# Patient Record
Sex: Female | Born: 1960
Health system: Southern US, Community
[De-identification: ages and names within clinical notes are randomized; demographics above are authoritative.]

## PROBLEM LIST (undated history)

## (undated) DIAGNOSIS — R42 Dizziness and giddiness: Secondary | ICD-10-CM

## (undated) DIAGNOSIS — G4700 Insomnia, unspecified: Secondary | ICD-10-CM

## (undated) DIAGNOSIS — F329 Major depressive disorder, single episode, unspecified: Secondary | ICD-10-CM

## (undated) DIAGNOSIS — K589 Irritable bowel syndrome without diarrhea: Secondary | ICD-10-CM

## (undated) DIAGNOSIS — J302 Other seasonal allergic rhinitis: Secondary | ICD-10-CM

## (undated) DIAGNOSIS — I10 Essential (primary) hypertension: Secondary | ICD-10-CM

## (undated) DIAGNOSIS — M797 Fibromyalgia: Secondary | ICD-10-CM

## (undated) DIAGNOSIS — E0590 Thyrotoxicosis, unspecified without thyrotoxic crisis or storm: Secondary | ICD-10-CM

## (undated) DIAGNOSIS — G8929 Other chronic pain: Secondary | ICD-10-CM

## (undated) DIAGNOSIS — S99199A Other physeal fracture of unspecified metatarsal, initial encounter for closed fracture: Secondary | ICD-10-CM

## (undated) DIAGNOSIS — K219 Gastro-esophageal reflux disease without esophagitis: Secondary | ICD-10-CM

## (undated) DIAGNOSIS — Z889 Allergy status to unspecified drugs, medicaments and biological substances status: Secondary | ICD-10-CM

## (undated) DIAGNOSIS — R000 Tachycardia, unspecified: Secondary | ICD-10-CM

## (undated) DIAGNOSIS — J40 Bronchitis, not specified as acute or chronic: Secondary | ICD-10-CM

## (undated) DIAGNOSIS — R413 Other amnesia: Secondary | ICD-10-CM

## (undated) DIAGNOSIS — F419 Anxiety disorder, unspecified: Secondary | ICD-10-CM

## (undated) DIAGNOSIS — Z8719 Personal history of other diseases of the digestive system: Secondary | ICD-10-CM

## (undated) DIAGNOSIS — K056 Periodontal disease, unspecified: Secondary | ICD-10-CM

## (undated) DIAGNOSIS — E049 Nontoxic goiter, unspecified: Secondary | ICD-10-CM

## (undated) DIAGNOSIS — F32A Depression, unspecified: Secondary | ICD-10-CM

## (undated) DIAGNOSIS — M1990 Unspecified osteoarthritis, unspecified site: Secondary | ICD-10-CM

## (undated) DIAGNOSIS — R51 Headache: Secondary | ICD-10-CM

## (undated) DIAGNOSIS — G4730 Sleep apnea, unspecified: Secondary | ICD-10-CM

## (undated) DIAGNOSIS — D649 Anemia, unspecified: Secondary | ICD-10-CM

## (undated) DIAGNOSIS — T8484XA Pain due to internal orthopedic prosthetic devices, implants and grafts, initial encounter: Secondary | ICD-10-CM

## (undated) DIAGNOSIS — T148XXA Other injury of unspecified body region, initial encounter: Secondary | ICD-10-CM

## (undated) DIAGNOSIS — R0602 Shortness of breath: Secondary | ICD-10-CM

## (undated) DIAGNOSIS — S93439A Sprain of tibiofibular ligament of unspecified ankle, initial encounter: Secondary | ICD-10-CM

## (undated) DIAGNOSIS — I8390 Asymptomatic varicose veins of unspecified lower extremity: Secondary | ICD-10-CM

## (undated) DIAGNOSIS — M549 Dorsalgia, unspecified: Secondary | ICD-10-CM

## (undated) DIAGNOSIS — S82831A Other fracture of upper and lower end of right fibula, initial encounter for closed fracture: Secondary | ICD-10-CM

## (undated) DIAGNOSIS — S82301A Unspecified fracture of lower end of right tibia, initial encounter for closed fracture: Secondary | ICD-10-CM

## (undated) HISTORY — DX: Fibromyalgia: M79.7

## (undated) HISTORY — PX: SHOULDER ARTHROSCOPY W/ ROTATOR CUFF REPAIR: SHX2400

## (undated) HISTORY — PX: OTHER SURGICAL HISTORY: SHX169

## (undated) HISTORY — DX: Bronchitis, not specified as acute or chronic: J40

## (undated) HISTORY — PX: CERVICAL DISC SURGERY: SHX588

## (undated) HISTORY — DX: Other seasonal allergic rhinitis: J30.2

## (undated) HISTORY — PX: HERNIA REPAIR: SHX51

## (undated) HISTORY — DX: Other physeal fracture of unspecified metatarsal, initial encounter for closed fracture: S99.199A

## (undated) HISTORY — PX: SHOULDER SURGERY: SHX246

## (undated) HISTORY — PX: TUBAL LIGATION: SHX77

## (undated) HISTORY — PX: PARTIAL HYSTERECTOMY: SHX80

## (undated) HISTORY — DX: Gastro-esophageal reflux disease without esophagitis: K21.9

## (undated) HISTORY — PX: UTERINE FIBROID SURGERY: SHX826

---

## 1998-01-27 ENCOUNTER — Other Ambulatory Visit: Admission: RE | Admit: 1998-01-27 | Discharge: 1998-01-27 | Payer: Self-pay | Admitting: Obstetrics and Gynecology

## 1999-06-07 ENCOUNTER — Other Ambulatory Visit: Admission: RE | Admit: 1999-06-07 | Discharge: 1999-06-07 | Payer: Self-pay | Admitting: Obstetrics and Gynecology

## 1999-10-23 ENCOUNTER — Encounter: Admission: RE | Admit: 1999-10-23 | Discharge: 1999-10-23 | Payer: Self-pay | Admitting: Gastroenterology

## 1999-10-23 ENCOUNTER — Encounter: Payer: Self-pay | Admitting: Gastroenterology

## 2001-02-25 ENCOUNTER — Other Ambulatory Visit: Admission: RE | Admit: 2001-02-25 | Discharge: 2001-02-25 | Payer: Self-pay | Admitting: Obstetrics and Gynecology

## 2002-09-25 ENCOUNTER — Emergency Department (HOSPITAL_COMMUNITY): Admission: EM | Admit: 2002-09-25 | Discharge: 2002-09-25 | Payer: Self-pay | Admitting: Emergency Medicine

## 2002-09-25 ENCOUNTER — Encounter: Payer: Self-pay | Admitting: Emergency Medicine

## 2002-10-07 HISTORY — PX: SPINE SURGERY: SHX786

## 2002-10-08 ENCOUNTER — Other Ambulatory Visit: Admission: RE | Admit: 2002-10-08 | Discharge: 2002-10-08 | Payer: Self-pay | Admitting: Obstetrics and Gynecology

## 2003-02-18 ENCOUNTER — Encounter: Payer: Self-pay | Admitting: Neurosurgery

## 2003-02-18 ENCOUNTER — Ambulatory Visit (HOSPITAL_COMMUNITY): Admission: RE | Admit: 2003-02-18 | Discharge: 2003-02-19 | Payer: Self-pay | Admitting: Neurosurgery

## 2003-10-08 HISTORY — PX: KNEE SURGERY: SHX244

## 2003-11-18 ENCOUNTER — Other Ambulatory Visit: Admission: RE | Admit: 2003-11-18 | Discharge: 2003-11-18 | Payer: Self-pay | Admitting: Obstetrics and Gynecology

## 2003-12-29 ENCOUNTER — Encounter: Admission: RE | Admit: 2003-12-29 | Discharge: 2003-12-29 | Payer: Self-pay | Admitting: Gastroenterology

## 2004-01-09 ENCOUNTER — Encounter: Admission: RE | Admit: 2004-01-09 | Discharge: 2004-01-09 | Payer: Self-pay | Admitting: Gastroenterology

## 2005-10-07 HISTORY — PX: TONSILLECTOMY: SUR1361

## 2005-10-07 HISTORY — PX: NASAL SEPTOPLASTY W/ TURBINOPLASTY: SHX2070

## 2006-03-13 ENCOUNTER — Ambulatory Visit (HOSPITAL_COMMUNITY): Admission: RE | Admit: 2006-03-13 | Discharge: 2006-03-13 | Payer: Self-pay | Admitting: Obstetrics and Gynecology

## 2006-04-25 ENCOUNTER — Encounter (INDEPENDENT_AMBULATORY_CARE_PROVIDER_SITE_OTHER): Payer: Self-pay | Admitting: Specialist

## 2006-04-25 ENCOUNTER — Ambulatory Visit (HOSPITAL_COMMUNITY): Admission: RE | Admit: 2006-04-25 | Discharge: 2006-04-25 | Payer: Self-pay | Admitting: Obstetrics and Gynecology

## 2006-05-05 ENCOUNTER — Encounter (INDEPENDENT_AMBULATORY_CARE_PROVIDER_SITE_OTHER): Payer: Self-pay | Admitting: *Deleted

## 2006-05-05 ENCOUNTER — Other Ambulatory Visit: Admission: RE | Admit: 2006-05-05 | Discharge: 2006-05-05 | Payer: Self-pay | Admitting: Interventional Radiology

## 2006-05-05 ENCOUNTER — Encounter: Admission: RE | Admit: 2006-05-05 | Discharge: 2006-05-05 | Payer: Self-pay | Admitting: Surgery

## 2007-07-20 ENCOUNTER — Ambulatory Visit: Payer: Self-pay

## 2010-03-14 ENCOUNTER — Encounter: Admission: RE | Admit: 2010-03-14 | Discharge: 2010-03-14 | Payer: Self-pay | Admitting: General Practice

## 2010-05-08 ENCOUNTER — Ambulatory Visit (HOSPITAL_BASED_OUTPATIENT_CLINIC_OR_DEPARTMENT_OTHER): Admission: RE | Admit: 2010-05-08 | Discharge: 2010-05-08 | Payer: Self-pay | Admitting: Orthopedic Surgery

## 2010-05-08 HISTORY — PX: FRACTURE SURGERY: SHX138

## 2010-11-26 ENCOUNTER — Other Ambulatory Visit: Payer: Self-pay | Admitting: Orthopedic Surgery

## 2010-11-26 DIAGNOSIS — R609 Edema, unspecified: Secondary | ICD-10-CM

## 2010-11-26 DIAGNOSIS — M79605 Pain in left leg: Secondary | ICD-10-CM

## 2010-11-27 ENCOUNTER — Ambulatory Visit
Admission: RE | Admit: 2010-11-27 | Discharge: 2010-11-27 | Disposition: A | Discharge status: Home / Self Care | Payer: No Typology Code available for payment source | Source: Ambulatory Visit | Attending: Orthopedic Surgery | Admitting: Orthopedic Surgery

## 2010-11-27 DIAGNOSIS — M79605 Pain in left leg: Secondary | ICD-10-CM

## 2010-11-27 DIAGNOSIS — R609 Edema, unspecified: Secondary | ICD-10-CM

## 2010-12-20 ENCOUNTER — Other Ambulatory Visit (HOSPITAL_COMMUNITY): Payer: Self-pay | Admitting: Sports Medicine

## 2010-12-20 DIAGNOSIS — M5126 Other intervertebral disc displacement, lumbar region: Secondary | ICD-10-CM

## 2010-12-21 LAB — POCT HEMOGLOBIN-HEMACUE: Hemoglobin: 12.4 g/dL (ref 12.0–15.0)

## 2010-12-23 ENCOUNTER — Ambulatory Visit (HOSPITAL_COMMUNITY)
Admission: RE | Admit: 2010-12-23 | Discharge: 2010-12-23 | Disposition: A | Discharge status: Home / Self Care | Payer: No Typology Code available for payment source | Source: Ambulatory Visit | Attending: Sports Medicine | Admitting: Sports Medicine

## 2010-12-23 DIAGNOSIS — M545 Low back pain, unspecified: Secondary | ICD-10-CM | POA: Insufficient documentation

## 2010-12-23 DIAGNOSIS — M48061 Spinal stenosis, lumbar region without neurogenic claudication: Secondary | ICD-10-CM | POA: Insufficient documentation

## 2010-12-23 DIAGNOSIS — M5126 Other intervertebral disc displacement, lumbar region: Secondary | ICD-10-CM | POA: Insufficient documentation

## 2011-02-22 NOTE — Op Note (Signed)
   NAME:  [name], [name]                         ACCOUNT NO.:  [id number]   MEDICAL RECORD NO.:  [id number]                   PATIENT TYPE:  OIB   LOCATION:  3013                                 FACILITY:  [facility]   PHYSICIAN:  Coletta Memos, M.D.                  DATE OF BIRTH:  02-Mar-1961   DATE OF PROCEDURE:  02/18/2003  DATE OF DISCHARGE:                                 OPERATIVE REPORT   PREOPERATIVE DIAGNOSIS:  Displaced disk at C6-7 left.   POSTOPERATIVE DIAGNOSIS:  Displaced disk at C6-7 left.   PROCEDURES:  1. Anterior cervical decompression, C6-7.  2. Anterior cervical arthrodesis, C6-7, with allograft.  3. Anterior plating, Synthes small-stature plate.   INDICATIONS:  The patient is a 50 year old who presented with significant  pain in the upper extremity secondary to a large herniated disk at C6-7 on  the left side.  I recommended and she agreed to undergo operative  decompression.   DESCRIPTION OF PROCEDURE:  The patient was brought to the operating room,  intubated, placed under general anesthesia without difficulty.  Her head was  placed in slight extension on a horseshoe head rest with 10 pounds of  traction applied via chin strap.  She had Kerlix bandages placed around her  wrists to pull on the wrists for localization.  Her neck was prepped.  She  was draped in sterile fashion and I infiltrated 4 mL 0.5% lidocaine with  1:200,000 strength epinephrine into the cervical region starting in the  midline, extending to the medial border of the left sternocleidomastoid  muscle.  I opened the skin with a #10 blade and took this down to the  thoracolumbar fascia.   DICTATION ENDED AT THIS POINT                                                Coletta Memos, M.D.    KC/MEDQ  D:  02/18/2003  T:  02/19/2003  Job:  [id number]

## 2011-02-22 NOTE — H&P (Signed)
NAMEMATTY, [name]               ACCOUNT NO.:  [id number]   MEDICAL RECORD NO.:  [id number]          PATIENT TYPE:  AMB   LOCATION:  SDC                           FACILITY:  [facility]   PHYSICIAN:  Juluis Mire, M.D.   DATE OF BIRTH:  17-Jun-1961   DATE OF ADMISSION:  04/25/2006  DATE OF DISCHARGE:                                HISTORY & PHYSICAL   A 50 year old gravida 1, para 1 female presents for hysteroscopy along with  diagnostic laparoscopy with laser standby and bilateral tubal ligation.   In relation to the present admission, patient's cycles are present five to  seven days.  She changes pads and tampons approximately every four to six  hours.  No significant dysmenorrhea.  Because of this she underwent a saline  infusion ultrasound with finding of some endometrial polyps as well as a  right ovarian cyst.  In view of this we are going to proceed with  hysteroscopic resection.  She is desires of permanent sterilization,  therefore will proceed with laparoscopic bilateral tubal ligation.  We  discussed the potential irreversibility of sterilization.  Alternative forms  of birth control have been discussed.  Failure rate of 1 in 200 was quoted.  This can be in the form of ectopic pregnancy requiring further surgical  management.   ALLERGIES:  No known drug allergies.   MEDICATIONS:  Celebrex, Xanax, Flexeril, Benadryl.   PAST MEDICAL HISTORY:  Patient has had usual childhood diseases without any  significant sequelae, history of bronchitis as well as asthma in the past.   PAST SURGICAL HISTORY:  She has had a prior cesarean section and previous  tonsillectomy.   SOCIAL HISTORY:  No tobacco, alcohol use.   FAMILY HISTORY:  Father with a history of strokes, otherwise unremarkable.   REVIEW OF SYSTEMS:  Noncontributory.   PHYSICAL EXAMINATION:  VITAL SIGNS:  Patient is afebrile with stable vital  signs.  HEENT:  Patient normocephalic.  Pupils are equal, round, and reactive  to  light and accommodation.  Extraocular movements were intact.  Sclerae and  conjunctivae clear.  Oropharynx clear.  NECK:  Thyroid nodule under evaluation.  BREASTS:  No dominant masses.  LUNGS:  Clear.  CARDIAC:  Regular rate.  No murmurs or gallops.  ABDOMEN:  Benign.  No mass, organomegaly, or tenderness.  PELVIC:  Normal external genitalia.  Vaginal mucosa clear.  Cervix  unremarkable.  Uterus normal size, shape, and contour.  Adnexa free of mass  or tenderness.  EXTREMITIES:  Trace edema.  NEUROLOGIC:  Grossly within normal limits.   IMPRESSION:  1.  Menorrhagia with endometrial polyp.  2.  Multiparity, desires sterility.  3.  The patient will undergo diagnostic laparoscopy with bilateral tubal      ligation.  She will follow up with hysteroscopic resection of a polyp.      The risks of surgery have been discussed including the risk of      infection, the risk of hemorrhage that could require transfusion, the      risk of AIDS or hepatitis, the risk of injury to adjacent organs  including bladder, bowel, or ureters that could require further      exploratory surgery, risk of deep venous thrombosis and pulmonary      embolus.  Patient expressed understanding of indications and risks.      Juluis Mire, M.D.  Electronically Signed     JSM/MEDQ  D:  04/25/2006  T:  04/25/2006  Job:  [id number]

## 2011-02-22 NOTE — Op Note (Signed)
   NAME:  [name], [name]                         ACCOUNT NO.:  [id number]   MEDICAL RECORD NO.:  [id number]                   PATIENT TYPE:  OIB   LOCATION:  3013                                 FACILITY:  [facility]   PHYSICIAN:  Coletta Memos, M.D.                  DATE OF BIRTH:  May 29, 1961   DATE OF PROCEDURE:  DATE OF DISCHARGE:                                 OPERATIVE REPORT   This patient, after having the skin opened, I dissected down to the platysma  and opened that in a horizontal fashion.  I then dissected above and below  the platysma both rostrally and caudally.  I identified the medial strap  muscles and the sternocleidomastoid.  I also identified the omohyoid.  Leaving that lateral, I then pulled the medial strap muscles medially and  was able to place a spinal needle at C5-C6, as it was difficult to see C6-C7  secondary to his shoulders, and I was able to identify the level.  I then  opened the C6-C7 disk space.  I used a Leksell rongeur to remove anterior  osteophyte present on C6 and C7.  I then placed self-retaining Kaspar  retractors after reflecting the longus colli muscles.  Once I was done, I  brought the microscope in and used a high-speed air drill, curettes, and  Kerrison punches along with Dr. Newell Coral, removed the disk at C6-C7 and  clearly identified the C7 nerve roots bilaterally.  After that was done and  bleeding was controlled, I irrigated.  I then placed an 8-mm MCF bone graft.  When that was placed, I removed the traction and also the distraction pins  which I had placed prior to the dissection of the disk space at C6 and C7.  I then placed a small stature Synthes 16-mm plate, two screws in C6, two  screws in C7.  X-ray was taken and showed the screws at C6 to be in good  position, could not see C7.  I then close the wound in layered fashion using  Vicryl sutures reapproximating the platysma and the subcutaneous tissues.  Dermabond was used for a sterile  dressing.  The patient tolerated the  procedure well and was moving all extremities postoperatively.                                               Coletta Memos, M.D.    KC/MEDQ  D:  02/18/2003  T:  02/19/2003  Job:  [id number]

## 2011-02-22 NOTE — Op Note (Signed)
[name], [name]               ACCOUNT NO.:  [id number]   MEDICAL RECORD NO.:  [id number]          PATIENT TYPE:  AMB   LOCATION:  SDC                           [facility]   PHYSICIAN:  Juluis Mire, M.D.   DATE OF BIRTH:  1961-04-21   DATE OF PROCEDURE:  04/25/2006  DATE OF DISCHARGE:                                 OPERATIVE REPORT   PREOPERATIVE DIAGNOSES:  1.  Abnormal uterine bleeding with endometrial polyps.  2.  Multiparity, desires sterility.  3.  Ovarian cyst.   POSTOPERATIVE DIAGNOSES:  1.  Multiple endometrial polyps.  2.  Pelvic adhesions.   OPERATIVE PROCEDURE:  1.  Hysteroscopy with removal of multiple polyps with the resectoscope.  2.  Subsequent endometrial biopsies.  3.  Endometrial curettings.  4.  Diagnostic laparoscopy.  5.  Lysis of adhesions.  6.  Bilateral tubal fulguration.   SURGEON:  Juluis Mire, M.D.   ANESTHESIA:  General.   ESTIMATED BLOOD LOSS:  Minimal.   PACKS AND DRAINS:  None.   INTRAOPERATIVE BLOOD REPLACED:  None.   COMPLICATIONS:  None.   INDICATION:  Are dictated in the History and Physical.   PROCEDURE AS FOLLOWS:  The patient taken to the OR and placed in supine  position.  After a satisfactory level of general endotracheal anesthesia was  obtained, the patient was placed in the dorsal lithotomy position using  Allen stirrups.  The abdomen, perineum and vagina were cleaned out with an  antiseptic solution.  The patient was then draped out for a hysteroscopy.  A  speculum was placed in the vaginal vault.  The cervix was grasped with a  single tooth tenaculum.  A paracervical block was instituted using 1%  Nesacaine.  The uterus sounded to 10 cm.  The cervix was sterilely dilated  to a size 35 Pratt dilator.  Operative hysteroscope was introduced in the  intrauterine cavity.  The intrauterine cavity was distended using sorbitol.  Visualization revealed multiple large polyps, these were all resected and  sent for  pathological review.  We had completely cleaned out the endometrial  cavity.  Multiple endometrial biopsies were obtained of one of those  curettings, total deficit was 70 mL.  There was no signs of perforation,  complication or active bleeding.  The hysteroscope was then removed, Hulka  tenaculum was put in place, single tooth tenaculum and speculum then  removed.  Bladder __________ catheterization.   A subumbilical incision was made with a knife.  The Veress needle was  introduced in the abdominal cavity.  Abdomen was insufflated to  approximately 2.5L of carbon dioxide.  Operative laparoscope was introduced.  Visualization revealed __________ adjacent organs.  Appendix was visualized,  noted to be normal.  Upper abdomen including liver, tip of gallbladder were  cleared, both lateral gutters were clear.  She did have omental adhesions to  the anterior uterus and bladder area.  This was taken down using cautery and  scissors.  A 5 mm trocar was then put in place under direct visualization.  We were able to manipulate the uterus, tubes  and ovaries.  There was no  evidence of any other pelvic pathology.  Both ovaries appeared to be normal,  no ovarian cysts were noted.  At this point in time, both tubes were  coagulated for a distance of approximately 2.5 cm, coagulation extended into  the mesosalpinx, the same segment of tube was recoagulated, completely  desiccating both segments of tubes.  Both tubes were adequately coagulated,  there was no active bleeding or sign of complications.  The abdomen was  deflated with carbon dioxide, all trocars were removed.  The subumbilical  incision was closed with interrupted subcuticulars with #4-0 Vicryl,  __________ incisions were closed with Dermabond.  The Hulka tenaculum was  then removed.  The patient taken out of the dorsal supine position, was  alert and extubated.  Transferred to the recovery room in good condition.  Sponge, instrument and  needle count reported as correct by circulating  nurse.      Juluis Mire, M.D.  Electronically Signed     JSM/MEDQ  D:  04/25/2006  T:  04/25/2006  Job:  [id number]

## 2011-08-12 ENCOUNTER — Encounter: Payer: Self-pay | Admitting: Vascular Surgery

## 2011-08-19 ENCOUNTER — Encounter: Payer: Self-pay | Admitting: Vascular Surgery

## 2011-08-20 ENCOUNTER — Encounter: Payer: Self-pay | Admitting: Vascular Surgery

## 2011-08-20 ENCOUNTER — Ambulatory Visit (INDEPENDENT_AMBULATORY_CARE_PROVIDER_SITE_OTHER): Payer: Medicaid Other | Admitting: Vascular Surgery

## 2011-08-20 VITALS — BP 149/93 | HR 103 | Resp 20 | Ht 62.0 in | Wt 239.0 lb

## 2011-08-20 DIAGNOSIS — Z8249 Family history of ischemic heart disease and other diseases of the circulatory system: Secondary | ICD-10-CM

## 2011-08-20 DIAGNOSIS — IMO0002 Reserved for concepts with insufficient information to code with codable children: Secondary | ICD-10-CM

## 2011-08-20 NOTE — Progress Notes (Signed)
The patient presents today for insertion of anterior exposure for L5-S1 disc disease. She has been seen in preoperative consultation with Dr. Franky Macho. He is recommended anterior approach for L5-S1 lumbar fusion. She is here today discussed my role anterior exposure. She reports bilateral extremity weakness and back discomfort. She has multiple medical problems including fibromyalgia. She is disabled. She does report a prior event of superficial thrombophlebitis and some small tributary varicosities around the level of her lateral knee on the left. She does not have any history of DVT and has never been on Coumadin therapy. There is some possibility that her mother had a pulmonary embolus. Apparently she died following knee surgery in unclear circumstances.  Past Medical History  Diagnosis Date  . Asthma   . Bronchitis   . Jones fracture     left foot fifth metatarsal  . GERD (gastroesophageal reflux disease)   . Fibromyalgia     History  Substance Use Topics  . Smoking status: Never Smoker   . Smokeless tobacco: Not on file  . Alcohol Use: No    Family History  Problem Relation Age of Onset  . Stroke Father     Allergies  Allergen Reactions  . Shellfish Allergy Anaphylaxis    Current outpatient prescriptions:ALPRAZolam (XANAX PO), Take by mouth.  , Disp: , Rfl: ;  aspirin 81 MG tablet, Take 81 mg by mouth daily.  , Disp: , Rfl: ;  Celecoxib (CELEBREX PO), Take by mouth.  , Disp: , Rfl: ;  Cyclobenzaprine HCl (FLEXERIL PO), Take by mouth.  , Disp: , Rfl: ;  DiphenhydrAMINE HCl (BENADRYL PO), Take by mouth.  , Disp: , Rfl:   BP 149/93  Pulse 103  Resp 20  Ht 5\' 2"  (1.575 m)  Wt 239 lb (108.41 kg)  BMI 43.71 kg/m2  SpO2 97%  Body mass index is 43.71 kg/(m^2).       Review of systems: Weight gain. GI reflux hiatal hernia and constipation. Neurologic dizziness blackouts and headaches. Pulmonary asthma. Hematologic anemia. GU urinary frequency. ENT change in eyesight.  Musculoskeletal arthritis joint pain muscle pain. Psychiatric depression and anxiety.  Physical exam Olen well-developed obese black female in no acute distress. Weight is reported at 239 pounds. She is 5 foot 2 inches tall. She has 2+ radial and 2+ dorsalis pedis pulses bilaterally. Shoney's are no major deformities or cyanosis per neurologic no focal weakness first paresthesias. Skin without ulcers or rashes. Heart regular rate and rhythm. HEENT is normal. Abdominal exam reveals no tenderness no masses she does have obesity.  Impression and plan: Degenerative disc disease with recommendation for L5 S1 ALIF. I discussed lateral and exposure to include mobilization of the intraperitoneal contents left ureter and iliac arteries and veins. I explained potential for injury of all these. Expander with her obesity would make exposure somewhat more difficult a prior C-section there may be some adhesions but this should not preclude [place] from the procedure. He understands and wished to proceed we will coordinate this with Dr. Franky Macho.

## 2011-08-22 ENCOUNTER — Encounter: Payer: Self-pay | Admitting: Vascular Surgery

## 2011-09-06 ENCOUNTER — Encounter (HOSPITAL_COMMUNITY): Payer: Self-pay | Admitting: Pharmacy Technician

## 2011-09-16 ENCOUNTER — Encounter (HOSPITAL_COMMUNITY)
Admission: RE | Admit: 2011-09-16 | Discharge: 2011-09-16 | Disposition: A | Discharge status: Home / Self Care | Payer: Medicaid Other | Source: Ambulatory Visit | Attending: Neurosurgery | Admitting: Neurosurgery

## 2011-09-16 ENCOUNTER — Encounter (HOSPITAL_COMMUNITY): Payer: Self-pay

## 2011-09-16 ENCOUNTER — Other Ambulatory Visit: Payer: Self-pay

## 2011-09-16 HISTORY — DX: Other chronic pain: G89.29

## 2011-09-16 HISTORY — DX: Dizziness and giddiness: R42

## 2011-09-16 HISTORY — DX: Other injury of unspecified body region, initial encounter: T14.8XXA

## 2011-09-16 HISTORY — DX: Anxiety disorder, unspecified: F41.9

## 2011-09-16 HISTORY — DX: Major depressive disorder, single episode, unspecified: F32.9

## 2011-09-16 HISTORY — DX: Allergy status to unspecified drugs, medicaments and biological substances: Z88.9

## 2011-09-16 HISTORY — DX: Irritable bowel syndrome, unspecified: K58.9

## 2011-09-16 HISTORY — DX: Depression, unspecified: F32.A

## 2011-09-16 HISTORY — DX: Unspecified osteoarthritis, unspecified site: M19.90

## 2011-09-16 HISTORY — DX: Anemia, unspecified: D64.9

## 2011-09-16 HISTORY — DX: Periodontal disease, unspecified: K05.6

## 2011-09-16 HISTORY — DX: Thyrotoxicosis, unspecified without thyrotoxic crisis or storm: E05.90

## 2011-09-16 HISTORY — DX: Other amnesia: R41.3

## 2011-09-16 HISTORY — DX: Asymptomatic varicose veins of unspecified lower extremity: I83.90

## 2011-09-16 HISTORY — DX: Shortness of breath: R06.02

## 2011-09-16 HISTORY — DX: Insomnia, unspecified: G47.00

## 2011-09-16 HISTORY — DX: Headache: R51

## 2011-09-16 HISTORY — DX: Dorsalgia, unspecified: M54.9

## 2011-09-16 LAB — BASIC METABOLIC PANEL
BUN: 11 mg/dL (ref 6–23)
CO2: 26 mEq/L (ref 19–32)
Calcium: 9.9 mg/dL (ref 8.4–10.5)
Chloride: 107 mEq/L (ref 96–112)
Creatinine, Ser: 0.64 mg/dL (ref 0.50–1.10)
GFR calc Af Amer: >90 mL/min (ref >90)
GFR calc non Af Amer: >90 mL/min (ref >90)
Glucose, Bld: 88 mg/dL (ref 70–99)
Potassium: 4.9 mEq/L (ref 3.5–5.1)
Sodium: 141 mEq/L (ref 135–145)

## 2011-09-16 LAB — SURGICAL PCR SCREEN
MRSA, PCR: NEGATIVE
Staphylococcus aureus: NEGATIVE

## 2011-09-16 LAB — CBC
HCT: 37.8 % (ref 36.0–46.0)
Hemoglobin: 12.1 g/dL (ref 12.0–15.0)
MCH: 25.5 pg — ABNORMAL LOW (ref 26.0–34.0)
MCHC: 32 g/dL (ref 30.0–36.0)
MCV: 79.6 fL (ref 78.0–100.0)
Platelets: 327 10*3/uL (ref 150–400)
RBC: 4.75 MIL/uL (ref 3.87–5.11)
RDW: 15.4 % (ref 11.5–15.5)
WBC: 10.8 10*3/uL — ABNORMAL HIGH (ref 4.0–10.5)

## 2011-09-16 LAB — TYPE AND SCREEN
ABO/RH(D): B POS
Antibody Screen: NEGATIVE

## 2011-09-16 LAB — ABO/RH: ABO/RH(D): B POS

## 2011-09-16 LAB — HCG, SERUM, QUALITATIVE: Preg, Serum: NEGATIVE

## 2011-09-16 NOTE — Pre-Procedure Instructions (Signed)
20 Christyl S Taulbee  09/16/2011   Your procedure is scheduled on:  Fri,Dec 14 @ 0730 Report to [facility] at 0530 AM.  Call this number if you have problems the morning of surgery: [phone]   Remember:   Do not eat food:After Midnight.  May have clear liquids: up to 4 Hours before arrival.(until 1:30am)  Clear liquids include soda, tea, black coffee, apple or grape juice, broth.  Take these medicines the morning of surgery with A SIP OF WATER: Celebrex,Gabapentin,Pain Pill(if needed),Ativan   Do not wear jewelry, make-up or nail polish.  Do not wear lotions, powders, or perfumes. You may wear deodorant.  Do not shave 48 hours prior to surgery.  Do not bring valuables to the hospital.  Contacts, dentures or bridgework may not be worn into surgery.  Leave suitcase in the car. After surgery it may be brought to your room.  For patients admitted to the hospital, checkout time is 11:00 AM the day of discharge.   Patients discharged the day of surgery will not be allowed to drive home.  Name and phone number of your driver:   Special Instructions: CHG Shower Use Special Wash: 1/2 bottle night before surgery and 1/2 bottle morning of surgery.   Please read over the following fact sheets that you were given: Pain Booklet, Coughing and Deep Breathing, Blood Transfusion Information, MRSA Information and Surgical Site Infection Prevention

## 2011-09-16 NOTE — Progress Notes (Signed)
Pt states pressures are always high but when she goes to medical MD it is always fine;Medical MD is Dr.Blunt De.Peglo on [employer]

## 2011-09-16 NOTE — Progress Notes (Signed)
Pt states she had a stress test back in high school

## 2011-09-20 ENCOUNTER — Encounter (HOSPITAL_COMMUNITY): Payer: Self-pay | Admitting: Anesthesiology

## 2011-09-20 ENCOUNTER — Inpatient Hospital Stay (HOSPITAL_COMMUNITY): Payer: Medicaid Other

## 2011-09-20 ENCOUNTER — Encounter (HOSPITAL_COMMUNITY): Payer: Self-pay | Admitting: *Deleted

## 2011-09-20 ENCOUNTER — Encounter (HOSPITAL_COMMUNITY)
Admission: RE | Disposition: A | Discharge status: Home / Self Care | Payer: Self-pay | Source: Ambulatory Visit | Attending: Neurosurgery

## 2011-09-20 ENCOUNTER — Other Ambulatory Visit: Payer: Self-pay

## 2011-09-20 ENCOUNTER — Inpatient Hospital Stay (HOSPITAL_COMMUNITY): Payer: Medicaid Other | Admitting: Anesthesiology

## 2011-09-20 ENCOUNTER — Inpatient Hospital Stay (HOSPITAL_COMMUNITY)
Admission: RE | Admit: 2011-09-20 | Discharge: 2011-09-26 | DRG: 460 | Disposition: A | Discharge status: Home / Self Care | Payer: Medicaid Other | Source: Ambulatory Visit | Attending: Neurosurgery | Admitting: Neurosurgery

## 2011-09-20 DIAGNOSIS — M5137 Other intervertebral disc degeneration, lumbosacral region: Secondary | ICD-10-CM | POA: Diagnosis present

## 2011-09-20 DIAGNOSIS — I519 Heart disease, unspecified: Secondary | ICD-10-CM | POA: Diagnosis not present

## 2011-09-20 DIAGNOSIS — R000 Tachycardia, unspecified: Secondary | ICD-10-CM | POA: Diagnosis not present

## 2011-09-20 DIAGNOSIS — M51379 Other intervertebral disc degeneration, lumbosacral region without mention of lumbar back pain or lower extremity pain: Secondary | ICD-10-CM | POA: Diagnosis present

## 2011-09-20 DIAGNOSIS — M51369 Other intervertebral disc degeneration, lumbar region without mention of lumbar back pain or lower extremity pain: Secondary | ICD-10-CM | POA: Diagnosis present

## 2011-09-20 DIAGNOSIS — Z01812 Encounter for preprocedural laboratory examination: Secondary | ICD-10-CM

## 2011-09-20 DIAGNOSIS — I10 Essential (primary) hypertension: Secondary | ICD-10-CM | POA: Diagnosis present

## 2011-09-20 DIAGNOSIS — IMO0001 Reserved for inherently not codable concepts without codable children: Secondary | ICD-10-CM | POA: Diagnosis present

## 2011-09-20 DIAGNOSIS — IMO0002 Reserved for concepts with insufficient information to code with codable children: Secondary | ICD-10-CM

## 2011-09-20 DIAGNOSIS — K219 Gastro-esophageal reflux disease without esophagitis: Secondary | ICD-10-CM | POA: Diagnosis present

## 2011-09-20 DIAGNOSIS — G8929 Other chronic pain: Secondary | ICD-10-CM | POA: Diagnosis present

## 2011-09-20 DIAGNOSIS — M5136 Other intervertebral disc degeneration, lumbar region: Secondary | ICD-10-CM | POA: Diagnosis present

## 2011-09-20 DIAGNOSIS — M5126 Other intervertebral disc displacement, lumbar region: Principal | ICD-10-CM | POA: Diagnosis present

## 2011-09-20 HISTORY — DX: Essential (primary) hypertension: I10

## 2011-09-20 HISTORY — PX: ANTERIOR LUMBAR FUSION: SHX1170

## 2011-09-20 SURGERY — ANTERIOR LUMBAR FUSION 1 LEVEL
Anesthesia: General | Site: Abdomen | Wound class: Clean

## 2011-09-20 MED ORDER — SODIUM CHLORIDE 0.9 % IV SOLN: 250.0000 mL | INTRAVENOUS | Status: DC

## 2011-09-20 MED ORDER — HYDROMORPHONE HCL PF 1 MG/ML IJ SOLN
INTRAMUSCULAR | Status: AC
  Administered 2011-09-20: 0.5 mg via INTRAVENOUS
  Filled 2011-09-20: qty 1

## 2011-09-20 MED ORDER — OXYCODONE-ACETAMINOPHEN 5-325 MG PO TABS
1.0000 | ORAL_TABLET | ORAL | Status: DC | PRN
  Administered 2011-09-20 – 2011-09-23 (×13): 2 via ORAL
  Administered 2011-09-23 (×3): 1 via ORAL
  Administered 2011-09-24 – 2011-09-26 (×9): 2 via ORAL
  Filled 2011-09-20 (×4): qty 2
  Filled 2011-09-20: qty 1
  Filled 2011-09-20 (×6): qty 2
  Filled 2011-09-20: qty 1
  Filled 2011-09-20 (×8): qty 2
  Filled 2011-09-20: qty 1
  Filled 2011-09-20 (×3): qty 2

## 2011-09-20 MED ORDER — CEFAZOLIN SODIUM 1-5 GM-% IV SOLN: 1.0000 g | INTRAVENOUS | Status: DC

## 2011-09-20 MED ORDER — BACITRACIN ZINC 500 UNIT/GM EX OINT
TOPICAL_OINTMENT | CUTANEOUS | Status: DC | PRN
  Administered 2011-09-20: 1 via TOPICAL

## 2011-09-20 MED ORDER — 0.9 % SODIUM CHLORIDE (POUR BTL) OPTIME
TOPICAL | Status: DC | PRN
  Administered 2011-09-20: 1000 mL

## 2011-09-20 MED ORDER — MEPERIDINE HCL 25 MG/ML IJ SOLN: 6.2500 mg | INTRAMUSCULAR | Status: DC | PRN

## 2011-09-20 MED ORDER — GLYCOPYRROLATE 0.2 MG/ML IJ SOLN
INTRAMUSCULAR | Status: DC | PRN
  Administered 2011-09-20: .8 mg via INTRAVENOUS

## 2011-09-20 MED ORDER — MIDAZOLAM HCL 2 MG/2ML IJ SOLN: 0.5000 mg | Freq: Once | INTRAMUSCULAR | Status: DC | PRN

## 2011-09-20 MED ORDER — MIDAZOLAM HCL 5 MG/5ML IJ SOLN
INTRAMUSCULAR | Status: DC | PRN
  Administered 2011-09-20 (×2): 1 mg via INTRAVENOUS

## 2011-09-20 MED ORDER — MORPHINE SULFATE 4 MG/ML IJ SOLN
1.0000 mg | INTRAMUSCULAR | Status: DC | PRN
  Administered 2011-09-20: 4 mg via INTRAVENOUS
  Administered 2011-09-22: 2 mg via INTRAVENOUS
  Filled 2011-09-20 (×3): qty 1

## 2011-09-20 MED ORDER — DIAZEPAM 5 MG PO TABS
5.0000 mg | ORAL_TABLET | Freq: Four times a day (QID) | ORAL | Status: DC | PRN
  Administered 2011-09-20 (×2): 5 mg via ORAL
  Filled 2011-09-20: qty 1

## 2011-09-20 MED ORDER — ONDANSETRON HCL 4 MG/2ML IJ SOLN
INTRAMUSCULAR | Status: DC | PRN
  Administered 2011-09-20: 4 mg via INTRAVENOUS

## 2011-09-20 MED ORDER — PROMETHAZINE HCL 25 MG/ML IJ SOLN: 6.2500 mg | INTRAMUSCULAR | Status: DC | PRN

## 2011-09-20 MED ORDER — ACETAMINOPHEN 325 MG PO TABS: 650.0000 mg | ORAL_TABLET | ORAL | Status: DC | PRN

## 2011-09-20 MED ORDER — POTASSIUM CHLORIDE IN NACL 20-0.9 MEQ/L-% IV SOLN
INTRAVENOUS | Status: DC
  Administered 2011-09-20: 19:00:00 via INTRAVENOUS
  Filled 2011-09-20 (×13): qty 1000

## 2011-09-20 MED ORDER — HYDROMORPHONE HCL PF 1 MG/ML IJ SOLN
0.2500 mg | INTRAMUSCULAR | Status: DC | PRN
  Administered 2011-09-20 (×4): 0.5 mg via INTRAVENOUS

## 2011-09-20 MED ORDER — PROPOFOL 10 MG/ML IV EMUL
INTRAVENOUS | Status: DC | PRN
  Administered 2011-09-20: 150 mg via INTRAVENOUS
  Administered 2011-09-20: 50 mg via INTRAVENOUS

## 2011-09-20 MED ORDER — ACETAMINOPHEN 650 MG RE SUPP: 650.0000 mg | RECTAL | Status: DC | PRN

## 2011-09-20 MED ORDER — NEOSTIGMINE METHYLSULFATE 1 MG/ML IJ SOLN
INTRAMUSCULAR | Status: DC | PRN
  Administered 2011-09-20: 5 mg via INTRAVENOUS

## 2011-09-20 MED ORDER — ZOLPIDEM TARTRATE 10 MG PO TABS
10.0000 mg | ORAL_TABLET | Freq: Every evening | ORAL | Status: DC | PRN
  Administered 2011-09-21 – 2011-09-25 (×6): 10 mg via ORAL
  Filled 2011-09-20 (×6): qty 1

## 2011-09-20 MED ORDER — CEFAZOLIN SODIUM 1-5 GM-% IV SOLN
1.0000 g | Freq: Three times a day (TID) | INTRAVENOUS | Status: AC
  Administered 2011-09-20 – 2011-09-21 (×2): 1 g via INTRAVENOUS
  Filled 2011-09-20 (×2): qty 50

## 2011-09-20 MED ORDER — OXYCODONE-ACETAMINOPHEN 5-325 MG PO TABS
ORAL_TABLET | ORAL | Status: AC
  Filled 2011-09-20: qty 2

## 2011-09-20 MED ORDER — PHENYLEPHRINE HCL 10 MG/ML IJ SOLN
10.0000 mg | INTRAVENOUS | Status: DC | PRN
  Administered 2011-09-20: 25 ug/min via INTRAVENOUS

## 2011-09-20 MED ORDER — GABAPENTIN 300 MG PO CAPS
300.0000 mg | ORAL_CAPSULE | Freq: Three times a day (TID) | ORAL | Status: DC
  Administered 2011-09-20 – 2011-09-26 (×18): 300 mg via ORAL
  Filled 2011-09-20 (×20): qty 1

## 2011-09-20 MED ORDER — VECURONIUM BROMIDE 10 MG IV SOLR
INTRAVENOUS | Status: DC | PRN
  Administered 2011-09-20: 4 mg via INTRAVENOUS
  Administered 2011-09-20 (×2): 3 mg via INTRAVENOUS
  Administered 2011-09-20 (×2): 2 mg via INTRAVENOUS

## 2011-09-20 MED ORDER — SODIUM CHLORIDE 0.9 % IJ SOLN
3.0000 mL | Freq: Two times a day (BID) | INTRAMUSCULAR | Status: DC
  Administered 2011-09-21 – 2011-09-26 (×10): 3 mL via INTRAVENOUS

## 2011-09-20 MED ORDER — PHENOL 1.4 % MT LIQD: 1.0000 | OROMUCOSAL | Status: DC | PRN

## 2011-09-20 MED ORDER — ROCURONIUM BROMIDE 100 MG/10ML IV SOLN
INTRAVENOUS | Status: DC | PRN
  Administered 2011-09-20: 50 mg via INTRAVENOUS

## 2011-09-20 MED ORDER — CEFAZOLIN SODIUM-DEXTROSE 2-3 GM-% IV SOLR
2.0000 g | INTRAVENOUS | Status: AC
  Administered 2011-09-20: 2 g via INTRAVENOUS
  Filled 2011-09-20: qty 50

## 2011-09-20 MED ORDER — HEMOSTATIC AGENTS (NO CHARGE) OPTIME
TOPICAL | Status: DC | PRN
  Administered 2011-09-20: 1 via TOPICAL

## 2011-09-20 MED ORDER — HYDROCHLOROTHIAZIDE 25 MG PO TABS
25.0000 mg | ORAL_TABLET | Freq: Every day | ORAL | Status: DC
  Administered 2011-09-20 – 2011-09-26 (×7): 25 mg via ORAL
  Filled 2011-09-20 (×7): qty 1

## 2011-09-20 MED ORDER — MENTHOL 3 MG MT LOZG: 1.0000 | LOZENGE | OROMUCOSAL | Status: DC | PRN

## 2011-09-20 MED ORDER — LACTATED RINGERS IV SOLN
INTRAVENOUS | Status: DC | PRN
  Administered 2011-09-20 (×3): via INTRAVENOUS

## 2011-09-20 MED ORDER — DIPHENHYDRAMINE HCL 50 MG/ML IJ SOLN: 25.0000 mg | Freq: Four times a day (QID) | INTRAMUSCULAR | Status: DC | PRN

## 2011-09-20 MED ORDER — THROMBIN 20000 UNITS EX KIT
PACK | CUTANEOUS | Status: DC | PRN
  Administered 2011-09-20: 20000 [IU] via TOPICAL

## 2011-09-20 MED ORDER — LORAZEPAM 1 MG PO TABS: 1.0000 mg | ORAL_TABLET | Freq: Four times a day (QID) | ORAL | Status: DC | PRN

## 2011-09-20 MED ORDER — DIAZEPAM 5 MG PO TABS
ORAL_TABLET | ORAL | Status: AC
  Filled 2011-09-20: qty 1

## 2011-09-20 MED ORDER — LACTATED RINGERS IV SOLN
INTRAVENOUS | Status: DC | PRN
  Administered 2011-09-20 (×2): via INTRAVENOUS

## 2011-09-20 MED ORDER — FENTANYL CITRATE 0.05 MG/ML IJ SOLN
INTRAMUSCULAR | Status: DC | PRN
  Administered 2011-09-20: 50 ug via INTRAVENOUS
  Administered 2011-09-20: 100 ug via INTRAVENOUS
  Administered 2011-09-20 (×2): 50 ug via INTRAVENOUS
  Administered 2011-09-20: 100 ug via INTRAVENOUS
  Administered 2011-09-20 (×6): 50 ug via INTRAVENOUS
  Administered 2011-09-20: 100 ug via INTRAVENOUS

## 2011-09-20 MED ORDER — ONDANSETRON HCL 4 MG/2ML IJ SOLN: 4.0000 mg | INTRAMUSCULAR | Status: DC | PRN

## 2011-09-20 MED ORDER — ALUM & MAG HYDROXIDE-SIMETH 400-400-40 MG/5ML PO SUSP
30.0000 mL | Freq: Four times a day (QID) | ORAL | Status: DC | PRN
  Filled 2011-09-20: qty 30

## 2011-09-20 MED ORDER — DIPHENHYDRAMINE HCL 25 MG PO CAPS
25.0000 mg | ORAL_CAPSULE | Freq: Four times a day (QID) | ORAL | Status: DC | PRN
  Administered 2011-09-22 – 2011-09-26 (×5): 25 mg via ORAL
  Filled 2011-09-20 (×5): qty 1

## 2011-09-20 MED ORDER — SODIUM CHLORIDE 0.9 % IJ SOLN
3.0000 mL | INTRAMUSCULAR | Status: DC | PRN
  Administered 2011-09-22: 3 mL via INTRAVENOUS

## 2011-09-20 MED ORDER — PHENYLEPHRINE HCL 10 MG/ML IJ SOLN
INTRAMUSCULAR | Status: DC | PRN
  Administered 2011-09-20 (×2): 40 ug via INTRAVENOUS

## 2011-09-20 SURGICAL SUPPLY — 103 items
ADH SKN CLS APL DERMABOND .7 (GAUZE/BANDAGES/DRESSINGS)
APPLIER CLIP 11 MED OPEN (CLIP) ×2
APR CLP MED 11 20 MLT OPN (CLIP) ×1
BUR MATCHSTICK NEURO 3.0 LAGG (BURR) ×1 IMPLANT
CANISTER SUCTION 2500CC (MISCELLANEOUS) ×2 IMPLANT
CLIP APPLIE 11 MED OPEN (CLIP) IMPLANT
CLOTH BEACON ORANGE TIMEOUT ST (SAFETY) ×3 IMPLANT
CONT SPEC 4OZ CLIKSEAL STRL BL (MISCELLANEOUS) ×2 IMPLANT
COVER BACK TABLE 24X17X13 BIG (DRAPES) IMPLANT
COVER TABLE BACK 60X90 (DRAPES) ×1 IMPLANT
DECANTER SPIKE VIAL GLASS SM (MISCELLANEOUS) ×1 IMPLANT
DERMABOND ADVANCED (GAUZE/BANDAGES/DRESSINGS)
DERMABOND ADVANCED .7 DNX12 (GAUZE/BANDAGES/DRESSINGS) ×1 IMPLANT
DRAPE C-ARM 42X72 X-RAY (DRAPES) ×4 IMPLANT
DRAPE INCISE 23X17 IOBAN STRL (DRAPES) ×1
DRAPE INCISE 23X17 STRL (DRAPES) IMPLANT
DRAPE INCISE IOBAN 23X17 STRL (DRAPES) ×1 IMPLANT
DRAPE INCISE IOBAN 66X45 STRL (DRAPES) IMPLANT
DRAPE LAPAROTOMY 100X72X124 (DRAPES) ×2 IMPLANT
DRAPE POUCH INSTRU U-SHP 10X18 (DRAPES) ×2 IMPLANT
DRESSING TELFA 8X3 (GAUZE/BANDAGES/DRESSINGS) ×1 IMPLANT
ELECT BLADE 4.0 EZ CLEAN MEGAD (MISCELLANEOUS) ×2
ELECT REM PT RETURN 9FT ADLT (ELECTROSURGICAL) ×2
ELECTRODE BLDE 4.0 EZ CLN MEGD (MISCELLANEOUS) IMPLANT
ELECTRODE REM PT RTRN 9FT ADLT (ELECTROSURGICAL) ×1 IMPLANT
EXTENDED BOVIE TIP ×1 IMPLANT
GAUZE SPONGE 4X4 16PLY XRAY LF (GAUZE/BANDAGES/DRESSINGS) IMPLANT
GLOVE BIO SURGEON STRL SZ 6.5 (GLOVE) IMPLANT
GLOVE BIO SURGEON STRL SZ7 (GLOVE) IMPLANT
GLOVE BIO SURGEON STRL SZ7.5 (GLOVE) IMPLANT
GLOVE BIO SURGEON STRL SZ8 (GLOVE) IMPLANT
GLOVE BIO SURGEON STRL SZ8.5 (GLOVE) IMPLANT
GLOVE BIOGEL M 8.0 STRL (GLOVE) IMPLANT
GLOVE BIOGEL PI IND STRL 7.0 (GLOVE) IMPLANT
GLOVE BIOGEL PI IND STRL 8.5 (GLOVE) IMPLANT
GLOVE BIOGEL PI INDICATOR 7.0 (GLOVE) ×2
GLOVE BIOGEL PI INDICATOR 8.5 (GLOVE) ×1
GLOVE ECLIPSE 6.5 STRL STRAW (GLOVE) ×1 IMPLANT
GLOVE ECLIPSE 7.0 STRL STRAW (GLOVE) IMPLANT
GLOVE ECLIPSE 7.5 STRL STRAW (GLOVE) IMPLANT
GLOVE ECLIPSE 8.0 STRL XLNG CF (GLOVE) IMPLANT
GLOVE ECLIPSE 8.5 STRL (GLOVE) IMPLANT
GLOVE EXAM NITRILE LRG STRL (GLOVE) IMPLANT
GLOVE EXAM NITRILE MD LF STRL (GLOVE) IMPLANT
GLOVE EXAM NITRILE XL STR (GLOVE) IMPLANT
GLOVE EXAM NITRILE XS STR PU (GLOVE) IMPLANT
GLOVE INDICATOR 6.5 STRL GRN (GLOVE) IMPLANT
GLOVE INDICATOR 7.0 STRL GRN (GLOVE) IMPLANT
GLOVE INDICATOR 7.5 STRL GRN (GLOVE) IMPLANT
GLOVE INDICATOR 8.0 STRL GRN (GLOVE) IMPLANT
GLOVE INDICATOR 8.5 STRL (GLOVE) IMPLANT
GLOVE OPTIFIT SS 8.0 STRL (GLOVE) IMPLANT
GLOVE SS BIOGEL STRL SZ 7.5 (GLOVE) ×1 IMPLANT
GLOVE SUPERSENSE BIOGEL SZ 7.5 (GLOVE)
GLOVE SURG SS PI 6.5 STRL IVOR (GLOVE) ×5 IMPLANT
GLOVE SURG SS PI 7.5 STRL IVOR (GLOVE) ×1 IMPLANT
GLOVE SURG SS PI 8.0 STRL IVOR (GLOVE) ×1 IMPLANT
GOWN BRE IMP SLV AUR LG STRL (GOWN DISPOSABLE) ×3 IMPLANT
GOWN BRE IMP SLV AUR XL STRL (GOWN DISPOSABLE) ×2 IMPLANT
GOWN STRL NON-REIN LRG LVL3 (GOWN DISPOSABLE) ×1 IMPLANT
GOWN STRL REIN 2XL LVL4 (GOWN DISPOSABLE) IMPLANT
GRANULE CHRONOS MED 1.4-2.8MM (Orthopedic Implant) ×1 IMPLANT
INSERT FOGARTY 61MM (MISCELLANEOUS) IMPLANT
INSERT FOGARTY SM (MISCELLANEOUS) IMPLANT
KIT BASIN OR (CUSTOM PROCEDURE TRAY) ×2 IMPLANT
KIT INFUSE X SMALL 1.4CC (Orthopedic Implant) ×1 IMPLANT
KIT ROOM TURNOVER OR (KITS) ×3 IMPLANT
LOOP VESSEL MAXI BLUE (MISCELLANEOUS) IMPLANT
LOOP VESSEL MINI RED (MISCELLANEOUS) IMPLANT
NDL SPNL 18GX3.5 QUINCKE PK (NEEDLE) IMPLANT
NEEDLE HYPO 22GX1.5 SAFETY (NEEDLE) ×1 IMPLANT
NEEDLE SPNL 18GX3.5 QUINCKE PK (NEEDLE) ×2 IMPLANT
NS IRRIG 1000ML POUR BTL (IV SOLUTION) ×2 IMPLANT
PACK LAMINECTOMY NEURO (CUSTOM PROCEDURE TRAY) ×2 IMPLANT
PAD ARMBOARD 7.5X6 YLW CONV (MISCELLANEOUS) ×5 IMPLANT
PEEK SYNFIX 30X38X8 (Peek) ×1 IMPLANT
SCREW LOCK FINE TIP 25MM (Screw) ×4 IMPLANT
SPONGE GAUZE 4X4 12PLY (GAUZE/BANDAGES/DRESSINGS) ×2 IMPLANT
SPONGE INTESTINAL PEANUT (DISPOSABLE) ×3 IMPLANT
SPONGE LAP 18X18 X RAY DECT (DISPOSABLE) ×1 IMPLANT
SPONGE LAP 4X18 X RAY DECT (DISPOSABLE) IMPLANT
SPONGE SURGIFOAM ABS GEL 100 (HEMOSTASIS) ×1 IMPLANT
STAPLER SKIN PROX WIDE 3.9 (STAPLE) ×1 IMPLANT
STAPLER VISISTAT 35W (STAPLE) IMPLANT
SUT PROLENE 4 0 RB 1 (SUTURE)
SUT PROLENE 4-0 RB1 .5 CRCL 36 (SUTURE) ×4 IMPLANT
SUT SILK 0 TIES 10X30 (SUTURE) ×2 IMPLANT
SUT SILK 2 0 TIES 10X30 (SUTURE) ×3 IMPLANT
SUT SILK 3 0 TIES 10X30 (SUTURE) ×1 IMPLANT
SUT VIC AB 0 CT1 18XCR BRD8 (SUTURE) ×1 IMPLANT
SUT VIC AB 0 CT1 27 (SUTURE) ×4
SUT VIC AB 0 CT1 27XBRD ANBCTR (SUTURE) ×2 IMPLANT
SUT VIC AB 0 CT1 8-18 (SUTURE)
SUT VIC AB 2-0 CT1 18 (SUTURE) ×1 IMPLANT
SUT VIC AB 3-0 SH 8-18 (SUTURE) ×1 IMPLANT
SUT VICRYL 4-0 PS2 18IN ABS (SUTURE) ×1 IMPLANT
SYR 20ML ECCENTRIC (SYRINGE) ×1 IMPLANT
TAPE CLOTH SURG 4X10 WHT LF (GAUZE/BANDAGES/DRESSINGS) ×1 IMPLANT
TOWEL OR 17X24 6PK STRL BLUE (TOWEL DISPOSABLE) ×3 IMPLANT
TOWEL OR 17X26 10 PK STRL BLUE (TOWEL DISPOSABLE) ×3 IMPLANT
TRAY FOLEY CATH 14FRSI W/METER (CATHETERS) ×2 IMPLANT
TUBE CONNECTING 12X1/4 (SUCTIONS) ×1 IMPLANT
WATER STERILE IRR 1000ML POUR (IV SOLUTION) ×2 IMPLANT

## 2011-09-20 NOTE — Transfer of Care (Signed)
Immediate Anesthesia Transfer of Care Note  Patient: [name]  Procedure(s) Performed:  ANTERIOR LUMBAR FUSION 1 LEVEL - Lumbar five-Sacral One Anterior Lumbar Interbody Fusion /Dr. Early to Approach ; ABDOMINAL EXPOSURE - Anterior Exposure   Patient Location: PACU  Anesthesia Type: General  Level of Consciousness: awake and sedated  Airway & Oxygen Therapy: Patient Spontanous Breathing and Patient connected to face mask oxygen  Post-op Assessment: Report given to PACU RN, Post -op Vital signs reviewed and stable and Patient moving all extremities  Post vital signs: Reviewed and stable  Complications: No apparent anesthesia complications

## 2011-09-20 NOTE — Progress Notes (Signed)
Subjective: Patient reports burning pain in the left lower extremity. Back and buttocks pain also.   Objective: Vital signs in last 24 hours: Temp:  [97.5 F (36.4 C)-98.2 F (36.8 C)] 97.6 F (36.4 C) (12/14 1945) Pulse Rate:  [97-111] 111  (12/14 1945) Resp:  [18-34] 18  (12/14 1945) BP: (116-161)/(67-99) 125/81 mmHg (12/14 1945) SpO2:  [96 %-100 %] 98 % (12/14 1945)  Intake/Output from previous day:   Intake/Output this shift:    Alert and oriented x4. Moving lower extremities well. Normal muscle tone and bulk.  Lab Results: No results found for this basename: WBC:2,HGB:2,HCT:2,PLT:2 in the last 72 hours BMET No results found for this basename: NA:2,K:2,CL:2,CO2:2,GLUCOSE:2,BUN:2,CREATININE:2,CALCIUM:2 in the last 72 hours  Studies/Results: Dg Chest 2 View  09/20/2011  *RADIOLOGY REPORT*  Clinical Data: Preop evaluation for lumbar surgery.  CHEST - 2 VIEW  Comparison: None.  Findings: Two views of the chest were obtained.  The lungs are clear bilaterally.  Normal appearance of the heart and mediastinum. Status post fusion in the lower cervical spine.  No evidence for edema or pleural effusions.  IMPRESSION: No acute chest findings.  Original Report Authenticated By: Richarda Overlie, M.D.   Dg Lumbar Spine 2-3 Views  09/20/2011  *RADIOLOGY REPORT*  Clinical Data: L5-S1 disc protrusion.  LUMBAR SPINE - 2-3 VIEW  Comparison: MRI dated 12/23/2010  Findings: AP and lateral C-arm images demonstrate the patient has undergone anterior interbody fusion at L5-S1.  Hardware appears in good position with anatomic alignment of the L5 and S1 segments.  IMPRESSION: The anterior fusion performed at L5-S1.  Original Report Authenticated By: Gwynn Burly, M.D.   Dg C-arm Gt 120 Min  09/20/2011  CLINICAL DATA: Anterior lumbar interbody fusion   C-ARM GT 120 MIN  Fluoroscopy was utilized by the requesting physician.  No radiographic  interpretation.     Dg Or Local Abdomen  09/20/2011   *RADIOLOGY REPORT*  Clinical Data: Anterior lumbar fusion.  Incorrect instrument count.  OR LOCAL ABDOMEN  Comparison: None.  Findings: The patient status post anterior and at L5-S1.  No unexpected radiopaque foreign body is identified.  IMPRESSION: Negative for unexpected foreign body.  Original Report Authenticated By: Bernadene Bell. Maricela Curet, M.D.    Assessment/Plan: Doing well post op. Will start pt tomorrow.   LOS: 0 days     Jaydon Avina L 09/20/2011, 9:35 PM

## 2011-09-20 NOTE — Consult Note (Signed)
[name]   08/20/2011 2:30 PM Office Visit  MRN: [id number]   Description: 50 year old female  Provider: Larina Earthly, MD  Department: [facility]        Diagnoses     Displacement of intervertebral disc, site unspecified, without myelopathy   - Primary    722.2    Family history of pulmonary embolism     V17.49      Reason for Visit     New Evaluation    REF->> Dr. Franky [name]     Pt is scheduled for ALIF on 09-20-11      L5-S1 levels.        Vitals - Last Recorded       BP Pulse Resp Ht Wt BMI    149/93  103  20  5\' 2"  (1.575 m)  239 lb (108.41 kg)  43.71 kg/m2          SpO2              97%                 Progress Notes     [name],Jill F, MD  08/20/2011  5:39 PM  Signed The patient presents today for insertion of anterior exposure for L5-S1 disc disease. She has been seen in preoperative consultation with Dr. Franky [name]. He is recommended anterior approach for L5-S1 lumbar fusion. She is here today discussed my role anterior exposure. She reports bilateral extremity weakness and back discomfort. She has multiple medical problems including fibromyalgia. She is disabled. She does report a prior event of superficial thrombophlebitis and some small tributary varicosities around the level of her lateral knee on the left. She does not have any history of DVT and has never been on Coumadin therapy. There is some possibility that her mother had a pulmonary embolus. Apparently she died following knee surgery in unclear circumstances.    Past Medical History   Diagnosis  Date   .  Asthma     .  Bronchitis     .  Jones fracture         left foot fifth metatarsal   .  GERD (gastroesophageal reflux disease)     .  Fibromyalgia         History   Substance Use Topics   .  Smoking status:  Never Smoker    .  Smokeless tobacco:  Not on file   .  Alcohol Use:  No       Family History   Problem  Relation  Age of Onset   .  Stroke  Father         Allergies     Allergen  Reactions   .  Shellfish Allergy  Anaphylaxis      Current outpatient prescriptions:ALPRAZolam (XANAX PO), Take by mouth.  , Disp: , Rfl: ;  aspirin 81 MG tablet, Take 81 mg by mouth daily.  , Disp: , Rfl: ;  Celecoxib (CELEBREX PO), Take by mouth.  , Disp: , Rfl: ;  Cyclobenzaprine HCl (FLEXERIL PO), Take by mouth.  , Disp: , Rfl: ;  DiphenhydrAMINE HCl (BENADRYL PO), Take by mouth. , Disp: , Rfl:    BP 149/93  Pulse 103  Resp 20  Ht 5\' 2"  (1.575 m)  Wt 239 lb (108.41 kg)  BMI 43.71 kg/m2  SpO2 97%   Body mass index is 43.71 kg/(m^2).             Review of  systems: Weight gain. GI reflux hiatal hernia and constipation. Neurologic dizziness blackouts and headaches. Pulmonary asthma. Hematologic anemia. GU urinary frequency. ENT change in eyesight. Musculoskeletal arthritis joint pain muscle pain. Psychiatric depression and anxiety.   Physical exam [name] well-developed obese black female in no acute distress. Weight is reported at 239 pounds. She is 5 foot 2 inches tall. She has 2+ radial and 2+ dorsalis pedis pulses bilaterally. [name]'s are no major deformities or cyanosis per neurologic no focal weakness first paresthesias. Skin without ulcers or rashes. Heart regular rate and rhythm. HEENT is normal. Abdominal exam reveals no tenderness no masses she does have obesity.   Impression and plan: Degenerative disc disease with recommendation for L5 S1 ALIF. I discussed lateral and exposure to include mobilization of the intraperitoneal contents left ureter and iliac arteries and veins. I explained potential for injury of all these. Expander with her obesity would make exposure somewhat more difficult a prior C-section there may be some adhesions but this should not preclude [place] from the procedure. He understands and wished to proceed we will coordinate this with Dr. Franky [name].     Electronic signature on 08/20/2011       [name]. Early,MD    09/20/2011  See above  consult 08/20/11  No change in H&P.  Reexamined and ready for OR  Jill Earthly, MD

## 2011-09-20 NOTE — Anesthesia Preprocedure Evaluation (Addendum)
Anesthesia Evaluation  Patient identified by MRN, date of birth, ID band Patient awake and Patient confused    Reviewed: Allergy & Precautions, H&P , NPO status , Patient's Chart, lab work & pertinent test results  Airway Mallampati: II TM Distance: >3 FB Neck ROM: Full    Dental  (+) Dental Advisory Given and Missing   Pulmonary asthma ,  clear to auscultation        Cardiovascular hypertension, Regular Normal    Neuro/Psych  Headaches,    GI/Hepatic GERD-  Medicated and Controlled,  Endo/Other    Renal/GU      Musculoskeletal  (+) Fibromyalgia -  Abdominal (+) obese,   Peds  Hematology   Anesthesia Other Findings   Reproductive/Obstetrics                         Anesthesia Physical Anesthesia Plan  ASA: III  Anesthesia Plan: General   Post-op Pain Management:    Induction: Intravenous  Airway Management Planned: Oral ETT  Additional Equipment: CVP  Intra-op Plan:   Post-operative Plan: Extubation in OR  Informed Consent: I have reviewed the patients History and Physical, chart, labs and discussed the procedure including the risks, benefits and alternatives for the proposed anesthesia with the patient or authorized representative who has indicated his/her understanding and acceptance.   Dental advisory given  Plan Discussed with: CRNA, Surgeon and Anesthesiologist  Anesthesia Plan Comments:        Anesthesia Quick Evaluation

## 2011-09-20 NOTE — Op Note (Signed)
09/20/2011  12:38 PM  PATIENT:  [name]  50 y.o. female  PRE-OPERATIVE DIAGNOSIS:  lumbar herniated disc/DDD L5-S1  POST-OPERATIVE DIAGNOSIS:  Lumbar Herniated Nucleous Pulposus, Degenerarive Disc Disease L5-S1  PROCEDURE:  Procedure(s): ANTERIOR LUMBAR FUSION 1 LEVEL L5/S1 Centrex BMP extra small and morselized allograft ABDOMINAL EXPOSURE  SURGEON:  Surgeon(s): Danny Lawless, MD Carmela Hurt  ASSISTANTS:Stern  ANESTHESIA:   general  EBL:  Total I/O In: 3000 [I.V.:3000] Out: 750 [Urine:500; Blood:250]  BLOOD ADMINISTERED:none  CELL SAVER GIVEN:none  COUNT:correct  DRAINS: none   SPECIMEN:  No Specimen  DICTATION: Under separate cover Dr. Tawanna Cooler will dictate his abdominal approach after exposure of the lumbosacral spine I placed a spinal needle in what I believe to be the space at L5-S1. Using fluoroscopy were able to positively identify the disc space as L5-S1. I then proceeded with decompression of the spinal canal via an anterior approach along with a subsequent arthrodesis of the L5-S1 levels.  I opened the disc space with a #15 blade in a progressive fashion removed disc cartilage between L5 and S1. In a sterile meticulous manner I was able to the eventually used the ligamentous attachments the L5 and S1. I was able to work towards the spinal canal using a drill Kerrison punches correct pituitary rongeurs specimen had a fairly large osteophyte posteriorly which I was able to get behind into release to some degree. And achieve decompression of the spinal canal via this approach. I did opened the annulus of the disc space at L5-S1 posteriorly. I controlled epidural bleeding using Gelfoam and 7 pressure.  Referring decompression of the spinal canal being done I prepared the endplates a vertebral bodies of L5 and S1 for arthrodesis and then size spacers finally placed a synfix device 13.5 large footplate by 12 and then placed 4 screws 2 screws into L5-2  screws at S1. I x-rayed the device was slightly skewed towards the left side was in place securely. The cage was packed with bmp and morcelized allograft.  I was able to distract the L5-S1 interspace the radio compared to his preoperative state. The space of 5 and was collapsed and markedly degenerated. I wound. I then closed the wound after removing the Thompson retractor was in place. I closed the wound in layered fashion reapproximating the anterior rectus sheath subcutaneous tissue and subcutaneous and skin edges Staples to reapproximate the skin edges. Dressing was applied. Final x-ray was taken and showed no retained hardware instruments counts were correct his total to me by the nursing staff. 5. Patient was extubated and moving all extremities.  PLAN OF CARE: Admit to inpatient   PATIENT DISPOSITION:  PACU - hemodynamically stable.   Delay start of Pharmacological VTE agent (>24hrs) due to surgical blood loss or risk of bleeding:  yes

## 2011-09-20 NOTE — Preoperative (Signed)
Beta Blockers   Reason not to administer Beta Blockers:Not Applicable 

## 2011-09-20 NOTE — H&P (Addendum)
[name] is an 50 y.o. female.   Chief Complaint: low back pain  HPI: chronic low back pain. Full regimen of conservative treatment without longstAnding benefit. ddd in the lumbar spine.   Past Medical History  Diagnosis Date  . Bronchitis   . Jones fracture     left foot fifth metatarsal  . Fibromyalgia   . Varicose vein     protrudes above skin-per pt;vein popped and bruised;ultrasound done to make sure that there were no clots;noclots were found  . Chronic back pain   . Multiple allergies   . Asthma     but pt states not severe enough to even have an inhaler  . Shortness of breath     with exertion;pt states that its related to her weight  . Hyperthyroidism     but pt doesn't take anything for it;thyroid nodules  . Headache   . Dizziness     rarely  . Impaired memory   . Joint pain   . Joint swelling   . GERD (gastroesophageal reflux disease)     doesn't take anything for it  . IBS (irritable bowel syndrome)   . Arthritis     bilateral knees  . Bruising     pt states unexplained d/t fibromyalgia  . Urinary frequency   . Increased thirst   . Anemia   . Anxiety     takes Ativan and Valium  . Depression     from Fibromyalgia diagnosis  . Insomnia     takes Ambien  . Sore gums     this is why pt is on Amoxil-only takes for dental work  . Hypertension     recently diagnosed (this month)    Past Surgical History  Procedure Date  . Cesarean section 1984  . Spine surgery 2004    Cervical plate, ACDF  . Knee surgery 2005    Left knee arthroscopy  . Nasal septoplasty w/ turbinoplasty 2007  . Tonsillectomy 2007  . Fracture surgery 05/08/2010    Jones fracture left foot fifth metatarsal  . Abdominal hysterectomy     partial hysterectomy -mid 2000's    Family History  Problem Relation Age of Onset  . Stroke Father   . Anesthesia problems Neg Hx   . Hypotension Neg Hx   . Malignant hyperthermia Neg Hx   . Pseudochol deficiency Neg Hx    Social History:   reports that she has never smoked. She does not have any smokeless tobacco history on file. She reports that she does not drink alcohol or use illicit drugs.  Allergies:  Allergies  Allergen Reactions  . Shellfish Allergy Anaphylaxis  . Latex Other (See Comments)    Itching/rash  . Lortab Itching  . Other Other (See Comments)    MSG-    Medications Prior to Admission  Medication Dose Route Frequency Provider Last Rate Last Dose  . ceFAZolin (ANCEF) IVPB 2 g/50 mL premix  2 g Intravenous 60 min Pre-Op Elinda Bunten L Derriona Branscom      . DISCONTD: ceFAZolin (ANCEF) IVPB 1 g/50 mL premix  1 g Intravenous 60 min Pre-Op Angeles Zehner L Aracelia Brinson       Medications Prior to Admission  Medication Sig Dispense Refill  . celecoxib (CELEBREX) 200 MG capsule Take 200 mg by mouth daily.        . cyclobenzaprine (FLEXERIL) 10 MG tablet Take 10 mg by mouth 3 (three) times daily.        . hydrochlorothiazide (HYDRODIURIL)  25 MG tablet Take 25 mg by mouth daily. One tablet by mouth in the morning.       Marland Kitchen OVER THE COUNTER MEDICATION Take 2 tablets by mouth every 6 (six) hours as needed. For allergy and sinus pressure [employer]         No results found for this or any previous visit (from the past 48 hour(s)). Dg Chest 2 View  09/20/2011  *RADIOLOGY REPORT*  Clinical Data: Preop evaluation for lumbar surgery.  CHEST - 2 VIEW  Comparison: None.  Findings: Two views of the chest were obtained.  The lungs are clear bilaterally.  Normal appearance of the heart and mediastinum. Status post fusion in the lower cervical spine.  No evidence for edema or pleural effusions.  IMPRESSION: No acute chest findings.  Original Report Authenticated By: Richarda Overlie, M.D.  MRI is reviewed.  What it shows is significant degeneration in the lumbar spine.  She has very small disc protrusion and there is very little compression of the neural element.  She did undergo left S1 transforaminal epidurals which did not give her any significant  relief.     Review of Systems  HENT:       Sinus headaches Thyroid disease  Respiratory:       Asthma  Musculoskeletal:       Leg pain Back pain, arm  Pain, joint pain Neck pain  Neurological:       Fainting spells Difficulty with speech Memory difficulties   Psychiatric/Behavioral: Negative.     Blood pressure 125/94, pulse 102, temperature 98.1 F (36.7 C), temperature source Oral, resp. rate 20, SpO2 98.00%. Physical Exam  Constitutional: She is oriented to person, place, and time. She appears well-developed and well-nourished.  HENT:  Head: Normocephalic and atraumatic.  Eyes: Conjunctivae and EOM are normal. Pupils are equal, round, and reactive to light.  Neck: Normal range of motion.  Cardiovascular: Normal rate, regular rhythm and normal heart sounds.   Respiratory: Effort normal and breath sounds normal.  Musculoskeletal: Normal range of motion.  Neurological: She is alert and oriented to person, place, and time. She has normal strength. No cranial nerve deficit or sensory deficit. She displays a negative Romberg sign.   On examination she is alert, oriented x 4 and answering all questions appropriately.  She has 1+ reflexes at the knees, 0 at the ankles, 2+ in the upper extremities.  Normal muscle tone, bulk and coordination.  She has a normal gait.  5/5 strength in the upper and lower extremities.  Pupils are equal, round and reactive to light.  Full extraocular movements.  Full visual fields.  Hearing intact to finger rub.  Symmetric facial sensation and movement.  Uvula elevates in the midline.  Shoulder shrug is normal.  Tongue protrudes in the midline.  There are no cervical masses or bruits.  Lung fields clear.  Heart regular rhythm and rate.  No murmurs or rubs.  Pulse is good at the wrists bilaterally.  Oral mucosa is normal.  Head normocephalic, atraumatic.  No clubbing, cyanosis or edema.    Assessment/Plan Or for alif. She has a degenerated disc.  She has an  HNP there, seemingly a lumbar radiculopathy and spondylosis.  I think she might do well with an ALIF at L5-S1.  I think that the discs and the neural elements can be decompressed anteriorly.  I think this is an easier approach for her and this is what I would propose at this point in  time.  She has gone through all conservative treatments and she still feels that she is in a significant amount of pain.  Given that, this is what I would have her do.  I gave her a detailed instruction sheet about lumbar fusions.     Marcelina Mclaurin L 09/20/2011, 7:38 AM

## 2011-09-20 NOTE — Anesthesia Postprocedure Evaluation (Signed)
  Anesthesia Post-op Note  Patient: [name]  Procedure(s) Performed:  ANTERIOR LUMBAR FUSION 1 LEVEL - Lumbar five-Sacral One Anterior Lumbar Interbody Fusion /Dr. Early to Approach ; ABDOMINAL EXPOSURE - Anterior Exposure   Patient Location: PACU  Anesthesia Type: General  Level of Consciousness: awake  Airway and Oxygen Therapy: Patient Spontanous Breathing  Post-op Pain: mild  Post-op Assessment: Post-op Vital signs reviewed  Post-op Vital Signs: stable  Complications: No apparent anesthesia complications

## 2011-09-20 NOTE — Op Note (Signed)
OPERATIVE REPORT  DATE OF SURGERY: 09/20/2011  PATIENT: [name], 49 y.o. female MRN: [id number]  DOB: [birth year]  PRE-OPERATIVE DIAGNOSIS: L5-S1 disc disease  POST-OPERATIVE DIAGNOSIS:  Same  PROCEDURE: ALIF L5 S1  SURGEON:  Gretta Began, M.D.  Co-surgeon: Dr. Mikal Plane  ANESTHESIA:  Gen.  EBL: 200 cc ml  Total I/O In: 3710 [I.V.:3710] Out: 900 [Urine:650; Blood:250]  BLOOD ADMINISTERED: None none  DRAINS: None  SPECIMEN: None  COUNTS CORRECT:  YES  PLAN OF CARE: PACU   PATIENT DISPOSITION:  PACU - hemodynamically stable  PROCEDURE DETAILS: Patient was taken to the operating room and placed in supine position where the abdomen the and draped in usual sterile fashion. Lateral C-arm projection revealed the level of L5-S1. Scissors made below the midline laterally to the left. This was carried down to the fat with electrocautery to the level of the fascia. The fat was mobilized off the fascia for ventral closure. The patient was morbidly obese. Incision in the fascia was made in line with the skin incision and exposed the rectus muscle. The rectus muscle was mobilized circumferentially. The retroperitoneum was entered in the left lower quadrant and the posterior rectus sheath was opened laterally. The peritoneum was not entered. Dissection was treated with pushing the intra-abdominal contents to the right. The left ureter was identified and preserved. The iliac vessels were mobilized to give exposure of the L5-S1 disc. The middle sacral vessels were clipped and divided for mobilization. Once adequate mobilization of the arteries and veins was accomplished the Brau retractor was brought into the field. The reverse lip 200 blades were positioned to the right and left of the L5-S1 disc. Malleable for laser were placed for superior and inferior exposure. C-arm again was used to confirm that this was indeed the L5-S1 disc. The remainder of the procedure dictated as a separate note by  Dr. Mikal Plane.   Gretta Began, M.D. 09/20/2011 3:19 PM

## 2011-09-20 NOTE — Anesthesia Procedure Notes (Signed)
Procedure Name: Intubation Date/Time: 09/20/2011 8:13 AM Performed by: Malachi Pro Pre-anesthesia Checklist: Patient identified, Emergency Drugs available, Suction available, Patient being monitored and Timeout performed Patient Re-evaluated:Patient Re-evaluated prior to inductionOxygen Delivery Method: Circle System Utilized Preoxygenation: Pre-oxygenation with 100% oxygen Intubation Type: Combination inhalational/ intravenous induction Ventilation: Mask ventilation without difficulty Laryngoscope Size: Mac and 3 Grade View: Grade II Tube type: Oral Tube size: 7.5 mm Number of attempts: 1 Airway Equipment and Method: stylet Placement Confirmation: ETT inserted through vocal cords under direct vision,  positive ETCO2 and breath sounds checked- equal and bilateral Secured at: 23 cm Tube secured with: Tape Dental Injury: Teeth and Oropharynx as per pre-operative assessment

## 2011-09-21 LAB — CBC
HCT: 32.3 % — ABNORMAL LOW (ref 36.0–46.0)
Hemoglobin: 10.1 g/dL — ABNORMAL LOW (ref 12.0–15.0)
MCH: 25.4 pg — ABNORMAL LOW (ref 26.0–34.0)
MCHC: 31.3 g/dL (ref 30.0–36.0)
MCV: 81.2 fL (ref 78.0–100.0)
Platelets: 315 10*3/uL (ref 150–400)
RBC: 3.98 MIL/uL (ref 3.87–5.11)
RDW: 15.6 % — ABNORMAL HIGH (ref 11.5–15.5)
WBC: 13.5 10*3/uL — ABNORMAL HIGH (ref 4.0–10.5)

## 2011-09-21 LAB — BASIC METABOLIC PANEL
BUN: 4 mg/dL — ABNORMAL LOW (ref 6–23)
CO2: 23 mEq/L (ref 19–32)
Calcium: 8.7 mg/dL (ref 8.4–10.5)
Chloride: 104 mEq/L (ref 96–112)
Creatinine, Ser: 0.73 mg/dL (ref 0.50–1.10)
GFR calc Af Amer: >90 mL/min (ref >90)
GFR calc non Af Amer: >90 mL/min (ref >90)
Glucose, Bld: 124 mg/dL — ABNORMAL HIGH (ref 70–99)
Potassium: 3.3 mEq/L — ABNORMAL LOW (ref 3.5–5.1)
Sodium: 140 mEq/L (ref 135–145)

## 2011-09-21 MED ORDER — ENOXAPARIN SODIUM 40 MG/0.4ML ~~LOC~~ SOLN
40.0000 mg | SUBCUTANEOUS | Status: DC
  Administered 2011-09-21 – 2011-09-26 (×6): 40 mg via SUBCUTANEOUS
  Filled 2011-09-21 (×6): qty 0.4

## 2011-09-21 MED ORDER — DIAZEPAM 5 MG PO TABS
5.0000 mg | ORAL_TABLET | Freq: Four times a day (QID) | ORAL | Status: DC | PRN
  Administered 2011-09-21 – 2011-09-23 (×6): 5 mg via ORAL
  Administered 2011-09-23: 10 mg via ORAL
  Administered 2011-09-23 (×2): 5 mg via ORAL
  Administered 2011-09-23: 10 mg via ORAL
  Administered 2011-09-24: 5 mg via ORAL
  Administered 2011-09-24: 10 mg via ORAL
  Administered 2011-09-24: 5 mg via ORAL
  Administered 2011-09-25: 10 mg via ORAL
  Administered 2011-09-25 (×2): 5 mg via ORAL
  Administered 2011-09-26: 10 mg via ORAL
  Filled 2011-09-21 (×7): qty 1
  Filled 2011-09-21 (×2): qty 2
  Filled 2011-09-21: qty 1
  Filled 2011-09-21 (×3): qty 2
  Filled 2011-09-21 (×4): qty 1

## 2011-09-21 NOTE — Progress Notes (Addendum)
K+ level 3.3, Hgb 10.1 late am, called results to Dr Venetia Maxon, no orders received at this time. Commented to encourage foods with potassium.  Pt informed.

## 2011-09-21 NOTE — Progress Notes (Signed)
On call MD notified of need for home meds to be ordered, pt on a few of her home meds here but not all, pt voices concern. (Dr Venetia Maxon on call at time of request).

## 2011-09-21 NOTE — Progress Notes (Signed)
Physical Therapy Evaluation Patient Details Name: [name] MRN: [id number] DOB: Jun 05, 1961 Today's Date: 09/21/2011  Problem List: There is no problem list on file for this patient.   Past Medical History:  Past Medical History  Diagnosis Date  . Bronchitis   . Jones fracture     left foot fifth metatarsal  . Fibromyalgia   . Varicose vein     protrudes above skin-per pt;vein popped and bruised;ultrasound done to make sure that there were no clots;noclots were found  . Chronic back pain   . Multiple allergies   . Asthma     but pt states not severe enough to even have an inhaler  . Shortness of breath     with exertion;pt states that its related to her weight  . Hyperthyroidism     but pt doesn't take anything for it;thyroid nodules  . Headache   . Dizziness     rarely  . Impaired memory   . Joint pain   . Joint swelling   . GERD (gastroesophageal reflux disease)     doesn't take anything for it  . IBS (irritable bowel syndrome)   . Arthritis     bilateral knees  . Bruising     pt states unexplained d/t fibromyalgia  . Urinary frequency   . Increased thirst   . Anemia   . Anxiety     takes Ativan and Valium  . Depression     from Fibromyalgia diagnosis  . Insomnia     takes Ambien  . Sore gums     this is why pt is on Amoxil-only takes for dental work  . Hypertension     recently diagnosed (this month)   Past Surgical History:  Past Surgical History  Procedure Date  . Cesarean section 1984  . Spine surgery 2004    Cervical plate, ACDF  . Knee surgery 2005    Left knee arthroscopy  . Nasal septoplasty w/ turbinoplasty 2007  . Tonsillectomy 2007  . Fracture surgery 05/08/2010    Jones fracture left foot fifth metatarsal  . Abdominal hysterectomy     partial hysterectomy -mid 2000's    PT Assessment/Plan/Recommendation PT Assessment Clinical Impression Statement: Patient is a 50 yo female admitted for L5-S1 ALIF.  Patient required min assist  for mobility today, requiring increased time for each transition.  Patient to have 24 hour assist at discharge (family from out of town to assist).  Recommend HHPT at discharge. PT Recommendation/Assessment: Patient will need skilled PT in the acute care venue PT Problem List: Decreased strength;Decreased activity tolerance;Decreased mobility;Decreased knowledge of use of DME;Decreased knowledge of precautions;Obesity;Pain PT Therapy Diagnosis : Difficulty walking;Generalized weakness;Acute pain PT Plan PT Frequency: Min 5X/week PT Treatment/Interventions: DME instruction;Gait training;Stair training;Functional mobility training;Patient/family education PT Recommendation Follow Up Recommendations: Home health PT Equipment Recommended: Rolling walker with 5" wheels PT Goals  Acute Rehab PT Goals PT Goal Formulation: With patient Time For Goal Achievement: 7 days Pt will Roll Supine to Right Side: Independently PT Goal: Rolling Supine to Right Side - Progress: Not met Pt will Roll Supine to Left Side: Independently PT Goal: Rolling Supine to Left Side - Progress: Not met Pt will go Supine/Side to Sit: with supervision;with HOB 0 degrees PT Goal: Supine/Side to Sit - Progress: Not met Pt will go Sit to Supine/Side: with supervision;with HOB 0 degrees PT Goal: Sit to Supine/Side - Progress: Not met Pt will go Sit to Stand: with supervision PT Goal: Sit to Stand -  Progress: Not met Pt will go Stand to Sit: with supervision PT Goal: Stand to Sit - Progress: Not met Pt will Ambulate: 51 - 150 feet;with supervision;with least restrictive assistive device PT Goal: Ambulate - Progress: Not met Pt will Go Up / Down Stairs: 3-5 stairs;with min assist;with least restrictive assistive device PT Goal: Up/Down Stairs - Progress: Not met Additional Goals Additional Goal #1: Patient will recall 3/3 back precautions PT Goal: Additional Goal #1 - Progress: Not met  PT  Evaluation Precautions/Restrictions  Precautions Precautions: Back Precaution Booklet Issued: Yes (comment) Required Braces or Orthoses: No (No orders for brace/no brace in room) Restrictions Weight Bearing Restrictions: No Prior Functioning  Home Living Lives With: Alone Receives Help From: Family (Aunt from [place] and sister from [place] will be helping) Type of Home: House Home Layout: One level Home Access: Stairs to enter Entrance Stairs-Rails: Doctor, general practice of Steps: 5 Bathroom Shower/Tub: Engineer, manufacturing systems: Standard Bathroom Accessibility: Yes How Accessible: Accessible via walker Home Adaptive Equipment: Tub transfer bench;Crutches;Straight cane Prior Function Level of Independence: Independent with basic ADLs;Independent with homemaking with ambulation;Independent with gait (Increased time needed for ADL's and homemaking) Driving: Yes Vocation: On disability Cognition Cognition Arousal/Alertness: Awake/alert Overall Cognitive Status: Appears within functional limits for tasks assessed Orientation Level: Oriented X4 Sensation/Coordination Sensation Light Touch: Appears Intact Extremity Assessment RUE Assessment RUE Assessment: Within Functional Limits LUE Assessment LUE Assessment: Within Functional Limits RLE Assessment RLE Assessment: Within Functional Limits (Strength grossly 4/5) LLE Assessment LLE Assessment: Within Functional Limits (Strength grossly 4-/5) Mobility (including Balance) Bed Mobility Bed Mobility: Yes Rolling Left: With rail;4: Min assist Rolling Left Details (indicate cue type and reason): Cues for technique and to avoid twisting Left Sidelying to Sit: 4: Min assist;With rails;HOB flat Left Sidelying to Sit Details (indicate cue type and reason): Cues for technique Sitting - Scoot to Edge of Bed: 5: Supervision;With rail Sitting - Scoot to Edge of Bed Details (indicate cue type and reason): Supervision  for safety Transfers Transfers: Yes Sit to Stand: 4: Min assist;From elevated surface;With upper extremity assist;From bed Sit to Stand Details (indicate cue type and reason): Cues for hand placement and to scoot to the edge of the bed before standing. Stand to Sit: 5: Supervision;With upper extremity assist;With armrests;To chair/3-in-1 Stand to Sit Details: Cues for technique and hand placement Ambulation/Gait Ambulation/Gait: Yes Ambulation/Gait Assistance: 4: Min assist Ambulation/Gait Assistance Details (indicate cue type and reason): Cues for gait sequence and safe use of RW Ambulation Distance (Feet): 14 Feet Assistive device: Rolling walker Gait Pattern: Step-through pattern;Decreased stance time - left;Decreased stride length;Shuffle (Flexed at hips)    Exercise    End of Session PT - End of Session Equipment Utilized During Treatment: Gait belt Activity Tolerance: Patient limited by fatigue;Patient limited by pain Patient left: in chair;with call bell in reach Nurse Communication: Mobility status for transfers General Behavior During Session: [facility] for tasks performed Cognition: [facility] for tasks performed  Vena Austria [phone] 09/21/2011, 11:48 AM

## 2011-09-21 NOTE — Progress Notes (Signed)
Postoperative day 1. Patient moderately tachycardic. Blood pressure somewhat low. Patient without any diaphoresis or symptoms from her relative hypotension.  She complains of pain both incisional leg and also in her back. She also complains of pain burning into her legs which she states is chronic and unchanged from her preoperative state. She does report flatus. Pain control is adequate.  On exam she is awake and alert she is oriented and appropriate. She honestly appears uncomfortable but does not appear to be in distress. Her abdomen is soft. Bowel sounds are hypoactive but present. Motor examination is limited by very poor effort involving her left lower chamois which I think is more incisional he related. Individual motor group testing on the left side appears intact. Sensory exam reveals chronic sensory loss bilaterally in both distal lower extremities which is under change from preop by report. Dressing is dry.  Slowly progressing following anterior lumbar fusion surgery. Plan to remove catheter and mobilize slowly today. Continue efforts at pain control.

## 2011-09-21 NOTE — Progress Notes (Signed)
[facility]  Daily Progress Note  Assessment/Planning: POD #1 s/p ALIF L5 S1   D/C foley  Dsg can come off tomorrow  Will check on pt tomorrow  Subjective  - 1 Day Post-Op  C/o inc pain and also pain lower leg c/w fibromyalgia/DJD knee per pt, tol breakfast.  Objective Filed Vitals:   09/20/11 2200 09/21/11 0200 09/21/11 0600 09/21/11 1008  BP: 132/83 103/54 95/56 131/70  Pulse: 98 63 110 114  Temp: 97.8 F (36.6 C) 99.6 F (37.6 C) 98.5 F (36.9 C) 98.3 F (36.8 C)  TempSrc: Oral Oral Oral Oral  Resp: 19 20 20 20   SpO2: 98% 99% 99% 99%    Intake/Output Summary (Last 24 hours) at 09/21/11 1034 Last data filed at 09/21/11 0529  Gross per 24 hour  Intake 2906.67 ml  Output   1900 ml  Net 1006.67 ml    PULM  CTAB CV  RRR GI  soft, approp. TTP, -G/R, inc bandaged, +BS  Laboratory CBC    Component Value Date/Time   WBC 10.8* 09/16/2011 1202   HGB 12.1 09/16/2011 1202   HCT 37.8 09/16/2011 1202   PLT 327 09/16/2011 1202    BMET    Component Value Date/Time   NA 141 09/16/2011 1202   K 4.9 09/16/2011 1202   CL 107 09/16/2011 1202   CO2 26 09/16/2011 1202   GLUCOSE 88 09/16/2011 1202   BUN 11 09/16/2011 1202   CREATININE 0.64 09/16/2011 1202   CALCIUM 9.9 09/16/2011 1202   GFRNONAA >90 09/16/2011 1202   GFRAA >90 09/16/2011 1202    Leonides Sake, MD [facility] Office: [phone] Pager: [phone]  09/21/2011, 10:34 AM

## 2011-09-22 MED ORDER — BISACODYL 10 MG RE SUPP
10.0000 mg | Freq: Every day | RECTAL | Status: DC | PRN
  Filled 2011-09-22: qty 1

## 2011-09-22 MED ORDER — DOCUSATE SODIUM 100 MG PO CAPS
100.0000 mg | ORAL_CAPSULE | Freq: Two times a day (BID) | ORAL | Status: DC
  Administered 2011-09-22 – 2011-09-26 (×9): 100 mg via ORAL
  Filled 2011-09-22 (×6): qty 1

## 2011-09-22 NOTE — Progress Notes (Addendum)
[facility]  Daily Progress Note  Assessment/Planning: POD #2 s/p ALIF L5/S1   Tolerating diet  Add stool softener  Staples out in 10-14 days  Available as needed  Subjective  - 2 Days Post-Op  C/o lower leg pain, inc. Pain better  Objective Filed Vitals:   09/21/11 1900 09/21/11 2200 09/22/11 0212 09/22/11 0555  BP: 142/85 137/81 148/85 112/57  Pulse: 121 90 128 93  Temp: 99 F (37.2 C) 99 F (37.2 C) 99.3 F (37.4 C) 99 F (37.2 C)  TempSrc:   Oral Oral  Resp: 20 18 22 20   SpO2:  98% 98% 92%    Intake/Output Summary (Last 24 hours) at 09/22/11 0926 Last data filed at 09/22/11 0827  Gross per 24 hour  Intake   1110 ml  Output   3603 ml  Net  -2493 ml    PULM  CTAB CV  RRR GI  Soft, appropriate TTP, inc c/d/i, staples in place   Laboratory CBC    Component Value Date/Time   WBC 13.5* 09/21/2011 1003   HGB 10.1* 09/21/2011 1003   HCT 32.3* 09/21/2011 1003   PLT 315 09/21/2011 1003    BMET    Component Value Date/Time   NA 140 09/21/2011 1003   K 3.3* 09/21/2011 1003   CL 104 09/21/2011 1003   CO2 23 09/21/2011 1003   GLUCOSE 124* 09/21/2011 1003   BUN 4* 09/21/2011 1003   CREATININE 0.73 09/21/2011 1003   CALCIUM 8.7 09/21/2011 1003   GFRNONAA >90 09/21/2011 1003   GFRAA >90 09/21/2011 1003    Leonides Sake, MD [facility] [place] Office: [phone] Pager: [phone]  09/22/2011, 9:26 AM

## 2011-09-22 NOTE — Progress Notes (Signed)
CSW received SNF consult. PT recommendation for home with [facility] and family to assist noted. CSW signing off. Please re-consult if SNF needed.  Dellie Burns, MSW, [place] [phone]

## 2011-09-22 NOTE — Progress Notes (Signed)
Subjective: Feeling better today, but still having burning in feet.  Yet to move bowels.  Objective: Vital signs in last 24 hours: Temp:  [98.3 F (36.8 C)-99.3 F (37.4 C)] 99 F (37.2 C) (12/16 0555) Pulse Rate:  [90-128] 93  (12/16 0555) Resp:  [18-22] 20  (12/16 0555) BP: (112-148)/(57-85) 112/57 mmHg (12/16 0555) SpO2:  [92 %-100 %] 92 % (12/16 0555) Weight change:     Intake/Output from previous day: 12/15 0701 - 12/16 0700 In: 750 [P.O.:600; I.V.:150] Out: 3953 [Urine:3953] Last cbgs: CBG (last 3)  No results found for this basename: GLUCAP:3 in the last 72 hours   Physical Exam Incision C/D/I.  Full strength bilateral lower extremities.  Lab Results:  Basename 09/21/11 1003  WBC 13.5*  HGB 10.1*  HCT 32.3*  PLT 315   BMET  Basename 09/21/11 1003  NA 140  K 3.3*  CL 104  CO2 23  GLUCOSE 124*  BUN 4*  CREATININE 0.73  CALCIUM 8.7    Studies/Results: Dg Chest 2 View  09/20/2011  *RADIOLOGY REPORT*  Clinical Data: Preop evaluation for lumbar surgery.  CHEST - 2 VIEW  Comparison: None.  Findings: Two views of the chest were obtained.  The lungs are clear bilaterally.  Normal appearance of the heart and mediastinum. Status post fusion in the lower cervical spine.  No evidence for edema or pleural effusions.  IMPRESSION: No acute chest findings.  Original Report Authenticated By: Richarda Overlie, M.D.   Dg Lumbar Spine 2-3 Views  09/20/2011  *RADIOLOGY REPORT*  Clinical Data: L5-S1 disc protrusion.  LUMBAR SPINE - 2-3 VIEW  Comparison: MRI dated 12/23/2010  Findings: AP and lateral C-arm images demonstrate the patient has undergone anterior interbody fusion at L5-S1.  Hardware appears in good position with anatomic alignment of the L5 and S1 segments.  IMPRESSION: The anterior fusion performed at L5-S1.  Original Report Authenticated By: Gwynn Burly, M.D.   Dg C-arm Gt 120 Min  09/20/2011  CLINICAL DATA: Anterior lumbar interbody fusion   C-ARM GT 120 MIN   Fluoroscopy was utilized by the requesting physician.  No radiographic  interpretation.     Dg Or Local Abdomen  09/20/2011  *RADIOLOGY REPORT*  Clinical Data: Anterior lumbar fusion.  Incorrect instrument count.  OR LOCAL ABDOMEN  Comparison: None.  Findings: The patient status post anterior and at L5-S1.  No unexpected radiopaque foreign body is identified.  IMPRESSION: Negative for unexpected foreign body.  Original Report Authenticated By: Bernadene Bell. Maricela Curet, M.D.    Medications: I have reviewed the patient's current medications.  Assessment/Plan: Continue to mobilize with PT.  Dulcolax suppository.  Subcutaneous lovenox.   Dorian Heckle , MD 09/22/2011, 6:31 AM

## 2011-09-22 NOTE — Progress Notes (Signed)
Physical Therapy Treatment Patient Details Name: [name] MRN: [id number] DOB: 02-04-1961 Today's Date: 09/22/2011  PT Assessment/Plan  PT - Assessment/Plan Comments on Treatment Session: Pt  did well with PT today, was somewhat limited by pain. Pt performed stairs reaching the close supervision level and walked ~200' with her RW at the supervisional level. Pt still requires RW for ambulation. PT continues to adress gait deficits and assist pt in achieving higher, safer functional mobility.  PT Plan: Discharge plan remains appropriate PT Frequency: Min 5X/week Follow Up Recommendations: Home health PT Equipment Recommended: Rolling walker with 5" wheels (pt reporting her sister has a walker - pt may not desire one) PT Goals  Acute Rehab PT Goals PT Goal Formulation: With patient Time For Goal Achievement: 7 days Pt will Roll Supine to Right Side: Independently PT Goal: Rolling Supine to Right Side - Progress: Progressing toward goal Pt will Roll Supine to Left Side: Independently PT Goal: Rolling Supine to Left Side - Progress: Progressing toward goal Pt will go Supine/Side to Sit: with supervision;with HOB 0 degrees PT Goal: Supine/Side to Sit - Progress: Progressing toward goal Pt will go Sit to Supine/Side: with supervision;with HOB 0 degrees PT Goal: Sit to Supine/Side - Progress: Progressing toward goal Pt will go Sit to Stand: with supervision PT Goal: Sit to Stand - Progress: Met Pt will go Stand to Sit: with supervision PT Goal: Stand to Sit - Progress: Met Pt will Ambulate: 51 - 150 feet;with supervision;with least restrictive assistive device PT Goal: Ambulate - Progress: Progressing toward goal (initially required min assist) Pt will Go Up / Down Stairs: 3-5 stairs;with min assist;with least restrictive assistive device PT Goal: Up/Down Stairs - Progress: Met Additional Goals Additional Goal #1: Patient will recall 3/3 back precautions PT Goal: Additional Goal #1  - Progress: Not met (pt recalled 0/3 today)  PT Treatment Precautions/Restrictions  Precautions Precautions: Back Precaution Booklet Issued: Yes (comment) Required Braces or Orthoses: No (No orders for brace/no brace in room) Restrictions Weight Bearing Restrictions: No Mobility (including Balance) Bed Mobility Bed Mobility: Yes Rolling Left: With rail;4: Min assist Rolling Left Details (indicate cue type and reason): Tactile cues for log rolling, bending Rt.LE. Verbal cues to decrease dependence on rail.  Left Sidelying to Sit: With rails;5: Supervision;HOB elevated (comment degrees) (30) Left Sidelying to Sit Details (indicate cue type and reason): HOB encouraged 3x, pt continued to elevate HOB to assist with side to sit. Verbal cues for no twisting with sitting upright.  Sitting - Scoot to Edge of Bed: 5: Supervision Sitting - Scoot to [place] of Bed Details (indicate cue type and reason): Verbal cues for efficiency and maintaining precautions.  Transfers Transfers: Yes Sit to Stand: With upper extremity assist;From bed;5: Supervision Sit to Stand Details (indicate cue type and reason): Verbal cues for hand placement, pt required multiple attempts. Verbal cues for maintaining precautions.  Stand to Sit: 5: Supervision;With upper extremity assist;With armrests;To chair/3-in-1 Stand to Sit Details: Verbal cues for precautions and to utilize armrests to control descent.  Ambulation/Gait Ambulation/Gait: Yes Ambulation/Gait Assistance: 5: Supervision Ambulation/Gait Assistance Details (indicate cue type and reason): supervision for safety, verbal cues for increased foot clearance and gait speed. Verbal cues for improving heel strike  bilaterally and increasing hip extension to upright posture.  Ambulation Distance (Feet): 200 Feet Assistive device: Rolling walker Gait Pattern: Step-through pattern;Decreased stance time - left;Decreased stride length;Shuffle;Trunk flexed (Flexed at hips,  decrease foot clearance with swing phase. ) Stairs: Yes Stairs Assistance: 4:  Min assist;5: Supervision Stairs Assistance Details (indicate cue type and reason): Min assist progressing to close supervision. Verbal cues for sequencing and safety, also maintaining precautions.  Stair Management Technique: Two rails;Step to pattern;Forwards Number of Stairs: 4  (ascend/descend)    End of Session PT - End of Session Equipment Utilized During Treatment: Gait belt Activity Tolerance: Patient limited by pain;Patient tolerated treatment well Patient left: in chair;with call bell in reach Nurse Communication: Mobility status for ambulation General Behavior During Session: [facility] for tasks performed Cognition: [facility] for tasks performed  Sherie Don 09/22/2011, 2:26 PM  Sherie Don) Carleene Mains PT, DPT Acute Rehabilitation [phone]

## 2011-09-23 MED FILL — Sodium Chloride IV Soln 0.9%: INTRAVENOUS | Qty: 1000 | Status: AC

## 2011-09-23 MED FILL — Heparin Sodium (Porcine) Inj 1000 Unit/ML: INTRAMUSCULAR | Qty: 30 | Status: AC

## 2011-09-23 MED FILL — Sodium Chloride Irrigation Soln 0.9%: Qty: 3000 | Status: AC

## 2011-09-23 NOTE — Progress Notes (Signed)
Subjective: Patient reports lack of a bowel movement. Not passing significant gas. Complains of pain in the right hip. Also feels weak in her LLE.  Objective: Vital signs in last 24 hours: Temp:  [98 F (36.7 C)-99 F (37.2 C)] 98.5 F (36.9 C) (12/17 1835) Pulse Rate:  [96-113] 104  (12/17 1835) Resp:  [16-20] 16  (12/17 1835) BP: (120-144)/(71-87) 126/84 mmHg (12/17 1835) SpO2:  [93 %-100 %] 93 % (12/17 1835)  Intake/Output from previous day: 12/16 0701 - 12/17 0700 In: 1080 [P.O.:1080] Out: -  Intake/Output this shift:    Neurologic: Mental status: Alert, oriented, thought content appropriate Cranial nerves: II: pupils equal, round, reactive to light and accommodation, III,IV,VI: extraocular muscles extra-ocular motions intact, VII: upper facial muscle function normal bilaterally, VII: lower facial muscle function normal bilaterally, VIII: hearing normal, IX: soft palate elevation normal bilaterally, XI: sternocleidomastoid strength normal bilaterally, XII: tongue strength normal  Motor: 5-/5 left hip flexors, otherwise 5/5 lower extremities  Lab Results:  Basename 09/21/11 1003  WBC 13.5*  HGB 10.1*  HCT 32.3*  PLT 315   BMET  Basename 09/21/11 1003  NA 140  K 3.3*  CL 104  CO2 23  GLUCOSE 124*  BUN 4*  CREATININE 0.73  CALCIUM 8.7    Studies/Results: No results found.  Assessment/Plan: Will continue physical therapy. Is taking a stool softener.  LOS: 3 days     Alonzo Loving L 09/23/2011, 7:38 PM

## 2011-09-23 NOTE — Progress Notes (Signed)
Physical Therapy Treatment Patient Details Name: [name] MRN: [id number] DOB: [birth year] Today's Date: 09/23/2011  PT Assessment/Plan  PT - Assessment/Plan Comments on Treatment Session: Pt pleasant & willing to participate in PT session.  C/o pain/burning in back & both legs but moves fairly well, just slow.   PT Plan: Discharge plan remains appropriate PT Frequency: Min 5X/week Follow Up Recommendations: Home health PT Equipment Recommended: Rolling walker with 5" wheels (pt reports sister has one she can bring her.  ) PT Goals  Acute Rehab PT Goals PT Goal: Rolling Supine to Left Side - Progress: Progressing toward goal PT Goal: Supine/Side to Sit - Progress: Progressing toward goal PT Goal: Sit to Stand - Progress: Progressing toward goal PT Goal: Stand to Sit - Progress: Progressing toward goal PT Goal: Ambulate - Progress: Progressing toward goal  PT Treatment Precautions/Restrictions  Precautions Precautions: Back Precaution Booklet Issued: Yes (comment) Required Braces or Orthoses: No Restrictions Weight Bearing Restrictions: No Mobility (including Balance) Bed Mobility Rolling Left: Other (comment) (Min Guard (A)) Rolling Left Details (indicate cue type and reason): Cues to reinforce back precautions & cues for hand placement/use to increase ease of transition; HOB flat without rail Left Sidelying to Sit: HOB flat;Other (comment) (Min Guard (A)) Left Sidelying to Sit Details (indicate cue type and reason): Cues to reinforce back precautions & cues for hand placement/use to increase ease of transition; HOB flat without rail; requires increased time Transfers Sit to Stand: From bed;From chair/3-in-1;With armrests;4: Min assist;With upper extremity assist Sit to Stand Details (indicate cue type and reason): cues for safe hand placement, reinforcement of back precautions, decrease trunk flexion Stand to Sit: 5: Supervision;With upper extremity assist;With  armrests Stand to Sit Details: cues for body positioning before sitting, safe hand placement Ambulation/Gait Ambulation/Gait Assistance: 5: Supervision Ambulation/Gait Assistance Details (indicate cue type and reason): cues to increase hip/knee flexion to facilitate increased floor clearance, upright posture, encouragement to increase gait speed for more functional gait speed, & to keep a more continuous flow of gait pattern Ambulation Distance (Feet): 225 Feet Assistive device: Rolling walker Gait Pattern: Step-through pattern;Decreased step length - right;Decreased step length - left;Decreased stride length;Trunk flexed Gait velocity: slow gait velocity Stairs: No Wheelchair Mobility Wheelchair Mobility: No    Exercise    End of Session PT - End of Session Equipment Utilized During Treatment: Gait belt Activity Tolerance: Patient limited by pain (per pt) Patient left: in chair;with call bell in reach General Behavior During Session: [facility] for tasks performed Cognition: [facility] for tasks performed  Lara Mulch 09/23/2011, 2:27 PM [phone]

## 2011-09-23 NOTE — Progress Notes (Signed)
Occupational Therapy Evaluation Patient Details Name: [name] MRN: [id number] DOB: 1961-03-27 Today's Date: 09/23/2011  Problem List: There is no problem list on file for this patient.   Past Medical History:  Past Medical History  Diagnosis Date  . Bronchitis   . Jones fracture     left foot fifth metatarsal  . Fibromyalgia   . Varicose vein     protrudes above skin-per pt;vein popped and bruised;ultrasound done to make sure that there were no clots;noclots were found  . Chronic back pain   . Multiple allergies   . Asthma     but pt states not severe enough to even have an inhaler  . Shortness of breath     with exertion;pt states that its related to her weight  . Hyperthyroidism     but pt doesn't take anything for it;thyroid nodules  . Headache   . Dizziness     rarely  . Impaired memory   . Joint pain   . Joint swelling   . GERD (gastroesophageal reflux disease)     doesn't take anything for it  . IBS (irritable bowel syndrome)   . Arthritis     bilateral knees  . Bruising     pt states unexplained d/t fibromyalgia  . Urinary frequency   . Increased thirst   . Anemia   . Anxiety     takes Ativan and Valium  . Depression     from Fibromyalgia diagnosis  . Insomnia     takes Ambien  . Sore gums     this is why pt is on Amoxil-only takes for dental work  . Hypertension     recently diagnosed (this month)   Past Surgical History:  Past Surgical History  Procedure Date  . Cesarean section 1984  . Spine surgery 2004    Cervical plate, ACDF  . Knee surgery 2005    Left knee arthroscopy  . Nasal septoplasty w/ turbinoplasty 2007  . Tonsillectomy 2007  . Fracture surgery 05/08/2010    Jones fracture left foot fifth metatarsal  . Abdominal hysterectomy     partial hysterectomy -mid 2000's    OT Assessment/Plan/Recommendation OT Assessment Clinical Impression Statement: Patient admitted for L5-S1 ALIF and presents with below problem list. Will  benefit from skilled OT in the acute setting to maximize independence with ADL and ADL mobility to facilitate safe d/c home.  OT Recommendation/Assessment: Patient will need skilled OT in the acute care venue OT Problem List: Decreased activity tolerance;Impaired balance (sitting and/or standing);Decreased knowledge of use of DME or AE;Decreased knowledge of precautions;Pain Problem List Comments: Pain is currently biggest limiting factor Barriers to Discharge Comments: Patient currently does not have a solid plan for assistance upon d/c. Will need to clarify this as patient will need assist if she is to d/c soon. May need ST-SNF stay prior to d/c if assist not available. OT Therapy Diagnosis : Acute pain;Generalized weakness OT Plan OT Frequency: Min 2X/week OT Treatment/Interventions: Self-care/ADL training;DME and/or AE instruction;Therapeutic activities;Patient/family education OT Recommendation Follow Up Recommendations: Home health OT (vs ST-SNF pending assist available upon d/c) Equipment Recommended:  (will depend on whether sister can bring RW and 3n1 or not) Individuals Consulted Consulted and Agree with Results and Recommendations: Patient OT Goals Acute Rehab OT Goals OT Goal Formulation: With patient Time For Goal Achievement: 7 days ADL Goals Pt Will Perform Upper Body Bathing: Independently;Sitting in shower ADL Goal: Upper Body Bathing - Progress: Not met Pt Will Perform  Lower Body Bathing: with modified independence;Sit to stand in shower (AE prn) ADL Goal: Lower Body Bathing - Progress: Not met Pt Will Perform Upper Body Dressing: Independently;Sitting, bed ADL Goal: Upper Body Dressing - Progress: Not met Pt Will Perform Lower Body Dressing: with modified independence;Sit to stand from bed;with adaptive equipment ADL Goal: Lower Body Dressing - Progress: Not met Pt Will Transfer to Toilet: with modified independence;3-in-1;with DME;Ambulation ADL Goal: Toilet Transfer -  Progress: Not met Pt Will Perform Toileting - Hygiene: Independently;Sit to stand from 3-in-1/toilet;with modified independence ADL Goal: Toileting - Hygiene - Progress: Not met Pt Will Perform Tub/Shower Transfer: Tub transfer;Transfer tub bench (maintaining back precautions) ADL Goal: Tub/Shower Transfer - Progress: Not met Additional ADL Goal #1: Patient will independently verbalize/generalize 3/3 back precautions ADL Goal: Additional Goal #1 - Progress: Not met  OT Evaluation Precautions/Restrictions  Precautions Precautions: Back Precaution Booklet Issued: Yes (comment) Required Braces or Orthoses: No ((No orders for brace/no brace in room)) Restrictions Weight Bearing Restrictions: No Prior Functioning Home Living Home Adaptive Equipment:  (Can borrow RW and 3n1 from sister)   ADL ADL Eating/Feeding: Performed;Independent Where Assessed - Eating/Feeding: Bed level Grooming: Performed;Wash/dry hands;Supervision/safety Grooming Details (indicate cue type and reason): patient limited in number of tasks able to perform by pain Where Assessed - Grooming: Standing at sink Upper Body Bathing: Simulated;Minimal assistance Where Assessed - Upper Body Bathing: Sitting, chair Lower Body Bathing: Simulated;Moderate assistance Where Assessed - Lower Body Bathing: Sit to stand from chair (Min guard assist for sit to stand) Upper Body Dressing: Simulated;Supervision/safety Upper Body Dressing Details (indicate cue type and reason): VC to maintain back precautions Where Assessed - Upper Body Dressing: Sitting, chair Lower Body Dressing: Simulated;Maximal assistance Lower Body Dressing Details (indicate cue type and reason): pain is greatest limited factor. VC for back precautions Where Assessed - Lower Body Dressing: Sit to stand from chair (Min guard assist for sit to stand) Toilet Transfer: Research scientist (life sciences) Details (indicate cue type and reason): VC to  maintain back precautions Toilet Transfer Method: Proofreader: Regular height toilet;Grab bars Toileting - Clothing Manipulation: Performed;Supervision/safety Where Assessed - Glass blower/designer Manipulation: Standing Toileting - Hygiene: Performed;Supervision/safety Toileting - Hygiene Details (indicate cue type and reason): VC to avoid twisting Where Assessed - Toileting Hygiene: Sit to stand from 3-in-1 or toilet Tub/Shower Transfer: Not assessed Vision/Perception  Vision - History Patient Visual Report: No change from baseline Cognition Cognition Overall Cognitive Status: Appears within functional limits for tasks assessed Orientation Level: Oriented X4 Sensation/Coordination Sensation Light Touch: Appears Intact Coordination Gross Motor Movements are Fluid and Coordinated: Yes Fine Motor Movements are Fluid and Coordinated: Yes Extremity Assessment RUE Assessment RUE Assessment: Within Functional Limits LUE Assessment LUE Assessment: Within Functional Limits Mobility  Transfers Sit to Stand: From chair/3-in-1;With armrests (Min guard (A)- secondary to increased pain) Sit to Stand Details (indicate cue type and reason): - Stand to Sit: To chair/3-in-1;With armrests (Min guard (A)) Stand to Sit Details: VC to square up with chair and to utilize armrests End of Session OT - End of Session Equipment Utilized During Treatment: Gait belt Activity Tolerance: Patient limited by pain Patient left: in chair;with call bell in reach Nurse Communication: Mobility status for transfers;Mobility status for ambulation   Garison Genova 09/23/2011, 10:05 AM

## 2011-09-24 NOTE — Progress Notes (Signed)
Physical Therapy Treatment Patient Details Name: [name] MRN: [id number] DOB: 1961-10-07 Today's Date: 09/24/2011  PT Assessment/Plan  PT - Assessment/Plan:  Pt has met sit<>stand, ambulation, stair goals but will ensure consistency with performance before revising goals/or D/C'ing from PT services if appropriate.   PT Plan: Discharge plan remains appropriate PT Frequency: Min 5X/week Follow Up Recommendations: Home health PT Equipment Recommended: Rolling walker with 5" wheels;Other (comment) (pt is checking to see if she can [facility]  RW) PT Goals  Acute Rehab PT Goals PT Goal: Rolling Supine to Left Side - Progress: Progressing toward goal PT Goal: Supine/Side to Sit - Progress: Met PT Goal: Sit to Supine/Side - Progress: Met PT Goal: Sit to Stand - Progress: Met PT Goal: Stand to Sit - Progress: Met PT Goal: Ambulate - Progress: Met PT Goal: Up/Down Stairs - Progress: Met Additional Goals PT Goal: Additional Goal #1 - Progress: Progressing toward goal (verbalized 3/3 but needed min cueing to adhere to precaution)  PT Treatment Precautions/Restrictions  Precautions Precautions: Back Precaution Booklet Issued: Yes (comment) Precaution Comments: pt independently verbalized 3/3 back precautions but required min cueing with bed mobility & transfers to reinforce precautions.   Required Braces or Orthoses: No Restrictions Weight Bearing Restrictions: No Mobility (including Balance) Bed Mobility Rolling Left: 5: Supervision Rolling Left Details (indicate cue type and reason): (S) for reinforcement of precautions- no twisting.  Cues for RUE placement/use to push shoulders/trunk to sitting upright.   Left Sidelying to Sit: 5: Supervision;HOB flat Left Sidelying to Sit Details (indicate cue type and reason): Cues for sequencing & technique.   Sit to Supine - Left: 5: Supervision;HOB flat Sit to Supine - Left Details (indicate cue type and reason): cues to reinforce  back precautions & technique.   Transfers Sit to Stand: 5: Supervision;From bed;With upper extremity assist Sit to Stand Details (indicate cue type and reason): cues for hand placement & decrease trunk flexion.   Stand to Sit: 5: Supervision Stand to Sit Details: Cues for hand placement & no twisting when locating armrests of chair  Ambulation/Gait Ambulation/Gait Assistance: 6: Modified independent (Device/Increase time) Ambulation/Gait Assistance Details (indicate cue type and reason): No cues for technique needed today, but did encourage pt to increase gait speed if tolerable- improved slightly from previous PT session (09/23/11).   Ambulation Distance (Feet): 150 Feet Assistive device: Rolling walker Gait Pattern: Step-through pattern Gait velocity: slow gait speed but better than previous PT session.   Stairs: Yes Stairs Assistance: 5: Supervision Stairs Assistance Details (indicate cue type and reason): Cues for sequencing & technique.   Stair Management Technique: Step to pattern;Two rails;Forwards Number of Stairs: 4  Wheelchair Mobility Wheelchair Mobility: No    Exercise    End of Session PT - End of Session Equipment Utilized During Treatment: Gait belt Activity Tolerance: Patient tolerated treatment well Patient left: in chair General Behavior During Session: [facility] for tasks performed Cognition: [facility] for tasks performed  Lara Mulch 09/24/2011, 10:35 AM [phone]

## 2011-09-24 NOTE — Progress Notes (Signed)
Subjective: Patient reports feels better than yesterday. Possible d/c tomorrow  Objective: Vital signs in last 24 hours: Temp:  [97.5 F (36.4 C)-98.8 F (37.1 C)] 97.7 F (36.5 C) (12/18 1840) Pulse Rate:  [96-107] 105  (12/18 1840) Resp:  [18-20] 18  (12/18 1840) BP: (129-146)/(68-87) 140/81 mmHg (12/18 1840) SpO2:  [92 %-98 %] 97 % (12/18 1840)  Intake/Output from previous day:   Intake/Output this shift:    Moving lower extremities well. walking well. wounds clean and dry.  Lab Results: No results found for this basename: WBC:2,HGB:2,HCT:2,PLT:2 in the last 72 hours BMET No results found for this basename: NA:2,K:2,CL:2,CO2:2,GLUCOSE:2,BUN:2,CREATININE:2,CALCIUM:2 in the last 72 hours  Studies/Results: No results found.  Assessment/Plan: Possible d/c tomorrow.   LOS: 4 days     Admiral Marcucci L 09/24/2011, 7:17 PM

## 2011-09-24 NOTE — Progress Notes (Signed)
Occupational Therapy Treatment Patient Details Name: [name] MRN: [id number] DOB: 12/01/1960 Today's Date: 09/24/2011  OT Assessment/Plan OT Assessment/Plan Comments on Treatment Session: Improving as pain is decreasing. OT Plan: Discharge plan remains appropriate Follow Up Recommendations: Home health OT Equipment Recommended: Rolling walker with 5" wheels;3 in 1 bedside comode OT Goals ADL Goals Pt Will Perform Upper Body Bathing: Independently ADL Goal: Upper Body Bathing - Progress: Progressing toward goals ADL Goal: Lower Body Bathing - Progress: Progressing toward goals ADL Goal: Toilet Transfer - Progress: Progressing toward goals ADL Goal: Toileting - Hygiene - Progress: Progressing toward goals ADL Goal: Tub/Shower Transfer - Progress: Progressing toward goals ADL Goal: Additional Goal #1 - Progress: Progressing toward goals  OT Treatment Precautions/Restrictions  Precautions Precautions: Back Precaution Comments: pt independently verbalized 3/3 back precautions but required min cueing with bed mobility & transfers to reinforce precautions.   Required Braces or Orthoses: No Restrictions Weight Bearing Restrictions: No   ADL ADL Grooming: Performed;Supervision/safety;Wash/dry hands Grooming Details (indicate cue type and reason): VC to maintain back precautions (specifically bending and twisting) Where Assessed - Grooming: Standing at sink Upper Body Bathing: Simulated;Supervision/safety Where Assessed - Upper Body Bathing: Shower Lower Body Bathing: Minimal assistance;Simulated Where Assessed - Lower Body Bathing: Market researcher Transfer: Research scientist (life sciences) Details (indicate cue type and reason): VC to maintain back precautions Toilet Transfer Method: Proofreader: Regular height toilet;Grab bars Toileting - Clothing Manipulation: Performed;Independent Where Assessed - Toileting Clothing Manipulation:  Standing Toileting - Hygiene: Performed;Supervision/safety Toileting - Hygiene Details (indicate cue type and reason): VC to avoid twisting Where Assessed - Toileting Hygiene: Sit to stand from 3-in-1 or toilet Tub/Shower Transfer: Performed;Minimal assistance Tub/Shower Transfer Details (indicate cue type and reason): Educated on sideways entry/exit into/out of tub/shower. Problem solved with patient best way to perform this in her home environment- asked patient to have assistance for first shower at home.  Tub/Shower Transfer Method: Ambulating ADL Comments: Next session- AE education/practice Mobility  Bed Mobility Rolling Left: 5: Supervision Rolling Left Details (indicate cue type and reason): (S) for reinforcement of precautions- no twisting. Cues for RUE placement/use to push shoulders/trunk to sitting upright.  Left Sidelying to Sit: 5: Supervision;HOB flat;With rails Left Sidelying to Sit Details (indicate cue type and reason): Cues for sequencing & technique.   Sit to Supine - Left: 5: Supervision;HOB flat Sit to Supine - Left Details (indicate cue type and reason): cues to reinforce back precautions & technique.   Transfers Sit to Stand: 5: Supervision;From bed;From chair/3-in-1;With upper extremity assist Sit to Stand Details (indicate cue type and reason): cues for hand placement & decrease trunk flexion.   Stand to Sit: 5: Supervision Stand to Sit Details: Cues for hand placement & no twisting when locating armrests of chair   End of Session OT - End of Session Equipment Utilized During Treatment: Gait belt Activity Tolerance: Patient limited by fatigue Patient left: in chair;with call bell in reach General Behavior During Session: [facility] for tasks performed Cognition: [facility] for tasks performed  [name]  09/24/2011, 12:19 PM

## 2011-09-25 DIAGNOSIS — M5136 Other intervertebral disc degeneration, lumbar region: Secondary | ICD-10-CM | POA: Diagnosis present

## 2011-09-25 DIAGNOSIS — M51369 Other intervertebral disc degeneration, lumbar region without mention of lumbar back pain or lower extremity pain: Secondary | ICD-10-CM | POA: Diagnosis present

## 2011-09-25 NOTE — Progress Notes (Signed)
Physical Therapy Treatment Patient Details Name: CECILIA VANCLEVE MRN: [id number] DOB: 03-Sep-1961 Today's Date: 09/25/2011  PT Assessment/Plan  PT - Assessment/Plan PT Plan: Discharge plan remains appropriate PT Frequency: Min 5X/week Follow Up Recommendations: Home health PT Equipment Recommended: Rolling walker with 5" wheels;3 in 1 bedside comode PT Goals  Acute Rehab PT Goals PT Goal: Rolling Supine to Right Side - Progress: Progressing toward goal PT Goal: Rolling Supine to Left Side - Progress: Progressing toward goal PT Goal: Supine/Side to Sit - Progress: Progressing toward goal PT Goal: Sit to Stand - Progress: Met PT Goal: Stand to Sit - Progress: Met PT Goal: Ambulate - Progress: Met  PT Treatment Precautions/Restrictions  Precautions Precautions: Back Precaution Booklet Issued: Yes (comment) Precaution Comments: pt independently verbalized 3/3 back precautions but required min cueing with bed mobility & transfers to reinforce precautions.   Required Braces or Orthoses: No Restrictions Weight Bearing Restrictions: No Mobility (including Balance) Bed Mobility Rolling Left: 6: Modified independent (Device/Increase time) Left Sidelying to Sit: 6: Modified independent (Device/Increase time);HOB flat Sitting - Scoot to Edge of Bed: 6: Modified independent (Device/Increase time) Transfers Sit to Stand: 5: Supervision Sit to Stand Details (indicate cue type and reason): cues for hand placement, no twisting with hip/knee extension, & decrease trunk flexion Stand to Sit: 5: Supervision Stand to Sit Details: cues to keep RW in front of her- pushed RW to side, cues for hand placement Ambulation/Gait Ambulation/Gait Assistance: 6: Modified independent (Device/Increase time) Ambulation/Gait Assistance Details (indicate cue type and reason): encouragement to increase gait speed for more functional speed.   Ambulation Distance (Feet): 300 Feet (more than 300') Assistive device:  Rolling walker Gait Pattern: Step-through pattern Gait velocity: slow but continues to improve.   Stairs: No Naval architect Mobility: No    Exercise    End of Session PT - End of Session Equipment Utilized During Treatment: Gait belt Activity Tolerance: Patient tolerated treatment well Patient left: in chair;with call bell in reach General Behavior During Session: [facility] for tasks performed Cognition: [facility] for tasks performed  Lara Mulch 09/25/2011, 12:11 PM [phone]

## 2011-09-25 NOTE — Progress Notes (Signed)
Patient ID: [name], female   DOB: August 16, 1961, 50 y.o.   MRN: [id number] BP 124/66  Pulse 97  Temp(Src) 98.4 F (36.9 C) (Oral)  Resp 18  Ht 5\' 2"  (1.575 m)  Wt 113.5 kg (250 lb 3.6 oz)  BMI 45.77 kg/m2  SpO2 100% Alert and oriented. Speech clear and fluent Moving all extremities well.  Wounds clean dry no signs infection. +Bowel movement today. Will d/c tomorrow.

## 2011-09-25 NOTE — Progress Notes (Signed)
Occupational Therapy Treatment Patient Details Name: [name] MRN: [id number] DOB: 15-May-1961 Today's Date: 09/25/2011  OT Assessment/Plan OT Assessment/Plan OT Plan:  (May need to consider ST-SNF stay pending available assist) Follow Up Recommendations: Home health OT (?SNF?) Equipment Recommended: Rolling walker with 5" wheels;3 in 1 bedside comode OT Goals ADL Goals ADL Goal: Lower Body Bathing - Progress: Progressing toward goals ADL Goal: Lower Body Dressing - Progress: Progressing toward goals ADL Goal: Toilet Transfer - Progress: Progressing toward goals ADL Goal: Toileting - Hygiene - Progress: Progressing toward goals ADL Goal: Additional Goal #1 - Progress: Progressing toward goals  OT Treatment Precautions/Restrictions  Precautions Precautions: Back Required Braces or Orthoses: No   ADL ADL Toilet Transfer: Research scientist (life sciences) Details (indicate cue type and reason): VC to maintain back precautions Toilet Transfer Method: Proofreader: Regular height toilet;Grab bars Toileting - Clothing Manipulation: Performed;Independent Where Assessed - Toileting Clothing Manipulation: Standing ADL Comments: Educated patient on use of AE for BADLs. Also, discussed at length with patient potential need for ST-SNF placement prior to returning home 2* patient continues with limitations in mobility and with ADLs and may not have any available assist upon d/c home. Will continue to d/w patient and rest of treatment team.  Mobility  Bed Mobility Rolling Left: 5: Supervision Rolling Left Details (indicate cue type and reason): Patient requiring VC for technique as she tends to want to enter and exit bed without log rolling. Left Sidelying to Sit: 5: Supervision;HOB elevated (comment degrees);With rails (~30degrees) Left Sidelying to Sit Details (indicate cue type and reason): VC to avoid twisting Transfers Sit to Stand: 5:  Supervision;From bed;From chair/3-in-1 Sit to Stand Details (indicate cue type and reason): cues for hand placement, no twisting  Stand to Sit: 5: Supervision Stand to Sit Details: VC to maintain back precautions End of Session OT - End of Session Equipment Utilized During Treatment: Gait belt Activity Tolerance: Patient limited by pain Patient left: in bed;with call bell in reach General Behavior During Session: [facility] for tasks performed Cognition: [facility] for tasks performed  Shantele Reller  09/25/2011, 2:14 PM

## 2011-09-26 ENCOUNTER — Encounter (HOSPITAL_COMMUNITY): Payer: Self-pay | Admitting: Neurosurgery

## 2011-09-26 MED ORDER — OXYCODONE-ACETAMINOPHEN 5-325 MG PO TABS: 1.0000 | ORAL_TABLET | ORAL | Status: AC | PRN

## 2011-09-26 NOTE — Discharge Summary (Signed)
Physician Discharge Summary  Patient ID: KHIYA FRIESE MRN: [id number] DOB/AGE: 49-30-1962 49 y.o.  Admit date: 09/20/2011 Discharge date: 09/26/2011  Admission Diagnoses:L5S1 degenerative disc disease, low back pain   Discharge Diagnoses:L5/S1 DDD, Low back pain Principal Problem:  *Lumbar degenerative disc disease   Discharged Condition: good  Hospital Course: Mrs. Altemose was admitted and taken to the or for a ALIF at L5/S1. Post op she had pain in the left lower extremity which has improved. She is voiding and ambulating well.   Consults: none  Significant Diagnostic Studies: none  Treatments: surgery: Anterior lumbar Interbody fusion, Synfix, infuse  Discharge Exam: Blood pressure 163/100, pulse 103, temperature 98.2 F (36.8 C), temperature source Oral, resp. rate 18, height 5\' 2"  (1.575 m), weight 113.5 kg (250 lb 3.6 oz), SpO2 100.00%. General appearance: alert Neurologic: Motor: grossly normal Coordination: normal Gait: Antalgic Incision/Wound:Clean dry no signs infection    Disposition: home   Medication List  As of 09/26/2011 11:48 AM   ASK your doctor about these medications         amoxicillin 500 MG capsule   Commonly known as: AMOXIL      ATIVAN 1 MG tablet   Generic drug: LORazepam      BENADRYL PO      celecoxib 200 MG capsule   Commonly known as: CELEBREX      cyclobenzaprine 10 MG tablet   Commonly known as: FLEXERIL      diazepam 10 MG tablet   Commonly known as: VALIUM      gabapentin 300 MG capsule   Commonly known as: NEURONTIN      hydrochlorothiazide 25 MG tablet   Commonly known as: HYDRODIURIL      HYDROcodone-acetaminophen 7.5-500 MG per tablet   Commonly known as: LORTAB      OVER THE COUNTER MEDICATION      zolpidem 10 MG tablet   Commonly known as: AMBIEN             Signed: Javanni Maring L 09/26/2011, 11:48 AM

## 2011-09-26 NOTE — Progress Notes (Signed)
Physical Therapy Treatment Patient Details Name: [name] MRN: [id number] DOB: 12/10/1960 Today's Date: 09/26/2011  PT Assessment/Plan  PT - Assessment/Plan Comments on Treatment Session: spoke with pt re: level of (A) that she will have at home.  Pt states her aunt will be coming into town to stay day/night with her as long as needed.   PT Plan: Discharge plan remains appropriate PT Frequency: Min 5X/week Follow Up Recommendations: Home health PT Equipment Recommended: Rolling walker with 5" wheels;3 in 1 bedside comode PT Goals  Acute Rehab PT Goals PT Goal: Rolling Supine to Left Side - Progress: Not met PT Goal: Supine/Side to Sit - Progress: Met PT Goal: Sit to Supine/Side - Progress: Met PT Goal: Sit to Stand - Progress: Met PT Goal: Stand to Sit - Progress: Met PT Goal: Ambulate - Progress: Met Additional Goals PT Goal: Additional Goal #1 - Progress: Not met (verbalizes precautions but needs reinforcement with activity)  PT Treatment Precautions/Restrictions  Precautions Precautions: Back Precaution Booklet Issued: Yes (comment) Precaution Comments: Independently verbalized 3/3 back precautions but requires cueing to reinforce precautions with activity- especially no twisting.   Required Braces or Orthoses: No Restrictions Weight Bearing Restrictions: No Mobility (including Balance) Bed Mobility Rolling Left: 5: Supervision Rolling Left Details (indicate cue type and reason): cues to reinforce no twisting; performed 3x's Left Sidelying to Sit: 5: Supervision Left Sidelying to Sit Details (indicate cue type and reason): cues to reinforce no twisting; performed 3x's Sit to Supine - Left: 5: Supervision Sit to Supine - Left Details (indicate cue type and reason): cues to reinforce no twisting; performed 3x's Transfers Sit to Stand: 5: Supervision;From bed;With upper extremity assist Sit to Stand Details (indicate cue type and reason): cues for no twisting &  decrease trunk flexion Stand to Sit: 5: Supervision Stand to Sit Details: cues for safety,  to maintain back precautions - no twisting when ensuring safe positioning before sitting; also, pt tends to set RW to side & take multiple steps & use furniture for UE support until she reaches [facility]- Encouraged pt to keep RW with her at all times Ambulation/Gait Ambulation/Gait Assistance: 6: Modified independent (Device/Increase time) Ambulation Distance (Feet): 75 Feet Assistive device: Rolling walker Gait Pattern: Step-through pattern Stairs: No Wheelchair Mobility Wheelchair Mobility: No    Exercise    End of Session PT - End of Session Equipment Utilized During Treatment: Gait belt Activity Tolerance: Patient tolerated treatment well Patient left: in bed;with call bell in reach General Behavior During Session: [facility] for tasks performed Cognition: [facility] for tasks performed  Lara Mulch 09/26/2011, 11:37 AM [phone]

## 2011-09-26 NOTE — Progress Notes (Signed)
Utilization review completed. Vonda Harth, RN, BSN. 09/26/11  

## 2011-09-26 NOTE — Progress Notes (Signed)
Pt discharge instructions provided along with Rx. Pt verbalized understanding. Pt under no s/s distress.

## 2011-09-26 NOTE — Progress Notes (Signed)
Discharge planning. Spoke with patient, she states she asked Dr. Mikal Plane if she needed the DME or [facility] and was told she only needs her brace. No HH ordered by CM, pt doesnt want.

## 2012-01-10 ENCOUNTER — Encounter (HOSPITAL_COMMUNITY): Payer: Self-pay | Admitting: Pharmacy Technician

## 2012-01-14 ENCOUNTER — Encounter: Payer: Self-pay | Admitting: Physician Assistant

## 2012-01-14 ENCOUNTER — Other Ambulatory Visit: Payer: Self-pay | Admitting: Physician Assistant

## 2012-01-14 NOTE — H&P (Signed)
[name] is an 50 y.o. female.   Chief Complaint: left knee DJD HPI: [name] is a 50 year-old female who is currently scheduled for bilateral total knee replacements.  Her left one is scheduled for January 27, 2012 and her right one is scheduled for March 16, 2012.  She continues to be significantly debilitated by her bilateral knee pain.  The excruciating pain is getting progressively worse, limiting activities of daily living, poses a significant fall risk, and has failed multiple conservative treatments including intraarticular cortisone injections, intraarticular Supartz, bracing, medication and home physical therapy.  Past Medical History  Diagnosis Date  . Bronchitis   . Jones fracture     left foot fifth metatarsal  . Fibromyalgia   . Varicose vein     protrudes above skin-per pt;vein popped and bruised;ultrasound done to make sure that there were no clots;noclots were found  . Chronic back pain   . Multiple allergies   . Asthma     but pt states not severe enough to even have an inhaler  . Shortness of breath     with exertion;pt states that its related to her weight  . Hyperthyroidism     but pt doesn't take anything for it;thyroid nodules  . Headache   . Dizziness     rarely  . Impaired memory   . Joint pain   . Joint swelling   . GERD (gastroesophageal reflux disease)     doesn't take anything for it  . IBS (irritable bowel syndrome)   . Arthritis     bilateral knees  . Bruising     pt states unexplained d/t fibromyalgia  . Urinary frequency   . Increased thirst   . Anemia   . Anxiety     takes Ativan and Valium  . Depression     from Fibromyalgia diagnosis  . Insomnia     takes Ambien  . Sore gums     this is why pt is on Amoxil-only takes for dental work  . Hypertension     recently diagnosed (this month)    Past Surgical History  Procedure Date  . Cesarean section 1984  . Spine surgery 2004    Cervical plate, ACDF  . Knee surgery 2005    Left  knee arthroscopy  . Nasal septoplasty w/ turbinoplasty 2007  . Tonsillectomy 2007  . Fracture surgery 05/08/2010    Jones fracture left foot fifth metatarsal  . Abdominal hysterectomy     partial hysterectomy -mid 2000's  . Anterior lumbar fusion 09/20/2011    Procedure: ANTERIOR LUMBAR FUSION 1 LEVEL;  Surgeon: Kyle L Cabbell;  Location: [facility];  Service: Neurosurgery;  Laterality: N/A;  Lumbar five-Sacral One Anterior Lumbar Interbody Fusion /Dr. Early to Approach     Family History  Problem Relation Age of Onset  . Stroke Father   . Anesthesia problems Neg Hx   . Hypotension Neg Hx   . Malignant hyperthermia Neg Hx   . Pseudochol deficiency Neg Hx   . Arthritis Sister    Social History:  reports that she quit smoking about 13 years ago. She has never used smokeless tobacco. She reports that she does not drink alcohol or use illicit drugs.  Allergies:  Allergies  Allergen Reactions  . Shellfish Allergy Anaphylaxis  . Latex Other (See Comments)    Itching/rash  . Lortab Itching  . Other Other (See Comments)    MSG-    Medications Prior to Admission    Medication Sig Dispense Refill  . calcium-vitamin D (OSCAL WITH D) 500-200 MG-UNIT per tablet Take 1 tablet by mouth daily.      . celecoxib (CELEBREX) 200 MG capsule Take 200 mg by mouth daily.      . cyclobenzaprine (FLEXERIL) 10 MG tablet Take 10 mg by mouth 3 (three) times daily.        . diazepam (VALIUM) 10 MG tablet Take 10 mg by mouth 2 (two) times daily.        . diclofenac sodium (VOLTAREN) 1 % GEL Apply 1 application topically 2 (two) times daily as needed. For right shoulder and arm and legs      . DiphenhydrAMINE HCl (BENADRYL PO) Take 2 capsules by mouth 1 day or 1 dose. In case she comes in contact with shellfish or shrimp      . gabapentin (NEURONTIN) 300 MG capsule Take 300 mg by mouth 3 (three) times daily.        . hydrochlorothiazide (HYDRODIURIL) 25 MG tablet Take 25 mg by mouth daily. One tablet by  mouth in the morning.       . HYDROcodone-acetaminophen (LORTAB) 7.5-500 MG per tablet Take 1 tablet by mouth every 6 (six) hours as needed. For pain      . LORazepam (ATIVAN) 1 MG tablet Take 1 mg by mouth as needed. For anxiety attacks      . OVER THE COUNTER MEDICATION Take 2 tablets by mouth every 6 (six) hours as needed. For allergy and sinus pressure [employer]       . zolpidem (AMBIEN) 10 MG tablet Take 10 mg by mouth at bedtime as needed. For sleep       No current facility-administered medications on file as of 01/14/2012.    No results found for this or any previous visit (from the past 48 hour(s)). No results found.  Review of Systems  Constitutional: Positive for malaise/fatigue. Negative for fever, chills, weight loss and diaphoresis.  HENT: Positive for tinnitus. Negative for hearing loss, ear pain, nosebleeds, congestion, sore throat, neck pain and ear discharge.   Eyes: Positive for blurred vision and double vision. Negative for photophobia, pain, discharge and redness.  Respiratory: Negative for cough, hemoptysis, sputum production, shortness of breath, wheezing and stridor.   Cardiovascular: Positive for leg swelling. Negative for chest pain, palpitations, orthopnea, claudication and PND.  Gastrointestinal: Positive for constipation. Negative for heartburn, nausea, vomiting, abdominal pain, diarrhea, blood in stool and melena.  Genitourinary: Negative for dysuria, urgency, frequency, hematuria and flank pain.  Musculoskeletal: Positive for back pain and joint pain. Negative for myalgias and falls.       Bilateral knee  Skin: Negative for itching and rash.  Neurological: Positive for dizziness, tingling, tremors, sensory change and headaches. Negative for speech change, focal weakness, seizures, loss of consciousness and weakness.  Endo/Heme/Allergies: Positive for environmental allergies and polydipsia. Bruises/bleeds easily.  Psychiatric/Behavioral: Positive for  depression and memory loss. Negative for suicidal ideas, hallucinations and substance abuse. The patient is nervous/anxious and has insomnia.     Blood pressure 151/104, pulse 112, temperature 97.9 F (36.6 C), height 5' 2" (1.575 m), weight 112.492 kg (248 lb), SpO2 99.00%. Physical Exam  Constitutional: She is oriented to person, place, and time. She appears well-developed and well-nourished.  HENT:  Head: Normocephalic and atraumatic.  Mouth/Throat: Oropharynx is clear and moist.  Eyes: Conjunctivae and EOM are normal. Pupils are equal, round, and reactive to light.  Neck: Neck supple.  Cardiovascular: Normal   rate, regular rhythm and intact distal pulses.   Respiratory: Effort normal.  GI: Soft. Bowel sounds are normal.  Genitourinary:       Not pertinent to current symptomatology therefore not examined.  Musculoskeletal:       .  She is independently ambulatory with a moderately antalgic gait.  No assistive devices today.  Evaluation of her neck shows cervical range of motion within normal limits.  Right shoulder has full range of motion.  Tenderness over her biceps insertion.  Positive impingement.  2+ radial pulse.  Bilateral equal strength.  Left shoulder has full range of motion without pain, weakness or deformity.  Both knees have active range of motion 0-120 degrees.  10/10 pain.  Medial joint line tenderness.  2+ crepitus.  2+ synovitis.  Normal patellar tracking.  No effusion.  She has 2+ dorsalis pedis pulses.  Currently bilateral equal sensory exam.    Neurological: She is alert and oriented to person, place, and time. She has normal reflexes.  Skin: Skin is warm and dry.  Psychiatric: She has a normal mood and affect. Her behavior is normal. Judgment and thought content normal.     Assessment Past Medical History  Diagnosis Date  . Bronchitis   . Jones fracture     left foot fifth metatarsal  . Fibromyalgia   . Varicose vein     protrudes above skin-per pt;vein popped  and bruised;ultrasound done to make sure that there were no clots;noclots were found  . Chronic back pain   . Multiple allergies   . Asthma     but pt states not severe enough to even have an inhaler  . Shortness of breath     with exertion;pt states that its related to her weight  . Hyperthyroidism     but pt doesn't take anything for it;thyroid nodules  . Headache   . Dizziness     rarely  . Impaired memory   . Joint pain   . Joint swelling   . GERD (gastroesophageal reflux disease)     doesn't take anything for it  . IBS (irritable bowel syndrome)   . Arthritis     bilateral knees  . Bruising     pt states unexplained d/t fibromyalgia  . Urinary frequency   . Increased thirst   . Anemia   . Anxiety     takes Ativan and Valium  . Depression     from Fibromyalgia diagnosis  . Insomnia     takes Ambien  . Sore gums     this is why pt is on Amoxil-only takes for dental work  . Hypertension     recently diagnosed (this month)    Plan Left total knee replacement.  The risks, benefits, and possible complications of the procedure were discussed in detail with the patient.  The patient is without question.  Nirel Babler J 01/14/2012, 3:21 PM    

## 2012-01-21 ENCOUNTER — Encounter (HOSPITAL_COMMUNITY): Payer: Self-pay

## 2012-01-21 ENCOUNTER — Encounter (HOSPITAL_COMMUNITY)
Admission: RE | Admit: 2012-01-21 | Discharge: 2012-01-21 | Disposition: A | Discharge status: Home / Self Care | Payer: Medicare Other | Source: Ambulatory Visit | Attending: Orthopedic Surgery | Admitting: Orthopedic Surgery

## 2012-01-21 LAB — CBC
HCT: 40.2 % (ref 36.0–46.0)
Hemoglobin: 13.1 g/dL (ref 12.0–15.0)
MCH: 25.3 pg — ABNORMAL LOW (ref 26.0–34.0)
MCHC: 32.6 g/dL (ref 30.0–36.0)
MCV: 77.6 fL — ABNORMAL LOW (ref 78.0–100.0)
Platelets: 387 10*3/uL (ref 150–400)
RBC: 5.18 MIL/uL — ABNORMAL HIGH (ref 3.87–5.11)
RDW: 15.3 % (ref 11.5–15.5)
WBC: 12.6 10*3/uL — ABNORMAL HIGH (ref 4.0–10.5)

## 2012-01-21 LAB — URINALYSIS, ROUTINE W REFLEX MICROSCOPIC
Bilirubin Urine: NEGATIVE
Glucose, UA: NEGATIVE mg/dL
Hgb urine dipstick: NEGATIVE
Ketones, ur: NEGATIVE mg/dL
Nitrite: NEGATIVE
Protein, ur: NEGATIVE mg/dL
Specific Gravity, Urine: 1.02 (ref 1.005–1.030)
Urobilinogen, UA: 0.2 mg/dL (ref 0.0–1.0)
pH: 6 (ref 5.0–8.0)

## 2012-01-21 LAB — COMPREHENSIVE METABOLIC PANEL
ALT: 20 U/L (ref 0–35)
AST: 19 U/L (ref 0–37)
Albumin: 4.2 g/dL (ref 3.5–5.2)
Alkaline Phosphatase: 112 U/L (ref 39–117)
BUN: 12 mg/dL (ref 6–23)
CO2: 26 mEq/L (ref 19–32)
Calcium: 10.3 mg/dL (ref 8.4–10.5)
Chloride: 98 mEq/L (ref 96–112)
Creatinine, Ser: 0.68 mg/dL (ref 0.50–1.10)
GFR calc Af Amer: >90 mL/min (ref >90)
GFR calc non Af Amer: >90 mL/min (ref >90)
Glucose, Bld: 95 mg/dL (ref 70–99)
Potassium: 3.5 mEq/L (ref 3.5–5.1)
Sodium: 140 mEq/L (ref 135–145)
Total Bilirubin: 0.1 mg/dL — ABNORMAL LOW (ref 0.3–1.2)
Total Protein: 7.4 g/dL (ref 6.0–8.3)

## 2012-01-21 LAB — URINE MICROSCOPIC-ADD ON

## 2012-01-21 LAB — APTT: aPTT: 32 seconds (ref 24–37)

## 2012-01-21 LAB — DIFFERENTIAL
Basophils Absolute: 0 10*3/uL (ref 0.0–0.1)
Basophils Relative: 0 % (ref 0–1)
Eosinophils Absolute: 0.2 10*3/uL (ref 0.0–0.7)
Eosinophils Relative: 2 % (ref 0–5)
Lymphocytes Relative: 30 % (ref 12–46)
Lymphs Abs: 3.7 10*3/uL (ref 0.7–4.0)
Monocytes Absolute: 0.8 10*3/uL (ref 0.1–1.0)
Monocytes Relative: 6 % (ref 3–12)
Neutro Abs: 7.8 10*3/uL — ABNORMAL HIGH (ref 1.7–7.7)
Neutrophils Relative %: 62 % (ref 43–77)

## 2012-01-21 LAB — PROTIME-INR
INR: 0.91 (ref 0.00–1.49)
Prothrombin Time: 12.5 seconds (ref 11.6–15.2)

## 2012-01-21 LAB — SURGICAL PCR SCREEN
MRSA, PCR: NEGATIVE
Staphylococcus aureus: NEGATIVE

## 2012-01-21 LAB — TYPE AND SCREEN
ABO/RH(D): B POS
Antibody Screen: NEGATIVE

## 2012-01-21 MED ORDER — CHLORHEXIDINE GLUCONATE 4 % EX LIQD: 60.0000 mL | Freq: Once | CUTANEOUS | Status: DC

## 2012-01-21 NOTE — Pre-Procedure Instructions (Signed)
21 [name]   01/21/2012   Your procedure is scheduled on:  Monday, April 22nd   Report to [facility] at  5:30  AM.   Call this number if you have problems the morning of surgery: [phone]   Remember:   Do not eat food:After Midnight Sunday.  May have clear liquids: up to 4 Hours before arrival time---1:30AM.  Clear liquids include soda, tea, black coffee, apple or grape juice, broth.   Take these medicines the morning of surgery with A SIP OF WATER: Lortab, Ativan OR Valium   Do not wear jewelry, make-up or nail polish.  Do not wear lotions, powders, or perfumes. You may wear deodorant.   Do not shave 48 hours prior to surgery.  Do not bring valuables to the hospital.   Contacts, dentures or bridgework may not be worn into surgery.  Leave suitcase in the car. After surgery it may be brought to your room.  For patients admitted to the hospital, checkout time is 11:00 AM the day of discharge.   Patients discharged the day of surgery will not be allowed to drive home.   Name and phone number of your driver: RENEE COOPER  -SISTER   Special Instructions: CHG Shower Use Special Wash: 1/2 bottle night before surgery and 1/2 bottle morning of surgery.   Please read over the following fact sheets that you were given: Pain Booklet, Blood Transfusion Information, MRSA Information and Surgical Site Infection Prevention

## 2012-01-21 NOTE — Progress Notes (Signed)
HAS HAD MULTIPLE BLACK OUTS SINCE HIGH SCHOOL......Marland KitchenALMOST BLACKED OUT 2 DAYS AGO...REALLY BLACKED OUT OVER 6 MTHS AGO....DENIES ANY INJURY

## 2012-01-22 LAB — URINE CULTURE
Colony Count: 40000
Culture  Setup Time: 201304161149

## 2012-01-26 MED ORDER — CEFAZOLIN SODIUM-DEXTROSE 2-3 GM-% IV SOLR
2.0000 g | INTRAVENOUS | Status: AC
  Administered 2012-01-27: 2 g via INTRAVENOUS
  Filled 2012-01-26: qty 50

## 2012-01-26 MED ORDER — POVIDONE-IODINE 7.5 % EX SOLN
Freq: Once | CUTANEOUS | Status: DC
  Filled 2012-01-26: qty 118

## 2012-01-27 ENCOUNTER — Encounter (HOSPITAL_COMMUNITY)
Admission: RE | Disposition: A | Discharge status: Home Health | Payer: Self-pay | Source: Ambulatory Visit | Attending: Orthopedic Surgery

## 2012-01-27 ENCOUNTER — Encounter (HOSPITAL_COMMUNITY): Payer: Self-pay | Admitting: Physician Assistant

## 2012-01-27 ENCOUNTER — Ambulatory Visit (HOSPITAL_COMMUNITY): Payer: Medicare Other | Admitting: Anesthesiology

## 2012-01-27 ENCOUNTER — Inpatient Hospital Stay (HOSPITAL_COMMUNITY)
Admission: RE | Admit: 2012-01-27 | Discharge: 2012-01-30 | DRG: 470 | Disposition: A | Discharge status: Home Health | Payer: Medicare Other | Source: Ambulatory Visit | Attending: Orthopedic Surgery | Admitting: Orthopedic Surgery

## 2012-01-27 ENCOUNTER — Encounter (HOSPITAL_COMMUNITY): Payer: Self-pay | Admitting: Anesthesiology

## 2012-01-27 ENCOUNTER — Encounter (HOSPITAL_COMMUNITY): Payer: Self-pay | Admitting: Vascular Surgery

## 2012-01-27 ENCOUNTER — Encounter (HOSPITAL_COMMUNITY): Payer: Self-pay | Admitting: *Deleted

## 2012-01-27 DIAGNOSIS — M51369 Other intervertebral disc degeneration, lumbar region without mention of lumbar back pain or lower extremity pain: Secondary | ICD-10-CM | POA: Diagnosis present

## 2012-01-27 DIAGNOSIS — M51379 Other intervertebral disc degeneration, lumbosacral region without mention of lumbar back pain or lower extremity pain: Secondary | ICD-10-CM | POA: Diagnosis present

## 2012-01-27 DIAGNOSIS — Z7901 Long term (current) use of anticoagulants: Secondary | ICD-10-CM

## 2012-01-27 DIAGNOSIS — M797 Fibromyalgia: Secondary | ICD-10-CM | POA: Diagnosis present

## 2012-01-27 DIAGNOSIS — Z8261 Family history of arthritis: Secondary | ICD-10-CM

## 2012-01-27 DIAGNOSIS — Z823 Family history of stroke: Secondary | ICD-10-CM

## 2012-01-27 DIAGNOSIS — G4700 Insomnia, unspecified: Secondary | ICD-10-CM | POA: Diagnosis present

## 2012-01-27 DIAGNOSIS — M5137 Other intervertebral disc degeneration, lumbosacral region: Secondary | ICD-10-CM | POA: Diagnosis present

## 2012-01-27 DIAGNOSIS — F32A Depression, unspecified: Secondary | ICD-10-CM | POA: Diagnosis present

## 2012-01-27 DIAGNOSIS — IMO0001 Reserved for inherently not codable concepts without codable children: Secondary | ICD-10-CM | POA: Diagnosis present

## 2012-01-27 DIAGNOSIS — M1710 Unilateral primary osteoarthritis, unspecified knee: Principal | ICD-10-CM | POA: Diagnosis present

## 2012-01-27 DIAGNOSIS — F341 Dysthymic disorder: Secondary | ICD-10-CM | POA: Diagnosis present

## 2012-01-27 DIAGNOSIS — I10 Essential (primary) hypertension: Secondary | ICD-10-CM | POA: Diagnosis present

## 2012-01-27 DIAGNOSIS — M2550 Pain in unspecified joint: Secondary | ICD-10-CM | POA: Diagnosis present

## 2012-01-27 DIAGNOSIS — F329 Major depressive disorder, single episode, unspecified: Secondary | ICD-10-CM | POA: Diagnosis present

## 2012-01-27 DIAGNOSIS — Z87891 Personal history of nicotine dependence: Secondary | ICD-10-CM

## 2012-01-27 DIAGNOSIS — K219 Gastro-esophageal reflux disease without esophagitis: Secondary | ICD-10-CM | POA: Diagnosis present

## 2012-01-27 DIAGNOSIS — M5136 Other intervertebral disc degeneration, lumbar region: Secondary | ICD-10-CM | POA: Diagnosis present

## 2012-01-27 DIAGNOSIS — M1712 Unilateral primary osteoarthritis, left knee: Secondary | ICD-10-CM | POA: Diagnosis present

## 2012-01-27 DIAGNOSIS — E876 Hypokalemia: Secondary | ICD-10-CM | POA: Diagnosis not present

## 2012-01-27 DIAGNOSIS — F419 Anxiety disorder, unspecified: Secondary | ICD-10-CM | POA: Diagnosis present

## 2012-01-27 DIAGNOSIS — K589 Irritable bowel syndrome without diarrhea: Secondary | ICD-10-CM | POA: Diagnosis present

## 2012-01-27 DIAGNOSIS — Z01812 Encounter for preprocedural laboratory examination: Secondary | ICD-10-CM

## 2012-01-27 DIAGNOSIS — D62 Acute posthemorrhagic anemia: Secondary | ICD-10-CM | POA: Diagnosis present

## 2012-01-27 DIAGNOSIS — Z79899 Other long term (current) drug therapy: Secondary | ICD-10-CM

## 2012-01-27 HISTORY — PX: JOINT REPLACEMENT: SHX530

## 2012-01-27 HISTORY — PX: STERIOD INJECTION: SHX5046

## 2012-01-27 HISTORY — PX: TOTAL KNEE ARTHROPLASTY: SHX125

## 2012-01-27 SURGERY — ARTHROPLASTY, KNEE, TOTAL
Anesthesia: General | Site: Knee | Laterality: Right | Wound class: Clean

## 2012-01-27 MED ORDER — ONDANSETRON HCL 4 MG/2ML IJ SOLN: 4.0000 mg | Freq: Once | INTRAMUSCULAR | Status: DC | PRN

## 2012-01-27 MED ORDER — SODIUM CHLORIDE 0.9 % IR SOLN
Status: DC | PRN
  Administered 2012-01-27: 3000 mL

## 2012-01-27 MED ORDER — ONDANSETRON HCL 4 MG PO TABS: 4.0000 mg | ORAL_TABLET | Freq: Four times a day (QID) | ORAL | Status: DC | PRN

## 2012-01-27 MED ORDER — MENTHOL 3 MG MT LOZG
1.0000 | LOZENGE | OROMUCOSAL | Status: DC | PRN
  Administered 2012-01-27: 3 mg via ORAL
  Filled 2012-01-27: qty 9

## 2012-01-27 MED ORDER — METOCLOPRAMIDE HCL 5 MG/ML IJ SOLN: 5.0000 mg | Freq: Three times a day (TID) | INTRAMUSCULAR | Status: DC | PRN

## 2012-01-27 MED ORDER — DIAZEPAM 5 MG/ML IJ SOLN
5.0000 mg | Freq: Once | INTRAMUSCULAR | Status: AC
  Administered 2012-01-27: 5 mg via INTRAVENOUS

## 2012-01-27 MED ORDER — CEFUROXIME SODIUM 1.5 G IJ SOLR
INTRAMUSCULAR | Status: DC | PRN
  Administered 2012-01-27: 1.5 g

## 2012-01-27 MED ORDER — LACTATED RINGERS IV SOLN
INTRAVENOUS | Status: DC
  Administered 2012-01-27 (×2): via INTRAVENOUS

## 2012-01-27 MED ORDER — HYDROMORPHONE HCL PF 1 MG/ML IJ SOLN
INTRAMUSCULAR | Status: AC
  Filled 2012-01-27: qty 1

## 2012-01-27 MED ORDER — CALCIUM CARBONATE-VITAMIN D 500-200 MG-UNIT PO TABS
1.0000 | ORAL_TABLET | Freq: Every day | ORAL | Status: DC
  Administered 2012-01-28: 1 via ORAL
  Filled 2012-01-27 (×2): qty 1

## 2012-01-27 MED ORDER — ACETAMINOPHEN 650 MG RE SUPP: 650.0000 mg | Freq: Four times a day (QID) | RECTAL | Status: DC | PRN

## 2012-01-27 MED ORDER — HYDROMORPHONE HCL PF 1 MG/ML IJ SOLN
0.2500 mg | INTRAMUSCULAR | Status: DC | PRN
  Administered 2012-01-27 (×4): 0.5 mg via INTRAVENOUS

## 2012-01-27 MED ORDER — HYDROMORPHONE HCL PF 1 MG/ML IJ SOLN
0.5000 mg | INTRAMUSCULAR | Status: DC | PRN
  Administered 2012-01-28 (×2): 1 mg via INTRAVENOUS
  Filled 2012-01-27 (×4): qty 1

## 2012-01-27 MED ORDER — CYCLOBENZAPRINE HCL 10 MG PO TABS
10.0000 mg | ORAL_TABLET | Freq: Three times a day (TID) | ORAL | Status: DC
  Administered 2012-01-27 – 2012-01-30 (×8): 10 mg via ORAL
  Filled 2012-01-27 (×11): qty 1

## 2012-01-27 MED ORDER — ONDANSETRON HCL 4 MG/2ML IJ SOLN: 4.0000 mg | Freq: Four times a day (QID) | INTRAMUSCULAR | Status: DC | PRN

## 2012-01-27 MED ORDER — METHYLPREDNISOLONE ACETATE 80 MG/ML IJ SUSP
INTRAMUSCULAR | Status: DC | PRN
  Administered 2012-01-27: 80 mg

## 2012-01-27 MED ORDER — BUPIVACAINE HCL 0.25 % IJ SOLN
INTRAMUSCULAR | Status: DC | PRN
  Administered 2012-01-27: 6 mL

## 2012-01-27 MED ORDER — DIAZEPAM 5 MG/ML IJ SOLN
INTRAMUSCULAR | Status: AC
  Filled 2012-01-27: qty 2

## 2012-01-27 MED ORDER — ZOLPIDEM TARTRATE 10 MG PO TABS
10.0000 mg | ORAL_TABLET | Freq: Every evening | ORAL | Status: DC | PRN
  Administered 2012-01-28: 10 mg via ORAL
  Filled 2012-01-27: qty 1

## 2012-01-27 MED ORDER — OXYCODONE HCL 20 MG PO TB12
20.0000 mg | ORAL_TABLET | Freq: Two times a day (BID) | ORAL | Status: DC
  Administered 2012-01-27 – 2012-01-30 (×6): 20 mg via ORAL
  Filled 2012-01-27 (×6): qty 1

## 2012-01-27 MED ORDER — BISACODYL 5 MG PO TBEC
10.0000 mg | DELAYED_RELEASE_TABLET | Freq: Every day | ORAL | Status: DC
  Administered 2012-01-27 – 2012-01-29 (×3): 10 mg via ORAL
  Filled 2012-01-27 (×4): qty 2

## 2012-01-27 MED ORDER — POTASSIUM CHLORIDE IN NACL 20-0.9 MEQ/L-% IV SOLN
INTRAVENOUS | Status: DC
  Administered 2012-01-27 – 2012-01-28 (×2): via INTRAVENOUS
  Filled 2012-01-27 (×9): qty 1000

## 2012-01-27 MED ORDER — ENOXAPARIN SODIUM 30 MG/0.3ML ~~LOC~~ SOLN
30.0000 mg | Freq: Two times a day (BID) | SUBCUTANEOUS | Status: DC
  Administered 2012-01-28 – 2012-01-30 (×5): 30 mg via SUBCUTANEOUS
  Filled 2012-01-27 (×8): qty 0.3

## 2012-01-27 MED ORDER — ONDANSETRON HCL 4 MG/2ML IJ SOLN
INTRAMUSCULAR | Status: DC | PRN
  Administered 2012-01-27: 4 mg via INTRAVENOUS

## 2012-01-27 MED ORDER — DIPHENHYDRAMINE HCL 25 MG PO TABS
25.0000 mg | ORAL_TABLET | Freq: Four times a day (QID) | ORAL | Status: DC | PRN
  Administered 2012-01-27 – 2012-01-29 (×5): 25 mg via ORAL
  Filled 2012-01-27 (×2): qty 1
  Filled 2012-01-27: qty 2
  Filled 2012-01-27: qty 1
  Filled 2012-01-27: qty 2

## 2012-01-27 MED ORDER — OXYCODONE HCL 5 MG PO TABS
5.0000 mg | ORAL_TABLET | ORAL | Status: DC | PRN
  Administered 2012-01-27 – 2012-01-29 (×3): 10 mg via ORAL
  Filled 2012-01-27 (×3): qty 2

## 2012-01-27 MED ORDER — NEOSTIGMINE METHYLSULFATE 1 MG/ML IJ SOLN
INTRAMUSCULAR | Status: DC | PRN
  Administered 2012-01-27: 3 mg via INTRAVENOUS

## 2012-01-27 MED ORDER — GABAPENTIN 300 MG PO CAPS
300.0000 mg | ORAL_CAPSULE | Freq: Three times a day (TID) | ORAL | Status: DC
  Administered 2012-01-27 – 2012-01-30 (×8): 300 mg via ORAL
  Filled 2012-01-27 (×10): qty 1

## 2012-01-27 MED ORDER — CEFAZOLIN SODIUM-DEXTROSE 2-3 GM-% IV SOLR
2.0000 g | Freq: Four times a day (QID) | INTRAVENOUS | Status: AC
  Administered 2012-01-27 – 2012-01-28 (×3): 2 g via INTRAVENOUS
  Filled 2012-01-27 (×4): qty 50

## 2012-01-27 MED ORDER — PHENOL 1.4 % MT LIQD: 1.0000 | OROMUCOSAL | Status: DC | PRN

## 2012-01-27 MED ORDER — CELECOXIB 200 MG PO CAPS
200.0000 mg | ORAL_CAPSULE | Freq: Two times a day (BID) | ORAL | Status: DC
  Administered 2012-01-27 – 2012-01-30 (×6): 200 mg via ORAL
  Filled 2012-01-27 (×8): qty 1

## 2012-01-27 MED ORDER — MIDAZOLAM HCL 5 MG/5ML IJ SOLN
INTRAMUSCULAR | Status: DC | PRN
  Administered 2012-01-27: 2 mg via INTRAVENOUS

## 2012-01-27 MED ORDER — PROPOFOL 10 MG/ML IV EMUL
INTRAVENOUS | Status: DC | PRN
  Administered 2012-01-27: 200 mg via INTRAVENOUS

## 2012-01-27 MED ORDER — FENTANYL CITRATE 0.05 MG/ML IJ SOLN
INTRAMUSCULAR | Status: DC | PRN
  Administered 2012-01-27 (×2): 100 ug via INTRAVENOUS

## 2012-01-27 MED ORDER — ROCURONIUM BROMIDE 100 MG/10ML IV SOLN
INTRAVENOUS | Status: DC | PRN
  Administered 2012-01-27: 50 mg via INTRAVENOUS

## 2012-01-27 MED ORDER — DIAZEPAM 5 MG PO TABS
5.0000 mg | ORAL_TABLET | Freq: Four times a day (QID) | ORAL | Status: DC | PRN
  Administered 2012-01-27 – 2012-01-28 (×2): 5 mg via ORAL
  Filled 2012-01-27 (×2): qty 1

## 2012-01-27 MED ORDER — OXYCODONE-ACETAMINOPHEN 5-325 MG PO TABS
1.0000 | ORAL_TABLET | ORAL | Status: DC | PRN
Start: 2012-01-27 — End: 2012-01-30
  Administered 2012-01-27 – 2012-01-28 (×4): 2 via ORAL
  Administered 2012-01-29: 1 via ORAL
  Administered 2012-01-30: 2 via ORAL
  Filled 2012-01-27 (×6): qty 2

## 2012-01-27 MED ORDER — METOCLOPRAMIDE HCL 10 MG PO TABS
5.0000 mg | ORAL_TABLET | Freq: Three times a day (TID) | ORAL | Status: DC | PRN
  Filled 2012-01-27: qty 2

## 2012-01-27 MED ORDER — DOCUSATE SODIUM 100 MG PO CAPS
100.0000 mg | ORAL_CAPSULE | Freq: Two times a day (BID) | ORAL | Status: DC
  Administered 2012-01-27 – 2012-01-30 (×6): 100 mg via ORAL
  Filled 2012-01-27 (×7): qty 1

## 2012-01-27 MED ORDER — ACETAMINOPHEN 325 MG PO TABS
650.0000 mg | ORAL_TABLET | Freq: Four times a day (QID) | ORAL | Status: DC | PRN
  Administered 2012-01-29: 650 mg via ORAL
  Filled 2012-01-27: qty 2

## 2012-01-27 MED ORDER — LORAZEPAM 1 MG PO TABS
1.0000 mg | ORAL_TABLET | ORAL | Status: DC | PRN
  Administered 2012-01-28: 1 mg via ORAL
  Filled 2012-01-27: qty 1

## 2012-01-27 MED ORDER — BUPIVACAINE-EPINEPHRINE PF 0.5-1:200000 % IJ SOLN
INTRAMUSCULAR | Status: DC | PRN
  Administered 2012-01-27: 20 mL

## 2012-01-27 MED ORDER — GLYCOPYRROLATE 0.2 MG/ML IJ SOLN
INTRAMUSCULAR | Status: DC | PRN
  Administered 2012-01-27: 0.6 mg via INTRAVENOUS

## 2012-01-27 SURGICAL SUPPLY — 68 items
BANDAGE ESMARK 6X9 LF (GAUZE/BANDAGES/DRESSINGS) ×2 IMPLANT
BLADE SAGITTAL 25.0X1.19X90 (BLADE) ×3 IMPLANT
BLADE SAW SGTL 11.0X1.19X90.0M (BLADE) IMPLANT
BLADE SAW SGTL 13.0X1.19X90.0M (BLADE) ×3 IMPLANT
BLADE SURG 10 STRL SS (BLADE) ×7 IMPLANT
BNDG CMPR 9X6 STRL LF SNTH (GAUZE/BANDAGES/DRESSINGS) ×2
BNDG CMPR MED 15X6 ELC VLCR LF (GAUZE/BANDAGES/DRESSINGS) ×2
BNDG ELASTIC 6X15 VLCR STRL LF (GAUZE/BANDAGES/DRESSINGS) ×3 IMPLANT
BNDG ESMARK 6X9 LF (GAUZE/BANDAGES/DRESSINGS) ×3
BOWL SMART MIX CTS (DISPOSABLE) ×3 IMPLANT
CEMENT HV SMART SET (Cement) ×6 IMPLANT
CLOTH BEACON ORANGE TIMEOUT ST (SAFETY) ×3 IMPLANT
COVER BACK TABLE 24X17X13 BIG (DRAPES) IMPLANT
COVER PROBE W GEL 5X96 (DRAPES) ×3 IMPLANT
COVER SURGICAL LIGHT HANDLE (MISCELLANEOUS) ×3 IMPLANT
CUFF TOURNIQUET SINGLE 34IN LL (TOURNIQUET CUFF) ×3 IMPLANT
CUFF TOURNIQUET SINGLE 44IN (TOURNIQUET CUFF) ×1 IMPLANT
DRAPE EXTREMITY T 121X128X90 (DRAPE) ×3 IMPLANT
DRAPE INCISE IOBAN 66X45 STRL (DRAPES) ×3 IMPLANT
DRAPE PROXIMA HALF (DRAPES) ×3 IMPLANT
DRAPE U-SHAPE 47X51 STRL (DRAPES) ×3 IMPLANT
DRSG ADAPTIC 3X8 NADH LF (GAUZE/BANDAGES/DRESSINGS) ×3 IMPLANT
DRSG PAD ABDOMINAL 8X10 ST (GAUZE/BANDAGES/DRESSINGS) ×6 IMPLANT
DURAPREP 26ML APPLICATOR (WOUND CARE) ×3 IMPLANT
ELECT CAUTERY BLADE 6.4 (BLADE) ×3 IMPLANT
ELECT REM PT RETURN 9FT ADLT (ELECTROSURGICAL) ×3
ELECTRODE REM PT RTRN 9FT ADLT (ELECTROSURGICAL) ×2 IMPLANT
EVACUATOR 1/8 PVC DRAIN (DRAIN) IMPLANT
FACESHIELD LNG OPTICON STERILE (SAFETY) ×3 IMPLANT
GLOVE BIO SURGEON STRL SZ7 (GLOVE) ×3 IMPLANT
GLOVE BIOGEL PI IND STRL 7.0 (GLOVE) ×2 IMPLANT
GLOVE BIOGEL PI IND STRL 7.5 (GLOVE) ×2 IMPLANT
GLOVE BIOGEL PI INDICATOR 7.0 (GLOVE)
GLOVE BIOGEL PI INDICATOR 7.5 (GLOVE) ×1
GLOVE SS BIOGEL STRL SZ 7.5 (GLOVE) ×2 IMPLANT
GLOVE SUPERSENSE BIOGEL SZ 7.5 (GLOVE)
GOWN PREVENTION PLUS XLARGE (GOWN DISPOSABLE) ×6 IMPLANT
GOWN STRL NON-REIN LRG LVL3 (GOWN DISPOSABLE) ×6 IMPLANT
HANDPIECE INTERPULSE COAX TIP (DISPOSABLE) ×3
HOOD PEEL AWAY FACE SHEILD DIS (HOOD) ×7 IMPLANT
IMMOBILIZER KNEE 22 UNIV (SOFTGOODS) ×1 IMPLANT
INSERT CUSHION PRONEVIEW LG (MISCELLANEOUS) ×3 IMPLANT
KIT BASIN OR (CUSTOM PROCEDURE TRAY) ×3 IMPLANT
KIT ROOM TURNOVER OR (KITS) ×3 IMPLANT
MANIFOLD NEPTUNE II (INSTRUMENTS) ×3 IMPLANT
NS IRRIG 1000ML POUR BTL (IV SOLUTION) ×3 IMPLANT
PACK TOTAL JOINT (CUSTOM PROCEDURE TRAY) ×3 IMPLANT
PAD ARMBOARD 7.5X6 YLW CONV (MISCELLANEOUS) ×6 IMPLANT
PAD CAST 4YDX4 CTTN HI CHSV (CAST SUPPLIES) ×2 IMPLANT
PADDING CAST COTTON 4X4 STRL (CAST SUPPLIES) ×3
PADDING CAST COTTON 6X4 STRL (CAST SUPPLIES) ×3 IMPLANT
POSITIONER HEAD PRONE TRACH (MISCELLANEOUS) ×3 IMPLANT
RUBBERBAND STERILE (MISCELLANEOUS) ×3 IMPLANT
SET HNDPC FAN SPRY TIP SCT (DISPOSABLE) ×2 IMPLANT
SPONGE GAUZE 4X4 12PLY (GAUZE/BANDAGES/DRESSINGS) ×3 IMPLANT
STRIP CLOSURE SKIN 1/2X4 (GAUZE/BANDAGES/DRESSINGS) ×3 IMPLANT
SUCTION FRAZIER TIP 10 FR DISP (SUCTIONS) ×3 IMPLANT
SUT MNCRL AB 3-0 PS2 18 (SUTURE) ×3 IMPLANT
SUT VIC AB 0 CT1 27 (SUTURE) ×6
SUT VIC AB 0 CT1 27XBRD ANBCTR (SUTURE) ×4 IMPLANT
SUT VIC AB 2-0 CT1 27 (SUTURE) ×6
SUT VIC AB 2-0 CT1 TAPERPNT 27 (SUTURE) ×4 IMPLANT
SUT VLOC 180 0 24IN GS25 (SUTURE) ×3 IMPLANT
SYR 30ML SLIP (SYRINGE) ×3 IMPLANT
TOWEL OR 17X24 6PK STRL BLUE (TOWEL DISPOSABLE) ×3 IMPLANT
TOWEL OR 17X26 10 PK STRL BLUE (TOWEL DISPOSABLE) ×3 IMPLANT
TRAY FOLEY CATH 14FR (SET/KITS/TRAYS/PACK) ×3 IMPLANT
WATER STERILE IRR 1000ML POUR (IV SOLUTION) ×9 IMPLANT

## 2012-01-27 NOTE — Plan of Care (Signed)
Problem: Consults Goal: Diagnosis- Total Joint Replacement Outcome: Completed/Met Date Met:  01/27/12 Primary Total Knee Left     

## 2012-01-27 NOTE — Interval H&P Note (Signed)
History and Physical Interval Note:  01/27/2012 7:04 AM  [name]  has presented today for surgery, with the diagnosis of DJD LEFT KNEE  The various methods of treatment have been discussed with the patient and family. After consideration of risks, benefits and other options for treatment, the patient has consented to  Procedure(s) (LRB): TOTAL KNEE ARTHROPLASTY (Left) as a surgical intervention .  The patients' history has been reviewed, patient examined, no change in status, stable for surgery.  I have reviewed the patients' chart and labs.  Questions were answered to the patient's satisfaction.     Salvatore Marvel A

## 2012-01-27 NOTE — Anesthesia Postprocedure Evaluation (Signed)
  Anesthesia Post-op Note  Patient: [name]  Procedure(s) Performed: Procedure(s) (LRB): TOTAL KNEE ARTHROPLASTY (Left) STEROID INJECTION (Right)  Patient Location: PACU  Anesthesia Type: GA combined with regional for post-op pain  Level of Consciousness: awake, oriented and patient cooperative  Airway and Oxygen Therapy: Patient Spontanous Breathing and Patient connected to nasal cannula oxygen  Post-op Pain: moderate  Post-op Assessment: Post-op Vital signs reviewed, Patient's Cardiovascular Status Stable, Respiratory Function Stable, Patent Airway, No signs of Nausea or vomiting and Pain level controlled  Post-op Vital Signs: stable  Complications: No apparent anesthesia complications

## 2012-01-27 NOTE — Anesthesia Procedure Notes (Addendum)
Procedure Name: Intubation Date/Time: 01/27/2012 7:38 AM Performed by: Gwenyth Allegra Pre-anesthesia Checklist: Patient identified, Timeout performed, Emergency Drugs available, Suction available and Patient being monitored Patient Re-evaluated:Patient Re-evaluated prior to inductionOxygen Delivery Method: Circle system utilized Preoxygenation: Pre-oxygenation with 100% oxygen Intubation Type: IV induction Ventilation: Oral airway inserted - appropriate to patient size and Mask ventilation without difficulty Laryngoscope Size: Mac and 4 Grade View: Grade II Tube type: Oral Tube size: 7.5 mm Number of attempts: 1 Airway Equipment and Method: Stylet Placement Confirmation: ETT inserted through vocal cords under direct vision,  breath sounds checked- equal and bilateral and positive ETCO2 Secured at: 21 cm Tube secured with: Tape Dental Injury: Teeth and Oropharynx as per pre-operative assessment    Anesthesia Regional Block:  Femoral nerve block  Pre-Anesthetic Checklist: ,, timeout performed, Correct Patient, Correct Site, Correct Laterality, Correct Procedure, Correct Position, site marked, Risks and benefits discussed,  Surgical consent,  Pre-op evaluation,  At surgeon's request and post-op pain management  Laterality: Left  Prep: Maximum Sterile Barrier Precautions used and chloraprep       Needles:  Injection technique: Single-shot  Needle Type: Stimulator Needle - 80        Needle insertion depth: 7 cm   Additional Needles:  Procedures: nerve stimulator Femoral nerve block  Nerve Stimulator or Paresthesia:  Response: 0.5 mA, 0.1 ms, 7 cm  Additional Responses:   Narrative:  Start time: 01/27/2012 7:00 AM End time: 01/27/2012 7:05 AM Injection made incrementally with aspirations every 5 mL.  Performed by: Personally  Anesthesiologist: Maren Beach MD  Additional Notes: 20cc 0.5% Marcaine w/ epi w/o discomfort or difficulty  GES

## 2012-01-27 NOTE — Progress Notes (Signed)
Orthopedic Tech Progress Note Patient Details:  [name] [birth year] [id number]  Patient ID: [name], female   DOB: January 23, 1961, 51 y.o.   MRN: [id number]  Patient in CPM at 1115. [name] 01/27/2012, 1:10 PM

## 2012-01-27 NOTE — Preoperative (Signed)
Beta Blockers   Reason not to administer Beta Blockers:Not Applicable 

## 2012-01-27 NOTE — Brief Op Note (Signed)
01/27/2012  9:11 AM  PATIENT:  [name]  51 y.o. female  PRE-OPERATIVE DIAGNOSIS:  DJD LEFT KNEE  POST-OPERATIVE DIAGNOSIS:  degenerative joint disease left knee  PROCEDURE:  Procedure(s) (LRB): TOTAL KNEE ARTHROPLASTY (Left) STEROID INJECTION (Right)  SURGEON:  Surgeon(s) and Role:    * Nilda Simmer, MD - Primary  PHYSICIAN ASSISTANT: Kirstin Shepperson PA-C  ASSISTANTS: Kirstin Shepperson PA-C   ANESTHESIA:   general  EBL:  Total I/O In: 1000 [I.V.:1000] Out: -   BLOOD ADMINISTERED:none  DRAINS: (2) Hemovact drain(s) in the left knee with  Suction Clamped   LOCAL MEDICATIONS USED:  NONE  SPECIMEN:  No Specimen  DISPOSITION OF SPECIMEN:  N/A  COUNTS:  YES  TOURNIQUET:   Total Tourniquet Time Documented: Thigh (Left) - 67 minutes  DICTATION: .Note written in EPIC  PLAN OF CARE: Admit to inpatient   PATIENT DISPOSITION:  PACU - hemodynamically stable.   Delay start of Pharmacological VTE agent (>24hrs) due to surgical blood loss or risk of bleeding: no

## 2012-01-27 NOTE — Progress Notes (Signed)
Orthopedic Tech Progress Note Patient Details:  [name] [birth year] [id number]  CPM Left Knee CPM Left Knee: On Left Knee Flexion (Degrees): 90  Left Knee Extension (Degrees): 0    Arrion Broaddus T 01/27/2012, 1:10 PM

## 2012-01-27 NOTE — Transfer of Care (Signed)
Immediate Anesthesia Transfer of Care Note  Patient: [name]  Procedure(s) Performed: Procedure(s) (LRB): TOTAL KNEE ARTHROPLASTY (Left) STEROID INJECTION (Right)  Patient Location: PACU  Anesthesia Type: General  Level of Consciousness: sedated  Airway & Oxygen Therapy: Patient Spontanous Breathing and Patient connected to nasal cannula oxygen  Post-op Assessment: Report given to PACU RN and Post -op Vital signs reviewed and stable  Post vital signs: Reviewed and stable  Complications: No apparent anesthesia complications222

## 2012-01-27 NOTE — H&P (View-Only) (Signed)
[name] is an 51 y.o. female.   Chief Complaint: left knee DJD HPI: [name] is a 51 year-old female who is currently scheduled for bilateral total knee replacements.  Her left one is scheduled for January 27, 2012 and her right one is scheduled for March 16, 2012.  She continues to be significantly debilitated by her bilateral knee pain.  The excruciating pain is getting progressively worse, limiting activities of daily living, poses a significant fall risk, and has failed multiple conservative treatments including intraarticular cortisone injections, intraarticular Supartz, bracing, medication and home physical therapy.  Past Medical History  Diagnosis Date  . Bronchitis   . Jones fracture     left foot fifth metatarsal  . Fibromyalgia   . Varicose vein     protrudes above skin-per pt;vein popped and bruised;ultrasound done to make sure that there were no clots;noclots were found  . Chronic back pain   . Multiple allergies   . Asthma     but pt states not severe enough to even have an inhaler  . Shortness of breath     with exertion;pt states that its related to her weight  . Hyperthyroidism     but pt doesn't take anything for it;thyroid nodules  . Headache   . Dizziness     rarely  . Impaired memory   . Joint pain   . Joint swelling   . GERD (gastroesophageal reflux disease)     doesn't take anything for it  . IBS (irritable bowel syndrome)   . Arthritis     bilateral knees  . Bruising     pt states unexplained d/t fibromyalgia  . Urinary frequency   . Increased thirst   . Anemia   . Anxiety     takes Ativan and Valium  . Depression     from Fibromyalgia diagnosis  . Insomnia     takes Ambien  . Sore gums     this is why pt is on Amoxil-only takes for dental work  . Hypertension     recently diagnosed (this month)    Past Surgical History  Procedure Date  . Cesarean section 1984  . Spine surgery 2004    Cervical plate, ACDF  . Knee surgery 2005    Left  knee arthroscopy  . Nasal septoplasty w/ turbinoplasty 2007  . Tonsillectomy 2007  . Fracture surgery 05/08/2010    Jones fracture left foot fifth metatarsal  . Abdominal hysterectomy     partial hysterectomy -mid 2000's  . Anterior lumbar fusion 09/20/2011    Procedure: ANTERIOR LUMBAR FUSION 1 LEVEL;  Surgeon: Carmela Hurt;  Location: [facility];  Service: Neurosurgery;  Laterality: N/A;  Lumbar five-Sacral One Anterior Lumbar Interbody Fusion /Dr. Early to Approach     Family History  Problem Relation Age of Onset  . Stroke Father   . Anesthesia problems Neg Hx   . Hypotension Neg Hx   . Malignant hyperthermia Neg Hx   . Pseudochol deficiency Neg Hx   . Arthritis Sister    Social History:  reports that she quit smoking about 13 years ago. She has never used smokeless tobacco. She reports that she does not drink alcohol or use illicit drugs.  Allergies:  Allergies  Allergen Reactions  . Shellfish Allergy Anaphylaxis  . Latex Other (See Comments)    Itching/rash  . Lortab Itching  . Other Other (See Comments)    MSG-    Medications Prior to Admission  Medication Sig Dispense Refill  . calcium-vitamin D (OSCAL WITH D) 500-200 MG-UNIT per tablet Take 1 tablet by mouth daily.      . celecoxib (CELEBREX) 200 MG capsule Take 200 mg by mouth daily.      . cyclobenzaprine (FLEXERIL) 10 MG tablet Take 10 mg by mouth 3 (three) times daily.        . diazepam (VALIUM) 10 MG tablet Take 10 mg by mouth 2 (two) times daily.        . diclofenac sodium (VOLTAREN) 1 % GEL Apply 1 application topically 2 (two) times daily as needed. For right shoulder and arm and legs      . DiphenhydrAMINE HCl (BENADRYL PO) Take 2 capsules by mouth 1 day or 1 dose. In case she comes in contact with shellfish or shrimp      . gabapentin (NEURONTIN) 300 MG capsule Take 300 mg by mouth 3 (three) times daily.        . hydrochlorothiazide (HYDRODIURIL) 25 MG tablet Take 25 mg by mouth daily. One tablet by  mouth in the morning.       Marland Kitchen HYDROcodone-acetaminophen (LORTAB) 7.5-500 MG per tablet Take 1 tablet by mouth every 6 (six) hours as needed. For pain      . LORazepam (ATIVAN) 1 MG tablet Take 1 mg by mouth as needed. For anxiety attacks      . OVER THE COUNTER MEDICATION Take 2 tablets by mouth every 6 (six) hours as needed. For allergy and sinus pressure [employer]       . zolpidem (AMBIEN) 10 MG tablet Take 10 mg by mouth at bedtime as needed. For sleep       No current facility-administered medications on file as of 01/14/2012.    No results found for this or any previous visit (from the past 48 hour(s)). No results found.  Review of Systems  Constitutional: Positive for malaise/fatigue. Negative for fever, chills, weight loss and diaphoresis.  HENT: Positive for tinnitus. Negative for hearing loss, ear pain, nosebleeds, congestion, sore throat, neck pain and ear discharge.   Eyes: Positive for blurred vision and double vision. Negative for photophobia, pain, discharge and redness.  Respiratory: Negative for cough, hemoptysis, sputum production, shortness of breath, wheezing and stridor.   Cardiovascular: Positive for leg swelling. Negative for chest pain, palpitations, orthopnea, claudication and PND.  Gastrointestinal: Positive for constipation. Negative for heartburn, nausea, vomiting, abdominal pain, diarrhea, blood in stool and melena.  Genitourinary: Negative for dysuria, urgency, frequency, hematuria and flank pain.  Musculoskeletal: Positive for back pain and joint pain. Negative for myalgias and falls.       Bilateral knee  Skin: Negative for itching and rash.  Neurological: Positive for dizziness, tingling, tremors, sensory change and headaches. Negative for speech change, focal weakness, seizures, loss of consciousness and weakness.  Endo/Heme/Allergies: Positive for environmental allergies and polydipsia. Bruises/bleeds easily.  Psychiatric/Behavioral: Positive for  depression and memory loss. Negative for suicidal ideas, hallucinations and substance abuse. The patient is nervous/anxious and has insomnia.     Blood pressure 151/104, pulse 112, temperature 97.9 F (36.6 C), height 5\' 2"  (1.575 m), weight 112.492 kg (248 lb), SpO2 99.00%. Physical Exam  Constitutional: She is oriented to person, place, and time. She appears well-developed and well-nourished.  HENT:  Head: Normocephalic and atraumatic.  Mouth/Throat: Oropharynx is clear and moist.  Eyes: Conjunctivae and EOM are normal. Pupils are equal, round, and reactive to light.  Neck: Neck supple.  Cardiovascular: Normal  rate, regular rhythm and intact distal pulses.   Respiratory: Effort normal.  GI: Soft. Bowel sounds are normal.  Genitourinary:       Not pertinent to current symptomatology therefore not examined.  Musculoskeletal:       .  She is independently ambulatory with a moderately antalgic gait.  No assistive devices today.  Evaluation of her neck shows cervical range of motion within normal limits.  Right shoulder has full range of motion.  Tenderness over her biceps insertion.  Positive impingement.  2+ radial pulse.  Bilateral equal strength.  Left shoulder has full range of motion without pain, weakness or deformity.  Both knees have active range of motion 0-120 degrees.  10/10 pain.  Medial joint line tenderness.  2+ crepitus.  2+ synovitis.  Normal patellar tracking.  No effusion.  She has 2+ dorsalis pedis pulses.  Currently bilateral equal sensory exam.    Neurological: She is alert and oriented to person, place, and time. She has normal reflexes.  Skin: Skin is warm and dry.  Psychiatric: She has a normal mood and affect. Her behavior is normal. Judgment and thought content normal.     Assessment Past Medical History  Diagnosis Date  . Bronchitis   . Jones fracture     left foot fifth metatarsal  . Fibromyalgia   . Varicose vein     protrudes above skin-per pt;vein popped  and bruised;ultrasound done to make sure that there were no clots;noclots were found  . Chronic back pain   . Multiple allergies   . Asthma     but pt states not severe enough to even have an inhaler  . Shortness of breath     with exertion;pt states that its related to her weight  . Hyperthyroidism     but pt doesn't take anything for it;thyroid nodules  . Headache   . Dizziness     rarely  . Impaired memory   . Joint pain   . Joint swelling   . GERD (gastroesophageal reflux disease)     doesn't take anything for it  . IBS (irritable bowel syndrome)   . Arthritis     bilateral knees  . Bruising     pt states unexplained d/t fibromyalgia  . Urinary frequency   . Increased thirst   . Anemia   . Anxiety     takes Ativan and Valium  . Depression     from Fibromyalgia diagnosis  . Insomnia     takes Ambien  . Sore gums     this is why pt is on Amoxil-only takes for dental work  . Hypertension     recently diagnosed (this month)    Plan Left total knee replacement.  The risks, benefits, and possible complications of the procedure were discussed in detail with the patient.  The patient is without question.  Pascal Lux 01/14/2012, 3:21 PM

## 2012-01-27 NOTE — Anesthesia Preprocedure Evaluation (Signed)
Anesthesia Evaluation    Airway       Dental   Pulmonary shortness of breath, asthma ,          Cardiovascular hypertension, Rhythm:regular Rate:Normal     Neuro/Psych PSYCHIATRIC DISORDERS  Neuromuscular disease    GI/Hepatic GERD-  ,  Endo/Other  Hyperthyroidism   Renal/GU      Musculoskeletal   Abdominal   Peds  Hematology   Anesthesia Other Findings   Reproductive/Obstetrics                           Anesthesia Physical Anesthesia Plan  ASA: II  Anesthesia Plan: General   Post-op Pain Management:    Induction: Intravenous  Airway Management Planned: LMA and Oral ETT  Additional Equipment:   Intra-op Plan:   Post-operative Plan:   Informed Consent: I have reviewed the patients History and Physical, chart, labs and discussed the procedure including the risks, benefits and alternatives for the proposed anesthesia with the patient or authorized representative who has indicated his/her understanding and acceptance.     Plan Discussed with: CRNA, Anesthesiologist and Surgeon  Anesthesia Plan Comments:         Anesthesia Quick Evaluation

## 2012-01-27 NOTE — Op Note (Signed)
NAMESARISSA, DERN               ACCOUNT NO.:  [id number]  MEDICAL RECORD NO.:  [id number]  LOCATION:  5001                         FACILITY:  [facility]  PHYSICIAN:  Rylinn Linzy A. Thurston Hole, M.D. DATE OF BIRTH:  04-12-1961  DATE OF PROCEDURE:  01/27/2012 DATE OF DISCHARGE:                              OPERATIVE REPORT   PREOPERATIVE DIAGNOSIS: 1. Left knee degenerative joint disease. 2. Right knee degenerative joint disease.  POSTOPERATIVE DIAGNOSIS: 1. Left knee degenerative joint disease. 2. Right knee degenerative joint disease.  PROCEDURE: 1. Left total knee replacement using DePuy cemented total knee system     with a number 3 cemented femur, number 3 cemented tibia with 12.5     mm polyethylene RP tibial spacer, and 32 mm polyethylene cemented     patella. 2. Zinacef impregnated cement. 3. Right knee cortisone injection.  SURGEON:  Elana Alm. Thurston Hole, M.D.  ASSISTANT:  Kirstin Shepperson, PA-C.  ANESTHESIA:  General.  OPERATIVE TIME:  1 hour 20 minutes.  COMPLICATIONS:  None.  DESCRIPTION OF PROCEDURE:  Ms. Bezold was brought to the operating room on January 27, 2012, after a femoral nerve block was placed on holding room by anesthesia.  She was placed on operative table in supine position.  After she was placed under general anesthesia, both knees were examined, range of motion from -5 to 125 degrees bilaterally. Knees were stable ligamentous exam with mild varus deformity and normal patellar tracking.  She had requested that we perform a right knee cortisone injection at the time of her anesthesia because she will have to put so much weight on her right knee and I agreed to do this.  We injected the right knee under sterile conditions of 80 mg of Depo-Medrol and 6 mL of 0.25% Marcaine.  She had a Foley catheter placed under sterile conditions.  Latex allergy precautions were taken.  Left knee was then prepped using sterile DuraPrep and draped using sterile technique.   Time-out procedure was called, and the correct left knee identified.  Left leg was exsanguinated and a thigh tourniquet elevated at 375 mm.  Initially, through a 15 cm longitudinal incision based over the patella on initial exposure was made.  The underlying subcutaneous tissues were incised along with skin incision.  The median arthrotomy was performed revealing an excessive amount of normal-appearing joint fluid.  The articular surfaces were inspected.  She had grade 4 changes medially, grade 3 and 4 changes laterally, and in the patellofemoral joint.  Osteophytes removed from the femoral condyles and tibial plateau.  The medial and lateral meniscal remnants removed as well as the anterior cruciate ligament.  Intramedullary drill was then drilled up the femoral canal for placement of distal femoral cutting jig, which was placed in appropriate manner rotation and a distal 10 mm cut was made.  The distal femur was then sized.  Number 3 was found to be the appropriate size.  Number 3 cutting jig was placed in the appropriate manner of external rotation, then these cuts were made.  The proximal tibia was then exposed.  The tibial spines were removed with an oscillating saw.  Intramedullary drill was drilled down the tibial canal for placement  of proximal tibial cutting jig which was placed in the appropriate manner rotation and a proximal 4 mm cut was made based off the medial or lower side.  Spacer blocks were then placed in flexion and extension.  12.5 mm blocks gave excellent balancing, excellent stability, and excellent correction reflection of varus deformities.  At this point, the number 3 tibial base plate was placed on the cut tibial surface with an excellent fit and a keel cut was made.  The PCL box cutter was then placed on the distal femur and these cuts were made.  At this point, the number 3 femoral trial was placed, and with that number 3 tibial base plate trial and a 12.5 mm  polyethylene RP tibial spacer, knee was reduced, taken through range of motion from 0-125 degrees with excellent stability and excellent correction of her flexion and varus deformities and normal patellar tracking.  A resurfacing 9 mm cut was then made on the patella and 3 locking holes placed for a 32 mm polyethylene patellar trial.  After this was placed, again patellofemoral tracking was evaluated and found to be normal.  At this point, it is felt that all the trial components were of excellent size, fit, and stability.  They were then removed.  The knee was then jet lavage irrigated with 3 L of saline.  The proximal tibia was then exposed, number 3 tibial base plate with Zinacef impregnated cement backing was hammered in position with an excellent fit with excess cement being removed from around the edges.  Number 3 femoral component with cement backing was hammered into position also with an excellent fit with excess cement being removed around the edges.  The 12.5 mm polyethylene RP tibial spacer was placed on tibial baseplate.  The knee reduced, taken through range of motion from 0-125 degrees with excellent stability and excellent correction of her flexion and varus deformities. The 32 mm polyethylene cement backed patella was then placed in its position and held there with a clamp.  After the cement hardened, again patellofemoral tracking was again evaluated and found to be normal.  At this point, it was felt that all the components were of excellent size, fit, and stability.  The wound was further irrigated with saline, and the tourniquet was released.  Hemostasis was obtained with cautery.  The arthrotomy was then closed with 0 Vicryl and running V-Loc 1-0 suture. Subcutaneous tissues closed with 0 and 2-0 Vicryl.  Subcuticular layer closed with 4-0 Monocryl.  Sterile dressings were applied and a long-leg splint.  The patient then awakened, extubated, and taken to recovery  in stable condition.  Needle, sponge count was correct x2 at the end the case.  Neurovascular status normal.  Pulses 2+ and symmetric.     Eddi Hymes A. Thurston Hole, M.D.     RAW/MEDQ  D:  01/27/2012  T:  01/27/2012  Job:  [id number]

## 2012-01-28 ENCOUNTER — Encounter (HOSPITAL_COMMUNITY): Payer: Self-pay | Admitting: Orthopedic Surgery

## 2012-01-28 DIAGNOSIS — D62 Acute posthemorrhagic anemia: Secondary | ICD-10-CM | POA: Diagnosis present

## 2012-01-28 DIAGNOSIS — E876 Hypokalemia: Secondary | ICD-10-CM | POA: Diagnosis not present

## 2012-01-28 LAB — CBC
HCT: 30.2 % — ABNORMAL LOW (ref 36.0–46.0)
Hemoglobin: 9.4 g/dL — ABNORMAL LOW (ref 12.0–15.0)
MCH: 24.4 pg — ABNORMAL LOW (ref 26.0–34.0)
MCHC: 31.1 g/dL (ref 30.0–36.0)
MCV: 78.4 fL (ref 78.0–100.0)
Platelets: 256 10*3/uL (ref 150–400)
RBC: 3.85 MIL/uL — ABNORMAL LOW (ref 3.87–5.11)
RDW: 15.6 % — ABNORMAL HIGH (ref 11.5–15.5)
WBC: 12.4 10*3/uL — ABNORMAL HIGH (ref 4.0–10.5)

## 2012-01-28 LAB — BASIC METABOLIC PANEL
BUN: 7 mg/dL (ref 6–23)
CO2: 27 mEq/L (ref 19–32)
Calcium: 7.9 mg/dL — ABNORMAL LOW (ref 8.4–10.5)
Chloride: 103 mEq/L (ref 96–112)
Creatinine, Ser: 0.58 mg/dL (ref 0.50–1.10)
GFR calc Af Amer: >90 mL/min (ref >90)
GFR calc non Af Amer: >90 mL/min (ref >90)
Glucose, Bld: 106 mg/dL — ABNORMAL HIGH (ref 70–99)
Potassium: 2.7 mEq/L — CL (ref 3.5–5.1)
Sodium: 139 mEq/L (ref 135–145)

## 2012-01-28 LAB — URINE CULTURE
Colony Count: NO GROWTH
Culture  Setup Time: 201304220854
Culture: NO GROWTH

## 2012-01-28 MED ORDER — SODIUM CHLORIDE 0.9 % IV BOLUS (SEPSIS)
500.0000 mL | Freq: Once | INTRAVENOUS | Status: AC
  Administered 2012-01-28: 500 mL via INTRAVENOUS

## 2012-01-28 MED ORDER — POTASSIUM CHLORIDE CRYS ER 20 MEQ PO TBCR
40.0000 meq | EXTENDED_RELEASE_TABLET | Freq: Two times a day (BID) | ORAL | Status: DC
  Administered 2012-01-28 (×2): 40 meq via ORAL
  Filled 2012-01-28 (×4): qty 2

## 2012-01-28 MED ORDER — SODIUM CHLORIDE 0.9 % IV BOLUS (SEPSIS): 500.0000 mL | Freq: Once | INTRAVENOUS | Status: DC

## 2012-01-28 MED ORDER — CALCIUM CARBONATE-VITAMIN D 500-200 MG-UNIT PO TABS
2.0000 | ORAL_TABLET | Freq: Two times a day (BID) | ORAL | Status: DC
  Administered 2012-01-28 – 2012-01-30 (×5): 2 via ORAL
  Filled 2012-01-28 (×7): qty 2

## 2012-01-28 MED ORDER — POTASSIUM CHLORIDE CRYS ER 20 MEQ PO TBCR
40.0000 meq | EXTENDED_RELEASE_TABLET | Freq: Once | ORAL | Status: AC
  Administered 2012-01-28: 40 meq via ORAL
  Filled 2012-01-28: qty 2

## 2012-01-28 MED FILL — White Petrolatum Gel: Qty: 28.35 | Status: AC

## 2012-01-28 NOTE — Progress Notes (Signed)
Physical Therapy Evaluation Patient Details Name: [name] MRN: [id number] DOB: 1961/02/10 Today's Date: 01/28/2012 Time: 4098-1191 PT Time Calculation (min): 36 min  PT Assessment / Plan / Recommendation Clinical Impression  Pt is a 51 y/o female admitted s/p left TKA along with the below PT problem list.  Pt would benefit from acute PT to maximize independence and facilitate d/c home with HHPT.    PT Assessment  Patient needs continued PT services    Follow Up Recommendations  Home health PT    Equipment Recommendations  None recommended by PT    Frequency 7X/week    Precautions / Restrictions Precautions Precautions: Knee Precaution Booklet Issued: No Required Braces or Orthoses: Knee Immobilizer - Left Knee Immobilizer - Left: Discontinue post op day 2 Restrictions Weight Bearing Restrictions: Yes LLE Weight Bearing: Weight bearing as tolerated    Pertinent Vitals/Pain 10/10 in left knee.  Pt premedicated, repositioned, and RN notified of need for more medication.      Mobility  Bed Mobility Bed Mobility: Supine to Sit Supine to Sit: 1: +2 Total assist;With rails;HOB flat Supine to Sit: Patient Percentage: 50% Details for Bed Mobility Assistance: Assist for left LE due to pain as well as hips to rotate to EOB with max cues for sequence. Transfers Transfers: Sit to Stand;Stand to Sit Sit to Stand: 1: +2 Total assist;With upper extremity assist;From bed Sit to Stand: Patient Percentage: 60% Stand to Sit: 1: +2 Total assist;With upper extremity assist;To chair/3-in-1 Stand to Sit: Patient Percentage: 80% Details for Transfer Assistance: Assist for balance with cues for hand/left LE placement.  Limited by pain. Ambulation/Gait Ambulation/Gait Assistance: 4: Min assist Ambulation Distance (Feet): 15 Feet Assistive device: Rolling walker Ambulation/Gait Assistance Details: Assist for balance with cues for sequence and tall posture as well as safety inside  RW. Gait Pattern: Step-to pattern;Decreased step length - left;Decreased stance time - left;Trunk flexed Stairs: No Wheelchair Mobility Wheelchair Mobility: No    Exercises Total Joint Exercises Ankle Circles/Pumps: AAROM;Left;10 reps;Supine Quad Sets: AAROM;Left;10 reps;Supine Heel Slides: AAROM;Left;10 reps;Supine   PT Goals Acute Rehab PT Goals PT Goal Formulation: With patient Time For Goal Achievement: 02/04/12 Potential to Achieve Goals: Good Pt will go Supine/Side to Sit: with modified independence PT Goal: Supine/Side to Sit - Progress: Goal set today Pt will go Sit to Supine/Side: with modified independence PT Goal: Sit to Supine/Side - Progress: Goal set today Pt will go Sit to Stand: with modified independence PT Goal: Sit to Stand - Progress: Goal set today Pt will go Stand to Sit: with modified independence PT Goal: Stand to Sit - Progress: Goal set today Pt will Ambulate: >150 feet;with modified independence;with least restrictive assistive device PT Goal: Ambulate - Progress: Goal set today Pt will Go Up / Down Stairs: 3-5 stairs;with min assist;with least restrictive assistive device PT Goal: Up/Down Stairs - Progress: Goal set today Pt will Perform Home Exercise Program: Independently PT Goal: Perform Home Exercise Program - Progress: Goal set today  Visit Information  Last PT Received On: 01/28/12 Assistance Needed: +2    Subjective Data  Subjective: "Is 10 the highest number for pain?" Patient Stated Goal: Decrease pain.   Prior Functioning  Home Living Lives With: Alone Available Help at Discharge: Other (Comment) (Aunt trying to come up from [place].) Type of Home: House Home Access: Stairs to enter [employer] of Steps: 5 Entrance Stairs-Rails: Right;Left Home Layout: One level Bathroom Shower/Tub: Forensic scientist: Standard Home Adaptive Equipment: Straight cane;Walker -  rolling;Bedside commode/3-in-1;Tub  transfer bench;Crutches Prior Function Level of Independence: Independent Able to Take Stairs?: Yes Driving: Yes Vocation: On disability Communication Communication: No difficulties Dominant Hand: Right    Cognition  Overall Cognitive Status: Appears within functional limits for tasks assessed/performed Arousal/Alertness: Awake/alert Orientation Level: Oriented X4 / Intact Behavior During Session: [facility] for tasks performed    Extremity/Trunk Assessment Right Upper Extremity Assessment RUE ROM/Strength/Tone: Within functional levels RUE Sensation: WFL - Light Touch RUE Coordination: WFL - gross/fine motor Left Upper Extremity Assessment LUE ROM/Strength/Tone: Within functional levels LUE Sensation: WFL - Light Touch LUE Coordination: WFL - gross/fine motor Right Lower Extremity Assessment RLE ROM/Strength/Tone: Within functional levels RLE Sensation: WFL - Light Touch RLE Coordination: WFL - gross/fine motor Left Lower Extremity Assessment LLE ROM/Strength/Tone: Deficits;Due to pain LLE ROM/Strength/Tone Deficits: 2-/5. AA/ROM left knee 0-20 degrees. LLE Sensation: WFL - Light Touch LLE Coordination: WFL - gross motor Trunk Assessment Trunk Assessment: Normal   Balance Balance Balance Assessed: No  End of Session PT - End of Session Equipment Utilized During Treatment: Gait belt;Left knee immobilizer Activity Tolerance: Patient tolerated treatment well Patient left: in chair;with call bell/phone within reach Nurse Communication: Mobility status;Patient requests pain meds CPM Left Knee CPM Left Knee: Off   Cephus Shelling 01/28/2012, 12:30 PM  01/28/2012 Cephus Shelling, PT, DPT [phone]

## 2012-01-28 NOTE — Clinical Social Work Psychosocial (Signed)
Clinical Social Work Department BRIEF PSYCHOSOCIAL ASSESSMENT 01/28/2012  Patient:  [name], [name]     Account Number:  [id number]     Admit date:  01/27/2012  Clinical Social Worker:  Andres Shad  Date/Time:  01/28/2012 01:39 PM  Referred by:  Physician  Date Referred:  01/28/2012 Referred for  SNF Placement   Other Referral:   Interview type:  Patient Other interview type:   Treatment team  Family in room during assessment    PSYCHOSOCIAL DATA Living Status:  FAMILY Admitted from facility:   Level of care:   Primary support name:  sister Primary support relationship to patient:  FAMILY Degree of support available:   very supportive, very large family with multiple members in the room    CURRENT CONCERNS Current Concerns  Post-Acute Placement   Other Concerns:    SOCIAL WORK ASSESSMENT / PLAN CSW received referral for placement for patient in skilled nursing for rehab.  Met with patient at the bedside along with other family members who report patient has already toured [facility] and started paperwork.  Reports this will be the facility she will discharge too.  Explained CSW role and process, made phone call to Jasmine December at [facility] who also confirms patient has completed some paperwork and anti tic anticipating patient for admission on 4/25.  Will complete additional paperwork for placement purposes and assist with dc planning.   Assessment/plan status:  Psychosocial Support/Ongoing Assessment of Needs Other assessment/ plan:   Information/referral to community resources:   FL2 updated and faxed to facility    PATIENT'S/FAMILY'S RESPONSE TO PLAN OF CARE: All agreeable to placement at [facility] and facility also aware and agreeable.    Ashley Jacobs, MSW LCSW [phone]  For Dede Query

## 2012-01-28 NOTE — Clinical Social Work Placement (Signed)
Clinical Social Work Department CLINICAL SOCIAL WORK PLACEMENT NOTE 01/28/2012  Patient:  [name], [name]  Account Number:  [id number] Admit date:  01/27/2012  Clinical Social Worker:  Ashley Jacobs, LCSW  Date/time:  01/28/2012 01:43 PM  Clinical Social Work is seeking post-discharge placement for this patient at the following level of care:   SKILLED NURSING   (*CSW will update this form in Epic as items are completed)   01/28/2012  Patient/family provided with [facility] Department of Clinical Social Work's list of facilities offering this level of care within the geographic area requested by the patient (or if unable, by the patient's family).  01/28/2012  Patient/family informed of their freedom to choose among providers that offer the needed level of care, that participate in Medicare, Medicaid or managed care program needed by the patient, have an available bed and are willing to accept the patient.  01/28/2012  Patient/family informed of [facility]' ownership interest in [facility], as well as of the fact that they are under no obligation to receive care at this facility.  PASARR submitted to EDS on 01/28/2012 PASARR number received from EDS on   FL2 transmitted to all facilities in geographic area requested by pt/family on  01/28/2012 FL2 transmitted to all facilities within larger geographic area on   Patient informed that his/her managed care company has contracts with or will negotiate with  certain facilities, including the following:     Patient/family informed of bed offers received:   Patient chooses bed at  Physician recommends and patient chooses bed at    Patient to be transferred to  on   Patient to be transferred to facility by   The following physician request were entered in Epic:   Additional Comments:

## 2012-01-28 NOTE — Progress Notes (Signed)
Patient ID: [name], female   DOB: [birth year], 51 y.o.   MRN: [id number] PATIENT ID: [name]  MRN: [id number]  DOB/AGE:  07-09-1961 / 51 y.o.  1 Day Post-Op Procedure(s) (LRB): TOTAL KNEE ARTHROPLASTY (Left) STEROID INJECTION (Right)    PROGRESS NOTE Subjective: Patient is alert, oriented, no Nausea, no Vomiting, yes} passing gas, no Bowel Movement. Taking PO not yet. Denies SOB, Chest or Calf Pain. Using Incentive Spirometer, PAS in place. Ambulate not yet, CPM 0-90 Patient reports pain as 10 on 0-10 scale  .    Objective: Vital signs in last 24 hours: Filed Vitals:   01/27/12 1140 01/27/12 2000 01/27/12 2149 01/28/12 0603  BP: 124/78  136/84 121/76  Pulse: 81  102 102  Temp: 97.5 F (36.4 C)  98.3 F (36.8 C) 99.5 F (37.5 C)  TempSrc: Oral  Oral   Resp: 12  19 20   Height:  5\' 2"  (1.575 m)    Weight:  111.6 kg (246 lb 0.5 oz)    SpO2: 97%  97% 91%      Intake/Output from previous day: I/O last 3 completed shifts: In: 3430 [P.O.:960; I.V.:2470] Out: 2100 [Urine:1950; Drains:150]   Intake/Output this shift: Total I/O In: -  Out: 25 [Drains:25]   LABORATORY DATA:  Basename 01/28/12 0548  WBC 12.4*  HGB 9.4*  HCT 30.2*  PLT 256  NA 139  K 2.7*  CL 103  CO2 27  BUN 7  CREATININE 0.58  GLUCOSE 106*  GLUCAP --  INR --  CALCIUM 7.9*    Examination: ABD soft Neurovascular intact Sensation intact distally Intact pulses distally Dorsiflexion/Plantar flexion intact Incision: dressing C/D/I}  Assessment:   1 Day Post-Op Procedure(s) (LRB): TOTAL KNEE ARTHROPLASTY (Left) STEROID INJECTION (Right) ADDITIONAL DIAGNOSIS:  Acute Blood Loss Anemia, Hypokalemia, Hypertension and anxiety  Plan: PT/OT WBAT, CPM 5/hrs day until ROM 0-90 degrees,  DVT Prophylaxis:  SCDx72hr\lovenox DISCHARGE PLAN: Skilled Nursing Facility/Rehab DISCHARGE NEEDS: KDur today   calcium today       [name] J 01/28/2012, 8:03 AM

## 2012-01-28 NOTE — Progress Notes (Signed)
Physical Therapy Treatment Patient Details Name: [name] MRN: [id number] DOB: 14-Nov-1960 Today's Date: 01/28/2012 Time: 7829-5621 PT Time Calculation (min): 46 min  PT Assessment / Plan / Recommendation Comments on Treatment Session  Patient with increased ambulation and decreased assist for transfers.  Hard time with bed mobility due to fatigue and falling asleep once in bed.  Patient asserts she is going to [facility] for couple of weeks prior to d/c home.    Follow Up Recommendations  Skilled nursing facility    Equipment Recommendations  Defer to next venue    Frequency     Plan Discharge plan needs to be updated    Precautions / Restrictions Precautions Precautions: Knee Required Braces or Orthoses: Knee Immobilizer - Left Knee Immobilizer - Left: Discontinue post op day 2 Restrictions LLE Weight Bearing: Weight bearing as tolerated   Pertinent Vitals/Pain 4/10 initially with ambulation in knee (increased into feet with time due to fatigue and back issues.)    Mobility  Bed Mobility Sit to Supine: 3: Mod assist Details for Bed Mobility Assistance: max verbal cues for technique, repositioning for proper place in bed Transfers Sit to Stand: With upper extremity assist;From bed;From chair/3-in-1;4: Min assist Stand to Sit: 4: Min assist;To chair/3-in-1;To bed Ambulation/Gait Ambulation/Gait Assistance: 4: Min assist Ambulation Distance (Feet): 50 Feet Assistive device: Rolling walker Ambulation/Gait Assistance Details: super slow, antalgic, decreased foot clearance bilaterally, but motivated to do as much as she could; lumbar hyperextension due to anterior pelvis and attempts at upright posture cause increased hyperextension. Gait Pattern: Step-to pattern;Decreased stride length    Exercises     PT Goals Acute Rehab PT Goals Time For Goal Achievement: 02/04/12 Pt will go Sit to Supine/Side: with modified independence PT Goal: Sit to Supine/Side - Progress:  Progressing toward goal Pt will go Stand to Sit: with modified independence PT Goal: Stand to Sit - Progress: Progressing toward goal Pt will Ambulate: >150 feet;with modified independence;with least restrictive assistive device PT Goal: Ambulate - Progress: Progressing toward goal  Visit Information  Last PT Received On: 01/28/12 Assistance Needed: +1    Subjective Data  Subjective: Going to [facility] then home for few weeks then will be back for the other knee.   Cognition  Overall Cognitive Status: Appears within functional limits for tasks assessed/performed Arousal/Alertness: Awake/alert Orientation Level: Appears intact for tasks assessed Behavior During Session: [facility] for tasks performed    Balance  Balance Balance Assessed: Yes Dynamic Standing Balance Dynamic Standing - Balance Support: No upper extremity supported;Right upper extremity supported;During functional activity Dynamic Standing - Level of Assistance: 5: Stand by assistance Dynamic Standing - Comments: stood at sink to wash hands after toileting; also stood with wide BOS for perineal hygiene after toileting with min assist (plus assist to steady walker with one UE supported.)  End of Session PT - End of Session Equipment Utilized During Treatment: Gait belt Activity Tolerance: Patient limited by fatigue Patient left: in bed;with call bell/phone within reach CPM Left Knee CPM Left Knee: On Left Knee Flexion (Degrees): 65  Left Knee Extension (Degrees): 0     Idamae Coccia,CYNDI 01/28/2012, 3:18 PM

## 2012-01-28 NOTE — Progress Notes (Signed)
UR COMPLETED  

## 2012-01-28 NOTE — Progress Notes (Addendum)
Occupational Therapy Evaluation Patient Details Name: [name] MRN: [id number] DOB: May 07, 1961 Today's Date: 01/28/2012 Time: 1720- 1750  30 min    OT Assessment / Plan / Recommendation Clinical Impression  51 yo s/p R TKA. s/p L TKA. Pt with multiple medical problems in past..Pt will benefit from OT secondary to deficits below to max indep with ADL and functional mobility for ADL to facilitate D/C toSNF for rehab.    OT Assessment  Patient needs continued OT Services    Follow Up Recommendations  Skilled nursing facility    Equipment Recommendations  Defer to next venue    Frequency Min 2X/week    Precautions / Restrictions Precautions Precautions: Knee Precaution Booklet Issued: No Required Braces or Orthoses: Knee Immobilizer - Left Knee Immobilizer - Left: Discontinue post op day 2 Restrictions Weight Bearing Restrictions: Yes LLE Weight Bearing: Weight bearing as tolerated   Pertinent Vitals/Pain     ADL  Eating/Feeding: Performed;Independent Where Assessed - Eating/Feeding: Bed level Grooming: Simulated;Set up Where Assessed - Grooming: Supine, head of bed up Upper Body Bathing: Simulated;Set up Where Assessed - Upper Body Bathing: Supine, head of bed up Lower Body Bathing: Simulated;Moderate assistance Where Assessed - Lower Body Bathing: Sitting, bed Upper Body Dressing: Simulated;Set up Where Assessed - Upper Body Dressing: Sitting, bed Lower Body Dressing: Simulated;Moderate assistance Where Assessed - Lower Body Dressing: Sitting, bed Toilet Transfer: Simulated;Minimal assistance Toilet Transfer Method: Ambulating Toilet Transfer Equipment: Bedside commode Toileting - Clothing Manipulation: Simulated;Supervision/safety Where Assessed - Toileting Clothing Manipulation: Standing Toileting - Hygiene: Simulated;Independent Equipment Used: Other (comment) (educated on AE. Pt has AE at home but has not used it.) Ambulation Related to ADLs: Min A RW  level. ADL Comments: Will benefit from AE.    OT Goals Acute Rehab OT Goals Time For Goal Achievement: 02/04/12 Potential to Achieve Goals: Good ADL Goals Pt Will Perform Lower Body Bathing: with supervision;Sitting, edge of bed;Sit to stand from bed;with cueing (comment type and amount);with adaptive equipment;Unsupported ADL Goal: Lower Body Bathing - Progress: Goal set today Pt Will Perform Lower Body Dressing: with supervision;Unsupported;with adaptive equipment;Sit to stand from bed ADL Goal: Lower Body Dressing - Progress: Goal set today Pt Will Transfer to Toilet: with modified independence;Ambulation;with DME;3-in-1 ADL Goal: Toilet Transfer - Progress: Goal set today Pt Will Perform Toileting - Clothing Manipulation: with modified independence;Standing ADL Goal: Toileting - Clothing Manipulation - Progress: Goal set today Pt Will Perform Toileting - Hygiene: Independently;Standing at 3-in-1/toilet ADL Goal: Toileting - Hygiene - Progress: Goal set today  Visit Information  Last OT Received On: 01/28/12 Assistance Needed: +1    Subjective Data      Prior Functioning  Home Living Lives With: Alone Available Help at Discharge: Other (Comment) Type of Home: House Home Access: Stairs to enter [employer] of Steps: 5 Entrance Stairs-Rails: Right;Left Home Layout: One level Bathroom Shower/Tub: Forensic scientist: Standard Bathroom Accessibility: Yes How Accessible: Accessible via walker Home Adaptive Equipment: Straight cane;Walker - rolling;Bedside commode/3-in-1;Tub transfer bench;Crutches Prior Function Level of Independence: Independent Able to Take Stairs?: Yes Driving: Yes Vocation: On disability Comments: PT states she has to do everything in intervals bc of fibromyalgia, etc. Communication Communication: No difficulties Dominant Hand: Right    Cognition  Overall Cognitive Status: Appears within functional limits for tasks  assessed/performed Arousal/Alertness: Other (comment) (lethargic due to pain meds) Orientation Level: Appears intact for tasks assessed Behavior During Session: [facility] for tasks performed    Extremity/Trunk Assessment Right Upper Extremity Assessment RUE  ROM/Strength/Tone: Within functional levels RUE Sensation: WFL - Light Touch RUE Coordination: WFL - gross/fine motor Left Upper Extremity Assessment LUE ROM/Strength/Tone: Within functional levels LUE Sensation: WFL - Light Touch LUE Coordination: WFL - gross/fine motor RLE Coordination: WFL - gross/fine motor   Mobility Bed Mobility Sit to Supine: 3: Mod assist Details for Bed Mobility Assistance: max verbal cues for technique, repositioning for proper place in bed Transfers Transfers: Sit to Stand;Stand to Sit Sit to Stand: With upper extremity assist;From bed;From chair/3-in-1;4: Min assist Sit to Stand: Patient Percentage: 90% Stand to Sit: 4: Min assist;To chair/3-in-1;To bed   Exercise    Balance   End of Session Pt tolerated well. Left in bed with call bell within reach.   Mekai Wilkinson,HILLARY 01/28/2012, 6:06 PM [facility], OTR/L  [phone] 01/28/2012

## 2012-01-29 LAB — BASIC METABOLIC PANEL
BUN: 6 mg/dL (ref 6–23)
CO2: 25 mEq/L (ref 19–32)
Calcium: 8.6 mg/dL (ref 8.4–10.5)
Chloride: 107 mEq/L (ref 96–112)
Creatinine, Ser: 0.64 mg/dL (ref 0.50–1.10)
GFR calc Af Amer: >90 mL/min (ref >90)
GFR calc non Af Amer: >90 mL/min (ref >90)
Glucose, Bld: 130 mg/dL — ABNORMAL HIGH (ref 70–99)
Potassium: 3.2 mEq/L — ABNORMAL LOW (ref 3.5–5.1)
Sodium: 141 mEq/L (ref 135–145)

## 2012-01-29 LAB — CBC
HCT: 28.7 % — ABNORMAL LOW (ref 36.0–46.0)
Hemoglobin: 9.1 g/dL — ABNORMAL LOW (ref 12.0–15.0)
MCH: 25.1 pg — ABNORMAL LOW (ref 26.0–34.0)
MCHC: 31.7 g/dL (ref 30.0–36.0)
MCV: 79.3 fL (ref 78.0–100.0)
Platelets: 242 10*3/uL (ref 150–400)
RBC: 3.62 MIL/uL — ABNORMAL LOW (ref 3.87–5.11)
RDW: 15.9 % — ABNORMAL HIGH (ref 11.5–15.5)
WBC: 11.9 10*3/uL — ABNORMAL HIGH (ref 4.0–10.5)

## 2012-01-29 MED ORDER — POTASSIUM CHLORIDE CRYS ER 20 MEQ PO TBCR
40.0000 meq | EXTENDED_RELEASE_TABLET | Freq: Two times a day (BID) | ORAL | Status: DC
  Administered 2012-01-29 – 2012-01-30 (×3): 40 meq via ORAL
  Filled 2012-01-29 (×3): qty 2

## 2012-01-29 NOTE — Progress Notes (Signed)
CSW spoke with [facility] and the bed offer had to be rescinded due to liability claim. CSW addressed this with pt and she is agreeable to home health services as well as this CSW sending request for other bed offers to [facility]. List of facilities has been provided to pt. CSW sent request to county. CSW will continue to follow.  Dede Query, MSW, Theresia Majors [phone]

## 2012-01-29 NOTE — Progress Notes (Signed)
Physical Therapy Treatment Patient Details Name: [name] MRN: [id number] DOB: [birth year] Today's Date: 01/29/2012 Time: 4098-1191 PT Time Calculation (min): 29 min  PT Assessment / Plan / Recommendation Comments on Treatment Session  Pt admitted s/p left TKA and is motivated to progress.  Pt able to tolerate increased ambulation distance with increased independence today.      Follow Up Recommendations  Skilled nursing facility    Equipment Recommendations  Defer to next venue    Frequency 7X/week   Plan Discharge plan remains appropriate;Frequency remains appropriate    Precautions / Restrictions Precautions Precautions: Knee Precaution Booklet Issued: No Required Braces or Orthoses: Knee Immobilizer - Left Knee Immobilizer - Left: Discontinue post op day 2 Restrictions Weight Bearing Restrictions: Yes LLE Weight Bearing: Weight bearing as tolerated    Pertinent Vitals/Pain 0/10 with treatment.    Mobility  Bed Mobility Bed Mobility: Sit to Supine Sit to Supine: 4: Min assist;HOB flat Details for Bed Mobility Assistance: Assist for left LE due to weakness with cues for sequence. Transfers Transfers: Sit to Stand;Stand to Sit Sit to Stand: 4: Min assist;With upper extremity assist;From bed Stand to Sit: 4: Min assist;With upper extremity assist;To bed Details for Transfer Assistance: Assist for balance with cues for hand/left LE placement. Ambulation/Gait Ambulation/Gait Assistance: 4: Min guard Ambulation Distance (Feet): 100 Feet Assistive device: Rolling walker Ambulation/Gait Assistance Details: Guarding for balance with cues for step-through sequence as well as initial contact on left heel. Gait Pattern: Step-through pattern;Decreased step length - left;Decreased stance time - left Stairs: No Wheelchair Mobility Wheelchair Mobility: No    Exercises Total Joint Exercises Long Arc Quad: AAROM;Left;10 reps;Seated Knee Flexion: AAROM;Left;10 reps;Supine  (AA/ROM left knee 0-57 degrees.)   PT Goals Acute Rehab PT Goals PT Goal Formulation: With patient Time For Goal Achievement: 02/04/12 Potential to Achieve Goals: Good PT Goal: Sit to Supine/Side - Progress: Progressing toward goal PT Goal: Sit to Stand - Progress: Progressing toward goal PT Goal: Stand to Sit - Progress: Progressing toward goal PT Goal: Ambulate - Progress: Progressing toward goal PT Goal: Perform Home Exercise Program - Progress: Progressing toward goal  Visit Information  Last PT Received On: 01/29/12 Assistance Needed: +1    Subjective Data  Subjective: "I will go to [facility] tomorrow." Patient Stated Goal: Decrease pain.   Cognition  Overall Cognitive Status: Appears within functional limits for tasks assessed/performed Arousal/Alertness: Awake/alert Orientation Level: Oriented X4 / Intact Behavior During Session: [facility] for tasks performed    Balance  Balance Balance Assessed: No  End of Session PT - End of Session Equipment Utilized During Treatment: Gait belt Activity Tolerance: Patient tolerated treatment well Patient left: in bed;with call bell/phone within reach;with family/visitor present Nurse Communication: Mobility status    Cephus Shelling 01/29/2012, 1:36 PM  01/29/2012 Cephus Shelling, PT, DPT [phone]

## 2012-01-29 NOTE — Progress Notes (Signed)
Patient ID: [name], female   DOB: 22-Apr-1961, 51 y.o.   MRN: [id number] PATIENT ID: [name]  MRN: [id number]  DOB/AGE:  11/02/1960 / 51 y.o.  2 Days Post-Op Procedure(s) (LRB): TOTAL KNEE ARTHROPLASTY (Left) STEROID INJECTION (Right)    PROGRESS NOTE Subjective: Patient is alert, oriented, no Nausea, no Vomiting, yes} passing gas, no Bowel Movement. Taking PO yes. Denies SOB, Chest or Calf Pain. Using Incentive Spirometer, PAS in place. Ambulate well, CPM 0-70 Patient reports pain as 4 on 0-10 scale  .    Objective: Vital signs in last 24 hours: Filed Vitals:   01/28/12 0603 01/28/12 1449 01/28/12 2201 01/29/12 0520  BP: 121/76 128/82 133/91 117/64  Pulse: 102 104 112 86  Temp: 99.5 F (37.5 C) 97.8 F (36.6 C) 98.7 F (37.1 C) 99.1 F (37.3 C)  TempSrc:    Oral  Resp: 20 16 19 20   Height:      Weight:      SpO2: 91% 94% 96% 93%      Intake/Output from previous day: I/O last 3 completed shifts: In: 3020 [P.O.:1920; I.V.:1100] Out: 2075 [Urine:2050; Drains:25]   Intake/Output this shift:     LABORATORY DATA:  Basename 01/29/12 0600 01/28/12 0548  WBC 11.9* 12.4*  HGB 9.1* 9.4*  HCT 28.7* 30.2*  PLT 242 256  NA 141 139  K 3.2* 2.7*  CL 107 103  CO2 25 27  BUN 6 7  CREATININE 0.64 0.58  GLUCOSE 130* 106*  GLUCAP -- --  INR -- --  CALCIUM 8.6 --    Examination: ABD soft Neurovascular intact Sensation intact distally Intact pulses distally Incision: dressing C/D/I and scant drainage}  Assessment:   2 Days Post-Op Procedure(s) (LRB): TOTAL KNEE ARTHROPLASTY (Left) STEROID INJECTION (Right) ADDITIONAL DIAGNOSIS:  Acute Blood Loss Anemia Patient Active Problem List  Diagnoses  . Lumbar degenerative disc disease  . Fibromyalgia  . Joint pain  . GERD (gastroesophageal reflux disease)  . Anxiety  . Depression  . Hypertension  . Left knee DJD  . Hypokalemia  . Postoperative anemia due to acute blood loss   Plan: PT/OT WBAT, CPM  6/hrs day until ROM 0-90 degrees DVT Prophylaxis:  SCDx72hr\Lovenox DISCHARGE PLAN: Skilled Nursing Facility/Rehab DISCHARGE NEEDS: going to SNF tomorrow     Julien Girt J 01/29/2012, 9:10 AM

## 2012-01-29 NOTE — Progress Notes (Signed)
CARE MANAGEMENT NOTE 01/29/2012  Comments:  01/29/12 1417 Vance Peper, RN BSN Case Manager Per Social Worker [facility] no longer has a bed for the patient. Patient wants her to search other facilities, but she will likely go home with home health. Will follow.

## 2012-01-29 NOTE — Progress Notes (Signed)
Physical Therapy Treatment Patient Details Name: [name] MRN: [id number] DOB: 01/01/1961 Today's Date: 01/29/2012 Time: 8295-6213 PT Time Calculation (min): 50 min  PT Assessment / Plan / Recommendation Comments on Treatment Session  L Knee ROM= AROM 0-5 degrees, AAROM 0-25 degrees, PROM (seated)  0-30 degrees/     Follow Up Recommendations  Skilled nursing facility    Equipment Recommendations  Defer to next venue    Frequency 7X/week   Plan Discharge plan remains appropriate;Frequency remains appropriate    Precautions / Restrictions Precautions Precautions: Knee Precaution Booklet Issued: No Required Braces or Orthoses: Knee Immobilizer - Left Knee Immobilizer - Left: Discontinue post op day 2 Restrictions Weight Bearing Restrictions: Yes LLE Weight Bearing: Weight bearing as tolerated   Pertinent Vitals/Pain Pt reports pain after there ex = 6/10 in L knee    Mobility  Bed Mobility Bed Mobility: Sit to Supine;Supine to Sit Supine to Sit: 4: Min assist;HOB flat Supine to Sit: Patient Percentage: 80% Sit to Supine: 4: Min assist;HOB flat Details for Bed Mobility Assistance: Assist for left LE due to weakness with cues for sequence. Transfers Transfers: Sit to Stand;Stand to Sit Sit to Stand: 4: Min guard;4: Min assist;With upper extremity assist;With armrests;From chair/3-in-1 Sit to Stand: Patient Percentage: 80% Stand to Sit: 4: Min guard;With upper extremity assist;To chair/3-in-1;To bed;With armrests Details for Transfer Assistance: Verbal cues for technique including hand placement and positioning of L LE  to minimize pain.   Ambulation/Gait Ambulation/Gait Assistance: 4: Min guard Ambulation Distance (Feet): 100 Feet Assistive device: Rolling walker Ambulation/Gait Assistance Details: Cues for proper use of RW, gait sequencing and upright posture.  Gait Pattern: Step-through pattern;Decreased step length - left;Decreased stance time - left Stairs:  No Wheelchair Mobility Wheelchair Mobility: No    Exercises Total Joint Exercises Ankle Circles/Pumps: AAROM;Left;10 reps;Supine Quad Sets: Strengthening;Left;10 reps;Supine (5 second hold) Short Arc Quad: Strengthening;AAROM;5 reps;Supine (assist to extend knee pt performed isometric hold x3 sec. ) Heel Slides: Left;AAROM;5 reps;Supine (ROM severly limited ) Straight Leg Raises: Strengthening;AAROM;Left;5 reps;Supine (AAROM for SLR pt performed isometric hold x 5sec)   PT Goals Acute Rehab PT Goals PT Goal Formulation: With patient Time For Goal Achievement: 02/04/12 Potential to Achieve Goals: Good Pt will go Supine/Side to Sit: with modified independence PT Goal: Supine/Side to Sit - Progress: Progressing toward goal Pt will go Sit to Supine/Side: with modified independence PT Goal: Sit to Supine/Side - Progress: Progressing toward goal Pt will go Sit to Stand: with modified independence PT Goal: Sit to Stand - Progress: Progressing toward goal Pt will go Stand to Sit: with modified independence PT Goal: Stand to Sit - Progress: Progressing toward goal Pt will Ambulate: >150 feet;with modified independence;with least restrictive assistive device PT Goal: Ambulate - Progress: Progressing toward goal Pt will Go Up / Down Stairs: 3-5 stairs;with min assist;with least restrictive assistive device PT Goal: Up/Down Stairs - Progress: Discontinued (comment) (not applicable) Pt will Perform Home Exercise Program: Independently PT Goal: Perform Home Exercise Program - Progress: Progressing toward goal  Visit Information  Last PT Received On: 01/29/12 Assistance Needed: +1    Subjective Data  Subjective: "I will go to [facility] tomorrow." Patient Stated Goal: Decrease pain.   Cognition  Overall Cognitive Status: Appears within functional limits for tasks assessed/performed Arousal/Alertness: Awake/alert Orientation Level: Oriented X4 / Intact Behavior During Session: [facility] for tasks  performed    Balance  Balance Balance Assessed: No  End of Session PT - End of Session Equipment Utilized During  Treatment: Gait belt Activity Tolerance: Patient limited by pain Patient left: in chair;with call bell/phone within reach;with nursing in room Nurse Communication: Mobility status    Jguadalupe Opiela 01/29/2012, 10:46 PM Gurshaan Matsuoka L. Dysen Edmondson DPT [phone]

## 2012-01-29 NOTE — Progress Notes (Signed)
CARE MANAGEMENT NOTE 01/29/2012  Patient:  [name], [name]   Account Number:  [id number]  Date Initiated:  01/29/2012  Documentation initiated by:  Vance Peper  Subjective/Objective Assessment:   51 yr old female s/p left total knee arthroplasty     Action/Plan:   Spoke with patient. She plans to go to SNF- [facility] for shortterm rehab.Preoperatively setup with [facility].   Anticipated DC Date:  01/30/2012   Anticipated DC Plan:  SKILLED NURSING FACILITY  In-house referral  Clinical Social Worker      DC Planning Services  CM consult      [facility] Choice  NA   Choice offered to / List presented to:  NA   DME arranged  NA      DME agency  NA     HH arranged  NA      Status of service:  Completed, signed off  Discharge Disposition:  SKILLED NURSING FACILITY

## 2012-01-30 LAB — BASIC METABOLIC PANEL
BUN: 8 mg/dL (ref 6–23)
CO2: 23 mEq/L (ref 19–32)
Calcium: 9.3 mg/dL (ref 8.4–10.5)
Chloride: 107 mEq/L (ref 96–112)
Creatinine, Ser: 0.59 mg/dL (ref 0.50–1.10)
GFR calc Af Amer: >90 mL/min (ref >90)
GFR calc non Af Amer: >90 mL/min (ref >90)
Glucose, Bld: 111 mg/dL — ABNORMAL HIGH (ref 70–99)
Potassium: 3.7 mEq/L (ref 3.5–5.1)
Sodium: 140 mEq/L (ref 135–145)

## 2012-01-30 LAB — CBC
HCT: 27.5 % — ABNORMAL LOW (ref 36.0–46.0)
Hemoglobin: 8.7 g/dL — ABNORMAL LOW (ref 12.0–15.0)
MCH: 25.1 pg — ABNORMAL LOW (ref 26.0–34.0)
MCHC: 31.6 g/dL (ref 30.0–36.0)
MCV: 79.3 fL (ref 78.0–100.0)
Platelets: 248 10*3/uL (ref 150–400)
RBC: 3.47 MIL/uL — ABNORMAL LOW (ref 3.87–5.11)
RDW: 16 % — ABNORMAL HIGH (ref 11.5–15.5)
WBC: 11.4 10*3/uL — ABNORMAL HIGH (ref 4.0–10.5)

## 2012-01-30 MED ORDER — BISACODYL 5 MG PO TBEC: 10.0000 mg | DELAYED_RELEASE_TABLET | Freq: Every day | ORAL | Status: AC | PRN

## 2012-01-30 MED ORDER — DSS 100 MG PO CAPS: 100.0000 mg | ORAL_CAPSULE | Freq: Two times a day (BID) | ORAL | Status: AC

## 2012-01-30 MED ORDER — OXYCODONE HCL 20 MG PO TB12: 20.0000 mg | ORAL_TABLET | Freq: Two times a day (BID) | ORAL | Status: DC

## 2012-01-30 MED ORDER — CEPHALEXIN 500 MG PO CAPS: 500.0000 mg | ORAL_CAPSULE | Freq: Four times a day (QID) | ORAL | Status: AC

## 2012-01-30 MED ORDER — ENOXAPARIN SODIUM 30 MG/0.3ML ~~LOC~~ SOLN: 30.0000 mg | Freq: Two times a day (BID) | SUBCUTANEOUS | Status: DC

## 2012-01-30 MED ORDER — OXYCODONE-ACETAMINOPHEN 10-325 MG PO TABS: ORAL_TABLET | ORAL | Status: DC

## 2012-01-30 MED ORDER — DIAZEPAM 5 MG PO TABS: 5.0000 mg | ORAL_TABLET | Freq: Four times a day (QID) | ORAL | Status: AC | PRN

## 2012-01-30 NOTE — Progress Notes (Signed)
Physical Therapy Treatment Patient Details Name: [name] MRN: [id number] DOB: 08-Jun-1961 Today's Date: 01/30/2012 Time: 4098-1191 PT Time Calculation (min): 26 min  PT Assessment / Plan / Recommendation Comments on Treatment Session  Pt admitted s/p left TKA and continues to progress.  Pt able to tolerate ambulation and stair negotiation this am with increased independence.    Follow Up Recommendations  Home health PT    Equipment Recommendations  None recommended by PT    Frequency 7X/week   Plan Frequency remains appropriate;Discharge plan needs to be updated    Precautions / Restrictions Precautions Precautions: Knee Precaution Booklet Issued: No Required Braces or Orthoses: Knee Immobilizer - Left Knee Immobilizer - Left: Discontinue post op day 2 Restrictions Weight Bearing Restrictions: Yes LLE Weight Bearing: Weight bearing as tolerated    Pertinent Vitals/Pain 8/10 in left knee.  Pt premedicated and repositioned.    Mobility  Bed Mobility Bed Mobility: Not assessed Transfers Transfers: Sit to Stand;Stand to Sit Sit to Stand: 5: Supervision;With upper extremity assist;From bed Stand to Sit: 5: Supervision;With upper extremity assist;To chair/3-in-1 Details for Transfer Assistance: Verbal cues for safest technique including hand/left LE placement. Ambulation/Gait Ambulation/Gait Assistance: 5: Supervision Ambulation Distance (Feet): 120 Feet Assistive device: Rolling walker Ambulation/Gait Assistance Details: Cues for tall posture and safety inside RW including sequencing of step-through with initial contact on left heel. Gait Pattern: Step-through pattern;Decreased step length - left;Decreased stance time - left Stairs: Yes Stairs Assistance: 4: Min guard Stair Management Technique: Two rails;Step to pattern;Forwards Number of Stairs: 2  Wheelchair Mobility Wheelchair Mobility: No    Exercises Total Joint Exercises Knee Flexion: AAROM;Left;Seated  (1 repetition to measure AA/ROM 0-45 degrees this am.)   PT Goals Acute Rehab PT Goals PT Goal Formulation: With patient Time For Goal Achievement: 02/04/12 Potential to Achieve Goals: Good PT Goal: Sit to Stand - Progress: Goal set today PT Goal: Stand to Sit - Progress: Goal set today PT Goal: Ambulate - Progress: Goal set today Pt will Go Up / Down Stairs: 3-5 stairs;with min assist;with least restrictive assistive device PT Goal: Up/Down Stairs - Progress: Met PT Goal: Perform Home Exercise Program - Progress: Progressing toward goal  Visit Information  Last PT Received On: 01/30/12 Assistance Needed: +1    Subjective Data  Subjective: "I am fine to go home today." Patient Stated Goal: Decrease pain.   Cognition  Overall Cognitive Status: Appears within functional limits for tasks assessed/performed Arousal/Alertness: Awake/alert Orientation Level: Oriented X4 / Intact Behavior During Session: [facility] for tasks performed    Balance  Balance Balance Assessed: No  End of Session PT - End of Session Equipment Utilized During Treatment: Gait belt Activity Tolerance: Patient tolerated treatment well Patient left: with call bell/phone within reach (On 3-N-1 in bathroom.) Nurse Communication: Mobility status    Cephus Shelling 01/30/2012, 10:04 AM  01/30/2012 Cephus Shelling, PT, DPT [phone]

## 2012-01-30 NOTE — Discharge Summary (Signed)
. Patient ID: [name] MRN: [id number] DOB/AGE: 07-Sep-1961 51 y.o.  Admit date: 01/27/2012 Discharge date: 01/30/2012  Admission Diagnoses:  Principal Problem:  *Left knee DJD Active Problems:  Lumbar degenerative disc disease  Fibromyalgia  Joint pain  GERD (gastroesophageal reflux disease)  Anxiety  Depression  Hypertension  Hypokalemia  Postoperative anemia due to acute blood loss   Discharge Diagnoses:  Same  Past Medical History  Diagnosis Date  . Bronchitis   . Jones fracture     left foot fifth metatarsal  . Fibromyalgia   . Varicose vein     protrudes above skin-per pt;vein popped and bruised;ultrasound done to make sure that there were no clots;noclots were found  . Chronic back pain   . Multiple allergies   . Asthma     but pt states not severe enough to even have an inhaler  . Shortness of breath     with exertion;pt states that its related to her weight  . Hyperthyroidism     but pt doesn't take anything for it;thyroid nodules  . Headache   . Dizziness     rarely  . Impaired memory   . Joint pain   . Joint swelling   . GERD (gastroesophageal reflux disease)     doesn't take anything for it  . IBS (irritable bowel syndrome)   . Arthritis     bilateral knees  . Bruising     pt states unexplained d/t fibromyalgia  . Urinary frequency   . Increased thirst   . Anemia   . Anxiety     takes Ativan and Valium  . Depression     from Fibromyalgia diagnosis  . Insomnia     takes Ambien  . Sore gums     this is why pt is on Amoxil-only takes for dental work  . Hypertension     recently diagnosed (this month)  . Left knee DJD     Surgeries: Procedure(s): TOTAL KNEE ARTHROPLASTY STEROID INJECTION on 01/27/2012   Consultants:  none  Discharged Condition: Improved  Hospital Course: [name] is an 51 y.o. female who was admitted 01/27/2012 for operative treatment ofLeft knee DJD. Patient has severe unremitting pain that affects  sleep, daily activities, and work/hobbies. After pre-op clearance the patient was taken to the operating room on 01/27/2012 and underwent  Procedure(s): TOTAL KNEE ARTHROPLASTY STEROID INJECTION.    Patient was given perioperative antibiotics: Anti-infectives     Start     Dose/Rate Route Frequency Ordered Stop   01/30/12 0000   cephALEXin (KEFLEX) 500 MG capsule        500 mg Oral 4 times daily 01/30/12 0837 02/09/12 2359   01/27/12 1400   ceFAZolin (ANCEF) IVPB 2 g/50 mL premix        2 g 100 mL/hr over 30 Minutes Intravenous Every 6 hours 01/27/12 1157 01/28/12 0241   01/27/12 0809   cefUROXime (ZINACEF) injection  Status:  Discontinued          As needed 01/27/12 0809 01/27/12 0939   01/26/12 1444   ceFAZolin (ANCEF) IVPB 2 g/50 mL premix        2 g 100 mL/hr over 30 Minutes Intravenous 60 min pre-op 01/26/12 1444 01/27/12 0730           Patient was given sequential compression devices, early ambulation, and chemoprophylaxis to prevent DVT.  Patient benefited maximally from hospital stay and there were no complications.    Recent vital signs:  Patient Vitals for the past 24 hrs:  BP Temp Temp src Pulse Resp SpO2  01/30/12 0808 139/92 mmHg 97.1 F (36.2 C) Oral 114  18  96 %  01/30/12 0523 113/68 mmHg 97.9 F (36.6 C) Oral 113  16  91 %  01/29/12 2101 - - - 128  - -  01/29/12 2054 169/98 mmHg 99.3 F (37.4 C) Oral 128  20  98 %  01/29/12 1300 172/98 mmHg 97.6 F (36.4 C) - 112  18  -     Recent laboratory studies:   Basename 01/30/12 0530 01/29/12 0600  WBC 11.4* 11.9*  HGB 8.7* 9.1*  HCT 27.5* 28.7*  PLT 248 242  NA 140 141  K 3.7 3.2*  CL 107 107  CO2 23 25  BUN 8 6  CREATININE 0.59 0.64  GLUCOSE 111* 130*  INR -- --  CALCIUM 9.3 --     Discharge Medications:   Medication List  As of 01/30/2012  8:46 AM   STOP taking these medications         HYDROcodone-acetaminophen 7.5-500 MG per tablet      OVER THE COUNTER MEDICATION         TAKE these  medications         ATIVAN 1 MG tablet   Generic drug: LORazepam   Take 1 mg by mouth as needed. For anxiety attacks      BENADRYL PO   Take 2 capsules by mouth 1 day or 1 dose. In case she comes in contact with shellfish or shrimp      bisacodyl 5 MG EC tablet   Commonly known as: DULCOLAX   Take 2 tablets (10 mg total) by mouth daily as needed for constipation.      calcium-vitamin D 500-200 MG-UNIT per tablet   Commonly known as: OSCAL WITH D   Take 1 tablet by mouth daily.      celecoxib 200 MG capsule   Commonly known as: CELEBREX   Take 200 mg by mouth daily.      cephALEXin 500 MG capsule   Commonly known as: KEFLEX   Take 1 capsule (500 mg total) by mouth 4 (four) times daily.      cyclobenzaprine 10 MG tablet   Commonly known as: FLEXERIL   Take 10 mg by mouth 3 (three) times daily.      diazepam 5 MG tablet   Commonly known as: VALIUM   Take 1 tablet (5 mg total) by mouth every 6 (six) hours as needed for anxiety.      diclofenac sodium 1 % Gel   Commonly known as: VOLTAREN   Apply 1 application topically 2 (two) times daily as needed. For right shoulder and arm and legs      DSS 100 MG Caps   Take 100 mg by mouth 2 (two) times daily.      enoxaparin 30 MG/0.3ML injection   Commonly known as: LOVENOX   Inject 0.3 mLs (30 mg total) into the skin every 12 (twelve) hours.      gabapentin 300 MG capsule   Commonly known as: NEURONTIN   Take 300 mg by mouth 3 (three) times daily.      hydrochlorothiazide 25 MG tablet   Commonly known as: HYDRODIURIL   Take 25 mg by mouth daily. One tablet by mouth in the morning.      oxyCODONE 20 MG 12 hr tablet   Commonly known as: OXYCONTIN   Take 1  tablet (20 mg total) by mouth every 12 (twelve) hours.      oxyCODONE-acetaminophen 10-325 MG per tablet   Commonly known as: PERCOCET   1-2 tab every 4-6 hrs as needed for pain      zolpidem 10 MG tablet   Commonly known as: AMBIEN   Take 10 mg by mouth at bedtime as  needed. For sleep            Diagnostic Studies: No results found.  Disposition: 01-Home or Self Care  Discharge Orders    Future Orders Please Complete By Expires   Diet - low sodium heart healthy      Call MD / Call 911      Comments:   If you experience chest pain or shortness of breath, CALL 911 and be transported to the hospital emergency room.  If you develope a fever above 101 F, pus (white drainage) or increased drainage or redness at the wound, or calf pain, call your surgeon's office.   Constipation Prevention      Comments:   Drink plenty of fluids.  Prune juice may be helpful.  You may use a stool softener, such as Colace (over the counter) 100 mg twice a day.  Use MiraLax (over the counter) for constipation as needed.   Increase activity slowly as tolerated      Weight Bearing as taught in Physical Therapy      Comments:   Use a walker or crutches as instructed.   Discharge instructions      Comments:   Total Knee Replacement Care After Refer to this sheet in the next few weeks. These discharge instructions provide you with general information on caring for yourself after you leave the hospital. Your caregiver may also give you specific instructions. Your treatment has been planned according to the most current medical practices available, but unavoidable complications sometimes occur. If you have any problems or questions after discharge, please call your caregiver. Regaining a near full range of motion of your knee within the first 3 to 6 weeks after surgery is critical. HOME CARE INSTRUCTIONS  You may resume a normal diet and activities as directed.  Perform exercises as directed.  Place yellow foam block, yellow side up under heel at all times except when in CPM or when walking.  DO NOT modify, tear, cut, or change in any way. You will receive physical therapy daily  Take showers instead of baths until informed otherwise.  Change bandages (dressings)daily Do not  take over-the-counter or prescription medicines for pain, discomfort, or fever. Eat a well-balanced diet.  Avoid lifting or driving until you are instructed otherwise.  Make an appointment to see your caregiver for stitches (suture) or staple removal as directed.  If you have been sent home with a continuous passive motion machine (CPM machine), 0-90 degrees 6 hrs a day   2 hrs a shift SEEK MEDICAL CARE IF: You have swelling of your calf or leg.  You develop shortness of breath or chest pain.  You have redness, swelling, or increasing pain in the wound.  There is pus or any unusual drainage coming from the surgical site.  You notice a bad smell coming from the surgical site or dressing.  The surgical site breaks open after sutures or staples have been removed.  There is persistent bleeding from the suture or staple line.  You are getting worse or are not improving.  You have any other questions or concerns.  SEEK IMMEDIATE  MEDICAL CARE IF:  You have a fever.  ny way the yellow foam block.You develop a rash.  You have difficulty breathing.  You develop any reaction or side effects to medicines given.  Your knee motion is decreasing rather than improving.  MAKE SURE YOU:  Understand these instructions.  Will watch your condition.  Will get help right away if you are not doing well or get worse.     CPM      Comments:   Continuous passive motion machine (CPM):      Use the CPM from 0 to 90 for 6 hours per day.       You may break it up into 2 or 3 sessions per day.      Use CPM for 2 weeks or until you are told to stop.   TED hose      Comments:   Use stockings (TED hose) for 2 weeks on both leg(s).  You may remove them at night for sleeping.   Change dressing      Comments:   Change the dressing daily with sterile 4 x 4 inch gauze dressing and apply TED hose.  You may clean the incision with alcohol prior to redressing.   Do not put a pillow under the knee. Place it under the  heel.      Comments:   Place yellow foam block, yellow side up under heel at all times except when in CPM or when walking.  DO NOT modify, tear, cut, or change in any way the yellow foam block.      Follow-up Information    Follow up with Nilda Simmer, MD on 02/10/2012. (appt time 2:15)    Contact information:   [facility] [address] [place] [address] [phone]           Signed: Pascal Lux 01/30/2012, 8:46 AM

## 2012-01-30 NOTE — Progress Notes (Signed)
Pt is ready for discharge today. Pt will discharge home with home health services arranged by [facility]. Pt is agreeable to agreeable to discharge plan. CSW is signing off as no further clinical social work needs identified. Please call if an urgent need arises prior to discharge.   Dede Query, MSW, Theresia Majors [phone]

## 2012-01-30 NOTE — Progress Notes (Signed)
Occupational Therapy Treatment Patient Details Name: [name] MRN: [id number] DOB: 1961/01/10 Today's Date: 01/30/2012 Time: 1101-1110 OT Time Calculation (min): 9 min  OT Assessment / Plan / Recommendation Comments on Treatment Session  Pt. Progressing well and anticipates D/C home today.    Follow Up Recommendations  Home health OT;Supervision - Intermittent    Equipment Recommendations  None recommended by OT    Frequency Min 2X/week   Plan Discharge plan needs to be updated    Precautions / Restrictions Precautions Precautions: Knee Precaution Booklet Issued: No Required Braces or Orthoses: Knee Immobilizer - Left Knee Immobilizer - Left: Discontinue post op day 2 Restrictions Weight Bearing Restrictions: Yes LLE Weight Bearing: Weight bearing as tolerated   Pertinent Vitals/Pain None    ADL  Grooming: Performed;Wash/dry face;Set up;Supervision/safety Where Assessed - Grooming: Standing at sink Lower Body Dressing: Simulated;Minimal assistance Where Assessed - Lower Body Dressing: Sit to stand from chair Ambulation Related to ADLs: Min guard assist for safety with RW ADL Comments: Pt. educated on use of AE for LB ADLs and able to recall technique for completing tub transfer with use of tub transfer bench. Pt. anticipates D/C home today with family assist PRN.    OT Goals Acute Rehab OT Goals OT Goal Formulation: With patient Time For Goal Achievement: 02/04/12 Potential to Achieve Goals: Good ADL Goals Pt Will Perform Lower Body Bathing: with supervision;Sitting, edge of bed;Sit to stand from bed;with cueing (comment type and amount);with adaptive equipment;Unsupported ADL Goal: Lower Body Bathing - Progress: Progressing toward goals Pt Will Perform Lower Body Dressing: with supervision;Unsupported;with adaptive equipment;Sit to stand from bed ADL Goal: Lower Body Dressing - Progress: Progressing toward goals Pt Will Transfer to Toilet: with modified  independence;Ambulation;with DME;3-in-1 ADL Goal: Toilet Transfer - Progress: Progressing toward goals  Visit Information  Last OT Received On: 01/30/12 Assistance Needed: +1          Cognition  Overall Cognitive Status: Appears within functional limits for tasks assessed/performed Arousal/Alertness: Awake/alert Orientation Level: Oriented X4 / Intact Behavior During Session: [facility] for tasks performed    Mobility Bed Mobility Bed Mobility: Not assessed Transfers Sit to Stand: 5: Supervision;With upper extremity assist;From bed Stand to Sit: 5: Supervision;With upper extremity assist;To chair/3-in-1 Details for Transfer Assistance: Min verbal cues for hand placement for technique and safety      Balance Balance Balance Assessed: No  End of Session OT - End of Session Equipment Utilized During Treatment: Gait belt Activity Tolerance: Patient tolerated treatment well Patient left: in bed;with call bell/phone within reach Nurse Communication: Mobility status CPM Left Knee CPM Left Knee: Off   Lindel Marcell, OTR/L Pager [phone] 01/30/2012, 11:26 AM

## 2012-03-23 ENCOUNTER — Encounter (HOSPITAL_COMMUNITY): Payer: Self-pay | Admitting: Respiratory Therapy

## 2012-03-24 ENCOUNTER — Encounter: Payer: Self-pay | Admitting: Physician Assistant

## 2012-03-24 ENCOUNTER — Other Ambulatory Visit: Payer: Self-pay | Admitting: Physician Assistant

## 2012-03-24 DIAGNOSIS — Z96659 Presence of unspecified artificial knee joint: Secondary | ICD-10-CM

## 2012-03-24 DIAGNOSIS — M1711 Unilateral primary osteoarthritis, right knee: Secondary | ICD-10-CM

## 2012-03-24 NOTE — H&P (Signed)
[name] is an 51 y.o. female.   Chief Complaint: right knee DJD HPI: [name] comes in today for evaluation of her right knee.  She is a 51 year-old African American female with a history of bilateral knee DJD.  She has failed conservative care, including anti-inflammatories and intraarticular Cortisone injections, as well as intraarticular Supartz injections.  She requires assistance rising from a chair and due to the excruciating pain is planning on a left total knee replacement.  She is disabled from chronic back pain and has recently undergone a lumbar fusion.  She had a left total knee on 01/27/2012.  Now ready for her right.  Past Medical History  Diagnosis Date  . Bronchitis   . Jones fracture     left foot fifth metatarsal  . Fibromyalgia   . Varicose vein     protrudes above skin-per pt;vein popped and bruised;ultrasound done to make sure that there were no clots;noclots were found  . Chronic back pain   . Multiple allergies   . Asthma     but pt states not severe enough to even have an inhaler  . Shortness of breath     with exertion;pt states that its related to her weight  . Hyperthyroidism     but pt doesn't take anything for it;thyroid nodules  . Headache   . Dizziness     rarely  . Impaired memory   . Joint pain   . Joint swelling   . GERD (gastroesophageal reflux disease)     doesn't take anything for it  . IBS (irritable bowel syndrome)   . Arthritis     bilateral knees  . Bruising     pt states unexplained d/t fibromyalgia  . Urinary frequency   . Increased thirst   . Anemia   . Anxiety     takes Ativan and Valium  . Depression     from Fibromyalgia diagnosis  . Insomnia     takes Ambien  . Sore gums     this is why pt is on Amoxil-only takes for dental work  . Hypertension     recently diagnosed (this month)  . Left knee DJD     Past Surgical History  Procedure Date  . Cesarean section 1984  . Spine surgery 2004    Cervical plate, ACDF  .  Knee surgery 2005    Left knee arthroscopy  . Nasal septoplasty w/ turbinoplasty 2007  . Tonsillectomy 2007  . Fracture surgery 05/08/2010    Jones fracture left foot fifth metatarsal  . Anterior lumbar fusion 09/20/2011    Procedure: ANTERIOR LUMBAR FUSION 1 LEVEL;  Surgeon: Kyle L Cabbell;  Location: [facility];  Service: Neurosurgery;  Laterality: N/A;  Lumbar five-Sacral One Anterior Lumbar Interbody Fusion /Dr. Early to Approach   . Cervical disc surgery     WITH TITANIUM PLATE IN NECK---LEFT SIDE  . Abdominal hysterectomy     partial hysterectomy -mid 2000's,   . Total knee arthroplasty 01/27/2012    Procedure: TOTAL KNEE ARTHROPLASTY;  Surgeon: Robert A Wainer, MD;  Location: [facility] OR;  Service: Orthopedics;  Laterality: Left;  DR WAINER WANTS 90 MINUTES FOR THIS CASE  . Steriod injection 01/27/2012    Procedure: STEROID INJECTION;  Surgeon: Robert A Wainer, MD;  Location: [facility] OR;  Service: Orthopedics;  Laterality: Right;  . Joint replacement 01-27-2012    left total knee    Family History  Problem Relation Age of Onset  .   Stroke Father   . Anesthesia problems Neg Hx   . Hypotension Neg Hx   . Malignant hyperthermia Neg Hx   . Pseudochol deficiency Neg Hx   . Arthritis Sister    Social History:  reports that she quit smoking about 13 years ago. She has never used smokeless tobacco. She reports that she does not drink alcohol or use illicit drugs.  Allergies:  Allergies  Allergen Reactions  . Shellfish Allergy Anaphylaxis  . Latex Other (See Comments)    Itching/rash  . Other Other (See Comments)    MSG-   Current Outpatient Prescriptions on File Prior to Visit  Medication Sig Dispense Refill  . calcium-vitamin D (OSCAL WITH D) 500-200 MG-UNIT per tablet Take 1 tablet by mouth daily.      . cyclobenzaprine (FLEXERIL) 10 MG tablet Take 10 mg by mouth 3 (three) times daily.        . diclofenac sodium (VOLTAREN) 1 % GEL Apply 1 application topically 2 (two) times daily as  needed. For right shoulder and arm and legs      . DiphenhydrAMINE HCl (BENADRYL PO) Take 2 capsules by mouth 1 day or 1 dose. In case she comes in contact with shellfish or shrimp      . Docusate Calcium (STOOL SOFTENER PO) Take 1 tablet by mouth daily.      . gabapentin (NEURONTIN) 300 MG capsule Take 300 mg by mouth 3 (three) times daily.        . hydrochlorothiazide (HYDRODIURIL) 25 MG tablet Take 25 mg by mouth daily. One tablet by mouth in the morning.       . LORazepam (ATIVAN) 1 MG tablet Take 1 mg by mouth 2 (two) times daily as needed. For anxiety attacks      . meloxicam (MOBIC) 15 MG tablet Take 15 mg by mouth daily.      . oxyCODONE-acetaminophen (PERCOCET) 5-325 MG per tablet Take 1 tablet by mouth every 4 (four) hours as needed.      . zolpidem (AMBIEN) 10 MG tablet Take 10 mg by mouth at bedtime as needed. For sleep         (Not in a hospital admission)  No results found for this or any previous visit (from the past 48 hour(s)). No results found.  Review of Systems  Constitutional: Negative.   HENT: Negative.   Eyes: Negative.   Respiratory: Negative.   Cardiovascular: Negative.   Gastrointestinal: Negative.   Genitourinary: Negative.   Musculoskeletal: Positive for joint pain.  Skin: Negative.   Neurological: Negative.   Endo/Heme/Allergies: Negative.   Psychiatric/Behavioral: Negative.     Blood pressure 123/83, pulse 112, temperature 98.4 F (36.9 C), height 5' 2" (1.575 m), weight 106.142 kg (234 lb), SpO2 97.00%. Physical Exam  Constitutional: She is oriented to person, place, and time. She appears well-developed and well-nourished.  HENT:  Head: Normocephalic and atraumatic.  Mouth/Throat: Oropharynx is clear and moist.  Eyes: EOM are normal. Pupils are equal, round, and reactive to light.  Neck: Neck supple.  Cardiovascular: Regular rhythm.        tachycardia  Respiratory: Breath sounds normal.  GI: Soft. Bowel sounds are normal.  Genitourinary:        Not pertinent to current symptomatology therefore not examined.  Musculoskeletal:       Left knee has active range of motion 0-120 degrees.  Surgical wound is well approximated and healed.  4-/5 strength moderately antalgic gait. Right knee active range of motion   0-120 degrees 2+ crepitus 1+ synovitis medial joint line tenderness normal patella tracking mild varus deformity. She is neurovascularly intact.    Neurological: She is alert and oriented to person, place, and time.  Skin: Skin is warm and dry.  Psychiatric: She has a normal mood and affect. Her behavior is normal. Judgment and thought content normal.     Assessment Patient Active Problem List  Diagnosis  . Lumbar degenerative disc disease  . Fibromyalgia  . Joint pain  . GERD (gastroesophageal reflux disease)  . Anxiety  . Depression  . Hypertension  . Left knee DJD  . Hypokalemia  . Postoperative anemia due to acute blood loss  . Right knee DJD  . Status post left total prosthetic replacement of knee joint using cement   Plan Right total knee replacement.  The risks, benefits, and possible complications of the procedure were discussed in detail with the patient.  The patient is without question.  Shishir Krantz J 03/24/2012, 3:16 PM    

## 2012-03-31 ENCOUNTER — Encounter (HOSPITAL_COMMUNITY): Payer: Self-pay

## 2012-03-31 ENCOUNTER — Encounter (HOSPITAL_COMMUNITY)
Admission: RE | Admit: 2012-03-31 | Discharge: 2012-03-31 | Disposition: A | Discharge status: Home / Self Care | Payer: Medicare Other | Source: Ambulatory Visit | Attending: Orthopedic Surgery | Admitting: Orthopedic Surgery

## 2012-03-31 ENCOUNTER — Encounter (HOSPITAL_COMMUNITY)
Admission: RE | Admit: 2012-03-31 | Discharge: 2012-03-31 | Disposition: A | Discharge status: Home / Self Care | Payer: Medicare Other | Source: Ambulatory Visit | Attending: Physician Assistant | Admitting: Physician Assistant

## 2012-03-31 HISTORY — DX: Tachycardia, unspecified: R00.0

## 2012-03-31 LAB — COMPREHENSIVE METABOLIC PANEL
ALT: 29 U/L (ref 0–35)
AST: 20 U/L (ref 0–37)
Albumin: 4 g/dL (ref 3.5–5.2)
Alkaline Phosphatase: 138 U/L — ABNORMAL HIGH (ref 39–117)
BUN: 16 mg/dL (ref 6–23)
CO2: 22 mEq/L (ref 19–32)
Calcium: 9.9 mg/dL (ref 8.4–10.5)
Chloride: 98 mEq/L (ref 96–112)
Creatinine, Ser: 0.83 mg/dL (ref 0.50–1.10)
GFR calc Af Amer: >90 mL/min (ref >90)
GFR calc non Af Amer: 81 mL/min — ABNORMAL LOW (ref >90)
Glucose, Bld: 98 mg/dL (ref 70–99)
Potassium: 3 mEq/L — ABNORMAL LOW (ref 3.5–5.1)
Sodium: 135 mEq/L (ref 135–145)
Total Bilirubin: 0.1 mg/dL — ABNORMAL LOW (ref 0.3–1.2)
Total Protein: 7.7 g/dL (ref 6.0–8.3)

## 2012-03-31 LAB — DIFFERENTIAL
Basophils Absolute: 0 10*3/uL (ref 0.0–0.1)
Basophils Relative: 0 % (ref 0–1)
Eosinophils Absolute: 0.3 10*3/uL (ref 0.0–0.7)
Eosinophils Relative: 2 % (ref 0–5)
Lymphocytes Relative: 32 % (ref 12–46)
Lymphs Abs: 5.4 10*3/uL — ABNORMAL HIGH (ref 0.7–4.0)
Monocytes Absolute: 1 10*3/uL (ref 0.1–1.0)
Monocytes Relative: 6 % (ref 3–12)
Neutro Abs: 10.1 10*3/uL — ABNORMAL HIGH (ref 1.7–7.7)
Neutrophils Relative %: 60 % (ref 43–77)

## 2012-03-31 LAB — TYPE AND SCREEN
ABO/RH(D): B POS
Antibody Screen: NEGATIVE

## 2012-03-31 LAB — CBC
HCT: 40.1 % (ref 36.0–46.0)
Hemoglobin: 12.7 g/dL (ref 12.0–15.0)
MCH: 24.2 pg — ABNORMAL LOW (ref 26.0–34.0)
MCHC: 31.7 g/dL (ref 30.0–36.0)
MCV: 76.4 fL — ABNORMAL LOW (ref 78.0–100.0)
Platelets: 339 10*3/uL (ref 150–400)
RBC: 5.25 MIL/uL — ABNORMAL HIGH (ref 3.87–5.11)
RDW: 16.6 % — ABNORMAL HIGH (ref 11.5–15.5)
WBC: 16.8 10*3/uL — ABNORMAL HIGH (ref 4.0–10.5)

## 2012-03-31 LAB — URINE MICROSCOPIC-ADD ON

## 2012-03-31 LAB — URINALYSIS, ROUTINE W REFLEX MICROSCOPIC
Bilirubin Urine: NEGATIVE
Glucose, UA: NEGATIVE mg/dL
Hgb urine dipstick: NEGATIVE
Ketones, ur: NEGATIVE mg/dL
Nitrite: NEGATIVE
Protein, ur: NEGATIVE mg/dL
Specific Gravity, Urine: 1.016 (ref 1.005–1.030)
Urobilinogen, UA: 0.2 mg/dL (ref 0.0–1.0)
pH: 6 (ref 5.0–8.0)

## 2012-03-31 LAB — PROTIME-INR
INR: 0.91 (ref 0.00–1.49)
Prothrombin Time: 12.5 seconds (ref 11.6–15.2)

## 2012-03-31 LAB — SURGICAL PCR SCREEN
MRSA, PCR: NEGATIVE
Staphylococcus aureus: NEGATIVE

## 2012-03-31 LAB — APTT: aPTT: 33 seconds (ref 24–37)

## 2012-03-31 NOTE — Progress Notes (Signed)
Primary Physician- Dr. Clide Deutscher, Dr. Thurmond Butts Does not have a cardiologist. Echo, stress test never had.  EKG in epic, MD wants a new EKG

## 2012-03-31 NOTE — Pre-Procedure Instructions (Signed)
20 SALAYA HOLTROP  03/31/2012   Your procedure is scheduled on:  July 1st  Report to [facility] at 0530 AM.  Call this number if you have problems the morning of surgery: [phone]   Remember:   Do not eat food or drink:After Midnight.  Take these medicines the morning of surgery with A SIP OF WATER: Neurontin, ativan (if needed), percocet (if needed)   Do not wear jewelry, make-up or nail polish.  Do not wear lotions, powders, or perfumes.   Do not shave 48 hours prior to surgery. Men may shave face and neck.  Do not bring valuables to the hospital.  Contacts, dentures or bridgework may not be worn into surgery.  Leave suitcase in the car. After surgery it may be brought to your room.  For patients admitted to the hospital, checkout time is 11:00 AM the day of discharge.   Patients discharged the day of surgery will not be allowed to drive home.  Special Instructions: CHG Shower Use Special Wash: 1/2 bottle night before surgery and 1/2 bottle morning of surgery.   Please read over the following fact sheets that you were given: Pain Booklet, Coughing and Deep Breathing, Blood Transfusion Information, Total Joint Packet, MRSA Information and Surgical Site Infection Prevention

## 2012-04-01 NOTE — Consult Note (Addendum)
Anesthesia Chart Review:  Patient is a 51 year old female scheduled for a right TKR on 04/06/12 by Dr. Thurston Hole.  History includes left TKR 01/27/12, lumbar fusion 09/19/11, prior hysterectomy, nasal, and c-spine (fusion) surgeries, former smoker, obesity with BMI 42.7, fibromyalgia, asthma/bronchitis, hyperthyroidism, impaired memory, GERD, IBS, anxiety, depression, insomnia, HTN.    CXR on 03/31/12 showed no acute cardiopulmonary disease.  EKG on 03/31/12 showed NSR, minimal voltage criteria for LVH.  Labs noted.  K 3.0, Cr 0.83, WBC 16.8, H/H 12.7/401.  Coags WNL.  Her UA showed small leukocytes, negative nitrites, rare bacteria.  Her urine culture was under "to be collected" status, but specimen was still in the lab.  An order for urine C&S was re-entered and lab will add it on.  Julien Girt, PA-C will review with Dr. Thurston Hole and follow-up urine culture results.  Shonna Chock, PA-C 04/01/12 1227  Addendum: 04/01/12 1600  Received a message from Kirstin.  She is starting patient on Cipro for urinary symptoms and KCL supplement for hypokalemia.  She would like a BMET repeated on the day of surgery.  Will also repeat her CBC to ensure stability.  They will consider Hematology consult post-operatively based on her CBC with differential results.

## 2012-04-03 LAB — URINE CULTURE
Colony Count: NO GROWTH
Culture  Setup Time: 201306261235
Culture: NO GROWTH

## 2012-04-05 MED ORDER — CEFAZOLIN SODIUM-DEXTROSE 2-3 GM-% IV SOLR
2.0000 g | INTRAVENOUS | Status: AC
  Administered 2012-04-06: 2 g via INTRAVENOUS
  Filled 2012-04-05: qty 50

## 2012-04-06 ENCOUNTER — Ambulatory Visit (HOSPITAL_COMMUNITY): Payer: Medicare Other | Admitting: Vascular Surgery

## 2012-04-06 ENCOUNTER — Inpatient Hospital Stay (HOSPITAL_COMMUNITY)
Admission: RE | Admit: 2012-04-06 | Discharge: 2012-04-08 | DRG: 470 | Disposition: A | Discharge status: Home Health | Payer: Medicare Other | Source: Ambulatory Visit | Attending: Orthopedic Surgery | Admitting: Orthopedic Surgery

## 2012-04-06 ENCOUNTER — Encounter (HOSPITAL_COMMUNITY): Payer: Self-pay | Admitting: Vascular Surgery

## 2012-04-06 ENCOUNTER — Encounter (HOSPITAL_COMMUNITY)
Admission: RE | Disposition: A | Discharge status: Home Health | Payer: Self-pay | Source: Ambulatory Visit | Attending: Orthopedic Surgery

## 2012-04-06 DIAGNOSIS — F3289 Other specified depressive episodes: Secondary | ICD-10-CM | POA: Diagnosis present

## 2012-04-06 DIAGNOSIS — T8132XA Disruption of internal operation (surgical) wound, not elsewhere classified, initial encounter: Secondary | ICD-10-CM | POA: Diagnosis not present

## 2012-04-06 DIAGNOSIS — G4700 Insomnia, unspecified: Secondary | ICD-10-CM | POA: Diagnosis present

## 2012-04-06 DIAGNOSIS — W1809XA Striking against other object with subsequent fall, initial encounter: Secondary | ICD-10-CM | POA: Diagnosis not present

## 2012-04-06 DIAGNOSIS — Y92009 Unspecified place in unspecified non-institutional (private) residence as the place of occurrence of the external cause: Secondary | ICD-10-CM | POA: Diagnosis not present

## 2012-04-06 DIAGNOSIS — M51369 Other intervertebral disc degeneration, lumbar region without mention of lumbar back pain or lower extremity pain: Secondary | ICD-10-CM | POA: Diagnosis present

## 2012-04-06 DIAGNOSIS — K219 Gastro-esophageal reflux disease without esophagitis: Secondary | ICD-10-CM | POA: Diagnosis present

## 2012-04-06 DIAGNOSIS — F419 Anxiety disorder, unspecified: Secondary | ICD-10-CM | POA: Diagnosis present

## 2012-04-06 DIAGNOSIS — I10 Essential (primary) hypertension: Secondary | ICD-10-CM | POA: Diagnosis present

## 2012-04-06 DIAGNOSIS — M5136 Other intervertebral disc degeneration, lumbar region: Secondary | ICD-10-CM | POA: Diagnosis present

## 2012-04-06 DIAGNOSIS — F411 Generalized anxiety disorder: Secondary | ICD-10-CM | POA: Diagnosis present

## 2012-04-06 DIAGNOSIS — IMO0001 Reserved for inherently not codable concepts without codable children: Secondary | ICD-10-CM | POA: Diagnosis present

## 2012-04-06 DIAGNOSIS — Z96659 Presence of unspecified artificial knee joint: Secondary | ICD-10-CM

## 2012-04-06 DIAGNOSIS — Z79899 Other long term (current) drug therapy: Secondary | ICD-10-CM

## 2012-04-06 DIAGNOSIS — Z91013 Allergy to seafood: Secondary | ICD-10-CM

## 2012-04-06 DIAGNOSIS — E876 Hypokalemia: Secondary | ICD-10-CM | POA: Diagnosis not present

## 2012-04-06 DIAGNOSIS — Z8261 Family history of arthritis: Secondary | ICD-10-CM

## 2012-04-06 DIAGNOSIS — Z01812 Encounter for preprocedural laboratory examination: Secondary | ICD-10-CM

## 2012-04-06 DIAGNOSIS — M2550 Pain in unspecified joint: Secondary | ICD-10-CM | POA: Diagnosis present

## 2012-04-06 DIAGNOSIS — F329 Major depressive disorder, single episode, unspecified: Secondary | ICD-10-CM | POA: Diagnosis present

## 2012-04-06 DIAGNOSIS — D62 Acute posthemorrhagic anemia: Secondary | ICD-10-CM | POA: Diagnosis present

## 2012-04-06 DIAGNOSIS — M797 Fibromyalgia: Secondary | ICD-10-CM | POA: Diagnosis present

## 2012-04-06 DIAGNOSIS — Z981 Arthrodesis status: Secondary | ICD-10-CM

## 2012-04-06 DIAGNOSIS — T81329A Deep disruption or dehiscence of operation wound, unspecified, initial encounter: Secondary | ICD-10-CM | POA: Diagnosis not present

## 2012-04-06 DIAGNOSIS — S86909A Unspecified injury of unspecified muscle(s) and tendon(s) at lower leg level, unspecified leg, initial encounter: Secondary | ICD-10-CM | POA: Diagnosis not present

## 2012-04-06 DIAGNOSIS — Y831 Surgical operation with implant of artificial internal device as the cause of abnormal reaction of the patient, or of later complication, without mention of misadventure at the time of the procedure: Secondary | ICD-10-CM | POA: Diagnosis not present

## 2012-04-06 DIAGNOSIS — G8929 Other chronic pain: Secondary | ICD-10-CM | POA: Diagnosis present

## 2012-04-06 DIAGNOSIS — Z87891 Personal history of nicotine dependence: Secondary | ICD-10-CM

## 2012-04-06 DIAGNOSIS — F32A Depression, unspecified: Secondary | ICD-10-CM | POA: Diagnosis present

## 2012-04-06 DIAGNOSIS — M1710 Unilateral primary osteoarthritis, unspecified knee: Principal | ICD-10-CM | POA: Diagnosis present

## 2012-04-06 DIAGNOSIS — D72829 Elevated white blood cell count, unspecified: Secondary | ICD-10-CM | POA: Diagnosis present

## 2012-04-06 DIAGNOSIS — Z7901 Long term (current) use of anticoagulants: Secondary | ICD-10-CM

## 2012-04-06 DIAGNOSIS — M51379 Other intervertebral disc degeneration, lumbosacral region without mention of lumbar back pain or lower extremity pain: Secondary | ICD-10-CM | POA: Diagnosis present

## 2012-04-06 DIAGNOSIS — K589 Irritable bowel syndrome without diarrhea: Secondary | ICD-10-CM | POA: Diagnosis present

## 2012-04-06 DIAGNOSIS — M5137 Other intervertebral disc degeneration, lumbosacral region: Secondary | ICD-10-CM | POA: Diagnosis present

## 2012-04-06 DIAGNOSIS — Z823 Family history of stroke: Secondary | ICD-10-CM

## 2012-04-06 DIAGNOSIS — W010XXA Fall on same level from slipping, tripping and stumbling without subsequent striking against object, initial encounter: Secondary | ICD-10-CM

## 2012-04-06 DIAGNOSIS — J45909 Unspecified asthma, uncomplicated: Secondary | ICD-10-CM | POA: Diagnosis present

## 2012-04-06 DIAGNOSIS — Z91040 Latex allergy status: Secondary | ICD-10-CM

## 2012-04-06 DIAGNOSIS — M1711 Unilateral primary osteoarthritis, right knee: Secondary | ICD-10-CM | POA: Diagnosis present

## 2012-04-06 HISTORY — PX: TOTAL KNEE ARTHROPLASTY: SHX125

## 2012-04-06 LAB — DIFFERENTIAL
Basophils Absolute: 0.1 10*3/uL (ref 0.0–0.1)
Basophils Relative: 0 % (ref 0–1)
Eosinophils Absolute: 0.4 10*3/uL (ref 0.0–0.7)
Eosinophils Relative: 3 % (ref 0–5)
Lymphocytes Relative: 26 % (ref 12–46)
Lymphs Abs: 3.9 10*3/uL (ref 0.7–4.0)
Monocytes Absolute: 1.1 10*3/uL — ABNORMAL HIGH (ref 0.1–1.0)
Monocytes Relative: 7 % (ref 3–12)
Neutro Abs: 9.5 10*3/uL — ABNORMAL HIGH (ref 1.7–7.7)
Neutrophils Relative %: 64 % (ref 43–77)

## 2012-04-06 LAB — CBC
HCT: 37.2 % (ref 36.0–46.0)
Hemoglobin: 11.8 g/dL — ABNORMAL LOW (ref 12.0–15.0)
MCH: 24.1 pg — ABNORMAL LOW (ref 26.0–34.0)
MCHC: 31.7 g/dL (ref 30.0–36.0)
MCV: 76.1 fL — ABNORMAL LOW (ref 78.0–100.0)
Platelets: 346 10*3/uL (ref 150–400)
RBC: 4.89 MIL/uL (ref 3.87–5.11)
RDW: 16.9 % — ABNORMAL HIGH (ref 11.5–15.5)
WBC: 15 10*3/uL — ABNORMAL HIGH (ref 4.0–10.5)

## 2012-04-06 LAB — BASIC METABOLIC PANEL
BUN: 13 mg/dL (ref 6–23)
CO2: 23 mEq/L (ref 19–32)
Calcium: 9.7 mg/dL (ref 8.4–10.5)
Chloride: 102 mEq/L (ref 96–112)
Creatinine, Ser: 0.68 mg/dL (ref 0.50–1.10)
GFR calc Af Amer: >90 mL/min (ref >90)
GFR calc non Af Amer: >90 mL/min (ref >90)
Glucose, Bld: 102 mg/dL — ABNORMAL HIGH (ref 70–99)
Potassium: 3.5 mEq/L (ref 3.5–5.1)
Sodium: 140 mEq/L (ref 135–145)

## 2012-04-06 LAB — HCG, SERUM, QUALITATIVE: Preg, Serum: NEGATIVE

## 2012-04-06 SURGERY — ARTHROPLASTY, KNEE, TOTAL
Anesthesia: General | Site: Knee | Laterality: Right | Wound class: Clean

## 2012-04-06 MED ORDER — CEFUROXIME SODIUM 1.5 G IJ SOLR
INTRAMUSCULAR | Status: DC | PRN
  Administered 2012-04-06: 1.5 g

## 2012-04-06 MED ORDER — BUPIVACAINE-EPINEPHRINE 0.25% -1:200000 IJ SOLN
INTRAMUSCULAR | Status: DC | PRN
  Administered 2012-04-06: 30 mL

## 2012-04-06 MED ORDER — DEXAMETHASONE SODIUM PHOSPHATE 4 MG/ML IJ SOLN
INTRAMUSCULAR | Status: DC | PRN
  Administered 2012-04-06: 4 mg via INTRAVENOUS

## 2012-04-06 MED ORDER — OXYCODONE HCL 5 MG PO TABS
5.0000 mg | ORAL_TABLET | ORAL | Status: DC | PRN
  Administered 2012-04-06: 10 mg via ORAL
  Administered 2012-04-07 (×3): 5 mg via ORAL
  Administered 2012-04-07 – 2012-04-08 (×3): 10 mg via ORAL
  Filled 2012-04-06: qty 2
  Filled 2012-04-06: qty 1
  Filled 2012-04-06 (×4): qty 2

## 2012-04-06 MED ORDER — MENTHOL 3 MG MT LOZG: 1.0000 | LOZENGE | OROMUCOSAL | Status: DC | PRN

## 2012-04-06 MED ORDER — FENTANYL CITRATE 0.05 MG/ML IJ SOLN
INTRAMUSCULAR | Status: DC | PRN
  Administered 2012-04-06: 50 ug via INTRAVENOUS
  Administered 2012-04-06: 25 ug via INTRAVENOUS
  Administered 2012-04-06: 150 ug via INTRAVENOUS
  Administered 2012-04-06 (×4): 25 ug via INTRAVENOUS
  Administered 2012-04-06 (×2): 50 ug via INTRAVENOUS
  Administered 2012-04-06: 25 ug via INTRAVENOUS
  Administered 2012-04-06: 50 ug via INTRAVENOUS

## 2012-04-06 MED ORDER — POVIDONE-IODINE 7.5 % EX SOLN
Freq: Once | CUTANEOUS | Status: DC
  Filled 2012-04-06: qty 118

## 2012-04-06 MED ORDER — DIPHENHYDRAMINE HCL 25 MG PO CAPS
25.0000 mg | ORAL_CAPSULE | Freq: Once | ORAL | Status: AC
  Administered 2012-04-06: 25 mg via ORAL
  Filled 2012-04-06: qty 1

## 2012-04-06 MED ORDER — ONDANSETRON HCL 4 MG/2ML IJ SOLN
INTRAMUSCULAR | Status: DC | PRN
  Administered 2012-04-06: 4 mg via INTRAVENOUS

## 2012-04-06 MED ORDER — CYCLOBENZAPRINE HCL 10 MG PO TABS
10.0000 mg | ORAL_TABLET | Freq: Three times a day (TID) | ORAL | Status: DC
  Administered 2012-04-06 – 2012-04-08 (×7): 10 mg via ORAL
  Filled 2012-04-06 (×12): qty 1

## 2012-04-06 MED ORDER — HYDROMORPHONE HCL PF 1 MG/ML IJ SOLN
0.2500 mg | INTRAMUSCULAR | Status: DC | PRN
  Administered 2012-04-06 (×4): 0.5 mg via INTRAVENOUS

## 2012-04-06 MED ORDER — ONDANSETRON HCL 4 MG/2ML IJ SOLN: 4.0000 mg | Freq: Four times a day (QID) | INTRAMUSCULAR | Status: DC | PRN

## 2012-04-06 MED ORDER — ONDANSETRON HCL 4 MG PO TABS: 4.0000 mg | ORAL_TABLET | Freq: Four times a day (QID) | ORAL | Status: DC | PRN

## 2012-04-06 MED ORDER — ACETAMINOPHEN 325 MG PO TABS: 650.0000 mg | ORAL_TABLET | Freq: Four times a day (QID) | ORAL | Status: DC | PRN

## 2012-04-06 MED ORDER — ENOXAPARIN SODIUM 30 MG/0.3ML ~~LOC~~ SOLN
30.0000 mg | Freq: Two times a day (BID) | SUBCUTANEOUS | Status: DC
  Administered 2012-04-07 – 2012-04-08 (×3): 30 mg via SUBCUTANEOUS
  Filled 2012-04-06 (×5): qty 0.3

## 2012-04-06 MED ORDER — CHLORHEXIDINE GLUCONATE 4 % EX LIQD: 60.0000 mL | Freq: Once | CUTANEOUS | Status: DC

## 2012-04-06 MED ORDER — DROPERIDOL 2.5 MG/ML IJ SOLN: 0.6250 mg | INTRAMUSCULAR | Status: DC | PRN

## 2012-04-06 MED ORDER — METOCLOPRAMIDE HCL 5 MG/ML IJ SOLN: 5.0000 mg | Freq: Three times a day (TID) | INTRAMUSCULAR | Status: DC | PRN

## 2012-04-06 MED ORDER — DIPHENHYDRAMINE HCL 50 MG/ML IJ SOLN
INTRAMUSCULAR | Status: AC
  Filled 2012-04-06: qty 1

## 2012-04-06 MED ORDER — DIPHENHYDRAMINE HCL 50 MG/ML IJ SOLN
12.5000 mg | Freq: Once | INTRAMUSCULAR | Status: AC
  Administered 2012-04-06: 12.5 mg via INTRAVENOUS

## 2012-04-06 MED ORDER — ACETAMINOPHEN 10 MG/ML IV SOLN
INTRAVENOUS | Status: AC
  Filled 2012-04-06: qty 100

## 2012-04-06 MED ORDER — OXYCODONE HCL 10 MG PO TB12
20.0000 mg | ORAL_TABLET | Freq: Two times a day (BID) | ORAL | Status: DC
  Administered 2012-04-06 – 2012-04-08 (×5): 20 mg via ORAL
  Filled 2012-04-06 (×5): qty 2

## 2012-04-06 MED ORDER — DEXAMETHASONE SODIUM PHOSPHATE 10 MG/ML IJ SOLN
10.0000 mg | INTRAMUSCULAR | Status: DC
  Filled 2012-04-06 (×3): qty 1

## 2012-04-06 MED ORDER — CEFAZOLIN SODIUM-DEXTROSE 2-3 GM-% IV SOLR
2.0000 g | Freq: Four times a day (QID) | INTRAVENOUS | Status: AC
  Administered 2012-04-06 (×2): 2 g via INTRAVENOUS
  Filled 2012-04-06 (×2): qty 50

## 2012-04-06 MED ORDER — CELECOXIB 200 MG PO CAPS
200.0000 mg | ORAL_CAPSULE | Freq: Two times a day (BID) | ORAL | Status: DC
  Administered 2012-04-06 – 2012-04-08 (×5): 200 mg via ORAL
  Filled 2012-04-06 (×7): qty 1

## 2012-04-06 MED ORDER — DEXAMETHASONE 6 MG PO TABS
10.0000 mg | ORAL_TABLET | ORAL | Status: DC
  Administered 2012-04-07 – 2012-04-08 (×2): 10 mg via ORAL
  Filled 2012-04-06 (×3): qty 1

## 2012-04-06 MED ORDER — BUPIVACAINE-EPINEPHRINE PF 0.5-1:200000 % IJ SOLN
INTRAMUSCULAR | Status: DC | PRN
  Administered 2012-04-06: 150 mg

## 2012-04-06 MED ORDER — ZOLPIDEM TARTRATE 10 MG PO TABS
10.0000 mg | ORAL_TABLET | Freq: Every evening | ORAL | Status: DC | PRN
  Administered 2012-04-06: 10 mg via ORAL
  Filled 2012-04-06: qty 1

## 2012-04-06 MED ORDER — MIDAZOLAM HCL 5 MG/5ML IJ SOLN
INTRAMUSCULAR | Status: DC | PRN
  Administered 2012-04-06: 2 mg via INTRAVENOUS

## 2012-04-06 MED ORDER — DOCUSATE SODIUM 100 MG PO CAPS
100.0000 mg | ORAL_CAPSULE | Freq: Two times a day (BID) | ORAL | Status: DC
  Administered 2012-04-06 – 2012-04-08 (×6): 100 mg via ORAL
  Filled 2012-04-06 (×7): qty 1

## 2012-04-06 MED ORDER — PHENOL 1.4 % MT LIQD: 1.0000 | OROMUCOSAL | Status: DC | PRN

## 2012-04-06 MED ORDER — METOCLOPRAMIDE HCL 10 MG PO TABS: 5.0000 mg | ORAL_TABLET | Freq: Three times a day (TID) | ORAL | Status: DC | PRN

## 2012-04-06 MED ORDER — SORBITOL 70 % SOLN
30.0000 mL | Freq: Two times a day (BID) | Status: DC
  Administered 2012-04-07 (×2): 30 mL via ORAL
  Filled 2012-04-06 (×4): qty 30

## 2012-04-06 MED ORDER — GABAPENTIN 300 MG PO CAPS
300.0000 mg | ORAL_CAPSULE | Freq: Three times a day (TID) | ORAL | Status: DC
  Administered 2012-04-06 – 2012-04-08 (×6): 300 mg via ORAL
  Filled 2012-04-06 (×8): qty 1

## 2012-04-06 MED ORDER — BUPIVACAINE-EPINEPHRINE PF 0.25-1:200000 % IJ SOLN
INTRAMUSCULAR | Status: AC
  Filled 2012-04-06: qty 30

## 2012-04-06 MED ORDER — SODIUM CHLORIDE 0.9 % IR SOLN
Status: DC | PRN
  Administered 2012-04-06: 3000 mL
  Administered 2012-04-06: 1000 mL

## 2012-04-06 MED ORDER — LACTATED RINGERS IV SOLN
INTRAVENOUS | Status: DC | PRN
  Administered 2012-04-06 (×2): via INTRAVENOUS

## 2012-04-06 MED ORDER — HYDROMORPHONE HCL PF 1 MG/ML IJ SOLN
INTRAMUSCULAR | Status: AC
  Filled 2012-04-06: qty 1

## 2012-04-06 MED ORDER — CALCIUM CARBONATE-VITAMIN D 500-200 MG-UNIT PO TABS
1.0000 | ORAL_TABLET | Freq: Every day | ORAL | Status: DC
  Administered 2012-04-06 – 2012-04-08 (×3): 1 via ORAL
  Filled 2012-04-06 (×4): qty 1

## 2012-04-06 MED ORDER — ACETAMINOPHEN 10 MG/ML IV SOLN
INTRAVENOUS | Status: DC | PRN
  Administered 2012-04-06: 1000 mg via INTRAVENOUS

## 2012-04-06 MED ORDER — PROPOFOL 10 MG/ML IV BOLUS
INTRAVENOUS | Status: DC | PRN
  Administered 2012-04-06: 200 mg via INTRAVENOUS

## 2012-04-06 MED ORDER — OXYCODONE HCL 5 MG PO TABS
ORAL_TABLET | ORAL | Status: AC
  Filled 2012-04-06: qty 2

## 2012-04-06 MED ORDER — ACETAMINOPHEN 650 MG RE SUPP: 650.0000 mg | Freq: Four times a day (QID) | RECTAL | Status: DC | PRN

## 2012-04-06 MED ORDER — POTASSIUM CHLORIDE IN NACL 20-0.9 MEQ/L-% IV SOLN
INTRAVENOUS | Status: DC
  Administered 2012-04-06: 17:00:00 via INTRAVENOUS
  Administered 2012-04-07: 100 mL via INTRAVENOUS
  Filled 2012-04-06 (×7): qty 1000

## 2012-04-06 MED ORDER — HYDROMORPHONE HCL PF 1 MG/ML IJ SOLN
1.0000 mg | INTRAMUSCULAR | Status: DC | PRN
  Administered 2012-04-07: 1 mg via INTRAVENOUS
  Filled 2012-04-06: qty 1

## 2012-04-06 MED ORDER — LORAZEPAM 1 MG PO TABS
1.0000 mg | ORAL_TABLET | Freq: Four times a day (QID) | ORAL | Status: DC | PRN
  Administered 2012-04-06 (×2): 1 mg via ORAL
  Filled 2012-04-06 (×2): qty 1

## 2012-04-06 MED ORDER — POTASSIUM CHLORIDE CRYS ER 10 MEQ PO TBCR
10.0000 meq | EXTENDED_RELEASE_TABLET | Freq: Two times a day (BID) | ORAL | Status: DC
  Administered 2012-04-06 – 2012-04-08 (×5): 10 meq via ORAL
  Filled 2012-04-06 (×8): qty 1

## 2012-04-06 MED ORDER — CEFUROXIME SODIUM 1.5 G IJ SOLR
INTRAMUSCULAR | Status: AC
  Filled 2012-04-06: qty 1.5

## 2012-04-06 SURGICAL SUPPLY — 74 items
BANDAGE ELASTIC 4 VELCRO ST LF (GAUZE/BANDAGES/DRESSINGS) ×1 IMPLANT
BANDAGE ESMARK 6X9 LF (GAUZE/BANDAGES/DRESSINGS) ×1 IMPLANT
BLADE SAGITTAL 25.0X1.19X90 (BLADE) ×2 IMPLANT
BLADE SAW SGTL 11.0X1.19X90.0M (BLADE) IMPLANT
BLADE SAW SGTL 13.0X1.19X90.0M (BLADE) ×2 IMPLANT
BLADE SURG 10 STRL SS (BLADE) ×4 IMPLANT
BNDG CMPR 9X6 STRL LF SNTH (GAUZE/BANDAGES/DRESSINGS) ×1
BNDG CMPR MED 15X6 ELC VLCR LF (GAUZE/BANDAGES/DRESSINGS)
BNDG ELASTIC 6X15 VLCR STRL LF (GAUZE/BANDAGES/DRESSINGS) ×1 IMPLANT
BNDG ESMARK 6X9 LF (GAUZE/BANDAGES/DRESSINGS) ×2
BOWL SMART MIX CTS (DISPOSABLE) ×2 IMPLANT
CEMENT HV SMART SET (Cement) ×4 IMPLANT
CLOTH BEACON ORANGE TIMEOUT ST (SAFETY) ×2 IMPLANT
CLSR STERI-STRIP ANTIMIC 1/2X4 (GAUZE/BANDAGES/DRESSINGS) ×1 IMPLANT
COVER BACK TABLE 24X17X13 BIG (DRAPES) IMPLANT
COVER PROBE W GEL 5X96 (DRAPES) ×1 IMPLANT
COVER SURGICAL LIGHT HANDLE (MISCELLANEOUS) ×2 IMPLANT
CUFF TOURNIQUET SINGLE 34IN LL (TOURNIQUET CUFF) ×2 IMPLANT
CUFF TOURNIQUET SINGLE 44IN (TOURNIQUET CUFF) IMPLANT
DRAPE EXTREMITY T 121X128X90 (DRAPE) ×2 IMPLANT
DRAPE INCISE IOBAN 66X45 STRL (DRAPES) ×2 IMPLANT
DRAPE PROXIMA HALF (DRAPES) ×2 IMPLANT
DRAPE U-SHAPE 47X51 STRL (DRAPES) ×2 IMPLANT
DRSG ADAPTIC 3X8 NADH LF (GAUZE/BANDAGES/DRESSINGS) ×1 IMPLANT
DRSG PAD ABDOMINAL 8X10 ST (GAUZE/BANDAGES/DRESSINGS) ×2 IMPLANT
DURAPREP 26ML APPLICATOR (WOUND CARE) ×2 IMPLANT
ELECT CAUTERY BLADE 6.4 (BLADE) ×2 IMPLANT
ELECT REM PT RETURN 9FT ADLT (ELECTROSURGICAL) ×2
ELECTRODE REM PT RTRN 9FT ADLT (ELECTROSURGICAL) ×1 IMPLANT
EVACUATOR 1/8 PVC DRAIN (DRAIN) ×1 IMPLANT
FACESHIELD LNG OPTICON STERILE (SAFETY) ×3 IMPLANT
GLOVE BIO SURGEON STRL SZ7 (GLOVE) ×1 IMPLANT
GLOVE BIOGEL PI IND STRL 7.0 (GLOVE) ×1 IMPLANT
GLOVE BIOGEL PI IND STRL 7.5 (GLOVE) ×1 IMPLANT
GLOVE BIOGEL PI INDICATOR 7.0 (GLOVE) ×1
GLOVE BIOGEL PI INDICATOR 7.5 (GLOVE) ×1
GLOVE SS BIOGEL STRL SZ 7.5 (GLOVE) ×1 IMPLANT
GLOVE SUPERSENSE BIOGEL SZ 7.5 (GLOVE)
GLOVE SURG SS PI 7.0 STRL IVOR (GLOVE) ×4 IMPLANT
GLOVE SURG SS PI 7.5 STRL IVOR (GLOVE) ×1 IMPLANT
GOWN PREVENTION PLUS XLARGE (GOWN DISPOSABLE) ×4 IMPLANT
GOWN PREVENTION PLUS XXLARGE (GOWN DISPOSABLE) ×1 IMPLANT
GOWN STRL NON-REIN LRG LVL3 (GOWN DISPOSABLE) ×2 IMPLANT
GOWN STRL REIN XL XLG (GOWN DISPOSABLE) ×1 IMPLANT
HANDPIECE INTERPULSE COAX TIP (DISPOSABLE) ×2
HOOD PEEL AWAY FACE SHEILD DIS (HOOD) ×4 IMPLANT
IMMOBILIZER KNEE 22 UNIV (SOFTGOODS) IMPLANT
INSERT CUSHION PRONEVIEW LG (MISCELLANEOUS) ×2 IMPLANT
KIT BASIN OR (CUSTOM PROCEDURE TRAY) ×2 IMPLANT
KIT ROOM TURNOVER OR (KITS) ×2 IMPLANT
MANIFOLD NEPTUNE II (INSTRUMENTS) ×2 IMPLANT
NS IRRIG 1000ML POUR BTL (IV SOLUTION) ×2 IMPLANT
PACK TOTAL JOINT (CUSTOM PROCEDURE TRAY) ×2 IMPLANT
PAD ARMBOARD 7.5X6 YLW CONV (MISCELLANEOUS) ×4 IMPLANT
PAD CAST 4YDX4 CTTN HI CHSV (CAST SUPPLIES) ×1 IMPLANT
PADDING CAST COTTON 4X4 STRL (CAST SUPPLIES)
PADDING CAST COTTON 6X4 STRL (CAST SUPPLIES) ×1 IMPLANT
POSITIONER HEAD PRONE TRACH (MISCELLANEOUS) ×2 IMPLANT
RUBBERBAND STERILE (MISCELLANEOUS) ×2 IMPLANT
SET HNDPC FAN SPRY TIP SCT (DISPOSABLE) ×1 IMPLANT
SPONGE GAUZE 4X4 12PLY (GAUZE/BANDAGES/DRESSINGS) ×1 IMPLANT
STRIP CLOSURE SKIN 1/2X4 (GAUZE/BANDAGES/DRESSINGS) ×1 IMPLANT
SUCTION FRAZIER TIP 10 FR DISP (SUCTIONS) ×2 IMPLANT
SUT MNCRL AB 3-0 PS2 18 (SUTURE) ×2 IMPLANT
SUT VIC AB 0 CT1 27 (SUTURE) ×4
SUT VIC AB 0 CT1 27XBRD ANBCTR (SUTURE) ×2 IMPLANT
SUT VIC AB 2-0 CT1 27 (SUTURE) ×4
SUT VIC AB 2-0 CT1 TAPERPNT 27 (SUTURE) ×2 IMPLANT
SUT VLOC 180 0 24IN GS25 (SUTURE) ×2 IMPLANT
SYR 30ML SLIP (SYRINGE) ×2 IMPLANT
TOWEL OR 17X24 6PK STRL BLUE (TOWEL DISPOSABLE) ×2 IMPLANT
TOWEL OR 17X26 10 PK STRL BLUE (TOWEL DISPOSABLE) ×2 IMPLANT
TRAY FOLEY CATH 14FR (SET/KITS/TRAYS/PACK) ×2 IMPLANT
WATER STERILE IRR 1000ML POUR (IV SOLUTION) ×5 IMPLANT

## 2012-04-06 NOTE — Anesthesia Postprocedure Evaluation (Signed)
Anesthesia Post Note  Patient: [name]  Procedure(s) Performed: Procedure(s) (LRB): TOTAL KNEE ARTHROPLASTY (Right)  Anesthesia type: general  Patient location: PACU  Post pain: Pain level controlled  Post assessment: Patient's Cardiovascular Status Stable  Last Vitals:  Filed Vitals:   04/06/12 0930  BP: 123/74  Pulse:   Temp: 36.2 C  Resp: 16    Post vital signs: Reviewed and stable  Level of consciousness: sedated  Complications: No apparent anesthesia complications

## 2012-04-06 NOTE — Progress Notes (Signed)
UR COMPLETED  

## 2012-04-06 NOTE — Transfer of Care (Addendum)
Immediate Anesthesia Transfer of Care Note  Patient: [name]  Procedure(s) Performed: Procedure(s) (LRB): TOTAL KNEE ARTHROPLASTY (Right)  Patient Location: PACU  Anesthesia Type: General  Level of Consciousness: sedated and comfortable with oral airway in place anfd face mask O2  Airway & Oxygen Therapy: Patient Spontanous Breathing and Patient connected to face mask  Post-op Assessment: Report given to PACU RN and Post -op Vital signs reviewed and stable  Post vital signs: Reviewed and stable  Complications: No apparent anesthesia complications

## 2012-04-06 NOTE — Anesthesia Procedure Notes (Addendum)
Procedure Name: LMA Insertion Date/Time: 04/06/2012 7:24 AM Performed by: Marena Chancy Pre-anesthesia Checklist: Patient identified, Timeout performed, Suction available, Patient being monitored and Emergency Drugs available Patient Re-evaluated:Patient Re-evaluated prior to inductionPreoxygenation: Pre-oxygenation with 100% oxygen Intubation Type: IV induction LMA: LMA inserted LMA Size: 4.0 Number of attempts: 1 Placement Confirmation: positive ETCO2 and breath sounds checked- equal and bilateral Tube secured with: Tape Dental Injury: Teeth and Oropharynx as per pre-operative assessment        Narrative:    Anesthesia Regional Block:  Femoral nerve block  Pre-Anesthetic Checklist: ,, timeout performed, Correct Patient, Correct Site, Correct Laterality, Correct Procedure,, site marked, risks and benefits discussed, Surgical consent,  Pre-op evaluation,  At surgeon's request and post-op pain management  Laterality: Right  Prep: chloraprep       Needles:  Injection technique: Single-shot  Needle Type: Echogenic Stimulator Needle     Needle Length: 5cm 5 cm Needle Gauge: 22 and 22 G    Additional Needles:  Procedures: ultrasound guided and nerve stimulator Femoral nerve block  Nerve Stimulator or Paresthesia:  Response: quadraceps contraction, 0.45 mA,   Additional Responses:   Narrative:  Start time: 04/06/2012 7:02 AM End time: 04/06/2012 7:12 AM Injection made incrementally with aspirations every 5 mL.  Performed by: Personally  Anesthesiologist: Halford Decamp, MD  Additional Notes: Functioning IV was confirmed and monitors were applied.  A 50mm 22ga Arrow echogenic stimulator needle was used. Sterile prep and drape,hand hygiene and sterile gloves were used. Ultrasound guidance: relevant anatomy identified, needle position confirmed, local anesthetic spread visualized around nerve(s)., vascular puncture avoided.  Image printed for medical record. Negative  aspiration and negative test dose prior to incremental administration of local anesthetic. The patient tolerated the procedure well.    Femoral nerve block

## 2012-04-06 NOTE — Anesthesia Preprocedure Evaluation (Addendum)
Anesthesia Evaluation  Patient identified by MRN, date of birth, ID band Patient awake    Reviewed: Allergy & Precautions, H&P , NPO status , Patient's Chart, lab work & pertinent test results  History of Anesthesia Complications Negative for: history of anesthetic complications  Airway Mallampati: II TM Distance: >3 FB Neck ROM: Full    Dental  (+) Dental Advisory Given, Partial Upper, Partial Lower and Missing   Pulmonary shortness of breath and with exertion, asthma ,  breath sounds clear to auscultation  Pulmonary exam normal       Cardiovascular hypertension, Pt. on medications Rhythm:Regular Rate:Normal     Neuro/Psych PSYCHIATRIC DISORDERS Anxiety Depression    GI/Hepatic Neg liver ROS, GERD-  Medicated and Controlled,  Endo/Other  Hyperthyroidism Morbid obesity  Renal/GU negative Renal ROS     Musculoskeletal  (+) Fibromyalgia -, narcotic dependent  Abdominal   Peds  Hematology   Anesthesia Other Findings   Reproductive/Obstetrics                          Anesthesia Physical Anesthesia Plan  ASA: III  Anesthesia Plan: General   Post-op Pain Management:    Induction: Intravenous  Airway Management Planned: LMA  Additional Equipment:   Intra-op Plan:   Post-operative Plan: Extubation in OR  Informed Consent: I have reviewed the patients History and Physical, chart, labs and discussed the procedure including the risks, benefits and alternatives for the proposed anesthesia with the patient or authorized representative who has indicated his/her understanding and acceptance.   Dental advisory given  Plan Discussed with: CRNA, Anesthesiologist and Surgeon  Anesthesia Plan Comments:         Anesthesia Quick Evaluation

## 2012-04-06 NOTE — Interval H&P Note (Signed)
History and Physical Interval Note:  04/06/2012 7:06 AM  [name]  has presented today for surgery, with the diagnosis of DJD RIGHT KNEE  The various methods of treatment have been discussed with the patient and family. After consideration of risks, benefits and other options for treatment, the patient has consented to  Procedure(s) (LRB): TOTAL KNEE ARTHROPLASTY (Right) as a surgical intervention .  The patient's history has been reviewed, patient examined, no change in status, stable for surgery.  I have reviewed the patients' chart and labs.  Questions were answered to the patient's satisfaction.     Salvatore Marvel A

## 2012-04-06 NOTE — H&P (View-Only) (Signed)
[name] is an 51 y.o. female.   Chief Complaint: right knee DJD HPI: [name] comes in today for evaluation of her right knee.  She is a 51 year-old African American female with a history of bilateral knee DJD.  She has failed conservative care, including anti-inflammatories and intraarticular Cortisone injections, as well as intraarticular Supartz injections.  She requires assistance rising from a chair and due to the excruciating pain is planning on a left total knee replacement.  She is disabled from chronic back pain and has recently undergone a lumbar fusion.  She had a left total knee on 01/27/2012.  Now ready for her right.  Past Medical History  Diagnosis Date  . Bronchitis   . Jones fracture     left foot fifth metatarsal  . Fibromyalgia   . Varicose vein     protrudes above skin-per pt;vein popped and bruised;ultrasound done to make sure that there were no clots;noclots were found  . Chronic back pain   . Multiple allergies   . Asthma     but pt states not severe enough to even have an inhaler  . Shortness of breath     with exertion;pt states that its related to her weight  . Hyperthyroidism     but pt doesn't take anything for it;thyroid nodules  . Headache   . Dizziness     rarely  . Impaired memory   . Joint pain   . Joint swelling   . GERD (gastroesophageal reflux disease)     doesn't take anything for it  . IBS (irritable bowel syndrome)   . Arthritis     bilateral knees  . Bruising     pt states unexplained d/t fibromyalgia  . Urinary frequency   . Increased thirst   . Anemia   . Anxiety     takes Ativan and Valium  . Depression     from Fibromyalgia diagnosis  . Insomnia     takes Ambien  . Sore gums     this is why pt is on Amoxil-only takes for dental work  . Hypertension     recently diagnosed (this month)  . Left knee DJD     Past Surgical History  Procedure Date  . Cesarean section 1984  . Spine surgery 2004    Cervical plate, ACDF  .  Knee surgery 2005    Left knee arthroscopy  . Nasal septoplasty w/ turbinoplasty 2007  . Tonsillectomy 2007  . Fracture surgery 05/08/2010    Jones fracture left foot fifth metatarsal  . Anterior lumbar fusion 09/20/2011    Procedure: ANTERIOR LUMBAR FUSION 1 LEVEL;  Surgeon: Kyle L Cabbell;  Location: [facility];  Service: Neurosurgery;  Laterality: N/A;  Lumbar five-Sacral One Anterior Lumbar Interbody Fusion /Dr. Early to Approach   . Cervical disc surgery     WITH TITANIUM PLATE IN NECK---LEFT SIDE  . Abdominal hysterectomy     partial hysterectomy -mid 2000's,   . Total knee arthroplasty 01/27/2012    Procedure: TOTAL KNEE ARTHROPLASTY;  Surgeon: Robert A Wainer, MD;  Location: [facility] OR;  Service: Orthopedics;  Laterality: Left;  DR WAINER WANTS 90 MINUTES FOR THIS CASE  . Steriod injection 01/27/2012    Procedure: STEROID INJECTION;  Surgeon: Robert A Wainer, MD;  Location: [facility] OR;  Service: Orthopedics;  Laterality: Right;  . Joint replacement 01-27-2012    left total knee    Family History  Problem Relation Age of Onset  .   Stroke Father   . Anesthesia problems Neg Hx   . Hypotension Neg Hx   . Malignant hyperthermia Neg Hx   . Pseudochol deficiency Neg Hx   . Arthritis Sister    Social History:  reports that she quit smoking about 13 years ago. She has never used smokeless tobacco. She reports that she does not drink alcohol or use illicit drugs.  Allergies:  Allergies  Allergen Reactions  . Shellfish Allergy Anaphylaxis  . Latex Other (See Comments)    Itching/rash  . Other Other (See Comments)    MSG-   Current Outpatient Prescriptions on File Prior to Visit  Medication Sig Dispense Refill  . calcium-vitamin D (OSCAL WITH D) 500-200 MG-UNIT per tablet Take 1 tablet by mouth daily.      . cyclobenzaprine (FLEXERIL) 10 MG tablet Take 10 mg by mouth 3 (three) times daily.        . diclofenac sodium (VOLTAREN) 1 % GEL Apply 1 application topically 2 (two) times daily as  needed. For right shoulder and arm and legs      . DiphenhydrAMINE HCl (BENADRYL PO) Take 2 capsules by mouth 1 day or 1 dose. In case she comes in contact with shellfish or shrimp      . Docusate Calcium (STOOL SOFTENER PO) Take 1 tablet by mouth daily.      . gabapentin (NEURONTIN) 300 MG capsule Take 300 mg by mouth 3 (three) times daily.        . hydrochlorothiazide (HYDRODIURIL) 25 MG tablet Take 25 mg by mouth daily. One tablet by mouth in the morning.       . LORazepam (ATIVAN) 1 MG tablet Take 1 mg by mouth 2 (two) times daily as needed. For anxiety attacks      . meloxicam (MOBIC) 15 MG tablet Take 15 mg by mouth daily.      . oxyCODONE-acetaminophen (PERCOCET) 5-325 MG per tablet Take 1 tablet by mouth every 4 (four) hours as needed.      . zolpidem (AMBIEN) 10 MG tablet Take 10 mg by mouth at bedtime as needed. For sleep         (Not in a hospital admission)  No results found for this or any previous visit (from the past 48 hour(s)). No results found.  Review of Systems  Constitutional: Negative.   HENT: Negative.   Eyes: Negative.   Respiratory: Negative.   Cardiovascular: Negative.   Gastrointestinal: Negative.   Genitourinary: Negative.   Musculoskeletal: Positive for joint pain.  Skin: Negative.   Neurological: Negative.   Endo/Heme/Allergies: Negative.   Psychiatric/Behavioral: Negative.     Blood pressure 123/83, pulse 112, temperature 98.4 F (36.9 C), height 5' 2" (1.575 m), weight 106.142 kg (234 lb), SpO2 97.00%. Physical Exam  Constitutional: She is oriented to person, place, and time. She appears well-developed and well-nourished.  HENT:  Head: Normocephalic and atraumatic.  Mouth/Throat: Oropharynx is clear and moist.  Eyes: EOM are normal. Pupils are equal, round, and reactive to light.  Neck: Neck supple.  Cardiovascular: Regular rhythm.        tachycardia  Respiratory: Breath sounds normal.  GI: Soft. Bowel sounds are normal.  Genitourinary:        Not pertinent to current symptomatology therefore not examined.  Musculoskeletal:       Left knee has active range of motion 0-120 degrees.  Surgical wound is well approximated and healed.  4-/5 strength moderately antalgic gait. Right knee active range of motion   0-120 degrees 2+ crepitus 1+ synovitis medial joint line tenderness normal patella tracking mild varus deformity. She is neurovascularly intact.    Neurological: She is alert and oriented to person, place, and time.  Skin: Skin is warm and dry.  Psychiatric: She has a normal mood and affect. Her behavior is normal. Judgment and thought content normal.     Assessment Patient Active Problem List  Diagnosis  . Lumbar degenerative disc disease  . Fibromyalgia  . Joint pain  . GERD (gastroesophageal reflux disease)  . Anxiety  . Depression  . Hypertension  . Left knee DJD  . Hypokalemia  . Postoperative anemia due to acute blood loss  . Right knee DJD  . Status post left total prosthetic replacement of knee joint using cement   Plan Right total knee replacement.  The risks, benefits, and possible complications of the procedure were discussed in detail with the patient.  The patient is without question.  Mada Sadik J 03/24/2012, 3:16 PM    

## 2012-04-06 NOTE — Preoperative (Signed)
Beta Blockers   Reason not to administer Beta Blockers:Not Applicable 

## 2012-04-06 NOTE — Progress Notes (Signed)
Orthopedic Tech Progress Note Patient Details:  [name] 1961/08/29 [id number]  CPM Right Knee CPM Right Knee: On Right Knee Flexion (Degrees): 90  Right Knee Extension (Degrees): 0    Brennan Litzinger T 04/06/2012, 12:47 PM

## 2012-04-06 NOTE — Op Note (Signed)
MRN:     [id number] DOB/AGE:    10/10/1960 / 51 y.o.       OPERATIVE REPORT    DATE OF PROCEDURE:  04/06/2012       PREOPERATIVE DIAGNOSIS:   DJD RIGHT KNEE      Estimated Body mass index is 45.00 kg/(m^2) as calculated from the following:   Height as of 01/21/12: 5\' 2" (1.575 m).   Weight as of 01/21/12: 246 lb 0.5 oz(111.6 kg).                                                        POSTOPERATIVE DIAGNOSIS:   DJD RIGHT KNEE                                                                      PROCEDURE:  Procedure(s): TOTAL KNEE ARTHROPLASTY Using Depuy Sigma RP implants #3 Femur, #3Tibia, 12.32mm sigma RP bearing, 32 Patella     SURGEON: Jacques Willingham A    ASSISTANT:  Kirstin Shepperson PA-C   (Present and scrubbed throughout the case, critical for assistance with exposure, retraction, instrumentation, and closure.)         ANESTHESIA: GET with Femoral Nerve Block  DRAINS: foley, 2 medium hemovac in knee   TOURNIQUET TIME:   COMPLICATIONS:  None     SPECIMENS: None   INDICATIONS FOR PROCEDURE: The patient has  DJD RIGHT KNEE, varus deformities, XR shows bone on bone arthritis. Patient has failed all conservative measures including anti-inflammatory medicines, narcotics, attempts at  exercise and weight loss, cortisone injections and viscosupplementation.  Risks and benefits of surgery have been discussed, questions answered.   DESCRIPTION OF PROCEDURE: The patient identified by armband, received  right femoral nerve block and IV antibiotics, in the holding area at [facility]. Patient taken to the operating room, appropriate anesthetic  monitors were attached General endotracheal anesthesia induced with  the patient in supine position, Foley catheter was inserted. Tourniquet  applied high to the operative thigh. Lateral post and foot positioner  applied to the table, the lower extremity was then prepped and draped  in usual sterile fashion from the ankle to the  tourniquet. Time-out procedure was performed. The limb was wrapped with an Esmarch bandage and the tourniquet inflated to 365 mmHg. We began the operation by making the anterior midline incision starting at handbreadth above the patella going over the patella 1 cm medial to and  4 cm distal to the tibial tubercle. Small bleeders in the skin and the  subcutaneous tissue identified and cauterized. Transverse retinaculum was incised and reflected medially and a medial parapatellar arthrotomy was accomplished. the patella was everted and theprepatellar fat pad resected. The superficial medial collateral  ligament was then elevated from anterior to posterior along the proximal  flare of the tibia and anterior half of the menisci resected. The knee was hyperflexed exposing bone on bone arthritis. Peripheral and notch osteophytes as well as the cruciate ligaments were then resected. We continued to  work our way around posteriorly along the proximal tibia, and externally  rotated  the tibia subluxing it out from underneath the femur. A McHale  retractor was placed through the notch and a lateral Hohmann retractor  placed, and we then drilled through the proximal tibia in line with the  axis of the tibia followed by an intramedullary guide rod and 2-degree  posterior slope cutting guide. The tibial cutting guide was pinned into place  allowing resection of 4 mm of bone medially and about 7 mm of bone  laterally because of her varus deformity. Satisfied with the tibial resection, we then  entered the distal femur 2 mm anterior to the PCL origin with the  intramedullary guide rod and applied the distal femoral cutting guide  set at 11mm, with 5 degrees of valgus. This was pinned along the  epicondylar axis. At this point, the distal femoral cut was accomplished without difficulty. We then sized for a #3 femoral component and pinned the guide in 3 degrees of external rotation.The chamfer cutting guide was pinned  into place. The anterior, posterior, and chamfer cuts were accomplished without difficulty followed by  the Sigma RP box cutting guide and the box cut. We also removed posterior osteophytes from the posterior femoral condyles. At this  time, the knee was brought into full extension. We checked our  extension and flexion gaps and found them symmetric at 10mm.  The patella thickness measured at 24 mm. We set the cutting guide at 15 and removed the posterior 9.5-10 mm  of the patella sized for 32 button and drilled the lollipop. The knee  was then once again hyperflexed exposing the proximal tibia. We sized for a #3 tibial base plate, applied the smokestack and the conical reamer followed by the the Delta fin keel punch. We then hammered into place the Sigma RP trial femoral component, inserted a 12.5-mm trial bearing, trial patellar button, and took the knee through range of motion from 0-130 degrees. No thumb pressure was required for patellar  tracking. At this point, all trial components were removed, a double batch of DePuy HV cement with 1500 mg of Zinacef was mixed and applied to all bony metallic mating surfaces except for the posterior condyles of the femur itself. In order, we  hammered into place the tibial tray and removed excess cement, the femoral component and removed excess cement, a 12.5-mm Sigma RP bearing  was inserted, and the knee brought to full extension with compression.  The patellar button was clamped into place, and excess cement  removed. While the cement cured the wound was irrigated out with normal saline solution pulse lavage, and medium Hemovac drains were placed.. Ligament stability and patellar tracking were checked and found to be excellent. The tourniquet was then released and hemostasis was obtained with cautery. The parapatellar arthrotomy was closed with Z lock suture. The subcutaneous tissue with 0 and 2-0 undyed  Vicryl suture, and 4-0 Monocryl.. A dressing of  Xeroform,  4 x 4, dressing sponges, Webril, and Ace wrap applied. Needle and sponge count were correct times 2.The patient awakened, extubated, and taken to recovery room without difficulty. Vascular status was normal, pulses 2+ and symmetric.   Shellie Goettl A 04/06/2012, 9:03 AM

## 2012-04-07 ENCOUNTER — Inpatient Hospital Stay (HOSPITAL_COMMUNITY): Payer: Medicare Other

## 2012-04-07 ENCOUNTER — Encounter (HOSPITAL_COMMUNITY): Payer: Self-pay | Admitting: Orthopedic Surgery

## 2012-04-07 DIAGNOSIS — D72829 Elevated white blood cell count, unspecified: Secondary | ICD-10-CM | POA: Diagnosis present

## 2012-04-07 LAB — CBC
HCT: 30 % — ABNORMAL LOW (ref 36.0–46.0)
Hemoglobin: 9.5 g/dL — ABNORMAL LOW (ref 12.0–15.0)
MCH: 23.9 pg — ABNORMAL LOW (ref 26.0–34.0)
MCHC: 31.7 g/dL (ref 30.0–36.0)
MCV: 75.6 fL — ABNORMAL LOW (ref 78.0–100.0)
Platelets: 286 10*3/uL (ref 150–400)
RBC: 3.97 MIL/uL (ref 3.87–5.11)
RDW: 17 % — ABNORMAL HIGH (ref 11.5–15.5)
WBC: 15.3 10*3/uL — ABNORMAL HIGH (ref 4.0–10.5)

## 2012-04-07 LAB — BASIC METABOLIC PANEL
BUN: 11 mg/dL (ref 6–23)
CO2: 27 mEq/L (ref 19–32)
Calcium: 8.8 mg/dL (ref 8.4–10.5)
Chloride: 102 mEq/L (ref 96–112)
Creatinine, Ser: 0.67 mg/dL (ref 0.50–1.10)
GFR calc Af Amer: >90 mL/min (ref >90)
GFR calc non Af Amer: >90 mL/min (ref >90)
Glucose, Bld: 118 mg/dL — ABNORMAL HIGH (ref 70–99)
Potassium: 3.6 mEq/L (ref 3.5–5.1)
Sodium: 137 mEq/L (ref 135–145)

## 2012-04-07 MED ORDER — SODIUM CHLORIDE 0.9 % IV BOLUS (SEPSIS)
500.0000 mL | Freq: Once | INTRAVENOUS | Status: AC
  Administered 2012-04-07: 500 mL via INTRAVENOUS

## 2012-04-07 MED ORDER — SODIUM CHLORIDE 0.9 % IV BOLUS (SEPSIS)
500.0000 mL | Freq: Once | INTRAVENOUS | Status: AC
Start: 2012-04-07 — End: 2012-04-07
  Administered 2012-04-07: 14:00:00 via INTRAVENOUS

## 2012-04-07 MED ORDER — DIPHENHYDRAMINE HCL 25 MG PO CAPS: 25.0000 mg | ORAL_CAPSULE | Freq: Four times a day (QID) | ORAL | Status: DC | PRN

## 2012-04-07 NOTE — Evaluation (Signed)
Physical Therapy Evaluation Patient Details Name: [name] MRN: [id number] DOB: 1961/05/14 Today's Date: 04/07/2012 Time: 4098-1191 PT Time Calculation (min): 30 min  PT Assessment / Plan / Recommendation Clinical Impression  Pt s/p R TKA presenting with decreased R knee ROM, R LE strength, and increased R LE pain. Patient familar with therapy due to having L TKA done 01/27/12. Patient demonstrates potential to return home with intermittent support, HHPT, and recommended DME as anticipated by MD.    PT Assessment  Patient needs continued PT services    Follow Up Recommendations  Home health PT;Supervision/Assistance - 24 hour    Barriers to Discharge        Equipment Recommendations  None recommended by PT (has DME from previous surgery)    Recommendations for Other Services     Frequency 7X/week    Precautions / Restrictions Precautions Precautions: Knee Required Braces or Orthoses: Knee Immobilizer - Right Knee Immobilizer - Right: Discontinue post op day 2;On when out of bed or walking Restrictions Weight Bearing Restrictions: Yes RLE Weight Bearing: Weight bearing as tolerated   Pertinent Vitals/Pain 10/10 R knee pain      Mobility  Bed Mobility Bed Mobility: Supine to Sit Supine to Sit: HOB flat;4: Min assist Details for Bed Mobility Assistance: increased time, labored effort, request HOB elevated however instructed patient she will not have a hospital bed at home, pt agreed. Transfers Transfers: Sit to Stand;Stand to Sit Sit to Stand: 4: Min assist;With upper extremity assist;From bed Stand to Sit: 4: Min assist;With upper extremity assist;To chair/3-in-1;With armrests Details for Transfer Assistance: v/c's for safety, hand placement, and R LE management Ambulation/Gait Ambulation/Gait Assistance: 4: Min assist Ambulation Distance (Feet): 12 Feet (x2, to/from bathroom) Assistive device: Rolling walker Ambulation/Gait Assistance Details: max directional  v/c's, minor R Knee buckling despite wearing R KI, increased bilat UE WBing Gait Pattern: Step-to pattern;Decreased step length - right;Decreased stance time - right;Antalgic Gait velocity: slow Stairs: No    Exercises Total Joint Exercises Ankle Circles/Pumps: AROM;Both;10 reps;Supine Quad Sets: Right;10 reps;Supine;AROM Heel Slides: AAROM;Right;10 reps;Supine Goniometric ROM: 55 deg active R knee flex   PT Diagnosis: Difficulty walking;Abnormality of gait;Generalized weakness;Acute pain  PT Problem List: Decreased strength;Decreased range of motion;Decreased balance;Decreased activity tolerance;Decreased mobility PT Treatment Interventions: DME instruction;Gait training;Stair training;Functional mobility training;Therapeutic activities;Therapeutic exercise   PT Goals Acute Rehab PT Goals PT Goal Formulation: With patient Time For Goal Achievement: 04/14/12 Potential to Achieve Goals: Good Pt will go Supine/Side to Sit: with modified independence;with HOB 0 degrees PT Goal: Supine/Side to Sit - Progress: Goal set today Pt will go Sit to Supine/Side: with modified independence;with HOB 0 degrees PT Goal: Sit to Supine/Side - Progress: Goal set today Pt will go Sit to Stand: with modified independence;with upper extremity assist (up to RW.) PT Goal: Sit to Stand - Progress: Goal set today Pt will Ambulate: 51 - 150 feet;with modified independence;with rolling walker PT Goal: Ambulate - Progress: Goal set today Pt will Go Up / Down Stairs: 3-5 stairs;with supervision;with rail(s) (with bilat HR.) PT Goal: Up/Down Stairs - Progress: Goal set today Pt will Perform Home Exercise Program: Independently PT Goal: Perform Home Exercise Program - Progress: Goal set today  Visit Information  Last PT Received On: 04/07/12 Assistance Needed: +1    Subjective Data  Subjective: Pt received supine in bed with 10/10 R knee pain   Prior Functioning  Home Living Lives With: Alone Available  Help at Discharge: Family;Available PRN/intermittently Type of Home: House Home  Access: Stairs to enter [employer] of Steps: 4 Entrance Stairs-Rails: Can reach both Home Layout: One level Bathroom Shower/Tub: Engineer, manufacturing systems: Standard Bathroom Accessibility: Yes How Accessible: Accessible via walker Home Adaptive Equipment: Bedside commode/3-in-1;Shower chair with back;Walker - rolling Prior Function Level of Independence: Independent with assistive device(s) Able to Take Stairs?: Yes Driving: No Vocation: On disability Comments: Pt had L TKA 01/27/12. Communication Communication: No difficulties Dominant Hand: Right    Cognition  Overall Cognitive Status: Appears within functional limits for tasks assessed/performed Arousal/Alertness: Awake/alert Orientation Level: Oriented X4 / Intact Behavior During Session: [facility] for tasks performed    Extremity/Trunk Assessment Right Upper Extremity Assessment RUE ROM/Strength/Tone: Within functional levels Left Upper Extremity Assessment LUE ROM/Strength/Tone: Within functional levels Right Lower Extremity Assessment RLE ROM/Strength/Tone: Deficits;Due to pain RLE ROM/Strength/Tone Deficits: able to initiate quad set, ankle/hip WFL Left Lower Extremity Assessment LLE ROM/Strength/Tone: [facility] for tasks assessed Trunk Assessment Trunk Assessment: Normal   Balance    End of Session PT - End of Session Equipment Utilized During Treatment: Gait belt;Right knee immobilizer Activity Tolerance: Patient tolerated treatment well;Patient limited by pain Patient left: in chair;with call bell/phone within reach Nurse Communication: Mobility status  GP     Marcene Brawn 04/07/2012, 9:58 AM   Lewis Shock, PT, DPT Pager #: [phone] Office #: [phone]

## 2012-04-07 NOTE — Evaluation (Signed)
Occupational Therapy Evaluation Patient Details Name: [name] MRN: [id number] DOB: Feb 24, 1961 Today's Date: 04/07/2012 Time: 0865-7846 OT Time Calculation (min): 38 min  OT Assessment / Plan / Recommendation Clinical Impression  This 51 yo female s/p RTKA presents to acute OT with the problems below. Will benefit from acute OT with follow up HHOT to get to an I/Mod I level.    OT Assessment  Patient needs continued OT Services    Follow Up Recommendations  Home health OT    Barriers to Discharge None    Equipment Recommendations  None recommended by OT    Recommendations for Other Services    Frequency  Min 2X/week    Precautions / Restrictions Precautions Precautions: Fall;Knee Required Braces or Orthoses: Knee Immobilizer - Right Knee Immobilizer - Right: Discontinue post op day 2;On when out of bed or walking Restrictions Weight Bearing Restrictions: Yes RLE Weight Bearing: Weight bearing as tolerated   Pertinent Vitals/Pain Fall during my session today    ADL  Eating/Feeding: Simulated;Independent Where Assessed - Eating/Feeding: Bed level Grooming: Simulated;Set up Where Assessed - Grooming: Supported sitting Upper Body Bathing: Simulated;Set up Where Assessed - Upper Body Bathing: Supported sitting Lower Body Bathing: Simulated;Maximal assistance Where Assessed - Lower Body Bathing: Supported sit to stand Upper Body Dressing: Set up Where Assessed - Upper Body Dressing: Supported sitting Lower Body Dressing: Simulated;+1 Total assistance Where Assessed - Lower Body Dressing: Supported sit to Pharmacist, hospital: Performed;Minimal Dentist Method: Sit to stand;Stand pivot Acupuncturist: Raised toilet seat with arms (or 3-in-1 over toilet) Toileting - Clothing Manipulation and Hygiene: Performed;Modified independent Where Assessed - Toileting Clothing Manipulation and Hygiene: Standing Equipment Used: Rolling  walker Transfers/Ambulation Related to ADLs: Min A ADL Comments: Pt fell with me going back from 3-n-1 to recliner due to what she said that her red gripper sock caught her toe as she took as step and she then pitched forward    OT Diagnosis: Generalized weakness;Acute pain  OT Problem List: Decreased strength;Pain;Decreased knowledge of use of DME or AE OT Treatment Interventions: Self-care/ADL training;Patient/family education;Balance training;DME and/or AE instruction   OT Goals Acute Rehab OT Goals OT Goal Formulation: With patient Time For Goal Achievement: 04/14/12 Potential to Achieve Goals: Good ADL Goals Pt Will Perform Grooming: with set-up;Unsupported;Standing at sink (2 tasks) ADL Goal: Grooming - Progress: Goal set today Pt Will Perform Lower Body Dressing: with set-up;with supervision;Unsupported;Sit to stand from bed;Sit to stand from chair;with adaptive equipment ADL Goal: Lower Body Dressing - Progress: Goal set today Pt Will Transfer to Toilet: with supervision;Ambulation;with DME;3-in-1 ADL Goal: Toilet Transfer - Progress: Goal set today Pt Will Perform Toileting - Clothing Manipulation: Independently;Standing ADL Goal: Toileting - Clothing Manipulation - Progress: Goal set today Pt Will Perform Toileting - Hygiene: Independently;Sit to stand from 3-in-1/toilet ADL Goal: Toileting - Hygiene - Progress: Goal set today Miscellaneous OT Goals Miscellaneous OT Goal #1: Pt will be Independent in/OOB for BADLs OT Goal: Miscellaneous Goal #1 - Progress: Goal set today  Visit Information  Last OT Received On: 04/07/12 Assistance Needed: +1    Subjective Data  Subjective: My knee and first and second toe hurt and burn (post fall) Patient Stated Goal: Did not ask   Prior Functioning  Home Living Lives With: Alone Available Help at Discharge: Family;Available PRN/intermittently Type of Home: House Home Access: Stairs to enter [employer] of Steps:  4 Entrance Stairs-Rails: Can reach both Home Layout: One level Bathroom Shower/Tub: Tub/shower unit [employer]  Toilet: Standard Bathroom Accessibility: Yes Home Adaptive Equipment: Bedside commode/3-in-1;Shower chair with back;Walker - rolling Prior Function Level of Independence: Independent with assistive device(s) Able to Take Stairs?: Yes Driving: No Vocation: On disability Comments: LTKA April 2013 Communication Communication: No difficulties Dominant Hand: Right    Cognition  Overall Cognitive Status: Appears within functional limits for tasks assessed/performed Arousal/Alertness: Awake/alert Orientation Level: Appears intact for tasks assessed Behavior During Session: [facility] for tasks performed    Extremity/Trunk Assessment Right Upper Extremity Assessment RUE ROM/Strength/Tone: Within functional levels Left Upper Extremity Assessment LUE ROM/Strength/Tone: Within functional levels   Mobility Bed Mobility Bed Mobility: Supine to Sit Supine to Sit: HOB flat;4: Min assist Details for Bed Mobility Assistance: increased time, labored effort, request HOB elevated however instructed patient she will not have a hospital bed at home, pt agreed. Transfers Transfers: Sit to Stand;Stand to Sit Sit to Stand: 4: Min assist;With upper extremity assist;With armrests;From chair/3-in-1 Stand to Sit: 4: Min assist;With upper extremity assist;With armrests;To chair/3-in-1 Details for Transfer Assistance: max directional verbal cues for walker safety, hand placement and LE management   Exercise Total Joint Exercises Ankle Circles/Pumps: AROM;Both;10 reps;Supine Quad Sets: Right;10 reps;Supine;AROM Heel Slides: AAROM;Right;10 reps;Supine  Balance    End of Session OT - End of Session Activity Tolerance:  (limited by assisted fall to the floor) Patient left: in bed;with call bell/phone within reach Nurse Communication:  (nursing aware of fall)  GO     Evette Georges  [phone] 04/07/2012, 4:17 PM

## 2012-04-07 NOTE — Progress Notes (Signed)
Physical Therapy Treatment Note   04/07/12 1328  PT Visit Information  Last PT Received On 04/07/12  Assistance Needed +2 (due to pt with report of fall this AM with OT)  PT Time Calculation  PT Start Time 1328  PT Stop Time 1349  PT Time Calculation (min) 21 min  Subjective Data  Subjective Pt received supine in bed with report of fall with OT this AM. Pt with request to use BSC. Spoke with RN who agreed to stand pvt transfer to [facility] but not amb due to awaiting x-ray report.  Precautions  Precautions Knee  Required Braces or Orthoses Knee Immobilizer - Right  Knee Immobilizer - Right Discontinue post op day 2;On when out of bed or walking  Restrictions  RLE Weight Bearing WBAT  Cognition  Overall Cognitive Status Appears within functional limits for tasks assessed/performed  Arousal/Alertness Awake/alert  Orientation Level Oriented X4 / Intact  Behavior During Session [facility] for tasks performed  Bed Mobility  Bed Mobility Supine to Sit  Supine to Sit HOB flat;4: Min assist  Details for Bed Mobility Assistance increased time, labored effort, request HOB elevated however instructed patient she will not have a hospital bed at home, pt agreed.  Transfers  Transfers Sit to Stand;Stand to [employer] Transfers  Sit to Stand 4: Min assist;With upper extremity assist;From bed  Stand to Sit 4: Min assist;With upper extremity assist;To chair/3-in-1;With armrests  Stand Pivot Transfers 4: Min assist (with RW.)  Details for Transfer Assistance max directional verbal cues for walker safety, hand placement and LE management  Exercises  Exercises Total Joint  Total Joint Exercises  Ankle Circles/Pumps AROM;Both;10 reps;Supine  Quad Sets Right;10 reps;Supine;AROM  Heel Slides AAROM;Right;10 reps;Supine  PT - End of Session  Equipment Utilized During Treatment Gait belt;Right knee immobilizer  Activity Tolerance (limited by awaiting x-ray results)  Patient left in bed;with call bell/phone  within reach  PT - Assessment/Plan  Comments on Treatment Session Pt session limited by awaiting x-ray results. Patient reports the knee to be more painful but overall okay.. Pt tolerated R LE WBing well with R KI for transfer.  PT Plan Discharge plan remains appropriate;Frequency remains appropriate  PT Frequency 7X/week  Follow Up Recommendations Home health PT;Supervision/Assistance - 24 hour (may need snf placement if pt doesnt progress due to patient with no assist during the day)  Equipment Recommended None recommended by PT  Acute Rehab PT Goals  PT Goal: Supine/Side to Sit - Progress Progressing toward goal  PT Goal: Sit to Supine/Side - Progress Progressing toward goal  PT Goal: Sit to Stand - Progress Progressing toward goal  PT Goal: Perform Home Exercise Program - Progress Progressing toward goal  PT General Charges  $$ ACUTE PT VISIT 1 Procedure  PT Treatments  $Therapeutic Activity 8-22 mins    Pain: 7/10 R knee pain  Lewis Shock, PT, DPT Pager #: [phone] Office #: [phone]

## 2012-04-07 NOTE — Progress Notes (Signed)
Patient ID: [name], female   DOB: Dec 01, 1960, 51 y.o.   MRN: [id number] PATIENT ID: [name]  MRN: [id number]  DOB/AGE:  [birth year] / 51 y.o.  1 Day Post-Op Procedure(s) (LRB): TOTAL KNEE ARTHROPLASTY (Right)    PROGRESS NOTE Subjective: Patient is alert, oriented, no Nausea, no Vomiting, yes passing gas, no Bowel Movement. Taking PO well. Denies SOB, Chest or Calf Pain. Using Incentive Spirometer, PAS in place. Ambulate not yet, CPM 0-90 Patient reports pain as 4 on 0-10 scale  .    Objective: Vital signs in last 24 hours: Filed Vitals:   04/06/12 1230 04/06/12 1320 04/06/12 2258 04/07/12 0601  BP:  151/87 126/71 111/72  Pulse:  86 95 91  Temp: 98 F (36.7 C) 98.1 F (36.7 C) 97.3 F (36.3 C) 98.8 F (37.1 C)  TempSrc:  Oral Oral Oral  Resp:  18 18 18   SpO2:  98% 100% 100%      Intake/Output from previous day: I/O last 3 completed shifts: In: 1600 [I.V.:1600] Out: 3420 [Urine:3000; Drains:420]   Intake/Output this shift:     LABORATORY DATA:  Basename 04/07/12 0420 04/06/12 0605  WBC 15.3* 15.0*  HGB 9.5* 11.8*  HCT 30.0* 37.2  PLT 286 346  NA 137 140  K 3.6 3.5  CL 102 102  CO2 27 23  BUN 11 13  CREATININE 0.67 0.68  GLUCOSE 118* 102*  GLUCAP -- --  INR -- --  CALCIUM 8.8 --    Examination: Neurologically intact ABD soft Neurovascular intact Sensation intact distally Intact pulses distally Dorsiflexion/Plantar flexion intact Incision: dressing C/D/I}  Assessment:   1 Day Post-Op Procedure(s) (LRB): TOTAL KNEE ARTHROPLASTY (Right) ADDITIONAL DIAGNOSIS:  Acute Blood Loss Anemia Patient Active Problem List  Diagnosis  . Lumbar degenerative disc disease  . Fibromyalgia  . Joint pain  . GERD (gastroesophageal reflux disease)  . Anxiety  . Depression  . Hypertension  . Left knee DJD  . Hypokalemia  . Postoperative anemia due to acute blood loss  . Right knee DJD  . 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 Leukocytosis   Plan: PT/OT WBAT, CPM 6/hrs dayl ROM 0-90 degrees DVT Prophylaxis:  SCDx72hrs, Lovenox DISCHARGE PLAN: Home DISCHARGE NEEDS: HHRN Fluid bolus times 2 dressing change tonight    Suleyma Wafer J 04/07/2012, 8:18 AM

## 2012-04-07 NOTE — Progress Notes (Signed)
Patient ID: [name], female   DOB: September 19, 1961, 51 y.o.   MRN: [id number] [name]   Called from the floor.  Patient has just fallen.  Now having new popping in her right knee.  1 day post op.  Will order knee Xrays.  Jill Drawdy A. Gwinda Passe Physician Assistant [facility] [phone]  04/07/2012, 10:52 AM

## 2012-04-08 ENCOUNTER — Encounter (HOSPITAL_COMMUNITY): Payer: Self-pay | Admitting: *Deleted

## 2012-04-08 ENCOUNTER — Other Ambulatory Visit: Payer: Self-pay | Admitting: Orthopedic Surgery

## 2012-04-08 ENCOUNTER — Inpatient Hospital Stay (HOSPITAL_COMMUNITY)
Admission: EM | Admit: 2012-04-08 | Discharge: 2012-04-11 | Disposition: A | Discharge status: Home Health | Payer: Medicare Other | Source: Home / Self Care | Attending: Orthopedic Surgery | Admitting: Orthopedic Surgery

## 2012-04-08 DIAGNOSIS — F329 Major depressive disorder, single episode, unspecified: Secondary | ICD-10-CM | POA: Diagnosis present

## 2012-04-08 DIAGNOSIS — Z87891 Personal history of nicotine dependence: Secondary | ICD-10-CM

## 2012-04-08 DIAGNOSIS — Y831 Surgical operation with implant of artificial internal device as the cause of abnormal reaction of the patient, or of later complication, without mention of misadventure at the time of the procedure: Secondary | ICD-10-CM | POA: Diagnosis present

## 2012-04-08 DIAGNOSIS — T81329A Deep disruption or dehiscence of operation wound, unspecified, initial encounter: Secondary | ICD-10-CM | POA: Diagnosis present

## 2012-04-08 DIAGNOSIS — Z91040 Latex allergy status: Secondary | ICD-10-CM

## 2012-04-08 DIAGNOSIS — I10 Essential (primary) hypertension: Secondary | ICD-10-CM | POA: Diagnosis present

## 2012-04-08 DIAGNOSIS — F3289 Other specified depressive episodes: Secondary | ICD-10-CM | POA: Diagnosis present

## 2012-04-08 DIAGNOSIS — W1809XA Striking against other object with subsequent fall, initial encounter: Secondary | ICD-10-CM | POA: Diagnosis present

## 2012-04-08 DIAGNOSIS — T148XXA Other injury of unspecified body region, initial encounter: Secondary | ICD-10-CM

## 2012-04-08 DIAGNOSIS — M1710 Unilateral primary osteoarthritis, unspecified knee: Secondary | ICD-10-CM | POA: Diagnosis present

## 2012-04-08 DIAGNOSIS — K589 Irritable bowel syndrome without diarrhea: Secondary | ICD-10-CM | POA: Diagnosis present

## 2012-04-08 DIAGNOSIS — Z981 Arthrodesis status: Secondary | ICD-10-CM

## 2012-04-08 DIAGNOSIS — G4700 Insomnia, unspecified: Secondary | ICD-10-CM | POA: Diagnosis present

## 2012-04-08 DIAGNOSIS — K219 Gastro-esophageal reflux disease without esophagitis: Secondary | ICD-10-CM | POA: Diagnosis present

## 2012-04-08 DIAGNOSIS — Z823 Family history of stroke: Secondary | ICD-10-CM

## 2012-04-08 DIAGNOSIS — Y92009 Unspecified place in unspecified non-institutional (private) residence as the place of occurrence of the external cause: Secondary | ICD-10-CM

## 2012-04-08 DIAGNOSIS — F411 Generalized anxiety disorder: Secondary | ICD-10-CM | POA: Diagnosis present

## 2012-04-08 DIAGNOSIS — Z91013 Allergy to seafood: Secondary | ICD-10-CM

## 2012-04-08 DIAGNOSIS — M549 Dorsalgia, unspecified: Secondary | ICD-10-CM | POA: Diagnosis present

## 2012-04-08 DIAGNOSIS — T8132XA Disruption of internal operation (surgical) wound, not elsewhere classified, initial encounter: Secondary | ICD-10-CM | POA: Diagnosis present

## 2012-04-08 DIAGNOSIS — Z96659 Presence of unspecified artificial knee joint: Secondary | ICD-10-CM

## 2012-04-08 DIAGNOSIS — W010XXA Fall on same level from slipping, tripping and stumbling without subsequent striking against object, initial encounter: Secondary | ICD-10-CM

## 2012-04-08 DIAGNOSIS — G8929 Other chronic pain: Secondary | ICD-10-CM | POA: Diagnosis present

## 2012-04-08 DIAGNOSIS — T8131XA Disruption of external operation (surgical) wound, not elsewhere classified, initial encounter: Secondary | ICD-10-CM | POA: Diagnosis present

## 2012-04-08 DIAGNOSIS — S86909A Unspecified injury of unspecified muscle(s) and tendon(s) at lower leg level, unspecified leg, initial encounter: Secondary | ICD-10-CM | POA: Diagnosis present

## 2012-04-08 DIAGNOSIS — J45909 Unspecified asthma, uncomplicated: Secondary | ICD-10-CM | POA: Diagnosis present

## 2012-04-08 LAB — CBC
HCT: 30.3 % — ABNORMAL LOW (ref 36.0–46.0)
HCT: 29.4 % — ABNORMAL LOW (ref 36.0–46.0)
Hemoglobin: 9.7 g/dL — ABNORMAL LOW (ref 12.0–15.0)
Hemoglobin: 9.3 g/dL — ABNORMAL LOW (ref 12.0–15.0)
MCH: 24.5 pg — ABNORMAL LOW (ref 26.0–34.0)
MCH: 23.9 pg — ABNORMAL LOW (ref 26.0–34.0)
MCHC: 32 g/dL (ref 30.0–36.0)
MCHC: 31.6 g/dL (ref 30.0–36.0)
MCV: 76.5 fL — ABNORMAL LOW (ref 78.0–100.0)
MCV: 75.6 fL — ABNORMAL LOW (ref 78.0–100.0)
Platelets: 291 10*3/uL (ref 150–400)
Platelets: 303 10*3/uL (ref 150–400)
RBC: 3.96 MIL/uL (ref 3.87–5.11)
RBC: 3.89 MIL/uL (ref 3.87–5.11)
RDW: 17.3 % — ABNORMAL HIGH (ref 11.5–15.5)
RDW: 17.3 % — ABNORMAL HIGH (ref 11.5–15.5)
WBC: 16.8 10*3/uL — ABNORMAL HIGH (ref 4.0–10.5)
WBC: 17.3 10*3/uL — ABNORMAL HIGH (ref 4.0–10.5)

## 2012-04-08 LAB — BASIC METABOLIC PANEL
BUN: 8 mg/dL (ref 6–23)
CO2: 23 mEq/L (ref 19–32)
Calcium: 8.9 mg/dL (ref 8.4–10.5)
Chloride: 105 mEq/L (ref 96–112)
Creatinine, Ser: 0.52 mg/dL (ref 0.50–1.10)
GFR calc Af Amer: >90 mL/min (ref >90)
GFR calc non Af Amer: >90 mL/min (ref >90)
Glucose, Bld: 109 mg/dL — ABNORMAL HIGH (ref 70–99)
Potassium: 4 mEq/L (ref 3.5–5.1)
Sodium: 140 mEq/L (ref 135–145)

## 2012-04-08 MED ORDER — OXYCODONE HCL 20 MG PO TB12: 20.0000 mg | ORAL_TABLET | Freq: Two times a day (BID) | ORAL | Status: DC

## 2012-04-08 MED ORDER — ENOXAPARIN SODIUM 30 MG/0.3ML ~~LOC~~ SOLN: 30.0000 mg | Freq: Two times a day (BID) | SUBCUTANEOUS | Status: DC

## 2012-04-08 MED ORDER — OXYCODONE HCL 5 MG PO TABS: ORAL_TABLET | ORAL | Status: DC

## 2012-04-08 MED ORDER — CELECOXIB 200 MG PO CAPS: 200.0000 mg | ORAL_CAPSULE | Freq: Every day | ORAL | Status: DC

## 2012-04-08 MED ORDER — SODIUM CHLORIDE 0.9 % IV SOLN
INTRAVENOUS | Status: DC
  Administered 2012-04-08: 125 mL/h via INTRAVENOUS

## 2012-04-08 MED ORDER — MOXIFLOXACIN HCL 400 MG PO TABS: 400.0000 mg | ORAL_TABLET | Freq: Every day | ORAL | Status: DC

## 2012-04-08 NOTE — Progress Notes (Signed)
Occupational Therapy Treatment Patient Details Name: [name] MRN: [id number] DOB: May 06, 1961 Today's Date: 04/08/2012 Time: 4098-1191 OT Time Calculation (min): 8 min  OT Assessment / Plan / Recommendation Comments on Treatment Session Pt making progress, with HHOT she should be able to get to an I/Mod I level.    Follow Up Recommendations  Home health OT    Barriers to Discharge       Equipment Recommendations  None recommended by OT    Recommendations for Other Services    Frequency Min 2X/week   Plan Discharge plan remains appropriate    Precautions / Restrictions Precautions Precautions: Knee Required Braces or Orthoses: Knee Immobilizer - Right Knee Immobilizer - Right:  (suppose to D/C on day 2, but advised pt to keep wearing it s) Restrictions Weight Bearing Restrictions: Yes RLE Weight Bearing: Weight bearing as tolerated   Pertinent Vitals/Pain     ADL  ADL Comments: Pt reports someone will be with her anytime she needs someone. She is aware she should wear her knee immobilizer anytime she is up walking. Knee immoblizer was rotated so I showed her how it was suppose to be on and how to move the side metal stays if she need to. Pt reports that she got herself dressed this AM for D/C.    OT Diagnosis:    OT Problem List:   OT Treatment Interventions:     OT Goals ADL Goals ADL Goal: Lower Body Dressing - Progress: Met (per pt report)  Visit Information  Last OT Received On: 04/08/12 Assistance Needed: +1    Subjective Data  Subjective: Yep, I slid off the bed last night onto my booty and then one other time I started to go down like I did with you but I caught myself from going all the way to the floor.   Prior Functioning       Cognition  Overall Cognitive Status: Appears within functional limits for tasks assessed/performed Arousal/Alertness: Awake/alert Orientation Level: Appears intact for tasks assessed Behavior During Session: [facility] for  tasks performed    Mobility     Exercises    Balance    End of Session OT - End of Session Patient left: in chair;with call bell/phone within reach       Evette Georges [phone] 04/08/2012, 11:01 AM

## 2012-04-08 NOTE — Progress Notes (Addendum)
Physical Therapy Treatment Patient Details Name: [name] MRN: [id number] DOB: 16-Dec-1960 Today's Date: 04/08/2012 Time: 7829-5621 PT Time Calculation (min): 30 min  PT Assessment / Plan / Recommendation Comments on Treatment Session  Pt progressing with PT goals.  Plans are for d/c home today.  Cont'd use of KI today due to pt with 2 falls yesterday- spoke with ortho PA & she is ok with this.  Encouraged pt to use KI until HHPT feels appropriate for d/c of KI.  Pt agreeable.      Follow Up Recommendations  Home health PT;Supervision/Assistance - 24 hour    Barriers to Discharge        Equipment Recommendations  None recommended by OT    Recommendations for Other Services    Frequency 7X/week   Plan Discharge plan remains appropriate;Frequency remains appropriate    Precautions / Restrictions Precautions Precautions: Knee Required Braces or Orthoses: Knee Immobilizer - Right Knee Immobilizer - Right: Discontinue post op day 2;Other (comment) (suppose to D/C on day 2, but advised pt to wearing for safety) Restrictions Weight Bearing Restrictions: Yes RLE Weight Bearing: Weight bearing as tolerated     Pertinent Vitals/Pain 5/10 knee.      Mobility  Bed Mobility Bed Mobility: Supine to Sit Supine to Sit: HOB flat;4: Min assist Details for Bed Mobility Assistance: minor (A) for RLE.  Cues for technique.   Transfers Transfers: Sit to Stand;Stand to Sit Sit to Stand: With upper extremity assist;4: Min guard;With armrests;From bed;From chair/3-in-1 Stand to Sit: 4: Min guard;With upper extremity assist;With armrests;To chair/3-in-1 Details for Transfer Assistance: Cues for hand placement & RLE positioning due to KI.   Ambulation/Gait Ambulation/Gait Assistance: 4: Min guard Ambulation Distance (Feet): 75 Feet Assistive device: Rolling walker Ambulation/Gait Assistance Details: Cues for tall posture.  No knee buckling noted, however pt reports it feels as though it  could buckle with her despite wearing KI.   Gait Pattern: Step-to pattern;Decreased step length - right;Decreased step length - left Gait velocity: slow Stairs: Yes Stairs Assistance: 4: Min guard Stairs Assistance Details (indicate cue type and reason): Max cues for sequencing.   Stair Management Technique: Two rails;Step to pattern;Forwards Number of Stairs: 4  Wheelchair Mobility Wheelchair Mobility: No    Exercises Total Joint Exercises Ankle Circles/Pumps: AROM;Both;15 reps Straight Leg Raises: AROM;Right;10 reps   PT Diagnosis:    PT Problem List:   PT Treatment Interventions:     PT Goals Acute Rehab PT Goals Time For Goal Achievement: 04/14/12 Potential to Achieve Goals: Good PT Goal: Supine/Side to Sit - Progress: Progressing toward goal PT Goal: Sit to Stand - Progress: Progressing toward goal PT Goal: Ambulate - Progress: Progressing toward goal PT Goal: Up/Down Stairs - Progress: Progressing toward goal  Visit Information  Last PT Received On: 04/08/12 Assistance Needed: +1    Subjective Data      Cognition  Overall Cognitive Status: Appears within functional limits for tasks assessed/performed Arousal/Alertness: Awake/alert Orientation Level: Appears intact for tasks assessed Behavior During Session: [facility] for tasks performed    Balance  Balance Balance Assessed: No  End of Session PT - End of Session Equipment Utilized During Treatment: Gait belt;Right knee immobilizer Activity Tolerance: Patient tolerated treatment well;Patient limited by pain Patient left: in chair;with call bell/phone within reach Nurse Communication: Mobility status   GP     Lara Mulch 04/08/2012, 11:22 AM   Verdell Face, PTA [phone] 04/08/2012

## 2012-04-08 NOTE — ED Provider Notes (Signed)
History     CSN: [id number]  Arrival date & time 04/08/12  2235   First MD Initiated Contact with Patient 04/08/12 2308      Chief Complaint  Patient presents with  . Wound Check    (Consider location/radiation/quality/duration/timing/severity/associated sxs/prior treatment) HPI R knee pain. Total R knee 04-06-12. DR Thurston Hole. Had inpatient PT yesterday, twisted her knee while ambulating. Felt a pop, had an xray, told it was OK. Sent home today and concerned because wound is opening up.  PA for DR Thurston Hole did see the wound prior to discharge and recommended dressings with wound instructions - keep wound clean and dry. Tonight elevated concern over the wound. Has had some bleeding. Is scheduled follow up in 5 days.  No F/C. No wound redness or drainage. Pain is improved with rest, hurst to walk. PT had total L knee surgery 01/27/12. MOD in severity, no radiation of sharp pain. Sent here by Randon Goldsmith when she called.  Past Medical History  Diagnosis Date  . Bronchitis   . Jones fracture     left foot fifth metatarsal  . Fibromyalgia   . Varicose vein     protrudes above skin-per pt;vein popped and bruised;ultrasound done to make sure that there were no clots;noclots were found  . Chronic back pain   . Multiple allergies   . Asthma     but pt states not severe enough to even have an inhaler  . Shortness of breath     with exertion;pt states that its related to her weight  . Hyperthyroidism     but pt doesn't take anything for it;thyroid nodules  . Headache   . Dizziness     rarely  . Impaired memory   . Joint pain   . Joint swelling   . GERD (gastroesophageal reflux disease)     doesn't take anything for it  . IBS (irritable bowel syndrome)   . Arthritis     bilateral knees  . Bruising     pt states unexplained d/t fibromyalgia  . Urinary frequency   . Increased thirst   . Anemia   . Anxiety     takes Ativan and Valium  . Depression     from Fibromyalgia diagnosis  .  Insomnia     takes Ambien  . Sore gums     this is why pt is on Amoxil-only takes for dental work  . Hypertension     recently diagnosed (this month)  . Left knee DJD   . Tachycardia     Past Surgical History  Procedure Date  . Cesarean section 1984  . Spine surgery 2004    Cervical plate, ACDF  . Knee surgery 2005    Left knee arthroscopy  . Nasal septoplasty w/ turbinoplasty 2007  . Tonsillectomy 2007  . Fracture surgery 05/08/2010    Jones fracture left foot fifth metatarsal  . Anterior lumbar fusion 09/20/2011    Procedure: ANTERIOR LUMBAR FUSION 1 LEVEL;  Surgeon: Carmela Hurt;  Location: [facility];  Service: Neurosurgery;  Laterality: N/A;  Lumbar five-Sacral One Anterior Lumbar Interbody Fusion /Dr. Early to Approach   . Cervical disc surgery     WITH TITANIUM PLATE IN NECK---LEFT SIDE  . Abdominal hysterectomy     partial hysterectomy -mid 2000's,   . Total knee arthroplasty 01/27/2012    Procedure: TOTAL KNEE ARTHROPLASTY;  Surgeon: Nilda Simmer, MD;  Location: [facility] OR;  Service: Orthopedics;  Laterality: Left;  DR  WAINER WANTS 90 MINUTES FOR THIS CASE  . Steriod injection 01/27/2012    Procedure: STEROID INJECTION;  Surgeon: Nilda Simmer, MD;  Location: [facility] OR;  Service: Orthopedics;  Laterality: Right;  . Joint replacement 01-27-2012    left total knee  . Total knee arthroplasty 04/06/2012    Procedure: TOTAL KNEE ARTHROPLASTY;  Surgeon: Nilda Simmer, MD;  Location: [facility] OR;  Service: Orthopedics;  Laterality: Right;    Family History  Problem Relation Age of Onset  . Stroke Father   . Anesthesia problems Neg Hx   . Hypotension Neg Hx   . Malignant hyperthermia Neg Hx   . Pseudochol deficiency Neg Hx   . Arthritis Sister     History  Substance Use Topics  . Smoking status: Former Smoker -- 0.5 packs/day for 10 years    Quit date: 12/06/1998  . Smokeless tobacco: Never Used  . Alcohol Use: No    OB History    Grav Para Term Preterm Abortions TAB SAB  Ect Mult Living                  Review of Systems  Constitutional: Negative for fever and chills.  HENT: Negative for neck pain and neck stiffness.   Eyes: Negative for pain.  Respiratory: Negative for shortness of breath.   Cardiovascular: Negative for chest pain.  Gastrointestinal: Negative for abdominal pain.  Genitourinary: Negative for dysuria.  Musculoskeletal: Negative for back pain.  Skin: Positive for wound. Negative for rash.  Neurological: Negative for headaches.  All other systems reviewed and are negative.    Allergies  Shellfish allergy; Latex; and Other  Home Medications   Current Outpatient Rx  Name Route Sig Dispense Refill  . CALCIUM CARBONATE-VITAMIN D 500-200 MG-UNIT PO TABS Oral Take 1 tablet by mouth daily.    . CELECOXIB 200 MG PO CAPS Oral Take 1 capsule (200 mg total) by mouth daily. 30 capsule 3  . CYCLOBENZAPRINE HCL 10 MG PO TABS Oral Take 10 mg by mouth 3 (three) times daily.      Marland Kitchen DICLOFENAC SODIUM 1 % TD GEL Topical Apply 1 application topically 2 (two) times daily as needed. For right shoulder and arm and legs    . BENADRYL PO Oral Take 2 capsules by mouth 1 day or 1 dose. In case she comes in contact with shellfish or shrimp    . STOOL SOFTENER PO Oral Take 1 tablet by mouth daily.    Marland Kitchen ENOXAPARIN SODIUM 30 MG/0.3ML [place] SOLN Subcutaneous Inject 0.3 mLs (30 mg total) into the skin every 12 (twelve) hours. 22 Syringe 0  . GABAPENTIN 300 MG PO CAPS Oral Take 300 mg by mouth 3 (three) times daily.      Marland Kitchen HYDROCHLOROTHIAZIDE 25 MG PO TABS Oral Take 25 mg by mouth daily. One tablet by mouth in the morning.     Marland Kitchen LORAZEPAM 1 MG PO TABS Oral Take 1 mg by mouth 2 (two) times daily as needed. For anxiety attacks    . MOXIFLOXACIN HCL 400 MG PO TABS Oral Take 1 tablet (400 mg total) by mouth daily. 14 tablet 0  . OXYCODONE HCL 5 MG PO TABS  1-2 tab every 4-6 hrs as needed for breakthrough pain 100 tablet 0  . OXYCODONE HCL ER 20 MG PO TB12 Oral Take 1  tablet (20 mg total) by mouth every 12 (twelve) hours. 30 tablet 0  . POTASSIUM CHLORIDE CRYS ER 10 MEQ PO TBCR Oral Take  10 mEq by mouth 2 (two) times daily.    Marland Kitchen ZOLPIDEM TARTRATE 10 MG PO TABS Oral Take 10 mg by mouth at bedtime as needed. For sleep      BP 142/84  Pulse 88  Temp 98 F (36.7 C) (Oral)  Resp 18  SpO2 99%  LMP 02/10/2012  Physical Exam  Constitutional: She is oriented to person, place, and time. She appears well-developed and well-nourished.  HENT:  Head: Normocephalic and atraumatic.  Eyes: Conjunctivae and EOM are normal. Pupils are equal, round, and reactive to light.  Neck: Trachea normal. Neck supple. No thyromegaly present.  Cardiovascular: Normal rate, regular rhythm, S1 normal, S2 normal and normal pulses.     No systolic murmur is present   No diastolic murmur is present  Pulses:      Radial pulses are 2+ on the right side, and 2+ on the left side.  Pulmonary/Chest: Effort normal and breath sounds normal. She has no wheezes. She has no rhonchi. She has no rales. She exhibits no tenderness.  Abdominal: Soft. Normal appearance and bowel sounds are normal. There is no tenderness. There is no CVA tenderness and negative Murphy's sign.  Musculoskeletal:       LLE: knee wound with about 8cm of open wound with packing in place, no surrounding erythema. No purulent d/c or active bleeding. Post surgical chnages with swelling present with distal N/V intact.  Neurological: She is alert and oriented to person, place, and time. She has normal strength. No cranial nerve deficit or sensory deficit. GCS eye subscore is 4. GCS verbal subscore is 5. GCS motor subscore is 6.  Skin: Skin is warm and dry. No rash noted. She is not diaphoretic.  Psychiatric: Her speech is normal.       Cooperative and appropriate    ED Course  Procedures (including critical care time)  Labs Reviewed - No data to display Dg Knee 4 Views W/patella Right  04/07/2012  *RADIOLOGY REPORT*   Clinical Data: Recent right total knee arthroplasty.  Twisted knee today wall ambulating.  RIGHT KNEE - COMPLETE 4+ VIEW  Comparison: None.  Findings: Postoperative changes of recent right total knee arthroplasty.  Expected postsurgical changes in the soft tissues. Medial and lateral compartment joint spaces appear preserved. Polyethylene liner cannot be well visualized on the lateral view due to overlapping densities.  Based on the frontal view, it is likely intact and located.  Patella appears appropriately located.  IMPRESSION: Right total knee arthroplasty with expected postoperative changes in the soft tissues.  No complicating features.  Original Report Authenticated By: Andreas Newport, M.D.   NPO.   11:40 PM d/w DR Cleophas Dunker on call for [facility] and PT is to be admitted. Plan OR at 0830 am   MDM   Open L knee surgical wound plan OR in am with Dr Thurston Hole. PT admitted.         Sunnie Nielsen, MD 04/08/12 [phone]

## 2012-04-08 NOTE — ED Notes (Addendum)
Pt was seen at [facility] where she was told to come to ED to be further evaluated

## 2012-04-08 NOTE — Discharge Summary (Signed)
Patient ID: [name] MRN: [id number] DOB/AGE: 1961/07/19 51 y.o.  Admit date: 04/06/2012 Discharge date: 04/08/2012  Admission Diagnoses:  Principal Problem:  *Right knee DJD Active Problems:  Lumbar degenerative disc disease  Fibromyalgia  Joint pain  GERD (gastroesophageal reflux disease)  Anxiety  Depression  Hypertension  Postoperative anemia due to acute blood loss  Status post left total prosthetic replacement of knee joint using cement  Leukocytosis  Fall on same level from slipping, tripping or stumbling   Discharge Diagnoses:  Same  Past Medical History  Diagnosis Date  . Bronchitis   . Jones fracture     left foot fifth metatarsal  . Fibromyalgia   . Varicose vein     protrudes above skin-per pt;vein popped and bruised;ultrasound done to make sure that there were no clots;noclots were found  . Chronic back pain   . Multiple allergies   . Asthma     but pt states not severe enough to even have an inhaler  . Shortness of breath     with exertion;pt states that its related to her weight  . Hyperthyroidism     but pt doesn't take anything for it;thyroid nodules  . Headache   . Dizziness     rarely  . Impaired memory   . Joint pain   . Joint swelling   . GERD (gastroesophageal reflux disease)     doesn't take anything for it  . IBS (irritable bowel syndrome)   . Arthritis     bilateral knees  . Bruising     pt states unexplained d/t fibromyalgia  . Urinary frequency   . Increased thirst   . Anemia   . Anxiety     takes Ativan and Valium  . Depression     from Fibromyalgia diagnosis  . Insomnia     takes Ambien  . Sore gums     this is why pt is on Amoxil-only takes for dental work  . Hypertension     recently diagnosed (this month)  . Left knee DJD   . Tachycardia     Surgeries: Procedure(s): TOTAL KNEE ARTHROPLASTY on 04/06/2012   Consultants:    Discharged Condition: Improved  Hospital Course: [name] is an 51 y.o.  female who was admitted 04/06/2012 for operative treatment ofRight knee DJD. Patient has severe unremitting pain that affects sleep, daily activities, and work/hobbies. After pre-op clearance the patient was taken to the operating room on 04/06/2012 and underwent  Procedure(s): TOTAL KNEE ARTHROPLASTY.    Patient was given perioperative antibiotics: Anti-infectives     Start     Dose/Rate Route Frequency Ordered Stop   04/08/12 0000   moxifloxacin (AVELOX) 400 MG tablet        400 mg Oral Daily 04/08/12 0656 04/18/12 2359   04/06/12 1430   ceFAZolin (ANCEF) IVPB 2 g/50 mL premix        2 g 100 mL/hr over 30 Minutes Intravenous Every 6 hours 04/06/12 1336 04/06/12 2047   04/06/12 0757   cefUROXime (ZINACEF) injection  Status:  Discontinued          As needed 04/06/12 0757 04/06/12 0934   04/05/12 1538   ceFAZolin (ANCEF) IVPB 2 g/50 mL premix        2 g 100 mL/hr over 30 Minutes Intravenous 60 min pre-op 04/05/12 1538 04/06/12 0726           Patient was given sequential compression devices, early ambulation, and chemoprophylaxis to prevent  DVT.  On 04-06-2012.  Patient had 3 witnessed and supervised falls.  The first one was working with OT.  The red safety socks got caught on the floor as she was exiting the bathroom.  The second 2 falls were getting out of bed to the bedside commode.  X-rays taken after the first fall show no abnormality.  The next two falls her leg gave way and she slid on her backside.  Only complaints of back pain.  This am she is able to do a straight leg raise with no extension lag.  She has excellent quad control.  Her wound had open less than a centimeter.  The wound was steri stripped together.  She repeatedly gets up without calling for assistance.  Moderate drainage from would today.  I swabbed her wound with chlorhexidine and dressed with dry guaze.   Patient benefited maximally from hospital stay and there were no complications other than the falls described  above.    Recent vital signs:  Patient Vitals for the past 24 hrs:  BP Temp Pulse Resp SpO2  04/08/12 0628 145/85 mmHg 97.7 F (36.5 C) 101  18  100 %  04/08/12 0215 138/80 mmHg 98.3 F (36.8 C) 92  18  100 %  04/08/12 0000 - - - 18  99 %  04/07/12 2215 168/91 mmHg 97.3 F (36.3 C) 90  18  100 %  04/07/12 2150 143/78 mmHg 97.7 F (36.5 C) 95  18  100 %  04/07/12 2015 140/96 mmHg 98.7 F (37.1 C) 106  18  97 %  04/07/12 1600 - - - 18  97 %  04/07/12 1400 132/72 mmHg 98.5 F (36.9 C) 99  16  98 %  04/07/12 1200 - - - 18  97 %  04/07/12 0800 - - - 16  98 %     Recent laboratory studies:   Basename 04/08/12 0500 04/07/12 0420  WBC 16.8* 15.3*  HGB 9.7* 9.5*  HCT 30.3* 30.0*  PLT 291 286  NA 140 137  K 4.0 3.6  CL 105 102  CO2 23 27  BUN 8 11  CREATININE 0.52 0.67  GLUCOSE 109* 118*  INR -- --  CALCIUM 8.9 --     Discharge Medications:   Medication List  As of 04/08/2012  7:20 AM   STOP taking these medications         oxyCODONE-acetaminophen 5-325 MG per tablet         TAKE these medications         ATIVAN 1 MG tablet   Generic drug: LORazepam   Take 1 mg by mouth 2 (two) times daily as needed. For anxiety attacks      BENADRYL PO   Take 2 capsules by mouth 1 day or 1 dose. In case she comes in contact with shellfish or shrimp      calcium-vitamin D 500-200 MG-UNIT per tablet   Commonly known as: OSCAL WITH D   Take 1 tablet by mouth daily.      celecoxib 200 MG capsule   Commonly known as: CELEBREX   Take 1 capsule (200 mg total) by mouth daily.      cyclobenzaprine 10 MG tablet   Commonly known as: FLEXERIL   Take 10 mg by mouth 3 (three) times daily.      diclofenac sodium 1 % Gel   Commonly known as: VOLTAREN   Apply 1 application topically 2 (two) times daily as needed. For right  shoulder and arm and legs      enoxaparin 30 MG/0.3ML injection   Commonly known as: LOVENOX   Inject 0.3 mLs (30 mg total) into the skin every 12 (twelve) hours.        gabapentin 300 MG capsule   Commonly known as: NEURONTIN   Take 300 mg by mouth 3 (three) times daily.      hydrochlorothiazide 25 MG tablet   Commonly known as: HYDRODIURIL   Take 25 mg by mouth daily. One tablet by mouth in the morning.      moxifloxacin 400 MG tablet   Commonly known as: AVELOX   Take 1 tablet (400 mg total) by mouth daily.      oxyCODONE 5 MG immediate release tablet   Commonly known as: Oxy IR/ROXICODONE   1-2 tab every 4-6 hrs as needed for breakthrough pain      oxyCODONE 20 MG 12 hr tablet   Commonly known as: OXYCONTIN   Take 1 tablet (20 mg total) by mouth every 12 (twelve) hours.      potassium chloride 10 MEQ tablet   Commonly known as: K-DUR,KLOR-CON   Take 10 mEq by mouth 2 (two) times daily.      STOOL SOFTENER PO   Take 1 tablet by mouth daily.      zolpidem 10 MG tablet   Commonly known as: AMBIEN   Take 10 mg by mouth at bedtime as needed. For sleep            Diagnostic Studies: Dg Chest 2 View  03/31/2012  *RADIOLOGY REPORT*  Clinical Data: Total knee arthroplasty.  Preop.  Asthma.  Shortness of breath.  Hypertension.  CHEST - 2 VIEW  Comparison: 09/20/2011  Findings: Lateral view degraded by patient arm position.  Lower cervical spine fixation. Midline trachea.  Normal heart size and mediastinal contours.  Minimal convex right thoracic spine curvature. Clear lungs.  IMPRESSION: No acute cardiopulmonary disease.  Original Report Authenticated By: Consuello Bossier, M.D.   Dg Knee 4 Views W/patella Right  04/07/2012  *RADIOLOGY REPORT*  Clinical Data: Recent right total knee arthroplasty.  Twisted knee today wall ambulating.  RIGHT KNEE - COMPLETE 4+ VIEW  Comparison: None.  Findings: Postoperative changes of recent right total knee arthroplasty.  Expected postsurgical changes in the soft tissues. Medial and lateral compartment joint spaces appear preserved. Polyethylene liner cannot be well visualized on the lateral view due to  overlapping densities.  Based on the frontal view, it is likely intact and located.  Patella appears appropriately located.  IMPRESSION: Right total knee arthroplasty with expected postoperative changes in the soft tissues.  No complicating features.  Original Report Authenticated By: Andreas Newport, M.D.    Disposition: 06-Home-Health Care Svc  Discharge Orders    Future Orders Please Complete By Expires   Diet - low sodium heart healthy      Call MD / Call 911      Comments:   If you experience chest pain or shortness of breath, CALL 911 and be transported to the hospital emergency room.  If you develope a fever above 101 F, pus (white drainage) or increased drainage or redness at the wound, or calf pain, call your surgeon's office.   Constipation Prevention      Comments:   Drink plenty of fluids.  Prune juice may be helpful.  You may use a stool softener, such as Colace (over the counter) 100 mg twice a day.  Use MiraLax (over  the counter) for constipation as needed.   Increase activity slowly as tolerated      Discharge instructions      Comments:   Total Knee Replacement Care After Refer to this sheet in the next few weeks. These discharge instructions provide you with general information on caring for yourself after you leave the hospital. Your caregiver may also give you specific instructions. Your treatment has been planned according to the most current medical practices available, but unavoidable complications sometimes occur. If you have any problems or questions after discharge, please call your caregiver. Regaining a near full range of motion of your knee within the first 3 to 6 weeks after surgery is critical. HOME CARE INSTRUCTIONS  You may resume a normal diet and activities as directed.  Perform exercises as directed.  Place yellow foam block, yellow side up under heel at all times except when in CPM or when walking.  DO NOT modify, tear, cut, or change in any way. You will  receive physical therapy daily  Take showers instead of baths until informed otherwise.  Change bandages (dressings)daily Do not take over-the-counter or prescription medicines for pain, discomfort, or fever. Eat a well-balanced diet.  Avoid lifting or driving until you are instructed otherwise.  Make an appointment to see your caregiver for stitches (suture) or staple removal as directed.  If you have been sent home with a continuous passive motion machine (CPM machine), 0-90 degrees 6 hrs a day   2 hrs a shift SEEK MEDICAL CARE IF: You have swelling of your calf or leg.  You develop shortness of breath or chest pain.  You have redness, swelling, or increasing pain in the wound.  There is pus or any unusual drainage coming from the surgical site.  You notice a bad smell coming from the surgical site or dressing.  The surgical site breaks open after sutures or staples have been removed.  There is persistent bleeding from the suture or staple line.  You are getting worse or are not improving.  You have any other questions or concerns.  SEEK IMMEDIATE MEDICAL CARE IF:  You have a fever.  You develop a rash.  You have difficulty breathing.  You develop any reaction or side effects to medicines given.  Your knee motion is decreasing rather than improving.  MAKE SURE YOU:  Understand these instructions.  Will watch your condition.  Will get help right away if you are not doing well or get worse.   CPM      Comments:   Continuous passive motion machine (CPM):      Use the CPM from 0 to 90 for 6 hours per day.       You may break it up into 2 or 3 sessions per day.      Use CPM for 2 weeks or until you are told to stop.   TED hose      Comments:   Use stockings (TED hose) for 2 weeks on both leg(s).  You may remove them at night for sleeping.   Change dressing      Comments:   Change the dressing daily with sterile 4 x 4 inch gauze dressing and apply TED hose.  You may clean the  incision with alcohol/chlorohexadine prior to redressing.   Do not put a pillow under the knee. Place it under the heel.      Comments:   Place yellow foam block, yellow side up under heel at all times  except when in CPM or when walking.  DO NOT modify, tear, cut, or change in any way the yellow foam block.      Follow-up Information    Follow up with Nilda Simmer, MD on 04/13/2012. (appt time 2 pm)    Contact information:   [facility] [address] [place] [address] [phone]           Signed: Pascal Lux 04/08/2012, 7:20 AM

## 2012-04-08 NOTE — ED Notes (Addendum)
Pt states that she had a total knee replacement in right knee on 7/1.  She states that she tripped yesterday and felt a pop (from her skin).  She states that there is an area on her incision that has opened up and she had this checked at [facility] this pm.  Pt states that she was sent here for admission and procedure in the am per pt and her paperwork.  Knee immobilizer is in place

## 2012-04-09 ENCOUNTER — Encounter (HOSPITAL_COMMUNITY): Payer: Self-pay | Admitting: Certified Registered"

## 2012-04-09 ENCOUNTER — Encounter (HOSPITAL_COMMUNITY)
Admission: EM | Disposition: A | Discharge status: Home Health | Payer: Self-pay | Source: Home / Self Care | Attending: Orthopedic Surgery

## 2012-04-09 ENCOUNTER — Inpatient Hospital Stay (HOSPITAL_COMMUNITY): Payer: Medicare Other | Admitting: Certified Registered"

## 2012-04-09 HISTORY — PX: IRRIGATION AND DEBRIDEMENT KNEE: SHX5185

## 2012-04-09 LAB — COMPREHENSIVE METABOLIC PANEL
ALT: 16 U/L (ref 0–35)
AST: 17 U/L (ref 0–37)
Albumin: 2.7 g/dL — ABNORMAL LOW (ref 3.5–5.2)
Alkaline Phosphatase: 95 U/L (ref 39–117)
BUN: 14 mg/dL (ref 6–23)
CO2: 22 mEq/L (ref 19–32)
Calcium: 8.3 mg/dL — ABNORMAL LOW (ref 8.4–10.5)
Chloride: 109 mEq/L (ref 96–112)
Creatinine, Ser: 0.51 mg/dL (ref 0.50–1.10)
GFR calc Af Amer: >90 mL/min (ref >90)
GFR calc non Af Amer: >90 mL/min (ref >90)
Glucose, Bld: 142 mg/dL — ABNORMAL HIGH (ref 70–99)
Potassium: 3.8 mEq/L (ref 3.5–5.1)
Sodium: 145 mEq/L (ref 135–145)
Total Bilirubin: 0.1 mg/dL — ABNORMAL LOW (ref 0.3–1.2)
Total Protein: 5.9 g/dL — ABNORMAL LOW (ref 6.0–8.3)

## 2012-04-09 LAB — BASIC METABOLIC PANEL WITH GFR
BUN: 13 mg/dL (ref 6–23)
CO2: 22 meq/L (ref 19–32)
Calcium: 8.9 mg/dL (ref 8.4–10.5)
Chloride: 107 meq/L (ref 96–112)
Creatinine, Ser: 0.52 mg/dL (ref 0.50–1.10)
GFR calc Af Amer: >90 mL/min
GFR calc non Af Amer: >90 mL/min
Glucose, Bld: 111 mg/dL — ABNORMAL HIGH (ref 70–99)
Potassium: 4.4 meq/L (ref 3.5–5.1)
Sodium: 143 meq/L (ref 135–145)

## 2012-04-09 LAB — URINE MICROSCOPIC-ADD ON

## 2012-04-09 LAB — CBC
HCT: 28.9 % — ABNORMAL LOW (ref 36.0–46.0)
Hemoglobin: 9 g/dL — ABNORMAL LOW (ref 12.0–15.0)
MCH: 23.7 pg — ABNORMAL LOW (ref 26.0–34.0)
MCHC: 31.1 g/dL (ref 30.0–36.0)
MCV: 76.3 fL — ABNORMAL LOW (ref 78.0–100.0)
Platelets: 297 10*3/uL (ref 150–400)
RBC: 3.79 MIL/uL — ABNORMAL LOW (ref 3.87–5.11)
RDW: 17.5 % — ABNORMAL HIGH (ref 11.5–15.5)
WBC: 15.6 10*3/uL — ABNORMAL HIGH (ref 4.0–10.5)

## 2012-04-09 LAB — URINALYSIS, ROUTINE W REFLEX MICROSCOPIC
Bilirubin Urine: NEGATIVE
Glucose, UA: NEGATIVE mg/dL
Ketones, ur: NEGATIVE mg/dL
Nitrite: NEGATIVE
Protein, ur: NEGATIVE mg/dL
Specific Gravity, Urine: 1.025 (ref 1.005–1.030)
Urobilinogen, UA: 0.2 mg/dL (ref 0.0–1.0)
pH: 7 (ref 5.0–8.0)

## 2012-04-09 LAB — DIFFERENTIAL
Basophils Absolute: 0 10*3/uL (ref 0.0–0.1)
Basophils Relative: 0 % (ref 0–1)
Eosinophils Absolute: 0 10*3/uL (ref 0.0–0.7)
Eosinophils Relative: 0 % (ref 0–5)
Lymphocytes Relative: 16 % (ref 12–46)
Lymphs Abs: 2.8 10*3/uL (ref 0.7–4.0)
Monocytes Absolute: 1 10*3/uL (ref 0.1–1.0)
Monocytes Relative: 6 % (ref 3–12)
Neutro Abs: 13.3 10*3/uL — ABNORMAL HIGH (ref 1.7–7.7)
Neutrophils Relative %: 78 % — ABNORMAL HIGH (ref 43–77)

## 2012-04-09 LAB — TYPE AND SCREEN
ABO/RH(D): B POS
Antibody Screen: NEGATIVE

## 2012-04-09 LAB — APTT: aPTT: 30 seconds (ref 24–37)

## 2012-04-09 LAB — PROTIME-INR
INR: 0.92 (ref 0.00–1.49)
Prothrombin Time: 12.6 seconds (ref 11.6–15.2)

## 2012-04-09 SURGERY — IRRIGATION AND DEBRIDEMENT KNEE
Anesthesia: General | Site: Knee | Laterality: Right | Wound class: Dirty or Infected

## 2012-04-09 MED ORDER — OXYCODONE HCL 5 MG PO TABS
10.0000 mg | ORAL_TABLET | ORAL | Status: DC | PRN
  Administered 2012-04-09 – 2012-04-11 (×8): 10 mg via ORAL
  Filled 2012-04-09 (×8): qty 2

## 2012-04-09 MED ORDER — MEPERIDINE HCL 25 MG/ML IJ SOLN: 6.2500 mg | INTRAMUSCULAR | Status: DC | PRN

## 2012-04-09 MED ORDER — LACTATED RINGERS IV SOLN
INTRAVENOUS | Status: DC | PRN
  Administered 2012-04-09: 11:00:00 via INTRAVENOUS

## 2012-04-09 MED ORDER — CEFAZOLIN SODIUM 1-5 GM-% IV SOLN
1.0000 g | Freq: Four times a day (QID) | INTRAVENOUS | Status: DC
  Administered 2012-04-09 (×3): 1 g via INTRAVENOUS
  Filled 2012-04-09 (×4): qty 50

## 2012-04-09 MED ORDER — FENTANYL CITRATE 0.05 MG/ML IJ SOLN
INTRAMUSCULAR | Status: DC | PRN
  Administered 2012-04-09: 25 ug via INTRAVENOUS
  Administered 2012-04-09: 50 ug via INTRAVENOUS
  Administered 2012-04-09 (×3): 25 ug via INTRAVENOUS
  Administered 2012-04-09 (×2): 50 ug via INTRAVENOUS

## 2012-04-09 MED ORDER — METOCLOPRAMIDE HCL 10 MG PO TABS: 5.0000 mg | ORAL_TABLET | Freq: Three times a day (TID) | ORAL | Status: DC | PRN

## 2012-04-09 MED ORDER — CYCLOBENZAPRINE HCL 10 MG PO TABS
10.0000 mg | ORAL_TABLET | Freq: Three times a day (TID) | ORAL | Status: DC
  Administered 2012-04-09 – 2012-04-11 (×6): 10 mg via ORAL
  Filled 2012-04-09 (×9): qty 1

## 2012-04-09 MED ORDER — ACETAMINOPHEN 650 MG RE SUPP: 650.0000 mg | Freq: Four times a day (QID) | RECTAL | Status: DC | PRN

## 2012-04-09 MED ORDER — SODIUM CHLORIDE 0.9 % IV SOLN
INTRAVENOUS | Status: DC
  Administered 2012-04-09: 02:00:00 via INTRAVENOUS

## 2012-04-09 MED ORDER — MENTHOL 3 MG MT LOZG: 1.0000 | LOZENGE | OROMUCOSAL | Status: DC | PRN

## 2012-04-09 MED ORDER — PHENOL 1.4 % MT LIQD
1.0000 | OROMUCOSAL | Status: DC | PRN
  Filled 2012-04-09: qty 177

## 2012-04-09 MED ORDER — ONDANSETRON HCL 4 MG/2ML IJ SOLN: 4.0000 mg | Freq: Four times a day (QID) | INTRAMUSCULAR | Status: DC | PRN

## 2012-04-09 MED ORDER — POTASSIUM CHLORIDE CRYS ER 10 MEQ PO TBCR
10.0000 meq | EXTENDED_RELEASE_TABLET | Freq: Two times a day (BID) | ORAL | Status: DC
Start: 2012-04-09 — End: 2012-04-11
  Administered 2012-04-09 – 2012-04-11 (×5): 10 meq via ORAL
  Filled 2012-04-09 (×6): qty 1

## 2012-04-09 MED ORDER — LACTATED RINGERS IV SOLN: INTRAVENOUS | Status: DC

## 2012-04-09 MED ORDER — ZOLPIDEM TARTRATE 5 MG PO TABS: 10.0000 mg | ORAL_TABLET | Freq: Every evening | ORAL | Status: DC | PRN

## 2012-04-09 MED ORDER — LIDOCAINE HCL (CARDIAC) 20 MG/ML IV SOLN
INTRAVENOUS | Status: DC | PRN
  Administered 2012-04-09: 100 mg via INTRAVENOUS

## 2012-04-09 MED ORDER — MIDAZOLAM HCL 5 MG/5ML IJ SOLN
INTRAMUSCULAR | Status: DC | PRN
  Administered 2012-04-09: 2 mg via INTRAVENOUS

## 2012-04-09 MED ORDER — ONDANSETRON HCL 4 MG PO TABS: 4.0000 mg | ORAL_TABLET | Freq: Four times a day (QID) | ORAL | Status: DC | PRN

## 2012-04-09 MED ORDER — PROPOFOL 10 MG/ML IV BOLUS
INTRAVENOUS | Status: DC | PRN
  Administered 2012-04-09: 170 mg via INTRAVENOUS

## 2012-04-09 MED ORDER — SENNOSIDES-DOCUSATE SODIUM 8.6-50 MG PO TABS: 1.0000 | ORAL_TABLET | Freq: Every evening | ORAL | Status: DC | PRN

## 2012-04-09 MED ORDER — GABAPENTIN 300 MG PO CAPS
300.0000 mg | ORAL_CAPSULE | Freq: Three times a day (TID) | ORAL | Status: DC
  Filled 2012-04-09 (×3): qty 1

## 2012-04-09 MED ORDER — HYDROMORPHONE HCL PF 1 MG/ML IJ SOLN: 0.5000 mg | INTRAMUSCULAR | Status: DC | PRN

## 2012-04-09 MED ORDER — ONDANSETRON HCL 4 MG/2ML IJ SOLN
INTRAMUSCULAR | Status: DC | PRN
  Administered 2012-04-09: 4 mg via INTRAVENOUS

## 2012-04-09 MED ORDER — KETOROLAC TROMETHAMINE 30 MG/ML IJ SOLN
30.0000 mg | Freq: Once | INTRAMUSCULAR | Status: AC
  Administered 2012-04-09: 30 mg via INTRAVENOUS

## 2012-04-09 MED ORDER — ENOXAPARIN SODIUM 30 MG/0.3ML ~~LOC~~ SOLN
30.0000 mg | Freq: Two times a day (BID) | SUBCUTANEOUS | Status: DC
  Administered 2012-04-10 – 2012-04-11 (×3): 30 mg via SUBCUTANEOUS
  Filled 2012-04-09 (×5): qty 0.3

## 2012-04-09 MED ORDER — DOCUSATE SODIUM 100 MG PO CAPS
100.0000 mg | ORAL_CAPSULE | Freq: Two times a day (BID) | ORAL | Status: DC
  Administered 2012-04-09 – 2012-04-11 (×5): 100 mg via ORAL
  Filled 2012-04-09 (×4): qty 1

## 2012-04-09 MED ORDER — HYDROCHLOROTHIAZIDE 25 MG PO TABS
25.0000 mg | ORAL_TABLET | Freq: Every day | ORAL | Status: DC
  Administered 2012-04-09 – 2012-04-11 (×3): 25 mg via ORAL
  Filled 2012-04-09 (×3): qty 1

## 2012-04-09 MED ORDER — LABETALOL HCL 5 MG/ML IV SOLN
INTRAVENOUS | Status: DC | PRN
  Administered 2012-04-09: 5 mg via INTRAVENOUS

## 2012-04-09 MED ORDER — ACETAMINOPHEN 325 MG PO TABS: 650.0000 mg | ORAL_TABLET | Freq: Four times a day (QID) | ORAL | Status: DC | PRN

## 2012-04-09 MED ORDER — PROMETHAZINE HCL 25 MG/ML IJ SOLN: 6.2500 mg | INTRAMUSCULAR | Status: DC | PRN

## 2012-04-09 MED ORDER — ZOLPIDEM TARTRATE 5 MG PO TABS: 5.0000 mg | ORAL_TABLET | Freq: Every evening | ORAL | Status: DC | PRN

## 2012-04-09 MED ORDER — LORAZEPAM 1 MG PO TABS: 1.0000 mg | ORAL_TABLET | Freq: Two times a day (BID) | ORAL | Status: DC | PRN

## 2012-04-09 MED ORDER — SODIUM CHLORIDE 0.9 % IR SOLN
Status: DC | PRN
  Administered 2012-04-09: 6000 mL

## 2012-04-09 MED ORDER — GABAPENTIN 300 MG PO CAPS
300.0000 mg | ORAL_CAPSULE | Freq: Three times a day (TID) | ORAL | Status: DC
  Administered 2012-04-09 – 2012-04-11 (×6): 300 mg via ORAL
  Filled 2012-04-09 (×8): qty 1

## 2012-04-09 MED ORDER — SODIUM CHLORIDE 0.9 % IV SOLN
INTRAVENOUS | Status: DC
  Administered 2012-04-09 – 2012-04-10 (×2): via INTRAVENOUS

## 2012-04-09 MED ORDER — FLEET ENEMA 7-19 GM/118ML RE ENEM
1.0000 | ENEMA | Freq: Once | RECTAL | Status: AC | PRN
  Filled 2012-04-09: qty 1

## 2012-04-09 MED ORDER — HYDROMORPHONE HCL PF 1 MG/ML IJ SOLN
0.2500 mg | INTRAMUSCULAR | Status: DC | PRN
  Administered 2012-04-09 (×4): 0.5 mg via INTRAVENOUS

## 2012-04-09 MED ORDER — OXYCODONE HCL 5 MG PO TABS: 10.0000 mg | ORAL_TABLET | ORAL | Status: DC | PRN

## 2012-04-09 MED ORDER — BUPIVACAINE-EPINEPHRINE 0.25% -1:200000 IJ SOLN
INTRAMUSCULAR | Status: DC | PRN
  Administered 2012-04-09: 20 mL

## 2012-04-09 MED ORDER — METOCLOPRAMIDE HCL 5 MG/ML IJ SOLN
5.0000 mg | Freq: Three times a day (TID) | INTRAMUSCULAR | Status: DC | PRN
  Filled 2012-04-09: qty 2

## 2012-04-09 MED ORDER — CALCIUM CARBONATE-VITAMIN D 500-200 MG-UNIT PO TABS
1.0000 | ORAL_TABLET | Freq: Every day | ORAL | Status: DC
  Administered 2012-04-09 – 2012-04-11 (×3): 1 via ORAL
  Filled 2012-04-09 (×3): qty 1

## 2012-04-09 MED ORDER — CEFAZOLIN SODIUM-DEXTROSE 2-3 GM-% IV SOLR
2.0000 g | Freq: Four times a day (QID) | INTRAVENOUS | Status: AC
  Administered 2012-04-09 (×2): 2 g via INTRAVENOUS
  Filled 2012-04-09 (×2): qty 50

## 2012-04-09 MED ORDER — HYDROMORPHONE HCL PF 1 MG/ML IJ SOLN
1.0000 mg | INTRAMUSCULAR | Status: DC | PRN
  Administered 2012-04-09 – 2012-04-10 (×3): 1 mg via INTRAVENOUS
  Filled 2012-04-09 (×3): qty 1

## 2012-04-09 MED ORDER — LORAZEPAM 1 MG PO TABS
1.0000 mg | ORAL_TABLET | Freq: Two times a day (BID) | ORAL | Status: DC | PRN
  Administered 2012-04-09: 1 mg via ORAL
  Filled 2012-04-09: qty 1

## 2012-04-09 MED ORDER — DOCUSATE SODIUM 100 MG PO CAPS
100.0000 mg | ORAL_CAPSULE | Freq: Two times a day (BID) | ORAL | Status: DC
  Filled 2012-04-09: qty 1

## 2012-04-09 MED ORDER — HYDROMORPHONE HCL PF 1 MG/ML IJ SOLN
0.5000 mg | Freq: Once | INTRAMUSCULAR | Status: AC
  Administered 2012-04-09: 0.5 mg via INTRAVENOUS

## 2012-04-09 MED ORDER — DIPHENHYDRAMINE HCL 25 MG PO CAPS
25.0000 mg | ORAL_CAPSULE | ORAL | Status: DC | PRN
  Administered 2012-04-09: 25 mg via ORAL
  Filled 2012-04-09: qty 1

## 2012-04-09 MED ORDER — OXYCODONE HCL 5 MG PO TABS: 5.0000 mg | ORAL_TABLET | ORAL | Status: DC | PRN

## 2012-04-09 SURGICAL SUPPLY — 57 items
BANDAGE ELASTIC 4 VELCRO ST LF (GAUZE/BANDAGES/DRESSINGS) ×1 IMPLANT
BANDAGE ELASTIC 6 VELCRO ST LF (GAUZE/BANDAGES/DRESSINGS) ×1 IMPLANT
BANDAGE ESMARK 6X9 LF (GAUZE/BANDAGES/DRESSINGS) ×1 IMPLANT
BLADE SURG ROTATE 9660 (MISCELLANEOUS) IMPLANT
BNDG CMPR 9X6 STRL LF SNTH (GAUZE/BANDAGES/DRESSINGS) ×1
BNDG ESMARK 6X9 LF (GAUZE/BANDAGES/DRESSINGS) ×2
BOWL SMART MIX CTS (DISPOSABLE) IMPLANT
CLOTH BEACON ORANGE TIMEOUT ST (SAFETY) ×2 IMPLANT
COVER BACK TABLE 24X17X13 BIG (DRAPES) ×1 IMPLANT
COVER SURGICAL LIGHT HANDLE (MISCELLANEOUS) ×2 IMPLANT
CUFF TOURNIQUET SINGLE 34IN LL (TOURNIQUET CUFF) IMPLANT
CUFF TOURNIQUET SINGLE 44IN (TOURNIQUET CUFF) ×1 IMPLANT
DECANTER SPIKE VIAL GLASS SM (MISCELLANEOUS) ×1 IMPLANT
DRAPE EXTREMITY T 121X128X90 (DRAPE) ×2 IMPLANT
DRAPE INCISE IOBAN 66X45 STRL (DRAPES) ×1 IMPLANT
DRSG ADAPTIC 3X8 NADH LF (GAUZE/BANDAGES/DRESSINGS) ×1 IMPLANT
DRSG PAD ABDOMINAL 8X10 ST (GAUZE/BANDAGES/DRESSINGS) ×1 IMPLANT
DURAPREP 26ML APPLICATOR (WOUND CARE) ×3 IMPLANT
ELECT REM PT RETURN 9FT ADLT (ELECTROSURGICAL) ×2
ELECTRODE REM PT RTRN 9FT ADLT (ELECTROSURGICAL) ×1 IMPLANT
EVACUATOR 1/8 PVC DRAIN (DRAIN) ×1 IMPLANT
FACESHIELD LNG OPTICON STERILE (SAFETY) ×4 IMPLANT
GLOVE BIOGEL PI IND STRL 8 (GLOVE) ×4 IMPLANT
GLOVE BIOGEL PI INDICATOR 8 (GLOVE) ×2
GLOVE SURG SS PI 6.5 STRL IVOR (GLOVE) ×2 IMPLANT
GLOVE SURG SS PI 8.0 STRL IVOR (GLOVE) ×5 IMPLANT
GOWN PREVENTION PLUS XLARGE (GOWN DISPOSABLE) ×5 IMPLANT
GOWN STRL NON-REIN LRG LVL3 (GOWN DISPOSABLE) ×4 IMPLANT
HANDPIECE INTERPULSE COAX TIP (DISPOSABLE) ×2
IMMOBILIZER KNEE 22 UNIV (SOFTGOODS) ×1 IMPLANT
KIT BASIN OR (CUSTOM PROCEDURE TRAY) ×1 IMPLANT
KIT ROOM TURNOVER OR (KITS) ×2 IMPLANT
MANIFOLD NEPTUNE II (INSTRUMENTS) ×2 IMPLANT
NDL HYPO 25GX1X1/2 BEV (NEEDLE) IMPLANT
NEEDLE HYPO 25GX1X1/2 BEV (NEEDLE) ×2 IMPLANT
NS IRRIG 1000ML POUR BTL (IV SOLUTION) ×2 IMPLANT
PACK TOTAL JOINT (CUSTOM PROCEDURE TRAY) ×2 IMPLANT
PAD ARMBOARD 7.5X6 YLW CONV (MISCELLANEOUS) ×3 IMPLANT
PAD CAST 4YDX4 CTTN HI CHSV (CAST SUPPLIES) ×1 IMPLANT
PADDING CAST COTTON 4X4 STRL (CAST SUPPLIES)
PADDING CAST COTTON 6X4 STRL (CAST SUPPLIES) ×1 IMPLANT
SET HNDPC FAN SPRY TIP SCT (DISPOSABLE) IMPLANT
SPONGE GAUZE 4X4 12PLY (GAUZE/BANDAGES/DRESSINGS) ×1 IMPLANT
STAPLER VISISTAT 35W (STAPLE) ×1 IMPLANT
STRIP CLOSURE SKIN 1/2X4 (GAUZE/BANDAGES/DRESSINGS) ×2 IMPLANT
SUCTION FRAZIER TIP 10 FR DISP (SUCTIONS) ×1 IMPLANT
SUT ETHILON 2 0 PSLX (SUTURE) ×3 IMPLANT
SUT MNCRL AB 3-0 PS2 18 (SUTURE) ×1 IMPLANT
SUT PDS AB 0 CT 36 (SUTURE) ×6 IMPLANT
SUT VIC AB 0 CTB1 27 (SUTURE) ×3 IMPLANT
SUT VIC AB 2-0 CTB1 (SUTURE) ×2 IMPLANT
SYR CONTROL 10ML LL (SYRINGE) ×1 IMPLANT
TOWEL OR 17X24 6PK STRL BLUE (TOWEL DISPOSABLE) ×2 IMPLANT
TOWEL OR 17X26 10 PK STRL BLUE (TOWEL DISPOSABLE) ×2 IMPLANT
TRAY FOLEY CATH 14FR (SET/KITS/TRAYS/PACK) IMPLANT
TUBE ANAEROBIC SPECIMEN COL (MISCELLANEOUS) ×2 IMPLANT
WATER STERILE IRR 1000ML POUR (IV SOLUTION) ×3 IMPLANT

## 2012-04-09 NOTE — Anesthesia Postprocedure Evaluation (Signed)
  Anesthesia Post-op Note  Patient: [name]  Procedure(s) Performed: Procedure(s) (LRB): IRRIGATION AND DEBRIDEMENT KNEE (Right)  Patient Location: PACU  Anesthesia Type: General  Level of Consciousness: awake  Airway and Oxygen Therapy: Patient Spontanous Breathing  Post-op Pain: moderate  Post-op Assessment: Post-op Vital signs reviewed, Patient's Cardiovascular Status Stable and Respiratory Function Stable  Post-op Vital Signs: stable  Complications: No apparent anesthesia complications

## 2012-04-09 NOTE — Preoperative (Signed)
Beta Blockers   Reason not to administer Beta Blockers:Not Applicable 

## 2012-04-09 NOTE — Anesthesia Procedure Notes (Signed)
Procedure Name: LMA Insertion Date/Time: 04/09/2012 10:56 AM Performed by: Jerilee Hoh Pre-anesthesia Checklist: Patient identified, Emergency Drugs available, Patient being monitored and Suction available Patient Re-evaluated:Patient Re-evaluated prior to inductionOxygen Delivery Method: Circle system utilized Preoxygenation: Pre-oxygenation with 100% oxygen Intubation Type: IV induction Ventilation: Mask ventilation without difficulty LMA: LMA inserted LMA Size: 4.0 Tube type: Oral Number of attempts: 1 Placement Confirmation: positive ETCO2 Tube secured with: Tape

## 2012-04-09 NOTE — Progress Notes (Addendum)
Late entry for 04/07/12:  Patient was found on the floor tonight at 2015, sitting by the side of the bed on her butt.  Patient states that she had maneuvered herself to the side of the bed, and went to tie her gown, and in the process slipped off the bed onto the floor.  Patient did not use her call bell for help in order to maneuver about in the bed prior to this incident.  Patient states that she had not hit her head.  No acute signs of distress noted.  Vital signs were taken, physical assessment performed and found to be normal, and patient was transferred back to bed without issue.  Patient did not report any abnormal pain, or pain to her knee.  Patient had fell earlier in the day when working with PT, and an x-ray was performed of her knee without any significant findings.  I spoke with Dr. Carola Frost whom was on call for Dr. Thurston Hole at the time, explained what had happened, and no new orders were given.  Later, patient began to complain of lower back pain, but states that she had had back surgery in December of 2012, and had been experiencing chronic back pain ever since.  No bruising or acute changes noted to patient's back.  Spoke with Montez Morita, PA whom was on call for Dr. Thurston Hole, was instructed to place ice on back and monitor area.  No new changes or increased pain noted by patient.  Pain medication given with positive effect.  Patient did not feel it was necessary to notify family of this incident.

## 2012-04-09 NOTE — Interval H&P Note (Signed)
History and Physical Interval Note:  04/09/2012 10:27 AM  [name]  has presented today for surgery, with the diagnosis of Infected abrasion of knee [916.1]  The various methods of treatment have been discussed with the patient and family. After consideration of risks, benefits and other options for treatment, the patient has consented to  Procedure(s) (LRB): IRRIGATION AND DEBRIDEMENT KNEE (Right) as a surgical intervention .  The patient's history has been reviewed, patient examined, no change in status, stable for surgery.  I have reviewed the patients' chart and labs.  Questions were answered to the patient's satisfaction.     Astryd Pearcy JR,W D

## 2012-04-09 NOTE — Anesthesia Preprocedure Evaluation (Addendum)
Anesthesia Evaluation    Reviewed: Allergy & Precautions, H&P , NPO status , Patient's Chart, lab work & pertinent test results, reviewed documented beta blocker date and time   Airway Mallampati: II TM Distance: >3 FB Neck ROM: Full    Dental  (+) Partial Upper and Dental Advisory Given   Pulmonary shortness of breath and with exertion, asthma , former smoker   + decreased breath sounds      Cardiovascular hypertension, Pt. on medications Rhythm:Regular Rate:Normal     Neuro/Psych  Headaches, Anxiety Depression    GI/Hepatic GERD-  ,  Endo/Other  Hyperthyroidism Morbid obesity  Renal/GU      Musculoskeletal  (+) Fibromyalgia -  Abdominal (+) + obese,   Peds  Hematology   Anesthesia Other Findings   Reproductive/Obstetrics                         Anesthesia Physical Anesthesia Plan  ASA: II  Anesthesia Plan: General   Post-op Pain Management:    Induction: Intravenous  Airway Management Planned: Oral ETT and LMA  Additional Equipment:   Intra-op Plan:   Post-operative Plan: Extubation in OR  Informed Consent:   Plan Discussed with: CRNA and Surgeon  Anesthesia Plan Comments:        Anesthesia Quick Evaluation

## 2012-04-09 NOTE — Op Note (Signed)
NAMEMarland Kitchen  [name], [name] NO.:  [id number]  MEDICAL RECORD NO.:  [id number]  LOCATION:  6N29C                        FACILITY:  [facility]  PHYSICIAN:  Dyke Brackett, M.D.    DATE OF BIRTH:  [birth year]  DATE OF PROCEDURE: DATE OF DISCHARGE:  04/08/2012                              OPERATIVE REPORT   INDICATIONS:  The patient is a 51 year old female of Dr. Sherene Sires recently released from the hospital, who had suffered apparently several falls on to her operative right knee since the surgery.  She presented to the urgent care with my physician's assistant Mr. Vincent Peyer evaluated her, obtained advice from Dr. Cleophas Dunker who is on call for admission based on the fact that there was a 2 to 3 cm opening in the incision with some concern that this may have spread deeply to the capsular repair.  She was admitted last night for definitive procedure today.  PREOPERATIVE DIAGNOSES: 1. Partial wound dehiscence with disruption of capsular closure. 2. Partial rupture patellar tendon.  POSTOPERATIVE DIAGNOSIS: 1. Partial wound dehiscence with disruption of capsular closure. 2. Partial rupture patellar tendon.  OPERATION:  I and D of knee with repair partial patellar tendon rupture and re-repair capsular repair.  SURGEON:  Dyke Brackett, M.D.  ASSISTANT:  Margart Sickles, PA-C.  ANESTHESIA:  General anesthetic.  No tourniquet was used.  DESCRIPTION OF PROCEDURE:  The patient was sterilely prepped and draped in the supine position.  With the small opening in about the midportion of the incision, we elected to open the whole incision, and on inspecting the whole incision deep to this, the capsule repair was disrupted, suture had either ruptured or pulled loose from its most distal attachment.  There was exposed prosthesis, although it was noted that the wound itself to the skin was relatively small.  On viewing that, I performed copious lavage and debridement with 6000 mL of  fluid. With the knee extended and flexed, there was no problem relative to the components themselves in terms of the alignment and positioning, although, I did note there was some partial disruption of the patellar tendon.  Preoperatively, I examined the patient and noted that she actually had good full extension indicating that probably at least 75% of the tendon was intact, and the portion of the tendon that had ruptured was along the side of the capsular closure.  She could not repair this small portion back distally, but I could repair side to side, which gave me more material to close the retinaculum.  In view of the fact that the wound I would say was not infected but contaminated,  I elected to perform a capsular closure with absorbable monofilament suture specifically 0 and 2-0 PDS.  I did place a Hemovac drain in the lateral gutter exiting superolaterally.  Closure was affected with multiple PDS into the capsular area as well as PDS in the subcu.  Skin closure was affected with multiple interrupted 2-0 monofilament nylon, Marcaine with epinephrine, and skin lightly compressive sterile dressing was applied.  Plan for the patient to at least be admitted overnight for continued IV antibiotic prophylaxis and DVT prophylaxis.  Her activity level on record will be  dictated to only when she is up out of bed, she must have the knee immobilizer on and she stated to me preoperatively the knee was frequently giving way as potential cause of her fall.  She denied any other central-type symptoms to account with that.  A definitive followup and plan beyond this per Dr. Thurston Hole.     Dyke Brackett, M.D.     WDC/MEDQ  D:  04/09/2012  T:  04/09/2012  Job:  [id number]

## 2012-04-09 NOTE — Op Note (Signed)
Dictated # [id number]

## 2012-04-09 NOTE — H&P (View-Only) (Signed)
[name] is an 51 y.o. female.   Chief Complaint: right knee DJD HPI: [name] comes in today for evaluation of her right knee.  She is a 51 year-old [place] American female with a history of bilateral knee DJD.  She has failed conservative care, including anti-inflammatories and intraarticular Cortisone injections, as well as intraarticular Supartz injections.  She requires assistance rising from a chair and due to the excruciating pain is planning on a left total knee replacement.  She is disabled from chronic back pain and has recently undergone a lumbar fusion.  She had a left total knee on 01/27/2012.  Now ready for her right.  Past Medical History  Diagnosis Date  . Bronchitis   . Jones fracture     left foot fifth metatarsal  . Fibromyalgia   . Varicose vein     protrudes above skin-per pt;vein popped and bruised;ultrasound done to make sure that there were no clots;noclots were found  . Chronic back pain   . Multiple allergies   . Asthma     but pt states not severe enough to even have an inhaler  . Shortness of breath     with exertion;pt states that its related to her weight  . Hyperthyroidism     but pt doesn't take anything for it;thyroid nodules  . Headache   . Dizziness     rarely  . Impaired memory   . Joint pain   . Joint swelling   . GERD (gastroesophageal reflux disease)     doesn't take anything for it  . IBS (irritable bowel syndrome)   . Arthritis     bilateral knees  . Bruising     pt states unexplained d/t fibromyalgia  . Urinary frequency   . Increased thirst   . Anemia   . Anxiety     takes Ativan and Valium  . Depression     from Fibromyalgia diagnosis  . Insomnia     takes Ambien  . Sore gums     this is why pt is on Amoxil-only takes for dental work  . Hypertension     recently diagnosed (this month)  . Left knee DJD     Past Surgical History  Procedure Date  . Cesarean section 1984  . Spine surgery 2004    Cervical plate, ACDF  .  Knee surgery 2005    Left knee arthroscopy  . Nasal septoplasty w/ turbinoplasty 2007  . Tonsillectomy 2007  . Fracture surgery 05/08/2010    Jones fracture left foot fifth metatarsal  . Anterior lumbar fusion 09/20/2011    Procedure: ANTERIOR LUMBAR FUSION 1 LEVEL;  Surgeon: Carmela Hurt;  Location: [facility];  Service: Neurosurgery;  Laterality: N/A;  Lumbar five-Sacral One Anterior Lumbar Interbody Fusion /Dr. Early to Approach   . Cervical disc surgery     WITH TITANIUM PLATE IN NECK---LEFT SIDE  . Abdominal hysterectomy     partial hysterectomy -mid 2000's,   . Total knee arthroplasty 01/27/2012    Procedure: TOTAL KNEE ARTHROPLASTY;  Surgeon: Nilda Simmer, MD;  Location: [facility];  Service: Orthopedics;  Laterality: Left;  DR Thurston Hole WANTS 90 MINUTES FOR THIS CASE  . Steriod injection 01/27/2012    Procedure: STEROID INJECTION;  Surgeon: Nilda Simmer, MD;  Location: [facility] OR;  Service: Orthopedics;  Laterality: Right;  . Joint replacement 01-27-2012    left total knee    Family History  Problem Relation Age of Onset  .  Stroke Father   . Anesthesia problems Neg Hx   . Hypotension Neg Hx   . Malignant hyperthermia Neg Hx   . Pseudochol deficiency Neg Hx   . Arthritis Sister    Social History:  reports that she quit smoking about 13 years ago. She has never used smokeless tobacco. She reports that she does not drink alcohol or use illicit drugs.  Allergies:  Allergies  Allergen Reactions  . Shellfish Allergy Anaphylaxis  . Latex Other (See Comments)    Itching/rash  . Other Other (See Comments)    MSG-   Current Outpatient Prescriptions on File Prior to Visit  Medication Sig Dispense Refill  . calcium-vitamin D (OSCAL WITH D) 500-200 MG-UNIT per tablet Take 1 tablet by mouth daily.      . cyclobenzaprine (FLEXERIL) 10 MG tablet Take 10 mg by mouth 3 (three) times daily.        . diclofenac sodium (VOLTAREN) 1 % GEL Apply 1 application topically 2 (two) times daily as  needed. For right shoulder and arm and legs      . DiphenhydrAMINE HCl (BENADRYL PO) Take 2 capsules by mouth 1 day or 1 dose. In case she comes in contact with shellfish or shrimp      . Docusate Calcium (STOOL SOFTENER PO) Take 1 tablet by mouth daily.      Marland Kitchen gabapentin (NEURONTIN) 300 MG capsule Take 300 mg by mouth 3 (three) times daily.        . hydrochlorothiazide (HYDRODIURIL) 25 MG tablet Take 25 mg by mouth daily. One tablet by mouth in the morning.       Marland Kitchen LORazepam (ATIVAN) 1 MG tablet Take 1 mg by mouth 2 (two) times daily as needed. For anxiety attacks      . meloxicam (MOBIC) 15 MG tablet Take 15 mg by mouth daily.      Marland Kitchen oxyCODONE-acetaminophen (PERCOCET) 5-325 MG per tablet Take 1 tablet by mouth every 4 (four) hours as needed.      . zolpidem (AMBIEN) 10 MG tablet Take 10 mg by mouth at bedtime as needed. For sleep         (Not in a hospital admission)  No results found for this or any previous visit (from the past 48 hour(s)). No results found.  Review of Systems  Constitutional: Negative.   HENT: Negative.   Eyes: Negative.   Respiratory: Negative.   Cardiovascular: Negative.   Gastrointestinal: Negative.   Genitourinary: Negative.   Musculoskeletal: Positive for joint pain.  Skin: Negative.   Neurological: Negative.   Endo/Heme/Allergies: Negative.   Psychiatric/Behavioral: Negative.     Blood pressure 123/83, pulse 112, temperature 98.4 F (36.9 C), height 5\' 2"  (1.575 m), weight 106.142 kg (234 lb), SpO2 97.00%. Physical Exam  Constitutional: She is oriented to person, place, and time. She appears well-developed and well-nourished.  HENT:  Head: Normocephalic and atraumatic.  Mouth/Throat: Oropharynx is clear and moist.  Eyes: EOM are normal. Pupils are equal, round, and reactive to light.  Neck: Neck supple.  Cardiovascular: Regular rhythm.        tachycardia  Respiratory: Breath sounds normal.  GI: Soft. Bowel sounds are normal.  Genitourinary:        Not pertinent to current symptomatology therefore not examined.  Musculoskeletal:       Left knee has active range of motion 0-120 degrees.  Surgical wound is well approximated and healed.  4-/5 strength moderately antalgic gait. Right knee active range of motion  0-120 degrees 2+ crepitus 1+ synovitis medial joint line tenderness normal patella tracking mild varus deformity. She is neurovascularly intact.    Neurological: She is alert and oriented to person, place, and time.  Skin: Skin is warm and dry.  Psychiatric: She has a normal mood and affect. Her behavior is normal. Judgment and thought content normal.     Assessment Patient Active Problem List  Diagnosis  . Lumbar degenerative disc disease  . Fibromyalgia  . Joint pain  . GERD (gastroesophageal reflux disease)  . Anxiety  . Depression  . Hypertension  . Left knee DJD  . Hypokalemia  . Postoperative anemia due to acute blood loss  . Right knee DJD  . Status post left total prosthetic replacement of knee joint using cement   Plan Right total knee replacement.  The risks, benefits, and possible complications of the procedure were discussed in detail with the patient.  The patient is without question.  [name] J 03/24/2012, 3:16 PM

## 2012-04-09 NOTE — Transfer of Care (Signed)
Immediate Anesthesia Transfer of Care Note  Patient: [name]  Procedure(s) Performed: Procedure(s) (LRB): IRRIGATION AND DEBRIDEMENT KNEE (Right)  Patient Location: PACU  Anesthesia Type: General  Level of Consciousness: awake, alert  and oriented  Airway & Oxygen Therapy: Patient Spontanous Breathing and Patient connected to nasal cannula oxygen  Post-op Assessment: Report given to PACU RN, Post -op Vital signs reviewed and stable and Patient moving all extremities  Post vital signs: Reviewed and stable  Complications: No apparent anesthesia complications

## 2012-04-09 NOTE — Brief Op Note (Signed)
04/08/2012 - 04/09/2012  12:46 PM  PATIENT:  [name]  51 y.o. female  PRE-OPERATIVE DIAGNOSIS:  Infected abrasion of knee [916.1]  POST-OPERATIVE DIAGNOSIS:  Infected abrasion of knee [916.1]  PROCEDURE:  Procedure(s) (LRB): IRRIGATION AND DEBRIDEMENT KNEE (Right)  SURGEON:  Surgeon(s) and Role:    * W D Carloyn Manner., MD - Primary  PHYSICIAN ASSISTANT:   ASSISTANTS: Margart Sickles, PA-C   ANESTHESIA:   local and general  EBL:  Total I/O In: 600 [I.V.:600] Out: 50 [Blood:50]  BLOOD ADMINISTERED:none  DRAINS: Hemovac drain R knee to self suction   LOCAL MEDICATIONS USED:  MARCAINE     SPECIMEN:  No Specimen  DISPOSITION OF SPECIMEN:  Micro, culture wound  COUNTS:  YES  TOURNIQUET:  * No tourniquets in log *  DICTATION: .Other Dictation: Dictation Number   PLAN OF CARE: Admit to inpatient   PATIENT DISPOSITION:  PACU - hemodynamically stable.   Delay start of Pharmacological VTE agent (>24hrs) due to surgical blood loss or risk of bleeding: yes

## 2012-04-09 NOTE — Progress Notes (Signed)
Late Entry for 04/08/12:  Upon dressing change for patient's right total knee replacement, some of the stitches had dissolved and the skin had been discovered to be opened.  Minimal amount of bleeding at the site.  Pressure applied, then steri strips placed, and new dressing applied. Kirstin Shepperson, PA made aware of this occurrence and observed steri strips at the site.  Patient instructed not to remove steri strips upon discharge.

## 2012-04-10 ENCOUNTER — Encounter (HOSPITAL_COMMUNITY): Payer: Self-pay | Admitting: Orthopedic Surgery

## 2012-04-10 LAB — CBC
HCT: 30 % — ABNORMAL LOW (ref 36.0–46.0)
Hemoglobin: 9.2 g/dL — ABNORMAL LOW (ref 12.0–15.0)
MCH: 23.9 pg — ABNORMAL LOW (ref 26.0–34.0)
MCHC: 30.7 g/dL (ref 30.0–36.0)
MCV: 77.9 fL — ABNORMAL LOW (ref 78.0–100.0)
Platelets: 315 10*3/uL (ref 150–400)
RBC: 3.85 MIL/uL — ABNORMAL LOW (ref 3.87–5.11)
RDW: 17.7 % — ABNORMAL HIGH (ref 11.5–15.5)
WBC: 14.8 10*3/uL — ABNORMAL HIGH (ref 4.0–10.5)

## 2012-04-10 LAB — BASIC METABOLIC PANEL
BUN: 10 mg/dL (ref 6–23)
CO2: 25 mEq/L (ref 19–32)
Calcium: 8.8 mg/dL (ref 8.4–10.5)
Chloride: 106 mEq/L (ref 96–112)
Creatinine, Ser: 0.63 mg/dL (ref 0.50–1.10)
GFR calc Af Amer: >90 mL/min (ref >90)
GFR calc non Af Amer: >90 mL/min (ref >90)
Glucose, Bld: 87 mg/dL (ref 70–99)
Potassium: 4 mEq/L (ref 3.5–5.1)
Sodium: 142 mEq/L (ref 135–145)

## 2012-04-10 MED ORDER — CEPHALEXIN 500 MG PO CAPS
500.0000 mg | ORAL_CAPSULE | Freq: Four times a day (QID) | ORAL | Status: DC
  Filled 2012-04-10 (×3): qty 1

## 2012-04-10 MED ORDER — CEPHALEXIN 500 MG PO CAPS
500.0000 mg | ORAL_CAPSULE | Freq: Four times a day (QID) | ORAL | Status: DC
  Administered 2012-04-10 – 2012-04-11 (×4): 500 mg via ORAL
  Filled 2012-04-10 (×9): qty 1

## 2012-04-10 MED ORDER — CEFAZOLIN SODIUM-DEXTROSE 2-3 GM-% IV SOLR
2.0000 g | Freq: Three times a day (TID) | INTRAVENOUS | Status: DC
  Administered 2012-04-10: 2 g via INTRAVENOUS
  Filled 2012-04-10 (×4): qty 50

## 2012-04-10 MED ORDER — CEPHALEXIN 500 MG PO CAPS: 500.0000 mg | ORAL_CAPSULE | Freq: Four times a day (QID) | ORAL | Status: AC

## 2012-04-10 NOTE — Evaluation (Signed)
Physical Therapy Evaluation Patient Details Name: [name] MRN: [id number] DOB: March 29, 1961 Today's Date: 04/10/2012 Time: 4098-1191 PT Time Calculation (min): 38 min  PT Assessment / Plan / Recommendation Clinical Impression  Ms. [name] is 51 y/o female with recent R TKA (04/06/12) admitted for open/infected knee wound now s/p I&D 04/08/12. Presents to physical therapy today with decreased mobility, R knee ROM, RLE strength and increased pain. Will benefit physical therapy in the acute setting to address these as well as to maximize functional independence for improved safety at home. Rec HHPT as well as supervision/assist for all mobility upon going home.     PT Assessment  Patient needs continued PT services    Follow Up Recommendations  Home health PT;Supervision/Assistance - 24 hour    Barriers to Discharge        Equipment Recommendations  None recommended by PT    Recommendations for Other Services     Frequency Min 5X/week    Precautions / Restrictions Precautions Precautions: Knee;Fall Required Braces or Orthoses: Knee Immobilizer - Right Knee Immobilizer - Right: On at all times Restrictions RLE Weight Bearing: Weight bearing as tolerated         Mobility  Bed Mobility Supine to Sit: 4: Min assist;HOB elevated (30 degrees) Details for Bed Mobility Assistance: sequencing cues for hip and trunk movement to side of bed, minA to maneuver RLE off bed Transfers Transfers: Sit to Stand;Stand to Sit Sit to Stand: 4: Min assist;From toilet;From bed Stand to Sit: 4: Min assist;To toilet;To chair/3-in-1 Details for Transfer Assistance: cues for safe hand placement and min facilitation for follow through to stand Ambulation/Gait Ambulation/Gait Assistance: 4: Min guard;4: Min Environmental consultant (Feet): 40 Feet Assistive device: Rolling walker Ambulation/Gait Assistance Details: initial cues for sequencing with Rw especially safe spacing, sequencing cues with  pivoting/turning to sit on toilet with proper step sequence Gait Pattern: Decreased hip/knee flexion - right;Decreased stance time - right;Decreased step length - right    Exercises     PT Diagnosis: Difficulty walking;Abnormality of gait;Generalized weakness;Acute pain  PT Problem List: Decreased strength;Decreased range of motion;Decreased activity tolerance;Obesity;Pain;Decreased skin integrity;Decreased mobility PT Treatment Interventions: Gait training;DME instruction;Stair training;Functional mobility training;Therapeutic activities;Therapeutic exercise;Balance training;Patient/family education;Neuromuscular re-education   PT Goals Acute Rehab PT Goals Pt will go Supine/Side to Sit: with modified independence PT Goal: Supine/Side to Sit - Progress: Goal set today Pt will go Sit to Supine/Side: with modified independence PT Goal: Sit to Supine/Side - Progress: Goal set today Pt will go Sit to Stand: with modified independence PT Goal: Sit to Stand - Progress: Goal set today Pt will Ambulate: >150 feet;with modified independence;with least restrictive assistive device PT Goal: Ambulate - Progress: Goal set today Pt will Go Up / Down Stairs: 3-5 stairs;with rail(s);with min assist PT Goal: Up/Down Stairs - Progress: Goal set today Pt will Perform Home Exercise Program: Independently PT Goal: Perform Home Exercise Program - Progress: Goal set today  Visit Information  Last PT Received On: 04/10/12 Assistance Needed: +1 PT/OT Co-Evaluation/Treatment: Yes    Subjective Data  Subjective: Mother died by the hand of an doctor I didn't know, so I was not interested in having surgery by anyone other than my doctor.    Prior Functioning  Home Living Lives With: Alone Available Help at Discharge: Family;Available 24 hours/day Type of Home: House Home Access: Stairs to enter [employer] of Steps: 4 Entrance Stairs-Rails: Can reach both Home Layout: One level Bathroom  Shower/Tub: Engineer, manufacturing systems: Standard  Bathroom Accessibility: Yes How Accessible: Accessible via walker Home Adaptive Equipment: Bedside commode/3-in-1;Shower chair with back;Walker - rolling Prior Function Level of Independence: Independent with assistive device(s) Able to Take Stairs?: Yes Driving: No Vocation: On disability Comments: Pt had L TKA 01/27/12. Communication Communication: No difficulties Dominant Hand: Right    Cognition  Overall Cognitive Status: Appears within functional limits for tasks assessed/performed Arousal/Alertness: Awake/alert Orientation Level: Appears intact for tasks assessed Behavior During Session: [facility] for tasks performed Cognition - Other Comments: verbose    Extremity/Trunk Assessment Right Upper Extremity Assessment RUE ROM/Strength/Tone: WFL for tasks assessed RUE Sensation: WFL - Light Touch;WFL - Proprioception RUE Coordination: WFL - gross/fine motor Left Upper Extremity Assessment LUE ROM/Strength/Tone: WFL for tasks assessed LUE Sensation: WFL - Light Touch;WFL - Proprioception LUE Coordination: WFL - gross/fine motor Right Lower Extremity Assessment RLE ROM/Strength/Tone: Deficits;Due to pain;Unable to fully assess RLE ROM/Strength/Tone Deficits: kept in KI throughout session, able to weight bear during stance of gait without buckling, did not fully assess strength but able to DF/PF [facility] RLE Sensation: WFL - Light Touch;WFL - Proprioception Left Lower Extremity Assessment LLE ROM/Strength/Tone: WFL for tasks assessed LLE Sensation: WFL - Light Touch;WFL - Proprioception LLE Coordination: WFL - gross/fine motor Trunk Assessment Trunk Assessment: Normal   Balance    End of Session PT - End of Session Equipment Utilized During Treatment: Gait belt Activity Tolerance: Patient tolerated treatment well Patient left: in chair;with call bell/phone within reach Nurse Communication: Mobility status  GP     [facility]  HELEN 04/10/2012, 12:08 PM

## 2012-04-10 NOTE — Care Management Note (Signed)
  Page 1 of 1   04/10/2012     10:10:16 AM   CARE MANAGEMENT NOTE 04/10/2012  Patient:  COLEEN, CARDIFF   Account Number:  [id number]  Date Initiated:  04/10/2012  Documentation initiated by:  Ronny Flurry  Subjective/Objective Assessment:     Action/Plan:   Anticipated DC Date:  04/11/2012   Anticipated DC Plan:  HOME W HOME HEALTH SERVICES      DC Planning Services  CM consult      Choice offered to / List presented to:  C-1 Patient        HH arranged  HH-1 RN  HH-2 PT  HH-3 OT      [facility] agency  [facility]   Status of service:  Completed, signed off Medicare Important Message given?   (If response is "NO", the following Medicare IM given date fields will be blank) Date Medicare IM given:   Date Additional Medicare IM given:    Discharge Disposition:  HOME W HOME HEALTH SERVICES  Per UR Regulation:  Reviewed for med. necessity/level of care/duration of stay  If discussed at Long Length of Stay Meetings, dates discussed:    Comments:   04-10-12 Patietn already active with [facility]. Patient wanting to stay with [facility] , she is very pleased with their services.  Patient also stated she already has a walker , 3 in 1 at home . Ronny Flurry RN BSN [phone]

## 2012-04-10 NOTE — Progress Notes (Addendum)
     Subjective:  Patient reports pain as moderate. She still hasn't gotten up out of bed very well with physical therapy. She had multiple falls that ultimately led to this admission. She does not feel ready to go home.  Objective:   VITALS:   Filed Vitals:   04/09/12 2144 04/10/12 0128 04/10/12 0447 04/10/12 1026  BP: 114/68 116/66 161/69 143/83  Pulse: 95 81 78 97  Temp: 98.5 F (36.9 C) 98.2 F (36.8 C) 98.5 F (36.9 C) 98.1 F (36.7 C)  TempSrc: Oral Oral Oral Axillary  Resp: 20 18 18 18   Height:      Weight:      SpO2: 98% 96% 100% 100%    Neurologically intact Sensation intact distally Incision: dressing C/D/I Drain removed.  LABS  Results for orders placed during the hospital encounter of 04/08/12 (from the past 24 hour(s))  CBC     Status: Abnormal   Collection Time   04/10/12  6:55 AM      Component Value Range   WBC 14.8 (*) 4.0 - 10.5 K/uL   RBC 3.85 (*) 3.87 - 5.11 MIL/uL   Hemoglobin 9.2 (*) 12.0 - 15.0 g/dL   HCT 13.0 (*) 86.5 - 78.4 %   MCV 77.9 (*) 78.0 - 100.0 fL   MCH 23.9 (*) 26.0 - 34.0 pg   MCHC 30.7  30.0 - 36.0 g/dL   RDW 69.6 (*) 29.5 - 28.4 %   Platelets 315  150 - 400 K/uL  BASIC METABOLIC PANEL     Status: Normal   Collection Time   04/10/12  6:55 AM      Component Value Range   Sodium 142  135 - 145 mEq/L   Potassium 4.0  3.5 - 5.1 mEq/L   Chloride 106  96 - 112 mEq/L   CO2 25  19 - 32 mEq/L   Glucose, Bld 87  70 - 99 mg/dL   BUN 10  6 - 23 mg/dL   Creatinine, Ser 1.32  0.50 - 1.10 mg/dL   Calcium 8.8  8.4 - 44.0 mg/dL   GFR calc non Af Amer >90  >90 mL/min   GFR calc Af Amer >90  >90 mL/min    No results found.  Assessment/Plan: 1 Day Post-Op   Status post irrigation and debridement for wound dehiscence from traumatic capsular disruption, status post total knee replacement.  Advance diet Up with therapy Plan for discharge tomorrow Discharge home with home health   Montgomery Favor P 04/10/2012, 12:54 PM   Teryl Lucy, MD Cell [phone] Pager [phone]

## 2012-04-10 NOTE — Progress Notes (Signed)
Occupational Therapy Evaluation Patient Details Name: [name] MRN: [id number] DOB: Feb 09, 1961 Today's Date: 04/10/2012 Time: 2956-2130 OT Time Calculation (min): 37 min  OT Assessment / Plan / Recommendation Clinical Impression  Pt admitted with recent R TKA (04/06/12) admitted for open/infected knee wound now s/p I&D 04/08/12. Will benefit from acute OT services to address below problem list in prep for d/c home with 24/7 assist.    OT Assessment  Patient needs continued OT Services    Follow Up Recommendations  Home health OT;Supervision/Assistance - 24 hour    Barriers to Discharge      Equipment Recommendations  None recommended by PT    Recommendations for Other Services    Frequency  Min 2X/week    Precautions / Restrictions Precautions Precautions: Knee;Fall Required Braces or Orthoses: Knee Immobilizer - Right Knee Immobilizer - Right: On at all times   Pertinent Vitals/Pain See vitals    ADL  Grooming: Performed;Wash/dry hands;Min guard Where Assessed - Grooming: Unsupported standing Toilet Transfer: Performed;Minimal assistance Toilet Transfer Method: Sit to stand (ambulating) Acupuncturist: Comfort height toilet Toileting - Clothing Manipulation and Hygiene: Performed;Set up Where Assessed - Toileting Clothing Manipulation and Hygiene: Standing Equipment Used: Knee Immobilizer;Rolling walker;Gait belt Transfers/Ambulation Related to ADLs: min assist with RW. Required vebal cueing initially for safety awareness and sequencing with RW. ADL Comments: Min guard and verbal cueing for safe technique when standing to complete toileting task.    OT Diagnosis: Generalized weakness;Acute pain  OT Problem List: Decreased safety awareness;Decreased knowledge of use of DME or AE;Pain;Decreased activity tolerance;Impaired balance (sitting and/or standing) OT Treatment Interventions: Self-care/ADL training;Patient/family education;Balance training;DME and/or  AE instruction   OT Goals Acute Rehab OT Goals OT Goal Formulation: With patient Time For Goal Achievement: 04/17/12 Potential to Achieve Goals: Good ADL Goals Pt Will Perform Grooming: with set-up;Unsupported;Standing at sink ADL Goal: Grooming - Progress: Goal set today Pt Will Perform Lower Body Dressing: with set-up;with supervision;Unsupported;Sit to stand from bed;Sit to stand from chair;with adaptive equipment ADL Goal: Lower Body Dressing - Progress: Goal set today Pt Will Transfer to Toilet: with supervision;Ambulation;with DME;3-in-1 ADL Goal: Toilet Transfer - Progress: Goal set today Pt Will Perform Toileting - Clothing Manipulation: Independently;Standing ADL Goal: Toileting - Clothing Manipulation - Progress: Goal set today Pt Will Perform Toileting - Hygiene: Independently;Sit to stand from 3-in-1/toilet ADL Goal: Toileting - Hygiene - Progress: Goal set today Miscellaneous OT Goals Miscellaneous OT Goal #1: Pt will be Independent in/OOB for BADLs OT Goal: Miscellaneous Goal #1 - Progress: Goal set today  Visit Information  Last OT Received On: 04/10/12 Assistance Needed: +1 PT/OT Co-Evaluation/Treatment: Yes    Subjective Data      Prior Functioning  Home Living Lives With: Alone Available Help at Discharge: Family;Available 24 hours/day Type of Home: House Home Access: Stairs to enter [employer] of Steps: 4 Entrance Stairs-Rails: Can reach both Home Layout: One level Bathroom Shower/Tub: Engineer, manufacturing systems: Standard Bathroom Accessibility: Yes How Accessible: Accessible via walker Home Adaptive Equipment: Bedside commode/3-in-1;Shower chair with back;Walker - rolling Prior Function Level of Independence: Independent with assistive device(s) Able to Take Stairs?: Yes Driving: No Vocation: On disability Comments: Pt had L TKA 01/27/12. Communication Communication: No difficulties Dominant Hand: Right    Cognition  Overall  Cognitive Status: Appears within functional limits for tasks assessed/performed Arousal/Alertness: Awake/alert Orientation Level: Appears intact for tasks assessed Behavior During Session: [facility] for tasks performed Cognition - Other Comments: verbose    Extremity/Trunk Assessment Right Upper  Extremity Assessment RUE ROM/Strength/Tone: WFL for tasks assessed RUE Sensation: WFL - Light Touch;WFL - Proprioception RUE Coordination: WFL - gross/fine motor Left Upper Extremity Assessment LUE ROM/Strength/Tone: WFL for tasks assessed LUE Sensation: WFL - Light Touch;WFL - Proprioception LUE Coordination: WFL - gross/fine motor Right Lower Extremity Assessment RLE ROM/Strength/Tone: Deficits;Due to pain;Unable to fully assess RLE ROM/Strength/Tone Deficits: kept in KI throughout session, able to weight bear during stance of gait without buckling, did not fully assess strength but able to DF/PF [facility] RLE Sensation: WFL - Light Touch;WFL - Proprioception Left Lower Extremity Assessment LLE ROM/Strength/Tone: WFL for tasks assessed LLE Sensation: WFL - Light Touch;WFL - Proprioception LLE Coordination: WFL - gross/fine motor Trunk Assessment Trunk Assessment: Normal   Mobility Bed Mobility Supine to Sit: 4: Min assist;HOB elevated (30 degrees) Details for Bed Mobility Assistance: sequencing cues for hip and trunk movement to side of bed, minA to maneuver RLE off bed Transfers Sit to Stand: 4: Min assist;From toilet;From bed Stand to Sit: 4: Min assist;To toilet;To chair/3-in-1 Details for Transfer Assistance: cues for safe hand placement and min facilitation for follow through to stand   Exercise    Balance    End of Session OT - End of Session Equipment Utilized During Treatment: Gait belt;Right knee immobilizer Activity Tolerance: Patient tolerated treatment well Patient left: in chair;with call bell/phone within reach Nurse Communication: Mobility status  GO    04/10/2012 Cipriano Mile OTR/L Pager [phone] Office [phone]   Cipriano Mile 04/10/2012, 1:18 PM

## 2012-04-11 DIAGNOSIS — T8131XA Disruption of external operation (surgical) wound, not elsewhere classified, initial encounter: Secondary | ICD-10-CM | POA: Diagnosis present

## 2012-04-11 LAB — WOUND CULTURE
Culture: NO GROWTH
Gram Stain: NONE SEEN

## 2012-04-11 LAB — CBC
HCT: 28.3 % — ABNORMAL LOW (ref 36.0–46.0)
Hemoglobin: 8.9 g/dL — ABNORMAL LOW (ref 12.0–15.0)
MCH: 23.9 pg — ABNORMAL LOW (ref 26.0–34.0)
MCHC: 31.4 g/dL (ref 30.0–36.0)
MCV: 75.9 fL — ABNORMAL LOW (ref 78.0–100.0)
Platelets: 298 10*3/uL (ref 150–400)
RBC: 3.73 MIL/uL — ABNORMAL LOW (ref 3.87–5.11)
RDW: 17.4 % — ABNORMAL HIGH (ref 11.5–15.5)
WBC: 14.4 10*3/uL — ABNORMAL HIGH (ref 4.0–10.5)

## 2012-04-11 NOTE — Progress Notes (Signed)
Pt. Discharged to home accompanied by family.  Rx for Keflex given and explained.  Pt. Instructed to see Dr. Thurston Hole on Monday April 13, 2012. Copy of written instructions given and explained.  Pt. Will receive home health care.

## 2012-04-11 NOTE — Progress Notes (Signed)
Physical Therapy Treatment Patient Details Name: [name] MRN: [id number] DOB: May 11, 1961 Today's Date: 04/11/2012 Time: 8413-2440 PT Time Calculation (min): 48 min  PT Assessment / Plan / Recommendation Comments on Treatment Session  Pt making good progress.  Pt to dc home today.  Pt understands to keep knee immobilizer on at home.    Follow Up Recommendations  Home health PT;Supervision for mobility/OOB    Barriers to Discharge        Equipment Recommendations  None recommended by PT    Recommendations for Other Services    Frequency Min 5X/week   Plan Discharge plan remains appropriate;Frequency remains appropriate    Precautions / Restrictions Precautions Precautions: Knee;Fall Required Braces or Orthoses: Knee Immobilizer - Right Knee Immobilizer - Right: On at all times Restrictions Weight Bearing Restrictions: Yes RLE Weight Bearing: Weight bearing as tolerated   Pertinent Vitals/Pain 6/10 rt knee pain    Mobility  Bed Mobility Bed Mobility: Sit to Supine;Supine to Sit;Sitting - Scoot to Edge of Bed Supine to Sit: HOB elevated;With rails;4: Min guard (HOB 40) Sitting - Scoot to Edge of Bed: 5: Supervision Sit to Supine: 4: Min guard;With rail;HOB elevated (HOB 60) Details for Bed Mobility Assistance: Pt performed sit <>supine in order for therapist to re-adjust knee immobilizer.  Min guard assist to support LLE in/out of bed. Transfers Sit to Stand: 4: Min guard;With upper extremity assist;From chair/3-in-1 Stand to Sit: 5: Supervision;To chair/3-in-1;With armrests;With upper extremity assist Details for Transfer Assistance: VC for safe hand placment and sequencing for safe and controlled descent. Ambulation/Gait Ambulation/Gait Assistance: 4: Min guard;5: Supervision Ambulation Distance (Feet): 100 Feet Assistive device: Rolling walker Ambulation/Gait Assistance Details: Initial min guard and then progessed to supervision Gait Pattern: Decreased hip/knee  flexion - right;Decreased stance time - right (due to knee immobilizer) Stairs Assistance: 4: Min guard Stairs Assistance Details (indicate cue type and reason): No cuing needed.  Pt able to recall proper sequence from last admission Stair Management Technique: Two rails;Step to pattern;Forwards Number of Stairs: 3     Exercises     PT Diagnosis:    PT Problem List:   PT Treatment Interventions:     PT Goals Acute Rehab PT Goals PT Goal: Supine/Side to Sit - Progress: Progressing toward goal PT Goal: Sit to Supine/Side - Progress: Progressing toward goal PT Goal: Sit to Stand - Progress: Progressing toward goal PT Goal: Ambulate - Progress: Progressing toward goal PT Goal: Up/Down Stairs - Progress: Met  Visit Information  Last PT Received On: 04/11/12 Assistance Needed: +1    Subjective Data  Subjective: Pt stated she was going home today.   Cognition  Overall Cognitive Status: Appears within functional limits for tasks assessed/performed Arousal/Alertness: Awake/alert Orientation Level: Appears intact for tasks assessed Behavior During Session: [facility] for tasks performed    Balance  Balance Balance Assessed: Yes Dynamic Standing Balance Dynamic Standing - Balance Support: No upper extremity supported;During functional activity Dynamic Standing - Level of Assistance: 5: Stand by assistance (for safety) Dynamic Standing - Balance Activities: Reaching for objects;Forward lean/weight shifting;Other (comment) (performing grooming tasks at sink) Dynamic Standing - Comments: ~4 min  End of Session PT - End of Session Equipment Utilized During Treatment: Gait belt;Right knee immobilizer Activity Tolerance: Patient tolerated treatment well Patient left: in chair;with call bell/phone within reach Nurse Communication: Mobility status   GP     Saran Laviolette 04/11/2012, 11:06 AM  Skip Mayer PT [phone]

## 2012-04-11 NOTE — Progress Notes (Signed)
Occupational Therapy Treatment Patient Details Name: [name] MRN: [id number] DOB: 1960-11-20 Today's Date: 04/11/2012 Time: 0943-1000 OT Time Calculation (min): 17 min  OT Assessment / Plan / Recommendation Comments on Treatment Session Pt progressing toward goals and demonstrating increased activity tolerance today.      Follow Up Recommendations  Home health OT;Supervision/Assistance - 24 hour    Barriers to Discharge       Equipment Recommendations  None recommended by OT    Recommendations for Other Services    Frequency Min 2X/week   Plan Discharge plan remains appropriate    Precautions / Restrictions Precautions Precautions: Knee;Fall Required Braces or Orthoses: Knee Immobilizer - Right Knee Immobilizer - Right: On at all times Restrictions Weight Bearing Restrictions: Yes RLE Weight Bearing: Weight bearing as tolerated   Pertinent Vitals/Pain See vitals    ADL  Grooming: Performed;Wash/dry face;Teeth care;Modified independent Where Assessed - Grooming: Unsupported standing Toilet Transfer: Minimal assistance;Simulated Toilet Transfer Method: Sit to stand (ambulating) Acupuncturist: Other (comment) (EOB) Equipment Used: Gait belt;Rolling walker;Knee Immobilizer Transfers/Ambulation Related to ADLs: min guard with RW.   ADL Comments: Pt ambulated to sink with min guard assist and RW.  Performed grooming ADLs with mod I.  Discussed with pt techniques for LB ADLs.  Pt reports that she has a long handled sponge that she uses when sitting to bathe.     OT Diagnosis:    OT Problem List:   OT Treatment Interventions:     OT Goals ADL Goals Pt Will Perform Grooming: with set-up;Unsupported;Standing at sink ADL Goal: Grooming - Progress: Met Pt Will Transfer to Toilet: with supervision;Ambulation;with DME;3-in-1 ADL Goal: Toilet Transfer - Progress: Progressing toward goals Miscellaneous OT Goals Miscellaneous OT Goal #1: Pt will be Independent  in/OOB for BADLs OT Goal: Miscellaneous Goal #1 - Progress: Progressing toward goals  Visit Information  Last OT Received On: 04/11/12 Assistance Needed: +1    Subjective Data      Prior Functioning       Cognition  Overall Cognitive Status: Appears within functional limits for tasks assessed/performed Arousal/Alertness: Awake/alert Orientation Level: Appears intact for tasks assessed Behavior During Session: [facility] for tasks performed    Mobility Bed Mobility Bed Mobility: Sit to Supine;Supine to Sit;Sitting - Scoot to Edge of Bed Supine to Sit: HOB elevated;With rails;4: Min guard (HOB 40) Sitting - Scoot to Edge of Bed: 5: Supervision Sit to Supine: 4: Min guard;With rail;HOB elevated (HOB 60) Details for Bed Mobility Assistance: Pt performed sit <>supine in order for therapist to re-adjust knee immobilizer.  Min guard assist to support LLE in/out of bed. Transfers Transfers: Stand to Sit;Sit to Stand Sit to Stand: 4: Min assist;From bed;With armrests;With upper extremity assist Stand to Sit: 5: Supervision;To chair/3-in-1;With armrests;With upper extremity assist Details for Transfer Assistance: VC for safe hand placment and sequencing for safe and controlled descent.   Exercises    Balance Balance Balance Assessed: Yes Dynamic Standing Balance Dynamic Standing - Balance Support: No upper extremity supported;During functional activity Dynamic Standing - Level of Assistance: 5: Stand by assistance (for safety) Dynamic Standing - Balance Activities: Reaching for objects;Forward lean/weight shifting;Other (comment) (performing grooming tasks at sink) Dynamic Standing - Comments: ~4 min  End of Session OT - End of Session Equipment Utilized During Treatment: Gait belt;Right knee immobilizer Activity Tolerance: Patient tolerated treatment well Patient left: Other (comment) (with PT in w/c) Nurse Communication: Mobility status  GO    04/11/2012 Cipriano Mile  OTR/L Pager [phone]  Office [phone]  Cipriano Mile 04/11/2012, 10:18 AM

## 2012-04-11 NOTE — Discharge Summary (Signed)
Physician Discharge Summary  Patient ID: [name] MRN: [id number] DOB/AGE: [birth year] 50 y.o.  Admit date: 04/08/2012 Discharge date: 04/11/2012  Admission Diagnoses: Postoperative wound disruption, skin and capsule, secondary to traumatic fall.  Discharge Diagnoses:  Principal Problem:  *Wound disruption, post-op, skin, and capsule   Past Medical History  Diagnosis Date  . Bronchitis   . Jones fracture     left foot fifth metatarsal  . Fibromyalgia   . Varicose vein     protrudes above skin-per pt;vein popped and bruised;ultrasound done to make sure that there were no clots;noclots were found  . Chronic back pain   . Multiple allergies   . Asthma     but pt states not severe enough to even have an inhaler  . Shortness of breath     with exertion;pt states that its related to her weight  . Hyperthyroidism     but pt doesn't take anything for it;thyroid nodules  . Headache   . Dizziness     rarely  . Impaired memory   . Joint pain   . Joint swelling   . GERD (gastroesophageal reflux disease)     doesn't take anything for it  . IBS (irritable bowel syndrome)   . Arthritis     bilateral knees  . Bruising     pt states unexplained d/t fibromyalgia  . Urinary frequency   . Increased thirst   . Anemia   . Anxiety     takes Ativan and Valium  . Depression     from Fibromyalgia diagnosis  . Insomnia     takes Ambien  . Sore gums     this is why pt is on Amoxil-only takes for dental work  . Hypertension     recently diagnosed (this month)  . Left knee DJD   . Tachycardia     Surgeries: Procedure(s): IRRIGATION AND DEBRIDEMENT KNEE on 04/08/2012 - 04/09/2012 with capsular closure and skin repair.   Consultants (if any):    Discharged Condition: Improved  Hospital Course: [name] is an 50 y.o. female who was admitted 04/08/2012 with a diagnosis of Wound disruption, post-op, skin and went to the operating room on 04/08/2012 - 04/09/2012 and underwent the  above named procedures.    She was given perioperative antibiotics:  Anti-infectives     Start     Dose/Rate Route Frequency Ordered Stop   04/10/12 1400   cephALEXin (KEFLEX) capsule 500 mg  Status:  Discontinued        500 mg Oral 4 times daily 04/10/12 1232 04/10/12 1241   04/10/12 1330   cephALEXin (KEFLEX) capsule 500 mg        500 mg Oral 4 times per day 04/10/12 1241     04/10/12 0730   ceFAZolin (ANCEF) IVPB 2 g/50 mL premix  Status:  Discontinued        2 g 100 mL/hr over 30 Minutes Intravenous 3 times per day 04/10/12 0724 04/10/12 1232   04/10/12 0000   cephALEXin (KEFLEX) 500 MG capsule        500 mg Oral 4 times daily 04/10/12 0725 04/20/12 2359   04/09/12 1700   ceFAZolin (ANCEF) IVPB 2 g/50 mL premix        2 g 100 mL/hr over 30 Minutes Intravenous Every 6 hours 04/09/12 1401 04/09/12 2249   04/09/12 0002   ceFAZolin (ANCEF) IVPB 1 g/50 mL premix  Status:  Discontinued  1 g 100 mL/hr over 30 Minutes Intravenous 4 times per day 04/09/12 0002 04/09/12 1401        .  She was given sequential compression devices, early ambulation, and Lovenox for DVT prophylaxis. She had intraoperative cultures taken which were negative.  She benefited maximally from the hospital stay and there were no complications.  During the discharge process, she indicated to me that she did not have any prescriptions for any pain medications or anticoagulation, and wanted additional prescriptions.  Upon investigation, apparently she was actually given OxyContin 20 mg, dispensing 30 tablets, and 100 tablets of oxycodone immediate release, as well as Lovenox, and additional prescriptions were not given. I did prescribe Keflex per the request from Dr. Thurston Hole.  Recent vital signs:  Filed Vitals:   04/11/12 0520  BP: 130/65  Pulse: 97  Temp: 98.8 F (37.1 C)  Resp: 18    Recent laboratory studies:  Lab Results  Component Value Date   HGB 8.9* 04/11/2012   HGB 9.2* 04/10/2012   HGB  9.0* 04/09/2012   Lab Results  Component Value Date   WBC 14.4* 04/11/2012   PLT 298 04/11/2012   Lab Results  Component Value Date   INR 0.92 04/09/2012   Lab Results  Component Value Date   NA 142 04/10/2012   K 4.0 04/10/2012   CL 106 04/10/2012   CO2 25 04/10/2012   BUN 10 04/10/2012   CREATININE 0.63 04/10/2012   GLUCOSE 87 04/10/2012    Discharge Medications:   Medication List  As of 04/11/2012 10:13 AM   STOP taking these medications         celecoxib 200 MG capsule      moxifloxacin 400 MG tablet         TAKE these medications         ATIVAN 1 MG tablet   Generic drug: LORazepam   Take 1 mg by mouth 2 (two) times daily as needed. For anxiety attacks      BENADRYL PO   Take 2 capsules by mouth 1 day or 1 dose. In case she comes in contact with shellfish or shrimp      calcium-vitamin D 500-200 MG-UNIT per tablet   Commonly known as: OSCAL WITH D   Take 1 tablet by mouth daily.      cephALEXin 500 MG capsule   Commonly known as: KEFLEX   Take 1 capsule (500 mg total) by mouth 4 (four) times daily.      cyclobenzaprine 10 MG tablet   Commonly known as: FLEXERIL   Take 10 mg by mouth 3 (three) times daily. Muscle relaxer      diazepam 10 MG tablet   Commonly known as: VALIUM   Take 10 mg by mouth every 12 (twelve) hours. Schedule doses to reduce muscle spasms      diclofenac sodium 1 % Gel   Commonly known as: VOLTAREN   Apply 1 application topically 2 (two) times daily as needed. For right shoulder and arm and legs      enoxaparin 30 MG/0.3ML injection   Commonly known as: LOVENOX   Inject 0.3 mLs (30 mg total) into the skin every 12 (twelve) hours.      gabapentin 300 MG capsule   Commonly known as: NEURONTIN   Take 300 mg by mouth 3 (three) times daily.      hydrochlorothiazide 25 MG tablet   Commonly known as: HYDRODIURIL   Take 25 mg by mouth daily.  One tablet by mouth in the morning.      oxyCODONE 5 MG immediate release tablet   Commonly known as: Oxy  IR/ROXICODONE   1-2 tab every 4-6 hrs as needed for breakthrough pain      oxyCODONE 20 MG 12 hr tablet   Commonly known as: OXYCONTIN   Take 20 mg by mouth every 12 (twelve) hours as needed. pain      potassium chloride 10 MEQ tablet   Commonly known as: K-DUR,KLOR-CON   Take 10 mEq by mouth 2 (two) times daily.      STOOL SOFTENER PO   Take 1 tablet by mouth daily.      zolpidem 10 MG tablet   Commonly known as: AMBIEN   Take 10 mg by mouth at bedtime as needed. For sleep            Diagnostic Studies: Dg Chest 2 View  03/31/2012  *RADIOLOGY REPORT*  Clinical Data: Total knee arthroplasty.  Preop.  Asthma.  Shortness of breath.  Hypertension.  CHEST - 2 VIEW  Comparison: 09/20/2011  Findings: Lateral view degraded by patient arm position.  Lower cervical spine fixation. Midline trachea.  Normal heart size and mediastinal contours.  Minimal convex right thoracic spine curvature. Clear lungs.  IMPRESSION: No acute cardiopulmonary disease.  Original Report Authenticated By: Consuello Bossier, M.D.   Dg Knee 4 Views W/patella Right  04/07/2012  *RADIOLOGY REPORT*  Clinical Data: Recent right total knee arthroplasty.  Twisted knee today wall ambulating.  RIGHT KNEE - COMPLETE 4+ VIEW  Comparison: None.  Findings: Postoperative changes of recent right total knee arthroplasty.  Expected postsurgical changes in the soft tissues. Medial and lateral compartment joint spaces appear preserved. Polyethylene liner cannot be well visualized on the lateral view due to overlapping densities.  Based on the frontal view, it is likely intact and located.  Patella appears appropriately located.  IMPRESSION: Right total knee arthroplasty with expected postoperative changes in the soft tissues.  No complicating features.  Original Report Authenticated By: Andreas Newport, M.D.    Disposition: 01-Home or Self Care  Discharge Orders    Future Orders Please Complete By Expires   Diet general      Diet general       Call MD / Call 911      Comments:   If you experience chest pain or shortness of breath, CALL 911 and be transported to the hospital emergency room.  If you develope a fever above 101 F, pus (white drainage) or increased drainage or redness at the wound, or calf pain, call your surgeon's office.   Discharge instructions      Comments:   Change dressing in 3 days and reapply fresh dressing, unless you have a splint (half cast).  If you have a splint/cast, just leave in place until your follow-up appointment.    Keep wounds dry for 3 weeks.  Leave steri-strips in place on skin.  Do not apply lotion or anything to the wound.   Constipation Prevention      Comments:   Drink plenty of fluids.  Prune juice may be helpful.  You may use a stool softener, such as Colace (over the counter) 100 mg twice a day.  Use MiraLax (over the counter) for constipation as needed.   Call MD / Call 911      Comments:   If you experience chest pain or shortness of breath, CALL 911 and be transported to the hospital emergency  room.  If you develope a fever above 101 F, pus (white drainage) or increased drainage or redness at the wound, or calf pain, call your surgeon's office.   Discharge instructions      Comments:   Change dressing in 3 days and reapply fresh dressing, unless you have a splint (half cast).  If you have a splint/cast, just leave in place until your follow-up appointment.    Keep wounds dry for 3 weeks.  Leave steri-strips in place on skin.  Do not apply lotion or anything to the wound.   Constipation Prevention      Comments:   Drink plenty of fluids.  Prune juice may be helpful.  You may use a stool softener, such as Colace (over the counter) 100 mg twice a day.  Use MiraLax (over the counter) for constipation as needed.   TED hose      Comments:   Use stockings (TED hose) for 2 weeks on both leg(s).  You may remove them at night for sleeping.   Change dressing      Comments:   Change  dressing in three days, then change the dressing daily with sterile 4 x 4 inch gauze dressing.  You may clean the incision with alcohol prior to redressing.   Do not put a pillow under the knee. Place it under the heel.         Follow-up Information    Follow up with Nilda Simmer, MD in 1 week.   Contact information:   [facility] [address], [address] [place] [address] [phone]           Signed: Eulas Post 04/11/2012, 10:13 AM

## 2012-04-12 LAB — WOUND CULTURE
Culture: NO GROWTH
Gram Stain: NONE SEEN

## 2012-04-14 LAB — ANAEROBIC CULTURE: Gram Stain: NONE SEEN

## 2013-02-17 DIAGNOSIS — Z0279 Encounter for issue of other medical certificate: Secondary | ICD-10-CM

## 2013-07-21 ENCOUNTER — Other Ambulatory Visit: Payer: Self-pay | Admitting: Neurosurgery

## 2013-07-21 DIAGNOSIS — M542 Cervicalgia: Secondary | ICD-10-CM

## 2013-07-22 ENCOUNTER — Ambulatory Visit
Admission: RE | Admit: 2013-07-22 | Discharge: 2013-07-22 | Disposition: A | Discharge status: Home / Self Care | Payer: Commercial Managed Care - HMO | Source: Ambulatory Visit | Attending: Neurosurgery | Admitting: Neurosurgery

## 2013-07-22 DIAGNOSIS — M542 Cervicalgia: Secondary | ICD-10-CM

## 2013-11-02 ENCOUNTER — Other Ambulatory Visit: Payer: Self-pay | Admitting: Orthopedic Surgery

## 2013-11-02 DIAGNOSIS — M25511 Pain in right shoulder: Secondary | ICD-10-CM

## 2013-11-06 ENCOUNTER — Ambulatory Visit
Admission: RE | Admit: 2013-11-06 | Discharge: 2013-11-06 | Disposition: A | Discharge status: Home / Self Care | Payer: Medicare Other | Source: Ambulatory Visit | Attending: Orthopedic Surgery | Admitting: Orthopedic Surgery

## 2013-11-06 DIAGNOSIS — M25511 Pain in right shoulder: Secondary | ICD-10-CM

## 2013-12-01 ENCOUNTER — Ambulatory Visit (INDEPENDENT_AMBULATORY_CARE_PROVIDER_SITE_OTHER): Payer: Commercial Managed Care - HMO | Admitting: Family Medicine

## 2013-12-01 ENCOUNTER — Encounter: Payer: Self-pay | Admitting: Family Medicine

## 2013-12-01 VITALS — BP 145/82 | HR 96 | Ht 62.0 in | Wt 235.0 lb

## 2013-12-01 DIAGNOSIS — D649 Anemia, unspecified: Secondary | ICD-10-CM | POA: Insufficient documentation

## 2013-12-01 DIAGNOSIS — M25511 Pain in right shoulder: Secondary | ICD-10-CM

## 2013-12-01 DIAGNOSIS — F32A Depression, unspecified: Secondary | ICD-10-CM

## 2013-12-01 DIAGNOSIS — F3289 Other specified depressive episodes: Secondary | ICD-10-CM

## 2013-12-01 DIAGNOSIS — G8929 Other chronic pain: Secondary | ICD-10-CM | POA: Insufficient documentation

## 2013-12-01 DIAGNOSIS — M25519 Pain in unspecified shoulder: Secondary | ICD-10-CM

## 2013-12-01 DIAGNOSIS — J302 Other seasonal allergic rhinitis: Secondary | ICD-10-CM | POA: Insufficient documentation

## 2013-12-01 DIAGNOSIS — M549 Dorsalgia, unspecified: Secondary | ICD-10-CM

## 2013-12-01 DIAGNOSIS — F329 Major depressive disorder, single episode, unspecified: Secondary | ICD-10-CM

## 2013-12-01 DIAGNOSIS — E0590 Thyrotoxicosis, unspecified without thyrotoxic crisis or storm: Secondary | ICD-10-CM | POA: Insufficient documentation

## 2013-12-01 NOTE — Assessment & Plan Note (Signed)
I discussed with patient that depression would need full evaluation. I also discussed SSRI would be my treatment of choice over ativan/valium and that also SSRI would be first choice for anxiety. Would also consider therapy/CBT for anxiety. Discussed I did not think ativan/valium were best long term solutions for these diagnosis but would need to obtain medical records first.

## 2013-12-01 NOTE — Progress Notes (Signed)
Jill ConchStephen Hunter, MD Phone: [phone]  Subjective:  Patient presents today to establish care. Extensive medical history was reviewed and entered/edited in [facility].  Chief complaint-noted.   # Right shoulder pain Patient with known torn rotator cuff. Is supposed to have surgery with [name] but needs referral.  ROS- no fever/chills. No redness around the joint.   Depression/anxiety Long term issues. States only takes ativan/valium. [name] ambien for sleep. States has panic attacks ROS- no SI/HI.   The following were reviewed and entered/updated in epic: Patient Active Problem List   Diagnosis Date Noted  . Chronic back pain due to DJD with history surgery     Priority: Medium  . Hyperthyroidism     Priority: Medium  . Fibromyalgia     Priority: Medium  . Anxiety     Priority: Medium  . Depression     Priority: Medium  . Hypertension     Priority: Medium  . Seasonal allergies     Priority: Low  . Anemia     Priority: Low  . 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 Status post left total prosthetic replacement of knee joint using cement 03/24/2012    Priority: Low  . GERD (gastroesophageal reflux disease)     Priority: Low  . Lumbar degenerative disc disease 09/25/2011    Priority: Low    Past Medical History  Diagnosis Date  . Bronchitis   . Jones fracture     left foot fifth metatarsal  . Fibromyalgia     diagnosed 2001  . Varicose vein     protrudes above skin-per pt;vein popped and bruised;ultrasound done to make sure that there were no clots;noclots were found  . Chronic back pain     2012 tailbone surgery and 3 lower discs.   . Multiple allergies     including latex, pet dander, shellfish, pet dander  . Asthma     but pt states not severe enough to even have an inhaler  . Shortness of breath     with exertion;pt states that its related to her weight  . Hyperthyroidism     but pt doesn't take anything for  it;thyroid nodules  . Headache(784.0)     "sinus headaches"  . Dizziness     rarely  . Impaired memory     states from fibromyalgia  . GERD (gastroesophageal reflux disease)     Prilosec occasionally  . IBS (irritable bowel syndrome)   . Arthritis     bilateral knees s/p knee replacement bilaterally  . Bruising     pt states unexplained d/t fibromyalgia  . Anemia   . Anxiety     takes Ativan and Valium, after mother passed  . Depression     from Fibromyalgia diagnosis; not taking medicine. since 2001  . Insomnia     takes Ambien  . Sore gums     this is why pt is on Amoxil-only takes for dental work  . Hypertension     since 2013  . Tachycardia   . Seasonal allergies   . Anemia    Patient Active Problem List   Diagnosis Date Noted  . Chronic back pain due to DJD with history surgery     Priority: Medium  . Hyperthyroidism     Priority: Medium  . Fibromyalgia     Priority: Medium  . Anxiety     Priority: Medium  . Depression     Priority:  Medium  . Hypertension     Priority: Medium  . Seasonal allergies     Priority: Low  . Anemia     Priority: Low  . 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 Status post left total prosthetic replacement of knee joint using cement 03/24/2012    Priority: Low  . GERD (gastroesophageal reflux disease)     Priority: Low  . Lumbar degenerative disc disease 09/25/2011    Priority: Low   Past Surgical History  Procedure Laterality Date  . Cesarean section  1984  . Spine surgery  2004    Cervical plate, ACDF  . Knee surgery  2005    Left knee arthroscopy  . Nasal septoplasty w/ turbinoplasty  2007    due to recurrent sinusitis  . Tonsillectomy  2007  . Fracture surgery  05/08/2010    Jones fracture left foot fifth metatarsal  . Anterior lumbar fusion  09/20/2011    Procedure: ANTERIOR LUMBAR FUSION 1 LEVEL;  Surgeon: Carmela Hurt;  Location: [facility];  Service:  Neurosurgery;  Laterality: N/A;  Lumbar five-Sacral One Anterior Lumbar Interbody Fusion /Dr. Early to Approach   . Cervical disc surgery      WITH TITANIUM PLATE IN NECK---LEFT SIDE  . Total knee arthroplasty  01/27/2012    Procedure: TOTAL KNEE ARTHROPLASTY;  Surgeon: Nilda Simmer, MD;  Bilateral  . Steriod injection  01/27/2012    Procedure: STEROID INJECTION;  Surgeon: Nilda Simmer, MD;  Location: [facility] OR;  Service: Orthopedics;  Laterality: Right;  . Joint replacement  01-27-2012    left total knee  . Total knee arthroplasty  04/06/2012    Procedure: TOTAL KNEE ARTHROPLASTY;  Surgeon: Nilda Simmer, MD;  Location: [facility] OR;  Service: Orthopedics;  Laterality: Right;  . Irrigation and debridement knee  04/09/2012    Procedure: IRRIGATION AND DEBRIDEMENT KNEE;  Surgeon: Thera Flake., MD;  Location: [facility] OR;  Service: Orthopedics;  Laterality: Right;  . Uterine fibroid surgery      mid 200s    Medications- reviewed and updated Current Outpatient Prescriptions  Medication Sig Dispense Refill  . albuterol (PROVENTIL HFA;VENTOLIN HFA) 108 (90 BASE) MCG/ACT inhaler Inhale into the lungs every 6 (six) hours as needed for wheezing or shortness of breath.      . AMITIZA 24 MCG capsule       . aspirin 81 MG tablet Take 81 mg by mouth as needed for pain.      Marland Kitchen CELEBREX 200 MG capsule       . cyclobenzaprine (FLEXERIL) 10 MG tablet Take 10 mg by mouth 3 (three) times daily. Muscle relaxer      . diazepam (VALIUM) 10 MG tablet Take 10 mg by mouth every 12 (twelve) hours. Schedule doses to reduce muscle spasms      . diclofenac sodium (VOLTAREN) 1 % GEL Apply 1 application topically 2 (two) times daily as needed. For right shoulder and arm and legs      . DiphenhydrAMINE HCl (BENADRYL PO) Take 2 capsules by mouth 1 day or 1 dose. In case she comes in contact with shellfish or shrimp      . gabapentin (NEURONTIN) 300 MG capsule Take 600 mg by mouth 3 (three) times daily.       . hydrochlorothiazide  (HYDRODIURIL) 25 MG tablet Take 25 mg by mouth daily. One tablet by mouth in the morning.       Marland Kitchen  LORazepam (ATIVAN) 1 MG tablet Take 1 mg by mouth 2 (two) times daily as needed. For anxiety attacks      . omeprazole (PRILOSEC) 20 MG capsule Take 20 mg by mouth 2 (two) times daily before a meal.      . pregabalin (LYRICA) 75 MG capsule Take 75 mg by mouth 2 (two) times daily.      Marland Kitchen zolpidem (AMBIEN) 10 MG tablet Take 10 mg by mouth at bedtime as needed. For sleep      . calcium-vitamin D (OSCAL WITH D) 500-200 MG-UNIT per tablet Take 1 tablet by mouth daily.      Marland Kitchen HYDROcodone-acetaminophen (NORCO) 10-325 MG per tablet        No current facility-administered medications for this visit.    Allergies-reviewed and updated Allergies  Allergen Reactions  . Shellfish Allergy Anaphylaxis  . Latex Other (See Comments)    Itching/rash Latex glove with powder  . Other Other (See Comments)    MSG-    History   Social History  . Marital Status: Divorced    Spouse Name: N/A    Number of Children: N/A  . Years of Education: N/A   Social History Main Topics  . Smoking status: Former Smoker -- 0.50 packs/day for 10 years    Quit date: 12/06/1998  . Smokeless tobacco: Never Used  . Alcohol Use: No  . Drug Use: No  . Sexual Activity: No   Other Topics Concern  . None   Social History Narrative   Divorced. 1 son.    Disabled from fibromyalgia, arthritis, back and knee surgeries.    Graduated from High school.       Sister is a patient in our clinic, [name].    Recently cared for at [facility]. Changed to [place] as new network.    ROS--See HPI, otherwise ROS was negative except for various aches and pains which patient attributes to fibromyalgia.  Objective: BP 145/82  Pulse 96  Ht 5\' 2"  (1.575 m)  Wt 235 lb (106.595 kg)  BMI 42.97 kg/m2  LMP 02/10/2012 Gen: NAD, resting comfortably on table Eyes: PERRLA, weraing colored contacts HEENT: Mucous membranes are  moist. Oropharynx normal. TM normal.  CV: RRR no murmurs rubs or gallops Lungs: CTAB no crackles, wheeze, rhonchi Abdomen: soft/nontender/nondistended/normal bowel sounds. No rebound or guarding.  Ext: no edema Skin: warm, dry Neuro: grossly normal,  5/5 strength all extremities  Assessment/Plan:  Right shoulder pain with reported torn rotator cuff Referred patient back to Dr. Thurston Hole for reported upcoming shoulder surgery  Depression I discussed with patient that depression would need full evaluation. I also discussed SSRI would be my treatment of choice over ativan/valium and that also SSRI would be first choice for anxiety. Would also consider therapy/CBT for anxiety. Discussed I did not think ativan/valium were best long term solutions for these diagnosis but would need to obtain medical records first.

## 2013-12-01 NOTE — Patient Instructions (Signed)
Dear [name],   It was great to see you again today. We are excited to have you as our patient now! Please read below regarding the issues that we discussed.   1. Thanks for going through your medical history with me. We are going to try to get some more records from your previous physician.  2. I will refer you to Dr. Thurston HoleWainer for your surgery.   Please follow up in clinic at your convenience for a well woman exam. We will need separate visits to discuss pain management as well as depression so schedule a visit for these as well before you run out of medicine.  Please call earlier if you have any questions or concerns.   Sincerely,  Dr. Tana ConchStephen Donovan [place]chel   My 5 to Fitness! These are tips I give to every patient that  are important for living a healthy life!   5: fruits and vegetables per day (work on 9 per day if you are at 5) 4: exercise 4-5 times per week for at least 30 minutes (walking counts!) 3: meals per day (don't skip breakfast!) 2: habits to quit -smoking -excess alcohol use (men >2 beer/day; women >1beer/day) 1: sweet per day (2 cookies, 1 small cup of ice cream, 12 oz soda)  These are general tips for healthy living. Try to start with 1 or 2 habit TODAY and make it a part of your life for several months. Once you have 1 or 2 habits down for several months, try to begin working on your next healthy habit. With every single step you take, you will be leading a healthier lifestyle!  We need to get you uptodate on the following: Health Maintenance Due  Topic Date Due  . Pap Smear  08/02/1980  . Tetanus/tdap  08/02/1981  . Mammogram  08/02/2012  . Colonoscopy  08/02/2012

## 2013-12-07 ENCOUNTER — Telehealth: Payer: Self-pay | Admitting: Family Medicine

## 2013-12-07 ENCOUNTER — Other Ambulatory Visit: Payer: Self-pay | Admitting: Family Medicine

## 2013-12-07 NOTE — Telephone Encounter (Signed)
Pt called and wanted refill on her pain medications and Valium. She is a new patient and I explained that on a new patient visit we do not prescribed controlled medications. She said if she had known that she would have not came here. She has a second appointment on 3/19 and I told her to discuss with her doctor at that time but that I would send a message to let him know that she would like to have them on that visit. jw

## 2013-12-07 NOTE — Telephone Encounter (Signed)
In total, I told patient she would need 3 visits. I told patient we would discuss therapy for depression and pain but did not guarantee this regimen would include valium and narcotic pain medication. Please make sure patient knows she needs 3 visits to address her concerns and if valium and pain is pressing should go ahead and schedule 2 visits to discuss each of these.   As per AVS:  Please follow up in clinic at your convenience for a well woman exam. We will need separate visits to discuss pain management as well as depression so schedule a visit for these as well before you run out of medicine. Please call earlier if you have any questions or concerns.

## 2013-12-22 ENCOUNTER — Ambulatory Visit (INDEPENDENT_AMBULATORY_CARE_PROVIDER_SITE_OTHER): Payer: Commercial Managed Care - HMO | Admitting: Family Medicine

## 2013-12-22 ENCOUNTER — Emergency Department (HOSPITAL_COMMUNITY)
Admission: EM | Admit: 2013-12-22 | Discharge: 2013-12-22 | Disposition: A | Discharge status: Home / Self Care | Payer: Medicare HMO | Attending: Emergency Medicine | Admitting: Emergency Medicine

## 2013-12-22 ENCOUNTER — Telehealth: Payer: Self-pay | Admitting: Family Medicine

## 2013-12-22 ENCOUNTER — Encounter (HOSPITAL_COMMUNITY): Payer: Self-pay | Admitting: Emergency Medicine

## 2013-12-22 VITALS — BP 157/94 | HR 126 | Temp 97.8°F | Ht 62.0 in | Wt 237.3 lb

## 2013-12-22 DIAGNOSIS — Z91040 Latex allergy status: Secondary | ICD-10-CM | POA: Insufficient documentation

## 2013-12-22 DIAGNOSIS — F411 Generalized anxiety disorder: Secondary | ICD-10-CM | POA: Insufficient documentation

## 2013-12-22 DIAGNOSIS — Z87891 Personal history of nicotine dependence: Secondary | ICD-10-CM | POA: Insufficient documentation

## 2013-12-22 DIAGNOSIS — Z8781 Personal history of (healed) traumatic fracture: Secondary | ICD-10-CM | POA: Insufficient documentation

## 2013-12-22 DIAGNOSIS — F329 Major depressive disorder, single episode, unspecified: Secondary | ICD-10-CM | POA: Insufficient documentation

## 2013-12-22 DIAGNOSIS — J45909 Unspecified asthma, uncomplicated: Secondary | ICD-10-CM | POA: Insufficient documentation

## 2013-12-22 DIAGNOSIS — IMO0002 Reserved for concepts with insufficient information to code with codable children: Secondary | ICD-10-CM

## 2013-12-22 DIAGNOSIS — Z862 Personal history of diseases of the blood and blood-forming organs and certain disorders involving the immune mechanism: Secondary | ICD-10-CM | POA: Insufficient documentation

## 2013-12-22 DIAGNOSIS — Z9889 Other specified postprocedural states: Secondary | ICD-10-CM | POA: Insufficient documentation

## 2013-12-22 DIAGNOSIS — M7989 Other specified soft tissue disorders: Secondary | ICD-10-CM | POA: Insufficient documentation

## 2013-12-22 DIAGNOSIS — K219 Gastro-esophageal reflux disease without esophagitis: Secondary | ICD-10-CM | POA: Insufficient documentation

## 2013-12-22 DIAGNOSIS — M79609 Pain in unspecified limb: Secondary | ICD-10-CM | POA: Insufficient documentation

## 2013-12-22 DIAGNOSIS — I10 Essential (primary) hypertension: Secondary | ICD-10-CM | POA: Insufficient documentation

## 2013-12-22 DIAGNOSIS — M1710 Unilateral primary osteoarthritis, unspecified knee: Secondary | ICD-10-CM | POA: Insufficient documentation

## 2013-12-22 DIAGNOSIS — G4700 Insomnia, unspecified: Secondary | ICD-10-CM | POA: Insufficient documentation

## 2013-12-22 DIAGNOSIS — Z8639 Personal history of other endocrine, nutritional and metabolic disease: Secondary | ICD-10-CM | POA: Insufficient documentation

## 2013-12-22 DIAGNOSIS — F3289 Other specified depressive episodes: Secondary | ICD-10-CM | POA: Insufficient documentation

## 2013-12-22 DIAGNOSIS — R609 Edema, unspecified: Secondary | ICD-10-CM | POA: Insufficient documentation

## 2013-12-22 DIAGNOSIS — G8929 Other chronic pain: Secondary | ICD-10-CM | POA: Insufficient documentation

## 2013-12-22 DIAGNOSIS — Z79899 Other long term (current) drug therapy: Secondary | ICD-10-CM | POA: Insufficient documentation

## 2013-12-22 LAB — D-DIMER, QUANTITATIVE: D-Dimer, Quant: 3.26 ug/mL-FEU — ABNORMAL HIGH (ref 0.00–0.48)

## 2013-12-22 MED ORDER — FUROSEMIDE 10 MG/ML IJ SOLN
40.0000 mg | Freq: Once | INTRAMUSCULAR | Status: AC
  Administered 2013-12-22: 40 mg via INTRAMUSCULAR

## 2013-12-22 MED ORDER — IBUPROFEN 800 MG PO TABS
800.0000 mg | ORAL_TABLET | Freq: Once | ORAL | Status: AC
  Administered 2013-12-22: 800 mg via ORAL
  Filled 2013-12-22: qty 1

## 2013-12-22 NOTE — Assessment & Plan Note (Signed)
B/L swelling with pain more on the left. Index of suspicion for DVT with DVT wells criteria of 2. Due to recent surgery and hx of immobilization I recommended r/o DVT with D-Dimer,if positive I will send for U/S. IM Lasix 40mg  given x 1 to help with swelling. She is instructed to continue HCTZ 25 qd for continuous diuresis. Compression stocking recommended with leg elevation when seated. I recommended follow up soon or to ED if symptom worsens. Patient verbalized understanding.

## 2013-12-22 NOTE — ED Provider Notes (Signed)
CSN: [id number]     Arrival date & time 12/22/13  1607 History   First MD Initiated Contact with Patient 12/22/13 2014     Chief Complaint  Patient presents with  . Leg Pain  . Leg Swelling     (Consider location/radiation/quality/duration/timing/severity/associated sxs/prior Treatment) HPI  53 year old female with history of fibromyalgia, chronic back pain, varicose veins, recent rotator cuff surgery 2 weeks ago presents with complaints of left lower leg pain. Patient has been experiencing leg pain swelling ongoing for several days. She initially was seen by her PCP for this complaint. She was noted to have an elevated d-dimer. She was giving IM Lasix but also recommended to come to the ER to rule out DVT. She reports that she has swelling to both lower legs left greater than right. She has been wearing compressive stockings. She denies fever, chest pain, shortness of breath, hemoptysis, or rash. She reports chronic pain but that is not new. She has no other complaints. Since surgery, pt haven't ambulate much.    Past Medical History  Diagnosis Date  . Bronchitis   . Jones fracture     left foot fifth metatarsal  . Fibromyalgia     diagnosed 2001  . Varicose vein     protrudes above skin-per pt;vein popped and bruised;ultrasound done to make sure that there were no clots;noclots were found  . Chronic back pain     2012 tailbone surgery and 3 lower discs.   . Multiple allergies     including latex, pet dander, shellfish, pet dander  . Asthma     but pt states not severe enough to even have an inhaler  . Shortness of breath     with exertion;pt states that its related to her weight  . Hyperthyroidism     but pt doesn't take anything for it;thyroid nodules  . Headache(784.0)     "sinus headaches"  . Dizziness     rarely  . Impaired memory     states from fibromyalgia  . GERD (gastroesophageal reflux disease)     Prilosec occasionally  . IBS (irritable bowel syndrome)   .  Arthritis     bilateral knees s/p knee replacement bilaterally  . Bruising     pt states unexplained d/t fibromyalgia  . Anemia   . Anxiety     takes Ativan and Valium, after mother passed  . Depression     from Fibromyalgia diagnosis; not taking medicine. since 2001  . Insomnia     takes Ambien  . Sore gums     this is why pt is on Amoxil-only takes for dental work  . Hypertension     since 2013  . Tachycardia   . Seasonal allergies   . Anemia    Past Surgical History  Procedure Laterality Date  . Cesarean section  1984  . Spine surgery  2004    Cervical plate, ACDF  . Knee surgery  2005    Left knee arthroscopy  . Nasal septoplasty w/ turbinoplasty  2007    due to recurrent sinusitis  . Tonsillectomy  2007  . Fracture surgery  05/08/2010    Jones fracture left foot fifth metatarsal  . Anterior lumbar fusion  09/20/2011    Procedure: ANTERIOR LUMBAR FUSION 1 LEVEL;  Surgeon: Carmela Hurt;  Location: [facility];  Service: Neurosurgery;  Laterality: N/A;  Lumbar five-Sacral One Anterior Lumbar Interbody Fusion /Dr. Early to Approach   . Cervical disc surgery  WITH TITANIUM PLATE IN NECK---LEFT SIDE  . Total knee arthroplasty  01/27/2012    Procedure: TOTAL KNEE ARTHROPLASTY;  Surgeon: Nilda Simmer, MD;  Bilateral  . Steriod injection  01/27/2012    Procedure: STEROID INJECTION;  Surgeon: Nilda Simmer, MD;  Location: [facility] OR;  Service: Orthopedics;  Laterality: Right;  . Joint replacement  01-27-2012    left total knee  . Total knee arthroplasty  04/06/2012    Procedure: TOTAL KNEE ARTHROPLASTY;  Surgeon: Nilda Simmer, MD;  Location: [facility] OR;  Service: Orthopedics;  Laterality: Right;  . Irrigation and debridement knee  04/09/2012    Procedure: IRRIGATION AND DEBRIDEMENT KNEE;  Surgeon: Thera Flake., MD;  Location: [facility] OR;  Service: Orthopedics;  Laterality: Right;  . Uterine fibroid surgery      mid 200s   Family History  Problem Relation Age of Onset  . Stroke  Father     passed 2004 from pneumonia  . Anesthesia problems Neg Hx   . Hypotension Neg Hx   . Malignant hyperthermia Neg Hx   . Pseudochol deficiency Neg Hx   . Arthritis Sister   . Pulmonary embolism Mother     after minor knee surgery leading to DVT  . Alcohol abuse Father   . Depression Sister   . Hypertension Sister   . Diabetes      grandmother   History  Substance Use Topics  . Smoking status: Former Smoker -- 0.50 packs/day for 10 years    Quit date: 12/06/1998  . Smokeless tobacco: Never Used  . Alcohol Use: No   OB History   Grav Para Term Preterm Abortions TAB SAB Ect Mult Living                 Review of Systems  Respiratory: Negative for cough and shortness of breath.   Cardiovascular: Negative for chest pain.  Skin: Negative for rash and wound.  Neurological: Negative for numbness.  All other systems reviewed and are negative.      Allergies  Shellfish allergy; Latex; and Other  Home Medications   Current Outpatient Rx  Name  Route  Sig  Dispense  Refill  . albuterol (PROVENTIL HFA;VENTOLIN HFA) 108 (90 BASE) MCG/ACT inhaler   Inhalation   Inhale into the lungs every 6 (six) hours as needed for wheezing or shortness of breath.         Valinda Hoar 24 MCG capsule   Oral   Take 24 mcg by mouth 2 (two) times daily with a meal.          . aspirin 81 MG tablet   Oral   Take 81 mg by mouth as needed for pain.         . calcium-vitamin D (OSCAL WITH D) 500-200 MG-UNIT per tablet   Oral   Take 1 tablet by mouth daily.         . diazepam (VALIUM) 10 MG tablet   Oral   Take 10 mg by mouth every 12 (twelve) hours. Schedule doses to reduce muscle spasms         . diclofenac sodium (VOLTAREN) 1 % GEL   Topical   Apply 1 application topically 2 (two) times daily as needed (shoulder, arm and leg pain). For right shoulder and arm and legs         . DiphenhydrAMINE HCl (BENADRYL PO)   Oral   Take 2 capsules by mouth 1 day or 1 dose. In  case  she comes in contact with shellfish or shrimp         . gabapentin (NEURONTIN) 300 MG capsule   Oral   Take 600 mg by mouth 3 (three) times daily.          . hydrochlorothiazide (HYDRODIURIL) 25 MG tablet   Oral   Take 25 mg by mouth daily. One tablet by mouth in the morning.          Marland Kitchen. HYDROcodone-acetaminophen (NORCO) 10-325 MG per tablet   Oral   Take 1 tablet by mouth every 4 (four) hours as needed for moderate pain.          Marland Kitchen. LORazepam (ATIVAN) 1 MG tablet   Oral   Take 1 mg by mouth 2 (two) times daily as needed. For anxiety attacks         . omeprazole (PRILOSEC) 20 MG capsule   Oral   Take 20 mg by mouth 2 (two) times daily before a meal.         . pregabalin (LYRICA) 75 MG capsule   Oral   Take 75 mg by mouth 2 (two) times daily.         Marland Kitchen. zolpidem (AMBIEN) 10 MG tablet   Oral   Take 10 mg by mouth at bedtime as needed. For sleep          BP 141/91  Pulse 103  Temp(Src) 98.4 F (36.9 C) (Oral)  Resp 22  Ht 5\' 2"  (1.575 m)  Wt 237 lb (107.502 kg)  BMI 43.34 kg/m2  SpO2 99%  LMP 02/10/2012 Physical Exam  Nursing note and vitals reviewed. Constitutional: She is oriented to person, place, and time. She appears well-developed and well-nourished. No distress.  HENT:  Head: Atraumatic.  Eyes: Conjunctivae are normal.  Neck: Neck supple.  Cardiovascular: Normal rate, regular rhythm and intact distal pulses.   Pulmonary/Chest: Effort normal and breath sounds normal.  Abdominal: There is no tenderness.  Musculoskeletal: She exhibits edema (Trace pitting edema to bilateral lower extremities.). She exhibits no tenderness.  Neurological: She is alert and oriented to person, place, and time.  Skin: No rash noted.  Psychiatric: She has a normal mood and affect.    ED Course  Procedures (including critical care time)  8:27 PM Patient was sent here from PCP office to rule out DVT. Doppler ultrasound of left lower extremities without evidence of  acute DVT or any other acute finding. Initially patient was tachycardic with a heart rate of 126 however on recheck heart rate is at 100. She is hemodynamically stable with normal oxygenation and denies any shortness of breath. Patient agrees to followup closely with her PCP for further management. Return precautions discussed. She will continue to wear compression stocking and keeping her legs elevated.    Jill BullionNewton, [name] Female 1961/09/20 [id number]            Progress Notes by Gwendolyn FillMichelle A Simonetti at 12/22/2013 4:49 PM    Author: Lawrence MarseillesMichelle A Simonetti Service: Vascular Lab Author Type: Cardiovascular Sonographer   Filed: 12/22/2013 4:50 PM Note Time: 12/22/2013 4:49 PM Status: Signed   Editor: Lawrence MarseillesMichelle A Simonetti (Cardiovascular Sonographer)      *Preliminary Results*  Left lower extremity venous duplex completed.  Left lower extremity is negative for deep vein thrombosis. There is no evidence of left Baker's cyst.  12/22/2013 4:50 PM  Gertie FeyMichelle Simonetti, RVT, RDCS, RDMS      Labs Review Labs Reviewed - No data to display  Imaging Review No results found.   EKG Interpretation None      MDM   Final diagnoses:  Leg swelling    BP 161/95  Pulse 106  Temp(Src) 98.4 F (36.9 C) (Oral)  Resp 20  Ht 5\' 2"  (1.575 m)  Wt 237 lb (107.502 kg)  BMI 43.34 kg/m2  SpO2 97%  LMP 02/10/2012  [name] have reviewed nursing notes and vital signs. [name] personally reviewed the imaging tests through PACS system  [name] reviewed available ER/hospitalization records thought the EMR     Fayrene Helper, [place] 12/22/13 2035

## 2013-12-22 NOTE — ED Notes (Signed)
Pt presents to department for evaluation of L leg pain, states severe pain to L lower leg and calf area. Recently had rotator cuff surgery x2 weeks ago. Sent by PCP for possible evaluation of DVT. Pt is conscious alert and oriented x4.

## 2013-12-22 NOTE — Progress Notes (Signed)
Subjective:     Patient ID: [name], female   DOB: 14-May-1961, 53 y.o.   MRN: [id number]  HPI Leg swelling: Patient presented to the clinic with hx of b/l lower limb swelling worse on her left with calf cramping and pain. She has problem with lower limb swelling in the past but this worsened over the last few days after her surgery (Rotator cuff repair) which she had few weeks ago. Since after her surgery she has not been ambulating a lot due to pain all over her body. Most of the time she is resting on her recliner chair at home. Her leg swelling is worse with prolonged sitting. She uses HCTZ at home with no improvement even though she urinates a lot. She denies eating too much salt in her diet. She denies any cardiovascular symptoms. HTN: Patient is currently on HCTZ 25 mg qd. She is compliant with her medication.    Current Outpatient Prescriptions on File Prior to Visit  Medication Sig Dispense Refill  . albuterol (PROVENTIL HFA;VENTOLIN HFA) 108 (90 BASE) MCG/ACT inhaler Inhale into the lungs every 6 (six) hours as needed for wheezing or shortness of breath.      . AMITIZA 24 MCG capsule       . aspirin 81 MG tablet Take 81 mg by mouth as needed for pain.      . calcium-vitamin D (OSCAL WITH D) 500-200 MG-UNIT per tablet Take 1 tablet by mouth daily.      . CELEBREX 200 MG capsule       . cyclobenzaprine (FLEXERIL) 10 MG tablet TAKE 1 TABLET BY MOUTH 3 TIMES A DAY AS NEEDED  60 tablet  0  . diazepam (VALIUM) 10 MG tablet Take 10 mg by mouth every 12 (twelve) hours. Schedule doses to reduce muscle spasms      . diclofenac sodium (VOLTAREN) 1 % GEL Apply 1 application topically 2 (two) times daily as needed. For right shoulder and arm and legs      . DiphenhydrAMINE HCl (BENADRYL PO) Take 2 capsules by mouth 1 day or 1 dose. In case she comes in contact with shellfish or shrimp      . gabapentin (NEURONTIN) 300 MG capsule Take 600 mg by mouth 3 (three) times daily.       .  hydrochlorothiazide (HYDRODIURIL) 25 MG tablet Take 25 mg by mouth daily. One tablet by mouth in the morning.       Marland Kitchen. HYDROcodone-acetaminophen (NORCO) 10-325 MG per tablet       . LORazepam (ATIVAN) 1 MG tablet Take 1 mg by mouth 2 (two) times daily as needed. For anxiety attacks      . omeprazole (PRILOSEC) 20 MG capsule Take 20 mg by mouth 2 (two) times daily before a meal.      . pregabalin (LYRICA) 75 MG capsule Take 75 mg by mouth 2 (two) times daily.      Marland Kitchen. zolpidem (AMBIEN) 10 MG tablet Take 10 mg by mouth at bedtime as needed. For sleep       No current facility-administered medications on file prior to visit.   Past Medical History  Diagnosis Date  . Bronchitis   . Jones fracture     left foot fifth metatarsal  . Fibromyalgia     diagnosed 2001  . Varicose vein     protrudes above skin-per pt;vein popped and bruised;ultrasound done to make sure that there were no clots;noclots were found  . Chronic back pain  2012 tailbone surgery and 3 lower discs.   . Multiple allergies     including latex, pet dander, shellfish, pet dander  . Asthma     but pt states not severe enough to even have an inhaler  . Shortness of breath     with exertion;pt states that its related to her weight  . Hyperthyroidism     but pt doesn't take anything for it;thyroid nodules  . Headache(784.0)     "sinus headaches"  . Dizziness     rarely  . Impaired memory     states from fibromyalgia  . GERD (gastroesophageal reflux disease)     Prilosec occasionally  . IBS (irritable bowel syndrome)   . Arthritis     bilateral knees s/p knee replacement bilaterally  . Bruising     pt states unexplained d/t fibromyalgia  . Anemia   . Anxiety     takes Ativan and Valium, after mother passed  . Depression     from Fibromyalgia diagnosis; not taking medicine. since 2001  . Insomnia     takes Ambien  . Sore gums     this is why pt is on Amoxil-only takes for dental work  . Hypertension     since  2013  . Tachycardia   . Seasonal allergies   . Anemia      Review of Systems  Respiratory: Negative for cough, chest tightness and shortness of breath.   Cardiovascular: Positive for leg swelling.  Gastrointestinal: Negative.   All other systems reviewed and are negative.   Filed Vitals:   12/22/13 1059  BP: 157/94  Pulse: 126  Temp: 97.8 F (36.6 C)  TempSrc: Oral  Height: 5\' 2"  (1.575 m)  Weight: 237 lb 4.8 oz (107.639 kg)       Objective:   Physical Exam  Nursing note and vitals reviewed. Constitutional: She appears well-developed. No distress.  Cardiovascular: Normal rate, regular rhythm, normal heart sounds and intact distal pulses.   No murmur heard. Pulmonary/Chest: Effort normal and breath sounds normal. No respiratory distress. She has no wheezes.  Abdominal: Soft. Bowel sounds are normal. There is no tenderness.  Musculoskeletal:       Right foot: She exhibits swelling.       Left foot: She exhibits swelling.  B/L ankle edema about 2 pluses. Swelling extends up to about 1 inch below the knee B/L. + Calf tenderness and swelling, left bigger than right. Left calf circumference 45cm, right calf circumference 43cm.       Assessment:     Leg swelling HTN     Plan:     Check problem list.  More than 30 min spent on face to face encounter and coordination of care for patient today.

## 2013-12-22 NOTE — Patient Instructions (Addendum)
I am sorry about your leg swelling and your calf pain, as discussed we will give you Lasix IM today to help with the swelling, continue taking you HCTZ as prescribed by your PCP. I will also recommend compression stockings and leg raising. I am concern your left calf is a little larger than the right and you do have more tenderness of your left calf compared to the right hence I will like to check you for clot in the leg even though I feel this is unlikely but because you just had surgery and has been immobile with leg swelling it is reasonable to check. We will start by checking your d-dimer, if positive and I will call you to get U/S done of your left calf. Please follow up with your PCP in 1 wk.  Edema Edema is a buildup of fluids. It is most common in the feet, ankles, and legs. This happens more as a person ages. It may affect one or both legs. HOME CARE   Raise (elevate) the legs or ankles above the level of the heart while lying down.  Avoid sitting or standing still for a long time.  Exercise the legs to help the puffiness (swelling) go down.  A low-salt diet may help lessen the puffiness.  Only take medicine as told by your doctor. GET HELP RIGHT AWAY IF:   You develop shortness of breath or chest pain.  You cannot breathe when you lie down.  You have more puffiness that does not go away with treatment.  You develop pain or redness in the areas that are puffy.  You have a temperature by mouth above 102 F (38.9 C), not controlled by medicine.  You gain 03 lb/1.4 kg or more in 1 day or 05 lb/2.3 kg in a week. MAKE SURE YOU:   Understand these instructions.  Will watch your condition.  Will get help right away if you are not doing well or get worse. Document Released: 03/11/2008 Document Revised: 12/16/2011 Document Reviewed: 03/11/2008 [facility] Patient Information 2014 [place]ExitCare, [place].

## 2013-12-22 NOTE — Telephone Encounter (Signed)
I called and discussed D-dimer result with patient. D-dimer= 3.26 Patient endorsed improvement of her leg swelling s/p IM lasix. I however still recommended Vascular doppler to r/o DVT. Patient instructed to go to the ED to get imaging, if negative she would be d/c home, if positive to be started on anticoagulant. Patient verbalized understanding and agreed to go to the ED to get assessed.

## 2013-12-22 NOTE — Progress Notes (Signed)
*  Preliminary Results* Left lower extremity venous duplex completed. Left lower extremity is negative for deep vein thrombosis. There is no evidence of left Baker's cyst.  12/22/2013 4:50 PM  Gertie FeyMichelle Kewana Sanon, RVT, RDCS, RDMS

## 2013-12-22 NOTE — Discharge Instructions (Signed)
Continue to wear compressive stocking.  Elevate legs when sitting.  Follow up closely with your doctor for further care.  You blood clots in your left leg on today's evaluation.   Edema Edema is an abnormal build-up of fluids in tissues. Because this is partly dependent on gravity (water flows to the lowest place), it is more common in the legs and thighs (lower extremities). It is also common in the looser tissues, like around the eyes. Painless swelling of the feet and ankles is common and increases as a person ages. It may affect both legs and may include the calves or even thighs. When squeezed, the fluid may move out of the affected area and may leave a dent for a few moments. CAUSES   Prolonged standing or sitting in one place for extended periods of time. Movement helps pump tissue fluid into the veins, and absence of movement prevents this, resulting in edema.  Varicose veins. The valves in the veins do not work as well as they should. This causes fluid to leak into the tissues.  Fluid and salt overload.  Injury, burn, or surgery to the leg, ankle, or foot, may damage veins and allow fluid to leak out.  Sunburn damages vessels. Leaky vessels allow fluid to go out into the sunburned tissues.  Allergies (from insect bites or stings, medications or chemicals) cause swelling by allowing vessels to become leaky.  Protein in the blood helps keep fluid in your vessels. Low protein, as in malnutrition, allows fluid to leak out.  Hormonal changes, including pregnancy and menstruation, cause fluid retention. This fluid may leak out of vessels and cause edema.  Medications that cause fluid retention. Examples are sex hormones, blood pressure medications, steroid treatment, or anti-depressants.  Some illnesses cause edema, especially heart failure, kidney disease, or liver disease.  Surgery that cuts veins or lymph nodes, such as surgery done for the heart or for breast cancer, may result in  edema. DIAGNOSIS  Your caregiver is usually easily able to determine what is causing your swelling (edema) by simply asking what is wrong (getting a history) and examining you (doing a physical). Sometimes x-rays, EKG (electrocardiogram or heart tracing), and blood work may be done to evaluate for underlying medical illness. TREATMENT  General treatment includes:  Leg elevation (or elevation of the affected body part).  Restriction of fluid intake.  Prevention of fluid overload.  Compression of the affected body part. Compression with elastic bandages or support stockings squeezes the tissues, preventing fluid from entering and forcing it back into the blood vessels.  Diuretics (also called water pills or fluid pills) pull fluid out of your body in the form of increased urination. These are effective in reducing the swelling, but can have side effects and must be used only under your caregiver's supervision. Diuretics are appropriate only for some types of edema. The specific treatment can be directed at any underlying causes discovered. Heart, liver, or kidney disease should be treated appropriately. HOME CARE INSTRUCTIONS   Elevate the legs (or affected body part) above the level of the heart, while lying down.  Avoid sitting or standing still for prolonged periods of time.  Avoid putting anything directly under the knees when lying down, and do not wear constricting clothing or garters on the upper legs.  Exercising the legs causes the fluid to work back into the veins and lymphatic channels. This may help the swelling go down.  The pressure applied by elastic bandages or support stockings can  help reduce ankle swelling.  A low-salt diet may help reduce fluid retention and decrease the ankle swelling.  Take any medications exactly as prescribed. SEEK MEDICAL CARE IF:  Your edema is not responding to recommended treatments. SEEK IMMEDIATE MEDICAL CARE IF:   You develop shortness  of breath or chest pain.  You cannot breathe when you lay down; or if, while lying down, you have to get up and go to the window to get your breath.  You are having increasing swelling without relief from treatment.  You develop a fever over 102 F (38.9 C).  You develop pain or redness in the areas that are swollen.  Tell your caregiver right away if you have gained 03 lb/1.4 kg in 1 day or 05 lb/2.3 kg in a week. MAKE SURE YOU:   Understand these instructions.  Will watch your condition.  Will get help right away if you are not doing well or get worse. Document Released: 09/23/2005 Document Revised: 03/24/2012 Document Reviewed: 05/11/2008 [facility]mondExitCare Patient Information 2014 [place]ExitCare, [place]LLC.

## 2013-12-22 NOTE — Assessment & Plan Note (Signed)
BP slightly elevated today. Might be due to her pain. Plan to continue current BP regimen. F/U with PCP tomorrow for BP reassessment.

## 2013-12-23 ENCOUNTER — Encounter: Payer: Commercial Managed Care - HMO | Admitting: Family Medicine

## 2013-12-23 NOTE — ED Provider Notes (Signed)
Medical screening examination/treatment/procedure(s) were performed by non-physician practitioner and as supervising physician I was immediately available for consultation/collaboration.   EKG Interpretation None        Enid SkeensJoshua M Martina Brodbeck, MD 12/23/13 [phone]

## 2013-12-30 ENCOUNTER — Encounter: Payer: Self-pay | Admitting: Family Medicine

## 2013-12-30 ENCOUNTER — Other Ambulatory Visit (HOSPITAL_COMMUNITY)
Admission: RE | Admit: 2013-12-30 | Discharge: 2013-12-30 | Disposition: A | Discharge status: Home / Self Care | Payer: Medicare HMO | Source: Ambulatory Visit | Attending: Family Medicine | Admitting: Family Medicine

## 2013-12-30 ENCOUNTER — Other Ambulatory Visit: Payer: Self-pay

## 2013-12-30 ENCOUNTER — Ambulatory Visit (INDEPENDENT_AMBULATORY_CARE_PROVIDER_SITE_OTHER): Payer: Commercial Managed Care - HMO | Admitting: Family Medicine

## 2013-12-30 VITALS — BP 141/87 | HR 97 | Temp 98.4°F | Wt 239.6 lb

## 2013-12-30 DIAGNOSIS — I10 Essential (primary) hypertension: Secondary | ICD-10-CM

## 2013-12-30 DIAGNOSIS — Z113 Encounter for screening for infections with a predominantly sexual mode of transmission: Secondary | ICD-10-CM | POA: Insufficient documentation

## 2013-12-30 DIAGNOSIS — N898 Other specified noninflammatory disorders of vagina: Secondary | ICD-10-CM

## 2013-12-30 DIAGNOSIS — Z124 Encounter for screening for malignant neoplasm of cervix: Secondary | ICD-10-CM

## 2013-12-30 DIAGNOSIS — R3589 Other polyuria: Secondary | ICD-10-CM

## 2013-12-30 DIAGNOSIS — Z01419 Encounter for gynecological examination (general) (routine) without abnormal findings: Secondary | ICD-10-CM | POA: Insufficient documentation

## 2013-12-30 DIAGNOSIS — R358 Other polyuria: Secondary | ICD-10-CM

## 2013-12-30 DIAGNOSIS — Z20828 Contact with and (suspected) exposure to other viral communicable diseases: Secondary | ICD-10-CM

## 2013-12-30 DIAGNOSIS — Z1211 Encounter for screening for malignant neoplasm of colon: Secondary | ICD-10-CM

## 2013-12-30 DIAGNOSIS — N760 Acute vaginitis: Secondary | ICD-10-CM | POA: Insufficient documentation

## 2013-12-30 DIAGNOSIS — Z202 Contact with and (suspected) exposure to infections with a predominantly sexual mode of transmission: Secondary | ICD-10-CM

## 2013-12-30 DIAGNOSIS — Z1231 Encounter for screening mammogram for malignant neoplasm of breast: Secondary | ICD-10-CM

## 2013-12-30 DIAGNOSIS — Z1151 Encounter for screening for human papillomavirus (HPV): Secondary | ICD-10-CM | POA: Insufficient documentation

## 2013-12-30 LAB — POCT GLYCOSYLATED HEMOGLOBIN (HGB A1C): Hemoglobin A1C: 6.3

## 2013-12-30 LAB — POCT UA - MICROSCOPIC ONLY

## 2013-12-30 LAB — COMPREHENSIVE METABOLIC PANEL
ALT: 15 U/L (ref 0–35)
AST: 16 U/L (ref 0–37)
Albumin: 4.1 g/dL (ref 3.5–5.2)
Alkaline Phosphatase: 121 U/L — ABNORMAL HIGH (ref 39–117)
BUN: 10 mg/dL (ref 6–23)
CO2: 27 mEq/L (ref 19–32)
Calcium: 9.4 mg/dL (ref 8.4–10.5)
Chloride: 102 mEq/L (ref 96–112)
Creat: 0.63 mg/dL (ref 0.50–1.10)
Glucose, Bld: 78 mg/dL (ref 70–99)
Potassium: 3.5 mEq/L (ref 3.5–5.3)
Sodium: 140 mEq/L (ref 135–145)
Total Bilirubin: 0.2 mg/dL (ref 0.2–1.2)
Total Protein: 6.3 g/dL (ref 6.0–8.3)

## 2013-12-30 LAB — RPR

## 2013-12-30 LAB — POCT URINALYSIS DIPSTICK
Bilirubin, UA: NEGATIVE
Glucose, UA: NEGATIVE
Ketones, UA: NEGATIVE
Nitrite, UA: NEGATIVE
Protein, UA: NEGATIVE
Spec Grav, UA: 1.015
Urobilinogen, UA: 0.2
pH, UA: 7

## 2013-12-30 LAB — POCT WET PREP (WET MOUNT)
Clue Cells Wet Prep Whiff POC: NEGATIVE
WBC, Wet Prep HPF POC: >20

## 2013-12-30 LAB — LIPID PANEL
Cholesterol: 196 mg/dL (ref 0–200)
HDL: 70 mg/dL (ref >39)
LDL Cholesterol: 106 mg/dL — ABNORMAL HIGH (ref 0–99)
Total CHOL/HDL Ratio: 2.8 Ratio
Triglycerides: 102 mg/dL (ref <150)
VLDL: 20 mg/dL (ref 0–40)

## 2013-12-30 LAB — CBC
HCT: 29.2 % — ABNORMAL LOW (ref 36.0–46.0)
Hemoglobin: 8.9 g/dL — ABNORMAL LOW (ref 12.0–15.0)
MCH: 18.7 pg — ABNORMAL LOW (ref 26.0–34.0)
MCHC: 30.5 g/dL (ref 30.0–36.0)
MCV: 61.3 fL — ABNORMAL LOW (ref 78.0–100.0)
Platelets: 681 10*3/uL — ABNORMAL HIGH (ref 150–400)
RBC: 4.76 MIL/uL (ref 3.87–5.11)
RDW: 17 % — ABNORMAL HIGH (ref 11.5–15.5)
WBC: 11.8 10*3/uL — ABNORMAL HIGH (ref 4.0–10.5)

## 2013-12-30 NOTE — Patient Instructions (Addendum)
For your labs, I will send you a letter if there are no medication changes needed. I will call you if we need to discuss your lab results.  Orders Placed This Encounter  Procedures  . HIV antibody  . RPR -syphilis  . Ambulatory referral to Gastroenterology - you will be called with thsi referral  . POCT urinalysis dipstick -check for urine infection  . POCT Wet Prep ([place])-yeast, BV, trichomonas  . POCT glycosylated hemoglobin (Hb A1C) -check for diabetes since you are peeing frequently  Added CBC and CMET due to high blood pressure  Your blood pressure was up slightly today but I understand you are in pain. We will keep an eye on it for now. Continue current medications.   Health Maintenance Due  Topic Date Due  . Pap Smear -today 08/02/1980  . Mammogram -call for appointment 08/02/2012  . Colonoscopy -see above 08/02/2012   See me as we previously discussed for pain and depression,  Dr. Durene CalHunter

## 2013-12-30 NOTE — Progress Notes (Signed)
Jill Conch, MD Phone: [phone]  Subjective:  Chief complaint-noted  [name] is a 53 y.o. year old very pleasant female patient who presents with the following:  Hypertension BP Readings from Last 3 Encounters:  12/30/13 141/87  12/22/13 151/95  12/22/13 157/94  Home BP monitoring-no Compliant with medications-yes without side effects, HCTZ 25mg  ROS-Denies any chest pain or shortness of breath. Is having some left shoulder pain after surgery.    Vaginal discharge Intermittent green vaginal discharge for 3 months. Describes foul odor. Occasional itching ROS- no fever/chills/nausea/vomiting/no abdominal pain  Polyuria Occuring for last year. Thinks it may be due to her drinking a lot of fluids. No dysuira ROS- no hypoglycemia symptoms. No fever/chills. No CVA tenderness Family history-grandmother with diabetes  Annual Gynecological Exam  Last period: 12/13/2013 Regular periods: yes Heavy bleeding: no  Sexually active: yes, last time in November, unprotected Birth control or hormonal therapy:BTL Hx of STD: Patient desires STD screening Dyspareunia: No Hot flashes: No Vaginal discharge: yes see above Dysuria:No but has polyuria as above  Last mammogram: 8 years ago, handout given Breast mass or concerns: No Last Pap: not sure  History of abnormal pap: No   FH of breast, uterine, ovarian, colon cancer: aunt with breast cancer at age 35  Past Medical History- Patient Active Problem List   Diagnosis Date Noted  . Chronic back pain due to DJD with history surgery     Priority: Medium  . Hyperthyroidism     Priority: Medium  . Fibromyalgia     Priority: Medium  . Anxiety     Priority: Medium  . Depression     Priority: Medium  . Hypertension     Priority: Medium  . Seasonal allergies     Priority: Low  . Anemia     Priority: Low  . 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 Status post left total  prosthetic replacement of knee joint using cement 03/24/2012    Priority: Low  . GERD (gastroesophageal reflux disease)     Priority: Low  . Lumbar degenerative disc disease 09/25/2011    Priority: Low  . Leg swelling 12/22/2013   Medications- reviewed and updated Current Outpatient Prescriptions  Medication Sig Dispense Refill  . albuterol (PROVENTIL HFA;VENTOLIN HFA) 108 (90 BASE) MCG/ACT inhaler Inhale into the lungs every 6 (six) hours as needed for wheezing or shortness of breath.      . AMITIZA 24 MCG capsule Take 24 mcg by mouth 2 (two) times daily with a meal.       . aspirin 81 MG tablet Take 81 mg by mouth as needed for pain.      . calcium-vitamin D (OSCAL WITH D) 500-200 MG-UNIT per tablet Take 1 tablet by mouth daily.      . diazepam (VALIUM) 10 MG tablet Take 10 mg by mouth every 12 (twelve) hours. Schedule doses to reduce muscle spasms      . diclofenac sodium (VOLTAREN) 1 % GEL Apply 1 application topically 2 (two) times daily as needed (shoulder, arm and leg pain). For right shoulder and arm and legs      . DiphenhydrAMINE HCl (BENADRYL PO) Take 2 capsules by mouth 1 day or 1 dose. In case she comes in contact with shellfish or shrimp      . gabapentin (NEURONTIN) 300 MG capsule Take 600 mg by mouth 3 (three) times daily.       . hydrochlorothiazide (  HYDRODIURIL) 25 MG tablet Take 25 mg by mouth daily. One tablet by mouth in the morning.       Marland Kitchen. HYDROcodone-acetaminophen (NORCO) 10-325 MG per tablet Take 1 tablet by mouth every 4 (four) hours as needed for moderate pain.       Marland Kitchen. LORazepam (ATIVAN) 1 MG tablet Take 1 mg by mouth 2 (two) times daily as needed. For anxiety attacks      . omeprazole (PRILOSEC) 20 MG capsule Take 20 mg by mouth 2 (two) times daily before a meal.      . pregabalin (LYRICA) 75 MG capsule Take 75 mg by mouth 2 (two) times daily.      Marland Kitchen. zolpidem (AMBIEN) 10 MG tablet Take 10 mg by mouth at bedtime as needed. For sleep       No current  facility-administered medications for this visit.    Objective: BP 141/87  Pulse 97  Temp(Src) 98.4 F (36.9 C) (Oral)  Wt 239 lb 9.6 oz (108.682 kg)  LMP 02/10/2012 Gen: NAD, resting comfortably CV: RRR no murmurs rubs or gallops Lungs: CTAB no crackles, wheeze, rhonchi Abdomen: soft/nontender/nondistended/normal bowel sounds. No rebound or guarding.  Pelvic: cervix normal in appearance, external genitalia normal, no adnexal masses or tenderness, no cervical motion tenderness, uterus normal size, shape, and consistency and small amount of clear discharge in posterior vaginal vault  Assessment/Plan:  Hypertension Mildly elevated. Recent surgery and in pain. COntinue HCTZ and continue to follow for now. Check cbc, cmet, lipids.    Vaginal discharge/concern for STD Check wet prep, gc/chlamydia. Also screen for STDs with HIV/RPR. On exam, appears physiologic.   Polyuria Check urinalysis as well as a1c given obesity, hypertension. Doubt UTI given 1 year.   Annual Gynecological Exam Pap smear completed today. Vaginal discharge/concern STI as above.  Also gave handout for patient to call for mammogram.  Referral to GI for first screening colonoscopy.  See AVS.    Orders Placed This Encounter  Procedures  . HIV antibody  . RPR  . CBC  . Comprehensive metabolic panel  . Lipid panel    Order Specific Question:  Has the patient fasted?    Answer:  No  . Ambulatory referral to Gastroenterology    Referral Priority:  Routine    Referral Type:  Consultation    Referral Reason:  Specialty Services Required    Requested Specialty:  Gastroenterology    Number of Visits Requested:  1  . POCT urinalysis dipstick  . POCT Wet Prep [employer]([place]et [place])  . POCT glycosylated hemoglobin (Hb A1C)    No orders of the defined types were placed in this encounter.

## 2013-12-31 LAB — CERVICOVAGINAL ANCILLARY ONLY
Chlamydia: NEGATIVE
Neisseria Gonorrhea: NEGATIVE
Trichomonas: NEGATIVE

## 2013-12-31 LAB — HIV ANTIBODY (ROUTINE TESTING W REFLEX): HIV: NONREACTIVE

## 2013-12-31 NOTE — Assessment & Plan Note (Addendum)
Mildly elevated. Recent surgery and in pain. COntinue HCTZ and continue to follow for now. Check cbc, cmet, lipids.

## 2014-01-03 ENCOUNTER — Other Ambulatory Visit: Payer: Self-pay | Admitting: *Deleted

## 2014-01-03 MED ORDER — HYDROCHLOROTHIAZIDE 25 MG PO TABS: 25.0000 mg | ORAL_TABLET | Freq: Every day | ORAL | Status: DC

## 2014-01-04 ENCOUNTER — Telehealth: Payer: Self-pay | Admitting: Family Medicine

## 2014-01-04 MED ORDER — HYDROCHLOROTHIAZIDE 25 MG PO TABS: 25.0000 mg | ORAL_TABLET | Freq: Every day | ORAL | Status: DC

## 2014-01-04 NOTE — Telephone Encounter (Signed)
Pt called and needs her hydrochlorothiazide sent to the [facility] on [address]. jw

## 2014-01-04 NOTE — Telephone Encounter (Signed)
Refilled

## 2014-01-06 ENCOUNTER — Telehealth: Payer: Self-pay | Admitting: Family Medicine

## 2014-01-06 DIAGNOSIS — D649 Anemia, unspecified: Secondary | ICD-10-CM

## 2014-01-06 DIAGNOSIS — IMO0002 Reserved for concepts with insufficient information to code with codable children: Secondary | ICD-10-CM | POA: Insufficient documentation

## 2014-01-06 MED ORDER — CEPHALEXIN 500 MG PO CAPS: 500.0000 mg | ORAL_CAPSULE | Freq: Four times a day (QID) | ORAL | Status: DC

## 2014-01-06 NOTE — Telephone Encounter (Signed)
Discussed lab results with patient by phone.  1. At risk for diabetes 2. WBC elevated but post surgery. Anemic-will need iron studies as microcytic 3. Lipids <5% risk CV event so no statin at this time but does have hyperlipidemia given LDL 4. Still with UTI symptoms and UA suggestive of UTI. Will send in keflex.  5. STD screen and wet prep negative.  6. Pap smear with ASCUS but HPV negative so needs repeat test in 3 years with cotesting.   Patient requests the following: 1. Ambien and valium refill. Advised to get appointment next Wednesday with me to discuss psych meds 2. Add celebrex to med list.

## 2014-01-06 NOTE — Assessment & Plan Note (Signed)
HPV negative 12/2013. Needs cotesting 12/2016.

## 2014-01-06 NOTE — Assessment & Plan Note (Signed)
Will check iron, ferritin, TIBC, reticulocytes at next visit.

## 2014-01-10 ENCOUNTER — Ambulatory Visit: Payer: Medicare HMO

## 2014-01-12 ENCOUNTER — Encounter: Payer: Self-pay | Admitting: Family Medicine

## 2014-01-12 ENCOUNTER — Ambulatory Visit (HOSPITAL_COMMUNITY)
Admission: RE | Admit: 2014-01-12 | Discharge: 2014-01-12 | Disposition: A | Discharge status: Home / Self Care | Payer: Medicare HMO | Source: Ambulatory Visit | Attending: Family Medicine | Admitting: Family Medicine

## 2014-01-12 ENCOUNTER — Ambulatory Visit (INDEPENDENT_AMBULATORY_CARE_PROVIDER_SITE_OTHER): Payer: Commercial Managed Care - HMO | Admitting: Family Medicine

## 2014-01-12 VITALS — BP 134/88 | HR 89 | Temp 98.7°F | Ht 62.0 in | Wt 237.0 lb

## 2014-01-12 DIAGNOSIS — IMO0001 Reserved for inherently not codable concepts without codable children: Secondary | ICD-10-CM | POA: Diagnosis not present

## 2014-01-12 DIAGNOSIS — E0590 Thyrotoxicosis, unspecified without thyrotoxic crisis or storm: Secondary | ICD-10-CM

## 2014-01-12 DIAGNOSIS — F3289 Other specified depressive episodes: Secondary | ICD-10-CM | POA: Insufficient documentation

## 2014-01-12 DIAGNOSIS — R300 Dysuria: Secondary | ICD-10-CM | POA: Insufficient documentation

## 2014-01-12 DIAGNOSIS — T887XXA Unspecified adverse effect of drug or medicament, initial encounter: Secondary | ICD-10-CM

## 2014-01-12 DIAGNOSIS — F329 Major depressive disorder, single episode, unspecified: Secondary | ICD-10-CM

## 2014-01-12 DIAGNOSIS — M797 Fibromyalgia: Secondary | ICD-10-CM

## 2014-01-12 DIAGNOSIS — F32A Depression, unspecified: Secondary | ICD-10-CM

## 2014-01-12 MED ORDER — PREGABALIN 75 MG PO CAPS: 75.0000 mg | ORAL_CAPSULE | Freq: Two times a day (BID) | ORAL | Status: DC

## 2014-01-12 MED ORDER — DULOXETINE HCL 30 MG PO CPEP: 60.0000 mg | ORAL_CAPSULE | Freq: Every day | ORAL | Status: DC

## 2014-01-12 MED ORDER — CYCLOBENZAPRINE HCL 10 MG PO TABS: 10.0000 mg | ORAL_TABLET | Freq: Three times a day (TID) | ORAL | Status: DC | PRN

## 2014-01-12 MED ORDER — CELECOXIB 200 MG PO CAPS: 200.0000 mg | ORAL_CAPSULE | Freq: Two times a day (BID) | ORAL | Status: DC

## 2014-01-12 MED ORDER — DICLOFENAC SODIUM 1 % TD GEL: 1.0000 "application " | Freq: Two times a day (BID) | TRANSDERMAL | Status: DC | PRN

## 2014-01-12 MED ORDER — ALBUTEROL SULFATE HFA 108 (90 BASE) MCG/ACT IN AERS
2.0000 | INHALATION_SPRAY | Freq: Four times a day (QID) | RESPIRATORY_TRACT | Status: DC | PRN
Start: 2014-01-12 — End: 2020-03-24

## 2014-01-12 NOTE — Assessment & Plan Note (Signed)
Ordered TSH. Patient to return for this. May need repeat endocrine referral or to obtain records.

## 2014-01-12 NOTE — Assessment & Plan Note (Addendum)
Concern for Depression/ ultimately with moderate depression PHQ9 10-14: Moderate Depression-discussed options of medication or counseling/CBT and patient opted for medication. Chose cymbalta due to potential fibromyalgia benefits. Celexa was patients initial choice but concern on omeprazole with qt corrected at 490.  Will follow up q 10 days until improving then on monthly basis using PHQ9.  Regarding medication, will assess safety (SI), tolerability (SE), and efficacy (repeat PHQ9) at next visit and titrate up as needed.  Regarding SI, patient denies.  History of depression but decided to explore history today before initiating therapy.  -MDQ not indicated (considered given insomnia and anxiety-insomnia without goal directed activity and very fatigued in daytime though) -Will obtain baseline labs (TSH) not listed above within last year.  -Baseline EKG if over 40-QT prolongation shows Qt corrected of 490, decision made to avoid celexa as a result -Discussed remission may take several months and will reassesses medication/therapy needs after 6 months of remission.

## 2014-01-12 NOTE — Patient Instructions (Addendum)
Taking the medicine as directed and not missing any doses is one of the best things you can do to treat your depression. Here are some things to keep in mind:  1. Side effects (stomach upset, some increased anxiety) may happen before you notice a benefit. These side effects typically go away over time. 2. Changes to your dose of medicine or a change in medication all together is sometimes necessary 3. Most people need to be on medication at least 6-12 months 4. Many people will notice an improvement within two weeks but the full effect of the medication can take up to 4-6 weeks 5. Stopping the medication when you start feeling better often results in a return of symptoms 6. If you start having thoughts of hurting yourself or others after starting this medicine, please call me at [phone] immediately.   PHQ9 10-14: Moderate Depression-discussed options of medication or counseling/CBT and patient opted for medication. Will follow up in 10 days with another PHQ9.  Regarding medication, will assess safety (SI), tolerability (SE), and efficacy. If you have any thoughts of hurting yourself or others, call us immediately or call 911.   Meds ordered this encounter  Medications  . albuterol (PROVENTIL HFA;VENTOLIN HFA) 108 (90 BASE) MCG/ACT inhaler    Sig: Inhale 2 puffs into the lungs every 6 (six) hours as needed for wheezing or shortness of breath.    Dispense:  1 Inhaler    Refill:  3  . celecoxib (CELEBREX) 200 MG capsule    Sig: Take 1 capsule (200 mg total) by mouth 2 (two) times daily.    Dispense:  60 capsule    Refill:  3  . diclofenac sodium (VOLTAREN) 1 % GEL    Sig: Apply 1 application topically 2 (two) times daily as needed (shoulder, arm and leg pain). For right shoulder and arm and legs    Dispense:  100 g    Refill:  3  . pregabalin (LYRICA) 75 MG capsule    Sig: Take 1 capsule (75 mg total) by mouth 2 (two) times daily.    Dispense:  60 capsule    Refill:  3  . cyclobenzaprine  (FLEXERIL) 10 MG tablet    Sig: Take 1 tablet (10 mg total) by mouth 3 (three) times daily as needed for muscle spasms.    Dispense:  60 tablet    Refill:  2  . DULoxetine (CYMBALTA) 30 MG capsule    Sig: Take 2 capsules (60 mg total) by mouth daily. Take 1 daily until you see me in 10 days, likely will increase to 2 daily    Dispense:  60 capsule    Refill:  1

## 2014-01-12 NOTE — Progress Notes (Signed)
Jill ConchStephen Glendale Youngblood, MD Phone: [phone]  Subjective:  Chief complaint-noted  Fibromyalgia Patient states she takes valium and flexeril for spasms associated with fibromyalgia. She takes narcotics for pain (norco 10-325). Used to take Lyrica regularly but now does not as doesn't help as much for pain. Also on high dose gabapentin. Complains of diffuse pain, fatigue, spasms. Unable to exercise due to having bilateral knee replacement in last few years and still with discomfort.  ROS- no fever/chills/nausea/vomiting/unintentional weight loss.   Hyperthyroidism Given history of depression, no recent TSH on books. Patient states is hyperthyroid but somehow this causes her to gain weight. States medications to treat it would kill her. Previously seen by Dr. Ricky StabsJerkins and Dr. Ladoris GeneBallas. No records currently available.  ROS-no recent weight changes or hot/cold intolerance.   Concern for Depression/anxiety  HPI: patient describes difficulties due to pain from fibromyalgia. Inability to do things she would normally do due to pain so this gets her down. Unable to work. History of panic attacks starting in 2011 (wakes up and cant catch breath, always wakes up out of sleep). Difficulty sleeping or cant sleep, sometimes sleeps too much. Formerly on [place] and ambien combined to help her sleep.  Onset/Duration (>2 weeks required):  2009 (diagnosed fibromyalgia 2001)  Symptoms (SIGECAPS):   1. Depressed Mood: Yes   2. Decreased Interest: Yes   3. SI/HI: no  4. PHQ9 14  5. MDQ -denies treatment failure, substance abuse, history  Suicide attempt, early age, family or personal history, aggression/irritability/law Issues  6. Level of Impairment (What do these symptoms get in the way of you doing). Disabled from fibromyalgia and does not enjoy typical activies  Medical History including Psychiatric   1. Ever on psych meds: yes, ativan for anxiety. Also on valium for spasms of fibromyalgia and flexeril as muscle  relaxant. Also on ambien for sleep.    Name of meds and # failures: denies  2. Psych history/ever hospitalized: depression history but not treated.   3. Alcohol drinks per week: denies; smoking denies; other drugs: and denies history  4. History of thyroid disease or anemia: history of hyperthyroidism but states nothing was able to be done.   5. History of traumatic event (PTSD?): no. Mom's death still bothers her from 2011.    Specifically abuse: no  Patient Active Problem List   Diagnosis Date Noted  . Chronic back pain due to DJD with history surgery     Priority: Medium  . Hyperthyroidism     Priority: Medium  . Fibromyalgia     Priority: Medium  . Anxiety     Priority: Medium  . Depression     Priority: Medium  . Hypertension     Priority: Medium  . Seasonal allergies     Priority: Low  . Anemia     Priority: Low  . 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 Status post left total prosthetic replacement of knee joint using cement 03/24/2012    Priority: Low  . GERD (gastroesophageal reflux disease)     Priority: Low  . Lumbar degenerative disc disease 09/25/2011    Priority: Low  . ASCUS favor benign 01/06/2014  . Leg swelling 12/22/2013    Family history:  1. Any history of psychiatric issues: denies, I personally know sister who has mood disorder listed-listed as severe depression.   2. Specifically bipolar: denies  Medications- reviewed and updated Current Outpatient Prescriptions  Medication Sig Dispense Refill  .  albuterol (PROVENTIL HFA;VENTOLIN HFA) 108 (90 BASE) MCG/ACT inhaler Inhale 2 puffs into the lungs every 6 (six) hours as needed for wheezing or shortness of breath.  1 Inhaler  3  . AMITIZA 24 MCG capsule Take 24 mcg by mouth 2 (two) times daily with a meal.       . aspirin 81 MG tablet Take 81 mg by mouth as needed for pain.      . calcium-vitamin D (OSCAL WITH D) 500-200 MG-UNIT per tablet Take 1 tablet  by mouth daily.      . celecoxib (CELEBREX) 200 MG capsule Take 1 capsule (200 mg total) by mouth 2 (two) times daily.  60 capsule  3  . cephALEXin (KEFLEX) 500 MG capsule Take 1 capsule (500 mg total) by mouth 4 (four) times daily.  28 capsule  0  . cyclobenzaprine (FLEXERIL) 10 MG tablet Take 1 tablet (10 mg total) by mouth 3 (three) times daily as needed for muscle spasms.  60 tablet  2  . diclofenac sodium (VOLTAREN) 1 % GEL Apply 1 application topically 2 (two) times daily as needed (shoulder, arm and leg pain). For right shoulder and arm and legs  100 g  3  . DiphenhydrAMINE HCl (BENADRYL PO) Take 2 capsules by mouth 1 day or 1 dose. In case she comes in contact with shellfish or shrimp      . DULoxetine (CYMBALTA) 30 MG capsule Take 2 capsules (60 mg total) by mouth daily. Take 1 daily until you see me in 10 days, likely will increase to 2 daily  60 capsule  1  . gabapentin (NEURONTIN) 300 MG capsule Take 600 mg by mouth 3 (three) times daily.       . hydrochlorothiazide (HYDRODIURIL) 25 MG tablet Take 1 tablet (25 mg total) by mouth daily. One tablet by mouth in the morning.  30 tablet  11  . omeprazole (PRILOSEC) 20 MG capsule Take 20 mg by mouth 2 (two) times daily before a meal.      . pregabalin (LYRICA) 75 MG capsule Take 1 capsule (75 mg total) by mouth 2 (two) times daily.  60 capsule  3   No current facility-administered medications for this visit.    Objective: BP 134/88  Pulse 89  Temp(Src) 98.7 F (37.1 C) (Oral)  Ht 5\' 2"  (1.575 m)  Wt 237 lb (107.502 kg)  BMI 43.34 kg/m2  LMP 12/14/2013 Gen: NAD, resting comfortably on table CV: RRR no murmurs rubs or gallops Lungs: CTAB no crackles, wheeze, rhonchi Ext: no edema, right arm in sling  Psych: non pressured speech, talkative but not obviously tangential  Recent TSH:  No results found for this basename: TSH   Recent CBC (Hgb):  Lab Results  Component Value Date   HGB 8.9* 12/30/2013   Recent CMET (baseline):    Chemistry      Component Value Date/Time   NA 140 12/30/2013 1614   K 3.5 12/30/2013 1614   CL 102 12/30/2013 1614   CO2 27 12/30/2013 1614   BUN 10 12/30/2013 1614   CREATININE 0.63 12/30/2013 1614   CREATININE 0.63 04/10/2012 0655      Component Value Date/Time   CALCIUM 9.4 12/30/2013 1614   ALKPHOS 121* 12/30/2013 1614   AST 16 12/30/2013 1614   ALT 15 12/30/2013 1614   BILITOT 0.2 12/30/2013 1614      Baseline EKG if over 40: NSR with occasional PVC.  QT interval corrected 490 Intervals otherwise normal, no st-t  wave changes   Assessment/Plan:   Depression Concern for Depression/ ultimately with moderate depression PHQ9 10-14: Moderate Depression-discussed options of medication or counseling/CBT and patient opted for medication. Chose cymbalta due to potential fibromyalgia benefits. Celexa was patients initial choice but concern on omeprazole with qt corrected at 490.  Will follow up q 10 days until improving then on monthly basis using PHQ9.  Regarding medication, will assess safety (SI), tolerability (SE), and efficacy (repeat PHQ9) at next visit and titrate up as needed.  Regarding SI, patient denies.  History of depression but decided to explore history today before initiating therapy.  -MDQ not indicated (considered given insomnia and anxiety-insomnia without goal directed activity and very fatigued in daytime though) -Will obtain baseline labs (TSH) not listed above within last year.  -Baseline EKG if over 40-QT prolongation shows Qt corrected of 490, decision made to avoid celexa as a result -Discussed remission may take several months and will reassesses medication/therapy needs after 6 months of remission.   Fibromyalgia Extensive conversation with patient over treatment for depression/history of panic attacks/fibromyalgia. She asked me for refills on ambien, ativan, valium, flexeril, norco, and lyrica. Ultimately, we agreed to try cymbalta for treatment of both depression  and fibromyalgia. Continue Lyrica. Stopped ambien and ativan to see if cymbalta will help with sleep. Discontinued valium for spams but continued flexeril.   Hyperthyroidism Ordered TSH. Patient to return for this. May need repeat endocrine referral or to obtain records.    >50% of 65 minute office visit (patient did not leave office until after 6:15 PM) was spent on counseling (narcotics not treatment of choice for fibromyalgia, need to treat anxiety with agent other than benzodiazepines, need for treatment for depression, side effects of medications) and coordination of care   Patient also mentions continued polyuria, so will check urine culture Orders Placed This Encounter  Procedures  . Urine culture    Standing Status: Future     Number of Occurrences:      Standing Expiration Date: 01/13/2015  . TSH    Standing Status: Future     Number of Occurrences:      Standing Expiration Date: 01/13/2015  . EKG 12-Lead    Meds ordered this encounter  Medications  . albuterol (PROVENTIL HFA;VENTOLIN HFA) 108 (90 BASE) MCG/ACT inhaler    Sig: Inhale 2 puffs into the lungs every 6 (six) hours as needed for wheezing or shortness of breath.    Dispense:  1 Inhaler    Refill:  3  . celecoxib (CELEBREX) 200 MG capsule    Sig: Take 1 capsule (200 mg total) by mouth 2 (two) times daily.    Dispense:  60 capsule    Refill:  3  . diclofenac sodium (VOLTAREN) 1 % GEL    Sig: Apply 1 application topically 2 (two) times daily as needed (shoulder, arm and leg pain). For right shoulder and arm and legs    Dispense:  100 g    Refill:  3  . pregabalin (LYRICA) 75 MG capsule    Sig: Take 1 capsule (75 mg total) by mouth 2 (two) times daily.    Dispense:  60 capsule    Refill:  3  . cyclobenzaprine (FLEXERIL) 10 MG tablet    Sig: Take 1 tablet (10 mg total) by mouth 3 (three) times daily as needed for muscle spasms.    Dispense:  60 tablet    Refill:  2  . DULoxetine (CYMBALTA) 30 MG capsule  Sig:  Take 2 capsules (60 mg total) by mouth daily. Take 1 daily until you see me in 10 days, likely will increase to 2 daily    Dispense:  60 capsule    Refill:  1

## 2014-01-12 NOTE — Assessment & Plan Note (Signed)
Extensive conversation with patient over treatment for depression/history of panic attacks/fibromyalgia. She asked me for refills on ambien, ativan, valium, flexeril, norco, and lyrica. Ultimately, we agreed to try cymbalta for treatment of both depression and fibromyalgia. Continue Lyrica. Stopped ambien and ativan to see if cymbalta will help with sleep. Discontinued valium for spams but continued flexeril.

## 2014-01-14 ENCOUNTER — Ambulatory Visit
Admission: RE | Admit: 2014-01-14 | Discharge: 2014-01-14 | Disposition: A | Discharge status: Home / Self Care | Payer: Commercial Managed Care - HMO | Source: Ambulatory Visit

## 2014-01-14 ENCOUNTER — Telehealth: Payer: Self-pay | Admitting: Family Medicine

## 2014-01-14 DIAGNOSIS — Z1231 Encounter for screening mammogram for malignant neoplasm of breast: Secondary | ICD-10-CM

## 2014-01-14 NOTE — Telephone Encounter (Signed)
Patient informed. 

## 2014-01-14 NOTE — Telephone Encounter (Signed)
Will forward to MD to change this. Jazmin Hartsell,CMA

## 2014-01-14 NOTE — Telephone Encounter (Signed)
Patient seen by Durene CalHunter earlier this week. She stated that she reiterated numerous times that her Flexeril [facility] [address] [facility].  [facility]. Please resend RX to the appropriate pharmacy.

## 2014-01-14 NOTE — Telephone Encounter (Signed)
Spoke with [facility].  Pt has already picked up the Rx from there.  Spoke with MD, he apologizes and that he remembers him telling her that but he forgot.  He also said she will need to bring that medication to the pharmacy or here and [facility].  LMOVM for her to call back. [name]

## 2014-06-07 ENCOUNTER — Other Ambulatory Visit: Payer: Self-pay | Admitting: Family Medicine

## 2014-08-08 ENCOUNTER — Other Ambulatory Visit: Payer: Self-pay | Admitting: *Deleted

## 2014-08-08 ENCOUNTER — Other Ambulatory Visit: Payer: Self-pay | Admitting: Family Medicine

## 2014-08-08 NOTE — Telephone Encounter (Signed)
Please call. She needs a PCP visit prior to Celebrex refill due to her anemia, and not wanting to refill NSAIDs without further evaluating anemia.

## 2014-08-09 NOTE — Telephone Encounter (Signed)
Pt stated she has switched practice's.  She now goes to Dr. [facility] office.  Clovis PuMartin, Tamika L, RN

## 2015-01-13 ENCOUNTER — Other Ambulatory Visit: Payer: Self-pay | Admitting: Family Medicine

## 2015-06-16 ENCOUNTER — Other Ambulatory Visit: Payer: Self-pay | Admitting: Physician Assistant

## 2015-06-16 ENCOUNTER — Encounter (HOSPITAL_BASED_OUTPATIENT_CLINIC_OR_DEPARTMENT_OTHER): Payer: Self-pay | Admitting: *Deleted

## 2015-06-16 NOTE — H&P (Signed)
  HISTORY: Ms. Brackney is a 54 year old who fell two days ago injuring her right ankle when she passed out at church.  Significant pain in the ankle since then.  Pain with weightbearing and activity, relieved by rest.  She comes in today for evaluation.  Medications that she is on are Gabapentin, Flexeril, Ibuprofen and HCTZ.  ALLERGIES:  CELEBREX.  She is status post bilateral total knee replacements which is doing well.  She is status post cervical fusion C6-7 by Dr. Franky Macho.    EXAMINATION: Well-developed, well-nourished black female in no acute distress. Alert and oriented. Examination of her right ankle reveals significant pain and swelling.  Range of motion decreased by 50%.  No wounds.  Distal neurologic function is intact.  Examination of the left ankle reveals full range of motion without pain or swelling.  Vascular exam: Pulses are 2+ and symmetric.  Neurologic exam: distal motor and sensory examination within normal limits.  Skin exam reveals no skin lesions lower extremities.  Psych exam reveals judgement and insight intact.  Respiratory exam reveals no increased respiratory effort.  Eye exam reveals EOM's intact.    X-RAYS: X-rays of the right ankle: AP, lateral and oblique show displaced distal fibula shaft fracture with widening of the mortise.  IMPRESSION: 1. Two days post acute traumatic right ankle distal fibula displaced fracture with syndesmosis disruption.  2. Three years status post bilateral total knee replacements. 3. Hypertension.   PLAN: At this point with her displaced fracture, we need to undergo ORIF of her right ankle fracture with possible syndesmosis repair.  Risks, complications, benefits of the surgery have been described to her in detail and she understands this completely.  We will plan on setting her up for this at some point in the near future.    Robert A. Thurston Hole, M.D.

## 2015-06-19 ENCOUNTER — Encounter (HOSPITAL_BASED_OUTPATIENT_CLINIC_OR_DEPARTMENT_OTHER): Payer: Self-pay | Admitting: Physician Assistant

## 2015-06-19 DIAGNOSIS — S82301A Unspecified fracture of lower end of right tibia, initial encounter for closed fracture: Secondary | ICD-10-CM | POA: Diagnosis present

## 2015-06-19 DIAGNOSIS — S82831A Other fracture of upper and lower end of right fibula, initial encounter for closed fracture: Secondary | ICD-10-CM

## 2015-06-19 DIAGNOSIS — S93439A Sprain of tibiofibular ligament of unspecified ankle, initial encounter: Secondary | ICD-10-CM | POA: Diagnosis present

## 2015-06-19 NOTE — H&P (Signed)
[name] is an 54 y.o. female.   Chief Complaint: right ankle pain HPI: Ms. [name] is a 54 year old who fell two days ago injuring her right ankle when she passed out at church.  Significant pain in the ankle since then.  Pain with weightbearing and activity, relieved by rest.  She comes in today for evaluation.  Past Medical History  Diagnosis Date  . Bronchitis   . Jones fracture     left foot fifth metatarsal  . Fibromyalgia     diagnosed 2001  . Varicose vein     protrudes above skin-per pt;vein popped and bruised;ultrasound done to make sure that there were no clots;noclots were found  . Chronic back pain     2012 tailbone surgery and 3 lower discs.   . Multiple allergies     including latex, pet dander, shellfish, pet dander  . Asthma     but pt states not severe enough to even have an inhaler  . Shortness of breath     with exertion;pt states that its related to her weight  . Hyperthyroidism     but pt doesn't take anything for it;thyroid nodules  . Headache(784.0)     "sinus headaches"  . Dizziness     rarely  . Impaired memory     states from fibromyalgia  . GERD (gastroesophageal reflux disease)     Prilosec occasionally  . IBS (irritable bowel syndrome)   . Arthritis     bilateral knees s/p knee replacement bilaterally  . Bruising     pt states unexplained d/t fibromyalgia  . Anemia   . Anxiety     takes Ativan and Valium, after mother passed  . Depression     from Fibromyalgia diagnosis; not taking medicine. since 2001  . Insomnia     takes Ambien  . Sore gums     this is why pt is on Amoxil-only takes for dental work  . Hypertension     since 2013  . Tachycardia   . Seasonal allergies   . Anemia   . Closed fracture of distal end of right fibula and tibia   . Ankle syndesmosis disruption     Past Surgical History  Procedure Laterality Date  . Spine surgery  2004    Cervical plate, ACDF  . Knee surgery  2005    Left knee arthroscopy  .  Nasal septoplasty w/ turbinoplasty  2007    due to recurrent sinusitis  . Tonsillectomy  2007  . Fracture surgery  05/08/2010    Jones fracture left foot fifth metatarsal  . Anterior lumbar fusion  09/20/2011    Procedure: ANTERIOR LUMBAR FUSION 1 LEVEL;  Surgeon: Carmela Hurt;  Location: [facility];  Service: Neurosurgery;  Laterality: N/A;  Lumbar five-Sacral One Anterior Lumbar Interbody Fusion /Dr. Early to Approach   . Cervical disc surgery      WITH TITANIUM PLATE IN NECK---LEFT SIDE  . Total knee arthroplasty  01/27/2012    Procedure: TOTAL KNEE ARTHROPLASTY;  Surgeon: Nilda Simmer, MD;  Bilateral  . Steriod injection  01/27/2012    Procedure: STEROID INJECTION;  Surgeon: Nilda Simmer, MD;  Location: [facility] OR;  Service: Orthopedics;  Laterality: Right;  . Joint replacement  01-27-2012    left total knee  . Total knee arthroplasty  04/06/2012    Procedure: TOTAL KNEE ARTHROPLASTY;  Surgeon: Nilda Simmer, MD;  Location: [facility] OR;  Service: Orthopedics;  Laterality: Right;  .  Irrigation and debridement knee  04/09/2012    Procedure: IRRIGATION AND DEBRIDEMENT KNEE;  Surgeon: Thera Flake., MD;  Location: [facility] OR;  Service: Orthopedics;  Laterality: Right;  . Uterine fibroid surgery      mid 200s  . Fibroidectomy    . Tubal ligation      Family History  Problem Relation Age of Onset  . Stroke Father     passed 2004 from pneumonia  . Anesthesia problems Neg Hx   . Hypotension Neg Hx   . Malignant hyperthermia Neg Hx   . Pseudochol deficiency Neg Hx   . Arthritis Sister   . Pulmonary embolism Mother     after minor knee surgery leading to DVT  . Alcohol abuse Father   . Depression Sister   . Hypertension Sister   . Diabetes      grandmother   Social History:  reports that she quit smoking about 16 years ago. She has never used smokeless tobacco. She reports that she does not drink alcohol or use illicit drugs.  Allergies:  Allergies  Allergen Reactions  . Shellfish  Allergy Anaphylaxis  . Latex Other (See Comments)    Itching/rash Latex glove with powder  . Other Other (See Comments)    MSG-    No prescriptions prior to admission    No results found for this or any previous visit (from the past 48 hour(s)). No results found.  Review of Systems  Constitutional: Negative.   HENT: Negative.   Eyes: Negative.   Respiratory: Negative.   Cardiovascular: Negative.   Gastrointestinal: Negative.   Genitourinary: Negative.   Musculoskeletal: Positive for back pain and joint pain.       Right ankle  Skin: Negative.   Neurological: Positive for dizziness.  Endo/Heme/Allergies: Negative.   Psychiatric/Behavioral: Negative.     Height 5\' 2"  (1.575 m), weight 101.152 kg (223 lb). Physical Exam  Constitutional: She is oriented to person, place, and time. She appears well-developed and well-nourished.  HENT:  Head: Normocephalic and atraumatic.  Mouth/Throat: Oropharynx is clear and moist.  Eyes: Conjunctivae are normal. Pupils are equal, round, and reactive to light.  Neck: Normal range of motion. Neck supple.  Cardiovascular: Normal rate.   Respiratory: Effort normal.  GI: Soft.  Genitourinary:  Not pertinent to current symptomatology therefore not examined.  Musculoskeletal:   Examination of her right ankle reveals significant pain and swelling.  Range of motion decreased by 50%.  No wounds.  Distal neurologic function is intact.  Examination of the left ankle reveals full range of motion without pain or swelling.  Vascular exam: Pulses are 2+ and symmetric.  Neurologic exam: distal motor and sensory examination within normal limits.    Neurological: She is alert and oriented to person, place, and time.  Skin: Skin is warm and dry.  Psychiatric: She has a normal mood and affect. Her behavior is normal.     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  Risks, complications, benefits of the surgery have been described to her in detail and she understands this completely.    Yareliz Thorstenson J 06/19/2015, 4:41 PM

## 2015-06-20 ENCOUNTER — Other Ambulatory Visit: Payer: Self-pay

## 2015-06-20 ENCOUNTER — Encounter (HOSPITAL_BASED_OUTPATIENT_CLINIC_OR_DEPARTMENT_OTHER)
Admission: RE | Admit: 2015-06-20 | Discharge: 2015-06-20 | Disposition: A | Discharge status: Home / Self Care | Payer: Commercial Managed Care - HMO | Source: Ambulatory Visit | Attending: Orthopedic Surgery | Admitting: Orthopedic Surgery

## 2015-06-20 DIAGNOSIS — X58XXXA Exposure to other specified factors, initial encounter: Secondary | ICD-10-CM | POA: Diagnosis not present

## 2015-06-20 DIAGNOSIS — F419 Anxiety disorder, unspecified: Secondary | ICD-10-CM | POA: Diagnosis not present

## 2015-06-20 DIAGNOSIS — Z96653 Presence of artificial knee joint, bilateral: Secondary | ICD-10-CM | POA: Diagnosis not present

## 2015-06-20 DIAGNOSIS — M797 Fibromyalgia: Secondary | ICD-10-CM | POA: Diagnosis not present

## 2015-06-20 DIAGNOSIS — S8261XA Displaced fracture of lateral malleolus of right fibula, initial encounter for closed fracture: Secondary | ICD-10-CM | POA: Diagnosis present

## 2015-06-20 DIAGNOSIS — I10 Essential (primary) hypertension: Secondary | ICD-10-CM | POA: Diagnosis not present

## 2015-06-20 DIAGNOSIS — S93421A Sprain of deltoid ligament of right ankle, initial encounter: Secondary | ICD-10-CM | POA: Diagnosis not present

## 2015-06-20 DIAGNOSIS — K219 Gastro-esophageal reflux disease without esophagitis: Secondary | ICD-10-CM | POA: Diagnosis not present

## 2015-06-20 DIAGNOSIS — Z87891 Personal history of nicotine dependence: Secondary | ICD-10-CM | POA: Diagnosis not present

## 2015-06-20 DIAGNOSIS — S82831A Other fracture of upper and lower end of right fibula, initial encounter for closed fracture: Secondary | ICD-10-CM | POA: Diagnosis not present

## 2015-06-20 DIAGNOSIS — Z6841 Body Mass Index (BMI) 40.0 and over, adult: Secondary | ICD-10-CM | POA: Diagnosis not present

## 2015-06-21 ENCOUNTER — Ambulatory Visit (HOSPITAL_BASED_OUTPATIENT_CLINIC_OR_DEPARTMENT_OTHER)
Admission: RE | Admit: 2015-06-21 | Discharge: 2015-06-21 | Disposition: A | Discharge status: Home / Self Care | Payer: Commercial Managed Care - HMO | Source: Ambulatory Visit | Attending: Orthopedic Surgery | Admitting: Orthopedic Surgery

## 2015-06-21 ENCOUNTER — Ambulatory Visit (HOSPITAL_BASED_OUTPATIENT_CLINIC_OR_DEPARTMENT_OTHER): Payer: Commercial Managed Care - HMO | Admitting: Anesthesiology

## 2015-06-21 ENCOUNTER — Encounter (HOSPITAL_BASED_OUTPATIENT_CLINIC_OR_DEPARTMENT_OTHER)
Admission: RE | Disposition: A | Discharge status: Home / Self Care | Payer: Self-pay | Source: Ambulatory Visit | Attending: Orthopedic Surgery

## 2015-06-21 ENCOUNTER — Encounter (HOSPITAL_BASED_OUTPATIENT_CLINIC_OR_DEPARTMENT_OTHER): Payer: Self-pay | Admitting: *Deleted

## 2015-06-21 DIAGNOSIS — M51369 Other intervertebral disc degeneration, lumbar region without mention of lumbar back pain or lower extremity pain: Secondary | ICD-10-CM | POA: Diagnosis present

## 2015-06-21 DIAGNOSIS — F419 Anxiety disorder, unspecified: Secondary | ICD-10-CM | POA: Diagnosis present

## 2015-06-21 DIAGNOSIS — Z96659 Presence of unspecified artificial knee joint: Secondary | ICD-10-CM

## 2015-06-21 DIAGNOSIS — S82831A Other fracture of upper and lower end of right fibula, initial encounter for closed fracture: Secondary | ICD-10-CM | POA: Diagnosis not present

## 2015-06-21 DIAGNOSIS — F329 Major depressive disorder, single episode, unspecified: Secondary | ICD-10-CM | POA: Diagnosis present

## 2015-06-21 DIAGNOSIS — M797 Fibromyalgia: Secondary | ICD-10-CM | POA: Diagnosis present

## 2015-06-21 DIAGNOSIS — Z87891 Personal history of nicotine dependence: Secondary | ICD-10-CM | POA: Insufficient documentation

## 2015-06-21 DIAGNOSIS — I10 Essential (primary) hypertension: Secondary | ICD-10-CM | POA: Diagnosis present

## 2015-06-21 DIAGNOSIS — K219 Gastro-esophageal reflux disease without esophagitis: Secondary | ICD-10-CM | POA: Diagnosis present

## 2015-06-21 DIAGNOSIS — S82301A Unspecified fracture of lower end of right tibia, initial encounter for closed fracture: Secondary | ICD-10-CM | POA: Diagnosis present

## 2015-06-21 DIAGNOSIS — M1711 Unilateral primary osteoarthritis, right knee: Secondary | ICD-10-CM | POA: Diagnosis present

## 2015-06-21 DIAGNOSIS — F32A Depression, unspecified: Secondary | ICD-10-CM | POA: Diagnosis present

## 2015-06-21 DIAGNOSIS — J302 Other seasonal allergic rhinitis: Secondary | ICD-10-CM | POA: Diagnosis present

## 2015-06-21 DIAGNOSIS — S93421A Sprain of deltoid ligament of right ankle, initial encounter: Secondary | ICD-10-CM | POA: Diagnosis not present

## 2015-06-21 DIAGNOSIS — D649 Anemia, unspecified: Secondary | ICD-10-CM | POA: Diagnosis present

## 2015-06-21 DIAGNOSIS — Z6841 Body Mass Index (BMI) 40.0 and over, adult: Secondary | ICD-10-CM | POA: Insufficient documentation

## 2015-06-21 DIAGNOSIS — G8929 Other chronic pain: Secondary | ICD-10-CM | POA: Diagnosis present

## 2015-06-21 DIAGNOSIS — Z96653 Presence of artificial knee joint, bilateral: Secondary | ICD-10-CM | POA: Insufficient documentation

## 2015-06-21 DIAGNOSIS — M549 Dorsalgia, unspecified: Secondary | ICD-10-CM

## 2015-06-21 DIAGNOSIS — X58XXXA Exposure to other specified factors, initial encounter: Secondary | ICD-10-CM | POA: Insufficient documentation

## 2015-06-21 DIAGNOSIS — M5136 Other intervertebral disc degeneration, lumbar region: Secondary | ICD-10-CM | POA: Diagnosis present

## 2015-06-21 DIAGNOSIS — S93439A Sprain of tibiofibular ligament of unspecified ankle, initial encounter: Secondary | ICD-10-CM | POA: Diagnosis present

## 2015-06-21 HISTORY — DX: Sprain of tibiofibular ligament of unspecified ankle, initial encounter: S93.439A

## 2015-06-21 HISTORY — DX: Other fracture of upper and lower end of right fibula, initial encounter for closed fracture: S82.831A

## 2015-06-21 HISTORY — PX: ORIF ANKLE FRACTURE: SHX5408

## 2015-06-21 HISTORY — DX: Unspecified fracture of lower end of right tibia, initial encounter for closed fracture: S82.301A

## 2015-06-21 LAB — POCT HEMOGLOBIN-HEMACUE: Hemoglobin: 14.2 g/dL (ref 12.0–15.0)

## 2015-06-21 SURGERY — OPEN REDUCTION INTERNAL FIXATION (ORIF) ANKLE FRACTURE
Anesthesia: Regional | Site: Ankle | Laterality: Right

## 2015-06-21 MED ORDER — LIDOCAINE HCL (CARDIAC) 20 MG/ML IV SOLN
INTRAVENOUS | Status: AC
  Filled 2015-06-21: qty 5

## 2015-06-21 MED ORDER — BUPIVACAINE HCL (PF) 0.5 % IJ SOLN
INTRAMUSCULAR | Status: AC
  Filled 2015-06-21: qty 30

## 2015-06-21 MED ORDER — FENTANYL CITRATE (PF) 100 MCG/2ML IJ SOLN
INTRAMUSCULAR | Status: AC
  Filled 2015-06-21: qty 2

## 2015-06-21 MED ORDER — FENTANYL CITRATE (PF) 100 MCG/2ML IJ SOLN
50.0000 ug | INTRAMUSCULAR | Status: DC | PRN
  Administered 2015-06-21: 50 ug via INTRAVENOUS
  Administered 2015-06-21: 100 ug via INTRAVENOUS

## 2015-06-21 MED ORDER — CEFAZOLIN SODIUM-DEXTROSE 2-3 GM-% IV SOLR
INTRAVENOUS | Status: AC
  Filled 2015-06-21: qty 50

## 2015-06-21 MED ORDER — BUPIVACAINE HCL (PF) 0.25 % IJ SOLN
INTRAMUSCULAR | Status: AC
  Filled 2015-06-21: qty 30

## 2015-06-21 MED ORDER — ONDANSETRON HCL 4 MG/2ML IJ SOLN
INTRAMUSCULAR | Status: DC | PRN
  Administered 2015-06-21: 4 mg via INTRAVENOUS

## 2015-06-21 MED ORDER — SCOPOLAMINE 1 MG/3DAYS TD PT72: 1.0000 | MEDICATED_PATCH | Freq: Once | TRANSDERMAL | Status: DC | PRN

## 2015-06-21 MED ORDER — CEFAZOLIN SODIUM-DEXTROSE 2-3 GM-% IV SOLR
2.0000 g | INTRAVENOUS | Status: AC
  Administered 2015-06-21: 2 g via INTRAVENOUS

## 2015-06-21 MED ORDER — LACTATED RINGERS IV SOLN
INTRAVENOUS | Status: DC
Start: 2015-06-21 — End: 2015-06-21
  Administered 2015-06-21 (×2): via INTRAVENOUS

## 2015-06-21 MED ORDER — ONDANSETRON HCL 4 MG/2ML IJ SOLN
INTRAMUSCULAR | Status: AC
  Filled 2015-06-21: qty 2

## 2015-06-21 MED ORDER — OXYCODONE-ACETAMINOPHEN 10-325 MG PO TABS: ORAL_TABLET | ORAL | Status: DC

## 2015-06-21 MED ORDER — LIDOCAINE HCL (CARDIAC) 20 MG/ML IV SOLN
INTRAVENOUS | Status: DC | PRN
  Administered 2015-06-21: 50 mg via INTRAVENOUS

## 2015-06-21 MED ORDER — OXYCODONE HCL 5 MG PO TABS
ORAL_TABLET | ORAL | Status: AC
  Filled 2015-06-21: qty 1

## 2015-06-21 MED ORDER — LACTATED RINGERS IV SOLN: INTRAVENOUS | Status: DC

## 2015-06-21 MED ORDER — DEXAMETHASONE SODIUM PHOSPHATE 10 MG/ML IJ SOLN
INTRAMUSCULAR | Status: AC
  Filled 2015-06-21: qty 1

## 2015-06-21 MED ORDER — HYDROMORPHONE HCL 1 MG/ML IJ SOLN
INTRAMUSCULAR | Status: AC
  Filled 2015-06-21: qty 1

## 2015-06-21 MED ORDER — PROPOFOL 500 MG/50ML IV EMUL
INTRAVENOUS | Status: AC
  Filled 2015-06-21: qty 50

## 2015-06-21 MED ORDER — HYDROMORPHONE HCL 1 MG/ML IJ SOLN
0.2500 mg | INTRAMUSCULAR | Status: DC | PRN
  Administered 2015-06-21 (×2): 0.5 mg via INTRAVENOUS

## 2015-06-21 MED ORDER — FENTANYL CITRATE (PF) 100 MCG/2ML IJ SOLN
INTRAMUSCULAR | Status: AC
  Filled 2015-06-21: qty 4

## 2015-06-21 MED ORDER — DEXAMETHASONE SODIUM PHOSPHATE 10 MG/ML IJ SOLN
INTRAMUSCULAR | Status: DC | PRN
  Administered 2015-06-21: 10 mg via INTRAVENOUS

## 2015-06-21 MED ORDER — MIDAZOLAM HCL 2 MG/2ML IJ SOLN
INTRAMUSCULAR | Status: AC
  Filled 2015-06-21: qty 2

## 2015-06-21 MED ORDER — GLYCOPYRROLATE 0.2 MG/ML IJ SOLN: 0.2000 mg | Freq: Once | INTRAMUSCULAR | Status: DC | PRN

## 2015-06-21 MED ORDER — OXYCODONE HCL 5 MG/5ML PO SOLN: 5.0000 mg | Freq: Once | ORAL | Status: AC | PRN

## 2015-06-21 MED ORDER — PROPOFOL 10 MG/ML IV BOLUS
INTRAVENOUS | Status: DC | PRN
  Administered 2015-06-21: 10 mg via INTRAVENOUS
  Administered 2015-06-21: 200 mg via INTRAVENOUS
  Administered 2015-06-21 (×2): 10 mg via INTRAVENOUS

## 2015-06-21 MED ORDER — CHLORHEXIDINE GLUCONATE 4 % EX LIQD: 60.0000 mL | Freq: Once | CUTANEOUS | Status: DC

## 2015-06-21 MED ORDER — MIDAZOLAM HCL 2 MG/2ML IJ SOLN
1.0000 mg | INTRAMUSCULAR | Status: DC | PRN
  Administered 2015-06-21: 2 mg via INTRAVENOUS

## 2015-06-21 MED ORDER — ONDANSETRON HCL 4 MG/2ML IJ SOLN: 4.0000 mg | Freq: Four times a day (QID) | INTRAMUSCULAR | Status: DC | PRN

## 2015-06-21 MED ORDER — BUPIVACAINE-EPINEPHRINE (PF) 0.5% -1:200000 IJ SOLN
INTRAMUSCULAR | Status: DC | PRN
  Administered 2015-06-21: 30 mL via PERINEURAL

## 2015-06-21 MED ORDER — OXYCODONE HCL 5 MG PO TABS
5.0000 mg | ORAL_TABLET | Freq: Once | ORAL | Status: AC | PRN
  Administered 2015-06-21: 5 mg via ORAL

## 2015-06-21 SURGICAL SUPPLY — 92 items
BAG DECANTER FOR FLEXI CONT (MISCELLANEOUS) IMPLANT
BANDAGE ELASTIC 4 VELCRO ST LF (GAUZE/BANDAGES/DRESSINGS) ×2 IMPLANT
BANDAGE ELASTIC 6 VELCRO ST LF (GAUZE/BANDAGES/DRESSINGS) ×3 IMPLANT
BANDAGE ESMARK 6X9 LF (GAUZE/BANDAGES/DRESSINGS) IMPLANT
BIT DRILL 2.5 CANN ENDOSCOPIC (BIT) ×2 IMPLANT
BIT DRILL 2.5 CANN LNG (BIT) ×2 IMPLANT
BLADE SURG 15 STRL LF DISP TIS (BLADE) ×1 IMPLANT
BLADE SURG 15 STRL SS (BLADE) ×6
BNDG CMPR 9X4 STRL LF SNTH (GAUZE/BANDAGES/DRESSINGS) ×1
BNDG CMPR 9X6 STRL LF SNTH (GAUZE/BANDAGES/DRESSINGS)
BNDG COHESIVE 4X5 TAN STRL (GAUZE/BANDAGES/DRESSINGS) ×3 IMPLANT
BNDG ESMARK 4X9 LF (GAUZE/BANDAGES/DRESSINGS) ×3 IMPLANT
BNDG ESMARK 6X9 LF (GAUZE/BANDAGES/DRESSINGS)
BNDG GAUZE ELAST 4 BULKY (GAUZE/BANDAGES/DRESSINGS) ×3 IMPLANT
CANISTER SUCT 1200ML W/VALVE (MISCELLANEOUS) ×2 IMPLANT
CLOSURE WOUND 1/2 X4 (GAUZE/BANDAGES/DRESSINGS)
CLOTH 2% CHLOROHEXIDINE 3PK (PERSONAL CARE ITEMS) ×2 IMPLANT
COVER BACK TABLE 60X90IN (DRAPES) ×3 IMPLANT
COVER MAYO STAND STRL (DRAPES) ×3 IMPLANT
CUFF TOURNIQUET SINGLE 24IN (TOURNIQUET CUFF) ×2 IMPLANT
CUFF TOURNIQUET SINGLE 34IN LL (TOURNIQUET CUFF) IMPLANT
DRAPE EXTREMITY T 121X128X90 (DRAPE) ×3 IMPLANT
DRAPE OEC MINIVIEW 54X84 (DRAPES) ×3 IMPLANT
DRAPE U 20/CS (DRAPES) ×3 IMPLANT
DRAPE U-SHAPE 47X51 STRL (DRAPES) ×3 IMPLANT
DRSG PAD ABDOMINAL 8X10 ST (GAUZE/BANDAGES/DRESSINGS) IMPLANT
DURAPREP 26ML APPLICATOR (WOUND CARE) ×3 IMPLANT
ELECT REM PT RETURN 9FT ADLT (ELECTROSURGICAL) ×3
ELECTRODE REM PT RTRN 9FT ADLT (ELECTROSURGICAL) ×1 IMPLANT
GAUZE SPONGE 4X4 12PLY STRL (GAUZE/BANDAGES/DRESSINGS) ×3 IMPLANT
GAUZE SPONGE 4X4 16PLY XRAY LF (GAUZE/BANDAGES/DRESSINGS) IMPLANT
GAUZE XEROFORM 1X8 LF (GAUZE/BANDAGES/DRESSINGS) IMPLANT
GLOVE BIO SURGEON STRL SZ7 (GLOVE) ×3 IMPLANT
GLOVE BIOGEL M STRL SZ7.5 (GLOVE) ×2 IMPLANT
GLOVE BIOGEL PI IND STRL 7.0 (GLOVE) ×1 IMPLANT
GLOVE BIOGEL PI IND STRL 7.5 (GLOVE) ×1 IMPLANT
GLOVE BIOGEL PI INDICATOR 7.0 (GLOVE) ×2
GLOVE BIOGEL PI INDICATOR 7.5 (GLOVE) ×2
GLOVE SS BIOGEL STRL SZ 7.5 (GLOVE) ×1 IMPLANT
GLOVE SUPERSENSE BIOGEL SZ 7.5 (GLOVE) ×2
GLOVE SURG SS PI 6.5 STRL IVOR (GLOVE) ×2 IMPLANT
GLOVE SURG SS PI 7.0 STRL IVOR (GLOVE) ×2 IMPLANT
GLOVE SURG SS PI 7.5 STRL IVOR (GLOVE) ×4 IMPLANT
GOWN STRL REUS W/ TWL LRG LVL3 (GOWN DISPOSABLE) ×3 IMPLANT
GOWN STRL REUS W/ TWL XL LVL3 (GOWN DISPOSABLE) IMPLANT
GOWN STRL REUS W/TWL LRG LVL3 (GOWN DISPOSABLE) ×9
GOWN STRL REUS W/TWL XL LVL3 (GOWN DISPOSABLE) ×12
NDL 1/2 CIR CATGUT .05X1.09 (NEEDLE) IMPLANT
NDL HYPO 25X1 1.5 SAFETY (NEEDLE) IMPLANT
NDL MAYO TROCAR (NEEDLE) IMPLANT
NEEDLE 1/2 CIR CATGUT .05X1.09 (NEEDLE) IMPLANT
NEEDLE HYPO 22GX1.5 SAFETY (NEEDLE) IMPLANT
NEEDLE HYPO 25X1 1.5 SAFETY (NEEDLE) IMPLANT
NEEDLE MAYO TROCAR (NEEDLE) IMPLANT
PACK BASIN DAY SURGERY FS (CUSTOM PROCEDURE TRAY) ×3 IMPLANT
PAD CAST 3X4 CTTN HI CHSV (CAST SUPPLIES) IMPLANT
PAD CAST 4YDX4 CTTN HI CHSV (CAST SUPPLIES) ×1 IMPLANT
PADDING CAST ABS 4INX4YD NS (CAST SUPPLIES) ×2
PADDING CAST ABS COTTON 4X4 ST (CAST SUPPLIES) ×1 IMPLANT
PADDING CAST COTTON 3X4 STRL (CAST SUPPLIES)
PADDING CAST COTTON 4X4 STRL (CAST SUPPLIES) ×3
PENCIL BUTTON HOLSTER BLD 10FT (ELECTRODE) ×3 IMPLANT
PLATE THIRD TUBULAR 7 HOLE (Plate) ×2 IMPLANT
REPAIR TROPE KNTLS SS SYNDESMO (Orthopedic Implant) ×2 IMPLANT
SCREW LOW PROFILE 3.5X14 (Screw) ×4 IMPLANT
SCREW LOW PROFILE 3.5X16 (Screw) ×2 IMPLANT
SCREW NLOCK T15 FT 18X3.5XST (Screw) IMPLANT
SCREW NON LOCK 3.5X18MM (Screw) ×3 IMPLANT
SCREW NON LOCKING 4.0X20 (Screw) ×2 IMPLANT
SCREW NON-LOCKING 3.5X12MM (Screw) ×2 IMPLANT
SLEEVE SCD COMPRESS KNEE MED (MISCELLANEOUS) ×2 IMPLANT
STAPLER VISISTAT 35W (STAPLE) IMPLANT
STOCKINETTE 6  STRL (DRAPES) ×2
STOCKINETTE 6 STRL (DRAPES) ×1 IMPLANT
STOCKINETTE IMPERVIOUS LG (DRAPES) ×3 IMPLANT
STRIP CLOSURE SKIN 1/2X4 (GAUZE/BANDAGES/DRESSINGS) IMPLANT
SUCTION FRAZIER TIP 10 FR DISP (SUCTIONS) IMPLANT
SUT ETHILON 4 0 PS 2 18 (SUTURE) IMPLANT
SUT MNCRL AB 3-0 PS2 18 (SUTURE) ×2 IMPLANT
SUT PROLENE 3 0 PS 2 (SUTURE) IMPLANT
SUT VIC AB 0 CT1 27 (SUTURE)
SUT VIC AB 0 CT1 27XBRD ANBCTR (SUTURE) IMPLANT
SUT VIC AB 2-0 SH 27 (SUTURE)
SUT VIC AB 2-0 SH 27XBRD (SUTURE) IMPLANT
SUT VIC AB 3-0 FS2 27 (SUTURE) IMPLANT
SUT VICRYL 4-0 PS2 18IN ABS (SUTURE) IMPLANT
SYR BULB 3OZ (MISCELLANEOUS) ×3 IMPLANT
SYR CONTROL 10ML LL (SYRINGE) IMPLANT
TOWEL OR NON WOVEN STRL DISP B (DISPOSABLE) ×3 IMPLANT
TUBE CONNECTING 20'X1/4 (TUBING) ×1
TUBE CONNECTING 20X1/4 (TUBING) ×1 IMPLANT
YANKAUER SUCT BULB TIP NO VENT (SUCTIONS) ×2 IMPLANT

## 2015-06-21 NOTE — Interval H&P Note (Signed)
History and Physical Interval Note:  06/21/2015 8:07 AM  [name]  has presented today for surgery, with the diagnosis of RIGHT DISTAL TIBIA FIBULA FRACTURE   The various methods of treatment have been discussed with the patient and family. After consideration of risks, benefits and other options for treatment, the patient has consented to  Procedure(s): OPEN REDUCTION INTERNAL FIXATION RIGHT DISTAL FIBULA FRACTURE AND OPEN REDUCTION INTERNAL FIXATION SYNDESMOSIS  (Right) as a surgical intervention .  The patient's history has been reviewed, patient examined, no change in status, stable for surgery.  I have reviewed the patient's chart and labs.  Questions were answered to the patient's satisfaction.     Salvatore Marvel A

## 2015-06-21 NOTE — Anesthesia Procedure Notes (Addendum)
Anesthesia Regional Block:  Popliteal block  Pre-Anesthetic Checklist: ,, timeout performed, Correct Patient, Correct Site, Correct Laterality, Correct Procedure, Correct Position, site marked, Risks and benefits discussed,  Surgical consent,  Pre-op evaluation,  At surgeon's request and post-op pain management  Laterality: Right  Prep: chloraprep       Needles:  Injection technique: Single-shot  Needle Type: Echogenic Stimulator Needle     Needle Length: 9cm 9 cm Needle Gauge: 21 and 21 G    Additional Needles:  Procedures: ultrasound guided (picture in chart) and nerve stimulator Popliteal block  Nerve Stimulator or Paresthesia:  Response: plantar flexion of foot, 0.45 mA,   Additional Responses:   Narrative:  Start time: 06/21/2015 11:43 AM End time: 06/21/2015 11:51 AM Injection made incrementally with aspirations every 5 mL.  Performed by: Personally  Anesthesiologist: HODIERNE, ADAM  Additional Notes: Functioning IV was confirmed and monitors were applied.  A 90mm 21ga Arrow echogenic stimulator needle was used. Sterile prep and drape,hand hygiene and sterile gloves were used.  Negative aspiration and negative test dose prior to incremental administration of local anesthetic. The patient tolerated the procedure well.  Ultrasound guidance: relevent anatomy identified, needle position confirmed, local anesthetic spread visualized around nerve(s), vascular puncture avoided.  Image printed for medical record.    Procedure Name: LMA Insertion Date/Time: 06/21/2015 12:13 PM Performed by: Caren Macadam Pre-anesthesia Checklist: Patient identified, Emergency Drugs available, Suction available and Patient being monitored Patient Re-evaluated:Patient Re-evaluated prior to inductionOxygen Delivery Method: Circle System Utilized Preoxygenation: Pre-oxygenation with 100% oxygen Intubation Type: IV induction Ventilation: Mask ventilation without difficulty LMA: LMA  inserted LMA Size: 4.0 Number of attempts: 1 Airway Equipment and Method: Bite block Placement Confirmation: positive ETCO2 and breath sounds checked- equal and bilateral Tube secured with: Tape Dental Injury: Teeth and Oropharynx as per pre-operative assessment

## 2015-06-21 NOTE — Progress Notes (Signed)
Assisted Dr. Hodierne with right, ultrasound guided, popliteal block. Side rails up, monitors on throughout procedure. See vital signs in flow sheet. Tolerated Procedure well. 

## 2015-06-21 NOTE — Anesthesia Preprocedure Evaluation (Signed)
Anesthesia Evaluation  Patient identified by MRN, date of birth, ID band Patient awake    Reviewed: Allergy & Precautions, NPO status , Patient's Chart, lab work & pertinent test results  Airway Mallampati: II   Neck ROM: full    Dental   Pulmonary asthma , former smoker,    breath sounds clear to auscultation       Cardiovascular hypertension,  Rhythm:regular Rate:Normal     Neuro/Psych  Headaches, PSYCHIATRIC DISORDERS Anxiety Depression  Neuromuscular disease    GI/Hepatic GERD  ,  Endo/Other  Hyperthyroidism Morbid obesity  Renal/GU      Musculoskeletal  (+) Arthritis , Fibromyalgia -  Abdominal   Peds  Hematology   Anesthesia Other Findings   Reproductive/Obstetrics                             Anesthesia Physical Anesthesia Plan  ASA: III  Anesthesia Plan: General and Regional   Post-op Pain Management:    Induction: Intravenous  Airway Management Planned: LMA  Additional Equipment:   Intra-op Plan:   Post-operative Plan:   Informed Consent: I have reviewed the patients History and Physical, chart, labs and discussed the procedure including the risks, benefits and alternatives for the proposed anesthesia with the patient or authorized representative who has indicated his/her understanding and acceptance.     Plan Discussed with: CRNA, Anesthesiologist and Surgeon  Anesthesia Plan Comments:         Anesthesia Quick Evaluation

## 2015-06-21 NOTE — Discharge Instructions (Signed)
°  Post Anesthesia Home Care Instructions ° °Activity: °Get plenty of rest for the remainder of the day. A responsible adult should stay with you for 24 hours following the procedure.  °For the next 24 hours, DO NOT: °-Drive a car °-Operate machinery °-Drink alcoholic beverages °-Take any medication unless instructed by your physician °-Make any legal decisions or sign important papers. ° °Meals: °Start with liquid foods such as gelatin or soup. Progress to regular foods as tolerated. Avoid greasy, spicy, heavy foods. If nausea and/or vomiting occur, drink only clear liquids until the nausea and/or vomiting subsides. Call your physician if vomiting continues. ° °Special Instructions/Symptoms: °Your throat may feel dry or sore from the anesthesia or the breathing tube placed in your throat during surgery. If this causes discomfort, gargle with warm salt water. The discomfort should disappear within 24 hours. ° °If you had a scopolamine patch placed behind your ear for the management of post- operative nausea and/or vomiting: ° °1. The medication in the patch is effective for 72 hours, after which it should be removed.  Wrap patch in a tissue and discard in the trash. Wash hands thoroughly with soap and water. °2. You may remove the patch earlier than 72 hours if you experience unpleasant side effects which may include dry mouth, dizziness or visual disturbances. °3. Avoid touching the patch. Wash your hands with soap and water after contact with the patch. °  °Regional Anesthesia Blocks ° °1. Numbness or the inability to move the "blocked" extremity may last from 3-48 hours after placement. The length of time depends on the medication injected and your individual response to the medication. If the numbness is not going away after 48 hours, call your surgeon. ° °2. The extremity that is blocked will need to be protected until the numbness is gone and the  Strength has returned. Because you cannot feel it, you will need  to take extra care to avoid injury. Because it may be weak, you may have difficulty moving it or using it. You may not know what position it is in without looking at it while the block is in effect. ° °3. For blocks in the legs and feet, returning to weight bearing and walking needs to be done carefully. You will need to wait until the numbness is entirely gone and the strength has returned. You should be able to move your leg and foot normally before you try and bear weight or walk. You will need someone to be with you when you first try to ensure you do not fall and possibly risk injury. ° °4. Bruising and tenderness at the needle site are common side effects and will resolve in a few days. ° °5. Persistent numbness or new problems with movement should be communicated to the surgeon or the [facility] ([phone])/ [facility] ([phone]). °

## 2015-06-21 NOTE — Transfer of Care (Signed)
Immediate Anesthesia Transfer of Care Note  Patient: [name]  Procedure(s) Performed: Procedure(s): OPEN REDUCTION INTERNAL FIXATION RIGHT DISTAL FIBULA  FRACTURE AND OPEN REDUCTION INTERNAL FIXATION SYNDESMOSIS  (Right)  Patient Location: PACU  Anesthesia Type:General and GA combined with regional for post-op pain  Level of Consciousness: awake and alert   Airway & Oxygen Therapy: Patient Spontanous Breathing and Patient connected to face mask oxygen  Post-op Assessment: Report given to RN and Post -op Vital signs reviewed and stable  Post vital signs: Reviewed and stable  Last Vitals:  Filed Vitals:   06/21/15 1152  BP:   Pulse: 82  Temp:   Resp: 15    Complications: No apparent anesthesia complications

## 2015-06-22 ENCOUNTER — Encounter (HOSPITAL_BASED_OUTPATIENT_CLINIC_OR_DEPARTMENT_OTHER): Payer: Self-pay | Admitting: Orthopedic Surgery

## 2015-06-22 NOTE — Op Note (Signed)
NAMEMONSERRATT, KNEZEVIC               ACCOUNT NO.:  [id number]  MEDICAL RECORD NO.:  [id number]  LOCATION:                               FACILITY:  [facility]  PHYSICIAN:  Louine Tenpenny A. Thurston Hole, M.D. DATE OF BIRTH:  04-27-1961  DATE OF PROCEDURE:  06/21/2015 DATE OF DISCHARGE:  06/21/2015                              OPERATIVE REPORT   PREOPERATIVE DIAGNOSES: 1. Right ankle acute traumatic distal fibula, displaced fracture. 2. Right ankle acute traumatic syndesmosis disruption.  POSTOPERATIVE DIAGNOSES: 1. Right ankle acute traumatic distal fibula, displaced fracture. 2. Right ankle acute traumatic syndesmosis disruption.  PROCEDURE: 1. Open reduction and internal fixation of right ankle distal fibula     fracture. 2. Open reduction and internal fixation of right ankle syndesmosis     disruption.  SURGEON:  Elana Alm. Thurston Hole, MD  ASSISTANT:  Julien Girt, PA-C  ANESTHESIA:  General.  OPERATIVE TIME:  One hour.  COMPLICATIONS:  None.  INDICATION FOR PROCEDURE:  [name] is a 54 year old woman who sustained a twisting injury to a right angle with a displaced distal fibula fracture and syndesmosis disruption a week ago.  Exam and x-rays have confirmed this and she is now to undergo open reduction and internal fixation of this.  DESCRIPTION:  Ms. Windom was brought to the operating room on June 21, 2015, placed on operative table in supine position.  After being placed under general anesthesia, her right leg and ankle was prepped using sterile DuraPrep and draped using sterile technique.  Time-out procedure was called and the correct right ankle identified.  Right leg was exsanguinated and a calf tourniquet elevated to 250 mm.  Initially, through a 6 cm longitudinal incision based over the distal fibula, initial exposure was made.  Underlying subcutaneous tissues were incised along with skin incision.  Peroneal tendons and sural nerve carefully protected while the fibula  fracture was exposed.  The fracture was then reduced with a reduction clamp.  An anterior to posterior lag screw was placed using standard technique provisionally holding the fracture in place.  At this point, a 7-hole 1/3rd tubular plate was placed on the lateral aspect of the fracture.  With the most distal screw holes were drilled, measured, and the appropriate length 3.5 mm cortical screw was placed and the one most distal hole was drilled, measured, and appropriate length 3.5 mm cortical screw placed.  Then the screw hole just distal to the fracture site had screw placed as well.  At this point, intraoperative fluoroscopy confirmed anatomic reduction of the fracture; however there was still some instability of the syndesmosis and thus it was felt that a tight rope fixation for syndesmosis fixation would be indicated.  At this point, then the tight rope was predrilled and then deployed under fluoroscopic control and once it was tightened down with the syndesmosis in place, it gave excellent reduction and stability to the syndesmosis under fluoroscopic control.  At this point, it was felt that all pathology had been satisfactorily addressed.  The wound was irrigated and then closed using 0 Vicryl to close the fascia over the fibula fracture and the plate.  Subcutaneous tissue was closed with 2-0 Vicryl, subcuticularly I closed  with 4-0 Monocryl and sterile dressings and a short-leg splint applied.  Tourniquet was released and the patient awakened and taken to recovery room in stable condition. Needle and sponge count was correct x2 at the end of the case.  FOLLOWUP CARE:  Ms. Spranger will be followed as an outpatient, on oxycodone for pain.  She will see me back in the office in a week for wound check and followup.     Sakina Briones A. Thurston Hole, M.D.     RAW/MEDQ  D:  06/22/2015  T:  06/22/2015  Job:  [id number]

## 2015-06-30 NOTE — Anesthesia Postprocedure Evaluation (Signed)
Anesthesia Post Note  Patient: [name]  Procedure(s) Performed: Procedure(s) (LRB): OPEN REDUCTION INTERNAL FIXATION RIGHT DISTAL FIBULA  FRACTURE AND OPEN REDUCTION INTERNAL FIXATION SYNDESMOSIS  (Right)  Anesthesia type: General  Patient location: PACU  Post pain: Pain level controlled and Adequate analgesia  Post assessment: Post-op Vital signs reviewed, Patient's Cardiovascular Status Stable, Respiratory Function Stable, Patent Airway and Pain level controlled  Last Vitals:  Filed Vitals:   06/21/15 1515  BP: 157/79  Pulse: 80  Temp: 36.7 C  Resp: 20    Post vital signs: Reviewed and stable  Level of consciousness: awake, alert  and oriented  Complications: No apparent anesthesia complications

## 2015-10-10 DIAGNOSIS — R262 Difficulty in walking, not elsewhere classified: Secondary | ICD-10-CM | POA: Diagnosis not present

## 2015-10-10 DIAGNOSIS — S8264XD Nondisplaced fracture of lateral malleolus of right fibula, subsequent encounter for closed fracture with routine healing: Secondary | ICD-10-CM | POA: Diagnosis not present

## 2015-10-10 DIAGNOSIS — M25571 Pain in right ankle and joints of right foot: Secondary | ICD-10-CM | POA: Diagnosis not present

## 2015-10-10 DIAGNOSIS — M25671 Stiffness of right ankle, not elsewhere classified: Secondary | ICD-10-CM | POA: Diagnosis not present

## 2015-10-12 DIAGNOSIS — M722 Plantar fascial fibromatosis: Secondary | ICD-10-CM | POA: Diagnosis not present

## 2015-11-14 DIAGNOSIS — M25671 Stiffness of right ankle, not elsewhere classified: Secondary | ICD-10-CM | POA: Diagnosis not present

## 2015-11-23 DIAGNOSIS — J45909 Unspecified asthma, uncomplicated: Secondary | ICD-10-CM | POA: Diagnosis not present

## 2015-11-23 DIAGNOSIS — M542 Cervicalgia: Secondary | ICD-10-CM | POA: Diagnosis not present

## 2015-11-23 DIAGNOSIS — I10 Essential (primary) hypertension: Secondary | ICD-10-CM | POA: Diagnosis not present

## 2015-11-23 DIAGNOSIS — Z0001 Encounter for general adult medical examination with abnormal findings: Secondary | ICD-10-CM | POA: Diagnosis not present

## 2015-11-27 ENCOUNTER — Other Ambulatory Visit: Payer: Self-pay | Admitting: Internal Medicine

## 2015-11-27 DIAGNOSIS — Z1231 Encounter for screening mammogram for malignant neoplasm of breast: Secondary | ICD-10-CM

## 2015-11-28 DIAGNOSIS — M797 Fibromyalgia: Secondary | ICD-10-CM | POA: Diagnosis not present

## 2015-11-28 DIAGNOSIS — M545 Low back pain: Secondary | ICD-10-CM | POA: Diagnosis not present

## 2015-11-28 DIAGNOSIS — M130 Polyarthritis, unspecified: Secondary | ICD-10-CM | POA: Diagnosis not present

## 2015-11-28 DIAGNOSIS — Z79891 Long term (current) use of opiate analgesic: Secondary | ICD-10-CM | POA: Diagnosis not present

## 2015-11-28 DIAGNOSIS — F064 Anxiety disorder due to known physiological condition: Secondary | ICD-10-CM | POA: Diagnosis not present

## 2015-12-11 ENCOUNTER — Ambulatory Visit
Admission: RE | Admit: 2015-12-11 | Discharge: 2015-12-11 | Disposition: A | Discharge status: Home / Self Care | Payer: PPO | Source: Ambulatory Visit | Attending: Internal Medicine | Admitting: Internal Medicine

## 2015-12-11 DIAGNOSIS — Z1231 Encounter for screening mammogram for malignant neoplasm of breast: Secondary | ICD-10-CM

## 2015-12-12 ENCOUNTER — Other Ambulatory Visit: Payer: Self-pay | Admitting: Family Medicine

## 2015-12-12 ENCOUNTER — Other Ambulatory Visit (HOSPITAL_COMMUNITY)
Admission: RE | Admit: 2015-12-12 | Discharge: 2015-12-12 | Disposition: A | Discharge status: Home / Self Care | Payer: PPO | Source: Ambulatory Visit | Attending: Family Medicine | Admitting: Family Medicine

## 2015-12-12 DIAGNOSIS — M797 Fibromyalgia: Secondary | ICD-10-CM | POA: Diagnosis not present

## 2015-12-12 DIAGNOSIS — Z124 Encounter for screening for malignant neoplasm of cervix: Secondary | ICD-10-CM | POA: Insufficient documentation

## 2015-12-12 DIAGNOSIS — Z6841 Body Mass Index (BMI) 40.0 and over, adult: Secondary | ICD-10-CM | POA: Diagnosis not present

## 2015-12-12 DIAGNOSIS — I10 Essential (primary) hypertension: Secondary | ICD-10-CM | POA: Diagnosis not present

## 2015-12-12 DIAGNOSIS — K219 Gastro-esophageal reflux disease without esophagitis: Secondary | ICD-10-CM | POA: Diagnosis not present

## 2015-12-12 DIAGNOSIS — K589 Irritable bowel syndrome without diarrhea: Secondary | ICD-10-CM | POA: Diagnosis not present

## 2015-12-13 ENCOUNTER — Other Ambulatory Visit: Payer: Self-pay | Admitting: Internal Medicine

## 2015-12-13 DIAGNOSIS — R928 Other abnormal and inconclusive findings on diagnostic imaging of breast: Secondary | ICD-10-CM

## 2015-12-19 ENCOUNTER — Ambulatory Visit
Admission: RE | Admit: 2015-12-19 | Discharge: 2015-12-19 | Disposition: A | Discharge status: Home / Self Care | Payer: PPO | Source: Ambulatory Visit | Attending: Internal Medicine | Admitting: Internal Medicine

## 2015-12-19 DIAGNOSIS — R921 Mammographic calcification found on diagnostic imaging of breast: Secondary | ICD-10-CM | POA: Diagnosis not present

## 2015-12-19 DIAGNOSIS — R928 Other abnormal and inconclusive findings on diagnostic imaging of breast: Secondary | ICD-10-CM

## 2015-12-19 LAB — CYTOLOGY - PAP

## 2015-12-26 DIAGNOSIS — F064 Anxiety disorder due to known physiological condition: Secondary | ICD-10-CM | POA: Diagnosis not present

## 2015-12-26 DIAGNOSIS — M545 Low back pain: Secondary | ICD-10-CM | POA: Diagnosis not present

## 2015-12-26 DIAGNOSIS — M130 Polyarthritis, unspecified: Secondary | ICD-10-CM | POA: Diagnosis not present

## 2015-12-26 DIAGNOSIS — M797 Fibromyalgia: Secondary | ICD-10-CM | POA: Diagnosis not present

## 2016-01-26 DIAGNOSIS — M130 Polyarthritis, unspecified: Secondary | ICD-10-CM | POA: Diagnosis not present

## 2016-01-26 DIAGNOSIS — M545 Low back pain: Secondary | ICD-10-CM | POA: Diagnosis not present

## 2016-01-26 DIAGNOSIS — F064 Anxiety disorder due to known physiological condition: Secondary | ICD-10-CM | POA: Diagnosis not present

## 2016-01-26 DIAGNOSIS — M797 Fibromyalgia: Secondary | ICD-10-CM | POA: Diagnosis not present

## 2016-02-22 DIAGNOSIS — M545 Low back pain: Secondary | ICD-10-CM | POA: Diagnosis not present

## 2016-02-22 DIAGNOSIS — M797 Fibromyalgia: Secondary | ICD-10-CM | POA: Diagnosis not present

## 2016-02-22 DIAGNOSIS — M130 Polyarthritis, unspecified: Secondary | ICD-10-CM | POA: Diagnosis not present

## 2016-02-22 DIAGNOSIS — F064 Anxiety disorder due to known physiological condition: Secondary | ICD-10-CM | POA: Diagnosis not present

## 2016-03-18 DIAGNOSIS — M797 Fibromyalgia: Secondary | ICD-10-CM | POA: Diagnosis not present

## 2016-03-18 DIAGNOSIS — M545 Low back pain: Secondary | ICD-10-CM | POA: Diagnosis not present

## 2016-03-18 DIAGNOSIS — M130 Polyarthritis, unspecified: Secondary | ICD-10-CM | POA: Diagnosis not present

## 2016-03-18 DIAGNOSIS — F064 Anxiety disorder due to known physiological condition: Secondary | ICD-10-CM | POA: Diagnosis not present

## 2016-04-19 DIAGNOSIS — M545 Low back pain: Secondary | ICD-10-CM | POA: Diagnosis not present

## 2016-04-19 DIAGNOSIS — M130 Polyarthritis, unspecified: Secondary | ICD-10-CM | POA: Diagnosis not present

## 2016-04-19 DIAGNOSIS — F064 Anxiety disorder due to known physiological condition: Secondary | ICD-10-CM | POA: Diagnosis not present

## 2016-04-19 DIAGNOSIS — M797 Fibromyalgia: Secondary | ICD-10-CM | POA: Diagnosis not present

## 2016-04-25 DIAGNOSIS — M25571 Pain in right ankle and joints of right foot: Secondary | ICD-10-CM | POA: Diagnosis not present

## 2016-05-20 ENCOUNTER — Encounter (HOSPITAL_BASED_OUTPATIENT_CLINIC_OR_DEPARTMENT_OTHER): Payer: Self-pay | Admitting: *Deleted

## 2016-05-20 DIAGNOSIS — F064 Anxiety disorder due to known physiological condition: Secondary | ICD-10-CM | POA: Diagnosis not present

## 2016-05-20 DIAGNOSIS — M797 Fibromyalgia: Secondary | ICD-10-CM | POA: Diagnosis not present

## 2016-05-20 DIAGNOSIS — M130 Polyarthritis, unspecified: Secondary | ICD-10-CM | POA: Diagnosis not present

## 2016-05-20 DIAGNOSIS — M545 Low back pain: Secondary | ICD-10-CM | POA: Diagnosis not present

## 2016-05-20 NOTE — Progress Notes (Signed)
   05/20/16 1604  OBSTRUCTIVE SLEEP APNEA  Have you ever been diagnosed with sleep apnea through a sleep study? No  Do you snore loudly (loud enough to be heard through closed doors)?  1  Do you often feel tired, fatigued, or sleepy during the daytime (such as falling asleep during driving or talking to someone)? 0  Has anyone observed you stop breathing during your sleep? 0  Do you have, or are you being treated for high blood pressure? 1  BMI more than 35 kg/m2? 1  Age > 50 (1-yes) 1  Female Gender (Yes=1) 0  Obstructive Sleep Apnea Score 4

## 2016-05-27 ENCOUNTER — Encounter (HOSPITAL_BASED_OUTPATIENT_CLINIC_OR_DEPARTMENT_OTHER): Payer: Self-pay | Admitting: Physician Assistant

## 2016-05-27 ENCOUNTER — Encounter (HOSPITAL_BASED_OUTPATIENT_CLINIC_OR_DEPARTMENT_OTHER)
Admission: RE | Admit: 2016-05-27 | Discharge: 2016-05-27 | Disposition: A | Discharge status: Home / Self Care | Payer: PPO | Source: Ambulatory Visit | Attending: Orthopedic Surgery | Admitting: Orthopedic Surgery

## 2016-05-27 DIAGNOSIS — Z7982 Long term (current) use of aspirin: Secondary | ICD-10-CM | POA: Diagnosis not present

## 2016-05-27 DIAGNOSIS — Z96653 Presence of artificial knee joint, bilateral: Secondary | ICD-10-CM | POA: Diagnosis not present

## 2016-05-27 DIAGNOSIS — Z79899 Other long term (current) drug therapy: Secondary | ICD-10-CM | POA: Diagnosis not present

## 2016-05-27 DIAGNOSIS — K219 Gastro-esophageal reflux disease without esophagitis: Secondary | ICD-10-CM | POA: Diagnosis not present

## 2016-05-27 DIAGNOSIS — J45909 Unspecified asthma, uncomplicated: Secondary | ICD-10-CM | POA: Diagnosis not present

## 2016-05-27 DIAGNOSIS — T8484XA Pain due to internal orthopedic prosthetic devices, implants and grafts, initial encounter: Secondary | ICD-10-CM | POA: Diagnosis not present

## 2016-05-27 DIAGNOSIS — Y831 Surgical operation with implant of artificial internal device as the cause of abnormal reaction of the patient, or of later complication, without mention of misadventure at the time of the procedure: Secondary | ICD-10-CM | POA: Diagnosis not present

## 2016-05-27 DIAGNOSIS — Z6841 Body Mass Index (BMI) 40.0 and over, adult: Secondary | ICD-10-CM | POA: Diagnosis not present

## 2016-05-27 DIAGNOSIS — I10 Essential (primary) hypertension: Secondary | ICD-10-CM | POA: Diagnosis not present

## 2016-05-27 DIAGNOSIS — M797 Fibromyalgia: Secondary | ICD-10-CM | POA: Diagnosis not present

## 2016-05-27 DIAGNOSIS — Z87891 Personal history of nicotine dependence: Secondary | ICD-10-CM | POA: Diagnosis not present

## 2016-05-27 HISTORY — DX: Pain due to internal orthopedic prosthetic devices, implants and grafts, initial encounter: T84.84XA

## 2016-05-27 LAB — BASIC METABOLIC PANEL
Anion gap: 8 (ref 5–15)
BUN: 19 mg/dL (ref 6–20)
CO2: 24 mmol/L (ref 22–32)
Calcium: 9.5 mg/dL (ref 8.9–10.3)
Chloride: 108 mmol/L (ref 101–111)
Creatinine, Ser: 0.72 mg/dL (ref 0.44–1.00)
GFR calc Af Amer: >60 mL/min (ref >60)
GFR calc non Af Amer: >60 mL/min (ref >60)
Glucose, Bld: 113 mg/dL — ABNORMAL HIGH (ref 65–99)
Potassium: 3.7 mmol/L (ref 3.5–5.1)
Sodium: 140 mmol/L (ref 135–145)

## 2016-05-27 NOTE — H&P (Signed)
Jill FarmerDeanna I [name] is an 55 y.o. female.   Chief Complaint: right ankle painful retained hardware HPI: This is a 55 year old female who is status post an ORIF of her right ankle for fracture.  The fracture has healed and the hardware is now painful because it is prominent.  Past Medical History:  Diagnosis Date  . Anemia   . Anemia   . Ankle syndesmosis disruption   . Anxiety    takes Ativan and Valium, after mother passed  . Arthritis    bilateral knees s/p knee replacement bilaterally  . Asthma    but pt states not severe enough to even have an inhaler  . Bronchitis   . Bruising    pt states unexplained d/t fibromyalgia  . Chronic back pain    2012 tailbone surgery and 3 lower discs.   . Closed fracture of distal end of right fibula and tibia   . Depression    from Fibromyalgia diagnosis; not taking medicine. since 2001  . Dizziness    rarely  . Fibromyalgia    diagnosed 2001  . GERD (gastroesophageal reflux disease)    Prilosec occasionally  . Headache(784.0)    "sinus headaches"  . Hypertension    since 2013  . Hyperthyroidism    but pt doesn't take anything for it;thyroid nodules  . IBS (irritable bowel syndrome)   . Impaired memory    states from fibromyalgia  . Insomnia    takes Ambien  . Jones fracture    left foot fifth metatarsal  . Multiple allergies    including latex, pet dander, shellfish, pet dander  . Seasonal allergies   . Shortness of breath    with exertion;pt states that its related to her weight  . Sore gums    this is why pt is on Amoxil-only takes for dental work  . Tachycardia   . Varicose vein    protrudes above skin-per pt;vein popped and bruised;ultrasound done to make sure that there were no clots;noclots were found    Past Surgical History:  Procedure Laterality Date  . ANTERIOR LUMBAR FUSION  09/20/2011   Procedure: ANTERIOR LUMBAR FUSION 1 LEVEL;  Surgeon: Carmela HurtKyle L Cabbell;  Location: [facility];  Service: Neurosurgery;  Laterality:  N/A;  Lumbar five-Sacral One Anterior Lumbar Interbody Fusion /Dr. Early to Approach   . CERVICAL DISC SURGERY     WITH TITANIUM PLATE IN NECK---LEFT SIDE  . fibroidectomy    . FRACTURE SURGERY  05/08/2010   Jones fracture left foot fifth metatarsal  . IRRIGATION AND DEBRIDEMENT KNEE  04/09/2012   Procedure: IRRIGATION AND DEBRIDEMENT KNEE;  Surgeon: Thera FlakeW D Caffrey Jr., MD;  Location: [facility];  Service: Orthopedics;  Laterality: Right;  . JOINT REPLACEMENT  01-27-2012   left total knee  . KNEE SURGERY  2005   Left knee arthroscopy  . NASAL SEPTOPLASTY W/ TURBINOPLASTY  2007   due to recurrent sinusitis  . ORIF ANKLE FRACTURE Right 06/21/2015   Procedure: OPEN REDUCTION INTERNAL FIXATION RIGHT DISTAL FIBULA  FRACTURE AND OPEN REDUCTION INTERNAL FIXATION SYNDESMOSIS ;  Surgeon: Salvatore Marvelobert Wainer, MD;  Location: [facility];  Service: Orthopedics;  Laterality: Right;  . SPINE SURGERY  2004   Cervical plate, ACDF  . STERIOD INJECTION  01/27/2012   Procedure: STEROID INJECTION;  Surgeon: Nilda Simmerobert A Wainer, MD;  [facility];  Service: Orthopedics;  Laterality: Right;  . TONSILLECTOMY  2007  . TOTAL KNEE ARTHROPLASTY  01/27/2012  Procedure: TOTAL KNEE ARTHROPLASTY;  Surgeon: Nilda Simmerobert A Wainer, MD;  Bilateral  . TOTAL KNEE ARTHROPLASTY  04/06/2012   Procedure: TOTAL KNEE ARTHROPLASTY;  Surgeon: Nilda Simmerobert A Wainer, MD;  Location: [facility] OR;  Service: Orthopedics;  Laterality: Right;  . TUBAL LIGATION    . UTERINE FIBROID SURGERY     mid 63200s    Family History  Problem Relation Age of Onset  . Stroke Father     passed 2004 from pneumonia  . Alcohol abuse Father   . Pulmonary embolism Mother     after minor knee surgery leading to DVT  . Arthritis Sister   . Depression Sister   . Hypertension Sister   . Diabetes      grandmother  . Anesthesia problems Neg Hx   . Hypotension Neg Hx   . Malignant hyperthermia Neg Hx   . Pseudochol deficiency Neg Hx    Social History:  reports that she quit  smoking about 17 years ago. She has a 5.00 pack-year smoking history. She has never used smokeless tobacco. She reports that she does not drink alcohol or use drugs.  Allergies:  Allergies  Allergen Reactions  . Shellfish Allergy Anaphylaxis  . Celebrex [Celecoxib] Itching    Only allergic to generic brand  . Latex Other (See Comments)    Itching/rash Latex glove with powder  . Other Other (See Comments)    MSG-    No prescriptions prior to admission.    No results found for this or any previous visit (from the past 48 hour(s)). No results found.  Review of Systems  Constitutional: Negative.   HENT: Negative.   Eyes: Negative.   Respiratory: Negative.   Cardiovascular: Negative.   Gastrointestinal: Negative.   Genitourinary: Negative.   Musculoskeletal: Positive for joint pain.  Skin: Negative.   Neurological: Negative.   Endo/Heme/Allergies: Negative.     Height 5\' 2"  (1.575 m), weight 109.8 kg (242 lb), last menstrual period 02/18/2016. Physical Exam  Constitutional: She is oriented to person, place, and time. She appears well-developed and well-nourished.  HENT:  Head: Normocephalic and atraumatic.  Mouth/Throat: Oropharynx is clear and moist.  Eyes: EOM are normal. Pupils are equal, round, and reactive to light.  Neck: Neck supple.  Cardiovascular: Normal rate and regular rhythm.   Respiratory: Effort normal.  GI: Soft.  Genitourinary:  Genitourinary Comments: Not pertinent to current symptomatology therefore not examined.  Musculoskeletal:  Right ankle lateral pain and swelling with prominent hardware.  She is neurovascularly intact with 2+ DP pulse.     Neurological: She is alert and oriented to person, place, and time.  Skin: Skin is warm and dry.  Psychiatric: She has a normal mood and affect. Her behavior is normal.     Assessment Principal Problem:   Painful orthopaedic hardware right ankle Active Problems:   Lumbar degenerative disc disease    Fibromyalgia   GERD (gastroesophageal reflux disease)   Anxiety   Hypertension   Anemia   Plan Hardware removal right ankle.  The risks, benefits, and possible complications of the procedure were discussed in detail with the patient.  The patient is without question.  Pascal LuxSHEPPERSON,Cloyce Paterson J, PA-C 05/27/2016, 9:41 AM

## 2016-05-28 ENCOUNTER — Encounter (HOSPITAL_BASED_OUTPATIENT_CLINIC_OR_DEPARTMENT_OTHER): Payer: Self-pay | Admitting: Anesthesiology

## 2016-05-28 ENCOUNTER — Encounter (HOSPITAL_BASED_OUTPATIENT_CLINIC_OR_DEPARTMENT_OTHER)
Admission: RE | Disposition: A | Discharge status: Home / Self Care | Payer: Self-pay | Source: Ambulatory Visit | Attending: Orthopedic Surgery

## 2016-05-28 ENCOUNTER — Ambulatory Visit (HOSPITAL_BASED_OUTPATIENT_CLINIC_OR_DEPARTMENT_OTHER): Payer: PPO | Admitting: Anesthesiology

## 2016-05-28 ENCOUNTER — Ambulatory Visit (HOSPITAL_BASED_OUTPATIENT_CLINIC_OR_DEPARTMENT_OTHER)
Admission: RE | Admit: 2016-05-28 | Discharge: 2016-05-28 | Disposition: A | Discharge status: Home / Self Care | Payer: PPO | Source: Ambulatory Visit | Attending: Orthopedic Surgery | Admitting: Orthopedic Surgery

## 2016-05-28 DIAGNOSIS — Z96653 Presence of artificial knee joint, bilateral: Secondary | ICD-10-CM | POA: Insufficient documentation

## 2016-05-28 DIAGNOSIS — T8484XA Pain due to internal orthopedic prosthetic devices, implants and grafts, initial encounter: Secondary | ICD-10-CM | POA: Diagnosis present

## 2016-05-28 DIAGNOSIS — M5136 Other intervertebral disc degeneration, lumbar region: Secondary | ICD-10-CM | POA: Diagnosis present

## 2016-05-28 DIAGNOSIS — D649 Anemia, unspecified: Secondary | ICD-10-CM | POA: Diagnosis present

## 2016-05-28 DIAGNOSIS — Z7982 Long term (current) use of aspirin: Secondary | ICD-10-CM | POA: Insufficient documentation

## 2016-05-28 DIAGNOSIS — I10 Essential (primary) hypertension: Secondary | ICD-10-CM | POA: Diagnosis not present

## 2016-05-28 DIAGNOSIS — K219 Gastro-esophageal reflux disease without esophagitis: Secondary | ICD-10-CM | POA: Diagnosis not present

## 2016-05-28 DIAGNOSIS — Z79899 Other long term (current) drug therapy: Secondary | ICD-10-CM | POA: Diagnosis not present

## 2016-05-28 DIAGNOSIS — M797 Fibromyalgia: Secondary | ICD-10-CM | POA: Diagnosis present

## 2016-05-28 DIAGNOSIS — J449 Chronic obstructive pulmonary disease, unspecified: Secondary | ICD-10-CM | POA: Diagnosis not present

## 2016-05-28 DIAGNOSIS — T84498A Other mechanical complication of other internal orthopedic devices, implants and grafts, initial encounter: Secondary | ICD-10-CM | POA: Diagnosis not present

## 2016-05-28 DIAGNOSIS — M51369 Other intervertebral disc degeneration, lumbar region without mention of lumbar back pain or lower extremity pain: Secondary | ICD-10-CM | POA: Diagnosis present

## 2016-05-28 DIAGNOSIS — Z472 Encounter for removal of internal fixation device: Secondary | ICD-10-CM | POA: Diagnosis not present

## 2016-05-28 DIAGNOSIS — Z87891 Personal history of nicotine dependence: Secondary | ICD-10-CM | POA: Diagnosis not present

## 2016-05-28 DIAGNOSIS — J45909 Unspecified asthma, uncomplicated: Secondary | ICD-10-CM | POA: Insufficient documentation

## 2016-05-28 DIAGNOSIS — Z6841 Body Mass Index (BMI) 40.0 and over, adult: Secondary | ICD-10-CM | POA: Diagnosis not present

## 2016-05-28 DIAGNOSIS — Y831 Surgical operation with implant of artificial internal device as the cause of abnormal reaction of the patient, or of later complication, without mention of misadventure at the time of the procedure: Secondary | ICD-10-CM | POA: Insufficient documentation

## 2016-05-28 DIAGNOSIS — F419 Anxiety disorder, unspecified: Secondary | ICD-10-CM | POA: Diagnosis present

## 2016-05-28 HISTORY — DX: Pain due to internal orthopedic prosthetic devices, implants and grafts, initial encounter: T84.84XA

## 2016-05-28 HISTORY — PX: HARDWARE REMOVAL: SHX979

## 2016-05-28 SURGERY — REMOVAL, HARDWARE
Anesthesia: General | Site: Foot | Laterality: Right

## 2016-05-28 MED ORDER — SCOPOLAMINE 1 MG/3DAYS TD PT72: 1.0000 | MEDICATED_PATCH | Freq: Once | TRANSDERMAL | Status: DC | PRN

## 2016-05-28 MED ORDER — CHLORHEXIDINE GLUCONATE 4 % EX LIQD: 60.0000 mL | Freq: Once | CUTANEOUS | Status: DC

## 2016-05-28 MED ORDER — CEFAZOLIN SODIUM-DEXTROSE 2-4 GM/100ML-% IV SOLN
2.0000 g | INTRAVENOUS | Status: AC
  Administered 2016-05-28: 2 g via INTRAVENOUS

## 2016-05-28 MED ORDER — LIDOCAINE 2% (20 MG/ML) 5 ML SYRINGE
INTRAMUSCULAR | Status: AC
  Filled 2016-05-28: qty 5

## 2016-05-28 MED ORDER — FENTANYL CITRATE (PF) 100 MCG/2ML IJ SOLN: 25.0000 ug | INTRAMUSCULAR | Status: DC | PRN

## 2016-05-28 MED ORDER — ONDANSETRON HCL 4 MG/2ML IJ SOLN
INTRAMUSCULAR | Status: AC
  Filled 2016-05-28: qty 2

## 2016-05-28 MED ORDER — LACTATED RINGERS IV SOLN
INTRAVENOUS | Status: DC
Start: 2016-05-28 — End: 2016-05-28
  Administered 2016-05-28: 12:00:00 via INTRAVENOUS

## 2016-05-28 MED ORDER — ONDANSETRON HCL 4 MG/2ML IJ SOLN
INTRAMUSCULAR | Status: DC | PRN
  Administered 2016-05-28: 4 mg via INTRAVENOUS

## 2016-05-28 MED ORDER — CEFAZOLIN SODIUM-DEXTROSE 2-4 GM/100ML-% IV SOLN
INTRAVENOUS | Status: AC
  Filled 2016-05-28: qty 100

## 2016-05-28 MED ORDER — FENTANYL CITRATE (PF) 100 MCG/2ML IJ SOLN
INTRAMUSCULAR | Status: AC
  Filled 2016-05-28: qty 2

## 2016-05-28 MED ORDER — GLYCOPYRROLATE 0.2 MG/ML IJ SOLN: 0.2000 mg | Freq: Once | INTRAMUSCULAR | Status: DC | PRN

## 2016-05-28 MED ORDER — FENTANYL CITRATE (PF) 100 MCG/2ML IJ SOLN
50.0000 ug | INTRAMUSCULAR | Status: DC | PRN
  Administered 2016-05-28: 100 ug via INTRAVENOUS

## 2016-05-28 MED ORDER — DEXAMETHASONE SODIUM PHOSPHATE 10 MG/ML IJ SOLN
INTRAMUSCULAR | Status: AC
  Filled 2016-05-28: qty 1

## 2016-05-28 MED ORDER — MIDAZOLAM HCL 2 MG/2ML IJ SOLN
INTRAMUSCULAR | Status: AC
  Filled 2016-05-28: qty 2

## 2016-05-28 MED ORDER — CEFAZOLIN IN D5W 1 GM/50ML IV SOLN
INTRAVENOUS | Status: AC
  Filled 2016-05-28: qty 50

## 2016-05-28 MED ORDER — SODIUM CHLORIDE 0.9 % IN NEBU
INHALATION_SOLUTION | RESPIRATORY_TRACT | Status: AC
  Filled 2016-05-28: qty 3

## 2016-05-28 MED ORDER — MIDAZOLAM HCL 2 MG/2ML IJ SOLN
1.0000 mg | INTRAMUSCULAR | Status: DC | PRN
  Administered 2016-05-28: 2 mg via INTRAVENOUS

## 2016-05-28 MED ORDER — PROPOFOL 10 MG/ML IV BOLUS
INTRAVENOUS | Status: AC
  Filled 2016-05-28: qty 20

## 2016-05-28 MED ORDER — LACTATED RINGERS IV SOLN: INTRAVENOUS | Status: DC

## 2016-05-28 MED ORDER — PROMETHAZINE HCL 25 MG/ML IJ SOLN: 6.2500 mg | INTRAMUSCULAR | Status: DC | PRN

## 2016-05-28 MED ORDER — BUPIVACAINE-EPINEPHRINE (PF) 0.25% -1:200000 IJ SOLN
INTRAMUSCULAR | Status: AC
  Filled 2016-05-28: qty 30

## 2016-05-28 MED ORDER — DEXAMETHASONE SODIUM PHOSPHATE 4 MG/ML IJ SOLN
INTRAMUSCULAR | Status: DC | PRN
  Administered 2016-05-28: 10 mg via INTRAVENOUS

## 2016-05-28 MED ORDER — LIDOCAINE 2% (20 MG/ML) 5 ML SYRINGE
INTRAMUSCULAR | Status: DC | PRN
  Administered 2016-05-28: 100 mg via INTRAVENOUS

## 2016-05-28 MED ORDER — POVIDONE-IODINE 7.5 % EX SOLN: Freq: Once | CUTANEOUS | Status: DC

## 2016-05-28 MED ORDER — BUPIVACAINE-EPINEPHRINE 0.25% -1:200000 IJ SOLN
INTRAMUSCULAR | Status: DC | PRN
Start: 2016-05-28 — End: 2016-05-28
  Administered 2016-05-28: 10 mL
  Administered 2016-05-28: 50 mL

## 2016-05-28 MED ORDER — PROPOFOL 10 MG/ML IV BOLUS
INTRAVENOUS | Status: DC | PRN
  Administered 2016-05-28: 200 mg via INTRAVENOUS

## 2016-05-28 SURGICAL SUPPLY — 63 items
APPLICATOR CHLORAPREP 3ML ORNG (MISCELLANEOUS) ×2 IMPLANT
BAG DECANTER FOR FLEXI CONT (MISCELLANEOUS) IMPLANT
BANDAGE ACE 4X5 VEL STRL LF (GAUZE/BANDAGES/DRESSINGS) ×2 IMPLANT
BANDAGE ACE 6X5 VEL STRL LF (GAUZE/BANDAGES/DRESSINGS) ×3 IMPLANT
BLADE SURG 15 STRL LF DISP TIS (BLADE) ×1 IMPLANT
BLADE SURG 15 STRL SS (BLADE) ×3
BNDG CMPR 9X4 STRL LF SNTH (GAUZE/BANDAGES/DRESSINGS)
BNDG COHESIVE 4X5 TAN STRL (GAUZE/BANDAGES/DRESSINGS) ×3 IMPLANT
BNDG ESMARK 4X9 LF (GAUZE/BANDAGES/DRESSINGS) ×1 IMPLANT
CANISTER SUCT 1200ML W/VALVE (MISCELLANEOUS) IMPLANT
CLOSURE WOUND 1/2 X4 (GAUZE/BANDAGES/DRESSINGS)
COVER BACK TABLE 60X90IN (DRAPES) ×3 IMPLANT
CUFF TOURNIQUET SINGLE 18IN (TOURNIQUET CUFF) IMPLANT
CUFF TOURNIQUET SINGLE 34IN LL (TOURNIQUET CUFF) IMPLANT
DRAPE EXTREMITY T 121X128X90 (DRAPE) ×3 IMPLANT
DRAPE IMP U-DRAPE 54X76 (DRAPES) ×3 IMPLANT
DRAPE OEC MINIVIEW 54X84 (DRAPES) ×2 IMPLANT
DRAPE U-SHAPE 47X51 STRL (DRAPES) ×3 IMPLANT
DRSG PAD ABDOMINAL 8X10 ST (GAUZE/BANDAGES/DRESSINGS) IMPLANT
DURAPREP 26ML APPLICATOR (WOUND CARE) ×1 IMPLANT
ELECT REM PT RETURN 9FT ADLT (ELECTROSURGICAL) ×3
ELECTRODE REM PT RTRN 9FT ADLT (ELECTROSURGICAL) ×1 IMPLANT
GAUZE SPONGE 4X4 12PLY STRL (GAUZE/BANDAGES/DRESSINGS) ×3 IMPLANT
GAUZE SPONGE 4X4 16PLY XRAY LF (GAUZE/BANDAGES/DRESSINGS) IMPLANT
GAUZE XEROFORM 1X8 LF (GAUZE/BANDAGES/DRESSINGS) ×2 IMPLANT
GLOVE BIO SURGEON STRL SZ7 (GLOVE) ×3 IMPLANT
GLOVE BIOGEL PI IND STRL 7.0 (GLOVE) ×1 IMPLANT
GLOVE BIOGEL PI IND STRL 7.5 (GLOVE) ×1 IMPLANT
GLOVE BIOGEL PI IND STRL 8 (GLOVE) IMPLANT
GLOVE BIOGEL PI INDICATOR 7.0 (GLOVE) ×4
GLOVE BIOGEL PI INDICATOR 7.5 (GLOVE)
GLOVE BIOGEL PI INDICATOR 8 (GLOVE) ×2
GLOVE SS BIOGEL STRL SZ 7.5 (GLOVE) ×1 IMPLANT
GLOVE SUPERSENSE BIOGEL SZ 7.5 (GLOVE)
GLOVE SURG SS PI 7.0 STRL IVOR (GLOVE) ×6 IMPLANT
GLOVE SURG SS PI 7.5 STRL IVOR (GLOVE) ×4 IMPLANT
GOWN STRL REUS W/ TWL LRG LVL3 (GOWN DISPOSABLE) ×3 IMPLANT
GOWN STRL REUS W/TWL LRG LVL3 (GOWN DISPOSABLE) ×9
NEEDLE HYPO 22GX1.5 SAFETY (NEEDLE) ×2 IMPLANT
PACK BASIN DAY SURGERY FS (CUSTOM PROCEDURE TRAY) ×3 IMPLANT
PAD CAST 3X4 CTTN HI CHSV (CAST SUPPLIES) IMPLANT
PAD CAST 4YDX4 CTTN HI CHSV (CAST SUPPLIES) ×1 IMPLANT
PADDING CAST ABS 4INX4YD NS (CAST SUPPLIES)
PADDING CAST ABS COTTON 4X4 ST (CAST SUPPLIES) ×1 IMPLANT
PADDING CAST COTTON 3X4 STRL (CAST SUPPLIES) ×3
PADDING CAST COTTON 4X4 STRL (CAST SUPPLIES) ×3
PENCIL BUTTON HOLSTER BLD 10FT (ELECTRODE) ×2 IMPLANT
SPONGE LAP 4X18 X RAY DECT (DISPOSABLE) ×2 IMPLANT
STOCKINETTE 6  STRL (DRAPES) ×2
STOCKINETTE 6 STRL (DRAPES) ×1 IMPLANT
STRIP CLOSURE SKIN 1/2X4 (GAUZE/BANDAGES/DRESSINGS) IMPLANT
SUCTION FRAZIER HANDLE 10FR (MISCELLANEOUS)
SUCTION TUBE FRAZIER 10FR DISP (MISCELLANEOUS) IMPLANT
SUT ETHILON 4 0 PS 2 18 (SUTURE) ×2 IMPLANT
SUT PROLENE 3 0 PS 2 (SUTURE) IMPLANT
SUT VIC AB 2-0 SH 27 (SUTURE)
SUT VIC AB 2-0 SH 27XBRD (SUTURE) IMPLANT
SUT VIC AB 3-0 FS2 27 (SUTURE) IMPLANT
SUT VICRYL 4-0 PS2 18IN ABS (SUTURE) ×2 IMPLANT
SYR BULB 3OZ (MISCELLANEOUS) ×1 IMPLANT
SYR CONTROL 10ML LL (SYRINGE) ×2 IMPLANT
TUBE CONNECTING 20'X1/4 (TUBING)
TUBE CONNECTING 20X1/4 (TUBING) IMPLANT

## 2016-05-28 NOTE — Anesthesia Postprocedure Evaluation (Signed)
Anesthesia Post Note  Patient: [name]  Procedure(s) Performed: Procedure(s) (LRB): RIGHT ANKLE HARDWARE REMOVAL (Right)  Patient location during evaluation: PACU Anesthesia Type: General Level of consciousness: awake and alert Pain management: pain level controlled Vital Signs Assessment: post-procedure vital signs reviewed and stable Respiratory status: spontaneous breathing, nonlabored ventilation, respiratory function stable and patient connected to nasal cannula oxygen Cardiovascular status: blood pressure returned to baseline and stable Postop Assessment: no signs of nausea or vomiting Anesthetic complications: no    Last Vitals:  Vitals:   05/28/16 1300 05/28/16 1315  BP: (!) 137/94 135/88  Pulse: 73 68  Resp: 16 (!) 24  Temp: 36.6 C     Last Pain:  Vitals:   05/28/16 1300  TempSrc:   PainSc: 0-No pain                 Shelton SilvasKevin D Jonthan Leite

## 2016-05-28 NOTE — Anesthesia Preprocedure Evaluation (Addendum)
Anesthesia Evaluation  Patient identified by MRN, date of birth, ID band Patient awake    Reviewed: Allergy & Precautions, NPO status , Patient's Chart, lab work & pertinent test results, reviewed documented beta blocker date and time   History of Anesthesia Complications Negative for: history of anesthetic complications  Airway Mallampati: II  TM Distance: >3 FB Neck ROM: Full    Dental  (+) Dental Advisory Given, Teeth Intact   Pulmonary asthma (used inhaler this morning) , neg sleep apnea, neg COPD, neg recent URI, former smoker,    Pulmonary exam normal breath sounds clear to auscultation       Cardiovascular hypertension, Pt. on home beta blockers and Pt. on medications (-) angina(-) Past MI, (-) Cardiac Stents, (-) CABG and (-) Orthopnea  Rhythm:Regular Rate:Normal     Neuro/Psych  Headaches, neg Seizures PSYCHIATRIC DISORDERS Anxiety Depression    GI/Hepatic Neg liver ROS, GERD  Medicated and Controlled,IBS   Endo/Other  neg diabetesHyperthyroidism Morbid obesity  Renal/GU negative Renal ROS     Musculoskeletal  (+) Arthritis , Fibromyalgia -Chronic back pain   Abdominal (+) + obese,   Peds  Hematology  (+) Blood dyscrasia, anemia ,   Anesthesia Other Findings Patient endorses nerve pain in BLE that she attributes to previous epidural placement.  Reproductive/Obstetrics                           Anesthesia Physical Anesthesia Plan  ASA: III  Anesthesia Plan: General   Post-op Pain Management:    Induction: Intravenous  Airway Management Planned: LMA  Additional Equipment:   Intra-op Plan:   Post-operative Plan: Extubation in OR  Informed Consent: I have reviewed the patients History and Physical, chart, labs and discussed the procedure including the risks, benefits and alternatives for the proposed anesthesia with the patient or authorized representative who has indicated  his/her understanding and acceptance.   Dental advisory given  Plan Discussed with:   Anesthesia Plan Comments:        Anesthesia Quick Evaluation

## 2016-05-28 NOTE — Anesthesia Procedure Notes (Signed)
Procedure Name: LMA Insertion Date/Time: 05/28/2016 12:30 PM Performed by: Caren MacadamARTER, Ilyanna Baillargeon W Pre-anesthesia Checklist: Patient identified, Emergency Drugs available, Suction available and Patient being monitored Patient Re-evaluated:Patient Re-evaluated prior to inductionOxygen Delivery Method: Circle system utilized Preoxygenation: Pre-oxygenation with 100% oxygen Intubation Type: IV induction Ventilation: Mask ventilation without difficulty LMA: LMA inserted LMA Size: 4.0 Number of attempts: 1 Airway Equipment and Method: Bite block Placement Confirmation: positive ETCO2 Tube secured with: Tape Dental Injury: Teeth and Oropharynx as per pre-operative assessment

## 2016-05-28 NOTE — Transfer of Care (Signed)
Immediate Anesthesia Transfer of Care Note  Patient: [name]  Procedure(s) Performed: Procedure(s): RIGHT ANKLE HARDWARE REMOVAL (Right)  Patient Location: PACU  Anesthesia Type:General  Level of Consciousness: awake and alert   Airway & Oxygen Therapy: Patient Spontanous Breathing and Patient connected to face mask oxygen  Post-op Assessment: Report given to RN and Post -op Vital signs reviewed and stable  Post vital signs: Reviewed and stable  Last Vitals:  Vitals:   05/28/16 1051  BP: 125/73  Pulse: 67  Resp: 20  Temp: 37 C    Last Pain:  Vitals:   05/28/16 1051  TempSrc: Oral  PainSc: 7       Patients Stated Pain Goal: 3 (05/20/16 1605)  Complications: No apparent anesthesia complications

## 2016-05-28 NOTE — Interval H&P Note (Signed)
History and Physical Interval Note:  05/28/2016 12:19 PM  [name]  has presented today for surgery, with the diagnosis of REMOVAL RETAINED HARDWARE  The various methods of treatment have been discussed with the patient and family. After consideration of risks, benefits and other options for treatment, the patient has consented to  Procedure(s): RIGHT ANKLE HARDWARE REMOVAL (Right) as a surgical intervention .  The patient's history has been reviewed, patient examined, no change in status, stable for surgery.  I have reviewed the patient's chart and labs.  Questions were answered to the patient's satisfaction.     Salvatore MarvelWAINER,Tresha Muzio A

## 2016-05-28 NOTE — Discharge Instructions (Signed)
Weight bear as tolerated in 24 hours, may use crutches. Post op shoe if desired.  Follow up in office- call for appt in one week. Remove Ace wrap dressing on 05/29/16.   Ok to shower- do not submerge. Resume medications and regular diet. Use pain med at home as directed.    Post Anesthesia Home Care Instructions  Activity: Get plenty of rest for the remainder of the day. A responsible adult should stay with you for 24 hours following the procedure.  For the next 24 hours, DO NOT: -Drive a car -Advertising copywriterperate machinery -Drink alcoholic beverages -Take any medication unless instructed by your physician -Make any legal decisions or sign important papers.  Meals: Start with liquid foods such as gelatin or soup. Progress to regular foods as tolerated. Avoid greasy, spicy, heavy foods. If nausea and/or vomiting occur, drink only clear liquids until the nausea and/or vomiting subsides. Call your physician if vomiting continues.  Special Instructions/Symptoms: Your throat may feel dry or sore from the anesthesia or the breathing tube placed in your throat during surgery. If this causes discomfort, gargle with warm salt water. The discomfort should disappear within 24 hours.  If you had a scopolamine patch placed behind your ear for the management of post- operative nausea and/or vomiting:  1. The medication in the patch is effective for 72 hours, after which it should be removed.  Wrap patch in a tissue and discard in the trash. Wash hands thoroughly with soap and water. 2. You may remove the patch earlier than 72 hours if you experience unpleasant side effects which may include dry mouth, dizziness or visual disturbances. 3. Avoid touching the patch. Wash your hands with soap and water after contact with the patch.

## 2016-05-29 NOTE — Op Note (Signed)
[name]:  Arentz, Denice               ACCOUNT NO.:  [id number]  MEDICAL RECORD NO.:  [id number]  LOCATION:                                 FACILITY:  PHYSICIAN:  Augusten Lipkin A. Thurston HoleWainer, M.D. DATE OF BIRTH:  August 19, 1961  DATE OF PROCEDURE:  05/28/2016 DATE OF DISCHARGE:                              OPERATIVE REPORT   PREOPERATIVE DIAGNOSIS:  Retained hardware - painful - right ankle.  POSTOPERATIVE DIAGNOSIS:  Retained hardware - painful - right ankle.  PROCEDURE:  Removal of retained hardware, right ankle.  SURGEON:  Elana Almobert A. Thurston HoleWainer, M.D.  ANESTHESIA:  General.  OPERATIVE TIME:  15 minutes.  COMPLICATIONS:  None.  INDICATION FOR PROCEDURE:  [name] is a 55 year old woman who had sustained a right ankle fracture with ORIF of her right ankle approximately 1 year ago.  She now has one screw that had come loose from the hardware in her ankle and this will be removed because it is painful.  The rest of the hardware in her ankle is not loose and not painful.  DESCRIPTION OF PROCEDURE:  [name] was brought to the operating room on May 28, 2016, placed on the operative table in supine position. After being placed under general anesthesia, her right foot and leg were prepped using sterile ChloraPrep and draped using sterile technique. Intraoperative fluoroscopy confirmed the position of the one loose screw and then a 1-cm incision was made along her previous incision. Underlying subcutaneous tissues were incised along with skin incision. The screw was exposed and then screwdriver used to remove the screw and this was confirmed with intraoperative fluoroscopy.  The rest of the hardware was well-seated and was not removed.  The wound was then irrigated and closed with 3-0 Vicryl and 4-0 nylon and injected with 0.25% Marcaine with epinephrine.  Sterile dressings were applied.  The patient awakened, taken to the recovery room in stable condition.  FOLLOWUP CARE:  [name] will be  followed as an outpatient on oxycodone.  Seen back in the office in a week for wound check and followup.     Ailis Rigaud A. Thurston HoleWainer, M.D.   ______________________________ Elana Almobert A. Thurston HoleWainer, M.D.    RAW/MEDQ  D:  05/28/2016  T:  05/28/2016  Job:  [id number]

## 2016-05-30 ENCOUNTER — Encounter (HOSPITAL_BASED_OUTPATIENT_CLINIC_OR_DEPARTMENT_OTHER): Payer: Self-pay | Admitting: Orthopedic Surgery

## 2016-06-04 ENCOUNTER — Encounter (HOSPITAL_BASED_OUTPATIENT_CLINIC_OR_DEPARTMENT_OTHER): Payer: Self-pay | Admitting: Orthopedic Surgery

## 2016-06-04 DIAGNOSIS — T84498D Other mechanical complication of other internal orthopedic devices, implants and grafts, subsequent encounter: Secondary | ICD-10-CM | POA: Diagnosis not present

## 2016-06-12 DIAGNOSIS — T84498D Other mechanical complication of other internal orthopedic devices, implants and grafts, subsequent encounter: Secondary | ICD-10-CM | POA: Diagnosis not present

## 2016-06-17 DIAGNOSIS — M130 Polyarthritis, unspecified: Secondary | ICD-10-CM | POA: Diagnosis not present

## 2016-06-17 DIAGNOSIS — M797 Fibromyalgia: Secondary | ICD-10-CM | POA: Diagnosis not present

## 2016-06-17 DIAGNOSIS — M545 Low back pain: Secondary | ICD-10-CM | POA: Diagnosis not present

## 2016-06-17 DIAGNOSIS — F064 Anxiety disorder due to known physiological condition: Secondary | ICD-10-CM | POA: Diagnosis not present

## 2016-06-18 ENCOUNTER — Other Ambulatory Visit: Payer: Self-pay | Admitting: Family Medicine

## 2016-06-18 ENCOUNTER — Ambulatory Visit
Admission: RE | Admit: 2016-06-18 | Discharge: 2016-06-18 | Disposition: A | Discharge status: Home / Self Care | Payer: PPO | Source: Ambulatory Visit | Attending: Family Medicine | Admitting: Family Medicine

## 2016-06-18 DIAGNOSIS — M47812 Spondylosis without myelopathy or radiculopathy, cervical region: Secondary | ICD-10-CM | POA: Diagnosis not present

## 2016-06-18 DIAGNOSIS — Z6841 Body Mass Index (BMI) 40.0 and over, adult: Secondary | ICD-10-CM | POA: Diagnosis not present

## 2016-06-18 DIAGNOSIS — M542 Cervicalgia: Secondary | ICD-10-CM

## 2016-06-18 DIAGNOSIS — M797 Fibromyalgia: Secondary | ICD-10-CM | POA: Diagnosis not present

## 2016-06-18 DIAGNOSIS — I10 Essential (primary) hypertension: Secondary | ICD-10-CM | POA: Diagnosis not present

## 2016-06-18 DIAGNOSIS — K219 Gastro-esophageal reflux disease without esophagitis: Secondary | ICD-10-CM | POA: Diagnosis not present

## 2016-06-18 DIAGNOSIS — J45909 Unspecified asthma, uncomplicated: Secondary | ICD-10-CM | POA: Diagnosis not present

## 2016-06-18 DIAGNOSIS — K589 Irritable bowel syndrome without diarrhea: Secondary | ICD-10-CM | POA: Diagnosis not present

## 2016-07-09 ENCOUNTER — Other Ambulatory Visit: Payer: Self-pay | Admitting: Internal Medicine

## 2016-07-09 DIAGNOSIS — R921 Mammographic calcification found on diagnostic imaging of breast: Secondary | ICD-10-CM

## 2016-07-16 ENCOUNTER — Ambulatory Visit
Admission: RE | Admit: 2016-07-16 | Discharge: 2016-07-16 | Disposition: A | Discharge status: Home / Self Care | Payer: PPO | Source: Ambulatory Visit | Attending: Internal Medicine | Admitting: Internal Medicine

## 2016-07-16 DIAGNOSIS — R921 Mammographic calcification found on diagnostic imaging of breast: Secondary | ICD-10-CM | POA: Diagnosis not present

## 2016-07-17 DIAGNOSIS — F064 Anxiety disorder due to known physiological condition: Secondary | ICD-10-CM | POA: Diagnosis not present

## 2016-07-17 DIAGNOSIS — M797 Fibromyalgia: Secondary | ICD-10-CM | POA: Diagnosis not present

## 2016-07-17 DIAGNOSIS — M545 Low back pain: Secondary | ICD-10-CM | POA: Diagnosis not present

## 2016-07-17 DIAGNOSIS — M130 Polyarthritis, unspecified: Secondary | ICD-10-CM | POA: Diagnosis not present

## 2016-08-15 DIAGNOSIS — M130 Polyarthritis, unspecified: Secondary | ICD-10-CM | POA: Diagnosis not present

## 2016-08-15 DIAGNOSIS — M545 Low back pain: Secondary | ICD-10-CM | POA: Diagnosis not present

## 2016-08-15 DIAGNOSIS — F064 Anxiety disorder due to known physiological condition: Secondary | ICD-10-CM | POA: Diagnosis not present

## 2016-08-15 DIAGNOSIS — M797 Fibromyalgia: Secondary | ICD-10-CM | POA: Diagnosis not present

## 2016-09-13 DIAGNOSIS — F064 Anxiety disorder due to known physiological condition: Secondary | ICD-10-CM | POA: Diagnosis not present

## 2016-09-13 DIAGNOSIS — M130 Polyarthritis, unspecified: Secondary | ICD-10-CM | POA: Diagnosis not present

## 2016-09-13 DIAGNOSIS — M797 Fibromyalgia: Secondary | ICD-10-CM | POA: Diagnosis not present

## 2016-09-13 DIAGNOSIS — M545 Low back pain: Secondary | ICD-10-CM | POA: Diagnosis not present

## 2016-09-24 DIAGNOSIS — B349 Viral infection, unspecified: Secondary | ICD-10-CM | POA: Diagnosis not present

## 2016-09-24 DIAGNOSIS — E041 Nontoxic single thyroid nodule: Secondary | ICD-10-CM | POA: Diagnosis not present

## 2016-09-24 DIAGNOSIS — K219 Gastro-esophageal reflux disease without esophagitis: Secondary | ICD-10-CM | POA: Diagnosis not present

## 2016-12-12 DIAGNOSIS — Z79891 Long term (current) use of opiate analgesic: Secondary | ICD-10-CM | POA: Diagnosis not present

## 2016-12-19 DIAGNOSIS — M542 Cervicalgia: Secondary | ICD-10-CM | POA: Diagnosis not present

## 2016-12-19 DIAGNOSIS — K589 Irritable bowel syndrome without diarrhea: Secondary | ICD-10-CM | POA: Diagnosis not present

## 2016-12-19 DIAGNOSIS — J45909 Unspecified asthma, uncomplicated: Secondary | ICD-10-CM | POA: Diagnosis not present

## 2016-12-19 DIAGNOSIS — K219 Gastro-esophageal reflux disease without esophagitis: Secondary | ICD-10-CM | POA: Diagnosis not present

## 2016-12-19 DIAGNOSIS — M797 Fibromyalgia: Secondary | ICD-10-CM | POA: Diagnosis not present

## 2016-12-19 DIAGNOSIS — Z792 Long term (current) use of antibiotics: Secondary | ICD-10-CM | POA: Diagnosis not present

## 2016-12-19 DIAGNOSIS — R5383 Other fatigue: Secondary | ICD-10-CM | POA: Diagnosis not present

## 2016-12-19 DIAGNOSIS — J069 Acute upper respiratory infection, unspecified: Secondary | ICD-10-CM | POA: Diagnosis not present

## 2016-12-19 DIAGNOSIS — I10 Essential (primary) hypertension: Secondary | ICD-10-CM | POA: Diagnosis not present

## 2017-03-09 IMAGING — CR DG CERVICAL SPINE 2 OR 3 VIEWS
3 series · 3 of 3 positions shown · non-contrast
Comparison: MRI 07/22/2013

CLINICAL DATA: Posterior neck pain 1 month. No injury. Cervical
spine surgery 1333.

EXAM:
CERVICAL SPINE - 2-3 VIEW

[w c-spine lat *]
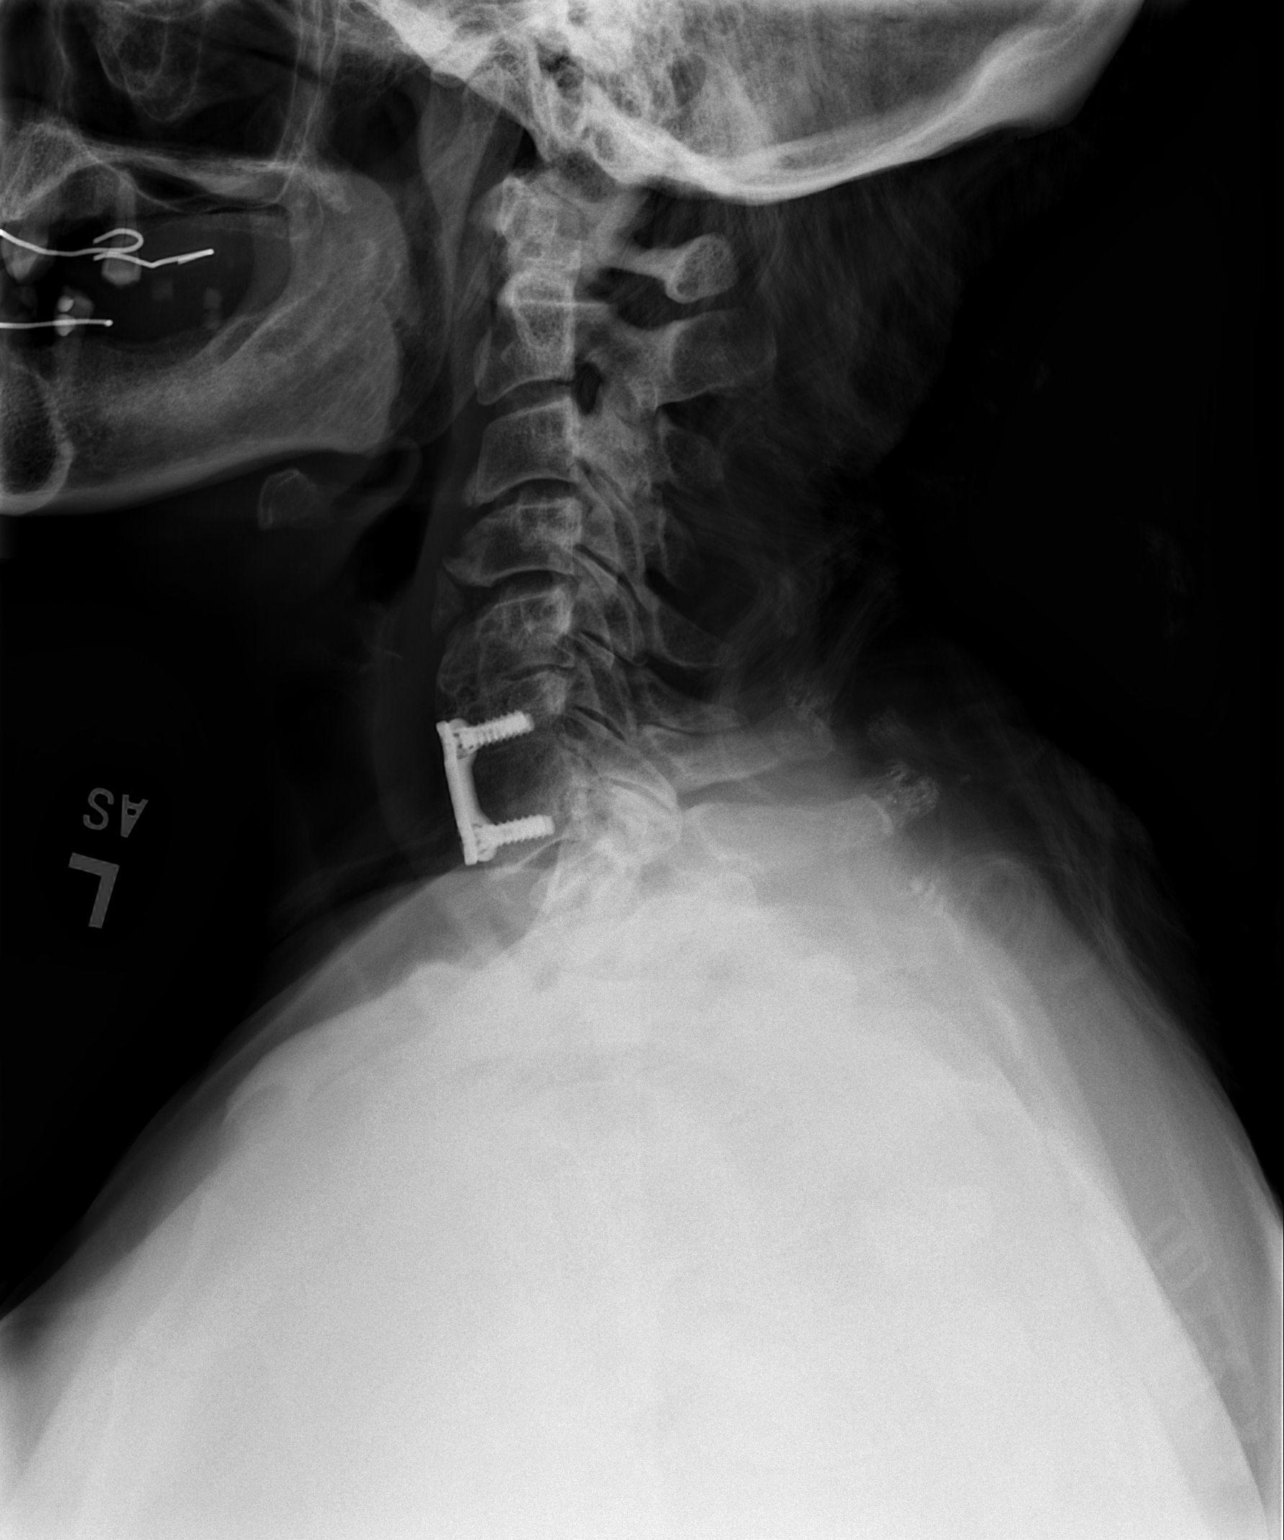

[w c-spine a.p. *]
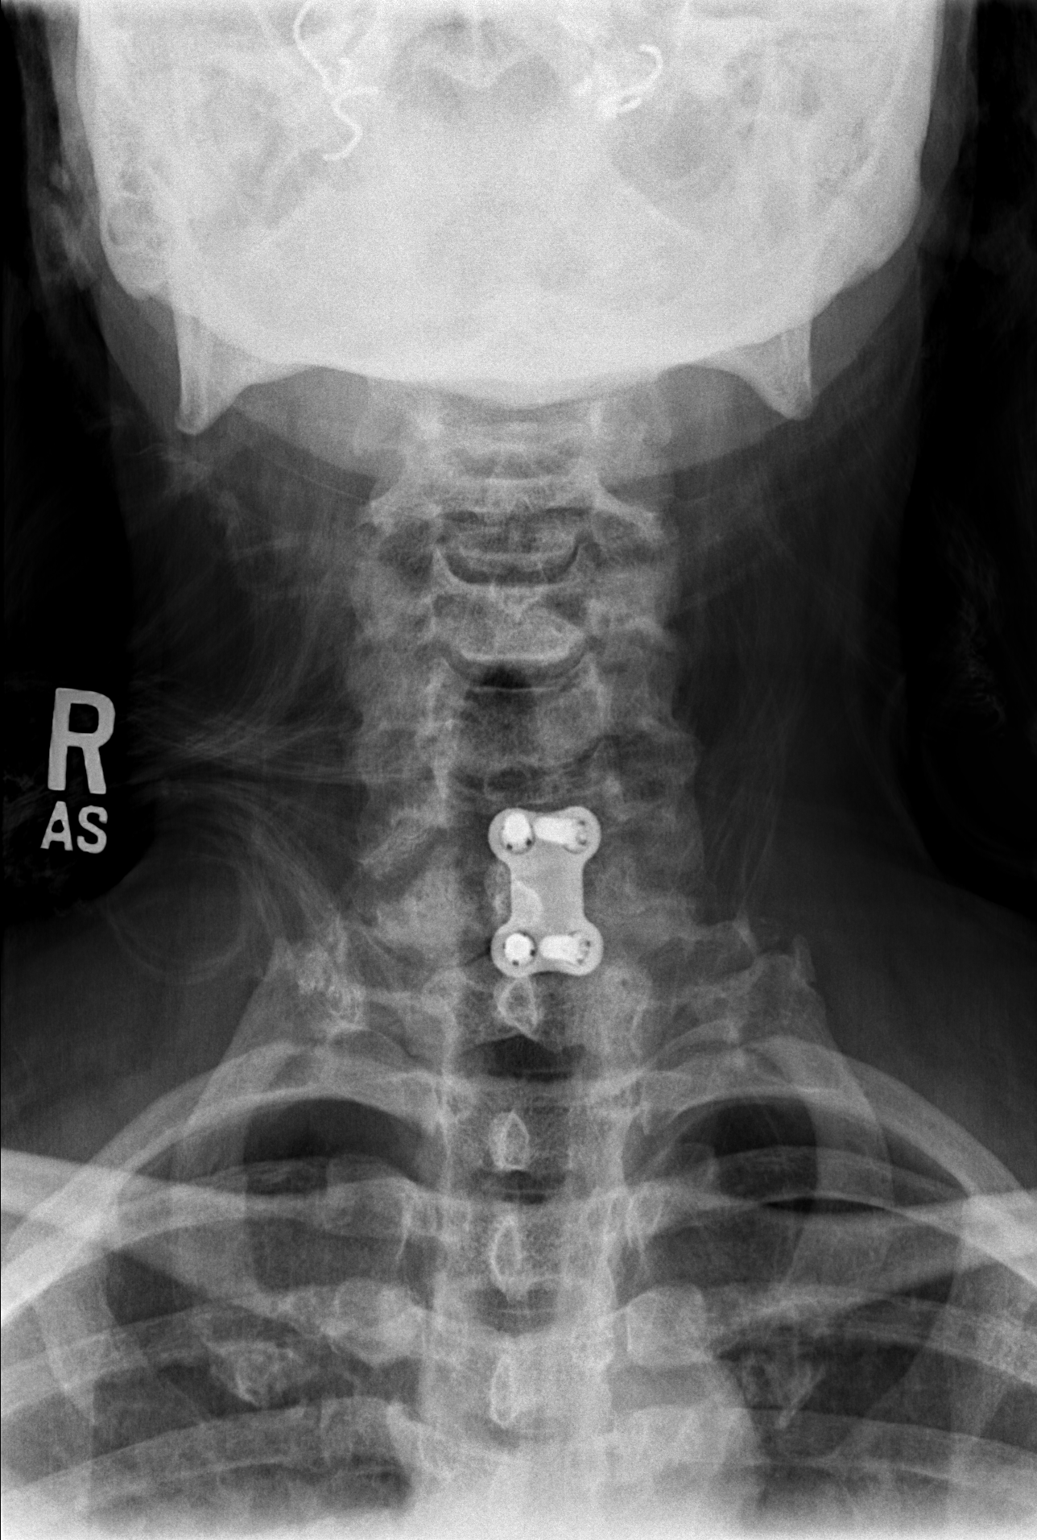

[w c-spine odontoid *]
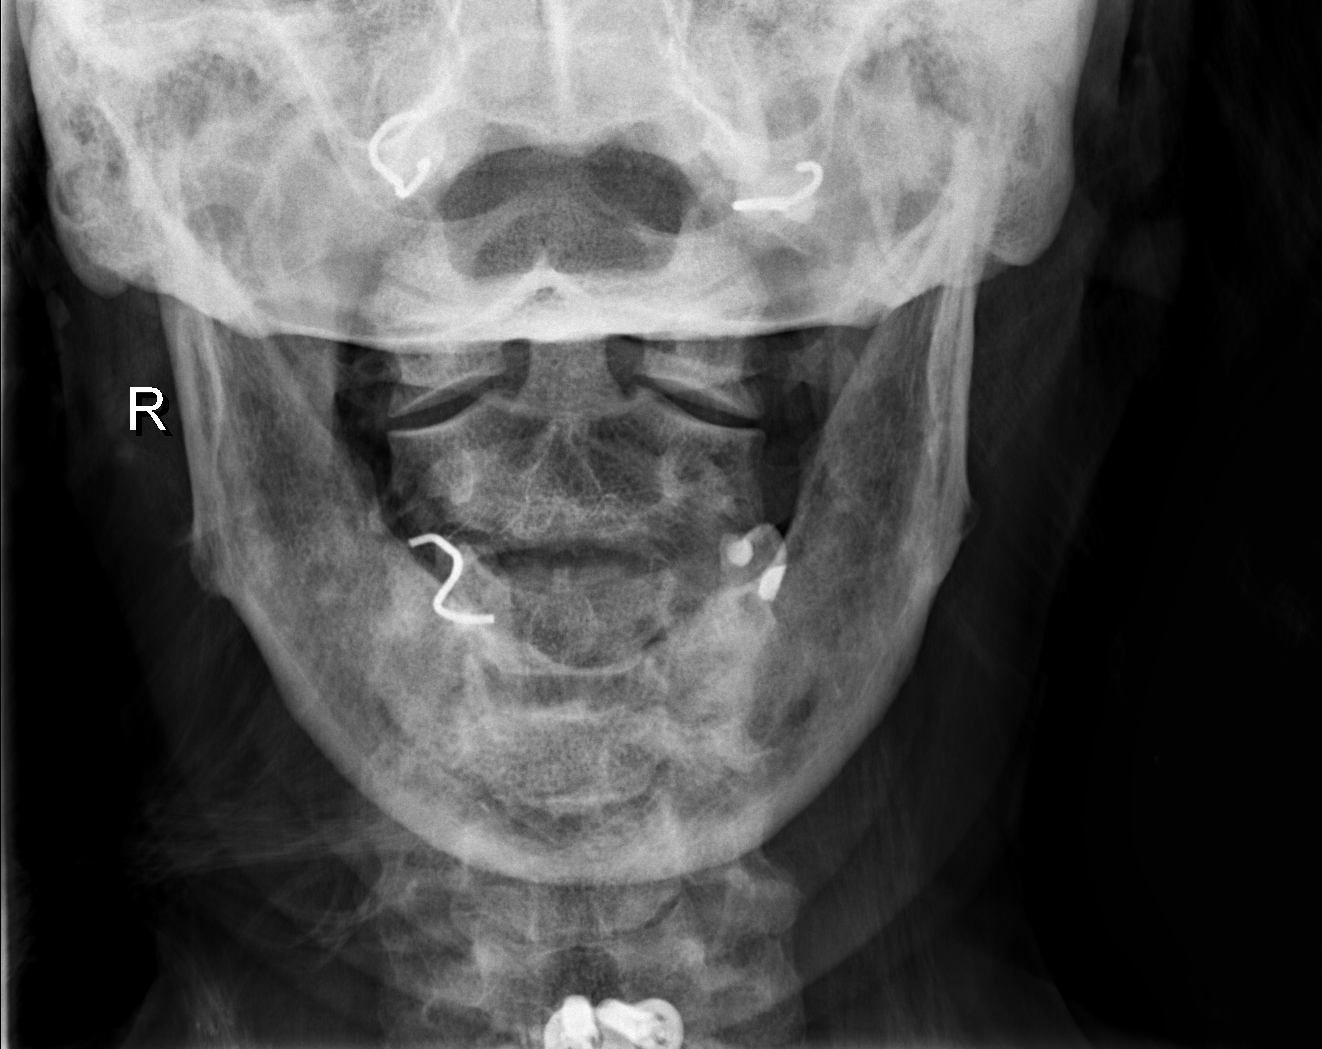

[3 of 3 positions shown; findings below may reference images not displayed]

FINDINGS: Examination demonstrates anterior fusion hardware intact at the C6-7
level. Subtle posterior 1-2 mm subluxation of C5 on C6 unchanged.
Moderate spondylosis throughout the cervical spine. Moderate disc
space narrowing at the C5-6 level unchanged. Prevertebral soft
tissues are within normal. There is uncovertebral joint spurring and
facet arthropathy. Atlantoaxial articulation is normal.
IMPRESSION: No acute findings.

Moderate spondylosis of the cervical spine with moderate disc
disease at the C5-6 level.

Anterior fusion hardware intact at the C6-7 level.

## 2017-04-08 ENCOUNTER — Other Ambulatory Visit: Payer: Self-pay | Admitting: Family Medicine

## 2017-04-08 DIAGNOSIS — R921 Mammographic calcification found on diagnostic imaging of breast: Secondary | ICD-10-CM

## 2017-04-14 ENCOUNTER — Ambulatory Visit
Admission: RE | Admit: 2017-04-14 | Discharge: 2017-04-14 | Disposition: A | Discharge status: Home / Self Care | Payer: Medicare Other | Source: Ambulatory Visit | Attending: Family Medicine | Admitting: Family Medicine

## 2017-04-14 DIAGNOSIS — R921 Mammographic calcification found on diagnostic imaging of breast: Secondary | ICD-10-CM

## 2017-05-16 ENCOUNTER — Other Ambulatory Visit: Payer: Self-pay | Admitting: Neurosurgery

## 2017-05-16 DIAGNOSIS — M542 Cervicalgia: Secondary | ICD-10-CM

## 2017-05-16 DIAGNOSIS — M5416 Radiculopathy, lumbar region: Secondary | ICD-10-CM

## 2017-05-29 ENCOUNTER — Ambulatory Visit
Admission: RE | Admit: 2017-05-29 | Discharge: 2017-05-29 | Disposition: A | Discharge status: Home / Self Care | Payer: Medicare Other | Source: Ambulatory Visit | Attending: Neurosurgery | Admitting: Neurosurgery

## 2017-05-29 DIAGNOSIS — M5416 Radiculopathy, lumbar region: Secondary | ICD-10-CM

## 2017-05-29 DIAGNOSIS — M542 Cervicalgia: Secondary | ICD-10-CM

## 2017-05-29 MED ORDER — GADOBENATE DIMEGLUMINE 529 MG/ML IV SOLN
20.0000 mL | Freq: Once | INTRAVENOUS | Status: AC | PRN
  Administered 2017-05-29: 20 mL via INTRAVENOUS

## 2017-10-10 ENCOUNTER — Other Ambulatory Visit: Payer: Self-pay | Admitting: Neurosurgery

## 2017-10-27 ENCOUNTER — Other Ambulatory Visit (HOSPITAL_COMMUNITY): Payer: Self-pay | Admitting: *Deleted

## 2017-10-27 ENCOUNTER — Encounter (HOSPITAL_COMMUNITY): Payer: Self-pay

## 2017-10-27 ENCOUNTER — Other Ambulatory Visit: Payer: Self-pay

## 2017-10-27 ENCOUNTER — Encounter (HOSPITAL_COMMUNITY)
Admission: RE | Admit: 2017-10-27 | Discharge: 2017-10-27 | Disposition: A | Discharge status: Home / Self Care | Payer: Medicare Other | Source: Ambulatory Visit | Attending: Neurosurgery | Admitting: Neurosurgery

## 2017-10-27 DIAGNOSIS — I10 Essential (primary) hypertension: Secondary | ICD-10-CM | POA: Insufficient documentation

## 2017-10-27 DIAGNOSIS — Z01818 Encounter for other preprocedural examination: Secondary | ICD-10-CM | POA: Insufficient documentation

## 2017-10-27 HISTORY — DX: Nontoxic goiter, unspecified: E04.9

## 2017-10-27 HISTORY — DX: Personal history of other diseases of the digestive system: Z87.19

## 2017-10-27 LAB — SURGICAL PCR SCREEN
MRSA, PCR: POSITIVE — AB
Staphylococcus aureus: POSITIVE — AB

## 2017-10-27 LAB — CBC
HCT: 45 % (ref 36.0–46.0)
Hemoglobin: 14.1 g/dL (ref 12.0–15.0)
MCH: 26.6 pg (ref 26.0–34.0)
MCHC: 31.3 g/dL (ref 30.0–36.0)
MCV: 84.7 fL (ref 78.0–100.0)
Platelets: 323 10*3/uL (ref 150–400)
RBC: 5.31 MIL/uL — ABNORMAL HIGH (ref 3.87–5.11)
RDW: 16 % — ABNORMAL HIGH (ref 11.5–15.5)
WBC: 12 10*3/uL — ABNORMAL HIGH (ref 4.0–10.5)

## 2017-10-27 LAB — BASIC METABOLIC PANEL
Anion gap: 15 (ref 5–15)
BUN: 12 mg/dL (ref 6–20)
CO2: 19 mmol/L — ABNORMAL LOW (ref 22–32)
Calcium: 9.8 mg/dL (ref 8.9–10.3)
Chloride: 104 mmol/L (ref 101–111)
Creatinine, Ser: 0.96 mg/dL (ref 0.44–1.00)
GFR calc Af Amer: >60 mL/min (ref >60)
GFR calc non Af Amer: >60 mL/min (ref >60)
Glucose, Bld: 100 mg/dL — ABNORMAL HIGH (ref 65–99)
Potassium: 3.8 mmol/L (ref 3.5–5.1)
Sodium: 138 mmol/L (ref 135–145)

## 2017-10-27 NOTE — Progress Notes (Addendum)
Pt denies cardiac history, chest pain or sob. Pt states she is not diabetic. Pt has a hx of hyperthyroidism. States she has never been treated for it. Requested most recent TSH level from Dr. Algis Downs. Mitchell's office. Will have Anesthesia PA/NP review results.

## 2017-10-27 NOTE — Pre-Procedure Instructions (Addendum)
Jill FarmerDeanna I [name]  10/27/2017    Your procedure is scheduled on Thursday, October 30, 2017 at 12:30 PM.   [facility] at 10:30 AM.   Call this number if you have problems the morning of surgery: [phone]   Questions prior to day of surgery, please call [phone] between 8 & 4 PM.   Remember:  Do not eat food or drink liquids after midnight Wednesday, 10/29/17.  Take these medicines the morning of surgery with A SIP OF WATER: Atenolol (Tenormin), Gabapentin (Neurontin), Omeprazole (Prilosec), Oxycodone or Tylenol - if needed, Albuterol inhaler - if needed (bring inhaler with you day of surgery)  Stop Aspirin and NSAIDS (Ibuprofen, Aleve, Voltaren, etc) as of today.    Do not wear jewelry, make-up or nail polish.  Do not wear lotions, powders, perfumes or deodorant.  Do not shave 48 hours prior to surgery.    Do not bring valuables to the hospital.  [facility] is not responsible for any belongings or valuables.  Contacts, dentures or bridgework may not be worn into surgery.  Leave your suitcase in the car.  After surgery it may be brought to your room.  For patients admitted to the hospital, discharge time will be determined by your treatment team.  [facility]  Before surgery, you can play an important role.  Because skin is not sterile, your skin needs to be as free of germs as possible.  You can reduce the number of germs on you skin by washing with CHG (chlorahexidine gluconate) soap before surgery.  CHG is an antiseptic cleaner which kills germs and bonds with the skin to continue killing germs even after washing.  Please DO NOT use if you have an allergy to CHG or antibacterial soaps.  If your skin becomes reddened/irritated stop using the CHG and inform your nurse when you arrive at Short Stay.  Do not shave (including legs and underarms) for at least 48 hours prior to the first CHG shower.  You may  shave your face.  Please follow these instructions carefully:   1.  Shower with CHG Soap the night before surgery and the                    morning of Surgery.  2.  If you choose to wash your hair, wash your hair first as usual with your       normal shampoo.  3.  After you shampoo, rinse your hair and body thoroughly to remove the shampoo.  4.  Use CHG as you would any other liquid soap.  You can apply chg directly       to the skin and wash gently with scrungie or a clean washcloth.  5.  Apply the CHG Soap to your body ONLY FROM THE NECK DOWN.        Do not use on open wounds or open sores.  Avoid contact with your eyes, ears, mouth and genitals (private parts).  Wash genitals (private parts) with your normal soap.  6.  Wash thoroughly, paying special attention to the area where your surgery        will be performed.  7.  Thoroughly rinse your body with warm water from the neck down.  8.  DO NOT shower/wash with your normal soap after using and rinsing off       the CHG Soap.  9.  Pat yourself dry with a  clean towel.            10.  Wear clean pajamas.            11.  Place clean sheets on your bed the night of your first shower and do not        sleep with pets.  Day of Surgery  Do not apply any lotions/deodorants the morning of surgery.  Please wear clean clothes to the hospital.   Please read over the fact sheets that you were given.

## 2017-10-28 ENCOUNTER — Encounter (HOSPITAL_COMMUNITY): Payer: Self-pay

## 2017-10-28 LAB — TYPE AND SCREEN
ABO/RH(D): B POS
Antibody Screen: NEGATIVE

## 2017-10-30 ENCOUNTER — Inpatient Hospital Stay (HOSPITAL_COMMUNITY): Payer: Medicare Other | Admitting: Emergency Medicine

## 2017-10-30 ENCOUNTER — Inpatient Hospital Stay (HOSPITAL_COMMUNITY): Payer: Medicare Other | Admitting: Certified Registered Nurse Anesthetist

## 2017-10-30 ENCOUNTER — Inpatient Hospital Stay (HOSPITAL_COMMUNITY): Payer: Medicare Other

## 2017-10-30 ENCOUNTER — Encounter (HOSPITAL_COMMUNITY): Payer: Self-pay | Admitting: Orthopedic Surgery

## 2017-10-30 ENCOUNTER — Inpatient Hospital Stay (HOSPITAL_COMMUNITY)
Admission: RE | Admit: 2017-10-30 | Discharge: 2017-11-04 | DRG: 460 | Disposition: A | Discharge status: Home Health | Payer: Medicare Other | Source: Ambulatory Visit | Attending: Neurosurgery | Admitting: Neurosurgery

## 2017-10-30 ENCOUNTER — Encounter (HOSPITAL_COMMUNITY)
Admission: RE | Disposition: A | Discharge status: Home Health | Payer: Self-pay | Source: Ambulatory Visit | Attending: Neurosurgery

## 2017-10-30 DIAGNOSIS — Z79891 Long term (current) use of opiate analgesic: Secondary | ICD-10-CM | POA: Diagnosis not present

## 2017-10-30 DIAGNOSIS — M549 Dorsalgia, unspecified: Secondary | ICD-10-CM | POA: Diagnosis present

## 2017-10-30 DIAGNOSIS — Z9109 Other allergy status, other than to drugs and biological substances: Secondary | ICD-10-CM | POA: Diagnosis not present

## 2017-10-30 DIAGNOSIS — M4316 Spondylolisthesis, lumbar region: Principal | ICD-10-CM | POA: Diagnosis present

## 2017-10-30 DIAGNOSIS — Z419 Encounter for procedure for purposes other than remedying health state, unspecified: Secondary | ICD-10-CM

## 2017-10-30 DIAGNOSIS — K449 Diaphragmatic hernia without obstruction or gangrene: Secondary | ICD-10-CM | POA: Diagnosis present

## 2017-10-30 DIAGNOSIS — Z91040 Latex allergy status: Secondary | ICD-10-CM | POA: Diagnosis not present

## 2017-10-30 DIAGNOSIS — Z91013 Allergy to seafood: Secondary | ICD-10-CM | POA: Diagnosis not present

## 2017-10-30 DIAGNOSIS — D649 Anemia, unspecified: Secondary | ICD-10-CM | POA: Diagnosis present

## 2017-10-30 DIAGNOSIS — K219 Gastro-esophageal reflux disease without esophagitis: Secondary | ICD-10-CM | POA: Diagnosis present

## 2017-10-30 DIAGNOSIS — M797 Fibromyalgia: Secondary | ICD-10-CM | POA: Diagnosis present

## 2017-10-30 DIAGNOSIS — J45909 Unspecified asthma, uncomplicated: Secondary | ICD-10-CM | POA: Diagnosis present

## 2017-10-30 DIAGNOSIS — Z888 Allergy status to other drugs, medicaments and biological substances status: Secondary | ICD-10-CM

## 2017-10-30 DIAGNOSIS — I10 Essential (primary) hypertension: Secondary | ICD-10-CM | POA: Diagnosis present

## 2017-10-30 DIAGNOSIS — Z7982 Long term (current) use of aspirin: Secondary | ICD-10-CM

## 2017-10-30 SURGERY — POSTERIOR LUMBAR FUSION 1 LEVEL
Anesthesia: General | Site: Back

## 2017-10-30 MED ORDER — SODIUM CHLORIDE 0.9% FLUSH: 3.0000 mL | INTRAVENOUS | Status: DC | PRN

## 2017-10-30 MED ORDER — ONDANSETRON HCL 4 MG/2ML IJ SOLN
INTRAMUSCULAR | Status: AC
  Filled 2017-10-30: qty 4

## 2017-10-30 MED ORDER — CHLORHEXIDINE GLUCONATE CLOTH 2 % EX PADS: 6.0000 | MEDICATED_PAD | Freq: Once | CUTANEOUS | Status: DC

## 2017-10-30 MED ORDER — ACETAMINOPHEN 650 MG RE SUPP: 650.0000 mg | RECTAL | Status: DC | PRN

## 2017-10-30 MED ORDER — MIDAZOLAM HCL 2 MG/2ML IJ SOLN
INTRAMUSCULAR | Status: AC
  Filled 2017-10-30: qty 2

## 2017-10-30 MED ORDER — ROCURONIUM BROMIDE 10 MG/ML (PF) SYRINGE
PREFILLED_SYRINGE | INTRAVENOUS | Status: DC | PRN
  Administered 2017-10-30 (×2): 20 mg via INTRAVENOUS
  Administered 2017-10-30: 30 mg via INTRAVENOUS
  Administered 2017-10-30: 50 mg via INTRAVENOUS

## 2017-10-30 MED ORDER — OXYCODONE HCL 5 MG PO TABS: 5.0000 mg | ORAL_TABLET | ORAL | Status: DC | PRN

## 2017-10-30 MED ORDER — MIDAZOLAM HCL 2 MG/2ML IJ SOLN
INTRAMUSCULAR | Status: DC | PRN
  Administered 2017-10-30: 2 mg via INTRAVENOUS

## 2017-10-30 MED ORDER — ROCURONIUM BROMIDE 10 MG/ML (PF) SYRINGE
PREFILLED_SYRINGE | INTRAVENOUS | Status: AC
  Filled 2017-10-30: qty 5

## 2017-10-30 MED ORDER — ATENOLOL 25 MG PO TABS
50.0000 mg | ORAL_TABLET | Freq: Every day | ORAL | Status: DC
  Administered 2017-10-31 – 2017-11-04 (×5): 50 mg via ORAL
  Filled 2017-10-30 (×5): qty 2

## 2017-10-30 MED ORDER — OXYCODONE HCL ER 15 MG PO T12A
15.0000 mg | EXTENDED_RELEASE_TABLET | Freq: Two times a day (BID) | ORAL | Status: DC
  Administered 2017-10-30 – 2017-11-04 (×10): 15 mg via ORAL
  Filled 2017-10-30 (×10): qty 1

## 2017-10-30 MED ORDER — DOCUSATE SODIUM 100 MG PO CAPS
100.0000 mg | ORAL_CAPSULE | Freq: Two times a day (BID) | ORAL | Status: DC
  Administered 2017-10-30 – 2017-11-01 (×3): 100 mg via ORAL
  Filled 2017-10-30 (×5): qty 1

## 2017-10-30 MED ORDER — ONDANSETRON HCL 4 MG/2ML IJ SOLN
INTRAMUSCULAR | Status: AC
  Filled 2017-10-30: qty 2

## 2017-10-30 MED ORDER — CALCIUM CARBONATE-VITAMIN D 500-200 MG-UNIT PO TABS
1.0000 | ORAL_TABLET | ORAL | Status: DC
  Administered 2017-11-03: 09:00:00 1 via ORAL
  Filled 2017-10-30: qty 1

## 2017-10-30 MED ORDER — DEXAMETHASONE SODIUM PHOSPHATE 10 MG/ML IJ SOLN
INTRAMUSCULAR | Status: DC | PRN
  Administered 2017-10-30: 10 mg via INTRAVENOUS

## 2017-10-30 MED ORDER — MENTHOL 3 MG MT LOZG: 1.0000 | LOZENGE | OROMUCOSAL | Status: DC | PRN

## 2017-10-30 MED ORDER — ASPIRIN EC 81 MG PO TBEC
81.0000 mg | DELAYED_RELEASE_TABLET | Freq: Every day | ORAL | Status: DC
  Administered 2017-10-31 – 2017-11-04 (×5): 81 mg via ORAL
  Filled 2017-10-30 (×5): qty 1

## 2017-10-30 MED ORDER — SODIUM CHLORIDE 0.9 % IV SOLN: 250.0000 mL | INTRAVENOUS | Status: DC

## 2017-10-30 MED ORDER — OXYCODONE-ACETAMINOPHEN 5-325 MG PO TABS: 1.0000 | ORAL_TABLET | ORAL | Status: DC | PRN

## 2017-10-30 MED ORDER — THROMBIN (RECOMBINANT) 20000 UNITS EX SOLR
CUTANEOUS | Status: DC | PRN
  Administered 2017-10-30: 16:00:00 via TOPICAL

## 2017-10-30 MED ORDER — THROMBIN (RECOMBINANT) 20000 UNITS EX SOLR
CUTANEOUS | Status: AC
  Filled 2017-10-30: qty 20000

## 2017-10-30 MED ORDER — EPHEDRINE 5 MG/ML INJ
INTRAVENOUS | Status: AC
  Filled 2017-10-30: qty 10

## 2017-10-30 MED ORDER — DIPHENHYDRAMINE HCL 25 MG PO CAPS
50.0000 mg | ORAL_CAPSULE | Freq: Three times a day (TID) | ORAL | Status: DC
  Administered 2017-10-30: 50 mg via ORAL
  Filled 2017-10-30: qty 2

## 2017-10-30 MED ORDER — BISACODYL 5 MG PO TBEC: 5.0000 mg | DELAYED_RELEASE_TABLET | Freq: Every day | ORAL | Status: DC | PRN

## 2017-10-30 MED ORDER — HYDROMORPHONE HCL 1 MG/ML IJ SOLN
INTRAMUSCULAR | Status: AC
  Filled 2017-10-30: qty 1

## 2017-10-30 MED ORDER — ALUM & MAG HYDROXIDE-SIMETH 200-200-20 MG/5ML PO SUSP: 30.0000 mL | Freq: Four times a day (QID) | ORAL | Status: DC | PRN

## 2017-10-30 MED ORDER — PHENYLEPHRINE-APAP-GUAIFENESIN 5-325-200 MG PO TABS: 2.0000 | ORAL_TABLET | Freq: Three times a day (TID) | ORAL | Status: DC

## 2017-10-30 MED ORDER — ONDANSETRON HCL 4 MG/2ML IJ SOLN: 4.0000 mg | Freq: Four times a day (QID) | INTRAMUSCULAR | Status: DC | PRN

## 2017-10-30 MED ORDER — PHENYLEPHRINE 40 MCG/ML (10ML) SYRINGE FOR IV PUSH (FOR BLOOD PRESSURE SUPPORT)
PREFILLED_SYRINGE | INTRAVENOUS | Status: AC
  Filled 2017-10-30: qty 10

## 2017-10-30 MED ORDER — ONDANSETRON HCL 4 MG PO TABS: 4.0000 mg | ORAL_TABLET | Freq: Four times a day (QID) | ORAL | Status: DC | PRN

## 2017-10-30 MED ORDER — SUGAMMADEX SODIUM 200 MG/2ML IV SOLN
INTRAVENOUS | Status: AC
  Filled 2017-10-30: qty 2

## 2017-10-30 MED ORDER — THROMBIN (RECOMBINANT) 5000 UNITS EX SOLR
CUTANEOUS | Status: AC
  Filled 2017-10-30: qty 5000

## 2017-10-30 MED ORDER — ALBUTEROL SULFATE (2.5 MG/3ML) 0.083% IN NEBU: 3.0000 mL | INHALATION_SOLUTION | Freq: Four times a day (QID) | RESPIRATORY_TRACT | Status: DC | PRN

## 2017-10-30 MED ORDER — DEXAMETHASONE SODIUM PHOSPHATE 10 MG/ML IJ SOLN
INTRAMUSCULAR | Status: AC
  Filled 2017-10-30: qty 1

## 2017-10-30 MED ORDER — HYDROCHLOROTHIAZIDE 25 MG PO TABS
25.0000 mg | ORAL_TABLET | Freq: Every day | ORAL | Status: DC
  Administered 2017-10-31 – 2017-11-04 (×5): 25 mg via ORAL
  Filled 2017-10-30 (×5): qty 1

## 2017-10-30 MED ORDER — ACETAMINOPHEN 325 MG PO TABS
650.0000 mg | ORAL_TABLET | ORAL | Status: DC | PRN
  Administered 2017-11-02: 650 mg via ORAL
  Filled 2017-10-30: qty 2

## 2017-10-30 MED ORDER — PROPOFOL 10 MG/ML IV BOLUS
INTRAVENOUS | Status: AC
Start: 2017-10-30 — End: 2017-10-30
  Filled 2017-10-30: qty 40

## 2017-10-30 MED ORDER — 0.9 % SODIUM CHLORIDE (POUR BTL) OPTIME
TOPICAL | Status: DC | PRN
  Administered 2017-10-30: 1000 mL

## 2017-10-30 MED ORDER — BUPIVACAINE HCL (PF) 0.5 % IJ SOLN
INTRAMUSCULAR | Status: DC | PRN
  Administered 2017-10-30: 30 mL

## 2017-10-30 MED ORDER — DIAZEPAM 5 MG PO TABS
5.0000 mg | ORAL_TABLET | Freq: Four times a day (QID) | ORAL | Status: DC | PRN
  Administered 2017-10-30 – 2017-11-03 (×10): 5 mg via ORAL
  Filled 2017-10-30 (×9): qty 1

## 2017-10-30 MED ORDER — DEXAMETHASONE SODIUM PHOSPHATE 10 MG/ML IJ SOLN
INTRAMUSCULAR | Status: AC
  Filled 2017-10-30: qty 2

## 2017-10-30 MED ORDER — OXYCODONE HCL 5 MG PO TABS
ORAL_TABLET | ORAL | Status: AC
  Filled 2017-10-30: qty 2

## 2017-10-30 MED ORDER — CELECOXIB 200 MG PO CAPS
200.0000 mg | ORAL_CAPSULE | Freq: Two times a day (BID) | ORAL | Status: DC
  Administered 2017-11-03: 200 mg via ORAL
  Filled 2017-10-30 (×5): qty 1

## 2017-10-30 MED ORDER — HYDROMORPHONE HCL 1 MG/ML IJ SOLN
0.2500 mg | INTRAMUSCULAR | Status: DC | PRN
  Administered 2017-10-30 (×4): 0.5 mg via INTRAVENOUS

## 2017-10-30 MED ORDER — SODIUM CHLORIDE 0.9% FLUSH
3.0000 mL | Freq: Two times a day (BID) | INTRAVENOUS | Status: DC
  Administered 2017-10-31 – 2017-11-04 (×6): 3 mL via INTRAVENOUS

## 2017-10-30 MED ORDER — DIAZEPAM 5 MG PO TABS
ORAL_TABLET | ORAL | Status: AC
  Filled 2017-10-30: qty 1

## 2017-10-30 MED ORDER — OXYCODONE HCL 5 MG PO TABS
10.0000 mg | ORAL_TABLET | ORAL | Status: DC | PRN
  Administered 2017-10-30 – 2017-11-04 (×16): 10 mg via ORAL
  Filled 2017-10-30 (×15): qty 2

## 2017-10-30 MED ORDER — LIDOCAINE 2% (20 MG/ML) 5 ML SYRINGE
INTRAMUSCULAR | Status: DC | PRN
  Administered 2017-10-30: 80 mg via INTRAVENOUS

## 2017-10-30 MED ORDER — LIDOCAINE-EPINEPHRINE 0.5 %-1:200000 IJ SOLN
INTRAMUSCULAR | Status: AC
  Filled 2017-10-30: qty 1

## 2017-10-30 MED ORDER — POTASSIUM CHLORIDE IN NACL 20-0.9 MEQ/L-% IV SOLN: INTRAVENOUS | Status: DC

## 2017-10-30 MED ORDER — SUGAMMADEX SODIUM 200 MG/2ML IV SOLN
INTRAVENOUS | Status: DC | PRN
  Administered 2017-10-30: 200 mg via INTRAVENOUS

## 2017-10-30 MED ORDER — PROPOFOL 10 MG/ML IV BOLUS
INTRAVENOUS | Status: DC | PRN
  Administered 2017-10-30: 20 mg via INTRAVENOUS
  Administered 2017-10-30: 200 mg via INTRAVENOUS
  Administered 2017-10-30 (×2): 20 mg via INTRAVENOUS

## 2017-10-30 MED ORDER — LUBIPROSTONE 24 MCG PO CAPS
24.0000 ug | ORAL_CAPSULE | Freq: Two times a day (BID) | ORAL | Status: DC
Start: 2017-10-30 — End: 2017-11-04
  Administered 2017-10-31 – 2017-11-04 (×9): 24 ug via ORAL
  Filled 2017-10-30 (×9): qty 1

## 2017-10-30 MED ORDER — CHLORZOXAZONE 500 MG PO TABS
500.0000 mg | ORAL_TABLET | Freq: Three times a day (TID) | ORAL | Status: DC
  Administered 2017-10-30 – 2017-11-04 (×15): 500 mg via ORAL
  Filled 2017-10-30 (×16): qty 1

## 2017-10-30 MED ORDER — MAGNESIUM CITRATE PO SOLN: 1.0000 | Freq: Once | ORAL | Status: DC | PRN

## 2017-10-30 MED ORDER — OXYCODONE-ACETAMINOPHEN 10-325 MG PO TABS: 1.0000 | ORAL_TABLET | ORAL | Status: DC | PRN

## 2017-10-30 MED ORDER — SENNOSIDES-DOCUSATE SODIUM 8.6-50 MG PO TABS: 1.0000 | ORAL_TABLET | Freq: Every evening | ORAL | Status: DC | PRN

## 2017-10-30 MED ORDER — ONDANSETRON HCL 4 MG/2ML IJ SOLN
INTRAMUSCULAR | Status: DC | PRN
  Administered 2017-10-30: 4 mg via INTRAVENOUS

## 2017-10-30 MED ORDER — PROPOFOL 10 MG/ML IV BOLUS
INTRAVENOUS | Status: AC
  Filled 2017-10-30: qty 20

## 2017-10-30 MED ORDER — MORPHINE SULFATE (PF) 2 MG/ML IV SOLN: 2.0000 mg | INTRAVENOUS | Status: DC | PRN

## 2017-10-30 MED ORDER — PANTOPRAZOLE SODIUM 40 MG PO TBEC
40.0000 mg | DELAYED_RELEASE_TABLET | Freq: Every day | ORAL | Status: DC
  Administered 2017-10-31 – 2017-11-04 (×5): 40 mg via ORAL
  Filled 2017-10-30 (×5): qty 1

## 2017-10-30 MED ORDER — ROCURONIUM BROMIDE 10 MG/ML (PF) SYRINGE
PREFILLED_SYRINGE | INTRAVENOUS | Status: AC
  Filled 2017-10-30: qty 10

## 2017-10-30 MED ORDER — PHENOL 1.4 % MT LIQD: 1.0000 | OROMUCOSAL | Status: DC | PRN

## 2017-10-30 MED ORDER — FENTANYL CITRATE (PF) 250 MCG/5ML IJ SOLN
INTRAMUSCULAR | Status: AC
  Filled 2017-10-30: qty 5

## 2017-10-30 MED ORDER — ZOLPIDEM TARTRATE 5 MG PO TABS
5.0000 mg | ORAL_TABLET | Freq: Every evening | ORAL | Status: DC | PRN
  Administered 2017-11-01: 5 mg via ORAL
  Filled 2017-10-30: qty 1

## 2017-10-30 MED ORDER — LIDOCAINE 2% (20 MG/ML) 5 ML SYRINGE
INTRAMUSCULAR | Status: AC
  Filled 2017-10-30: qty 10

## 2017-10-30 MED ORDER — GABAPENTIN 600 MG PO TABS
900.0000 mg | ORAL_TABLET | Freq: Three times a day (TID) | ORAL | Status: DC
  Administered 2017-10-30 – 2017-11-04 (×15): 900 mg via ORAL
  Filled 2017-10-30 (×16): qty 2

## 2017-10-30 MED ORDER — LIDOCAINE 2% (20 MG/ML) 5 ML SYRINGE
INTRAMUSCULAR | Status: AC
  Filled 2017-10-30: qty 5

## 2017-10-30 MED ORDER — CEFAZOLIN SODIUM-DEXTROSE 2-4 GM/100ML-% IV SOLN
2.0000 g | INTRAVENOUS | Status: AC
  Administered 2017-10-30: 2 g via INTRAVENOUS

## 2017-10-30 MED ORDER — BUPIVACAINE HCL (PF) 0.5 % IJ SOLN
INTRAMUSCULAR | Status: AC
  Filled 2017-10-30: qty 30

## 2017-10-30 MED ORDER — LACTATED RINGERS IV SOLN
INTRAVENOUS | Status: DC
  Administered 2017-10-30 (×2): via INTRAVENOUS

## 2017-10-30 MED ORDER — FENTANYL CITRATE (PF) 250 MCG/5ML IJ SOLN
INTRAMUSCULAR | Status: DC | PRN
  Administered 2017-10-30: 100 ug via INTRAVENOUS
  Administered 2017-10-30 (×3): 50 ug via INTRAVENOUS

## 2017-10-30 MED ORDER — LIDOCAINE-EPINEPHRINE 0.5 %-1:200000 IJ SOLN
INTRAMUSCULAR | Status: DC | PRN
  Administered 2017-10-30: 10 mL

## 2017-10-30 SURGICAL SUPPLY — 75 items
ADH SKN CLS APL DERMABOND .7 (GAUZE/BANDAGES/DRESSINGS) ×1
APL SKNCLS STERI-STRIP NONHPOA (GAUZE/BANDAGES/DRESSINGS)
BENZOIN TINCTURE PRP APPL 2/3 (GAUZE/BANDAGES/DRESSINGS) IMPLANT
BLADE CLIPPER SURG (BLADE) IMPLANT
BUR MATCHSTICK NEURO 3.0 LAGG (BURR) ×3 IMPLANT
BUR PRECISION FLUTE 5.0 (BURR) ×3 IMPLANT
CAGE BULLET CONCORDE 9X8X23 (Cage) ×4 IMPLANT
CANISTER SUCT 3000ML PPV (MISCELLANEOUS) ×3 IMPLANT
CAP RELINE MOD TULIP RMM (Cap) ×8 IMPLANT
CARTRIDGE OIL MAESTRO DRILL (MISCELLANEOUS) ×1 IMPLANT
CLOSURE WOUND 1/2 X4 (GAUZE/BANDAGES/DRESSINGS)
CONT SPEC 4OZ CLIKSEAL STRL BL (MISCELLANEOUS) ×3 IMPLANT
COVER BACK TABLE 60X90IN (DRAPES) ×3 IMPLANT
DECANTER SPIKE VIAL GLASS SM (MISCELLANEOUS) ×3 IMPLANT
DERMABOND ADVANCED (GAUZE/BANDAGES/DRESSINGS) ×2
DERMABOND ADVANCED .7 DNX12 (GAUZE/BANDAGES/DRESSINGS) ×1 IMPLANT
DIFFUSER DRILL AIR PNEUMATIC (MISCELLANEOUS) ×3 IMPLANT
DRAPE C-ARM 42X72 X-RAY (DRAPES) ×4 IMPLANT
DRAPE C-ARMOR (DRAPES) ×2 IMPLANT
DRAPE LAPAROTOMY 100X72X124 (DRAPES) ×3 IMPLANT
DRAPE POUCH INSTRU U-SHP 10X18 (DRAPES) ×3 IMPLANT
DRAPE SURG 17X23 STRL (DRAPES) ×3 IMPLANT
DRSG OPSITE POSTOP 4X6 (GAUZE/BANDAGES/DRESSINGS) ×2 IMPLANT
DURAPREP 26ML APPLICATOR (WOUND CARE) ×3 IMPLANT
ELECT BLADE 4.0 EZ CLEAN MEGAD (MISCELLANEOUS) ×3
ELECT BLADE 6.5 EXT (BLADE) IMPLANT
ELECT CAUTERY BLADE 6.4 (BLADE) ×2 IMPLANT
ELECT REM PT RETURN 9FT ADLT (ELECTROSURGICAL) ×3
ELECTRODE BLDE 4.0 EZ CLN MEGD (MISCELLANEOUS) IMPLANT
ELECTRODE REM PT RTRN 9FT ADLT (ELECTROSURGICAL) ×1 IMPLANT
GAUZE SPONGE 4X4 12PLY STRL (GAUZE/BANDAGES/DRESSINGS) IMPLANT
GAUZE SPONGE 4X4 16PLY XRAY LF (GAUZE/BANDAGES/DRESSINGS) IMPLANT
GLOVE BIO SURGEON STRL SZ8 (GLOVE) ×2 IMPLANT
GLOVE BIOGEL PI IND STRL 7.0 (GLOVE) IMPLANT
GLOVE BIOGEL PI IND STRL 7.5 (GLOVE) IMPLANT
GLOVE BIOGEL PI INDICATOR 7.0 (GLOVE) ×6
GLOVE BIOGEL PI INDICATOR 7.5 (GLOVE) ×6
GLOVE ECLIPSE 6.5 STRL STRAW (GLOVE) ×6 IMPLANT
GLOVE EXAM NITRILE LRG STRL (GLOVE) IMPLANT
GLOVE EXAM NITRILE XL STR (GLOVE) IMPLANT
GLOVE EXAM NITRILE XS STR PU (GLOVE) IMPLANT
GOWN STRL REUS W/ TWL LRG LVL3 (GOWN DISPOSABLE) ×2 IMPLANT
GOWN STRL REUS W/ TWL XL LVL3 (GOWN DISPOSABLE) IMPLANT
GOWN STRL REUS W/TWL 2XL LVL3 (GOWN DISPOSABLE) IMPLANT
GOWN STRL REUS W/TWL LRG LVL3 (GOWN DISPOSABLE) ×6
GOWN STRL REUS W/TWL XL LVL3 (GOWN DISPOSABLE) ×9
KIT BASIN OR (CUSTOM PROCEDURE TRAY) ×3 IMPLANT
KIT POSITION SURG JACKSON T1 (MISCELLANEOUS) ×3 IMPLANT
KIT ROOM TURNOVER OR (KITS) ×3 IMPLANT
NDL HYPO 25X1 1.5 SAFETY (NEEDLE) ×1 IMPLANT
NDL SPNL 18GX3.5 QUINCKE PK (NEEDLE) IMPLANT
NEEDLE HYPO 25X1 1.5 SAFETY (NEEDLE) ×3 IMPLANT
NEEDLE SPNL 18GX3.5 QUINCKE PK (NEEDLE) IMPLANT
NS IRRIG 1000ML POUR BTL (IV SOLUTION) ×3 IMPLANT
OIL CARTRIDGE MAESTRO DRILL (MISCELLANEOUS) ×3
PACK LAMINECTOMY NEURO (CUSTOM PROCEDURE TRAY) ×3 IMPLANT
PAD ARMBOARD 7.5X6 YLW CONV (MISCELLANEOUS) ×6 IMPLANT
PENCIL BUTTON HOLSTER BLD 10FT (ELECTRODE) ×2 IMPLANT
ROD RELINE COCR LORD 5.0X35 (Rod) ×2 IMPLANT
ROD RELINE COCR LORD 5X30 (Rod) ×2 IMPLANT
SCREW LOCK RSS 4.5/5.0MM (Screw) ×8 IMPLANT
SCREW SHANK RELINE 6.5X40MM (Screw) ×4 IMPLANT
SCREW SHANK RELINE MOD 5.5X30 (Screw) ×4 IMPLANT
SPONGE LAP 4X18 X RAY DECT (DISPOSABLE) IMPLANT
SPONGE SURGIFOAM ABS GEL 100 (HEMOSTASIS) ×3 IMPLANT
STRIP CLOSURE SKIN 1/2X4 (GAUZE/BANDAGES/DRESSINGS) IMPLANT
SUT PROLENE 6 0 BV (SUTURE) IMPLANT
SUT VIC AB 0 CT1 18XCR BRD8 (SUTURE) ×1 IMPLANT
SUT VIC AB 0 CT1 8-18 (SUTURE) ×6
SUT VIC AB 2-0 CT1 18 (SUTURE) ×3 IMPLANT
SUT VIC AB 3-0 SH 8-18 (SUTURE) ×5 IMPLANT
TOWEL GREEN STERILE (TOWEL DISPOSABLE) ×3 IMPLANT
TOWEL GREEN STERILE FF (TOWEL DISPOSABLE) ×3 IMPLANT
TRAY FOLEY W/METER SILVER 16FR (SET/KITS/TRAYS/PACK) ×3 IMPLANT
WATER STERILE IRR 1000ML POUR (IV SOLUTION) ×3 IMPLANT

## 2017-10-30 NOTE — Progress Notes (Signed)
Cell phone with case retrieved from security at patient's request.  Cell phone with gold case returned to patient.  Pt educated regarding belongings including facility not responsible for personal belongings if lost and pt verbalizes understanding.

## 2017-10-30 NOTE — Op Note (Signed)
BP (!) 146/88   Pulse 89   Temp 98.4 F (36.9 C) (Oral)   Resp 20   Ht 5' 2.99" (1.6 m)   Wt 95.7 kg (210 lb 14.4 oz)   LMP 02/18/2016   SpO2 98%   BMI 37.37 kg/m  10/30/2017  6:24 PM  PATIENT:  [name]  57 y.o. female with severe stenosis and listhesis at L4/5. She has severe pain in the lower extremities due to this, she has agreed to undergo operative decompression.   PRE-OPERATIVE DIAGNOSIS:  SPONDYLOLISTHESIS, LUMBAR REGION L4/5 POST-OPERATIVE DIAGNOSIS:  SPONDYLOLISTHESIS, LUMBAR REGION L4/5 PROCEDURE:  Procedure(s): L4, L5 laminectomy for decompression of the L4 , and L5 nerve roots well beyond the needed exposure for a PLIF POSTERIOR LUMBAR INTERBODY FUSION LUMBAR FOUR- LUMBAR FIVE With  Synthes Peek implants packed with autograft morsels Non segmental pedicle screw fixation L4/5 Nuvasive mas plif screws SURGEON:  Surgeon(s): Coletta Memosabbell, Carl Butner, MD  ASSISTANTS:Jones, Onalee Huaavid  ANESTHESIA:   general  EBL:  Total I/O In: 1700 [I.V.:1700] Out: 400 [Urine:200; Blood:200]  BLOOD ADMINISTERED:none  CELL SAVER GIVEN:none  COUNT:per nursing  DRAINS: none   SPECIMEN:  No Specimen  DICTATION: Jill FarmerDeanna I Heikes is a 57 y.o. female whom was taken to the operating room intubated, and placed under a general anesthetic without difficulty. A foley catheter was placed under sterile conditions. She was positioned prone on a Jackson stable with all pressure points properly padded.  Her lumbar region was prepped and draped in a sterile manner. I infiltrated 20cc's 1/2%lidocaine/1:2000,000 strength epinephrine into the planned incision. I opened the skin with a 10 blade and took the incision down to the thoracolumbar fascia. I exposed the lamina of L3,4,and 5 in a subperiosteal fashion bilaterally. I confirmed my location with an intraoperative xray.  I placed self retaining retractors and started the decompression.  I decompressed the spinal canal via a laminectomy of L4 well beyond  the needed exposure for a PLIF. I used the drill and Kerrison punches to remove the bone, and decompress the spinal canal and also the neural foramina bilaterally. I performed complete L4 inferior facetectomies bilaterally to aid in the decompression of the nerve roots.Once completed I then started the PLIF's. PLIF's were performed at L4/5 in the same fashion. I opened the disc space with a 15 blade then used a variety of instruments to remove the disc and prepare the space for the arthrodesis. I used curettes, rongeurs, punches, shavers for the disc space, and rasps in the discetomy. I measured the disc space and placed 8mm Peek cages(Synthes) into the disc space(s).  I We placed pedicle screws at L4, and L5, using fluoroscopic guidance. I drilled a pilot hole, then cannulated the pedicle with a tap at each site. We then tapped each pedicle, assessing each site for pedicle violations. No cutouts were appreciated. Screws (Nuvasive mas plif) were then placed at each site without difficulty. I attached rods and locking caps with the appropriate tools. The locking caps were secured with torque limited screwdrivers. Final films were performed and the final construct appeared to be in good position.  We closed the wound in a layered fashion. We approximated the thoracolumbar fascia, subcutaneous, and subcuticular planes with vicryl sutures. I used dermabond, and an occlusive bandage for a sterile dressing.     PLAN OF CARE: Admit to inpatient   PATIENT DISPOSITION:  PACU - hemodynamically stable.   Delay start of Pharmacological VTE agent (>24hrs) due to surgical blood loss or risk  of bleeding:  yes

## 2017-10-30 NOTE — Anesthesia Preprocedure Evaluation (Addendum)
Anesthesia Evaluation  Patient identified by MRN, date of birth, ID band Patient awake    Reviewed: Allergy & Precautions, NPO status , Patient's Chart, lab work & pertinent test results  Airway Mallampati: II       Dental   Pulmonary shortness of breath, asthma , former smoker,    breath sounds clear to auscultation       Cardiovascular hypertension,  Rhythm:Regular Rate:Normal     Neuro/Psych    GI/Hepatic Neg liver ROS, hiatal hernia, GERD  ,  Endo/Other  Hyperthyroidism   Renal/GU negative Renal ROS     Musculoskeletal  (+) Arthritis , Fibromyalgia -  Abdominal   Peds  Hematology  (+) anemia ,   Anesthesia Other Findings   Reproductive/Obstetrics                             Anesthesia Physical Anesthesia Plan  ASA: III  Anesthesia Plan: General   Post-op Pain Management:    Induction: Intravenous  PONV Risk Score and Plan: 3 and Treatment may vary due to age or medical condition, Ondansetron and Midazolam  Airway Management Planned: Oral ETT  Additional Equipment:   Intra-op Plan:   Post-operative Plan: Possible Post-op intubation/ventilation  Informed Consent: I have reviewed the patients History and Physical, chart, labs and discussed the procedure including the risks, benefits and alternatives for the proposed anesthesia with the patient or authorized representative who has indicated his/her understanding and acceptance.   Dental advisory given  Plan Discussed with: CRNA and Anesthesiologist  Anesthesia Plan Comments:         Anesthesia Quick Evaluation

## 2017-10-30 NOTE — Anesthesia Procedure Notes (Signed)
Procedure Name: Intubation Date/Time: 10/30/2017 2:08 PM Performed by: Valda Favia, CRNA Pre-anesthesia Checklist: Patient identified, Emergency Drugs available, Suction available and Patient being monitored Patient Re-evaluated:Patient Re-evaluated prior to induction Oxygen Delivery Method: Circle System Utilized Preoxygenation: Pre-oxygenation with 100% oxygen Induction Type: IV induction Ventilation: Mask ventilation without difficulty Laryngoscope Size: Mac and 4 Grade View: Grade I Tube type: Oral Tube size: 7.0 mm Number of attempts: 1 Airway Equipment and Method: Stylet and Oral airway Placement Confirmation: ETT inserted through vocal cords under direct vision,  positive ETCO2 and breath sounds checked- equal and bilateral Secured at: 22 cm Tube secured with: Tape Dental Injury: Teeth and Oropharynx as per pre-operative assessment

## 2017-10-30 NOTE — Anesthesia Postprocedure Evaluation (Signed)
Anesthesia Post Note  Patient: [name]  Procedure(s) Performed: POSTERIOR LUMBAR INTERBODY FUSION LUMBAR FOUR- LUMBAR FIVE (N/A Back)     Patient location during evaluation: PACU Anesthesia Type: General Level of consciousness: sedated Pain management: pain level controlled Vital Signs Assessment: post-procedure vital signs reviewed and stable Respiratory status: spontaneous breathing and respiratory function stable Cardiovascular status: stable Postop Assessment: no apparent nausea or vomiting Anesthetic complications: no    Last Vitals:  Vitals:   10/30/17 1910 10/30/17 1926  BP: (!) 147/91 118/73  Pulse:  94  Resp: 20 16  Temp:    SpO2: 97% 99%    Last Pain:  Vitals:   10/30/17 1900  TempSrc:   PainSc: 5                  Kealani Leckey DANIEL

## 2017-10-30 NOTE — Transfer of Care (Signed)
Immediate Anesthesia Transfer of Care Note  Patient: [name]  Procedure(s) Performed: POSTERIOR LUMBAR INTERBODY FUSION LUMBAR FOUR- LUMBAR FIVE (N/A Back)  Patient Location: PACU  Anesthesia Type:General  Level of Consciousness: awake, alert , oriented and patient cooperative  Airway & Oxygen Therapy: Patient Spontanous Breathing and Patient connected to nasal cannula oxygen  Post-op Assessment: Report given to RN and Post -op Vital signs reviewed and stable  Post vital signs: Reviewed  Last Vitals: 163/104, 94, 22, 100% Vitals:   10/30/17 1047  BP: (!) 146/88  Pulse: 89  Resp: 20  Temp: 36.9 C  SpO2: 98%    Last Pain:  Vitals:   10/30/17 1047  TempSrc: Oral         Complications: No apparent anesthesia complications

## 2017-10-30 NOTE — H&P (Signed)
BP (!) 146/88   Pulse 89   Temp 98.4 F (36.9 C) (Oral)   Resp 20   Ht 5' 2.99" (1.6 m)   Wt 95.7 kg (210 lb 14.4 oz)   LMP 02/18/2016   SpO2 98%   BMI 37.37 kg/m  [name] is a former patient of mine whom I last saw in November 2014. At that time, she has been complaining of cervical pain. I did an MRI of the neck and it was normal. She was sent to Dr. Delbert HarnessMurphy Wainer who subsequently did do shoulder surgery on her on the left side in 2014. She has had a number of back surgery by me in 2012, a number of knee surgeries, shoulder surgeries, and ankle surgery since that time. She is 57 years of age, is disabled, is right handed, does not smoke. No history of alcohol use. No history of substance abuse. OBJECTIVE: VITAL SIGNS: She is 227 pounds, is 5 feet 2 inches. Blood pressure is 117/74, pulse is 69, temperature is 98.9. ALLERGIES: She says she is allergic to generic Celebrex and generic Voltaren gel. She has severe itching all over, in her words. MEDICATIONS: She takes Amitiza, Chlorzoxazone, Gabapentin, Atenolol, Celebrex, Oxycodone, Hydrochlorothiazide, Prilosec, Voltaren Gel, and ProAir. FAMILY HISTORY: Mother and father both deceased. PAST MEDICAL HISTORY: Her words she has back pain, hip, right leg pain goes down to the front of the legs started 9 months ago. She was out walking her dog. Her pain is 10/10. She also reports pain in the left side of the neck which is nonradiating. The back pain goes into both legs. She has burning at times. She says that her legs and ankles will swell. She does have headaches. Says the pain has gotten worse recently. She has had a loss of consciousness about a year ago while at church, falling. Her weight has been stable. REVIEW OF SYSTEMS: Problems with balance, problems with sinus, problems with swelling in the hands, feet, leg pain with walking, neck pain, back pain, leg pain, joint pain, food allergies, inhalant, thyroid disease, anemia, and  anxiety. PHYSICAL EXAMINATION: General: She is alert, oriented by 4, answering all questions appropriately. Memory, language, attention span, and fund of knowledge are normal. Speech is clear, it is also fluent. Tongue and uvula midline. She has 5/5 strength in her upper and lower extremities. Proprioception is intact. Romberg is negative. Gait is normal. Symmetric facies. Symmetric facial sensation. Hearing intact to voice.  She also in the lumbar spine has fairly severe stenosis anterolisthesis at the level above her anterior lumbar arthrodesis at 4-5. The disc space is essentially gone. This is a significant change since the last film that I was able to compare to in 2013. I do think this is the reason for the discomfort. I do believe that she would respond favorably to operative decompression.  She is admitted for lumbar decompression and arthrodesis at L4/5. Risks including but not limited to infection, bleeding, need for further surgery, no pain relief, nerve damage, lower extremity weakness, bowel and or bladder dysfunction, non union, hardware failure, and other risks were discussed. She understands and wishes to proceed.

## 2017-10-31 ENCOUNTER — Encounter (HOSPITAL_COMMUNITY): Payer: Self-pay

## 2017-10-31 ENCOUNTER — Other Ambulatory Visit: Payer: Self-pay

## 2017-10-31 MED ORDER — DIPHENHYDRAMINE HCL 12.5 MG/5ML PO ELIX
50.0000 mg | ORAL_SOLUTION | Freq: Three times a day (TID) | ORAL | Status: DC
  Administered 2017-10-31 – 2017-11-03 (×8): 50 mg via ORAL
  Filled 2017-10-31 (×10): qty 20

## 2017-10-31 NOTE — Progress Notes (Signed)
Foley was D/c @0630   Voided @ 0800  She has been up walking around the room all morning . Tolerated well.  Waiting for her back brace to be Sent up to room.

## 2017-10-31 NOTE — Evaluation (Signed)
Physical Therapy Evaluation Patient Details Name: [name] MRN: [id number] DOB: 1961-04-06 Today's Date: 10/31/2017   History of Present Illness  Pt is a 57 y.o. female s/p PLIF.   Clinical Impression  Patient is s/p above surgery resulting in the deficits listed below (see PT Problem List). PTA pt lived at home alone independent with mobility. On eval, she required min assist bed mobility, min guard assist transfers, and min guard assist ambulation 150 feet. Pt educated on 3/3 back precautions.  Patient will benefit from skilled PT to increase their independence and safety with mobility (while adhering to their precautions) to allow discharge to the venue listed below.     Follow Up Recommendations Home health PT;Supervision - Intermittent    Equipment Recommendations  None recommended by PT    Recommendations for Other Services       Precautions / Restrictions Precautions Precautions: Back Precaution Booklet Issued: Yes (comment) Precaution Comments: Educated pt on 3/3 back precautions. Handout provided.  Required Braces or Orthoses: Spinal Brace Restrictions Other Position/Activity Restrictions: Back brace not available at time of eval. Per ortho tech note, [employer] has been contacted to deliver brace.       Mobility  Bed Mobility Overal bed mobility: Needs Assistance Bed Mobility: Rolling;Sidelying to Sit Rolling: Min assist Sidelying to sit: Min assist       General bed mobility comments: +rail, cues for logroll, assist to elevate trunk  Transfers Overall transfer level: Needs assistance Equipment used: Rolling walker (2 wheeled) Transfers: Sit to/from Stand Sit to Stand: Min guard         General transfer comment: verbal cues for hand placement, increased time and effort  Ambulation/Gait Ambulation/Gait assistance: Min guard Ambulation Distance (Feet): 150 Feet Assistive device: Rolling walker (2 wheeled) Gait Pattern/deviations: Step-through  pattern;Decreased stride length Gait velocity: decreased Gait velocity interpretation: Below normal speed for age/gender General Gait Details: verbal cues for RW management. Pt tends to get too close to the front.   Stairs            Wheelchair Mobility    Modified Rankin (Stroke Patients Only)       Balance Overall balance assessment: No apparent balance deficits (not formally assessed)                                           Pertinent Vitals/Pain Pain Assessment: 0-10 Pain Score: 10-Worst pain ever Pain Location: sx site Pain Descriptors / Indicators: Burning Pain Intervention(s): Monitored during session;Premedicated before session    Home Living Family/patient expects to be discharged to:: Private residence Living Arrangements: Alone Available Help at Discharge: Family;Available PRN/intermittently Type of Home: House Home Access: Stairs to enter Entrance Stairs-Rails: Can reach both Entrance Stairs-Number of Steps: 5 Home Layout: One level Home Equipment: Crutches;Walker - 2 wheels      Prior Function Level of Independence: Independent               Hand Dominance   Dominant Hand: Right    Extremity/Trunk Assessment   Upper Extremity Assessment Upper Extremity Assessment: Defer to OT evaluation    Lower Extremity Assessment Lower Extremity Assessment: Overall WFL for tasks assessed    Cervical / Trunk Assessment Cervical / Trunk Assessment: Normal  Communication   Communication: No difficulties  Cognition Arousal/Alertness: Awake/alert Behavior During Therapy: WFL for tasks assessed/performed Overall Cognitive Status: Within Functional Limits for tasks  assessed                                        General Comments      Exercises     Assessment/Plan    PT Assessment Patient needs continued PT services  PT Problem List Decreased mobility;Decreased knowledge of precautions;Decreased activity  tolerance;Pain;Decreased knowledge of use of DME       PT Treatment Interventions DME instruction;Therapeutic activities;Gait training;Therapeutic exercise;Patient/family education    PT Goals (Current goals can be found in the Care Plan section)  Acute Rehab PT Goals Patient Stated Goal: decrease pain PT Goal Formulation: With patient Time For Goal Achievement: 11/14/17 Potential to Achieve Goals: Good    Frequency Min 5X/week   Barriers to discharge        Co-evaluation               AM-PAC PT "6 Clicks" Daily Activity  Outcome Measure Difficulty turning over in bed (including adjusting bedclothes, sheets and blankets)?: A Lot Difficulty moving from lying on back to sitting on the side of the bed? : A Lot Difficulty sitting down on and standing up from a chair with arms (e.g., wheelchair, bedside commode, etc,.)?: A Little Help needed moving to and from a bed to chair (including a wheelchair)?: A Little Help needed walking in hospital room?: A Little Help needed climbing 3-5 steps with a railing? : A Little 6 Click Score: 16    End of Session Equipment Utilized During Treatment: Gait belt Activity Tolerance: Patient tolerated treatment well Patient left: in chair;Other (comment);with call bell/phone within reach(with OT) Nurse Communication: Mobility status PT Visit Diagnosis: Pain;Other abnormalities of gait and mobility (R26.89)    Time: 1610-9604 PT Time Calculation (min) (ACUTE ONLY): 22 min   Charges:   PT Evaluation $PT Eval Moderate Complexity: 1 Mod     PT G Codes:        Aida Raider, PT  Office # [phone] Pager [phone]   Ilda Foil 10/31/2017, 11:39 AM

## 2017-10-31 NOTE — Progress Notes (Signed)
Orthopedic Tech Progress Note Patient Details:  Rush FarmerDeanna I Allmendinger October 25, 1960 [id number]  Patient ID: Rush Farmereanna I Koury, female   DOB: October 25, 1960, 57 y.o.   MRN: [id number]   Nikki DomCrawford, Simi Briel 10/31/2017, 9:43 AM Called in [employer] brace order; spoke with Wylene MenLacey

## 2017-10-31 NOTE — Evaluation (Signed)
Occupational Therapy Evaluation Patient Details Name: [name] MRN: [id number] DOB: August 06, 1961 Today's Date: 10/31/2017    History of Present Illness Pt is a 57 y.o. female s/p PLIF.    Clinical Impression   Pt with decline in function and safety with ADLs and ALD mobility with decreased balance and endurance. Pt limited by pain, however is motivated . Pt would benefit from acute OT services to address impairments to maximize level of function and safety to return home    Follow Up Recommendations  HH, DC plan and follow up therapy as arranged by surgeon;Supervision - Intermittent    Equipment Recommendations  Other (comment)(sock aid, LH sponge, shoe horn)    Recommendations for Other Services       Precautions / Restrictions Precautions Precautions: Back Precaution Booklet Issued: Yes (comment) Precaution Comments: pt able to recall 3/3 bacl precautions Required Braces or Orthoses: Spinal Brace Spinal Brace: Applied in sitting position Restrictions Weight Bearing Restrictions: No Other Position/Activity Restrictions: [employer] arrived with back brace during session      Mobility Bed Mobility Overal bed mobility: Needs Assistance Bed Mobility: Rolling;Sit to Sidelying Rolling: Min assist Sidelying to sit: Min assist     Sit to sidelying: Min assist General bed mobility comments: +rail, cues for logroll, assist with LEs onto bed  Transfers Overall transfer level: Needs assistance Equipment used: Rolling walker (2 wheeled) Transfers: Sit to/from Stand Sit to Stand: Min guard         General transfer comment: verbal cues for hand placement, increased time and effort    Balance Overall balance assessment: No apparent balance deficits (not formally assessed)                                         ADL either performed or assessed with clinical judgement   ADL Overall ADL's : Needs assistance/impaired Eating/Feeding:  Independent;Sitting   Grooming: Wash/dry hands;Wash/dry face;Standing;Set up;Supervision/safety   Upper Body Bathing: Set up;Sitting   Lower Body Bathing: Moderate assistance   Upper Body Dressing : Set up;Sitting   Lower Body Dressing: Moderate assistance   Toilet Transfer: RW;Ambulation;Min guard;Comfort height toilet;Grab bars   Toileting- Clothing Manipulation and Hygiene: Minimal assistance;Sit to/from stand   Tub/ Shower Transfer: Ambulation;Rolling walker;3 in 1;Grab bars   Functional mobility during ADLs: Min guard;Rolling walker General ADL Comments: pt with previou back suregry and L TKA, has ADL A/E at home. Reviewd A/E with pt, provided handout     Vision Baseline Vision/History: Wears glasses Wears Glasses: Reading only Patient Visual Report: No change from baseline       Perception     Praxis      Pertinent Vitals/Pain Pain Assessment: 0-10 Pain Score: 10-Worst pain ever Pain Location: sx site Pain Descriptors / Indicators: Burning Pain Intervention(s): Limited activity within patient's tolerance;Monitored during session;Premedicated before session;Repositioned     Hand Dominance Right   Extremity/Trunk Assessment Upper Extremity Assessment Upper Extremity Assessment: Overall WFL for tasks assessed   Lower Extremity Assessment Lower Extremity Assessment: Defer to PT evaluation   Cervical / Trunk Assessment Cervical / Trunk Assessment: Normal   Communication Communication Communication: No difficulties   Cognition Arousal/Alertness: Awake/alert Behavior During Therapy: WFL for tasks assessed/performed Overall Cognitive Status: Within Functional Limits for tasks assessed  General Comments       Exercises     Shoulder Instructions      Home Living Family/patient expects to be discharged to:: Private residence Living Arrangements: Alone Available Help at Discharge: Family;Available  PRN/intermittently Type of Home: House Home Access: Stairs to enter Enter[employer] CorporationEntrance Stairs-Number of Steps: 5 Entrance Stairs-Rails: Can reach both Home Layout: One level     Bathroom Shower/Tub: Tub/shower unit;Curtain   FirefighterBathroom Toilet: Standard     Home Equipment: Crutches;Walker - 2 wheels;Adaptive equipment Adaptive Equipment: Reacher        Prior Functioning/Environment Level of Independence: Independent        Comments: L TKA 4/13        OT Problem List: Decreased activity tolerance;Decreased knowledge of use of DME or AE;Impaired balance (sitting and/or standing);Pain      OT Treatment/Interventions: Self-care/ADL training;DME and/or AE instruction;Therapeutic activities;Patient/family education    OT Goals(Current goals can be found in the care plan section) Acute Rehab OT Goals Patient Stated Goal: decrease pain OT Goal Formulation: With patient Time For Goal Achievement: 11/14/17 Potential to Achieve Goals: Good ADL Goals Pt Will Perform Grooming: with set-up;standing Pt Will Perform Lower Body Bathing: with min assist;with caregiver independent in assisting;with adaptive equipment Pt Will Perform Lower Body Dressing: with min assist;with caregiver independent in assisting;with adaptive equipment Pt Will Transfer to Toilet: with supervision;ambulating Pt Will Perform Toileting - Clothing Manipulation and hygiene: with min guard assist;with supervision;sit to/from stand Pt Will Perform Tub/Shower Transfer: with supervision;ambulating;rolling walker;shower seat;3 in 1;with caregiver independent in assisting Additional ADL Goal #1: pt will demo correct log roll technique to get in and out of bed in prep for ADLs and ADL mobility  OT Frequency: Min 2X/week   Barriers to D/C:    no barriers       Co-evaluation              AM-PAC PT "6 Clicks" Daily Activity     Outcome Measure Help from another person eating meals?: None Help from another person taking  care of personal grooming?: None Help from another person toileting, which includes using toliet, bedpan, or urinal?: A Little Help from another person bathing (including washing, rinsing, drying)?: A Lot Help from another person to put on and taking off regular upper body clothing?: A Little Help from another person to put on and taking off regular lower body clothing?: A Lot 6 Click Score: 18   End of Session Equipment Utilized During Treatment: Rolling walker;Other (comment)(3 in 1)  Activity Tolerance: Patient limited by pain Patient left: in bed;with call bell/phone within reach  OT Visit Diagnosis: Unsteadiness on feet (R26.81);Pain Pain - Right/Left: (back) Pain - part of body: (back; incision site)                Time: [id number] OT Time Calculation (min): 27 min Charges:  OT General Charges $OT Visit: 1 Visit OT Evaluation $OT Eval Moderate Complexity: 1 Mod OT Treatments $Therapeutic Activity: 8-22 mins G-Codes: OT G-codes **NOT FOR INPATIENT CLASS** Functional Assessment Tool Used: AM-PAC 6 Clicks Daily Activity     Galen ManilaSpencer, Peretz Thieme Jeanette 10/31/2017, 2:38 PM

## 2017-10-31 NOTE — Care Management Note (Signed)
Case Management Note  Patient Details  Name: [name] MRN: [id number] Date of Birth: 05/15/1961  Subjective/Objective:     Pt s/p lumbar fusion. She is from home alone.               Action/Plan: PT recommending HH services. CM following for d/c needs, physician orders.    Expected Discharge Date:                  Expected Discharge Plan:  Home w Home Health Services  In-House Referral:     Discharge planning Services  CM Consult  Post Acute Care Choice:    Choice offered to:     DME Arranged:    DME Agency:     HH Arranged:    HH Agency:     Status of Service:     If discussed at [employer] Meetings, dates discussed:    Additional Comments:  Kermit BaloKelli F Nikkol Pai, RN 10/31/2017, 3:23 PM

## 2017-10-31 NOTE — Progress Notes (Signed)
Patient ID: [name], female   DOB: 1961/06/29, 57 y.o.   MRN: [id number] BP 123/66 (BP Location: Right Arm)   Pulse 97   Temp 98.4 F (36.9 C) (Oral)   Resp 18   Ht 5\' 3"  (1.6 m)   Wt 94.8 kg (209 lb)   LMP 02/18/2016   SpO2 97%   BMI 37.02 kg/m  Alert and oriented x 4 Moving all extremities well, legs are feeling better Wound is clean, dry, no signs of infection

## 2017-11-01 NOTE — Progress Notes (Signed)
Occupational Therapy Treatment Patient Details Name: [name] MRN: [id number] DOB: March 13, 1961 Today's Date: 11/01/2017    History of present illness Pt is a 57 y.o. female s/p PLIF.    OT comments  Pt progressing towards OT goals, and has met some of them. Today's session focused on AE education and practice with demo kit since she will be home alone. Pt with recall from previous surgeries and Pt provided with sock aide. Declined, or had other AE. Pt also requested to walk around the unit and was able to do so without DME. Pt did require min cues throughout for back precautions during functional activities. OT will continue to follow in the acute setting but it at an appropriate level to discharge from OT perspective at this time.   Follow Up Recommendations  No OT follow up;Supervision - Intermittent    Equipment Recommendations  None recommended by OT(Pt provided with sock aide)    Recommendations for Other Services      Precautions / Restrictions Precautions Precautions: Back Precaution Booklet Issued: Yes (comment) Precaution Comments: pt able to recall 3/3 bacl precautions Required Braces or Orthoses: Spinal Brace Spinal Brace: Applied in sitting position;Lumbar corset Restrictions Weight Bearing Restrictions: No       Mobility Bed Mobility               General bed mobility comments: Pt OOB in recliner when OT entered the room  Transfers Overall transfer level: Needs assistance Equipment used: None Transfers: Sit to/from Stand Sit to Stand: Supervision         General transfer comment: no LOB, good use of arm rests/grab bars during transfers    Balance Overall balance assessment: No apparent balance deficits (not formally assessed)                                         ADL either performed or assessed with clinical judgement   ADL Overall ADL's : Needs assistance/impaired     Grooming: Supervision/safety;Standing;Wash/dry  hands;Wash/dry face Grooming Details (indicate cue type and reason): vc for back precautions         Upper Body Dressing : Supervision/safety;Sitting Upper Body Dressing Details (indicate cue type and reason): to don brace Lower Body Dressing: Set up;With adaptive equipment;Sit to/from stand Lower Body Dressing Details (indicate cue type and reason): able to use sock aide, and grabber to don socks without physical assist from OT Toilet Transfer: Supervision/safety;Ambulation Toilet Transfer Details (indicate cue type and reason): good balance Toileting- Clothing Manipulation and Hygiene: Min guard;Sit to/from stand       Functional mobility during ADLs: Supervision/safety General ADL Comments: Pt provided with sock donner including teach back practice. Pt also given opportunity to practice with toilet aide (declined) long handle spong (she has one under her sink) and long handle shoe horn. She has a [employer] fast streamer. Eduation including teach back provided in full.      Vision       Perception     Praxis      Cognition Arousal/Alertness: Awake/alert Behavior During Therapy: WFL for tasks assessed/performed Overall Cognitive Status: Within Functional Limits for tasks assessed                                          Exercises  Shoulder Instructions       General Comments Pt is "independent" in the room and moves freely and easily. She is familiar with back precaution but required min cues to maintain throughout session. Pt is pleasant and     Pertinent Vitals/ Pain       Pain Assessment: 0-10 Pain Score: 5  Pain Location: sx site Pain Descriptors / Indicators: Discomfort;Sore;Tender Pain Intervention(s): Monitored during session;Repositioned  Home Living                                          Prior Functioning/Environment              Frequency  Min 2X/week        Progress Toward Goals  OT Goals(current goals can  now be found in the care plan section)  Progress towards OT goals: Progressing toward goals  Acute Rehab OT Goals Patient Stated Goal: decrease pain OT Goal Formulation: With patient Time For Goal Achievement: 11/14/17 Potential to Achieve Goals: Good  Plan Discharge plan remains appropriate;Frequency remains appropriate    Co-evaluation                 AM-PAC PT "6 Clicks" Daily Activity     Outcome Measure   Help from another person eating meals?: None Help from another person taking care of personal grooming?: None Help from another person toileting, which includes using toliet, bedpan, or urinal?: None Help from another person bathing (including washing, rinsing, drying)?: A Little Help from another person to put on and taking off regular upper body clothing?: None Help from another person to put on and taking off regular lower body clothing?: A Little 6 Click Score: 22    End of Session Equipment Utilized During Treatment: Back brace  OT Visit Diagnosis: Unsteadiness on feet (R26.81);Pain Pain - part of body: (back, sx site)   Activity Tolerance Patient tolerated treatment well   Patient Left in chair;with call bell/phone within reach   Nurse Communication Mobility status        Time: 3419-6222 OT Time Calculation (min): 40 min  Charges: OT General Charges $OT Visit: 1 Visit OT Treatments $Self Care/Home Management : 23-37 mins $Therapeutic Activity: 8-22 mins  Hulda Humphrey OTR/L [place] 11/01/2017, 3:54 PM

## 2017-11-01 NOTE — Progress Notes (Signed)
Patient has walked in the hall 3x times; w/o walker or cane. Patient tolerated well.

## 2017-11-01 NOTE — Progress Notes (Signed)
Patient states that she is allergic to generic celecoxib. RN called pharmacy. Pharmacy stated that we do not have brand name Celecoxib. Pharmacy stated patient can bring in her medicine from home and bring it to pharmacy; so that we can dispense it.

## 2017-11-01 NOTE — Progress Notes (Signed)
Physical Therapy Treatment Patient Details Name: Jill FarmerDeanna I [name] MRN: [id number] DOB: 18-Jan-1961 Today's Date: 11/01/2017    History of Present Illness Pt is a 57 y.o. female s/p PLIF.     PT Comments    Pt is making good progress towards her goals today, however continues to be limited in her mobility by back and sciatic pain. Pt currently supervision for bed mobility, transfers and ambulation of 250 feet without AD and is min guard for ascent/descent of 5 steps. Pt able to recall spinal precautions however requires min cueing to maintain precautions throughout session. D/c plans remain appropriate at this time. PT will continue to follow acutely until d/c.    Follow Up Recommendations  Home health PT;Supervision - Intermittent     Equipment Recommendations  None recommended by PT    Recommendations for Other Services       Precautions / Restrictions Precautions Precautions: Back Precaution Booklet Issued: Yes (comment) Precaution Comments: pt able to recall 3/3 bacl precautions Required Braces or Orthoses: Spinal Brace Spinal Brace: Applied in sitting position;Lumbar corset Restrictions Weight Bearing Restrictions: No    Mobility  Bed Mobility             Sit to sidelying: Supervision General bed mobility comments: seated EoB on entry  Transfers Overall transfer level: Needs assistance Equipment used: None Transfers: Sit to/from Stand Sit to Stand: Supervision         General transfer comment: no LOB, good power up  Ambulation/Gait Ambulation/Gait assistance: Supervision Ambulation Distance (Feet): 250 Feet Assistive device: None Gait Pattern/deviations: Step-through pattern;Decreased stride length Gait velocity: decreased Gait velocity interpretation: Below normal speed for age/gender General Gait Details: strong, steady gait, no LoB   Stairs Stairs: Yes   Stair Management: Two rails;Alternating pattern;Forwards Number of Stairs: 5 General  stair comments: slow but steady ascent/descent of 5 steps with no LoB    Balance Overall balance assessment: No apparent balance deficits (not formally assessed)                                          Cognition Arousal/Alertness: Awake/alert Behavior During Therapy: WFL for tasks assessed/performed Overall Cognitive Status: Within Functional Limits for tasks assessed                                           General Comments General comments (skin integrity, edema, etc.): Pt brace adjusted from XAL to XL to better fit pt. Pt able to restate spinal precautions however requires min cues to maintain      Pertinent Vitals/Pain Pain Assessment: 0-10 Pain Score: 5  Pain Location: sx site Pain Descriptors / Indicators: Discomfort;Sore;Tender Pain Intervention(s): Monitored during session;Limited activity within patient's tolerance;Repositioned           PT Goals (current goals can now be found in the care plan section) Acute Rehab PT Goals Patient Stated Goal: decrease pain PT Goal Formulation: With patient Time For Goal Achievement: 11/14/17 Potential to Achieve Goals: Good Progress towards PT goals: Progressing toward goals    Frequency    Min 5X/week      PT Plan Current plan remains appropriate       AM-PAC PT "6 Clicks" Daily Activity  Outcome Measure  Difficulty turning over in bed (including adjusting bedclothes, sheets  and blankets)?: A Lot Difficulty moving from lying on back to sitting on the side of the bed? : A Lot Difficulty sitting down on and standing up from a chair with arms (e.g., wheelchair, bedside commode, etc,.)?: A Little Help needed moving to and from a bed to chair (including a wheelchair)?: A Little Help needed walking in hospital room?: A Little Help needed climbing 3-5 steps with a railing? : A Little 6 Click Score: 16    End of Session Equipment Utilized During Treatment: Gait belt;Back  brace Activity Tolerance: Patient tolerated treatment well Patient left: in bed;with call bell/phone within reach Nurse Communication: Mobility status PT Visit Diagnosis: Pain;Other abnormalities of gait and mobility (R26.89) Pain - part of body: (back)     Time: 1610-9604 PT Time Calculation (min) (ACUTE ONLY): 18 min  Charges:  $Gait Training: 8-22 mins                    G Codes:       Merideth Bosque B. Beverely Risen PT, DPT Acute Rehabilitation  [phone] Pager [phone]     Elon Alas Fleet 11/01/2017, 5:01 PM

## 2017-11-01 NOTE — Progress Notes (Signed)
[facility] Awaiting HH orders and final dispo plan.

## 2017-11-01 NOTE — Progress Notes (Signed)
Neurosurgery Progress Note  No issues overnight. Pain adequately controlled.  Tolerating po.  EXAM:  BP (!) 85/53 (BP Location: Right Arm)   Pulse 86   Temp 98.3 F (36.8 C) (Oral)   Resp 18   Ht 5\' 3"  (1.6 m)   Wt 94.8 kg (209 lb)   LMP 02/18/2016   SpO2 96%   BMI 37.02 kg/m   Awake, alert, oriented  Speech fluent, appropriate  CN grossly intact  MAEW Incision c.d.i  PLAN Progressing nicely Continue current care Continue therapy

## 2017-11-02 MED ORDER — POLYETHYLENE GLYCOL 3350 17 G PO PACK
17.0000 g | PACK | Freq: Every day | ORAL | Status: DC
  Administered 2017-11-02 – 2017-11-04 (×3): 17 g via ORAL
  Filled 2017-11-02 (×3): qty 1

## 2017-11-02 NOTE — Progress Notes (Signed)
Neurosurgery Progress Note  No issues overnight.  Tolerating po. Ambulating in halls Does not feel ready to go home.  EXAM:  BP (!) 96/54 (BP Location: Right Arm) Comment: [id number]  Pulse 71   Temp 98.4 F (36.9 C) (Oral)   Resp 16   Ht 5\' 3"  (1.6 m)   Wt 94.8 kg (209 lb)   LMP 02/18/2016   SpO2 96%   BMI 37.02 kg/m   Awake, alert, oriented  Speech fluent, appropriate  CN grossly intact  MAEW with good strength Incision c/d/i  PLAN Progressing nicely Does not feel comfortable going home today Work with therapy today Hopeful discharge tomorrow

## 2017-11-03 MED ORDER — DIPHENHYDRAMINE HCL 25 MG PO CAPS
25.0000 mg | ORAL_CAPSULE | Freq: Three times a day (TID) | ORAL | Status: DC | PRN
  Administered 2017-11-03 – 2017-11-04 (×2): 25 mg via ORAL
  Filled 2017-11-03 (×2): qty 1

## 2017-11-03 NOTE — Progress Notes (Signed)
Patient ID: [name], female   DOB: 12-21-1960, 57 y.o.   MRN: [id number] BP (!) 147/65 (BP Location: Left Arm)   Pulse 72   Temp 98.4 F (36.9 C) (Oral)   Resp 20   Ht 5\' 3"  (1.6 m)   Wt 94.8 kg (209 lb)   LMP 02/18/2016   SpO2 100%   BMI 37.02 kg/m  Alert and oriented x 4, speech is clear and fluent Moving all extremities well Wound is clean, dry, no signs of infection Discharge tomorrow

## 2017-11-03 NOTE — Progress Notes (Signed)
Physical Therapy Treatment Patient Details Name: Jill FarmerDeanna I [name] MRN: [id number] DOB: 07-Oct-1961 Today's Date: 11/03/2017    History of Present Illness Pt is a 57 y.o. female s/p PLIF.     PT Comments    Pt progressing well. Continues to report 10/10 pain but asymptomatic during mobility. Pt is modified independent with all aspects of functional mobility, including ascend/descend 5 steps with rails.  No AD needed for ambulation.   Follow Up Recommendations  Home health PT;Supervision - Intermittent     Equipment Recommendations  None recommended by PT    Recommendations for Other Services       Precautions / Restrictions Precautions Precautions: Back Precaution Comments: pt able to recall 3/3 back precautions. Required Braces or Orthoses: Spinal Brace Spinal Brace: Applied in sitting position;Lumbar corset Restrictions Weight Bearing Restrictions: No    Mobility  Bed Mobility     Rolling: Modified independent (Device/Increase time) Sidelying to sit: Modified independent (Device/Increase time)     Sit to sidelying: Modified independent (Device/Increase time) General bed mobility comments: +rail, increased time and effort  Transfers   Equipment used: None Transfers: Sit to/from Stand Sit to Stand: Modified independent (Device/Increase time)         General transfer comment: increased time, safe technique  Ambulation/Gait Ambulation/Gait assistance: Modified independent (Device/Increase time) Ambulation Distance (Feet): 500 Feet Assistive device: None Gait Pattern/deviations: Step-through pattern;Decreased stride length Gait velocity: very slow Gait velocity interpretation: Below normal speed for age/gender General Gait Details: strong, steady gait, no LoB   Stairs     Stair Management: Two rails;Alternating pattern;Forwards Number of Stairs: 5 General stair comments: slow but steady  Wheelchair Mobility    Modified Rankin (Stroke Patients  Only)       Balance Overall balance assessment: No apparent balance deficits (not formally assessed)                                          Cognition Arousal/Alertness: Awake/alert Behavior During Therapy: WFL for tasks assessed/performed Overall Cognitive Status: Within Functional Limits for tasks assessed                                        Exercises      General Comments        Pertinent Vitals/Pain Pain Assessment: 0-10 Pain Score: 10-Worst pain ever Pain Location: sx site Pain Descriptors / Indicators: Sore Pain Intervention(s): Premedicated before session;Monitored during session;Repositioned    Home Living                      Prior Function            PT Goals (current goals can now be found in the care plan section) Acute Rehab PT Goals Patient Stated Goal: decrease pain PT Goal Formulation: With patient Time For Goal Achievement: 11/14/17 Potential to Achieve Goals: Good Progress towards PT goals: Progressing toward goals    Frequency    Min 5X/week      PT Plan Current plan remains appropriate    Co-evaluation              AM-PAC PT "6 Clicks" Daily Activity  Outcome Measure  Difficulty turning over in bed (including adjusting bedclothes, sheets and blankets)?: A Little Difficulty moving from lying on back  to sitting on the side of the bed? : A Little Difficulty sitting down on and standing up from a chair with arms (e.g., wheelchair, bedside commode, etc,.)?: A Little Help needed moving to and from a bed to chair (including a wheelchair)?: None Help needed walking in hospital room?: None Help needed climbing 3-5 steps with a railing? : None 6 Click Score: 21    End of Session Equipment Utilized During Treatment: Back brace Activity Tolerance: Patient tolerated treatment well Patient left: in chair;with call bell/phone within reach Nurse Communication: Mobility status PT Visit  Diagnosis: Pain;Other abnormalities of gait and mobility (R26.89)     Time: 9811-9147 PT Time Calculation (min) (ACUTE ONLY): 25 min  Charges:  $Gait Training: 23-37 mins                    G Codes:       Aida Raider, PT  Office # [phone] Pager [phone]    Ilda Foil 11/03/2017, 9:31 AM

## 2017-11-04 MED ORDER — CYCLOBENZAPRINE HCL 10 MG PO TABS: 10.0000 mg | ORAL_TABLET | Freq: Three times a day (TID) | ORAL | 0 refills | Status: DC | PRN

## 2017-11-04 MED ORDER — OXYCODONE-ACETAMINOPHEN 10-325 MG PO TABS: ORAL_TABLET | ORAL | 0 refills | Status: DC

## 2017-11-04 NOTE — Care Management Important Message (Signed)
Important Message  Patient Details  Name: Rush FarmerDeanna I Randal MRN: [id number] Date of Birth: [birth year]   Medicare Important Message Given:  Yes    Lawerance Sabalebbie Carling Liberman, RN 11/04/2017, 10:24 AM

## 2017-11-04 NOTE — Progress Notes (Addendum)
Patient was noted to be up and walking independently with brace on 3 times to the nurse's station through out the shift.  Patient advised that she feels that she is ready to go home and that is getting around a lot better today.

## 2017-11-04 NOTE — Care Management Note (Signed)
Case Management Note  Patient Details  Name: [name] MRN: [id number] Date of Birth: 07/08/1961  Subjective/Objective:                 Spoke with patient at bedside during end of PT session. Per PT patient has shown improvements. Patient states [facility]. No DME or other needs identified. Plan for DC to home today.    Action/Plan:   Expected Discharge Date:  11/04/17               Expected Discharge Plan:  Home/Self Care  In-House Referral:     Discharge planning Services  CM Consult  Post Acute Care Choice:    Choice offered to:  Patient  DME Arranged:    DME Agency:     HH Arranged:    HH Agency:     Status of Service:  Completed, signed off  If discussed at [employer] Stay Meetings, dates discussed:    Additional Comments:  Lawerance SabalDebbie Gillie Crisci, RN 11/04/2017, 10:23 AM

## 2017-11-04 NOTE — Discharge Summary (Signed)
Physician Discharge Summary  Patient ID: [name] MRN: [id number] DOB/AGE: January 03, 1961 57 y.o.  Admit date: 10/30/2017 Discharge date: 11/04/2017  Admission Diagnoses:spondylolisthesis lumbar  Discharge Diagnoses:  Active Problems:   Spondylolisthesis of lumbar region   Discharged Condition: good  Hospital Course: [name] was admitted and taken to the operating room for an uncomplicated lumbar decompression and fusion at L4/5. Post op she is voiding, ambulating, and tolerating a regular diet. Her wound is clean, dry, and without signs of infection.   Treatments: surgery: Procedure(s): L4, L5 laminectomy for decompression of the L4 , and L5 nerve roots well beyond the needed exposure for a PLIF POSTERIOR LUMBAR INTERBODY FUSION LUMBAR FOUR- LUMBAR FIVE With  Synthes Peek implants packed with autograft morsels Non segmental pedicle screw fixation L4/5 Nuvasive mas plif screws SURGEON:  Surgeon(s):    Discharge Exam: Blood pressure (!) 113/52, pulse 89, temperature 98.2 F (36.8 C), resp. rate 18, height 5\' 3"  (1.6 m), weight 94.8 kg (209 lb), last menstrual period 02/18/2016, SpO2 98 %. General appearance: alert, cooperative, appears stated age and no distress Neurologic: Alert and oriented X 3, normal strength and tone. Normal symmetric reflexes. Normal coordination and gait  Disposition: 01-Home or Self Care SPONDYLOLISTHESIS, LUMBAR REGION Discharge Instructions    Diet - low sodium heart healthy   Complete by:  As directed    Discharge instructions   Complete by:  As directed    Spinal Fusion Care After Refer to this sheet in the next few weeks. These instructions provide you with information on caring for yourself after your procedure. Your caregiver may also give you more specific instructions. Your treatment has been planned according to current medical practices, but problems sometimes occur. Call your caregiver if you have any problems or questions after your  procedure. HOME CARE INSTRUCTIONS  Take whatever pain medicine has been prescribed by your caregiver. Do not take over-the-counter pain medicine unless directed otherwise by your caregiver.  Do not drive if you are taking narcotic pain medicines.  Change your bandage (dressing) if necessary or as directed by your caregiver.  You may shower. The wound may get wet, simply pat the area dry. It will take ~2 weeks for the glue to peel off. If you have been prescribed medicine to prevent your blood from clotting, follow the directions carefully.  Check the area around your incision often. Look for redness and swelling. Also, look for anything leaking from your wound. You can use a mirror or have a family member inspect your incision if it is in a place where it is difficult for you to see.  Ask your caregiver what activities you should avoid and for how long.  Walk as much as possible.  Do not lift anything heavier than 5 lbs until your caregiver says it is safe.  Do not twist or bend for a few weeks. Try not to pull on things. Avoid sitting for long periods of time. Change positions at least every hour.   Discharge wound care:   Complete by:  As directed    You may shower. Pat incision dry   Driving Restrictions   Complete by:  As directed    No driving for one week.   Increase activity slowly   Complete by:  As directed    Lifting restrictions   Complete by:  As directed    No lifting greater than 5lbs   No dressing needed   Complete by:  As directed  Allergies as of 11/04/2017      Reactions   Monosodium Glutamate Anaphylaxis, Swelling   Eyes swollen shut, facial swelling, tongue swelling.   Shellfish Allergy Anaphylaxis   Celebrex [celecoxib] Itching   Only allergic to generic brand   Diclofenac Itching   Can take the name brand Voltaren   Latex Other (See Comments)   Itching/rash Latex glove with powder   Other Other (See Comments)   Pet dander, Mildew, Mold Cannot tolerate  generic DICLOFENAC GEL--MUST USE BRAND NAME (GENERIC CAUSES ITCHING)      Medication List    TAKE these medications   acetaminophen 500 MG tablet Commonly known as:  TYLENOL Take 1,500-2,000 mg by mouth every 8 (eight) hours as needed (for pain.).   albuterol 108 (90 Base) MCG/ACT inhaler Commonly known as:  PROVENTIL HFA;VENTOLIN HFA Inhale 2 puffs into the lungs every 6 (six) hours as needed for wheezing or shortness of breath.   AMITIZA 24 MCG capsule Generic drug:  lubiprostone Take 24 mcg by mouth 2 (two) times daily with a meal.   aspirin EC 81 MG tablet Take 81 mg by mouth daily.   atenolol 50 MG tablet Commonly known as:  TENORMIN Take 50 mg by mouth daily.   calcium-vitamin D 500-200 MG-UNIT tablet Commonly known as:  OSCAL WITH D Take 1 tablet by mouth 2 (two) times a week.   CELEBREX 200 MG capsule Generic drug:  celecoxib Take 200 mg by mouth 2 (two) times daily.   chlorzoxazone 500 MG tablet Commonly known as:  PARAFON Take 500 mg by mouth 3 (three) times daily.   cyclobenzaprine 10 MG tablet Commonly known as:  FLEXERIL Take 1 tablet (10 mg total) by mouth 3 (three) times daily as needed for muscle spasms. What changed:    when to take this  reasons to take this   diclofenac sodium 1 % Gel Commonly known as:  VOLTAREN Apply 1 application topically 2 (two) times daily as needed (shoulder, arm and leg pain). For right shoulder and arm and legs What changed:  additional instructions   diphenhydrAMINE 25 MG tablet Commonly known as:  BENADRYL Take 50 mg by mouth 3 (three) times daily.   gabapentin 600 MG tablet Commonly known as:  NEURONTIN Take 900 mg by mouth 3 (three) times daily.   hydrochlorothiazide 25 MG tablet Commonly known as:  HYDRODIURIL Take 1 tablet (25 mg total) by mouth daily. One tablet by mouth in the morning. What changed:  additional instructions   ibuprofen 200 MG tablet Commonly known as:  ADVIL,MOTRIN Take 800 mg by  mouth every 8 (eight) hours as needed (for pain.).   omeprazole 20 MG capsule Commonly known as:  PRILOSEC Take 20 mg by mouth 2 (two) times daily before a meal.   oxyCODONE-acetaminophen 10-325 MG tablet Commonly known as:  PERCOCET 1 tablet if needed for pain. What changed:  additional instructions   TYLENOL SINUS SEVERE 5-325-200 MG Tabs Generic drug:  Phenylephrine-APAP-Guaifenesin Take 2 tablets by mouth 3 (three) times daily. Tylenol Severe Sinus and Allergy            Discharge Care Instructions  (From admission, onward)        Start     Ordered   11/04/17 0000  Discharge wound care:    Comments:  You may shower. Pat incision dry   11/04/17 0930     Follow-up Information    Coletta Memosabbell, Taseen Marasigan, MD Follow up in 3 week(s).   Specialty:  Neurosurgery Why:  call to make an appointment please Contact information: [address] [name] [address] [phone]           Signed: Carmela Hurt 11/04/2017, 9:31 AM

## 2017-11-04 NOTE — Progress Notes (Signed)
Physical Therapy Treatment Patient Details Name: Jill FarmerDeanna I Yapp MRN: [id number] DOB: Aug 01, 1961 Today's Date: 11/04/2017    History of Present Illness Pt is a 57 y.o. female s/p PLIF.     PT Comments    Pt progressing very well. Pt functioning at mod I level. Pt able to state back precautions however non-compliant with them during function. Acute PT to follow.   Follow Up Recommendations  No PT follow up;Supervision - Intermittent     Equipment Recommendations  None recommended by PT    Recommendations for Other Services       Precautions / Restrictions Precautions Precautions: Back Precaution Booklet Issued: Yes (comment) Precaution Comments: pt able to recall with min v/c's Required Braces or Orthoses: Spinal Brace Spinal Brace: Applied in sitting position;Lumbar corset;Applied in standing position(pt walking around room not in brace) Restrictions Weight Bearing Restrictions: No    Mobility  Bed Mobility               General bed mobility comments: pt up in standing in room  Transfers Overall transfer level: Needs assistance Equipment used: None Transfers: Sit to/from Stand Sit to Stand: Modified independent (Device/Increase time)         General transfer comment: increased time, safe technique  Ambulation/Gait Ambulation/Gait assistance: Modified independent (Device/Increase time) Ambulation Distance (Feet): 400 Feet Assistive device: None Gait Pattern/deviations: Step-through pattern;Decreased stride length Gait velocity: very slow Gait velocity interpretation: Below normal speed for age/gender General Gait Details: strong, steady gait, no LoB   Stairs Stairs: Yes          Wheelchair Mobility    Modified Rankin (Stroke Patients Only)       Balance Overall balance assessment: No apparent balance deficits (not formally assessed)                                          Cognition Arousal/Alertness:  Awake/alert Behavior During Therapy: WFL for tasks assessed/performed Overall Cognitive Status: Within Functional Limits for tasks assessed                                 General Comments: however non-compliant with back brace in standing      Exercises      General Comments General comments (skin integrity, edema, etc.): pt able to don brace indep       Pertinent Vitals/Pain Pain Assessment: 0-10 Pain Score: 8  Pain Location: surgical site Pain Descriptors / Indicators: Sore Pain Intervention(s): Premedicated before session    Home Living                      Prior Function            PT Goals (current goals can now be found in the care plan section) Progress towards PT goals: Progressing toward goals    Frequency    Min 3X/week      PT Plan Frequency needs to be updated;Discharge plan needs to be updated    Co-evaluation              AM-PAC PT "6 Clicks" Daily Activity  Outcome Measure  Difficulty turning over in bed (including adjusting bedclothes, sheets and blankets)?: A Little Difficulty moving from lying on back to sitting on the side of the bed? : A Little Difficulty sitting down  on and standing up from a chair with arms (e.g., wheelchair, bedside commode, etc,.)?: A Little Help needed moving to and from a bed to chair (including a wheelchair)?: None Help needed walking in hospital room?: None Help needed climbing 3-5 steps with a railing? : A Little 6 Click Score: 20    End of Session Equipment Utilized During Treatment: Back brace Activity Tolerance: Patient tolerated treatment well Patient left: in chair;with call bell/phone within reach Nurse Communication: Mobility status PT Visit Diagnosis: Pain;Other abnormalities of gait and mobility (R26.89) Pain - Right/Left: (back) Pain - part of body: (back)     Time: 1610-9604 PT Time Calculation (min) (ACUTE ONLY): 21 min  Charges:  $Gait Training: 8-22 mins                     G Codes:       Lewis Shock, PT, DPT Pager #: [phone] Office #: [phone]    Nell Gales M Dacian Orrico 11/04/2017, 12:43 PM

## 2017-11-04 NOTE — Plan of Care (Signed)
Pt is a/o x4. Sr, VSS. Ra. Bilateral breath sounds clear. Bowel sounds present. No Bm. Voiding. Falls precautions maintained. SCDs for VTE but refusing. Independently ambulatory. Treating pain with scheduled + PRN meds. Able to turn self. Plan to d/c home today. Will continue to monitor.

## 2018-03-12 ENCOUNTER — Other Ambulatory Visit: Payer: Self-pay | Admitting: Family Medicine

## 2018-03-12 DIAGNOSIS — R921 Mammographic calcification found on diagnostic imaging of breast: Secondary | ICD-10-CM

## 2018-04-16 ENCOUNTER — Ambulatory Visit
Admission: RE | Admit: 2018-04-16 | Discharge: 2018-04-16 | Disposition: A | Discharge status: Home / Self Care | Payer: Medicare Other | Source: Ambulatory Visit | Attending: Family Medicine | Admitting: Family Medicine

## 2018-04-16 DIAGNOSIS — R921 Mammographic calcification found on diagnostic imaging of breast: Secondary | ICD-10-CM

## 2018-08-19 ENCOUNTER — Other Ambulatory Visit: Payer: Self-pay | Admitting: Neurosurgery

## 2018-08-19 DIAGNOSIS — M47812 Spondylosis without myelopathy or radiculopathy, cervical region: Secondary | ICD-10-CM

## 2018-08-24 ENCOUNTER — Ambulatory Visit
Admission: RE | Admit: 2018-08-24 | Discharge: 2018-08-24 | Disposition: A | Discharge status: Home / Self Care | Payer: Medicare Other | Source: Ambulatory Visit | Attending: Neurosurgery | Admitting: Neurosurgery

## 2018-08-24 DIAGNOSIS — M47812 Spondylosis without myelopathy or radiculopathy, cervical region: Secondary | ICD-10-CM

## 2018-11-23 ENCOUNTER — Other Ambulatory Visit: Payer: Self-pay | Admitting: Physical Medicine and Rehabilitation

## 2018-11-23 DIAGNOSIS — M5412 Radiculopathy, cervical region: Secondary | ICD-10-CM

## 2018-11-27 ENCOUNTER — Other Ambulatory Visit: Payer: Self-pay | Admitting: Physical Medicine and Rehabilitation

## 2018-12-01 ENCOUNTER — Ambulatory Visit
Admission: RE | Admit: 2018-12-01 | Discharge: 2018-12-01 | Disposition: A | Discharge status: Home / Self Care | Payer: Medicare Other | Source: Ambulatory Visit | Attending: Physical Medicine and Rehabilitation | Admitting: Physical Medicine and Rehabilitation

## 2018-12-01 DIAGNOSIS — M5412 Radiculopathy, cervical region: Secondary | ICD-10-CM

## 2018-12-09 ENCOUNTER — Other Ambulatory Visit: Payer: Self-pay | Admitting: Physical Medicine and Rehabilitation

## 2018-12-09 DIAGNOSIS — R51 Headache: Principal | ICD-10-CM

## 2018-12-09 DIAGNOSIS — R519 Headache, unspecified: Secondary | ICD-10-CM

## 2018-12-11 ENCOUNTER — Other Ambulatory Visit: Payer: Self-pay | Admitting: Physical Medicine and Rehabilitation

## 2018-12-11 DIAGNOSIS — R51 Headache: Principal | ICD-10-CM

## 2018-12-11 DIAGNOSIS — R519 Headache, unspecified: Secondary | ICD-10-CM

## 2018-12-14 ENCOUNTER — Other Ambulatory Visit: Payer: Self-pay | Admitting: Physical Medicine and Rehabilitation

## 2018-12-14 DIAGNOSIS — R51 Headache: Principal | ICD-10-CM

## 2018-12-14 DIAGNOSIS — R519 Headache, unspecified: Secondary | ICD-10-CM

## 2018-12-15 ENCOUNTER — Ambulatory Visit
Admission: RE | Admit: 2018-12-15 | Discharge: 2018-12-15 | Disposition: A | Discharge status: Home / Self Care | Payer: Medicare Other | Source: Ambulatory Visit | Attending: Physical Medicine and Rehabilitation | Admitting: Physical Medicine and Rehabilitation

## 2018-12-15 DIAGNOSIS — R51 Headache: Principal | ICD-10-CM

## 2018-12-15 DIAGNOSIS — R519 Headache, unspecified: Secondary | ICD-10-CM

## 2019-02-24 ENCOUNTER — Encounter (HOSPITAL_COMMUNITY): Payer: Self-pay

## 2019-02-24 ENCOUNTER — Other Ambulatory Visit: Payer: Self-pay

## 2019-02-24 ENCOUNTER — Emergency Department (HOSPITAL_COMMUNITY)
Admission: EM | Admit: 2019-02-24 | Discharge: 2019-02-24 | Disposition: A | Discharge status: Home / Self Care | Payer: Medicare Other | Attending: Emergency Medicine | Admitting: Emergency Medicine

## 2019-02-24 DIAGNOSIS — Z79899 Other long term (current) drug therapy: Secondary | ICD-10-CM | POA: Insufficient documentation

## 2019-02-24 DIAGNOSIS — R531 Weakness: Secondary | ICD-10-CM | POA: Diagnosis not present

## 2019-02-24 DIAGNOSIS — Z96653 Presence of artificial knee joint, bilateral: Secondary | ICD-10-CM | POA: Diagnosis not present

## 2019-02-24 DIAGNOSIS — M542 Cervicalgia: Secondary | ICD-10-CM | POA: Diagnosis not present

## 2019-02-24 DIAGNOSIS — I10 Essential (primary) hypertension: Secondary | ICD-10-CM | POA: Insufficient documentation

## 2019-02-24 DIAGNOSIS — J45909 Unspecified asthma, uncomplicated: Secondary | ICD-10-CM | POA: Insufficient documentation

## 2019-02-24 DIAGNOSIS — Z87891 Personal history of nicotine dependence: Secondary | ICD-10-CM | POA: Diagnosis not present

## 2019-02-24 DIAGNOSIS — Z7982 Long term (current) use of aspirin: Secondary | ICD-10-CM | POA: Diagnosis not present

## 2019-02-24 DIAGNOSIS — R200 Anesthesia of skin: Secondary | ICD-10-CM | POA: Diagnosis present

## 2019-02-24 DIAGNOSIS — Z91040 Latex allergy status: Secondary | ICD-10-CM | POA: Diagnosis not present

## 2019-02-24 MED ORDER — TIZANIDINE HCL 4 MG PO TABS: 4.0000 mg | ORAL_TABLET | Freq: Four times a day (QID) | ORAL | 0 refills | Status: DC | PRN

## 2019-02-24 MED ORDER — KETOROLAC TROMETHAMINE 30 MG/ML IJ SOLN
30.0000 mg | Freq: Once | INTRAMUSCULAR | Status: AC
Start: 2019-02-24 — End: 2019-02-24
  Administered 2019-02-24: 30 mg via INTRAMUSCULAR
  Filled 2019-02-24: qty 1

## 2019-02-24 NOTE — Discharge Instructions (Addendum)
Please read attached information. If you experience any new or worsening signs or symptoms please return to the emergency room for evaluation. Please follow-up with your primary care provider or specialist as discussed. Please use medication prescribed only as directed and discontinue taking if you have any concerning signs or symptoms.   °

## 2019-02-24 NOTE — ED Notes (Signed)
Patient verbalizes understanding of discharge instructions. Opportunity for questioning and answering were provided. , patient discharged from ED. Family member here to pick patient up

## 2019-02-24 NOTE — ED Triage Notes (Addendum)
Pt c/o " my legs giving out " pt  Also c/o leg pain shooting from the bottom of the feet radiating to the top of the legs ; pt also reports difficulty walking these last couple of weeks due to the pain

## 2019-02-24 NOTE — ED Provider Notes (Signed)
[facility] EMERGENCY DEPARTMENT Provider Note   CSN: [id number] Arrival date & time: 02/24/19  1022    History   Chief Complaint Chief Complaint  Patient presents with  . Leg Pain    HPI [name] is a 58 y.o. female.     HPI   58 year old female presents today with complaints of neck pain and weakness.  Patient notes a longstanding history of worsening upper and lower extremity weakness and decreased sensation.  She notes she is currently followed by neurosurgeon Dr. Franky Macho.  She notes progressive numbness in the upper and lower extremities with complete numbness in the fingertips bilateral.  She notes she has difficulty holding objects as sometimes they fall out of her hands.  She notes occasional muscle spasms in her hands.  She notes that this has been progressive with no severe changes.  She notes this morning her legs gave out on her and she had difficulty getting to her feet.  She was able to call her nephew who came to her house and helped walker to a car into the emergency room.  She notes she has been attempting to get a hold of neurosurgery, she reports they have been calling her back but she has missed to their calls secondary to them going straight to her voicemail.  She notes that she takes oxycodone daily, was taking Zanaflex but has run out of her prescription.  No bowel or bladder complaints.    Past Medical History:  Diagnosis Date  . Anemia   . Anemia   . Ankle syndesmosis disruption   . Anxiety    takes Ativan and Valium, after mother passed  . Arthritis    bilateral knees s/p knee replacement bilaterally  . Asthma    but pt states not severe enough to even have an inhaler  . Bronchitis   . Bruising    pt states unexplained d/t fibromyalgia  . Chronic back pain    2012 tailbone surgery and 3 lower discs.   . Closed fracture of distal end of right fibula and tibia   . Depression    from Fibromyalgia diagnosis; not taking  medicine. since 2001  . Dizziness    rarely  . Fibromyalgia    diagnosed 2001  . GERD (gastroesophageal reflux disease)    Prilosec occasionally  . Headache(784.0)    "sinus headaches"  . History of hiatal hernia   . Hypertension    since 2013  . Hyperthyroidism    subclinical, no treatment; thyroid nodules  . IBS (irritable bowel syndrome)   . Impaired memory    states from fibromyalgia  . Insomnia    takes Ambien  . Jones fracture    left foot fifth metatarsal  . Multiple allergies    including latex, pet dander, shellfish, pet dander  . Painful orthopaedic hardware right ankle 05/27/2016  . Seasonal allergies   . Shortness of breath    with exertion;pt states that its related to her weight  . Sore gums    this is why pt is on Amoxil-only takes for dental work  . Tachycardia   . Thyroid goiter   . Varicose vein    protrudes above skin-per pt;vein popped and bruised;ultrasound done to make sure that there were no clots;noclots were found    Patient Active Problem List   Diagnosis Date Noted  . Spondylolisthesis of lumbar region 10/30/2017  . Painful orthopaedic hardware right ankle 05/27/2016  . Closed fracture of distal  end of right fibula and tibia   . Ankle syndesmosis disruption   . ASCUS favor benign 01/06/2014  . Leg swelling 12/22/2013  . Seasonal allergies   . Anemia   . Chronic back pain due to DJD with history surgery   . Hyperthyroidism   . 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 Status post left total prosthetic replacement of knee joint using cement 03/24/2012  . Fibromyalgia   . GERD (gastroesophageal reflux disease)   . Anxiety   . Depression   . Hypertension   . Lumbar degenerative disc disease 09/25/2011    Past Surgical History:  Procedure Laterality Date  . ANTERIOR LUMBAR FUSION  09/20/2011   Procedure: ANTERIOR LUMBAR FUSION 1 LEVEL;  Surgeon: Carmela Hurt;  Location: [facility];  Service:  Neurosurgery;  Laterality: N/A;  Lumbar five-Sacral One Anterior Lumbar Interbody Fusion /Dr. Early to Approach   . CERVICAL DISC SURGERY     WITH TITANIUM PLATE IN NECK---LEFT SIDE  . fibroidectomy    . FRACTURE SURGERY  05/08/2010   Jones fracture left foot fifth metatarsal  . HARDWARE REMOVAL Right 05/28/2016   Procedure: RIGHT ANKLE HARDWARE REMOVAL;  Surgeon: Salvatore Marvel, MD;  Location: [facility];  Service: Orthopedics;  Laterality: Right;  . IRRIGATION AND DEBRIDEMENT KNEE  04/09/2012   Procedure: IRRIGATION AND DEBRIDEMENT KNEE;  Surgeon: Thera Flake., MD;  Location: [facility] OR;  Service: Orthopedics;  Laterality: Right;  . JOINT REPLACEMENT  01-27-2012   left total knee  . KNEE SURGERY  2005   Left knee arthroscopy  . NASAL SEPTOPLASTY W/ TURBINOPLASTY  2007   due to recurrent sinusitis  . ORIF ANKLE FRACTURE Right 06/21/2015   Procedure: OPEN REDUCTION INTERNAL FIXATION RIGHT DISTAL FIBULA  FRACTURE AND OPEN REDUCTION INTERNAL FIXATION SYNDESMOSIS ;  Surgeon: Salvatore Marvel, MD;  Location:  SURGERY CENTER;  Service: Orthopedics;  Laterality: Right;  . SPINE SURGERY  2004   Cervical plate, ACDF  . STERIOD INJECTION  01/27/2012   Procedure: STEROID INJECTION;  Surgeon: Nilda Simmer, MD;  Location: [facility] OR;  Service: Orthopedics;  Laterality: Right;  . TONSILLECTOMY  2007  . TOTAL KNEE ARTHROPLASTY  01/27/2012   Procedure: TOTAL KNEE ARTHROPLASTY;  Surgeon: Nilda Simmer, MD;  Bilateral  . TOTAL KNEE ARTHROPLASTY  04/06/2012   Procedure: TOTAL KNEE ARTHROPLASTY;  Surgeon: Nilda Simmer, MD;  Location: [facility] OR;  Service: Orthopedics;  Laterality: Right;  . TUBAL LIGATION    . UTERINE FIBROID SURGERY     mid 200s     OB History   No obstetric history on file.      Home Medications    Prior to Admission medications   Medication Sig Start Date End Date Taking? Authorizing Provider  acetaminophen (TYLENOL) 500 MG tablet Take 1,500-2,000 mg by mouth every 8  (eight) hours as needed (for pain.).     [provider]  albuterol (PROVENTIL HFA;VENTOLIN HFA) 108 (90 BASE) MCG/ACT inhaler Inhale 2 puffs into the lungs every 6 (six) hours as needed for wheezing or shortness of breath. 01/12/14   Shelva Majestic, MD  AMITIZA 24 MCG capsule Take 24 mcg by mouth 2 (two) times daily with a meal.  11/29/13   [provider]  aspirin EC 81 MG tablet Take 81 mg by mouth daily.    [provider]  atenolol (TENORMIN) 50 MG tablet Take 50 mg by mouth daily.  [provider]  calcium-vitamin D (OSCAL WITH D) 500-200 MG-UNIT per tablet Take 1 tablet by mouth 2 (two) times a week.     [provider]  CELEBREX 200 MG capsule Take 200 mg by mouth 2 (two) times daily.    [provider]  chlorzoxazone (PARAFON) 500 MG tablet Take 500 mg by mouth 3 (three) times daily.    [provider]  cyclobenzaprine (FLEXERIL) 10 MG tablet Take 1 tablet (10 mg total) by mouth 3 (three) times daily as needed for muscle spasms. 11/04/17   Coletta Memos, MD  diclofenac sodium (VOLTAREN) 1 % GEL Apply 1 application topically 2 (two) times daily as needed (shoulder, arm and leg pain). For right shoulder and arm and legs Patient taking differently: Apply 1 application topically 2 (two) times daily as needed (shoulder, arm and leg pain). BRAND NAME ONLY 01/12/14   Shelva Majestic, MD  diphenhydrAMINE (BENADRYL) 25 MG tablet Take 50 mg by mouth 3 (three) times daily.    [provider]  gabapentin (NEURONTIN) 600 MG tablet Take 900 mg by mouth 3 (three) times daily. 10/17/17   [provider]  hydrochlorothiazide (HYDRODIURIL) 25 MG tablet Take 1 tablet (25 mg total) by mouth daily. One tablet by mouth in the morning. Patient taking differently: Take 25 mg by mouth daily.  01/04/14   Shelva Majestic, MD  ibuprofen (ADVIL,MOTRIN) 200 MG tablet Take 800 mg by mouth every 8 (eight) hours as needed (for pain.).     [provider]  omeprazole (PRILOSEC) 20 MG capsule Take 20 mg by mouth 2 (two) times daily before a meal.    [provider]  oxyCODONE-acetaminophen (PERCOCET) 10-325 MG tablet 1 tablet if needed for pain. 11/04/17   Coletta Memos, MD  Phenylephrine-APAP-Guaifenesin (TYLENOL SINUS SEVERE) 5-325-200 MG TABS Take 2 tablets by mouth 3 (three) times daily. Tylenol Severe Sinus and Allergy    [provider]  tiZANidine (ZANAFLEX) 4 MG tablet Take 1 tablet (4 mg total) by mouth every 6 (six) hours as needed for muscle spasms. 02/24/19   Eyvonne Mechanic, PA-C    Family History Family History  Problem Relation Age of Onset  . Stroke Father        passed 2004 from pneumonia  . Alcohol abuse Father   . Pulmonary embolism Mother        after minor knee surgery leading to DVT  . Arthritis Sister   . Depression Sister   . Hypertension Sister   . Diabetes Other        grandmother  . Anesthesia problems Neg Hx   . Hypotension Neg Hx   . Malignant hyperthermia Neg Hx   . Pseudochol deficiency Neg Hx     Social History Social History   Tobacco Use  . Smoking status: Former Smoker    Packs/day: 0.50    Years: 10.00    Pack years: 5.00    Last attempt to quit: 12/06/1998    Years since quitting: 20.2  . Smokeless tobacco: Never Used  Substance Use Topics  . Alcohol use: No  . Drug use: No     Allergies   Monosodium glutamate; Shellfish allergy; Celebrex [celecoxib]; Diclofenac; Latex; and Other   Review of Systems Review of Systems  All other systems reviewed and are negative.    Physical Exam Updated Vital Signs BP (!) 155/87   Pulse 71   Temp 98.2 F (36.8 C) (Oral)   Resp 18  LMP 02/18/2016   SpO2 99%   Physical Exam Vitals signs and nursing note reviewed.  Constitutional:      Appearance: She is well-developed.  HENT:     Head: Normocephalic and atraumatic.  Eyes:     General: No scleral icterus.       Right eye: No discharge.         Left eye: No discharge.     Conjunctiva/sclera: Conjunctivae normal.     Pupils: Pupils are equal, round, and reactive to light.  Neck:     Musculoskeletal: Normal range of motion.     Vascular: No JVD.     Trachea: No tracheal deviation.  Pulmonary:     Effort: Pulmonary effort is normal.     Breath sounds: No stridor.  Musculoskeletal:     Comments: No CT or L-spine tenderness to palpation, generalized tenderness to palpation of the cervical region-upper extremity strength 5 out of 5, perfusion intact-sensation slightly decreased diffusely to the bilateral upper extremities, no sensation in the fingertips bilateral-lower strength weaker compared to upper but still 5 out of 5 she is able to go from sitting to standing position, patellar reflexes 2+, sensation intact to the lower extremities  Neurological:     Mental Status: She is alert and oriented to person, place, and time.     Coordination: Coordination normal.  Psychiatric:        Behavior: Behavior normal.        Thought Content: Thought content normal.        Judgment: Judgment normal.      ED Treatments / Results  Labs (all labs ordered are listed, but only abnormal results are displayed) Labs Reviewed - No data to display  EKG None  Radiology No results found.  Procedures Procedures (including critical care time)  Medications Ordered in ED Medications  ketorolac (TORADOL) 30 MG/ML injection 30 mg (30 mg Intramuscular Given 02/24/19 1106)     Initial Impression / Assessment and Plan / ED Course  I have reviewed the triage vital signs and the nursing notes.  Pertinent labs & imaging results that were available during my care of the patient were reviewed by me and considered in my medical decision making (see chart for details).        58 year old female presents today status post fall.  She has no trauma from the fall.  She has chronic radiculopathy.  She has been told she needs surgery and is currently  talking with neurosurgery about this.  She has no acute changes to her baseline, she has recent MRI studies no need for further imaging at this time.  Patient will be given a refill of her muscle relaxer, given Toradol here as she has no contraindications encouraged to follow-up closely with neurosurgery.  She will return immediately if she develops any new or worsening signs or symptoms.  Patient is happy and in agreement with today's plan had no further questions or concerns at the time of discharge.  Final Clinical Impressions(s) / ED Diagnoses   Final diagnoses:  Neck pain  Weakness    ED Discharge Orders         Ordered    tiZANidine (ZANAFLEX) 4 MG tablet  Every 6 hours PRN     02/24/19 1103           Eyvonne MechanicHedges, Kirstie Larsen, PA-C 02/24/19 1126    Benjiman CorePickering, Nathan, MD 02/24/19 1534

## 2019-03-08 ENCOUNTER — Other Ambulatory Visit: Payer: Self-pay | Admitting: Neurosurgery

## 2019-03-08 DIAGNOSIS — M4316 Spondylolisthesis, lumbar region: Secondary | ICD-10-CM

## 2019-03-08 DIAGNOSIS — M4712 Other spondylosis with myelopathy, cervical region: Secondary | ICD-10-CM

## 2019-03-10 ENCOUNTER — Other Ambulatory Visit: Payer: Self-pay

## 2019-03-10 ENCOUNTER — Ambulatory Visit
Admission: RE | Admit: 2019-03-10 | Discharge: 2019-03-10 | Disposition: A | Discharge status: Home / Self Care | Payer: Medicare Other | Source: Ambulatory Visit | Attending: Neurosurgery | Admitting: Neurosurgery

## 2019-03-10 ENCOUNTER — Other Ambulatory Visit: Payer: Self-pay | Admitting: Neurosurgery

## 2019-03-10 DIAGNOSIS — M4712 Other spondylosis with myelopathy, cervical region: Secondary | ICD-10-CM

## 2019-03-10 DIAGNOSIS — M4316 Spondylolisthesis, lumbar region: Secondary | ICD-10-CM

## 2019-03-24 HISTORY — PX: CERVICAL SPINE SURGERY: SHX589

## 2019-03-26 ENCOUNTER — Other Ambulatory Visit (HOSPITAL_COMMUNITY): Payer: Self-pay | Admitting: Neurosurgery

## 2019-03-26 ENCOUNTER — Other Ambulatory Visit: Payer: Self-pay

## 2019-03-26 ENCOUNTER — Ambulatory Visit (HOSPITAL_COMMUNITY)
Admission: RE | Admit: 2019-03-26 | Discharge: 2019-03-26 | Disposition: A | Discharge status: Home / Self Care | Payer: Medicare Other | Source: Ambulatory Visit | Attending: Neurosurgery | Admitting: Neurosurgery

## 2019-03-26 DIAGNOSIS — R609 Edema, unspecified: Secondary | ICD-10-CM

## 2019-03-26 NOTE — Progress Notes (Signed)
LE venous duplex       has been completed. Preliminary results can be found under CV proc through chart review. June Leap, BS, RDMS, RVT    Called results to [place].

## 2019-06-02 ENCOUNTER — Other Ambulatory Visit: Payer: Self-pay | Admitting: Neurosurgery

## 2019-06-03 ENCOUNTER — Other Ambulatory Visit: Payer: Self-pay | Admitting: Neurosurgery

## 2019-06-03 DIAGNOSIS — M4316 Spondylolisthesis, lumbar region: Secondary | ICD-10-CM

## 2019-06-11 ENCOUNTER — Ambulatory Visit
Admission: RE | Admit: 2019-06-11 | Discharge: 2019-06-11 | Disposition: A | Discharge status: Home / Self Care | Payer: Medicare Other | Source: Ambulatory Visit | Attending: Neurosurgery | Admitting: Neurosurgery

## 2019-06-11 ENCOUNTER — Other Ambulatory Visit: Payer: Self-pay

## 2019-06-11 DIAGNOSIS — M4316 Spondylolisthesis, lumbar region: Secondary | ICD-10-CM

## 2019-06-15 ENCOUNTER — Other Ambulatory Visit: Payer: Self-pay | Admitting: Neurosurgery

## 2019-06-30 ENCOUNTER — Other Ambulatory Visit: Payer: Self-pay | Admitting: Family Medicine

## 2019-06-30 DIAGNOSIS — E041 Nontoxic single thyroid nodule: Secondary | ICD-10-CM

## 2019-07-06 ENCOUNTER — Ambulatory Visit
Admission: RE | Admit: 2019-07-06 | Discharge: 2019-07-06 | Disposition: A | Discharge status: Home / Self Care | Payer: Medicare Other | Source: Ambulatory Visit | Attending: Family Medicine | Admitting: Family Medicine

## 2019-07-06 DIAGNOSIS — E041 Nontoxic single thyroid nodule: Secondary | ICD-10-CM

## 2019-07-07 NOTE — Progress Notes (Addendum)
[facility] [address] [name] - [address] [name] [address] Phone: [phone] Fax: [phone]  [facility] - [place], [name] - [address] [name] [address] Phone: [phone] Fax: [phone]      Your procedure is scheduled on Wednesday 07/14/2019.  Report to [facility] Main Entrance "A" at 07:00 A.M., and check in at the Admitting office.  Call this number if you have problems the morning of surgery:  [phone]  Call [phone] if you have any questions prior to your surgery date Monday-Friday 8am-4pm    Remember:  Do not eat or drink after midnight the night before your surgery     Take these medicines the morning of surgery with A SIP OF WATER:  Atenolol (Tenormin) Chlorzoxazone (Parafon) Diazepam (Valium) Gabapentin (Neurontin) Hydroxyzine (Atarax/vistaril) Methocarbamol (Robaxin) Omeprazole (Prilosec)  Albuterol (Proventil; Ventolin) inhaler - if needed  --> Please bring all inhalers with you the day of surgery.  Cyclobenzaprine (flexeril) - if needed Dipenhydramine (Benadryl) - if needed Ondansetron (Zofran-ODT) - if needed Oxycodone-acetaminophen (Percocet) - if needed  7 days prior to surgery STOP taking any Celebrex, Diclofenac (Voltaren) gel, Aspirin (unless otherwise instructed by your surgeon), Aleve, Naproxen, Ibuprofen, Motrin, Advil, Goody's, BC's, all herbal medications, fish oil, and all vitamins.    The Morning of Surgery  Do not wear jewelry, make-up or nail polish.  Do not wear lotions, powders, perfumes, or deodorant  Do not shave 48 hours prior to surgery.    Do not bring valuables to the hospital.  [facility] is not responsible for any belongings or valuables.  If you are a smoker, DO NOT Smoke 24 hours prior to surgery  If you wear a CPAP at night please bring your mask, tubing, and machine the morning of surgery   Remember that you must have someone to transport  you home after your surgery, and remain with you for 24 hours if you are discharged the same day.   Contacts, glasses, hearing aids, dentures or bridgework may not be worn into surgery.    Leave your suitcase in the car.  After surgery it may be brought to your room.  For patients admitted to the hospital, discharge time will be determined by your treatment team.  Patients discharged the day of surgery will not be allowed to drive home.    Special instructions:   [facility]- Preparing For Surgery  Before surgery, you can play an important role. Because skin is not sterile, your skin needs to be as free of germs as possible. You can reduce the number of germs on your skin by washing with CHG (chlorahexidine gluconate) Soap before surgery.  CHG is an antiseptic cleaner which kills germs and bonds with the skin to continue killing germs even after washing.    Oral Hygiene is also important to reduce your risk of infection.  Remember - BRUSH YOUR TEETH THE MORNING OF SURGERY WITH YOUR REGULAR TOOTHPASTE  Please do not use if you have an allergy to CHG or antibacterial soaps. If your skin becomes reddened/irritated stop using the CHG.  Do not shave (including legs and underarms) for at least 48 hours prior to first CHG shower. It is OK to shave your face.  Please follow these instructions carefully.   1. Shower the NIGHT BEFORE SURGERY and the MORNING OF SURGERY with CHG Soap.   2. If you chose to wash your hair, wash your hair first  as usual with your normal shampoo.  3. After you shampoo, rinse your hair and body thoroughly to remove the shampoo.  4. Use CHG as you would any other liquid soap. You can apply CHG directly to the skin and wash gently with a scrungie or a clean washcloth.   5. Apply the CHG Soap to your body ONLY FROM THE NECK DOWN.  Do not use on open wounds or open sores. Avoid contact with your eyes, ears, mouth and genitals (private parts). Wash Face and genitals  (private parts)  with your normal soap.   6. Wash thoroughly, paying special attention to the area where your surgery will be performed.  7. Thoroughly rinse your body with warm water from the neck down.  8. DO NOT shower/wash with your normal soap after using and rinsing off the CHG Soap.  9. Pat yourself dry with a CLEAN TOWEL.  10. Wear CLEAN PAJAMAS to bed the night before surgery, wear comfortable clothes the morning of surgery  11. Place CLEAN SHEETS on your bed the night of your first shower and DO NOT SLEEP WITH PETS.    Day of Surgery:    Please shower the morning of surgery with the CHG soap Do not apply any deodorants/lotions.  Please wear clean clothes to the hospital/surgery center.   Remember to brush your teeth WITH YOUR REGULAR TOOTHPASTE.   Please read over the following fact sheets that you were given.

## 2019-07-08 ENCOUNTER — Other Ambulatory Visit: Payer: Self-pay

## 2019-07-08 ENCOUNTER — Encounter (HOSPITAL_COMMUNITY): Payer: Self-pay

## 2019-07-08 ENCOUNTER — Encounter (HOSPITAL_COMMUNITY)
Admission: RE | Admit: 2019-07-08 | Discharge: 2019-07-08 | Disposition: A | Discharge status: Home / Self Care | Payer: Medicare Other | Source: Ambulatory Visit | Attending: Neurosurgery | Admitting: Neurosurgery

## 2019-07-08 DIAGNOSIS — I10 Essential (primary) hypertension: Secondary | ICD-10-CM | POA: Insufficient documentation

## 2019-07-08 DIAGNOSIS — M48062 Spinal stenosis, lumbar region with neurogenic claudication: Secondary | ICD-10-CM | POA: Insufficient documentation

## 2019-07-08 DIAGNOSIS — Z01818 Encounter for other preprocedural examination: Secondary | ICD-10-CM | POA: Insufficient documentation

## 2019-07-08 LAB — BASIC METABOLIC PANEL
Anion gap: 13 (ref 5–15)
BUN: 13 mg/dL (ref 6–20)
CO2: 23 mmol/L (ref 22–32)
Calcium: 9.3 mg/dL (ref 8.9–10.3)
Chloride: 106 mmol/L (ref 98–111)
Creatinine, Ser: 0.79 mg/dL (ref 0.44–1.00)
GFR calc Af Amer: >60 mL/min (ref >60)
GFR calc non Af Amer: >60 mL/min (ref >60)
Glucose, Bld: 93 mg/dL (ref 70–99)
Potassium: 3.2 mmol/L — ABNORMAL LOW (ref 3.5–5.1)
Sodium: 142 mmol/L (ref 135–145)

## 2019-07-08 LAB — TYPE AND SCREEN
ABO/RH(D): B POS
Antibody Screen: NEGATIVE

## 2019-07-08 LAB — CBC
HCT: 37 % (ref 36.0–46.0)
Hemoglobin: 11.6 g/dL — ABNORMAL LOW (ref 12.0–15.0)
MCH: 23.2 pg — ABNORMAL LOW (ref 26.0–34.0)
MCHC: 31.4 g/dL (ref 30.0–36.0)
MCV: 73.9 fL — ABNORMAL LOW (ref 80.0–100.0)
Platelets: 373 10*3/uL (ref 150–400)
RBC: 5.01 MIL/uL (ref 3.87–5.11)
RDW: 21 % — ABNORMAL HIGH (ref 11.5–15.5)
WBC: 12.9 10*3/uL — ABNORMAL HIGH (ref 4.0–10.5)
nRBC: 0 % (ref 0.0–0.2)

## 2019-07-08 LAB — SURGICAL PCR SCREEN
MRSA, PCR: NEGATIVE
Staphylococcus aureus: NEGATIVE

## 2019-07-08 NOTE — Progress Notes (Signed)
PCP: Dr. Donavan Burnet Cardiologist: denies   EKG: today CXR: n/a ECHO: denies Stress Test: denies Cardiac Cath: denies  No orders at PAT appt, labs ordered per anesthesia. Has Covid testing 10/5  Patient denies shortness of breath, fever, cough, and chest pain at PAT appointment.  Patient verbalized understanding of instructions provided today at the PAT appointment.  Patient asked to review instructions at home and day of surgery.

## 2019-07-12 ENCOUNTER — Other Ambulatory Visit (HOSPITAL_COMMUNITY)
Admission: RE | Admit: 2019-07-12 | Discharge: 2019-07-12 | Disposition: A | Discharge status: Home / Self Care | Payer: Medicare Other | Source: Ambulatory Visit | Attending: Neurosurgery | Admitting: Neurosurgery

## 2019-07-12 LAB — SARS CORONAVIRUS 2 (TAT 6-24 HRS): SARS Coronavirus 2: NEGATIVE

## 2019-07-13 NOTE — Anesthesia Preprocedure Evaluation (Addendum)
Anesthesia Evaluation  Patient identified by MRN, date of birth, ID band Patient awake    Reviewed: Allergy & Precautions, NPO status , Patient's Chart, lab work & pertinent test results  Airway Mallampati: II  TM Distance: >3 FB Neck ROM: Full    Dental  (+) Dental Advisory Given   Pulmonary asthma , former smoker,    breath sounds clear to auscultation       Cardiovascular hypertension, Pt. on medications and Pt. on home beta blockers  Rhythm:Regular Rate:Normal     Neuro/Psych  Headaches,    GI/Hepatic Neg liver ROS, hiatal hernia, GERD  ,  Endo/Other  negative endocrine ROS  Renal/GU negative Renal ROS     Musculoskeletal  (+) Arthritis , Fibromyalgia -  Abdominal   Peds  Hematology  (+) anemia ,   Anesthesia Other Findings   Reproductive/Obstetrics                            Lab Results  Component Value Date   WBC 12.9 (H) 07/08/2019   HGB 11.6 (L) 07/08/2019   HCT 37.0 07/08/2019   MCV 73.9 (L) 07/08/2019   PLT 373 07/08/2019   Lab Results  Component Value Date   CREATININE 0.79 07/08/2019   BUN 13 07/08/2019   NA 142 07/08/2019   K 3.2 (L) 07/08/2019   CL 106 07/08/2019   CO2 23 07/08/2019    Anesthesia Physical Anesthesia Plan  ASA: II  Anesthesia Plan: General   Post-op Pain Management:    Induction: Intravenous  PONV Risk Score and Plan: 3 and Dexamethasone, Ondansetron, Treatment may vary due to age or medical condition and Midazolam  Airway Management Planned: Oral ETT  Additional Equipment:   Intra-op Plan:   Post-operative Plan: Extubation in OR  Informed Consent: I have reviewed the patients History and Physical, chart, labs and discussed the procedure including the risks, benefits and alternatives for the proposed anesthesia with the patient or authorized representative who has indicated his/her understanding and acceptance.     Dental advisory  given  Plan Discussed with: CRNA  Anesthesia Plan Comments:        Anesthesia Quick Evaluation

## 2019-07-14 ENCOUNTER — Inpatient Hospital Stay (HOSPITAL_COMMUNITY): Payer: Medicare Other | Admitting: Certified Registered"

## 2019-07-14 ENCOUNTER — Encounter (HOSPITAL_COMMUNITY): Payer: Self-pay | Admitting: General Practice

## 2019-07-14 ENCOUNTER — Inpatient Hospital Stay (HOSPITAL_COMMUNITY): Payer: Medicare Other

## 2019-07-14 ENCOUNTER — Inpatient Hospital Stay (HOSPITAL_COMMUNITY)
Admission: RE | Admit: 2019-07-14 | Discharge: 2019-07-16 | DRG: 455 | Disposition: A | Discharge status: Home / Self Care | Payer: Medicare Other | Attending: Neurosurgery | Admitting: Neurosurgery

## 2019-07-14 ENCOUNTER — Other Ambulatory Visit: Payer: Self-pay

## 2019-07-14 ENCOUNTER — Encounter (HOSPITAL_COMMUNITY)
Admission: RE | Disposition: A | Discharge status: Home / Self Care | Payer: Self-pay | Source: Home / Self Care | Attending: Neurosurgery

## 2019-07-14 DIAGNOSIS — Z6833 Body mass index (BMI) 33.0-33.9, adult: Secondary | ICD-10-CM | POA: Diagnosis not present

## 2019-07-14 DIAGNOSIS — Z888 Allergy status to other drugs, medicaments and biological substances status: Secondary | ICD-10-CM

## 2019-07-14 DIAGNOSIS — Z91040 Latex allergy status: Secondary | ICD-10-CM

## 2019-07-14 DIAGNOSIS — Z886 Allergy status to analgesic agent status: Secondary | ICD-10-CM | POA: Diagnosis not present

## 2019-07-14 DIAGNOSIS — M4316 Spondylolisthesis, lumbar region: Secondary | ICD-10-CM | POA: Diagnosis present

## 2019-07-14 DIAGNOSIS — I10 Essential (primary) hypertension: Secondary | ICD-10-CM | POA: Diagnosis present

## 2019-07-14 DIAGNOSIS — K589 Irritable bowel syndrome without diarrhea: Secondary | ICD-10-CM | POA: Diagnosis not present

## 2019-07-14 DIAGNOSIS — Z91041 Radiographic dye allergy status: Secondary | ICD-10-CM | POA: Diagnosis not present

## 2019-07-14 DIAGNOSIS — Z79899 Other long term (current) drug therapy: Secondary | ICD-10-CM

## 2019-07-14 DIAGNOSIS — Z20828 Contact with and (suspected) exposure to other viral communicable diseases: Secondary | ICD-10-CM | POA: Diagnosis present

## 2019-07-14 DIAGNOSIS — Z87891 Personal history of nicotine dependence: Secondary | ICD-10-CM | POA: Diagnosis not present

## 2019-07-14 DIAGNOSIS — G4700 Insomnia, unspecified: Secondary | ICD-10-CM | POA: Diagnosis not present

## 2019-07-14 DIAGNOSIS — E669 Obesity, unspecified: Secondary | ICD-10-CM | POA: Diagnosis present

## 2019-07-14 DIAGNOSIS — Z981 Arthrodesis status: Secondary | ICD-10-CM

## 2019-07-14 DIAGNOSIS — M48062 Spinal stenosis, lumbar region with neurogenic claudication: Secondary | ICD-10-CM | POA: Diagnosis not present

## 2019-07-14 DIAGNOSIS — J45909 Unspecified asthma, uncomplicated: Secondary | ICD-10-CM | POA: Diagnosis present

## 2019-07-14 DIAGNOSIS — K219 Gastro-esophageal reflux disease without esophagitis: Secondary | ICD-10-CM | POA: Diagnosis not present

## 2019-07-14 DIAGNOSIS — Z96653 Presence of artificial knee joint, bilateral: Secondary | ICD-10-CM | POA: Diagnosis present

## 2019-07-14 DIAGNOSIS — M797 Fibromyalgia: Secondary | ICD-10-CM | POA: Diagnosis present

## 2019-07-14 DIAGNOSIS — Z419 Encounter for procedure for purposes other than remedying health state, unspecified: Secondary | ICD-10-CM

## 2019-07-14 SURGERY — POSTERIOR LUMBAR FUSION 1 LEVEL
Anesthesia: General | Site: Back

## 2019-07-14 MED ORDER — ROCURONIUM BROMIDE 100 MG/10ML IV SOLN
INTRAVENOUS | Status: DC | PRN
  Administered 2019-07-14: 10 mg via INTRAVENOUS
  Administered 2019-07-14: 50 mg via INTRAVENOUS
  Administered 2019-07-14 (×2): 20 mg via INTRAVENOUS

## 2019-07-14 MED ORDER — MAGNESIUM CITRATE PO SOLN: 1.0000 | Freq: Once | ORAL | Status: DC | PRN

## 2019-07-14 MED ORDER — POTASSIUM CHLORIDE IN NACL 20-0.9 MEQ/L-% IV SOLN: INTRAVENOUS | Status: DC

## 2019-07-14 MED ORDER — ROCURONIUM BROMIDE 10 MG/ML (PF) SYRINGE
PREFILLED_SYRINGE | INTRAVENOUS | Status: AC
  Filled 2019-07-14: qty 10

## 2019-07-14 MED ORDER — OXYCODONE HCL 5 MG PO TABS
ORAL_TABLET | ORAL | Status: AC
  Filled 2019-07-14: qty 2

## 2019-07-14 MED ORDER — CEFAZOLIN SODIUM-DEXTROSE 2-4 GM/100ML-% IV SOLN
2.0000 g | Freq: Once | INTRAVENOUS | Status: AC
  Administered 2019-07-14: 2 g via INTRAVENOUS
  Filled 2019-07-14: qty 100

## 2019-07-14 MED ORDER — PROPOFOL 10 MG/ML IV BOLUS
INTRAVENOUS | Status: DC | PRN
  Administered 2019-07-14: 160 mg via INTRAVENOUS

## 2019-07-14 MED ORDER — ONDANSETRON HCL 4 MG PO TABS: 4.0000 mg | ORAL_TABLET | Freq: Four times a day (QID) | ORAL | Status: DC | PRN

## 2019-07-14 MED ORDER — PANTOPRAZOLE SODIUM 40 MG PO TBEC
40.0000 mg | DELAYED_RELEASE_TABLET | Freq: Every day | ORAL | Status: DC
  Filled 2019-07-14: qty 1

## 2019-07-14 MED ORDER — ATENOLOL 50 MG PO TABS
50.0000 mg | ORAL_TABLET | Freq: Every day | ORAL | Status: DC
  Administered 2019-07-15 – 2019-07-16 (×2): 50 mg via ORAL
  Filled 2019-07-14 (×2): qty 1

## 2019-07-14 MED ORDER — FUROSEMIDE 20 MG PO TABS
20.0000 mg | ORAL_TABLET | Freq: Every day | ORAL | Status: DC
  Administered 2019-07-15 – 2019-07-16 (×2): 20 mg via ORAL
  Filled 2019-07-14 (×3): qty 1

## 2019-07-14 MED ORDER — OXYCODONE HCL 5 MG PO TABS: 5.0000 mg | ORAL_TABLET | ORAL | Status: DC | PRN

## 2019-07-14 MED ORDER — GABAPENTIN 600 MG PO TABS
1200.0000 mg | ORAL_TABLET | Freq: Three times a day (TID) | ORAL | Status: DC
  Administered 2019-07-14 – 2019-07-16 (×6): 1200 mg via ORAL
  Filled 2019-07-14 (×6): qty 2

## 2019-07-14 MED ORDER — DEXAMETHASONE SODIUM PHOSPHATE 10 MG/ML IJ SOLN
INTRAMUSCULAR | Status: DC | PRN
  Administered 2019-07-14: 10 mg via INTRAVENOUS

## 2019-07-14 MED ORDER — SODIUM CHLORIDE 0.9% FLUSH: 3.0000 mL | Freq: Two times a day (BID) | INTRAVENOUS | Status: DC

## 2019-07-14 MED ORDER — GLYCOPYRROLATE PF 0.2 MG/ML IJ SOSY
PREFILLED_SYRINGE | INTRAMUSCULAR | Status: AC
  Filled 2019-07-14: qty 1

## 2019-07-14 MED ORDER — THROMBIN 20000 UNITS EX SOLR
CUTANEOUS | Status: AC
  Filled 2019-07-14: qty 20000

## 2019-07-14 MED ORDER — SODIUM CHLORIDE 0.9 % IV SOLN
INTRAVENOUS | Status: DC | PRN
  Administered 2019-07-14: 25 ug/min via INTRAVENOUS

## 2019-07-14 MED ORDER — ACETAMINOPHEN 500 MG PO TABS
1000.0000 mg | ORAL_TABLET | Freq: Four times a day (QID) | ORAL | Status: AC
  Administered 2019-07-14 – 2019-07-15 (×3): 1000 mg via ORAL
  Filled 2019-07-14 (×3): qty 2

## 2019-07-14 MED ORDER — ONDANSETRON HCL 4 MG/2ML IJ SOLN
INTRAMUSCULAR | Status: DC | PRN
  Administered 2019-07-14: 4 mg via INTRAVENOUS

## 2019-07-14 MED ORDER — HYDROMORPHONE HCL 1 MG/ML IJ SOLN
INTRAMUSCULAR | Status: AC
  Filled 2019-07-14: qty 1

## 2019-07-14 MED ORDER — SODIUM CHLORIDE 0.9 % IV SOLN: 250.0000 mL | INTRAVENOUS | Status: DC

## 2019-07-14 MED ORDER — DIPHENHYDRAMINE HCL 25 MG PO CAPS
50.0000 mg | ORAL_CAPSULE | Freq: Four times a day (QID) | ORAL | Status: DC | PRN
  Administered 2019-07-14 – 2019-07-15 (×3): 50 mg via ORAL
  Filled 2019-07-14 (×3): qty 2

## 2019-07-14 MED ORDER — MENTHOL 3 MG MT LOZG: 1.0000 | LOZENGE | OROMUCOSAL | Status: DC | PRN

## 2019-07-14 MED ORDER — ALBUTEROL SULFATE HFA 108 (90 BASE) MCG/ACT IN AERS: 2.0000 | INHALATION_SPRAY | Freq: Four times a day (QID) | RESPIRATORY_TRACT | Status: DC | PRN

## 2019-07-14 MED ORDER — ONDANSETRON HCL 4 MG/2ML IJ SOLN: 4.0000 mg | Freq: Four times a day (QID) | INTRAMUSCULAR | Status: DC | PRN

## 2019-07-14 MED ORDER — BUPIVACAINE HCL (PF) 0.5 % IJ SOLN
INTRAMUSCULAR | Status: DC | PRN
  Administered 2019-07-14: 30 mL

## 2019-07-14 MED ORDER — SUGAMMADEX SODIUM 200 MG/2ML IV SOLN
INTRAVENOUS | Status: DC | PRN
  Administered 2019-07-14: 200 mg via INTRAVENOUS

## 2019-07-14 MED ORDER — THROMBIN 20000 UNITS EX SOLR
CUTANEOUS | Status: DC | PRN
  Administered 2019-07-14: 10:00:00 20 mL via TOPICAL

## 2019-07-14 MED ORDER — BISACODYL 5 MG PO TBEC: 5.0000 mg | DELAYED_RELEASE_TABLET | Freq: Every day | ORAL | Status: DC | PRN

## 2019-07-14 MED ORDER — DIAZEPAM 5 MG PO TABS
5.0000 mg | ORAL_TABLET | Freq: Four times a day (QID) | ORAL | Status: DC | PRN
  Administered 2019-07-14 – 2019-07-15 (×2): 5 mg via ORAL
  Filled 2019-07-14 (×2): qty 1

## 2019-07-14 MED ORDER — MIDAZOLAM HCL 2 MG/2ML IJ SOLN
INTRAMUSCULAR | Status: AC
  Filled 2019-07-14: qty 2

## 2019-07-14 MED ORDER — LACTATED RINGERS IV SOLN
INTRAVENOUS | Status: DC
  Administered 2019-07-14 (×2): via INTRAVENOUS

## 2019-07-14 MED ORDER — ONDANSETRON HCL 4 MG/2ML IJ SOLN
INTRAMUSCULAR | Status: AC
  Filled 2019-07-14: qty 2

## 2019-07-14 MED ORDER — PHENOL 1.4 % MT LIQD
1.0000 | OROMUCOSAL | Status: DC | PRN
  Filled 2019-07-14: qty 177

## 2019-07-14 MED ORDER — PROMETHAZINE HCL 25 MG/ML IJ SOLN: 6.2500 mg | INTRAMUSCULAR | Status: DC | PRN

## 2019-07-14 MED ORDER — 0.9 % SODIUM CHLORIDE (POUR BTL) OPTIME
TOPICAL | Status: DC | PRN
  Administered 2019-07-14: 1000 mL

## 2019-07-14 MED ORDER — HYDROMORPHONE HCL 1 MG/ML IJ SOLN
1.0000 mg | INTRAMUSCULAR | Status: DC | PRN
  Administered 2019-07-14: 1 mg via INTRAVENOUS
  Filled 2019-07-14: qty 1

## 2019-07-14 MED ORDER — HYDROXYZINE HCL 25 MG PO TABS
50.0000 mg | ORAL_TABLET | Freq: Two times a day (BID) | ORAL | Status: DC
  Administered 2019-07-14 – 2019-07-16 (×4): 50 mg via ORAL
  Filled 2019-07-14 (×4): qty 2

## 2019-07-14 MED ORDER — POTASSIUM CHLORIDE CRYS ER 10 MEQ PO TBCR
10.0000 meq | EXTENDED_RELEASE_TABLET | Freq: Every day | ORAL | Status: DC
  Administered 2019-07-15 – 2019-07-16 (×2): 10 meq via ORAL
  Filled 2019-07-14 (×3): qty 1

## 2019-07-14 MED ORDER — ASPIRIN EC 81 MG PO TBEC
81.0000 mg | DELAYED_RELEASE_TABLET | Freq: Every day | ORAL | Status: DC
  Administered 2019-07-14 – 2019-07-16 (×3): 81 mg via ORAL
  Filled 2019-07-14 (×3): qty 1

## 2019-07-14 MED ORDER — CEFAZOLIN SODIUM-DEXTROSE 2-4 GM/100ML-% IV SOLN
INTRAVENOUS | Status: AC
  Filled 2019-07-14: qty 100

## 2019-07-14 MED ORDER — SENNA 8.6 MG PO TABS
1.0000 | ORAL_TABLET | Freq: Two times a day (BID) | ORAL | Status: DC
  Administered 2019-07-14 – 2019-07-16 (×4): 8.6 mg via ORAL
  Filled 2019-07-14 (×4): qty 1

## 2019-07-14 MED ORDER — OXYCODONE HCL 5 MG PO TABS
10.0000 mg | ORAL_TABLET | ORAL | Status: DC | PRN
  Administered 2019-07-14 – 2019-07-16 (×11): 10 mg via ORAL
  Filled 2019-07-14 (×10): qty 2

## 2019-07-14 MED ORDER — PANTOPRAZOLE SODIUM 40 MG PO TBEC
40.0000 mg | DELAYED_RELEASE_TABLET | Freq: Every day | ORAL | Status: DC
  Administered 2019-07-14 – 2019-07-16 (×2): 40 mg via ORAL
  Filled 2019-07-14: qty 1

## 2019-07-14 MED ORDER — ALBUMIN HUMAN 5 % IV SOLN
INTRAVENOUS | Status: DC | PRN
  Administered 2019-07-14: 14:00:00 via INTRAVENOUS

## 2019-07-14 MED ORDER — PROPOFOL 10 MG/ML IV BOLUS
INTRAVENOUS | Status: AC
  Filled 2019-07-14: qty 40

## 2019-07-14 MED ORDER — LUBIPROSTONE 24 MCG PO CAPS
24.0000 ug | ORAL_CAPSULE | Freq: Two times a day (BID) | ORAL | Status: DC
  Administered 2019-07-14 – 2019-07-16 (×4): 24 ug via ORAL
  Filled 2019-07-14 (×5): qty 1

## 2019-07-14 MED ORDER — CALCIUM CARBONATE-VITAMIN D 500-200 MG-UNIT PO TABS
1.0000 | ORAL_TABLET | Freq: Two times a day (BID) | ORAL | Status: DC
  Administered 2019-07-14 – 2019-07-16 (×4): 1 via ORAL
  Filled 2019-07-14 (×4): qty 1

## 2019-07-14 MED ORDER — CHLORZOXAZONE 500 MG PO TABS
500.0000 mg | ORAL_TABLET | Freq: Three times a day (TID) | ORAL | Status: DC
  Administered 2019-07-15 – 2019-07-16 (×4): 500 mg via ORAL
  Filled 2019-07-14 (×8): qty 1

## 2019-07-14 MED ORDER — SUFENTANIL CITRATE 50 MCG/ML IV SOLN
INTRAVENOUS | Status: AC
  Filled 2019-07-14: qty 1

## 2019-07-14 MED ORDER — MIDAZOLAM HCL 5 MG/5ML IJ SOLN
INTRAMUSCULAR | Status: DC | PRN
  Administered 2019-07-14: 2 mg via INTRAVENOUS

## 2019-07-14 MED ORDER — LIDOCAINE-EPINEPHRINE 0.5 %-1:200000 IJ SOLN
INTRAMUSCULAR | Status: DC | PRN
  Administered 2019-07-14: 10 mL

## 2019-07-14 MED ORDER — THROMBIN 5000 UNITS EX SOLR
CUTANEOUS | Status: AC
  Filled 2019-07-14: qty 5000

## 2019-07-14 MED ORDER — OXYCODONE HCL ER 15 MG PO T12A
15.0000 mg | EXTENDED_RELEASE_TABLET | Freq: Two times a day (BID) | ORAL | Status: DC
  Administered 2019-07-14 – 2019-07-15 (×3): 15 mg via ORAL
  Filled 2019-07-14 (×3): qty 1

## 2019-07-14 MED ORDER — ACETAMINOPHEN 650 MG RE SUPP: 650.0000 mg | RECTAL | Status: DC | PRN

## 2019-07-14 MED ORDER — LIDOCAINE-EPINEPHRINE 0.5 %-1:200000 IJ SOLN
INTRAMUSCULAR | Status: AC
  Filled 2019-07-14: qty 1

## 2019-07-14 MED ORDER — ACETAMINOPHEN 325 MG PO TABS
650.0000 mg | ORAL_TABLET | ORAL | Status: DC | PRN
  Administered 2019-07-15 – 2019-07-16 (×4): 650 mg via ORAL
  Filled 2019-07-14 (×4): qty 2

## 2019-07-14 MED ORDER — SODIUM CHLORIDE 0.9% FLUSH: 3.0000 mL | INTRAVENOUS | Status: DC | PRN

## 2019-07-14 MED ORDER — DEXAMETHASONE SODIUM PHOSPHATE 10 MG/ML IJ SOLN
INTRAMUSCULAR | Status: AC
  Filled 2019-07-14: qty 1

## 2019-07-14 MED ORDER — ZOLPIDEM TARTRATE 5 MG PO TABS: 5.0000 mg | ORAL_TABLET | Freq: Every evening | ORAL | Status: DC | PRN

## 2019-07-14 MED ORDER — ALBUTEROL SULFATE (2.5 MG/3ML) 0.083% IN NEBU: 2.5000 mg | INHALATION_SOLUTION | Freq: Four times a day (QID) | RESPIRATORY_TRACT | Status: DC | PRN

## 2019-07-14 MED ORDER — HYDROCHLOROTHIAZIDE 25 MG PO TABS
25.0000 mg | ORAL_TABLET | Freq: Every day | ORAL | Status: DC
  Administered 2019-07-15 – 2019-07-16 (×2): 25 mg via ORAL
  Filled 2019-07-14 (×2): qty 1

## 2019-07-14 MED ORDER — HYDROMORPHONE HCL 1 MG/ML IJ SOLN
0.2500 mg | INTRAMUSCULAR | Status: DC | PRN
  Administered 2019-07-14 (×2): 0.5 mg via INTRAVENOUS

## 2019-07-14 MED ORDER — BUPIVACAINE HCL (PF) 0.5 % IJ SOLN
INTRAMUSCULAR | Status: AC
  Filled 2019-07-14: qty 30

## 2019-07-14 MED ORDER — SUFENTANIL CITRATE 50 MCG/ML IV SOLN
INTRAVENOUS | Status: DC | PRN
  Administered 2019-07-14: 5 ug via INTRAVENOUS
  Administered 2019-07-14: 10 ug via INTRAVENOUS
  Administered 2019-07-14 (×3): 5 ug via INTRAVENOUS

## 2019-07-14 MED ORDER — LIDOCAINE 2% (20 MG/ML) 5 ML SYRINGE
INTRAMUSCULAR | Status: AC
  Filled 2019-07-14: qty 5

## 2019-07-14 MED ORDER — LIDOCAINE HCL (CARDIAC) PF 100 MG/5ML IV SOSY
PREFILLED_SYRINGE | INTRAVENOUS | Status: DC | PRN
  Administered 2019-07-14: 30 mg via INTRAVENOUS

## 2019-07-14 SURGICAL SUPPLY — 74 items
ADH SKN CLS APL DERMABOND .7 (GAUZE/BANDAGES/DRESSINGS) ×1
APL SKNCLS STERI-STRIP NONHPOA (GAUZE/BANDAGES/DRESSINGS)
BASKET BONE COLLECTION (BASKET) ×2 IMPLANT
BENZOIN TINCTURE PRP APPL 2/3 (GAUZE/BANDAGES/DRESSINGS) IMPLANT
BLADE CLIPPER SURG (BLADE) IMPLANT
BUR MATCHSTICK NEURO 3.0 LAGG (BURR) ×3 IMPLANT
BUR PRECISION FLUTE 5.0 (BURR) ×3 IMPLANT
CAGE POST IBF 10X8D 26/9 (Cage) ×4 IMPLANT
CANISTER SUCT 3000ML PPV (MISCELLANEOUS) ×3 IMPLANT
CAP RELINE MOD TULIP RMM (Cap) ×4 IMPLANT
CARTRIDGE OIL MAESTRO DRILL (MISCELLANEOUS) ×1 IMPLANT
CATH FOLEY LATEX FREE 14FR (CATHETERS) ×3
CATH FOLEY LF 14FR (CATHETERS) IMPLANT
CLOSURE WOUND 1/2 X4 (GAUZE/BANDAGES/DRESSINGS)
CONT SPEC 4OZ CLIKSEAL STRL BL (MISCELLANEOUS) ×3 IMPLANT
COVER BACK TABLE 60X90IN (DRAPES) ×3 IMPLANT
COVER WAND RF STERILE (DRAPES) ×1 IMPLANT
DECANTER SPIKE VIAL GLASS SM (MISCELLANEOUS) ×3 IMPLANT
DERMABOND ADVANCED (GAUZE/BANDAGES/DRESSINGS) ×2
DERMABOND ADVANCED .7 DNX12 (GAUZE/BANDAGES/DRESSINGS) ×1 IMPLANT
DIFFUSER DRILL AIR PNEUMATIC (MISCELLANEOUS) ×3 IMPLANT
DRAPE C-ARM 42X72 X-RAY (DRAPES) ×4 IMPLANT
DRAPE C-ARMOR (DRAPES) ×2 IMPLANT
DRAPE LAPAROTOMY 100X72X124 (DRAPES) ×3 IMPLANT
DRAPE POUCH INSTRU U-SHP 10X18 (DRAPES) ×1 IMPLANT
DRAPE SURG 17X23 STRL (DRAPES) ×3 IMPLANT
DRSG OPSITE POSTOP 4X8 (GAUZE/BANDAGES/DRESSINGS) ×2 IMPLANT
DURAPREP 26ML APPLICATOR (WOUND CARE) ×3 IMPLANT
ELECT BLADE 4.0 EZ CLEAN MEGAD (MISCELLANEOUS) ×3
ELECT REM PT RETURN 9FT ADLT (ELECTROSURGICAL) ×3
ELECTRODE BLDE 4.0 EZ CLN MEGD (MISCELLANEOUS) IMPLANT
ELECTRODE REM PT RTRN 9FT ADLT (ELECTROSURGICAL) ×1 IMPLANT
GAUZE 4X4 16PLY RFD (DISPOSABLE) IMPLANT
GAUZE SPONGE 4X4 12PLY STRL (GAUZE/BANDAGES/DRESSINGS) IMPLANT
GLOVE ECLIPSE 6.5 STRL STRAW (GLOVE) ×2 IMPLANT
GLOVE EXAM NITRILE XL STR (GLOVE) IMPLANT
GLOVE SURG SS PI 6.5 STRL IVOR (GLOVE) ×4 IMPLANT
GLOVE SURG SS PI 7.5 STRL IVOR (GLOVE) ×8 IMPLANT
GLOVE SURG SS PI 8.0 STRL IVOR (GLOVE) ×2 IMPLANT
GOWN STRL REUS W/ TWL LRG LVL3 (GOWN DISPOSABLE) ×2 IMPLANT
GOWN STRL REUS W/ TWL XL LVL3 (GOWN DISPOSABLE) IMPLANT
GOWN STRL REUS W/TWL 2XL LVL3 (GOWN DISPOSABLE) IMPLANT
GOWN STRL REUS W/TWL LRG LVL3 (GOWN DISPOSABLE) ×9
GOWN STRL REUS W/TWL XL LVL3 (GOWN DISPOSABLE) ×3
GRAFT BN 5X1XSPNE CVD POST DBM (Bone Implant) IMPLANT
GRAFT BONE MAGNIFUSE 1X5CM (Bone Implant) ×3 IMPLANT
KIT BASIN OR (CUSTOM PROCEDURE TRAY) ×3 IMPLANT
KIT POSITION SURG JACKSON T1 (MISCELLANEOUS) ×3 IMPLANT
KIT TURNOVER KIT B (KITS) ×3 IMPLANT
MILL MEDIUM DISP (BLADE) ×2 IMPLANT
NDL HYPO 25X1 1.5 SAFETY (NEEDLE) ×1 IMPLANT
NDL SPNL 18GX3.5 QUINCKE PK (NEEDLE) IMPLANT
NEEDLE HYPO 25X1 1.5 SAFETY (NEEDLE) ×3 IMPLANT
NEEDLE SPNL 18GX3.5 QUINCKE PK (NEEDLE) ×3 IMPLANT
NS IRRIG 1000ML POUR BTL (IV SOLUTION) ×3 IMPLANT
OIL CARTRIDGE MAESTRO DRILL (MISCELLANEOUS) ×3
PACK LAMINECTOMY NEURO (CUSTOM PROCEDURE TRAY) ×3 IMPLANT
PAD ARMBOARD 7.5X6 YLW CONV (MISCELLANEOUS) ×6 IMPLANT
ROD RELINE COCR 5.0X60MM (Rod) ×2 IMPLANT
ROD RELINE COCR LORD 5.0X65 (Rod) ×2 IMPLANT
SCREW LOCK RSS 4.5/5.0MM (Screw) ×10 IMPLANT
SHANK RELINE O MOD 5.5X45MM (Screw) ×4 IMPLANT
SPONGE LAP 4X18 RFD (DISPOSABLE) IMPLANT
SPONGE SURGIFOAM ABS GEL 100 (HEMOSTASIS) ×3 IMPLANT
STRIP CLOSURE SKIN 1/2X4 (GAUZE/BANDAGES/DRESSINGS) IMPLANT
SUT PROLENE 6 0 BV (SUTURE) IMPLANT
SUT VIC AB 0 CT1 18XCR BRD8 (SUTURE) ×1 IMPLANT
SUT VIC AB 0 CT1 8-18 (SUTURE) ×6
SUT VIC AB 2-0 CT1 18 (SUTURE) ×5 IMPLANT
SUT VIC AB 3-0 SH 8-18 (SUTURE) ×3 IMPLANT
TOWEL GREEN STERILE (TOWEL DISPOSABLE) ×3 IMPLANT
TOWEL GREEN STERILE FF (TOWEL DISPOSABLE) ×3 IMPLANT
TRAY FOLEY MTR SLVR 16FR STAT (SET/KITS/TRAYS/PACK) ×1 IMPLANT
WATER STERILE IRR 1000ML POUR (IV SOLUTION) ×3 IMPLANT

## 2019-07-14 NOTE — Transfer of Care (Signed)
Immediate Anesthesia Transfer of Care Note  Patient: SOLSTICE LASTINGER  Procedure(s) Performed: Lumbar three-four Posterior lumbar interbody fusion with extension (N/A Back)  Patient Location: PACU  Anesthesia Type:General  Level of Consciousness: awake and alert   Airway & Oxygen Therapy: Patient Spontanous Breathing and Patient connected to nasal cannula oxygen  Post-op Assessment: Report given to RN and Post -op Vital signs reviewed and stable  Post vital signs: Reviewed and stable  Last Vitals:  Vitals Value Taken Time  BP 150/82 07/14/19 1534  Temp 36.7 C 07/14/19 1534  Pulse 73 07/14/19 1538  Resp 12 07/14/19 1539  SpO2 100 % 07/14/19 1538  Vitals shown include unvalidated device data.  Last Pain:  Vitals:   07/14/19 1534  TempSrc:   PainSc: 10-Worst pain ever      Patients Stated Pain Goal: 3 (23/34/35 6861)  Complications: No apparent anesthesia complications

## 2019-07-14 NOTE — Anesthesia Procedure Notes (Signed)
Procedure Name: Intubation Date/Time: 07/14/2019 11:50 AM Performed by: Eligha Bridegroom, CRNA Pre-anesthesia Checklist: Patient identified, Emergency Drugs available, Suction available, Patient being monitored and Timeout performed Patient Re-evaluated:Patient Re-evaluated prior to induction Oxygen Delivery Method: Circle system utilized Preoxygenation: Pre-oxygenation with 100% oxygen Induction Type: IV induction Ventilation: Mask ventilation without difficulty Laryngoscope Size: Mac and 3 Grade View: Grade I Tube type: Oral Tube size: 7.0 mm Number of attempts: 1 Airway Equipment and Method: Stylet Secured at: 21 cm Tube secured with: Tape Dental Injury: Teeth and Oropharynx as per pre-operative assessment

## 2019-07-14 NOTE — Anesthesia Postprocedure Evaluation (Signed)
Anesthesia Post Note  Patient: [name]  Procedure(s) Performed: Lumbar three-four Posterior lumbar interbody fusion with extension (N/A Back)     Patient location during evaluation: PACU Anesthesia Type: General Level of consciousness: awake and alert Pain management: pain level controlled Vital Signs Assessment: post-procedure vital signs reviewed and stable Respiratory status: spontaneous breathing, nonlabored ventilation, respiratory function stable and patient connected to nasal cannula oxygen Cardiovascular status: blood pressure returned to baseline and stable Postop Assessment: no apparent nausea or vomiting Anesthetic complications: no    Last Vitals:  Vitals:   07/14/19 1630 07/14/19 1719  BP:  (!) 150/71  Pulse: 73 78  Resp: 17 18  Temp:  37 C  SpO2: 97% 100%    Last Pain:  Vitals:   07/14/19 1719  TempSrc: Oral  PainSc:                  Tiajuana Amass

## 2019-07-14 NOTE — Addendum Note (Signed)
Addendum  created 07/14/19 2004 by Eligha Bridegroom, CRNA   Intraprocedure Event edited

## 2019-07-14 NOTE — H&P (Signed)
[name] presents with pain in the lumbar region and the lower extremities, classic for neurogenic claudication. Her MRI shows stenosis at the adjacent level L3/4, and listhesis.  Allergies  Allergen Reactions  . Monosodium Glutamate Anaphylaxis and Swelling    Eyes swollen shut, facial swelling, tongue swelling.  . Shellfish Allergy Anaphylaxis  . Celebrex [Celecoxib] Itching    Only allergic to generic brand  . Contrast Media [Iodinated Diagnostic Agents] Itching and Nausea Only    "could not walk"  . Diclofenac Itching    Can take the name brand Voltaren  . Other Other (See Comments)    Pet dander, Mildew, Mold  Cannot tolerate generic DICLOFENAC GEL--MUST USE BRAND NAME (GENERIC CAUSES ITCHING)   . Latex Itching and Rash    Latex glove with powder   Past Medical History:  Diagnosis Date  . Anemia   . Anemia   . Ankle syndesmosis disruption   . Anxiety    takes Ativan and Valium, after mother passed  . Arthritis    bilateral knees s/p knee replacement bilaterally  . Asthma    but pt states not severe enough to even have an inhaler  . Bronchitis   . Bruising    pt states unexplained d/t fibromyalgia  . Chronic back pain    2012 tailbone surgery and 3 lower discs.   . Closed fracture of distal end of right fibula and tibia   . Depression    from Fibromyalgia diagnosis; not taking medicine. since 2001  . Dizziness    rarely  . Fibromyalgia    diagnosed 2001  . GERD (gastroesophageal reflux disease)    Prilosec occasionally  . Headache(784.0)    "sinus headaches"  . History of hiatal hernia   . Hypertension    since 2013  . Hyperthyroidism    subclinical, no treatment; thyroid nodules  . IBS (irritable bowel syndrome)   . Impaired memory    states from fibromyalgia  . Insomnia    takes Ambien  . Jones fracture    left foot fifth metatarsal  . Multiple allergies    including latex, pet dander, shellfish, pet dander  . Painful orthopaedic hardware right  ankle 05/27/2016  . Seasonal allergies   . Shortness of breath    with exertion;pt states that its related to her weight  . Sore gums    this is why pt is on Amoxil-only takes for dental work  . Tachycardia   . Thyroid goiter   . Varicose vein    protrudes above skin-per pt;vein popped and bruised;ultrasound done to make sure that there were no clots;noclots were found   Past Surgical History:  Procedure Laterality Date  . ANTERIOR LUMBAR FUSION  09/20/2011   Procedure: ANTERIOR LUMBAR FUSION 1 LEVEL;  Surgeon: Carmela HurtKyle L Dayne Chait;  Location: [facility];  Service: Neurosurgery;  Laterality: N/A;  Lumbar five-Sacral One Anterior Lumbar Interbody Fusion /Dr. Early to Approach   . CERVICAL DISC SURGERY     WITH TITANIUM PLATE IN NECK---LEFT SIDE  . CERVICAL SPINE SURGERY  03/24/2019  . CESAREAN SECTION    . fibroidectomy    . FRACTURE SURGERY  05/08/2010   Jones fracture left foot fifth metatarsal  . HARDWARE REMOVAL Right 05/28/2016   Procedure: RIGHT ANKLE HARDWARE REMOVAL;  Surgeon: Salvatore Marvelobert Wainer, MD;  Location: [facility];  Service: Orthopedics;  Laterality: Right;  . IRRIGATION AND DEBRIDEMENT KNEE  04/09/2012   Procedure: IRRIGATION AND DEBRIDEMENT KNEE;  Surgeon: WLacretia Nicks  D Carloyn Manner., MD;  Location: [facility] OR;  Service: Orthopedics;  Laterality: Right;  . JOINT REPLACEMENT  01-27-2012   left total knee and Right total knee  . KNEE SURGERY  2005   Left knee arthroscopy  . NASAL SEPTOPLASTY W/ TURBINOPLASTY  2007   due to recurrent sinusitis  . ORIF ANKLE FRACTURE Right 06/21/2015   Procedure: OPEN REDUCTION INTERNAL FIXATION RIGHT DISTAL FIBULA  FRACTURE AND OPEN REDUCTION INTERNAL FIXATION SYNDESMOSIS ;  Surgeon: Salvatore Marvel, MD;  Location: [facility];  Service: Orthopedics;  Laterality: Right;  . SHOULDER ARTHROSCOPY W/ ROTATOR CUFF REPAIR Right   . SHOULDER SURGERY Left   . SPINE SURGERY  2004   Cervical plate, ACDF  . STERIOD INJECTION  01/27/2012   Procedure:  STEROID INJECTION;  Surgeon: Nilda Simmer, MD;  Location[facility];  Service: Orthopedics;  Laterality: Right;  . TONSILLECTOMY  2007  . TOTAL KNEE ARTHROPLASTY  01/27/2012   Procedure: TOTAL KNEE ARTHROPLASTY;  Surgeon: Nilda Simmer, MD;  Bilateral  . TOTAL KNEE ARTHROPLASTY  04/06/2012   Procedure: TOTAL KNEE ARTHROPLASTY;  Surgeon: Nilda Simmer, MD;  Location: [facility];  Service: Orthopedics;  Laterality: Right;  . TUBAL LIGATION    . UTERINE FIBROID SURGERY     mid 104s   Family History  Problem Relation Age of Onset  . Stroke Father        passed 2004 from pneumonia  . Alcohol abuse Father   . Pulmonary embolism Mother        after minor knee surgery leading to DVT  . Arthritis Sister   . Depression Sister   . Hypertension Sister   . Diabetes Other        grandmother  . Anesthesia problems Neg Hx   . Hypotension Neg Hx   . Malignant hyperthermia Neg Hx   . Pseudochol deficiency Neg Hx    Social History   Socioeconomic History  . Marital status: Divorced    Spouse name: Not on file  . Number of children: Not on file  . Years of education: Not on file  . Highest education level: Not on file  Occupational History  . Not on file  Social Needs  . Financial resource strain: Not on file  . Food insecurity    Worry: Not on file    Inability: Not on file  . Transportation needs    Medical: Not on file    Non-medical: Not on file  Tobacco Use  . Smoking status: Former Smoker    Packs/day: 0.50    Years: 10.00    Pack years: 5.00    Quit date: 12/06/1998    Years since quitting: 20.6  . Smokeless tobacco: Never Used  Substance and Sexual Activity  . Alcohol use: No  . Drug use: No  . Sexual activity: Never  Lifestyle  . Physical activity    Days per week: Not on file    Minutes per session: Not on file  . Stress: Not on file  Relationships  . Social Musician on phone: Not on file    Gets together: Not on file    Attends religious service: Not on  file    Active member of club or organization: Not on file    Attends meetings of clubs or organizations: Not on file    Relationship status: Not on file  . Intimate partner violence    Fear of current or ex  partner: Not on file    Emotionally abused: Not on file    Physically abused: Not on file    Forced sexual activity: Not on file  Other Topics Concern  . Not on file  Social History Narrative   Divorced. 1 son.    Disabled from fibromyalgia, arthritis, back and knee surgeries.    Graduated from High school.       Sister is a patient in our clinic, [facility].    Recently cared for at [facility]. Changed to [place] as new network.    Physical Exam Constitutional:      General: She is in acute distress.     Appearance: She is obese.  HENT:     Head: Normocephalic and atraumatic.     Right Ear: Tympanic membrane, ear canal and external ear normal.     Left Ear: Tympanic membrane, ear canal and external ear normal.     Nose: Nose normal.     Mouth/Throat:     Mouth: Mucous membranes are dry.     Pharynx: Oropharynx is clear.  Eyes:     Extraocular Movements: Extraocular movements intact.     Conjunctiva/sclera: Conjunctivae normal.     Pupils: Pupils are equal, round, and reactive to light.  Neck:     Musculoskeletal: Normal range of motion and neck supple.  Cardiovascular:     Rate and Rhythm: Normal rate.     Pulses: Normal pulses.     Heart sounds: Normal heart sounds.  Pulmonary:     Effort: Pulmonary effort is normal.     Breath sounds: Normal breath sounds.  Abdominal:     General: Bowel sounds are normal.     Palpations: Abdomen is soft.  Musculoskeletal: Normal range of motion.  Skin:    General: Skin is warm and dry.  Neurological:     Mental Status: She is alert and oriented to person, place, and time.     Cranial Nerves: No cranial nerve deficit.     Sensory: No sensory deficit.     Motor: Weakness present.     Coordination: Coordination  normal.     Gait: Gait abnormal.     Deep Tendon Reflexes: Reflexes normal.  Psychiatric:        Mood and Affect: Mood normal.        Behavior: Behavior normal.        Thought Content: Thought content normal.        Judgment: Judgment normal.   admit for lumbar fusion L3/4. Risks and benefits explained. She understands and wishes to proceed.

## 2019-07-15 MED ORDER — METHOCARBAMOL 500 MG PO TABS
500.0000 mg | ORAL_TABLET | Freq: Four times a day (QID) | ORAL | Status: DC | PRN
  Administered 2019-07-15 – 2019-07-16 (×3): 500 mg via ORAL
  Filled 2019-07-15 (×3): qty 1

## 2019-07-15 MED ORDER — DIAZEPAM 5 MG PO TABS
5.0000 mg | ORAL_TABLET | Freq: Two times a day (BID) | ORAL | Status: DC | PRN
  Administered 2019-07-15: 5 mg via ORAL
  Filled 2019-07-15: qty 1

## 2019-07-15 NOTE — Evaluation (Signed)
Occupational Therapy Evaluation Patient Details Name: [name] MRN: [id number] DOB: 06-02-1961 Today's Date: 07/15/2019    History of Present Illness Pt is a 58 yo female s/p L3-4 PLIF. PMhx: HTN, Anxiety, fibromyalgia, chronic back pain and tachycardia.   Clinical Impression   Pt PTA: living home alone and independent with AD. Pt currently able to perform figure 4 technique for LB dressing with both legs. Pt able to perform toilet hygiene with BUEs.  OTR explained the AE available and pt denying need for demonstration as pt has hip kit at home. Pt using no AD for mobilizing in room. Back handout provided and reviewed ADL in detail. Pt educated on: clothing between brace, never sleep in brace, set an alarm at night for medication, avoid sitting for long periods of time, correct bed positioning for sleeping, correct sequence for bed mobility, avoiding lifting more than 5 pounds and never wash directly over incision. All education is complete and patient indicates understanding. No further OT skills needed. OT signing off.      Follow Up Recommendations  No OT follow up    Equipment Recommendations  None recommended by OT    Recommendations for Other Services       Precautions / Restrictions Precautions Precautions: Back Precaution Booklet Issued: Yes (comment) Precaution Comments: verbally discussed Required Braces or Orthoses: Spinal Brace Spinal Brace: Other (comment)(did not have it with her) Restrictions Weight Bearing Restrictions: No      Mobility Bed Mobility Overal bed mobility: Needs Assistance Bed Mobility: Sidelying to Sit;Sit to Supine   Sidelying to sit: Supervision   Sit to supine: Supervision   General bed mobility comments: verbal cues for log roll technique  Transfers Overall transfer level: Modified independent               General transfer comment: has SPC    Balance Overall balance assessment: No apparent balance deficits (not  formally assessed)                                         ADL either performed or assessed with clinical judgement   ADL Overall ADL's : At baseline;Modified independent                                       General ADL Comments: Pt able to perform figure 4 technique for LB dressing with both legs. Pt able to perform toilet hygiene with BUEs.  OTR explained the AE available and pt denying need for demonstration as pt has hip kit at home.     Vision Baseline Vision/History: No visual deficits Vision Assessment?: No apparent visual deficits     Perception     Praxis      Pertinent Vitals/Pain Pain Assessment: Faces Faces Pain Scale: Hurts little more Pain Location: low back Pain Descriptors / Indicators: Discomfort Pain Intervention(s): Limited activity within patient's tolerance     Hand Dominance Right   Extremity/Trunk Assessment Upper Extremity Assessment Upper Extremity Assessment: Overall WFL for tasks assessed   Lower Extremity Assessment Lower Extremity Assessment: Generalized weakness   Cervical / Trunk Assessment Cervical / Trunk Assessment: Other exceptions Cervical / Trunk Exceptions: s/p back sx   Communication Communication Communication: No difficulties   Cognition Arousal/Alertness: Awake/alert Behavior During Therapy: WFL for tasks assessed/performed Overall Cognitive Status:  Within Functional Limits for tasks assessed                                     General Comments       Exercises     Shoulder Instructions      Home Living Family/patient expects to be discharged to:: Private residence Living Arrangements: Alone Available Help at Discharge: Family;Available 24 hours/day Type of Home: House Home Access: Stairs to enter [employer] of Steps: 5 Entrance Stairs-Rails: Can reach both Home Layout: One level     Bathroom Shower/Tub: Teacher, early years/pre:  Handicapped height     Home Equipment: Cane - single point;Bedside commode;Shower seat          Prior Functioning/Environment Level of Independence: Independent with assistive device(s)                 OT Problem List: Decreased activity tolerance      OT Treatment/Interventions:      OT Goals(Current goals can be found in the care plan section)    OT Frequency:     Barriers to D/C:            Co-evaluation              AM-PAC OT "6 Clicks" Daily Activity     Outcome Measure Help from another person eating meals?: None Help from another person taking care of personal grooming?: None Help from another person toileting, which includes using toliet, bedpan, or urinal?: None Help from another person bathing (including washing, rinsing, drying)?: A Little Help from another person to put on and taking off regular upper body clothing?: None Help from another person to put on and taking off regular lower body clothing?: None 6 Click Score: 23   End of Session Nurse Communication: Mobility status  Activity Tolerance: Patient tolerated treatment well Patient left: in bed;with call bell/phone within reach  OT Visit Diagnosis: Unsteadiness on feet (R26.81);Pain Pain - part of body: (back)                Time: 4944-9675 OT Time Calculation (min): 25 min Charges:  OT General Charges $OT Visit: 1 Visit OT Evaluation $OT Eval Moderate Complexity: 1 Mod OT Treatments $Self Care/Home Management : 8-22 mins  Ebony Hail Harold Hedge) Marsa Aris OTR/L Acute Rehabilitation Services Pager: [phone] Office: [place] 07/15/2019, 8:18 AM

## 2019-07-15 NOTE — Evaluation (Signed)
Physical Therapy Evaluation Patient Details Name: [name] MRN: [id number] DOB: 30-Sep-1961 Today's Date: 07/15/2019   History of Present Illness  Pt is a 58 yo female s/p L3-4 PLIF. PMhx: HTN, Anxiety, fibromyalgia, chronic back pain and tachycardia.  Clinical Impression  Pt admitted with above diagnosis. At the time of PT eval, pt was able to demonstrate transfers and ambulation with up to min assist for balance support and safety. Pt blaming hospital socks on tripping and stumbling in the hallway, however did not do much better with shoes donned. Pt was educated on precautions, brace application/wearing schedule (verbal instruction as pt did not have brace with her), appropriate activity progression, and car transfer. Pt currently with functional limitations due to the deficits listed below (see PT Problem List). Pt will benefit from skilled PT to increase their independence and safety with mobility to allow discharge to the venue listed below.      Follow Up Recommendations No PT follow up;Supervision for mobility/OOB    Equipment Recommendations  None recommended by PT    Recommendations for Other Services       Precautions / Restrictions Precautions Precautions: Back Precaution Booklet Issued: Yes (comment) Precaution Comments: verbally discussed Required Braces or Orthoses: Spinal Brace Spinal Brace: Other (comment)(did not have it with her) Restrictions Weight Bearing Restrictions: No      Mobility  Bed Mobility Overal bed mobility: Needs Assistance Bed Mobility: Sidelying to Sit;Sit to Supine   Sidelying to sit: Supervision   Sit to supine: Supervision   General bed mobility comments: verbal cues for log roll technique. Pt not wanting to fully complete log roll and is not willing to try a different way to get back in the bed outside of picking her legs up with her arms into a long sitting position, then laying down in bed.   Transfers Overall transfer level:  Needs assistance Equipment used: Straight cane Transfers: Sit to/from Stand Sit to Stand: Supervision         General transfer comment: Close supervision for safety as pt required increased effort to power-up to full stand.   Ambulation/Gait Ambulation/Gait assistance: Min guard;Min assist Gait Distance (Feet): 200 Feet Assistive device: Straight cane Gait Pattern/deviations: Step-through pattern;Decreased stride length;Drifts right/left Gait velocity: Decreased Gait velocity interpretation: <1.8 ft/sec, indicate of risk for recurrent falls General Gait Details: Pt with slow and guarded gait with large range hip rotation on the L. Pt with poor use of SPC but not willing to make corrective changes or turn the cane around so it was facing the correct direction. Educated pt on safety but she states she is "used to it this way". Occasional   Stairs Stairs: Yes Stairs assistance: Min guard Stair Management: One rail Right;With cane;Step to pattern Number of Stairs: 5 General stair comments: VC's for sequencing and general safety. Pt was educated on safe stair negotiation with and without SPC. Pt with poor use of cane so prefer she would use railings for support instead.   Wheelchair Mobility    Modified Rankin (Stroke Patients Only)       Balance Overall balance assessment: No apparent balance deficits (not formally assessed)                                           Pertinent Vitals/Pain Pain Assessment: Faces Faces Pain Scale: Hurts even more Pain Location: low back Pain Descriptors /  Indicators: Burning;Operative site guarding Pain Intervention(s): Monitored during session    [name] expects to be discharged to:: Private residence Living Arrangements: Alone Available Help at Discharge: Family;Available 24 hours/day Type of Home: House Home Access: Stairs to enter Entrance Stairs-Rails: Can reach both Entrance Stairs-Number of  Steps: 5 Home Layout: One level Home Equipment: Cane - single point;Bedside commode;Shower seat      Prior Function Level of Independence: Independent with assistive device(s)               Hand Dominance   Dominant Hand: Right    Extremity/Trunk Assessment   Upper Extremity Assessment Upper Extremity Assessment: Overall WFL for tasks assessed    Lower Extremity Assessment Lower Extremity Assessment: Generalized weakness    Cervical / Trunk Assessment Cervical / Trunk Assessment: Other exceptions Cervical / Trunk Exceptions: s/p back sx  Communication   Communication: No difficulties  Cognition Arousal/Alertness: Awake/alert Behavior During Therapy: WFL for tasks assessed/performed Overall Cognitive Status: Within Functional Limits for tasks assessed                                        General Comments      Exercises     Assessment/Plan    PT Assessment Patient needs continued PT services  PT Problem List Decreased strength;Decreased activity tolerance;Decreased balance;Decreased mobility;Decreased knowledge of use of DME;Decreased safety awareness;Decreased knowledge of precautions;Pain       PT Treatment Interventions DME instruction;Gait training;Functional mobility training;Therapeutic exercise;Stair training;Neuromuscular re-education;Therapeutic activities;Patient/family education    PT Goals (Current goals can be found in the Care Plan section)  Acute Rehab PT Goals Patient Stated Goal: Return home at d/c PT Goal Formulation: With patient Time For Goal Achievement: 07/22/19 Potential to Achieve Goals: Good    Frequency Min 5X/week   Barriers to discharge        Co-evaluation               AM-PAC PT "6 Clicks" Mobility  Outcome Measure Help needed turning from your back to your side while in a flat bed without using bedrails?: None Help needed moving from lying on your back to sitting on the side of a flat bed  without using bedrails?: A Little Help needed moving to and from a bed to a chair (including a wheelchair)?: A Little Help needed standing up from a chair using your arms (e.g., wheelchair or bedside chair)?: A Little Help needed to walk in hospital room?: A Little Help needed climbing 3-5 steps with a railing? : A Little 6 Click Score: 19    End of Session Equipment Utilized During Treatment: Gait belt Activity Tolerance: Patient limited by fatigue;Patient limited by pain Patient left: in bed;with call bell/phone within reach Nurse Communication: Mobility status PT Visit Diagnosis: Unsteadiness on feet (R26.81);Pain Pain - part of body: (back)    Time: 6962-9528 PT Time Calculation (min) (ACUTE ONLY): 40 min   Charges:   PT Evaluation $PT Eval Moderate Complexity: 1 Mod PT Treatments $Gait Training: 23-37 mins        Rolinda Roan, PT, DPT Acute Rehabilitation Services Pager: [phone] Office: [phone]   Thelma Comp 07/15/2019, 1:16 PM

## 2019-07-16 MED ORDER — POLYETHYLENE GLYCOL 3350 17 G PO PACK
17.0000 g | PACK | Freq: Every day | ORAL | Status: DC
  Administered 2019-07-16: 17 g via ORAL
  Filled 2019-07-16: qty 1

## 2019-07-16 NOTE — Discharge Instructions (Signed)
Spinal Fusion °Care After °Refer to this sheet in the next few weeks. These instructions provide you with information on caring for yourself after your procedure. Your caregiver may also give you more specific instructions. Your treatment has been planned according to current medical practices, but problems sometimes occur. Call your caregiver if you have any problems or questions after your procedure. °HOME CARE INSTRUCTIONS  °· Take whatever pain medicine has been prescribed by your caregiver. Do not take over-the-counter pain medicine unless directed otherwise by your caregiver.  °· Do not drive if you are taking narcotic pain medicines.  °· Change your bandage (dressing) if necessary or as directed by your caregiver.  °· You may shower. The wound may get wet, simply pat the area dry. It will take ~2 weeks for the glue to peel off. °· If you have been prescribed medicine to prevent your blood from clotting, follow the directions carefully.  °· Check the area around your incision often. Look for redness and swelling. Also, look for anything leaking from your wound. You can use a mirror or have a family member inspect your incision if it is in a place where it is difficult for you to see.  °· Ask your caregiver what activities you should avoid and for how long.  °· Walk as much as possible.  °· Do not lift anything heavier than 5 lbs until your caregiver says it is safe.  °· Do not twist or bend for a few weeks. Try not to pull on things. Avoid sitting for long periods of time. Change positions at least every hour.  °

## 2019-07-16 NOTE — Discharge Summary (Signed)
Physician Discharge Summary  Patient ID: [name] MRN: [id number] DOB/AGE: [birth year] 58 y.o.  Admit date: 07/14/2019 Discharge date: 07/16/2019  Admission Diagnoses: Lumbar spondylolisthesis and spinal stenosis L3/4  Discharge Diagnoses:  Active Problems:   Spondylolisthesis of lumbar region   Discharged Condition: good  Hospital Course: [name] was admitted and taken to the operating room for an uncomplicated lumbar fusion and decompression at L3/4. Post op she has done very well, her wound is clean, dry, and without signs of infection. She is voiding, ambulating and tolerating a regular diet.   Treatments: surgery: PLIF L3/4, extension of hardware  Discharge Exam: Blood pressure (!) 118/59, pulse 77, temperature 98 F (36.7 C), temperature source Oral, resp. rate 16, height 5' 3.5" (1.613 m), weight 86.2 kg, last menstrual period 02/18/2016, SpO2 95 %. Neurologic: Grossly normal  Disposition: Discharge disposition: 01-Home or Self Care      Spinal stenosis, lumbar region with neurogenic claudication  Allergies as of 07/16/2019      Reactions   Monosodium Glutamate Anaphylaxis, Swelling   Eyes swollen shut, facial swelling, tongue swelling.   Shellfish Allergy Anaphylaxis   Celebrex [celecoxib] Itching   Only allergic to generic brand   Contrast Media [iodinated Diagnostic Agents] Itching, Nausea Only   "could not walk"   Diclofenac Itching   Can take the name brand Voltaren   Other Other (See Comments)   Pet dander, Mildew, Mold Cannot tolerate generic DICLOFENAC GEL--MUST USE BRAND NAME (GENERIC CAUSES ITCHING)   Latex Itching, Rash   Latex glove with powder      Medication List    STOP taking these medications   cyclobenzaprine 10 MG tablet Commonly known as: FLEXERIL   ibuprofen 200 MG tablet Commonly known as: ADVIL     TAKE these medications   albuterol 108 (90 Base) MCG/ACT inhaler Commonly known as: VENTOLIN HFA Inhale 2 puffs into  the lungs every 6 (six) hours as needed for wheezing or shortness of breath.   Amitiza 24 MCG capsule Generic drug: lubiprostone Take 24 mcg by mouth 2 (two) times daily with a meal.   aspirin EC 81 MG tablet Take 81 mg by mouth daily.   atenolol 50 MG tablet Commonly known as: TENORMIN Take 50 mg by mouth daily.   CALCIUM-VITAMIN D PO Take 1 tablet by mouth 2 (two) times daily.   CeleBREX 200 MG capsule Generic drug: celecoxib Take 200 mg by mouth 2 (two) times daily.   chlorzoxazone 500 MG tablet Commonly known as: PARAFON Take 500 mg by mouth 3 (three) times daily.   diazepam 5 MG tablet Commonly known as: VALIUM Take 5 mg by mouth 2 (two) times daily.   diclofenac sodium 1 % Gel Commonly known as: VOLTAREN Apply 1 application topically 2 (two) times daily as needed (shoulder, arm and leg pain). For right shoulder and arm and legs What changed:   how much to take  when to take this  additional instructions   diphenhydrAMINE 25 MG tablet Commonly known as: BENADRYL Take 50 mg by mouth every 6 (six) hours as needed for allergies.   diphenhydramine-acetaminophen 25-500 MG Tabs tablet Commonly known as: TYLENOL PM Take 2 tablets by mouth at bedtime as needed (sleep/pain).   furosemide 20 MG tablet Commonly known as: LASIX Take 20 mg by mouth daily.   gabapentin 600 MG tablet Commonly known as: NEURONTIN Take 1,200 mg by mouth 3 (three) times daily.   hydrochlorothiazide 25 MG tablet Commonly known as: HYDRODIURIL Take  1 tablet (25 mg total) by mouth daily. One tablet by mouth in the morning. What changed: additional instructions   hydrOXYzine 50 MG tablet Commonly known as: ATARAX/VISTARIL Take 50 mg by mouth 2 (two) times daily.   methocarbamol 500 MG tablet Commonly known as: ROBAXIN Take 500 mg by mouth 4 (four) times daily.   multivitamin with minerals Tabs tablet Take 1 tablet by mouth daily.   omeprazole 20 MG capsule Commonly known as:  PRILOSEC Take 20 mg by mouth 2 (two) times daily before a meal.   ondansetron 8 MG disintegrating tablet Commonly known as: ZOFRAN-ODT Take 8 mg by mouth 2 (two) times daily as needed for nausea or vomiting.   OVER THE COUNTER MEDICATION Take 1 tablet by mouth daily. Neuriva Brain Supplement   oxyCODONE-acetaminophen 10-325 MG tablet Commonly known as: Percocet 1 tablet if needed for pain. What changed:   how much to take  how to take this  when to take this  additional instructions   potassium chloride 10 MEQ tablet Commonly known as: KLOR-CON Take 10 mEq by mouth daily.   senna 8.6 MG Tabs tablet Commonly known as: SENOKOT Take 1 tablet by mouth 2 (two) times daily.   tiZANidine 4 MG tablet Commonly known as: Zanaflex Take 1 tablet (4 mg total) by mouth every 6 (six) hours as needed for muscle spasms.        Signed: Andreu Drudge L 07/16/2019, 12:32 PM

## 2019-07-16 NOTE — Progress Notes (Signed)
Physical Therapy Treatment Patient Details Name: [name] MRN: [id number] DOB: Dec 02, 1960 Today's Date: 07/16/2019    History of Present Illness Pt is a 58 yo female s/p L3-4 PLIF. PMhx: HTN, Anxiety, fibromyalgia, chronic back pain and tachycardia.    PT Comments    Pt progressing well with post-op mobility. She was able to demonstrate transfers and ambulation with gross min guard assist, and min assist for stair training. Pt was educated on precautions, brace application/wearing schedule (verbally reviewed as pt does not have brace with her), appropriate activity progression, and car transfer. Will continue to follow.    Follow Up Recommendations  No PT follow up;Supervision for mobility/OOB     Equipment Recommendations  None recommended by PT    Recommendations for Other Services       Precautions / Restrictions Precautions Precautions: Back Precaution Booklet Issued: Yes (comment) Precaution Comments: verbally discussed Required Braces or Orthoses: Spinal Brace Spinal Brace: Other (comment)(did not have it with her) Restrictions Weight Bearing Restrictions: No    Mobility  Bed Mobility               General bed mobility comments: Pt was received sitting up on EOB when PT arrived.   Transfers Overall transfer level: Needs assistance Equipment used: Straight cane;None Transfers: Sit to/from Stand Sit to Stand: Supervision         General transfer comment: Pt held cane in hand but stood without utilizing it for support. Increased time and effort to achieve full stand.   Ambulation/Gait Ambulation/Gait assistance: Min guard Gait Distance (Feet): 200 Feet Assistive device: Straight cane Gait Pattern/deviations: Step-through pattern;Decreased stride length;Drifts right/left Gait velocity: Decreased Gait velocity interpretation: <1.8 ft/sec, indicate of risk for recurrent falls General Gait Details: Pt with slow and guarded gait with occasional use  of SPC for support. Pt continues with poor use of SPC but not willing to make corrective changes or turn the cane around so it was facing the correct direction.    Stairs Stairs: Yes Stairs assistance: Min guard;Min assist Stair Management: One rail Right;With cane;Step to pattern Number of Stairs: 10 General stair comments: 1 instance of min assist for support at end of ascending flight. VC's for sequencing and general safety. Pt was educated on safe stair negotiation with and without SPC. Pt with poor use of cane so prefer she would use railings for support instead.    Wheelchair Mobility    Modified Rankin (Stroke Patients Only)       Balance Overall balance assessment: Mild deficits observed, not formally tested                                          Cognition Arousal/Alertness: Awake/alert Behavior During Therapy: WFL for tasks assessed/performed Overall Cognitive Status: Within Functional Limits for tasks assessed                                        Exercises      General Comments        Pertinent Vitals/Pain Pain Assessment: Faces Faces Pain Scale: Hurts little more Pain Location: low back Pain Descriptors / Indicators: Burning;Operative site guarding Pain Intervention(s): Limited activity within patient's tolerance;Monitored during session;Repositioned    Home Living  Prior Function            PT Goals (current goals can now be found in the care plan section) Acute Rehab PT Goals Patient Stated Goal: Return home at d/c PT Goal Formulation: With patient Time For Goal Achievement: 07/22/19 Potential to Achieve Goals: Good Progress towards PT goals: Progressing toward goals    Frequency    Min 5X/week      PT Plan Current plan remains appropriate    Co-evaluation              AM-PAC PT "6 Clicks" Mobility   Outcome Measure  Help needed turning from your back to your  side while in a flat bed without using bedrails?: None Help needed moving from lying on your back to sitting on the side of a flat bed without using bedrails?: A Little Help needed moving to and from a bed to a chair (including a wheelchair)?: A Little Help needed standing up from a chair using your arms (e.g., wheelchair or bedside chair)?: A Little Help needed to walk in hospital room?: A Little Help needed climbing 3-5 steps with a railing? : A Little 6 Click Score: 19    End of Session Equipment Utilized During Treatment: Gait belt Activity Tolerance: Patient limited by fatigue;Patient limited by pain Patient left: in chair;with call bell/phone within reach Nurse Communication: Mobility status PT Visit Diagnosis: Unsteadiness on feet (R26.81);Pain Pain - part of body: (back)     Time: 3329-5188 PT Time Calculation (min) (ACUTE ONLY): 35 min  Charges:  $Gait Training: 23-37 mins                     [name], PT, DPT Acute Rehabilitation Services Pager: [phone] Office: [phone]    [name] 07/16/2019, 11:40 AM

## 2019-07-16 NOTE — Op Note (Signed)
07/14/2019  12:20 PM  PATIENT:  [name]  58 y.o. female with lumbar stenosis at L3/4 adjacent to the fused segment at L4-S1. I have recommended decompression and fusion at L3/4  PRE-OPERATIVE DIAGNOSIS:  Spinal stenosis, lumbar region with neurogenic claudication L3/4  POST-OPERATIVE DIAGNOSIS:  Spinal stenosis, lumbar region with neurogenic claudication L3/4  PROCEDURE:  Procedure(s): Lumbar three-four Posterior lumbar interbody fusion with extension to the existing hardware, Conduit cages with autograft morsels Segmental fixation with pedicle screws and connectors SURGEON:  Surgeon(s): Ashok Pall, MD Eustace Moore, MD  ASSISTANTS:Jones, Shanon Brow  ANESTHESIA:   general  EBL:  No intake/output data recorded.  BLOOD ADMINISTERED:none  CELL SAVER GIVEN:none  COUNT:per nursing  DRAINS: none   SPECIMEN:  No Specimen  DICTATION: [name] is a 58 y.o. female whom was taken to the operating room intubated, and placed under a general anesthetic without difficulty. A foley catheter was placed under sterile conditions. [name] was positioned prone on a Jackson stable with all pressure points properly padded.  Her lumbar region was prepped and draped in a sterile manner. I infiltrated 20cc's 1/2%lidocaine/1:2000,000 strength epinephrine into the planned incision. I opened the skin with a 10 blade and took the incision down to the thoracolumbar fascia. I exposed the lamina of L2,3, and 4 in a subperiosteal fashion bilaterally. I exposed the hardware from the previous arthrodesisI confirmed my location with an intraoperative xray.  I placed self retaining retractors and started the decompression.  I decompressed the spinal canal via a complete laminectomy of L3, and inferior facetectomies of L3. This was in excess of the needed exposure for a plif. I used the drill, and Kerrison punches to remove the bone. PLIF's were performed at L3/4 in the same fashion. I opened the disc  space with a 15 blade then used a variety of instruments to remove the disc and prepare the space for the arthrodesis. I used curettes, rongeurs, punches, shavers for the disc space, and rasps in the discetomy. I measured the disc space and placed Conduit  Titanium cages(Synthes) packed with autograft morsels into the disc space(s).  I decorticated the lateral bone at L3 and L4. I then placed allograft morsels on the decorticated surfaces to complete the posterolateral arthrodesis.  I placed pedicle screws at L3, using fluoroscopic guidance. I drilled a pilot hole, then cannulated the pedicle with a drill at each site. I then tapped each pedicle, assessing each site for pedicle violations. No cutouts were appreciated. Screws Harlin Heys) were then placed at each site without difficulty. I removed the L4 screw on the right side.  I attached rods and locking caps with the appropriate tools. The locking caps were secured with torque limited screwdrivers. Final films were performed and the final construct appeared to be in good position.  I closed the wound in a layered fashion. I approximated the thoracolumbar fascia, subcutaneous, and subcuticular planes with vicryl sutures. I used dermabond and an occlusive bandage for a sterile dressing.     PLAN OF CARE: Admit to inpatient   PATIENT DISPOSITION:  PACU - hemodynamically stable.   Delay start of Pharmacological VTE agent (>24hrs) due to surgical blood loss or risk of bleeding:  yes

## 2019-07-16 NOTE — Progress Notes (Signed)
Discharged instructions/education/AVS/Rx given to patient and verbalized understanding. Patient ambulating well with cane, MAE well. Voiding and emptying bladder well. Dressing CDI, no drainage, no swelling, no redness noted on incision site. Pain is tolerable and controlled by PRN medications per patient. Awaiting for transport.

## 2019-08-17 ENCOUNTER — Other Ambulatory Visit: Payer: Self-pay | Admitting: Family Medicine

## 2019-08-17 DIAGNOSIS — Z1231 Encounter for screening mammogram for malignant neoplasm of breast: Secondary | ICD-10-CM

## 2019-10-11 ENCOUNTER — Other Ambulatory Visit: Payer: Self-pay

## 2019-10-11 ENCOUNTER — Ambulatory Visit: Payer: Medicare Other

## 2019-10-11 ENCOUNTER — Ambulatory Visit
Admission: RE | Admit: 2019-10-11 | Discharge: 2019-10-11 | Disposition: A | Discharge status: Home / Self Care | Payer: Medicare Other | Source: Ambulatory Visit | Attending: Family Medicine | Admitting: Family Medicine

## 2019-10-11 DIAGNOSIS — Z1231 Encounter for screening mammogram for malignant neoplasm of breast: Secondary | ICD-10-CM

## 2019-11-18 ENCOUNTER — Ambulatory Visit: Payer: Medicare Other | Admitting: Physical Therapy

## 2019-12-02 ENCOUNTER — Ambulatory Visit: Payer: Medicare Other | Attending: Neurosurgery | Admitting: Physical Therapy

## 2019-12-02 ENCOUNTER — Other Ambulatory Visit: Payer: Self-pay

## 2019-12-02 ENCOUNTER — Encounter: Payer: Self-pay | Admitting: Physical Therapy

## 2019-12-02 DIAGNOSIS — M25511 Pain in right shoulder: Secondary | ICD-10-CM | POA: Insufficient documentation

## 2019-12-02 DIAGNOSIS — G5623 Lesion of ulnar nerve, bilateral upper limbs: Secondary | ICD-10-CM | POA: Diagnosis present

## 2019-12-02 DIAGNOSIS — M6281 Muscle weakness (generalized): Secondary | ICD-10-CM | POA: Insufficient documentation

## 2019-12-02 DIAGNOSIS — R208 Other disturbances of skin sensation: Secondary | ICD-10-CM

## 2019-12-02 DIAGNOSIS — M542 Cervicalgia: Secondary | ICD-10-CM | POA: Insufficient documentation

## 2019-12-02 DIAGNOSIS — R209 Unspecified disturbances of skin sensation: Secondary | ICD-10-CM | POA: Insufficient documentation

## 2019-12-03 NOTE — Therapy (Signed)
[place], [place], [address] Phone: [phone]   Fax:  [phone]  Physical Therapy Evaluation  Patient Details  Name: [name] MRN: [id number] Date of Birth: 1961/02/28 Referring Provider (PT): Dr. Ashok Pall    Encounter Date: 12/02/2019  PT End of Session - 12/02/19 1959    Visit Number  1    Number of Visits  16    Date for PT Re-Evaluation  01/27/20    Authorization Type  [employer] MCR    Progress Note Due on Visit  10    PT Start Time  1400    PT Stop Time  1445    PT Time Calculation (min)  45 min    Activity Tolerance  Patient tolerated treatment well    Behavior During Therapy  [facility] for tasks assessed/performed       Past Medical History:  Diagnosis Date  . Anemia   . Anemia   . Ankle syndesmosis disruption   . Anxiety    takes Ativan and Valium, after mother passed  . Arthritis    bilateral knees s/p knee replacement bilaterally  . Asthma    but pt states not severe enough to even have an inhaler  . Bronchitis   . Bruising    pt states unexplained d/t fibromyalgia  . Chronic back pain    2012 tailbone surgery and 3 lower discs.   . Closed fracture of distal end of right fibula and tibia   . Depression    from Fibromyalgia diagnosis; not taking medicine. since 2001  . Dizziness    rarely  . Fibromyalgia    diagnosed 2001  . GERD (gastroesophageal reflux disease)    Prilosec occasionally  . Headache(784.0)    "sinus headaches"  . History of hiatal hernia   . Hypertension    since 2013  . Hyperthyroidism    subclinical, no treatment; thyroid nodules  . IBS (irritable bowel syndrome)   . Impaired memory    states from fibromyalgia  . Insomnia    takes Ambien  . Jones fracture    left foot fifth metatarsal  . Multiple allergies    including latex, pet dander, shellfish, pet dander  . Painful orthopaedic hardware right ankle 05/27/2016  . Seasonal allergies   .  Shortness of breath    with exertion;pt states that its related to her weight  . Sore gums    this is why pt is on Amoxil-only takes for dental work  . Tachycardia   . Thyroid goiter   . Varicose vein    protrudes above skin-per pt;vein popped and bruised;ultrasound done to make sure that there were no clots;noclots were found    Past Surgical History:  Procedure Laterality Date  . ANTERIOR LUMBAR FUSION  09/20/2011   Procedure: ANTERIOR LUMBAR FUSION 1 LEVEL;  Surgeon: Winfield Cunas;  Location: [facility];  Service: Neurosurgery;  Laterality: N/A;  Lumbar five-Sacral One Anterior Lumbar Interbody Fusion /Dr. Early to Approach   . CERVICAL DISC SURGERY     WITH TITANIUM PLATE IN NECK---LEFT SIDE  . CERVICAL SPINE SURGERY  03/24/2019  . CESAREAN SECTION    . fibroidectomy    . FRACTURE SURGERY  05/08/2010   Jones fracture left foot fifth metatarsal  . HARDWARE REMOVAL Right 05/28/2016   Procedure: RIGHT ANKLE HARDWARE REMOVAL;  Surgeon: Elsie Saas, MD;  Location: [facility];  Service: Orthopedics;  Laterality: Right;  . IRRIGATION  AND DEBRIDEMENT KNEE  04/09/2012   Procedure: IRRIGATION AND DEBRIDEMENT KNEE;  Surgeon: Thera Flake., MD;  Location: [facility];  Service: Orthopedics;  Laterality: Right;  . JOINT REPLACEMENT  01-27-2012   left total knee and Right total knee  . KNEE SURGERY  2005   Left knee arthroscopy  . NASAL SEPTOPLASTY W/ TURBINOPLASTY  2007   due to recurrent sinusitis  . ORIF ANKLE FRACTURE Right 06/21/2015   Procedure: OPEN REDUCTION INTERNAL FIXATION RIGHT DISTAL FIBULA  FRACTURE AND OPEN REDUCTION INTERNAL FIXATION SYNDESMOSIS ;  Surgeon: Salvatore Marvel, MD;  Location: [facility];  Service: Orthopedics;  Laterality: Right;  . SHOULDER ARTHROSCOPY W/ ROTATOR CUFF REPAIR Right   . SHOULDER SURGERY Left   . SPINE SURGERY  2004   Cervical plate, ACDF  . STERIOD INJECTION  01/27/2012   Procedure: STEROID INJECTION;  Surgeon: Nilda Simmer, MD;  Location[facility];  Service: Orthopedics;  Laterality: Right;  . TONSILLECTOMY  2007  . TOTAL KNEE ARTHROPLASTY  01/27/2012   Procedure: TOTAL KNEE ARTHROPLASTY;  Surgeon: Nilda Simmer, MD;  Bilateral  . TOTAL KNEE ARTHROPLASTY  04/06/2012   Procedure: TOTAL KNEE ARTHROPLASTY;  Surgeon: Nilda Simmer, MD;  Location: [facility];  Service: Orthopedics;  Laterality: Right;  . TUBAL LIGATION    . UTERINE FIBROID SURGERY     mid 200s    There were no vitals filed for this visit.   Subjective Assessment - 12/02/19 1409    Subjective  Pt underwent spinal surgeries this year. After she had neck surgery she noticed she had tightness and pinching in RUE, a popped out vein in her Rt elbow crease. She has had shooting pain with straining for BM.   She has tingling in fingers.  Feels off balance.  Symptoms prior to surgery were on L side, now her Rt side feels the same way.   She has pain and tighness in her shoulder blades, feels weak.  It is difficult for her get dressed, bathing, gripping and opening things.  She drops things without knowing.    Pertinent History  Lumbar fusion, cervical fusion TKA, falls , chronic pain, multiple medical issues, disabled    Limitations  Lifting;House hold activities;Other (comment);Standing;Walking    Diagnostic tests  Nerve conduction test, results unavailable.    Patient Stated Goals  get my strength back.    Currently in Pain?  Yes    Pain Score  10-Worst pain ever   goal is 5/10   Pain Location  Neck   scapula   Pain Orientation  Right    Pain Descriptors / Indicators  Aching;Tightness    Pain Type  Chronic pain    Pain Radiating Towards  Rt hand, fingers    Pain Onset  More than a month ago    Pain Frequency  Constant    Aggravating Factors   pain is constant but moving makes it worse    Pain Relieving Factors  nothing    Effect of Pain on Daily Activities  Patient is limited in self care and all mobility tasks    Multiple Pain Sites  Yes    knees but not rated        [facility] PT Assessment - 12/02/19 0001      Assessment   Medical Diagnosis  bilateral ulnar neuropathy    Referring Provider (PT)  Dr. Coletta Memos     Onset Date/Surgical Date  --   03/2019  Hand Dominance  Right    Prior Therapy  No       Precautions   Precautions  Fall      Restrictions   Weight Bearing Restrictions  No      Balance Screen   Has the patient fallen in the past 6 months  No    How many times?  0   2 times prior to neck surgery    Has the patient had a decrease in activity level because of a fear of falling?   Yes    Is the patient reluctant to leave their home because of a fear of falling?   Yes      Home Environment   Living Environment  Private residence    Living Arrangements  Alone    Type of Home  House    Home Access  Stairs to enter    Entrance Stairs-Number of Steps  4    Entrance Stairs-Rails  Right    Home Equipment  Walker - 4 wheels      Prior Function   Level of Independence  Independent with basic ADLs;Independent with household mobility with device;Independent with community mobility with device    Vocation  On disability      Cognition   Overall Cognitive Status  Within Functional Limits for tasks assessed      Observation/Other Assessments   Focus on Therapeutic Outcomes (FOTO)   NT       Sensation   Light Touch  Appears Intact      Coordination   Gross Motor Movements are Fluid and Coordinated  Not tested      Posture/Postural Control   Posture/Postural Control  Postural limitations    Postural Limitations  Rounded Shoulders;Forward head;Increased thoracic kyphosis    Posture Comments  guarded in UEs, neck       AROM   AROM Assessment Site  --   pain with all cervical motions    Right Shoulder Flexion  75 Degrees    Right Shoulder ABduction  80 Degrees    Right Shoulder Internal Rotation  --   Rt hip    Left Shoulder Flexion  90 Degrees    Left Shoulder ABduction  90 Degrees    Cervical  Flexion  50    Cervical Extension  68    Cervical - Right Side Bend  35    Cervical - Left Side Bend  40    Cervical - Right Rotation  lacks 10 deg     Cervical - Left Rotation  lacks 10 deg       PROM   Overall PROM Comments  UE WFL in passive ROM      Strength   Overall Strength Comments  L grip 38 lbs Rt 40 lbs     Right Shoulder Flexion  3-/5    Right Shoulder Internal Rotation  4/5    Right Shoulder External Rotation  3-/5    Left Shoulder Flexion  3+/5    Left Shoulder Internal Rotation  4/5    Left Shoulder External Rotation  3+/5      Palpation   Palpation comment  gross pain with palpation across upper and middle back, tender Rt UE, medial epicondyle and cubital fossa      Ambulation/Gait   Ambulation Distance (Feet)  100 Feet    Assistive device  Straight cane    Gait Pattern  Antalgic  Objective measurements completed on examination: See above findings.      [facility] Adult PT Treatment/Exercise - 12/02/19 0001      Self-Care   Self-Care  Other Self-Care Comments    Other Self-Care Comments   PT/POC, symptom mgmt, pain scale, putty for hands             PT Education - 12/02/19 1958    Education Details  PT/POC, self care, ROM and eval findings    Person(s) Educated  Patient    Methods  Explanation;Verbal cues    Comprehension  Verbalized understanding;Need further instruction       PT Short Term Goals - 12/02/19 2005      PT SHORT TERM GOAL #1   Title  Pt will be I with initial HEP for reduction of symptoms    Time  3    Period  Weeks    Status  New    Target Date  12/23/19      PT SHORT TERM GOAL #2   Title  Pt will report 25% less symptoms in Rt UE to allow improved grip, fine motor skills    Time  3    Period  Weeks    Status  New    Target Date  12/23/19      PT SHORT TERM GOAL #3   Title  Pt will be able to report min pain with AROM of cervical spine    Time  3    Period  Weeks    Status  New    Target Date   12/23/19      PT SHORT TERM GOAL #4   Title  Pt will complete balance screen and set goal    Time  4    Period  Weeks    Status  New    Target Date  12/30/19        PT Long Term Goals - 12/02/19 2011      PT LONG TERM GOAL #1   Title  Pt will be able to open jars and containers with min occasional difficulty    Time  8    Period  Weeks    Status  New    Target Date  01/27/20      PT LONG TERM GOAL #2   Title  Pt will be able to get dressed, complete ADLs more efficiently, no rest breaks    Time  8    Period  Weeks    Status  New    Target Date  01/27/20      PT LONG TERM GOAL #3   Title  Pt will be able to reach overhead with bilateral UEs to 120 deg or more    Baseline  90 deg    Time  8    Period  Weeks    Status  New    Target Date  01/27/20      PT LONG TERM GOAL #4   Title  pt will increase UE strength to 4 /5 in bilateral shoulder flexion/abduction    Time  8    Period  Weeks    Status  New    Target Date  01/27/20      PT LONG TERM GOAL #5   Title  balance score TBA if warranted.    Time  8    Period  Weeks    Status  New    Target Date  01/27/20  Additional Long Term Goals   Additional Long Term Goals  Yes      PT LONG TERM GOAL #6   Title  Grip strength will improve by 10 lbs in each UE    Time  8    Period  Weeks    Status  New    Target Date  01/27/20             Plan - 12/02/19 1959    Clinical Impression Statement  This patient presents for high complexity eval of bilateral arm and neck pain due to ulnar neuropathy and post cervical spinal fusion.  She has decreased grip strength, weakness in bilateral shoulders and excess muscle tension in neck and upper back.  Due other medical issues she has impaired balance and overall stiffness in spine,hips and knees.  She has not fallen recently but has in the recent year.  Pain is elevated and is rarely below a 8/10.  Expect  limited progress due to the multitude of co-morbidities.     Personal Factors and Comorbidities  Comorbidity 1;Comorbidity 3+;Comorbidity 2    Comorbidities  cervical fusion, lumbar fusion, TKA, obesity, ankle and shoulder surgeries, fibromyalgia    Examination-Activity Limitations  Bathing;Hygiene/Grooming;Squat;Bed Mobility;Lift;Stairs;Stand;Bend;Locomotion Level;Transfers;Sit;Dressing;Sleep    Examination-Participation Restrictions  Shop;Community Activity    Stability/Clinical Decision Making  Unstable/Unpredictable    Clinical Decision Making  High    Rehab Potential  Good    PT Frequency  2x / week    PT Duration  8 weeks    PT Treatment/Interventions  ADLs/Self Care Home Management;Electrical Stimulation;Therapeutic activities;Patient/family education;Taping;Therapeutic exercise;Cryotherapy;Functional mobility training;Neuromuscular re-education;Manual techniques;Passive range of motion;Balance training;Moist Heat    PT Next Visit Plan  develop HEP, ulnar nerve glides , scapular mobility and gentle AROM, manual    PT Home Exercise Plan  none given, just putty    Consulted and Agree with Plan of Care  Patient       Patient will benefit from skilled therapeutic intervention in order to improve the following deficits and impairments:  Decreased activity tolerance, Decreased balance, Difficulty walking, Impaired flexibility, Obesity, Impaired UE functional use, Decreased strength, Decreased range of motion, Decreased endurance, Postural dysfunction, Decreased mobility, Dizziness, Increased fascial restricitons, Impaired sensation, Pain, Hypomobility, Improper body mechanics, Increased edema  Visit Diagnosis: Ulnar neuropathy of both upper extremities  Cervicalgia  Right shoulder pain, unspecified chronicity  Other disturbances of skin sensation  Muscle weakness (generalized)     Problem List Patient Active Problem List   Diagnosis Date Noted  . Spondylolisthesis of lumbar region 10/30/2017  . Painful orthopaedic hardware right ankle  05/27/2016  . Closed fracture of distal end of right fibula and tibia   . Ankle syndesmosis disruption   . ASCUS favor benign 01/06/2014  . Leg swelling 12/22/2013  . Seasonal allergies   . Anemia   . Chronic back pain due to DJD with history surgery   . Hyperthyroidism   . 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 Status post left total prosthetic replacement of knee joint using cement 03/24/2012  . Fibromyalgia   . GERD (gastroesophageal reflux disease)   . Anxiety   . Depression   . Hypertension   . Lumbar degenerative disc disease 09/25/2011    [name] 12/03/2019, 6:09 AM  [facility] [address] [place], [place], [id number] Phone: [phone]   Fax:  [phone]  Name: [name] MRN: [id number] Date of Birth: [birth year]   Karie MainlandJennifer Shereta Crothers,  PT 12/03/19 6:09 AM Phone: [phone] Fax: [phone]

## 2019-12-13 ENCOUNTER — Ambulatory Visit: Payer: Medicare Other | Attending: Neurosurgery | Admitting: Physical Therapy

## 2019-12-13 ENCOUNTER — Encounter: Payer: Self-pay | Admitting: Physical Therapy

## 2019-12-13 ENCOUNTER — Other Ambulatory Visit: Payer: Self-pay

## 2019-12-13 DIAGNOSIS — M6281 Muscle weakness (generalized): Secondary | ICD-10-CM | POA: Insufficient documentation

## 2019-12-13 DIAGNOSIS — M25511 Pain in right shoulder: Secondary | ICD-10-CM | POA: Diagnosis present

## 2019-12-13 DIAGNOSIS — R208 Other disturbances of skin sensation: Secondary | ICD-10-CM

## 2019-12-13 DIAGNOSIS — G5623 Lesion of ulnar nerve, bilateral upper limbs: Secondary | ICD-10-CM

## 2019-12-13 DIAGNOSIS — R209 Unspecified disturbances of skin sensation: Secondary | ICD-10-CM | POA: Diagnosis present

## 2019-12-13 DIAGNOSIS — M542 Cervicalgia: Secondary | ICD-10-CM | POA: Diagnosis present

## 2019-12-13 NOTE — Patient Instructions (Signed)
Access Code: [email or web address]  URL: [email or web address]  Date: 12/13/2019  Prepared by: Karie Mainland   Exercises  Supine Chin Tuck - 10 reps - 2 sets - 5 hold - 2x daily - 7x weekly  Seated Scapular Retraction - 10 reps - 2 sets - 5 hold - 2x daily - 7x weekly  Standing Shoulder External Rotation with Resistance - 10 reps - 2 sets - 5 hold - 2x daily - 7x weekly  Supine Shoulder Press AAROM in Abduction with Dowel - 10 reps - 2 sets - 5 hold - 2x daily - 7x weekly

## 2019-12-13 NOTE — Therapy (Signed)
[place], [place], [address] Phone: [phone]   Fax:  [phone]  Physical Therapy Treatment  Patient Details  Name: [name] MRN: [id number] Date of Birth: 1961-06-22 Referring Provider (PT): Dr. Ashok Pall    Encounter Date: 12/13/2019  PT End of Session - 12/13/19 1012    Visit Number  2    Number of Visits  16    Date for PT Re-Evaluation  01/27/20    Authorization Type  [employer] MCR    PT Start Time  1004    PT Stop Time  1105    PT Time Calculation (min)  61 min    Activity Tolerance  Patient tolerated treatment well    Behavior During Therapy  [facility] for tasks assessed/performed       Past Medical History:  Diagnosis Date  . Anemia   . Anemia   . Ankle syndesmosis disruption   . Anxiety    takes Ativan and Valium, after mother passed  . Arthritis    bilateral knees s/p knee replacement bilaterally  . Asthma    but pt states not severe enough to even have an inhaler  . Bronchitis   . Bruising    pt states unexplained d/t fibromyalgia  . Chronic back pain    2012 tailbone surgery and 3 lower discs.   . Closed fracture of distal end of right fibula and tibia   . Depression    from Fibromyalgia diagnosis; not taking medicine. since 2001  . Dizziness    rarely  . Fibromyalgia    diagnosed 2001  . GERD (gastroesophageal reflux disease)    Prilosec occasionally  . Headache(784.0)    "sinus headaches"  . History of hiatal hernia   . Hypertension    since 2013  . Hyperthyroidism    subclinical, no treatment; thyroid nodules  . IBS (irritable bowel syndrome)   . Impaired memory    states from fibromyalgia  . Insomnia    takes Ambien  . Jones fracture    left foot fifth metatarsal  . Multiple allergies    including latex, pet dander, shellfish, pet dander  . Painful orthopaedic hardware right ankle 05/27/2016  . Seasonal allergies   . Shortness of breath    with exertion;pt  states that its related to her weight  . Sore gums    this is why pt is on Amoxil-only takes for dental work  . Tachycardia   . Thyroid goiter   . Varicose vein    protrudes above skin-per pt;vein popped and bruised;ultrasound done to make sure that there were no clots;noclots were found    Past Surgical History:  Procedure Laterality Date  . ANTERIOR LUMBAR FUSION  09/20/2011   Procedure: ANTERIOR LUMBAR FUSION 1 LEVEL;  Surgeon: Winfield Cunas;  Location: [facility];  Service: Neurosurgery;  Laterality: N/A;  Lumbar five-Sacral One Anterior Lumbar Interbody Fusion /Dr. Early to Approach   . CERVICAL DISC SURGERY     WITH TITANIUM PLATE IN NECK---LEFT SIDE  . CERVICAL SPINE SURGERY  03/24/2019  . CESAREAN SECTION    . fibroidectomy    . FRACTURE SURGERY  05/08/2010   Jones fracture left foot fifth metatarsal  . HARDWARE REMOVAL Right 05/28/2016   Procedure: RIGHT ANKLE HARDWARE REMOVAL;  Surgeon: Elsie Saas, MD;  Location: [facility];  Service: Orthopedics;  Laterality: Right;  . IRRIGATION AND DEBRIDEMENT KNEE  04/09/2012   Procedure: IRRIGATION AND  DEBRIDEMENT KNEE;  Surgeon: Thera Flake., MD;  Location: [facility] OR;  Service: Orthopedics;  Laterality: Right;  . JOINT REPLACEMENT  01-27-2012   left total knee and Right total knee  . KNEE SURGERY  2005   Left knee arthroscopy  . NASAL SEPTOPLASTY W/ TURBINOPLASTY  2007   due to recurrent sinusitis  . ORIF ANKLE FRACTURE Right 06/21/2015   Procedure: OPEN REDUCTION INTERNAL FIXATION RIGHT DISTAL FIBULA  FRACTURE AND OPEN REDUCTION INTERNAL FIXATION SYNDESMOSIS ;  Surgeon: Salvatore Marvel, MD;  Location: [facility];  Service: Orthopedics;  Laterality: Right;  . SHOULDER ARTHROSCOPY W/ ROTATOR CUFF REPAIR Right   . SHOULDER SURGERY Left   . SPINE SURGERY  2004   Cervical plate, ACDF  . STERIOD INJECTION  01/27/2012   Procedure: STEROID INJECTION;  Surgeon: Nilda Simmer, MD;  Location: [facility] OR;  Service:  Orthopedics;  Laterality: Right;  . TONSILLECTOMY  2007  . TOTAL KNEE ARTHROPLASTY  01/27/2012   Procedure: TOTAL KNEE ARTHROPLASTY;  Surgeon: Nilda Simmer, MD;  Bilateral  . TOTAL KNEE ARTHROPLASTY  04/06/2012   Procedure: TOTAL KNEE ARTHROPLASTY;  Surgeon: Nilda Simmer, MD;  Location: [facility];  Service: Orthopedics;  Laterality: Right;  . TUBAL LIGATION    . UTERINE FIBROID SURGERY     mid 200s    There were no vitals filed for this visit.  Subjective Assessment - 12/13/19 1007    Subjective  Right now it is 8/10.  No new complaints.    Pain Score  8     Pain Location  Scapula    Pain Orientation  Right;Left    Pain Descriptors / Indicators  Heaviness;Other (Comment)   pins in arms   Pain Type  Chronic pain    Pain Onset  More than a month ago    Pain Frequency  Constant    Aggravating Factors   activity    Pain Relieving Factors  nothing         [facility] Adult PT Treatment/Exercise - 12/13/19 0001      Shoulder Exercises: Supine   External Rotation  Strengthening;Both;10 reps    Theraband Level (Shoulder External Rotation)  Level 1 (Yellow)    External Rotation Weight (lbs)  2 x 10     External Rotation Limitations       Shoulder Flexion Weight (lbs)  cane x 10 chest press, unable to do overhead     Other Supine Exercises  scapular etraction x 10     Other Supine Exercises  chin tuck x 10 mod cues       Shoulder Exercises: Stretch   Corner Stretch Limitations  doorway, each arm x 5     Wall Stretch - Flexion Limitations  x 5     Other Shoulder Stretches  ulnar nerve flossing, challenging due to Rt shoulder pain and weakness       Modalities   Modalities  Electrical Stimulation;Moist Heat      Moist Heat Therapy   Number Minutes Moist Heat  15 Minutes    Moist Heat Location  Cervical      Electrical Stimulation   Electrical Stimulation Location  upper back     Electrical Stimulation Action  IFC     Electrical Stimulation Parameters  14     Electrical  Stimulation Goals  Pain             PT Education - 12/13/19 1148    Education Details  HEP , IFC, tens unit    Person(s) Educated  Patient    Methods  Explanation;Tactile cues;Demonstration;Handout    Comprehension  Verbalized understanding;Returned demonstration       PT Short Term Goals - 12/02/19 2005      PT SHORT TERM GOAL #1   Title  Pt will be I with initial HEP for reduction of symptoms    Time  3    Period  Weeks    Status  New    Target Date  12/23/19      PT SHORT TERM GOAL #2   Title  Pt will report 25% less symptoms in Rt UE to allow improved grip, fine motor skills    Time  3    Period  Weeks    Status  New    Target Date  12/23/19      PT SHORT TERM GOAL #3   Title  Pt will be able to report min pain with AROM of cervical spine    Time  3    Period  Weeks    Status  New    Target Date  12/23/19      PT SHORT TERM GOAL #4   Title  Pt will complete balance screen and set goal    Time  4    Period  Weeks    Status  New    Target Date  12/30/19        PT Long Term Goals - 12/02/19 2011      PT LONG TERM GOAL #1   Title  Pt will be able to open jars and containers with min occasional difficulty    Time  8    Period  Weeks    Status  New    Target Date  01/27/20      PT LONG TERM GOAL #2   Title  Pt will be able to get dressed, complete ADLs more efficiently, no rest breaks    Time  8    Period  Weeks    Status  New    Target Date  01/27/20      PT LONG TERM GOAL #3   Title  Pt will be able to reach overhead with bilateral UEs to 120 deg or more    Baseline  90 deg    Time  8    Period  Weeks    Status  New    Target Date  01/27/20      PT LONG TERM GOAL #4   Title  pt will increase UE strength to 4 /5 in bilateral shoulder flexion/abduction    Time  8    Period  Weeks    Status  New    Target Date  01/27/20      PT LONG TERM GOAL #5   Title  balance score TBA if warranted.    Time  8    Period  Weeks    Status  New     Target Date  01/27/20      Additional Long Term Goals   Additional Long Term Goals  Yes      PT LONG TERM GOAL #6   Title  Grip strength will improve by 10 lbs in each UE    Time  8    Period  Weeks    Status  New    Target Date  01/27/20            Plan - 12/13/19  1151    Clinical Impression Statement  Patient limited today due to pain and significant UE weakness.  In supine she can only get her arms to shoulder height due to weakness, especially in Rt UE. Able to perform new exercises with min increase in pain.    PT Next Visit Plan  Need to perform ulnar nerve glides in gravity eliminated position., scapular mobility, balance screen and IFC if favorable    PT Home Exercise Plan  putty, chin tucks, scapular retraction, supine cane chest press and overhead, yellow band ER/IR    Consulted and Agree with Plan of Care  Patient       Patient will benefit from skilled therapeutic intervention in order to improve the following deficits and impairments:     Visit Diagnosis: Ulnar neuropathy of both upper extremities  Cervicalgia  Right shoulder pain, unspecified chronicity  Other disturbances of skin sensation  Muscle weakness (generalized)     Problem List Patient Active Problem List   Diagnosis Date Noted  . Spondylolisthesis of lumbar region 10/30/2017  . Painful orthopaedic hardware right ankle 05/27/2016  . Closed fracture of distal end of right fibula and tibia   . Ankle syndesmosis disruption   . ASCUS favor benign 01/06/2014  . Leg swelling 12/22/2013  . Seasonal allergies   . Anemia   . Chronic back pain due to DJD with history surgery   . Hyperthyroidism   . 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 Status post left total prosthetic replacement of knee joint using cement 03/24/2012  . Fibromyalgia   . GERD (gastroesophageal reflux disease)   . Anxiety   . Depression   . Hypertension   . Lumbar degenerative disc disease  09/25/2011    Marrio Scribner 12/13/2019, 11:54 AM  [facility] [address] [place], [name], [address] Phone: [phone]   Fax:  [phone]  Name: [name] MRN: [id number] Date of Birth: Apr 13, 1961  Karie Mainland, PT 12/13/19 11:54 AM Phone: [phone] Fax: [phone]

## 2019-12-15 ENCOUNTER — Ambulatory Visit: Payer: Medicare Other | Admitting: Physical Therapy

## 2019-12-15 ENCOUNTER — Other Ambulatory Visit: Payer: Self-pay

## 2019-12-15 DIAGNOSIS — M6281 Muscle weakness (generalized): Secondary | ICD-10-CM

## 2019-12-15 DIAGNOSIS — R208 Other disturbances of skin sensation: Secondary | ICD-10-CM

## 2019-12-15 DIAGNOSIS — G5623 Lesion of ulnar nerve, bilateral upper limbs: Secondary | ICD-10-CM | POA: Diagnosis not present

## 2019-12-15 DIAGNOSIS — M542 Cervicalgia: Secondary | ICD-10-CM

## 2019-12-15 DIAGNOSIS — M25511 Pain in right shoulder: Secondary | ICD-10-CM

## 2019-12-15 NOTE — Therapy (Signed)
[facility] [address] [place], [name], [address] Phone: [phone]   Fax:  [phone]  Physical Therapy Treatment  Patient Details  Name: [name] MRN: [id number] Date of Birth: 05/26/1961 Referring Provider (PT): Dr. Coletta Memos    Encounter Date: 12/15/2019  PT End of Session - 12/15/19 1325    Visit Number  3    Number of Visits  16    Date for PT Re-Evaluation  01/27/20    Authorization Type  [employer]HC MCR    PT Start Time  1143   late   PT Stop Time  1230    PT Time Calculation (min)  47 min    Activity Tolerance  Patient tolerated treatment well    Behavior During Therapy  [facility] for tasks assessed/performed       Past Medical History:  Diagnosis Date  . Anemia   . Anemia   . Ankle syndesmosis disruption   . Anxiety    takes Ativan and Valium, after mother passed  . Arthritis    bilateral knees s/p knee replacement bilaterally  . Asthma    but pt states not severe enough to even have an inhaler  . Bronchitis   . Bruising    pt states unexplained d/t fibromyalgia  . Chronic back pain    2012 tailbone surgery and 3 lower discs.   . Closed fracture of distal end of right fibula and tibia   . Depression    from Fibromyalgia diagnosis; not taking medicine. since 2001  . Dizziness    rarely  . Fibromyalgia    diagnosed 2001  . GERD (gastroesophageal reflux disease)    Prilosec occasionally  . Headache(784.0)    "sinus headaches"  . History of hiatal hernia   . Hypertension    since 2013  . Hyperthyroidism    subclinical, no treatment; thyroid nodules  . IBS (irritable bowel syndrome)   . Impaired memory    states from fibromyalgia  . Insomnia    takes Ambien  . Jones fracture    left foot fifth metatarsal  . Multiple allergies    including latex, pet dander, shellfish, pet dander  . Painful orthopaedic hardware right ankle 05/27/2016  . Seasonal allergies   . Shortness of breath    with  exertion;pt states that its related to her weight  . Sore gums    this is why pt is on Amoxil-only takes for dental work  . Tachycardia   . Thyroid goiter   . Varicose vein    protrudes above skin-per pt;vein popped and bruised;ultrasound done to make sure that there were no clots;noclots were found    Past Surgical History:  Procedure Laterality Date  . ANTERIOR LUMBAR FUSION  09/20/2011   Procedure: ANTERIOR LUMBAR FUSION 1 LEVEL;  Surgeon: Carmela Hurt;  Location: [facility];  Service: Neurosurgery;  Laterality: N/A;  Lumbar five-Sacral One Anterior Lumbar Interbody Fusion /Dr. Early to Approach   . CERVICAL DISC SURGERY     WITH TITANIUM PLATE IN NECK---LEFT SIDE  . CERVICAL SPINE SURGERY  03/24/2019  . CESAREAN SECTION    . fibroidectomy    . FRACTURE SURGERY  05/08/2010   Jones fracture left foot fifth metatarsal  . HARDWARE REMOVAL Right 05/28/2016   Procedure: RIGHT ANKLE HARDWARE REMOVAL;  Surgeon: Salvatore Marvel, MD;  Location: [facility];  Service: Orthopedics;  Laterality: Right;  . IRRIGATION AND DEBRIDEMENT KNEE  04/09/2012   Procedure:  IRRIGATION AND DEBRIDEMENT KNEE;  Surgeon: Thera Flake., MD;  Location: [facility] OR;  Service: Orthopedics;  Laterality: Right;  . JOINT REPLACEMENT  01-27-2012   left total knee and Right total knee  . KNEE SURGERY  2005   Left knee arthroscopy  . NASAL SEPTOPLASTY W/ TURBINOPLASTY  2007   due to recurrent sinusitis  . ORIF ANKLE FRACTURE Right 06/21/2015   Procedure: OPEN REDUCTION INTERNAL FIXATION RIGHT DISTAL FIBULA  FRACTURE AND OPEN REDUCTION INTERNAL FIXATION SYNDESMOSIS ;  Surgeon: Salvatore Marvel, MD;  Location: [facility];  Service: Orthopedics;  Laterality: Right;  . SHOULDER ARTHROSCOPY W/ ROTATOR CUFF REPAIR Right   . SHOULDER SURGERY Left   . SPINE SURGERY  2004   Cervical plate, ACDF  . STERIOD INJECTION  01/27/2012   Procedure: STEROID INJECTION;  Surgeon: Nilda Simmer, MD;  Location: [facility] OR;   Service: Orthopedics;  Laterality: Right;  . TONSILLECTOMY  2007  . TOTAL KNEE ARTHROPLASTY  01/27/2012   Procedure: TOTAL KNEE ARTHROPLASTY;  Surgeon: Nilda Simmer, MD;  Bilateral  . TOTAL KNEE ARTHROPLASTY  04/06/2012   Procedure: TOTAL KNEE ARTHROPLASTY;  Surgeon: Nilda Simmer, MD;  Location: [facility];  Service: Orthopedics;  Laterality: Right;  . TUBAL LIGATION    . UTERINE FIBROID SURGERY     mid 200s    There were no vitals filed for this visit.  Subjective Assessment - 12/15/19 1145    Subjective  KNees were killing me after last visit .  Running late.  Arm is about 8/10.    Currently in Pain?  Yes    Pain Score  8     Pain Location  Arm    Pain Orientation  Right;Left         [facility] Adult PT Treatment/Exercise - 12/15/19 0001      Shoulder Exercises: Supine   External Rotation  Strengthening;Both;10 reps    Theraband Level (Shoulder External Rotation)  Level 1 (Yellow)    External Rotation Weight (lbs)  2 x 10     Shoulder Flexion Weight (lbs)  cane x 10 chest press, unable to do overhead     Other Supine Exercises  scapular etraction x 10     Other Supine Exercises  cervical rotation x 5       Shoulder Exercises: Seated   Row  Strengthening;Both;20 reps    Theraband Level (Shoulder Row)  Level 3 (Green)      Shoulder Exercises: ROM/Strengthening   [place]  UE flexion , circles and scaption       Shoulder Exercises: Stretch   Other Shoulder Stretches  ulnar nerve flossing, challenging due to Rt shoulder pain and weakness     Other Shoulder Stretches  ulnar nerve glides arm supported, done in sitting and supine       Moist Heat Therapy   Number Minutes Moist Heat  15 Minutes    Moist Heat Location  Cervical      Electrical Stimulation   Electrical Stimulation Location  upper back     Electrical Stimulation Action  IFC     Electrical Stimulation Parameters  14    Electrical Stimulation Goals  Pain             PT Education - 12/15/19 1325    Education  Details  AAROM, ulnar nerve glides    Person(s) Educated  Patient    Methods  Explanation;Demonstration;Handout    Comprehension  Verbalized understanding;Returned demonstration  PT Short Term Goals - 12/15/19 1326      PT SHORT TERM GOAL #1   Title  Pt will be I with initial HEP for reduction of symptoms    Status  On-going      PT SHORT TERM GOAL #2   Title  Pt will report 25% less symptoms in Rt UE to allow improved grip, fine motor skills    Status  On-going      PT SHORT TERM GOAL #3   Title  Pt will be able to report min pain with AROM of cervical spine    Status  On-going      PT SHORT TERM GOAL #4   Title  Pt will complete balance screen and set goal    Status  Unable to assess        PT Long Term Goals - 12/02/19 2011      PT LONG TERM GOAL #1   Title  Pt will be able to open jars and containers with min occasional difficulty    Time  8    Period  Weeks    Status  New    Target Date  01/27/20      PT LONG TERM GOAL #2   Title  Pt will be able to get dressed, complete ADLs more efficiently, no rest breaks    Time  8    Period  Weeks    Status  New    Target Date  01/27/20      PT LONG TERM GOAL #3   Title  Pt will be able to reach overhead with bilateral UEs to 120 deg or more    Baseline  90 deg    Time  8    Period  Weeks    Status  New    Target Date  01/27/20      PT LONG TERM GOAL #4   Title  pt will increase UE strength to 4 /5 in bilateral shoulder flexion/abduction    Time  8    Period  Weeks    Status  New    Target Date  01/27/20      PT LONG TERM GOAL #5   Title  balance score TBA if warranted.    Time  8    Period  Weeks    Status  New    Target Date  01/27/20      Additional Long Term Goals   Additional Long Term Goals  Yes      PT LONG TERM GOAL #6   Title  Grip strength will improve by 10 lbs in each UE    Time  8    Period  Weeks    Status  New    Target Date  01/27/20            Plan - 12/15/19 1327     Clinical Impression Statement  Pt late today.  Able to do modified ulner nerve glides , needed Ue support. Re-applied IFC for pain control.  Rowing reduced pain.    Examination-Activity Limitations  Bathing;Hygiene/Grooming;Squat;Bed Mobility;Lift;Stairs;Stand;Bend;Locomotion Level;Transfers;Sit;Dressing;Sleep    PT Treatment/Interventions  ADLs/Self Care Home Management;Electrical Stimulation;Therapeutic activities;Patient/family education;Taping;Therapeutic exercise;Cryotherapy;Functional mobility training;Neuromuscular re-education;Manual techniques;Passive range of motion;Balance training;Moist Heat    PT Next Visit Plan  Add row , Need to perform ulnar nerve glides in gravity eliminated position., scapular mobility, balance screen and IFC if favorable    PT Home Exercise Plan  putty, chin tucks, scapular retraction, supine  cane chest press and overhead, yellow band ER/IR    Consulted and Agree with Plan of Care  Patient       Patient will benefit from skilled therapeutic intervention in order to improve the following deficits and impairments:  Decreased activity tolerance, Decreased balance, Difficulty walking, Impaired flexibility, Obesity, Impaired UE functional use, Decreased strength, Decreased range of motion, Decreased endurance, Postural dysfunction, Decreased mobility, Dizziness, Increased fascial restricitons, Impaired sensation, Pain, Hypomobility, Improper body mechanics, Increased edema  Visit Diagnosis: Ulnar neuropathy of both upper extremities  Cervicalgia  Other disturbances of skin sensation  Right shoulder pain, unspecified chronicity  Muscle weakness (generalized)     Problem List Patient Active Problem List   Diagnosis Date Noted  . Spondylolisthesis of lumbar region 10/30/2017  . Painful orthopaedic hardware right ankle 05/27/2016  . Closed fracture of distal end of right fibula and tibia   . Ankle syndesmosis disruption   . ASCUS favor benign 01/06/2014  .  Leg swelling 12/22/2013  . Seasonal allergies   . Anemia   . Chronic back pain due to DJD with history surgery   . Hyperthyroidism   . 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 Status post left total prosthetic replacement of knee joint using cement 03/24/2012  . Fibromyalgia   . GERD (gastroesophageal reflux disease)   . Anxiety   . Depression   . Hypertension   . Lumbar degenerative disc disease 09/25/2011    [name] 12/15/2019, 1:36 PM  [facility] [address] [place], [place], [address] Phone: [phone]   Fax:  [phone]  Name: [name] MRN: [id number] Date of Birth: 20-Dec-1960   Raeford Razor, PT 12/15/19 1:37 PM Phone: [phone] Fax: [phone]

## 2019-12-20 ENCOUNTER — Other Ambulatory Visit: Payer: Self-pay

## 2019-12-20 ENCOUNTER — Ambulatory Visit: Payer: Medicare Other | Admitting: Physical Therapy

## 2019-12-20 ENCOUNTER — Encounter: Payer: Self-pay | Admitting: Physical Therapy

## 2019-12-20 DIAGNOSIS — M25511 Pain in right shoulder: Secondary | ICD-10-CM

## 2019-12-20 DIAGNOSIS — G5623 Lesion of ulnar nerve, bilateral upper limbs: Secondary | ICD-10-CM

## 2019-12-20 DIAGNOSIS — R208 Other disturbances of skin sensation: Secondary | ICD-10-CM

## 2019-12-20 DIAGNOSIS — M542 Cervicalgia: Secondary | ICD-10-CM

## 2019-12-20 DIAGNOSIS — M6281 Muscle weakness (generalized): Secondary | ICD-10-CM

## 2019-12-20 NOTE — Therapy (Signed)
[facility] [address] [place], [name], [address] Phone: [phone]   Fax:  [phone]  Physical Therapy Treatment  Patient Details  Name: [name] MRN: [id number] Date of Birth: 1961-09-11 Referring Provider (PT): Dr. Coletta Memos    Encounter Date: 12/20/2019  PT End of Session - 12/20/19 1055    Visit Number  4    Number of Visits  16    Date for PT Re-Evaluation  01/27/20    Authorization Type  UHC MCR    PT Start Time  1047    PT Stop Time  1120    PT Time Calculation (min)  33 min    Activity Tolerance  Patient tolerated treatment well    Behavior During Therapy  [facility] for tasks assessed/performed       Past Medical History:  Diagnosis Date  . Anemia   . Anemia   . Ankle syndesmosis disruption   . Anxiety    takes Ativan and Valium, after mother passed  . Arthritis    bilateral knees s/p knee replacement bilaterally  . Asthma    but pt states not severe enough to even have an inhaler  . Bronchitis   . Bruising    pt states unexplained d/t fibromyalgia  . Chronic back pain    2012 tailbone surgery and 3 lower discs.   . Closed fracture of distal end of right fibula and tibia   . Depression    from Fibromyalgia diagnosis; not taking medicine. since 2001  . Dizziness    rarely  . Fibromyalgia    diagnosed 2001  . GERD (gastroesophageal reflux disease)    Prilosec occasionally  . Headache(784.0)    "sinus headaches"  . History of hiatal hernia   . Hypertension    since 2013  . Hyperthyroidism    subclinical, no treatment; thyroid nodules  . IBS (irritable bowel syndrome)   . Impaired memory    states from fibromyalgia  . Insomnia    takes Ambien  . Jones fracture    left foot fifth metatarsal  . Multiple allergies    including latex, pet dander, shellfish, pet dander  . Painful orthopaedic hardware right ankle 05/27/2016  . Seasonal allergies   . Shortness of breath    with  exertion;pt states that its related to her weight  . Sore gums    this is why pt is on Amoxil-only takes for dental work  . Tachycardia   . Thyroid goiter   . Varicose vein    protrudes above skin-per pt;vein popped and bruised;ultrasound done to make sure that there were no clots;noclots were found    Past Surgical History:  Procedure Laterality Date  . ANTERIOR LUMBAR FUSION  09/20/2011   Procedure: ANTERIOR LUMBAR FUSION 1 LEVEL;  Surgeon: Carmela Hurt;  Location: [facility];  Service: Neurosurgery;  Laterality: N/A;  Lumbar five-Sacral One Anterior Lumbar Interbody Fusion /Dr. Early to Approach   . CERVICAL DISC SURGERY     WITH TITANIUM PLATE IN NECK---LEFT SIDE  . CERVICAL SPINE SURGERY  03/24/2019  . CESAREAN SECTION    . fibroidectomy    . FRACTURE SURGERY  05/08/2010   Jones fracture left foot fifth metatarsal  . HARDWARE REMOVAL Right 05/28/2016   Procedure: RIGHT ANKLE HARDWARE REMOVAL;  Surgeon: Salvatore Marvel, MD;  Location: [facility];  Service: Orthopedics;  Laterality: Right;  . IRRIGATION AND DEBRIDEMENT KNEE  04/09/2012   Procedure: IRRIGATION AND  DEBRIDEMENT KNEE;  Surgeon: Thera Flake., MD;  Location: [facility] OR;  Service: Orthopedics;  Laterality: Right;  . JOINT REPLACEMENT  01-27-2012   left total knee and Right total knee  . KNEE SURGERY  2005   Left knee arthroscopy  . NASAL SEPTOPLASTY W/ TURBINOPLASTY  2007   due to recurrent sinusitis  . ORIF ANKLE FRACTURE Right 06/21/2015   Procedure: OPEN REDUCTION INTERNAL FIXATION RIGHT DISTAL FIBULA  FRACTURE AND OPEN REDUCTION INTERNAL FIXATION SYNDESMOSIS ;  Surgeon: Salvatore Marvel, MD;  Location: [facility];  Service: Orthopedics;  Laterality: Right;  . SHOULDER ARTHROSCOPY W/ ROTATOR CUFF REPAIR Right   . SHOULDER SURGERY Left   . SPINE SURGERY  2004   Cervical plate, ACDF  . STERIOD INJECTION  01/27/2012   Procedure: STEROID INJECTION;  Surgeon: Nilda Simmer, MD;  Location: [facility] OR;   Service: Orthopedics;  Laterality: Right;  . TONSILLECTOMY  2007  . TOTAL KNEE ARTHROPLASTY  01/27/2012   Procedure: TOTAL KNEE ARTHROPLASTY;  Surgeon: Nilda Simmer, MD;  Bilateral  . TOTAL KNEE ARTHROPLASTY  04/06/2012   Procedure: TOTAL KNEE ARTHROPLASTY;  Surgeon: Nilda Simmer, MD;  Location: [facility];  Service: Orthopedics;  Laterality: Right;  . TUBAL LIGATION    . UTERINE FIBROID SURGERY     mid 200s    There were no vitals filed for this visit.  Subjective Assessment - 12/20/19 1054    Subjective  My whole body hurts today and also yesterday, may be the weather change or my fibro? Unable to rate due to widespread pain. Appears premedicated    Currently in Pain?  --   appears severe and widespread          [facility] Adult PT Treatment/Exercise - 12/20/19 0001      Elbow Exercises   Elbow Flexion  Strengthening;Right;10 reps   2 lb     Shoulder Exercises: Supine   External Rotation  Strengthening;Both;10 reps    Theraband Level (Shoulder External Rotation)  Level 1 (Yellow)    External Rotation Weight (lbs)  2 x 10       Shoulder Exercises: Seated   Row  Strengthening;Both;20 reps    Theraband Level (Shoulder Row)  Level 1 (Yellow)    Horizontal ABduction  Strengthening;Both;10 reps;Theraband    Theraband Level (Shoulder Horizontal ABduction)  Level 1 (Yellow)    External Rotation  Strengthening;Both;20 reps;Theraband    Theraband Level (Shoulder External Rotation)  Level 1 (Yellow)      Hand Exercises   Other Hand Exercises  velcro key grip x 4       Wrist Exercises   Wrist Flexion  Strengthening;Right;20 reps    Bar Weights/Barbell (Wrist Flexion)  1 lb    Wrist Extension  Strengthening;Right;20 reps    Bar Weights/Barbell (Wrist Extension)  1 lb      Neck Exercises: Stretches   Upper Trapezius Stretch  Right;Left;2 reps;30 seconds    Upper Trapezius Stretch Limitations  arms supported on table     Levator Stretch  Right;Left;2 reps;30 seconds    Levator  Stretch Limitations  arms on table palms up     Lower Cervical/Upper Thoracic Stretch  3 reps;30 seconds             PT Education - 12/20/19 1143    Education Details  chronic pain, effect of exercise on pain, attendance, cancellation policy.    Person(s) Educated  Patient    Methods  Explanation  Comprehension  Verbalized understanding       PT Short Term Goals - 12/20/19 1111      PT SHORT TERM GOAL #1   Title  Pt will be I with initial HEP for reduction of symptoms    Status  On-going      PT SHORT TERM GOAL #2   Title  Pt will report 25% less symptoms in Rt UE to allow improved grip, fine motor skills    Status  On-going      PT SHORT TERM GOAL #3   Title  Pt will be able to report min pain with AROM of cervical spine    Status  On-going      PT SHORT TERM GOAL #4   Title  Pt will complete balance screen and set goal    Status  Unable to assess        PT Long Term Goals - 12/02/19 2011      PT LONG TERM GOAL #1   Title  Pt will be able to open jars and containers with min occasional difficulty    Time  8    Period  Weeks    Status  New    Target Date  01/27/20      PT LONG TERM GOAL #2   Title  Pt will be able to get dressed, complete ADLs more efficiently, no rest breaks    Time  8    Period  Weeks    Status  New    Target Date  01/27/20      PT LONG TERM GOAL #3   Title  Pt will be able to reach overhead with bilateral UEs to 120 deg or more    Baseline  90 deg    Time  8    Period  Weeks    Status  New    Target Date  01/27/20      PT LONG TERM GOAL #4   Title  pt will increase UE strength to 4 /5 in bilateral shoulder flexion/abduction    Time  8    Period  Weeks    Status  New    Target Date  01/27/20      PT LONG TERM GOAL #5   Title  balance score TBA if warranted.    Time  8    Period  Weeks    Status  New    Target Date  01/27/20      Additional Long Term Goals   Additional Long Term Goals  Yes      PT LONG TERM GOAL #6    Title  Grip strength will improve by 10 lbs in each UE    Time  8    Period  Weeks    Status  New    Target Date  01/27/20            Plan - 12/20/19 1112    Clinical Impression Statement  Patient with increased pain today. Did not tolerate much exercise today and balance screen would be unsafe today. Having pain in her back, legs, feet, neck , shoulder and arms.  I applaud her committment to come to PT given her pain but I reiterated that she can call us to cancel if pain is too severe.  She does not tolerate manual and IFC seems to make her pain worse. SO far she reports no change in symptoms but does say that when she leaves PT her pain  is less. Relief does not last and has some rebound pain.    Examination-Activity Limitations  Bathing;Hygiene/Grooming;Squat;Bed Mobility;Lift;Stairs;Stand;Bend;Locomotion Level;Transfers;Sit;Dressing;Sleep    PT Treatment/Interventions  ADLs/Self Care Home Management;Electrical Stimulation;Therapeutic activities;Patient/family education;Taping;Therapeutic exercise;Cryotherapy;Functional mobility training;Neuromuscular re-education;Manual techniques;Passive range of motion;Balance training;Moist Heat    PT Next Visit Plan  Add row , Need to perform ulnar nerve glides in gravity eliminated position., scapular mobility, balance screen and IFC if favorable    PT Home Exercise Plan  putty, chin tucks, scapular retraction, supine cane chest press and overhead, yellow band ER/IR, nerve glides (ulnar as able)    Consulted and Agree with Plan of Care  Patient       Patient will benefit from skilled therapeutic intervention in order to improve the following deficits and impairments:  Decreased activity tolerance, Decreased balance, Difficulty walking, Impaired flexibility, Obesity, Impaired UE functional use, Decreased strength, Decreased range of motion, Decreased endurance, Postural dysfunction, Decreased mobility, Dizziness, Increased fascial restricitons,  Impaired sensation, Pain, Hypomobility, Improper body mechanics, Increased edema  Visit Diagnosis: Ulnar neuropathy of both upper extremities  Cervicalgia  Other disturbances of skin sensation  Right shoulder pain, unspecified chronicity  Muscle weakness (generalized)     Problem List Patient Active Problem List   Diagnosis Date Noted  . Spondylolisthesis of lumbar region 10/30/2017  . Painful orthopaedic hardware right ankle 05/27/2016  . Closed fracture of distal end of right fibula and tibia   . Ankle syndesmosis disruption   . ASCUS favor benign 01/06/2014  . Leg swelling 12/22/2013  . Seasonal allergies   . Anemia   . Chronic back pain due to DJD with history surgery   . Hyperthyroidism   . 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 Status post left total prosthetic replacement of knee joint using cement 03/24/2012  . Fibromyalgia   . GERD (gastroesophageal reflux disease)   . Anxiety   . Depression   . Hypertension   . Lumbar degenerative disc disease 09/25/2011    Saajan Willmon 12/20/2019, 11:49 AM  [facility] [address] [place], [name], [address] Phone: [phone]   Fax:  [phone]  Name: [name] MRN: [id number] Date of Birth: [birth year]  Karie Mainland, PT 12/20/19 11:50 AM Phone: [phone] Fax: [phone]

## 2019-12-22 ENCOUNTER — Ambulatory Visit: Payer: Medicare Other | Admitting: Physical Therapy

## 2019-12-27 ENCOUNTER — Ambulatory Visit: Payer: Medicare Other | Admitting: Physical Therapy

## 2019-12-29 ENCOUNTER — Other Ambulatory Visit: Payer: Self-pay

## 2019-12-29 ENCOUNTER — Encounter: Payer: Self-pay | Admitting: Physical Therapy

## 2019-12-29 ENCOUNTER — Ambulatory Visit: Payer: Medicare Other | Admitting: Physical Therapy

## 2019-12-29 DIAGNOSIS — M6281 Muscle weakness (generalized): Secondary | ICD-10-CM

## 2019-12-29 DIAGNOSIS — R208 Other disturbances of skin sensation: Secondary | ICD-10-CM

## 2019-12-29 DIAGNOSIS — G5623 Lesion of ulnar nerve, bilateral upper limbs: Secondary | ICD-10-CM

## 2019-12-29 DIAGNOSIS — M25511 Pain in right shoulder: Secondary | ICD-10-CM

## 2019-12-29 DIAGNOSIS — M542 Cervicalgia: Secondary | ICD-10-CM

## 2019-12-29 NOTE — Patient Instructions (Signed)
  Step 1  Step 2  Standing Shoulder Row with Anchored Resistance sets: 2  reps: 10  hold: 5  daily: 2  weekly: 7 Setup  Begin standing upright, holding both ends of a resistance band that is anchored in front of you at chest height, with your palms facing inward. Movement  Pull your arms back with your elbows tucked at your sides, then return to the starting position and repeat. Tip  Make sure to keep your core engaged and focus on squeezing your shoulder blades together as you pull on the band. Step 1  Step 2  Single Arm Shoulder Extension with Anchored Resistance sets: 2  reps: 10  hold: 5  daily: 2  weekly: 7 Setup  Begin in a standing position holding one end of a resistance band with your arm straight in front of your body. You should be facing the anchor point. Movement  Pull your arm down to your side against the resistance band, then return to start and repeat. Tip  Make sure to keep your elbow straight and maintain good posture during the exercise.

## 2019-12-29 NOTE — Therapy (Addendum)
[place], [place], [address] Phone: [phone]   Fax:  [phone]  Physical Therapy Treatment/Discharge   Patient Details  Name: [name] MRN: [id number] Date of Birth: 1961-05-27 Referring Provider (PT): Dr. Ashok Pall    Encounter Date: 12/29/2019  PT End of Session - 12/29/19 1008    Visit Number  5    Number of Visits  16    Date for PT Re-Evaluation  01/27/20    Authorization Type  UHC MCR    PT Start Time  1005    PT Stop Time  1047    PT Time Calculation (min)  42 min    Activity Tolerance  Patient tolerated treatment well    Behavior During Therapy  [facility] for tasks assessed/performed       Past Medical History:  Diagnosis Date  . Anemia   . Anemia   . Ankle syndesmosis disruption   . Anxiety    takes Ativan and Valium, after mother passed  . Arthritis    bilateral knees s/p knee replacement bilaterally  . Asthma    but pt states not severe enough to even have an inhaler  . Bronchitis   . Bruising    pt states unexplained d/t fibromyalgia  . Chronic back pain    2012 tailbone surgery and 3 lower discs.   . Closed fracture of distal end of right fibula and tibia   . Depression    from Fibromyalgia diagnosis; not taking medicine. since 2001  . Dizziness    rarely  . Fibromyalgia    diagnosed 2001  . GERD (gastroesophageal reflux disease)    Prilosec occasionally  . Headache(784.0)    "sinus headaches"  . History of hiatal hernia   . Hypertension    since 2013  . Hyperthyroidism    subclinical, no treatment; thyroid nodules  . IBS (irritable bowel syndrome)   . Impaired memory    states from fibromyalgia  . Insomnia    takes Ambien  . Jones fracture    left foot fifth metatarsal  . Multiple allergies    including latex, pet dander, shellfish, pet dander  . Painful orthopaedic hardware right ankle 05/27/2016  . Seasonal allergies   . Shortness of breath    with  exertion;pt states that its related to her weight  . Sore gums    this is why pt is on Amoxil-only takes for dental work  . Tachycardia   . Thyroid goiter   . Varicose vein    protrudes above skin-per pt;vein popped and bruised;ultrasound done to make sure that there were no clots;noclots were found    Past Surgical History:  Procedure Laterality Date  . ANTERIOR LUMBAR FUSION  09/20/2011   Procedure: ANTERIOR LUMBAR FUSION 1 LEVEL;  Surgeon: Winfield Cunas;  Location: [place] [facility];  Service: Neurosurgery;  Laterality: N/A;  Lumbar five-Sacral One Anterior Lumbar Interbody Fusion /Dr. Early to Approach   . CERVICAL DISC SURGERY     WITH TITANIUM PLATE IN NECK---LEFT SIDE  . CERVICAL SPINE SURGERY  03/24/2019  . CESAREAN SECTION    . fibroidectomy    . FRACTURE SURGERY  05/08/2010   Jones fracture left foot fifth metatarsal  . HARDWARE REMOVAL Right 05/28/2016   Procedure: RIGHT ANKLE HARDWARE REMOVAL;  Surgeon: Elsie Saas, MD;  Location: [place];  Service: Orthopedics;  Laterality: Right;  . IRRIGATION AND DEBRIDEMENT KNEE  04/09/2012   Procedure: IRRIGATION  AND DEBRIDEMENT KNEE;  Surgeon: Yvette Rack., MD;  Location: [place];  Service: Orthopedics;  Laterality: Right;  . JOINT REPLACEMENT  01-27-2012   left total knee and Right total knee  . KNEE SURGERY  2005   Left knee arthroscopy  . NASAL SEPTOPLASTY W/ TURBINOPLASTY  2007   due to recurrent sinusitis  . ORIF ANKLE FRACTURE Right 06/21/2015   Procedure: OPEN REDUCTION INTERNAL FIXATION RIGHT DISTAL FIBULA  FRACTURE AND OPEN REDUCTION INTERNAL FIXATION SYNDESMOSIS ;  Surgeon: Elsie Saas, MD;  Location: [place];  Service: Orthopedics;  Laterality: Right;  . SHOULDER ARTHROSCOPY W/ ROTATOR CUFF REPAIR Right   . SHOULDER SURGERY Left   . SPINE SURGERY  2004   Cervical plate, ACDF  . STERIOD INJECTION  01/27/2012   Procedure: STEROID INJECTION;  Surgeon: Lorn Junes, MD;  Location: [place];   Service: Orthopedics;  Laterality: Right;  . TONSILLECTOMY  2007  . TOTAL KNEE ARTHROPLASTY  01/27/2012   Procedure: TOTAL KNEE ARTHROPLASTY;  Surgeon: Lorn Junes, MD;  Bilateral  . TOTAL KNEE ARTHROPLASTY  04/06/2012   Procedure: TOTAL KNEE ARTHROPLASTY;  Surgeon: Lorn Junes, MD;  Location: [place];  Service: Orthopedics;  Laterality: Right;  . TUBAL LIGATION    . UTERINE FIBROID SURGERY     mid 200s    There were no vitals filed for this visit.  Subjective Assessment - 12/29/19 1007    Subjective  I am OK considering how I felt the last time.  Missed due to spasms in her head, top of her head.  Cant lift her arm.    Currently in Pain?  Yes    Pain Score  8     Pain Location  Arm    Pain Orientation  Right;Left    Pain Descriptors / Indicators  Aching    Pain Type  Chronic pain    Pain Onset  More than a month ago    Pain Frequency  Constant       [facility] Adult PT Treatment/Exercise - 12/29/19 0001      Shoulder Exercises: Supine   Protraction  AROM;Both;10 reps    External Rotation  Strengthening;Both;10 reps    Theraband Level (Shoulder External Rotation)  Level 2 (Red)    External Rotation Weight (lbs)  2 x 10     Flexion  AROM;Both;20 reps    Shoulder Flexion Weight (lbs)  cane with and without     Other Supine Exercises  shoulder extension red band with PT holding x 10 , add core       Shoulder Exercises: Standing   Extension  Strengthening;Both;15 reps    Theraband Level (Shoulder Extension)  Level 2 (Red)    Row  Strengthening;Both;15 reps    Theraband Level (Shoulder Row)  Level 2 (Red)      Shoulder Exercises: ROM/Strengthening   Nustep  6 min LE and UE level 1             PT Education - 12/29/19 1703    Education Details  HEP, AROM no weight needed if in supine , symptoms mgmt    Person(s) Educated  Patient    Methods  Explanation;Demonstration    Comprehension  Verbalized understanding;Returned demonstration;Need further instruction       PT  Short Term Goals - 12/20/19 1111      PT SHORT TERM GOAL #1   Title  Pt will be I with initial HEP for reduction of  symptoms    Status  On-going      PT SHORT TERM GOAL #2   Title  Pt will report 25% less symptoms in Rt UE to allow improved grip, fine motor skills    Status  On-going      PT SHORT TERM GOAL #3   Title  Pt will be able to report min pain with AROM of cervical spine    Status  On-going      PT SHORT TERM GOAL #4   Title  Pt will complete balance screen and set goal    Status  Unable to assess        PT Long Term Goals - 12/02/19 2011      PT LONG TERM GOAL #1   Title  Pt will be able to open jars and containers with min occasional difficulty    Time  8    Period  Weeks    Status  New    Target Date  01/27/20      PT LONG TERM GOAL #2   Title  Pt will be able to get dressed, complete ADLs more efficiently, no rest breaks    Time  8    Period  Weeks    Status  New    Target Date  01/27/20      PT LONG TERM GOAL #3   Title  Pt will be able to reach overhead with bilateral UEs to 120 deg or more    Baseline  90 deg    Time  8    Period  Weeks    Status  New    Target Date  01/27/20      PT LONG TERM GOAL #4   Title  pt will increase UE strength to 4 /5 in bilateral shoulder flexion/abduction    Time  8    Period  Weeks    Status  New    Target Date  01/27/20      PT LONG TERM GOAL #5   Title  balance score TBA if warranted.    Time  8    Period  Weeks    Status  New    Target Date  01/27/20      Additional Long Term Goals   Additional Long Term Goals  Yes      PT LONG TERM GOAL #6   Title  Grip strength will improve by 10 lbs in each UE    Time  8    Period  Weeks    Status  New    Target Date  01/27/20            Plan - 12/29/19 1704    Clinical Impression Statement  Patient reports feeling better than last visit, but still rates as 8/10.  She cont to endorse relief post PT.  Did well on the Nustep and and performed full HEP  with cues.  Encouraged her to do her exercises at home and she may feel better, does not tolerate modalities and so that is what we have to work with.    PT Treatment/Interventions  ADLs/Self Care Home Management;Electrical Stimulation;Therapeutic activities;Patient/family education;Taping;Therapeutic exercise;Cryotherapy;Functional mobility training;Neuromuscular re-education;Manual techniques;Passive range of motion;Balance training;Moist Heat    PT Next Visit Plan  Check goals, FOTO? Add row , Need to perform ulnar nerve glides in gravity eliminated position., scapular mobility, balance screen and IFC if favorable    PT Home Exercise Plan  putty, chin tucks, scapular retraction, supine  cane chest press and overhead, yellow band ER/IR, nerve glides (ulnar as able), row, ext and LTR with head turns    Consulted and Agree with Plan of Care  Patient       Patient will benefit from skilled therapeutic intervention in order to improve the following deficits and impairments:  Decreased activity tolerance, Decreased balance, Difficulty walking, Impaired flexibility, Obesity, Impaired UE functional use, Decreased strength, Decreased range of motion, Decreased endurance, Postural dysfunction, Decreased mobility, Dizziness, Increased fascial restricitons, Impaired sensation, Pain, Hypomobility, Improper body mechanics, Increased edema  Visit Diagnosis: Ulnar neuropathy of both upper extremities  Cervicalgia  Other disturbances of skin sensation  Muscle weakness (generalized)  Right shoulder pain, unspecified chronicity     Problem List Patient Active Problem List   Diagnosis Date Noted  . Spondylolisthesis of lumbar region 10/30/2017  . Painful orthopaedic hardware right ankle 05/27/2016  . Closed fracture of distal end of right fibula and tibia   . Ankle syndesmosis disruption   . ASCUS favor benign 01/06/2014  . Leg swelling 12/22/2013  . Seasonal allergies   . Anemia   . Chronic back  pain due to DJD with history surgery   . Hyperthyroidism   . 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 Status post left total prosthetic replacement of knee joint using cement 03/24/2012  . Fibromyalgia   . GERD (gastroesophageal reflux disease)   . Anxiety   . Depression   . Hypertension   . Lumbar degenerative disc disease 09/25/2011    Maximilliano Kersh 12/29/2019, 5:10 PM  [facility] [address] [place], [place], [address] Phone: [phone]   Fax:  [phone]  Name: [name] MRN: [id number] Date of Birth: [birth year]  Raeford Razor, PT 12/29/19 5:10 PM Phone: [phone] Fax: [phone]   PHYSICAL THERAPY DISCHARGE SUMMARY  Visits from Start of Care: 5  Current functional level related to goals / functional outcomes: See above for most recent info   Remaining deficits: Unknown    Education / Equipment: HEP, posture, nerve glides  Plan: Patient agrees to discharge.  Patient goals were not met. Patient is being discharged due to a change in medical status.  ?????    Was following up with patient and noticed she underwent spinal surgery last week.  Will DC  Raeford Razor, PT 02/21/20 10:50 AM Phone: [phone] Fax: [phone]

## 2020-01-03 ENCOUNTER — Ambulatory Visit: Payer: Medicare Other | Admitting: Physical Therapy

## 2020-01-05 ENCOUNTER — Ambulatory Visit: Payer: Medicare Other | Admitting: Physical Therapy

## 2020-01-10 ENCOUNTER — Encounter: Payer: Medicare Other | Admitting: Physical Therapy

## 2020-01-12 ENCOUNTER — Encounter: Payer: Medicare Other | Admitting: Physical Therapy

## 2020-01-19 ENCOUNTER — Ambulatory Visit: Payer: Medicare Other | Admitting: Physical Therapy

## 2020-01-24 ENCOUNTER — Encounter: Payer: Medicare Other | Admitting: Physical Therapy

## 2020-01-26 ENCOUNTER — Ambulatory Visit: Payer: Medicare Other | Admitting: Physical Therapy

## 2020-01-31 ENCOUNTER — Encounter: Payer: Medicare Other | Admitting: Physical Therapy

## 2020-01-31 ENCOUNTER — Encounter: Payer: Self-pay | Admitting: Physical Therapy

## 2020-01-31 ENCOUNTER — Telehealth: Payer: Self-pay | Admitting: Physical Therapy

## 2020-01-31 NOTE — Telephone Encounter (Signed)
Patient has cancelled many appt due to what she reports as an increase in pain on L side and bilateral legs, difficulty with walking and worsening balance.  She reports onset 4 days post COVID vaccine.  I recommend she follow up with Dr. Franky Macho and her primary and she does have an appt tomorrow.  She will reach out to [place] if she feels able to return and /or more diagnostics are done.   Karie Mainland, PT 01/31/20 8:50 AM Phone: [phone] Fax: [phone]

## 2020-02-02 ENCOUNTER — Encounter: Payer: Medicare Other | Admitting: Physical Therapy

## 2020-02-14 ENCOUNTER — Emergency Department (HOSPITAL_COMMUNITY): Payer: Medicare Other

## 2020-02-14 ENCOUNTER — Inpatient Hospital Stay (HOSPITAL_COMMUNITY)
Admission: EM | Admit: 2020-02-14 | Discharge: 2020-02-22 | DRG: 455 | Disposition: A | Discharge status: Home Health | Payer: Medicare Other | Attending: Neurosurgery | Admitting: Neurosurgery

## 2020-02-14 ENCOUNTER — Encounter (HOSPITAL_COMMUNITY): Payer: Self-pay | Admitting: Emergency Medicine

## 2020-02-14 DIAGNOSIS — Z91041 Radiographic dye allergy status: Secondary | ICD-10-CM

## 2020-02-14 DIAGNOSIS — S80211A Abrasion, right knee, initial encounter: Secondary | ICD-10-CM | POA: Diagnosis present

## 2020-02-14 DIAGNOSIS — Z20822 Contact with and (suspected) exposure to covid-19: Secondary | ICD-10-CM | POA: Diagnosis present

## 2020-02-14 DIAGNOSIS — Z91040 Latex allergy status: Secondary | ICD-10-CM

## 2020-02-14 DIAGNOSIS — Z96653 Presence of artificial knee joint, bilateral: Secondary | ICD-10-CM | POA: Diagnosis present

## 2020-02-14 DIAGNOSIS — M6281 Muscle weakness (generalized): Secondary | ICD-10-CM | POA: Diagnosis present

## 2020-02-14 DIAGNOSIS — M5442 Lumbago with sciatica, left side: Secondary | ICD-10-CM | POA: Diagnosis present

## 2020-02-14 DIAGNOSIS — Z419 Encounter for procedure for purposes other than remedying health state, unspecified: Secondary | ICD-10-CM

## 2020-02-14 DIAGNOSIS — Z79899 Other long term (current) drug therapy: Secondary | ICD-10-CM

## 2020-02-14 DIAGNOSIS — F329 Major depressive disorder, single episode, unspecified: Secondary | ICD-10-CM | POA: Diagnosis present

## 2020-02-14 DIAGNOSIS — M5126 Other intervertebral disc displacement, lumbar region: Principal | ICD-10-CM | POA: Diagnosis present

## 2020-02-14 DIAGNOSIS — Z818 Family history of other mental and behavioral disorders: Secondary | ICD-10-CM

## 2020-02-14 DIAGNOSIS — K589 Irritable bowel syndrome without diarrhea: Secondary | ICD-10-CM | POA: Diagnosis present

## 2020-02-14 DIAGNOSIS — F419 Anxiety disorder, unspecified: Secondary | ICD-10-CM | POA: Diagnosis present

## 2020-02-14 DIAGNOSIS — Z823 Family history of stroke: Secondary | ICD-10-CM

## 2020-02-14 DIAGNOSIS — Z7982 Long term (current) use of aspirin: Secondary | ICD-10-CM

## 2020-02-14 DIAGNOSIS — M5124 Other intervertebral disc displacement, thoracic region: Secondary | ICD-10-CM | POA: Diagnosis present

## 2020-02-14 DIAGNOSIS — D649 Anemia, unspecified: Secondary | ICD-10-CM | POA: Diagnosis present

## 2020-02-14 DIAGNOSIS — M47814 Spondylosis without myelopathy or radiculopathy, thoracic region: Secondary | ICD-10-CM | POA: Diagnosis present

## 2020-02-14 DIAGNOSIS — R296 Repeated falls: Secondary | ICD-10-CM | POA: Diagnosis present

## 2020-02-14 DIAGNOSIS — I10 Essential (primary) hypertension: Secondary | ICD-10-CM | POA: Diagnosis present

## 2020-02-14 DIAGNOSIS — E876 Hypokalemia: Secondary | ICD-10-CM

## 2020-02-14 DIAGNOSIS — K219 Gastro-esophageal reflux disease without esophagitis: Secondary | ICD-10-CM | POA: Diagnosis present

## 2020-02-14 DIAGNOSIS — Z8261 Family history of arthritis: Secondary | ICD-10-CM

## 2020-02-14 DIAGNOSIS — M797 Fibromyalgia: Secondary | ICD-10-CM | POA: Diagnosis present

## 2020-02-14 DIAGNOSIS — Z87891 Personal history of nicotine dependence: Secondary | ICD-10-CM

## 2020-02-14 DIAGNOSIS — Z888 Allergy status to other drugs, medicaments and biological substances status: Secondary | ICD-10-CM

## 2020-02-14 DIAGNOSIS — Z7401 Bed confinement status: Secondary | ICD-10-CM

## 2020-02-14 DIAGNOSIS — Z9109 Other allergy status, other than to drugs and biological substances: Secondary | ICD-10-CM

## 2020-02-14 DIAGNOSIS — Z981 Arthrodesis status: Secondary | ICD-10-CM

## 2020-02-14 DIAGNOSIS — Z811 Family history of alcohol abuse and dependence: Secondary | ICD-10-CM

## 2020-02-14 DIAGNOSIS — G629 Polyneuropathy, unspecified: Secondary | ICD-10-CM | POA: Diagnosis present

## 2020-02-14 DIAGNOSIS — M5441 Lumbago with sciatica, right side: Secondary | ICD-10-CM | POA: Diagnosis present

## 2020-02-14 DIAGNOSIS — Z8249 Family history of ischemic heart disease and other diseases of the circulatory system: Secondary | ICD-10-CM

## 2020-02-14 DIAGNOSIS — G8929 Other chronic pain: Secondary | ICD-10-CM | POA: Diagnosis present

## 2020-02-14 DIAGNOSIS — Z833 Family history of diabetes mellitus: Secondary | ICD-10-CM

## 2020-02-14 DIAGNOSIS — Z91013 Allergy to seafood: Secondary | ICD-10-CM

## 2020-02-14 DIAGNOSIS — M479 Spondylosis, unspecified: Secondary | ICD-10-CM | POA: Diagnosis present

## 2020-02-14 DIAGNOSIS — M542 Cervicalgia: Secondary | ICD-10-CM | POA: Diagnosis present

## 2020-02-14 DIAGNOSIS — W19XXXA Unspecified fall, initial encounter: Secondary | ICD-10-CM | POA: Diagnosis present

## 2020-02-14 DIAGNOSIS — E0500 Thyrotoxicosis with diffuse goiter without thyrotoxic crisis or storm: Secondary | ICD-10-CM | POA: Diagnosis present

## 2020-02-14 LAB — BASIC METABOLIC PANEL
Anion gap: 17 — ABNORMAL HIGH (ref 5–15)
BUN: 13 mg/dL (ref 6–20)
CO2: 22 mmol/L (ref 22–32)
Calcium: 9.8 mg/dL (ref 8.9–10.3)
Chloride: 100 mmol/L (ref 98–111)
Creatinine, Ser: 0.93 mg/dL (ref 0.44–1.00)
GFR calc Af Amer: >60 mL/min (ref >60)
GFR calc non Af Amer: >60 mL/min (ref >60)
Glucose, Bld: 83 mg/dL (ref 70–99)
Potassium: 2.4 mmol/L — CL (ref 3.5–5.1)
Sodium: 139 mmol/L (ref 135–145)

## 2020-02-14 LAB — CBC WITH DIFFERENTIAL/PLATELET
Abs Immature Granulocytes: 0.03 10*3/uL (ref 0.00–0.07)
Basophils Absolute: 0.1 10*3/uL (ref 0.0–0.1)
Basophils Relative: 1 %
Eosinophils Absolute: 0.4 10*3/uL (ref 0.0–0.5)
Eosinophils Relative: 3 %
HCT: 41.7 % (ref 36.0–46.0)
Hemoglobin: 12.4 g/dL (ref 12.0–15.0)
Immature Granulocytes: 0 %
Lymphocytes Relative: 36 %
Lymphs Abs: 4.1 10*3/uL — ABNORMAL HIGH (ref 0.7–4.0)
MCH: 22.9 pg — ABNORMAL LOW (ref 26.0–34.0)
MCHC: 29.7 g/dL — ABNORMAL LOW (ref 30.0–36.0)
MCV: 77.1 fL — ABNORMAL LOW (ref 80.0–100.0)
Monocytes Absolute: 1.1 10*3/uL — ABNORMAL HIGH (ref 0.1–1.0)
Monocytes Relative: 9 %
Neutro Abs: 5.8 10*3/uL (ref 1.7–7.7)
Neutrophils Relative %: 51 %
Platelets: 323 10*3/uL (ref 150–400)
RBC: 5.41 MIL/uL — ABNORMAL HIGH (ref 3.87–5.11)
RDW: 20.4 % — ABNORMAL HIGH (ref 11.5–15.5)
WBC: 11.4 10*3/uL — ABNORMAL HIGH (ref 4.0–10.5)
nRBC: 0 % (ref 0.0–0.2)

## 2020-02-14 LAB — SARS CORONAVIRUS 2 BY RT PCR (HOSPITAL ORDER, PERFORMED IN ~~LOC~~ HOSPITAL LAB): SARS Coronavirus 2: NEGATIVE

## 2020-02-14 LAB — MAGNESIUM: Magnesium: 2.3 mg/dL (ref 1.7–2.4)

## 2020-02-14 LAB — PROTIME-INR
INR: 1 (ref 0.8–1.2)
Prothrombin Time: 12.9 seconds (ref 11.4–15.2)

## 2020-02-14 LAB — APTT: aPTT: 26 seconds (ref 24–36)

## 2020-02-14 MED ORDER — DICLOFENAC SODIUM 1 % EX GEL
2.0000 g | Freq: Four times a day (QID) | CUTANEOUS | Status: DC
  Administered 2020-02-15 – 2020-02-21 (×15): 2 g via TOPICAL

## 2020-02-14 MED ORDER — DICLOFENAC SODIUM 1 % TD GEL
2.0000 g | Freq: Four times a day (QID) | TRANSDERMAL | Status: DC
  Filled 2020-02-14: qty 100

## 2020-02-14 MED ORDER — OXYCODONE HCL 5 MG PO TABS
5.0000 mg | ORAL_TABLET | ORAL | Status: DC | PRN
  Administered 2020-02-15 – 2020-02-17 (×3): 10 mg via ORAL
  Administered 2020-02-17: 5 mg via ORAL
  Administered 2020-02-17 – 2020-02-18 (×4): 10 mg via ORAL
  Administered 2020-02-18: 5 mg via ORAL
  Administered 2020-02-19 – 2020-02-20 (×3): 10 mg via ORAL
  Administered 2020-02-22: 5 mg via ORAL
  Filled 2020-02-14 (×8): qty 2
  Filled 2020-02-14 (×2): qty 1
  Filled 2020-02-14 (×2): qty 2
  Filled 2020-02-14: qty 1

## 2020-02-14 MED ORDER — LUBIPROSTONE 24 MCG PO CAPS
24.0000 ug | ORAL_CAPSULE | Freq: Two times a day (BID) | ORAL | Status: DC
  Administered 2020-02-15 – 2020-02-22 (×12): 24 ug via ORAL
  Filled 2020-02-14 (×18): qty 1

## 2020-02-14 MED ORDER — ATENOLOL 25 MG PO TABS
50.0000 mg | ORAL_TABLET | Freq: Every day | ORAL | Status: DC
  Administered 2020-02-15 – 2020-02-22 (×8): 50 mg via ORAL
  Filled 2020-02-14 (×8): qty 2

## 2020-02-14 MED ORDER — PANTOPRAZOLE SODIUM 40 MG PO TBEC
40.0000 mg | DELAYED_RELEASE_TABLET | Freq: Every day | ORAL | Status: DC
  Administered 2020-02-15 – 2020-02-22 (×8): 40 mg via ORAL
  Filled 2020-02-14 (×8): qty 1

## 2020-02-14 MED ORDER — POTASSIUM CHLORIDE IN NACL 20-0.9 MEQ/L-% IV SOLN
INTRAVENOUS | Status: DC
  Filled 2020-02-14 (×5): qty 1000

## 2020-02-14 MED ORDER — HEPARIN SODIUM (PORCINE) 5000 UNIT/ML IJ SOLN
5000.0000 [IU] | Freq: Three times a day (TID) | INTRAMUSCULAR | Status: DC
  Administered 2020-02-14 – 2020-02-15 (×2): 5000 [IU] via SUBCUTANEOUS
  Filled 2020-02-14 (×3): qty 1

## 2020-02-14 MED ORDER — GABAPENTIN 600 MG PO TABS
1200.0000 mg | ORAL_TABLET | Freq: Three times a day (TID) | ORAL | Status: DC
  Administered 2020-02-14 – 2020-02-22 (×22): 1200 mg via ORAL
  Filled 2020-02-14 (×25): qty 2

## 2020-02-14 MED ORDER — SODIUM CHLORIDE 0.9% FLUSH: 3.0000 mL | INTRAVENOUS | Status: DC | PRN

## 2020-02-14 MED ORDER — SENNA 8.6 MG PO TABS
1.0000 | ORAL_TABLET | Freq: Two times a day (BID) | ORAL | Status: DC
  Administered 2020-02-14 – 2020-02-22 (×14): 8.6 mg via ORAL
  Filled 2020-02-14 (×14): qty 1

## 2020-02-14 MED ORDER — DIPHENHYDRAMINE HCL 25 MG PO CAPS
50.0000 mg | ORAL_CAPSULE | Freq: Four times a day (QID) | ORAL | Status: DC | PRN
  Administered 2020-02-15 – 2020-02-21 (×8): 50 mg via ORAL
  Filled 2020-02-14 (×10): qty 2

## 2020-02-14 MED ORDER — SODIUM CHLORIDE 0.9 % IV SOLN: 250.0000 mL | INTRAVENOUS | Status: DC | PRN

## 2020-02-14 MED ORDER — FUROSEMIDE 20 MG PO TABS
20.0000 mg | ORAL_TABLET | Freq: Every day | ORAL | Status: DC
  Administered 2020-02-15 – 2020-02-22 (×7): 20 mg via ORAL
  Filled 2020-02-14 (×7): qty 1

## 2020-02-14 MED ORDER — CELECOXIB 200 MG PO CAPS
200.0000 mg | ORAL_CAPSULE | Freq: Two times a day (BID) | ORAL | Status: DC
  Administered 2020-02-14 – 2020-02-22 (×15): 200 mg via ORAL
  Filled 2020-02-14 (×16): qty 1

## 2020-02-14 MED ORDER — ONDANSETRON HCL 4 MG PO TABS: 4.0000 mg | ORAL_TABLET | Freq: Four times a day (QID) | ORAL | Status: DC | PRN

## 2020-02-14 MED ORDER — SODIUM CHLORIDE 0.9% FLUSH
3.0000 mL | Freq: Two times a day (BID) | INTRAVENOUS | Status: DC
  Administered 2020-02-14 – 2020-02-18 (×6): 3 mL via INTRAVENOUS

## 2020-02-14 MED ORDER — ONDANSETRON HCL 4 MG/2ML IJ SOLN: 4.0000 mg | Freq: Four times a day (QID) | INTRAMUSCULAR | Status: DC | PRN

## 2020-02-14 MED ORDER — ADULT MULTIVITAMIN W/MINERALS CH
1.0000 | ORAL_TABLET | Freq: Every day | ORAL | Status: DC
  Administered 2020-02-15 – 2020-02-22 (×7): 1 via ORAL
  Filled 2020-02-14 (×7): qty 1

## 2020-02-14 MED ORDER — CALCIUM CARBONATE-VITAMIN D 500-200 MG-UNIT PO TABS
1.0000 | ORAL_TABLET | Freq: Two times a day (BID) | ORAL | Status: DC
  Administered 2020-02-14 – 2020-02-22 (×14): 1 via ORAL
  Filled 2020-02-14 (×15): qty 1

## 2020-02-14 MED ORDER — ASPIRIN EC 81 MG PO TBEC
81.0000 mg | DELAYED_RELEASE_TABLET | Freq: Every day | ORAL | Status: DC
  Administered 2020-02-15 – 2020-02-22 (×7): 81 mg via ORAL
  Filled 2020-02-14 (×7): qty 1

## 2020-02-14 MED ORDER — CHLORZOXAZONE 500 MG PO TABS
500.0000 mg | ORAL_TABLET | Freq: Three times a day (TID) | ORAL | Status: DC
  Administered 2020-02-15 – 2020-02-22 (×19): 500 mg via ORAL
  Filled 2020-02-14 (×28): qty 1

## 2020-02-14 MED ORDER — MORPHINE SULFATE (PF) 4 MG/ML IV SOLN
10.0000 mg | Freq: Once | INTRAVENOUS | Status: AC
  Administered 2020-02-14: 10 mg via INTRAVENOUS
  Filled 2020-02-14: qty 3

## 2020-02-14 MED ORDER — ALBUTEROL SULFATE HFA 108 (90 BASE) MCG/ACT IN AERS
2.0000 | INHALATION_SPRAY | Freq: Four times a day (QID) | RESPIRATORY_TRACT | Status: DC | PRN
  Filled 2020-02-14: qty 6.7

## 2020-02-14 MED ORDER — POTASSIUM CHLORIDE CRYS ER 10 MEQ PO TBCR
10.0000 meq | EXTENDED_RELEASE_TABLET | Freq: Every day | ORAL | Status: DC
  Administered 2020-02-14 – 2020-02-22 (×8): 10 meq via ORAL
  Filled 2020-02-14 (×10): qty 1

## 2020-02-14 MED ORDER — HYDROCHLOROTHIAZIDE 25 MG PO TABS
25.0000 mg | ORAL_TABLET | Freq: Every day | ORAL | Status: DC
  Administered 2020-02-15 – 2020-02-22 (×7): 25 mg via ORAL
  Filled 2020-02-14 (×7): qty 1

## 2020-02-14 MED ORDER — CALCIUM-VITAMIN D 500-125 MG-UNIT PO TABS: 1.0000 | ORAL_TABLET | Freq: Two times a day (BID) | ORAL | Status: DC

## 2020-02-14 NOTE — ED Provider Notes (Signed)
[facility] Provider Note   CSN: [id number] Arrival date & time: 02/14/20  1549     History No chief complaint on file.   [name] is a 59 y.o. female.  HPI Patient is a 59 year old female with medical history of neuropathy, L3/4 decompression and lumbar fusion 7 months ago, who presents for acute onset of bilateral lower extremity sciatic pain and weakness for the past week.  Prior to last Sunday, 8 days ago, she was walking with a cane and felt like she had good strength in her legs.  Beginning on Sunday, she has experienced bilateral lower extremity weakness, worse than her right.  She has been having a difficult time ambulating with a walker.  Today, she felt complete loss of strength in her right lower extremity.  She suffered a fall, for which she sustained a abrasion to her right knee.  She was unable to get up without the assistance of a neighbor.  She denies any bladder or bowel incontinence.  She has chronic neck pain and upper extremity weakness.  She denies any recent infectious symptoms.    Past Medical History:  Diagnosis Date  . Anemia   . Anemia   . Ankle syndesmosis disruption   . Anxiety    takes Ativan and Valium, after mother passed  . Arthritis    bilateral knees s/p knee replacement bilaterally  . Asthma    but pt states not severe enough to even have an inhaler  . Bronchitis   . Bruising    pt states unexplained d/t fibromyalgia  . Chronic back pain    2012 tailbone surgery and 3 lower discs.   . Closed fracture of distal end of right fibula and tibia   . Depression    from Fibromyalgia diagnosis; not taking medicine. since 2001  . Dizziness    rarely  . Fibromyalgia    diagnosed 2001  . GERD (gastroesophageal reflux disease)    Prilosec occasionally  . Headache(784.0)    "sinus headaches"  . History of hiatal hernia   . Hypertension    since 2013  . Hyperthyroidism    subclinical, no treatment;  thyroid nodules  . IBS (irritable bowel syndrome)   . Impaired memory    states from fibromyalgia  . Insomnia    takes Ambien  . Jones fracture    left foot fifth metatarsal  . Multiple allergies    including latex, pet dander, shellfish, pet dander  . Painful orthopaedic hardware right ankle 05/27/2016  . Seasonal allergies   . Shortness of breath    with exertion;pt states that its related to her weight  . Sore gums    this is why pt is on Amoxil-only takes for dental work  . Tachycardia   . Thyroid goiter   . Varicose vein    protrudes above skin-per pt;vein popped and bruised;ultrasound done to make sure that there were no clots;noclots were found    Patient Active Problem List   Diagnosis Date Noted  . Muscle weakness of lower extremity 02/14/2020  . Spondylolisthesis of lumbar region 10/30/2017  . Painful orthopaedic hardware right ankle 05/27/2016  . Closed fracture of distal end of right fibula and tibia   . Ankle syndesmosis disruption   . ASCUS favor benign 01/06/2014  . Leg swelling 12/22/2013  . Seasonal allergies   . Anemia   . Chronic back pain due to DJD with history surgery   . Hyperthyroidism   .  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 Status post left total prosthetic replacement of knee joint using cement 03/24/2012  . Fibromyalgia   . GERD (gastroesophageal reflux disease)   . Anxiety   . Depression   . Hypertension   . Lumbar degenerative disc disease 09/25/2011    Past Surgical History:  Procedure Laterality Date  . ANTERIOR LUMBAR FUSION  09/20/2011   Procedure: ANTERIOR LUMBAR FUSION 1 LEVEL;  Surgeon: Carmela Hurt;  Location: [facility];  Service: Neurosurgery;  Laterality: N/A;  Lumbar five-Sacral One Anterior Lumbar Interbody Fusion /Dr. Early to Approach   . CERVICAL DISC SURGERY     WITH TITANIUM PLATE IN NECK---LEFT SIDE  . CERVICAL SPINE SURGERY  03/24/2019  . CESAREAN SECTION    . fibroidectomy      . FRACTURE SURGERY  05/08/2010   Jones fracture left foot fifth metatarsal  . HARDWARE REMOVAL Right 05/28/2016   Procedure: RIGHT ANKLE HARDWARE REMOVAL;  Surgeon: Salvatore Marvel, MD;  Location: [facility];  Service: Orthopedics;  Laterality: Right;  . IRRIGATION AND DEBRIDEMENT KNEE  04/09/2012   Procedure: IRRIGATION AND DEBRIDEMENT KNEE;  Surgeon: Thera Flake., MD;  Location: [facility];  Service: Orthopedics;  Laterality: Right;  . JOINT REPLACEMENT  01-27-2012   left total knee and Right total knee  . KNEE SURGERY  2005   Left knee arthroscopy  . NASAL SEPTOPLASTY W/ TURBINOPLASTY  2007   due to recurrent sinusitis  . ORIF ANKLE FRACTURE Right 06/21/2015   Procedure: OPEN REDUCTION INTERNAL FIXATION RIGHT DISTAL FIBULA  FRACTURE AND OPEN REDUCTION INTERNAL FIXATION SYNDESMOSIS ;  Surgeon: Salvatore Marvel, MD;  Location: [facility];  Service: Orthopedics;  Laterality: Right;  . SHOULDER ARTHROSCOPY W/ ROTATOR CUFF REPAIR Right   . SHOULDER SURGERY Left   . SPINE SURGERY  2004   Cervical plate, ACDF  . STERIOD INJECTION  01/27/2012   Procedure: STEROID INJECTION;  Surgeon: Nilda Simmer, MD;  Location: [facility];  Service: Orthopedics;  Laterality: Right;  . TONSILLECTOMY  2007  . TOTAL KNEE ARTHROPLASTY  01/27/2012   Procedure: TOTAL KNEE ARTHROPLASTY;  Surgeon: Nilda Simmer, MD;  Bilateral  . TOTAL KNEE ARTHROPLASTY  04/06/2012   Procedure: TOTAL KNEE ARTHROPLASTY;  Surgeon: Nilda Simmer, MD;  Location: [facility];  Service: Orthopedics;  Laterality: Right;  . TUBAL LIGATION    . UTERINE FIBROID SURGERY     mid 200s     OB History   No obstetric history on file.     Family History  Problem Relation Age of Onset  . Stroke Father        passed 2004 from pneumonia  . Alcohol abuse Father   . Pulmonary embolism Mother        after minor knee surgery leading to DVT  . Arthritis Sister   . Depression Sister   . Hypertension Sister   . Diabetes Other         grandmother  . Anesthesia problems Neg Hx   . Hypotension Neg Hx   . Malignant hyperthermia Neg Hx   . Pseudochol deficiency Neg Hx     Social History   Tobacco Use  . Smoking status: Former Smoker    Packs/day: 0.50    Years: 10.00    Pack years: 5.00    Quit date: 12/06/1998    Years since quitting: 21.2  . Smokeless tobacco: Never Used  Substance Use  Topics  . Alcohol use: No  . Drug use: No    Home Medications Prior to Admission medications   Medication Sig Start Date End Date Taking? Authorizing Provider  albuterol (PROVENTIL HFA;VENTOLIN HFA) 108 (90 BASE) MCG/ACT inhaler Inhale 2 puffs into the lungs every 6 (six) hours as needed for wheezing or shortness of breath. 01/12/14  Yes Shelva Majestic, MD  AMITIZA 24 MCG capsule Take 24 mcg by mouth 2 (two) times daily with a meal.  11/29/13  Yes [provider]  aspirin EC 81 MG tablet Take 81 mg by mouth daily.   Yes [provider]  atenolol (TENORMIN) 50 MG tablet Take 50 mg by mouth daily.   Yes [provider]  CALCIUM-VITAMIN D PO Take 1 tablet by mouth 2 (two) times daily.   Yes [provider]  CELEBREX 200 MG capsule Take 200 mg by mouth 2 (two) times daily.   Yes [provider]  chlorzoxazone (PARAFON) 500 MG tablet Take 500 mg by mouth 3 (three) times daily.   Yes [provider]  cyclobenzaprine (FLEXERIL) 10 MG tablet Take 10 mg by mouth 3 (three) times daily.   Yes [provider]  diazepam (VALIUM) 5 MG tablet Take 5 mg by mouth 2 (two) times daily.   Yes [provider]  diclofenac sodium (VOLTAREN) 1 % GEL Apply 1 application topically 2 (two) times daily as needed (shoulder, arm and leg pain). For right shoulder and arm and legs Patient taking differently: Apply 2 g topically 4 (four) times daily.  01/12/14  Yes Shelva Majestic, MD  diphenhydrAMINE (BENADRYL) 25 MG tablet Take 50 mg by mouth every 6 (six) hours as needed for allergies.    Yes  [provider]  diphenhydramine-acetaminophen (TYLENOL PM) 25-500 MG TABS tablet Take 2 tablets by mouth at bedtime as needed (sleep/pain).   Yes [provider]  furosemide (LASIX) 20 MG tablet Take 20 mg by mouth daily.   Yes [provider]  gabapentin (NEURONTIN) 600 MG tablet Take 1,200 mg by mouth 3 (three) times daily.  10/17/17  Yes [provider]  hydrochlorothiazide (HYDRODIURIL) 25 MG tablet Take 1 tablet (25 mg total) by mouth daily. One tablet by mouth in the morning. Patient taking differently: Take 25 mg by mouth daily.  01/04/14  Yes Shelva Majestic, MD  hydrOXYzine (ATARAX/VISTARIL) 50 MG tablet Take 50 mg by mouth 2 (two) times daily.   Yes [provider]  methocarbamol (ROBAXIN) 500 MG tablet Take 500 mg by mouth 4 (four) times daily.   Yes [provider]  Multiple Vitamin (MULTIVITAMIN WITH MINERALS) TABS tablet Take 1 tablet by mouth daily.   Yes [provider]  omeprazole (PRILOSEC) 20 MG capsule Take 20 mg by mouth 2 (two) times daily before a meal.   Yes [provider]  ondansetron (ZOFRAN-ODT) 8 MG disintegrating tablet Take 8 mg by mouth 2 (two) times daily as needed for nausea or vomiting.   Yes [provider]  OVER THE COUNTER MEDICATION Take 1 tablet by mouth daily. Neuriva Brain Supplement   Yes [provider]  oxyCODONE-acetaminophen (PERCOCET) 10-325 MG tablet 1 tablet if needed for pain. Patient taking differently: Take 1 tablet by mouth 3 (three) times daily.  11/04/17  Yes Coletta Memos, MD  senna (SENOKOT) 8.6 MG TABS tablet Take 1 tablet by mouth 2 (two) times daily.   Yes [provider]  tiZANidine (ZANAFLEX) 4 MG tablet Take  1 tablet (4 mg total) by mouth every 6 (six) hours as needed for muscle spasms. Patient not taking: Reported on 07/06/2019 02/24/19   Eyvonne Mechanic, PA-C    Allergies    Monosodium glutamate, Shellfish allergy, Celebrex [celecoxib],  Contrast media [iodinated diagnostic agents], Diclofenac, Other, and Latex  Review of Systems   Review of Systems  Constitutional: Positive for activity change. Negative for chills and fever.  HENT: Negative for ear pain and sore throat.   Eyes: Negative for pain and visual disturbance.  Respiratory: Negative for cough and shortness of breath.   Cardiovascular: Negative for chest pain and palpitations.  Gastrointestinal: Negative for abdominal pain, nausea and vomiting.  Genitourinary: Negative for dysuria and hematuria.  Musculoskeletal: Positive for back pain and gait problem. Negative for arthralgias.  Skin: Negative for color change and rash.  Neurological: Positive for weakness. Negative for seizures, syncope and numbness.  Hematological: Does not bruise/bleed easily.  All other systems reviewed and are negative.   Physical Exam Updated Vital Signs BP 119/67 (BP Location: Right Arm)   Pulse 80   Temp 98 F (36.7 C) (Oral)   Resp 18   LMP 02/18/2016   SpO2 99%   Physical Exam Vitals and nursing note reviewed.  Constitutional:      General: She is not in acute distress.    Appearance: She is well-developed. She is obese. She is not ill-appearing, toxic-appearing or diaphoretic.  HENT:     Head: Normocephalic and atraumatic.     Right Ear: External ear normal.     Left Ear: External ear normal.     Nose: Nose normal.     Mouth/Throat:     Mouth: Mucous membranes are moist.     Pharynx: Oropharynx is clear.  Eyes:     General: No scleral icterus.    Conjunctiva/sclera: Conjunctivae normal.     Pupils: Pupils are equal, round, and reactive to light.  Cardiovascular:     Rate and Rhythm: Normal rate and regular rhythm.     Heart sounds: No murmur.  Pulmonary:     Effort: Pulmonary effort is normal. No respiratory distress.     Breath sounds: Normal breath sounds. No wheezing or rales.  Abdominal:     General: There is no distension.     Palpations: Abdomen is soft.      Tenderness: There is no abdominal tenderness. There is no guarding or rebound.  Musculoskeletal:        General: No deformity or signs of injury.     Cervical back: Neck supple. No tenderness.  Skin:    General: Skin is warm and dry.  Neurological:     Mental Status: She is alert and oriented to person, place, and time.     Cranial Nerves: No cranial nerve deficit.     Sensory: Sensory deficit present.     Motor: Weakness present.     Gait: Gait abnormal.     Comments: 4/5 strength in BUE, 2/5 strength in LLE, and 1/5 strength in RLE  Psychiatric:        Mood and Affect: Mood normal.        Behavior: Behavior normal.     ED Results / Procedures / Treatments   Labs (all labs ordered are listed, but only abnormal results are displayed) Labs Reviewed  CBC WITH DIFFERENTIAL/PLATELET - Abnormal; Notable for the following components:      Result Value   WBC 11.4 (*)    RBC 5.41 (*)  MCV 77.1 (*)    MCH 22.9 (*)    MCHC 29.7 (*)    RDW 20.4 (*)    Lymphs Abs 4.1 (*)    Monocytes Absolute 1.1 (*)    All other components within normal limits  BASIC METABOLIC PANEL - Abnormal; Notable for the following components:   Potassium 2.4 (*)    Anion gap 17 (*)    All other components within normal limits  SARS CORONAVIRUS 2 BY RT PCR (HOSPITAL ORDER, PERFORMED IN [facility])  SURGICAL PCR SCREEN  MAGNESIUM  HIV ANTIBODY (ROUTINE TESTING W REFLEX)  APTT  PROTIME-INR    EKG None  Radiology MR THORACIC SPINE WO CONTRAST  Result Date: 02/14/2020 CLINICAL DATA:  Back pain with progressive neurological deficit. EXAM: MRI THORACIC SPINE WITHOUT CONTRAST TECHNIQUE: Multiplanar, multisequence MR imaging of the thoracic spine was performed. No intravenous contrast was administered. COMPARISON:  None. FINDINGS: Alignment:  Thoracic alignment within normal limits. Vertebrae: No fracture or primary bone lesion. Cord:  No primary cord lesion.  See below regarding stenosis.  Paraspinal and other soft tissues: Negative Disc levels: No abnormality from T1-2 through T4-5. From T5-6 through T9-10, there is disc degeneration with bulging of the disc but no compressive stenosis of the canal or foramina. At T10-11, there is a central disc herniation that effaces the ventral subarachnoid space and indents the ventral cord. Question early T2 signal in the cord at this level. This could be significant. There is foraminal narrowing left more than right. T11-12 and T12-L1 show mild disc bulges and ordinary facet hypertrophy but no compressive stenosis. IMPRESSION: At T10-11, there is a central disc herniation that effaces the ventral subarachnoid space and indents the ventral aspect of the cord. Question early T2 signal in the cord at this level. This could be a significant lesion. Lesser, grossly non-compressive degenerative changes at the other levels as outlined above. Electronically Signed   By: Paulina FusiMark  Shogry M.D.   On: 02/14/2020 18:42   MR LUMBAR SPINE WO CONTRAST  Result Date: 02/14/2020 CLINICAL DATA:  Low back pain.  Grafts neurological deficit. EXAM: MRI LUMBAR SPINE WITHOUT CONTRAST TECHNIQUE: Multiplanar, multisequence MR imaging of the lumbar spine was performed. No intravenous contrast was administered. COMPARISON:  CT 06/11/2019.  MRI 03/10/2019. FINDINGS: Segmentation: 5 lumbar type vertebral bodies as numbered previously. Alignment:  No malalignment. Vertebrae: Previous fusion procedures from L3 to the sacrum. No fracture Conus medullaris and cauda equina: Conus extends to the L2 level. Conus and cauda equina appear normal. Paraspinal and other soft tissues: Negative Disc levels: L1-2: Mild disc bulge. Mild facet hypertrophy. No compressive stenosis. L2-3: Left posterolateral disc herniation with upward migration of a large fragment behind L2 to the left of midline. This compresses thecal sac and would likely cause left-sided symptoms affecting at least the left L2 nerve and  possibly other left-sided nerves. There is facet arthropathy at this level and there is likely to be abnormal motion. The central canal is stenotic and could result in other nerve compression. L3-4: Previous discectomy and fusion procedure. Wide patency of the canal and foramina. L4-5: Previous discectomy and fusion procedure. Wide patency of the canal and foramina. L5-S1: Previous discectomy and fusion procedure. Wide patency of the canal and foramina. IMPRESSION: Previous discectomy and fusion from L3 to the sacrum. Good appearance in that region. Adjacent segment pathology at L2-3. Disc degeneration with circumferential protrusion. Left posterolateral herniation with upward migration of a large fragment in the left lateral recess  behind L2. Bilateral facet arthropathy. Spinal stenosis at the disc level. Left-sided neural compression quite likely because of the upper migrated disc fragment. Because of the facet arthropathy, abnormal motion probably occurs at this level with flexion and extension. Electronically Signed   By: Paulina Fusi M.D.   On: 02/14/2020 18:47    Procedures Procedures (including critical care time)  Medications Ordered in ED Medications  aspirin EC tablet 81 mg (81 mg Oral Given 02/15/20 0932)  celecoxib (CELEBREX) capsule 200 mg (200 mg Oral Given 02/15/20 0934)  atenolol (TENORMIN) tablet 50 mg (50 mg Oral Given 02/15/20 0932)  furosemide (LASIX) tablet 20 mg (20 mg Oral Given 02/15/20 0933)  hydrochlorothiazide (HYDRODIURIL) tablet 25 mg (25 mg Oral Given 02/15/20 0933)  lubiprostone (AMITIZA) capsule 24 mcg (24 mcg Oral Given 02/15/20 1620)  pantoprazole (PROTONIX) EC tablet 40 mg (40 mg Oral Given 02/15/20 0932)  senna (SENOKOT) tablet 8.6 mg (8.6 mg Oral Given 02/15/20 0933)  chlorzoxazone (PARAFON) tablet 500 mg (500 mg Oral Given 02/15/20 1536)  gabapentin (NEURONTIN) tablet 1,200 mg (1,200 mg Oral Given 02/15/20 1528)  multivitamin with minerals tablet 1 tablet (1 tablet Oral  Given 02/15/20 0932)  potassium chloride (KLOR-CON) CR tablet 10 mEq (10 mEq Oral Given 02/15/20 1422)  albuterol (VENTOLIN HFA) 108 (90 Base) MCG/ACT inhaler 2 puff (has no administration in time range)  diphenhydrAMINE (BENADRYL) capsule 50 mg (50 mg Oral Given 02/15/20 1528)  0.9 % NaCl with KCl 20 mEq/ L  infusion ( Intravenous New Bag/Given 02/15/20 1517)  sodium chloride flush (NS) 0.9 % injection 3 mL (3 mLs Intravenous Given 02/15/20 0935)  sodium chloride flush (NS) 0.9 % injection 3 mL (has no administration in time range)  0.9 %  sodium chloride infusion (has no administration in time range)  ondansetron (ZOFRAN) tablet 4 mg (has no administration in time range)    Or  ondansetron (ZOFRAN) injection 4 mg (has no administration in time range)  oxyCODONE (Oxy IR/ROXICODONE) immediate release tablet 5-10 mg (10 mg Oral Given 02/15/20 1421)  calcium-vitamin D (OSCAL WITH D) 500-200 MG-UNIT per tablet 1 tablet (1 tablet Oral Given 02/15/20 0934)  diclofenac Sodium (VOLTAREN) 1 % topical gel 2 g (2 g Topical Not Given 02/15/20 1730)  dexamethasone (DECADRON) tablet 4 mg ( Oral See Alternative 02/15/20 2105)    Or  dexamethasone (DECADRON) injection 4 mg (4 mg Intravenous Given 02/15/20 2105)  morphine 4 MG/ML injection 10 mg (10 mg Intravenous Given 02/14/20 1715)    ED Course  I have reviewed the triage vital signs and the nursing notes.  Pertinent labs & imaging results that were available during my care of the patient were reviewed by me and considered in my medical decision making (see chart for details).    MDM Rules/Calculators/A&P                      Patient is a 59 year old female with history of lumbar spine fusion who presents for acute on chronic bilateral leg weakness, R>L.She denies recent infections symptoms. She denies bladder or bowel dysfunction.Upon arrival in the ED, her vital signs are normal. On exam, she is well appearing. She has 4/5 strength in BUE, 2/5 strength in  LLE, and 1/5 strength in RLE. Weakness is throughout entire legs. Negative Hoover's sign. She has old tenderness at T4 and new tenderness at T2.   Basic labs were ordered. Neurosurgery was consulted. Per their guidance, MRIs W/WO were ordered of lumbar and  thoracic spines. While patient was at MR, she stated that she was allergic to MR contrast. Noncontrasted images were obtained.  MRIs showed L2-L3 disc herniation and upward migration of large fragment, compressing on thecal sac; as well as T10-11 disc herniation that indents the ventral aspect of cord. There is possible early T2 signal in the cord at this level.  After patient arrived back in ED, She was found to be hypokalemic. At this point she was admitted to neurosurgery. IV catheter was placed under ultrasound guidance.  Potassium replacement was deferred to admitting team.  Final Clinical Impression(s) / ED Diagnoses Final diagnoses:  Lumbar disc herniation  Hypokalemia    Rx / DC Orders ED Discharge Orders    None       Gloris Manchester, MD 02/15/20 1191    Eber Hong, MD 02/16/20 1420

## 2020-02-14 NOTE — ED Provider Notes (Signed)
I saw and evaluated the patient, reviewed the resident's note and I agree with the findings and plan.  Pertinent History: This patient is a 58 year old female, very pleasant, unfortunately has struggled with multiple back problems over time including multiple back surgery procedures.  Most recently she was operated on in October last year.  Since that time she states that she has been "bedbound".  She tries to walk but because of her significant difficulty walking she essentially does not.  She reports that she has fallen every single day this week which prompted her visit because she is tired of falling and cannot live like this.  She has constant chronic pain and currently feels like her pain is in her lower back radiating down the bilateral legs posteriorly through the buttock and down the posterior legs.  On my exam the patient is not able to lift either leg off the bed in a straight leg form, she can bend her knee on the left much better than the one on the right.  She has sensation intact to both legs but has an area of numbness in the left proximal anterior thigh.  She is able to lift both arms, she has normal grips though she does report that she is having intermittent spells of dropping things with her hands as well.  Her mental status is alert, she is not tachycardic febrile or tachypneic, her blood pressure is 136/109.  The patient will need an MRI and consultation with neurosurgery given her worsening neurologic function.  The patient had a significant hypokalemia which was treated with replacement potassium, MRI does reveal that the patient has evidence of disc herniation, Dr. Mikal Plane has been kind enough to come see the patient and admit to the hospital for further evaluation and treatment.  I was personally present and directly supervised the following procedures:  Medical evaluation  I personally interpreted the EKG as well as the resident and agree with the interpretation on the  resident's chart.  Final diagnoses:  Lumbar disc herniation  Hypokalemia      Eber Hong, MD 02/16/20 1420

## 2020-02-14 NOTE — H&P (Signed)
[name] is an 59 y.o. female.   Chief Complaint: weakness right lower extremity HPI: multiple falls last 2 weeks, legs, especially right side give out. Unable to stand without a walker. Larey Seat this morning on front steps, needed help  From neighbor. No bowel/bladder dysfunction. Increased pain right lower extremity. Admit secondary  To mri findings, need for surgery.   Past Medical History:  Diagnosis Date  . Anemia   . Anemia   . Ankle syndesmosis disruption   . Anxiety    takes Ativan and Valium, after mother passed  . Arthritis    bilateral knees s/p knee replacement bilaterally  . Asthma    but pt states not severe enough to even have an inhaler  . Bronchitis   . Bruising    pt states unexplained d/t fibromyalgia  . Chronic back pain    2012 tailbone surgery and 3 lower discs.   . Closed fracture of distal end of right fibula and tibia   . Depression    from Fibromyalgia diagnosis; not taking medicine. since 2001  . Dizziness    rarely  . Fibromyalgia    diagnosed 2001  . GERD (gastroesophageal reflux disease)    Prilosec occasionally  . Headache(784.0)    "sinus headaches"  . History of hiatal hernia   . Hypertension    since 2013  . Hyperthyroidism    subclinical, no treatment; thyroid nodules  . IBS (irritable bowel syndrome)   . Impaired memory    states from fibromyalgia  . Insomnia    takes Ambien  . Jones fracture    left foot fifth metatarsal  . Multiple allergies    including latex, pet dander, shellfish, pet dander  . Painful orthopaedic hardware right ankle 05/27/2016  . Seasonal allergies   . Shortness of breath    with exertion;pt states that its related to her weight  . Sore gums    this is why pt is on Amoxil-only takes for dental work  . Tachycardia   . Thyroid goiter   . Varicose vein    protrudes above skin-per pt;vein popped and bruised;ultrasound done to make sure that there were no clots;noclots were found    Past Surgical  History:  Procedure Laterality Date  . ANTERIOR LUMBAR FUSION  09/20/2011   Procedure: ANTERIOR LUMBAR FUSION 1 LEVEL;  Surgeon: Carmela Hurt;  Location: [facility];  Service: Neurosurgery;  Laterality: N/A;  Lumbar five-Sacral One Anterior Lumbar Interbody Fusion /Dr. Early to Approach   . CERVICAL DISC SURGERY     WITH TITANIUM PLATE IN NECK---LEFT SIDE  . CERVICAL SPINE SURGERY  03/24/2019  . CESAREAN SECTION    . fibroidectomy    . FRACTURE SURGERY  05/08/2010   Jones fracture left foot fifth metatarsal  . HARDWARE REMOVAL Right 05/28/2016   Procedure: RIGHT ANKLE HARDWARE REMOVAL;  Surgeon: Salvatore Marvel, MD;  Location: [facility];  Service: Orthopedics;  Laterality: Right;  . IRRIGATION AND DEBRIDEMENT KNEE  04/09/2012   Procedure: IRRIGATION AND DEBRIDEMENT KNEE;  Surgeon: Thera Flake., MD;  Location: [facility];  Service: Orthopedics;  Laterality: Right;  . JOINT REPLACEMENT  01-27-2012   left total knee and Right total knee  . KNEE SURGERY  2005   Left knee arthroscopy  . NASAL SEPTOPLASTY W/ TURBINOPLASTY  2007   due to recurrent sinusitis  . ORIF ANKLE FRACTURE Right 06/21/2015   Procedure: OPEN REDUCTION INTERNAL FIXATION RIGHT DISTAL FIBULA  FRACTURE AND  OPEN REDUCTION INTERNAL FIXATION SYNDESMOSIS ;  Surgeon: Salvatore Marvel, MD;  Location: [facility];  Service: Orthopedics;  Laterality: Right;  . SHOULDER ARTHROSCOPY W/ ROTATOR CUFF REPAIR Right   . SHOULDER SURGERY Left   . SPINE SURGERY  2004   Cervical plate, ACDF  . STERIOD INJECTION  01/27/2012   Procedure: STEROID INJECTION;  Surgeon: Nilda Simmer, MD;  Location: [facility];  Service: Orthopedics;  Laterality: Right;  . TONSILLECTOMY  2007  . TOTAL KNEE ARTHROPLASTY  01/27/2012   Procedure: TOTAL KNEE ARTHROPLASTY;  Surgeon: Nilda Simmer, MD;  Bilateral  . TOTAL KNEE ARTHROPLASTY  04/06/2012   Procedure: TOTAL KNEE ARTHROPLASTY;  Surgeon: Nilda Simmer, MD;  Location: [facility];  Service:  Orthopedics;  Laterality: Right;  . TUBAL LIGATION    . UTERINE FIBROID SURGERY     mid 17s    Family History  Problem Relation Age of Onset  . Stroke Father        passed 2004 from pneumonia  . Alcohol abuse Father   . Pulmonary embolism Mother        after minor knee surgery leading to DVT  . Arthritis Sister   . Depression Sister   . Hypertension Sister   . Diabetes Other        grandmother  . Anesthesia problems Neg Hx   . Hypotension Neg Hx   . Malignant hyperthermia Neg Hx   . Pseudochol deficiency Neg Hx    Social History:  reports that she quit smoking about 21 years ago. She has a 5.00 pack-year smoking history. She has never used smokeless tobacco. She reports that she does not drink alcohol or use drugs.  Allergies:  Allergies  Allergen Reactions  . Monosodium Glutamate Anaphylaxis and Swelling    Eyes swollen shut, facial swelling, tongue swelling.  . Shellfish Allergy Anaphylaxis  . Celebrex [Celecoxib] Itching    Only allergic to generic brand  . Contrast Media [Iodinated Diagnostic Agents] Itching and Nausea Only    "could not walk"  . Diclofenac Itching    Can take the name brand Voltaren  . Other Other (See Comments)    Pet dander, Mildew, Mold  Cannot tolerate generic DICLOFENAC GEL--MUST USE BRAND NAME (GENERIC CAUSES ITCHING)   . Latex Itching and Rash    Latex glove with powder    (Not in a hospital admission)   Results for orders placed or performed during the hospital encounter of 02/14/20 (from the past 48 hour(s))  CBC with Differential     Status: Abnormal   Collection Time: 02/14/20  4:12 PM  Result Value Ref Range   WBC 11.4 (H) 4.0 - 10.5 K/uL   RBC 5.41 (H) 3.87 - 5.11 MIL/uL   Hemoglobin 12.4 12.0 - 15.0 g/dL   HCT 54.6 50.3 - 54.6 %   MCV 77.1 (L) 80.0 - 100.0 fL   MCH 22.9 (L) 26.0 - 34.0 pg   MCHC 29.7 (L) 30.0 - 36.0 g/dL   RDW 56.8 (H) 12.7 - 51.7 %   Platelets 323 150 - 400 K/uL   nRBC 0.0 0.0 - 0.2 %   Neutrophils  Relative % 51 %   Neutro Abs 5.8 1.7 - 7.7 K/uL   Lymphocytes Relative 36 %   Lymphs Abs 4.1 (H) 0.7 - 4.0 K/uL   Monocytes Relative 9 %   Monocytes Absolute 1.1 (H) 0.1 - 1.0 K/uL   Eosinophils Relative 3 %   Eosinophils Absolute 0.4  0.0 - 0.5 K/uL   Basophils Relative 1 %   Basophils Absolute 0.1 0.0 - 0.1 K/uL   Immature Granulocytes 0 %   Abs Immature Granulocytes 0.03 0.00 - 0.07 K/uL    Comment: Performed at [facility], [place] [address] [place], [place] [address]  Basic metabolic panel     Status: Abnormal   Collection Time: 02/14/20  4:12 PM  Result Value Ref Range   Sodium 139 135 - 145 mmol/L   Potassium 2.4 (LL) 3.5 - 5.1 mmol/L    Comment: CRITICAL RESULT CALLED TO, READ BACK BY AND VERIFIED WITH: L MEEKS RN [id number] K FORSYTH    Chloride 100 98 - 111 mmol/L   CO2 22 22 - 32 mmol/L   Glucose, Bld 83 70 - 99 mg/dL    Comment: Glucose reference range applies only to samples taken after fasting for at least 8 hours.   BUN 13 6 - 20 mg/dL   Creatinine, Ser 0.93 0.44 - 1.00 mg/dL   Calcium 9.8 8.9 - 10.3 mg/dL   GFR calc non Af Amer >60 >60 mL/min   GFR calc Af Amer >60 >60 mL/min   Anion gap 17 (H) 5 - 15    Comment: Performed at [facility] [address], [place], [place] [address]  Magnesium     Status: None   Collection Time: 02/14/20  4:12 PM  Result Value Ref Range   Magnesium 2.3 1.7 - 2.4 mg/dL    Comment: Performed at [facility] [address], [place], [place] [address]   MR THORACIC SPINE WO CONTRAST  Result Date: 02/14/2020 CLINICAL DATA:  Back pain with progressive neurological deficit. EXAM: MRI THORACIC SPINE WITHOUT CONTRAST TECHNIQUE: Multiplanar, multisequence MR imaging of the thoracic spine was performed. No intravenous contrast was administered. COMPARISON:  None. FINDINGS: Alignment:  Thoracic alignment within normal limits. Vertebrae: No fracture or primary bone lesion. Cord:  No primary cord lesion.  See below regarding  stenosis. Paraspinal and other soft tissues: Negative Disc levels: No abnormality from T1-2 through T4-5. From T5-6 through T9-10, there is disc degeneration with bulging of the disc but no compressive stenosis of the canal or foramina. At T10-11, there is a central disc herniation that effaces the ventral subarachnoid space and indents the ventral cord. Question early T2 signal in the cord at this level. This could be significant. There is foraminal narrowing left more than right. T11-12 and T12-L1 show mild disc bulges and ordinary facet hypertrophy but no compressive stenosis. IMPRESSION: At T10-11, there is a central disc herniation that effaces the ventral subarachnoid space and indents the ventral aspect of the cord. Question early T2 signal in the cord at this level. This could be a significant lesion. Lesser, grossly non-compressive degenerative changes at the other levels as outlined above. Electronically Signed   By: Nelson Chimes M.D.   On: 02/14/2020 18:42   MR LUMBAR SPINE WO CONTRAST  Result Date: 02/14/2020 CLINICAL DATA:  Low back pain.  Grafts neurological deficit. EXAM: MRI LUMBAR SPINE WITHOUT CONTRAST TECHNIQUE: Multiplanar, multisequence MR imaging of the lumbar spine was performed. No intravenous contrast was administered. COMPARISON:  CT 06/11/2019.  MRI 03/10/2019. FINDINGS: Segmentation: 5 lumbar type vertebral bodies as numbered previously. Alignment:  No malalignment. Vertebrae: Previous fusion procedures from L3 to the sacrum. No fracture Conus medullaris and cauda equina: Conus extends to the L2 level. Conus and cauda equina appear normal. Paraspinal and other soft tissues:  Negative Disc levels: L1-2: Mild disc bulge. Mild facet hypertrophy. No compressive stenosis. L2-3: Left posterolateral disc herniation with upward migration of a large fragment behind L2 to the left of midline. This compresses thecal sac and would likely cause left-sided symptoms affecting at least the left L2  nerve and possibly other left-sided nerves. There is facet arthropathy at this level and there is likely to be abnormal motion. The central canal is stenotic and could result in other nerve compression. L3-4: Previous discectomy and fusion procedure. Wide patency of the canal and foramina. L4-5: Previous discectomy and fusion procedure. Wide patency of the canal and foramina. L5-S1: Previous discectomy and fusion procedure. Wide patency of the canal and foramina. IMPRESSION: Previous discectomy and fusion from L3 to the sacrum. Good appearance in that region. Adjacent segment pathology at L2-3. Disc degeneration with circumferential protrusion. Left posterolateral herniation with upward migration of a large fragment in the left lateral recess behind L2. Bilateral facet arthropathy. Spinal stenosis at the disc level. Left-sided neural compression quite likely because of the upper migrated disc fragment. Because of the facet arthropathy, abnormal motion probably occurs at this level with flexion and extension. Electronically Signed   By: Paulina Fusi M.D.   On: 02/14/2020 18:47    Review of Systems  Constitutional: Positive for activity change.  HENT: Negative.   Eyes: Negative.   Respiratory: Negative.   Cardiovascular: Negative.   Gastrointestinal: Negative.   Musculoskeletal: Positive for back pain.  Neurological: Positive for weakness.  Hematological: Negative.   Psychiatric/Behavioral: Negative.     Blood pressure (!) 136/109, pulse 70, temperature 98.3 F (36.8 C), temperature source Oral, resp. rate 18, last menstrual period 02/18/2016, SpO2 99 %. Physical Exam  Constitutional: She is oriented to person, place, and time.  Eyes: Pupils are equal, round, and reactive to light. Conjunctivae and EOM are normal.  Musculoskeletal:     Cervical back: Normal range of motion and neck supple.  Neurological: She is alert and oriented to person, place, and time. No cranial nerve deficit. Coordination  abnormal. She displays no Babinski's sign on the right side. She displays no Babinski's sign on the left side.  Profound weakness right lower extremity, 2/5 all muscle groups Left lower extremity 4-/5 Intact sensation  Skin: Skin is warm. There is erythema.  Multiple ecchymotic regions on limbs and left breast  Psychiatric: She has a normal mood and affect. Her behavior is normal. Judgment and thought content normal.     Assessment/Plan MRI  Shows large disc herniation at adjacent lumbar level L2/3. Central disc at T10/11 mildly deforms the cord but there is ample space behind the cord. Believe the real problem is the L2/3 disc given acute symptom presentation. Will need a discetomy and hardware extension. Will arrange while admitted.   Coletta Memos, MD 02/14/2020, 6:52 PM

## 2020-02-15 ENCOUNTER — Other Ambulatory Visit: Payer: Self-pay | Admitting: Neurosurgery

## 2020-02-15 LAB — HIV ANTIBODY (ROUTINE TESTING W REFLEX): HIV Screen 4th Generation wRfx: NONREACTIVE

## 2020-02-15 LAB — SURGICAL PCR SCREEN
MRSA, PCR: NEGATIVE
Staphylococcus aureus: NEGATIVE

## 2020-02-15 MED ORDER — DEXAMETHASONE SODIUM PHOSPHATE 4 MG/ML IJ SOLN
4.0000 mg | Freq: Four times a day (QID) | INTRAMUSCULAR | Status: DC
  Administered 2020-02-15 – 2020-02-20 (×5): 4 mg via INTRAVENOUS
  Filled 2020-02-15 (×4): qty 1

## 2020-02-15 MED ORDER — DEXAMETHASONE 4 MG PO TABS
4.0000 mg | ORAL_TABLET | Freq: Four times a day (QID) | ORAL | Status: DC
  Administered 2020-02-16 – 2020-02-22 (×22): 4 mg via ORAL
  Filled 2020-02-15 (×24): qty 1

## 2020-02-15 NOTE — Progress Notes (Signed)
Pt arrived to 3W13. VSS, A&Ox4. States 10/10 pain in her back, PRN oxycodone given. Skin with generalized bruising on her legs, abdomen, and back bilaterally, as well as 2 small abrasions on bilateral knees. Patient is refusing subcutaneous heparin at this time, states when she received it last night it made her left side feel weird and tingly. Patient educated on purpose of subq heparin for VTE prophylaxis and is willing to wear SCD's at this time.

## 2020-02-15 NOTE — ED Notes (Signed)
Lunch Tray Ordered 

## 2020-02-15 NOTE — Progress Notes (Signed)
Patient ID: [name], female   DOB: [birth year], 59 y.o.   MRN: [id number] BP 117/72 (BP Location: Right Arm)   Pulse 76   Temp 97.8 F (36.6 C) (Oral)   Resp 18   LMP 02/18/2016   SpO2 100%  Alert and oriented x 4 Weakness right lower extremity.  Exam is unchanged since admission.  Will give decadron prior to discetomy at T10/11, OR tomorrow for arthrodesis and plif at 2/3

## 2020-02-16 ENCOUNTER — Encounter (HOSPITAL_COMMUNITY): Payer: Self-pay | Admitting: Neurosurgery

## 2020-02-16 ENCOUNTER — Inpatient Hospital Stay (HOSPITAL_COMMUNITY): Payer: Medicare Other

## 2020-02-16 ENCOUNTER — Inpatient Hospital Stay (HOSPITAL_COMMUNITY): Payer: Medicare Other | Admitting: Anesthesiology

## 2020-02-16 ENCOUNTER — Encounter (HOSPITAL_COMMUNITY)
Admission: EM | Disposition: A | Discharge status: Home Health | Payer: Self-pay | Source: Home / Self Care | Attending: Neurosurgery

## 2020-02-16 DIAGNOSIS — E0500 Thyrotoxicosis with diffuse goiter without thyrotoxic crisis or storm: Secondary | ICD-10-CM | POA: Diagnosis present

## 2020-02-16 DIAGNOSIS — M5442 Lumbago with sciatica, left side: Secondary | ICD-10-CM | POA: Diagnosis present

## 2020-02-16 DIAGNOSIS — Z96653 Presence of artificial knee joint, bilateral: Secondary | ICD-10-CM | POA: Diagnosis present

## 2020-02-16 DIAGNOSIS — Z20822 Contact with and (suspected) exposure to covid-19: Secondary | ICD-10-CM | POA: Diagnosis present

## 2020-02-16 DIAGNOSIS — I10 Essential (primary) hypertension: Secondary | ICD-10-CM | POA: Diagnosis present

## 2020-02-16 DIAGNOSIS — G8929 Other chronic pain: Secondary | ICD-10-CM | POA: Diagnosis present

## 2020-02-16 DIAGNOSIS — M6281 Muscle weakness (generalized): Secondary | ICD-10-CM | POA: Diagnosis present

## 2020-02-16 DIAGNOSIS — M47814 Spondylosis without myelopathy or radiculopathy, thoracic region: Secondary | ICD-10-CM | POA: Diagnosis present

## 2020-02-16 DIAGNOSIS — R296 Repeated falls: Secondary | ICD-10-CM | POA: Diagnosis present

## 2020-02-16 DIAGNOSIS — Z981 Arthrodesis status: Secondary | ICD-10-CM | POA: Diagnosis not present

## 2020-02-16 DIAGNOSIS — M5126 Other intervertebral disc displacement, lumbar region: Secondary | ICD-10-CM | POA: Diagnosis present

## 2020-02-16 DIAGNOSIS — W19XXXA Unspecified fall, initial encounter: Secondary | ICD-10-CM | POA: Diagnosis present

## 2020-02-16 DIAGNOSIS — E876 Hypokalemia: Secondary | ICD-10-CM | POA: Diagnosis present

## 2020-02-16 DIAGNOSIS — S80211A Abrasion, right knee, initial encounter: Secondary | ICD-10-CM | POA: Diagnosis present

## 2020-02-16 DIAGNOSIS — G629 Polyneuropathy, unspecified: Secondary | ICD-10-CM | POA: Diagnosis present

## 2020-02-16 DIAGNOSIS — F329 Major depressive disorder, single episode, unspecified: Secondary | ICD-10-CM | POA: Diagnosis present

## 2020-02-16 DIAGNOSIS — M5124 Other intervertebral disc displacement, thoracic region: Secondary | ICD-10-CM | POA: Diagnosis present

## 2020-02-16 DIAGNOSIS — F419 Anxiety disorder, unspecified: Secondary | ICD-10-CM | POA: Diagnosis present

## 2020-02-16 DIAGNOSIS — M5441 Lumbago with sciatica, right side: Secondary | ICD-10-CM | POA: Diagnosis present

## 2020-02-16 DIAGNOSIS — K219 Gastro-esophageal reflux disease without esophagitis: Secondary | ICD-10-CM | POA: Diagnosis present

## 2020-02-16 DIAGNOSIS — M542 Cervicalgia: Secondary | ICD-10-CM | POA: Diagnosis present

## 2020-02-16 DIAGNOSIS — D649 Anemia, unspecified: Secondary | ICD-10-CM | POA: Diagnosis present

## 2020-02-16 DIAGNOSIS — K589 Irritable bowel syndrome without diarrhea: Secondary | ICD-10-CM | POA: Diagnosis present

## 2020-02-16 DIAGNOSIS — M797 Fibromyalgia: Secondary | ICD-10-CM | POA: Diagnosis present

## 2020-02-16 DIAGNOSIS — M479 Spondylosis, unspecified: Secondary | ICD-10-CM | POA: Diagnosis present

## 2020-02-16 HISTORY — PX: THORACIC DISCECTOMY: SHX6113

## 2020-02-16 LAB — TYPE AND SCREEN
ABO/RH(D): B POS
Antibody Screen: NEGATIVE

## 2020-02-16 LAB — BASIC METABOLIC PANEL
Anion gap: 11 (ref 5–15)
BUN: 8 mg/dL (ref 6–20)
CO2: 24 mmol/L (ref 22–32)
Calcium: 9.4 mg/dL (ref 8.9–10.3)
Chloride: 105 mmol/L (ref 98–111)
Creatinine, Ser: 0.56 mg/dL (ref 0.44–1.00)
GFR calc Af Amer: >60 mL/min (ref >60)
GFR calc non Af Amer: >60 mL/min (ref >60)
Glucose, Bld: 146 mg/dL — ABNORMAL HIGH (ref 70–99)
Potassium: 3.1 mmol/L — ABNORMAL LOW (ref 3.5–5.1)
Sodium: 140 mmol/L (ref 135–145)

## 2020-02-16 SURGERY — POSTERIOR LUMBAR FUSION 1 LEVEL
Anesthesia: General | Site: Back

## 2020-02-16 MED ORDER — LIDOCAINE-EPINEPHRINE 0.5 %-1:200000 IJ SOLN
INTRAMUSCULAR | Status: DC | PRN
  Administered 2020-02-16: 8 mL via INTRADERMAL

## 2020-02-16 MED ORDER — MIDAZOLAM HCL 2 MG/2ML IJ SOLN
INTRAMUSCULAR | Status: DC | PRN
  Administered 2020-02-16 (×2): 1 mg via INTRAVENOUS

## 2020-02-16 MED ORDER — DEXAMETHASONE SODIUM PHOSPHATE 10 MG/ML IJ SOLN
INTRAMUSCULAR | Status: AC
  Filled 2020-02-16: qty 1

## 2020-02-16 MED ORDER — OXYCODONE HCL 5 MG PO TABS: 5.0000 mg | ORAL_TABLET | Freq: Once | ORAL | Status: DC | PRN

## 2020-02-16 MED ORDER — CHLORHEXIDINE GLUCONATE CLOTH 2 % EX PADS: 6.0000 | MEDICATED_PAD | Freq: Once | CUTANEOUS | Status: DC

## 2020-02-16 MED ORDER — ONDANSETRON HCL 4 MG/2ML IJ SOLN: 4.0000 mg | Freq: Once | INTRAMUSCULAR | Status: DC | PRN

## 2020-02-16 MED ORDER — LIDOCAINE 2% (20 MG/ML) 5 ML SYRINGE
INTRAMUSCULAR | Status: AC
  Filled 2020-02-16: qty 5

## 2020-02-16 MED ORDER — ALBUMIN HUMAN 5 % IV SOLN
INTRAVENOUS | Status: DC | PRN
Start: 2020-02-16 — End: 2020-02-16

## 2020-02-16 MED ORDER — THROMBIN 5000 UNITS EX SOLR
CUTANEOUS | Status: AC
  Filled 2020-02-16: qty 5000

## 2020-02-16 MED ORDER — THROMBIN 20000 UNITS EX SOLR: CUTANEOUS | Status: DC | PRN

## 2020-02-16 MED ORDER — BUPIVACAINE HCL (PF) 0.5 % IJ SOLN
INTRAMUSCULAR | Status: DC | PRN
  Administered 2020-02-16: 30 mL

## 2020-02-16 MED ORDER — 0.9 % SODIUM CHLORIDE (POUR BTL) OPTIME
TOPICAL | Status: DC | PRN
  Administered 2020-02-16: 1000 mL

## 2020-02-16 MED ORDER — ROCURONIUM BROMIDE 10 MG/ML (PF) SYRINGE
PREFILLED_SYRINGE | INTRAVENOUS | Status: AC
  Filled 2020-02-16: qty 10

## 2020-02-16 MED ORDER — DEXAMETHASONE SODIUM PHOSPHATE 10 MG/ML IJ SOLN
INTRAMUSCULAR | Status: DC | PRN
  Administered 2020-02-16: 10 mg via INTRAVENOUS
  Administered 2020-02-16: 4 mg via INTRAVENOUS

## 2020-02-16 MED ORDER — THROMBIN 20000 UNITS EX SOLR
CUTANEOUS | Status: AC
  Filled 2020-02-16: qty 20000

## 2020-02-16 MED ORDER — FENTANYL CITRATE (PF) 250 MCG/5ML IJ SOLN
INTRAMUSCULAR | Status: AC
  Filled 2020-02-16: qty 5

## 2020-02-16 MED ORDER — CEFAZOLIN SODIUM 1 G IJ SOLR
INTRAMUSCULAR | Status: AC
  Filled 2020-02-16: qty 20

## 2020-02-16 MED ORDER — MIDAZOLAM HCL 2 MG/2ML IJ SOLN
INTRAMUSCULAR | Status: AC
  Filled 2020-02-16: qty 2

## 2020-02-16 MED ORDER — PHENYLEPHRINE 40 MCG/ML (10ML) SYRINGE FOR IV PUSH (FOR BLOOD PRESSURE SUPPORT)
PREFILLED_SYRINGE | INTRAVENOUS | Status: AC
  Filled 2020-02-16: qty 10

## 2020-02-16 MED ORDER — OXYCODONE HCL 5 MG/5ML PO SOLN: 5.0000 mg | Freq: Once | ORAL | Status: DC | PRN

## 2020-02-16 MED ORDER — ACETAMINOPHEN 10 MG/ML IV SOLN
INTRAVENOUS | Status: DC | PRN
  Administered 2020-02-16: 1000 mg via INTRAVENOUS

## 2020-02-16 MED ORDER — CEFAZOLIN SODIUM-DEXTROSE 2-4 GM/100ML-% IV SOLN
INTRAVENOUS | Status: AC
  Filled 2020-02-16: qty 100

## 2020-02-16 MED ORDER — PROPOFOL 10 MG/ML IV BOLUS
INTRAVENOUS | Status: DC | PRN
  Administered 2020-02-16: 160 mg via INTRAVENOUS

## 2020-02-16 MED ORDER — LIDOCAINE-EPINEPHRINE 0.5 %-1:200000 IJ SOLN
INTRAMUSCULAR | Status: AC
  Filled 2020-02-16: qty 1

## 2020-02-16 MED ORDER — FENTANYL CITRATE (PF) 100 MCG/2ML IJ SOLN
INTRAMUSCULAR | Status: DC | PRN
  Administered 2020-02-16 (×5): 50 ug via INTRAVENOUS

## 2020-02-16 MED ORDER — LACTATED RINGERS IV SOLN: INTRAVENOUS | Status: DC

## 2020-02-16 MED ORDER — ONDANSETRON HCL 4 MG/2ML IJ SOLN
INTRAMUSCULAR | Status: AC
  Filled 2020-02-16: qty 2

## 2020-02-16 MED ORDER — CEFAZOLIN SODIUM-DEXTROSE 2-4 GM/100ML-% IV SOLN
2.0000 g | INTRAVENOUS | Status: AC
  Administered 2020-02-16 (×2): 2 g via INTRAVENOUS

## 2020-02-16 MED ORDER — ROCURONIUM BROMIDE 10 MG/ML (PF) SYRINGE
PREFILLED_SYRINGE | INTRAVENOUS | Status: DC | PRN
  Administered 2020-02-16: 70 mg via INTRAVENOUS
  Administered 2020-02-16: 30 mg via INTRAVENOUS
  Administered 2020-02-16 (×3): 20 mg via INTRAVENOUS
  Administered 2020-02-16 (×2): 30 mg via INTRAVENOUS

## 2020-02-16 MED ORDER — HYDROMORPHONE HCL 1 MG/ML IJ SOLN: 0.2500 mg | INTRAMUSCULAR | Status: DC | PRN

## 2020-02-16 MED ORDER — BUPIVACAINE HCL (PF) 0.5 % IJ SOLN
INTRAMUSCULAR | Status: AC
  Filled 2020-02-16: qty 30

## 2020-02-16 MED ORDER — THROMBIN 5000 UNITS EX SOLR
OROMUCOSAL | Status: DC | PRN
  Administered 2020-02-16: 5 mL via TOPICAL

## 2020-02-16 MED ORDER — LIDOCAINE 2% (20 MG/ML) 5 ML SYRINGE
INTRAMUSCULAR | Status: DC | PRN
  Administered 2020-02-16: 100 mg via INTRAVENOUS

## 2020-02-16 MED ORDER — ONDANSETRON HCL 4 MG/2ML IJ SOLN
INTRAMUSCULAR | Status: DC | PRN
  Administered 2020-02-16 (×2): 4 mg via INTRAVENOUS

## 2020-02-16 MED ORDER — ACETAMINOPHEN 10 MG/ML IV SOLN
INTRAVENOUS | Status: AC
  Filled 2020-02-16: qty 100

## 2020-02-16 MED ORDER — PROPOFOL 10 MG/ML IV BOLUS
INTRAVENOUS | Status: AC
  Filled 2020-02-16: qty 20

## 2020-02-16 SURGICAL SUPPLY — 79 items
ADH SKN CLS APL DERMABOND .7 (GAUZE/BANDAGES/DRESSINGS) ×4
APL SKNCLS STERI-STRIP NONHPOA (GAUZE/BANDAGES/DRESSINGS)
BAND INSRT 18 STRL LF DISP RB (MISCELLANEOUS) ×4
BAND RUBBER #18 3X1/16 STRL (MISCELLANEOUS) ×2 IMPLANT
BENZOIN TINCTURE PRP APPL 2/3 (GAUZE/BANDAGES/DRESSINGS) IMPLANT
BIT DRILL PLIF MAS DISP 5.5MM (DRILL) IMPLANT
BLADE CLIPPER SURG (BLADE) IMPLANT
BLADE SURG 15 STRL LF DISP TIS (BLADE) IMPLANT
BLADE SURG 15 STRL SS (BLADE) ×3
BUR MATCHSTICK NEURO 3.0 LAGG (BURR) ×3 IMPLANT
BUR PRECISION FLUTE 5.0 (BURR) ×3 IMPLANT
CAGE POST IBF 9X4D 26/9 (Cage) ×2 IMPLANT
CANISTER SUCT 3000ML PPV (MISCELLANEOUS) ×3 IMPLANT
CAP RELINE MOD TULIP RMM (Cap) ×2 IMPLANT
CARTRIDGE OIL MAESTRO DRILL (MISCELLANEOUS) ×2 IMPLANT
CNTNR URN SCR LID CUP LEK RST (MISCELLANEOUS) ×2 IMPLANT
CONNECTOR RELINE ROTATE 5-6MM (Connector) ×1 IMPLANT
CONT SPEC 4OZ STRL OR WHT (MISCELLANEOUS) ×3
COVER BACK TABLE 60X90IN (DRAPES) ×3 IMPLANT
COVER WAND RF STERILE (DRAPES) ×3 IMPLANT
DECANTER SPIKE VIAL GLASS SM (MISCELLANEOUS) ×3 IMPLANT
DERMABOND ADVANCED (GAUZE/BANDAGES/DRESSINGS) ×2
DERMABOND ADVANCED .7 DNX12 (GAUZE/BANDAGES/DRESSINGS) ×2 IMPLANT
DIFFUSER DRILL AIR PNEUMATIC (MISCELLANEOUS) ×3 IMPLANT
DRAPE C-ARM 42X72 X-RAY (DRAPES) ×6 IMPLANT
DRAPE C-ARMOR (DRAPES) IMPLANT
DRAPE INCISE IOBAN 66X45 STRL (DRAPES) ×1 IMPLANT
DRAPE LAPAROTOMY 100X72X124 (DRAPES) ×3 IMPLANT
DRAPE MICROSCOPE LEICA (MISCELLANEOUS) ×1 IMPLANT
DRAPE SURG 17X23 STRL (DRAPES) ×3 IMPLANT
DRILL PLIF MAS DISP 5.5MM (DRILL) ×3
DRSG OPSITE POSTOP 4X6 (GAUZE/BANDAGES/DRESSINGS) ×1 IMPLANT
DRSG OPSITE POSTOP 4X8 (GAUZE/BANDAGES/DRESSINGS) ×1 IMPLANT
DURAPREP 26ML APPLICATOR (WOUND CARE) ×3 IMPLANT
ELECT BLADE 4.0 EZ CLEAN MEGAD (MISCELLANEOUS) ×3
ELECT REM PT RETURN 9FT ADLT (ELECTROSURGICAL) ×3
ELECTRODE BLDE 4.0 EZ CLN MEGD (MISCELLANEOUS) IMPLANT
ELECTRODE REM PT RTRN 9FT ADLT (ELECTROSURGICAL) ×2 IMPLANT
GAUZE 4X4 16PLY RFD (DISPOSABLE) IMPLANT
GAUZE SPONGE 4X4 12PLY STRL (GAUZE/BANDAGES/DRESSINGS) IMPLANT
GLOVE ECLIPSE 6.5 STRL STRAW (GLOVE) ×6 IMPLANT
GLOVE EXAM NITRILE XL STR (GLOVE) IMPLANT
GOWN STRL REUS W/ TWL LRG LVL3 (GOWN DISPOSABLE) ×4 IMPLANT
GOWN STRL REUS W/ TWL XL LVL3 (GOWN DISPOSABLE) IMPLANT
GOWN STRL REUS W/TWL 2XL LVL3 (GOWN DISPOSABLE) IMPLANT
GOWN STRL REUS W/TWL LRG LVL3 (GOWN DISPOSABLE) ×6
GOWN STRL REUS W/TWL XL LVL3 (GOWN DISPOSABLE)
KIT BASIN OR (CUSTOM PROCEDURE TRAY) ×3 IMPLANT
KIT POSITION SURG JACKSON T1 (MISCELLANEOUS) ×3 IMPLANT
KIT TURNOVER KIT B (KITS) ×3 IMPLANT
MILL MEDIUM DISP (BLADE) IMPLANT
NDL HYPO 25X1 1.5 SAFETY (NEEDLE) ×2 IMPLANT
NDL SPNL 18GX3.5 QUINCKE PK (NEEDLE) IMPLANT
NEEDLE HYPO 25X1 1.5 SAFETY (NEEDLE) ×3 IMPLANT
NEEDLE SPNL 18GX3.5 QUINCKE PK (NEEDLE) IMPLANT
NS IRRIG 1000ML POUR BTL (IV SOLUTION) ×3 IMPLANT
OIL CARTRIDGE MAESTRO DRILL (MISCELLANEOUS) ×3
PACK LAMINECTOMY NEURO (CUSTOM PROCEDURE TRAY) ×3 IMPLANT
PAD ARMBOARD 7.5X6 YLW CONV (MISCELLANEOUS) ×6 IMPLANT
ROD COCR STRT RELINE-O  5X200 (Rod) ×3 IMPLANT
ROD COCR STRT RELINE-O 5X200 (Rod) IMPLANT
ROD RELINE O-H CON M 5.0/6.0MM (Rod) ×1 IMPLANT
ROD RELINE-O COCR 5.0X55MM (Rod) ×1 IMPLANT
SCREW LOCK RELINE 5.5 TULIP (Screw) ×2 IMPLANT
SCREW LOCK RSS 4.5/5.0MM (Screw) ×2 IMPLANT
SCREW SHANK RELINE MOD 5.5X35 (Screw) ×1 IMPLANT
SHANK RELINE MOD 5.5X40 (Screw) ×1 IMPLANT
SPONGE LAP 4X18 RFD (DISPOSABLE) IMPLANT
SPONGE SURGIFOAM ABS GEL 100 (HEMOSTASIS) ×3 IMPLANT
STRIP CLOSURE SKIN 1/2X4 (GAUZE/BANDAGES/DRESSINGS) IMPLANT
SUT PROLENE 6 0 BV (SUTURE) IMPLANT
SUT VIC AB 0 CT1 18XCR BRD8 (SUTURE) ×2 IMPLANT
SUT VIC AB 0 CT1 8-18 (SUTURE) ×9
SUT VIC AB 2-0 CT1 18 (SUTURE) ×5 IMPLANT
SUT VIC AB 3-0 SH 8-18 (SUTURE) ×5 IMPLANT
TOWEL GREEN STERILE (TOWEL DISPOSABLE) ×3 IMPLANT
TOWEL GREEN STERILE FF (TOWEL DISPOSABLE) ×3 IMPLANT
TRAY FOLEY MTR SLVR 16FR STAT (SET/KITS/TRAYS/PACK) ×3 IMPLANT
WATER STERILE IRR 1000ML POUR (IV SOLUTION) ×3 IMPLANT

## 2020-02-16 NOTE — Op Note (Signed)
02/16/2020  11:39 PM  PATIENT:  [name]  59 y.o. female  PRE-OPERATIVE DIAGNOSIS:  Lumbar herniated disc L2/3, Thoracic herniated disc T10/11, Facet arthropathy  POST-OPERATIVE DIAGNOSIS:  saMe  PROCEDURE:  Procedure(s): Lumbar 2-3 Posterior lumbar interbody fusion, Titanium cages 9 x26 mm packed with autograft morsels, and in the disc space Laminectomy L2 in excess of the needed exposure for a plif.  Segmental pedicle screw fixation L2-4 Thoracic ten-eleven Discectomy, left  SURGEON:  Surgeon(s): Coletta Memos, MD  ASSISTANTS:none  ANESTHESIA:   general  EBL:  Total I/O In: 2020 [I.V.:1400; IV Piggyback:620] Out: 590 [Urine:90; Blood:500]  BLOOD ADMINISTERED:none  CELL SAVER GIVEN:none  COUNT:per nursing  DRAINS: none   SPECIMEN:  No Specimen  DICTATION: [name] is a 59 y.o. female whom was taken to the operating room intubated, and placed under a general anesthetic without difficulty. A foley catheter was placed under sterile conditions. She was positioned prone on a Jackson stable with all pressure points properly padded.  Her lumbar region was prepped and draped in a sterile manner. I infiltrated 0cc's 1/2%lidocaine/1:2000,000 strength epinephrine into the planned incision. I opened the skin with a 10 blade and took the incision down to the thoracolumbar fascia. I exposed the lamina of L2 in a subperiosteal fashion bilaterally. I confirmed my location with an intraoperative xray.  I placed self retaining retractors and started the decompression.  I decompressed the spinal canal via a hemilaminectomy of L2. I was using the drill and Kerrison punches to remove the bone and ligamentum flavum. I exposed the disc spaces and the foramina. I performed inferior L2 facetectomies bilaterally. I then removed the large disc free fragment on the left side. The decompression was far in excess of the needed exposure for a PLIF. PLIF's were performed at L2/3 in the same  fashion. I opened the disc space with a 15 blade then used a variety of instruments to remove the disc and prepare the space for the arthrodesis. I used curettes, rongeurs, punches, shavers for the disc space, and rasps in the discetomy. I measured the disc space and placed Titanium cages packed with autograft morsels  (Synthes) into the disc space(s).  I decorticated the lateral bone at L2,3. I then placed autograft morsels on the decorticated surfaces to complete the posterolateral arthrodesis.  I placed pedicle screws at L2, using fluoroscopic guidance. I drilled a pilot hole, then cannulated the pedicle with a  at each site. I then tapped each pedicle, assessing each site for pedicle violations. No cutouts were appreciated. Screws (Nuvasive relign) were then placed at each site without difficulty. I attached rods and locking caps with the appropriate tools. The locking caps were secured with torque limited screwdrivers. Final films were performed and the final construct appeared to be in good position.  I closed the wound in a layered fashion. I approximated the thoracolumbar fascia, subcutaneous, and subcuticular planes with vicryl sutures. I used dermabond and an occlusive bandage for a sterile dressing.   I then started the thoracic discetomy. I opened the skin with a 10 blade and exposed the T10, and T11 lamina on the left. I used the drill to perform a semihemilaminectomy on the left of T11,and 10. I identified the T10 pedicle and the T10 nerve root. I confirmed my location with fluoroscopy. I used the drill to remove more bone laterally overlying the disc space. I via a transpedicular approach exposed the disc. I opened the disc space with a 15 blade  and carefully performed a discetomy. There was a large central bulge, but I believe I was lateral enough to decompress the spinal canal, and to remove much of the disc. I was able to access the disc anterior to then spinal cord eliminating retraction on  the cord. Once the decompression was complete I irrigated then closed the wound. I approximated the thoracolumbar fascia, the subcutaneous and subcuticular planes with vicryl sutures. I applied dermabond and an occlusive bandage.  She was extubated after being moved to the or stretcher.   PLAN OF CARE: Admit to inpatient   PATIENT DISPOSITION:  PACU - hemodynamically stable.   Delay start of Pharmacological VTE agent (>24hrs) due to surgical blood loss or risk of bleeding:  yes

## 2020-02-16 NOTE — Transfer of Care (Signed)
Immediate Anesthesia Transfer of Care Note  Patient: [name]  Procedure(s) Performed: Lumbar 2-3 Posterior lumbar interbody fusion (N/A Back) Thoracic ten-eleven Discectomy (Back)  Patient Location: PACU  Anesthesia Type:General  Level of Consciousness: sedated, drowsy, patient cooperative and responds to stimulation  Airway & Oxygen Therapy: Patient Spontanous Breathing and Patient connected to nasal cannula oxygen  Post-op Assessment: Report given to RN, Post -op Vital signs reviewed and stable and Patient moving all extremities X 4  Post vital signs: Reviewed and stable  Last Vitals:  Vitals Value Taken Time  BP 120/51 02/16/20 2315  Temp 36.2 C 02/16/20 2311  Pulse 81 02/16/20 2320  Resp 26 02/16/20 2320  SpO2 100 % 02/16/20 2320  Vitals shown include unvalidated device data.  Last Pain:  Vitals:   02/16/20 1236  TempSrc: Oral  PainSc:          Complications: No apparent anesthesia complications

## 2020-02-16 NOTE — Anesthesia Procedure Notes (Signed)
Procedure Name: Intubation Date/Time: 02/16/2020 4:24 PM Performed by: Barrington Ellison, CRNA Pre-anesthesia Checklist: Patient identified, Emergency Drugs available, Suction available and Patient being monitored Patient Re-evaluated:Patient Re-evaluated prior to induction Oxygen Delivery Method: Circle System Utilized Preoxygenation: Pre-oxygenation with 100% oxygen Induction Type: IV induction Ventilation: Mask ventilation without difficulty Laryngoscope Size: Mac and 3 Grade View: Grade I Tube type: Oral Tube size: 7.0 mm Number of attempts: 1 Airway Equipment and Method: Stylet and Oral airway Placement Confirmation: ETT inserted through vocal cords under direct vision,  positive ETCO2 and breath sounds checked- equal and bilateral Secured at: 21 cm Tube secured with: Tape Dental Injury: Teeth and Oropharynx as per pre-operative assessment

## 2020-02-16 NOTE — Anesthesia Preprocedure Evaluation (Signed)
Anesthesia Evaluation  Patient identified by MRN, date of birth, ID band Patient awake    Reviewed: Allergy & Precautions, NPO status , Patient's Chart, lab work & pertinent test results, reviewed documented beta blocker date and time   History of Anesthesia Complications Negative for: history of anesthetic complications  Airway Mallampati: II  TM Distance: >3 FB Neck ROM: Full    Dental  (+) Edentulous Upper, Edentulous Lower   Pulmonary asthma , former smoker,    Pulmonary exam normal        Cardiovascular hypertension, Pt. on medications and Pt. on home beta blockers Normal cardiovascular exam     Neuro/Psych PSYCHIATRIC DISORDERS Anxiety Depression negative neurological ROS     GI/Hepatic Neg liver ROS, hiatal hernia, GERD  ,  Endo/Other  negative endocrine ROS  Renal/GU negative Renal ROS  negative genitourinary   Musculoskeletal  (+) Arthritis , Fibromyalgia -  Abdominal   Peds  Hematology negative hematology ROS (+)   Anesthesia Other Findings   Reproductive/Obstetrics                             Anesthesia Physical Anesthesia Plan  ASA: II  Anesthesia Plan: General   Post-op Pain Management:    Induction: Intravenous  PONV Risk Score and Plan: 3 and Ondansetron, Dexamethasone, Treatment may vary due to age or medical condition and Midazolam  Airway Management Planned: Oral ETT  Additional Equipment: None  Intra-op Plan:   Post-operative Plan: Extubation in OR  Informed Consent: I have reviewed the patients History and Physical, chart, labs and discussed the procedure including the risks, benefits and alternatives for the proposed anesthesia with the patient or authorized representative who has indicated his/her understanding and acceptance.     Dental advisory given  Plan Discussed with:   Anesthesia Plan Comments:         Anesthesia Quick Evaluation

## 2020-02-16 NOTE — Progress Notes (Signed)
Son notified of patient leaving floor for OR. [name]

## 2020-02-17 MED ORDER — SENNOSIDES-DOCUSATE SODIUM 8.6-50 MG PO TABS: 1.0000 | ORAL_TABLET | Freq: Every evening | ORAL | Status: DC | PRN

## 2020-02-17 MED ORDER — MORPHINE SULFATE (PF) 2 MG/ML IV SOLN: 2.0000 mg | INTRAVENOUS | Status: DC | PRN

## 2020-02-17 MED ORDER — MENTHOL 3 MG MT LOZG: 1.0000 | LOZENGE | OROMUCOSAL | Status: DC | PRN

## 2020-02-17 MED ORDER — BISACODYL 5 MG PO TBEC: 5.0000 mg | DELAYED_RELEASE_TABLET | Freq: Every day | ORAL | Status: DC | PRN

## 2020-02-17 MED ORDER — SODIUM CHLORIDE 0.9 % IV SOLN: 250.0000 mL | INTRAVENOUS | Status: DC

## 2020-02-17 MED ORDER — SODIUM CHLORIDE 0.9% FLUSH
3.0000 mL | Freq: Two times a day (BID) | INTRAVENOUS | Status: DC
  Administered 2020-02-17 – 2020-02-19 (×4): 3 mL via INTRAVENOUS

## 2020-02-17 MED ORDER — ACETAMINOPHEN 325 MG PO TABS: 650.0000 mg | ORAL_TABLET | ORAL | Status: DC | PRN

## 2020-02-17 MED ORDER — OXYCODONE HCL ER 15 MG PO T12A
15.0000 mg | EXTENDED_RELEASE_TABLET | Freq: Two times a day (BID) | ORAL | Status: DC
  Administered 2020-02-17 – 2020-02-22 (×11): 15 mg via ORAL
  Filled 2020-02-17 (×12): qty 1

## 2020-02-17 MED ORDER — DOCUSATE SODIUM 100 MG PO CAPS
100.0000 mg | ORAL_CAPSULE | Freq: Two times a day (BID) | ORAL | Status: DC
  Administered 2020-02-17 – 2020-02-22 (×11): 100 mg via ORAL
  Filled 2020-02-17 (×11): qty 1

## 2020-02-17 MED ORDER — ONDANSETRON HCL 4 MG PO TABS: 4.0000 mg | ORAL_TABLET | Freq: Four times a day (QID) | ORAL | Status: DC | PRN

## 2020-02-17 MED ORDER — SODIUM CHLORIDE 0.9% FLUSH: 3.0000 mL | INTRAVENOUS | Status: DC | PRN

## 2020-02-17 MED ORDER — DIAZEPAM 5 MG PO TABS
5.0000 mg | ORAL_TABLET | Freq: Four times a day (QID) | ORAL | Status: DC | PRN
  Administered 2020-02-17 – 2020-02-18 (×3): 5 mg via ORAL
  Filled 2020-02-17 (×3): qty 1

## 2020-02-17 MED ORDER — POTASSIUM CHLORIDE IN NACL 20-0.9 MEQ/L-% IV SOLN: INTRAVENOUS | Status: DC

## 2020-02-17 MED ORDER — ONDANSETRON HCL 4 MG/2ML IJ SOLN: 4.0000 mg | Freq: Four times a day (QID) | INTRAMUSCULAR | Status: DC | PRN

## 2020-02-17 MED ORDER — ACETAMINOPHEN 650 MG RE SUPP: 650.0000 mg | RECTAL | Status: DC | PRN

## 2020-02-17 MED ORDER — MAGNESIUM CITRATE PO SOLN: 1.0000 | Freq: Once | ORAL | Status: DC | PRN

## 2020-02-17 MED ORDER — PHENOL 1.4 % MT LIQD: 1.0000 | OROMUCOSAL | Status: DC | PRN

## 2020-02-17 NOTE — Anesthesia Postprocedure Evaluation (Signed)
Anesthesia Post Note  Patient: [name]  Procedure(s) Performed: Lumbar 2-3 Posterior lumbar interbody fusion (N/A Back) Thoracic ten-eleven Discectomy (Back)     Patient location during evaluation: PACU Anesthesia Type: General Level of consciousness: awake and alert Pain management: pain level controlled Vital Signs Assessment: post-procedure vital signs reviewed and stable Respiratory status: spontaneous breathing, nonlabored ventilation, respiratory function stable and patient connected to nasal cannula oxygen Cardiovascular status: blood pressure returned to baseline and stable Postop Assessment: no apparent nausea or vomiting Anesthetic complications: no Comments: Discovered on emergence that patient had contacts in her eyes.  Contacts were removed and placed in specimen cups prior to extubation.    Last Vitals:  Vitals:   02/17/20 0012 02/17/20 0335  BP: 140/69 (!) 137/59  Pulse: 76 78  Resp: 18 16  Temp: 36.7 C (!) 36.4 C  SpO2: 100% (!) 60%    Last Pain:  Vitals:   02/17/20 0335  TempSrc: Oral  PainSc:                  Cecile Hearing

## 2020-02-17 NOTE — Evaluation (Signed)
Occupational Therapy Evaluation Patient Details Name: [name] MRN: [id number] DOB: Dec 19, 1960 Today's Date: 02/17/2020    History of Present Illness PT is a 59 yo female admitted after a few weeks of falling frequently stating she is very weak in her R leg and can no longer move without falling. Pt found to have L2/3 disk herniation for which she underwent a PLIF surgery for on 5/12.  Pt also with T10-11 herniation.  Pt does not need brace.  PMH of B TKA, lumbar surgery in 10/20.  Chart states pt has essentially been "bed bound" since that surgery but other areas state she did walk some with assistive device and things got much worse the last two weeks.     Clinical Impression   Pt admitted for the above diagnosis and has the deficits listed below. Pt would benefit from cont OT to increase independence and tolerance for all adls and adl mobility so she can eventually live at home alone again.  Pt significant limited at this time due to pain and new onset confusion since surgery.  Pt unaware of where she was, her birthdate, or why she was in the hospital and unable to mobilize.  Once confusion clears, hoping pt can mobilize with less pain.  Feel with her history of being very immobile since surgery in Oct, SNF may be the best option for rehab. Will continue with focus on mobilization to EOB and onto her feet for adls.    Follow Up Recommendations  SNF;Supervision/Assistance - 24 hour    Equipment Recommendations  Other (comment)(unsure. Pt poor historian)    Recommendations for Other Services       Precautions / Restrictions Precautions Precautions: Back;Fall Restrictions Weight Bearing Restrictions: No      Mobility Bed Mobility Overal bed mobility: Needs Assistance Bed Mobility: Supine to Sit;Sit to Supine     Supine to sit: Max assist;HOB elevated Sit to supine: Max assist;HOB elevated   General bed mobility comments: Pt unable to move much on her own at all.  Pt  instructed to use bedrails and move own legs but pain is limiting participation.  Transfers                 General transfer comment: unable to stand during this session due to pain and confusion.    Balance Overall balance assessment: History of Falls;Needs assistance Sitting-balance support: Feet supported;Bilateral upper extremity supported Sitting balance-[name]: Poor Sitting balance - Comments: Pt required max assist to remain standing.       Standing balance comment: unable to stand                           ADL either performed or assessed with clinical judgement   ADL Overall ADL's : Needs assistance/impaired Eating/Feeding: Moderate assistance;Sitting;Cueing for sequencing Eating/Feeding Details (indicate cue type and reason): Pt not doing and care for herself. Pt is confused and in pain affecting ability to participate. Grooming: Wash/dry hands;Wash/dry face;Minimal assistance;Bed level;Cueing for compensatory techniques Grooming Details (indicate cue type and reason): Once hand over hand used to assist pt in getting started, pt would continue with task but eventually stop.  Eyes closed during most of session unless cued to open them. Upper Body Bathing: Moderate assistance;Sitting;Cueing for compensatory techniques Upper Body Bathing Details (indicate cue type and reason): Pt could not tolerate sitting without arms used to prop self up.  When fully in the bed, pt could bathe with constant  cues to stay on task. Lower Body Bathing: Total assistance;Bed level   Upper Body Dressing : Maximal assistance;Sitting;Cueing for compensatory techniques Upper Body Dressing Details (indicate cue type and reason): Pt cannot sit on EOB  long enough to donn shirt.  Assist needed in bed to get shirt over head. Lower Body Dressing: Total assistance;+2 for physical assistance     Toilet Transfer Details (indicate cue type and reason): unable Toileting- Clothing  Manipulation and Hygiene: Total assistance;+2 for physical assistance       Functional mobility during ADLs: Maximal assistance General ADL Comments: Pt very limited with any activity today due to 10/10 pain all over.  Pt unable to sit on EOB to attempt adls.  Adls were performed in bed and pt's confusion and pain limited what pt can do for herself.     Vision Baseline Vision/History: No visual deficits Patient Visual Report: No change from baseline Vision Assessment?: No apparent visual deficits Additional Comments: Pt states her vision is "normal." Pt states she has pain in her eyes and keeps eyes closed most of session.     Perception Perception Perception Tested?: No   Praxis Praxis Praxis tested?: Not tested    Pertinent Vitals/Pain Pain Assessment: 0-10 Pain Score: 10-Worst pain ever Pain Location: stomach, "eyeballs," and back with movement.  Pain Descriptors / Indicators: Aching;Grimacing;Guarding;Sore;Throbbing;Moaning Pain Intervention(s): Limited activity within patient's tolerance;Monitored during session;Premedicated before session;Repositioned     Hand Dominance Right   Extremity/Trunk Assessment Upper Extremity Assessment Upper Extremity Assessment: Difficult to assess due to impaired cognition   Lower Extremity Assessment Lower Extremity Assessment: Defer to PT evaluation   Cervical / Trunk Assessment Cervical / Trunk Assessment: Other exceptions(long time back issues)   Communication Communication Communication: No difficulties   Cognition Arousal/Alertness: Lethargic Behavior During Therapy: Anxious(self limiting but in pain) Overall Cognitive Status: Impaired/Different from baseline Area of Impairment: Orientation;Memory;Awareness;Problem solving;Safety/judgement                 Orientation Level: Disoriented to;Place;Time;Situation   Memory: Decreased recall of precautions   Safety/Judgement: Decreased awareness of safety;Decreased  awareness of deficits Awareness: Emergent Problem Solving: Slow processing;Decreased initiation;Requires verbal cues General Comments: cognition waxed and waned.   General Comments  pt very limited today due to confusion and pain.  Pt has had back problems for some time now and needs to mobilize but with the confusion today was unable.  Would think about rehab but feel SNF may be best option due to past back issues.    Exercises     Shoulder Instructions      Home Living Family/patient expects to be discharged to:: Private residence Living Arrangements: Alone Available Help at Discharge: Family;Available PRN/intermittently(unsure to what capacity family is available.) Type of Home: House Home Access: Stairs to enter [employer] of Steps: 5 Entrance Stairs-Rails: Can reach both Home Layout: One level     Bathroom Shower/Tub: Teacher, early years/pre: Handicapped height     Home Equipment: Cane - single point;Bedside commode;Shower seat   Additional Comments: has been living alone and falling frequently.      Prior Functioning/Environment Level of Independence: Independent with assistive device(s)                 OT Problem List: Decreased strength;Decreased range of motion;Decreased activity tolerance;Impaired balance (sitting and/or standing);Decreased cognition;Decreased safety awareness;Decreased knowledge of use of DME or AE;Decreased knowledge of precautions;Obesity;Impaired UE functional use;Pain      OT Treatment/Interventions: Self-care/ADL training;Therapeutic activities;DME and/or AE  instruction;Balance training    OT Goals(Current goals can be found in the care plan section) Acute Rehab OT Goals Patient Stated Goal: none states OT Goal Formulation: Patient unable to participate in goal setting Time For Goal Achievement: 03/02/20 Potential to Achieve Goals: Fair ADL Goals Pt Will Perform Eating: Independently;sitting Pt Will Perform  Grooming: with set-up;sitting Pt Will Perform Upper Body Bathing: with supervision;sitting Pt Will Transfer to Toilet: with mod assist;stand pivot transfer;bedside commode Additional ADL Goal #1: Pt will toletate sitting on EOB for 5 min with min assist in prep for further adls on EOB.  OT Frequency: Min 2X/week   Barriers to D/C: Decreased caregiver support  unsure of pt has 24/7 assist.       Co-evaluation              AM-PAC OT "6 Clicks" Daily Activity     Outcome Measure Help from another person eating meals?: A Lot Help from another person taking care of personal grooming?: A Lot Help from another person toileting, which includes using toliet, bedpan, or urinal?: Total Help from another person bathing (including washing, rinsing, drying)?: A Lot Help from another person to put on and taking off regular upper body clothing?: A Lot Help from another person to put on and taking off regular lower body clothing?: A Lot 6 Click Score: 11   End of Session Equipment Utilized During Treatment: Gait belt Nurse Communication: Mobility status  Activity Tolerance: Patient limited by pain Patient left: in bed;with call bell/phone within reach;with bed alarm set  OT Visit Diagnosis: Repeated falls (R29.6);Other symptoms and signs involving the nervous system (R29.898);Other symptoms and signs involving cognitive function                Time: 4142-3953 OT Time Calculation (min): 22 min Charges:  OT General Charges $OT Visit: 1 Visit OT Evaluation $OT Eval Moderate Complexity: 1 Mod  Hope Budds 02/17/2020, 10:16 AM

## 2020-02-17 NOTE — Progress Notes (Addendum)
Pt is now oriented to person, place, time and situation.  States "I'm just felt foggy this morning." Reports pain level is much improved.

## 2020-02-17 NOTE — Progress Notes (Signed)
Patient ID: [name], female   DOB: 1961-03-10, 59 y.o.   MRN: [id number] BP (!) 84/69 (BP Location: Right Arm)   Pulse 78   Temp (!) 97.3 F (36.3 C) (Oral)   Resp 18   LMP 02/18/2016   SpO2 100%  Alert and oriented Moving all extremities, right lower extremity still quite weak.  Refused PT today, also asked for a purewick.  I explained that she is expected to be up as much as possible. She will absolutely need to work with PT.  Wound is clean, dry, no signs of infection

## 2020-02-17 NOTE — Progress Notes (Signed)
Attempted to assist patient from bed to chair with walker and 2 assist.  Pt states too weak in right leg to stand.  Attempted x 2. PT is scheduled to evaluate patient today.

## 2020-02-17 NOTE — Progress Notes (Addendum)
Pt alert but oriented only to herself and birthday, but not birth year.  Believes she is in a "farmhouse", it is March, and she was born in [birth year].  She c/o tightness and spasms in her back and was medicated.  Eating breakfast at present.  Honeycomb dressings to back CDI (one small stained area on lumbar dressing).  Denies numbness and tingling.   Moves all extremities with much encouragement.  Reluctant to move.

## 2020-02-17 NOTE — Progress Notes (Signed)
PT Cancellation Note  Patient Details Name: [name] MRN: [id number] DOB: 15-Jan-1961   Cancelled Treatment:    Reason Eval/Treat Not Completed: Patient at procedure or test/unavailable  Initial attempt patient just getting pain medicine from RN. On return, pt with dinner. Discussed she sat at EOB twice today (OT and nursing) but was unable to stand. Evaluation deferred until 5/14 when 2nd person to assist available.   Jerolyn Center, PT Pager [phone]   Zena Amos 02/17/2020, 6:40 PM

## 2020-02-18 ENCOUNTER — Encounter: Payer: Self-pay | Admitting: *Deleted

## 2020-02-18 NOTE — Evaluation (Addendum)
Physical Therapy Evaluation Patient Details Name: [name] MRN: [id number] DOB: [birth year] Today's Date: 02/18/2020   History of Present Illness  Pt is a 59 yo female admitted after a few weeks of falling frequently stating she is very weak in her R leg and can no longer move without falling. Pt found to have L2/3 disk herniation for which she underwent a PLIF surgery for on 5/12.  Pt also with T10-11 herniation.  Pt does not need brace.  PMH of B TKA, lumbar surgery in 10/20.  Chart states pt has essentially been "bed bound" since that surgery but other areas state she did walk some with assistive device and things got much worse the last two weeks.    Clinical Impression  Pt was seen for mobility with nursing initiating but PT stepped in to assist.  Pt is in pain that limits her ability to control balance and endurance, and with positioning on her recliner was more comfortable.  Follow acutely to instruct her on mobility of gait, to increase standing stability and to increase her strength on LEs.    Follow Up Recommendations CIR    Equipment Recommendations  Rolling walker with 5" wheels    Recommendations for Other Services Rehab consult     Precautions / Restrictions Precautions Precautions: Back;Fall Precaution Booklet Issued: No Precaution Comments: reviewed precautions Restrictions Weight Bearing Restrictions: No Other Position/Activity Restrictions: NO brace needed      Mobility  Bed Mobility Overal bed mobility: Needs Assistance             General bed mobility comments: up with nurses when PT arrived  Transfers Overall transfer level: Needs assistance Equipment used: Rolling walker (2 wheeled);2 person hand held assist Transfers: Sit to/from [employer] Sit to Stand: Mod assist Stand pivot transfers: Mod assist       General transfer comment: .mod assist to compensate for her back pain and leg  weakness  Ambulation/Gait Ambulation/Gait assistance: Mod assist;+2 physical assistance;+2 safety/equipment Gait Distance (Feet): 25 Feet Assistive device: Rolling walker (2 wheeled);2 person hand held assist Gait Pattern/deviations: Step-through pattern;Wide base of support Gait velocity: reduced Gait velocity interpretation: <1.31 ft/sec, indicative of household ambulator General Gait Details: pain in back limits her tolerance and control of standing  Stairs            Wheelchair Mobility    Modified Rankin (Stroke Patients Only)       Balance Overall balance assessment: History of Falls;Needs assistance Sitting-balance support: Feet supported;Bilateral upper extremity supported Sitting balance-[name]: Fair Sitting balance - Comments: mod assist to stand up   Standing balance support: Bilateral upper extremity supported;During functional activity Standing balance-[name]: Poor                               Pertinent Vitals/Pain Pain Assessment: 0-10 Pain Score: 8  Pain Location: back Pain Descriptors / Indicators: Grimacing;Guarding    Home Living Family/patient expects to be discharged to:: Private residence Living Arrangements: Alone Available Help at Discharge: Family;Available PRN/intermittently Type of Home: House Home Access: Stairs to enter Entrance Stairs-Rails: Can reach both Entrance Stairs-Number of Steps: 5 Home Layout: One level Home Equipment: Cane - single point;Bedside commode;Shower seat Additional Comments: has a history of falls    Prior Function Level of Independence: Independent with assistive device(s)               Hand Dominance   Dominant Hand:  Right    Extremity/Trunk Assessment   Upper Extremity Assessment Upper Extremity Assessment: Generalized weakness    Lower Extremity Assessment Lower Extremity Assessment: Generalized weakness    Cervical / Trunk Assessment Cervical / Trunk Assessment:  Other exceptions(extended time with back pain) Cervical / Trunk Exceptions: lumbar fusion  Communication   Communication: No difficulties  Cognition Arousal/Alertness: Awake/alert Behavior During Therapy: Flat affect Overall Cognitive Status: Within Functional Limits for tasks assessed Area of Impairment: Problem solving;Safety/judgement                         Safety/Judgement: Decreased awareness of safety;Decreased awareness of deficits Awareness: Intellectual Problem Solving: Slow processing;Requires verbal cues General Comments: pt is not clear on her limitations, cannot determine safe restrictions and requires repeated back precautions      General Comments General comments (skin integrity, edema, etc.): tolerance for standing is a struggle as pt is in pain and weak in legs    Exercises     Assessment/Plan    PT Assessment Patient needs continued PT services  PT Problem List Decreased strength;Decreased range of motion;Decreased activity tolerance;Decreased balance;Decreased mobility;Decreased coordination;Decreased knowledge of use of DME;Decreased safety awareness;Obesity;Decreased skin integrity;Pain       PT Treatment Interventions DME instruction;Gait training;Stair training;Functional mobility training;Therapeutic activities;Therapeutic exercise;Balance training;Neuromuscular re-education;Patient/family education    PT Goals (Current goals can be found in the Care Plan section)  Acute Rehab PT Goals Patient Stated Goal: none states PT Goal Formulation: With patient Time For Goal Achievement: 03/03/20 Potential to Achieve Goals: Good    Frequency Min 3X/week   Barriers to discharge Inaccessible home environment;Decreased caregiver support home with limited help    Co-evaluation               AM-PAC PT "6 Clicks" Mobility  Outcome Measure Help needed turning from your back to your side while in a flat bed without using bedrails?: None Help  needed moving from lying on your back to sitting on the side of a flat bed without using bedrails?: A Little Help needed moving to and from a bed to a chair (including a wheelchair)?: Total Help needed standing up from a chair using your arms (e.g., wheelchair or bedside chair)?: Total Help needed to walk in hospital room?: A Lot Help needed climbing 3-5 steps with a railing? : Total 6 Click Score: 12    End of Session Equipment Utilized During Treatment: Gait belt Activity Tolerance: Patient limited by fatigue;Treatment limited secondary to medical complications (Comment);Patient limited by pain Patient left: in chair;with call bell/phone within reach;with chair alarm set Nurse Communication: Mobility status PT Visit Diagnosis: Unsteadiness on feet (R26.81);History of falling (Z91.81)    Time: 2979-8921 PT Time Calculation (min) (ACUTE ONLY): 15 min   Charges:   PT Evaluation $PT Eval Moderate Complexity: 1 Mod         Ramond Dial 02/18/2020, 11:14 PM  Mee Hives, PT MS Acute Rehab Dept. Number: [place]

## 2020-02-18 NOTE — Progress Notes (Signed)
Pt offered bedpan  In bed.Several attempts made by Nsg. staff to get pt oob to [facility] to void and umbulate tpt unable to do so.  Other attempts made but pt sat at bedside but unable to stand on her feet. /Pt stated cannot stand on rt leg and requested for Puwick . Catheter. Education given that pt as a surgical pt needs to mobilized therefore cannot have Puiwick.  The surgeon Dr Franky Macho made aware that pt unable to get oob and even sit on [facility]. PT will re evaluate pt  again today.  No new orders given by Md.

## 2020-02-18 NOTE — TOC Initial Note (Signed)
Transition of Care [facility]) - Initial/Assessment Note    Patient Details  Name: [name] MRN: [id number] Date of Birth: Feb 14, 1961  Transition of Care [facility]) CM/SW Contact:    Arvella Merles, [place] Phone Number: 02/18/2020, 3:48 PM  Clinical Narrative:  CSW received consult for possible SNF placement at time of discharge. CSW met with patient bedside and introduced self and role. Patient shared that PTA she was living alone. Patient expressed she would like to return home and has previously had [place]. CSW inquired on 24 hour care, patient stated her neighbors could assist. Patient provided verbal permission for her son Verna Czech to be contacted to discuss any needs.     CSW spoke with patient's son Verna Czech who lives in [place]. Son expressed patient's living situation is not ideal and compared the home to a hoarding situation. He stated EMS has challenges getting her out of the home with it's condition. He expressed he does not believe it would be safe for her to return prior to a short-term SNF stay and he will discuss the importance discharging to SNF with patient. TOC team will continue to follow.                Expected Discharge Plan: Skilled Nursing Facility Barriers to Discharge: Ship broker, Continued Medical Work up   Patient Goals and CMS Choice   CMS Medicare.gov Compare Post Acute Care list provided to:: Patient Choice offered to / list presented to : Patient  Expected Discharge Plan and Services Expected Discharge Plan: [place] arrangements for the past 2 months: Single Family Home                                      Prior Living Arrangements/Services Living arrangements for the past 2 months: Single Family Home Lives with:: Self Patient language and need for interpreter reviewed:: Yes Do you feel safe going back to the place where you live?: Yes      Need for Family Participation in  Patient Care: No (Comment) Care giver support system in place?: Yes (comment)   Criminal Activity/Legal Involvement Pertinent to Current Situation/Hospitalization: No - Comment as needed  Activities of Daily Living      Permission Sought/Granted Permission sought to share information with : Family Supports Permission granted to share information with : Yes, Verbal Permission Granted  Share Information with NAME: Verna Czech     Permission granted to share info w Relationship: Son  Permission granted to share info w Contact Information: [phone]  Emotional Assessment Appearance:: Appears stated age Attitude/Demeanor/Rapport: Unable to Assess Affect (typically observed): Unable to Assess Orientation: : Oriented to Self, Oriented to Place, Oriented to  Time, Oriented to Situation Alcohol / Substance Use: Not Applicable Psych Involvement: No (comment)  Admission diagnosis:  Hypokalemia [E87.6] Muscle weakness of lower extremity [M62.81] Lumbar disc herniation [M51.26] HNP (herniated nucleus pulposus), lumbar [M51.26] Patient Active Problem List   Diagnosis Date Noted  . HNP (herniated nucleus pulposus), lumbar 02/16/2020  . Muscle weakness of lower extremity 02/14/2020  . Spondylolisthesis of lumbar region 10/30/2017  . Painful orthopaedic hardware right ankle 05/27/2016  . Closed fracture of distal end of right fibula and tibia   . Ankle syndesmosis disruption   . ASCUS favor benign 01/06/2014  . Leg swelling 12/22/2013  . Seasonal allergies   . Anemia   .  Chronic back pain due to DJD with history surgery   . Hyperthyroidism   . 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 Status post left total prosthetic replacement of knee joint using cement 03/24/2012  . Fibromyalgia   . GERD (gastroesophageal reflux disease)   . Anxiety   . Depression   . Hypertension   . Lumbar degenerative disc disease 09/25/2011   PCP:  Alroy Dust, L.Marlou Sa,  MD Pharmacy:   [facility] [address] [place] [address]  [place] [address] Phone: [phone] Fax: [place], [place] [address] Phone: [phone] Fax: [phone]     Social Determinants of Health (SDOH) Interventions    Readmission Risk Interventions No flowsheet data found.

## 2020-02-18 NOTE — Progress Notes (Signed)
Patient ID: [name], female   DOB: 11/07/1960, 59 y.o.   MRN: [id number] BP (!) 114/58 (BP Location: Right Arm)   Pulse 75   Temp 98.2 F (36.8 C) (Oral)   Resp 20   LMP 02/18/2016   SpO2 99%  Alert and oriented x 4, speech is clear and fluent Still reluctant to work with pt Wounds areclean, dry Moving all extremities

## 2020-02-18 NOTE — Progress Notes (Signed)
Pt has shown much improvement today.  She has sat in chair for meals, ambulated to bathroom twice, and ambulated in hall approximately 60 feet, with a walker, 2 person assist, and chair behind patient.   Per PT and patient, her right leg will "give out" suddenly.  Pain improved.  Alert and oriented x 4.

## 2020-02-19 MED ORDER — WHITE PETROLATUM EX OINT
TOPICAL_OINTMENT | CUTANEOUS | Status: AC
  Filled 2020-02-19: qty 28.35

## 2020-02-19 NOTE — Progress Notes (Signed)
Physical Therapy Treatment Patient Details Name: [name] MRN: [id number] DOB: 07/30/1961 Today's Date: 02/19/2020    History of Present Illness Pt is a 59 yo female admitted after a few weeks of falling frequently stating she is very weak in her R leg and can no longer move without falling. Pt found to have L2/3 disk herniation for which she underwent a PLIF surgery for on 5/12.  Pt also with T10-11 herniation.  Pt does not need brace.  PMH of B TKA, lumbar surgery in 10/20.  Chart states pt has essentially been "bed bound" since that surgery but other areas state she did walk some with assistive device and things got much worse the last two weeks.      PT Comments    Pt supine in bed agreeable to mobilize.  She required HOB elevated and height of bed elevated ( very high ) to achieve standing.  She would likely require increased assistance from regular seated height.  Pt continues to progress slowly but able to ambulate with decreased assistance.  Continue to recommend aggressive rehab in a post acute setting to improve strength and function before returning home.    Follow Up Recommendations  CIR     Equipment Recommendations       Recommendations for Other Services Rehab consult     Precautions / Restrictions Precautions Precautions: Back;Fall Precaution Booklet Issued: No Precaution Comments: reviewed precautions Restrictions Weight Bearing Restrictions: No Other Position/Activity Restrictions: NO brace needed    Mobility  Bed Mobility Overal bed mobility: Needs Assistance Bed Mobility: Rolling;Sidelying to Sit Rolling: Min assist Sidelying to sit: Min assist       General bed mobility comments: Pt required min assistance to advance RLE and elevate trunk into a seated position.  Transfers Overall transfer level: Needs assistance Equipment used: Rolling walker (2 wheeled) Transfers: Sit to/from Stand Sit to Stand: Min assist;From elevated surface          General transfer comment: Increased height of bed.  Cues for hand placement to and from seated surface.  Ambulation/Gait Ambulation/Gait assistance: Min guard Gait Distance (Feet): 180 Feet Assistive device: Rolling walker (2 wheeled) Gait Pattern/deviations: Step-through pattern;Trunk flexed;Wide base of support;Decreased stride length;Decreased dorsiflexion - right;Decreased dorsiflexion - left Gait velocity: reduced   General Gait Details: Pt able to progres gt distance with cues for scapular retraction and upper trunk control.  Standing rest break x 2 due to pain in hands and knees.   Stairs             Wheelchair Mobility    Modified Rankin (Stroke Patients Only)       Balance Overall balance assessment: History of Falls;Needs assistance Sitting-balance support: Feet supported;Bilateral upper extremity supported Sitting balance-[name]: Fair       Standing balance-[name]: Fair                              Cognition Arousal/Alertness: Awake/alert Behavior During Therapy: Flat affect Overall Cognitive Status: Within Functional Limits for tasks assessed                                        Exercises      General Comments        Pertinent Vitals/Pain Pain Assessment: 0-10 Pain Score: 10-Worst pain ever Pain Location: back, hands, knees Pain Descriptors /  Indicators: Grimacing;Guarding Pain Intervention(s): Repositioned    Home Living                      Prior Function            PT Goals (current goals can now be found in the care plan section) Acute Rehab PT Goals Patient Stated Goal: To use the bathroom. Potential to Achieve Goals: Good Progress towards PT goals: Progressing toward goals    Frequency    Min 3X/week      PT Plan Current plan remains appropriate    Co-evaluation              AM-PAC PT "6 Clicks" Mobility   Outcome Measure  Help needed turning from your back  to your side while in a flat bed without using bedrails?: None Help needed moving from lying on your back to sitting on the side of a flat bed without using bedrails?: A Little Help needed moving to and from a bed to a chair (including a wheelchair)?: A Little Help needed standing up from a chair using your arms (e.g., wheelchair or bedside chair)?: A Little Help needed to walk in hospital room?: A Little Help needed climbing 3-5 steps with a railing? : A Little 6 Click Score: 19    End of Session Equipment Utilized During Treatment: Gait belt Activity Tolerance: Patient tolerated treatment well Patient left: (seated in bathroom with instruction to pull cord when finished.) Nurse Communication: Mobility status(pt location on commode) PT Visit Diagnosis: Unsteadiness on feet (R26.81);History of falling (Z91.81)     Time: 0981-1914 PT Time Calculation (min) (ACUTE ONLY): 17 min  Charges:  $Gait Training: 8-22 mins                     Erasmo Leventhal , PTA Acute Rehabilitation Services Pager [phone] Office [place] 02/19/2020, 4:43 PM

## 2020-02-19 NOTE — Progress Notes (Signed)
Pt walked approx 200 feet, tolerated well, dressing intact, clean and dry.

## 2020-02-19 NOTE — Progress Notes (Signed)
Patient ID: [name], female   DOB: May 20, 1961, 59 y.o.   MRN: [id number] Seems to be improving.  Feels like her right leg is stronger.  Walked 200 feet with nursing today.  Pain well controlled.  Continue current management

## 2020-02-20 ENCOUNTER — Other Ambulatory Visit: Payer: Self-pay

## 2020-02-20 NOTE — Progress Notes (Signed)
Physical Therapy Treatment Patient Details Name: [name] MRN: [id number] DOB: Jan 15, 1961 Today's Date: 02/20/2020    History of Present Illness Pt is a 59 yo female admitted after a few weeks of falling frequently stating she is very weak in her R leg and can no longer move without falling. Pt found to have L2/3 disk herniation for which she underwent a PLIF surgery for on 5/12.  Pt also with T10-11 herniation.  Pt does not need brace.  PMH of B TKA, lumbar surgery in 10/20.  Chart states pt has essentially been "bed bound" since that surgery but other areas state she did walk some with assistive device and things got much worse the last two weeks.      PT Comments    Patient continues to be unable to stand from a low surface if it does not have armrests. Practiced how she gets into/out of her tall bed with pt able to demonstrate technique that maintains her back precautions. Agreed she will be safe using her taller bed at home. She has progressed with gait safety (only cued 3x for RW proximity and upright posture over the course of 240 ft of walking). Patient hopeful she will go home tomorrow.     Follow Up Recommendations  Home health PT;Supervision for mobility/OOB     Equipment Recommendations  None recommended by PT(pt now states she has 2 wheel walker)    Recommendations for Other Services       Precautions / Restrictions Precautions Precautions: Back;Fall Precaution Booklet Issued: No Precaution Comments: pt able to state precautions; required vc to avoid/stop twisting x 4 during session Required Braces or Orthoses: (NO brace needed)    Mobility  Bed Mobility Overal bed mobility: Needs Assistance Bed Mobility: Rolling;Sidelying to Sit;Sit to Sidelying Rolling: Min guard(no rail; vc to bend knees to assist with hip/shoulder aligne) Sidelying to sit: Min guard(HOB flat, no rail)       General bed mobility comments: Discussed bed she will be sleeping in at home.  She prefers her newer, taller memory foam mattress. She demonstrated her technique for getting into tall bed with safe technique that does not cause her to bend or twist. (She stnds beside bed, facing the wall that her headboard is against. She performs a golfer's bend forward with LUE supported on her bedside table while raising her RLE to rest on the mattress as she bends forward at her left hip, keeping her back straight. Her right arm reaches to hold onto post of head board. She then log rolls onto her back as she brings her left legup onto bed (with bent knee).   Transfers Overall transfer level: Needs assistance Equipment used: Rolling walker (2 wheeled) Transfers: Sit to/from Stand Sit to Stand: From elevated surface;Min guard(to simulate her bed; unable with assist from lower style bed)         General transfer comment: Attempted to stand from regular height bed thinking she may need to sleep in her lower bed at home. Attempted with various foot and hand positions and pt unable to clear her buttocks off bed at all. Later pt able to demonstrate safe technique for getting into taller bed and agree pt will do better in taller bed. Pt used BSC over toilet for transfer  Ambulation/Gait Ambulation/Gait assistance: Min guard;Supervision Gait Distance (Feet): 240 Feet Assistive device: Rolling walker (2 wheeled) Gait Pattern/deviations: Step-through pattern;Trunk flexed;Wide base of support;Decreased stride length;Decreased dorsiflexion - right;Decreased dorsiflexion - left Gait velocity: reduced Gait  velocity interpretation: 1.31 - 2.62 ft/sec, indicative of limited community ambulator General Gait Details: Pt able to progres gt distance with cues for scapular retraction and upper trunk control.     Stairs             Wheelchair Mobility    Modified Rankin (Stroke Patients Only)       Balance Overall balance assessment: History of Falls;Needs assistance Sitting-balance  support: Feet supported;Bilateral upper extremity supported Sitting balance-[name]: Fair     Standing balance support: Bilateral upper extremity supported;During functional activity Standing balance-[name]: Fair                              Cognition Arousal/Alertness: Awake/alert Behavior During Therapy: WFL for tasks assessed/performed Overall Cognitive Status: Within Functional Limits for tasks assessed                                        Exercises      General Comments General comments (skin integrity, edema, etc.): Pt hoping to go home tomorrow      Pertinent Vitals/Pain Pain Assessment: Faces Faces Pain Scale: Hurts a little bit Pain Location: back Pain Descriptors / Indicators: Guarding Pain Intervention(s): Limited activity within patient's tolerance;Monitored during session;Repositioned    Home Living                      Prior Function            PT Goals (current goals can now be found in the care plan section) Acute Rehab PT Goals Patient Stated Goal: To use the bathroom. PT Goal Formulation: With patient Time For Goal Achievement: 03/03/20 Potential to Achieve Goals: Good Progress towards PT goals: Progressing toward goals    Frequency    Min 5X/week      PT Plan Discharge plan needs to be updated;Frequency needs to be updated;Equipment recommendations need to be updated    Co-evaluation              AM-PAC PT "6 Clicks" Mobility   Outcome Measure  Help needed turning from your back to your side while in a flat bed without using bedrails?: None Help needed moving from lying on your back to sitting on the side of a flat bed without using bedrails?: A Little Help needed moving to and from a bed to a chair (including a wheelchair)?: A Little Help needed standing up from a chair using your arms (e.g., wheelchair or bedside chair)?: A Little Help needed to walk in hospital room?: A  Little Help needed climbing 3-5 steps with a railing? : A Little 6 Click Score: 19    End of Session Equipment Utilized During Treatment: (pt refused gait belt) Activity Tolerance: Patient tolerated treatment well Patient left: in bed;with call bell/phone within reach;with bed alarm set Nurse Communication: Mobility status PT Visit Diagnosis: Unsteadiness on feet (R26.81);History of falling (Z91.81);Pain Pain - Right/Left: (center) Pain - part of body: (back)     Time: 1610-9604 PT Time Calculation (min) (ACUTE ONLY): 54 min  Charges:  $Gait Training: 23-37 mins $Therapeutic Activity: 23-37 mins                      Jerolyn Center, PT Pager [phone]    Zena Amos 02/20/2020, 1:29 PM

## 2020-02-20 NOTE — Progress Notes (Addendum)
Patient ID: [name], female   DOB: May 02, 1961, 59 y.o.   MRN: [id number] She is doing well today.  Her pain seems well controlled.  She walked fairly well with physical therapy yesterday.  The right leg strength is improving.  Her dressings are dry.  She seems to have good strength in her legs to and in bed examination.  I suspect she will be ready to go home tomorrow.  She seems to have DME at home.  Addendum: Physical therapy recommended CIR consult yesterday so consult entered

## 2020-02-20 NOTE — Progress Notes (Signed)
Patient lumbar dressing stained with bloody drainage; leaked onto the linen; no active bleeding; dressing removed and replaced with sterile dressing; skin cleansed prior to replacing bandage.  Continue to monitor.

## 2020-02-21 NOTE — Discharge Instructions (Signed)
Spinal Fusion °Care After °Refer to this sheet in the next few weeks. These instructions provide you with information on caring for yourself after your procedure. Your caregiver may also give you more specific instructions. Your treatment has been planned according to current medical practices, but problems sometimes occur. Call your caregiver if you have any problems or questions after your procedure. °HOME CARE INSTRUCTIONS  °· Take whatever pain medicine has been prescribed by your caregiver. Do not take over-the-counter pain medicine unless directed otherwise by your caregiver.  °· Do not drive if you are taking narcotic pain medicines.  °· Change your bandage (dressing) if necessary or as directed by your caregiver.  °· You may shower. The wound may get wet, simply pat the area dry. It will take ~2 weeks for the glue to peel off. °· If you have been prescribed medicine to prevent your blood from clotting, follow the directions carefully.  °· Check the area around your incision often. Look for redness and swelling. Also, look for anything leaking from your wound. You can use a mirror or have a family member inspect your incision if it is in a place where it is difficult for you to see.  °· Ask your caregiver what activities you should avoid and for how long.  °· Walk as much as possible.  °· Do not lift anything heavier than 5 lbs until your caregiver says it is safe.  °· Do not twist or bend for a few weeks. Try not to pull on things. Avoid sitting for long periods of time. Change positions at least every hour.  °

## 2020-02-21 NOTE — Progress Notes (Signed)
Physical Therapy Treatment Patient Details Name: [name] MRN: [id number] DOB: 01-11-1961 Today's Date: 02/21/2020    History of Present Illness Pt is a 59 yo female admitted after a few weeks of falling frequently stating she is very weak in her R leg and can no longer move without falling. Pt found to have L2/3 disk herniation for which she underwent a PLIF surgery for on 5/12.  Pt also with T10-11 herniation.  Pt does not need brace.  PMH of B TKA, lumbar surgery in 10/20.  Chart states pt has essentially been "bed bound" since that surgery but other areas state she did walk some with assistive device and things got much worse the last two weeks.      PT Comments    Patient remains hopeful she will go home today. She is now transferring and walking with RW up to 240Ft with supervision. Only required vc to prevent twisting x 1.    Follow Up Recommendations  Home health PT;Supervision for mobility/OOB     Equipment Recommendations  None recommended by PT(pt now states she has 2 wheel walker)    Recommendations for Other Services       Precautions / Restrictions Precautions Precautions: Back;Fall Precaution Booklet Issued: No Precaution Comments: pt able to state precautions; required vc to avoid/stop twisting x 1 during session Required Braces or Orthoses: (NO brace needed) Restrictions Weight Bearing Restrictions: No    Mobility  Bed Mobility                  Transfers Overall transfer level: Needs assistance Equipment used: Rolling walker (2 wheeled) Transfers: Sit to/from Stand Sit to Stand: From elevated surface;Supervision(to simulate her bed; elevated BSC)         General transfer comment: pt recalled correct sequencing  Ambulation/Gait Ambulation/Gait assistance: Supervision Gait Distance (Feet): 240 Feet Assistive device: Rolling walker (2 wheeled) Gait Pattern/deviations: Step-through pattern;Trunk flexed;Wide base of support;Decreased stride  length;Decreased dorsiflexion - right;Decreased dorsiflexion - left Gait velocity: reduced   General Gait Details: fewer cues needed for upright posture   Stairs             Wheelchair Mobility    Modified Rankin (Stroke Patients Only)       Balance Overall balance assessment: History of Falls;Needs assistance Sitting-balance support: Feet supported;Bilateral upper extremity supported Sitting balance-[name]: Fair     Standing balance support: During functional activity;Single extremity supported Standing balance-[name]: Fair Standing balance comment: static no UE support (at sink); able to use single UE support for pericare                            Cognition Arousal/Alertness: Awake/alert Behavior During Therapy: WFL for tasks assessed/performed Overall Cognitive Status: Within Functional Limits for tasks assessed                                        Exercises      General Comments        Pertinent Vitals/Pain Pain Assessment: 0-10 Pain Score: 7  Pain Location: back Pain Descriptors / Indicators: Guarding Pain Intervention(s): Limited activity within patient's tolerance;Monitored during session    Home Living                      Prior Function  PT Goals (current goals can now be found in the care plan section) Acute Rehab PT Goals Patient Stated Goal: To use the bathroom. PT Goal Formulation: With patient Time For Goal Achievement: 03/03/20 Potential to Achieve Goals: Good Progress towards PT goals: Progressing toward goals    Frequency    Min 5X/week      PT Plan Current plan remains appropriate    Co-evaluation              AM-PAC PT "6 Clicks" Mobility   Outcome Measure  Help needed turning from your back to your side while in a flat bed without using bedrails?: None Help needed moving from lying on your back to sitting on the side of a flat bed without using  bedrails?: A Little Help needed moving to and from a bed to a chair (including a wheelchair)?: A Little Help needed standing up from a chair using your arms (e.g., wheelchair or bedside chair)?: None Help needed to walk in hospital room?: None Help needed climbing 3-5 steps with a railing? : A Little 6 Click Score: 21    End of Session   Activity Tolerance: Patient tolerated treatment well Patient left: with call bell/phone within reach;in chair   PT Visit Diagnosis: Unsteadiness on feet (R26.81);History of falling (Z91.81);Pain Pain - Right/Left: (center) Pain - part of body: (back)     Time: 3295-1884 PT Time Calculation (min) (ACUTE ONLY): 36 min  Charges:  $Gait Training: 23-37 mins                      Arby Barrette, PT Pager [phone]    Rexanne Mano 02/21/2020, 3:08 PM

## 2020-02-21 NOTE — TOC Transition Note (Signed)
Transition of Care [facility]) - CM/SW Discharge Note   Patient Details  Name: [name] MRN: [id number] Date of Birth: [birth year]  [facility] of Care [facility]) CM/SW Contact:  Kermit Balo, RN Phone Number: 02/21/2020, 1:22 PM   Clinical Narrative:    Pt doing better and ambulated 240 feet yesterday. She is choosing to d/c home with [facility] services. CM provided choice and [facility] decided on. CAssie with [facility] accepted the referral.  Pt states she has all needed DME at home.  Pt has transportation home and someone to check in on her. MD please place orders for [facility] services.     Final next level of care: Home w Home Health Services Barriers to Discharge: No Barriers Identified   Patient Goals and CMS Choice   CMS Medicare.gov Compare Post Acute Care list provided to:: Patient Choice offered to / list presented to : Patient  Discharge Placement                       Discharge Plan and Services                          HH Arranged: PT, OT [facility] Agency: [facility] Date [facility] Agency Contacted: 02/21/20   Representative spoke with at [facility] Agency: Cassie  Social Determinants of Health (SDOH) Interventions     Readmission Risk Interventions No flowsheet data found.

## 2020-02-22 MED ORDER — LIDOCAINE HCL (PF) 1 % IJ SOLN
INTRAMUSCULAR | Status: AC
  Filled 2020-02-22: qty 5

## 2020-02-22 NOTE — Care Management Important Message (Signed)
Important Message  Patient Details  Name: [name] MRN: [id number] Date of Birth: [birth year]   Medicare Important Message Given:  Yes     [name] Stefan Church 02/22/2020, 2:33 PM

## 2020-02-22 NOTE — Progress Notes (Signed)
Occupational Therapy Treatment Patient Details Name: [name] MRN: [id number] DOB: Nov 18, 1960 Today's Date: 02/22/2020    History of present illness Pt is a 59 yo female admitted after a few weeks of falling frequently stating she is very weak in her R leg and can no longer move without falling. Pt found to have L2/3 disk herniation for which she underwent a PLIF surgery for on 5/12.  Pt also with T10-11 herniation.  Pt does not need brace.  PMH of B TKA, lumbar surgery in 10/20.  Chart states pt has essentially been "bed bound" since that surgery but other areas state she did walk some with assistive device and things got much worse the last two weeks.     OT comments  Pt overall supervision level for transfers and mobility with RW, cues for back precautions x2 with functional ADL - re-enforced compensatory strategies. Pt assisted for dressing in preparation for dc. RN in room at the end of session changing out bandage over incision. DC updated to [place].    Follow Up Recommendations  Home health OT    Equipment Recommendations    Pt has appropriate DME   Recommendations for Other Services  PT    Precautions / Restrictions Precautions Precautions: Back;Fall Precaution Booklet Issued: No Precaution Comments: pt able to state precautions; required vc to avoid/stop twisting x 1 during session Required Braces or Orthoses: (NO brace needed)       Mobility Bed Mobility Overal bed mobility: Needs Assistance Bed Mobility: Rolling;Sidelying to Sit Rolling: Min guard Sidelying to sit: Min guard          Transfers Overall transfer level: Needs assistance Equipment used: Rolling walker (2 wheeled) Transfers: Sit to/from Stand Sit to Stand: From elevated surface;Supervision(bed; elevated BSC)         General transfer comment: pt recalled correct sequencing    Balance Overall balance assessment: History of Falls;Needs assistance Sitting-balance support: Feet  supported;Bilateral upper extremity supported Sitting balance-[name]: Fair     Standing balance support: During functional activity;Single extremity supported Standing balance-[name]: Fair Standing balance comment: static no UE support (at sink); able to use single UE support for pericare                           ADL either performed or assessed with clinical judgement   ADL Overall ADL's : Needs assistance/impaired     Grooming: Wash/dry hands;Min guard;Cueing for safety;Standing Grooming Details (indicate cue type and reason): sink level         Upper Body Dressing : Set up;Sitting   Lower Body Dressing: Moderate assistance;Sit to/from stand   Toilet Transfer: Supervision/safety;Ambulation;RW   Toileting- Water quality scientist and Hygiene: Min guard;Sit to/from stand       Functional mobility during ADLs: Supervision/safety;Rolling walker       Vision       Perception     Praxis      Cognition Arousal/Alertness: Awake/alert Behavior During Therapy: WFL for tasks assessed/performed Overall Cognitive Status: Within Functional Limits for tasks assessed                                          Exercises     Shoulder Instructions       General Comments      Pertinent Vitals/ Pain       Pain  Location: back Pain Descriptors / Indicators: Guarding  Home Living                                          Prior Functioning/Environment              Frequency  Min 2X/week        Progress Toward Goals  OT Goals(current goals can now be found in the care plan section)  Progress towards OT goals: Progressing toward goals  Acute Rehab OT Goals Patient Stated Goal: To get dressed and go home OT Goal Formulation: With patient Time For Goal Achievement: 03/02/20 Potential to Achieve Goals: Good  Plan Discharge plan needs to be updated    Co-evaluation                 AM-PAC OT "6  Clicks" Daily Activity     Outcome Measure   Help from another person eating meals?: None Help from another person taking care of personal grooming?: A Little Help from another person toileting, which includes using toliet, bedpan, or urinal?: A Little Help from another person bathing (including washing, rinsing, drying)?: A Little Help from another person to put on and taking off regular upper body clothing?: A Little Help from another person to put on and taking off regular lower body clothing?: A Lot 6 Click Score: 18    End of Session Equipment Utilized During Treatment: Rolling walker  OT Visit Diagnosis: Repeated falls (R29.6);Other symptoms and signs involving the nervous system (R29.898);Other symptoms and signs involving cognitive function   Activity Tolerance Patient tolerated treatment well   Patient Left in chair;with call bell/phone within reach;with nursing/sitter in room   Nurse Communication Mobility status        Time: 7322-0254 OT Time Calculation (min): 26 min  Charges: OT General Charges $OT Visit: 1 Visit OT Treatments $Self Care/Home Management : 23-37 mins  Nyoka Cowden OTR/L Acute Rehabilitation Services Pager: [phone] Office: [phone]   [name] 02/22/2020, 1:57 PM

## 2020-02-22 NOTE — Progress Notes (Signed)
Pt getting dressed for discharge.  Dressing to lower back noted to be saturated and on the bed pad.  [place] dressing changed and Dr. Franky Macho called to notify. Awaiting instructions.

## 2020-02-22 NOTE — Plan of Care (Signed)
Patient stable, discussed POC with patient, agreeable with plan, denies question/concerns at this time.  

## 2020-02-22 NOTE — Progress Notes (Signed)
Pt being discharged at this time after incision sutured by Dr. Franky Macho.  She has all discharge instructions with her and verbalizes understanding, including returning to his office in two weeks for follow up. She has all belongings with her including her cell phone and charger, and dentures.  She is being picked up by her niece for transport home.

## 2020-02-22 NOTE — TOC Transition Note (Signed)
Transition of Care [facility]) - CM/SW Discharge Note   Patient Details  Name: [name] MRN: [id number] Date of Birth: 12-14-1960  Transition of Care [facility]) CM/SW Contact:  Lawerance Sabal, RN Phone Number: 02/22/2020, 10:11 AM   Clinical Narrative:   Notified [facility] of DC today. Orders for HH complete. No other CM needs identified.     Final next level of care: Home w Home Health Services Barriers to Discharge: No Barriers Identified   Patient Goals and CMS Choice   CMS Medicare.gov Compare Post Acute Care list provided to:: Patient Choice offered to / list presented to : Patient  Discharge Placement                       Discharge Plan and Services                          HH Arranged: PT, OT [facility] Agency: [facility] Date [facility] Agency Contacted: 02/21/20   Representative spoke with at [facility] Agency: Cassie  Social Determinants of Health (SDOH) Interventions     Readmission Risk Interventions No flowsheet data found.

## 2020-02-22 NOTE — Progress Notes (Signed)
Per Lurena Joiner at neurosurgery office, Dr. Franky Macho will suture patient's incision before discharge. Pt aware and no issues.

## 2020-02-26 ENCOUNTER — Other Ambulatory Visit: Payer: Self-pay

## 2020-02-26 ENCOUNTER — Encounter (HOSPITAL_COMMUNITY): Payer: Self-pay | Admitting: Emergency Medicine

## 2020-02-26 ENCOUNTER — Inpatient Hospital Stay (HOSPITAL_COMMUNITY)
Admission: EM | Admit: 2020-02-26 | Discharge: 2020-03-07 | DRG: 863 | Disposition: A | Discharge status: Skilled Nursing Facility | Payer: Medicare Other | Attending: Neurosurgery | Admitting: Neurosurgery

## 2020-02-26 DIAGNOSIS — B9561 Methicillin susceptible Staphylococcus aureus infection as the cause of diseases classified elsewhere: Secondary | ICD-10-CM | POA: Diagnosis present

## 2020-02-26 DIAGNOSIS — Z91013 Allergy to seafood: Secondary | ICD-10-CM | POA: Diagnosis not present

## 2020-02-26 DIAGNOSIS — Z20822 Contact with and (suspected) exposure to covid-19: Secondary | ICD-10-CM | POA: Diagnosis present

## 2020-02-26 DIAGNOSIS — Z888 Allergy status to other drugs, medicaments and biological substances status: Secondary | ICD-10-CM

## 2020-02-26 DIAGNOSIS — Z79899 Other long term (current) drug therapy: Secondary | ICD-10-CM

## 2020-02-26 DIAGNOSIS — Z87891 Personal history of nicotine dependence: Secondary | ICD-10-CM

## 2020-02-26 DIAGNOSIS — M797 Fibromyalgia: Secondary | ICD-10-CM | POA: Diagnosis present

## 2020-02-26 DIAGNOSIS — R269 Unspecified abnormalities of gait and mobility: Secondary | ICD-10-CM | POA: Diagnosis not present

## 2020-02-26 DIAGNOSIS — Z91041 Radiographic dye allergy status: Secondary | ICD-10-CM

## 2020-02-26 DIAGNOSIS — F419 Anxiety disorder, unspecified: Secondary | ICD-10-CM | POA: Diagnosis present

## 2020-02-26 DIAGNOSIS — I10 Essential (primary) hypertension: Secondary | ICD-10-CM | POA: Diagnosis present

## 2020-02-26 DIAGNOSIS — Z811 Family history of alcohol abuse and dependence: Secondary | ICD-10-CM | POA: Diagnosis not present

## 2020-02-26 DIAGNOSIS — T8140XA Infection following a procedure, unspecified, initial encounter: Secondary | ICD-10-CM

## 2020-02-26 DIAGNOSIS — T8142XD Infection following a procedure, deep incisional surgical site, subsequent encounter: Secondary | ICD-10-CM

## 2020-02-26 DIAGNOSIS — Z8261 Family history of arthritis: Secondary | ICD-10-CM

## 2020-02-26 DIAGNOSIS — Z6832 Body mass index (BMI) 32.0-32.9, adult: Secondary | ICD-10-CM | POA: Diagnosis not present

## 2020-02-26 DIAGNOSIS — T8143XA Infection following a procedure, organ and space surgical site, initial encounter: Secondary | ICD-10-CM | POA: Diagnosis present

## 2020-02-26 DIAGNOSIS — Z833 Family history of diabetes mellitus: Secondary | ICD-10-CM

## 2020-02-26 DIAGNOSIS — K219 Gastro-esophageal reflux disease without esophagitis: Secondary | ICD-10-CM | POA: Diagnosis present

## 2020-02-26 DIAGNOSIS — Z8249 Family history of ischemic heart disease and other diseases of the circulatory system: Secondary | ICD-10-CM | POA: Diagnosis not present

## 2020-02-26 DIAGNOSIS — Z96653 Presence of artificial knee joint, bilateral: Secondary | ICD-10-CM | POA: Diagnosis present

## 2020-02-26 DIAGNOSIS — I340 Nonrheumatic mitral (valve) insufficiency: Secondary | ICD-10-CM | POA: Diagnosis present

## 2020-02-26 DIAGNOSIS — E0500 Thyrotoxicosis with diffuse goiter without thyrotoxic crisis or storm: Secondary | ICD-10-CM | POA: Diagnosis present

## 2020-02-26 DIAGNOSIS — Y838 Other surgical procedures as the cause of abnormal reaction of the patient, or of later complication, without mention of misadventure at the time of the procedure: Secondary | ICD-10-CM | POA: Diagnosis present

## 2020-02-26 DIAGNOSIS — Z981 Arthrodesis status: Secondary | ICD-10-CM

## 2020-02-26 DIAGNOSIS — R7881 Bacteremia: Secondary | ICD-10-CM

## 2020-02-26 DIAGNOSIS — Z91040 Latex allergy status: Secondary | ICD-10-CM | POA: Diagnosis not present

## 2020-02-26 DIAGNOSIS — Z818 Family history of other mental and behavioral disorders: Secondary | ICD-10-CM

## 2020-02-26 DIAGNOSIS — M6281 Muscle weakness (generalized): Secondary | ICD-10-CM | POA: Diagnosis present

## 2020-02-26 DIAGNOSIS — Z7982 Long term (current) use of aspirin: Secondary | ICD-10-CM

## 2020-02-26 DIAGNOSIS — T8149XA Infection following a procedure, other surgical site, initial encounter: Secondary | ICD-10-CM | POA: Diagnosis not present

## 2020-02-26 DIAGNOSIS — Z823 Family history of stroke: Secondary | ICD-10-CM | POA: Diagnosis not present

## 2020-02-26 LAB — CBC WITH DIFFERENTIAL/PLATELET
Abs Immature Granulocytes: 0.23 10*3/uL — ABNORMAL HIGH (ref 0.00–0.07)
Basophils Absolute: 0 10*3/uL (ref 0.0–0.1)
Basophils Relative: 0 %
Eosinophils Absolute: 0.3 10*3/uL (ref 0.0–0.5)
Eosinophils Relative: 1 %
HCT: 28.3 % — ABNORMAL LOW (ref 36.0–46.0)
Hemoglobin: 8.5 g/dL — ABNORMAL LOW (ref 12.0–15.0)
Immature Granulocytes: 1 %
Lymphocytes Relative: 4 %
Lymphs Abs: 1 10*3/uL (ref 0.7–4.0)
MCH: 23.2 pg — ABNORMAL LOW (ref 26.0–34.0)
MCHC: 30 g/dL (ref 30.0–36.0)
MCV: 77.3 fL — ABNORMAL LOW (ref 80.0–100.0)
Monocytes Absolute: 1.2 10*3/uL — ABNORMAL HIGH (ref 0.1–1.0)
Monocytes Relative: 5 %
Neutro Abs: 22 10*3/uL — ABNORMAL HIGH (ref 1.7–7.7)
Neutrophils Relative %: 89 %
Platelets: 341 10*3/uL (ref 150–400)
RBC: 3.66 MIL/uL — ABNORMAL LOW (ref 3.87–5.11)
RDW: 19.4 % — ABNORMAL HIGH (ref 11.5–15.5)
WBC: 24.8 10*3/uL — ABNORMAL HIGH (ref 4.0–10.5)
nRBC: 0 % (ref 0.0–0.2)

## 2020-02-26 LAB — COMPREHENSIVE METABOLIC PANEL
ALT: 30 U/L (ref 0–44)
AST: 41 U/L (ref 15–41)
Albumin: 2.1 g/dL — ABNORMAL LOW (ref 3.5–5.0)
Alkaline Phosphatase: 106 U/L (ref 38–126)
Anion gap: 11 (ref 5–15)
BUN: 15 mg/dL (ref 6–20)
CO2: 28 mmol/L (ref 22–32)
Calcium: 8.1 mg/dL — ABNORMAL LOW (ref 8.9–10.3)
Chloride: 99 mmol/L (ref 98–111)
Creatinine, Ser: 0.86 mg/dL (ref 0.44–1.00)
GFR calc Af Amer: >60 mL/min (ref >60)
GFR calc non Af Amer: >60 mL/min (ref >60)
Glucose, Bld: 141 mg/dL — ABNORMAL HIGH (ref 70–99)
Potassium: 2.7 mmol/L — CL (ref 3.5–5.1)
Sodium: 138 mmol/L (ref 135–145)
Total Bilirubin: 0.7 mg/dL (ref 0.3–1.2)
Total Protein: 5.1 g/dL — ABNORMAL LOW (ref 6.5–8.1)

## 2020-02-26 LAB — APTT: aPTT: 32 seconds (ref 24–36)

## 2020-02-26 LAB — SARS CORONAVIRUS 2 BY RT PCR (HOSPITAL ORDER, PERFORMED IN ~~LOC~~ HOSPITAL LAB): SARS Coronavirus 2: NEGATIVE

## 2020-02-26 LAB — PROTIME-INR
INR: 1.1 (ref 0.8–1.2)
Prothrombin Time: 13.9 seconds (ref 11.4–15.2)

## 2020-02-26 LAB — LACTIC ACID, PLASMA: Lactic Acid, Venous: 1.8 mmol/L (ref 0.5–1.9)

## 2020-02-26 MED ORDER — ONDANSETRON HCL 4 MG PO TABS: 4.0000 mg | ORAL_TABLET | Freq: Four times a day (QID) | ORAL | Status: DC | PRN

## 2020-02-26 MED ORDER — MORPHINE SULFATE (PF) 2 MG/ML IV SOLN
2.0000 mg | INTRAVENOUS | Status: DC | PRN
  Administered 2020-02-27 – 2020-03-06 (×15): 2 mg via INTRAVENOUS
  Filled 2020-02-26 (×15): qty 1

## 2020-02-26 MED ORDER — DOCUSATE SODIUM 100 MG PO CAPS
100.0000 mg | ORAL_CAPSULE | Freq: Two times a day (BID) | ORAL | Status: DC
  Administered 2020-02-26 – 2020-03-07 (×16): 100 mg via ORAL
  Filled 2020-02-26 (×18): qty 1

## 2020-02-26 MED ORDER — SODIUM CHLORIDE 0.9% FLUSH
3.0000 mL | Freq: Two times a day (BID) | INTRAVENOUS | Status: DC
  Administered 2020-02-27 – 2020-03-07 (×9): 3 mL via INTRAVENOUS

## 2020-02-26 MED ORDER — ACETAMINOPHEN 325 MG PO TABS
650.0000 mg | ORAL_TABLET | Freq: Four times a day (QID) | ORAL | Status: DC | PRN
  Administered 2020-02-27 – 2020-03-07 (×12): 650 mg via ORAL
  Filled 2020-02-26 (×12): qty 2

## 2020-02-26 MED ORDER — POTASSIUM CHLORIDE IN NACL 20-0.9 MEQ/L-% IV SOLN
INTRAVENOUS | Status: DC
  Filled 2020-02-26 (×18): qty 1000

## 2020-02-26 MED ORDER — SODIUM CHLORIDE 0.9 % IV SOLN: 250.0000 mL | INTRAVENOUS | Status: DC | PRN

## 2020-02-26 MED ORDER — ACETAMINOPHEN 650 MG RE SUPP: 650.0000 mg | Freq: Four times a day (QID) | RECTAL | Status: DC | PRN

## 2020-02-26 MED ORDER — ONDANSETRON HCL 4 MG/2ML IJ SOLN: 4.0000 mg | Freq: Four times a day (QID) | INTRAMUSCULAR | Status: DC | PRN

## 2020-02-26 MED ORDER — ZOLPIDEM TARTRATE 5 MG PO TABS
5.0000 mg | ORAL_TABLET | Freq: Every evening | ORAL | Status: DC | PRN
  Administered 2020-03-03: 5 mg via ORAL
  Filled 2020-02-26: qty 1

## 2020-02-26 MED ORDER — METHOCARBAMOL 1000 MG/10ML IJ SOLN
500.0000 mg | Freq: Four times a day (QID) | INTRAVENOUS | Status: DC | PRN
  Filled 2020-02-26 (×3): qty 5

## 2020-02-26 MED ORDER — LIDOCAINE-EPINEPHRINE (PF) 2 %-1:200000 IJ SOLN: 1.7000 mL | Freq: Once | INTRAMUSCULAR | Status: DC

## 2020-02-26 MED ORDER — OXYCODONE HCL 5 MG PO TABS
5.0000 mg | ORAL_TABLET | ORAL | Status: DC | PRN
  Administered 2020-02-27 (×3): 5 mg via ORAL
  Filled 2020-02-26 (×3): qty 1

## 2020-02-26 MED ORDER — POTASSIUM CHLORIDE CRYS ER 20 MEQ PO TBCR
40.0000 meq | EXTENDED_RELEASE_TABLET | ORAL | Status: AC
  Administered 2020-02-26 – 2020-02-27 (×2): 40 meq via ORAL
  Filled 2020-02-26 (×2): qty 2

## 2020-02-26 MED ORDER — SODIUM CHLORIDE 0.9% FLUSH: 3.0000 mL | INTRAVENOUS | Status: DC | PRN

## 2020-02-26 MED ORDER — HEPARIN SODIUM (PORCINE) 5000 UNIT/ML IJ SOLN
5000.0000 [IU] | Freq: Three times a day (TID) | INTRAMUSCULAR | Status: DC
  Administered 2020-02-26 – 2020-03-07 (×26): 5000 [IU] via SUBCUTANEOUS
  Filled 2020-02-26 (×26): qty 1

## 2020-02-26 MED ORDER — LIDOCAINE-EPINEPHRINE (PF) 2 %-1:200000 IJ SOLN
INTRAMUSCULAR | Status: AC
  Filled 2020-02-26: qty 20

## 2020-02-26 NOTE — ED Triage Notes (Addendum)
Pt to ED via [facility] from home - was d/c'd from hospital on Tuesday-- had back surgery by Dr. Mikal Plane- had wound re-sutured prior to discharge, today Lumbar incision was draining consistantly, through 3 pads and onto mattress. Drainage sero-sanguinous- yellow drainage. Is able to move all extremeties, no difficulty voiding, had stabbing pain today in perineal area- with numbness in feet- difficulty moving feet today.

## 2020-02-26 NOTE — ED Notes (Addendum)
Per Admitting Provider Cabbell, no Blood Specimens (specifically the Blood Cultures,x2, nor Lactic Acids need to be collected.

## 2020-02-26 NOTE — Progress Notes (Signed)
CRITICAL VALUE STICKER  CRITICAL VALUE: 2.7 K+  RECEIVER (on-site recipient of call): Natasha Bence, RN  DATE & TIME NOTIFIED: 02/26/20 2210  MD NOTIFIED: Franky Macho, MD  TIME OF NOTIFICATION: 2234  RESPONSE:   80 mEq K+ PO once

## 2020-02-26 NOTE — ED Provider Notes (Signed)
[facility] EMERGENCY DEPARTMENT Provider Note   CSN: [id number] Arrival date & time: 02/26/20  1851     History Chief Complaint  Patient presents with  . Post-op Problem    [name] is a 59 y.o. female who presents to the ED today via EMS due to post operative problem. Pt had posterior lumbar fusion L2-3 on 05/12 by Dr. Franky Macho. Discharged on 05/18. Pt reports that on the day of discharge she noticed drainage from her incision; Dr. Franky Macho resutured her and discharged her on the same day. She reports that it has continued to drain while at home however today her family member noticed excessive drainage and called Dr. Franky Macho who advised pt come to the ED for further eval. Pt denies any fevers or chills at home. Her temp in the ED is 99.2. She reports ongoing pain however reports that the drainage is more concerning to her. She does mention that earlier today she had a few minutes of "numbness" in her BLE and BUEs which dissipated after applying voltaren gel.   The history is provided by the patient and medical records.       Past Medical History:  Diagnosis Date  . Anemia   . Anemia   . Ankle syndesmosis disruption   . Anxiety    takes Ativan and Valium, after mother passed  . Arthritis    bilateral knees s/p knee replacement bilaterally  . Asthma    but pt states not severe enough to even have an inhaler  . Bronchitis   . Bruising    pt states unexplained d/t fibromyalgia  . Chronic back pain    2012 tailbone surgery and 3 lower discs.   . Closed fracture of distal end of right fibula and tibia   . Depression    from Fibromyalgia diagnosis; not taking medicine. since 2001  . Dizziness    rarely  . Fibromyalgia    diagnosed 2001  . GERD (gastroesophageal reflux disease)    Prilosec occasionally  . Headache(784.0)    "sinus headaches"  . History of hiatal hernia   . Hypertension    since 2013  . Hyperthyroidism    subclinical, no treatment;  thyroid nodules  . IBS (irritable bowel syndrome)   . Impaired memory    states from fibromyalgia  . Insomnia    takes Ambien  . Jones fracture    left foot fifth metatarsal  . Multiple allergies    including latex, pet dander, shellfish, pet dander  . Painful orthopaedic hardware right ankle 05/27/2016  . Seasonal allergies   . Shortness of breath    with exertion;pt states that its related to her weight  . Sore gums    this is why pt is on Amoxil-only takes for dental work  . Tachycardia   . Thyroid goiter   . Varicose vein    protrudes above skin-per pt;vein popped and bruised;ultrasound done to make sure that there were no clots;noclots were found    Patient Active Problem List   Diagnosis Date Noted  . HNP (herniated nucleus pulposus), lumbar 02/16/2020  . Muscle weakness of lower extremity 02/14/2020  . Spondylolisthesis of lumbar region 10/30/2017  . Painful orthopaedic hardware right ankle 05/27/2016  . Closed fracture of distal end of right fibula and tibia   . Ankle syndesmosis disruption   . ASCUS favor benign 01/06/2014  . Leg swelling 12/22/2013  . Seasonal allergies   . Anemia   . Chronic back  pain due to DJD with history surgery   . Hyperthyroidism   . 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 Status post left total prosthetic replacement of knee joint using cement 03/24/2012  . Fibromyalgia   . GERD (gastroesophageal reflux disease)   . Anxiety   . Depression   . Hypertension   . Lumbar degenerative disc disease 09/25/2011    Past Surgical History:  Procedure Laterality Date  . ANTERIOR LUMBAR FUSION  09/20/2011   Procedure: ANTERIOR LUMBAR FUSION 1 LEVEL;  Surgeon: Carmela Hurt;  Location: [facility];  Service: Neurosurgery;  Laterality: N/A;  Lumbar five-Sacral One Anterior Lumbar Interbody Fusion /Dr. Early to Approach   . CERVICAL DISC SURGERY     WITH TITANIUM PLATE IN NECK---LEFT SIDE  . CERVICAL SPINE SURGERY   03/24/2019  . CESAREAN SECTION    . fibroidectomy    . FRACTURE SURGERY  05/08/2010   Jones fracture left foot fifth metatarsal  . HARDWARE REMOVAL Right 05/28/2016   Procedure: RIGHT ANKLE HARDWARE REMOVAL;  Surgeon: Salvatore Marvel, MD;  Location: [facility];  Service: Orthopedics;  Laterality: Right;  . IRRIGATION AND DEBRIDEMENT KNEE  04/09/2012   Procedure: IRRIGATION AND DEBRIDEMENT KNEE;  Surgeon: Thera Flake., MD;  Location: [facility];  Service: Orthopedics;  Laterality: Right;  . JOINT REPLACEMENT  01-27-2012   left total knee and Right total knee  . KNEE SURGERY  2005   Left knee arthroscopy  . NASAL SEPTOPLASTY W/ TURBINOPLASTY  2007   due to recurrent sinusitis  . ORIF ANKLE FRACTURE Right 06/21/2015   Procedure: OPEN REDUCTION INTERNAL FIXATION RIGHT DISTAL FIBULA  FRACTURE AND OPEN REDUCTION INTERNAL FIXATION SYNDESMOSIS ;  Surgeon: Salvatore Marvel, MD;  Location:  SURGERY CENTER;  Service: Orthopedics;  Laterality: Right;  . SHOULDER ARTHROSCOPY W/ ROTATOR CUFF REPAIR Right   . SHOULDER SURGERY Left   . SPINE SURGERY  2004   Cervical plate, ACDF  . STERIOD INJECTION  01/27/2012   Procedure: STEROID INJECTION;  Surgeon: Nilda Simmer, MD;  Location: [facility];  Service: Orthopedics;  Laterality: Right;  . THORACIC DISCECTOMY  02/16/2020   Procedure: Thoracic ten-eleven Discectomy;  Surgeon: Coletta Memos, MD;  [facility];  Service: Neurosurgery;;  . Buren Kos  . TOTAL KNEE ARTHROPLASTY  01/27/2012   Procedure: TOTAL KNEE ARTHROPLASTY;  Surgeon: Nilda Simmer, MD;  Bilateral  . TOTAL KNEE ARTHROPLASTY  04/06/2012   Procedure: TOTAL KNEE ARTHROPLASTY;  Surgeon: Nilda Simmer, MD;  Location: [facility];  Service: Orthopedics;  Laterality: Right;  . TUBAL LIGATION    . UTERINE FIBROID SURGERY     mid 200s     OB History   No obstetric history on file.     Family History  Problem Relation Age of Onset  . Stroke Father        passed 2004 from  pneumonia  . Alcohol abuse Father   . Pulmonary embolism Mother        after minor knee surgery leading to DVT  . Arthritis Sister   . Depression Sister   . Hypertension Sister   . Diabetes Other        grandmother  . Anesthesia problems Neg Hx   . Hypotension Neg Hx   . Malignant hyperthermia Neg Hx   . Pseudochol deficiency Neg Hx     Social History   Tobacco Use  . Smoking status: Former Smoker  Packs/day: 0.50    Years: 10.00    Pack years: 5.00    Quit date: 12/06/1998    Years since quitting: 21.2  . Smokeless tobacco: Never Used  Substance Use Topics  . Alcohol use: No  . Drug use: No    Home Medications Prior to Admission medications   Medication Sig Start Date End Date Taking? Authorizing Provider  albuterol (PROVENTIL HFA;VENTOLIN HFA) 108 (90 BASE) MCG/ACT inhaler Inhale 2 puffs into the lungs every 6 (six) hours as needed for wheezing or shortness of breath. 01/12/14   Marin Olp, MD  AMITIZA 24 MCG capsule Take 24 mcg by mouth 2 (two) times daily with a meal.  11/29/13   [provider]  aspirin EC 81 MG tablet Take 81 mg by mouth daily.    [provider]  atenolol (TENORMIN) 50 MG tablet Take 50 mg by mouth daily.    [provider]  CALCIUM-VITAMIN D PO Take 1 tablet by mouth 2 (two) times daily.    [provider]  CELEBREX 200 MG capsule Take 200 mg by mouth 2 (two) times daily.    [provider]  chlorzoxazone (PARAFON) 500 MG tablet Take 500 mg by mouth 3 (three) times daily.    [provider]  cyclobenzaprine (FLEXERIL) 10 MG tablet Take 10 mg by mouth 3 (three) times daily.    [provider]  diazepam (VALIUM) 5 MG tablet Take 5 mg by mouth 2 (two) times daily.    [provider]  diclofenac sodium (VOLTAREN) 1 % GEL Apply 1 application topically 2 (two) times daily as needed (shoulder, arm and leg pain). For right shoulder and arm and legs Patient taking differently: Apply  2 g topically 4 (four) times daily.  01/12/14   Marin Olp, MD  diphenhydrAMINE (BENADRYL) 25 MG tablet Take 50 mg by mouth every 6 (six) hours as needed for allergies.     [provider]  diphenhydramine-acetaminophen (TYLENOL PM) 25-500 MG TABS tablet Take 2 tablets by mouth at bedtime as needed (sleep/pain).    [provider]  furosemide (LASIX) 20 MG tablet Take 20 mg by mouth daily.    [provider]  gabapentin (NEURONTIN) 600 MG tablet Take 1,200 mg by mouth 3 (three) times daily.  10/17/17   [provider]  hydrochlorothiazide (HYDRODIURIL) 25 MG tablet Take 1 tablet (25 mg total) by mouth daily. One tablet by mouth in the morning. Patient taking differently: Take 25 mg by mouth daily.  01/04/14   Marin Olp, MD  hydrOXYzine (ATARAX/VISTARIL) 50 MG tablet Take 50 mg by mouth 2 (two) times daily.    [provider]  methocarbamol (ROBAXIN) 500 MG tablet Take 500 mg by mouth 4 (four) times daily.    [provider]  Multiple Vitamin (MULTIVITAMIN WITH MINERALS) TABS tablet Take 1 tablet by mouth daily.    [provider]  omeprazole (PRILOSEC) 20 MG capsule Take 20 mg by mouth 2 (two) times daily before a meal.    [provider]  ondansetron (ZOFRAN-ODT) 8 MG disintegrating tablet Take 8 mg by mouth 2 (two) times daily as needed for nausea or vomiting.    [provider]  OVER THE COUNTER MEDICATION Take 1 tablet by mouth daily. Neuriva Brain Supplement    [provider]  oxyCODONE-acetaminophen (PERCOCET) 10-325 MG tablet 1 tablet if needed for pain. Patient taking differently: Take 1 tablet by mouth 3 (three) times daily.  11/04/17   Coletta Memosabbell, Kyle, MD  senna (SENOKOT) 8.6 MG TABS tablet Take 1 tablet by mouth 2 (two) times daily.    [provider]  tiZANidine (ZANAFLEX) 4 MG tablet Take 1 tablet (4 mg total) by mouth every 6 (six) hours as needed for muscle spasms. Patient not  taking: Reported on 07/06/2019 02/24/19   Eyvonne MechanicHedges, Jeffrey, PA-C    Allergies    Monosodium glutamate, Shellfish allergy, Celebrex [celecoxib], Contrast media [iodinated diagnostic agents], Diclofenac, Other, and Latex  Review of Systems   Review of Systems  Constitutional: Negative for chills and fever.  Musculoskeletal: Positive for back pain.  Skin: Positive for wound.  All other systems reviewed and are negative.   Physical Exam Updated Vital Signs BP 111/61 (BP Location: Right Arm)   Pulse 81   Temp 99.2 F (37.3 C) (Oral)   Resp 16   LMP 02/18/2016   SpO2 100%   Physical Exam Vitals and nursing note reviewed.  Constitutional:      Appearance: She is not ill-appearing.  HENT:     Head: Normocephalic and atraumatic.  Eyes:     Conjunctiva/sclera: Conjunctivae normal.  Cardiovascular:     Rate and Rhythm: Normal rate and regular rhythm.  Pulmonary:     Effort: Pulmonary effort is normal.     Breath sounds: Normal breath sounds. No wheezing, rhonchi or rales.  Abdominal:     Palpations: Abdomen is soft.     Tenderness: There is no abdominal tenderness. There is no guarding or rebound.  Musculoskeletal:     Cervical back: Neck supple.     Comments: See photos below. Sutures in place; purulent drainage noted with palpation of back. + diffuse TTP. Strength 4/5 to BLEs. Sensation intact to dull and sharp sensation diffusely to BLE and perineum (chaperone present). 2+ DP pulses bilaterally.   Skin:    General: Skin is warm and dry.  Neurological:     Mental Status: She is alert.          ED Results / Procedures / Treatments   Labs (all labs ordered are listed, but only abnormal results are displayed) Labs Reviewed  CULTURE, BLOOD (ROUTINE X 2)  CULTURE, BLOOD (ROUTINE X 2)  URINE CULTURE  SARS CORONAVIRUS 2 BY RT PCR (HOSPITAL ORDER, PERFORMED IN [facility])  LACTIC ACID, PLASMA  LACTIC ACID, PLASMA  COMPREHENSIVE METABOLIC PANEL  CBC WITH  DIFFERENTIAL/PLATELET  URINALYSIS, ROUTINE W REFLEX MICROSCOPIC    EKG None  Radiology No results found.  Procedures Procedures (including critical care time)  Medications Ordered in ED Medications - No data to display  ED Course  I have reviewed the triage vital signs and the nursing notes.  Pertinent labs & imaging results that were available during my care of the patient were reviewed by me and considered in my medical decision making (see chart for details).    MDM Rules/Calculators/A&P                     59 year old female who presents to the ED with postop problem.  Had L2-L3 fusion done on 5/12 with resuturing 5/18 prior to discharge s/2 drainage from incision site by Dr. Franky Machoabbell.  Wound has continued to drain at home.  Family noticed thing today and call Dr. Yetta Glassmanabello who told him to come to the ED for further evaluation.  Arrival to the ED patient's temp is 99.2.  She denies any fevers or chills at home.  She  is nontachycardic and nontachypneic.  She appears to be in good spirits.  Vision with sutures intact.  With palpation to her back purulent drainage does come out of the incision site.  She has equal strength in her bilateral lower extremities and sensation intact throughout.  Will work-up for infection at this time and consult Dr. Franky Macho who is on-call.  Discussed case with Dr. Franky Macho who will come admit patient.   Final Clinical Impression(s) / ED Diagnoses Final diagnoses:  Postoperative infection, unspecified type, initial encounter    Rx / DC Orders ED Discharge Orders    None       Tanda Rockers, PA-C 02/26/20 1959    Margarita Grizzle, MD 02/28/20 1331

## 2020-02-26 NOTE — H&P (Signed)
BP 111/61 (BP Location: Right Arm)   Pulse 81   Temp 99.2 F (37.3 C) (Oral)   Resp 16   LMP 02/18/2016   SpO2 100%  [name] called the office today and her niece stated that she is unable to walk and that she has had significant drainage from her wound. I instructed her to come to the ED so she can be admitted, and the wound closed.  Allergies  Allergen Reactions  . Monosodium Glutamate Anaphylaxis and Swelling    Eyes swollen shut, facial swelling, tongue swelling.  . Shellfish Allergy Anaphylaxis  . Celebrex [Celecoxib] Itching    Only allergic to generic brand  . Contrast Media [Iodinated Diagnostic Agents] Itching and Nausea Only    "could not walk"  . Diclofenac Itching    Can take the name brand Voltaren  . Other Other (See Comments)    Pet dander, Mildew, Mold  Cannot tolerate generic DICLOFENAC GEL--MUST USE BRAND NAME (GENERIC CAUSES ITCHING)   . Latex Itching and Rash    Latex glove with powder   Past Medical History:  Diagnosis Date  . Anemia   . Anemia   . Ankle syndesmosis disruption   . Anxiety    takes Ativan and Valium, after mother passed  . Arthritis    bilateral knees s/p knee replacement bilaterally  . Asthma    but pt states not severe enough to even have an inhaler  . Bronchitis   . Bruising    pt states unexplained d/t fibromyalgia  . Chronic back pain    2012 tailbone surgery and 3 lower discs.   . Closed fracture of distal end of right fibula and tibia   . Depression    from Fibromyalgia diagnosis; not taking medicine. since 2001  . Dizziness    rarely  . Fibromyalgia    diagnosed 2001  . GERD (gastroesophageal reflux disease)    Prilosec occasionally  . Headache(784.0)    "sinus headaches"  . History of hiatal hernia   . Hypertension    since 2013  . Hyperthyroidism    subclinical, no treatment; thyroid nodules  . IBS (irritable bowel syndrome)   . Impaired memory    states from fibromyalgia  . Insomnia    takes Ambien  .  Jones fracture    left foot fifth metatarsal  . Multiple allergies    including latex, pet dander, shellfish, pet dander  . Painful orthopaedic hardware right ankle 05/27/2016  . Seasonal allergies   . Shortness of breath    with exertion;pt states that its related to her weight  . Sore gums    this is why pt is on Amoxil-only takes for dental work  . Tachycardia   . Thyroid goiter   . Varicose vein    protrudes above skin-per pt;vein popped and bruised;ultrasound done to make sure that there were no clots;noclots were found   Past Surgical History:  Procedure Laterality Date  . ANTERIOR LUMBAR FUSION  09/20/2011   Procedure: ANTERIOR LUMBAR FUSION 1 LEVEL;  Surgeon: [name];  Location: [facility];  Service: Neurosurgery;  Laterality: N/A;  Lumbar five-Sacral One Anterior Lumbar Interbody Fusion /Dr. Early to Approach   . CERVICAL DISC SURGERY     WITH TITANIUM PLATE IN NECK---LEFT SIDE  . CERVICAL SPINE SURGERY  03/24/2019  . CESAREAN SECTION    . fibroidectomy    . FRACTURE SURGERY  05/08/2010   Jones fracture left foot fifth metatarsal  .  HARDWARE REMOVAL Right 05/28/2016   Procedure: RIGHT ANKLE HARDWARE REMOVAL;  Surgeon: Jill Saas, MD;  Location: [place];  Service: Orthopedics;  Laterality: Right;  . IRRIGATION AND DEBRIDEMENT KNEE  04/09/2012   Procedure: IRRIGATION AND DEBRIDEMENT KNEE;  Surgeon: Jill Rack., MD;  Location: [place];  Service: Orthopedics;  Laterality: Right;  . JOINT REPLACEMENT  01-27-2012   left total knee and Right total knee  . KNEE SURGERY  2005   Left knee arthroscopy  . NASAL SEPTOPLASTY W/ TURBINOPLASTY  2007   due to recurrent sinusitis  . ORIF ANKLE FRACTURE Right 06/21/2015   Procedure: OPEN REDUCTION INTERNAL FIXATION RIGHT DISTAL FIBULA  FRACTURE AND OPEN REDUCTION INTERNAL FIXATION SYNDESMOSIS ;  Surgeon: Jill Saas, MD;  Location: [place];  Service: Orthopedics;  Laterality: Right;  . SHOULDER  ARTHROSCOPY W/ ROTATOR CUFF REPAIR Right   . SHOULDER SURGERY Left   . SPINE SURGERY  2004   Cervical plate, ACDF  . STERIOD INJECTION  01/27/2012   Procedure: STEROID INJECTION;  Surgeon: Jill Junes, MD;  Location: [place];  Service: Orthopedics;  Laterality: Right;  . THORACIC DISCECTOMY  02/16/2020   Procedure: Thoracic ten-eleven Discectomy;  Surgeon: Jill Pall, MD;  Location: [place];  Service: Neurosurgery;;  . [name]  . TOTAL KNEE ARTHROPLASTY  01/27/2012   Procedure: TOTAL KNEE ARTHROPLASTY;  Surgeon: Jill Junes, MD;  Bilateral  . TOTAL KNEE ARTHROPLASTY  04/06/2012   Procedure: TOTAL KNEE ARTHROPLASTY;  Surgeon: Jill Junes, MD;  Location: [place];  Service: Orthopedics;  Laterality: Right;  . TUBAL LIGATION    . UTERINE FIBROID SURGERY     mid 18s   Family History  Problem Relation Age of Onset  . Stroke Father        passed 2004 from pneumonia  . Alcohol abuse Father   . Pulmonary embolism Mother        after minor knee surgery leading to DVT  . Arthritis Sister   . Depression Sister   . Hypertension Sister   . Diabetes Other        grandmother  . Anesthesia problems Neg Hx   . Hypotension Neg Hx   . Malignant hyperthermia Neg Hx   . Pseudochol deficiency Neg Hx    Social History   Socioeconomic History  . Marital status: Divorced    Spouse name: Not on file  . Number of children: Not on file  . Years of education: Not on file  . Highest education level: Not on file  Occupational History  . Not on file  Tobacco Use  . Smoking status: Former Smoker    Packs/day: 0.50    Years: 10.00    Pack years: 5.00    Quit date: 12/06/1998    Years since quitting: 21.2  . Smokeless tobacco: Never Used  Substance and Sexual Activity  . Alcohol use: No  . Drug use: No  . Sexual activity: Never  Other Topics Concern  . Not on file  Social History Narrative   Divorced. 1 son.    Disabled from fibromyalgia, arthritis, back and knee surgeries.     Graduated from High school.       Sister is a patient in our clinic, [name].    Recently cared for at [facility]. Changed to [place] as new network.    Social Determinants of Health   Financial Resource Strain:   . Difficulty  of Paying Living Expenses:   Food Insecurity:   . Worried About Programme researcher, broadcasting/film/video in the Last Year:   . Barista in the Last Year:   Transportation Needs:   . Freight forwarder (Medical):   Marland Kitchen Lack of Transportation (Non-Medical):   Physical Activity:   . Days of Exercise per Week:   . Minutes of Exercise per Session:   Stress:   . Feeling of Stress :   Social Connections:   . Frequency of Communication with Friends and Family:   . Frequency of Social Gatherings with Friends and Family:   . Attends Religious Services:   . Active Member of Clubs or Organizations:   . Attends Banker Meetings:   Marland Kitchen Marital Status:   Intimate Partner Violence:   . Fear of Current or Ex-Partner:   . Emotionally Abused:   Marland Kitchen Physically Abused:   . Sexually Abused:    Physical Exam Constitutional:      Appearance: Normal appearance. She is obese.  HENT:     Head: Normocephalic and atraumatic.     Right Ear: Tympanic membrane normal.     Left Ear: Tympanic membrane normal.     Nose: Nose normal.     Mouth/Throat:     Mouth: Mucous membranes are moist.     Pharynx: Oropharynx is clear.  Eyes:     Extraocular Movements: Extraocular movements intact.     Conjunctiva/sclera: Conjunctivae normal.     Pupils: Pupils are equal, round, and reactive to light.  Cardiovascular:     Rate and Rhythm: Normal rate and regular rhythm.  Pulmonary:     Effort: Pulmonary effort is normal.     Breath sounds: Normal breath sounds.  Abdominal:     General: Abdomen is flat.     Palpations: Abdomen is soft.  Musculoskeletal:        General: Normal range of motion.     Cervical back: Normal range of motion and neck supple.  Skin:    General:  Skin is warm.  Neurological:     Mental Status: She is alert.     Cranial Nerves: Cranial nerves are intact.     Sensory: Sensation is intact.     Motor: Weakness present.     Comments: Weakness right lower extremity Gait not assessed Normal movement upper extremities, normal strength   Admit for inability to walk, wound drainage

## 2020-02-27 ENCOUNTER — Encounter (HOSPITAL_COMMUNITY)
Admission: EM | Disposition: A | Discharge status: Skilled Nursing Facility | Payer: Self-pay | Source: Home / Self Care | Attending: Neurosurgery

## 2020-02-27 ENCOUNTER — Inpatient Hospital Stay (HOSPITAL_COMMUNITY): Payer: Medicare Other | Admitting: Certified Registered"

## 2020-02-27 ENCOUNTER — Encounter (HOSPITAL_COMMUNITY): Payer: Self-pay | Admitting: Certified Registered"

## 2020-02-27 HISTORY — PX: WOUND EXPLORATION: SHX6188

## 2020-02-27 LAB — BLOOD CULTURE ID PANEL (REFLEXED)

## 2020-02-27 LAB — BASIC METABOLIC PANEL
Anion gap: 7 (ref 5–15)
BUN: 15 mg/dL (ref 6–20)
CO2: 27 mmol/L (ref 22–32)
Calcium: 7.7 mg/dL — ABNORMAL LOW (ref 8.9–10.3)
Chloride: 103 mmol/L (ref 98–111)
Creatinine, Ser: 0.62 mg/dL (ref 0.44–1.00)
GFR calc Af Amer: >60 mL/min (ref >60)
GFR calc non Af Amer: >60 mL/min (ref >60)
Glucose, Bld: 103 mg/dL — ABNORMAL HIGH (ref 70–99)
Potassium: 2.9 mmol/L — ABNORMAL LOW (ref 3.5–5.1)
Sodium: 137 mmol/L (ref 135–145)

## 2020-02-27 LAB — LACTIC ACID, PLASMA: Lactic Acid, Venous: 1.9 mmol/L (ref 0.5–1.9)

## 2020-02-27 SURGERY — WOUND EXPLORATION
Anesthesia: General

## 2020-02-27 SURGERY — WOUND EXPLORATION
Anesthesia: General | Site: Back

## 2020-02-27 MED ORDER — DEXAMETHASONE SODIUM PHOSPHATE 10 MG/ML IJ SOLN
INTRAMUSCULAR | Status: DC | PRN
  Administered 2020-02-27: 10 mg via INTRAVENOUS

## 2020-02-27 MED ORDER — SODIUM CHLORIDE 0.9 % IV SOLN
300.0000 mg | Freq: Two times a day (BID) | INTRAVENOUS | Status: DC
  Administered 2020-02-27 – 2020-02-28 (×3): 300 mg via INTRAVENOUS
  Filled 2020-02-27 (×5): qty 300

## 2020-02-27 MED ORDER — DEXAMETHASONE SODIUM PHOSPHATE 10 MG/ML IJ SOLN
INTRAMUSCULAR | Status: AC
  Filled 2020-02-27: qty 1

## 2020-02-27 MED ORDER — PHENYLEPHRINE HCL (PRESSORS) 10 MG/ML IV SOLN
INTRAVENOUS | Status: DC | PRN
  Administered 2020-02-27 (×2): 80 ug via INTRAVENOUS

## 2020-02-27 MED ORDER — MIDAZOLAM HCL 5 MG/5ML IJ SOLN
INTRAMUSCULAR | Status: DC | PRN
  Administered 2020-02-27: 2 mg via INTRAVENOUS

## 2020-02-27 MED ORDER — SUFENTANIL CITRATE 50 MCG/ML IV SOLN
INTRAVENOUS | Status: AC
  Filled 2020-02-27: qty 1

## 2020-02-27 MED ORDER — POTASSIUM CHLORIDE CRYS ER 20 MEQ PO TBCR: 40.0000 meq | EXTENDED_RELEASE_TABLET | ORAL | Status: DC

## 2020-02-27 MED ORDER — ONDANSETRON HCL 4 MG/2ML IJ SOLN
INTRAMUSCULAR | Status: AC
  Filled 2020-02-27: qty 2

## 2020-02-27 MED ORDER — LIDOCAINE 2% (20 MG/ML) 5 ML SYRINGE
INTRAMUSCULAR | Status: AC
  Filled 2020-02-27: qty 5

## 2020-02-27 MED ORDER — POTASSIUM CHLORIDE CRYS ER 20 MEQ PO TBCR
80.0000 meq | EXTENDED_RELEASE_TABLET | Freq: Once | ORAL | Status: AC
  Administered 2020-02-27: 80 meq via ORAL
  Filled 2020-02-27: qty 4

## 2020-02-27 MED ORDER — OXYCODONE HCL 5 MG PO TABS: 5.0000 mg | ORAL_TABLET | Freq: Once | ORAL | Status: DC | PRN

## 2020-02-27 MED ORDER — ONDANSETRON HCL 4 MG/2ML IJ SOLN: 4.0000 mg | Freq: Once | INTRAMUSCULAR | Status: DC | PRN

## 2020-02-27 MED ORDER — PROPOFOL 10 MG/ML IV BOLUS
INTRAVENOUS | Status: DC | PRN
  Administered 2020-02-27: 170 mg via INTRAVENOUS

## 2020-02-27 MED ORDER — LIDOCAINE HCL (CARDIAC) PF 100 MG/5ML IV SOSY
PREFILLED_SYRINGE | INTRAVENOUS | Status: DC | PRN
  Administered 2020-02-27: 100 mg via INTRATRACHEAL

## 2020-02-27 MED ORDER — ALBUMIN HUMAN 5 % IV SOLN
12.5000 g | Freq: Once | INTRAVENOUS | Status: AC
  Administered 2020-02-27: 12.5 g via INTRAVENOUS

## 2020-02-27 MED ORDER — SUFENTANIL CITRATE 50 MCG/ML IV SOLN
INTRAVENOUS | Status: DC | PRN
  Administered 2020-02-27 (×2): 20 ug via INTRAVENOUS

## 2020-02-27 MED ORDER — ALBUMIN HUMAN 5 % IV SOLN
INTRAVENOUS | Status: AC
  Filled 2020-02-27: qty 250

## 2020-02-27 MED ORDER — NAFCILLIN SODIUM 2 G IJ SOLR
2.0000 g | INTRAMUSCULAR | Status: DC
  Filled 2020-02-27 (×4): qty 2000

## 2020-02-27 MED ORDER — ACETAMINOPHEN 10 MG/ML IV SOLN
INTRAVENOUS | Status: DC | PRN
  Administered 2020-02-27: 1000 mg via INTRAVENOUS

## 2020-02-27 MED ORDER — SODIUM CHLORIDE (PF) 0.9 % IJ SOLN
INTRAMUSCULAR | Status: AC
  Filled 2020-02-27: qty 10

## 2020-02-27 MED ORDER — FENTANYL CITRATE (PF) 100 MCG/2ML IJ SOLN: 25.0000 ug | INTRAMUSCULAR | Status: DC | PRN

## 2020-02-27 MED ORDER — PROPOFOL 10 MG/ML IV BOLUS
INTRAVENOUS | Status: AC
  Filled 2020-02-27: qty 20

## 2020-02-27 MED ORDER — SUCCINYLCHOLINE CHLORIDE 200 MG/10ML IV SOSY
PREFILLED_SYRINGE | INTRAVENOUS | Status: AC
  Filled 2020-02-27: qty 10

## 2020-02-27 MED ORDER — OXYCODONE HCL 5 MG/5ML PO SOLN: 5.0000 mg | Freq: Once | ORAL | Status: DC | PRN

## 2020-02-27 MED ORDER — ONDANSETRON HCL 4 MG/2ML IJ SOLN
INTRAMUSCULAR | Status: DC | PRN
  Administered 2020-02-27: 4 mg via INTRAVENOUS

## 2020-02-27 MED ORDER — SODIUM CHLORIDE 0.9 % IV SOLN
2.0000 g | INTRAVENOUS | Status: DC
  Administered 2020-02-27 – 2020-02-28 (×3): 2 g via INTRAVENOUS
  Filled 2020-02-27 (×7): qty 2000

## 2020-02-27 MED ORDER — LACTATED RINGERS IV SOLN: INTRAVENOUS | Status: DC | PRN

## 2020-02-27 MED ORDER — PHENYLEPHRINE 40 MCG/ML (10ML) SYRINGE FOR IV PUSH (FOR BLOOD PRESSURE SUPPORT)
PREFILLED_SYRINGE | INTRAVENOUS | Status: AC
  Filled 2020-02-27: qty 10

## 2020-02-27 MED ORDER — SUCCINYLCHOLINE CHLORIDE 20 MG/ML IJ SOLN
INTRAMUSCULAR | Status: DC | PRN
  Administered 2020-02-27: 145 mg via INTRAVENOUS

## 2020-02-27 MED ORDER — ROCURONIUM BROMIDE 100 MG/10ML IV SOLN
INTRAVENOUS | Status: DC | PRN
  Administered 2020-02-27: 50 mg via INTRAVENOUS

## 2020-02-27 MED ORDER — SUGAMMADEX SODIUM 200 MG/2ML IV SOLN
INTRAVENOUS | Status: DC | PRN
  Administered 2020-02-27: 200 mg via INTRAVENOUS

## 2020-02-27 MED ORDER — 0.9 % SODIUM CHLORIDE (POUR BTL) OPTIME
TOPICAL | Status: DC | PRN
  Administered 2020-02-27 (×5): 1000 mL

## 2020-02-27 MED ORDER — ROCURONIUM BROMIDE 10 MG/ML (PF) SYRINGE
PREFILLED_SYRINGE | INTRAVENOUS | Status: AC
  Filled 2020-02-27: qty 10

## 2020-02-27 MED ORDER — MIDAZOLAM HCL 2 MG/2ML IJ SOLN
INTRAMUSCULAR | Status: AC
  Filled 2020-02-27: qty 2

## 2020-02-27 SURGICAL SUPPLY — 43 items
APL SKNCLS STERI-STRIP NONHPOA (GAUZE/BANDAGES/DRESSINGS) ×1
BAG DECANTER FOR FLEXI CONT (MISCELLANEOUS) ×3 IMPLANT
BENZOIN TINCTURE PRP APPL 2/3 (GAUZE/BANDAGES/DRESSINGS) ×3 IMPLANT
BLADE CLIPPER SURG (BLADE) IMPLANT
CANISTER SUCT 3000ML PPV (MISCELLANEOUS) ×3 IMPLANT
CARTRIDGE OIL MAESTRO DRILL (MISCELLANEOUS) ×1 IMPLANT
CLOSURE WOUND 1/2 X4 (GAUZE/BANDAGES/DRESSINGS)
COVER WAND RF STERILE (DRAPES) ×3 IMPLANT
DIFFUSER DRILL AIR PNEUMATIC (MISCELLANEOUS) ×3 IMPLANT
DRAPE LAPAROTOMY 100X72 PEDS (DRAPES) IMPLANT
DRAPE LAPAROTOMY 100X72X124 (DRAPES) IMPLANT
DRAPE SURG 17X23 STRL (DRAPES) ×12 IMPLANT
DRSG VAC ATS LRG SENSATRAC (GAUZE/BANDAGES/DRESSINGS) ×4 IMPLANT
DRSG VAC ATS MED SENSATRAC (GAUZE/BANDAGES/DRESSINGS) ×2 IMPLANT
DRSG VAC ATS SM SENSATRAC (GAUZE/BANDAGES/DRESSINGS) ×2 IMPLANT
ELECT REM PT RETURN 9FT ADLT (ELECTROSURGICAL) ×3
ELECTRODE REM PT RTRN 9FT ADLT (ELECTROSURGICAL) ×1 IMPLANT
GAUZE 4X4 16PLY RFD (DISPOSABLE) IMPLANT
GAUZE SPONGE 4X4 12PLY STRL (GAUZE/BANDAGES/DRESSINGS) ×3 IMPLANT
GLOVE ECLIPSE 6.5 STRL STRAW (GLOVE) ×1 IMPLANT
GLOVE EXAM NITRILE XL STR (GLOVE) IMPLANT
GLOVE SURG SS PI 6.5 STRL IVOR (GLOVE) ×2 IMPLANT
GLOVE SURG SS PI 7.5 STRL IVOR (GLOVE) ×2 IMPLANT
GOWN STRL REUS W/ TWL LRG LVL3 (GOWN DISPOSABLE) IMPLANT
GOWN STRL REUS W/ TWL XL LVL3 (GOWN DISPOSABLE) IMPLANT
GOWN STRL REUS W/TWL LRG LVL3 (GOWN DISPOSABLE) ×6
GOWN STRL REUS W/TWL XL LVL3 (GOWN DISPOSABLE)
KIT BASIN OR (CUSTOM PROCEDURE TRAY) ×3 IMPLANT
KIT TURNOVER KIT B (KITS) ×3 IMPLANT
NEEDLE HYPO 22GX1.5 SAFETY (NEEDLE) IMPLANT
NS IRRIG 1000ML POUR BTL (IV SOLUTION) ×3 IMPLANT
OIL CARTRIDGE MAESTRO DRILL (MISCELLANEOUS) ×3
PACK LAMINECTOMY NEURO (CUSTOM PROCEDURE TRAY) ×3 IMPLANT
PAD ARMBOARD 7.5X6 YLW CONV (MISCELLANEOUS) ×9 IMPLANT
STRIP CLOSURE SKIN 1/2X4 (GAUZE/BANDAGES/DRESSINGS) ×1 IMPLANT
SUT VIC AB 1 CT1 18XBRD ANBCTR (SUTURE) ×1 IMPLANT
SUT VIC AB 1 CT1 8-18 (SUTURE)
SUT VIC AB 2-0 CP2 18 (SUTURE) ×1 IMPLANT
SWAB COLLECTION DEVICE MRSA (MISCELLANEOUS) IMPLANT
SWAB CULTURE ESWAB REG 1ML (MISCELLANEOUS) IMPLANT
TOWEL GREEN STERILE (TOWEL DISPOSABLE) ×3 IMPLANT
TOWEL GREEN STERILE FF (TOWEL DISPOSABLE) ×3 IMPLANT
WATER STERILE IRR 1000ML POUR (IV SOLUTION) ×3 IMPLANT

## 2020-02-27 NOTE — Op Note (Signed)
02/27/2020  11:02 PM  PATIENT:  [name]  59 y.o. female  PRE-OPERATIVE DIAGNOSIS:  Wound Infection  POST-OPERATIVE DIAGNOSIS:  Wound Infection  PROCEDURE:  Procedure(s): WOUND EXPLORATION  SURGEON: Surgeon(s): Coletta Memos, MD  ASSISTANTS:none  ANESTHESIA:   general  EBL:  Total I/O In: 1500 [I.V.:1300; IV Piggyback:200] Out: 10 [Blood:10]  BLOOD ADMINISTERED:none  CELL SAVER GIVEN:none  COUNT:per nursing  DRAINS: wound vac   SPECIMEN:  Aspirate  DICTATION: [name] was taken to the operating room, intubated, and placed under a general anesthetic without difficulty. She was positioned prone on a Wilson frame with all pressure points padded. Her lumbar region was prepped and draped in a sterile manner. Purulent liquid was draining from both wounds prior to opening the skin. I opened the thoracic incision first, and large quantities of pus drained. I irrigated copiously. The infection was infrafascial. I packed the wound then with gauze. I opened the lumbar incision which was identical in that there was a great deal of pus, the infection went below the fascia. I irrigated for a total of 4 liters in the lumbar incision. The hardware was intact, and was not loose.  I packed the wounds with granufoam sponges then applied the wound vac dressing. There was no leak and the wound vac was left on suction. She was moved to the bed, and extubated.   PLAN OF CARE: Admit to inpatient   PATIENT DISPOSITION:  PACU - hemodynamically stable.   Delay start of Pharmacological VTE agent (>24hrs) due to surgical blood loss or risk of bleeding:  no

## 2020-02-27 NOTE — Anesthesia Procedure Notes (Signed)
Procedure Name: Intubation Date/Time: 02/27/2020 9:38 PM Performed by: Claris Che, CRNA Pre-anesthesia Checklist: Patient identified, Emergency Drugs available, Suction available, Patient being monitored and Timeout performed Patient Re-evaluated:Patient Re-evaluated prior to induction Oxygen Delivery Method: Circle system utilized Preoxygenation: Pre-oxygenation with 100% oxygen Induction Type: IV induction, Rapid sequence and Cricoid Pressure applied Laryngoscope Size: Mac and 4 Grade View: Grade II Tube type: Oral Number of attempts: 1 Airway Equipment and Method: Stylet Placement Confirmation: ETT inserted through vocal cords under direct vision,  positive ETCO2 and breath sounds checked- equal and bilateral Secured at: 23 cm Tube secured with: Tape Dental Injury: Teeth and Oropharynx as per pre-operative assessment

## 2020-02-27 NOTE — Transfer of Care (Signed)
Immediate Anesthesia Transfer of Care Note  Patient: [name]  Procedure(s) Performed: WOUND EXPLORATION (N/A Back)  Patient Location: PACU  Anesthesia Type:General  Level of Consciousness: awake, oriented, drowsy and patient cooperative  Airway & Oxygen Therapy: Patient Spontanous Breathing and Patient connected to nasal cannula oxygen  Post-op Assessment: Report given to RN, Post -op Vital signs reviewed and stable and Patient moving all extremities X 4  Post vital signs: Reviewed and stable  Last Vitals:  Vitals Value Taken Time  BP 134/67 02/27/20 2250  Temp    Pulse 54 02/27/20 2250  Resp 34 02/27/20 2253  SpO2 99 % 02/27/20 2250  Vitals shown include unvalidated device data.  Last Pain:  Vitals:   02/27/20 2026  TempSrc: Oral  PainSc:       Patients Stated Pain Goal: 0 (02/27/20 2021)  Complications: No apparent anesthesia complications

## 2020-02-27 NOTE — Consult Note (Signed)
[facility]  Total days of antibiotics 0               Reason for Consult:mssa bacteremia, concern for post op infection    Referring Physician: auto-consult  Active Problems:   Abnormal gait due to muscle weakness    HPI: [name] is a 59 y.o. female with hx of fibromyalgia underwent surgery on 5/12 for L2-L3 disc hernitation Lumbar 2-3 Posterior lumbar interbody fusion, Titanium cages 9 x26 mm packed with autograft morsels, and in the disc space Laminectomy L2 in excess of the needed exposure for a plif.  Segmental pedicle screw fixation L2-4 Thoracic ten-eleven Discectomy, left  The day prior to discharge, the patient reports she had some drainage from lumbar surgical incision.  Initially did well with improvement in pain but then over the course of last week having swelling of legs/feet, and increase with pain. She has chills at baseline so unclear if she has subjective fevers. She reports increasing drainage (greyish-whitish is color) from incision the day prior to admit. Photos on admit 5/22 document purulent discharge. On admit, WBC elevated at 24K. Blood cx have identified MSSA. Dr Franky Macho planning to take her to the OR for washout for likely post surgical site infection   Past Medical History:  Diagnosis Date  . Anemia   . Anemia   . Ankle syndesmosis disruption   . Anxiety    takes Ativan and Valium, after mother passed  . Arthritis    bilateral knees s/p knee replacement bilaterally  . Asthma    but pt states not severe enough to even have an inhaler  . Bronchitis   . Bruising    pt states unexplained d/t fibromyalgia  . Chronic back pain    2012 tailbone surgery and 3 lower discs.   . Closed fracture of distal end of right fibula and tibia   . Depression    from Fibromyalgia diagnosis; not taking medicine. since 2001  . Dizziness    rarely  . Fibromyalgia    diagnosed 2001  . GERD (gastroesophageal reflux disease)    Prilosec occasionally  . Headache(784.0)    "sinus headaches"  . History of hiatal hernia   . Hypertension    since 2013  . Hyperthyroidism    subclinical, no treatment; thyroid nodules  . IBS (irritable bowel syndrome)   . Impaired memory    states from fibromyalgia  . Insomnia    takes Ambien  . Jones fracture    left foot fifth metatarsal  . Multiple allergies    including latex, pet dander, shellfish, pet dander  . Painful orthopaedic hardware right ankle 05/27/2016  . Seasonal allergies   . Shortness of breath    with exertion;pt states that its related to her weight  . Sore gums    this is why pt is on Amoxil-only takes for dental work  . Tachycardia   . Thyroid goiter   . Varicose vein    protrudes above skin-per pt;vein popped and bruised;ultrasound done to make sure that there were no clots;noclots were found    Allergies:  Allergies  Allergen Reactions  . Monosodium Glutamate Anaphylaxis and Swelling    Eyes swollen shut, facial swelling, tongue swelling.  . Shellfish Allergy Anaphylaxis  . Celebrex [Celecoxib] Itching    Only allergic to generic brand  . Contrast Media [Iodinated Diagnostic Agents] Itching and Nausea Only    "could not walk"  . Diclofenac Itching  Can take the name brand Voltaren  . Other Other (See Comments)    Pet dander, Mildew, Mold  Cannot tolerate generic DICLOFENAC GEL--MUST USE BRAND NAME ([place])   . Latex Itching and Rash    Latex glove with powder    Current antibiotics:   MEDICATIONS: . docusate sodium  100 mg Oral BID  . heparin  5,000 Units Subcutaneous Q8H  . lidocaine-EPINEPHrine  1.7 mL Intradermal Once  . sodium chloride flush  3 mL Intravenous Q12H    Social History   Tobacco Use  . Smoking status: Former Smoker    Packs/day: 0.50    Years: 10.00    Pack years: 5.00    Quit date: 12/06/1998    Years since quitting: 21.2  . Smokeless tobacco: Never Used  Substance Use Topics  . Alcohol  use: No  . Drug use: No    Family History  Problem Relation Age of Onset  . Stroke Father        passed 2004 from pneumonia  . Alcohol abuse Father   . Pulmonary embolism Mother        after minor knee surgery leading to DVT  . Arthritis Sister   . Depression Sister   . Hypertension Sister   . Diabetes Other        grandmother  . Anesthesia problems Neg Hx   . Hypotension Neg Hx   . Malignant hyperthermia Neg Hx   . Pseudochol deficiency Neg Hx     Review of Systems - +back pain, increase drainage from incision site, +chills. + swelling of lower extremities. 12 point ros is otherwise negative   OBJECTIVE: Temp:  [97.8 F (36.6 C)-99.2 F (37.3 C)] 98.5 F (36.9 C) (05/23 1549) Pulse Rate:  [67-90] 85 (05/23 1549) Resp:  [16-20] 20 (05/23 1549) BP: (93-158)/(56-142) 112/58 (05/23 1549) SpO2:  [94 %-100 %] 100 % (05/23 1549) Physical Exam  Constitutional:  oriented to person, place, and time. appears well-developed and well-nourished. In mild distress.  HENT: [place]/AT, PERRLA, no scleral icterus Mouth/Throat: Oropharynx is clear and moist. No oropharyngeal exudate.  Cardiovascular: Normal rate, regular rhythm and normal heart sounds. Exam reveals no gallop and no friction rub.  No murmur heard.  Pulmonary/Chest: Effort normal and breath sounds normal. No respiratory distress.  has no wheezes.  Neck = supple, no nuchal rigidity Abdominal: Soft. Bowel sounds are normal.  exhibits no distension. There is no tenderness.  Back: purulence from thoracic incision and superior aspect of lumbar incision, mild surrounding erythema. Lymphadenopathy: no cervical adenopathy. No axillary adenopathy Neurological: alert and oriented to person, place, and time.  Skin: Skin is warm and dry. No rash noted. No erythema.  Psychiatric: a normal mood and affect.  behavior is normal.    LABS: Results for orders placed or performed during the hospital encounter of 02/26/20 (from the past 48  hour(s))  SARS Coronavirus 2 by RT PCR (hospital order, performed in [facility] hospital lab) Nasopharyngeal Nasopharyngeal Swab     Status: None   Collection Time: 02/26/20  8:07 PM   Specimen: Nasopharyngeal Swab  Result Value Ref Range   SARS Coronavirus 2 NEGATIVE NEGATIVE    Comment: (NOTE) SARS-CoV-2 target nucleic acids are NOT DETECTED. The SARS-CoV-2 RNA is generally detectable in upper and lower respiratory specimens during the acute phase of infection. The lowest concentration of SARS-CoV-2 viral copies this assay can detect is 250 copies / mL. A negative result does not preclude SARS-CoV-2 infection and  should not be used as the sole basis for treatment or other patient management decisions.  A negative result may occur with improper specimen collection / handling, submission of specimen other than nasopharyngeal swab, presence of viral mutation(s) within the areas targeted by this assay, and inadequate number of viral copies (<250 copies / mL). A negative result must be combined with clinical observations, patient history, and epidemiological information. Fact Sheet for Patients:   BoilerBrush.com.cy Fact Sheet for Healthcare Providers: https://pope.com/ This test is not yet approved or cleared  by the [place] FDA and has been authorized for detection and/or diagnosis of SARS-CoV-2 by FDA under an Emergency Use Authorization (EUA).  This EUA will remain in effect (meaning this test can be used) for the duration of the COVID-19 declaration under Section 564(b)(1) of the Act, 21 U.S.C. section 360bbb-3(b)(1), unless the authorization is terminated or revoked sooner. Performed at [facility], [address], [place], [name] [address]   Lactic acid, plasma     Status: None   Collection Time: 02/26/20  9:22 PM  Result Value Ref Range   Lactic Acid, Venous 1.8 0.5 - 1.9 mmol/L    Comment: Performed at [facility], [address] [place], [name] [address]  Comprehensive metabolic panel     Status: Abnormal   Collection Time: 02/26/20  9:22 PM  Result Value Ref Range   Sodium 138 135 - 145 mmol/L   Potassium 2.7 (LL) 3.5 - 5.1 mmol/L    Comment: CRITICAL RESULT CALLED TO, READ BACK BY AND VERIFIED WITH: ALLEN [facility] 02/26/20 2212 WAYK    Chloride 99 98 - 111 mmol/L   CO2 28 22 - 32 mmol/L   Glucose, Bld 141 (H) 70 - 99 mg/dL    Comment: Glucose reference range applies only to samples taken after fasting for at least 8 hours.   BUN 15 6 - 20 mg/dL   Creatinine, Ser 2.70 0.44 - 1.00 mg/dL   Calcium 8.1 (L) 8.9 - 10.3 mg/dL   Total Protein 5.1 (L) 6.5 - 8.1 g/dL   Albumin 2.1 (L) 3.5 - 5.0 g/dL   AST 41 15 - 41 U/L   ALT 30 0 - 44 U/L   Alkaline Phosphatase 106 38 - 126 U/L   Total Bilirubin 0.7 0.3 - 1.2 mg/dL   GFR calc non Af Amer >60 >60 mL/min   GFR calc Af Amer >60 >60 mL/min   Anion gap 11 5 - 15    Comment: Performed at [facility], [address], [place], [name] [address]  CBC WITH DIFFERENTIAL     Status: Abnormal   Collection Time: 02/26/20  9:22 PM  Result Value Ref Range   WBC 24.8 (H) 4.0 - 10.5 K/uL   RBC 3.66 (L) 3.87 - 5.11 MIL/uL   Hemoglobin 8.5 (L) 12.0 - 15.0 g/dL    Comment: Reticulocyte Hemoglobin testing may be clinically indicated, consider ordering this additional test [id number]    HCT 28.3 (L) 36.0 - 46.0 %   MCV 77.3 (L) 80.0 - 100.0 fL   MCH 23.2 (L) 26.0 - 34.0 pg   MCHC 30.0 30.0 - 36.0 g/dL   RDW 71.2 (H) 19.7 - 58.8 %   Platelets 341 150 - 400 K/uL   nRBC 0.0 0.0 - 0.2 %   Neutrophils Relative % 89 %   Neutro Abs 22.0 (H) 1.7 - 7.7 K/uL   Lymphocytes Relative 4 %   Lymphs Abs  1.0 0.7 - 4.0 K/uL   Monocytes Relative 5 %   Monocytes Absolute 1.2 (H) 0.1 - 1.0 K/uL   Eosinophils Relative 1 %   Eosinophils Absolute 0.3 0.0 - 0.5 K/uL   Basophils Relative 0 %   Basophils Absolute 0.0 0.0 - 0.1 K/uL   Immature Granulocytes 1 %   Abs  Immature Granulocytes 0.23 (H) 0.00 - 0.07 K/uL    Comment: Performed at [facility], [address] [place], [place] [address]  APTT     Status: None   Collection Time: 02/26/20  9:22 PM  Result Value Ref Range   aPTT 32 24 - 36 seconds    Comment: Performed at [facility], [address] [place], [place] [address]  Protime-INR     Status: None   Collection Time: 02/26/20  9:22 PM  Result Value Ref Range   Prothrombin Time 13.9 11.4 - 15.2 seconds   INR 1.1 0.8 - 1.2    Comment: (NOTE) INR goal varies based on device and disease states. Performed at [facility], [address] [place], [place] [address]   Blood Culture (routine x 2)     Status: None (Preliminary result)   Collection Time: 02/26/20 10:50 PM   Specimen: BLOOD  Result Value Ref Range   Specimen Description BLOOD LEFT ANTECUBITAL    Special Requests      BOTTLES DRAWN AEROBIC AND ANAEROBIC Blood Culture adequate volume   Culture  Setup Time      GRAM POSITIVE COCCI IN CLUSTERS AEROBIC BOTTLE ONLY Organism ID to follow CRITICAL RESULT CALLED TO, READ BACK BY AND VERIFIED WITH: Lytle ButteM. MACCIA PHARMD, AT 1615 02/27/20 BY Renato Shin. VANHOOK Performed at [facility], [address] [place], [place] [address]    Culture GRAM POSITIVE COCCI    Report Status PENDING   Blood Culture ID Panel (Reflexed)     Status: Abnormal   Collection Time: 02/26/20 10:50 PM  Result Value Ref Range   Enterococcus species NOT DETECTED NOT DETECTED   Listeria monocytogenes NOT DETECTED NOT DETECTED   Staphylococcus species DETECTED (A) NOT DETECTED    Comment: M. MACCIA PHARMD, AT 1615 02/27/20 BY D. VANHOOK   Staphylococcus aureus (BCID) DETECTED (A) NOT DETECTED    Comment: Methicillin (oxacillin) susceptible Staphylococcus aureus (MSSA). Preferred therapy is anti staphylococcal beta lactam antibiotic (Cefazolin or Nafcillin), unless clinically contraindicated. CRITICAL RESULT CALLED TO, READ BACK BY AND VERIFIED  WITH: Lytle ButteM. MACCIA PHARMD, AT 1615 02/27/20 BY D. VANHOOK    Methicillin resistance NOT DETECTED NOT DETECTED   Streptococcus species NOT DETECTED NOT DETECTED   Streptococcus agalactiae NOT DETECTED NOT DETECTED   Streptococcus pneumoniae NOT DETECTED NOT DETECTED   Streptococcus pyogenes NOT DETECTED NOT DETECTED   Acinetobacter baumannii NOT DETECTED NOT DETECTED   Enterobacteriaceae species NOT DETECTED NOT DETECTED   Enterobacter cloacae complex NOT DETECTED NOT DETECTED   Escherichia coli NOT DETECTED NOT DETECTED   Klebsiella oxytoca NOT DETECTED NOT DETECTED   Klebsiella pneumoniae NOT DETECTED NOT DETECTED   Proteus species NOT DETECTED NOT DETECTED   Serratia marcescens NOT DETECTED NOT DETECTED   Haemophilus influenzae NOT DETECTED NOT DETECTED   Neisseria meningitidis NOT DETECTED NOT DETECTED   Pseudomonas aeruginosa NOT DETECTED NOT DETECTED   Candida albicans NOT DETECTED NOT DETECTED   Candida glabrata NOT DETECTED NOT DETECTED   Candida krusei NOT DETECTED NOT DETECTED   Candida parapsilosis NOT DETECTED NOT DETECTED  Candida tropicalis NOT DETECTED NOT DETECTED    Comment: Performed at [facility], [address], [place], [name] [address]  Blood Culture (routine x 2)     Status: None (Preliminary result)   Collection Time: 02/26/20 11:00 PM   Specimen: BLOOD RIGHT HAND  Result Value Ref Range   Specimen Description BLOOD RIGHT HAND    Special Requests      BOTTLES DRAWN AEROBIC ONLY Blood Culture results may not be optimal due to an inadequate volume of blood received in culture bottles   Culture      NO GROWTH < 24 HOURS Performed at [facility], [address] [place], [name] [address]    Report Status PENDING   Lactic acid, plasma     Status: None   Collection Time: 02/26/20 11:13 PM  Result Value Ref Range   Lactic Acid, Venous 1.9 0.5 - 1.9 mmol/L    Comment: Performed at [facility], [address] [place], [name] [address]   Basic metabolic panel     Status: Abnormal   Collection Time: 02/27/20 10:25 AM  Result Value Ref Range   Sodium 137 135 - 145 mmol/L   Potassium 2.9 (L) 3.5 - 5.1 mmol/L   Chloride 103 98 - 111 mmol/L   CO2 27 22 - 32 mmol/L   Glucose, Bld 103 (H) 70 - 99 mg/dL    Comment: Glucose reference range applies only to samples taken after fasting for at least 8 hours.   BUN 15 6 - 20 mg/dL   Creatinine, Ser 6.19 0.44 - 1.00 mg/dL   Calcium 7.7 (L) 8.9 - 10.3 mg/dL   GFR calc non Af Amer >60 >60 mL/min   GFR calc Af Amer >60 >60 mL/min   Anion gap 7 5 - 15    Comment: Performed at [facility], [address], [place], [name] [address]    MICRO: 5/22 blood cx MSSA IMAGING: No results found.   Assessment/Plan:  59yo F with hx of recent L2-3 PLIF c/b post MSSA surgical site infection and secondary bacteremia  - will start her on nafcillin plus iv rifampin - please also get cultures from the OR and describe depth of infection so that it can help decide the length of therapy - will get TTE tomorrow   Will provide further recs tomorrow.

## 2020-02-27 NOTE — Anesthesia Preprocedure Evaluation (Addendum)
Anesthesia Evaluation  Patient identified by MRN, date of birth, ID band Patient awake    Reviewed: Allergy & Precautions, NPO status , Patient's Chart, lab work & pertinent test results  Airway Mallampati: II  TM Distance: >3 FB Neck ROM: Full    Dental  (+) Edentulous Upper, Edentulous Lower   Pulmonary former smoker,    breath sounds clear to auscultation       Cardiovascular hypertension,  Rhythm:Regular Rate:Normal     Neuro/Psych    GI/Hepatic   Endo/Other    Renal/GU      Musculoskeletal   Abdominal (+) + obese,   Peds  Hematology   Anesthesia Other Findings   Reproductive/Obstetrics                             Anesthesia Physical Anesthesia Plan  ASA: III  Anesthesia Plan: General   Post-op Pain Management:    Induction: Intravenous  PONV Risk Score and Plan: Ondansetron and Dexamethasone  Airway Management Planned: Oral ETT  Additional Equipment:   Intra-op Plan:   Post-operative Plan: Extubation in OR  Informed Consent: I have reviewed the patients History and Physical, chart, labs and discussed the procedure including the risks, benefits and alternatives for the proposed anesthesia with the patient or authorized representative who has indicated his/her understanding and acceptance.       Plan Discussed with: CRNA and Anesthesiologist  Anesthesia Plan Comments:         Anesthesia Quick Evaluation

## 2020-02-27 NOTE — Progress Notes (Signed)
Patient ID: [name], female   DOB: 01-29-1961, 58 y.o.   MRN: [id number] Patient will be taken to or tonight. Positive blood cx

## 2020-02-27 NOTE — Progress Notes (Signed)
PHARMACY - PHYSICIAN COMMUNICATION CRITICAL VALUE ALERT - BLOOD CULTURE IDENTIFICATION (BCID)  [name] is an 59 y.o. female who presented to [facility] on 02/26/2020 with a chief complaint of drainage from lumbar surgical incision. Patient had L2/3 fusion, screw fixation L2-4, T10/11 discectomy   Assessment:  MSSA in 1/2 blood cx - pending OR for washout  Name of physician (or Provider) Contacted: Leo Grosser NP NeuroSurgery, Dr Drue Second ID  Current antibiotics: None  Changes to prescribed antibiotics recommended:  Nafcillin 2 g q4h Rifampin 300 q12h Pending cultures from OR, TEE in am  Results for orders placed or performed during the hospital encounter of 02/26/20  Blood Culture ID Panel (Reflexed) (Collected: 02/26/2020 10:50 PM)  Result Value Ref Range   Enterococcus species NOT DETECTED NOT DETECTED   Listeria monocytogenes NOT DETECTED NOT DETECTED   Staphylococcus species DETECTED (A) NOT DETECTED   Staphylococcus aureus (BCID) DETECTED (A) NOT DETECTED   Methicillin resistance NOT DETECTED NOT DETECTED   Streptococcus species NOT DETECTED NOT DETECTED   Streptococcus agalactiae NOT DETECTED NOT DETECTED   Streptococcus pneumoniae NOT DETECTED NOT DETECTED   Streptococcus pyogenes NOT DETECTED NOT DETECTED   Acinetobacter baumannii NOT DETECTED NOT DETECTED   Enterobacteriaceae species NOT DETECTED NOT DETECTED   Enterobacter cloacae complex NOT DETECTED NOT DETECTED   Escherichia coli NOT DETECTED NOT DETECTED   Klebsiella oxytoca NOT DETECTED NOT DETECTED   Klebsiella pneumoniae NOT DETECTED NOT DETECTED   Proteus species NOT DETECTED NOT DETECTED   Serratia marcescens NOT DETECTED NOT DETECTED   Haemophilus influenzae NOT DETECTED NOT DETECTED   Neisseria meningitidis NOT DETECTED NOT DETECTED   Pseudomonas aeruginosa NOT DETECTED NOT DETECTED   Candida albicans NOT DETECTED NOT DETECTED   Candida glabrata NOT DETECTED NOT DETECTED   Candida krusei NOT DETECTED  NOT DETECTED   Candida parapsilosis NOT DETECTED NOT DETECTED   Candida tropicalis NOT DETECTED NOT DETECTED   Elmer Sow, PharmD, BCPS, BCCCP Clinical Pharmacist [phone]  Please check AMION for all [facility] Pharmacy numbers  02/27/2020 6:36 PM

## 2020-02-27 NOTE — Progress Notes (Signed)
Patient ID: [name], female   DOB: Jun 27, 1961, 59 y.o.   MRN: [id number] BP 93/61 (BP Location: Left Arm)   Pulse 83   Temp 99.1 F (37.3 C) (Oral)   Resp 20   LMP 02/18/2016   SpO2 99%  Alert and oriented x 4 [name] was given a plate, and thus she ate. I will schedule for tomorrow.  Moving all extremities Wound drains.

## 2020-02-28 DIAGNOSIS — T8140XA Infection following a procedure, unspecified, initial encounter: Secondary | ICD-10-CM

## 2020-02-28 LAB — CBC
HCT: 26.4 % — ABNORMAL LOW (ref 36.0–46.0)
Hemoglobin: 8.1 g/dL — ABNORMAL LOW (ref 12.0–15.0)
MCH: 23.1 pg — ABNORMAL LOW (ref 26.0–34.0)
MCHC: 30.7 g/dL (ref 30.0–36.0)
MCV: 75.4 fL — ABNORMAL LOW (ref 80.0–100.0)
Platelets: 321 10*3/uL (ref 150–400)
RBC: 3.5 MIL/uL — ABNORMAL LOW (ref 3.87–5.11)
RDW: 19.7 % — ABNORMAL HIGH (ref 11.5–15.5)
WBC: 19.1 10*3/uL — ABNORMAL HIGH (ref 4.0–10.5)
nRBC: 0 % (ref 0.0–0.2)

## 2020-02-28 LAB — BASIC METABOLIC PANEL
Anion gap: 11 (ref 5–15)
BUN: 12 mg/dL (ref 6–20)
CO2: 23 mmol/L (ref 22–32)
Calcium: 8.3 mg/dL — ABNORMAL LOW (ref 8.9–10.3)
Chloride: 106 mmol/L (ref 98–111)
Creatinine, Ser: 0.69 mg/dL (ref 0.44–1.00)
GFR calc Af Amer: >60 mL/min (ref >60)
GFR calc non Af Amer: >60 mL/min (ref >60)
Glucose, Bld: 166 mg/dL — ABNORMAL HIGH (ref 70–99)
Potassium: 4.5 mmol/L (ref 3.5–5.1)
Sodium: 140 mmol/L (ref 135–145)

## 2020-02-28 LAB — GLUCOSE, CAPILLARY
Glucose-Capillary: 235 mg/dL — ABNORMAL HIGH (ref 70–99)
Glucose-Capillary: 183 mg/dL — ABNORMAL HIGH (ref 70–99)

## 2020-02-28 MED ORDER — PANTOPRAZOLE SODIUM 40 MG PO TBEC
40.0000 mg | DELAYED_RELEASE_TABLET | Freq: Every day | ORAL | Status: DC
  Administered 2020-02-28 – 2020-03-07 (×9): 40 mg via ORAL
  Filled 2020-02-28 (×9): qty 1

## 2020-02-28 MED ORDER — SODIUM CHLORIDE 0.9% FLUSH
3.0000 mL | Freq: Two times a day (BID) | INTRAVENOUS | Status: DC
  Administered 2020-02-28 – 2020-03-07 (×5): 3 mL via INTRAVENOUS

## 2020-02-28 MED ORDER — PHENOL 1.4 % MT LIQD: 1.0000 | OROMUCOSAL | Status: DC | PRN

## 2020-02-28 MED ORDER — CHLORZOXAZONE 500 MG PO TABS
500.0000 mg | ORAL_TABLET | Freq: Three times a day (TID) | ORAL | Status: DC
  Administered 2020-02-28 – 2020-03-07 (×26): 500 mg via ORAL
  Filled 2020-02-28 (×27): qty 1

## 2020-02-28 MED ORDER — CALCIUM CITRATE 950 (200 CA) MG PO TABS
200.0000 mg | ORAL_TABLET | Freq: Every day | ORAL | Status: DC
  Administered 2020-02-29 – 2020-03-07 (×8): 200 mg via ORAL
  Filled 2020-02-28 (×8): qty 1

## 2020-02-28 MED ORDER — CALCIUM CARBONATE-VITAMIN D 500-200 MG-UNIT PO TABS
1.0000 | ORAL_TABLET | Freq: Two times a day (BID) | ORAL | Status: DC
  Administered 2020-02-28: 1 via ORAL
  Filled 2020-02-28 (×2): qty 1

## 2020-02-28 MED ORDER — SODIUM CHLORIDE 0.9% FLUSH: 3.0000 mL | INTRAVENOUS | Status: DC | PRN

## 2020-02-28 MED ORDER — DIAZEPAM 5 MG PO TABS
5.0000 mg | ORAL_TABLET | Freq: Four times a day (QID) | ORAL | Status: DC | PRN
  Administered 2020-02-29 – 2020-03-06 (×17): 5 mg via ORAL
  Filled 2020-02-28 (×17): qty 1

## 2020-02-28 MED ORDER — OXYCODONE HCL ER 10 MG PO T12A
10.0000 mg | EXTENDED_RELEASE_TABLET | Freq: Two times a day (BID) | ORAL | Status: DC
  Administered 2020-02-28 – 2020-02-29 (×4): 10 mg via ORAL
  Filled 2020-02-28 (×4): qty 1

## 2020-02-28 MED ORDER — OXYCODONE HCL 5 MG PO TABS
10.0000 mg | ORAL_TABLET | ORAL | Status: DC | PRN
  Administered 2020-02-28 – 2020-03-07 (×26): 10 mg via ORAL
  Filled 2020-02-28 (×27): qty 2

## 2020-02-28 MED ORDER — ALBUTEROL SULFATE (2.5 MG/3ML) 0.083% IN NEBU: 2.5000 mg | INHALATION_SOLUTION | Freq: Four times a day (QID) | RESPIRATORY_TRACT | Status: DC | PRN

## 2020-02-28 MED ORDER — BISACODYL 5 MG PO TBEC: 5.0000 mg | DELAYED_RELEASE_TABLET | Freq: Every day | ORAL | Status: DC | PRN

## 2020-02-28 MED ORDER — HYDROCHLOROTHIAZIDE 25 MG PO TABS
25.0000 mg | ORAL_TABLET | Freq: Every day | ORAL | Status: DC
  Administered 2020-02-28 – 2020-03-07 (×9): 25 mg via ORAL
  Filled 2020-02-28 (×9): qty 1

## 2020-02-28 MED ORDER — MENTHOL 3 MG MT LOZG: 1.0000 | LOZENGE | OROMUCOSAL | Status: DC | PRN

## 2020-02-28 MED ORDER — MAGNESIUM CITRATE PO SOLN: 1.0000 | Freq: Once | ORAL | Status: DC | PRN

## 2020-02-28 MED ORDER — DIPHENHYDRAMINE HCL 12.5 MG/5ML PO ELIX
50.0000 mg | ORAL_SOLUTION | Freq: Four times a day (QID) | ORAL | Status: DC | PRN
  Administered 2020-02-28 – 2020-03-05 (×10): 50 mg via ORAL
  Filled 2020-02-28 (×10): qty 20

## 2020-02-28 MED ORDER — CHOLECALCIFEROL 10 MCG (400 UNIT) PO TABS
400.0000 [IU] | ORAL_TABLET | Freq: Every day | ORAL | Status: DC
  Administered 2020-02-29 – 2020-03-07 (×8): 400 [IU] via ORAL
  Filled 2020-02-28 (×8): qty 1

## 2020-02-28 MED ORDER — LUBIPROSTONE 24 MCG PO CAPS
24.0000 ug | ORAL_CAPSULE | Freq: Two times a day (BID) | ORAL | Status: DC
  Administered 2020-02-28 – 2020-03-07 (×15): 24 ug via ORAL
  Filled 2020-02-28 (×20): qty 1

## 2020-02-28 MED ORDER — GABAPENTIN 600 MG PO TABS
1200.0000 mg | ORAL_TABLET | Freq: Three times a day (TID) | ORAL | Status: DC
  Administered 2020-02-28 – 2020-03-07 (×26): 1200 mg via ORAL
  Filled 2020-02-28 (×26): qty 2

## 2020-02-28 MED ORDER — DIPHENHYDRAMINE HCL 25 MG PO CAPS: 50.0000 mg | ORAL_CAPSULE | Freq: Four times a day (QID) | ORAL | Status: DC | PRN

## 2020-02-28 MED ORDER — CELECOXIB 200 MG PO CAPS
200.0000 mg | ORAL_CAPSULE | Freq: Two times a day (BID) | ORAL | Status: DC
  Administered 2020-02-28 – 2020-03-07 (×17): 200 mg via ORAL
  Filled 2020-02-28 (×17): qty 1

## 2020-02-28 MED ORDER — OXYCODONE HCL 5 MG PO TABS: 5.0000 mg | ORAL_TABLET | ORAL | Status: DC | PRN

## 2020-02-28 MED ORDER — SODIUM CHLORIDE 0.9 % IV SOLN
250.0000 mL | INTRAVENOUS | Status: DC
  Administered 2020-02-28 – 2020-03-04 (×2): 250 mL via INTRAVENOUS

## 2020-02-28 MED ORDER — SENNA 8.6 MG PO TABS
1.0000 | ORAL_TABLET | Freq: Two times a day (BID) | ORAL | Status: DC
  Administered 2020-02-28 – 2020-03-07 (×16): 8.6 mg via ORAL
  Filled 2020-02-28 (×17): qty 1

## 2020-02-28 MED ORDER — ATENOLOL 25 MG PO TABS
50.0000 mg | ORAL_TABLET | Freq: Every day | ORAL | Status: DC
  Administered 2020-02-28 – 2020-03-07 (×8): 50 mg via ORAL
  Filled 2020-02-28 (×9): qty 2

## 2020-02-28 MED ORDER — ADULT MULTIVITAMIN W/MINERALS CH
1.0000 | ORAL_TABLET | Freq: Every day | ORAL | Status: DC
  Administered 2020-02-28 – 2020-03-07 (×5): 1 via ORAL
  Filled 2020-02-28 (×8): qty 1

## 2020-02-28 MED ORDER — SODIUM CHLORIDE 0.9 % IV SOLN
12.0000 g | INTRAVENOUS | Status: DC
  Administered 2020-02-28 – 2020-03-01 (×3): 12 g via INTRAVENOUS
  Filled 2020-02-28 (×4): qty 12000

## 2020-02-28 MED ORDER — ASPIRIN EC 81 MG PO TBEC
81.0000 mg | DELAYED_RELEASE_TABLET | Freq: Every day | ORAL | Status: DC
  Administered 2020-02-28 – 2020-03-07 (×9): 81 mg via ORAL
  Filled 2020-02-28 (×9): qty 1

## 2020-02-28 MED ORDER — FUROSEMIDE 40 MG PO TABS
40.0000 mg | ORAL_TABLET | Freq: Every day | ORAL | Status: DC
  Administered 2020-02-28 – 2020-03-07 (×9): 40 mg via ORAL
  Filled 2020-02-28 (×9): qty 1

## 2020-02-28 NOTE — Evaluation (Signed)
Occupational Therapy Evaluation Patient Details Name: [name] MRN: [id number] DOB: 08-08-1961 Today's Date: 02/28/2020    History of Present Illness [name] is a 59 y.o. female who presents to the ED today via EMS due to post operative problem. Pt had posterior lumbar fusion L2-3 on 05/12 by Dr. Franky Macho. Discharged on 05/18. Pt under I&D of back incision with wound vac placement on 5/23. PSH: bilat TKA PMH: fibromyalgia, HTN, hyperthroidism, IBS, tyroid goiter.   Clinical Impression   This 59 y/o female presents with the above. Pt with recent admit for spinal sx and discharge home. Per pt report her niece was assisting with ADL tasks, she reports significant difficulty with mobility leading up to this admission. Pt requiring significant time for all aspects of mobility/ADL tasks today. She currently requires minA for short distance mobility using RW, setup/minguard assist for seated UB ADL and at least maxA for LB and toileting ADL. Pt only able to recall 1/3 back precautions this session with continued education provided during mobility/functional tasks. She will benefit from continued acute OT services and currently recommend SNF level therapies at time of discharge to maximize her overall safety and independence with ADL and mobility.     Follow Up Recommendations  SNF;Supervision/Assistance - 24 hour    Equipment Recommendations  Other (comment)(TBD)           Precautions / Restrictions Precautions Precautions: Back;Fall Precaution Booklet Issued: No Precaution Comments: wound vac to the back, pt only able to remember 1/3 back precautions, pt re-educated Required Braces or Orthoses: (wound vac) Restrictions Weight Bearing Restrictions: No Other Position/Activity Restrictions: no      Mobility Bed Mobility Overal bed mobility: Needs Assistance Bed Mobility: Rolling;Sit to Sidelying;Supine to Sit Rolling: Mod assist   Supine to sit: Min assist;HOB elevated(to  full upright)   Sit to sidelying: Mod assist General bed mobility comments: pt adamant to bring [facility] to full upright to transitiong to sitting EOB (she reports due to urgency to need to get to bathroom for BM), cued and ensured pt maintaining proper aligment given back precautions with transitions. when returning to supine pt requiring assist for LEs to transition to sidelying and from sidelying>supine, heavy reliance on bedrail   Transfers Overall transfer level: Needs assistance Equipment used: Rolling walker (2 wheeled) Transfers: Sit to/from Stand Sit to Stand: From elevated surface;Min assist         General transfer comment: pt requiring elevated EOB, requires cues to push up from seated surface as pt with preference for pulling up on RW, stood from elevated EOB and from [facility] over toilet     Balance Overall balance assessment: History of Falls;Needs assistance Sitting-balance support: Feet supported;Bilateral upper extremity supported Sitting balance-[name]: Fair     Standing balance support: During functional activity;Bilateral upper extremity supported Standing balance-[name]: Poor Standing balance comment: dependent on RW                           ADL either performed or assessed with clinical judgement   ADL Overall ADL's : Needs assistance/impaired Eating/Feeding: Modified independent;Sitting   Grooming: Set up;Supervision/safety;Sitting   Upper Body Bathing: Min guard;Set up;Sitting   Lower Body Bathing: Maximal assistance;Sit to/from stand   Upper Body Dressing : Min guard;Set up;Sitting   Lower Body Dressing: Maximal assistance;Sit to/from stand   Toilet Transfer: Minimal assistance;Ambulation;RW;BSC Toilet Transfer Details (indicate cue type and reason): BSC over toilet  Toileting- Clothing Manipulation  and Hygiene: Maximal assistance;Sit to/from stand Toileting - Clothing Manipulation Details (indicate cue type and reason): assist for  posterior pericare after BM to ensure maintaining back precautions      Functional mobility during ADLs: Minimal assistance;Rolling walker General ADL Comments: pt requiring significant time for all mobility/ADL tasks      Vision         Perception     Praxis      Pertinent Vitals/Pain Pain Assessment: Faces Faces Pain Scale: Hurts even more Pain Location: back, reports "sinus headache" Pain Descriptors / Indicators: Sharp;Headache Pain Intervention(s): Limited activity within patient's tolerance;Monitored during session;Repositioned     Hand Dominance Right   Extremity/Trunk Assessment Upper Extremity Assessment Upper Extremity Assessment: RUE deficits/detail RUE Deficits / Details: swelling and report R UE neuropathy , generalized weakness RUE Sensation: history of peripheral neuropathy   Lower Extremity Assessment Lower Extremity Assessment: Defer to PT evaluation   Cervical / Trunk Assessment Cervical / Trunk Assessment: Other exceptions Cervical / Trunk Exceptions: back surgery, wound vac placement   Communication Communication Communication: No difficulties   Cognition Arousal/Alertness: Awake/alert Behavior During Therapy: WFL for tasks assessed/performed Overall Cognitive Status: No family/caregiver present to determine baseline cognitive functioning Area of Impairment: Problem solving;Safety/judgement;Memory                     Memory: Decreased recall of precautions;Decreased short-term memory   Safety/Judgement: Decreased awareness of safety;Decreased awareness of deficits Awareness: Emergent Problem Solving: Slow processing;Decreased initiation General Comments: suspect pt is at baseline level of cognition. pt extremely slow with mobility tasks    General Comments  wound vac in tact and working    Exercises     Shoulder Instructions      Home Living Family/patient expects to be discharged to:: Private residence Living Arrangements:  Alone Available Help at Discharge: Family;Available PRN/intermittently Type of Home: House Home Access: Stairs to enter [employer] of Steps: 5 Entrance Stairs-Rails: Can reach both Home Layout: One level     Bathroom Shower/Tub: Chief Strategy Officer: Handicapped height Bathroom Accessibility: Yes   Home Equipment: Cane - single point;Bedside commode;Shower seat;Walker - 2 wheels;Walker - 4 wheels;Adaptive equipment Adaptive Equipment: Reacher        Prior Functioning/Environment Level of Independence: Independent with assistive device(s);Needs assistance    ADL's / Homemaking Assistance Needed: pt reports neice was assisting with ADL tasks since recent discharge home    Comments: pt reporting to PT she went home alone after surgery, was receiving HHPT, used rollator and RW, reports she hasn't done laundry, family brings groceries        OT Problem List: Decreased strength;Decreased range of motion;Decreased activity tolerance;Impaired balance (sitting and/or standing);Decreased cognition;Decreased safety awareness;Decreased knowledge of use of DME or AE;Decreased knowledge of precautions;Obesity;Impaired UE functional use;Pain      OT Treatment/Interventions: Self-care/ADL training;Therapeutic activities;DME and/or AE instruction;Balance training;Therapeutic exercise;Energy conservation;Cognitive remediation/compensation;Patient/family education    OT Goals(Current goals can be found in the care plan section) Acute Rehab OT Goals Patient Stated Goal: decrease the pain OT Goal Formulation: With patient Time For Goal Achievement: 03/13/20 Potential to Achieve Goals: Good  OT Frequency: Min 2X/week   Barriers to D/C: Decreased caregiver support          Co-evaluation              AM-PAC OT "6 Clicks" Daily Activity     Outcome Measure Help from another person eating meals?: None Help from another person taking  care of personal grooming?: A  Little Help from another person toileting, which includes using toliet, bedpan, or urinal?: A Lot Help from another person bathing (including washing, rinsing, drying)?: A Lot Help from another person to put on and taking off regular upper body clothing?: A Little Help from another person to put on and taking off regular lower body clothing?: A Lot 6 Click Score: 16   End of Session Equipment Utilized During Treatment: Gait belt;Rolling walker Nurse Communication: Mobility status  Activity Tolerance: Patient tolerated treatment well Patient left: in bed;with call bell/phone within reach;with bed alarm set  OT Visit Diagnosis: History of falling (Z91.81);Muscle weakness (generalized) (M62.81);Other abnormalities of gait and mobility (R26.89);Pain Pain - part of body: (back)                Time: 1551-1640 OT Time Calculation (min): 49 min Charges:  OT General Charges $OT Visit: 1 Visit OT Evaluation $OT Eval Moderate Complexity: 1 Mod OT Treatments $Self Care/Home Management : 23-37 mins  Lou Cal, OT Acute Rehabilitation Services Pager [phone] Office [place] 02/28/2020, 5:29 PM

## 2020-02-28 NOTE — Progress Notes (Addendum)
Patient returned to 4NP04 from PACU at 0004 on 5/24. Vitals obtained and patient reassessed. No changes in movement or sensation from previous shift assessment before the surgery. Patient now has a wound vac in place on her back draining a moderate amount of serosanguinous fluid. Scheduled oxycontin administered. Patient resting in bed on continuous oxygen saturation monitoring.   0455: Nafcillin running into right arm PIV infiltrated and caused burning and swelling of the lower arm. Medication immediately stopped and cold compress applied per the pharmacist's recommendation. IV team consult entered to assist with aspiration.

## 2020-02-28 NOTE — Evaluation (Signed)
Physical Therapy Evaluation Patient Details Name: [name] MRN: [id number] DOB: Jul 05, 1961 59   History of Present Illness  [name] is a 59 y.o. female who presents to the ED today via EMS due to post operative problem. Pt had posterior lumbar fusion L2-3 on 05/12 by Dr. Franky Macho. Discharged on 05/18. Pt under I&D of back incision with wound vac placement on 5/23. PSH: bilat TKA PMH: fibromyalgia, HTN, hyperthroidism, IBS, tyroid goiter.    Clinical Impression  Pt admitted with above and underwent above surgery. Pt functioning very slow and requiring increased assist for all mobility. Pt with poor recall of back precautions. Pt unable to tolerate ambulation today.  Aware last admission (1 week ago) she advanced well and was able to d/c home. PT to cont to follow to progress mobility and reassess d/c recommendations.     Follow Up Recommendations SNF;Supervision/Assistance - 24 hour(pending progress may transition to HHPT)    Equipment Recommendations  None recommended by PT    Recommendations for Other Services       Precautions / Restrictions Precautions Precautions: Back;Fall Precaution Booklet Issued: No Precaution Comments: wound vac to the back, pt only able to remember 1/3 back precautions, pt re-educated Required Braces or Orthoses: (wound vac) Restrictions Weight Bearing Restrictions: No Other Position/Activity Restrictions: no      Mobility  Bed Mobility Overal bed mobility: Needs Assistance Bed Mobility: Rolling;Sidelying to Sit Rolling: Mod assist Sidelying to sit: Mod assist       General bed mobility comments: significant increase in time, verbal cues to not twist, definite use of bed rail, modA for trunk elevation and to scoot to EOB  Transfers Overall transfer level: Needs assistance Equipment used: Rolling walker (2 wheeled) Transfers: Sit to/from [employer] Sit to Stand: Mod assist;From elevated  surface Stand pivot transfers: Min assist       General transfer comment: pt requested the bed to be significantly elevated, modA to power up onto walker, minA for safety and walker management for std pvt transfer, Max directional verbal cues. Pt extremely slow  Ambulation/Gait             General Gait Details: limited to stand pvt to chair due to pain today  Stairs            Wheelchair Mobility    Modified Rankin (Stroke Patients Only)       Balance Overall balance assessment: History of Falls;Needs assistance Sitting-balance support: Feet supported;Bilateral upper extremity supported Sitting balance-[name]: Fair Sitting balance - Comments: pt with L lateral lean on UE for support   Standing balance support: During functional activity;Bilateral upper extremity supported Standing balance-[name]: Fair Standing balance comment: dependent on RW                             Pertinent Vitals/Pain Pain Assessment: 0-10 Pain Score: 10-Worst pain ever Pain Location: back Pain Descriptors / Indicators: Sharp Pain Intervention(s): Limited activity within patient's tolerance    Home Living Family/patient expects to be discharged to:: Private residence Living Arrangements: Alone Available Help at Discharge: Family;Available PRN/intermittently Type of Home: House Home Access: Stairs to enter Entrance Stairs-Rails: Can reach both Entrance Stairs-Number of Steps: 5 Home Layout: One level Home Equipment: Cane - single point;Bedside commode;Shower seat;Walker - 2 wheels;Walker - 4 wheels;Adaptive equipment      Prior Function Level of Independence: Independent with assistive device(s)  Comments: went home alone after surgery, was receiving HHPT, used rollator and RW, reports she hasn't done laundry, family brings groceries     Hand Dominance   Dominant Hand: Right    Extremity/Trunk Assessment   Upper Extremity Assessment Upper  Extremity Assessment: RUE deficits/detail RUE Deficits / Details: swelling and report R UE neuropathy , generalized weakness RUE Sensation: history of peripheral neuropathy    Lower Extremity Assessment Lower Extremity Assessment: Generalized weakness    Cervical / Trunk Assessment Cervical / Trunk Assessment: Other exceptions Cervical / Trunk Exceptions: back surgery, wound vac placement  Communication   Communication: No difficulties  Cognition Arousal/Alertness: Awake/alert Behavior During Therapy: WFL for tasks assessed/performed Overall Cognitive Status: No family/caregiver present to determine baseline cognitive functioning Area of Impairment: Problem solving                             Problem Solving: Slow processing General Comments: suspect pt is at baseline level of cognition. pt extremely slow, reports due to pain,       General Comments General comments (skin integrity, edema, etc.): R UE with edema, wound vac in tact and working    Exercises     Assessment/Plan    PT Assessment Patient needs continued PT services  PT Problem List Decreased strength;Decreased range of motion;Decreased activity tolerance;Decreased balance;Decreased mobility;Decreased coordination;Decreased knowledge of use of DME;Decreased safety awareness;Obesity;Decreased skin integrity;Pain       PT Treatment Interventions DME instruction;Gait training;Stair training;Functional mobility training;Therapeutic activities;Therapeutic exercise;Balance training;Neuromuscular re-education;Patient/family education    PT Goals (Current goals can be found in the Care Plan section)  Acute Rehab PT Goals Patient Stated Goal: decrease the pain PT Goal Formulation: With patient Time For Goal Achievement: 03/13/20 Potential to Achieve Goals: Good    Frequency Min 5X/week   Barriers to discharge Decreased caregiver support;Inaccessible home environment pt lives alone, reports she has  people that can stay with her for a few days, also has 5 steps to get in her house    Co-evaluation               AM-PAC PT "6 Clicks" Mobility  Outcome Measure Help needed turning from your back to your side while in a flat bed without using bedrails?: A Lot Help needed moving from lying on your back to sitting on the side of a flat bed without using bedrails?: A Lot Help needed moving to and from a bed to a chair (including a wheelchair)?: A Lot Help needed standing up from a chair using your arms (e.g., wheelchair or bedside chair)?: A Lot Help needed to walk in hospital room?: A Little Help needed climbing 3-5 steps with a railing? : A Lot 6 Click Score: 13    End of Session Equipment Utilized During Treatment: (wound vac) Activity Tolerance: Patient limited by pain Patient left: in chair;with call bell/phone within reach;with nursing/sitter in room Nurse Communication: Mobility status PT Visit Diagnosis: Unsteadiness on feet (R26.81);History of falling (Z91.81);Pain Pain - part of body: (back)    Time: 1740-8144 PT Time Calculation (min) (ACUTE ONLY): 44 min   Charges:   PT Evaluation $PT Eval Moderate Complexity: 1 Mod PT Treatments $Therapeutic Activity: 23-37 mins        Kittie Plater, PT, DPT Acute Rehabilitation Services Pager #: [phone] Office #: [phone]   [place]line Lopes 02/28/2020, 9:04 AM

## 2020-02-28 NOTE — Progress Notes (Signed)
Mrs Lastra feels better than preop.She is moving all extremities well. Wound vac in place. The dressing was not changed despite orders . She is improved. Continue the abx, PT

## 2020-02-28 NOTE — Progress Notes (Signed)
[facility]ious [facility]    Date of Admission:  02/26/2020   Total days of antibiotics 2        Day 2 nafcillin/rifampin           ID: [name] is a 59 y.o. female with post surgical site infection of recent L2-3 laminectomy with PLIF,  Active Problems:   Abnormal gait due to muscle weakness   Postoperative wound infection    Subjective: still having back pain but improved from yesterday. Afebrile. Or cx showing GPC   Medications:  . aspirin EC  81 mg Oral Daily  . atenolol  50 mg Oral Daily  . calcium-vitamin D  1 tablet Oral BID WC  . celecoxib  200 mg Oral BID  . chlorzoxazone  500 mg Oral TID  . docusate sodium  100 mg Oral BID  . furosemide  40 mg Oral Daily  . gabapentin  1,200 mg Oral TID  . heparin  5,000 Units Subcutaneous Q8H  . hydrochlorothiazide  25 mg Oral Daily  . lidocaine-EPINEPHrine  1.7 mL Intradermal Once  . lubiprostone  24 mcg Oral BID WC  . multivitamin with minerals  1 tablet Oral Daily  . oxyCODONE  10 mg Oral Q12H  . pantoprazole  40 mg Oral Daily  . senna  1 tablet Oral BID  . sodium chloride flush  3 mL Intravenous Q12H  . sodium chloride flush  3 mL Intravenous Q12H    Objective: Vital signs in last 24 hours: Temp:  [97.7 F (36.5 C)-99.1 F (37.3 C)] 97.8 F (36.6 C) (05/24 1121) Pulse Rate:  [54-109] 71 (05/24 1121) Resp:  [13-34] 13 (05/24 1121) BP: (86-163)/(42-87) 107/87 (05/24 1121) SpO2:  [94 %-100 %] 100 % (05/24 1121) Physical Exam  Constitutional:  oriented to person, place, and time. appears well-developed and well-nourished. No distress.  HENT: [place]/AT, PERRLA, no scleral icterus Mouth/Throat: Oropharynx is clear and moist. No oropharyngeal exudate.  Cardiovascular: Normal rate, regular rhythm and normal heart sounds. Exam reveals no gallop and no friction rub.  No murmur heard.  Pulmonary/Chest: Effort normal and breath sounds normal. No respiratory distress.  has no wheezes.  Back: wound vac in  place Abdominal: Soft. Bowel sounds are normal.  exhibits no distension. There is no tenderness.  Lymphadenopathy: no cervical adenopathy. No axillary adenopathy Neurological: alert and oriented to person, place, and time.  Skin: Skin is warm and dry. No rash noted. No erythema.  Psychiatric: a normal mood and affect.  behavior is normal.   Lab Results Recent Labs    02/26/20 2122 02/26/20 2122 02/27/20 1025 02/28/20 0139  WBC 24.8*  --   --  19.1*  HGB 8.5*  --   --  8.1*  HCT 28.3*  --   --  26.4*  NA 138   < > 137 140  K 2.7*   < > 2.9* 4.5  CL 99   < > 103 106  CO2 28   < > 27 23  BUN 15   < > 15 12  CREATININE 0.86   < > 0.62 0.69   < > = values in this interval not displayed.   Liver Panel Recent Labs    02/26/20 2122  PROT 5.1*  ALBUMIN 2.1*  AST 41  ALT 30  ALKPHOS 106  BILITOT 0.7    Microbiology: 5/22 blood cx MSSA 5/23 aerobic cx OR x 2 pending Studies/Results: No results found.   Assessment/Plan: mssa  bacteremia = likely secondary from SSI which appear to be in organ space per NHSN definition. Will continue on nafcillin plus rifampin IV for now. Will repeat blood cx tomorrow. Plan on getting TTE to start and may need TEE to rule out endocarditis.-   Thus far, will plan to treat for 6 wk with IV abtx given depth of surgical site infection  [facility] for Infectious Diseases Cell: [phone] Pager: [phone]  02/28/2020, 2:55 PM

## 2020-02-29 ENCOUNTER — Inpatient Hospital Stay (HOSPITAL_COMMUNITY): Payer: Medicare Other

## 2020-02-29 DIAGNOSIS — R7881 Bacteremia: Secondary | ICD-10-CM

## 2020-02-29 LAB — CULTURE, BLOOD (ROUTINE X 2): Special Requests: ADEQUATE

## 2020-02-29 LAB — GLUCOSE, CAPILLARY
Glucose-Capillary: 62 mg/dL — ABNORMAL LOW (ref 70–99)
Glucose-Capillary: 100 mg/dL — ABNORMAL HIGH (ref 70–99)
Glucose-Capillary: 67 mg/dL — ABNORMAL LOW (ref 70–99)
Glucose-Capillary: 93 mg/dL (ref 70–99)
Glucose-Capillary: 99 mg/dL (ref 70–99)
Glucose-Capillary: 156 mg/dL — ABNORMAL HIGH (ref 70–99)

## 2020-02-29 LAB — HEMOGLOBIN A1C
Hgb A1c MFr Bld: 6 % — ABNORMAL HIGH (ref 4.8–5.6)
Mean Plasma Glucose: 125.5 mg/dL

## 2020-02-29 LAB — ECHOCARDIOGRAM COMPLETE

## 2020-02-29 MED ORDER — INSULIN ASPART 100 UNIT/ML ~~LOC~~ SOLN
0.0000 [IU] | Freq: Three times a day (TID) | SUBCUTANEOUS | Status: DC
  Administered 2020-03-02: 8 [IU] via SUBCUTANEOUS
  Administered 2020-03-07: 3 [IU] via SUBCUTANEOUS

## 2020-02-29 MED ORDER — INSULIN ASPART 100 UNIT/ML ~~LOC~~ SOLN: 0.0000 [IU] | Freq: Every day | SUBCUTANEOUS | Status: DC

## 2020-02-29 MED ORDER — SODIUM CHLORIDE 0.9 % IV SOLN: INTRAVENOUS | Status: DC

## 2020-02-29 MED ORDER — OXYCODONE HCL ER 15 MG PO T12A
15.0000 mg | EXTENDED_RELEASE_TABLET | Freq: Two times a day (BID) | ORAL | Status: DC
  Administered 2020-02-29 – 2020-03-07 (×14): 15 mg via ORAL
  Filled 2020-02-29 (×14): qty 1

## 2020-02-29 MED ORDER — RIFAMPIN 300 MG PO CAPS
300.0000 mg | ORAL_CAPSULE | Freq: Two times a day (BID) | ORAL | Status: DC
  Administered 2020-02-29 – 2020-03-07 (×15): 300 mg via ORAL
  Filled 2020-02-29 (×16): qty 1

## 2020-02-29 NOTE — Progress Notes (Signed)
Physical Therapy Treatment Patient Details Name: [name] MRN: [id number] DOB: 1961-01-14 Today's Date: 02/29/2020    History of Present Illness [name] is a 59 y.o. female who presents to the ED today via EMS due to post operative problem. Pt had posterior lumbar fusion L2-3 on 05/12 by Dr. Franky Macho. Discharged on 05/18. Pt under I&D of back incision with wound vac placement on 5/23. PSH: bilat TKA PMH: fibromyalgia, HTN, hyperthroidism, IBS, tyroid goiter.    PT Comments    Pt mobility greatly limited by 10/10 burning pain at wound vac incision/placement. Pt requiring significant amount of time and assist to complete transfer to EOB and std pvt to chair. Pt unable to amb due to 10/10 pain. Pt unsafe to return home alone at this time. Recommending SNF upon d/c for longer time to recover and achieve safe mod I level of function.    Follow Up Recommendations  SNF;Supervision/Assistance - 24 hour     Equipment Recommendations  None recommended by PT    Recommendations for Other Services Rehab consult     Precautions / Restrictions Precautions Precautions: Back;Fall Precaution Comments: wound vac to the back, pt only able to remember 1/3 back precautions, pt re-educated Restrictions Weight Bearing Restrictions: No    Mobility  Bed Mobility Overal bed mobility: Needs Assistance Bed Mobility: Rolling;Sidelying to Sit Rolling: Mod assist Sidelying to sit: Mod assist       General bed mobility comments: HOB all the way up despite education that she doesn't have this option at home, extremely slow, reports 10/10 pain at incision, heavy use of bedrail  Transfers Overall transfer level: Needs assistance Equipment used: Rolling walker (2 wheeled) Transfers: Sit to/from Stand Sit to Stand: From elevated surface;Min assist Stand pivot transfers: Min assist       General transfer comment: pt requesting bed very elevated, pt educated to push up from bed however  continues to pull up on walker, pt with labored effort due to pain, significant increase in time, pt requiring assist to bring body weight over feet to become steady in standing, pt with wide base of support, minA under R arm to help offweight body weight so pt could step towards chair. Pt unable to ambulate due to 10/10 pain, pt in tears. Pt also with significant trunk flexion when attempting to sit in chair requiring max verbal and tactile cues to minimize trunk flexion and squat using her legs and reaching for chair rail  Ambulation/Gait             General Gait Details: pt "unable" as "I am in just too much pain."   Stairs             Wheelchair Mobility    Modified Rankin (Stroke Patients Only)       Balance Overall balance assessment: History of Falls;Needs assistance Sitting-balance support: Feet supported;Bilateral upper extremity supported Sitting balance-[name]: Fair Sitting balance - Comments: pt with R lateral lean due to pain, unable to sit upright without support of UEs   Standing balance support: During functional activity;Bilateral upper extremity supported Standing balance-[name]: Poor Standing balance comment: dependent on RW                            Cognition Arousal/Alertness: Awake/alert Behavior During Therapy: WFL for tasks assessed/performed Overall Cognitive Status: No family/caregiver present to determine baseline cognitive functioning Area of Impairment: Memory;Problem solving  Memory: Decreased recall of precautions       Problem Solving: Slow processing;Decreased initiation General Comments: suspect this is baseline      Exercises      General Comments General comments (skin integrity, edema, etc.): wound vac intact      Pertinent Vitals/Pain Pain Assessment: 0-10 Pain Score: 10-Worst pain ever Pain Location: back Pain Descriptors / Indicators: Sharp Pain Intervention(s):  Limited activity within patient's tolerance    Home Living                      Prior Function            PT Goals (current goals can now be found in the care plan section) Progress towards PT goals: Progressing toward goals    Frequency    Min 5X/week      PT Plan Current plan remains appropriate    Co-evaluation              AM-PAC PT "6 Clicks" Mobility   Outcome Measure  Help needed turning from your back to your side while in a flat bed without using bedrails?: A Lot Help needed moving from lying on your back to sitting on the side of a flat bed without using bedrails?: A Lot Help needed moving to and from a bed to a chair (including a wheelchair)?: A Lot Help needed standing up from a chair using your arms (e.g., wheelchair or bedside chair)?: A Lot Help needed to walk in hospital room?: A Lot Help needed climbing 3-5 steps with a railing? : A Lot 6 Click Score: 12    End of Session   Activity Tolerance: Patient limited by pain Patient left: in chair;with call bell/phone within reach;with nursing/sitter in room Nurse Communication: Mobility status PT Visit Diagnosis: Unsteadiness on feet (R26.81);History of falling (Z91.81);Pain Pain - part of body: (back)     Time: 4742-5956 PT Time Calculation (min) (ACUTE ONLY): 43 min  Charges:  $Gait Training: 8-22 mins $Therapeutic Activity: 23-37 mins                     Kittie Plater, PT, DPT Acute Rehabilitation Services Pager #: [phone] Office #: [phone]    Berline Lopes 02/29/2020, 10:49 AM

## 2020-02-29 NOTE — Progress Notes (Signed)
Patient ID: [name], female   DOB: 01-06-1961, 59 y.o.   MRN: [id number] BP 125/68 (BP Location: Left Arm)   Pulse 78   Temp 99 F (37.2 C)   Resp 13   LMP 02/18/2016   SpO2 100%  Alert and oriented x 4 Did not participate with PT, stated she hurt too much Informed her that participation is absolutely necessary Continue the abx, will obviously need a picc, waiting for blood to be clear Tee scheduled

## 2020-02-29 NOTE — TOC Initial Note (Signed)
Transition of Care [facility]) - Initial/Assessment Note    Patient Details  Name: [name] MRN: [id number] Date of Birth: [birth year]  Transition of Care [facility]) CM/SW Contact:    Vinie Sill, [place] Phone Number: 02/29/2020, 5:37 PM  Clinical Narrative:                  CSW visit with the patient at bedside. CSW introduced self and explained role. CSW discuss PT recommendation of short term rehab at [facility] Patient states she lives home alone and acknowledges the need for therapy. Patient is agreeable to short term rehab at [facility] before discharing home.CSW explained the SNF process. CSW was given permission to send SNF referrals. Patient states no preferred SNF. Patient no questions for concerns at this time.  CSW will provide bed offers once available.  Thurmond Butts, MSW, [place] Clinical Social Worker    Expected Discharge Plan: Skilled Nursing Facility Barriers to Discharge: Continued Medical Work up   Patient Goals and CMS Choice        Expected Discharge Plan and Services Expected Discharge Plan: [place] In-house Referral: Clinical Social Work     Living arrangements for the past 2 months: Single Family Home                                      Prior Living Arrangements/Services Living arrangements for the past 2 months: Single Family Home Lives with:: Self Patient language and need for interpreter reviewed:: No        Need for Family Participation in Patient Care: Yes (Comment) Care giver support system in place?: Yes (comment)   Criminal Activity/Legal Involvement Pertinent to Current Situation/Hospitalization: No - Comment as needed  Activities of Daily Living      Permission Sought/Granted Permission sought to share information with : Family Supports Permission granted to share information with : Yes, Verbal Permission Granted  Share Information with NAME: Madelaine Whipple  Permission granted to share info w AGENCY:  SNFs  Permission granted to share info w Relationship: son     Emotional Assessment Appearance:: Appears stated age Attitude/Demeanor/Rapport: Engaged Affect (typically observed): Appropriate, Pleasant(in pain) Orientation: : Oriented to Self, Oriented to Place, Oriented to  Time, Oriented to Situation Alcohol / Substance Use: Not Applicable Psych Involvement: No (comment)  Admission diagnosis:  Postoperative infection, unspecified type, initial encounter [T81.40XA] Abnormal gait due to muscle weakness [M62.81, R26.9] Postoperative wound infection [T81.49XA] Patient Active Problem List   Diagnosis Date Noted  . MSSA bacteremia 02/29/2020  . Postoperative wound infection 02/28/2020  . Abnormal gait due to muscle weakness 02/26/2020  . HNP (herniated nucleus pulposus), lumbar 02/16/2020  . Muscle weakness of lower extremity 02/14/2020  . Spondylolisthesis of lumbar region 10/30/2017  . Painful orthopaedic hardware right ankle 05/27/2016  . Closed fracture of distal end of right fibula and tibia   . Ankle syndesmosis disruption   . ASCUS favor benign 01/06/2014  . Leg swelling 12/22/2013  . Seasonal allergies   . Anemia   . Chronic back pain due to DJD with history surgery   . Hyperthyroidism   . 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 Status post left total prosthetic replacement of knee joint using cement 03/24/2012  . Fibromyalgia   . GERD (gastroesophageal reflux disease)   . Anxiety   . Depression   . Hypertension   . Lumbar degenerative  disc disease 09/25/2011   PCP:  Clovis Riley, L.August Saucer, MD Pharmacy:   [facility] [address], [name] - [address] [name] [address] Phone: [phone] Fax: [phone]  [facility] [place], [name] - [address] [name] [address] Phone: [phone] Fax: [phone]     Social Determinants of Health (SDOH) Interventions    Readmission  Risk Interventions No flowsheet data found.

## 2020-02-29 NOTE — H&P (View-Only) (Signed)
    [facility] has been requested to perform a transesophageal echocardiogram on [name] for bacteremia.  After careful review of history and examination, the risks and benefits of transesophageal echocardiogram have been explained including risks of esophageal damage, perforation (1:10,000 risk), bleeding, pharyngeal hematoma as well as other potential complications associated with conscious sedation including aspiration, arrhythmia, respiratory failure and death. Alternatives to treatment were discussed, questions were answered. Patient is willing to proceed.   Cortland Crehan, NP  02/29/2020 5:17 PM  

## 2020-02-29 NOTE — Progress Notes (Signed)
[place] for Infectious Disease  Date of Admission:  02/26/2020      Total days of antibiotics 3  Day 3 Nafcillin / Rifampin          ASSESSMENT: [name] is a 59 y.o. female with MSSA bacteremia in the setting of infected lumbar spine surgical site complicated by hardware. She has been afebrile on Nafcillin/Rifampin. Will repeat blood cultures today to ensure she is clearing. Will check transesophageal echo to work up endocarditis.    PLAN: 1. Repeat blood cultures 2. Continue Nafcillin IV 3. Chance rifampin PO - may cause nausea / GI side effects 4. TEE (scheduled for Wednesday)   Principal Problem:   MSSA bacteremia Active Problems:   Postoperative wound infection   Abnormal gait due to muscle weakness   . aspirin EC  81 mg Oral Daily  . atenolol  50 mg Oral Daily  . calcium citrate  200 mg of elemental calcium Oral Daily   And  . cholecalciferol  400 Units Oral Daily  . celecoxib  200 mg Oral BID  . chlorzoxazone  500 mg Oral TID  . docusate sodium  100 mg Oral BID  . furosemide  40 mg Oral Daily  . gabapentin  1,200 mg Oral TID  . heparin  5,000 Units Subcutaneous Q8H  . hydrochlorothiazide  25 mg Oral Daily  . lidocaine-EPINEPHrine  1.7 mL Intradermal Once  . lubiprostone  24 mcg Oral BID WC  . multivitamin with minerals  1 tablet Oral Daily  . oxyCODONE  10 mg Oral Q12H  . pantoprazole  40 mg Oral Daily  . rifampin  300 mg Oral BID WC  . senna  1 tablet Oral BID  . sodium chloride flush  3 mL Intravenous Q12H  . sodium chloride flush  3 mL Intravenous Q12H    SUBJECTIVE: She is in a lot of pain today. She tried to walk around yesterday a little but only able to make it to the chair today.  Difficult to get further ROS with current level of pain.  AFebrile, Leukocytosis 19.1 last on 5/23   Review of Systems: Review of Systems  Unable to perform ROS: Severity of pain    Allergies  Allergen Reactions  . Monosodium Glutamate  Anaphylaxis and Swelling    Eyes swollen shut, facial swelling, tongue swelling.  . Shellfish Allergy Anaphylaxis  . Celebrex [Celecoxib] Itching    Only allergic to generic brand  . Contrast Media [Iodinated Diagnostic Agents] Itching and Nausea Only    "could not walk"  . Diclofenac Itching    Can take the name brand Voltaren  . Other Other (See Comments)    Pet dander, Mildew, Mold  Cannot tolerate generic DICLOFENAC GEL--MUST USE BRAND NAME (Hiko)   . Latex Itching and Rash    Latex glove with powder    OBJECTIVE: Vitals:   02/28/20 2316 02/28/20 2317 02/29/20 0338 02/29/20 0733  BP: (!) 112/49 (!) 105/59 (!) 157/78 (!) 152/66  Pulse: 78 79 73 74  Resp: 16 20 20 13   Temp: 98.9 F (37.2 C) 98.3 F (36.8 C) 98.1 F (36.7 C) 97.6 F (36.4 C)  TempSrc: Oral Oral Oral Axillary  SpO2: 99% 100% 100% 100%   There is no height or weight on file to calculate BMI.  Physical Exam Constitutional:      Comments: Seated in recliner, grimacing in pain   Cardiovascular:  Rate and Rhythm: Normal rate.  Pulmonary:     Effort: Pulmonary effort is normal. No respiratory distress.  Abdominal:     General: There is no distension.     Palpations: Abdomen is soft.     Tenderness: There is no abdominal tenderness.  Musculoskeletal:     Comments: Wound vac with thin red drainage in cannister.   Skin:    General: Skin is warm and dry.  Neurological:     Mental Status: She is alert and oriented to person, place, and time.     Lab Results Lab Results  Component Value Date   WBC 19.1 (H) 02/28/2020   HGB 8.1 (L) 02/28/2020   HCT 26.4 (L) 02/28/2020   MCV 75.4 (L) 02/28/2020   PLT 321 02/28/2020    Lab Results  Component Value Date   CREATININE 0.69 02/28/2020   BUN 12 02/28/2020   NA 140 02/28/2020   K 4.5 02/28/2020   CL 106 02/28/2020   CO2 23 02/28/2020    Lab Results  Component Value Date   ALT 30 02/26/2020   AST 41 02/26/2020   ALKPHOS 106  02/26/2020   BILITOT 0.7 02/26/2020     Microbiology: Recent Results (from the past 240 hour(s))  SARS Coronavirus 2 by RT PCR (hospital order, performed in [facility] hospital lab) Nasopharyngeal Nasopharyngeal Swab     Status: None   Collection Time: 02/26/20  8:07 PM   Specimen: Nasopharyngeal Swab  Result Value Ref Range Status   SARS Coronavirus 2 NEGATIVE NEGATIVE Final    Comment: (NOTE) SARS-CoV-2 target nucleic acids are NOT DETECTED. The SARS-CoV-2 RNA is generally detectable in upper and lower respiratory specimens during the acute phase of infection. The lowest concentration of SARS-CoV-2 viral copies this assay can detect is 250 copies / mL. A negative result does not preclude SARS-CoV-2 infection and should not be used as the sole basis for treatment or other patient management decisions.  A negative result may occur with improper specimen collection / handling, submission of specimen other than nasopharyngeal swab, presence of viral mutation(s) within the areas targeted by this assay, and inadequate number of viral copies (<250 copies / mL). A negative result must be combined with clinical observations, patient history, and epidemiological information. Fact Sheet for Patients:   BoilerBrush.com.cy Fact Sheet for Healthcare Providers: https://pope.com/ This test is not yet approved or cleared  by the [place] FDA and has been authorized for detection and/or diagnosis of SARS-CoV-2 by FDA under an Emergency Use Authorization (EUA).  This EUA will remain in effect (meaning this test can be used) for the duration of the COVID-19 declaration under Section 564(b)(1) of the Act, 21 U.S.C. section 360bbb-3(b)(1), unless the authorization is terminated or revoked sooner. Performed at [facility], [address] [place], [name] [address]   Blood Culture (routine x 2)     Status: Abnormal   Collection Time:  02/26/20 10:50 PM   Specimen: BLOOD  Result Value Ref Range Status   Specimen Description BLOOD LEFT ANTECUBITAL  Final   Special Requests   Final    BOTTLES DRAWN AEROBIC AND ANAEROBIC Blood Culture adequate volume   Culture  Setup Time   Final    GRAM POSITIVE COCCI IN CLUSTERS AEROBIC BOTTLE ONLY Organism ID to follow CRITICAL RESULT CALLED TO, READ BACK BY AND VERIFIED WITH: Lytle Butte PHARMD, AT 1615 02/27/20 BY Renato Shin Performed at [facility], [address], [place], [name]  [address]    Culture STAPHYLOCOCCUS AUREUS (A)  Final   Report Status 02/29/2020 FINAL  Final   Organism ID, Bacteria STAPHYLOCOCCUS AUREUS  Final      Susceptibility   Staphylococcus aureus - MIC*    CIPROFLOXACIN <=0.5 SENSITIVE Sensitive     ERYTHROMYCIN <=0.25 SENSITIVE Sensitive     GENTAMICIN <=0.5 SENSITIVE Sensitive     OXACILLIN 0.5 SENSITIVE Sensitive     TETRACYCLINE <=1 SENSITIVE Sensitive     VANCOMYCIN <=0.5 SENSITIVE Sensitive     TRIMETH/SULFA <=10 SENSITIVE Sensitive     CLINDAMYCIN <=0.25 SENSITIVE Sensitive     RIFAMPIN <=0.5 SENSITIVE Sensitive     Inducible Clindamycin NEGATIVE Sensitive     * STAPHYLOCOCCUS AUREUS  Blood Culture ID Panel (Reflexed)     Status: Abnormal   Collection Time: 02/26/20 10:50 PM  Result Value Ref Range Status   Enterococcus species NOT DETECTED NOT DETECTED Final   Listeria monocytogenes NOT DETECTED NOT DETECTED Final   Staphylococcus species DETECTED (A) NOT DETECTED Final    Comment: M. MACCIA PHARMD, AT 1615 02/27/20 BY D. VANHOOK   Staphylococcus aureus (BCID) DETECTED (A) NOT DETECTED Final    Comment: Methicillin (oxacillin) susceptible Staphylococcus aureus (MSSA). Preferred therapy is anti staphylococcal beta lactam antibiotic (Cefazolin or Nafcillin), unless clinically contraindicated. CRITICAL RESULT CALLED TO, READ BACK BY AND VERIFIED WITH: Lytle Butte PHARMD, AT 1615 02/27/20 BY D. VANHOOK    Methicillin resistance NOT DETECTED NOT  DETECTED Final   Streptococcus species NOT DETECTED NOT DETECTED Final   Streptococcus agalactiae NOT DETECTED NOT DETECTED Final   Streptococcus pneumoniae NOT DETECTED NOT DETECTED Final   Streptococcus pyogenes NOT DETECTED NOT DETECTED Final   Acinetobacter baumannii NOT DETECTED NOT DETECTED Final   Enterobacteriaceae species NOT DETECTED NOT DETECTED Final   Enterobacter cloacae complex NOT DETECTED NOT DETECTED Final   Escherichia coli NOT DETECTED NOT DETECTED Final   Klebsiella oxytoca NOT DETECTED NOT DETECTED Final   Klebsiella pneumoniae NOT DETECTED NOT DETECTED Final   Proteus species NOT DETECTED NOT DETECTED Final   Serratia marcescens NOT DETECTED NOT DETECTED Final   Haemophilus influenzae NOT DETECTED NOT DETECTED Final   Neisseria meningitidis NOT DETECTED NOT DETECTED Final   Pseudomonas aeruginosa NOT DETECTED NOT DETECTED Final   Candida albicans NOT DETECTED NOT DETECTED Final   Candida glabrata NOT DETECTED NOT DETECTED Final   Candida krusei NOT DETECTED NOT DETECTED Final   Candida parapsilosis NOT DETECTED NOT DETECTED Final   Candida tropicalis NOT DETECTED NOT DETECTED Final    Comment: Performed at [facility], [address] [place], [name] [address]  Blood Culture (routine x 2)     Status: Abnormal   Collection Time: 02/26/20 11:00 PM   Specimen: BLOOD RIGHT HAND  Result Value Ref Range Status   Specimen Description BLOOD RIGHT HAND  Final   Special Requests   Final    BOTTLES DRAWN AEROBIC ONLY Blood Culture results may not be optimal due to an inadequate volume of blood received in culture bottles   Culture  Setup Time   Final    GRAM POSITIVE COCCI IN CLUSTERS AEROBIC BOTTLE ONLY CRITICAL VALUE NOTED.  VALUE IS CONSISTENT WITH PREVIOUSLY REPORTED AND CALLED VALUE. Performed at [facility], [address], [place], [name] [address]    Culture STAPHYLOCOCCUS AUREUS (A)  Final   Report Status 02/29/2020 FINAL  Final    Aerobic/Anaerobic Culture (surgical/deep wound)     Status: None (  Preliminary result)   Collection Time: 02/27/20 10:11 PM   Specimen: Wound  Result Value Ref Range Status   Specimen Description WOUND THORACIC  Final   Special Requests SPECIMEN A, PT ON NAFCILIN, RIFAMPIN  Final   Gram Stain   Final    RARE WBC PRESENT, PREDOMINANTLY PMN NO ORGANISMS SEEN Performed at [facility], [address], [place], [name] [address]    Culture   Final    FEW STAPHYLOCOCCUS AUREUS SUSCEPTIBILITIES TO FOLLOW NO ANAEROBES ISOLATED; CULTURE IN PROGRESS FOR 5 DAYS    Report Status PENDING  Incomplete  Aerobic/Anaerobic Culture (surgical/deep wound)     Status: None (Preliminary result)   Collection Time: 02/27/20 10:16 PM   Specimen: Wound  Result Value Ref Range Status   Specimen Description WOUND BACK  Final   Special Requests SPECIMEN B,PT ON NAFCILIN, RIFAMPIN,  Final   Gram Stain   Final    MODERATE WBC PRESENT, PREDOMINANTLY PMN FEW GRAM POSITIVE COCCI Performed at [facility], [address], [place], [name] [address]    Culture   Final    MODERATE STAPHYLOCOCCUS AUREUS CULTURE REINCUBATED FOR BETTER GROWTH NO ANAEROBES ISOLATED; CULTURE IN PROGRESS FOR 5 DAYS    Report Status PENDING  Incomplete    Rexene Alberts, MSN, NP-C [facility]  [email or web address] Pager: [phone] Office: [phone] [facility] Main Line: [phone]

## 2020-02-29 NOTE — Progress Notes (Signed)
  Echocardiogram 2D Echocardiogram has been performed.  Gerda Diss 02/29/2020, 2:22 PM

## 2020-02-29 NOTE — Progress Notes (Signed)
    [facility] has been requested to perform a transesophageal echocardiogram on [name] for bacteremia.  After careful review of history and examination, the risks and benefits of transesophageal echocardiogram have been explained including risks of esophageal damage, perforation (1:10,000 risk), bleeding, pharyngeal hematoma as well as other potential complications associated with conscious sedation including aspiration, arrhythmia, respiratory failure and death. Alternatives to treatment were discussed, questions were answered. Patient is willing to proceed.   Georgie Chard, NP  02/29/2020 5:17 PM

## 2020-03-01 ENCOUNTER — Inpatient Hospital Stay (HOSPITAL_COMMUNITY): Payer: Medicare Other | Admitting: Certified Registered Nurse Anesthetist

## 2020-03-01 ENCOUNTER — Encounter (HOSPITAL_COMMUNITY): Payer: Self-pay | Admitting: Neurosurgery

## 2020-03-01 ENCOUNTER — Ambulatory Visit (HOSPITAL_COMMUNITY): Admit: 2020-03-01 | Payer: Medicare Other | Admitting: Cardiology

## 2020-03-01 ENCOUNTER — Inpatient Hospital Stay (HOSPITAL_COMMUNITY): Payer: Medicare Other

## 2020-03-01 ENCOUNTER — Encounter (HOSPITAL_COMMUNITY)
Admission: EM | Disposition: A | Discharge status: Skilled Nursing Facility | Payer: Self-pay | Source: Home / Self Care | Attending: Neurosurgery

## 2020-03-01 DIAGNOSIS — R7881 Bacteremia: Secondary | ICD-10-CM

## 2020-03-01 DIAGNOSIS — I340 Nonrheumatic mitral (valve) insufficiency: Secondary | ICD-10-CM

## 2020-03-01 HISTORY — PX: TEE WITHOUT CARDIOVERSION: SHX5443

## 2020-03-01 LAB — GLUCOSE, CAPILLARY
Glucose-Capillary: 74 mg/dL (ref 70–99)
Glucose-Capillary: 58 mg/dL — ABNORMAL LOW (ref 70–99)
Glucose-Capillary: 89 mg/dL (ref 70–99)
Glucose-Capillary: 52 mg/dL — ABNORMAL LOW (ref 70–99)
Glucose-Capillary: 60 mg/dL — ABNORMAL LOW (ref 70–99)
Glucose-Capillary: 117 mg/dL — ABNORMAL HIGH (ref 70–99)
Glucose-Capillary: 134 mg/dL — ABNORMAL HIGH (ref 70–99)

## 2020-03-01 SURGERY — ECHOCARDIOGRAM, TRANSESOPHAGEAL
Anesthesia: Monitor Anesthesia Care

## 2020-03-01 MED ORDER — DEXTROSE 50 % IV SOLN
12.5000 g | Freq: Once | INTRAVENOUS | Status: AC
  Administered 2020-03-01: 12.5 g via INTRAVENOUS

## 2020-03-01 MED ORDER — DEXTROSE 50 % IV SOLN
INTRAVENOUS | Status: AC
  Filled 2020-03-01: qty 50

## 2020-03-01 MED ORDER — PROPOFOL 10 MG/ML IV BOLUS
INTRAVENOUS | Status: DC | PRN
  Administered 2020-03-01 (×3): 20 mg via INTRAVENOUS
  Administered 2020-03-01: 40 mg via INTRAVENOUS
  Administered 2020-03-01: 20 mg via INTRAVENOUS

## 2020-03-01 MED ORDER — PROPOFOL 500 MG/50ML IV EMUL
INTRAVENOUS | Status: DC | PRN
  Administered 2020-03-01: 100 ug/kg/min via INTRAVENOUS

## 2020-03-01 MED ORDER — LACTATED RINGERS IV SOLN: INTRAVENOUS | Status: DC | PRN

## 2020-03-01 MED ORDER — PHENYLEPHRINE HCL-NACL 10-0.9 MG/250ML-% IV SOLN
INTRAVENOUS | Status: DC | PRN
  Administered 2020-03-01: 25 ug/min via INTRAVENOUS

## 2020-03-01 NOTE — Progress Notes (Signed)
[facility]  Date of Admission:  02/26/2020      Total days of antibiotics 4  Day 4 Nafcillin / Rifampin          ASSESSMENT: [name] is a 59 y.o. 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 She has been afebrile on Nafcillin/Rifampin. She will have TEE this afternoon to evaluate for endocarditis - no murmur on exam. Repeat blood cultures pending from yesterday without update yet. Once clear x 72h can place PICC line   She is tolerating Rifampin PO well without any side effects.   Disposition likely to SNF based on PT note - she is hopeful to work on timing of pain meds with therapy to help participation effort. Encouragement provided.    PLAN: 1. Continue Nafcillin IV 2. Continue Rifampin PO 3. Follow up TEE 4. Follow repeat blood cultures for clearance from 5/25 5. ESR, CPR, LFT and CBC in AM for therapeutic monitoring.     Principal Problem:   MSSA bacteremia Active Problems:   Postoperative wound infection   Abnormal gait due to muscle weakness   . aspirin EC  81 mg Oral Daily  . atenolol  50 mg Oral Daily  . calcium citrate  200 mg of elemental calcium Oral Daily   And  . cholecalciferol  400 Units Oral Daily  . celecoxib  200 mg Oral BID  . chlorzoxazone  500 mg Oral TID  . docusate sodium  100 mg Oral BID  . furosemide  40 mg Oral Daily  . gabapentin  1,200 mg Oral TID  . heparin  5,000 Units Subcutaneous Q8H  . hydrochlorothiazide  25 mg Oral Daily  . insulin aspart  0-15 Units Subcutaneous TID WC  . insulin aspart  0-5 Units Subcutaneous QHS  . lidocaine-EPINEPHrine  1.7 mL Intradermal Once  . lubiprostone  24 mcg Oral BID WC  . multivitamin with minerals  1 tablet Oral Daily  . oxyCODONE  15 mg Oral Q12H  . pantoprazole  40 mg Oral Daily  . rifampin  300 mg Oral BID WC  . senna  1 tablet Oral BID  . sodium chloride flush  3 mL  Intravenous Q12H  . sodium chloride flush  3 mL Intravenous Q12H    SUBJECTIVE: She states her pain is much improved. Has trouble with completing PT participation with timing of pain medications. She is hopeful to avoid a SNF but willing to go if this is best for her.  Afebrile. Tolerating antibiotics well.    Review of Systems: Review of Systems  All other systems reviewed and are negative.   Allergies  Allergen Reactions  . Monosodium Glutamate Anaphylaxis and Swelling    Eyes swollen shut, facial swelling, tongue swelling.  . Shellfish Allergy Anaphylaxis  . Celebrex [Celecoxib] Itching    Only allergic to generic brand  . Contrast Media [Iodinated Diagnostic Agents] Itching and Nausea Only    "could not walk"  . Diclofenac Itching    Can take the name brand Voltaren  . Other Other (See Comments)    Pet dander, Mildew, Mold  Cannot tolerate generic DICLOFENAC GEL--MUST USE BRAND NAME ([place])   . Latex Itching and Rash    Latex glove with powder    OBJECTIVE: Vitals:   02/29/20 1956 03/01/20 0001 03/01/20 0338 03/01/20 0756  BP: (!) 144/71 120/67 (!) 158/71 127/67  Pulse: 88 76 77 73  Resp:  _0 Temp: 99.3 F (37.4 C) 98.6 F (37 C) 97.6 F (36.4 C) 98.5 F (36.9 C)  TempSrc: Oral Oral Axillary Oral  SpO2: 100% 98% 93% 100%   There is no height or weight on file to calculate BMI.  Physical Exam Constitutional:      Comments: Resting comfortably in bed. No distress.   Cardiovascular:     Rate and Rhythm: Normal rate.  Pulmonary:     Effort: Pulmonary effort is normal. No respiratory distress.  Abdominal:     General: There is no distension.     Palpations: Abdomen is soft.     Tenderness: There is no abdominal tenderness.  Musculoskeletal:     Comments: Wound vac with thin red drainage in cannister.   Skin:    General: Skin is warm and dry.     Capillary Refill: Capillary refill takes less than 2 seconds.  Neurological:      Mental Status: She is alert and oriented to person, place, and time.     Lab Results Lab Results  Component Value Date   WBC 19.1 (H) 02/28/2020   HGB 8.1 (L) 02/28/2020   HCT 26.4 (L) 02/28/2020   MCV 75.4 (L) 02/28/2020   PLT 321 02/28/2020    Lab Results  Component Value Date   CREATININE 0.69 02/28/2020   BUN 12 02/28/2020   NA 140 02/28/2020   K 4.5 02/28/2020   CL 106 02/28/2020   CO2 23 02/28/2020    Lab Results  Component Value Date   ALT 30 02/26/2020   AST 41 02/26/2020   ALKPHOS 106 02/26/2020   BILITOT 0.7 02/26/2020     Microbiology: Recent Results (from the past 240 hour(s))  SARS Coronavirus 2 by RT PCR (hospital order, performed in [place] hospital lab) Nasopharyngeal Nasopharyngeal Swab     Status: None   Collection Time: 02/26/20  8:07 PM   Specimen: Nasopharyngeal Swab  Result Value Ref Range Status   SARS Coronavirus 2 NEGATIVE NEGATIVE Final    Comment: (NOTE) SARS-CoV-2 target nucleic acids are NOT DETECTED. The SARS-CoV-2 RNA is generally detectable in upper and lower respiratory specimens during the acute phase of infection. The lowest concentration of SARS-CoV-2 viral copies this assay can detect is 250 copies / mL. A negative result does not preclude SARS-CoV-2 infection and should not be used as the sole basis for treatment or other patient management decisions.  A negative result may occur with improper specimen collection / handling, submission of specimen other than nasopharyngeal swab, presence of viral mutation(s) within the areas targeted by this assay, and inadequate number of viral copies (<250 copies / mL). A negative result must be combined with clinical observations, patient history, and epidemiological information. Fact Sheet for Patients:   StrictlyIdeas.no Fact Sheet for Healthcare Providers: BankingDealers.co.za This test is not yet approved or cleared  by the [place] FDA and has been authorized for detection and/or diagnosis of SARS-CoV-2 by FDA under an Emergency Use Authorization (EUA).  This EUA will remain in effect (meaning this test can be used) for the duration of the COVID-19 declaration under Section 564(b)(1) of the Act, 21 U.S.C. section 360bbb-3(b)(1), unless the authorization is terminated or revoked sooner. Performed at [facility], [place] [address], [place], [place] [address]   Blood Culture (routine x 2)     Status: Abnormal   Collection Time: 02/26/20 10:50 PM   Specimen:  BLOOD  Result Value Ref Range Status   Specimen Description BLOOD LEFT ANTECUBITAL  Final   Special Requests   Final    BOTTLES DRAWN AEROBIC AND ANAEROBIC Blood Culture adequate volume   Culture  Setup Time   Final    GRAM POSITIVE COCCI IN CLUSTERS AEROBIC BOTTLE ONLY Organism ID to follow CRITICAL RESULT CALLED TO, READ BACK BY AND VERIFIED WITH: Hughie Closs PHARMD, AT 9449 02/27/20 BY Rush Landmark Performed at [facility], [place] [address], [place], [place] [address]    Culture STAPHYLOCOCCUS AUREUS (A)  Final   Report Status 02/29/2020 FINAL  Final   Organism ID, Bacteria STAPHYLOCOCCUS AUREUS  Final      Susceptibility   Staphylococcus aureus - MIC*    CIPROFLOXACIN <=0.5 SENSITIVE Sensitive     ERYTHROMYCIN <=0.25 SENSITIVE Sensitive     GENTAMICIN <=0.5 SENSITIVE Sensitive     OXACILLIN 0.5 SENSITIVE Sensitive     TETRACYCLINE <=1 SENSITIVE Sensitive     VANCOMYCIN <=0.5 SENSITIVE Sensitive     TRIMETH/SULFA <=10 SENSITIVE Sensitive     CLINDAMYCIN <=0.25 SENSITIVE Sensitive     RIFAMPIN <=0.5 SENSITIVE Sensitive     Inducible Clindamycin NEGATIVE Sensitive     * STAPHYLOCOCCUS AUREUS  Blood Culture ID Panel (Reflexed)     Status: Abnormal   Collection Time: 02/26/20 10:50 PM  Result Value Ref Range Status   Enterococcus species NOT DETECTED NOT DETECTED Final   Listeria monocytogenes NOT DETECTED NOT DETECTED Final    Staphylococcus species DETECTED (A) NOT DETECTED Final    Comment: M. Broadway, AT 1615 02/27/20 BY D. VANHOOK   Staphylococcus aureus (BCID) DETECTED (A) NOT DETECTED Final    Comment: Methicillin (oxacillin) susceptible Staphylococcus aureus (MSSA). Preferred therapy is anti staphylococcal beta lactam antibiotic (Cefazolin or Nafcillin), unless clinically contraindicated. CRITICAL RESULT CALLED TO, READ BACK BY AND VERIFIED WITH: Hughie Closs PHARMD, AT 1615 02/27/20 BY D. VANHOOK    Methicillin resistance NOT DETECTED NOT DETECTED Final   Streptococcus species NOT DETECTED NOT DETECTED Final   Streptococcus agalactiae NOT DETECTED NOT DETECTED Final   Streptococcus pneumoniae NOT DETECTED NOT DETECTED Final   Streptococcus pyogenes NOT DETECTED NOT DETECTED Final   Acinetobacter baumannii NOT DETECTED NOT DETECTED Final   Enterobacteriaceae species NOT DETECTED NOT DETECTED Final   Enterobacter cloacae complex NOT DETECTED NOT DETECTED Final   Escherichia coli NOT DETECTED NOT DETECTED Final   Klebsiella oxytoca NOT DETECTED NOT DETECTED Final   Klebsiella pneumoniae NOT DETECTED NOT DETECTED Final   Proteus species NOT DETECTED NOT DETECTED Final   Serratia marcescens NOT DETECTED NOT DETECTED Final   Haemophilus influenzae NOT DETECTED NOT DETECTED Final   Neisseria meningitidis NOT DETECTED NOT DETECTED Final   Pseudomonas aeruginosa NOT DETECTED NOT DETECTED Final   Candida albicans NOT DETECTED NOT DETECTED Final   Candida glabrata NOT DETECTED NOT DETECTED Final   Candida krusei NOT DETECTED NOT DETECTED Final   Candida parapsilosis NOT DETECTED NOT DETECTED Final   Candida tropicalis NOT DETECTED NOT DETECTED Final    Comment: Performed at [facility], [place]. [address] [place], [place] [address]  Blood Culture (routine x 2)     Status: Abnormal   Collection Time: 02/26/20 11:00 PM   Specimen: BLOOD RIGHT HAND  Result Value Ref Range Status   Specimen Description BLOOD  RIGHT HAND  Final   Special Requests   Final    BOTTLES DRAWN AEROBIC ONLY Blood Culture results  may not be optimal due to an inadequate volume of blood received in culture bottles   Culture  Setup Time   Final    GRAM POSITIVE COCCI IN CLUSTERS AEROBIC BOTTLE ONLY CRITICAL VALUE NOTED.  VALUE IS CONSISTENT WITH PREVIOUSLY REPORTED AND CALLED VALUE. Performed at [facility], [place] [address] [place], [place] [address]    Culture STAPHYLOCOCCUS AUREUS (A)  Final   Report Status 02/29/2020 FINAL  Final  Aerobic/Anaerobic Culture (surgical/deep wound)     Status: None (Preliminary result)   Collection Time: 02/27/20 10:11 PM   Specimen: Wound  Result Value Ref Range Status   Specimen Description WOUND THORACIC  Final   Special Requests SPECIMEN A, PT ON NAFCILIN, RIFAMPIN  Final   Gram Stain   Final    RARE WBC PRESENT, PREDOMINANTLY PMN NO ORGANISMS SEEN Performed at [facility], [place] [address], [place], [place] [address]    Culture   Final    FEW STAPHYLOCOCCUS AUREUS NO ANAEROBES ISOLATED; CULTURE IN PROGRESS FOR 5 DAYS    Report Status PENDING  Incomplete   Organism ID, Bacteria STAPHYLOCOCCUS AUREUS  Final      Susceptibility   Staphylococcus aureus - MIC*    CIPROFLOXACIN <=0.5 SENSITIVE Sensitive     ERYTHROMYCIN <=0.25 SENSITIVE Sensitive     GENTAMICIN <=0.5 SENSITIVE Sensitive     OXACILLIN 0.5 SENSITIVE Sensitive     TETRACYCLINE <=1 SENSITIVE Sensitive     VANCOMYCIN <=0.5 SENSITIVE Sensitive     TRIMETH/SULFA <=10 SENSITIVE Sensitive     CLINDAMYCIN <=0.25 SENSITIVE Sensitive     RIFAMPIN <=0.5 SENSITIVE Sensitive     Inducible Clindamycin NEGATIVE Sensitive     * FEW STAPHYLOCOCCUS AUREUS  Aerobic/Anaerobic Culture (surgical/deep wound)     Status: None (Preliminary result)   Collection Time: 02/27/20 10:16 PM   Specimen: Wound  Result Value Ref Range Status   Specimen Description WOUND BACK  Final   Special Requests SPECIMEN B,PT ON NAFCILIN,  RIFAMPIN,  Final   Gram Stain   Final    MODERATE WBC PRESENT, PREDOMINANTLY PMN FEW GRAM POSITIVE COCCI Performed at [facility], [place] [address], [place], [place] [address]    Culture   Final    MODERATE STAPHYLOCOCCUS AUREUS NO ANAEROBES ISOLATED; CULTURE IN PROGRESS FOR 5 DAYS    Report Status PENDING  Incomplete   Organism ID, Bacteria STAPHYLOCOCCUS AUREUS  Final      Susceptibility   Staphylococcus aureus - MIC*    CIPROFLOXACIN <=0.5 SENSITIVE Sensitive     ERYTHROMYCIN <=0.25 SENSITIVE Sensitive     GENTAMICIN <=0.5 SENSITIVE Sensitive     OXACILLIN 0.5 SENSITIVE Sensitive     TETRACYCLINE <=1 SENSITIVE Sensitive     VANCOMYCIN <=0.5 SENSITIVE Sensitive     TRIMETH/SULFA <=10 SENSITIVE Sensitive     CLINDAMYCIN <=0.25 SENSITIVE Sensitive     RIFAMPIN <=0.5 SENSITIVE Sensitive     Inducible Clindamycin NEGATIVE Sensitive     * MODERATE STAPHYLOCOCCUS AUREUS    Janene Madeira, MSN, NP-C [facility] [email or web address] Pager: [phone] Office: [phone] [place]: [phone]

## 2020-03-01 NOTE — Progress Notes (Addendum)
  Echocardiogram Echocardiogram Transesophageal has been performed.  Jlyn Bracamonte A Montell Leopard 03/01/2020, 3:03 PM

## 2020-03-01 NOTE — Progress Notes (Signed)
Patient ID: [name], female   DOB: October 28, 1960, 59 y.o.   MRN: [id number] BP (!) 105/56   Pulse 67   Temp 97.7 F (36.5 C) (Temporal)   Resp (!) 22   LMP 02/18/2016   SpO2 100%  Lethargic, just returned from TEE. No vegetations noted on TEE. Moving all extremities Wound vac dressing changed, noted to have bloody drainage. Continue with PT/OT Continue abx. Waiting for 72hrs afebrile, negative blood cultures.

## 2020-03-01 NOTE — NC FL2 (Signed)
[place] LEVEL OF CARE SCREENING TOOL     IDENTIFICATION  Patient Name: [name] Birthdate: July 06, 1961 Sex: female Admission Date (Current Location): 02/26/2020  [facility] and [place] Number:  Herbalist and Address:  [facility], [place] [address] [place], [place] [address]      Provider Number: [id number]  Attending Physician Name and Address:  Ashok Pall, MD  Relative Name and Phone Number:       Current Level of Care: Hospital Recommended Level of Care: [place] Prior Approval Number:    Date Approved/Denied:   PASRR Number: [id number]  Discharge Plan: SNF    Current Diagnoses: Patient Active Problem List   Diagnosis Date Noted  . MSSA bacteremia 02/29/2020  . Postoperative wound infection 02/28/2020  . Abnormal gait due to muscle weakness 02/26/2020  . HNP (herniated nucleus pulposus), lumbar 02/16/2020  . Muscle weakness of lower extremity 02/14/2020  . Spondylolisthesis of lumbar region 10/30/2017  . Painful orthopaedic hardware right ankle 05/27/2016  . Closed fracture of distal end of right fibula and tibia   . Ankle syndesmosis disruption   . ASCUS favor benign 01/06/2014  . Leg swelling 12/22/2013  . Seasonal allergies   . Anemia   . Chronic back pain due to DJD with history surgery   . Hyperthyroidism   . 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 Status post left total prosthetic replacement of knee joint using cement 03/24/2012  . Fibromyalgia   . GERD (gastroesophageal reflux disease)   . Anxiety   . Depression   . Hypertension   . Lumbar degenerative disc disease 09/25/2011    Orientation RESPIRATION BLADDER Height & Weight     Self, Time, Situation, Place  Normal External catheter, Continent Weight:   Height:     BEHAVIORAL SYMPTOMS/MOOD NEUROLOGICAL BOWEL NUTRITION STATUS      Continent Diet(please see discharge summary)  AMBULATORY  STATUS COMMUNICATION OF NEEDS Skin     Verbally Surgical wounds, Wound Vac(closed incision back, negative pressure wound therapy back posterior)                       Personal Care Assistance Level of Assistance  Bathing, Dressing, Feeding Bathing Assistance: Limited assistance Feeding assistance: Independent Dressing Assistance: Limited assistance     Functional Limitations Info  Sight, Hearing, Speech Sight Info: Adequate Hearing Info: Adequate Speech Info: Adequate    SPECIAL CARE FACTORS FREQUENCY  PT (By licensed PT), OT (By licensed OT)     PT Frequency: 3x per week OT Frequency: 3x per week            Contractures Contractures Info: Not present    Additional Factors Info  Code Status, Allergies, Insulin Sliding Scale Code Status Info: FULL Allergies Info: Monosodium Glutamate,Shellfish Allergy,Celebrex,Contrast Media,Diclofenac,Latex   Insulin Sliding Scale Info: insulin aspart (novoLOG) injection 0-15 Units ,insulin aspart (novoLOG) injection 0-5 Units       Current Medications (03/01/2020):  This is the current hospital active medication list Current Facility-Administered Medications  Medication Dose Route Frequency Provider Last Rate Last Admin  . 0.9 %  sodium chloride infusion  250 mL Intravenous PRN Ashok Pall, MD      . 0.9 %  sodium chloride infusion  250 mL Intravenous Continuous Ashok Pall, MD 1 mL/hr at 02/28/20 0031 250 mL at 02/28/20 0031  . 0.9 %  sodium chloride infusion   Intravenous Continuous Glenford Peers,  Deri Fuelling, NP      . 0.9 % NaCl with KCl 20 mEq/ L  infusion   Intravenous Continuous Coletta Memos, MD 80 mL/hr at 02/29/20 2359 New Bag at 02/29/20 2359  . acetaminophen (TYLENOL) tablet 650 mg  650 mg Oral Q6H PRN Coletta Memos, MD   650 mg at 02/27/20 1110   Or  . acetaminophen (TYLENOL) suppository 650 mg  650 mg Rectal Q6H PRN Coletta Memos, MD      . albuterol (PROVENTIL) (2.5 MG/3ML) 0.083% nebulizer solution 2.5 mg  2.5 mg  Inhalation Q6H PRN Coletta Memos, MD      . aspirin EC tablet 81 mg  81 mg Oral Daily Coletta Memos, MD   81 mg at 03/01/20 0903  . atenolol (TENORMIN) tablet 50 mg  50 mg Oral Daily Coletta Memos, MD   50 mg at 03/01/20 0903  . bisacodyl (DULCOLAX) EC tablet 5 mg  5 mg Oral Daily PRN Coletta Memos, MD      . calcium citrate (CALCITRATE - dosed in mg elemental calcium) tablet 200 mg of elemental calcium  200 mg of elemental calcium Oral Daily Coletta Memos, MD   Stopped at 03/01/20 0909   And  . cholecalciferol (VITAMIN D3) tablet 400 Units  400 Units Oral Daily Coletta Memos, MD   Stopped at 03/01/20 [phone]  . celecoxib (CELEBREX) capsule 200 mg  200 mg Oral BID Coletta Memos, MD   200 mg at 03/01/20 0904  . chlorzoxazone (PARAFON) tablet 500 mg  500 mg Oral TID Coletta Memos, MD   500 mg at 03/01/20 0908  . diazepam (VALIUM) tablet 5 mg  5 mg Oral Q6H PRN Coletta Memos, MD   5 mg at 03/01/20 0903  . diphenhydrAMINE (BENADRYL) 12.5 MG/5ML elixir 50 mg  50 mg Oral Q6H PRN Coletta Memos, MD   50 mg at 03/01/20 0858  . docusate sodium (COLACE) capsule 100 mg  100 mg Oral BID Coletta Memos, MD   100 mg at 02/29/20 2117  . furosemide (LASIX) tablet 40 mg  40 mg Oral Daily Coletta Memos, MD   40 mg at 03/01/20 0904  . gabapentin (NEURONTIN) tablet 1,200 mg  1,200 mg Oral TID Coletta Memos, MD   1,200 mg at 03/01/20 0903  . heparin injection 5,000 Units  5,000 Units Subcutaneous Q8H Coletta Memos, MD   Stopped at 03/01/20 6967  . hydrochlorothiazide (HYDRODIURIL) tablet 25 mg  25 mg Oral Daily Coletta Memos, MD   25 mg at 03/01/20 0903  . insulin aspart (novoLOG) injection 0-15 Units  0-15 Units Subcutaneous TID WC Coletta Memos, MD      . insulin aspart (novoLOG) injection 0-5 Units  0-5 Units Subcutaneous QHS Coletta Memos, MD      . lidocaine-EPINEPHrine (XYLOCAINE W/EPI) 2 %-1:200000 (PF) injection 1.7 mL  1.7 mL Intradermal Once Coletta Memos, MD      . lubiprostone (AMITIZA) capsule 24 mcg  24 mcg  Oral BID WC Coletta Memos, MD   24 mcg at 02/29/20 1607  . magnesium citrate solution 1 Bottle  1 Bottle Oral Once PRN Coletta Memos, MD      . menthol-cetylpyridinium (CEPACOL) lozenge 3 mg  1 lozenge Oral PRN Coletta Memos, MD       Or  . phenol (CHLORASEPTIC) mouth spray 1 spray  1 spray Mouth/Throat PRN Coletta Memos, MD      . morphine 2 MG/ML injection 2 mg  2 mg Intravenous Q2H PRN Coletta Memos, MD  2 mg at 03/01/20 0856  . multivitamin with minerals tablet 1 tablet  1 tablet Oral Daily Coletta Memos, MD   Stopped at 03/01/20 2542  . nafcillin 12 g in sodium chloride 0.9 % 500 mL continuous infusion  12 g Intravenous Q24H Della Goo, RPH 20.8 mL/hr at 02/29/20 1601 12 g at 02/29/20 1601  . ondansetron (ZOFRAN) tablet 4 mg  4 mg Oral Q6H PRN Coletta Memos, MD       Or  . ondansetron (ZOFRAN) injection 4 mg  4 mg Intravenous Q6H PRN Coletta Memos, MD      . oxyCODONE (Oxy IR/ROXICODONE) immediate release tablet 10 mg  10 mg Oral Q3H PRN Coletta Memos, MD   10 mg at 02/29/20 1426  . oxyCODONE (Oxy IR/ROXICODONE) immediate release tablet 5 mg  5 mg Oral Q3H PRN Coletta Memos, MD      . oxyCODONE (OXYCONTIN) 12 hr tablet 15 mg  15 mg Oral Q12H Coletta Memos, MD   15 mg at 03/01/20 0904  . pantoprazole (PROTONIX) EC tablet 40 mg  40 mg Oral Daily Coletta Memos, MD   40 mg at 03/01/20 0903  . rifampin (RIFADIN) capsule 300 mg  300 mg Oral BID WC Della Goo, RPH   300 mg at 03/01/20 7062  . senna (SENOKOT) tablet 8.6 mg  1 tablet Oral BID Coletta Memos, MD   Stopped at 03/01/20 0913  . sodium chloride flush (NS) 0.9 % injection 3 mL  3 mL Intravenous Q12H Coletta Memos, MD   3 mL at 02/29/20 2118  . sodium chloride flush (NS) 0.9 % injection 3 mL  3 mL Intravenous PRN Coletta Memos, MD      . sodium chloride flush (NS) 0.9 % injection 3 mL  3 mL Intravenous Q12H Coletta Memos, MD   3 mL at 02/29/20 2118  . sodium chloride flush (NS) 0.9 % injection 3 mL  3 mL Intravenous PRN  Coletta Memos, MD      . zolpidem (AMBIEN) tablet 5 mg  5 mg Oral QHS PRN Coletta Memos, MD         Discharge Medications: Please see discharge summary for a list of discharge medications.  Relevant Imaging Results:  Relevant Lab Results:   Additional Information [id number]     Wound Vac  & dressing change MWF  Eduard Roux, LCSWA

## 2020-03-01 NOTE — Anesthesia Postprocedure Evaluation (Signed)
Anesthesia Post Note  Patient: Clare Gandy  Procedure(s) Performed: TRANSESOPHAGEAL ECHOCARDIOGRAM (TEE) (N/A )     Patient location during evaluation: Endoscopy Anesthesia Type: MAC Level of consciousness: awake and alert Pain management: pain level controlled Vital Signs Assessment: post-procedure vital signs reviewed and stable Respiratory status: spontaneous breathing, nonlabored ventilation, respiratory function stable and patient connected to nasal cannula oxygen Cardiovascular status: stable and blood pressure returned to baseline Postop Assessment: no apparent nausea or vomiting Anesthetic complications: no    Last Vitals:  Vitals:   03/01/20 1525 03/01/20 1645  BP: (!) 105/56 140/70  Pulse: 67 72  Resp: (!) 22 15  Temp:  37.1 C  SpO2: 100% 100%    Last Pain:  Vitals:   03/01/20 1645  TempSrc: Oral  PainSc:                  Teyah Rossy COKER

## 2020-03-01 NOTE — Progress Notes (Signed)
Paged cardiology on call and he called me back.  Wanted to make sure she could take sips with meds before procedure and he approved.  Gave patient morphine this morning while the FNP changed the dressing for the lumbar surgical wound.

## 2020-03-01 NOTE — Consult Note (Addendum)
WOC Nurse Consult Note: Reason for Consult: Nonhealing lumbar surgical wound x2 with wound VAC in place..  Wound type:infectious surgical Pressure Injury POA: NA Measurement: proximal:  6 cm x 3 cm x 8 cm with hardware visible.  Distal:  13 cm x 5 cm x 14 cm with hardware visible Wound bed: ruddy red with bleeding Drainage (amount, consistency, odor) heavy bloody drainage Periwound: intact  Dressing procedure/placement/frequency: Cleanse wounds with NS and pat dry. Bridge two wounds with drape on intact skin.  Both wounds filled with black foam.  Covered with drape and seal immediately achieved  Change MOnday/Wednesday/Friday WOC team will follow.  MD requests dressing changes Mon/Wed/Fri at 10 AM.   Maple Hudson MSN, RN, FNP-BC CWON Wound, Ostomy, Continence Nurse Pager [phone]

## 2020-03-01 NOTE — CV Procedure (Signed)
   Transesophageal Echocardiogram  Indications: Bacteremia  Time out performed  Anesthesia with propofol  Findings:  Left Ventricle: Normal EF  Mitral Valve: Mitral MR mild  Aortic Valve: Normal   Tricuspid Valve: Trivial TR  Left Atrium: No LAA  Right Atrium: Chiari network noted  IMPRESSION: no endocarditis  Jill Schultz, MD

## 2020-03-01 NOTE — Progress Notes (Signed)
Patients son wants a call from Child psychotherapist. Despina Hick [phone]

## 2020-03-01 NOTE — Anesthesia Preprocedure Evaluation (Signed)
Anesthesia Evaluation  Patient identified by MRN, date of birth, ID band Patient awake    Reviewed: NPO status , Patient's Chart, lab work & pertinent test results  Airway Mallampati: II  TM Distance: >3 FB Neck ROM: Full    Dental  (+) Edentulous Upper, Edentulous Lower   Pulmonary former smoker,    breath sounds clear to auscultation       Cardiovascular hypertension,  Rhythm:Regular Rate:Normal     Neuro/Psych    GI/Hepatic   Endo/Other    Renal/GU      Musculoskeletal   Abdominal (+) + obese,   Peds  Hematology   Anesthesia Other Findings   Reproductive/Obstetrics                             Anesthesia Physical Anesthesia Plan  ASA: III  Anesthesia Plan: MAC   Post-op Pain Management:    Induction: Intravenous  PONV Risk Score and Plan: Propofol infusion  Airway Management Planned: Natural Airway  Additional Equipment:   Intra-op Plan:   Post-operative Plan:   Informed Consent:   Plan Discussed with: CRNA and Anesthesiologist  Anesthesia Plan Comments:         Anesthesia Quick Evaluation

## 2020-03-01 NOTE — Anesthesia Postprocedure Evaluation (Signed)
Anesthesia Post Note  Patient: [name]  Procedure(s) Performed: WOUND EXPLORATION (N/A Back)     Anesthesia Post Evaluation  Last Vitals:  Vitals:   03/01/20 1525 03/01/20 1645  BP: (!) 105/56 140/70  Pulse: 67 72  Resp: (!) 22 15  Temp:  37.1 C  SpO2: 100% 100%    Last Pain:  Vitals:   03/01/20 1645  TempSrc: Oral  PainSc:                  Keva Darty COKER

## 2020-03-01 NOTE — Interval H&P Note (Signed)
History and Physical Interval Note:  03/01/2020 1:48 PM  [name]  has presented today for surgery, with the diagnosis of BACTERMIA.  The various methods of treatment have been discussed with the patient and family. After consideration of risks, benefits and other options for treatment, the patient has consented to  Procedure(s): TRANSESOPHAGEAL ECHOCARDIOGRAM (TEE) (N/A) as a surgical intervention.  The patient's history has been reviewed, patient examined, no change in status, stable for surgery.  I have reviewed the patient's chart and labs.  Questions were answered to the patient's satisfaction.     [employer]

## 2020-03-01 NOTE — Progress Notes (Signed)
PT Cancellation Note  Patient Details Name: [name] MRN: [id number] DOB: 1961/05/09   Cancelled Treatment:    Reason Eval/Treat Not Completed: Patient declined, no reason specified. PT attempted to see patient twice today for PT treatment. Upon first attempt at 1150 pt declining as she had not had pain medication recently. Pt reports she needs pain medicine in her system roughly one hour prior to mobilizing. Pt requesting PT return later in the day after TEE which is scheduled for 1PM. Upon 2nd attempt PT returns at 1612, pt reports just returning from TEE and needs pain medication and to eat prior to participating with PT. PT offers to check in around 5PM, one hour after pain medicine would be given, but pt reports she would prefer not to rush things and would like for therapy to return tomorrow. PT will attempt to follow up as time allows.   Arlyss Gandy 03/01/2020, 4:12 PM

## 2020-03-01 NOTE — Progress Notes (Deleted)
  Echocardiogram 2D Echocardiogram has been performed.  Garyson Stelly A Fermin Yan 03/01/2020, 3:04 PM

## 2020-03-01 NOTE — Progress Notes (Addendum)
Patient had a low blood sugar of 58 and needed D50. 12.5 mg was given to her at 1252. The endo team came at 1257 and Carollee Herter said they would get her blood sugar down stairs. I called the team and Dawn said they would remind her because it is not showing up in the system.

## 2020-03-01 NOTE — Transfer of Care (Signed)
Immediate Anesthesia Transfer of Care Note  Patient: [name]  Procedure(s) Performed: TRANSESOPHAGEAL ECHOCARDIOGRAM (TEE) (N/A )  Patient Location: Endoscopy Unit  Anesthesia Type:MAC  Level of Consciousness: awake, alert , oriented and patient cooperative  Airway & Oxygen Therapy: Patient Spontanous Breathing and Patient connected to nasal cannula oxygen  Post-op Assessment: Report given to RN and Post -op Vital signs reviewed and stable  Post vital signs: Reviewed and stable  Last Vitals:  Vitals Value Taken Time  BP 99/46 03/01/20 1446  Temp    Pulse 64 03/01/20 1447  Resp 18 03/01/20 1447  SpO2 100 % 03/01/20 1447  Vitals shown include unvalidated device data.  Last Pain:  Vitals:   03/01/20 1445  TempSrc: (P) Temporal  PainSc:       Patients Stated Pain Goal: 0 (30/05/11 0211)  Complications: No apparent anesthesia complications

## 2020-03-02 ENCOUNTER — Inpatient Hospital Stay: Payer: Self-pay

## 2020-03-02 DIAGNOSIS — B9561 Methicillin susceptible Staphylococcus aureus infection as the cause of diseases classified elsewhere: Secondary | ICD-10-CM

## 2020-03-02 DIAGNOSIS — T8149XA Infection following a procedure, other surgical site, initial encounter: Secondary | ICD-10-CM

## 2020-03-02 DIAGNOSIS — R269 Unspecified abnormalities of gait and mobility: Secondary | ICD-10-CM

## 2020-03-02 DIAGNOSIS — M6281 Muscle weakness (generalized): Secondary | ICD-10-CM

## 2020-03-02 DIAGNOSIS — R7881 Bacteremia: Secondary | ICD-10-CM

## 2020-03-02 DIAGNOSIS — M797 Fibromyalgia: Secondary | ICD-10-CM

## 2020-03-02 LAB — BASIC METABOLIC PANEL
Anion gap: 15 (ref 5–15)
BUN: 10 mg/dL (ref 6–20)
CO2: 26 mmol/L (ref 22–32)
Calcium: 8 mg/dL — ABNORMAL LOW (ref 8.9–10.3)
Chloride: 97 mmol/L — ABNORMAL LOW (ref 98–111)
Creatinine, Ser: 0.62 mg/dL (ref 0.44–1.00)
GFR calc Af Amer: >60 mL/min (ref >60)
GFR calc non Af Amer: >60 mL/min (ref >60)
Glucose, Bld: 88 mg/dL (ref 70–99)
Potassium: 3.2 mmol/L — ABNORMAL LOW (ref 3.5–5.1)
Sodium: 138 mmol/L (ref 135–145)

## 2020-03-02 LAB — CBC WITH DIFFERENTIAL/PLATELET
Abs Immature Granulocytes: 0.78 10*3/uL — ABNORMAL HIGH (ref 0.00–0.07)
Basophils Absolute: 0 10*3/uL (ref 0.0–0.1)
Basophils Relative: 0 %
Eosinophils Absolute: 0.3 10*3/uL (ref 0.0–0.5)
Eosinophils Relative: 2 %
HCT: 24.4 % — ABNORMAL LOW (ref 36.0–46.0)
Hemoglobin: 7.4 g/dL — ABNORMAL LOW (ref 12.0–15.0)
Immature Granulocytes: 5 %
Lymphocytes Relative: 24 %
Lymphs Abs: 3.7 10*3/uL (ref 0.7–4.0)
MCH: 23.1 pg — ABNORMAL LOW (ref 26.0–34.0)
MCHC: 30.3 g/dL (ref 30.0–36.0)
MCV: 76.3 fL — ABNORMAL LOW (ref 80.0–100.0)
Monocytes Absolute: 1.3 10*3/uL — ABNORMAL HIGH (ref 0.1–1.0)
Monocytes Relative: 8 %
Neutro Abs: 9.5 10*3/uL — ABNORMAL HIGH (ref 1.7–7.7)
Neutrophils Relative %: 61 %
Platelets: 378 10*3/uL (ref 150–400)
RBC: 3.2 MIL/uL — ABNORMAL LOW (ref 3.87–5.11)
RDW: 20.8 % — ABNORMAL HIGH (ref 11.5–15.5)
WBC: 15.6 10*3/uL — ABNORMAL HIGH (ref 4.0–10.5)
nRBC: 0.5 % — ABNORMAL HIGH (ref 0.0–0.2)

## 2020-03-02 LAB — HEPATIC FUNCTION PANEL
ALT: 16 U/L (ref 0–44)
AST: 9 U/L — ABNORMAL LOW (ref 15–41)
Albumin: 1.6 g/dL — ABNORMAL LOW (ref 3.5–5.0)
Alkaline Phosphatase: 89 U/L (ref 38–126)
Bilirubin, Direct: 0.1 mg/dL (ref 0.0–0.2)
Indirect Bilirubin: 0.9 mg/dL (ref 0.3–0.9)
Total Bilirubin: 1 mg/dL (ref 0.3–1.2)
Total Protein: 4.8 g/dL — ABNORMAL LOW (ref 6.5–8.1)

## 2020-03-02 LAB — GLUCOSE, CAPILLARY
Glucose-Capillary: 75 mg/dL (ref 70–99)
Glucose-Capillary: 90 mg/dL (ref 70–99)
Glucose-Capillary: 270 mg/dL — ABNORMAL HIGH (ref 70–99)
Glucose-Capillary: 125 mg/dL — ABNORMAL HIGH (ref 70–99)

## 2020-03-02 LAB — C-REACTIVE PROTEIN: CRP: 21.8 mg/dL — ABNORMAL HIGH (ref <1.0)

## 2020-03-02 LAB — SEDIMENTATION RATE: Sed Rate: 110 mm/hr — ABNORMAL HIGH (ref 0–22)

## 2020-03-02 MED ORDER — CEFAZOLIN SODIUM-DEXTROSE 2-4 GM/100ML-% IV SOLN
2.0000 g | Freq: Three times a day (TID) | INTRAVENOUS | Status: DC
  Administered 2020-03-02 – 2020-03-07 (×16): 2 g via INTRAVENOUS
  Filled 2020-03-02 (×17): qty 100

## 2020-03-02 NOTE — Progress Notes (Signed)
[facility] for Infectious Disease  Date of Admission:  02/26/2020      Total days of antibiotics 4  Day 4 Nafcillin / Rifampin          ASSESSMENT: [name] is a 59 y.o. 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 She has been afebrile on Nafcillin/Rifampin. WBC coming down but still a bit elevated 15K. TEE has ruled out any endocarditis. Will change to Cefazolin to cover MSSA with anticipation of SNF to prevent needing constant IV hook up (fall risk).  She is tolerating Rifampin PO well without any side effects.  Discussed PICC line - answered all questions for her. Can place Friday.    PLAN: 1. Change to Cefazolin IV 2. Continue Rifampin PO 3. PICC Friday 5.18 barring cultures remain negative.  4. Wound care per Dr. Christella Noa 5. OPAT orders below.    OPAT ORDERS:  Diagnosis: Vertebral infection with HW, secondary bacteremia   Culture Result: MSSA  Allergies  Allergen Reactions  . Monosodium Glutamate Anaphylaxis and Swelling    Eyes swollen shut, facial swelling, tongue swelling.  . Shellfish Allergy Anaphylaxis  . Celebrex [Celecoxib] Itching    Only allergic to generic brand  . Contrast Media [Iodinated Diagnostic Agents] Itching and Nausea Only    "could not walk"  . Diclofenac Itching    Can take the name brand Voltaren  . Other Other (See Comments)    Pet dander, Mildew, Mold  Cannot tolerate generic DICLOFENAC GEL--MUST USE BRAND NAME ([place])   . Latex Itching and Rash    Latex glove with powder     Discharge antibiotics to be given via PICC line: Cefazolin 2 gm IV q8h   + Rifampin PO BID  Duration: 42 days   End Date: July 6   [place] Per Protocol with [employer] Use: Home health RN for IV administration and teaching, line care and labs.    Labs weekly while on IV antibiotics: _x_ CBC with differential __ BMP _x_ CMP _x_ CRP _x_  ESR __ Vancomycin trough __ CK  _x_ Please pull PIC at completion of IV antibiotics __ Please leave PIC in place until doctor has seen patient or been notified  Fax weekly labs to [phone]  Clinic Follow Up Appt: 3 weeks 6/10 @ 11:15 w/ Janene Madeira, NP     Principal Problem:   MSSA bacteremia Active Problems:   Postoperative wound infection   Abnormal gait due to muscle weakness   . aspirin EC  81 mg Oral Daily  . atenolol  50 mg Oral Daily  . calcium citrate  200 mg of elemental calcium Oral Daily   And  . cholecalciferol  400 Units Oral Daily  . celecoxib  200 mg Oral BID  . chlorzoxazone  500 mg Oral TID  . docusate sodium  100 mg Oral BID  . furosemide  40 mg Oral Daily  . gabapentin  1,200 mg Oral TID  . heparin  5,000 Units Subcutaneous Q8H  . hydrochlorothiazide  25 mg Oral Daily  . insulin aspart  0-15 Units Subcutaneous TID WC  . insulin aspart  0-5 Units Subcutaneous QHS  . lidocaine-EPINEPHrine  1.7 mL Intradermal Once  . lubiprostone  24 mcg Oral BID WC  . multivitamin with minerals  1 tablet Oral Daily  . oxyCODONE  15 mg Oral Q12H  . pantoprazole  40  mg Oral Daily  . rifampin  300 mg Oral BID WC  . senna  1 tablet Oral BID  . sodium chloride flush  3 mL Intravenous Q12H  . sodium chloride flush  3 mL Intravenous Q12H    SUBJECTIVE: Much discussion about benadryl and other PTA medications today. Feeling better. She has made her decision to go to SNF and I believe will be going to [facility].  No fevers/chills. No side effects to antibiotics.    Review of Systems: Review of Systems  All other systems reviewed and are negative.   Allergies  Allergen Reactions  . Monosodium Glutamate Anaphylaxis and Swelling    Eyes swollen shut, facial swelling, tongue swelling.  . Shellfish Allergy Anaphylaxis  . Celebrex [Celecoxib] Itching    Only allergic to generic brand  . Contrast Media [Iodinated Diagnostic Agents] Itching and Nausea Only     "could not walk"  . Diclofenac Itching    Can take the name brand Voltaren  . Other Other (See Comments)    Pet dander, Mildew, Mold  Cannot tolerate generic DICLOFENAC GEL--MUST USE BRAND NAME ([place])   . Latex Itching and Rash    Latex glove with powder    OBJECTIVE: Vitals:   03/01/20 2315 03/01/20 2351 03/02/20 0425 03/02/20 0749  BP: 106/74   125/76  Pulse: 82   78  Resp: 20   18  Temp:  97.6 F (36.4 C) 98.6 F (37 C) 98.9 F (37.2 C)  TempSrc:  Oral Oral Axillary  SpO2: 97%   99%   There is no height or weight on file to calculate BMI.   Physical Exam Constitutional:      Comments: Resting comfortably in bed. No distress.   Cardiovascular:     Rate and Rhythm: Normal rate.  Pulmonary:     Effort: Pulmonary effort is normal. No respiratory distress.  Abdominal:     General: There is no distension.     Palpations: Abdomen is soft.     Tenderness: There is no abdominal tenderness.  Musculoskeletal:     Comments: Wound vac with thin red drainage in cannister.   Skin:    General: Skin is warm and dry.     Capillary Refill: Capillary refill takes less than 2 seconds.  Neurological:     Mental Status: She is alert and oriented to person, place, and time.     Lab Results Lab Results  Component Value Date   WBC 15.6 (H) 03/02/2020   HGB 7.4 (L) 03/02/2020   HCT 24.4 (L) 03/02/2020   MCV 76.3 (L) 03/02/2020   PLT 378 03/02/2020    Lab Results  Component Value Date   CREATININE 0.62 03/02/2020   BUN 10 03/02/2020   NA 138 03/02/2020   K 3.2 (L) 03/02/2020   CL 97 (L) 03/02/2020   CO2 26 03/02/2020    Lab Results  Component Value Date   ALT 16 03/02/2020   AST 9 (L) 03/02/2020   ALKPHOS 89 03/02/2020   BILITOT 1.0 03/02/2020     Microbiology: Recent Results (from the past 240 hour(s))  SARS Coronavirus 2 by RT PCR (hospital order, performed in [facility] hospital lab) Nasopharyngeal Nasopharyngeal Swab     Status: None    Collection Time: 02/26/20  8:07 PM   Specimen: Nasopharyngeal Swab  Result Value Ref Range Status   SARS Coronavirus 2 NEGATIVE NEGATIVE Final    Comment: (NOTE) SARS-CoV-2 target nucleic acids are NOT DETECTED. The  SARS-CoV-2 RNA is generally detectable in upper and lower respiratory specimens during the acute phase of infection. The lowest concentration of SARS-CoV-2 viral copies this assay can detect is 250 copies / mL. A negative result does not preclude SARS-CoV-2 infection and should not be used as the sole basis for treatment or other patient management decisions.  A negative result may occur with improper specimen collection / handling, submission of specimen other than nasopharyngeal swab, presence of viral mutation(s) within the areas targeted by this assay, and inadequate number of viral copies (<250 copies / mL). A negative result must be combined with clinical observations, patient history, and epidemiological information. Fact Sheet for Patients:   StrictlyIdeas.no Fact Sheet for Healthcare Providers: BankingDealers.co.za This test is not yet approved or cleared  by the [place] FDA and has been authorized for detection and/or diagnosis of SARS-CoV-2 by FDA under an Emergency Use Authorization (EUA).  This EUA will remain in effect (meaning this test can be used) for the duration of the COVID-19 declaration under Section 564(b)(1) of the Act, 21 U.S.C. section 360bbb-3(b)(1), unless the authorization is terminated or revoked sooner. Performed at [facility], [place] [address], [place], [place] [address]   Blood Culture (routine x 2)     Status: Abnormal   Collection Time: 02/26/20 10:50 PM   Specimen: BLOOD  Result Value Ref Range Status   Specimen Description BLOOD LEFT ANTECUBITAL  Final   Special Requests   Final    BOTTLES DRAWN AEROBIC AND ANAEROBIC Blood Culture adequate volume   Culture  Setup Time    Final    GRAM POSITIVE COCCI IN CLUSTERS AEROBIC BOTTLE ONLY Organism ID to follow CRITICAL RESULT CALLED TO, READ BACK BY AND VERIFIED WITH: Hughie Closs PHARMD, AT 1914 02/27/20 BY Rush Landmark Performed at [facility], [place] [address], [place], [place] [address]    Culture STAPHYLOCOCCUS AUREUS (A)  Final   Report Status 02/29/2020 FINAL  Final   Organism ID, Bacteria STAPHYLOCOCCUS AUREUS  Final      Susceptibility   Staphylococcus aureus - MIC*    CIPROFLOXACIN <=0.5 SENSITIVE Sensitive     ERYTHROMYCIN <=0.25 SENSITIVE Sensitive     GENTAMICIN <=0.5 SENSITIVE Sensitive     OXACILLIN 0.5 SENSITIVE Sensitive     TETRACYCLINE <=1 SENSITIVE Sensitive     VANCOMYCIN <=0.5 SENSITIVE Sensitive     TRIMETH/SULFA <=10 SENSITIVE Sensitive     CLINDAMYCIN <=0.25 SENSITIVE Sensitive     RIFAMPIN <=0.5 SENSITIVE Sensitive     Inducible Clindamycin NEGATIVE Sensitive     * STAPHYLOCOCCUS AUREUS  Blood Culture ID Panel (Reflexed)     Status: Abnormal   Collection Time: 02/26/20 10:50 PM  Result Value Ref Range Status   Enterococcus species NOT DETECTED NOT DETECTED Final   Listeria monocytogenes NOT DETECTED NOT DETECTED Final   Staphylococcus species DETECTED (A) NOT DETECTED Final    Comment: M. Highland, AT 1615 02/27/20 BY D. VANHOOK   Staphylococcus aureus (BCID) DETECTED (A) NOT DETECTED Final    Comment: Methicillin (oxacillin) susceptible Staphylococcus aureus (MSSA). Preferred therapy is anti staphylococcal beta lactam antibiotic (Cefazolin or Nafcillin), unless clinically contraindicated. CRITICAL RESULT CALLED TO, READ BACK BY AND VERIFIED WITH: Hughie Closs PHARMD, AT 1615 02/27/20 BY D. VANHOOK    Methicillin resistance NOT DETECTED NOT DETECTED Final   Streptococcus species NOT DETECTED NOT DETECTED Final   Streptococcus agalactiae NOT DETECTED NOT DETECTED Final   Streptococcus pneumoniae NOT DETECTED NOT DETECTED  Final   Streptococcus pyogenes NOT DETECTED NOT DETECTED  Final   Acinetobacter baumannii NOT DETECTED NOT DETECTED Final   Enterobacteriaceae species NOT DETECTED NOT DETECTED Final   Enterobacter cloacae complex NOT DETECTED NOT DETECTED Final   Escherichia coli NOT DETECTED NOT DETECTED Final   Klebsiella oxytoca NOT DETECTED NOT DETECTED Final   Klebsiella pneumoniae NOT DETECTED NOT DETECTED Final   Proteus species NOT DETECTED NOT DETECTED Final   Serratia marcescens NOT DETECTED NOT DETECTED Final   Haemophilus influenzae NOT DETECTED NOT DETECTED Final   Neisseria meningitidis NOT DETECTED NOT DETECTED Final   Pseudomonas aeruginosa NOT DETECTED NOT DETECTED Final   Candida albicans NOT DETECTED NOT DETECTED Final   Candida glabrata NOT DETECTED NOT DETECTED Final   Candida krusei NOT DETECTED NOT DETECTED Final   Candida parapsilosis NOT DETECTED NOT DETECTED Final   Candida tropicalis NOT DETECTED NOT DETECTED Final    Comment: Performed at [facility], [place] [address] [place], [place] [address]  Blood Culture (routine x 2)     Status: Abnormal   Collection Time: 02/26/20 11:00 PM   Specimen: BLOOD RIGHT HAND  Result Value Ref Range Status   Specimen Description BLOOD RIGHT HAND  Final   Special Requests   Final    BOTTLES DRAWN AEROBIC ONLY Blood Culture results may not be optimal due to an inadequate volume of blood received in culture bottles   Culture  Setup Time   Final    GRAM POSITIVE COCCI IN CLUSTERS AEROBIC BOTTLE ONLY CRITICAL VALUE NOTED.  VALUE IS CONSISTENT WITH PREVIOUSLY REPORTED AND CALLED VALUE. Performed at [facility], [place] [address] [place], [place] [address]    Culture STAPHYLOCOCCUS AUREUS (A)  Final   Report Status 02/29/2020 FINAL  Final  Aerobic/Anaerobic Culture (surgical/deep wound)     Status: None (Preliminary result)   Collection Time: 02/27/20 10:11 PM   Specimen: Wound  Result Value Ref Range Status   Specimen Description WOUND THORACIC  Final   Special Requests SPECIMEN A, PT  ON NAFCILIN, RIFAMPIN  Final   Gram Stain   Final    RARE WBC PRESENT, PREDOMINANTLY PMN NO ORGANISMS SEEN Performed at [facility], [place] [address], [place], [place] [address]    Culture   Final    FEW STAPHYLOCOCCUS AUREUS NO ANAEROBES ISOLATED; CULTURE IN PROGRESS FOR 5 DAYS    Report Status PENDING  Incomplete   Organism ID, Bacteria STAPHYLOCOCCUS AUREUS  Final      Susceptibility   Staphylococcus aureus - MIC*    CIPROFLOXACIN <=0.5 SENSITIVE Sensitive     ERYTHROMYCIN <=0.25 SENSITIVE Sensitive     GENTAMICIN <=0.5 SENSITIVE Sensitive     OXACILLIN 0.5 SENSITIVE Sensitive     TETRACYCLINE <=1 SENSITIVE Sensitive     VANCOMYCIN <=0.5 SENSITIVE Sensitive     TRIMETH/SULFA <=10 SENSITIVE Sensitive     CLINDAMYCIN <=0.25 SENSITIVE Sensitive     RIFAMPIN <=0.5 SENSITIVE Sensitive     Inducible Clindamycin NEGATIVE Sensitive     * FEW STAPHYLOCOCCUS AUREUS  Aerobic/Anaerobic Culture (surgical/deep wound)     Status: None (Preliminary result)   Collection Time: 02/27/20 10:16 PM   Specimen: Wound  Result Value Ref Range Status   Specimen Description WOUND BACK  Final   Special Requests SPECIMEN B,PT ON NAFCILIN, RIFAMPIN,  Final   Gram Stain   Final    MODERATE WBC PRESENT, PREDOMINANTLY PMN FEW GRAM POSITIVE COCCI Performed at [facility], [place] [address], [place], [place] [address]    Culture   Final    MODERATE STAPHYLOCOCCUS AUREUS NO ANAEROBES ISOLATED; CULTURE IN PROGRESS FOR 5 DAYS    Report Status PENDING  Incomplete   Organism ID, Bacteria STAPHYLOCOCCUS AUREUS  Final      Susceptibility   Staphylococcus aureus - MIC*    CIPROFLOXACIN <=0.5 SENSITIVE Sensitive     ERYTHROMYCIN <=0.25 SENSITIVE Sensitive     GENTAMICIN <=0.5 SENSITIVE Sensitive     OXACILLIN 0.5 SENSITIVE Sensitive     TETRACYCLINE <=1 SENSITIVE Sensitive     VANCOMYCIN <=0.5 SENSITIVE Sensitive     TRIMETH/SULFA <=10 SENSITIVE Sensitive     CLINDAMYCIN <=0.25 SENSITIVE Sensitive      RIFAMPIN <=0.5 SENSITIVE Sensitive     Inducible Clindamycin NEGATIVE Sensitive     * MODERATE STAPHYLOCOCCUS AUREUS  Culture, blood (Routine X 2) w Reflex to ID Panel     Status: None (Preliminary result)   Collection Time: 02/29/20 11:01 AM   Specimen: BLOOD  Result Value Ref Range Status   Specimen Description BLOOD LEFT ANTECUBITAL  Final   Special Requests   Final    BOTTLES DRAWN AEROBIC ONLY Blood Culture adequate volume   Culture   Final    NO GROWTH 2 DAYS Performed at [facility], [address], [place], [place] [address]    Report Status PENDING  Incomplete  Culture, blood (Routine X 2) w Reflex to ID Panel     Status: None (Preliminary result)   Collection Time: 02/29/20  1:20 PM   Specimen: BLOOD LEFT ARM  Result Value Ref Range Status   Specimen Description BLOOD LEFT ARM  Final   Special Requests   Final    BOTTLES DRAWN AEROBIC ONLY Blood Culture adequate volume   Culture   Final    NO GROWTH 2 DAYS Performed at [facility], [address] [place], [place] [address]    Report Status PENDING  Incomplete    Janene Madeira, MSN, NP-C [place] for Infectious Disease [email or web address] Pager: [phone] Office: [phone] [place]: [phone]

## 2020-03-02 NOTE — TOC Progression Note (Signed)
Transition of Care [facility]) - Progression Note    Patient Details  Name: ANJOLIE MAJER MRN: [id number] Date of Birth: Jun 17, 1961  [facility] CM/SW Contact  Eduard Roux, [place] Phone Number: 03/02/2020, 10:11 AM  Clinical Narrative:     CSW visit with patient at bedside. CSW provided patient with SNF bed offers and medicare.gov SNF listing. Patient express she is confident that she will progress and "be able to go home". Patient states she can work better with PT once she has been given pain medication. "I want to participate with PT but I need to get pain medication first and allow it time to get into my system". Patient states her preference is to go home with [facility]. Patient states is active with [facility] and receives PT at home.  Patient states she wants SNF as back up plan.    Antony Blackbird, MSW, LCSWA Clinical Social Worker    Expected Discharge Plan: Skilled Nursing Facility Barriers to Discharge: Continued Medical Work up  Expected Discharge Plan and Services Expected Discharge Plan: Skilled Nursing Facility In-house Referral: Clinical Social Work     Living arrangements for the past 2 months: Single Family Home                                       Social Determinants of Health (SDOH) Interventions    Readmission Risk Interventions No flowsheet data found.

## 2020-03-02 NOTE — Progress Notes (Signed)
PHARMACY CONSULT NOTE FOR:  OUTPATIENT  PARENTERAL ANTIBIOTIC THERAPY (OPAT)  Indication: MSSA bacteremia/ lumbar infection Regimen: Cefazolin 2g IV Q8h End date: 04/11/2020  IV antibiotic discharge orders are pended. To discharging provider:  please sign these orders via discharge navigator,  Select New Orders & click on the button choice - Manage This Unsigned Work.     Thank you for allowing pharmacy to be a part of this patient's care.  Cherlyn Cushing, PharmD 03/02/2020, 11:45 AM

## 2020-03-02 NOTE — TOC Progression Note (Signed)
Transition of Care [facility]) - Progression Note    Patient Details  Name: [name] MRN: [id number] Date of Birth: 11-04-1960  Transition of Care [facility]) CM/SW Contact  Eduard Roux, [place] Phone Number: 03/02/2020, 10:09 AM  Clinical Narrative:     CSW called patient's son, Despina Hick, left voice message.  Antony Blackbird, MSW, LCSWA Clinical Social Worker   Expected Discharge Plan: Skilled Nursing Facility Barriers to Discharge: Continued Medical Work up  Expected Discharge Plan and Services Expected Discharge Plan: Skilled Nursing Facility In-house Referral: Clinical Social Work     Living arrangements for the past 2 months: Single Family Home                                       Social Determinants of Health (SDOH) Interventions    Readmission Risk Interventions No flowsheet data found.

## 2020-03-02 NOTE — Progress Notes (Signed)
Patient ID: [name], female   DOB: 1961/04/24, 59 y.o.   MRN: [id number] BP 115/61 (BP Location: Left Arm)   Pulse 86   Temp 99.6 F (37.6 C) (Oral)   Resp (!) 22   LMP 02/18/2016   SpO2 96%  Alert oriented x 4, speech is clear and fluent Moving lower extremities Still complains of pain thus not full cooperation with therapies She is not a candidate for home health. I will not be discharging her to home health. Her hygiene is sufficiently poor that I believe home at this time would be detrimental to her health.  Wound vac in place.

## 2020-03-02 NOTE — Progress Notes (Signed)
Physical Therapy Treatment Patient Details Name: [name] MRN: [id number] DOB: May 18, 1961 Today's Date: 03/02/2020    History of Present Illness [name] is a 59 y.o. female who presents to the ED today via EMS due to post operative problem. Pt had posterior lumbar fusion L2-3 on 05/12 by Dr. Franky Macho. Discharged on 05/18. Pt under I&D of back incision with wound vac placement on 5/23. PSH: bilat TKA PMH: fibromyalgia, HTN, hyperthroidism, IBS, tyroid goiter.    PT Comments    Pt requiring significant assistance for all mobility this session and is limited by back pain. Pt requires mod-maxA for bed mobility and transfers at this time. Per nurse tech report pt transferred form bed to recliner with limited physical assistance requirements and was able to take steps to recliner earlier on this date, demonstrating very inconsistent performance with mobility. Pt will continue to benefit from acute PT POC to reduce falls risk and caregiver burden. Pt continues to recommend SNF placement at the time of discharge.   Follow Up Recommendations  SNF;Supervision/Assistance - 24 hour     Equipment Recommendations  Wheelchair (measurements PT);Wheelchair cushion (measurements PT);Hospital bed(mechanical lift)    Recommendations for Other Services       Precautions / Restrictions Precautions Precautions: Back;Fall Precaution Booklet Issued: No Precaution Comments: wound vac, PT with limited recall of back precautions, PT providing frequent cues to reinforce no bending Restrictions Weight Bearing Restrictions: No    Mobility  Bed Mobility Overal bed mobility: Needs Assistance Bed Mobility: Rolling;Sit to Sidelying Rolling: Mod assist       Sit to sidelying: Mod assist General bed mobility comments: pt requiring assistance for LE management  Transfers Overall transfer level: Needs assistance Equipment used: Rolling walker (2 wheeled) Transfers: Sit to/from [employer]  Transfers Sit to Stand: Max assist(maxA x2) Stand pivot transfers: Total assist(STEDY)       General transfer comment: pt requiring significant physical assistance to power up to standing both from recliner and from seat of STEDY. PT provides cues for hand placement and for back precautions as pt attempting to stand with significant trunk flexion multiple times during session.  Ambulation/Gait Ambulation/Gait assistance: (unable to attempt 2/2 weakness)               Stairs             Wheelchair Mobility    Modified Rankin (Stroke Patients Only)       Balance Overall balance assessment: Needs assistance Sitting-balance support: Bilateral upper extremity supported;Feet supported Sitting balance-[name]: Fair Sitting balance - Comments: close supervision sitting at edge of recliner   Standing balance support: Bilateral upper extremity supported Standing balance-[name]: Zero Standing balance comment: mod-maxA to maintain standing balance with BUE support of RW or STEDY                            Cognition Arousal/Alertness: Awake/alert Behavior During Therapy: WFL for tasks assessed/performed Overall Cognitive Status: Within Functional Limits for tasks assessed                                        Exercises General Exercises - Lower Extremity Ankle Circles/Pumps: AROM;Both;5 reps Gluteal Sets: AROM;Both;5 reps Straight Leg Raises: (PT encourages SLR in bed with limited ROM)    General Comments General comments (skin integrity, edema, etc.): wound vac in  place on back, VSS on RA      Pertinent Vitals/Pain Pain Assessment: Faces Faces Pain Scale: Hurts worst Pain Location: back Pain Descriptors / Indicators: Burning Pain Intervention(s): Monitored during session;Premedicated before session    Home Living                      Prior Function            PT Goals (current goals can now be found in the  care plan section) Acute Rehab PT Goals Patient Stated Goal: decrease the pain Progress towards PT goals: Not progressing toward goals - comment(pt requiring increased assistance)    Frequency    Min 5X/week      PT Plan Current plan remains appropriate    Co-evaluation              AM-PAC PT "6 Clicks" Mobility   Outcome Measure  Help needed turning from your back to your side while in a flat bed without using bedrails?: A Lot Help needed moving from lying on your back to sitting on the side of a flat bed without using bedrails?: A Lot Help needed moving to and from a bed to a chair (including a wheelchair)?: Total Help needed standing up from a chair using your arms (e.g., wheelchair or bedside chair)?: Total Help needed to walk in hospital room?: Total Help needed climbing 3-5 steps with a railing? : Total 6 Click Score: 8    End of Session   Activity Tolerance: Patient limited by pain Patient left: in bed;with call bell/phone within reach;with bed alarm set;with nursing/sitter in room Nurse Communication: Mobility status;Need for lift equipment PT Visit Diagnosis: Unsteadiness on feet (R26.81);History of falling (Z91.81);Pain Pain - part of body: (back)     Time: 8099-8338 PT Time Calculation (min) (ACUTE ONLY): 44 min  Charges:  $Therapeutic Activity: 23-37 mins                     Zenaida Niece, PT, DPT Acute Rehabilitation Pager: [phone]    Zenaida Niece 03/02/2020, 2:35 PM

## 2020-03-03 LAB — AEROBIC/ANAEROBIC CULTURE W GRAM STAIN (SURGICAL/DEEP WOUND)

## 2020-03-03 LAB — GLUCOSE, CAPILLARY
Glucose-Capillary: 101 mg/dL — ABNORMAL HIGH (ref 70–99)
Glucose-Capillary: 103 mg/dL — ABNORMAL HIGH (ref 70–99)
Glucose-Capillary: 76 mg/dL (ref 70–99)
Glucose-Capillary: 121 mg/dL — ABNORMAL HIGH (ref 70–99)

## 2020-03-03 MED ORDER — CEFAZOLIN IV (FOR PTA / DISCHARGE USE ONLY)
2.0000 g | Freq: Three times a day (TID) | INTRAVENOUS | 0 refills | Status: DC
Start: 2020-03-03 — End: 2020-03-24

## 2020-03-03 MED ORDER — SODIUM CHLORIDE 0.9% FLUSH: 10.0000 mL | INTRAVENOUS | Status: DC | PRN

## 2020-03-03 MED ORDER — RIFAMPIN 300 MG PO CAPS
300.0000 mg | ORAL_CAPSULE | Freq: Two times a day (BID) | ORAL | 0 refills | Status: DC
Start: 2020-03-03 — End: 2020-03-24

## 2020-03-03 MED ORDER — CHLORHEXIDINE GLUCONATE CLOTH 2 % EX PADS
6.0000 | MEDICATED_PAD | Freq: Every day | CUTANEOUS | Status: DC
  Administered 2020-03-03 – 2020-03-07 (×6): 6 via TOPICAL

## 2020-03-03 NOTE — Discharge Summary (Signed)
Physician Discharge Summary  Patient ID: [name] MRN: [id number] DOB/AGE: 1961-03-02 59 y.o.  Admit date: 02/26/2020 Discharge date: 03/03/2020  Admission Diagnoses:wound infection  Discharge Diagnoses:  Principal Problem:   MSSA bacteremia Active Problems:   Abnormal gait due to muscle weakness   Postoperative infection   Discharged Condition: good  Hospital Course: [name] was admitted, found to have positive blood cultures for MSSA. She was taken to the operating room for an open debridement. Both a thoracic incision for a disc herniation and lumbar incision for a lumbar discetomy and arthrodesis were opened. Both were purulent. I placed a wound vac at the end of the case. She has a Picc line, placed after blood cultures were deemed negative after starting nafcillin and rifampin. The wound vac is in place at discharge. She is moving the lower extremities, and is continent. The wounds both have a good base of healthy appearing tissue.   Treatments: surgery: as above  Discharge Exam: Blood pressure (!) 157/76, pulse 81, temperature 99.5 F (37.5 C), temperature source Oral, resp. rate 17, last menstrual period 02/18/2016, SpO2 100 %. General appearance: alert, cooperative, appears older than stated age and morbidly obese  Disposition:  There are no questions and answers to display.       [facility] Discharge Instructions    [facility] pharmacist to adjust dose for Vancomycin, Aminoglycosides and other anti-infective therapies as requested by physician.   Complete by: As directed    [facility] to provide Cath Flo 46m   Complete by: As directed    Administer for PICC line occlusion and as ordered by physician for other access device issues.   Anaphylaxis Kit: Provided to treat any anaphylactic reaction to the medication being provided to the patient if First Dose or when requested by physician   Complete by: As directed    Epinephrine 151mml  vial / amp: Administer 0.31m46m0.31ml62mubcutaneously once for moderate to severe anaphylaxis, nurse to call physician and pharmacy when reaction occurs and call 911 if needed for immediate care   Diphenhydramine 50mg11mIV vial: Administer 25-50mg 68mM PRN for first dose reaction, rash, itching, mild reaction, nurse to call physician and pharmacy when reaction occurs   Sodium Chloride 0.9% NS 500ml I231mdminister if needed for hypovolemic blood pressure drop or as ordered by physician after call to physician with anaphylactic reaction   Change dressing on IV access line weekly and PRN   Complete by: As directed    Flush IV access with Sodium Chloride 0.9% and Heparin 10 units/ml or 100 units/ml   Complete by: As directed    Home infusion instructions - [facility]   Complete by: As directed    Instructions: Flush IV access with Sodium Chloride 0.9% and Heparin 10units/ml or 100units/ml   Change dressing on IV access line: Weekly and PRN   Instructions Cath Flo 2mg: Ad52mister for PICC Line occlusion and as ordered by physician for other access device   [facility] pharmacist to adjust dose for: Vancomycin, Aminoglycosides and other anti-infective therapies as requested by physician   Method of administration may be changed at the discretion of home infusion pharmacist based upon assessment of the patient and/or caregiver's ability to self-administer the medication ordered   Complete by: As directed      Allergies as of 03/03/2020      Reactions   Monosodium Glutamate Anaphylaxis, Swelling   Eyes swollen shut, facial swelling, tongue swelling.   Shellfish  Allergy Anaphylaxis   Celebrex [celecoxib] Itching   Only allergic to generic brand   Contrast Media [iodinated Diagnostic Agents] Itching, Nausea Only   "could not walk"   Diclofenac Itching   Can take the name brand Voltaren   Other Other (See Comments)   Pet dander, Mildew, Mold Cannot tolerate generic DICLOFENAC  GEL--MUST USE BRAND NAME (GENERIC CAUSES ITCHING)   Latex Itching, Rash   Latex glove with powder      Medication List    TAKE these medications   albuterol 108 (90 Base) MCG/ACT inhaler Commonly known as: VENTOLIN HFA Inhale 2 puffs into the lungs every 6 (six) hours as needed for wheezing or shortness of breath.   Amitiza 24 MCG capsule Generic drug: lubiprostone Take 24 mcg by mouth 2 (two) times daily with a meal.   aspirin EC 81 MG tablet Take 81 mg by mouth daily.   atenolol 50 MG tablet Commonly known as: TENORMIN Take 50 mg by mouth daily.   CALCIUM-VITAMIN D PO Take 1 tablet by mouth 2 (two) times daily.   ceFAZolin  IVPB Commonly known as: ANCEF Inject 2 g into the vein every 8 (eight) hours. Indication:  MSSA bacteremia/ lumbar infection First Dose: Yes Last Day of Therapy:  04/11/2020 Labs - Once weekly:  CBC/D and CMP, Labs - Every other week:  ESR and CRP Method of administration: IV Push Method of administration may be changed at the discretion of home infusion pharmacist based upon assessment of the patient and/or caregiver's ability to self-administer the medication ordered.   CeleBREX 200 MG capsule Generic drug: celecoxib Take 200 mg by mouth 2 (two) times daily.   chlorzoxazone 500 MG tablet Commonly known as: PARAFON Take 500 mg by mouth 3 (three) times daily.   cyclobenzaprine 10 MG tablet Commonly known as: FLEXERIL Take 10 mg by mouth 3 (three) times daily.   diazepam 5 MG tablet Commonly known as: VALIUM Take 5 mg by mouth 2 (two) times daily.   diclofenac sodium 1 % Gel Commonly known as: VOLTAREN Apply 1 application topically 2 (two) times daily as needed (shoulder, arm and leg pain). For right shoulder and arm and legs What changed:   how much to take  when to take this  additional instructions   diphenhydrAMINE 25 MG tablet Commonly known as: BENADRYL Take 50 mg by mouth every 6 (six) hours as needed for allergies.    diphenhydramine-acetaminophen 25-500 MG Tabs tablet Commonly known as: TYLENOL PM Take 2 tablets by mouth at bedtime as needed (sleep/pain).   furosemide 20 MG tablet Commonly known as: LASIX Take 40 mg by mouth daily.   gabapentin 600 MG tablet Commonly known as: NEURONTIN Take 1,200 mg by mouth 3 (three) times daily.   hydrochlorothiazide 25 MG tablet Commonly known as: HYDRODIURIL Take 1 tablet (25 mg total) by mouth daily. One tablet by mouth in the morning. What changed: additional instructions   hydrOXYzine 50 MG tablet Commonly known as: ATARAX/VISTARIL Take 50 mg by mouth 2 (two) times daily.   methocarbamol 500 MG tablet Commonly known as: ROBAXIN Take 500 mg by mouth 4 (four) times daily.   multivitamin with minerals Tabs tablet Take 1 tablet by mouth daily.   omeprazole 20 MG capsule Commonly known as: PRILOSEC Take 20 mg by mouth 2 (two) times daily before a meal.   OVER THE COUNTER MEDICATION Take 1 tablet by mouth daily. Neuriva Brain Supplement   oxyCODONE-acetaminophen 10-325 MG tablet Commonly known as: Percocet  1 tablet if needed for pain. What changed:   how much to take  how to take this  when to take this  additional instructions   potassium chloride 10 MEQ tablet Commonly known as: KLOR-CON Take 10 mEq by mouth every other day.   rifampin 300 MG capsule Commonly known as: Rifadin Take 1 capsule (300 mg total) by mouth 2 (two) times daily.   senna 8.6 MG Tabs tablet Commonly known as: SENOKOT Take 1 tablet by mouth 2 (two) times daily.   tiZANidine 4 MG tablet Commonly known as: Zanaflex Take 1 tablet (4 mg total) by mouth every 6 (six) hours as needed for muscle spasms.            Discharge Care Instructions  (From admission, onward)         Start     Ordered   03/03/20 0000  Change dressing on IV access line weekly and PRN  (Home infusion instructions - [facility] )     03/03/20 1653          Follow-up Information    Ashok Pall, MD Follow up in 2 week(s).   Specialty: Neurosurgery Why: please call to make an appointment Contact information: [address] [place] [address] [phone]          Patient is being discharged on 6/1. There are no changes in the plan or physical exam.  Signed: Ashok Pall 03/03/2020, 5:50 PM

## 2020-03-03 NOTE — Discharge Instructions (Signed)
The wounds should be packed MWF of each week. The wound vac will need to be in place for at least 3 weeks. If there is any question about removing the wound vac I need to be contacted immediately at [phone]. Nursing staff should contact me if Temperature is greater than 101.5, if wound becomes red, or if purulent drainage is noted.

## 2020-03-03 NOTE — Progress Notes (Signed)
Physical Therapy Treatment Patient Details Name: [name] MRN: [id number] DOB: [birth year] Today's Date: 03/03/2020    History of Present Illness [name] is a 59 y.o. female who presents to the ED today via EMS due to post operative problem. Pt had posterior lumbar fusion L2-3 on 05/12 by Dr. Christella Noa. Discharged on 05/18. Pt under I&D of back incision with wound vac placement on 5/23. PSH: bilat TKA PMH: fibromyalgia, HTN, hyperthroidism, IBS, tyroid goiter.    PT Comments    Pt remains limited by pain during session despite pre-medication and initiating therapy at time of patient's request. Pt with significant functional weakness in LE and impaired muscular endurance, only able to tolerate short bouts of functional mobility due to pain. PT notes wound vac leak alarm and notifies RN during session. Pt will continue to benefit from PT POC to improve activity tolerance and to reduce caregiver burden. Pt remains at a high falls risk due to LE weakness and is unable to mobilize for household distances due to poor activity tolerance. PT continues to recommend SNF at time of discharge.   Follow Up Recommendations  SNF;Supervision/Assistance - 24 hour     Equipment Recommendations  Wheelchair (measurements PT);Wheelchair cushion (measurements PT);Hospital bed    Recommendations for Other Services       Precautions / Restrictions Precautions Precautions: Back;Fall Precaution Booklet Issued: No Precaution Comments: wound vac Restrictions Weight Bearing Restrictions: No    Mobility  Bed Mobility Overal bed mobility: Needs Assistance Bed Mobility: Rolling;Sidelying to Sit;Sit to Sidelying Rolling: Max assist Sidelying to sit: Mod assist     Sit to sidelying: Mod assist General bed mobility comments: pt insistant on utilizing HOB elevation to perform sidelying to sit  Transfers Overall transfer level: Needs assistance Equipment used: Rolling walker (2  wheeled) Transfers: Sit to/from Stand Sit to Stand: Min assist;From elevated surface         General transfer comment: pt requesting max elevation of bed, almost in standing position prior to leaving edge of bed  Ambulation/Gait Ambulation/Gait assistance: Min assist Gait Distance (Feet): 2 Feet Assistive device: Rolling walker (2 wheeled) Gait Pattern/deviations: Step-to pattern Gait velocity: reduced Gait velocity interpretation: <1.31 ft/sec, indicative of household ambulator General Gait Details: pt with short shuffling steps with reduced foot clearance, taking 2 steps forward and back and then 1 step forward and back   Stairs             Wheelchair Mobility    Modified Rankin (Stroke Patients Only)       Balance Overall balance assessment: Needs assistance Sitting-balance support: Single extremity supported;Feet supported Sitting balance-[name]: Fair Sitting balance - Comments: minG at edge of bed   Standing balance support: Bilateral upper extremity supported Standing balance-[name]: Poor Standing balance comment: minA with BUE support of RW                            Cognition Arousal/Alertness: Awake/alert Behavior During Therapy: WFL for tasks assessed/performed Overall Cognitive Status: Within Functional Limits for tasks assessed                                        Exercises      General Comments General comments (skin integrity, edema, etc.): wound vac with leak alarm on system during mobility, RN made aware, dressing appears to be pulling  up slightly from bottom leading to leak in negative pressure.       Pertinent Vitals/Pain Pain Assessment: Faces Faces Pain Scale: Hurts worst Pain Location: back Pain Descriptors / Indicators: Burning Pain Intervention(s): Monitored during session    Home Living                      Prior Function            PT Goals (current goals can now be found  in the care plan section) Acute Rehab PT Goals Patient Stated Goal: decrease the pain Progress towards PT goals: Progressing toward goals(very slowly, limited by pain/weakness)    Frequency    Min 5X/week      PT Plan Current plan remains appropriate    Co-evaluation              AM-PAC PT "6 Clicks" Mobility   Outcome Measure  Help needed turning from your back to your side while in a flat bed without using bedrails?: Total Help needed moving from lying on your back to sitting on the side of a flat bed without using bedrails?: A Lot Help needed moving to and from a bed to a chair (including a wheelchair)?: Total Help needed standing up from a chair using your arms (e.g., wheelchair or bedside chair)?: A Little(bed elevated) Help needed to walk in hospital room?: Total Help needed climbing 3-5 steps with a railing? : Total 6 Click Score: 9    End of Session   Activity Tolerance: Patient limited by pain Patient left: in bed;with call bell/phone within reach;with bed alarm set Nurse Communication: Mobility status PT Visit Diagnosis: Unsteadiness on feet (R26.81);History of falling (Z91.81);Pain     Time: 7564-3329 PT Time Calculation (min) (ACUTE ONLY): 47 min  Charges:  $Gait Training: 8-22 mins $Therapeutic Activity: 23-37 mins                     Arlyss Gandy, PT, DPT Acute Rehabilitation Pager: [phone]    Arlyss Gandy 03/03/2020, 4:04 PM

## 2020-03-03 NOTE — Progress Notes (Signed)
Patient ID: [name], female   DOB: [birth year], 59 y.o.   MRN: [id number] BP (!) 110/58 (BP Location: Left Arm)   Pulse 73   Temp 98.3 F (36.8 C) (Oral)   Resp 19   LMP 02/18/2016   SpO2 100%  Secure chat note which is not the acknowledged means of communication was acknowledged once I logged on to the Epic system. My office is the official means of communication between myself and the hospital staff. As has been the case for 22 years.  I do expect official means of communication to be used so issues referred to in FNP Sanders note from 03/03/30 can be obviated. If I have erred in my response and secure chat is the new standard then my office has not been contacted, nor I.

## 2020-03-03 NOTE — TOC Progression Note (Signed)
Transition of Care [facility]) - Progression Note    Patient Details  Name: [name] MRN: [id number] Date of Birth: 01-09-1961  [facility] CM/SW Contact  Eduard Roux, [place] Phone Number: 03/03/2020, 11:49 AM  Clinical Narrative:    CSW visit with the patient at bedside. Patient is agreeable to SNF placement and has selected [facility]. CSW spoke with [facility]/SNF & confirmed bed offer. CSW confirmed patient will need wound vac.   CSW spoke with patient's son Despina Hick, and updated on discharge plan.  CSW will start insurance authorization for SNF today.  Antony Blackbird, MSW, LCSWA Clinical Social Worker   Expected Discharge Plan: Skilled Nursing Facility Barriers to Discharge: Continued Medical Work up  Expected Discharge Plan and Services Expected Discharge Plan: Skilled Nursing Facility In-house Referral: Clinical Social Work     Living arrangements for the past 2 months: Single Family Home                                       Social Determinants of Health (SDOH) Interventions    Readmission Risk Interventions No flowsheet data found.

## 2020-03-03 NOTE — Progress Notes (Signed)
Peripherally Inserted Central Catheter Placement  The IV Nurse has discussed with the patient and/or persons authorized to consent for the patient, the purpose of this procedure and the potential benefits and risks involved with this procedure.  The benefits include less needle sticks, lab draws from the catheter, and the patient may be discharged home with the catheter. Risks include, but not limited to, infection, bleeding, blood clot (thrombus formation), and puncture of an artery; nerve damage and irregular heartbeat and possibility to perform a PICC exchange if needed/ordered by physician.  Alternatives to this procedure were also discussed.  Bard Power PICC patient education guide, fact sheet on infection prevention and patient information card has been provided to patient /or left at bedside.    PICC Placement Documentation  PICC Single Lumen 03/03/20 PICC Right Basilic 36 cm 1 cm (Active)  Indication for Insertion or Continuance of Line Home intravenous therapies (PICC only) 03/03/20 0852  Exposed Catheter (cm) 1 cm 03/03/20 0852  Site Assessment Clean;Dry;Intact 03/03/20 0852  Line Status Flushed;Saline locked;Blood return noted 03/03/20 0852  Dressing Type Transparent;Securing device 03/03/20 4580  Dressing Status Clean;Dry;Intact;Antimicrobial disc in place 03/03/20 0852  Dressing Change Due 03/10/20 03/03/20 0852       Romie Jumper 03/03/2020, 8:56 AM

## 2020-03-03 NOTE — TOC Progression Note (Signed)
Transition of Care [facility]) - Progression Note    Patient Details  Name: BERDELLA BACOT MRN: [id number] Date of Birth: [birth year]  Transition of Care [facility]) CM/SW Contact  Lockie Pares, RN Phone Number: 03/03/2020, 2:51 PM  Clinical Narrative:    Admitted with  Bacteremia  Currently has a wound vac to right leg wound, a PICC for ongoing antibiotic therapy. Neurosurgery does not think it prudent nor safe  to return her home, given the condition she cam ein. The patient has decided to go to SNF, CSW Mrs. Laural Benes is working her up for SNF with ideal choice [facility]. Per patient choice.  She will need her wound Vac and antibiotic therapy. Will continue to follow for any needs. Her son is aware of plans.    Expected Discharge Plan: Skilled Nursing Facility Barriers to Discharge: Continued Medical Work up  Expected Discharge Plan and Services Expected Discharge Plan: Skilled Nursing Facility In-house Referral: Clinical Social Work Discharge Planning Services: CM Consult   Living arrangements for the past 2 months: Single Family Home                                       Social Determinants of Health (SDOH) Interventions    Readmission Risk Interventions No flowsheet data found.

## 2020-03-03 NOTE — Progress Notes (Signed)
Patient ID: [name], female   DOB: 26-Jul-1961, 59 y.o.   MRN: [id number] BP (!) 157/76 (BP Location: Left Leg)   Pulse 81   Temp 99.5 F (37.5 C) (Oral)   Resp 17   LMP 02/18/2016   SpO2 100%  Alert and oriented x 4, speech is clear and fluent Moving lower extremities well Wound vac was leaking, changed by nursing and myself this afternoon. Wound looks very good, a healthy base present in both wounds. It takes two of the large sponges to pack both wounds and to bridge.  Possible discharge to snf this weekend.

## 2020-03-03 NOTE — Consult Note (Signed)
WOC Nurse wound follow up Wound type:lumbar surgical wound x 2  Bridge wounds together and apply NPWT (VAC) therapy.  Measurement:see notes from 03/01/20 Wound bed: Ruddy red,  Bleeds less today.   Drainage (amount, consistency, odor:  Canister is full and is changed.  Serosanguinous effluent present.  Periwound: intact Dressing procedure/placement/frequency: each wound requires 2 pieces black foam and a piece of foam to bridge are used.  Covered in drape and seal is immediately achieved.  Change Mon/Wed/Fri.  MD message via secure chat to be at bedside per request was not acknowledged.  Will not follow at this time.  Please re-consult if needed.  Maple Hudson MSN, RN, FNP-BC CWON Wound, Ostomy, Continence Nurse Pager [phone]

## 2020-03-04 LAB — GLUCOSE, CAPILLARY
Glucose-Capillary: 87 mg/dL (ref 70–99)
Glucose-Capillary: 156 mg/dL — ABNORMAL HIGH (ref 70–99)
Glucose-Capillary: 105 mg/dL — ABNORMAL HIGH (ref 70–99)
Glucose-Capillary: 104 mg/dL — ABNORMAL HIGH (ref 70–99)

## 2020-03-04 NOTE — Progress Notes (Signed)
Bag of Ancef was still hanging and did not get administered to patient on days.

## 2020-03-04 NOTE — Progress Notes (Signed)
Patient had a new PICC line placed, fluids were running but the cap was leaking and bed became soiled. IV team changed cap and flushed working great now.

## 2020-03-04 NOTE — Progress Notes (Signed)
  NEUROSURGERY PROGRESS NOTE   No issues overnight. No concerns this am  EXAM:  BP 105/64 (BP Location: Left Arm)   Pulse 88   Temp 99.2 F (37.3 C) (Oral)   Resp 19   LMP 02/18/2016   SpO2 100%   Awake, alert, oriented  Speech fluent, appropriate  CN grossly intact  MAEW Wounds look okay  PLAN Doing well dispo planning

## 2020-03-04 NOTE — TOC Progression Note (Signed)
Transition of Care [facility]) - Progression Note    Patient Details  Name: ROSARIO DUEY MRN: [id number] Date of Birth: 07/09/1961  [facility]) CM/SW Contact  Eduard Roux, [place] Phone Number: 03/04/2020, 10:32 AM  Clinical Narrative:     Patient's insurance still pending, reference # [id number]  Antony Blackbird, MSW, LCSWA Clinical Social Worker   Expected Discharge Plan: Skilled Nursing Facility Barriers to Discharge: Continued Medical Work up  Expected Discharge Plan and Services Expected Discharge Plan: Skilled Nursing Facility In-house Referral: Clinical Social Work Discharge Planning Services: CM Consult   Living arrangements for the past 2 months: Single Family Home                                       Social Determinants of Health (SDOH) Interventions    Readmission Risk Interventions No flowsheet data found.

## 2020-03-05 LAB — CULTURE, BLOOD (ROUTINE X 2)
Culture: NO GROWTH
Culture: NO GROWTH
Special Requests: ADEQUATE
Special Requests: ADEQUATE

## 2020-03-05 LAB — BASIC METABOLIC PANEL
Anion gap: 11 (ref 5–15)
BUN: 9 mg/dL (ref 6–20)
CO2: 26 mmol/L (ref 22–32)
Calcium: 8.3 mg/dL — ABNORMAL LOW (ref 8.9–10.3)
Chloride: 101 mmol/L (ref 98–111)
Creatinine, Ser: 0.56 mg/dL (ref 0.44–1.00)
GFR calc Af Amer: >60 mL/min (ref >60)
GFR calc non Af Amer: >60 mL/min (ref >60)
Glucose, Bld: 102 mg/dL — ABNORMAL HIGH (ref 70–99)
Potassium: 3.9 mmol/L (ref 3.5–5.1)
Sodium: 138 mmol/L (ref 135–145)

## 2020-03-05 LAB — GLUCOSE, CAPILLARY
Glucose-Capillary: 134 mg/dL — ABNORMAL HIGH (ref 70–99)
Glucose-Capillary: 98 mg/dL (ref 70–99)

## 2020-03-05 NOTE — Progress Notes (Signed)
Subjective: Patient reports having back pain  Objective: Vital signs in last 24 hours: Temp:  [98.3 F (36.8 C)-99.9 F (37.7 C)] 98.9 F (37.2 C) (05/30 0745) Pulse Rate:  [83-94] 83 (05/30 0745) Resp:  [18-22] 18 (05/30 0745) BP: (106-128)/(61-77) 124/68 (05/30 0745) SpO2:  [98 %-100 %] 100 % (05/30 0745)  Intake/Output from previous day: 05/29 0701 - 05/30 0700 In: 3710.9 [P.O.:720; I.V.:2790.9; IV Piggyback:200] Out: 6100 [Urine:5450; Drains:650] Intake/Output this shift: Total I/O In: 145.3 [I.V.:145.3] Out: -   Physical Exam: Wound vac in place  Lab Results: No results for input(s): WBC, HGB, HCT, PLT in the last 72 hours. BMET Recent Labs    03/05/20 0602  NA 138  K 3.9  CL 101  CO2 26  GLUCOSE 102*  BUN 9  CREATININE 0.56  CALCIUM 8.3*    Studies/Results: No results found.  Assessment/Plan: Continue wound vac.  Awaiting disposition.    LOS: 8 days    Dorian Heckle, MD 03/05/2020, 8:31 AM

## 2020-03-05 NOTE — TOC Progression Note (Signed)
Transition of Care [facility]) - Progression Note    Patient Details  Name: [name] MRN: [id number] Date of Birth: August 07, 1961  Transition of Care [facility]) CM/SW Contact  Eduard Roux, [place] Phone Number: 03/05/2020, 10:46 AM  Clinical Narrative:     Insurance authorization for SNF remains pending.  Antony Blackbird, MSW, LCSWA Clinical Social Worker   Expected Discharge Plan: Skilled Nursing Facility Barriers to Discharge: Continued Medical Work up  Expected Discharge Plan and Services Expected Discharge Plan: Skilled Nursing Facility In-house Referral: Clinical Social Work Discharge Planning Services: CM Consult   Living arrangements for the past 2 months: Single Family Home                                       Social Determinants of Health (SDOH) Interventions    Readmission Risk Interventions No flowsheet data found.

## 2020-03-06 LAB — SARS CORONAVIRUS 2 (TAT 6-24 HRS): SARS Coronavirus 2: NEGATIVE

## 2020-03-06 LAB — GLUCOSE, CAPILLARY
Glucose-Capillary: 80 mg/dL (ref 70–99)
Glucose-Capillary: 116 mg/dL — ABNORMAL HIGH (ref 70–99)
Glucose-Capillary: 96 mg/dL (ref 70–99)
Glucose-Capillary: 119 mg/dL — ABNORMAL HIGH (ref 70–99)
Glucose-Capillary: 99 mg/dL (ref 70–99)
Glucose-Capillary: 79 mg/dL (ref 70–99)

## 2020-03-06 MED ORDER — DIPHENHYDRAMINE HCL 25 MG PO TABS
50.0000 mg | ORAL_TABLET | Freq: Four times a day (QID) | ORAL | Status: DC | PRN
  Administered 2020-03-06 – 2020-03-07 (×3): 50 mg via ORAL
  Filled 2020-03-06 (×3): qty 2

## 2020-03-06 NOTE — Progress Notes (Signed)
OT Cancellation Note  Patient Details Name: [name] MRN: [id number] DOB: 06/20/1961   Cancelled Treatment:    Reason Eval/Treat Not Completed: Pain limiting ability to participate;Other (comment) Checked on pt @ 2 pm with pt reporting having just received pain meds needing ~ 1 hour for meds to kick in. Returned at 4:30 pm with pt having just finished with PT session. Will continue efforts for OT session as time allows.   Jill Amel., [name] [phone]   Angelina Pih 03/06/2020, 4:34 PM

## 2020-03-06 NOTE — TOC Progression Note (Addendum)
  Transition of Care [facility]) - Progression Note    Patient Details  Name: [name] MRN: [id number] Date of Birth: May 18, 1961  Transition of Care [facility]) CM/SW Contact  Eduard Roux, [place] Phone Number: 03/06/2020, 10:13 AM  Clinical Narrative:     Received insurance authorization # [id number] from 05/31-06/02.  CSW requested covid test  Per [facility] they are still waiting on wound vac- they inform CSW when they get it.  Antony Blackbird, MSW, LCSWA Clinical Social Worker   Expected Discharge Plan: Skilled Nursing Facility Barriers to Discharge: Continued Medical Work up  Expected Discharge Plan and Services Expected Discharge Plan: Skilled Nursing Facility In-house Referral: Clinical Social Work Discharge Planning Services: CM Consult   Living arrangements for the past 2 months: Single Family Home                                       Social Determinants of Health (SDOH) Interventions    Readmission Risk Interventions No flowsheet data found.

## 2020-03-06 NOTE — Consult Note (Signed)
WOC Nurse wound follow up Wound type:surgical 1. Distal 2. Proximal  Measurement: 1. Distal: 14cm x 6cm x 10cm  2. Proximal: 7cm x 3cm x 4cm  Wound bed: both clean; pink, moist, proximal seems to leak serous fluid in moderate amounts.  Drainage (amount, consistency, odor) serosanguinous; canister full  Periwound: maceration; black foam placed directly on patient's skin with last dressing change  Dressing procedure/placement/frequency: Removed old NPWT dressing Periwound skin protected with window framed with drape Skin protected between the two surgical wounds with VAC drape for foam bridge  Filled wound with distal wound with 2 pc  piece of black foam; filled proximal wound with 1pc of black foam.  Sealed NPWT dressing at HG Patient received IV pain medication per bedside nurse prior to dressing change Patient tolerated procedure well but was very uncomfortable with dressing change to deeper distal wound  WOC nurse will continue to provide NPWT dressing changed due to the complexity of the dressing change.    [name] [facility], CNS, [employer] [phone]

## 2020-03-06 NOTE — Progress Notes (Signed)
Subjective: Patient reports feeling better today  Objective: Vital signs in last 24 hours: Temp:  [98.4 F (36.9 C)-100.1 F (37.8 C)] 98.4 F (36.9 C) (05/31 1124) Pulse Rate:  [80-93] 80 (05/31 1124) Resp:  [13-24] 13 (05/31 1124) BP: (71-134)/(54-86) 113/68 (05/31 1124) SpO2:  [94 %-100 %] 98 % (05/31 1124)  Intake/Output from previous day: 05/30 0701 - 05/31 0700 In: 2358.5 [I.V.:2058.5; IV Piggyback:300] Out: 3410 [Urine:3050; Drains:360] Intake/Output this shift: Total I/O In: 1534.1 [P.O.:990; I.V.:544.1] Out: 3275 [Urine:3250; Drains:25]  Physical Exam: Wound vac in place, laying in bed.  Lab Results: No results for input(s): WBC, HGB, HCT, PLT in the last 72 hours. BMET Recent Labs    03/05/20 0602  NA 138  K 3.9  CL 101  CO2 26  GLUCOSE 102*  BUN 9  CREATININE 0.56  CALCIUM 8.3*    Studies/Results: No results found.  Assessment/Plan: Continue current management, awaiting placement.    LOS: 9 days    Dorian Heckle, MD 03/06/2020, 2:12 PM

## 2020-03-06 NOTE — TOC Progression Note (Signed)
Transition of Care [facility]) - Progression Note    Patient Details  Name: [name] MRN: [id number] Date of Birth: 1961/03/25  Transition of Care [facility]) CM/SW Contact  Eduard Roux, [place] Phone Number: 03/06/2020, 1:57 PM  Clinical Narrative:     Per [facility]- they still do not have wound vac - maybe will be delivered tomorrow.  Antony Blackbird, MSW, LCSWA Clinical Social Worker   Expected Discharge Plan: Skilled Nursing Facility Barriers to Discharge: Continued Medical Work up  Expected Discharge Plan and Services Expected Discharge Plan: Skilled Nursing Facility In-house Referral: Clinical Social Work Discharge Planning Services: CM Consult   Living arrangements for the past 2 months: Single Family Home                                       Social Determinants of Health (SDOH) Interventions    Readmission Risk Interventions No flowsheet data found.

## 2020-03-06 NOTE — Progress Notes (Signed)
Physical Therapy Treatment Patient Details Name: [name] MRN: [id number] DOB: 26-Feb-1961 Today's Date: 03/06/2020    History of Present Illness [name] is a 59 y.o. female who presents to the ED today via EMS due to post operative problem. Pt had posterior lumbar fusion L2-3 on 05/12 by Dr. Christella Noa. Discharged on 05/18. Pt under I&D of back incision with wound vac placement on 5/23. PSH: bilat TKA PMH: fibromyalgia, HTN, hyperthroidism, IBS, tyroid goiter.    PT Comments    Patient progressing slowly towards goals.  Needs increased time for all functional mobility and it seems to wear her out to move herself, but today able to sit up to EOB basically on her own, of course, utilizing HOB elevation to sit up, but with time able to get herself there.  Also able to practice stepping forward three trials today.  She is progressing well despite rough weekend per her report trying to get wound vac to work.  It did alarm once during session, but seemed to fix itself when she stood up.  PT to follow acutely.  Continue to recommend SNF level rehab at d/c.    Follow Up Recommendations  SNF;Supervision/Assistance - 24 hour     Equipment Recommendations  Wheelchair (measurements PT);Wheelchair cushion (measurements PT);Hospital bed    Recommendations for Other Services       Precautions / Restrictions Precautions Precautions: Back;Fall Precaution Comments: wound vac    Mobility  Bed Mobility Overal bed mobility: Needs Assistance Bed Mobility: Rolling;Sidelying to Sit;Sit to Sidelying Rolling: Supervision Sidelying to sit: Min assist     Sit to sidelying: Mod assist General bed mobility comments: used HOB to elevate trunk part way then used rail to come upright, assist for scooting hips partway; to side assist for legs into bed  Transfers Overall transfer level: Needs assistance Equipment used: Rolling walker (2 wheeled) Transfers: Sit to/from Stand Sit to Stand: Min  guard;From elevated surface         General transfer comment: elevated height of bed partway, not all the way, pt pulls up on RW to stand  Ambulation/Gait Ambulation/Gait assistance: Min assist Gait Distance (Feet): 3 Feet(x 3) Assistive device: Rolling walker (2 wheeled) Gait Pattern/deviations: Wide base of support;Decreased stride length;Step-to pattern     General Gait Details: stepping forward and back to bed to sit x 2, then side steps to [place]             Wheelchair Mobility    Modified Rankin (Stroke Patients Only)       Balance Overall balance assessment: Needs assistance Sitting-balance support: Feet supported Sitting balance-[name]: Fair     Standing balance support: Bilateral upper extremity supported Standing balance-[name]: Poor Standing balance comment: minA with BUE support of RW                            Cognition Arousal/Alertness: Awake/alert Behavior During Therapy: WFL for tasks assessed/performed Overall Cognitive Status: Within Functional Limits for tasks assessed                                        Exercises      General Comments General comments (skin integrity, edema, etc.): wound vac leak alarm sounded after pt sat up to EOB, adjusted tubing and when pt stood alarm stopped. repositioned tubing for  keeping free of kinks once back in bed, R sidelying      Pertinent Vitals/Pain Pain Assessment: Faces Faces Pain Scale: Hurts worst Pain Location: back Pain Descriptors / Indicators: Burning;Grimacing;Moaning Pain Intervention(s): Monitored during session;Repositioned;Patient requesting pain meds-RN notified    Home Living                      Prior Function            PT Goals (current goals can now be found in the care plan section) Progress towards PT goals: Progressing toward goals    Frequency    Min 3X/week      PT Plan Frequency needs to be updated     Co-evaluation              AM-PAC PT "6 Clicks" Mobility   Outcome Measure  Help needed turning from your back to your side while in a flat bed without using bedrails?: A Little Help needed moving from lying on your back to sitting on the side of a flat bed without using bedrails?: A Little Help needed moving to and from a bed to a chair (including a wheelchair)?: A Lot Help needed standing up from a chair using your arms (e.g., wheelchair or bedside chair)?: A Lot Help needed to walk in hospital room?: A Little Help needed climbing 3-5 steps with a railing? : Total 6 Click Score: 14    End of Session   Activity Tolerance: Patient limited by pain Patient left: with call bell/phone within reach;in bed;with family/visitor present Nurse Communication: Patient requests pain meds PT Visit Diagnosis: Unsteadiness on feet (R26.81);History of falling (Z91.81);Pain Pain - part of body: (back)     Time: 0626-9485 PT Time Calculation (min) (ACUTE ONLY): 42 min  Charges:  $Gait Training: 8-22 mins $Therapeutic Activity: 23-37 mins                     [name], [place] [facility] [phone] 03/06/2020    [name] 03/06/2020, 6:17 PM

## 2020-03-07 LAB — GLUCOSE, CAPILLARY
Glucose-Capillary: 106 mg/dL — ABNORMAL HIGH (ref 70–99)
Glucose-Capillary: 91 mg/dL (ref 70–99)
Glucose-Capillary: 93 mg/dL (ref 70–99)

## 2020-03-07 NOTE — Progress Notes (Signed)
Patients belongings and wound vac have been packed up and ready for discharge. Report was called to [place] at [facility] nursing facility. All question answered.

## 2020-03-07 NOTE — Social Work (Signed)
Clinical Social Worker facilitated patient discharge including contacting patient family and facility to confirm patient discharge plans.  Clinical information faxed to facility and family agreeable with plan.  CSW arranged ambulance transport via [facility] to [facility]. RN to call [phone]  with report prior to discharge.  Clinical Social Worker will sign off for now as social work intervention is no longer needed. Please consult us again if new need arises.  Lagretta Loseke, MSW, LCSW Clinical Social Worker   

## 2020-03-07 NOTE — TOC Transition Note (Signed)
Transition of Care [facility]) - CM/SW Discharge Note   Patient Details  Name: MIONNA ADVINCULA MRN: [id number] Date of Birth: 1961-03-06  Transition of Care [facility]) CM/SW Contact:  Doy Hutching, LCSW Phone Number: 03/07/2020, 3:43 PM   Clinical Narrative:    Pt stable for dc per MD. Discharge summary sent through hub, RN aware to change vac and that we need signed scripts for controlled medications.   [facility] called for 4:30. Pt aware, she states she will call her family to come get her items and will alert her son about d/c today. Report number in progress notes, [facility] papers sent to floor. RN Kimberley aware.    Final next level of care: Skilled Nursing Facility Barriers to Discharge: Barriers Resolved   Patient Goals and CMS Choice Patient states their goals for this hospitalization and ongoing recovery are:: Get stronger recovery at SNF      Discharge Placement PASRR number recieved: 03/01/20            Patient chooses bed at: [facility] Patient to be transferred to facility by: [facility] Name of family member notified: pt will notify her sister and son Patient and family notified of of transfer: 03/07/20  Discharge Plan and Services In-house Referral: Clinical Social Work Discharge Planning Services: CM Consult  Readmission Risk Interventions No flowsheet data found.

## 2020-03-07 NOTE — TOC Progression Note (Addendum)
Transition of Care [facility]) - Progression Note    Patient Details  Name: [name] MRN: [id number] Date of Birth: [birth year]  Transition of Care [facility]) CM/SW Contact  Doy Hutching, [name] Phone Number: 03/07/2020, 10:08 AM  Clinical Narrative:    1:07pm- CSW appreciative of assistance from [facility], we have heard from [facility] rep [place] who requests pt d/c to SNF with home vac and then will have other vac delivered to SNF. [facility] is okay with this plan as well per liaison Tonya. Beside RN aware and we await d/c summary and orders from attending.   10:08am- CSW has reached out to [facility] regarding wound vac delivery. Await response. Auth valid through tomorrow, 6/2. COVID test negative.    Expected Discharge Plan: Skilled Nursing Facility Barriers to Discharge: Continued Medical Work up  Expected Discharge Plan and Services Expected Discharge Plan: Skilled Nursing Facility In-house Referral: Clinical Social Work Discharge Planning Services: CM Consult Living arrangements for the past 2 months: Single Family Home     Readmission Risk Interventions No flowsheet data found.

## 2020-03-08 ENCOUNTER — Non-Acute Institutional Stay (SKILLED_NURSING_FACILITY): Payer: Medicare Other | Admitting: Adult Health

## 2020-03-08 ENCOUNTER — Encounter: Payer: Self-pay | Admitting: Adult Health

## 2020-03-08 DIAGNOSIS — G629 Polyneuropathy, unspecified: Secondary | ICD-10-CM

## 2020-03-08 DIAGNOSIS — R7881 Bacteremia: Secondary | ICD-10-CM | POA: Diagnosis not present

## 2020-03-08 DIAGNOSIS — E876 Hypokalemia: Secondary | ICD-10-CM | POA: Diagnosis not present

## 2020-03-08 DIAGNOSIS — M5136 Other intervertebral disc degeneration, lumbar region: Secondary | ICD-10-CM | POA: Diagnosis not present

## 2020-03-08 DIAGNOSIS — M7989 Other specified soft tissue disorders: Secondary | ICD-10-CM

## 2020-03-08 DIAGNOSIS — F419 Anxiety disorder, unspecified: Secondary | ICD-10-CM

## 2020-03-08 DIAGNOSIS — I10 Essential (primary) hypertension: Secondary | ICD-10-CM

## 2020-03-08 DIAGNOSIS — B9561 Methicillin susceptible Staphylococcus aureus infection as the cause of diseases classified elsewhere: Secondary | ICD-10-CM

## 2020-03-08 DIAGNOSIS — K5909 Other constipation: Secondary | ICD-10-CM

## 2020-03-08 NOTE — Progress Notes (Signed)
Location:  [facility] Room Number: 403-K Place of Service:  SNF (31) Provider:  Durenda Age, DNP, FNP-BC  Patient Care Team: Alroy Dust, L.Marlou Sa, MD as PCP - General (Family Medicine) Ashok Pall, MD (Neurosurgery)  Extended Emergency Contact Information Primary Emergency Contact: Cooper,Renee Address: [address]          [place],  [address] [place] of [place] Phone: [phone] Mobile Phone: [phone] Relation: Sister Secondary Emergency Contact: Rayna Sexton Address: [address], [place] Phone: [phone] Mobile Phone: [phone] Relation: Sister  Code Status:  FULL CODE  Goals of care: Advanced Directive information Advanced Directives 02/20/2020  Does Patient Have a Medical Advance Directive? No  Would patient like information on creating a medical advance directive? No - Patient declined  Pre-existing out of facility DNR order (yellow form or pink MOST form) -     Chief Complaint  Patient presents with  . Acute Visit    Patient is seen for hospital followup, status post admission at [facility] 5/22-03/07/20 for MSSA bacteremia    HPI:  Pt is a 59 y.o. female who was admitted to [facility] on 03/07/20 post [facility] hospitalization 02/26/20 to 03/07/20 for MSSA bacteremia. She has PMH of chronic back pain, GERD, hypertension, hyperthyroidism, depression/anxiety and impaired memory. On 02/16/20, she had a posterior lumbar fusion L2-3 on 5/12 by Dr. Christella Noa. She was discharged from the hospital on 5/18. On day of discharge, patient reported noticing drainage at her incision. Dr. Christella Noa resutured her and discharged her on the same day. Patient reported that her incision site continued to drain while at home. Family member noticed excessive drainage and patient experienced a few minutes of numbness in BUE and BLE extremities which disappeared by applying  Voltaren gel and called Dr. Christella Noa who advised to go to ED. On 02/27/20, she had wound exploration/wound debridement. Both thoracic incision for a disc herniation and lumbar incision for a lumbar discectomy and arthrodesis were opened. Both had purulent drainage. Wound vac was placed. PICC line was placed after blood cultures were negative after starting Nafcillin and Rifampin.  She was seen in her room today. She reported that she was able to walk to the bathroom with assistance yesterday. She reported having constant pain due to fibromyalgia..    Past Medical History:  Diagnosis Date  . Anemia   . Anemia   . Ankle syndesmosis disruption   . Anxiety    takes Ativan and Valium, after mother passed  . Arthritis    bilateral knees s/p knee replacement bilaterally  . Asthma    but pt states not severe enough to even have an inhaler  . Bronchitis   . Bruising    pt states unexplained d/t fibromyalgia  . Chronic back pain    2012 tailbone surgery and 3 lower discs.   . Closed fracture of distal end of right fibula and tibia   . Depression    from Fibromyalgia diagnosis; not taking medicine. since 2001  . Dizziness    rarely  . Fibromyalgia    diagnosed 2001  . GERD (gastroesophageal reflux disease)    Prilosec occasionally  . Headache(784.0)    "sinus headaches"  . History of hiatal hernia   . Hypertension    since 2013  . Hyperthyroidism    subclinical, no treatment; thyroid nodules  . IBS (irritable bowel syndrome)   . Impaired memory  states from fibromyalgia  . Insomnia    takes Ambien  . Jones fracture    left foot fifth metatarsal  . Multiple allergies    including latex, pet dander, shellfish, pet dander  . Painful orthopaedic hardware right ankle 05/27/2016  . Seasonal allergies   . Shortness of breath    with exertion;pt states that its related to her weight  . Sore gums    this is why pt is on Amoxil-only takes for dental work  . Tachycardia   . Thyroid  goiter   . Varicose vein    protrudes above skin-per pt;vein popped and bruised;ultrasound done to make sure that there were no clots;noclots were found   Past Surgical History:  Procedure Laterality Date  . ANTERIOR LUMBAR FUSION  09/20/2011   Procedure: ANTERIOR LUMBAR FUSION 1 LEVEL;  Surgeon: Winfield Cunas;  Location: [facility];  Service: Neurosurgery;  Laterality: N/A;  Lumbar five-Sacral One Anterior Lumbar Interbody Fusion /Dr. Early to Approach   . CERVICAL DISC SURGERY     WITH TITANIUM PLATE IN NECK---LEFT SIDE  . CERVICAL SPINE SURGERY  03/24/2019  . CESAREAN SECTION    . fibroidectomy    . FRACTURE SURGERY  05/08/2010   Jones fracture left foot fifth metatarsal  . HARDWARE REMOVAL Right 05/28/2016   Procedure: RIGHT ANKLE HARDWARE REMOVAL;  Surgeon: Elsie Saas, MD;  Location: [facility];  Service: Orthopedics;  Laterality: Right;  . IRRIGATION AND DEBRIDEMENT KNEE  04/09/2012   Procedure: IRRIGATION AND DEBRIDEMENT KNEE;  Surgeon: Yvette Rack., MD;  Location: [facility];  Service: Orthopedics;  Laterality: Right;  . JOINT REPLACEMENT  01-27-2012   left total knee and Right total knee  . KNEE SURGERY  2005   Left knee arthroscopy  . NASAL SEPTOPLASTY W/ TURBINOPLASTY  2007   due to recurrent sinusitis  . ORIF ANKLE FRACTURE Right 06/21/2015   Procedure: OPEN REDUCTION INTERNAL FIXATION RIGHT DISTAL FIBULA  FRACTURE AND OPEN REDUCTION INTERNAL FIXATION SYNDESMOSIS ;  Surgeon: Elsie Saas, MD;  Location: [facility];  Service: Orthopedics;  Laterality: Right;  . SHOULDER ARTHROSCOPY W/ ROTATOR CUFF REPAIR Right   . SHOULDER SURGERY Left   . SPINE SURGERY  2004   Cervical plate, ACDF  . STERIOD INJECTION  01/27/2012   Procedure: STEROID INJECTION;  Surgeon: Lorn Junes, MD;  Location: [facility] [place];  Service: Orthopedics;  Laterality: Right;  . TEE WITHOUT CARDIOVERSION N/A 03/01/2020   Procedure: TRANSESOPHAGEAL ECHOCARDIOGRAM (TEE);  Surgeon: Jerline Pain, MD;  Location: [facility] ENDOSCOPY;  Service: Cardiovascular;  Laterality: N/A;  . THORACIC DISCECTOMY  02/16/2020   Procedure: Thoracic ten-eleven Discectomy;  Surgeon: Ashok Pall, MD;  Location: [facility];  Service: Neurosurgery;;  . TONSILLECTOMY  2007  . TOTAL KNEE ARTHROPLASTY  01/27/2012   Procedure: TOTAL KNEE ARTHROPLASTY;  Surgeon: Lorn Junes, MD;  Bilateral  . TOTAL KNEE ARTHROPLASTY  04/06/2012   Procedure: TOTAL KNEE ARTHROPLASTY;  Surgeon: Lorn Junes, MD;  Location: [facility];  Service: Orthopedics;  Laterality: Right;  . TUBAL LIGATION    . UTERINE FIBROID SURGERY     mid 200s  . WOUND EXPLORATION N/A 02/27/2020   Procedure: WOUND EXPLORATION;  Surgeon: Ashok Pall, MD;  Location: [place];  Service: Neurosurgery;  Laterality: N/A;    Allergies  Allergen Reactions  . Monosodium Glutamate Anaphylaxis and Swelling    Eyes swollen shut, facial swelling, tongue swelling.  . Shellfish Allergy Anaphylaxis  .  Celebrex [Celecoxib] Itching    Only allergic to generic brand  . Contrast Media [Iodinated Diagnostic Agents] Itching and Nausea Only    "could not walk"  . Diclofenac Itching    Can take the name brand Voltaren  . Other Other (See Comments)    Pet dander, Mildew, Mold  Cannot tolerate generic DICLOFENAC GEL--MUST USE BRAND NAME ([place])   . Latex Itching and Rash    Latex glove with powder    Outpatient Encounter Medications as of 03/08/2020  Medication Sig  . albuterol (PROVENTIL HFA;VENTOLIN HFA) 108 (90 BASE) MCG/ACT inhaler Inhale 2 puffs into the lungs every 6 (six) hours as needed for wheezing or shortness of breath.  . AMITIZA 24 MCG capsule Take 24 mcg by mouth 2 (two) times daily with a meal.   . aspirin EC 81 MG tablet Take 81 mg by mouth daily.  Marland Kitchen atenolol (TENORMIN) 50 MG tablet Take 50 mg by mouth daily.  Marland Kitchen CALCIUM-VITAMIN D PO Take 1 tablet by mouth 2 (two) times daily.  Marland Kitchen ceFAZolin (ANCEF) IVPB Inject 2 g into the vein every 8  (eight) hours. Indication:  MSSA bacteremia/ lumbar infection First Dose: Yes Last Day of Therapy:  04/11/2020 Labs - Once weekly:  CBC/D and CMP, Labs - Every other week:  ESR and CRP Method of administration: IV Push Method of administration may be changed at the discretion of home infusion pharmacist based upon assessment of the patient and/or caregiver's ability to self-administer the medication ordered.  . CELEBREX 200 MG capsule Take 200 mg by mouth 2 (two) times daily.  . chlorzoxazone (PARAFON) 500 MG tablet Take 500 mg by mouth 3 (three) times daily.  . cyclobenzaprine (FLEXERIL) 10 MG tablet Take 10 mg by mouth 3 (three) times daily.  . diazepam (VALIUM) 5 MG tablet Take 5 mg by mouth 2 (two) times daily.   . diclofenac sodium (VOLTAREN) 1 % GEL Apply 1 application topically 2 (two) times daily as needed (shoulder, arm and leg pain). For right shoulder and arm and legs  . furosemide (LASIX) 20 MG tablet Take 40 mg by mouth daily.   Marland Kitchen gabapentin (NEURONTIN) 600 MG tablet Take 1,200 mg by mouth 3 (three) times daily.   . hydrochlorothiazide (HYDRODIURIL) 25 MG tablet Take 1 tablet (25 mg total) by mouth daily. One tablet by mouth in the morning.  . hydrOXYzine (ATARAX/VISTARIL) 50 MG tablet Take 50 mg by mouth 2 (two) times daily.  . methocarbamol (ROBAXIN) 500 MG tablet Take 500 mg by mouth 4 (four) times daily.   . Multiple Vitamin (MULTIVITAMIN WITH MINERALS) TABS tablet Take 1 tablet by mouth daily.  Marland Kitchen omeprazole (PRILOSEC) 20 MG capsule Take 20 mg by mouth daily.   Marland Kitchen OVER THE COUNTER MEDICATION Take 1 tablet by mouth daily. Neuriva Brain Supplement  . oxyCODONE-acetaminophen (PERCOCET) 10-325 MG tablet Take 1 tablet by mouth every 6 (six) hours as needed for pain.  . potassium chloride (KLOR-CON) 10 MEQ tablet Take 10 mEq by mouth every other day.   . rifampin (RIFADIN) 300 MG capsule Take 1 capsule (300 mg total) by mouth 2 (two) times daily.  Marland Kitchen senna (SENOKOT) 8.6 MG TABS tablet  Take 1 tablet by mouth 2 (two) times daily.  Marland Kitchen tiZANidine (ZANAFLEX) 4 MG tablet Take 1 tablet (4 mg total) by mouth every 6 (six) hours as needed for muscle spasms.  . [DISCONTINUED] diphenhydrAMINE (BENADRYL) 25 MG tablet Take 50 mg by mouth every 6 (six) hours  as needed for allergies.   . [DISCONTINUED] diphenhydramine-acetaminophen (TYLENOL PM) 25-500 MG TABS tablet Take 2 tablets by mouth at bedtime as needed (sleep/pain).  . [DISCONTINUED] oxyCODONE-acetaminophen (PERCOCET) 10-325 MG tablet 1 tablet if needed for pain.   No facility-administered encounter medications on file as of 03/08/2020.    Review of Systems  GENERAL: No change in appetite, no fatigue, no weight changes, no fever, chills or weakness MOUTH and THROAT: Denies oral discomfort, gingival pain or bleeding RESPIRATORY: no cough, SOB, DOE, wheezing, hemoptysis CARDIAC: No chest pain, edema or palpitations GI: No abdominal pain, diarrhea, constipation, heart burn, nausea or vomiting GU: Denies dysuria, frequency, hematuria, incontinence, or discharge NEUROLOGICAL: Denies dizziness, syncope, numbness, or headache PSYCHIATRIC: Denies feelings of depression or anxiety. No report of hallucinations, insomnia, paranoia, or agitation   Immunization History  Administered Date(s) Administered  . Td 05/08/2010   Pertinent  Health Maintenance Due  Topic Date Due  . COLONOSCOPY  Never done  . PAP SMEAR-Modifier  12/12/2018  . INFLUENZA VACCINE  05/07/2020  . MAMMOGRAM  10/10/2021    Vitals:   03/08/20 1011  BP: 110/71  Pulse: 84  Resp: 16  Temp: (!) 97.2 F (36.2 C)  TempSrc: Oral  SpO2: 99%  Weight: 191 lb 12.8 oz (87 kg)  Height: 5' 4"  (1.626 m)   Body mass index is 32.92 kg/m.  Physical Exam   GENERAL APPEARANCE: Well nourished. In no acute distress. Obese SKIN:  Has surgical wound on back  MOUTH and THROAT: Lips are without lesions. Oral mucosa is moist and without lesions. Tongue is normal in shape, size,  and color and without lesions RESPIRATORY: Breathing is even & unlabored, BS CTAB CARDIAC: RRR, no murmur,no extra heart sounds, no edema. Right upper arm SL PICC line GI: Abdomen soft, normal BS, no masses, no tenderness EXTREMITIES:  Able to move X 4 extremities NEUROLOGICAL: There is no tremor. Speech is clear. Alert and oriented X 3 PSYCHIATRIC: . Affect and behavior are appropriate  Labs reviewed: Recent Labs    02/14/20 1612 02/16/20 1125 02/28/20 0139 03/02/20 0516 03/05/20 0602  NA 139   < > 140 138 138  K 2.4*   < > 4.5 3.2* 3.9  CL 100   < > 106 97* 101  CO2 22   < > 23 26 26   GLUCOSE 83   < > 166* 88 102*  BUN 13   < > 12 10 9   CREATININE 0.93   < > 0.69 0.62 0.56  CALCIUM 9.8   < > 8.3* 8.0* 8.3*  MG 2.3  --   --   --   --    < > = values in this interval not displayed.   Recent Labs    02/26/20 2122 03/02/20 0516  AST 41 9*  ALT 30 16  ALKPHOS 106 89  BILITOT 0.7 1.0  PROT 5.1* 4.8*  ALBUMIN 2.1* 1.6*   Recent Labs    02/14/20 1612 02/14/20 1612 02/26/20 2122 02/28/20 0139 03/02/20 0516  WBC 11.4*   < > 24.8* 19.1* 15.6*  NEUTROABS 5.8  --  22.0*  --  9.5*  HGB 12.4   < > 8.5* 8.1* 7.4*  HCT 41.7   < > 28.3* 26.4* 24.4*  MCV 77.1*   < > 77.3* 75.4* 76.3*  PLT 323   < > 341 321 378   < > = values in this interval not displayed.    Lab Results  Component Value Date  HGBA1C 6.0 (H) 02/29/2020   Lab Results  Component Value Date   CHOL 196 12/30/2013   HDL 70 12/30/2013   LDLCALC 106 (H) 12/30/2013   TRIG 102 12/30/2013   CHOLHDL 2.8 12/30/2013    Significant Diagnostic Results in last 30 days:  DG THORACOLUMABAR SPINE  Result Date: 02/16/2020 CLINICAL DATA:  L2-3 fusion with T10-11 discectomy EXAM: DG C-ARM 1-60 MIN; THORACOLUMBAR SPINE - 2 VIEW COMPARISON:  None. FLUOROSCOPY TIME:  Fluoroscopy Time:  1 minutes 44 seconds Radiation Exposure Index (if provided by the fluoroscopic device): Not available Number of Acquired Spot Images: 8  FINDINGS: Initial images demonstrate the pedicle screw placement at L2 with subsequent interbody fusion and connection to previously placed fusion hardware from L5. Subsequent localization of T10 was performed for discectomy at T10-T11. IMPRESSION: Lumbar fusion at L2-3 with subsequent thoracic discectomy at T10-T11. Electronically Signed   By: Inez Catalina M.D.   On: 02/16/2020 22:39   MR THORACIC SPINE WO CONTRAST  Result Date: 02/14/2020 CLINICAL DATA:  Back pain with progressive neurological deficit. EXAM: MRI THORACIC SPINE WITHOUT CONTRAST TECHNIQUE: Multiplanar, multisequence MR imaging of the thoracic spine was performed. No intravenous contrast was administered. COMPARISON:  None. FINDINGS: Alignment:  Thoracic alignment within normal limits. Vertebrae: No fracture or primary bone lesion. Cord:  No primary cord lesion.  See below regarding stenosis. Paraspinal and other soft tissues: Negative Disc levels: No abnormality from T1-2 through T4-5. From T5-6 through T9-10, there is disc degeneration with bulging of the disc but no compressive stenosis of the canal or foramina. At T10-11, there is a central disc herniation that effaces the ventral subarachnoid space and indents the ventral cord. Question early T2 signal in the cord at this level. This could be significant. There is foraminal narrowing left more than right. T11-12 and T12-L1 show mild disc bulges and ordinary facet hypertrophy but no compressive stenosis. IMPRESSION: At T10-11, there is a central disc herniation that effaces the ventral subarachnoid space and indents the ventral aspect of the cord. Question early T2 signal in the cord at this level. This could be a significant lesion. Lesser, grossly non-compressive degenerative changes at the other levels as outlined above. Electronically Signed   By: Nelson Chimes M.D.   On: 02/14/2020 18:42   MR LUMBAR SPINE WO CONTRAST  Result Date: 02/14/2020 CLINICAL DATA:  Low back pain.  Grafts  neurological deficit. EXAM: MRI LUMBAR SPINE WITHOUT CONTRAST TECHNIQUE: Multiplanar, multisequence MR imaging of the lumbar spine was performed. No intravenous contrast was administered. COMPARISON:  CT 06/11/2019.  MRI 03/10/2019. FINDINGS: Segmentation: 5 lumbar type vertebral bodies as numbered previously. Alignment:  No malalignment. Vertebrae: Previous fusion procedures from L3 to the sacrum. No fracture Conus medullaris and cauda equina: Conus extends to the L2 level. Conus and cauda equina appear normal. Paraspinal and other soft tissues: Negative Disc levels: L1-2: Mild disc bulge. Mild facet hypertrophy. No compressive stenosis. L2-3: Left posterolateral disc herniation with upward migration of a large fragment behind L2 to the left of midline. This compresses thecal sac and would likely cause left-sided symptoms affecting at least the left L2 nerve and possibly other left-sided nerves. There is facet arthropathy at this level and there is likely to be abnormal motion. The central canal is stenotic and could result in other nerve compression. L3-4: Previous discectomy and fusion procedure. Wide patency of the canal and foramina. L4-5: Previous discectomy and fusion procedure. Wide patency of the canal and foramina. L5-S1: Previous discectomy and  fusion procedure. Wide patency of the canal and foramina. IMPRESSION: Previous discectomy and fusion from L3 to the sacrum. Good appearance in that region. Adjacent segment pathology at L2-3. Disc degeneration with circumferential protrusion. Left posterolateral herniation with upward migration of a large fragment in the left lateral recess behind L2. Bilateral facet arthropathy. Spinal stenosis at the disc level. Left-sided neural compression quite likely because of the upper migrated disc fragment. Because of the facet arthropathy, abnormal motion probably occurs at this level with flexion and extension. Electronically Signed   By: Nelson Chimes M.D.   On:  02/14/2020 18:47   DG C-Arm 1-60 Min  Result Date: 02/16/2020 CLINICAL DATA:  L2-3 fusion with T10-11 discectomy EXAM: DG C-ARM 1-60 MIN; THORACOLUMBAR SPINE - 2 VIEW COMPARISON:  None. FLUOROSCOPY TIME:  Fluoroscopy Time:  1 minutes 44 seconds Radiation Exposure Index (if provided by the fluoroscopic device): Not available Number of Acquired Spot Images: 8 FINDINGS: Initial images demonstrate the pedicle screw placement at L2 with subsequent interbody fusion and connection to previously placed fusion hardware from L5. Subsequent localization of T10 was performed for discectomy at T10-T11. IMPRESSION: Lumbar fusion at L2-3 with subsequent thoracic discectomy at T10-T11. Electronically Signed   By: Inez Catalina M.D.   On: 02/16/2020 22:39   ECHOCARDIOGRAM COMPLETE  Result Date: 02/29/2020    ECHOCARDIOGRAM REPORT   Patient Name:   LARRAINE ARGO Date of Exam: 02/29/2020 Medical Rec #:  [id number]       Height:       64.0 in Accession #:    [id number]      Weight:       191.8 lb Date of Birth:  02/27/1961      BSA:          1.922 m Patient Age:    8 years        BP:           125/71 mmHg Patient Gender: F               HR:           89 bpm. Exam Location:  Inpatient Procedure: 2D Echo, Cardiac Doppler and Color Doppler Indications:    Bacteremia  History:        Patient has no prior history of Echocardiogram examinations.                 Risk Factors:Hypertension and Former Smoker. GERD.  Sonographer:    Clayton Lefort RDCS (AE) Referring Phys: [place]  1. Left ventricular ejection fraction, by estimation, is 55 to 60%. The left ventricle has normal function. The left ventricle has no regional wall motion abnormalities. There is mild left ventricular hypertrophy. Left ventricular diastolic parameters are consistent with Grade I diastolic dysfunction (impaired relaxation).  2. Right ventricular systolic function is normal. The right ventricular size is normal. Tricuspid regurgitation  signal is inadequate for assessing PA pressure.  3. Left atrial size was mildly dilated.  4. The mitral valve is normal in structure. No evidence of mitral valve regurgitation. No evidence of mitral stenosis.  5. The aortic valve is tricuspid. Aortic valve regurgitation is not visualized. Mild aortic valve sclerosis is present, with no evidence of aortic valve stenosis.  6. The inferior vena cava is normal in size with greater than 50% respiratory variability, suggesting right atrial pressure of 3 mmHg. FINDINGS  Left Ventricle: Left ventricular ejection fraction, by estimation, is 55 to 60%. The left ventricle  has normal function. The left ventricle has no regional wall motion abnormalities. The left ventricular internal cavity size was normal in size. There is  mild left ventricular hypertrophy. Left ventricular diastolic parameters are consistent with Grade I diastolic dysfunction (impaired relaxation). Right Ventricle: The right ventricular size is normal. No increase in right ventricular wall thickness. Right ventricular systolic function is normal. Tricuspid regurgitation signal is inadequate for assessing PA pressure. Left Atrium: Left atrial size was mildly dilated. Right Atrium: Right atrial size was normal in size. Pericardium: There is no evidence of pericardial effusion. Mitral Valve: The mitral valve is normal in structure. There is mild calcification of the mitral valve leaflet(s). Mild mitral annular calcification. No evidence of mitral valve regurgitation. No evidence of mitral valve stenosis. MV peak gradient, 7.4 mmHg. The mean mitral valve gradient is 4.0 mmHg. Tricuspid Valve: The tricuspid valve is normal in structure. Tricuspid valve regurgitation is trivial. Aortic Valve: The aortic valve is tricuspid. Aortic valve regurgitation is not visualized. Mild aortic valve sclerosis is present, with no evidence of aortic valve stenosis. Aortic valve mean gradient measures 6.0 mmHg. Aortic valve peak  gradient measures 13.2 mmHg. Aortic valve area, by VTI measures 1.64 cm. Pulmonic Valve: The pulmonic valve was normal in structure. Pulmonic valve regurgitation is trivial. Aorta: The aortic root is normal in size and structure. Venous: The inferior vena cava is normal in size with greater than 50% respiratory variability, suggesting right atrial pressure of 3 mmHg. IAS/Shunts: No atrial level shunt detected by color flow Doppler.  LEFT VENTRICLE PLAX 2D LVIDd:         4.59 cm  Diastology LVIDs:         2.89 cm  LV e' lateral:   9.57 cm/s LV PW:         1.27 cm  LV E/e' lateral: 10.8 LV IVS:        1.30 cm  LV e' medial:    8.59 cm/s LVOT diam:     1.70 cm  LV E/e' medial:  12.0 LV SV:         50 LV SV Index:   26 LVOT Area:     2.27 cm  RIGHT VENTRICLE             IVC RV Basal diam:  2.59 cm     IVC diam: 1.61 cm RV S prime:     10.30 cm/s TAPSE (M-mode): 2.4 cm LEFT ATRIUM             Index       RIGHT ATRIUM           Index LA diam:        3.20 cm 1.67 cm/m  RA Area:     13.60 cm LA Vol (A2C):   29.6 ml 15.40 ml/m RA Volume:   36.00 ml  18.73 ml/m LA Vol (A4C):   60.4 ml 31.43 ml/m LA Biplane Vol: 42.9 ml 22.32 ml/m  AORTIC VALVE AV Area (Vmax):    1.67 cm AV Area (Vmean):   1.58 cm AV Area (VTI):     1.64 cm AV Vmax:           182.00 cm/s AV Vmean:          116.000 cm/s AV VTI:            0.304 m AV Peak Grad:      13.2 mmHg AV Mean Grad:      6.0 mmHg LVOT Vmax:  134.00 cm/s LVOT Vmean:        80.700 cm/s LVOT VTI:          0.219 m LVOT/AV VTI ratio: 0.72  AORTA Ao Root diam: 2.70 cm Ao Asc diam:  2.60 cm MITRAL VALVE MV Area (PHT): 4.17 cm     SHUNTS MV Peak grad:  7.4 mmHg     Systemic VTI:  0.22 m MV Mean grad:  4.0 mmHg     Systemic Diam: 1.70 cm MV Vmax:       1.36 m/s MV Vmean:      86.8 cm/s MV Decel Time: 182 msec MV E velocity: 103.00 cm/s MV A velocity: 102.00 cm/s MV E/A ratio:  1.01 Loralie Champagne MD Electronically signed by Loralie Champagne MD Signature Date/Time: 02/29/2020/2:56:17  PM    Final    ECHO TEE  Result Date: 03/01/2020    TRANSESOPHOGEAL ECHO REPORT   Patient Name:   SHELLSEA BORUNDA Date of Exam: 03/01/2020 Medical Rec #:  [id number]       Height:       64.0 in Accession #:    [id number]      Weight:       191.8 lb Date of Birth:  1961/02/12      BSA:          1.922 m Patient Age:    52 years        BP:           110/52 mmHg Patient Gender: F               HR:           59 bpm. Exam Location:  Inpatient Procedure: 2D Echo Indications:     Bacteremia 790.7 / R78.81  History:         Patient has prior history of Echocardiogram examinations, most                  recent 02/29/2020. Risk Factors:Hypertension. MSSA bacteremia.  Sonographer:     Jaquita Folds Referring Phys:  [phone] JILL D MCDANIEL Diagnosing Phys: Candee Furbish MD PROCEDURE: The transesophogeal probe was passed without difficulty through the esophogus of the patient. Sedation performed by different physician. The patient was monitored while under deep sedation. Anesthestetic sedation was provided intravenously by Anesthesiology: 362m of Propofol. The patient's vital signs; including heart rate, blood pressure, and oxygen saturation; remained stable throughout the procedure. The patient developed no complications during the procedure. IMPRESSIONS  1. Left ventricular ejection fraction, by estimation, is 60 to 65%. The left ventricle has normal function. The left ventricle has no regional wall motion abnormalities.  2. Right ventricular systolic function is normal. The right ventricular size is normal.  3. No left atrial/left atrial appendage thrombus was detected.  4. The mitral valve is normal in structure. Mild mitral valve regurgitation. No evidence of mitral stenosis.  5. The aortic valve is normal in structure. Aortic valve regurgitation is not visualized. No aortic stenosis is present.  6. The inferior vena cava is normal in size with greater than 50% respiratory variability, suggesting right atrial pressure of  3 mmHg. Conclusion(s)/Recommendation(s): No evidence of vegetation/infective endocarditis on this transesophageal echocardiogram. FINDINGS  Left Ventricle: Left ventricular ejection fraction, by estimation, is 60 to 65%. The left ventricle has normal function. The left ventricle has no regional wall motion abnormalities. The left ventricular internal cavity size was normal in size. There is  no left ventricular hypertrophy. Right Ventricle: The  right ventricular size is normal. No increase in right ventricular wall thickness. Right ventricular systolic function is normal. Left Atrium: Left atrial size was normal in size. No left atrial/left atrial appendage thrombus was detected. Right Atrium: Right atrial size was normal in size. Prominent Chiari network. Pericardium: There is no evidence of pericardial effusion. Mitral Valve: The mitral valve is normal in structure. Normal mobility of the mitral valve leaflets. Mild mitral valve regurgitation. No evidence of mitral valve stenosis. Tricuspid Valve: The tricuspid valve is normal in structure. Tricuspid valve regurgitation is trivial. No evidence of tricuspid stenosis. Aortic Valve: The aortic valve is normal in structure. Aortic valve regurgitation is not visualized. No aortic stenosis is present. Pulmonic Valve: The pulmonic valve was normal in structure. Pulmonic valve regurgitation is not visualized. No evidence of pulmonic stenosis. Aorta: The aortic root is normal in size and structure. Venous: The inferior vena cava is normal in size with greater than 50% respiratory variability, suggesting right atrial pressure of 3 mmHg. IAS/Shunts: No atrial level shunt detected by color flow Doppler. Candee Furbish MD Electronically signed by Candee Furbish MD Signature Date/Time: 03/01/2020/4:29:19 PM    Final    [place] EKG SITE RITE  Result Date: 03/02/2020 If Site Rite image not attached, placement could not be confirmed due to current cardiac  rhythm.   Assessment/Plan  1. MSSA bacteremia - continue Cefazolin  2 gm IV Q 8 hours with stop date 04/11/20, Rifampin 300 mg 1 capsule twice a day -  2. Lumbar degenerative disc disease - On 02/27/20, she had wound exploration/wound debridement. Both thoracic incision for a disc herniation and lumbar incision for a lumbar discectomy and arthrodesis were opened.  - continue PRN Tizanidine, Robaxin and Cyclobenzaprine for muscle spasm, PRN Percocet, Celebrex and Chlorzoxazone for pain - follow up with Dr. Ashok Pall, neurosurgery, in 2 weeks  3. Essential hypertension - continue Hydrochlorothiazide and Atenolol  4. Hypokalemia Lab Results  Component Value Date   K 3.9 03/05/2020   - continue Klor-Con  5. Chronic constipation - continue Amitiza  6. Anxiety - continue Hydroxyzine and Diazepam  7. Leg swelling - continue Furosemide  8. Neuropathy - continue Gabapentin      Family/ staff Communication:  Discussed plan of care with resident and charge nurse.  Labs/tests ordered:  CBC, BMP on 03/08/20  Goals of care:   Short-term care   Durenda Age, DNP, FNP-BC [facility] [phone] (Monday-Friday 8:00 a.m. - 5:00 p.m.) [phone] (after hours)

## 2020-03-09 ENCOUNTER — Non-Acute Institutional Stay (SKILLED_NURSING_FACILITY): Payer: Medicare Other | Admitting: Internal Medicine

## 2020-03-09 ENCOUNTER — Other Ambulatory Visit: Payer: Self-pay | Admitting: Adult Health

## 2020-03-09 ENCOUNTER — Encounter: Payer: Self-pay | Admitting: Internal Medicine

## 2020-03-09 DIAGNOSIS — Z9189 Other specified personal risk factors, not elsewhere classified: Secondary | ICD-10-CM | POA: Diagnosis not present

## 2020-03-09 DIAGNOSIS — B9561 Methicillin susceptible Staphylococcus aureus infection as the cause of diseases classified elsewhere: Secondary | ICD-10-CM

## 2020-03-09 DIAGNOSIS — I10 Essential (primary) hypertension: Secondary | ICD-10-CM

## 2020-03-09 DIAGNOSIS — Z79899 Other long term (current) drug therapy: Secondary | ICD-10-CM | POA: Insufficient documentation

## 2020-03-09 DIAGNOSIS — R7881 Bacteremia: Secondary | ICD-10-CM | POA: Diagnosis not present

## 2020-03-09 LAB — CBC AND DIFFERENTIAL
HCT: 29 — AB (ref 36–46)
Hemoglobin: 9.1 — AB (ref 12.0–16.0)
Neutrophils Absolute: 12
Platelets: 849 — AB (ref 150–399)
WBC: 17.8

## 2020-03-09 LAB — BASIC METABOLIC PANEL
BUN: 11 (ref 4–21)
CO2: 19 (ref 13–22)
Chloride: 93 — AB (ref 99–108)
Creatinine: 0.4 — AB (ref 0.5–1.1)
Glucose: 92
Potassium: 4.1 (ref 3.4–5.3)
Sodium: 134 — AB (ref 137–147)

## 2020-03-09 LAB — COMPREHENSIVE METABOLIC PANEL
Calcium: 10 (ref 8.7–10.7)
GFR calc Af Amer: 90
GFR calc non Af Amer: 90

## 2020-03-09 LAB — CBC: RBC: 4.02 (ref 3.87–5.11)

## 2020-03-09 MED ORDER — OXYCODONE-ACETAMINOPHEN 10-325 MG PO TABS: 1.0000 | ORAL_TABLET | Freq: Four times a day (QID) | ORAL | 0 refills | Status: DC | PRN

## 2020-03-09 NOTE — Assessment & Plan Note (Signed)
BP controlled; no change in antihypertensive medications  

## 2020-03-09 NOTE — Patient Instructions (Signed)
See assessment and plan under each diagnosis in the problem list and acutely for this visit 

## 2020-03-09 NOTE — Assessment & Plan Note (Signed)
PT/OT at SNF Continue antibiotic therapy as ordered by ID Neurosurgery follow-up in approximately 2 weeks.

## 2020-03-09 NOTE — Assessment & Plan Note (Addendum)
I have encouraged her to wean and discontinue as many of these medications as possible. In that vein I am decreasing the Flexeril over 4 days and then discontinuing it.  Tizanidine, the most recently prescribed muscle relaxant will be discontinued as well.The others have been maintenance therapy ,taken regularly.  Sedating medications hydroxyzine and Benadryl will also be discontinued.  Were she to fall; this will be pursued as aggressively as possible per standard of care.

## 2020-03-09 NOTE — Progress Notes (Signed)
NURSING HOME LOCATION:  [facility] ROOM NUMBER:  [id number]  CODE STATUS:  FULL CODE  PCP:  Alroy Dust, L.Marlou Sa, [place] [address] [place] [address]   This is a comprehensive admission note to [facility] performed on this date less than 30 days from date of admission. Included are preadmission medical/surgical history; reconciled medication list; family history; social history and comprehensive review of systems.  Corrections and additions to the records were documented. Comprehensive physical exam was also performed. Additionally a clinical summary was entered for each active diagnosis pertinent to this admission in the Problem List to enhance continuity of care.  HPI: Patient was hospitalized 5/22-03/07/2020 with possible MSSA bacteremia in the context of infected lumbar disc and arthrodesis.  Surgery involved both thoracic incision for disc herniation and lumbar incision for discectomy by Kathleene Hazel.  Purulence was noted at both sites.  Wound culture grew staph aureus.  Wound VAC was placed at the end of surgery.  Nafcillin and rifampin had been initiated for the possible bacteremia.  Blood cultures were negative at 5 days. She was discharged to the SNF for continuation of Ancef as well as PT/OT. Neurosurgical follow-up was to be in 2 weeks.  Past medical and surgical history: Includes seasonal rhinitis, memory deficits, IBS, thyroid disease, essential hypertension, chronic headache, GERD, fibromyalgia, history of asthma, chronic back pain, and depression. She has had multiple orthopedic and neurosurgical operative procedures. Social history: Nondrinker;former smoker.  Family history: Extensive family history reviewed.   Review of systems: Her major complaints relate to her pre-existing neuropathy associated with weakness.  She also describes chronic headaches for which she takes a muscle relaxant.  The med list includes for different muscle relaxants and  Valium as well as Benadryl, gabapentin, and hydroxyzine.  She is receiving these medications from at least 3 if not 4 different physicians. She has constipation in the context of IBS as well as Percocet. PT/OT staff report profound somnolence which impairs her ability to participate in rehab.  Constitutional: No fever, significant weight change Eyes: No redness, discharge, pain, vision change ENT/mouth: No nasal congestion, purulent discharge, earache, change in hearing, sore throat  Cardiovascular: No chest pain, palpitations, paroxysmal nocturnal dyspnea, claudication, edema  Respiratory: No cough, sputum production, hemoptysis, DOE, significant snoring, apnea  Gastrointestinal: No heartburn, dysphagia, abdominal pain, nausea /vomiting, rectal bleeding, melena Genitourinary: No dysuria, hematuria, pyuria, incontinence, nocturia Dermatologic: No rash, pruritus, change in appearance of skin Neurologic: No dizziness, syncope, seizures Psychiatric: No  insomnia, anorexia Endocrine: No change in hair/skin/nails, excessive thirst, excessive hunger, excessive urination  Hematologic/lymphatic: No significant bruising, lymphadenopathy, abnormal bleeding Allergy/immunology: No itchy/watery eyes, significant sneezing, urticaria, angioedema  Physical exam:   Pertinent or positive findings: She is lethargic and speech is slightly slurred; but she is very intelligent and can provide an excellent history when more awake.  Slight ptosis on the right.  Pupils are large; she is wearing contacts.  The pupils appears slightly irregular in contour.  She denies any ophthalmologic surgery.  She is completely edentulous.  Heart sounds are distant.  Breath sounds are decreased, this is slightly asymmetric, greater on the left.  Abdomen is protuberant.  Well-healed operative scars over the knees.  Dorsalis pedis pulses are stronger than posterior tibial pulses.  She is weak to opposition in all limbs, greatest in the  right upper extremity clinically.  Marked clubbing is suggested.  General appearance: Adequately nourished; no acute distress, increased work of breathing is present.  Lymphatic: No lymphadenopathy about the head, neck, axilla. Eyes: No conjunctival inflammation or lid edema is present. There is no scleral icterus. Ears:  External ear exam shows no significant lesions or deformities.   Nose:  External nasal examination shows no deformity or inflammation. Nasal mucosa are pink and moist without lesions, exudates Oral exam: Lips and gums are healthy appearing.There is no oropharyngeal erythema or exudate. Neck:  No thyromegaly, masses, tenderness noted.    Heart:  No gallop, murmur, click, rub.  Lungs:  without wheezes, rhonchi, rales, rubs. Abdomen: Bowel sounds are normal.  Abdomen is soft and nontender with no organomegaly, hernias, masses. GU: Deferred  Extremities:  No cyanosis, clubbing, edema. Neurologic exam: Balance, Rhomberg, finger to nose testing could not be completed due to clinical state Skin: Warm & dry w/o tenting. No significant lesions or rash.  See clinical summary under each active problem in the Problem List with associated updated therapeutic plan

## 2020-03-09 NOTE — Assessment & Plan Note (Signed)
Risk of respiratory suppression and death discussed with her.

## 2020-03-15 LAB — CBC AND DIFFERENTIAL
HCT: 30 — AB (ref 36–46)
Hemoglobin: 9.1 — AB (ref 12.0–16.0)
Neutrophils Absolute: 8
Platelets: 590 — AB (ref 150–399)
WBC: 13.4

## 2020-03-15 LAB — BASIC METABOLIC PANEL
BUN: 17 (ref 4–21)
CO2: 25 — AB (ref 13–22)
Chloride: 94 — AB (ref 99–108)
Creatinine: 0.5 (ref 0.5–1.1)
Glucose: 81
Potassium: 3.3 — AB (ref 3.4–5.3)
Sodium: 138 (ref 137–147)

## 2020-03-15 LAB — COMPREHENSIVE METABOLIC PANEL
Albumin: 3.9 (ref 3.5–5.0)
Calcium: 10.4 (ref 8.7–10.7)
GFR calc Af Amer: 90
GFR calc non Af Amer: 90
Globulin: 3.4

## 2020-03-15 LAB — POCT ERYTHROCYTE SEDIMENTATION RATE, NON-AUTOMATED: Sed Rate: 101

## 2020-03-15 LAB — HEPATIC FUNCTION PANEL
ALT: 8 (ref 7–35)
AST: 22 (ref 13–35)
Alkaline Phosphatase: 158 — AB (ref 25–125)
Bilirubin, Total: 0.2

## 2020-03-15 LAB — CBC: RBC: 4.06 (ref 3.87–5.11)

## 2020-03-16 ENCOUNTER — Other Ambulatory Visit: Payer: Self-pay

## 2020-03-16 ENCOUNTER — Encounter: Payer: Self-pay | Admitting: Infectious Diseases

## 2020-03-16 ENCOUNTER — Telehealth (INDEPENDENT_AMBULATORY_CARE_PROVIDER_SITE_OTHER): Payer: Medicare Other | Admitting: Infectious Diseases

## 2020-03-16 ENCOUNTER — Non-Acute Institutional Stay (SKILLED_NURSING_FACILITY): Payer: Medicare Other | Admitting: Adult Health

## 2020-03-16 ENCOUNTER — Encounter: Payer: Self-pay | Admitting: Adult Health

## 2020-03-16 DIAGNOSIS — R7881 Bacteremia: Secondary | ICD-10-CM

## 2020-03-16 DIAGNOSIS — M5136 Other intervertebral disc degeneration, lumbar region: Secondary | ICD-10-CM

## 2020-03-16 DIAGNOSIS — Z79899 Other long term (current) drug therapy: Secondary | ICD-10-CM | POA: Diagnosis not present

## 2020-03-16 DIAGNOSIS — T8142XD Infection following a procedure, deep incisional surgical site, subsequent encounter: Secondary | ICD-10-CM

## 2020-03-16 DIAGNOSIS — Z452 Encounter for adjustment and management of vascular access device: Secondary | ICD-10-CM

## 2020-03-16 DIAGNOSIS — B9561 Methicillin susceptible Staphylococcus aureus infection as the cause of diseases classified elsewhere: Secondary | ICD-10-CM

## 2020-03-16 DIAGNOSIS — Z792 Long term (current) use of antibiotics: Secondary | ICD-10-CM | POA: Diagnosis not present

## 2020-03-16 NOTE — Assessment & Plan Note (Signed)
Lumbar spine fusion site abscess s/p I&D now requiring wound vac therapy. Has appt with nsgy soon. Encouraged prioritizing protein supplementation, vitamin c and possibly adding zinc for wound healing.  She will continue on IV cefazolin and PO rifampin through July 6th as planned. With hardware she will need longer course of PO consolidative therapy thereafter with Cephalexin TID.  FU arranged near the end of therapy with Dr. Drue Second.  Will get labs from [facility] to review for safety and therapeutic response.

## 2020-03-16 NOTE — Assessment & Plan Note (Signed)
Weekly CBC, CMP, ESR, CRP - awaiting fax back from [facility].

## 2020-03-16 NOTE — Assessment & Plan Note (Signed)
Site unremarkable, well maintained and functioning as expected. Continue care and maintenance until planned end of IV antibiotics. SNF team to remove at completion.

## 2020-03-16 NOTE — Progress Notes (Signed)
Location:  [place] Room Number: [place] of Service:  SNF (31) Provider:  Durenda Age, DNP, FNP-BC  Patient Care Team: Alroy Dust, L.Marlou Sa, MD as PCP - General (Family Medicine) Ashok Pall, MD (Neurosurgery)  Extended Emergency Contact Information Primary Emergency Contact: Cooper,Renee Address: [address]          [place],  [address] [place] of [place] Phone: [phone] Mobile Phone: [phone] Relation: Sister Secondary Emergency Contact: Rayna Sexton Address: [address], [place] Phone: [phone] Mobile Phone: [phone] Relation: Sister  Code Status:  Full Code  Goals of care: Advanced Directive information Advanced Directives 02/20/2020  Does Patient Have a Medical Advance Directive? No  Would patient like information on creating a medical advance directive? No - Patient declined  Pre-existing out of facility DNR order (yellow form or pink MOST form) -     Chief Complaint  Patient presents with  . Acute Visit    Medication management    HPI:  Pt is a 59 y.o. female who is a short-term care resident of [facility]. She was admitted to [facility] on 03/07/20 post [facility] hospitalization 02/26/20 to 03/07/20 for MSSA bacteremia.  On 02/27/20, she had wound exploration/wound debridement.  Both thoracic incision for disc herniation and lumbar incision for a lumbar discectomy and arthrodesis were open.  Both had purulent drainage.  Wound VAC was placed.  She is currently on cefazolin IV and rifampin orally. She is requesting that her medications be changed to the same time as she takes them when she was home. She is requesting for Fexofenadine to be discontinued since it does not think it works for her. She has occasional cough and congestion and requested for PRN Mucinex. There are multiple allergies on her list and she  said that her current  calcium supplement has oyster shells coating which she might be allergic to. Family will bring her calcium supplement that she can take. She had a virtual conference with Infectious Disease today wherein Vitamin C and Zinc supplementation was recommended for wound healing.   Past Medical History:  Diagnosis Date  . Anemia   . Ankle syndesmosis disruption   . Anxiety    takes Ativan and Valium, after mother passed  . Arthritis    bilateral knees s/p knee replacement bilaterally  . Asthma    but pt states not severe enough to even have an inhaler  . Bronchitis   . Bruising    pt states unexplained d/t fibromyalgia  . Chronic back pain    2012 tailbone surgery and 3 lower discs.   . Closed fracture of distal end of right fibula and tibia   . Depression    from Fibromyalgia diagnosis; not taking medicine. since 2001  . Dizziness    rarely  . Fibromyalgia    diagnosed 2001  . GERD (gastroesophageal reflux disease)    Prilosec occasionally  . Headache(784.0)    "sinus headaches"  . History of hiatal hernia   . Hypertension    since 2013  . Hyperthyroidism    subclinical, no treatment; thyroid nodules  . IBS (irritable bowel syndrome)   . Impaired memory    states from fibromyalgia  . Insomnia    takes Ambien  . Jones fracture    left foot fifth metatarsal  . Multiple allergies    including latex, pet dander, shellfish, pet dander  .  Painful orthopaedic hardware right ankle 05/27/2016  . Seasonal allergies   . Shortness of breath    with exertion;pt states that its related to her weight  . Sore gums    this is why pt is on Amoxil-only takes for dental work  . Tachycardia   . Thyroid goiter   . Varicose vein    protrudes above skin-per pt;vein popped and bruised;ultrasound done to make sure that there were no clots;noclots were found   Past Surgical History:  Procedure Laterality Date  . ANTERIOR LUMBAR FUSION  09/20/2011   Procedure: ANTERIOR LUMBAR FUSION 1 LEVEL;   Surgeon: Winfield Cunas;  Location: [place] NEURO ORS;  Service: Neurosurgery;  Laterality: N/A;  Lumbar five-Sacral One Anterior Lumbar Interbody Fusion /Dr. Early to Approach   . CERVICAL DISC SURGERY     WITH TITANIUM PLATE IN NECK---LEFT SIDE  . CERVICAL SPINE SURGERY  03/24/2019  . CESAREAN SECTION    . fibroidectomy    . FRACTURE SURGERY  05/08/2010   Jones fracture left foot fifth metatarsal  . HARDWARE REMOVAL Right 05/28/2016   Procedure: RIGHT ANKLE HARDWARE REMOVAL;  Surgeon: Elsie Saas, MD;  Location: [place];  Service: Orthopedics;  Laterality: Right;  . IRRIGATION AND DEBRIDEMENT KNEE  04/09/2012   Procedure: IRRIGATION AND DEBRIDEMENT KNEE;  Surgeon: Yvette Rack., MD;  Location: [place];  Service: Orthopedics;  Laterality: Right;  . JOINT REPLACEMENT  01-27-2012   left total knee and Right total knee  . KNEE SURGERY  2005   Left knee arthroscopy  . NASAL SEPTOPLASTY W/ TURBINOPLASTY  2007   due to recurrent sinusitis  . ORIF ANKLE FRACTURE Right 06/21/2015   Procedure: OPEN REDUCTION INTERNAL FIXATION RIGHT DISTAL FIBULA  FRACTURE AND OPEN REDUCTION INTERNAL FIXATION SYNDESMOSIS ;  Surgeon: Elsie Saas, MD;  Location: [place];  Service: Orthopedics;  Laterality: Right;  . SHOULDER ARTHROSCOPY W/ ROTATOR CUFF REPAIR Right   . SHOULDER SURGERY Left   . SPINE SURGERY  2004   Cervical plate, ACDF  . STERIOD INJECTION  01/27/2012   Procedure: STEROID INJECTION;  Surgeon: Lorn Junes, MD;  Location: [place];  Service: Orthopedics;  Laterality: Right;  . TEE WITHOUT CARDIOVERSION N/A 03/01/2020   Procedure: TRANSESOPHAGEAL ECHOCARDIOGRAM (TEE);  Surgeon: Jerline Pain, MD;  Location: [facility] ENDOSCOPY;  Service: Cardiovascular;  Laterality: N/A;  . THORACIC DISCECTOMY  02/16/2020   Procedure: Thoracic ten-eleven Discectomy;  Surgeon: Ashok Pall, MD;  Location: [place];  Service: Neurosurgery;;  . TONSILLECTOMY  2007  . TOTAL KNEE ARTHROPLASTY   01/27/2012   Procedure: TOTAL KNEE ARTHROPLASTY;  Surgeon: Lorn Junes, MD;  Bilateral  . TOTAL KNEE ARTHROPLASTY  04/06/2012   Procedure: TOTAL KNEE ARTHROPLASTY;  Surgeon: Lorn Junes, MD;  Location: [place];  Service: Orthopedics;  Laterality: Right;  . TUBAL LIGATION    . UTERINE FIBROID SURGERY     mid 200s  . WOUND EXPLORATION N/A 02/27/2020   Procedure: WOUND EXPLORATION;  Surgeon: Ashok Pall, MD;  Location: [place];  Service: Neurosurgery;  Laterality: N/A;    Allergies  Allergen Reactions  . Monosodium Glutamate Anaphylaxis and Swelling    Eyes swollen shut, facial swelling, tongue swelling.  . Shellfish Allergy Anaphylaxis  . Celebrex [Celecoxib] Itching    Only allergic to generic brand  . Contrast Media [Iodinated Diagnostic Agents] Itching and Nausea Only    "could not walk"  . Diclofenac Itching    Can take  the name brand Voltaren  . Other Other (See Comments)    Pet dander, Mildew, Mold  Cannot tolerate generic DICLOFENAC GEL--MUST USE BRAND NAME ([place])   . Latex Itching and Rash    Latex glove with powder    Outpatient Encounter Medications as of 03/16/2020  Medication Sig  . albuterol (PROVENTIL HFA;VENTOLIN HFA) 108 (90 BASE) MCG/ACT inhaler Inhale 2 puffs into the lungs every 6 (six) hours as needed for wheezing or shortness of breath.  . AMITIZA 24 MCG capsule Take 24 mcg by mouth 2 (two) times daily with a meal.   . aspirin EC 81 MG tablet Take 81 mg by mouth daily.  Marland Kitchen atenolol (TENORMIN) 50 MG tablet Take 50 mg by mouth daily.  Marland Kitchen CALCIUM-VITAMIN D PO Take 1 tablet by mouth 2 (two) times daily.  Marland Kitchen ceFAZolin (ANCEF) IVPB Inject 2 g into the vein every 8 (eight) hours. Indication:  MSSA bacteremia/ lumbar infection First Dose: Yes Last Day of Therapy:  04/11/2020 Labs - Once weekly:  CBC/D and CMP, Labs - Every other week:  ESR and CRP Method of administration: IV Push Method of administration may be changed at the discretion of home  infusion pharmacist based upon assessment of the patient and/or caregiver's ability to self-administer the medication ordered.  . CELEBREX 200 MG capsule Take 200 mg by mouth 2 (two) times daily.  . chlorzoxazone (PARAFON) 500 MG tablet Take 500 mg by mouth 3 (three) times daily.  . cyclobenzaprine (FLEXERIL) 10 MG tablet Take 10 mg by mouth 3 (three) times daily.  . diazepam (VALIUM) 5 MG tablet Take 5 mg by mouth 2 (two) times daily.   . diclofenac sodium (VOLTAREN) 1 % GEL Apply 1 application topically 2 (two) times daily as needed (shoulder, arm and leg pain). For right shoulder and arm and legs  . furosemide (LASIX) 20 MG tablet Take 40 mg by mouth daily.   Marland Kitchen gabapentin (NEURONTIN) 600 MG tablet Take 1,200 mg by mouth 3 (three) times daily.   . hydrochlorothiazide (HYDRODIURIL) 25 MG tablet Take 1 tablet (25 mg total) by mouth daily. One tablet by mouth in the morning.  . hydrOXYzine (ATARAX/VISTARIL) 50 MG tablet Take 50 mg by mouth 2 (two) times daily.  . methocarbamol (ROBAXIN) 500 MG tablet Take 500 mg by mouth 4 (four) times daily.   . Multiple Vitamin (MULTIVITAMIN WITH MINERALS) TABS tablet Take 1 tablet by mouth daily.  Marland Kitchen omeprazole (PRILOSEC) 20 MG capsule Take 20 mg by mouth daily.   Marland Kitchen OVER THE COUNTER MEDICATION Take 1 tablet by mouth daily. Neuriva Brain Supplement  . oxyCODONE-acetaminophen (PERCOCET) 10-325 MG tablet Take 1 tablet by mouth every 6 (six) hours as needed for pain.  . potassium chloride (KLOR-CON) 10 MEQ tablet Take 10 mEq by mouth every other day.   . rifampin (RIFADIN) 300 MG capsule Take 1 capsule (300 mg total) by mouth 2 (two) times daily.  Marland Kitchen senna (SENOKOT) 8.6 MG TABS tablet Take 1 tablet by mouth 2 (two) times daily.  Marland Kitchen tiZANidine (ZANAFLEX) 4 MG tablet Take 1 tablet (4 mg total) by mouth every 6 (six) hours as needed for muscle spasms.   No facility-administered encounter medications on file as of 03/16/2020.    Review of Systems  GENERAL: No change  in appetite, no fatigue, no weight changes, no fever, chills or weakness MOUTH and THROAT: Denies oral discomfort, gingival pain or bleeding RESPIRATORY: no SOB, DOE, wheezing, hemoptysis CARDIAC: No chest pain, edema  or palpitations GI: No abdominal pain, diarrhea, constipation, heart burn, nausea or vomiting GU: Denies dysuria, frequency, hematuria, incontinence, or discharge NEUROLOGICAL: Denies dizziness, syncope, numbness, or headache PSYCHIATRIC: Denies feelings of depression or anxiety. No report of hallucinations, insomnia, paranoia, or agitation   Immunization History  Administered Date(s) Administered  . Td 05/08/2010   Pertinent  Health Maintenance Due  Topic Date Due  . COLONOSCOPY  Never done  . PAP SMEAR-Modifier  12/12/2018  . INFLUENZA VACCINE  05/07/2020  . MAMMOGRAM  10/10/2021   Fall Risk  03/16/2020  Falls in the past year? 0  Number falls in past yr: 0  Injury with Fall? 0     Vitals:   03/16/20 1624  BP: 122/71  Pulse: 60  Resp: 20  Temp: 97.8 F (36.6 C)  Weight: 202 lb 9.6 oz (91.9 kg)  Height: 5' 4"  (1.626 m)   Body mass index is 34.78 kg/m.  Physical Exam  GENERAL APPEARANCE: Well nourished. In no acute distress. Obese. SKIN:  Surgical wound on mid upper and lower back with wound vac MOUTH and THROAT: Lips are without lesions. Oral mucosa is moist and without lesions. Tongue is normal in shape, size, and color and without lesions RESPIRATORY: Breathing is even & unlabored, BS CTAB CARDIAC: RRR, no murmur,no extra heart sounds, no edema GI: Abdomen soft, normal BS, no masses, no tenderness EXTREMITIES: Able to move X 4 extremities. NEUROLOGICAL: There is no tremor. Speech is clear. Alert and oriented X 3. PSYCHIATRIC:  Affect and behavior are appropriate  Labs reviewed: Recent Labs    02/14/20 1612 02/16/20 1125 02/28/20 0139 03/02/20 0516 03/05/20 0602  NA 139   < > 140 138 138  K 2.4*   < > 4.5 3.2* 3.9  CL 100   < > 106 97* 101   CO2 22   < > 23 26 26   GLUCOSE 83   < > 166* 88 102*  BUN 13   < > 12 10 9   CREATININE 0.93   < > 0.69 0.62 0.56  CALCIUM 9.8   < > 8.3* 8.0* 8.3*  MG 2.3  --   --   --   --    < > = values in this interval not displayed.   Recent Labs    02/26/20 2122 03/02/20 0516  AST 41 9*  ALT 30 16  ALKPHOS 106 89  BILITOT 0.7 1.0  PROT 5.1* 4.8*  ALBUMIN 2.1* 1.6*   Recent Labs    02/14/20 1612 02/14/20 1612 02/26/20 2122 02/28/20 0139 03/02/20 0516  WBC 11.4*   < > 24.8* 19.1* 15.6*  NEUTROABS 5.8  --  22.0*  --  9.5*  HGB 12.4   < > 8.5* 8.1* 7.4*  HCT 41.7   < > 28.3* 26.4* 24.4*  MCV 77.1*   < > 77.3* 75.4* 76.3*  PLT 323   < > 341 321 378   < > = values in this interval not displayed.   No results found for: TSH Lab Results  Component Value Date   HGBA1C 6.0 (H) 02/29/2020   Lab Results  Component Value Date   CHOL 196 12/30/2013   HDL 70 12/30/2013   LDLCALC 106 (H) 12/30/2013   TRIG 102 12/30/2013   CHOLHDL 2.8 12/30/2013    Significant Diagnostic Results in last 30 days:  DG THORACOLUMABAR SPINE  Result Date: 02/16/2020 CLINICAL DATA:  L2-3 fusion with T10-11 discectomy EXAM: DG C-ARM 1-60 MIN; THORACOLUMBAR SPINE -  2 VIEW COMPARISON:  None. FLUOROSCOPY TIME:  Fluoroscopy Time:  1 minutes 44 seconds Radiation Exposure Index (if provided by the fluoroscopic device): Not available Number of Acquired Spot Images: 8 FINDINGS: Initial images demonstrate the pedicle screw placement at L2 with subsequent interbody fusion and connection to previously placed fusion hardware from L5. Subsequent localization of T10 was performed for discectomy at T10-T11. IMPRESSION: Lumbar fusion at L2-3 with subsequent thoracic discectomy at T10-T11. Electronically Signed   By: Inez Catalina M.D.   On: 02/16/2020 22:39   DG C-Arm 1-60 Min  Result Date: 02/16/2020 CLINICAL DATA:  L2-3 fusion with T10-11 discectomy EXAM: DG C-ARM 1-60 MIN; THORACOLUMBAR SPINE - 2 VIEW COMPARISON:  None.  FLUOROSCOPY TIME:  Fluoroscopy Time:  1 minutes 44 seconds Radiation Exposure Index (if provided by the fluoroscopic device): Not available Number of Acquired Spot Images: 8 FINDINGS: Initial images demonstrate the pedicle screw placement at L2 with subsequent interbody fusion and connection to previously placed fusion hardware from L5. Subsequent localization of T10 was performed for discectomy at T10-T11. IMPRESSION: Lumbar fusion at L2-3 with subsequent thoracic discectomy at T10-T11. Electronically Signed   By: Inez Catalina M.D.   On: 02/16/2020 22:39   ECHOCARDIOGRAM COMPLETE  Result Date: 02/29/2020    ECHOCARDIOGRAM REPORT   Patient Name:   PATTIJO JUSTE Date of Exam: 02/29/2020 Medical Rec #:  [id number]       Height:       64.0 in Accession #:    [id number]      Weight:       191.8 lb Date of Birth:  March 12, 1961      BSA:          1.922 m Patient Age:    10 years        BP:           125/71 mmHg Patient Gender: F               HR:           89 bpm. Exam Location:  Inpatient Procedure: 2D Echo, Cardiac Doppler and Color Doppler Indications:    Bacteremia  History:        Patient has no prior history of Echocardiogram examinations.                 Risk Factors:Hypertension and Former Smoker. GERD.  Sonographer:    Clayton Lefort RDCS (AE) Referring Phys: [place]  1. Left ventricular ejection fraction, by estimation, is 55 to 60%. The left ventricle has normal function. The left ventricle has no regional wall motion abnormalities. There is mild left ventricular hypertrophy. Left ventricular diastolic parameters are consistent with Grade I diastolic dysfunction (impaired relaxation).  2. Right ventricular systolic function is normal. The right ventricular size is normal. Tricuspid regurgitation signal is inadequate for assessing PA pressure.  3. Left atrial size was mildly dilated.  4. The mitral valve is normal in structure. No evidence of mitral valve regurgitation. No evidence of  mitral stenosis.  5. The aortic valve is tricuspid. Aortic valve regurgitation is not visualized. Mild aortic valve sclerosis is present, with no evidence of aortic valve stenosis.  6. The inferior vena cava is normal in size with greater than 50% respiratory variability, suggesting right atrial pressure of 3 mmHg. FINDINGS  Left Ventricle: Left ventricular ejection fraction, by estimation, is 55 to 60%. The left ventricle has normal function. The left ventricle has no regional wall motion abnormalities.  The left ventricular internal cavity size was normal in size. There is  mild left ventricular hypertrophy. Left ventricular diastolic parameters are consistent with Grade I diastolic dysfunction (impaired relaxation). Right Ventricle: The right ventricular size is normal. No increase in right ventricular wall thickness. Right ventricular systolic function is normal. Tricuspid regurgitation signal is inadequate for assessing PA pressure. Left Atrium: Left atrial size was mildly dilated. Right Atrium: Right atrial size was normal in size. Pericardium: There is no evidence of pericardial effusion. Mitral Valve: The mitral valve is normal in structure. There is mild calcification of the mitral valve leaflet(s). Mild mitral annular calcification. No evidence of mitral valve regurgitation. No evidence of mitral valve stenosis. MV peak gradient, 7.4 mmHg. The mean mitral valve gradient is 4.0 mmHg. Tricuspid Valve: The tricuspid valve is normal in structure. Tricuspid valve regurgitation is trivial. Aortic Valve: The aortic valve is tricuspid. Aortic valve regurgitation is not visualized. Mild aortic valve sclerosis is present, with no evidence of aortic valve stenosis. Aortic valve mean gradient measures 6.0 mmHg. Aortic valve peak gradient measures 13.2 mmHg. Aortic valve area, by VTI measures 1.64 cm. Pulmonic Valve: The pulmonic valve was normal in structure. Pulmonic valve regurgitation is trivial. Aorta: The aortic  root is normal in size and structure. Venous: The inferior vena cava is normal in size with greater than 50% respiratory variability, suggesting right atrial pressure of 3 mmHg. IAS/Shunts: No atrial level shunt detected by color flow Doppler.  LEFT VENTRICLE PLAX 2D LVIDd:         4.59 cm  Diastology LVIDs:         2.89 cm  LV e' lateral:   9.57 cm/s LV PW:         1.27 cm  LV E/e' lateral: 10.8 LV IVS:        1.30 cm  LV e' medial:    8.59 cm/s LVOT diam:     1.70 cm  LV E/e' medial:  12.0 LV SV:         50 LV SV Index:   26 LVOT Area:     2.27 cm  RIGHT VENTRICLE             IVC RV Basal diam:  2.59 cm     IVC diam: 1.61 cm RV S prime:     10.30 cm/s TAPSE (M-mode): 2.4 cm LEFT ATRIUM             Index       RIGHT ATRIUM           Index LA diam:        3.20 cm 1.67 cm/m  RA Area:     13.60 cm LA Vol (A2C):   29.6 ml 15.40 ml/m RA Volume:   36.00 ml  18.73 ml/m LA Vol (A4C):   60.4 ml 31.43 ml/m LA Biplane Vol: 42.9 ml 22.32 ml/m  AORTIC VALVE AV Area (Vmax):    1.67 cm AV Area (Vmean):   1.58 cm AV Area (VTI):     1.64 cm AV Vmax:           182.00 cm/s AV Vmean:          116.000 cm/s AV VTI:            0.304 m AV Peak Grad:      13.2 mmHg AV Mean Grad:      6.0 mmHg LVOT Vmax:         134.00 cm/s LVOT Vmean:  80.700 cm/s LVOT VTI:          0.219 m LVOT/AV VTI ratio: 0.72  AORTA Ao Root diam: 2.70 cm Ao Asc diam:  2.60 cm MITRAL VALVE MV Area (PHT): 4.17 cm     SHUNTS MV Peak grad:  7.4 mmHg     Systemic VTI:  0.22 m MV Mean grad:  4.0 mmHg     Systemic Diam: 1.70 cm MV Vmax:       1.36 m/s MV Vmean:      86.8 cm/s MV Decel Time: 182 msec MV E velocity: 103.00 cm/s MV A velocity: 102.00 cm/s MV E/A ratio:  1.01 Loralie Champagne MD Electronically signed by Loralie Champagne MD Signature Date/Time: 02/29/2020/2:56:17 PM    Final    ECHO TEE  Result Date: 03/01/2020    TRANSESOPHOGEAL ECHO REPORT   Patient Name:   CONNEE IKNER Date of Exam: 03/01/2020 Medical Rec #:  [id number]       Height:       64.0  in Accession #:    [id number]      Weight:       191.8 lb Date of Birth:  Mar 14, 1961      BSA:          1.922 m Patient Age:    36 years        BP:           110/52 mmHg Patient Gender: F               HR:           59 bpm. Exam Location:  Inpatient Procedure: 2D Echo Indications:     Bacteremia 790.7 / R78.81  History:         Patient has prior history of Echocardiogram examinations, most                  recent 02/29/2020. Risk Factors:Hypertension. MSSA bacteremia.  Sonographer:     Jaquita Folds Referring Phys:  [phone] JILL D MCDANIEL Diagnosing Phys: Candee Furbish MD PROCEDURE: The transesophogeal probe was passed without difficulty through the esophogus of the patient. Sedation performed by different physician. The patient was monitored while under deep sedation. Anesthestetic sedation was provided intravenously by Anesthesiology: 342m of Propofol. The patient's vital signs; including heart rate, blood pressure, and oxygen saturation; remained stable throughout the procedure. The patient developed no complications during the procedure. IMPRESSIONS  1. Left ventricular ejection fraction, by estimation, is 60 to 65%. The left ventricle has normal function. The left ventricle has no regional wall motion abnormalities.  2. Right ventricular systolic function is normal. The right ventricular size is normal.  3. No left atrial/left atrial appendage thrombus was detected.  4. The mitral valve is normal in structure. Mild mitral valve regurgitation. No evidence of mitral stenosis.  5. The aortic valve is normal in structure. Aortic valve regurgitation is not visualized. No aortic stenosis is present.  6. The inferior vena cava is normal in size with greater than 50% respiratory variability, suggesting right atrial pressure of 3 mmHg. Conclusion(s)/Recommendation(s): No evidence of vegetation/infective endocarditis on this transesophageal echocardiogram. FINDINGS  Left Ventricle: Left ventricular ejection fraction,  by estimation, is 60 to 65%. The left ventricle has normal function. The left ventricle has no regional wall motion abnormalities. The left ventricular internal cavity size was normal in size. There is  no left ventricular hypertrophy. Right Ventricle: The right ventricular size is normal. No increase in right ventricular wall  thickness. Right ventricular systolic function is normal. Left Atrium: Left atrial size was normal in size. No left atrial/left atrial appendage thrombus was detected. Right Atrium: Right atrial size was normal in size. Prominent Chiari network. Pericardium: There is no evidence of pericardial effusion. Mitral Valve: The mitral valve is normal in structure. Normal mobility of the mitral valve leaflets. Mild mitral valve regurgitation. No evidence of mitral valve stenosis. Tricuspid Valve: The tricuspid valve is normal in structure. Tricuspid valve regurgitation is trivial. No evidence of tricuspid stenosis. Aortic Valve: The aortic valve is normal in structure. Aortic valve regurgitation is not visualized. No aortic stenosis is present. Pulmonic Valve: The pulmonic valve was normal in structure. Pulmonic valve regurgitation is not visualized. No evidence of pulmonic stenosis. Aorta: The aortic root is normal in size and structure. Venous: The inferior vena cava is normal in size with greater than 50% respiratory variability, suggesting right atrial pressure of 3 mmHg. IAS/Shunts: No atrial level shunt detected by color flow Doppler. Candee Furbish MD Electronically signed by Candee Furbish MD Signature Date/Time: 03/01/2020/4:29:19 PM    Final    [place] EKG SITE RITE  Result Date: 03/02/2020 If Site Rite image not attached, placement could not be confirmed due to current cardiac rhythm.   Assessment/Plan  1. Medication management - will change times of drug administration same as home medications - discontinue Fexofenadine and current calcium supplement - Family will bring Calcium supplement  that she takes at home   2. MSSA bacteremia -Continue cefazolin 2 g IV Q 8 hours with stop date 04/11/20 and rifampin 300 mg 1 capsule twice a day  - followed up with [facility] today, 03/16/20  3.  Lumbar degenerative disc disease - S/P wound exploration/wound debridement on 02/27/2020, both thoracic incision for a disc herniation and lumbar incision for a lumbar discectomy and arthrodesis were opened -We will start on zinc sulfate 220 mg 1 capsule daily and vitamin C 500 mg 1 tab daily for wound healing -Continue wound VAC  -Follow-up with neurosurgery, Dr. Ashok Pall    Family/ staff Communication:  Discussed plan of care with resident and charge nurse.  Labs/tests ordered:  None  Goals of care:  Short-term care   Durenda Age, DNP, FNP-BC [facility] [phone] (Monday-Friday 8:00 a.m. - 5:00 p.m.) [phone] (after hours)

## 2020-03-16 NOTE — Progress Notes (Signed)
Patient: [name]  DOB: 1961/08/11 MRN: [id number] PCP: Aurea Graff.Marlou Sa, MD    VIRTUAL CARE ENCOUNTER  I connected with [name] on 03/16/20 at 11:15 AM EDT by Telephone and verified that I am speaking with the correct person using two identifiers.   I discussed the limitations, risks, security and privacy concerns of performing an evaluation and management service by telephone and the availability of in person appointments. I also discussed with the patient that there may be a patient responsible charge related to this service. The patient expressed understanding and agreed to proceed.  Patient Location: [facility]  Other Participants: none  Provider Location: [facility] Office    Patient Active Problem List   Diagnosis Date Noted  . MSSA bacteremia 02/29/2020    Priority: High  . Postoperative infection 02/28/2020    Priority: High  . PICC (peripherally inserted central catheter) in place 03/16/2020  . Encounter for long-term (current) use of antibiotics 03/16/2020  . Polypharmacy 03/09/2020  . At risk for adverse drug event 03/09/2020  . Abnormal gait due to muscle weakness 02/26/2020  . HNP (herniated nucleus pulposus), lumbar 02/16/2020  . Muscle weakness of lower extremity 02/14/2020  . Spondylolisthesis of lumbar region 10/30/2017  . Painful orthopaedic hardware right ankle 05/27/2016  . Closed fracture of distal end of right fibula and tibia   . Ankle syndesmosis disruption   . ASCUS favor benign 01/06/2014  . Leg swelling 12/22/2013  . Seasonal allergies   . Anemia   . Chronic back pain due to DJD with history surgery   . Hyperthyroidism   . 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 Status post left total prosthetic replacement of knee joint using cement 03/24/2012  . Fibromyalgia   . GERD (gastroesophageal reflux disease)   . Anxiety   . Depression   . Hypertension   . Lumbar degenerative disc disease 09/25/2011      Subjective:  [name] is a 59 y.o. female here for follow up on MSSA vertebral infection following a recent fusion complicated by hardware and bacteremia. She underwent wound exploration with last procedure done 5/23. TEE was performed as well on 5/26 and ruled out endocarditis. After bacteremia was cleared she had PICC placed and discharged to [facility] for wound care, rehab and IV antibiotics. She is currently taking Cefazolin through PICC and oral Rifampin scheduled to stop 7/06.   Still using the wound vac for spine incision - switched out yesterday. "Appears to be healing but not as fast as she would like." She does not have much drainage and describes it to be red tinged. She is "watching what she eats" and has tried to take in more protein with shakes/supplements. She is taking Vitamin C.   IV antibiotics are going well - she feels like she is in the bathroom often. She does have challenges with constipation  She takes 2 different softeners for this and trying to increase water intake. She still has pain but it is not as back as in the hospital and feels it's slowly getting better. She has follow up with Dr. Christella Noa soon.   PICC line is without pain, drainage or erythema and is well maintained by [facility] Team. No swelling or altered sensation in affected distal extremity.     Review of Systems  Constitutional: Negative for chills and fever.  HENT: Negative for tinnitus.   Eyes: Negative for blurred vision and photophobia.  Respiratory: Negative  for cough and sputum production.   Cardiovascular: Negative for chest pain.  Gastrointestinal: Positive for constipation. Negative for abdominal pain, diarrhea, nausea and vomiting.  Genitourinary: Negative for dysuria.  Musculoskeletal: Positive for back pain.  Skin: Negative for rash.  Neurological: Negative for headaches.    Past Medical History:  Diagnosis Date  . Anemia   . Ankle syndesmosis disruption   . Anxiety      takes Ativan and Valium, after mother passed  . Arthritis    bilateral knees s/p knee replacement bilaterally  . Asthma    but pt states not severe enough to even have an inhaler  . Bronchitis   . Bruising    pt states unexplained d/t fibromyalgia  . Chronic back pain    2012 tailbone surgery and 3 lower discs.   . Closed fracture of distal end of right fibula and tibia   . Depression    from Fibromyalgia diagnosis; not taking medicine. since 2001  . Dizziness    rarely  . Fibromyalgia    diagnosed 2001  . GERD (gastroesophageal reflux disease)    Prilosec occasionally  . Headache(784.0)    "sinus headaches"  . History of hiatal hernia   . Hypertension    since 2013  . Hyperthyroidism    subclinical, no treatment; thyroid nodules  . IBS (irritable bowel syndrome)   . Impaired memory    states from fibromyalgia  . Insomnia    takes Ambien  . Jones fracture    left foot fifth metatarsal  . Multiple allergies    including latex, pet dander, shellfish, pet dander  . Painful orthopaedic hardware right ankle 05/27/2016  . Seasonal allergies   . Shortness of breath    with exertion;pt states that its related to her weight  . Sore gums    this is why pt is on Amoxil-only takes for dental work  . Tachycardia   . Thyroid goiter   . Varicose vein    protrudes above skin-per pt;vein popped and bruised;ultrasound done to make sure that there were no clots;noclots were found    Outpatient Medications Prior to Visit  Medication Sig Dispense Refill  . albuterol (PROVENTIL HFA;VENTOLIN HFA) 108 (90 BASE) MCG/ACT inhaler Inhale 2 puffs into the lungs every 6 (six) hours as needed for wheezing or shortness of breath. 1 Inhaler 3  . AMITIZA 24 MCG capsule Take 24 mcg by mouth 2 (two) times daily with a meal.     . aspirin EC 81 MG tablet Take 81 mg by mouth daily.    Marland Kitchen atenolol (TENORMIN) 50 MG tablet Take 50 mg by mouth daily.    Marland Kitchen CALCIUM-VITAMIN D PO Take 1 tablet by mouth 2  (two) times daily.    Marland Kitchen ceFAZolin (ANCEF) IVPB Inject 2 g into the vein every 8 (eight) hours. Indication:  MSSA bacteremia/ lumbar infection First Dose: Yes Last Day of Therapy:  04/11/2020 Labs - Once weekly:  CBC/D and CMP, Labs - Every other week:  ESR and CRP Method of administration: IV Push Method of administration may be changed at the discretion of home infusion pharmacist based upon assessment of the patient and/or caregiver's ability to self-administer the medication ordered. 320 Units 0  . CELEBREX 200 MG capsule Take 200 mg by mouth 2 (two) times daily.    . chlorzoxazone (PARAFON) 500 MG tablet Take 500 mg by mouth 3 (three) times daily.    . cyclobenzaprine (FLEXERIL) 10 MG tablet Take 10 mg by  mouth 3 (three) times daily.    . diazepam (VALIUM) 5 MG tablet Take 5 mg by mouth 2 (two) times daily.     . diclofenac sodium (VOLTAREN) 1 % GEL Apply 1 application topically 2 (two) times daily as needed (shoulder, arm and leg pain). For right shoulder and arm and legs 100 g 3  . furosemide (LASIX) 20 MG tablet Take 40 mg by mouth daily.     Marland Kitchen gabapentin (NEURONTIN) 600 MG tablet Take 1,200 mg by mouth 3 (three) times daily.   3  . hydrochlorothiazide (HYDRODIURIL) 25 MG tablet Take 1 tablet (25 mg total) by mouth daily. One tablet by mouth in the morning. 30 tablet 11  . hydrOXYzine (ATARAX/VISTARIL) 50 MG tablet Take 50 mg by mouth 2 (two) times daily.    . methocarbamol (ROBAXIN) 500 MG tablet Take 500 mg by mouth 4 (four) times daily.     . Multiple Vitamin (MULTIVITAMIN WITH MINERALS) TABS tablet Take 1 tablet by mouth daily.    Marland Kitchen omeprazole (PRILOSEC) 20 MG capsule Take 20 mg by mouth daily.     Marland Kitchen OVER THE COUNTER MEDICATION Take 1 tablet by mouth daily. Neuriva Brain Supplement    . oxyCODONE-acetaminophen (PERCOCET) 10-325 MG tablet Take 1 tablet by mouth every 6 (six) hours as needed for pain. 60 tablet 0  . potassium chloride (KLOR-CON) 10 MEQ tablet Take 10 mEq by mouth every  other day.     . rifampin (RIFADIN) 300 MG capsule Take 1 capsule (300 mg total) by mouth 2 (two) times daily. 80 capsule 0  . senna (SENOKOT) 8.6 MG TABS tablet Take 1 tablet by mouth 2 (two) times daily.    Marland Kitchen tiZANidine (ZANAFLEX) 4 MG tablet Take 1 tablet (4 mg total) by mouth every 6 (six) hours as needed for muscle spasms. 30 tablet 0   No facility-administered medications prior to visit.     Allergies  Allergen Reactions  . Monosodium Glutamate Anaphylaxis and Swelling    Eyes swollen shut, facial swelling, tongue swelling.  . Shellfish Allergy Anaphylaxis  . Celebrex [Celecoxib] Itching    Only allergic to generic brand  . Contrast Media [Iodinated Diagnostic Agents] Itching and Nausea Only    "could not walk"  . Diclofenac Itching    Can take the name brand Voltaren  . Other Other (See Comments)    Pet dander, Mildew, Mold  Cannot tolerate generic DICLOFENAC GEL--MUST USE BRAND NAME ([place]anson)   . Latex Itching and Rash    Latex glove with powder    Social History   Tobacco Use  . Smoking status: Former Smoker    Packs/day: 0.50    Years: 10.00    Pack years: 5.00    Quit date: 12/06/1998    Years since quitting: 21.2  . Smokeless tobacco: Never Used  Vaping Use  . Vaping Use: Never used  Substance Use Topics  . Alcohol use: No  . Drug use: No    Family History  Problem Relation Age of Onset  . Stroke Father        passed 2004 from pneumonia  . Alcohol abuse Father   . Pulmonary embolism Mother        after minor knee surgery leading to DVT  . Arthritis Sister   . Depression Sister   . Hypertension Sister   . Diabetes Other        grandmother  . Anesthesia problems Neg Hx   . Hypotension Neg  Hx   . Malignant hyperthermia Neg Hx   . Pseudochol deficiency Neg Hx     Objective:  There were no vitals filed for this visit. There is no height or weight on file to calculate BMI.  Physical Exam Pulmonary:     Effort: Pulmonary effort is  normal.     Comments: No shortness of breath detected in conversation.  Neurological:     Mental Status: She is oriented to person, place, and time.  Psychiatric:        Mood and Affect: Mood normal.        Behavior: Behavior normal.        Thought Content: Thought content normal.        Judgment: Judgment normal.     Lab Results: Lab Results  Component Value Date   WBC 15.6 (H) 03/02/2020   HGB 7.4 (L) 03/02/2020   HCT 24.4 (L) 03/02/2020   MCV 76.3 (L) 03/02/2020   PLT 378 03/02/2020    Lab Results  Component Value Date   CREATININE 0.56 03/05/2020   BUN 9 03/05/2020   NA 138 03/05/2020   K 3.9 03/05/2020   CL 101 03/05/2020   CO2 26 03/05/2020    Lab Results  Component Value Date   ALT 16 03/02/2020   AST 9 (L) 03/02/2020   ALKPHOS 89 03/02/2020   BILITOT 1.0 03/02/2020     Assessment & Plan:   Problem List Items Addressed This Visit      High   Postoperative infection - Primary    Lumbar spine fusion site abscess s/p I&D now requiring wound vac therapy. Has appt with nsgy soon. Encouraged prioritizing protein supplementation, vitamin c and possibly adding zinc for wound healing.  She will continue on IV cefazolin and PO rifampin through July 6th as planned. With hardware she will need longer course of PO consolidative therapy thereafter with Cephalexin TID.  FU arranged near the end of therapy with Dr. Baxter Flattery.  Will get labs from [facility] to review for safety and therapeutic response.       MSSA bacteremia    Ruled out endocarditis - secondary to deep vertebral infection. No findings concerning for relapsing bacteremia. Continue current plan of care.         Unprioritized   PICC (peripherally inserted central catheter) in place    Site unremarkable, well maintained and functioning as expected. Continue care and maintenance until planned end of IV antibiotics. SNF team to remove at completion.        Encounter for long-term (current) use of antibiotics     Weekly CBC, CMP, ESR, CRP - awaiting fax back from [facility].         Follow Up Instructions: Continue Cefazolin IV and PO Rifampin through July 6 Get labs from [facility] Follow up with Nsgy pending FU with Dr. Baxter Flattery at the end of therapy to transition to orals.      I discussed the assessment and treatment plan with the patient. The patient was provided an opportunity to ask questions and all were answered. The patient agreed with the plan and demonstrated an understanding of the instructions.   The patient was advised to call back or seek an in-person evaluation if the symptoms worsen or if the condition fails to improve as anticipated.  I provided 15 minutes of non-face-to-face time during this encounter.   Janene Madeira, MSN, NP-C [facility] for Infectious Disease [email or web address] Pager: [phone] Office: [phone] [facility]  Main Line: [phone]   03/16/20  1:44 PM

## 2020-03-16 NOTE — Assessment & Plan Note (Signed)
Ruled out endocarditis - secondary to deep vertebral infection. No findings concerning for relapsing bacteremia. Continue current plan of care.

## 2020-03-20 NOTE — Discharge Summary (Signed)
Physician Discharge Summary  Patient ID: [name] MRN: [id number] DOB/AGE: 58/03/1961 58 y.o.  Admit date: 02/14/2020 Discharge date: 03/20/2020  Admission Diagnoses:HNP L2/3, T10/11 hnp  Discharge Diagnoses: same Active Problems:   Muscle weakness of lower extremity   HNP (herniated nucleus pulposus), lumbar   Discharged Condition: good  Hospital Course: [name] was admitted and taken to the operating room for a thoracic disectomy and lumbar discetomy and arthrodesis extension to L2. Post op [name] reports less pain, she remains weak in the lower extremities. Wounds are clean, dry, no signs of infection. Will be discharged to home with home health. She is currently voiding, and tolerating a regular diet.   Treatments: surgery: Lumbar 2-3 Posterior lumbar interbody fusion, Titanium cages 9 x26 mm packed with autograft morsels, and in the disc space Laminectomy L2 in excess of the needed exposure for a plif.  Segmental pedicle screw fixation L2-4 Thoracic ten-eleven Discectomy, left   Discharge Exam: Blood pressure (!) 115/58, pulse 65, temperature 98.2 F (36.8 C), temperature source Oral, resp. rate 18, height 5\' 4"  (1.626 m), weight 87 kg, last menstrual period 02/18/2016, SpO2 100 %. General appearance: alert, cooperative, appears stated age and mild distress Neurologic: Mental status: Alert, oriented, thought content appropriate Cranial nerves: normal Motor: weakness lower extremities, difficulty walking with assist Gait: Abnormal  Disposition: Discharge disposition: 01-Home or Self Care      Lumbar herniated disc, Thoracic herniated disc, Facet arthropathy  Allergies as of 02/22/2020      Reactions   Monosodium Glutamate Anaphylaxis, Swelling   Eyes swollen shut, facial swelling, tongue swelling.   Shellfish Allergy Anaphylaxis   Celebrex [celecoxib] Itching   Only allergic to generic brand   Contrast Media [iodinated Diagnostic Agents] Itching,  Nausea Only   "could not walk"   Diclofenac Itching   Can take the name brand Voltaren   Other Other (See Comments)   Pet dander, Mildew, Mold Cannot tolerate generic DICLOFENAC GEL--MUST USE BRAND NAME (GENERIC CAUSES ITCHING)   Latex Itching, Rash   Latex glove with powder      Medication List    TAKE these medications   albuterol 108 (90 Base) MCG/ACT inhaler Commonly known as: VENTOLIN HFA Inhale 2 puffs into the lungs every 6 (six) hours as needed for wheezing or shortness of breath.   Amitiza 24 MCG capsule Generic drug: lubiprostone Take 24 mcg by mouth 2 (two) times daily with a meal.   aspirin EC 81 MG tablet Take 81 mg by mouth daily.   atenolol 50 MG tablet Commonly known as: TENORMIN Take 50 mg by mouth daily.   CALCIUM-VITAMIN D PO Take 1 tablet by mouth 2 (two) times daily.   CeleBREX 200 MG capsule Generic drug: celecoxib Take 200 mg by mouth 2 (two) times daily.   chlorzoxazone 500 MG tablet Commonly known as: PARAFON Take 500 mg by mouth 3 (three) times daily.   cyclobenzaprine 10 MG tablet Commonly known as: FLEXERIL Take 10 mg by mouth 3 (three) times daily.   diazepam 5 MG tablet Commonly known as: VALIUM Take 5 mg by mouth 2 (two) times daily.   diclofenac sodium 1 % Gel Commonly known as: VOLTAREN Apply 1 application topically 2 (two) times daily as needed (shoulder, arm and leg pain). For right shoulder and arm and legs   furosemide 20 MG tablet Commonly known as: LASIX Take 40 mg by mouth daily.   gabapentin 600 MG tablet Commonly known as: NEURONTIN Take 1,200 mg  by mouth 3 (three) times daily.   hydrochlorothiazide 25 MG tablet Commonly known as: HYDRODIURIL Take 1 tablet (25 mg total) by mouth daily. One tablet by mouth in the morning.   hydrOXYzine 50 MG tablet Commonly known as: ATARAX/VISTARIL Take 50 mg by mouth 2 (two) times daily.   methocarbamol 500 MG tablet Commonly known as: ROBAXIN Take 500 mg by mouth 4  (four) times daily.   multivitamin with minerals Tabs tablet Take 1 tablet by mouth daily.   omeprazole 20 MG capsule Commonly known as: PRILOSEC Take 20 mg by mouth daily.   OVER THE COUNTER MEDICATION Take 1 tablet by mouth daily. Neuriva Brain Supplement   senna 8.6 MG Tabs tablet Commonly known as: SENOKOT Take 1 tablet by mouth 2 (two) times daily.   tiZANidine 4 MG tablet Commonly known as: Zanaflex Take 1 tablet (4 mg total) by mouth every 6 (six) hours as needed for muscle spasms.       Follow-up Information    [facility] Follow up.   Specialty: Home Health Services Why: The home health agency will contact you for the first home visit. Contact information: [address] [name] [address] [phone]        Coletta Memos, MD Follow up in 3 week(s).   Specialty: Neurosurgery Why: please call to make an appointment Contact information: [address] [name] [address] [phone]               Signed: Coletta Memos 03/20/2020, 6:26 PM

## 2020-03-22 ENCOUNTER — Other Ambulatory Visit: Payer: Self-pay | Admitting: Adult Health

## 2020-03-22 LAB — CBC AND DIFFERENTIAL
HCT: 31 — AB (ref 36–46)
Hemoglobin: 9.5 — AB (ref 12.0–16.0)
Neutrophils Absolute: 9
Platelets: 276 (ref 150–399)
WBC: 13.4

## 2020-03-22 LAB — COMPREHENSIVE METABOLIC PANEL
Albumin: 3.7 (ref 3.5–5.0)
Calcium: 10.2 (ref 8.7–10.7)
GFR calc Af Amer: 90
GFR calc non Af Amer: 90
Globulin: 2.7

## 2020-03-22 LAB — HEPATIC FUNCTION PANEL
ALT: 5 — AB (ref 7–35)
AST: 22 (ref 13–35)
Alkaline Phosphatase: 192 — AB (ref 25–125)
Bilirubin, Total: 0.2

## 2020-03-22 LAB — BASIC METABOLIC PANEL
BUN: 16 (ref 4–21)
CO2: 27 — AB (ref 13–22)
Chloride: 95 — AB (ref 99–108)
Creatinine: 0.4 — AB (ref 0.5–1.1)
Glucose: 110
Potassium: 2.9 — AB (ref 3.4–5.3)
Sodium: 138 (ref 137–147)

## 2020-03-22 LAB — CBC: RBC: 4.22 (ref 3.87–5.11)

## 2020-03-22 MED ORDER — DIAZEPAM 5 MG PO TABS: 5.0000 mg | ORAL_TABLET | Freq: Two times a day (BID) | ORAL | 0 refills | Status: DC

## 2020-03-22 MED ORDER — OXYCODONE-ACETAMINOPHEN 10-325 MG PO TABS: 1.0000 | ORAL_TABLET | Freq: Four times a day (QID) | ORAL | 0 refills | Status: DC | PRN

## 2020-03-24 ENCOUNTER — Other Ambulatory Visit: Payer: Self-pay

## 2020-03-24 ENCOUNTER — Encounter: Payer: Self-pay | Admitting: Adult Health

## 2020-03-24 ENCOUNTER — Non-Acute Institutional Stay (SKILLED_NURSING_FACILITY): Payer: Medicare Other | Admitting: Adult Health

## 2020-03-24 DIAGNOSIS — F419 Anxiety disorder, unspecified: Secondary | ICD-10-CM

## 2020-03-24 DIAGNOSIS — B9561 Methicillin susceptible Staphylococcus aureus infection as the cause of diseases classified elsewhere: Secondary | ICD-10-CM

## 2020-03-24 DIAGNOSIS — I10 Essential (primary) hypertension: Secondary | ICD-10-CM

## 2020-03-24 DIAGNOSIS — E876 Hypokalemia: Secondary | ICD-10-CM | POA: Diagnosis not present

## 2020-03-24 DIAGNOSIS — R7881 Bacteremia: Secondary | ICD-10-CM

## 2020-03-24 DIAGNOSIS — M7989 Other specified soft tissue disorders: Secondary | ICD-10-CM

## 2020-03-24 DIAGNOSIS — G629 Polyneuropathy, unspecified: Secondary | ICD-10-CM

## 2020-03-24 DIAGNOSIS — T8142XD Infection following a procedure, deep incisional surgical site, subsequent encounter: Secondary | ICD-10-CM

## 2020-03-24 DIAGNOSIS — K5909 Other constipation: Secondary | ICD-10-CM

## 2020-03-24 MED ORDER — ALBUTEROL SULFATE HFA 108 (90 BASE) MCG/ACT IN AERS: 2.0000 | INHALATION_SPRAY | Freq: Four times a day (QID) | RESPIRATORY_TRACT | 0 refills | Status: AC | PRN

## 2020-03-24 MED ORDER — CALCIUM 600 + D 600-200 MG-UNIT PO TABS
1.0000 | ORAL_TABLET | Freq: Two times a day (BID) | ORAL | 0 refills | Status: DC
Start: 2020-03-24 — End: 2020-11-29

## 2020-03-24 MED ORDER — CHLORZOXAZONE 500 MG PO TABS: 500.0000 mg | ORAL_TABLET | Freq: Three times a day (TID) | ORAL | 0 refills | Status: DC

## 2020-03-24 MED ORDER — CELEBREX 200 MG PO CAPS: 200.0000 mg | ORAL_CAPSULE | Freq: Two times a day (BID) | ORAL | 0 refills | Status: DC

## 2020-03-24 MED ORDER — POTASSIUM CHLORIDE ER 10 MEQ PO TBCR: 10.0000 meq | EXTENDED_RELEASE_TABLET | Freq: Every day | ORAL | 0 refills | Status: DC

## 2020-03-24 MED ORDER — LUBIPROSTONE 24 MCG PO CAPS: 24.0000 ug | ORAL_CAPSULE | Freq: Two times a day (BID) | ORAL | 0 refills | Status: AC

## 2020-03-24 MED ORDER — ATENOLOL 50 MG PO TABS: 50.0000 mg | ORAL_TABLET | Freq: Every day | ORAL | 0 refills | Status: AC

## 2020-03-24 MED ORDER — RIFAMPIN 300 MG PO CAPS: 300.0000 mg | ORAL_CAPSULE | Freq: Two times a day (BID) | ORAL | 0 refills | Status: DC

## 2020-03-24 MED ORDER — HYDROCHLOROTHIAZIDE 25 MG PO TABS: 25.0000 mg | ORAL_TABLET | Freq: Every day | ORAL | 0 refills | Status: DC

## 2020-03-24 MED ORDER — METHOCARBAMOL 500 MG PO TABS: 500.0000 mg | ORAL_TABLET | Freq: Four times a day (QID) | ORAL | 0 refills | Status: DC

## 2020-03-24 MED ORDER — DEXTROSE 5 % IV SOLN: 2.0000 g | Freq: Three times a day (TID) | INTRAVENOUS | 0 refills | Status: DC

## 2020-03-24 MED ORDER — FUROSEMIDE 20 MG PO TABS: 40.0000 mg | ORAL_TABLET | Freq: Every day | ORAL | 0 refills | Status: DC

## 2020-03-24 MED ORDER — GABAPENTIN 600 MG PO TABS: 1200.0000 mg | ORAL_TABLET | Freq: Three times a day (TID) | ORAL | 0 refills | Status: DC

## 2020-03-24 MED ORDER — CEFAZOLIN IV (FOR PTA / DISCHARGE USE ONLY): 2.0000 g | Freq: Three times a day (TID) | INTRAVENOUS | 0 refills | Status: DC

## 2020-03-24 NOTE — Addendum Note (Signed)
Addended by: Karin Lieu T on: 03/24/2020 03:23 PM   Modules accepted: Orders

## 2020-03-24 NOTE — Progress Notes (Signed)
This encounter was created in error - please disregard.

## 2020-03-24 NOTE — Addendum Note (Signed)
Addended by: Karin Lieu T on: 03/24/2020 04:56 PM   Modules accepted: Orders

## 2020-03-24 NOTE — Progress Notes (Signed)
Location:  [place] Room Number: 119-A Place of Service:  SNF (31) Provider:  Durenda Age, DNP, FNP-BC  Patient Care Team: Alroy Dust, L.Marlou Sa, MD as PCP - General (Family Medicine) Ashok Pall, MD (Neurosurgery)  Extended Emergency Contact Information Primary Emergency Contact: Cooper,Renee Address: [address]          [place], [place] [address] [place]itter of [place] Phone: [phone] Mobile Phone: [phone] Relation: Sister Secondary Emergency Contact: Rayna Sexton Address: [address], [place] Phone: [phone] Mobile Phone: [phone] Relation: Sister  Code Status:  FULL CODE  Goals of care: Advanced Directive information Advanced Directives 02/20/2020  Does Patient Have a Medical Advance Directive? No  Would patient like information on creating a medical advance directive? No - Patient declined  Pre-existing out of facility DNR order (yellow form or pink MOST form) -     Chief Complaint  Patient presents with  . Discharge Note    Patient is seen for discharge from SNF    HPI:  Pt is a 59 y.o. female who is for discharge home on 03/26/20 with Home health PT, OT and Nurse for PICC line care, IV antibiotics and wound treatment.  She was admitted to [facility] on 08/02/20 post [facility] hospitalization 02/26/2020 to 03/07/20 for MSSA bacteremia.  She has a PMH of chronic back pain, GERD, hypertension, hypothyroidism, depression/anxiety and impaired memory.  On 02/16/2020, she had a posterior lumbar fusion L2-3 by Dr. Christella Noa.  She was discharged from the hospital on 5/18.  On day of discharge, patient reported noticing drainage at her incision.  Dr. Cyndy Freeze then resutured her and discharged on the same day.  Patient reported that her incision site continued to drain while at home.  Family member noticed excessive drainage and patient experienced a few  minutes of numbness in BUE and BLE which disappeared by applying Voltaren gel and called Dr. Christella Noa who advised them to go to ED.  On 02/27/2020, she had wound exploration/wound debridement.  Both thoracic incision for a disc herniation and lumbar incision for a lumbar discectomy and arthrodesis were opened.  Both had purulent drainage.  Wound VAC was placed.  PICC line was placed.  Blood cultures were negative after starting nafcillin and rifampin.  Discussed discharge orders with Ms. Cutler.  She said that she has a caregiver who has some training in giving IV antibiotics.    Patient was admitted to this facility for short-term rehabilitation after the patient's recent hospitalization.  Patient has completed SNF rehabilitation and therapy has cleared the patient for discharge.   Past Medical History:  Diagnosis Date  . Anemia   . Ankle syndesmosis disruption   . Anxiety    takes Ativan and Valium, after mother passed  . Arthritis    bilateral knees s/p knee replacement bilaterally  . Asthma    but pt states not severe enough to even have an inhaler  . Bronchitis   . Bruising    pt states unexplained d/t fibromyalgia  . Chronic back pain    2012 tailbone surgery and 3 lower discs.   . Closed fracture of distal end of right fibula and tibia   . Depression    from Fibromyalgia diagnosis; not taking medicine. since 2001  . Dizziness    rarely  . Fibromyalgia    diagnosed 2001  . GERD (gastroesophageal reflux disease)    Prilosec occasionally  .  Headache(784.0)    "sinus headaches"  . History of hiatal hernia   . Hypertension    since 2013  . Hyperthyroidism    subclinical, no treatment; thyroid nodules  . IBS (irritable bowel syndrome)   . Impaired memory    states from fibromyalgia  . Insomnia    takes Ambien  . Jones fracture    left foot fifth metatarsal  . Multiple allergies    including latex, pet dander, shellfish, pet dander  . Painful orthopaedic hardware right  ankle 05/27/2016  . Seasonal allergies   . Shortness of breath    with exertion;pt states that its related to her weight  . Sore gums    this is why pt is on Amoxil-only takes for dental work  . Tachycardia   . Thyroid goiter   . Varicose vein    protrudes above skin-per pt;vein popped and bruised;ultrasound done to make sure that there were no clots;noclots were found   Past Surgical History:  Procedure Laterality Date  . ANTERIOR LUMBAR FUSION  09/20/2011   Procedure: ANTERIOR LUMBAR FUSION 1 LEVEL;  Surgeon: Winfield Cunas;  Location: [facility];  Service: Neurosurgery;  Laterality: N/A;  Lumbar five-Sacral One Anterior Lumbar Interbody Fusion /Dr. Early to Approach   . CERVICAL DISC SURGERY     WITH TITANIUM PLATE IN NECK---LEFT SIDE  . CERVICAL SPINE SURGERY  03/24/2019  . CESAREAN SECTION    . fibroidectomy    . FRACTURE SURGERY  05/08/2010   Jones fracture left foot fifth metatarsal  . HARDWARE REMOVAL Right 05/28/2016   Procedure: RIGHT ANKLE HARDWARE REMOVAL;  Surgeon: Elsie Saas, MD;  Location: [facility];  Service: Orthopedics;  Laterality: Right;  . IRRIGATION AND DEBRIDEMENT KNEE  04/09/2012   Procedure: IRRIGATION AND DEBRIDEMENT KNEE;  Surgeon: Yvette Rack., MD;  Location: [facility];  Service: Orthopedics;  Laterality: Right;  . JOINT REPLACEMENT  01-27-2012   left total knee and Right total knee  . KNEE SURGERY  2005   Left knee arthroscopy  . NASAL SEPTOPLASTY W/ TURBINOPLASTY  2007   due to recurrent sinusitis  . ORIF ANKLE FRACTURE Right 06/21/2015   Procedure: OPEN REDUCTION INTERNAL FIXATION RIGHT DISTAL FIBULA  FRACTURE AND OPEN REDUCTION INTERNAL FIXATION SYNDESMOSIS ;  Surgeon: Elsie Saas, MD;  Location: [facility];  Service: Orthopedics;  Laterality: Right;  . SHOULDER ARTHROSCOPY W/ ROTATOR CUFF REPAIR Right   . SHOULDER SURGERY Left   . SPINE SURGERY  2004   Cervical plate, ACDF  . STERIOD INJECTION  01/27/2012   Procedure:  STEROID INJECTION;  Surgeon: Lorn Junes, MD;  Location: [facility];  Service: Orthopedics;  Laterality: Right;  . TEE WITHOUT CARDIOVERSION N/A 03/01/2020   Procedure: TRANSESOPHAGEAL ECHOCARDIOGRAM (TEE);  Surgeon: Jerline Pain, MD;  Location: [facility];  Service: Cardiovascular;  Laterality: N/A;  . THORACIC DISCECTOMY  02/16/2020   Procedure: Thoracic ten-eleven Discectomy;  Surgeon: Ashok Pall, MD;  Location: [facility];  Service: Neurosurgery;;  . TONSILLECTOMY  2007  . TOTAL KNEE ARTHROPLASTY  01/27/2012   Procedure: TOTAL KNEE ARTHROPLASTY;  Surgeon: Lorn Junes, MD;  Bilateral  . TOTAL KNEE ARTHROPLASTY  04/06/2012   Procedure: TOTAL KNEE ARTHROPLASTY;  Surgeon: Lorn Junes, MD;  Location: [facility];  Service: Orthopedics;  Laterality: Right;  . TUBAL LIGATION    . UTERINE FIBROID SURGERY     mid 200s  . WOUND EXPLORATION N/A 02/27/2020   Procedure: WOUND  EXPLORATION;  Surgeon: Ashok Pall, MD;  Location: [facility];  Service: Neurosurgery;  Laterality: N/A;    Allergies  Allergen Reactions  . Monosodium Glutamate Anaphylaxis and Swelling    Eyes swollen shut, facial swelling, tongue swelling.  . Shellfish Allergy Anaphylaxis  . Celebrex [Celecoxib] Itching    Only allergic to generic brand  . Contrast Media [Iodinated Diagnostic Agents] Itching and Nausea Only    "could not walk"  . Diclofenac Itching    Can take the name brand Voltaren  . Other Other (See Comments)    Pet dander, Mildew, Mold  Cannot tolerate generic DICLOFENAC GEL--MUST USE BRAND NAME ([place])   . Latex Itching and Rash    Latex glove with powder    Outpatient Encounter Medications as of 03/24/2020  Medication Sig  . albuterol (VENTOLIN HFA) 108 (90 Base) MCG/ACT inhaler Inhale 2 puffs into the lungs every 6 (six) hours as needed for wheezing or shortness of breath.  . Amino Acids-Protein Hydrolys (FEEDING SUPPLEMENT, PRO-STAT SUGAR FREE 64,) LIQD Take 30 mLs by mouth in the morning and  at bedtime.  Marland Kitchen ascorbic acid (VITAMIN C) 500 MG tablet Take 500 mg by mouth daily.  Marland Kitchen aspirin EC 81 MG tablet Take 81 mg by mouth daily.  Marland Kitchen atenolol (TENORMIN) 50 MG tablet Take 1 tablet (50 mg total) by mouth daily.  . Calcium Carb-Cholecalciferol (CALCIUM 600 + D PO) Take 1 capsule by mouth daily.  Marland Kitchen ceFAZolin (ANCEF) IVPB Inject 2 g into the vein every 8 (eight) hours. Indication:  MSSA bacteremia/ lumbar infection First Dose: Yes Last Day of Therapy:  04/11/2020 Labs - Once weekly:  CBC/D and CMP, Labs - Every other week:  ESR and CRP Method of administration: IV Push Method of administration may be changed at the discretion of home infusion pharmacist based upon assessment of the patient and/or caregiver's ability to self-administer the medication ordered.  . CELEBREX 200 MG capsule Take 1 capsule (200 mg total) by mouth 2 (two) times daily.  . chlorzoxazone (PARAFON) 500 MG tablet Take 1 tablet (500 mg total) by mouth 3 (three) times daily.  . diazepam (VALIUM) 5 MG tablet Take 1 tablet (5 mg total) by mouth 2 (two) times daily.  . diclofenac sodium (VOLTAREN) 1 % GEL Apply 1 application topically 2 (two) times daily as needed (shoulder, arm and leg pain). For right shoulder and arm and legs  . furosemide (LASIX) 20 MG tablet Take 2 tablets (40 mg total) by mouth daily.  Marland Kitchen gabapentin (NEURONTIN) 600 MG tablet Take 2 tablets (1,200 mg total) by mouth 3 (three) times daily.  Marland Kitchen guaiFENesin (MUCINEX) 600 MG 12 hr tablet Take 600 mg by mouth 2 (two) times daily as needed for cough.  . hydrochlorothiazide (HYDRODIURIL) 25 MG tablet Take 1 tablet (25 mg total) by mouth daily. One tablet by mouth in the morning.  . lubiprostone (AMITIZA) 24 MCG capsule Take 1 capsule (24 mcg total) by mouth 2 (two) times daily with a meal.  . methocarbamol (ROBAXIN) 500 MG tablet Take 1 tablet (500 mg total) by mouth 4 (four) times daily.  . Multiple Vitamin (MULTIVITAMIN WITH MINERALS) TABS tablet Take 1 tablet by  mouth daily.  . Nutritional Supplements (NUTRITIONAL SUPPLEMENT PO) Take 120 mLs by mouth daily. Mighty Shake  . omeprazole (PRILOSEC) 20 MG capsule Take 20 mg by mouth 2 (two) times daily before a meal.   . oxyCODONE-acetaminophen (PERCOCET) 10-325 MG tablet Take 1 tablet by mouth every  6 (six) hours as needed for pain.  . rifampin (RIFADIN) 300 MG capsule Take 1 capsule (300 mg total) by mouth 2 (two) times daily.  Marland Kitchen senna (SENOKOT) 8.6 MG TABS tablet Take 1 tablet by mouth 2 (two) times daily.  Marland Kitchen zinc sulfate 220 (50 Zn) MG capsule Take 220 mg by mouth daily.  . [DISCONTINUED] albuterol (PROVENTIL HFA;VENTOLIN HFA) 108 (90 BASE) MCG/ACT inhaler Inhale 2 puffs into the lungs every 6 (six) hours as needed for wheezing or shortness of breath.  . [DISCONTINUED] AMITIZA 24 MCG capsule Take 24 mcg by mouth 2 (two) times daily with a meal.   . [DISCONTINUED] atenolol (TENORMIN) 50 MG tablet Take 50 mg by mouth daily.  . [DISCONTINUED] ceFAZolin (ANCEF) IVPB Inject 2 g into the vein every 8 (eight) hours. Indication:  MSSA bacteremia/ lumbar infection First Dose: Yes Last Day of Therapy:  04/11/2020 Labs - Once weekly:  CBC/D and CMP, Labs - Every other week:  ESR and CRP Method of administration: IV Push Method of administration may be changed at the discretion of home infusion pharmacist based upon assessment of the patient and/or caregiver's ability to self-administer the medication ordered.  . [DISCONTINUED] CELEBREX 200 MG capsule Take 200 mg by mouth 2 (two) times daily.  . [DISCONTINUED] chlorzoxazone (PARAFON) 500 MG tablet Take 500 mg by mouth 3 (three) times daily.   . [DISCONTINUED] furosemide (LASIX) 20 MG tablet Take 40 mg by mouth daily.   . [DISCONTINUED] gabapentin (NEURONTIN) 600 MG tablet Take 1,200 mg by mouth 3 (three) times daily.   . [DISCONTINUED] hydrochlorothiazide (HYDRODIURIL) 25 MG tablet Take 1 tablet (25 mg total) by mouth daily. One tablet by mouth in the morning.  .  [DISCONTINUED] methocarbamol (ROBAXIN) 500 MG tablet Take 500 mg by mouth 4 (four) times daily.   . [DISCONTINUED] potassium chloride (KLOR-CON) 10 MEQ tablet Take 10 mEq by mouth every other day.   . [DISCONTINUED] rifampin (RIFADIN) 300 MG capsule Take 1 capsule (300 mg total) by mouth 2 (two) times daily.  . potassium chloride (KLOR-CON) 10 MEQ tablet Take 1 tablet (10 mEq total) by mouth daily.  . [DISCONTINUED] CALCIUM-VITAMIN D PO Take 1 tablet by mouth 2 (two) times daily.  . [DISCONTINUED] cyclobenzaprine (FLEXERIL) 10 MG tablet Take 10 mg by mouth 3 (three) times daily.  . [DISCONTINUED] hydrOXYzine (ATARAX/VISTARIL) 50 MG tablet Take 50 mg by mouth 2 (two) times daily.  . [DISCONTINUED] OVER THE COUNTER MEDICATION Take 1 tablet by mouth daily. Neuriva Brain Supplement  . [DISCONTINUED] tiZANidine (ZANAFLEX) 4 MG tablet Take 1 tablet (4 mg total) by mouth every 6 (six) hours as needed for muscle spasms.   No facility-administered encounter medications on file as of 03/24/2020.    Review of Systems  GENERAL: No change in appetite, no fatigue, no weight changes, no fever, chills or weakness MOUTH and THROAT: Denies oral discomfort, gingival pain or bleeding RESPIRATORY: no cough, SOB, DOE, wheezing, hemoptysis CARDIAC: No chest pain, edema or palpitations GI: No abdominal pain, diarrhea, constipation, heart burn, nausea or vomiting GU: Denies dysuria, frequency, hematuria, incontinence, or discharge NEUROLOGICAL: Denies dizziness, syncope, numbness, or headache PSYCHIATRIC: Denies feelings of depression or anxiety. No report of hallucinations, insomnia, paranoia, or agitation   Immunization History  Administered Date(s) Administered  . Td 05/08/2010   Pertinent  Health Maintenance Due  Topic Date Due  . COLONOSCOPY  Never done  . PAP SMEAR-Modifier  12/12/2018  . INFLUENZA VACCINE  05/07/2020  . MAMMOGRAM  10/10/2021   Fall Risk  03/16/2020  Falls in the past year? 0   Number falls in past yr: 0  Injury with Fall? 0     Vitals:   03/24/20 0915  BP: 112/62  Pulse: 68  Resp: 18  Temp: 98.3 F (36.8 C)  TempSrc: Oral  Weight: 202 lb 9.6 oz (91.9 kg)  Height: 5' 4"  (1.626 m)   Body mass index is 34.78 kg/m.  Physical Exam  GENERAL APPEARANCE: Well nourished. In no acute distress. Obese SKIN:  Surgical wound on mid upper and lower back attached to wound vac MOUTH and THROAT: Lips are without lesions. Oral mucosa is moist and without lesions. Tongue is normal in shape, size, and color and without lesions RESPIRATORY: Breathing is even & unlabored, BS CTAB CARDIAC: RRR, no murmur,no extra heart sounds, no edema GI: Abdomen soft, normal BS, no masses, no tenderness EXTREMITIES: Able to move x4 extremities NEUROLOGICAL: There is no tremor. Speech is clear. Alert and oriented X 3.  PSYCHIATRIC:  Affect and behavior are appropriate  Labs reviewed: Recent Labs    02/14/20 1612 02/16/20 1125 02/28/20 0139 02/28/20 0139 03/02/20 0516 03/02/20 0516 03/05/20 0602 03/05/20 0602 03/09/20 0000 03/15/20 0000 03/22/20 0000  NA 139   < > 140   < > 138   < > 138  --  134* 138 138  K 2.4*   < > 4.5   < > 3.2*   < > 3.9   < > 4.1 3.3* 2.9*  CL 100   < > 106   < > 97*   < > 101   < > 93* 94* 95*  CO2 22   < > 23   < > 26   < > 26   < > 19 25* 27*  GLUCOSE 83   < > 166*  --  88  --  102*  --   --   --   --   BUN 13   < > 12   < > 10   < > 9  --  11 17 16   CREATININE 0.93   < > 0.69   < > 0.62   < > 0.56  --  0.4* 0.5 0.4*  CALCIUM 9.8   < > 8.3*   < > 8.0*   < > 8.3*   < > 10.0 10.4 10.2  MG 2.3  --   --   --   --   --   --   --   --   --   --    < > = values in this interval not displayed.   Recent Labs    02/26/20 2122 02/26/20 2122 03/02/20 0516 03/15/20 0000 03/22/20 0000  AST 41   < > 9* 22 22  ALT 30   < > 16 8 5*  ALKPHOS 106   < > 89 158* 192*  BILITOT 0.7  --  1.0  --   --   PROT 5.1*  --  4.8*  --   --   ALBUMIN 2.1*   < > 1.6*  3.9 3.7   < > = values in this interval not displayed.   Recent Labs    02/26/20 2122 02/26/20 2122 02/28/20 0139 02/28/20 0139 03/02/20 0516 03/02/20 0516 03/09/20 0000 03/15/20 0000 03/22/20 0000  WBC 24.8*   < > 19.1*   < > 15.6*  --  17.8 13.4 13.4  NEUTROABS 22.0*   < >  --   --  9.5*  --  12 8 9   HGB 8.5*   < > 8.1*   < > 7.4*   < > 9.1* 9.1* 9.5*  HCT 28.3*   < > 26.4*   < > 24.4*   < > 29* 30* 31*  MCV 77.3*  --  75.4*  --  76.3*  --   --   --   --   PLT 341   < > 321   < > 378   < > 849* 590* 276   < > = values in this interval not displayed.    Lab Results  Component Value Date   HGBA1C 6.0 (H) 02/29/2020   Lab Results  Component Value Date   CHOL 196 12/30/2013   HDL 70 12/30/2013   LDLCALC 106 (H) 12/30/2013   TRIG 102 12/30/2013   CHOLHDL 2.8 12/30/2013    Significant Diagnostic Results in last 30 days:  ECHOCARDIOGRAM COMPLETE  Result Date: 02/29/2020    ECHOCARDIOGRAM REPORT   Patient Name:   [name] Date of Exam: 02/29/2020 Medical Rec #:  [id number]       Height:       64.0 in Accession #:    [id number]      Weight:       191.8 lb Date of Birth:  August 16, 1961      BSA:          1.922 m Patient Age:    12 years        BP:           125/71 mmHg Patient Gender: F               HR:           89 bpm. Exam Location:  Inpatient Procedure: 2D Echo, Cardiac Doppler and Color Doppler Indications:    Bacteremia  History:        Patient has no prior history of Echocardiogram examinations.                 Risk Factors:Hypertension and Former Smoker. GERD.  Sonographer:    Clayton Lefort RDCS (AE) Referring Phys: [place]  1. Left ventricular ejection fraction, by estimation, is 55 to 60%. The left ventricle has normal function. The left ventricle has no regional wall motion abnormalities. There is mild left ventricular hypertrophy. Left ventricular diastolic parameters are consistent with Grade I diastolic dysfunction (impaired relaxation).  2.  Right ventricular systolic function is normal. The right ventricular size is normal. Tricuspid regurgitation signal is inadequate for assessing PA pressure.  3. Left atrial size was mildly dilated.  4. The mitral valve is normal in structure. No evidence of mitral valve regurgitation. No evidence of mitral stenosis.  5. The aortic valve is tricuspid. Aortic valve regurgitation is not visualized. Mild aortic valve sclerosis is present, with no evidence of aortic valve stenosis.  6. The inferior vena cava is normal in size with greater than 50% respiratory variability, suggesting right atrial pressure of 3 mmHg. FINDINGS  Left Ventricle: Left ventricular ejection fraction, by estimation, is 55 to 60%. The left ventricle has normal function. The left ventricle has no regional wall motion abnormalities. The left ventricular internal cavity size was normal in size. There is  mild left ventricular hypertrophy. Left ventricular diastolic parameters are consistent with Grade I diastolic dysfunction (impaired relaxation). Right Ventricle: The right ventricular size is normal. No increase in right ventricular wall thickness. Right ventricular systolic  function is normal. Tricuspid regurgitation signal is inadequate for assessing PA pressure. Left Atrium: Left atrial size was mildly dilated. Right Atrium: Right atrial size was normal in size. Pericardium: There is no evidence of pericardial effusion. Mitral Valve: The mitral valve is normal in structure. There is mild calcification of the mitral valve leaflet(s). Mild mitral annular calcification. No evidence of mitral valve regurgitation. No evidence of mitral valve stenosis. MV peak gradient, 7.4 mmHg. The mean mitral valve gradient is 4.0 mmHg. Tricuspid Valve: The tricuspid valve is normal in structure. Tricuspid valve regurgitation is trivial. Aortic Valve: The aortic valve is tricuspid. Aortic valve regurgitation is not visualized. Mild aortic valve sclerosis is present,  with no evidence of aortic valve stenosis. Aortic valve mean gradient measures 6.0 mmHg. Aortic valve peak gradient measures 13.2 mmHg. Aortic valve area, by VTI measures 1.64 cm. Pulmonic Valve: The pulmonic valve was normal in structure. Pulmonic valve regurgitation is trivial. Aorta: The aortic root is normal in size and structure. Venous: The inferior vena cava is normal in size with greater than 50% respiratory variability, suggesting right atrial pressure of 3 mmHg. IAS/Shunts: No atrial level shunt detected by color flow Doppler.  LEFT VENTRICLE PLAX 2D LVIDd:         4.59 cm  Diastology LVIDs:         2.89 cm  LV e' lateral:   9.57 cm/s LV PW:         1.27 cm  LV E/e' lateral: 10.8 LV IVS:        1.30 cm  LV e' medial:    8.59 cm/s LVOT diam:     1.70 cm  LV E/e' medial:  12.0 LV SV:         50 LV SV Index:   26 LVOT Area:     2.27 cm  RIGHT VENTRICLE             IVC RV Basal diam:  2.59 cm     IVC diam: 1.61 cm RV S prime:     10.30 cm/s TAPSE (M-mode): 2.4 cm LEFT ATRIUM             Index       RIGHT ATRIUM           Index LA diam:        3.20 cm 1.67 cm/m  RA Area:     13.60 cm LA Vol (A2C):   29.6 ml 15.40 ml/m RA Volume:   36.00 ml  18.73 ml/m LA Vol (A4C):   60.4 ml 31.43 ml/m LA Biplane Vol: 42.9 ml 22.32 ml/m  AORTIC VALVE AV Area (Vmax):    1.67 cm AV Area (Vmean):   1.58 cm AV Area (VTI):     1.64 cm AV Vmax:           182.00 cm/s AV Vmean:          116.000 cm/s AV VTI:            0.304 m AV Peak Grad:      13.2 mmHg AV Mean Grad:      6.0 mmHg LVOT Vmax:         134.00 cm/s LVOT Vmean:        80.700 cm/s LVOT VTI:          0.219 m LVOT/AV VTI ratio: 0.72  AORTA Ao Root diam: 2.70 cm Ao Asc diam:  2.60 cm MITRAL VALVE MV Area (PHT): 4.17 cm  SHUNTS MV Peak grad:  7.4 mmHg     Systemic VTI:  0.22 m MV Mean grad:  4.0 mmHg     Systemic Diam: 1.70 cm MV Vmax:       1.36 m/s MV Vmean:      86.8 cm/s MV Decel Time: 182 msec MV E velocity: 103.00 cm/s MV A velocity: 102.00 cm/s MV E/A  ratio:  1.01 Loralie Champagne MD Electronically signed by Loralie Champagne MD Signature Date/Time: 02/29/2020/2:56:17 PM    Final    ECHO TEE  Result Date: 03/01/2020    TRANSESOPHOGEAL ECHO REPORT   Patient Name:   LAMYIAH CRAWSHAW Date of Exam: 03/01/2020 Medical Rec #:  [id number]       Height:       64.0 in Accession #:    [id number]      Weight:       191.8 lb Date of Birth:  15-Oct-1960      BSA:          1.922 m Patient Age:    74 years        BP:           110/52 mmHg Patient Gender: F               HR:           59 bpm. Exam Location:  Inpatient Procedure: 2D Echo Indications:     Bacteremia 790.7 / R78.81  History:         Patient has prior history of Echocardiogram examinations, most                  recent 02/29/2020. Risk Factors:Hypertension. MSSA bacteremia.  Sonographer:     Jaquita Folds Referring Phys:  [phone] JILL D MCDANIEL Diagnosing Phys: Candee Furbish MD PROCEDURE: The transesophogeal probe was passed without difficulty through the esophogus of the patient. Sedation performed by different physician. The patient was monitored while under deep sedation. Anesthestetic sedation was provided intravenously by Anesthesiology: 337m of Propofol. The patient's vital signs; including heart rate, blood pressure, and oxygen saturation; remained stable throughout the procedure. The patient developed no complications during the procedure. IMPRESSIONS  1. Left ventricular ejection fraction, by estimation, is 60 to 65%. The left ventricle has normal function. The left ventricle has no regional wall motion abnormalities.  2. Right ventricular systolic function is normal. The right ventricular size is normal.  3. No left atrial/left atrial appendage thrombus was detected.  4. The mitral valve is normal in structure. Mild mitral valve regurgitation. No evidence of mitral stenosis.  5. The aortic valve is normal in structure. Aortic valve regurgitation is not visualized. No aortic stenosis is present.  6. The inferior  vena cava is normal in size with greater than 50% respiratory variability, suggesting right atrial pressure of 3 mmHg. Conclusion(s)/Recommendation(s): No evidence of vegetation/infective endocarditis on this transesophageal echocardiogram. FINDINGS  Left Ventricle: Left ventricular ejection fraction, by estimation, is 60 to 65%. The left ventricle has normal function. The left ventricle has no regional wall motion abnormalities. The left ventricular internal cavity size was normal in size. There is  no left ventricular hypertrophy. Right Ventricle: The right ventricular size is normal. No increase in right ventricular wall thickness. Right ventricular systolic function is normal. Left Atrium: Left atrial size was normal in size. No left atrial/left atrial appendage thrombus was detected. Right Atrium: Right atrial size was normal in size. Prominent Chiari network. Pericardium: There is no evidence of pericardial  effusion. Mitral Valve: The mitral valve is normal in structure. Normal mobility of the mitral valve leaflets. Mild mitral valve regurgitation. No evidence of mitral valve stenosis. Tricuspid Valve: The tricuspid valve is normal in structure. Tricuspid valve regurgitation is trivial. No evidence of tricuspid stenosis. Aortic Valve: The aortic valve is normal in structure. Aortic valve regurgitation is not visualized. No aortic stenosis is present. Pulmonic Valve: The pulmonic valve was normal in structure. Pulmonic valve regurgitation is not visualized. No evidence of pulmonic stenosis. Aorta: The aortic root is normal in size and structure. Venous: The inferior vena cava is normal in size with greater than 50% respiratory variability, suggesting right atrial pressure of 3 mmHg. IAS/Shunts: No atrial level shunt detected by color flow Doppler. Candee Furbish MD Electronically signed by Candee Furbish MD Signature Date/Time: 03/01/2020/4:29:19 PM    Final    [place] EKG SITE RITE  Result Date: 03/02/2020 If Site  Rite image not attached, placement could not be confirmed due to current cardiac rhythm.   Assessment/Plan  1. MSSA bacteremia - follow up with Infectious Disease - ceFAZolin (ANCEF) IVPB; Inject 2 g into the vein every 8 (eight) hours. Indication:  MSSA bacteremia/ lumbar infection First Dose: Yes Last Day of Therapy:  04/11/2020 Labs - Once weekly:  CBC/D and CMP, Labs - Every other week:  ESR and CRP Method of administration: IV Push Method of administration may be changed at the discretion of home infusion pharmacist based upon assessment of the patient and/or caregiver's ability to self-administer the medication ordered.  Dispense: 180 Units; Refill: 0  2. Infection of deep incisional surgical site after procedure, subsequent encounter - S/P wound exploration/wound debridement on 02/26/2020 -Follow-up with Dr. Ashok Pall, neurosurgery, on 04/11/2020 - ceFAZolin (ANCEF) IVPB; Inject 2 g into the vein every 8 (eight) hours. Indication:  MSSA bacteremia/ lumbar infection First Dose: Yes Last Day of Therapy:  04/11/2020 Labs - Once weekly:  CBC/D and CMP, Labs - Every other week:  ESR and CRP Method of administration: IV Push Method of administration may be changed at the discretion of home infusion pharmacist based upon assessment of the patient and/or caregiver's ability to self-administer the medication ordered.  Dispense: 180 Units; Refill: 0 - rifampin (RIFADIN) 300 MG capsule; Take 1 capsule (300 mg total) by mouth 2 (two) times daily.  Dispense: 60 capsule; Refill: 0  3. Essential hypertension - atenolol (TENORMIN) 50 MG tablet; Take 1 tablet (50 mg total) by mouth daily.  Dispense: 30 tablet; Refill: 0 - hydrochlorothiazide (HYDRODIURIL) 25 MG tablet; Take 1 tablet (25 mg total) by mouth daily. One tablet by mouth in the morning.  Dispense: 30 tablet; Refill: 0  4. Hypokalemia - 03/23/20  K 2.9 - 03/24/20  K 3.1 - supplemented - continue KCL ER 10 meq 1 tab BID - BMP in 1 week   5. Chronic constipation - lubiprostone (AMITIZA) 24 MCG capsule; Take 1 capsule (24 mcg total) by mouth 2 (two) times daily with a meal.  Dispense: 60 capsule; Refill: 0  6. Anxiety - continue Valium 5 mg BID - she said she still has medications at home  7. Leg swelling - furosemide (LASIX) 20 MG tablet; Take 2 tablets (40 mg total) by mouth daily.  Dispense: 60 tablet; Refill: 0  8. Neuropathy - CELEBREX 200 MG capsule; Take 1 capsule (200 mg total) by mouth 2 (two) times daily.  Dispense: 60 capsule; Refill: 0 - chlorzoxazone (PARAFON) 500 MG tablet; Take 1 tablet (500 mg total)  by mouth 3 (three) times daily.  Dispense: 90 tablet; Refill: 0 - gabapentin (NEURONTIN) 600 MG tablet; Take 2 tablets (1,200 mg total) by mouth 3 (three) times daily.  Dispense: 180 tablet; Refill: 0 - methocarbamol (ROBAXIN) 500 MG tablet; Take 1 tablet (500 mg total) by mouth 4 (four) times daily.  Dispense: 120 tablet; Refill: 0 - she said that she still has Percocet 10-325 mg at home    I have filled out patient's discharge paperwork and e-prescribed medications.  Patient will receive home health PT, OT and Nurse.  She will follow up with her PCP, Dr. York Ram.  Treatment: Cleanse mid back incision with NS, apply black foam, drape and and wound VAC at 125 mmHg.  Changed MWF.  DME provided:  None  Total discharge time: Greater than 30 minutes Greater than 50% was spent in counseling and coordination of care.   Discharge time involved coordination of the discharge process with social worker, nursing staff and therapy department. Medical justification for home health services verified.    Durenda Age, DNP, FNP-BC [facility] [phone] (Monday-Friday 8:00 a.m. - 5:00 p.m.) [phone] (after hours)

## 2020-03-28 ENCOUNTER — Telehealth: Payer: Self-pay

## 2020-03-28 NOTE — Telephone Encounter (Signed)
Received call from [facility] nurse, Sharyl Nimrod with critical lab value. RN report [facility] glucose of 28.  LPN called called patient to see how she was feeling. Patient states she is doing well. She denies any indications or  signs of hypoglycemia such as fatigue/irribility/hunger/shakiness/sweating.  Labs placed in pharmacy box for review. Also routing to provider to make aware. [name]

## 2020-04-06 ENCOUNTER — Telehealth: Payer: Medicare Other | Admitting: Internal Medicine

## 2020-04-11 ENCOUNTER — Telehealth: Payer: Self-pay

## 2020-04-11 NOTE — Telephone Encounter (Signed)
Marisue Ivan with [facility] is calling regarding last date of IV antibiotics.  She states the patient seems to have extra medication and she wanted to verify stop date.   As of 03-07-2020 office notes patient is to discontinue IV antibiotics on April 11, 2020 and PICC can be removed upon completion.   She will need a set of labs on 04-11-2020.  Laurell Josephs, RN

## 2020-04-19 ENCOUNTER — Telehealth (INDEPENDENT_AMBULATORY_CARE_PROVIDER_SITE_OTHER): Payer: Medicare Other | Admitting: Internal Medicine

## 2020-04-19 ENCOUNTER — Other Ambulatory Visit: Payer: Self-pay

## 2020-04-19 ENCOUNTER — Other Ambulatory Visit: Payer: Self-pay | Admitting: Internal Medicine

## 2020-04-19 DIAGNOSIS — Z792 Long term (current) use of antibiotics: Secondary | ICD-10-CM

## 2020-04-19 DIAGNOSIS — T8142XA Infection following a procedure, deep incisional surgical site, initial encounter: Secondary | ICD-10-CM

## 2020-04-19 DIAGNOSIS — R7881 Bacteremia: Secondary | ICD-10-CM

## 2020-04-19 DIAGNOSIS — B9561 Methicillin susceptible Staphylococcus aureus infection as the cause of diseases classified elsewhere: Secondary | ICD-10-CM

## 2020-04-19 DIAGNOSIS — T8142XD Infection following a procedure, deep incisional surgical site, subsequent encounter: Secondary | ICD-10-CM

## 2020-04-19 MED ORDER — CEPHALEXIN 500 MG PO CAPS: 500.0000 mg | ORAL_CAPSULE | Freq: Four times a day (QID) | ORAL | 1 refills | Status: DC

## 2020-04-19 MED ORDER — RIFAMPIN 300 MG PO CAPS: 300.0000 mg | ORAL_CAPSULE | Freq: Two times a day (BID) | ORAL | 0 refills | Status: DC

## 2020-04-19 NOTE — Progress Notes (Signed)
Virtual Visit via Telephone Note  I connected with [name] on 04/19/20 at 10:15 AM EDT by a telephone enabled telemedicine application and verified that I am speaking with the correct person using two identifiers. Converted to telephone since video connection not working  Location: Patient: at home  Provider: in clinic   I discussed the limitations of evaluation and management by telemedicine and the availability of in person appointments. The patient expressed understanding and agreed to proceed.  History of Present Illness: 59yo F with history of lumbar fusion complicated by early MSSA infection of HW/SSI, she underwent debridement plus rule out of endocarditis. She was placed on 6 wk of cefazolin plus rifampin to end on 7/6 then transitioned to oral abtx.  Still needing wound vac, nurses come 2 incision -- lower incision is 7cm, 0.2 cm depth, 0.3   Upper incision still requiring wound vac and measurement from today's wound care RN says it is  2 cm length 1 cm width, 2.7 cm depth. (down from 5 cm, 1.5 cm. 3 cm)Serous drainage. Showing steady improvement. Estimated will need 4 more weeks.    Observations/Objective: No   Assessment and Plan:  Will send in rx- for cephalexin since not started yet, she has been on monotherapy for a week. Unclear where discrepancy occurred. Her picc line has been removed. Will get her on dual therapy of keflex 500mg  QID plus rifampin 300mg  BID  Gave verbal order- cbc, bmp, sed rate, crp,  via with [facility]  Continue wound vac  Will see back in 4 wk   Follow Up Instructions:    I discussed the assessment and treatment plan with the patient. The patient was provided an opportunity to ask questions and all were answered. The patient agreed with the plan and demonstrated an understanding of the instructions.   The patient was advised to call back or seek an in-person evaluation if the symptoms worsen or if the condition fails to  improve as anticipated.  I provided 20 minutes of non-face-to-face time during this encounter.   , MD

## 2020-04-20 ENCOUNTER — Telehealth: Payer: Self-pay

## 2020-04-20 NOTE — Telephone Encounter (Addendum)
Patient reports since starting Keflex last night she is experiencing sweating spells and thin cold chills. Patient states she is taking the medication with a small snack and a full cup of water.  Denies any nausea/vominting/diarreha. Routing to provider to make MD aware. Valarie Cones

## 2020-04-25 NOTE — Telephone Encounter (Signed)
Can you call back to see if still having cold chills. If so, can we see if she can come into clinic to be evaluated?

## 2020-05-01 NOTE — Telephone Encounter (Signed)
Patient states everything has now resolved. She is fine continuing the medication.

## 2020-05-16 ENCOUNTER — Telehealth: Payer: Self-pay

## 2020-05-16 ENCOUNTER — Other Ambulatory Visit: Payer: Self-pay | Admitting: Internal Medicine

## 2020-05-16 DIAGNOSIS — T8142XD Infection following a procedure, deep incisional surgical site, subsequent encounter: Secondary | ICD-10-CM

## 2020-05-16 NOTE — Telephone Encounter (Signed)
Received refill request for Keflex for MSSA to surgical lumbar site. Per last phone visit patient was to follow up with provider in 4 weeks. No appointment noted. Routing to provider if patient needs to continue oral therapy until next visit.  Valarie Cones

## 2020-05-18 ENCOUNTER — Other Ambulatory Visit: Payer: Self-pay

## 2020-05-18 DIAGNOSIS — T8142XD Infection following a procedure, deep incisional surgical site, subsequent encounter: Secondary | ICD-10-CM

## 2020-05-18 MED ORDER — RIFAMPIN 300 MG PO CAPS: 300.0000 mg | ORAL_CAPSULE | Freq: Two times a day (BID) | ORAL | 0 refills | Status: AC

## 2020-05-18 NOTE — Telephone Encounter (Signed)
Yes dual therapy. Please have her come to get CMP and sed rate and crp

## 2020-05-22 NOTE — Telephone Encounter (Signed)
Patient made are and appointments (lab/office visit) scheduled. Patient accepted appointments.

## 2020-06-02 ENCOUNTER — Telehealth: Payer: Self-pay | Admitting: *Deleted

## 2020-06-02 NOTE — Telephone Encounter (Signed)
[name], patient's home health nurse, calling with update.  She is on oral antibiotics for wound infection after back surgery.  The upper part of her incision started to tunnel in early August, was 4.5 cm deep 8/4.  Wound vac was applied, but slow improvement, still 3.7 cm deep. The opening is closing however, now 0.3 cm round. She has been taking keflex and rifampin. Patient last saw [facility] 8/24. Wound vac not removed at surgeon, xray taken (no osteo per xray).  [name] is asking for advice. RN advised nurse to see if surgery can evaluate wound, as Dr Feliz Beam next opening is 06/20/20. [name], [facility] [phone] Please advise. Andree Coss, RN

## 2020-06-15 ENCOUNTER — Telehealth: Payer: Self-pay

## 2020-06-15 NOTE — Telephone Encounter (Signed)
Received call from patient requesting HH to drawn blood work and send to Dr. Drue Second. Previous lab orders draw sed rate, CMP, and CRP. Verbal order given to [facility] nurse.

## 2020-06-15 NOTE — Telephone Encounter (Signed)
Received call today from home health regarding orders for labs. Verbal orders given for CRP, CMP, and Sed Rate. Home health will collect labs on 9/15. Lorenso Courier, [place]

## 2020-06-20 ENCOUNTER — Other Ambulatory Visit: Payer: Medicare Other

## 2020-06-21 ENCOUNTER — Encounter: Payer: Self-pay | Admitting: Internal Medicine

## 2020-06-26 ENCOUNTER — Telehealth: Payer: Self-pay

## 2020-06-26 NOTE — Telephone Encounter (Signed)
Patient called to ask that we send [facility] lab report to her provider Dr Lupe Carney . She has a scheduled appointment this week and he will need the labs.    Labs obtained, faxed to provider as requested by patient.   Laurell Josephs, RN

## 2020-06-28 ENCOUNTER — Other Ambulatory Visit: Payer: Self-pay | Admitting: Neurosurgery

## 2020-06-28 DIAGNOSIS — M48062 Spinal stenosis, lumbar region with neurogenic claudication: Secondary | ICD-10-CM

## 2020-07-05 ENCOUNTER — Other Ambulatory Visit: Payer: Self-pay

## 2020-07-05 ENCOUNTER — Telehealth (INDEPENDENT_AMBULATORY_CARE_PROVIDER_SITE_OTHER): Payer: Medicare Other | Admitting: Internal Medicine

## 2020-07-05 DIAGNOSIS — T8142XD Infection following a procedure, deep incisional surgical site, subsequent encounter: Secondary | ICD-10-CM

## 2020-07-05 DIAGNOSIS — B9561 Methicillin susceptible Staphylococcus aureus infection as the cause of diseases classified elsewhere: Secondary | ICD-10-CM | POA: Diagnosis not present

## 2020-07-05 DIAGNOSIS — R7881 Bacteremia: Secondary | ICD-10-CM | POA: Diagnosis not present

## 2020-07-05 NOTE — Progress Notes (Signed)
Virtual Visit via Telephone Note  I connected with [name] on 07/05/20 at 11:30 AM EDT by telephone and verified that I am speaking with the correct person using two identifiers.  Location: Patient: at home Provider: at clinic   I discussed the limitations, risks, security and privacy concerns of performing an evaluation and management service by telephone and the availability of in person appointments. I also discussed with the patient that there may be a patient responsible charge related to this service. The patient expressed understanding and agreed to proceed.   History of Present Illness: Continues to take abtx (rif twice a day and cephalexin 4x a day) Has a CT scan coming up this week for evaluation to see if HW migrated since having still considerable pain No fever, chills Wound vac is still in place Observations/Objective:  afebrile Assessment and Plan: Continue with wound -- down to 2.2cm Continue oral abtx Follow ct scan  see in person 5-6wk   Follow Up Instructions:    I discussed the assessment and treatment plan with the patient. The patient was provided an opportunity to ask questions and all were answered. The patient agreed with the plan and demonstrated an understanding of the instructions.   The patient was advised to call back or seek an in-person evaluation if the symptoms worsen or if the condition fails to improve as anticipated.  I provided 10 minutes of non-face-to-face time during this encounter.   Judyann Munson, MD

## 2020-07-06 ENCOUNTER — Other Ambulatory Visit: Payer: Self-pay | Admitting: Internal Medicine

## 2020-07-07 ENCOUNTER — Ambulatory Visit
Admission: RE | Admit: 2020-07-07 | Discharge: 2020-07-07 | Disposition: A | Discharge status: Home / Self Care | Payer: Medicare Other | Source: Ambulatory Visit | Attending: Neurosurgery | Admitting: Neurosurgery

## 2020-07-07 DIAGNOSIS — M48062 Spinal stenosis, lumbar region with neurogenic claudication: Secondary | ICD-10-CM

## 2020-07-14 ENCOUNTER — Other Ambulatory Visit: Payer: Self-pay | Admitting: Neurosurgery

## 2020-07-19 NOTE — Progress Notes (Signed)
Patient will need COVID testing DOS. Cyndia Diver, RN made aware.

## 2020-07-25 ENCOUNTER — Other Ambulatory Visit: Payer: Self-pay

## 2020-07-25 ENCOUNTER — Encounter (HOSPITAL_COMMUNITY): Payer: Self-pay | Admitting: Neurosurgery

## 2020-07-25 NOTE — Progress Notes (Signed)
Patient denies shortness of breath, fever, cough or chest pain.  PCP - Dr Rosita Kea; Labs - Dr L. Lupe Carney (pt sees Q6 mos for labs) Cardiologist - n/a  Chest x-ray - n/a EKG - 02/28/20 Stress Test - n/a ECHO - 03/01/20 Cardiac Cath - n/a  Anesthesia review: Yes  STOP now taking any Aspirin (unless otherwise instructed by your surgeon), Aleve, Naproxen, Ibuprofen, Motrin, Advil, Goody's, BC's, all herbal medications, fish oil, and all vitamins.   Coronavirus Screening Covid test on DOS Do you have any of the following symptoms:  Cough yes/no: No Fever (>100.57F)  yes/no: No Runny nose yes/no: No Sore throat yes/no: No Difficulty breathing/shortness of breath  yes/no: No  Have you traveled in the last 14 days and where? yes/no: No  Patient verbalized understanding of instructions that were given via phone.

## 2020-07-27 ENCOUNTER — Encounter (HOSPITAL_COMMUNITY): Payer: Self-pay | Admitting: Neurosurgery

## 2020-07-27 ENCOUNTER — Other Ambulatory Visit: Payer: Self-pay

## 2020-07-27 NOTE — Progress Notes (Signed)
---  SDW---   Your procedure is scheduled on Friday, October 22nd.  Report to [facility] Main Entrance "A" at 10:00 A.M., and check in at the Admitting office.  Call this number if you have problems the morning of surgery:  [phone]  Call [phone] if you have any questions prior to your surgery date Monday-Friday 8am-4pm   Remember:  Do not eat or drink after midnight the night before your surgery    Take these medicines the morning of surgery with A SIP OF WATER atenolol (TENORMIN) diazepam (VALIUM)  gabapentin (NEURONTIN)  hydrOXYzine (ATARAX/VISTARIL)  methocarbamol (ROBAXIN)  omeprazole (PRILOSEC) oxyCODONE-acetaminophen (PERCOCET)   If needed: acetaminophen (TYLENOL),chlorzoxazone (PARAFON),senna (SENOKOT) cyclobenzaprine (FLEXERIL),  diphenhydrAMINE (BENADRYL)  albuterol (VENTOLIN HFA)/inhaler -bring with you the day of surgery   As of today, STOP taking CELEBREX, diclofenac (FLECTOR), any Aspirin (unless otherwise instructed by your surgeon) Aleve, Naproxen, Ibuprofen, Motrin, Advil, Goody's, BC's, all herbal medications, fish oil, and all vitamins.                     Do not wear jewelry, make up, or nail polish            Do not wear lotions, powders, perfumes, or deodorant.            Do not shave 48 hours prior to surgery.             Do not bring valuables to the hospital.            [facility] is not responsible for any belongings or valuables.  Do NOT Smoke (Tobacco/Vaping) or drink Alcohol 24 hours prior to your procedure If you use a CPAP at night, you may bring all equipment for your overnight stay.   Contacts, glasses, dentures or bridgework may not be worn into surgery.      For patients admitted to the hospital, discharge time will be determined by your treatment team.   Patients discharged the day of surgery will not be allowed to drive home, and someone needs to stay with them for 24 hours.   Please read over the following fact sheets that you  were given.

## 2020-07-27 NOTE — Progress Notes (Signed)
PCP - Dr. Mayford Knife, Dr. Lupe Carney  Cardiologist - pt denies  Chest x-ray - n/a EKG - 02/26/20 Stress Test - pt denies ECHO - 02/29/20 Cardiac Cath - pt denies   Blood Thinner Instructions: n/a Aspirin Instructions: Follow your surgeon's instructions on when to stop Aspirin.  If no instructions were given by your surgeon then you will need to call the office to get those instructions.   Per pt, last dose ASA 07/18/20   COVID TEST- DOS    Anesthesia review: n/a   -------------  SDW INSTRUCTIONS:  Your procedure is scheduled on 07/28/20 Friday .  Report to [facility] Main Entrance "A" at 0930 A.M., and check in at the Admitting office.  Pt will need COVID TESTING DOS.   Call this number if you have problems the morning of surgery: [phone]   Remember: Do not eat or drink after midnight the night before your surgery   Take these medicines the morning of surgery with A SIP OF WATER: atenolol (TENORMIN) diazepam (VALIUM)  gabapentin (NEURONTIN)  hydrOXYzine (ATARAX/VISTARIL)  methocarbamol (ROBAXIN)  omeprazole (PRILOSEC) oxyCODONE-acetaminophen (PERCOCET)   If needed: acetaminophen (TYLENOL),chlorzoxazone (PARAFON),senna (SENOKOT) cyclobenzaprine (FLEXERIL),  diphenhydrAMINE (BENADRYL)   albuterol (VENTOLIN HFA)/inhaler -bring with you the day of surgery  As of today, STOP taking any Aspirin (unless otherwise instructed by your surgeon), Aleve, Naproxen, Ibuprofen, Motrin, Advil, Goody's, BC's, all herbal medications, fish oil, and all vitamins.    The Morning of Surgery  Do not wear jewelry, make-up or nail polish.  Do not wear lotions, powders, or perfumes, or deodorant  Do not shave 48 hours prior to surgery.    Do not bring valuables to the hospital.  [facility] is not responsible for any belongings or valuables.  If you are a smoker, DO NOT Smoke 24 hours prior to surgery  If you wear a CPAP at night please bring your mask the morning of surgery    Remember that you must have someone to transport you home after your surgery, and remain with you for 24 hours if you are discharged the same day.   Please bring cases for contacts, glasses, hearing aids, dentures or bridgework because it cannot be worn into surgery.    Leave your suitcase in the car.  After surgery it may be brought to your room.  For patients admitted to the hospital, discharge time will be determined by your treatment team.  Patients discharged the day of surgery will not be allowed to drive home.    Special instructions:   [place]- Preparing For Surgery  Oral Hygiene is also important to reduce your risk of infection.  Remember - BRUSH YOUR TEETH THE MORNING OF SURGERY WITH YOUR REGULAR TOOTHPASTE  Please follow these instructions carefully.   1. Shower the NIGHT BEFORE SURGERY and the MORNING OF SURGERY with DIAL Soap.   2. Wash thoroughly, paying special attention to the area where your surgery will be performed.  3. Thoroughly rinse your body with warm water from the neck down.  4. Pat yourself dry with a CLEAN TOWEL.  5. Wear CLEAN PAJAMAS to bed the night before surgery  6. Place CLEAN SHEETS on your bed the night of your first shower and DO NOT SLEEP WITH PETS.  7. Wear comfortable clothes the morning of surgery.    Day of Surgery:  Please shower the morning of surgery with the DIAL soap Do not apply any deodorants/lotions. Please wear clean clothes to the hospital/surgery center.  Remember to brush your teeth WITH YOUR REGULAR TOOTHPASTE.   Please read over the following fact sheets that you were given.  Patient denies shortness of breath, fever, cough and chest pain.

## 2020-07-28 ENCOUNTER — Ambulatory Visit (HOSPITAL_COMMUNITY): Payer: Medicare Other | Admitting: Physician Assistant

## 2020-07-28 ENCOUNTER — Encounter (HOSPITAL_COMMUNITY): Payer: Self-pay | Admitting: Neurosurgery

## 2020-07-28 ENCOUNTER — Observation Stay (HOSPITAL_COMMUNITY)
Admission: RE | Admit: 2020-07-28 | Discharge: 2020-07-29 | Disposition: A | Discharge status: Home / Self Care | Payer: Medicare Other | Attending: Neurosurgery | Admitting: Neurosurgery

## 2020-07-28 ENCOUNTER — Encounter (HOSPITAL_COMMUNITY)
Admission: RE | Disposition: A | Discharge status: Home / Self Care | Payer: Self-pay | Source: Home / Self Care | Attending: Neurosurgery

## 2020-07-28 ENCOUNTER — Ambulatory Visit (HOSPITAL_COMMUNITY): Payer: Medicare Other

## 2020-07-28 ENCOUNTER — Other Ambulatory Visit: Payer: Self-pay

## 2020-07-28 DIAGNOSIS — Z91040 Latex allergy status: Secondary | ICD-10-CM | POA: Insufficient documentation

## 2020-07-28 DIAGNOSIS — Z20822 Contact with and (suspected) exposure to covid-19: Secondary | ICD-10-CM | POA: Insufficient documentation

## 2020-07-28 DIAGNOSIS — Z7982 Long term (current) use of aspirin: Secondary | ICD-10-CM | POA: Diagnosis not present

## 2020-07-28 DIAGNOSIS — Z79899 Other long term (current) drug therapy: Secondary | ICD-10-CM | POA: Diagnosis not present

## 2020-07-28 DIAGNOSIS — I10 Essential (primary) hypertension: Secondary | ICD-10-CM | POA: Diagnosis not present

## 2020-07-28 DIAGNOSIS — J45909 Unspecified asthma, uncomplicated: Secondary | ICD-10-CM | POA: Insufficient documentation

## 2020-07-28 DIAGNOSIS — Z87891 Personal history of nicotine dependence: Secondary | ICD-10-CM | POA: Diagnosis not present

## 2020-07-28 DIAGNOSIS — M48062 Spinal stenosis, lumbar region with neurogenic claudication: Principal | ICD-10-CM | POA: Insufficient documentation

## 2020-07-28 DIAGNOSIS — M5440 Lumbago with sciatica, unspecified side: Secondary | ICD-10-CM | POA: Diagnosis present

## 2020-07-28 DIAGNOSIS — Z419 Encounter for procedure for purposes other than remedying health state, unspecified: Secondary | ICD-10-CM

## 2020-07-28 HISTORY — PX: HARDWARE REMOVAL: SHX979

## 2020-07-28 LAB — SURGICAL PCR SCREEN
MRSA, PCR: NEGATIVE
Staphylococcus aureus: NEGATIVE

## 2020-07-28 LAB — BASIC METABOLIC PANEL
Anion gap: 12 (ref 5–15)
BUN: 15 mg/dL (ref 6–20)
CO2: 24 mmol/L (ref 22–32)
Calcium: 9.9 mg/dL (ref 8.9–10.3)
Chloride: 104 mmol/L (ref 98–111)
Creatinine, Ser: 0.61 mg/dL (ref 0.44–1.00)
GFR, Estimated: >60 mL/min (ref >60)
Glucose, Bld: 89 mg/dL (ref 70–99)
Potassium: 3.4 mmol/L — ABNORMAL LOW (ref 3.5–5.1)
Sodium: 140 mmol/L (ref 135–145)

## 2020-07-28 LAB — CBC
HCT: 44.3 % (ref 36.0–46.0)
Hemoglobin: 13.3 g/dL (ref 12.0–15.0)
MCH: 23.1 pg — ABNORMAL LOW (ref 26.0–34.0)
MCHC: 30 g/dL (ref 30.0–36.0)
MCV: 77 fL — ABNORMAL LOW (ref 80.0–100.0)
Platelets: 321 10*3/uL (ref 150–400)
RBC: 5.75 MIL/uL — ABNORMAL HIGH (ref 3.87–5.11)
RDW: 22.5 % — ABNORMAL HIGH (ref 11.5–15.5)
WBC: 12.4 10*3/uL — ABNORMAL HIGH (ref 4.0–10.5)
nRBC: 0 % (ref 0.0–0.2)

## 2020-07-28 LAB — TYPE AND SCREEN
ABO/RH(D): B POS
Antibody Screen: NEGATIVE

## 2020-07-28 LAB — SARS CORONAVIRUS 2 BY RT PCR (HOSPITAL ORDER, PERFORMED IN ~~LOC~~ HOSPITAL LAB): SARS Coronavirus 2: NEGATIVE

## 2020-07-28 SURGERY — REMOVAL, HARDWARE
Anesthesia: General | Site: Spine Lumbar

## 2020-07-28 MED ORDER — ROCURONIUM BROMIDE 10 MG/ML (PF) SYRINGE
PREFILLED_SYRINGE | INTRAVENOUS | Status: DC | PRN
  Administered 2020-07-28: 90 mg via INTRAVENOUS
  Administered 2020-07-28: 10 mg via INTRAVENOUS

## 2020-07-28 MED ORDER — 0.9 % SODIUM CHLORIDE (POUR BTL) OPTIME
TOPICAL | Status: DC | PRN
  Administered 2020-07-28: 1000 mL

## 2020-07-28 MED ORDER — OXYCODONE HCL ER 10 MG PO T12A
10.0000 mg | EXTENDED_RELEASE_TABLET | Freq: Two times a day (BID) | ORAL | Status: DC
  Administered 2020-07-28: 10 mg via ORAL
  Filled 2020-07-28: qty 1

## 2020-07-28 MED ORDER — VANCOMYCIN HCL 1000 MG IV SOLR
INTRAVENOUS | Status: AC
  Filled 2020-07-28: qty 1000

## 2020-07-28 MED ORDER — POTASSIUM CHLORIDE IN NACL 20-0.9 MEQ/L-% IV SOLN: INTRAVENOUS | Status: DC

## 2020-07-28 MED ORDER — OXYCODONE HCL 5 MG PO TABS
5.0000 mg | ORAL_TABLET | Freq: Once | ORAL | Status: AC | PRN
  Administered 2020-07-28: 5 mg via ORAL

## 2020-07-28 MED ORDER — SUGAMMADEX SODIUM 200 MG/2ML IV SOLN
INTRAVENOUS | Status: DC | PRN
  Administered 2020-07-28: 200 mg via INTRAVENOUS

## 2020-07-28 MED ORDER — DIPHENHYDRAMINE HCL 50 MG PO CAPS
50.0000 mg | ORAL_CAPSULE | Freq: Three times a day (TID) | ORAL | Status: DC
  Administered 2020-07-28 (×2): 50 mg via ORAL
  Filled 2020-07-28 (×4): qty 2
  Filled 2020-07-28: qty 1

## 2020-07-28 MED ORDER — ONDANSETRON HCL 4 MG PO TABS: 4.0000 mg | ORAL_TABLET | Freq: Four times a day (QID) | ORAL | Status: DC | PRN

## 2020-07-28 MED ORDER — CEPHALEXIN 500 MG PO CAPS
500.0000 mg | ORAL_CAPSULE | Freq: Four times a day (QID) | ORAL | Status: DC
  Administered 2020-07-28 (×2): 500 mg via ORAL
  Filled 2020-07-28 (×4): qty 1

## 2020-07-28 MED ORDER — HYDROXYZINE HCL 25 MG PO TABS
50.0000 mg | ORAL_TABLET | Freq: Two times a day (BID) | ORAL | Status: DC
  Administered 2020-07-28: 50 mg via ORAL
  Filled 2020-07-28: qty 2

## 2020-07-28 MED ORDER — BUPIVACAINE HCL (PF) 0.25 % IJ SOLN
INTRAMUSCULAR | Status: DC | PRN
  Administered 2020-07-28: 30 mL

## 2020-07-28 MED ORDER — DIAZEPAM 5 MG PO TABS
5.0000 mg | ORAL_TABLET | Freq: Two times a day (BID) | ORAL | Status: DC
  Administered 2020-07-28: 5 mg via ORAL
  Filled 2020-07-28: qty 1

## 2020-07-28 MED ORDER — PANTOPRAZOLE SODIUM 40 MG PO TBEC: 40.0000 mg | DELAYED_RELEASE_TABLET | Freq: Every day | ORAL | Status: DC

## 2020-07-28 MED ORDER — THROMBIN 20000 UNITS EX SOLR
CUTANEOUS | Status: AC
  Filled 2020-07-28: qty 20000

## 2020-07-28 MED ORDER — ALBUTEROL SULFATE HFA 108 (90 BASE) MCG/ACT IN AERS
2.0000 | INHALATION_SPRAY | Freq: Four times a day (QID) | RESPIRATORY_TRACT | Status: DC | PRN
  Filled 2020-07-28: qty 6.7

## 2020-07-28 MED ORDER — CAL MAG ZINC +D3 PO TABS: 1.0000 | ORAL_TABLET | Freq: Three times a day (TID) | ORAL | Status: DC

## 2020-07-28 MED ORDER — BUPIVACAINE HCL (PF) 0.25 % IJ SOLN
INTRAMUSCULAR | Status: AC
  Filled 2020-07-28: qty 30

## 2020-07-28 MED ORDER — GABAPENTIN 600 MG PO TABS
1200.0000 mg | ORAL_TABLET | Freq: Three times a day (TID) | ORAL | Status: DC
  Administered 2020-07-28 (×2): 1200 mg via ORAL
  Filled 2020-07-28 (×2): qty 2

## 2020-07-28 MED ORDER — ONDANSETRON HCL 4 MG/2ML IJ SOLN: 4.0000 mg | Freq: Four times a day (QID) | INTRAMUSCULAR | Status: DC | PRN

## 2020-07-28 MED ORDER — PHENOL 1.4 % MT LIQD: 1.0000 | OROMUCOSAL | Status: DC | PRN

## 2020-07-28 MED ORDER — CYCLOBENZAPRINE HCL 10 MG PO TABS
ORAL_TABLET | ORAL | Status: AC
  Filled 2020-07-28: qty 1

## 2020-07-28 MED ORDER — POTASSIUM CHLORIDE ER 10 MEQ PO TBCR
10.0000 meq | EXTENDED_RELEASE_TABLET | Freq: Every day | ORAL | Status: DC
  Administered 2020-07-28: 10 meq via ORAL
  Filled 2020-07-28 (×2): qty 1

## 2020-07-28 MED ORDER — DEXAMETHASONE SODIUM PHOSPHATE 10 MG/ML IJ SOLN
INTRAMUSCULAR | Status: DC | PRN
  Administered 2020-07-28 (×2): 5 mg via INTRAVENOUS

## 2020-07-28 MED ORDER — VANCOMYCIN HCL 1000 MG IV SOLR
INTRAVENOUS | Status: DC | PRN
  Administered 2020-07-28: 1000 mg

## 2020-07-28 MED ORDER — SODIUM CHLORIDE 0.9% FLUSH: 3.0000 mL | Freq: Two times a day (BID) | INTRAVENOUS | Status: DC

## 2020-07-28 MED ORDER — CHLORHEXIDINE GLUCONATE 0.12 % MT SOLN
15.0000 mL | Freq: Once | OROMUCOSAL | Status: AC
  Administered 2020-07-28: 15 mL via OROMUCOSAL
  Filled 2020-07-28: qty 15

## 2020-07-28 MED ORDER — HYDROMORPHONE HCL 1 MG/ML IJ SOLN
INTRAMUSCULAR | Status: AC
  Filled 2020-07-28: qty 1

## 2020-07-28 MED ORDER — PROMETHAZINE HCL 25 MG/ML IJ SOLN: 6.2500 mg | INTRAMUSCULAR | Status: DC | PRN

## 2020-07-28 MED ORDER — OXYCODONE HCL 5 MG/5ML PO SOLN: 5.0000 mg | Freq: Once | ORAL | Status: AC | PRN

## 2020-07-28 MED ORDER — LIDOCAINE 2% (20 MG/ML) 5 ML SYRINGE
INTRAMUSCULAR | Status: AC
  Filled 2020-07-28: qty 5

## 2020-07-28 MED ORDER — DEXAMETHASONE SODIUM PHOSPHATE 10 MG/ML IJ SOLN
INTRAMUSCULAR | Status: AC
  Filled 2020-07-28: qty 2

## 2020-07-28 MED ORDER — MORPHINE SULFATE (PF) 2 MG/ML IV SOLN
2.0000 mg | INTRAVENOUS | Status: DC | PRN
  Administered 2020-07-28: 2 mg via INTRAVENOUS
  Filled 2020-07-28: qty 1

## 2020-07-28 MED ORDER — ATENOLOL 50 MG PO TABS: 50.0000 mg | ORAL_TABLET | Freq: Every day | ORAL | Status: DC

## 2020-07-28 MED ORDER — LUBIPROSTONE 24 MCG PO CAPS
24.0000 ug | ORAL_CAPSULE | Freq: Two times a day (BID) | ORAL | Status: DC
  Administered 2020-07-28: 24 ug via ORAL
  Filled 2020-07-28 (×2): qty 1

## 2020-07-28 MED ORDER — CHLORHEXIDINE GLUCONATE CLOTH 2 % EX PADS: 6.0000 | MEDICATED_PAD | Freq: Once | CUTANEOUS | Status: DC

## 2020-07-28 MED ORDER — CYCLOBENZAPRINE HCL 10 MG PO TABS
10.0000 mg | ORAL_TABLET | Freq: Three times a day (TID) | ORAL | Status: DC | PRN
  Administered 2020-07-28 – 2020-07-29 (×3): 10 mg via ORAL
  Filled 2020-07-28 (×2): qty 1

## 2020-07-28 MED ORDER — GENTAMICIN SULFATE 40 MG/ML IJ SOLN
INTRAMUSCULAR | Status: AC
  Filled 2020-07-28: qty 8

## 2020-07-28 MED ORDER — ACETAMINOPHEN 325 MG PO TABS: 650.0000 mg | ORAL_TABLET | ORAL | Status: DC | PRN

## 2020-07-28 MED ORDER — PROPOFOL 10 MG/ML IV BOLUS
INTRAVENOUS | Status: DC | PRN
  Administered 2020-07-28: 120 mg via INTRAVENOUS

## 2020-07-28 MED ORDER — RIFAMPIN 300 MG PO CAPS
300.0000 mg | ORAL_CAPSULE | Freq: Two times a day (BID) | ORAL | Status: DC
  Administered 2020-07-28: 300 mg via ORAL
  Filled 2020-07-28 (×2): qty 1

## 2020-07-28 MED ORDER — SUCCINYLCHOLINE CHLORIDE 200 MG/10ML IV SOSY
PREFILLED_SYRINGE | INTRAVENOUS | Status: AC
  Filled 2020-07-28: qty 10

## 2020-07-28 MED ORDER — ROCURONIUM BROMIDE 10 MG/ML (PF) SYRINGE
PREFILLED_SYRINGE | INTRAVENOUS | Status: AC
  Filled 2020-07-28: qty 10

## 2020-07-28 MED ORDER — LACTATED RINGERS IV SOLN: INTRAVENOUS | Status: DC

## 2020-07-28 MED ORDER — ASCORBIC ACID 500 MG PO TABS
500.0000 mg | ORAL_TABLET | Freq: Every day | ORAL | Status: DC
  Administered 2020-07-28: 500 mg via ORAL
  Filled 2020-07-28: qty 1

## 2020-07-28 MED ORDER — PHENYLEPHRINE 40 MCG/ML (10ML) SYRINGE FOR IV PUSH (FOR BLOOD PRESSURE SUPPORT)
PREFILLED_SYRINGE | INTRAVENOUS | Status: AC
  Filled 2020-07-28: qty 10

## 2020-07-28 MED ORDER — PREMIER PROTEIN SHAKE: 11.0000 [oz_av] | Freq: Three times a day (TID) | ORAL | Status: DC

## 2020-07-28 MED ORDER — ENSURE MAX PROTEIN PO LIQD
11.0000 [oz_av] | Freq: Three times a day (TID) | ORAL | Status: DC
  Administered 2020-07-28: 11 [oz_av] via ORAL
  Filled 2020-07-28 (×3): qty 330

## 2020-07-28 MED ORDER — ACETAMINOPHEN 500 MG PO TABS: 500.0000 mg | ORAL_TABLET | Freq: Three times a day (TID) | ORAL | Status: DC | PRN

## 2020-07-28 MED ORDER — FENTANYL CITRATE (PF) 250 MCG/5ML IJ SOLN
INTRAMUSCULAR | Status: DC | PRN
  Administered 2020-07-28: 50 ug via INTRAVENOUS
  Administered 2020-07-28: 100 ug via INTRAVENOUS
  Administered 2020-07-28 (×2): 50 ug via INTRAVENOUS

## 2020-07-28 MED ORDER — PROPOFOL 10 MG/ML IV BOLUS
INTRAVENOUS | Status: AC
  Filled 2020-07-28: qty 20

## 2020-07-28 MED ORDER — MIDAZOLAM HCL 2 MG/2ML IJ SOLN
INTRAMUSCULAR | Status: AC
  Filled 2020-07-28: qty 2

## 2020-07-28 MED ORDER — SODIUM CHLORIDE 0.9% FLUSH: 3.0000 mL | INTRAVENOUS | Status: DC | PRN

## 2020-07-28 MED ORDER — ACETAMINOPHEN 10 MG/ML IV SOLN: 1000.0000 mg | Freq: Once | INTRAVENOUS | Status: DC | PRN

## 2020-07-28 MED ORDER — OXYCODONE HCL 5 MG PO TABS
ORAL_TABLET | ORAL | Status: AC
Start: 2020-07-28 — End: 2020-07-29
  Filled 2020-07-28: qty 1

## 2020-07-28 MED ORDER — ONDANSETRON HCL 4 MG/2ML IJ SOLN
INTRAMUSCULAR | Status: DC | PRN
  Administered 2020-07-28: 4 mg via INTRAVENOUS

## 2020-07-28 MED ORDER — ADULT MULTIVITAMIN W/MINERALS CH
1.0000 | ORAL_TABLET | Freq: Every day | ORAL | Status: DC
  Administered 2020-07-28: 1 via ORAL
  Filled 2020-07-28: qty 1

## 2020-07-28 MED ORDER — CEFAZOLIN SODIUM-DEXTROSE 2-4 GM/100ML-% IV SOLN
2.0000 g | INTRAVENOUS | Status: AC
  Administered 2020-07-28: 2 g via INTRAVENOUS
  Filled 2020-07-28: qty 100

## 2020-07-28 MED ORDER — MUPIROCIN 2 % EX OINT
TOPICAL_OINTMENT | CUTANEOUS | Status: AC
  Filled 2020-07-28: qty 22

## 2020-07-28 MED ORDER — OXYCODONE-ACETAMINOPHEN 10-325 MG PO TABS: 1.0000 | ORAL_TABLET | Freq: Four times a day (QID) | ORAL | Status: DC | PRN

## 2020-07-28 MED ORDER — ORAL CARE MOUTH RINSE: 15.0000 mL | Freq: Once | OROMUCOSAL | Status: AC

## 2020-07-28 MED ORDER — SODIUM CHLORIDE 0.9 % IV SOLN: 250.0000 mL | INTRAVENOUS | Status: DC

## 2020-07-28 MED ORDER — OXYCODONE HCL 5 MG PO TABS
5.0000 mg | ORAL_TABLET | Freq: Four times a day (QID) | ORAL | Status: DC | PRN
  Administered 2020-07-29: 5 mg via ORAL
  Filled 2020-07-28: qty 1

## 2020-07-28 MED ORDER — GUAIFENESIN ER 600 MG PO TB12
600.0000 mg | ORAL_TABLET | Freq: Two times a day (BID) | ORAL | Status: DC | PRN
  Filled 2020-07-28: qty 1

## 2020-07-28 MED ORDER — CHLORZOXAZONE 500 MG PO TABS
500.0000 mg | ORAL_TABLET | Freq: Three times a day (TID) | ORAL | Status: DC
  Administered 2020-07-28 (×2): 500 mg via ORAL
  Filled 2020-07-28 (×3): qty 1

## 2020-07-28 MED ORDER — LIDOCAINE-EPINEPHRINE 0.5 %-1:200000 IJ SOLN
INTRAMUSCULAR | Status: AC
  Filled 2020-07-28: qty 1

## 2020-07-28 MED ORDER — HYDROMORPHONE HCL 1 MG/ML IJ SOLN
0.2500 mg | INTRAMUSCULAR | Status: DC | PRN
  Administered 2020-07-28: 0.5 mg via INTRAVENOUS
  Administered 2020-07-28 (×2): 0.25 mg via INTRAVENOUS
  Administered 2020-07-28: 0.5 mg via INTRAVENOUS

## 2020-07-28 MED ORDER — ONDANSETRON HCL 4 MG/2ML IJ SOLN
INTRAMUSCULAR | Status: AC
  Filled 2020-07-28: qty 2

## 2020-07-28 MED ORDER — GENTAMICIN SULFATE 40 MG/ML IJ SOLN
INTRAMUSCULAR | Status: DC | PRN
  Administered 2020-07-28: 160 mg

## 2020-07-28 MED ORDER — SENNA 8.6 MG PO TABS: 1.0000 | ORAL_TABLET | Freq: Two times a day (BID) | ORAL | Status: DC | PRN

## 2020-07-28 MED ORDER — MIDAZOLAM HCL 5 MG/5ML IJ SOLN
INTRAMUSCULAR | Status: DC | PRN
  Administered 2020-07-28: 2 mg via INTRAVENOUS

## 2020-07-28 MED ORDER — OXYCODONE-ACETAMINOPHEN 5-325 MG PO TABS
1.0000 | ORAL_TABLET | Freq: Four times a day (QID) | ORAL | Status: DC | PRN
  Administered 2020-07-29: 1 via ORAL
  Filled 2020-07-28: qty 1

## 2020-07-28 MED ORDER — ACETAMINOPHEN 650 MG RE SUPP: 650.0000 mg | RECTAL | Status: DC | PRN

## 2020-07-28 MED ORDER — FENTANYL CITRATE (PF) 250 MCG/5ML IJ SOLN
INTRAMUSCULAR | Status: AC
  Filled 2020-07-28: qty 5

## 2020-07-28 MED ORDER — ASPIRIN EC 81 MG PO TBEC
81.0000 mg | DELAYED_RELEASE_TABLET | Freq: Every day | ORAL | Status: DC
  Administered 2020-07-28: 81 mg via ORAL
  Filled 2020-07-28: qty 1

## 2020-07-28 MED ORDER — HYDROCHLOROTHIAZIDE 25 MG PO TABS: 25.0000 mg | ORAL_TABLET | Freq: Every day | ORAL | Status: DC

## 2020-07-28 MED ORDER — LIDOCAINE 2% (20 MG/ML) 5 ML SYRINGE
INTRAMUSCULAR | Status: DC | PRN
  Administered 2020-07-28: 100 mg via INTRAVENOUS

## 2020-07-28 MED ORDER — PHENYLEPHRINE 40 MCG/ML (10ML) SYRINGE FOR IV PUSH (FOR BLOOD PRESSURE SUPPORT)
PREFILLED_SYRINGE | INTRAVENOUS | Status: DC | PRN
  Administered 2020-07-28: 80 ug via INTRAVENOUS
  Administered 2020-07-28: 20 ug via INTRAVENOUS

## 2020-07-28 MED ORDER — MENTHOL 3 MG MT LOZG: 1.0000 | LOZENGE | OROMUCOSAL | Status: DC | PRN

## 2020-07-28 MED ORDER — EPHEDRINE 5 MG/ML INJ
INTRAVENOUS | Status: AC
  Filled 2020-07-28: qty 10

## 2020-07-28 MED ORDER — THROMBIN 20000 UNITS EX SOLR
CUTANEOUS | Status: DC | PRN
  Administered 2020-07-28: 20 mL via TOPICAL

## 2020-07-28 MED ORDER — CELECOXIB 200 MG PO CAPS
200.0000 mg | ORAL_CAPSULE | Freq: Two times a day (BID) | ORAL | Status: DC
  Filled 2020-07-28 (×2): qty 1

## 2020-07-28 SURGICAL SUPPLY — 61 items
ADH SKN CLS APL DERMABOND .7 (GAUZE/BANDAGES/DRESSINGS) ×1
APL SKNCLS STERI-STRIP NONHPOA (GAUZE/BANDAGES/DRESSINGS)
BAND INSRT 18 STRL LF DISP RB (MISCELLANEOUS) ×2
BAND RUBBER #18 3X1/16 STRL (MISCELLANEOUS) ×6 IMPLANT
BENZOIN TINCTURE PRP APPL 2/3 (GAUZE/BANDAGES/DRESSINGS) IMPLANT
BLADE CLIPPER SURG (BLADE) IMPLANT
BUR MATCHSTICK NEURO 3.0 LAGG (BURR) ×1 IMPLANT
CANISTER SUCT 3000ML PPV (MISCELLANEOUS) ×3 IMPLANT
CARTRIDGE OIL MAESTRO DRILL (MISCELLANEOUS) ×1 IMPLANT
CLOSURE WOUND 1/2 X4 (GAUZE/BANDAGES/DRESSINGS)
COVER WAND RF STERILE (DRAPES) ×3 IMPLANT
DECANTER SPIKE VIAL GLASS SM (MISCELLANEOUS) ×3 IMPLANT
DERMABOND ADVANCED (GAUZE/BANDAGES/DRESSINGS) ×2
DERMABOND ADVANCED .7 DNX12 (GAUZE/BANDAGES/DRESSINGS) ×1 IMPLANT
DIFFUSER DRILL AIR PNEUMATIC (MISCELLANEOUS) ×3 IMPLANT
DRAPE C-ARM 42X72 X-RAY (DRAPES) ×4 IMPLANT
DRAPE LAPAROTOMY 100X72X124 (DRAPES) ×3 IMPLANT
DRAPE MICROSCOPE LEICA (MISCELLANEOUS) ×1 IMPLANT
DRAPE SURG 17X23 STRL (DRAPES) ×3 IMPLANT
DURAPREP 26ML APPLICATOR (WOUND CARE) ×3 IMPLANT
ELECT REM PT RETURN 9FT ADLT (ELECTROSURGICAL) ×3
ELECTRODE REM PT RTRN 9FT ADLT (ELECTROSURGICAL) ×1 IMPLANT
GAUZE 4X4 16PLY RFD (DISPOSABLE) IMPLANT
GAUZE SPONGE 4X4 12PLY STRL (GAUZE/BANDAGES/DRESSINGS) IMPLANT
GLOVE BIOGEL PI IND STRL 7.5 (GLOVE) IMPLANT
GLOVE BIOGEL PI INDICATOR 7.5 (GLOVE) ×2
GLOVE ECLIPSE 6.5 STRL STRAW (GLOVE) ×1 IMPLANT
GLOVE EXAM NITRILE XL STR (GLOVE) ×2 IMPLANT
GLOVE SURG SS PI 6.5 STRL IVOR (GLOVE) ×6 IMPLANT
GLOVE SURG SS PI 7.5 STRL IVOR (GLOVE) ×2 IMPLANT
GLOVE SURG SS PI 8.0 STRL IVOR (GLOVE) ×2 IMPLANT
GOWN STRL REUS W/ TWL LRG LVL3 (GOWN DISPOSABLE) ×2 IMPLANT
GOWN STRL REUS W/ TWL XL LVL3 (GOWN DISPOSABLE) IMPLANT
GOWN STRL REUS W/TWL 2XL LVL3 (GOWN DISPOSABLE) IMPLANT
GOWN STRL REUS W/TWL LRG LVL3 (GOWN DISPOSABLE) ×6
GOWN STRL REUS W/TWL XL LVL3 (GOWN DISPOSABLE)
KIT BASIN OR (CUSTOM PROCEDURE TRAY) ×3 IMPLANT
KIT POSITION SURG JACKSON T1 (MISCELLANEOUS) ×2 IMPLANT
KIT STIMULAN  10CC (Orthopedic Implant) ×3 IMPLANT
KIT STIMULAN 10CC (Orthopedic Implant) IMPLANT
KIT TURNOVER KIT B (KITS) ×3 IMPLANT
NDL HYPO 25X1 1.5 SAFETY (NEEDLE) ×1 IMPLANT
NDL SPNL 18GX3.5 QUINCKE PK (NEEDLE) IMPLANT
NEEDLE HYPO 25X1 1.5 SAFETY (NEEDLE) ×3 IMPLANT
NEEDLE SPNL 18GX3.5 QUINCKE PK (NEEDLE) IMPLANT
NS IRRIG 1000ML POUR BTL (IV SOLUTION) ×3 IMPLANT
OIL CARTRIDGE MAESTRO DRILL (MISCELLANEOUS)
PACK LAMINECTOMY NEURO (CUSTOM PROCEDURE TRAY) ×3 IMPLANT
PAD ARMBOARD 7.5X6 YLW CONV (MISCELLANEOUS) ×3 IMPLANT
SPONGE LAP 4X18 RFD (DISPOSABLE) IMPLANT
SPONGE SURGIFOAM ABS GEL SZ50 (HEMOSTASIS) ×3 IMPLANT
STRIP CLOSURE SKIN 1/2X4 (GAUZE/BANDAGES/DRESSINGS) IMPLANT
SUT VIC AB 0 CT1 18XCR BRD8 (SUTURE) ×1 IMPLANT
SUT VIC AB 0 CT1 8-18 (SUTURE) ×3
SUT VIC AB 2-0 CT1 18 (SUTURE) ×3 IMPLANT
SUT VIC AB 3-0 SH 8-18 (SUTURE) ×3 IMPLANT
TOWEL GREEN STERILE (TOWEL DISPOSABLE) ×3 IMPLANT
TOWEL GREEN STERILE FF (TOWEL DISPOSABLE) ×3 IMPLANT
TRAY FOL W/BAG SLVR 16FR STRL (SET/KITS/TRAYS/PACK) IMPLANT
TRAY FOLEY W/BAG SLVR 16FR LF (SET/KITS/TRAYS/PACK) ×3
WATER STERILE IRR 1000ML POUR (IV SOLUTION) ×3 IMPLANT

## 2020-07-28 NOTE — H&P (Signed)
BP (!) 167/79   Pulse 69   Temp 98.1 F (36.7 C) (Oral)   Resp 20   Ht 5\' 3"  (1.6 m)   Wt 90.7 kg   LMP 11/20/2012   SpO2 96%   BMI 35.43 kg/m  [name] is well known to me. She has had multiple lumbar procedures and most recently dealt with a wound infection after her thoracic decompression and lumbar arthrodesis at L2/3. She has complained of increasing pain in the lumbar region. The repeat CT is revealing in as much as the pedicle screws at L2 are completely out of the pedicles at this point.  The hardware is not loose.  I think the bone has moved around the hardware quite honestly, but nonetheless, she appears to have a solid arthrodesis through the cages, so my plan is simply to remove the upper hardware, the screws and then just leave her alone afterwards. L1-2 is degenerating more, but that would mean crossing the thoracolumbar junction right now and right now we just need to get in and get out.  She had the wound infection previously and still has a tunnel in the thoracic region.  So there is a fracture at the pedicle near the base of the vertebral body and I do believe all of this can heal, but the hardware is making it much harder to heal.  Therefore, removing the screws and rod I believe is the best notion.    Allergies  Allergen Reactions  . Monosodium Glutamate Anaphylaxis and Swelling    Eyes swollen shut, facial swelling, tongue swelling.  . Shellfish Allergy Anaphylaxis  . Celebrex [Celecoxib] Itching    Only allergic to generic brand  . Contrast Media [Iodinated Diagnostic Agents] Itching and Nausea Only    "could not walk"  . Diclofenac Itching    Can take the name brand Voltaren  . Other Other (See Comments)    Pet dander, Mildew, Mold  Cannot tolerate generic DICLOFENAC GEL--MUST USE BRAND NAME (GENERIC CAUSES ITCHING)   . Latex Itching and Rash    Latex glove with powder   Family History  Problem Relation Age of Onset  . Stroke Father        passed 2004 from  pneumonia  . Alcohol abuse Father   . Pulmonary embolism Mother        after minor knee surgery leading to DVT  . Arthritis Sister   . Depression Sister   . Hypertension Sister   . Diabetes Other        grandmother  . Anesthesia problems Neg Hx   . Hypotension Neg Hx   . Malignant hyperthermia Neg Hx   . Pseudochol deficiency Neg Hx    Social History   Socioeconomic History  . Marital status: Divorced    Spouse name: Not on file  . Number of children: Not on file  . Years of education: Not on file  . Highest education level: Not on file  Occupational History  . Not on file  Tobacco Use  . Smoking status: Former Smoker    Packs/day: 0.50    Years: 10.00    Pack years: 5.00    Quit date: 12/06/1998    Years since quitting: 21.6  . Smokeless tobacco: Never Used  Vaping Use  . Vaping Use: Never used  Substance and Sexual Activity  . Alcohol use: No  . Drug use: No  . Sexual activity: Never    Birth control/protection: Post-menopausal  Other Topics Concern  .  Not on file  Social History Narrative   Divorced. 1 son.    Disabled from fibromyalgia, arthritis, back and knee surgeries.    Graduated from High school.       Sister is a patient in our clinic, [name].    Recently cared for at [facility]. Changed to [place] as new network.    Social Determinants of Health   Financial Resource Strain:   . Difficulty of Paying Living Expenses: Not on file  Food Insecurity:   . Worried About Programme researcher, broadcasting/film/video in the Last Year: Not on file  . Ran Out of Food in the Last Year: Not on file  Transportation Needs:   . Lack of Transportation (Medical): Not on file  . Lack of Transportation (Non-Medical): Not on file  Physical Activity:   . Days of Exercise per Week: Not on file  . Minutes of Exercise per Session: Not on file  Stress:   . Feeling of Stress : Not on file  Social Connections:   . Frequency of Communication with Friends and Family: Not on file  .  Frequency of Social Gatherings with Friends and Family: Not on file  . Attends Religious Services: Not on file  . Active Member of Clubs or Organizations: Not on file  . Attends Banker Meetings: Not on file  . Marital Status: Not on file  Intimate Partner Violence:   . Fear of Current or Ex-Partner: Not on file  . Emotionally Abused: Not on file  . Physically Abused: Not on file  . Sexually Abused: Not on file   Prior to Admission medications   Medication Sig Start Date End Date Taking? Authorizing Provider  acetaminophen (TYLENOL) 500 MG tablet Take 1,500 mg by mouth every 8 (eight) hours as needed for moderate pain.   Yes [provider]  albuterol (VENTOLIN HFA) 108 (90 Base) MCG/ACT inhaler Inhale 2 puffs into the lungs every 6 (six) hours as needed for wheezing or shortness of breath. 03/24/20  Yes Medina-Vargas, Monina C, NP  ascorbic acid (VITAMIN C) 500 MG tablet Take 500 mg by mouth daily.   Yes [provider]  aspirin EC 81 MG tablet Take 81 mg by mouth daily.   Yes [provider]  atenolol (TENORMIN) 50 MG tablet Take 1 tablet (50 mg total) by mouth daily. 03/24/20  Yes Medina-Vargas, Monina C, NP  CELEBREX 200 MG capsule Take 1 capsule (200 mg total) by mouth 2 (two) times daily. 03/24/20  Yes Medina-Vargas, Monina C, NP  cephALEXin (KEFLEX) 500 MG capsule Take 1 capsule (500 mg total) by mouth 4 (four) times daily. 07/06/20  Yes Judyann Munson, MD  chlorzoxazone (PARAFON) 500 MG tablet Take 1 tablet (500 mg total) by mouth 3 (three) times daily. 03/24/20  Yes Medina-Vargas, Monina C, NP  cyclobenzaprine (FLEXERIL) 10 MG tablet Take 10 mg by mouth 2 (two) times daily as needed for spasms. 07/17/20  Yes [provider]  diazepam (VALIUM) 5 MG tablet Take 1 tablet (5 mg total) by mouth 2 (two) times daily. 03/22/20  Yes Medina-Vargas, Monina C, NP  diclofenac (FLECTOR) 1.3 % PTCH Place 1 patch onto the skin 2 (two) times daily as needed  (pain.).   Yes [provider]  diphenhydrAMINE (BENADRYL) 25 MG tablet Take 50 mg by mouth in the morning, at noon, in the evening, and at bedtime.   Yes [provider]  gabapentin (NEURONTIN) 600 MG tablet Take 2 tablets (  1,200 mg total) by mouth 3 (three) times daily. 03/24/20  Yes Medina-Vargas, Monina C, NP  guaiFENesin (MUCINEX) 600 MG 12 hr tablet Take 600 mg by mouth 2 (two) times daily as needed for cough.   Yes [provider]  hydrochlorothiazide (HYDRODIURIL) 25 MG tablet Take 1 tablet (25 mg total) by mouth daily. One tablet by mouth in the morning. 03/24/20  Yes Medina-Vargas, Monina C, NP  hydrOXYzine (ATARAX/VISTARIL) 50 MG tablet Take 50 mg by mouth 2 (two) times daily. 06/23/20  Yes [provider]  lubiprostone (AMITIZA) 24 MCG capsule Take 1 capsule (24 mcg total) by mouth 2 (two) times daily with a meal. 03/24/20  Yes Medina-Vargas, Monina C, NP  methocarbamol (ROBAXIN) 500 MG tablet Take 1 tablet (500 mg total) by mouth 4 (four) times daily. 03/24/20  Yes Medina-Vargas, Monina C, NP  Multiple Minerals-Vitamins (CAL MAG ZINC +D3 PO) Take 1 tablet by mouth in the morning, at noon, and at bedtime.   Yes [provider]  omeprazole (PRILOSEC) 20 MG capsule Take 20 mg by mouth in the morning and at bedtime.    Yes [provider]  oxyCODONE-acetaminophen (PERCOCET) 10-325 MG tablet Take 1 tablet by mouth every 6 (six) hours as needed for pain. Patient taking differently: Take 1 tablet by mouth in the morning, at noon, in the evening, and at bedtime.  03/22/20  Yes Medina-Vargas, Monina C, NP  potassium chloride (KLOR-CON) 10 MEQ tablet Take 1 tablet (10 mEq total) by mouth daily. 03/24/20  Yes Medina-Vargas, Monina C, NP  protein supplement shake (PREMIER PROTEIN) LIQD Take 11 oz by mouth in the morning and at bedtime.   Yes [provider]  rifampin (RIFADIN) 300 MG capsule Take 300 mg by mouth 2 (two) times daily. 06/23/20  Yes  [provider]  senna (SENOKOT) 8.6 MG TABS tablet Take 1 tablet by mouth 2 (two) times daily as needed (constipation.).    Yes [provider]  Calcium Carb-Cholecalciferol (CALCIUM 600 + D) 600-200 MG-UNIT TABS Take 1 tablet by mouth in the morning and at bedtime. Patient not taking: Reported on 07/18/2020 03/24/20   Medina-Vargas, Monina C, NP  furosemide (LASIX) 20 MG tablet Take 2 tablets (40 mg total) by mouth daily. Patient not taking: Reported on 07/18/2020 03/24/20   Medina-Vargas, Monina C, NP  Multiple Vitamin (MULTIVITAMIN WITH MINERALS) TABS tablet Take 1 tablet by mouth daily.    [provider]   Past Surgical History:  Procedure Laterality Date  . ANTERIOR LUMBAR FUSION  09/20/2011   Procedure: ANTERIOR LUMBAR FUSION 1 LEVEL;  Surgeon: Carmela HurtKyle L Ace Bergfeld;  Location: [facility];  Service: Neurosurgery;  Laterality: N/A;  Lumbar five-Sacral One Anterior Lumbar Interbody Fusion /Dr. Early to Approach   . CERVICAL DISC SURGERY     WITH TITANIUM PLATE IN NECK---LEFT SIDE  . CERVICAL SPINE SURGERY  03/24/2019  . CESAREAN SECTION    . fibroidectomy    . FRACTURE SURGERY  05/08/2010   Jones fracture left foot fifth metatarsal  . HARDWARE REMOVAL Right 05/28/2016   Procedure: RIGHT ANKLE HARDWARE REMOVAL;  Surgeon: Salvatore Marvelobert Wainer, MD;  Location: [facility];  Service: Orthopedics;  Laterality: Right;  . IRRIGATION AND DEBRIDEMENT KNEE  04/09/2012   Procedure: IRRIGATION AND DEBRIDEMENT KNEE;  Surgeon: Thera FlakeW D Caffrey Jr., MD;  Location: [facility] OR;  Service: Orthopedics;  Laterality: Right;  . JOINT REPLACEMENT  01-27-2012   left total knee and Right total knee  . KNEE  SURGERY  2005   Left knee arthroscopy  . NASAL SEPTOPLASTY W/ TURBINOPLASTY  2007   due to recurrent sinusitis  . ORIF ANKLE FRACTURE Right 06/21/2015   Procedure: OPEN REDUCTION INTERNAL FIXATION RIGHT DISTAL FIBULA  FRACTURE AND OPEN REDUCTION INTERNAL FIXATION SYNDESMOSIS ;  Surgeon: Salvatore Marvel, MD;  Location: [facility];  Service: Orthopedics;  Laterality: Right;  . SHOULDER ARTHROSCOPY W/ ROTATOR CUFF REPAIR Right   . SHOULDER SURGERY Left   . SPINE SURGERY  2004   Cervical plate, ACDF  . STERIOD INJECTION  01/27/2012   Procedure: STEROID INJECTION;  Surgeon: Nilda Simmer, MD;  Location: [facility] OR;  Service: Orthopedics;  Laterality: Right;  . TEE WITHOUT CARDIOVERSION N/A 03/01/2020   Procedure: TRANSESOPHAGEAL ECHOCARDIOGRAM (TEE);  Surgeon: Jake Bathe, MD;  Location: [facility];  Service: Cardiovascular;  Laterality: N/A;  . THORACIC DISCECTOMY  02/16/2020   Procedure: Thoracic ten-eleven Discectomy;  Surgeon: Coletta Memos, MD;  Location: [facility] OR;  Service: Neurosurgery;;  . TONSILLECTOMY  2007  . TOTAL KNEE ARTHROPLASTY  01/27/2012   Procedure: TOTAL KNEE ARTHROPLASTY;  Surgeon: Nilda Simmer, MD;  Bilateral  . TOTAL KNEE ARTHROPLASTY  04/06/2012   Procedure: TOTAL KNEE ARTHROPLASTY;  Surgeon: Nilda Simmer, MD;  Location: [facility] OR;  Service: Orthopedics;  Laterality: Right;  . TUBAL LIGATION    . UTERINE FIBROID SURGERY     mid 200s  . WOUND EXPLORATION N/A 02/27/2020   Procedure: WOUND EXPLORATION;  Surgeon: Coletta Memos, MD;  Location: [facility] OR;  Service: Neurosurgery;  Laterality: N/A;,  (wound vac upper back)   Past Medical History:  Diagnosis Date  . Anemia   . Ankle syndesmosis disruption   . Anxiety    takes Ativan and Valium, after mother passed  . Arthritis    bilateral knees s/p knee replacement bilaterally  . Asthma   . Bronchitis   . Bruising    pt states unexplained d/t fibromyalgia  . Chronic back pain    2012 tailbone surgery and 3 lower discs.   . Closed fracture of distal end of right fibula and tibia   . Depression    from Fibromyalgia diagnosis; not taking medicine. since 2001  . Dizziness    rarely  . Fibromyalgia    diagnosed 2001  . GERD (gastroesophageal reflux disease)    Prilosec occasionally  . Headache(784.0)     "sinus headaches"  . History of hiatal hernia   . Hypertension    since 2013  . Hyperthyroidism    subclinical, no treatment; thyroid nodules  . IBS (irritable bowel syndrome)   . Impaired memory    states from fibromyalgia  . Insomnia    takes Ambien  . Jones fracture    left foot fifth metatarsal  . Multiple allergies    including latex, pet dander, shellfish, pet dander  . Painful orthopaedic hardware right ankle 05/27/2016  . Seasonal allergies   . Shortness of breath    Occasional with exertion;   . Sore gums    this is why pt is on Amoxil-only takes for dental work  . Tachycardia   . Thyroid goiter   . Varicose vein    protrudes above skin-per pt;vein popped and bruised;ultrasound done to make sure that there were no clots;noclots were found  Assessment/Plan  The repeat CT is revealing in as much as the pedicle screws at L2 are completely out of the pedicles at this point.  The hardware is not  loose.  I think the bone has moved around the hardware quite honestly, but nonetheless, she appears to have a solid arthrodesis through the cages, so my plan is simply to remove the upper hardware, the screws and then just leave her alone afterwards. L1-2 is degenerating more, but that would mean crossing the thoracolumbar junction right now and right now we just need to get in and get out.  She had the wound infection previously and still has a tunnel in the thoracic region.  So there is a fracture at the pedicle near the base of the vertebral body and I do believe all of this can heal, but the hardware is making it much harder to heal.  Therefore, removing the screws and rod I believe is the best idea.

## 2020-07-28 NOTE — Op Note (Signed)
07/28/2020  4:26 PM  PATIENT:  [name]  59 y.o. female With pedicle screws that have hollowed out the L2 pedicles. I believe she will have far less pain if I remove the screws.  PRE-OPERATIVE DIAGNOSIS:  Spinal stenosis, Lumbar region with neurogenic claudication  POST-OPERATIVE DIAGNOSIS:  Spinal stenosis, Lumbar region with neurogenic claudication  PROCEDURE:  Procedure(s): Removal of Lumbar Hardware  SURGEON: Surgeon(s): Coletta Memos, MD  ASSISTANTS:none  ANESTHESIA:   general  EBL:  Total I/O In: 1150 [I.V.:1000; Other:50; IV Piggyback:100] Out: 150 [Urine:100; Blood:50]  BLOOD ADMINISTERED:none  CELL SAVER GIVEN:none  COUNT:per nursing  DRAINS: none   SPECIMEN:  No Specimen  DICTATION: [name] was taken to the operating room, intubated, and placed prone on a Jackson table  without difficulty.  Her lumbar region was prepped and draped in a sterile manner. I opened the existing incision and dissected with the monopolar cautery through the soft tissue. I exposed the hardware and removed the L2 screws, and both rods. I was unable to remove the left connector, thus it was left in place. I irrigated and left self dissolving antibiotic beads impregnated with gentamicin, and vancomycin. I approximated the wound with vicryl sutures, closing the thoracolumbar fascia, subcutaneous, and subcuticular planes. I applied dermabond for a sterile dressing.  PLAN OF CARE: Admit for overnight observation  PATIENT DISPOSITION:  PACU - hemodynamically stable.   Delay start of Pharmacological VTE agent (>24hrs) due to surgical blood loss or risk of bleeding:  yes

## 2020-07-28 NOTE — Anesthesia Preprocedure Evaluation (Deleted)
Anesthesia Evaluation  Patient identified by MRN, date of birth, ID band Patient awake    Reviewed: Allergy & Precautions, NPO status , Patient's Chart, lab work & pertinent test results  Airway Mallampati: II  TM Distance: >3 FB Neck ROM: Full    Dental no notable dental hx.    Pulmonary neg pulmonary ROS, former smoker,    Pulmonary exam normal breath sounds clear to auscultation       Cardiovascular hypertension, Normal cardiovascular exam Rhythm:Regular Rate:Normal     Neuro/Psych negative neurological ROS  negative psych ROS   GI/Hepatic Neg liver ROS, GERD  Medicated,  Endo/Other  Hyperthyroidism   Renal/GU negative Renal ROS  negative genitourinary   Musculoskeletal negative musculoskeletal ROS (+)   Abdominal   Peds negative pediatric ROS (+)  Hematology negative hematology ROS (+)   Anesthesia Other Findings   Reproductive/Obstetrics negative OB ROS                             Anesthesia Physical Anesthesia Plan  ASA: II  Anesthesia Plan: General   Post-op Pain Management:    Induction: Intravenous  PONV Risk Score and Plan: 3 and Ondansetron, Dexamethasone, Midazolam and Treatment may vary due to age or medical condition  Airway Management Planned: Oral ETT  Additional Equipment:   Intra-op Plan:   Post-operative Plan: Extubation in OR  Informed Consent: I have reviewed the patients History and Physical, chart, labs and discussed the procedure including the risks, benefits and alternatives for the proposed anesthesia with the patient or authorized representative who has indicated his/her understanding and acceptance.     Dental advisory given  Plan Discussed with: CRNA and Surgeon  Anesthesia Plan Comments:         Anesthesia Quick Evaluation

## 2020-07-28 NOTE — Anesthesia Preprocedure Evaluation (Signed)
Anesthesia Evaluation  Patient identified by MRN, date of birth, ID band Patient awake    Reviewed: Allergy & Precautions, NPO status , Patient's Chart, lab work & pertinent test results  Airway Mallampati: II  TM Distance: >3 FB Neck ROM: Full    Dental   Pulmonary former smoker,    Pulmonary exam normal        Cardiovascular hypertension, Pt. on medications Normal cardiovascular exam     Neuro/Psych Anxiety Depression    GI/Hepatic GERD  Medicated and Controlled,  Endo/Other    Renal/GU      Musculoskeletal   Abdominal   Peds  Hematology   Anesthesia Other Findings   Reproductive/Obstetrics                             Anesthesia Physical Anesthesia Plan  ASA: II  Anesthesia Plan: General   Post-op Pain Management:    Induction: Intravenous  PONV Risk Score and Plan: 3 and Midazolam, Ondansetron and Treatment may vary due to age or medical condition  Airway Management Planned: Oral ETT  Additional Equipment:   Intra-op Plan:   Post-operative Plan: Extubation in OR  Informed Consent: I have reviewed the patients History and Physical, chart, labs and discussed the procedure including the risks, benefits and alternatives for the proposed anesthesia with the patient or authorized representative who has indicated his/her understanding and acceptance.       Plan Discussed with: CRNA and Surgeon  Anesthesia Plan Comments:         Anesthesia Quick Evaluation

## 2020-07-28 NOTE — Anesthesia Postprocedure Evaluation (Signed)
Anesthesia Post Note  Patient: [name]  Procedure(s) Performed: Removal of Lumbar Hardware (N/A Spine Lumbar)     Patient location during evaluation: PACU Anesthesia Type: General Level of consciousness: awake and alert Pain management: pain level controlled Vital Signs Assessment: post-procedure vital signs reviewed and stable Respiratory status: spontaneous breathing, nonlabored ventilation, respiratory function stable and patient connected to nasal cannula oxygen Cardiovascular status: blood pressure returned to baseline and stable Postop Assessment: no apparent nausea or vomiting Anesthetic complications: no   No complications documented.  Last Vitals:  Vitals:   07/28/20 1600 07/28/20 1615  BP: (!) 149/87 (!) 147/65  Pulse: 66 63  Resp: 16 18  Temp:    SpO2: 97% 96%    Last Pain:  Vitals:   07/28/20 1625  TempSrc:   PainSc: 8                  Kessie Croston DAVID

## 2020-07-28 NOTE — OR Nursing (Signed)
Wound vac attached to thoracic wound was removed. Wound was packed with 1/4" iodophor packing strip and bandaid

## 2020-07-28 NOTE — Anesthesia Procedure Notes (Signed)
Procedure Name: Intubation Date/Time: 07/28/2020 12:57 PM Performed by: Shirlyn Goltz, CRNA Pre-anesthesia Checklist: Patient identified, Emergency Drugs available, Suction available and Patient being monitored Patient Re-evaluated:Patient Re-evaluated prior to induction Oxygen Delivery Method: Circle system utilized Preoxygenation: Pre-oxygenation with 100% oxygen Induction Type: IV induction Ventilation: Mask ventilation without difficulty Laryngoscope Size: Mac and 4 Grade View: Grade I Tube size: 7.0 mm Number of attempts: 1 Airway Equipment and Method: Stylet Placement Confirmation: ETT inserted through vocal cords under direct vision,  positive ETCO2 and breath sounds checked- equal and bilateral Secured at: 22 cm Tube secured with: Tape Dental Injury: Teeth and Oropharynx as per pre-operative assessment

## 2020-07-28 NOTE — Transfer of Care (Signed)
Immediate Anesthesia Transfer of Care Note  Patient: [name]  Procedure(s) Performed: Lumbar hardware removal/revision (N/A )  Patient Location: PACU  Anesthesia Type:General  Level of Consciousness: awake, alert , oriented and patient cooperative  Airway & Oxygen Therapy: Patient Spontanous Breathing and Patient connected to nasal cannula oxygen  Post-op Assessment: Report given to RN and Post -op Vital signs reviewed and stable  Post vital signs: Reviewed and stable  Last Vitals:  Vitals Value Taken Time  BP 143/73 07/28/20 1533  Temp 36.2 C 07/28/20 1530  Pulse 71 07/28/20 1534  Resp 13 07/28/20 1534  SpO2 97 % 07/28/20 1534  Vitals shown include unvalidated device data.  Last Pain:  Vitals:   07/28/20 1056  TempSrc:   PainSc: 9          Complications: No complications documented.

## 2020-07-29 DIAGNOSIS — M48062 Spinal stenosis, lumbar region with neurogenic claudication: Secondary | ICD-10-CM | POA: Diagnosis not present

## 2020-07-29 MED ORDER — OXYCODONE-ACETAMINOPHEN 5-325 MG PO TABS
1.0000 | ORAL_TABLET | ORAL | Status: DC | PRN
  Administered 2020-07-29: 1 via ORAL
  Filled 2020-07-29: qty 1

## 2020-07-29 MED ORDER — OXYCODONE HCL 5 MG PO TABS
5.0000 mg | ORAL_TABLET | ORAL | Status: DC | PRN
  Administered 2020-07-29: 5 mg via ORAL
  Filled 2020-07-29: qty 1

## 2020-07-29 NOTE — Progress Notes (Signed)
Patient is discharged from room 3C02 at this time. Alert and in stable condition. IV site d/c'd and instructions read to patient with understanding verbalized and all questions answered. Left unit via wheelchair with all belongings at side.  

## 2020-07-29 NOTE — Discharge Summary (Signed)
BP 108/69 (BP Location: Right Arm)   Pulse 87   Temp 98 F (36.7 C) (Oral)   Resp 18   Ht 5\' 3"  (1.6 m)   Wt 90.7 kg   LMP 11/20/2012   SpO2 98%   BMI 35.43 kg/m  Physician Discharge Summary  Patient ID: [name] MRN: [name] DOB/AGE: Apr 11, 1961 59 y.o.  Admit date: 07/28/2020 Discharge date: 07/29/2020  Admission Diagnoses:hardware failure, lumbar spine L2  Discharge Diagnoses: same Active Problems:   Lumbago with sciatica, unspecified side   Discharged Condition: good  Hospital Course: [name] presented with pain in the lower extremities. She was taken to the operating room and had her pedicle screws at L2 removed. I also removed the rods and the connector on the right side. The connector on the left could not be removed and was left in place on the long rod. She is ambulating, voiding, and tolerating a regular diet. The wound is clean, dry, and without signs of infection at discharge.   Treatments: surgery: as above  Discharge Exam: Blood pressure 108/69, pulse 87, temperature 98 F (36.7 C), temperature source Oral, resp. rate 18, height 5\' 3"  (1.6 m), weight 90.7 kg, last menstrual period 11/20/2012, SpO2 98 %. General appearance: alert, cooperative, appears stated age and no distress  Disposition: Discharge disposition: 01-Home or Self Care      Spinal stenosis, Lumbar region with neurogenic claudication  Allergies as of 07/29/2020      Reactions   Monosodium Glutamate Anaphylaxis, Swelling   Eyes swollen shut, facial swelling, tongue swelling.   Shellfish Allergy Anaphylaxis   Celebrex [celecoxib] Itching   Only allergic to generic brand   Contrast Media [iodinated Diagnostic Agents] Itching, Nausea Only   "could not walk"   Diclofenac Itching   Can take the name brand Voltaren   Other Other (See Comments)   Pet dander, Mildew, Mold Cannot tolerate generic DICLOFENAC GEL--MUST USE BRAND NAME (GENERIC CAUSES ITCHING)   Latex Itching, Rash    Latex glove with powder      Medication List    TAKE these medications   acetaminophen 500 MG tablet Commonly known as: TYLENOL Take 1,500 mg by mouth every 8 (eight) hours as needed for moderate pain.   albuterol 108 (90 Base) MCG/ACT inhaler Commonly known as: VENTOLIN HFA Inhale 2 puffs into the lungs every 6 (six) hours as needed for wheezing or shortness of breath.   ascorbic acid 500 MG tablet Commonly known as: VITAMIN C Take 500 mg by mouth daily.   aspirin EC 81 MG tablet Take 81 mg by mouth daily.   atenolol 50 MG tablet Commonly known as: TENORMIN Take 1 tablet (50 mg total) by mouth daily.   CAL MAG ZINC +D3 PO Take 1 tablet by mouth in the morning, at noon, and at bedtime.   Calcium 600 + D 600-200 MG-UNIT Tabs Generic drug: Calcium Carb-Cholecalciferol Take 1 tablet by mouth in the morning and at bedtime.   CeleBREX 200 MG capsule Generic drug: celecoxib Take 1 capsule (200 mg total) by mouth 2 (two) times daily.   cephALEXin 500 MG capsule Commonly known as: KEFLEX Take 1 capsule (500 mg total) by mouth 4 (four) times daily.   chlorzoxazone 500 MG tablet Commonly known as: PARAFON Take 1 tablet (500 mg total) by mouth 3 (three) times daily.   cyclobenzaprine 10 MG tablet Commonly known as: FLEXERIL Take 10 mg by mouth 2 (two) times daily as needed for spasms.  diazepam 5 MG tablet Commonly known as: VALIUM Take 1 tablet (5 mg total) by mouth 2 (two) times daily.   diphenhydrAMINE 25 MG tablet Commonly known as: BENADRYL Take 50 mg by mouth in the morning, at noon, in the evening, and at bedtime.   Flector 1.3 % Ptch Generic drug: diclofenac Place 1 patch onto the skin 2 (two) times daily as needed (pain.).   furosemide 20 MG tablet Commonly known as: LASIX Take 2 tablets (40 mg total) by mouth daily.   gabapentin 600 MG tablet Commonly known as: NEURONTIN Take 2 tablets (1,200 mg total) by mouth 3 (three) times daily.    guaiFENesin 600 MG 12 hr tablet Commonly known as: MUCINEX Take 600 mg by mouth 2 (two) times daily as needed for cough.   hydrochlorothiazide 25 MG tablet Commonly known as: HYDRODIURIL Take 1 tablet (25 mg total) by mouth daily. One tablet by mouth in the morning.   hydrOXYzine 50 MG tablet Commonly known as: ATARAX/VISTARIL Take 50 mg by mouth 2 (two) times daily.   lubiprostone 24 MCG capsule Commonly known as: Amitiza Take 1 capsule (24 mcg total) by mouth 2 (two) times daily with a meal.   methocarbamol 500 MG tablet Commonly known as: ROBAXIN Take 1 tablet (500 mg total) by mouth 4 (four) times daily.   multivitamin with minerals Tabs tablet Take 1 tablet by mouth daily.   omeprazole 20 MG capsule Commonly known as: PRILOSEC Take 20 mg by mouth in the morning and at bedtime.   oxyCODONE-acetaminophen 10-325 MG tablet Commonly known as: PERCOCET Take 1 tablet by mouth every 6 (six) hours as needed for pain. What changed: when to take this   potassium chloride 10 MEQ tablet Commonly known as: KLOR-CON Take 1 tablet (10 mEq total) by mouth daily.   protein supplement shake Liqd Commonly known as: PREMIER PROTEIN Take 11 oz by mouth in the morning and at bedtime.   rifampin 300 MG capsule Commonly known as: RIFADIN Take 300 mg by mouth 2 (two) times daily.   senna 8.6 MG Tabs tablet Commonly known as: SENOKOT Take 1 tablet by mouth 2 (two) times daily as needed (constipation.).       Follow-up Information    Coletta Memos, MD Follow up in 3 week(s).   Specialty: Neurosurgery Why: please call to make an appointment Contact information: [address] [name] [address] [phone]               Signed: Coletta Memos 07/29/2020, 7:21 AM

## 2020-07-31 ENCOUNTER — Encounter (HOSPITAL_COMMUNITY): Payer: Self-pay | Admitting: Neurosurgery

## 2020-07-31 ENCOUNTER — Telehealth: Payer: Self-pay

## 2020-07-31 NOTE — Telephone Encounter (Signed)
Patient left voicemail on triage line stating she had back surgery last Friday for hardware removal. Would like to know if MD would like for her to continue antibiotics. Is not scheduled for follow up at this time. Will have front desk schedule appointment. Lorenso Courier, [place]

## 2020-08-02 ENCOUNTER — Telehealth (INDEPENDENT_AMBULATORY_CARE_PROVIDER_SITE_OTHER): Payer: Medicare Other | Admitting: Internal Medicine

## 2020-08-02 ENCOUNTER — Encounter: Payer: Self-pay | Admitting: Internal Medicine

## 2020-08-02 ENCOUNTER — Other Ambulatory Visit: Payer: Self-pay

## 2020-08-02 DIAGNOSIS — B9561 Methicillin susceptible Staphylococcus aureus infection as the cause of diseases classified elsewhere: Secondary | ICD-10-CM | POA: Diagnosis not present

## 2020-08-02 DIAGNOSIS — T8142XD Infection following a procedure, deep incisional surgical site, subsequent encounter: Secondary | ICD-10-CM

## 2020-08-02 DIAGNOSIS — Z792 Long term (current) use of antibiotics: Secondary | ICD-10-CM | POA: Diagnosis not present

## 2020-08-02 DIAGNOSIS — R7881 Bacteremia: Secondary | ICD-10-CM | POA: Diagnosis not present

## 2020-08-02 NOTE — Progress Notes (Signed)
Virtual Visit via Telephone Note  I connected with [name] on 08/02/20 at 11:30 AM EDT by telephone and verified that I am speaking with the correct person using two identifiers.  Location: Patient: at home Provider: in clinic   I discussed the limitations, risks, security and privacy concerns of performing an evaluation and management service by telephone and the availability of in person appointments. I also discussed with the patient that there may be a patient responsible charge related to this service. The patient expressed understanding and agreed to proceed.   History of Present Illness:  mssa hw infection - continues on cephalexin and rif -  Had HW removal in last week but appears still some of the HW was unable to be extracted. She does feel like she has less back pain since having HW removal by dr Franky Macho. In the past she had poor wound healing on superior aspect of original incision - has wound vac in place currently    Observations/Objective:  Feels better  Wound vac since in surgery Assessment and Plan: Need to get labs and seen physically  Nov 11 th at [facility] at 9:30 - is the next time in town  Follow Up Instructions: We would like to see her on nov 11th at 11:15am   I discussed the assessment and treatment plan with the patient. The patient was provided an opportunity to ask questions and all were answered. The patient agreed with the plan and demonstrated an understanding of the instructions.   The patient was advised to call back or seek an in-person evaluation if the symptoms worsen or if the condition fails to improve as anticipated.  I provided 15 minutes of non-face-to-face time during this encounter.   Judyann Munson, MD

## 2020-08-07 ENCOUNTER — Telehealth: Payer: Self-pay

## 2020-08-07 NOTE — Telephone Encounter (Signed)
Patient called requesting refills on Rifampin. Forwarding to provider to confirm she should continue taking with Keflex.   Omaria Plunk Loyola Mast, RN

## 2020-08-07 NOTE — Telephone Encounter (Signed)
Per Dr. Drue Second, okay to stop taking Rifampin and continue Keflex only. Called patient with instructions; reminded of appointment on 11/10.  Mary-Ann Pennella Loyola Mast, RN

## 2020-08-16 ENCOUNTER — Other Ambulatory Visit: Payer: Self-pay

## 2020-08-16 ENCOUNTER — Ambulatory Visit (INDEPENDENT_AMBULATORY_CARE_PROVIDER_SITE_OTHER): Payer: Medicare Other | Admitting: Internal Medicine

## 2020-08-16 VITALS — BP 107/72 | HR 79 | Temp 98.4°F | Wt 205.8 lb

## 2020-08-16 DIAGNOSIS — B9561 Methicillin susceptible Staphylococcus aureus infection as the cause of diseases classified elsewhere: Secondary | ICD-10-CM | POA: Diagnosis not present

## 2020-08-16 DIAGNOSIS — T8142XD Infection following a procedure, deep incisional surgical site, subsequent encounter: Secondary | ICD-10-CM

## 2020-08-16 DIAGNOSIS — R7881 Bacteremia: Secondary | ICD-10-CM | POA: Diagnosis not present

## 2020-08-16 MED ORDER — CEPHALEXIN 500 MG PO CAPS
500.0000 mg | ORAL_CAPSULE | Freq: Four times a day (QID) | ORAL | 5 refills | Status: DC
Start: 2020-08-16 — End: 2021-06-07

## 2020-08-16 NOTE — Progress Notes (Signed)
RFV: follow up on mssa HW infection s/p partial  Patient ID: [name], female   DOB: 12/17/1960, 59 y.o.   MRN: [id number]  HPI 59yo F with history spinal stenosis with lumbar fusion complicated by early MSSA infection of HW/SSI, she underwent debridement plus rule out of endocarditis. She was placed on 6 wk of cefazolin plus rifampin through 7/6 then transitioned to oral abtx of cephalexin plus rifampin to this day. Her incision still had difficulty with complete closure for which she had prolonged wound vac, and found to have vertebral fracture associated with HW placement. Dr Christella Noa decided to remove some of her [place] on 10/22. She states that her pain has improved. Still has smaller wound vac in place but overall feels improved.  Outpatient Encounter Medications as of 08/16/2020  Medication Sig  . acetaminophen (TYLENOL) 500 MG tablet Take 1,500 mg by mouth every 8 (eight) hours as needed for moderate pain.  Marland Kitchen albuterol (VENTOLIN HFA) 108 (90 Base) MCG/ACT inhaler Inhale 2 puffs into the lungs every 6 (six) hours as needed for wheezing or shortness of breath.  Marland Kitchen ascorbic acid (VITAMIN C) 500 MG tablet Take 500 mg by mouth daily.  Marland Kitchen aspirin EC 81 MG tablet Take 81 mg by mouth daily.  Marland Kitchen atenolol (TENORMIN) 50 MG tablet Take 1 tablet (50 mg total) by mouth daily.  . Calcium Carb-Cholecalciferol (CALCIUM 600 + D) 600-200 MG-UNIT TABS Take 1 tablet by mouth in the morning and at bedtime.  . CELEBREX 200 MG capsule Take 1 capsule (200 mg total) by mouth 2 (two) times daily.  . cephALEXin (KEFLEX) 500 MG capsule Take 1 capsule (500 mg total) by mouth 4 (four) times daily.  . chlorzoxazone (PARAFON) 500 MG tablet Take 1 tablet (500 mg total) by mouth 3 (three) times daily.  . cyclobenzaprine (FLEXERIL) 10 MG tablet Take 10 mg by mouth 2 (two) times daily as needed for spasms.  . diazepam (VALIUM) 5 MG tablet Take 1 tablet (5 mg total) by mouth 2 (two) times daily.  . diclofenac (FLECTOR)  1.3 % PTCH Place 1 patch onto the skin 2 (two) times daily as needed (pain.).  Marland Kitchen diphenhydrAMINE (BENADRYL) 25 MG tablet Take 50 mg by mouth in the morning, at noon, in the evening, and at bedtime.  . gabapentin (NEURONTIN) 600 MG tablet Take 2 tablets (1,200 mg total) by mouth 3 (three) times daily.  Marland Kitchen guaiFENesin (MUCINEX) 600 MG 12 hr tablet Take 600 mg by mouth 2 (two) times daily as needed for cough.  . hydrochlorothiazide (HYDRODIURIL) 25 MG tablet Take 1 tablet (25 mg total) by mouth daily. One tablet by mouth in the morning.  . hydrOXYzine (ATARAX/VISTARIL) 50 MG tablet Take 50 mg by mouth 2 (two) times daily.  Marland Kitchen lubiprostone (AMITIZA) 24 MCG capsule Take 1 capsule (24 mcg total) by mouth 2 (two) times daily with a meal.  . methocarbamol (ROBAXIN) 500 MG tablet Take 1 tablet (500 mg total) by mouth 4 (four) times daily.  . Multiple Minerals-Vitamins (CAL MAG ZINC +D3 PO) Take 1 tablet by mouth in the morning, at noon, and at bedtime.  . Multiple Vitamin (MULTIVITAMIN WITH MINERALS) TABS tablet Take 1 tablet by mouth daily.  Marland Kitchen omeprazole (PRILOSEC) 20 MG capsule Take 20 mg by mouth in the morning and at bedtime.   Marland Kitchen oxyCODONE-acetaminophen (PERCOCET) 10-325 MG tablet Take 1 tablet by mouth every 6 (six) hours as needed for pain. (Patient taking differently: Take 1 tablet by mouth  in the morning, at noon, in the evening, and at bedtime. )  . potassium chloride (KLOR-CON) 10 MEQ tablet Take 1 tablet (10 mEq total) by mouth daily.  . protein supplement shake (PREMIER PROTEIN) LIQD Take 11 oz by mouth in the morning and at bedtime.  . senna (SENOKOT) 8.6 MG TABS tablet Take 1 tablet by mouth 2 (two) times daily as needed (constipation.).   Marland Kitchen furosemide (LASIX) 20 MG tablet Take 2 tablets (40 mg total) by mouth daily. (Patient not taking: Reported on 07/18/2020)  . NARCAN 4 MG/0.1ML LIQD nasal spray kit SMARTSIG:1 Drop(s) Both Nares (Patient not taking: Reported on 08/16/2020)  . rifampin  (RIFADIN) 300 MG capsule Take 300 mg by mouth 2 (two) times daily. (Patient not taking: Reported on 08/16/2020)   No facility-administered encounter medications on file as of 08/16/2020.     Patient Active Problem List   Diagnosis Date Noted  . Lumbago with sciatica, unspecified side 07/28/2020  . PICC (peripherally inserted central catheter) in place 03/16/2020  . Encounter for long-term (current) use of antibiotics 03/16/2020  . Polypharmacy 03/09/2020  . At risk for adverse drug event 03/09/2020  . MSSA bacteremia 02/29/2020  . Postoperative infection 02/28/2020  . Abnormal gait due to muscle weakness 02/26/2020  . HNP (herniated nucleus pulposus), lumbar 02/16/2020  . Muscle weakness of lower extremity 02/14/2020  . Spondylolisthesis of lumbar region 10/30/2017  . Painful orthopaedic hardware right ankle 05/27/2016  . Closed fracture of distal end of right fibula and tibia   . Ankle syndesmosis disruption   . ASCUS favor benign 01/06/2014  . Leg swelling 12/22/2013  . Seasonal allergies   . Anemia   . Chronic back pain due to DJD with history surgery   . Hyperthyroidism   . 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 Status post left total prosthetic replacement of knee joint using cement 03/24/2012  . Fibromyalgia   . GERD (gastroesophageal reflux disease)   . Anxiety   . Depression   . Hypertension   . Lumbar degenerative disc disease 09/25/2011     Health Maintenance Due  Topic Date Due  . Hepatitis C Screening  Never done  . COVID-19 Vaccine (1) Never done  . COLONOSCOPY  Never done  . PAP SMEAR-Modifier  12/12/2018  . INFLUENZA VACCINE  Never done  . TETANUS/TDAP  05/08/2020     Review of Systems +back pain. No fever, chills, nightsweats. No lower extremity weakness but some deconditioning. 12 point ros is otherwise negative Physical Exam   BP 107/72   Pulse 79   Temp 98.4 F (36.9 C) (Oral)   Wt 205 lb 12.8 oz (93.4 kg)    LMP 02/18/2016   BMI 36.46 kg/m   Physical Exam  Constitutional:  oriented to person, place, and time. appears well-developed and well-nourished. No distress.  HENT: [place]/AT, PERRLA, no scleral icterus Mouth/Throat: Oropharynx is clear and moist. No oropharyngeal exudate.  Cardiovascular: Normal rate, regular rhythm and normal heart sounds. Exam reveals no gallop and no friction rub.  No murmur heard.  Pulmonary/Chest: Effort normal and breath sounds normal. No respiratory distress.  has no wheezes.  Back = small ulcer at base of incision. Other incision is well healed Neurological: alert and oriented to person, place, and time.  Skin: Skin is warm and dry. No rash noted. No erythema.  Psychiatric: a normal mood and affect.  behavior is normal.   Lab Results  Component Value Date   LABRPR  NON REAC 12/30/2013    CBC Lab Results  Component Value Date   WBC 12.4 (H) 07/28/2020   RBC 5.75 (H) 07/28/2020   HGB 13.3 07/28/2020   HCT 44.3 07/28/2020   PLT 321 07/28/2020   MCV 77.0 (L) 07/28/2020   MCH 23.1 (L) 07/28/2020   MCHC 30.0 07/28/2020   RDW 22.5 (H) 07/28/2020   LYMPHSABS 3.7 03/02/2020   MONOABS 1.3 (H) 03/02/2020   EOSABS 0.3 03/02/2020    BMET Lab Results  Component Value Date   NA 140 07/28/2020   K 3.4 (L) 07/28/2020   CL 104 07/28/2020   CO2 24 07/28/2020   GLUCOSE 89 07/28/2020   BUN 15 07/28/2020   CREATININE 0.61 07/28/2020   CALCIUM 9.9 07/28/2020   GFRNONAA >60 07/28/2020   GFRAA 90 03/22/2020    Lab Results  Component Value Date   ESRSEDRATE 25 08/16/2020   Lab Results  Component Value Date   CRP 8.7 (H) 08/16/2020     Assessment and Plan Hx of MSSA infection with HW involvement from lumbar fusion= Will check labs. Plan to continue on cephalexin only. Inflammatory markers nearly normalized. Would like to also see her CRP in normal range. Continue for additional 4 wk. Will see back to discuss when to discontinue abtx

## 2020-08-17 LAB — CBC WITH DIFFERENTIAL/PLATELET
Absolute Monocytes: 944 cells/uL (ref 200–950)
Basophils Absolute: 35 cells/uL (ref 0–200)
Basophils Relative: 0.3 %
Eosinophils Absolute: 236 cells/uL (ref 15–500)
Eosinophils Relative: 2 %
HCT: 40.7 % (ref 35.0–45.0)
Hemoglobin: 13.2 g/dL (ref 11.7–15.5)
Lymphs Abs: 4425 cells/uL — ABNORMAL HIGH (ref 850–3900)
MCH: 24.1 pg — ABNORMAL LOW (ref 27.0–33.0)
MCHC: 32.4 g/dL (ref 32.0–36.0)
MCV: 74.3 fL — ABNORMAL LOW (ref 80.0–100.0)
MPV: 10.8 fL (ref 7.5–12.5)
Monocytes Relative: 8 %
Neutro Abs: 6160 cells/uL (ref 1500–7800)
Neutrophils Relative %: 52.2 %
Platelets: 386 10*3/uL (ref 140–400)
RBC: 5.48 10*6/uL — ABNORMAL HIGH (ref 3.80–5.10)
RDW: 20.9 % — ABNORMAL HIGH (ref 11.0–15.0)
Total Lymphocyte: 37.5 %
WBC: 11.8 10*3/uL — ABNORMAL HIGH (ref 3.8–10.8)

## 2020-08-17 LAB — BASIC METABOLIC PANEL
BUN/Creatinine Ratio: 46 (calc) — ABNORMAL HIGH (ref 6–22)
BUN: 31 mg/dL — ABNORMAL HIGH (ref 7–25)
CO2: 24 mmol/L (ref 20–32)
Calcium: 10.1 mg/dL (ref 8.6–10.4)
Chloride: 103 mmol/L (ref 98–110)
Creat: 0.68 mg/dL (ref 0.50–1.05)
Glucose, Bld: 108 mg/dL — ABNORMAL HIGH (ref 65–99)
Potassium: 4.3 mmol/L (ref 3.5–5.3)
Sodium: 137 mmol/L (ref 135–146)

## 2020-08-17 LAB — SEDIMENTATION RATE: Sed Rate: 25 mm/h (ref 0–30)

## 2020-08-17 LAB — C-REACTIVE PROTEIN: CRP: 8.7 mg/L — ABNORMAL HIGH (ref <8.0)

## 2020-10-16 ENCOUNTER — Telehealth: Payer: Self-pay

## 2020-10-16 ENCOUNTER — Telehealth (INDEPENDENT_AMBULATORY_CARE_PROVIDER_SITE_OTHER): Payer: Medicare Other | Admitting: Internal Medicine

## 2020-10-16 ENCOUNTER — Other Ambulatory Visit: Payer: Self-pay

## 2020-10-16 DIAGNOSIS — R7881 Bacteremia: Secondary | ICD-10-CM | POA: Diagnosis not present

## 2020-10-16 DIAGNOSIS — B9561 Methicillin susceptible Staphylococcus aureus infection as the cause of diseases classified elsewhere: Secondary | ICD-10-CM

## 2020-10-16 DIAGNOSIS — T8142XD Infection following a procedure, deep incisional surgical site, subsequent encounter: Secondary | ICD-10-CM | POA: Diagnosis not present

## 2020-10-16 DIAGNOSIS — Z792 Long term (current) use of antibiotics: Secondary | ICD-10-CM

## 2020-10-16 NOTE — Progress Notes (Signed)
Virtual Visit via Telephone Note  I connected with [name] on 10/16/20 at 11:00 AM EST by telephone and verified that I am speaking with the correct person using two identifiers.  Location: Patient: at home Provider: clinic   I discussed the limitations, risks, security and privacy concerns of performing an evaluation and management service by telephone and the availability of in person appointments. I also discussed with the patient that there may be a patient responsible charge related to this service. The patient expressed understanding and agreed to proceed.  Rfv: mssa hw infection History of Present Illness:   she reports that she is still having back pain. Her incision wound healing is Nearly closed.she is still on oral abtx and tolerating without n/v/diarrhea. Of late she has noticed having Allergic sinusitis  ROS: in addition to what is mentioned in hpi, she also havaing hair loss  Observations/Objective: She has fluent speech on conversation  Assessment and Plan:  want to continue the abtx until wound healing. Refill abtx cephalexin  Follow Up Instructions:    rtc in 6-7 wks.    I discussed the assessment and treatment plan with the patient. The patient was provided an opportunity to ask questions and all were answered. The patient agreed with the plan and demonstrated an understanding of the instructions.   The patient was advised to call back or seek an in-person evaluation if the symptoms worsen or if the condition fails to improve as anticipated.  I provided 10 minutes of non-face-to-face time during this encounter.   Judyann Munson, MD

## 2020-10-16 NOTE — Telephone Encounter (Signed)
Patient called to report she has sinus drainage, cough, and sore throat. Has not recently been tested for COVID. Changed today's office visit to a virtual visit with Dr. Drue Second.   Sandie Ano, RN

## 2020-10-18 ENCOUNTER — Other Ambulatory Visit: Payer: Self-pay | Admitting: Neurosurgery

## 2020-10-18 DIAGNOSIS — M48062 Spinal stenosis, lumbar region with neurogenic claudication: Secondary | ICD-10-CM

## 2020-10-30 ENCOUNTER — Ambulatory Visit
Admission: RE | Admit: 2020-10-30 | Discharge: 2020-10-30 | Disposition: A | Discharge status: Home / Self Care | Payer: Medicare Other | Source: Ambulatory Visit | Attending: Neurosurgery | Admitting: Neurosurgery

## 2020-10-30 DIAGNOSIS — M48062 Spinal stenosis, lumbar region with neurogenic claudication: Secondary | ICD-10-CM

## 2020-11-01 ENCOUNTER — Other Ambulatory Visit: Payer: Self-pay | Admitting: Neurosurgery

## 2020-11-01 DIAGNOSIS — M4712 Other spondylosis with myelopathy, cervical region: Secondary | ICD-10-CM

## 2020-11-19 ENCOUNTER — Ambulatory Visit
Admission: RE | Admit: 2020-11-19 | Discharge: 2020-11-19 | Disposition: A | Discharge status: Home / Self Care | Payer: Medicare Other | Source: Ambulatory Visit | Attending: Neurosurgery | Admitting: Neurosurgery

## 2020-11-19 ENCOUNTER — Other Ambulatory Visit: Payer: Self-pay

## 2020-11-19 DIAGNOSIS — M4712 Other spondylosis with myelopathy, cervical region: Secondary | ICD-10-CM

## 2020-12-04 ENCOUNTER — Telehealth (INDEPENDENT_AMBULATORY_CARE_PROVIDER_SITE_OTHER): Payer: Medicare Other | Admitting: Internal Medicine

## 2020-12-04 ENCOUNTER — Other Ambulatory Visit: Payer: Self-pay

## 2020-12-04 ENCOUNTER — Encounter: Payer: Self-pay | Admitting: Internal Medicine

## 2020-12-04 DIAGNOSIS — Z792 Long term (current) use of antibiotics: Secondary | ICD-10-CM

## 2020-12-04 DIAGNOSIS — B9561 Methicillin susceptible Staphylococcus aureus infection as the cause of diseases classified elsewhere: Secondary | ICD-10-CM | POA: Diagnosis not present

## 2020-12-04 DIAGNOSIS — R7881 Bacteremia: Secondary | ICD-10-CM

## 2020-12-04 DIAGNOSIS — T8142XD Infection following a procedure, deep incisional surgical site, subsequent encounter: Secondary | ICD-10-CM | POA: Diagnosis not present

## 2020-12-04 NOTE — Progress Notes (Signed)
Virtual Visit via Video Note  I connected with [name] on 12/04/20 at 10:45 AM EST by a video enabled telemedicine application and verified that I am speaking with the correct person using two identifiers.  Location: Patient: at home Provider: in clinic   I discussed the limitations of evaluation and management by telemedicine and the availability of in person appointments. The patient expressed understanding and agreed to proceed.  History of Present Illness: [name] is a 60yo F with MSSA HW infection s/p revision/partial HW removal, s/p I x D x 2 now on chronic cephalexin. She states wound are now closed however she has some radicular pain to hands/arms for whic she is undergoing another surgery for ? Cervical decompression. No fever, chills, nightsweats. Tolerating abtx, no fungal skin infection.  ROS: she reports still having patchy hairloss and also noticing some hyperpigmentation to her eyes and cheeks that are somewhat new in the last 3-4 months  Lab Results  Component Value Date   ESRSEDRATE 25 08/16/2020   Lab Results  Component Value Date   CRP 8.7 (H) 08/16/2020       Observations/Objective: Speech is fluent, fatigue appearing Not in any distress  Assessment and Plan:  - continue with keflex for 4 week - has cervical laminectomy/fusion on Friday - alopecia = can do topical minoxidil - hyperpigmentation = recommended OTC vitamin C  Looking for NP as her PCP since her insurance forcing change  Follow Up Instructions: Continue on oral abtx until we see back in 6 wks   I discussed the assessment and treatment plan with the patient. The patient was provided an opportunity to ask questions and all were answered. The patient agreed with the plan and demonstrated an understanding of the instructions.   The patient was advised to call back or seek an in-person evaluation if the symptoms worsen or if the condition fails to improve as anticipated.  I provided 10  minutes of non-face-to-face time during this encounter.   Judyann Munson, MD

## 2020-12-06 ENCOUNTER — Other Ambulatory Visit (HOSPITAL_COMMUNITY): Payer: Medicare Other

## 2020-12-06 ENCOUNTER — Encounter (HOSPITAL_COMMUNITY)
Admission: RE | Admit: 2020-12-06 | Discharge: 2020-12-06 | Disposition: A | Discharge status: Home / Self Care | Payer: Medicare Other | Source: Ambulatory Visit | Attending: Neurosurgery | Admitting: Neurosurgery

## 2020-12-06 ENCOUNTER — Other Ambulatory Visit: Payer: Self-pay

## 2020-12-06 ENCOUNTER — Encounter (HOSPITAL_COMMUNITY): Payer: Self-pay

## 2020-12-06 DIAGNOSIS — G8929 Other chronic pain: Secondary | ICD-10-CM | POA: Insufficient documentation

## 2020-12-06 DIAGNOSIS — M549 Dorsalgia, unspecified: Secondary | ICD-10-CM | POA: Insufficient documentation

## 2020-12-06 DIAGNOSIS — Z6839 Body mass index (BMI) 39.0-39.9, adult: Secondary | ICD-10-CM | POA: Diagnosis not present

## 2020-12-06 DIAGNOSIS — Z01812 Encounter for preprocedural laboratory examination: Secondary | ICD-10-CM | POA: Insufficient documentation

## 2020-12-06 DIAGNOSIS — E0590 Thyrotoxicosis, unspecified without thyrotoxic crisis or storm: Secondary | ICD-10-CM | POA: Diagnosis not present

## 2020-12-06 DIAGNOSIS — I10 Essential (primary) hypertension: Secondary | ICD-10-CM | POA: Insufficient documentation

## 2020-12-06 DIAGNOSIS — J45909 Unspecified asthma, uncomplicated: Secondary | ICD-10-CM | POA: Insufficient documentation

## 2020-12-06 DIAGNOSIS — D649 Anemia, unspecified: Secondary | ICD-10-CM | POA: Diagnosis not present

## 2020-12-06 DIAGNOSIS — F419 Anxiety disorder, unspecified: Secondary | ICD-10-CM | POA: Insufficient documentation

## 2020-12-06 DIAGNOSIS — K219 Gastro-esophageal reflux disease without esophagitis: Secondary | ICD-10-CM | POA: Diagnosis not present

## 2020-12-06 DIAGNOSIS — M4712 Other spondylosis with myelopathy, cervical region: Secondary | ICD-10-CM | POA: Diagnosis not present

## 2020-12-06 DIAGNOSIS — Z87891 Personal history of nicotine dependence: Secondary | ICD-10-CM | POA: Insufficient documentation

## 2020-12-06 DIAGNOSIS — M797 Fibromyalgia: Secondary | ICD-10-CM | POA: Insufficient documentation

## 2020-12-06 DIAGNOSIS — F32A Depression, unspecified: Secondary | ICD-10-CM | POA: Diagnosis not present

## 2020-12-06 DIAGNOSIS — E669 Obesity, unspecified: Secondary | ICD-10-CM | POA: Insufficient documentation

## 2020-12-06 DIAGNOSIS — Z20822 Contact with and (suspected) exposure to covid-19: Secondary | ICD-10-CM | POA: Insufficient documentation

## 2020-12-06 LAB — CBC
HCT: 40.1 % (ref 36.0–46.0)
Hemoglobin: 13.1 g/dL (ref 12.0–15.0)
MCH: 26.6 pg (ref 26.0–34.0)
MCHC: 32.7 g/dL (ref 30.0–36.0)
MCV: 81.5 fL (ref 80.0–100.0)
Platelets: 303 10*3/uL (ref 150–400)
RBC: 4.92 MIL/uL (ref 3.87–5.11)
RDW: 15.5 % (ref 11.5–15.5)
WBC: 11.7 10*3/uL — ABNORMAL HIGH (ref 4.0–10.5)
nRBC: 0 % (ref 0.0–0.2)

## 2020-12-06 LAB — BASIC METABOLIC PANEL
Anion gap: 13 (ref 5–15)
BUN: 27 mg/dL — ABNORMAL HIGH (ref 6–20)
CO2: 23 mmol/L (ref 22–32)
Calcium: 9.9 mg/dL (ref 8.9–10.3)
Chloride: 103 mmol/L (ref 98–111)
Creatinine, Ser: 0.64 mg/dL (ref 0.44–1.00)
GFR, Estimated: >60 mL/min (ref >60)
Glucose, Bld: 104 mg/dL — ABNORMAL HIGH (ref 70–99)
Potassium: 3.5 mmol/L (ref 3.5–5.1)
Sodium: 139 mmol/L (ref 135–145)

## 2020-12-06 LAB — SARS CORONAVIRUS 2 (TAT 6-24 HRS): SARS Coronavirus 2: NEGATIVE

## 2020-12-06 LAB — SURGICAL PCR SCREEN
MRSA, PCR: NEGATIVE
Staphylococcus aureus: NEGATIVE

## 2020-12-06 NOTE — Progress Notes (Signed)
PCP - Lupe Carney Cardiologist - denies Dr. Drue Second with infectious disease.   PPM/ICD - denies   Chest x-ray - n/a EKG - 02/08/20 Stress Test - over 10 years ago. Normal (cant remember the MD) ECHO - 03/01/20 Cardiac Cath - denies  Sleep Study - denies   Patient instructed to hold all Aspirin, NSAID's, herbal medications, fish oil and vitamins 7 days prior to surgery.   ERAS Protcol -no   COVID TEST- 3/2 done at pre op appt   Anesthesia review: yes  Patient denies shortness of breath, fever, cough and chest pain at PAT appointment   All instructions explained to the patient, with a verbal understanding of the material. Patient agrees to go over the instructions while at home for a better understanding. Patient also instructed to self quarantine after being tested for COVID-19. The opportunity to ask questions was provided.

## 2020-12-06 NOTE — Progress Notes (Signed)
Surgical Instructions    Your procedure is scheduled on 12/08/20.  Report to [facility] Main Entrance "A" at 11:45 A.M., then check in with the Admitting office.  Call this number if you have problems the morning of surgery:  [phone]   If you have any questions prior to your surgery date call [phone]: Open Monday-Friday 8am-4pm    Remember:  Do not eat or drink after midnight the night before your surgery     Take these medicines the morning of surgery with A SIP OF WATER  acetaminophen (TYLENOL) as needed albuterol (VENTOLIN HFA) as needed- bring inhaler with you the day of surgery atenolol (TENORMIN)  cephALEXin (KEFLEX)  chlorzoxazone (PARAFON) cyclobenzaprine (FLEXERIL) as needed diazepam (VALIUM)  gabapentin (NEURONTIN)  hydrOXYzine (ATARAX/VISTARIL) methocarbamol (ROBAXIN) omeprazole (PRILOSEC)  oxyCODONE-acetaminophen (PERCOCET)   As of today, STOP taking any Aspirin (unless otherwise instructed by your surgeon) Aleve, Naproxen, Ibuprofen, Motrin, Advil, Goody's, BC's, all herbal medications, fish oil, and all vitamins.                     Do not wear jewelry, make up, or nail polish            Do not wear lotions, powders, perfumes/colognes, or deodorant.            Do not shave 48 hours prior to surgery.              Do not bring valuables to the hospital.            [employer] is not responsible for any belongings or valuables.  Do NOT Smoke (Tobacco/Vaping) or drink Alcohol 24 hours prior to your procedure If you use a CPAP at night, you may bring all equipment for your overnight stay.   Contacts, glasses, dentures or bridgework may not be worn into surgery, please bring cases for these belongings   For patients admitted to the hospital, discharge time will be determined by your treatment team.   Patients discharged the day of surgery will not be allowed to drive home, and someone needs to stay with them for 24 hours.    Special instructions:    [place]- Preparing For Surgery  Before surgery, you can play an important role. Because skin is not sterile, your skin needs to be as free of germs as possible. You can reduce the number of germs on your skin by washing with CHG (chlorahexidine gluconate) Soap before surgery.  CHG is an antiseptic cleaner which kills germs and bonds with the skin to continue killing germs even after washing.    Oral Hygiene is also important to reduce your risk of infection.  Remember - BRUSH YOUR TEETH THE MORNING OF SURGERY WITH YOUR REGULAR TOOTHPASTE  Please do not use if you have an allergy to CHG or antibacterial soaps. If your skin becomes reddened/irritated stop using the CHG.  Do not shave (including legs and underarms) for at least 48 hours prior to first CHG shower. It is OK to shave your face.  Please follow these instructions carefully.   1. Shower the NIGHT BEFORE SURGERY and the MORNING OF SURGERY  2. If you chose to wash your hair, wash your hair first as usual with your normal shampoo.  3. After you shampoo, rinse your hair and body thoroughly to remove the shampoo.  4. Wash Face and genitals (private parts) with your normal soap.   5.  Shower the NIGHT BEFORE SURGERY and the MORNING OF  SURGERY with CHG Soap.   6. Use CHG Soap as you would any other liquid soap. You can apply CHG directly to the skin and wash gently with a scrungie or a clean washcloth.   7. Apply the CHG Soap to your body ONLY FROM THE NECK DOWN.  Do not use on open wounds or open sores. Avoid contact with your eyes, ears, mouth and genitals (private parts). Wash Face and genitals (private parts)  with your normal soap.   8. Wash thoroughly, paying special attention to the area where your surgery will be performed.  9. Thoroughly rinse your body with warm water from the neck down.  10. DO NOT shower/wash with your normal soap after using and rinsing off the CHG Soap.  11. Pat yourself dry with a CLEAN  TOWEL.  12. Wear CLEAN PAJAMAS to bed the night before surgery  13. Place CLEAN SHEETS on your bed the night before your surgery  14. DO NOT SLEEP WITH PETS.   Day of Surgery: Take a shower.  Wear Clean/Comfortable clothing the morning of surgery Do not apply any deodorants/lotions.   Remember to brush your teeth WITH YOUR REGULAR TOOTHPASTE.   Please read over the following fact sheets that you were given.

## 2020-12-07 NOTE — Progress Notes (Signed)
Anesthesia Chart Review:  Case: [id number] Date/Time: 12/08/20 1335   Procedure: Cervical 4-5 Anterior cervical decompression/discectomy/fusion (N/A ) - 3C/RM 18   Anesthesia type: General   Pre-op diagnosis: Other spondylosis with myelopathy, cervical region   Location: [facility] OR ROOM 28 / [place] [facility]   Surgeons: Ashok Pall, MD      DISCUSSION: Patient is a 60 year old female scheduled for the above procedure.  History includes former smoker (quit 12/06/98), HTN, fibromyalgia (reported associated impaired memory and bruising), tachycardia, anemia, hiatal hernia, GERD, IBS, subclinical hyperthyroidism (with goiter/thyroid nodules), asthma (occasional DOE), anxiety, depression, varicose veins, multiple allergies, chronic back pain, hysterectomy, TKR (left 01/27/12; right 01/06/31 complicated by partial wound dehiscence after fall, s/p I&D 04/09/12), back surgery (L5-S1 ALIF 09/20/11; L4-5 PLIF 10/30/17; L3-4 PLIF 07/16/19; L2-3 PLIF 4/40/10 complicated by MSSA bacteremia/wound infection, s/p I&D 02/27/20 with wound VAC, s/p PICC live for IV cefazolin through 04/11/20 & oral rifampin through 08/16/20, currently on chronic Keflex per ID Dr. Baxter Flattery; s/p removal of lumbar hardware L2 pedicle screws 07/28/20), neck surgery (C6-7 ACDF 02/18/03). BMI is consistent with obesity.   12/06/20 presurgical COVID-19 test negative. Anesthesia team to evaluate on the day of surgery.    VS: BP 121/63   Pulse 83   Temp 37.1 C (Oral)   Resp 19   Ht _0  (1.6 m)   Wt 101.5 kg   LMP 11/20/2012   SpO2 96%   BMI 39.64 kg/m     PROVIDERS: Alroy Dust, L.Marlou Sa, MD is PCP  Carlyle Basques, MD is ID. Last visit 12/04/20. Continue Keflex for now and follow-up in six weeks after surgery.    LABS: Labs reviewed: Acceptable for surgery. TSH 0.47 and Free T4 0.79 on 07/02/19 ([place]). (all labs ordered are listed, but only abnormal results are displayed)  Labs Reviewed  BASIC METABOLIC PANEL - Abnormal; Notable for the  following components:      Result Value   Glucose, Bld 104 (*)    BUN 27 (*)    All other components within normal limits  CBC - Abnormal; Notable for the following components:   WBC 11.7 (*)    All other components within normal limits  SURGICAL PCR SCREEN  SARS CORONAVIRUS 2 (TAT 6-24 HRS)     IMAGES: MRI C-spine 11/19/20: IMPRESSION: - Comparison is made to the prior cervical spine MRI of 03/10/2019. Cervical spondylosis and postoperative changes, as outlined and with findings most notably as follows. - At C4-C5, there is progressive severe disc degeneration. Progressive prominent posterior disc osteophyte complex. This contributes to multifactorial now severe spinal canal stenosis with mild flattening of the ventral spinal cord. Subtle T2 hyperintense signal abnormality is questioned within the left aspect of the spinal cord, and this may reflect myelomalacia or focal edema. Progressive bilateral neural foraminal narrowing (moderate/severe right, severe left). - Interval C3-C4 ACDF. No significant spinal canal stenosis remains at this level. Vertebral osteophytosis and posterior element hypertrophy contribute to residual mild/moderate left neural foraminal narrowing, improved. - At C7-T1, persistent grade 1 anterolisthesis. Disc uncovering with disc bulge and endplate spurring. A superimposed central disc protrusion has significantly regressed and is now small. This contributes to multifactorial minimal relative spinal canal narrowing, improved. Moderate/severe bilateral neural foraminal narrowing, unchanged. - No significant spinal canal stenosis at the remaining levels. Additional sites of neural foraminal narrowing as described and greatest on the right at C5-C6 (moderate/severe). - Prior C6-C7 ACDF. - Redemonstrated foci of myelomalacia within the spinal cord at the  C3-C4 level and C7-T1 level.   EKG: 02/26/20: Sinus rhythm Artifact No significant change since  last tracing Confirmed by Blanchie Dessert [phone]) on 02/27/2020 9:22:06 PM   CV:  TEE 03/01/20: IMPRESSIONS  1. Left ventricular ejection fraction, by estimation, is 60 to 65%. The  left ventricle has normal function. The left ventricle has no regional  wall motion abnormalities.  2. Right ventricular systolic function is normal. The right ventricular  size is normal.  3. No left atrial/left atrial appendage thrombus was detected.  4. The mitral valve is normal in structure. Mild mitral valve  regurgitation. No evidence of mitral stenosis.  5. The aortic valve is normal in structure. Aortic valve regurgitation is  not visualized. No aortic stenosis is present.  6. The inferior vena cava is normal in size with greater than 50%  respiratory variability, suggesting right atrial pressure of 3 mmHg.  - Conclusion(s)/Recommendation(s): No evidence of vegetation/infective  endocarditis on this transesophageal  echocardiogram.   TTE 02/29/20: IMPRESSIONS  1. Left ventricular ejection fraction, by estimation, is 55 to 60%. The  left ventricle has normal function. The left ventricle has no regional  wall motion abnormalities. There is mild left ventricular hypertrophy.  Left ventricular diastolic parameters  are consistent with Grade I diastolic dysfunction (impaired relaxation).  2. Right ventricular systolic function is normal. The right ventricular  size is normal. Tricuspid regurgitation signal is inadequate for assessing  PA pressure.  3. Left atrial size was mildly dilated.  4. The mitral valve is normal in structure. No evidence of mitral valve  regurgitation. No evidence of mitral stenosis.  5. The aortic valve is tricuspid. Aortic valve regurgitation is not  visualized. Mild aortic valve sclerosis is present, with no evidence of  aortic valve stenosis.  6. The inferior vena cava is normal in size with greater than 50%  respiratory variability, suggesting right atrial  pressure of 3 mmHg.   Reported remote history of a normal stress test > 10 years ago.    Past Medical History:  Diagnosis Date  . Anemia   . Ankle syndesmosis disruption   . Anxiety    takes Ativan and Valium, after mother passed  . Arthritis    bilateral knees s/p knee replacement bilaterally  . Asthma   . Bronchitis   . Bruising    pt states unexplained d/t fibromyalgia  . Chronic back pain    2012 tailbone surgery and 3 lower discs.   . Closed fracture of distal end of right fibula and tibia   . Depression    from Fibromyalgia diagnosis; not taking medicine. since 2001  . Dizziness    rarely  . Fibromyalgia    diagnosed 2001  . GERD (gastroesophageal reflux disease)    Prilosec occasionally  . Headache(784.0)    "sinus headaches"  . History of hiatal hernia   . Hypertension    since 2013  . Hyperthyroidism    subclinical, no treatment; thyroid nodules  . IBS (irritable bowel syndrome)   . Impaired memory    states from fibromyalgia  . Insomnia    takes Ambien  . Jones fracture    left foot fifth metatarsal  . Multiple allergies    including latex, pet dander, shellfish, pet dander  . Painful orthopaedic hardware right ankle 05/27/2016  . Seasonal allergies   . Shortness of breath    Occasional with exertion;   . Sore gums    this is why pt is on Amoxil-only takes for  dental work  . Tachycardia   . Thyroid goiter   . Varicose vein    protrudes above skin-per pt;vein popped and bruised;ultrasound done to make sure that there were no clots;noclots were found    Past Surgical History:  Procedure Laterality Date  . ANTERIOR LUMBAR FUSION  09/20/2011   Procedure: ANTERIOR LUMBAR FUSION 1 LEVEL;  Surgeon: Winfield Cunas;  Location: [place];  Service: Neurosurgery;  Laterality: N/A;  Lumbar five-Sacral One Anterior Lumbar Interbody Fusion /Dr. Early to Approach   . CERVICAL DISC SURGERY     WITH TITANIUM PLATE IN NECK---LEFT SIDE  . CERVICAL SPINE SURGERY   03/24/2019  . CESAREAN SECTION    . fibroidectomy    . FRACTURE SURGERY  05/08/2010   Jones fracture left foot fifth metatarsal  . HARDWARE REMOVAL Right 05/28/2016   Procedure: RIGHT ANKLE HARDWARE REMOVAL;  Surgeon: Elsie Saas, MD;  Location: [place];  Service: Orthopedics;  Laterality: Right;  . HARDWARE REMOVAL N/A 07/28/2020   Procedure: Removal of Lumbar Hardware;  Surgeon: Ashok Pall, MD;  Location: [place];  Service: Neurosurgery;  Laterality: N/A;  . HERNIA REPAIR     hiatal hernia  . IRRIGATION AND DEBRIDEMENT KNEE  04/09/2012   Procedure: IRRIGATION AND DEBRIDEMENT KNEE;  Surgeon: Yvette Rack., MD;  Location: [place];  Service: Orthopedics;  Laterality: Right;  . JOINT REPLACEMENT  01-27-2012   left total knee and Right total knee  . KNEE SURGERY  2005   Left knee arthroscopy  . NASAL SEPTOPLASTY W/ TURBINOPLASTY  2007   due to recurrent sinusitis  . ORIF ANKLE FRACTURE Right 06/21/2015   Procedure: OPEN REDUCTION INTERNAL FIXATION RIGHT DISTAL FIBULA  FRACTURE AND OPEN REDUCTION INTERNAL FIXATION SYNDESMOSIS ;  Surgeon: Elsie Saas, MD;  Location: [place];  Service: Orthopedics;  Laterality: Right;  . SHOULDER ARTHROSCOPY W/ ROTATOR CUFF REPAIR Right   . SHOULDER SURGERY Left   . SPINE SURGERY  2004   Cervical plate, ACDF  . STERIOD INJECTION  01/27/2012   Procedure: STEROID INJECTION;  Surgeon: Lorn Junes, MD;  Location: [place];  Service: Orthopedics;  Laterality: Right;  . TEE WITHOUT CARDIOVERSION N/A 03/01/2020   Procedure: TRANSESOPHAGEAL ECHOCARDIOGRAM (TEE);  Surgeon: Jerline Pain, MD;  Location: [facility]OSCOPY;  Service: Cardiovascular;  Laterality: N/A;  . THORACIC DISCECTOMY  02/16/2020   Procedure: Thoracic ten-eleven Discectomy;  Surgeon: Ashok Pall, MD;  Location: [place];  Service: Neurosurgery;;  . TONSILLECTOMY  2007  . TOTAL KNEE ARTHROPLASTY  01/27/2012   Procedure: TOTAL KNEE ARTHROPLASTY;  Surgeon: Lorn Junes,  MD;  Bilateral  . TOTAL KNEE ARTHROPLASTY  04/06/2012   Procedure: TOTAL KNEE ARTHROPLASTY;  Surgeon: Lorn Junes, MD;  Location: [place];  Service: Orthopedics;  Laterality: Right;  . TUBAL LIGATION    . UTERINE FIBROID SURGERY     mid 200s  . WOUND EXPLORATION N/A 02/27/2020   Procedure: WOUND EXPLORATION;  Surgeon: Ashok Pall, MD;  Location: [place];  Service: Neurosurgery;  Laterality: N/A;,  (wound vac upper back)    MEDICATIONS: . acetaminophen (TYLENOL) 500 MG tablet  . albuterol (VENTOLIN HFA) 108 (90 Base) MCG/ACT inhaler  . ascorbic acid (VITAMIN C) 500 MG tablet  . aspirin EC 81 MG tablet  . atenolol (TENORMIN) 50 MG tablet  . CELEBREX 200 MG capsule  . cephALEXin (KEFLEX) 500 MG capsule  . chlorzoxazone (PARAFON) 500 MG tablet  . cyclobenzaprine (FLEXERIL) 10  MG tablet  . diazepam (VALIUM) 5 MG tablet  . diclofenac (FLECTOR) 1.3 % PTCH  . diphenhydrAMINE (BENADRYL) 25 MG tablet  . furosemide (LASIX) 20 MG tablet  . gabapentin (NEURONTIN) 600 MG tablet  . guaiFENesin (MUCINEX) 600 MG 12 hr tablet  . hydrochlorothiazide (HYDRODIURIL) 25 MG tablet  . hydrOXYzine (ATARAX/VISTARIL) 50 MG tablet  . lubiprostone (AMITIZA) 24 MCG capsule  . methocarbamol (ROBAXIN) 500 MG tablet  . Multiple Minerals-Vitamins (CAL MAG ZINC +D3 PO)  . Multiple Vitamin (MULTIVITAMIN WITH MINERALS) TABS tablet  . Multiple Vitamins-Minerals (EMERGEN-C IMMUNE PLUS/VIT D) CHEW  . NARCAN 4 MG/0.1ML LIQD nasal spray kit  . omeprazole (PRILOSEC) 20 MG capsule  . oxyCODONE-acetaminophen (PERCOCET) 10-325 MG tablet  . potassium chloride (KLOR-CON) 10 MEQ tablet  . protein supplement shake (PREMIER PROTEIN) LIQD  . senna (SENOKOT) 8.6 MG TABS tablet   No current facility-administered medications for this encounter.    Myra Gianotti, PA-C Surgical Short Stay/Anesthesiology [facility] Phone [phone] [facility] Phone [phone] 12/07/2020 9:55 AM

## 2020-12-07 NOTE — Anesthesia Preprocedure Evaluation (Addendum)
Anesthesia Evaluation  Patient identified by MRN, date of birth, ID band Patient awake    Reviewed: Allergy & Precautions, NPO status , Patient's Chart, lab work & pertinent test results, reviewed documented beta blocker date and time   History of Anesthesia Complications Negative for: history of anesthetic complications  Airway Mallampati: II  TM Distance: >3 FB Neck ROM: Limited    Dental  (+) Edentulous Upper, Edentulous Lower   Pulmonary asthma , former smoker,    Pulmonary exam normal        Cardiovascular hypertension, Pt. on medications and Pt. on home beta blockers Normal cardiovascular exam     Neuro/Psych  Headaches, Anxiety Depression    GI/Hepatic Neg liver ROS, GERD  Controlled,  Endo/Other  Hyperthyroidism Morbid obesity  Renal/GU negative Renal ROS  negative genitourinary   Musculoskeletal  (+) Arthritis , Fibromyalgia -, narcotic dependent  Abdominal   Peds  Hematology negative hematology ROS (+)   Anesthesia Other Findings Day of surgery medications reviewed with patient.  Reproductive/Obstetrics negative OB ROS                          Anesthesia Physical Anesthesia Plan  ASA: III  Anesthesia Plan: General   Post-op Pain Management:    Induction:   PONV Risk Score and Plan: 3 and Midazolam, Treatment may vary due to age or medical condition, Ondansetron and Dexamethasone  Airway Management Planned: Oral ETT  Additional Equipment: None  Intra-op Plan:   Post-operative Plan: Extubation in OR  Informed Consent: I have reviewed the patients History and Physical, chart, labs and discussed the procedure including the risks, benefits and alternatives for the proposed anesthesia with the patient or authorized representative who has indicated his/her understanding and acceptance.     Dental advisory given  Plan Discussed with: CRNA  Anesthesia Plan Comments: (PAT  note written 12/07/2020 by Shonna Chock, PA-C. )      Anesthesia Quick Evaluation

## 2020-12-08 ENCOUNTER — Ambulatory Visit (HOSPITAL_COMMUNITY): Payer: Medicare Other

## 2020-12-08 ENCOUNTER — Observation Stay (HOSPITAL_COMMUNITY)
Admission: RE | Admit: 2020-12-08 | Discharge: 2020-12-09 | Disposition: A | Discharge status: Home / Self Care | Payer: Medicare Other | Attending: Neurosurgery | Admitting: Neurosurgery

## 2020-12-08 ENCOUNTER — Ambulatory Visit (HOSPITAL_COMMUNITY)
Admission: RE | Disposition: A | Discharge status: Home / Self Care | Payer: Self-pay | Source: Home / Self Care | Attending: Neurosurgery

## 2020-12-08 ENCOUNTER — Ambulatory Visit (HOSPITAL_COMMUNITY): Payer: Medicare Other | Admitting: Anesthesiology

## 2020-12-08 ENCOUNTER — Encounter (HOSPITAL_COMMUNITY): Payer: Self-pay | Admitting: Neurosurgery

## 2020-12-08 ENCOUNTER — Other Ambulatory Visit: Payer: Self-pay

## 2020-12-08 ENCOUNTER — Ambulatory Visit (HOSPITAL_COMMUNITY): Payer: Medicare Other | Admitting: Vascular Surgery

## 2020-12-08 DIAGNOSIS — I10 Essential (primary) hypertension: Secondary | ICD-10-CM | POA: Insufficient documentation

## 2020-12-08 DIAGNOSIS — Z419 Encounter for procedure for purposes other than remedying health state, unspecified: Secondary | ICD-10-CM

## 2020-12-08 DIAGNOSIS — M4712 Other spondylosis with myelopathy, cervical region: Secondary | ICD-10-CM | POA: Diagnosis present

## 2020-12-08 DIAGNOSIS — Z7982 Long term (current) use of aspirin: Secondary | ICD-10-CM | POA: Insufficient documentation

## 2020-12-08 DIAGNOSIS — Z79899 Other long term (current) drug therapy: Secondary | ICD-10-CM | POA: Diagnosis not present

## 2020-12-08 DIAGNOSIS — Z91040 Latex allergy status: Secondary | ICD-10-CM | POA: Diagnosis not present

## 2020-12-08 DIAGNOSIS — M50021 Cervical disc disorder at C4-C5 level with myelopathy: Secondary | ICD-10-CM | POA: Diagnosis not present

## 2020-12-08 DIAGNOSIS — Z87891 Personal history of nicotine dependence: Secondary | ICD-10-CM | POA: Diagnosis not present

## 2020-12-08 DIAGNOSIS — Z23 Encounter for immunization: Secondary | ICD-10-CM | POA: Insufficient documentation

## 2020-12-08 DIAGNOSIS — M5020 Other cervical disc displacement, unspecified cervical region: Secondary | ICD-10-CM | POA: Diagnosis present

## 2020-12-08 HISTORY — PX: ANTERIOR CERVICAL DECOMP/DISCECTOMY FUSION: SHX1161

## 2020-12-08 SURGERY — ANTERIOR CERVICAL DECOMPRESSION/DISCECTOMY FUSION 1 LEVEL
Anesthesia: General | Site: Spine Cervical

## 2020-12-08 MED ORDER — CEPHALEXIN 500 MG PO CAPS: 500.0000 mg | ORAL_CAPSULE | Freq: Four times a day (QID) | ORAL | Status: DC

## 2020-12-08 MED ORDER — SENNA 8.6 MG PO TABS
1.0000 | ORAL_TABLET | Freq: Two times a day (BID) | ORAL | Status: DC
  Administered 2020-12-08 – 2020-12-09 (×2): 8.6 mg via ORAL
  Filled 2020-12-08 (×2): qty 1

## 2020-12-08 MED ORDER — HYDROCHLOROTHIAZIDE 25 MG PO TABS
25.0000 mg | ORAL_TABLET | Freq: Every day | ORAL | Status: DC
  Administered 2020-12-09: 25 mg via ORAL
  Filled 2020-12-08 (×2): qty 1

## 2020-12-08 MED ORDER — FENTANYL CITRATE (PF) 100 MCG/2ML IJ SOLN
INTRAMUSCULAR | Status: DC | PRN
  Administered 2020-12-08: 100 ug via INTRAVENOUS
  Administered 2020-12-08 (×3): 50 ug via INTRAVENOUS

## 2020-12-08 MED ORDER — ROCURONIUM BROMIDE 10 MG/ML (PF) SYRINGE
PREFILLED_SYRINGE | INTRAVENOUS | Status: AC
  Filled 2020-12-08: qty 10

## 2020-12-08 MED ORDER — ONDANSETRON HCL 4 MG PO TABS: 4.0000 mg | ORAL_TABLET | Freq: Four times a day (QID) | ORAL | Status: DC | PRN

## 2020-12-08 MED ORDER — MIDAZOLAM HCL 2 MG/2ML IJ SOLN
INTRAMUSCULAR | Status: AC
  Filled 2020-12-08: qty 2

## 2020-12-08 MED ORDER — PANTOPRAZOLE SODIUM 40 MG PO TBEC
40.0000 mg | DELAYED_RELEASE_TABLET | Freq: Every day | ORAL | Status: DC
  Administered 2020-12-09: 40 mg via ORAL
  Filled 2020-12-08: qty 1

## 2020-12-08 MED ORDER — HYDROMORPHONE HCL 1 MG/ML IJ SOLN
INTRAMUSCULAR | Status: AC
  Filled 2020-12-08: qty 0.5

## 2020-12-08 MED ORDER — HEPARIN SODIUM (PORCINE) 5000 UNIT/ML IJ SOLN
5000.0000 [IU] | Freq: Three times a day (TID) | INTRAMUSCULAR | Status: DC
  Administered 2020-12-08 – 2020-12-09 (×2): 5000 [IU] via SUBCUTANEOUS
  Filled 2020-12-08 (×2): qty 1

## 2020-12-08 MED ORDER — DEXAMETHASONE SODIUM PHOSPHATE 10 MG/ML IJ SOLN
INTRAMUSCULAR | Status: DC | PRN
  Administered 2020-12-08: 10 mg via INTRAVENOUS

## 2020-12-08 MED ORDER — MIDAZOLAM HCL 5 MG/5ML IJ SOLN
INTRAMUSCULAR | Status: DC | PRN
  Administered 2020-12-08: 2 mg via INTRAVENOUS

## 2020-12-08 MED ORDER — ATENOLOL 25 MG PO TABS
50.0000 mg | ORAL_TABLET | Freq: Every day | ORAL | Status: DC
  Administered 2020-12-09: 50 mg via ORAL
  Filled 2020-12-08: qty 2

## 2020-12-08 MED ORDER — CEFAZOLIN SODIUM-DEXTROSE 2-4 GM/100ML-% IV SOLN
2.0000 g | INTRAVENOUS | Status: AC
  Administered 2020-12-08: 2 g via INTRAVENOUS
  Filled 2020-12-08: qty 100

## 2020-12-08 MED ORDER — SODIUM CHLORIDE 0.9% FLUSH
3.0000 mL | INTRAVENOUS | Status: DC | PRN
Start: 2020-12-08 — End: 2020-12-09

## 2020-12-08 MED ORDER — BISACODYL 5 MG PO TBEC: 5.0000 mg | DELAYED_RELEASE_TABLET | Freq: Every day | ORAL | Status: DC | PRN

## 2020-12-08 MED ORDER — KETAMINE HCL 50 MG/5ML IJ SOSY
PREFILLED_SYRINGE | INTRAMUSCULAR | Status: AC
  Filled 2020-12-08: qty 5

## 2020-12-08 MED ORDER — ROCURONIUM BROMIDE 10 MG/ML (PF) SYRINGE
PREFILLED_SYRINGE | INTRAVENOUS | Status: DC | PRN
  Administered 2020-12-08: 20 mg via INTRAVENOUS
  Administered 2020-12-08: 10 mg via INTRAVENOUS
  Administered 2020-12-08: 60 mg via INTRAVENOUS
  Administered 2020-12-08: 10 mg via INTRAVENOUS

## 2020-12-08 MED ORDER — ONDANSETRON HCL 4 MG/2ML IJ SOLN
INTRAMUSCULAR | Status: DC | PRN
  Administered 2020-12-08: 4 mg via INTRAVENOUS

## 2020-12-08 MED ORDER — ACETAMINOPHEN 500 MG PO TABS: 1000.0000 mg | ORAL_TABLET | Freq: Once | ORAL | Status: DC | PRN

## 2020-12-08 MED ORDER — SUGAMMADEX SODIUM 200 MG/2ML IV SOLN
INTRAVENOUS | Status: DC | PRN
  Administered 2020-12-08: 200 mg via INTRAVENOUS

## 2020-12-08 MED ORDER — ADULT MULTIVITAMIN W/MINERALS CH
1.0000 | ORAL_TABLET | Freq: Every day | ORAL | Status: DC
  Administered 2020-12-09: 1 via ORAL
  Filled 2020-12-08: qty 1

## 2020-12-08 MED ORDER — DIPHENHYDRAMINE HCL 25 MG PO CAPS
50.0000 mg | ORAL_CAPSULE | Freq: Three times a day (TID) | ORAL | Status: DC
  Administered 2020-12-08 – 2020-12-09 (×2): 50 mg via ORAL
  Filled 2020-12-08 (×2): qty 2

## 2020-12-08 MED ORDER — GABAPENTIN 600 MG PO TABS
1200.0000 mg | ORAL_TABLET | Freq: Three times a day (TID) | ORAL | Status: DC
  Administered 2020-12-08 – 2020-12-09 (×2): 1200 mg via ORAL
  Filled 2020-12-08 (×2): qty 2

## 2020-12-08 MED ORDER — FENTANYL CITRATE (PF) 250 MCG/5ML IJ SOLN
INTRAMUSCULAR | Status: AC
  Filled 2020-12-08: qty 5

## 2020-12-08 MED ORDER — FENTANYL CITRATE (PF) 100 MCG/2ML IJ SOLN
INTRAMUSCULAR | Status: AC
  Filled 2020-12-08: qty 2

## 2020-12-08 MED ORDER — MAGNESIUM CITRATE PO SOLN: 1.0000 | Freq: Once | ORAL | Status: DC | PRN

## 2020-12-08 MED ORDER — CHLORHEXIDINE GLUCONATE 0.12 % MT SOLN
15.0000 mL | Freq: Once | OROMUCOSAL | Status: AC
  Administered 2020-12-08: 15 mL via OROMUCOSAL
  Filled 2020-12-08: qty 15

## 2020-12-08 MED ORDER — FENTANYL CITRATE (PF) 100 MCG/2ML IJ SOLN
25.0000 ug | INTRAMUSCULAR | Status: DC | PRN
  Administered 2020-12-08 (×3): 50 ug via INTRAVENOUS

## 2020-12-08 MED ORDER — ACETAMINOPHEN 500 MG PO TABS
1000.0000 mg | ORAL_TABLET | Freq: Four times a day (QID) | ORAL | Status: DC
  Administered 2020-12-08 – 2020-12-09 (×3): 1000 mg via ORAL
  Filled 2020-12-08 (×3): qty 2

## 2020-12-08 MED ORDER — ACETAMINOPHEN 10 MG/ML IV SOLN
INTRAVENOUS | Status: AC
  Filled 2020-12-08: qty 100

## 2020-12-08 MED ORDER — CELECOXIB 200 MG PO CAPS: 200.0000 mg | ORAL_CAPSULE | Freq: Two times a day (BID) | ORAL | Status: DC

## 2020-12-08 MED ORDER — LIDOCAINE 2% (20 MG/ML) 5 ML SYRINGE
INTRAMUSCULAR | Status: AC
  Filled 2020-12-08: qty 5

## 2020-12-08 MED ORDER — 0.9 % SODIUM CHLORIDE (POUR BTL) OPTIME
TOPICAL | Status: DC | PRN
  Administered 2020-12-08: 1000 mL

## 2020-12-08 MED ORDER — ZOLPIDEM TARTRATE 5 MG PO TABS: 5.0000 mg | ORAL_TABLET | Freq: Every evening | ORAL | Status: DC | PRN

## 2020-12-08 MED ORDER — OXYCODONE HCL 5 MG PO TABS
10.0000 mg | ORAL_TABLET | ORAL | Status: DC | PRN
  Administered 2020-12-09: 10 mg via ORAL
  Filled 2020-12-08: qty 2

## 2020-12-08 MED ORDER — MORPHINE SULFATE (PF) 2 MG/ML IV SOLN
2.0000 mg | INTRAVENOUS | Status: DC | PRN
  Administered 2020-12-09: 2 mg via INTRAVENOUS
  Filled 2020-12-08: qty 1

## 2020-12-08 MED ORDER — LIDOCAINE-EPINEPHRINE 0.5 %-1:200000 IJ SOLN
INTRAMUSCULAR | Status: AC
  Filled 2020-12-08: qty 1

## 2020-12-08 MED ORDER — ORAL CARE MOUTH RINSE: 15.0000 mL | Freq: Once | OROMUCOSAL | Status: AC

## 2020-12-08 MED ORDER — LACTATED RINGERS IV SOLN: INTRAVENOUS | Status: DC

## 2020-12-08 MED ORDER — OXYCODONE HCL 5 MG PO TABS
5.0000 mg | ORAL_TABLET | Freq: Once | ORAL | Status: AC | PRN
Start: 2020-12-08 — End: 2020-12-08
  Administered 2020-12-08: 5 mg via ORAL

## 2020-12-08 MED ORDER — CEFAZOLIN SODIUM-DEXTROSE 1-4 GM/50ML-% IV SOLN
1.0000 g | Freq: Four times a day (QID) | INTRAVENOUS | Status: AC
  Administered 2020-12-08 – 2020-12-09 (×3): 1 g via INTRAVENOUS
  Filled 2020-12-08 (×3): qty 50

## 2020-12-08 MED ORDER — PROPOFOL 10 MG/ML IV BOLUS
INTRAVENOUS | Status: DC | PRN
  Administered 2020-12-08: 30 mg via INTRAVENOUS
  Administered 2020-12-08: 200 mg via INTRAVENOUS

## 2020-12-08 MED ORDER — DEXAMETHASONE SODIUM PHOSPHATE 10 MG/ML IJ SOLN
INTRAMUSCULAR | Status: AC
  Filled 2020-12-08: qty 1

## 2020-12-08 MED ORDER — INFLUENZA VAC SPLIT QUAD 0.5 ML IM SUSY
0.5000 mL | PREFILLED_SYRINGE | INTRAMUSCULAR | Status: AC
  Administered 2020-12-09: 0.5 mL via INTRAMUSCULAR
  Filled 2020-12-08: qty 0.5

## 2020-12-08 MED ORDER — KETAMINE HCL 10 MG/ML IJ SOLN
INTRAMUSCULAR | Status: DC | PRN
  Administered 2020-12-08: 10 mg via INTRAVENOUS
  Administered 2020-12-08: 50 mg via INTRAVENOUS
  Administered 2020-12-08: 10 mg via INTRAVENOUS

## 2020-12-08 MED ORDER — PHENYLEPHRINE 40 MCG/ML (10ML) SYRINGE FOR IV PUSH (FOR BLOOD PRESSURE SUPPORT)
PREFILLED_SYRINGE | INTRAVENOUS | Status: AC
  Filled 2020-12-08: qty 10

## 2020-12-08 MED ORDER — OXYCODONE HCL ER 10 MG PO T12A
10.0000 mg | EXTENDED_RELEASE_TABLET | Freq: Two times a day (BID) | ORAL | Status: DC
Start: 2020-12-08 — End: 2020-12-09
  Administered 2020-12-08 – 2020-12-09 (×2): 10 mg via ORAL
  Filled 2020-12-08 (×2): qty 1

## 2020-12-08 MED ORDER — SODIUM CHLORIDE 0.9% FLUSH
3.0000 mL | Freq: Two times a day (BID) | INTRAVENOUS | Status: DC
  Administered 2020-12-09: 3 mL via INTRAVENOUS

## 2020-12-08 MED ORDER — DIAZEPAM 5 MG PO TABS
5.0000 mg | ORAL_TABLET | Freq: Four times a day (QID) | ORAL | Status: DC | PRN
Start: 2020-12-08 — End: 2020-12-09

## 2020-12-08 MED ORDER — THROMBIN (RECOMBINANT) 5000 UNITS EX SOLR
CUTANEOUS | Status: AC
  Filled 2020-12-08: qty 10000

## 2020-12-08 MED ORDER — SENNOSIDES-DOCUSATE SODIUM 8.6-50 MG PO TABS: 1.0000 | ORAL_TABLET | Freq: Every evening | ORAL | Status: DC | PRN

## 2020-12-08 MED ORDER — ASCORBIC ACID 500 MG PO TABS
500.0000 mg | ORAL_TABLET | Freq: Every day | ORAL | Status: DC
  Administered 2020-12-09: 500 mg via ORAL
  Filled 2020-12-08: qty 1

## 2020-12-08 MED ORDER — PROPOFOL 10 MG/ML IV BOLUS
INTRAVENOUS | Status: AC
  Filled 2020-12-08: qty 20

## 2020-12-08 MED ORDER — OXYCODONE HCL 5 MG PO TABS
ORAL_TABLET | ORAL | Status: AC
  Filled 2020-12-08: qty 1

## 2020-12-08 MED ORDER — ONDANSETRON HCL 4 MG/2ML IJ SOLN
INTRAMUSCULAR | Status: AC
  Filled 2020-12-08: qty 2

## 2020-12-08 MED ORDER — OXYCODONE HCL 5 MG/5ML PO SOLN: 5.0000 mg | Freq: Once | ORAL | Status: AC | PRN

## 2020-12-08 MED ORDER — CEFAZOLIN SODIUM-DEXTROSE 1-4 GM/50ML-% IV SOLN
1.0000 g | Freq: Three times a day (TID) | INTRAVENOUS | Status: DC
  Filled 2020-12-08 (×2): qty 50

## 2020-12-08 MED ORDER — ALBUTEROL SULFATE (2.5 MG/3ML) 0.083% IN NEBU: 2.5000 mg | INHALATION_SOLUTION | Freq: Four times a day (QID) | RESPIRATORY_TRACT | Status: DC | PRN

## 2020-12-08 MED ORDER — ACETAMINOPHEN 160 MG/5ML PO SOLN: 1000.0000 mg | Freq: Once | ORAL | Status: DC | PRN

## 2020-12-08 MED ORDER — POTASSIUM CHLORIDE CRYS ER 10 MEQ PO TBCR
10.0000 meq | EXTENDED_RELEASE_TABLET | Freq: Every day | ORAL | Status: DC
  Administered 2020-12-09: 10 meq via ORAL
  Filled 2020-12-08 (×2): qty 1

## 2020-12-08 MED ORDER — ACETAMINOPHEN 10 MG/ML IV SOLN
1000.0000 mg | Freq: Once | INTRAVENOUS | Status: DC | PRN
  Administered 2020-12-08: 1000 mg via INTRAVENOUS

## 2020-12-08 MED ORDER — CEFAZOLIN SODIUM-DEXTROSE 2-4 GM/100ML-% IV SOLN
2.0000 g | Freq: Three times a day (TID) | INTRAVENOUS | Status: DC
  Filled 2020-12-08: qty 100

## 2020-12-08 MED ORDER — LUBIPROSTONE 24 MCG PO CAPS
24.0000 ug | ORAL_CAPSULE | Freq: Two times a day (BID) | ORAL | Status: DC
  Administered 2020-12-09: 24 ug via ORAL
  Filled 2020-12-08: qty 1

## 2020-12-08 MED ORDER — PHENYLEPHRINE 40 MCG/ML (10ML) SYRINGE FOR IV PUSH (FOR BLOOD PRESSURE SUPPORT)
PREFILLED_SYRINGE | INTRAVENOUS | Status: DC | PRN
  Administered 2020-12-08 (×3): 80 ug via INTRAVENOUS

## 2020-12-08 MED ORDER — HYDROXYZINE HCL 25 MG PO TABS
50.0000 mg | ORAL_TABLET | Freq: Two times a day (BID) | ORAL | Status: DC
  Administered 2020-12-08 – 2020-12-09 (×2): 50 mg via ORAL
  Filled 2020-12-08 (×2): qty 2

## 2020-12-08 MED ORDER — HEMOSTATIC AGENTS (NO CHARGE) OPTIME
TOPICAL | Status: DC | PRN
  Administered 2020-12-08 (×2): 1 via TOPICAL

## 2020-12-08 MED ORDER — MENTHOL 3 MG MT LOZG: 1.0000 | LOZENGE | OROMUCOSAL | Status: DC | PRN

## 2020-12-08 MED ORDER — PHENOL 1.4 % MT LIQD
1.0000 | OROMUCOSAL | Status: DC | PRN
  Administered 2020-12-09: 1 via OROMUCOSAL
  Filled 2020-12-08: qty 177

## 2020-12-08 MED ORDER — ASPIRIN EC 81 MG PO TBEC
81.0000 mg | DELAYED_RELEASE_TABLET | Freq: Every day | ORAL | Status: DC
  Administered 2020-12-09: 81 mg via ORAL
  Filled 2020-12-08: qty 1

## 2020-12-08 MED ORDER — ACETAMINOPHEN 650 MG RE SUPP: 650.0000 mg | RECTAL | Status: DC | PRN

## 2020-12-08 MED ORDER — POTASSIUM CHLORIDE IN NACL 20-0.9 MEQ/L-% IV SOLN: INTRAVENOUS | Status: DC

## 2020-12-08 MED ORDER — HEMOSTATIC AGENTS (NO CHARGE) OPTIME
TOPICAL | Status: DC | PRN
  Administered 2020-12-08: 1 via TOPICAL

## 2020-12-08 MED ORDER — ONDANSETRON HCL 4 MG/2ML IJ SOLN: 4.0000 mg | Freq: Four times a day (QID) | INTRAMUSCULAR | Status: DC | PRN

## 2020-12-08 MED ORDER — CHLORZOXAZONE 500 MG PO TABS
500.0000 mg | ORAL_TABLET | Freq: Three times a day (TID) | ORAL | Status: DC
  Administered 2020-12-08 – 2020-12-09 (×2): 500 mg via ORAL
  Filled 2020-12-08 (×2): qty 1

## 2020-12-08 MED ORDER — LIDOCAINE 2% (20 MG/ML) 5 ML SYRINGE
INTRAMUSCULAR | Status: DC | PRN
  Administered 2020-12-08: 100 mg via INTRAVENOUS

## 2020-12-08 MED ORDER — ACETAMINOPHEN 325 MG PO TABS: 650.0000 mg | ORAL_TABLET | ORAL | Status: DC | PRN

## 2020-12-08 MED ORDER — SODIUM CHLORIDE 0.9 % IV SOLN: 250.0000 mL | INTRAVENOUS | Status: DC

## 2020-12-08 MED ORDER — OXYCODONE HCL 5 MG PO TABS
5.0000 mg | ORAL_TABLET | ORAL | Status: DC | PRN
  Administered 2020-12-09: 5 mg via ORAL
  Filled 2020-12-08: qty 1

## 2020-12-08 MED ORDER — LIDOCAINE-EPINEPHRINE 0.5 %-1:200000 IJ SOLN
INTRAMUSCULAR | Status: DC | PRN
  Administered 2020-12-08: 7 mL

## 2020-12-08 MED ORDER — GUAIFENESIN ER 600 MG PO TB12: 600.0000 mg | ORAL_TABLET | Freq: Two times a day (BID) | ORAL | Status: DC | PRN

## 2020-12-08 SURGICAL SUPPLY — 54 items
ADH SKN CLS APL DERMABOND .7 (GAUZE/BANDAGES/DRESSINGS) ×1
BAND INSRT 18 STRL LF DISP RB (MISCELLANEOUS) ×2
BAND RUBBER #18 3X1/16 STRL (MISCELLANEOUS) ×4 IMPLANT
BLADE CLIPPER SURG (BLADE) IMPLANT
BUR DRUM 4.0 (BURR) ×2 IMPLANT
BUR MATCHSTICK NEURO 3.0 LAGG (BURR) ×2 IMPLANT
CANISTER SUCT 3000ML PPV (MISCELLANEOUS) ×2 IMPLANT
CARTRIDGE OIL MAESTRO DRILL (MISCELLANEOUS) ×1 IMPLANT
COVER WAND RF STERILE (DRAPES) ×1 IMPLANT
DECANTER SPIKE VIAL GLASS SM (MISCELLANEOUS) ×2 IMPLANT
DERMABOND ADVANCED (GAUZE/BANDAGES/DRESSINGS) ×1
DERMABOND ADVANCED .7 DNX12 (GAUZE/BANDAGES/DRESSINGS) ×1 IMPLANT
DIFFUSER DRILL AIR PNEUMATIC (MISCELLANEOUS) ×2 IMPLANT
DRAPE HALF SHEET 40X57 (DRAPES) IMPLANT
DRAPE LAPAROTOMY 100X72 PEDS (DRAPES) ×2 IMPLANT
DRAPE MICROSCOPE LEICA (MISCELLANEOUS) ×2 IMPLANT
DURAPREP 6ML APPLICATOR 50/CS (WOUND CARE) ×2 IMPLANT
ELECT COATED BLADE 2.86 ST (ELECTRODE) ×2 IMPLANT
ELECT REM PT RETURN 9FT ADLT (ELECTROSURGICAL) ×2
ELECTRODE REM PT RTRN 9FT ADLT (ELECTROSURGICAL) ×1 IMPLANT
GAUZE 4X4 16PLY RFD (DISPOSABLE) IMPLANT
GLOVE ECLIPSE 6.5 STRL STRAW (GLOVE) ×1 IMPLANT
GLOVE EXAM NITRILE XL STR (GLOVE) IMPLANT
GLOVE SS PI 9.0 STRL (GLOVE) ×1 IMPLANT
GLOVE SURG POLYISO LF SZ6.5 (GLOVE) ×2 IMPLANT
GLOVE SURG SS PI 7.5 STRL IVOR (GLOVE) ×4 IMPLANT
GOWN STRL REUS W/ TWL LRG LVL3 (GOWN DISPOSABLE) ×2 IMPLANT
GOWN STRL REUS W/ TWL XL LVL3 (GOWN DISPOSABLE) IMPLANT
GOWN STRL REUS W/TWL 2XL LVL3 (GOWN DISPOSABLE) IMPLANT
GOWN STRL REUS W/TWL LRG LVL3 (GOWN DISPOSABLE) ×4
GOWN STRL REUS W/TWL XL LVL3 (GOWN DISPOSABLE) ×2
KIT BASIN OR (CUSTOM PROCEDURE TRAY) ×2 IMPLANT
KIT TURNOVER KIT B (KITS) ×2 IMPLANT
NDL HYPO 25X1 1.5 SAFETY (NEEDLE) ×1 IMPLANT
NDL SPNL 22GX3.5 QUINCKE BK (NEEDLE) ×1 IMPLANT
NEEDLE HYPO 25X1 1.5 SAFETY (NEEDLE) ×2 IMPLANT
NEEDLE SPNL 22GX3.5 QUINCKE BK (NEEDLE) ×2 IMPLANT
NS IRRIG 1000ML POUR BTL (IV SOLUTION) ×2 IMPLANT
OIL CARTRIDGE MAESTRO DRILL (MISCELLANEOUS) ×2
PACK LAMINECTOMY NEURO (CUSTOM PROCEDURE TRAY) ×2 IMPLANT
PAD ARMBOARD 7.5X6 YLW CONV (MISCELLANEOUS) ×6 IMPLANT
PIN DISTRACTION 14MM (PIN) IMPLANT
PLATE CERV RES 10X36X125 LT (Plate) ×1 IMPLANT
SCREW VA SD 4.2X12 (Screw) ×3 IMPLANT
SCREW VA ST 4.2X12 (Screw) ×1 IMPLANT
SPACER CERV FRGE 12X14X10-0 (Spacer) ×1 IMPLANT
SPONGE INTESTINAL PEANUT (DISPOSABLE) ×2 IMPLANT
SPONGE SURGIFOAM ABS GEL SZ50 (HEMOSTASIS) ×2 IMPLANT
SUT VIC AB 0 CT1 27 (SUTURE) ×2
SUT VIC AB 0 CT1 27XBRD ANTBC (SUTURE) IMPLANT
SUT VIC AB 3-0 SH 8-18 (SUTURE) ×3 IMPLANT
TOWEL GREEN STERILE (TOWEL DISPOSABLE) ×2 IMPLANT
TOWEL GREEN STERILE FF (TOWEL DISPOSABLE) ×2 IMPLANT
WATER STERILE IRR 1000ML POUR (IV SOLUTION) ×2 IMPLANT

## 2020-12-08 NOTE — Progress Notes (Signed)
Received pt from the PACU, alert X4, oriented to her room, does not want bed alarm set on, MD orders implemented, will continue to monitor, call light in reach.

## 2020-12-08 NOTE — Op Note (Signed)
12/08/2020  5:48 PM  PATIENT:  [name]  60 y.o. female with cord compression at C4/5. Admitted for acdf C4/5  PRE-OPERATIVE DIAGNOSIS:  Other spondylosis with myelopathy, cervical region hnp C4/5 Spondylolisthesis C4/5  POST-OPERATIVE DIAGNOSIS: same PROCEDURE:  Anterior Cervical decompression C4/5 Arthrodesis C4/5 with 38mm structural allograft Anterior instrumentation(Globus) C4/5 Removal C3/4 plate and screws  SURGEON:   Surgeon(s): Coletta Memos, MD   ASSISTANTS:none  ANESTHESIA:   general  EBL:  Total I/O In: 100 [IV Piggyback:100] Out: 100 [Blood:100]  BLOOD ADMINISTERED:none  CELL SAVER GIVEN:none  COUNT:per nursing  DRAINS: none   SPECIMEN:  No Specimen  DICTATION: [name] was taken to the operating room, intubated, and placed under general anesthesia without difficulty. She was positioned supine with her head in slight extension on a horseshoe headrest. The neck was prepped and draped in a sterile manner. I infiltrated 7 cc's 1/2%lidocaine/1:200,000 strength epinephrine into the planned incision starting from the midline to the medial border of the left sternocleidomastoid muscle. I opened the incision with a 10 blade and dissected sharply through soft tissue to the platysma. I dissected in the plane superior to the platysma both rostrally and caudally. I then opened the platysma in a horizontal fashion with Metzenbaum scissors, and dissected in the inferior plane rostrally and caudally. With both blunt and sharp technique I created an avascular corridor to the cervical spine. I identified the plate at Q2/5 , and at C3/4. It was clear the hardware at C3/4 would need to be removed in order to fully decompress and subsequently secure a new plate at Z5/6. Marland Kitchen I then reflected the longus colli from C4 to C5 and placed self retaining retractors. I opened the disc space(s) at C4/5 with a 15 blade. I removed disc with curettes, Kerrison punches, and the drill. Using the  drill I removed osteophytes and prepared for the decompression.  I decompressed the spinal canal and the C5 root(s) with the drill, Kerrison punches, and the curettes. I used the microscope to aid in microdissection. I removed the posterior longitudinal ligament to fully expose and decompress the thecal sac. I exposed the roots laterally taking down the C4/5 uncovertebral joints. With the decompression complete I moved on to the arthrodesis. I used the drill to level the surfaces of C4 and 5. I removed soft tissue to prepare the disc space and the bony surfaces. I measured the space and placed a 66mm structural allograft into the disc space.  I then placed the anterior instrumentation. I placed 2 screws in each vertebral body through the plate. I locked the screws into place. Intraoperative xray showed the graft, plate, and screws to be in good position. I irrigated the wound, achieved hemostasis, and closed the wound in layers. I approximated the platysma, and the subcuticular plane with vicryl sutures. I used Dermabond for a sterile dressing.   PLAN OF CARE: Admit for overnight observation  PATIENT DISPOSITION:  PACU - hemodynamically stable.   Delay start of Pharmacological VTE agent (>24hrs) due to surgical blood loss or risk of bleeding:  no

## 2020-12-08 NOTE — H&P (Signed)
LMP 02/18/2016     [name] returns today with an MRI of cervical spine and looks like she had deteriorated at the level in between the 2 fusions.  I think she probably needs to go ahead and have an HPV done at that level to decompress the spinal cord and canal.  I explained this to [name] and it seems that she just cannot get out of the way of being operated on, but it certainly is something which can cause problems and given that, I think the best option is to just go ahead and do the surgery.   Allergies  Allergen Reactions  . Monosodium Glutamate Anaphylaxis and Swelling    Eyes swollen shut, facial swelling, tongue swelling.  . Shellfish Allergy Anaphylaxis  . Celebrex [Celecoxib] Itching    Only allergic to generic brand  . Contrast Media [Iodinated Diagnostic Agents] Itching and Nausea Only    "could not walk"  . Diclofenac Itching    Can take the name brand Voltaren  . Other Other (See Comments)    Pet dander, Mildew, Mold  Cannot tolerate generic DICLOFENAC GEL--MUST USE BRAND NAME ([place])   . Latex Itching and Rash    Latex glove with powder   Past Medical History:  Diagnosis Date  . Anemia   . Ankle syndesmosis disruption   . Anxiety    takes Ativan and Valium, after mother passed  . Arthritis    bilateral knees s/p knee replacement bilaterally  . Asthma   . Bronchitis   . Bruising    pt states unexplained d/t fibromyalgia  . Chronic back pain    2012 tailbone surgery and 3 lower discs.   . Closed fracture of distal end of right fibula and tibia   . Depression    from Fibromyalgia diagnosis; not taking medicine. since 2001  . Dizziness    rarely  . Fibromyalgia    diagnosed 2001  . GERD (gastroesophageal reflux disease)    Prilosec occasionally  . Headache(784.0)    "sinus headaches"  . History of hiatal hernia   . Hypertension    since 2013  . Hyperthyroidism    subclinical, no treatment; thyroid nodules  . IBS (irritable bowel  syndrome)   . Impaired memory    states from fibromyalgia  . Insomnia    takes Ambien  . Jones fracture    left foot fifth metatarsal  . Multiple allergies    including latex, pet dander, shellfish, pet dander  . Painful orthopaedic hardware right ankle 05/27/2016  . Seasonal allergies   . Shortness of breath    Occasional with exertion;   . Sore gums    this is why pt is on Amoxil-only takes for dental work  . Tachycardia   . Thyroid goiter   . Varicose vein    protrudes above skin-per pt;vein popped and bruised;ultrasound done to make sure that there were no clots;noclots were found   Past Surgical History:  Procedure Laterality Date  . ANTERIOR LUMBAR FUSION  09/20/2011   Procedure: ANTERIOR LUMBAR FUSION 1 LEVEL;  Surgeon: Winfield Cunas;  Location: [place] [facility];  Service: Neurosurgery;  Laterality: N/A;  Lumbar five-Sacral One Anterior Lumbar Interbody Fusion /Dr. Early to Approach   . CERVICAL DISC SURGERY     WITH TITANIUM PLATE IN NECK---LEFT SIDE  . CERVICAL SPINE SURGERY  03/24/2019  . CESAREAN SECTION    . fibroidectomy    . FRACTURE SURGERY  05/08/2010  Jones fracture left foot fifth metatarsal  . HARDWARE REMOVAL Right 05/28/2016   Procedure: RIGHT ANKLE HARDWARE REMOVAL;  Surgeon: Elsie Saas, MD;  Location: [place];  Service: Orthopedics;  Laterality: Right;  . HARDWARE REMOVAL N/A 07/28/2020   Procedure: Removal of Lumbar Hardware;  Surgeon: Ashok Pall, MD;  Location: [place];  Service: Neurosurgery;  Laterality: N/A;  . HERNIA REPAIR     hiatal hernia  . IRRIGATION AND DEBRIDEMENT KNEE  04/09/2012   Procedure: IRRIGATION AND DEBRIDEMENT KNEE;  Surgeon: Yvette Rack., MD;  Location: [facility];  Service: Orthopedics;  Laterality: Right;  . JOINT REPLACEMENT  01-27-2012   left total knee and Right total knee  . KNEE SURGERY  2005   Left knee arthroscopy  . NASAL SEPTOPLASTY W/ TURBINOPLASTY  2007   due to recurrent sinusitis  . ORIF ANKLE  FRACTURE Right 06/21/2015   Procedure: OPEN REDUCTION INTERNAL FIXATION RIGHT DISTAL FIBULA  FRACTURE AND OPEN REDUCTION INTERNAL FIXATION SYNDESMOSIS ;  Surgeon: Elsie Saas, MD;  Location: [facility];  Service: Orthopedics;  Laterality: Right;  . SHOULDER ARTHROSCOPY W/ ROTATOR CUFF REPAIR Right   . SHOULDER SURGERY Left   . SPINE SURGERY  2004   Cervical plate, ACDF  . STERIOD INJECTION  01/27/2012   Procedure: STEROID INJECTION;  Surgeon: Lorn Junes, MD;  Location: [place];  Service: Orthopedics;  Laterality: Right;  . TEE WITHOUT CARDIOVERSION N/A 03/01/2020   Procedure: TRANSESOPHAGEAL ECHOCARDIOGRAM (TEE);  Surgeon: Jerline Pain, MD;  Location: [facility];  Service: Cardiovascular;  Laterality: N/A;  . THORACIC DISCECTOMY  02/16/2020   Procedure: Thoracic ten-eleven Discectomy;  Surgeon: Ashok Pall, MD;  Location: [place];  Service: Neurosurgery;;  . TONSILLECTOMY  2007  . TOTAL KNEE ARTHROPLASTY  01/27/2012   Procedure: TOTAL KNEE ARTHROPLASTY;  Surgeon: Lorn Junes, MD;  Bilateral  . TOTAL KNEE ARTHROPLASTY  04/06/2012   Procedure: TOTAL KNEE ARTHROPLASTY;  Surgeon: Lorn Junes, MD;  Location: [place];  Service: Orthopedics;  Laterality: Right;  . TUBAL LIGATION    . UTERINE FIBROID SURGERY     mid 200s  . WOUND EXPLORATION N/A 02/27/2020   Procedure: WOUND EXPLORATION;  Surgeon: Ashok Pall, MD;  Location: [place];  Service: Neurosurgery;  Laterality: N/A;,  (wound vac upper back)   Family History  Problem Relation Age of Onset  . Stroke Father        passed 2004 from pneumonia  . Alcohol abuse Father   . Pulmonary embolism Mother        after minor knee surgery leading to DVT  . Arthritis Sister   . Depression Sister   . Hypertension Sister   . Diabetes Other        grandmother  . Anesthesia problems Neg Hx   . Hypotension Neg Hx   . Malignant hyperthermia Neg Hx   . Pseudochol deficiency Neg Hx    Social History   Socioeconomic History  .  Marital status: Divorced    Spouse name: Not on file  . Number of children: Not on file  . Years of education: Not on file  . Highest education level: Not on file  Occupational History  . Not on file  Tobacco Use  . Smoking status: Former Smoker    Packs/day: 0.50    Years: 10.00    Pack years: 5.00    Quit date: 12/06/1998    Years since quitting: 22.0  . Smokeless tobacco:  Never Used  Vaping Use  . Vaping Use: Never used  Substance and Sexual Activity  . Alcohol use: No  . Drug use: No  . Sexual activity: Never    Birth control/protection: Post-menopausal  Other Topics Concern  . Not on file  Social History Narrative   Divorced. 1 son.    Disabled from fibromyalgia, arthritis, back and knee surgeries.    Graduated from High school.       Sister is a patient in our clinic, [facility].    Recently cared for at [facility]. Changed to [place] as new network.    Social Determinants of Health   Financial Resource Strain: Not on file  Food Insecurity: Not on file  Transportation Needs: Not on file  Physical Activity: Not on file  Stress: Not on file  Social Connections: Not on file  Intimate Partner Violence: Not on file   Prior to Admission medications   Medication Sig Start Date End Date Taking? Authorizing Provider  acetaminophen (TYLENOL) 500 MG tablet Take 1,500 mg by mouth every 8 (eight) hours as needed for moderate pain.   Yes [provider]  albuterol (VENTOLIN HFA) 108 (90 Base) MCG/ACT inhaler Inhale 2 puffs into the lungs every 6 (six) hours as needed for wheezing or shortness of breath. 03/24/20  Yes Medina-Vargas, Monina C, NP  ascorbic acid (VITAMIN C) 500 MG tablet Take 500 mg by mouth daily.   Yes [provider]  aspirin EC 81 MG tablet Take 81 mg by mouth daily.   Yes [provider]  atenolol (TENORMIN) 50 MG tablet Take 1 tablet (50 mg total) by mouth daily. 03/24/20  Yes Medina-Vargas, Monina C, NP  CELEBREX 200 MG  capsule Take 1 capsule (200 mg total) by mouth 2 (two) times daily. 03/24/20  Yes Medina-Vargas, Monina C, NP  cephALEXin (KEFLEX) 500 MG capsule Take 1 capsule (500 mg total) by mouth 4 (four) times daily. 08/16/20  Yes Carlyle Basques, MD  chlorzoxazone (PARAFON) 500 MG tablet Take 1 tablet (500 mg total) by mouth 3 (three) times daily. 03/24/20  Yes Medina-Vargas, Monina C, NP  cyclobenzaprine (FLEXERIL) 10 MG tablet Take 10 mg by mouth 2 (two) times daily as needed for spasms. 07/17/20  Yes [provider]  diazepam (VALIUM) 5 MG tablet Take 1 tablet (5 mg total) by mouth 2 (two) times daily. 03/22/20  Yes Medina-Vargas, Monina C, NP  diclofenac (FLECTOR) 1.3 % PTCH Place 1 patch onto the skin 2 (two) times daily as needed (pain.).   Yes [provider]  diphenhydrAMINE (BENADRYL) 25 MG tablet Take 50 mg by mouth in the morning, at noon, in the evening, and at bedtime.   Yes [provider]  gabapentin (NEURONTIN) 600 MG tablet Take 2 tablets (1,200 mg total) by mouth 3 (three) times daily. 03/24/20  Yes Medina-Vargas, Monina C, NP  guaiFENesin (MUCINEX) 600 MG 12 hr tablet Take 600 mg by mouth 2 (two) times daily as needed for cough.   Yes [provider]  hydrochlorothiazide (HYDRODIURIL) 25 MG tablet Take 1 tablet (25 mg total) by mouth daily. One tablet by mouth in the morning. 03/24/20  Yes Medina-Vargas, Monina C, NP  hydrOXYzine (ATARAX/VISTARIL) 50 MG tablet Take 50 mg by mouth 2 (two) times daily. 06/23/20  Yes [provider]  lubiprostone (AMITIZA) 24 MCG capsule Take 1 capsule (24 mcg total) by mouth 2 (two) times daily with a meal. 03/24/20  Yes Medina-Vargas, Monina C, NP  methocarbamol (ROBAXIN) 500 MG tablet Take 1 tablet (500 mg total) by mouth 4 (four) times daily. 03/24/20  Yes Medina-Vargas, Monina C, NP  Multiple Minerals-Vitamins (CAL MAG ZINC +D3 PO) Take 1 tablet by mouth in the morning, at noon, and at bedtime.   Yes [provider]  Multiple Vitamin (MULTIVITAMIN WITH MINERALS) TABS tablet Take 1 tablet by mouth daily.   Yes [provider]  Multiple Vitamins-Minerals (EMERGEN-C IMMUNE PLUS/VIT D) CHEW Chew 3 each by mouth daily.   Yes [provider]  NARCAN 4 MG/0.1ML LIQD nasal spray kit Place 1 spray into the nose once. 05/26/20  Yes [provider]  omeprazole (PRILOSEC) 20 MG capsule Take 20 mg by mouth in the morning and at bedtime.    Yes [provider]  oxyCODONE-acetaminophen (PERCOCET) 10-325 MG tablet Take 1 tablet by mouth every 6 (six) hours as needed for pain. Patient taking differently: Take 1 tablet by mouth in the morning, at noon, in the evening, and at bedtime. 03/22/20  Yes Medina-Vargas, Monina C, NP  potassium chloride (KLOR-CON) 10 MEQ tablet Take 1 tablet (10 mEq total) by mouth daily. 03/24/20  Yes Medina-Vargas, Monina C, NP  protein supplement shake (PREMIER PROTEIN) LIQD Take 11 oz by mouth in the morning and at bedtime.   Yes [provider]  senna (SENOKOT) 8.6 MG TABS tablet Take 1 tablet by mouth 2 (two) times daily as needed (constipation.).    Yes [provider]  furosemide (LASIX) 20 MG tablet Take 2 tablets (40 mg total) by mouth daily. Patient not taking: No sig reported 03/24/20   Medina-Vargas, Monina C, NP       The MRI shows a large osteophyte at C4-5 and large spur off to the side at that level and there is canal compromise at the 4-5 level.  She fully understands the risks, having had this operation twice before.       Her exam is unchanged from her previous visit.  Alert, oriented by 4.  Speech is clear and fluent.  She walks with a walker.  Good strength in the upper extremities.  There is some weakness, essentially I can overcome her but there is no focal weakness.  She is still fairly myelopathic.  Reflexes are hyper reflexive 3 to 4+.  She weighs 223 pounds.  Temperature is 97.7, blood pressure 134/83, pulse 78.  Pain is  9/10.

## 2020-12-08 NOTE — Transfer of Care (Signed)
Immediate Anesthesia Transfer of Care Note  Patient: [name]  Procedure(s) Performed: Cervical Four-Five Anterior cervical decompression/discectomy/fusion (N/A Spine Cervical)  Patient Location: PACU  Anesthesia Type:General  Level of Consciousness: awake and alert   Airway & Oxygen Therapy: Patient Spontanous Breathing and Patient connected to face mask oxygen  Post-op Assessment: Report given to RN, Post -op Vital signs reviewed and stable, Patient moving all extremities X 4 and Patient able to stick tongue midline  Post vital signs: Reviewed and stable  Last Vitals:  Vitals Value Taken Time  BP 163/86 12/08/20 1655  Temp 36.6 C 12/08/20 1655  Pulse 80 12/08/20 1659  Resp 16 12/08/20 1659  SpO2 100 % 12/08/20 1659  Vitals shown include unvalidated device data.  Last Pain:  Vitals:   12/08/20 1655  TempSrc:   PainSc: 10-Worst pain ever      Patients Stated Pain Goal: 3 (12/08/20 1655)  Complications: No complications documented.

## 2020-12-08 NOTE — Progress Notes (Signed)
Dr. Stephannie Peters notified patient drank Premier Protein Shake at 0500am this morning.  She stated it would need to be 8 hours before they could proceed with surgery.  1300 would mark 8 hours.

## 2020-12-08 NOTE — Anesthesia Procedure Notes (Signed)
Procedure Name: Intubation Date/Time: 12/08/2020 2:06 PM Performed by: Lovie Chol, CRNA Pre-anesthesia Checklist: Patient identified, Emergency Drugs available, Suction available and Patient being monitored Patient Re-evaluated:Patient Re-evaluated prior to induction Oxygen Delivery Method: Circle System Utilized Preoxygenation: Pre-oxygenation with 100% oxygen Induction Type: IV induction Ventilation: Mask ventilation without difficulty and Oral airway inserted - appropriate to patient size Laryngoscope Size: Glidescope and 3 Grade View: Grade I Tube type: Oral Tube size: 7.0 mm Number of attempts: 1 Airway Equipment and Method: Stylet and Oral airway Placement Confirmation: ETT inserted through vocal cords under direct vision,  positive ETCO2 and breath sounds checked- equal and bilateral Secured at: 22 cm Tube secured with: Tape Dental Injury: Teeth and Oropharynx as per pre-operative assessment

## 2020-12-09 DIAGNOSIS — M4712 Other spondylosis with myelopathy, cervical region: Secondary | ICD-10-CM | POA: Diagnosis not present

## 2020-12-09 LAB — CBC
HCT: 36.6 % (ref 36.0–46.0)
Hemoglobin: 12.1 g/dL (ref 12.0–15.0)
MCH: 26.7 pg (ref 26.0–34.0)
MCHC: 33.1 g/dL (ref 30.0–36.0)
MCV: 80.8 fL (ref 80.0–100.0)
Platelets: 323 10*3/uL (ref 150–400)
RBC: 4.53 MIL/uL (ref 3.87–5.11)
RDW: 15.2 % (ref 11.5–15.5)
WBC: 15.2 10*3/uL — ABNORMAL HIGH (ref 4.0–10.5)
nRBC: 0 % (ref 0.0–0.2)

## 2020-12-09 LAB — CREATININE, SERUM
Creatinine, Ser: 0.59 mg/dL (ref 0.44–1.00)
GFR, Estimated: >60 mL/min (ref >60)

## 2020-12-09 MED ORDER — HYDROCHLOROTHIAZIDE 25 MG PO TABS
25.0000 mg | ORAL_TABLET | Freq: Every day | ORAL | Status: DC
Start: 2020-12-10 — End: 2020-12-09

## 2020-12-09 MED ORDER — PANTOPRAZOLE SODIUM 40 MG PO TBEC: 40.0000 mg | DELAYED_RELEASE_TABLET | Freq: Every day | ORAL | Status: DC

## 2020-12-09 MED ORDER — GABAPENTIN 600 MG PO TABS: 1200.0000 mg | ORAL_TABLET | Freq: Three times a day (TID) | ORAL | Status: DC

## 2020-12-09 MED ORDER — ASPIRIN EC 81 MG PO TBEC: 81.0000 mg | DELAYED_RELEASE_TABLET | Freq: Every day | ORAL | Status: DC

## 2020-12-09 MED ORDER — ATENOLOL 25 MG PO TABS: 50.0000 mg | ORAL_TABLET | Freq: Every day | ORAL | Status: DC

## 2020-12-09 MED ORDER — LUBIPROSTONE 24 MCG PO CAPS: 24.0000 ug | ORAL_CAPSULE | Freq: Every day | ORAL | Status: DC

## 2020-12-09 MED ORDER — POTASSIUM CHLORIDE CRYS ER 10 MEQ PO TBCR: 10.0000 meq | EXTENDED_RELEASE_TABLET | Freq: Every day | ORAL | Status: DC

## 2020-12-09 MED ORDER — SENNA 8.6 MG PO TABS: 1.0000 | ORAL_TABLET | Freq: Every day | ORAL | Status: DC

## 2020-12-09 MED ORDER — DIPHENHYDRAMINE HCL 25 MG PO CAPS
50.0000 mg | ORAL_CAPSULE | Freq: Three times a day (TID) | ORAL | Status: DC
Start: 2020-12-09 — End: 2020-12-09

## 2020-12-09 MED ORDER — CEPHALEXIN 500 MG PO CAPS: 500.0000 mg | ORAL_CAPSULE | Freq: Four times a day (QID) | ORAL | Status: DC

## 2020-12-09 MED ORDER — ADULT MULTIVITAMIN W/MINERALS CH: 1.0000 | ORAL_TABLET | Freq: Every day | ORAL | Status: DC

## 2020-12-09 MED ORDER — ASCORBIC ACID 500 MG PO TABS: 500.0000 mg | ORAL_TABLET | Freq: Every day | ORAL | Status: DC

## 2020-12-09 MED ORDER — DIAZEPAM 5 MG PO TABS
5.0000 mg | ORAL_TABLET | Freq: Two times a day (BID) | ORAL | Status: DC
  Administered 2020-12-09: 5 mg via ORAL
  Filled 2020-12-09: qty 1

## 2020-12-09 MED ORDER — CHLORZOXAZONE 500 MG PO TABS
500.0000 mg | ORAL_TABLET | Freq: Three times a day (TID) | ORAL | Status: DC
  Filled 2020-12-09: qty 1

## 2020-12-09 MED ORDER — HYDROXYZINE HCL 25 MG PO TABS: 50.0000 mg | ORAL_TABLET | Freq: Two times a day (BID) | ORAL | Status: DC

## 2020-12-09 NOTE — Discharge Instructions (Signed)
Wound Care Surgical wound is covered with Dermabond. Do not peel this off. It will come off on its own You are fine to shower. Let the water run over the surgical incision and pat dry with a clean towel. Do not put any creams, lotions, or ointments on incision.  Activity Walk each and every day, increasing distance each day. No lifting greater than 5 lbs.  Avoid excessive neck motion. No driving for 2 weeks; may ride as a passenger locally.  Diet Resume your normal diet.   Return to Work Will be discussed at your follow up appointment.  Call Your Doctor If Any of These Occur Redness, drainage, or swelling at the wound.  Temperature greater than 101 degrees. Severe pain not relieved by pain medication. Incision starts to come apart.  Follow Up Appt Call today for appointment in 1-2 weeks [phone]) or for problems.

## 2020-12-09 NOTE — Discharge Summary (Signed)
Physician Discharge Summary     Providing Compassionate, Quality Care - Together   Patient ID: [name] MRN: [id number] DOB/AGE: [birth year] 60 y.o.  Admit date: 12/08/2020 Discharge date: 12/09/2020  Admission Diagnoses: Cervical HNP  Discharge Diagnoses:  Active Problems:   HNP (herniated nucleus pulposus), cervical   Discharged Condition: good  Hospital Course: Patient was admitted to 3W16 following C4-5 ACDF by Dr. Christella Noa on 12/08/2020. The patient is tolerating food well. She is having no issues with swallowing or complaints of shortness of breath. She is ambulating independently. Her pain is well-controlled with oral pain medication. Her  incision is closed with Dermabond and is clean, dry, and intact. She is ready for discharge home.   Consults: None  Significant Diagnostic Studies: radiology: DG Cervical Spine 2-3 Views  Result Date: 12/08/2020 CLINICAL DATA:  C4-5 ACDF EXAM: CERVICAL SPINE - 2-3 VIEW COMPARISON:  11/20/2020 FINDINGS: Intraoperative spot images demonstrate anterior localizing instrument overlying the superior aspect of C5. Final image demonstrates anterior fusion changes at C4-5. Removal of the C3-4 plate previously seen. IMPRESSION: Intraoperative imaging as above. Electronically Signed   By: Rolm Baptise M.D.   On: 12/08/2020 16:58     Treatments: surgery:  Anterior Cervical decompression C4/5 Arthrodesis C4/5 with 57m structural allograft Anterior instrumentation(Globus) C4/5 Removal C3/4 plate and screws   Discharge Exam: Blood pressure 138/77, pulse 80, temperature 97.9 F (36.6 C), temperature source Oral, resp. rate 16, height 5' 3"  (1.6 m), weight 101.5 kg, last menstrual period 02/18/2016, SpO2 98 %.   Alert and oriented x 4 PERRLA CN II-XII grossly intact MAE, Strength and sensation intact Incision is covered with Dermabond and is clean, dry, and intact   Disposition: Discharge disposition: 01-Home or Self  Care       Discharge Instructions    Incentive spirometry RT   Complete by: As directed      Allergies as of 12/09/2020      Reactions   Monosodium Glutamate Anaphylaxis, Swelling   Eyes swollen shut, facial swelling, tongue swelling.   Shellfish Allergy Anaphylaxis   Celebrex [celecoxib] Itching   Only allergic to generic brand   Contrast Media [iodinated Diagnostic Agents] Itching, Nausea Only   "could not walk"   Diclofenac Itching   Generic Diclefenac gel causes itching. Can take the name brand Voltaren gel   Other Other (See Comments)   Pet dander, Mildew, Mold Cannot tolerate generic DICLOFENAC GEL--MUST USE BRAND NAME (GENERIC CAUSES ITCHING)   Latex Itching, Rash   Latex glove with powder      Medication List    TAKE these medications   acetaminophen 500 MG tablet Commonly known as: TYLENOL Take 1,500 mg by mouth every 8 (eight) hours as needed for moderate pain.   albuterol 108 (90 Base) MCG/ACT inhaler Commonly known as: VENTOLIN HFA Inhale 2 puffs into the lungs every 6 (six) hours as needed for wheezing or shortness of breath.   ascorbic acid 500 MG tablet Commonly known as: VITAMIN C Take 500 mg by mouth daily.   aspirin EC 81 MG tablet Take 81 mg by mouth daily.   atenolol 50 MG tablet Commonly known as: TENORMIN Take 1 tablet (50 mg total) by mouth daily.   CAL MAG ZINC +D3 PO Take 1 tablet by mouth in the morning, at noon, and at bedtime.   CeleBREX 200 MG capsule Generic drug: celecoxib Take 1 capsule (200 mg total) by mouth 2 (two) times daily.   cephALEXin 500 MG capsule Commonly  known as: KEFLEX Take 1 capsule (500 mg total) by mouth 4 (four) times daily.   chlorzoxazone 500 MG tablet Commonly known as: PARAFON Take 1 tablet (500 mg total) by mouth 3 (three) times daily.   cyclobenzaprine 10 MG tablet Commonly known as: FLEXERIL Take 10 mg by mouth 2 (two) times daily as needed for spasms.   diazepam 5 MG tablet Commonly known  as: VALIUM Take 1 tablet (5 mg total) by mouth 2 (two) times daily.   diclofenac 1.3 % Ptch Commonly known as: FLECTOR Place 1 patch onto the skin 2 (two) times daily as needed (pain.).   diphenhydrAMINE 25 MG tablet Commonly known as: BENADRYL Take 50 mg by mouth in the morning, at noon, in the evening, and at bedtime.   Emergen-C Immune Plus/Vit D Chew Chew 3 each by mouth daily.   furosemide 20 MG tablet Commonly known as: LASIX Take 2 tablets (40 mg total) by mouth daily.   gabapentin 600 MG tablet Commonly known as: NEURONTIN Take 2 tablets (1,200 mg total) by mouth 3 (three) times daily.   guaiFENesin 600 MG 12 hr tablet Commonly known as: MUCINEX Take 600 mg by mouth 2 (two) times daily as needed for cough.   hydrochlorothiazide 25 MG tablet Commonly known as: HYDRODIURIL Take 1 tablet (25 mg total) by mouth daily. One tablet by mouth in the morning.   hydrOXYzine 50 MG tablet Commonly known as: ATARAX/VISTARIL Take 50 mg by mouth 2 (two) times daily.   lubiprostone 24 MCG capsule Commonly known as: Amitiza Take 1 capsule (24 mcg total) by mouth 2 (two) times daily with a meal.   methocarbamol 500 MG tablet Commonly known as: ROBAXIN Take 1 tablet (500 mg total) by mouth 4 (four) times daily.   multivitamin with minerals Tabs tablet Take 1 tablet by mouth daily.   Narcan 4 MG/0.1ML Liqd nasal spray kit Generic drug: naloxone Place 1 spray into the nose once.   omeprazole 20 MG capsule Commonly known as: PRILOSEC Take 20 mg by mouth in the morning and at bedtime.   oxyCODONE-acetaminophen 10-325 MG tablet Commonly known as: PERCOCET Take 1 tablet by mouth every 6 (six) hours as needed for pain. What changed: when to take this   potassium chloride 10 MEQ tablet Commonly known as: KLOR-CON Take 1 tablet (10 mEq total) by mouth daily.   protein supplement shake Liqd Commonly known as: PREMIER PROTEIN Take 11 oz by mouth in the morning and at  bedtime.   senna 8.6 MG Tabs tablet Commonly known as: SENOKOT Take 1 tablet by mouth 2 (two) times daily as needed (constipation.).       Follow-up Information    Ashok Pall, MD. Schedule an appointment as soon as possible for a visit in 2 week(s).   Specialty: Neurosurgery Contact information: [address] [place] [address] [phone]               Signed: Viona Gilmore, DNP, AGNP-C Nurse Practitioner  [facility] [place] [address], [address], [place], [place] [address] P: [phone]    F: [phone]  12/09/2020, 10:08 AM

## 2020-12-09 NOTE — Progress Notes (Signed)
Occupational Therapy Evaluation   Patient evaluated by Occupational Therapy with no further acute OT needs identified. All education has been completed and the patient has no further questions. *all education completed.  See below for any follow-up Occupational Therapy or equipment needs. OT is signing off. Thank you for this referral.  No DME or follow up needs identified   12/09/20 1000  OT Visit Information  Last OT Received On 12/09/20  Assistance Needed +1  History of Present Illness 60 y/o female s/p ACDF C4-5 on 3/4. PMH: previous back surgeries, anemia, anxiety, fibromyalgia, GERD, HTN, hyperthyroidism, IBS  Precautions  Precautions Fall;Cervical  Precaution Booklet Issued Yes (comment)  Precaution Comments reviewed cervical precautions  Home Living  Family/patient expects to be discharged to: Private residence  Living Arrangements Alone  Available Help at Discharge Family;Available PRN/intermittently;Friend(s)  Type of Home House  Home Access Stairs to enter  Entrance Stairs-Number of Steps 5  Entrance Stairs-Rails Can reach both  Home Layout One level  Bathroom Shower/Tub Tub/shower unit  [employer] Handicapped height  Bathroom Accessibility Yes  Home Equipment [place] - single point;BSC;Shower seat;Walker - 2 wheels;Walker - 4 wheels;Adaptive equipment;Grab bars - tub/shower  Adaptive Equipment Reacher;Long-handled sponge  Additional Comments pt reports son will be with her until monday, and then friends will assist as needed  Prior Function  Level of Independence Independent with assistive device(s);Needs assistance  Gait / Transfers Assistance Needed Pt report she ambulates with rollator  ADL's / Homemaking Assistance Needed Pt reports she is mod I with ADLs using DME and AE  Communication  Communication No difficulties  Pain Assessment  Pain Assessment Faces  Faces Pain Scale 4  Pain Location neck and LB  Pain Descriptors / Indicators Discomfort;Operative site  guarding;Aching  Pain Intervention(s) Monitored during session  Cognition  Arousal/Alertness Awake/alert  Behavior During Therapy WFL for tasks assessed/performed  Overall Cognitive Status Within Functional Limits for tasks assessed  Upper Extremity Assessment  Upper Extremity Assessment Generalized weakness  Lower Extremity Assessment  Lower Extremity Assessment Defer to PT evaluation  Cervical / Trunk Assessment  Cervical / Trunk Assessment Other exceptions  Cervical / Trunk Exceptions scoliosis with L curvature  ADL  Overall ADL's  Needs assistance/impaired  Eating/Feeding Independent  Grooming Wash/dry hands;Wash/dry face;Oral care;Brushing hair;Supervision/safety;Standing  Grooming Details (indicate cue type and reason) reviewed safe technique for brushing teeth  Upper Body Bathing Set up;Supervision/ safety;Sitting  Lower Body Bathing Supervison/ safety;Sit to/from stand  Upper Body Dressing  Set up;Sitting  Lower Body Dressing Supervision/safety;Sit to/from Lawyer;Ambulation;Comfort height toilet;Grab bars  Toileting- Clothing Manipulation and Hygiene Supervision/safety;Sit to/from stand  Tub/ Engineer, structural Tub transfer;Supervision/safety;Shower seat;Grab Musician Details (indicate cue type and reason) simulated tub transfer  Functional mobility during ADLs Supervision/safety  General ADL Comments reviewed safety with IADLs  Bed Mobility  General bed mobility comments Pt sitting EOB  Transfers  Overall transfer level Modified independent  Balance  Overall balance assessment Mild deficits observed, not formally tested  General Comments  General comments (skin integrity, edema, etc.) pt is eager to discharge home  OT - End of Session  Activity Tolerance Patient tolerated treatment well  Patient left in bed;with call bell/phone within reach;Other (comment) (EOB with PT present)  Nurse Communication Mobility status  OT  Assessment  OT Recommendation/Assessment Patient does not need any further OT services  OT Visit Diagnosis Pain  Pain - part of body  (neck and back)  OT Problem List Decreased strength;Decreased activity  tolerance;Pain  AM-PAC OT "6 Clicks" Daily Activity Outcome Measure (Version 2)  Help from another person eating meals? 4  Help from another person taking care of personal grooming? 3  Help from another person toileting, which includes using toliet, bedpan, or urinal? 3  Help from another person bathing (including washing, rinsing, drying)? 3  Help from another person to put on and taking off regular upper body clothing? 3  Help from another person to put on and taking off regular lower body clothing? 3  6 Click Score 19  OT Recommendation  Follow Up Recommendations No OT follow up;Supervision - Intermittent  OT Equipment None recommended by OT  Acute Rehab OT Goals  Patient Stated Goal to go home today  OT Goal Formulation All assessment and education complete, DC therapy  OT Time Calculation  OT Start Time (ACUTE ONLY) 1014  OT Stop Time (ACUTE ONLY) 1035  OT Time Calculation (min) 21 min  OT General Charges  $OT Visit 1 Visit  OT Evaluation  $OT Eval Moderate Complexity 1 Mod  Written Expression  Dominant Hand Right  Eber Jones., OTR/L Acute Rehabilitation Services Pager [phone] Office [phone]

## 2020-12-09 NOTE — Plan of Care (Signed)

## 2020-12-09 NOTE — Anesthesia Postprocedure Evaluation (Signed)
Anesthesia Post Note  Patient: [name]  Procedure(s) Performed: Cervical Four-Five Anterior cervical decompression/discectomy/fusion (N/A Spine Cervical)     Patient location during evaluation: PACU Anesthesia Type: General Level of consciousness: awake and alert and oriented Pain management: pain level controlled Vital Signs Assessment: post-procedure vital signs reviewed and stable Respiratory status: spontaneous breathing, nonlabored ventilation and respiratory function stable Cardiovascular status: blood pressure returned to baseline Postop Assessment: no apparent nausea or vomiting Anesthetic complications: no   No complications documented.      Kaylyn Layer

## 2020-12-09 NOTE — Evaluation (Signed)
Physical Therapy Evaluation Patient Details Name: KEYANNA SANDEFER MRN: [id number] DOB: [birth year] Today's Date: 12/09/2020   History of Present Illness  60 y/o female s/p ACDF C4-5 on 3/4. PMH: previous back surgeries, anemia, anxiety, fibromyalgia, GERD, HTN, hyperthyroidism, IBS  Clinical Impression  PTA, patient lives alone and reports modI with rollator for mobility. Patient overall modI for mobility with RW. Education provided on cervical precautions and progressive walking program. No skilled PT needs required in acute setting. No PT follow up recommended at this time.     Follow Up Recommendations No PT follow up    Equipment Recommendations  None recommended by PT    Recommendations for Other Services       Precautions / Restrictions Precautions Precautions: Cervical;Fall Precaution Booklet Issued: Yes (comment) Required Braces or Orthoses:  (no brace needed per orders) Restrictions Weight Bearing Restrictions: No      Mobility  Bed Mobility               General bed mobility comments: Sitting EOB with OT on arrival    Transfers Overall transfer level: Modified independent Equipment used: Rolling [place] (2 wheeled)                Ambulation/Gait Ambulation/Gait assistance: Modified independent (Device/Increase time) Gait Distance (Feet): 300 Feet Assistive device: Rolling Ella Golomb (2 wheeled) Gait Pattern/deviations: Step-through pattern;Decreased stride length Gait velocity: decreased   General Gait Details: steady pace throughout with no rest break needed  Stairs Stairs: Yes Stairs assistance: Modified independent (Device/Increase time) Stair Management: Two rails;Step to pattern;Forwards Number of Stairs: 2    Wheelchair Mobility    Modified Rankin (Stroke Patients Only)       Balance Overall balance assessment: Mild deficits observed, not formally tested                                           Pertinent  Vitals/Pain Pain Assessment: Faces Faces Pain Scale: Hurts little more Pain Location: neck incision site Pain Descriptors / Indicators: Discomfort;Grimacing Pain Intervention(s): Monitored during session    Home Living Family/patient expects to be discharged to:: Private residence Living Arrangements: Alone Available Help at Discharge: Family;Available PRN/intermittently;Friend(s) Type of Home: House Home Access: Stairs to enter Entrance Stairs-Rails: Can reach both Entrance Stairs-Number of Steps: 5 Home Layout: One level Home Equipment: Cane - single point;Bedside commode;Shower seat;Warnie Belair - 2 wheels;Richad Ramsay - 4 wheels;Adaptive equipment;Grab bars - tub/shower      Prior Function Level of Independence: Independent with assistive device(s);Needs assistance   Gait / Transfers Assistance Needed: Pt report she ambulates with rollator  ADL's / Homemaking Assistance Needed: Pt reports she is mod I with ADLs using DME and AE        Hand Dominance        Extremity/Trunk Assessment   Upper Extremity Assessment Upper Extremity Assessment: Defer to OT evaluation    Lower Extremity Assessment Lower Extremity Assessment: Overall WFL for tasks assessed    Cervical / Trunk Assessment Cervical / Trunk Assessment: Other exceptions Cervical / Trunk Exceptions: scoliosis with L curvature  Communication   Communication: No difficulties  Cognition Arousal/Alertness: Awake/alert Behavior During Therapy: WFL for tasks assessed/performed Overall Cognitive Status: Within Functional Limits for tasks assessed  General Comments      Exercises     Assessment/Plan    PT Assessment Patent does not need any further PT services  PT Problem List         PT Treatment Interventions      PT Goals (Current goals can be found in the Care Plan section)  Acute Rehab PT Goals Patient Stated Goal: to go home PT Goal Formulation: With  patient    Frequency     Barriers to discharge        Co-evaluation               AM-PAC PT "6 Clicks" Mobility  Outcome Measure Help needed turning from your back to your side while in a flat bed without using bedrails?: None Help needed moving from lying on your back to sitting on the side of a flat bed without using bedrails?: None Help needed moving to and from a bed to a chair (including a wheelchair)?: None Help needed standing up from a chair using your arms (e.g., wheelchair or bedside chair)?: None Help needed to walk in hospital room?: None Help needed climbing 3-5 steps with a railing? : None 6 Click Score: 24    End of Session   Activity Tolerance: Patient tolerated treatment well Patient left: in bed;with call bell/phone within reach Nurse Communication: Mobility status PT Visit Diagnosis: Muscle weakness (generalized) (M62.81)    Time: 1030-1055 PT Time Calculation (min) (ACUTE ONLY): 25 min   Charges:   PT Evaluation $PT Eval Low Complexity: 1 Low          Brandi Tomlinson A. Dan Humphreys PT, DPT Acute Rehabilitation Services Pager [phone] Office [phone]   Viviann Spare 12/09/2020, 11:10 AM

## 2020-12-11 ENCOUNTER — Encounter (HOSPITAL_COMMUNITY): Payer: Self-pay | Admitting: Neurosurgery

## 2020-12-11 MED FILL — Thrombin (Recombinant) For Soln 5000 Unit: CUTANEOUS | Qty: 5000 | Status: AC

## 2021-01-11 ENCOUNTER — Ambulatory Visit: Payer: Medicare Other | Admitting: Internal Medicine

## 2021-02-16 ENCOUNTER — Encounter: Payer: Self-pay | Admitting: Internal Medicine

## 2021-02-16 ENCOUNTER — Ambulatory Visit (INDEPENDENT_AMBULATORY_CARE_PROVIDER_SITE_OTHER): Payer: Medicare Other | Admitting: Internal Medicine

## 2021-02-16 ENCOUNTER — Other Ambulatory Visit: Payer: Self-pay

## 2021-02-16 VITALS — BP 110/73 | HR 90 | Resp 16 | Ht 63.0 in | Wt 229.0 lb

## 2021-02-16 DIAGNOSIS — Z792 Long term (current) use of antibiotics: Secondary | ICD-10-CM

## 2021-02-16 DIAGNOSIS — T8142XD Infection following a procedure, deep incisional surgical site, subsequent encounter: Secondary | ICD-10-CM | POA: Diagnosis not present

## 2021-02-16 DIAGNOSIS — L819 Disorder of pigmentation, unspecified: Secondary | ICD-10-CM

## 2021-02-16 NOTE — Patient Instructions (Signed)
differin - "brand" - for hyperpigmentation - 2% hydroquinone  Vitamin c eye cream (l'oreal)

## 2021-02-16 NOTE — Progress Notes (Signed)
RFV: follow up for hardware infection  Patient ID: [name], female   DOB: [birth year], 60 y.o.   MRN: [id number]  HPI Anterior cervical decompression c3-c4 allograft plus removal of cervical screws. History of MSSA lumbar fusion surgical site infection may 2021 for which she is on chronic cephalexin. Over the last few months she has noticed increased facial hyperpigmentation worsening since January 2022.  Doing coconut oil, collagen, witch hazel, and egg whites without improvement. She has showed me comparison pictures.  She also was recently seen by her PCP and brings with her labs work, which I have reviewed.pertinent lab include WBC of - 13.8. she is usually at higher range looking back at her labs.  Back/neck pain improving though she is seen by pain management.  Outpatient Encounter Medications as of 02/16/2021  Medication Sig  . acetaminophen (TYLENOL) 500 MG tablet Take 1,500 mg by mouth every 8 (eight) hours as needed for moderate pain.  Marland Kitchen albuterol (VENTOLIN HFA) 108 (90 Base) MCG/ACT inhaler Inhale 2 puffs into the lungs every 6 (six) hours as needed for wheezing or shortness of breath.  Marland Kitchen ascorbic acid (VITAMIN C) 500 MG tablet Take 500 mg by mouth daily.  Marland Kitchen aspirin EC 81 MG tablet Take 81 mg by mouth daily.  Marland Kitchen atenolol (TENORMIN) 50 MG tablet Take 1 tablet (50 mg total) by mouth daily.  . CELEBREX 200 MG capsule Take 1 capsule (200 mg total) by mouth 2 (two) times daily.  . cephALEXin (KEFLEX) 500 MG capsule Take 1 capsule (500 mg total) by mouth 4 (four) times daily.  . chlorzoxazone (PARAFON) 500 MG tablet Take 1 tablet (500 mg total) by mouth 3 (three) times daily.  . cyclobenzaprine (FLEXERIL) 10 MG tablet Take 10 mg by mouth 2 (two) times daily as needed for spasms.  . diazepam (VALIUM) 5 MG tablet Take 1 tablet (5 mg total) by mouth 2 (two) times daily.  . diclofenac (FLECTOR) 1.3 % PTCH Place 1 patch onto the skin 2 (two) times daily as needed (pain.).  Marland Kitchen  diphenhydrAMINE (BENADRYL) 25 MG tablet Take 50 mg by mouth in the morning, at noon, in the evening, and at bedtime.  . furosemide (LASIX) 20 MG tablet Take 2 tablets (40 mg total) by mouth daily. (Patient not taking: No sig reported)  . gabapentin (NEURONTIN) 600 MG tablet Take 2 tablets (1,200 mg total) by mouth 3 (three) times daily.  Marland Kitchen guaiFENesin (MUCINEX) 600 MG 12 hr tablet Take 600 mg by mouth 2 (two) times daily as needed for cough.  . hydrochlorothiazide (HYDRODIURIL) 25 MG tablet Take 1 tablet (25 mg total) by mouth daily. One tablet by mouth in the morning.  . hydrOXYzine (ATARAX/VISTARIL) 50 MG tablet Take 50 mg by mouth 2 (two) times daily.  Marland Kitchen lubiprostone (AMITIZA) 24 MCG capsule Take 1 capsule (24 mcg total) by mouth 2 (two) times daily with a meal.  . methocarbamol (ROBAXIN) 500 MG tablet Take 1 tablet (500 mg total) by mouth 4 (four) times daily.  . Multiple Minerals-Vitamins (CAL MAG ZINC +D3 PO) Take 1 tablet by mouth in the morning, at noon, and at bedtime.  . Multiple Vitamin (MULTIVITAMIN WITH MINERALS) TABS tablet Take 1 tablet by mouth daily.  . Multiple Vitamins-Minerals (EMERGEN-C IMMUNE PLUS/VIT D) CHEW Chew 3 each by mouth daily.  Marland Kitchen NARCAN 4 MG/0.1ML LIQD nasal spray kit Place 1 spray into the nose once.  Marland Kitchen omeprazole (PRILOSEC) 20 MG capsule Take 20 mg by mouth in the morning  and at bedtime.   Marland Kitchen oxyCODONE-acetaminophen (PERCOCET) 10-325 MG tablet Take 1 tablet by mouth every 6 (six) hours as needed for pain. (Patient taking differently: Take 1 tablet by mouth in the morning, at noon, in the evening, and at bedtime.)  . potassium chloride (KLOR-CON) 10 MEQ tablet Take 1 tablet (10 mEq total) by mouth daily.  . protein supplement shake (PREMIER PROTEIN) LIQD Take 11 oz by mouth in the morning and at bedtime.  . senna (SENOKOT) 8.6 MG TABS tablet Take 1 tablet by mouth 2 (two) times daily as needed (constipation.).    No facility-administered encounter medications on file  as of 02/16/2021.     Patient Active Problem List   Diagnosis Date Noted  . HNP (herniated nucleus pulposus), cervical 12/08/2020  . Lumbago with sciatica, unspecified side 07/28/2020  . PICC (peripherally inserted central catheter) in place 03/16/2020  . Encounter for long-term (current) use of antibiotics 03/16/2020  . Polypharmacy 03/09/2020  . At risk for adverse drug event 03/09/2020  . MSSA bacteremia 02/29/2020  . Postoperative infection 02/28/2020  . Abnormal gait due to muscle weakness 02/26/2020  . HNP (herniated nucleus pulposus), lumbar 02/16/2020  . Muscle weakness of lower extremity 02/14/2020  . Spondylolisthesis of lumbar region 10/30/2017  . Painful orthopaedic hardware right ankle 05/27/2016  . Closed fracture of distal end of right fibula and tibia   . Ankle syndesmosis disruption   . ASCUS favor benign 01/06/2014  . Leg swelling 12/22/2013  . Seasonal allergies   . Anemia   . Chronic back pain due to DJD with history surgery   . Hyperthyroidism   . 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 Status post left total prosthetic replacement of knee joint using cement 03/24/2012  . Fibromyalgia   . GERD (gastroesophageal reflux disease)   . Anxiety   . Depression   . Hypertension   . Lumbar degenerative disc disease 09/25/2011     Health Maintenance Due  Topic Date Due  . Hepatitis C Screening  Never done  . COLONOSCOPY (Pts 45-12yr Insurance coverage will need to be confirmed)  Never done  . PAP SMEAR-Modifier  12/12/2018  . TETANUS/TDAP  05/08/2020     Review of Systems 12 point ros is negative except for arthritis, back pain, and cc: hyperpigmentation. Physical Exam   LMP 02/18/2016   Physical Exam  Constitutional:  oriented to person, place, and time. appears well-developed and well-nourished. No distress.  HENT: [place]/AT, PERRLA, no scleral icterus.dark circles beneath her eyes. Hyperpigmentation to cheeks  bilaterally Mouth/Throat: Oropharynx is clear and moist. No oropharyngeal exudate.  Cardiovascular: Normal rate, regular rhythm and normal heart sounds. Exam reveals no gallop and no friction rub.  No murmur heard.  Pulmonary/Chest: Effort normal and breath sounds normal. No respiratory distress.  has no wheezes.  Neck = supple, no nuchal rigidity Abdominal: Soft. Bowel sounds are normal.  exhibits no distension. There is no tenderness.  Lymphadenopathy: no cervical adenopathy. No axillary adenopathy Neurological: alert and oriented to person, place, and time.  Skin: Skin is warm and dry. No rash noted. No erythema.  Psychiatric: a normal mood and affect.  behavior is normal.   Lab Results  Component Value Date   LABRPR NON REAC 12/30/2013    CBC Lab Results  Component Value Date   WBC 15.2 (H) 12/09/2020   RBC 4.53 12/09/2020   HGB 12.1 12/09/2020   HCT 36.6 12/09/2020   PLT 323 12/09/2020   MCV  80.8 12/09/2020   MCH 26.7 12/09/2020   MCHC 33.1 12/09/2020   RDW 15.2 12/09/2020   LYMPHSABS 4,425 (H) 08/16/2020   MONOABS 1.3 (H) 03/02/2020   EOSABS 236 08/16/2020    BMET Lab Results  Component Value Date   NA 139 12/06/2020   K 3.5 12/06/2020   CL 103 12/06/2020   CO2 23 12/06/2020   GLUCOSE 104 (H) 12/06/2020   BUN 27 (H) 12/06/2020   CREATININE 0.59 12/09/2020   CALCIUM 9.9 12/06/2020   GFRNONAA >60 12/09/2020   GFRAA 90 03/22/2020      Assessment and Plan  MSSA hardware infection = has been on chronic suppression > 8 months excluding IV therapy treatment. We will observe off of abtx. Suspect she is completed treatment  Hyperpigmentation = possibly side effect of abtx or one of other medications she is taking. Will stop abtx to see if any relief. In addition, recommend to try 2% hydroxychloroquine for hyperpigmentation treatment  Will see back in 2 months Observe off of antibiotics

## 2021-02-26 ENCOUNTER — Other Ambulatory Visit: Payer: Self-pay | Admitting: Internal Medicine

## 2021-03-08 DIAGNOSIS — I10 Essential (primary) hypertension: Secondary | ICD-10-CM | POA: Diagnosis not present

## 2021-03-08 DIAGNOSIS — M4180 Other forms of scoliosis, site unspecified: Secondary | ICD-10-CM | POA: Diagnosis not present

## 2021-03-08 DIAGNOSIS — M5459 Other low back pain: Secondary | ICD-10-CM | POA: Diagnosis not present

## 2021-03-08 DIAGNOSIS — M797 Fibromyalgia: Secondary | ICD-10-CM | POA: Diagnosis not present

## 2021-03-12 DIAGNOSIS — M5459 Other low back pain: Secondary | ICD-10-CM | POA: Diagnosis not present

## 2021-03-12 DIAGNOSIS — M4180 Other forms of scoliosis, site unspecified: Secondary | ICD-10-CM | POA: Diagnosis not present

## 2021-03-14 DIAGNOSIS — M5459 Other low back pain: Secondary | ICD-10-CM | POA: Diagnosis not present

## 2021-03-14 DIAGNOSIS — M4180 Other forms of scoliosis, site unspecified: Secondary | ICD-10-CM | POA: Diagnosis not present

## 2021-03-20 DIAGNOSIS — M25562 Pain in left knee: Secondary | ICD-10-CM | POA: Diagnosis not present

## 2021-03-20 DIAGNOSIS — Z9181 History of falling: Secondary | ICD-10-CM | POA: Diagnosis not present

## 2021-03-20 DIAGNOSIS — M542 Cervicalgia: Secondary | ICD-10-CM | POA: Diagnosis not present

## 2021-03-20 DIAGNOSIS — M25561 Pain in right knee: Secondary | ICD-10-CM | POA: Diagnosis not present

## 2021-03-20 DIAGNOSIS — M797 Fibromyalgia: Secondary | ICD-10-CM | POA: Diagnosis not present

## 2021-03-20 DIAGNOSIS — Z79899 Other long term (current) drug therapy: Secondary | ICD-10-CM | POA: Diagnosis not present

## 2021-03-21 DIAGNOSIS — M5459 Other low back pain: Secondary | ICD-10-CM | POA: Diagnosis not present

## 2021-03-21 DIAGNOSIS — M4180 Other forms of scoliosis, site unspecified: Secondary | ICD-10-CM | POA: Diagnosis not present

## 2021-03-23 DIAGNOSIS — Z79899 Other long term (current) drug therapy: Secondary | ICD-10-CM | POA: Diagnosis not present

## 2021-03-23 DIAGNOSIS — M5459 Other low back pain: Secondary | ICD-10-CM | POA: Diagnosis not present

## 2021-03-23 DIAGNOSIS — M4180 Other forms of scoliosis, site unspecified: Secondary | ICD-10-CM | POA: Diagnosis not present

## 2021-03-28 DIAGNOSIS — M4180 Other forms of scoliosis, site unspecified: Secondary | ICD-10-CM | POA: Diagnosis not present

## 2021-03-28 DIAGNOSIS — M5459 Other low back pain: Secondary | ICD-10-CM | POA: Diagnosis not present

## 2021-04-04 DIAGNOSIS — M5459 Other low back pain: Secondary | ICD-10-CM | POA: Diagnosis not present

## 2021-04-04 DIAGNOSIS — M4180 Other forms of scoliosis, site unspecified: Secondary | ICD-10-CM | POA: Diagnosis not present

## 2021-04-06 DIAGNOSIS — M4180 Other forms of scoliosis, site unspecified: Secondary | ICD-10-CM | POA: Diagnosis not present

## 2021-04-06 DIAGNOSIS — M5459 Other low back pain: Secondary | ICD-10-CM | POA: Diagnosis not present

## 2021-04-17 DIAGNOSIS — M5459 Other low back pain: Secondary | ICD-10-CM | POA: Diagnosis not present

## 2021-04-17 DIAGNOSIS — M4180 Other forms of scoliosis, site unspecified: Secondary | ICD-10-CM | POA: Diagnosis not present

## 2021-04-18 ENCOUNTER — Ambulatory Visit: Payer: Medicare Other | Admitting: Internal Medicine

## 2021-04-18 DIAGNOSIS — M797 Fibromyalgia: Secondary | ICD-10-CM | POA: Diagnosis not present

## 2021-04-18 DIAGNOSIS — M25562 Pain in left knee: Secondary | ICD-10-CM | POA: Diagnosis not present

## 2021-04-18 DIAGNOSIS — Z79899 Other long term (current) drug therapy: Secondary | ICD-10-CM | POA: Diagnosis not present

## 2021-04-18 DIAGNOSIS — M25561 Pain in right knee: Secondary | ICD-10-CM | POA: Diagnosis not present

## 2021-04-18 DIAGNOSIS — F32A Depression, unspecified: Secondary | ICD-10-CM | POA: Diagnosis not present

## 2021-04-18 DIAGNOSIS — M542 Cervicalgia: Secondary | ICD-10-CM | POA: Diagnosis not present

## 2021-04-18 DIAGNOSIS — R300 Dysuria: Secondary | ICD-10-CM | POA: Diagnosis not present

## 2021-04-18 DIAGNOSIS — Z9181 History of falling: Secondary | ICD-10-CM | POA: Diagnosis not present

## 2021-04-20 DIAGNOSIS — M5459 Other low back pain: Secondary | ICD-10-CM | POA: Diagnosis not present

## 2021-04-20 DIAGNOSIS — M4180 Other forms of scoliosis, site unspecified: Secondary | ICD-10-CM | POA: Diagnosis not present

## 2021-04-24 DIAGNOSIS — M5459 Other low back pain: Secondary | ICD-10-CM | POA: Diagnosis not present

## 2021-04-24 DIAGNOSIS — M4180 Other forms of scoliosis, site unspecified: Secondary | ICD-10-CM | POA: Diagnosis not present

## 2021-04-26 DIAGNOSIS — M5459 Other low back pain: Secondary | ICD-10-CM | POA: Diagnosis not present

## 2021-04-26 DIAGNOSIS — M4180 Other forms of scoliosis, site unspecified: Secondary | ICD-10-CM | POA: Diagnosis not present

## 2021-04-30 DIAGNOSIS — M5459 Other low back pain: Secondary | ICD-10-CM | POA: Diagnosis not present

## 2021-04-30 DIAGNOSIS — M4180 Other forms of scoliosis, site unspecified: Secondary | ICD-10-CM | POA: Diagnosis not present

## 2021-05-03 DIAGNOSIS — M4180 Other forms of scoliosis, site unspecified: Secondary | ICD-10-CM | POA: Diagnosis not present

## 2021-05-03 DIAGNOSIS — M5459 Other low back pain: Secondary | ICD-10-CM | POA: Diagnosis not present

## 2021-05-07 DIAGNOSIS — M5459 Other low back pain: Secondary | ICD-10-CM | POA: Diagnosis not present

## 2021-05-07 DIAGNOSIS — M4180 Other forms of scoliosis, site unspecified: Secondary | ICD-10-CM | POA: Diagnosis not present

## 2021-05-10 DIAGNOSIS — M4180 Other forms of scoliosis, site unspecified: Secondary | ICD-10-CM | POA: Diagnosis not present

## 2021-05-10 DIAGNOSIS — M5459 Other low back pain: Secondary | ICD-10-CM | POA: Diagnosis not present

## 2021-05-17 DIAGNOSIS — Z79899 Other long term (current) drug therapy: Secondary | ICD-10-CM | POA: Diagnosis not present

## 2021-05-17 DIAGNOSIS — M4180 Other forms of scoliosis, site unspecified: Secondary | ICD-10-CM | POA: Diagnosis not present

## 2021-05-17 DIAGNOSIS — M25561 Pain in right knee: Secondary | ICD-10-CM | POA: Diagnosis not present

## 2021-05-17 DIAGNOSIS — M5459 Other low back pain: Secondary | ICD-10-CM | POA: Diagnosis not present

## 2021-05-17 DIAGNOSIS — M542 Cervicalgia: Secondary | ICD-10-CM | POA: Diagnosis not present

## 2021-05-17 DIAGNOSIS — M797 Fibromyalgia: Secondary | ICD-10-CM | POA: Diagnosis not present

## 2021-05-17 DIAGNOSIS — Z9181 History of falling: Secondary | ICD-10-CM | POA: Diagnosis not present

## 2021-05-17 DIAGNOSIS — F32A Depression, unspecified: Secondary | ICD-10-CM | POA: Diagnosis not present

## 2021-05-17 DIAGNOSIS — M25562 Pain in left knee: Secondary | ICD-10-CM | POA: Diagnosis not present

## 2021-05-21 DIAGNOSIS — Z79899 Other long term (current) drug therapy: Secondary | ICD-10-CM | POA: Diagnosis not present

## 2021-05-22 DIAGNOSIS — M4180 Other forms of scoliosis, site unspecified: Secondary | ICD-10-CM | POA: Diagnosis not present

## 2021-05-22 DIAGNOSIS — M5459 Other low back pain: Secondary | ICD-10-CM | POA: Diagnosis not present

## 2021-05-24 DIAGNOSIS — M5459 Other low back pain: Secondary | ICD-10-CM | POA: Diagnosis not present

## 2021-05-24 DIAGNOSIS — M4180 Other forms of scoliosis, site unspecified: Secondary | ICD-10-CM | POA: Diagnosis not present

## 2021-05-29 DIAGNOSIS — M5459 Other low back pain: Secondary | ICD-10-CM | POA: Diagnosis not present

## 2021-05-29 DIAGNOSIS — M4180 Other forms of scoliosis, site unspecified: Secondary | ICD-10-CM | POA: Diagnosis not present

## 2021-05-31 DIAGNOSIS — M4180 Other forms of scoliosis, site unspecified: Secondary | ICD-10-CM | POA: Diagnosis not present

## 2021-05-31 DIAGNOSIS — M5459 Other low back pain: Secondary | ICD-10-CM | POA: Diagnosis not present

## 2021-06-05 DIAGNOSIS — J029 Acute pharyngitis, unspecified: Secondary | ICD-10-CM | POA: Diagnosis not present

## 2021-06-05 DIAGNOSIS — M7918 Myalgia, other site: Secondary | ICD-10-CM | POA: Diagnosis not present

## 2021-06-07 ENCOUNTER — Encounter: Payer: Self-pay | Admitting: Internal Medicine

## 2021-06-07 ENCOUNTER — Ambulatory Visit (INDEPENDENT_AMBULATORY_CARE_PROVIDER_SITE_OTHER): Payer: Medicare Other | Admitting: Internal Medicine

## 2021-06-07 ENCOUNTER — Other Ambulatory Visit: Payer: Self-pay

## 2021-06-07 VITALS — BP 102/71 | HR 75 | Resp 16 | Ht 63.0 in | Wt 227.0 lb

## 2021-06-07 DIAGNOSIS — B9561 Methicillin susceptible Staphylococcus aureus infection as the cause of diseases classified elsewhere: Secondary | ICD-10-CM | POA: Diagnosis not present

## 2021-06-07 DIAGNOSIS — R519 Headache, unspecified: Secondary | ICD-10-CM

## 2021-06-07 DIAGNOSIS — T8142XD Infection following a procedure, deep incisional surgical site, subsequent encounter: Secondary | ICD-10-CM

## 2021-06-07 DIAGNOSIS — R7881 Bacteremia: Secondary | ICD-10-CM

## 2021-06-07 NOTE — Progress Notes (Signed)
RFV: follow up for chronic MSSA HW spinal infection  Patient ID: [name], female   DOB: 28-May-1961, 60 y.o.   MRN: [id number]  HPI  [name] had lumbar fusion complicated by infection on 02/27/20 epidural abscess 2/2 HW infection has been treated with IV abtx and was on chronic suppression for 1 year with cephalexin up until May 2022. In early march, she had surgery for cord compression at C4/5 s/p acdf C4/5 by dr Christella Noa. She stills has neuropathy to uper extremities despite decompression. She is followed up in pain management NP for muscle relaxant (1.5 tabs TID-QID) also on max doses of gabapentin which provides pain relief as well and also sees dr Alroy Dust as her PCP.   She follows up with dr Christella Noa on 9/12th.  She has noticed in the last 3 weeks where she has intermittent Left sided pain from neck to occiput and radiated to vertex. More left side and migrating up scalp. With scalp sensitivity+  Outpatient Encounter Medications as of 06/07/2021  Medication Sig   acetaminophen (TYLENOL) 500 MG tablet Take 1,500 mg by mouth every 8 (eight) hours as needed for moderate pain.   albuterol (VENTOLIN HFA) 108 (90 Base) MCG/ACT inhaler Inhale 2 puffs into the lungs every 6 (six) hours as needed for wheezing or shortness of breath.   ascorbic acid (VITAMIN C) 500 MG tablet Take 500 mg by mouth daily.   aspirin EC 81 MG tablet Take 81 mg by mouth daily.   atenolol (TENORMIN) 50 MG tablet Take 1 tablet (50 mg total) by mouth daily.   CELEBREX 200 MG capsule Take 1 capsule (200 mg total) by mouth 2 (two) times daily.   cephALEXin (KEFLEX) 500 MG capsule Take 1 capsule (500 mg total) by mouth 4 (four) times daily.   chlorzoxazone (PARAFON) 500 MG tablet Take 1 tablet (500 mg total) by mouth 3 (three) times daily.   cyclobenzaprine (FLEXERIL) 10 MG tablet Take 10 mg by mouth 2 (two) times daily as needed for spasms.   diazepam (VALIUM) 5 MG tablet Take 1 tablet (5 mg total) by mouth 2 (two)  times daily.   diclofenac (FLECTOR) 1.3 % PTCH Place 1 patch onto the skin 2 (two) times daily as needed (pain.).   diphenhydrAMINE (BENADRYL) 25 MG tablet Take 50 mg by mouth in the morning, at noon, in the evening, and at bedtime.   furosemide (LASIX) 20 MG tablet Take 2 tablets (40 mg total) by mouth daily. (Patient not taking: No sig reported)   gabapentin (NEURONTIN) 600 MG tablet Take 2 tablets (1,200 mg total) by mouth 3 (three) times daily.   guaiFENesin (MUCINEX) 600 MG 12 hr tablet Take 600 mg by mouth 2 (two) times daily as needed for cough.   hydrochlorothiazide (HYDRODIURIL) 25 MG tablet Take 1 tablet (25 mg total) by mouth daily. One tablet by mouth in the morning.   hydrOXYzine (ATARAX/VISTARIL) 50 MG tablet Take 50 mg by mouth 2 (two) times daily.   lubiprostone (AMITIZA) 24 MCG capsule Take 1 capsule (24 mcg total) by mouth 2 (two) times daily with a meal.   methocarbamol (ROBAXIN) 500 MG tablet Take 1 tablet (500 mg total) by mouth 4 (four) times daily.   Multiple Minerals-Vitamins (CAL MAG ZINC +D3 PO) Take 1 tablet by mouth in the morning, at noon, and at bedtime.   Multiple Vitamin (MULTIVITAMIN WITH MINERALS) TABS tablet Take 1 tablet by mouth daily.   Multiple Vitamins-Minerals (EMERGEN-C IMMUNE PLUS/VIT D) CHEW Chew 3  each by mouth daily.   NARCAN 4 MG/0.1ML LIQD nasal spray kit Place 1 spray into the nose once.   omeprazole (PRILOSEC) 20 MG capsule Take 20 mg by mouth in the morning and at bedtime.    oxyCODONE-acetaminophen (PERCOCET) 10-325 MG tablet Take 1 tablet by mouth every 6 (six) hours as needed for pain. (Patient taking differently: Take 1 tablet by mouth in the morning, at noon, in the evening, and at bedtime.)   potassium chloride (KLOR-CON) 10 MEQ tablet Take 1 tablet (10 mEq total) by mouth daily.   protein supplement shake (PREMIER PROTEIN) LIQD Take 11 oz by mouth in the morning and at bedtime.   senna (SENOKOT) 8.6 MG TABS tablet Take 1 tablet by mouth 2  (two) times daily as needed (constipation.).    No facility-administered encounter medications on file as of 06/07/2021.     Patient Active Problem List   Diagnosis Date Noted   HNP (herniated nucleus pulposus), cervical 12/08/2020   Lumbago with sciatica, unspecified side 07/28/2020   PICC (peripherally inserted central catheter) in place 03/16/2020   Encounter for long-term (current) use of antibiotics 03/16/2020   Polypharmacy 03/09/2020   At risk for adverse drug event 03/09/2020   MSSA bacteremia 02/29/2020   Postoperative infection 02/28/2020   Abnormal gait due to muscle weakness 02/26/2020   HNP (herniated nucleus pulposus), lumbar 02/16/2020   Muscle weakness of lower extremity 02/14/2020   Spondylolisthesis of lumbar region 10/30/2017   Painful orthopaedic hardware right ankle 05/27/2016   Closed fracture of distal end of right fibula and tibia    Ankle syndesmosis disruption    ASCUS favor benign 01/06/2014   Leg swelling 12/22/2013   Seasonal allergies    Anemia    Chronic back pain due to DJD with history surgery    Hyperthyroidism    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 03/24/2012   Status post left total prosthetic replacement of knee joint using cement 03/24/2012   Fibromyalgia    GERD (gastroesophageal reflux disease)    Anxiety    Depression    Hypertension    Lumbar degenerative disc disease 09/25/2011     Health Maintenance Due  Topic Date Due   Pneumococcal Vaccine 70-66 Years old (1 - PCV) Never done   Hepatitis C Screening  Never done   Zoster Vaccines- Shingrix (1 of 2) Never done   COLONOSCOPY (Pts 45-60yr Insurance coverage will need to be confirmed)  Never done   PAP SMEAR-Modifier  12/12/2018   TETANUS/TDAP  05/08/2020   COVID-19 Vaccine (4 - Booster for Pfizer series) 09/16/2020   INFLUENZA VACCINE  05/07/2021     Review of Systems  Physical Exam  BP 102/71   Pulse 75   Resp 16   Ht 5' 3" (1.6 m)   Wt 227 lb (103  kg)   LMP 02/18/2016   SpO2 98%   BMI 40.21 kg/m  Physical Exam  Constitutional:  oriented to person, place, and time. appears well-developed and well-nourished. No distress.  HENT: [place]/AT, PERRLA, no scleral icterus Mouth/Throat: Oropharynx is clear and moist. No oropharyngeal exudate.  Cardiovascular: Normal rate, regular rhythm and normal heart sounds. Exam reveals no gallop and no friction rub.  No murmur heard.  Pulmonary/Chest: Effort normal and breath sounds normal. No respiratory distress.  has no wheezes.  Neck = supple, no nuchal rigidity Abdominal: Soft. Bowel sounds are normal.  exhibits no distension. There is no tenderness.  Ext: + clubbing bilaterally Neurological:  alert and oriented to person, place, and time.  Skin: Skin is warm and dry. No rash noted. No erythema.  Psychiatric: a normal mood and affect.  behavior is normal.   Lab Results  Component Value Date   LABRPR NON REAC 12/30/2013    CBC Lab Results  Component Value Date   WBC 15.2 (H) 12/09/2020   RBC 4.53 12/09/2020   HGB 12.1 12/09/2020   HCT 36.6 12/09/2020   PLT 323 12/09/2020   MCV 80.8 12/09/2020   MCH 26.7 12/09/2020   MCHC 33.1 12/09/2020   RDW 15.2 12/09/2020   LYMPHSABS 4,425 (H) 08/16/2020   MONOABS 1.3 (H) 03/02/2020   EOSABS 236 08/16/2020    BMET Lab Results  Component Value Date   NA 139 12/06/2020   K 3.5 12/06/2020   CL 103 12/06/2020   CO2 23 12/06/2020   GLUCOSE 104 (H) 12/06/2020   BUN 27 (H) 12/06/2020   CREATININE 0.59 12/09/2020   CALCIUM 9.9 12/06/2020   GFRNONAA >60 12/09/2020   GFRAA 90 03/22/2020    Assessment and Plan  MSSA HW infection of spine, previously treated = now has off of abtx since late may. Noticing no different since off of abtx.   Scalp pain = unclear if this atypical presentation of HA. She is on high dose gabapentin and muscle relaxant may/may not help.no rash noted to think this is shingles. Symptoms improving with benadryl as needed.  Continue to monitor if it worsens.  Health maintenance = recommend covid booster and flu vaccine in the coming weeks

## 2021-06-12 DIAGNOSIS — M5459 Other low back pain: Secondary | ICD-10-CM | POA: Diagnosis not present

## 2021-06-12 DIAGNOSIS — M4180 Other forms of scoliosis, site unspecified: Secondary | ICD-10-CM | POA: Diagnosis not present

## 2021-06-18 DIAGNOSIS — M25561 Pain in right knee: Secondary | ICD-10-CM | POA: Diagnosis not present

## 2021-06-18 DIAGNOSIS — Z79899 Other long term (current) drug therapy: Secondary | ICD-10-CM | POA: Diagnosis not present

## 2021-06-18 DIAGNOSIS — Z9181 History of falling: Secondary | ICD-10-CM | POA: Diagnosis not present

## 2021-06-18 DIAGNOSIS — M542 Cervicalgia: Secondary | ICD-10-CM | POA: Diagnosis not present

## 2021-06-18 DIAGNOSIS — M25562 Pain in left knee: Secondary | ICD-10-CM | POA: Diagnosis not present

## 2021-06-18 DIAGNOSIS — M797 Fibromyalgia: Secondary | ICD-10-CM | POA: Diagnosis not present

## 2021-06-18 DIAGNOSIS — F32A Depression, unspecified: Secondary | ICD-10-CM | POA: Diagnosis not present

## 2021-06-19 DIAGNOSIS — M4180 Other forms of scoliosis, site unspecified: Secondary | ICD-10-CM | POA: Diagnosis not present

## 2021-06-19 DIAGNOSIS — M5459 Other low back pain: Secondary | ICD-10-CM | POA: Diagnosis not present

## 2021-06-20 DIAGNOSIS — Z79899 Other long term (current) drug therapy: Secondary | ICD-10-CM | POA: Diagnosis not present

## 2021-06-22 DIAGNOSIS — M5459 Other low back pain: Secondary | ICD-10-CM | POA: Diagnosis not present

## 2021-06-22 DIAGNOSIS — M4180 Other forms of scoliosis, site unspecified: Secondary | ICD-10-CM | POA: Diagnosis not present

## 2021-06-26 DIAGNOSIS — M5459 Other low back pain: Secondary | ICD-10-CM | POA: Diagnosis not present

## 2021-06-26 DIAGNOSIS — M4180 Other forms of scoliosis, site unspecified: Secondary | ICD-10-CM | POA: Diagnosis not present

## 2021-06-28 DIAGNOSIS — M4712 Other spondylosis with myelopathy, cervical region: Secondary | ICD-10-CM | POA: Diagnosis not present

## 2021-06-28 DIAGNOSIS — I10 Essential (primary) hypertension: Secondary | ICD-10-CM | POA: Diagnosis not present

## 2021-06-28 DIAGNOSIS — M4180 Other forms of scoliosis, site unspecified: Secondary | ICD-10-CM | POA: Diagnosis not present

## 2021-07-09 DIAGNOSIS — R197 Diarrhea, unspecified: Secondary | ICD-10-CM | POA: Diagnosis not present

## 2021-07-09 DIAGNOSIS — U071 COVID-19: Secondary | ICD-10-CM | POA: Diagnosis not present

## 2021-07-17 DIAGNOSIS — M25562 Pain in left knee: Secondary | ICD-10-CM | POA: Diagnosis not present

## 2021-07-17 DIAGNOSIS — M25561 Pain in right knee: Secondary | ICD-10-CM | POA: Diagnosis not present

## 2021-07-17 DIAGNOSIS — Z79899 Other long term (current) drug therapy: Secondary | ICD-10-CM | POA: Diagnosis not present

## 2021-07-17 DIAGNOSIS — R051 Acute cough: Secondary | ICD-10-CM | POA: Diagnosis not present

## 2021-07-17 DIAGNOSIS — M797 Fibromyalgia: Secondary | ICD-10-CM | POA: Diagnosis not present

## 2021-07-17 DIAGNOSIS — F32A Depression, unspecified: Secondary | ICD-10-CM | POA: Diagnosis not present

## 2021-07-17 DIAGNOSIS — M542 Cervicalgia: Secondary | ICD-10-CM | POA: Diagnosis not present

## 2021-07-19 DIAGNOSIS — Z79899 Other long term (current) drug therapy: Secondary | ICD-10-CM | POA: Diagnosis not present

## 2021-07-25 ENCOUNTER — Other Ambulatory Visit: Payer: Self-pay | Admitting: Neurosurgery

## 2021-07-25 DIAGNOSIS — M4712 Other spondylosis with myelopathy, cervical region: Secondary | ICD-10-CM

## 2021-07-26 DIAGNOSIS — E7800 Pure hypercholesterolemia, unspecified: Secondary | ICD-10-CM | POA: Diagnosis not present

## 2021-07-26 DIAGNOSIS — K219 Gastro-esophageal reflux disease without esophagitis: Secondary | ICD-10-CM | POA: Diagnosis not present

## 2021-07-26 DIAGNOSIS — Z0000 Encounter for general adult medical examination without abnormal findings: Secondary | ICD-10-CM | POA: Diagnosis not present

## 2021-07-26 DIAGNOSIS — I10 Essential (primary) hypertension: Secondary | ICD-10-CM | POA: Diagnosis not present

## 2021-07-26 DIAGNOSIS — I700 Atherosclerosis of aorta: Secondary | ICD-10-CM | POA: Diagnosis not present

## 2021-07-26 DIAGNOSIS — M542 Cervicalgia: Secondary | ICD-10-CM | POA: Diagnosis not present

## 2021-07-26 DIAGNOSIS — M797 Fibromyalgia: Secondary | ICD-10-CM | POA: Diagnosis not present

## 2021-07-26 DIAGNOSIS — J45909 Unspecified asthma, uncomplicated: Secondary | ICD-10-CM | POA: Diagnosis not present

## 2021-07-26 DIAGNOSIS — Z23 Encounter for immunization: Secondary | ICD-10-CM | POA: Diagnosis not present

## 2021-08-17 DIAGNOSIS — Z9181 History of falling: Secondary | ICD-10-CM | POA: Diagnosis not present

## 2021-08-17 DIAGNOSIS — M542 Cervicalgia: Secondary | ICD-10-CM | POA: Diagnosis not present

## 2021-08-17 DIAGNOSIS — M25562 Pain in left knee: Secondary | ICD-10-CM | POA: Diagnosis not present

## 2021-08-17 DIAGNOSIS — M25561 Pain in right knee: Secondary | ICD-10-CM | POA: Diagnosis not present

## 2021-08-17 DIAGNOSIS — M797 Fibromyalgia: Secondary | ICD-10-CM | POA: Diagnosis not present

## 2021-08-17 DIAGNOSIS — F32A Depression, unspecified: Secondary | ICD-10-CM | POA: Diagnosis not present

## 2021-08-17 DIAGNOSIS — Z79899 Other long term (current) drug therapy: Secondary | ICD-10-CM | POA: Diagnosis not present

## 2021-08-21 DIAGNOSIS — Z79899 Other long term (current) drug therapy: Secondary | ICD-10-CM | POA: Diagnosis not present

## 2021-08-27 ENCOUNTER — Ambulatory Visit
Admission: RE | Admit: 2021-08-27 | Discharge: 2021-08-27 | Disposition: A | Discharge status: Home / Self Care | Payer: Medicare Other | Source: Ambulatory Visit | Attending: Neurosurgery | Admitting: Neurosurgery

## 2021-08-27 ENCOUNTER — Other Ambulatory Visit: Payer: Self-pay

## 2021-08-27 DIAGNOSIS — M4802 Spinal stenosis, cervical region: Secondary | ICD-10-CM | POA: Diagnosis not present

## 2021-08-27 DIAGNOSIS — M4712 Other spondylosis with myelopathy, cervical region: Secondary | ICD-10-CM

## 2021-09-03 DIAGNOSIS — Z791 Long term (current) use of non-steroidal anti-inflammatories (NSAID): Secondary | ICD-10-CM | POA: Diagnosis not present

## 2021-09-03 DIAGNOSIS — R748 Abnormal levels of other serum enzymes: Secondary | ICD-10-CM | POA: Diagnosis not present

## 2021-09-10 ENCOUNTER — Other Ambulatory Visit: Payer: Self-pay

## 2021-09-10 DIAGNOSIS — R7989 Other specified abnormal findings of blood chemistry: Secondary | ICD-10-CM

## 2021-09-17 DIAGNOSIS — M542 Cervicalgia: Secondary | ICD-10-CM | POA: Diagnosis not present

## 2021-09-17 DIAGNOSIS — I10 Essential (primary) hypertension: Secondary | ICD-10-CM | POA: Diagnosis not present

## 2021-09-17 DIAGNOSIS — M4712 Other spondylosis with myelopathy, cervical region: Secondary | ICD-10-CM | POA: Diagnosis not present

## 2021-09-17 DIAGNOSIS — M25562 Pain in left knee: Secondary | ICD-10-CM | POA: Diagnosis not present

## 2021-09-17 DIAGNOSIS — M797 Fibromyalgia: Secondary | ICD-10-CM | POA: Diagnosis not present

## 2021-09-17 DIAGNOSIS — F32A Depression, unspecified: Secondary | ICD-10-CM | POA: Diagnosis not present

## 2021-09-17 DIAGNOSIS — Z0000 Encounter for general adult medical examination without abnormal findings: Secondary | ICD-10-CM | POA: Diagnosis not present

## 2021-09-17 DIAGNOSIS — Z79899 Other long term (current) drug therapy: Secondary | ICD-10-CM | POA: Diagnosis not present

## 2021-09-17 DIAGNOSIS — M4180 Other forms of scoliosis, site unspecified: Secondary | ICD-10-CM | POA: Diagnosis not present

## 2021-09-17 DIAGNOSIS — Z9181 History of falling: Secondary | ICD-10-CM | POA: Diagnosis not present

## 2021-09-17 DIAGNOSIS — M25561 Pain in right knee: Secondary | ICD-10-CM | POA: Diagnosis not present

## 2021-09-17 DIAGNOSIS — M48062 Spinal stenosis, lumbar region with neurogenic claudication: Secondary | ICD-10-CM | POA: Diagnosis not present

## 2021-09-18 ENCOUNTER — Other Ambulatory Visit: Payer: Self-pay | Admitting: Neurosurgery

## 2021-09-18 DIAGNOSIS — M4180 Other forms of scoliosis, site unspecified: Secondary | ICD-10-CM

## 2021-09-19 DIAGNOSIS — Z79899 Other long term (current) drug therapy: Secondary | ICD-10-CM | POA: Diagnosis not present

## 2021-09-20 ENCOUNTER — Ambulatory Visit
Admission: RE | Admit: 2021-09-20 | Discharge: 2021-09-20 | Disposition: A | Discharge status: Home / Self Care | Payer: Medicare Other | Source: Ambulatory Visit | Attending: Family Medicine | Admitting: Family Medicine

## 2021-09-20 ENCOUNTER — Other Ambulatory Visit: Payer: Self-pay

## 2021-09-20 DIAGNOSIS — R7989 Other specified abnormal findings of blood chemistry: Secondary | ICD-10-CM | POA: Diagnosis not present

## 2021-10-07 HISTORY — PX: EYE SURGERY: SHX253

## 2021-10-12 ENCOUNTER — Other Ambulatory Visit: Payer: Self-pay

## 2021-10-12 ENCOUNTER — Ambulatory Visit
Admission: RE | Admit: 2021-10-12 | Discharge: 2021-10-12 | Disposition: A | Discharge status: Home / Self Care | Payer: Medicare Other | Source: Ambulatory Visit | Attending: Neurosurgery | Admitting: Neurosurgery

## 2021-10-12 DIAGNOSIS — M4180 Other forms of scoliosis, site unspecified: Secondary | ICD-10-CM

## 2021-10-12 DIAGNOSIS — M545 Low back pain, unspecified: Secondary | ICD-10-CM | POA: Diagnosis not present

## 2021-10-12 DIAGNOSIS — M419 Scoliosis, unspecified: Secondary | ICD-10-CM | POA: Diagnosis not present

## 2021-10-18 DIAGNOSIS — Z131 Encounter for screening for diabetes mellitus: Secondary | ICD-10-CM | POA: Diagnosis not present

## 2021-10-18 DIAGNOSIS — F32A Depression, unspecified: Secondary | ICD-10-CM | POA: Diagnosis not present

## 2021-10-18 DIAGNOSIS — M797 Fibromyalgia: Secondary | ICD-10-CM | POA: Diagnosis not present

## 2021-10-18 DIAGNOSIS — Z9181 History of falling: Secondary | ICD-10-CM | POA: Diagnosis not present

## 2021-10-18 DIAGNOSIS — M25561 Pain in right knee: Secondary | ICD-10-CM | POA: Diagnosis not present

## 2021-10-18 DIAGNOSIS — Z79899 Other long term (current) drug therapy: Secondary | ICD-10-CM | POA: Diagnosis not present

## 2021-10-18 DIAGNOSIS — Z0000 Encounter for general adult medical examination without abnormal findings: Secondary | ICD-10-CM | POA: Diagnosis not present

## 2021-10-18 DIAGNOSIS — M542 Cervicalgia: Secondary | ICD-10-CM | POA: Diagnosis not present

## 2021-10-18 DIAGNOSIS — M25562 Pain in left knee: Secondary | ICD-10-CM | POA: Diagnosis not present

## 2021-10-22 DIAGNOSIS — Z79899 Other long term (current) drug therapy: Secondary | ICD-10-CM | POA: Diagnosis not present

## 2021-10-22 DIAGNOSIS — K8020 Calculus of gallbladder without cholecystitis without obstruction: Secondary | ICD-10-CM | POA: Diagnosis not present

## 2021-10-23 DIAGNOSIS — M5136 Other intervertebral disc degeneration, lumbar region: Secondary | ICD-10-CM | POA: Diagnosis not present

## 2021-10-23 DIAGNOSIS — I10 Essential (primary) hypertension: Secondary | ICD-10-CM | POA: Diagnosis not present

## 2021-10-23 DIAGNOSIS — M48062 Spinal stenosis, lumbar region with neurogenic claudication: Secondary | ICD-10-CM | POA: Diagnosis not present

## 2021-10-26 DIAGNOSIS — H40013 Open angle with borderline findings, low risk, bilateral: Secondary | ICD-10-CM | POA: Diagnosis not present

## 2021-10-26 DIAGNOSIS — H2511 Age-related nuclear cataract, right eye: Secondary | ICD-10-CM | POA: Diagnosis not present

## 2021-10-26 DIAGNOSIS — H2513 Age-related nuclear cataract, bilateral: Secondary | ICD-10-CM | POA: Diagnosis not present

## 2021-11-09 DIAGNOSIS — M47816 Spondylosis without myelopathy or radiculopathy, lumbar region: Secondary | ICD-10-CM | POA: Diagnosis not present

## 2021-11-16 DIAGNOSIS — M542 Cervicalgia: Secondary | ICD-10-CM | POA: Diagnosis not present

## 2021-11-16 DIAGNOSIS — M797 Fibromyalgia: Secondary | ICD-10-CM | POA: Diagnosis not present

## 2021-11-16 DIAGNOSIS — Z79899 Other long term (current) drug therapy: Secondary | ICD-10-CM | POA: Diagnosis not present

## 2021-11-16 DIAGNOSIS — M25561 Pain in right knee: Secondary | ICD-10-CM | POA: Diagnosis not present

## 2021-11-16 DIAGNOSIS — M545 Low back pain, unspecified: Secondary | ICD-10-CM | POA: Diagnosis not present

## 2021-11-16 DIAGNOSIS — Z9181 History of falling: Secondary | ICD-10-CM | POA: Diagnosis not present

## 2021-11-22 DIAGNOSIS — Z79899 Other long term (current) drug therapy: Secondary | ICD-10-CM | POA: Diagnosis not present

## 2021-11-29 DIAGNOSIS — J302 Other seasonal allergic rhinitis: Secondary | ICD-10-CM | POA: Diagnosis not present

## 2021-11-29 DIAGNOSIS — I10 Essential (primary) hypertension: Secondary | ICD-10-CM | POA: Diagnosis not present

## 2021-11-29 DIAGNOSIS — G4710 Hypersomnia, unspecified: Secondary | ICD-10-CM | POA: Diagnosis not present

## 2021-11-29 DIAGNOSIS — J45909 Unspecified asthma, uncomplicated: Secondary | ICD-10-CM | POA: Diagnosis not present

## 2021-11-30 DIAGNOSIS — R748 Abnormal levels of other serum enzymes: Secondary | ICD-10-CM | POA: Diagnosis not present

## 2021-12-04 DIAGNOSIS — M48062 Spinal stenosis, lumbar region with neurogenic claudication: Secondary | ICD-10-CM | POA: Diagnosis not present

## 2021-12-04 DIAGNOSIS — M4150 Other secondary scoliosis, site unspecified: Secondary | ICD-10-CM | POA: Diagnosis not present

## 2021-12-04 DIAGNOSIS — M5136 Other intervertebral disc degeneration, lumbar region: Secondary | ICD-10-CM | POA: Diagnosis not present

## 2021-12-11 DIAGNOSIS — G4736 Sleep related hypoventilation in conditions classified elsewhere: Secondary | ICD-10-CM | POA: Diagnosis not present

## 2021-12-11 DIAGNOSIS — G4733 Obstructive sleep apnea (adult) (pediatric): Secondary | ICD-10-CM | POA: Diagnosis not present

## 2021-12-11 DIAGNOSIS — R0902 Hypoxemia: Secondary | ICD-10-CM | POA: Diagnosis not present

## 2021-12-11 DIAGNOSIS — G4761 Periodic limb movement disorder: Secondary | ICD-10-CM | POA: Diagnosis not present

## 2021-12-17 ENCOUNTER — Other Ambulatory Visit: Payer: Self-pay | Admitting: Nurse Practitioner

## 2021-12-17 DIAGNOSIS — M797 Fibromyalgia: Secondary | ICD-10-CM | POA: Diagnosis not present

## 2021-12-17 DIAGNOSIS — F32A Depression, unspecified: Secondary | ICD-10-CM | POA: Diagnosis not present

## 2021-12-17 DIAGNOSIS — Z79899 Other long term (current) drug therapy: Secondary | ICD-10-CM | POA: Diagnosis not present

## 2021-12-17 DIAGNOSIS — M25562 Pain in left knee: Secondary | ICD-10-CM | POA: Diagnosis not present

## 2021-12-17 DIAGNOSIS — Z1231 Encounter for screening mammogram for malignant neoplasm of breast: Secondary | ICD-10-CM

## 2021-12-17 DIAGNOSIS — M25561 Pain in right knee: Secondary | ICD-10-CM | POA: Diagnosis not present

## 2021-12-17 DIAGNOSIS — M542 Cervicalgia: Secondary | ICD-10-CM | POA: Diagnosis not present

## 2021-12-17 DIAGNOSIS — Z9181 History of falling: Secondary | ICD-10-CM | POA: Diagnosis not present

## 2021-12-19 DIAGNOSIS — Z79899 Other long term (current) drug therapy: Secondary | ICD-10-CM | POA: Diagnosis not present

## 2021-12-20 DIAGNOSIS — Z961 Presence of intraocular lens: Secondary | ICD-10-CM | POA: Diagnosis not present

## 2021-12-20 DIAGNOSIS — H25043 Posterior subcapsular polar age-related cataract, bilateral: Secondary | ICD-10-CM | POA: Diagnosis not present

## 2021-12-20 DIAGNOSIS — H2513 Age-related nuclear cataract, bilateral: Secondary | ICD-10-CM | POA: Diagnosis not present

## 2021-12-20 DIAGNOSIS — H2511 Age-related nuclear cataract, right eye: Secondary | ICD-10-CM | POA: Diagnosis not present

## 2021-12-20 DIAGNOSIS — H25013 Cortical age-related cataract, bilateral: Secondary | ICD-10-CM | POA: Diagnosis not present

## 2021-12-21 ENCOUNTER — Other Ambulatory Visit: Payer: Self-pay

## 2021-12-21 ENCOUNTER — Ambulatory Visit
Admission: RE | Admit: 2021-12-21 | Discharge: 2021-12-21 | Disposition: A | Discharge status: Home / Self Care | Payer: Medicare Other | Source: Ambulatory Visit | Attending: Nurse Practitioner | Admitting: Nurse Practitioner

## 2021-12-21 DIAGNOSIS — Z1231 Encounter for screening mammogram for malignant neoplasm of breast: Secondary | ICD-10-CM

## 2021-12-21 DIAGNOSIS — H2512 Age-related nuclear cataract, left eye: Secondary | ICD-10-CM | POA: Diagnosis not present

## 2022-01-03 DIAGNOSIS — Z961 Presence of intraocular lens: Secondary | ICD-10-CM | POA: Diagnosis not present

## 2022-01-03 DIAGNOSIS — H25042 Posterior subcapsular polar age-related cataract, left eye: Secondary | ICD-10-CM | POA: Diagnosis not present

## 2022-01-03 DIAGNOSIS — H25012 Cortical age-related cataract, left eye: Secondary | ICD-10-CM | POA: Diagnosis not present

## 2022-01-03 DIAGNOSIS — H2512 Age-related nuclear cataract, left eye: Secondary | ICD-10-CM | POA: Diagnosis not present

## 2022-01-03 DIAGNOSIS — H2513 Age-related nuclear cataract, bilateral: Secondary | ICD-10-CM | POA: Diagnosis not present

## 2022-01-14 DIAGNOSIS — I10 Essential (primary) hypertension: Secondary | ICD-10-CM | POA: Diagnosis not present

## 2022-01-14 DIAGNOSIS — J45909 Unspecified asthma, uncomplicated: Secondary | ICD-10-CM | POA: Diagnosis not present

## 2022-01-14 DIAGNOSIS — G4733 Obstructive sleep apnea (adult) (pediatric): Secondary | ICD-10-CM | POA: Diagnosis not present

## 2022-01-15 DIAGNOSIS — F32A Depression, unspecified: Secondary | ICD-10-CM | POA: Diagnosis not present

## 2022-01-15 DIAGNOSIS — M25561 Pain in right knee: Secondary | ICD-10-CM | POA: Diagnosis not present

## 2022-01-15 DIAGNOSIS — M542 Cervicalgia: Secondary | ICD-10-CM | POA: Diagnosis not present

## 2022-01-15 DIAGNOSIS — Z79899 Other long term (current) drug therapy: Secondary | ICD-10-CM | POA: Diagnosis not present

## 2022-01-15 DIAGNOSIS — M25562 Pain in left knee: Secondary | ICD-10-CM | POA: Diagnosis not present

## 2022-01-15 DIAGNOSIS — Z9181 History of falling: Secondary | ICD-10-CM | POA: Diagnosis not present

## 2022-01-15 DIAGNOSIS — M797 Fibromyalgia: Secondary | ICD-10-CM | POA: Diagnosis not present

## 2022-01-17 DIAGNOSIS — Z79899 Other long term (current) drug therapy: Secondary | ICD-10-CM | POA: Diagnosis not present

## 2022-01-21 DIAGNOSIS — K582 Mixed irritable bowel syndrome: Secondary | ICD-10-CM | POA: Diagnosis not present

## 2022-01-21 DIAGNOSIS — I10 Essential (primary) hypertension: Secondary | ICD-10-CM | POA: Diagnosis not present

## 2022-01-21 DIAGNOSIS — K219 Gastro-esophageal reflux disease without esophagitis: Secondary | ICD-10-CM | POA: Diagnosis not present

## 2022-01-21 DIAGNOSIS — Z23 Encounter for immunization: Secondary | ICD-10-CM | POA: Diagnosis not present

## 2022-01-21 DIAGNOSIS — M797 Fibromyalgia: Secondary | ICD-10-CM | POA: Diagnosis not present

## 2022-01-21 DIAGNOSIS — E7800 Pure hypercholesterolemia, unspecified: Secondary | ICD-10-CM | POA: Diagnosis not present

## 2022-01-21 DIAGNOSIS — J45909 Unspecified asthma, uncomplicated: Secondary | ICD-10-CM | POA: Diagnosis not present

## 2022-01-21 DIAGNOSIS — M542 Cervicalgia: Secondary | ICD-10-CM | POA: Diagnosis not present

## 2022-01-23 DIAGNOSIS — G4733 Obstructive sleep apnea (adult) (pediatric): Secondary | ICD-10-CM | POA: Diagnosis not present

## 2022-01-23 DIAGNOSIS — G4761 Periodic limb movement disorder: Secondary | ICD-10-CM | POA: Diagnosis not present

## 2022-01-23 DIAGNOSIS — R0902 Hypoxemia: Secondary | ICD-10-CM | POA: Diagnosis not present

## 2022-02-06 DIAGNOSIS — Z008 Encounter for other general examination: Secondary | ICD-10-CM | POA: Diagnosis not present

## 2022-02-13 DIAGNOSIS — M25562 Pain in left knee: Secondary | ICD-10-CM | POA: Diagnosis not present

## 2022-02-13 DIAGNOSIS — Z20822 Contact with and (suspected) exposure to covid-19: Secondary | ICD-10-CM | POA: Diagnosis not present

## 2022-02-13 DIAGNOSIS — M25561 Pain in right knee: Secondary | ICD-10-CM | POA: Diagnosis not present

## 2022-02-13 DIAGNOSIS — Z9181 History of falling: Secondary | ICD-10-CM | POA: Diagnosis not present

## 2022-02-13 DIAGNOSIS — E559 Vitamin D deficiency, unspecified: Secondary | ICD-10-CM | POA: Diagnosis not present

## 2022-02-13 DIAGNOSIS — M129 Arthropathy, unspecified: Secondary | ICD-10-CM | POA: Diagnosis not present

## 2022-02-13 DIAGNOSIS — M542 Cervicalgia: Secondary | ICD-10-CM | POA: Diagnosis not present

## 2022-02-13 DIAGNOSIS — Z79899 Other long term (current) drug therapy: Secondary | ICD-10-CM | POA: Diagnosis not present

## 2022-02-13 DIAGNOSIS — F32A Depression, unspecified: Secondary | ICD-10-CM | POA: Diagnosis not present

## 2022-02-13 DIAGNOSIS — M62838 Other muscle spasm: Secondary | ICD-10-CM | POA: Diagnosis not present

## 2022-02-13 DIAGNOSIS — M797 Fibromyalgia: Secondary | ICD-10-CM | POA: Diagnosis not present

## 2022-02-15 DIAGNOSIS — Z79899 Other long term (current) drug therapy: Secondary | ICD-10-CM | POA: Diagnosis not present

## 2022-02-19 ENCOUNTER — Other Ambulatory Visit: Payer: Self-pay | Admitting: Neurosurgery

## 2022-02-21 DIAGNOSIS — J302 Other seasonal allergic rhinitis: Secondary | ICD-10-CM | POA: Diagnosis not present

## 2022-02-21 DIAGNOSIS — I10 Essential (primary) hypertension: Secondary | ICD-10-CM | POA: Diagnosis not present

## 2022-02-21 DIAGNOSIS — F32A Depression, unspecified: Secondary | ICD-10-CM | POA: Diagnosis not present

## 2022-02-21 DIAGNOSIS — G4733 Obstructive sleep apnea (adult) (pediatric): Secondary | ICD-10-CM | POA: Diagnosis not present

## 2022-03-02 DIAGNOSIS — G4733 Obstructive sleep apnea (adult) (pediatric): Secondary | ICD-10-CM | POA: Diagnosis not present

## 2022-03-13 NOTE — Progress Notes (Signed)
Surgical Instructions    Your procedure is scheduled on Friday 16th.  Report to [facility] Main Entrance "A" at 6:30 A.M., then check in with the Admitting office.  Call this number if you have problems the morning of surgery:  [phone]   If you have any questions prior to your surgery date call [phone]: Open Monday-Friday 8am-4pm    Remember:  Do not eat after midnight the night before your surgery  You may drink clear liquids until 5:30am the morning of your surgery.   Clear liquids allowed are: Water, Non-Citrus Juices (without pulp), Carbonated Beverages, Clear Tea, Black Coffee ONLY (NO MILK, CREAM OR POWDERED CREAMER of any kind), and Gatorade    Take these medicines the morning of surgery with A SIP OF WATER: atenolol (TENORMIN) 50 MG tablet diazepam (VALIUM) 5 MG tablet diphenhydrAMINE (BENADRYL) 25 MG tablet gabapentin (NEURONTIN) 600 MG tablet guaiFENesin (MUCINEX) 600 MG 12 hr tablet omeprazole (PRILOSEC) 20 MG capsule  IF NEEDED  acetaminophen (TYLENOL) 500 MG tablet albuterol (VENTOLIN HFA) 108 (90 Base) MCG/ACT inhaler - please bring with you to the hosptial chlorzoxazone (PARAFON) 500 MG tablet cyclobenzaprine (FLEXERIL) 10 MG tablet hydrOXYzine (ATARAX/VISTARIL) 50 MG tablet NARCAN 4 MG/0.1ML LIQD nasal spray kit oxyCODONE-acetaminophen (PERCOCET) 10-325 MG tablet Propylene Glycol (SYSTANE COMPLETE) 0.6 % SOLN  Per anesthesia, the recommendation is to hold the Diethylpropion HCl CR 75 MG TB24 for three days prior to surgery. Please consult with your primary care provider before stopping.   Follow your surgeon's instructions on when to stop Aspirin.  If no instructions were given by your surgeon then you will need to call the office to get those instructions.     As of today, STOP taking any Aspirin (unless otherwise instructed by your surgeon) Diclofenac, Aleve, Naproxen, Ibuprofen, Motrin, Advil, Goody's, BC's, all herbal medications, fish oil, and  all vitamins.           Do not wear jewelry or makeup Do not wear lotions, powders, perfumes, or deodorant. Do not shave 48 hours prior to surgery.   Do not bring valuables to the hospital. Do not wear nail polish, gel polish, artificial nails, or any other type of covering on natural nails (fingers and toes) If you have artificial nails or gel coating that need to be removed by a nail salon, please have this removed prior to surgery. Artificial nails or gel coating may interfere with anesthesia's ability to adequately monitor your vital signs.  [facility] is not responsible for any belongings or valuables. .   Do NOT Smoke (Tobacco/Vaping)  24 hours prior to your procedure  If you use a CPAP at night, you may bring your mask for your overnight stay.   Contacts, glasses, hearing aids, dentures or partials may not be worn into surgery, please bring cases for these belongings   For patients admitted to the hospital, discharge time will be determined by your treatment team.   Patients discharged the day of surgery will not be allowed to drive home, and someone needs to stay with them for 24 hours.   SURGICAL WAITING ROOM VISITATION Patients having surgery or a procedure in a hospital may have two support people. Children under the age of 13 must have an adult with them who is not the patient. They may stay in the waiting area during the procedure and may switch out with other visitors. If the patient needs to stay at the hospital during part of their recovery, the visitor guidelines for inpatient  rooms apply.  Please refer to the [facility] website for the visitor guidelines for Inpatients (after your surgery is over and you are in a regular room).       Special instructions:    Oral Hygiene is also important to reduce your risk of infection.  Remember - BRUSH YOUR TEETH THE MORNING OF SURGERY WITH YOUR REGULAR TOOTHPASTE   [facility]- Preparing For Surgery  Before surgery,  you can play an important role. Because skin is not sterile, your skin needs to be as free of germs as possible. You can reduce the number of germs on your skin by washing with CHG (chlorahexidine gluconate) Soap before surgery.  CHG is an antiseptic cleaner which kills germs and bonds with the skin to continue killing germs even after washing.     Please do not use if you have an allergy to CHG or antibacterial soaps. If your skin becomes reddened/irritated stop using the CHG.  Do not shave (including legs and underarms) for at least 48 hours prior to first CHG shower. It is OK to shave your face.  Please follow these instructions carefully.     Shower the NIGHT BEFORE SURGERY and the MORNING OF SURGERY with CHG Soap.   If you chose to wash your hair, wash your hair first as usual with your normal shampoo. After you shampoo, rinse your hair and body thoroughly to remove the shampoo.  Then [employer] and genitals (private parts) with your normal soap and rinse thoroughly to remove soap.  After that Use CHG Soap as you would any other liquid soap. You can apply CHG directly to the skin and wash gently with a scrungie or a clean washcloth.   Apply the CHG Soap to your body ONLY FROM THE NECK DOWN.  Do not use on open wounds or open sores. Avoid contact with your eyes, ears, mouth and genitals (private parts). Wash Face and genitals (private parts)  with your normal soap.   Wash thoroughly, paying special attention to the area where your surgery will be performed.  Thoroughly rinse your body with warm water from the neck down.  DO NOT shower/wash with your normal soap after using and rinsing off the CHG Soap.  Pat yourself dry with a CLEAN TOWEL.  Wear CLEAN PAJAMAS to bed the night before surgery  Place CLEAN SHEETS on your bed the night before your surgery  DO NOT SLEEP WITH PETS.   Day of Surgery:  Take a shower with CHG soap. Wear Clean/Comfortable clothing the morning of  surgery Do not apply any deodorants/lotions.   Remember to brush your teeth WITH YOUR REGULAR TOOTHPASTE.    If you received a COVID test during your pre-op visit, it is requested that you wear a mask when out in public, stay away from anyone that may not be feeling well, and notify your surgeon if you develop symptoms. If you have been in contact with anyone that has tested positive in the last 10 days, please notify your surgeon.    Please read over the following fact sheets that you were given.

## 2022-03-14 ENCOUNTER — Encounter (HOSPITAL_COMMUNITY)
Admission: RE | Admit: 2022-03-14 | Discharge: 2022-03-14 | Disposition: A | Discharge status: Home / Self Care | Payer: Medicare Other | Source: Ambulatory Visit | Attending: Neurosurgery | Admitting: Neurosurgery

## 2022-03-14 ENCOUNTER — Encounter (HOSPITAL_COMMUNITY): Payer: Self-pay

## 2022-03-14 ENCOUNTER — Other Ambulatory Visit: Payer: Self-pay

## 2022-03-14 VITALS — BP 111/66 | HR 77 | Temp 98.6°F | Resp 18 | Ht 63.0 in | Wt 208.9 lb

## 2022-03-14 DIAGNOSIS — I10 Essential (primary) hypertension: Secondary | ICD-10-CM | POA: Diagnosis not present

## 2022-03-14 DIAGNOSIS — Z01818 Encounter for other preprocedural examination: Secondary | ICD-10-CM | POA: Insufficient documentation

## 2022-03-14 LAB — BASIC METABOLIC PANEL
Anion gap: 9 (ref 5–15)
BUN: 25 mg/dL — ABNORMAL HIGH (ref 6–20)
CO2: 30 mmol/L (ref 22–32)
Calcium: 9.1 mg/dL (ref 8.9–10.3)
Chloride: 102 mmol/L (ref 98–111)
Creatinine, Ser: 0.8 mg/dL (ref 0.44–1.00)
GFR, Estimated: >60 mL/min (ref >60)
Glucose, Bld: 99 mg/dL (ref 70–99)
Potassium: 4.1 mmol/L (ref 3.5–5.1)
Sodium: 141 mmol/L (ref 135–145)

## 2022-03-14 LAB — CBC
HCT: 38.8 % (ref 36.0–46.0)
Hemoglobin: 11.3 g/dL — ABNORMAL LOW (ref 12.0–15.0)
MCH: 22.2 pg — ABNORMAL LOW (ref 26.0–34.0)
MCHC: 29.1 g/dL — ABNORMAL LOW (ref 30.0–36.0)
MCV: 76.1 fL — ABNORMAL LOW (ref 80.0–100.0)
Platelets: 250 10*3/uL (ref 150–400)
RBC: 5.1 MIL/uL (ref 3.87–5.11)
RDW: 19.8 % — ABNORMAL HIGH (ref 11.5–15.5)
WBC: 10.9 10*3/uL — ABNORMAL HIGH (ref 4.0–10.5)
nRBC: 0 % (ref 0.0–0.2)

## 2022-03-14 LAB — SURGICAL PCR SCREEN
MRSA, PCR: NEGATIVE
Staphylococcus aureus: POSITIVE — AB

## 2022-03-14 NOTE — Progress Notes (Signed)
PCP - Lupe Carney, MD Cardiologist - Denies  PPM/ICD - Denies Device Orders -  Rep Notified -   Chest x-ray - NI EKG - 03/14/22 Stress Test - Years ago, no cardiology f/u ECHO - 03/01/20 Cardiac Cath - Denies  Sleep Study - Yes OSA CPAP - nightly, just received this week  DM - Denies  Blood Thinner Instructions: Denies Aspirin Instructions Requested patient call surgeon for instruction on when to stop  COVID TEST- NI   Anesthesia review: No  Patient denies shortness of breath, fever, cough and chest pain at PAT appointment   All instructions explained to the patient, with a verbal understanding of the material. Patient agrees to go over the instructions while at home for a better understanding.The opportunity to ask questions was provided.

## 2022-03-15 DIAGNOSIS — M25562 Pain in left knee: Secondary | ICD-10-CM | POA: Diagnosis not present

## 2022-03-15 DIAGNOSIS — M797 Fibromyalgia: Secondary | ICD-10-CM | POA: Diagnosis not present

## 2022-03-15 DIAGNOSIS — M25561 Pain in right knee: Secondary | ICD-10-CM | POA: Diagnosis not present

## 2022-03-15 DIAGNOSIS — M542 Cervicalgia: Secondary | ICD-10-CM | POA: Diagnosis not present

## 2022-03-15 DIAGNOSIS — F32A Depression, unspecified: Secondary | ICD-10-CM | POA: Diagnosis not present

## 2022-03-15 DIAGNOSIS — Z9181 History of falling: Secondary | ICD-10-CM | POA: Diagnosis not present

## 2022-03-15 DIAGNOSIS — Z79899 Other long term (current) drug therapy: Secondary | ICD-10-CM | POA: Diagnosis not present

## 2022-03-19 DIAGNOSIS — Z79899 Other long term (current) drug therapy: Secondary | ICD-10-CM | POA: Diagnosis not present

## 2022-03-21 NOTE — Anesthesia Preprocedure Evaluation (Addendum)
Anesthesia Evaluation  Patient identified by MRN, date of birth, ID band Patient awake    Reviewed: Allergy & Precautions, NPO status , Patient's Chart, lab work & pertinent test results  History of Anesthesia Complications Negative for: history of anesthetic complications  Airway Mallampati: I  TM Distance: >3 FB Neck ROM: Full    Dental  (+) Edentulous Upper, Edentulous Lower, Lower Dentures, Upper Dentures   Pulmonary asthma , COPD,  COPD inhaler, former smoker,    breath sounds clear to auscultation       Cardiovascular hypertension, Pt. on medications and Pt. on home beta blockers (-) angina Rhythm:Regular Rate:Normal  '21 ECHO:EF 60 to 65%. The LV has normal function, no regional wall motion abnormalities. RVF is normal, no significant valvular abnormalities   Neuro/Psych  Headaches, Anxiety Depression    GI/Hepatic Neg liver ROS, GERD  Controlled,  Endo/Other  Morbid obesity  Renal/GU negative Renal ROS     Musculoskeletal  (+) Arthritis , Fibromyalgia -, narcotic dependent  Abdominal (+) + obese,   Peds  Hematology  (+) Blood dyscrasia (Hb 11.3), anemia ,   Anesthesia Other Findings   Reproductive/Obstetrics                            Anesthesia Physical Anesthesia Plan  ASA: 3  Anesthesia Plan: General   Post-op Pain Management: Tylenol PO (pre-op)*   Induction: Intravenous  PONV Risk Score and Plan: 3 and Ondansetron, Dexamethasone and Scopolamine patch - Pre-op  Airway Management Planned: Oral ETT  Additional Equipment: None  Intra-op Plan:   Post-operative Plan: Extubation in OR  Informed Consent: I have reviewed the patients History and Physical, chart, labs and discussed the procedure including the risks, benefits and alternatives for the proposed anesthesia with the patient or authorized representative who has indicated his/her understanding and acceptance.        Plan Discussed with: CRNA and Surgeon  Anesthesia Plan Comments:        Anesthesia Quick Evaluation

## 2022-03-22 ENCOUNTER — Inpatient Hospital Stay (HOSPITAL_COMMUNITY): Payer: Medicare Other | Admitting: Certified Registered Nurse Anesthetist

## 2022-03-22 ENCOUNTER — Other Ambulatory Visit: Payer: Self-pay

## 2022-03-22 ENCOUNTER — Inpatient Hospital Stay (HOSPITAL_COMMUNITY)
Admission: RE | Admit: 2022-03-22 | Discharge: 2022-05-01 | DRG: 457 | Disposition: A | Discharge status: Home Health | Payer: Medicare Other | Attending: Neurosurgery | Admitting: Neurosurgery

## 2022-03-22 ENCOUNTER — Encounter (HOSPITAL_COMMUNITY): Payer: Self-pay | Admitting: Neurosurgery

## 2022-03-22 ENCOUNTER — Inpatient Hospital Stay (HOSPITAL_COMMUNITY): Payer: Medicare Other

## 2022-03-22 ENCOUNTER — Encounter (HOSPITAL_COMMUNITY)
Admission: RE | Disposition: A | Discharge status: Home Health | Payer: Self-pay | Source: Home / Self Care | Attending: Neurosurgery

## 2022-03-22 DIAGNOSIS — S31000A Unspecified open wound of lower back and pelvis without penetration into retroperitoneum, initial encounter: Secondary | ICD-10-CM | POA: Diagnosis not present

## 2022-03-22 DIAGNOSIS — M40204 Unspecified kyphosis, thoracic region: Secondary | ICD-10-CM | POA: Diagnosis not present

## 2022-03-22 DIAGNOSIS — T8131XA Disruption of external operation (surgical) wound, not elsewhere classified, initial encounter: Secondary | ICD-10-CM | POA: Diagnosis not present

## 2022-03-22 DIAGNOSIS — K219 Gastro-esophageal reflux disease without esophagitis: Secondary | ICD-10-CM | POA: Diagnosis present

## 2022-03-22 DIAGNOSIS — M48062 Spinal stenosis, lumbar region with neurogenic claudication: Secondary | ICD-10-CM

## 2022-03-22 DIAGNOSIS — M797 Fibromyalgia: Secondary | ICD-10-CM | POA: Diagnosis not present

## 2022-03-22 DIAGNOSIS — M4156 Other secondary scoliosis, lumbar region: Secondary | ICD-10-CM | POA: Diagnosis not present

## 2022-03-22 DIAGNOSIS — Z8249 Family history of ischemic heart disease and other diseases of the circulatory system: Secondary | ICD-10-CM | POA: Diagnosis not present

## 2022-03-22 DIAGNOSIS — T8579XA Infection and inflammatory reaction due to other internal prosthetic devices, implants and grafts, initial encounter: Secondary | ICD-10-CM | POA: Diagnosis not present

## 2022-03-22 DIAGNOSIS — Z818 Family history of other mental and behavioral disorders: Secondary | ICD-10-CM

## 2022-03-22 DIAGNOSIS — Z96653 Presence of artificial knee joint, bilateral: Secondary | ICD-10-CM | POA: Diagnosis present

## 2022-03-22 DIAGNOSIS — J449 Chronic obstructive pulmonary disease, unspecified: Secondary | ICD-10-CM | POA: Diagnosis present

## 2022-03-22 DIAGNOSIS — Z823 Family history of stroke: Secondary | ICD-10-CM | POA: Diagnosis not present

## 2022-03-22 DIAGNOSIS — Z91013 Allergy to seafood: Secondary | ICD-10-CM | POA: Diagnosis not present

## 2022-03-22 DIAGNOSIS — M4186 Other forms of scoliosis, lumbar region: Secondary | ICD-10-CM | POA: Diagnosis not present

## 2022-03-22 DIAGNOSIS — Z981 Arthrodesis status: Secondary | ICD-10-CM | POA: Diagnosis not present

## 2022-03-22 DIAGNOSIS — T8142XA Infection following a procedure, deep incisional surgical site, initial encounter: Secondary | ICD-10-CM | POA: Diagnosis not present

## 2022-03-22 DIAGNOSIS — Z6836 Body mass index (BMI) 36.0-36.9, adult: Secondary | ICD-10-CM

## 2022-03-22 DIAGNOSIS — G9600 Cerebrospinal fluid leak, unspecified: Secondary | ICD-10-CM | POA: Diagnosis not present

## 2022-03-22 DIAGNOSIS — M47813 Spondylosis without myelopathy or radiculopathy, cervicothoracic region: Secondary | ICD-10-CM | POA: Diagnosis not present

## 2022-03-22 DIAGNOSIS — Z882 Allergy status to sulfonamides status: Secondary | ICD-10-CM | POA: Diagnosis not present

## 2022-03-22 DIAGNOSIS — Z91041 Radiographic dye allergy status: Secondary | ICD-10-CM

## 2022-03-22 DIAGNOSIS — I10 Essential (primary) hypertension: Secondary | ICD-10-CM | POA: Diagnosis not present

## 2022-03-22 DIAGNOSIS — M4150 Other secondary scoliosis, site unspecified: Secondary | ICD-10-CM | POA: Diagnosis present

## 2022-03-22 DIAGNOSIS — G9601 Cranial cerebrospinal fluid leak, spontaneous: Secondary | ICD-10-CM | POA: Diagnosis not present

## 2022-03-22 DIAGNOSIS — G4733 Obstructive sleep apnea (adult) (pediatric): Secondary | ICD-10-CM | POA: Diagnosis not present

## 2022-03-22 DIAGNOSIS — T8149XA Infection following a procedure, other surgical site, initial encounter: Principal | ICD-10-CM

## 2022-03-22 DIAGNOSIS — F32A Depression, unspecified: Secondary | ICD-10-CM | POA: Diagnosis not present

## 2022-03-22 DIAGNOSIS — Z833 Family history of diabetes mellitus: Secondary | ICD-10-CM

## 2022-03-22 DIAGNOSIS — M4802 Spinal stenosis, cervical region: Secondary | ICD-10-CM | POA: Diagnosis not present

## 2022-03-22 DIAGNOSIS — Z91040 Latex allergy status: Secondary | ICD-10-CM | POA: Diagnosis not present

## 2022-03-22 DIAGNOSIS — M4316 Spondylolisthesis, lumbar region: Secondary | ICD-10-CM | POA: Diagnosis not present

## 2022-03-22 DIAGNOSIS — J45909 Unspecified asthma, uncomplicated: Secondary | ICD-10-CM | POA: Diagnosis not present

## 2022-03-22 DIAGNOSIS — Y848 Other medical procedures as the cause of abnormal reaction of the patient, or of later complication, without mention of misadventure at the time of the procedure: Secondary | ICD-10-CM | POA: Diagnosis not present

## 2022-03-22 DIAGNOSIS — T8189XA Other complications of procedures, not elsewhere classified, initial encounter: Secondary | ICD-10-CM | POA: Diagnosis not present

## 2022-03-22 DIAGNOSIS — B9561 Methicillin susceptible Staphylococcus aureus infection as the cause of diseases classified elsewhere: Secondary | ICD-10-CM | POA: Diagnosis not present

## 2022-03-22 DIAGNOSIS — F418 Other specified anxiety disorders: Secondary | ICD-10-CM | POA: Diagnosis not present

## 2022-03-22 DIAGNOSIS — M4313 Spondylolisthesis, cervicothoracic region: Secondary | ICD-10-CM | POA: Diagnosis not present

## 2022-03-22 DIAGNOSIS — T8140XA Infection following a procedure, unspecified, initial encounter: Secondary | ICD-10-CM | POA: Diagnosis not present

## 2022-03-22 DIAGNOSIS — Z87891 Personal history of nicotine dependence: Secondary | ICD-10-CM | POA: Diagnosis not present

## 2022-03-22 DIAGNOSIS — M4126 Other idiopathic scoliosis, lumbar region: Secondary | ICD-10-CM | POA: Diagnosis not present

## 2022-03-22 DIAGNOSIS — Z7982 Long term (current) use of aspirin: Secondary | ICD-10-CM | POA: Diagnosis not present

## 2022-03-22 DIAGNOSIS — T8141XA Infection following a procedure, superficial incisional surgical site, initial encounter: Secondary | ICD-10-CM | POA: Diagnosis not present

## 2022-03-22 DIAGNOSIS — Z79899 Other long term (current) drug therapy: Secondary | ICD-10-CM

## 2022-03-22 DIAGNOSIS — N390 Urinary tract infection, site not specified: Secondary | ICD-10-CM | POA: Diagnosis not present

## 2022-03-22 DIAGNOSIS — M5023 Other cervical disc displacement, cervicothoracic region: Secondary | ICD-10-CM | POA: Diagnosis not present

## 2022-03-22 DIAGNOSIS — D649 Anemia, unspecified: Secondary | ICD-10-CM | POA: Diagnosis not present

## 2022-03-22 DIAGNOSIS — B9620 Unspecified Escherichia coli [E. coli] as the cause of diseases classified elsewhere: Secondary | ICD-10-CM | POA: Diagnosis present

## 2022-03-22 HISTORY — PX: LAMINECTOMY WITH POSTERIOR LATERAL ARTHRODESIS LEVEL 4: SHX6338

## 2022-03-22 LAB — CBC
HCT: 35.3 % — ABNORMAL LOW (ref 36.0–46.0)
Hemoglobin: 10.3 g/dL — ABNORMAL LOW (ref 12.0–15.0)
MCH: 22.1 pg — ABNORMAL LOW (ref 26.0–34.0)
MCHC: 29.2 g/dL — ABNORMAL LOW (ref 30.0–36.0)
MCV: 75.8 fL — ABNORMAL LOW (ref 80.0–100.0)
Platelets: 347 10*3/uL (ref 150–400)
RBC: 4.66 MIL/uL (ref 3.87–5.11)
RDW: 19.7 % — ABNORMAL HIGH (ref 11.5–15.5)
WBC: 15.8 10*3/uL — ABNORMAL HIGH (ref 4.0–10.5)
nRBC: 0 % (ref 0.0–0.2)

## 2022-03-22 LAB — CREATININE, SERUM
Creatinine, Ser: 0.64 mg/dL (ref 0.44–1.00)
GFR, Estimated: >60 mL/min (ref >60)

## 2022-03-22 LAB — POCT I-STAT EG7
Acid-Base Excess: 3 mmol/L — ABNORMAL HIGH (ref 0.0–2.0)
Bicarbonate: 28.2 mmol/L — ABNORMAL HIGH (ref 20.0–28.0)
Calcium, Ion: 1.16 mmol/L (ref 1.15–1.40)
HCT: 31 % — ABNORMAL LOW (ref 36.0–46.0)
Hemoglobin: 10.5 g/dL — ABNORMAL LOW (ref 12.0–15.0)
O2 Saturation: 79 %
Potassium: 3.3 mmol/L — ABNORMAL LOW (ref 3.5–5.1)
Sodium: 138 mmol/L (ref 135–145)
TCO2: 30 mmol/L (ref 22–32)
pCO2, Ven: 45.2 mmHg (ref 44–60)
pH, Ven: 7.403 (ref 7.25–7.43)
pO2, Ven: 44 mmHg (ref 32–45)

## 2022-03-22 LAB — TYPE AND SCREEN
ABO/RH(D): B POS
Antibody Screen: NEGATIVE

## 2022-03-22 SURGERY — LAMINECTOMY WITH POSTERIOR LATERAL ARTHRODESIS LEVEL 4
Anesthesia: General | Laterality: Bilateral

## 2022-03-22 MED ORDER — SCOPOLAMINE 1 MG/3DAYS TD PT72
1.0000 | MEDICATED_PATCH | TRANSDERMAL | Status: DC
  Administered 2022-03-22: 1.5 mg via TRANSDERMAL
  Filled 2022-03-22: qty 1

## 2022-03-22 MED ORDER — PHENYLEPHRINE 80 MCG/ML (10ML) SYRINGE FOR IV PUSH (FOR BLOOD PRESSURE SUPPORT)
PREFILLED_SYRINGE | INTRAVENOUS | Status: DC | PRN
  Administered 2022-03-22: 80 ug via INTRAVENOUS

## 2022-03-22 MED ORDER — MEPERIDINE HCL 25 MG/ML IJ SOLN: 6.2500 mg | INTRAMUSCULAR | Status: DC | PRN

## 2022-03-22 MED ORDER — HEPARIN SODIUM (PORCINE) 5000 UNIT/ML IJ SOLN
5000.0000 [IU] | Freq: Three times a day (TID) | INTRAMUSCULAR | Status: DC
  Administered 2022-03-22 – 2022-05-01 (×117): 5000 [IU] via SUBCUTANEOUS
  Filled 2022-03-22 (×115): qty 1

## 2022-03-22 MED ORDER — CELECOXIB 200 MG PO CAPS
200.0000 mg | ORAL_CAPSULE | Freq: Two times a day (BID) | ORAL | Status: DC
  Administered 2022-03-23 – 2022-05-01 (×77): 200 mg via ORAL
  Filled 2022-03-22 (×80): qty 1

## 2022-03-22 MED ORDER — GABAPENTIN 600 MG PO TABS
1200.0000 mg | ORAL_TABLET | Freq: Three times a day (TID) | ORAL | Status: DC
  Administered 2022-03-22 – 2022-05-01 (×117): 1200 mg via ORAL
  Filled 2022-03-22 (×121): qty 2

## 2022-03-22 MED ORDER — THROMBIN 20000 UNITS EX SOLR
CUTANEOUS | Status: DC | PRN
  Administered 2022-03-22: 20 mL via TOPICAL

## 2022-03-22 MED ORDER — DEXAMETHASONE SODIUM PHOSPHATE 10 MG/ML IJ SOLN
INTRAMUSCULAR | Status: DC | PRN
  Administered 2022-03-22: 10 mg via INTRAVENOUS

## 2022-03-22 MED ORDER — ORAL CARE MOUTH RINSE: 15.0000 mL | Freq: Once | OROMUCOSAL | Status: AC

## 2022-03-22 MED ORDER — MENTHOL 3 MG MT LOZG: 1.0000 | LOZENGE | OROMUCOSAL | Status: DC | PRN

## 2022-03-22 MED ORDER — ACETAMINOPHEN 500 MG PO TABS
1000.0000 mg | ORAL_TABLET | Freq: Four times a day (QID) | ORAL | Status: AC
  Administered 2022-03-23 (×3): 1000 mg via ORAL
  Filled 2022-03-22 (×3): qty 2

## 2022-03-22 MED ORDER — POLYVINYL ALCOHOL 1.4 % OP SOLN
1.0000 [drp] | Freq: Every day | OPHTHALMIC | Status: DC | PRN
Start: 2022-03-22 — End: 2022-05-02

## 2022-03-22 MED ORDER — ADHERUS DURAL SEALANT
PACK | TOPICAL | Status: DC | PRN
  Administered 2022-03-22: 1 via TOPICAL

## 2022-03-22 MED ORDER — PHENOL 1.4 % MT LIQD: 1.0000 | OROMUCOSAL | Status: DC | PRN

## 2022-03-22 MED ORDER — THROMBIN 20000 UNITS EX SOLR: CUTANEOUS | Status: DC | PRN

## 2022-03-22 MED ORDER — ALBUTEROL SULFATE (2.5 MG/3ML) 0.083% IN NEBU
2.5000 mg | INHALATION_SOLUTION | Freq: Four times a day (QID) | RESPIRATORY_TRACT | Status: DC | PRN
Start: 2022-03-22 — End: 2022-05-02
  Administered 2022-03-30: 2.5 mg via RESPIRATORY_TRACT
  Filled 2022-03-22: qty 3

## 2022-03-22 MED ORDER — OXYCODONE HCL 5 MG PO TABS: 5.0000 mg | ORAL_TABLET | Freq: Once | ORAL | Status: DC | PRN

## 2022-03-22 MED ORDER — ACETAMINOPHEN 500 MG PO TABS: 1000.0000 mg | ORAL_TABLET | Freq: Once | ORAL | Status: DC

## 2022-03-22 MED ORDER — PROPOFOL 10 MG/ML IV BOLUS
INTRAVENOUS | Status: DC | PRN
  Administered 2022-03-22: 100 mg via INTRAVENOUS

## 2022-03-22 MED ORDER — ACETAMINOPHEN 650 MG RE SUPP: 650.0000 mg | RECTAL | Status: DC | PRN

## 2022-03-22 MED ORDER — ZOLPIDEM TARTRATE 5 MG PO TABS
5.0000 mg | ORAL_TABLET | Freq: Every evening | ORAL | Status: DC | PRN
  Administered 2022-04-08 – 2022-04-29 (×7): 5 mg via ORAL
  Filled 2022-03-22 (×7): qty 1

## 2022-03-22 MED ORDER — THROMBIN 20000 UNITS EX SOLR
CUTANEOUS | Status: AC
  Filled 2022-03-22: qty 20000

## 2022-03-22 MED ORDER — VANCOMYCIN HCL 1250 MG/250ML IV SOLN
1250.0000 mg | Freq: Once | INTRAVENOUS | Status: AC
  Administered 2022-03-23: 1250 mg via INTRAVENOUS
  Filled 2022-03-22: qty 250

## 2022-03-22 MED ORDER — OXYCODONE HCL 5 MG PO TABS
5.0000 mg | ORAL_TABLET | ORAL | Status: DC | PRN
  Administered 2022-03-22 – 2022-04-29 (×6): 5 mg via ORAL
  Filled 2022-03-22 (×6): qty 1

## 2022-03-22 MED ORDER — ADULT MULTIVITAMIN W/MINERALS CH
1.0000 | ORAL_TABLET | Freq: Every day | ORAL | Status: DC
  Administered 2022-03-22 – 2022-05-01 (×40): 1 via ORAL
  Filled 2022-03-22 (×41): qty 1

## 2022-03-22 MED ORDER — DIAZEPAM 5 MG PO TABS
5.0000 mg | ORAL_TABLET | Freq: Four times a day (QID) | ORAL | Status: DC | PRN
  Administered 2022-03-22 – 2022-04-22 (×17): 5 mg via ORAL
  Filled 2022-03-22 (×17): qty 1

## 2022-03-22 MED ORDER — FENTANYL CITRATE (PF) 250 MCG/5ML IJ SOLN
INTRAMUSCULAR | Status: AC
  Filled 2022-03-22: qty 5

## 2022-03-22 MED ORDER — EPHEDRINE SULFATE-NACL 50-0.9 MG/10ML-% IV SOSY
PREFILLED_SYRINGE | INTRAVENOUS | Status: DC | PRN
  Administered 2022-03-22: 10 mg via INTRAVENOUS

## 2022-03-22 MED ORDER — VANCOMYCIN HCL 1000 MG IV SOLR
INTRAVENOUS | Status: DC | PRN
  Administered 2022-03-22: 1000 mg

## 2022-03-22 MED ORDER — ONDANSETRON HCL 4 MG PO TABS
4.0000 mg | ORAL_TABLET | Freq: Four times a day (QID) | ORAL | Status: DC | PRN
  Administered 2022-04-29: 4 mg via ORAL
  Filled 2022-03-22: qty 1

## 2022-03-22 MED ORDER — ROCURONIUM BROMIDE 10 MG/ML (PF) SYRINGE
PREFILLED_SYRINGE | INTRAVENOUS | Status: DC | PRN
  Administered 2022-03-22: 70 mg via INTRAVENOUS
  Administered 2022-03-22 (×2): 30 mg via INTRAVENOUS

## 2022-03-22 MED ORDER — DIETHYLPROPION HCL ER 75 MG PO TB24: 75.0000 mg | ORAL_TABLET | Freq: Every day | ORAL | Status: DC

## 2022-03-22 MED ORDER — MIDAZOLAM HCL 2 MG/2ML IJ SOLN
INTRAMUSCULAR | Status: AC
  Filled 2022-03-22: qty 2

## 2022-03-22 MED ORDER — MIDAZOLAM HCL 2 MG/2ML IJ SOLN
INTRAMUSCULAR | Status: DC | PRN
  Administered 2022-03-22: 2 mg via INTRAVENOUS

## 2022-03-22 MED ORDER — SODIUM CHLORIDE 0.9% FLUSH
3.0000 mL | INTRAVENOUS | Status: DC | PRN
  Administered 2022-04-15 – 2022-04-24 (×2): 3 mL via INTRAVENOUS

## 2022-03-22 MED ORDER — CHLORHEXIDINE GLUCONATE CLOTH 2 % EX PADS: 6.0000 | MEDICATED_PAD | Freq: Once | CUTANEOUS | Status: DC

## 2022-03-22 MED ORDER — LIDOCAINE-EPINEPHRINE 0.5 %-1:200000 IJ SOLN: INTRAMUSCULAR | Status: DC | PRN

## 2022-03-22 MED ORDER — BUPIVACAINE HCL (PF) 0.5 % IJ SOLN
INTRAMUSCULAR | Status: AC
  Filled 2022-03-22: qty 30

## 2022-03-22 MED ORDER — OXYCODONE HCL 5 MG PO TABS
10.0000 mg | ORAL_TABLET | ORAL | Status: DC | PRN
  Administered 2022-03-23 – 2022-05-01 (×73): 10 mg via ORAL
  Filled 2022-03-22 (×75): qty 2

## 2022-03-22 MED ORDER — LACTATED RINGERS IV SOLN: INTRAVENOUS | Status: DC

## 2022-03-22 MED ORDER — HYDROMORPHONE HCL 1 MG/ML IJ SOLN
INTRAMUSCULAR | Status: AC
  Administered 2022-03-22: 0.5 mg via INTRAVENOUS
  Filled 2022-03-22: qty 1

## 2022-03-22 MED ORDER — LIDOCAINE-EPINEPHRINE 0.5 %-1:200000 IJ SOLN
INTRAMUSCULAR | Status: AC
  Filled 2022-03-22: qty 1

## 2022-03-22 MED ORDER — ATENOLOL 25 MG PO TABS
50.0000 mg | ORAL_TABLET | Freq: Every day | ORAL | Status: DC
  Administered 2022-03-23 – 2022-05-01 (×36): 50 mg via ORAL
  Filled 2022-03-22: qty 1
  Filled 2022-03-22 (×4): qty 2
  Filled 2022-03-22: qty 1
  Filled 2022-03-22 (×2): qty 2
  Filled 2022-03-22: qty 1
  Filled 2022-03-22: qty 2
  Filled 2022-03-22: qty 1
  Filled 2022-03-22 (×8): qty 2
  Filled 2022-03-22: qty 1
  Filled 2022-03-22 (×3): qty 2
  Filled 2022-03-22: qty 1
  Filled 2022-03-22 (×4): qty 2
  Filled 2022-03-22: qty 1
  Filled 2022-03-22 (×8): qty 2
  Filled 2022-03-22: qty 1
  Filled 2022-03-22: qty 2

## 2022-03-22 MED ORDER — OXYCODONE HCL ER 10 MG PO T12A
20.0000 mg | EXTENDED_RELEASE_TABLET | Freq: Two times a day (BID) | ORAL | Status: DC
  Administered 2022-03-22 – 2022-03-23 (×2): 20 mg via ORAL
  Filled 2022-03-22 (×2): qty 1
  Filled 2022-03-22: qty 2

## 2022-03-22 MED ORDER — ACETAMINOPHEN 325 MG PO TABS
650.0000 mg | ORAL_TABLET | ORAL | Status: DC | PRN
  Administered 2022-03-23 – 2022-05-01 (×25): 650 mg via ORAL
  Filled 2022-03-22 (×25): qty 2

## 2022-03-22 MED ORDER — CEFAZOLIN SODIUM-DEXTROSE 2-4 GM/100ML-% IV SOLN
2.0000 g | INTRAVENOUS | Status: AC
  Administered 2022-03-22 (×2): 2 g via INTRAVENOUS
  Filled 2022-03-22: qty 100

## 2022-03-22 MED ORDER — ONDANSETRON HCL 4 MG/2ML IJ SOLN: 4.0000 mg | Freq: Four times a day (QID) | INTRAMUSCULAR | Status: DC | PRN

## 2022-03-22 MED ORDER — LUBIPROSTONE 24 MCG PO CAPS
24.0000 ug | ORAL_CAPSULE | Freq: Two times a day (BID) | ORAL | Status: DC
  Administered 2022-03-23 – 2022-04-04 (×25): 24 ug via ORAL
  Filled 2022-03-22 (×27): qty 1

## 2022-03-22 MED ORDER — LIDOCAINE 2% (20 MG/ML) 5 ML SYRINGE
INTRAMUSCULAR | Status: DC | PRN
  Administered 2022-03-22: 40 mg via INTRAVENOUS

## 2022-03-22 MED ORDER — PANTOPRAZOLE SODIUM 40 MG PO TBEC
40.0000 mg | DELAYED_RELEASE_TABLET | Freq: Every day | ORAL | Status: DC
  Administered 2022-03-23 – 2022-05-01 (×39): 40 mg via ORAL
  Filled 2022-03-22 (×40): qty 1

## 2022-03-22 MED ORDER — GUAIFENESIN ER 600 MG PO TB12
600.0000 mg | ORAL_TABLET | Freq: Two times a day (BID) | ORAL | Status: DC | PRN
Start: 2022-03-22 — End: 2022-05-02

## 2022-03-22 MED ORDER — BACITRACIN ZINC 500 UNIT/GM EX OINT
TOPICAL_OINTMENT | CUTANEOUS | Status: AC
  Filled 2022-03-22: qty 28.35

## 2022-03-22 MED ORDER — HYDROCHLOROTHIAZIDE 12.5 MG PO TABS
12.5000 mg | ORAL_TABLET | Freq: Every day | ORAL | Status: DC
  Administered 2022-03-23 – 2022-05-01 (×37): 12.5 mg via ORAL
  Filled 2022-03-22 (×39): qty 1

## 2022-03-22 MED ORDER — CHLORZOXAZONE 500 MG PO TABS
500.0000 mg | ORAL_TABLET | Freq: Three times a day (TID) | ORAL | Status: DC
  Administered 2022-03-22 – 2022-05-01 (×116): 500 mg via ORAL
  Filled 2022-03-22 (×123): qty 1

## 2022-03-22 MED ORDER — HYDROMORPHONE HCL 1 MG/ML IJ SOLN
0.2500 mg | INTRAMUSCULAR | Status: DC | PRN
  Administered 2022-03-22: 0.5 mg via INTRAVENOUS

## 2022-03-22 MED ORDER — VANCOMYCIN HCL 1000 MG IV SOLR
INTRAVENOUS | Status: AC
  Filled 2022-03-22: qty 20

## 2022-03-22 MED ORDER — SENNA 8.6 MG PO TABS
1.0000 | ORAL_TABLET | Freq: Two times a day (BID) | ORAL | Status: DC | PRN
  Administered 2022-03-26 – 2022-04-28 (×11): 8.6 mg via ORAL
  Filled 2022-03-22 (×11): qty 1

## 2022-03-22 MED ORDER — CHLORHEXIDINE GLUCONATE 0.12 % MT SOLN
15.0000 mL | Freq: Once | OROMUCOSAL | Status: AC
  Administered 2022-03-22: 15 mL via OROMUCOSAL
  Filled 2022-03-22: qty 15

## 2022-03-22 MED ORDER — SODIUM CHLORIDE 0.9% FLUSH
3.0000 mL | Freq: Two times a day (BID) | INTRAVENOUS | Status: DC
  Administered 2022-03-22 – 2022-05-01 (×60): 3 mL via INTRAVENOUS

## 2022-03-22 MED ORDER — MORPHINE SULFATE (PF) 2 MG/ML IV SOLN
2.0000 mg | INTRAVENOUS | Status: DC | PRN
  Administered 2022-03-22 – 2022-04-16 (×12): 2 mg via INTRAVENOUS
  Filled 2022-03-22 (×14): qty 1

## 2022-03-22 MED ORDER — BISACODYL 5 MG PO TBEC
5.0000 mg | DELAYED_RELEASE_TABLET | Freq: Every day | ORAL | Status: DC | PRN
  Administered 2022-03-27 – 2022-04-28 (×9): 5 mg via ORAL
  Filled 2022-03-22 (×10): qty 1

## 2022-03-22 MED ORDER — ASCORBIC ACID 500 MG PO TABS
500.0000 mg | ORAL_TABLET | Freq: Every day | ORAL | Status: DC
Start: 2022-03-22 — End: 2022-05-02
  Administered 2022-03-22 – 2022-05-01 (×40): 500 mg via ORAL
  Filled 2022-03-22 (×41): qty 1

## 2022-03-22 MED ORDER — POTASSIUM CHLORIDE IN NACL 20-0.9 MEQ/L-% IV SOLN
INTRAVENOUS | Status: DC
  Filled 2022-03-22: qty 1000

## 2022-03-22 MED ORDER — POTASSIUM CHLORIDE CRYS ER 10 MEQ PO TBCR
10.0000 meq | EXTENDED_RELEASE_TABLET | Freq: Every day | ORAL | Status: DC
  Administered 2022-03-23 – 2022-05-01 (×38): 10 meq via ORAL
  Filled 2022-03-22 (×46): qty 1

## 2022-03-22 MED ORDER — PROPOFOL 500 MG/50ML IV EMUL
INTRAVENOUS | Status: DC | PRN
  Administered 2022-03-22: 75 ug/kg/min via INTRAVENOUS

## 2022-03-22 MED ORDER — FLEET ENEMA 7-19 GM/118ML RE ENEM
1.0000 | ENEMA | Freq: Once | RECTAL | Status: AC | PRN
  Administered 2022-03-31: 1 via RECTAL
  Filled 2022-03-22: qty 1

## 2022-03-22 MED ORDER — MIDAZOLAM HCL 2 MG/2ML IJ SOLN: 0.5000 mg | Freq: Once | INTRAMUSCULAR | Status: DC | PRN

## 2022-03-22 MED ORDER — SODIUM CHLORIDE 0.9 % IV SOLN: 250.0000 mL | INTRAVENOUS | Status: DC

## 2022-03-22 MED ORDER — PHENYLEPHRINE HCL-NACL 20-0.9 MG/250ML-% IV SOLN
INTRAVENOUS | Status: DC | PRN
  Administered 2022-03-22: 20 ug/min via INTRAVENOUS

## 2022-03-22 MED ORDER — DIPHENHYDRAMINE HCL 25 MG PO TABS
50.0000 mg | ORAL_TABLET | ORAL | Status: DC | PRN
  Administered 2022-03-23: 50 mg via ORAL
  Filled 2022-03-22 (×2): qty 2

## 2022-03-22 MED ORDER — FUROSEMIDE 40 MG PO TABS
40.0000 mg | ORAL_TABLET | Freq: Every day | ORAL | Status: DC
  Administered 2022-03-22 – 2022-03-27 (×4): 40 mg via ORAL
  Filled 2022-03-22 (×5): qty 1

## 2022-03-22 MED ORDER — ALBUTEROL SULFATE HFA 108 (90 BASE) MCG/ACT IN AERS: 2.0000 | INHALATION_SPRAY | Freq: Four times a day (QID) | RESPIRATORY_TRACT | Status: DC | PRN

## 2022-03-22 MED ORDER — ZOLPIDEM TARTRATE 5 MG PO TABS
10.0000 mg | ORAL_TABLET | Freq: Every evening | ORAL | Status: DC | PRN
Start: 2022-03-22 — End: 2022-03-22

## 2022-03-22 MED ORDER — THROMBIN 5000 UNITS EX SOLR
CUTANEOUS | Status: AC
  Filled 2022-03-22: qty 5000

## 2022-03-22 MED ORDER — HYDROXYZINE HCL 25 MG PO TABS
50.0000 mg | ORAL_TABLET | Freq: Two times a day (BID) | ORAL | Status: DC
  Administered 2022-03-22 – 2022-05-01 (×75): 50 mg via ORAL
  Filled 2022-03-22 (×8): qty 2
  Filled 2022-03-22: qty 1
  Filled 2022-03-22 (×3): qty 2
  Filled 2022-03-22: qty 1
  Filled 2022-03-22 (×3): qty 2
  Filled 2022-03-22: qty 1
  Filled 2022-03-22 (×3): qty 2
  Filled 2022-03-22: qty 1
  Filled 2022-03-22 (×2): qty 2
  Filled 2022-03-22: qty 1
  Filled 2022-03-22: qty 2
  Filled 2022-03-22: qty 1
  Filled 2022-03-22: qty 2
  Filled 2022-03-22: qty 1
  Filled 2022-03-22 (×3): qty 2
  Filled 2022-03-22: qty 1
  Filled 2022-03-22 (×4): qty 2
  Filled 2022-03-22: qty 1
  Filled 2022-03-22 (×3): qty 2
  Filled 2022-03-22: qty 1
  Filled 2022-03-22 (×5): qty 2
  Filled 2022-03-22 (×2): qty 1
  Filled 2022-03-22 (×2): qty 2
  Filled 2022-03-22: qty 1
  Filled 2022-03-22 (×8): qty 2
  Filled 2022-03-22: qty 1
  Filled 2022-03-22 (×3): qty 2
  Filled 2022-03-22: qty 1
  Filled 2022-03-22 (×3): qty 2
  Filled 2022-03-22: qty 1
  Filled 2022-03-22 (×4): qty 2
  Filled 2022-03-22: qty 1
  Filled 2022-03-22 (×2): qty 2
  Filled 2022-03-22: qty 1
  Filled 2022-03-22 (×6): qty 2

## 2022-03-22 MED ORDER — ONDANSETRON HCL 4 MG/2ML IJ SOLN
INTRAMUSCULAR | Status: DC | PRN
  Administered 2022-03-22: 4 mg via INTRAVENOUS

## 2022-03-22 MED ORDER — SUGAMMADEX SODIUM 200 MG/2ML IV SOLN
INTRAVENOUS | Status: DC | PRN
  Administered 2022-03-22: 187.8 mg via INTRAVENOUS

## 2022-03-22 MED ORDER — ASPIRIN 81 MG PO TBEC
81.0000 mg | DELAYED_RELEASE_TABLET | Freq: Every day | ORAL | Status: DC
  Administered 2022-03-23 – 2022-05-01 (×39): 81 mg via ORAL
  Filled 2022-03-22 (×40): qty 1

## 2022-03-22 MED ORDER — 0.9 % SODIUM CHLORIDE (POUR BTL) OPTIME
TOPICAL | Status: DC | PRN
  Administered 2022-03-22 (×2): 1000 mL

## 2022-03-22 MED ORDER — OXYCODONE HCL 5 MG/5ML PO SOLN: 5.0000 mg | Freq: Once | ORAL | Status: DC | PRN

## 2022-03-22 MED ORDER — FENTANYL CITRATE (PF) 250 MCG/5ML IJ SOLN
INTRAMUSCULAR | Status: DC | PRN
Start: 2022-03-22 — End: 2022-03-22
  Administered 2022-03-22: 50 ug via INTRAVENOUS
  Administered 2022-03-22: 150 ug via INTRAVENOUS
  Administered 2022-03-22: 50 ug via INTRAVENOUS
  Administered 2022-03-22: 100 ug via INTRAVENOUS

## 2022-03-22 MED ORDER — BUPIVACAINE HCL (PF) 0.5 % IJ SOLN
INTRAMUSCULAR | Status: DC | PRN
  Administered 2022-03-22: 30 mL

## 2022-03-22 SURGICAL SUPPLY — 80 items
ADH SKN CLS APL DERMABOND .7 (GAUZE/BANDAGES/DRESSINGS) ×1
APL SKNCLS STERI-STRIP NONHPOA (GAUZE/BANDAGES/DRESSINGS)
BAG COUNTER SPONGE SURGICOUNT (BAG) ×4 IMPLANT
BAG SPNG CNTER NS LX DISP (BAG) ×2
BENZOIN TINCTURE PRP APPL 2/3 (GAUZE/BANDAGES/DRESSINGS) IMPLANT
BLADE CLIPPER SURG (BLADE) IMPLANT
BLADE J PLASMA LAP HANDPIECE (INSTRUMENTS) ×1 IMPLANT
BUR MATCHSTICK NEURO 3.0 LAGG (BURR) ×3 IMPLANT
BUR PRECISION FLUTE 5.0 (BURR) ×1 IMPLANT
CANISTER SUCT 3000ML PPV (MISCELLANEOUS) ×3 IMPLANT
CARTRIDGE OIL MAESTRO DRILL (MISCELLANEOUS) ×2 IMPLANT
CATH FOLEY LATEX FREE 16FR (CATHETERS) ×2
CATH FOLEY LF 16FR (CATHETERS) IMPLANT
CNTNR URN SCR LID CUP LEK RST (MISCELLANEOUS) ×2 IMPLANT
CONT SPEC 4OZ STRL OR WHT (MISCELLANEOUS) ×2
COVER BACK TABLE 24X17X13 BIG (DRAPES) ×1 IMPLANT
DERMABOND ADVANCED (GAUZE/BANDAGES/DRESSINGS) ×1
DERMABOND ADVANCED .7 DNX12 (GAUZE/BANDAGES/DRESSINGS) ×2 IMPLANT
DIFFUSER DRILL AIR PNEUMATIC (MISCELLANEOUS) ×3 IMPLANT
DIGITIZER BENDINI (MISCELLANEOUS) ×1 IMPLANT
DRAPE C-ARM 42X72 X-RAY (DRAPES) ×6 IMPLANT
DRAPE C-ARMOR (DRAPES) ×1 IMPLANT
DRAPE LAPAROTOMY 100X72X124 (DRAPES) ×3 IMPLANT
DRAPE SURG 17X23 STRL (DRAPES) ×3 IMPLANT
DRSG OPSITE POSTOP 3X4 (GAUZE/BANDAGES/DRESSINGS) ×1 IMPLANT
DRSG OPSITE POSTOP 4X10 (GAUZE/BANDAGES/DRESSINGS) ×1 IMPLANT
DURAPREP 26ML APPLICATOR (WOUND CARE) ×3 IMPLANT
ELECT REM PT RETURN 9FT ADLT (ELECTROSURGICAL) ×2
ELECTRODE REM PT RTRN 9FT ADLT (ELECTROSURGICAL) ×2 IMPLANT
GAUZE 4X4 16PLY ~~LOC~~+RFID DBL (SPONGE) IMPLANT
GAUZE SPONGE 4X4 12PLY STRL (GAUZE/BANDAGES/DRESSINGS) IMPLANT
GLOVE BIOGEL PI IND STRL 7.5 (GLOVE) IMPLANT
GLOVE BIOGEL PI INDICATOR 7.5 (GLOVE) ×7
GLOVE ECLIPSE 6.5 STRL STRAW (GLOVE) ×6 IMPLANT
GLOVE EXAM NITRILE XL STR (GLOVE) IMPLANT
GLOVE SURG SS PI 6.5 STRL IVOR (GLOVE) ×5 IMPLANT
GLOVE SURG SS PI 7.5 STRL IVOR (GLOVE) ×4 IMPLANT
GOWN STRL REUS W/ TWL LRG LVL3 (GOWN DISPOSABLE) ×4 IMPLANT
GOWN STRL REUS W/ TWL XL LVL3 (GOWN DISPOSABLE) IMPLANT
GOWN STRL REUS W/TWL 2XL LVL3 (GOWN DISPOSABLE) IMPLANT
GOWN STRL REUS W/TWL LRG LVL3 (GOWN DISPOSABLE) ×4
GOWN STRL REUS W/TWL XL LVL3 (GOWN DISPOSABLE)
GRAFT BN 10X1XDBM MAGNIFUSE (Bone Implant) IMPLANT
GRAFT BONE MAGNIFUSE 1X10CM (Bone Implant) ×2 IMPLANT
GRAFT DURAGEN MATRIX 1WX1L (Tissue) ×1 IMPLANT
HEMOSTAT POWDER KIT SURGIFOAM (HEMOSTASIS) IMPLANT
KIT BASIN OR (CUSTOM PROCEDURE TRAY) ×3 IMPLANT
KIT POSITION SURG JACKSON T1 (MISCELLANEOUS) ×3 IMPLANT
KIT TURNOVER KIT B (KITS) ×3 IMPLANT
NDL HYPO 25X1 1.5 SAFETY (NEEDLE) ×2 IMPLANT
NDL SPNL 18GX3.5 QUINCKE PK (NEEDLE) IMPLANT
NEEDLE HYPO 25X1 1.5 SAFETY (NEEDLE) ×2 IMPLANT
NEEDLE SPNL 18GX3.5 QUINCKE PK (NEEDLE) IMPLANT
NS IRRIG 1000ML POUR BTL (IV SOLUTION) ×4 IMPLANT
OIL CARTRIDGE MAESTRO DRILL (MISCELLANEOUS) ×2
PACK LAMINECTOMY NEURO (CUSTOM PROCEDURE TRAY) ×3 IMPLANT
PAD ARMBOARD 7.5X6 YLW CONV (MISCELLANEOUS) ×9 IMPLANT
RASP 3.0MM (RASP) ×1 IMPLANT
ROD RELINE 0-0 CON M 5.0/6.0MM (Rod) ×2 IMPLANT
ROD RELINE-O 5.5X300 STRT NS (Rod) IMPLANT
ROD RELINE-O 5.5X300MM STRT (Rod) ×6 IMPLANT
SCREW LOCK RELINE 5.5 TULIP (Screw) ×15 IMPLANT
SCREW RELINE-O POLY 5.5X40 (Screw) ×2 IMPLANT
SCREW RELINE-O POLY 6.5X40 (Screw) ×7 IMPLANT
SCREW RELINE-O POLY 6.5X45 (Screw) ×1 IMPLANT
SEALANT ADHERUS EXTEND TIP (MISCELLANEOUS) ×1 IMPLANT
SPIKE FLUID TRANSFER (MISCELLANEOUS) ×2 IMPLANT
SPONGE SURGIFOAM ABS GEL 100 (HEMOSTASIS) ×3 IMPLANT
SPONGE T-LAP 4X18 ~~LOC~~+RFID (SPONGE) IMPLANT
STRIP CLOSURE SKIN 1/2X4 (GAUZE/BANDAGES/DRESSINGS) IMPLANT
SUT ETHILON 3 0 FSL (SUTURE) ×2 IMPLANT
SUT PROLENE 6 0 BV (SUTURE) IMPLANT
SUT VIC AB 0 CT1 18XCR BRD8 (SUTURE) ×2 IMPLANT
SUT VIC AB 0 CT1 8-18 (SUTURE) ×6
SUT VIC AB 2-0 CT1 18 (SUTURE) ×5 IMPLANT
SUT VIC AB 3-0 SH 8-18 (SUTURE) ×3 IMPLANT
TEMPLATE ROD SILICON 250 (ORTHOPEDIC DISPOSABLE SUPPLIES) ×1 IMPLANT
TOWEL GREEN STERILE (TOWEL DISPOSABLE) ×3 IMPLANT
TOWEL GREEN STERILE FF (TOWEL DISPOSABLE) ×3 IMPLANT
WATER STERILE IRR 1000ML POUR (IV SOLUTION) ×3 IMPLANT

## 2022-03-22 NOTE — Anesthesia Postprocedure Evaluation (Signed)
Anesthesia Post Note  Patient: [name]  Procedure(s) Performed: Thoracic ten to Lumbar three Posterior lateral arthrodesis with screws (Bilateral)     Patient location during evaluation: PACU Anesthesia Type: General Level of consciousness: patient cooperative and awake Pain management: pain level controlled Vital Signs Assessment: post-procedure vital signs reviewed and stable Respiratory status: spontaneous breathing, nonlabored ventilation, respiratory function stable and patient connected to nasal cannula oxygen Cardiovascular status: blood pressure returned to baseline and stable Postop Assessment: no apparent nausea or vomiting Anesthetic complications: no   No notable events documented.  Last Vitals:  Vitals:   03/22/22 1755 03/22/22 1800  BP:  (!) 147/78  Pulse: 66 67  Resp:  19  Temp:    SpO2: 96% 95%    Last Pain:  Vitals:   03/22/22 1755  TempSrc:   PainSc: Asleep                 [name]

## 2022-03-22 NOTE — Progress Notes (Signed)
Pharmacy Antibiotic Note  [name] is a 61 y.o. female admitted on 03/22/2022 with arthrodesis L1/2.  Pharmacy has been consulted for vancomycin dosing for surgical prophylaxis. No drain in place per RN.   Plan: Vancomycin 1250mg  x1    Height: 5\' 3"  (160 cm) Weight: 93.9 kg (207 lb) IBW/kg (Calculated) : 52.4  Temp (24hrs), Avg:97.6 F (36.4 C), Min:97 F (36.1 C), Max:98.1 F (36.7 C)  No results for input(s): "WBC", "CREATININE", "LATICACIDVEN", "VANCOTROUGH", "VANCOPEAK", "VANCORANDOM", "GENTTROUGH", "GENTPEAK", "GENTRANDOM", "TOBRATROUGH", "TOBRAPEAK", "TOBRARND", "AMIKACINPEAK", "AMIKACINTROU", "AMIKACIN" in the last 168 hours.  Estimated Creatinine Clearance: 82.5 mL/min (by C-G formula based on SCr of 0.8 mg/dL).    Allergies  Allergen Reactions   Monosodium Glutamate Anaphylaxis and Swelling    Eyes swollen shut, facial swelling, tongue swelling.   Shellfish Allergy Anaphylaxis   Baclofen Other (See Comments)   Celebrex [Celecoxib] Itching    Only allergic to generic brand   Contrast Media [Iodinated Contrast Media] Itching and Nausea Only    "could not walk"   Diclofenac Itching    Generic Diclefenac gel causes itching. Can take the name brand Voltaren gel   Molds & Smuts Other (See Comments)   Other Other (See Comments)    Pet dander, Mildew, Mold  Cannot tolerate generic DICLOFENAC GEL--MUST USE BRAND NAME (GENERIC CAUSES ITCHING)    Sulfa Antibiotics    Latex Itching and Rash    Latex glove with powder    Thank you for allowing pharmacy to be a part of this patient's care.  , PharmD, BCPS Clinical Pharmacist 03/22/2022 5:44 PM

## 2022-03-22 NOTE — Transfer of Care (Signed)
Immediate Anesthesia Transfer of Care Note  Patient: [name]  Procedure(s) Performed: Thoracic ten to Lumbar three Posterior lateral arthrodesis with screws (Bilateral)  Patient Location: PACU  Anesthesia Type:General  Level of Consciousness: sedated  Airway & Oxygen Therapy: Patient Spontanous Breathing  Post-op Assessment: Report given to RN and Post -op Vital signs reviewed and stable  Post vital signs: Reviewed and stable  Last Vitals:  Vitals Value Taken Time  BP 131/69 03/22/22 1630  Temp    Pulse 63 03/22/22 1635  Resp 15 03/22/22 1635  SpO2 97 % 03/22/22 1635  Vitals shown include unvalidated device data.  Last Pain:  Vitals:   03/22/22 0735  TempSrc:   PainSc: 10-Worst pain ever      Patients Stated Pain Goal: 0 (03/22/22 0735)  Complications: No notable events documented.

## 2022-03-22 NOTE — H&P (Signed)
BP 134/72   Pulse 66   Temp 98.1 F (36.7 C) (Oral)   Resp 18   Ht _0  (1.6 m)   Wt 93.9 kg   LMP 11/20/2012   SpO2 99%   BMI 36.67 kg/m  [name] returns today.  She did have injections at L1-2.  She had found no relief.  She has degenerative scoliosis at this point, near bone on bone at L1-2, 2-3 still appears to be pseudoarthrosed.  I think she does need an operation.  The only canal work she needs is at L1-2 in the hopes of getting a cage in there, but other than that, she would need screws, I think to approximately T10 since I would cross the junction at 12-11, and I would have to just hope that this is going to help.  I will wait for her phone call about this.    She is alert, oriented by 4.  Answers all questions appropriately.  Uses a walker.  Walks quite slowly, but is very stable.  Normal muscle tone, bulk, coordination.  Speech is clear, it is also fluent.  Strength is 5/5, pain limited in lower extremities Allergies  Allergen Reactions   Monosodium Glutamate Anaphylaxis and Swelling    Eyes swollen shut, facial swelling, tongue swelling.   Shellfish Allergy Anaphylaxis   Baclofen Other (See Comments)   Celebrex [Celecoxib] Itching    Only allergic to generic brand   Contrast Media [Iodinated Contrast Media] Itching and Nausea Only    "could not walk"   Diclofenac Itching    Generic Diclefenac gel causes itching. Can take the name brand Voltaren gel   Molds & Smuts Other (See Comments)   Other Other (See Comments)    Pet dander, Mildew, Mold  Cannot tolerate generic DICLOFENAC GEL--MUST USE BRAND NAME (GENERIC CAUSES ITCHING)    Sulfa Antibiotics    Latex Itching and Rash    Latex glove with powder   Family History  Problem Relation Age of Onset   Stroke Father        passed 2004 from pneumonia   Alcohol abuse Father    Pulmonary embolism Mother        after minor knee surgery leading to DVT   Arthritis Sister    Depression Sister    Hypertension Sister     Diabetes Other        grandmother   Anesthesia problems Neg Hx    Hypotension Neg Hx    Malignant hyperthermia Neg Hx    Pseudochol deficiency Neg Hx    Social History   Socioeconomic History   Marital status: Divorced    Spouse name: Not on file   Number of children: 1   Years of education: Not on file   Highest education level: Not on file  Occupational History   Not on file  Tobacco Use   Smoking status: Former    Packs/day: 0.50    Years: 10.00    Total pack years: 5.00    Types: Cigarettes    Quit date: 12/06/1998    Years since quitting: 23.3   Smokeless tobacco: Never  Vaping Use   Vaping Use: Never used  Substance and Sexual Activity   Alcohol use: No   Drug use: No   Sexual activity: Not Currently    Birth control/protection: Post-menopausal  Other Topics Concern   Not on file  Social History Narrative   Divorced. 1 son.    Disabled from fibromyalgia, arthritis, back  and knee surgeries.    Graduated from High school.       Sister is a patient in our clinic, [place] [facility].    Recently cared for at [facility]. Changed to [place] as new network.    Social Determinants of Health   Financial Resource Strain: Not on file  Food Insecurity: Not on file  Transportation Needs: Not on file  Physical Activity: Not on file  Stress: Not on file  Social Connections: Not on file  Intimate Partner Violence: Not on file   Past Medical History:  Diagnosis Date   Anemia    Ankle syndesmosis disruption    Anxiety    takes Ativan and Valium, after mother passed   Arthritis    bilateral knees s/p knee replacement bilaterally   Asthma    Bronchitis    Bruising    pt states unexplained d/t fibromyalgia   Chronic back pain    2012 tailbone surgery and 3 lower discs.    Closed fracture of distal end of right fibula and tibia    Depression    from Fibromyalgia diagnosis; not taking medicine. since 2001   Dizziness    rarely   Fibromyalgia    diagnosed  2001   GERD (gastroesophageal reflux disease)    Prilosec occasionally   Headache(784.0)    "sinus headaches"   History of hiatal hernia    Hypertension    since 2013   Hyperthyroidism    subclinical, no treatment; thyroid nodules   IBS (irritable bowel syndrome)    Impaired memory    states from fibromyalgia   Insomnia    takes Ambien   Jones fracture    left foot fifth metatarsal   Multiple allergies    including latex, pet dander, shellfish, pet dander   Painful orthopaedic hardware right ankle 05/27/2016   Seasonal allergies    Shortness of breath    Occasional with exertion;    Sore gums    this is why pt is on Amoxil-only takes for dental work   Tachycardia    Thyroid goiter    Varicose vein    protrudes above skin-per pt;vein popped and bruised;ultrasound done to make sure that there were no clots;noclots were found   Past Surgical History:  Procedure Laterality Date   ANTERIOR CERVICAL DECOMP/DISCECTOMY FUSION N/A 12/08/2020   Procedure: Cervical Four-Five Anterior cervical decompression/discectomy/fusion;  Surgeon: Ashok Pall, MD;  Location: [place];  Service: Neurosurgery;  Laterality: N/A;  anterior   ANTERIOR LUMBAR FUSION  09/20/2011   Procedure: ANTERIOR LUMBAR FUSION 1 LEVEL;  Surgeon: Winfield Cunas;  Location: [place] [facility]UR[facility]S;  Service: Neurosurgery;  Laterality: N/A;  Lumbar five-Sacral One Anterior Lumbar Interbody Fusion /Dr. Early to Approach    CERVICAL [place] PLATE IN NECK---LEFT SIDE   CERVICAL SPINE SURGERY  03/24/2019   CESAREAN SECTION     EYE SURGERY Bilateral 2023   cataract   fibroidectomy     FRACTURE SURGERY  05/08/2010   Jones fracture left foot fifth metatarsal   HARDWARE REMOVAL Right 05/28/2016   Procedure: RIGHT ANKLE HARDWARE REMOVAL;  Surgeon: Elsie Saas, MD;  Location: [place];  Service: Orthopedics;  Laterality: Right;   HARDWARE REMOVAL N/A 07/28/2020   Procedure: Removal of Lumbar  Hardware;  Surgeon: Ashok Pall, MD;  Location: [place];  Service: Neurosurgery;  Laterality: N/A;   HERNIA REPAIR     hiatal hernia   IRRIGATION  AND DEBRIDEMENT KNEE  04/09/2012   Procedure: IRRIGATION AND DEBRIDEMENT KNEE;  Surgeon: Yvette Rack., MD;  Location: [place];  Service: Orthopedics;  Laterality: Right;   JOINT REPLACEMENT  01/27/2012   left total knee and Right total knee   KNEE SURGERY  2005   Left knee arthroscopy   NASAL SEPTOPLASTY W/ TURBINOPLASTY  2007   due to recurrent sinusitis   ORIF ANKLE FRACTURE Right 06/21/2015   Procedure: OPEN REDUCTION INTERNAL FIXATION RIGHT DISTAL FIBULA  FRACTURE AND OPEN REDUCTION INTERNAL FIXATION SYNDESMOSIS ;  Surgeon: Elsie Saas, MD;  Location: [place];  Service: Orthopedics;  Laterality: Right;   PARTIAL HYSTERECTOMY     SHOULDER ARTHROSCOPY W/ ROTATOR CUFF REPAIR Right    SHOULDER SURGERY Left    SPINE SURGERY  2004   Cervical plate, ACDF   STERIOD INJECTION  01/27/2012   Procedure: STEROID INJECTION;  Surgeon: Lorn Junes, MD;  Location: [place];  Service: Orthopedics;  Laterality: Right;   TEE WITHOUT CARDIOVERSION N/A 03/01/2020   Procedure: TRANSESOPHAGEAL ECHOCARDIOGRAM (TEE);  Surgeon: Jerline Pain, MD;  Location[facility];  Service: Cardiovascular;  Laterality: N/A;   THORACIC DISCECTOMY  02/16/2020   Procedure: Thoracic ten-eleven Discectomy;  Surgeon: Ashok Pall, MD;  Location: [place];  Service: Neurosurgery;;   TONSILLECTOMY  2007   TOTAL KNEE ARTHROPLASTY  01/27/2012   Procedure: TOTAL KNEE ARTHROPLASTY;  Surgeon: Lorn Junes, MD;  Bilateral   TOTAL KNEE ARTHROPLASTY  04/06/2012   Procedure: TOTAL KNEE ARTHROPLASTY;  Surgeon: Lorn Junes, MD;  Location: [place];  Service: Orthopedics;  Laterality: Right;   TUBAL LIGATION     UTERINE FIBROID SURGERY     mid 200s   WOUND EXPLORATION N/A 02/27/2020   Procedure: WOUND EXPLORATION;  Surgeon: Ashok Pall, MD;  Location: [place];   Service: Neurosurgery;  Laterality: N/A;,  (wound vac upper back)   Prior to Admission medications   Medication Sig Start Date End Date Taking? Authorizing Provider  acetaminophen (TYLENOL) 500 MG tablet Take 1,000 mg by mouth every 8 (eight) hours as needed for moderate pain.   Yes [provider]  albuterol (VENTOLIN HFA) 108 (90 Base) MCG/ACT inhaler Inhale 2 puffs into the lungs every 6 (six) hours as needed for wheezing or shortness of breath. 03/24/20  Yes Medina-Vargas, Monina C, NP  ascorbic acid (VITAMIN C) 500 MG tablet Take 500 mg by mouth daily.   Yes [provider]  aspirin EC 81 MG tablet Take 81 mg by mouth daily.   Yes [provider]  atenolol (TENORMIN) 50 MG tablet Take 1 tablet (50 mg total) by mouth daily. 03/24/20  Yes Medina-Vargas, Monina C, NP  CELEBREX 200 MG capsule Take 1 capsule (200 mg total) by mouth 2 (two) times daily. 03/24/20  Yes Medina-Vargas, Monina C, NP  chlorzoxazone (PARAFON) 500 MG tablet Take 1 tablet (500 mg total) by mouth 3 (three) times daily. 03/24/20  Yes Medina-Vargas, Monina C, NP  cyclobenzaprine (FLEXERIL) 10 MG tablet Take 10 mg by mouth 3 (three) times daily.   Yes [provider]  diazepam (VALIUM) 5 MG tablet Take 1 tablet (5 mg total) by mouth 2 (two) times daily. 03/22/20  Yes Medina-Vargas, Monina C, NP  diclofenac (FLECTOR) 1.3 % PTCH Place 1 patch onto the skin 2 (two) times daily as needed (pain.).   Yes [provider]  Diethylpropion HCl CR 75 MG TB24 Take 75 mg by mouth daily. 05/17/21  Yes [provider]  diphenhydrAMINE (BENADRYL) 25 MG tablet Take 50 mg by mouth in the morning, at noon, in the evening, and at bedtime.   Yes [provider]  furosemide (LASIX) 20 MG tablet Take 2 tablets (40 mg total) by mouth daily. 03/24/20  Yes Medina-Vargas, Monina C, NP  gabapentin (NEURONTIN) 600 MG tablet Take 2 tablets (1,200 mg total) by mouth 3 (three) times daily. 03/24/20  Yes  Medina-Vargas, Monina C, NP  guaiFENesin (MUCINEX) 600 MG 12 hr tablet Take 600 mg by mouth 2 (two) times daily as needed for cough.   Yes [provider]  hydrochlorothiazide (HYDRODIURIL) 25 MG tablet Take 1 tablet (25 mg total) by mouth daily. One tablet by mouth in the morning. Patient taking differently: Take 12.5 mg by mouth daily. 03/24/20  Yes Medina-Vargas, Monina C, NP  hydrOXYzine (ATARAX/VISTARIL) 50 MG tablet Take 50 mg by mouth 2 (two) times daily. 06/23/20  Yes [provider]  lubiprostone (AMITIZA) 24 MCG capsule Take 1 capsule (24 mcg total) by mouth 2 (two) times daily with a meal. 03/24/20  Yes Medina-Vargas, Monina C, NP  Multiple Vitamin (MULTIVITAMIN WITH MINERALS) TABS tablet Take 1 tablet by mouth daily.   Yes [provider]  Multiple Vitamins-Minerals (EMERGEN-C IMMUNE PLUS/VIT D) CHEW Chew 3 each by mouth daily.   Yes [provider]  NARCAN 4 MG/0.1ML LIQD nasal spray kit Place 1 spray into the nose once. 05/26/20  Yes [provider]  omeprazole (PRILOSEC) 20 MG capsule Take 20 mg by mouth in the morning and at bedtime.    Yes [provider]  oxyCODONE-acetaminophen (PERCOCET) 10-325 MG tablet Take 1 tablet by mouth every 6 (six) hours as needed for pain. Patient taking differently: Take 1 tablet by mouth 5 (five) times daily. 03/22/20  Yes Medina-Vargas, Monina C, NP  potassium chloride (KLOR-CON) 10 MEQ tablet Take 1 tablet (10 mEq total) by mouth daily. 03/24/20  Yes Medina-Vargas, Monina C, NP  Propylene Glycol (SYSTANE COMPLETE) 0.6 % SOLN Place 1 drop into both eyes daily as needed (dry eyes).   Yes [provider]  zolpidem (AMBIEN) 10 MG tablet Take 10 mg by mouth at bedtime as needed for sleep.   Yes [provider]  diclofenac Sodium (VOLTAREN) 1 % GEL Apply 2 g topically 4 (four) times daily as needed (pain).    [provider]  senna (SENOKOT) 8.6 MG TABS tablet Take 1 tablet by mouth  2 (two) times daily as needed (constipation.).     [provider]   OR for Arthrodesis L1/2. Risks and benefits explained, and she is well versed in this procedure. She would like to proceed

## 2022-03-22 NOTE — Anesthesia Procedure Notes (Signed)
Procedure Name: Intubation Date/Time: 03/22/2022 9:20 AM  Performed by: Minerva Ends, CRNAPre-anesthesia Checklist: Patient identified, Emergency Drugs available, Suction available and Patient being monitored Patient Re-evaluated:Patient Re-evaluated prior to induction Oxygen Delivery Method: Circle system utilized Preoxygenation: Pre-oxygenation with 100% oxygen Induction Type: IV induction Ventilation: Mask ventilation without difficulty Laryngoscope Size: Mac and 3 Grade View: Grade I Tube type: Oral Tube size: 7.0 mm Number of attempts: 1 Airway Equipment and Method: Stylet and Oral airway Placement Confirmation: ETT inserted through vocal cords under direct vision, positive ETCO2 and breath sounds checked- equal and bilateral Secured at: 23 cm Tube secured with: Tape Dental Injury: Teeth and Oropharynx as per pre-operative assessment

## 2022-03-23 LAB — CBC
HCT: 28.9 % — ABNORMAL LOW (ref 36.0–46.0)
Hemoglobin: 8.5 g/dL — ABNORMAL LOW (ref 12.0–15.0)
MCH: 22 pg — ABNORMAL LOW (ref 26.0–34.0)
MCHC: 29.4 g/dL — ABNORMAL LOW (ref 30.0–36.0)
MCV: 74.9 fL — ABNORMAL LOW (ref 80.0–100.0)
Platelets: 310 10*3/uL (ref 150–400)
RBC: 3.86 MIL/uL — ABNORMAL LOW (ref 3.87–5.11)
RDW: 19.5 % — ABNORMAL HIGH (ref 11.5–15.5)
WBC: 13.7 10*3/uL — ABNORMAL HIGH (ref 4.0–10.5)
nRBC: 0 % (ref 0.0–0.2)

## 2022-03-23 MED ORDER — HYDROCORTISONE 1 % EX CREA: TOPICAL_CREAM | CUTANEOUS | Status: DC | PRN

## 2022-03-23 MED ORDER — OXYCODONE HCL ER 15 MG PO T12A
40.0000 mg | EXTENDED_RELEASE_TABLET | Freq: Two times a day (BID) | ORAL | Status: DC
  Administered 2022-03-23 – 2022-05-01 (×77): 40 mg via ORAL
  Filled 2022-03-23 (×78): qty 1

## 2022-03-23 MED ORDER — SODIUM CHLORIDE 0.9 % IV BOLUS
500.0000 mL | Freq: Once | INTRAVENOUS | Status: AC
Start: 2022-03-23 — End: 2022-03-23
  Administered 2022-03-23: 500 mL via INTRAVENOUS

## 2022-03-23 MED ORDER — ORAL CARE MOUTH RINSE: 15.0000 mL | OROMUCOSAL | Status: DC | PRN

## 2022-03-23 MED ORDER — DIPHENHYDRAMINE HCL 25 MG PO CAPS
50.0000 mg | ORAL_CAPSULE | ORAL | Status: DC | PRN
Start: 2022-03-23 — End: 2022-05-02
  Administered 2022-03-23 – 2022-05-01 (×84): 50 mg via ORAL
  Filled 2022-03-23 (×84): qty 2

## 2022-03-23 MED ORDER — CYCLOBENZAPRINE HCL 10 MG PO TABS
10.0000 mg | ORAL_TABLET | Freq: Three times a day (TID) | ORAL | Status: DC
  Administered 2022-03-23 – 2022-04-19 (×79): 10 mg via ORAL
  Filled 2022-03-23 (×79): qty 1

## 2022-03-23 NOTE — Progress Notes (Signed)
PT Cancellation Note  Patient Details Name: [name] MRN: [id number] DOB: Sep 07, 1961   Cancelled Treatment:    Reason Eval/Treat Not Completed: Active bedrest order (RN to let PT/OT know if lifted later in the day) Corinna Capra, PT, DPT  Acute Rehabilitation  [phone] pager #[phone] office     Lurena Joiner B Isabele Lollar 03/23/2022, 1:30 PM

## 2022-03-23 NOTE — Progress Notes (Signed)
Patient ID: [name], female   DOB: 26-Dec-1960, 61 y.o.   MRN: [id number] Vital signs are stable [name] does seem to have some drainage from her incision Her dressings have been changed on 2 occasions For the current time it seems to be reasonably dry She remains flat at bedrest Motor function appears stable Continue supportive care

## 2022-03-23 NOTE — Progress Notes (Signed)
Dr. Danielle Dess notified of back dressing being saturated with serosanguinous fluid through to bottom sheet. RN changed dressing, cleaning with iodine solution and redressing with gauze and ABD per Dr. Verlee Rossetti instructions. Order given for CBC at 0500 tomorrow morning.

## 2022-03-23 NOTE — Progress Notes (Signed)
Patient's honeycomb dressing to the back is completely saturated with blood. She says she has had problems with bleeding in the past. Dr. Danielle Dess notified. Verbal to change dressing and collect a CBC in the AM. RN will continue to monitor.

## 2022-03-23 NOTE — Progress Notes (Signed)
OT Cancellation Note  Patient Details Name: [name] MRN: [id number] DOB: August 01, 1961   Cancelled Treatment:    Reason Eval/Treat Not Completed: Active bedrest order (Pt has active bed rest with log roll only orders; OT evaluation to f/u as activity orders progress.)  Lelon Mast A Laqueshia Cihlar 03/23/2022, 7:28 AM

## 2022-03-23 NOTE — Progress Notes (Addendum)
1100: Dr. Danielle Dess notified of downward trend of BP with current measurement 92/50 (64) after BP medication admin at 0900. Per Dr. Danielle Dess, RN to continue to encourage fluid intake and monitor for further decrease in BP. Hold further doses of furosemide.   1130: Dr. Danielle Dess aware of small ooze from back incision. Dressing changed during back and site cleaned per Dr. Verlee Rossetti instructions. New dressing applied 1130 noted to be clean dry intact.

## 2022-03-23 NOTE — Progress Notes (Signed)
Dr. Danielle Dess notified of BP 87/52 (64). Pt asymptomatic. Order given for 500 mL normal saline bolus.

## 2022-03-24 LAB — CBC
HCT: 27.2 % — ABNORMAL LOW (ref 36.0–46.0)
Hemoglobin: 8 g/dL — ABNORMAL LOW (ref 12.0–15.0)
MCH: 22.2 pg — ABNORMAL LOW (ref 26.0–34.0)
MCHC: 29.4 g/dL — ABNORMAL LOW (ref 30.0–36.0)
MCV: 75.3 fL — ABNORMAL LOW (ref 80.0–100.0)
Platelets: 287 10*3/uL (ref 150–400)
RBC: 3.61 MIL/uL — ABNORMAL LOW (ref 3.87–5.11)
RDW: 19.6 % — ABNORMAL HIGH (ref 11.5–15.5)
WBC: 13.3 10*3/uL — ABNORMAL HIGH (ref 4.0–10.5)
nRBC: 0 % (ref 0.0–0.2)

## 2022-03-24 MED ORDER — CHLORHEXIDINE GLUCONATE CLOTH 2 % EX PADS
6.0000 | MEDICATED_PAD | Freq: Every day | CUTANEOUS | Status: DC
Start: 2022-03-25 — End: 2022-05-02
  Administered 2022-03-25 – 2022-05-01 (×36): 6 via TOPICAL

## 2022-03-24 NOTE — Progress Notes (Signed)
Patient ID: [name], female   DOB: 1961/01/21, 61 y.o.   MRN: [id number] Dressing was checked today and there is moderate serosanguineous drainage on the dressing.  Incision is painted with Betadine and dry dressing reapplied.  Continues to maintain bedrest and logroll from side to side.

## 2022-03-24 NOTE — Progress Notes (Signed)
OT Cancellation Note  Patient Details Name: [name] MRN: [id number] DOB: Dec 27, 1960   Cancelled Treatment:    Reason Eval/Treat Not Completed: Active bedrest order (Remains on bed rest, will continue OT evaluation efforts)  Estera Ozier A Huston Stonehocker 03/24/2022, 2:43 PM

## 2022-03-24 NOTE — Progress Notes (Signed)
PT Cancellation Note  Patient Details Name: [name] MRN: [id number] DOB: [birth year]   Cancelled Treatment:    Reason Eval/Treat Not Completed: Active bedrest order  Lillia Pauls, PT, DPT Acute Rehabilitation Services Office [phone]    Norval Morton 03/24/2022, 10:55 AM

## 2022-03-25 NOTE — Progress Notes (Signed)
PT Cancellation Note  Patient Details Name: [name] MRN: [id number] DOB: [birth year]   Cancelled Treatment:    Reason Eval/Treat Not Completed: Active bedrest order remains this morning, will continue to follow and evaluate when appropriate.   Vickki Muff, PT, DPT   Acute Rehabilitation Department   Ronnie Derby 03/25/2022, 7:55 AM

## 2022-03-25 NOTE — Progress Notes (Signed)
OT Cancellation Note  Patient Details Name: [name] MRN: [id number] DOB: [birth year]   Cancelled Treatment:    Reason Eval/Treat Not Completed: Active bedrest order  [name] 03/25/2022, 9:49 AM

## 2022-03-25 NOTE — Progress Notes (Signed)
Patient ID: [name], female   DOB: January 13, 1961, 61 y.o.   MRN: [id number] BP 102/66   Pulse 69   Temp (!) 97.4 F (36.3 C) (Axillary)   Resp (!) 7   Ht 5\' 3"  (1.6 m)   Wt 93.9 kg   LMP 11/20/2012   SpO2 100%   BMI 36.67 kg/m  Alert and oriented x 4, speech is clear and fluent Moving all extremities Patient was in reverse trendelenburg, this was not the intent of the order stating patient should be flat in bed.  Wound dressing is dry Will get up tomorrow

## 2022-03-26 ENCOUNTER — Encounter (HOSPITAL_COMMUNITY): Payer: Self-pay | Admitting: Neurosurgery

## 2022-03-26 NOTE — Progress Notes (Signed)
Patient ID: [name], female   DOB: [birth year], 61 y.o.   MRN: [id number] BP 118/66   Pulse 80   Temp 97.6 F (36.4 C) (Oral)   Resp 18   Ht 5\' 3"  (1.6 m)   Wt 93.9 kg   LMP 11/20/2012   SpO2 97%   BMI 36.67 kg/m  Alert and oriented Working with physical and occupational therapy Home health recommend Continue to mobilize

## 2022-03-26 NOTE — Evaluation (Signed)
Occupational Therapy Evaluation Patient Details Name: [name] MRN: [id number] DOB: [birth year] Today's Date: 03/26/2022   History of Present Illness [name] who underwent thoracic ten to Lumbar three Posterior lateral arthrodesis with screws 6/16. PMH: anemia, anxiety, fibromyalgia, GERD, HTN, hyperthyroidism, IBS, ACDF 3/4   Clinical Impression   [name] was evaluated s/p the above admission list, she is generally mod I at baseline with use of DME for ADLs and mobility. She lives alone, but had good support from family and neighbors. Upon evaluation pt had functional limitations due to back pain, knowledge of back precautions, general weakness, and decreased activity tolerance. Overall she required mod A +2 to get to the EOB and min A +2 with RW for transfers and mobility. Due to deficits listed below, she requires up to max A for ADLs. She will benefit from OT acutely. Recommend d/c to home with [facility] and an PCA if possible.      Recommendations for follow up therapy are one component of a multi-disciplinary discharge planning process, led by the attending physician.  Recommendations may be updated based on patient status, additional functional criteria and insurance authorization.   Follow Up Recommendations  Home health OT    Assistance Recommended at Discharge Intermittent Supervision/Assistance  Patient can return home with the following A little help with walking and/or transfers;A little help with bathing/dressing/bathroom;Assistance with cooking/housework;Help with stairs or ramp for entrance;Assist for transportation    Functional Status Assessment  Patient has had a recent decline in their functional status and demonstrates the ability to make significant improvements in function in a reasonable and predictable amount of time.  Equipment Recommendations  None recommended by OT (pt is well equipped)       Precautions / Restrictions Precautions Precautions:  Fall;Back Precaution Booklet Issued: No Precaution Comments: verbally reviewed - pt familiar from previous surgeries Restrictions Weight Bearing Restrictions: No      Mobility Bed Mobility Overal bed mobility: Needs Assistance Bed Mobility: Sidelying to Sit, Rolling Rolling: Min assist Sidelying to sit: Mod assist, +2 for physical assistance            Transfers Overall transfer level: Needs assistance Equipment used: Rolling walker (2 wheels) Transfers: Sit to/from Stand Sit to Stand: Min assist, +2 safety/equipment                  Balance Overall balance assessment: Needs assistance Sitting-balance support: Feet supported Sitting balance-[name] Scale: Good     Standing balance support: Bilateral upper extremity supported, During functional activity Standing balance-[name] Scale: Poor                             ADL either performed or assessed with clinical judgement   ADL Overall ADL's : Needs assistance/impaired Eating/Feeding: Independent;Sitting   Grooming: Min guard;Standing   Upper Body Bathing: Minimal assistance;Cueing for compensatory techniques;Sitting   Lower Body Bathing: Maximal assistance;Sit to/from stand;Cueing for back precautions;Cueing for compensatory techniques   Upper Body Dressing : Set up;Sitting   Lower Body Dressing: Maximal assistance;Cueing for compensatory techniques;Cueing for back precautions;Sit to/from stand   Toilet Transfer: Minimal assistance;Ambulation;Rolling walker (2 wheels);Regular Toilet   Toileting- Clothing Manipulation and Hygiene: Min guard;Sitting/lateral lean;Adhering to back precautions       Functional mobility during ADLs: Minimal assistance;Rolling walker (2 wheels) General ADL Comments: assist for back precautions, pain management, general weakness and safety     Vision Baseline Vision/History: 0 No visual deficits Vision  Assessment?: No apparent visual deficits             Pertinent Vitals/Pain Pain Assessment Pain Assessment: Faces Faces Pain Scale: Hurts even more Pain Location: back, BLEs Pain Descriptors / Indicators: Discomfort, Tightness Pain Intervention(s): Limited activity within patient's tolerance, Monitored during session     Hand Dominance Right   Extremity/Trunk Assessment Upper Extremity Assessment Upper Extremity Assessment: LUE deficits/detail;RUE deficits/detail RUE Deficits / Details: hx of neuropathy, ROM and MMT overall WFL. coordination and dexterity are mildly limited RUE Sensation: decreased light touch;history of peripheral neuropathy RUE Coordination: decreased fine motor LUE Deficits / Details: hx of neuropathy, ROM and MMT overall WFL. coordination and dexterity are mildly limited LUE Sensation: decreased light touch;history of peripheral neuropathy LUE Coordination: decreased fine motor   Lower Extremity Assessment Lower Extremity Assessment: Defer to PT evaluation   Cervical / Trunk Assessment Cervical / Trunk Assessment: Back Surgery   Communication Communication Communication: No difficulties   Cognition Arousal/Alertness: Awake/alert Behavior During Therapy: WFL for tasks assessed/performed Overall Cognitive Status: Within Functional Limits for tasks assessed             General Comments  SpO2 dropped to 88% on RA at the end of the session, RN present and donned 2L [place]            Home Living Family/patient expects to be discharged to:: Private residence Living Arrangements: Alone Available Help at Discharge: Family;Friend(s);Available PRN/intermittently Type of Home: House Home Access: Stairs to enter [employer] of Steps: 5 Entrance Stairs-Rails: Right;Left Home Layout: One level     Bathroom Shower/Tub: Chief Strategy Officer: Standard (with riser) Bathroom Accessibility: Yes How Accessible: Accessible via walker Home Equipment: Grab bars - tub/shower;Shower  seat;Toilet riser;Hand held Programmer, systems (2 wheels);Rollator (4 wheels);Cane - single point          Prior Functioning/Environment Prior Level of Function : Independent/Modified Independent             Mobility Comments: RW for mobility ADLs Comments: generally mod I with DME        OT Problem List: Decreased strength;Decreased range of motion;Impaired balance (sitting and/or standing);Decreased activity tolerance;Decreased safety awareness;Decreased knowledge of precautions;Pain      OT Treatment/Interventions: Self-care/ADL training;Therapeutic exercise;DME and/or AE instruction;Therapeutic activities;Patient/family education;Balance training    OT Goals(Current goals can be found in the care plan section) Acute Rehab OT Goals Patient Stated Goal: back to indep OT Goal Formulation: With patient Time For Goal Achievement: 04/09/22 Potential to Achieve Goals: Good ADL Goals Pt Will Perform Lower Body Bathing: with modified independence;sit to/from stand;with adaptive equipment Pt Will Perform Lower Body Dressing: with modified independence;sit to/from stand;with adaptive equipment Pt Will Transfer to Toilet: with modified independence;ambulating Additional ADL Goal #1: Pt will indep complete bed mobility with log roll technique as a precursor to ADLs  OT Frequency: Min 2X/week    Co-evaluation PT/OT/SLP Co-Evaluation/Treatment: Yes Reason for Co-Treatment: For patient/therapist safety   OT goals addressed during session: ADL's and self-care      AM-PAC OT "6 Clicks" Daily Activity     Outcome Measure Help from another person eating meals?: None Help from another person taking care of personal grooming?: A Little Help from another person toileting, which includes using toliet, bedpan, or urinal?: A Little Help from another person bathing (including washing, rinsing, drying)?: A Lot Help from another person to put on and taking off regular upper body  clothing?: A Little Help from another person to put  on and taking off regular lower body clothing?: A Lot 6 Click Score: 17   End of Session Equipment Utilized During Treatment: Gait belt;Rolling walker (2 wheels) Nurse Communication: Mobility status  Activity Tolerance: Patient tolerated treatment well Patient left: in chair;with call bell/phone within reach;with chair alarm set;with nursing/sitter in room  OT Visit Diagnosis: Unsteadiness on feet (R26.81);Muscle weakness (generalized) (M62.81);Pain                Time: 1458-1540 OT Time Calculation (min): 42 min Charges:  OT General Charges $OT Visit: 1 Visit OT Evaluation $OT Eval Moderate Complexity: 1 Mod OT Treatments $Therapeutic Activity: 8-22 mins  Alessandria Henken A Brentney Goldbach 03/26/2022, 4:00 PM

## 2022-03-26 NOTE — Evaluation (Signed)
Physical Therapy Evaluation Patient Details Name: [name] MRN: [id number] DOB: 20-Jan-1961 Today's Date: 03/26/2022  History of Present Illness  Janasha I Alvester Morin who underwent thoracic ten to Lumbar three Posterior lateral arthrodesis with screws 6/16. PMH: anemia, anxiety, fibromyalgia, GERD, HTN, hyperthyroidism, IBS, ACDF 3/4  Clinical Impression  Patient presents with decreased mobility due to deficits listed in PT problem list.  Currently min to mod A for mobility up OOB and walking into hallway.  First day up since Friday per pt due to bedrest post-surgery.  Previously walking independently with walker at home.  Patient reports family and friends able to assist at home so feel she could potentially return home with follow up HHPT at d/c.       Recommendations for follow up therapy are one component of a multi-disciplinary discharge planning process, led by the attending physician.  Recommendations may be updated based on patient status, additional functional criteria and insurance authorization.  Follow Up Recommendations Home health PT      Assistance Recommended at Discharge Intermittent Supervision/Assistance  Patient can return home with the following  A little help with walking and/or transfers;A little help with bathing/dressing/bathroom;Help with stairs or ramp for entrance;Assist for transportation    Equipment Recommendations None recommended by PT  Recommendations for Other Services       Functional Status Assessment Patient has had a recent decline in their functional status and demonstrates the ability to make significant improvements in function in a reasonable and predictable amount of time.     Precautions / Restrictions Precautions Precautions: Back;Fall Precaution Booklet Issued: No Precaution Comments: verbally reviewed - pt familiar from previous surgeries Restrictions Weight Bearing Restrictions: No Other Position/Activity Restrictions: no brace  needed      Mobility  Bed Mobility Overal bed mobility: Needs Assistance Bed Mobility: Sidelying to Sit, Rolling Rolling: Min assist Sidelying to sit: Mod assist, +2 for physical assistance       General bed mobility comments: cues for technique, assist for lifting trunk    Transfers Overall transfer level: Needs assistance Equipment used: Rolling walker (2 wheels) Transfers: Sit to/from Stand Sit to Stand: Min assist, +2 safety/equipment, From elevated surface           General transfer comment: assist for balance and some lifting    Ambulation/Gait Ambulation/Gait assistance: Min assist, +2 safety/equipment Gait Distance (Feet): 50 Feet Assistive device: Rolling walker (2 wheels) Gait Pattern/deviations: Step-to pattern, Step-through pattern, Knee flexed in stance - right       General Gait Details: after walking into hallway R knee buckling some so encouraged pt to limit further distance for safety, assisted to room in chair  Stairs            Wheelchair Mobility    Modified Rankin (Stroke Patients Only)       Balance Overall balance assessment: Needs assistance Sitting-balance support: Feet supported Sitting balance-[name]: Good     Standing balance support: Bilateral upper extremity supported, During functional activity, Reliant on assistive device for balance Standing balance-[name]: Poor                               Pertinent Vitals/Pain Pain Assessment Faces Pain Scale: Hurts even more Pain Location: back, BLEs Pain Descriptors / Indicators: Discomfort, Tightness Pain Intervention(s): Monitored during session, Repositioned, Limited activity within patient's tolerance    Home Living Family/patient expects to be discharged to:: Private residence Living Arrangements:  Alone Available Help at Discharge: Family;Friend(s);Available PRN/intermittently Type of Home: House Home Access: Stairs to enter Entrance  Stairs-Rails: Doctor, general practice of Steps: 5   Home Layout: One level Home Equipment: Grab bars - tub/shower;Shower seat;Toilet riser;Hand held Programmer, systems (2 wheels);Rollator (4 wheels);Gilmer Mor - single point Additional Comments: son with her till Monday then friends will assist    Prior Function Prior Level of Function : Independent/Modified Independent             Mobility Comments: RW for mobility ADLs Comments: generally mod I with DME     Hand Dominance   Dominant Hand: Right    Extremity/Trunk Assessment   Upper Extremity Assessment Upper Extremity Assessment: Defer to OT evaluation RUE Deficits / Details: hx of neuropathy, ROM and MMT overall WFL. coordination and dexterity are mildly limited RUE Sensation: decreased light touch;history of peripheral neuropathy RUE Coordination: decreased fine motor LUE Deficits / Details: hx of neuropathy, ROM and MMT overall WFL. coordination and dexterity are mildly limited LUE Sensation: decreased light touch;history of peripheral neuropathy LUE Coordination: decreased fine motor    Lower Extremity Assessment Lower Extremity Assessment: RLE deficits/detail;LLE deficits/detail RLE Deficits / Details: AROM WFL, mild stiffness in back of knees, some neuropathy in feet, bilateral healed knee scars from replacements; strength at least 3/5 throughout, not formally tested LLE Deficits / Details: AROM WFL, mild stiffness in back of knees, some neuropathy in feet, bilateral healed knee scars from replacements; strength at least 3/5 throughout, not formally tested    Cervical / Trunk Assessment Cervical / Trunk Assessment: Back Surgery  Communication   Communication: No difficulties  Cognition Arousal/Alertness: Awake/alert Behavior During Therapy: WFL for tasks assessed/performed Overall Cognitive Status: Within Functional Limits for tasks assessed                                           General Comments General comments (skin integrity, edema, etc.): SpO2 on RA throughout WNL, but dropped at end of session to 88%, RN replaced 2L O2  [place]    Exercises     Assessment/Plan    PT Assessment Patient needs continued PT services  PT Problem List Decreased strength;Decreased mobility;Decreased activity tolerance;Decreased balance;Pain;Decreased knowledge of use of DME;Decreased knowledge of precautions       PT Treatment Interventions DME instruction;Therapeutic activities;Patient/family education;Therapeutic exercise;Gait training;Balance training;Functional mobility training;Stair training    PT Goals (Current goals can be found in the Care Plan section)  Acute Rehab PT Goals Patient Stated Goal: to return home PT Goal Formulation: With patient Time For Goal Achievement: 04/09/22 Potential to Achieve Goals: Good    Frequency Min 5X/week     Co-evaluation PT/OT/SLP Co-Evaluation/Treatment: Yes Reason for Co-Treatment: For patient/therapist safety PT goals addressed during session: Mobility/safety with mobility;Proper use of DME OT goals addressed during session: ADL's and self-care       AM-PAC PT "6 Clicks" Mobility  Outcome Measure Help needed turning from your back to your side while in a flat bed without using bedrails?: A Little Help needed moving from lying on your back to sitting on the side of a flat bed without using bedrails?: A Little Help needed moving to and from a bed to a chair (including a wheelchair)?: A Little Help needed standing up from a chair using your arms (e.g., wheelchair or bedside chair)?: A Lot Help needed to walk in hospital room?: A  Little Help needed climbing 3-5 steps with a railing? : Total 6 Click Score: 15    End of Session Equipment Utilized During Treatment: Gait belt Activity Tolerance: Patient limited by fatigue Patient left: in chair;with call bell/phone within reach;with chair alarm set   PT Visit Diagnosis:  Difficulty in walking, not elsewhere classified (R26.2);Muscle weakness (generalized) (M62.81)    Time: 1500-1540 PT Time Calculation (min) (ACUTE ONLY): 40 min   Charges:   PT Evaluation $PT Eval Moderate Complexity: 1 Mod          [name], PT Acute Rehabilitation Services Pager:[phone] Office:[phone] 03/26/2022   [name] 03/26/2022, 5:23 PM

## 2022-03-26 NOTE — Progress Notes (Signed)
OT Cancellation Note  Patient Details Name: [name] MRN: [id number] DOB: Jul 01, 1961   Cancelled Treatment:    Reason Eval/Treat Not Completed: Active bedrest order  Lelon Mast A Trek Kimball 03/26/2022, 2:11 PM

## 2022-03-27 MED ORDER — FUROSEMIDE 40 MG PO TABS
40.0000 mg | ORAL_TABLET | ORAL | Status: DC
Start: 2022-03-28 — End: 2022-05-02
  Administered 2022-03-28 – 2022-05-01 (×34): 40 mg via ORAL
  Filled 2022-03-27 (×35): qty 1

## 2022-03-27 MED FILL — Heparin Sodium (Porcine) Inj 1000 Unit/ML: INTRAMUSCULAR | Qty: 30 | Status: AC

## 2022-03-27 MED FILL — Sodium Chloride IV Soln 0.9%: INTRAVENOUS | Qty: 1000 | Status: AC

## 2022-03-27 NOTE — Progress Notes (Signed)
Inpatient Rehab Admissions Coordinator:   Per therapy recommendations, patient was screened for CIR candidacy by Megan Salon, MS, CCC-SLP . At this time, Pt. does not appear to demonstrate medical necessity to justify a case to pt.'s payor for  in hospital rehabilitation/CIR. I will not pursue a rehab consult for this Pt.   Recommend other rehab venues to be pursued.  Please contact me with any questions.  Megan Salon, MS, CCC-SLP Rehab Admissions Coordinator  [phone] (celll) [phone] (office)

## 2022-03-27 NOTE — Progress Notes (Signed)
Patient ID: [name], female   DOB: May 07, 1961, 61 y.o.   MRN: [id number] BP (!) 133/119 (BP Location: Right Arm)   Pulse 69   Temp 98.6 F (37 C) (Oral)   Resp (!) 21   Ht 5\' 3"  (1.6 m)   Wt 93.9 kg   LMP 11/20/2012   SpO2 94%   BMI 36.67 kg/m  Alert and oriented x 4, speech is clear and fluent Moving all extremities Placement is complicated appreciate the help  Will continue with PT, OT

## 2022-03-27 NOTE — NC FL2 (Cosign Needed)
[place] MEDICAID FL2 LEVEL OF CARE SCREENING TOOL     IDENTIFICATION  Patient Name: [name] Birthdate: 31-Dec-1960 Sex: female Admission Date (Current Location): 03/22/2022  [facility] and [place] Number:  Producer, television/film/video and Address:  [facility] [address], [place], [name] [address]      Provider Number: [id number]  Attending Physician Name and Address:  Coletta Memos, MD  Relative Name and Phone Number:       Current Level of Care: Hospital Recommended Level of Care: Skilled Nursing Facility Prior Approval Number:    Date Approved/Denied:   PASRR Number: [id number]  Discharge Plan: SNF    Current Diagnoses: Patient Active Problem List   Diagnosis Date Noted   HNP (herniated nucleus pulposus), cervical 12/08/2020   Lumbago with sciatica, unspecified side 07/28/2020   PICC (peripherally inserted central catheter) in place 03/16/2020   Encounter for long-term (current) use of antibiotics 03/16/2020   Polypharmacy 03/09/2020   At risk for adverse drug event 03/09/2020   MSSA bacteremia 02/29/2020   Postoperative infection 02/28/2020   Abnormal gait due to muscle weakness 02/26/2020   HNP (herniated nucleus pulposus), lumbar 02/16/2020   Muscle weakness of lower extremity 02/14/2020   Spondylolisthesis of lumbar region 10/30/2017   Painful orthopaedic hardware right ankle 05/27/2016   Closed fracture of distal end of right fibula and tibia    Ankle syndesmosis disruption    ASCUS favor benign 01/06/2014   Leg swelling 12/22/2013   Seasonal allergies    Anemia    Chronic back pain due to DJD with history surgery    Hyperthyroidism    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 03/24/2012   Status post left total prosthetic replacement of knee joint using cement 03/24/2012   Fibromyalgia    GERD (gastroesophageal reflux disease)    Anxiety    Depression    Hypertension    Lumbar degenerative disc disease  09/25/2011    Orientation RESPIRATION BLADDER Height & Weight     Self, Time, Situation, Place  O2 (2L nasal cannula) Incontinent, External catheter Weight: 207 lb (93.9 kg) Height:  5\' 3"  (160 cm)  BEHAVIORAL SYMPTOMS/MOOD NEUROLOGICAL BOWEL NUTRITION STATUS      Continent Diet (see dc summary)  AMBULATORY STATUS COMMUNICATION OF NEEDS Skin   Limited Assist Verbally Surgical wounds (closed incision on back)                       Personal Care Assistance Level of Assistance  Bathing, Dressing, Feeding Bathing Assistance: Limited assistance Feeding assistance: Independent Dressing Assistance: Limited assistance     Functional Limitations Info  Sight Sight Info: Impaired        SPECIAL CARE FACTORS FREQUENCY  PT (By licensed PT), OT (By licensed OT)     PT Frequency: 5x/week OT Frequency: 5x/week            Contractures Contractures Info: Not present    Additional Factors Info  Code Status, Allergies Code Status Info: Full Allergies Info: Monosodium Glutamate, Shellfish Allergy, Baclofen, Celebrex (Celecoxib), Contrast Media (Iodinated Contrast Media), Diclofenac, Molds & Smuts, Other, Sulfa Antibiotics, Latex           Current Medications (03/27/2022):  This is the current hospital active medication list Current Facility-Administered Medications  Medication Dose Route Frequency Provider Last Rate Last Admin   0.9 %  sodium chloride infusion  250 mL Intravenous Continuous 03/29/2022, MD   Held  at 03/22/22 1815   acetaminophen (TYLENOL) tablet 650 mg  650 mg Oral Q4H PRN Coletta Memos, MD   650 mg at 03/23/22 2305   Or   acetaminophen (TYLENOL) suppository 650 mg  650 mg Rectal Q4H PRN Coletta Memos, MD       albuterol (PROVENTIL) (2.5 MG/3ML) 0.083% nebulizer solution 2.5 mg  2.5 mg Nebulization Q6H PRN Coletta Memos, MD       ascorbic acid (VITAMIN C) tablet 500 mg  500 mg Oral Daily Coletta Memos, MD   500 mg at 03/27/22 0919   aspirin EC tablet 81 mg   81 mg Oral Daily Coletta Memos, MD   81 mg at 03/27/22 0919   atenolol (TENORMIN) tablet 50 mg  50 mg Oral Daily Coletta Memos, MD   50 mg at 03/27/22 0919   bisacodyl (DULCOLAX) EC tablet 5 mg  5 mg Oral Daily PRN Coletta Memos, MD   5 mg at 03/27/22 1325   celecoxib (CELEBREX) capsule 200 mg  200 mg Oral BID Coletta Memos, MD   200 mg at 03/27/22 0920   Chlorhexidine Gluconate Cloth 2 % PADS 6 each  6 each Topical Q0600 Barnett Abu, MD   6 each at 03/27/22 0618   chlorzoxazone (PARAFON) tablet 500 mg  500 mg Oral TID Coletta Memos, MD   500 mg at 03/27/22 1543   cyclobenzaprine (FLEXERIL) tablet 10 mg  10 mg Oral Q8H Barnett Abu, MD   10 mg at 03/27/22 1326   diazepam (VALIUM) tablet 5 mg  5 mg Oral Q6H PRN Coletta Memos, MD   5 mg at 03/23/22 2108   diphenhydrAMINE (BENADRYL) capsule 50 mg  50 mg Oral Q4H PRN Coletta Memos, MD   50 mg at 03/27/22 0928   [START ON 03/28/2022] furosemide (LASIX) tablet 40 mg  40 mg Oral Q24H Coletta Memos, MD       gabapentin (NEURONTIN) tablet 1,200 mg  1,200 mg Oral TID Coletta Memos, MD   1,200 mg at 03/27/22 1543   guaiFENesin (MUCINEX) 12 hr tablet 600 mg  600 mg Oral BID PRN Coletta Memos, MD       heparin injection 5,000 Units  5,000 Units Subcutaneous Q8H Coletta Memos, MD   5,000 Units at 03/27/22 1325   hydrochlorothiazide (HYDRODIURIL) tablet 12.5 mg  12.5 mg Oral Daily Coletta Memos, MD   12.5 mg at 03/27/22 4008   hydrocortisone cream 1 %   Topical PRN Barnett Abu, MD       hydrOXYzine (ATARAX) tablet 50 mg  50 mg Oral BID Coletta Memos, MD   50 mg at 03/27/22 0920   lubiprostone (AMITIZA) capsule 24 mcg  24 mcg Oral BID WC Coletta Memos, MD   24 mcg at 03/27/22 1158   menthol-cetylpyridinium (CEPACOL) lozenge 3 mg  1 lozenge Oral PRN Coletta Memos, MD       Or   phenol (CHLORASEPTIC) mouth spray 1 spray  1 spray Mouth/Throat PRN Coletta Memos, MD       morphine (PF) 2 MG/ML injection 2 mg  2 mg Intravenous Q2H PRN Coletta Memos, MD   2 mg at  03/23/22 0201   multivitamin with minerals tablet 1 tablet  1 tablet Oral Daily Coletta Memos, MD   1 tablet at 03/27/22 0919   ondansetron (ZOFRAN) tablet 4 mg  4 mg Oral Q6H PRN Coletta Memos, MD       Or   ondansetron (ZOFRAN) injection 4 mg  4 mg Intravenous Q6H  PRN Coletta Memos, MD       Oral care mouth rinse  15 mL Mouth Rinse PRN Barnett Abu, MD       oxyCODONE (Oxy IR/ROXICODONE) immediate release tablet 10 mg  10 mg Oral Q3H PRN Coletta Memos, MD   10 mg at 03/27/22 1552   oxyCODONE (Oxy IR/ROXICODONE) immediate release tablet 5 mg  5 mg Oral Q3H PRN Coletta Memos, MD   5 mg at 03/24/22 0606   oxyCODONE (OXYCONTIN) 12 hr tablet 40 mg  40 mg Oral Q12H Coletta Memos, MD   40 mg at 03/27/22 0918   pantoprazole (PROTONIX) EC tablet 40 mg  40 mg Oral Daily Coletta Memos, MD   40 mg at 03/27/22 0919   polyvinyl alcohol (LIQUIFILM TEARS) 1.4 % ophthalmic solution 1 drop  1 drop Both Eyes Daily PRN Coletta Memos, MD       potassium chloride (KLOR-CON M) CR tablet 10 mEq  10 mEq Oral Daily Coletta Memos, MD   10 mEq at 03/27/22 1157   senna (SENOKOT) tablet 8.6 mg  1 tablet Oral BID PRN Coletta Memos, MD   8.6 mg at 03/26/22 1523   sodium chloride flush (NS) 0.9 % injection 3 mL  3 mL Intravenous Q12H Coletta Memos, MD   3 mL at 03/27/22 1158   sodium chloride flush (NS) 0.9 % injection 3 mL  3 mL Intravenous PRN Coletta Memos, MD       sodium phosphate (FLEET) 7-19 GM/118ML enema 1 enema  1 enema Rectal Once PRN Coletta Memos, MD       zolpidem (AMBIEN) tablet 5 mg  5 mg Oral QHS PRN Coletta Memos, MD         Discharge Medications: Please see discharge summary for a list of discharge medications.  Relevant Imaging Results:  Relevant Lab Results:   Additional Information SSN: [id number]  Renne Crigler Damarie [place]field, LCSW

## 2022-03-27 NOTE — Progress Notes (Signed)
Physical Therapy Treatment Patient Details Name: [name] MRN: [id number] DOB: [birth year] Today's Date: 03/27/2022   History of Present Illness [name] is a 61 y.o. female who underwent thoracic ten to Lumbar three Posterior lateral arthrodesis with screws 03/22/22. PMH: anemia, anxiety, fibromyalgia, GERD, HTN, hyperthyroidism, IBS, ACDF 3/4    PT Comments    Pt appears to be more lethargic and with decreased attention, memory, and problem-solving capabilities today, having difficulty to even problem-solve a need to look directly at the paper towel dispenser rather than in the mirror to find and grasp the paper towel. Pt needing reminders to maintain her back precautions also. She continues to display lower extremity weakness and balance deficits that place her at risk for falls, resulting in her requiring min-modA for transfers and minA to ambulate short household distances with a RW. Called pt's sister who reported pt will state she has assistance at home, but in reality she does not have any as the sister is also having physical issues of her own. Updated d/c recs to AIR in hopes of pt potentially achieving a mod I level with intensive therapy to allow her to d/c home as she is motivated, able to withstand 3 hours of therapy/day, and could make good progress in a short period of time. If she does not qualify for AIR then would recommend short-term rehab at a SNF.     Recommendations for follow up therapy are one component of a multi-disciplinary discharge planning process, led by the attending physician.  Recommendations may be updated based on patient status, additional functional criteria and insurance authorization.  Follow Up Recommendations  Acute inpatient rehab (3hours/day) (SNF if not AIR)     Assistance Recommended at Discharge Frequent or constant Supervision/Assistance  Patient can return home with the following A little help with bathing/dressing/bathroom;Help with  stairs or ramp for entrance;Assist for transportation;A lot of help with walking and/or transfers;Assistance with cooking/housework   Equipment Recommendations  None recommended by PT    Recommendations for Other Services Rehab consult     Precautions / Restrictions Precautions Precautions: Back;Fall Precaution Booklet Issued: Yes (comment) Precaution Comments: Verbally reviewed, needs reminders for compliance Required Braces or Orthoses:  (no brace needed) Restrictions Weight Bearing Restrictions: No Other Position/Activity Restrictions: no brace needed     Mobility  Bed Mobility               General bed mobility comments: Pt out of bed in chair upon arrival.    Transfers Overall transfer level: Needs assistance Equipment used: Rolling walker (2 wheels) Transfers: Sit to/from Stand Sit to Stand: Min assist, Mod assist           General transfer comment: Pt needing repeated cues to scoot to edge of chair or to hold onto grab bar in bathroom to stand. MinA to stand from recliner, modA from commode.    Ambulation/Gait Ambulation/Gait assistance: Min assist Gait Distance (Feet): 70 Feet (x2 bouts of ~20 ft > ~70 ft) Assistive device: Rolling walker (2 wheels) Gait Pattern/deviations: Step-through pattern, Decreased stride length, Decreased dorsiflexion - left, Trunk flexed, Narrow base of support Gait velocity: reduced Gait velocity interpretation: <1.31 ft/sec, indicative of household ambulator   General Gait Details: Pt needing repeated cues to widen stance and stand upright. Pt often dragging L foot and displaying knee instability, but no appreciative knee buckling. MinA for stability and to cue turning with RW.   Stairs  Wheelchair Mobility    Modified Rankin (Stroke Patients Only)       Balance Overall balance assessment: Needs assistance Sitting-balance support: Feet supported Sitting balance-[name]: Good     Standing  balance support: No upper extremity supported, During functional activity, Bilateral upper extremity supported Standing balance-[name]: Fair Standing balance comment: Able to stand briefly without UE support, but tends to rely on leaning her trunk/legs against a surface or UE support for dynamic tasks at sink or RW to ambulate.                            Cognition Arousal/Alertness: Awake/alert, Lethargic Behavior During Therapy: WFL for tasks assessed/performed Overall Cognitive Status: Impaired/Different from baseline Area of Impairment: Attention, Memory, Problem solving                   Current Attention Level: Selective Memory: Decreased recall of precautions, Decreased short-term memory       Problem Solving: Slow processing, Requires verbal cues, Difficulty sequencing General Comments: Pt awake but appearing lethargic, often closing eyes when sitting still in chair or on commode. At times, pt with speech unrelated to task or conversation at hand. Needs increased time and repeated cues to sequence tasks at times, like how to attend to and pull the paper towels from the dispenser. Needs reminders to maintain spinal precautions.        Exercises      General Comments General comments (skin integrity, edema, etc.): SpO2 down to 84% on RA at rest, SpO2 >/= 90% on 2L with activity (when waveforms were consistent); called pt's sister who reports pt has no assistance available at d/c as her sister has physical issues of her own and there is no one else who can help      Pertinent Vitals/Pain Pain Assessment Pain Assessment: Faces Faces Pain Scale: Hurts little more Pain Location: back Pain Descriptors / Indicators: Discomfort, Tightness, Grimacing, Operative site guarding Pain Intervention(s): Limited activity within patient's tolerance, Monitored during session, Repositioned    Home Living                          Prior Function             PT Goals (current goals can now be found in the care plan section) Acute Rehab PT Goals Patient Stated Goal: to improve PT Goal Formulation: With patient/family Time For Goal Achievement: 04/09/22 Potential to Achieve Goals: Good Progress towards PT goals: Progressing toward goals    Frequency    Min 5X/week      PT Plan Discharge plan needs to be updated    Co-evaluation              AM-PAC PT "6 Clicks" Mobility   Outcome Measure  Help needed turning from your back to your side while in a flat bed without using bedrails?: A Little Help needed moving from lying on your back to sitting on the side of a flat bed without using bedrails?: A Little Help needed moving to and from a bed to a chair (including a wheelchair)?: A Little Help needed standing up from a chair using your arms (e.g., wheelchair or bedside chair)?: A Lot Help needed to walk in hospital room?: A Little Help needed climbing 3-5 steps with a railing? : Total 6 Click Score: 15    End of Session Equipment Utilized During Treatment: Oxygen;Gait belt  Activity Tolerance: Patient limited by fatigue Patient left: in chair;with call bell/phone within reach Nurse Communication: Mobility status PT Visit Diagnosis: Difficulty in walking, not elsewhere classified (R26.2);Muscle weakness (generalized) (M62.81);Unsteadiness on feet (R26.81);Other abnormalities of gait and mobility (R26.89)     Time: 4801-6553 PT Time Calculation (min) (ACUTE ONLY): 39 min  Charges:  $Gait Training: 23-37 mins $Therapeutic Activity: 8-22 mins                     Raymond Gurney, PT, DPT Acute Rehabilitation Services  Office: [phone]    Jewel Baize 03/27/2022, 3:31 PM

## 2022-03-28 NOTE — TOC Initial Note (Signed)
Transition of Care [facility]) - Initial/Assessment Note    Patient Details  Name: [name] MRN: [id number] Date of Birth: [birth year]  Transition of Care [facility]) CM/SW Contact:    Mearl Latin, LCSW Phone Number: 03/28/2022, 5:23 PM  Clinical Narrative:                 CSW received consult for possible SNF placement at time of discharge. CSW spoke with patient for 45 minutes. Patient reported that she lives alone. CSW discussed therapy recommendations of SNF but patient recounted her experience at [facility] during COVID. CSW explained that the environment at SNF is less restrictive now and she can choose a different facility this time. She stated she wants to return home to her dog and her bed. She stated she has had multiple back surgeries before and has always had to get herself back to baseline and she enjoyed water therapy at [facility]. CSW asked how she would get out of the bed by herself and patient stated she will arrange people to assist her and her father had said he would transport her home. CSW encouraged her to go ahead and make calls as home health can only come out 1-2 times a week with her insurance. CSW discussed insurance authorization process and provide SNF bed offers just in case patient is unable to arrange care.   Skilled Nursing Rehab Facilities-   [email or web address]   Ratings out of 5 possible   Name Address  Phone # Quality Care Staffing Health Inspection Overall  [facility] [address] [place] [phone] 4 5 2 3   [facility]  [address] [place], [place] [phone] 3 2 5 5   [facility] [address] [place], [address] [place] 3 1 1 1   [facility] [address] [place] 3 2 4 4   [facility] [address], [phone] 1 1 2 1   [facility] [phone] N[address], [phone] 2 1 4 3   [facility] [name]  [address] [place] 5 2 3 4   [facility] [address] [facility] [place] 5 2 2 3   [address] ([place]) [address], [facility] 5 1 2 2   [facility] [phone], [phone] 4 1 2 1   [facility] [address] [facility] [phone] 4 1 2 1   [facility]) [address], [place]in[place] Otto [phone] 4 1 1 1   [address] [facility] 3 2 4 4           [facility] [address], [facility] [place]      [facility] [address], [phone] 4 2 3 3   [facility] [place] [address], [address] [phone] 4 1 5 4   [address], [place] [facility] [address], [place] [phone] 2 1 1 1   [facility] [address], [place] [phone] 2 1 3 2           [address] (no [facility]) [address], [place] [phone] 4 5 5 5   [facility] (No [employer]) [address], [name] 3 1 4 3   [facility] (No [employer]) [address], [place] [phone] 5 5 5 5   [facility] [address] [employer] [phone] 3 2 4 4   [facility] [address] [facility] 1 1 2 1   [facility] [address] [phone] 2 1 1  1  [facility] [address] [place], [place] [phone] 5 2 4 5   [facility] [address], [address] [place] 3 1 1 1   [facility] [address] [place], [place] 2 1 2 1           [facility] [address], [place] [phone] 1 1 1 1   [facility] [address]  [phone] 2 4 2 2   [facility] [address], [address] [phone] 5 2 3 4   [facility] [place] [address] [phone], [place] [phone] 2 1 1 1   [facility] (No Humana) [address], Rosalita Levan [phone] 2 1 3 2   [facility] [address], [phone] 3 1 1 1           [facility] [address] [place], [facility] 5 4 5 5   [facility])  [address], [facility] 2 2 3 3   [facility]) [address] [place] [phone] 3 2 4 4   [facility] [address], [facility] [address] 4 3 4 4   [address] [place], [facility] [place] 3 3 1 1   [facility]) [address] [place] [phone] 2 2 4 4      Expected Discharge Plan: Home w Home Health Services Barriers to Discharge: Continued Medical Work up   Patient Goals and CMS Choice Patient states their goals for this hospitalization and ongoing recovery are:: Return home to her bed and dog CMS Medicare.gov Compare Post Acute Care list provided to:: Patient Choice offered to / list presented to : Patient  Expected Discharge Plan and Services Expected Discharge Plan: Home w Home Health Services In-house Referral: Clinical Social Work Discharge Planning Services: CM Consult Post Acute Care Choice: Home Health Living arrangements for the past 2 months: Single Family Home                                      Prior Living Arrangements/Services Living arrangements for the past 2 months: Single Family Home Lives with:: Self, Pets Patient language and need for interpreter reviewed:: Yes Do you feel safe going back to the place where you live?: Yes      Need for Family Participation in Patient Care: Yes (Comment) Care giver support system in place?: No (comment)   Criminal Activity/Legal Involvement Pertinent to Current Situation/Hospitalization: No - Comment as needed  Activities of Daily Living Home Assistive Devices/Equipment: Walker (specify type), Cane (specify quad or straight) ADL Screening (condition at time of admission) Patient's cognitive ability adequate to safely complete daily activities?: Yes Is the patient deaf or have difficulty hearing?: No Does the patient have  difficulty seeing, even when wearing glasses/contacts?: No Does the patient have difficulty concentrating, remembering, or making decisions?: No Patient able to express need for assistance with ADLs?: Yes Does the patient have difficulty dressing or bathing?: Yes Independently performs ADLs?: Yes (appropriate for developmental age) Does the patient have difficulty walking or climbing stairs?: Yes Weakness of Legs: Both Weakness of Arms/Hands: Both  Permission Sought/Granted Permission sought to share information with : Facility [address] granted to share information with : Yes, Verbal Permission Granted              Emotional Assessment Appearance:: Appears stated age Attitude/Demeanor/Rapport: Engaged Affect (typically observed): Accepting, Appropriate Orientation: : Oriented to Self,  Oriented to Place, Oriented to  Time, Oriented to Situation Alcohol / Substance Use: Not Applicable Psych Involvement: No (comment)  Admission diagnosis:  Spondylolisthesis of lumbar region [M43.16] Patient Active Problem List   Diagnosis Date Noted   HNP (herniated nucleus pulposus), cervical 12/08/2020   Lumbago with sciatica, unspecified side 07/28/2020   PICC (peripherally inserted central catheter) in place 03/16/2020   Encounter for long-term (current) use of antibiotics 03/16/2020   Polypharmacy 03/09/2020   At risk for adverse drug event 03/09/2020   MSSA bacteremia 02/29/2020   Postoperative infection 02/28/2020   Abnormal gait due to muscle weakness 02/26/2020   HNP (herniated nucleus pulposus), lumbar 02/16/2020   Muscle weakness of lower extremity 02/14/2020   Spondylolisthesis of lumbar region 10/30/2017   Painful orthopaedic hardware right ankle 05/27/2016   Closed fracture of distal end of right fibula and tibia    Ankle syndesmosis disruption    ASCUS favor benign 01/06/2014   Leg swelling 12/22/2013   Seasonal allergies    Anemia    Chronic back  pain due to DJD with history surgery    Hyperthyroidism    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 [name] [address] Phone: [phone] Fax: [phone]  [facility] [name] [address] [name] [address] Phone: [phone] Fax: [phone]     Social Determinants of Health (SDOH) Interventions    Readmission Risk Interventions     No data to display

## 2022-03-28 NOTE — Progress Notes (Signed)
Occupational Therapy Treatment Patient Details Name: [name] MRN: [id number] DOB: 12/03/1960 Today's Date: 03/28/2022   History of present illness [name] is a 61 y.o. female who underwent thoracic ten to Lumbar three Posterior lateral arthrodesis with screws 03/22/22. PMH: anemia, anxiety, fibromyalgia, GERD, HTN, hyperthyroidism, IBS, ACDF 3/4   OT comments  Patient with fair progress toward patient focused goals.  Patient is wanting to return home, but would benefit from intensive post acute rehab to maximize her functional status prior to returning home.  She is needing increased time and effort to complete basic mobility and ADL tasks.  She lives alone at baseline, and has the potential to participate with 3+ hours of rehab, and return home at a Mod I level.  Currently she would need 24 hour care at home, and is needing up to Mod A for ADL completion at a sit/stand level and basic mobility, including cues for back precautions.     Recommendations for follow up therapy are one component of a multi-disciplinary discharge planning process, led by the attending physician.  Recommendations may be updated based on patient status, additional functional criteria and insurance authorization.    Follow Up Recommendations  Acute inpatient rehab (3hours/day)    Assistance Recommended at Discharge Frequent or constant Supervision/Assistance  Patient can return home with the following  A little help with walking and/or transfers;A little help with bathing/dressing/bathroom;Assistance with cooking/housework;Help with stairs or ramp for entrance;Assist for transportation   Equipment Recommendations       Recommendations for Other Services      Precautions / Restrictions Precautions Precautions: Back;Fall Precaution Booklet Issued: Yes (comment) Precaution Comments: Verbally reviewed, needs reminders for compliance Restrictions Weight Bearing Restrictions: No Other  Position/Activity Restrictions: no brace needed       Mobility Bed Mobility Overal bed mobility: Needs Assistance Bed Mobility: Sidelying to Sit   Sidelying to sit: Min assist, Mod assist, HOB elevated            Transfers Overall transfer level: Needs assistance Equipment used: Rolling walker (2 wheels) Transfers: Sit to/from Stand Sit to Stand: Min assist, From elevated surface                 Balance Overall balance assessment: Needs assistance Sitting-balance support: Feet supported Sitting balance-[name]: Good     Standing balance support: Reliant on assistive device for balance Standing balance-[name]: Fair                             ADL either performed or assessed with clinical judgement   ADL Overall ADL's : Needs assistance/impaired Eating/Feeding: Independent;Sitting   Grooming: Standing;Set up           Upper Body Dressing : Sitting;Min guard   Lower Body Dressing: Maximal assistance;Cueing for compensatory techniques;Sit to/from stand Lower Body Dressing Details (indicate cue type and reason): will look to introduce hip kit. Toilet Transfer: Minimal assistance;Ambulation;Rolling walker (2 wheels);Regular Glass blower/designer Details (indicate cue type and reason): cues not to pull from the RW                Extremity/Trunk Assessment              Vision Patient Visual Report: No change from baseline     Perception Perception Perception: Not tested   Praxis Praxis Praxis: Not tested    Cognition Arousal/Alertness: Awake/alert Behavior During Therapy: [facility] for tasks assessed/performed Overall Cognitive  Status: Impaired/Different from baseline                     Current Attention Level: Selective Memory: Decreased recall of precautions       Problem Solving: Requires verbal cues                             Pertinent Vitals/ Pain       Pain Assessment Pain Assessment:  Faces Faces Pain Scale: Hurts little more Pain Location: back Pain Descriptors / Indicators: Discomfort, Tightness, Grimacing, Operative site guarding Pain Intervention(s): Monitored during session                                                          Frequency  Min 2X/week        Progress Toward Goals  OT Goals(current goals can now be found in the care plan section)  Progress towards OT goals: Progressing toward goals  Acute Rehab OT Goals Patient Stated Goal: Go home OT Goal Formulation: With patient Time For Goal Achievement: 04/09/22 Potential to Achieve Goals: Good  Plan Discharge plan needs to be updated    Co-evaluation                 AM-PAC OT "6 Clicks" Daily Activity     Outcome Measure   Help from another person eating meals?: None Help from another person taking care of personal grooming?: A Little Help from another person toileting, which includes using toliet, bedpan, or urinal?: A Little Help from another person bathing (including washing, rinsing, drying)?: A Lot Help from another person to put on and taking off regular upper body clothing?: A Little Help from another person to put on and taking off regular lower body clothing?: A Lot 6 Click Score: 17    End of Session Equipment Utilized During Treatment: Gait belt;Rolling walker (2 wheels)  OT Visit Diagnosis: Unsteadiness on feet (R26.81);Muscle weakness (generalized) (M62.81);Pain   Activity Tolerance Patient tolerated treatment well   Patient Left in chair;with call bell/phone within reach;with chair alarm set   Nurse Communication Mobility status        Time: 1594-7076 OT Time Calculation (min): 17 min  Charges: OT General Charges $OT Visit: 1 Visit OT Treatments $Self Care/Home Management : 8-22 mins  03/28/2022  RP, OTR/L  Acute Rehabilitation Services  Office:  [phone]   Metta Clines 03/28/2022, 2:48 PM

## 2022-03-28 NOTE — Progress Notes (Signed)
Patient ID: [name], female   DOB: June 17, 1961, 61 y.o.   MRN: [id number] BP 133/80   Pulse 83   Temp 98.6 F (37 C) (Oral)   Resp 20   Ht 5\' 3"  (1.6 m)   Wt 93.9 kg   LMP 11/20/2012   SpO2 97%   BMI 36.67 kg/m  Alert and oriented x 4, speech is clear and fluent Moving all extremities Would like to go home and not a facility. She absolutely does not want a nursing home.

## 2022-03-28 NOTE — Progress Notes (Signed)
Physical Therapy Treatment Patient Details Name: [name] MRN: [id number] DOB: [birth year] Today's Date: 03/28/2022   History of Present Illness [name] is a 61 y.o. female who underwent thoracic ten to Lumbar three Posterior lateral arthrodesis with screws 03/22/22. PMH: anemia, anxiety, fibromyalgia, GERD, HTN, hyperthyroidism, IBS, ACDF 3/4    PT Comments    Pt was more appropriate in her conversation today, but had bouts of tangential speech, poor attention, and memory deficits. Pt needs cues to recall and comply to her spinal precautions. She did progress to being able to roll and transition sidelying to sit EOB at a min guard assist level, but required several minutes to do so due to weakness, poor sequencing, and poor attention span. Focused remainder of session on transfer training, intermittently progressing to min guard assist level, but also intermittently requiring up to modA without being successful due to knee instability and calf muscle spasms. Pt was denied by [employer], thus updated recs to SNF unless pt can arrange more assistance at home to provide the level of care she currently needs. Will continue to follow acutely.     Recommendations for follow up therapy are one component of a multi-disciplinary discharge planning process, led by the attending physician.  Recommendations may be updated based on patient status, additional functional criteria and insurance authorization.  Follow Up Recommendations  Skilled nursing-short term rehab (<3 hours/day) (denied by AIR) Can patient physically be transported by private vehicle: Yes   Assistance Recommended at Discharge Frequent or constant Supervision/Assistance  Patient can return home with the following A little help with bathing/dressing/bathroom;Help with stairs or ramp for entrance;Assist for transportation;A lot of help with walking and/or transfers;Assistance with cooking/housework   Equipment Recommendations  None  recommended by PT    Recommendations for Other Services       Precautions / Restrictions Precautions Precautions: Back;Fall Precaution Booklet Issued: Yes (comment) Precaution Comments: Verbally reviewed, needs reminders for compliance Required Braces or Orthoses:  (no brace needed) Restrictions Weight Bearing Restrictions: No Other Position/Activity Restrictions: no brace needed     Mobility  Bed Mobility Overal bed mobility: Needs Assistance Bed Mobility: Rolling, Sidelying to Sit, Sit to Sidelying Rolling: Min guard Sidelying to sit: Min guard     Sit to sidelying: Mod assist General bed mobility comments: Bed flat but pt using R bed rail for all bed mobility. Cues to log roll and transition legs off bed and push up on bed to ascend trunk, several min required and min guard assist for safety to complete. ModA to manage legs back onto bed with return to lying in bed, cuing pt to lean laterally onto elbow to descend trunk.    Transfers Overall transfer level: Needs assistance Equipment used: Rolling walker (2 wheels) Transfers: Sit to/from Stand Sit to Stand: Min assist, Mod assist, Min guard           General transfer comment: Cues provided to push up with at least one hand off bed surface to stand, but pt unable to achieve a full stand without knees buckling and pt returning to sit on bed in this manner, x2 attempts. Pt then cued to place 1 hand on anterior aspect of RW and one on side grab handle of RW, min guard assist to stand the first rep, minA the next 2 reps. ModA but unsuccessful with standing x2 attempts for a fourth rep.    Ambulation/Gait  General Gait Details: deferred to focus on transfers and due to calf pain/spasms   Stairs             Wheelchair Mobility    Modified Rankin (Stroke Patients Only)       Balance Overall balance assessment: Needs assistance Sitting-balance support: Feet supported, No upper extremity  supported Sitting balance-[name]: Good     Standing balance support: During functional activity, Bilateral upper extremity supported Standing balance-[name]: Poor Standing balance comment: Reliant on RW                            Cognition Arousal/Alertness: Awake/alert, Lethargic Behavior During Therapy: WFL for tasks assessed/performed Overall Cognitive Status: Impaired/Different from baseline Area of Impairment: Attention, Memory, Problem solving                   Current Attention Level: Selective Memory: Decreased recall of precautions, Decreased short-term memory       Problem Solving: Slow processing, Requires verbal cues, Difficulty sequencing General Comments: Pt more appropriate with conversation today, but tangential at times, needing redirecting to stay on track. Pt needing mod cues to recall her spinal precautions and reminders to remain compliant. Pt believing specks on bed and gown were bugs, but in reality were her skin flakes, but pt very focused on them throughout session.        Exercises      General Comments        Pertinent Vitals/Pain Pain Assessment Pain Assessment: Faces Faces Pain Scale: Hurts even more Pain Location: back; bil calves Pain Descriptors / Indicators: Discomfort, Grimacing, Operative site guarding, Spasm Pain Intervention(s): Limited activity within patient's tolerance, Monitored during session, Repositioned    Home Living                          Prior Function            PT Goals (current goals can now be found in the care plan section) Acute Rehab PT Goals Patient Stated Goal: to go home PT Goal Formulation: With patient Time For Goal Achievement: 04/09/22 Potential to Achieve Goals: Good Progress towards PT goals: Progressing toward goals    Frequency    Min 5X/week      PT Plan Discharge plan needs to be updated    Co-evaluation              AM-PAC PT "6  Clicks" Mobility   Outcome Measure  Help needed turning from your back to your side while in a flat bed without using bedrails?: A Little Help needed moving from lying on your back to sitting on the side of a flat bed without using bedrails?: A Little Help needed moving to and from a bed to a chair (including a wheelchair)?: A Little Help needed standing up from a chair using your arms (e.g., wheelchair or bedside chair)?: A Lot Help needed to walk in hospital room?: A Little Help needed climbing 3-5 steps with a railing? : Total 6 Click Score: 15    End of Session Equipment Utilized During Treatment: Gait belt Activity Tolerance: Patient limited by pain Patient left: with call bell/phone within reach;in bed Nurse Communication: Mobility status PT Visit Diagnosis: Difficulty in walking, not elsewhere classified (R26.2);Muscle weakness (generalized) (M62.81);Unsteadiness on feet (R26.81);Other abnormalities of gait and mobility (R26.89)     Time: 2426-8341 PT Time Calculation (min) (ACUTE ONLY): 41  min  Charges:  $Therapeutic Activity: 38-52 mins                     Raymond Gurney, PT, DPT Acute Rehabilitation Services  Office: [phone]    Jewel Baize 03/28/2022, 5:15 PM

## 2022-03-29 LAB — BASIC METABOLIC PANEL
Anion gap: 15 (ref 5–15)
BUN: 21 mg/dL — ABNORMAL HIGH (ref 6–20)
CO2: 28 mmol/L (ref 22–32)
Calcium: 9.4 mg/dL (ref 8.9–10.3)
Chloride: 95 mmol/L — ABNORMAL LOW (ref 98–111)
Creatinine, Ser: 0.71 mg/dL (ref 0.44–1.00)
GFR, Estimated: >60 mL/min (ref >60)
Glucose, Bld: 125 mg/dL — ABNORMAL HIGH (ref 70–99)
Potassium: 4.5 mmol/L (ref 3.5–5.1)
Sodium: 138 mmol/L (ref 135–145)

## 2022-03-29 LAB — MAGNESIUM: Magnesium: 2.4 mg/dL (ref 1.7–2.4)

## 2022-03-29 NOTE — Progress Notes (Signed)
Patient ID: [name], female   DOB: 1961/06/24, 61 y.o.   MRN: [id number] BP 95/80   Pulse 71   Temp 98.7 F (37.1 C) (Oral)   Resp 14   Ht 5\' 3"  (1.6 m)   Wt 93.9 kg   LMP 11/20/2012   SpO2 100%   BMI 36.67 kg/m  Alert and oriented.  Wound with serous drainage. Will take back over the weekend and revise the wound.  Moving much better  Overall doing well . No signs of wound infection, afebrile

## 2022-03-30 ENCOUNTER — Inpatient Hospital Stay (HOSPITAL_COMMUNITY): Payer: Medicare Other | Admitting: Anesthesiology

## 2022-03-30 ENCOUNTER — Encounter (HOSPITAL_COMMUNITY): Payer: Self-pay | Admitting: Neurosurgery

## 2022-03-30 ENCOUNTER — Other Ambulatory Visit: Payer: Self-pay

## 2022-03-30 ENCOUNTER — Encounter (HOSPITAL_COMMUNITY)
Admission: RE | Disposition: A | Discharge status: Home Health | Payer: Self-pay | Source: Home / Self Care | Attending: Neurosurgery

## 2022-03-30 DIAGNOSIS — F418 Other specified anxiety disorders: Secondary | ICD-10-CM | POA: Diagnosis not present

## 2022-03-30 DIAGNOSIS — J449 Chronic obstructive pulmonary disease, unspecified: Secondary | ICD-10-CM | POA: Diagnosis not present

## 2022-03-30 DIAGNOSIS — T8189XA Other complications of procedures, not elsewhere classified, initial encounter: Secondary | ICD-10-CM | POA: Diagnosis not present

## 2022-03-30 DIAGNOSIS — M4150 Other secondary scoliosis, site unspecified: Secondary | ICD-10-CM | POA: Diagnosis present

## 2022-03-30 DIAGNOSIS — I10 Essential (primary) hypertension: Secondary | ICD-10-CM

## 2022-03-30 HISTORY — PX: WOUND EXPLORATION: SHX6188

## 2022-03-30 SURGERY — WOUND EXPLORATION
Anesthesia: General | Site: Spine Thoracic

## 2022-03-30 MED ORDER — BUPIVACAINE HCL (PF) 0.5 % IJ SOLN
INTRAMUSCULAR | Status: AC
Start: 2022-03-30 — End: ?
  Filled 2022-03-30: qty 30

## 2022-03-30 MED ORDER — EPHEDRINE SULFATE-NACL 50-0.9 MG/10ML-% IV SOSY
PREFILLED_SYRINGE | INTRAVENOUS | Status: DC | PRN
  Administered 2022-03-30 (×2): 10 mg via INTRAVENOUS
  Administered 2022-03-30: 5 mg via INTRAVENOUS

## 2022-03-30 MED ORDER — ADHERUS DURAL SEALANT
PACK | TOPICAL | Status: DC | PRN
  Administered 2022-03-30 (×3): 1 via TOPICAL

## 2022-03-30 MED ORDER — SUCCINYLCHOLINE CHLORIDE 200 MG/10ML IV SOSY
PREFILLED_SYRINGE | INTRAVENOUS | Status: DC | PRN
  Administered 2022-03-30: 100 mg via INTRAVENOUS

## 2022-03-30 MED ORDER — 0.9 % SODIUM CHLORIDE (POUR BTL) OPTIME
TOPICAL | Status: DC | PRN
  Administered 2022-03-30: 1000 mL

## 2022-03-30 MED ORDER — ONDANSETRON HCL 4 MG/2ML IJ SOLN
INTRAMUSCULAR | Status: DC | PRN
  Administered 2022-03-30: 4 mg via INTRAVENOUS

## 2022-03-30 MED ORDER — FENTANYL CITRATE (PF) 250 MCG/5ML IJ SOLN
INTRAMUSCULAR | Status: AC
  Filled 2022-03-30: qty 5

## 2022-03-30 MED ORDER — LACTATED RINGERS IV SOLN: INTRAVENOUS | Status: DC

## 2022-03-30 MED ORDER — ALBUMIN HUMAN 5 % IV SOLN: INTRAVENOUS | Status: DC | PRN

## 2022-03-30 MED ORDER — ONDANSETRON HCL 4 MG/2ML IJ SOLN: 4.0000 mg | Freq: Once | INTRAMUSCULAR | Status: DC | PRN

## 2022-03-30 MED ORDER — LIDOCAINE 2% (20 MG/ML) 5 ML SYRINGE
INTRAMUSCULAR | Status: DC | PRN
  Administered 2022-03-30: 100 mg via INTRAVENOUS

## 2022-03-30 MED ORDER — PHENYLEPHRINE 80 MCG/ML (10ML) SYRINGE FOR IV PUSH (FOR BLOOD PRESSURE SUPPORT)
PREFILLED_SYRINGE | INTRAVENOUS | Status: DC | PRN
  Administered 2022-03-30 (×5): 160 ug via INTRAVENOUS

## 2022-03-30 MED ORDER — MIDAZOLAM HCL 2 MG/2ML IJ SOLN
INTRAMUSCULAR | Status: AC
  Filled 2022-03-30: qty 2

## 2022-03-30 MED ORDER — FENTANYL CITRATE (PF) 100 MCG/2ML IJ SOLN: 25.0000 ug | INTRAMUSCULAR | Status: DC | PRN

## 2022-03-30 MED ORDER — CHLORHEXIDINE GLUCONATE 0.12 % MT SOLN
15.0000 mL | Freq: Once | OROMUCOSAL | Status: AC
  Administered 2022-03-30: 15 mL via OROMUCOSAL
  Filled 2022-03-30: qty 15

## 2022-03-30 MED ORDER — ACETAMINOPHEN 500 MG PO TABS
1000.0000 mg | ORAL_TABLET | Freq: Once | ORAL | Status: AC
  Administered 2022-03-30: 1000 mg via ORAL
  Filled 2022-03-30: qty 2

## 2022-03-30 MED ORDER — FENTANYL CITRATE (PF) 250 MCG/5ML IJ SOLN
INTRAMUSCULAR | Status: DC | PRN
Start: 2022-03-30 — End: 2022-03-30
  Administered 2022-03-30: 50 ug via INTRAVENOUS

## 2022-03-30 MED ORDER — PROPOFOL 10 MG/ML IV BOLUS
INTRAVENOUS | Status: AC
  Filled 2022-03-30: qty 20

## 2022-03-30 MED ORDER — BUPIVACAINE HCL 0.5 % IJ SOLN
INTRAMUSCULAR | Status: DC | PRN
  Administered 2022-03-30: 30 mL

## 2022-03-30 MED ORDER — BACITRACIN ZINC 500 UNIT/GM EX OINT
TOPICAL_OINTMENT | CUTANEOUS | Status: DC | PRN
  Administered 2022-03-30: 1 via TOPICAL

## 2022-03-30 MED ORDER — DURASEAL SPINE SEALANT OPTIME
PACK | TOPICAL | Status: DC | PRN
  Administered 2022-03-30: 1 via TOPICAL

## 2022-03-30 MED ORDER — PROPOFOL 10 MG/ML IV BOLUS
INTRAVENOUS | Status: DC | PRN
  Administered 2022-03-30: 150 mg via INTRAVENOUS

## 2022-03-30 SURGICAL SUPPLY — 35 items
APL SRG 60D 8 XTD TIP BNDBL (TIP) ×1
BAG COUNTER SPONGE SURGICOUNT (BAG) ×4 IMPLANT
BAG SPNG CNTER NS LX DISP (BAG) ×2
CANISTER SUCT 3000ML PPV (MISCELLANEOUS) ×3 IMPLANT
CARTRIDGE OIL MAESTRO DRILL (MISCELLANEOUS) ×2 IMPLANT
DIFFUSER DRILL AIR PNEUMATIC (MISCELLANEOUS) ×3 IMPLANT
DRAPE LAPAROTOMY 100X72X124 (DRAPES) ×1 IMPLANT
DRSG OPSITE POSTOP 4X10 (GAUZE/BANDAGES/DRESSINGS) ×1 IMPLANT
DURASEAL APPLICATOR TIP (TIP) ×1 IMPLANT
DURASEAL SPINE SEALANT 5 POLY (MISCELLANEOUS) ×1 IMPLANT
ELECT REM PT RETURN 9FT ADLT (ELECTROSURGICAL) ×2
ELECTRODE REM PT RTRN 9FT ADLT (ELECTROSURGICAL) ×2 IMPLANT
GLOVE BIOGEL PI IND STRL 6.5 (GLOVE) IMPLANT
GLOVE BIOGEL PI INDICATOR 6.5 (GLOVE) ×3
GLOVE EXAM NITRILE XL STR (GLOVE) IMPLANT
GLOVE SURG SS PI 6.5 STRL IVOR (GLOVE) ×2 IMPLANT
GOWN STRL REUS W/ TWL LRG LVL3 (GOWN DISPOSABLE) IMPLANT
GOWN STRL REUS W/ TWL XL LVL3 (GOWN DISPOSABLE) IMPLANT
GOWN STRL REUS W/TWL LRG LVL3 (GOWN DISPOSABLE) ×6
GOWN STRL REUS W/TWL XL LVL3 (GOWN DISPOSABLE)
KIT BASIN OR (CUSTOM PROCEDURE TRAY) ×3 IMPLANT
KIT TURNOVER KIT B (KITS) ×3 IMPLANT
NEEDLE HYPO 22GX1.5 SAFETY (NEEDLE) IMPLANT
NS IRRIG 1000ML POUR BTL (IV SOLUTION) ×3 IMPLANT
OIL CARTRIDGE MAESTRO DRILL (MISCELLANEOUS) ×2
PACK LAMINECTOMY NEURO (CUSTOM PROCEDURE TRAY) ×3 IMPLANT
PAD ARMBOARD 7.5X6 YLW CONV (MISCELLANEOUS) ×9 IMPLANT
SEALANT ADHERUS EXTEND TIP (MISCELLANEOUS) ×3 IMPLANT
STRIP CLOSURE SKIN 1/2X4 (GAUZE/BANDAGES/DRESSINGS) ×3 IMPLANT
SUT VIC AB 1 CT1 18XBRD ANBCTR (SUTURE) ×2 IMPLANT
SUT VIC AB 1 CT1 8-18 (SUTURE) ×2
SUT VIC AB 2-0 CP2 18 (SUTURE) ×3 IMPLANT
TOWEL GREEN STERILE (TOWEL DISPOSABLE) ×3 IMPLANT
TOWEL GREEN STERILE FF (TOWEL DISPOSABLE) ×3 IMPLANT
WATER STERILE IRR 1000ML POUR (IV SOLUTION) ×3 IMPLANT

## 2022-03-30 NOTE — Anesthesia Procedure Notes (Signed)
Procedure Name: Intubation Date/Time: 03/30/2022 5:41 PM  Performed by: Laruth Bouchard., CRNAPre-anesthesia Checklist: Patient identified, Emergency Drugs available, Suction available, Patient being monitored and Timeout performed Patient Re-evaluated:Patient Re-evaluated prior to induction Oxygen Delivery Method: Circle system utilized Preoxygenation: Pre-oxygenation with 100% oxygen Induction Type: IV induction Laryngoscope Size: Mac and 3 Grade View: Grade I Tube type: Oral Tube size: 7.0 mm Number of attempts: 1 Airway Equipment and Method: Stylet Placement Confirmation: ETT inserted through vocal cords under direct vision, positive ETCO2 and breath sounds checked- equal and bilateral Secured at: 23 cm Tube secured with: Tape Dental Injury: Teeth and Oropharynx as per pre-operative assessment

## 2022-03-30 NOTE — Anesthesia Preprocedure Evaluation (Addendum)
Anesthesia Evaluation  Patient identified by MRN, date of birth, ID band Patient awake    Reviewed: Allergy & Precautions, NPO status , Patient's Chart, lab work & pertinent test results, reviewed documented beta blocker date and time   Airway Mallampati: II  TM Distance: >3 FB Neck ROM: Full    Dental  (+) Dental Advisory Given, Edentulous Lower, Edentulous Upper   Pulmonary asthma , COPD,  COPD inhaler, former smoker,    Pulmonary exam normal breath sounds clear to auscultation       Cardiovascular hypertension, Pt. on home beta blockers and Pt. on medications Normal cardiovascular exam Rhythm:Regular Rate:Normal     Neuro/Psych  Headaches, PSYCHIATRIC DISORDERS Anxiety Depression THORACIC WOUND DRAINAGE  Neuromuscular disease    GI/Hepatic Neg liver ROS, hiatal hernia, GERD  Medicated,  Endo/Other  negative endocrine ROSObesity   Renal/GU negative Renal ROS     Musculoskeletal  (+) Arthritis , Osteoarthritis,  Fibromyalgia -, narcotic dependent  Abdominal   Peds  Hematology  (+) Blood dyscrasia, anemia ,   Anesthesia Other Findings   Reproductive/Obstetrics                            Anesthesia Physical Anesthesia Plan  ASA: 3  Anesthesia Plan: General   Post-op Pain Management: Tylenol PO (pre-op)*, Celebrex PO (pre-op)* and Gabapentin PO (pre-op)*   Induction: Intravenous  PONV Risk Score and Plan: 3 and Dexamethasone, Ondansetron and Midazolam  Airway Management Planned: Oral ETT  Additional Equipment:   Intra-op Plan:   Post-operative Plan: Extubation in OR  Informed Consent: I have reviewed the patients History and Physical, chart, labs and discussed the procedure including the risks, benefits and alternatives for the proposed anesthesia with the patient or authorized representative who has indicated his/her understanding and acceptance.     Dental advisory  given  Plan Discussed with: CRNA  Anesthesia Plan Comments:        Anesthesia Quick Evaluation

## 2022-03-31 ENCOUNTER — Encounter (HOSPITAL_COMMUNITY): Payer: Self-pay | Admitting: Neurosurgery

## 2022-04-01 NOTE — TOC Progression Note (Addendum)
Transition of Care [facility]) - Progression Note    Patient Details  Name: [name] MRN: [id number] Date of Birth: [birth year]  [facility] CM/SW Contact  Eduard Roux, [name] Phone Number: 04/01/2022, 1:51 PM  Clinical Narrative:     CSW met with patient at bedside. CSW introduced self and explained role. Patient states she will d/c home once medically stable for d/c. Patient states " I got folks" to assist her at home. She also reports "if my doctor thinks I should go ,then I would go. Patient shared, her last stay at SNF was not a good experience. She is hopeful she will get strong enough to return home.   TOC will continue to follow and assist with discharge planning.   Expected Discharge Plan: Home w Home Health Services Barriers to Discharge: Continued Medical Work up  Expected Discharge Plan and Services Expected Discharge Plan: Home w Home Health Services In-house Referral: Clinical Social Work Discharge Planning Services: CM Consult Post Acute Care Choice: Home Health Living arrangements for the past 2 months: Single Family Home                                       Social Determinants of Health (SDOH) Interventions    Readmission Risk Interventions     No data to display

## 2022-04-02 NOTE — Care Management Important Message (Signed)
Important Message  Patient Details  Name: [name] MRN: [id number] Date of Birth: 15-Mar-1961   Medicare Important Message Given:  Yes     [name] Stefan Church 04/02/2022, 3:29 PM

## 2022-04-03 NOTE — Progress Notes (Signed)
Patient ID: [name], female   DOB: 08-01-1961, 61 y.o.   MRN: [id number] BP (!) 89/71 (BP Location: Left Arm)   Pulse 76   Temp 99.2 F (37.3 C) (Axillary)   Resp 15   Ht 5\' 3"  (1.6 m)   Wt 93.9 kg   LMP 11/20/2012   SpO2 100%   BMI 36.67 kg/m  Alert and oriented x4 Speech is clear and fluent Moving much better Wound is clean,no signs of infection Continue pt, ot. Has no desire for a snf

## 2022-04-03 NOTE — Evaluation (Signed)
Occupational Therapy Re-Evaluation Patient Details Name: [name] MRN: [id number] DOB: 26-Mar-1961 Today's Date: 04/03/2022   History of Present Illness [name] who underwent thoracic ten to Lumbar three Posterior lateral arthrodesis with screws 6/16; and revision sx of thoracic wound / CSF leak 6/24. PMH: anemia, anxiety, fibromyalgia, GERD, HTN, hyperthyroidism, IBS, ACDF 3/4   Clinical Impression   [name] was re-evaluated s/p the above revision surgery. Pt does required increased assist now for transfers, mobility with RW and ADLs needing up to max A. Pt is limited by pain, poor activity tolerance, general weakness, and poor insight into deficits and safety. Pt required significantly increased time for all tasks this date and maximal cues for attention as pt is tangential. Extensive time taken to discuss safe d/c plan as AIR has declined; currently recommending SNF for safety as pt does not have great support at home as she lives alone.      Recommendations for follow up therapy are one component of a multi-disciplinary discharge planning process, led by the attending physician.  Recommendations may be updated based on patient status, additional functional criteria and insurance authorization.   Follow Up Recommendations  Skilled nursing-short term rehab (<3 hours/day) (pt currently declining SNF despite education and insight into deficits, per chart family has stated pt does not have great home support. If pt d/c's home, she will need HHOT and max HH services.)    Assistance Recommended at Discharge Frequent or constant Supervision/Assistance  Patient can return home with the following A lot of help with walking and/or transfers;A lot of help with bathing/dressing/bathroom;Assistance with cooking/housework;Assist for transportation;Help with stairs or ramp for entrance    Functional Status Assessment  Patient has had a recent decline in their functional status and  demonstrates the ability to make significant improvements in function in a reasonable and predictable amount of time.  Equipment Recommendations  None recommended by OT    Recommendations for Other Services       Precautions / Restrictions Precautions Precautions: Back;Fall Precaution Booklet Issued: Yes (comment) Precaution Comments: Verbally reviewed, needs reminders for compliance Restrictions Weight Bearing Restrictions: No Other Position/Activity Restrictions: no brace needed      Mobility Bed Mobility Overal bed mobility: Needs Assistance Bed Mobility: Rolling, Sidelying to Sit Rolling: Min assist Sidelying to sit: Mod assist       General bed mobility comments: significantly increased time required    Transfers Overall transfer level: Needs assistance Equipment used: Rolling walker (2 wheels) Transfers: Sit to/from Stand, Bed to chair/wheelchair/BSC Sit to Stand: Min assist, From elevated surface     Step pivot transfers: Min guard     General transfer comment: pt only tolerating pivotal steps this session - unable to stand with bed in lowest position. significantly increased time requried      Balance Overall balance assessment: Needs assistance Sitting-balance support: Feet supported, No upper extremity supported Sitting balance-[name]: Good     Standing balance support: During functional activity, Bilateral upper extremity supported Standing balance-[name]: Poor                             ADL either performed or assessed with clinical judgement   ADL Overall ADL's : Needs assistance/impaired Eating/Feeding: Independent;Sitting   Grooming: Set up;Sitting   Upper Body Bathing: Moderate assistance;Sitting   Lower Body Bathing: Maximal assistance;Sit to/from stand   Upper Body Dressing : Minimal assistance;Sitting   Lower Body Dressing: Maximal assistance;Sit  to/from Database administrator: Minimal  assistance;BSC/3in1;Rolling walker (2 wheels) Toilet Transfer Details (indicate cue type and reason): incontinent upon standing Toileting- Clothing Manipulation and Hygiene: Maximal assistance;Sit to/from stand       Functional mobility during ADLs: Minimal assistance;Rolling walker (2 wheels) General ADL Comments: significantly increased time required for all tasks. Pt tangential and limited by pain. poor activity tolerance and requires cues for back precautions     Vision Baseline Vision/History: 0 No visual deficits Patient Visual Report: No change from baseline Vision Assessment?: No apparent visual deficits     Perception     Praxis      Pertinent Vitals/Pain Pain Assessment Pain Assessment: Faces Faces Pain Scale: Hurts even more Pain Location: back, BLEs, head Pain Descriptors / Indicators: Discomfort, Grimacing, Operative site guarding Pain Intervention(s): Limited activity within patient's tolerance, Monitored during session, Premedicated before session     Hand Dominance Right   Extremity/Trunk Assessment Upper Extremity Assessment Upper Extremity Assessment: RUE deficits/detail;LUE deficits/detail RUE Deficits / Details: hx of neuropathy, ROM and MMT overall WFL. coordination and dexterity are mildly limited RUE Sensation: decreased light touch;history of peripheral neuropathy RUE Coordination: decreased fine motor LUE Deficits / Details: hx of neuropathy, ROM and MMT overall WFL. coordination and dexterity are mildly limited LUE Sensation: decreased light touch;history of peripheral neuropathy LUE Coordination: decreased fine motor   Lower Extremity Assessment Lower Extremity Assessment: Defer to PT evaluation   Cervical / Trunk Assessment Cervical / Trunk Assessment: Back Surgery   Communication Communication Communication: No difficulties   Cognition Arousal/Alertness: Awake/alert Behavior During Therapy: WFL for tasks assessed/performed Overall  Cognitive Status: Impaired/Different from baseline Area of Impairment: Memory, Attention                   Current Attention Level: Alternating Memory: Decreased recall of precautions       Problem Solving: Slow processing, Requires verbal cues, Difficulty sequencing General Comments: Alert and oriented. limisted insight to safety and deficits. tangential, requires cues for attention     General Comments  VSS; spent increased time discussing home support, safety and detailing insight to deficits.    Exercises     Shoulder Instructions      Home Living Family/patient expects to be discharged to:: Private residence Living Arrangements: Alone Available Help at Discharge: Family;Friend(s);Available PRN/intermittently Type of Home: House Home Access: Stairs to enter [employer] of Steps: 5 Entrance Stairs-Rails: Right;Left Home Layout: One level     Bathroom Shower/Tub: Chief Strategy Officer: Standard Bathroom Accessibility: Yes How Accessible: Accessible via walker Home Equipment: Grab bars - tub/shower;Shower seat;Toilet riser;Hand held Programmer, systems (2 wheels);Rollator (4 wheels);Cane - single point          Prior Functioning/Environment Prior Level of Function : Independent/Modified Independent             Mobility Comments: RW for mobility ADLs Comments: generally mod I with DME        OT Problem List: Decreased strength;Decreased range of motion;Impaired balance (sitting and/or standing);Decreased activity tolerance;Decreased safety awareness;Decreased knowledge of precautions;Pain      OT Treatment/Interventions: Self-care/ADL training;Therapeutic exercise;DME and/or AE instruction;Therapeutic activities;Patient/family education;Balance training    OT Goals(Current goals can be found in the care plan section) Acute Rehab OT Goals Patient Stated Goal: to go home OT Goal Formulation: With patient Time For Goal  Achievement: 04/17/22 Potential to Achieve Goals: Good  OT Frequency: Min 2X/week    Co-evaluation  AM-PAC OT "6 Clicks" Daily Activity     Outcome Measure Help from another person eating meals?: None Help from another person taking care of personal grooming?: A Little Help from another person toileting, which includes using toliet, bedpan, or urinal?: A Lot Help from another person bathing (including washing, rinsing, drying)?: A Lot Help from another person to put on and taking off regular upper body clothing?: A Little Help from another person to put on and taking off regular lower body clothing?: A Lot 6 Click Score: 16   End of Session Equipment Utilized During Treatment: Gait belt;Rolling walker (2 wheels) Nurse Communication: Mobility status  Activity Tolerance: Patient tolerated treatment well Patient left: in chair;with call bell/phone within reach;with chair alarm set  OT Visit Diagnosis: Unsteadiness on feet (R26.81);Muscle weakness (generalized) (M62.81);Pain                Time: 0931-1010 OT Time Calculation (min): 39 min Charges:  OT General Charges $OT Visit: 1 Visit OT Evaluation $OT Eval Moderate Complexity: 1 Mod OT Treatments $Self Care/Home Management : 8-22 mins $Therapeutic Activity: 8-22 mins   Foy Mungia A Lando Alcalde 04/03/2022, 1:24 PM

## 2022-04-03 NOTE — Progress Notes (Signed)
Physical Therapy Treatment Patient Details Name: [name] MRN: [id number] DOB: 1960/10/19 Today's Date: 04/03/2022   History of Present Illness [name] is a 62 y.o. female who underwent thoracic ten to Lumbar three Posterior lateral arthrodesis with screws 03/22/22. S/p thoracic wound exploration with repair of CSF leak 6/24. PMH: anemia, anxiety, fibromyalgia, GERD, HTN, hyperthyroidism, IBS, ACDF 3/4    PT Comments    Pt is now c/p CSF leak repair 6/24. Pt is demonstrating an ability to ambulate at a min guard assist level without LOB, but at a very slow pace (partially due to poor attention/tangential speech), which indicates a risk for falls. Pt also displays lower extremity weakness impacting her ability to come to stand from low surfaces, requiring modA from the low commode. However, she was able to come to stand with min guard assist from the recliner. Pt is displaying improvement with mobility overall, but appears to have been inconsistent in physical capabilities between her OT session earlier today and her PT session. Due to inconsistencies in her capabilities, still recommend short-term rehab at a SNF unless she can get more frequent assistance at home. Pt appears to likely decline to go to a SNF, so if she goes home, recommend HHPT with max HH services. Will continue to follow acutely.     Recommendations for follow up therapy are one component of a multi-disciplinary discharge planning process, led by the attending physician.  Recommendations may be updated based on patient status, additional functional criteria and insurance authorization.  Follow Up Recommendations  Skilled nursing-short term rehab (<3 hours/day) (denied by AIR; if declines SNF then HHPT with max HH services) Can patient physically be transported by private vehicle: Yes   Assistance Recommended at Discharge Frequent or constant Supervision/Assistance  Patient can return home with the following A  little help with bathing/dressing/bathroom;Help with stairs or ramp for entrance;Assist for transportation;Assistance with cooking/housework;A little help with walking and/or transfers   Equipment Recommendations  None recommended by PT    Recommendations for Other Services       Precautions / Restrictions Precautions Precautions: Back;Fall Precaution Booklet Issued: Yes (comment) Precaution Comments: Verbally reviewed, needs reminders for compliance Required Braces or Orthoses:  (no brace needed) Restrictions Weight Bearing Restrictions: No Other Position/Activity Restrictions: no brace needed     Mobility  Bed Mobility Overal bed mobility: Needs Assistance Bed Mobility: Sit to Supine       Sit to supine: Min guard   General bed mobility comments: Extra time for pt to manage legs onto bed surface holding onto bed rail, min guard for safety.    Transfers Overall transfer level: Needs assistance Equipment used: Rolling walker (2 wheels) Transfers: Sit to/from Stand Sit to Stand: Min guard, Mod assist           General transfer comment: Pt coming to stand from recliner with min guard assist, but required modA to power up to stand from low commode surface using the grab bar to pull up to stand.    Ambulation/Gait Ambulation/Gait assistance: Min guard Gait Distance (Feet): 280 Feet (x2 bouts of ~20 ft > ~280 ft) Assistive device: Rolling walker (2 wheels) Gait Pattern/deviations: Step-through pattern, Decreased stride length, Decreased dorsiflexion - left, Trunk flexed, Decreased dorsiflexion - right, Shuffle, Narrow base of support Gait velocity: reduced Gait velocity interpretation: <1.31 ft/sec, indicative of household ambulator   General Gait Details: Pt with slow gait and flexed posture, cues to correct with momentary success noted. Same for correcting feet  clearance bil. No LOB, min guad for safety. Pt easily distracted and slows speed when talking, needing cues  to increase the pace on occasion.   Stairs             Wheelchair Mobility    Modified Rankin (Stroke Patients Only)       Balance Overall balance assessment: Needs assistance Sitting-balance support: Feet supported, No upper extremity supported Sitting balance-[name]: Good     Standing balance support: During functional activity, Bilateral upper extremity supported, No upper extremity supported Standing balance-[name]: Fair Standing balance comment: Able to stand at sink without UE support to wash hands, reliant on RW for mobility                            Cognition Arousal/Alertness: Awake/alert Behavior During Therapy: WFL for tasks assessed/performed Overall Cognitive Status: Impaired/Different from baseline Area of Impairment: Memory, Attention                   Current Attention Level: Alternating Memory: Decreased recall of precautions         General Comments: Tangential speech at times, needs redirecting. Needs reminders for spinal precautions compliance.        Exercises      General Comments General comments (skin integrity, edema, etc.): VSS on RA      Pertinent Vitals/Pain Pain Assessment Pain Assessment: Faces Faces Pain Scale: Hurts a little bit Pain Location: back Pain Descriptors / Indicators: Discomfort, Grimacing, Operative site guarding Pain Intervention(s): Limited activity within patient's tolerance, Monitored during session, Repositioned    Home Living                          Prior Function            PT Goals (current goals can now be found in the care plan section) Acute Rehab PT Goals Patient Stated Goal: to go home PT Goal Formulation: With patient Time For Goal Achievement: 04/09/22 Potential to Achieve Goals: Good Progress towards PT goals: Progressing toward goals    Frequency    Min 5X/week      PT Plan Current plan remains appropriate    Co-evaluation               AM-PAC PT "6 Clicks" Mobility   Outcome Measure  Help needed turning from your back to your side while in a flat bed without using bedrails?: A Little Help needed moving from lying on your back to sitting on the side of a flat bed without using bedrails?: A Little Help needed moving to and from a bed to a chair (including a wheelchair)?: A Little Help needed standing up from a chair using your arms (e.g., wheelchair or bedside chair)?: A Little Help needed to walk in hospital room?: A Little Help needed climbing 3-5 steps with a railing? : A Lot 6 Click Score: 17    End of Session Equipment Utilized During Treatment: Gait belt Activity Tolerance: Patient tolerated treatment well Patient left: with call bell/phone within reach;in bed;with bed alarm set Nurse Communication: Mobility status PT Visit Diagnosis: Difficulty in walking, not elsewhere classified (R26.2);Muscle weakness (generalized) (M62.81);Unsteadiness on feet (R26.81);Other abnormalities of gait and mobility (R26.89)     Time: 1245-8099 PT Time Calculation (min) (ACUTE ONLY): 38 min  Charges:  $Gait Training: 23-37 mins $Therapeutic Activity: 8-22 mins  Raymond Gurney, PT, DPT Acute Rehabilitation Services  Office: [phone]    Jewel Baize 04/03/2022, 5:02 PM

## 2022-04-04 LAB — BASIC METABOLIC PANEL
Anion gap: 11 (ref 5–15)
BUN: 21 mg/dL — ABNORMAL HIGH (ref 6–20)
CO2: 31 mmol/L (ref 22–32)
Calcium: 9.4 mg/dL (ref 8.9–10.3)
Chloride: 96 mmol/L — ABNORMAL LOW (ref 98–111)
Creatinine, Ser: 0.67 mg/dL (ref 0.44–1.00)
GFR, Estimated: >60 mL/min (ref >60)
Glucose, Bld: 97 mg/dL (ref 70–99)
Potassium: 3.7 mmol/L (ref 3.5–5.1)
Sodium: 138 mmol/L (ref 135–145)

## 2022-04-04 MED ORDER — LUBIPROSTONE 24 MCG PO CAPS
48.0000 ug | ORAL_CAPSULE | Freq: Every day | ORAL | Status: DC
  Administered 2022-04-05 – 2022-05-01 (×27): 48 ug via ORAL
  Filled 2022-04-04 (×27): qty 2

## 2022-04-04 NOTE — TOC Progression Note (Addendum)
Transition of Care [facility]) - Progression Note    Patient Details  Name: [name] MRN: [id number] Date of Birth: 1960/10/13  Transition of Care [facility]) CM/SW Contact  Lockie Pares, RN Phone Number: 04/04/2022, 12:45 PM  Clinical Narrative:     Discussed home health with patient,She has decided to go home. She is looking at [facility] services, with PCP. She was vocal about her experience at [facility].and does not want to experience  that again. [facility] had [facility] and is interested in having them again.  Messaged Morrie Sheldon for acceptance. Patient will likely need PT OT social work and disease management, RN to check wound.  [facility], just need orders placed prior to DC.  Patient discussing routine of medications she takes at home, RN will message pharmacy for assistance in changing times to fit her normal schedule.  TOC will continue to follow for further needs, recommendations, and transitions  Expected Discharge Plan: Home w Home Health Services Barriers to Discharge: Continued Medical Work up  Expected Discharge Plan and Services Expected Discharge Plan: Home w Home Health Services In-house Referral: Clinical Social Work Discharge Planning Services: CM Consult Post Acute Care Choice: Home Health Living arrangements for the past 2 months: Single Family Home                           HH Arranged: PT, OT, Social Work, Disease Management HH Agency: [facility] Date HH Agency Contacted: 04/04/22 Time HH Agency Contacted: 1245 Representative spoke with at [facility] Agency: Morrie Sheldon   Social Determinants of Health (SDOH) Interventions    Readmission Risk Interventions     No data to display

## 2022-04-04 NOTE — Progress Notes (Signed)
PT wanting me to contact Dr. Franky Macho in regards to he meds. She stated she normally takes both of her Amitiza doses daily at 1200 with lunch to prevent severe constipation.  She also asked how much oral potassium she was taking and at this time she is on 10 MEQ oral daily. She states her PCP was increasing her potasium to twice daily. Her last BMP on 06/26 had potasium of 4.5.  Will reach out to Dr. Franky Macho via pager.

## 2022-04-04 NOTE — Progress Notes (Signed)
Physical Therapy Treatment Patient Details Name: [name] MRN: [id number] DOB: 1961/04/21 Today's Date: 04/04/2022   History of Present Illness [name] is a 61 y.o. female who underwent thoracic ten to Lumbar three Posterior lateral arthrodesis with screws 03/22/22. S/p thoracic wound exploration with repair of CSF leak 6/24. PMH: anemia, anxiety, fibromyalgia, GERD, HTN, hyperthyroidism, IBS, ACDF 3/4    PT Comments    Pt is continuing to display consistent improvement in mobility with PT, only requiring supervision for bed mobility and ambulating with a RW. Pt was also able to navigate x8 stairs with 1-2 UE support on a handrail at a min guard assist level, displaying some mild instability. However, pt continues to demonstrate lower extremity weakness impacting her ability to come to stand from low surfaces that do not have arm rests to push up from, requiring minA today. Pt reports all her surfaces without arm rests are higher or all her standard chairs have arm rests. Considering her consistent progress with mobility with PT, updating PT recs to HHPT with HH services and intermittent supervision/assistance. However, pt has been inconsistent in presentation at times between PT, OT, and nursing staff.     Recommendations for follow up therapy are one component of a multi-disciplinary discharge planning process, led by the attending physician.  Recommendations may be updated based on patient status, additional functional criteria and insurance authorization.  Follow Up Recommendations  Home health PT (with [facility] services, provided pt remains consistent in presentation, otherwise SNF) Can patient physically be transported by private vehicle: Yes   Assistance Recommended at Discharge Intermittent Supervision/Assistance  Patient can return home with the following A little help with bathing/dressing/bathroom;Help with stairs or ramp for entrance;Assist for transportation;Assistance with  cooking/housework;A little help with walking and/or transfers   Equipment Recommendations  None recommended by PT    Recommendations for Other Services       Precautions / Restrictions Precautions Precautions: Back;Fall Precaution Booklet Issued: Yes (comment) Precaution Comments: Verbally reviewed, recalls 2/3, needs reminders for compliance Required Braces or Orthoses:  (no brace needed) Restrictions Weight Bearing Restrictions: No Other Position/Activity Restrictions: no brace needed     Mobility  Bed Mobility Overal bed mobility: Needs Assistance Bed Mobility: Rolling, Sidelying to Sit Rolling: Supervision Sidelying to sit: Supervision, HOB elevated       General bed mobility comments: Extra time and cues to log roll and sit up EOB using bed rails and HOB elevated, supervision for safety.    Transfers Overall transfer level: Needs assistance Equipment used: Rolling walker (2 wheels) Transfers: Sit to/from Stand Sit to Stand: Min assist           General transfer comment: Cued pt to try to stand from low EOB surface to simulate more standard seat heights, but pt a bit resistive, but eventually attempted and came to stand. Initially, only needed min guard assist but as pt began getting upset stating "I am not going to hurt myself doing anything for you or anyone else", she had a posterior trunk sway needing minA to correct. MinA to power up from commode using grab bar.    Ambulation/Gait Ambulation/Gait assistance: Supervision Gait Distance (Feet): 315 Feet (x2 bouts of ~30 ft > ~315 ft) Assistive device: Rolling walker (2 wheels) Gait Pattern/deviations: Step-through pattern, Decreased stride length, Decreased dorsiflexion - left, Trunk flexed, Decreased dorsiflexion - right, Shuffle, Narrow base of support Gait velocity: reduced Gait velocity interpretation: <1.8 ft/sec, indicate of risk for recurrent falls (1.43 ft/sec)  General Gait Details: Pt with slow,  but improved gait speed. Pt with flexed posture and decreased feet clearance bil. No LOB, supervision for safety.   Stairs Stairs: Yes Stairs assistance: Min guard Stair Management: One rail Right, Step to pattern, Forwards Number of Stairs: 8 General stair comments: Ascends leading with L foot and bil hands on R rail, semi-turned body. Descends leading with R foot and R hand on R rail, forward. No LOB, but instability noted, min guard for safety.   Wheelchair Mobility    Modified Rankin (Stroke Patients Only)       Balance Overall balance assessment: Needs assistance Sitting-balance support: Feet supported, No upper extremity supported Sitting balance-[name]: Good     Standing balance support: During functional activity, Bilateral upper extremity supported, No upper extremity supported Standing balance-[name]: Fair Standing balance comment: Able to stand statically without UE support, reliant on RW or rails for mobility                            Cognition Arousal/Alertness: Awake/alert Behavior During Therapy: WFL for tasks assessed/performed Overall Cognitive Status: Impaired/Different from baseline Area of Impairment: Memory, Attention                   Current Attention Level: Alternating Memory: Decreased recall of precautions         General Comments: Tangential speech at times, needs redirecting. Needs reminders for spinal precautions compliance. Pt a bit resistive to cues to try to come to stand from lower surfaces to ensure she can do what is needed to go home, stating "I am not going to hurt myself doing anything for you or anyone else".        Exercises      General Comments General comments (skin integrity, edema, etc.): VSS on RA      Pertinent Vitals/Pain Pain Assessment Pain Assessment: Faces Faces Pain Scale: Hurts a little bit Pain Location: back Pain Descriptors / Indicators: Discomfort, Grimacing, Operative site  guarding Pain Intervention(s): Limited activity within patient's tolerance, Monitored during session, Repositioned, Premedicated before session    Home Living                          Prior Function            PT Goals (current goals can now be found in the care plan section) Acute Rehab PT Goals Patient Stated Goal: to go home PT Goal Formulation: With patient Time For Goal Achievement: 04/09/22 Potential to Achieve Goals: Good Progress towards PT goals: Progressing toward goals    Frequency    Min 5X/week      PT Plan Discharge plan needs to be updated    Co-evaluation              AM-PAC PT "6 Clicks" Mobility   Outcome Measure  Help needed turning from your back to your side while in a flat bed without using bedrails?: A Little Help needed moving from lying on your back to sitting on the side of a flat bed without using bedrails?: A Little Help needed moving to and from a bed to a chair (including a wheelchair)?: A Little Help needed standing up from a chair using your arms (e.g., wheelchair or bedside chair)?: A Little Help needed to walk in hospital room?: A Little Help needed climbing 3-5 steps with a railing? : A Little 6 Click  Score: 18    End of Session Equipment Utilized During Treatment: Gait belt Activity Tolerance: Patient tolerated treatment well Patient left: with call bell/phone within reach;in chair;with chair alarm set Nurse Communication: Mobility status PT Visit Diagnosis: Difficulty in walking, not elsewhere classified (R26.2);Muscle weakness (generalized) (M62.81);Unsteadiness on feet (R26.81);Other abnormalities of gait and mobility (R26.89)     Time: 1829-9371 PT Time Calculation (min) (ACUTE ONLY): 38 min  Charges:  $Gait Training: 23-37 mins $Therapeutic Activity: 8-22 mins                     Raymond Gurney, PT, DPT Acute Rehabilitation Services  Office: [phone]    Jewel Baize 04/04/2022, 2:12 PM

## 2022-04-05 LAB — CBC WITH DIFFERENTIAL/PLATELET
Abs Immature Granulocytes: 0.19 10*3/uL — ABNORMAL HIGH (ref 0.00–0.07)
Basophils Absolute: 0.1 10*3/uL (ref 0.0–0.1)
Basophils Relative: 0 %
Eosinophils Absolute: 0 10*3/uL (ref 0.0–0.5)
Eosinophils Relative: 0 %
HCT: 29.8 % — ABNORMAL LOW (ref 36.0–46.0)
Hemoglobin: 8.7 g/dL — ABNORMAL LOW (ref 12.0–15.0)
Immature Granulocytes: 1 %
Lymphocytes Relative: 9 %
Lymphs Abs: 2.3 10*3/uL (ref 0.7–4.0)
MCH: 22 pg — ABNORMAL LOW (ref 26.0–34.0)
MCHC: 29.2 g/dL — ABNORMAL LOW (ref 30.0–36.0)
MCV: 75.3 fL — ABNORMAL LOW (ref 80.0–100.0)
Monocytes Absolute: 2.3 10*3/uL — ABNORMAL HIGH (ref 0.1–1.0)
Monocytes Relative: 9 %
Neutro Abs: 22.1 10*3/uL — ABNORMAL HIGH (ref 1.7–7.7)
Neutrophils Relative %: 81 %
Platelets: 511 10*3/uL — ABNORMAL HIGH (ref 150–400)
RBC: 3.96 MIL/uL (ref 3.87–5.11)
RDW: 18.5 % — ABNORMAL HIGH (ref 11.5–15.5)
WBC: 26.9 10*3/uL — ABNORMAL HIGH (ref 4.0–10.5)
nRBC: 0.1 % (ref 0.0–0.2)

## 2022-04-05 MED ORDER — VANCOMYCIN HCL 1250 MG/250ML IV SOLN
1250.0000 mg | INTRAVENOUS | Status: DC
  Administered 2022-04-05 – 2022-04-07 (×3): 1250 mg via INTRAVENOUS
  Filled 2022-04-05 (×6): qty 250

## 2022-04-05 NOTE — Progress Notes (Signed)
Patient ID: [name], female   DOB: 05-05-1961, 61 y.o.   MRN: [id number] BP (!) 112/53 (BP Location: Right Arm)   Pulse (!) 105   Temp (!) 103.5 F (39.7 C) (Oral)   Resp 20   Ht 5\' 3"  (1.6 m)   Wt 93.9 kg   LMP 11/20/2012   SpO2 98%   BMI 36.67 kg/m  Febrile, wbc is at 26.9 Sending blood cx, and urine cx Wound looks good. Certainly less energetic today compared to yesterday Consult ID tomorrow.  Moving all extremities

## 2022-04-05 NOTE — Progress Notes (Signed)
Noted earlier a decrease in mental status and new fever with headache and body aches. No noted drainage from incision. Concerned for Sepsis. Had attempted multiple pages through Dr. Sueanne Margarita office and voice mails to his staff. Dr. Franky Macho was in OR and I did voice concerns over phone directly to Dr. Franky Macho for sepsis. V/S frequency increased due to red MEWS. For fever, PT had received PRN tylenol, wet wash cloths, fans blowing across her and ice packs. PT more alert but not back to baseline mental status. All sedating meds were held this morning. PT remained at current level of care at this time.    04/05/22 1414  Assess: MEWS Score  Temp (!) 101.6 F (38.7 C)  BP (!) 106/47  MAP (mmHg) (!) 63  Level of Consciousness Alert  O2 Device Nasal Cannula  Patient Activity (if Appropriate) In bed  O2 Flow Rate (L/min) 5 L/min  Assess: MEWS Score  MEWS Temp 2  MEWS Systolic 0  MEWS Pulse 1  MEWS RR 1  MEWS LOC 0  MEWS Score 4  MEWS Score Color Red  Assess: if the MEWS score is Yellow or Red  Were vital signs taken at a resting state? Yes  Focused Assessment Change from prior assessment (see assessment flowsheet)  Does the patient meet 2 or more of the SIRS criteria? Yes  Does the patient have a confirmed or suspected source of infection? Yes  Provider and Rapid Response Notified? Yes  MEWS guidelines implemented *See Row Information* No, vital signs rechecked  Treat  MEWS Interventions Escalated (See documentation below)  Pain Scale 0-10  Pain Score 6  Pain Type Surgical pain  Take Vital Signs  Increase Vital Sign Frequency  Red: Q 1hr X 4 then Q 4hr X 4, if remains red, continue Q 4hrs  Escalate  MEWS: Escalate Red: discuss with charge nurse/RN and provider, consider discussing with RRT  Notify: Provider  Provider Name/Title Coletta Memos  Date Provider Notified 04/05/22  Time Provider Notified 1245  Method of Notification Call  Notification Reason Change in status  Provider  response Evaluate remotely;See new orders  Date of Provider Response 04/05/22  Time of Provider Response 1245  Document  Patient Outcome Stabilized after interventions  Assess: SIRS CRITERIA  SIRS Temperature  1  SIRS Pulse 1  SIRS Respirations  1  SIRS WBC 0  SIRS Score Sum  3

## 2022-04-05 NOTE — Progress Notes (Signed)
PT back to prior baseline mental status. Fever is improving to now 101.72F. PT did not remember events from this morning. No longer having headache. CBC showed increased WBC. Urine in pending collection. MEWS is now green.

## 2022-04-05 NOTE — Progress Notes (Signed)
Noted PT not as alert as she had been but answering questions appropriately. PT warm to the touch and was found with an oral temperature of 102.34F. Cooling with wet towels and ice to armpits started. PT received tylenol PO. Spoke with Dr. Sueanne Margarita office and left message. Will re attempt contact if no timely call back. PT also has headache but can look down without any neck rigidity.

## 2022-04-05 NOTE — Progress Notes (Signed)
Pharmacy Antibiotic Note  [name] is a 61 y.o. female admitted on 03/22/2022 with  thoracolumbar wound .  Pharmacy has been consulted for vanc dosing.  Pt is s/p revision of thoracic wound and CSF leak. Febrile tonight of 103.5. Blood and urine culture sent. Vanc ordered empirically for wound infection. Plan to consult ID in AM.   Scr 0.67 WBC up to 26.9  Plan: Vanc 1.25g IV q24>>AUC 472, scr 0.8  Height: 5\' 3"  (160 cm) Weight: 93.9 kg (207 lb) IBW/kg (Calculated) : 52.4  Temp (24hrs), Avg:101.2 F (38.4 C), Min:98.1 F (36.7 C), Max:103.6 F (39.8 C)  Recent Labs  Lab 04/04/22 2017 04/05/22 1430  WBC  --  26.9*  CREATININE 0.67  --     Estimated Creatinine Clearance: 82.5 mL/min (by C-G formula based on SCr of 0.67 mg/dL).    Allergies  Allergen Reactions   Monosodium Glutamate Anaphylaxis and Swelling    Eyes swollen shut, facial swelling, tongue swelling.   Shellfish Allergy Anaphylaxis   Baclofen Other (See Comments)   Celebrex [Celecoxib] Itching    Only allergic to generic brand   Contrast Media [Iodinated Contrast Media] Itching and Nausea Only    "could not walk"   Diclofenac Itching    Generic Diclefenac gel causes itching. Can take the name brand Voltaren gel   Molds & Smuts Other (See Comments)   Other Other (See Comments)    Pet dander, Mildew, Mold  Cannot tolerate generic DICLOFENAC GEL--MUST USE BRAND NAME (GENERIC CAUSES ITCHING)    Sulfa Antibiotics    Latex Itching and Rash    Latex glove with powder    Antimicrobials this admission: 6/30 vanc>>  Dose adjustments this admission:   Microbiology results: 6/30 urine>> 6/30 blood>>  7/30, PharmD, [place], AAHIVP, CPP Infectious Disease Pharmacist 04/05/2022 8:37 PM

## 2022-04-05 NOTE — Progress Notes (Signed)
PT Cancellation Note  Patient Details Name: [name] MRN: [id number] DOB: 10-09-1960   Cancelled Treatment:    Reason Eval/Treat Not Completed: Patient declined, no reason specified. Attempted 2x, but pt eating requesting PT return later upon first attempt. Upon second attempt, pt reported not feeling well and that she had a fever of "103", which RN confirmed and reported it had decreased now to "101". Pt politely declining bed level exercises, sitting EOB, or OOB mobility. Will plan to follow-up another day as able.   Raymond Gurney, PT, DPT Acute Rehabilitation Services  Office: [phone]    Jewel Baize 04/05/2022, 5:02 PM

## 2022-04-06 LAB — URINALYSIS, ROUTINE W REFLEX MICROSCOPIC
Bilirubin Urine: NEGATIVE
Glucose, UA: NEGATIVE mg/dL
Hgb urine dipstick: NEGATIVE
Ketones, ur: NEGATIVE mg/dL
Nitrite: POSITIVE — AB
Protein, ur: 30 mg/dL — AB
Specific Gravity, Urine: 1.024 (ref 1.005–1.030)
pH: 5 (ref 5.0–8.0)

## 2022-04-06 NOTE — Progress Notes (Signed)
Pt's VS including temp was stable during shift until change of shift when NT notified RN of pt's temp 102.8 orally. RN went to reassess pt's VS and temp was 99.2 orally. Prn tylenol administered regardless. Pt UA was collected and sent as ordered. Back incision dsg with drainage. Reported off to oncoming RN. Dionne Bucy RN   04/06/22 1925  Vitals  Temp 99.2 F (37.3 C)  Temp Source Oral  BP 127/60  MAP (mmHg) 76  BP Location Right Arm  BP Method Automatic  Patient Position (if appropriate) Lying  Pulse Rate 95  Pulse Rate Source Monitor  ECG Heart Rate 95  Resp 20  Level of Consciousness  Level of Consciousness Alert  MEWS COLOR  MEWS Score Color Green  Oxygen Therapy  SpO2 90 %  O2 Device Room Air  MEWS Score  MEWS Temp 0  MEWS Systolic 0  MEWS Pulse 0  MEWS RR 0  MEWS LOC 0  MEWS Score 0

## 2022-04-06 NOTE — Progress Notes (Addendum)
Subjective: Patient reports some sinus headaches, but no headaches like she had previously.  Pain controlled.  Objective: Vital signs in last 24 hours: Temp:  [98.1 F (36.7 C)-103.6 F (39.8 C)] 99.2 F (37.3 C) (07/01 0816) Pulse Rate:  [93-105] 96 (07/01 0816) Resp:  [15-22] 15 (07/01 0816) BP: (96-119)/(46-57) 103/55 (07/01 0816) SpO2:  [91 %-100 %] 100 % (07/01 0816)  Intake/Output from previous day: 06/30 0701 - 07/01 0700 In: 250 [IV Piggyback:250] Out: 1300 [Urine:1300] Intake/Output this shift: No intake/output data recorded.  Awake alert Incision c/d without evidence of drainage MAEW x 4  Lab Results: Recent Labs    04/05/22 1430  WBC 26.9*  HGB 8.7*  HCT 29.8*  PLT 511*   BMET Recent Labs    04/04/22 2017  NA 138  K 3.7  CL 96*  CO2 31  GLUCOSE 97  BUN 21*  CREATININE 0.67  CALCIUM 9.4    Studies/Results: No results found.  Assessment/Plan: S/p PLIF and recent wound revision  LOS: 15 days  - continue PT/OT, mobilize - WBC count elevated yesterday, with fevers.  Vancomycin was started empirically. It does not appear any cultures were taken in OR as there was no clinical evidence of infection at that time - will f/u blood cultures, urine culture - she is on aspirin, heparin ppx    [name] 04/06/2022, 10:44 AM

## 2022-04-06 NOTE — Progress Notes (Signed)
Physical Therapy Treatment Patient Details Name: [name] MRN: [id number] DOB: July 13, 1961 Today's Date: 04/06/2022   History of Present Illness [name] is a 61 y.o. female who underwent thoracic ten to Lumbar three Posterior lateral arthrodesis with screws 03/22/22. S/p thoracic wound exploration with repair of CSF leak 6/24. PMH: anemia, anxiety, fibromyalgia, GERD, HTN, hyperthyroidism, IBS, ACDF 3/4    PT Comments    Pt requiring increased assist today compared to previous session. She has not mobilized in 48 hours due to concern for continued CSF leak. She required mod assist sidelying to sit, mod assist sit to stand, and min assist SPT with RW. Bilat knee buckling and overall 'jerking' movements noted in stance but no overt LOB. Pt perseverating on need to give RN a urine sample. Purewick placed EOB and BSC offered, but pt unable to pee. She had been NPO up until time of PT session due to possible return to OR. Diet order placed just prior to PT. Pt advised to drink fluids which may help her be able to urinate. Pt in recliner at end of session.   Recommendations for follow up therapy are one component of a multi-disciplinary discharge planning process, led by the attending physician.  Recommendations may be updated based on patient status, additional functional criteria and insurance authorization.  Follow Up Recommendations  Home health PT (will need to consider SNF, if need for increased assist persists) Can patient physically be transported by private vehicle: Yes   Assistance Recommended at Discharge Intermittent Supervision/Assistance  Patient can return home with the following A little help with bathing/dressing/bathroom;Help with stairs or ramp for entrance;Assist for transportation;Assistance with cooking/housework;A little help with walking and/or transfers   Equipment Recommendations  None recommended by PT    Recommendations for Other Services        Precautions / Restrictions Precautions Precautions: Back;Fall Restrictions Other Position/Activity Restrictions: no brace needed     Mobility  Bed Mobility Overal bed mobility: Needs Assistance Bed Mobility: Rolling, Sidelying to Sit Rolling: Supervision Sidelying to sit: Mod assist, HOB elevated       General bed mobility comments: +rail, increased time, multiple attempts to eventually achieve upright sit    Transfers Overall transfer level: Needs assistance Equipment used: Rolling walker (2 wheels) Transfers: Sit to/from Stand, Bed to chair/wheelchair/BSC Sit to Stand: Mod assist, From elevated surface   Step pivot transfers: Min assist       General transfer comment: sit to stand x 4 trials from EOB. Pt continued to return to sitting, stating "I think I need to pee." On fourth trial, pt able to stay on task to take pivot steps to recliner. Knee buckling noted bilat but pt able to self correct.    Ambulation/Gait                   Stairs             Wheelchair Mobility    Modified Rankin (Stroke Patients Only)       Balance Overall balance assessment: Needs assistance Sitting-balance support: Feet supported, No upper extremity supported Sitting balance-[name]: Fair     Standing balance support: During functional activity, Bilateral upper extremity supported, Reliant on assistive device for balance Standing balance-[name]: Poor                              Cognition Arousal/Alertness: Awake/alert Behavior During Therapy: WFL for tasks assessed/performed Overall  Cognitive Status: Impaired/Different from baseline Area of Impairment: Memory, Attention, Problem solving                   Current Attention Level: Selective Memory: Decreased short-term memory, Decreased recall of precautions       Problem Solving: Slow processing, Requires verbal cues, Difficulty sequencing General Comments: difficulty staying on  task. Perseverating on need to give RN a urine sample.        Exercises      General Comments General comments (skin integrity, edema, etc.): VSS on RA      Pertinent Vitals/Pain Pain Assessment Pain Assessment: Faces Faces Pain Scale: Hurts little more Pain Location: back Pain Descriptors / Indicators: Discomfort, Grimacing, Operative site guarding Pain Intervention(s): Limited activity within patient's tolerance, Repositioned, Monitored during session    Home Living                          Prior Function            PT Goals (current goals can now be found in the care plan section) Acute Rehab PT Goals Patient Stated Goal: home Progress towards PT goals: Not progressing toward goals - comment (Has been in bed x 48 hours due to concern for continued CSF leak.)    Frequency    Min 5X/week      PT Plan Current plan remains appropriate    Co-evaluation              AM-PAC PT "6 Clicks" Mobility   Outcome Measure  Help needed turning from your back to your side while in a flat bed without using bedrails?: A Little Help needed moving from lying on your back to sitting on the side of a flat bed without using bedrails?: A Lot Help needed moving to and from a bed to a chair (including a wheelchair)?: A Little Help needed standing up from a chair using your arms (e.g., wheelchair or bedside chair)?: A Lot Help needed to walk in hospital room?: A Lot Help needed climbing 3-5 steps with a railing? : A Lot 6 Click Score: 14    End of Session Equipment Utilized During Treatment: Gait belt Activity Tolerance: Patient tolerated treatment well Patient left: in chair;with call bell/phone within reach;with chair alarm set Nurse Communication: Mobility status PT Visit Diagnosis: Difficulty in walking, not elsewhere classified (R26.2);Muscle weakness (generalized) (M62.81);Unsteadiness on feet (R26.81);Other abnormalities of gait and mobility (R26.89)      Time: 1500-1540 PT Time Calculation (min) (ACUTE ONLY): 40 min  Charges:  $Gait Training: 8-22 mins $Therapeutic Activity: 23-37 mins                     Aida Raider, [place]  Office # [phone] Pager [phone]    Ilda Foil 04/06/2022, 4:03 PM

## 2022-04-07 ENCOUNTER — Inpatient Hospital Stay (HOSPITAL_COMMUNITY): Payer: Medicare Other

## 2022-04-07 LAB — CBC
HCT: 28.8 % — ABNORMAL LOW (ref 36.0–46.0)
Hemoglobin: 8.4 g/dL — ABNORMAL LOW (ref 12.0–15.0)
MCH: 22 pg — ABNORMAL LOW (ref 26.0–34.0)
MCHC: 29.2 g/dL — ABNORMAL LOW (ref 30.0–36.0)
MCV: 75.6 fL — ABNORMAL LOW (ref 80.0–100.0)
Platelets: 511 10*3/uL — ABNORMAL HIGH (ref 150–400)
RBC: 3.81 MIL/uL — ABNORMAL LOW (ref 3.87–5.11)
RDW: 18.7 % — ABNORMAL HIGH (ref 11.5–15.5)
WBC: 20.3 10*3/uL — ABNORMAL HIGH (ref 4.0–10.5)
nRBC: 0 % (ref 0.0–0.2)

## 2022-04-07 LAB — BASIC METABOLIC PANEL
Anion gap: 16 — ABNORMAL HIGH (ref 5–15)
BUN: 19 mg/dL (ref 6–20)
CO2: 26 mmol/L (ref 22–32)
Calcium: 9.1 mg/dL (ref 8.9–10.3)
Chloride: 94 mmol/L — ABNORMAL LOW (ref 98–111)
Creatinine, Ser: 0.68 mg/dL (ref 0.44–1.00)
GFR, Estimated: >60 mL/min (ref >60)
Glucose, Bld: 118 mg/dL — ABNORMAL HIGH (ref 70–99)
Potassium: 3 mmol/L — ABNORMAL LOW (ref 3.5–5.1)
Sodium: 136 mmol/L (ref 135–145)

## 2022-04-07 MED ORDER — NITROFURANTOIN MONOHYD MACRO 100 MG PO CAPS
100.0000 mg | ORAL_CAPSULE | Freq: Two times a day (BID) | ORAL | Status: DC
Start: 2022-04-07 — End: 2022-04-09
  Administered 2022-04-07 – 2022-04-09 (×5): 100 mg via ORAL
  Filled 2022-04-07 (×6): qty 1

## 2022-04-07 NOTE — Progress Notes (Addendum)
Pt's dressing completely saturated in serous and in some areas purulent fluid. 2 ABDs placed with compression tape. Pt also endorses headaches, which she describes as sinus headaches but also says they worsen when walking/sitting up.   Verbal orders for CBC, BMP, MRI lumbar/lower thoracic from Docia Barrier, NP.  Robina Ade, RN

## 2022-04-07 NOTE — Plan of Care (Signed)

## 2022-04-07 NOTE — Progress Notes (Signed)
Mobility Specialist Criteria Algorithm Info.   04/07/22 1614  Mobility  Activity Ambulated with assistance in room;Ambulated with assistance to bathroom  Range of Motion/Exercises Active;All extremities  Level of Assistance Contact guard assist, steadying assist  Assistive Device Front wheel walker  Distance Ambulated (ft) 25 ft  Activity Response Tolerated well   Patient received in supine agreeable to participate in mobility. Was independent for bed mobility and required min A + cues sit>stand. Ambulated short distance in room min guard with steady gait. Tolerated without complaint or incident. Was left in bathroom with all needs met, call bell in reach.   04/07/2022 4:50 PM  [name], [place], [place]  POIPP:[phone] Office: [phone]

## 2022-04-07 NOTE — Progress Notes (Signed)
Subjective: Patient comfortable, afebrile this morning.  Reports some sinus headache but no low pressure or positional headache like previously.  Objective: Vital signs in last 24 hours: Temp:  [98.2 F (36.8 C)-102.8 F (39.3 C)] 98.8 F (37.1 C) (07/02 0800) Pulse Rate:  [70-95] 86 (07/02 0800) Resp:  [10-20] 10 (07/02 0301) BP: (108-127)/(55-66) 113/66 (07/02 0800) SpO2:  [90 %-100 %] 90 % (07/02 0800)  Intake/Output from previous day: 07/01 0701 - 07/02 0700 In: 490.1 [P.O.:240; IV Piggyback:250.1] Out: 1000 [Urine:1000] Intake/Output this shift: No intake/output data recorded.  NAD Alert, Ox3 MAEs well Incision with serosanguinous drainage able to be expressed.  Dressing applied  Lab Results: Recent Labs    04/05/22 1430  WBC 26.9*  HGB 8.7*  HCT 29.8*  PLT 511*   BMET Recent Labs    04/04/22 2017  NA 138  K 3.7  CL 96*  CO2 31  GLUCOSE 97  BUN 21*  CREATININE 0.67  CALCIUM 9.4    Studies/Results: No results found.  Assessment/Plan: 61 yo F s/p thoracolumbar fusion, now s/p wound revision and repair of CSF leak - will start Macrobid for UTI (patient allergic to sulfa drugs) - labs in am - if leukocytosis and drainage from back persists, she may need another MRI of her spine  [name] 04/07/2022, 9:42 AM

## 2022-04-08 ENCOUNTER — Inpatient Hospital Stay (HOSPITAL_COMMUNITY): Payer: Medicare Other | Admitting: Anesthesiology

## 2022-04-08 ENCOUNTER — Encounter (HOSPITAL_COMMUNITY)
Admission: RE | Disposition: A | Discharge status: Home Health | Payer: Self-pay | Source: Home / Self Care | Attending: Neurosurgery

## 2022-04-08 ENCOUNTER — Other Ambulatory Visit: Payer: Self-pay

## 2022-04-08 ENCOUNTER — Inpatient Hospital Stay (HOSPITAL_COMMUNITY): Payer: Medicare Other

## 2022-04-08 ENCOUNTER — Encounter (HOSPITAL_COMMUNITY): Payer: Self-pay | Admitting: Neurosurgery

## 2022-04-08 DIAGNOSIS — T8149XA Infection following a procedure, other surgical site, initial encounter: Secondary | ICD-10-CM | POA: Diagnosis present

## 2022-04-08 DIAGNOSIS — T8189XA Other complications of procedures, not elsewhere classified, initial encounter: Secondary | ICD-10-CM

## 2022-04-08 DIAGNOSIS — I10 Essential (primary) hypertension: Secondary | ICD-10-CM | POA: Diagnosis not present

## 2022-04-08 DIAGNOSIS — J449 Chronic obstructive pulmonary disease, unspecified: Secondary | ICD-10-CM

## 2022-04-08 DIAGNOSIS — Z87891 Personal history of nicotine dependence: Secondary | ICD-10-CM | POA: Diagnosis not present

## 2022-04-08 HISTORY — PX: LUMBAR WOUND DEBRIDEMENT: SHX1988

## 2022-04-08 LAB — CBC
HCT: 25.6 % — ABNORMAL LOW (ref 36.0–46.0)
Hemoglobin: 7.4 g/dL — ABNORMAL LOW (ref 12.0–15.0)
MCH: 21.6 pg — ABNORMAL LOW (ref 26.0–34.0)
MCHC: 28.9 g/dL — ABNORMAL LOW (ref 30.0–36.0)
MCV: 74.6 fL — ABNORMAL LOW (ref 80.0–100.0)
Platelets: 495 10*3/uL — ABNORMAL HIGH (ref 150–400)
RBC: 3.43 MIL/uL — ABNORMAL LOW (ref 3.87–5.11)
RDW: 18.5 % — ABNORMAL HIGH (ref 11.5–15.5)
WBC: 15.5 10*3/uL — ABNORMAL HIGH (ref 4.0–10.5)
nRBC: 0.1 % (ref 0.0–0.2)

## 2022-04-08 LAB — BASIC METABOLIC PANEL
Anion gap: 11 (ref 5–15)
BUN: 16 mg/dL (ref 6–20)
CO2: 28 mmol/L (ref 22–32)
Calcium: 9.1 mg/dL (ref 8.9–10.3)
Chloride: 98 mmol/L (ref 98–111)
Creatinine, Ser: 0.55 mg/dL (ref 0.44–1.00)
GFR, Estimated: >60 mL/min (ref >60)
Glucose, Bld: 139 mg/dL — ABNORMAL HIGH (ref 70–99)
Potassium: 3.4 mmol/L — ABNORMAL LOW (ref 3.5–5.1)
Sodium: 137 mmol/L (ref 135–145)

## 2022-04-08 LAB — URINE CULTURE
Culture: >=100000 — AB
Special Requests: NORMAL

## 2022-04-08 LAB — TYPE AND SCREEN
ABO/RH(D): B POS
Antibody Screen: NEGATIVE

## 2022-04-08 SURGERY — LUMBAR WOUND DEBRIDEMENT
Anesthesia: General | Site: Back

## 2022-04-08 MED ORDER — LIDOCAINE 2% (20 MG/ML) 5 ML SYRINGE
INTRAMUSCULAR | Status: DC | PRN
  Administered 2022-04-08: 60 mg via INTRAVENOUS

## 2022-04-08 MED ORDER — ONDANSETRON HCL 4 MG/2ML IJ SOLN
INTRAMUSCULAR | Status: DC | PRN
  Administered 2022-04-08: 4 mg via INTRAVENOUS

## 2022-04-08 MED ORDER — PROPOFOL 10 MG/ML IV BOLUS
INTRAVENOUS | Status: DC | PRN
  Administered 2022-04-08: 150 mg via INTRAVENOUS

## 2022-04-08 MED ORDER — LACTATED RINGERS IV SOLN: INTRAVENOUS | Status: DC | PRN

## 2022-04-08 MED ORDER — FENTANYL CITRATE (PF) 250 MCG/5ML IJ SOLN
INTRAMUSCULAR | Status: DC | PRN
Start: 2022-04-08 — End: 2022-04-08
  Administered 2022-04-08 (×2): 50 ug via INTRAVENOUS

## 2022-04-08 MED ORDER — DEXAMETHASONE SODIUM PHOSPHATE 10 MG/ML IJ SOLN
INTRAMUSCULAR | Status: DC | PRN
  Administered 2022-04-08: 5 mg via INTRAVENOUS

## 2022-04-08 MED ORDER — FENTANYL CITRATE (PF) 250 MCG/5ML IJ SOLN
INTRAMUSCULAR | Status: AC
  Filled 2022-04-08: qty 5

## 2022-04-08 MED ORDER — SUGAMMADEX SODIUM 200 MG/2ML IV SOLN
INTRAVENOUS | Status: DC | PRN
  Administered 2022-04-08: 200 mg via INTRAVENOUS

## 2022-04-08 MED ORDER — PHENYLEPHRINE 80 MCG/ML (10ML) SYRINGE FOR IV PUSH (FOR BLOOD PRESSURE SUPPORT)
PREFILLED_SYRINGE | INTRAVENOUS | Status: AC
  Filled 2022-04-08: qty 10

## 2022-04-08 MED ORDER — FENTANYL CITRATE (PF) 100 MCG/2ML IJ SOLN
INTRAMUSCULAR | Status: AC
  Filled 2022-04-08: qty 2

## 2022-04-08 MED ORDER — ROCURONIUM BROMIDE 10 MG/ML (PF) SYRINGE
PREFILLED_SYRINGE | INTRAVENOUS | Status: DC | PRN
  Administered 2022-04-08: 50 mg via INTRAVENOUS

## 2022-04-08 MED ORDER — OXYCODONE HCL 5 MG PO TABS: 5.0000 mg | ORAL_TABLET | Freq: Once | ORAL | Status: DC | PRN

## 2022-04-08 MED ORDER — OXYCODONE HCL 5 MG/5ML PO SOLN: 5.0000 mg | Freq: Once | ORAL | Status: DC | PRN

## 2022-04-08 MED ORDER — VANCOMYCIN HCL 500 MG IV SOLR
INTRAVENOUS | Status: DC | PRN
  Administered 2022-04-08: 1000 mg

## 2022-04-08 MED ORDER — MIDAZOLAM HCL 2 MG/2ML IJ SOLN
INTRAMUSCULAR | Status: DC | PRN
  Administered 2022-04-08: 2 mg via INTRAVENOUS

## 2022-04-08 MED ORDER — CHLORHEXIDINE GLUCONATE 0.12 % MT SOLN
OROMUCOSAL | Status: AC
  Filled 2022-04-08: qty 15

## 2022-04-08 MED ORDER — SODIUM CHLORIDE 0.9 % IV SOLN
2.0000 g | INTRAVENOUS | Status: DC
  Administered 2022-04-08 – 2022-04-10 (×3): 2 g via INTRAVENOUS
  Filled 2022-04-08 (×4): qty 20

## 2022-04-08 MED ORDER — 0.9 % SODIUM CHLORIDE (POUR BTL) OPTIME
TOPICAL | Status: DC | PRN
  Administered 2022-04-08 (×2): 1000 mL

## 2022-04-08 MED ORDER — VANCOMYCIN HCL 1000 MG IV SOLR
INTRAVENOUS | Status: AC
  Filled 2022-04-08: qty 20

## 2022-04-08 MED ORDER — ONDANSETRON HCL 4 MG/2ML IJ SOLN: 4.0000 mg | Freq: Once | INTRAMUSCULAR | Status: DC | PRN

## 2022-04-08 MED ORDER — MIDAZOLAM HCL 2 MG/2ML IJ SOLN
INTRAMUSCULAR | Status: AC
  Filled 2022-04-08: qty 2

## 2022-04-08 MED ORDER — FENTANYL CITRATE (PF) 100 MCG/2ML IJ SOLN
25.0000 ug | INTRAMUSCULAR | Status: DC | PRN
  Administered 2022-04-08: 50 ug via INTRAVENOUS

## 2022-04-08 MED ORDER — PHENYLEPHRINE 80 MCG/ML (10ML) SYRINGE FOR IV PUSH (FOR BLOOD PRESSURE SUPPORT)
PREFILLED_SYRINGE | INTRAVENOUS | Status: DC | PRN
  Administered 2022-04-08: 160 ug via INTRAVENOUS
  Administered 2022-04-08: 80 ug via INTRAVENOUS

## 2022-04-08 MED ORDER — POTASSIUM CHLORIDE CRYS ER 20 MEQ PO TBCR
20.0000 meq | EXTENDED_RELEASE_TABLET | Freq: Two times a day (BID) | ORAL | Status: AC
  Administered 2022-04-08 (×2): 20 meq via ORAL
  Filled 2022-04-08 (×2): qty 1

## 2022-04-08 SURGICAL SUPPLY — 62 items
APL SKNCLS STERI-STRIP NONHPOA (GAUZE/BANDAGES/DRESSINGS)
BAG COUNTER SPONGE SURGICOUNT (BAG) ×3 IMPLANT
BAG SPNG CNTER NS LX DISP (BAG) ×1
BENZOIN TINCTURE PRP APPL 2/3 (GAUZE/BANDAGES/DRESSINGS) IMPLANT
BLADE CLIPPER SURG (BLADE) IMPLANT
BLADE SURG 11 STRL SS (BLADE) ×1 IMPLANT
CANISTER SUCT 3000ML PPV (MISCELLANEOUS) ×3 IMPLANT
CARTRIDGE OIL MAESTRO DRILL (MISCELLANEOUS) ×2 IMPLANT
DIFFUSER DRILL AIR PNEUMATIC (MISCELLANEOUS) ×3 IMPLANT
DRAPE INCISE IOBAN 66X45 STRL (DRAPES) ×1 IMPLANT
DRAPE LAPAROTOMY 100X72X124 (DRAPES) ×3 IMPLANT
DRAPE SURG 17X23 STRL (DRAPES) ×3 IMPLANT
DRSG VAC ATS LRG SENSATRAC (GAUZE/BANDAGES/DRESSINGS) ×2 IMPLANT
DURAPREP 26ML APPLICATOR (WOUND CARE) ×3 IMPLANT
ELECT REM PT RETURN 9FT ADLT (ELECTROSURGICAL) ×2
ELECTRODE REM PT RTRN 9FT ADLT (ELECTROSURGICAL) ×2 IMPLANT
GAUZE 4X4 16PLY ~~LOC~~+RFID DBL (SPONGE) IMPLANT
GAUZE SPONGE 4X4 12PLY STRL (GAUZE/BANDAGES/DRESSINGS) IMPLANT
GLOVE BIO SURGEON STRL SZ 6.5 (GLOVE) IMPLANT
GLOVE BIO SURGEON STRL SZ7 (GLOVE) IMPLANT
GLOVE BIO SURGEON STRL SZ7.5 (GLOVE) IMPLANT
GLOVE BIO SURGEON STRL SZ8 (GLOVE) IMPLANT
GLOVE BIO SURGEON STRL SZ8.5 (GLOVE) IMPLANT
GLOVE BIOGEL M 8.0 STRL (GLOVE) IMPLANT
GLOVE ECLIPSE 6.5 STRL STRAW (GLOVE) ×2 IMPLANT
GLOVE ECLIPSE 7.0 STRL STRAW (GLOVE) IMPLANT
GLOVE ECLIPSE 7.5 STRL STRAW (GLOVE) IMPLANT
GLOVE ECLIPSE 8.0 STRL XLNG CF (GLOVE) IMPLANT
GLOVE ECLIPSE 8.5 STRL (GLOVE) IMPLANT
GLOVE EXAM NITRILE XL STR (GLOVE) IMPLANT
GLOVE INDICATOR 6.5 STRL GRN (GLOVE) IMPLANT
GLOVE INDICATOR 7.0 STRL GRN (GLOVE) IMPLANT
GLOVE INDICATOR 7.5 STRL GRN (GLOVE) IMPLANT
GLOVE INDICATOR 8.0 STRL GRN (GLOVE) IMPLANT
GLOVE INDICATOR 8.5 STRL (GLOVE) IMPLANT
GLOVE OPTIFIT SS 8.0 STRL (GLOVE) IMPLANT
GLOVE SURG SS PI 6.5 STRL IVOR (GLOVE) ×2 IMPLANT
GLOVE SURG SS PI 7.0 STRL IVOR (GLOVE) ×2 IMPLANT
GOWN STRL REUS W/ TWL LRG LVL3 (GOWN DISPOSABLE) ×4 IMPLANT
GOWN STRL REUS W/ TWL XL LVL3 (GOWN DISPOSABLE) IMPLANT
GOWN STRL REUS W/TWL 2XL LVL3 (GOWN DISPOSABLE) IMPLANT
GOWN STRL REUS W/TWL LRG LVL3 (GOWN DISPOSABLE) ×4
GOWN STRL REUS W/TWL XL LVL3 (GOWN DISPOSABLE)
KIT BASIN OR (CUSTOM PROCEDURE TRAY) ×3 IMPLANT
KIT TURNOVER KIT B (KITS) ×3 IMPLANT
NS IRRIG 1000ML POUR BTL (IV SOLUTION) ×3 IMPLANT
OIL CARTRIDGE MAESTRO DRILL (MISCELLANEOUS) ×2
PACK LAMINECTOMY NEURO (CUSTOM PROCEDURE TRAY) ×3 IMPLANT
PAD ARMBOARD 7.5X6 YLW CONV (MISCELLANEOUS) ×9 IMPLANT
SPIKE FLUID TRANSFER (MISCELLANEOUS) ×3 IMPLANT
SPONGE SURGIFOAM ABS GEL SZ50 (HEMOSTASIS) ×3 IMPLANT
SPONGE T-LAP 4X18 ~~LOC~~+RFID (SPONGE) IMPLANT
STRIP CLOSURE SKIN 1/2X4 (GAUZE/BANDAGES/DRESSINGS) ×1 IMPLANT
SUT VIC AB 0 CT1 18XCR BRD8 (SUTURE) ×2 IMPLANT
SUT VIC AB 0 CT1 8-18 (SUTURE) ×2
SUT VIC AB 2-0 CT1 18 (SUTURE) ×3 IMPLANT
SUT VIC AB 3-0 SH 8-18 (SUTURE) ×3 IMPLANT
SWAB COLLECTION DEVICE MRSA (MISCELLANEOUS) IMPLANT
SWAB CULTURE ESWAB REG 1ML (MISCELLANEOUS) ×1 IMPLANT
TOWEL GREEN STERILE (TOWEL DISPOSABLE) ×3 IMPLANT
TOWEL GREEN STERILE FF (TOWEL DISPOSABLE) ×3 IMPLANT
WATER STERILE IRR 1000ML POUR (IV SOLUTION) ×3 IMPLANT

## 2022-04-08 NOTE — Progress Notes (Signed)
Patient ID: [name], female   DOB: 1961-07-25, 61 y.o.   MRN: [id number] BP (!) 143/79 (BP Location: Left Arm)   Pulse 82   Temp 97.6 F (36.4 C) (Oral)   Resp (!) 23   Ht 5\' 3"  (1.6 m)   Wt 93.9 kg   LMP 11/20/2012   SpO2 95%   BMI 36.67 kg/m  Alert and oriented x 4, speech is clear and fluent Afebrile Reported increasing drainage from incision. Will explore given the possibility of a wound infection. White count has decreased since its high of 26.9 UTI identified as ecoli OR for thoracolumbar wound exploration

## 2022-04-08 NOTE — Plan of Care (Signed)

## 2022-04-08 NOTE — Anesthesia Postprocedure Evaluation (Signed)
Anesthesia Post Note  Patient: [name]  Procedure(s) Performed: LUMBAR WOUND DEBRIDEMENT (Back)     Patient location during evaluation: PACU Anesthesia Type: General Level of consciousness: awake and alert Pain management: pain level controlled Vital Signs Assessment: post-procedure vital signs reviewed and stable Respiratory status: spontaneous breathing, nonlabored ventilation and respiratory function stable Cardiovascular status: stable and blood pressure returned to baseline Anesthetic complications: no   No notable events documented.  Last Vitals:  Vitals:   04/08/22 2140 04/08/22 2200  BP: 138/70 (!) 155/81  Pulse: 75 71  Resp: 19 16  Temp: 36.4 C   SpO2: 96% 98%    Last Pain:  Vitals:   04/08/22 2200  TempSrc:   PainSc: 6                  Beryle Lathe

## 2022-04-08 NOTE — Transfer of Care (Signed)
Immediate Anesthesia Transfer of Care Note  Patient: [name]  Procedure(s) Performed: LUMBAR WOUND DEBRIDEMENT (Back)  Patient Location: PACU  Anesthesia Type:General  Level of Consciousness: awake, alert  and oriented  Airway & Oxygen Therapy: Patient Spontanous Breathing and Patient connected to face mask oxygen  Post-op Assessment: Report given to RN and Post -op Vital signs reviewed and stable  Post vital signs: Reviewed and stable  Last Vitals:  Vitals Value Taken Time  BP 125/98 04/08/22 2110  Temp    Pulse 68 04/08/22 2115  Resp 16 04/08/22 2115  SpO2 97 % 04/08/22 2115  Vitals shown include unvalidated device data.  Last Pain:  Vitals:   04/08/22 1520  TempSrc:   PainSc: 0-No pain      Patients Stated Pain Goal: 0 (04/06/22 1334)  Complications: No notable events documented.

## 2022-04-08 NOTE — Op Note (Signed)
04/08/2022  9:47 PM  PATIENT:  [name]  61 y.o. female  PRE-OPERATIVE DIAGNOSIS:  Thoracolumbar Infection  POST-OPERATIVE DIAGNOSIS:  Thoracolumbar Infection  PROCEDURE:  Procedure(s): LUMBAR WOUND DEBRIDEMENT  SURGEON: Surgeon(s): Coletta Memos, MD  ASSISTANTS:none  ANESTHESIA:   general  EBL:  Total I/O In: 600 [I.V.:500; IV Piggyback:100] Out: 405 [Urine:400; Blood:5]  BLOOD ADMINISTERED:none  CELL SAVER GIVEN:none, nor was it used  COUNT:per nursing  DRAINS: none   SPECIMEN:  Source of Specimen:  wound  DICTATION: [name] was taken to the operating room, intubated, and placed under a general anesthetic without difficulty. She was positioned prone on a Wilson frame with all pressure points properly padded. Her incision was prepped and draped in a sterile manner.  Purulent material was draining from the wound prior to suture removal. I removed the sutures, the wound was dehisced, and not healed. I opened the wound and found gross pus through out the length and depth of the incision. No CSF leak was appreciated. I irrigated 3 liters of saline into the wound. I did some debridement. I then placed two large sponges on either side of the wound. I placed a tagaderm dressing over the sponges.  She was rolled supine onto a stretcher and extubated. Cultures were sent from the wound.  PLAN OF CARE: Admit to inpatient   PATIENT DISPOSITION:  PACU - hemodynamically stable.   Delay start of Pharmacological VTE agent (>24hrs) due to surgical blood loss or risk of bleeding:  no

## 2022-04-08 NOTE — Progress Notes (Addendum)
Received call from preop to see if pt was ready for surgery. We have nor orders for consent or surgery. CHG bath given. Pt went to OR via bed with OR transport.

## 2022-04-08 NOTE — Anesthesia Preprocedure Evaluation (Addendum)
Anesthesia Evaluation  Patient identified by MRN, date of birth, ID band Patient awake    Reviewed: Allergy & Precautions, NPO status , Patient's Chart, lab work & pertinent test results, reviewed documented beta blocker date and time   Airway Mallampati: I  TM Distance: >3 FB Neck ROM: Full    Dental  (+) Edentulous Lower, Edentulous Upper   Pulmonary asthma , COPD,  COPD inhaler, former smoker,    Pulmonary exam normal        Cardiovascular hypertension, Pt. on home beta blockers and Pt. on medications Normal cardiovascular exam     Neuro/Psych  Headaches, PSYCHIATRIC DISORDERS Anxiety Depression  Insomnia   Neuromuscular disease    GI/Hepatic Neg liver ROS, hiatal hernia, GERD  Medicated, IBS   Endo/Other   Obesity   Renal/GU negative Renal ROS     Musculoskeletal  (+) Arthritis , Osteoarthritis,  Fibromyalgia -  Abdominal   Peds  Hematology  (+) Blood dyscrasia, anemia ,   Anesthesia Other Findings   Reproductive/Obstetrics                           Anesthesia Physical  Anesthesia Plan  ASA: 3  Anesthesia Plan: General   Post-op Pain Management: Tylenol PO (pre-op)*, Gabapentin PO (pre-op)* and Celebrex PO (pre-op)*   Induction: Intravenous  PONV Risk Score and Plan: 3 and Dexamethasone, Ondansetron, Midazolam and Treatment may vary due to age or medical condition  Airway Management Planned: Oral ETT  Additional Equipment: None  Intra-op Plan:   Post-operative Plan: Extubation in OR  Informed Consent: I have reviewed the patients History and Physical, chart, labs and discussed the procedure including the risks, benefits and alternatives for the proposed anesthesia with the patient or authorized representative who has indicated his/her understanding and acceptance.     Dental advisory given  Plan Discussed with: CRNA and Anesthesiologist  Anesthesia Plan Comments:        Anesthesia Quick Evaluation

## 2022-04-08 NOTE — Anesthesia Procedure Notes (Signed)
Procedure Name: Intubation Date/Time: 04/08/2022 7:49 PM  Performed by: Mayer Camel, CRNAPre-anesthesia Checklist: Patient identified, Emergency Drugs available, Suction available and Patient being monitored Patient Re-evaluated:Patient Re-evaluated prior to induction Oxygen Delivery Method: Circle System Utilized Preoxygenation: Pre-oxygenation with 100% oxygen Induction Type: IV induction Ventilation: Mask ventilation without difficulty Laryngoscope Size: Miller and 2 Grade View: Grade I Tube type: Oral Tube size: 7.0 mm Number of attempts: 1 Airway Equipment and Method: Stylet and Oral airway Placement Confirmation: ETT inserted through vocal cords under direct vision, positive ETCO2 and breath sounds checked- equal and bilateral Secured at: 21 cm Tube secured with: Tape Dental Injury: Teeth and Oropharynx as per pre-operative assessment

## 2022-04-08 NOTE — Progress Notes (Signed)
Occupational Therapy Treatment Patient Details Name: [name] MRN: [id number] DOB: 02/13/1961 Today's Date: 04/08/2022   History of present illness [name] is a 61 y.o. female who underwent thoracic ten to Lumbar three Posterior lateral arthrodesis with screws 03/22/22. S/p thoracic wound exploration with repair of CSF leak 6/24. PMH: anemia, anxiety, fibromyalgia, GERD, HTN, hyperthyroidism, IBS, ACDF 3/4   OT comments  [name] is making incremental progress towards her goals with notable improvements in transfers this session. Overall she required min G for sit<>stand and functional ambulation, however was limited by activity tolerance. Pt self limited this date due to not having food, but agreeable to try again once she has a meal. OT to to continue to follow. D/c remains appropriate, however if she is able to reach a supervision- mod I level acutely, she will be safe to d/c home.    Recommendations for follow up therapy are one component of a multi-disciplinary discharge planning process, led by the attending physician.  Recommendations may be updated based on patient status, additional functional criteria and insurance authorization.    Follow Up Recommendations  Skilled nursing-short term rehab (<3 hours/day) (pt currently declining SNF despite education and insight into deficits, per chart family has stated pt does not have great home support. If pt d/c's home, she will need HHOT and max HH services.)    Assistance Recommended at Discharge Frequent or constant Supervision/Assistance  Patient can return home with the following  A lot of help with walking and/or transfers;A lot of help with bathing/dressing/bathroom;Assistance with cooking/housework;Assist for transportation;Help with stairs or ramp for entrance   Equipment Recommendations  None recommended by OT       Precautions / Restrictions Precautions Precautions: Fall;Back Precaution Booklet Issued: Yes  (comment) Precaution Comments: Verbally reviewed, recalls 2/3, needs reminders for compliance Restrictions Weight Bearing Restrictions: No Other Position/Activity Restrictions: no brace needed       Mobility Bed Mobility Overal bed mobility: Needs Assistance Bed Mobility: Rolling, Sit to Sidelying Rolling: Supervision       Sit to sidelying: Mod assist      Transfers Overall transfer level: Needs assistance Equipment used: Rolling walker (2 wheels) Transfers: Sit to/from Stand, Bed to chair/wheelchair/BSC Sit to Stand: Min guard     Step pivot transfers: Min guard     General transfer comment: no physical assists needed - poor hand placement, pt ignoring cues     Balance Overall balance assessment: Needs assistance Sitting-balance support: Feet supported, No upper extremity supported Sitting balance-[name]: Fair     Standing balance support: During functional activity, Bilateral upper extremity supported, Reliant on assistive device for balance Standing balance-[name]: Poor             ADL either performed or assessed with clinical judgement   ADL Overall ADL's : Needs assistance/impaired             Toilet Transfer: Min guard;Ambulation           Functional mobility during ADLs: Min guard;Rolling walker (2 wheels)      Extremity/Trunk Assessment Upper Extremity Assessment Upper Extremity Assessment: RUE deficits/detail;LUE deficits/detail RUE Deficits / Details: hx of neuropathy, ROM and MMT overall WFL. coordination and dexterity are mildly limited LUE Deficits / Details: hx of neuropathy, ROM and MMT overall WFL. coordination and dexterity are mildly limited   Lower Extremity Assessment Lower Extremity Assessment: Defer to PT evaluation        Vision   Vision Assessment?: No apparent visual  deficits   Perception Perception Perception: Not tested   Praxis Praxis Praxis: Not tested    Cognition Arousal/Alertness:  Awake/alert Behavior During Therapy: WFL for tasks assessed/performed Overall Cognitive Status: Impaired/Different from baseline Area of Impairment: Memory, Attention, Problem solving                   Current Attention Level: Selective Memory: Decreased short-term memory, Decreased recall of precautions       Problem Solving: Slow processing, Requires verbal cues, Difficulty sequencing General Comments: WFL with better attention this session - non distracting environment with TV off and blinds closed              General Comments VSS on RA    Pertinent Vitals/ Pain       Pain Assessment Pain Assessment: Faces Faces Pain Scale: Hurts a little bit Pain Location: back Pain Descriptors / Indicators: Discomfort, Grimacing, Operative site guarding Pain Intervention(s): Limited activity within patient's tolerance   Frequency  Min 2X/week        Progress Toward Goals  OT Goals(current goals can now be found in the care plan section)  Progress towards OT goals: Progressing toward goals  Acute Rehab OT Goals Patient Stated Goal: to go home OT Goal Formulation: With patient Time For Goal Achievement: 04/17/22 Potential to Achieve Goals: Good ADL Goals Pt Will Perform Lower Body Bathing: with modified independence;sit to/from stand;with adaptive equipment Pt Will Perform Lower Body Dressing: with modified independence;sit to/from stand;with adaptive equipment Pt Will Transfer to Toilet: with modified independence;ambulating Additional ADL Goal #1: Pt will indep complete bed mobility with log roll technique as a precursor to ADLs  Plan Discharge plan needs to be updated       AM-PAC OT "6 Clicks" Daily Activity     Outcome Measure   Help from another person eating meals?: None Help from another person taking care of personal grooming?: A Little Help from another person toileting, which includes using toliet, bedpan, or urinal?: A Lot Help from another person  bathing (including washing, rinsing, drying)?: A Lot Help from another person to put on and taking off regular upper body clothing?: A Little Help from another person to put on and taking off regular lower body clothing?: A Lot 6 Click Score: 16    End of Session Equipment Utilized During Treatment: Gait belt;Rolling walker (2 wheels)  OT Visit Diagnosis: Unsteadiness on feet (R26.81);Muscle weakness (generalized) (M62.81);Pain   Activity Tolerance Patient tolerated treatment well   Patient Left in bed;with call bell/phone within reach   Nurse Communication Mobility status        Time: 1829-9371 OT Time Calculation (min): 17 min  Charges: OT General Charges $OT Visit: 1 Visit OT Treatments $Self Care/Home Management : 8-22 mins    Fernie Grimm A Gwenette Wellons 04/08/2022, 5:37 PM

## 2022-04-08 NOTE — Progress Notes (Signed)
Physical Therapy Treatment Patient Details Name: [name] MRN: [id number] DOB: 10-20-1960 Today's Date: 04/08/2022   History of Present Illness [name] is a 61 y.o. female who underwent thoracic ten to Lumbar three Posterior lateral arthrodesis with screws 03/22/22. S/p thoracic wound exploration with repair of CSF leak 6/24. PMH: anemia, anxiety, fibromyalgia, GERD, HTN, hyperthyroidism, IBS, ACDF 3/4    PT Comments    Patient able to complete toileting and ambulate in hallway extended distance and even performing transfers eaiser.  Still somewhat oblivious to safety concerns and up in room tangled in wires upon my entry.  However, she is too high level for SNF and too determined to be independent.  Continue to feel she is most appropriate for HHPT at d/c.  Would prefer initial 24/7 assist for safety.   Recommendations for follow up therapy are one component of a multi-disciplinary discharge planning process, led by the attending physician.  Recommendations may be updated based on patient status, additional functional criteria and insurance authorization.  Follow Up Recommendations  Home health PT     Assistance Recommended at Discharge Intermittent Supervision/Assistance  Patient can return home with the following A little help with bathing/dressing/bathroom;Help with stairs or ramp for entrance;Assist for transportation;Assistance with cooking/housework;A little help with walking and/or transfers   Equipment Recommendations  None recommended by PT    Recommendations for Other Services       Precautions / Restrictions Precautions Precautions: Fall;Back Precaution Booklet Issued: Yes (comment) Precaution Comments: Verbally reviewed, recalls 2/3, needs reminders for compliance Restrictions Weight Bearing Restrictions: No Other Position/Activity Restrictions: no brace needed     Mobility  Bed Mobility               General bed mobility comments: up in chair     Transfers   Equipment used: Rolling walker (2 wheels) Transfers: Sit to/from Stand Sit to Stand: Supervision, Min assist           General transfer comment: increased time, efforful with UE support on armrests,  Stand in bathroom with regular toilet and grabbars with min A    Ambulation/Gait Ambulation/Gait assistance: Supervision Gait Distance (Feet): 500 Feet Assistive device: Rolling walker (2 wheels) Gait Pattern/deviations: Step-through pattern, Decreased stride length       General Gait Details: slow pace, decreased foot clearance, mildly flexed at hips, talking and walking throughout no issues with vitals on RA   Stairs             Wheelchair Mobility    Modified Rankin (Stroke Patients Only)       Balance Overall balance assessment: Needs assistance   Sitting balance-[name]: Good     Standing balance support: No upper extremity supported, During functional activity Standing balance-[name]: Fair Standing balance comment: standing to complete toilet hygiene and wash hands, no LOB                            Cognition Arousal/Alertness: Awake/alert Behavior During Therapy: WFL for tasks assessed/performed Overall Cognitive Status: Within Functional Limits for tasks assessed                       Memory: Decreased recall of precautions                  Exercises      General Comments General comments (skin integrity, edema, etc.): VSS on RA  Pertinent Vitals/Pain Pain Assessment Faces Pain Scale: Hurts little more Pain Location: back Pain Descriptors / Indicators: Discomfort, Grimacing, Operative site guarding Pain Intervention(s): Monitored during session, Patient requesting pain meds-RN notified    Home Living                          Prior Function            PT Goals (current goals can now be found in the care plan section) Acute Rehab PT Goals Patient Stated Goal: home PT  Goal Formulation: With patient Time For Goal Achievement: 04/22/22 Potential to Achieve Goals: Good Progress towards PT goals: Progressing toward goals    Frequency    Min 5X/week      PT Plan Current plan remains appropriate    Co-evaluation              AM-PAC PT "6 Clicks" Mobility   Outcome Measure  Help needed turning from your back to your side while in a flat bed without using bedrails?: A Little Help needed moving from lying on your back to sitting on the side of a flat bed without using bedrails?: A Little Help needed moving to and from a bed to a chair (including a wheelchair)?: A Little Help needed standing up from a chair using your arms (e.g., wheelchair or bedside chair)?: A Little Help needed to walk in hospital room?: A Little Help needed climbing 3-5 steps with a railing? : Total 6 Click Score: 16    End of Session   Activity Tolerance: Patient tolerated treatment well Patient left: in chair;with call bell/phone within reach;with nursing/sitter in room   PT Visit Diagnosis: Difficulty in walking, not elsewhere classified (R26.2);Muscle weakness (generalized) (M62.81);Unsteadiness on feet (R26.81);Other abnormalities of gait and mobility (R26.89)     Time: 0254-2706 PT Time Calculation (min) (ACUTE ONLY): 49 min  Charges:  $Gait Training: 8-22 mins $Therapeutic Activity: 8-22 mins $Self Care/Home Management: 8-22                     [name], PT Acute Rehabilitation Services Office:[phone] 04/08/2022    [name] 04/08/2022, 5:39 PM

## 2022-04-09 ENCOUNTER — Encounter (HOSPITAL_COMMUNITY): Payer: Self-pay | Admitting: Neurosurgery

## 2022-04-09 LAB — BASIC METABOLIC PANEL
Anion gap: 16 — ABNORMAL HIGH (ref 5–15)
BUN: 21 mg/dL — ABNORMAL HIGH (ref 6–20)
CO2: 27 mmol/L (ref 22–32)
Calcium: 9.2 mg/dL (ref 8.9–10.3)
Chloride: 97 mmol/L — ABNORMAL LOW (ref 98–111)
Creatinine, Ser: 0.68 mg/dL (ref 0.44–1.00)
GFR, Estimated: >60 mL/min (ref >60)
Glucose, Bld: 135 mg/dL — ABNORMAL HIGH (ref 70–99)
Potassium: 3.4 mmol/L — ABNORMAL LOW (ref 3.5–5.1)
Sodium: 140 mmol/L (ref 135–145)

## 2022-04-09 NOTE — Progress Notes (Signed)
  NEUROSURGERY PROGRESS NOTE   No issues overnight. Minimal pain this am.  EXAM:  BP (!) 162/76 (BP Location: Right Arm)   Pulse 72   Temp 97.6 F (36.4 C) (Oral)   Resp 20   Ht 5\' 3"  (1.6 m)   Wt 93.9 kg   LMP 11/20/2012   SpO2 100%   BMI 36.67 kg/m   Awake, alert, oriented  Speech fluent, appropriate  CN grossly intact  5/5 BLE Wound open with sponge packed inferiorly and vac foam within the primary portion of wound.   IMPRESSION:  61 y.o. female POD#1 wound debridement after previous thoracolumbar fusion. Clinically stable  PLAN: - Dressing changed at bedside today, foam and sponge left in place today - Cont IV Abx - Await OR cultures, GS (+) for gram neg rods c/w previous E. Coli   46, MD [facility]

## 2022-04-09 NOTE — Plan of Care (Signed)

## 2022-04-09 NOTE — Progress Notes (Signed)
Physical Therapy Treatment Patient Details Name: [name] MRN: [id number] DOB: 02-09-1961 Today's Date: 04/09/2022   History of Present Illness [name] is a 61 y.o. female who underwent thoracic ten to Lumbar three Posterior lateral arthrodesis with screws 03/22/22. S/p thoracic wound exploration with repair of CSF leak 6/24.  S/p lumbar wound debridement on 04/08/22 due to infection.  PMH: anemia, anxiety, fibromyalgia, GERD, HTN, hyperthyroidism, IBS, ACDF 3/4    PT Comments    Patient presents post above procedure still able to mobilize mostly on her own.  Still needing reminders during functional tasks for precautions.  Discussed if needing IV antibiotics if may need SNF, but she declined stating had a agency last time that helped when [facility] not coming and she did it herself in the evening.  Feel she may need higher level of care pending treatment plan.  PT to follow. Cy    Recommendations for follow up therapy are one component of a multi-disciplinary discharge planning process, led by the attending physician.  Recommendations may be updated based on patient status, additional functional criteria and insurance authorization.  Follow Up Recommendations  Home health PT     Assistance Recommended at Discharge    Patient can return home with the following A little help with bathing/dressing/bathroom;Help with stairs or ramp for entrance;Assist for transportation;Assistance with cooking/housework;A little help with walking and/or transfers   Equipment Recommendations  None recommended by PT    Recommendations for Other Services       Precautions / Restrictions Precautions Precautions: Fall;Back Precaution Comments: reminders during functional tasks for precautions Restrictions Other Position/Activity Restrictions: no brace needed     Mobility  Bed Mobility   Bed Mobility: Rolling, Sidelying to Sit Rolling: Supervision Sidelying to sit: Supervision       General  bed mobility comments: cues for technique and increased time    Transfers Overall transfer level: Needs assistance Equipment used: Rolling walker (2 wheels) Transfers: Sit to/from Stand Sit to Stand: Min guard, Supervision, From elevated surface           General transfer comment: pulls up on walker or sink in bathroom    Ambulation/Gait Ambulation/Gait assistance: Min guard Gait Distance (Feet): 150 Feet Assistive device: Rolling walker (2 wheels) Gait Pattern/deviations: Step-through pattern, Decreased stride length, Trunk flexed, Wide base of support       General Gait Details: initially to bathroom for washing up, then in hallway, occasional minguard for balance   Stairs             Wheelchair Mobility    Modified Rankin (Stroke Patients Only)       Balance Overall balance assessment: Needs assistance   Sitting balance-[name]: Good       Standing balance-[name]: Fair Standing balance comment: assist for perineal washing in standing due to post surgical again                            Cognition Arousal/Alertness: Awake/alert Behavior During Therapy: [facility] for tasks assessed/performed Overall Cognitive Status: Within Functional Limits for tasks assessed                       Memory: Decreased recall of precautions         General Comments: needs reminders for precautions, but no different than previous        Exercises      General Comments General comments (skin  integrity, edema, etc.): large dressing on back with sponge and some serousanginous on outside of dressing so reinforced wtih another ABD and tape; NT in the room assisting pt with bath, pt needing reminders not to wash below knees unless has a long handle sponge      Pertinent Vitals/Pain Pain Assessment Pain Assessment: Faces Faces Pain Scale: Hurts whole lot Pain Location: back Pain Descriptors / Indicators: Discomfort, Aching Pain  Intervention(s): Monitored during session, Premedicated before session    Home Living                          Prior Function            PT Goals (current goals can now be found in the care plan section) Progress towards PT goals: Progressing toward goals    Frequency    Min 5X/week      PT Plan Current plan remains appropriate    Co-evaluation              AM-PAC PT "6 Clicks" Mobility   Outcome Measure  Help needed turning from your back to your side while in a flat bed without using bedrails?: A Little Help needed moving from lying on your back to sitting on the side of a flat bed without using bedrails?: A Little Help needed moving to and from a bed to a chair (including a wheelchair)?: A Little Help needed standing up from a chair using your arms (e.g., wheelchair or bedside chair)?: A Little Help needed to walk in hospital room?: A Little Help needed climbing 3-5 steps with a railing? : Total 6 Click Score: 16    End of Session   Activity Tolerance: Patient tolerated treatment well Patient left: in chair;with call bell/phone within reach   PT Visit Diagnosis: Difficulty in walking, not elsewhere classified (R26.2);Muscle weakness (generalized) (M62.81);Unsteadiness on feet (R26.81);Other abnormalities of gait and mobility (R26.89)     Time: 3299-2426 PT Time Calculation (min) (ACUTE ONLY): 34 min  Charges:  $Gait Training: 8-22 mins $Therapeutic Activity: 8-22 mins                     [name], PT Acute Rehabilitation Services [phone] 04/09/2022    [name] 04/09/2022, 5:11 PM

## 2022-04-10 LAB — CULTURE, BLOOD (ROUTINE X 2)
Culture: NO GROWTH
Culture: NO GROWTH
Special Requests: ADEQUATE
Special Requests: ADEQUATE

## 2022-04-10 MED ORDER — MORPHINE SULFATE (PF) 4 MG/ML IV SOLN
4.0000 mg | Freq: Once | INTRAVENOUS | Status: AC
  Administered 2022-04-10: 4 mg via INTRAVENOUS
  Filled 2022-04-10: qty 1

## 2022-04-10 MED ORDER — POTASSIUM CHLORIDE CRYS ER 20 MEQ PO TBCR
40.0000 meq | EXTENDED_RELEASE_TABLET | Freq: Once | ORAL | Status: AC
Start: 2022-04-10 — End: 2022-04-10
  Administered 2022-04-10: 40 meq via ORAL
  Filled 2022-04-10: qty 2

## 2022-04-10 MED ORDER — MIDAZOLAM HCL 2 MG/2ML IJ SOLN
INTRAMUSCULAR | Status: AC
  Filled 2022-04-10: qty 2

## 2022-04-10 MED ORDER — MIDAZOLAM HCL 2 MG/2ML IJ SOLN
2.0000 mg | Freq: Once | INTRAMUSCULAR | Status: AC
Start: 2022-04-10 — End: 2022-04-10
  Administered 2022-04-10: 2 mg via INTRAVENOUS
  Filled 2022-04-10: qty 2

## 2022-04-10 NOTE — TOC Progression Note (Signed)
Transition of Care [facility]) - Progression Note    Patient Details  Name: [name] MRN: [id number] Date of Birth: [birth year]  Transition of Care [facility]) CM/SW Contact  Eduard Roux, [name] Phone Number: 04/10/2022, 10:37 AM  Clinical Narrative:     TOC will continue to follow and assist with discharge needs.  Expected Discharge Plan: Home w Home Health Services Barriers to Discharge: Continued Medical Work up  Expected Discharge Plan and Services Expected Discharge Plan: Home w Home Health Services In-house Referral: Clinical Social Work Discharge Planning Services: CM Consult Post Acute Care Choice: Home Health Living arrangements for the past 2 months: Single Family Home                           HH Arranged: PT, OT, Social Work, Disease Management HH Agency: [facility] Date HH Agency Contacted: 04/04/22 Time HH Agency Contacted: 1245 Representative spoke with at [facility] Agency: Morrie Sheldon   Social Determinants of Health (SDOH) Interventions    Readmission Risk Interventions     No data to display

## 2022-04-10 NOTE — Progress Notes (Signed)
Patient ID: [name], female   DOB: 1960/11/29, 61 y.o.   MRN: [id number] BP 131/77 (BP Location: Right Arm)   Pulse 70   Temp 98 F (36.7 C) (Oral)   Resp 16   Ht 5\' 3"  (1.6 m)   Wt 93.9 kg   LMP 11/20/2012   SpO2 98%   BMI 36.67 kg/m  Alert and oriented x 4, speech is clear and fluent Moving all extremities well Wound dressing changed, bed looks good Gram negative rods located in the wound. Ecoli found on urine culture Await sensitivities

## 2022-04-10 NOTE — Op Note (Signed)
03/22/2022  12:50 PM  PATIENT:  [name]  61 y.o. female Presents with degenerative scoliosis, lateral spondylolisthesis, and lumbar stenosis with neurogenic claudication PRE-OPERATIVE DIAGNOSIS:  Spinal stenosis, Lumbar region with neurogenic claudication L1 L2  POST-OPERATIVE DIAGNOSIS:  Spinal stenosis, Lumbar region with neurogenic claudication L1/2  PROCEDURE:  Procedure(s): Thoracic ten to Lumbar three Posterior lateral arthrodesis   SURGEON:  Surgeon(s): Coletta Memos, MD Barnett Abu, MD  ASSISTANTS:elsner,  henry  ANESTHESIA:   general  EBL:  Total I/O In: 400 [P.O.:400] Out: 400 [Urine:400]  BLOOD ADMINISTERED:none  CELL SAVER GIVEN:none  COUNT:per nursing  DRAINS: none   SPECIMEN:  No Specimen  DICTATION: [name] is a 61 y.o. female whom was taken to the operating room intubated, and placed under a general anesthetic without difficulty. A foley catheter was placed under sterile conditions. She was positioned prone on a Jackson table with all pressure points properly padded.  Her thoraco lumbar region was prepped and draped in a sterile manner. I infiltrated 10cc's 1/2%lidocaine/1:2000,000 strength epinephrine into the planned incision. I opened the skin with a 10 blade and took the incision down to the thoracolumbar fascia. I exposed the lamina of T9-L3 in a subperiosteal fashion bilaterally. I confirmed my location with an intraoperative xray.  I placed self retaining retractors and started the decompression. We initially exposed the hardware that was in place then removed the locking caps and rods. I replaced the screws at T3 bilaterally with 6.5 mm screws I decompressed the spinal canal at L1/2 via a laminectomy and inferior L1 facetectomies.  I decorticated the lateral bone at T10-L2. I then placed allograft morsels on the decorticated surfaces to complete the posterolateral arthrodesis.  I  placed pedicle screws at T10,11,12,L1,L2, and L3 using  fluoroscopic guidance. I drilled a pilot hole, then cannulated the pedicle with a drill at each site.  While freeing the pedicle entry area at T10 on the left side I encountered spinal fluid where I had breach scar tissue. I could not primarily close this as there was no elasticity to the scar tissue.I then tapped each pedicle, assessing each site for pedicle violations. No cutouts were appreciated. Screws (Nuvasive mas plif) were then placed at each site without difficulty using . I attached rods and locking caps with the appropriate tools. The locking caps were secured with torque limited screwdrivers. Final films were performed and the final construct appeared to be in good position.  I closed the wound in a layered fashion. I approximated the thoracolumbar fascia, subcutaneous, and subcuticular planes with vicryl sutures. I closed the skin with nylon sutures. I used an occlusive bandage for a sterile dressing.     PLAN OF CARE: Admit to inpatient   PATIENT DISPOSITION:  PACU - hemodynamically stable.   Delay start of Pharmacological VTE agent (>24hrs) due to surgical blood loss or risk of bleeding:  yes

## 2022-04-10 NOTE — Progress Notes (Signed)
Occupational Therapy Treatment Patient Details Name: [name] MRN: [id number] DOB: 23-Apr-1961 Today's Date: 04/10/2022   History of present illness [name] is a 61 y.o. female who underwent thoracic ten to Lumbar three Posterior lateral arthrodesis with screws 03/22/22. S/p thoracic wound exploration with repair of CSF leak 6/24.  S/p lumbar wound debridement on 04/08/22 due to infection.  PMH: anemia, anxiety, fibromyalgia, GERD, HTN, hyperthyroidism, IBS, ACDF 3/4   OT comments  [name] is progressing incrementally, she remains limited by general weakness, decreased activity tolerance, adherence to her back precautions and pain. Pt completed toilet, hygiene, grooming and LB bathing at the sink with up to min A for balance and cues. However, despite max cuing and visual demonstration she continue to break spinal precautions. She continues to benefit acutely, d/c to home is appropriate if pt can have HHOT and increased assistance from her family.    Recommendations for follow up therapy are one component of a multi-disciplinary discharge planning process, led by the attending physician.  Recommendations may be updated based on patient status, additional functional criteria and insurance authorization.    Follow Up Recommendations  Home health OT    Assistance Recommended at Discharge Frequent or constant Supervision/Assistance  Patient can return home with the following  A lot of help with walking and/or transfers;A lot of help with bathing/dressing/bathroom;Assistance with cooking/housework;Assist for transportation;Help with stairs or ramp for entrance   Equipment Recommendations  None recommended by OT       Precautions / Restrictions Precautions Precautions: Back;Fall Precaution Booklet Issued: Yes (comment) Precaution Comments: requires cues to maintain Required Braces or Orthoses:  (no brace needed) Restrictions Weight Bearing Restrictions: No Other Position/Activity  Restrictions: no brace needed       Mobility Bed Mobility Overal bed mobility: Needs Assistance Bed Mobility: Rolling, Sit to Sidelying Rolling: Supervision       Sit to sidelying: Max assist General bed mobility comments: assist for BLEs    Transfers Overall transfer level: Needs assistance Equipment used: Rolling walker (2 wheels) Transfers: Sit to/from Stand Sit to Stand: Supervision                 Balance Overall balance assessment: Needs assistance Sitting-balance support: Feet supported, No upper extremity supported Sitting balance-[name]: Good     Standing balance support: During functional activity, Bilateral upper extremity supported, No upper extremity supported Standing balance-[name]: Fair                             ADL either performed or assessed with clinical judgement   ADL Overall ADL's : Needs assistance/impaired     Grooming: Min guard;Standing Grooming Details (indicate cue type and reason): at the sink     Lower Body Bathing: Minimal assistance Lower Body Bathing Details (indicate cue type and reason): standing at the sink pt completed pericare         Toilet Transfer: Min guard;Ambulation Toilet Transfer Details (indicate cue type and reason): pt broke spinal precautions despite max cues Toileting- Clothing Manipulation and Hygiene: Min guard;Sitting/lateral lean       Functional mobility during ADLs: Min guard General ADL Comments: continues to need increased time and cues for back precautions    Extremity/Trunk Assessment Upper Extremity Assessment Upper Extremity Assessment: RUE deficits/detail;LUE deficits/detail RUE Deficits / Details: hx of neuropathy, ROM and MMT overall WFL. coordination and dexterity are mildly limited RUE Sensation: decreased light touch;history of peripheral neuropathy  RUE Coordination: decreased fine motor LUE Deficits / Details: hx of neuropathy, ROM and MMT overall WFL.  coordination and dexterity are mildly limited LUE Sensation: decreased light touch;history of peripheral neuropathy LUE Coordination: decreased fine motor   Lower Extremity Assessment Lower Extremity Assessment: Defer to PT evaluation        Vision   Vision Assessment?: No apparent visual deficits   Perception Perception Perception: Within Functional Limits   Praxis Praxis Praxis: Intact    Cognition Arousal/Alertness: Awake/alert Behavior During Therapy: WFL for tasks assessed/performed Overall Cognitive Status: No family/caregiver present to determine baseline cognitive functioning Area of Impairment: Memory, Attention, Problem solving, Safety/judgement                   Current Attention Level: Selective Memory: Decreased recall of precautions   Safety/Judgement: Decreased awareness of safety   Problem Solving: Slow processing, Requires verbal cues General Comments: Requires cues to maintain back precautions during ADLs. Twisted and pulled herself up from the toilet despite max cues. Perseverates on unrelated topics. self-distracting. limited insight into safety and deficits.              General Comments VSS on RA, large dressing intact    Pertinent Vitals/ Pain       Pain Assessment Pain Assessment: Faces Faces Pain Scale: Hurts a little bit Pain Location: back Pain Descriptors / Indicators: Discomfort, Grimacing, Operative site guarding Pain Intervention(s): Limited activity within patient's tolerance, Monitored during session   Frequency  Min 2X/week        Progress Toward Goals  OT Goals(current goals can now be found in the care plan section)  Progress towards OT goals: Progressing toward goals  Acute Rehab OT Goals Patient Stated Goal: to go home OT Goal Formulation: With patient Time For Goal Achievement: 04/17/22 Potential to Achieve Goals: Good ADL Goals Pt Will Perform Lower Body Bathing: with modified independence;sit to/from  stand;with adaptive equipment Pt Will Perform Lower Body Dressing: with modified independence;sit to/from stand;with adaptive equipment Pt Will Transfer to Toilet: with modified independence;ambulating Additional ADL Goal #1: Pt will indep complete bed mobility with log roll technique as a precursor to ADLs  Plan Frequency needs to be updated       AM-PAC OT "6 Clicks" Daily Activity     Outcome Measure   Help from another person eating meals?: None Help from another person taking care of personal grooming?: A Little Help from another person toileting, which includes using toliet, bedpan, or urinal?: A Little Help from another person bathing (including washing, rinsing, drying)?: A Little Help from another person to put on and taking off regular upper body clothing?: A Little Help from another person to put on and taking off regular lower body clothing?: A Little 6 Click Score: 19    End of Session Equipment Utilized During Treatment: Gait belt;Rolling walker (2 wheels)  OT Visit Diagnosis: Unsteadiness on feet (R26.81);Muscle weakness (generalized) (M62.81);Pain   Activity Tolerance Patient tolerated treatment well   Patient Left in bed;with call bell/phone within reach   Nurse Communication Mobility status        Time: 1884-1660 OT Time Calculation (min): 19 min  Charges: OT General Charges $OT Visit: 1 Visit OT Treatments $Self Care/Home Management : 8-22 mins    Brysen Shankman A Zaydin Billey 04/10/2022, 2:17 PM

## 2022-04-10 NOTE — Progress Notes (Signed)
Physical Therapy Treatment Patient Details Name: [name] MRN: [id number] DOB: 08/12/1961 Today's Date: 04/10/2022   History of Present Illness [name] is a 61 y.o. female who underwent thoracic ten to Lumbar three Posterior lateral arthrodesis with screws 03/22/22. S/p thoracic wound exploration with repair of CSF leak 6/24.  S/p lumbar wound debridement on 04/08/22 due to infection.  PMH: anemia, anxiety, fibromyalgia, GERD, HTN, hyperthyroidism, IBS, ACDF 3/4    PT Comments    Pt limited in mobility progression this date due to extended period of time with pt trying to have a BM during the session. Pt with questionable cognitive status, placing a face mask over her eyes and getting upset stating she already said she was not going to nap when PT asked her for the first time. Pt also needing cues to keep the RW with her to turn to sit as pt demonstrated instability when she tried to leave the RW. She is at risk for falls and would benefit from HHPT with [facility] services. Pt declining SNF. Will continue to follow acutely.     Recommendations for follow up therapy are one component of a multi-disciplinary discharge planning process, led by the attending physician.  Recommendations may be updated based on patient status, additional functional criteria and insurance authorization.  Follow Up Recommendations  Home health PT (with [place] services; pt declining SNF) Can patient physically be transported by private vehicle: Yes   Assistance Recommended at Discharge Intermittent Supervision/Assistance  Patient can return home with the following A little help with bathing/dressing/bathroom;Help with stairs or ramp for entrance;Assist for transportation;Assistance with cooking/housework;A little help with walking and/or transfers   Equipment Recommendations  None recommended by PT    Recommendations for Other Services       Precautions / Restrictions Precautions Precautions:  Back;Fall Precaution Booklet Issued: Yes (comment) Precaution Comments: reminders during functional tasks for precautions Required Braces or Orthoses:  (no brace needed) Restrictions Weight Bearing Restrictions: No Other Position/Activity Restrictions: no brace needed     Mobility  Bed Mobility               General bed mobility comments: Pt up in chair upon arrival.    Transfers Overall transfer level: Needs assistance Equipment used: Rolling walker (2 wheels) Transfers: Sit to/from Stand Sit to Stand: Supervision           General transfer comment: Supervision to come to stand from recliner, pushing from arm rests.    Ambulation/Gait Ambulation/Gait assistance: Supervision Gait Distance (Feet): 20 Feet Assistive device: Rolling walker (2 wheels) Gait Pattern/deviations: Step-through pattern, Decreased stride length, Decreased dorsiflexion - left, Trunk flexed, Decreased dorsiflexion - right, Shuffle, Narrow base of support Gait velocity: reduced Gait velocity interpretation: <1.31 ft/sec, indicative of household ambulator   General Gait Details: Pt with slow, but mostly steady gait using RW. Pt with poor feet clearance, shuffling at times. No LOB, supervision for safety. Needed cues to bring RW with her to back up to toilet to sit initially.   Stairs             Wheelchair Mobility    Modified Rankin (Stroke Patients Only)       Balance Overall balance assessment: Needs assistance Sitting-balance support: Feet supported, No upper extremity supported Sitting balance-[name]: Good     Standing balance support: During functional activity, Bilateral upper extremity supported, No upper extremity supported Standing balance-[name]: Fair Standing balance comment: Able to stand statically without UE support, reliant on  RW for mobility                            Cognition Arousal/Alertness: Awake/alert Behavior During Therapy: WFL  for tasks assessed/performed Overall Cognitive Status: No family/caregiver present to determine baseline cognitive functioning Area of Impairment: Memory, Attention, Problem solving, Safety/judgement                   Current Attention Level: Selective Memory: Decreased recall of precautions   Safety/Judgement: Decreased awareness of safety   Problem Solving: Slow processing, Requires verbal cues General Comments: Continues to require cues for spinal precautions compliance. Questionable comprehension of her safety as pt tried to leave her RW before fully turning to sit on toilet, needing cues to keep it with her. Pt also placing face mask over her eyes and asking lights to be off in the bathroom. When PT asked if she was ok she said "yes" then when asked if she was about to take a nap she said "no, I already told you no, I'm not going to nap". No family present to confirm her baseline though.        Exercises      General Comments        Pertinent Vitals/Pain Pain Assessment Pain Assessment: Faces Faces Pain Scale: Hurts a little bit Pain Location: back Pain Descriptors / Indicators: Discomfort, Grimacing, Operative site guarding Pain Intervention(s): Limited activity within patient's tolerance, Monitored during session, Repositioned    Home Living                          Prior Function            PT Goals (current goals can now be found in the care plan section) Acute Rehab PT Goals Patient Stated Goal: to go home PT Goal Formulation: With patient Time For Goal Achievement: 04/22/22 Potential to Achieve Goals: Good Progress towards PT goals: Not progressing toward goals - comment (limited by long attempt at [facility] today)    Frequency    Min 5X/week      PT Plan Current plan remains appropriate    Co-evaluation              AM-PAC PT "6 Clicks" Mobility   Outcome Measure  Help needed turning from your back to your side while in a flat  bed without using bedrails?: A Little Help needed moving from lying on your back to sitting on the side of a flat bed without using bedrails?: A Little Help needed moving to and from a bed to a chair (including a wheelchair)?: A Little Help needed standing up from a chair using your arms (e.g., wheelchair or bedside chair)?: A Little Help needed to walk in hospital room?: A Little Help needed climbing 3-5 steps with a railing? : A Little 6 Click Score: 18    End of Session   Activity Tolerance: Patient tolerated treatment well;Other (comment) (limited by long attempt at [facility]) Patient left: with call bell/phone within reach;Other (comment) (on toilet with OT)   PT Visit Diagnosis: Difficulty in walking, not elsewhere classified (R26.2);Muscle weakness (generalized) (M62.81);Unsteadiness on feet (R26.81);Other abnormalities of gait and mobility (R26.89)     Time: 5277-8242 PT Time Calculation (min) (ACUTE ONLY): 22 min  Charges:  $Therapeutic Activity: 8-22 mins                     Random Dobrowski  Pettis, PT, DPT Acute Rehabilitation Services  Office: [phone]    Jewel Baize 04/10/2022, 11:12 AM

## 2022-04-11 MED ORDER — SODIUM CHLORIDE 0.9 % IV SOLN
2.0000 g | Freq: Three times a day (TID) | INTRAVENOUS | Status: DC
  Administered 2022-04-11 – 2022-05-01 (×60): 2 g via INTRAVENOUS
  Filled 2022-04-11 (×64): qty 12.5

## 2022-04-11 NOTE — Progress Notes (Signed)
Physical Therapy Treatment Patient Details Name: [name] MRN: [id number] DOB: 06/18/1961 Today's Date: 04/11/2022   History of Present Illness [name] is a 61 y.o. female who underwent thoracic ten to Lumbar three Posterior lateral arthrodesis with screws 03/22/22. S/p thoracic wound exploration with repair of CSF leak 6/24.  S/p lumbar wound debridement on 04/08/22 due to infection.  PMH: anemia, anxiety, fibromyalgia, GERD, HTN, hyperthyroidism, IBS, ACDF 3/4    PT Comments    Pt is continuing to mobilize at a supervision level with a RW, but does have moments of questionable cognition and poor spinal precautions compliance. For example, pt had unhooked herself from the monitor and was about to take herself to the bathroom when PT arrived. Pt reported she was permitted to go alone by nursing, but RN and NT reported this was not the case. Pt became defensive stating she already went through this, but this did not occur with PT and RN reported this was not the case either. In addition, she does display lower extremity weakness and balance deficits that place her at risk for falls. Pt performed x5 sit <> stand reps in 30 seconds using her upper extremities, which is suggestive of pt being at risk for falls (score of <12 for women 16-64 y.o. is below average). Pt also ambulates at a slow pace of 1.41 ft/sec, which has not improved since 04/04/22 (1.43 ft/sec). Educated pt on her risk for falls with her verbalizing understanding that staff still recommends she call for nursing assistance and not get up alone. Chair alarm on end of session. Will continue to follow acutely. Current recommendations remain appropriate.     Recommendations for follow up therapy are one component of a multi-disciplinary discharge planning process, led by the attending physician.  Recommendations may be updated based on patient status, additional functional criteria and insurance authorization.  Follow Up  Recommendations  Home health PT (with HH services; pt declining SNF) Can patient physically be transported by private vehicle: Yes   Assistance Recommended at Discharge Intermittent Supervision/Assistance  Patient can return home with the following A little help with bathing/dressing/bathroom;Help with stairs or ramp for entrance;Assist for transportation;Assistance with cooking/housework;A little help with walking and/or transfers   Equipment Recommendations  None recommended by PT    Recommendations for Other Services       Precautions / Restrictions Precautions Precautions: Back;Fall Precaution Booklet Issued: Yes (comment) Precaution Comments: reminders during functional tasks for precautions Required Braces or Orthoses:  (no brace needed) Restrictions Weight Bearing Restrictions: No Other Position/Activity Restrictions: no brace needed     Mobility  Bed Mobility               General bed mobility comments: Pt up in chair upon arrival.    Transfers Overall transfer level: Needs assistance Equipment used: Rolling walker (2 wheels) Transfers: Sit to/from Stand Sit to Stand: Supervision           General transfer comment: Supervision to come to stand from recliner 7x and from bedside commode over toilet 2x, pushing from arm rests.    Ambulation/Gait Ambulation/Gait assistance: Supervision Gait Distance (Feet): 210 Feet (x4 bouts of ~20 ft > ~210 ft > ~20 ft > ~20 ft) Assistive device: Rolling walker (2 wheels) Gait Pattern/deviations: Step-through pattern, Decreased stride length, Decreased dorsiflexion - left, Trunk flexed, Decreased dorsiflexion - right, Shuffle, Narrow base of support Gait velocity: reduced Gait velocity interpretation: <1.8 ft/sec, indicate of risk for recurrent falls (1.41 ft/sec)  General Gait Details: Upon arrival, pt had unhooked herself from the monitor to take herself to the bathroom. Pt with slow, but mostly steady gait using RW.  Pt with poor feet clearance, shuffling at times. No LOB, supervision for safety. Cues provided to increase speed, with mod success. Able to change head positions without LOB   Stairs             Wheelchair Mobility    Modified Rankin (Stroke Patients Only)       Balance Overall balance assessment: Needs assistance Sitting-balance support: Feet supported, No upper extremity supported Sitting balance-[name]: Good     Standing balance support: During functional activity, Bilateral upper extremity supported, No upper extremity supported Standing balance-[name]: Fair Standing balance comment: Able to stand at sink without UE support, reliant on RW for mobility                            Cognition Arousal/Alertness: Awake/alert Behavior During Therapy: WFL for tasks assessed/performed Overall Cognitive Status: No family/caregiver present to determine baseline cognitive functioning Area of Impairment: Memory, Attention, Problem solving, Safety/judgement                   Current Attention Level: Selective Memory: Decreased recall of precautions   Safety/Judgement: Decreased awareness of safety   Problem Solving: Slow processing, Requires verbal cues General Comments: Pt able to recall "BLT" with extra time, needs cues for compliance with functional mobility intermittently. Upon arrival, pt had unclipped all monitors from her skin, not by disconnecting the cords, reporting she was taking herself to the bathroom. Notified RN that pt reports she can take herself, but RN and NT reported this was not the case. Poor insight into deficits/safety and poor memory of need to call for staff. Educated pt of her risk for falls, but unsure of full comprehension. Pt not making sense at times talking about rehooking herself up to the monitor.        Exercises      General Comments General comments (skin integrity, edema, etc.): 5x STS = 30 seconds using UEs       Pertinent Vitals/Pain Pain Assessment Pain Assessment: Faces Faces Pain Scale: Hurts a little bit Pain Location: back Pain Descriptors / Indicators: Discomfort, Grimacing, Operative site guarding Pain Intervention(s): Limited activity within patient's tolerance, Monitored during session, Repositioned    Home Living                          Prior Function            PT Goals (current goals can now be found in the care plan section) Acute Rehab PT Goals Patient Stated Goal: to go home PT Goal Formulation: With patient Time For Goal Achievement: 04/22/22 Potential to Achieve Goals: Good Progress towards PT goals: Progressing toward goals    Frequency    Min 5X/week      PT Plan Current plan remains appropriate    Co-evaluation              AM-PAC PT "6 Clicks" Mobility   Outcome Measure  Help needed turning from your back to your side while in a flat bed without using bedrails?: A Little Help needed moving from lying on your back to sitting on the side of a flat bed without using bedrails?: A Little Help needed moving to and from a bed to a chair (including  a wheelchair)?: A Little Help needed standing up from a chair using your arms (e.g., wheelchair or bedside chair)?: A Little Help needed to walk in hospital room?: A Little Help needed climbing 3-5 steps with a railing? : A Little 6 Click Score: 18    End of Session   Activity Tolerance: Patient tolerated treatment well Patient left: in chair;with call bell/phone within reach;with chair alarm set Nurse Communication: Other (comment) (notified RN that pt stating she was told by nursing she could take herself to the bathroom, RN and NT reported this was not the case) PT Visit Diagnosis: Difficulty in walking, not elsewhere classified (R26.2);Muscle weakness (generalized) (M62.81);Unsteadiness on feet (R26.81);Other abnormalities of gait and mobility (R26.89)     Time: 1313-1401 PT Time  Calculation (min) (ACUTE ONLY): 48 min  Charges:  $Gait Training: 23-37 mins $Therapeutic Activity: 8-22 mins                     Raymond Gurney, PT, DPT Acute Rehabilitation Services  Office: [phone]    Jewel Baize 04/11/2022, 3:06 PM

## 2022-04-12 NOTE — Progress Notes (Signed)
Patient ID: [name], female   DOB: [birth year], 61 y.o.   MRN: [id number] BP 122/65 (BP Location: Right Arm)   Pulse 80   Temp 98.7 F (37.1 C) (Oral)   Resp 13   Ht 5\' 3"  (1.6 m)   Wt 93.9 kg   LMP 11/20/2012   SpO2 100%   BMI 36.67 kg/m  Alert and oriented x 4. Afebrile Moving lower extremities well Wound dressing changed. Will change again on Sunday

## 2022-04-12 NOTE — Progress Notes (Addendum)
Physical Therapy Treatment Patient Details Name: [name] MRN: [id number] DOB: [birth year] Today's Date: 04/12/2022   History of Present Illness Delano I Hayse is a 61 y.o. female who underwent thoracic ten to Lumbar three Posterior lateral arthrodesis with screws 03/22/22. S/p thoracic wound exploration with repair of CSF leak 6/24.  S/p lumbar wound debridement on 04/08/22 due to infection.  PMH: anemia, anxiety, fibromyalgia, GERD, HTN, hyperthyroidism, IBS, ACDF 3/4    PT Comments    Continue to work toward increasing independence and safety prior to returning home. Needs verbal cues for safety.  Recommendations for follow up therapy are one component of a multi-disciplinary discharge planning process, led by the attending physician.  Recommendations may be updated based on patient status, additional functional criteria and insurance authorization.  Follow Up Recommendations  Home health PT (pt declining SNF) Can patient physically be transported by private vehicle: Yes   Assistance Recommended at Discharge Intermittent Supervision/Assistance  Patient can return home with the following A little help with bathing/dressing/bathroom;Help with stairs or ramp for entrance;Assist for transportation;Assistance with cooking/housework;A little help with walking and/or transfers   Equipment Recommendations  None recommended by PT    Recommendations for Other Services       Precautions / Restrictions Precautions Precautions: Back;Fall Precaution Booklet Issued: Yes (comment) Precaution Comments: reminders during functional tasks for precautions Required Braces or Orthoses:  (no brace needed) Restrictions Weight Bearing Restrictions: No Other Position/Activity Restrictions: no brace needed     Mobility  Bed Mobility Overal bed mobility: Needs Assistance Bed Mobility: Sit to Sidelying         Sit to sidelying: Min assist General bed mobility comments: Assist to bring RLE all  the way up into the bed    Transfers Overall transfer level: Needs assistance Equipment used: Rolling walker (2 wheels) Transfers: Sit to/from Stand Sit to Stand: Supervision           General transfer comment: Verbal cues for hand placement going to sit    Ambulation/Gait Ambulation/Gait assistance: Supervision Gait Distance (Feet): 225 Feet Assistive device: Rolling walker (2 wheels) Gait Pattern/deviations: Step-through pattern, Decreased stride length, Decreased dorsiflexion - left, Trunk flexed, Decreased dorsiflexion - right, Shuffle, Narrow base of support Gait velocity: decr Gait velocity interpretation: <1.8 ft/sec, indicate of risk for recurrent falls   General Gait Details: Assist for safety   Stairs             Wheelchair Mobility    Modified Rankin (Stroke Patients Only)       Balance Overall balance assessment: Needs assistance Sitting-balance support: Feet supported, No upper extremity supported Sitting balance-[name]: Good     Standing balance support: During functional activity, Bilateral upper extremity supported, No upper extremity supported Standing balance-[name]: Fair                              Cognition Arousal/Alertness: Awake/alert Behavior During Therapy: WFL for tasks assessed/performed Overall Cognitive Status: No family/caregiver present to determine baseline cognitive functioning Area of Impairment: Memory, Attention, Problem solving, Safety/judgement                   Current Attention Level: Selective Memory: Decreased recall of precautions   Safety/Judgement: Decreased awareness of safety   Problem Solving: Slow processing, Requires verbal cues          Exercises      General Comments  Pertinent Vitals/Pain Pain Assessment Pain Assessment: 0-10 Pain Score: 10-Worst pain ever Pain Location: from head to toe Pain Intervention(s): Repositioned, Limited activity within  patient's tolerance    Home Living                          Prior Function            PT Goals (current goals can now be found in the care plan section) Acute Rehab PT Goals Patient Stated Goal: to go home Progress towards PT goals: Progressing toward goals    Frequency    Min 3X/week      PT Plan Current plan remains appropriate;Frequency needs to be updated    Co-evaluation              AM-PAC PT "6 Clicks" Mobility   Outcome Measure  Help needed turning from your back to your side while in a flat bed without using bedrails?: A Little Help needed moving from lying on your back to sitting on the side of a flat bed without using bedrails?: A Little Help needed moving to and from a bed to a chair (including a wheelchair)?: A Little Help needed standing up from a chair using your arms (e.g., wheelchair or bedside chair)?: A Little Help needed to walk in hospital room?: A Little Help needed climbing 3-5 steps with a railing? : A Little 6 Click Score: 18    End of Session Equipment Utilized During Treatment: Gait belt Activity Tolerance: Patient tolerated treatment well Patient left: with call bell/phone within reach;in bed;with bed alarm set   PT Visit Diagnosis: Difficulty in walking, not elsewhere classified (R26.2);Muscle weakness (generalized) (M62.81);Unsteadiness on feet (R26.81);Other abnormalities of gait and mobility (R26.89)     Time: 1700-1749 PT Time Calculation (min) (ACUTE ONLY): 34 min  Charges:  $Gait Training: 23-37 mins                     [facility] PT Acute Rehabilitation Services Office [phone]    Angelina Ok [facility] 04/12/2022, 5:09 PM

## 2022-04-12 NOTE — Progress Notes (Signed)
Mobility Specialist Progress Note   04/12/22 1200  Mobility  Activity Ambulated with assistance in hallway;Ambulated with assistance to bathroom  Level of Assistance Standby assist, set-up cues, supervision of patient - no hands on  Assistive Device Front wheel walker  Distance Ambulated (ft) 620 ft  Activity Response Tolerated well  $Mobility charge 1 Mobility   Pre Mobility: 77 HR, 105/63 BP During Mobility: 93 HR Post Mobility: 74 HR  Pt received in bed and agreeable to mobility. C/o LBP pain that they rated 8/10, claimed this is there common pain level. Pt having no complaints during ambulation but having x2 bouts of buckling in R knee, pt able to stabilize self w/ minG. Pt back to chair after session with all needs met and NT present.  Holland Falling Mobility Specialist MS [facility] #:  [phone] Acute Rehab Office:  [phone]

## 2022-04-12 NOTE — TOC Progression Note (Signed)
Transition of Care [facility]) - Progression Note    Patient Details  Name: ABREY BRADWAY MRN: [id number] Date of Birth: 12/30/1960  [facility]) CM/SW Contact  Beckie Busing, RN Phone Number:[phone]  04/12/2022, 2:51 PM  Clinical Narrative:    TOC continues to follow. Per last MD note gram negative rods located in the wound. Ecoli found on urine culture with sensitivities pending. Home health has been set up with [facility]. MD will need to enter [facility] orders. TOC will continue to follow for disposition planning.    Expected Discharge Plan: Home w Home Health Services Barriers to Discharge: Continued Medical Work up  Expected Discharge Plan and Services Expected Discharge Plan: Home w Home Health Services In-house Referral: Clinical Social Work Discharge Planning Services: CM Consult Post Acute Care Choice: Home Health Living arrangements for the past 2 months: Single Family Home                           HH Arranged: PT, OT, Social Work, Disease Management HH Agency: [facility] Date HH Agency Contacted: 04/04/22 Time HH Agency Contacted: 1245 Representative spoke with at [facility] Agency: Morrie Sheldon   Social Determinants of Health (SDOH) Interventions    Readmission Risk Interventions     No data to display

## 2022-04-13 LAB — AEROBIC/ANAEROBIC CULTURE W GRAM STAIN (SURGICAL/DEEP WOUND)

## 2022-04-13 LAB — BASIC METABOLIC PANEL
Anion gap: 14 (ref 5–15)
BUN: 19 mg/dL (ref 6–20)
CO2: 26 mmol/L (ref 22–32)
Calcium: 9.1 mg/dL (ref 8.9–10.3)
Chloride: 99 mmol/L (ref 98–111)
Creatinine, Ser: 0.61 mg/dL (ref 0.44–1.00)
GFR, Estimated: >60 mL/min (ref >60)
Glucose, Bld: 123 mg/dL — ABNORMAL HIGH (ref 70–99)
Potassium: 3.9 mmol/L (ref 3.5–5.1)
Sodium: 139 mmol/L (ref 135–145)

## 2022-04-13 NOTE — Progress Notes (Signed)
No new events or problems overnight.  Pain well controlled.  Denies any lower extremity symptoms.  She is afebrile.  Her vital signs are stable.  Motor and sensory function are stable.  Abdomen soft.  Lumbar wound remains open with packing in place.  Status post lumbar wound dehiscence with plan for hopeful delayed healing with wound dressing changes.

## 2022-04-14 LAB — CBC
HCT: 28.8 % — ABNORMAL LOW (ref 36.0–46.0)
Hemoglobin: 8.2 g/dL — ABNORMAL LOW (ref 12.0–15.0)
MCH: 21 pg — ABNORMAL LOW (ref 26.0–34.0)
MCHC: 28.5 g/dL — ABNORMAL LOW (ref 30.0–36.0)
MCV: 73.7 fL — ABNORMAL LOW (ref 80.0–100.0)
Platelets: 514 10*3/uL — ABNORMAL HIGH (ref 150–400)
RBC: 3.91 MIL/uL (ref 3.87–5.11)
RDW: 19 % — ABNORMAL HIGH (ref 11.5–15.5)
WBC: 13.4 10*3/uL — ABNORMAL HIGH (ref 4.0–10.5)
nRBC: 0.4 % — ABNORMAL HIGH (ref 0.0–0.2)

## 2022-04-14 LAB — BASIC METABOLIC PANEL WITH GFR
Anion gap: 12 (ref 5–15)
BUN: 20 mg/dL (ref 6–20)
CO2: 28 mmol/L (ref 22–32)
Calcium: 9.3 mg/dL (ref 8.9–10.3)
Chloride: 97 mmol/L — ABNORMAL LOW (ref 98–111)
Creatinine, Ser: 0.65 mg/dL (ref 0.44–1.00)
GFR, Estimated: >60 mL/min
Glucose, Bld: 101 mg/dL — ABNORMAL HIGH (ref 70–99)
Potassium: 4.1 mmol/L (ref 3.5–5.1)
Sodium: 137 mmol/L (ref 135–145)

## 2022-04-14 NOTE — Progress Notes (Signed)
   Providing Compassionate, Quality Care - Together   Subjective: Patient reports spastic movements of her hands and dropping items since yesterday. Has some numbness in her hands.  Objective: Vital signs in last 24 hours: Temp:  [98.2 F (36.8 C)-98.7 F (37.1 C)] 98.2 F (36.8 C) (07/09 0706) Pulse Rate:  [68-80] 68 (07/09 0706) Resp:  [14-19] 17 (07/09 0706) BP: (101-143)/(52-96) 122/59 (07/09 0706) SpO2:  [91 %-100 %] 100 % (07/09 0706)  Intake/Output from previous day: 07/08 0701 - 07/09 0700 In: 3 [I.V.:3] Out: 350 [Urine:350] Intake/Output this shift: No intake/output data recorded.  Alert and oriented x 4 PERRLA MAE, Generalized weakness 4/5 throughout Incision packed with gauze; Dressing with moderate amount of serous drainage   Lab Results: Recent Labs    04/14/22 1104  WBC 13.4*  HGB 8.2*  HCT 28.8*  PLT 514*   BMET Recent Labs    04/13/22 0229 04/14/22 1104  NA 139 137  K 3.9 4.1  CL 99 97*  CO2 26 28  GLUCOSE 123* 101*  BUN 19 20  CREATININE 0.61 0.65  CALCIUM 9.1 9.3    Studies/Results: No results found.  Assessment/Plan: Patient underwent a T10-L3 posterior lateral fusion by Dr. Franky Macho on 03/22/2022. Developed wound drainage and was taken back to the OR for wound revision on 03/30/2022.   LOS: 23 days   -Home Health PT and OT recommended at discharge   Val Eagle, DNP, AGNP-C Nurse Practitioner  [facility] [address], [address], [place], [name] [address] P: [phone]    F: [phone]  04/14/2022, 1:00 PM

## 2022-04-14 NOTE — Progress Notes (Signed)
Mobility Specialist Progress Note:   04/14/22 1158  Mobility  Activity Ambulated with assistance in room  Level of Assistance Minimal assist, patient does 75% or more  Assistive Device Front wheel walker  Distance Ambulated (ft) 48 ft  Activity Response Tolerated well  $Mobility charge 1 Mobility   Pt received in bed willing to participate in mobility. No complaints of pain. Left in chair with call bell in reach and all needs met.   [facility] Wasyl Dornfeld Mobility Specialist

## 2022-04-14 NOTE — Progress Notes (Signed)
Mobility Specialist Progress Note:   04/14/22 0900  Mobility  Activity Refused mobility   Pt sleeping. Will follow-up as time allows.   [facility] Denzil Mceachron Mobility Specialist

## 2022-04-15 DIAGNOSIS — T8142XA Infection following a procedure, deep incisional surgical site, initial encounter: Secondary | ICD-10-CM

## 2022-04-15 DIAGNOSIS — B9561 Methicillin susceptible Staphylococcus aureus infection as the cause of diseases classified elsewhere: Secondary | ICD-10-CM

## 2022-04-15 DIAGNOSIS — M4316 Spondylolisthesis, lumbar region: Secondary | ICD-10-CM

## 2022-04-15 DIAGNOSIS — T8579XA Infection and inflammatory reaction due to other internal prosthetic devices, implants and grafts, initial encounter: Secondary | ICD-10-CM

## 2022-04-15 NOTE — Progress Notes (Signed)
Physical Therapy Treatment Patient Details Name: [name] MRN: [id number] DOB: [birth year] Today's Date: 04/15/2022   History of Present Illness [name] is a 61 y.o. female who underwent thoracic ten to Lumbar three Posterior lateral arthrodesis with screws 03/22/22. S/p thoracic wound exploration with repair of CSF leak 6/24.  S/p lumbar wound debridement on 04/08/22 due to infection.  PMH: anemia, anxiety, fibromyalgia, GERD, HTN, hyperthyroidism, IBS, ACDF 3/4    PT Comments    Pt admitted with above diagnosis. Pt limited today by continues UE and LE spasms hindering safe progression of mobility. Pt tearful throughout much of session as she wanted to do more but was very unstable on her feet.  MD is aware.  Will need 24 hour care at current level with SNF stay expected.   Pt currently with functional limitations due to balance and endurance deficits. Pt will benefit from skilled PT to increase their independence and safety with mobility to allow discharge to the venue listed below.      Recommendations for follow up therapy are one component of a multi-disciplinary discharge planning process, led by the attending physician.  Recommendations may be updated based on patient status, additional functional criteria and insurance authorization.  Follow Up Recommendations  Skilled nursing-short term rehab (<3 hours/day) (will need 24 hour care if home and HHPT,HHOT and HHAide) Can patient physically be transported by private vehicle: No   Assistance Recommended at Discharge Intermittent Supervision/Assistance  Patient can return home with the following A little help with bathing/dressing/bathroom;Help with stairs or ramp for entrance;Assist for transportation;Assistance with cooking/housework;A lot of help with walking and/or transfers   Equipment Recommendations  None recommended by PT    Recommendations for Other Services       Precautions / Restrictions Precautions Precautions:  Back;Fall Precaution Booklet Issued: Yes (comment) Precaution Comments: cueing for back precautions funcitonally Required Braces or Orthoses:  (no brace needed) Restrictions Weight Bearing Restrictions: No Other Position/Activity Restrictions: no brace needed     Mobility  Bed Mobility Overal bed mobility: Needs Assistance Bed Mobility: Rolling, Sidelying to Sit Rolling: Supervision Sidelying to sit: Min assist       General bed mobility comments: Needed a little assist with coming to EOB today due to slow moving and pain as well as spasms with UES difficult for pt to coordinate.    Transfers Overall transfer level: Needs assistance Equipment used: Rolling walker (2 wheels) Transfers: Sit to/from Stand Sit to Stand: Min assist, +2 safety/equipment, From elevated surface   Step pivot transfers: Mod assist, +2 physical assistance       General transfer comment: Pt needed min assist to power up.  Pt with spasms of UE and LEs causing instability with baalnce in standing with knees buckling at times and needed steadying/mod assist to recover. Was able to step around to chair and march in place 10 x 2 however close guard as pt with knee buckling.    Ambulation/Gait               General Gait Details: Unable due to spasms bil UE and LEs   Stairs             Wheelchair Mobility    Modified Rankin (Stroke Patients Only)       Balance Overall balance assessment: Needs assistance Sitting-balance support: Feet supported, No upper extremity supported Sitting balance-[name]: Fair     Standing balance support: During functional activity, Bilateral upper extremity supported, Reliant on assistive device  for balance Standing balance-[name]: Poor Standing balance comment: relies on RW for balance in static standing                            Cognition Arousal/Alertness: Awake/alert Behavior During Therapy: WFL for tasks  assessed/performed Overall Cognitive Status: No family/caregiver present to determine baseline cognitive functioning Area of Impairment: Memory, Attention, Problem solving, Safety/judgement                   Current Attention Level: Selective Memory: Decreased recall of precautions, Decreased short-term memory   Safety/Judgement: Decreased awareness of safety, Decreased awareness of deficits   Problem Solving: Slow processing, Difficulty sequencing, Requires verbal cues General Comments: Pt slow to follow commands at times.        Exercises General Exercises - Lower Extremity Hip Flexion/Marching: AROM, 20 reps, Standing    General Comments General comments (skin integrity, edema, etc.): Noted drainage from back  on pad and nurse aware.      Pertinent Vitals/Pain Pain Assessment Pain Assessment: Faces Faces Pain Scale: Hurts even more Pain Location: generalized Pain Descriptors / Indicators: Discomfort, Grimacing, Operative site guarding Pain Intervention(s): Limited activity within patient's tolerance, Monitored during session, Repositioned    Home Living                          Prior Function            PT Goals (current goals can now be found in the care plan section) Acute Rehab PT Goals Patient Stated Goal: to go home Progress towards PT goals: Progressing toward goals    Frequency    Min 3X/week      PT Plan Discharge plan needs to be updated    Co-evaluation              AM-PAC PT "6 Clicks" Mobility   Outcome Measure  Help needed turning from your back to your side while in a flat bed without using bedrails?: A Little Help needed moving from lying on your back to sitting on the side of a flat bed without using bedrails?: A Little Help needed moving to and from a bed to a chair (including a wheelchair)?: Total Help needed standing up from a chair using your arms (e.g., wheelchair or bedside chair)?: Total Help needed to walk  in hospital room?: Total Help needed climbing 3-5 steps with a railing? : Total 6 Click Score: 10    End of Session Equipment Utilized During Treatment: Gait belt Activity Tolerance: Patient limited by fatigue;Patient limited by pain (limited by UE and LE spasms) Patient left: with call bell/phone within reach;in chair;with chair alarm set Nurse Communication: Mobility status;Need for lift equipment PT Visit Diagnosis: Difficulty in walking, not elsewhere classified (R26.2);Muscle weakness (generalized) (M62.81);Unsteadiness on feet (R26.81);Other abnormalities of gait and mobility (R26.89)     Time: 1037-1100 PT Time Calculation (min) (ACUTE ONLY): 23 min  Charges:  $Gait Training: 23-37 mins                     Darchelle Nunes M,PT Acute Rehab Services [phone]    Bevelyn Buckles 04/15/2022, 2:53 PM

## 2022-04-15 NOTE — Progress Notes (Signed)
Patient ID: [name], female   DOB: 26-Nov-1960, 61 y.o.   MRN: [id number] BP 118/63 (BP Location: Right Arm)   Pulse 67   Temp 97.7 F (36.5 C) (Oral)   Resp 17   Ht 5\' 3"  (1.6 m)   Wt 93.9 kg   LMP 11/20/2012   SpO2 100%   BMI 36.67 kg/m  Alert oriented x4, speech is clear and fluent Moving all extremities well Wound base is clean, no evidence infection.  Will await ID recommendations

## 2022-04-15 NOTE — Progress Notes (Signed)
Mobility Specialist Progress Note   04/15/22 1440  Mobility  Activity Transferred from chair to bed  Level of Assistance Minimal assist, patient does 75% or more  Assistive Device Front wheel walker  Distance Ambulated (ft) 2 ft  Activity Response Tolerated well  $Mobility charge 1 Mobility   RN requesting assistance to get pt from chair to bed. Required minA throughout with use of RW for transfer. Pt still having tremors during transfer and seemingly having a cognitive delay requiring verbal cues for sequencing the entire time. Pt left in bed w/o incident, w/ call bell in reach and chair alarm on.  Frederico Hamman Mobility Specialist MS [facility] #:  [phone] Acute Rehab Office:  [phone]

## 2022-04-15 NOTE — Consult Note (Addendum)
[facility] Infect[facility]    Date of Admission:  03/22/2022   Total days of inpatient antibiotics 10        Reason for Consult: Post-op wound    Principal Problem:   Spondylolisthesis of lumbar region Active Problems:   Scoliosis due to degenerative disease of spine in adult patient   Postoperative wound infection   Assessment: 7 YF with MSSA HW associated lumbar wound infection completed suppressive keflex may 2021 admitted for L1/2 arthrodesis c/b wound drainage requiring I&D with cx+ enterobacter cloacae   #Postop op wound with retained HWR #Hx of MSSA lumbar hardware infection #Fever-resolved -6/16->T10 to L3 posterior later arthrodesis -Developed profuse drainage from wound -OR io 624 for thoracic wound exploration with repair of csf leak. Noted to have csf leak from dural opening, pice of fat placed ober  then dural sealant used. Opening could not be primarily sealed -Pt fevered on 6/30 -MRI on 7/3 showed fluid collection tracking along left posterior spinal rod from lower T spine to l4. Collection crosses midline at lumbar laminectomy space L1-L2 communicating with superficial fluid -OR on 7/3 for lumbar debridement. Noted to have pus through out length and depth on incision. Sponge placed on either side of wound -OR Cx+ Enterobacter, pt is now on cefepime Recommendations:  -Continue cefepime, anticipate 8 weeks from last I&D -Needs suppresive antibioics for retained hardware(she does not recall her allergy to bactrim)  Microbiology:   Antibiotics: Cefepime 7/6-p Ceftriaxone 7/3-7/5 Vancomycin 6/30-7/3 Cultures: Blood 6/30 ng 7/5 ng Urine 6/30 urine cx >100,000 ecoli Other 7/3 csf cx enterobacter cloacae  HPI: [name] is a 61 y.o. 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 Baxter Flattery was doing well off of antibiotics.  She was admitted to [facility] 03/22/2022 as she continued to have low back pain.  In the setting of degenerative scoliosis.  Plan was to take patient to the OR for arthrodesis of L1/2.  On 616 she went underwent thoracic 10 to lumbar 3 posterior lateral arthrodesis with screws.  On 6/23 noted to have serous drainage from wound.  On 624 she underwent thoracic wound exploration with repair of CSF leak.  She developed fevers on 6/30 as/underwent lumbar wound debridement on 7/3.  OR cultures from 7/2 grew Enterobacter cloacae.  ID engaged for further recommendations.  On 7/5 patient underwent thoracic 10 to lumbar 3   Review of Systems: Review of Systems  All other systems reviewed and are negative.   Past Medical History:  Diagnosis Date   Anemia    Ankle syndesmosis disruption    Anxiety    takes Ativan and Valium, after mother passed   Arthritis    bilateral knees s/p knee replacement bilaterally   Asthma    Bronchitis    Bruising    pt states unexplained d/t fibromyalgia   Chronic back pain    2012 tailbone surgery and 3 lower discs.    Closed fracture of distal end of right fibula and tibia    Depression    from Fibromyalgia diagnosis; not taking medicine. since 2001   Dizziness    rarely   Fibromyalgia    diagnosed 2001   GERD (gastroesophageal reflux disease)    Prilosec occasionally   Headache(784.0)    "sinus headaches"   History  of hiatal hernia    Hypertension    since 2013   Hyperthyroidism    subclinical, no treatment; thyroid nodules   IBS (irritable bowel syndrome)    Impaired memory    states from fibromyalgia   Insomnia    takes Ambien   Jones fracture    left foot fifth metatarsal   Multiple allergies    including latex, pet dander, shellfish, pet dander   Painful orthopaedic hardware right ankle 05/27/2016   Seasonal allergies    Shortness of breath    Occasional with exertion;    Sore gums     this is why pt is on Amoxil-only takes for dental work   Tachycardia    Thyroid goiter    Varicose vein    protrudes above skin-per pt;vein popped and bruised;ultrasound done to make sure that there were no clots;noclots were found    Social History   Tobacco Use   Smoking status: Former    Packs/day: 0.50    Years: 10.00    Total pack years: 5.00    Types: Cigarettes    Quit date: 12/06/1998    Years since quitting: 23.3   Smokeless tobacco: Never  Vaping Use   Vaping Use: Never used  Substance Use Topics   Alcohol use: No   Drug use: No    Family History  Problem Relation Age of Onset   Stroke Father        passed 2004 from pneumonia   Alcohol abuse Father    Pulmonary embolism Mother        after minor knee surgery leading to DVT   Arthritis Sister    Depression Sister    Hypertension Sister    Diabetes Other        grandmother   Anesthesia problems Neg Hx    Hypotension Neg Hx    Malignant hyperthermia Neg Hx    Pseudochol deficiency Neg Hx    Scheduled Meds:  ascorbic acid  500 mg Oral Daily   aspirin EC  81 mg Oral Daily   atenolol  50 mg Oral Daily   celecoxib  200 mg Oral BID   Chlorhexidine Gluconate Cloth  6 each Topical Q0600   chlorzoxazone  500 mg Oral TID   cyclobenzaprine  10 mg Oral Q8H   furosemide  40 mg Oral Q24H   gabapentin  1,200 mg Oral TID   heparin injection (subcutaneous)  5,000 Units Subcutaneous Q8H   hydrochlorothiazide  12.5 mg Oral Daily   hydrOXYzine  50 mg Oral BID   lubiprostone  48 mcg Oral QAC supper   multivitamin with minerals  1 tablet Oral Daily   oxyCODONE  40 mg Oral Q12H   pantoprazole  40 mg Oral Daily   potassium chloride  10 mEq Oral Daily   sodium chloride flush  3 mL Intravenous Q12H   Continuous Infusions:  sodium chloride Stopped (03/22/22 1815)   ceFEPime (MAXIPIME) IV 2 g (04/15/22 1241)   PRN Meds:.acetaminophen **OR** acetaminophen, albuterol, bisacodyl, diazepam, diphenhydrAMINE, guaiFENesin,  hydrocortisone cream, menthol-cetylpyridinium **OR** phenol, morphine injection, ondansetron **OR** ondansetron (ZOFRAN) IV, mouth rinse, oxyCODONE, oxyCODONE, polyvinyl alcohol, senna, sodium chloride flush, zolpidem Allergies  Allergen Reactions   Monosodium Glutamate Anaphylaxis and Swelling    Eyes swollen shut, facial swelling, tongue swelling.   Shellfish Allergy Anaphylaxis   Baclofen Other (See Comments)   Celebrex [Celecoxib] Itching    Only allergic to generic brand   Contrast Media [Iodinated Contrast Media] Itching and  Nausea Only    "could not walk"   Diclofenac Itching    Generic Diclefenac gel causes itching. Can take the name brand Voltaren gel   Molds & Smuts Other (See Comments)   Other Other (See Comments)    Pet dander, Mildew, Mold  Cannot tolerate generic DICLOFENAC GEL--MUST USE BRAND NAME (GENERIC CAUSES ITCHING)    Sulfa Antibiotics    Latex Itching and Rash    Latex glove with powder    OBJECTIVE: Blood pressure 118/63, pulse 67, temperature 97.7 F (36.5 C), temperature source Oral, resp. rate 17, height 5\' 3"  (1.6 m), weight 93.9 kg, last menstrual period 11/20/2012, SpO2 100 %.  Physical Exam Constitutional:      Appearance: Normal appearance.  HENT:     Head: Normocephalic and atraumatic.     Right Ear: Tympanic membrane normal.     Left Ear: Tympanic membrane normal.     Nose: Nose normal.     Mouth/Throat:     Mouth: Mucous membranes are moist.  Eyes:     Extraocular Movements: Extraocular movements intact.     Conjunctiva/sclera: Conjunctivae normal.     Pupils: Pupils are equal, round, and reactive to light.  Cardiovascular:     Rate and Rhythm: Normal rate and regular rhythm.     Heart sounds: No murmur heard.    No friction rub. No gallop.  Pulmonary:     Effort: Pulmonary effort is normal.     Breath sounds: Normal breath sounds.  Abdominal:     General: Abdomen is flat.     Palpations: Abdomen is soft.  Musculoskeletal:         General: Normal range of motion.  Skin:    General: Skin is warm and dry.     Comments: Drainage around back surgically placed sponges  Neurological:     General: No focal deficit present.     Mental Status: She is alert and oriented to person, place, and time.  Psychiatric:        Mood and Affect: Mood normal.     Lab Results Lab Results  Component Value Date   WBC 13.4 (H) 04/14/2022   HGB 8.2 (L) 04/14/2022   HCT 28.8 (L) 04/14/2022   MCV 73.7 (L) 04/14/2022   PLT 514 (H) 04/14/2022    Lab Results  Component Value Date   CREATININE 0.65 04/14/2022   BUN 20 04/14/2022   NA 137 04/14/2022   K 4.1 04/14/2022   CL 97 (L) 04/14/2022   CO2 28 04/14/2022    Lab Results  Component Value Date   ALT 5 (A) 03/22/2020   AST 22 03/22/2020   ALKPHOS 192 (A) 03/22/2020   BILITOT 1.0 03/02/2020       03/04/2020, [facility] 04/15/2022, 4:14 PM

## 2022-04-15 NOTE — Progress Notes (Signed)
Occupational Therapy Treatment Patient Details Name: [name] MRN: [id number] DOB: September 22, 1961 Today's Date: 04/15/2022   History of present illness Diannah I Keil is a 61 y.o. female who underwent thoracic ten to Lumbar three Posterior lateral arthrodesis with screws 03/22/22. S/p thoracic wound exploration with repair of CSF leak 6/24.  S/p lumbar wound debridement on 04/08/22 due to infection.  PMH: anemia, anxiety, fibromyalgia, GERD, HTN, hyperthyroidism, IBS, ACDF 3/4   OT comments  Patient seated in recliner and agreeable to OT session.  Notable spastic jerking of BUEs, as well as trunk (when sitting unsupported) at rest and when functionally completing ADLs. Propped R UE on pillows and provided built up toothbrush to improve independence with task, but R UE continuously jerking away from her mouth during task.  Educated on compensatory techniques using red built up handle for self feeding and handled/lidded cup with straw for drinking. Pt concerned about this, RN aware. OT to re-evaluate goals next session pending progress.    Recommendations for follow up therapy are one component of a multi-disciplinary discharge planning process, led by the attending physician.  Recommendations may be updated based on patient status, additional functional criteria and insurance authorization.    Follow Up Recommendations  Home health OT    Assistance Recommended at Discharge Frequent or constant Supervision/Assistance  Patient can return home with the following  A lot of help with walking and/or transfers;A lot of help with bathing/dressing/bathroom;Assistance with cooking/housework;Assist for transportation;Help with stairs or ramp for entrance;Direct supervision/assist for medications management;Direct supervision/assist for financial management   Equipment Recommendations  None recommended by OT    Recommendations for Other Services      Precautions / Restrictions Precautions Precautions:  Back;Fall Precaution Comments: cueing for back precautions funcitonally Required Braces or Orthoses:  (no brace needed) Restrictions Weight Bearing Restrictions: No Other Position/Activity Restrictions: no brace needed       Mobility Bed Mobility               General bed mobility comments: OOB in recliner upon entry    Transfers                   General transfer comment: OOB in relciner     Balance Overall balance assessment: Needs assistance Sitting-balance support: Feet supported, No upper extremity supported Sitting balance-[name]: Fair                                     ADL either performed or assessed with clinical judgement   ADL Overall ADL's : Needs assistance/impaired   Eating/Feeding Details (indicate cue type and reason): provided red foam handle to assist with self feeding, educated on propping up UE on pillows/blankets to provide support; pt spilling with regular cup, provided handled cup and encouraged use of straw Grooming: Minimal assistance;Sitting Grooming Details (indicate cue type and reason): sitting in recliner, limited by B UE spastic jerking of UEs; improved grasp with built up handle, support under elbow but remains with limited coordination of task.                                    Extremity/Trunk Assessment Upper Extremity Assessment Upper Extremity Assessment: RUE deficits/detail;LUE deficits/detail RUE Deficits / Details: hx of neuropathy, ROM and MMT WFL but noted spastic jerking of UE throughout session RUE Sensation: decreased  light touch RUE Coordination: decreased fine motor;decreased gross motor LUE Deficits / Details: hx of neuropathy, ROM and MMT WFL but noted spastic jerking of UE throughout session LUE Sensation: decreased light touch LUE Coordination: decreased fine motor;decreased gross motor            Vision       Perception     Praxis      Cognition  Arousal/Alertness: Awake/alert Behavior During Therapy: WFL for tasks assessed/performed Overall Cognitive Status: No family/caregiver present to determine baseline cognitive functioning Area of Impairment: Memory, Attention, Problem solving, Safety/judgement                   Current Attention Level: Selective Memory: Decreased recall of precautions, Decreased short-term memory   Safety/Judgement: Decreased awareness of safety, Decreased awareness of deficits   Problem Solving: Slow processing, Difficulty sequencing, Requires verbal cues General Comments: pt requiring cueing to adhere to back preacutions (even with just sitting in recliner during ADLs), RN reports just recieving pain medication and at times slow to respond.        Exercises      Shoulder Instructions       General Comments      Pertinent Vitals/ Pain       Pain Assessment Pain Assessment: Faces Faces Pain Scale: Hurts little more Pain Location: generalized Pain Descriptors / Indicators: Discomfort, Grimacing, Operative site guarding Pain Intervention(s): Limited activity within patient's tolerance, Monitored during session, Repositioned, Premedicated before session  Home Living                                          Prior Functioning/Environment              Frequency  Min 2X/week        Progress Toward Goals  OT Goals(current goals can now be found in the care plan section)  Progress towards OT goals: OT to reassess next treatment  Acute Rehab OT Goals Patient Stated Goal: get better OT Goal Formulation: With patient Time For Goal Achievement: 04/17/22 Potential to Achieve Goals: Good  Plan Discharge plan remains appropriate;Frequency remains appropriate    Co-evaluation                 AM-PAC OT "6 Clicks" Daily Activity     Outcome Measure   Help from another person eating meals?: A Little Help from another person taking care of personal  grooming?: A Little Help from another person toileting, which includes using toliet, bedpan, or urinal?: A Little Help from another person bathing (including washing, rinsing, drying)?: A Little Help from another person to put on and taking off regular upper body clothing?: A Little Help from another person to put on and taking off regular lower body clothing?: A Little 6 Click Score: 18    End of Session    OT Visit Diagnosis: Unsteadiness on feet (R26.81);Muscle weakness (generalized) (M62.81);Pain   Activity Tolerance Patient tolerated treatment well   Patient Left in chair;with call bell/phone within reach;with chair alarm set;with nursing/sitter in room   Nurse Communication Mobility status;Other (comment) (B UE spastic jerking)        Time: 5643-3295 OT Time Calculation (min): 27 min  Charges: OT General Charges $OT Visit: 1 Visit OT Treatments $Self Care/Home Management : 23-37 mins  Barry Brunner, OT Acute Rehabilitation Services Office [phone]   Chancy Milroy 04/15/2022,  1:55 PM

## 2022-04-16 ENCOUNTER — Inpatient Hospital Stay (HOSPITAL_COMMUNITY): Payer: Medicare Other

## 2022-04-16 NOTE — Progress Notes (Signed)
[facility]  Date of Admission:  03/22/2022   Total days of inpatient antibiotics 11  Principal Problem:   Spondylolisthesis of lumbar region Active Problems:   Scoliosis due to degenerative disease of spine in adult patient   Postoperative wound infection          Assessment: 60 YF with MSSA HW associated lumbar wound infection completed suppressive keflex may 2021 admitted for L1/2 arthrodesis c/b wound drainage requiring I&D with cx+ enterobacter cloacae    #Postop op wound with retained HWR #Hx of MSSA lumbar hardware infection #Fever-resolved -6/16->T10 to L3 posterior later arthrodesis -Developed profuse drainage from wound -OR io 624 for thoracic wound exploration with repair of csf leak. Noted to have csf leak from dural opening, pice of fat placed ober  then dural sealant used. Opening could not be primarily sealed -Pt fevered on 6/30 -MRI on 7/3 showed fluid collection tracking along left posterior spinal rod from lower T spine to l4. Collection crosses midline at lumbar laminectomy space L1-L2 communicating with superficial fluid -OR on 7/3 for lumbar debridement. Noted to have pus through out length and depth on incision. Sponge placed on either side of wound -OR Cx+ Enterobacter, pt is now on cefepime Recommendations:  -Continue cefepime, anticipate 8 weeks from last I&D -Needs suppresive antibioics for retained hardware(she does not recall her allergy to bactrim) -Spoke with Dr. Mikal Plane and no plans for OR prior to discharge. -Will get CT head, MRI C/T/L in the setting of CSF leak followed by infected lumbar wound.    Microbiology:   Antibiotics: Cefepime 7/6-p Ceftriaxone 7/3-7/5 Vancomycin 6/30-7/3 Cultures: Blood 6/30 ng 7/5 ng Urine 6/30 urine cx >100,000 ecoli Other 7/3 csf cx enterobacter cloacae SUBJECTIVE: Resting in bed. No new complaints.   Review of Systems: Review of Systems  All other systems reviewed  and are negative.    Scheduled Meds:  ascorbic acid  500 mg Oral Daily   aspirin EC  81 mg Oral Daily   atenolol  50 mg Oral Daily   celecoxib  200 mg Oral BID   Chlorhexidine Gluconate Cloth  6 each Topical Q0600   chlorzoxazone  500 mg Oral TID   cyclobenzaprine  10 mg Oral Q8H   furosemide  40 mg Oral Q24H   gabapentin  1,200 mg Oral TID   heparin injection (subcutaneous)  5,000 Units Subcutaneous Q8H   hydrochlorothiazide  12.5 mg Oral Daily   hydrOXYzine  50 mg Oral BID   lubiprostone  48 mcg Oral QAC supper   multivitamin with minerals  1 tablet Oral Daily   oxyCODONE  40 mg Oral Q12H   pantoprazole  40 mg Oral Daily   potassium chloride  10 mEq Oral Daily   sodium chloride flush  3 mL Intravenous Q12H   Continuous Infusions:  sodium chloride Stopped (03/22/22 1815)   ceFEPime (MAXIPIME) IV 2 g (04/16/22 1517)   PRN Meds:.acetaminophen **OR** acetaminophen, albuterol, bisacodyl, diazepam, diphenhydrAMINE, guaiFENesin, hydrocortisone cream, menthol-cetylpyridinium **OR** phenol, morphine injection, ondansetron **OR** ondansetron (ZOFRAN) IV, mouth rinse, oxyCODONE, oxyCODONE, polyvinyl alcohol, senna, sodium chloride flush, zolpidem Allergies  Allergen Reactions   Monosodium Glutamate Anaphylaxis and Swelling    Eyes swollen shut, facial swelling, tongue swelling.   Shellfish Allergy Anaphylaxis   Baclofen Other (See Comments)   Celebrex [Celecoxib] Itching    Only allergic to generic brand   Contrast Media [Iodinated Contrast Media] Itching and Nausea Only    "could  not walk"   Diclofenac Itching    Generic Diclefenac gel causes itching. Can take the name brand Voltaren gel   Molds & Smuts Other (See Comments)   Other Other (See Comments)    Pet dander, Mildew, Mold  Cannot tolerate generic DICLOFENAC GEL--MUST USE BRAND NAME (GENERIC CAUSES ITCHING)    Sulfa Antibiotics    Latex Itching and Rash    Latex glove with powder    OBJECTIVE: Vitals:   04/15/22  2304 04/16/22 0355 04/16/22 0740 04/16/22 1153  BP: 120/62 110/67 121/65 136/66  Pulse: 75 66 64 67  Resp: 20 13 17 16   Temp: 98.7 F (37.1 C) 97.7 F (36.5 C) 98 F (36.7 C) 98.2 F (36.8 C)  TempSrc: Oral Oral Oral Oral  SpO2: 97% 93% 97% 100%  Weight:      Height:       Body mass index is 36.67 kg/m.  Physical Exam Constitutional:      Appearance: Normal appearance.  HENT:     Head: Normocephalic and atraumatic.     Right Ear: Tympanic membrane normal.     Left Ear: Tympanic membrane normal.     Nose: Nose normal.     Mouth/Throat:     Mouth: Mucous membranes are moist.  Eyes:     Extraocular Movements: Extraocular movements intact.     Conjunctiva/sclera: Conjunctivae normal.     Pupils: Pupils are equal, round, and reactive to light.  Cardiovascular:     Rate and Rhythm: Normal rate and regular rhythm.     Heart sounds: No murmur heard.    No friction rub. No gallop.  Pulmonary:     Effort: Pulmonary effort is normal.     Breath sounds: Normal breath sounds.  Abdominal:     General: Abdomen is flat.     Palpations: Abdomen is soft.  Musculoskeletal:     Comments: Back wound packed  Skin:    General: Skin is warm and dry.  Neurological:     General: No focal deficit present.     Mental Status: She is alert and oriented to person, place, and time.  Psychiatric:        Mood and Affect: Mood normal.       Lab Results Lab Results  Component Value Date   WBC 13.4 (H) 04/14/2022   HGB 8.2 (L) 04/14/2022   HCT 28.8 (L) 04/14/2022   MCV 73.7 (L) 04/14/2022   PLT 514 (H) 04/14/2022    Lab Results  Component Value Date   CREATININE 0.65 04/14/2022   BUN 20 04/14/2022   NA 137 04/14/2022   K 4.1 04/14/2022   CL 97 (L) 04/14/2022   CO2 28 04/14/2022    Lab Results  Component Value Date   ALT 5 (A) 03/22/2020   AST 22 03/22/2020   ALKPHOS 192 (A) 03/22/2020   BILITOT 1.0 03/02/2020        03/04/2020, MD [facility] 04/16/2022, 3:52 PM

## 2022-04-16 NOTE — Progress Notes (Signed)
Patient ID: [name], female   DOB: [birth year], 61 y.o.   MRN: [id number] BP 136/66 (BP Location: Right Arm)   Pulse 67   Temp 98.2 F (36.8 C) (Oral)   Resp 16   Ht 5\' 3"  (1.6 m)   Wt 93.9 kg   LMP 11/20/2012   SpO2 100%   BMI 36.67 kg/m  By report dressing changed as bed sAturated. ID has ordered numerous scans to aid in there care. Fairly clear Ms. Rickert does not have meningitis, nor evidence of long tract signs causing difficulties in the lower extremities. Will change dressing tomorrow. Not available at this time due to aforementioned imaging.

## 2022-04-17 NOTE — Progress Notes (Signed)
Mobility Specialist Progress Note   04/17/22 1540  Mobility  Activity Stood at bedside  Level of Assistance Minimal assist, patient does 75% or more  Assistive Device Front wheel walker  Activity Response Tolerated well  $Mobility charge 1 Mobility   Pt received in bed and agreeable to mobility. C/o slight back pain they rated 4/10 but had just received medicine from RN. Able to get EOB w/ hand held minA. Also able to stand x7 w/ minA before ortho surgeons interrupted session to change dressing. Pt left in the company of RN, IT trainer and other staff member. Will f/u tomorrow    Frederico Hamman Mobility Specialist MS [facility] #:  [phone] Acute Rehab Office:  [phone]

## 2022-04-17 NOTE — Progress Notes (Signed)
Patient ID: [name], female   DOB: November 02, 1960, 61 y.o.   MRN: [id number] BP 130/70 (BP Location: Right Arm)   Pulse 70   Temp 97.7 F (36.5 C) (Oral)   Resp 15   Ht 5\' 3"  (1.6 m)   Wt 93.9 kg   LMP 11/20/2012   SpO2 99%   BMI 36.67 kg/m  New dressing placed with wound vac Wound bed appears healthy, pink tissue Moving all extremities well All studies normal.  Plan on home for two weeks, then admit for secondary wound closure

## 2022-04-17 NOTE — Progress Notes (Signed)
[facility]  Date of Admission:  03/22/2022   Total days of inpatient antibiotics 11  Principal Problem:   Spondylolisthesis of lumbar region Active Problems:   Scoliosis due to degenerative disease of spine in adult patient   Postoperative wound infection          Assessment: 60 YF with MSSA HW associated lumbar wound infection completed suppressive keflex may 2021 admitted for L1/2 arthrodesis c/b wound drainage requiring I&D with cx+ enterobacter cloacae    #Postop op lumbar wound with retained HWR with Cx+ Enterobacter cloacae #Hx of MSSA lumbar hardware infection #Fever-resolved -6/16->T10 to L3 posterior later arthrodesis -Developed profuse drainage from wound -OR io 624 for thoracic wound exploration with repair of csf leak. Noted to have csf leak from dural opening, pice of fat placed ober  then dural sealant used. Opening could not be primarily sealed -Pt fevered on 6/30 -MRI on 7/3 showed fluid collection tracking along left posterior spinal rod from lower T spine to l4. Collection crosses midline at lumbar laminectomy space L1-L2 communicating with superficial fluid -OR on 7/3 for lumbar debridement. Noted to have pus through out length and depth on incision. Sponge placed on either side of wound -OR Cx+ Enterobacter, pt is now on cefepime -Spoke with Dr. Cyndy Freeze and no plans for OR prior to discharge. -Pt c/o lower extremity weakness. In the setting of CSF leak, and hardware assoicated infection ordered CT head and MR C/T/L showed large posteriori soft tissue defect from lower T spine to L2 c/w wound debridement.  Recommendations:  -Place PICC -Continue cefepime, anticipate 8 weeks from last I&D -Needs suppresive antibioics for retained hardware(she does not recall her allergy to bactrim) -Follow-up in ID clinic OPAT ORDERS:  Diagnosis: Postop op lumbar wound with retained HWR with Cx+ Enterobacter cloacae  Culture Result: 7/3 OR  cx enterobacter cloacae  Allergies  Allergen Reactions   Monosodium Glutamate Anaphylaxis and Swelling    Eyes swollen shut, facial swelling, tongue swelling.   Shellfish Allergy Anaphylaxis   Baclofen Other (See Comments)   Celebrex [Celecoxib] Itching    Only allergic to generic brand   Contrast Media [Iodinated Contrast Media] Itching and Nausea Only    "could not walk"   Diclofenac Itching    Generic Diclefenac gel causes itching. Can take the name brand Voltaren gel   Molds & Smuts Other (See Comments)   Other Other (See Comments)    Pet dander, Mildew, Mold  Cannot tolerate generic DICLOFENAC GEL--MUST USE BRAND NAME (GENERIC CAUSES ITCHING)    Sulfa Antibiotics    Latex Itching and Rash    Latex glove with powder     Discharge antibiotics to be given via PICC line:  Per pharmacy protocol cefepime 2gm q8h Aim for Vancomycin trough 15-20 or AUC 400-550 (unless otherwise indicated)   Duration: 8 weeks End Date: 8/27  [facility] Care Per Protocol with [employer] Use: Home health RN for IV administration and teaching, line care and labs.    Labs weekly while on IV antibiotics: __ CBC with differential  __ CMP __ CRP __ ESR   __ Please pull PIC at completion of IV antibiotics   Fax weekly labs to [phone]  Clinic Follow Up Appt: 7/28  @ [facility] with Dr. Candiss Norse    Microbiology:   Antibiotics: Cefepime 7/6-p Ceftriaxone 7/3-7/5 Vancomycin 6/30-7/3 Cultures: Blood 6/30 ng 7/5 ng Urine 6/30 urine cx >100,000 ecoli Other 7/3 OR cx enterobacter  cloacae SUBJECTIVE: In chair reports LE weakness for about 1.5 weeks  Review of Systems: Review of Systems  All other systems reviewed and are negative.    Scheduled Meds:  ascorbic acid  500 mg Oral Daily   aspirin EC  81 mg Oral Daily   atenolol  50 mg Oral Daily   celecoxib  200 mg Oral BID   Chlorhexidine Gluconate Cloth  6 each Topical Q0600   chlorzoxazone  500 mg Oral TID   cyclobenzaprine  10  mg Oral Q8H   furosemide  40 mg Oral Q24H   gabapentin  1,200 mg Oral TID   heparin injection (subcutaneous)  5,000 Units Subcutaneous Q8H   hydrochlorothiazide  12.5 mg Oral Daily   hydrOXYzine  50 mg Oral BID   lubiprostone  48 mcg Oral QAC supper   multivitamin with minerals  1 tablet Oral Daily   oxyCODONE  40 mg Oral Q12H   pantoprazole  40 mg Oral Daily   potassium chloride  10 mEq Oral Daily   sodium chloride flush  3 mL Intravenous Q12H   Continuous Infusions:  sodium chloride Stopped (03/22/22 1815)   ceFEPime (MAXIPIME) IV 2 g (04/17/22 0604)   PRN Meds:.acetaminophen **OR** acetaminophen, albuterol, bisacodyl, diazepam, diphenhydrAMINE, guaiFENesin, hydrocortisone cream, menthol-cetylpyridinium **OR** phenol, morphine injection, ondansetron **OR** ondansetron (ZOFRAN) IV, mouth rinse, oxyCODONE, oxyCODONE, polyvinyl alcohol, senna, sodium chloride flush, zolpidem Allergies  Allergen Reactions   Monosodium Glutamate Anaphylaxis and Swelling    Eyes swollen shut, facial swelling, tongue swelling.   Shellfish Allergy Anaphylaxis   Baclofen Other (See Comments)   Celebrex [Celecoxib] Itching    Only allergic to generic brand   Contrast Media [Iodinated Contrast Media] Itching and Nausea Only    "could not walk"   Diclofenac Itching    Generic Diclefenac gel causes itching. Can take the name brand Voltaren gel   Molds & Smuts Other (See Comments)   Other Other (See Comments)    Pet dander, Mildew, Mold  Cannot tolerate generic DICLOFENAC GEL--MUST USE BRAND NAME (GENERIC CAUSES ITCHING)    Sulfa Antibiotics    Latex Itching and Rash    Latex glove with powder    OBJECTIVE: Vitals:   04/16/22 2302 04/17/22 0307 04/17/22 0741 04/17/22 1124  BP: (!) 96/56 99/60 130/70 123/63  Pulse: 75 74 70 67  Resp: 17 16 15 13   Temp: 98.4 F (36.9 C) 97.9 F (36.6 C) 97.7 F (36.5 C) 98.2 F (36.8 C)  TempSrc: Oral Oral Oral Oral  SpO2: 94% 98% 99% 94%  Weight:       Height:       Body mass index is 36.67 kg/m.  Physical Exam Constitutional:      Appearance: Normal appearance.  HENT:     Head: Normocephalic and atraumatic.     Right Ear: Tympanic membrane normal.     Left Ear: Tympanic membrane normal.     Nose: Nose normal.     Mouth/Throat:     Mouth: Mucous membranes are moist.  Eyes:     Extraocular Movements: Extraocular movements intact.     Conjunctiva/sclera: Conjunctivae normal.     Pupils: Pupils are equal, round, and reactive to light.  Cardiovascular:     Rate and Rhythm: Normal rate and regular rhythm.     Heart sounds: No murmur heard.    No friction rub. No gallop.  Pulmonary:     Effort: Pulmonary effort is normal.     Breath sounds: Normal breath  sounds.  Abdominal:     General: Abdomen is flat.     Palpations: Abdomen is soft.  Musculoskeletal:     Comments: Back wound packed  Skin:    General: Skin is warm and dry.  Neurological:     General: No focal deficit present.     Mental Status: She is alert and oriented to person, place, and time.  Psychiatric:        Mood and Affect: Mood normal.       Lab Results Lab Results  Component Value Date   WBC 13.4 (H) 04/14/2022   HGB 8.2 (L) 04/14/2022   HCT 28.8 (L) 04/14/2022   MCV 73.7 (L) 04/14/2022   PLT 514 (H) 04/14/2022    Lab Results  Component Value Date   CREATININE 0.65 04/14/2022   BUN 20 04/14/2022   NA 137 04/14/2022   K 4.1 04/14/2022   CL 97 (L) 04/14/2022   CO2 28 04/14/2022    Lab Results  Component Value Date   ALT 5 (A) 03/22/2020   AST 22 03/22/2020   ALKPHOS 192 (A) 03/22/2020   BILITOT 1.0 03/02/2020        Laurice Record, MD [facility] 04/17/2022, 1:18 PM

## 2022-04-17 NOTE — Progress Notes (Signed)
Physical Therapy Treatment Patient Details Name: [name] MRN: [id number] DOB: Jul 22, 1961 Today's Date: 04/17/2022   History of Present Illness [name] is a 61 y.o. female who underwent thoracic ten to Lumbar three Posterior lateral arthrodesis with screws 03/22/22. S/p thoracic wound exploration with repair of CSF leak 6/24.  S/p lumbar wound debridement on 04/08/22 due to infection.  PMH: anemia, anxiety, fibromyalgia, GERD, HTN, hyperthyroidism, IBS, ACDF 3/4    PT Comments    Pt admitted with above diagnosis. Pt was able to transfer chair to bed with min assist of 2 for safety as her spasms appear better however she was too fatigued today to walk.  Pt did do some exercises prior to laying back down.  Pt currently with functional limitations due to balance and enduracne deficits. Pt will benefit from skilled PT to increase their independence and safety with mobility to allow discharge to the venue listed below.      Recommendations for follow up therapy are one component of a multi-disciplinary discharge planning process, led by the attending physician.  Recommendations may be updated based on patient status, additional functional criteria and insurance authorization.  Follow Up Recommendations  Skilled nursing-short term rehab (<3 hours/day) (will need 24 hour care if home and HHPT,HHOT and HHAide) Can patient physically be transported by private vehicle: No   Assistance Recommended at Discharge Intermittent Supervision/Assistance  Patient can return home with the following A little help with bathing/dressing/bathroom;Help with stairs or ramp for entrance;Assist for transportation;Assistance with cooking/housework;A lot of help with walking and/or transfers   Equipment Recommendations  None recommended by PT    Recommendations for Other Services       Precautions / Restrictions Precautions Precautions: Back;Fall Precaution Booklet Issued: Yes (comment) Precaution  Comments: cueing for back precautions funcitonally Required Braces or Orthoses:  (no brace needed) Restrictions Weight Bearing Restrictions: No Other Position/Activity Restrictions: no brace needed     Mobility  Bed Mobility Overal bed mobility: Needs Assistance Bed Mobility: Sit to Supine       Sit to supine: Min guard, Min assist   General bed mobility comments: A little assist for LEs back into bed    Transfers Overall transfer level: Needs assistance Equipment used: Rolling walker (2 wheels) Transfers: Sit to/from Stand Sit to Stand: Min assist, +2 safety/equipment, From elevated surface   Step pivot transfers: +2 physical assistance, Min assist       General transfer comment: Pt needed min assist to power up.  Pt with decr in spasms of UE and LEs causing with pt a little more stability with baalnce in standing. Was able to step around to chair and march in place 20 x 2 however close guard for safety. Pt declined to walk as she states she had been in chair for hours and was just too fatigued.    Ambulation/Gait                   Stairs             Wheelchair Mobility    Modified Rankin (Stroke Patients Only)       Balance Overall balance assessment: Needs assistance Sitting-balance support: Feet supported, No upper extremity supported Sitting balance-[name]: Fair     Standing balance support: During functional activity, Bilateral upper extremity supported, Reliant on assistive device for balance Standing balance-[name]: Poor Standing balance comment: relies on RW for balance in static standing  Cognition Arousal/Alertness: Awake/alert Behavior During Therapy: WFL for tasks assessed/performed Overall Cognitive Status: No family/caregiver present to determine baseline cognitive functioning Area of Impairment: Memory, Attention, Problem solving, Safety/judgement                   Current  Attention Level: Selective Memory: Decreased recall of precautions, Decreased short-term memory   Safety/Judgement: Decreased awareness of safety, Decreased awareness of deficits   Problem Solving: Slow processing, Difficulty sequencing, Requires verbal cues General Comments: Pt slow to follow commands at times.        Exercises General Exercises - Lower Extremity Long Arc Quad: AROM, Both, 15 reps, Seated Hip Flexion/Marching: AROM, 20 reps, Standing    General Comments        Pertinent Vitals/Pain Pain Assessment Pain Assessment: Faces Faces Pain Scale: Hurts even more Pain Location: generalized Pain Descriptors / Indicators: Discomfort, Grimacing, Operative site guarding Pain Intervention(s): Limited activity within patient's tolerance, Monitored during session, Repositioned    Home Living                          Prior Function            PT Goals (current goals can now be found in the care plan section) Progress towards PT goals: Progressing toward goals    Frequency    Min 3X/week      PT Plan Current plan remains appropriate    Co-evaluation              AM-PAC PT "6 Clicks" Mobility   Outcome Measure  Help needed turning from your back to your side while in a flat bed without using bedrails?: A Little Help needed moving from lying on your back to sitting on the side of a flat bed without using bedrails?: A Little Help needed moving to and from a bed to a chair (including a wheelchair)?: Total Help needed standing up from a chair using your arms (e.g., wheelchair or bedside chair)?: Total Help needed to walk in hospital room?: Total Help needed climbing 3-5 steps with a railing? : Total 6 Click Score: 10    End of Session Equipment Utilized During Treatment: Gait belt Activity Tolerance: Patient limited by fatigue;Patient limited by pain (limited by UE and LE spasms) Patient left: with call bell/phone within reach;in bed Nurse  Communication: Mobility status;Need for lift equipment PT Visit Diagnosis: Difficulty in walking, not elsewhere classified (R26.2);Muscle weakness (generalized) (M62.81);Unsteadiness on feet (R26.81);Other abnormalities of gait and mobility (R26.89)     Time: 1601-0932 PT Time Calculation (min) (ACUTE ONLY): 20 min  Charges:  $Therapeutic Activity: 8-22 mins                     [facility] M,PT Acute Rehab Services [phone]    Bevelyn Buckles 04/17/2022, 2:24 PM

## 2022-04-17 NOTE — Progress Notes (Signed)
PHARMACY CONSULT NOTE FOR:  OUTPATIENT  PARENTERAL ANTIBIOTIC THERAPY (OPAT)  Indication: Enterobacter cloacae lumbar wound infection  Regimen: Cefepime 2 gm IV Q 8 hours  End date: 06/02/22   IV antibiotic discharge orders are pended. To discharging provider:  please sign these orders via discharge navigator,  Select New Orders & click on the button choice - Manage This Unsigned Work.     Thank you for allowing pharmacy to be a part of this patient's care.  Sharin Mons, PharmD, BCPS, BCIDP Infectious Diseases Clinical Pharmacist Phone: [phone] 04/17/2022, 10:50 AM

## 2022-04-17 NOTE — Progress Notes (Signed)
Occupational Therapy Treatment Patient Details Name: [name] MRN: [id number] DOB: 1961/02/17 Today's Date: 04/17/2022   History of present illness [name] is a 61 y.o. female who underwent thoracic ten to Lumbar three Posterior lateral arthrodesis with screws 03/22/22. S/p thoracic wound exploration with repair of CSF leak 6/24.  S/p lumbar wound debridement on 04/08/22 due to infection.  PMH: anemia, anxiety, fibromyalgia, GERD, HTN, hyperthyroidism, IBS, ACDF 3/4   OT comments  Patient with increasing difficulty with basic mobility and ADL performance compared to initial OT evaluation.  Patient is limited more so by back pain and a newer onset of sudden jerking type movements, where she is having difficulty supporting herself in sitting or standing.  Patient declining out of the bed, as she was up earlier, but the change of care plan, and updating discharge recommendation discussed.  OT will follow in the acute setting with SNF being recommended for post acute rehab.  Patient does not have the needed 24 hour care to transition directly home.     Recommendations for follow up therapy are one component of a multi-disciplinary discharge planning process, led by the attending physician.  Recommendations may be updated based on patient status, additional functional criteria and insurance authorization.    Follow Up Recommendations  Skilled nursing-short term rehab (<3 hours/day)    Assistance Recommended at Discharge Frequent or constant Supervision/Assistance  Patient can return home with the following  A lot of help with walking and/or transfers;A lot of help with bathing/dressing/bathroom;Assistance with cooking/housework;Assist for transportation;Help with stairs or ramp for entrance;Direct supervision/assist for medications management;Direct supervision/assist for financial management   Equipment Recommendations  None recommended by OT    Recommendations for Other Services       Precautions / Restrictions Precautions Precautions: Back;Fall Precaution Booklet Issued: Yes (comment) Precaution Comments: cueing for back precautions funcitonally Required Braces or Orthoses:  (no brace needed) Restrictions Weight Bearing Restrictions: No Other Position/Activity Restrictions: no brace needed       Mobility Bed Mobility Overal bed mobility: Needs Assistance Bed Mobility: Sidelying to Sit, Sit to Sidelying, Rolling Rolling: Supervision Sidelying to sit: Min assist     Sit to sidelying: Min assist      Transfers                   General transfer comment: declined OOB again     Balance Overall balance assessment: Needs assistance Sitting-balance support: Feet supported, No upper extremity supported Sitting balance-[name]: Fair                                     ADL either performed or assessed with clinical judgement   ADL Overall ADL's : Needs assistance/impaired     Grooming: Minimal assistance;Sitting           Upper Body Dressing : Moderate assistance;Sitting   Lower Body Dressing: Maximal assistance;Sitting/lateral leans                      Extremity/Trunk Assessment Upper Extremity Assessment RUE Deficits / Details: hx of neuropathy, ROM and MMT WFL but noted spastic jerking of UE throughout session: 6/12 continues RUE Sensation: decreased light touch RUE Coordination: decreased fine motor;decreased gross motor LUE Deficits / Details: hx of neuropathy, ROM and MMT WFL but noted spastic jerking of UE throughout session: 6/12 continues with occasional jerking movements, more so on R UE  this date. LUE Sensation: decreased light touch LUE Coordination: decreased fine motor;decreased gross motor   Lower Extremity Assessment Lower Extremity Assessment: Defer to PT evaluation                          Cognition Arousal/Alertness: Awake/alert Behavior During Therapy: WFL for tasks  assessed/performed                         Memory: Decreased recall of precautions, Decreased short-term memory   Safety/Judgement: Decreased awareness of safety, Decreased awareness of deficits   Problem Solving: Requires verbal cues, Difficulty sequencing                             Pertinent Vitals/ Pain       Pain Assessment Pain Assessment: Faces Faces Pain Scale: Hurts even more Pain Location: back and legs Pain Descriptors / Indicators: Grimacing, Guarding Pain Intervention(s): Monitored during session                                                          Frequency  Min 2X/week        Progress Toward Goals  OT Goals(current goals can now be found in the care plan section)  Progress towards OT goals: Goals drowngraded-see care plan  Acute Rehab OT Goals OT Goal Formulation: With patient Time For Goal Achievement: 05/01/22 Potential to Achieve Goals: Good ADL Goals Pt Will Perform Lower Body Bathing: with min guard assist;sit to/from stand Pt Will Perform Lower Body Dressing: with min guard assist;sit to/from stand;with adaptive equipment Pt Will Transfer to Toilet: with min guard assist;ambulating;regular height toilet Additional ADL Goal #1: Pt will complete bed mobility with log roll technique with Min Guard and no verbal cues as a precursor to ADL completion  Plan Frequency remains appropriate;Discharge plan needs to be updated    Co-evaluation                 AM-PAC OT "6 Clicks" Daily Activity     Outcome Measure   Help from another person eating meals?: A Little Help from another person taking care of personal grooming?: A Little Help from another person toileting, which includes using toliet, bedpan, or urinal?: A Lot Help from another person bathing (including washing, rinsing, drying)?: A Lot Help from another person to put on and taking off regular upper body clothing?: A Lot Help from  another person to put on and taking off regular lower body clothing?: A Lot 6 Click Score: 14    End of Session    OT Visit Diagnosis: Unsteadiness on feet (R26.81);Muscle weakness (generalized) (M62.81);Pain   Activity Tolerance Patient limited by pain   Patient Left in bed;with call bell/phone within reach;with bed alarm set   Nurse Communication Mobility status        Time: 5170-0174 OT Time Calculation (min): 16 min  Charges: OT General Charges $OT Visit: 1 Visit OT Treatments $Therapeutic Activity: 8-22 mins  04/17/2022  RP, OTR/L  Acute Rehabilitation Services  Office:  [phone]   [name] 04/17/2022, 4:20 PM

## 2022-04-18 NOTE — TOC Progression Note (Signed)
Transition of Care [facility]) - Progression Note    Patient Details  Name: [name] MRN: [id number] Date of Birth: July 05, 1961  [name]) CM/SW Contact  Beckie Busing, RN Phone Number:[phone]  04/18/2022, 3:50 PM  Clinical Narrative:    TOC continues to follow for patient with SNF recs but refusing and wants to go home with home health and IV antibiotics. Patient now has wound vac. TOC will follow to discuss options with patient.    Expected Discharge Plan: Home w Home Health Services Barriers to Discharge: Continued Medical Work up  Expected Discharge Plan and Services Expected Discharge Plan: Home w Home Health Services In-house Referral: Clinical Social Work Discharge Planning Services: CM Consult Post Acute Care Choice: Home Health Living arrangements for the past 2 months: Single Family Home                           HH Arranged: PT, OT, Social Work, Disease Management HH Agency: [facility] Date HH Agency Contacted: 04/04/22 Time HH Agency Contacted: 1245 Representative spoke with at [facility] Agency: Morrie Sheldon   Social Determinants of Health (SDOH) Interventions    Readmission Risk Interventions     No data to display

## 2022-04-18 NOTE — Progress Notes (Signed)
Mobility Specialist Progress Note   04/18/22 1039  Mobility  Activity Transferred from bed to chair  Level of Assistance Contact guard assist, steadying assist  Assistive Device Front wheel walker  Distance Ambulated (ft) 2 ft  Activity Response Tolerated well  $Mobility charge 1 Mobility   Pt agreeable to get from bed to chair for lunch having no complaints. Required contact guard throuhotu w/ little to know tremors or buckling. Stand, pivot turn to chair w/o fault w/ the use of a RW for transfer. Pt left in chair w/ all needs met, call bell in reach and chair alarm on.  Holland Falling Mobility Specialist [place] #:  [phone] Acute Rehab Office:  [phone]

## 2022-04-18 NOTE — Progress Notes (Signed)
Patient ID: [name], female   DOB: 03/26/1961, 61 y.o.   MRN: [id number] BP 112/65 (BP Location: Right Arm)   Pulse 76   Temp 98.5 F (36.9 C) (Oral)   Resp 16   Ht 5\' 3"  (1.6 m)   Wt 93.9 kg   LMP 11/20/2012   SpO2 95%   BMI 36.67 kg/m  Alert and oriented x 4 Dressing in place Plan for two weeks with wound vac. Then readmission for wound closure.

## 2022-04-19 LAB — BASIC METABOLIC PANEL
Anion gap: 13 (ref 5–15)
BUN: 25 mg/dL — ABNORMAL HIGH (ref 6–20)
CO2: 25 mmol/L (ref 22–32)
Calcium: 9 mg/dL (ref 8.9–10.3)
Chloride: 101 mmol/L (ref 98–111)
Creatinine, Ser: 0.68 mg/dL (ref 0.44–1.00)
GFR, Estimated: >60 mL/min (ref >60)
Glucose, Bld: 130 mg/dL — ABNORMAL HIGH (ref 70–99)
Potassium: 3.2 mmol/L — ABNORMAL LOW (ref 3.5–5.1)
Sodium: 139 mmol/L (ref 135–145)

## 2022-04-19 NOTE — TOC Progression Note (Addendum)
Transition of Care [facility]) - Progression Note    Patient Details  Name: [name] MRN: [id number] Date of Birth: 09-25-1961  Transition of Care [facility]) CM/SW Contact  Beckie Busing, RN Phone Number:[phone]  04/19/2022, 1:29 PM  Clinical Narrative:    TOC following closely for discharge planning. CM at bedside to speak with patient. Patient states that she is from home alone where right now she does not have any assistance or teachable caregivers for IV abx infusions or wound care stating that she will need to work on that. Patient is alert and oriented time 4 and very appropriate in conversation. Patient states that she is not going home with anything that she did not come with. Patient goes further to explain that she is having "episodes of paralysis with her hands arms and legs". Patient describes the paralysis as episodes when her hands jerk and cause her to slam her arms downward. Cm inquired if patient has discussed this with the surgeon and patient states no she "cant get him to be still long enough." CM asked patient is she open to SNF . Patient then begins to talk about how she was at [facility] before and therapy never really did anything for her and that she never walked outside of the room. Patient states that " she wants to go home and she wants to go just like she walked into the hospital." Patient states that she is not planning on leaving until the physician determines the cause of the "paralysis episodes." CM has advised patient to speak with the physician about her concerns because the plan needs to be clearly defined and the hospital is not an indefinite option.   TOC attempting to determine if this patient is medically stable for discharge and if so what is the plan. MD please advise   Expected Discharge Plan: Home w Home Health Services Barriers to Discharge: Continued Medical Work up  Expected Discharge Plan and Services Expected Discharge Plan: Home w Home Health  Services In-house Referral: Clinical Social Work Discharge Planning Services: CM Consult Post Acute Care Choice: Home Health Living arrangements for the past 2 months: Single Family Home                           HH Arranged: PT, OT, Social Work, Disease Management HH Agency: [facility] Date HH Agency Contacted: 04/04/22 Time HH Agency Contacted: 1245 Representative spoke with at [facility] Agency: Morrie Sheldon   Social Determinants of Health (SDOH) Interventions    Readmission Risk Interventions     No data to display

## 2022-04-19 NOTE — Progress Notes (Signed)
Physical Therapy Treatment Patient Details Name: [name] MRN: [id number] DOB: [birth year] Today's Date: 04/19/2022   History of Present Illness 61 y.o. female admitted 6/16 for T10-L3 PLIF . 6/24 wound exploration with repair of CSF leak.  S/p lumbar I&D 04/08/22.  PMH: anemia, anxiety, fibromyalgia, GERD, HTN, hyperthyroidism, IBS, ACDF 3/4    PT Comments    Pt pleasant and continues to have spasms limiting mobility. Pt with initial RUE spasm with transition from side to sit making her lose control of trunk and reattempt rise. Partial knee buckling with spasm sitting to toilet and difficulty with bil UE spasms with attempting to eat. Pt able to progress gait and functional mobility this session with close chair follow for safety. Continue to recommend SNF at D/C until spasms no longer interfere with pt safety and function. Will continue to follow.     Recommendations for follow up therapy are one component of a multi-disciplinary discharge planning process, led by the attending physician.  Recommendations may be updated based on patient status, additional functional criteria and insurance authorization.  Follow Up Recommendations  Skilled nursing-short term rehab (<3 hours/day) Can patient physically be transported by private vehicle: Yes   Assistance Recommended at Discharge Intermittent Supervision/Assistance  Patient can return home with the following A little help with bathing/dressing/bathroom;Help with stairs or ramp for entrance;Assist for transportation;Assistance with cooking/housework;A little help with walking and/or transfers   Equipment Recommendations  None recommended by PT    Recommendations for Other Services       Precautions / Restrictions Precautions Precautions: Back;Fall Precaution Comments: VAC Restrictions Weight Bearing Restrictions: No     Mobility  Bed Mobility Overal bed mobility: Needs Assistance Bed Mobility: Rolling, Sidelying to  Sit Rolling: Supervision Sidelying to sit: Min guard       General bed mobility comments: pt able to roll to right and rise from side without physical assist    Transfers Overall transfer level: Needs assistance   Transfers: Sit to/from Stand Sit to Stand: Min guard           General transfer comment: cues for hand placement with guarding to rise from bed and BSC. PT with decreased control of descent to [facility] with LE spasm, smooth transition to sit in chair after gait    Ambulation/Gait Ambulation/Gait assistance: Min guard Gait Distance (Feet): 130 Feet Assistive device: Rolling walker (2 wheels) Gait Pattern/deviations: Step-through pattern, Decreased stride length   Gait velocity interpretation: <1.8 ft/sec, indicate of risk for recurrent falls   General Gait Details: cues for posture and position in RW, chair follow for safety due to spasm. Slow cautious gait with reliance on RW   Stairs             Wheelchair Mobility    Modified Rankin (Stroke Patients Only)       Balance Overall balance assessment: Needs assistance Sitting-balance support: Feet supported, No upper extremity supported Sitting balance-[name]: Fair Sitting balance - Comments: static sitting EOB   Standing balance support: During functional activity, Bilateral upper extremity supported, Reliant on assistive device for balance Standing balance-[name]: Poor Standing balance comment: stood at sink to wash hands, bil UE on RW for gait                            Cognition Arousal/Alertness: Awake/alert Behavior During Therapy: WFL for tasks assessed/performed Overall Cognitive Status: Impaired/Different from baseline  Safety/Judgement: Decreased awareness of safety, Decreased awareness of deficits   Problem Solving: Requires verbal cues General Comments: slow to respond to questions at times        Exercises      General  Comments        Pertinent Vitals/Pain Pain Assessment Pain Assessment: No/denies pain    Home Living                          Prior Function            PT Goals (current goals can now be found in the care plan section) Progress towards PT goals: Progressing toward goals    Frequency    Min 3X/week      PT Plan Current plan remains appropriate    Co-evaluation              AM-PAC PT "6 Clicks" Mobility   Outcome Measure  Help needed turning from your back to your side while in a flat bed without using bedrails?: A Little Help needed moving from lying on your back to sitting on the side of a flat bed without using bedrails?: A Little Help needed moving to and from a bed to a chair (including a wheelchair)?: A Little Help needed standing up from a chair using your arms (e.g., wheelchair or bedside chair)?: A Little Help needed to walk in hospital room?: A Lot Help needed climbing 3-5 steps with a railing? : Total 6 Click Score: 15    End of Session Equipment Utilized During Treatment: Gait belt Activity Tolerance: Patient tolerated treatment well Patient left: in chair;with call bell/phone within reach;with chair alarm set Nurse Communication: Mobility status PT Visit Diagnosis: Difficulty in walking, not elsewhere classified (R26.2);Muscle weakness (generalized) (M62.81);Unsteadiness on feet (R26.81);Other abnormalities of gait and mobility (R26.89)     Time: 0350-0938 PT Time Calculation (min) (ACUTE ONLY): 49 min  Charges:  $Gait Training: 23-37 mins $Therapeutic Activity: 8-22 mins                     [name], PT Acute Rehabilitation Services Office: [phone]    [name] [name] 04/19/2022, 9:55 AM

## 2022-04-19 NOTE — Progress Notes (Signed)
Patient ID: [name], female   DOB: 1960/11/08, 61 y.o.   MRN: [id number] BP (!) 114/57 (BP Location: Left Wrist)   Pulse 68   Temp 97.8 F (36.6 C) (Oral)   Resp 20   Ht 5\' 3"  (1.6 m)   Wt 93.9 kg   LMP 11/20/2012   SpO2 98%   BMI 36.67 kg/m  Alert and oriented x 4 Moving all extremities,reports ballistic movements in the upper extremities Will consult neurology tomorrow Working with PT Dressing in place

## 2022-04-19 NOTE — Progress Notes (Signed)
Mobility Specialist Progress Note   04/19/22 1417  Mobility  Activity Ambulated with assistance in hallway  Level of Assistance Minimal assist, patient does 75% or more  Assistive Device Front wheel walker  Distance Ambulated (ft) 420 ft  Activity Response Tolerated well  $Mobility charge 1 Mobility    Pre Mobility: 71 HR, 117/58 BP During Mobility: 83 HR Post Mobility: 68 HR    Patient received in bed agreeable to participate in mobility. Ambulated in hallway at contact guard w/ with steady gait. But upon returning to room pt began to have bouts of buckling causing pt to prematurely sit down on EOB. Once safely sat, returned supine in bed with all needs met and call bell in reach.    Holland Falling Mobility Specialist MS [facility] #:  [phone] Acute Rehab Office:  [phone]

## 2022-04-20 NOTE — Progress Notes (Signed)
Wound vac suction showing 0 mmgh , equipment replaced yesterday. KCL vac specialist contacted and recommended changing the dressing. NRSY contacted and will place orders. Output is 200 ml total in canister. Will continue to monitor.

## 2022-04-20 NOTE — Progress Notes (Signed)
NEUROSURGERY PROGRESS NOTE  Alert and oriented x4 MAE Complaining of spasms in upper extremities Dressing and wound vac in place.   Temp:  [97.8 F (36.6 C)-98.9 F (37.2 C)] 98.3 F (36.8 C) (07/15 0741) Pulse Rate:  [59-79] 59 (07/15 0741) Resp:  [13-20] 13 (07/15 0741) BP: (109-121)/(52-66) 111/66 (07/15 0741) SpO2:  [96 %-100 %] 97 % (07/15 0741)    Sherryl Manges, NP 04/20/2022 8:23 AM

## 2022-04-20 NOTE — Progress Notes (Signed)
She overall is doing fairly well and appears stable.  I see no jerking movements of the arms or legs.  VAC is in place.  Pure wick is in place.  She denies leg pain or numbness tingling or weakness.  Continue current management.

## 2022-04-21 NOTE — Progress Notes (Signed)
She appears to be doing okay and is stable.  No jerking movements of the arms or legs.  VAC is in place and was changed by Dr. Franky Macho personally yesterday.  She denies significant pain.  She denies numbness tingling or weakness.  Continue current management

## 2022-04-22 LAB — BASIC METABOLIC PANEL
Anion gap: 7 (ref 5–15)
BUN: 24 mg/dL — ABNORMAL HIGH (ref 6–20)
CO2: 27 mmol/L (ref 22–32)
Calcium: 9.1 mg/dL (ref 8.9–10.3)
Chloride: 104 mmol/L (ref 98–111)
Creatinine, Ser: 0.6 mg/dL (ref 0.44–1.00)
GFR, Estimated: >60 mL/min (ref >60)
Glucose, Bld: 91 mg/dL (ref 70–99)
Potassium: 3.7 mmol/L (ref 3.5–5.1)
Sodium: 138 mmol/L (ref 135–145)

## 2022-04-22 MED ORDER — METHOCARBAMOL 500 MG PO TABS
500.0000 mg | ORAL_TABLET | Freq: Four times a day (QID) | ORAL | Status: DC | PRN
  Administered 2022-04-23 – 2022-05-01 (×14): 500 mg via ORAL
  Filled 2022-04-22 (×14): qty 1

## 2022-04-22 NOTE — Progress Notes (Signed)
Patient ID: [name], female   DOB: 01/01/1961, 61 y.o.   MRN: [id number] BP (!) 143/75 (BP Location: Right Arm)   Pulse 75   Temp 98.5 F (36.9 C) (Oral)   Resp 17   Ht 5\' 3"  (1.6 m)   Wt 93.9 kg   LMP 11/20/2012   SpO2 94%   BMI 36.67 kg/m  Alert and oriented x 4 Moving all extremities Wound vac in place.  Respectfully stating a nursing home is not appropriate. Patient has not appreciated what she considers badgering and bullying by certain individuals. Understand she cannot go home by herself. Will speak with her to truly explore options.

## 2022-04-22 NOTE — TOC Progression Note (Addendum)
Transition of Care [facility]) - Progression Note    Patient Details  Name: [name] MRN: [id number] Date of Birth: 1961/06/19  [facility] CM/SW Contact  Beckie Busing, RN Phone Number:[phone]  04/22/2022, 2:18 PM  Clinical Narrative:    CM at bedside to follow up for discharge planning. Patient states that she did report her concerns about what she describes as "paralysis/ jerking episodes" with Dr. Franky Macho. Patient also states that Dr. Franky Macho is "trying to see how the IV antibiotics will work with the wound vac." Patient states that MD does not have a discharge plan right now. Patient states that she does not want to go home until MD can determine what caused her to have UTI and fevers. CM has reiterated that the hospital is not a long term plan. CM discussed the option of LTACH due to patient may be a candidate for LTACH. Patient is agreeable to discussing the idea of LTACH. CM has attempted to reach Dr. Franky Macho for [facility] referral and discussion for disposition planning. Message has been left for Dr. Joesphine Bare medical assistance. Will await return call.   MD please advise on disposition planning. Patient currently does not want SNF and does not have any support at home for a safe return. TOC has been unsuccessful with any disposition planning. We continue to respectfully ask is this patient medically ready for discharge?    Expected Discharge Plan: Home w Home Health Services Barriers to Discharge: Continued Medical Work up  Expected Discharge Plan and Services Expected Discharge Plan: Home w Home Health Services In-house Referral: Clinical Social Work Discharge Planning Services: CM Consult Post Acute Care Choice: Home Health Living arrangements for the past 2 months: Single Family Home                           HH Arranged: PT, OT, Social Work, Disease Management HH Agency: [facility] Date HH Agency Contacted: 04/04/22 Time HH Agency  Contacted: 1245 Representative spoke with at [facility] Agency: Morrie Sheldon   Social Determinants of Health (SDOH) Interventions    Readmission Risk Interventions     No data to display

## 2022-04-22 NOTE — Progress Notes (Signed)
Physical Therapy Treatment Patient Details Name: [name] MRN: [id number] DOB: 1961/01/20 Today's Date: 04/22/2022   History of Present Illness 61 y.o. female admitted 6/16 for T10-L3 PLIF . 6/24 wound exploration with repair of CSF leak.  S/p lumbar I&D 04/08/22.  PMH: anemia, anxiety, fibromyalgia, GERD, HTN, hyperthyroidism, IBS, ACDF 3/4    PT Comments    Pt was able to ambulate up to ~115 ft with a RW and min guard-minA for safety today, displaying she continues to be at risk for falls as her L knee became unstable as distance, fatigue, and L knee pain increased. Performed 10x squats/sit <> stands from EOB at end of session to strengthen her knees stability. Will continue to follow acutely. Current recommendations remain appropriate as she is at risk for falls with limited home assistance. However, if pt denies SNF then recommend max HH services.     Recommendations for follow up therapy are one component of a multi-disciplinary discharge planning process, led by the attending physician.  Recommendations may be updated based on patient status, additional functional criteria and insurance authorization.  Follow Up Recommendations  Skilled nursing-short term rehab (<3 hours/day) (max HH services if pt denies SNF) Can patient physically be transported by private vehicle: Yes   Assistance Recommended at Discharge Intermittent Supervision/Assistance  Patient can return home with the following A little help with bathing/dressing/bathroom;Help with stairs or ramp for entrance;Assist for transportation;Assistance with cooking/housework;A little help with walking and/or transfers   Equipment Recommendations  None recommended by PT    Recommendations for Other Services       Precautions / Restrictions Precautions Precautions: Back;Fall Precaution Booklet Issued: Yes (comment) Precaution Comments: VAC Required Braces or Orthoses:  (no brace needed) Restrictions Weight Bearing  Restrictions: No     Mobility  Bed Mobility Overal bed mobility: Needs Assistance Bed Mobility: Rolling, Sit to Sidelying Rolling: Supervision       Sit to sidelying: Min guard, HOB elevated General bed mobility comments: Pt cued to lean laterally onto elbow and lift legs back into bed, HOB elevated using bed rail. Supervision to roll and scoot superiorly in bed using bed rail with bed in trendelenburg position    Transfers Overall transfer level: Needs assistance Equipment used: Rolling walker (2 wheels) Transfers: Sit to/from Stand, Bed to chair/wheelchair/BSC Sit to Stand: Min guard   Step pivot transfers: Min guard       General transfer comment: Pt needing cues for hand placement as she tends to try to pull up on RW at times. Min guard for safety coming to stand 12x from EOB and 1x from commode, stand step bed > commode with min guard    Ambulation/Gait Ambulation/Gait assistance: Min guard, Min assist Gait Distance (Feet): 115 Feet Assistive device: Rolling walker (2 wheels) Gait Pattern/deviations: Step-through pattern, Decreased stride length, Trunk flexed Gait velocity: reduced Gait velocity interpretation: <1.31 ft/sec, indicative of household ambulator   General Gait Details: Pt with slow gait needing only min guard assist initially. As distance progressed, pt's L knee began to display some mild instability with pt reporting knee pain as it fatigued, minA for safety to prevent LOB.   Stairs             Wheelchair Mobility    Modified Rankin (Stroke Patients Only)       Balance Overall balance assessment: Needs assistance Sitting-balance support: Feet supported, No upper extremity supported Sitting balance-[name]: Fair Sitting balance - Comments: static sitting EOB   Standing balance  support: Bilateral upper extremity supported, During functional activity, Reliant on assistive device for balance, Single extremity supported Standing  balance-[name]: Poor Standing balance comment: Reliant on UE support                            Cognition Arousal/Alertness: Awake/alert Behavior During Therapy: WFL for tasks assessed/performed Overall Cognitive Status: Impaired/Different from baseline                           Safety/Judgement: Decreased awareness of safety, Decreased awareness of deficits   Problem Solving: Requires verbal cues, Slow processing General Comments: slow to respond to questions at times and needs increased time to process multi-step commands        Exercises Other Exercises Other Exercises: sit <> stand/squat 10x in a row from EOB    General Comments        Pertinent Vitals/Pain Pain Assessment Pain Assessment: Faces Faces Pain Scale: Hurts even more Pain Location: back, L knee Pain Descriptors / Indicators: Discomfort, Grimacing, Guarding Pain Intervention(s): Monitored during session, Limited activity within patient's tolerance, Repositioned    Home Living                          Prior Function            PT Goals (current goals can now be found in the care plan section) Acute Rehab PT Goals Patient Stated Goal: to improve PT Goal Formulation: With patient Time For Goal Achievement: 05/06/22 Potential to Achieve Goals: Good Progress towards PT goals: Progressing toward goals    Frequency    Min 3X/week      PT Plan Current plan remains appropriate    Co-evaluation              AM-PAC PT "6 Clicks" Mobility   Outcome Measure  Help needed turning from your back to your side while in a flat bed without using bedrails?: A Little Help needed moving from lying on your back to sitting on the side of a flat bed without using bedrails?: A Little Help needed moving to and from a bed to a chair (including a wheelchair)?: A Little Help needed standing up from a chair using your arms (e.g., wheelchair or bedside chair)?: A Little Help  needed to walk in hospital room?: A Little Help needed climbing 3-5 steps with a railing? : Total 6 Click Score: 16    End of Session Equipment Utilized During Treatment: Gait belt Activity Tolerance: Patient tolerated treatment well Patient left: in bed;with call bell/phone within reach;with bed alarm set Nurse Communication: Mobility status PT Visit Diagnosis: Difficulty in walking, not elsewhere classified (R26.2);Muscle weakness (generalized) (M62.81);Unsteadiness on feet (R26.81);Other abnormalities of gait and mobility (R26.89)     Time: 1628-1700 PT Time Calculation (min) (ACUTE ONLY): 32 min  Charges:  $Gait Training: 8-22 mins $Therapeutic Exercise: 8-22 mins                     Raymond Gurney, PT, DPT Acute Rehabilitation Services  Office: [phone]    Jewel Baize 04/22/2022, 5:11 PM

## 2022-04-22 NOTE — TOC Progression Note (Addendum)
Transition of Care [facility]) - Progression Note    Patient Details  Name: [name] MRN: [id number] Date of Birth: 1960-11-03  Transition of Care [facility]) CM/SW [place], [place] Phone Number: 04/22/2022, 2:32 PM  Clinical Narrative:     CSW met with patient at bedside. Patient at this time remains reluctant to short term rehab at [facility]. CSW discussed safety concerns if home alone. Patient sattes she has had a negative experience at SNF and does not want to go back. Patient expressed she does not believe SNFs provide rehab & treatment as claimed. CSW explained avenues she can take  at SNF to address her concerns ,such as talking with DON or Ombudsman. Patient states understanding but declined CSW starting SNF search as back up plan. Patient states " I will think about it". CSW informed RN Case Manager will follow up on discussion.   Patient has declined SNF & declined starting the SNF process as back up plan. Patient want to d/c home w/ HH.  TOC will continue to follow and assist with discharge planning.   Thurmond Butts, MSW, LCSW Clinical Social Worker    Expected Discharge Plan: [place] Barriers to Discharge: Continued Medical Work up  Expected Discharge Plan and Services Expected Discharge Plan: [place] In-house Referral: Clinical Social Work Discharge Planning Services: CM Consult Post Acute Care Choice: [place] arrangements for the past 2 months: Single Family Home                           HH Arranged: PT, OT, Social Work, Disease Management [place] Agency: [place] ([place]) Date [place]: 04/04/22 Time [employer]: 1245 Representative spoke with at [place]: [facility] ([place]) Interventions    Readmission Risk Interventions     No data to display

## 2022-04-23 NOTE — Progress Notes (Signed)
OT Cancellation Note  Patient Details Name: LAVRA IMLER MRN: [id number] DOB: 1960/11/04   Cancelled Treatment:    Reason Eval/Treat Not Completed: Patient at procedure or test/ unavailable (IV nurse in pt room upon arrival. OT treatment to continue efforts)  Arcadia Gorgas A Remi Lopata 04/23/2022, 5:36 PM

## 2022-04-23 NOTE — Progress Notes (Signed)
Patient ID: [name], female   DOB: Aug 26, 1961, 61 y.o.   MRN: [id number] BP (!) 102/51 (BP Location: Right Arm)   Pulse 77   Temp 98.5 F (36.9 C) (Oral)   Resp 19   Ht 5\' 3"  (1.6 m)   Wt 93.9 kg   LMP 11/20/2012   SpO2 99%   BMI 36.67 kg/m  Alert and oriented x 4 Has arranged for home care Will change dressing tomorrow Arrange for wound care at home Discharge by weekend

## 2022-04-23 NOTE — Progress Notes (Addendum)
Mobility Specialist Progress Note   04/23/22 1428  Mobility  Activity Ambulated with assistance in hallway  Level of Assistance Contact guard assist, steadying assist  Assistive Device Front wheel walker  Distance Ambulated (ft) 200 ft  Activity Response Tolerated well  $Mobility charge 1 Mobility   Pre Mobility: 74 HR, 105/63 BP During Mobility: 89 HR Post Mobility: 71 HR, 95/61 BP  Received in chair c/o back pain (8/10) and having UE tremors pre mobility but pt agreeable to session today. CG throughout whole session w/ x2 bouts of L knee buckling. Returned back to bed w/o fault and placed call bell in reach w/ bed alarm on. Pt stating to be having no pain post mobility.  Frederico Hamman Mobility Specialist MS [facility] #:  [phone] Acute Rehab Office:  [phone]

## 2022-04-24 NOTE — TOC Progression Note (Addendum)
Transition of Care [facility]) - Progression Note    Patient Details  Name: [name] MRN: [id number] Date of Birth: [birth year]  Transition of Care [facility]) CM/SW Contact  Beckie Busing, RN Phone [phone]  04/24/2022, 2:55 PM  Clinical Narrative:    TOC continues to follow patient for disposition needs. CM spoke with Blossom Hoops at [facility] for home wound vac referral.  Wound vac order has been placed on the patients chart. The MD will need to print name, NPI, sign and date order. Any other blank areas the [employer] rep French Ana will fill in. I am documenting this because per French Ana if the handwriting on everything for prescriber does not match insurance will reject the claim. French Ana has also requested that measurements be recorded with next dressing change. LXWXD must be documented within the last 10 days in order to get approval. French Ana has given CM permission to list her number for any questions or concerns ([phone]).   Patients home health referral previously accepted by [facility]. Morrie Sheldon with [facility] agrees that she can still accept patient for home health needs. MD will need to enter home health orders.   [facility] remains on board for home iv antibiotic infusion. Jeri Modena has been update.   1525 Dr. Franky Macho on unit. CM has obtained signature for wound vac order and made MD aware of need for measurements in order to get approval for home wound vac. Order faxed to [facility] @ [phone].     Expected Discharge Plan: Home w Home Health Services Barriers to Discharge: Continued Medical Work up  Expected Discharge Plan and Services Expected Discharge Plan: Home w Home Health Services In-house Referral: Clinical Social Work Discharge Planning Services: CM Consult Post Acute Care Choice: Home Health Living arrangements for the past 2 months: Single Family Home                           HH Arranged: PT, OT, Social Work, Disease Management HH Agency: [facility]  ([facility]) Date HH Agency Contacted: 04/04/22 Time HH Agency Contacted: 1245 Representative spoke with at [facility] Agency: Morrie Sheldon   Social Determinants of Health (SDOH) Interventions    Readmission Risk Interventions     No data to display

## 2022-04-24 NOTE — Progress Notes (Signed)
Physical Therapy Treatment Patient Details Name: [name] MRN: [id number] DOB: June 11, 1961 Today's Date: 04/24/2022   History of Present Illness 61 y.o. female admitted 6/16 for T10-L3 PLIF . 6/24 wound exploration with repair of CSF leak.  S/p lumbar I&D 04/08/22.  PMH: anemia, anxiety, fibromyalgia, GERD, HTN, hyperthyroidism, IBS, ACDF 3/4    PT Comments    Showing steady improvement.  Emphasis today on general safety, sit to stands, progression of gait stability, but also speed and safe negotiation of stairs.   Recommendations for follow up therapy are one component of a multi-disciplinary discharge planning process, led by the attending physician.  Recommendations may be updated based on patient status, additional functional criteria and insurance authorization.  Follow Up Recommendations  Skilled nursing-short term rehab (<3 hours/day) (extra assist initially if pt decides to go home in lieu of SNF) Can patient physically be transported by private vehicle: Yes   Assistance Recommended at Discharge Intermittent Supervision/Assistance  Patient can return home with the following A little help with bathing/dressing/bathroom;Help with stairs or ramp for entrance;Assist for transportation;Assistance with cooking/housework;A little help with walking and/or transfers   Equipment Recommendations  None recommended by PT    Recommendations for Other Services       Precautions / Restrictions Precautions Precautions: Back;Fall Precaution Comments: VAC Restrictions Weight Bearing Restrictions: No Other Position/Activity Restrictions: no brace needed     Mobility  Bed Mobility               General bed mobility comments: OOB in bathroom with OT on arrival    Transfers Overall transfer level: Needs assistance Equipment used: Rolling walker (2 wheels) Transfers: Sit to/from Stand Sit to Stand: Modified independent (Device/Increase time)           General transfer  comment: used UE's for safety appropriately    Ambulation/Gait Ambulation/Gait assistance: Min guard Gait Distance (Feet): 250 Feet Assistive device: Rolling walker (2 wheels) Gait Pattern/deviations: Step-through pattern   Gait velocity interpretation: 1.31 - 2.62 ft/sec, indicative of limited community ambulator   General Gait Details: generally steady, but slower gait.  Pt able to divide her attension to ambulate, but often prefers to stop to talk.  Pt able to notably, but not significantly increase gait speed from her default speed.   Stairs Stairs: Yes Stairs assistance: Min guard Stair Management: One rail Right, Step to pattern, Forwards (to hands to one rail) Number of Stairs: 5 General stair comments: Ascends leading with L foot and bil hands on R rail, semi-turned body. Descends leading with R foot and R hand on R rail, forward. No LOB, but noted to be mildly effortful up and guarded descenting, min guard for safety.   Wheelchair Mobility    Modified Rankin (Stroke Patients Only)       Balance     Sitting balance-[name]: Good     Standing balance support: Reliant on assistive device for balance Standing balance-[name]: Fair Standing balance comment: Reliant on UE support                            Cognition Arousal/Alertness: Awake/alert Behavior During Therapy: WFL for tasks assessed/performed Overall Cognitive Status: Within Functional Limits for tasks assessed  Exercises      General Comments        Pertinent Vitals/Pain Pain Assessment Pain Assessment: Faces Faces Pain Scale: Hurts a little bit Pain Location: back Pain Descriptors / Indicators: Sore Pain Intervention(s): Monitored during session    Home Living                          Prior Function            PT Goals (current goals can now be found in the care plan section) Acute Rehab PT  Goals PT Goal Formulation: With patient Time For Goal Achievement: 05/06/22 Potential to Achieve Goals: Good Progress towards PT goals: Progressing toward goals    Frequency    Min 3X/week      PT Plan Current plan remains appropriate    Co-evaluation              AM-PAC PT "6 Clicks" Mobility   Outcome Measure  Help needed turning from your back to your side while in a flat bed without using bedrails?: A Little Help needed moving from lying on your back to sitting on the side of a flat bed without using bedrails?: A Little Help needed moving to and from a bed to a chair (including a wheelchair)?: A Little Help needed standing up from a chair using your arms (e.g., wheelchair or bedside chair)?: None Help needed to walk in hospital room?: A Little Help needed climbing 3-5 steps with a railing? : A Little 6 Click Score: 19    End of Session   Activity Tolerance: Patient tolerated treatment well Patient left: in chair;with call bell/phone within reach;with family/visitor present Nurse Communication: Mobility status PT Visit Diagnosis: Difficulty in walking, not elsewhere classified (R26.2);Muscle weakness (generalized) (M62.81);Unsteadiness on feet (R26.81);Other abnormalities of gait and mobility (R26.89)     Time: 1937-9024 PT Time Calculation (min) (ACUTE ONLY): 29 min  Charges:  $Gait Training: 8-22 mins $Therapeutic Activity: 8-22 mins                     04/24/2022  Jacinto Halim., PT Acute Rehabilitation Services [phone]  (pager) [phone]  (office)   Eliseo Gum Quilla Freeze 04/24/2022, 11:53 AM

## 2022-04-24 NOTE — Progress Notes (Signed)
Occupational Therapy Treatment Patient Details Name: [name] MRN: [id number] DOB: Oct 06, 1961 Today's Date: 04/24/2022   History of present illness 61 y.o. female admitted 6/16 for T10-L3 PLIF . 6/24 wound exploration with repair of CSF leak.  S/p lumbar I&D 04/08/22.  PMH: anemia, anxiety, fibromyalgia, GERD, HTN, hyperthyroidism, IBS, ACDF 3/4   OT comments  Patient with significant progress toward patient focused goals compared to prior session.  Patient is demonstrating good safety, fair+ dynamic balance and no significant assist with ADL completion this date.  Patient up and performed stand grooming with distant supervision, and is moving much better in her room with supervision due to wound vac and lines/leads.  Patient is hoping to return home, SNF has been recommended, but if she can arrange some initial assist at home, she could return home with [facility] rehab if she agrees.     Recommendations for follow up therapy are one component of a multi-disciplinary discharge planning process, led by the attending physician.  Recommendations may be updated based on patient status, additional functional criteria and insurance authorization.    Follow Up Recommendations  Skilled nursing-short term rehab (<3 hours/day)    Assistance Recommended at Discharge Intermittent Supervision/Assistance  Patient can return home with the following  Assist for transportation;Assistance with cooking/housework   Equipment Recommendations  None recommended by OT    Recommendations for Other Services      Precautions / Restrictions Precautions Precautions: Back;Fall Precaution Comments: VAC Restrictions Weight Bearing Restrictions: No Other Position/Activity Restrictions: no brace needed       Mobility Bed Mobility               General bed mobility comments: up in recliner    Transfers Overall transfer level: Needs assistance Equipment used: Rolling walker (2 wheels) Transfers: Sit  to/from Stand Sit to Stand: Modified independent (Device/Increase time)                 Balance Overall balance assessment: Needs assistance Sitting-balance support: Feet supported Sitting balance-[name]: Good     Standing balance support: Reliant on assistive device for balance Standing balance-[name]: Fair                             ADL either performed or assessed with clinical judgement   ADL   Eating/Feeding: Independent;Sitting   Grooming: Supervision/safety;Wash/dry hands;Wash/dry face;Oral care Grooming Details (indicate cue type and reason): patient aboe to static stand without the use of RW         Upper Body Dressing : Set up;Sitting   Lower Body Dressing: Min guard;Minimal assistance;Sit to/from stand   Toilet Transfer: Supervision/safety;Ambulation;Regular Toilet;Rolling walker (2 wheels)   Toileting- Clothing Manipulation and Hygiene: Modified independent;Sitting/lateral lean              Extremity/Trunk Assessment Upper Extremity Assessment Upper Extremity Assessment: Overall WFL for tasks assessed   Lower Extremity Assessment Lower Extremity Assessment: Defer to PT evaluation                          Cognition Arousal/Alertness: Awake/alert Behavior During Therapy: WFL for tasks assessed/performed Overall Cognitive Status: Within Functional Limits for tasks assessed  Pertinent Vitals/ Pain       Pain Assessment Pain Assessment: Faces Faces Pain Scale: Hurts a little bit Pain Location: back Pain Descriptors / Indicators: Sore Pain Intervention(s): Monitored during session                                                          Frequency  Min 2X/week        Progress Toward Goals  OT Goals(current goals can now be found in the care plan section)  Progress towards OT goals: Progressing  toward goals  Acute Rehab OT Goals Patient Stated Goal: return home OT Goal Formulation: With patient Time For Goal Achievement: 05/01/22 Potential to Achieve Goals: Good ADL Goals Pt Will Perform Lower Body Bathing: with supervision;sit to/from stand Pt Will Perform Lower Body Dressing: with supervision;sit to/from stand Pt Will Transfer to Toilet: with modified independence;ambulating;regular height toilet Additional ADL Goal #1: Independent with log roll with no VC's prior to ADL  Plan Discharge plan needs to be updated    Co-evaluation                 AM-PAC OT "6 Clicks" Daily Activity     Outcome Measure   Help from another person eating meals?: None Help from another person taking care of personal grooming?: None Help from another person toileting, which includes using toliet, bedpan, or urinal?: A Little Help from another person bathing (including washing, rinsing, drying)?: A Little Help from another person to put on and taking off regular upper body clothing?: None Help from another person to put on and taking off regular lower body clothing?: A Little 6 Click Score: 21    End of Session Equipment Utilized During Treatment: Rolling walker (2 wheels)  OT Visit Diagnosis: Unsteadiness on feet (R26.81);Muscle weakness (generalized) (M62.81);Pain   Activity Tolerance Patient tolerated treatment well   Patient Left in chair;with call bell/phone within reach   Nurse Communication Mobility status        Time: 4665-9935 OT Time Calculation (min): 25 min  Charges: OT General Charges $OT Visit: 1 Visit OT Treatments $Self Care/Home Management : 23-37 mins  04/24/2022  RP, OTR/L  Acute Rehabilitation Services  Office:  [phone]   [name] 04/24/2022, 11:27 AM

## 2022-04-24 NOTE — Progress Notes (Signed)
Mobility Specialist Progress Note   04/24/22 1800  Mobility  Activity Stood at bedside (x15 STS)   Received pt in bed having no complaints and agreeable to mobility. Performed x15 STS w/o any physical assistance and no bouts of buckling this session. Left w/ pt sitting EOB having no complaints w/ call bell by side and bed alarm on.  Frederico Hamman Mobility Specialist MS [facility] #:  [phone] Acute Rehab Office:  [phone]

## 2022-04-25 NOTE — Progress Notes (Signed)
Patient ID: [name], female   DOB: 08-Apr-1961, 61 y.o.   MRN: [id number] BP 121/68 (BP Location: Right Arm)   Pulse 70   Temp 98.3 F (36.8 C) (Oral)   Resp 14   Ht 5\' 3"  (1.6 m)   Wt 93.9 kg   LMP 11/20/2012   SpO2 100%   BMI 36.67 kg/m   Alert and oriented Moving all extremities Dressing in place Anticipating discharge soon

## 2022-04-25 NOTE — Progress Notes (Signed)
Mobility Specialist Progress Note   04/25/22 1009  Mobility  Activity Refused mobility   Pt requesting for MS to come back in the afternoon. Will f/u later if time permits.   Frederico Hamman Mobility Specialist MS [facility] #:  [phone] Acute Rehab Office:  [phone]

## 2022-04-25 NOTE — Progress Notes (Signed)
Mobility Specialist Progress Note   04/25/22 1700  Mobility  Activity Refused mobility   Pt difficult to arouse w/ multiple attempts. Will f/u tomorrow if time permits.   Frederico Hamman Mobility Specialist MS [facility] #:  [phone] Acute Rehab Office:  [phone]

## 2022-04-26 NOTE — Progress Notes (Signed)
Patient ID: [name], female   DOB: 10/11/1960, 61 y.o.   MRN: [id number] BP 115/67 (BP Location: Right Arm)   Pulse 74   Temp 98.6 F (37 C) (Oral)   Resp 18   Ht 5\' 3"  (1.6 m)   Wt 93.9 kg   LMP 11/20/2012   SpO2 98%   BMI 36.67 kg/m  Alert and oriented x 4 Moving all extremities Wound vac in place Dimensions given.

## 2022-04-26 NOTE — Progress Notes (Deleted)
PM Progress Note.  Pt has shown good improvement toward goals.  Pt wishes to go directly home and probably will fare well if she can gather an appropriate amount of committed assist initially and then work to intermittent assist.  Emphasis on education, transition with rolling and coming up from side-lying, sit to stand safety/hand placement and progression of gait stability, stamina and gait speed with the RW.      04/26/22 1318  PT Visit Information  Last PT Received On 04/26/22  Assistance Needed +1  History of Present Illness 61 y.o. female admitted 6/16 for T10-L3 PLIF . 6/24 wound exploration with repair of CSF leak.  S/p lumbar I&D 04/08/22.  PMH: anemia, anxiety, fibromyalgia, GERD, HTN, hyperthyroidism, IBS, ACDF 3/4  Subjective Data  Patient Stated Goal to improve  Precautions  Precautions Back;Fall  Precaution Booklet Issued Yes (comment)  Precaution Comments VAC  Restrictions  Other Position/Activity Restrictions no brace needed  Pain Assessment  Pain Assessment Faces  Faces Pain Scale 4  Pain Location back, general aches  Pain Descriptors / Indicators Aching;Sore  Pain Intervention(s) Monitored during session  Cognition  Arousal/Alertness Awake/alert  Behavior During Therapy WFL for tasks assessed/performed  Overall Cognitive Status Within Functional Limits for tasks assessed  Bed Mobility  Overal bed mobility Needs Assistance  Bed Mobility Rolling;Sidelying to Sit  Rolling Supervision  Sidelying to sit Supervision  General bed mobility comments reinforces best technique, had pt try without rail and Mostly flat HOB.  Up via R elbow without assistf  Transfers  Overall transfer level Needs assistance  Equipment used Rolling walker (2 wheels)  Transfers Sit to/from Stand  Sit to Stand Supervision  General transfer comment reinforced safety/hand placement.  pt come forward and boosted without assist.  Ambulation/Gait  Ambulation/Gait assistance Supervision  Gait  Distance (Feet) 300 Feet  Assistive device Rolling walker (2 wheels)  Gait Pattern/deviations Step-through pattern  General Gait Details generally steady, frequent stops for "fibromyalgia pain" and to discuss something.  Gait velocity interpretation <1.8 ft/sec, indicate of risk for recurrent falls  Balance  Sitting balance-[name] Good  Standing balance-[name] Fair  General Comments  General comments (skin integrity, edema, etc.) vss on RA throughout  General Exercises - Lower Extremity  Hip Flexion/Marching  (Walking legs up/down to warm up prior to mobility/while flattening the bed.)  PT - End of Session  Activity Tolerance Patient tolerated treatment well  Patient left in chair;with call bell/phone within reach;with family/visitor present  Nurse Communication Mobility status   PT - Assessment/Plan  PT Plan Discharge plan needs to be updated  PT Visit Diagnosis Other abnormalities of gait and mobility (R26.89);Difficulty in walking, not elsewhere classified (R26.2);Pain  Pain - part of body  (back and general)  PT Frequency (ACUTE ONLY) Min 3X/week  Follow Up Recommendations Home health PT (IF pt has set up, committed assist to be safe)  Assistance recommended at discharge Intermittent Supervision/Assistance  Patient can return home with the following A little help with bathing/dressing/bathroom;Help with stairs or ramp for entrance;Assist for transportation;Assistance with cooking/housework;A little help with walking and/or transfers  PT equipment None recommended by PT  AM-PAC PT "6 Clicks" Mobility Outcome Measure (Version 2)  Help needed turning from your back to your side while in a flat bed without using bedrails? 3  Help needed moving from lying on your back to sitting on the side of a flat bed without using bedrails? 3  Help needed moving to and from a bed to a  chair (including a wheelchair)? 3  Help needed standing up from a chair using your arms (e.g., wheelchair  or bedside chair)? 3  Help needed to walk in hospital room? 3  Help needed climbing 3-5 steps with a railing?  3  6 Click Score 18  Consider Recommendation of Discharge To: Home with [facility]  Progressive Mobility  What is the highest level of mobility based on the progressive mobility assessment? Level 5 (Walks with assist in room/hall) - Balance while stepping forward/back and can walk in room with assist - Complete  Activity Ambulated with assistance in hallway  PT Goal Progression  Progress towards PT goals Progressing toward goals  Acute Rehab PT Goals  PT Goal Formulation With patient  Time For Goal Achievement 05/06/22  Potential to Achieve Goals Good    04/26/2022  Jacinto Halim., PT Acute Rehabilitation Services [phone]  (pager) [phone]  (office)

## 2022-04-26 NOTE — Progress Notes (Signed)
PM Progress note.  Pt has shown good improvement toward goals.  Pt wishes to go directly home and probably will fare well if she can gather an appropriate amount of committed assist initially and then work to intermittent assist.  Emphasis on education, transition with rolling and coming up from side-lying, sit to stand safety/hand placement and progression of gait stability, stamina and gait speed with the RW.     04/26/22 1318  PT Visit Information  Last PT Received On 04/26/22  Assistance Needed +1  History of Present Illness 61 y.o. female admitted 6/16 for T10-L3 PLIF . 6/24 wound exploration with repair of CSF leak.  S/p lumbar I&D 04/08/22.  PMH: anemia, anxiety, fibromyalgia, GERD, HTN, hyperthyroidism, IBS, ACDF 3/4  Subjective Data  Patient Stated Goal to improve  Precautions  Precautions Back;Fall  Precaution Booklet Issued Yes (comment)  Precaution Comments VAC  Restrictions  Other Position/Activity Restrictions no brace needed  Pain Assessment  Pain Assessment Faces  Faces Pain Scale 4  Pain Location back, general aches  Pain Descriptors / Indicators Aching;Sore  Pain Intervention(s) Monitored during session  Cognition  Arousal/Alertness Awake/alert  Behavior During Therapy WFL for tasks assessed/performed  Overall Cognitive Status Within Functional Limits for tasks assessed  Bed Mobility  Overal bed mobility Needs Assistance  Bed Mobility Rolling;Sidelying to Sit  Rolling Supervision  Sidelying to sit Supervision  General bed mobility comments reinforces best technique, had pt try without rail and Mostly flat HOB.  Up via R elbow without assistf  Transfers  Overall transfer level Needs assistance  Equipment used Rolling walker (2 wheels)  Transfers Sit to/from Stand  Sit to Stand Supervision  General transfer comment reinforced safety/hand placement.  pt come forward and boosted without assist.  Ambulation/Gait  Ambulation/Gait assistance Supervision  Gait  Distance (Feet) 300 Feet  Assistive device Rolling walker (2 wheels)  Gait Pattern/deviations Step-through pattern  General Gait Details generally steady, frequent stops for "fibromyalgia pain" and to discuss something.  Gait velocity interpretation <1.8 ft/sec, indicate of risk for recurrent falls  Balance  Sitting balance-[name] Good  Standing balance-[name] Fair  General Comments  General comments (skin integrity, edema, etc.) vss on RA throughout  General Exercises - Lower Extremity  Hip Flexion/Marching  (Walking legs up/down to warm up prior to mobility/while flattening the bed.)  PT - End of Session  Activity Tolerance Patient tolerated treatment well  Patient left in chair;with call bell/phone within reach;with family/visitor present  Nurse Communication Mobility status   PT - Assessment/Plan  PT Plan Discharge plan needs to be updated  PT Visit Diagnosis Other abnormalities of gait and mobility (R26.89);Difficulty in walking, not elsewhere classified (R26.2);Pain  Pain - part of body  (back and general)  PT Frequency (ACUTE ONLY) Min 3X/week  Follow Up Recommendations Home health PT (IF pt has set up, committed assist to be safe)  Assistance recommended at discharge Intermittent Supervision/Assistance  Patient can return home with the following A little help with bathing/dressing/bathroom;Help with stairs or ramp for entrance;Assist for transportation;Assistance with cooking/housework;A little help with walking and/or transfers  PT equipment None recommended by PT  AM-PAC PT "6 Clicks" Mobility Outcome Measure (Version 2)  Help needed turning from your back to your side while in a flat bed without using bedrails? 3  Help needed moving from lying on your back to sitting on the side of a flat bed without using bedrails? 3  Help needed moving to and from a bed to a chair (  including a wheelchair)? 3  Help needed standing up from a chair using your arms (e.g., wheelchair  or bedside chair)? 3  Help needed to walk in hospital room? 3  Help needed climbing 3-5 steps with a railing?  3  6 Click Score 18  Consider Recommendation of Discharge To: Home with [facility]  Progressive Mobility  What is the highest level of mobility based on the progressive mobility assessment? Level 5 (Walks with assist in room/hall) - Balance while stepping forward/back and can walk in room with assist - Complete  Activity Ambulated with assistance in hallway  PT Goal Progression  Progress towards PT goals Progressing toward goals  Acute Rehab PT Goals  PT Goal Formulation With patient  Time For Goal Achievement 05/06/22  Potential to Achieve Goals Good  PT Time Calculation  PT Start Time (ACUTE ONLY) 1133  PT Stop Time (ACUTE ONLY) 1220  PT Time Calculation (min) (ACUTE ONLY) 47 min  PT General Charges  $$ ACUTE PT VISIT 1 Visit  PT Treatments  $Gait Training 23-37 mins  $Therapeutic Activity 8-22 mins    04/26/2022  Jacinto Halim., PT Acute Rehabilitation Services [phone]  (pager) [phone]  (office)

## 2022-04-26 NOTE — Progress Notes (Signed)
AM PROGRESS NOTE  Pt asked therapist to return a little later after the pain meds had "kicked in."     04/26/22 1300  PT Visit Information  Last PT Received On 04/26/22  Assistance Needed +1  History of Present Illness 61 y.o. female admitted 6/16 for T10-L3 PLIF . 6/24 wound exploration with repair of CSF leak.  S/p lumbar I&D 04/08/22.  PMH: anemia, anxiety, fibromyalgia, GERD, HTN, hyperthyroidism, IBS, ACDF 3/4  Subjective Data  Patient Stated Goal to improve  Precautions  Precautions Back;Fall  Precaution Booklet Issued Yes (comment)  Precaution Comments VAC  Restrictions  Other Position/Activity Restrictions no brace needed  Pain Assessment  Pain Assessment Faces  Faces Pain Scale 4  Pain Descriptors / Indicators Sore  Cognition  Arousal/Alertness Awake/alert  Behavior During Therapy WFL for tasks assessed/performed  Overall Cognitive Status Within Functional Limits for tasks assessed  General Comments  General comments (skin integrity, edema, etc.) Initiated reinforcement on back precautions, log rolling/up from sidelying, progression of activity and discussion on lifting restriction with examples .  General Exercises - Lower Extremity  Hip Flexion/Marching AROM;Strengthening;Both;10 reps;Supine (Walking legs up/down to warm up prior to mobility/while flattening the bed.)  PT - End of Session  Activity Tolerance Patient limited by pain  Patient left in bed;with call bell/phone within reach  Nurse Communication Mobility status   PT - Assessment/Plan  PT Plan Current plan remains appropriate  PT Visit Diagnosis Other abnormalities of gait and mobility (R26.89);Pain  Pain - part of body  (back spasms)  PT Frequency (ACUTE ONLY) Min 3X/week  Follow Up Recommendations Skilled nursing-short term rehab (<3 hours/day)  Assistance recommended at discharge Intermittent Supervision/Assistance  Patient can return home with the following A little help with  bathing/dressing/bathroom;Help with stairs or ramp for entrance;Assist for transportation;Assistance with cooking/housework;A little help with walking and/or transfers  PT equipment None recommended by PT  AM-PAC PT "6 Clicks" Mobility Outcome Measure (Version 2)  Help needed turning from your back to your side while in a flat bed without using bedrails? 3  Help needed moving from lying on your back to sitting on the side of a flat bed without using bedrails? 3  Help needed moving to and from a bed to a chair (including a wheelchair)? 3  Help needed standing up from a chair using your arms (e.g., wheelchair or bedside chair)? 4  Help needed to walk in hospital room? 3  Help needed climbing 3-5 steps with a railing?  3  6 Click Score 19  Consider Recommendation of Discharge To: Home with [facility]  Progressive Mobility  What is the highest level of mobility based on the progressive mobility assessment? Level 5 (Walks with assist in room/hall) - Balance while stepping forward/back and can walk in room with assist - Complete  Activity  (pt asked for therapist to return after pain meds "kicked in")  PT Goal Progression  Progress towards PT goals Progressing toward goals  Acute Rehab PT Goals  PT Goal Formulation With patient  Time For Goal Achievement 05/06/22  Potential to Achieve Goals Good  PT Time Calculation  PT Start Time (ACUTE ONLY) 1035  PT Stop Time (ACUTE ONLY) 1045  PT Time Calculation (min) (ACUTE ONLY) 10 min     04/26/2022  Jacinto Halim., PT Acute Rehabilitation Services [phone]  (pager) [phone]  (office)

## 2022-04-26 NOTE — Progress Notes (Signed)
AM PROGRESS NOTE  After initiation of back education/reinforcement and some warm up, pt stated she was in too much pain and asked if therapist would come back when pain meds had "kicked in"    04/26/22 1300  PT Visit Information  Last PT Received On 04/26/22  Assistance Needed +1  History of Present Illness 61 y.o. female admitted 6/16 for T10-L3 PLIF . 6/24 wound exploration with repair of CSF leak.  S/p lumbar I&D 04/08/22.  PMH: anemia, anxiety, fibromyalgia, GERD, HTN, hyperthyroidism, IBS, ACDF 3/4  Subjective Data  Patient Stated Goal to improve  Precautions  Precautions Back;Fall  Precaution Booklet Issued Yes (comment)  Precaution Comments VAC  Restrictions  Other Position/Activity Restrictions no brace needed  Pain Assessment  Pain Assessment Faces  Faces Pain Scale 4  Pain Descriptors / Indicators Sore  Cognition  Arousal/Alertness Awake/alert  Behavior During Therapy WFL for tasks assessed/performed  Overall Cognitive Status Within Functional Limits for tasks assessed  General Comments  General comments (skin integrity, edema, etc.) Initiated reinforcement on back precautions, log rolling/up from sidelying, progression of activity and discussion on lifting restriction with examples .  General Exercises - Lower Extremity  Hip Flexion/Marching AROM;Strengthening;Both;10 reps;Supine (Walking legs up/down to warm up prior to mobility/while flattening the bed.)  PT - End of Session  Activity Tolerance Patient limited by pain  Patient left in bed;with call bell/phone within reach  Nurse Communication Mobility status   PT - Assessment/Plan  PT Plan Current plan remains appropriate  PT Visit Diagnosis Other abnormalities of gait and mobility (R26.89);Pain  Pain - part of body  (back spasms)  PT Frequency (ACUTE ONLY) Min 3X/week  Follow Up Recommendations Skilled nursing-short term rehab (<3 hours/day)  Assistance recommended at discharge Intermittent Supervision/Assistance   Patient can return home with the following A little help with bathing/dressing/bathroom;Help with stairs or ramp for entrance;Assist for transportation;Assistance with cooking/housework;A little help with walking and/or transfers  PT equipment None recommended by PT  AM-PAC PT "6 Clicks" Mobility Outcome Measure (Version 2)  Help needed turning from your back to your side while in a flat bed without using bedrails? 3  Help needed moving from lying on your back to sitting on the side of a flat bed without using bedrails? 3  Help needed moving to and from a bed to a chair (including a wheelchair)? 3  Help needed standing up from a chair using your arms (e.g., wheelchair or bedside chair)? 4  Help needed to walk in hospital room? 3  Help needed climbing 3-5 steps with a railing?  3  6 Click Score 19  Consider Recommendation of Discharge To: Home with [facility]  Progressive Mobility  What is the highest level of mobility based on the progressive mobility assessment? Level 5 (Walks with assist in room/hall) - Balance while stepping forward/back and can walk in room with assist - Complete  Activity  (pt asked for therapist to return after pain meds "kicked in")  PT Goal Progression  Progress towards PT goals Progressing toward goals  Acute Rehab PT Goals  PT Goal Formulation With patient  Time For Goal Achievement 05/06/22  Potential to Achieve Goals Good  PT Time Calculation  PT Start Time (ACUTE ONLY) 1035  PT Stop Time (ACUTE ONLY) 1045  PT Time Calculation (min) (ACUTE ONLY) 10 min  PT General Charges  $$ ACUTE PT VISIT 1 Visit  PT Treatments  $Self Care/Home Management 8-22     04/26/2022  Jacinto Halim., PT Acute Rehabilitation  Services [phone]  (pager) [phone]  (office)

## 2022-04-26 NOTE — Progress Notes (Signed)
  Mobility Specialist Criteria Algorithm Info.    04/26/22 1646  Mobility  Activity Refused mobility   Will f/u as time permits.  04/26/2022 4:46 PM  [place] Kamani Magnussen, CMS, BS EXP Acute Rehabilitation Services  Phone:[phone] Office: [phone]

## 2022-04-26 NOTE — Progress Notes (Signed)
Patient ID: [name], female   DOB: Jul 16, 1961, 61 y.o.   MRN: [id number] Wound is 10 inches in length, 4 inches in width,5 inches in depth

## 2022-04-26 NOTE — TOC Progression Note (Addendum)
Transition of Care [facility]) - Progression Note    Patient Details  Name: [name] MRN: [id number] Date of Birth: 06/10/1961  [facility]) CM/SW Contact  Beckie Busing, RN Phone [phone]   04/26/2022, 2:45 PM  Clinical Narrative:    TOC remains on standby to assist with disposition planning. Home wound vac has not been released. [employer] continues to follow. Wound measurements LXWXD must be documented within the last 10 days in order to get approval.    Expected Discharge Plan: Home w Home Health Services Barriers to Discharge: Continued Medical Work up  Expected Discharge Plan and Services Expected Discharge Plan: Home w Home Health Services In-house Referral: Clinical Social Work Discharge Planning Services: CM Consult Post Acute Care Choice: Home Health Living arrangements for the past 2 months: Single Family Home                           HH Arranged: PT, OT, Social Work, Disease Management HH Agency: [facility] Date HH Agency Contacted: 04/04/22 Time HH Agency Contacted: 1245 Representative spoke with at [facility] Agency: Morrie Sheldon   Social Determinants of Health (SDOH) Interventions    Readmission Risk Interventions     No data to display

## 2022-04-27 NOTE — Progress Notes (Signed)
Neurosurgery Service Progress Note  Subjective: No acute events overnight, no new complaints   Objective: Vitals:   04/26/22 2029 04/26/22 2305 04/27/22 0329 04/27/22 0740  BP: 125/67 (!) 104/54 (!) 109/91 (!) 147/69  Pulse: 78 79  71  Resp: 19  16 13   Temp: 98.6 F (37 C) 98.3 F (36.8 C) 97.7 F (36.5 C) 97.6 F (36.4 C)  TempSrc: Oral Oral Oral Oral  SpO2: 98% 100%  95%  Weight:      Height:        Physical Exam: Strength 5/5 x4 and SILTx4, wound vac in place   Assessment & Plan: 61 y.o. woman s/p T10-L3 PLF  -no change in neurosurgical plan of care, home w/ home health pending to help with wound vac  46  04/27/22 11:05 AM

## 2022-04-27 NOTE — Progress Notes (Signed)
Occupational Therapy Treatment Patient Details Name: [name] MRN: [id number] DOB: 12-Nov-1960 Today's Date: 04/27/2022   History of present illness 61 y.o. female admitted 6/16 for T10-L3 PLIF . 6/24 wound exploration with repair of CSF leak.  S/p lumbar I&D 04/08/22.  PMH: anemia, anxiety, fibromyalgia, GERD, HTN, hyperthyroidism, IBS, ACDF 3/4   OT comments  Pt is making excellent progress. She demonstrated supervision level functional mobility with RW and ADLs, not physical assist required throughout. Educated on importance of reacher use for all LB ADLs, pt verbalized understanding. Pt continues to benefit. D/c recommendation updated to home with HHOT.    Recommendations for follow up therapy are one component of a multi-disciplinary discharge planning process, led by the attending physician.  Recommendations may be updated based on patient status, additional functional criteria and insurance authorization.    Follow Up Recommendations  Home health OT    Assistance Recommended at Discharge Intermittent Supervision/Assistance  Patient can return home with the following  Assist for transportation;Assistance with cooking/housework   Equipment Recommendations  None recommended by OT       Precautions / Restrictions Precautions Precautions: Back;Fall Precaution Booklet Issued: Yes (comment) Precaution Comments: VAC Restrictions Weight Bearing Restrictions: No Other Position/Activity Restrictions: no brace needed       Mobility Bed Mobility Overal bed mobility: Needs Assistance Bed Mobility: Rolling, Sidelying to Sit Rolling: Supervision Sidelying to sit: Supervision     Sit to sidelying: Supervision General bed mobility comments: enforced log roll    Transfers Overall transfer level: Needs assistance Equipment used: Rolling walker (2 wheels) Transfers: Sit to/from Stand Sit to Stand: Supervision                 Balance Overall balance assessment: Needs  assistance Sitting-balance support: Feet supported Sitting balance-[name]: Good     Standing balance support: Reliant on assistive device for balance Standing balance-[name]: Fair                             ADL either performed or assessed with clinical judgement   ADL Overall ADL's : Needs assistance/impaired     Grooming: Wash/dry Clinical cytogeneticist: Supervision/safety;Ambulation;BSC/3in1;Rolling walker (2 wheels) Toilet Transfer Details (indicate cue type and reason): BSC over toilet Toileting- Clothing Manipulation and Hygiene: Supervision/safety;Sitting/lateral lean       Functional mobility during ADLs: Supervision/safety;Rolling walker (2 wheels) General ADL Comments: supervision throughout, no physical assist needed. Reviewed AE use for LB dressing and safety, pt verbalized understanding    Extremity/Trunk Assessment Upper Extremity Assessment Upper Extremity Assessment: Overall WFL for tasks assessed RUE Deficits / Details: hx of neuropathy, ROM and MMT WFL but noted spastic jerking of UE throughout session: 6/12 continues RUE Sensation: decreased light touch RUE Coordination: decreased fine motor;decreased gross motor LUE Deficits / Details: hx of neuropathy, ROM and MMT WFL but noted spastic jerking of UE throughout session: 6/12 continues with occasional jerking movements, more so on R UE this date. LUE Sensation: decreased light touch LUE Coordination: decreased fine motor;decreased gross motor   Lower Extremity Assessment Lower Extremity Assessment: Defer to PT evaluation        Vision   Vision Assessment?: No apparent visual deficits   Perception Perception Perception: Within Functional Limits   Praxis Praxis Praxis: Intact    Cognition Arousal/Alertness: Awake/alert Behavior During Therapy: WFL for tasks  assessed/performed Overall Cognitive Status: Within Functional  Limits for tasks assessed                                 General Comments: slow to respond but [facility]        Exercises      Shoulder Instructions       General Comments VSS on RA    Pertinent Vitals/ Pain       Pain Assessment Pain Assessment: Faces Faces Pain Scale: Hurts a little bit Pain Location: back/ generalized Pain Descriptors / Indicators: Aching, Sore Pain Intervention(s): Monitored during session, Limited activity within patient's tolerance  Home Living                                          Prior Functioning/Environment              Frequency  Min 2X/week        Progress Toward Goals  OT Goals(current goals can now be found in the care plan section)  Progress towards OT goals: Progressing toward goals  Acute Rehab OT Goals Patient Stated Goal: home tuesday OT Goal Formulation: With patient Time For Goal Achievement: 05/01/22 Potential to Achieve Goals: Good ADL Goals Pt Will Perform Eating: with modified independence;with adaptive utensils;sitting Pt Will Perform Lower Body Bathing: with supervision;sit to/from stand Pt Will Perform Lower Body Dressing: with supervision;sit to/from stand Pt Will Transfer to Toilet: with modified independence;ambulating;regular height toilet Additional ADL Goal #1: Independent with log roll with no VC's prior to ADL  Plan Discharge plan needs to be updated    Co-evaluation                 AM-PAC OT "6 Clicks" Daily Activity     Outcome Measure   Help from another person eating meals?: None Help from another person taking care of personal grooming?: A Little Help from another person toileting, which includes using toliet, bedpan, or urinal?: A Little Help from another person bathing (including washing, rinsing, drying)?: A Little Help from another person to put on and taking off regular upper body clothing?: None Help from another person to put on and taking off  regular lower body clothing?: A Little 6 Click Score: 20    End of Session Equipment Utilized During Treatment: Rolling walker (2 wheels)  OT Visit Diagnosis: Unsteadiness on feet (R26.81);Muscle weakness (generalized) (M62.81);Pain   Activity Tolerance Patient tolerated treatment well   Patient Left in bed;with call bell/phone within reach   Nurse Communication Mobility status        Time: 0177-9390 OT Time Calculation (min): 22 min  Charges: OT General Charges $OT Visit: 1 Visit OT Treatments $Self Care/Home Management : 8-22 mins    Preston Garabedian A Valery Amedee 04/27/2022, 4:19 PM

## 2022-04-28 NOTE — Progress Notes (Signed)
Neurosurgery Service Progress Note  Subjective: No acute events overnight, no new complaints   Objective: Vitals:   04/27/22 1918 04/27/22 2314 04/28/22 0319 04/28/22 0733  BP: 133/66 (!) 141/76 122/69 128/68  Pulse: 74 69 80   Resp: 13 13 16 16   Temp: 98.6 F (37 C) (!) 97.4 F (36.3 C) 98 F (36.7 C) 97.9 F (36.6 C)  TempSrc: Oral Oral Oral Oral  SpO2: 99% 95% 99%   Weight:      Height:        Physical Exam: Strength 5/5 x4 and SILTx4, wound vac in place   Assessment & Plan: 61 y.o. woman s/p T10-L3 PLF  -no change in neurosurgical plan of care, home health being arranged to help with wound vac but does not appear to be ready yet   46  04/28/22 10:38 AM

## 2022-04-29 NOTE — Progress Notes (Signed)
Patient ID: [name], female   DOB: 07-Dec-1960, 61 y.o.   MRN: [id number] BP 120/77 (BP Location: Right Arm)   Pulse 72   Temp 98.2 F (36.8 C) (Oral)   Resp 17   Ht 5\' 3"  (1.6 m)   Wt 93.9 kg   LMP 11/20/2012   SpO2 95%   BMI 36.67 kg/m  Discharge tomorrow Moving all extremities well Wound dressing in place.

## 2022-04-29 NOTE — Progress Notes (Signed)
Mobility Specialist Progress Note   04/29/22 1515  Mobility  Activity Ambulated with assistance in hallway  Level of Assistance Contact guard assist, steadying assist  Assistive Device Front wheel walker  Distance Ambulated (ft) 420 ft  Activity Response Tolerated well  $Mobility charge 1 Mobility   Patient received in bed agreeable to participate in mobility. Requesting to use BR before mobility, successful void.  Ambulated in hallway independently with steady gait. Returned to room without complaint or incident. Was left supine with all needs met and call bell in reach.   Jeremiah Hedrick Mobility Specialist MS [place] #:  [phone] Acute Rehab Office:  [phone]  

## 2022-04-29 NOTE — TOC Progression Note (Addendum)
Transition of Care [facility]) - Progression Note    Patient Details  Name: [name] MRN: [id number] Date of Birth: Aug 07, 1961  [facility]) CM/SW Contact  Beckie Busing, RN Phone [phone]  04/29/2022, 8:31 AM  Clinical Narrative:    Wound measurements have been faxed to Blossom Hoops with [employer] for home wound vac insurance approval.   Home health has been arranged and verified on 7/19 that [facility]  can still accepted patient for home health needs.   4742 CM just received notification from [facility] that patient has been approved for home wound vac. [facility] on standby to release wound vac as soon as patient is medically cleared for d/c. TOC remains ready with [facility] services with [facility] in place, home infusion for IV abx with [facility] in place.   Expected Discharge Plan: Home w Home Health Services Barriers to Discharge: Continued Medical Work up  Expected Discharge Plan and Services Expected Discharge Plan: Home w Home Health Services In-house Referral: Clinical Social Work Discharge Planning Services: CM Consult Post Acute Care Choice: Home Health Living arrangements for the past 2 months: Single Family Home                           HH Arranged: PT, OT, Social Work, Disease Management HH Agency: [facility] Date HH Agency Contacted: 04/04/22 Time HH Agency Contacted: 1245 Representative spoke with at [facility] Agency: Morrie Sheldon   Social Determinants of Health (SDOH) Interventions    Readmission Risk Interventions     No data to display

## 2022-04-29 NOTE — Progress Notes (Signed)
OT Cancellation Note  Patient Details Name: [name] MRN: [id number] DOB: 1961-05-28   Cancelled Treatment:    Reason Eval/Treat Not Completed: Patient at procedure or test/ unavailable;Other (comment) pt working with mobility team at time of OTA arrival, will check back as time allows for OT intervention.   Lenor Derrick., COTA/L Acute Rehabilitation Services [phone]   Barron Schmid 04/29/2022, 3:48 PM

## 2022-04-30 ENCOUNTER — Other Ambulatory Visit (HOSPITAL_COMMUNITY): Payer: Self-pay

## 2022-04-30 ENCOUNTER — Inpatient Hospital Stay: Payer: Self-pay

## 2022-04-30 MED ORDER — OXYCODONE HCL 15 MG PO TABS
15.0000 mg | ORAL_TABLET | Freq: Four times a day (QID) | ORAL | 0 refills | Status: AC | PRN
  Filled 2022-04-30: qty 30, 8d supply, fill #0

## 2022-04-30 MED ORDER — METHOCARBAMOL 500 MG PO TABS: 500.0000 mg | ORAL_TABLET | Freq: Four times a day (QID) | ORAL | 1 refills | Status: DC | PRN

## 2022-04-30 MED ORDER — CEFEPIME IV (FOR PTA / DISCHARGE USE ONLY): 2.0000 g | Freq: Three times a day (TID) | INTRAVENOUS | 0 refills | Status: AC

## 2022-04-30 NOTE — TOC Progression Note (Addendum)
Transition of Care [facility]) - Progression Note    Patient Details  Name: [name] MRN: [id number] Date of Birth: [birth year]  Transition of Care [facility]) CM/SW Contact  Beckie Busing, RN Phone Number:[phone]  04/30/2022, 12:03 PM  Clinical Narrative:    CM at bedside to deliver home wound vac. Proof of delivery has been signed and faxed to [facility]. Patient currently does not have PICC line per patient she has been waiting for PICC placement. CM attempted to reach Dr. Franky Macho @ [phone]. Message has been left for medical assistance on voicemail. Will await return call. Patient needs PICC for discharge home with IV antibiotics.    Expected Discharge Plan: Home w Home Health Services Barriers to Discharge: Continued Medical Work up  Expected Discharge Plan and Services Expected Discharge Plan: Home w Home Health Services In-house Referral: Clinical Social Work Discharge Planning Services: CM Consult Post Acute Care Choice: Home Health Living arrangements for the past 2 months: Single Family Home                           HH Arranged: PT, OT, Social Work, Disease Management HH Agency: [facility] Date HH Agency Contacted: 04/04/22 Time HH Agency Contacted: 1245 Representative spoke with at [facility] Agency: Morrie Sheldon   Social Determinants of Health (SDOH) Interventions    Readmission Risk Interventions     No data to display

## 2022-04-30 NOTE — Progress Notes (Signed)
Mobility Specialist Progress Note   04/30/22 1440  Mobility  Activity Ambulated with assistance in hallway  Level of Assistance Standby assist, set-up cues, supervision of patient - no hands on  Assistive Device Front wheel walker  Distance Ambulated (ft) 220 ft  Activity Response Tolerated well  $Mobility charge 1 Mobility   Received pt in chair having no complaints and agreeable to mobility. Pt was asymptomatic throughout ambulation and returned to room w/o fault. Left in chair w/ call bell in reach and all needs met.  Holland Falling Mobility Specialist MS [facility] #:  [phone] Acute Rehab Office:  [phone]

## 2022-04-30 NOTE — Progress Notes (Signed)
Physical Therapy Treatment Patient Details Name: [name] MRN: [id number] DOB: 1961-04-06 Today's Date: 04/30/2022   History of Present Illness 61 y.o. female admitted 6/16 for T10-L3 PLIF . 6/24 wound exploration with repair of CSF leak.  S/p lumbar I&D 04/08/22.  PMH: anemia, anxiety, fibromyalgia, GERD, HTN, hyperthyroidism, IBS, ACDF 3/4    PT Comments    Patient progressing well towards PT goals. Session focused on stair training and ambulation progression. Pt eager to return home today. Requires supervision for all mobility with use of RW and Min guard assist for stair training due to BLE weakness. Reviewed back precautions during functional mobility. Continue to recommend supervision at home for safety. Will follow.    Recommendations for follow up therapy are one component of a multi-disciplinary discharge planning process, led by the attending physician.  Recommendations may be updated based on patient status, additional functional criteria and insurance authorization.  Follow Up Recommendations  Home health PT Can patient physically be transported by private vehicle: Yes   Assistance Recommended at Discharge Intermittent Supervision/Assistance  Patient can return home with the following A little help with bathing/dressing/bathroom;Help with stairs or ramp for entrance;Assist for transportation;Assistance with cooking/housework;A little help with walking and/or transfers   Equipment Recommendations  None recommended by PT    Recommendations for Other Services       Precautions / Restrictions Precautions Precautions: Back;Fall;Other (comment) Precaution Booklet Issued: No Precaution Comments: wound vac Restrictions Weight Bearing Restrictions: No Other Position/Activity Restrictions: no brace needed     Mobility  Bed Mobility               General bed mobility comments: Up in chair upon PT arrival.    Transfers Overall transfer level: Needs  assistance Equipment used: Rolling walker (2 wheels) Transfers: Sit to/from Stand Sit to Stand: Supervision           General transfer comment: Supervision for safety. Stoos from chair x2, from toilet x1.    Ambulation/Gait Ambulation/Gait assistance: Supervision Gait Distance (Feet): 200 Feet Assistive device: Rolling walker (2 wheels) Gait Pattern/deviations: Step-through pattern, Trunk flexed, Decreased stride length Gait velocity: reduced     General Gait Details: Slow, mostly steady gait with frequent stops.   Stairs Stairs: Yes   Stair Management: One rail Right, Step to pattern, Forwards Number of Stairs: 3 General stair comments: Ascends leading with Lft foot and bil hands on Rt rail, semi-turned body. Descends leading with Rt foot and Rt hand on Rt rail, forward. No LOB, but noted to be mildly effortful up and guarded descenting, min guard for safety.   Wheelchair Mobility    Modified Rankin (Stroke Patients Only)       Balance Overall balance assessment: Needs assistance Sitting-balance support: Feet supported, No upper extremity supported Sitting balance-[name]: Good     Standing balance support: During functional activity Standing balance-[name]: Fair Standing balance comment: Reliant on UE support, Able to stand at sink and perform ADL tasks without difficulty, needing UE support.                            Cognition Arousal/Alertness: Awake/alert Behavior During Therapy: WFL for tasks assessed/performed Overall Cognitive Status: Within Functional Limits for tasks assessed                                 General Comments: Declines wearing gait belt  for stair training; "if i fall, it is on me anyways so no."        Exercises      General Comments General comments (skin integrity, edema, etc.): VSS on RA.      Pertinent Vitals/Pain Pain Assessment Pain Assessment: Faces Faces Pain Scale: Hurts a little  bit Pain Location: back/ generalized Pain Descriptors / Indicators: Aching, Sore Pain Intervention(s): Monitored during session    Home Living                          Prior Function            PT Goals (current goals can now be found in the care plan section) Progress towards PT goals: Progressing toward goals    Frequency    Min 3X/week      PT Plan Current plan remains appropriate    Co-evaluation              AM-PAC PT "6 Clicks" Mobility   Outcome Measure  Help needed turning from your back to your side while in a flat bed without using bedrails?: A Little Help needed moving from lying on your back to sitting on the side of a flat bed without using bedrails?: A Little Help needed moving to and from a bed to a chair (including a wheelchair)?: A Little Help needed standing up from a chair using your arms (e.g., wheelchair or bedside chair)?: A Little Help needed to walk in hospital room?: A Little Help needed climbing 3-5 steps with a railing? : A Little 6 Click Score: 18    End of Session Equipment Utilized During Treatment:  (refused gait belt) Activity Tolerance: Patient tolerated treatment well Patient left: in chair;with call bell/phone within reach;with nursing/sitter in room Nurse Communication: Mobility status PT Visit Diagnosis: Other abnormalities of gait and mobility (R26.89);Difficulty in walking, not elsewhere classified (R26.2);Pain     Time: 6301-6010 PT Time Calculation (min) (ACUTE ONLY): 31 min  Charges:  $Gait Training: 8-22 mins $Therapeutic Activity: 8-22 mins                     Vale Haven, PT, DPT Acute Rehabilitation Services Secure chat preferred Office [phone]      Blake Divine A Miki Blank 04/30/2022, 1:15 PM

## 2022-04-30 NOTE — Discharge Summary (Signed)
Physician Discharge Summary  Patient ID: [name] MRN: [id number] DOB/AGE: 1960/12/21 61 y.o.  Admit date: 03/22/2022 Discharge date: 04/30/2022  Admission Diagnoses:degenerative scoliosis thoracolumbar spine, spondylolisthesis lumbar spine  Discharge Diagnoses:  Principal Problem:   Spondylolisthesis of lumbar region Active Problems:   Scoliosis due to degenerative disease of spine in adult patient   Postoperative wound infection   Discharged Condition: good  Hospital Course: [name] is a 61 y.o. female Whom was admitted and taken to the operating room for a t10-L3 posterolateral arthrodesis with segmental instrumentation. She did well, and had a small csf leak during the case. Her wound dehisced post op while in the hospital. I therefore took her back to the OR to fix the leak. She did well until she complained of severe pain in the back, spiked a fever, and had purulent drainage. I took her back to the OR, debrided and packed the wound open. The organism identified was enterobacter cloacae, which was not contracted in the operating room. She is being treated with cefepime, and planned for 6 weeks.  She is moving both lower extremities. I plan on readmitting after approximately two weeks to close the wound primarily.   Treatments: surgery: Thoracic ten to Lumbar three Posterior lateral arthrodesis    THORACIC WOUND EXPLORATION WITH REPAIR OF CSF LEAK   LUMBAR WOUND DEBRIDEMENT     Discharge Exam: Blood pressure 128/66, pulse 66, temperature 97.6 F (36.4 C), temperature source Oral, resp. rate 16, height _0  (1.6 m), weight 93.9 kg, last menstrual period 11/20/2012, SpO2 97 %. General appearance: alert, appears older than stated age, and mild distress Incision/Wound: wound vac in place. Wound base is clean  Disposition: Discharge disposition: 01-Home or Self Care      Thoracolumbar Infection Discharge Instructions     [facility] pharmacist  to adjust dose for Vancomycin, Aminoglycosides and other anti-infective therapies as requested by physician.   Complete by: As directed    [facility] to provide Cath Flo 66m   Complete by: As directed    Administer for PICC line occlusion and as ordered by physician for other access device issues.   Anaphylaxis Kit: Provided to treat any anaphylactic reaction to the medication being provided to the patient if First Dose or when requested by physician   Complete by: As directed    Epinephrine 131mml vial / amp: Administer 0.38m56m0.38ml38mubcutaneously once for moderate to severe anaphylaxis, nurse to call physician and pharmacy when reaction occurs and call 911 if needed for immediate care   Diphenhydramine 50mg67mIV vial: Administer 25-50mg 9mM PRN for first dose reaction, rash, itching, mild reaction, nurse to call physician and pharmacy when reaction occurs   Sodium Chloride 0.9% NS 500ml I42mdminister if needed for hypovolemic blood pressure drop or as ordered by physician after call to physician with anaphylactic reaction   Change dressing on IV access line weekly and PRN   Complete by: As directed    Flush IV access with Sodium Chloride 0.9% and Heparin 10 units/ml or 100 units/ml   Complete by: As directed    Home infusion instructions - [facility]   Complete by: As directed    Instructions: Flush IV access with Sodium Chloride 0.9% and Heparin 10units/ml or 100units/ml   Change dressing on IV access line: Weekly and PRN   Instructions Cath Flo 2mg: Ad638mister for PICC Line occlusion and as ordered by physician for other access device   [facility] pharmacist to adjust dose for: Vancomycin, Aminoglycosides and other anti-infective therapies as requested by physician   Method of administration may be changed at the discretion of home infusion pharmacist based upon assessment of the patient and/or caregiver's ability to self-administer the medication  ordered   Complete by: As directed       Allergies as of 04/30/2022       Reactions   Monosodium Glutamate Anaphylaxis, Swelling   Eyes swollen shut, facial swelling, tongue swelling.   Shellfish Allergy Anaphylaxis   Baclofen Other (See Comments)   Celebrex [celecoxib] Itching   Only allergic to generic brand   Contrast Media [iodinated Contrast Media] Itching, Nausea Only   "could not walk"   Diclofenac Itching   Generic Diclefenac gel causes itching. Can take the name brand Voltaren gel   Molds & Smuts Other (See Comments)   Other Other (See Comments)   Pet dander, Mildew, Mold Cannot tolerate generic DICLOFENAC GEL--MUST USE BRAND NAME (GENERIC CAUSES ITCHING)   Sulfa Antibiotics    Latex Itching, Rash   Latex glove with powder        Medication List     TAKE these medications    acetaminophen 500 MG tablet Commonly known as: TYLENOL Take 1,000 mg by mouth every 8 (eight) hours as needed for moderate pain.   albuterol 108 (90 Base) MCG/ACT inhaler Commonly known as: VENTOLIN HFA Inhale 2 puffs into the lungs every 6 (six) hours as needed for wheezing or shortness of breath.   ascorbic acid 500 MG tablet Commonly known as: VITAMIN C Take 500 mg by mouth daily.   aspirin EC 81 MG tablet Take 81 mg by mouth daily.   atenolol 50 MG tablet Commonly known as: TENORMIN Take 1 tablet (50 mg total) by mouth daily.   ceFEPime  IVPB Commonly known as: MAXIPIME Inject 2 g into the vein every 8 (eight) hours. Indication:  Enterobacter cloacae lumbar wound infection  First Dose: Yes Last Day of Therapy:  06/02/22  Labs - Once weekly:  CBC/D and BMP, Labs - Every other week:  ESR and CRP Method of administration: IV Push Method of administration may be changed at the discretion of home infusion pharmacist based upon assessment of the patient and/or caregiver's ability to self-administer the medication ordered.   CeleBREX 200 MG capsule Generic drug: celecoxib Take  1 capsule (200 mg total) by mouth 2 (two) times daily.   chlorzoxazone 500 MG tablet Commonly known as: PARAFON Take 1 tablet (500 mg total) by mouth 3 (three) times daily.   cyclobenzaprine 10 MG tablet Commonly known as: FLEXERIL Take 10 mg by mouth 3 (three) times daily.   diazepam 5 MG tablet Commonly known as: VALIUM Take 1 tablet (5 mg total) by mouth 2 (two) times daily.   diclofenac 1.3 % Ptch Commonly known as: FLECTOR Place 1 patch onto the skin 2 (two) times daily as needed (pain.).   diclofenac Sodium 1 % Gel Commonly known as: VOLTAREN Apply 2 g topically 4 (four) times daily as needed (pain).   Diethylpropion HCl CR 75 MG Tb24 Take 75 mg by mouth daily.   diphenhydrAMINE 25 MG tablet Commonly known as: BENADRYL Take 50 mg by mouth in the morning, at noon, in the evening, and at bedtime.   Emergen-C Immune Plus/Vit D Chew Chew 3 each by mouth daily.   furosemide 20 MG tablet Commonly known as: LASIX Take 2 tablets (40 mg total) by mouth daily.   gabapentin  600 MG tablet Commonly known as: NEURONTIN Take 2 tablets (1,200 mg total) by mouth 3 (three) times daily.   guaiFENesin 600 MG 12 hr tablet Commonly known as: MUCINEX Take 600 mg by mouth 2 (two) times daily as needed for cough.   hydrochlorothiazide 25 MG tablet Commonly known as: HYDRODIURIL Take 1 tablet (25 mg total) by mouth daily. One tablet by mouth in the morning. What changed:  how much to take additional instructions   hydrOXYzine 50 MG tablet Commonly known as: ATARAX Take 50 mg by mouth 2 (two) times daily.   lubiprostone 24 MCG capsule Commonly known as: Amitiza Take 1 capsule (24 mcg total) by mouth 2 (two) times daily with a meal.   methocarbamol 500 MG tablet Commonly known as: ROBAXIN Take 1 tablet (500 mg total) by mouth every 6 (six) hours as needed for muscle spasms.   multivitamin with minerals Tabs tablet Take 1 tablet by mouth daily.   Narcan 4 MG/0.1ML Liqd  nasal spray kit Generic drug: naloxone Place 1 spray into the nose once.   omeprazole 20 MG capsule Commonly known as: PRILOSEC Take 20 mg by mouth in the morning and at bedtime.   oxyCODONE 15 MG immediate release tablet Commonly known as: Roxicodone Take 1 tablet (15 mg total) by mouth every 6 (six) hours as needed for up to 8 days for pain.   oxyCODONE-acetaminophen 10-325 MG tablet Commonly known as: PERCOCET Take 1 tablet by mouth every 6 (six) hours as needed for pain. What changed: when to take this   potassium chloride 10 MEQ tablet Commonly known as: KLOR-CON Take 1 tablet (10 mEq total) by mouth daily.   senna 8.6 MG Tabs tablet Commonly known as: SENOKOT Take 1 tablet by mouth 2 (two) times daily as needed (constipation.).   Systane Complete 0.6 % Soln Generic drug: Propylene Glycol Place 1 drop into both eyes daily as needed (dry eyes).   zolpidem 10 MG tablet Commonly known as: AMBIEN Take 10 mg by mouth at bedtime as needed for sleep.               Discharge Care Instructions  (From admission, onward)           Start     Ordered   04/30/22 0000  Change dressing on IV access line weekly and PRN  (Home infusion instructions - [facility] )        04/30/22 1631            Follow-up Information     [facility] Follow up.   Why: your home health service provider        Ashok Pall, MD Follow up.   Specialty: Neurosurgery Why: we will admit you for elective closure of the wound Contact information: [address] [place] [address] [phone]                 Signed: Ashok Pall 04/30/2022, 4:58 PM

## 2022-04-30 NOTE — Progress Notes (Signed)
Occupational Therapy Treatment Patient Details Name: [name] MRN: [id number] DOB: 1961-07-12 Today's Date: 04/30/2022   History of present illness 61 y.o. female admitted 6/16 for T10-L3 PLIF . 6/24 wound exploration with repair of CSF leak.  S/p lumbar I&D 04/08/22.  PMH: anemia, anxiety, fibromyalgia, GERD, HTN, hyperthyroidism, IBS, ACDF 3/4   OT comments  [name] is progressing well with plans to d/c home tomorrow. Supervision provided throughout for toileting, ambulation and grooming however no physical assist or cues needed. Pt education on AE, and she demonstrated and verbalized great understanding while maintaining back precautions for LB ADLs. Pt has reacher and sockaide at home. POC remains appropriate.    Recommendations for follow up therapy are one component of a multi-disciplinary discharge planning process, led by the attending physician.  Recommendations may be updated based on patient status, additional functional criteria and insurance authorization.    Follow Up Recommendations  Home health OT    Assistance Recommended at Discharge Intermittent Supervision/Assistance  Patient can return home with the following  Assist for transportation;Assistance with cooking/housework   Equipment Recommendations  None recommended by OT    Recommendations for Other Services      Precautions / Restrictions Precautions Precautions: Back;Fall;Other (comment) Precaution Booklet Issued: No Precaution Comments: wound vac Restrictions Weight Bearing Restrictions: No Other Position/Activity Restrictions: no brace needed       Mobility Bed Mobility               General bed mobility comments: in chair    Transfers Overall transfer level: Needs assistance Equipment used: Rolling walker (2 wheels) Transfers: Sit to/from Stand Sit to Stand: Supervision                 Balance Overall balance assessment: Needs assistance Sitting-balance support: Feet  supported, No upper extremity supported Sitting balance-[name] Scale: Good     Standing balance support: During functional activity Standing balance-[name] Scale: Fair                             ADL either performed or assessed with clinical judgement   ADL Overall ADL's : Needs assistance/impaired     Grooming: Wash/dry hands;Supervision/safety;Standing               Lower Body Dressing: Supervision/safety;Cueing for back precautions;Cueing for compensatory techniques;With adaptive equipment Lower Body Dressing Details (indicate cue type and reason): reacher and sockaide education Toilet Transfer: Supervision/safety;Rolling walker (2 wheels);Regular Toilet           Functional mobility during ADLs: Supervision/safety;Rolling walker (2 wheels) General ADL Comments: reviewed LB AE education and compensatory techniques. pt demosntrated adn verbalized good understanding    Extremity/Trunk Assessment Upper Extremity Assessment Upper Extremity Assessment: Overall WFL for tasks assessed   Lower Extremity Assessment Lower Extremity Assessment: Defer to PT evaluation        Vision   Vision Assessment?: No apparent visual deficits   Perception Perception Perception: Within Functional Limits   Praxis Praxis Praxis: Intact    Cognition Arousal/Alertness: Awake/alert Behavior During Therapy: WFL for tasks assessed/performed Overall Cognitive Status: Within Functional Limits for tasks assessed Area of Impairment: Memory, Attention, Problem solving, Safety/judgement                   Current Attention Level: Selective Memory: Decreased recall of precautions, Decreased short-term memory   Safety/Judgement: Decreased awareness of safety, Decreased awareness of deficits   Problem Solving: Requires verbal cues, Slow processing  General Comments: continues to demonstrate limited insight to safety - good recall of AE education though        Exercises       Shoulder Instructions       General Comments VSS onRA    Pertinent Vitals/ Pain       Pain Assessment Pain Assessment: No/denies pain Pain Intervention(s): Monitored during session  Home Living                                          Prior Functioning/Environment              Frequency  Min 2X/week        Progress Toward Goals  OT Goals(current goals can now be found in the care plan section)  Progress towards OT goals: Progressing toward goals  Acute Rehab OT Goals Patient Stated Goal: hoem tomorrow OT Goal Formulation: With patient Time For Goal Achievement: 05/01/22 Potential to Achieve Goals: Good ADL Goals Pt Will Perform Eating: with modified independence;with adaptive utensils;sitting Pt Will Perform Lower Body Bathing: with supervision;sit to/from stand Pt Will Perform Lower Body Dressing: with supervision;sit to/from stand Pt Will Transfer to Toilet: with modified independence;ambulating;regular height toilet Additional ADL Goal #1: Independent with log roll with no VC's prior to ADL  Plan Discharge plan needs to be updated    Co-evaluation                 AM-PAC OT "6 Clicks" Daily Activity     Outcome Measure   Help from another person eating meals?: None Help from another person taking care of personal grooming?: A Little Help from another person toileting, which includes using toliet, bedpan, or urinal?: A Little Help from another person bathing (including washing, rinsing, drying)?: A Little Help from another person to put on and taking off regular upper body clothing?: None Help from another person to put on and taking off regular lower body clothing?: A Little 6 Click Score: 20    End of Session Equipment Utilized During Treatment: Rolling walker (2 wheels)  OT Visit Diagnosis: Unsteadiness on feet (R26.81);Muscle weakness (generalized) (M62.81);Pain   Activity Tolerance Patient tolerated treatment well    Patient Left in bed;with call bell/phone within reach   Nurse Communication Mobility status        Time: 9417-4081 OT Time Calculation (min): 17 min  Charges: OT General Charges $OT Visit: 1 Visit OT Treatments $Self Care/Home Management : 8-22 mins    [name] A [name] 04/30/2022, 5:09 PM

## 2022-04-30 NOTE — Progress Notes (Signed)
Patient  accidentally pulled out wound vac sitting the chair: vac unable to suction due to leak. Reinforced clear wrap 

## 2022-05-01 MED ORDER — SODIUM CHLORIDE 0.9% FLUSH
10.0000 mL | Freq: Two times a day (BID) | INTRAVENOUS | Status: DC
  Administered 2022-05-01: 10 mL

## 2022-05-01 MED ORDER — SODIUM CHLORIDE 0.9% FLUSH: 10.0000 mL | INTRAVENOUS | Status: DC | PRN

## 2022-05-01 NOTE — Progress Notes (Signed)
Pt with d/c orders home with [facility]. Discharge packet and education provided all questions answered. Pt is stable. PICC in place and pt has received education. Wound vac changed to home vac. Pt and all belongings transported via wheelchair to private vehicle.

## 2022-05-01 NOTE — Progress Notes (Signed)
Physical Therapy Treatment Patient Details Name: [name] MRN: [id number] DOB: [birth year] Today's Date: 05/01/2022   History of Present Illness 61 y.o. female admitted 6/16 for T10-L3 PLIF . 6/24 wound exploration with repair of CSF leak.  S/p lumbar I&D 04/08/22.  PMH: anemia, anxiety, fibromyalgia, GERD, HTN, hyperthyroidism, IBS, ACDF 3/4    PT Comments    Pt requiring encouragement and education to participate today. Pt eventually agreeable, even though provided option for PT to return later if desired. Pt declined stair training due to pain today, thus focused session on reviewing spinal precautions and maintaining them during all functional mobility, progressing gait, and educating her on car transfers in preparation for d/c home. Will continue to follow acutely. Current recommendations remain appropriate.     Recommendations for follow up therapy are one component of a multi-disciplinary discharge planning process, led by the attending physician.  Recommendations may be updated based on patient status, additional functional criteria and insurance authorization.  Follow Up Recommendations  Home health PT Can patient physically be transported by private vehicle: Yes   Assistance Recommended at Discharge Intermittent Supervision/Assistance  Patient can return home with the following A little help with bathing/dressing/bathroom;Help with stairs or ramp for entrance;Assist for transportation;Assistance with cooking/housework;A little help with walking and/or transfers   Equipment Recommendations  None recommended by PT    Recommendations for Other Services       Precautions / Restrictions Precautions Precautions: Back;Fall;Other (comment) Precaution Booklet Issued: Yes (comment) Precaution Comments: wound vac Required Braces or Orthoses:  (no brace needed) Restrictions Weight Bearing Restrictions: No     Mobility  Bed Mobility Overal bed mobility: Needs Assistance Bed  Mobility: Rolling, Sidelying to Sit Rolling: Supervision Sidelying to sit: Supervision       General bed mobility comments: Supervision for safety and to maintain spinal precautions    Transfers Overall transfer level: Needs assistance Equipment used: Rolling walker (2 wheels) Transfers: Sit to/from Stand Sit to Stand: Supervision           General transfer comment: Supervision for safety, no LOB    Ambulation/Gait Ambulation/Gait assistance: Supervision Gait Distance (Feet): 300 Feet (x2 bouts of ~20 ft > ~300 ft) Assistive device: Rolling walker (2 wheels) Gait Pattern/deviations: Step-through pattern, Trunk flexed, Decreased stride length Gait velocity: reduced Gait velocity interpretation: <1.8 ft/sec, indicate of risk for recurrent falls   General Gait Details: Slow, mostly steady gait, no LOB, supervision for safety   Stairs             Wheelchair Mobility    Modified Rankin (Stroke Patients Only)       Balance Overall balance assessment: Needs assistance Sitting-balance support: Feet supported, No upper extremity supported Sitting balance-[name]: Good Sitting balance - Comments: static sitting EOB   Standing balance support: Bilateral upper extremity supported, During functional activity, No upper extremity supported Standing balance-[name]: Fair Standing balance comment: Able to stand and take a few steps or reach off BOS without UE support or LOB, reliant on RW for further mobility                            Cognition Arousal/Alertness: Awake/alert Behavior During Therapy: WFL for tasks assessed/performed Overall Cognitive Status: Within Functional Limits for tasks assessed  General Comments: Pt initially seemed to understand why PT was there then began to deny mobility due to pain. PT tried to educate pt that PT could return later once she receives pain meds or could attempt PT  now and that plan is to go home today and want to ensure pt can work through the pain the mobilize safely in her home. Pt then began stating "I am going home. No one is going to stop me!" and stating "I don't have any issues with stairs" even though chart review reported some difficulty with stairs last session. Pt then calmed down and was agreeable to mobility at this time even though provided option for PT to return later.        Exercises      General Comments General comments (skin integrity, edema, etc.): educated pt on car transfers      Pertinent Vitals/Pain Pain Assessment Pain Assessment: Faces Faces Pain Scale: Hurts little more Pain Location: back/ generalized Pain Descriptors / Indicators: Discomfort, Guarding Pain Intervention(s): Limited activity within patient's tolerance, Monitored during session, Repositioned    Home Living                          Prior Function            PT Goals (current goals can now be found in the care plan section) Acute Rehab PT Goals Patient Stated Goal: to go home today PT Goal Formulation: With patient Time For Goal Achievement: 05/06/22 Potential to Achieve Goals: Good Progress towards PT goals: Progressing toward goals    Frequency    Min 3X/week      PT Plan Current plan remains appropriate    Co-evaluation              AM-PAC PT "6 Clicks" Mobility   Outcome Measure  Help needed turning from your back to your side while in a flat bed without using bedrails?: A Little Help needed moving from lying on your back to sitting on the side of a flat bed without using bedrails?: A Little Help needed moving to and from a bed to a chair (including a wheelchair)?: A Little Help needed standing up from a chair using your arms (e.g., wheelchair or bedside chair)?: A Little Help needed to walk in hospital room?: A Little Help needed climbing 3-5 steps with a railing? : A Little 6 Click Score: 18    End of  Session   Activity Tolerance: Patient tolerated treatment well Patient left: in chair;with call bell/phone within reach;with chair alarm set   PT Visit Diagnosis: Other abnormalities of gait and mobility (R26.89);Difficulty in walking, not elsewhere classified (R26.2);Pain Pain - part of body:  (back)     Time: 9024-0973 PT Time Calculation (min) (ACUTE ONLY): 24 min  Charges:  $Gait Training: 8-22 mins $Therapeutic Activity: 8-22 mins                     Raymond Gurney, PT, DPT Acute Rehabilitation Services  Office: [phone]    Jewel Baize 05/01/2022, 1:49 PM

## 2022-05-01 NOTE — Progress Notes (Signed)
Mobility Specialist Progress Note  Pt prepping for d/c home and requesting/requiring no further assistance. Mobility Specialist no longer required.  Frederico Hamman Mobility Specialist MS [facility] #:  [phone] Acute Rehab Office:  [phone]

## 2022-05-01 NOTE — Progress Notes (Signed)
Peripherally Inserted Central Catheter Placement  The IV Nurse has discussed with the patient and/or persons authorized to consent for the patient, the purpose of this procedure and the potential benefits and risks involved with this procedure.  The benefits include less needle sticks, lab draws from the catheter, and the patient may be discharged home with the catheter. Risks include, but not limited to, infection, bleeding, blood clot (thrombus formation), and puncture of an artery; nerve damage and irregular heartbeat and possibility to perform a PICC exchange if needed/ordered by physician.  Alternatives to this procedure were also discussed.  Bard Power PICC patient education guide, fact sheet on infection prevention and patient information card has been provided to patient /or left at bedside.    PICC Placement Documentation  PICC Single Lumen 05/01/22 Right Basilic 35 cm 0 cm (Active)  Indication for Insertion or Continuance of Line Home intravenous therapies (PICC only) 05/01/22 1000  Exposed Catheter (cm) 0 cm 05/01/22 1000  Site Assessment Clean, Dry, Intact 05/01/22 1000  Line Status Flushed;Blood return noted;Saline locked 05/01/22 1000  Dressing Type Transparent 05/01/22 1000  Dressing Status Clean, Dry, Intact;Antimicrobial disc in place 05/01/22 1000  Dressing Change Due 05/08/22 05/01/22 1000       Stacie Glaze Horton 05/01/2022, 10:05 AM

## 2022-05-01 NOTE — TOC Progression Note (Addendum)
Transition of Care [facility]) - Progression Note    Patient Details  Name: ATHEA HALEY MRN: [id number] Date of Birth: 1961/06/11  [name] CM/SW Contact  Beckie Busing, RN Phone Number:[phone]  05/01/2022, 9:19 AM  Clinical Narrative:    TOC continues to follow awaiting PICC placement for discharge.   1340 Per Pam with [facility] patient will be ready for discharge after 2pm dose of antibiotics and the 10pm dose will be delivered to the home. Cm made [facility] aware that patient is discharging today. No other needs noted at this time.    Expected Discharge Plan: Home w Home Health Services Barriers to Discharge: Continued Medical Work up  Expected Discharge Plan and Services Expected Discharge Plan: Home w Home Health Services In-house Referral: Clinical Social Work Discharge Planning Services: CM Consult Post Acute Care Choice: Home Health Living arrangements for the past 2 months: Single Family Home Expected Discharge Date: 04/30/22                         HH Arranged: PT, OT, Social Work, Disease Management HH Agency: [facility] ([facility]) Date HH Agency Contacted: 04/04/22 Time HH Agency Contacted: 1245 Representative spoke with at [facility] Agency: Morrie Sheldon   Social Determinants of Health (SDOH) Interventions    Readmission Risk Interventions     No data to display

## 2022-05-02 ENCOUNTER — Other Ambulatory Visit: Payer: Self-pay

## 2022-05-02 ENCOUNTER — Telehealth: Payer: Self-pay

## 2022-05-02 ENCOUNTER — Encounter (HOSPITAL_COMMUNITY): Payer: Self-pay

## 2022-05-02 ENCOUNTER — Emergency Department: Payer: Self-pay

## 2022-05-02 ENCOUNTER — Emergency Department (HOSPITAL_COMMUNITY)
Admission: EM | Admit: 2022-05-02 | Discharge: 2022-05-03 | Disposition: A | Discharge status: Home / Self Care | Payer: Medicare Other | Attending: Emergency Medicine | Admitting: Emergency Medicine

## 2022-05-02 DIAGNOSIS — R6889 Other general symptoms and signs: Secondary | ICD-10-CM | POA: Diagnosis not present

## 2022-05-02 DIAGNOSIS — Z4689 Encounter for fitting and adjustment of other specified devices: Secondary | ICD-10-CM | POA: Diagnosis not present

## 2022-05-02 DIAGNOSIS — Z95828 Presence of other vascular implants and grafts: Secondary | ICD-10-CM | POA: Diagnosis not present

## 2022-05-02 DIAGNOSIS — Z743 Need for continuous supervision: Secondary | ICD-10-CM | POA: Diagnosis not present

## 2022-05-02 DIAGNOSIS — G4733 Obstructive sleep apnea (adult) (pediatric): Secondary | ICD-10-CM | POA: Diagnosis not present

## 2022-05-02 DIAGNOSIS — Z959 Presence of cardiac and vascular implant and graft, unspecified: Secondary | ICD-10-CM | POA: Diagnosis not present

## 2022-05-02 DIAGNOSIS — Z452 Encounter for adjustment and management of vascular access device: Secondary | ICD-10-CM | POA: Diagnosis not present

## 2022-05-02 MED ORDER — CHLORHEXIDINE GLUCONATE CLOTH 2 % EX PADS
6.0000 | MEDICATED_PAD | Freq: Every day | CUTANEOUS | Status: DC
Start: 2022-05-03 — End: 2022-05-03

## 2022-05-02 MED ORDER — SODIUM CHLORIDE 0.9% FLUSH
10.0000 mL | Freq: Two times a day (BID) | INTRAVENOUS | Status: DC
  Administered 2022-05-02: 10 mL

## 2022-05-02 MED ORDER — SODIUM CHLORIDE 0.9 % IV SOLN
2.0000 g | Freq: Once | INTRAVENOUS | Status: AC
  Administered 2022-05-02: 2 g via INTRAVENOUS
  Filled 2022-05-02: qty 12.5

## 2022-05-02 MED ORDER — SODIUM CHLORIDE 0.9% FLUSH: 10.0000 mL | INTRAVENOUS | Status: DC | PRN

## 2022-05-02 NOTE — Progress Notes (Signed)
OL:[facility] order, spoke to ED RN ;patient still in the waiting room;stated that No room available right now for PICC to be inserted.

## 2022-05-02 NOTE — ED Notes (Signed)
IV team at bedside to place PICC line.

## 2022-05-02 NOTE — Discharge Instructions (Addendum)
Continue your antibiotics every 8 hours as scheduled.  Wound VAC changes as prescribed and home health will help you with that.  You did get a dose of the antibiotic at midnight tonight and you can continue the next dose at 8 AM tomorrow morning.

## 2022-05-02 NOTE — ED Provider Notes (Signed)
[facility] EMERGENCY DEPARTMENT Provider Note   CSN: [id number] Arrival date & time: 05/02/22  1407     History PMHx significant for scoliosis, spondylolisthesis of lumbar region, postoperative infection No chief complaint on file.   [name] is a 61 y.o. female.  Patient was recently hospitalized from 6/16-7/26 for degenerative scoliosis of thoracolumbar spine and spondylolisthesis of lumbar spine and head T10-L3 posterior lateral arthrodesis with segmental instrumentation and was found to have postop wound dehiscence which became infected.  She has a PICC line placed to receive cefepime 2 g every 8 hours.  She presents to the ED today due to accidentally ripping PICC line out this afternoon when it got caught on her sleeve.  Patient also states wound VAC been beeping and likely needs replacement.         Home Medications Prior to Admission medications   Medication Sig Start Date End Date Taking? Authorizing Provider  acetaminophen (TYLENOL) 500 MG tablet Take 1,000 mg by mouth every 8 (eight) hours as needed for moderate pain.    [provider]  albuterol (VENTOLIN HFA) 108 (90 Base) MCG/ACT inhaler Inhale 2 puffs into the lungs every 6 (six) hours as needed for wheezing or shortness of breath. 03/24/20   Medina-Vargas, Monina C, NP  ascorbic acid (VITAMIN C) 500 MG tablet Take 500 mg by mouth daily.    [provider]  aspirin EC 81 MG tablet Take 81 mg by mouth daily.    [provider]  atenolol (TENORMIN) 50 MG tablet Take 1 tablet (50 mg total) by mouth daily. 03/24/20   Medina-Vargas, Monina C, NP  ceFEPime (MAXIPIME) IVPB Inject 2 g into the vein every 8 (eight) hours. Indication:  Enterobacter cloacae lumbar wound infection  First Dose: Yes Last Day of Therapy:  06/02/22  Labs - Once weekly:  CBC/D and BMP, Labs - Every other week:  ESR and CRP Method of administration: IV Push Method of administration may be changed at  the discretion of home infusion pharmacist based upon assessment of the patient and/or caregiver's ability to self-administer the medication ordered. 04/30/22 06/15/22  Ashok Pall, MD  CELEBREX 200 MG capsule Take 1 capsule (200 mg total) by mouth 2 (two) times daily. 03/24/20   Medina-Vargas, Monina C, NP  chlorzoxazone (PARAFON) 500 MG tablet Take 1 tablet (500 mg total) by mouth 3 (three) times daily. 03/24/20   Medina-Vargas, Monina C, NP  cyclobenzaprine (FLEXERIL) 10 MG tablet Take 10 mg by mouth 3 (three) times daily.    [provider]  diazepam (VALIUM) 5 MG tablet Take 1 tablet (5 mg total) by mouth 2 (two) times daily. 03/22/20   Medina-Vargas, Monina C, NP  diclofenac (FLECTOR) 1.3 % PTCH Place 1 patch onto the skin 2 (two) times daily as needed (pain.).    [provider]  diclofenac Sodium (VOLTAREN) 1 % GEL Apply 2 g topically 4 (four) times daily as needed (pain).    [provider]  Diethylpropion HCl CR 75 MG TB24 Take 75 mg by mouth daily. 05/17/21   [provider]  diphenhydrAMINE (BENADRYL) 25 MG tablet Take 50 mg by mouth in the morning, at noon, in the evening, and at bedtime.    [provider]  furosemide (LASIX) 20 MG tablet Take 2 tablets (40 mg total) by mouth daily. 03/24/20   Medina-Vargas, Monina C, NP  gabapentin (NEURONTIN) 600 MG tablet Take 2 tablets (1,200 mg total) by mouth 3 (three) times  daily. 03/24/20   Medina-Vargas, Monina C, NP  guaiFENesin (MUCINEX) 600 MG 12 hr tablet Take 600 mg by mouth 2 (two) times daily as needed for cough.    [provider]  hydrochlorothiazide (HYDRODIURIL) 25 MG tablet Take 1 tablet (25 mg total) by mouth daily. One tablet by mouth in the morning. Patient taking differently: Take 12.5 mg by mouth daily. 03/24/20   Medina-Vargas, Monina C, NP  hydrOXYzine (ATARAX/VISTARIL) 50 MG tablet Take 50 mg by mouth 2 (two) times daily. 06/23/20   [provider]  lubiprostone (AMITIZA)  24 MCG capsule Take 1 capsule (24 mcg total) by mouth 2 (two) times daily with a meal. 03/24/20   Medina-Vargas, Monina C, NP  methocarbamol (ROBAXIN) 500 MG tablet Take 1 tablet (500 mg total) by mouth every 6 (six) hours as needed for muscle spasms. 04/30/22   Ashok Pall, MD  Multiple Vitamin (MULTIVITAMIN WITH MINERALS) TABS tablet Take 1 tablet by mouth daily.    [provider]  Multiple Vitamins-Minerals (EMERGEN-C IMMUNE PLUS/VIT D) CHEW Chew 3 each by mouth daily.    [provider]  NARCAN 4 MG/0.1ML LIQD nasal spray kit Place 1 spray into the nose once. 05/26/20   [provider]  omeprazole (PRILOSEC) 20 MG capsule Take 20 mg by mouth in the morning and at bedtime.     [provider]  oxyCODONE (ROXICODONE) 15 MG immediate release tablet Take 1 tablet (15 mg total) by mouth every 6 (six) hours as needed for up to 8 days for pain. 04/30/22 05/09/22  Ashok Pall, MD  oxyCODONE-acetaminophen (PERCOCET) 10-325 MG tablet Take 1 tablet by mouth every 6 (six) hours as needed for pain. Patient taking differently: Take 1 tablet by mouth 5 (five) times daily. 03/22/20   Medina-Vargas, Monina C, NP  potassium chloride (KLOR-CON) 10 MEQ tablet Take 1 tablet (10 mEq total) by mouth daily. 03/24/20   Medina-Vargas, Monina C, NP  Propylene Glycol (SYSTANE COMPLETE) 0.6 % SOLN Place 1 drop into both eyes daily as needed (dry eyes).    [provider]  senna (SENOKOT) 8.6 MG TABS tablet Take 1 tablet by mouth 2 (two) times daily as needed (constipation.).     [provider]  zolpidem (AMBIEN) 10 MG tablet Take 10 mg by mouth at bedtime as needed for sleep.    [provider]      Allergies    Monosodium glutamate, Shellfish allergy, Baclofen, Celebrex [celecoxib], Contrast media [iodinated contrast media], Diclofenac, Molds & smuts, Other, Sulfa antibiotics, and Latex    Review of Systems   Review of Systems  Constitutional:  Negative for  fever.  Gastrointestinal:  Negative for abdominal pain, diarrhea, nausea and vomiting.  Neurological:  Negative for headaches.    Physical Exam Updated Vital Signs BP 117/66   Pulse 70   Temp 98.1 F (36.7 C) (Oral)   Resp 18   LMP 11/20/2012   SpO2 99%  Physical Exam Constitutional:      General: She is not in acute distress.    Appearance: She is not ill-appearing, toxic-appearing or diaphoretic.  Cardiovascular:     Rate and Rhythm: Normal rate and regular rhythm.  Pulmonary:     Effort: Pulmonary effort is normal.     Breath sounds: Normal breath sounds.  Abdominal:     General: Abdomen is flat.     Palpations: Abdomen is soft.     Tenderness: There is generalized abdominal tenderness. There is no guarding.  Comments: Mild tenderness likely d/t heparin injections  Skin:    General: Skin is warm and dry.     Findings: Wound present.     Comments: Wound vac not sealed. Large deep wound approximately 10 inches long and 5 inches deep of thoracic-lumbar region. No drainage noted. No erythema or edema of skin surrounding wound.   Neurological:     General: No focal deficit present.     Mental Status: She is alert.  Psychiatric:        Mood and Affect: Mood normal.        Behavior: Behavior normal.     ED Results / Procedures / Treatments   Labs (all labs ordered are listed, but only abnormal results are displayed) Labs Reviewed - No data to display  EKG None  Radiology [place] EKG SITE RITE  Result Date: 05/02/2022 If Site Rite image not attached, placement could not be confirmed due to current cardiac rhythm.   Procedures PICC line placed by IV team    Medications Ordered in ED Medications  ceFEPIme (MAXIPIME) 2 g in sodium chloride 0.9 % 100 mL IVPB (2 g Intravenous New Bag/Given 05/02/22 2354)  sodium chloride flush (NS) 0.9 % injection 10-40 mL (10 mLs Intracatheter Given 05/02/22 2354)  sodium chloride flush (NS) 0.9 % injection 10-40 mL (has no  administration in time range)  Chlorhexidine Gluconate Cloth 2 % PADS 6 each (has no administration in time range)    ED Course/ Medical Decision Making/ A&P                           Medical Decision Making 61 year old female was discharged yesterday after hospitalization for degenerative scoliosis and spondylolisthesis lumbar spine s/p posterior lateral arthrodesis with postop wound dehiscence and infection.  After PICC line was accidentally removed by patient earlier this afternoon, new PICC line has been placed in ED. New wound VAC has also been placed. D/t PICC line being removed, she has not had Cefepime since yesterday prior to discharge. She was given cefepime 2 g IV in ED.  Patient is otherwise doing well with no complaints and stable vitals. She had no need for admission and was discharged with new PICC line and wound VAC in place.   Risk OTC drugs.    Final Clinical Impression(s) / ED Diagnoses Final diagnoses:  Status post PICC central line placement  Encounter for management of wound VAC    Rx / DC Orders ED Discharge Orders     None         Precious Gilding, DO 05/02/22 2357    Blanchie Dessert, MD 05/06/22 [phone]

## 2022-05-02 NOTE — Telephone Encounter (Signed)
Baker Pierini called with [facility] [phone]) stating she went out to see the patient today and patient has accidentally pulled out her picc line. Marcelino Duster also stated patient's wound vac is in poor condition. Per Marcelino Duster EMS is currently at the patient's home to take her to [place] ED.  Velvia Mehrer T Pricilla Loveless

## 2022-05-02 NOTE — ED Triage Notes (Signed)
Pt BIB [facility] from home c/o accidentally ripped her PICC line out and her wound vac is beeping stating it needs to be replaced. Pt was just discharged from the hospital yesterday.

## 2022-05-02 NOTE — Progress Notes (Signed)
Peripherally Inserted Central Catheter Placement  The IV Nurse has discussed with the patient and/or persons authorized to consent for the patient, the purpose of this procedure and the potential benefits and risks involved with this procedure.  The benefits include less needle sticks, lab draws from the catheter, and the patient may be discharged home with the catheter. Risks include, but not limited to, infection, bleeding, blood clot (thrombus formation), and puncture of an artery; nerve damage and irregular heartbeat and possibility to perform a PICC exchange if needed/ordered by physician.  Alternatives to this procedure were also discussed.  Bard Power PICC patient education guide, fact sheet on infection prevention and patient information card has been provided to patient /or left at bedside.    PICC Placement Documentation  PICC Single Lumen 05/02/22 Right Basilic 40 cm 0 cm (Active)  Indication for Insertion or Continuance of Line Home intravenous therapies (PICC only) 05/02/22 2238  Exposed Catheter (cm) 0 cm 05/02/22 2238  Site Assessment Clean, Dry, Intact 05/02/22 2238  Line Status Blood return noted;Flushed;Saline locked 05/02/22 2238  Dressing Type Transparent;Securing device 05/02/22 2238  Dressing Status Clean, Dry, Intact;Antimicrobial disc in place 05/02/22 2238  Safety Lock Not Applicable 05/02/22 2238  Line Care Connections checked and tightened 05/02/22 2238  Line Adjustment (NICU/IV Team Only) No 05/02/22 2238  Dressing Intervention New dressing 05/02/22 2238  Dressing Change Due 05/09/22 05/02/22 2238       Christeen Douglas 05/02/2022, 11:02 PM

## 2022-05-02 NOTE — ED Provider Triage Note (Signed)
Emergency Medicine Provider Triage Evaluation Note  [name] , a 61 y.o. female  was evaluated in triage.  Pt complains of PICC line accidentally removed while changing.Had surgery with Dr. Mikal Plane, after spine surgery. She also reports wound vac is beeping. No complaints.  Review of Systems  Positive:  Negative:   Physical Exam  LMP 11/20/2012  Gen:   Awake, no distress   Resp:  Normal effort  MSK:   Moves extremities without difficulty  Other:    Medical Decision Making  Medically screening exam initiated at 2:10 PM.  Appropriate orders placed.  [name] was informed that the remainder of the evaluation will be completed by another provider, this initial triage assessment does not replace that evaluation, and the importance of remaining in the ED until their evaluation is complete.     Jill Manges, PA-C 05/02/22 1411

## 2022-05-03 ENCOUNTER — Telehealth: Payer: Self-pay

## 2022-05-03 ENCOUNTER — Inpatient Hospital Stay: Payer: Medicare Other | Admitting: Internal Medicine

## 2022-05-03 NOTE — Telephone Encounter (Signed)
Attempted to call patient regarding missed appointment. Not able to reach her at this time. Voicemail is full and not accepting messages. Will need to reschedule appt. Juanita Laster, RMA

## 2022-05-05 DIAGNOSIS — T8132XA Disruption of internal operation (surgical) wound, not elsewhere classified, initial encounter: Secondary | ICD-10-CM | POA: Diagnosis not present

## 2022-05-05 DIAGNOSIS — M797 Fibromyalgia: Secondary | ICD-10-CM | POA: Diagnosis not present

## 2022-05-05 DIAGNOSIS — Z9181 History of falling: Secondary | ICD-10-CM | POA: Diagnosis not present

## 2022-05-05 DIAGNOSIS — Z452 Encounter for adjustment and management of vascular access device: Secondary | ICD-10-CM | POA: Diagnosis not present

## 2022-05-05 DIAGNOSIS — Z87440 Personal history of urinary (tract) infections: Secondary | ICD-10-CM | POA: Diagnosis not present

## 2022-05-05 DIAGNOSIS — Z7982 Long term (current) use of aspirin: Secondary | ICD-10-CM | POA: Diagnosis not present

## 2022-05-05 DIAGNOSIS — T8142XA Infection following a procedure, deep incisional surgical site, initial encounter: Secondary | ICD-10-CM | POA: Diagnosis not present

## 2022-05-05 DIAGNOSIS — Z79891 Long term (current) use of opiate analgesic: Secondary | ICD-10-CM | POA: Diagnosis not present

## 2022-05-05 DIAGNOSIS — Z87891 Personal history of nicotine dependence: Secondary | ICD-10-CM | POA: Diagnosis not present

## 2022-05-05 DIAGNOSIS — I10 Essential (primary) hypertension: Secondary | ICD-10-CM | POA: Diagnosis not present

## 2022-05-05 DIAGNOSIS — Z792 Long term (current) use of antibiotics: Secondary | ICD-10-CM | POA: Diagnosis not present

## 2022-05-05 DIAGNOSIS — B9689 Other specified bacterial agents as the cause of diseases classified elsewhere: Secondary | ICD-10-CM | POA: Diagnosis not present

## 2022-05-05 DIAGNOSIS — Z791 Long term (current) use of non-steroidal anti-inflammatories (NSAID): Secondary | ICD-10-CM | POA: Diagnosis not present

## 2022-05-05 DIAGNOSIS — F32A Depression, unspecified: Secondary | ICD-10-CM | POA: Diagnosis not present

## 2022-05-05 DIAGNOSIS — M4316 Spondylolisthesis, lumbar region: Secondary | ICD-10-CM | POA: Diagnosis not present

## 2022-05-05 DIAGNOSIS — K219 Gastro-esophageal reflux disease without esophagitis: Secondary | ICD-10-CM | POA: Diagnosis not present

## 2022-05-07 DIAGNOSIS — Z79891 Long term (current) use of opiate analgesic: Secondary | ICD-10-CM | POA: Diagnosis not present

## 2022-05-07 DIAGNOSIS — T8142XA Infection following a procedure, deep incisional surgical site, initial encounter: Secondary | ICD-10-CM | POA: Diagnosis not present

## 2022-05-07 DIAGNOSIS — Z87891 Personal history of nicotine dependence: Secondary | ICD-10-CM | POA: Diagnosis not present

## 2022-05-07 DIAGNOSIS — M797 Fibromyalgia: Secondary | ICD-10-CM | POA: Diagnosis not present

## 2022-05-07 DIAGNOSIS — Z792 Long term (current) use of antibiotics: Secondary | ICD-10-CM | POA: Diagnosis not present

## 2022-05-07 DIAGNOSIS — Z452 Encounter for adjustment and management of vascular access device: Secondary | ICD-10-CM | POA: Diagnosis not present

## 2022-05-07 DIAGNOSIS — Z87440 Personal history of urinary (tract) infections: Secondary | ICD-10-CM | POA: Diagnosis not present

## 2022-05-07 DIAGNOSIS — F32A Depression, unspecified: Secondary | ICD-10-CM | POA: Diagnosis not present

## 2022-05-07 DIAGNOSIS — K219 Gastro-esophageal reflux disease without esophagitis: Secondary | ICD-10-CM | POA: Diagnosis not present

## 2022-05-07 DIAGNOSIS — T8132XA Disruption of internal operation (surgical) wound, not elsewhere classified, initial encounter: Secondary | ICD-10-CM | POA: Diagnosis not present

## 2022-05-07 DIAGNOSIS — B9689 Other specified bacterial agents as the cause of diseases classified elsewhere: Secondary | ICD-10-CM | POA: Diagnosis not present

## 2022-05-07 DIAGNOSIS — Z7982 Long term (current) use of aspirin: Secondary | ICD-10-CM | POA: Diagnosis not present

## 2022-05-07 DIAGNOSIS — Z9181 History of falling: Secondary | ICD-10-CM | POA: Diagnosis not present

## 2022-05-07 DIAGNOSIS — I10 Essential (primary) hypertension: Secondary | ICD-10-CM | POA: Diagnosis not present

## 2022-05-07 DIAGNOSIS — Z791 Long term (current) use of non-steroidal anti-inflammatories (NSAID): Secondary | ICD-10-CM | POA: Diagnosis not present

## 2022-05-07 DIAGNOSIS — M4316 Spondylolisthesis, lumbar region: Secondary | ICD-10-CM | POA: Diagnosis not present

## 2022-05-10 ENCOUNTER — Other Ambulatory Visit: Payer: Self-pay | Admitting: Neurosurgery

## 2022-05-10 DIAGNOSIS — I10 Essential (primary) hypertension: Secondary | ICD-10-CM | POA: Diagnosis not present

## 2022-05-10 DIAGNOSIS — F32A Depression, unspecified: Secondary | ICD-10-CM | POA: Diagnosis not present

## 2022-05-10 DIAGNOSIS — Z792 Long term (current) use of antibiotics: Secondary | ICD-10-CM | POA: Diagnosis not present

## 2022-05-10 DIAGNOSIS — Z87891 Personal history of nicotine dependence: Secondary | ICD-10-CM | POA: Diagnosis not present

## 2022-05-10 DIAGNOSIS — Z791 Long term (current) use of non-steroidal anti-inflammatories (NSAID): Secondary | ICD-10-CM | POA: Diagnosis not present

## 2022-05-10 DIAGNOSIS — T8132XA Disruption of internal operation (surgical) wound, not elsewhere classified, initial encounter: Secondary | ICD-10-CM | POA: Diagnosis not present

## 2022-05-10 DIAGNOSIS — M797 Fibromyalgia: Secondary | ICD-10-CM | POA: Diagnosis not present

## 2022-05-10 DIAGNOSIS — Z452 Encounter for adjustment and management of vascular access device: Secondary | ICD-10-CM | POA: Diagnosis not present

## 2022-05-10 DIAGNOSIS — K219 Gastro-esophageal reflux disease without esophagitis: Secondary | ICD-10-CM | POA: Diagnosis not present

## 2022-05-10 DIAGNOSIS — Z87440 Personal history of urinary (tract) infections: Secondary | ICD-10-CM | POA: Diagnosis not present

## 2022-05-10 DIAGNOSIS — M4316 Spondylolisthesis, lumbar region: Secondary | ICD-10-CM | POA: Diagnosis not present

## 2022-05-10 DIAGNOSIS — Z9181 History of falling: Secondary | ICD-10-CM | POA: Diagnosis not present

## 2022-05-10 DIAGNOSIS — Z79891 Long term (current) use of opiate analgesic: Secondary | ICD-10-CM | POA: Diagnosis not present

## 2022-05-10 DIAGNOSIS — T8142XA Infection following a procedure, deep incisional surgical site, initial encounter: Secondary | ICD-10-CM | POA: Diagnosis not present

## 2022-05-10 DIAGNOSIS — B9689 Other specified bacterial agents as the cause of diseases classified elsewhere: Secondary | ICD-10-CM | POA: Diagnosis not present

## 2022-05-10 DIAGNOSIS — Z7982 Long term (current) use of aspirin: Secondary | ICD-10-CM | POA: Diagnosis not present

## 2022-05-12 ENCOUNTER — Other Ambulatory Visit: Payer: Self-pay

## 2022-05-12 ENCOUNTER — Inpatient Hospital Stay (HOSPITAL_COMMUNITY)
Admission: RE | Admit: 2022-05-12 | Discharge: 2022-05-17 | DRG: 858 | Disposition: A | Discharge status: Home Health | Payer: Medicare Other | Source: Ambulatory Visit | Attending: Neurosurgery | Admitting: Neurosurgery

## 2022-05-12 DIAGNOSIS — F419 Anxiety disorder, unspecified: Secondary | ICD-10-CM | POA: Diagnosis present

## 2022-05-12 DIAGNOSIS — Z811 Family history of alcohol abuse and dependence: Secondary | ICD-10-CM

## 2022-05-12 DIAGNOSIS — Z6835 Body mass index (BMI) 35.0-35.9, adult: Secondary | ICD-10-CM

## 2022-05-12 DIAGNOSIS — T8132XA Disruption of internal operation (surgical) wound, not elsewhere classified, initial encounter: Secondary | ICD-10-CM | POA: Diagnosis not present

## 2022-05-12 DIAGNOSIS — Z91013 Allergy to seafood: Secondary | ICD-10-CM | POA: Diagnosis not present

## 2022-05-12 DIAGNOSIS — Z8261 Family history of arthritis: Secondary | ICD-10-CM

## 2022-05-12 DIAGNOSIS — Z791 Long term (current) use of non-steroidal anti-inflammatories (NSAID): Secondary | ICD-10-CM | POA: Diagnosis not present

## 2022-05-12 DIAGNOSIS — T8142XD Infection following a procedure, deep incisional surgical site, subsequent encounter: Principal | ICD-10-CM

## 2022-05-12 DIAGNOSIS — K219 Gastro-esophageal reflux disease without esophagitis: Secondary | ICD-10-CM | POA: Diagnosis present

## 2022-05-12 DIAGNOSIS — T8149XA Infection following a procedure, other surgical site, initial encounter: Secondary | ICD-10-CM | POA: Diagnosis present

## 2022-05-12 DIAGNOSIS — M549 Dorsalgia, unspecified: Secondary | ICD-10-CM | POA: Diagnosis not present

## 2022-05-12 DIAGNOSIS — Z91040 Latex allergy status: Secondary | ICD-10-CM | POA: Diagnosis not present

## 2022-05-12 DIAGNOSIS — T8141XA Infection following a procedure, superficial incisional surgical site, initial encounter: Secondary | ICD-10-CM | POA: Diagnosis not present

## 2022-05-12 DIAGNOSIS — Z7982 Long term (current) use of aspirin: Secondary | ICD-10-CM

## 2022-05-12 DIAGNOSIS — Z79899 Other long term (current) drug therapy: Secondary | ICD-10-CM

## 2022-05-12 DIAGNOSIS — I10 Essential (primary) hypertension: Secondary | ICD-10-CM | POA: Diagnosis not present

## 2022-05-12 DIAGNOSIS — Z8249 Family history of ischemic heart disease and other diseases of the circulatory system: Secondary | ICD-10-CM | POA: Diagnosis not present

## 2022-05-12 DIAGNOSIS — E669 Obesity, unspecified: Secondary | ICD-10-CM | POA: Diagnosis present

## 2022-05-12 DIAGNOSIS — M797 Fibromyalgia: Secondary | ICD-10-CM | POA: Diagnosis not present

## 2022-05-12 DIAGNOSIS — Z91041 Radiographic dye allergy status: Secondary | ICD-10-CM | POA: Diagnosis not present

## 2022-05-12 DIAGNOSIS — Z823 Family history of stroke: Secondary | ICD-10-CM | POA: Diagnosis not present

## 2022-05-12 DIAGNOSIS — Y838 Other surgical procedures as the cause of abnormal reaction of the patient, or of later complication, without mention of misadventure at the time of the procedure: Secondary | ICD-10-CM | POA: Diagnosis present

## 2022-05-12 DIAGNOSIS — Z792 Long term (current) use of antibiotics: Secondary | ICD-10-CM | POA: Diagnosis not present

## 2022-05-12 DIAGNOSIS — T8142XA Infection following a procedure, deep incisional surgical site, initial encounter: Secondary | ICD-10-CM | POA: Diagnosis not present

## 2022-05-12 DIAGNOSIS — Z96653 Presence of artificial knee joint, bilateral: Secondary | ICD-10-CM | POA: Diagnosis present

## 2022-05-12 DIAGNOSIS — T8189XD Other complications of procedures, not elsewhere classified, subsequent encounter: Secondary | ICD-10-CM

## 2022-05-12 DIAGNOSIS — T8189XA Other complications of procedures, not elsewhere classified, initial encounter: Secondary | ICD-10-CM

## 2022-05-12 DIAGNOSIS — Z87891 Personal history of nicotine dependence: Secondary | ICD-10-CM | POA: Diagnosis not present

## 2022-05-12 DIAGNOSIS — G8929 Other chronic pain: Secondary | ICD-10-CM | POA: Diagnosis not present

## 2022-05-12 DIAGNOSIS — M4316 Spondylolisthesis, lumbar region: Secondary | ICD-10-CM | POA: Diagnosis not present

## 2022-05-12 DIAGNOSIS — B9689 Other specified bacterial agents as the cause of diseases classified elsewhere: Secondary | ICD-10-CM | POA: Diagnosis not present

## 2022-05-12 DIAGNOSIS — Z888 Allergy status to other drugs, medicaments and biological substances status: Secondary | ICD-10-CM

## 2022-05-12 DIAGNOSIS — Z833 Family history of diabetes mellitus: Secondary | ICD-10-CM | POA: Diagnosis not present

## 2022-05-12 DIAGNOSIS — Z452 Encounter for adjustment and management of vascular access device: Secondary | ICD-10-CM | POA: Diagnosis not present

## 2022-05-12 DIAGNOSIS — Z79891 Long term (current) use of opiate analgesic: Secondary | ICD-10-CM | POA: Diagnosis not present

## 2022-05-12 DIAGNOSIS — Z818 Family history of other mental and behavioral disorders: Secondary | ICD-10-CM

## 2022-05-12 DIAGNOSIS — F32A Depression, unspecified: Secondary | ICD-10-CM | POA: Diagnosis not present

## 2022-05-12 DIAGNOSIS — Z87440 Personal history of urinary (tract) infections: Secondary | ICD-10-CM | POA: Diagnosis not present

## 2022-05-12 DIAGNOSIS — Z881 Allergy status to other antibiotic agents status: Secondary | ICD-10-CM | POA: Diagnosis not present

## 2022-05-12 DIAGNOSIS — J45909 Unspecified asthma, uncomplicated: Secondary | ICD-10-CM | POA: Diagnosis not present

## 2022-05-12 DIAGNOSIS — Z9181 History of falling: Secondary | ICD-10-CM | POA: Diagnosis not present

## 2022-05-12 DIAGNOSIS — T8149XD Infection following a procedure, other surgical site, subsequent encounter: Secondary | ICD-10-CM | POA: Diagnosis not present

## 2022-05-12 DIAGNOSIS — Z981 Arthrodesis status: Secondary | ICD-10-CM | POA: Diagnosis not present

## 2022-05-12 DIAGNOSIS — T8140XA Infection following a procedure, unspecified, initial encounter: Secondary | ICD-10-CM | POA: Diagnosis not present

## 2022-05-12 LAB — BASIC METABOLIC PANEL
Anion gap: 7 (ref 5–15)
BUN: 13 mg/dL (ref 6–20)
CO2: 21 mmol/L — ABNORMAL LOW (ref 22–32)
Calcium: 9 mg/dL (ref 8.9–10.3)
Chloride: 111 mmol/L (ref 98–111)
Creatinine, Ser: 0.67 mg/dL (ref 0.44–1.00)
GFR, Estimated: >60 mL/min (ref >60)
Glucose, Bld: 156 mg/dL — ABNORMAL HIGH (ref 70–99)
Potassium: 4 mmol/L (ref 3.5–5.1)
Sodium: 139 mmol/L (ref 135–145)

## 2022-05-12 LAB — CBC
HCT: 28.7 % — ABNORMAL LOW (ref 36.0–46.0)
Hemoglobin: 8.2 g/dL — ABNORMAL LOW (ref 12.0–15.0)
MCH: 19.7 pg — ABNORMAL LOW (ref 26.0–34.0)
MCHC: 28.6 g/dL — ABNORMAL LOW (ref 30.0–36.0)
MCV: 69 fL — ABNORMAL LOW (ref 80.0–100.0)
Platelets: 442 10*3/uL — ABNORMAL HIGH (ref 150–400)
RBC: 4.16 MIL/uL (ref 3.87–5.11)
RDW: 19.1 % — ABNORMAL HIGH (ref 11.5–15.5)
WBC: 13 10*3/uL — ABNORMAL HIGH (ref 4.0–10.5)
nRBC: 0 % (ref 0.0–0.2)

## 2022-05-12 LAB — HIV ANTIBODY (ROUTINE TESTING W REFLEX): HIV Screen 4th Generation wRfx: NONREACTIVE

## 2022-05-12 MED ORDER — SODIUM CHLORIDE 0.9% FLUSH
10.0000 mL | INTRAVENOUS | Status: DC | PRN
  Administered 2022-05-12 – 2022-05-17 (×2): 10 mL

## 2022-05-12 MED ORDER — HYDRALAZINE HCL 20 MG/ML IJ SOLN: 10.0000 mg | INTRAMUSCULAR | Status: DC | PRN

## 2022-05-12 MED ORDER — OXYCODONE-ACETAMINOPHEN 10-325 MG PO TABS: 1.0000 | ORAL_TABLET | Freq: Four times a day (QID) | ORAL | Status: DC | PRN

## 2022-05-12 MED ORDER — DIPHENHYDRAMINE HCL 25 MG PO CAPS
25.0000 mg | ORAL_CAPSULE | Freq: Once | ORAL | Status: AC
Start: 2022-05-12 — End: 2022-05-12
  Administered 2022-05-12: 25 mg via ORAL
  Filled 2022-05-12: qty 1

## 2022-05-12 MED ORDER — FLEET ENEMA 7-19 GM/118ML RE ENEM: 1.0000 | ENEMA | Freq: Once | RECTAL | Status: DC | PRN

## 2022-05-12 MED ORDER — CEFEPIME HCL 2 G IV SOLR
2.0000 g | Freq: Three times a day (TID) | INTRAVENOUS | Status: DC
  Administered 2022-05-12 – 2022-05-17 (×14): 2 g via INTRAVENOUS
  Filled 2022-05-12 (×14): qty 12.5

## 2022-05-12 MED ORDER — LUBIPROSTONE 24 MCG PO CAPS
48.0000 ug | ORAL_CAPSULE | Freq: Every day | ORAL | Status: DC
  Administered 2022-05-13 – 2022-05-17 (×5): 48 ug via ORAL
  Filled 2022-05-12 (×6): qty 2

## 2022-05-12 MED ORDER — BISACODYL 10 MG RE SUPP: 10.0000 mg | Freq: Every day | RECTAL | Status: DC | PRN

## 2022-05-12 MED ORDER — ALBUTEROL SULFATE HFA 108 (90 BASE) MCG/ACT IN AERS: 2.0000 | INHALATION_SPRAY | Freq: Four times a day (QID) | RESPIRATORY_TRACT | Status: DC | PRN

## 2022-05-12 MED ORDER — FUROSEMIDE 40 MG PO TABS
40.0000 mg | ORAL_TABLET | Freq: Three times a day (TID) | ORAL | Status: DC
  Administered 2022-05-12 – 2022-05-17 (×14): 40 mg via ORAL
  Filled 2022-05-12 (×14): qty 1

## 2022-05-12 MED ORDER — OXYCODONE HCL 5 MG PO TABS
5.0000 mg | ORAL_TABLET | Freq: Four times a day (QID) | ORAL | Status: DC | PRN
  Administered 2022-05-12 – 2022-05-16 (×8): 5 mg via ORAL
  Filled 2022-05-12 (×9): qty 1

## 2022-05-12 MED ORDER — OXYCODONE-ACETAMINOPHEN 5-325 MG PO TABS
1.0000 | ORAL_TABLET | Freq: Four times a day (QID) | ORAL | Status: DC | PRN
  Administered 2022-05-12 – 2022-05-17 (×9): 1 via ORAL
  Filled 2022-05-12 (×9): qty 1

## 2022-05-12 MED ORDER — ALBUTEROL SULFATE (2.5 MG/3ML) 0.083% IN NEBU: 2.5000 mg | INHALATION_SOLUTION | Freq: Four times a day (QID) | RESPIRATORY_TRACT | Status: DC | PRN

## 2022-05-12 MED ORDER — GABAPENTIN 600 MG PO TABS
1200.0000 mg | ORAL_TABLET | Freq: Three times a day (TID) | ORAL | Status: DC
  Administered 2022-05-12 – 2022-05-17 (×15): 1200 mg via ORAL
  Filled 2022-05-12 (×15): qty 2

## 2022-05-12 MED ORDER — DIAZEPAM 5 MG PO TABS
5.0000 mg | ORAL_TABLET | Freq: Two times a day (BID) | ORAL | Status: DC
  Administered 2022-05-12 – 2022-05-17 (×10): 5 mg via ORAL
  Filled 2022-05-12 (×10): qty 1

## 2022-05-12 MED ORDER — ATENOLOL 25 MG PO TABS
50.0000 mg | ORAL_TABLET | Freq: Every day | ORAL | Status: DC
  Administered 2022-05-13 – 2022-05-17 (×5): 50 mg via ORAL
  Filled 2022-05-12 (×5): qty 2

## 2022-05-12 MED ORDER — CHLORHEXIDINE GLUCONATE CLOTH 2 % EX PADS
6.0000 | MEDICATED_PAD | Freq: Every day | CUTANEOUS | Status: DC
  Administered 2022-05-12 – 2022-05-17 (×6): 6 via TOPICAL

## 2022-05-12 MED ORDER — ONDANSETRON HCL 4 MG PO TABS: 4.0000 mg | ORAL_TABLET | Freq: Four times a day (QID) | ORAL | Status: DC | PRN

## 2022-05-12 MED ORDER — ACETAMINOPHEN 650 MG RE SUPP: 650.0000 mg | Freq: Four times a day (QID) | RECTAL | Status: DC | PRN

## 2022-05-12 MED ORDER — ZOLPIDEM TARTRATE 5 MG PO TABS
5.0000 mg | ORAL_TABLET | Freq: Every evening | ORAL | Status: DC | PRN
  Administered 2022-05-13 – 2022-05-16 (×2): 5 mg via ORAL
  Filled 2022-05-12 (×2): qty 1

## 2022-05-12 MED ORDER — POLYVINYL ALCOHOL 1.4 % OP SOLN
1.0000 [drp] | Freq: Every day | OPHTHALMIC | Status: DC | PRN
Start: 2022-05-12 — End: 2022-05-17

## 2022-05-12 MED ORDER — POTASSIUM CHLORIDE CRYS ER 20 MEQ PO TBCR
20.0000 meq | EXTENDED_RELEASE_TABLET | Freq: Every day | ORAL | Status: DC
  Administered 2022-05-13 – 2022-05-17 (×5): 20 meq via ORAL
  Filled 2022-05-12: qty 2
  Filled 2022-05-12 (×3): qty 1
  Filled 2022-05-12 (×2): qty 2
  Filled 2022-05-12: qty 1

## 2022-05-12 MED ORDER — METHOCARBAMOL 500 MG PO TABS
500.0000 mg | ORAL_TABLET | Freq: Four times a day (QID) | ORAL | Status: DC | PRN
  Administered 2022-05-13 – 2022-05-16 (×5): 500 mg via ORAL
  Filled 2022-05-12 (×6): qty 1

## 2022-05-12 MED ORDER — PANTOPRAZOLE SODIUM 40 MG PO TBEC
40.0000 mg | DELAYED_RELEASE_TABLET | Freq: Every day | ORAL | Status: DC
  Administered 2022-05-13 – 2022-05-17 (×5): 40 mg via ORAL
  Filled 2022-05-12 (×5): qty 1

## 2022-05-12 MED ORDER — ONDANSETRON HCL 4 MG/2ML IJ SOLN: 4.0000 mg | Freq: Four times a day (QID) | INTRAMUSCULAR | Status: DC | PRN

## 2022-05-12 MED ORDER — CEFEPIME IV (FOR PTA / DISCHARGE USE ONLY): 2.0000 g | Freq: Three times a day (TID) | INTRAVENOUS | Status: DC

## 2022-05-12 MED ORDER — ACETAMINOPHEN 325 MG PO TABS
650.0000 mg | ORAL_TABLET | Freq: Four times a day (QID) | ORAL | Status: DC | PRN
  Administered 2022-05-13 – 2022-05-16 (×7): 650 mg via ORAL
  Filled 2022-05-12 (×8): qty 2

## 2022-05-12 MED ORDER — HYDROXYZINE HCL 25 MG PO TABS
50.0000 mg | ORAL_TABLET | Freq: Two times a day (BID) | ORAL | Status: DC
  Administered 2022-05-12 – 2022-05-17 (×10): 50 mg via ORAL
  Filled 2022-05-12 (×10): qty 2

## 2022-05-12 MED ORDER — HYDROMORPHONE HCL 1 MG/ML IJ SOLN
0.5000 mg | INTRAMUSCULAR | Status: DC | PRN
  Administered 2022-05-14 – 2022-05-16 (×5): 0.5 mg via INTRAVENOUS
  Filled 2022-05-12 (×5): qty 0.5

## 2022-05-12 MED ORDER — CYCLOBENZAPRINE HCL 10 MG PO TABS
10.0000 mg | ORAL_TABLET | Freq: Three times a day (TID) | ORAL | Status: DC
  Administered 2022-05-12 – 2022-05-17 (×15): 10 mg via ORAL
  Filled 2022-05-12 (×15): qty 1

## 2022-05-12 MED ORDER — SODIUM CHLORIDE 0.9% FLUSH
10.0000 mL | Freq: Two times a day (BID) | INTRAVENOUS | Status: DC
  Administered 2022-05-12 – 2022-05-17 (×9): 10 mL

## 2022-05-12 MED ORDER — ADULT MULTIVITAMIN W/MINERALS CH
1.0000 | ORAL_TABLET | Freq: Every day | ORAL | Status: DC
  Administered 2022-05-13 – 2022-05-17 (×5): 1 via ORAL
  Filled 2022-05-12 (×5): qty 1

## 2022-05-12 MED ORDER — HYDROCHLOROTHIAZIDE 12.5 MG PO TABS
12.5000 mg | ORAL_TABLET | Freq: Every day | ORAL | Status: DC
Start: 2022-05-13 — End: 2022-05-17
  Administered 2022-05-13 – 2022-05-17 (×5): 12.5 mg via ORAL
  Filled 2022-05-12 (×5): qty 1

## 2022-05-12 MED ORDER — POLYETHYLENE GLYCOL 3350 17 G PO PACK: 17.0000 g | PACK | Freq: Every day | ORAL | Status: DC | PRN

## 2022-05-12 MED ORDER — CHLORZOXAZONE 500 MG PO TABS
500.0000 mg | ORAL_TABLET | Freq: Three times a day (TID) | ORAL | Status: DC
Start: 2022-05-12 — End: 2022-05-17
  Administered 2022-05-12 – 2022-05-17 (×15): 500 mg via ORAL
  Filled 2022-05-12 (×17): qty 1

## 2022-05-12 MED ORDER — SENNA 8.6 MG PO TABS
2.0000 | ORAL_TABLET | Freq: Every day | ORAL | Status: DC
  Administered 2022-05-13 – 2022-05-17 (×4): 17.2 mg via ORAL
  Filled 2022-05-12 (×4): qty 2

## 2022-05-12 MED ORDER — ASCORBIC ACID 500 MG PO TABS
500.0000 mg | ORAL_TABLET | Freq: Every day | ORAL | Status: DC
  Administered 2022-05-13 – 2022-05-17 (×5): 500 mg via ORAL
  Filled 2022-05-12 (×5): qty 1

## 2022-05-13 MED ORDER — DIPHENHYDRAMINE HCL 25 MG PO CAPS
50.0000 mg | ORAL_CAPSULE | Freq: Four times a day (QID) | ORAL | Status: DC | PRN
  Administered 2022-05-13 – 2022-05-16 (×7): 50 mg via ORAL
  Filled 2022-05-13 (×8): qty 2

## 2022-05-13 MED ORDER — ORAL CARE MOUTH RINSE: 15.0000 mL | OROMUCOSAL | Status: DC | PRN

## 2022-05-13 NOTE — Progress Notes (Signed)
  Transition of Care [facility]) Screening Note   Patient Details  Name: ABIE KILLIAN Date of Birth: [birth year]   Transition of Care [facility]) CM/SW Contact:    Kermit Balo, RN Phone Number: 05/13/2022, 1:56 PM   Pt is from home alone. Awaiting neurosurgery recommendations.  Transition of Care Department [facility]) has reviewed patient. We will continue to monitor patient advancement through interdisciplinary progression rounds. If new patient transition needs arise, please place a TOC consult.

## 2022-05-13 NOTE — H&P (Signed)
[name] is an 61 y.o. female.   Chief Complaint: Delayed surgical wound healing HPI: Ms. Bechler underwent a T10-L3 posterolateral arthrodesis with segmental instrumentation by Dr. Christella Noa on 03/22/2022. She returned to the OR on 03/30/2022 for repair of a CSF leak. Her surgical wound developed purulent drainage and she returned to the OR on 04/08/2022 for wound debridement and placement of a wound vac. Her wound culture grew enterobacter cloacae and the patient was started on cefepime. She was discharged home with home health services on 04/30/2022. She returned to the emergency department on 05/02/2022 as her PICC line got pulled out and her wound VAC dressing had come off. These were replaced in the ED and the patient was discharged home.  On 05/11/2022 at 5:35 pm, the patient contacted the Neurosurgery call provider to report that the wound VAC had become disconnected from her back once again. The patient wanted to avoid coming to the emergency department if at all possible. The [place] on-call provider reached out to the [facility] triage nurse and also spoke with the on-call home health nurse. Arrangements were made for the home health nurse to come out, at the latest, on 05/12/2022 first thing in the morning. The patient contacted the [place] provider again at 11:30 am to report that the [facility] nurse had not come by and her wound VAC was still disconnected. At this point, the patient's wound had not had an appropriate dressing on for almost 24 hours. The patient came in for a direct admit to evaluate her surgical wound and continue her IV antibiotics prior to her scheduled wound closure on Tuesday.  Past Medical History:  Diagnosis Date   Anemia    Ankle syndesmosis disruption    Anxiety    takes Ativan and Valium, after mother passed   Arthritis    bilateral knees s/p knee replacement bilaterally   Asthma    Bronchitis    Bruising    pt states unexplained d/t fibromyalgia   Chronic back pain     2012 tailbone surgery and 3 lower discs.    Closed fracture of distal end of right fibula and tibia    Depression    from Fibromyalgia diagnosis; not taking medicine. since 2001   Dizziness    rarely   Fibromyalgia    diagnosed 2001   GERD (gastroesophageal reflux disease)    Prilosec occasionally   Headache(784.0)    "sinus headaches"   History of hiatal hernia    Hypertension    since 2013   Hyperthyroidism    subclinical, no treatment; thyroid nodules   IBS (irritable bowel syndrome)    Impaired memory    states from fibromyalgia   Insomnia    takes Ambien   Jones fracture    left foot fifth metatarsal   Multiple allergies    including latex, pet dander, shellfish, pet dander   Painful orthopaedic hardware right ankle 05/27/2016   Seasonal allergies    Shortness of breath    Occasional with exertion;    Sore gums    this is why pt is on Amoxil-only takes for dental work   Tachycardia    Thyroid goiter    Varicose vein    protrudes above skin-per pt;vein popped and bruised;ultrasound done to make sure that there were no clots;noclots were found    Past Surgical History:  Procedure Laterality Date   ANTERIOR CERVICAL DECOMP/DISCECTOMY FUSION N/A 12/08/2020   Procedure: Cervical Four-Five Anterior cervical decompression/discectomy/fusion;  Surgeon:  Ashok Pall, MD;  Location: [place];  Service: Neurosurgery;  Laterality: N/A;  anterior   ANTERIOR LUMBAR FUSION  09/20/2011   Procedure: ANTERIOR LUMBAR FUSION 1 LEVEL;  Surgeon: Winfield Cunas;  Location: [place] [facility];  Service: Neurosurgery;  Laterality: N/A;  Lumbar five-Sacral One Anterior Lumbar Interbody Fusion /Dr. Early to Approach    CERVICAL [place] PLATE IN NECK---LEFT SIDE   CERVICAL SPINE SURGERY  03/24/2019   CESAREAN SECTION     EYE SURGERY Bilateral 2023   cataract   fibroidectomy     FRACTURE SURGERY  05/08/2010   Jones fracture left foot fifth metatarsal   HARDWARE REMOVAL  Right 05/28/2016   Procedure: RIGHT ANKLE HARDWARE REMOVAL;  Surgeon: Elsie Saas, MD;  Location: [place];  Service: Orthopedics;  Laterality: Right;   HARDWARE REMOVAL N/A 07/28/2020   Procedure: Removal of Lumbar Hardware;  Surgeon: Ashok Pall, MD;  Location: [place];  Service: Neurosurgery;  Laterality: N/A;   HERNIA REPAIR     hiatal hernia   IRRIGATION AND DEBRIDEMENT KNEE  04/09/2012   Procedure: IRRIGATION AND DEBRIDEMENT KNEE;  Surgeon: Yvette Rack., MD;  Location: [place];  Service: Orthopedics;  Laterality: Right;   JOINT REPLACEMENT  01/27/2012   left total knee and Right total knee   KNEE SURGERY  2005   Left knee arthroscopy   LAMINECTOMY WITH POSTERIOR LATERAL ARTHRODESIS LEVEL 4 Bilateral 03/22/2022   Procedure: Thoracic ten to Lumbar three Posterior lateral arthrodesis with screws;  Surgeon: Ashok Pall, MD;  Location: [place];  Service: Neurosurgery;  Laterality: Bilateral;   LUMBAR WOUND DEBRIDEMENT N/A 04/08/2022   Procedure: LUMBAR WOUND DEBRIDEMENT;  Surgeon: Ashok Pall, MD;  Location: [place];  Service: Neurosurgery;  Laterality: N/A;   NASAL SEPTOPLASTY W/ TURBINOPLASTY  2007   due to recurrent sinusitis   ORIF ANKLE FRACTURE Right 06/21/2015   Procedure: OPEN REDUCTION INTERNAL FIXATION RIGHT DISTAL FIBULA  FRACTURE AND OPEN REDUCTION INTERNAL FIXATION SYNDESMOSIS ;  Surgeon: Elsie Saas, MD;  Location: [place];  Service: Orthopedics;  Laterality: Right;   PARTIAL HYSTERECTOMY     SHOULDER ARTHROSCOPY W/ ROTATOR CUFF REPAIR Right    SHOULDER SURGERY Left    SPINE SURGERY  2004   Cervical plate, ACDF   STERIOD INJECTION  01/27/2012   Procedure: STEROID INJECTION;  Surgeon: Lorn Junes, MD;  Location: [place];  Service: Orthopedics;  Laterality: Right;   TEE WITHOUT CARDIOVERSION N/A 03/01/2020   Procedure: TRANSESOPHAGEAL ECHOCARDIOGRAM (TEE);  Surgeon: Jerline Pain, MD;  [facility];  Service: Cardiovascular;   Laterality: N/A;   THORACIC DISCECTOMY  02/16/2020   Procedure: Thoracic ten-eleven Discectomy;  Surgeon: Ashok Pall, MD;  Location: [place];  Service: Neurosurgery;;   TONSILLECTOMY  2007   TOTAL KNEE ARTHROPLASTY  01/27/2012   Procedure: TOTAL KNEE ARTHROPLASTY;  Surgeon: Lorn Junes, MD;  Bilateral   TOTAL KNEE ARTHROPLASTY  04/06/2012   Procedure: TOTAL KNEE ARTHROPLASTY;  Surgeon: Lorn Junes, MD;  Location: [place];  Service: Orthopedics;  Laterality: Right;   TUBAL LIGATION     UTERINE FIBROID SURGERY     mid 200s   WOUND EXPLORATION N/A 02/27/2020   Procedure: WOUND EXPLORATION;  Surgeon: Ashok Pall, MD;  Location: [place];  Service: Neurosurgery;  Laterality: N/A;,  (wound vac upper back)   WOUND EXPLORATION N/A 03/30/2022   Procedure: THORACIC WOUND EXPLORATION WITH REPAIR OF CSF LEAK;  Surgeon: Ashok Pall, MD;  Location: [place];  Service: Neurosurgery;  Laterality: N/A;    Family History  Problem Relation Age of Onset   Stroke Father        passed 2004 from pneumonia   Alcohol abuse Father    Pulmonary embolism Mother        after minor knee surgery leading to DVT   Arthritis Sister    Depression Sister    Hypertension Sister    Diabetes Other        grandmother   Anesthesia problems Neg Hx    Hypotension Neg Hx    Malignant hyperthermia Neg Hx    Pseudochol deficiency Neg Hx    Social History:  reports that she quit smoking about 23 years ago. Her smoking use included cigarettes. She has a 5.00 pack-year smoking history. She has never used smokeless tobacco. She reports that she does not drink alcohol and does not use drugs.  Allergies:  Allergies  Allergen Reactions   Monosodium Glutamate Anaphylaxis and Swelling    Eyes swollen shut, facial swelling, tongue swelling.   Shellfish Allergy Anaphylaxis   Baclofen Nausea Only    Dizziness and increase muscle spasm   Celebrex [Celecoxib] Itching    Only allergic to generic brand   Contrast Media  [Iodinated Contrast Media] Itching and Nausea Only    "could not walk"   Diclofenac Itching    Generic Diclefenac gel causes itching. Can take the name brand Voltaren gel   Molds & Smuts Other (See Comments)    Causes allergies to flair up   Other Other (See Comments)    Pet dander,   Green rubber piece to hold mouth open (oval shape)     Sulfa Antibiotics Other (See Comments)    Unknown   Latex Itching and Rash    Latex glove with powder    Medications Prior to Admission  Medication Sig Dispense Refill   acetaminophen (TYLENOL) 500 MG tablet Take 1,000 mg by mouth every 8 (eight) hours as needed for moderate pain.     albuterol (VENTOLIN HFA) 108 (90 Base) MCG/ACT inhaler Inhale 2 puffs into the lungs every 6 (six) hours as needed for wheezing or shortness of breath. (Patient taking differently: Inhale 2 puffs into the lungs every 6 (six) hours as needed (Asthma).) 18 g 0   ascorbic acid (VITAMIN C) 500 MG tablet Take 500 mg by mouth daily.     aspirin EC 81 MG tablet Take 81 mg by mouth daily.     atenolol (TENORMIN) 50 MG tablet Take 1 tablet (50 mg total) by mouth daily. 30 tablet 0   ceFEPime (MAXIPIME) IVPB Inject 2 g into the vein every 8 (eight) hours. Indication:  Enterobacter cloacae lumbar wound infection  First Dose: Yes Last Day of Therapy:  06/02/22  Labs - Once weekly:  CBC/D and BMP, Labs - Every other week:  ESR and CRP Method of administration: IV Push Method of administration may be changed at the discretion of home infusion pharmacist based upon assessment of the patient and/or caregiver's ability to self-administer the medication ordered. 138 Units 0   CELEBREX 200 MG capsule Take 1 capsule (200 mg total) by mouth 2 (two) times daily. (Patient taking differently: Take 200 mg by mouth 2 (two) times daily. Brand only) 60 capsule 0   chlorzoxazone (PARAFON) 500 MG tablet Take 1 tablet (500 mg total) by mouth 3 (three) times daily. 90 tablet 0   cyclobenzaprine  (FLEXERIL)  10 MG tablet Take 10 mg by mouth 3 (three) times daily.     diazepam (VALIUM) 5 MG tablet Take 1 tablet (5 mg total) by mouth 2 (two) times daily. (Patient taking differently: Take 5 mg by mouth See admin instructions. Take 5 mg at bedtime and additional 5 mg if needed for anxiety) 30 tablet 0   diclofenac (FLECTOR) 1.3 % PTCH Place 1 patch onto the skin 2 (two) times daily as needed (pain.).     diclofenac Sodium (VOLTAREN) 1 % GEL Apply 2 g topically 4 (four) times daily as needed (pain).     diphenhydrAMINE (BENADRYL) 25 MG tablet Take 50 mg by mouth in the morning, at noon, in the evening, and at bedtime.     furosemide (LASIX) 20 MG tablet Take 2 tablets (40 mg total) by mouth daily. (Patient taking differently: Take 40 mg by mouth 3 (three) times daily.) 60 tablet 0   gabapentin (NEURONTIN) 600 MG tablet Take 2 tablets (1,200 mg total) by mouth 3 (three) times daily. 180 tablet 0   guaiFENesin (MUCINEX) 600 MG 12 hr tablet Take 600 mg by mouth 2 (two) times daily as needed for cough.     hydrochlorothiazide (HYDRODIURIL) 25 MG tablet Take 1 tablet (25 mg total) by mouth daily. One tablet by mouth in the morning. (Patient taking differently: Take 12.5 mg by mouth daily.) 30 tablet 0   hydrOXYzine (ATARAX/VISTARIL) 50 MG tablet Take 50 mg by mouth 2 (two) times daily.     lubiprostone (AMITIZA) 24 MCG capsule Take 1 capsule (24 mcg total) by mouth 2 (two) times daily with a meal. (Patient taking differently: Take 48 mcg by mouth daily before lunch.) 60 capsule 0   methocarbamol (ROBAXIN) 500 MG tablet Take 1 tablet (500 mg total) by mouth every 6 (six) hours as needed for muscle spasms. (Patient taking differently: Take 500 mg by mouth 5 (five) times daily.) 60 tablet 1   Multiple Vitamin (MULTIVITAMIN WITH MINERALS) TABS tablet Take 1 tablet by mouth daily as needed.     NARCAN 4 MG/0.1ML LIQD nasal spray kit Place 1 spray into the nose once.     omeprazole (PRILOSEC) 20 MG capsule Take  20 mg by mouth in the morning and at bedtime.      oxyCODONE-acetaminophen (PERCOCET) 10-325 MG tablet Take 1 tablet by mouth every 6 (six) hours as needed for pain. (Patient taking differently: Take 1 tablet by mouth 5 (five) times daily.) 60 tablet 0   potassium chloride (KLOR-CON) 10 MEQ tablet Take 1 tablet (10 mEq total) by mouth daily. (Patient taking differently: Take 20 mEq by mouth daily.) 30 tablet 0   Propylene Glycol (SYSTANE COMPLETE) 0.6 % SOLN Place 1 drop into both eyes daily as needed (dry eyes).     senna (SENOKOT) 8.6 MG TABS tablet Take 2 tablets by mouth daily before lunch.     Vitamin D, Ergocalciferol, (DRISDOL) 1.25 MG (50000 UNIT) CAPS capsule Take 50,000 Units by mouth once a week.     zolpidem (AMBIEN) 10 MG tablet Take 10 mg by mouth at bedtime as needed for sleep.     Diethylpropion HCl CR 75 MG TB24 Take 75 mg by mouth daily. (Patient not taking: Reported on 05/10/2022)      Results for orders placed or performed during the hospital encounter of 05/12/22 (from the past 48 hour(s))  HIV Antibody (routine testing w rflx)     Status: None   Collection Time: 05/12/22  6:20  PM  Result Value Ref Range   HIV Screen 4th Generation wRfx Non Reactive Non Reactive    Comment: Performed at [facility], [place] [address], [place], [place] [address]  Basic metabolic panel     Status: Abnormal   Collection Time: 05/12/22  6:20 PM  Result Value Ref Range   Sodium 139 135 - 145 mmol/L   Potassium 4.0 3.5 - 5.1 mmol/L   Chloride 111 98 - 111 mmol/L   CO2 21 (L) 22 - 32 mmol/L   Glucose, Bld 156 (H) 70 - 99 mg/dL    Comment: Glucose reference range applies only to samples taken after fasting for at least 8 hours.   BUN 13 6 - 20 mg/dL   Creatinine, Ser 0.67 0.44 - 1.00 mg/dL   Calcium 9.0 8.9 - 10.3 mg/dL   GFR, Estimated >60 >60 mL/min    Comment: (NOTE) Calculated using the CKD-EPI Creatinine Equation (2021)    Anion gap 7 5 - 15    Comment: Performed at [place] [address] [place], [place] [address]  CBC     Status: Abnormal   Collection Time: 05/12/22  6:20 PM  Result Value Ref Range   WBC 13.0 (H) 4.0 - 10.5 K/uL   RBC 4.16 3.87 - 5.11 MIL/uL   Hemoglobin 8.2 (L) 12.0 - 15.0 g/dL    Comment: Reticulocyte Hemoglobin testing may be clinically indicated, consider ordering this additional test [id number]    HCT 28.7 (L) 36.0 - 46.0 %   MCV 69.0 (L) 80.0 - 100.0 fL   MCH 19.7 (L) 26.0 - 34.0 pg   MCHC 28.6 (L) 30.0 - 36.0 g/dL   RDW 19.1 (H) 11.5 - 15.5 %   Platelets 442 (H) 150 - 400 K/uL    Comment: REPEATED TO VERIFY   nRBC 0.0 0.0 - 0.2 %    Comment: Performed at [facility], [place] [address], [place], [place] [address]   No results found.  Review of Systems  Constitutional: Negative.   HENT: Negative.    Eyes: Negative.   Respiratory: Negative.    Cardiovascular: Negative.   Gastrointestinal: Negative.   Endocrine: Negative.   Genitourinary: Negative.   Musculoskeletal:  Positive for back pain.  Allergic/Immunologic: Negative.   Neurological: Negative.   Hematological: Negative.   Psychiatric/Behavioral: Negative.      Blood pressure 132/68, pulse 93, temperature 98.4 F (36.9 C), temperature source Oral, resp. rate 18, last menstrual period 11/20/2012, SpO2 99 %. Physical Exam Constitutional:      Appearance: She is obese.  HENT:     Head: Normocephalic and atraumatic.     Nose: Nose normal.     Mouth/Throat:     Mouth: Mucous membranes are moist.     Pharynx: Oropharynx is clear.  Cardiovascular:     Rate and Rhythm: Normal rate and regular rhythm.     Pulses: Normal pulses.     Heart sounds: Normal heart sounds.  Pulmonary:     Effort: Pulmonary effort is normal.     Breath sounds: Normal breath sounds.  Abdominal:     Palpations: Abdomen is soft.  Musculoskeletal:        General: Normal range of motion.     Cervical back: Normal range of motion and neck supple.     Lumbar back: Tenderness  present.  Skin:    General: Skin is warm and dry.     Capillary Refill: Capillary refill  takes less than 2 seconds.       Neurological:     General: No focal deficit present.     Mental Status: She is alert and oriented to person, place, and time.      Assessment/Plan Patient admitted for wound care and IV antibiotics prior to scheduled wound closure on Tuesday by Dr. Christella Noa.  Patricia Nettle, NP 05/13/2022, 2:41 PM

## 2022-05-13 NOTE — Progress Notes (Signed)
Patient ID: [name], female   DOB: [birth year], 61 y.o.   MRN: [id number] BP 127/69 (BP Location: Right Arm)   Pulse 90   Temp 98.4 F (36.9 C)   Resp 18   LMP 11/20/2012   SpO2 98%  OR for wound closure tomorrow Moving all extremities well Alert and oriented x 4

## 2022-05-14 ENCOUNTER — Other Ambulatory Visit: Payer: Self-pay

## 2022-05-14 ENCOUNTER — Inpatient Hospital Stay (HOSPITAL_COMMUNITY): Payer: Medicare Other | Admitting: Anesthesiology

## 2022-05-14 ENCOUNTER — Encounter (HOSPITAL_COMMUNITY)
Admission: RE | Disposition: A | Discharge status: Home Health | Payer: Self-pay | Source: Ambulatory Visit | Attending: Neurosurgery

## 2022-05-14 ENCOUNTER — Encounter (HOSPITAL_COMMUNITY): Payer: Self-pay | Admitting: Neurosurgery

## 2022-05-14 DIAGNOSIS — T8140XA Infection following a procedure, unspecified, initial encounter: Secondary | ICD-10-CM

## 2022-05-14 DIAGNOSIS — T8149XA Infection following a procedure, other surgical site, initial encounter: Secondary | ICD-10-CM | POA: Diagnosis present

## 2022-05-14 DIAGNOSIS — J45909 Unspecified asthma, uncomplicated: Secondary | ICD-10-CM

## 2022-05-14 DIAGNOSIS — Z87891 Personal history of nicotine dependence: Secondary | ICD-10-CM

## 2022-05-14 DIAGNOSIS — I10 Essential (primary) hypertension: Secondary | ICD-10-CM

## 2022-05-14 HISTORY — PX: APPLICATION OF WOUND VAC: SHX5189

## 2022-05-14 LAB — CBC
HCT: 34.2 % — ABNORMAL LOW (ref 36.0–46.0)
Hemoglobin: 9.6 g/dL — ABNORMAL LOW (ref 12.0–15.0)
MCH: 19.2 pg — ABNORMAL LOW (ref 26.0–34.0)
MCHC: 28.1 g/dL — ABNORMAL LOW (ref 30.0–36.0)
MCV: 68.4 fL — ABNORMAL LOW (ref 80.0–100.0)
Platelets: 423 10*3/uL — ABNORMAL HIGH (ref 150–400)
RBC: 5 MIL/uL (ref 3.87–5.11)
RDW: 19 % — ABNORMAL HIGH (ref 11.5–15.5)
WBC: 12 10*3/uL — ABNORMAL HIGH (ref 4.0–10.5)
nRBC: 0 % (ref 0.0–0.2)

## 2022-05-14 LAB — CREATININE, SERUM
Creatinine, Ser: 0.59 mg/dL (ref 0.44–1.00)
GFR, Estimated: >60 mL/min (ref >60)

## 2022-05-14 SURGERY — APPLICATION, WOUND VAC
Anesthesia: General | Site: Spine Lumbar

## 2022-05-14 MED ORDER — DEXAMETHASONE SODIUM PHOSPHATE 10 MG/ML IJ SOLN
INTRAMUSCULAR | Status: DC | PRN
  Administered 2022-05-14: 10 mg via INTRAVENOUS

## 2022-05-14 MED ORDER — ONDANSETRON HCL 4 MG/2ML IJ SOLN
INTRAMUSCULAR | Status: DC | PRN
  Administered 2022-05-14: 4 mg via INTRAVENOUS

## 2022-05-14 MED ORDER — CHLORHEXIDINE GLUCONATE CLOTH 2 % EX PADS
6.0000 | MEDICATED_PAD | Freq: Once | CUTANEOUS | Status: AC
  Administered 2022-05-14: 6 via TOPICAL

## 2022-05-14 MED ORDER — CHLORHEXIDINE GLUCONATE 0.12 % MT SOLN: 15.0000 mL | Freq: Once | OROMUCOSAL | Status: AC

## 2022-05-14 MED ORDER — MIDAZOLAM HCL 2 MG/2ML IJ SOLN
INTRAMUSCULAR | Status: AC
  Filled 2022-05-14: qty 2

## 2022-05-14 MED ORDER — CHLORHEXIDINE GLUCONATE 0.12 % MT SOLN
OROMUCOSAL | Status: AC
  Administered 2022-05-14: 15 mL via OROMUCOSAL
  Filled 2022-05-14: qty 15

## 2022-05-14 MED ORDER — OXYCODONE HCL 5 MG PO TABS: 5.0000 mg | ORAL_TABLET | Freq: Once | ORAL | Status: DC | PRN

## 2022-05-14 MED ORDER — LACTATED RINGERS IV SOLN: INTRAVENOUS | Status: DC

## 2022-05-14 MED ORDER — HYDROMORPHONE HCL 1 MG/ML IJ SOLN
0.2500 mg | INTRAMUSCULAR | Status: DC | PRN
  Administered 2022-05-14: 0.5 mg via INTRAVENOUS

## 2022-05-14 MED ORDER — 0.9 % SODIUM CHLORIDE (POUR BTL) OPTIME
TOPICAL | Status: DC | PRN
  Administered 2022-05-14: 1000 mL

## 2022-05-14 MED ORDER — FENTANYL CITRATE (PF) 250 MCG/5ML IJ SOLN
INTRAMUSCULAR | Status: DC | PRN
  Administered 2022-05-14: 50 ug via INTRAVENOUS

## 2022-05-14 MED ORDER — SURGILUBE EX GEL
CUTANEOUS | Status: DC | PRN
  Administered 2022-05-14: 1 via TOPICAL

## 2022-05-14 MED ORDER — LIDOCAINE 2% (20 MG/ML) 5 ML SYRINGE
INTRAMUSCULAR | Status: DC | PRN
  Administered 2022-05-14: 100 mg via INTRAVENOUS

## 2022-05-14 MED ORDER — PROPOFOL 10 MG/ML IV BOLUS
INTRAVENOUS | Status: AC
  Filled 2022-05-14: qty 20

## 2022-05-14 MED ORDER — ORAL CARE MOUTH RINSE: 15.0000 mL | Freq: Once | OROMUCOSAL | Status: AC

## 2022-05-14 MED ORDER — PROPOFOL 10 MG/ML IV BOLUS
INTRAVENOUS | Status: DC | PRN
  Administered 2022-05-14: 120 mg via INTRAVENOUS

## 2022-05-14 MED ORDER — OXYCODONE HCL 5 MG/5ML PO SOLN: 5.0000 mg | Freq: Once | ORAL | Status: DC | PRN

## 2022-05-14 MED ORDER — VANCOMYCIN HCL 1000 MG IV SOLR
INTRAVENOUS | Status: AC
  Filled 2022-05-14: qty 20

## 2022-05-14 MED ORDER — VANCOMYCIN HCL 1000 MG IV SOLR
INTRAVENOUS | Status: DC | PRN
  Administered 2022-05-14: 1000 mg

## 2022-05-14 MED ORDER — CHLORHEXIDINE GLUCONATE 0.12 % MT SOLN
OROMUCOSAL | Status: AC
  Filled 2022-05-14: qty 15

## 2022-05-14 MED ORDER — FENTANYL CITRATE (PF) 250 MCG/5ML IJ SOLN
INTRAMUSCULAR | Status: AC
  Filled 2022-05-14: qty 5

## 2022-05-14 MED ORDER — ROCURONIUM BROMIDE 10 MG/ML (PF) SYRINGE
PREFILLED_SYRINGE | INTRAVENOUS | Status: DC | PRN
  Administered 2022-05-14: 70 mg via INTRAVENOUS

## 2022-05-14 MED ORDER — SUGAMMADEX SODIUM 200 MG/2ML IV SOLN
INTRAVENOUS | Status: DC | PRN
  Administered 2022-05-14: 181.4 mg via INTRAVENOUS

## 2022-05-14 MED ORDER — CEFAZOLIN SODIUM-DEXTROSE 2-4 GM/100ML-% IV SOLN
2.0000 g | INTRAVENOUS | Status: AC
  Administered 2022-05-14: 2 g via INTRAVENOUS
  Filled 2022-05-14 (×2): qty 100

## 2022-05-14 MED ORDER — ONDANSETRON HCL 4 MG/2ML IJ SOLN: 4.0000 mg | Freq: Once | INTRAMUSCULAR | Status: DC | PRN

## 2022-05-14 MED ORDER — ENOXAPARIN SODIUM 30 MG/0.3ML IJ SOSY
30.0000 mg | PREFILLED_SYRINGE | INTRAMUSCULAR | Status: DC
Start: 2022-05-14 — End: 2022-05-17
  Administered 2022-05-14 – 2022-05-16 (×3): 30 mg via SUBCUTANEOUS
  Filled 2022-05-14 (×3): qty 0.3

## 2022-05-14 MED ORDER — HYDROMORPHONE HCL 1 MG/ML IJ SOLN
INTRAMUSCULAR | Status: AC
  Filled 2022-05-14: qty 1

## 2022-05-14 SURGICAL SUPPLY — 46 items
APL SKNCLS STERI-STRIP NONHPOA (GAUZE/BANDAGES/DRESSINGS)
BAG COUNTER SPONGE SURGICOUNT (BAG) ×2 IMPLANT
BAG SPNG CNTER NS LX DISP (BAG)
BENZOIN TINCTURE PRP APPL 2/3 (GAUZE/BANDAGES/DRESSINGS) ×2 IMPLANT
BLADE CLIPPER SURG (BLADE) IMPLANT
BNDG GAUZE DERMACEA FLUFF (GAUZE/BANDAGES/DRESSINGS) ×2
BNDG GAUZE DERMACEA FLUFF 4 (GAUZE/BANDAGES/DRESSINGS) IMPLANT
BNDG GZE DERMACEA 4 6PLY (GAUZE/BANDAGES/DRESSINGS) ×1
CANISTER SUCT 3000ML PPV (MISCELLANEOUS) ×3 IMPLANT
CARTRIDGE OIL MAESTRO DRILL (MISCELLANEOUS) ×2 IMPLANT
CUITMED SORBACT ×1 IMPLANT
DIFFUSER DRILL AIR PNEUMATIC (MISCELLANEOUS) ×2 IMPLANT
DRAPE LAPAROTOMY 100X72 PEDS (DRAPES) IMPLANT
DRAPE LAPAROTOMY 100X72X124 (DRAPES) IMPLANT
DRAPE SURG 17X23 STRL (DRAPES) ×8 IMPLANT
ELECT REM PT RETURN 9FT ADLT (ELECTROSURGICAL) ×2
ELECTRODE REM PT RTRN 9FT ADLT (ELECTROSURGICAL) ×2 IMPLANT
GAUZE 4X4 16PLY ~~LOC~~+RFID DBL (SPONGE) IMPLANT
GAUZE SPONGE 4X4 12PLY STRL (GAUZE/BANDAGES/DRESSINGS) ×3 IMPLANT
GLOVE ECLIPSE 6.5 STRL STRAW (GLOVE) ×3 IMPLANT
GLOVE EXAM NITRILE XL STR (GLOVE) IMPLANT
GOWN STRL REUS W/ TWL LRG LVL3 (GOWN DISPOSABLE) IMPLANT
GOWN STRL REUS W/ TWL XL LVL3 (GOWN DISPOSABLE) IMPLANT
GOWN STRL REUS W/TWL LRG LVL3 (GOWN DISPOSABLE)
GOWN STRL REUS W/TWL XL LVL3 (GOWN DISPOSABLE)
KIT BASIN OR (CUSTOM PROCEDURE TRAY) ×3 IMPLANT
KIT TURNOVER KIT B (KITS) ×3 IMPLANT
NEEDLE HYPO 22GX1.5 SAFETY (NEEDLE) IMPLANT
NS IRRIG 1000ML POUR BTL (IV SOLUTION) ×3 IMPLANT
OIL CARTRIDGE MAESTRO DRILL (MISCELLANEOUS)
PACK LAMINECTOMY NEURO (CUSTOM PROCEDURE TRAY) ×3 IMPLANT
PAD ARMBOARD 7.5X6 YLW CONV (MISCELLANEOUS) ×8 IMPLANT
PENCIL BUTTON HOLSTER BLD 10FT (ELECTRODE) ×1 IMPLANT
POWDER MYRIAD MORCELLS 1000MG (Miscellaneous) ×1 IMPLANT
STRIP CLOSURE SKIN 1/2X4 (GAUZE/BANDAGES/DRESSINGS) ×2 IMPLANT
SUT VIC AB 0 CT1 18XCR BRD 8 (SUTURE) IMPLANT
SUT VIC AB 0 CT1 8-18 (SUTURE) ×2
SUT VIC AB 1 CT1 18XBRD ANBCTR (SUTURE) ×2 IMPLANT
SUT VIC AB 1 CT1 18XCR BRD 8 (SUTURE) IMPLANT
SUT VIC AB 1 CT1 8-18 (SUTURE) ×2
SUT VIC AB 2-0 CP2 18 (SUTURE) ×2 IMPLANT
SWAB COLLECTION DEVICE MRSA (MISCELLANEOUS) IMPLANT
SWAB CULTURE ESWAB REG 1ML (MISCELLANEOUS) IMPLANT
TOWEL GREEN STERILE (TOWEL DISPOSABLE) ×2 IMPLANT
TOWEL GREEN STERILE FF (TOWEL DISPOSABLE) ×3 IMPLANT
WATER STERILE IRR 1000ML POUR (IV SOLUTION) ×2 IMPLANT

## 2022-05-14 NOTE — Anesthesia Preprocedure Evaluation (Addendum)
Anesthesia Evaluation  Patient identified by MRN, date of birth, ID band Patient awake    Reviewed: Allergy & Precautions, NPO status , Patient's Chart, lab work & pertinent test results  Airway Mallampati: I  TM Distance: >3 FB Neck ROM: Full    Dental no notable dental hx. (+) Edentulous Upper, Edentulous Lower   Pulmonary asthma , former smoker,    Pulmonary exam normal breath sounds clear to auscultation       Cardiovascular hypertension, Pt. on medications and Pt. on home beta blockers Normal cardiovascular exam Rhythm:Regular Rate:Normal     Neuro/Psych Chronic pain patient negative psych ROS   GI/Hepatic Neg liver ROS, GERD  ,  Endo/Other  Hyperthyroidism   Renal/GU negative Renal ROS  negative genitourinary   Musculoskeletal negative musculoskeletal ROS (+)   Abdominal   Peds negative pediatric ROS (+)  Hematology  (+) Blood dyscrasia, anemia ,   Anesthesia Other Findings   Reproductive/Obstetrics negative OB ROS                            Anesthesia Physical Anesthesia Plan  ASA: 3  Anesthesia Plan: General   Post-op Pain Management: Ketamine IV*   Induction: Intravenous  PONV Risk Score and Plan: 3 and Ondansetron, Dexamethasone and Treatment may vary due to age or medical condition  Airway Management Planned: Oral ETT  Additional Equipment:   Intra-op Plan:   Post-operative Plan: Extubation in OR  Informed Consent: I have reviewed the patients History and Physical, chart, labs and discussed the procedure including the risks, benefits and alternatives for the proposed anesthesia with the patient or authorized representative who has indicated his/her understanding and acceptance.     Dental advisory given  Plan Discussed with: CRNA and Surgeon  Anesthesia Plan Comments: (50 mg ketamine iv x 1 during case   Ms. Georg underwent a T10-L3 posterolateral  arthrodesis with segmental instrumentation by Dr. Franky Macho on 03/22/2022. She returned to the OR on 03/30/2022 for repair of a CSF leak. Her surgical wound developed purulent drainage and she returned to the OR on 04/08/2022 for wound debridement and placement of a wound vac. Her wound culture grew enterobacter cloacae and the patient was started on cefepime. She was discharged home with home health services on 04/30/2022. She returned to the emergency department on 05/02/2022 as her PICC line got pulled out and her wound VAC dressing had come off. These were replaced in the ED and the patient was discharged home. On 05/11/2022 at 5:35 pm, the patient contacted the Neurosurgery call provider to report that the wound VAC had become disconnected from her back once again. The patient wanted to avoid coming to the emergency department if at all possible. The [facility] on-call provider reached out to the [facility] triage nurse and also spoke with the on-call home health nurse. Arrangements were made for the home health nurse to come out, at the latest, on 05/12/2022 first thing in the morning. The patient contacted the [facility] provider again at 11:30 am to report that the [facility] nurse had not come by and her wound VAC was still disconnected. At this point, the patient's wound had not had an appropriate dressing on for almost 24 hours. The patient came in for a direct admit to evaluate her surgical wound and continue her IV antibiotics prior to her scheduled wound closure on Tuesday.)       Anesthesia Quick Evaluation

## 2022-05-14 NOTE — Transfer of Care (Signed)
Immediate Anesthesia Transfer of Care Note  Patient: [name]  Procedure(s) Performed: Removal of wound vac with closure of wound (Spine Lumbar)  Patient Location: PACU  Anesthesia Type:General  Level of Consciousness: awake, alert  and oriented  Airway & Oxygen Therapy: Patient Spontanous Breathing  Post-op Assessment: Report given to RN and Post -op Vital signs reviewed and stable  Post vital signs: Reviewed and stable  Last Vitals:  Vitals Value Taken Time  BP 104/75   Temp 36   Pulse 80 05/14/22 1705  Resp 15 05/14/22 1705  SpO2 97 % 05/14/22 1705  Vitals shown include unvalidated device data.  Last Pain:  Vitals:   05/14/22 1437  TempSrc:   PainSc: 10-Worst pain ever      Patients Stated Pain Goal: 5 (05/14/22 1437)  Complications: No notable events documented.

## 2022-05-14 NOTE — Plan of Care (Signed)

## 2022-05-14 NOTE — Op Note (Signed)
05/14/2022  5:35 PM  PATIENT:  [name]  61 y.o. female  PRE-OPERATIVE DIAGNOSIS:  Post op wound infection  POST-OPERATIVE DIAGNOSIS:  Post op wound infection  PROCEDURE:  Procedure(s): Removal of wound vac with closure of wound  SURGEON: Surgeon(s): Ashok Pall, MD  ASSISTANTS:none  ANESTHESIA:   general  EBL:  Total I/O In: 600 [I.V.:600] Out: 780 [Urine:780]  BLOOD ADMINISTERED:none  CELL SAVER GIVEN:none  COUNT:per nursing  DRAINS: none   SPECIMEN:  No Specimen  DICTATION: [name] was taken to the operating room, intubated, and placed under a general anesthetic without difficulty. She was positioned prone with all pressure points padded. Her back was prepped and draped in a sterile manner.  I irrigated the wound, and the wound bed was pink, excellent blood supply, with no signs of infection.  I placed 159m myriad morcells on the wound surface. I then placed vicryl sutures in the fascial layer and the subcutaneous layer. There was significant tension on the wound edges there fore I did not try to close it primarily. I placed vancomycin powder 1 gm on the wound surface. I then dressed the wound with a sorbact sheet, then a wet to dry dressing with kerlix and gauze. Tape applied to the dressing. She was turned supine and extubated with no difficulty.  PLAN OF CARE: Admit to inpatient   PATIENT DISPOSITION:  PACU - hemodynamically stable.   Delay start of Pharmacological VTE agent (>24hrs) due to surgical blood loss or risk of bleeding:  no

## 2022-05-14 NOTE — Progress Notes (Signed)
BP 127/84   Pulse 81   Temp 97.9 F (36.6 C) (Oral)   Resp 20   Ht 5\' 3"  (1.6 m)   Wt 90.7 kg   LMP 11/20/2012   SpO2 95%   BMI 35.43 kg/m  Alert and oriented x 4, speech is clear and fluent. Moving all extremities Ok for wound revision.

## 2022-05-14 NOTE — Anesthesia Procedure Notes (Signed)
Procedure Name: Intubation Date/Time: 05/14/2022 4:18 PM  Performed by: Rosiland Oz, CRNAPre-anesthesia Checklist: Patient identified, Emergency Drugs available, Suction available, Patient being monitored and Timeout performed Patient Re-evaluated:Patient Re-evaluated prior to induction Oxygen Delivery Method: Circle system utilized Preoxygenation: Pre-oxygenation with 100% oxygen Induction Type: IV induction Ventilation: Mask ventilation without difficulty Laryngoscope Size: Miller and 3 Grade View: Grade I Tube type: Oral Tube size: 7.0 mm Number of attempts: 1 Airway Equipment and Method: Stylet Placement Confirmation: ETT inserted through vocal cords under direct vision, positive ETCO2 and breath sounds checked- equal and bilateral Secured at: 21 cm Tube secured with: Tape Dental Injury: Teeth and Oropharynx as per pre-operative assessment

## 2022-05-15 ENCOUNTER — Encounter (HOSPITAL_COMMUNITY): Payer: Self-pay | Admitting: Neurosurgery

## 2022-05-15 NOTE — Evaluation (Signed)
Occupational Therapy Evaluation and Discharge Patient Details Name: [name] MRN: [id number] DOB: 28-Oct-1960 Today's Date: 05/15/2022   History of Present Illness 61 y.o. female admitted 8/6 and underwent removal of wound vac with closure of wound 8/8. Hx: s/p T10-L3 PLIF 6/ 16. 6/24 wound exploration with repair of CSF leak. S/p lumbar I&D 04/08/22.  PMH: anemia, anxiety, fibromyalgia, GERD, HTN, hyperthyroidism, IBS, ACDF 3/4   Clinical Impression   Pt had been functioning modified independently in ADLs and is well equipped with DME and AE at home. She presents with back pain and requires supervision for mobility with RW for safety and IV line. Reviewed back precautions, compensatory strategies for ADLs and IADLs to avoid with pt verbalizing understanding. No further OT needs. Pt has many friends and neighbors to assist her as needed at home.      Recommendations for follow up therapy are one component of a multi-disciplinary discharge planning process, led by the attending physician.  Recommendations may be updated based on patient status, additional functional criteria and insurance authorization.   Follow Up Recommendations  No OT follow up    Assistance Recommended at Discharge Intermittent Supervision/Assistance  Patient can return home with the following Assist for transportation;Help with stairs or ramp for entrance    Functional Status Assessment  Patient has had a recent decline in their functional status and demonstrates the ability to make significant improvements in function in a reasonable and predictable amount of time.  Equipment Recommendations  None recommended by OT    Recommendations for Other Services       Precautions / Restrictions Precautions Precautions: Back;Fall Precaution Booklet Issued:  (provided previously) Precaution Comments: pt knowledgeable in back precautions Required Braces or Orthoses:  (no brace needed from prior  admission) Restrictions Weight Bearing Restrictions: No Other Position/Activity Restrictions: no brace needed      Mobility Bed Mobility Overal bed mobility: Modified Independent             General bed mobility comments: aware of log roll technique    Transfers Overall transfer level: Needs assistance Equipment used: Rolling walker (2 wheels) Transfers: Sit to/from Stand Sit to Stand: Supervision                  Balance Overall balance assessment: Needs assistance   Sitting balance-[name]: Good       Standing balance-[name]: Fair Standing balance comment: Stands without UE support. Minimal instability noted, but great control with UE support on RW.                           ADL either performed or assessed with clinical judgement   ADL Overall ADL's : Needs assistance/impaired Eating/Feeding: Independent;Sitting   Grooming: Supervision/safety;Standing Grooming Details (indicate cue type and reason): reviewed two cup method for oral care and use of washcloth on face Upper Body Bathing: Modified independent;Sitting Upper Body Bathing Details (indicate cue type and reason): reviewed use of long handled bath sponge, avoiding twisting to wash back Lower Body Bathing: Modified independent;Sit to/from stand Lower Body Bathing Details (indicate cue type and reason): reviewed use of long handled bath sponge and reacher to wash and dry feet Upper Body Dressing : Set up;Sitting   Lower Body Dressing: Set up;Bed level Lower Body Dressing Details (indicate cue type and reason): pt typically dresses in supine with figure 4 method Toilet Transfer: Supervision/safety;Ambulation;Rolling walker (2 wheels)   Toileting- Clothing Manipulation and Hygiene: Supervision/safety Toileting -  Clothing Manipulation Details (indicate cue type and reason): reviewed avoiding twisting with pericare     Functional mobility during ADLs: Supervision/safety;Rolling  walker (2 wheels) General ADL Comments: reviewed IADLs to avoid     Vision Baseline Vision/History: 1 Wears glasses Ability to See in Adequate Light: 0 Adequate Patient Visual Report: No change from baseline       Perception     Praxis      Pertinent Vitals/Pain Pain Assessment Pain Assessment: Faces Faces Pain Scale: Hurts whole lot Pain Location: back/ generalized Pain Descriptors / Indicators: Discomfort, Guarding, Grimacing Pain Intervention(s): Premedicated before session, Repositioned     Hand Dominance Right   Extremity/Trunk Assessment Upper Extremity Assessment Upper Extremity Assessment: Overall WFL for tasks assessed RUE Sensation: history of peripheral neuropathy LUE Sensation: history of peripheral neuropathy   Lower Extremity Assessment Lower Extremity Assessment: Defer to PT evaluation   Cervical / Trunk Assessment Cervical / Trunk Assessment: Back Surgery;Other exceptions (obesity)   Communication Communication Communication: No difficulties   Cognition Arousal/Alertness: Awake/alert Behavior During Therapy: WFL for tasks assessed/performed Overall Cognitive Status: Within Functional Limits for tasks assessed                                       General Comments  RN to room towards end of session, provided pain medication    Exercises     Shoulder Instructions      Home Living Family/patient expects to be discharged to:: Private residence Living Arrangements: Alone Available Help at Discharge: Family;Friend(s);Available PRN/intermittently Type of Home: House Home Access: Stairs to enter [employer] of Steps: 5 Entrance Stairs-Rails: Right;Left Home Layout: One level     Bathroom Shower/Tub: Chief Strategy Officer: Standard Bathroom Accessibility: Yes How Accessible: Accessible via walker Home Equipment: Grab bars - tub/shower;Shower seat;Toilet riser;Hand held Programmer, systems (2  wheels);Rollator (4 wheels);Cane - single point;Adaptive equipment Adaptive Equipment: Reacher;Long-handled sponge Additional Comments: States she has as much help as needed.      Prior Functioning/Environment Prior Level of Function : Independent/Modified Independent             Mobility Comments: Rollator for mobility ADLs Comments: generally mod I with DME/AE and typically does LB dressing in supine        OT Problem List:        OT Treatment/Interventions:      OT Goals(Current goals can be found in the care plan section)    OT Frequency:      Co-evaluation              AM-PAC OT "6 Clicks" Daily Activity     Outcome Measure Help from another person eating meals?: None Help from another person taking care of personal grooming?: A Little Help from another person toileting, which includes using toliet, bedpan, or urinal?: A Little Help from another person bathing (including washing, rinsing, drying)?: A Little Help from another person to put on and taking off regular upper body clothing?: None Help from another person to put on and taking off regular lower body clothing?: A Little 6 Click Score: 20   End of Session Equipment Utilized During Treatment: Rolling walker (2 wheels)  Activity Tolerance: Patient tolerated treatment well Patient left: in bed;with call bell/phone within reach  OT Visit Diagnosis: Unsteadiness on feet (R26.81);Other abnormalities of gait and mobility (R26.89);Pain  Time: [id number] OT Time Calculation (min): 16 min Charges:  OT General Charges $OT Visit: 1 Visit OT Evaluation $OT Eval Low Complexity: [place], OTR/L Acute Rehabilitation Services Office: [phone]   Malka So 05/15/2022, 11:30 AM

## 2022-05-15 NOTE — Plan of Care (Signed)

## 2022-05-15 NOTE — Progress Notes (Signed)
Physical Therapy Evaluation Patient Details Name: [name] MRN: [id number] DOB: 02/10/1961 Today's Date: 05/15/2022  History of Present Illness  61 y.o. female admitted 8/6 and underwent Removal of wound vac with closure of wound 8/8. Hx: s/p T10-L3 PLIF 6/ 16. 6/24 wound exploration with repair of CSF leak. S/p lumbar I&D 04/08/22.  PMH: anemia, anxiety, fibromyalgia, GERD, HTN, hyperthyroidism, IBS, ACDF 3/4  Clinical Impression  Patient is s/p above surgery resulting in functional limitations due to the deficits listed below (see PT Problem List). Mobilizing at a supervision to Mod I level. Mod I at home prior to admission. Pt feels she is abe to transfer and ambulate near baseline - encouraged using RW for a while at home due to potential fluctuation of symptoms and for increased safety. Pt eager to go home today and states she will have any additional assistance as needed but has been managing well on her own. Will follow until d/c. Patient will benefit from skilled PT to increase their independence and safety with mobility to allow discharge to the venue listed below.          Recommendations for follow up therapy are one component of a multi-disciplinary discharge planning process, led by the attending physician.  Recommendations may be updated based on patient status, additional functional criteria and insurance authorization.  Follow Up Recommendations Home health PT Can patient physically be transported by private vehicle: Yes    Assistance Recommended at Discharge PRN  Patient can return home with the following  A little help with bathing/dressing/bathroom;Help with stairs or ramp for entrance;Assist for transportation;Assistance with cooking/housework    Equipment Recommendations None recommended by PT  Recommendations for Other Services       Functional Status Assessment Patient has had a recent decline in their functional status and demonstrates the ability to make  significant improvements in function in a reasonable and predictable amount of time.     Precautions / Restrictions Precautions Precautions: Back;Fall Precaution Booklet Issued:  (provided previously) Required Braces or Orthoses:  (no brace needed from prior admission) Restrictions Weight Bearing Restrictions: No (from prior admission) Other Position/Activity Restrictions: no brace needed      Mobility  Bed Mobility Overal bed mobility: Needs Assistance Bed Mobility: Rolling, Sidelying to Sit Rolling: Modified independent (Device/Increase time), Supervision Sidelying to sit: Supervision       General bed mobility comments: Supervision for safety and to maintain spinal precautions, slow but capable without physical assistance.    Transfers Overall transfer level: Needs assistance Equipment used: Rolling walker (2 wheels) Transfers: Sit to/from Stand Sit to Stand: Supervision   Step pivot transfers: Supervision       General transfer comment: Supervision for safety, good hand placement, maintains precautions. No assist needed, performed from bed x2    Ambulation/Gait Ambulation/Gait assistance: Supervision Gait Distance (Feet): 125 Feet Assistive device: Rolling walker (2 wheels) Gait Pattern/deviations: Step-through pattern, Trunk flexed, Decreased stride length Gait velocity: decreased Gait velocity interpretation: <1.8 ft/sec, indicate of risk for recurrent falls   General Gait Details: Slow and cautious, good RW control, Supervision for safety. No LOB or buckling noted. UE support on AD helpful for additional support and comfort.  Stairs         General stair comments: Declines to practice, states she completed fine on previous admission  Wheelchair Mobility    Modified Rankin (Stroke Patients Only)       Balance Overall balance assessment: Needs assistance Sitting-balance support: Feet supported, No upper extremity supported  Sitting balance-Leahy  Scale: Good Sitting balance - Comments: static sitting EOB   Standing balance support: During functional activity, No upper extremity supported Standing balance-[name]: Fair Standing balance comment: Stands without UE support. Minimal instability noted, but great control with UE support on RW.                             Pertinent Vitals/Pain Pain Assessment Pain Assessment: 0-10 Pain Score: 10-Worst pain ever Pain Location: back/ generalized (reports fibromyalgia) Pain Descriptors / Indicators: Discomfort, Guarding, Grimacing Pain Intervention(s): Limited activity within patient's tolerance, Monitored during session, Repositioned, Patient requesting pain meds-RN notified, RN gave pain meds during session    Home Living Family/patient expects to be discharged to:: Private residence Living Arrangements: Alone Available Help at Discharge: Family;Friend(s);Available PRN/intermittently Type of Home: House Home Access: Stairs to enter Entrance Stairs-Rails: Doctor, general practice of Steps: 5   Home Layout: One level Home Equipment: Grab bars - tub/shower;Shower seat;Toilet riser;Hand held Programmer, systems (2 wheels);Rollator (4 wheels);Cane - single point Additional Comments: States she has as much help as needed.    Prior Function Prior Level of Function : Independent/Modified Independent             Mobility Comments: Rollator for mobility ADLs Comments: generally mod I with DME     Hand Dominance   Dominant Hand: Right    Extremity/Trunk Assessment        Lower Extremity Assessment Lower Extremity Assessment: Generalized weakness       Communication   Communication: No difficulties  Cognition Arousal/Alertness: Awake/alert Behavior During Therapy: WFL for tasks assessed/performed Overall Cognitive Status: Within Functional Limits for tasks assessed                                          General  Comments General comments (skin integrity, edema, etc.): RN to room towards end of session, provided pain medication    Exercises     Assessment/Plan    PT Assessment Patient needs continued PT services  PT Problem List Decreased strength;Decreased mobility;Decreased activity tolerance;Decreased balance;Pain;Decreased range of motion       PT Treatment Interventions DME instruction;Therapeutic activities;Patient/family education;Therapeutic exercise;Gait training;Balance training;Functional mobility training;Stair training    PT Goals (Current goals can be found in the Care Plan section)  Acute Rehab PT Goals Patient Stated Goal: to go home today PT Goal Formulation: With patient Time For Goal Achievement: 05/19/22 Potential to Achieve Goals: Good    Frequency Min 3X/week     Co-evaluation               AM-PAC PT "6 Clicks" Mobility  Outcome Measure Help needed turning from your back to your side while in a flat bed without using bedrails?: None Help needed moving from lying on your back to sitting on the side of a flat bed without using bedrails?: None Help needed moving to and from a bed to a chair (including a wheelchair)?: A Little Help needed standing up from a chair using your arms (e.g., wheelchair or bedside chair)?: A Little Help needed to walk in hospital room?: A Little Help needed climbing 3-5 steps with a railing? : A Little 6 Click Score: 20    End of Session   Activity Tolerance: Patient tolerated treatment well Patient left: with call bell/phone within reach;in bed;with nursing/sitter in  room (in room inspecting incision) Nurse Communication: Mobility status PT Visit Diagnosis: Other abnormalities of gait and mobility (R26.89);Difficulty in walking, not elsewhere classified (R26.2);Pain Pain - part of body:  (back)    Time: 0626-9485 PT Time Calculation (min) (ACUTE ONLY): 19 min   Charges:   PT Evaluation $PT Eval Low Complexity: 1 Low           Kathlyn Sacramento, PT   Berton Mount 05/15/2022, 10:02 AM

## 2022-05-15 NOTE — Progress Notes (Signed)
Patient ID: [name], female   DOB: [birth year], 61 y.o.   MRN: [id number] BP 137/76 (BP Location: Left Arm)   Pulse 95   Temp 98.3 F (36.8 C) (Oral)   Resp 20   Ht 5\' 3"  (1.6 m)   Wt 90.7 kg   LMP 11/20/2012   SpO2 98%   BMI 35.43 kg/m  Alert and oriented x 4, speech is clear and fluent Will change dressing tomorrow, and discharge Doing well

## 2022-05-15 NOTE — Care Management Important Message (Signed)
Important Message  Patient Details  Name: [name] MRN: [id number] Date of Birth: Aug 26, 1961   Medicare Important Message Given:  Yes     Tonnia Bardin Stefan Church 05/15/2022, 12:31 PM

## 2022-05-15 NOTE — Anesthesia Postprocedure Evaluation (Signed)
Anesthesia Post Note  Patient: [name]  Procedure(s) Performed: Removal of wound vac with closure of wound (Spine Lumbar)     Patient location during evaluation: PACU Anesthesia Type: General Level of consciousness: awake and alert Pain management: pain level controlled Vital Signs Assessment: post-procedure vital signs reviewed and stable Respiratory status: spontaneous breathing, nonlabored ventilation, respiratory function stable and patient connected to nasal cannula oxygen Cardiovascular status: blood pressure returned to baseline and stable Postop Assessment: no apparent nausea or vomiting Anesthetic complications: no   No notable events documented.  Last Vitals:  Vitals:   05/15/22 0343 05/15/22 1100  BP: 137/80 138/87  Pulse: 83 87  Resp: 18 18  Temp:  36.8 C  SpO2: 91% 98%    Last Pain:  Vitals:   05/15/22 1347  TempSrc:   PainSc: Asleep                 Coleby Yett S

## 2022-05-16 MED ORDER — CEFEPIME IV (FOR PTA / DISCHARGE USE ONLY): 2.0000 g | Freq: Three times a day (TID) | INTRAVENOUS | 0 refills | Status: AC

## 2022-05-16 NOTE — Progress Notes (Signed)
OPAT Order Details             Outpatient Parenteral Antibiotic Therapy Consult  Until discontinued       Provider:  (Not yet assigned)  Question Answer Comment  Antibiotic Cefepime (Maxipime) IVPB   Indications for use enterobacter wound infection   End Date 06/02/2022                 [name], PharmD, [facility] PGY1 Pharmacy Resident 05/16/2022 5:28 PM

## 2022-05-16 NOTE — Progress Notes (Signed)
Physical Therapy Treatment Patient Details Name: [name] MRN: [id number] DOB: [birth year] Today's Date: 05/16/2022   History of Present Illness 61 y.o. female admitted 8/6 and underwent Removal of wound vac with closure of wound 8/8. Hx: s/p T10-L3 PLIF 6/ 16. 6/24 wound exploration with repair of CSF leak. S/p lumbar I&D 04/08/22.  PMH: anemia, anxiety, fibromyalgia, GERD, HTN, hyperthyroidism, IBS, ACDF 3/4    PT Comments    Ambulating >300 feet with light/minimal use of RW at a supervision level without evidence of loss of balance. Short distances in room without AD. Declines to practice stairs and feels confident that she can manage at home once discharged. Pt has no further questions or concerns regarding mobility. Adequate for d/c from a mobility stand point once cleared by medical team. Patient will continue to benefit from skilled physical therapy services to further improve independence with functional mobility.    Recommendations for follow up therapy are one component of a multi-disciplinary discharge planning process, led by the attending physician.  Recommendations may be updated based on patient status, additional functional criteria and insurance authorization.  Follow Up Recommendations  Home health PT Can patient physically be transported by private vehicle: Yes   Assistance Recommended at Discharge PRN  Patient can return home with the following Help with stairs or ramp for entrance;Assist for transportation;Assistance with cooking/housework   Equipment Recommendations  None recommended by PT    Recommendations for Other Services       Precautions / Restrictions Precautions Precautions: Back;Fall Precaution Booklet Issued:  (provided previously) Required Braces or Orthoses:  (no brace needed from prior admission) Restrictions Weight Bearing Restrictions: No (from prior admission) Other Position/Activity Restrictions: no brace needed     Mobility  Bed  Mobility Overal bed mobility: Modified Independent             General bed mobility comments: Performs with extra time, no physical assist.    Transfers Overall transfer level: Needs assistance Equipment used: Rolling walker (2 wheels) Transfers: Sit to/from Stand Sit to Stand: Supervision           General transfer comment: Supervision initially from bed, progressed to mod I from toilet.    Ambulation/Gait Ambulation/Gait assistance: Supervision Gait Distance (Feet): 325 Feet Assistive device: Rolling walker (2 wheels) Gait Pattern/deviations: Step-through pattern, Trunk flexed, Decreased stride length Gait velocity: decreased Gait velocity interpretation: <1.8 ft/sec, indicate of risk for recurrent falls   General Gait Details: Slower gait speed but steady with RW - short distance in room with no device, assistance provided for line management. No evidence of LOB during bout.   Stairs         General stair comments: Declines to practice, states she will be fine at home.   Wheelchair Mobility    Modified Rankin (Stroke Patients Only)       Balance Overall balance assessment: Needs assistance Sitting-balance support: Feet supported, No upper extremity supported Sitting balance-[name]: Good Sitting balance - Comments: static sitting EOB   Standing balance support: During functional activity, No upper extremity supported Standing balance-[name]: Fair Standing balance comment: Stands without UE support. Minimal instability noted, but great control with UE support on RW.                            Cognition Arousal/Alertness: Awake/alert Behavior During Therapy: WFL for tasks assessed/performed Overall Cognitive Status: Within Functional Limits for tasks assessed  Exercises      General Comments        Pertinent Vitals/Pain Pain Assessment Pain Assessment:  Faces Faces Pain Scale: Hurts little more Pain Location: back/ generalized (reports fibromyalgia) Pain Descriptors / Indicators: Discomfort, Guarding Pain Intervention(s): Limited activity within patient's tolerance, Monitored during session, Repositioned    Home Living                          Prior Function            PT Goals (current goals can now be found in the care plan section) Acute Rehab PT Goals Patient Stated Goal: to go home today PT Goal Formulation: With patient Time For Goal Achievement: 05/19/22 Potential to Achieve Goals: Good Progress towards PT goals: Progressing toward goals    Frequency    Min 3X/week      PT Plan Current plan remains appropriate    Co-evaluation              AM-PAC PT "6 Clicks" Mobility   Outcome Measure  Help needed turning from your back to your side while in a flat bed without using bedrails?: None Help needed moving from lying on your back to sitting on the side of a flat bed without using bedrails?: None Help needed moving to and from a bed to a chair (including a wheelchair)?: None Help needed standing up from a chair using your arms (e.g., wheelchair or bedside chair)?: None Help needed to walk in hospital room?: A Little Help needed climbing 3-5 steps with a railing? : A Little 6 Click Score: 22    End of Session   Activity Tolerance: Patient tolerated treatment well Patient left: with call bell/phone within reach;in bed Nurse Communication:  (Pt ready for dressing change) PT Visit Diagnosis: Other abnormalities of gait and mobility (R26.89);Difficulty in walking, not elsewhere classified (R26.2);Pain Pain - part of body:  (back)     Time: 6314-9702 PT Time Calculation (min) (ACUTE ONLY): 23 min  Charges:  $Gait Training: 8-22 mins $Therapeutic Activity: 8-22 mins                     Kathlyn Sacramento, PT    Berton Mount 05/16/2022, 11:29 AM

## 2022-05-16 NOTE — Progress Notes (Signed)
Patient ID: [name], female   DOB: 1961-05-21, 61 y.o.   MRN: [id number] BP 125/72 (BP Location: Left Arm)   Pulse 83   Temp 98.8 F (37.1 C) (Oral)   Resp 18   Ht 5\' 3"  (1.6 m)   Wt 90.7 kg   LMP 11/20/2012   SpO2 96%   BMI 35.43 kg/m  Moving all extremities, dressing replaced last night Will discharge tomorrow

## 2022-05-16 NOTE — TOC Transition Note (Signed)
Transition of Care [facility]) - CM/SW Discharge Note   Patient Details  Name: [name] MRN: [id number] Date of Birth: [birth year]  Transition of Care [facility]) CM/SW Contact:  Kermit Balo, RN Phone Number: 05/16/2022, 1:13 PM   Clinical Narrative:    Pt is from home alone but has people that check on her. She had wound vac but incision now closed. [facility] saw her pre admission and are aware she may have dressing changes at d/c. She also was on IV abx at home. This will continue at d/c. [facility] and [facility] aware. Will need new OPAT orders signed by MD prior to d/c home for the IV abx.  Pt states she has transportation home once discharged.    Final next level of care: Home w Home Health Services Barriers to Discharge: No Barriers Identified   Patient Goals and CMS Choice        Discharge Placement                       Discharge Plan and Services                          HH Arranged: RN, PT [facility] Agency: [facility] Date [facility] Agency Contacted: 05/15/22   Representative spoke with at [facility] Agency: Morrie Sheldon  Social Determinants of Health (SDOH) Interventions     Readmission Risk Interventions     No data to display

## 2022-05-16 NOTE — Progress Notes (Signed)
Dressing to pt lower back came off while in bed, neurosurgery notified due to pt's history. Verbal order for wet to dry dressing until they can change dressing later this am. Wet to dry dressing, ABD, and medipore tape applied. Pt tolerated well, will continue to monitor

## 2022-05-17 MED ORDER — OXYCODONE-ACETAMINOPHEN 5-325 MG PO TABS: 1.0000 | ORAL_TABLET | Freq: Four times a day (QID) | ORAL | 0 refills | Status: AC | PRN

## 2022-05-17 MED ORDER — HEPARIN SOD (PORK) LOCK FLUSH 100 UNIT/ML IV SOLN
250.0000 [IU] | INTRAVENOUS | Status: AC | PRN
  Administered 2022-05-17: 250 [IU]

## 2022-05-17 MED ORDER — CEFEPIME IV (FOR PTA / DISCHARGE USE ONLY): 2.0000 g | Freq: Three times a day (TID) | INTRAVENOUS | 0 refills | Status: AC

## 2022-05-17 NOTE — Discharge Instructions (Signed)
Pack wound wet to dry. Do not remove the green sheet.  May shower but no water to rundown back if green sheet is present

## 2022-05-17 NOTE — Discharge Summary (Signed)
Physician Discharge Summary  Patient ID: [name] MRN: [id number] DOB/AGE: [birth year] 61 y.o.  Admit date: 05/12/2022 Discharge date: 05/17/2022  Admission Diagnoses:wound infection.   Discharge Diagnoses:  Principal Problem:   Delayed surgical wound healing Active Problems:   Wound infection after surgery   Discharged Condition: good  Hospital Course: [name] was admitted and taken to the operating room for secondary closure of the wound. She has continued with antibiotics and no evidence active infection. The wound has been packed wet to dry, only the superficial portion remains.   Treatments: surgery: secondary wound closure  Discharge Exam: Blood pressure (!) 142/82, pulse 76, temperature 97.9 F (36.6 C), resp. rate 18, height 5' 3"  (1.6 m), weight 90.7 kg, last menstrual period 11/20/2012, SpO2 100 %. General appearance: alert, cooperative, appears older than stated age, no distress, and wound redressed at discharge. Base is clean  Disposition: Discharge disposition: 01-Home or Self Care      Post op wound infection Discharge Instructions     [facility] pharmacist to adjust dose for Vancomycin, Aminoglycosides and other anti-infective therapies as requested by physician.   Complete by: As directed    [facility] pharmacist to adjust dose for Vancomycin, Aminoglycosides and other anti-infective therapies as requested by physician.   Complete by: As directed    [facility] to provide Cath Flo 57m   Complete by: As directed    Administer for PICC line occlusion and as ordered by physician for other access device issues.   [facility] to provide Cath Flo 229m  Complete by: As directed    Administer for PICC line occlusion and as ordered by physician for other access device issues.   Anaphylaxis Kit: Provided to treat any anaphylactic reaction to the medication being provided to the patient if First Dose or when  requested by physician   Complete by: As directed    Epinephrine 522m28ml vial / amp: Administer 0.22mg29m.22ml)58mbcutaneously once for moderate to severe anaphylaxis, nurse to call physician and pharmacy when reaction occurs and call 911 if needed for immediate care   Diphenhydramine 50mg/65mV vial: Administer 25-50mg I57m PRN for first dose reaction, rash, itching, mild reaction, nurse to call physician and pharmacy when reaction occurs   Sodium Chloride 0.9% NS 500ml IV49mminister if needed for hypovolemic blood pressure drop or as ordered by physician after call to physician with anaphylactic reaction   Anaphylaxis Kit: Provided to treat any anaphylactic reaction to the medication being provided to the patient if First Dose or when requested by physician   Complete by: As directed    Epinephrine 522mg/ml v622m / amp: Administer 0.22mg (0.22m28msubcu55meously once for moderate to severe anaphylaxis, nurse to call physician and pharmacy when reaction occurs and call 911 if needed for immediate care   Diphenhydramine 50mg/ml IV 12m: Administer 25-50mg IV/IM P96mor first dose reaction, rash, itching, mild reaction, nurse to call physician and pharmacy when reaction occurs   Sodium Chloride 0.9% NS 500ml IV: Admi49mer if needed for hypovolemic blood pressure drop or as ordered by physician after call to physician with anaphylactic reaction   Change dressing on IV access line weekly and PRN   Complete by: As directed    Change dressing on IV access line weekly and PRN   Complete by: As directed    Face-to-face encounter (required for Medicare/Medicaid patients)   Complete by: As directed    I Jamail Cullers cAshok Pallthis patient  is under my care and that I, or a nurse practitioner or physician's assistant working with me, had a face-to-face encounter that meets the physician face-to-face encounter requirements with this patient on 05/17/2022. The encounter with the patient was in whole, or in part for  the following medical condition(s) which is the primary reason for home health care (List medical condition): wound infection   The encounter with the patient was in whole, or in part, for the following medical condition, which is the primary reason for home health care: wound infection   I certify that, based on my findings, the following services are medically necessary home health services:  Nursing Physical therapy     Reason for Medically Necessary Home Health Services: Skilled Nursing- Post-Surgical Wound Assessment and Care   My clinical findings support the need for the above services: Unable to leave home safely without assistance and/or assistive device   Further, I certify that my clinical findings support that this patient is homebound due to: Unable to leave home safely without assistance   Flush IV access with Sodium Chloride 0.9% and Heparin 10 units/ml or 100 units/ml   Complete by: As directed    Flush IV access with Sodium Chloride 0.9% and Heparin 10 units/ml or 100 units/ml   Complete by: As directed    Home Health   Complete by: As directed    To provide the following care/treatments:  PT [place] RN     Home infusion instructions - [facility]   Complete by: As directed    Instructions: Flush IV access with Sodium Chloride 0.9% and Heparin 10units/ml or 100units/ml   Change dressing on IV access line: Weekly and PRN   Instructions Cath Flo 26m: Administer for PICC Line occlusion and as ordered by physician for other access device   [facility] pharmacist to adjust dose for: Vancomycin, Aminoglycosides and other anti-infective therapies as requested by physician   Home infusion instructions - [facility]   Complete by: As directed    Instructions: Flush IV access with Sodium Chloride 0.9% and Heparin 10units/ml or 100units/ml   Change dressing on IV access line: Weekly and PRN   Instructions Cath Flo 229m Administer for  PICC Line occlusion and as ordered by physician for other access device   [facility] pharmacist to adjust dose for: Vancomycin, Aminoglycosides and other anti-infective therapies as requested by physician   Method of administration may be changed at the discretion of home infusion pharmacist based upon assessment of the patient and/or caregiver's ability to self-administer the medication ordered   Complete by: As directed    Method of administration may be changed at the discretion of home infusion pharmacist based upon assessment of the patient and/or caregiver's ability to self-administer the medication ordered   Complete by: As directed    Outpatient Parenteral Antibiotic Therapy Information Antibiotic: Cefepime (Maxipime) IVPB; Indications for use: enterobacter wound infection; End Date: 06/02/2022   Complete by: As directed    Antibiotic: Cefepime (Maxipime) IVPB   Indications for use: enterobacter wound infection   End Date: 06/02/2022      Allergies as of 05/17/2022       Reactions   Monosodium Glutamate Anaphylaxis, Swelling   Eyes swollen shut, facial swelling, tongue swelling.   Shellfish Allergy Anaphylaxis   Baclofen Nausea Only   Dizziness and increase muscle spasm   Celebrex [celecoxib] Itching   Only allergic to generic brand   Contrast Media [iodinated  Contrast Media] Itching, Nausea Only   "could not walk"   Diclofenac Itching   Generic Diclefenac gel causes itching. Can take the name brand Voltaren gel   Molds & Smuts Other (See Comments)   Causes allergies to flair up   Other Other (See Comments)   Pet dander,  Green rubber piece to hold mouth open (oval shape)   Sulfa Antibiotics Other (See Comments)   Unknown   Latex Itching, Rash   Latex glove with powder        Medication List     TAKE these medications    acetaminophen 500 MG tablet Commonly known as: TYLENOL Take 1,000 mg by mouth every 8 (eight) hours as needed for moderate pain.    albuterol 108 (90 Base) MCG/ACT inhaler Commonly known as: VENTOLIN HFA Inhale 2 puffs into the lungs every 6 (six) hours as needed for wheezing or shortness of breath. What changed: reasons to take this   ascorbic acid 500 MG tablet Commonly known as: VITAMIN C Take 500 mg by mouth daily.   aspirin EC 81 MG tablet Take 81 mg by mouth daily.   atenolol 50 MG tablet Commonly known as: TENORMIN Take 1 tablet (50 mg total) by mouth daily.   ceFEPime  IVPB Commonly known as: MAXIPIME Inject 2 g into the vein every 8 (eight) hours. Indication:  Enterobacter cloacae lumbar wound infection  First Dose: Yes Last Day of Therapy:  06/02/22  Labs - Once weekly:  CBC/D and BMP, Labs - Every other week:  ESR and CRP Method of administration: IV Push Method of administration may be changed at the discretion of home infusion pharmacist based upon assessment of the patient and/or caregiver's ability to self-administer the medication ordered. What changed: Another medication with the same name was added. Make sure you understand how and when to take each.   ceFEPime  IVPB Commonly known as: MAXIPIME Inject 2 g into the vein every 8 (eight) hours for 17 days. Indication:  enterobacter wound infection First Dose: No Last Day of Therapy:  06/02/2022 Labs - Once weekly:  CBC/D and BMP, Labs - Every other week:  ESR and CRP Method of administration: IV Push Method of administration may be changed at the discretion of home infusion pharmacist based upon assessment of the patient and/or caregiver's ability to self-administer the medication ordered. What changed: You were already taking a medication with the same name, and this prescription was added. Make sure you understand how and when to take each.   ceFEPime  IVPB Commonly known as: MAXIPIME Inject 2 g into the vein every 8 (eight) hours for 16 days. Indication:  enterobacter wound infection First Dose: No Last Day of Therapy:  06/02/2022 Labs  - Once weekly:  CBC/D and BMP, Labs - Every other week:  ESR and CRP Method of administration: IV Push Method of administration may be changed at the discretion of home infusion pharmacist based upon assessment of the patient and/or caregiver's ability to self-administer the medication ordered. What changed: You were already taking a medication with the same name, and this prescription was added. Make sure you understand how and when to take each.   CeleBREX 200 MG capsule Generic drug: celecoxib Take 1 capsule (200 mg total) by mouth 2 (two) times daily. What changed: additional instructions   chlorzoxazone 500 MG tablet Commonly known as: PARAFON Take 1 tablet (500 mg total) by mouth 3 (three) times daily.   cyclobenzaprine 10 MG tablet Commonly known as:  FLEXERIL Take 10 mg by mouth 3 (three) times daily.   diazepam 5 MG tablet Commonly known as: VALIUM Take 1 tablet (5 mg total) by mouth 2 (two) times daily. What changed:  when to take this additional instructions   diclofenac 1.3 % Ptch Commonly known as: FLECTOR Place 1 patch onto the skin 2 (two) times daily as needed (pain.).   diclofenac Sodium 1 % Gel Commonly known as: VOLTAREN Apply 2 g topically 4 (four) times daily as needed (pain).   Diethylpropion HCl CR 75 MG Tb24 Take 75 mg by mouth daily.   diphenhydrAMINE 25 MG tablet Commonly known as: BENADRYL Take 50 mg by mouth in the morning, at noon, in the evening, and at bedtime.   furosemide 20 MG tablet Commonly known as: LASIX Take 2 tablets (40 mg total) by mouth daily. What changed: when to take this   gabapentin 600 MG tablet Commonly known as: NEURONTIN Take 2 tablets (1,200 mg total) by mouth 3 (three) times daily.   guaiFENesin 600 MG 12 hr tablet Commonly known as: MUCINEX Take 600 mg by mouth 2 (two) times daily as needed for cough.   hydrochlorothiazide 25 MG tablet Commonly known as: HYDRODIURIL Take 1 tablet (25 mg total) by mouth  daily. One tablet by mouth in the morning. What changed:  how much to take additional instructions   hydrOXYzine 50 MG tablet Commonly known as: ATARAX Take 50 mg by mouth 2 (two) times daily.   lubiprostone 24 MCG capsule Commonly known as: Amitiza Take 1 capsule (24 mcg total) by mouth 2 (two) times daily with a meal. What changed:  how much to take when to take this   methocarbamol 500 MG tablet Commonly known as: ROBAXIN Take 1 tablet (500 mg total) by mouth every 6 (six) hours as needed for muscle spasms. What changed: when to take this   multivitamin with minerals Tabs tablet Take 1 tablet by mouth daily as needed.   Narcan 4 MG/0.1ML Liqd nasal spray kit Generic drug: naloxone Place 1 spray into the nose once.   omeprazole 20 MG capsule Commonly known as: PRILOSEC Take 20 mg by mouth in the morning and at bedtime.   oxyCODONE-acetaminophen 10-325 MG tablet Commonly known as: PERCOCET Take 1 tablet by mouth every 6 (six) hours as needed for pain. What changed: when to take this   oxyCODONE-acetaminophen 5-325 MG tablet Commonly known as: PERCOCET/ROXICET Take 1 tablet by mouth every 6 (six) hours as needed for up to 8 days for severe pain. What changed: You were already taking a medication with the same name, and this prescription was added. Make sure you understand how and when to take each.   potassium chloride 10 MEQ tablet Commonly known as: KLOR-CON Take 1 tablet (10 mEq total) by mouth daily. What changed: how much to take   senna 8.6 MG Tabs tablet Commonly known as: SENOKOT Take 2 tablets by mouth daily before lunch.   Systane Complete 0.6 % Soln Generic drug: Propylene Glycol Place 1 drop into both eyes daily as needed (dry eyes).   Vitamin D (Ergocalciferol) 1.25 MG (50000 UNIT) Caps capsule Commonly known as: DRISDOL Take 50,000 Units by mouth once a week.   zolpidem 10 MG tablet Commonly known as: AMBIEN Take 10 mg by mouth at bedtime as  needed for sleep.               Discharge Care Instructions  (From admission, onward)  Start     Ordered   05/17/22 0000  Change dressing on IV access line weekly and PRN  (Home infusion instructions - [facility] )        05/17/22 1431   05/16/22 0000  Change dressing on IV access line weekly and PRN  (Home infusion instructions - [facility] )        05/16/22 1629            Follow-up Information     [facility] Follow up.   Why: The home health agency will contact you for the next home visit Contact information: [phone]        Ashok Pall, MD Follow up in 1 week(s).   Specialty: Neurosurgery Why: dressing change Contact information: [address] [place] [address] [phone]                 Signed: Ashok Pall 05/17/2022, 2:39 PM

## 2022-05-17 NOTE — Plan of Care (Signed)

## 2022-05-17 NOTE — Progress Notes (Signed)
Discharge paperwork has been printed and reviewed with patient. Patient verbalizes understanding and has no questions at this time.

## 2022-05-17 NOTE — Progress Notes (Signed)
PT Cancellation Note  Patient Details Name: [name] MRN: [id number] DOB: 1961/03/30   Cancelled Treatment:    Reason Eval/Treat Not Completed: Patient declined, no reason specified Declines PT, states she is leaving today and ready to go home. All questions answered, no further concerns.  Berton Mount 05/17/2022, 3:38 PM

## 2022-05-17 NOTE — TOC Transition Note (Signed)
Transition of Care [facility]) - CM/SW Discharge Note   Patient Details  Name: [name] MRN: [id number] Date of Birth: [birth year]  [facility]) CM/SW Contact:  Harriet Masson, RN Phone Number: 05/17/2022, 3:02 PM   Clinical Narrative:     Spoke to Patient at bedside regarding transition needs. Dr. Franky Macho stated patient needs wet to dry dressings every other day but that Monday Wednesday and Friday is acceptable. Dr. Franky Macho stated if patient can't find anyone to do the dressing she can get it done in the office. Morrie Sheldon with [facility] accepted referral for PT/OT/RN/aide.  Morrie Sheldon with [facility] needs home health nurse dressing orders. Left VM at Dr. Sueanne Margarita office requesting his assistant  return my call. THis RNCM also left Ashley's number with Dr. Sueanne Margarita assistant. Patient has transportation home.  Final next level of care: Home w Home Health Services Barriers to Discharge: Barriers Resolved   Patient Goals and CMS Choice Patient states their goals for this hospitalization and ongoing recovery are:: return home      Discharge Placement               home        Discharge Plan and Services                          HH Arranged: RN, PT, OT, Nurse's Aide HH Agency: [facility] Date HH Agency Contacted: 05/17/22 Time HH Agency Contacted: 1501 Representative spoke with at [facility] Agency: Morrie Sheldon  Social Determinants of Health (SDOH) Interventions     Readmission Risk Interventions    05/17/2022    3:02 PM  Readmission Risk Prevention Plan  Transportation Screening Complete  PCP or Specialist Appt within 5-7 Days Complete  Home Care Screening Complete  Medication Review (RN CM) Complete

## 2022-05-18 DIAGNOSIS — M542 Cervicalgia: Secondary | ICD-10-CM | POA: Diagnosis not present

## 2022-05-18 DIAGNOSIS — M545 Low back pain, unspecified: Secondary | ICD-10-CM | POA: Diagnosis not present

## 2022-05-18 DIAGNOSIS — M25562 Pain in left knee: Secondary | ICD-10-CM | POA: Diagnosis not present

## 2022-05-18 DIAGNOSIS — M62838 Other muscle spasm: Secondary | ICD-10-CM | POA: Diagnosis not present

## 2022-05-18 DIAGNOSIS — Z09 Encounter for follow-up examination after completed treatment for conditions other than malignant neoplasm: Secondary | ICD-10-CM | POA: Diagnosis not present

## 2022-05-18 DIAGNOSIS — M797 Fibromyalgia: Secondary | ICD-10-CM | POA: Diagnosis not present

## 2022-05-18 DIAGNOSIS — F32A Depression, unspecified: Secondary | ICD-10-CM | POA: Diagnosis not present

## 2022-05-18 DIAGNOSIS — Z9181 History of falling: Secondary | ICD-10-CM | POA: Diagnosis not present

## 2022-05-18 DIAGNOSIS — G8929 Other chronic pain: Secondary | ICD-10-CM | POA: Diagnosis not present

## 2022-05-18 DIAGNOSIS — M25561 Pain in right knee: Secondary | ICD-10-CM | POA: Diagnosis not present

## 2022-05-20 DIAGNOSIS — M4316 Spondylolisthesis, lumbar region: Secondary | ICD-10-CM | POA: Diagnosis not present

## 2022-05-22 DIAGNOSIS — Z79891 Long term (current) use of opiate analgesic: Secondary | ICD-10-CM | POA: Diagnosis not present

## 2022-05-22 DIAGNOSIS — Z7982 Long term (current) use of aspirin: Secondary | ICD-10-CM | POA: Diagnosis not present

## 2022-05-22 DIAGNOSIS — B9689 Other specified bacterial agents as the cause of diseases classified elsewhere: Secondary | ICD-10-CM | POA: Diagnosis not present

## 2022-05-22 DIAGNOSIS — Z79899 Other long term (current) drug therapy: Secondary | ICD-10-CM | POA: Diagnosis not present

## 2022-05-22 DIAGNOSIS — Z87891 Personal history of nicotine dependence: Secondary | ICD-10-CM | POA: Diagnosis not present

## 2022-05-22 DIAGNOSIS — K219 Gastro-esophageal reflux disease without esophagitis: Secondary | ICD-10-CM | POA: Diagnosis not present

## 2022-05-22 DIAGNOSIS — Z791 Long term (current) use of non-steroidal anti-inflammatories (NSAID): Secondary | ICD-10-CM | POA: Diagnosis not present

## 2022-05-22 DIAGNOSIS — Z9181 History of falling: Secondary | ICD-10-CM | POA: Diagnosis not present

## 2022-05-22 DIAGNOSIS — M4316 Spondylolisthesis, lumbar region: Secondary | ICD-10-CM | POA: Diagnosis not present

## 2022-05-22 DIAGNOSIS — Z792 Long term (current) use of antibiotics: Secondary | ICD-10-CM | POA: Diagnosis not present

## 2022-05-22 DIAGNOSIS — M797 Fibromyalgia: Secondary | ICD-10-CM | POA: Diagnosis not present

## 2022-05-22 DIAGNOSIS — T8132XA Disruption of internal operation (surgical) wound, not elsewhere classified, initial encounter: Secondary | ICD-10-CM | POA: Diagnosis not present

## 2022-05-22 DIAGNOSIS — F32A Depression, unspecified: Secondary | ICD-10-CM | POA: Diagnosis not present

## 2022-05-22 DIAGNOSIS — T8142XA Infection following a procedure, deep incisional surgical site, initial encounter: Secondary | ICD-10-CM | POA: Diagnosis not present

## 2022-05-22 DIAGNOSIS — I10 Essential (primary) hypertension: Secondary | ICD-10-CM | POA: Diagnosis not present

## 2022-05-22 DIAGNOSIS — Z87440 Personal history of urinary (tract) infections: Secondary | ICD-10-CM | POA: Diagnosis not present

## 2022-05-22 DIAGNOSIS — Z452 Encounter for adjustment and management of vascular access device: Secondary | ICD-10-CM | POA: Diagnosis not present

## 2022-05-23 DIAGNOSIS — Z792 Long term (current) use of antibiotics: Secondary | ICD-10-CM | POA: Diagnosis not present

## 2022-05-23 DIAGNOSIS — I10 Essential (primary) hypertension: Secondary | ICD-10-CM | POA: Diagnosis not present

## 2022-05-23 DIAGNOSIS — Z7982 Long term (current) use of aspirin: Secondary | ICD-10-CM | POA: Diagnosis not present

## 2022-05-23 DIAGNOSIS — Z452 Encounter for adjustment and management of vascular access device: Secondary | ICD-10-CM | POA: Diagnosis not present

## 2022-05-23 DIAGNOSIS — B9689 Other specified bacterial agents as the cause of diseases classified elsewhere: Secondary | ICD-10-CM | POA: Diagnosis not present

## 2022-05-23 DIAGNOSIS — T8142XA Infection following a procedure, deep incisional surgical site, initial encounter: Secondary | ICD-10-CM | POA: Diagnosis not present

## 2022-05-23 DIAGNOSIS — K219 Gastro-esophageal reflux disease without esophagitis: Secondary | ICD-10-CM | POA: Diagnosis not present

## 2022-05-23 DIAGNOSIS — M797 Fibromyalgia: Secondary | ICD-10-CM | POA: Diagnosis not present

## 2022-05-23 DIAGNOSIS — Z87440 Personal history of urinary (tract) infections: Secondary | ICD-10-CM | POA: Diagnosis not present

## 2022-05-23 DIAGNOSIS — Z87891 Personal history of nicotine dependence: Secondary | ICD-10-CM | POA: Diagnosis not present

## 2022-05-23 DIAGNOSIS — M4316 Spondylolisthesis, lumbar region: Secondary | ICD-10-CM | POA: Diagnosis not present

## 2022-05-23 DIAGNOSIS — Z791 Long term (current) use of non-steroidal anti-inflammatories (NSAID): Secondary | ICD-10-CM | POA: Diagnosis not present

## 2022-05-23 DIAGNOSIS — Z9181 History of falling: Secondary | ICD-10-CM | POA: Diagnosis not present

## 2022-05-23 DIAGNOSIS — T8132XA Disruption of internal operation (surgical) wound, not elsewhere classified, initial encounter: Secondary | ICD-10-CM | POA: Diagnosis not present

## 2022-05-23 DIAGNOSIS — F32A Depression, unspecified: Secondary | ICD-10-CM | POA: Diagnosis not present

## 2022-05-23 DIAGNOSIS — Z79891 Long term (current) use of opiate analgesic: Secondary | ICD-10-CM | POA: Diagnosis not present

## 2022-05-24 DIAGNOSIS — K219 Gastro-esophageal reflux disease without esophagitis: Secondary | ICD-10-CM | POA: Diagnosis not present

## 2022-05-24 DIAGNOSIS — T8142XA Infection following a procedure, deep incisional surgical site, initial encounter: Secondary | ICD-10-CM | POA: Diagnosis not present

## 2022-05-24 DIAGNOSIS — Z87440 Personal history of urinary (tract) infections: Secondary | ICD-10-CM | POA: Diagnosis not present

## 2022-05-24 DIAGNOSIS — M4316 Spondylolisthesis, lumbar region: Secondary | ICD-10-CM | POA: Diagnosis not present

## 2022-05-24 DIAGNOSIS — T8132XA Disruption of internal operation (surgical) wound, not elsewhere classified, initial encounter: Secondary | ICD-10-CM | POA: Diagnosis not present

## 2022-05-24 DIAGNOSIS — M797 Fibromyalgia: Secondary | ICD-10-CM | POA: Diagnosis not present

## 2022-05-24 DIAGNOSIS — I10 Essential (primary) hypertension: Secondary | ICD-10-CM | POA: Diagnosis not present

## 2022-05-24 DIAGNOSIS — Z9181 History of falling: Secondary | ICD-10-CM | POA: Diagnosis not present

## 2022-05-24 DIAGNOSIS — Z79891 Long term (current) use of opiate analgesic: Secondary | ICD-10-CM | POA: Diagnosis not present

## 2022-05-24 DIAGNOSIS — B9689 Other specified bacterial agents as the cause of diseases classified elsewhere: Secondary | ICD-10-CM | POA: Diagnosis not present

## 2022-05-24 DIAGNOSIS — F32A Depression, unspecified: Secondary | ICD-10-CM | POA: Diagnosis not present

## 2022-05-24 DIAGNOSIS — Z452 Encounter for adjustment and management of vascular access device: Secondary | ICD-10-CM | POA: Diagnosis not present

## 2022-05-24 DIAGNOSIS — Z792 Long term (current) use of antibiotics: Secondary | ICD-10-CM | POA: Diagnosis not present

## 2022-05-24 DIAGNOSIS — Z7982 Long term (current) use of aspirin: Secondary | ICD-10-CM | POA: Diagnosis not present

## 2022-05-24 DIAGNOSIS — Z791 Long term (current) use of non-steroidal anti-inflammatories (NSAID): Secondary | ICD-10-CM | POA: Diagnosis not present

## 2022-05-24 DIAGNOSIS — Z87891 Personal history of nicotine dependence: Secondary | ICD-10-CM | POA: Diagnosis not present

## 2022-05-25 DIAGNOSIS — M797 Fibromyalgia: Secondary | ICD-10-CM | POA: Diagnosis not present

## 2022-05-25 DIAGNOSIS — Z79891 Long term (current) use of opiate analgesic: Secondary | ICD-10-CM | POA: Diagnosis not present

## 2022-05-25 DIAGNOSIS — Z792 Long term (current) use of antibiotics: Secondary | ICD-10-CM | POA: Diagnosis not present

## 2022-05-25 DIAGNOSIS — Z791 Long term (current) use of non-steroidal anti-inflammatories (NSAID): Secondary | ICD-10-CM | POA: Diagnosis not present

## 2022-05-25 DIAGNOSIS — B9689 Other specified bacterial agents as the cause of diseases classified elsewhere: Secondary | ICD-10-CM | POA: Diagnosis not present

## 2022-05-25 DIAGNOSIS — Z7982 Long term (current) use of aspirin: Secondary | ICD-10-CM | POA: Diagnosis not present

## 2022-05-25 DIAGNOSIS — Z87891 Personal history of nicotine dependence: Secondary | ICD-10-CM | POA: Diagnosis not present

## 2022-05-25 DIAGNOSIS — Z87440 Personal history of urinary (tract) infections: Secondary | ICD-10-CM | POA: Diagnosis not present

## 2022-05-25 DIAGNOSIS — T8132XA Disruption of internal operation (surgical) wound, not elsewhere classified, initial encounter: Secondary | ICD-10-CM | POA: Diagnosis not present

## 2022-05-25 DIAGNOSIS — T8142XA Infection following a procedure, deep incisional surgical site, initial encounter: Secondary | ICD-10-CM | POA: Diagnosis not present

## 2022-05-25 DIAGNOSIS — K219 Gastro-esophageal reflux disease without esophagitis: Secondary | ICD-10-CM | POA: Diagnosis not present

## 2022-05-25 DIAGNOSIS — F32A Depression, unspecified: Secondary | ICD-10-CM | POA: Diagnosis not present

## 2022-05-25 DIAGNOSIS — M4316 Spondylolisthesis, lumbar region: Secondary | ICD-10-CM | POA: Diagnosis not present

## 2022-05-25 DIAGNOSIS — Z9181 History of falling: Secondary | ICD-10-CM | POA: Diagnosis not present

## 2022-05-25 DIAGNOSIS — I10 Essential (primary) hypertension: Secondary | ICD-10-CM | POA: Diagnosis not present

## 2022-05-25 DIAGNOSIS — Z452 Encounter for adjustment and management of vascular access device: Secondary | ICD-10-CM | POA: Diagnosis not present

## 2022-05-27 DIAGNOSIS — T8142XA Infection following a procedure, deep incisional surgical site, initial encounter: Secondary | ICD-10-CM | POA: Diagnosis not present

## 2022-05-28 ENCOUNTER — Telehealth: Payer: Self-pay

## 2022-05-28 NOTE — Telephone Encounter (Signed)
Received the following community message from [facility] team. Will route to Dr. Drue Second to make aware as well.  "Just a heads up.  Katie, RN for this patient called me today originally to set pt up as outpatient for Cath flo since her insurance does not cover at home. After much discussion with Florentina Addison, we decided since pt finishes on 8/27 only 6 more days, and since she is able to infuse without any issues, we will not worry about Cath flo at this time. Just keeping you in the loop in case we do have to reach out if there is any progression before she finishes.  "  Thanks.   Pam

## 2022-05-29 DIAGNOSIS — B9689 Other specified bacterial agents as the cause of diseases classified elsewhere: Secondary | ICD-10-CM | POA: Diagnosis not present

## 2022-05-29 DIAGNOSIS — I10 Essential (primary) hypertension: Secondary | ICD-10-CM | POA: Diagnosis not present

## 2022-05-29 DIAGNOSIS — Z7982 Long term (current) use of aspirin: Secondary | ICD-10-CM | POA: Diagnosis not present

## 2022-05-29 DIAGNOSIS — T8142XA Infection following a procedure, deep incisional surgical site, initial encounter: Secondary | ICD-10-CM | POA: Diagnosis not present

## 2022-05-29 DIAGNOSIS — K219 Gastro-esophageal reflux disease without esophagitis: Secondary | ICD-10-CM | POA: Diagnosis not present

## 2022-05-29 DIAGNOSIS — F32A Depression, unspecified: Secondary | ICD-10-CM | POA: Diagnosis not present

## 2022-05-29 DIAGNOSIS — M4316 Spondylolisthesis, lumbar region: Secondary | ICD-10-CM | POA: Diagnosis not present

## 2022-05-29 DIAGNOSIS — Z452 Encounter for adjustment and management of vascular access device: Secondary | ICD-10-CM | POA: Diagnosis not present

## 2022-05-29 DIAGNOSIS — Z87891 Personal history of nicotine dependence: Secondary | ICD-10-CM | POA: Diagnosis not present

## 2022-05-29 DIAGNOSIS — Z792 Long term (current) use of antibiotics: Secondary | ICD-10-CM | POA: Diagnosis not present

## 2022-05-29 DIAGNOSIS — M797 Fibromyalgia: Secondary | ICD-10-CM | POA: Diagnosis not present

## 2022-05-29 DIAGNOSIS — Z791 Long term (current) use of non-steroidal anti-inflammatories (NSAID): Secondary | ICD-10-CM | POA: Diagnosis not present

## 2022-05-29 DIAGNOSIS — Z9181 History of falling: Secondary | ICD-10-CM | POA: Diagnosis not present

## 2022-05-29 DIAGNOSIS — Z87440 Personal history of urinary (tract) infections: Secondary | ICD-10-CM | POA: Diagnosis not present

## 2022-05-29 DIAGNOSIS — Z79891 Long term (current) use of opiate analgesic: Secondary | ICD-10-CM | POA: Diagnosis not present

## 2022-05-29 DIAGNOSIS — T8132XA Disruption of internal operation (surgical) wound, not elsewhere classified, initial encounter: Secondary | ICD-10-CM | POA: Diagnosis not present

## 2022-05-30 DIAGNOSIS — Z87440 Personal history of urinary (tract) infections: Secondary | ICD-10-CM | POA: Diagnosis not present

## 2022-05-30 DIAGNOSIS — Z7982 Long term (current) use of aspirin: Secondary | ICD-10-CM | POA: Diagnosis not present

## 2022-05-30 DIAGNOSIS — Z79891 Long term (current) use of opiate analgesic: Secondary | ICD-10-CM | POA: Diagnosis not present

## 2022-05-30 DIAGNOSIS — I10 Essential (primary) hypertension: Secondary | ICD-10-CM | POA: Diagnosis not present

## 2022-05-30 DIAGNOSIS — T8142XA Infection following a procedure, deep incisional surgical site, initial encounter: Secondary | ICD-10-CM | POA: Diagnosis not present

## 2022-05-30 DIAGNOSIS — B9689 Other specified bacterial agents as the cause of diseases classified elsewhere: Secondary | ICD-10-CM | POA: Diagnosis not present

## 2022-05-30 DIAGNOSIS — T8132XA Disruption of internal operation (surgical) wound, not elsewhere classified, initial encounter: Secondary | ICD-10-CM | POA: Diagnosis not present

## 2022-05-30 DIAGNOSIS — M797 Fibromyalgia: Secondary | ICD-10-CM | POA: Diagnosis not present

## 2022-05-30 DIAGNOSIS — F32A Depression, unspecified: Secondary | ICD-10-CM | POA: Diagnosis not present

## 2022-05-30 DIAGNOSIS — K219 Gastro-esophageal reflux disease without esophagitis: Secondary | ICD-10-CM | POA: Diagnosis not present

## 2022-05-30 DIAGNOSIS — Z9181 History of falling: Secondary | ICD-10-CM | POA: Diagnosis not present

## 2022-05-30 DIAGNOSIS — M4316 Spondylolisthesis, lumbar region: Secondary | ICD-10-CM | POA: Diagnosis not present

## 2022-05-30 DIAGNOSIS — Z792 Long term (current) use of antibiotics: Secondary | ICD-10-CM | POA: Diagnosis not present

## 2022-05-30 DIAGNOSIS — Z87891 Personal history of nicotine dependence: Secondary | ICD-10-CM | POA: Diagnosis not present

## 2022-05-30 DIAGNOSIS — Z452 Encounter for adjustment and management of vascular access device: Secondary | ICD-10-CM | POA: Diagnosis not present

## 2022-05-30 DIAGNOSIS — Z791 Long term (current) use of non-steroidal anti-inflammatories (NSAID): Secondary | ICD-10-CM | POA: Diagnosis not present

## 2022-05-31 DIAGNOSIS — Z79891 Long term (current) use of opiate analgesic: Secondary | ICD-10-CM | POA: Diagnosis not present

## 2022-05-31 DIAGNOSIS — I10 Essential (primary) hypertension: Secondary | ICD-10-CM | POA: Diagnosis not present

## 2022-05-31 DIAGNOSIS — Z87891 Personal history of nicotine dependence: Secondary | ICD-10-CM | POA: Diagnosis not present

## 2022-05-31 DIAGNOSIS — Z792 Long term (current) use of antibiotics: Secondary | ICD-10-CM | POA: Diagnosis not present

## 2022-05-31 DIAGNOSIS — K219 Gastro-esophageal reflux disease without esophagitis: Secondary | ICD-10-CM | POA: Diagnosis not present

## 2022-05-31 DIAGNOSIS — T8142XA Infection following a procedure, deep incisional surgical site, initial encounter: Secondary | ICD-10-CM | POA: Diagnosis not present

## 2022-05-31 DIAGNOSIS — Z7982 Long term (current) use of aspirin: Secondary | ICD-10-CM | POA: Diagnosis not present

## 2022-05-31 DIAGNOSIS — M797 Fibromyalgia: Secondary | ICD-10-CM | POA: Diagnosis not present

## 2022-05-31 DIAGNOSIS — Z87440 Personal history of urinary (tract) infections: Secondary | ICD-10-CM | POA: Diagnosis not present

## 2022-05-31 DIAGNOSIS — Z9181 History of falling: Secondary | ICD-10-CM | POA: Diagnosis not present

## 2022-05-31 DIAGNOSIS — M4316 Spondylolisthesis, lumbar region: Secondary | ICD-10-CM | POA: Diagnosis not present

## 2022-05-31 DIAGNOSIS — F32A Depression, unspecified: Secondary | ICD-10-CM | POA: Diagnosis not present

## 2022-05-31 DIAGNOSIS — T8132XA Disruption of internal operation (surgical) wound, not elsewhere classified, initial encounter: Secondary | ICD-10-CM | POA: Diagnosis not present

## 2022-05-31 DIAGNOSIS — Z791 Long term (current) use of non-steroidal anti-inflammatories (NSAID): Secondary | ICD-10-CM | POA: Diagnosis not present

## 2022-05-31 DIAGNOSIS — Z452 Encounter for adjustment and management of vascular access device: Secondary | ICD-10-CM | POA: Diagnosis not present

## 2022-05-31 DIAGNOSIS — B9689 Other specified bacterial agents as the cause of diseases classified elsewhere: Secondary | ICD-10-CM | POA: Diagnosis not present

## 2022-06-03 DIAGNOSIS — M4316 Spondylolisthesis, lumbar region: Secondary | ICD-10-CM | POA: Diagnosis not present

## 2022-06-03 DIAGNOSIS — I10 Essential (primary) hypertension: Secondary | ICD-10-CM | POA: Diagnosis not present

## 2022-06-03 DIAGNOSIS — T8142XA Infection following a procedure, deep incisional surgical site, initial encounter: Secondary | ICD-10-CM | POA: Diagnosis not present

## 2022-06-03 DIAGNOSIS — Z79891 Long term (current) use of opiate analgesic: Secondary | ICD-10-CM | POA: Diagnosis not present

## 2022-06-03 DIAGNOSIS — M797 Fibromyalgia: Secondary | ICD-10-CM | POA: Diagnosis not present

## 2022-06-03 DIAGNOSIS — F32A Depression, unspecified: Secondary | ICD-10-CM | POA: Diagnosis not present

## 2022-06-03 DIAGNOSIS — B9689 Other specified bacterial agents as the cause of diseases classified elsewhere: Secondary | ICD-10-CM | POA: Diagnosis not present

## 2022-06-03 DIAGNOSIS — Z791 Long term (current) use of non-steroidal anti-inflammatories (NSAID): Secondary | ICD-10-CM | POA: Diagnosis not present

## 2022-06-03 DIAGNOSIS — T8132XA Disruption of internal operation (surgical) wound, not elsewhere classified, initial encounter: Secondary | ICD-10-CM | POA: Diagnosis not present

## 2022-06-03 DIAGNOSIS — Z792 Long term (current) use of antibiotics: Secondary | ICD-10-CM | POA: Diagnosis not present

## 2022-06-03 DIAGNOSIS — Z9181 History of falling: Secondary | ICD-10-CM | POA: Diagnosis not present

## 2022-06-03 DIAGNOSIS — Z87440 Personal history of urinary (tract) infections: Secondary | ICD-10-CM | POA: Diagnosis not present

## 2022-06-03 DIAGNOSIS — Z452 Encounter for adjustment and management of vascular access device: Secondary | ICD-10-CM | POA: Diagnosis not present

## 2022-06-03 DIAGNOSIS — Z7982 Long term (current) use of aspirin: Secondary | ICD-10-CM | POA: Diagnosis not present

## 2022-06-03 DIAGNOSIS — Z87891 Personal history of nicotine dependence: Secondary | ICD-10-CM | POA: Diagnosis not present

## 2022-06-03 DIAGNOSIS — K219 Gastro-esophageal reflux disease without esophagitis: Secondary | ICD-10-CM | POA: Diagnosis not present

## 2022-06-05 ENCOUNTER — Ambulatory Visit: Payer: Self-pay

## 2022-06-05 DIAGNOSIS — B9689 Other specified bacterial agents as the cause of diseases classified elsewhere: Secondary | ICD-10-CM | POA: Diagnosis not present

## 2022-06-05 DIAGNOSIS — Z9181 History of falling: Secondary | ICD-10-CM | POA: Diagnosis not present

## 2022-06-05 DIAGNOSIS — T8132XA Disruption of internal operation (surgical) wound, not elsewhere classified, initial encounter: Secondary | ICD-10-CM | POA: Diagnosis not present

## 2022-06-05 DIAGNOSIS — K219 Gastro-esophageal reflux disease without esophagitis: Secondary | ICD-10-CM | POA: Diagnosis not present

## 2022-06-05 DIAGNOSIS — Z87891 Personal history of nicotine dependence: Secondary | ICD-10-CM | POA: Diagnosis not present

## 2022-06-05 DIAGNOSIS — Z87440 Personal history of urinary (tract) infections: Secondary | ICD-10-CM | POA: Diagnosis not present

## 2022-06-05 DIAGNOSIS — Z792 Long term (current) use of antibiotics: Secondary | ICD-10-CM | POA: Diagnosis not present

## 2022-06-05 DIAGNOSIS — M797 Fibromyalgia: Secondary | ICD-10-CM | POA: Diagnosis not present

## 2022-06-05 DIAGNOSIS — Z79891 Long term (current) use of opiate analgesic: Secondary | ICD-10-CM | POA: Diagnosis not present

## 2022-06-05 DIAGNOSIS — F32A Depression, unspecified: Secondary | ICD-10-CM | POA: Diagnosis not present

## 2022-06-05 DIAGNOSIS — I10 Essential (primary) hypertension: Secondary | ICD-10-CM | POA: Diagnosis not present

## 2022-06-05 DIAGNOSIS — T8142XA Infection following a procedure, deep incisional surgical site, initial encounter: Secondary | ICD-10-CM | POA: Diagnosis not present

## 2022-06-05 DIAGNOSIS — Z7982 Long term (current) use of aspirin: Secondary | ICD-10-CM | POA: Diagnosis not present

## 2022-06-05 DIAGNOSIS — M4316 Spondylolisthesis, lumbar region: Secondary | ICD-10-CM | POA: Diagnosis not present

## 2022-06-05 DIAGNOSIS — Z452 Encounter for adjustment and management of vascular access device: Secondary | ICD-10-CM | POA: Diagnosis not present

## 2022-06-05 DIAGNOSIS — Z791 Long term (current) use of non-steroidal anti-inflammatories (NSAID): Secondary | ICD-10-CM | POA: Diagnosis not present

## 2022-06-06 DIAGNOSIS — Z791 Long term (current) use of non-steroidal anti-inflammatories (NSAID): Secondary | ICD-10-CM | POA: Diagnosis not present

## 2022-06-06 DIAGNOSIS — T8142XA Infection following a procedure, deep incisional surgical site, initial encounter: Secondary | ICD-10-CM | POA: Diagnosis not present

## 2022-06-06 DIAGNOSIS — F32A Depression, unspecified: Secondary | ICD-10-CM | POA: Diagnosis not present

## 2022-06-06 DIAGNOSIS — I10 Essential (primary) hypertension: Secondary | ICD-10-CM | POA: Diagnosis not present

## 2022-06-06 DIAGNOSIS — K219 Gastro-esophageal reflux disease without esophagitis: Secondary | ICD-10-CM | POA: Diagnosis not present

## 2022-06-06 DIAGNOSIS — Z79891 Long term (current) use of opiate analgesic: Secondary | ICD-10-CM | POA: Diagnosis not present

## 2022-06-06 DIAGNOSIS — M797 Fibromyalgia: Secondary | ICD-10-CM | POA: Diagnosis not present

## 2022-06-06 DIAGNOSIS — Z792 Long term (current) use of antibiotics: Secondary | ICD-10-CM | POA: Diagnosis not present

## 2022-06-06 DIAGNOSIS — B9689 Other specified bacterial agents as the cause of diseases classified elsewhere: Secondary | ICD-10-CM | POA: Diagnosis not present

## 2022-06-06 DIAGNOSIS — Z452 Encounter for adjustment and management of vascular access device: Secondary | ICD-10-CM | POA: Diagnosis not present

## 2022-06-06 DIAGNOSIS — Z7982 Long term (current) use of aspirin: Secondary | ICD-10-CM | POA: Diagnosis not present

## 2022-06-06 DIAGNOSIS — M4316 Spondylolisthesis, lumbar region: Secondary | ICD-10-CM | POA: Diagnosis not present

## 2022-06-06 DIAGNOSIS — T8132XA Disruption of internal operation (surgical) wound, not elsewhere classified, initial encounter: Secondary | ICD-10-CM | POA: Diagnosis not present

## 2022-06-06 DIAGNOSIS — Z87891 Personal history of nicotine dependence: Secondary | ICD-10-CM | POA: Diagnosis not present

## 2022-06-06 DIAGNOSIS — Z87440 Personal history of urinary (tract) infections: Secondary | ICD-10-CM | POA: Diagnosis not present

## 2022-06-06 DIAGNOSIS — Z9181 History of falling: Secondary | ICD-10-CM | POA: Diagnosis not present

## 2022-06-07 ENCOUNTER — Telehealth: Payer: Self-pay

## 2022-06-07 DIAGNOSIS — K219 Gastro-esophageal reflux disease without esophagitis: Secondary | ICD-10-CM | POA: Diagnosis not present

## 2022-06-07 DIAGNOSIS — Z79891 Long term (current) use of opiate analgesic: Secondary | ICD-10-CM | POA: Diagnosis not present

## 2022-06-07 DIAGNOSIS — Z792 Long term (current) use of antibiotics: Secondary | ICD-10-CM | POA: Diagnosis not present

## 2022-06-07 DIAGNOSIS — Z87891 Personal history of nicotine dependence: Secondary | ICD-10-CM | POA: Diagnosis not present

## 2022-06-07 DIAGNOSIS — Z7982 Long term (current) use of aspirin: Secondary | ICD-10-CM | POA: Diagnosis not present

## 2022-06-07 DIAGNOSIS — T8142XA Infection following a procedure, deep incisional surgical site, initial encounter: Secondary | ICD-10-CM | POA: Diagnosis not present

## 2022-06-07 DIAGNOSIS — Z87440 Personal history of urinary (tract) infections: Secondary | ICD-10-CM | POA: Diagnosis not present

## 2022-06-07 DIAGNOSIS — M797 Fibromyalgia: Secondary | ICD-10-CM | POA: Diagnosis not present

## 2022-06-07 DIAGNOSIS — Z452 Encounter for adjustment and management of vascular access device: Secondary | ICD-10-CM | POA: Diagnosis not present

## 2022-06-07 DIAGNOSIS — B9689 Other specified bacterial agents as the cause of diseases classified elsewhere: Secondary | ICD-10-CM | POA: Diagnosis not present

## 2022-06-07 DIAGNOSIS — T8132XA Disruption of internal operation (surgical) wound, not elsewhere classified, initial encounter: Secondary | ICD-10-CM | POA: Diagnosis not present

## 2022-06-07 DIAGNOSIS — Z791 Long term (current) use of non-steroidal anti-inflammatories (NSAID): Secondary | ICD-10-CM | POA: Diagnosis not present

## 2022-06-07 DIAGNOSIS — I10 Essential (primary) hypertension: Secondary | ICD-10-CM | POA: Diagnosis not present

## 2022-06-07 DIAGNOSIS — Z9181 History of falling: Secondary | ICD-10-CM | POA: Diagnosis not present

## 2022-06-07 DIAGNOSIS — F32A Depression, unspecified: Secondary | ICD-10-CM | POA: Diagnosis not present

## 2022-06-07 DIAGNOSIS — M4316 Spondylolisthesis, lumbar region: Secondary | ICD-10-CM | POA: Diagnosis not present

## 2022-06-07 NOTE — Telephone Encounter (Signed)
[name] with [facility] called requesting pull picc orders for the patient. Patient missed her follow up with Dr. Thedore Mins on 05/03/22. IV antibiotics ended on 06/02/22. Please advise

## 2022-06-11 DIAGNOSIS — M48062 Spinal stenosis, lumbar region with neurogenic claudication: Secondary | ICD-10-CM | POA: Diagnosis not present

## 2022-06-13 ENCOUNTER — Other Ambulatory Visit: Payer: Self-pay | Admitting: Internal Medicine

## 2022-06-13 MED ORDER — SULFAMETHOXAZOLE-TRIMETHOPRIM 800-160 MG PO TABS: 1.0000 | ORAL_TABLET | Freq: Two times a day (BID) | ORAL | 5 refills | Status: DC

## 2022-06-13 NOTE — Telephone Encounter (Signed)
Patient advised she needs a follow up with Dr. Thedore Mins. Patient scheduled for tomorrow and will need picc removed in office

## 2022-06-13 NOTE — Progress Notes (Signed)
Rx bactrim 1 ds po bid after PICC pulled. She did not recall allergy to bactrim, SNF continue to monitor for any reaction. Pt needs to be seen in clinic.

## 2022-06-14 ENCOUNTER — Other Ambulatory Visit: Payer: Self-pay

## 2022-06-14 ENCOUNTER — Ambulatory Visit (INDEPENDENT_AMBULATORY_CARE_PROVIDER_SITE_OTHER): Payer: Medicare Other | Admitting: Internal Medicine

## 2022-06-14 ENCOUNTER — Encounter: Payer: Self-pay | Admitting: Internal Medicine

## 2022-06-14 VITALS — BP 145/82 | HR 62 | Resp 16 | Ht 63.0 in | Wt 202.3 lb

## 2022-06-14 DIAGNOSIS — M797 Fibromyalgia: Secondary | ICD-10-CM | POA: Diagnosis not present

## 2022-06-14 DIAGNOSIS — B9689 Other specified bacterial agents as the cause of diseases classified elsewhere: Secondary | ICD-10-CM | POA: Diagnosis not present

## 2022-06-14 DIAGNOSIS — K219 Gastro-esophageal reflux disease without esophagitis: Secondary | ICD-10-CM | POA: Diagnosis not present

## 2022-06-14 DIAGNOSIS — I10 Essential (primary) hypertension: Secondary | ICD-10-CM | POA: Diagnosis not present

## 2022-06-14 DIAGNOSIS — Z9181 History of falling: Secondary | ICD-10-CM | POA: Diagnosis not present

## 2022-06-14 DIAGNOSIS — Z452 Encounter for adjustment and management of vascular access device: Secondary | ICD-10-CM | POA: Diagnosis not present

## 2022-06-14 DIAGNOSIS — Z79891 Long term (current) use of opiate analgesic: Secondary | ICD-10-CM | POA: Diagnosis not present

## 2022-06-14 DIAGNOSIS — M4316 Spondylolisthesis, lumbar region: Secondary | ICD-10-CM | POA: Diagnosis not present

## 2022-06-14 DIAGNOSIS — T8463XD Infection and inflammatory reaction due to internal fixation device of spine, subsequent encounter: Secondary | ICD-10-CM | POA: Diagnosis not present

## 2022-06-14 DIAGNOSIS — T8132XA Disruption of internal operation (surgical) wound, not elsewhere classified, initial encounter: Secondary | ICD-10-CM | POA: Diagnosis not present

## 2022-06-14 DIAGNOSIS — Z87891 Personal history of nicotine dependence: Secondary | ICD-10-CM | POA: Diagnosis not present

## 2022-06-14 DIAGNOSIS — T8142XA Infection following a procedure, deep incisional surgical site, initial encounter: Secondary | ICD-10-CM | POA: Diagnosis not present

## 2022-06-14 DIAGNOSIS — Z87440 Personal history of urinary (tract) infections: Secondary | ICD-10-CM | POA: Diagnosis not present

## 2022-06-14 DIAGNOSIS — Z792 Long term (current) use of antibiotics: Secondary | ICD-10-CM | POA: Diagnosis not present

## 2022-06-14 DIAGNOSIS — Z7982 Long term (current) use of aspirin: Secondary | ICD-10-CM | POA: Diagnosis not present

## 2022-06-14 DIAGNOSIS — T8140XS Infection following a procedure, unspecified, sequela: Secondary | ICD-10-CM

## 2022-06-14 DIAGNOSIS — Z791 Long term (current) use of non-steroidal anti-inflammatories (NSAID): Secondary | ICD-10-CM | POA: Diagnosis not present

## 2022-06-14 DIAGNOSIS — F32A Depression, unspecified: Secondary | ICD-10-CM | POA: Diagnosis not present

## 2022-06-14 NOTE — Progress Notes (Unsigned)
PICC Removal    PICC length & location:  right basilic 40 cm  Removed per verbal order from: Dr. Thedore Mins  Blood thinners:  aspirin 81 mg Platelet count:  304 (06/03/22 [facility])  Site assessment: Dressing clean and dry. Extremity warm and dry with palpable radial pulse. No redness, drainage, or swelling present at insertion site.   Pre-removal vital signs: obtained by CMA BP:  132/78 HR:  76 SpO2:  95%  Patient unable to tolerate flat, supine position. No sutures present. Insertion site cleaned with CHG, catheter removed and petroleum dressing applied. Tip intact. Pressure held until hemostasis achieved.    Length of catheter removed:  40 cm   Provided patient with after care instructions and precautions print out (via [employer]).   Patient verbalized understanding and agreement, all questions answered. Patient tolerated procedure well and remained in clinic under the care of RN 30 minutes post removal.  Post-observation vital signs:  BP:  145/82 HR:  62 SpO2:  99% RA  Notified Jeri Modena, RN with [facility] pharmacy team of removal.  Sandie Ano, RN

## 2022-06-14 NOTE — Progress Notes (Unsigned)
Patient Active Problem List   Diagnosis Date Noted   Wound infection after surgery 05/14/2022   Delayed surgical wound healing 05/12/2022   Postoperative wound infection 04/08/2022   Scoliosis due to degenerative disease of spine in adult patient 03/30/2022   HNP (herniated nucleus pulposus), cervical 12/08/2020   Lumbago with sciatica, unspecified side 07/28/2020   PICC (peripherally inserted central catheter) in place 03/16/2020   Encounter for long-term (current) use of antibiotics 03/16/2020   Polypharmacy 03/09/2020   At risk for adverse drug event 03/09/2020   MSSA bacteremia 02/29/2020   Postoperative infection 02/28/2020   Abnormal gait due to muscle weakness 02/26/2020   HNP (herniated nucleus pulposus), lumbar 02/16/2020   Muscle weakness of lower extremity 02/14/2020   Spondylolisthesis of lumbar region 10/30/2017   Painful orthopaedic hardware right ankle 05/27/2016   Closed fracture of distal end of right fibula and tibia    Ankle syndesmosis disruption    ASCUS favor benign 01/06/2014   Leg swelling 12/22/2013   Seasonal allergies    Anemia    Chronic back pain due to DJD with history surgery    Hyperthyroidism    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 03/24/2012   Status post left total prosthetic replacement of knee joint using cement 03/24/2012   Fibromyalgia    GERD (gastroesophageal reflux disease)    Anxiety    Depression    Hypertension    Lumbar degenerative disc disease 09/25/2011    Patient's Medications  New Prescriptions   No medications on file  Previous Medications   ACETAMINOPHEN (TYLENOL) 500 MG TABLET    Take 1,000 mg by mouth every 8 (eight) hours as needed for moderate pain.   ALBUTEROL (VENTOLIN HFA) 108 (90 BASE) MCG/ACT INHALER    Inhale 2 puffs into the lungs every 6 (six) hours as needed for wheezing or shortness of breath.   ASCORBIC ACID (VITAMIN C) 500 MG TABLET    Take 500 mg by mouth daily.    ASPIRIN EC 81 MG TABLET    Take 81 mg by mouth daily.   ATENOLOL (TENORMIN) 50 MG TABLET    Take 1 tablet (50 mg total) by mouth daily.   CEFEPIME (MAXIPIME) IVPB    Inject 2 g into the vein every 8 (eight) hours. Indication:  Enterobacter cloacae lumbar wound infection  First Dose: Yes Last Day of Therapy:  06/02/22  Labs - Once weekly:  CBC/D and BMP, Labs - Every other week:  ESR and CRP Method of administration: IV Push Method of administration may be changed at the discretion of home infusion pharmacist based upon assessment of the patient and/or caregiver's ability to self-administer the medication ordered.   CELEBREX 200 MG CAPSULE    Take 1 capsule (200 mg total) by mouth 2 (two) times daily.   CHLORZOXAZONE (PARAFON) 500 MG TABLET    Take 1 tablet (500 mg total) by mouth 3 (three) times daily.   CYCLOBENZAPRINE (FLEXERIL) 10 MG TABLET    Take 10 mg by mouth 3 (three) times daily.   DIAZEPAM (VALIUM) 5 MG TABLET    Take 1 tablet (5 mg total) by mouth 2 (two) times daily.   DICLOFENAC (FLECTOR) 1.3 % PTCH    Place 1 patch onto the skin 2 (two) times daily as needed (pain.).   DICLOFENAC SODIUM (VOLTAREN) 1 % GEL    Apply 2 g topically 4 (four) times daily as needed (pain).  DIETHYLPROPION HCL CR 75 MG TB24    Take 75 mg by mouth daily.   DIPHENHYDRAMINE (BENADRYL) 25 MG TABLET    Take 50 mg by mouth in the morning, at noon, in the evening, and at bedtime.   FUROSEMIDE (LASIX) 20 MG TABLET    Take 2 tablets (40 mg total) by mouth daily.   GABAPENTIN (NEURONTIN) 600 MG TABLET    Take 2 tablets (1,200 mg total) by mouth 3 (three) times daily.   GUAIFENESIN (MUCINEX) 600 MG 12 HR TABLET    Take 600 mg by mouth 2 (two) times daily as needed for cough.   HYDROCHLOROTHIAZIDE (HYDRODIURIL) 25 MG TABLET    Take 1 tablet (25 mg total) by mouth daily. One tablet by mouth in the morning.   HYDROXYZINE (ATARAX/VISTARIL) 50 MG TABLET    Take 50 mg by mouth 2 (two) times daily.   LUBIPROSTONE (AMITIZA)  24 MCG CAPSULE    Take 1 capsule (24 mcg total) by mouth 2 (two) times daily with a meal.   METHOCARBAMOL (ROBAXIN) 500 MG TABLET    Take 1 tablet (500 mg total) by mouth every 6 (six) hours as needed for muscle spasms.   MULTIPLE VITAMIN (MULTIVITAMIN WITH MINERALS) TABS TABLET    Take 1 tablet by mouth daily as needed.   NARCAN 4 MG/0.1ML LIQD NASAL SPRAY KIT    Place 1 spray into the nose once.   OMEPRAZOLE (PRILOSEC) 20 MG CAPSULE    Take 20 mg by mouth in the morning and at bedtime.    OXYCODONE-ACETAMINOPHEN (PERCOCET) 10-325 MG TABLET    Take 1 tablet by mouth every 6 (six) hours as needed for pain.   POTASSIUM CHLORIDE (KLOR-CON) 10 MEQ TABLET    Take 1 tablet (10 mEq total) by mouth daily.   PROPYLENE GLYCOL (SYSTANE COMPLETE) 0.6 % SOLN    Place 1 drop into both eyes daily as needed (dry eyes).   SENNA (SENOKOT) 8.6 MG TABS TABLET    Take 2 tablets by mouth daily before lunch.   SULFAMETHOXAZOLE-TRIMETHOPRIM (BACTRIM DS) 800-160 MG TABLET    Take 1 tablet by mouth 2 (two) times daily.   VITAMIN D, ERGOCALCIFEROL, (DRISDOL) 1.25 MG (50000 UNIT) CAPS CAPSULE    Take 50,000 Units by mouth once a week.   ZOLPIDEM (AMBIEN) 10 MG TABLET    Take 10 mg by mouth at bedtime as needed for sleep.  Modified Medications   No medications on file  Discontinued Medications   No medications on file    Subjective: ***   Review of Systems: ROS  Past Medical History:  Diagnosis Date   Anemia    Ankle syndesmosis disruption    Anxiety    takes Ativan and Valium, after mother passed   Arthritis    bilateral knees s/p knee replacement bilaterally   Asthma    Bronchitis    Bruising    pt states unexplained d/t fibromyalgia   Chronic back pain    2012 tailbone surgery and 3 lower discs.    Closed fracture of distal end of right fibula and tibia    Depression    from Fibromyalgia diagnosis; not taking medicine. since 2001   Dizziness    rarely   Fibromyalgia    diagnosed 2001   GERD  (gastroesophageal reflux disease)    Prilosec occasionally   Headache(784.0)    "sinus headaches"   History of hiatal hernia    Hypertension    since 2013  Hyperthyroidism    subclinical, no treatment; thyroid nodules   IBS (irritable bowel syndrome)    Impaired memory    states from fibromyalgia   Insomnia    takes Ambien   Jones fracture    left foot fifth metatarsal   Multiple allergies    including latex, pet dander, shellfish, pet dander   Painful orthopaedic hardware right ankle 05/27/2016   Seasonal allergies    Shortness of breath    Occasional with exertion;    Sore gums    this is why pt is on Amoxil-only takes for dental work   Tachycardia    Thyroid goiter    Varicose vein    protrudes above skin-per pt;vein popped and bruised;ultrasound done to make sure that there were no clots;noclots were found    Social History   Tobacco Use   Smoking status: Former    Packs/day: 0.50    Years: 10.00    Total pack years: 5.00    Types: Cigarettes    Quit date: 12/06/1998    Years since quitting: 23.5   Smokeless tobacco: Never  Vaping Use   Vaping Use: Never used  Substance Use Topics   Alcohol use: No   Drug use: No    Family History  Problem Relation Age of Onset   Stroke Father        passed 2004 from pneumonia   Alcohol abuse Father    Pulmonary embolism Mother        after minor knee surgery leading to DVT   Arthritis Sister    Depression Sister    Hypertension Sister    Diabetes Other        grandmother   Anesthesia problems Neg Hx    Hypotension Neg Hx    Malignant hyperthermia Neg Hx    Pseudochol deficiency Neg Hx     Allergies  Allergen Reactions   Monosodium Glutamate Anaphylaxis and Swelling    Eyes swollen shut, facial swelling, tongue swelling.   Shellfish Allergy Anaphylaxis   Baclofen Nausea Only    Dizziness and increase muscle spasm   Celebrex [Celecoxib] Itching    Only allergic to generic brand   Contrast Media [Iodinated  Contrast Media] Itching and Nausea Only    "could not walk"   Diclofenac Itching    Generic Diclefenac gel causes itching. Can take the name brand Voltaren gel   Molds & Smuts Other (See Comments)    Causes allergies to flair up   Other Other (See Comments)    Pet dander,   Green rubber piece to hold mouth open (oval shape)     Sulfa Antibiotics Other (See Comments)    Unknown   Latex Itching and Rash    Latex glove with powder    Health Maintenance  Topic Date Due   COVID-19 Vaccine (1) Never done   Hepatitis C Screening  Never done   Zoster Vaccines- Shingrix (1 of 2) Never done   COLONOSCOPY (Pts 45-51yr Insurance coverage will need to be confirmed)  Never done   PAP SMEAR-Modifier  12/12/2018   TETANUS/TDAP  05/08/2020   INFLUENZA VACCINE  05/07/2022   MAMMOGRAM  12/22/2023   HIV Screening  Completed   HPV VACCINES  Aged Out    Objective:  Vitals:   06/14/22 1031  Weight: 202 lb 4.8 oz (91.8 kg)  Height: 5' 3"  (1.6 m)   Body mass index is 35.84 kg/m.  Physical Exam  Lab Results Lab Results  Component  Value Date   WBC 12.0 (H) 05/14/2022   HGB 9.6 (L) 05/14/2022   HCT 34.2 (L) 05/14/2022   MCV 68.4 (L) 05/14/2022   PLT 423 (H) 05/14/2022    Lab Results  Component Value Date   CREATININE 0.59 05/14/2022   BUN 13 05/12/2022   NA 139 05/12/2022   K 4.0 05/12/2022   CL 111 05/12/2022   CO2 21 (L) 05/12/2022    Lab Results  Component Value Date   ALT 5 (A) 03/22/2020   AST 22 03/22/2020   ALKPHOS 192 (A) 03/22/2020   BILITOT 1.0 03/02/2020    Lab Results  Component Value Date   CHOL 196 12/30/2013   HDL 70 12/30/2013   LDLCALC 106 (H) 12/30/2013   TRIG 102 12/30/2013   CHOLHDL 2.8 12/30/2013   Lab Results  Component Value Date   LABRPR NON REAC 12/30/2013   No results found for: "HIV1RNAQUANT", "HIV1RNAVL", "CD4TABS"   A/P #Postop op lumbar wound with retained HWR with Cx+ Enterobacter cloacae #Hx of MSSA lumbar hardware  infection 9/2 last day of antibiotics Seen by Dr. Cyndy Freeze on Tuesday, follow-up on 10/5 -Does not recall being on Bactrim-sulfa.  Allergy was added on 5/13 while she was on Keflex.  She has side effect of hyperpigmentation.  I suspect that Bactrim allergy may have been a Keflex allergy. -8/28 lavs wbc 7.6k, scr 0.49, esr 45, crp 14 -start bactrim bid, plan on 1 year suppression -f/u 1 month with labs off of antibiotics, may nee to re-image.     Laurice Record, MD [facility] 06/14/2022, 10:39 AM

## 2022-06-18 DIAGNOSIS — T8132XA Disruption of internal operation (surgical) wound, not elsewhere classified, initial encounter: Secondary | ICD-10-CM | POA: Diagnosis not present

## 2022-06-18 DIAGNOSIS — Z79891 Long term (current) use of opiate analgesic: Secondary | ICD-10-CM | POA: Diagnosis not present

## 2022-06-18 DIAGNOSIS — F32A Depression, unspecified: Secondary | ICD-10-CM | POA: Diagnosis not present

## 2022-06-18 DIAGNOSIS — B9689 Other specified bacterial agents as the cause of diseases classified elsewhere: Secondary | ICD-10-CM | POA: Diagnosis not present

## 2022-06-18 DIAGNOSIS — Z791 Long term (current) use of non-steroidal anti-inflammatories (NSAID): Secondary | ICD-10-CM | POA: Diagnosis not present

## 2022-06-18 DIAGNOSIS — I10 Essential (primary) hypertension: Secondary | ICD-10-CM | POA: Diagnosis not present

## 2022-06-18 DIAGNOSIS — Z9181 History of falling: Secondary | ICD-10-CM | POA: Diagnosis not present

## 2022-06-18 DIAGNOSIS — Z792 Long term (current) use of antibiotics: Secondary | ICD-10-CM | POA: Diagnosis not present

## 2022-06-18 DIAGNOSIS — M797 Fibromyalgia: Secondary | ICD-10-CM | POA: Diagnosis not present

## 2022-06-18 DIAGNOSIS — Z87440 Personal history of urinary (tract) infections: Secondary | ICD-10-CM | POA: Diagnosis not present

## 2022-06-18 DIAGNOSIS — T8142XA Infection following a procedure, deep incisional surgical site, initial encounter: Secondary | ICD-10-CM | POA: Diagnosis not present

## 2022-06-18 DIAGNOSIS — Z87891 Personal history of nicotine dependence: Secondary | ICD-10-CM | POA: Diagnosis not present

## 2022-06-18 DIAGNOSIS — Z452 Encounter for adjustment and management of vascular access device: Secondary | ICD-10-CM | POA: Diagnosis not present

## 2022-06-18 DIAGNOSIS — K219 Gastro-esophageal reflux disease without esophagitis: Secondary | ICD-10-CM | POA: Diagnosis not present

## 2022-06-18 DIAGNOSIS — Z7982 Long term (current) use of aspirin: Secondary | ICD-10-CM | POA: Diagnosis not present

## 2022-06-18 DIAGNOSIS — M4316 Spondylolisthesis, lumbar region: Secondary | ICD-10-CM | POA: Diagnosis not present

## 2022-06-19 ENCOUNTER — Ambulatory Visit: Payer: Self-pay

## 2022-06-19 DIAGNOSIS — Z87891 Personal history of nicotine dependence: Secondary | ICD-10-CM | POA: Diagnosis not present

## 2022-06-19 DIAGNOSIS — Z791 Long term (current) use of non-steroidal anti-inflammatories (NSAID): Secondary | ICD-10-CM | POA: Diagnosis not present

## 2022-06-19 DIAGNOSIS — I10 Essential (primary) hypertension: Secondary | ICD-10-CM | POA: Diagnosis not present

## 2022-06-19 DIAGNOSIS — K219 Gastro-esophageal reflux disease without esophagitis: Secondary | ICD-10-CM | POA: Diagnosis not present

## 2022-06-19 DIAGNOSIS — T8142XA Infection following a procedure, deep incisional surgical site, initial encounter: Secondary | ICD-10-CM | POA: Diagnosis not present

## 2022-06-19 DIAGNOSIS — B9689 Other specified bacterial agents as the cause of diseases classified elsewhere: Secondary | ICD-10-CM | POA: Diagnosis not present

## 2022-06-19 DIAGNOSIS — Z9181 History of falling: Secondary | ICD-10-CM | POA: Diagnosis not present

## 2022-06-19 DIAGNOSIS — M797 Fibromyalgia: Secondary | ICD-10-CM | POA: Diagnosis not present

## 2022-06-19 DIAGNOSIS — F32A Depression, unspecified: Secondary | ICD-10-CM | POA: Diagnosis not present

## 2022-06-19 DIAGNOSIS — Z792 Long term (current) use of antibiotics: Secondary | ICD-10-CM | POA: Diagnosis not present

## 2022-06-19 DIAGNOSIS — Z7982 Long term (current) use of aspirin: Secondary | ICD-10-CM | POA: Diagnosis not present

## 2022-06-19 DIAGNOSIS — Z79891 Long term (current) use of opiate analgesic: Secondary | ICD-10-CM | POA: Diagnosis not present

## 2022-06-19 DIAGNOSIS — Z452 Encounter for adjustment and management of vascular access device: Secondary | ICD-10-CM | POA: Diagnosis not present

## 2022-06-19 DIAGNOSIS — T8132XA Disruption of internal operation (surgical) wound, not elsewhere classified, initial encounter: Secondary | ICD-10-CM | POA: Diagnosis not present

## 2022-06-19 DIAGNOSIS — M4316 Spondylolisthesis, lumbar region: Secondary | ICD-10-CM | POA: Diagnosis not present

## 2022-06-19 DIAGNOSIS — Z87440 Personal history of urinary (tract) infections: Secondary | ICD-10-CM | POA: Diagnosis not present

## 2022-06-20 ENCOUNTER — Ambulatory Visit: Payer: Self-pay

## 2022-06-20 ENCOUNTER — Telehealth: Payer: Self-pay

## 2022-06-20 DIAGNOSIS — M542 Cervicalgia: Secondary | ICD-10-CM | POA: Diagnosis not present

## 2022-06-20 DIAGNOSIS — M62838 Other muscle spasm: Secondary | ICD-10-CM | POA: Diagnosis not present

## 2022-06-20 DIAGNOSIS — G8929 Other chronic pain: Secondary | ICD-10-CM | POA: Diagnosis not present

## 2022-06-20 DIAGNOSIS — Z79899 Other long term (current) drug therapy: Secondary | ICD-10-CM | POA: Diagnosis not present

## 2022-06-20 DIAGNOSIS — M25561 Pain in right knee: Secondary | ICD-10-CM | POA: Diagnosis not present

## 2022-06-20 DIAGNOSIS — M545 Low back pain, unspecified: Secondary | ICD-10-CM | POA: Diagnosis not present

## 2022-06-20 DIAGNOSIS — M25562 Pain in left knee: Secondary | ICD-10-CM | POA: Diagnosis not present

## 2022-06-20 DIAGNOSIS — M797 Fibromyalgia: Secondary | ICD-10-CM | POA: Diagnosis not present

## 2022-06-20 NOTE — Telephone Encounter (Signed)
This is what Dr. Thedore Mins documented in her note from 9/8: "Does not recall being on Bactrim(sulfa allergy listed, pt does not think she has taken sulfa drugs).  Allergy was added on 5/13 while she was on Keflex.  She has side effect of hyperpigmentation.  I suspect that Bactrim allergy may have been a Keflex allergy."  Patient seemed willing at that time to start Bactrim. She unfortunately does not have many oral options given her Enterobacter infection. Only other appropriate oral suppression option would be levofloxacin which has potential for more concerning side effects. Dr. Thedore Mins, let me know your thoughts, but sounds like she should be able to continue taking Bactrim unless she reports a new allergic reaction in the last week. Thanks [place]!

## 2022-06-20 NOTE — Telephone Encounter (Signed)
Per Nurse Case Manager patient reports allergy to sulfa. Per nurse reaction is unknown but patient is requesting another antibiotic. Routing message to Dr. Thedore Mins for advise will include pharmD as well. [name]

## 2022-06-21 DIAGNOSIS — B9689 Other specified bacterial agents as the cause of diseases classified elsewhere: Secondary | ICD-10-CM | POA: Diagnosis not present

## 2022-06-21 DIAGNOSIS — M797 Fibromyalgia: Secondary | ICD-10-CM | POA: Diagnosis not present

## 2022-06-21 DIAGNOSIS — Z87891 Personal history of nicotine dependence: Secondary | ICD-10-CM | POA: Diagnosis not present

## 2022-06-21 DIAGNOSIS — Z87440 Personal history of urinary (tract) infections: Secondary | ICD-10-CM | POA: Diagnosis not present

## 2022-06-21 DIAGNOSIS — Z452 Encounter for adjustment and management of vascular access device: Secondary | ICD-10-CM | POA: Diagnosis not present

## 2022-06-21 DIAGNOSIS — Z791 Long term (current) use of non-steroidal anti-inflammatories (NSAID): Secondary | ICD-10-CM | POA: Diagnosis not present

## 2022-06-21 DIAGNOSIS — Z9181 History of falling: Secondary | ICD-10-CM | POA: Diagnosis not present

## 2022-06-21 DIAGNOSIS — I10 Essential (primary) hypertension: Secondary | ICD-10-CM | POA: Diagnosis not present

## 2022-06-21 DIAGNOSIS — K219 Gastro-esophageal reflux disease without esophagitis: Secondary | ICD-10-CM | POA: Diagnosis not present

## 2022-06-21 DIAGNOSIS — F32A Depression, unspecified: Secondary | ICD-10-CM | POA: Diagnosis not present

## 2022-06-21 DIAGNOSIS — Z79891 Long term (current) use of opiate analgesic: Secondary | ICD-10-CM | POA: Diagnosis not present

## 2022-06-21 DIAGNOSIS — M4316 Spondylolisthesis, lumbar region: Secondary | ICD-10-CM | POA: Diagnosis not present

## 2022-06-21 DIAGNOSIS — T8132XA Disruption of internal operation (surgical) wound, not elsewhere classified, initial encounter: Secondary | ICD-10-CM | POA: Diagnosis not present

## 2022-06-21 DIAGNOSIS — Z792 Long term (current) use of antibiotics: Secondary | ICD-10-CM | POA: Diagnosis not present

## 2022-06-21 DIAGNOSIS — T8142XA Infection following a procedure, deep incisional surgical site, initial encounter: Secondary | ICD-10-CM | POA: Diagnosis not present

## 2022-06-21 DIAGNOSIS — Z7982 Long term (current) use of aspirin: Secondary | ICD-10-CM | POA: Diagnosis not present

## 2022-06-24 DIAGNOSIS — Z87440 Personal history of urinary (tract) infections: Secondary | ICD-10-CM | POA: Diagnosis not present

## 2022-06-24 DIAGNOSIS — T8132XA Disruption of internal operation (surgical) wound, not elsewhere classified, initial encounter: Secondary | ICD-10-CM | POA: Diagnosis not present

## 2022-06-24 DIAGNOSIS — F32A Depression, unspecified: Secondary | ICD-10-CM | POA: Diagnosis not present

## 2022-06-24 DIAGNOSIS — M4316 Spondylolisthesis, lumbar region: Secondary | ICD-10-CM | POA: Diagnosis not present

## 2022-06-24 DIAGNOSIS — Z87891 Personal history of nicotine dependence: Secondary | ICD-10-CM | POA: Diagnosis not present

## 2022-06-24 DIAGNOSIS — Z9181 History of falling: Secondary | ICD-10-CM | POA: Diagnosis not present

## 2022-06-24 DIAGNOSIS — I10 Essential (primary) hypertension: Secondary | ICD-10-CM | POA: Diagnosis not present

## 2022-06-24 DIAGNOSIS — B9689 Other specified bacterial agents as the cause of diseases classified elsewhere: Secondary | ICD-10-CM | POA: Diagnosis not present

## 2022-06-24 DIAGNOSIS — K219 Gastro-esophageal reflux disease without esophagitis: Secondary | ICD-10-CM | POA: Diagnosis not present

## 2022-06-24 DIAGNOSIS — Z7982 Long term (current) use of aspirin: Secondary | ICD-10-CM | POA: Diagnosis not present

## 2022-06-24 DIAGNOSIS — Z452 Encounter for adjustment and management of vascular access device: Secondary | ICD-10-CM | POA: Diagnosis not present

## 2022-06-24 DIAGNOSIS — M797 Fibromyalgia: Secondary | ICD-10-CM | POA: Diagnosis not present

## 2022-06-24 DIAGNOSIS — Z791 Long term (current) use of non-steroidal anti-inflammatories (NSAID): Secondary | ICD-10-CM | POA: Diagnosis not present

## 2022-06-24 DIAGNOSIS — Z792 Long term (current) use of antibiotics: Secondary | ICD-10-CM | POA: Diagnosis not present

## 2022-06-24 DIAGNOSIS — Z79891 Long term (current) use of opiate analgesic: Secondary | ICD-10-CM | POA: Diagnosis not present

## 2022-06-24 DIAGNOSIS — T8142XA Infection following a procedure, deep incisional surgical site, initial encounter: Secondary | ICD-10-CM | POA: Diagnosis not present

## 2022-06-26 DIAGNOSIS — Z79891 Long term (current) use of opiate analgesic: Secondary | ICD-10-CM | POA: Diagnosis not present

## 2022-06-26 DIAGNOSIS — Z791 Long term (current) use of non-steroidal anti-inflammatories (NSAID): Secondary | ICD-10-CM | POA: Diagnosis not present

## 2022-06-26 DIAGNOSIS — Z792 Long term (current) use of antibiotics: Secondary | ICD-10-CM | POA: Diagnosis not present

## 2022-06-26 DIAGNOSIS — Z87440 Personal history of urinary (tract) infections: Secondary | ICD-10-CM | POA: Diagnosis not present

## 2022-06-26 DIAGNOSIS — K219 Gastro-esophageal reflux disease without esophagitis: Secondary | ICD-10-CM | POA: Diagnosis not present

## 2022-06-26 DIAGNOSIS — Z452 Encounter for adjustment and management of vascular access device: Secondary | ICD-10-CM | POA: Diagnosis not present

## 2022-06-26 DIAGNOSIS — M4316 Spondylolisthesis, lumbar region: Secondary | ICD-10-CM | POA: Diagnosis not present

## 2022-06-26 DIAGNOSIS — T8142XA Infection following a procedure, deep incisional surgical site, initial encounter: Secondary | ICD-10-CM | POA: Diagnosis not present

## 2022-06-26 DIAGNOSIS — F32A Depression, unspecified: Secondary | ICD-10-CM | POA: Diagnosis not present

## 2022-06-26 DIAGNOSIS — M797 Fibromyalgia: Secondary | ICD-10-CM | POA: Diagnosis not present

## 2022-06-26 DIAGNOSIS — Z87891 Personal history of nicotine dependence: Secondary | ICD-10-CM | POA: Diagnosis not present

## 2022-06-26 DIAGNOSIS — I10 Essential (primary) hypertension: Secondary | ICD-10-CM | POA: Diagnosis not present

## 2022-06-26 DIAGNOSIS — Z9181 History of falling: Secondary | ICD-10-CM | POA: Diagnosis not present

## 2022-06-26 DIAGNOSIS — Z7982 Long term (current) use of aspirin: Secondary | ICD-10-CM | POA: Diagnosis not present

## 2022-06-26 DIAGNOSIS — B9689 Other specified bacterial agents as the cause of diseases classified elsewhere: Secondary | ICD-10-CM | POA: Diagnosis not present

## 2022-06-26 DIAGNOSIS — T8132XA Disruption of internal operation (surgical) wound, not elsewhere classified, initial encounter: Secondary | ICD-10-CM | POA: Diagnosis not present

## 2022-06-26 NOTE — Patient Outreach (Signed)
  Care Coordination   Follow Up Visit Note    Name: COLLIE KITTEL MRN: [id number] DOB: 09-21-1961  Clare Gandy is a 61 y.o. year old female who sees [place], L.Marlou Sa, MD for primary care. I  collaborated with the Infectious Disease team regarding plan for antibiotic therapy.  What matters to the patients health and wellness today?  Postoperative Infection    Goals Addressed             This Visit's Progress    Postoperative Infection       Care Coordination Interventions: Collaborated with the Infectious Disease team regarding patient concerns r/t Bactrim. Discussed information in patient's chart r/t sulfa allergy. Informed that patient does not recall taking sulfa drugs and experiencing an actual reaction but reports sulfa is listed as an allergy in her medical record.  Infectious Disease staff agreed to relay message and concerns to Dr. Candiss Norse.  Will follow up next week to discuss provider's recommendations and alternative treatment plan for oral antibiotics.        SDOH assessments and interventions completed:  No     Care Coordination Interventions Activated:  Yes  Care Coordination Interventions:  Yes, provided   Follow up plan:  Will follow up with Ms. Kilbourne within the next week.    Encounter Outcome:  Pt. Visit Completed   [facility] [phone]

## 2022-06-26 NOTE — Patient Outreach (Signed)
  Care Coordination   Initial Visit Note    Name: RIFKA RAMEY MRN: [id number] DOB: 1961-08-23  Clare Gandy is a 61 y.o. year old female who sees [place], L.Marlou Sa, MD for primary care. I spoke with  Clare Gandy by phone.  What matters to the patients health and wellness today?  Postoperative Infection    Goals Addressed             This Visit's Progress    Postoperative Infection       Care Coordination Interventions: Reviewed treatment plan r/t postoperative wound infection. Reports feeling well. Denies urgent concerns. Confirmed Home Health services with [facility]. Reports completing course of IV antibiotics on 06/02/22. PICC is still in place. Reports pending order for PICC removal. She will follow up with the Infectious Disease clinic on 06/14/22. Reviewed medications. Reports managing well. Denies current concerns r/t medication management or prescription cost. Assessed social determinant of health barriers.        SDOH assessments and interventions completed:  Yes  SDOH Interventions Today    Flowsheet Row Most Recent Value  SDOH Interventions   Food Insecurity Interventions Intervention Not Indicated  Transportation Interventions Intervention Not Indicated        Care Coordination Interventions Activated:  Yes  Care Coordination Interventions:  Yes, provided   Follow up plan: Follow up call scheduled for June 19, 2022.    Encounter Outcome:  Pt. Visit Completed   [facility] [facility] [phone]

## 2022-06-26 NOTE — Patient Instructions (Signed)
Visit Information  Thank you for allowing the Care Management team to participate in your care.  Following are the goals we discussed today:   Goals Addressed             This Visit's Progress    Postoperative Infection       Care Coordination Interventions: Reviewed treatment plan r/t postoperative wound infection. Reports feeling well. Denies urgent concerns. Confirmed Home Health services with [facility]. Reports completing course of IV antibiotics on 06/02/22. PICC is still in place. Reports pending order for PICC removal. She will follow up with the Infectious Disease clinic on 06/14/22. Reviewed medications. Reports managing well. Denies current concerns r/t medication management or prescription cost. Assessed social determinant of health barriers.        Our next appointment is by telephone on June 19, 2022 at  Please call the care guide team at [phone] if you need to cancel or reschedule your appointment.    Ms. Pfeffer verbalized understanding of the information discussed during the telephonic outreach. Declined need for mailed instructions or resources.   A member of the care management team will follow up in two months.  [facility] [phone]

## 2022-06-26 NOTE — Patient Outreach (Signed)
  Care Coordination   Follow Up Visit Note    Name: [name] MRN: [id number] DOB: 23-Oct-1960  [name] is a 61 y.o. year old female who sees [place], L.Marlou Sa, MD for primary care. I spoke with  [name] by phone.  What matters to the patients health and wellness today?  Postoperative Infection    Goals Addressed             This Visit's Progress    Postoperative Infection       Care Coordination Interventions: Reviewed treatment plan r/t postoperative wound infection.  Reports PICC has been removed. Attended follow up evaluation with the Infectious Disease team as scheduled. Expressed concerns r/t recent order for Bactrim. Reports not recalling an actual reaction to sulfa drugs. Recalls being informed by medical staff that sulfa allergy was listed in her medical record.  Per chart review, Bactrim was prescribed during Infectious Disease clinic visit on 06/14/22. Medication was prescribed for patient to take one tablet twice a day. Reports not starting the medication d/t possible allergy.  Will update the Infectious Disease clinic.        SDOH assessments and interventions completed:  No     Care Coordination Interventions Activated:  Yes  Care Coordination Interventions:  Yes, provided   Follow up plan:  Will update the Infectious Disease clinic and follow up with [name] within the next week.    Encounter Outcome:  Pt. Visit Completed   [facility] [phone]

## 2022-06-29 DIAGNOSIS — Z7982 Long term (current) use of aspirin: Secondary | ICD-10-CM | POA: Diagnosis not present

## 2022-06-29 DIAGNOSIS — F32A Depression, unspecified: Secondary | ICD-10-CM | POA: Diagnosis not present

## 2022-06-29 DIAGNOSIS — T8132XA Disruption of internal operation (surgical) wound, not elsewhere classified, initial encounter: Secondary | ICD-10-CM | POA: Diagnosis not present

## 2022-06-29 DIAGNOSIS — Z87891 Personal history of nicotine dependence: Secondary | ICD-10-CM | POA: Diagnosis not present

## 2022-06-29 DIAGNOSIS — T8142XA Infection following a procedure, deep incisional surgical site, initial encounter: Secondary | ICD-10-CM | POA: Diagnosis not present

## 2022-06-29 DIAGNOSIS — Z452 Encounter for adjustment and management of vascular access device: Secondary | ICD-10-CM | POA: Diagnosis not present

## 2022-06-29 DIAGNOSIS — M797 Fibromyalgia: Secondary | ICD-10-CM | POA: Diagnosis not present

## 2022-06-29 DIAGNOSIS — Z9181 History of falling: Secondary | ICD-10-CM | POA: Diagnosis not present

## 2022-06-29 DIAGNOSIS — Z791 Long term (current) use of non-steroidal anti-inflammatories (NSAID): Secondary | ICD-10-CM | POA: Diagnosis not present

## 2022-06-29 DIAGNOSIS — M4316 Spondylolisthesis, lumbar region: Secondary | ICD-10-CM | POA: Diagnosis not present

## 2022-06-29 DIAGNOSIS — K219 Gastro-esophageal reflux disease without esophagitis: Secondary | ICD-10-CM | POA: Diagnosis not present

## 2022-06-29 DIAGNOSIS — Z792 Long term (current) use of antibiotics: Secondary | ICD-10-CM | POA: Diagnosis not present

## 2022-06-29 DIAGNOSIS — Z87440 Personal history of urinary (tract) infections: Secondary | ICD-10-CM | POA: Diagnosis not present

## 2022-06-29 DIAGNOSIS — I10 Essential (primary) hypertension: Secondary | ICD-10-CM | POA: Diagnosis not present

## 2022-06-29 DIAGNOSIS — B9689 Other specified bacterial agents as the cause of diseases classified elsewhere: Secondary | ICD-10-CM | POA: Diagnosis not present

## 2022-06-29 DIAGNOSIS — Z79891 Long term (current) use of opiate analgesic: Secondary | ICD-10-CM | POA: Diagnosis not present

## 2022-07-01 DIAGNOSIS — K219 Gastro-esophageal reflux disease without esophagitis: Secondary | ICD-10-CM | POA: Diagnosis not present

## 2022-07-01 DIAGNOSIS — Z791 Long term (current) use of non-steroidal anti-inflammatories (NSAID): Secondary | ICD-10-CM | POA: Diagnosis not present

## 2022-07-01 DIAGNOSIS — I10 Essential (primary) hypertension: Secondary | ICD-10-CM | POA: Diagnosis not present

## 2022-07-01 DIAGNOSIS — F32A Depression, unspecified: Secondary | ICD-10-CM | POA: Diagnosis not present

## 2022-07-01 DIAGNOSIS — M4316 Spondylolisthesis, lumbar region: Secondary | ICD-10-CM | POA: Diagnosis not present

## 2022-07-01 DIAGNOSIS — Z452 Encounter for adjustment and management of vascular access device: Secondary | ICD-10-CM | POA: Diagnosis not present

## 2022-07-01 DIAGNOSIS — T8142XA Infection following a procedure, deep incisional surgical site, initial encounter: Secondary | ICD-10-CM | POA: Diagnosis not present

## 2022-07-01 DIAGNOSIS — Z79891 Long term (current) use of opiate analgesic: Secondary | ICD-10-CM | POA: Diagnosis not present

## 2022-07-01 DIAGNOSIS — Z792 Long term (current) use of antibiotics: Secondary | ICD-10-CM | POA: Diagnosis not present

## 2022-07-01 DIAGNOSIS — Z9181 History of falling: Secondary | ICD-10-CM | POA: Diagnosis not present

## 2022-07-01 DIAGNOSIS — Z87440 Personal history of urinary (tract) infections: Secondary | ICD-10-CM | POA: Diagnosis not present

## 2022-07-01 DIAGNOSIS — Z7982 Long term (current) use of aspirin: Secondary | ICD-10-CM | POA: Diagnosis not present

## 2022-07-01 DIAGNOSIS — M797 Fibromyalgia: Secondary | ICD-10-CM | POA: Diagnosis not present

## 2022-07-01 DIAGNOSIS — T8132XA Disruption of internal operation (surgical) wound, not elsewhere classified, initial encounter: Secondary | ICD-10-CM | POA: Diagnosis not present

## 2022-07-01 DIAGNOSIS — B9689 Other specified bacterial agents as the cause of diseases classified elsewhere: Secondary | ICD-10-CM | POA: Diagnosis not present

## 2022-07-01 DIAGNOSIS — Z87891 Personal history of nicotine dependence: Secondary | ICD-10-CM | POA: Diagnosis not present

## 2022-07-03 DIAGNOSIS — G4700 Insomnia, unspecified: Secondary | ICD-10-CM | POA: Diagnosis not present

## 2022-07-03 DIAGNOSIS — K219 Gastro-esophageal reflux disease without esophagitis: Secondary | ICD-10-CM | POA: Diagnosis not present

## 2022-07-03 DIAGNOSIS — J45909 Unspecified asthma, uncomplicated: Secondary | ICD-10-CM | POA: Diagnosis not present

## 2022-07-03 DIAGNOSIS — Z791 Long term (current) use of non-steroidal anti-inflammatories (NSAID): Secondary | ICD-10-CM | POA: Diagnosis not present

## 2022-07-03 DIAGNOSIS — M4316 Spondylolisthesis, lumbar region: Secondary | ICD-10-CM | POA: Diagnosis not present

## 2022-07-03 DIAGNOSIS — K589 Irritable bowel syndrome without diarrhea: Secondary | ICD-10-CM | POA: Diagnosis not present

## 2022-07-03 DIAGNOSIS — Z7951 Long term (current) use of inhaled steroids: Secondary | ICD-10-CM | POA: Diagnosis not present

## 2022-07-03 DIAGNOSIS — F32A Depression, unspecified: Secondary | ICD-10-CM | POA: Diagnosis not present

## 2022-07-03 DIAGNOSIS — T8142XA Infection following a procedure, deep incisional surgical site, initial encounter: Secondary | ICD-10-CM | POA: Diagnosis not present

## 2022-07-03 DIAGNOSIS — Z87440 Personal history of urinary (tract) infections: Secondary | ICD-10-CM | POA: Diagnosis not present

## 2022-07-03 DIAGNOSIS — Z9181 History of falling: Secondary | ICD-10-CM | POA: Diagnosis not present

## 2022-07-03 DIAGNOSIS — D649 Anemia, unspecified: Secondary | ICD-10-CM | POA: Diagnosis not present

## 2022-07-03 DIAGNOSIS — Z87891 Personal history of nicotine dependence: Secondary | ICD-10-CM | POA: Diagnosis not present

## 2022-07-03 DIAGNOSIS — M797 Fibromyalgia: Secondary | ICD-10-CM | POA: Diagnosis not present

## 2022-07-03 DIAGNOSIS — Z7982 Long term (current) use of aspirin: Secondary | ICD-10-CM | POA: Diagnosis not present

## 2022-07-03 DIAGNOSIS — I10 Essential (primary) hypertension: Secondary | ICD-10-CM | POA: Diagnosis not present

## 2022-07-03 DIAGNOSIS — E039 Hypothyroidism, unspecified: Secondary | ICD-10-CM | POA: Diagnosis not present

## 2022-07-03 DIAGNOSIS — B9689 Other specified bacterial agents as the cause of diseases classified elsewhere: Secondary | ICD-10-CM | POA: Diagnosis not present

## 2022-07-03 DIAGNOSIS — Z79891 Long term (current) use of opiate analgesic: Secondary | ICD-10-CM | POA: Diagnosis not present

## 2022-07-04 DIAGNOSIS — Z7982 Long term (current) use of aspirin: Secondary | ICD-10-CM | POA: Diagnosis not present

## 2022-07-04 DIAGNOSIS — K589 Irritable bowel syndrome without diarrhea: Secondary | ICD-10-CM | POA: Diagnosis not present

## 2022-07-04 DIAGNOSIS — J45909 Unspecified asthma, uncomplicated: Secondary | ICD-10-CM | POA: Diagnosis not present

## 2022-07-04 DIAGNOSIS — E039 Hypothyroidism, unspecified: Secondary | ICD-10-CM | POA: Diagnosis not present

## 2022-07-04 DIAGNOSIS — Z791 Long term (current) use of non-steroidal anti-inflammatories (NSAID): Secondary | ICD-10-CM | POA: Diagnosis not present

## 2022-07-04 DIAGNOSIS — F32A Depression, unspecified: Secondary | ICD-10-CM | POA: Diagnosis not present

## 2022-07-04 DIAGNOSIS — Z87440 Personal history of urinary (tract) infections: Secondary | ICD-10-CM | POA: Diagnosis not present

## 2022-07-04 DIAGNOSIS — I10 Essential (primary) hypertension: Secondary | ICD-10-CM | POA: Diagnosis not present

## 2022-07-04 DIAGNOSIS — M797 Fibromyalgia: Secondary | ICD-10-CM | POA: Diagnosis not present

## 2022-07-04 DIAGNOSIS — T8142XA Infection following a procedure, deep incisional surgical site, initial encounter: Secondary | ICD-10-CM | POA: Diagnosis not present

## 2022-07-04 DIAGNOSIS — K219 Gastro-esophageal reflux disease without esophagitis: Secondary | ICD-10-CM | POA: Diagnosis not present

## 2022-07-04 DIAGNOSIS — Z87891 Personal history of nicotine dependence: Secondary | ICD-10-CM | POA: Diagnosis not present

## 2022-07-04 DIAGNOSIS — B9689 Other specified bacterial agents as the cause of diseases classified elsewhere: Secondary | ICD-10-CM | POA: Diagnosis not present

## 2022-07-04 DIAGNOSIS — Z7951 Long term (current) use of inhaled steroids: Secondary | ICD-10-CM | POA: Diagnosis not present

## 2022-07-04 DIAGNOSIS — Z9181 History of falling: Secondary | ICD-10-CM | POA: Diagnosis not present

## 2022-07-04 DIAGNOSIS — Z79891 Long term (current) use of opiate analgesic: Secondary | ICD-10-CM | POA: Diagnosis not present

## 2022-07-04 DIAGNOSIS — G4700 Insomnia, unspecified: Secondary | ICD-10-CM | POA: Diagnosis not present

## 2022-07-04 DIAGNOSIS — D649 Anemia, unspecified: Secondary | ICD-10-CM | POA: Diagnosis not present

## 2022-07-04 DIAGNOSIS — M4316 Spondylolisthesis, lumbar region: Secondary | ICD-10-CM | POA: Diagnosis not present

## 2022-07-05 DIAGNOSIS — M4316 Spondylolisthesis, lumbar region: Secondary | ICD-10-CM | POA: Diagnosis not present

## 2022-07-05 DIAGNOSIS — K219 Gastro-esophageal reflux disease without esophagitis: Secondary | ICD-10-CM | POA: Diagnosis not present

## 2022-07-05 DIAGNOSIS — Z791 Long term (current) use of non-steroidal anti-inflammatories (NSAID): Secondary | ICD-10-CM | POA: Diagnosis not present

## 2022-07-05 DIAGNOSIS — G4700 Insomnia, unspecified: Secondary | ICD-10-CM | POA: Diagnosis not present

## 2022-07-05 DIAGNOSIS — T8142XA Infection following a procedure, deep incisional surgical site, initial encounter: Secondary | ICD-10-CM | POA: Diagnosis not present

## 2022-07-05 DIAGNOSIS — Z79891 Long term (current) use of opiate analgesic: Secondary | ICD-10-CM | POA: Diagnosis not present

## 2022-07-05 DIAGNOSIS — I10 Essential (primary) hypertension: Secondary | ICD-10-CM | POA: Diagnosis not present

## 2022-07-05 DIAGNOSIS — Z87440 Personal history of urinary (tract) infections: Secondary | ICD-10-CM | POA: Diagnosis not present

## 2022-07-05 DIAGNOSIS — Z7982 Long term (current) use of aspirin: Secondary | ICD-10-CM | POA: Diagnosis not present

## 2022-07-05 DIAGNOSIS — M797 Fibromyalgia: Secondary | ICD-10-CM | POA: Diagnosis not present

## 2022-07-05 DIAGNOSIS — Z7951 Long term (current) use of inhaled steroids: Secondary | ICD-10-CM | POA: Diagnosis not present

## 2022-07-05 DIAGNOSIS — Z87891 Personal history of nicotine dependence: Secondary | ICD-10-CM | POA: Diagnosis not present

## 2022-07-05 DIAGNOSIS — K589 Irritable bowel syndrome without diarrhea: Secondary | ICD-10-CM | POA: Diagnosis not present

## 2022-07-05 DIAGNOSIS — B9689 Other specified bacterial agents as the cause of diseases classified elsewhere: Secondary | ICD-10-CM | POA: Diagnosis not present

## 2022-07-05 DIAGNOSIS — Z9181 History of falling: Secondary | ICD-10-CM | POA: Diagnosis not present

## 2022-07-05 DIAGNOSIS — J45909 Unspecified asthma, uncomplicated: Secondary | ICD-10-CM | POA: Diagnosis not present

## 2022-07-05 DIAGNOSIS — E039 Hypothyroidism, unspecified: Secondary | ICD-10-CM | POA: Diagnosis not present

## 2022-07-05 DIAGNOSIS — F32A Depression, unspecified: Secondary | ICD-10-CM | POA: Diagnosis not present

## 2022-07-05 DIAGNOSIS — D649 Anemia, unspecified: Secondary | ICD-10-CM | POA: Diagnosis not present

## 2022-07-08 DIAGNOSIS — Z791 Long term (current) use of non-steroidal anti-inflammatories (NSAID): Secondary | ICD-10-CM | POA: Diagnosis not present

## 2022-07-08 DIAGNOSIS — K219 Gastro-esophageal reflux disease without esophagitis: Secondary | ICD-10-CM | POA: Diagnosis not present

## 2022-07-08 DIAGNOSIS — M797 Fibromyalgia: Secondary | ICD-10-CM | POA: Diagnosis not present

## 2022-07-08 DIAGNOSIS — Z79891 Long term (current) use of opiate analgesic: Secondary | ICD-10-CM | POA: Diagnosis not present

## 2022-07-08 DIAGNOSIS — T8142XA Infection following a procedure, deep incisional surgical site, initial encounter: Secondary | ICD-10-CM | POA: Diagnosis not present

## 2022-07-08 DIAGNOSIS — I10 Essential (primary) hypertension: Secondary | ICD-10-CM | POA: Diagnosis not present

## 2022-07-08 DIAGNOSIS — D649 Anemia, unspecified: Secondary | ICD-10-CM | POA: Diagnosis not present

## 2022-07-08 DIAGNOSIS — F32A Depression, unspecified: Secondary | ICD-10-CM | POA: Diagnosis not present

## 2022-07-08 DIAGNOSIS — Z87440 Personal history of urinary (tract) infections: Secondary | ICD-10-CM | POA: Diagnosis not present

## 2022-07-08 DIAGNOSIS — E039 Hypothyroidism, unspecified: Secondary | ICD-10-CM | POA: Diagnosis not present

## 2022-07-08 DIAGNOSIS — M4316 Spondylolisthesis, lumbar region: Secondary | ICD-10-CM | POA: Diagnosis not present

## 2022-07-08 DIAGNOSIS — Z87891 Personal history of nicotine dependence: Secondary | ICD-10-CM | POA: Diagnosis not present

## 2022-07-08 DIAGNOSIS — J45909 Unspecified asthma, uncomplicated: Secondary | ICD-10-CM | POA: Diagnosis not present

## 2022-07-08 DIAGNOSIS — Z7982 Long term (current) use of aspirin: Secondary | ICD-10-CM | POA: Diagnosis not present

## 2022-07-08 DIAGNOSIS — G4700 Insomnia, unspecified: Secondary | ICD-10-CM | POA: Diagnosis not present

## 2022-07-08 DIAGNOSIS — B9689 Other specified bacterial agents as the cause of diseases classified elsewhere: Secondary | ICD-10-CM | POA: Diagnosis not present

## 2022-07-08 DIAGNOSIS — K589 Irritable bowel syndrome without diarrhea: Secondary | ICD-10-CM | POA: Diagnosis not present

## 2022-07-08 DIAGNOSIS — Z7951 Long term (current) use of inhaled steroids: Secondary | ICD-10-CM | POA: Diagnosis not present

## 2022-07-08 DIAGNOSIS — Z9181 History of falling: Secondary | ICD-10-CM | POA: Diagnosis not present

## 2022-07-09 DIAGNOSIS — M4316 Spondylolisthesis, lumbar region: Secondary | ICD-10-CM | POA: Diagnosis not present

## 2022-07-09 DIAGNOSIS — B9689 Other specified bacterial agents as the cause of diseases classified elsewhere: Secondary | ICD-10-CM | POA: Diagnosis not present

## 2022-07-09 DIAGNOSIS — G4700 Insomnia, unspecified: Secondary | ICD-10-CM | POA: Diagnosis not present

## 2022-07-09 DIAGNOSIS — E039 Hypothyroidism, unspecified: Secondary | ICD-10-CM | POA: Diagnosis not present

## 2022-07-09 DIAGNOSIS — K219 Gastro-esophageal reflux disease without esophagitis: Secondary | ICD-10-CM | POA: Diagnosis not present

## 2022-07-09 DIAGNOSIS — Z87891 Personal history of nicotine dependence: Secondary | ICD-10-CM | POA: Diagnosis not present

## 2022-07-09 DIAGNOSIS — T8142XA Infection following a procedure, deep incisional surgical site, initial encounter: Secondary | ICD-10-CM | POA: Diagnosis not present

## 2022-07-09 DIAGNOSIS — K589 Irritable bowel syndrome without diarrhea: Secondary | ICD-10-CM | POA: Diagnosis not present

## 2022-07-09 DIAGNOSIS — Z7951 Long term (current) use of inhaled steroids: Secondary | ICD-10-CM | POA: Diagnosis not present

## 2022-07-09 DIAGNOSIS — J45909 Unspecified asthma, uncomplicated: Secondary | ICD-10-CM | POA: Diagnosis not present

## 2022-07-09 DIAGNOSIS — Z791 Long term (current) use of non-steroidal anti-inflammatories (NSAID): Secondary | ICD-10-CM | POA: Diagnosis not present

## 2022-07-09 DIAGNOSIS — Z7982 Long term (current) use of aspirin: Secondary | ICD-10-CM | POA: Diagnosis not present

## 2022-07-09 DIAGNOSIS — I10 Essential (primary) hypertension: Secondary | ICD-10-CM | POA: Diagnosis not present

## 2022-07-09 DIAGNOSIS — D649 Anemia, unspecified: Secondary | ICD-10-CM | POA: Diagnosis not present

## 2022-07-09 DIAGNOSIS — F32A Depression, unspecified: Secondary | ICD-10-CM | POA: Diagnosis not present

## 2022-07-09 DIAGNOSIS — M797 Fibromyalgia: Secondary | ICD-10-CM | POA: Diagnosis not present

## 2022-07-09 DIAGNOSIS — Z9181 History of falling: Secondary | ICD-10-CM | POA: Diagnosis not present

## 2022-07-09 DIAGNOSIS — Z79891 Long term (current) use of opiate analgesic: Secondary | ICD-10-CM | POA: Diagnosis not present

## 2022-07-09 DIAGNOSIS — Z87440 Personal history of urinary (tract) infections: Secondary | ICD-10-CM | POA: Diagnosis not present

## 2022-07-10 DIAGNOSIS — G4700 Insomnia, unspecified: Secondary | ICD-10-CM | POA: Diagnosis not present

## 2022-07-10 DIAGNOSIS — Z7982 Long term (current) use of aspirin: Secondary | ICD-10-CM | POA: Diagnosis not present

## 2022-07-10 DIAGNOSIS — D649 Anemia, unspecified: Secondary | ICD-10-CM | POA: Diagnosis not present

## 2022-07-10 DIAGNOSIS — Z7951 Long term (current) use of inhaled steroids: Secondary | ICD-10-CM | POA: Diagnosis not present

## 2022-07-10 DIAGNOSIS — T8142XA Infection following a procedure, deep incisional surgical site, initial encounter: Secondary | ICD-10-CM | POA: Diagnosis not present

## 2022-07-10 DIAGNOSIS — B9689 Other specified bacterial agents as the cause of diseases classified elsewhere: Secondary | ICD-10-CM | POA: Diagnosis not present

## 2022-07-10 DIAGNOSIS — K589 Irritable bowel syndrome without diarrhea: Secondary | ICD-10-CM | POA: Diagnosis not present

## 2022-07-10 DIAGNOSIS — Z791 Long term (current) use of non-steroidal anti-inflammatories (NSAID): Secondary | ICD-10-CM | POA: Diagnosis not present

## 2022-07-10 DIAGNOSIS — M797 Fibromyalgia: Secondary | ICD-10-CM | POA: Diagnosis not present

## 2022-07-10 DIAGNOSIS — Z79891 Long term (current) use of opiate analgesic: Secondary | ICD-10-CM | POA: Diagnosis not present

## 2022-07-10 DIAGNOSIS — F32A Depression, unspecified: Secondary | ICD-10-CM | POA: Diagnosis not present

## 2022-07-10 DIAGNOSIS — I10 Essential (primary) hypertension: Secondary | ICD-10-CM | POA: Diagnosis not present

## 2022-07-10 DIAGNOSIS — K219 Gastro-esophageal reflux disease without esophagitis: Secondary | ICD-10-CM | POA: Diagnosis not present

## 2022-07-10 DIAGNOSIS — Z87891 Personal history of nicotine dependence: Secondary | ICD-10-CM | POA: Diagnosis not present

## 2022-07-10 DIAGNOSIS — Z87440 Personal history of urinary (tract) infections: Secondary | ICD-10-CM | POA: Diagnosis not present

## 2022-07-10 DIAGNOSIS — Z9181 History of falling: Secondary | ICD-10-CM | POA: Diagnosis not present

## 2022-07-10 DIAGNOSIS — J45909 Unspecified asthma, uncomplicated: Secondary | ICD-10-CM | POA: Diagnosis not present

## 2022-07-10 DIAGNOSIS — M4316 Spondylolisthesis, lumbar region: Secondary | ICD-10-CM | POA: Diagnosis not present

## 2022-07-10 DIAGNOSIS — E039 Hypothyroidism, unspecified: Secondary | ICD-10-CM | POA: Diagnosis not present

## 2022-07-12 DIAGNOSIS — D649 Anemia, unspecified: Secondary | ICD-10-CM | POA: Diagnosis not present

## 2022-07-12 DIAGNOSIS — Z87891 Personal history of nicotine dependence: Secondary | ICD-10-CM | POA: Diagnosis not present

## 2022-07-12 DIAGNOSIS — J45909 Unspecified asthma, uncomplicated: Secondary | ICD-10-CM | POA: Diagnosis not present

## 2022-07-12 DIAGNOSIS — K219 Gastro-esophageal reflux disease without esophagitis: Secondary | ICD-10-CM | POA: Diagnosis not present

## 2022-07-12 DIAGNOSIS — Z87440 Personal history of urinary (tract) infections: Secondary | ICD-10-CM | POA: Diagnosis not present

## 2022-07-12 DIAGNOSIS — M4316 Spondylolisthesis, lumbar region: Secondary | ICD-10-CM | POA: Diagnosis not present

## 2022-07-12 DIAGNOSIS — T8142XA Infection following a procedure, deep incisional surgical site, initial encounter: Secondary | ICD-10-CM | POA: Diagnosis not present

## 2022-07-12 DIAGNOSIS — G4700 Insomnia, unspecified: Secondary | ICD-10-CM | POA: Diagnosis not present

## 2022-07-12 DIAGNOSIS — F32A Depression, unspecified: Secondary | ICD-10-CM | POA: Diagnosis not present

## 2022-07-12 DIAGNOSIS — Z791 Long term (current) use of non-steroidal anti-inflammatories (NSAID): Secondary | ICD-10-CM | POA: Diagnosis not present

## 2022-07-12 DIAGNOSIS — I10 Essential (primary) hypertension: Secondary | ICD-10-CM | POA: Diagnosis not present

## 2022-07-12 DIAGNOSIS — E039 Hypothyroidism, unspecified: Secondary | ICD-10-CM | POA: Diagnosis not present

## 2022-07-12 DIAGNOSIS — B9689 Other specified bacterial agents as the cause of diseases classified elsewhere: Secondary | ICD-10-CM | POA: Diagnosis not present

## 2022-07-12 DIAGNOSIS — Z9181 History of falling: Secondary | ICD-10-CM | POA: Diagnosis not present

## 2022-07-12 DIAGNOSIS — K589 Irritable bowel syndrome without diarrhea: Secondary | ICD-10-CM | POA: Diagnosis not present

## 2022-07-12 DIAGNOSIS — M797 Fibromyalgia: Secondary | ICD-10-CM | POA: Diagnosis not present

## 2022-07-12 DIAGNOSIS — Z7982 Long term (current) use of aspirin: Secondary | ICD-10-CM | POA: Diagnosis not present

## 2022-07-12 DIAGNOSIS — Z7951 Long term (current) use of inhaled steroids: Secondary | ICD-10-CM | POA: Diagnosis not present

## 2022-07-12 DIAGNOSIS — Z79891 Long term (current) use of opiate analgesic: Secondary | ICD-10-CM | POA: Diagnosis not present

## 2022-07-13 DIAGNOSIS — M797 Fibromyalgia: Secondary | ICD-10-CM | POA: Diagnosis not present

## 2022-07-13 DIAGNOSIS — M542 Cervicalgia: Secondary | ICD-10-CM | POA: Diagnosis not present

## 2022-07-13 DIAGNOSIS — M62838 Other muscle spasm: Secondary | ICD-10-CM | POA: Diagnosis not present

## 2022-07-13 DIAGNOSIS — F32A Depression, unspecified: Secondary | ICD-10-CM | POA: Diagnosis not present

## 2022-07-13 DIAGNOSIS — Z79899 Other long term (current) drug therapy: Secondary | ICD-10-CM | POA: Diagnosis not present

## 2022-07-13 DIAGNOSIS — M25562 Pain in left knee: Secondary | ICD-10-CM | POA: Diagnosis not present

## 2022-07-13 DIAGNOSIS — Z9181 History of falling: Secondary | ICD-10-CM | POA: Diagnosis not present

## 2022-07-13 DIAGNOSIS — Z23 Encounter for immunization: Secondary | ICD-10-CM | POA: Diagnosis not present

## 2022-07-13 DIAGNOSIS — M545 Low back pain, unspecified: Secondary | ICD-10-CM | POA: Diagnosis not present

## 2022-07-13 DIAGNOSIS — G8929 Other chronic pain: Secondary | ICD-10-CM | POA: Diagnosis not present

## 2022-07-13 DIAGNOSIS — M25561 Pain in right knee: Secondary | ICD-10-CM | POA: Diagnosis not present

## 2022-07-15 ENCOUNTER — Ambulatory Visit: Payer: Medicare Other | Admitting: Internal Medicine

## 2022-07-15 DIAGNOSIS — F32A Depression, unspecified: Secondary | ICD-10-CM | POA: Diagnosis not present

## 2022-07-15 DIAGNOSIS — Z7982 Long term (current) use of aspirin: Secondary | ICD-10-CM | POA: Diagnosis not present

## 2022-07-15 DIAGNOSIS — J45909 Unspecified asthma, uncomplicated: Secondary | ICD-10-CM | POA: Diagnosis not present

## 2022-07-15 DIAGNOSIS — Z87440 Personal history of urinary (tract) infections: Secondary | ICD-10-CM | POA: Diagnosis not present

## 2022-07-15 DIAGNOSIS — D649 Anemia, unspecified: Secondary | ICD-10-CM | POA: Diagnosis not present

## 2022-07-15 DIAGNOSIS — E039 Hypothyroidism, unspecified: Secondary | ICD-10-CM | POA: Diagnosis not present

## 2022-07-15 DIAGNOSIS — K219 Gastro-esophageal reflux disease without esophagitis: Secondary | ICD-10-CM | POA: Diagnosis not present

## 2022-07-15 DIAGNOSIS — Z7951 Long term (current) use of inhaled steroids: Secondary | ICD-10-CM | POA: Diagnosis not present

## 2022-07-15 DIAGNOSIS — M4316 Spondylolisthesis, lumbar region: Secondary | ICD-10-CM | POA: Diagnosis not present

## 2022-07-15 DIAGNOSIS — T8142XA Infection following a procedure, deep incisional surgical site, initial encounter: Secondary | ICD-10-CM | POA: Diagnosis not present

## 2022-07-15 DIAGNOSIS — Z791 Long term (current) use of non-steroidal anti-inflammatories (NSAID): Secondary | ICD-10-CM | POA: Diagnosis not present

## 2022-07-15 DIAGNOSIS — Z79891 Long term (current) use of opiate analgesic: Secondary | ICD-10-CM | POA: Diagnosis not present

## 2022-07-15 DIAGNOSIS — K589 Irritable bowel syndrome without diarrhea: Secondary | ICD-10-CM | POA: Diagnosis not present

## 2022-07-15 DIAGNOSIS — Z87891 Personal history of nicotine dependence: Secondary | ICD-10-CM | POA: Diagnosis not present

## 2022-07-15 DIAGNOSIS — B9689 Other specified bacterial agents as the cause of diseases classified elsewhere: Secondary | ICD-10-CM | POA: Diagnosis not present

## 2022-07-15 DIAGNOSIS — Z9181 History of falling: Secondary | ICD-10-CM | POA: Diagnosis not present

## 2022-07-15 DIAGNOSIS — I10 Essential (primary) hypertension: Secondary | ICD-10-CM | POA: Diagnosis not present

## 2022-07-15 DIAGNOSIS — G4700 Insomnia, unspecified: Secondary | ICD-10-CM | POA: Diagnosis not present

## 2022-07-15 DIAGNOSIS — M797 Fibromyalgia: Secondary | ICD-10-CM | POA: Diagnosis not present

## 2022-07-16 DIAGNOSIS — I10 Essential (primary) hypertension: Secondary | ICD-10-CM | POA: Diagnosis not present

## 2022-07-16 DIAGNOSIS — Z7951 Long term (current) use of inhaled steroids: Secondary | ICD-10-CM | POA: Diagnosis not present

## 2022-07-16 DIAGNOSIS — M4316 Spondylolisthesis, lumbar region: Secondary | ICD-10-CM | POA: Diagnosis not present

## 2022-07-16 DIAGNOSIS — Z7982 Long term (current) use of aspirin: Secondary | ICD-10-CM | POA: Diagnosis not present

## 2022-07-16 DIAGNOSIS — Z87440 Personal history of urinary (tract) infections: Secondary | ICD-10-CM | POA: Diagnosis not present

## 2022-07-16 DIAGNOSIS — Z79891 Long term (current) use of opiate analgesic: Secondary | ICD-10-CM | POA: Diagnosis not present

## 2022-07-16 DIAGNOSIS — J45909 Unspecified asthma, uncomplicated: Secondary | ICD-10-CM | POA: Diagnosis not present

## 2022-07-16 DIAGNOSIS — G4700 Insomnia, unspecified: Secondary | ICD-10-CM | POA: Diagnosis not present

## 2022-07-16 DIAGNOSIS — T8142XA Infection following a procedure, deep incisional surgical site, initial encounter: Secondary | ICD-10-CM | POA: Diagnosis not present

## 2022-07-16 DIAGNOSIS — Z791 Long term (current) use of non-steroidal anti-inflammatories (NSAID): Secondary | ICD-10-CM | POA: Diagnosis not present

## 2022-07-16 DIAGNOSIS — Z87891 Personal history of nicotine dependence: Secondary | ICD-10-CM | POA: Diagnosis not present

## 2022-07-16 DIAGNOSIS — E039 Hypothyroidism, unspecified: Secondary | ICD-10-CM | POA: Diagnosis not present

## 2022-07-16 DIAGNOSIS — B9689 Other specified bacterial agents as the cause of diseases classified elsewhere: Secondary | ICD-10-CM | POA: Diagnosis not present

## 2022-07-16 DIAGNOSIS — K589 Irritable bowel syndrome without diarrhea: Secondary | ICD-10-CM | POA: Diagnosis not present

## 2022-07-16 DIAGNOSIS — Z9181 History of falling: Secondary | ICD-10-CM | POA: Diagnosis not present

## 2022-07-16 DIAGNOSIS — D649 Anemia, unspecified: Secondary | ICD-10-CM | POA: Diagnosis not present

## 2022-07-16 DIAGNOSIS — M797 Fibromyalgia: Secondary | ICD-10-CM | POA: Diagnosis not present

## 2022-07-16 DIAGNOSIS — F32A Depression, unspecified: Secondary | ICD-10-CM | POA: Diagnosis not present

## 2022-07-16 DIAGNOSIS — K219 Gastro-esophageal reflux disease without esophagitis: Secondary | ICD-10-CM | POA: Diagnosis not present

## 2022-07-17 DIAGNOSIS — M797 Fibromyalgia: Secondary | ICD-10-CM | POA: Diagnosis not present

## 2022-07-17 DIAGNOSIS — F32A Depression, unspecified: Secondary | ICD-10-CM | POA: Diagnosis not present

## 2022-07-17 DIAGNOSIS — E039 Hypothyroidism, unspecified: Secondary | ICD-10-CM | POA: Diagnosis not present

## 2022-07-17 DIAGNOSIS — K219 Gastro-esophageal reflux disease without esophagitis: Secondary | ICD-10-CM | POA: Diagnosis not present

## 2022-07-17 DIAGNOSIS — Z79899 Other long term (current) drug therapy: Secondary | ICD-10-CM | POA: Diagnosis not present

## 2022-07-17 DIAGNOSIS — Z7982 Long term (current) use of aspirin: Secondary | ICD-10-CM | POA: Diagnosis not present

## 2022-07-17 DIAGNOSIS — Z791 Long term (current) use of non-steroidal anti-inflammatories (NSAID): Secondary | ICD-10-CM | POA: Diagnosis not present

## 2022-07-17 DIAGNOSIS — Z87891 Personal history of nicotine dependence: Secondary | ICD-10-CM | POA: Diagnosis not present

## 2022-07-17 DIAGNOSIS — B9689 Other specified bacterial agents as the cause of diseases classified elsewhere: Secondary | ICD-10-CM | POA: Diagnosis not present

## 2022-07-17 DIAGNOSIS — G4700 Insomnia, unspecified: Secondary | ICD-10-CM | POA: Diagnosis not present

## 2022-07-17 DIAGNOSIS — Z87440 Personal history of urinary (tract) infections: Secondary | ICD-10-CM | POA: Diagnosis not present

## 2022-07-17 DIAGNOSIS — T8142XA Infection following a procedure, deep incisional surgical site, initial encounter: Secondary | ICD-10-CM | POA: Diagnosis not present

## 2022-07-17 DIAGNOSIS — D649 Anemia, unspecified: Secondary | ICD-10-CM | POA: Diagnosis not present

## 2022-07-17 DIAGNOSIS — I10 Essential (primary) hypertension: Secondary | ICD-10-CM | POA: Diagnosis not present

## 2022-07-17 DIAGNOSIS — M4316 Spondylolisthesis, lumbar region: Secondary | ICD-10-CM | POA: Diagnosis not present

## 2022-07-17 DIAGNOSIS — Z7951 Long term (current) use of inhaled steroids: Secondary | ICD-10-CM | POA: Diagnosis not present

## 2022-07-17 DIAGNOSIS — Z9181 History of falling: Secondary | ICD-10-CM | POA: Diagnosis not present

## 2022-07-17 DIAGNOSIS — K589 Irritable bowel syndrome without diarrhea: Secondary | ICD-10-CM | POA: Diagnosis not present

## 2022-07-17 DIAGNOSIS — Z79891 Long term (current) use of opiate analgesic: Secondary | ICD-10-CM | POA: Diagnosis not present

## 2022-07-17 DIAGNOSIS — J45909 Unspecified asthma, uncomplicated: Secondary | ICD-10-CM | POA: Diagnosis not present

## 2022-07-19 ENCOUNTER — Other Ambulatory Visit: Payer: Self-pay

## 2022-07-19 ENCOUNTER — Encounter: Payer: Self-pay | Admitting: Internal Medicine

## 2022-07-19 ENCOUNTER — Ambulatory Visit (INDEPENDENT_AMBULATORY_CARE_PROVIDER_SITE_OTHER): Payer: Medicare Other | Admitting: Internal Medicine

## 2022-07-19 VITALS — BP 108/68 | Temp 99.3°F | Ht 62.0 in | Wt 218.0 lb

## 2022-07-19 DIAGNOSIS — K219 Gastro-esophageal reflux disease without esophagitis: Secondary | ICD-10-CM | POA: Diagnosis not present

## 2022-07-19 DIAGNOSIS — G4700 Insomnia, unspecified: Secondary | ICD-10-CM | POA: Diagnosis not present

## 2022-07-19 DIAGNOSIS — I10 Essential (primary) hypertension: Secondary | ICD-10-CM | POA: Diagnosis not present

## 2022-07-19 DIAGNOSIS — Z7982 Long term (current) use of aspirin: Secondary | ICD-10-CM | POA: Diagnosis not present

## 2022-07-19 DIAGNOSIS — M4620 Osteomyelitis of vertebra, site unspecified: Secondary | ICD-10-CM | POA: Diagnosis not present

## 2022-07-19 DIAGNOSIS — Z7951 Long term (current) use of inhaled steroids: Secondary | ICD-10-CM | POA: Diagnosis not present

## 2022-07-19 DIAGNOSIS — Z9181 History of falling: Secondary | ICD-10-CM | POA: Diagnosis not present

## 2022-07-19 DIAGNOSIS — Z791 Long term (current) use of non-steroidal anti-inflammatories (NSAID): Secondary | ICD-10-CM | POA: Diagnosis not present

## 2022-07-19 DIAGNOSIS — K589 Irritable bowel syndrome without diarrhea: Secondary | ICD-10-CM | POA: Diagnosis not present

## 2022-07-19 DIAGNOSIS — D649 Anemia, unspecified: Secondary | ICD-10-CM | POA: Diagnosis not present

## 2022-07-19 DIAGNOSIS — Z87891 Personal history of nicotine dependence: Secondary | ICD-10-CM | POA: Diagnosis not present

## 2022-07-19 DIAGNOSIS — Z79891 Long term (current) use of opiate analgesic: Secondary | ICD-10-CM | POA: Diagnosis not present

## 2022-07-19 DIAGNOSIS — B9689 Other specified bacterial agents as the cause of diseases classified elsewhere: Secondary | ICD-10-CM | POA: Diagnosis not present

## 2022-07-19 DIAGNOSIS — Z87440 Personal history of urinary (tract) infections: Secondary | ICD-10-CM | POA: Diagnosis not present

## 2022-07-19 DIAGNOSIS — J45909 Unspecified asthma, uncomplicated: Secondary | ICD-10-CM | POA: Diagnosis not present

## 2022-07-19 DIAGNOSIS — E039 Hypothyroidism, unspecified: Secondary | ICD-10-CM | POA: Diagnosis not present

## 2022-07-19 DIAGNOSIS — F32A Depression, unspecified: Secondary | ICD-10-CM | POA: Diagnosis not present

## 2022-07-19 DIAGNOSIS — T8142XA Infection following a procedure, deep incisional surgical site, initial encounter: Secondary | ICD-10-CM | POA: Diagnosis not present

## 2022-07-19 DIAGNOSIS — M797 Fibromyalgia: Secondary | ICD-10-CM | POA: Diagnosis not present

## 2022-07-19 DIAGNOSIS — M4316 Spondylolisthesis, lumbar region: Secondary | ICD-10-CM | POA: Diagnosis not present

## 2022-07-19 NOTE — Progress Notes (Unsigned)
Patient Active Problem List   Diagnosis Date Noted   Wound infection after surgery 05/14/2022   Delayed surgical wound healing 05/12/2022   Postoperative wound infection 04/08/2022   Scoliosis due to degenerative disease of spine in adult patient 03/30/2022   HNP (herniated nucleus pulposus), cervical 12/08/2020   Lumbago with sciatica, unspecified side 07/28/2020   PICC (peripherally inserted central catheter) in place 03/16/2020   Encounter for long-term (current) use of antibiotics 03/16/2020   Polypharmacy 03/09/2020   At risk for adverse drug event 03/09/2020   MSSA bacteremia 02/29/2020   Postoperative infection 02/28/2020   Abnormal gait due to muscle weakness 02/26/2020   HNP (herniated nucleus pulposus), lumbar 02/16/2020   Muscle weakness of lower extremity 02/14/2020   Spondylolisthesis of lumbar region 10/30/2017   Painful orthopaedic hardware right ankle 05/27/2016   Closed fracture of distal end of right fibula and tibia    Ankle syndesmosis disruption    ASCUS favor benign 01/06/2014   Leg swelling 12/22/2013   Seasonal allergies    Anemia    Chronic back pain due to DJD with history surgery    Hyperthyroidism    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 03/24/2012   Status post left total prosthetic replacement of knee joint using cement 03/24/2012   Fibromyalgia    GERD (gastroesophageal reflux disease)    Anxiety    Depression    Hypertension    Lumbar degenerative disc disease 09/25/2011    Patient's Medications  New Prescriptions   No medications on file  Previous Medications   ACETAMINOPHEN (TYLENOL) 500 MG TABLET    Take 1,000 mg by mouth every 8 (eight) hours as needed for moderate pain.   ALBUTEROL (VENTOLIN HFA) 108 (90 BASE) MCG/ACT INHALER    Inhale 2 puffs into the lungs every 6 (six) hours as needed for wheezing or shortness of breath.   ASCORBIC ACID (VITAMIN C) 500 MG TABLET    Take 500 mg by mouth daily.    ASPIRIN EC 81 MG TABLET    Take 81 mg by mouth daily.   ATENOLOL (TENORMIN) 50 MG TABLET    Take 1 tablet (50 mg total) by mouth daily.   CELEBREX 200 MG CAPSULE    Take 1 capsule (200 mg total) by mouth 2 (two) times daily.   CHLORZOXAZONE (PARAFON) 500 MG TABLET    Take 1 tablet (500 mg total) by mouth 3 (three) times daily.   CYCLOBENZAPRINE (FLEXERIL) 10 MG TABLET    Take 10 mg by mouth 3 (three) times daily.   DIAZEPAM (VALIUM) 5 MG TABLET    Take 1 tablet (5 mg total) by mouth 2 (two) times daily.   DICLOFENAC (FLECTOR) 1.3 % PTCH    Place 1 patch onto the skin 2 (two) times daily as needed (pain.).   DICLOFENAC SODIUM (VOLTAREN) 1 % GEL    Apply 2 g topically 4 (four) times daily as needed (pain).   DIETHYLPROPION HCL CR 75 MG TB24    Take 75 mg by mouth daily.   DIPHENHYDRAMINE (BENADRYL) 25 MG TABLET    Take 50 mg by mouth in the morning, at noon, in the evening, and at bedtime.   FUROSEMIDE (LASIX) 20 MG TABLET    Take 2 tablets (40 mg total) by mouth daily.   GABAPENTIN (NEURONTIN) 600 MG TABLET    Take 2 tablets (1,200 mg total) by mouth 3 (three) times daily.  GUAIFENESIN (MUCINEX) 600 MG 12 HR TABLET    Take 600 mg by mouth 2 (two) times daily as needed for cough.   HYDROCHLOROTHIAZIDE (HYDRODIURIL) 25 MG TABLET    Take 1 tablet (25 mg total) by mouth daily. One tablet by mouth in the morning.   HYDROXYZINE (ATARAX/VISTARIL) 50 MG TABLET    Take 50 mg by mouth 2 (two) times daily.   LUBIPROSTONE (AMITIZA) 24 MCG CAPSULE    Take 1 capsule (24 mcg total) by mouth 2 (two) times daily with a meal.   METHOCARBAMOL (ROBAXIN) 500 MG TABLET    Take 1 tablet (500 mg total) by mouth every 6 (six) hours as needed for muscle spasms.   MULTIPLE VITAMIN (MULTIVITAMIN WITH MINERALS) TABS TABLET    Take 1 tablet by mouth daily as needed.   NARCAN 4 MG/0.1ML LIQD NASAL SPRAY KIT    Place 1 spray into the nose once.   OMEPRAZOLE (PRILOSEC) 20 MG CAPSULE    Take 20 mg by mouth in the morning and at  bedtime.    OXYCODONE-ACETAMINOPHEN (PERCOCET) 10-325 MG TABLET    Take 1 tablet by mouth every 6 (six) hours as needed for pain.   POTASSIUM CHLORIDE (KLOR-CON) 10 MEQ TABLET    Take 1 tablet (10 mEq total) by mouth daily.   PROPYLENE GLYCOL (SYSTANE COMPLETE) 0.6 % SOLN    Place 1 drop into both eyes daily as needed (dry eyes).   SENNA (SENOKOT) 8.6 MG TABS TABLET    Take 2 tablets by mouth daily before lunch.   SULFAMETHOXAZOLE-TRIMETHOPRIM (BACTRIM DS) 800-160 MG TABLET    Take 1 tablet by mouth 2 (two) times daily.   VITAMIN D, ERGOCALCIFEROL, (DRISDOL) 1.25 MG (50000 UNIT) CAPS CAPSULE    Take 50,000 Units by mouth once a week.   ZOLPIDEM (AMBIEN) 10 MG TABLET    Take 10 mg by mouth at bedtime as needed for sleep.  Modified Medications   No medications on file  Discontinued Medications   No medications on file    Subjective: ***  She tinks it is making her firbromylia worse, she thinks it is giving her brain fog. She reports the symptoms started 2 weeks in to strting bactrim. Sh e contnues to take it twice a day.  Review of Systems: ROS  Past Medical History:  Diagnosis Date   Anemia    Ankle syndesmosis disruption    Anxiety    takes Ativan and Valium, after mother passed   Arthritis    bilateral knees s/p knee replacement bilaterally   Asthma    Bronchitis    Bruising    pt states unexplained d/t fibromyalgia   Chronic back pain    2012 tailbone surgery and 3 lower discs.    Closed fracture of distal end of right fibula and tibia    Depression    from Fibromyalgia diagnosis; not taking medicine. since 2001   Dizziness    rarely   Fibromyalgia    diagnosed 2001   GERD (gastroesophageal reflux disease)    Prilosec occasionally   Headache(784.0)    "sinus headaches"   History of hiatal hernia    Hypertension    since 2013   Hyperthyroidism    subclinical, no treatment; thyroid nodules   IBS (irritable bowel syndrome)    Impaired memory    states from  fibromyalgia   Insomnia    takes Ambien   Jones fracture    left foot fifth metatarsal   Multiple  allergies    including latex, pet dander, shellfish, pet dander   Painful orthopaedic hardware right ankle 05/27/2016   Seasonal allergies    Shortness of breath    Occasional with exertion;    Sore gums    this is why pt is on Amoxil-only takes for dental work   Tachycardia    Thyroid goiter    Varicose vein    protrudes above skin-per pt;vein popped and bruised;ultrasound done to make sure that there were no clots;noclots were found    Social History   Tobacco Use   Smoking status: Former    Packs/day: 0.50    Years: 10.00    Total pack years: 5.00    Types: Cigarettes    Quit date: 12/06/1998    Years since quitting: 23.6   Smokeless tobacco: Never  Vaping Use   Vaping Use: Never used  Substance Use Topics   Alcohol use: No   Drug use: No    Family History  Problem Relation Age of Onset   Stroke Father        passed 2004 from pneumonia   Alcohol abuse Father    Pulmonary embolism Mother        after minor knee surgery leading to DVT   Arthritis Sister    Depression Sister    Hypertension Sister    Diabetes Other        grandmother   Anesthesia problems Neg Hx    Hypotension Neg Hx    Malignant hyperthermia Neg Hx    Pseudochol deficiency Neg Hx     Allergies  Allergen Reactions   Monosodium Glutamate Anaphylaxis and Swelling    Eyes swollen shut, facial swelling, tongue swelling.   Shellfish Allergy Anaphylaxis   Baclofen Nausea Only    Dizziness and increase muscle spasm   Celebrex [Celecoxib] Itching    Only allergic to generic brand   Contrast Media [Iodinated Contrast Media] Itching and Nausea Only    "could not walk"   Diclofenac Itching    Generic Diclefenac gel causes itching. Can take the name brand Voltaren gel   Molds & Smuts Other (See Comments)    Causes allergies to flair up   Other Other (See Comments)    Pet dander,   Green rubber  piece to hold mouth open (oval shape)     Sulfa Antibiotics Other (See Comments)    Unknown   Latex Itching and Rash    Latex glove with powder    Health Maintenance  Topic Date Due   COVID-19 Vaccine (1) Never done   Hepatitis C Screening  Never done   Zoster Vaccines- Shingrix (1 of 2) Never done   COLONOSCOPY (Pts 45-66yr Insurance coverage will need to be confirmed)  Never done   PAP SMEAR-Modifier  12/12/2018   TETANUS/TDAP  05/08/2020   INFLUENZA VACCINE  05/07/2022   MAMMOGRAM  12/22/2023   HIV Screening  Completed   HPV VACCINES  Aged Out    Objective:  Vitals:   07/19/22 0954  BP: 108/68  Temp: 99.3 F (37.4 C)  TempSrc: Oral  SpO2: 94%  Weight: 218 lb (98.9 kg)  Height: _0  (1.575 m)   Body mass index is 39.87 kg/m.  Physical Exam  Lab Results Lab Results  Component Value Date   WBC 12.0 (H) 05/14/2022   HGB 9.6 (L) 05/14/2022   HCT 34.2 (L) 05/14/2022   MCV 68.4 (L) 05/14/2022   PLT 423 (H) 05/14/2022  Lab Results  Component Value Date   CREATININE 0.59 05/14/2022   BUN 13 05/12/2022   NA 139 05/12/2022   K 4.0 05/12/2022   CL 111 05/12/2022   CO2 21 (L) 05/12/2022    Lab Results  Component Value Date   ALT 5 (A) 03/22/2020   AST 22 03/22/2020   ALKPHOS 192 (A) 03/22/2020   BILITOT 1.0 03/02/2020    Lab Results  Component Value Date   CHOL 196 12/30/2013   HDL 70 12/30/2013   LDLCALC 106 (H) 12/30/2013   TRIG 102 12/30/2013   CHOLHDL 2.8 12/30/2013   Lab Results  Component Value Date   LABRPR NON REAC 12/30/2013   No results found for: "HIV1RNAQUANT", "HIV1RNAVL", "CD4TABS"   Problem List Items Addressed This Visit   None #Postop op lumbar wound with retained HWR with Cx+ Enterobacter cloacae #Hx of MSSA lumbar hardware infection 9/2 last day of antibiotics.             Antibiotics: Cefepime 7/6-9/2 Ceftriaxone 7/3-7/5 Vancomycin 6/30-7/3 -Seen by Dr. Cyndy Freeze on 10/5, next visit 11/2 -Does not recall being on  Bactrim(sulfa allergy listed, pt does not think she has taken sulfa drugs).  Allergy was added on 5/13 while she was on Keflex.  She has side effect of hyperpigmentation.  I suspect that Bactrim allergy may have been a Keflex allergy. -completed 6 weeks of antibiotics with cefepime -Started bactrim, pt has some issues butis able to tolerate. Unclear if it is due to other meds Plan: -Release of records Dr. Cyndy Freeze. Wound is drainaing aobut the same, pt reports that that was expected.  -labs today 25fu 647month  [facility] 07/19/2022, 9:59 AM

## 2022-07-20 LAB — COMPREHENSIVE METABOLIC PANEL
AG Ratio: 1.7 (calc) (ref 1.0–2.5)
ALT: 12 U/L (ref 6–29)
AST: 14 U/L (ref 10–35)
Albumin: 3.9 g/dL (ref 3.6–5.1)
Alkaline phosphatase (APISO): 177 U/L — ABNORMAL HIGH (ref 37–153)
BUN: 15 mg/dL (ref 7–25)
CO2: 25 mmol/L (ref 20–32)
Calcium: 9 mg/dL (ref 8.6–10.4)
Chloride: 104 mmol/L (ref 98–110)
Creat: 0.92 mg/dL (ref 0.50–1.03)
Globulin: 2.3 g/dL (calc) (ref 1.9–3.7)
Glucose, Bld: 93 mg/dL (ref 65–99)
Potassium: 4.7 mmol/L (ref 3.5–5.3)
Sodium: 139 mmol/L (ref 135–146)
Total Bilirubin: 0.2 mg/dL (ref 0.2–1.2)
Total Protein: 6.2 g/dL (ref 6.1–8.1)

## 2022-07-20 LAB — CBC WITH DIFFERENTIAL/PLATELET
Absolute Monocytes: 979 cells/uL — ABNORMAL HIGH (ref 200–950)
Basophils Absolute: 61 cells/uL (ref 0–200)
Basophils Relative: 0.6 %
Eosinophils Absolute: 316 cells/uL (ref 15–500)
Eosinophils Relative: 3.1 %
HCT: 27 % — ABNORMAL LOW (ref 35.0–45.0)
Hemoglobin: 7.8 g/dL — ABNORMAL LOW (ref 11.7–15.5)
Lymphs Abs: 3080 cells/uL (ref 850–3900)
MCH: 17.7 pg — ABNORMAL LOW (ref 27.0–33.0)
MCHC: 28.9 g/dL — ABNORMAL LOW (ref 32.0–36.0)
MCV: 61.4 fL — ABNORMAL LOW (ref 80.0–100.0)
MPV: 9.7 fL (ref 7.5–12.5)
Monocytes Relative: 9.6 %
Neutro Abs: 5763 cells/uL (ref 1500–7800)
Neutrophils Relative %: 56.5 %
Platelets: 479 10*3/uL — ABNORMAL HIGH (ref 140–400)
RBC: 4.4 10*6/uL (ref 3.80–5.10)
RDW: 18.1 % — ABNORMAL HIGH (ref 11.0–15.0)
Total Lymphocyte: 30.2 %
WBC: 10.2 10*3/uL (ref 3.8–10.8)

## 2022-07-20 LAB — SEDIMENTATION RATE: Sed Rate: 38 mm/h — ABNORMAL HIGH (ref 0–30)

## 2022-07-20 LAB — C-REACTIVE PROTEIN: CRP: 18 mg/L — ABNORMAL HIGH (ref <8.0)

## 2022-07-20 LAB — CBC MORPHOLOGY

## 2022-07-22 DIAGNOSIS — Z79891 Long term (current) use of opiate analgesic: Secondary | ICD-10-CM | POA: Diagnosis not present

## 2022-07-22 DIAGNOSIS — M4316 Spondylolisthesis, lumbar region: Secondary | ICD-10-CM | POA: Diagnosis not present

## 2022-07-22 DIAGNOSIS — G4700 Insomnia, unspecified: Secondary | ICD-10-CM | POA: Diagnosis not present

## 2022-07-22 DIAGNOSIS — K219 Gastro-esophageal reflux disease without esophagitis: Secondary | ICD-10-CM | POA: Diagnosis not present

## 2022-07-22 DIAGNOSIS — Z87891 Personal history of nicotine dependence: Secondary | ICD-10-CM | POA: Diagnosis not present

## 2022-07-22 DIAGNOSIS — F32A Depression, unspecified: Secondary | ICD-10-CM | POA: Diagnosis not present

## 2022-07-22 DIAGNOSIS — E039 Hypothyroidism, unspecified: Secondary | ICD-10-CM | POA: Diagnosis not present

## 2022-07-22 DIAGNOSIS — Z9181 History of falling: Secondary | ICD-10-CM | POA: Diagnosis not present

## 2022-07-22 DIAGNOSIS — D649 Anemia, unspecified: Secondary | ICD-10-CM | POA: Diagnosis not present

## 2022-07-22 DIAGNOSIS — M797 Fibromyalgia: Secondary | ICD-10-CM | POA: Diagnosis not present

## 2022-07-22 DIAGNOSIS — Z791 Long term (current) use of non-steroidal anti-inflammatories (NSAID): Secondary | ICD-10-CM | POA: Diagnosis not present

## 2022-07-22 DIAGNOSIS — Z7951 Long term (current) use of inhaled steroids: Secondary | ICD-10-CM | POA: Diagnosis not present

## 2022-07-22 DIAGNOSIS — Z7982 Long term (current) use of aspirin: Secondary | ICD-10-CM | POA: Diagnosis not present

## 2022-07-22 DIAGNOSIS — Z87440 Personal history of urinary (tract) infections: Secondary | ICD-10-CM | POA: Diagnosis not present

## 2022-07-22 DIAGNOSIS — T8142XA Infection following a procedure, deep incisional surgical site, initial encounter: Secondary | ICD-10-CM | POA: Diagnosis not present

## 2022-07-22 DIAGNOSIS — B9689 Other specified bacterial agents as the cause of diseases classified elsewhere: Secondary | ICD-10-CM | POA: Diagnosis not present

## 2022-07-22 DIAGNOSIS — J45909 Unspecified asthma, uncomplicated: Secondary | ICD-10-CM | POA: Diagnosis not present

## 2022-07-22 DIAGNOSIS — I10 Essential (primary) hypertension: Secondary | ICD-10-CM | POA: Diagnosis not present

## 2022-07-22 DIAGNOSIS — K589 Irritable bowel syndrome without diarrhea: Secondary | ICD-10-CM | POA: Diagnosis not present

## 2022-07-24 DIAGNOSIS — Z7982 Long term (current) use of aspirin: Secondary | ICD-10-CM | POA: Diagnosis not present

## 2022-07-24 DIAGNOSIS — E039 Hypothyroidism, unspecified: Secondary | ICD-10-CM | POA: Diagnosis not present

## 2022-07-24 DIAGNOSIS — Z87440 Personal history of urinary (tract) infections: Secondary | ICD-10-CM | POA: Diagnosis not present

## 2022-07-24 DIAGNOSIS — J45909 Unspecified asthma, uncomplicated: Secondary | ICD-10-CM | POA: Diagnosis not present

## 2022-07-24 DIAGNOSIS — D649 Anemia, unspecified: Secondary | ICD-10-CM | POA: Diagnosis not present

## 2022-07-24 DIAGNOSIS — B9689 Other specified bacterial agents as the cause of diseases classified elsewhere: Secondary | ICD-10-CM | POA: Diagnosis not present

## 2022-07-24 DIAGNOSIS — K219 Gastro-esophageal reflux disease without esophagitis: Secondary | ICD-10-CM | POA: Diagnosis not present

## 2022-07-24 DIAGNOSIS — M797 Fibromyalgia: Secondary | ICD-10-CM | POA: Diagnosis not present

## 2022-07-24 DIAGNOSIS — Z791 Long term (current) use of non-steroidal anti-inflammatories (NSAID): Secondary | ICD-10-CM | POA: Diagnosis not present

## 2022-07-24 DIAGNOSIS — I10 Essential (primary) hypertension: Secondary | ICD-10-CM | POA: Diagnosis not present

## 2022-07-24 DIAGNOSIS — Z87891 Personal history of nicotine dependence: Secondary | ICD-10-CM | POA: Diagnosis not present

## 2022-07-24 DIAGNOSIS — Z7951 Long term (current) use of inhaled steroids: Secondary | ICD-10-CM | POA: Diagnosis not present

## 2022-07-24 DIAGNOSIS — F32A Depression, unspecified: Secondary | ICD-10-CM | POA: Diagnosis not present

## 2022-07-24 DIAGNOSIS — Z79891 Long term (current) use of opiate analgesic: Secondary | ICD-10-CM | POA: Diagnosis not present

## 2022-07-24 DIAGNOSIS — T8142XA Infection following a procedure, deep incisional surgical site, initial encounter: Secondary | ICD-10-CM | POA: Diagnosis not present

## 2022-07-24 DIAGNOSIS — K589 Irritable bowel syndrome without diarrhea: Secondary | ICD-10-CM | POA: Diagnosis not present

## 2022-07-24 DIAGNOSIS — G4700 Insomnia, unspecified: Secondary | ICD-10-CM | POA: Diagnosis not present

## 2022-07-24 DIAGNOSIS — Z9181 History of falling: Secondary | ICD-10-CM | POA: Diagnosis not present

## 2022-07-24 DIAGNOSIS — M4316 Spondylolisthesis, lumbar region: Secondary | ICD-10-CM | POA: Diagnosis not present

## 2022-07-25 DIAGNOSIS — Z87440 Personal history of urinary (tract) infections: Secondary | ICD-10-CM | POA: Diagnosis not present

## 2022-07-25 DIAGNOSIS — M4316 Spondylolisthesis, lumbar region: Secondary | ICD-10-CM | POA: Diagnosis not present

## 2022-07-25 DIAGNOSIS — Z7951 Long term (current) use of inhaled steroids: Secondary | ICD-10-CM | POA: Diagnosis not present

## 2022-07-25 DIAGNOSIS — Z79891 Long term (current) use of opiate analgesic: Secondary | ICD-10-CM | POA: Diagnosis not present

## 2022-07-25 DIAGNOSIS — F32A Depression, unspecified: Secondary | ICD-10-CM | POA: Diagnosis not present

## 2022-07-25 DIAGNOSIS — E039 Hypothyroidism, unspecified: Secondary | ICD-10-CM | POA: Diagnosis not present

## 2022-07-25 DIAGNOSIS — K219 Gastro-esophageal reflux disease without esophagitis: Secondary | ICD-10-CM | POA: Diagnosis not present

## 2022-07-25 DIAGNOSIS — Z7982 Long term (current) use of aspirin: Secondary | ICD-10-CM | POA: Diagnosis not present

## 2022-07-25 DIAGNOSIS — Z87891 Personal history of nicotine dependence: Secondary | ICD-10-CM | POA: Diagnosis not present

## 2022-07-25 DIAGNOSIS — D649 Anemia, unspecified: Secondary | ICD-10-CM | POA: Diagnosis not present

## 2022-07-25 DIAGNOSIS — T8142XA Infection following a procedure, deep incisional surgical site, initial encounter: Secondary | ICD-10-CM | POA: Diagnosis not present

## 2022-07-25 DIAGNOSIS — G4700 Insomnia, unspecified: Secondary | ICD-10-CM | POA: Diagnosis not present

## 2022-07-25 DIAGNOSIS — M797 Fibromyalgia: Secondary | ICD-10-CM | POA: Diagnosis not present

## 2022-07-25 DIAGNOSIS — J45909 Unspecified asthma, uncomplicated: Secondary | ICD-10-CM | POA: Diagnosis not present

## 2022-07-25 DIAGNOSIS — Z9181 History of falling: Secondary | ICD-10-CM | POA: Diagnosis not present

## 2022-07-25 DIAGNOSIS — K589 Irritable bowel syndrome without diarrhea: Secondary | ICD-10-CM | POA: Diagnosis not present

## 2022-07-25 DIAGNOSIS — Z791 Long term (current) use of non-steroidal anti-inflammatories (NSAID): Secondary | ICD-10-CM | POA: Diagnosis not present

## 2022-07-25 DIAGNOSIS — B9689 Other specified bacterial agents as the cause of diseases classified elsewhere: Secondary | ICD-10-CM | POA: Diagnosis not present

## 2022-07-25 DIAGNOSIS — I10 Essential (primary) hypertension: Secondary | ICD-10-CM | POA: Diagnosis not present

## 2022-07-26 DIAGNOSIS — D649 Anemia, unspecified: Secondary | ICD-10-CM | POA: Diagnosis not present

## 2022-07-26 DIAGNOSIS — K589 Irritable bowel syndrome without diarrhea: Secondary | ICD-10-CM | POA: Diagnosis not present

## 2022-07-26 DIAGNOSIS — Z79891 Long term (current) use of opiate analgesic: Secondary | ICD-10-CM | POA: Diagnosis not present

## 2022-07-26 DIAGNOSIS — M4316 Spondylolisthesis, lumbar region: Secondary | ICD-10-CM | POA: Diagnosis not present

## 2022-07-26 DIAGNOSIS — J45909 Unspecified asthma, uncomplicated: Secondary | ICD-10-CM | POA: Diagnosis not present

## 2022-07-26 DIAGNOSIS — Z9181 History of falling: Secondary | ICD-10-CM | POA: Diagnosis not present

## 2022-07-26 DIAGNOSIS — Z7951 Long term (current) use of inhaled steroids: Secondary | ICD-10-CM | POA: Diagnosis not present

## 2022-07-26 DIAGNOSIS — K219 Gastro-esophageal reflux disease without esophagitis: Secondary | ICD-10-CM | POA: Diagnosis not present

## 2022-07-26 DIAGNOSIS — Z791 Long term (current) use of non-steroidal anti-inflammatories (NSAID): Secondary | ICD-10-CM | POA: Diagnosis not present

## 2022-07-26 DIAGNOSIS — I10 Essential (primary) hypertension: Secondary | ICD-10-CM | POA: Diagnosis not present

## 2022-07-26 DIAGNOSIS — Z87891 Personal history of nicotine dependence: Secondary | ICD-10-CM | POA: Diagnosis not present

## 2022-07-26 DIAGNOSIS — M797 Fibromyalgia: Secondary | ICD-10-CM | POA: Diagnosis not present

## 2022-07-26 DIAGNOSIS — T8142XA Infection following a procedure, deep incisional surgical site, initial encounter: Secondary | ICD-10-CM | POA: Diagnosis not present

## 2022-07-26 DIAGNOSIS — B9689 Other specified bacterial agents as the cause of diseases classified elsewhere: Secondary | ICD-10-CM | POA: Diagnosis not present

## 2022-07-26 DIAGNOSIS — F32A Depression, unspecified: Secondary | ICD-10-CM | POA: Diagnosis not present

## 2022-07-26 DIAGNOSIS — Z7982 Long term (current) use of aspirin: Secondary | ICD-10-CM | POA: Diagnosis not present

## 2022-07-26 DIAGNOSIS — Z87440 Personal history of urinary (tract) infections: Secondary | ICD-10-CM | POA: Diagnosis not present

## 2022-07-26 DIAGNOSIS — G4700 Insomnia, unspecified: Secondary | ICD-10-CM | POA: Diagnosis not present

## 2022-07-26 DIAGNOSIS — E039 Hypothyroidism, unspecified: Secondary | ICD-10-CM | POA: Diagnosis not present

## 2022-07-29 DIAGNOSIS — J45909 Unspecified asthma, uncomplicated: Secondary | ICD-10-CM | POA: Diagnosis not present

## 2022-07-29 DIAGNOSIS — Z87440 Personal history of urinary (tract) infections: Secondary | ICD-10-CM | POA: Diagnosis not present

## 2022-07-29 DIAGNOSIS — Z87891 Personal history of nicotine dependence: Secondary | ICD-10-CM | POA: Diagnosis not present

## 2022-07-29 DIAGNOSIS — Z79891 Long term (current) use of opiate analgesic: Secondary | ICD-10-CM | POA: Diagnosis not present

## 2022-07-29 DIAGNOSIS — E039 Hypothyroidism, unspecified: Secondary | ICD-10-CM | POA: Diagnosis not present

## 2022-07-29 DIAGNOSIS — Z7982 Long term (current) use of aspirin: Secondary | ICD-10-CM | POA: Diagnosis not present

## 2022-07-29 DIAGNOSIS — F32A Depression, unspecified: Secondary | ICD-10-CM | POA: Diagnosis not present

## 2022-07-29 DIAGNOSIS — I10 Essential (primary) hypertension: Secondary | ICD-10-CM | POA: Diagnosis not present

## 2022-07-29 DIAGNOSIS — G4700 Insomnia, unspecified: Secondary | ICD-10-CM | POA: Diagnosis not present

## 2022-07-29 DIAGNOSIS — M4316 Spondylolisthesis, lumbar region: Secondary | ICD-10-CM | POA: Diagnosis not present

## 2022-07-29 DIAGNOSIS — Z9181 History of falling: Secondary | ICD-10-CM | POA: Diagnosis not present

## 2022-07-29 DIAGNOSIS — Z7951 Long term (current) use of inhaled steroids: Secondary | ICD-10-CM | POA: Diagnosis not present

## 2022-07-29 DIAGNOSIS — B9689 Other specified bacterial agents as the cause of diseases classified elsewhere: Secondary | ICD-10-CM | POA: Diagnosis not present

## 2022-07-29 DIAGNOSIS — Z791 Long term (current) use of non-steroidal anti-inflammatories (NSAID): Secondary | ICD-10-CM | POA: Diagnosis not present

## 2022-07-29 DIAGNOSIS — T8142XA Infection following a procedure, deep incisional surgical site, initial encounter: Secondary | ICD-10-CM | POA: Diagnosis not present

## 2022-07-29 DIAGNOSIS — K589 Irritable bowel syndrome without diarrhea: Secondary | ICD-10-CM | POA: Diagnosis not present

## 2022-07-29 DIAGNOSIS — D649 Anemia, unspecified: Secondary | ICD-10-CM | POA: Diagnosis not present

## 2022-07-29 DIAGNOSIS — M797 Fibromyalgia: Secondary | ICD-10-CM | POA: Diagnosis not present

## 2022-07-29 DIAGNOSIS — K219 Gastro-esophageal reflux disease without esophagitis: Secondary | ICD-10-CM | POA: Diagnosis not present

## 2022-07-30 DIAGNOSIS — Z9181 History of falling: Secondary | ICD-10-CM | POA: Diagnosis not present

## 2022-07-30 DIAGNOSIS — G4700 Insomnia, unspecified: Secondary | ICD-10-CM | POA: Diagnosis not present

## 2022-07-30 DIAGNOSIS — J45909 Unspecified asthma, uncomplicated: Secondary | ICD-10-CM | POA: Diagnosis not present

## 2022-07-30 DIAGNOSIS — D649 Anemia, unspecified: Secondary | ICD-10-CM | POA: Diagnosis not present

## 2022-07-30 DIAGNOSIS — B9689 Other specified bacterial agents as the cause of diseases classified elsewhere: Secondary | ICD-10-CM | POA: Diagnosis not present

## 2022-07-30 DIAGNOSIS — T8142XA Infection following a procedure, deep incisional surgical site, initial encounter: Secondary | ICD-10-CM | POA: Diagnosis not present

## 2022-07-30 DIAGNOSIS — Z79891 Long term (current) use of opiate analgesic: Secondary | ICD-10-CM | POA: Diagnosis not present

## 2022-07-30 DIAGNOSIS — K219 Gastro-esophageal reflux disease without esophagitis: Secondary | ICD-10-CM | POA: Diagnosis not present

## 2022-07-30 DIAGNOSIS — Z7982 Long term (current) use of aspirin: Secondary | ICD-10-CM | POA: Diagnosis not present

## 2022-07-30 DIAGNOSIS — Z87440 Personal history of urinary (tract) infections: Secondary | ICD-10-CM | POA: Diagnosis not present

## 2022-07-30 DIAGNOSIS — F32A Depression, unspecified: Secondary | ICD-10-CM | POA: Diagnosis not present

## 2022-07-30 DIAGNOSIS — K589 Irritable bowel syndrome without diarrhea: Secondary | ICD-10-CM | POA: Diagnosis not present

## 2022-07-30 DIAGNOSIS — E039 Hypothyroidism, unspecified: Secondary | ICD-10-CM | POA: Diagnosis not present

## 2022-07-30 DIAGNOSIS — M4316 Spondylolisthesis, lumbar region: Secondary | ICD-10-CM | POA: Diagnosis not present

## 2022-07-30 DIAGNOSIS — I10 Essential (primary) hypertension: Secondary | ICD-10-CM | POA: Diagnosis not present

## 2022-07-30 DIAGNOSIS — Z87891 Personal history of nicotine dependence: Secondary | ICD-10-CM | POA: Diagnosis not present

## 2022-07-30 DIAGNOSIS — M797 Fibromyalgia: Secondary | ICD-10-CM | POA: Diagnosis not present

## 2022-07-30 DIAGNOSIS — Z791 Long term (current) use of non-steroidal anti-inflammatories (NSAID): Secondary | ICD-10-CM | POA: Diagnosis not present

## 2022-07-30 DIAGNOSIS — Z7951 Long term (current) use of inhaled steroids: Secondary | ICD-10-CM | POA: Diagnosis not present

## 2022-08-01 DIAGNOSIS — Z0000 Encounter for general adult medical examination without abnormal findings: Secondary | ICD-10-CM | POA: Diagnosis not present

## 2022-08-01 DIAGNOSIS — K219 Gastro-esophageal reflux disease without esophagitis: Secondary | ICD-10-CM | POA: Diagnosis not present

## 2022-08-01 DIAGNOSIS — J45909 Unspecified asthma, uncomplicated: Secondary | ICD-10-CM | POA: Diagnosis not present

## 2022-08-01 DIAGNOSIS — E7800 Pure hypercholesterolemia, unspecified: Secondary | ICD-10-CM | POA: Diagnosis not present

## 2022-08-01 DIAGNOSIS — I10 Essential (primary) hypertension: Secondary | ICD-10-CM | POA: Diagnosis not present

## 2022-08-01 DIAGNOSIS — M797 Fibromyalgia: Secondary | ICD-10-CM | POA: Diagnosis not present

## 2022-08-01 DIAGNOSIS — I700 Atherosclerosis of aorta: Secondary | ICD-10-CM | POA: Diagnosis not present

## 2022-08-01 DIAGNOSIS — K589 Irritable bowel syndrome without diarrhea: Secondary | ICD-10-CM | POA: Diagnosis not present

## 2022-08-01 DIAGNOSIS — M542 Cervicalgia: Secondary | ICD-10-CM | POA: Diagnosis not present

## 2022-08-01 DIAGNOSIS — Z23 Encounter for immunization: Secondary | ICD-10-CM | POA: Diagnosis not present

## 2022-08-03 DIAGNOSIS — I10 Essential (primary) hypertension: Secondary | ICD-10-CM | POA: Diagnosis not present

## 2022-08-03 DIAGNOSIS — B9689 Other specified bacterial agents as the cause of diseases classified elsewhere: Secondary | ICD-10-CM | POA: Diagnosis not present

## 2022-08-03 DIAGNOSIS — K589 Irritable bowel syndrome without diarrhea: Secondary | ICD-10-CM | POA: Diagnosis not present

## 2022-08-03 DIAGNOSIS — Z7951 Long term (current) use of inhaled steroids: Secondary | ICD-10-CM | POA: Diagnosis not present

## 2022-08-03 DIAGNOSIS — K219 Gastro-esophageal reflux disease without esophagitis: Secondary | ICD-10-CM | POA: Diagnosis not present

## 2022-08-03 DIAGNOSIS — E039 Hypothyroidism, unspecified: Secondary | ICD-10-CM | POA: Diagnosis not present

## 2022-08-03 DIAGNOSIS — M797 Fibromyalgia: Secondary | ICD-10-CM | POA: Diagnosis not present

## 2022-08-03 DIAGNOSIS — F32A Depression, unspecified: Secondary | ICD-10-CM | POA: Diagnosis not present

## 2022-08-03 DIAGNOSIS — M4316 Spondylolisthesis, lumbar region: Secondary | ICD-10-CM | POA: Diagnosis not present

## 2022-08-03 DIAGNOSIS — Z87440 Personal history of urinary (tract) infections: Secondary | ICD-10-CM | POA: Diagnosis not present

## 2022-08-03 DIAGNOSIS — Z79891 Long term (current) use of opiate analgesic: Secondary | ICD-10-CM | POA: Diagnosis not present

## 2022-08-03 DIAGNOSIS — D649 Anemia, unspecified: Secondary | ICD-10-CM | POA: Diagnosis not present

## 2022-08-03 DIAGNOSIS — Z9181 History of falling: Secondary | ICD-10-CM | POA: Diagnosis not present

## 2022-08-03 DIAGNOSIS — Z791 Long term (current) use of non-steroidal anti-inflammatories (NSAID): Secondary | ICD-10-CM | POA: Diagnosis not present

## 2022-08-03 DIAGNOSIS — J45909 Unspecified asthma, uncomplicated: Secondary | ICD-10-CM | POA: Diagnosis not present

## 2022-08-03 DIAGNOSIS — G4700 Insomnia, unspecified: Secondary | ICD-10-CM | POA: Diagnosis not present

## 2022-08-03 DIAGNOSIS — T8142XA Infection following a procedure, deep incisional surgical site, initial encounter: Secondary | ICD-10-CM | POA: Diagnosis not present

## 2022-08-03 DIAGNOSIS — Z87891 Personal history of nicotine dependence: Secondary | ICD-10-CM | POA: Diagnosis not present

## 2022-08-03 DIAGNOSIS — Z7982 Long term (current) use of aspirin: Secondary | ICD-10-CM | POA: Diagnosis not present

## 2022-08-06 DIAGNOSIS — Z79891 Long term (current) use of opiate analgesic: Secondary | ICD-10-CM | POA: Diagnosis not present

## 2022-08-06 DIAGNOSIS — M4316 Spondylolisthesis, lumbar region: Secondary | ICD-10-CM | POA: Diagnosis not present

## 2022-08-06 DIAGNOSIS — F32A Depression, unspecified: Secondary | ICD-10-CM | POA: Diagnosis not present

## 2022-08-06 DIAGNOSIS — B9689 Other specified bacterial agents as the cause of diseases classified elsewhere: Secondary | ICD-10-CM | POA: Diagnosis not present

## 2022-08-06 DIAGNOSIS — Z9181 History of falling: Secondary | ICD-10-CM | POA: Diagnosis not present

## 2022-08-06 DIAGNOSIS — Z7951 Long term (current) use of inhaled steroids: Secondary | ICD-10-CM | POA: Diagnosis not present

## 2022-08-06 DIAGNOSIS — T8142XA Infection following a procedure, deep incisional surgical site, initial encounter: Secondary | ICD-10-CM | POA: Diagnosis not present

## 2022-08-06 DIAGNOSIS — M797 Fibromyalgia: Secondary | ICD-10-CM | POA: Diagnosis not present

## 2022-08-06 DIAGNOSIS — Z791 Long term (current) use of non-steroidal anti-inflammatories (NSAID): Secondary | ICD-10-CM | POA: Diagnosis not present

## 2022-08-06 DIAGNOSIS — J45909 Unspecified asthma, uncomplicated: Secondary | ICD-10-CM | POA: Diagnosis not present

## 2022-08-06 DIAGNOSIS — I10 Essential (primary) hypertension: Secondary | ICD-10-CM | POA: Diagnosis not present

## 2022-08-06 DIAGNOSIS — Z87440 Personal history of urinary (tract) infections: Secondary | ICD-10-CM | POA: Diagnosis not present

## 2022-08-06 DIAGNOSIS — D649 Anemia, unspecified: Secondary | ICD-10-CM | POA: Diagnosis not present

## 2022-08-06 DIAGNOSIS — K219 Gastro-esophageal reflux disease without esophagitis: Secondary | ICD-10-CM | POA: Diagnosis not present

## 2022-08-06 DIAGNOSIS — K589 Irritable bowel syndrome without diarrhea: Secondary | ICD-10-CM | POA: Diagnosis not present

## 2022-08-06 DIAGNOSIS — E039 Hypothyroidism, unspecified: Secondary | ICD-10-CM | POA: Diagnosis not present

## 2022-08-06 DIAGNOSIS — G4700 Insomnia, unspecified: Secondary | ICD-10-CM | POA: Diagnosis not present

## 2022-08-06 DIAGNOSIS — Z87891 Personal history of nicotine dependence: Secondary | ICD-10-CM | POA: Diagnosis not present

## 2022-08-06 DIAGNOSIS — Z7982 Long term (current) use of aspirin: Secondary | ICD-10-CM | POA: Diagnosis not present

## 2022-08-07 DIAGNOSIS — Z87891 Personal history of nicotine dependence: Secondary | ICD-10-CM | POA: Diagnosis not present

## 2022-08-07 DIAGNOSIS — Z7951 Long term (current) use of inhaled steroids: Secondary | ICD-10-CM | POA: Diagnosis not present

## 2022-08-07 DIAGNOSIS — B9689 Other specified bacterial agents as the cause of diseases classified elsewhere: Secondary | ICD-10-CM | POA: Diagnosis not present

## 2022-08-07 DIAGNOSIS — Z7982 Long term (current) use of aspirin: Secondary | ICD-10-CM | POA: Diagnosis not present

## 2022-08-07 DIAGNOSIS — F32A Depression, unspecified: Secondary | ICD-10-CM | POA: Diagnosis not present

## 2022-08-07 DIAGNOSIS — D649 Anemia, unspecified: Secondary | ICD-10-CM | POA: Diagnosis not present

## 2022-08-07 DIAGNOSIS — Z79891 Long term (current) use of opiate analgesic: Secondary | ICD-10-CM | POA: Diagnosis not present

## 2022-08-07 DIAGNOSIS — G4700 Insomnia, unspecified: Secondary | ICD-10-CM | POA: Diagnosis not present

## 2022-08-07 DIAGNOSIS — I10 Essential (primary) hypertension: Secondary | ICD-10-CM | POA: Diagnosis not present

## 2022-08-07 DIAGNOSIS — K219 Gastro-esophageal reflux disease without esophagitis: Secondary | ICD-10-CM | POA: Diagnosis not present

## 2022-08-07 DIAGNOSIS — Z9181 History of falling: Secondary | ICD-10-CM | POA: Diagnosis not present

## 2022-08-07 DIAGNOSIS — Z791 Long term (current) use of non-steroidal anti-inflammatories (NSAID): Secondary | ICD-10-CM | POA: Diagnosis not present

## 2022-08-07 DIAGNOSIS — M4316 Spondylolisthesis, lumbar region: Secondary | ICD-10-CM | POA: Diagnosis not present

## 2022-08-07 DIAGNOSIS — J45909 Unspecified asthma, uncomplicated: Secondary | ICD-10-CM | POA: Diagnosis not present

## 2022-08-07 DIAGNOSIS — T8142XA Infection following a procedure, deep incisional surgical site, initial encounter: Secondary | ICD-10-CM | POA: Diagnosis not present

## 2022-08-07 DIAGNOSIS — K589 Irritable bowel syndrome without diarrhea: Secondary | ICD-10-CM | POA: Diagnosis not present

## 2022-08-07 DIAGNOSIS — M797 Fibromyalgia: Secondary | ICD-10-CM | POA: Diagnosis not present

## 2022-08-07 DIAGNOSIS — E039 Hypothyroidism, unspecified: Secondary | ICD-10-CM | POA: Diagnosis not present

## 2022-08-07 DIAGNOSIS — Z87440 Personal history of urinary (tract) infections: Secondary | ICD-10-CM | POA: Diagnosis not present

## 2022-08-09 DIAGNOSIS — Z7982 Long term (current) use of aspirin: Secondary | ICD-10-CM | POA: Diagnosis not present

## 2022-08-09 DIAGNOSIS — M4316 Spondylolisthesis, lumbar region: Secondary | ICD-10-CM | POA: Diagnosis not present

## 2022-08-09 DIAGNOSIS — Z9181 History of falling: Secondary | ICD-10-CM | POA: Diagnosis not present

## 2022-08-09 DIAGNOSIS — Z7951 Long term (current) use of inhaled steroids: Secondary | ICD-10-CM | POA: Diagnosis not present

## 2022-08-09 DIAGNOSIS — I10 Essential (primary) hypertension: Secondary | ICD-10-CM | POA: Diagnosis not present

## 2022-08-09 DIAGNOSIS — K589 Irritable bowel syndrome without diarrhea: Secondary | ICD-10-CM | POA: Diagnosis not present

## 2022-08-09 DIAGNOSIS — Z87891 Personal history of nicotine dependence: Secondary | ICD-10-CM | POA: Diagnosis not present

## 2022-08-09 DIAGNOSIS — G4700 Insomnia, unspecified: Secondary | ICD-10-CM | POA: Diagnosis not present

## 2022-08-09 DIAGNOSIS — T8142XA Infection following a procedure, deep incisional surgical site, initial encounter: Secondary | ICD-10-CM | POA: Diagnosis not present

## 2022-08-09 DIAGNOSIS — E039 Hypothyroidism, unspecified: Secondary | ICD-10-CM | POA: Diagnosis not present

## 2022-08-09 DIAGNOSIS — Z791 Long term (current) use of non-steroidal anti-inflammatories (NSAID): Secondary | ICD-10-CM | POA: Diagnosis not present

## 2022-08-09 DIAGNOSIS — M797 Fibromyalgia: Secondary | ICD-10-CM | POA: Diagnosis not present

## 2022-08-09 DIAGNOSIS — J45909 Unspecified asthma, uncomplicated: Secondary | ICD-10-CM | POA: Diagnosis not present

## 2022-08-09 DIAGNOSIS — Z87440 Personal history of urinary (tract) infections: Secondary | ICD-10-CM | POA: Diagnosis not present

## 2022-08-09 DIAGNOSIS — B9689 Other specified bacterial agents as the cause of diseases classified elsewhere: Secondary | ICD-10-CM | POA: Diagnosis not present

## 2022-08-09 DIAGNOSIS — K219 Gastro-esophageal reflux disease without esophagitis: Secondary | ICD-10-CM | POA: Diagnosis not present

## 2022-08-09 DIAGNOSIS — D649 Anemia, unspecified: Secondary | ICD-10-CM | POA: Diagnosis not present

## 2022-08-09 DIAGNOSIS — Z79891 Long term (current) use of opiate analgesic: Secondary | ICD-10-CM | POA: Diagnosis not present

## 2022-08-09 DIAGNOSIS — F32A Depression, unspecified: Secondary | ICD-10-CM | POA: Diagnosis not present

## 2022-08-10 DIAGNOSIS — M545 Low back pain, unspecified: Secondary | ICD-10-CM | POA: Diagnosis not present

## 2022-08-10 DIAGNOSIS — M25562 Pain in left knee: Secondary | ICD-10-CM | POA: Diagnosis not present

## 2022-08-10 DIAGNOSIS — M25561 Pain in right knee: Secondary | ICD-10-CM | POA: Diagnosis not present

## 2022-08-10 DIAGNOSIS — M797 Fibromyalgia: Secondary | ICD-10-CM | POA: Diagnosis not present

## 2022-08-10 DIAGNOSIS — Z9181 History of falling: Secondary | ICD-10-CM | POA: Diagnosis not present

## 2022-08-10 DIAGNOSIS — F32A Depression, unspecified: Secondary | ICD-10-CM | POA: Diagnosis not present

## 2022-08-10 DIAGNOSIS — M62838 Other muscle spasm: Secondary | ICD-10-CM | POA: Diagnosis not present

## 2022-08-10 DIAGNOSIS — M542 Cervicalgia: Secondary | ICD-10-CM | POA: Diagnosis not present

## 2022-08-10 DIAGNOSIS — G8929 Other chronic pain: Secondary | ICD-10-CM | POA: Diagnosis not present

## 2022-08-10 DIAGNOSIS — Z79899 Other long term (current) drug therapy: Secondary | ICD-10-CM | POA: Diagnosis not present

## 2022-08-12 DIAGNOSIS — Z87440 Personal history of urinary (tract) infections: Secondary | ICD-10-CM | POA: Diagnosis not present

## 2022-08-12 DIAGNOSIS — M797 Fibromyalgia: Secondary | ICD-10-CM | POA: Diagnosis not present

## 2022-08-12 DIAGNOSIS — J45909 Unspecified asthma, uncomplicated: Secondary | ICD-10-CM | POA: Diagnosis not present

## 2022-08-12 DIAGNOSIS — Z7951 Long term (current) use of inhaled steroids: Secondary | ICD-10-CM | POA: Diagnosis not present

## 2022-08-12 DIAGNOSIS — Z9181 History of falling: Secondary | ICD-10-CM | POA: Diagnosis not present

## 2022-08-12 DIAGNOSIS — Z7982 Long term (current) use of aspirin: Secondary | ICD-10-CM | POA: Diagnosis not present

## 2022-08-12 DIAGNOSIS — D649 Anemia, unspecified: Secondary | ICD-10-CM | POA: Diagnosis not present

## 2022-08-12 DIAGNOSIS — M4316 Spondylolisthesis, lumbar region: Secondary | ICD-10-CM | POA: Diagnosis not present

## 2022-08-12 DIAGNOSIS — K589 Irritable bowel syndrome without diarrhea: Secondary | ICD-10-CM | POA: Diagnosis not present

## 2022-08-12 DIAGNOSIS — B9689 Other specified bacterial agents as the cause of diseases classified elsewhere: Secondary | ICD-10-CM | POA: Diagnosis not present

## 2022-08-12 DIAGNOSIS — Z79891 Long term (current) use of opiate analgesic: Secondary | ICD-10-CM | POA: Diagnosis not present

## 2022-08-12 DIAGNOSIS — K219 Gastro-esophageal reflux disease without esophagitis: Secondary | ICD-10-CM | POA: Diagnosis not present

## 2022-08-12 DIAGNOSIS — E039 Hypothyroidism, unspecified: Secondary | ICD-10-CM | POA: Diagnosis not present

## 2022-08-12 DIAGNOSIS — Z87891 Personal history of nicotine dependence: Secondary | ICD-10-CM | POA: Diagnosis not present

## 2022-08-12 DIAGNOSIS — I10 Essential (primary) hypertension: Secondary | ICD-10-CM | POA: Diagnosis not present

## 2022-08-12 DIAGNOSIS — Z791 Long term (current) use of non-steroidal anti-inflammatories (NSAID): Secondary | ICD-10-CM | POA: Diagnosis not present

## 2022-08-12 DIAGNOSIS — T8142XA Infection following a procedure, deep incisional surgical site, initial encounter: Secondary | ICD-10-CM | POA: Diagnosis not present

## 2022-08-12 DIAGNOSIS — G4700 Insomnia, unspecified: Secondary | ICD-10-CM | POA: Diagnosis not present

## 2022-08-12 DIAGNOSIS — F32A Depression, unspecified: Secondary | ICD-10-CM | POA: Diagnosis not present

## 2022-08-13 DIAGNOSIS — Z79899 Other long term (current) drug therapy: Secondary | ICD-10-CM | POA: Diagnosis not present

## 2022-08-14 DIAGNOSIS — Z7982 Long term (current) use of aspirin: Secondary | ICD-10-CM | POA: Diagnosis not present

## 2022-08-14 DIAGNOSIS — Z79891 Long term (current) use of opiate analgesic: Secondary | ICD-10-CM | POA: Diagnosis not present

## 2022-08-14 DIAGNOSIS — Z87891 Personal history of nicotine dependence: Secondary | ICD-10-CM | POA: Diagnosis not present

## 2022-08-14 DIAGNOSIS — K589 Irritable bowel syndrome without diarrhea: Secondary | ICD-10-CM | POA: Diagnosis not present

## 2022-08-14 DIAGNOSIS — Z791 Long term (current) use of non-steroidal anti-inflammatories (NSAID): Secondary | ICD-10-CM | POA: Diagnosis not present

## 2022-08-14 DIAGNOSIS — F32A Depression, unspecified: Secondary | ICD-10-CM | POA: Diagnosis not present

## 2022-08-14 DIAGNOSIS — K219 Gastro-esophageal reflux disease without esophagitis: Secondary | ICD-10-CM | POA: Diagnosis not present

## 2022-08-14 DIAGNOSIS — G4700 Insomnia, unspecified: Secondary | ICD-10-CM | POA: Diagnosis not present

## 2022-08-14 DIAGNOSIS — B9689 Other specified bacterial agents as the cause of diseases classified elsewhere: Secondary | ICD-10-CM | POA: Diagnosis not present

## 2022-08-14 DIAGNOSIS — Z9181 History of falling: Secondary | ICD-10-CM | POA: Diagnosis not present

## 2022-08-14 DIAGNOSIS — Z7951 Long term (current) use of inhaled steroids: Secondary | ICD-10-CM | POA: Diagnosis not present

## 2022-08-14 DIAGNOSIS — M797 Fibromyalgia: Secondary | ICD-10-CM | POA: Diagnosis not present

## 2022-08-14 DIAGNOSIS — J45909 Unspecified asthma, uncomplicated: Secondary | ICD-10-CM | POA: Diagnosis not present

## 2022-08-14 DIAGNOSIS — Z87440 Personal history of urinary (tract) infections: Secondary | ICD-10-CM | POA: Diagnosis not present

## 2022-08-14 DIAGNOSIS — E039 Hypothyroidism, unspecified: Secondary | ICD-10-CM | POA: Diagnosis not present

## 2022-08-14 DIAGNOSIS — I10 Essential (primary) hypertension: Secondary | ICD-10-CM | POA: Diagnosis not present

## 2022-08-14 DIAGNOSIS — D649 Anemia, unspecified: Secondary | ICD-10-CM | POA: Diagnosis not present

## 2022-08-14 DIAGNOSIS — T8142XA Infection following a procedure, deep incisional surgical site, initial encounter: Secondary | ICD-10-CM | POA: Diagnosis not present

## 2022-08-14 DIAGNOSIS — M4316 Spondylolisthesis, lumbar region: Secondary | ICD-10-CM | POA: Diagnosis not present

## 2022-08-15 DIAGNOSIS — Z7951 Long term (current) use of inhaled steroids: Secondary | ICD-10-CM | POA: Diagnosis not present

## 2022-08-15 DIAGNOSIS — Z87440 Personal history of urinary (tract) infections: Secondary | ICD-10-CM | POA: Diagnosis not present

## 2022-08-15 DIAGNOSIS — E039 Hypothyroidism, unspecified: Secondary | ICD-10-CM | POA: Diagnosis not present

## 2022-08-15 DIAGNOSIS — D649 Anemia, unspecified: Secondary | ICD-10-CM | POA: Diagnosis not present

## 2022-08-15 DIAGNOSIS — J45909 Unspecified asthma, uncomplicated: Secondary | ICD-10-CM | POA: Diagnosis not present

## 2022-08-15 DIAGNOSIS — Z79891 Long term (current) use of opiate analgesic: Secondary | ICD-10-CM | POA: Diagnosis not present

## 2022-08-15 DIAGNOSIS — G4700 Insomnia, unspecified: Secondary | ICD-10-CM | POA: Diagnosis not present

## 2022-08-15 DIAGNOSIS — F32A Depression, unspecified: Secondary | ICD-10-CM | POA: Diagnosis not present

## 2022-08-15 DIAGNOSIS — M797 Fibromyalgia: Secondary | ICD-10-CM | POA: Diagnosis not present

## 2022-08-15 DIAGNOSIS — Z791 Long term (current) use of non-steroidal anti-inflammatories (NSAID): Secondary | ICD-10-CM | POA: Diagnosis not present

## 2022-08-15 DIAGNOSIS — K219 Gastro-esophageal reflux disease without esophagitis: Secondary | ICD-10-CM | POA: Diagnosis not present

## 2022-08-15 DIAGNOSIS — I10 Essential (primary) hypertension: Secondary | ICD-10-CM | POA: Diagnosis not present

## 2022-08-15 DIAGNOSIS — Z7982 Long term (current) use of aspirin: Secondary | ICD-10-CM | POA: Diagnosis not present

## 2022-08-15 DIAGNOSIS — B9689 Other specified bacterial agents as the cause of diseases classified elsewhere: Secondary | ICD-10-CM | POA: Diagnosis not present

## 2022-08-15 DIAGNOSIS — T8142XA Infection following a procedure, deep incisional surgical site, initial encounter: Secondary | ICD-10-CM | POA: Diagnosis not present

## 2022-08-15 DIAGNOSIS — M4316 Spondylolisthesis, lumbar region: Secondary | ICD-10-CM | POA: Diagnosis not present

## 2022-08-15 DIAGNOSIS — Z9181 History of falling: Secondary | ICD-10-CM | POA: Diagnosis not present

## 2022-08-15 DIAGNOSIS — Z87891 Personal history of nicotine dependence: Secondary | ICD-10-CM | POA: Diagnosis not present

## 2022-08-15 DIAGNOSIS — K589 Irritable bowel syndrome without diarrhea: Secondary | ICD-10-CM | POA: Diagnosis not present

## 2022-08-16 DIAGNOSIS — T8142XA Infection following a procedure, deep incisional surgical site, initial encounter: Secondary | ICD-10-CM | POA: Diagnosis not present

## 2022-08-16 DIAGNOSIS — I10 Essential (primary) hypertension: Secondary | ICD-10-CM | POA: Diagnosis not present

## 2022-08-16 DIAGNOSIS — K219 Gastro-esophageal reflux disease without esophagitis: Secondary | ICD-10-CM | POA: Diagnosis not present

## 2022-08-16 DIAGNOSIS — F32A Depression, unspecified: Secondary | ICD-10-CM | POA: Diagnosis not present

## 2022-08-16 DIAGNOSIS — Z7951 Long term (current) use of inhaled steroids: Secondary | ICD-10-CM | POA: Diagnosis not present

## 2022-08-16 DIAGNOSIS — M4316 Spondylolisthesis, lumbar region: Secondary | ICD-10-CM | POA: Diagnosis not present

## 2022-08-16 DIAGNOSIS — M797 Fibromyalgia: Secondary | ICD-10-CM | POA: Diagnosis not present

## 2022-08-16 DIAGNOSIS — Z7982 Long term (current) use of aspirin: Secondary | ICD-10-CM | POA: Diagnosis not present

## 2022-08-16 DIAGNOSIS — Z87891 Personal history of nicotine dependence: Secondary | ICD-10-CM | POA: Diagnosis not present

## 2022-08-16 DIAGNOSIS — G4700 Insomnia, unspecified: Secondary | ICD-10-CM | POA: Diagnosis not present

## 2022-08-16 DIAGNOSIS — D649 Anemia, unspecified: Secondary | ICD-10-CM | POA: Diagnosis not present

## 2022-08-16 DIAGNOSIS — E039 Hypothyroidism, unspecified: Secondary | ICD-10-CM | POA: Diagnosis not present

## 2022-08-16 DIAGNOSIS — Z791 Long term (current) use of non-steroidal anti-inflammatories (NSAID): Secondary | ICD-10-CM | POA: Diagnosis not present

## 2022-08-16 DIAGNOSIS — Z9181 History of falling: Secondary | ICD-10-CM | POA: Diagnosis not present

## 2022-08-16 DIAGNOSIS — Z87440 Personal history of urinary (tract) infections: Secondary | ICD-10-CM | POA: Diagnosis not present

## 2022-08-16 DIAGNOSIS — Z79891 Long term (current) use of opiate analgesic: Secondary | ICD-10-CM | POA: Diagnosis not present

## 2022-08-16 DIAGNOSIS — K589 Irritable bowel syndrome without diarrhea: Secondary | ICD-10-CM | POA: Diagnosis not present

## 2022-08-16 DIAGNOSIS — J45909 Unspecified asthma, uncomplicated: Secondary | ICD-10-CM | POA: Diagnosis not present

## 2022-08-16 DIAGNOSIS — B9689 Other specified bacterial agents as the cause of diseases classified elsewhere: Secondary | ICD-10-CM | POA: Diagnosis not present

## 2022-08-19 DIAGNOSIS — Z9181 History of falling: Secondary | ICD-10-CM | POA: Diagnosis not present

## 2022-08-19 DIAGNOSIS — Z7951 Long term (current) use of inhaled steroids: Secondary | ICD-10-CM | POA: Diagnosis not present

## 2022-08-19 DIAGNOSIS — E039 Hypothyroidism, unspecified: Secondary | ICD-10-CM | POA: Diagnosis not present

## 2022-08-19 DIAGNOSIS — Z7982 Long term (current) use of aspirin: Secondary | ICD-10-CM | POA: Diagnosis not present

## 2022-08-19 DIAGNOSIS — K219 Gastro-esophageal reflux disease without esophagitis: Secondary | ICD-10-CM | POA: Diagnosis not present

## 2022-08-19 DIAGNOSIS — Z791 Long term (current) use of non-steroidal anti-inflammatories (NSAID): Secondary | ICD-10-CM | POA: Diagnosis not present

## 2022-08-19 DIAGNOSIS — J45909 Unspecified asthma, uncomplicated: Secondary | ICD-10-CM | POA: Diagnosis not present

## 2022-08-19 DIAGNOSIS — K589 Irritable bowel syndrome without diarrhea: Secondary | ICD-10-CM | POA: Diagnosis not present

## 2022-08-19 DIAGNOSIS — T8142XA Infection following a procedure, deep incisional surgical site, initial encounter: Secondary | ICD-10-CM | POA: Diagnosis not present

## 2022-08-19 DIAGNOSIS — I10 Essential (primary) hypertension: Secondary | ICD-10-CM | POA: Diagnosis not present

## 2022-08-19 DIAGNOSIS — B9689 Other specified bacterial agents as the cause of diseases classified elsewhere: Secondary | ICD-10-CM | POA: Diagnosis not present

## 2022-08-19 DIAGNOSIS — F32A Depression, unspecified: Secondary | ICD-10-CM | POA: Diagnosis not present

## 2022-08-19 DIAGNOSIS — Z87891 Personal history of nicotine dependence: Secondary | ICD-10-CM | POA: Diagnosis not present

## 2022-08-19 DIAGNOSIS — Z87440 Personal history of urinary (tract) infections: Secondary | ICD-10-CM | POA: Diagnosis not present

## 2022-08-19 DIAGNOSIS — Z79891 Long term (current) use of opiate analgesic: Secondary | ICD-10-CM | POA: Diagnosis not present

## 2022-08-19 DIAGNOSIS — D649 Anemia, unspecified: Secondary | ICD-10-CM | POA: Diagnosis not present

## 2022-08-19 DIAGNOSIS — M4316 Spondylolisthesis, lumbar region: Secondary | ICD-10-CM | POA: Diagnosis not present

## 2022-08-19 DIAGNOSIS — G4700 Insomnia, unspecified: Secondary | ICD-10-CM | POA: Diagnosis not present

## 2022-08-19 DIAGNOSIS — M797 Fibromyalgia: Secondary | ICD-10-CM | POA: Diagnosis not present

## 2022-08-21 DIAGNOSIS — J45909 Unspecified asthma, uncomplicated: Secondary | ICD-10-CM | POA: Diagnosis not present

## 2022-08-21 DIAGNOSIS — Z87440 Personal history of urinary (tract) infections: Secondary | ICD-10-CM | POA: Diagnosis not present

## 2022-08-21 DIAGNOSIS — Z7982 Long term (current) use of aspirin: Secondary | ICD-10-CM | POA: Diagnosis not present

## 2022-08-21 DIAGNOSIS — D649 Anemia, unspecified: Secondary | ICD-10-CM | POA: Diagnosis not present

## 2022-08-21 DIAGNOSIS — K589 Irritable bowel syndrome without diarrhea: Secondary | ICD-10-CM | POA: Diagnosis not present

## 2022-08-21 DIAGNOSIS — Z7951 Long term (current) use of inhaled steroids: Secondary | ICD-10-CM | POA: Diagnosis not present

## 2022-08-21 DIAGNOSIS — Z87891 Personal history of nicotine dependence: Secondary | ICD-10-CM | POA: Diagnosis not present

## 2022-08-21 DIAGNOSIS — M4316 Spondylolisthesis, lumbar region: Secondary | ICD-10-CM | POA: Diagnosis not present

## 2022-08-21 DIAGNOSIS — I10 Essential (primary) hypertension: Secondary | ICD-10-CM | POA: Diagnosis not present

## 2022-08-21 DIAGNOSIS — T8142XA Infection following a procedure, deep incisional surgical site, initial encounter: Secondary | ICD-10-CM | POA: Diagnosis not present

## 2022-08-21 DIAGNOSIS — B9689 Other specified bacterial agents as the cause of diseases classified elsewhere: Secondary | ICD-10-CM | POA: Diagnosis not present

## 2022-08-21 DIAGNOSIS — Z9181 History of falling: Secondary | ICD-10-CM | POA: Diagnosis not present

## 2022-08-21 DIAGNOSIS — F32A Depression, unspecified: Secondary | ICD-10-CM | POA: Diagnosis not present

## 2022-08-21 DIAGNOSIS — K219 Gastro-esophageal reflux disease without esophagitis: Secondary | ICD-10-CM | POA: Diagnosis not present

## 2022-08-21 DIAGNOSIS — E039 Hypothyroidism, unspecified: Secondary | ICD-10-CM | POA: Diagnosis not present

## 2022-08-21 DIAGNOSIS — Z791 Long term (current) use of non-steroidal anti-inflammatories (NSAID): Secondary | ICD-10-CM | POA: Diagnosis not present

## 2022-08-21 DIAGNOSIS — Z79891 Long term (current) use of opiate analgesic: Secondary | ICD-10-CM | POA: Diagnosis not present

## 2022-08-21 DIAGNOSIS — G4700 Insomnia, unspecified: Secondary | ICD-10-CM | POA: Diagnosis not present

## 2022-08-21 DIAGNOSIS — M797 Fibromyalgia: Secondary | ICD-10-CM | POA: Diagnosis not present

## 2022-08-23 DIAGNOSIS — E039 Hypothyroidism, unspecified: Secondary | ICD-10-CM | POA: Diagnosis not present

## 2022-08-23 DIAGNOSIS — M4316 Spondylolisthesis, lumbar region: Secondary | ICD-10-CM | POA: Diagnosis not present

## 2022-08-23 DIAGNOSIS — Z87891 Personal history of nicotine dependence: Secondary | ICD-10-CM | POA: Diagnosis not present

## 2022-08-23 DIAGNOSIS — D649 Anemia, unspecified: Secondary | ICD-10-CM | POA: Diagnosis not present

## 2022-08-23 DIAGNOSIS — T8142XA Infection following a procedure, deep incisional surgical site, initial encounter: Secondary | ICD-10-CM | POA: Diagnosis not present

## 2022-08-23 DIAGNOSIS — B9689 Other specified bacterial agents as the cause of diseases classified elsewhere: Secondary | ICD-10-CM | POA: Diagnosis not present

## 2022-08-23 DIAGNOSIS — K589 Irritable bowel syndrome without diarrhea: Secondary | ICD-10-CM | POA: Diagnosis not present

## 2022-08-23 DIAGNOSIS — J45909 Unspecified asthma, uncomplicated: Secondary | ICD-10-CM | POA: Diagnosis not present

## 2022-08-23 DIAGNOSIS — Z87440 Personal history of urinary (tract) infections: Secondary | ICD-10-CM | POA: Diagnosis not present

## 2022-08-23 DIAGNOSIS — K219 Gastro-esophageal reflux disease without esophagitis: Secondary | ICD-10-CM | POA: Diagnosis not present

## 2022-08-23 DIAGNOSIS — I10 Essential (primary) hypertension: Secondary | ICD-10-CM | POA: Diagnosis not present

## 2022-08-23 DIAGNOSIS — Z79891 Long term (current) use of opiate analgesic: Secondary | ICD-10-CM | POA: Diagnosis not present

## 2022-08-23 DIAGNOSIS — Z7951 Long term (current) use of inhaled steroids: Secondary | ICD-10-CM | POA: Diagnosis not present

## 2022-08-23 DIAGNOSIS — Z7982 Long term (current) use of aspirin: Secondary | ICD-10-CM | POA: Diagnosis not present

## 2022-08-23 DIAGNOSIS — Z9181 History of falling: Secondary | ICD-10-CM | POA: Diagnosis not present

## 2022-08-23 DIAGNOSIS — M797 Fibromyalgia: Secondary | ICD-10-CM | POA: Diagnosis not present

## 2022-08-23 DIAGNOSIS — G4700 Insomnia, unspecified: Secondary | ICD-10-CM | POA: Diagnosis not present

## 2022-08-23 DIAGNOSIS — F32A Depression, unspecified: Secondary | ICD-10-CM | POA: Diagnosis not present

## 2022-08-23 DIAGNOSIS — Z791 Long term (current) use of non-steroidal anti-inflammatories (NSAID): Secondary | ICD-10-CM | POA: Diagnosis not present

## 2022-08-26 DIAGNOSIS — D649 Anemia, unspecified: Secondary | ICD-10-CM | POA: Diagnosis not present

## 2022-08-26 DIAGNOSIS — B9689 Other specified bacterial agents as the cause of diseases classified elsewhere: Secondary | ICD-10-CM | POA: Diagnosis not present

## 2022-08-26 DIAGNOSIS — Z79891 Long term (current) use of opiate analgesic: Secondary | ICD-10-CM | POA: Diagnosis not present

## 2022-08-26 DIAGNOSIS — G4700 Insomnia, unspecified: Secondary | ICD-10-CM | POA: Diagnosis not present

## 2022-08-26 DIAGNOSIS — Z7982 Long term (current) use of aspirin: Secondary | ICD-10-CM | POA: Diagnosis not present

## 2022-08-26 DIAGNOSIS — M797 Fibromyalgia: Secondary | ICD-10-CM | POA: Diagnosis not present

## 2022-08-26 DIAGNOSIS — E039 Hypothyroidism, unspecified: Secondary | ICD-10-CM | POA: Diagnosis not present

## 2022-08-26 DIAGNOSIS — Z7951 Long term (current) use of inhaled steroids: Secondary | ICD-10-CM | POA: Diagnosis not present

## 2022-08-26 DIAGNOSIS — M4316 Spondylolisthesis, lumbar region: Secondary | ICD-10-CM | POA: Diagnosis not present

## 2022-08-26 DIAGNOSIS — Z791 Long term (current) use of non-steroidal anti-inflammatories (NSAID): Secondary | ICD-10-CM | POA: Diagnosis not present

## 2022-08-26 DIAGNOSIS — Z87440 Personal history of urinary (tract) infections: Secondary | ICD-10-CM | POA: Diagnosis not present

## 2022-08-26 DIAGNOSIS — T8142XA Infection following a procedure, deep incisional surgical site, initial encounter: Secondary | ICD-10-CM | POA: Diagnosis not present

## 2022-08-26 DIAGNOSIS — I10 Essential (primary) hypertension: Secondary | ICD-10-CM | POA: Diagnosis not present

## 2022-08-26 DIAGNOSIS — Z87891 Personal history of nicotine dependence: Secondary | ICD-10-CM | POA: Diagnosis not present

## 2022-08-26 DIAGNOSIS — Z9181 History of falling: Secondary | ICD-10-CM | POA: Diagnosis not present

## 2022-08-26 DIAGNOSIS — K219 Gastro-esophageal reflux disease without esophagitis: Secondary | ICD-10-CM | POA: Diagnosis not present

## 2022-08-26 DIAGNOSIS — K589 Irritable bowel syndrome without diarrhea: Secondary | ICD-10-CM | POA: Diagnosis not present

## 2022-08-26 DIAGNOSIS — F32A Depression, unspecified: Secondary | ICD-10-CM | POA: Diagnosis not present

## 2022-08-26 DIAGNOSIS — J45909 Unspecified asthma, uncomplicated: Secondary | ICD-10-CM | POA: Diagnosis not present

## 2022-08-27 DIAGNOSIS — Z791 Long term (current) use of non-steroidal anti-inflammatories (NSAID): Secondary | ICD-10-CM | POA: Diagnosis not present

## 2022-08-27 DIAGNOSIS — D649 Anemia, unspecified: Secondary | ICD-10-CM | POA: Diagnosis not present

## 2022-08-27 DIAGNOSIS — M797 Fibromyalgia: Secondary | ICD-10-CM | POA: Diagnosis not present

## 2022-08-27 DIAGNOSIS — I10 Essential (primary) hypertension: Secondary | ICD-10-CM | POA: Diagnosis not present

## 2022-08-27 DIAGNOSIS — Z9181 History of falling: Secondary | ICD-10-CM | POA: Diagnosis not present

## 2022-08-27 DIAGNOSIS — Z7982 Long term (current) use of aspirin: Secondary | ICD-10-CM | POA: Diagnosis not present

## 2022-08-27 DIAGNOSIS — G4700 Insomnia, unspecified: Secondary | ICD-10-CM | POA: Diagnosis not present

## 2022-08-27 DIAGNOSIS — M4316 Spondylolisthesis, lumbar region: Secondary | ICD-10-CM | POA: Diagnosis not present

## 2022-08-27 DIAGNOSIS — T8142XA Infection following a procedure, deep incisional surgical site, initial encounter: Secondary | ICD-10-CM | POA: Diagnosis not present

## 2022-08-27 DIAGNOSIS — K219 Gastro-esophageal reflux disease without esophagitis: Secondary | ICD-10-CM | POA: Diagnosis not present

## 2022-08-27 DIAGNOSIS — Z7951 Long term (current) use of inhaled steroids: Secondary | ICD-10-CM | POA: Diagnosis not present

## 2022-08-27 DIAGNOSIS — J45909 Unspecified asthma, uncomplicated: Secondary | ICD-10-CM | POA: Diagnosis not present

## 2022-08-27 DIAGNOSIS — Z87891 Personal history of nicotine dependence: Secondary | ICD-10-CM | POA: Diagnosis not present

## 2022-08-27 DIAGNOSIS — Z79891 Long term (current) use of opiate analgesic: Secondary | ICD-10-CM | POA: Diagnosis not present

## 2022-08-27 DIAGNOSIS — F32A Depression, unspecified: Secondary | ICD-10-CM | POA: Diagnosis not present

## 2022-08-27 DIAGNOSIS — B9689 Other specified bacterial agents as the cause of diseases classified elsewhere: Secondary | ICD-10-CM | POA: Diagnosis not present

## 2022-08-27 DIAGNOSIS — Z87440 Personal history of urinary (tract) infections: Secondary | ICD-10-CM | POA: Diagnosis not present

## 2022-08-27 DIAGNOSIS — K589 Irritable bowel syndrome without diarrhea: Secondary | ICD-10-CM | POA: Diagnosis not present

## 2022-08-27 DIAGNOSIS — E039 Hypothyroidism, unspecified: Secondary | ICD-10-CM | POA: Diagnosis not present

## 2022-08-28 DIAGNOSIS — T8142XA Infection following a procedure, deep incisional surgical site, initial encounter: Secondary | ICD-10-CM | POA: Diagnosis not present

## 2022-08-28 DIAGNOSIS — K219 Gastro-esophageal reflux disease without esophagitis: Secondary | ICD-10-CM | POA: Diagnosis not present

## 2022-08-28 DIAGNOSIS — Z9181 History of falling: Secondary | ICD-10-CM | POA: Diagnosis not present

## 2022-08-28 DIAGNOSIS — G4700 Insomnia, unspecified: Secondary | ICD-10-CM | POA: Diagnosis not present

## 2022-08-28 DIAGNOSIS — K589 Irritable bowel syndrome without diarrhea: Secondary | ICD-10-CM | POA: Diagnosis not present

## 2022-08-28 DIAGNOSIS — M797 Fibromyalgia: Secondary | ICD-10-CM | POA: Diagnosis not present

## 2022-08-28 DIAGNOSIS — Z87891 Personal history of nicotine dependence: Secondary | ICD-10-CM | POA: Diagnosis not present

## 2022-08-28 DIAGNOSIS — Z7951 Long term (current) use of inhaled steroids: Secondary | ICD-10-CM | POA: Diagnosis not present

## 2022-08-28 DIAGNOSIS — E039 Hypothyroidism, unspecified: Secondary | ICD-10-CM | POA: Diagnosis not present

## 2022-08-28 DIAGNOSIS — M4316 Spondylolisthesis, lumbar region: Secondary | ICD-10-CM | POA: Diagnosis not present

## 2022-08-28 DIAGNOSIS — Z87440 Personal history of urinary (tract) infections: Secondary | ICD-10-CM | POA: Diagnosis not present

## 2022-08-28 DIAGNOSIS — Z7982 Long term (current) use of aspirin: Secondary | ICD-10-CM | POA: Diagnosis not present

## 2022-08-28 DIAGNOSIS — I10 Essential (primary) hypertension: Secondary | ICD-10-CM | POA: Diagnosis not present

## 2022-08-28 DIAGNOSIS — D649 Anemia, unspecified: Secondary | ICD-10-CM | POA: Diagnosis not present

## 2022-08-28 DIAGNOSIS — F32A Depression, unspecified: Secondary | ICD-10-CM | POA: Diagnosis not present

## 2022-08-28 DIAGNOSIS — Z791 Long term (current) use of non-steroidal anti-inflammatories (NSAID): Secondary | ICD-10-CM | POA: Diagnosis not present

## 2022-08-28 DIAGNOSIS — J45909 Unspecified asthma, uncomplicated: Secondary | ICD-10-CM | POA: Diagnosis not present

## 2022-08-28 DIAGNOSIS — B9689 Other specified bacterial agents as the cause of diseases classified elsewhere: Secondary | ICD-10-CM | POA: Diagnosis not present

## 2022-08-28 DIAGNOSIS — Z79891 Long term (current) use of opiate analgesic: Secondary | ICD-10-CM | POA: Diagnosis not present

## 2022-08-31 DIAGNOSIS — Z7982 Long term (current) use of aspirin: Secondary | ICD-10-CM | POA: Diagnosis not present

## 2022-08-31 DIAGNOSIS — M797 Fibromyalgia: Secondary | ICD-10-CM | POA: Diagnosis not present

## 2022-08-31 DIAGNOSIS — E039 Hypothyroidism, unspecified: Secondary | ICD-10-CM | POA: Diagnosis not present

## 2022-08-31 DIAGNOSIS — T8142XD Infection following a procedure, deep incisional surgical site, subsequent encounter: Secondary | ICD-10-CM | POA: Diagnosis not present

## 2022-08-31 DIAGNOSIS — G4700 Insomnia, unspecified: Secondary | ICD-10-CM | POA: Diagnosis not present

## 2022-08-31 DIAGNOSIS — Z79891 Long term (current) use of opiate analgesic: Secondary | ICD-10-CM | POA: Diagnosis not present

## 2022-08-31 DIAGNOSIS — F32A Depression, unspecified: Secondary | ICD-10-CM | POA: Diagnosis not present

## 2022-08-31 DIAGNOSIS — Z87891 Personal history of nicotine dependence: Secondary | ICD-10-CM | POA: Diagnosis not present

## 2022-08-31 DIAGNOSIS — K589 Irritable bowel syndrome without diarrhea: Secondary | ICD-10-CM | POA: Diagnosis not present

## 2022-08-31 DIAGNOSIS — J45909 Unspecified asthma, uncomplicated: Secondary | ICD-10-CM | POA: Diagnosis not present

## 2022-08-31 DIAGNOSIS — I10 Essential (primary) hypertension: Secondary | ICD-10-CM | POA: Diagnosis not present

## 2022-08-31 DIAGNOSIS — M4316 Spondylolisthesis, lumbar region: Secondary | ICD-10-CM | POA: Diagnosis not present

## 2022-08-31 DIAGNOSIS — K219 Gastro-esophageal reflux disease without esophagitis: Secondary | ICD-10-CM | POA: Diagnosis not present

## 2022-08-31 DIAGNOSIS — Z87440 Personal history of urinary (tract) infections: Secondary | ICD-10-CM | POA: Diagnosis not present

## 2022-08-31 DIAGNOSIS — B9689 Other specified bacterial agents as the cause of diseases classified elsewhere: Secondary | ICD-10-CM | POA: Diagnosis not present

## 2022-08-31 DIAGNOSIS — Z791 Long term (current) use of non-steroidal anti-inflammatories (NSAID): Secondary | ICD-10-CM | POA: Diagnosis not present

## 2022-08-31 DIAGNOSIS — D649 Anemia, unspecified: Secondary | ICD-10-CM | POA: Diagnosis not present

## 2022-08-31 DIAGNOSIS — Z9181 History of falling: Secondary | ICD-10-CM | POA: Diagnosis not present

## 2022-09-02 DIAGNOSIS — Z7982 Long term (current) use of aspirin: Secondary | ICD-10-CM | POA: Diagnosis not present

## 2022-09-02 DIAGNOSIS — K589 Irritable bowel syndrome without diarrhea: Secondary | ICD-10-CM | POA: Diagnosis not present

## 2022-09-02 DIAGNOSIS — M4316 Spondylolisthesis, lumbar region: Secondary | ICD-10-CM | POA: Diagnosis not present

## 2022-09-02 DIAGNOSIS — Z79891 Long term (current) use of opiate analgesic: Secondary | ICD-10-CM | POA: Diagnosis not present

## 2022-09-02 DIAGNOSIS — F32A Depression, unspecified: Secondary | ICD-10-CM | POA: Diagnosis not present

## 2022-09-02 DIAGNOSIS — Z9181 History of falling: Secondary | ICD-10-CM | POA: Diagnosis not present

## 2022-09-02 DIAGNOSIS — Z87891 Personal history of nicotine dependence: Secondary | ICD-10-CM | POA: Diagnosis not present

## 2022-09-02 DIAGNOSIS — J45909 Unspecified asthma, uncomplicated: Secondary | ICD-10-CM | POA: Diagnosis not present

## 2022-09-02 DIAGNOSIS — E039 Hypothyroidism, unspecified: Secondary | ICD-10-CM | POA: Diagnosis not present

## 2022-09-02 DIAGNOSIS — D649 Anemia, unspecified: Secondary | ICD-10-CM | POA: Diagnosis not present

## 2022-09-02 DIAGNOSIS — G4700 Insomnia, unspecified: Secondary | ICD-10-CM | POA: Diagnosis not present

## 2022-09-02 DIAGNOSIS — I10 Essential (primary) hypertension: Secondary | ICD-10-CM | POA: Diagnosis not present

## 2022-09-02 DIAGNOSIS — K219 Gastro-esophageal reflux disease without esophagitis: Secondary | ICD-10-CM | POA: Diagnosis not present

## 2022-09-02 DIAGNOSIS — Z791 Long term (current) use of non-steroidal anti-inflammatories (NSAID): Secondary | ICD-10-CM | POA: Diagnosis not present

## 2022-09-02 DIAGNOSIS — B9689 Other specified bacterial agents as the cause of diseases classified elsewhere: Secondary | ICD-10-CM | POA: Diagnosis not present

## 2022-09-02 DIAGNOSIS — Z87440 Personal history of urinary (tract) infections: Secondary | ICD-10-CM | POA: Diagnosis not present

## 2022-09-02 DIAGNOSIS — M797 Fibromyalgia: Secondary | ICD-10-CM | POA: Diagnosis not present

## 2022-09-02 DIAGNOSIS — T8142XD Infection following a procedure, deep incisional surgical site, subsequent encounter: Secondary | ICD-10-CM | POA: Diagnosis not present

## 2022-09-03 DIAGNOSIS — G4700 Insomnia, unspecified: Secondary | ICD-10-CM | POA: Diagnosis not present

## 2022-09-03 DIAGNOSIS — Z79891 Long term (current) use of opiate analgesic: Secondary | ICD-10-CM | POA: Diagnosis not present

## 2022-09-03 DIAGNOSIS — K589 Irritable bowel syndrome without diarrhea: Secondary | ICD-10-CM | POA: Diagnosis not present

## 2022-09-03 DIAGNOSIS — K219 Gastro-esophageal reflux disease without esophagitis: Secondary | ICD-10-CM | POA: Diagnosis not present

## 2022-09-03 DIAGNOSIS — Z791 Long term (current) use of non-steroidal anti-inflammatories (NSAID): Secondary | ICD-10-CM | POA: Diagnosis not present

## 2022-09-03 DIAGNOSIS — E039 Hypothyroidism, unspecified: Secondary | ICD-10-CM | POA: Diagnosis not present

## 2022-09-03 DIAGNOSIS — M4316 Spondylolisthesis, lumbar region: Secondary | ICD-10-CM | POA: Diagnosis not present

## 2022-09-03 DIAGNOSIS — J45909 Unspecified asthma, uncomplicated: Secondary | ICD-10-CM | POA: Diagnosis not present

## 2022-09-03 DIAGNOSIS — T8142XD Infection following a procedure, deep incisional surgical site, subsequent encounter: Secondary | ICD-10-CM | POA: Diagnosis not present

## 2022-09-03 DIAGNOSIS — Z87891 Personal history of nicotine dependence: Secondary | ICD-10-CM | POA: Diagnosis not present

## 2022-09-03 DIAGNOSIS — D649 Anemia, unspecified: Secondary | ICD-10-CM | POA: Diagnosis not present

## 2022-09-03 DIAGNOSIS — M797 Fibromyalgia: Secondary | ICD-10-CM | POA: Diagnosis not present

## 2022-09-03 DIAGNOSIS — Z9181 History of falling: Secondary | ICD-10-CM | POA: Diagnosis not present

## 2022-09-03 DIAGNOSIS — I10 Essential (primary) hypertension: Secondary | ICD-10-CM | POA: Diagnosis not present

## 2022-09-03 DIAGNOSIS — F32A Depression, unspecified: Secondary | ICD-10-CM | POA: Diagnosis not present

## 2022-09-03 DIAGNOSIS — Z87440 Personal history of urinary (tract) infections: Secondary | ICD-10-CM | POA: Diagnosis not present

## 2022-09-03 DIAGNOSIS — B9689 Other specified bacterial agents as the cause of diseases classified elsewhere: Secondary | ICD-10-CM | POA: Diagnosis not present

## 2022-09-03 DIAGNOSIS — Z7982 Long term (current) use of aspirin: Secondary | ICD-10-CM | POA: Diagnosis not present

## 2022-09-04 DIAGNOSIS — E039 Hypothyroidism, unspecified: Secondary | ICD-10-CM | POA: Diagnosis not present

## 2022-09-04 DIAGNOSIS — I10 Essential (primary) hypertension: Secondary | ICD-10-CM | POA: Diagnosis not present

## 2022-09-04 DIAGNOSIS — D649 Anemia, unspecified: Secondary | ICD-10-CM | POA: Diagnosis not present

## 2022-09-04 DIAGNOSIS — Z9181 History of falling: Secondary | ICD-10-CM | POA: Diagnosis not present

## 2022-09-04 DIAGNOSIS — T8142XD Infection following a procedure, deep incisional surgical site, subsequent encounter: Secondary | ICD-10-CM | POA: Diagnosis not present

## 2022-09-04 DIAGNOSIS — G4700 Insomnia, unspecified: Secondary | ICD-10-CM | POA: Diagnosis not present

## 2022-09-04 DIAGNOSIS — Z79891 Long term (current) use of opiate analgesic: Secondary | ICD-10-CM | POA: Diagnosis not present

## 2022-09-04 DIAGNOSIS — B9689 Other specified bacterial agents as the cause of diseases classified elsewhere: Secondary | ICD-10-CM | POA: Diagnosis not present

## 2022-09-04 DIAGNOSIS — M4316 Spondylolisthesis, lumbar region: Secondary | ICD-10-CM | POA: Diagnosis not present

## 2022-09-04 DIAGNOSIS — K589 Irritable bowel syndrome without diarrhea: Secondary | ICD-10-CM | POA: Diagnosis not present

## 2022-09-04 DIAGNOSIS — Z87440 Personal history of urinary (tract) infections: Secondary | ICD-10-CM | POA: Diagnosis not present

## 2022-09-04 DIAGNOSIS — J45909 Unspecified asthma, uncomplicated: Secondary | ICD-10-CM | POA: Diagnosis not present

## 2022-09-04 DIAGNOSIS — F32A Depression, unspecified: Secondary | ICD-10-CM | POA: Diagnosis not present

## 2022-09-04 DIAGNOSIS — Z7982 Long term (current) use of aspirin: Secondary | ICD-10-CM | POA: Diagnosis not present

## 2022-09-04 DIAGNOSIS — Z87891 Personal history of nicotine dependence: Secondary | ICD-10-CM | POA: Diagnosis not present

## 2022-09-04 DIAGNOSIS — Z791 Long term (current) use of non-steroidal anti-inflammatories (NSAID): Secondary | ICD-10-CM | POA: Diagnosis not present

## 2022-09-04 DIAGNOSIS — K219 Gastro-esophageal reflux disease without esophagitis: Secondary | ICD-10-CM | POA: Diagnosis not present

## 2022-09-04 DIAGNOSIS — M797 Fibromyalgia: Secondary | ICD-10-CM | POA: Diagnosis not present

## 2022-09-06 DIAGNOSIS — Z9181 History of falling: Secondary | ICD-10-CM | POA: Diagnosis not present

## 2022-09-06 DIAGNOSIS — Z79891 Long term (current) use of opiate analgesic: Secondary | ICD-10-CM | POA: Diagnosis not present

## 2022-09-06 DIAGNOSIS — M4316 Spondylolisthesis, lumbar region: Secondary | ICD-10-CM | POA: Diagnosis not present

## 2022-09-06 DIAGNOSIS — Z87440 Personal history of urinary (tract) infections: Secondary | ICD-10-CM | POA: Diagnosis not present

## 2022-09-06 DIAGNOSIS — J45909 Unspecified asthma, uncomplicated: Secondary | ICD-10-CM | POA: Diagnosis not present

## 2022-09-06 DIAGNOSIS — K219 Gastro-esophageal reflux disease without esophagitis: Secondary | ICD-10-CM | POA: Diagnosis not present

## 2022-09-06 DIAGNOSIS — E039 Hypothyroidism, unspecified: Secondary | ICD-10-CM | POA: Diagnosis not present

## 2022-09-06 DIAGNOSIS — M797 Fibromyalgia: Secondary | ICD-10-CM | POA: Diagnosis not present

## 2022-09-06 DIAGNOSIS — I10 Essential (primary) hypertension: Secondary | ICD-10-CM | POA: Diagnosis not present

## 2022-09-06 DIAGNOSIS — G4700 Insomnia, unspecified: Secondary | ICD-10-CM | POA: Diagnosis not present

## 2022-09-06 DIAGNOSIS — T8142XD Infection following a procedure, deep incisional surgical site, subsequent encounter: Secondary | ICD-10-CM | POA: Diagnosis not present

## 2022-09-06 DIAGNOSIS — F32A Depression, unspecified: Secondary | ICD-10-CM | POA: Diagnosis not present

## 2022-09-06 DIAGNOSIS — K589 Irritable bowel syndrome without diarrhea: Secondary | ICD-10-CM | POA: Diagnosis not present

## 2022-09-06 DIAGNOSIS — Z87891 Personal history of nicotine dependence: Secondary | ICD-10-CM | POA: Diagnosis not present

## 2022-09-06 DIAGNOSIS — B9689 Other specified bacterial agents as the cause of diseases classified elsewhere: Secondary | ICD-10-CM | POA: Diagnosis not present

## 2022-09-06 DIAGNOSIS — D649 Anemia, unspecified: Secondary | ICD-10-CM | POA: Diagnosis not present

## 2022-09-06 DIAGNOSIS — Z791 Long term (current) use of non-steroidal anti-inflammatories (NSAID): Secondary | ICD-10-CM | POA: Diagnosis not present

## 2022-09-06 DIAGNOSIS — Z7982 Long term (current) use of aspirin: Secondary | ICD-10-CM | POA: Diagnosis not present

## 2022-09-07 DIAGNOSIS — G8929 Other chronic pain: Secondary | ICD-10-CM | POA: Diagnosis not present

## 2022-09-07 DIAGNOSIS — E559 Vitamin D deficiency, unspecified: Secondary | ICD-10-CM | POA: Diagnosis not present

## 2022-09-07 DIAGNOSIS — M797 Fibromyalgia: Secondary | ICD-10-CM | POA: Diagnosis not present

## 2022-09-07 DIAGNOSIS — M25561 Pain in right knee: Secondary | ICD-10-CM | POA: Diagnosis not present

## 2022-09-07 DIAGNOSIS — M25562 Pain in left knee: Secondary | ICD-10-CM | POA: Diagnosis not present

## 2022-09-07 DIAGNOSIS — Z79899 Other long term (current) drug therapy: Secondary | ICD-10-CM | POA: Diagnosis not present

## 2022-09-07 DIAGNOSIS — M545 Low back pain, unspecified: Secondary | ICD-10-CM | POA: Diagnosis not present

## 2022-09-07 DIAGNOSIS — F32A Depression, unspecified: Secondary | ICD-10-CM | POA: Diagnosis not present

## 2022-09-07 DIAGNOSIS — G4700 Insomnia, unspecified: Secondary | ICD-10-CM | POA: Diagnosis not present

## 2022-09-07 DIAGNOSIS — Z9181 History of falling: Secondary | ICD-10-CM | POA: Diagnosis not present

## 2022-09-07 DIAGNOSIS — M62838 Other muscle spasm: Secondary | ICD-10-CM | POA: Diagnosis not present

## 2022-09-08 DIAGNOSIS — K219 Gastro-esophageal reflux disease without esophagitis: Secondary | ICD-10-CM | POA: Diagnosis not present

## 2022-09-08 DIAGNOSIS — Z79891 Long term (current) use of opiate analgesic: Secondary | ICD-10-CM | POA: Diagnosis not present

## 2022-09-08 DIAGNOSIS — Z87891 Personal history of nicotine dependence: Secondary | ICD-10-CM | POA: Diagnosis not present

## 2022-09-08 DIAGNOSIS — B9689 Other specified bacterial agents as the cause of diseases classified elsewhere: Secondary | ICD-10-CM | POA: Diagnosis not present

## 2022-09-08 DIAGNOSIS — J45909 Unspecified asthma, uncomplicated: Secondary | ICD-10-CM | POA: Diagnosis not present

## 2022-09-08 DIAGNOSIS — T8142XD Infection following a procedure, deep incisional surgical site, subsequent encounter: Secondary | ICD-10-CM | POA: Diagnosis not present

## 2022-09-08 DIAGNOSIS — D649 Anemia, unspecified: Secondary | ICD-10-CM | POA: Diagnosis not present

## 2022-09-08 DIAGNOSIS — M4316 Spondylolisthesis, lumbar region: Secondary | ICD-10-CM | POA: Diagnosis not present

## 2022-09-08 DIAGNOSIS — F32A Depression, unspecified: Secondary | ICD-10-CM | POA: Diagnosis not present

## 2022-09-08 DIAGNOSIS — I10 Essential (primary) hypertension: Secondary | ICD-10-CM | POA: Diagnosis not present

## 2022-09-08 DIAGNOSIS — Z7982 Long term (current) use of aspirin: Secondary | ICD-10-CM | POA: Diagnosis not present

## 2022-09-08 DIAGNOSIS — Z87440 Personal history of urinary (tract) infections: Secondary | ICD-10-CM | POA: Diagnosis not present

## 2022-09-08 DIAGNOSIS — K589 Irritable bowel syndrome without diarrhea: Secondary | ICD-10-CM | POA: Diagnosis not present

## 2022-09-08 DIAGNOSIS — E039 Hypothyroidism, unspecified: Secondary | ICD-10-CM | POA: Diagnosis not present

## 2022-09-08 DIAGNOSIS — G4700 Insomnia, unspecified: Secondary | ICD-10-CM | POA: Diagnosis not present

## 2022-09-08 DIAGNOSIS — M797 Fibromyalgia: Secondary | ICD-10-CM | POA: Diagnosis not present

## 2022-09-08 DIAGNOSIS — Z791 Long term (current) use of non-steroidal anti-inflammatories (NSAID): Secondary | ICD-10-CM | POA: Diagnosis not present

## 2022-09-08 DIAGNOSIS — Z9181 History of falling: Secondary | ICD-10-CM | POA: Diagnosis not present

## 2022-09-09 DIAGNOSIS — M48062 Spinal stenosis, lumbar region with neurogenic claudication: Secondary | ICD-10-CM | POA: Diagnosis not present

## 2022-09-10 DIAGNOSIS — Z79899 Other long term (current) drug therapy: Secondary | ICD-10-CM | POA: Diagnosis not present

## 2022-09-12 DIAGNOSIS — J45909 Unspecified asthma, uncomplicated: Secondary | ICD-10-CM | POA: Diagnosis not present

## 2022-09-12 DIAGNOSIS — Z9181 History of falling: Secondary | ICD-10-CM | POA: Diagnosis not present

## 2022-09-12 DIAGNOSIS — K219 Gastro-esophageal reflux disease without esophagitis: Secondary | ICD-10-CM | POA: Diagnosis not present

## 2022-09-12 DIAGNOSIS — D649 Anemia, unspecified: Secondary | ICD-10-CM | POA: Diagnosis not present

## 2022-09-12 DIAGNOSIS — Z791 Long term (current) use of non-steroidal anti-inflammatories (NSAID): Secondary | ICD-10-CM | POA: Diagnosis not present

## 2022-09-12 DIAGNOSIS — M797 Fibromyalgia: Secondary | ICD-10-CM | POA: Diagnosis not present

## 2022-09-12 DIAGNOSIS — E039 Hypothyroidism, unspecified: Secondary | ICD-10-CM | POA: Diagnosis not present

## 2022-09-12 DIAGNOSIS — B9689 Other specified bacterial agents as the cause of diseases classified elsewhere: Secondary | ICD-10-CM | POA: Diagnosis not present

## 2022-09-12 DIAGNOSIS — F32A Depression, unspecified: Secondary | ICD-10-CM | POA: Diagnosis not present

## 2022-09-12 DIAGNOSIS — Z87891 Personal history of nicotine dependence: Secondary | ICD-10-CM | POA: Diagnosis not present

## 2022-09-12 DIAGNOSIS — T8142XD Infection following a procedure, deep incisional surgical site, subsequent encounter: Secondary | ICD-10-CM | POA: Diagnosis not present

## 2022-09-12 DIAGNOSIS — Z87440 Personal history of urinary (tract) infections: Secondary | ICD-10-CM | POA: Diagnosis not present

## 2022-09-12 DIAGNOSIS — G4700 Insomnia, unspecified: Secondary | ICD-10-CM | POA: Diagnosis not present

## 2022-09-12 DIAGNOSIS — Z79891 Long term (current) use of opiate analgesic: Secondary | ICD-10-CM | POA: Diagnosis not present

## 2022-09-12 DIAGNOSIS — M4316 Spondylolisthesis, lumbar region: Secondary | ICD-10-CM | POA: Diagnosis not present

## 2022-09-12 DIAGNOSIS — I10 Essential (primary) hypertension: Secondary | ICD-10-CM | POA: Diagnosis not present

## 2022-09-12 DIAGNOSIS — Z7982 Long term (current) use of aspirin: Secondary | ICD-10-CM | POA: Diagnosis not present

## 2022-09-12 DIAGNOSIS — K589 Irritable bowel syndrome without diarrhea: Secondary | ICD-10-CM | POA: Diagnosis not present

## 2022-09-13 DIAGNOSIS — F32A Depression, unspecified: Secondary | ICD-10-CM | POA: Diagnosis not present

## 2022-09-13 DIAGNOSIS — Z7982 Long term (current) use of aspirin: Secondary | ICD-10-CM | POA: Diagnosis not present

## 2022-09-13 DIAGNOSIS — Z87440 Personal history of urinary (tract) infections: Secondary | ICD-10-CM | POA: Diagnosis not present

## 2022-09-13 DIAGNOSIS — G4700 Insomnia, unspecified: Secondary | ICD-10-CM | POA: Diagnosis not present

## 2022-09-13 DIAGNOSIS — D649 Anemia, unspecified: Secondary | ICD-10-CM | POA: Diagnosis not present

## 2022-09-13 DIAGNOSIS — K589 Irritable bowel syndrome without diarrhea: Secondary | ICD-10-CM | POA: Diagnosis not present

## 2022-09-13 DIAGNOSIS — I10 Essential (primary) hypertension: Secondary | ICD-10-CM | POA: Diagnosis not present

## 2022-09-13 DIAGNOSIS — K219 Gastro-esophageal reflux disease without esophagitis: Secondary | ICD-10-CM | POA: Diagnosis not present

## 2022-09-13 DIAGNOSIS — E039 Hypothyroidism, unspecified: Secondary | ICD-10-CM | POA: Diagnosis not present

## 2022-09-13 DIAGNOSIS — Z79891 Long term (current) use of opiate analgesic: Secondary | ICD-10-CM | POA: Diagnosis not present

## 2022-09-13 DIAGNOSIS — B9689 Other specified bacterial agents as the cause of diseases classified elsewhere: Secondary | ICD-10-CM | POA: Diagnosis not present

## 2022-09-13 DIAGNOSIS — Z9181 History of falling: Secondary | ICD-10-CM | POA: Diagnosis not present

## 2022-09-13 DIAGNOSIS — M797 Fibromyalgia: Secondary | ICD-10-CM | POA: Diagnosis not present

## 2022-09-13 DIAGNOSIS — Z87891 Personal history of nicotine dependence: Secondary | ICD-10-CM | POA: Diagnosis not present

## 2022-09-13 DIAGNOSIS — J45909 Unspecified asthma, uncomplicated: Secondary | ICD-10-CM | POA: Diagnosis not present

## 2022-09-13 DIAGNOSIS — Z791 Long term (current) use of non-steroidal anti-inflammatories (NSAID): Secondary | ICD-10-CM | POA: Diagnosis not present

## 2022-09-13 DIAGNOSIS — M4316 Spondylolisthesis, lumbar region: Secondary | ICD-10-CM | POA: Diagnosis not present

## 2022-09-13 DIAGNOSIS — T8142XD Infection following a procedure, deep incisional surgical site, subsequent encounter: Secondary | ICD-10-CM | POA: Diagnosis not present

## 2022-09-16 DIAGNOSIS — Z87440 Personal history of urinary (tract) infections: Secondary | ICD-10-CM | POA: Diagnosis not present

## 2022-09-16 DIAGNOSIS — Z9181 History of falling: Secondary | ICD-10-CM | POA: Diagnosis not present

## 2022-09-16 DIAGNOSIS — G4700 Insomnia, unspecified: Secondary | ICD-10-CM | POA: Diagnosis not present

## 2022-09-16 DIAGNOSIS — J45909 Unspecified asthma, uncomplicated: Secondary | ICD-10-CM | POA: Diagnosis not present

## 2022-09-16 DIAGNOSIS — Z79891 Long term (current) use of opiate analgesic: Secondary | ICD-10-CM | POA: Diagnosis not present

## 2022-09-16 DIAGNOSIS — E039 Hypothyroidism, unspecified: Secondary | ICD-10-CM | POA: Diagnosis not present

## 2022-09-16 DIAGNOSIS — B9689 Other specified bacterial agents as the cause of diseases classified elsewhere: Secondary | ICD-10-CM | POA: Diagnosis not present

## 2022-09-16 DIAGNOSIS — M797 Fibromyalgia: Secondary | ICD-10-CM | POA: Diagnosis not present

## 2022-09-16 DIAGNOSIS — Z87891 Personal history of nicotine dependence: Secondary | ICD-10-CM | POA: Diagnosis not present

## 2022-09-16 DIAGNOSIS — Z791 Long term (current) use of non-steroidal anti-inflammatories (NSAID): Secondary | ICD-10-CM | POA: Diagnosis not present

## 2022-09-16 DIAGNOSIS — M4316 Spondylolisthesis, lumbar region: Secondary | ICD-10-CM | POA: Diagnosis not present

## 2022-09-16 DIAGNOSIS — D649 Anemia, unspecified: Secondary | ICD-10-CM | POA: Diagnosis not present

## 2022-09-16 DIAGNOSIS — K219 Gastro-esophageal reflux disease without esophagitis: Secondary | ICD-10-CM | POA: Diagnosis not present

## 2022-09-16 DIAGNOSIS — F32A Depression, unspecified: Secondary | ICD-10-CM | POA: Diagnosis not present

## 2022-09-16 DIAGNOSIS — I10 Essential (primary) hypertension: Secondary | ICD-10-CM | POA: Diagnosis not present

## 2022-09-16 DIAGNOSIS — K589 Irritable bowel syndrome without diarrhea: Secondary | ICD-10-CM | POA: Diagnosis not present

## 2022-09-16 DIAGNOSIS — Z7982 Long term (current) use of aspirin: Secondary | ICD-10-CM | POA: Diagnosis not present

## 2022-09-16 DIAGNOSIS — T8142XD Infection following a procedure, deep incisional surgical site, subsequent encounter: Secondary | ICD-10-CM | POA: Diagnosis not present

## 2022-09-17 DIAGNOSIS — G4700 Insomnia, unspecified: Secondary | ICD-10-CM | POA: Diagnosis not present

## 2022-09-17 DIAGNOSIS — Z9181 History of falling: Secondary | ICD-10-CM | POA: Diagnosis not present

## 2022-09-17 DIAGNOSIS — I10 Essential (primary) hypertension: Secondary | ICD-10-CM | POA: Diagnosis not present

## 2022-09-17 DIAGNOSIS — E039 Hypothyroidism, unspecified: Secondary | ICD-10-CM | POA: Diagnosis not present

## 2022-09-17 DIAGNOSIS — K219 Gastro-esophageal reflux disease without esophagitis: Secondary | ICD-10-CM | POA: Diagnosis not present

## 2022-09-17 DIAGNOSIS — F32A Depression, unspecified: Secondary | ICD-10-CM | POA: Diagnosis not present

## 2022-09-17 DIAGNOSIS — Z87440 Personal history of urinary (tract) infections: Secondary | ICD-10-CM | POA: Diagnosis not present

## 2022-09-17 DIAGNOSIS — Z79891 Long term (current) use of opiate analgesic: Secondary | ICD-10-CM | POA: Diagnosis not present

## 2022-09-17 DIAGNOSIS — J45909 Unspecified asthma, uncomplicated: Secondary | ICD-10-CM | POA: Diagnosis not present

## 2022-09-17 DIAGNOSIS — B9689 Other specified bacterial agents as the cause of diseases classified elsewhere: Secondary | ICD-10-CM | POA: Diagnosis not present

## 2022-09-17 DIAGNOSIS — M4316 Spondylolisthesis, lumbar region: Secondary | ICD-10-CM | POA: Diagnosis not present

## 2022-09-17 DIAGNOSIS — M797 Fibromyalgia: Secondary | ICD-10-CM | POA: Diagnosis not present

## 2022-09-17 DIAGNOSIS — D649 Anemia, unspecified: Secondary | ICD-10-CM | POA: Diagnosis not present

## 2022-09-17 DIAGNOSIS — Z791 Long term (current) use of non-steroidal anti-inflammatories (NSAID): Secondary | ICD-10-CM | POA: Diagnosis not present

## 2022-09-17 DIAGNOSIS — Z87891 Personal history of nicotine dependence: Secondary | ICD-10-CM | POA: Diagnosis not present

## 2022-09-17 DIAGNOSIS — K589 Irritable bowel syndrome without diarrhea: Secondary | ICD-10-CM | POA: Diagnosis not present

## 2022-09-17 DIAGNOSIS — Z7982 Long term (current) use of aspirin: Secondary | ICD-10-CM | POA: Diagnosis not present

## 2022-09-17 DIAGNOSIS — T8142XD Infection following a procedure, deep incisional surgical site, subsequent encounter: Secondary | ICD-10-CM | POA: Diagnosis not present

## 2022-09-19 DIAGNOSIS — Z87440 Personal history of urinary (tract) infections: Secondary | ICD-10-CM | POA: Diagnosis not present

## 2022-09-19 DIAGNOSIS — E039 Hypothyroidism, unspecified: Secondary | ICD-10-CM | POA: Diagnosis not present

## 2022-09-19 DIAGNOSIS — Z791 Long term (current) use of non-steroidal anti-inflammatories (NSAID): Secondary | ICD-10-CM | POA: Diagnosis not present

## 2022-09-19 DIAGNOSIS — M797 Fibromyalgia: Secondary | ICD-10-CM | POA: Diagnosis not present

## 2022-09-19 DIAGNOSIS — K219 Gastro-esophageal reflux disease without esophagitis: Secondary | ICD-10-CM | POA: Diagnosis not present

## 2022-09-19 DIAGNOSIS — Z7982 Long term (current) use of aspirin: Secondary | ICD-10-CM | POA: Diagnosis not present

## 2022-09-19 DIAGNOSIS — I10 Essential (primary) hypertension: Secondary | ICD-10-CM | POA: Diagnosis not present

## 2022-09-19 DIAGNOSIS — Z87891 Personal history of nicotine dependence: Secondary | ICD-10-CM | POA: Diagnosis not present

## 2022-09-19 DIAGNOSIS — B9689 Other specified bacterial agents as the cause of diseases classified elsewhere: Secondary | ICD-10-CM | POA: Diagnosis not present

## 2022-09-19 DIAGNOSIS — J45909 Unspecified asthma, uncomplicated: Secondary | ICD-10-CM | POA: Diagnosis not present

## 2022-09-19 DIAGNOSIS — Z79891 Long term (current) use of opiate analgesic: Secondary | ICD-10-CM | POA: Diagnosis not present

## 2022-09-19 DIAGNOSIS — K589 Irritable bowel syndrome without diarrhea: Secondary | ICD-10-CM | POA: Diagnosis not present

## 2022-09-19 DIAGNOSIS — D649 Anemia, unspecified: Secondary | ICD-10-CM | POA: Diagnosis not present

## 2022-09-19 DIAGNOSIS — G4700 Insomnia, unspecified: Secondary | ICD-10-CM | POA: Diagnosis not present

## 2022-09-19 DIAGNOSIS — M4316 Spondylolisthesis, lumbar region: Secondary | ICD-10-CM | POA: Diagnosis not present

## 2022-09-19 DIAGNOSIS — F32A Depression, unspecified: Secondary | ICD-10-CM | POA: Diagnosis not present

## 2022-09-19 DIAGNOSIS — T8142XD Infection following a procedure, deep incisional surgical site, subsequent encounter: Secondary | ICD-10-CM | POA: Diagnosis not present

## 2022-09-19 DIAGNOSIS — Z9181 History of falling: Secondary | ICD-10-CM | POA: Diagnosis not present

## 2022-09-20 DIAGNOSIS — K589 Irritable bowel syndrome without diarrhea: Secondary | ICD-10-CM | POA: Diagnosis not present

## 2022-09-20 DIAGNOSIS — Z9181 History of falling: Secondary | ICD-10-CM | POA: Diagnosis not present

## 2022-09-20 DIAGNOSIS — F32A Depression, unspecified: Secondary | ICD-10-CM | POA: Diagnosis not present

## 2022-09-20 DIAGNOSIS — Z79891 Long term (current) use of opiate analgesic: Secondary | ICD-10-CM | POA: Diagnosis not present

## 2022-09-20 DIAGNOSIS — Z87440 Personal history of urinary (tract) infections: Secondary | ICD-10-CM | POA: Diagnosis not present

## 2022-09-20 DIAGNOSIS — M4316 Spondylolisthesis, lumbar region: Secondary | ICD-10-CM | POA: Diagnosis not present

## 2022-09-20 DIAGNOSIS — K219 Gastro-esophageal reflux disease without esophagitis: Secondary | ICD-10-CM | POA: Diagnosis not present

## 2022-09-20 DIAGNOSIS — D649 Anemia, unspecified: Secondary | ICD-10-CM | POA: Diagnosis not present

## 2022-09-20 DIAGNOSIS — J45909 Unspecified asthma, uncomplicated: Secondary | ICD-10-CM | POA: Diagnosis not present

## 2022-09-20 DIAGNOSIS — E039 Hypothyroidism, unspecified: Secondary | ICD-10-CM | POA: Diagnosis not present

## 2022-09-20 DIAGNOSIS — Z87891 Personal history of nicotine dependence: Secondary | ICD-10-CM | POA: Diagnosis not present

## 2022-09-20 DIAGNOSIS — M797 Fibromyalgia: Secondary | ICD-10-CM | POA: Diagnosis not present

## 2022-09-20 DIAGNOSIS — G4700 Insomnia, unspecified: Secondary | ICD-10-CM | POA: Diagnosis not present

## 2022-09-20 DIAGNOSIS — B9689 Other specified bacterial agents as the cause of diseases classified elsewhere: Secondary | ICD-10-CM | POA: Diagnosis not present

## 2022-09-20 DIAGNOSIS — I10 Essential (primary) hypertension: Secondary | ICD-10-CM | POA: Diagnosis not present

## 2022-09-20 DIAGNOSIS — Z791 Long term (current) use of non-steroidal anti-inflammatories (NSAID): Secondary | ICD-10-CM | POA: Diagnosis not present

## 2022-09-20 DIAGNOSIS — Z7982 Long term (current) use of aspirin: Secondary | ICD-10-CM | POA: Diagnosis not present

## 2022-09-20 DIAGNOSIS — T8142XD Infection following a procedure, deep incisional surgical site, subsequent encounter: Secondary | ICD-10-CM | POA: Diagnosis not present

## 2022-09-23 DIAGNOSIS — E039 Hypothyroidism, unspecified: Secondary | ICD-10-CM | POA: Diagnosis not present

## 2022-09-23 DIAGNOSIS — Z87891 Personal history of nicotine dependence: Secondary | ICD-10-CM | POA: Diagnosis not present

## 2022-09-23 DIAGNOSIS — K219 Gastro-esophageal reflux disease without esophagitis: Secondary | ICD-10-CM | POA: Diagnosis not present

## 2022-09-23 DIAGNOSIS — Z79891 Long term (current) use of opiate analgesic: Secondary | ICD-10-CM | POA: Diagnosis not present

## 2022-09-23 DIAGNOSIS — D649 Anemia, unspecified: Secondary | ICD-10-CM | POA: Diagnosis not present

## 2022-09-23 DIAGNOSIS — B9689 Other specified bacterial agents as the cause of diseases classified elsewhere: Secondary | ICD-10-CM | POA: Diagnosis not present

## 2022-09-23 DIAGNOSIS — M4316 Spondylolisthesis, lumbar region: Secondary | ICD-10-CM | POA: Diagnosis not present

## 2022-09-23 DIAGNOSIS — Z87440 Personal history of urinary (tract) infections: Secondary | ICD-10-CM | POA: Diagnosis not present

## 2022-09-23 DIAGNOSIS — K589 Irritable bowel syndrome without diarrhea: Secondary | ICD-10-CM | POA: Diagnosis not present

## 2022-09-23 DIAGNOSIS — Z791 Long term (current) use of non-steroidal anti-inflammatories (NSAID): Secondary | ICD-10-CM | POA: Diagnosis not present

## 2022-09-23 DIAGNOSIS — F32A Depression, unspecified: Secondary | ICD-10-CM | POA: Diagnosis not present

## 2022-09-23 DIAGNOSIS — Z9181 History of falling: Secondary | ICD-10-CM | POA: Diagnosis not present

## 2022-09-23 DIAGNOSIS — M797 Fibromyalgia: Secondary | ICD-10-CM | POA: Diagnosis not present

## 2022-09-23 DIAGNOSIS — J45909 Unspecified asthma, uncomplicated: Secondary | ICD-10-CM | POA: Diagnosis not present

## 2022-09-23 DIAGNOSIS — G4700 Insomnia, unspecified: Secondary | ICD-10-CM | POA: Diagnosis not present

## 2022-09-23 DIAGNOSIS — I10 Essential (primary) hypertension: Secondary | ICD-10-CM | POA: Diagnosis not present

## 2022-09-23 DIAGNOSIS — Z7982 Long term (current) use of aspirin: Secondary | ICD-10-CM | POA: Diagnosis not present

## 2022-09-23 DIAGNOSIS — T8142XD Infection following a procedure, deep incisional surgical site, subsequent encounter: Secondary | ICD-10-CM | POA: Diagnosis not present

## 2022-09-24 DIAGNOSIS — Z791 Long term (current) use of non-steroidal anti-inflammatories (NSAID): Secondary | ICD-10-CM | POA: Diagnosis not present

## 2022-09-24 DIAGNOSIS — Z87891 Personal history of nicotine dependence: Secondary | ICD-10-CM | POA: Diagnosis not present

## 2022-09-24 DIAGNOSIS — G4700 Insomnia, unspecified: Secondary | ICD-10-CM | POA: Diagnosis not present

## 2022-09-24 DIAGNOSIS — Z87440 Personal history of urinary (tract) infections: Secondary | ICD-10-CM | POA: Diagnosis not present

## 2022-09-24 DIAGNOSIS — T8142XD Infection following a procedure, deep incisional surgical site, subsequent encounter: Secondary | ICD-10-CM | POA: Diagnosis not present

## 2022-09-24 DIAGNOSIS — M4316 Spondylolisthesis, lumbar region: Secondary | ICD-10-CM | POA: Diagnosis not present

## 2022-09-24 DIAGNOSIS — K589 Irritable bowel syndrome without diarrhea: Secondary | ICD-10-CM | POA: Diagnosis not present

## 2022-09-24 DIAGNOSIS — I10 Essential (primary) hypertension: Secondary | ICD-10-CM | POA: Diagnosis not present

## 2022-09-24 DIAGNOSIS — B9689 Other specified bacterial agents as the cause of diseases classified elsewhere: Secondary | ICD-10-CM | POA: Diagnosis not present

## 2022-09-24 DIAGNOSIS — D649 Anemia, unspecified: Secondary | ICD-10-CM | POA: Diagnosis not present

## 2022-09-24 DIAGNOSIS — J45909 Unspecified asthma, uncomplicated: Secondary | ICD-10-CM | POA: Diagnosis not present

## 2022-09-24 DIAGNOSIS — F32A Depression, unspecified: Secondary | ICD-10-CM | POA: Diagnosis not present

## 2022-09-24 DIAGNOSIS — K219 Gastro-esophageal reflux disease without esophagitis: Secondary | ICD-10-CM | POA: Diagnosis not present

## 2022-09-24 DIAGNOSIS — M797 Fibromyalgia: Secondary | ICD-10-CM | POA: Diagnosis not present

## 2022-09-24 DIAGNOSIS — Z7982 Long term (current) use of aspirin: Secondary | ICD-10-CM | POA: Diagnosis not present

## 2022-09-24 DIAGNOSIS — Z79891 Long term (current) use of opiate analgesic: Secondary | ICD-10-CM | POA: Diagnosis not present

## 2022-09-24 DIAGNOSIS — Z9181 History of falling: Secondary | ICD-10-CM | POA: Diagnosis not present

## 2022-09-24 DIAGNOSIS — E039 Hypothyroidism, unspecified: Secondary | ICD-10-CM | POA: Diagnosis not present

## 2022-09-25 DIAGNOSIS — F32A Depression, unspecified: Secondary | ICD-10-CM | POA: Diagnosis not present

## 2022-09-25 DIAGNOSIS — Z87891 Personal history of nicotine dependence: Secondary | ICD-10-CM | POA: Diagnosis not present

## 2022-09-25 DIAGNOSIS — G4700 Insomnia, unspecified: Secondary | ICD-10-CM | POA: Diagnosis not present

## 2022-09-25 DIAGNOSIS — M4316 Spondylolisthesis, lumbar region: Secondary | ICD-10-CM | POA: Diagnosis not present

## 2022-09-25 DIAGNOSIS — B9689 Other specified bacterial agents as the cause of diseases classified elsewhere: Secondary | ICD-10-CM | POA: Diagnosis not present

## 2022-09-25 DIAGNOSIS — Z9181 History of falling: Secondary | ICD-10-CM | POA: Diagnosis not present

## 2022-09-25 DIAGNOSIS — Z79891 Long term (current) use of opiate analgesic: Secondary | ICD-10-CM | POA: Diagnosis not present

## 2022-09-25 DIAGNOSIS — Z791 Long term (current) use of non-steroidal anti-inflammatories (NSAID): Secondary | ICD-10-CM | POA: Diagnosis not present

## 2022-09-25 DIAGNOSIS — K589 Irritable bowel syndrome without diarrhea: Secondary | ICD-10-CM | POA: Diagnosis not present

## 2022-09-25 DIAGNOSIS — K219 Gastro-esophageal reflux disease without esophagitis: Secondary | ICD-10-CM | POA: Diagnosis not present

## 2022-09-25 DIAGNOSIS — M797 Fibromyalgia: Secondary | ICD-10-CM | POA: Diagnosis not present

## 2022-09-25 DIAGNOSIS — Z87440 Personal history of urinary (tract) infections: Secondary | ICD-10-CM | POA: Diagnosis not present

## 2022-09-25 DIAGNOSIS — E039 Hypothyroidism, unspecified: Secondary | ICD-10-CM | POA: Diagnosis not present

## 2022-09-25 DIAGNOSIS — J45909 Unspecified asthma, uncomplicated: Secondary | ICD-10-CM | POA: Diagnosis not present

## 2022-09-25 DIAGNOSIS — Z7982 Long term (current) use of aspirin: Secondary | ICD-10-CM | POA: Diagnosis not present

## 2022-09-25 DIAGNOSIS — D649 Anemia, unspecified: Secondary | ICD-10-CM | POA: Diagnosis not present

## 2022-09-25 DIAGNOSIS — T8142XD Infection following a procedure, deep incisional surgical site, subsequent encounter: Secondary | ICD-10-CM | POA: Diagnosis not present

## 2022-09-25 DIAGNOSIS — I10 Essential (primary) hypertension: Secondary | ICD-10-CM | POA: Diagnosis not present

## 2022-09-27 DIAGNOSIS — T8142XD Infection following a procedure, deep incisional surgical site, subsequent encounter: Secondary | ICD-10-CM | POA: Diagnosis not present

## 2022-09-27 DIAGNOSIS — Z79891 Long term (current) use of opiate analgesic: Secondary | ICD-10-CM | POA: Diagnosis not present

## 2022-09-27 DIAGNOSIS — I10 Essential (primary) hypertension: Secondary | ICD-10-CM | POA: Diagnosis not present

## 2022-09-27 DIAGNOSIS — K219 Gastro-esophageal reflux disease without esophagitis: Secondary | ICD-10-CM | POA: Diagnosis not present

## 2022-09-27 DIAGNOSIS — M4316 Spondylolisthesis, lumbar region: Secondary | ICD-10-CM | POA: Diagnosis not present

## 2022-09-27 DIAGNOSIS — E039 Hypothyroidism, unspecified: Secondary | ICD-10-CM | POA: Diagnosis not present

## 2022-09-27 DIAGNOSIS — Z791 Long term (current) use of non-steroidal anti-inflammatories (NSAID): Secondary | ICD-10-CM | POA: Diagnosis not present

## 2022-09-27 DIAGNOSIS — Z7982 Long term (current) use of aspirin: Secondary | ICD-10-CM | POA: Diagnosis not present

## 2022-09-27 DIAGNOSIS — M797 Fibromyalgia: Secondary | ICD-10-CM | POA: Diagnosis not present

## 2022-09-27 DIAGNOSIS — K589 Irritable bowel syndrome without diarrhea: Secondary | ICD-10-CM | POA: Diagnosis not present

## 2022-09-27 DIAGNOSIS — B9689 Other specified bacterial agents as the cause of diseases classified elsewhere: Secondary | ICD-10-CM | POA: Diagnosis not present

## 2022-09-27 DIAGNOSIS — J45909 Unspecified asthma, uncomplicated: Secondary | ICD-10-CM | POA: Diagnosis not present

## 2022-09-27 DIAGNOSIS — G4700 Insomnia, unspecified: Secondary | ICD-10-CM | POA: Diagnosis not present

## 2022-09-27 DIAGNOSIS — F32A Depression, unspecified: Secondary | ICD-10-CM | POA: Diagnosis not present

## 2022-09-27 DIAGNOSIS — Z87440 Personal history of urinary (tract) infections: Secondary | ICD-10-CM | POA: Diagnosis not present

## 2022-09-27 DIAGNOSIS — D649 Anemia, unspecified: Secondary | ICD-10-CM | POA: Diagnosis not present

## 2022-09-27 DIAGNOSIS — Z87891 Personal history of nicotine dependence: Secondary | ICD-10-CM | POA: Diagnosis not present

## 2022-09-27 DIAGNOSIS — Z9181 History of falling: Secondary | ICD-10-CM | POA: Diagnosis not present

## 2022-10-01 DIAGNOSIS — G4700 Insomnia, unspecified: Secondary | ICD-10-CM | POA: Diagnosis not present

## 2022-10-01 DIAGNOSIS — Z791 Long term (current) use of non-steroidal anti-inflammatories (NSAID): Secondary | ICD-10-CM | POA: Diagnosis not present

## 2022-10-01 DIAGNOSIS — K219 Gastro-esophageal reflux disease without esophagitis: Secondary | ICD-10-CM | POA: Diagnosis not present

## 2022-10-01 DIAGNOSIS — I10 Essential (primary) hypertension: Secondary | ICD-10-CM | POA: Diagnosis not present

## 2022-10-01 DIAGNOSIS — Z7982 Long term (current) use of aspirin: Secondary | ICD-10-CM | POA: Diagnosis not present

## 2022-10-01 DIAGNOSIS — Z87440 Personal history of urinary (tract) infections: Secondary | ICD-10-CM | POA: Diagnosis not present

## 2022-10-01 DIAGNOSIS — F32A Depression, unspecified: Secondary | ICD-10-CM | POA: Diagnosis not present

## 2022-10-01 DIAGNOSIS — B9689 Other specified bacterial agents as the cause of diseases classified elsewhere: Secondary | ICD-10-CM | POA: Diagnosis not present

## 2022-10-01 DIAGNOSIS — K589 Irritable bowel syndrome without diarrhea: Secondary | ICD-10-CM | POA: Diagnosis not present

## 2022-10-01 DIAGNOSIS — T8142XD Infection following a procedure, deep incisional surgical site, subsequent encounter: Secondary | ICD-10-CM | POA: Diagnosis not present

## 2022-10-01 DIAGNOSIS — M4316 Spondylolisthesis, lumbar region: Secondary | ICD-10-CM | POA: Diagnosis not present

## 2022-10-01 DIAGNOSIS — E039 Hypothyroidism, unspecified: Secondary | ICD-10-CM | POA: Diagnosis not present

## 2022-10-01 DIAGNOSIS — Z9181 History of falling: Secondary | ICD-10-CM | POA: Diagnosis not present

## 2022-10-01 DIAGNOSIS — Z87891 Personal history of nicotine dependence: Secondary | ICD-10-CM | POA: Diagnosis not present

## 2022-10-01 DIAGNOSIS — J45909 Unspecified asthma, uncomplicated: Secondary | ICD-10-CM | POA: Diagnosis not present

## 2022-10-01 DIAGNOSIS — Z79891 Long term (current) use of opiate analgesic: Secondary | ICD-10-CM | POA: Diagnosis not present

## 2022-10-01 DIAGNOSIS — D649 Anemia, unspecified: Secondary | ICD-10-CM | POA: Diagnosis not present

## 2022-10-01 DIAGNOSIS — M797 Fibromyalgia: Secondary | ICD-10-CM | POA: Diagnosis not present

## 2022-10-02 DIAGNOSIS — F32A Depression, unspecified: Secondary | ICD-10-CM | POA: Diagnosis not present

## 2022-10-02 DIAGNOSIS — Z9181 History of falling: Secondary | ICD-10-CM | POA: Diagnosis not present

## 2022-10-02 DIAGNOSIS — I10 Essential (primary) hypertension: Secondary | ICD-10-CM | POA: Diagnosis not present

## 2022-10-02 DIAGNOSIS — K589 Irritable bowel syndrome without diarrhea: Secondary | ICD-10-CM | POA: Diagnosis not present

## 2022-10-02 DIAGNOSIS — M797 Fibromyalgia: Secondary | ICD-10-CM | POA: Diagnosis not present

## 2022-10-02 DIAGNOSIS — D649 Anemia, unspecified: Secondary | ICD-10-CM | POA: Diagnosis not present

## 2022-10-02 DIAGNOSIS — Z7982 Long term (current) use of aspirin: Secondary | ICD-10-CM | POA: Diagnosis not present

## 2022-10-02 DIAGNOSIS — Z791 Long term (current) use of non-steroidal anti-inflammatories (NSAID): Secondary | ICD-10-CM | POA: Diagnosis not present

## 2022-10-02 DIAGNOSIS — B9689 Other specified bacterial agents as the cause of diseases classified elsewhere: Secondary | ICD-10-CM | POA: Diagnosis not present

## 2022-10-02 DIAGNOSIS — Z79891 Long term (current) use of opiate analgesic: Secondary | ICD-10-CM | POA: Diagnosis not present

## 2022-10-02 DIAGNOSIS — J45909 Unspecified asthma, uncomplicated: Secondary | ICD-10-CM | POA: Diagnosis not present

## 2022-10-02 DIAGNOSIS — K219 Gastro-esophageal reflux disease without esophagitis: Secondary | ICD-10-CM | POA: Diagnosis not present

## 2022-10-02 DIAGNOSIS — G4700 Insomnia, unspecified: Secondary | ICD-10-CM | POA: Diagnosis not present

## 2022-10-02 DIAGNOSIS — E039 Hypothyroidism, unspecified: Secondary | ICD-10-CM | POA: Diagnosis not present

## 2022-10-02 DIAGNOSIS — Z87891 Personal history of nicotine dependence: Secondary | ICD-10-CM | POA: Diagnosis not present

## 2022-10-02 DIAGNOSIS — Z87440 Personal history of urinary (tract) infections: Secondary | ICD-10-CM | POA: Diagnosis not present

## 2022-10-02 DIAGNOSIS — M4316 Spondylolisthesis, lumbar region: Secondary | ICD-10-CM | POA: Diagnosis not present

## 2022-10-02 DIAGNOSIS — T8142XD Infection following a procedure, deep incisional surgical site, subsequent encounter: Secondary | ICD-10-CM | POA: Diagnosis not present

## 2022-10-04 DIAGNOSIS — Z791 Long term (current) use of non-steroidal anti-inflammatories (NSAID): Secondary | ICD-10-CM | POA: Diagnosis not present

## 2022-10-04 DIAGNOSIS — Z87891 Personal history of nicotine dependence: Secondary | ICD-10-CM | POA: Diagnosis not present

## 2022-10-04 DIAGNOSIS — E039 Hypothyroidism, unspecified: Secondary | ICD-10-CM | POA: Diagnosis not present

## 2022-10-04 DIAGNOSIS — M4316 Spondylolisthesis, lumbar region: Secondary | ICD-10-CM | POA: Diagnosis not present

## 2022-10-04 DIAGNOSIS — B9689 Other specified bacterial agents as the cause of diseases classified elsewhere: Secondary | ICD-10-CM | POA: Diagnosis not present

## 2022-10-04 DIAGNOSIS — K219 Gastro-esophageal reflux disease without esophagitis: Secondary | ICD-10-CM | POA: Diagnosis not present

## 2022-10-04 DIAGNOSIS — Z7982 Long term (current) use of aspirin: Secondary | ICD-10-CM | POA: Diagnosis not present

## 2022-10-04 DIAGNOSIS — I10 Essential (primary) hypertension: Secondary | ICD-10-CM | POA: Diagnosis not present

## 2022-10-04 DIAGNOSIS — Z79891 Long term (current) use of opiate analgesic: Secondary | ICD-10-CM | POA: Diagnosis not present

## 2022-10-04 DIAGNOSIS — F32A Depression, unspecified: Secondary | ICD-10-CM | POA: Diagnosis not present

## 2022-10-04 DIAGNOSIS — Z9181 History of falling: Secondary | ICD-10-CM | POA: Diagnosis not present

## 2022-10-04 DIAGNOSIS — G4700 Insomnia, unspecified: Secondary | ICD-10-CM | POA: Diagnosis not present

## 2022-10-04 DIAGNOSIS — T8142XD Infection following a procedure, deep incisional surgical site, subsequent encounter: Secondary | ICD-10-CM | POA: Diagnosis not present

## 2022-10-04 DIAGNOSIS — K589 Irritable bowel syndrome without diarrhea: Secondary | ICD-10-CM | POA: Diagnosis not present

## 2022-10-04 DIAGNOSIS — M797 Fibromyalgia: Secondary | ICD-10-CM | POA: Diagnosis not present

## 2022-10-04 DIAGNOSIS — D649 Anemia, unspecified: Secondary | ICD-10-CM | POA: Diagnosis not present

## 2022-10-04 DIAGNOSIS — Z87440 Personal history of urinary (tract) infections: Secondary | ICD-10-CM | POA: Diagnosis not present

## 2022-10-04 DIAGNOSIS — J45909 Unspecified asthma, uncomplicated: Secondary | ICD-10-CM | POA: Diagnosis not present

## 2022-10-06 DIAGNOSIS — M797 Fibromyalgia: Secondary | ICD-10-CM | POA: Diagnosis not present

## 2022-10-06 DIAGNOSIS — G4700 Insomnia, unspecified: Secondary | ICD-10-CM | POA: Diagnosis not present

## 2022-10-06 DIAGNOSIS — E559 Vitamin D deficiency, unspecified: Secondary | ICD-10-CM | POA: Diagnosis not present

## 2022-10-06 DIAGNOSIS — F32A Depression, unspecified: Secondary | ICD-10-CM | POA: Diagnosis not present

## 2022-10-06 DIAGNOSIS — M25561 Pain in right knee: Secondary | ICD-10-CM | POA: Diagnosis not present

## 2022-10-06 DIAGNOSIS — Z79899 Other long term (current) drug therapy: Secondary | ICD-10-CM | POA: Diagnosis not present

## 2022-10-06 DIAGNOSIS — M545 Low back pain, unspecified: Secondary | ICD-10-CM | POA: Diagnosis not present

## 2022-10-06 DIAGNOSIS — G8929 Other chronic pain: Secondary | ICD-10-CM | POA: Diagnosis not present

## 2022-10-06 DIAGNOSIS — M25562 Pain in left knee: Secondary | ICD-10-CM | POA: Diagnosis not present

## 2022-10-06 DIAGNOSIS — Z9181 History of falling: Secondary | ICD-10-CM | POA: Diagnosis not present

## 2022-10-06 DIAGNOSIS — M62838 Other muscle spasm: Secondary | ICD-10-CM | POA: Diagnosis not present

## 2022-10-08 DIAGNOSIS — Z79891 Long term (current) use of opiate analgesic: Secondary | ICD-10-CM | POA: Diagnosis not present

## 2022-10-08 DIAGNOSIS — T8142XD Infection following a procedure, deep incisional surgical site, subsequent encounter: Secondary | ICD-10-CM | POA: Diagnosis not present

## 2022-10-08 DIAGNOSIS — J45909 Unspecified asthma, uncomplicated: Secondary | ICD-10-CM | POA: Diagnosis not present

## 2022-10-08 DIAGNOSIS — F32A Depression, unspecified: Secondary | ICD-10-CM | POA: Diagnosis not present

## 2022-10-08 DIAGNOSIS — B9689 Other specified bacterial agents as the cause of diseases classified elsewhere: Secondary | ICD-10-CM | POA: Diagnosis not present

## 2022-10-08 DIAGNOSIS — M797 Fibromyalgia: Secondary | ICD-10-CM | POA: Diagnosis not present

## 2022-10-08 DIAGNOSIS — Z791 Long term (current) use of non-steroidal anti-inflammatories (NSAID): Secondary | ICD-10-CM | POA: Diagnosis not present

## 2022-10-08 DIAGNOSIS — Z7982 Long term (current) use of aspirin: Secondary | ICD-10-CM | POA: Diagnosis not present

## 2022-10-08 DIAGNOSIS — K219 Gastro-esophageal reflux disease without esophagitis: Secondary | ICD-10-CM | POA: Diagnosis not present

## 2022-10-08 DIAGNOSIS — G4700 Insomnia, unspecified: Secondary | ICD-10-CM | POA: Diagnosis not present

## 2022-10-08 DIAGNOSIS — Z9181 History of falling: Secondary | ICD-10-CM | POA: Diagnosis not present

## 2022-10-08 DIAGNOSIS — Z87440 Personal history of urinary (tract) infections: Secondary | ICD-10-CM | POA: Diagnosis not present

## 2022-10-08 DIAGNOSIS — K589 Irritable bowel syndrome without diarrhea: Secondary | ICD-10-CM | POA: Diagnosis not present

## 2022-10-08 DIAGNOSIS — M4316 Spondylolisthesis, lumbar region: Secondary | ICD-10-CM | POA: Diagnosis not present

## 2022-10-08 DIAGNOSIS — E039 Hypothyroidism, unspecified: Secondary | ICD-10-CM | POA: Diagnosis not present

## 2022-10-08 DIAGNOSIS — Z87891 Personal history of nicotine dependence: Secondary | ICD-10-CM | POA: Diagnosis not present

## 2022-10-08 DIAGNOSIS — D649 Anemia, unspecified: Secondary | ICD-10-CM | POA: Diagnosis not present

## 2022-10-08 DIAGNOSIS — I10 Essential (primary) hypertension: Secondary | ICD-10-CM | POA: Diagnosis not present

## 2022-10-09 DIAGNOSIS — K219 Gastro-esophageal reflux disease without esophagitis: Secondary | ICD-10-CM | POA: Diagnosis not present

## 2022-10-09 DIAGNOSIS — D649 Anemia, unspecified: Secondary | ICD-10-CM | POA: Diagnosis not present

## 2022-10-09 DIAGNOSIS — M797 Fibromyalgia: Secondary | ICD-10-CM | POA: Diagnosis not present

## 2022-10-09 DIAGNOSIS — Z791 Long term (current) use of non-steroidal anti-inflammatories (NSAID): Secondary | ICD-10-CM | POA: Diagnosis not present

## 2022-10-09 DIAGNOSIS — E039 Hypothyroidism, unspecified: Secondary | ICD-10-CM | POA: Diagnosis not present

## 2022-10-09 DIAGNOSIS — Z87891 Personal history of nicotine dependence: Secondary | ICD-10-CM | POA: Diagnosis not present

## 2022-10-09 DIAGNOSIS — M4316 Spondylolisthesis, lumbar region: Secondary | ICD-10-CM | POA: Diagnosis not present

## 2022-10-09 DIAGNOSIS — J45909 Unspecified asthma, uncomplicated: Secondary | ICD-10-CM | POA: Diagnosis not present

## 2022-10-09 DIAGNOSIS — K589 Irritable bowel syndrome without diarrhea: Secondary | ICD-10-CM | POA: Diagnosis not present

## 2022-10-09 DIAGNOSIS — Z87440 Personal history of urinary (tract) infections: Secondary | ICD-10-CM | POA: Diagnosis not present

## 2022-10-09 DIAGNOSIS — F32A Depression, unspecified: Secondary | ICD-10-CM | POA: Diagnosis not present

## 2022-10-09 DIAGNOSIS — Z7982 Long term (current) use of aspirin: Secondary | ICD-10-CM | POA: Diagnosis not present

## 2022-10-09 DIAGNOSIS — B9689 Other specified bacterial agents as the cause of diseases classified elsewhere: Secondary | ICD-10-CM | POA: Diagnosis not present

## 2022-10-09 DIAGNOSIS — G4700 Insomnia, unspecified: Secondary | ICD-10-CM | POA: Diagnosis not present

## 2022-10-09 DIAGNOSIS — T8142XD Infection following a procedure, deep incisional surgical site, subsequent encounter: Secondary | ICD-10-CM | POA: Diagnosis not present

## 2022-10-09 DIAGNOSIS — Z79891 Long term (current) use of opiate analgesic: Secondary | ICD-10-CM | POA: Diagnosis not present

## 2022-10-09 DIAGNOSIS — Z9181 History of falling: Secondary | ICD-10-CM | POA: Diagnosis not present

## 2022-10-09 DIAGNOSIS — I10 Essential (primary) hypertension: Secondary | ICD-10-CM | POA: Diagnosis not present

## 2022-10-09 DIAGNOSIS — Z79899 Other long term (current) drug therapy: Secondary | ICD-10-CM | POA: Diagnosis not present

## 2022-10-10 DIAGNOSIS — Z9181 History of falling: Secondary | ICD-10-CM | POA: Diagnosis not present

## 2022-10-10 DIAGNOSIS — F32A Depression, unspecified: Secondary | ICD-10-CM | POA: Diagnosis not present

## 2022-10-10 DIAGNOSIS — M797 Fibromyalgia: Secondary | ICD-10-CM | POA: Diagnosis not present

## 2022-10-10 DIAGNOSIS — Z87891 Personal history of nicotine dependence: Secondary | ICD-10-CM | POA: Diagnosis not present

## 2022-10-10 DIAGNOSIS — Z79891 Long term (current) use of opiate analgesic: Secondary | ICD-10-CM | POA: Diagnosis not present

## 2022-10-10 DIAGNOSIS — Z7982 Long term (current) use of aspirin: Secondary | ICD-10-CM | POA: Diagnosis not present

## 2022-10-10 DIAGNOSIS — Z791 Long term (current) use of non-steroidal anti-inflammatories (NSAID): Secondary | ICD-10-CM | POA: Diagnosis not present

## 2022-10-10 DIAGNOSIS — J45909 Unspecified asthma, uncomplicated: Secondary | ICD-10-CM | POA: Diagnosis not present

## 2022-10-10 DIAGNOSIS — Z87440 Personal history of urinary (tract) infections: Secondary | ICD-10-CM | POA: Diagnosis not present

## 2022-10-10 DIAGNOSIS — M4316 Spondylolisthesis, lumbar region: Secondary | ICD-10-CM | POA: Diagnosis not present

## 2022-10-10 DIAGNOSIS — B9689 Other specified bacterial agents as the cause of diseases classified elsewhere: Secondary | ICD-10-CM | POA: Diagnosis not present

## 2022-10-10 DIAGNOSIS — I10 Essential (primary) hypertension: Secondary | ICD-10-CM | POA: Diagnosis not present

## 2022-10-10 DIAGNOSIS — E039 Hypothyroidism, unspecified: Secondary | ICD-10-CM | POA: Diagnosis not present

## 2022-10-10 DIAGNOSIS — D649 Anemia, unspecified: Secondary | ICD-10-CM | POA: Diagnosis not present

## 2022-10-10 DIAGNOSIS — T8142XD Infection following a procedure, deep incisional surgical site, subsequent encounter: Secondary | ICD-10-CM | POA: Diagnosis not present

## 2022-10-10 DIAGNOSIS — G4700 Insomnia, unspecified: Secondary | ICD-10-CM | POA: Diagnosis not present

## 2022-10-10 DIAGNOSIS — K219 Gastro-esophageal reflux disease without esophagitis: Secondary | ICD-10-CM | POA: Diagnosis not present

## 2022-10-10 DIAGNOSIS — K589 Irritable bowel syndrome without diarrhea: Secondary | ICD-10-CM | POA: Diagnosis not present

## 2022-10-11 DIAGNOSIS — M797 Fibromyalgia: Secondary | ICD-10-CM | POA: Diagnosis not present

## 2022-10-11 DIAGNOSIS — Z9181 History of falling: Secondary | ICD-10-CM | POA: Diagnosis not present

## 2022-10-11 DIAGNOSIS — D649 Anemia, unspecified: Secondary | ICD-10-CM | POA: Diagnosis not present

## 2022-10-11 DIAGNOSIS — K589 Irritable bowel syndrome without diarrhea: Secondary | ICD-10-CM | POA: Diagnosis not present

## 2022-10-11 DIAGNOSIS — I10 Essential (primary) hypertension: Secondary | ICD-10-CM | POA: Diagnosis not present

## 2022-10-11 DIAGNOSIS — T8142XD Infection following a procedure, deep incisional surgical site, subsequent encounter: Secondary | ICD-10-CM | POA: Diagnosis not present

## 2022-10-11 DIAGNOSIS — Z87891 Personal history of nicotine dependence: Secondary | ICD-10-CM | POA: Diagnosis not present

## 2022-10-11 DIAGNOSIS — M4316 Spondylolisthesis, lumbar region: Secondary | ICD-10-CM | POA: Diagnosis not present

## 2022-10-11 DIAGNOSIS — Z7982 Long term (current) use of aspirin: Secondary | ICD-10-CM | POA: Diagnosis not present

## 2022-10-11 DIAGNOSIS — B9689 Other specified bacterial agents as the cause of diseases classified elsewhere: Secondary | ICD-10-CM | POA: Diagnosis not present

## 2022-10-11 DIAGNOSIS — Z79891 Long term (current) use of opiate analgesic: Secondary | ICD-10-CM | POA: Diagnosis not present

## 2022-10-11 DIAGNOSIS — Z791 Long term (current) use of non-steroidal anti-inflammatories (NSAID): Secondary | ICD-10-CM | POA: Diagnosis not present

## 2022-10-11 DIAGNOSIS — J45909 Unspecified asthma, uncomplicated: Secondary | ICD-10-CM | POA: Diagnosis not present

## 2022-10-11 DIAGNOSIS — Z87440 Personal history of urinary (tract) infections: Secondary | ICD-10-CM | POA: Diagnosis not present

## 2022-10-11 DIAGNOSIS — G4700 Insomnia, unspecified: Secondary | ICD-10-CM | POA: Diagnosis not present

## 2022-10-11 DIAGNOSIS — E039 Hypothyroidism, unspecified: Secondary | ICD-10-CM | POA: Diagnosis not present

## 2022-10-11 DIAGNOSIS — F32A Depression, unspecified: Secondary | ICD-10-CM | POA: Diagnosis not present

## 2022-10-11 DIAGNOSIS — K219 Gastro-esophageal reflux disease without esophagitis: Secondary | ICD-10-CM | POA: Diagnosis not present

## 2022-10-14 DIAGNOSIS — Z791 Long term (current) use of non-steroidal anti-inflammatories (NSAID): Secondary | ICD-10-CM | POA: Diagnosis not present

## 2022-10-14 DIAGNOSIS — Z9181 History of falling: Secondary | ICD-10-CM | POA: Diagnosis not present

## 2022-10-14 DIAGNOSIS — Z87891 Personal history of nicotine dependence: Secondary | ICD-10-CM | POA: Diagnosis not present

## 2022-10-14 DIAGNOSIS — T8142XD Infection following a procedure, deep incisional surgical site, subsequent encounter: Secondary | ICD-10-CM | POA: Diagnosis not present

## 2022-10-14 DIAGNOSIS — F32A Depression, unspecified: Secondary | ICD-10-CM | POA: Diagnosis not present

## 2022-10-14 DIAGNOSIS — E039 Hypothyroidism, unspecified: Secondary | ICD-10-CM | POA: Diagnosis not present

## 2022-10-14 DIAGNOSIS — Z7982 Long term (current) use of aspirin: Secondary | ICD-10-CM | POA: Diagnosis not present

## 2022-10-14 DIAGNOSIS — Z79891 Long term (current) use of opiate analgesic: Secondary | ICD-10-CM | POA: Diagnosis not present

## 2022-10-14 DIAGNOSIS — M4316 Spondylolisthesis, lumbar region: Secondary | ICD-10-CM | POA: Diagnosis not present

## 2022-10-14 DIAGNOSIS — D649 Anemia, unspecified: Secondary | ICD-10-CM | POA: Diagnosis not present

## 2022-10-14 DIAGNOSIS — M797 Fibromyalgia: Secondary | ICD-10-CM | POA: Diagnosis not present

## 2022-10-14 DIAGNOSIS — B9689 Other specified bacterial agents as the cause of diseases classified elsewhere: Secondary | ICD-10-CM | POA: Diagnosis not present

## 2022-10-14 DIAGNOSIS — K589 Irritable bowel syndrome without diarrhea: Secondary | ICD-10-CM | POA: Diagnosis not present

## 2022-10-14 DIAGNOSIS — Z87440 Personal history of urinary (tract) infections: Secondary | ICD-10-CM | POA: Diagnosis not present

## 2022-10-14 DIAGNOSIS — I10 Essential (primary) hypertension: Secondary | ICD-10-CM | POA: Diagnosis not present

## 2022-10-14 DIAGNOSIS — G4700 Insomnia, unspecified: Secondary | ICD-10-CM | POA: Diagnosis not present

## 2022-10-14 DIAGNOSIS — K219 Gastro-esophageal reflux disease without esophagitis: Secondary | ICD-10-CM | POA: Diagnosis not present

## 2022-10-14 DIAGNOSIS — J45909 Unspecified asthma, uncomplicated: Secondary | ICD-10-CM | POA: Diagnosis not present

## 2022-10-16 DIAGNOSIS — M4316 Spondylolisthesis, lumbar region: Secondary | ICD-10-CM | POA: Diagnosis not present

## 2022-10-16 DIAGNOSIS — T8142XD Infection following a procedure, deep incisional surgical site, subsequent encounter: Secondary | ICD-10-CM | POA: Diagnosis not present

## 2022-10-16 DIAGNOSIS — J45909 Unspecified asthma, uncomplicated: Secondary | ICD-10-CM | POA: Diagnosis not present

## 2022-10-16 DIAGNOSIS — K219 Gastro-esophageal reflux disease without esophagitis: Secondary | ICD-10-CM | POA: Diagnosis not present

## 2022-10-16 DIAGNOSIS — E039 Hypothyroidism, unspecified: Secondary | ICD-10-CM | POA: Diagnosis not present

## 2022-10-16 DIAGNOSIS — Z87891 Personal history of nicotine dependence: Secondary | ICD-10-CM | POA: Diagnosis not present

## 2022-10-16 DIAGNOSIS — D649 Anemia, unspecified: Secondary | ICD-10-CM | POA: Diagnosis not present

## 2022-10-16 DIAGNOSIS — F32A Depression, unspecified: Secondary | ICD-10-CM | POA: Diagnosis not present

## 2022-10-16 DIAGNOSIS — Z7982 Long term (current) use of aspirin: Secondary | ICD-10-CM | POA: Diagnosis not present

## 2022-10-16 DIAGNOSIS — G4700 Insomnia, unspecified: Secondary | ICD-10-CM | POA: Diagnosis not present

## 2022-10-16 DIAGNOSIS — K589 Irritable bowel syndrome without diarrhea: Secondary | ICD-10-CM | POA: Diagnosis not present

## 2022-10-16 DIAGNOSIS — Z791 Long term (current) use of non-steroidal anti-inflammatories (NSAID): Secondary | ICD-10-CM | POA: Diagnosis not present

## 2022-10-16 DIAGNOSIS — B9689 Other specified bacterial agents as the cause of diseases classified elsewhere: Secondary | ICD-10-CM | POA: Diagnosis not present

## 2022-10-16 DIAGNOSIS — Z87440 Personal history of urinary (tract) infections: Secondary | ICD-10-CM | POA: Diagnosis not present

## 2022-10-16 DIAGNOSIS — M797 Fibromyalgia: Secondary | ICD-10-CM | POA: Diagnosis not present

## 2022-10-16 DIAGNOSIS — Z9181 History of falling: Secondary | ICD-10-CM | POA: Diagnosis not present

## 2022-10-16 DIAGNOSIS — I10 Essential (primary) hypertension: Secondary | ICD-10-CM | POA: Diagnosis not present

## 2022-10-16 DIAGNOSIS — Z79891 Long term (current) use of opiate analgesic: Secondary | ICD-10-CM | POA: Diagnosis not present

## 2022-10-17 DIAGNOSIS — K219 Gastro-esophageal reflux disease without esophagitis: Secondary | ICD-10-CM | POA: Diagnosis not present

## 2022-10-17 DIAGNOSIS — M4316 Spondylolisthesis, lumbar region: Secondary | ICD-10-CM | POA: Diagnosis not present

## 2022-10-17 DIAGNOSIS — J45909 Unspecified asthma, uncomplicated: Secondary | ICD-10-CM | POA: Diagnosis not present

## 2022-10-17 DIAGNOSIS — G4700 Insomnia, unspecified: Secondary | ICD-10-CM | POA: Diagnosis not present

## 2022-10-17 DIAGNOSIS — D649 Anemia, unspecified: Secondary | ICD-10-CM | POA: Diagnosis not present

## 2022-10-17 DIAGNOSIS — K589 Irritable bowel syndrome without diarrhea: Secondary | ICD-10-CM | POA: Diagnosis not present

## 2022-10-17 DIAGNOSIS — M797 Fibromyalgia: Secondary | ICD-10-CM | POA: Diagnosis not present

## 2022-10-17 DIAGNOSIS — T8142XD Infection following a procedure, deep incisional surgical site, subsequent encounter: Secondary | ICD-10-CM | POA: Diagnosis not present

## 2022-10-17 DIAGNOSIS — Z7982 Long term (current) use of aspirin: Secondary | ICD-10-CM | POA: Diagnosis not present

## 2022-10-17 DIAGNOSIS — Z79891 Long term (current) use of opiate analgesic: Secondary | ICD-10-CM | POA: Diagnosis not present

## 2022-10-17 DIAGNOSIS — Z87440 Personal history of urinary (tract) infections: Secondary | ICD-10-CM | POA: Diagnosis not present

## 2022-10-17 DIAGNOSIS — Z791 Long term (current) use of non-steroidal anti-inflammatories (NSAID): Secondary | ICD-10-CM | POA: Diagnosis not present

## 2022-10-17 DIAGNOSIS — E039 Hypothyroidism, unspecified: Secondary | ICD-10-CM | POA: Diagnosis not present

## 2022-10-17 DIAGNOSIS — F32A Depression, unspecified: Secondary | ICD-10-CM | POA: Diagnosis not present

## 2022-10-17 DIAGNOSIS — Z9181 History of falling: Secondary | ICD-10-CM | POA: Diagnosis not present

## 2022-10-17 DIAGNOSIS — I10 Essential (primary) hypertension: Secondary | ICD-10-CM | POA: Diagnosis not present

## 2022-10-17 DIAGNOSIS — Z87891 Personal history of nicotine dependence: Secondary | ICD-10-CM | POA: Diagnosis not present

## 2022-10-17 DIAGNOSIS — B9689 Other specified bacterial agents as the cause of diseases classified elsewhere: Secondary | ICD-10-CM | POA: Diagnosis not present

## 2022-10-18 DIAGNOSIS — I10 Essential (primary) hypertension: Secondary | ICD-10-CM | POA: Diagnosis not present

## 2022-10-18 DIAGNOSIS — G4700 Insomnia, unspecified: Secondary | ICD-10-CM | POA: Diagnosis not present

## 2022-10-18 DIAGNOSIS — Z9181 History of falling: Secondary | ICD-10-CM | POA: Diagnosis not present

## 2022-10-18 DIAGNOSIS — Z87440 Personal history of urinary (tract) infections: Secondary | ICD-10-CM | POA: Diagnosis not present

## 2022-10-18 DIAGNOSIS — T8142XD Infection following a procedure, deep incisional surgical site, subsequent encounter: Secondary | ICD-10-CM | POA: Diagnosis not present

## 2022-10-18 DIAGNOSIS — Z79891 Long term (current) use of opiate analgesic: Secondary | ICD-10-CM | POA: Diagnosis not present

## 2022-10-18 DIAGNOSIS — Z7982 Long term (current) use of aspirin: Secondary | ICD-10-CM | POA: Diagnosis not present

## 2022-10-18 DIAGNOSIS — K219 Gastro-esophageal reflux disease without esophagitis: Secondary | ICD-10-CM | POA: Diagnosis not present

## 2022-10-18 DIAGNOSIS — Z87891 Personal history of nicotine dependence: Secondary | ICD-10-CM | POA: Diagnosis not present

## 2022-10-18 DIAGNOSIS — F32A Depression, unspecified: Secondary | ICD-10-CM | POA: Diagnosis not present

## 2022-10-18 DIAGNOSIS — K589 Irritable bowel syndrome without diarrhea: Secondary | ICD-10-CM | POA: Diagnosis not present

## 2022-10-18 DIAGNOSIS — D649 Anemia, unspecified: Secondary | ICD-10-CM | POA: Diagnosis not present

## 2022-10-18 DIAGNOSIS — E039 Hypothyroidism, unspecified: Secondary | ICD-10-CM | POA: Diagnosis not present

## 2022-10-18 DIAGNOSIS — M797 Fibromyalgia: Secondary | ICD-10-CM | POA: Diagnosis not present

## 2022-10-18 DIAGNOSIS — M4316 Spondylolisthesis, lumbar region: Secondary | ICD-10-CM | POA: Diagnosis not present

## 2022-10-18 DIAGNOSIS — J45909 Unspecified asthma, uncomplicated: Secondary | ICD-10-CM | POA: Diagnosis not present

## 2022-10-18 DIAGNOSIS — B9689 Other specified bacterial agents as the cause of diseases classified elsewhere: Secondary | ICD-10-CM | POA: Diagnosis not present

## 2022-10-18 DIAGNOSIS — Z791 Long term (current) use of non-steroidal anti-inflammatories (NSAID): Secondary | ICD-10-CM | POA: Diagnosis not present

## 2022-10-20 DIAGNOSIS — Z87891 Personal history of nicotine dependence: Secondary | ICD-10-CM | POA: Diagnosis not present

## 2022-10-20 DIAGNOSIS — T8142XD Infection following a procedure, deep incisional surgical site, subsequent encounter: Secondary | ICD-10-CM | POA: Diagnosis not present

## 2022-10-20 DIAGNOSIS — Z7982 Long term (current) use of aspirin: Secondary | ICD-10-CM | POA: Diagnosis not present

## 2022-10-20 DIAGNOSIS — D649 Anemia, unspecified: Secondary | ICD-10-CM | POA: Diagnosis not present

## 2022-10-20 DIAGNOSIS — E039 Hypothyroidism, unspecified: Secondary | ICD-10-CM | POA: Diagnosis not present

## 2022-10-20 DIAGNOSIS — G4700 Insomnia, unspecified: Secondary | ICD-10-CM | POA: Diagnosis not present

## 2022-10-20 DIAGNOSIS — Z79891 Long term (current) use of opiate analgesic: Secondary | ICD-10-CM | POA: Diagnosis not present

## 2022-10-20 DIAGNOSIS — Z87440 Personal history of urinary (tract) infections: Secondary | ICD-10-CM | POA: Diagnosis not present

## 2022-10-20 DIAGNOSIS — K219 Gastro-esophageal reflux disease without esophagitis: Secondary | ICD-10-CM | POA: Diagnosis not present

## 2022-10-20 DIAGNOSIS — M4316 Spondylolisthesis, lumbar region: Secondary | ICD-10-CM | POA: Diagnosis not present

## 2022-10-20 DIAGNOSIS — F32A Depression, unspecified: Secondary | ICD-10-CM | POA: Diagnosis not present

## 2022-10-20 DIAGNOSIS — J45909 Unspecified asthma, uncomplicated: Secondary | ICD-10-CM | POA: Diagnosis not present

## 2022-10-20 DIAGNOSIS — I10 Essential (primary) hypertension: Secondary | ICD-10-CM | POA: Diagnosis not present

## 2022-10-20 DIAGNOSIS — Z9181 History of falling: Secondary | ICD-10-CM | POA: Diagnosis not present

## 2022-10-20 DIAGNOSIS — B9689 Other specified bacterial agents as the cause of diseases classified elsewhere: Secondary | ICD-10-CM | POA: Diagnosis not present

## 2022-10-20 DIAGNOSIS — Z791 Long term (current) use of non-steroidal anti-inflammatories (NSAID): Secondary | ICD-10-CM | POA: Diagnosis not present

## 2022-10-20 DIAGNOSIS — M797 Fibromyalgia: Secondary | ICD-10-CM | POA: Diagnosis not present

## 2022-10-20 DIAGNOSIS — K589 Irritable bowel syndrome without diarrhea: Secondary | ICD-10-CM | POA: Diagnosis not present

## 2022-10-23 DIAGNOSIS — J45909 Unspecified asthma, uncomplicated: Secondary | ICD-10-CM | POA: Diagnosis not present

## 2022-10-23 DIAGNOSIS — T8142XD Infection following a procedure, deep incisional surgical site, subsequent encounter: Secondary | ICD-10-CM | POA: Diagnosis not present

## 2022-10-23 DIAGNOSIS — I10 Essential (primary) hypertension: Secondary | ICD-10-CM | POA: Diagnosis not present

## 2022-10-23 DIAGNOSIS — K219 Gastro-esophageal reflux disease without esophagitis: Secondary | ICD-10-CM | POA: Diagnosis not present

## 2022-10-23 DIAGNOSIS — B9689 Other specified bacterial agents as the cause of diseases classified elsewhere: Secondary | ICD-10-CM | POA: Diagnosis not present

## 2022-10-23 DIAGNOSIS — Z79891 Long term (current) use of opiate analgesic: Secondary | ICD-10-CM | POA: Diagnosis not present

## 2022-10-23 DIAGNOSIS — Z791 Long term (current) use of non-steroidal anti-inflammatories (NSAID): Secondary | ICD-10-CM | POA: Diagnosis not present

## 2022-10-23 DIAGNOSIS — Z9181 History of falling: Secondary | ICD-10-CM | POA: Diagnosis not present

## 2022-10-23 DIAGNOSIS — F32A Depression, unspecified: Secondary | ICD-10-CM | POA: Diagnosis not present

## 2022-10-23 DIAGNOSIS — M4316 Spondylolisthesis, lumbar region: Secondary | ICD-10-CM | POA: Diagnosis not present

## 2022-10-23 DIAGNOSIS — Z7982 Long term (current) use of aspirin: Secondary | ICD-10-CM | POA: Diagnosis not present

## 2022-10-23 DIAGNOSIS — K589 Irritable bowel syndrome without diarrhea: Secondary | ICD-10-CM | POA: Diagnosis not present

## 2022-10-23 DIAGNOSIS — D649 Anemia, unspecified: Secondary | ICD-10-CM | POA: Diagnosis not present

## 2022-10-23 DIAGNOSIS — G4700 Insomnia, unspecified: Secondary | ICD-10-CM | POA: Diagnosis not present

## 2022-10-23 DIAGNOSIS — Z87891 Personal history of nicotine dependence: Secondary | ICD-10-CM | POA: Diagnosis not present

## 2022-10-23 DIAGNOSIS — E039 Hypothyroidism, unspecified: Secondary | ICD-10-CM | POA: Diagnosis not present

## 2022-10-23 DIAGNOSIS — M797 Fibromyalgia: Secondary | ICD-10-CM | POA: Diagnosis not present

## 2022-10-23 DIAGNOSIS — Z87440 Personal history of urinary (tract) infections: Secondary | ICD-10-CM | POA: Diagnosis not present

## 2022-10-25 DIAGNOSIS — M4316 Spondylolisthesis, lumbar region: Secondary | ICD-10-CM | POA: Diagnosis not present

## 2022-10-25 DIAGNOSIS — Z87891 Personal history of nicotine dependence: Secondary | ICD-10-CM | POA: Diagnosis not present

## 2022-10-25 DIAGNOSIS — M797 Fibromyalgia: Secondary | ICD-10-CM | POA: Diagnosis not present

## 2022-10-25 DIAGNOSIS — B9689 Other specified bacterial agents as the cause of diseases classified elsewhere: Secondary | ICD-10-CM | POA: Diagnosis not present

## 2022-10-25 DIAGNOSIS — G4700 Insomnia, unspecified: Secondary | ICD-10-CM | POA: Diagnosis not present

## 2022-10-25 DIAGNOSIS — Z9181 History of falling: Secondary | ICD-10-CM | POA: Diagnosis not present

## 2022-10-25 DIAGNOSIS — K589 Irritable bowel syndrome without diarrhea: Secondary | ICD-10-CM | POA: Diagnosis not present

## 2022-10-25 DIAGNOSIS — K219 Gastro-esophageal reflux disease without esophagitis: Secondary | ICD-10-CM | POA: Diagnosis not present

## 2022-10-25 DIAGNOSIS — T8142XD Infection following a procedure, deep incisional surgical site, subsequent encounter: Secondary | ICD-10-CM | POA: Diagnosis not present

## 2022-10-25 DIAGNOSIS — Z79891 Long term (current) use of opiate analgesic: Secondary | ICD-10-CM | POA: Diagnosis not present

## 2022-10-25 DIAGNOSIS — Z791 Long term (current) use of non-steroidal anti-inflammatories (NSAID): Secondary | ICD-10-CM | POA: Diagnosis not present

## 2022-10-25 DIAGNOSIS — E039 Hypothyroidism, unspecified: Secondary | ICD-10-CM | POA: Diagnosis not present

## 2022-10-25 DIAGNOSIS — Z87440 Personal history of urinary (tract) infections: Secondary | ICD-10-CM | POA: Diagnosis not present

## 2022-10-25 DIAGNOSIS — Z7982 Long term (current) use of aspirin: Secondary | ICD-10-CM | POA: Diagnosis not present

## 2022-10-25 DIAGNOSIS — F32A Depression, unspecified: Secondary | ICD-10-CM | POA: Diagnosis not present

## 2022-10-25 DIAGNOSIS — D649 Anemia, unspecified: Secondary | ICD-10-CM | POA: Diagnosis not present

## 2022-10-25 DIAGNOSIS — I10 Essential (primary) hypertension: Secondary | ICD-10-CM | POA: Diagnosis not present

## 2022-10-25 DIAGNOSIS — J45909 Unspecified asthma, uncomplicated: Secondary | ICD-10-CM | POA: Diagnosis not present

## 2022-10-28 DIAGNOSIS — G4700 Insomnia, unspecified: Secondary | ICD-10-CM | POA: Diagnosis not present

## 2022-10-28 DIAGNOSIS — Z7982 Long term (current) use of aspirin: Secondary | ICD-10-CM | POA: Diagnosis not present

## 2022-10-28 DIAGNOSIS — Z87440 Personal history of urinary (tract) infections: Secondary | ICD-10-CM | POA: Diagnosis not present

## 2022-10-28 DIAGNOSIS — Z791 Long term (current) use of non-steroidal anti-inflammatories (NSAID): Secondary | ICD-10-CM | POA: Diagnosis not present

## 2022-10-28 DIAGNOSIS — M4316 Spondylolisthesis, lumbar region: Secondary | ICD-10-CM | POA: Diagnosis not present

## 2022-10-28 DIAGNOSIS — J45909 Unspecified asthma, uncomplicated: Secondary | ICD-10-CM | POA: Diagnosis not present

## 2022-10-28 DIAGNOSIS — Z79891 Long term (current) use of opiate analgesic: Secondary | ICD-10-CM | POA: Diagnosis not present

## 2022-10-28 DIAGNOSIS — Z9181 History of falling: Secondary | ICD-10-CM | POA: Diagnosis not present

## 2022-10-28 DIAGNOSIS — K589 Irritable bowel syndrome without diarrhea: Secondary | ICD-10-CM | POA: Diagnosis not present

## 2022-10-28 DIAGNOSIS — Z87891 Personal history of nicotine dependence: Secondary | ICD-10-CM | POA: Diagnosis not present

## 2022-10-28 DIAGNOSIS — T8142XD Infection following a procedure, deep incisional surgical site, subsequent encounter: Secondary | ICD-10-CM | POA: Diagnosis not present

## 2022-10-28 DIAGNOSIS — I10 Essential (primary) hypertension: Secondary | ICD-10-CM | POA: Diagnosis not present

## 2022-10-28 DIAGNOSIS — F32A Depression, unspecified: Secondary | ICD-10-CM | POA: Diagnosis not present

## 2022-10-28 DIAGNOSIS — D649 Anemia, unspecified: Secondary | ICD-10-CM | POA: Diagnosis not present

## 2022-10-28 DIAGNOSIS — M797 Fibromyalgia: Secondary | ICD-10-CM | POA: Diagnosis not present

## 2022-10-28 DIAGNOSIS — K219 Gastro-esophageal reflux disease without esophagitis: Secondary | ICD-10-CM | POA: Diagnosis not present

## 2022-10-28 DIAGNOSIS — E039 Hypothyroidism, unspecified: Secondary | ICD-10-CM | POA: Diagnosis not present

## 2022-10-28 DIAGNOSIS — B9689 Other specified bacterial agents as the cause of diseases classified elsewhere: Secondary | ICD-10-CM | POA: Diagnosis not present

## 2022-10-30 DIAGNOSIS — Z87891 Personal history of nicotine dependence: Secondary | ICD-10-CM | POA: Diagnosis not present

## 2022-10-30 DIAGNOSIS — Z9181 History of falling: Secondary | ICD-10-CM | POA: Diagnosis not present

## 2022-10-30 DIAGNOSIS — T8142XD Infection following a procedure, deep incisional surgical site, subsequent encounter: Secondary | ICD-10-CM | POA: Diagnosis not present

## 2022-10-30 DIAGNOSIS — Z7982 Long term (current) use of aspirin: Secondary | ICD-10-CM | POA: Diagnosis not present

## 2022-10-30 DIAGNOSIS — B9689 Other specified bacterial agents as the cause of diseases classified elsewhere: Secondary | ICD-10-CM | POA: Diagnosis not present

## 2022-10-30 DIAGNOSIS — Z791 Long term (current) use of non-steroidal anti-inflammatories (NSAID): Secondary | ICD-10-CM | POA: Diagnosis not present

## 2022-10-30 DIAGNOSIS — I10 Essential (primary) hypertension: Secondary | ICD-10-CM | POA: Diagnosis not present

## 2022-10-30 DIAGNOSIS — Z79891 Long term (current) use of opiate analgesic: Secondary | ICD-10-CM | POA: Diagnosis not present

## 2022-10-30 DIAGNOSIS — K219 Gastro-esophageal reflux disease without esophagitis: Secondary | ICD-10-CM | POA: Diagnosis not present

## 2022-10-30 DIAGNOSIS — K589 Irritable bowel syndrome without diarrhea: Secondary | ICD-10-CM | POA: Diagnosis not present

## 2022-10-30 DIAGNOSIS — E039 Hypothyroidism, unspecified: Secondary | ICD-10-CM | POA: Diagnosis not present

## 2022-10-30 DIAGNOSIS — F32A Depression, unspecified: Secondary | ICD-10-CM | POA: Diagnosis not present

## 2022-10-30 DIAGNOSIS — M797 Fibromyalgia: Secondary | ICD-10-CM | POA: Diagnosis not present

## 2022-10-30 DIAGNOSIS — D649 Anemia, unspecified: Secondary | ICD-10-CM | POA: Diagnosis not present

## 2022-10-30 DIAGNOSIS — G4700 Insomnia, unspecified: Secondary | ICD-10-CM | POA: Diagnosis not present

## 2022-10-30 DIAGNOSIS — J45909 Unspecified asthma, uncomplicated: Secondary | ICD-10-CM | POA: Diagnosis not present

## 2022-10-30 DIAGNOSIS — M4316 Spondylolisthesis, lumbar region: Secondary | ICD-10-CM | POA: Diagnosis not present

## 2022-10-30 DIAGNOSIS — Z87440 Personal history of urinary (tract) infections: Secondary | ICD-10-CM | POA: Diagnosis not present

## 2022-10-31 DIAGNOSIS — M48062 Spinal stenosis, lumbar region with neurogenic claudication: Secondary | ICD-10-CM | POA: Diagnosis not present

## 2022-11-01 DIAGNOSIS — Z7982 Long term (current) use of aspirin: Secondary | ICD-10-CM | POA: Diagnosis not present

## 2022-11-01 DIAGNOSIS — K219 Gastro-esophageal reflux disease without esophagitis: Secondary | ICD-10-CM | POA: Diagnosis not present

## 2022-11-01 DIAGNOSIS — B9689 Other specified bacterial agents as the cause of diseases classified elsewhere: Secondary | ICD-10-CM | POA: Diagnosis not present

## 2022-11-01 DIAGNOSIS — M4316 Spondylolisthesis, lumbar region: Secondary | ICD-10-CM | POA: Diagnosis not present

## 2022-11-01 DIAGNOSIS — J45909 Unspecified asthma, uncomplicated: Secondary | ICD-10-CM | POA: Diagnosis not present

## 2022-11-01 DIAGNOSIS — T8142XD Infection following a procedure, deep incisional surgical site, subsequent encounter: Secondary | ICD-10-CM | POA: Diagnosis not present

## 2022-11-01 DIAGNOSIS — Z87440 Personal history of urinary (tract) infections: Secondary | ICD-10-CM | POA: Diagnosis not present

## 2022-11-01 DIAGNOSIS — Z9181 History of falling: Secondary | ICD-10-CM | POA: Diagnosis not present

## 2022-11-01 DIAGNOSIS — M797 Fibromyalgia: Secondary | ICD-10-CM | POA: Diagnosis not present

## 2022-11-01 DIAGNOSIS — F32A Depression, unspecified: Secondary | ICD-10-CM | POA: Diagnosis not present

## 2022-11-01 DIAGNOSIS — D649 Anemia, unspecified: Secondary | ICD-10-CM | POA: Diagnosis not present

## 2022-11-01 DIAGNOSIS — Z79891 Long term (current) use of opiate analgesic: Secondary | ICD-10-CM | POA: Diagnosis not present

## 2022-11-01 DIAGNOSIS — E039 Hypothyroidism, unspecified: Secondary | ICD-10-CM | POA: Diagnosis not present

## 2022-11-01 DIAGNOSIS — I10 Essential (primary) hypertension: Secondary | ICD-10-CM | POA: Diagnosis not present

## 2022-11-01 DIAGNOSIS — K589 Irritable bowel syndrome without diarrhea: Secondary | ICD-10-CM | POA: Diagnosis not present

## 2022-11-01 DIAGNOSIS — Z87891 Personal history of nicotine dependence: Secondary | ICD-10-CM | POA: Diagnosis not present

## 2022-11-01 DIAGNOSIS — Z791 Long term (current) use of non-steroidal anti-inflammatories (NSAID): Secondary | ICD-10-CM | POA: Diagnosis not present

## 2022-11-01 DIAGNOSIS — G4700 Insomnia, unspecified: Secondary | ICD-10-CM | POA: Diagnosis not present

## 2022-11-04 ENCOUNTER — Other Ambulatory Visit: Payer: Self-pay | Admitting: Internal Medicine

## 2022-11-04 DIAGNOSIS — M25561 Pain in right knee: Secondary | ICD-10-CM | POA: Diagnosis not present

## 2022-11-04 DIAGNOSIS — M545 Low back pain, unspecified: Secondary | ICD-10-CM | POA: Diagnosis not present

## 2022-11-04 DIAGNOSIS — M62838 Other muscle spasm: Secondary | ICD-10-CM | POA: Diagnosis not present

## 2022-11-04 DIAGNOSIS — E559 Vitamin D deficiency, unspecified: Secondary | ICD-10-CM | POA: Diagnosis not present

## 2022-11-04 DIAGNOSIS — G8929 Other chronic pain: Secondary | ICD-10-CM | POA: Diagnosis not present

## 2022-11-04 DIAGNOSIS — Z9181 History of falling: Secondary | ICD-10-CM | POA: Diagnosis not present

## 2022-11-04 DIAGNOSIS — Z79899 Other long term (current) drug therapy: Secondary | ICD-10-CM | POA: Diagnosis not present

## 2022-11-04 DIAGNOSIS — M25562 Pain in left knee: Secondary | ICD-10-CM | POA: Diagnosis not present

## 2022-11-04 DIAGNOSIS — M797 Fibromyalgia: Secondary | ICD-10-CM | POA: Diagnosis not present

## 2022-11-05 DIAGNOSIS — Z9181 History of falling: Secondary | ICD-10-CM | POA: Diagnosis not present

## 2022-11-05 DIAGNOSIS — K589 Irritable bowel syndrome without diarrhea: Secondary | ICD-10-CM | POA: Diagnosis not present

## 2022-11-05 DIAGNOSIS — G4700 Insomnia, unspecified: Secondary | ICD-10-CM | POA: Diagnosis not present

## 2022-11-05 DIAGNOSIS — Z79891 Long term (current) use of opiate analgesic: Secondary | ICD-10-CM | POA: Diagnosis not present

## 2022-11-05 DIAGNOSIS — I10 Essential (primary) hypertension: Secondary | ICD-10-CM | POA: Diagnosis not present

## 2022-11-05 DIAGNOSIS — E039 Hypothyroidism, unspecified: Secondary | ICD-10-CM | POA: Diagnosis not present

## 2022-11-05 DIAGNOSIS — M797 Fibromyalgia: Secondary | ICD-10-CM | POA: Diagnosis not present

## 2022-11-05 DIAGNOSIS — B9689 Other specified bacterial agents as the cause of diseases classified elsewhere: Secondary | ICD-10-CM | POA: Diagnosis not present

## 2022-11-05 DIAGNOSIS — Z791 Long term (current) use of non-steroidal anti-inflammatories (NSAID): Secondary | ICD-10-CM | POA: Diagnosis not present

## 2022-11-05 DIAGNOSIS — D649 Anemia, unspecified: Secondary | ICD-10-CM | POA: Diagnosis not present

## 2022-11-05 DIAGNOSIS — Z87440 Personal history of urinary (tract) infections: Secondary | ICD-10-CM | POA: Diagnosis not present

## 2022-11-05 DIAGNOSIS — M4316 Spondylolisthesis, lumbar region: Secondary | ICD-10-CM | POA: Diagnosis not present

## 2022-11-05 DIAGNOSIS — Z87891 Personal history of nicotine dependence: Secondary | ICD-10-CM | POA: Diagnosis not present

## 2022-11-05 DIAGNOSIS — F32A Depression, unspecified: Secondary | ICD-10-CM | POA: Diagnosis not present

## 2022-11-05 DIAGNOSIS — Z7982 Long term (current) use of aspirin: Secondary | ICD-10-CM | POA: Diagnosis not present

## 2022-11-05 DIAGNOSIS — T8142XD Infection following a procedure, deep incisional surgical site, subsequent encounter: Secondary | ICD-10-CM | POA: Diagnosis not present

## 2022-11-05 DIAGNOSIS — K219 Gastro-esophageal reflux disease without esophagitis: Secondary | ICD-10-CM | POA: Diagnosis not present

## 2022-11-05 DIAGNOSIS — J45909 Unspecified asthma, uncomplicated: Secondary | ICD-10-CM | POA: Diagnosis not present

## 2022-11-06 DIAGNOSIS — F32A Depression, unspecified: Secondary | ICD-10-CM | POA: Diagnosis not present

## 2022-11-06 DIAGNOSIS — E039 Hypothyroidism, unspecified: Secondary | ICD-10-CM | POA: Diagnosis not present

## 2022-11-06 DIAGNOSIS — G4700 Insomnia, unspecified: Secondary | ICD-10-CM | POA: Diagnosis not present

## 2022-11-06 DIAGNOSIS — M4316 Spondylolisthesis, lumbar region: Secondary | ICD-10-CM | POA: Diagnosis not present

## 2022-11-06 DIAGNOSIS — T8142XD Infection following a procedure, deep incisional surgical site, subsequent encounter: Secondary | ICD-10-CM | POA: Diagnosis not present

## 2022-11-06 DIAGNOSIS — D649 Anemia, unspecified: Secondary | ICD-10-CM | POA: Diagnosis not present

## 2022-11-06 DIAGNOSIS — Z7982 Long term (current) use of aspirin: Secondary | ICD-10-CM | POA: Diagnosis not present

## 2022-11-06 DIAGNOSIS — I10 Essential (primary) hypertension: Secondary | ICD-10-CM | POA: Diagnosis not present

## 2022-11-06 DIAGNOSIS — Z87891 Personal history of nicotine dependence: Secondary | ICD-10-CM | POA: Diagnosis not present

## 2022-11-06 DIAGNOSIS — Z87440 Personal history of urinary (tract) infections: Secondary | ICD-10-CM | POA: Diagnosis not present

## 2022-11-06 DIAGNOSIS — Z9181 History of falling: Secondary | ICD-10-CM | POA: Diagnosis not present

## 2022-11-06 DIAGNOSIS — B9689 Other specified bacterial agents as the cause of diseases classified elsewhere: Secondary | ICD-10-CM | POA: Diagnosis not present

## 2022-11-06 DIAGNOSIS — M797 Fibromyalgia: Secondary | ICD-10-CM | POA: Diagnosis not present

## 2022-11-06 DIAGNOSIS — Z79891 Long term (current) use of opiate analgesic: Secondary | ICD-10-CM | POA: Diagnosis not present

## 2022-11-06 DIAGNOSIS — J45909 Unspecified asthma, uncomplicated: Secondary | ICD-10-CM | POA: Diagnosis not present

## 2022-11-06 DIAGNOSIS — K219 Gastro-esophageal reflux disease without esophagitis: Secondary | ICD-10-CM | POA: Diagnosis not present

## 2022-11-06 DIAGNOSIS — K589 Irritable bowel syndrome without diarrhea: Secondary | ICD-10-CM | POA: Diagnosis not present

## 2022-11-06 DIAGNOSIS — Z791 Long term (current) use of non-steroidal anti-inflammatories (NSAID): Secondary | ICD-10-CM | POA: Diagnosis not present

## 2022-11-12 DIAGNOSIS — Z9181 History of falling: Secondary | ICD-10-CM | POA: Diagnosis not present

## 2022-11-12 DIAGNOSIS — Z7982 Long term (current) use of aspirin: Secondary | ICD-10-CM | POA: Diagnosis not present

## 2022-11-12 DIAGNOSIS — G4700 Insomnia, unspecified: Secondary | ICD-10-CM | POA: Diagnosis not present

## 2022-11-12 DIAGNOSIS — K589 Irritable bowel syndrome without diarrhea: Secondary | ICD-10-CM | POA: Diagnosis not present

## 2022-11-12 DIAGNOSIS — J45909 Unspecified asthma, uncomplicated: Secondary | ICD-10-CM | POA: Diagnosis not present

## 2022-11-12 DIAGNOSIS — B9689 Other specified bacterial agents as the cause of diseases classified elsewhere: Secondary | ICD-10-CM | POA: Diagnosis not present

## 2022-11-12 DIAGNOSIS — Z79891 Long term (current) use of opiate analgesic: Secondary | ICD-10-CM | POA: Diagnosis not present

## 2022-11-12 DIAGNOSIS — F32A Depression, unspecified: Secondary | ICD-10-CM | POA: Diagnosis not present

## 2022-11-12 DIAGNOSIS — Z87440 Personal history of urinary (tract) infections: Secondary | ICD-10-CM | POA: Diagnosis not present

## 2022-11-12 DIAGNOSIS — E039 Hypothyroidism, unspecified: Secondary | ICD-10-CM | POA: Diagnosis not present

## 2022-11-12 DIAGNOSIS — D649 Anemia, unspecified: Secondary | ICD-10-CM | POA: Diagnosis not present

## 2022-11-12 DIAGNOSIS — Z87891 Personal history of nicotine dependence: Secondary | ICD-10-CM | POA: Diagnosis not present

## 2022-11-12 DIAGNOSIS — Z791 Long term (current) use of non-steroidal anti-inflammatories (NSAID): Secondary | ICD-10-CM | POA: Diagnosis not present

## 2022-11-12 DIAGNOSIS — T8142XD Infection following a procedure, deep incisional surgical site, subsequent encounter: Secondary | ICD-10-CM | POA: Diagnosis not present

## 2022-11-12 DIAGNOSIS — I10 Essential (primary) hypertension: Secondary | ICD-10-CM | POA: Diagnosis not present

## 2022-11-12 DIAGNOSIS — M4316 Spondylolisthesis, lumbar region: Secondary | ICD-10-CM | POA: Diagnosis not present

## 2022-11-12 DIAGNOSIS — M797 Fibromyalgia: Secondary | ICD-10-CM | POA: Diagnosis not present

## 2022-11-12 DIAGNOSIS — K219 Gastro-esophageal reflux disease without esophagitis: Secondary | ICD-10-CM | POA: Diagnosis not present

## 2022-11-15 DIAGNOSIS — B9689 Other specified bacterial agents as the cause of diseases classified elsewhere: Secondary | ICD-10-CM | POA: Diagnosis not present

## 2022-11-15 DIAGNOSIS — T8142XD Infection following a procedure, deep incisional surgical site, subsequent encounter: Secondary | ICD-10-CM | POA: Diagnosis not present

## 2022-11-15 DIAGNOSIS — Z87440 Personal history of urinary (tract) infections: Secondary | ICD-10-CM | POA: Diagnosis not present

## 2022-11-15 DIAGNOSIS — Z791 Long term (current) use of non-steroidal anti-inflammatories (NSAID): Secondary | ICD-10-CM | POA: Diagnosis not present

## 2022-11-15 DIAGNOSIS — K219 Gastro-esophageal reflux disease without esophagitis: Secondary | ICD-10-CM | POA: Diagnosis not present

## 2022-11-15 DIAGNOSIS — J45909 Unspecified asthma, uncomplicated: Secondary | ICD-10-CM | POA: Diagnosis not present

## 2022-11-15 DIAGNOSIS — E039 Hypothyroidism, unspecified: Secondary | ICD-10-CM | POA: Diagnosis not present

## 2022-11-15 DIAGNOSIS — M797 Fibromyalgia: Secondary | ICD-10-CM | POA: Diagnosis not present

## 2022-11-15 DIAGNOSIS — Z79891 Long term (current) use of opiate analgesic: Secondary | ICD-10-CM | POA: Diagnosis not present

## 2022-11-15 DIAGNOSIS — Z87891 Personal history of nicotine dependence: Secondary | ICD-10-CM | POA: Diagnosis not present

## 2022-11-15 DIAGNOSIS — F32A Depression, unspecified: Secondary | ICD-10-CM | POA: Diagnosis not present

## 2022-11-15 DIAGNOSIS — Z7982 Long term (current) use of aspirin: Secondary | ICD-10-CM | POA: Diagnosis not present

## 2022-11-15 DIAGNOSIS — G4700 Insomnia, unspecified: Secondary | ICD-10-CM | POA: Diagnosis not present

## 2022-11-15 DIAGNOSIS — Z9181 History of falling: Secondary | ICD-10-CM | POA: Diagnosis not present

## 2022-11-15 DIAGNOSIS — M4316 Spondylolisthesis, lumbar region: Secondary | ICD-10-CM | POA: Diagnosis not present

## 2022-11-15 DIAGNOSIS — I10 Essential (primary) hypertension: Secondary | ICD-10-CM | POA: Diagnosis not present

## 2022-11-15 DIAGNOSIS — K589 Irritable bowel syndrome without diarrhea: Secondary | ICD-10-CM | POA: Diagnosis not present

## 2022-11-15 DIAGNOSIS — D649 Anemia, unspecified: Secondary | ICD-10-CM | POA: Diagnosis not present

## 2022-11-20 DIAGNOSIS — K589 Irritable bowel syndrome without diarrhea: Secondary | ICD-10-CM | POA: Diagnosis not present

## 2022-11-20 DIAGNOSIS — J45909 Unspecified asthma, uncomplicated: Secondary | ICD-10-CM | POA: Diagnosis not present

## 2022-11-20 DIAGNOSIS — K219 Gastro-esophageal reflux disease without esophagitis: Secondary | ICD-10-CM | POA: Diagnosis not present

## 2022-11-20 DIAGNOSIS — D649 Anemia, unspecified: Secondary | ICD-10-CM | POA: Diagnosis not present

## 2022-11-20 DIAGNOSIS — Z791 Long term (current) use of non-steroidal anti-inflammatories (NSAID): Secondary | ICD-10-CM | POA: Diagnosis not present

## 2022-11-20 DIAGNOSIS — Z79891 Long term (current) use of opiate analgesic: Secondary | ICD-10-CM | POA: Diagnosis not present

## 2022-11-20 DIAGNOSIS — F32A Depression, unspecified: Secondary | ICD-10-CM | POA: Diagnosis not present

## 2022-11-20 DIAGNOSIS — M797 Fibromyalgia: Secondary | ICD-10-CM | POA: Diagnosis not present

## 2022-11-20 DIAGNOSIS — Z9181 History of falling: Secondary | ICD-10-CM | POA: Diagnosis not present

## 2022-11-20 DIAGNOSIS — M4316 Spondylolisthesis, lumbar region: Secondary | ICD-10-CM | POA: Diagnosis not present

## 2022-11-20 DIAGNOSIS — Z7982 Long term (current) use of aspirin: Secondary | ICD-10-CM | POA: Diagnosis not present

## 2022-11-20 DIAGNOSIS — E039 Hypothyroidism, unspecified: Secondary | ICD-10-CM | POA: Diagnosis not present

## 2022-11-20 DIAGNOSIS — Z87440 Personal history of urinary (tract) infections: Secondary | ICD-10-CM | POA: Diagnosis not present

## 2022-11-20 DIAGNOSIS — B9689 Other specified bacterial agents as the cause of diseases classified elsewhere: Secondary | ICD-10-CM | POA: Diagnosis not present

## 2022-11-20 DIAGNOSIS — T8142XD Infection following a procedure, deep incisional surgical site, subsequent encounter: Secondary | ICD-10-CM | POA: Diagnosis not present

## 2022-11-20 DIAGNOSIS — Z87891 Personal history of nicotine dependence: Secondary | ICD-10-CM | POA: Diagnosis not present

## 2022-11-20 DIAGNOSIS — I10 Essential (primary) hypertension: Secondary | ICD-10-CM | POA: Diagnosis not present

## 2022-11-20 DIAGNOSIS — G4700 Insomnia, unspecified: Secondary | ICD-10-CM | POA: Diagnosis not present

## 2022-11-22 DIAGNOSIS — Z791 Long term (current) use of non-steroidal anti-inflammatories (NSAID): Secondary | ICD-10-CM | POA: Diagnosis not present

## 2022-11-22 DIAGNOSIS — B9689 Other specified bacterial agents as the cause of diseases classified elsewhere: Secondary | ICD-10-CM | POA: Diagnosis not present

## 2022-11-22 DIAGNOSIS — T8142XD Infection following a procedure, deep incisional surgical site, subsequent encounter: Secondary | ICD-10-CM | POA: Diagnosis not present

## 2022-11-22 DIAGNOSIS — I10 Essential (primary) hypertension: Secondary | ICD-10-CM | POA: Diagnosis not present

## 2022-11-22 DIAGNOSIS — M797 Fibromyalgia: Secondary | ICD-10-CM | POA: Diagnosis not present

## 2022-11-22 DIAGNOSIS — D649 Anemia, unspecified: Secondary | ICD-10-CM | POA: Diagnosis not present

## 2022-11-22 DIAGNOSIS — Z7982 Long term (current) use of aspirin: Secondary | ICD-10-CM | POA: Diagnosis not present

## 2022-11-22 DIAGNOSIS — K219 Gastro-esophageal reflux disease without esophagitis: Secondary | ICD-10-CM | POA: Diagnosis not present

## 2022-11-22 DIAGNOSIS — Z9181 History of falling: Secondary | ICD-10-CM | POA: Diagnosis not present

## 2022-11-22 DIAGNOSIS — Z87440 Personal history of urinary (tract) infections: Secondary | ICD-10-CM | POA: Diagnosis not present

## 2022-11-22 DIAGNOSIS — Z87891 Personal history of nicotine dependence: Secondary | ICD-10-CM | POA: Diagnosis not present

## 2022-11-22 DIAGNOSIS — J45909 Unspecified asthma, uncomplicated: Secondary | ICD-10-CM | POA: Diagnosis not present

## 2022-11-22 DIAGNOSIS — K589 Irritable bowel syndrome without diarrhea: Secondary | ICD-10-CM | POA: Diagnosis not present

## 2022-11-22 DIAGNOSIS — F32A Depression, unspecified: Secondary | ICD-10-CM | POA: Diagnosis not present

## 2022-11-22 DIAGNOSIS — M4316 Spondylolisthesis, lumbar region: Secondary | ICD-10-CM | POA: Diagnosis not present

## 2022-11-22 DIAGNOSIS — Z79891 Long term (current) use of opiate analgesic: Secondary | ICD-10-CM | POA: Diagnosis not present

## 2022-11-22 DIAGNOSIS — G4700 Insomnia, unspecified: Secondary | ICD-10-CM | POA: Diagnosis not present

## 2022-11-22 DIAGNOSIS — E039 Hypothyroidism, unspecified: Secondary | ICD-10-CM | POA: Diagnosis not present

## 2022-11-28 ENCOUNTER — Other Ambulatory Visit: Payer: Self-pay | Admitting: Internal Medicine

## 2022-11-28 NOTE — Telephone Encounter (Signed)
Okay to refill? 

## 2022-11-28 NOTE — Telephone Encounter (Signed)
Refill sent to pharamcy

## 2022-12-02 DIAGNOSIS — L853 Xerosis cutis: Secondary | ICD-10-CM | POA: Diagnosis not present

## 2022-12-02 DIAGNOSIS — I700 Atherosclerosis of aorta: Secondary | ICD-10-CM | POA: Diagnosis not present

## 2022-12-12 DIAGNOSIS — M25561 Pain in right knee: Secondary | ICD-10-CM | POA: Diagnosis not present

## 2022-12-12 DIAGNOSIS — M25562 Pain in left knee: Secondary | ICD-10-CM | POA: Diagnosis not present

## 2022-12-12 DIAGNOSIS — Z9181 History of falling: Secondary | ICD-10-CM | POA: Diagnosis not present

## 2022-12-12 DIAGNOSIS — G8929 Other chronic pain: Secondary | ICD-10-CM | POA: Diagnosis not present

## 2022-12-12 DIAGNOSIS — M62838 Other muscle spasm: Secondary | ICD-10-CM | POA: Diagnosis not present

## 2022-12-12 DIAGNOSIS — M545 Low back pain, unspecified: Secondary | ICD-10-CM | POA: Diagnosis not present

## 2022-12-12 DIAGNOSIS — Z79899 Other long term (current) drug therapy: Secondary | ICD-10-CM | POA: Diagnosis not present

## 2022-12-12 DIAGNOSIS — F32A Depression, unspecified: Secondary | ICD-10-CM | POA: Diagnosis not present

## 2022-12-12 DIAGNOSIS — Z0000 Encounter for general adult medical examination without abnormal findings: Secondary | ICD-10-CM | POA: Diagnosis not present

## 2022-12-12 DIAGNOSIS — G4700 Insomnia, unspecified: Secondary | ICD-10-CM | POA: Diagnosis not present

## 2022-12-12 DIAGNOSIS — M797 Fibromyalgia: Secondary | ICD-10-CM | POA: Diagnosis not present

## 2022-12-16 DIAGNOSIS — Z79899 Other long term (current) drug therapy: Secondary | ICD-10-CM | POA: Diagnosis not present

## 2022-12-23 DIAGNOSIS — M4726 Other spondylosis with radiculopathy, lumbar region: Secondary | ICD-10-CM | POA: Diagnosis not present

## 2022-12-23 DIAGNOSIS — M48062 Spinal stenosis, lumbar region with neurogenic claudication: Secondary | ICD-10-CM | POA: Diagnosis not present

## 2023-01-10 DIAGNOSIS — G8929 Other chronic pain: Secondary | ICD-10-CM | POA: Diagnosis not present

## 2023-01-10 DIAGNOSIS — M62838 Other muscle spasm: Secondary | ICD-10-CM | POA: Diagnosis not present

## 2023-01-10 DIAGNOSIS — Z79899 Other long term (current) drug therapy: Secondary | ICD-10-CM | POA: Diagnosis not present

## 2023-01-10 DIAGNOSIS — E559 Vitamin D deficiency, unspecified: Secondary | ICD-10-CM | POA: Diagnosis not present

## 2023-01-10 DIAGNOSIS — G4700 Insomnia, unspecified: Secondary | ICD-10-CM | POA: Diagnosis not present

## 2023-01-10 DIAGNOSIS — Z9181 History of falling: Secondary | ICD-10-CM | POA: Diagnosis not present

## 2023-01-10 DIAGNOSIS — M797 Fibromyalgia: Secondary | ICD-10-CM | POA: Diagnosis not present

## 2023-01-10 DIAGNOSIS — M545 Low back pain, unspecified: Secondary | ICD-10-CM | POA: Diagnosis not present

## 2023-01-10 DIAGNOSIS — M25562 Pain in left knee: Secondary | ICD-10-CM | POA: Diagnosis not present

## 2023-01-10 DIAGNOSIS — F32A Depression, unspecified: Secondary | ICD-10-CM | POA: Diagnosis not present

## 2023-01-10 DIAGNOSIS — M25561 Pain in right knee: Secondary | ICD-10-CM | POA: Diagnosis not present

## 2023-01-14 DIAGNOSIS — Z79899 Other long term (current) drug therapy: Secondary | ICD-10-CM | POA: Diagnosis not present

## 2023-01-16 ENCOUNTER — Other Ambulatory Visit: Payer: Self-pay

## 2023-01-16 ENCOUNTER — Ambulatory Visit (INDEPENDENT_AMBULATORY_CARE_PROVIDER_SITE_OTHER): Payer: 59 | Admitting: Internal Medicine

## 2023-01-16 ENCOUNTER — Encounter: Payer: Self-pay | Admitting: Internal Medicine

## 2023-01-16 VITALS — BP 113/76 | HR 78 | Resp 16 | Ht 62.0 in | Wt 213.6 lb

## 2023-01-16 DIAGNOSIS — R32 Unspecified urinary incontinence: Secondary | ICD-10-CM | POA: Diagnosis not present

## 2023-01-16 DIAGNOSIS — M4620 Osteomyelitis of vertebra, site unspecified: Secondary | ICD-10-CM

## 2023-01-16 NOTE — Progress Notes (Signed)
Patient Active Problem List   Diagnosis Date Noted   Wound infection after surgery 05/14/2022   Delayed surgical wound healing 05/12/2022   Postoperative wound infection 04/08/2022   Scoliosis due to degenerative disease of spine in adult patient 03/30/2022   HNP (herniated nucleus pulposus), cervical 12/08/2020   Lumbago with sciatica, unspecified side 07/28/2020   PICC (peripherally inserted central catheter) in place 03/16/2020   Encounter for long-term (current) use of antibiotics 03/16/2020   Polypharmacy 03/09/2020   At risk for adverse drug event 03/09/2020   MSSA bacteremia 02/29/2020   Postoperative infection 02/28/2020   Abnormal gait due to muscle weakness 02/26/2020   HNP (herniated nucleus pulposus), lumbar 02/16/2020   Muscle weakness of lower extremity 02/14/2020   Spondylolisthesis of lumbar region 10/30/2017   Painful orthopaedic hardware right ankle 05/27/2016   Closed fracture of distal end of right fibula and tibia    Ankle syndesmosis disruption    ASCUS favor benign 01/06/2014   Leg swelling 12/22/2013   Seasonal allergies    Anemia    Chronic back pain due to DJD with history surgery    Hyperthyroidism    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 03/24/2012   Status post left total prosthetic replacement of knee joint using cement 03/24/2012   Fibromyalgia    GERD (gastroesophageal reflux disease)    Anxiety    Depression    Hypertension    Lumbar degenerative disc disease 09/25/2011    Patient's Medications  New Prescriptions   No medications on file  Previous Medications   ACETAMINOPHEN (TYLENOL) 500 MG TABLET    Take 1,000 mg by mouth every 8 (eight) hours as needed for moderate pain.   ALBUTEROL (VENTOLIN HFA) 108 (90 BASE) MCG/ACT INHALER    Inhale 2 puffs into the lungs every 6 (six) hours as needed for wheezing or shortness of breath.   ASCORBIC ACID (VITAMIN C) 500 MG TABLET    Take 500 mg by mouth daily.    ASPIRIN EC 81 MG TABLET    Take 81 mg by mouth daily.   ATENOLOL (TENORMIN) 50 MG TABLET    Take 1 tablet (50 mg total) by mouth daily.   CELEBREX 200 MG CAPSULE    Take 1 capsule (200 mg total) by mouth 2 (two) times daily.   CHLORZOXAZONE (PARAFON) 500 MG TABLET    Take 1 tablet (500 mg total) by mouth 3 (three) times daily.   CYCLOBENZAPRINE (FLEXERIL) 10 MG TABLET    Take 10 mg by mouth 3 (three) times daily.   DIAZEPAM (VALIUM) 5 MG TABLET    Take 1 tablet (5 mg total) by mouth 2 (two) times daily.   DICLOFENAC (FLECTOR) 1.3 % PTCH    Place 1 patch onto the skin 2 (two) times daily as needed (pain.).   DICLOFENAC SODIUM (VOLTAREN) 1 % GEL    Apply 2 g topically 4 (four) times daily as needed (pain).   DIETHYLPROPION HCL CR 75 MG TB24    Take 75 mg by mouth daily.   DIPHENHYDRAMINE (BENADRYL) 25 MG TABLET    Take 50 mg by mouth in the morning, at noon, in the evening, and at bedtime.   FUROSEMIDE (LASIX) 20 MG TABLET    Take 2 tablets (40 mg total) by mouth daily.   GABAPENTIN (NEURONTIN) 600 MG TABLET    Take 2 tablets (1,200 mg total) by mouth 3 (three) times daily.  GUAIFENESIN (MUCINEX) 600 MG 12 HR TABLET    Take 600 mg by mouth 2 (two) times daily as needed for cough.   HYDROCHLOROTHIAZIDE (HYDRODIURIL) 25 MG TABLET    Take 1 tablet (25 mg total) by mouth daily. One tablet by mouth in the morning.   HYDROXYZINE (ATARAX/VISTARIL) 50 MG TABLET    Take 50 mg by mouth 2 (two) times daily.   LUBIPROSTONE (AMITIZA) 24 MCG CAPSULE    Take 1 capsule (24 mcg total) by mouth 2 (two) times daily with a meal.   METHOCARBAMOL (ROBAXIN) 500 MG TABLET    Take 1 tablet (500 mg total) by mouth every 6 (six) hours as needed for muscle spasms.   MULTIPLE VITAMIN (MULTIVITAMIN WITH MINERALS) TABS TABLET    Take 1 tablet by mouth daily as needed.   NARCAN 4 MG/0.1ML LIQD NASAL SPRAY KIT    Place 1 spray into the nose once.   OMEPRAZOLE (PRILOSEC) 20 MG CAPSULE    Take 20 mg by mouth in the morning and at  bedtime.    OXYCODONE-ACETAMINOPHEN (PERCOCET) 10-325 MG TABLET    Take 1 tablet by mouth every 6 (six) hours as needed for pain.   POTASSIUM CHLORIDE (KLOR-CON) 10 MEQ TABLET    Take 1 tablet (10 mEq total) by mouth daily.   PROPYLENE GLYCOL (SYSTANE COMPLETE) 0.6 % SOLN    Place 1 drop into both eyes daily as needed (dry eyes).   SENNA (SENOKOT) 8.6 MG TABS TABLET    Take 2 tablets by mouth daily before lunch.   SULFAMETHOXAZOLE-TRIMETHOPRIM (BACTRIM DS) 800-160 MG TABLET    Take 1 tablet by mouth 2 (two) times daily.   VITAMIN D, ERGOCALCIFEROL, (DRISDOL) 1.25 MG (50000 UNIT) CAPS CAPSULE    Take 50,000 Units by mouth once a week.   ZOLPIDEM (AMBIEN) 10 MG TABLET    Take 10 mg by mouth at bedtime as needed for sleep.  Modified Medications   No medications on file  Discontinued Medications   No medications on file    Subjective: 62 year old female with past medical history as below presents for hospital follow-up of postop lumbar wound.  [facility] - [facility]/26 for arthrodesis complicated by wound infection.Marland Kitchen.  History of MSSA hardware associated lumbar wound infection for which she completed suppressive Keflex in May 2021.  She was admitted for L1-L2 arthrodesis complicated by wound drainage requiring I&D cultures growing Enterobacter cloacae.  On 6/16 she underwent T10-L 3 posterior lateral arthrodesis.  She developed profuse drainage from wound.  Tamer 2 OR on 6/24 for thoracic wound exploration and repair of CSF leak.  She fever on 6/30.  MRI on 7/6 showed fluid collection tracking along left posterior spinal cord from lower T-spine to L4.  Collection crosses midline and lumbar laminectomy space L1-L2 communicated with superficial fluid.  Taken back to the OR on 7/3 for lumbar debridement, noted pus through the length and depth of incision.  Or cultures grew Enterobacter.  She was discharged on cefepime x8 weeks from I&D EOT 8/27.  Patient noted that she had been antibiotics  till 9/2. Plan was to place on suppressive antibiotics following completion's of IV therapy.  She does not recall her allergy to Bactrim.  Today 9/8: Patient reports feeling well.  She reports her wound VAC is out.  Denies fevers and chills.  She follows with Dr. Franky Machoabbell. 07/19/22: She thinks bactrim maybe making her firbromylia worse, and giving her "brain fog." She reports the  symptoms started 2 weeks into starting bactrim. She contnues to take it twice a day. She reports her symptoms are tolerable.   Today 01/16/23: Wound has healed.  Reports adherence to antibiotics. Review of Systems: Review of Systems  All other systems reviewed and are negative.   Past Medical History:  Diagnosis Date   Anemia    Ankle syndesmosis disruption    Anxiety    takes Ativan and Valium, after mother passed   Arthritis    bilateral knees s/p knee replacement bilaterally   Asthma    Bronchitis    Bruising    pt states unexplained d/t fibromyalgia   Chronic back pain    2012 tailbone surgery and 3 lower discs.    Closed fracture of distal end of right fibula and tibia    Depression    from Fibromyalgia diagnosis; not taking medicine. since 2001   Dizziness    rarely   Fibromyalgia    diagnosed 2001   GERD (gastroesophageal reflux disease)    Prilosec occasionally   Headache(784.0)    "sinus headaches"   History of hiatal hernia    Hypertension    since 2013   Hyperthyroidism    subclinical, no treatment; thyroid nodules   IBS (irritable bowel syndrome)    Impaired memory    states from fibromyalgia   Insomnia    takes Ambien   Jones fracture    left foot fifth metatarsal   Multiple allergies    including latex, pet dander, shellfish, pet dander   Painful orthopaedic hardware right ankle 05/27/2016   Seasonal allergies    Shortness of breath    Occasional with exertion;    Sore gums    this is why pt is on Amoxil-only takes for dental work   Tachycardia    Thyroid goiter    Varicose  vein    protrudes above skin-per pt;vein popped and bruised;ultrasound done to make sure that there were no clots;noclots were found    Social History   Tobacco Use   Smoking status: Former    Packs/day: 0.50    Years: 10.00    Additional pack years: 0.00    Total pack years: 5.00    Types: Cigarettes    Quit date: 12/06/1998    Years since quitting: 24.1   Smokeless tobacco: Never  Vaping Use   Vaping Use: Never used  Substance Use Topics   Alcohol use: No   Drug use: No    Family History  Problem Relation Age of Onset   Stroke Father        passed 2004 from pneumonia   Alcohol abuse Father    Pulmonary embolism Mother        after minor knee surgery leading to DVT   Arthritis Sister    Depression Sister    Hypertension Sister    Diabetes Other        grandmother   Anesthesia problems Neg Hx    Hypotension Neg Hx    Malignant hyperthermia Neg Hx    Pseudochol deficiency Neg Hx     Allergies  Allergen Reactions   Monosodium Glutamate Anaphylaxis and Swelling    Eyes swollen shut, facial swelling, tongue swelling.   Shellfish Allergy Anaphylaxis   Baclofen Nausea Only    Dizziness and increase muscle spasm   Celebrex [Celecoxib] Itching    Only allergic to generic brand   Contrast Media [Iodinated Contrast Media] Itching and Nausea Only    "could not  walk"   Diclofenac Itching    Generic Diclefenac gel causes itching. Can take the name brand Voltaren gel   Molds & Smuts Other (See Comments)    Causes allergies to flair up   Other Other (See Comments)    Pet dander,   Green rubber piece to hold mouth open (oval shape)     Sulfa Antibiotics Other (See Comments)    Unknown   Latex Itching and Rash    Latex glove with powder    Health Maintenance  Topic Date Due   COVID-19 Vaccine (1) Never done   Hepatitis C Screening  Never done   Zoster Vaccines- Shingrix (1 of 2) Never done   COLONOSCOPY (Pts 45-48yrs Insurance coverage will need to be confirmed)   Never done   PAP SMEAR-Modifier  12/12/2018   DTaP/Tdap/Td (2 - Tdap) 05/08/2020   INFLUENZA VACCINE  05/08/2023   Medicare Annual Wellness (AWV)  08/02/2023   MAMMOGRAM  12/22/2023   HIV Screening  Completed   HPV VACCINES  Aged Out    Objective:  Vitals:   01/16/23 1132  Resp: 16  Height: 5\' 2"  (1.575 m)   Body mass index is 39.87 kg/m.  Physical Exam Constitutional:      Appearance: Normal appearance.  HENT:     Head: Normocephalic and atraumatic.     Right Ear: Tympanic membrane normal.     Left Ear: Tympanic membrane normal.     Nose: Nose normal.     Mouth/Throat:     Mouth: Mucous membranes are moist.  Eyes:     Extraocular Movements: Extraocular movements intact.     Conjunctiva/sclera: Conjunctivae normal.     Pupils: Pupils are equal, round, and reactive to light.  Cardiovascular:     Rate and Rhythm: Normal rate and regular rhythm.     Heart sounds: No murmur heard.    No friction rub. No gallop.  Pulmonary:     Effort: Pulmonary effort is normal.     Breath sounds: Normal breath sounds.  Abdominal:     General: Abdomen is flat.     Palpations: Abdomen is soft.  Skin:    General: Skin is warm and dry.  Neurological:     General: No focal deficit present.     Mental Status: She is alert and oriented to person, place, and time.  Psychiatric:        Mood and Affect: Mood normal.     Lab Results Lab Results  Component Value Date   WBC 10.2 07/19/2022   HGB 7.8 (L) 07/19/2022   HCT 27.0 (L) 07/19/2022   MCV 61.4 (L) 07/19/2022   PLT 479 (H) 07/19/2022    Lab Results  Component Value Date   CREATININE 0.92 07/19/2022   BUN 15 07/19/2022   NA 139 07/19/2022   K 4.7 07/19/2022   CL 104 07/19/2022   CO2 25 07/19/2022    Lab Results  Component Value Date   ALT 12 07/19/2022   AST 14 07/19/2022   ALKPHOS 192 (A) 03/22/2020   BILITOT 0.2 07/19/2022    Lab Results  Component Value Date   CHOL 196 12/30/2013   HDL 70 12/30/2013   LDLCALC  106 (H) 12/30/2013   TRIG 102 12/30/2013   CHOLHDL 2.8 12/30/2013   Lab Results  Component Value Date   LABRPR NON REAC 12/30/2013   No results found for: "HIV1RNAQUANT", "HIV1RNAVL", "CD4TABS"   Problem List Items Addressed This Visit   None  Assessment/Plan #Postop op lumbar wound with retained HWR with Cx+ Enterobacter cloacae #Hx of MSSA lumbar hardware infection 9/2 last day of antibiotics.             Antibiotics: Cefepime 7/6-9/2 Ceftriaxone 7/3-7/5 Vancomycin 6/30-7/3 -Seen by Dr. Mikal Plane on 10/5, next visit 11/2 -Does not recall being on Bactrim(sulfa allergy listed, pt does not think she has taken sulfa drugs).  Allergy was added on 5/13 while she was on Keflex.  She has side effect of hyperpigmentation.  I suspect that Bactrim allergy may have been a Keflex allergy. -completed 6 weeks of antibiotics with cefepime on 9/2->started bactrim for PO suppresion -On bactrim for chornic suppression. ESR and CRP  38/18 on 07/19/22 which is a slihgly elevated. As such will repeat labs -Reviewed Dr. Sueanne Margarita note from 12/02/2022.  Noted no  no leak at that time, wound has healed.  Does not appear any plans for surgery at this point.  Given a 58-month follow-up. Continues with PT  for back pain.  Plan: -Continue suppressive Bactrim. Wound appears healed, no drianage - Continue to follow with neurosurgery - Labs today - F/U in  6 months   #Urinary incontinence -States she has been wearing diapers due to incontinence since her back surgery -Refer to Urology  #Sleep disturbance #OSA -Reports she is not adherent with CPAP as such has disturbed sleep cycle.   Danelle Earthly, MD [facility] 01/16/2023, 11:36 AM

## 2023-01-21 ENCOUNTER — Other Ambulatory Visit: Payer: Self-pay | Admitting: Nurse Practitioner

## 2023-01-21 ENCOUNTER — Other Ambulatory Visit: Payer: 59

## 2023-01-21 DIAGNOSIS — Z0000 Encounter for general adult medical examination without abnormal findings: Secondary | ICD-10-CM

## 2023-01-23 ENCOUNTER — Other Ambulatory Visit: Payer: Self-pay

## 2023-01-23 ENCOUNTER — Other Ambulatory Visit: Payer: 59

## 2023-01-23 DIAGNOSIS — M5136 Other intervertebral disc degeneration, lumbar region: Secondary | ICD-10-CM | POA: Diagnosis not present

## 2023-01-23 DIAGNOSIS — M4620 Osteomyelitis of vertebra, site unspecified: Secondary | ICD-10-CM | POA: Diagnosis not present

## 2023-01-23 DIAGNOSIS — M4180 Other forms of scoliosis, site unspecified: Secondary | ICD-10-CM | POA: Diagnosis not present

## 2023-01-23 LAB — CBC WITH DIFFERENTIAL/PLATELET
Absolute Monocytes: 630 cells/uL (ref 200–950)
Eosinophils Relative: 4 %
Neutrophils Relative %: 57.8 %

## 2023-01-23 NOTE — Addendum Note (Signed)
Addended by: Linna Hoff D on: 01/23/2023 08:29 AM   Modules accepted: Orders

## 2023-01-24 LAB — CBC WITH DIFFERENTIAL/PLATELET
Basophils Absolute: 54 cells/uL (ref 0–200)
Basophils Relative: 0.6 %
Eosinophils Absolute: 360 cells/uL (ref 15–500)
HCT: 29.1 % — ABNORMAL LOW (ref 35.0–45.0)
Hemoglobin: 8.2 g/dL — ABNORMAL LOW (ref 11.7–15.5)
Lymphs Abs: 2754 cells/uL (ref 850–3900)
MCH: 17.6 pg — ABNORMAL LOW (ref 27.0–33.0)
MCHC: 28.2 g/dL — ABNORMAL LOW (ref 32.0–36.0)
MCV: 62.3 fL — ABNORMAL LOW (ref 80.0–100.0)
MPV: 9.6 fL (ref 7.5–12.5)
Monocytes Relative: 7 %
Neutro Abs: 5202 cells/uL (ref 1500–7800)
Platelets: 500 10*3/uL — ABNORMAL HIGH (ref 140–400)
RBC: 4.67 10*6/uL (ref 3.80–5.10)
RDW: 21.9 % — ABNORMAL HIGH (ref 11.0–15.0)
Total Lymphocyte: 30.6 %
WBC: 9 10*3/uL (ref 3.8–10.8)

## 2023-01-24 LAB — C-REACTIVE PROTEIN: CRP: 3.2 mg/L (ref <8.0)

## 2023-01-24 LAB — COMPLETE METABOLIC PANEL WITH GFR
AG Ratio: 1.7 (calc) (ref 1.0–2.5)
ALT: 11 U/L (ref 6–29)
AST: 14 U/L (ref 10–35)
Albumin: 4.1 g/dL (ref 3.6–5.1)
Alkaline phosphatase (APISO): 155 U/L — ABNORMAL HIGH (ref 37–153)
BUN: 13 mg/dL (ref 7–25)
CO2: 27 mmol/L (ref 20–32)
Calcium: 9.3 mg/dL (ref 8.6–10.4)
Chloride: 106 mmol/L (ref 98–110)
Creat: 0.82 mg/dL (ref 0.50–1.05)
Globulin: 2.4 g/dL (calc) (ref 1.9–3.7)
Glucose, Bld: 77 mg/dL (ref 65–99)
Potassium: 5.8 mmol/L — ABNORMAL HIGH (ref 3.5–5.3)
Sodium: 140 mmol/L (ref 135–146)
Total Bilirubin: 0.2 mg/dL (ref 0.2–1.2)
Total Protein: 6.5 g/dL (ref 6.1–8.1)
eGFR: 82 mL/min/{1.73_m2} (ref >=60)

## 2023-01-24 LAB — SEDIMENTATION RATE: Sed Rate: 25 mm/h (ref 0–30)

## 2023-01-24 LAB — CBC MORPHOLOGY

## 2023-01-31 ENCOUNTER — Other Ambulatory Visit: Payer: Self-pay | Admitting: Neurosurgery

## 2023-01-31 DIAGNOSIS — M4180 Other forms of scoliosis, site unspecified: Secondary | ICD-10-CM

## 2023-02-10 DIAGNOSIS — F32A Depression, unspecified: Secondary | ICD-10-CM | POA: Diagnosis not present

## 2023-02-10 DIAGNOSIS — M62838 Other muscle spasm: Secondary | ICD-10-CM | POA: Diagnosis not present

## 2023-02-10 DIAGNOSIS — M25562 Pain in left knee: Secondary | ICD-10-CM | POA: Diagnosis not present

## 2023-02-10 DIAGNOSIS — M129 Arthropathy, unspecified: Secondary | ICD-10-CM | POA: Diagnosis not present

## 2023-02-10 DIAGNOSIS — M797 Fibromyalgia: Secondary | ICD-10-CM | POA: Diagnosis not present

## 2023-02-10 DIAGNOSIS — R7303 Prediabetes: Secondary | ICD-10-CM | POA: Diagnosis not present

## 2023-02-10 DIAGNOSIS — E559 Vitamin D deficiency, unspecified: Secondary | ICD-10-CM | POA: Diagnosis not present

## 2023-02-10 DIAGNOSIS — Z9181 History of falling: Secondary | ICD-10-CM | POA: Diagnosis not present

## 2023-02-10 DIAGNOSIS — M545 Low back pain, unspecified: Secondary | ICD-10-CM | POA: Diagnosis not present

## 2023-02-10 DIAGNOSIS — Z79899 Other long term (current) drug therapy: Secondary | ICD-10-CM | POA: Diagnosis not present

## 2023-02-10 DIAGNOSIS — G8929 Other chronic pain: Secondary | ICD-10-CM | POA: Diagnosis not present

## 2023-02-10 DIAGNOSIS — M25561 Pain in right knee: Secondary | ICD-10-CM | POA: Diagnosis not present

## 2023-02-12 DIAGNOSIS — Z79899 Other long term (current) drug therapy: Secondary | ICD-10-CM | POA: Diagnosis not present

## 2023-02-24 ENCOUNTER — Other Ambulatory Visit: Payer: Self-pay | Admitting: Family

## 2023-02-24 NOTE — Telephone Encounter (Signed)
Do you want her to continue bactrim and if so can you remove the sulfa allergy?

## 2023-02-27 ENCOUNTER — Ambulatory Visit: Payer: 59

## 2023-03-04 ENCOUNTER — Ambulatory Visit
Admission: RE | Admit: 2023-03-04 | Discharge: 2023-03-04 | Disposition: A | Discharge status: Home / Self Care | Payer: 59 | Source: Ambulatory Visit | Attending: Nurse Practitioner | Admitting: Nurse Practitioner

## 2023-03-04 DIAGNOSIS — Z0000 Encounter for general adult medical examination without abnormal findings: Secondary | ICD-10-CM

## 2023-03-04 DIAGNOSIS — D649 Anemia, unspecified: Secondary | ICD-10-CM | POA: Diagnosis not present

## 2023-03-04 DIAGNOSIS — Z1231 Encounter for screening mammogram for malignant neoplasm of breast: Secondary | ICD-10-CM | POA: Diagnosis not present

## 2023-03-04 DIAGNOSIS — R748 Abnormal levels of other serum enzymes: Secondary | ICD-10-CM | POA: Diagnosis not present

## 2023-03-11 ENCOUNTER — Ambulatory Visit
Admission: RE | Admit: 2023-03-11 | Discharge: 2023-03-11 | Disposition: A | Discharge status: Home / Self Care | Payer: 59 | Source: Ambulatory Visit | Attending: Neurosurgery | Admitting: Neurosurgery

## 2023-03-11 DIAGNOSIS — M4180 Other forms of scoliosis, site unspecified: Secondary | ICD-10-CM

## 2023-03-11 DIAGNOSIS — Z79899 Other long term (current) drug therapy: Secondary | ICD-10-CM | POA: Diagnosis not present

## 2023-03-11 DIAGNOSIS — M62838 Other muscle spasm: Secondary | ICD-10-CM | POA: Diagnosis not present

## 2023-03-11 DIAGNOSIS — M545 Low back pain, unspecified: Secondary | ICD-10-CM | POA: Diagnosis not present

## 2023-03-11 DIAGNOSIS — G8929 Other chronic pain: Secondary | ICD-10-CM | POA: Diagnosis not present

## 2023-03-11 DIAGNOSIS — M25561 Pain in right knee: Secondary | ICD-10-CM | POA: Diagnosis not present

## 2023-03-11 DIAGNOSIS — Z981 Arthrodesis status: Secondary | ICD-10-CM | POA: Diagnosis not present

## 2023-03-11 DIAGNOSIS — M47814 Spondylosis without myelopathy or radiculopathy, thoracic region: Secondary | ICD-10-CM | POA: Diagnosis not present

## 2023-03-11 DIAGNOSIS — M797 Fibromyalgia: Secondary | ICD-10-CM | POA: Diagnosis not present

## 2023-03-11 DIAGNOSIS — M25562 Pain in left knee: Secondary | ICD-10-CM | POA: Diagnosis not present

## 2023-03-12 ENCOUNTER — Other Ambulatory Visit: Payer: Self-pay

## 2023-03-12 MED ORDER — SULFAMETHOXAZOLE-TRIMETHOPRIM 800-160 MG PO TABS: 1.0000 | ORAL_TABLET | Freq: Two times a day (BID) | ORAL | 5 refills | Status: DC

## 2023-03-13 DIAGNOSIS — Z79899 Other long term (current) drug therapy: Secondary | ICD-10-CM | POA: Diagnosis not present

## 2023-03-18 ENCOUNTER — Other Ambulatory Visit: Payer: Self-pay | Admitting: Neurosurgery

## 2023-03-18 DIAGNOSIS — M4180 Other forms of scoliosis, site unspecified: Secondary | ICD-10-CM

## 2023-04-07 DIAGNOSIS — M62838 Other muscle spasm: Secondary | ICD-10-CM | POA: Diagnosis not present

## 2023-04-07 DIAGNOSIS — G4700 Insomnia, unspecified: Secondary | ICD-10-CM | POA: Diagnosis not present

## 2023-04-07 DIAGNOSIS — F32A Depression, unspecified: Secondary | ICD-10-CM | POA: Diagnosis not present

## 2023-04-07 DIAGNOSIS — R7303 Prediabetes: Secondary | ICD-10-CM | POA: Diagnosis not present

## 2023-04-07 DIAGNOSIS — M797 Fibromyalgia: Secondary | ICD-10-CM | POA: Diagnosis not present

## 2023-04-07 DIAGNOSIS — Z79899 Other long term (current) drug therapy: Secondary | ICD-10-CM | POA: Diagnosis not present

## 2023-04-07 DIAGNOSIS — Z9181 History of falling: Secondary | ICD-10-CM | POA: Diagnosis not present

## 2023-04-07 DIAGNOSIS — M25561 Pain in right knee: Secondary | ICD-10-CM | POA: Diagnosis not present

## 2023-04-07 DIAGNOSIS — M25562 Pain in left knee: Secondary | ICD-10-CM | POA: Diagnosis not present

## 2023-04-07 DIAGNOSIS — G8929 Other chronic pain: Secondary | ICD-10-CM | POA: Diagnosis not present

## 2023-04-07 DIAGNOSIS — M545 Low back pain, unspecified: Secondary | ICD-10-CM | POA: Diagnosis not present

## 2023-04-09 DIAGNOSIS — Z79899 Other long term (current) drug therapy: Secondary | ICD-10-CM | POA: Diagnosis not present

## 2023-04-14 DIAGNOSIS — D509 Iron deficiency anemia, unspecified: Secondary | ICD-10-CM | POA: Diagnosis not present

## 2023-04-14 DIAGNOSIS — H6090 Unspecified otitis externa, unspecified ear: Secondary | ICD-10-CM | POA: Diagnosis not present

## 2023-05-06 DIAGNOSIS — M25562 Pain in left knee: Secondary | ICD-10-CM | POA: Diagnosis not present

## 2023-05-06 DIAGNOSIS — Z79899 Other long term (current) drug therapy: Secondary | ICD-10-CM | POA: Diagnosis not present

## 2023-05-06 DIAGNOSIS — Z9181 History of falling: Secondary | ICD-10-CM | POA: Diagnosis not present

## 2023-05-06 DIAGNOSIS — R7303 Prediabetes: Secondary | ICD-10-CM | POA: Diagnosis not present

## 2023-05-06 DIAGNOSIS — M62838 Other muscle spasm: Secondary | ICD-10-CM | POA: Diagnosis not present

## 2023-05-06 DIAGNOSIS — M25561 Pain in right knee: Secondary | ICD-10-CM | POA: Diagnosis not present

## 2023-05-06 DIAGNOSIS — M545 Low back pain, unspecified: Secondary | ICD-10-CM | POA: Diagnosis not present

## 2023-05-06 DIAGNOSIS — G8929 Other chronic pain: Secondary | ICD-10-CM | POA: Diagnosis not present

## 2023-05-06 DIAGNOSIS — F32A Depression, unspecified: Secondary | ICD-10-CM | POA: Diagnosis not present

## 2023-05-06 DIAGNOSIS — M797 Fibromyalgia: Secondary | ICD-10-CM | POA: Diagnosis not present

## 2023-05-07 DIAGNOSIS — M4326 Fusion of spine, lumbar region: Secondary | ICD-10-CM | POA: Diagnosis not present

## 2023-05-08 DIAGNOSIS — Z79899 Other long term (current) drug therapy: Secondary | ICD-10-CM | POA: Diagnosis not present

## 2023-06-03 DIAGNOSIS — Z79899 Other long term (current) drug therapy: Secondary | ICD-10-CM | POA: Diagnosis not present

## 2023-06-03 DIAGNOSIS — G8929 Other chronic pain: Secondary | ICD-10-CM | POA: Diagnosis not present

## 2023-06-03 DIAGNOSIS — M545 Low back pain, unspecified: Secondary | ICD-10-CM | POA: Diagnosis not present

## 2023-06-03 DIAGNOSIS — M797 Fibromyalgia: Secondary | ICD-10-CM | POA: Diagnosis not present

## 2023-06-03 DIAGNOSIS — R7303 Prediabetes: Secondary | ICD-10-CM | POA: Diagnosis not present

## 2023-06-03 DIAGNOSIS — F32A Depression, unspecified: Secondary | ICD-10-CM | POA: Diagnosis not present

## 2023-06-03 DIAGNOSIS — M62838 Other muscle spasm: Secondary | ICD-10-CM | POA: Diagnosis not present

## 2023-06-03 DIAGNOSIS — M25561 Pain in right knee: Secondary | ICD-10-CM | POA: Diagnosis not present

## 2023-06-03 DIAGNOSIS — Z9181 History of falling: Secondary | ICD-10-CM | POA: Diagnosis not present

## 2023-06-03 DIAGNOSIS — M25562 Pain in left knee: Secondary | ICD-10-CM | POA: Diagnosis not present

## 2023-06-05 DIAGNOSIS — Z79899 Other long term (current) drug therapy: Secondary | ICD-10-CM | POA: Diagnosis not present

## 2023-07-02 DIAGNOSIS — Z9181 History of falling: Secondary | ICD-10-CM | POA: Diagnosis not present

## 2023-07-02 DIAGNOSIS — Z79899 Other long term (current) drug therapy: Secondary | ICD-10-CM | POA: Diagnosis not present

## 2023-07-02 DIAGNOSIS — R7303 Prediabetes: Secondary | ICD-10-CM | POA: Diagnosis not present

## 2023-07-02 DIAGNOSIS — M25561 Pain in right knee: Secondary | ICD-10-CM | POA: Diagnosis not present

## 2023-07-02 DIAGNOSIS — M25562 Pain in left knee: Secondary | ICD-10-CM | POA: Diagnosis not present

## 2023-07-02 DIAGNOSIS — Z23 Encounter for immunization: Secondary | ICD-10-CM | POA: Diagnosis not present

## 2023-07-02 DIAGNOSIS — M545 Low back pain, unspecified: Secondary | ICD-10-CM | POA: Diagnosis not present

## 2023-07-02 DIAGNOSIS — M62838 Other muscle spasm: Secondary | ICD-10-CM | POA: Diagnosis not present

## 2023-07-02 DIAGNOSIS — M797 Fibromyalgia: Secondary | ICD-10-CM | POA: Diagnosis not present

## 2023-07-04 DIAGNOSIS — Z79899 Other long term (current) drug therapy: Secondary | ICD-10-CM | POA: Diagnosis not present

## 2023-07-22 ENCOUNTER — Telehealth: Payer: Self-pay

## 2023-07-22 ENCOUNTER — Ambulatory Visit: Payer: 59 | Admitting: Internal Medicine

## 2023-07-22 NOTE — Telephone Encounter (Signed)
Attempted to contact via phone VM full no show 07/22/2023 Dr Thedore Mins. Sent mychart msg.

## 2023-07-30 DIAGNOSIS — M797 Fibromyalgia: Secondary | ICD-10-CM | POA: Diagnosis not present

## 2023-07-30 DIAGNOSIS — Z9181 History of falling: Secondary | ICD-10-CM | POA: Diagnosis not present

## 2023-07-30 DIAGNOSIS — M25562 Pain in left knee: Secondary | ICD-10-CM | POA: Diagnosis not present

## 2023-07-30 DIAGNOSIS — M62838 Other muscle spasm: Secondary | ICD-10-CM | POA: Diagnosis not present

## 2023-07-30 DIAGNOSIS — G8929 Other chronic pain: Secondary | ICD-10-CM | POA: Diagnosis not present

## 2023-07-30 DIAGNOSIS — M545 Low back pain, unspecified: Secondary | ICD-10-CM | POA: Diagnosis not present

## 2023-07-30 DIAGNOSIS — Z79899 Other long term (current) drug therapy: Secondary | ICD-10-CM | POA: Diagnosis not present

## 2023-07-30 DIAGNOSIS — M25561 Pain in right knee: Secondary | ICD-10-CM | POA: Diagnosis not present

## 2023-08-01 DIAGNOSIS — Z79899 Other long term (current) drug therapy: Secondary | ICD-10-CM | POA: Diagnosis not present

## 2023-08-14 ENCOUNTER — Other Ambulatory Visit: Payer: Self-pay | Admitting: Internal Medicine

## 2023-08-14 DIAGNOSIS — M4620 Osteomyelitis of vertebra, site unspecified: Secondary | ICD-10-CM

## 2023-08-19 ENCOUNTER — Telehealth: Payer: Self-pay

## 2023-08-19 NOTE — Telephone Encounter (Signed)
Received phone call from patient's son, Despina Hick. He was inquiring as to why patient is on Bactrim, and whether this can cause any side effects, or has risk of allergic reactions. He reports that she is not having specific signs of allergic reaction, but does have chronic pain in postoperative lumbar area, swollen feet, and occasional pain in other areas. Assured him that these would be unlikely to be related to Bactrim therapy. 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 Reassured him that she does not have any drug interactions. He did express some concerns regarding oversedation due to her pain medications. Instructed him to follow up with her surgeon, as they are likely to be handling this. He expressed understanding, and will follow up with them.

## 2023-08-29 DIAGNOSIS — Z0189 Encounter for other specified special examinations: Secondary | ICD-10-CM | POA: Diagnosis not present

## 2023-08-29 DIAGNOSIS — M62838 Other muscle spasm: Secondary | ICD-10-CM | POA: Diagnosis not present

## 2023-08-29 DIAGNOSIS — R7303 Prediabetes: Secondary | ICD-10-CM | POA: Diagnosis not present

## 2023-08-29 DIAGNOSIS — M129 Arthropathy, unspecified: Secondary | ICD-10-CM | POA: Diagnosis not present

## 2023-08-29 DIAGNOSIS — M545 Low back pain, unspecified: Secondary | ICD-10-CM | POA: Diagnosis not present

## 2023-08-29 DIAGNOSIS — M25561 Pain in right knee: Secondary | ICD-10-CM | POA: Diagnosis not present

## 2023-08-29 DIAGNOSIS — Z9181 History of falling: Secondary | ICD-10-CM | POA: Diagnosis not present

## 2023-08-29 DIAGNOSIS — M25562 Pain in left knee: Secondary | ICD-10-CM | POA: Diagnosis not present

## 2023-08-29 DIAGNOSIS — G8929 Other chronic pain: Secondary | ICD-10-CM | POA: Diagnosis not present

## 2023-08-29 DIAGNOSIS — Z79899 Other long term (current) drug therapy: Secondary | ICD-10-CM | POA: Diagnosis not present

## 2023-08-29 DIAGNOSIS — E559 Vitamin D deficiency, unspecified: Secondary | ICD-10-CM | POA: Diagnosis not present

## 2023-08-29 DIAGNOSIS — M797 Fibromyalgia: Secondary | ICD-10-CM | POA: Diagnosis not present

## 2023-09-01 DIAGNOSIS — Z79899 Other long term (current) drug therapy: Secondary | ICD-10-CM | POA: Diagnosis not present

## 2023-09-09 DIAGNOSIS — M4180 Other forms of scoliosis, site unspecified: Secondary | ICD-10-CM | POA: Diagnosis not present

## 2023-09-09 DIAGNOSIS — M4150 Other secondary scoliosis, site unspecified: Secondary | ICD-10-CM | POA: Diagnosis not present

## 2023-09-09 DIAGNOSIS — M48062 Spinal stenosis, lumbar region with neurogenic claudication: Secondary | ICD-10-CM | POA: Diagnosis not present

## 2023-09-12 DIAGNOSIS — R350 Frequency of micturition: Secondary | ICD-10-CM | POA: Diagnosis not present

## 2023-09-12 DIAGNOSIS — R3914 Feeling of incomplete bladder emptying: Secondary | ICD-10-CM | POA: Diagnosis not present

## 2023-09-22 ENCOUNTER — Ambulatory Visit: Payer: 59 | Admitting: Internal Medicine

## 2023-09-23 ENCOUNTER — Ambulatory Visit (INDEPENDENT_AMBULATORY_CARE_PROVIDER_SITE_OTHER): Payer: 59 | Admitting: Internal Medicine

## 2023-09-23 ENCOUNTER — Other Ambulatory Visit: Payer: Self-pay

## 2023-09-23 ENCOUNTER — Encounter: Payer: Self-pay | Admitting: Internal Medicine

## 2023-09-23 VITALS — BP 127/81 | HR 75 | Temp 97.9°F | Ht 62.0 in | Wt 224.0 lb

## 2023-09-23 DIAGNOSIS — M4620 Osteomyelitis of vertebra, site unspecified: Secondary | ICD-10-CM

## 2023-09-23 MED ORDER — SULFAMETHOXAZOLE-TRIMETHOPRIM 800-160 MG PO TABS
1.0000 | ORAL_TABLET | Freq: Two times a day (BID) | ORAL | 5 refills | Status: DC
Start: 2023-09-23 — End: 2023-10-28

## 2023-09-23 NOTE — Patient Instructions (Signed)
-  SPINAL HARDWARE INFECTION: We will monitor for signs of infection recurrence, such as worsening back pain, fevers, or chills, and repeat labs in one month to check for infection off of bactrim.   -DERMATOLOGIC CONCERN: The rash on your legs and elbow appears to be eczema, a condition that causes the skin to become itchy, red, and inflamed. Although you suspect it might be related to Bactrim, this is unlikely. Please continue using the prescribed cream from Dr. Lewie Chamber and monitor the rash for any changes or worsening.  INSTRUCTIONS:  Please monitor for any signs of infection recurrence, such as worsening back pain, fevers, or chills. We will repeat your labs in one month to check for infection. Continue using the prescribed cream for your rash and let us know if there are any changes or worsening.

## 2023-09-23 NOTE — Progress Notes (Signed)
Patient Active Problem List   Diagnosis Date Noted   Wound infection after surgery 05/14/2022   Delayed surgical wound healing 05/12/2022   Postoperative wound infection 04/08/2022   Scoliosis due to degenerative disease of spine in adult patient 03/30/2022   HNP (herniated nucleus pulposus), cervical 12/08/2020   Lumbago with sciatica, unspecified side 07/28/2020   PICC (peripherally inserted central catheter) in place 03/16/2020   Encounter for long-term (current) use of antibiotics 03/16/2020   Polypharmacy 03/09/2020   At risk for adverse drug event 03/09/2020   MSSA bacteremia 02/29/2020   Postoperative infection 02/28/2020   Abnormal gait due to muscle weakness 02/26/2020   HNP (herniated nucleus pulposus), lumbar 02/16/2020   Muscle weakness of lower extremity 02/14/2020   Spondylolisthesis of lumbar region 10/30/2017   Painful orthopaedic hardware right ankle 05/27/2016   Closed fracture of distal end of right fibula and tibia    Ankle syndesmosis disruption    ASCUS favor benign 01/06/2014   Leg swelling 12/22/2013   Seasonal allergies    Anemia    Chronic back pain due to DJD with history surgery    Hyperthyroidism    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 03/24/2012   Status post left total prosthetic replacement of knee joint using cement 03/24/2012   Fibromyalgia    GERD (gastroesophageal reflux disease)    Anxiety    Depression    Hypertension    Lumbar degenerative disc disease 09/25/2011    Patient's Medications  New Prescriptions   No medications on file  Previous Medications   ACETAMINOPHEN (TYLENOL) 500 MG TABLET    Take 1,000 mg by mouth every 8 (eight) hours as needed for moderate pain.   ALBUTEROL (VENTOLIN HFA) 108 (90 BASE) MCG/ACT INHALER    Inhale 2 puffs into the lungs every 6 (six) hours as needed for wheezing or shortness of breath.   ASCORBIC ACID (VITAMIN C) 500 MG TABLET    Take 500 mg by mouth daily.    ASPIRIN EC 81 MG TABLET    Take 81 mg by mouth daily.   ATENOLOL (TENORMIN) 50 MG TABLET    Take 1 tablet (50 mg total) by mouth daily.   CELEBREX 200 MG CAPSULE    Take 1 capsule (200 mg total) by mouth 2 (two) times daily.   CHLORZOXAZONE (PARAFON) 500 MG TABLET    Take 1 tablet (500 mg total) by mouth 3 (three) times daily.   CYANOCOBALAMIN (VITAMIN B12) 1000 MCG TABLET    Take 1,000 mcg by mouth once a week.   CYCLOBENZAPRINE (FLEXERIL) 10 MG TABLET    Take 10 mg by mouth 3 (three) times daily.   DIAZEPAM (VALIUM) 5 MG TABLET    Take 1 tablet (5 mg total) by mouth 2 (two) times daily.   DICLOFENAC (FLECTOR) 1.3 % PTCH    Place 1 patch onto the skin 2 (two) times daily as needed (pain.).   DICLOFENAC SODIUM (VOLTAREN) 1 % GEL    Apply 2 g topically 4 (four) times daily as needed (pain).   DIETHYLPROPION HCL CR 75 MG TB24    Take 75 mg by mouth daily.   DIPHENHYDRAMINE (BENADRYL) 25 MG TABLET    Take 50 mg by mouth in the morning, at noon, in the evening, and at bedtime.   FUROSEMIDE (LASIX) 20 MG TABLET    Take 2 tablets (40 mg total) by mouth daily.   GABAPENTIN (NEURONTIN) 600  MG TABLET    Take 2 tablets (1,200 mg total) by mouth 3 (three) times daily.   GUAIFENESIN (MUCINEX) 600 MG 12 HR TABLET    Take 600 mg by mouth 2 (two) times daily as needed for cough.   HYDROCHLOROTHIAZIDE (HYDRODIURIL) 25 MG TABLET    Take 1 tablet (25 mg total) by mouth daily. One tablet by mouth in the morning.   HYDROXYZINE (ATARAX/VISTARIL) 50 MG TABLET    Take 50 mg by mouth 2 (two) times daily.   LUBIPROSTONE (AMITIZA) 24 MCG CAPSULE    Take 1 capsule (24 mcg total) by mouth 2 (two) times daily with a meal.   METHOCARBAMOL (ROBAXIN) 750 MG TABLET    Take 750 mg by mouth 4 (four) times daily.   MULTIPLE VITAMIN (MULTIVITAMIN WITH MINERALS) TABS TABLET    Take 1 tablet by mouth daily as needed.   NARCAN 4 MG/0.1ML LIQD NASAL SPRAY KIT    Place 1 spray into the nose once.   OMEPRAZOLE (PRILOSEC) 20 MG CAPSULE     Take 20 mg by mouth in the morning and at bedtime.    OXYCODONE-ACETAMINOPHEN (PERCOCET) 10-325 MG TABLET    Take 1 tablet by mouth every 6 (six) hours as needed for pain.   POTASSIUM CHLORIDE (KLOR-CON) 10 MEQ TABLET    Take 1 tablet (10 mEq total) by mouth daily.   PROPYLENE GLYCOL (SYSTANE COMPLETE) 0.6 % SOLN    Place 1 drop into both eyes daily as needed (dry eyes).   SENNA (SENOKOT) 8.6 MG TABS TABLET    Take 2 tablets by mouth daily before lunch.   VITAMIN D, ERGOCALCIFEROL, (DRISDOL) 1.25 MG (50000 UNIT) CAPS CAPSULE    Take 50,000 Units by mouth once a week.   ZOLPIDEM (AMBIEN) 10 MG TABLET    Take 10 mg by mouth at bedtime as needed for sleep.  Modified Medications   Modified Medication Previous Medication   SULFAMETHOXAZOLE-TRIMETHOPRIM (BACTRIM DS) 800-160 MG TABLET sulfamethoxazole-trimethoprim (BACTRIM DS) 800-160 MG tablet      Take 1 tablet by mouth 2 (two) times daily.    Take 1 tablet by mouth 2 (two) times daily.  Discontinued Medications   No medications on file    Subjective:  62 year old female with past medical history as below presents for hospital follow-up of postop lumbar wound.  She was admitted to [facility] [place] 6/16 - 7/26 for arthrodesis complicated by wound infection.Marland Kitchen  History of MSSA hardware associated lumbar wound infection for which she completed suppressive Keflex in May 2021.  She was admitted for L1-L2 arthrodesis complicated by wound drainage requiring I&D cultures growing Enterobacter cloacae.  On 6/16 she underwent T10-L 3 posterior lateral arthrodesis.  She developed profuse drainage from wound.  Tamer 2 OR on 6/24 for thoracic wound exploration and repair of CSF leak.  She fever on 6/30.  MRI on 7/6 showed fluid collection tracking along left posterior spinal cord from lower T-spine to L4.  Collection crosses midline and lumbar laminectomy space L1-L2 communicated with superficial fluid.  Taken back to the OR on 7/3 for lumbar debridement, noted pus through  the length and depth of incision.  Or cultures grew Enterobacter.  She was discharged on cefepime x8 weeks from I&D EOT 8/27.  Patient noted that she had been antibiotics till 9/2. Plan was to place on suppressive antibiotics following completion's of IV therapy.  She does not recall her allergy to Bactrim.   Today 9/8: Patient reports feeling well.  She reports  her wound VAC is out.  Denies fevers and chills.  She follows with Dr. Franky Macho. 07/19/22: She thinks bactrim maybe making her firbromylia worse, and giving her "brain fog." She reports the symptoms started 2 weeks into starting bactrim. She contnues to take it twice a day. She reports her symptoms are tolerable.   Today 01/16/23: Wound has healed.  Reports adherence to antibiotics. Today 12/17 : Discussed the use of AI scribe software for clinical note transcription with the patient, who gave verbal consent to proceed.  History of Present Illness   Pt  reports 'real bad headaches' and a rash on the legs and under the arms for the past few months. She notes she would like ot stop the bactrim.     Review of Systems: Review of Systems  All other systems reviewed and are negative.   Past Medical History:  Diagnosis Date   Anemia    Ankle syndesmosis disruption    Anxiety    takes Ativan and Valium, after mother passed   Arthritis    bilateral knees s/p knee replacement bilaterally   Asthma    Bronchitis    Bruising    pt states unexplained d/t fibromyalgia   Chronic back pain    2012 tailbone surgery and 3 lower discs.    Closed fracture of distal end of right fibula and tibia    Depression    from Fibromyalgia diagnosis; not taking medicine. since 2001   Dizziness    rarely   Fibromyalgia    diagnosed 2001   GERD (gastroesophageal reflux disease)    Prilosec occasionally   Headache(784.0)    "sinus headaches"   History of hiatal hernia    Hypertension    since 2013   Hyperthyroidism    subclinical, no treatment;  thyroid nodules   IBS (irritable bowel syndrome)    Impaired memory    states from fibromyalgia   Insomnia    takes Ambien   Jones fracture    left foot fifth metatarsal   Multiple allergies    including latex, pet dander, shellfish, pet dander   Painful orthopaedic hardware right ankle 05/27/2016   Seasonal allergies    Shortness of breath    Occasional with exertion;    Sore gums    this is why pt is on Amoxil-only takes for dental work   Tachycardia    Thyroid goiter    Varicose vein    protrudes above skin-per pt;vein popped and bruised;ultrasound done to make sure that there were no clots;noclots were found    Social History   Tobacco Use   Smoking status: Former    Current packs/day: 0.00    Average packs/day: 0.5 packs/day for 10.0 years (5.0 ttl pk-yrs)    Types: Cigarettes    Start date: 12/05/1988    Quit date: 12/06/1998    Years since quitting: 24.8   Smokeless tobacco: Never  Vaping Use   Vaping status: Never Used  Substance Use Topics   Alcohol use: No   Drug use: No    Family History  Problem Relation Age of Onset   Stroke Father        passed 2004 from pneumonia   Alcohol abuse Father    Pulmonary embolism Mother        after minor knee surgery leading to DVT   Arthritis Sister    Depression Sister    Hypertension Sister    Diabetes Other        grandmother  Anesthesia problems Neg Hx    Hypotension Neg Hx    Malignant hyperthermia Neg Hx    Pseudochol deficiency Neg Hx     Allergies  Allergen Reactions   Monosodium Glutamate Anaphylaxis and Swelling    Eyes swollen shut, facial swelling, tongue swelling.   Shellfish Allergy Anaphylaxis   Baclofen Nausea Only    Dizziness and increase muscle spasm   Celebrex [Celecoxib] Itching    Only allergic to generic brand   Contrast Media [Iodinated Contrast Media] Itching and Nausea Only    "could not walk"   Diclofenac Itching    Generic Diclefenac gel causes itching. Can take the name brand  Voltaren gel   Other Other (See Comments)    Pet dander,   Green rubber piece to hold mouth open (oval shape)     Latex Itching and Rash    Latex glove with powder    Health Maintenance  Topic Date Due   COVID-19 Vaccine (1) Never done   Hepatitis C Screening  Never done   Zoster Vaccines- Shingrix (1 of 2) Never done   Colonoscopy  Never done   Cervical Cancer Screening (HPV/Pap Cotest)  12/12/2018   DTaP/Tdap/Td (2 - Tdap) 05/08/2020   INFLUENZA VACCINE  05/08/2023   Medicare Annual Wellness (AWV)  12/12/2023   MAMMOGRAM  03/03/2025   HIV Screening  Completed   HPV VACCINES  Aged Out    Objective:  Vitals:   09/23/23 1320  BP: 127/81  Pulse: 75  Temp: 97.9 F (36.6 C)  TempSrc: Temporal  SpO2: 94%  Weight: 224 lb (101.6 kg)  Height: 5\' 2"  (1.575 m)   Body mass index is 40.97 kg/m.  Physical Exam Constitutional:      Appearance: Normal appearance.  HENT:     Head: Normocephalic and atraumatic.     Right Ear: Tympanic membrane normal.     Left Ear: Tympanic membrane normal.     Nose: Nose normal.     Mouth/Throat:     Mouth: Mucous membranes are moist.  Eyes:     Extraocular Movements: Extraocular movements intact.     Conjunctiva/sclera: Conjunctivae normal.     Pupils: Pupils are equal, round, and reactive to light.  Cardiovascular:     Rate and Rhythm: Normal rate and regular rhythm.     Heart sounds: No murmur heard.    No friction rub. No gallop.  Pulmonary:     Effort: Pulmonary effort is normal.     Breath sounds: Normal breath sounds.  Abdominal:     General: Abdomen is flat.     Palpations: Abdomen is soft.  Musculoskeletal:        General: Normal range of motion.  Skin:    General: Skin is warm and dry.  Neurological:     General: No focal deficit present.     Mental Status: She is alert and oriented to person, place, and time.  Psychiatric:        Mood and Affect: Mood normal.    Physical Exam   SKIN: Rash present behind both  legs and underarm area.   Lab Results Lab Results  Component Value Date   WBC 10.3 09/23/2023   HGB 11.7 09/23/2023   HCT 37.4 09/23/2023   MCV 82.0 09/23/2023   PLT 350 09/23/2023    Lab Results  Component Value Date   CREATININE 0.85 09/23/2023   BUN 18 09/23/2023   NA 140 09/23/2023   K 4.6 09/23/2023  CL 103 09/23/2023   CO2 30 09/23/2023    Lab Results  Component Value Date   ALT 14 09/23/2023   AST 14 09/23/2023   ALKPHOS 192 (A) 03/22/2020   BILITOT 0.2 09/23/2023    Lab Results  Component Value Date   CHOL 196 12/30/2013   HDL 70 12/30/2013   LDLCALC 106 (H) 12/30/2013   TRIG 102 12/30/2013   CHOLHDL 2.8 12/30/2013   Lab Results  Component Value Date   LABRPR NON REAC 12/30/2013   No results found for: "HIV1RNAQUANT", "HIV1RNAVL", "CD4TABS"   Problem List Items Addressed This Visit   None Visit Diagnoses       Vertebral abscess (HCC)    -  Primary   Relevant Medications   sulfamethoxazole-trimethoprim (BACTRIM DS) 800-160 MG tablet   Other Relevant Orders   CBC with Differential/Platelet (Completed)   COMPLETE METABOLIC PANEL WITH GFR (Completed)   Sed Rate (ESR) (Completed)   CRP (C-Reactive Protein) (Completed)      Results   LABS ESR: normal (08/2023) CRP: normal (08/2023)  PATHOLOGY OR cultures: Enterobacter      Assessment/Plan #Postop op lumbar wound with retained HW with Cx+ Enterobacter cloacae  -Completed 6 weeks of antibiotics with cefepime on 06/08/22->started bactrim for PO suppresion . She  wishes to discontinue Bactrim  due to rash(started a few months ago) and no longer wishes to be on abx. I counseled on my recc to  continue bactrim given HW involvement and noted rash seems more c/w eczema.  -ESR and CRP are currently normal. Plan: -Pt  plans to d/c bactrim -Monitor for signs of infection recurrence (worsening back pain, fevers, chills). -Repeat labs in 1 month to monitor for infection.  #Rash -Patient reports  long-term rash on legs and underarm, which appears to be eczema. Patient believes it may be related to Bactrim. Does not plan to see derm.  -uses a topical cream Rx by PCP -Monitor rash for any changes or worsening.   #Medication monitoring -labs today           Danelle Earthly, MD [facility] 09/29/2023, 3:53 AM   I have personally spent 42 minutes involved in face-to-face and non-face-to-face activities for this patient on the day of the visit. Professional time spent includes the following activities: Preparing to see the patient (review of tests), Obtaining and/or reviewing separately obtained history (admission/discharge record), Performing a medically appropriate examination and/or evaluation , Ordering medications/tests/procedures, referring and communicating with other health care professionals, Documenting clinical information in the EMR, Independently interpreting results (not separately reported), Communicating results to the patient/family/caregiver, Counseling and educating the patient/family/caregiver and Care coordination (not separately reported).

## 2023-09-24 LAB — COMPLETE METABOLIC PANEL WITH GFR
AG Ratio: 2 (calc) (ref 1.0–2.5)
ALT: 14 U/L (ref 6–29)
AST: 14 U/L (ref 10–35)
Albumin: 4.2 g/dL (ref 3.6–5.1)
Alkaline phosphatase (APISO): 138 U/L (ref 37–153)
BUN: 18 mg/dL (ref 7–25)
CO2: 30 mmol/L (ref 20–32)
Calcium: 9.5 mg/dL (ref 8.6–10.4)
Chloride: 103 mmol/L (ref 98–110)
Creat: 0.85 mg/dL (ref 0.50–1.05)
Globulin: 2.1 g/dL (ref 1.9–3.7)
Glucose, Bld: 91 mg/dL (ref 65–99)
Potassium: 4.6 mmol/L (ref 3.5–5.3)
Sodium: 140 mmol/L (ref 135–146)
Total Bilirubin: 0.2 mg/dL (ref 0.2–1.2)
Total Protein: 6.3 g/dL (ref 6.1–8.1)
eGFR: 78 mL/min/{1.73_m2} (ref >=60)

## 2023-09-24 LAB — SEDIMENTATION RATE: Sed Rate: 17 mm/h (ref 0–30)

## 2023-09-24 LAB — CBC WITH DIFFERENTIAL/PLATELET
Absolute Lymphocytes: 2215 {cells}/uL (ref 850–3900)
Absolute Monocytes: 752 {cells}/uL (ref 200–950)
Basophils Absolute: 52 {cells}/uL (ref 0–200)
Basophils Relative: 0.5 %
Eosinophils Absolute: 206 {cells}/uL (ref 15–500)
Eosinophils Relative: 2 %
HCT: 37.4 % (ref 35.0–45.0)
Hemoglobin: 11.7 g/dL (ref 11.7–15.5)
MCH: 25.7 pg — ABNORMAL LOW (ref 27.0–33.0)
MCHC: 31.3 g/dL — ABNORMAL LOW (ref 32.0–36.0)
MCV: 82 fL (ref 80.0–100.0)
MPV: 10.8 fL (ref 7.5–12.5)
Monocytes Relative: 7.3 %
Neutro Abs: 7076 {cells}/uL (ref 1500–7800)
Neutrophils Relative %: 68.7 %
Platelets: 350 10*3/uL (ref 140–400)
RBC: 4.56 10*6/uL (ref 3.80–5.10)
RDW: 14.8 % (ref 11.0–15.0)
Total Lymphocyte: 21.5 %
WBC: 10.3 10*3/uL (ref 3.8–10.8)

## 2023-09-24 LAB — C-REACTIVE PROTEIN: CRP: 25.4 mg/L — ABNORMAL HIGH (ref <8.0)

## 2023-09-26 DIAGNOSIS — M25562 Pain in left knee: Secondary | ICD-10-CM | POA: Diagnosis not present

## 2023-09-26 DIAGNOSIS — M545 Low back pain, unspecified: Secondary | ICD-10-CM | POA: Diagnosis not present

## 2023-09-26 DIAGNOSIS — M25561 Pain in right knee: Secondary | ICD-10-CM | POA: Diagnosis not present

## 2023-09-26 DIAGNOSIS — G8929 Other chronic pain: Secondary | ICD-10-CM | POA: Diagnosis not present

## 2023-09-26 DIAGNOSIS — Z9181 History of falling: Secondary | ICD-10-CM | POA: Diagnosis not present

## 2023-09-26 DIAGNOSIS — M797 Fibromyalgia: Secondary | ICD-10-CM | POA: Diagnosis not present

## 2023-09-26 DIAGNOSIS — Z79899 Other long term (current) drug therapy: Secondary | ICD-10-CM | POA: Diagnosis not present

## 2023-09-26 DIAGNOSIS — R7303 Prediabetes: Secondary | ICD-10-CM | POA: Diagnosis not present

## 2023-09-26 DIAGNOSIS — M62838 Other muscle spasm: Secondary | ICD-10-CM | POA: Diagnosis not present

## 2023-10-02 DIAGNOSIS — Z79899 Other long term (current) drug therapy: Secondary | ICD-10-CM | POA: Diagnosis not present

## 2023-10-03 DIAGNOSIS — E7800 Pure hypercholesterolemia, unspecified: Secondary | ICD-10-CM | POA: Diagnosis not present

## 2023-10-03 DIAGNOSIS — K219 Gastro-esophageal reflux disease without esophagitis: Secondary | ICD-10-CM | POA: Diagnosis not present

## 2023-10-03 DIAGNOSIS — D509 Iron deficiency anemia, unspecified: Secondary | ICD-10-CM | POA: Diagnosis not present

## 2023-10-03 DIAGNOSIS — I10 Essential (primary) hypertension: Secondary | ICD-10-CM | POA: Diagnosis not present

## 2023-10-03 DIAGNOSIS — E041 Nontoxic single thyroid nodule: Secondary | ICD-10-CM | POA: Diagnosis not present

## 2023-10-07 DIAGNOSIS — E041 Nontoxic single thyroid nodule: Secondary | ICD-10-CM | POA: Diagnosis not present

## 2023-10-07 DIAGNOSIS — I10 Essential (primary) hypertension: Secondary | ICD-10-CM | POA: Diagnosis not present

## 2023-10-07 DIAGNOSIS — I700 Atherosclerosis of aorta: Secondary | ICD-10-CM | POA: Diagnosis not present

## 2023-10-07 DIAGNOSIS — D509 Iron deficiency anemia, unspecified: Secondary | ICD-10-CM | POA: Diagnosis not present

## 2023-10-07 DIAGNOSIS — K219 Gastro-esophageal reflux disease without esophagitis: Secondary | ICD-10-CM | POA: Diagnosis not present

## 2023-10-07 DIAGNOSIS — M542 Cervicalgia: Secondary | ICD-10-CM | POA: Diagnosis not present

## 2023-10-07 DIAGNOSIS — K582 Mixed irritable bowel syndrome: Secondary | ICD-10-CM | POA: Diagnosis not present

## 2023-10-07 DIAGNOSIS — Z0000 Encounter for general adult medical examination without abnormal findings: Secondary | ICD-10-CM | POA: Diagnosis not present

## 2023-10-07 DIAGNOSIS — M797 Fibromyalgia: Secondary | ICD-10-CM | POA: Diagnosis not present

## 2023-10-07 DIAGNOSIS — J45909 Unspecified asthma, uncomplicated: Secondary | ICD-10-CM | POA: Diagnosis not present

## 2023-10-07 DIAGNOSIS — E7800 Pure hypercholesterolemia, unspecified: Secondary | ICD-10-CM | POA: Diagnosis not present

## 2023-10-16 ENCOUNTER — Emergency Department (HOSPITAL_COMMUNITY)
Admission: EM | Admit: 2023-10-16 | Discharge: 2023-10-17 | Disposition: A | Discharge status: Home / Self Care | Payer: 59 | Attending: Emergency Medicine | Admitting: Emergency Medicine

## 2023-10-16 ENCOUNTER — Emergency Department (HOSPITAL_COMMUNITY): Payer: 59

## 2023-10-16 ENCOUNTER — Encounter (HOSPITAL_COMMUNITY): Payer: Self-pay

## 2023-10-16 DIAGNOSIS — M545 Low back pain, unspecified: Secondary | ICD-10-CM | POA: Insufficient documentation

## 2023-10-16 DIAGNOSIS — Z91040 Latex allergy status: Secondary | ICD-10-CM | POA: Insufficient documentation

## 2023-10-16 DIAGNOSIS — Z7982 Long term (current) use of aspirin: Secondary | ICD-10-CM | POA: Diagnosis not present

## 2023-10-16 DIAGNOSIS — I10 Essential (primary) hypertension: Secondary | ICD-10-CM | POA: Diagnosis not present

## 2023-10-16 DIAGNOSIS — J45909 Unspecified asthma, uncomplicated: Secondary | ICD-10-CM | POA: Insufficient documentation

## 2023-10-16 DIAGNOSIS — D72829 Elevated white blood cell count, unspecified: Secondary | ICD-10-CM | POA: Diagnosis not present

## 2023-10-16 DIAGNOSIS — Z79899 Other long term (current) drug therapy: Secondary | ICD-10-CM | POA: Insufficient documentation

## 2023-10-16 LAB — CBC WITH DIFFERENTIAL/PLATELET
Abs Immature Granulocytes: 0.04 10*3/uL (ref 0.00–0.07)
Basophils Absolute: 0.1 10*3/uL (ref 0.0–0.1)
Basophils Relative: 0 %
Eosinophils Absolute: 0.3 10*3/uL (ref 0.0–0.5)
Eosinophils Relative: 2 %
HCT: 43.5 % (ref 36.0–46.0)
Hemoglobin: 13.2 g/dL (ref 12.0–15.0)
Immature Granulocytes: 0 %
Lymphocytes Relative: 12 %
Lymphs Abs: 1.9 10*3/uL (ref 0.7–4.0)
MCH: 26.8 pg (ref 26.0–34.0)
MCHC: 30.3 g/dL (ref 30.0–36.0)
MCV: 88.2 fL (ref 80.0–100.0)
Monocytes Absolute: 1.2 10*3/uL — ABNORMAL HIGH (ref 0.1–1.0)
Monocytes Relative: 8 %
Neutro Abs: 12.2 10*3/uL — ABNORMAL HIGH (ref 1.7–7.7)
Neutrophils Relative %: 78 %
Platelets: 393 10*3/uL (ref 150–400)
RBC: 4.93 MIL/uL (ref 3.87–5.11)
RDW: 14 % (ref 11.5–15.5)
WBC: 15.7 10*3/uL — ABNORMAL HIGH (ref 4.0–10.5)
nRBC: 0 % (ref 0.0–0.2)

## 2023-10-16 LAB — URINALYSIS, ROUTINE W REFLEX MICROSCOPIC
Bilirubin Urine: NEGATIVE
Glucose, UA: NEGATIVE mg/dL
Hgb urine dipstick: NEGATIVE
Ketones, ur: NEGATIVE mg/dL
Leukocytes,Ua: NEGATIVE
Nitrite: NEGATIVE
Protein, ur: NEGATIVE mg/dL
Specific Gravity, Urine: 1.018 (ref 1.005–1.030)
pH: 6 (ref 5.0–8.0)

## 2023-10-16 LAB — BASIC METABOLIC PANEL
Anion gap: 14 (ref 5–15)
BUN: 14 mg/dL (ref 8–23)
CO2: 26 mmol/L (ref 22–32)
Calcium: 9.7 mg/dL (ref 8.9–10.3)
Chloride: 101 mmol/L (ref 98–111)
Creatinine, Ser: 0.63 mg/dL (ref 0.44–1.00)
GFR, Estimated: >60 mL/min (ref >60)
Glucose, Bld: 109 mg/dL — ABNORMAL HIGH (ref 70–99)
Potassium: 4.1 mmol/L (ref 3.5–5.1)
Sodium: 141 mmol/L (ref 135–145)

## 2023-10-16 LAB — CBC
HCT: 43.4 % (ref 36.0–46.0)
Hemoglobin: 13.1 g/dL (ref 12.0–15.0)
MCH: 26.4 pg (ref 26.0–34.0)
MCHC: 30.2 g/dL (ref 30.0–36.0)
MCV: 87.5 fL (ref 80.0–100.0)
Platelets: 369 10*3/uL (ref 150–400)
RBC: 4.96 MIL/uL (ref 3.87–5.11)
RDW: 14 % (ref 11.5–15.5)
WBC: 15.5 10*3/uL — ABNORMAL HIGH (ref 4.0–10.5)
nRBC: 0 % (ref 0.0–0.2)

## 2023-10-16 NOTE — ED Notes (Signed)
 Pt returned from ct/mri

## 2023-10-16 NOTE — ED Provider Notes (Signed)
  EMERGENCY DEPARTMENT AT [facility] Provider Note   CSN: [id number] Arrival date & time: 10/16/23  1647     History  Chief Complaint  Patient presents with   Back Pain    [name] is a 63 y.o. female.  63 year old female presents today for concern of low back pain, and weakness in bilateral lower extremities.  She has history of chronic low back pain and follows with neurosurgery.  She also had history of spinal epidural abscess and follows with infectious disease.  She stopped her antibiotic about 3 to 4 weeks ago because of reaction to the antibiotic.  She states she spoke to Dr. Genette office who recommended she come to the emergency department for the weakness.  She denies any saddle anesthesia, bowel or bladder incontinence.  She denies fever.  She states at baseline she is able to ambulate using a cane, rollator, and occasionally without using anything.  She has not attempted to ambulate.  The history is provided by the patient. No language interpreter was used.       Home Medications Prior to Admission medications   Medication Sig Start Date End Date Taking? Authorizing Provider  acetaminophen  (TYLENOL ) 500 MG tablet Take 1,000 mg by mouth every 8 (eight) hours as needed for moderate pain.    [provider]  albuterol  (VENTOLIN  HFA) 108 (90 Base) MCG/ACT inhaler Inhale 2 puffs into the lungs every 6 (six) hours as needed for wheezing or shortness of breath. 03/24/20   Medina-Vargas, Monina C, NP  ascorbic acid  (VITAMIN C ) 500 MG tablet Take 500 mg by mouth daily.    [provider]  aspirin  EC 81 MG tablet Take 81 mg by mouth daily.    [provider]  atenolol  (TENORMIN ) 50 MG tablet Take 1 tablet (50 mg total) by mouth daily. 03/24/20   Medina-Vargas, Monina C, NP  CELEBREX  200 MG capsule Take 1 capsule (200 mg total) by mouth 2 (two) times daily. Patient taking differently: Take 200 mg by mouth 2 (two) times daily.  Brand only 03/24/20   Medina-Vargas, Monina C, NP  chlorzoxazone  (PARAFON ) 500 MG tablet Take 1 tablet (500 mg total) by mouth 3 (three) times daily. 03/24/20   Medina-Vargas, Monina C, NP  cyanocobalamin  (VITAMIN B12) 1000 MCG tablet Take 1,000 mcg by mouth once a week.    [provider]  cyclobenzaprine  (FLEXERIL ) 10 MG tablet Take 10 mg by mouth 3 (three) times daily.    [provider]  diazepam  (VALIUM ) 5 MG tablet Take 1 tablet (5 mg total) by mouth 2 (two) times daily. Patient taking differently: Take 5 mg by mouth See admin instructions. Take 5 mg at bedtime and additional 5 mg if needed for anxiety 03/22/20   Medina-Vargas, Monina C, NP  diclofenac  (FLECTOR) 1.3 % PTCH Place 1 patch onto the skin 2 (two) times daily as needed (pain.).    [provider]  diclofenac  Sodium (VOLTAREN ) 1 % GEL Apply 2 g topically 4 (four) times daily as needed (pain).    [provider]  Diethylpropion  HCl CR 75 MG TB24 Take 75 mg by mouth daily. 05/17/21   [provider]  diphenhydrAMINE  (BENADRYL ) 25 MG tablet Take 50 mg by mouth in the morning, at noon, in the evening, and at bedtime.    [provider]  furosemide  (LASIX ) 20 MG tablet Take 2 tablets (40 mg total) by mouth daily. Patient taking differently: Take 40 mg by mouth 3 (  three) times daily. 03/24/20   Medina-Vargas, Monina C, NP  gabapentin  (NEURONTIN ) 600 MG tablet Take 2 tablets (1,200 mg total) by mouth 3 (three) times daily. 03/24/20   Medina-Vargas, Monina C, NP  guaiFENesin  (MUCINEX ) 600 MG 12 hr tablet Take 600 mg by mouth 2 (two) times daily as needed for cough.    [provider]  hydrochlorothiazide  (HYDRODIURIL ) 25 MG tablet Take 1 tablet (25 mg total) by mouth daily. One tablet by mouth in the morning. Patient taking differently: Take 12.5 mg by mouth daily. 03/24/20   Medina-Vargas, Monina C, NP  hydrOXYzine  (ATARAX /VISTARIL ) 50 MG tablet Take 50 mg by mouth 2 (two) times daily.  06/23/20   [provider]  lubiprostone  (AMITIZA ) 24 MCG capsule Take 1 capsule (24 mcg total) by mouth 2 (two) times daily with a meal. Patient taking differently: Take 48 mcg by mouth daily before lunch. 03/24/20   Medina-Vargas, Monina C, NP  methocarbamol  (ROBAXIN ) 750 MG tablet Take 750 mg by mouth 4 (four) times daily.    [provider]  Multiple Vitamin (MULTIVITAMIN WITH MINERALS) TABS tablet Take 1 tablet by mouth daily as needed.    [provider]  NARCAN  4 MG/0.1ML LIQD nasal spray kit Place 1 spray into the nose once. 05/26/20   [provider]  omeprazole (PRILOSEC) 20 MG capsule Take 20 mg by mouth in the morning and at bedtime.     [provider]  oxyCODONE -acetaminophen  (PERCOCET) 10-325 MG tablet Take 1 tablet by mouth every 6 (six) hours as needed for pain. Patient taking differently: Take 1 tablet by mouth 5 (five) times daily. 03/22/20   Medina-Vargas, Monina C, NP  potassium chloride  (KLOR-CON ) 10 MEQ tablet Take 1 tablet (10 mEq total) by mouth daily. Patient taking differently: Take 20 mEq by mouth daily. 03/24/20   Medina-Vargas, Monina C, NP  Propylene Glycol (SYSTANE COMPLETE) 0.6 % SOLN Place 1 drop into both eyes daily as needed (dry eyes).    [provider]  senna (SENOKOT) 8.6 MG TABS tablet Take 2 tablets by mouth daily before lunch.    [provider]  sulfamethoxazole -trimethoprim  (BACTRIM  DS) 800-160 MG tablet Take 1 tablet by mouth 2 (two) times daily. 09/23/23   Dennise Kingsley, MD  Vitamin D , Ergocalciferol , (DRISDOL) 1.25 MG (50000 UNIT) CAPS capsule Take 50,000 Units by mouth once a week. 02/19/22   [provider]  zolpidem  (AMBIEN ) 10 MG tablet Take 10 mg by mouth at bedtime as needed for sleep.    [provider]      Allergies    Monosodium glutamate, Shellfish allergy, Baclofen, Celebrex  [celecoxib ], Contrast media [iodinated contrast media], Diclofenac , Other, and Latex     Review of Systems   Review of Systems  Constitutional:  Negative for chills and fever.  Gastrointestinal:  Negative for abdominal pain.  Genitourinary:  Negative for difficulty urinating, dysuria and frequency.  Musculoskeletal:  Positive for back pain.  All other systems reviewed and are negative.   Physical Exam Updated Vital Signs BP (!) 157/88 (BP Location: Right Arm)   Pulse (!) 101   Temp 97.8 F (36.6 C) (Oral)   Resp 20   LMP 11/20/2012   SpO2 94%  Physical Exam Vitals and nursing note reviewed.  Constitutional:      General: She is not in acute distress.    Appearance: Normal appearance. She is not ill-appearing.  HENT:     Head: Normocephalic and atraumatic.     Nose: Nose normal.  Eyes:     Conjunctiva/sclera: Conjunctivae normal.  Pulmonary:     Effort: Pulmonary effort is normal. No respiratory distress.  Musculoskeletal:        General: No deformity.     Comments: Limited range of motion in bilateral lower extremities.  Patient does not participate in exam.  Tenderness palpation over the lumbar spine.  Skin:    Findings: No rash.  Neurological:     Mental Status: She is alert.     ED Results / Procedures / Treatments   Labs (all labs ordered are listed, but only abnormal results are displayed) Labs Reviewed  CBC - Abnormal; Notable for the following components:      Result Value   WBC 15.5 (*)    All other components within normal limits  BASIC METABOLIC PANEL - Abnormal; Notable for the following components:   Glucose, Bld 109 (*)    All other components within normal limits  URINALYSIS, ROUTINE W REFLEX MICROSCOPIC  DIFFERENTIAL    EKG None  Radiology No results found.  Procedures Procedures    Medications Ordered in ED Medications - No data to display  ED Course/ Medical Decision Making/ A&P                                 Medical Decision Making Amount and/or Complexity of Data Reviewed Labs: ordered. Radiology:  ordered.   Medical Decision Making / ED Course   This patient presents to the ED for concern of low back pain, this involves an extensive number of treatment options, and is a complaint that carries with it a high risk of complications and morbidity.  The differential diagnosis includes chronic low back pain, muscle spasm, cauda equina syndrome, spinal epidural abscess, UTI  MDM: 63 year old female presents with low back pain, and weakness in bilateral lower extremities.  Overall well-appearing.  Mild tenderness over the lumbar spine. On history she does not have any symptoms that would raise suspicion for cauda equina syndrome or spinal epidural abscess. CBC does show leukocytosis of 15.7, with mild left shift.  BMP with glucose of 109 otherwise without acute concern.  MRI lumbar spine was ordered currently pending.  MRI pending at the end of my shift.  Signed out to Dr. Randol.    Additional history obtained: -Additional history obtained from ID clinic note -External records from outside source obtained and reviewed including: Chart review including previous notes, labs, imaging, consultation notes   Lab Tests: -I ordered, reviewed, and interpreted labs.   The pertinent results include:   Labs Reviewed  CBC - Abnormal; Notable for the following components:      Result Value   WBC 15.5 (*)    All other components within normal limits  BASIC METABOLIC PANEL - Abnormal; Notable for the following components:   Glucose, Bld 109 (*)    All other components within normal limits  URINALYSIS, ROUTINE W REFLEX MICROSCOPIC  CBC WITH DIFFERENTIAL/PLATELET      EKG  EKG Interpretation Date/Time:    Ventricular Rate:    PR Interval:    QRS Duration:    QT Interval:    QTC Calculation:   R Axis:      Text Interpretation:           Imaging Studies ordered: I ordered imaging studies including MRI ordered but not resulted at the end of my shift. I independently visualized and  interpreted imaging. I  agree with the radiologist interpretation   Medicines ordered and prescription drug management: No orders of the defined types were placed in this encounter.   -I have reviewed the patients home medicines and have made adjustments as needed  Reevaluation: After the interventions noted above, I reevaluated the patient and found that they have :stayed the same  Co morbidities that complicate the patient evaluation  Past Medical History:  Diagnosis Date   Anemia    Ankle syndesmosis disruption    Anxiety    takes Ativan  and Valium , after mother passed   Arthritis    bilateral knees s/p knee replacement bilaterally   Asthma    Bronchitis    Bruising    pt states unexplained d/t fibromyalgia   Chronic back pain    2012 tailbone surgery and 3 lower discs.    Closed fracture of distal end of right fibula and tibia    Depression    from Fibromyalgia diagnosis; not taking medicine. since 2001   Dizziness    rarely   Fibromyalgia    diagnosed 2001   GERD (gastroesophageal reflux disease)    Prilosec occasionally   Headache(784.0)    sinus headaches   History of hiatal hernia    Hypertension    since 2013   Hyperthyroidism    subclinical, no treatment; thyroid  nodules   IBS (irritable bowel syndrome)    Impaired memory    states from fibromyalgia   Insomnia    takes Ambien    Jones fracture    left foot fifth metatarsal   Multiple allergies    including latex, pet dander, shellfish, pet dander   Painful orthopaedic hardware right ankle 05/27/2016   Seasonal allergies    Shortness of breath    Occasional with exertion;    Sore gums    this is why pt is on Amoxil-only takes for dental work   Tachycardia    Thyroid  goiter    Varicose vein    protrudes above skin-per pt;vein popped and bruised;ultrasound done to make sure that there were no clots;noclots were found      Dispostion: Signout to Dr. Randol at the end of my shift.   Final  Clinical Impression(s) / ED Diagnoses Final diagnoses:  Midline low back pain, unspecified chronicity, unspecified whether sciatica present    Rx / DC Orders ED Discharge Orders     None         Hildegard Loge, DEVONNA 10/16/23 2118    Randol Simmonds, MD 10/16/23 2234

## 2023-10-16 NOTE — Discharge Instructions (Addendum)
 The MRI did not show any signs of acute infection or acute impingement on the spinal cord.  Continue your current medications.

## 2023-10-16 NOTE — ED Provider Triage Note (Signed)
 Emergency Medicine Provider Triage Evaluation Note  [name] , a 63 y.o. female  was evaluated in triage.  Pt complains of weakness in lower extremity.  Pt has history of prior spinal surgery and infection.  Pt had been on abx supression but stopped it recently due to possible reaction to the abx.  Pt states she had trouble lifting her feet today.  Pt spoke with Dr Rhoda office and was told to come to the hospital.  Pt is having pain in her lower back.  SHe also feels like her neuropathy pain in her head an neck are increasing.  Review of Systems  Positive: Back pain, weakness Negative: No fever  Physical Exam  BP (!) 157/88 (BP Location: Right Arm)   Pulse (!) 101   Temp 97.8 F (36.6 C) (Oral)   Resp 20   LMP 11/20/2012   SpO2 94%  Gen:   Awake, no distress   Resp:  Normal effort  MSK:   Difficulty moving lower extrem, ttp lower back Other:    Medical Decision Making  Medically screening exam initiated at 5:23 PM.  Appropriate orders placed.  Amaiah I Castanon was informed that the remainder of the evaluation will be completed by another provider, this initial triage assessment does not replace that evaluation, and the importance of remaining in the ED until their evaluation is complete.     Randol Simmonds, MD 10/16/23 [phone]

## 2023-10-16 NOTE — ED Provider Notes (Signed)
 MRI does not show any acute abnormality.  IMPRESSION: 1. No acute abnormality within the lumbar spine. 2. Prior posterior fusion extending from T11 through L5, with ALIF at L5-S1. 3. Degenerative spondylosis at L1-2 with resultant mild spinal stenosis, with mild bilateral L1 foraminal narrowing. 4. Degenerative disc bulging with facet hypertrophy at L2-3 with resultant mild right lateral recess stenosis, with suspected moderate bilateral L2 foraminal narrowing. 5. Mild to moderate right L3 foraminal stenosis related to disc bulge and facet hypertrophy.   Chronic findings noted without acute infection.  Evaluation and diagnostic testing in the emergency department does not suggest an emergent condition requiring admission or immediate intervention beyond what has been performed at this time.  The patient is safe for discharge and has been instructed to return immediately for worsening symptoms, change in symptoms or any other concerns.    Randol Simmonds, MD 10/16/23 2340
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
## 2023-10-17 NOTE — ED Notes (Signed)
 Discharge instructions reviewed with pt and family.   Opportunity for questions and concerns provided.   Encouraged to follow up with neurosurgery.

## 2023-10-21 ENCOUNTER — Ambulatory Visit: Payer: 59 | Admitting: Internal Medicine

## 2023-10-28 ENCOUNTER — Emergency Department (HOSPITAL_COMMUNITY): Payer: 59

## 2023-10-28 ENCOUNTER — Inpatient Hospital Stay (HOSPITAL_COMMUNITY)
Admission: EM | Admit: 2023-10-28 | Discharge: 2023-11-02 | DRG: 559 | Disposition: A | Discharge status: Home Health | Payer: 59 | Attending: Internal Medicine | Admitting: Internal Medicine

## 2023-10-28 ENCOUNTER — Other Ambulatory Visit: Payer: Self-pay

## 2023-10-28 ENCOUNTER — Encounter (HOSPITAL_COMMUNITY): Payer: Self-pay

## 2023-10-28 DIAGNOSIS — F419 Anxiety disorder, unspecified: Secondary | ICD-10-CM | POA: Diagnosis present

## 2023-10-28 DIAGNOSIS — Z8619 Personal history of other infectious and parasitic diseases: Secondary | ICD-10-CM

## 2023-10-28 DIAGNOSIS — Z91040 Latex allergy status: Secondary | ICD-10-CM | POA: Diagnosis not present

## 2023-10-28 DIAGNOSIS — Z96653 Presence of artificial knee joint, bilateral: Secondary | ICD-10-CM | POA: Diagnosis present

## 2023-10-28 DIAGNOSIS — R200 Anesthesia of skin: Secondary | ICD-10-CM | POA: Diagnosis not present

## 2023-10-28 DIAGNOSIS — Z882 Allergy status to sulfonamides status: Secondary | ICD-10-CM

## 2023-10-28 DIAGNOSIS — Y831 Surgical operation with implant of artificial internal device as the cause of abnormal reaction of the patient, or of later complication, without mention of misadventure at the time of the procedure: Secondary | ICD-10-CM | POA: Diagnosis present

## 2023-10-28 DIAGNOSIS — Z8249 Family history of ischemic heart disease and other diseases of the circulatory system: Secondary | ICD-10-CM | POA: Diagnosis not present

## 2023-10-28 DIAGNOSIS — Z8661 Personal history of infections of the central nervous system: Secondary | ICD-10-CM

## 2023-10-28 DIAGNOSIS — Z888 Allergy status to other drugs, medicaments and biological substances status: Secondary | ICD-10-CM

## 2023-10-28 DIAGNOSIS — E66813 Obesity, class 3: Secondary | ICD-10-CM | POA: Diagnosis present

## 2023-10-28 DIAGNOSIS — M797 Fibromyalgia: Secondary | ICD-10-CM | POA: Diagnosis present

## 2023-10-28 DIAGNOSIS — Z87891 Personal history of nicotine dependence: Secondary | ICD-10-CM

## 2023-10-28 DIAGNOSIS — Z881 Allergy status to other antibiotic agents status: Secondary | ICD-10-CM

## 2023-10-28 DIAGNOSIS — Z7982 Long term (current) use of aspirin: Secondary | ICD-10-CM

## 2023-10-28 DIAGNOSIS — Z91041 Radiographic dye allergy status: Secondary | ICD-10-CM | POA: Diagnosis not present

## 2023-10-28 DIAGNOSIS — R29898 Other symptoms and signs involving the musculoskeletal system: Secondary | ICD-10-CM

## 2023-10-28 DIAGNOSIS — Z91013 Allergy to seafood: Secondary | ICD-10-CM

## 2023-10-28 DIAGNOSIS — I10 Essential (primary) hypertension: Secondary | ICD-10-CM | POA: Diagnosis present

## 2023-10-28 DIAGNOSIS — Z6841 Body Mass Index (BMI) 40.0 and over, adult: Secondary | ICD-10-CM | POA: Diagnosis not present

## 2023-10-28 DIAGNOSIS — Z792 Long term (current) use of antibiotics: Secondary | ICD-10-CM

## 2023-10-28 DIAGNOSIS — G4700 Insomnia, unspecified: Secondary | ICD-10-CM | POA: Diagnosis present

## 2023-10-28 DIAGNOSIS — Z981 Arthrodesis status: Secondary | ICD-10-CM

## 2023-10-28 DIAGNOSIS — M4804 Spinal stenosis, thoracic region: Secondary | ICD-10-CM | POA: Diagnosis present

## 2023-10-28 DIAGNOSIS — M4644 Discitis, unspecified, thoracic region: Secondary | ICD-10-CM | POA: Diagnosis present

## 2023-10-28 DIAGNOSIS — M4640 Discitis, unspecified, site unspecified: Secondary | ICD-10-CM | POA: Diagnosis present

## 2023-10-28 DIAGNOSIS — Z9842 Cataract extraction status, left eye: Secondary | ICD-10-CM

## 2023-10-28 DIAGNOSIS — M4620 Osteomyelitis of vertebra, site unspecified: Principal | ICD-10-CM

## 2023-10-28 DIAGNOSIS — T8463XA Infection and inflammatory reaction due to internal fixation device of spine, initial encounter: Secondary | ICD-10-CM | POA: Diagnosis present

## 2023-10-28 DIAGNOSIS — G061 Intraspinal abscess and granuloma: Secondary | ICD-10-CM | POA: Diagnosis not present

## 2023-10-28 DIAGNOSIS — G9529 Other cord compression: Secondary | ICD-10-CM | POA: Diagnosis present

## 2023-10-28 DIAGNOSIS — R739 Hyperglycemia, unspecified: Secondary | ICD-10-CM | POA: Diagnosis not present

## 2023-10-28 DIAGNOSIS — Z823 Family history of stroke: Secondary | ICD-10-CM

## 2023-10-28 DIAGNOSIS — M4624 Osteomyelitis of vertebra, thoracic region: Secondary | ICD-10-CM | POA: Diagnosis present

## 2023-10-28 DIAGNOSIS — E871 Hypo-osmolality and hyponatremia: Secondary | ICD-10-CM | POA: Diagnosis present

## 2023-10-28 DIAGNOSIS — R531 Weakness: Secondary | ICD-10-CM | POA: Diagnosis not present

## 2023-10-28 DIAGNOSIS — Z833 Family history of diabetes mellitus: Secondary | ICD-10-CM

## 2023-10-28 DIAGNOSIS — Z8261 Family history of arthritis: Secondary | ICD-10-CM

## 2023-10-28 DIAGNOSIS — Z79899 Other long term (current) drug therapy: Secondary | ICD-10-CM

## 2023-10-28 DIAGNOSIS — E8809 Other disorders of plasma-protein metabolism, not elsewhere classified: Secondary | ICD-10-CM | POA: Diagnosis present

## 2023-10-28 DIAGNOSIS — Z9841 Cataract extraction status, right eye: Secondary | ICD-10-CM

## 2023-10-28 DIAGNOSIS — K219 Gastro-esophageal reflux disease without esophagitis: Secondary | ICD-10-CM | POA: Diagnosis present

## 2023-10-28 DIAGNOSIS — Z791 Long term (current) use of non-steroidal anti-inflammatories (NSAID): Secondary | ICD-10-CM

## 2023-10-28 LAB — CBC WITH DIFFERENTIAL/PLATELET
Abs Immature Granulocytes: 0.03 10*3/uL (ref 0.00–0.07)
Basophils Absolute: 0.1 10*3/uL (ref 0.0–0.1)
Basophils Relative: 1 %
Eosinophils Absolute: 0.3 10*3/uL (ref 0.0–0.5)
Eosinophils Relative: 3 %
HCT: 46.3 % — ABNORMAL HIGH (ref 36.0–46.0)
Hemoglobin: 13.8 g/dL (ref 12.0–15.0)
Immature Granulocytes: 0 %
Lymphocytes Relative: 30 %
Lymphs Abs: 2.4 10*3/uL (ref 0.7–4.0)
MCH: 26.3 pg (ref 26.0–34.0)
MCHC: 29.8 g/dL — ABNORMAL LOW (ref 30.0–36.0)
MCV: 88.4 fL (ref 80.0–100.0)
Monocytes Absolute: 0.5 10*3/uL (ref 0.1–1.0)
Monocytes Relative: 7 %
Neutro Abs: 4.8 10*3/uL (ref 1.7–7.7)
Neutrophils Relative %: 59 %
Platelets: 289 10*3/uL (ref 150–400)
RBC: 5.24 MIL/uL — ABNORMAL HIGH (ref 3.87–5.11)
RDW: 13 % (ref 11.5–15.5)
WBC: 8 10*3/uL (ref 4.0–10.5)
nRBC: 0 % (ref 0.0–0.2)

## 2023-10-28 LAB — C-REACTIVE PROTEIN: CRP: 0.5 mg/dL (ref <1.0)

## 2023-10-28 LAB — COMPREHENSIVE METABOLIC PANEL
ALT: 24 U/L (ref 0–44)
AST: 25 U/L (ref 15–41)
Albumin: 3.7 g/dL (ref 3.5–5.0)
Alkaline Phosphatase: 124 U/L (ref 38–126)
Anion gap: 8 (ref 5–15)
BUN: 14 mg/dL (ref 8–23)
CO2: 28 mmol/L (ref 22–32)
Calcium: 9.7 mg/dL (ref 8.9–10.3)
Chloride: 104 mmol/L (ref 98–111)
Creatinine, Ser: 0.64 mg/dL (ref 0.44–1.00)
GFR, Estimated: >60 mL/min (ref >60)
Glucose, Bld: 104 mg/dL — ABNORMAL HIGH (ref 70–99)
Potassium: 3.9 mmol/L (ref 3.5–5.1)
Sodium: 140 mmol/L (ref 135–145)
Total Bilirubin: 0.3 mg/dL (ref 0.0–1.2)
Total Protein: 6.3 g/dL — ABNORMAL LOW (ref 6.5–8.1)

## 2023-10-28 LAB — I-STAT CG4 LACTIC ACID, ED: Lactic Acid, Venous: 1 mmol/L (ref 0.5–1.9)

## 2023-10-28 LAB — SEDIMENTATION RATE: Sed Rate: 7 mm/h (ref 0–22)

## 2023-10-28 MED ORDER — PANTOPRAZOLE SODIUM 40 MG PO TBEC
40.0000 mg | DELAYED_RELEASE_TABLET | Freq: Every day | ORAL | Status: DC
  Administered 2023-10-29 – 2023-11-01 (×4): 40 mg via ORAL
  Filled 2023-10-28 (×4): qty 1

## 2023-10-28 MED ORDER — OXYCODONE HCL 5 MG PO TABS: 5.0000 mg | ORAL_TABLET | ORAL | Status: DC | PRN

## 2023-10-28 MED ORDER — DICLOFENAC SODIUM 1 % EX GEL
2.0000 g | Freq: Four times a day (QID) | CUTANEOUS | Status: DC | PRN
Start: 2023-10-28 — End: 2023-11-02

## 2023-10-28 MED ORDER — ALBUTEROL SULFATE (2.5 MG/3ML) 0.083% IN NEBU: 3.0000 mL | INHALATION_SOLUTION | Freq: Four times a day (QID) | RESPIRATORY_TRACT | Status: DC | PRN

## 2023-10-28 MED ORDER — CHLORZOXAZONE 500 MG PO TABS
500.0000 mg | ORAL_TABLET | Freq: Four times a day (QID) | ORAL | Status: DC
  Administered 2023-10-29 – 2023-11-01 (×14): 500 mg via ORAL
  Filled 2023-10-28 (×18): qty 1

## 2023-10-28 MED ORDER — CELECOXIB 200 MG PO CAPS
200.0000 mg | ORAL_CAPSULE | Freq: Two times a day (BID) | ORAL | Status: DC
  Administered 2023-10-29 – 2023-11-01 (×5): 200 mg via ORAL
  Filled 2023-10-28 (×8): qty 1

## 2023-10-28 MED ORDER — ATENOLOL 25 MG PO TABS
50.0000 mg | ORAL_TABLET | Freq: Every day | ORAL | Status: DC
  Administered 2023-10-29 – 2023-11-01 (×4): 50 mg via ORAL
  Filled 2023-10-28 (×4): qty 2

## 2023-10-28 MED ORDER — ACETAMINOPHEN 325 MG PO TABS: 650.0000 mg | ORAL_TABLET | Freq: Four times a day (QID) | ORAL | Status: DC | PRN

## 2023-10-28 MED ORDER — GABAPENTIN 300 MG PO CAPS
1200.0000 mg | ORAL_CAPSULE | Freq: Three times a day (TID) | ORAL | Status: DC
  Administered 2023-10-29 – 2023-11-01 (×12): 1200 mg via ORAL
  Filled 2023-10-28 (×12): qty 4

## 2023-10-28 MED ORDER — DIAZEPAM 5 MG PO TABS: 5.0000 mg | ORAL_TABLET | ORAL | Status: DC

## 2023-10-28 MED ORDER — ONDANSETRON HCL 4 MG/2ML IJ SOLN: 4.0000 mg | Freq: Four times a day (QID) | INTRAMUSCULAR | Status: DC | PRN

## 2023-10-28 MED ORDER — ADULT MULTIVITAMIN W/MINERALS CH
1.0000 | ORAL_TABLET | Freq: Every morning | ORAL | Status: DC
  Administered 2023-10-29 – 2023-11-01 (×4): 1 via ORAL
  Filled 2023-10-28 (×4): qty 1

## 2023-10-28 MED ORDER — LORAZEPAM 2 MG/ML IJ SOLN: 1.0000 mg | INTRAMUSCULAR | Status: DC

## 2023-10-28 MED ORDER — CYCLOBENZAPRINE HCL 5 MG PO TABS
10.0000 mg | ORAL_TABLET | Freq: Three times a day (TID) | ORAL | Status: DC
  Administered 2023-10-29 – 2023-11-01 (×12): 10 mg via ORAL
  Filled 2023-10-28 (×3): qty 2
  Filled 2023-10-28: qty 1
  Filled 2023-10-28 (×7): qty 2
  Filled 2023-10-28: qty 1

## 2023-10-28 MED ORDER — DIPHENHYDRAMINE HCL 25 MG PO CAPS
50.0000 mg | ORAL_CAPSULE | Freq: Three times a day (TID) | ORAL | Status: DC | PRN
  Administered 2023-10-29: 50 mg via ORAL
  Filled 2023-10-28: qty 2

## 2023-10-28 MED ORDER — ONDANSETRON HCL 4 MG PO TABS: 4.0000 mg | ORAL_TABLET | Freq: Four times a day (QID) | ORAL | Status: DC | PRN

## 2023-10-28 MED ORDER — OXYCODONE HCL 5 MG PO TABS
5.0000 mg | ORAL_TABLET | ORAL | Status: AC
  Administered 2023-10-28: 5 mg via ORAL
  Filled 2023-10-28: qty 1

## 2023-10-28 MED ORDER — METHOCARBAMOL 500 MG PO TABS
500.0000 mg | ORAL_TABLET | Freq: Four times a day (QID) | ORAL | Status: DC
  Administered 2023-10-29 – 2023-11-01 (×15): 500 mg via ORAL
  Filled 2023-10-28 (×15): qty 1

## 2023-10-28 MED ORDER — FERROUS SULFATE 325 (65 FE) MG PO TABS
325.0000 mg | ORAL_TABLET | Freq: Every morning | ORAL | Status: DC
  Administered 2023-10-29 – 2023-11-01 (×4): 325 mg via ORAL
  Filled 2023-10-28 (×4): qty 1

## 2023-10-28 MED ORDER — HYDROCHLOROTHIAZIDE 12.5 MG PO TABS
12.5000 mg | ORAL_TABLET | Freq: Every day | ORAL | Status: DC
  Administered 2023-10-29: 12.5 mg via ORAL
  Filled 2023-10-28: qty 1

## 2023-10-28 MED ORDER — ACETAMINOPHEN 650 MG RE SUPP: 650.0000 mg | Freq: Four times a day (QID) | RECTAL | Status: DC | PRN

## 2023-10-28 MED ORDER — HYDROXYZINE HCL 25 MG PO TABS: 50.0000 mg | ORAL_TABLET | Freq: Two times a day (BID) | ORAL | Status: DC | PRN

## 2023-10-28 MED ORDER — LUBIPROSTONE 24 MCG PO CAPS
48.0000 ug | ORAL_CAPSULE | Freq: Every day | ORAL | Status: DC
  Administered 2023-10-30 – 2023-11-01 (×3): 48 ug via ORAL
  Filled 2023-10-28 (×3): qty 2

## 2023-10-28 MED ORDER — OXYCODONE-ACETAMINOPHEN 5-325 MG PO TABS
2.0000 | ORAL_TABLET | Freq: Every day | ORAL | Status: DC | PRN
  Administered 2023-10-29 – 2023-10-31 (×7): 2 via ORAL
  Filled 2023-10-28 (×8): qty 2

## 2023-10-28 NOTE — ED Provider Notes (Signed)
[place] EMERGENCY DEPARTMENT AT [name] Provider Note   CSN: [id number] Arrival date & time: 10/28/23  1209     History {Add pertinent medical, surgical, social history, OB history to HPI:1} Chief Complaint  Patient presents with   Numbness    [name] is a 63 y.o. female.  63 year old female with a history of spinal epidural abscess (MSSA hardware infection) with multiple surgeries and revisions previously on outpatient antibiotics who presents to the emergency department with bilateral lower extremity weakness and numbness in all 4 extremities.  Patient reports that she discontinued her Bactrim in mid December.  Afterwards she reports that she started having numbness in her upper extremities.  Has been having longstanding bilateral lower extremity weakness as well where her legs will just give out.  Says her legs are also numb as well.  No new bowel or bladder incontinence.  Did have an MRI of the lumbar spine recently that did not show evidence of cord compression.       Home Medications Prior to Admission medications   Medication Sig Start Date End Date Taking? Authorizing Provider  acetaminophen (TYLENOL) 500 MG tablet Take 1,000 mg by mouth every 8 (eight) hours as needed for moderate pain.    [provider]  albuterol (VENTOLIN HFA) 108 (90 Base) MCG/ACT inhaler Inhale 2 puffs into the lungs every 6 (six) hours as needed for wheezing or shortness of breath. 03/24/20   Medina-Vargas, Monina C, NP  ascorbic acid (VITAMIN C) 500 MG tablet Take 500 mg by mouth daily.    [provider]  aspirin EC 81 MG tablet Take 81 mg by mouth daily.    [provider]  atenolol (TENORMIN) 50 MG tablet Take 1 tablet (50 mg total) by mouth daily. 03/24/20   Medina-Vargas, Monina C, NP  CELEBREX 200 MG capsule Take 1 capsule (200 mg total) by mouth 2 (two) times daily. Patient taking differently: Take 200 mg by mouth 2 (two) times daily. Brand only  03/24/20   Medina-Vargas, Monina C, NP  chlorzoxazone (PARAFON) 500 MG tablet Take 1 tablet (500 mg total) by mouth 3 (three) times daily. 03/24/20   Medina-Vargas, Monina C, NP  cyanocobalamin (VITAMIN B12) 1000 MCG tablet Take 1,000 mcg by mouth once a week.    [provider]  cyclobenzaprine (FLEXERIL) 10 MG tablet Take 10 mg by mouth 3 (three) times daily.    [provider]  diazepam (VALIUM) 5 MG tablet Take 1 tablet (5 mg total) by mouth 2 (two) times daily. Patient taking differently: Take 5 mg by mouth See admin instructions. Take 5 mg at bedtime and additional 5 mg if needed for anxiety 03/22/20   Medina-Vargas, Monina C, NP  diclofenac (FLECTOR) 1.3 % PTCH Place 1 patch onto the skin 2 (two) times daily as needed (pain.).    [provider]  diclofenac Sodium (VOLTAREN) 1 % GEL Apply 2 g topically 4 (four) times daily as needed (pain).    [provider]  Diethylpropion HCl CR 75 MG TB24 Take 75 mg by mouth daily. 05/17/21   [provider]  diphenhydrAMINE (BENADRYL) 25 MG tablet Take 50 mg by mouth in the morning, at noon, in the evening, and at bedtime.    [provider]  furosemide (LASIX) 20 MG tablet Take 2 tablets (40 mg total) by mouth daily. Patient taking differently: Take 40 mg by mouth 3 (three) times daily. 03/24/20   Medina-Vargas, Margit Banda, NP  gabapentin (NEURONTIN) 600 MG tablet Take 2 tablets (1,200 mg total) by mouth 3 (three) times daily. 03/24/20   Medina-Vargas, Monina C, NP  guaiFENesin (MUCINEX) 600 MG 12 hr tablet Take 600 mg by mouth 2 (two) times daily as needed for cough.    [provider]  hydrochlorothiazide (HYDRODIURIL) 25 MG tablet Take 1 tablet (25 mg total) by mouth daily. One tablet by mouth in the morning. Patient taking differently: Take 12.5 mg by mouth daily. 03/24/20   Medina-Vargas, Monina C, NP  hydrOXYzine (ATARAX/VISTARIL) 50 MG tablet Take 50 mg by mouth 2 (two) times daily. 06/23/20    [provider]  lubiprostone (AMITIZA) 24 MCG capsule Take 1 capsule (24 mcg total) by mouth 2 (two) times daily with a meal. Patient taking differently: Take 48 mcg by mouth daily before lunch. 03/24/20   Medina-Vargas, Monina C, NP  methocarbamol (ROBAXIN) 750 MG tablet Take 750 mg by mouth 4 (four) times daily.    [provider]  Multiple Vitamin (MULTIVITAMIN WITH MINERALS) TABS tablet Take 1 tablet by mouth daily as needed.    [provider]  NARCAN 4 MG/0.1ML LIQD nasal spray kit Place 1 spray into the nose once. 05/26/20   [provider]  omeprazole (PRILOSEC) 20 MG capsule Take 20 mg by mouth in the morning and at bedtime.     [provider]  oxyCODONE-acetaminophen (PERCOCET) 10-325 MG tablet Take 1 tablet by mouth every 6 (six) hours as needed for pain. Patient taking differently: Take 1 tablet by mouth 5 (five) times daily. 03/22/20   Medina-Vargas, Monina C, NP  potassium chloride (KLOR-CON) 10 MEQ tablet Take 1 tablet (10 mEq total) by mouth daily. Patient taking differently: Take 20 mEq by mouth daily. 03/24/20   Medina-Vargas, Monina C, NP  Propylene Glycol (SYSTANE COMPLETE) 0.6 % SOLN Place 1 drop into both eyes daily as needed (dry eyes).    [provider]  senna (SENOKOT) 8.6 MG TABS tablet Take 2 tablets by mouth daily before lunch.    [provider]  sulfamethoxazole-trimethoprim (BACTRIM DS) 800-160 MG tablet Take 1 tablet by mouth 2 (two) times daily. 09/23/23   Danelle Earthly, MD  Vitamin D, Ergocalciferol, (DRISDOL) 1.25 MG (50000 UNIT) CAPS capsule Take 50,000 Units by mouth once a week. 02/19/22   [provider]  zolpidem (AMBIEN) 10 MG tablet Take 10 mg by mouth at bedtime as needed for sleep.    [provider]      Allergies    Monosodium glutamate, Shellfish allergy, Baclofen, Celebrex [celecoxib], Contrast media [iodinated contrast media], Diclofenac, Other, and Latex    Review of  Systems   Review of Systems  Physical Exam Updated Vital Signs BP (!) 158/76   Pulse 67   Temp 97.7 F (36.5 C) (Oral)   Resp 16   LMP 11/20/2012   SpO2 96%  Physical Exam Vitals and nursing note reviewed.  Constitutional:      General: She is not in acute distress.    Appearance: She is well-developed.  HENT:     Head: Normocephalic and atraumatic.     Right Ear: External ear normal.     Left Ear: External ear normal.     Nose: Nose normal.  Eyes:     Extraocular Movements: Extraocular movements intact.     Conjunctiva/sclera: Conjunctivae normal.     Pupils: Pupils are equal, round, and reactive to light.  Pulmonary:     Effort: Pulmonary effort is normal.  No respiratory distress.  Musculoskeletal:     Cervical back: Normal range of motion and neck supple.     Right lower leg: No edema.     Left lower leg: No edema.  Skin:    General: Skin is warm and dry.  Neurological:     Mental Status: She is alert.     Comments: Tenderness palpation in the cervical spine at approximately C5.  T8 midline tenderness to palpation.  Patient tender along the entire lumbar spine. No stepoffs noted.   Motor: 4/5 in bilateral deltoids, biceps, triceps, and grip strength.  2/5 in bilateral hip flexors, knee extension, knee flexion, ankle dorsi and plantarflexion.  5/5 and great toe dorsiflexion.  Sensory: Intact sensation to light touch in C4-C8 distribution and L2 though S1 dermatomes bilaterally.   Reflexes: Achilles 2+ bilaterally, no ankle clonus bilaterally.  Patellar reflexes difficult to assess due to habitus.  Psychiatric:        Mood and Affect: Mood normal.     ED Results / Procedures / Treatments   Labs (all labs ordered are listed, but only abnormal results are displayed) Labs Reviewed  CBC WITH DIFFERENTIAL/PLATELET - Abnormal; Notable for the following components:      Result Value   RBC 5.24 (*)    HCT 46.3 (*)    MCHC 29.8 (*)    All other components within  normal limits  COMPREHENSIVE METABOLIC PANEL - Abnormal; Notable for the following components:   Glucose, Bld 104 (*)    Total Protein 6.3 (*)    All other components within normal limits  C-REACTIVE PROTEIN  SEDIMENTATION RATE  I-STAT CG4 LACTIC ACID, ED  I-STAT CG4 LACTIC ACID, ED    EKG None  Radiology No results found.  Procedures Procedures  {Document cardiac monitor, telemetry assessment procedure when appropriate:1}  Medications Ordered in ED Medications  LORazepam (ATIVAN) injection 1 mg (has no administration in time range)    ED Course/ Medical Decision Making/ A&P   {   Click here for ABCD2, HEART and other calculatorsREFRESH Note before signing :1}                              Medical Decision Making Amount and/or Complexity of Data Reviewed Radiology: ordered.  Risk Prescription drug management.   ***  {Document critical care time when appropriate:1} {Document review of labs and clinical decision tools ie heart score, Chads2Vasc2 etc:1}  {Document your independent review of radiology images, and any outside records:1} {Document your discussion with family members, caretakers, and with consultants:1} {Document social determinants of health affecting pt's care:1} {Document your decision making why or why not admission, treatments were needed:1} Final Clinical Impression(s) / ED Diagnoses Final diagnoses:  None    Rx / DC Orders ED Discharge Orders     None

## 2023-10-28 NOTE — ED Provider Triage Note (Signed)
Emergency Medicine Provider Triage Evaluation Note  [name] , a 63 y.o. female  was evaluated in triage.  Pt complains of weakness of her arm and legs with recurrent falls.  This has been progressive over the last 3 weeks.  She reports the weakness in her legs is pretty persistent but the weakness in her arms seems to come and go.  It has caused her to fall.  She has not been able to leave her house in the last 3 weeks unless she takes an ambulance.  She also has some headaches but denies any facial droop, weakness, difficulty swallowing or speaking.  She is not aware of having a fever but does report that she thinks her symptoms have worsened since she discontinued the Bactrim in December when she saw infectious disease.  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  She was seen in the emergency room 10 days ago and at that time was complaining of weakness and had an MRI that showed nonspecific findings and degenerative changes.  She did have a white count at that time of 15.  She was discharged home but symptoms of only worsened  Review of Systems  Positive: Weakness of the arms and legs, falls, headaches Negative: No fever, abdominal pain, no speech or vision changes  Physical Exam  BP 130/85 (BP Location: Right Arm)   Pulse 78   Temp 98.4 F (36.9 C) (Oral)   Resp 17   LMP 11/20/2012   SpO2 98%  Gen:   Awake, no distress   Resp:  Normal effort  MSK:   Barely able to wiggle the toes on bilateral legs.  Cannot lift her feet up off of the wheelchair.  Can lift her arms but 4 out of 5 strength bilaterally Other:  No abdominal pain.  No noted facial droop, aphasia  Medical Decision Making  Medically screening exam initiated at 12:35 PM.  Appropriate orders placed.  Bao I Rajan was informed that the remainder of  the evaluation will be completed by another provider, this initial triage assessment does not replace that evaluation, and the importance of remaining in the ED until their evaluation is complete.     Gwyneth Sprout, MD 10/28/23 [phone]

## 2023-10-28 NOTE — ED Notes (Signed)
Pt transferred from wheelchair to stretcher, required assist X3 d/t weakness of bilateral lower extremities.

## 2023-10-28 NOTE — ED Notes (Signed)
Pt to MRI

## 2023-10-28 NOTE — ED Notes (Addendum)
Pt reports allergy to MRI contrast. MD made aware. Also reports she does not need meds for claustrophobia.

## 2023-10-28 NOTE — ED Triage Notes (Signed)
Pt arrives via EMS from home. Pt reports ongoing intermittent numbness and weakness in all extremities. PT was recently seen for the same and had an MRI on the 9th. AxOx4.

## 2023-10-29 ENCOUNTER — Inpatient Hospital Stay (HOSPITAL_COMMUNITY): Payer: 59

## 2023-10-29 ENCOUNTER — Encounter (HOSPITAL_COMMUNITY): Payer: Self-pay | Admitting: Internal Medicine

## 2023-10-29 DIAGNOSIS — I10 Essential (primary) hypertension: Secondary | ICD-10-CM

## 2023-10-29 DIAGNOSIS — M4624 Osteomyelitis of vertebra, thoracic region: Secondary | ICD-10-CM | POA: Diagnosis not present

## 2023-10-29 DIAGNOSIS — R200 Anesthesia of skin: Secondary | ICD-10-CM | POA: Diagnosis not present

## 2023-10-29 DIAGNOSIS — M4644 Discitis, unspecified, thoracic region: Secondary | ICD-10-CM | POA: Diagnosis not present

## 2023-10-29 DIAGNOSIS — Z79899 Other long term (current) drug therapy: Secondary | ICD-10-CM | POA: Diagnosis not present

## 2023-10-29 DIAGNOSIS — R531 Weakness: Secondary | ICD-10-CM

## 2023-10-29 HISTORY — PX: IR THORACIC DISC ASPIRATION W/IMG GUIDE: IMG943

## 2023-10-29 LAB — BASIC METABOLIC PANEL
Anion gap: 13 (ref 5–15)
BUN: 15 mg/dL (ref 8–23)
CO2: 25 mmol/L (ref 22–32)
Calcium: 9.6 mg/dL (ref 8.9–10.3)
Chloride: 101 mmol/L (ref 98–111)
Creatinine, Ser: 0.58 mg/dL (ref 0.44–1.00)
GFR, Estimated: >60 mL/min (ref >60)
Glucose, Bld: 107 mg/dL — ABNORMAL HIGH (ref 70–99)
Potassium: 3.6 mmol/L (ref 3.5–5.1)
Sodium: 139 mmol/L (ref 135–145)

## 2023-10-29 LAB — CBC
HCT: 42.6 % (ref 36.0–46.0)
Hemoglobin: 12.9 g/dL (ref 12.0–15.0)
MCH: 26.1 pg (ref 26.0–34.0)
MCHC: 30.3 g/dL (ref 30.0–36.0)
MCV: 86.1 fL (ref 80.0–100.0)
Platelets: 334 10*3/uL (ref 150–400)
RBC: 4.95 MIL/uL (ref 3.87–5.11)
RDW: 12.9 % (ref 11.5–15.5)
WBC: 8.1 10*3/uL (ref 4.0–10.5)
nRBC: 0 % (ref 0.0–0.2)

## 2023-10-29 LAB — HIV ANTIBODY (ROUTINE TESTING W REFLEX): HIV Screen 4th Generation wRfx: NONREACTIVE

## 2023-10-29 MED ORDER — DIPHENHYDRAMINE HCL 25 MG PO CAPS
50.0000 mg | ORAL_CAPSULE | Freq: Four times a day (QID) | ORAL | Status: DC
  Administered 2023-10-29 – 2023-11-01 (×13): 50 mg via ORAL
  Filled 2023-10-29 (×13): qty 2

## 2023-10-29 MED ORDER — DIAZEPAM 5 MG PO TABS: 5.0000 mg | ORAL_TABLET | Freq: Every day | ORAL | Status: DC | PRN

## 2023-10-29 MED ORDER — DIAZEPAM 5 MG PO TABS
5.0000 mg | ORAL_TABLET | Freq: Every day | ORAL | Status: DC
  Administered 2023-10-29 – 2023-10-31 (×4): 5 mg via ORAL
  Filled 2023-10-29 (×4): qty 1

## 2023-10-29 MED ORDER — MIDAZOLAM HCL 2 MG/2ML IJ SOLN
INTRAMUSCULAR | Status: AC | PRN
  Administered 2023-10-29 (×2): 1 mg via INTRAVENOUS

## 2023-10-29 MED ORDER — ASPIRIN 81 MG PO TBEC
81.0000 mg | DELAYED_RELEASE_TABLET | Freq: Every morning | ORAL | Status: DC
  Administered 2023-10-29 – 2023-11-01 (×4): 81 mg via ORAL
  Filled 2023-10-29 (×4): qty 1

## 2023-10-29 MED ORDER — LIDOCAINE HCL 1 % IJ SOLN
20.0000 mL | Freq: Once | INTRAMUSCULAR | Status: AC
  Administered 2023-10-29: 10 mL

## 2023-10-29 MED ORDER — LIDOCAINE HCL (PF) 1 % IJ SOLN
INTRAMUSCULAR | Status: AC
  Filled 2023-10-29: qty 30

## 2023-10-29 MED ORDER — FENTANYL CITRATE (PF) 100 MCG/2ML IJ SOLN
INTRAMUSCULAR | Status: AC
  Filled 2023-10-29: qty 4

## 2023-10-29 MED ORDER — FENTANYL CITRATE (PF) 100 MCG/2ML IJ SOLN
INTRAMUSCULAR | Status: AC | PRN
  Administered 2023-10-29 (×2): 50 ug via INTRAVENOUS

## 2023-10-29 MED ORDER — SODIUM CHLORIDE 0.9 % IV SOLN
2.0000 g | Freq: Three times a day (TID) | INTRAVENOUS | Status: DC
  Administered 2023-10-29 – 2023-11-01 (×10): 2 g via INTRAVENOUS
  Filled 2023-10-29 (×10): qty 12.5

## 2023-10-29 MED ORDER — MIDAZOLAM HCL 2 MG/2ML IJ SOLN
INTRAMUSCULAR | Status: AC
Start: 2023-10-29 — End: ?
  Filled 2023-10-29: qty 4

## 2023-10-29 NOTE — Assessment & Plan Note (Signed)
Caution with her multiple meds, will continue home meds for the moment.

## 2023-10-29 NOTE — Consult Note (Signed)
Chief Complaint: Patient was seen in consultation today for numbness, discitis  Referring Physician(s): Dr. Pablo Ledger, NP  Supervising Physician: Baldemar Lenis  Patient Status: [facility] - In-pt  History of Present Illness: [name] is a 63 y.o. female with past medical history significant for multiple spine surgeries, chronic back pain, prior infections/infection of hardware who was previously on Bactrim long-term (~1.5 years) until recently stopped in Dev 2024 due to potential medication side effects. Now admitted with new back pain, possible discitis by MR imaging.  Interventional Neuroradiology consulted for T9 disc aspiration.  Case reviewed and approved by Dr. Tommie Sams.   Patient assessed at bedside.  She has been NPO.  She is aware of plans for sampling and is agreeable to proceed. Son also aware by telephone during consult.   Past Medical History:  Diagnosis Date   Anemia    Ankle syndesmosis disruption    Anxiety    takes Ativan and Valium, after mother passed   Arthritis    bilateral knees s/p knee replacement bilaterally   Asthma    Bronchitis    Bruising    pt states unexplained d/t fibromyalgia   Chronic back pain    2012 tailbone surgery and 3 lower discs.    Closed fracture of distal end of right fibula and tibia    Depression    from Fibromyalgia diagnosis; not taking medicine. since 2001   Dizziness    rarely   Fibromyalgia    diagnosed 2001   GERD (gastroesophageal reflux disease)    Prilosec occasionally   Headache(784.0)    "sinus headaches"   History of hiatal hernia    Hypertension    since 2013   Hyperthyroidism    subclinical, no treatment; thyroid nodules   IBS (irritable bowel syndrome)    Impaired memory    states from fibromyalgia   Insomnia    takes Ambien   Jones fracture    left foot fifth metatarsal   Multiple allergies    including latex, pet dander, shellfish, pet dander    Painful orthopaedic hardware right ankle 05/27/2016   Seasonal allergies    Shortness of breath    Occasional with exertion;    Sore gums    this is why pt is on Amoxil-only takes for dental work   Tachycardia    Thyroid goiter    Varicose vein    protrudes above skin-per pt;vein popped and bruised;ultrasound done to make sure that there were no clots;noclots were found    Past Surgical History:  Procedure Laterality Date   ANTERIOR CERVICAL DECOMP/DISCECTOMY FUSION N/A 12/08/2020   Procedure: Cervical Four-Five Anterior cervical decompression/discectomy/fusion;  Surgeon: Coletta Memos, MD;  Location: [facility] OR;  Service: Neurosurgery;  Laterality: N/A;  anterior   ANTERIOR LUMBAR FUSION  09/20/2011   Procedure: ANTERIOR LUMBAR FUSION 1 LEVEL;  Surgeon: Carmela Hurt;  Location: [facility]URO ORS;  Service: Neurosurgery;  Laterality: N/A;  Lumbar five-Sacral One Anterior Lumbar Interbody Fusion /Dr. Early to Approach    APPLICATION OF WOUND VAC N/A 05/14/2022   Procedure: Removal of wound vac with closure of wound;  Surgeon: Coletta Memos, MD;  Location: [facility] OR;  Service: Neurosurgery;  Laterality: N/A;  Pt to be admitted on 05-12-2022   CERVICAL DISC SURGERY     WITH TITANIUM PLATE IN NECK---LEFT SIDE   CERVICAL SPINE SURGERY  03/24/2019   CESAREAN SECTION     EYE SURGERY Bilateral 2023  cataract   fibroidectomy     FRACTURE SURGERY  05/08/2010   Jones fracture left foot fifth metatarsal   HARDWARE REMOVAL Right 05/28/2016   Procedure: RIGHT ANKLE HARDWARE REMOVAL;  Surgeon: Salvatore Marvel, MD;  Location: [facility];  Service: Orthopedics;  Laterality: Right;   HARDWARE REMOVAL N/A 07/28/2020   Procedure: Removal of Lumbar Hardware;  Surgeon: Coletta Memos, MD;  Location: [facility] OR;  Service: Neurosurgery;  Laterality: N/A;   HERNIA REPAIR     hiatal hernia   IRRIGATION AND DEBRIDEMENT KNEE  04/09/2012   Procedure: IRRIGATION AND DEBRIDEMENT KNEE;  Surgeon: Thera Flake., MD;   Location: [facility] OR;  Service: Orthopedics;  Laterality: Right;   JOINT REPLACEMENT  01/27/2012   left total knee and Right total knee   KNEE SURGERY  2005   Left knee arthroscopy   LAMINECTOMY WITH POSTERIOR LATERAL ARTHRODESIS LEVEL 4 Bilateral 03/22/2022   Procedure: Thoracic ten to Lumbar three Posterior lateral arthrodesis with screws;  Surgeon: Coletta Memos, MD;  Location: [facility] OR;  Service: Neurosurgery;  Laterality: Bilateral;   LUMBAR WOUND DEBRIDEMENT N/A 04/08/2022   Procedure: LUMBAR WOUND DEBRIDEMENT;  Surgeon: Coletta Memos, MD;  Location: [facility] OR;  Service: Neurosurgery;  Laterality: N/A;   NASAL SEPTOPLASTY W/ TURBINOPLASTY  2007   due to recurrent sinusitis   ORIF ANKLE FRACTURE Right 06/21/2015   Procedure: OPEN REDUCTION INTERNAL FIXATION RIGHT DISTAL FIBULA  FRACTURE AND OPEN REDUCTION INTERNAL FIXATION SYNDESMOSIS ;  Surgeon: Salvatore Marvel, MD;  Location: [facility];  Service: Orthopedics;  Laterality: Right;   PARTIAL HYSTERECTOMY     SHOULDER ARTHROSCOPY W/ ROTATOR CUFF REPAIR Right    SHOULDER SURGERY Left    SPINE SURGERY  2004   Cervical plate, ACDF   STERIOD INJECTION  01/27/2012   Procedure: STEROID INJECTION;  Surgeon: Nilda Simmer, MD;  Location: [facility] OR;  Service: Orthopedics;  Laterality: Right;   TEE WITHOUT CARDIOVERSION N/A 03/01/2020   Procedure: TRANSESOPHAGEAL ECHOCARDIOGRAM (TEE);  Surgeon: Jake Bathe, MD;  Location: [facility] ENDOSCOPY;  Service: Cardiovascular;  Laterality: N/A;   THORACIC DISCECTOMY  02/16/2020   Procedure: Thoracic ten-eleven Discectomy;  Surgeon: Coletta Memos, MD;  Location: [facility] OR;  Service: Neurosurgery;;   TONSILLECTOMY  2007   TOTAL KNEE ARTHROPLASTY  01/27/2012   Procedure: TOTAL KNEE ARTHROPLASTY;  Surgeon: Nilda Simmer, MD;  Bilateral   TOTAL KNEE ARTHROPLASTY  04/06/2012   Procedure: TOTAL KNEE ARTHROPLASTY;  Surgeon: Nilda Simmer, MD;  Location: [facility] OR;  Service: Orthopedics;  Laterality: Right;   TUBAL  LIGATION     UTERINE FIBROID SURGERY     mid 200s   WOUND EXPLORATION N/A 02/27/2020   Procedure: WOUND EXPLORATION;  Surgeon: Coletta Memos, MD;  Location: [facility] OR;  Service: Neurosurgery;  Laterality: N/A;,  (wound vac upper back)   WOUND EXPLORATION N/A 03/30/2022   Procedure: THORACIC WOUND EXPLORATION WITH REPAIR OF CSF LEAK;  Surgeon: Coletta Memos, MD;  Location: [facility] OR;  Service: Neurosurgery;  Laterality: N/A;    Allergies: Monosodium glutamate, Shellfish allergy, Baclofen, Celebrex [celecoxib], Contrast media [iodinated contrast media], Diclofenac, Gadolinium, Other, Sulfa antibiotics, and Latex  Medications: Prior to Admission medications   Medication Sig Start Date End Date Taking? Authorizing Provider  albuterol (VENTOLIN HFA) 108 (90 Base) MCG/ACT inhaler Inhale 2 puffs into the lungs every 6 (six) hours as needed for wheezing or shortness of breath. 03/24/20  Yes Medina-Vargas, Monina C, NP  ascorbic acid (VITAMIN C) 500  MG tablet Take 500 mg by mouth daily.   Yes [provider]  aspirin EC 81 MG tablet Take 81 mg by mouth in the morning.   Yes [provider]  atenolol (TENORMIN) 50 MG tablet Take 1 tablet (50 mg total) by mouth daily. Patient taking differently: Take 50 mg by mouth in the morning. 03/24/20  Yes Medina-Vargas, Monina C, NP  CELEBREX 200 MG capsule Take 1 capsule (200 mg total) by mouth 2 (two) times daily. Patient taking differently: Take 200 mg by mouth 2 (two) times daily. Brand only 03/24/20  Yes Medina-Vargas, Monina C, NP  chlorzoxazone (PARAFON) 500 MG tablet Take 1 tablet (500 mg total) by mouth 3 (three) times daily. Patient taking differently: Take 500 mg by mouth 4 (four) times daily. 03/24/20  Yes Medina-Vargas, Monina C, NP  Cholecalciferol (VITAMIN D-3 PO) Take 1 tablet by mouth in the morning.   Yes [provider]  cyclobenzaprine (FLEXERIL) 10 MG tablet Take 10 mg by mouth 3 (three) times daily.   Yes [provider]  diazepam (VALIUM) 5 MG tablet Take 1 tablet (5 mg total) by mouth 2 (two) times daily. Patient taking differently: Take 5 mg by mouth See admin instructions. Take 5 mg at bedtime and additional 5 mg if needed for anxiety 03/22/20  Yes Medina-Vargas, Monina C, NP  diclofenac Sodium (VOLTAREN) 1 % GEL Apply 2 g topically 4 (four) times daily as needed (pain).   Yes [provider]  diphenhydrAMINE (BENADRYL) 25 MG tablet Take 50 mg by mouth in the morning, at noon, in the evening, and at bedtime.   Yes [provider]  ferrous sulfate 325 (65 FE) MG EC tablet Take 325 mg by mouth in the morning.   Yes [provider]  gabapentin (NEURONTIN) 600 MG tablet Take 2 tablets (1,200 mg total) by mouth 3 (three) times daily. 03/24/20  Yes Medina-Vargas, Monina C, NP  hydrochlorothiazide (HYDRODIURIL) 25 MG tablet Take 1 tablet (25 mg total) by mouth daily. One tablet by mouth in the morning. Patient taking differently: Take 12.5 mg by mouth in the morning. 03/24/20  Yes Medina-Vargas, Monina C, NP  hydrOXYzine (ATARAX/VISTARIL) 50 MG tablet Take 50 mg by mouth 2 (two) times daily as needed for anxiety (sleep). 06/23/20  Yes [provider]  lubiprostone (AMITIZA) 24 MCG capsule Take 1 capsule (24 mcg total) by mouth 2 (two) times daily with a meal. Patient taking differently: Take 48 mcg by mouth daily before lunch. 03/24/20  Yes Medina-Vargas, Monina C, NP  methocarbamol (ROBAXIN) 500 MG tablet Take 500 mg by mouth 4 (four) times daily.   Yes [provider]  Multiple Minerals-Vitamins (CALCIUM-MAGNESIUM-ZINC-D3 PO) Take 1 tablet by mouth in the morning, at noon, and at bedtime.   Yes [provider]  Multiple Vitamin (MULTIVITAMIN WITH MINERALS) TABS tablet Take 1 tablet by mouth in the morning.   Yes [provider]  omeprazole (PRILOSEC) 20 MG capsule Take 20 mg by mouth in the morning and at bedtime.    Yes [provider]   oxyCODONE-acetaminophen (PERCOCET) 10-325 MG tablet Take 1 tablet by mouth every 6 (six) hours as needed for pain. Patient taking differently: Take 1 tablet by mouth 5 (five) times daily as needed for pain. 03/22/20  Yes Medina-Vargas, Monina C, NP  cyanocobalamin (VITAMIN B12) 1000 MCG tablet Take 1,000 mcg by mouth once a week. Patient not taking: Reported on 10/28/2023    [provider]  [facility]  4 MG/0.1ML LIQD nasal spray kit Place 1 spray into the nose once. Patient not taking: Reported on 10/28/2023 05/26/20   [provider]  Propylene Glycol (SYSTANE COMPLETE) 0.6 % SOLN Place 1 drop into both eyes daily as needed (dry eyes). Patient not taking: Reported on 10/28/2023    [provider]  senna (SENOKOT) 8.6 MG TABS tablet Take 2 tablets by mouth daily before lunch. Take with Amitiza Patient not taking: Reported on 10/28/2023    [provider]     Family History  Problem Relation Age of Onset   Stroke Father        passed 2004 from pneumonia   Alcohol abuse Father    Pulmonary embolism Mother        after minor knee surgery leading to DVT   Arthritis Sister    Depression Sister    Hypertension Sister    Diabetes Other        grandmother   Anesthesia problems Neg Hx    Hypotension Neg Hx    Malignant hyperthermia Neg Hx    Pseudochol deficiency Neg Hx     Social History   Socioeconomic History   Marital status: Divorced    Spouse name: Not on file   Number of children: 1   Years of education: Not on file   Highest education level: Not on file  Occupational History   Not on file  Tobacco Use   Smoking status: Former    Current packs/day: 0.00    Average packs/day: 0.5 packs/day for 10.0 years (5.0 ttl pk-yrs)    Types: Cigarettes    Start date: 12/05/1988    Quit date: 12/06/1998    Years since quitting: 24.9   Smokeless tobacco: Never  Vaping Use   Vaping status: Never Used  Substance and Sexual Activity   Alcohol use: No   Drug  use: No   Sexual activity: Not Currently    Birth control/protection: Post-menopausal  Other Topics Concern   Not on file  Social History Narrative   Divorced. 1 son.    Disabled from fibromyalgia, arthritis, back and knee surgeries.    Graduated from High school.       Sister is a patient in our clinic, [name].    Recently cared for at [facility]. Changed to [place] as new network.    Social Drivers of Corporate investment banker Strain: Not on file  Food Insecurity: No Food Insecurity (06/05/2022)   Hunger Vital Sign    Worried About Running Out of Food in the Last Year: Never true    Ran Out of Food in the Last Year: Never true  Transportation Needs: No Transportation Needs (06/05/2022)   PRAPARE - Administrator, Civil Service (Medical): No    Lack of Transportation (Non-Medical): No  Physical Activity: Not on file  Stress: Not on file  Social Connections: Not on file     Review of Systems: A 12 point ROS discussed and pertinent positives are indicated in the HPI above.  All other systems are negative.  Review of Systems  Constitutional:  Negative for fatigue and fever.  Respiratory:  Negative for cough and shortness of breath.   Cardiovascular:  Negative for chest pain.  Gastrointestinal:  Negative for abdominal pain.  Musculoskeletal:  Positive for back pain.  Neurological:  Positive for weakness.  Psychiatric/Behavioral:  Negative for behavioral problems and confusion.     Vital Signs: BP 138/75  Pulse 74   Temp (!) 97.5 F (36.4 C)   Resp 18   LMP 11/20/2012   SpO2 96%   Physical Exam Vitals and nursing note reviewed.  Constitutional:      General: She is not in acute distress.    Appearance: Normal appearance. She is not ill-appearing.  HENT:     Mouth/Throat:     Mouth: Mucous membranes are moist.     Pharynx: Oropharynx is clear.  Cardiovascular:     Rate and Rhythm: Normal rate and regular rhythm.  Pulmonary:      Effort: Pulmonary effort is normal. No respiratory distress.     Breath sounds: Normal breath sounds.  Abdominal:     General: Abdomen is flat. There is no distension.     Palpations: Abdomen is soft.  Skin:    General: Skin is warm and dry.  Neurological:     General: No focal deficit present.     Mental Status: She is alert and oriented to person, place, and time. Mental status is at baseline.  Psychiatric:        Mood and Affect: Mood normal.        Behavior: Behavior normal.        Thought Content: Thought content normal.        Judgment: Judgment normal.      MD Evaluation Airway: WNL Heart: WNL Abdomen: WNL Chest/ Lungs: WNL ASA  Classification: 3 Mallampati/Airway Score: Two   Imaging: MR LUMBAR SPINE WO CONTRAST Result Date: 10/29/2023 CLINICAL DATA:  Initial evaluation for known spinal infection within the lower thoracic spine, evaluate for lumbar spine involvement. EXAM: MRI LUMBAR SPINE WITHOUT CONTRAST TECHNIQUE: Multiplanar, multisequence MR imaging of the lumbar spine was performed. No intravenous contrast was administered. COMPARISON:  Prior study from 10/16/2023 FINDINGS: Segmentation: Standard. Lowest well-formed disc space labeled the L5-S1 level. Alignment: Levoscoliosis. Chronic grade 1 retrolisthesis of L1 on L2, with grade 1 anterolisthesis of L4 on L5, stable. Vertebrae: Extensive susceptibility artifact related to prior posterior fusion extending from T11 through L5. Prior ALIF at L5-S1. vertebral body height maintained without acute or interval fracture. Bone marrow signal intensity within normal limits. No evidence for acute infection within the lumbar spine. No worrisome osseous lesions. Conus medullaris and cauda equina: Conus extends to the L2 level. Conus and cauda equina are grossly unremarkable, though evaluation limited by artifact. Paraspinal and other soft tissues: Chronic postoperative scarring within the posterior paraspinous soft tissues. No acute  finding. Disc levels: Underlying degenerative spondylosis with postoperative changes, stable as compared to recent MRI from 10/15/2018 fibroid these findings are fully described. No new abnormality. IMPRESSION: 1. No evidence for acute infection within the lumbar spine. 2. Postoperative changes related to prior posterior fusion extending from T11 through L5, with ALIF at L5-S1. Findings are stable as compared to recent MRI from 10/16/2023. 3. Underlying degenerative spondylosis, stable as compared to recent MRI from 10/16/2023 where these changes are fully described. Electronically Signed   By: Rise Mu M.D.   On: 10/29/2023 03:28   MR Cervical Spine Wo Contrast Result Date: 10/28/2023 CLINICAL DATA:  Bilateral upper and lower extremity weakness and numbness EXAM: MRI CERVICAL AND THORACIC SPINE WITHOUT CONTRAST TECHNIQUE: Multiplanar and multiecho pulse sequences of the cervical spine, to include the craniocervical junction and cervicothoracic junction, and the thoracic spine, were obtained without intravenous contrast. COMPARISON:  04/16/2022 MRI cervical and thoracic spine FINDINGS: MRI CERVICAL SPINE FINDINGS Alignment: Straightening of the normal cervical lordosis. 3 mm  anterolisthesis of C7 on T1, unchanged. Vertebrae: No acute fracture, evidence of discitis, or suspicious osseous lesion. ACDF C3-C7, likely with solid arthrodesis. Cord: Redemonstrated chronic myelomalacia in the anterior right spinal cord at C6-C7. Otherwise normal signal and morphology. Posterior Fossa, vertebral arteries, paraspinal tissues: No acute finding. Disc levels: C2-C3: Mild disc bulge. Facet and uncovertebral hypertrophy. No spinal canal stenosis. Mild left neural foraminal narrowing, new from the prior exam. C3-C4: Prior fusion. Left-greater-than-right facet arthropathy. No spinal canal stenosis. Mild left neural foraminal narrowing, new from the prior exam. C4-C5: Prior fusion. No residual spinal canal stenosis.  Facet arthropathy. Mild left neural foraminal narrowing, similar to prior. C5-C6: Status post fusion. Facet arthropathy. No residual spinal canal stenosis. Moderate right neural foraminal narrowing, unchanged. C6-C7: Status post fusion. Facet arthropathy. No spinal canal stenosis or neural foraminal narrowing. C7-T1: Anterolisthesis with disc unroofing. Facet arthropathy. No spinal canal stenosis. Moderate bilateral neural foraminal narrowing, unchanged. MRI THORACIC SPINE FINDINGS Alignment:  No significant listhesis. Vertebrae: Fluid in the disc space at T9-T10, which is new compared to the 04/16/2022 exam. Some increased T2 signal is also noted in the endplates at T9-T10. Solid osseous fusion of T8 and T9. Redemonstrated posterior fusion extending from T10 through the lumbar spine, without definite solid arthrodesis. No evidence of acute fracture. Cord: Evaluation of the spinal cord from T9 through the lumbar spine is limited by susceptibility artifact from the hardware. Within this limitation, the visualized spinal cord is normal in signal and morphology. Possible epidural material at T9-T10 (series 12, image 28), which causes thecal sac narrowing. Paraspinal and other soft tissues: No acute finding. Postsurgical changes in the soft tissues posterior to T9-L4 Disc levels: Moderate thecal sac narrowing at T9-T10, in part secondary to suspected epidural material (series 12, images 27-29), which may represent phlegmon, although evaluation is limited by the absence of intravenous contrast. No other spinal canal stenosis is seen. Redemonstrated moderate right neural foraminal narrowing at T1-T2, mild bilateral neural foraminal narrowing at T3-T4, and mild left neural foraminal narrowing at T9-T10. Evaluation of the lower thoracic neural foramina is limited by susceptibility artifact, particularly on the left. IMPRESSION: CERVICAL SPINE: 1. C3-C7 ACDF, likely with solid arthrodesis. No residual spinal canal stenosis.  Mild left neural foraminal narrowing at C3-C4 and C4-C5, new from the prior exam. 2. C5-C6 moderate right neural foraminal narrowing, unchanged. 3. C7-T1 moderate bilateral neural foraminal narrowing, unchanged. 4. Redemonstrated chronic myelomalacia in the anterior right spinal cord at C6-C7. THORACIC SPINE: 1. Fluid in the disc space at T9-T10, which is new compared to the 04/16/2022 exam, with some increased T2 signal in the endplates at T9-T10. These findings are concerning for discitis/osteomyelitis. 2. Moderate thecal sac narrowing at T9-T10, in part secondary to suspected epidural material, which may represent phlegmon, although evaluation is limited by the absence of intravenous contrast. 3. Redemonstrated posterior fusion extending from T10 through the lumbar spine, without definite solid arthrodesis. 4. Redemonstrated moderate right neural foraminal narrowing at T1-T2, mild bilateral neural foraminal narrowing at T3-T4, and mild left neural foraminal narrowing at T9-T10. These results were called by telephone at the time of interpretation on 10/28/2023 at 7:22 pm to provider [facility] , who verbally acknowledged these results. Electronically Signed   By: Wiliam Ke M.D.   On: 10/28/2023 19:23   MR THORACIC SPINE WO CONTRAST Result Date: 10/28/2023 CLINICAL DATA:  Bilateral upper and lower extremity weakness and numbness EXAM: MRI CERVICAL AND THORACIC SPINE WITHOUT CONTRAST TECHNIQUE: Multiplanar and multiecho pulse sequences  of the cervical spine, to include the craniocervical junction and cervicothoracic junction, and the thoracic spine, were obtained without intravenous contrast. COMPARISON:  04/16/2022 MRI cervical and thoracic spine FINDINGS: MRI CERVICAL SPINE FINDINGS Alignment: Straightening of the normal cervical lordosis. 3 mm anterolisthesis of C7 on T1, unchanged. Vertebrae: No acute fracture, evidence of discitis, or suspicious osseous lesion. ACDF C3-C7, likely with solid  arthrodesis. Cord: Redemonstrated chronic myelomalacia in the anterior right spinal cord at C6-C7. Otherwise normal signal and morphology. Posterior Fossa, vertebral arteries, paraspinal tissues: No acute finding. Disc levels: C2-C3: Mild disc bulge. Facet and uncovertebral hypertrophy. No spinal canal stenosis. Mild left neural foraminal narrowing, new from the prior exam. C3-C4: Prior fusion. Left-greater-than-right facet arthropathy. No spinal canal stenosis. Mild left neural foraminal narrowing, new from the prior exam. C4-C5: Prior fusion. No residual spinal canal stenosis. Facet arthropathy. Mild left neural foraminal narrowing, similar to prior. C5-C6: Status post fusion. Facet arthropathy. No residual spinal canal stenosis. Moderate right neural foraminal narrowing, unchanged. C6-C7: Status post fusion. Facet arthropathy. No spinal canal stenosis or neural foraminal narrowing. C7-T1: Anterolisthesis with disc unroofing. Facet arthropathy. No spinal canal stenosis. Moderate bilateral neural foraminal narrowing, unchanged. MRI THORACIC SPINE FINDINGS Alignment:  No significant listhesis. Vertebrae: Fluid in the disc space at T9-T10, which is new compared to the 04/16/2022 exam. Some increased T2 signal is also noted in the endplates at T9-T10. Solid osseous fusion of T8 and T9. Redemonstrated posterior fusion extending from T10 through the lumbar spine, without definite solid arthrodesis. No evidence of acute fracture. Cord: Evaluation of the spinal cord from T9 through the lumbar spine is limited by susceptibility artifact from the hardware. Within this limitation, the visualized spinal cord is normal in signal and morphology. Possible epidural material at T9-T10 (series 12, image 28), which causes thecal sac narrowing. Paraspinal and other soft tissues: No acute finding. Postsurgical changes in the soft tissues posterior to T9-L4 Disc levels: Moderate thecal sac narrowing at T9-T10, in part secondary to  suspected epidural material (series 12, images 27-29), which may represent phlegmon, although evaluation is limited by the absence of intravenous contrast. No other spinal canal stenosis is seen. Redemonstrated moderate right neural foraminal narrowing at T1-T2, mild bilateral neural foraminal narrowing at T3-T4, and mild left neural foraminal narrowing at T9-T10. Evaluation of the lower thoracic neural foramina is limited by susceptibility artifact, particularly on the left. IMPRESSION: CERVICAL SPINE: 1. C3-C7 ACDF, likely with solid arthrodesis. No residual spinal canal stenosis. Mild left neural foraminal narrowing at C3-C4 and C4-C5, new from the prior exam. 2. C5-C6 moderate right neural foraminal narrowing, unchanged. 3. C7-T1 moderate bilateral neural foraminal narrowing, unchanged. 4. Redemonstrated chronic myelomalacia in the anterior right spinal cord at C6-C7. THORACIC SPINE: 1. Fluid in the disc space at T9-T10, which is new compared to the 04/16/2022 exam, with some increased T2 signal in the endplates at T9-T10. These findings are concerning for discitis/osteomyelitis. 2. Moderate thecal sac narrowing at T9-T10, in part secondary to suspected epidural material, which may represent phlegmon, although evaluation is limited by the absence of intravenous contrast. 3. Redemonstrated posterior fusion extending from T10 through the lumbar spine, without definite solid arthrodesis. 4. Redemonstrated moderate right neural foraminal narrowing at T1-T2, mild bilateral neural foraminal narrowing at T3-T4, and mild left neural foraminal narrowing at T9-T10. These results were called by telephone at the time of interpretation on 10/28/2023 at 7:22 pm to provider [facility] , who verbally acknowledged these results. Electronically Signed   By: Jill Side  Vasan M.D.   On: 10/28/2023 19:23   MR LUMBAR SPINE WO CONTRAST Result Date: 10/16/2023 CLINICAL DATA:  Initial evaluation for low back pain. EXAM: MRI LUMBAR  SPINE WITHOUT CONTRAST TECHNIQUE: Multiplanar, multisequence MR imaging of the lumbar spine was performed. No intravenous contrast was administered. COMPARISON:  Prior study from 03/17/2022. FINDINGS: Segmentation: Standard. Lowest well-formed disc space labeled the L5-S1 level. Alignment: Levoscoliosis. Chronic grade 1 retrolisthesis of L1 on L2, with grade 1 anterolisthesis of L4 on L5, stable. Vertebrae: Extensive susceptibility artifact related to prior posterior fusion extending from T11 through L5. Prior ALIF at L5-S1. vertebral body height maintained without acute or chronic fracture. Bone marrow signal intensity within normal limits. No abnormal marrow edema. Conus medullaris and cauda equina: Conus extends to the L2 level. Conus and cauda equina are grossly unremarkable, though evaluation limited by susceptibility artifact. Paraspinal and other soft tissues: Chronic postoperative scarring present within the posterior paraspinous soft tissues. No acute finding. Disc levels: L1-2: Advanced degenerative intervertebral disc space narrowing with diffuse disc bulge and reactive endplate spurring, asymmetric to the left. Moderate facet hypertrophy. Prior posterior fusion. Residual mild spinal stenosis. Mild bilateral L1 foraminal narrowing. L2-3: Diffuse disc bulge with reactive endplate spurring, asymmetric to the right. Prior posterior fusion and decompression. Residual mild narrowing of the right lateral recess. Central canal remains patent. Suspected moderate bilateral L2 foraminal stenosis. L3-4: Mild disc bulge with disc desiccation. Prior posterior decompression and fusion. No residual spinal stenosis. Mild left with mild to moderate right L3 foraminal narrowing. L4-5: Anterolisthesis. Mild disc bulge with disc desiccation. Prior posterior decompression with fusion. No residual spinal stenosis. Mild right L4 foraminal narrowing. Left neural foramina remains patent. L5-S1: Prior fusion. Central endplate spur  indents the ventral epidural fat. Mild facet hypertrophy. No significant spinal stenosis. Foramina are patent. IMPRESSION: 1. No acute abnormality within the lumbar spine. 2. Prior posterior fusion extending from T11 through L5, with ALIF at L5-S1. 3. Degenerative spondylosis at L1-2 with resultant mild spinal stenosis, with mild bilateral L1 foraminal narrowing. 4. Degenerative disc bulging with facet hypertrophy at L2-3 with resultant mild right lateral recess stenosis, with suspected moderate bilateral L2 foraminal narrowing. 5. Mild to moderate right L3 foraminal stenosis related to disc bulge and facet hypertrophy. Electronically Signed   By: Rise Mu M.D.   On: 10/16/2023 23:26    Labs:  CBC: Recent Labs    09/23/23 1345 10/16/23 1739 10/28/23 1240 10/29/23 0518  WBC 10.3 15.7*  15.5* 8.0 8.1  HGB 11.7 13.2  13.1 13.8 12.9  HCT 37.4 43.5  43.4 46.3* 42.6  PLT 350 393  369 289 334    COAGS: No results for input(s): "INR", "APTT" in the last 8760 hours.  BMP: Recent Labs    09/23/23 1345 10/16/23 1739 10/28/23 1240 10/29/23 0518  NA 140 141 140 139  K 4.6 4.1 3.9 3.6  CL 103 101 104 101  CO2 30 26 28 25   GLUCOSE 91 109* 104* 107*  BUN 18 14 14 15   CALCIUM 9.5 9.7 9.7 9.6  CREATININE 0.85 0.63 0.64 0.58  GFRNONAA  --  >60 >60 >60    LIVER FUNCTION TESTS: Recent Labs    01/23/23 1024 09/23/23 1345 10/28/23 1240  BILITOT 0.2 0.2 0.3  AST 14 14 25   ALT 11 14 24   ALKPHOS  --   --  124  PROT 6.5 6.3 6.3*  ALBUMIN  --   --  3.7    TUMOR MARKERS: No results  for input(s): "AFPTM", "CEA", "CA199", "CHROMGRNA" in the last 8760 hours.  Assessment and Plan: T9 discitis Patient with history of multiple back surgeries, chronic infections presents with new onset back pain after recent discontinuation of antibiotics.  MR imaging shows possible T9 discitis with phlegmon.  IR consulted for aspiration/biopsy.  Case reviewed by Dr. Tommie Sams who  approves procedure.  She has been NPO.   Risks and benefits of disc aspiration was discussed with the patient and/or patient's family including, but not limited to bleeding, infection, damage to adjacent structures or low yield requiring additional tests.  All of the questions were answered and there is agreement to proceed.  Consent signed and in chart.   Thank you for this interesting consult.  I greatly enjoyed meeting [name] and look forward to participating in their care.  A copy of this report was sent to the requesting provider on this date.  Electronically Signed: Hoyt Koch, PA 10/29/2023, 12:43 PM   I spent a total of 40 Minutes    in face to face in clinical consultation, greater than 50% of which was counseling/coordinating care for discitis.

## 2023-10-29 NOTE — Procedures (Signed)
INTERVENTIONAL NEURORADIOLOGY BRIEF POSTPROCEDURE NOTE  FLUOROSCOPY GUIDED T9-10 FINE NEEDLE DISC ASPIRATION  Attending physician: Baldemar Lenis, MD  Diagnosis: Discitis  Access site: Percutaneous right posterolateral, extrapedicular.  Anesthesia: IR sedation: Moderate sedation.  Medication used: 2 mg Versed IV; 100 mcg Fentanyl IV.  Complications: None.  Estimated blood loss: None.  Specimen:  2 mL disc aspirate.  A 17 gauge needle was advanced into the T9-10 disc space under fluoroscopic guidance. Multiple attempted aspiration returned dry. Approximately 2 cc of sterile saline were then injected and aspirated back with pinkish return. Samples were sent to laboratory.  The patient tolerated the procedure well without incident or complication and is in stable condition.

## 2023-10-29 NOTE — Hospital Course (Addendum)
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  Subsequently she has been about a year and a half on Keflex before Dr. Drue Second decided to try her off of Keflex to see if there is any recurrence.  She went off of antibiotics about late May 2022 and did not have any recurrence of MSSA.  Subsequently she had surgical screw loosening back pain and ultimately he did surgery in June 2023.  This time is unfortunate complicated by postop wound infection sepsis and wound culture that grew out Enterobacter cloacae.  She is subsequently then started on Bactrim for a year and a half but that she thought she is having allergies and side effects given an acid medicine rash and headache was was itching so she elected to stop Bactrim in December 2024.  For last few weeks she has been having numbness in upper extremities and numbness and weakness in the bilateral extremities with no new bowel or bladder incontinence.  MRI of 1 9 of the L-spine did not show any findings of cord compression or infection then MRI in the ED on 10/28/2023 of the C and T-spine does show findings concerning for T9 discitis/osteomyelitis and T9 epidural phlegmon/abscess with moderate thecal compression.  Neurosurgery was consulted and recommended holding off starting antibiotics for the moment and ID was also consulted.  IR was also consulted for aspiration/biopsy and Dr. Tommie Sams approved the procedure for aspiration.  Aspiration done and Cx showing NGTD. Now ID started the patient on IV Cefepime and recommending the patient have a prolonged course of antibiotics with IV cefepime. PT/OT recommending CIR.   Assessment and Plan:  T9 Discitis/Osteomyelitis with associated LE Weakness and Numbness -Pt with discitis,  phlegmon, and moderate thecal sac impingement at T9. -Suspicious that source may be recurrence of a suppressed chronic spinal hardware infection in setting of pt taking herself off on bactrim in Dec.   -No other obvious sources this time around (ie open wounds, recent surgeries, etc). -Potential organisms at the top of my suspect list include enterobacter cloacae (2023 culture) and MSSA (2021 culture). -No SIRS or suggestion of systemic involvement at this point. -Dr. Julian Reil discussed with Neurosurgery who recommended holding off on starting ABx for the moment to allow for best chance of culture positive -Most likely plan will be IR aspiration/biopsy for cultures; IR consulted and Dr. Tommie Sams approved the procedure -Dr. Julian Reil asked given MRI L spine on 1/9: They do want repeat MRI L spine (with and without contrast) as well as T spine (with contrast, already has without today) to be ordered and it showed  -EDP also notified ID of pts admission and they evaluated; she has a known history of MSSA and Enterobacter Cloacea and did well on Bactrim suppression until recently when she came in with excess rashes on her elbows and was attributing this to Bactrim. -Blood Cx x2 show NGTD at 3 Days -Continue with Celecoxib 200 mg po BID, Chlorzoxazone 500 mg po 4 times daily, and methocarbamol 500 mg po 4 times daily -C/w Gabapentin mg po TID -Inflammatory Markers are not elevated  -ID consulted and cultures obtained and plan on starting cefepime IV after aspiration; Now on Day 2 and per ID they are planning on a prolonged course with 6 weeks of IV antibiotics  with IV cefepime and there is no positive growth -Given that she has no growth infectious disease recommending continuing IV cefepime based on her previous Enterobacter growth as well as MSSA and PICC line is being ordered with OPAT orders being placed and recommendation for IV cefepime 2 g every 8 hours with the end date being December 11, 2023 -Need PT OT to further evaluate and treat given her lower extremity weakness and numbness and they are recommending CIR  Hyperglycemia  -Check HbA1c in the AM; Last HbA1c was 6.0 in our system -If necessary will place on sensitive NovoLog/scale insulin before meals and at bedtime -Glucose Level Trend:  Recent Labs  Lab 10/16/23 1739 10/28/23 1240 10/29/23 0518 10/30/23 0508 10/31/23 0538  GLUCOSE 109* 104* 107* 91 107*    Polypharmacy -Will need Caution with her multiple meds, will continue home meds for the moment.  Hyponatremia -Na+ Trend: Recent Labs  Lab 10/16/23 1739 10/28/23 1240 10/29/23 0518 10/30/23 0508 10/31/23 0538  NA 141 140 139 133* 139  -Continue to Monitor and Trend and repeat CMP in the AM   Hypertension -Continue Atenolol 50 mg po daily -Will hold Hydrochlorothiazide  -Continue to Monitor BP per Protocol -Last BP reading was 120/73  GERD/GI Prophylaxis -C/w PPI with Pantoprazole 40 mg po daily   Hypoalbuminemia -Patient's Albumin Trend: Recent Labs  Lab 10/28/23 1240 10/30/23 0508 10/31/23 0538  ALBUMIN 3.7 3.2* 3.0*  -Continue to Monitor and Trend and repeat CMP in the AM'  Class III Obesity (Morbid) -Complicates overall prognosis and care -Estimated body mass index is 40.97 kg/m as calculated from the following:   Height as of 09/23/23: 5\' 2"  (1.575 m).   Weight as of this encounter: 101.6 kg.  -Weight Loss and Dietary Counseling given

## 2023-10-29 NOTE — ED Notes (Signed)
Patient remains in IR

## 2023-10-29 NOTE — Progress Notes (Signed)
Patient ID: [name], female   DOB: 07-18-1961, 63 y.o.   MRN: [id number] BP (!) 120/98 (BP Location: Right Arm)   Pulse 73   Temp 98 F (36.7 C) (Oral)   Resp 18   Wt 101.6 kg   LMP 11/20/2012   SpO2 97%   BMI 40.97 kg/m  Patient currently asleep. Had biopsy today, and being treated for osteomyelitis. Hx of infections when I have operated and also non compliance with meds. Will return tomorrow.

## 2023-10-29 NOTE — H&P (Signed)
History and Physical    Patient: [name] XBM:[id number] DOB: 02-14-1961 DOA: 10/28/2023 DOS: the patient was seen and examined on 10/29/2023 PCP: Irven Coe, MD  Patient coming from: Home  Chief Complaint:  Chief Complaint  Patient presents with   Numbness   HPI: [name] is a 63 y.o. female with medical history significant of multiple spine surgeries, chronic back pain, prior spine infections / infection of hardware.  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  Spent about 1.5 years on Keflex before Dr. Drue Second decided to try pt off of keflex and see if any recurrence.  Went off of ABx in about late May 2022.  Didn't seem to have any recurrence of MSSA.  Had surgical screw loosening / back pain, ultimately ended up needing surgery in June 2023.  This time unfortunately complicated by post op wound infection, sepsis, with wound culture growing out enterobacter cloacae (resistant to 1st gen cephalosporins).  Pt started on bactrim which was continued for past ~1.5 years by Dr. Thedore Mins.  Pt however felt that she was having allergies / side effects to the bactrim (headache, itching / rash) chronically and elected to stop the bactrim in Dec 2024.  For last few weeks pt has had numbness in upper extremities, numbness and weakness in BLE.  No new bladder nor bowel in continence.  MRI 1/9 of L spine didn't show any new finding of cord compression / infection findings, etc, (or anything really beyond more degenerative disk / back disease).  MRI done in ED today 1/21 of C and T spine does show findings concerning for T9 diskitis / osteomyelitis and T9 epidural phlegmon / abscess with moderate thecal compression.    Review of Systems: As mentioned in the history of present illness. All other systems reviewed and are negative. Past Medical History:  Diagnosis Date   Anemia    Ankle syndesmosis disruption    Anxiety    takes Ativan and Valium, after mother passed    Arthritis    bilateral knees s/p knee replacement bilaterally   Asthma    Bronchitis    Bruising    pt states unexplained d/t fibromyalgia   Chronic back pain    2012 tailbone surgery and 3 lower discs.    Closed fracture of distal end of right fibula and tibia    Depression    from Fibromyalgia diagnosis; not taking medicine. since 2001   Dizziness    rarely   Fibromyalgia    diagnosed 2001   GERD (gastroesophageal reflux disease)    Prilosec occasionally   Headache(784.0)    "sinus headaches"   History of hiatal hernia    Hypertension    since 2013   Hyperthyroidism    subclinical, no treatment; thyroid nodules   IBS (irritable bowel syndrome)    Impaired memory    states from fibromyalgia   Insomnia    takes Ambien   Jones fracture    left foot fifth metatarsal   Multiple allergies    including latex, pet dander, shellfish, pet dander   Painful orthopaedic hardware right ankle 05/27/2016   Seasonal allergies    Shortness of breath    Occasional with exertion;    Sore gums    this is why pt is on Amoxil-only takes for dental work   Tachycardia    Thyroid goiter    Varicose vein    protrudes above skin-per pt;vein popped and bruised;ultrasound done to make sure that  there were no clots;noclots were found   Past Surgical History:  Procedure Laterality Date   ANTERIOR CERVICAL DECOMP/DISCECTOMY FUSION N/A 12/08/2020   Procedure: Cervical Four-Five Anterior cervical decompression/discectomy/fusion;  Surgeon: Coletta Memos, MD;  Location: [facility] OR;  Service: Neurosurgery;  Laterality: N/A;  anterior   ANTERIOR LUMBAR FUSION  09/20/2011   Procedure: ANTERIOR LUMBAR FUSION 1 LEVEL;  Surgeon: Carmela Hurt;  Location: [facility] NEURO ORS;  Service: Neurosurgery;  Laterality: N/A;  Lumbar five-Sacral One Anterior Lumbar Interbody Fusion /Dr. Early to Approach    APPLICATION OF WOUND VAC N/A 05/14/2022   Procedure: Removal of wound vac with closure of wound;  Surgeon: Coletta Memos,  MD;  Location: [facility] OR;  Service: Neurosurgery;  Laterality: N/A;  Pt to be admitted on 05-12-2022   CERVICAL DISC SURGERY     WITH TITANIUM PLATE IN NECK---LEFT SIDE   CERVICAL SPINE SURGERY  03/24/2019   CESAREAN SECTION     EYE SURGERY Bilateral 2023   cataract   fibroidectomy     FRACTURE SURGERY  05/08/2010   Jones fracture left foot fifth metatarsal   HARDWARE REMOVAL Right 05/28/2016   Procedure: RIGHT ANKLE HARDWARE REMOVAL;  Surgeon: Salvatore Marvel, MD;  Location: [facility];  Service: Orthopedics;  Laterality: Right;   HARDWARE REMOVAL N/A 07/28/2020   Procedure: Removal of Lumbar Hardware;  Surgeon: Coletta Memos, MD;  Location: [facility] OR;  Service: Neurosurgery;  Laterality: N/A;   HERNIA REPAIR     hiatal hernia   IRRIGATION AND DEBRIDEMENT KNEE  04/09/2012   Procedure: IRRIGATION AND DEBRIDEMENT KNEE;  Surgeon: Thera Flake., MD;  Location: [facility] OR;  Service: Orthopedics;  Laterality: Right;   JOINT REPLACEMENT  01/27/2012   left total knee and Right total knee   KNEE SURGERY  2005   Left knee arthroscopy   LAMINECTOMY WITH POSTERIOR LATERAL ARTHRODESIS LEVEL 4 Bilateral 03/22/2022   Procedure: Thoracic ten to Lumbar three Posterior lateral arthrodesis with screws;  Surgeon: Coletta Memos, MD;  Location: [facility] OR;  Service: Neurosurgery;  Laterality: Bilateral;   LUMBAR WOUND DEBRIDEMENT N/A 04/08/2022   Procedure: LUMBAR WOUND DEBRIDEMENT;  Surgeon: Coletta Memos, MD;  Location: [facility] OR;  Service: Neurosurgery;  Laterality: N/A;   NASAL SEPTOPLASTY W/ TURBINOPLASTY  2007   due to recurrent sinusitis   ORIF ANKLE FRACTURE Right 06/21/2015   Procedure: OPEN REDUCTION INTERNAL FIXATION RIGHT DISTAL FIBULA  FRACTURE AND OPEN REDUCTION INTERNAL FIXATION SYNDESMOSIS ;  Surgeon: Salvatore Marvel, MD;  Location: [facility];  Service: Orthopedics;  Laterality: Right;   PARTIAL HYSTERECTOMY     SHOULDER ARTHROSCOPY W/ ROTATOR CUFF REPAIR Right    SHOULDER SURGERY Left     SPINE SURGERY  2004   Cervical plate, ACDF   STERIOD INJECTION  01/27/2012   Procedure: STEROID INJECTION;  Surgeon: Nilda Simmer, MD;  Location: [facility] OR;  Service: Orthopedics;  Laterality: Right;   TEE WITHOUT CARDIOVERSION N/A 03/01/2020   Procedure: TRANSESOPHAGEAL ECHOCARDIOGRAM (TEE);  Surgeon: Jake Bathe, MD;  Location: [facility] ENDOSCOPY;  Service: Cardiovascular;  Laterality: N/A;   THORACIC DISCECTOMY  02/16/2020   Procedure: Thoracic ten-eleven Discectomy;  Surgeon: Coletta Memos, MD;  Location: [facility] OR;  Service: Neurosurgery;;   TONSILLECTOMY  2007   TOTAL KNEE ARTHROPLASTY  01/27/2012   Procedure: TOTAL KNEE ARTHROPLASTY;  Surgeon: Nilda Simmer, MD;  Bilateral   TOTAL KNEE ARTHROPLASTY  04/06/2012   Procedure: TOTAL KNEE ARTHROPLASTY;  Surgeon: Nilda Simmer, MD;  Location: [facility] OR;  Service: Orthopedics;  Laterality: Right;   TUBAL LIGATION     UTERINE FIBROID SURGERY     mid 200s   WOUND EXPLORATION N/A 02/27/2020   Procedure: WOUND EXPLORATION;  Surgeon: Coletta Memos, MD;  Location: [facility] OR;  Service: Neurosurgery;  Laterality: N/A;,  (wound vac upper back)   WOUND EXPLORATION N/A 03/30/2022   Procedure: THORACIC WOUND EXPLORATION WITH REPAIR OF CSF LEAK;  Surgeon: Coletta Memos, MD;  Location: [facility] OR;  Service: Neurosurgery;  Laterality: N/A;   Social History:  reports that she quit smoking about 24 years ago. Her smoking use included cigarettes. She started smoking about 34 years ago. She has a 5 pack-year smoking history. She has never used smokeless tobacco. She reports that she does not drink alcohol and does not use drugs.  Allergies  Allergen Reactions   Monosodium Glutamate Anaphylaxis and Swelling    Eyes swollen shut, facial swelling, tongue swelling.   Shellfish Allergy Anaphylaxis   Baclofen Nausea Only    Dizziness and increase muscle spasm   Celebrex [Celecoxib] Itching    Only allergic to generic brand   Contrast Media [Iodinated Contrast Media]  Itching and Nausea Only    "could not walk"   Diclofenac Itching    Generic Diclefenac gel causes itching. Can take the name brand Voltaren gel   Other Other (See Comments)    Pet dander,   Green rubber piece to hold mouth open (oval shape)     Sulfa Antibiotics Other (See Comments)    Migraines   Latex Itching and Rash    Latex glove with powder    Family History  Problem Relation Age of Onset   Stroke Father        passed 2004 from pneumonia   Alcohol abuse Father    Pulmonary embolism Mother        after minor knee surgery leading to DVT   Arthritis Sister    Depression Sister    Hypertension Sister    Diabetes Other        grandmother   Anesthesia problems Neg Hx    Hypotension Neg Hx    Malignant hyperthermia Neg Hx    Pseudochol deficiency Neg Hx     Prior to Admission medications   Medication Sig Start Date End Date Taking? Authorizing Provider  albuterol (VENTOLIN HFA) 108 (90 Base) MCG/ACT inhaler Inhale 2 puffs into the lungs every 6 (six) hours as needed for wheezing or shortness of breath. 03/24/20  Yes Medina-Vargas, Monina C, NP  ascorbic acid (VITAMIN C) 500 MG tablet Take 500 mg by mouth daily.   Yes [provider]  aspirin EC 81 MG tablet Take 81 mg by mouth in the morning.   Yes [provider]  atenolol (TENORMIN) 50 MG tablet Take 1 tablet (50 mg total) by mouth daily. Patient taking differently: Take 50 mg by mouth in the morning. 03/24/20  Yes Medina-Vargas, Monina C, NP  CELEBREX 200 MG capsule Take 1 capsule (200 mg total) by mouth 2 (two) times daily. Patient taking differently: Take 200 mg by mouth 2 (two) times daily. Brand only 03/24/20  Yes Medina-Vargas, Monina C, NP  chlorzoxazone (PARAFON) 500 MG tablet Take 1 tablet (500 mg total) by mouth 3 (three) times daily. Patient taking differently: Take 500 mg by mouth 4 (four) times daily. 03/24/20  Yes Medina-Vargas, Monina C, NP  Cholecalciferol (VITAMIN D-3 PO) Take 1 tablet by  mouth in the morning.  Yes [provider]  cyclobenzaprine (FLEXERIL) 10 MG tablet Take 10 mg by mouth 3 (three) times daily.   Yes [provider]  diazepam (VALIUM) 5 MG tablet Take 1 tablet (5 mg total) by mouth 2 (two) times daily. Patient taking differently: Take 5 mg by mouth See admin instructions. Take 5 mg at bedtime and additional 5 mg if needed for anxiety 03/22/20  Yes Medina-Vargas, Monina C, NP  diclofenac Sodium (VOLTAREN) 1 % GEL Apply 2 g topically 4 (four) times daily as needed (pain).   Yes [provider]  diphenhydrAMINE (BENADRYL) 25 MG tablet Take 50 mg by mouth in the morning, at noon, in the evening, and at bedtime.   Yes [provider]  ferrous sulfate 325 (65 FE) MG EC tablet Take 325 mg by mouth in the morning.   Yes [provider]  gabapentin (NEURONTIN) 600 MG tablet Take 2 tablets (1,200 mg total) by mouth 3 (three) times daily. 03/24/20  Yes Medina-Vargas, Monina C, NP  hydrochlorothiazide (HYDRODIURIL) 25 MG tablet Take 1 tablet (25 mg total) by mouth daily. One tablet by mouth in the morning. Patient taking differently: Take 12.5 mg by mouth in the morning. 03/24/20  Yes Medina-Vargas, Monina C, NP  hydrOXYzine (ATARAX/VISTARIL) 50 MG tablet Take 50 mg by mouth 2 (two) times daily as needed for anxiety (sleep). 06/23/20  Yes [provider]  lubiprostone (AMITIZA) 24 MCG capsule Take 1 capsule (24 mcg total) by mouth 2 (two) times daily with a meal. Patient taking differently: Take 48 mcg by mouth daily before lunch. 03/24/20  Yes Medina-Vargas, Monina C, NP  methocarbamol (ROBAXIN) 500 MG tablet Take 500 mg by mouth 4 (four) times daily.   Yes [provider]  Multiple Minerals-Vitamins (CALCIUM-MAGNESIUM-ZINC-D3 PO) Take 1 tablet by mouth in the morning, at noon, and at bedtime.   Yes [provider]  Multiple Vitamin (MULTIVITAMIN WITH MINERALS) TABS tablet Take 1 tablet by mouth in the morning.    Yes [provider]  omeprazole (PRILOSEC) 20 MG capsule Take 20 mg by mouth in the morning and at bedtime.    Yes [provider]  oxyCODONE-acetaminophen (PERCOCET) 10-325 MG tablet Take 1 tablet by mouth every 6 (six) hours as needed for pain. Patient taking differently: Take 1 tablet by mouth 5 (five) times daily as needed for pain. 03/22/20  Yes Medina-Vargas, Monina C, NP  cyanocobalamin (VITAMIN B12) 1000 MCG tablet Take 1,000 mcg by mouth once a week. Patient not taking: Reported on 10/28/2023    [provider]  NARCAN 4 MG/0.1ML LIQD nasal spray kit Place 1 spray into the nose once. Patient not taking: Reported on 10/28/2023 05/26/20   [provider]  Propylene Glycol (SYSTANE COMPLETE) 0.6 % SOLN Place 1 drop into both eyes daily as needed (dry eyes). Patient not taking: Reported on 10/28/2023    [provider]  senna (SENOKOT) 8.6 MG TABS tablet Take 2 tablets by mouth daily before lunch. Take with Amitiza Patient not taking: Reported on 10/28/2023    [provider]    Physical Exam: Vitals:   10/28/23 1218 10/28/23 1220 10/28/23 1615 10/28/23 2004  BP:  130/85 (!) 158/76 (!) 143/76  Pulse:  78 67 76  Resp:  17 16 18   Temp:  98.4 F (36.9 C) 97.7 F (36.5 C) 97.8 F (36.6 C)  TempSrc:  Oral Oral   SpO2: 94% 98% 96% 100%   Constitutional: NAD, calm, comfortable Respiratory: clear to  auscultation bilaterally, no wheezing, no crackles. Normal respiratory effort. No accessory muscle use.  Cardiovascular: Regular rate and rhythm, no murmurs / rubs / gallops. No extremity edema. 2+ pedal pulses. No carotid bruits.  Abdomen: no tenderness, no masses palpated. No hepatosplenomegaly. Bowel sounds positive.  Neurologic: CN 2-12 grossly intact. Sensation intact, DTR normal. Strength 5/5 in all 4.  Psychiatric: Normal judgment and insight. Alert and oriented x 3. Normal mood.   Data Reviewed:    Labs on Admission: I have  personally reviewed following labs and imaging studies  CBC: Recent Labs  Lab 10/28/23 1240  WBC 8.0  NEUTROABS 4.8  HGB 13.8  HCT 46.3*  MCV 88.4  PLT 289   Basic Metabolic Panel: Recent Labs  Lab 10/28/23 1240  NA 140  K 3.9  CL 104  CO2 28  GLUCOSE 104*  BUN 14  CREATININE 0.64  CALCIUM 9.7   GFR: CrCl cannot be calculated (Unknown ideal weight.). Liver Function Tests: Recent Labs  Lab 10/28/23 1240  AST 25  ALT 24  ALKPHOS 124  BILITOT 0.3  PROT 6.3*  ALBUMIN 3.7   No results for input(s): "LIPASE", "AMYLASE" in the last 168 hours. No results for input(s): "AMMONIA" in the last 168 hours. Coagulation Profile: No results for input(s): "INR", "PROTIME" in the last 168 hours. Cardiac Enzymes: No results for input(s): "CKTOTAL", "CKMB", "CKMBINDEX", "TROPONINI" in the last 168 hours. BNP (last 3 results) No results for input(s): "PROBNP" in the last 8760 hours. HbA1C: No results for input(s): "HGBA1C" in the last 72 hours. CBG: No results for input(s): "GLUCAP" in the last 168 hours. Lipid Profile: No results for input(s): "CHOL", "HDL", "LDLCALC", "TRIG", "CHOLHDL", "LDLDIRECT" in the last 72 hours. Thyroid Function Tests: No results for input(s): "TSH", "T4TOTAL", "FREET4", "T3FREE", "THYROIDAB" in the last 72 hours. Anemia Panel: No results for input(s): "VITAMINB12", "FOLATE", "FERRITIN", "TIBC", "IRON", "RETICCTPCT" in the last 72 hours. Urine analysis:    Component Value Date/Time   COLORURINE YELLOW 10/16/2023 2042   APPEARANCEUR HAZY (A) 10/16/2023 2042   LABSPEC 1.018 10/16/2023 2042   PHURINE 6.0 10/16/2023 2042   GLUCOSEU NEGATIVE 10/16/2023 2042   HGBUR NEGATIVE 10/16/2023 2042   BILIRUBINUR NEGATIVE 10/16/2023 2042   BILIRUBINUR NEG 12/30/2013 1603   KETONESUR NEGATIVE 10/16/2023 2042   PROTEINUR NEGATIVE 10/16/2023 2042   UROBILINOGEN 0.2 12/30/2013 1603   UROBILINOGEN 0.2 04/09/2012 0649   NITRITE NEGATIVE 10/16/2023 2042    LEUKOCYTESUR NEGATIVE 10/16/2023 2042    Radiological Exams on Admission: MR Cervical Spine Wo Contrast Result Date: 10/28/2023 CLINICAL DATA:  Bilateral upper and lower extremity weakness and numbness EXAM: MRI CERVICAL AND THORACIC SPINE WITHOUT CONTRAST TECHNIQUE: Multiplanar and multiecho pulse sequences of the cervical spine, to include the craniocervical junction and cervicothoracic junction, and the thoracic spine, were obtained without intravenous contrast. COMPARISON:  04/16/2022 MRI cervical and thoracic spine FINDINGS: MRI CERVICAL SPINE FINDINGS Alignment: Straightening of the normal cervical lordosis. 3 mm anterolisthesis of C7 on T1, unchanged. Vertebrae: No acute fracture, evidence of discitis, or suspicious osseous lesion. ACDF C3-C7, likely with solid arthrodesis. Cord: Redemonstrated chronic myelomalacia in the anterior right spinal cord at C6-C7. Otherwise normal signal and morphology. Posterior Fossa, vertebral arteries, paraspinal tissues: No acute finding. Disc levels: C2-C3: Mild disc bulge. Facet and uncovertebral hypertrophy. No spinal canal stenosis. Mild left neural foraminal narrowing, new from the prior exam. C3-C4: Prior fusion. Left-greater-than-right facet arthropathy. No spinal canal stenosis. Mild left neural foraminal narrowing, new from the prior exam.  C4-C5: Prior fusion. No residual spinal canal stenosis. Facet arthropathy. Mild left neural foraminal narrowing, similar to prior. C5-C6: Status post fusion. Facet arthropathy. No residual spinal canal stenosis. Moderate right neural foraminal narrowing, unchanged. C6-C7: Status post fusion. Facet arthropathy. No spinal canal stenosis or neural foraminal narrowing. C7-T1: Anterolisthesis with disc unroofing. Facet arthropathy. No spinal canal stenosis. Moderate bilateral neural foraminal narrowing, unchanged. MRI THORACIC SPINE FINDINGS Alignment:  No significant listhesis. Vertebrae: Fluid in the disc space at T9-T10, which is  new compared to the 04/16/2022 exam. Some increased T2 signal is also noted in the endplates at T9-T10. Solid osseous fusion of T8 and T9. Redemonstrated posterior fusion extending from T10 through the lumbar spine, without definite solid arthrodesis. No evidence of acute fracture. Cord: Evaluation of the spinal cord from T9 through the lumbar spine is limited by susceptibility artifact from the hardware. Within this limitation, the visualized spinal cord is normal in signal and morphology. Possible epidural material at T9-T10 (series 12, image 28), which causes thecal sac narrowing. Paraspinal and other soft tissues: No acute finding. Postsurgical changes in the soft tissues posterior to T9-L4 Disc levels: Moderate thecal sac narrowing at T9-T10, in part secondary to suspected epidural material (series 12, images 27-29), which may represent phlegmon, although evaluation is limited by the absence of intravenous contrast. No other spinal canal stenosis is seen. Redemonstrated moderate right neural foraminal narrowing at T1-T2, mild bilateral neural foraminal narrowing at T3-T4, and mild left neural foraminal narrowing at T9-T10. Evaluation of the lower thoracic neural foramina is limited by susceptibility artifact, particularly on the left. IMPRESSION: CERVICAL SPINE: 1. C3-C7 ACDF, likely with solid arthrodesis. No residual spinal canal stenosis. Mild left neural foraminal narrowing at C3-C4 and C4-C5, new from the prior exam. 2. C5-C6 moderate right neural foraminal narrowing, unchanged. 3. C7-T1 moderate bilateral neural foraminal narrowing, unchanged. 4. Redemonstrated chronic myelomalacia in the anterior right spinal cord at C6-C7. THORACIC SPINE: 1. Fluid in the disc space at T9-T10, which is new compared to the 04/16/2022 exam, with some increased T2 signal in the endplates at T9-T10. These findings are concerning for discitis/osteomyelitis. 2. Moderate thecal sac narrowing at T9-T10, in part secondary to  suspected epidural material, which may represent phlegmon, although evaluation is limited by the absence of intravenous contrast. 3. Redemonstrated posterior fusion extending from T10 through the lumbar spine, without definite solid arthrodesis. 4. Redemonstrated moderate right neural foraminal narrowing at T1-T2, mild bilateral neural foraminal narrowing at T3-T4, and mild left neural foraminal narrowing at T9-T10. These results were called by telephone at the time of interpretation on 10/28/2023 at 7:22 pm to provider [facility] , who verbally acknowledged these results. Electronically Signed   By: Wiliam Ke M.D.   On: 10/28/2023 19:23   MR THORACIC SPINE WO CONTRAST Result Date: 10/28/2023 CLINICAL DATA:  Bilateral upper and lower extremity weakness and numbness EXAM: MRI CERVICAL AND THORACIC SPINE WITHOUT CONTRAST TECHNIQUE: Multiplanar and multiecho pulse sequences of the cervical spine, to include the craniocervical junction and cervicothoracic junction, and the thoracic spine, were obtained without intravenous contrast. COMPARISON:  04/16/2022 MRI cervical and thoracic spine FINDINGS: MRI CERVICAL SPINE FINDINGS Alignment: Straightening of the normal cervical lordosis. 3 mm anterolisthesis of C7 on T1, unchanged. Vertebrae: No acute fracture, evidence of discitis, or suspicious osseous lesion. ACDF C3-C7, likely with solid arthrodesis. Cord: Redemonstrated chronic myelomalacia in the anterior right spinal cord at C6-C7. Otherwise normal signal and morphology. Posterior Fossa, vertebral arteries, paraspinal tissues: No acute finding. Disc  levels: C2-C3: Mild disc bulge. Facet and uncovertebral hypertrophy. No spinal canal stenosis. Mild left neural foraminal narrowing, new from the prior exam. C3-C4: Prior fusion. Left-greater-than-right facet arthropathy. No spinal canal stenosis. Mild left neural foraminal narrowing, new from the prior exam. C4-C5: Prior fusion. No residual spinal canal stenosis.  Facet arthropathy. Mild left neural foraminal narrowing, similar to prior. C5-C6: Status post fusion. Facet arthropathy. No residual spinal canal stenosis. Moderate right neural foraminal narrowing, unchanged. C6-C7: Status post fusion. Facet arthropathy. No spinal canal stenosis or neural foraminal narrowing. C7-T1: Anterolisthesis with disc unroofing. Facet arthropathy. No spinal canal stenosis. Moderate bilateral neural foraminal narrowing, unchanged. MRI THORACIC SPINE FINDINGS Alignment:  No significant listhesis. Vertebrae: Fluid in the disc space at T9-T10, which is new compared to the 04/16/2022 exam. Some increased T2 signal is also noted in the endplates at T9-T10. Solid osseous fusion of T8 and T9. Redemonstrated posterior fusion extending from T10 through the lumbar spine, without definite solid arthrodesis. No evidence of acute fracture. Cord: Evaluation of the spinal cord from T9 through the lumbar spine is limited by susceptibility artifact from the hardware. Within this limitation, the visualized spinal cord is normal in signal and morphology. Possible epidural material at T9-T10 (series 12, image 28), which causes thecal sac narrowing. Paraspinal and other soft tissues: No acute finding. Postsurgical changes in the soft tissues posterior to T9-L4 Disc levels: Moderate thecal sac narrowing at T9-T10, in part secondary to suspected epidural material (series 12, images 27-29), which may represent phlegmon, although evaluation is limited by the absence of intravenous contrast. No other spinal canal stenosis is seen. Redemonstrated moderate right neural foraminal narrowing at T1-T2, mild bilateral neural foraminal narrowing at T3-T4, and mild left neural foraminal narrowing at T9-T10. Evaluation of the lower thoracic neural foramina is limited by susceptibility artifact, particularly on the left. IMPRESSION: CERVICAL SPINE: 1. C3-C7 ACDF, likely with solid arthrodesis. No residual spinal canal stenosis.  Mild left neural foraminal narrowing at C3-C4 and C4-C5, new from the prior exam. 2. C5-C6 moderate right neural foraminal narrowing, unchanged. 3. C7-T1 moderate bilateral neural foraminal narrowing, unchanged. 4. Redemonstrated chronic myelomalacia in the anterior right spinal cord at C6-C7. THORACIC SPINE: 1. Fluid in the disc space at T9-T10, which is new compared to the 04/16/2022 exam, with some increased T2 signal in the endplates at T9-T10. These findings are concerning for discitis/osteomyelitis. 2. Moderate thecal sac narrowing at T9-T10, in part secondary to suspected epidural material, which may represent phlegmon, although evaluation is limited by the absence of intravenous contrast. 3. Redemonstrated posterior fusion extending from T10 through the lumbar spine, without definite solid arthrodesis. 4. Redemonstrated moderate right neural foraminal narrowing at T1-T2, mild bilateral neural foraminal narrowing at T3-T4, and mild left neural foraminal narrowing at T9-T10. These results were called by telephone at the time of interpretation on 10/28/2023 at 7:22 pm to provider [facility] , who verbally acknowledged these results. Electronically Signed   By: Wiliam Ke M.D.   On: 10/28/2023 19:23    EKG: Independently reviewed.   Assessment and Plan: * Diskitis Pt with diskitis, phlegmon, and moderate thecal sac impingement at T9. 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  No other obvious sources this time around (ie open wounds, recent surgeries, etc). Potential organisms at the top of my suspect list include enterobacter cloacae (2023 culture) and MSSA (2021 culture). No SIRS or suggestion of  systemic involvement at this point. DW NSGY: Hold off on starting ABx for the moment to allow for best chance of culture positive Most likely plan will be IR aspiration for cultures I asked  given MRI L spine on 1/9: They do want repeat MRI L spine (with and without contrast) as well as T spine (with contrast, already has without today) to be ordered EDP also notified ID of pts admission I went ahead and put in for IR consult Keeping PT NPO after MN  Polypharmacy Caution with her multiple meds, will continue home meds for the moment.  Hypertension Cont atenolol and hydrochlorothiazide.      Advance Care Planning:   Code Status: Full Code  Consults: NSGY and ID  Family Communication: Family member on phone  Severity of Illness: The appropriate patient status for this patient is INPATIENT. Inpatient status is judged to be reasonable and necessary in order to provide the required intensity of service to ensure the patient's safety. The patient's presenting symptoms, physical exam findings, and initial radiographic and laboratory data in the context of their chronic comorbidities is felt to place them at high risk for further clinical deterioration. Furthermore, it is not anticipated that the patient will be medically stable for discharge from the hospital within 2 midnights of admission.   * I certify that at the point of admission it is my clinical judgment that the patient will require inpatient hospital care spanning beyond 2 midnights from the point of admission due to high intensity of service, high risk for further deterioration and high frequency of surveillance required.*  Author: Hillary Bow., DO 10/29/2023 12:12 AM  For on call review [email or web address].

## 2023-10-29 NOTE — Assessment & Plan Note (Signed)
Cont atenolol and hydrochlorothiazide.

## 2023-10-29 NOTE — Progress Notes (Addendum)
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  Subsequently she has been about a year and a half on Keflex before Dr. Drue Second decided to try her off of Keflex to see if there is any recurrence.  She went off of antibiotics about late May 2022 and did not have any recurrence of MSSA.  Subsequently she had surgical screw loosening back pain and ultimately he did surgery in June 2023.  This time is unfortunate complicated by postop wound infection sepsis and wound culture that grew out Enterobacter cloacae.  She is subsequently then started on Bactrim for a year and a half but that she thought she is having allergies and side effects given an acid medicine rash and headache was was itching so she elected to stop Bactrim in December 2024.  For last few weeks she has been having numbness in upper extremities and numbness and weakness in the bilateral extremities with no new bowel or bladder incontinence.  MRI of 1 9 of the L-spine did not show any findings of cord compression or infection then MRI in the ED on 10/28/2023 of the C and T-spine does show findings concerning for T9 discitis/osteomyelitis and T9 epidural phlegmon/abscess with moderate thecal compression.  Neurosurgery was consulted and recommended holding off starting antibiotics for the moment and ID was also consulted.  IR was also consulted for aspiration/biopsy and Dr. Tommie Sams approved the procedure for aspiration.  Assessment and Plan:  T9 Discitis with associated LE Weakness and Numbness -Pt with discitis, phlegmon, and moderate thecal sac impingement at T9. 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   -No other obvious sources this time around (ie open wounds, recent surgeries, etc). -Potential organisms at the top of my suspect list include enterobacter cloacae (2023 culture) and MSSA (2021 culture). -No SIRS or suggestion of systemic involvement at this point. -Dr. Julian Reil discussed with Neurosurgery who recommended holding off on starting ABx for the moment to allow for best chance of culture positive -Most likely plan will be IR aspiration/biopsy for cultures; IR consulted and Dr. Tommie Sams approved the procedure -Dr. Julian Reil asked given MRI L spine on 1/9: They do want repeat MRI L spine (with and without contrast) as well as T spine (with contrast, already has without today) to be ordered and it showed  -EDP also notified ID of pts admission and they evaluated; she has a known history of MSSA and Enterobacter Cloacea and did well on Bactrim suppression until recently when she came in with excess rashes on her elbows and was attributing this to Bactrim. -Keeping PT NPO after MN -Blood Cx x2 pending  -Continue with Celecoxib 200 mg po BID, Chlorzoxazone 500 mg po 4 times daily, and methocarbamol 500 mg po 4 times daily -C/w Gabapentin mg po TID -Inflammatory Markers are not elevated  -ID consulted and cultures obtained and plan on starting cefepime IV -Need PT OT to further evaluate and treat given her lower extremity weakness and numbness  Hyperglycemia  -Check HbA1c in the AM -If necessary will place on sensitive  NovoLog/scale insulin before meals and at bedtime   Polypharmacy -Will need Caution with her multiple meds, will continue home meds for the moment.   Hypertension -Continue Atenolol 50 mg po daily -Will hold Hydrochlorothiazide  -Continue to Monitor BP per Protocol -Last BP reading was 138/75  GERD/GI Prophylaxis -C/w PPI with  Pantoprazole 40 mg po daily   Class III Obesity -Complicates overall prognosis and care -Estimated body mass index is 40.97 kg/m as calculated from the following:   Height as of 09/23/23: 5\' 2"  (1.575 m).   Weight as of 09/23/23: 101.6 kg.  -Weight Loss and Dietary Counseling given   DVT prophylaxis: SCDs Start: 10/28/23 2338    Code Status: Full Code Family Communication: No family currently at bedside  Disposition Plan:  Level of care: Med-Surg Status is: Inpatient Remains inpatient appropriate because: Needs Further clinical workup and clearance by the specialist   Consultants:  Neurosurgery Infectious Diseases IR  Procedures:  As delineated as above   Antimicrobials:  Anti-infectives (From admission, onward)    None      Objective: Vitals:   10/29/23 0104 10/29/23 0315 10/29/23 0640 10/29/23 1141  BP:  116/87 125/76 138/75  Pulse:  75 85 74  Resp:  19 18 18   Temp: 97.8 F (36.6 C) 98.5 F (36.9 C) 98 F (36.7 C) (!) 97.5 F (36.4 C)  TempSrc: Oral Oral Oral   SpO2:  100% 98% 96%   No intake or output data in the 24 hours ending 10/29/23 1403 There were no vitals filed for this visit.  Data Reviewed: I have personally reviewed following labs and imaging studies  CBC: Recent Labs  Lab 10/28/23 1240 10/29/23 0518  WBC 8.0 8.1  NEUTROABS 4.8  --   HGB 13.8 12.9  HCT 46.3* 42.6  MCV 88.4 86.1  PLT 289 334   Basic Metabolic Panel: Recent Labs  Lab 10/28/23 1240 10/29/23 0518  NA 140 139  K 3.9 3.6  CL 104 101  CO2 28 25  GLUCOSE 104* 107*  BUN 14 15  CREATININE 0.64 0.58  CALCIUM 9.7 9.6   GFR: CrCl cannot be calculated (Unknown ideal weight.). Liver Function Tests: Recent Labs  Lab 10/28/23 1240  AST 25  ALT 24  ALKPHOS 124  BILITOT 0.3  PROT 6.3*  ALBUMIN 3.7   No results for input(s): "LIPASE", "AMYLASE" in the last 168 hours. No results for input(s): "AMMONIA" in the last 168 hours. Coagulation Profile: No results for  input(s): "INR", "PROTIME" in the last 168 hours. Cardiac Enzymes: No results for input(s): "CKTOTAL", "CKMB", "CKMBINDEX", "TROPONINI" in the last 168 hours. BNP (last 3 results) No results for input(s): "PROBNP" in the last 8760 hours. HbA1C: No results for input(s): "HGBA1C" in the last 72 hours. CBG: No results for input(s): "GLUCAP" in the last 168 hours. Lipid Profile: No results for input(s): "CHOL", "HDL", "LDLCALC", "TRIG", "CHOLHDL", "LDLDIRECT" in the last 72 hours. Thyroid Function Tests: No results for input(s): "TSH", "T4TOTAL", "FREET4", "T3FREE", "THYROIDAB" in the last 72 hours. Anemia Panel: No results for input(s): "VITAMINB12", "FOLATE", "FERRITIN", "TIBC", "IRON", "RETICCTPCT" in the last 72 hours. Sepsis Labs: Recent Labs  Lab 10/28/23 1306  LATICACIDVEN 1.0   Recent Results (from the past 240 hours)  Blood culture (routine x 2)     Status: None (Preliminary result)   Collection Time: 10/28/23  8:12 PM   Specimen: BLOOD  Result Value Ref Range Status   Specimen Description BLOOD LEFT ANTECUBITAL  Final  Special Requests   Final    BOTTLES DRAWN AEROBIC ONLY Blood Culture adequate volume   Culture   Final    NO GROWTH < 12 HOURS Performed at [facility], [address] [place], [name] [address]    Report Status PENDING  Incomplete  Blood culture (routine x 2)     Status: None (Preliminary result)   Collection Time: 10/28/23  8:24 PM   Specimen: BLOOD  Result Value Ref Range Status   Specimen Description BLOOD RIGHT ANTECUBITAL  Final   Special Requests   Final    BOTTLES DRAWN AEROBIC AND ANAEROBIC Blood Culture adequate volume   Culture   Final    NO GROWTH < 12 HOURS Performed at [facility], [address], [place], [name] [address]    Report Status PENDING  Incomplete    Radiology Studies: MR LUMBAR SPINE WO CONTRAST Result Date: 10/29/2023 CLINICAL DATA:  Initial evaluation for known spinal infection within the lower  thoracic spine, evaluate for lumbar spine involvement. EXAM: MRI LUMBAR SPINE WITHOUT CONTRAST TECHNIQUE: Multiplanar, multisequence MR imaging of the lumbar spine was performed. No intravenous contrast was administered. COMPARISON:  Prior study from 10/16/2023 FINDINGS: Segmentation: Standard. Lowest well-formed disc space labeled the L5-S1 level. Alignment: Levoscoliosis. Chronic grade 1 retrolisthesis of L1 on L2, with grade 1 anterolisthesis of L4 on L5, stable. Vertebrae: Extensive susceptibility artifact related to prior posterior fusion extending from T11 through L5. Prior ALIF at L5-S1. vertebral body height maintained without acute or interval fracture. Bone marrow signal intensity within normal limits. No evidence for acute infection within the lumbar spine. No worrisome osseous lesions. Conus medullaris and cauda equina: Conus extends to the L2 level. Conus and cauda equina are grossly unremarkable, though evaluation limited by artifact. Paraspinal and other soft tissues: Chronic postoperative scarring within the posterior paraspinous soft tissues. No acute finding. Disc levels: Underlying degenerative spondylosis with postoperative changes, stable as compared to recent MRI from 10/15/2018 fibroid these findings are fully described. No new abnormality. IMPRESSION: 1. No evidence for acute infection within the lumbar spine. 2. Postoperative changes related to prior posterior fusion extending from T11 through L5, with ALIF at L5-S1. Findings are stable as compared to recent MRI from 10/16/2023. 3. Underlying degenerative spondylosis, stable as compared to recent MRI from 10/16/2023 where these changes are fully described. Electronically Signed   By: Rise Mu M.D.   On: 10/29/2023 03:28   MR Cervical Spine Wo Contrast Result Date: 10/28/2023 CLINICAL DATA:  Bilateral upper and lower extremity weakness and numbness EXAM: MRI CERVICAL AND THORACIC SPINE WITHOUT CONTRAST TECHNIQUE: Multiplanar  and multiecho pulse sequences of the cervical spine, to include the craniocervical junction and cervicothoracic junction, and the thoracic spine, were obtained without intravenous contrast. COMPARISON:  04/16/2022 MRI cervical and thoracic spine FINDINGS: MRI CERVICAL SPINE FINDINGS Alignment: Straightening of the normal cervical lordosis. 3 mm anterolisthesis of C7 on T1, unchanged. Vertebrae: No acute fracture, evidence of discitis, or suspicious osseous lesion. ACDF C3-C7, likely with solid arthrodesis. Cord: Redemonstrated chronic myelomalacia in the anterior right spinal cord at C6-C7. Otherwise normal signal and morphology. Posterior Fossa, vertebral arteries, paraspinal tissues: No acute finding. Disc levels: C2-C3: Mild disc bulge. Facet and uncovertebral hypertrophy. No spinal canal stenosis. Mild left neural foraminal narrowing, new from the prior exam. C3-C4: Prior fusion. Left-greater-than-right facet arthropathy. No spinal canal stenosis. Mild left neural foraminal narrowing, new from the prior exam. C4-C5: Prior fusion. No residual spinal canal stenosis. Facet  arthropathy. Mild left neural foraminal narrowing, similar to prior. C5-C6: Status post fusion. Facet arthropathy. No residual spinal canal stenosis. Moderate right neural foraminal narrowing, unchanged. C6-C7: Status post fusion. Facet arthropathy. No spinal canal stenosis or neural foraminal narrowing. C7-T1: Anterolisthesis with disc unroofing. Facet arthropathy. No spinal canal stenosis. Moderate bilateral neural foraminal narrowing, unchanged. MRI THORACIC SPINE FINDINGS Alignment:  No significant listhesis. Vertebrae: Fluid in the disc space at T9-T10, which is new compared to the 04/16/2022 exam. Some increased T2 signal is also noted in the endplates at T9-T10. Solid osseous fusion of T8 and T9. Redemonstrated posterior fusion extending from T10 through the lumbar spine, without definite solid arthrodesis. No evidence of acute fracture.  Cord: Evaluation of the spinal cord from T9 through the lumbar spine is limited by susceptibility artifact from the hardware. Within this limitation, the visualized spinal cord is normal in signal and morphology. Possible epidural material at T9-T10 (series 12, image 28), which causes thecal sac narrowing. Paraspinal and other soft tissues: No acute finding. Postsurgical changes in the soft tissues posterior to T9-L4 Disc levels: Moderate thecal sac narrowing at T9-T10, in part secondary to suspected epidural material (series 12, images 27-29), which may represent phlegmon, although evaluation is limited by the absence of intravenous contrast. No other spinal canal stenosis is seen. Redemonstrated moderate right neural foraminal narrowing at T1-T2, mild bilateral neural foraminal narrowing at T3-T4, and mild left neural foraminal narrowing at T9-T10. Evaluation of the lower thoracic neural foramina is limited by susceptibility artifact, particularly on the left. IMPRESSION: CERVICAL SPINE: 1. C3-C7 ACDF, likely with solid arthrodesis. No residual spinal canal stenosis. Mild left neural foraminal narrowing at C3-C4 and C4-C5, new from the prior exam. 2. C5-C6 moderate right neural foraminal narrowing, unchanged. 3. C7-T1 moderate bilateral neural foraminal narrowing, unchanged. 4. Redemonstrated chronic myelomalacia in the anterior right spinal cord at C6-C7. THORACIC SPINE: 1. Fluid in the disc space at T9-T10, which is new compared to the 04/16/2022 exam, with some increased T2 signal in the endplates at T9-T10. These findings are concerning for discitis/osteomyelitis. 2. Moderate thecal sac narrowing at T9-T10, in part secondary to suspected epidural material, which may represent phlegmon, although evaluation is limited by the absence of intravenous contrast. 3. Redemonstrated posterior fusion extending from T10 through the lumbar spine, without definite solid arthrodesis. 4. Redemonstrated moderate right neural  foraminal narrowing at T1-T2, mild bilateral neural foraminal narrowing at T3-T4, and mild left neural foraminal narrowing at T9-T10. These results were called by telephone at the time of interpretation on 10/28/2023 at 7:22 pm to provider [facility] , who verbally acknowledged these results. Electronically Signed   By: Wiliam Ke M.D.   On: 10/28/2023 19:23   MR THORACIC SPINE WO CONTRAST Result Date: 10/28/2023 CLINICAL DATA:  Bilateral upper and lower extremity weakness and numbness EXAM: MRI CERVICAL AND THORACIC SPINE WITHOUT CONTRAST TECHNIQUE: Multiplanar and multiecho pulse sequences of the cervical spine, to include the craniocervical junction and cervicothoracic junction, and the thoracic spine, were obtained without intravenous contrast. COMPARISON:  04/16/2022 MRI cervical and thoracic spine FINDINGS: MRI CERVICAL SPINE FINDINGS Alignment: Straightening of the normal cervical lordosis. 3 mm anterolisthesis of C7 on T1, unchanged. Vertebrae: No acute fracture, evidence of discitis, or suspicious osseous lesion. ACDF C3-C7, likely with solid arthrodesis. Cord: Redemonstrated chronic myelomalacia in the anterior right spinal cord at C6-C7. Otherwise normal signal and morphology. Posterior Fossa, vertebral arteries, paraspinal tissues: No acute finding. Disc levels: C2-C3: Mild disc bulge. Facet and uncovertebral hypertrophy.  No spinal canal stenosis. Mild left neural foraminal narrowing, new from the prior exam. C3-C4: Prior fusion. Left-greater-than-right facet arthropathy. No spinal canal stenosis. Mild left neural foraminal narrowing, new from the prior exam. C4-C5: Prior fusion. No residual spinal canal stenosis. Facet arthropathy. Mild left neural foraminal narrowing, similar to prior. C5-C6: Status post fusion. Facet arthropathy. No residual spinal canal stenosis. Moderate right neural foraminal narrowing, unchanged. C6-C7: Status post fusion. Facet arthropathy. No spinal canal stenosis or  neural foraminal narrowing. C7-T1: Anterolisthesis with disc unroofing. Facet arthropathy. No spinal canal stenosis. Moderate bilateral neural foraminal narrowing, unchanged. MRI THORACIC SPINE FINDINGS Alignment:  No significant listhesis. Vertebrae: Fluid in the disc space at T9-T10, which is new compared to the 04/16/2022 exam. Some increased T2 signal is also noted in the endplates at T9-T10. Solid osseous fusion of T8 and T9. Redemonstrated posterior fusion extending from T10 through the lumbar spine, without definite solid arthrodesis. No evidence of acute fracture. Cord: Evaluation of the spinal cord from T9 through the lumbar spine is limited by susceptibility artifact from the hardware. Within this limitation, the visualized spinal cord is normal in signal and morphology. Possible epidural material at T9-T10 (series 12, image 28), which causes thecal sac narrowing. Paraspinal and other soft tissues: No acute finding. Postsurgical changes in the soft tissues posterior to T9-L4 Disc levels: Moderate thecal sac narrowing at T9-T10, in part secondary to suspected epidural material (series 12, images 27-29), which may represent phlegmon, although evaluation is limited by the absence of intravenous contrast. No other spinal canal stenosis is seen. Redemonstrated moderate right neural foraminal narrowing at T1-T2, mild bilateral neural foraminal narrowing at T3-T4, and mild left neural foraminal narrowing at T9-T10. Evaluation of the lower thoracic neural foramina is limited by susceptibility artifact, particularly on the left. IMPRESSION: CERVICAL SPINE: 1. C3-C7 ACDF, likely with solid arthrodesis. No residual spinal canal stenosis. Mild left neural foraminal narrowing at C3-C4 and C4-C5, new from the prior exam. 2. C5-C6 moderate right neural foraminal narrowing, unchanged. 3. C7-T1 moderate bilateral neural foraminal narrowing, unchanged. 4. Redemonstrated chronic myelomalacia in the anterior right spinal cord  at C6-C7. THORACIC SPINE: 1. Fluid in the disc space at T9-T10, which is new compared to the 04/16/2022 exam, with some increased T2 signal in the endplates at T9-T10. These findings are concerning for discitis/osteomyelitis. 2. Moderate thecal sac narrowing at T9-T10, in part secondary to suspected epidural material, which may represent phlegmon, although evaluation is limited by the absence of intravenous contrast. 3. Redemonstrated posterior fusion extending from T10 through the lumbar spine, without definite solid arthrodesis. 4. Redemonstrated moderate right neural foraminal narrowing at T1-T2, mild bilateral neural foraminal narrowing at T3-T4, and mild left neural foraminal narrowing at T9-T10. These results were called by telephone at the time of interpretation on 10/28/2023 at 7:22 pm to provider [facility] , who verbally acknowledged these results. Electronically Signed   By: Wiliam Ke M.D.   On: 10/28/2023 19:23   Scheduled Meds:  aspirin EC  81 mg Oral q AM   atenolol  50 mg Oral Daily   celecoxib  200 mg Oral BID   chlorzoxazone  500 mg Oral QID   cyclobenzaprine  10 mg Oral TID   diazepam  5 mg Oral QHS   diphenhydrAMINE  50 mg Oral QID   ferrous sulfate  325 mg Oral q AM   gabapentin  1,200 mg Oral TID   lubiprostone  48 mcg Oral QAC lunch   methocarbamol  500 mg  Oral QID   multivitamin with minerals  1 tablet Oral q AM   pantoprazole  40 mg Oral Daily   Continuous Infusions:   LOS: 1 day   Marguerita Merles, DO [facility] Available via Epic secure chat 7am-7pm After these hours, please refer to coverage provider listed on [email or web address] 10/29/2023, 2:03 PM

## 2023-10-29 NOTE — Assessment & Plan Note (Signed)
Pt with diskitis, phlegmon, and moderate thecal sac impingement at T9. 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  No other obvious sources this time around (ie open wounds, recent surgeries, etc). Potential organisms at the top of my suspect list include enterobacter cloacae (2023 culture) and MSSA (2021 culture). No SIRS or suggestion of systemic involvement at this point. DW NSGY: Hold off on starting ABx for the moment to allow for best chance of culture positive Most likely plan will be IR aspiration for cultures I asked given MRI L spine on 1/9: They do want repeat MRI L spine (with and without contrast) as well as T spine (with contrast, already has without today) to be ordered EDP also notified ID of pts admission I went ahead and put in for IR consult Keeping PT NPO after MN

## 2023-10-29 NOTE — ED Notes (Signed)
Patient transported to IR 

## 2023-10-29 NOTE — Consult Note (Signed)
[facility]    Date of Admission:  10/28/2023     Total days of antibiotics 0         Reason for Consult: T9 Discitis     Referring Provider: Marland [name] Primary Care Provider: Irven Coe, MD   Assessment: [name] is a 63 y.o. female admitted to the hospital for evaluation of:   LE Weakness, Numbness -  Fluid at Disc T9-10 (New Vs. 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 No MSSA since 2021. She seemed to do well on bactrim for suppression until recently when she came with eczematous rashes on elbows and attributed it to bactrim.  She is clinically stable and without any systemic symptoms of infection. Will hold off abx since this appears to be a new site in the thoracic spine. She has no findings of any recurrence in L spine.  Await [name] and IR team's evaluation to determine next steps.  -Hold abx -FU IR/NSGY plan for further microbiologic diagnosis and if any surgery is needed re: spinal cord impingement.    Medication Monitoring -  CRP normal  ESR normal    Plan: Hold off abx  Follow up plan for cultures to help guide antibiotic recs  Follow up NSGY recommendations if surgery needed     Principal Problem:   Diskitis Active Problems:   Hypertension   Polypharmacy    aspirin EC  81 mg Oral q AM   atenolol  50 mg Oral Daily   celecoxib  200 mg Oral BID   chlorzoxazone  500 mg Oral QID   cyclobenzaprine  10 mg Oral TID   diazepam  5 mg Oral QHS   diphenhydrAMINE  50 mg Oral QID   ferrous sulfate  325 mg Oral q AM   gabapentin  1,200 mg Oral TID   hydrochlorothiazide  12.5 mg Oral Daily   lubiprostone  48 mcg Oral QAC lunch   methocarbamol  500 mg Oral QID   multivitamin with minerals  1 tablet Oral q AM   pantoprazole  40 mg Oral Daily    HPI: [name] is a 63 y.o. female awaiting admission for  progressive lower extremity numbness and inability to move her legs.   She has a significant history of vertebral infection involving hardware since 2021 (MSSA) for which she was on abx through May 2022. Eventually in June 2023 she had a surgical screw that loosened resulting in increased back pain - this was complicated by post op wound infection due to enterobacter cloacae. She had been maintained on bactrim suppression for about 1.5 years and at follow up with my partner [name] in December she was worried she was having side effects to the bactrim given she has a remote h/o sulfa allergy (headaches, rashes to skin) and decided to stop taking it. [name] states that about 3-4 weeks after stopping she has had progressive problems with LE weakness and tingling. This has been escalating. She has had appointments with ortho and dr Franky [name] arranged for evaluation and outpatient MRIs but woke up this week and unable to move legs. Called EMS to help get her up off the floor. States her "legs just would not work."  MRI L spine 1/9 was unremarkable for cause beyond djd.   She has not been sick or  ill with any systemic symptoms. No fevers/chills or sweating.  MRI 1/21 reveals Discitis, phlegmon and impingement at T9. Externally there is nothing remarkable about the wound.  She has not received any antibiotics since she stopped in mid December.  NSGY and IR have been consulted. We have been asked to help facilitate work up as well given her significant history of infections in the past.   Review of Systems: Review of Systems  Constitutional:  Negative for chills, fever and malaise/fatigue.  Respiratory: Negative.    Cardiovascular: Negative.   Musculoskeletal:  Positive for back pain.  Neurological:  Positive for tingling and focal weakness.     Past Medical History:  Diagnosis Date   Anemia    Ankle syndesmosis disruption    Anxiety    takes Ativan and Valium, after mother passed   Arthritis     bilateral knees s/p knee replacement bilaterally   Asthma    Bronchitis    Bruising    pt states unexplained d/t fibromyalgia   Chronic back pain    2012 tailbone surgery and 3 lower discs.    Closed fracture of distal end of right fibula and tibia    Depression    from Fibromyalgia diagnosis; not taking medicine. since 2001   Dizziness    rarely   Fibromyalgia    diagnosed 2001   GERD (gastroesophageal reflux disease)    Prilosec occasionally   Headache(784.0)    "sinus headaches"   History of hiatal hernia    Hypertension    since 2013   Hyperthyroidism    subclinical, no treatment; thyroid nodules   IBS (irritable bowel syndrome)    Impaired memory    states from fibromyalgia   Insomnia    takes Ambien   Jones fracture    left foot fifth metatarsal   Multiple allergies    including latex, pet dander, shellfish, pet dander   Painful orthopaedic hardware right ankle 05/27/2016   Seasonal allergies    Shortness of breath    Occasional with exertion;    Sore gums    this is why pt is on Amoxil-only takes for dental work   Tachycardia    Thyroid goiter    Varicose vein    protrudes above skin-per pt;vein popped and bruised;ultrasound done to make sure that there were no clots;noclots were found    Social History   Tobacco Use   Smoking status: Former    Current packs/day: 0.00    Average packs/day: 0.5 packs/day for 10.0 years (5.0 ttl pk-yrs)    Types: Cigarettes    Start date: 12/05/1988    Quit date: 12/06/1998    Years since quitting: 24.9   Smokeless tobacco: Never  Vaping Use   Vaping status: Never Used  Substance Use Topics   Alcohol use: No   Drug use: No    Family History  Problem Relation Age of Onset   Stroke Father        passed 2004 from pneumonia   Alcohol abuse Father    Pulmonary embolism Mother        after minor knee surgery leading to DVT   Arthritis Sister    Depression Sister    Hypertension Sister    Diabetes Other         grandmother   Anesthesia problems Neg Hx    Hypotension Neg Hx    Malignant hyperthermia Neg Hx    Pseudochol deficiency Neg Hx    Allergies  Allergen  Reactions   Monosodium Glutamate Anaphylaxis and Swelling    Eyes swollen shut, facial swelling, tongue swelling.   Shellfish Allergy Anaphylaxis   Baclofen Nausea Only    Dizziness and increase muscle spasm   Celebrex [Celecoxib] Itching    Only allergic to generic brand   Contrast Media [Iodinated Contrast Media] Itching and Nausea Only    "could not walk"   Diclofenac Itching    Generic Diclefenac gel causes itching. Can take the name brand Voltaren gel   Gadolinium     Allergic to MRI contrast dye per patient.   Other Other (See Comments)    Pet dander,   Green rubber piece to hold mouth open (oval shape)     Sulfa Antibiotics Other (See Comments)    Migraines   Latex Itching and Rash    Latex glove with powder    OBJECTIVE: Blood pressure 125/76, pulse 85, temperature 98 F (36.7 C), temperature source Oral, resp. rate 18, last menstrual period 11/20/2012, SpO2 98%.  Physical Exam Vitals reviewed.  HENT:     Mouth/Throat:     Mouth: No oral lesions.     Dentition: No dental abscesses.  Cardiovascular:     Rate and Rhythm: Normal rate and regular rhythm.     Heart sounds: Normal heart sounds.  Pulmonary:     Effort: Pulmonary effort is normal.     Breath sounds: Normal breath sounds.  Abdominal:     General: There is no distension.     Palpations: Abdomen is soft.     Tenderness: There is no abdominal tenderness.  Lymphadenopathy:     Cervical: No cervical adenopathy.  Skin:    General: Skin is warm and dry.     Findings: No rash.  Neurological:     Mental Status: She is alert and oriented to person, place, and time.  Psychiatric:        Judgment: Judgment normal.     Lab Results Lab Results  Component Value Date   WBC 8.1 10/29/2023   HGB 12.9 10/29/2023   HCT 42.6 10/29/2023   MCV 86.1  10/29/2023   PLT 334 10/29/2023    Lab Results  Component Value Date   CREATININE 0.58 10/29/2023   BUN 15 10/29/2023   NA 139 10/29/2023   K 3.6 10/29/2023   CL 101 10/29/2023   CO2 25 10/29/2023    Lab Results  Component Value Date   ALT 24 10/28/2023   AST 25 10/28/2023   ALKPHOS 124 10/28/2023   BILITOT 0.3 10/28/2023     Microbiology: Recent Results (from the past 240 hours)  Blood culture (routine x 2)     Status: None (Preliminary result)   Collection Time: 10/28/23  8:12 PM   Specimen: BLOOD  Result Value Ref Range Status   Specimen Description BLOOD LEFT ANTECUBITAL  Final   Special Requests   Final    BOTTLES DRAWN AEROBIC ONLY Blood Culture adequate volume   Culture   Final    NO GROWTH < 12 HOURS Performed at [facility], [address] [place], [name] [address]    Report Status PENDING  Incomplete  Blood culture (routine x 2)     Status: None (Preliminary result)   Collection Time: 10/28/23  8:24 PM   Specimen: BLOOD  Result Value Ref Range Status   Specimen Description BLOOD RIGHT ANTECUBITAL  Final   Special Requests   Final    BOTTLES DRAWN AEROBIC AND ANAEROBIC Blood  Culture adequate volume   Culture   Final    NO GROWTH < 12 HOURS Performed at [facility], [address] [place], [name] [address]    Report Status PENDING  Incomplete    Rexene Alberts, MSN, NP-C [facility] Pager: [phone]  10/29/2023 11:01 AM

## 2023-10-30 DIAGNOSIS — M4644 Discitis, unspecified, thoracic region: Secondary | ICD-10-CM | POA: Diagnosis not present

## 2023-10-30 DIAGNOSIS — Z79899 Other long term (current) drug therapy: Secondary | ICD-10-CM | POA: Diagnosis not present

## 2023-10-30 DIAGNOSIS — I10 Essential (primary) hypertension: Secondary | ICD-10-CM | POA: Diagnosis not present

## 2023-10-30 LAB — COMPREHENSIVE METABOLIC PANEL
ALT: 21 U/L (ref 0–44)
AST: 19 U/L (ref 15–41)
Albumin: 3.2 g/dL — ABNORMAL LOW (ref 3.5–5.0)
Alkaline Phosphatase: 103 U/L (ref 38–126)
Anion gap: 9 (ref 5–15)
BUN: 18 mg/dL (ref 8–23)
CO2: 27 mmol/L (ref 22–32)
Calcium: 8.6 mg/dL — ABNORMAL LOW (ref 8.9–10.3)
Chloride: 97 mmol/L — ABNORMAL LOW (ref 98–111)
Creatinine, Ser: 0.65 mg/dL (ref 0.44–1.00)
GFR, Estimated: >60 mL/min (ref >60)
Glucose, Bld: 91 mg/dL (ref 70–99)
Potassium: 3.6 mmol/L (ref 3.5–5.1)
Sodium: 133 mmol/L — ABNORMAL LOW (ref 135–145)
Total Bilirubin: 0.4 mg/dL (ref 0.0–1.2)
Total Protein: 5.6 g/dL — ABNORMAL LOW (ref 6.5–8.1)

## 2023-10-30 LAB — CBC WITH DIFFERENTIAL/PLATELET
Abs Immature Granulocytes: 0 10*3/uL (ref 0.00–0.07)
Basophils Absolute: 0.1 10*3/uL (ref 0.0–0.1)
Basophils Relative: 1 %
Eosinophils Absolute: 0.4 10*3/uL (ref 0.0–0.5)
Eosinophils Relative: 6 %
HCT: 40.2 % (ref 36.0–46.0)
Hemoglobin: 12.4 g/dL (ref 12.0–15.0)
Immature Granulocytes: 0 %
Lymphocytes Relative: 29 %
Lymphs Abs: 1.7 10*3/uL (ref 0.7–4.0)
MCH: 26.3 pg (ref 26.0–34.0)
MCHC: 30.8 g/dL (ref 30.0–36.0)
MCV: 85.2 fL (ref 80.0–100.0)
Monocytes Absolute: 0.6 10*3/uL (ref 0.1–1.0)
Monocytes Relative: 10 %
Neutro Abs: 3.2 10*3/uL (ref 1.7–7.7)
Neutrophils Relative %: 54 %
Platelets: 293 10*3/uL (ref 150–400)
RBC: 4.72 MIL/uL (ref 3.87–5.11)
RDW: 12.8 % (ref 11.5–15.5)
WBC: 6 10*3/uL (ref 4.0–10.5)
nRBC: 0 % (ref 0.0–0.2)

## 2023-10-30 LAB — PHOSPHORUS: Phosphorus: 4.7 mg/dL — ABNORMAL HIGH (ref 2.5–4.6)

## 2023-10-30 LAB — MAGNESIUM: Magnesium: 2 mg/dL (ref 1.7–2.4)

## 2023-10-30 NOTE — Progress Notes (Signed)
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  Subsequently she has been about a year and a half on Keflex before Dr. Drue Second decided to try her off of Keflex to see if there is any recurrence.  She went off of antibiotics about late May 2022 and did not have any recurrence of MSSA.  Subsequently she had surgical screw loosening back pain and ultimately he did surgery in June 2023.  This time is unfortunate complicated by postop wound infection sepsis and wound culture that grew out Enterobacter cloacae.  She is subsequently then started on Bactrim for a year and a half but that she thought she is having allergies and side effects given an acid medicine rash and headache was was itching so she elected to stop Bactrim in December 2024.  For last few weeks she has been having numbness in upper extremities and numbness and weakness in the bilateral extremities with no new bowel or bladder incontinence.  MRI of 1 9 of the L-spine did not show any findings of cord compression or infection then MRI in the ED on 10/28/2023 of the C and T-spine does show findings concerning for T9 discitis/osteomyelitis and T9 epidural phlegmon/abscess with moderate thecal compression.  Neurosurgery was consulted and recommended holding off starting antibiotics for the moment and ID was also consulted.  IR was also consulted for aspiration/biopsy and Dr. Tommie Sams approved the procedure for aspiration.  Aspiration done and Cx showing NGTD. Now ID started the patient on IV Cefepime.   Assessment and Plan:  T9 Discitis with associated LE Weakness and Numbness -Pt with  discitis, phlegmon, and moderate thecal sac impingement at T9. -Suspicious that source may be recurrence of a suppressed chronic spinal hardware infection in setting of pt taking herself off on bactrim in Dec.   -No other obvious sources this time around (ie open wounds, recent surgeries, etc). -Potential organisms at the top of my suspect list include enterobacter cloacae (2023 culture) and MSSA (2021 culture). -No SIRS or suggestion of systemic involvement at this point. -Dr. Julian Reil discussed with Neurosurgery who recommended holding off on starting ABx for the moment to allow for best chance of culture positive -Most likely plan will be IR aspiration/biopsy for cultures; IR consulted and Dr. Tommie Sams approved the procedure -Dr. Julian Reil asked given MRI L spine on 1/9: They do want repeat MRI L spine (with and without contrast) as well as T spine (with contrast, already has without today) to be ordered and it showed  -EDP also notified ID of pts admission and they evaluated; she has a known history of MSSA and Enterobacter Cloacea and did well on Bactrim suppression until recently when she came in with excess rashes on her elbows and was attributing this to Bactrim. -Keeping PT NPO after MN -Blood Cx x2 show NGTD <12 hours   -Continue with Celecoxib 200 mg po BID, Chlorzoxazone 500 mg po 4 times daily, and methocarbamol 500 mg po 4 times daily -C/w Gabapentin mg po TID -Inflammatory Markers are not elevated  -ID consulted and cultures obtained and plan on starting cefepime IV after aspiration; Now on Day 2 and per ID  they are planning on a prolonged course with 6 weeks of IV antibiotics with IV cefepime and there is no positive growth -Current plan is for a PICC line tomorrow if blood cultures remain negative -Need PT OT to further evaluate and treat given her lower extremity weakness and numbness  Hyperglycemia  -Check HbA1c in the AM; Last HbA1c was 6.0 in our system -If necessary  will place on sensitive NovoLog/scale insulin before meals and at bedtime -Glucose Level Trend:  Recent Labs  Lab 10/16/23 1739 10/28/23 1240 10/29/23 0518 10/30/23 0508  GLUCOSE 109* 104* 107* 91    Polypharmacy -Will need Caution with her multiple meds, will continue home meds for the moment.  Hyponatremia -Na+ Trend: Recent Labs  Lab 10/16/23 1739 10/28/23 1240 10/29/23 0518 10/30/23 0508  NA 141 140 139 133*  -Continue to Monitor and Trend and repeat CMP in the AM   Hypertension -Continue Atenolol 50 mg po daily -Will hold Hydrochlorothiazide  -Continue to Monitor BP per Protocol -Last BP reading was 136/66  GERD/GI Prophylaxis -C/w PPI with Pantoprazole 40 mg po daily   Hypoalbuminemia -Patient's Albumin Trend: Recent Labs  Lab 10/28/23 1240 10/30/23 0508  ALBUMIN 3.7 3.2*  -Continue to Monitor and Trend and repeat CMP in the AM'  Class III Obesity (Morbid) -Complicates overall prognosis and care -Estimated body mass index is 40.97 kg/m as calculated from the following:   Height as of 09/23/23: 5\' 2"  (1.575 m).   Weight as of this encounter: 101.6 kg.  -Weight Loss and Dietary Counseling given   DVT prophylaxis: SCDs Start: 10/28/23 2338    Code Status: Full Code Family Communication: No family present at bedside   Disposition Plan:  Level of care: Med-Surg Status is: Inpatient Remains inpatient appropriate because: Needs further Clinical improvement   Consultants:  Neurosurgery Infectious Diseases IR  Procedures:  As delineated as above  Antimicrobials:  Anti-infectives (From admission, onward)    Start     Dose/Rate Route Frequency Ordered Stop   10/29/23 1700  ceFEPIme (MAXIPIME) 2 g in sodium chloride 0.9 % 100 mL IVPB        2 g 200 mL/hr over 30 Minutes Intravenous Every 8 hours 10/29/23 1603         Subjective: Seen and examined at bedside and was complaining of buttock pain today and wanting to get out of the bed.  No nausea  or vomiting.  Feels okay today.  No other concerns or complaints at this time.  Objective: Vitals:   10/29/23 1931 10/30/23 0224 10/30/23 0650 10/30/23 0730  BP: (!) 120/98 103/60 124/61 136/66  Pulse: 73 78 84 72  Resp:      Temp: 98 F (36.7 C) 98.7 F (37.1 C) 98.3 F (36.8 C) 97.8 F (36.6 C)  TempSrc: Oral Oral Oral Oral  SpO2: 97% 96% 99% 96%  Weight:        Intake/Output Summary (Last 24 hours) at 10/30/2023 1446 Last data filed at 10/30/2023 0217 Gross per 24 hour  Intake 200 ml  Output 400 ml  Net -200 ml   Filed Weights   10/29/23 1455  Weight: 101.6 kg   Examination: Physical Exam:  Constitutional: WN/WD morbidly obese female in no acute distress appears calm Respiratory: Diminished to auscultation bilaterally, no wheezing, rales, rhonchi or crackles. Normal respiratory effort and patient is not tachypenic. No accessory muscle use.  Unlabored breathing Cardiovascular: RRR, no murmurs / rubs / gallops. S1 and S2 auscultated. No extremity edema. Abdomen:  Soft, non-tender, distended secondary to body habitus. Bowel sounds positive.  GU: Deferred. Musculoskeletal: No clubbing / cyanosis of digits/nails. No joint deformity upper and lower extremities. Skin: No rashes, lesions, ulcers on limited skin evaluation. No induration; Warm and dry.  Neurologic: CN 2-12 grossly intact with no focal deficits. Romberg sign and cerebellar reflexes not assessed.  Psychiatric: Normal judgment and insight. Alert and oriented x 3. Normal mood and appropriate affect.   Data Reviewed: I have personally reviewed following labs and imaging studies  CBC: Recent Labs  Lab 10/28/23 1240 10/29/23 0518 10/30/23 0508  WBC 8.0 8.1 6.0  NEUTROABS 4.8  --  3.2  HGB 13.8 12.9 12.4  HCT 46.3* 42.6 40.2  MCV 88.4 86.1 85.2  PLT 289 334 293   Basic Metabolic Panel: Recent Labs  Lab 10/28/23 1240 10/29/23 0518 10/30/23 0508  NA 140 139 133*  K 3.9 3.6 3.6  CL 104 101 97*  CO2 28 25  27   GLUCOSE 104* 107* 91  BUN 14 15 18   CREATININE 0.64 0.58 0.65  CALCIUM 9.7 9.6 8.6*  MG  --   --  2.0  PHOS  --   --  4.7*   GFR: Estimated Creatinine Clearance: 82.4 mL/min (by C-G formula based on SCr of 0.65 mg/dL). Liver Function Tests: Recent Labs  Lab 10/28/23 1240 10/30/23 0508  AST 25 19  ALT 24 21  ALKPHOS 124 103  BILITOT 0.3 0.4  PROT 6.3* 5.6*  ALBUMIN 3.7 3.2*   No results for input(s): "LIPASE", "AMYLASE" in the last 168 hours. No results for input(s): "AMMONIA" in the last 168 hours. Coagulation Profile: No results for input(s): "INR", "PROTIME" in the last 168 hours. Cardiac Enzymes: No results for input(s): "CKTOTAL", "CKMB", "CKMBINDEX", "TROPONINI" in the last 168 hours. BNP (last 3 results) No results for input(s): "PROBNP" in the last 8760 hours. HbA1C: No results for input(s): "HGBA1C" in the last 72 hours. CBG: No results for input(s): "GLUCAP" in the last 168 hours. Lipid Profile: No results for input(s): "CHOL", "HDL", "LDLCALC", "TRIG", "CHOLHDL", "LDLDIRECT" in the last 72 hours. Thyroid Function Tests: No results for input(s): "TSH", "T4TOTAL", "FREET4", "T3FREE", "THYROIDAB" in the last 72 hours. Anemia Panel: No results for input(s): "VITAMINB12", "FOLATE", "FERRITIN", "TIBC", "IRON", "RETICCTPCT" in the last 72 hours. Sepsis Labs: Recent Labs  Lab 10/28/23 1306  LATICACIDVEN 1.0   Recent Results (from the past 240 hours)  Blood culture (routine x 2)     Status: None (Preliminary result)   Collection Time: 10/28/23  8:12 PM   Specimen: BLOOD  Result Value Ref Range Status   Specimen Description BLOOD LEFT ANTECUBITAL  Final   Special Requests   Final    BOTTLES DRAWN AEROBIC ONLY Blood Culture adequate volume   Culture   Final    NO GROWTH < 12 HOURS Performed at [facility], [address] [place], [name] [address]    Report Status PENDING  Incomplete  Blood culture (routine x 2)     Status: None (Preliminary  result)   Collection Time: 10/28/23  8:24 PM   Specimen: BLOOD  Result Value Ref Range Status   Specimen Description BLOOD RIGHT ANTECUBITAL  Final   Special Requests   Final    BOTTLES DRAWN AEROBIC AND ANAEROBIC Blood Culture adequate volume   Culture   Final    NO GROWTH < 12 HOURS Performed at [facility], [address] [place], [name] [address]  Report Status PENDING  Incomplete  Aerobic/Anaerobic Culture w Gram Stain (surgical/deep wound)     Status: None (Preliminary result)   Collection Time: 10/29/23  3:45 PM   Specimen: Fine Needle Aspirate  Result Value Ref Range Status   Specimen Description NEEDLE ASPIRATE  Final   Special Requests INTERVERTEBRAL DISC  Final   Gram Stain NO WBC SEEN NO ORGANISMS SEEN   Final   Culture   Final    NO GROWTH < 24 HOURS Performed at [facility], [address] [place], [name] [address]    Report Status PENDING  Incomplete  Aerobic/Anaerobic Culture w Gram Stain (surgical/deep wound)     Status: None (Preliminary result)   Collection Time: 10/29/23  3:45 PM   Specimen: NEEDLE ASPIRATE; Abscess  Result Value Ref Range Status   Specimen Description NEEDLE ASPIRATE  Final   Special Requests INTERVERTEBRAL DISC  Final   Gram Stain NO WBC SEEN NO ORGANISMS SEEN   Final   Culture   Final    NO GROWTH < 24 HOURS Performed at [facility], [address] [place], [name] [address]    Report Status PENDING  Incomplete    Radiology Studies: IR THORACIC DISC ASPIRATION W/IMG GUIDE Result Date: 10/29/2023 INDICATION: T9-10 discitis/osteomyelitis. EXAM: FLUOROSCOPY GUIDED T9-10 INTERVERTEBRAL DISC FINE-NEEDLE ASPIRATION MEDICATIONS: No antibiotics utilized. ANESTHESIA/SEDATION: Moderate (conscious) sedation was employed during this procedure. A total of Versed 2 mg and Fentanyl 100 mcg was administered intravenously by the radiology nurse. Total intra-service moderate Sedation Time: 22 minutes. The patient's level of  consciousness and vital signs were monitored continuously by radiology nursing throughout the procedure under my direct supervision. COMPLICATIONS: None immediate. PROCEDURE: Informed written consent was obtained from the patient after a thorough discussion of the procedural risks, benefits and alternatives. All questions were addressed. Maximal Sterile Barrier Technique was utilized including caps, mask, sterile gowns, sterile gloves, sterile drape, hand hygiene and skin antiseptic. A timeout was performed prior to the initiation of the procedure. The patient was placed in prone position on the angiography table. The thoracic spine region was prepped and draped in a sterile fashion. Under fluoroscopy, the T9-10 disc space was delineated and the skin area was marked. The skin was infiltrated with a 1% Lidocaine approximately 6 cm lateral to the spinous process projection on the left. Using a 22-gauge spinal needle, the deep soft issue were infiltrated with Bupivacaine 0.5%. Subsequently, a 17 gauge needle was advanced under fluoroscopy guided into the T9-10 disc space. The mandrel was removed and fine-needle aspiration pass performed with no yield. Then, approximately 2 mL of sterile saline were injected into the disc space and aspirated back. Pain in case colored fluid was obtained. Samples were obtained. The needle was subsequently withdrawn. The access sites were cleaned and covered with a sterile bandage. IMPRESSION: T9-10 intervertebral disc aspiration performed. Samples obtained were sent for Gram stain and culture. Electronically Signed   By: Baldemar Lenis M.D.   On: 10/29/2023 16:47   MR LUMBAR SPINE WO CONTRAST Result Date: 10/29/2023 CLINICAL DATA:  Initial evaluation for known spinal infection within the lower thoracic spine, evaluate for lumbar spine involvement. EXAM: MRI LUMBAR SPINE WITHOUT CONTRAST TECHNIQUE: Multiplanar, multisequence MR imaging of the lumbar spine was performed.  No intravenous contrast was administered. COMPARISON:  Prior study from 10/16/2023 FINDINGS: Segmentation: Standard. Lowest well-formed disc space labeled the L5-S1 level. Alignment: Levoscoliosis. Chronic grade 1 retrolisthesis of L1 on L2, with grade 1  anterolisthesis of L4 on L5, stable. Vertebrae: Extensive susceptibility artifact related to prior posterior fusion extending from T11 through L5. Prior ALIF at L5-S1. vertebral body height maintained without acute or interval fracture. Bone marrow signal intensity within normal limits. No evidence for acute infection within the lumbar spine. No worrisome osseous lesions. Conus medullaris and cauda equina: Conus extends to the L2 level. Conus and cauda equina are grossly unremarkable, though evaluation limited by artifact. Paraspinal and other soft tissues: Chronic postoperative scarring within the posterior paraspinous soft tissues. No acute finding. Disc levels: Underlying degenerative spondylosis with postoperative changes, stable as compared to recent MRI from 10/15/2018 fibroid these findings are fully described. No new abnormality. IMPRESSION: 1. No evidence for acute infection within the lumbar spine. 2. Postoperative changes related to prior posterior fusion extending from T11 through L5, with ALIF at L5-S1. Findings are stable as compared to recent MRI from 10/16/2023. 3. Underlying degenerative spondylosis, stable as compared to recent MRI from 10/16/2023 where these changes are fully described. Electronically Signed   By: Rise Mu M.D.   On: 10/29/2023 03:28   MR Cervical Spine Wo Contrast Result Date: 10/28/2023 CLINICAL DATA:  Bilateral upper and lower extremity weakness and numbness EXAM: MRI CERVICAL AND THORACIC SPINE WITHOUT CONTRAST TECHNIQUE: Multiplanar and multiecho pulse sequences of the cervical spine, to include the craniocervical junction and cervicothoracic junction, and the thoracic spine, were obtained without intravenous  contrast. COMPARISON:  04/16/2022 MRI cervical and thoracic spine FINDINGS: MRI CERVICAL SPINE FINDINGS Alignment: Straightening of the normal cervical lordosis. 3 mm anterolisthesis of C7 on T1, unchanged. Vertebrae: No acute fracture, evidence of discitis, or suspicious osseous lesion. ACDF C3-C7, likely with solid arthrodesis. Cord: Redemonstrated chronic myelomalacia in the anterior right spinal cord at C6-C7. Otherwise normal signal and morphology. Posterior Fossa, vertebral arteries, paraspinal tissues: No acute finding. Disc levels: C2-C3: Mild disc bulge. Facet and uncovertebral hypertrophy. No spinal canal stenosis. Mild left neural foraminal narrowing, new from the prior exam. C3-C4: Prior fusion. Left-greater-than-right facet arthropathy. No spinal canal stenosis. Mild left neural foraminal narrowing, new from the prior exam. C4-C5: Prior fusion. No residual spinal canal stenosis. Facet arthropathy. Mild left neural foraminal narrowing, similar to prior. C5-C6: Status post fusion. Facet arthropathy. No residual spinal canal stenosis. Moderate right neural foraminal narrowing, unchanged. C6-C7: Status post fusion. Facet arthropathy. No spinal canal stenosis or neural foraminal narrowing. C7-T1: Anterolisthesis with disc unroofing. Facet arthropathy. No spinal canal stenosis. Moderate bilateral neural foraminal narrowing, unchanged. MRI THORACIC SPINE FINDINGS Alignment:  No significant listhesis. Vertebrae: Fluid in the disc space at T9-T10, which is new compared to the 04/16/2022 exam. Some increased T2 signal is also noted in the endplates at T9-T10. Solid osseous fusion of T8 and T9. Redemonstrated posterior fusion extending from T10 through the lumbar spine, without definite solid arthrodesis. No evidence of acute fracture. Cord: Evaluation of the spinal cord from T9 through the lumbar spine is limited by susceptibility artifact from the hardware. Within this limitation, the visualized spinal cord is  normal in signal and morphology. Possible epidural material at T9-T10 (series 12, image 28), which causes thecal sac narrowing. Paraspinal and other soft tissues: No acute finding. Postsurgical changes in the soft tissues posterior to T9-L4 Disc levels: Moderate thecal sac narrowing at T9-T10, in part secondary to suspected epidural material (series 12, images 27-29), which may represent phlegmon, although evaluation is limited by the absence of intravenous contrast. No other spinal canal stenosis is seen. Redemonstrated moderate right neural foraminal narrowing  at T1-T2, mild bilateral neural foraminal narrowing at T3-T4, and mild left neural foraminal narrowing at T9-T10. Evaluation of the lower thoracic neural foramina is limited by susceptibility artifact, particularly on the left. IMPRESSION: CERVICAL SPINE: 1. C3-C7 ACDF, likely with solid arthrodesis. No residual spinal canal stenosis. Mild left neural foraminal narrowing at C3-C4 and C4-C5, new from the prior exam. 2. C5-C6 moderate right neural foraminal narrowing, unchanged. 3. C7-T1 moderate bilateral neural foraminal narrowing, unchanged. 4. Redemonstrated chronic myelomalacia in the anterior right spinal cord at C6-C7. THORACIC SPINE: 1. Fluid in the disc space at T9-T10, which is new compared to the 04/16/2022 exam, with some increased T2 signal in the endplates at T9-T10. These findings are concerning for discitis/osteomyelitis. 2. Moderate thecal sac narrowing at T9-T10, in part secondary to suspected epidural material, which may represent phlegmon, although evaluation is limited by the absence of intravenous contrast. 3. Redemonstrated posterior fusion extending from T10 through the lumbar spine, without definite solid arthrodesis. 4. Redemonstrated moderate right neural foraminal narrowing at T1-T2, mild bilateral neural foraminal narrowing at T3-T4, and mild left neural foraminal narrowing at T9-T10. These results were called by telephone at the  time of interpretation on 10/28/2023 at 7:22 pm to provider [facility] , who verbally acknowledged these results. Electronically Signed   By: Wiliam Ke M.D.   On: 10/28/2023 19:23   MR THORACIC SPINE WO CONTRAST Result Date: 10/28/2023 CLINICAL DATA:  Bilateral upper and lower extremity weakness and numbness EXAM: MRI CERVICAL AND THORACIC SPINE WITHOUT CONTRAST TECHNIQUE: Multiplanar and multiecho pulse sequences of the cervical spine, to include the craniocervical junction and cervicothoracic junction, and the thoracic spine, were obtained without intravenous contrast. COMPARISON:  04/16/2022 MRI cervical and thoracic spine FINDINGS: MRI CERVICAL SPINE FINDINGS Alignment: Straightening of the normal cervical lordosis. 3 mm anterolisthesis of C7 on T1, unchanged. Vertebrae: No acute fracture, evidence of discitis, or suspicious osseous lesion. ACDF C3-C7, likely with solid arthrodesis. Cord: Redemonstrated chronic myelomalacia in the anterior right spinal cord at C6-C7. Otherwise normal signal and morphology. Posterior Fossa, vertebral arteries, paraspinal tissues: No acute finding. Disc levels: C2-C3: Mild disc bulge. Facet and uncovertebral hypertrophy. No spinal canal stenosis. Mild left neural foraminal narrowing, new from the prior exam. C3-C4: Prior fusion. Left-greater-than-right facet arthropathy. No spinal canal stenosis. Mild left neural foraminal narrowing, new from the prior exam. C4-C5: Prior fusion. No residual spinal canal stenosis. Facet arthropathy. Mild left neural foraminal narrowing, similar to prior. C5-C6: Status post fusion. Facet arthropathy. No residual spinal canal stenosis. Moderate right neural foraminal narrowing, unchanged. C6-C7: Status post fusion. Facet arthropathy. No spinal canal stenosis or neural foraminal narrowing. C7-T1: Anterolisthesis with disc unroofing. Facet arthropathy. No spinal canal stenosis. Moderate bilateral neural foraminal narrowing, unchanged. MRI  THORACIC SPINE FINDINGS Alignment:  No significant listhesis. Vertebrae: Fluid in the disc space at T9-T10, which is new compared to the 04/16/2022 exam. Some increased T2 signal is also noted in the endplates at T9-T10. Solid osseous fusion of T8 and T9. Redemonstrated posterior fusion extending from T10 through the lumbar spine, without definite solid arthrodesis. No evidence of acute fracture. Cord: Evaluation of the spinal cord from T9 through the lumbar spine is limited by susceptibility artifact from the hardware. Within this limitation, the visualized spinal cord is normal in signal and morphology. Possible epidural material at T9-T10 (series 12, image 28), which causes thecal sac narrowing. Paraspinal and other soft tissues: No acute finding. Postsurgical changes in the soft tissues posterior to T9-L4 Disc levels: Moderate  thecal sac narrowing at T9-T10, in part secondary to suspected epidural material (series 12, images 27-29), which may represent phlegmon, although evaluation is limited by the absence of intravenous contrast. No other spinal canal stenosis is seen. Redemonstrated moderate right neural foraminal narrowing at T1-T2, mild bilateral neural foraminal narrowing at T3-T4, and mild left neural foraminal narrowing at T9-T10. Evaluation of the lower thoracic neural foramina is limited by susceptibility artifact, particularly on the left. IMPRESSION: CERVICAL SPINE: 1. C3-C7 ACDF, likely with solid arthrodesis. No residual spinal canal stenosis. Mild left neural foraminal narrowing at C3-C4 and C4-C5, new from the prior exam. 2. C5-C6 moderate right neural foraminal narrowing, unchanged. 3. C7-T1 moderate bilateral neural foraminal narrowing, unchanged. 4. Redemonstrated chronic myelomalacia in the anterior right spinal cord at C6-C7. THORACIC SPINE: 1. Fluid in the disc space at T9-T10, which is new compared to the 04/16/2022 exam, with some increased T2 signal in the endplates at T9-T10. These  findings are concerning for discitis/osteomyelitis. 2. Moderate thecal sac narrowing at T9-T10, in part secondary to suspected epidural material, which may represent phlegmon, although evaluation is limited by the absence of intravenous contrast. 3. Redemonstrated posterior fusion extending from T10 through the lumbar spine, without definite solid arthrodesis. 4. Redemonstrated moderate right neural foraminal narrowing at T1-T2, mild bilateral neural foraminal narrowing at T3-T4, and mild left neural foraminal narrowing at T9-T10. These results were called by telephone at the time of interpretation on 10/28/2023 at 7:22 pm to provider [facility] , who verbally acknowledged these results. Electronically Signed   By: Wiliam Ke M.D.   On: 10/28/2023 19:23   Scheduled Meds:  aspirin EC  81 mg Oral q AM   atenolol  50 mg Oral Daily   celecoxib  200 mg Oral BID   chlorzoxazone  500 mg Oral QID   cyclobenzaprine  10 mg Oral TID   diazepam  5 mg Oral QHS   diphenhydrAMINE  50 mg Oral QID   ferrous sulfate  325 mg Oral q AM   gabapentin  1,200 mg Oral TID   lubiprostone  48 mcg Oral QAC lunch   methocarbamol  500 mg Oral QID   multivitamin with minerals  1 tablet Oral q AM   pantoprazole  40 mg Oral Daily   Continuous Infusions:  ceFEPime (MAXIPIME) IV 2 g (10/30/23 0821)    LOS: 2 days   Marguerita Merles, DO [facility] Available via Epic secure chat 7am-7pm After these hours, please refer to coverage provider listed on [email or web address] 10/30/2023, 2:46 PM

## 2023-10-30 NOTE — Plan of Care (Signed)

## 2023-10-30 NOTE — Plan of Care (Signed)
  Problem: Education: Goal: Knowledge of General Education information will improve Description: Including pain rating scale, medication(s)/side effects and non-pharmacologic comfort measures Outcome: Progressing   Problem: Clinical Measurements: Goal: Ability to maintain clinical measurements within normal limits will improve Outcome: Progressing Goal: Will remain free from infection Outcome: Progressing   Problem: Activity: Goal: Risk for activity intolerance will decrease Outcome: Progressing   Problem: Nutrition: Goal: Adequate nutrition will be maintained Outcome: Progressing   Problem: Elimination: Goal: Will not experience complications related to bowel motility Outcome: Progressing

## 2023-10-30 NOTE — TOC Initial Note (Addendum)
Transition of Care [facility]) - Initial/Assessment Note    Patient Details  Name: [name] MRN: [id number] Date of Birth: Feb 01, 1961  Transition of [facility]) CM/SW Contact:    Harriet Masson, RN Phone Number: 10/30/2023, 4:05 PM  Clinical Narrative:                 Spoke to patient regarding transition needs.  Patient is active with [facility] for PT, OT. Calvin with [facility] is aware of hospitalization.  Patient lives alone and has all needed DME. Patient usually drives herself but can find transportation as needed. TOC will continue to follow for needs. Need resumption orders for home health PT, OT Expected Discharge Plan: Home w Home Health Services Barriers to Discharge: Continued Medical Work up   Patient Goals and CMS Choice Patient states their goals for this hospitalization and ongoing recovery are:: return home   Choice offered to / list presented to : Patient      Expected Discharge Plan and Services     Post Acute Care Choice: Home Health Living arrangements for the past 2 months: Single Family Home                           HH Arranged: PT, OT HH Agency: [facility] Date [facility] Agency Contacted: 10/30/23 Time HH Agency Contacted: 1604 Representative spoke with at [facility] Agency: Jerilynn Som  Prior Living Arrangements/Services Living arrangements for the past 2 months: Single Family Home   Patient language and need for interpreter reviewed:: Yes Do you feel safe going back to the place where you live?: Yes      Need for Family Participation in Patient Care: Yes (Comment) Care giver support system in place?: Yes (comment) Current home services: Home OT, Home PT Criminal Activity/Legal Involvement Pertinent to Current Situation/Hospitalization: No - Comment as needed  Activities of Daily Living   ADL Screening (condition at time of admission) Independently performs ADLs?: Yes (appropriate for developmental age) Is the patient deaf or have difficulty  hearing?: No Does the patient have difficulty seeing, even when wearing glasses/contacts?: No Does the patient have difficulty concentrating, remembering, or making decisions?: No  Permission Sought/Granted                  Emotional Assessment Appearance:: Appears stated age Attitude/Demeanor/Rapport: Engaged Affect (typically observed): Accepting Orientation: : Oriented to Self, Oriented to  Time, Oriented to Place, Oriented to Situation Alcohol / Substance Use: Not Applicable Psych Involvement: No (comment)  Admission diagnosis:  Diskitis [M46.40] Vertebral osteomyelitis (HCC) [M46.20] Extremity numbness [R20.0] Weakness of both lower extremities [R29.898] Patient Active Problem List   Diagnosis Date Noted   Diskitis 10/28/2023   Wound infection after surgery 05/14/2022   Delayed surgical wound healing 05/12/2022   Postoperative wound infection 04/08/2022   Scoliosis due to degenerative disease of spine in adult patient 03/30/2022   HNP (herniated nucleus pulposus), cervical 12/08/2020   Lumbago with sciatica, unspecified side 07/28/2020   PICC (peripherally inserted central catheter) in place 03/16/2020   Encounter for long-term (current) use of antibiotics 03/16/2020   Polypharmacy 03/09/2020   At risk for adverse drug event 03/09/2020   MSSA bacteremia 02/29/2020   Postoperative infection 02/28/2020   Abnormal gait due to muscle weakness 02/26/2020   HNP (herniated nucleus pulposus), lumbar 02/16/2020   Muscle weakness of lower extremity 02/14/2020   Spondylolisthesis of lumbar region 10/30/2017   Painful orthopaedic hardware right ankle 05/27/2016  Closed fracture of distal end of right fibula and tibia    Ankle syndesmosis disruption    ASCUS favor benign 01/06/2014   Leg swelling 12/22/2013   Seasonal allergies    Anemia    Chronic back pain due to DJD with history surgery    Hyperthyroidism    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 [name] [address] Phone: [phone] Fax: [phone]  [id number] - [place], [name] - [address] AT [facility] [address] [name] [address] Phone: [phone] Fax: [phone]     Social Drivers of Health (SDOH) Social History: SDOH Screenings   Food Insecurity: No Food Insecurity (10/29/2023)  Housing: Low Risk  (10/29/2023)  Transportation Needs: No Transportation Needs (10/29/2023)  Utilities: Not At Risk (10/29/2023)  Depression (PHQ2-9): Low Risk  (01/16/2023)  Tobacco Use: Medium Risk (10/29/2023)   SDOH Interventions:     Readmission Risk Interventions    05/17/2022    3:02 PM  Readmission Risk Prevention Plan  Transportation Screening Complete  PCP or Specialist Appt within 5-7 Days Complete  Home Care Screening Complete  Medication Review (RN CM) Complete

## 2023-10-30 NOTE — Progress Notes (Signed)
[facility]   Reason for visit: Follow up on thoracic discitis/osteomyelitis  Interval History: She has undergone aspiration and no positive growth on culture to date.  On cefepime day 2.  She has no complaints otherwise.   Physical Exam: Constitutional:  Vitals:   10/30/23 0650 10/30/23 0730  BP: 124/61 136/66  Pulse: 84 72  Resp:    Temp: 98.3 F (36.8 C) 97.8 F (36.6 C)  SpO2: 99% 96%   patient appears in NAD Respiratory: Normal respiratory effort  Review of Systems: Constitutional: negative for fevers and chills  Lab Results  Component Value Date   WBC 6.0 10/30/2023   HGB 12.4 10/30/2023   HCT 40.2 10/30/2023   MCV 85.2 10/30/2023   PLT 293 10/30/2023    Lab Results  Component Value Date   CREATININE 0.65 10/30/2023   BUN 18 10/30/2023   NA 133 (L) 10/30/2023   K 3.6 10/30/2023   CL 97 (L) 10/30/2023   CO2 27 10/30/2023    Lab Results  Component Value Date   ALT 21 10/30/2023   AST 19 10/30/2023   ALKPHOS 103 10/30/2023     Microbiology: Recent Results (from the past 240 hours)  Blood culture (routine x 2)     Status: None (Preliminary result)   Collection Time: 10/28/23  8:12 PM   Specimen: BLOOD  Result Value Ref Range Status   Specimen Description BLOOD LEFT ANTECUBITAL  Final   Special Requests   Final    BOTTLES DRAWN AEROBIC ONLY Blood Culture adequate volume   Culture   Final    NO GROWTH < 12 HOURS Performed at [facility], [address], [place], [name] [address]    Report Status PENDING  Incomplete  Blood culture (routine x 2)     Status: None (Preliminary result)   Collection Time: 10/28/23  8:24 PM   Specimen: BLOOD  Result Value Ref Range Status   Specimen Description BLOOD RIGHT ANTECUBITAL  Final   Special Requests   Final    BOTTLES DRAWN AEROBIC AND ANAEROBIC Blood Culture adequate volume   Culture   Final    NO GROWTH < 12 HOURS Performed at [facility], [address]  [place], [name] [address]    Report Status PENDING  Incomplete  Aerobic/Anaerobic Culture w Gram Stain (surgical/deep wound)     Status: None (Preliminary result)   Collection Time: 10/29/23  3:45 PM   Specimen: Fine Needle Aspirate  Result Value Ref Range Status   Specimen Description NEEDLE ASPIRATE  Final   Special Requests INTERVERTEBRAL DISC  Final   Gram Stain NO WBC SEEN NO ORGANISMS SEEN   Final   Culture   Final    NO GROWTH < 24 HOURS Performed at [facility], [address], [place], [name] [address]    Report Status PENDING  Incomplete  Aerobic/Anaerobic Culture w Gram Stain (surgical/deep wound)     Status: None (Preliminary result)   Collection Time: 10/29/23  3:45 PM   Specimen: NEEDLE ASPIRATE; Abscess  Result Value Ref Range Status   Specimen Description NEEDLE ASPIRATE  Final   Special Requests INTERVERTEBRAL DISC  Final   Gram Stain NO WBC SEEN NO ORGANISMS SEEN   Final   Culture   Final    NO GROWTH < 24 HOURS Performed at [facility], [address], [place], [name] [address]    Report Status PENDING  Incomplete    Impression/Plan:  1.  Thoracic discitis/osteomyelitis.  Positive on MRI but cultures with no growth.  On cefepime and will continue with this based on previous culture results. Will plan on a prolonged course with 6 weeks of IV antibiotics with cefepime if there is no positive growth.  2.  Access.  Will plan on a PICC line tomorrow if the blood cultures remain negative.  3.  Antibiotic allergy.  She tolerated Bactrim for a prolonged period with no issues.  Listed as an intolerance with "migraines".  Will delist this as an allergy

## 2023-10-31 ENCOUNTER — Other Ambulatory Visit: Payer: Self-pay

## 2023-10-31 DIAGNOSIS — Z79899 Other long term (current) drug therapy: Secondary | ICD-10-CM | POA: Diagnosis not present

## 2023-10-31 DIAGNOSIS — M4644 Discitis, unspecified, thoracic region: Secondary | ICD-10-CM | POA: Diagnosis not present

## 2023-10-31 DIAGNOSIS — I10 Essential (primary) hypertension: Secondary | ICD-10-CM | POA: Diagnosis not present

## 2023-10-31 LAB — COMPREHENSIVE METABOLIC PANEL
ALT: 18 U/L (ref 0–44)
AST: 16 U/L (ref 15–41)
Albumin: 3 g/dL — ABNORMAL LOW (ref 3.5–5.0)
Alkaline Phosphatase: 97 U/L (ref 38–126)
Anion gap: 14 (ref 5–15)
BUN: 22 mg/dL (ref 8–23)
CO2: 25 mmol/L (ref 22–32)
Calcium: 9 mg/dL (ref 8.9–10.3)
Chloride: 100 mmol/L (ref 98–111)
Creatinine, Ser: 0.96 mg/dL (ref 0.44–1.00)
GFR, Estimated: >60 mL/min (ref >60)
Glucose, Bld: 107 mg/dL — ABNORMAL HIGH (ref 70–99)
Potassium: 3.5 mmol/L (ref 3.5–5.1)
Sodium: 139 mmol/L (ref 135–145)
Total Bilirubin: 0.3 mg/dL (ref 0.0–1.2)
Total Protein: 5.6 g/dL — ABNORMAL LOW (ref 6.5–8.1)

## 2023-10-31 LAB — CBC WITH DIFFERENTIAL/PLATELET
Abs Immature Granulocytes: 0.02 10*3/uL (ref 0.00–0.07)
Basophils Absolute: 0 10*3/uL (ref 0.0–0.1)
Basophils Relative: 1 %
Eosinophils Absolute: 0.4 10*3/uL (ref 0.0–0.5)
Eosinophils Relative: 6 %
HCT: 38 % (ref 36.0–46.0)
Hemoglobin: 11.9 g/dL — ABNORMAL LOW (ref 12.0–15.0)
Immature Granulocytes: 0 %
Lymphocytes Relative: 16 %
Lymphs Abs: 1 10*3/uL (ref 0.7–4.0)
MCH: 26.7 pg (ref 26.0–34.0)
MCHC: 31.3 g/dL (ref 30.0–36.0)
MCV: 85.2 fL (ref 80.0–100.0)
Monocytes Absolute: 0.8 10*3/uL (ref 0.1–1.0)
Monocytes Relative: 13 %
Neutro Abs: 4.1 10*3/uL (ref 1.7–7.7)
Neutrophils Relative %: 64 %
Platelets: 277 10*3/uL (ref 150–400)
RBC: 4.46 MIL/uL (ref 3.87–5.11)
RDW: 12.7 % (ref 11.5–15.5)
WBC: 6.4 10*3/uL (ref 4.0–10.5)
nRBC: 0 % (ref 0.0–0.2)

## 2023-10-31 LAB — HEMOGLOBIN A1C
Hgb A1c MFr Bld: 5.1 % (ref 4.8–5.6)
Mean Plasma Glucose: 99.67 mg/dL

## 2023-10-31 LAB — MAGNESIUM: Magnesium: 2 mg/dL (ref 1.7–2.4)

## 2023-10-31 LAB — PHOSPHORUS: Phosphorus: 4.9 mg/dL — ABNORMAL HIGH (ref 2.5–4.6)

## 2023-10-31 NOTE — Evaluation (Signed)
Physical Therapy Evaluation Patient Details Name: [name] MRN: [id number] DOB: 24-May-1961 Today's Date: 10/31/2023  History of Present Illness  Pt is a 63 y/o F admitted on 10/28/23 after presenting with c/o ongoing intermittent numbness & weakness in all extremities. MRI concerning for T9 discitis/osteomyelitis & T9 epidural phlegmon/abscess with moderate thecal compression. PMH: morbid obesity, multiple spine sxs, chronic back pain, spine infection  Clinical Impression  Pt seen for PT evaluation with pt agreeable to tx. Pt reports prior to admission she was ambulatory with cane or rollator, notes a couple falls in the past 6 month. Pt reports she was driving & living alone. On this date, pt use of hospital bed features to complete supine>sit, min assist for STS & min<>mod assist for short distances gait with RW. Pt experiences 2 episodes of R knee buckling during gait, requiring mod assist to prevent fall. Pt also presents with impaired proprioception RLE. Pt would benefit from PT services to address strengthening, balance, & endurance to increase independence with mobility & decrease fall risk.      If plan is discharge home, recommend the following: A lot of help with walking and/or transfers;A lot of help with bathing/dressing/bathroom;Assist for transportation;Assistance with cooking/housework;Help with stairs or ramp for entrance   Can travel by private vehicle        Equipment Recommendations  (TBD)  Recommendations for Other Services       Functional Status Assessment Patient has had a recent decline in their functional status and demonstrates the ability to make significant improvements in function in a reasonable and predictable amount of time.     Precautions / Restrictions Precautions Precautions: Fall Precaution Comments: R knee buckles Restrictions Weight Bearing Restrictions Per Provider Order: No      Mobility  Bed Mobility Overal bed mobility: Needs  Assistance Bed Mobility: Supine to Sit     Supine to sit: HOB elevated, Contact guard, Used rails     General bed mobility comments: requests HOB elevated to increase ease of movement    Transfers Overall transfer level: Needs assistance Equipment used: Rolling walker (2 wheels) Transfers: Sit to/from Stand Sit to Stand: Min assist           General transfer comment: education re: hand placement during STS with RW    Ambulation/Gait Ambulation/Gait assistance: Min assist, Mod assist Gait Distance (Feet): 15 Feet Assistive device: Rolling walker (2 wheels) Gait Pattern/deviations: Decreased step length - left, Decreased step length - right, Decreased stride length, Decreased dorsiflexion - right, Narrow base of support Gait velocity: decreased     General Gait Details: Pt with decreased neuromuscular control RLE, cuing for increased step width with fair return demo. R knee buckles x 2.  Stairs            Wheelchair Mobility     Tilt Bed    Modified Rankin (Stroke Patients Only)       Balance Overall balance assessment: Needs assistance, History of Falls Sitting-balance support: Feet supported Sitting balance-[name]: Fair Sitting balance - Comments: supervision static sitting   Standing balance support: During functional activity, Bilateral upper extremity supported, Reliant on assistive device for balance Standing balance-[name]: Poor                               Pertinent Vitals/Pain Pain Assessment Pain Assessment: Faces Faces Pain Scale: Hurts little more Pain Location: generalized Pain Descriptors / Indicators: Discomfort Pain Intervention(s):  Monitored during session    Home Living Family/patient expects to be discharged to:: Private residence Living Arrangements: Alone Available Help at Discharge: Family;Friend(s);Available PRN/intermittently Type of Home: House Home Access: Stairs to enter Entrance Stairs-Rails:  Right;Left;Can reach both Entrance Stairs-Number of Steps: 5   Home Layout: One level Home Equipment: Shower seat;Hand held Programmer, systems (2 wheels);Rollator (4 wheels);Grab bars - tub/shower;Cane - single point;Adaptive equipment;BSC/3in1;Crutches Additional Comments: Pt notes a few falls in the last month since coming off antibiotics. BLE numbness and R knee buckling. Recieves HHPT/HHOT    Prior Function Prior Level of Function : History of Falls (last six months);Driving;Needs assist       Physical Assist : ADLs (physical)   ADLs (physical): Dressing;Bathing Mobility Comments: Mod I with rollator, cane or crutches ADLs Comments: gets groceries delievered, nephew has been helping with dressing/bathing recently     Extremity/Trunk Assessment   Upper Extremity Assessment Upper Extremity Assessment: Defer to OT evaluation;Generalized weakness    Lower Extremity Assessment Lower Extremity Assessment: Generalized weakness;RLE deficits/detail (BLE sensation to light touch equal) RLE Deficits / Details: decreased proprioception RLE       Communication   Communication Communication: No apparent difficulties  Cognition Arousal: Alert Behavior During Therapy: WFL for tasks assessed/performed Overall Cognitive Status: Within Functional Limits for tasks assessed                                          General Comments      Exercises     Assessment/Plan    PT Assessment Patient needs continued PT services  PT Problem List Decreased strength;Decreased activity tolerance;Decreased mobility;Decreased safety awareness;Decreased balance;Impaired sensation       PT Treatment Interventions DME instruction;Balance training;Gait training;Neuromuscular re-education;Stair training;Functional mobility training;Therapeutic activities;Therapeutic exercise;Manual techniques;Patient/family education;Modalities    PT Goals (Current goals can be found in the  Care Plan section)  Acute Rehab PT Goals Patient Stated Goal: get better PT Goal Formulation: With patient Time For Goal Achievement: 11/14/23 Potential to Achieve Goals: Good    Frequency Min 1X/week     Co-evaluation PT/OT/SLP Co-Evaluation/Treatment: Yes Reason for Co-Treatment: Complexity of the patient's impairments (multi-system involvement);For patient/therapist safety PT goals addressed during session: Mobility/safety with mobility;Balance;Proper use of DME OT goals addressed during session: ADL's and self-care;Proper use of Adaptive equipment and DME       AM-PAC PT "6 Clicks" Mobility  Outcome Measure Help needed turning from your back to your side while in a flat bed without using bedrails?: A Little Help needed moving from lying on your back to sitting on the side of a flat bed without using bedrails?: A Little Help needed moving to and from a bed to a chair (including a wheelchair)?: A Lot Help needed standing up from a chair using your arms (e.g., wheelchair or bedside chair)?: A Little Help needed to walk in hospital room?: A Lot Help needed climbing 3-5 steps with a railing? : Total 6 Click Score: 14    End of Session   Activity Tolerance: Patient tolerated treatment well;Patient limited by fatigue Patient left: in chair;with call bell/phone within reach Nurse Communication: Mobility status PT Visit Diagnosis: History of falling (Z91.81);Muscle weakness (generalized) (M62.81);Other abnormalities of gait and mobility (R26.89);Difficulty in walking, not elsewhere classified (R26.2);Unsteadiness on feet (R26.81)    Time: 1610-9604 PT Time Calculation (min) (ACUTE ONLY): 52 min   Charges:   PT Evaluation $  PT Eval Moderate Complexity: 1 Mod   PT General Charges $$ ACUTE PT VISIT: 1 Visit         Aleda Grana, PT, DPT 10/31/23, 1:08 PM   Sandi Mariscal 10/31/2023, 1:07 PM

## 2023-10-31 NOTE — Progress Notes (Signed)
[facility]   Reason for visit: Follow up on thoracic discitis/osteomyelitis  Interval History: No growth on culture to date.  WBC of 6.4.  Remains afebrile Day 3 of cefepime  Physical Exam: Constitutional:  Vitals:   10/31/23 0557 10/31/23 1036  BP: 125/67 120/73  Pulse: 76 87  Resp: 18   Temp:  (!) 97.4 F (36.3 C)  SpO2: 100% 93%   patient appears in NAD Respiratory: Normal respiratory effort  Review of Systems: Constitutional: negative for fevers and chills  Lab Results  Component Value Date   WBC 6.4 10/31/2023   HGB 11.9 (L) 10/31/2023   HCT 38.0 10/31/2023   MCV 85.2 10/31/2023   PLT 277 10/31/2023    Lab Results  Component Value Date   CREATININE 0.96 10/31/2023   BUN 22 10/31/2023   NA 139 10/31/2023   K 3.5 10/31/2023   CL 100 10/31/2023   CO2 25 10/31/2023    Lab Results  Component Value Date   ALT 18 10/31/2023   AST 16 10/31/2023   ALKPHOS 97 10/31/2023     Microbiology: Recent Results (from the past 240 hours)  Blood culture (routine x 2)     Status: None (Preliminary result)   Collection Time: 10/28/23  8:12 PM   Specimen: BLOOD  Result Value Ref Range Status   Specimen Description BLOOD LEFT ANTECUBITAL  Final   Special Requests   Final    BOTTLES DRAWN AEROBIC ONLY Blood Culture adequate volume   Culture   Final    NO GROWTH 3 DAYS Performed at [facility], [address] [place], [name] [address]    Report Status PENDING  Incomplete  Blood culture (routine x 2)     Status: None (Preliminary result)   Collection Time: 10/28/23  8:24 PM   Specimen: BLOOD  Result Value Ref Range Status   Specimen Description BLOOD RIGHT ANTECUBITAL  Final   Special Requests   Final    BOTTLES DRAWN AEROBIC AND ANAEROBIC Blood Culture adequate volume   Culture   Final    NO GROWTH 3 DAYS Performed at [facility], [address] [place], [name] [address]    Report Status PENDING  Incomplete   Aerobic/Anaerobic Culture w Gram Stain (surgical/deep wound)     Status: None (Preliminary result)   Collection Time: 10/29/23  3:45 PM   Specimen: Fine Needle Aspirate  Result Value Ref Range Status   Specimen Description NEEDLE ASPIRATE  Final   Special Requests INTERVERTEBRAL DISC  Final   Gram Stain NO WBC SEEN NO ORGANISMS SEEN   Final   Culture   Final    NO GROWTH 2 DAYS Performed at [facility], [address], [place], [name] [address]    Report Status PENDING  Incomplete  Aerobic/Anaerobic Culture w Gram Stain (surgical/deep wound)     Status: None (Preliminary result)   Collection Time: 10/29/23  3:45 PM   Specimen: NEEDLE ASPIRATE; Abscess  Result Value Ref Range Status   Specimen Description NEEDLE ASPIRATE  Final   Special Requests INTERVERTEBRAL DISC  Final   Gram Stain NO WBC SEEN NO ORGANISMS SEEN   Final   Culture   Final    NO GROWTH 2 DAYS Performed at [facility], [address] [place], [name] [address]    Report Status PENDING  Incomplete    Impression/Plan:  1.  Thoracic discitis/osteomyelitis.  Positive on MRI  and cultures without any growth.  She continues on cefepime based on previous Enterobacter growth as well as MSSA.  Will plan on a prolonged course for 6 weeks total as outlined below.  Discussed the consideration of prolonged oral suppression after this though the patient is adamant about not wanting to take medications long-term.  2.  Access.  Will order PICC line placed  3.  OPAT.  Outlined below.  Will otherwise sign off.  Will continue to monitor the cultures after discharge for any changes.    Diagnosis: Thoracic osteomyelitis  Culture Result: Previous cultures with Enterobacter cloacae and MSSA.  No current positive cultures  Allergies  Allergen Reactions   Monosodium Glutamate Anaphylaxis and Swelling    Eyes swollen shut, facial swelling, tongue swelling.   Shellfish Allergy Anaphylaxis   Baclofen Nausea Only     Dizziness and increase muscle spasm   Celebrex [Celecoxib] Itching    Only allergic to generic brand   Contrast Media [Iodinated Contrast Media] Itching and Nausea Only    "could not walk"   Diclofenac Itching    Generic Diclefenac gel causes itching. Can take the name brand Voltaren gel   Gadolinium     Allergic to MRI contrast dye per patient.   Other Other (See Comments)    Pet dander,   Green rubber piece to hold mouth open (oval shape)     Latex Itching and Rash    Latex glove with powder    OPAT Orders Discharge antibiotics to be given via PICC line Discharge antibiotics: Cefepime 2 g IV every 8 hours Per pharmacy protocol yes Aim for Vancomycin trough 15-20 or AUC 400-550 (unless otherwise indicated) Duration: 6 weeks End Date: December 11, 2023  [facility] Care Per Protocol: Yes  Home health RN for IV administration and teaching; PICC line care and labs.    Labs weekly while on IV antibiotics: __x CBC with differential __ BMP _x_ CMP _x_ CRP _x_ ESR __ Vancomycin trough __ CK  __ Please pull PIC at completion of IV antibiotics _x_ Please leave PIC in place until doctor has seen patient or been notified  Fax weekly labs to [phone]  Clinic Follow Up Appt: 11/13/23 with Dr. Thedore Mins  @ 2:30

## 2023-10-31 NOTE — Progress Notes (Signed)
Inpatient Rehab Admissions Coordinator Note:   Per therapy recommendations patient was screened for CIR candidacy by Stephania Fragmin, PT. At this time, pt appears to be a potential candidate for CIR. I will place an order for rehab consult for full assessment, per our protocol.  Please contact me any with questions.Estill Dooms, PT, DPT [phone] 10/31/23 2:01 PM

## 2023-10-31 NOTE — TOC Progression Note (Addendum)
Transition of Care [facility]) - Progression Note    Patient Details  Name: [name] MRN: [id number] Date of Birth: [birth year]  Transition of Care [facility]) CM/SW Contact  Harriet Masson, RN Phone Number: 10/31/2023, 2:07 PM  Clinical Narrative:     Elita Quick with [employer] reached out stating patient needs  home iv antibiotics.  Spoke to calvin with [facility] who states they can't provide HH-RN. Pam stated [facility] can provide home health RN. Patient declines CIR. TOC following.    Expected Discharge Plan: Home w Home Health Services Barriers to Discharge: Continued Medical Work up  Expected Discharge Plan and Services     Post Acute Care Choice: Home Health Living arrangements for the past 2 months: Single Family Home                           HH Arranged: PT, OT HH Agency: [facility] Date [facility] Agency Contacted: 10/30/23 Time HH Agency Contacted: 1604 Representative spoke with at [facility] Agency: Jerilynn Som   Social Determinants of Health (SDOH) Interventions SDOH Screenings   Food Insecurity: No Food Insecurity (10/29/2023)  Housing: Low Risk  (10/29/2023)  Transportation Needs: No Transportation Needs (10/29/2023)  Utilities: Not At Risk (10/29/2023)  Depression (PHQ2-9): Low Risk  (01/16/2023)  Tobacco Use: Medium Risk (10/29/2023)    Readmission Risk Interventions    05/17/2022    3:02 PM  Readmission Risk Prevention Plan  Transportation Screening Complete  PCP or Specialist Appt within 5-7 Days Complete  Home Care Screening Complete  Medication Review (RN CM) Complete

## 2023-10-31 NOTE — Progress Notes (Signed)
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  Subsequently she has been about a year and a half on Keflex before Dr. Drue Second decided to try her off of Keflex to see if there is any recurrence.  She went off of antibiotics about late May 2022 and did not have any recurrence of MSSA.  Subsequently she had surgical screw loosening back pain and ultimately he did surgery in June 2023.  This time is unfortunate complicated by postop wound infection sepsis and wound culture that grew out Enterobacter cloacae.  She is subsequently then started on Bactrim for a year and a half but that she thought she is having allergies and side effects given an acid medicine rash and headache was was itching so she elected to stop Bactrim in December 2024.  For last few weeks she has been having numbness in upper extremities and numbness and weakness in the bilateral extremities with no new bowel or bladder incontinence.  MRI of 1 9 of the L-spine did not show any findings of cord compression or infection then MRI in the ED on 10/28/2023 of the C and T-spine does show findings concerning for T9 discitis/osteomyelitis and T9 epidural phlegmon/abscess with moderate thecal compression.  Neurosurgery was consulted and recommended holding off starting antibiotics for the moment and ID was also consulted.  IR was also consulted for aspiration/biopsy and Dr. Tommie Sams approved the procedure for aspiration.  Aspiration done and Cx showing NGTD. Now ID started the patient on IV Cefepime and recommending the patient have a prolonged course of antibiotics with IV cefepime. PT/OT  recommending CIR.   Assessment and Plan:  T9 Discitis/Osteomyelitis with associated LE Weakness and Numbness -Pt with discitis, phlegmon, and moderate thecal sac impingement at T9. -Suspicious that source may be recurrence of a suppressed chronic spinal hardware infection in setting of pt taking herself off on bactrim in Dec.   -No other obvious sources this time around (ie open wounds, recent surgeries, etc). -Potential organisms at the top of my suspect list include enterobacter cloacae (2023 culture) and MSSA (2021 culture). -No SIRS or suggestion of systemic involvement at this point. -Dr. Julian Reil discussed with Neurosurgery who recommended holding off on starting ABx for the moment to allow for best chance of culture positive -Most likely plan will be IR aspiration/biopsy for cultures; IR consulted and Dr. Tommie Sams approved the procedure -Dr. Julian Reil asked given MRI L spine on 1/9: They do want repeat MRI L spine (with and without contrast) as well as T spine (with contrast, already has without today) to be ordered and it showed  -EDP also notified ID of pts admission and they evaluated; she has a known history of MSSA and Enterobacter Cloacea and did well on Bactrim suppression until recently when she came in with excess rashes on her elbows and was attributing this to Bactrim. -Blood Cx x2 show NGTD at 3 Days -Continue with Celecoxib 200 mg po BID, Chlorzoxazone 500 mg po 4 times daily, and methocarbamol 500 mg po 4 times daily -C/w Gabapentin mg po TID -Inflammatory Markers are not elevated  -ID consulted and cultures obtained and plan on starting cefepime  IV after aspiration; Now on Day 2 and per ID they are planning on a prolonged course with 6 weeks of IV antibiotics with IV cefepime and there is no positive growth -Given that she has no growth infectious disease recommending continuing IV cefepime based on her previous Enterobacter growth as well as MSSA and PICC line is  being ordered with OPAT orders being placed and recommendation for IV cefepime 2 g every 8 hours with the end date being December 11, 2023 -Need PT OT to further evaluate and treat given her lower extremity weakness and numbness and they are recommending CIR  Hyperglycemia  -Check HbA1c in the AM; Last HbA1c was 6.0 in our system -If necessary will place on sensitive NovoLog/scale insulin before meals and at bedtime -Glucose Level Trend:  Recent Labs  Lab 10/16/23 1739 10/28/23 1240 10/29/23 0518 10/30/23 0508 10/31/23 0538  GLUCOSE 109* 104* 107* 91 107*    Polypharmacy -Will need Caution with her multiple meds, will continue home meds for the moment.  Hyponatremia -Na+ Trend: Recent Labs  Lab 10/16/23 1739 10/28/23 1240 10/29/23 0518 10/30/23 0508 10/31/23 0538  NA 141 140 139 133* 139  -Continue to Monitor and Trend and repeat CMP in the AM   Hypertension -Continue Atenolol 50 mg po daily -Will hold Hydrochlorothiazide  -Continue to Monitor BP per Protocol -Last BP reading was 120/73  GERD/GI Prophylaxis -C/w PPI with Pantoprazole 40 mg po daily   Hypoalbuminemia -Patient's Albumin Trend: Recent Labs  Lab 10/28/23 1240 10/30/23 0508 10/31/23 0538  ALBUMIN 3.7 3.2* 3.0*  -Continue to Monitor and Trend and repeat CMP in the AM'  Class III Obesity (Morbid) -Complicates overall prognosis and care -Estimated body mass index is 40.97 kg/m as calculated from the following:   Height as of 09/23/23: 5\' 2"  (1.575 m).   Weight as of this encounter: 101.6 kg.  -Weight Loss and Dietary Counseling given   DVT prophylaxis: SCDs Start: 10/28/23 2338    Code Status: Full Code Family Communication: No family present at bedside  Disposition Plan:  Level of care: Med-Surg Status is: Inpatient Remains inpatient appropriate because: PT/OT recommendign CIR   Consultants:  Neurosurgery Infectious Diseases IR  Procedures:  As delineated as above  Antimicrobials:   Anti-infectives (From admission, onward)    Start     Dose/Rate Route Frequency Ordered Stop   10/29/23 1700  ceFEPIme (MAXIPIME) 2 g in sodium chloride 0.9 % 100 mL IVPB        2 g 200 mL/hr over 30 Minutes Intravenous Every 8 hours 10/29/23 1603         Subjective: In and examined at bedside and was complaining that she was incontinent on herself during the nighttime hours and having trouble getting to the bathroom quickly.  Asking for PureWick for night.  No nausea or vomiting.  Frustrated that she needs to have another PICC line.  Denies any chest pain or abdominal pain but complaining of some numbness in her feet and arms.  No other concerns or complaint at this time.  Objective: Vitals:   10/30/23 1700 10/30/23 2206 10/31/23 0557 10/31/23 1036  BP:  (!) 140/79 125/67 120/73  Pulse:  91 76 87  Resp:  19 18   Temp: 98.5 F (36.9 C) 98.4 F (36.9 C)  (!) 97.4 F (36.3 C)  TempSrc:  Oral  Oral  SpO2:  96% 100% 93%  Weight:       No intake or output data in the 24  hours ending 10/31/23 1513 Filed Weights   10/29/23 1455  Weight: 101.6 kg   Examination: Physical Exam:  Constitutional: WN/WD morbidly obese female in no acute distress appears calm Respiratory: Diminished to auscultation bilaterally, no wheezing, rales, rhonchi or crackles. Normal respiratory effort and patient is not tachypenic. No accessory muscle use.  Unlabored breathing Cardiovascular: RRR, no murmurs / rubs / gallops. S1 and S2 auscultated. No extremity edema.  Abdomen: Soft, non-tender, distended secondary body habitus. Bowel sounds positive.  GU: Deferred. Musculoskeletal: No clubbing / cyanosis of digits/nails. No joint deformity upper and lower extremities.  Skin: No rashes, lesions, ulcers on limited skin evaluation. No induration; Warm and dry.  Neurologic: CN 2-12 grossly intact with no focal deficits.  As some numbness noted Psychiatric: Normal judgment and insight. Alert and oriented x 3.  Normal mood and appropriate affect.   Data Reviewed: I have personally reviewed following labs and imaging studies  CBC: Recent Labs  Lab 10/28/23 1240 10/29/23 0518 10/30/23 0508 10/31/23 0538  WBC 8.0 8.1 6.0 6.4  NEUTROABS 4.8  --  3.2 4.1  HGB 13.8 12.9 12.4 11.9*  HCT 46.3* 42.6 40.2 38.0  MCV 88.4 86.1 85.2 85.2  PLT 289 334 293 277   Basic Metabolic Panel: Recent Labs  Lab 10/28/23 1240 10/29/23 0518 10/30/23 0508 10/31/23 0538  NA 140 139 133* 139  K 3.9 3.6 3.6 3.5  CL 104 101 97* 100  CO2 28 25 27 25   GLUCOSE 104* 107* 91 107*  BUN 14 15 18 22   CREATININE 0.64 0.58 0.65 0.96  CALCIUM 9.7 9.6 8.6* 9.0  MG  --   --  2.0 2.0  PHOS  --   --  4.7* 4.9*   GFR: Estimated Creatinine Clearance: 68.7 mL/min (by C-G formula based on SCr of 0.96 mg/dL). Liver Function Tests: Recent Labs  Lab 10/28/23 1240 10/30/23 0508 10/31/23 0538  AST 25 19 16   ALT 24 21 18   ALKPHOS 124 103 97  BILITOT 0.3 0.4 0.3  PROT 6.3* 5.6* 5.6*  ALBUMIN 3.7 3.2* 3.0*   No results for input(s): "LIPASE", "AMYLASE" in the last 168 hours. No results for input(s): "AMMONIA" in the last 168 hours. Coagulation Profile: No results for input(s): "INR", "PROTIME" in the last 168 hours. Cardiac Enzymes: No results for input(s): "CKTOTAL", "CKMB", "CKMBINDEX", "TROPONINI" in the last 168 hours. BNP (last 3 results) No results for input(s): "PROBNP" in the last 8760 hours. HbA1C: Recent Labs    10/31/23 0538  HGBA1C 5.1   CBG: No results for input(s): "GLUCAP" in the last 168 hours. Lipid Profile: No results for input(s): "CHOL", "HDL", "LDLCALC", "TRIG", "CHOLHDL", "LDLDIRECT" in the last 72 hours. Thyroid Function Tests: No results for input(s): "TSH", "T4TOTAL", "FREET4", "T3FREE", "THYROIDAB" in the last 72 hours. Anemia Panel: No results for input(s): "VITAMINB12", "FOLATE", "FERRITIN", "TIBC", "IRON", "RETICCTPCT" in the last 72 hours. Sepsis Labs: Recent Labs  Lab  10/28/23 1306  LATICACIDVEN 1.0   Recent Results (from the past 240 hours)  Blood culture (routine x 2)     Status: None (Preliminary result)   Collection Time: 10/28/23  8:12 PM   Specimen: BLOOD  Result Value Ref Range Status   Specimen Description BLOOD LEFT ANTECUBITAL  Final   Special Requests   Final    BOTTLES DRAWN AEROBIC ONLY Blood Culture adequate volume   Culture   Final    NO GROWTH 3 DAYS Performed at [facility], [address], [place],  [name] [address]    Report Status PENDING  Incomplete  Blood culture (routine x 2)     Status: None (Preliminary result)   Collection Time: 10/28/23  8:24 PM   Specimen: BLOOD  Result Value Ref Range Status   Specimen Description BLOOD RIGHT ANTECUBITAL  Final   Special Requests   Final    BOTTLES DRAWN AEROBIC AND ANAEROBIC Blood Culture adequate volume   Culture   Final    NO GROWTH 3 DAYS Performed at [facility], [address] [place], [name] [address]    Report Status PENDING  Incomplete  Aerobic/Anaerobic Culture w Gram Stain (surgical/deep wound)     Status: None (Preliminary result)   Collection Time: 10/29/23  3:45 PM   Specimen: Fine Needle Aspirate  Result Value Ref Range Status   Specimen Description NEEDLE ASPIRATE  Final   Special Requests INTERVERTEBRAL DISC  Final   Gram Stain NO WBC SEEN NO ORGANISMS SEEN   Final   Culture   Final    NO GROWTH 2 DAYS NO ANAEROBES ISOLATED; CULTURE IN PROGRESS FOR 5 DAYS Performed at [facility], [address], [place], [name] [address]    Report Status PENDING  Incomplete  Aerobic/Anaerobic Culture w Gram Stain (surgical/deep wound)     Status: None (Preliminary result)   Collection Time: 10/29/23  3:45 PM   Specimen: NEEDLE ASPIRATE; Abscess  Result Value Ref Range Status   Specimen Description NEEDLE ASPIRATE  Final   Special Requests INTERVERTEBRAL DISC  Final   Gram Stain NO WBC SEEN NO ORGANISMS SEEN   Final   Culture   Final    NO  GROWTH 2 DAYS NO ANAEROBES ISOLATED; CULTURE IN PROGRESS FOR 5 DAYS Performed at [facility], [address], [place], [name] [address]    Report Status PENDING  Incomplete    Radiology Studies: [place] EKG SITE RITE Result Date: 10/31/2023 If Site Rite image not attached, placement could not be confirmed due to current cardiac rhythm.  IR THORACIC DISC ASPIRATION W/IMG GUIDE Result Date: 10/29/2023 INDICATION: T9-10 discitis/osteomyelitis. EXAM: FLUOROSCOPY GUIDED T9-10 INTERVERTEBRAL DISC FINE-NEEDLE ASPIRATION MEDICATIONS: No antibiotics utilized. ANESTHESIA/SEDATION: Moderate (conscious) sedation was employed during this procedure. A total of Versed 2 mg and Fentanyl 100 mcg was administered intravenously by the radiology nurse. Total intra-service moderate Sedation Time: 22 minutes. The patient's level of consciousness and vital signs were monitored continuously by radiology nursing throughout the procedure under my direct supervision. COMPLICATIONS: None immediate. PROCEDURE: Informed written consent was obtained from the patient after a thorough discussion of the procedural risks, benefits and alternatives. All questions were addressed. Maximal Sterile Barrier Technique was utilized including caps, mask, sterile gowns, sterile gloves, sterile drape, hand hygiene and skin antiseptic. A timeout was performed prior to the initiation of the procedure. The patient was placed in prone position on the angiography table. The thoracic spine region was prepped and draped in a sterile fashion. Under fluoroscopy, the T9-10 disc space was delineated and the skin area was marked. The skin was infiltrated with a 1% Lidocaine approximately 6 cm lateral to the spinous process projection on the left. Using a 22-gauge spinal needle, the deep soft issue were infiltrated with Bupivacaine 0.5%. Subsequently, a 17 gauge needle was advanced under fluoroscopy guided into the T9-10 disc space. The mandrel was removed and  fine-needle aspiration pass performed with no yield. Then, approximately 2 mL of sterile saline were injected into the disc space and aspirated  back. Pain in case colored fluid was obtained. Samples were obtained. The needle was subsequently withdrawn. The access sites were cleaned and covered with a sterile bandage. IMPRESSION: T9-10 intervertebral disc aspiration performed. Samples obtained were sent for Gram stain and culture. Electronically Signed   By: Baldemar Lenis M.D.   On: 10/29/2023 16:47   Scheduled Meds:  aspirin EC  81 mg Oral q AM   atenolol  50 mg Oral Daily   celecoxib  200 mg Oral BID   chlorzoxazone  500 mg Oral QID   cyclobenzaprine  10 mg Oral TID   diazepam  5 mg Oral QHS   diphenhydrAMINE  50 mg Oral QID   ferrous sulfate  325 mg Oral q AM   gabapentin  1,200 mg Oral TID   lubiprostone  48 mcg Oral QAC lunch   methocarbamol  500 mg Oral QID   multivitamin with minerals  1 tablet Oral q AM   pantoprazole  40 mg Oral Daily   Continuous Infusions:  ceFEPime (MAXIPIME) IV 2 g (10/31/23 1021)    LOS: 3 days   Marguerita Merles, DO [facility] Available via Epic secure chat 7am-7pm After these hours, please refer to coverage provider listed on [email or web address] 10/31/2023, 3:13 PM

## 2023-10-31 NOTE — Progress Notes (Signed)
Patient ID: [name], female   DOB: [birth year], 62 y.o.   MRN: [id number] BP (!) 151/79 (BP Location: Left Arm)   Pulse 70   Temp 98.1 F (36.7 C)   Resp 18   Wt 101.6 kg   LMP 11/20/2012   SpO2 100%   BMI 40.97 kg/m  Alert and oriented x 4. Weak in right lower extremity. Also on left.  No cord stenosis in cervical spine. Stenosis in thoracic spine at T9/10. No operations contemplated until infection is treated. Each operation on the lumbar spine has been met with a wound infection. No altered cord signal appreciated.  No more hardware is needed if an operation is done in the future.

## 2023-10-31 NOTE — Evaluation (Signed)
Occupational Therapy Evaluation Patient Details Name: [name] MRN: [id number] DOB: 01/12/1961 Today's Date: 10/31/2023   History of Present Illness Pt is a 63 y/o F admitted on 10/28/23 after presenting with c/o ongoing intermittent numbness & weakness in all extremities. MRI concerning for T9 discitis/osteomyelitis & T9 epidural phlegmon/abscess with moderate thecal compression. PMH: morbid obesity, multiple spine sxs, chronic back pain, spine infection   Clinical Impression   Pt admitted for above, she has fibromyalgia and today c/o generalized pain. Pt displaying weakness in BUEs/BLEs and with ambulation her R knee buckles intermittently placing her as a high falls risk. Pt also needing a significant amount of assist with wiping and LB ADLs. Pt would benefit from continued acute skilled OT services to address listed deficits and help transition to next level of care. Patient has the potential to reach Mod I and demos the ability to tolerate 3 hours of therapy. Pt would benefit from an intensive rehab program to help maximize functional independence.        If plan is discharge home, recommend the following: A lot of help with walking and/or transfers;A lot of help with bathing/dressing/bathroom;Assistance with cooking/housework;Assist for transportation;Help with stairs or ramp for entrance    Functional Status Assessment  Patient has had a recent decline in their functional status and demonstrates the ability to make significant improvements in function in a reasonable and predictable amount of time.  Equipment Recommendations  Hospital bed (If DC Home)    Recommendations for Other Services Rehab consult     Precautions / Restrictions Precautions Precautions: Fall Precaution Comments: R knee buckles Restrictions Weight Bearing Restrictions Per Provider Order: No      Mobility Bed Mobility Overal bed mobility: Needs Assistance Bed Mobility: Supine to Sit     Supine to  sit: HOB elevated, Contact guard, Used rails     General bed mobility comments: requests HOB elevated to increase ease of movement    Transfers Overall transfer level: Needs assistance Equipment used: Rolling walker (2 wheels) Transfers: Sit to/from Stand Sit to Stand: Min assist           General transfer comment: education re: hand placement during STS with RW      Balance Overall balance assessment: Needs assistance, History of Falls Sitting-balance support: Feet supported Sitting balance-[name]: Fair Sitting balance - Comments: supervision static sitting   Standing balance support: During functional activity, Bilateral upper extremity supported, Reliant on assistive device for balance Standing balance-[name]: Poor                             ADL either performed or assessed with clinical judgement   ADL Overall ADL's : Needs assistance/impaired Eating/Feeding: Independent;Sitting   Grooming: Set up;Wash/dry face;Oral care;Sitting Grooming Details (indicate cue type and reason): sitting in recliner Upper Body Bathing: Sitting;Set up   Lower Body Bathing: Maximal assistance;Sitting/lateral leans   Upper Body Dressing : Sitting;Minimal assistance Upper Body Dressing Details (indicate cue type and reason): don gown like jacket Lower Body Dressing: Total assistance;Sitting/lateral leans   Toilet Transfer: Minimal assistance;Moderate assistance;Rolling walker (2 wheels);Stand-pivot Statistician Details (indicate cue type and reason): Min to Mod A, R knee buckling intermittently Toileting- Clothing Manipulation and Hygiene: Maximal assistance       Functional mobility during ADLs: Minimal assistance;Moderate assistance;Rolling walker (2 wheels) General ADL Comments: min to mod A for mobility, R knee buckling intermittently     Vision  Perception         Praxis         Pertinent Vitals/Pain Pain Assessment Pain Assessment:  Faces Faces Pain Scale: Hurts little more Pain Location: generalized Pain Descriptors / Indicators: Discomfort Pain Intervention(s): Monitored during session     Extremity/Trunk Assessment Upper Extremity Assessment Upper Extremity Assessment: Generalized weakness;RUE deficits/detail;Right hand dominant;LUE deficits/detail RUE Deficits / Details: two point discrimination not intact, feels sharper objects. Hx shoulder repairs, AROM 80* shoulder flexion. RUE Sensation: decreased light touch LUE Deficits / Details: Hx of shoulder repairs, AROM 65* shoulder flexion   Lower Extremity Assessment Lower Extremity Assessment: Generalized weakness RLE Deficits / Details: decreased proprioception RLE       Communication Communication Communication: No apparent difficulties   Cognition Arousal: Alert Behavior During Therapy: WFL for tasks assessed/performed Overall Cognitive Status: Within Functional Limits for tasks assessed                                       General Comments       Exercises     Shoulder Instructions      Home Living Family/patient expects to be discharged to:: Private residence Living Arrangements: Alone Available Help at Discharge: Family;Friend(s);Available PRN/intermittently Type of Home: House Home Access: Stairs to enter [employer] of Steps: 5 Entrance Stairs-Rails: Right;Left;Can reach both Home Layout: One level     Bathroom Shower/Tub: Chief Strategy Officer: Standard     Home Equipment: Shower seat;Hand held Programmer, systems (2 wheels);Rollator (4 wheels);Grab bars - tub/shower;Cane - single point;Adaptive equipment;BSC/3in1;Crutches Adaptive Equipment: Reacher;Long-handled sponge Additional Comments: Pt notes a few falls in the last month since coming off antibiotics. BLE numbness and R knee buckling. Recieves HHPT/HHOT      Prior Functioning/Environment Prior Level of Function : History of  Falls (last six months);Driving;Needs assist       Physical Assist : ADLs (physical)   ADLs (physical): Dressing;Bathing Mobility Comments: Mod I with rollator, cane or crutches ADLs Comments: gets groceries delievered, nephew has been helping with dressing/bathing recently        OT Problem List: Decreased strength;Impaired balance (sitting and/or standing);Increased edema;Obesity;Pain      OT Treatment/Interventions: Self-care/ADL training;Balance training;Therapeutic exercise;Therapeutic activities;Patient/family education;DME and/or AE instruction    OT Goals(Current goals can be found in the care plan section) Acute Rehab OT Goals Patient Stated Goal: To get better, go home OT Goal Formulation: With patient Time For Goal Achievement: 11/14/23 Potential to Achieve Goals: Good  OT Frequency: Min 1X/week    Co-evaluation PT/OT/SLP Co-Evaluation/Treatment: Yes Reason for Co-Treatment: Complexity of the patient's impairments (multi-system involvement);For patient/therapist safety PT goals addressed during session: Mobility/safety with mobility;Balance;Proper use of DME OT goals addressed during session: ADL's and self-care;Proper use of Adaptive equipment and DME      AM-PAC OT "6 Clicks" Daily Activity     Outcome Measure Help from another person eating meals?: None Help from another person taking care of personal grooming?: A Little Help from another person toileting, which includes using toliet, bedpan, or urinal?: A Lot Help from another person bathing (including washing, rinsing, drying)?: A Lot Help from another person to put on and taking off regular upper body clothing?: A Little Help from another person to put on and taking off regular lower body clothing?: Total 6 Click Score: 15   End of Session Equipment Utilized During Treatment: Rolling walker (2 wheels)  Nurse Communication: Mobility status  Activity Tolerance: Patient tolerated treatment well Patient left:  in chair;with call bell/phone within reach  OT Visit Diagnosis: Pain;Other abnormalities of gait and mobility (R26.89) Pain - Right/Left:  (generalized)                Time: 6045-4098 OT Time Calculation (min): 51 min Charges:  OT General Charges $OT Visit: 1 Visit OT Evaluation $OT Eval Moderate Complexity: 1 Mod  10/31/2023  AB, OTR/L  Acute Rehabilitation Services  Office: [phone]   Tristan Schroeder 10/31/2023, 1:15 PM

## 2023-10-31 NOTE — Progress Notes (Signed)
PHARMACY CONSULT NOTE FOR:  OUTPATIENT  PARENTERAL ANTIBIOTIC THERAPY (OPAT)  Indication: Discitis/osteomyelitis  Regimen: Cefepime 2 gm IV Q 8 hours  End date: 12/11/2023  IV antibiotic discharge orders are pended. To discharging provider:  please sign these orders via discharge navigator,  Select New Orders & click on the button choice - Manage This Unsigned Work.     Thank you for allowing pharmacy to be a part of this patient's care.  Sharin Mons, PharmD, BCPS, BCIDP Infectious Diseases Clinical Pharmacist Phone: [phone] 10/31/2023, 11:08 AM

## 2023-10-31 NOTE — Progress Notes (Signed)
Consent obtained for bedside PICC placement. Pt requested placement for 1-25 AM. RN notified.

## 2023-11-01 DIAGNOSIS — I10 Essential (primary) hypertension: Secondary | ICD-10-CM | POA: Diagnosis not present

## 2023-11-01 DIAGNOSIS — M4644 Discitis, unspecified, thoracic region: Secondary | ICD-10-CM | POA: Diagnosis not present

## 2023-11-01 DIAGNOSIS — Z79899 Other long term (current) drug therapy: Secondary | ICD-10-CM | POA: Diagnosis not present

## 2023-11-01 LAB — CBC WITH DIFFERENTIAL/PLATELET
Abs Immature Granulocytes: 0.02 10*3/uL (ref 0.00–0.07)
Basophils Absolute: 0 10*3/uL (ref 0.0–0.1)
Basophils Relative: 1 %
Eosinophils Absolute: 0.5 10*3/uL (ref 0.0–0.5)
Eosinophils Relative: 7 %
HCT: 37.4 % (ref 36.0–46.0)
Hemoglobin: 11.6 g/dL — ABNORMAL LOW (ref 12.0–15.0)
Immature Granulocytes: 0 %
Lymphocytes Relative: 23 %
Lymphs Abs: 1.6 10*3/uL (ref 0.7–4.0)
MCH: 26.4 pg (ref 26.0–34.0)
MCHC: 31 g/dL (ref 30.0–36.0)
MCV: 85.2 fL (ref 80.0–100.0)
Monocytes Absolute: 0.9 10*3/uL (ref 0.1–1.0)
Monocytes Relative: 13 %
Neutro Abs: 3.8 10*3/uL (ref 1.7–7.7)
Neutrophils Relative %: 56 %
Platelets: 248 10*3/uL (ref 150–400)
RBC: 4.39 MIL/uL (ref 3.87–5.11)
RDW: 12.8 % (ref 11.5–15.5)
WBC: 6.7 10*3/uL (ref 4.0–10.5)
nRBC: 0 % (ref 0.0–0.2)

## 2023-11-01 LAB — COMPREHENSIVE METABOLIC PANEL
ALT: 17 U/L (ref 0–44)
AST: 17 U/L (ref 15–41)
Albumin: 2.9 g/dL — ABNORMAL LOW (ref 3.5–5.0)
Alkaline Phosphatase: 91 U/L (ref 38–126)
Anion gap: 8 (ref 5–15)
BUN: 23 mg/dL (ref 8–23)
CO2: 26 mmol/L (ref 22–32)
Calcium: 8.8 mg/dL — ABNORMAL LOW (ref 8.9–10.3)
Chloride: 104 mmol/L (ref 98–111)
Creatinine, Ser: 0.79 mg/dL (ref 0.44–1.00)
GFR, Estimated: >60 mL/min (ref >60)
Glucose, Bld: 95 mg/dL (ref 70–99)
Potassium: 3.9 mmol/L (ref 3.5–5.1)
Sodium: 138 mmol/L (ref 135–145)
Total Bilirubin: 0.2 mg/dL (ref 0.0–1.2)
Total Protein: 5.3 g/dL — ABNORMAL LOW (ref 6.5–8.1)

## 2023-11-01 LAB — MAGNESIUM: Magnesium: 2 mg/dL (ref 1.7–2.4)

## 2023-11-01 LAB — PHOSPHORUS: Phosphorus: 3.7 mg/dL (ref 2.5–4.6)

## 2023-11-01 MED ORDER — ONDANSETRON HCL 4 MG PO TABS: 4.0000 mg | ORAL_TABLET | Freq: Four times a day (QID) | ORAL | 0 refills | Status: DC | PRN

## 2023-11-01 MED ORDER — CHLORHEXIDINE GLUCONATE CLOTH 2 % EX PADS
6.0000 | MEDICATED_PAD | Freq: Every day | CUTANEOUS | Status: DC
Start: 2023-11-01 — End: 2023-11-02
  Administered 2023-11-01: 6 via TOPICAL

## 2023-11-01 MED ORDER — HEPARIN SOD (PORK) LOCK FLUSH 100 UNIT/ML IV SOLN: 250.0000 [IU] | INTRAVENOUS | Status: DC | PRN

## 2023-11-01 MED ORDER — SODIUM CHLORIDE 0.9% FLUSH: 10.0000 mL | INTRAVENOUS | Status: DC | PRN

## 2023-11-01 MED ORDER — CEFEPIME IV (FOR PTA / DISCHARGE USE ONLY): 2.0000 g | Freq: Three times a day (TID) | INTRAVENOUS | 0 refills | Status: AC

## 2023-11-01 MED ORDER — ACETAMINOPHEN 325 MG PO TABS: 650.0000 mg | ORAL_TABLET | Freq: Four times a day (QID) | ORAL | 0 refills | Status: DC | PRN

## 2023-11-01 NOTE — Progress Notes (Signed)
Patient states her ride is delayed. He has to go by her house and find clothing.

## 2023-11-01 NOTE — Discharge Summary (Signed)
Physician Discharge Summary   Patient: [name] MRN: [id number] DOB: 04/16/1961  Admit date:     10/28/2023  Discharge date: 11/01/23  Discharge Physician: Marguerita Merles, DO   PCP: Irven Coe, MD   Recommendations at discharge:   Follow-up with PCP within 1 to 2 weeks and repeat CBC, CMP, mag, Phos within 1 week Follow-up with infectious disease in outpatient setting with PICC line and antibiotics with IV cefepime with end date 12/11/23 she will need weekly labs while on IV antibiotics with a CBC with differential, CMP, CRP, ESR and will need to have a PICC line in place until she is seen by physician or has been notified; weekly labs need to be faxed to [phone] Follow-up with Neurosurgery Dr. Franky Macho in outpatient setting  Discharge Diagnoses: Principal Problem:   Diskitis Active Problems:   Hypertension   Polypharmacy  Resolved Problems:   * No resolved hospital problems. 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  Subsequently she has been about a year and a half on Keflex before Dr. Drue Second decided to try her off of Keflex to see if there is any recurrence.  She went off of antibiotics about late May 2022 and did not have any recurrence of MSSA.  Subsequently she had surgical screw loosening back pain and ultimately he did surgery in June 2023.  This time is unfortunate complicated by postop wound infection sepsis and wound culture that grew out Enterobacter cloacae.  She is subsequently then started on Bactrim for a year and a half but that she thought she is having allergies and side effects given an acid medicine rash and headache was was itching so she elected to stop Bactrim in December 2024.  For last few weeks she has been  having numbness in upper extremities and numbness and weakness in the bilateral extremities with no new bowel or bladder incontinence.  MRI of 1 9 of the L-spine did not show any findings of cord compression or infection then MRI in the ED on 10/28/2023 of the C and T-spine does show findings concerning for T9 discitis/osteomyelitis and T9 epidural phlegmon/abscess with moderate thecal compression.  Neurosurgery was consulted and recommended holding off starting antibiotics for the moment and ID was also consulted.  IR was also consulted for aspiration/biopsy and Dr. Tommie Sams approved the procedure for aspiration.  Aspiration done and Cx showing NGTD. Now ID started the patient on IV Cefepime and recommending the patient have a prolonged course of antibiotics with IV cefepime. PT/OT recommending CIR low patient is declined.  PICC line has now been placed and OPAT orders have been done and she is medically stable for discharge at this time to be discharged home with home health PT OT.   Assessment and Plan:  T9 Discitis/Osteomyelitis with associated LE Weakness and Numbness -Pt with discitis, phlegmon, and moderate thecal sac impingement at T9. -Suspicious that source may be recurrence of a suppressed chronic spinal hardware infection in setting of pt taking herself off on bactrim in Dec.   -No other obvious sources this time around (ie open wounds, recent surgeries, etc). -Potential organisms at the top of my suspect list include enterobacter cloacae (2023 culture) and MSSA (2021 culture). -No SIRS or suggestion of systemic involvement at this  point. -Dr. Julian Reil discussed with Neurosurgery who recommended holding off on starting ABx for the moment to allow for best chance of culture positive -Most likely plan will be IR aspiration/biopsy for cultures; IR consulted and Dr. Tommie Sams approved the procedure -Dr. Julian Reil asked given MRI L spine on 1/9: They do want repeat MRI L spine  (with and without contrast) as well as T spine (with contrast, already has without today) to be ordered and it showed  -EDP also notified ID of pts admission and they evaluated; she has a known history of MSSA and Enterobacter Cloacea and did well on Bactrim suppression until recently when she came in with excess rashes on her elbows and was attributing this to Bactrim. -Blood Cx x2 show NGTD at 3 Days -Continue with Celecoxib 200 mg po BID, Chlorzoxazone 500 mg po 4 times daily, and methocarbamol 500 mg po 4 times daily -C/w Gabapentin mg po TID -Inflammatory Markers are not elevated  -ID consulted and cultures obtained and plan on starting cefepime IV after aspiration; Now on Day 2 and per ID they are planning on a prolonged course with 6 weeks of IV antibiotics with IV cefepime and there is no positive growth -Given that she has no growth infectious disease recommending continuing IV cefepime based on her previous Enterobacter growth as well as MSSA and PICC line is being ordered with OPAT orders being placed and recommendation for IV cefepime 2 g every 8 hours with the end date being December 11, 2023 -Need PT OT to further evaluate and treat given her lower extremity weakness and numbness and they are recommending CIR but patient is declining.  She is medically stable to be discharged home now that PICC line and antibiotics and placed.  Hyperglycemia  -Check HbA1c in the AM; Last HbA1c was 6.0 in our system -If necessary will place on sensitive NovoLog/scale insulin before meals and at bedtime -Glucose Level Trend:  Recent Labs  Lab 10/16/23 1739 10/28/23 1240 10/29/23 0518 10/30/23 0508 10/31/23 0538 11/01/23 0420  GLUCOSE 109* 104* 107* 91 107* 95    Polypharmacy -Will need Caution with her multiple meds, will continue home meds for the moment.  Hyponatremia -Na+ Trend: Recent Labs  Lab 10/16/23 1739 10/28/23 1240 10/29/23 0518 10/30/23 0508 10/31/23 0538 11/01/23 0420  NA 141  140 139 133* 139 138  -Continue to Monitor and Trend and repeat CMP in the AM   Hypertension -Continue Atenolol 50 mg po daily -Will hold Hydrochlorothiazide  -Continue to Monitor BP per Protocol -Last BP reading was 121/63  GERD/GI Prophylaxis -C/w PPI with Pantoprazole 40 mg po daily   Hypoalbuminemia -Patient's Albumin Trend: Recent Labs  Lab 10/28/23 1240 10/30/23 0508 10/31/23 0538 11/01/23 0420  ALBUMIN 3.7 3.2* 3.0* 2.9*  -Continue to Monitor and Trend and repeat CMP in the AM'  Class III Obesity (Morbid) -Complicates overall prognosis and care -Estimated body mass index is 40.97 kg/m as calculated from the following:   Height as of 09/23/23: 5\' 2"  (1.575 m).   Weight as of this encounter: 101.6 kg.  -Weight Loss and Dietary Counseling given  Consultants: Neurosurgery, infectious diseases Procedures performed: As delineated as above Disposition: Home health as she has declined inpatient rehabilitation Diet recommendation:  Discharge Diet Orders (From admission, onward)     Start     Ordered   11/01/23 0000  Diet - low sodium heart healthy        11/01/23 1316  Cardiac diet DISCHARGE MEDICATION: Allergies as of 11/01/2023       Reactions   Monosodium Glutamate Anaphylaxis, Swelling   Eyes swollen shut, facial swelling, tongue swelling.   Shellfish Allergy Anaphylaxis   Baclofen Nausea Only   Dizziness and increase muscle spasm   Celebrex [celecoxib] Itching   Only allergic to generic brand   Contrast Media [iodinated Contrast Media] Itching, Nausea Only   "could not walk"   Diclofenac Itching   Generic Diclefenac gel causes itching. Can take the name brand Voltaren gel   Gadolinium    Allergic to MRI contrast dye per patient.   Other Other (See Comments)   Pet dander,  Green rubber piece to hold mouth open (oval shape)   Latex Itching, Rash   Latex glove with powder        Medication List     STOP taking these medications     CeleBREX 200 MG capsule Generic drug: celecoxib   hydrochlorothiazide 25 MG tablet Commonly known as: HYDRODIURIL   Systane Complete 0.6 % Soln Generic drug: Propylene Glycol       TAKE these medications    acetaminophen 325 MG tablet Commonly known as: TYLENOL Take 2 tablets (650 mg total) by mouth every 6 (six) hours as needed for mild pain (pain score 1-3) (or Fever >/= 101).   albuterol 108 (90 Base) MCG/ACT inhaler Commonly known as: VENTOLIN HFA Inhale 2 puffs into the lungs every 6 (six) hours as needed for wheezing or shortness of breath.   ascorbic acid 500 MG tablet Commonly known as: VITAMIN C Take 500 mg by mouth daily.   aspirin EC 81 MG tablet Take 81 mg by mouth in the morning.   atenolol 50 MG tablet Commonly known as: TENORMIN Take 1 tablet (50 mg total) by mouth daily. What changed: when to take this   CALCIUM-MAGNESIUM-ZINC-D3 PO Take 1 tablet by mouth in the morning, at noon, and at bedtime.   ceFEPime IVPB Commonly known as: MAXIPIME Inject 2 g into the vein every 8 (eight) hours. Indication:  Discitis/osteomyelitis  First Dose: Yes Last Day of Therapy:  12/11/23 Labs - Once weekly:  CBC/D and BMP, Labs - Once weekly: ESR and CRP Method of administration: IV Push Method of administration may be changed at the discretion of home infusion pharmacist based upon assessment of the patient and/or caregiver's ability to self-administer the medication ordered.   chlorzoxazone 500 MG tablet Commonly known as: PARAFON Take 1 tablet (500 mg total) by mouth 3 (three) times daily. What changed: when to take this   cyanocobalamin 1000 MCG tablet Commonly known as: VITAMIN B12 Take 1,000 mcg by mouth once a week.   cyclobenzaprine 10 MG tablet Commonly known as: FLEXERIL Take 10 mg by mouth 3 (three) times daily.   diazepam 5 MG tablet Commonly known as: VALIUM Take 1 tablet (5 mg total) by mouth 2 (two) times daily. What changed:  when to take  this additional instructions   diclofenac Sodium 1 % Gel Commonly known as: VOLTAREN Apply 2 g topically 4 (four) times daily as needed (pain).   diphenhydrAMINE 25 MG tablet Commonly known as: BENADRYL Take 50 mg by mouth in the morning, at noon, in the evening, and at bedtime.   ferrous sulfate 325 (65 FE) MG EC tablet Take 325 mg by mouth in the morning.   gabapentin 600 MG tablet Commonly known as: NEURONTIN Take 2 tablets (1,200 mg total) by mouth 3 (three) times daily.  hydrOXYzine 50 MG tablet Commonly known as: ATARAX Take 50 mg by mouth 2 (two) times daily as needed for anxiety (sleep).   lubiprostone 24 MCG capsule Commonly known as: Amitiza Take 1 capsule (24 mcg total) by mouth 2 (two) times daily with a meal. What changed:  how much to take when to take this   methocarbamol 500 MG tablet Commonly known as: ROBAXIN Take 500 mg by mouth 4 (four) times daily.   multivitamin with minerals Tabs tablet Take 1 tablet by mouth in the morning.   Narcan 4 MG/0.1ML Liqd nasal spray kit Generic drug: naloxone Place 1 spray into the nose once.   omeprazole 20 MG capsule Commonly known as: PRILOSEC Take 20 mg by mouth in the morning and at bedtime.   ondansetron 4 MG tablet Commonly known as: ZOFRAN Take 1 tablet (4 mg total) by mouth every 6 (six) hours as needed for nausea.   oxyCODONE-acetaminophen 10-325 MG tablet Commonly known as: PERCOCET Take 1 tablet by mouth every 6 (six) hours as needed for pain. What changed: when to take this   senna 8.6 MG Tabs tablet Commonly known as: SENOKOT Take 2 tablets by mouth daily before lunch. Take with Amitiza   VITAMIN D-3 PO Take 1 tablet by mouth in the morning.               Discharge Care Instructions  (From admission, onward)           Start     Ordered   11/01/23 0000  Change dressing on IV access line weekly and PRN  (Home infusion instructions - [facility] )        11/01/23  1311            Follow-up Information     [facility] Follow up.   Specialty: Home Health Services Why: Home health has been arranged. They will contact you to schedule apt within 48hrs post discharge. Contact information: [address] [name] [address] [phone]                Discharge Exam: Ceasar Mons Weights   10/29/23 1455  Weight: 101.6 kg   Vitals:   11/01/23 0749 11/01/23 1646  BP: (!) 107/90 121/63  Pulse: 70 72  Resp:  16  Temp: 98 F (36.7 C) 98.6 F (37 C)  SpO2: 100%    Examination: Physical Exam:  Constitutional: WN/WD morbidly obese African-American female in no acute distress Respiratory: Diminished to auscultation bilaterally, no wheezing, rales, rhonchi or crackles. Normal respiratory effort and patient is not tachypenic. No accessory muscle use.  Unlabored breathing Cardiovascular: RRR, no murmurs / rubs / gallops. S1 and S2 auscultated.  No appreciable extremity edema Abdomen: Soft, non-tender, distended secondary to body habitus. Bowel sounds positive.  GU: Deferred. Musculoskeletal: No clubbing / cyanosis of digits/nails. No joint deformity upper and lower extremities Skin: No rashes, lesions, ulcers limited skin evaluation. No induration; Warm and dry.  Neurologic: CN 2-12 grossly intact with no focal deficits.  Romberg sign and cerebellar reflexes not assessed.  Psychiatric: Normal judgment and insight. Alert and oriented x 3. Normal mood and appropriate affect.   Condition at discharge: stable  The results of significant diagnostics from this hospitalization (including imaging, microbiology, ancillary and laboratory) are listed below for reference.   Imaging Studies: [place] EKG SITE RITE Result Date: 10/31/2023 If Site Rite image not attached, placement could not be confirmed due to current cardiac rhythm.  IR THORACIC DISC ASPIRATION W/IMG GUIDE Result Date: 10/29/2023 INDICATION: T9-10  discitis/osteomyelitis. EXAM: FLUOROSCOPY GUIDED T9-10 INTERVERTEBRAL DISC FINE-NEEDLE ASPIRATION MEDICATIONS: No antibiotics utilized. ANESTHESIA/SEDATION: Moderate (conscious) sedation was employed during this procedure. A total of Versed 2 mg and Fentanyl 100 mcg was administered intravenously by the radiology nurse. Total intra-service moderate Sedation Time: 22 minutes. The patient's level of consciousness and vital signs were monitored continuously by radiology nursing throughout the procedure under my direct supervision. COMPLICATIONS: None immediate. PROCEDURE: Informed written consent was obtained from the patient after a thorough discussion of the procedural risks, benefits and alternatives. All questions were addressed. Maximal Sterile Barrier Technique was utilized including caps, mask, sterile gowns, sterile gloves, sterile drape, hand hygiene and skin antiseptic. A timeout was performed prior to the initiation of the procedure. The patient was placed in prone position on the angiography table. The thoracic spine region was prepped and draped in a sterile fashion. Under fluoroscopy, the T9-10 disc space was delineated and the skin area was marked. The skin was infiltrated with a 1% Lidocaine approximately 6 cm lateral to the spinous process projection on the left. Using a 22-gauge spinal needle, the deep soft issue were infiltrated with Bupivacaine 0.5%. Subsequently, a 17 gauge needle was advanced under fluoroscopy guided into the T9-10 disc space. The mandrel was removed and fine-needle aspiration pass performed with no yield. Then, approximately 2 mL of sterile saline were injected into the disc space and aspirated back. Pain in case colored fluid was obtained. Samples were obtained. The needle was subsequently withdrawn. The access sites were cleaned and covered with a sterile bandage. IMPRESSION: T9-10 intervertebral disc aspiration performed. Samples obtained were sent for Gram stain and culture.  Electronically Signed   By: Baldemar Lenis M.D.   On: 10/29/2023 16:47   MR LUMBAR SPINE WO CONTRAST Result Date: 10/29/2023 CLINICAL DATA:  Initial evaluation for known spinal infection within the lower thoracic spine, evaluate for lumbar spine involvement. EXAM: MRI LUMBAR SPINE WITHOUT CONTRAST TECHNIQUE: Multiplanar, multisequence MR imaging of the lumbar spine was performed. No intravenous contrast was administered. COMPARISON:  Prior study from 10/16/2023 FINDINGS: Segmentation: Standard. Lowest well-formed disc space labeled the L5-S1 level. Alignment: Levoscoliosis. Chronic grade 1 retrolisthesis of L1 on L2, with grade 1 anterolisthesis of L4 on L5, stable. Vertebrae: Extensive susceptibility artifact related to prior posterior fusion extending from T11 through L5. Prior ALIF at L5-S1. vertebral body height maintained without acute or interval fracture. Bone marrow signal intensity within normal limits. No evidence for acute infection within the lumbar spine. No worrisome osseous lesions. Conus medullaris and cauda equina: Conus extends to the L2 level. Conus and cauda equina are grossly unremarkable, though evaluation limited by artifact. Paraspinal and other soft tissues: Chronic postoperative scarring within the posterior paraspinous soft tissues. No acute finding. Disc levels: Underlying degenerative spondylosis with postoperative changes, stable as compared to recent MRI from 10/15/2018 fibroid these findings are fully described. No new abnormality. IMPRESSION: 1. No evidence for acute infection within the lumbar spine. 2. Postoperative changes related to prior posterior fusion extending from T11 through L5, with ALIF at L5-S1. Findings are stable as compared to recent MRI from 10/16/2023. 3. Underlying degenerative spondylosis, stable as compared to recent MRI from 10/16/2023 where these changes are fully described. Electronically Signed   By: Rise Mu M.D.   On:  10/29/2023 03:28   MR Cervical Spine Wo Contrast Result Date: 10/28/2023 CLINICAL DATA:  Bilateral upper and lower extremity weakness and numbness  EXAM: MRI CERVICAL AND THORACIC SPINE WITHOUT CONTRAST TECHNIQUE: Multiplanar and multiecho pulse sequences of the cervical spine, to include the craniocervical junction and cervicothoracic junction, and the thoracic spine, were obtained without intravenous contrast. COMPARISON:  04/16/2022 MRI cervical and thoracic spine FINDINGS: MRI CERVICAL SPINE FINDINGS Alignment: Straightening of the normal cervical lordosis. 3 mm anterolisthesis of C7 on T1, unchanged. Vertebrae: No acute fracture, evidence of discitis, or suspicious osseous lesion. ACDF C3-C7, likely with solid arthrodesis. Cord: Redemonstrated chronic myelomalacia in the anterior right spinal cord at C6-C7. Otherwise normal signal and morphology. Posterior Fossa, vertebral arteries, paraspinal tissues: No acute finding. Disc levels: C2-C3: Mild disc bulge. Facet and uncovertebral hypertrophy. No spinal canal stenosis. Mild left neural foraminal narrowing, new from the prior exam. C3-C4: Prior fusion. Left-greater-than-right facet arthropathy. No spinal canal stenosis. Mild left neural foraminal narrowing, new from the prior exam. C4-C5: Prior fusion. No residual spinal canal stenosis. Facet arthropathy. Mild left neural foraminal narrowing, similar to prior. C5-C6: Status post fusion. Facet arthropathy. No residual spinal canal stenosis. Moderate right neural foraminal narrowing, unchanged. C6-C7: Status post fusion. Facet arthropathy. No spinal canal stenosis or neural foraminal narrowing. C7-T1: Anterolisthesis with disc unroofing. Facet arthropathy. No spinal canal stenosis. Moderate bilateral neural foraminal narrowing, unchanged. MRI THORACIC SPINE FINDINGS Alignment:  No significant listhesis. Vertebrae: Fluid in the disc space at T9-T10, which is new compared to the 04/16/2022 exam. Some increased T2  signal is also noted in the endplates at T9-T10. Solid osseous fusion of T8 and T9. Redemonstrated posterior fusion extending from T10 through the lumbar spine, without definite solid arthrodesis. No evidence of acute fracture. Cord: Evaluation of the spinal cord from T9 through the lumbar spine is limited by susceptibility artifact from the hardware. Within this limitation, the visualized spinal cord is normal in signal and morphology. Possible epidural material at T9-T10 (series 12, image 28), which causes thecal sac narrowing. Paraspinal and other soft tissues: No acute finding. Postsurgical changes in the soft tissues posterior to T9-L4 Disc levels: Moderate thecal sac narrowing at T9-T10, in part secondary to suspected epidural material (series 12, images 27-29), which may represent phlegmon, although evaluation is limited by the absence of intravenous contrast. No other spinal canal stenosis is seen. Redemonstrated moderate right neural foraminal narrowing at T1-T2, mild bilateral neural foraminal narrowing at T3-T4, and mild left neural foraminal narrowing at T9-T10. Evaluation of the lower thoracic neural foramina is limited by susceptibility artifact, particularly on the left. IMPRESSION: CERVICAL SPINE: 1. C3-C7 ACDF, likely with solid arthrodesis. No residual spinal canal stenosis. Mild left neural foraminal narrowing at C3-C4 and C4-C5, new from the prior exam. 2. C5-C6 moderate right neural foraminal narrowing, unchanged. 3. C7-T1 moderate bilateral neural foraminal narrowing, unchanged. 4. Redemonstrated chronic myelomalacia in the anterior right spinal cord at C6-C7. THORACIC SPINE: 1. Fluid in the disc space at T9-T10, which is new compared to the 04/16/2022 exam, with some increased T2 signal in the endplates at T9-T10. These findings are concerning for discitis/osteomyelitis. 2. Moderate thecal sac narrowing at T9-T10, in part secondary to suspected epidural material, which may represent phlegmon,  although evaluation is limited by the absence of intravenous contrast. 3. Redemonstrated posterior fusion extending from T10 through the lumbar spine, without definite solid arthrodesis. 4. Redemonstrated moderate right neural foraminal narrowing at T1-T2, mild bilateral neural foraminal narrowing at T3-T4, and mild left neural foraminal narrowing at T9-T10. These results were called by telephone at the time of interpretation on 10/28/2023 at 7:22 pm to provider  ROBERT PATERSON , who verbally acknowledged these results. Electronically Signed   By: Wiliam Ke M.D.   On: 10/28/2023 19:23   MR THORACIC SPINE WO CONTRAST Result Date: 10/28/2023 CLINICAL DATA:  Bilateral upper and lower extremity weakness and numbness EXAM: MRI CERVICAL AND THORACIC SPINE WITHOUT CONTRAST TECHNIQUE: Multiplanar and multiecho pulse sequences of the cervical spine, to include the craniocervical junction and cervicothoracic junction, and the thoracic spine, were obtained without intravenous contrast. COMPARISON:  04/16/2022 MRI cervical and thoracic spine FINDINGS: MRI CERVICAL SPINE FINDINGS Alignment: Straightening of the normal cervical lordosis. 3 mm anterolisthesis of C7 on T1, unchanged. Vertebrae: No acute fracture, evidence of discitis, or suspicious osseous lesion. ACDF C3-C7, likely with solid arthrodesis. Cord: Redemonstrated chronic myelomalacia in the anterior right spinal cord at C6-C7. Otherwise normal signal and morphology. Posterior Fossa, vertebral arteries, paraspinal tissues: No acute finding. Disc levels: C2-C3: Mild disc bulge. Facet and uncovertebral hypertrophy. No spinal canal stenosis. Mild left neural foraminal narrowing, new from the prior exam. C3-C4: Prior fusion. Left-greater-than-right facet arthropathy. No spinal canal stenosis. Mild left neural foraminal narrowing, new from the prior exam. C4-C5: Prior fusion. No residual spinal canal stenosis. Facet arthropathy. Mild left neural foraminal narrowing,  similar to prior. C5-C6: Status post fusion. Facet arthropathy. No residual spinal canal stenosis. Moderate right neural foraminal narrowing, unchanged. C6-C7: Status post fusion. Facet arthropathy. No spinal canal stenosis or neural foraminal narrowing. C7-T1: Anterolisthesis with disc unroofing. Facet arthropathy. No spinal canal stenosis. Moderate bilateral neural foraminal narrowing, unchanged. MRI THORACIC SPINE FINDINGS Alignment:  No significant listhesis. Vertebrae: Fluid in the disc space at T9-T10, which is new compared to the 04/16/2022 exam. Some increased T2 signal is also noted in the endplates at T9-T10. Solid osseous fusion of T8 and T9. Redemonstrated posterior fusion extending from T10 through the lumbar spine, without definite solid arthrodesis. No evidence of acute fracture. Cord: Evaluation of the spinal cord from T9 through the lumbar spine is limited by susceptibility artifact from the hardware. Within this limitation, the visualized spinal cord is normal in signal and morphology. Possible epidural material at T9-T10 (series 12, image 28), which causes thecal sac narrowing. Paraspinal and other soft tissues: No acute finding. Postsurgical changes in the soft tissues posterior to T9-L4 Disc levels: Moderate thecal sac narrowing at T9-T10, in part secondary to suspected epidural material (series 12, images 27-29), which may represent phlegmon, although evaluation is limited by the absence of intravenous contrast. No other spinal canal stenosis is seen. Redemonstrated moderate right neural foraminal narrowing at T1-T2, mild bilateral neural foraminal narrowing at T3-T4, and mild left neural foraminal narrowing at T9-T10. Evaluation of the lower thoracic neural foramina is limited by susceptibility artifact, particularly on the left. IMPRESSION: CERVICAL SPINE: 1. C3-C7 ACDF, likely with solid arthrodesis. No residual spinal canal stenosis. Mild left neural foraminal narrowing at C3-C4 and C4-C5,  new from the prior exam. 2. C5-C6 moderate right neural foraminal narrowing, unchanged. 3. C7-T1 moderate bilateral neural foraminal narrowing, unchanged. 4. Redemonstrated chronic myelomalacia in the anterior right spinal cord at C6-C7. THORACIC SPINE: 1. Fluid in the disc space at T9-T10, which is new compared to the 04/16/2022 exam, with some increased T2 signal in the endplates at T9-T10. These findings are concerning for discitis/osteomyelitis. 2. Moderate thecal sac narrowing at T9-T10, in part secondary to suspected epidural material, which may represent phlegmon, although evaluation is limited by the absence of intravenous contrast. 3. Redemonstrated posterior fusion extending from T10 through the lumbar spine, without definite solid  arthrodesis. 4. Redemonstrated moderate right neural foraminal narrowing at T1-T2, mild bilateral neural foraminal narrowing at T3-T4, and mild left neural foraminal narrowing at T9-T10. These results were called by telephone at the time of interpretation on 10/28/2023 at 7:22 pm to provider [facility] , who verbally acknowledged these results. Electronically Signed   By: Wiliam Ke M.D.   On: 10/28/2023 19:23   MR LUMBAR SPINE WO CONTRAST Result Date: 10/16/2023 CLINICAL DATA:  Initial evaluation for low back pain. EXAM: MRI LUMBAR SPINE WITHOUT CONTRAST TECHNIQUE: Multiplanar, multisequence MR imaging of the lumbar spine was performed. No intravenous contrast was administered. COMPARISON:  Prior study from 03/17/2022. FINDINGS: Segmentation: Standard. Lowest well-formed disc space labeled the L5-S1 level. Alignment: Levoscoliosis. Chronic grade 1 retrolisthesis of L1 on L2, with grade 1 anterolisthesis of L4 on L5, stable. Vertebrae: Extensive susceptibility artifact related to prior posterior fusion extending from T11 through L5. Prior ALIF at L5-S1. vertebral body height maintained without acute or chronic fracture. Bone marrow signal intensity within normal limits.  No abnormal marrow edema. Conus medullaris and cauda equina: Conus extends to the L2 level. Conus and cauda equina are grossly unremarkable, though evaluation limited by susceptibility artifact. Paraspinal and other soft tissues: Chronic postoperative scarring present within the posterior paraspinous soft tissues. No acute finding. Disc levels: L1-2: Advanced degenerative intervertebral disc space narrowing with diffuse disc bulge and reactive endplate spurring, asymmetric to the left. Moderate facet hypertrophy. Prior posterior fusion. Residual mild spinal stenosis. Mild bilateral L1 foraminal narrowing. L2-3: Diffuse disc bulge with reactive endplate spurring, asymmetric to the right. Prior posterior fusion and decompression. Residual mild narrowing of the right lateral recess. Central canal remains patent. Suspected moderate bilateral L2 foraminal stenosis. L3-4: Mild disc bulge with disc desiccation. Prior posterior decompression and fusion. No residual spinal stenosis. Mild left with mild to moderate right L3 foraminal narrowing. L4-5: Anterolisthesis. Mild disc bulge with disc desiccation. Prior posterior decompression with fusion. No residual spinal stenosis. Mild right L4 foraminal narrowing. Left neural foramina remains patent. L5-S1: Prior fusion. Central endplate spur indents the ventral epidural fat. Mild facet hypertrophy. No significant spinal stenosis. Foramina are patent. IMPRESSION: 1. No acute abnormality within the lumbar spine. 2. Prior posterior fusion extending from T11 through L5, with ALIF at L5-S1. 3. Degenerative spondylosis at L1-2 with resultant mild spinal stenosis, with mild bilateral L1 foraminal narrowing. 4. Degenerative disc bulging with facet hypertrophy at L2-3 with resultant mild right lateral recess stenosis, with suspected moderate bilateral L2 foraminal narrowing. 5. Mild to moderate right L3 foraminal stenosis related to disc bulge and facet hypertrophy. Electronically Signed    By: Rise Mu M.D.   On: 10/16/2023 23:26   Microbiology: Results for orders placed or performed during the hospital encounter of 10/28/23  Blood culture (routine x 2)     Status: None (Preliminary result)   Collection Time: 10/28/23  8:12 PM   Specimen: BLOOD  Result Value Ref Range Status   Specimen Description BLOOD LEFT ANTECUBITAL  Final   Special Requests   Final    BOTTLES DRAWN AEROBIC ONLY Blood Culture adequate volume   Culture   Final    NO GROWTH 4 DAYS Performed at [facility], [name] [place], [name] [address]    Report Status PENDING  Incomplete  Blood culture (routine x 2)     Status: None (Preliminary result)   Collection Time: 10/28/23  8:24 PM   Specimen: BLOOD  Result Value Ref Range Status  Specimen Description BLOOD RIGHT ANTECUBITAL  Final   Special Requests   Final    BOTTLES DRAWN AEROBIC AND ANAEROBIC Blood Culture adequate volume   Culture   Final    NO GROWTH 4 DAYS Performed at [facility], [address] [place], [name] [address]    Report Status PENDING  Incomplete  Aerobic/Anaerobic Culture w Gram Stain (surgical/deep wound)     Status: None (Preliminary result)   Collection Time: 10/29/23  3:45 PM   Specimen: Fine Needle Aspirate  Result Value Ref Range Status   Specimen Description NEEDLE ASPIRATE  Final   Special Requests INTERVERTEBRAL DISC  Final   Gram Stain NO WBC SEEN NO ORGANISMS SEEN   Final   Culture   Final    NO GROWTH 3 DAYS NO ANAEROBES ISOLATED; CULTURE IN PROGRESS FOR 5 DAYS Performed at [facility], [address], [place], [name] [address]    Report Status PENDING  Incomplete  Aerobic/Anaerobic Culture w Gram Stain (surgical/deep wound)     Status: None (Preliminary result)   Collection Time: 10/29/23  3:45 PM   Specimen: NEEDLE ASPIRATE; Abscess  Result Value Ref Range Status   Specimen Description NEEDLE ASPIRATE  Final   Special Requests INTERVERTEBRAL DISC  Final    Gram Stain NO WBC SEEN NO ORGANISMS SEEN   Final   Culture   Final    NO GROWTH 3 DAYS NO ANAEROBES ISOLATED; CULTURE IN PROGRESS FOR 5 DAYS Performed at [facility], [address] [place], [name] [address]    Report Status PENDING  Incomplete   Labs: CBC: Recent Labs  Lab 10/28/23 1240 10/29/23 0518 10/30/23 0508 10/31/23 0538 11/01/23 0420  WBC 8.0 8.1 6.0 6.4 6.7  NEUTROABS 4.8  --  3.2 4.1 3.8  HGB 13.8 12.9 12.4 11.9* 11.6*  HCT 46.3* 42.6 40.2 38.0 37.4  MCV 88.4 86.1 85.2 85.2 85.2  PLT 289 334 293 277 248   Basic Metabolic Panel: Recent Labs  Lab 10/28/23 1240 10/29/23 0518 10/30/23 0508 10/31/23 0538 11/01/23 0420  NA 140 139 133* 139 138  K 3.9 3.6 3.6 3.5 3.9  CL 104 101 97* 100 104  CO2 28 25 27 25 26   GLUCOSE 104* 107* 91 107* 95  BUN 14 15 18 22 23   CREATININE 0.64 0.58 0.65 0.96 0.79  CALCIUM 9.7 9.6 8.6* 9.0 8.8*  MG  --   --  2.0 2.0 2.0  PHOS  --   --  4.7* 4.9* 3.7   Liver Function Tests: Recent Labs  Lab 10/28/23 1240 10/30/23 0508 10/31/23 0538 11/01/23 0420  AST 25 19 16 17   ALT 24 21 18 17   ALKPHOS 124 103 97 91  BILITOT 0.3 0.4 0.3 0.2  PROT 6.3* 5.6* 5.6* 5.3*  ALBUMIN 3.7 3.2* 3.0* 2.9*   CBG: No results for input(s): "GLUCAP" in the last 168 hours.  Discharge time spent: greater than 30 minutes.  Signed: Marguerita Merles, DO [facility] 11/01/2023

## 2023-11-01 NOTE — Progress Notes (Signed)
  NEUROSURGERY PROGRESS NOTE   No issues overnight. Unchanged back pain  EXAM:  BP 134/70 (BP Location: Right Arm)   Pulse 67   Temp (!) 97.5 F (36.4 C) (Oral)   Resp 18   Wt 101.6 kg   LMP 11/20/2012   SpO2 96%   BMI 40.97 kg/m   Awake, alert, oriented  Speech fluent, appropriate  CN grossly intact  Generalized BLE weakness  IMPRESSION:  63 y.o. female with T9-10 osteo/discitis without radiographic stenosis or cord compression. No acute need for operative management.  PLAN: - Cont abx per ID - Cont PT/OT   Lisbeth Renshaw, MD [facility]

## 2023-11-01 NOTE — Progress Notes (Signed)
Inpatient Rehab Admissions Coordinator:    I spoke with Pt. Regarding potential CIR admit. She states that with her fibromyalgia, she feels like the pace of CIR is too much and prefers HH or SNF. I will let TOC know and CIR will sign off.   Megan Salon, MS, CCC-SLP Rehab Admissions Coordinator  [phone] (celll) [phone] (office)

## 2023-11-01 NOTE — Progress Notes (Signed)
   11/01/23 1622  AVS Discharge Documentation  AVS Discharge Instructions Including Medications Provided to patient/caregiver  Name of Person Receiving AVS Discharge Instructions Including Medications Jolaine Brand [facility]  Name of Clinician That Reviewed AVS Discharge Instructions Including Medications Rubin Payor RN     AVS instructions were reviewed with patient. All questions answered all personal belongings were returned.   Taxi voucher was provided.

## 2023-11-01 NOTE — Progress Notes (Signed)
Peripherally Inserted Central Catheter Placement  The IV Nurse, Chari Manning, RN has discussed with the patient and/or persons authorized to consent for the patient, the purpose of this procedure and the potential benefits and risks involved with this procedure.  The benefits include less needle sticks, lab draws from the catheter, and the patient may be discharged home with the catheter. Risks include, but not limited to, infection, bleeding, blood clot (thrombus formation), and puncture of an artery; nerve damage and irregular heartbeat and possibility to perform a PICC exchange if needed/ordered by physician.  Alternatives to this procedure were also discussed.  Bard Power PICC patient education guide, fact sheet on infection prevention and patient information card has been provided to patient /or left at bedside.    PICC Placement Documentation  PICC Single Lumen 11/01/23 Right Brachial 35 cm 0 cm (Active)  Indication for Insertion or Continuance of Line Home intravenous therapies (PICC only) 11/01/23 1200  Exposed Catheter (cm) 0 cm 11/01/23 1200  Site Assessment Clean, Dry, Intact 11/01/23 1200  Line Status Saline locked;Blood return noted 11/01/23 1200  Dressing Type Transparent;Securing device 11/01/23 1200  Dressing Status Antimicrobial disc/dressing in place;Clean, Dry, Intact 11/01/23 1200  Line Care Connections checked and tightened 11/01/23 1200  Line Adjustment (NICU/IV Team Only) No 11/01/23 1200  Dressing Intervention New dressing;Adhesive placed at insertion site (IV team only) 11/01/23 1200  Dressing Change Due 11/08/23 11/01/23 1200       Burnard Bunting Chenice 11/01/2023, 12:51 PM

## 2023-11-01 NOTE — Plan of Care (Signed)

## 2023-11-01 NOTE — TOC Transition Note (Addendum)
Transition of Care [facility]) - Discharge Note   Patient Details  Name: LESLYN MONDA MRN: [id number] Date of Birth: May 13, 1961  [facility] of [facility] CM/SW Contact:  Lawerance Sabal, RN Phone Number: 11/01/2023, 1:13 PM   Clinical Narrative:     Dory Peru, Patient will need 2pm dose of medication prior to DC, nurse aware via secure chat.  Patient to DC to home w [facility], established prior to admission   17:05 met with patient at bedside, per nursing patient is refusing to leave. Reviewed PT notes w patient, and her choice to decline CIR and have HH.  Reviewed with patient IM and right to appeal. Provided her number to call. Assisted with call at bedside.   Patient was receiving visitor when I left the room     Barriers to Discharge: Continued Medical Work up   Patient Goals and CMS Choice Patient states their goals for this hospitalization and ongoing recovery are:: return home   Choice offered to / list presented to : Patient      Discharge Placement                       Discharge Plan and Services Additional resources added to the After Visit Summary for       Post Acute Care Choice: Home Health                    HH Arranged: PT, OT [facility] Agency: [facility] Date [facility] Agency Contacted: 10/30/23 Time HH Agency Contacted: 1604 Representative spoke with at [facility] Agency: Jerilynn Som  Social Drivers of Health (SDOH) Interventions SDOH Screenings   Food Insecurity: No Food Insecurity (10/29/2023)  Housing: Low Risk  (10/29/2023)  Transportation Needs: No Transportation Needs (10/29/2023)  Utilities: Not At Risk (10/29/2023)  Depression (PHQ2-9): Low Risk  (01/16/2023)  Tobacco Use: Medium Risk (10/29/2023)     Readmission Risk Interventions    05/17/2022    3:02 PM  Readmission Risk Prevention Plan  Transportation Screening Complete  PCP or Specialist Appt within 5-7 Days Complete  Home Care Screening Complete  Medication Review (RN CM)  Complete

## 2023-11-01 NOTE — Progress Notes (Signed)
Disposable scrubs provided, taxi voucher provided.   Patient is refusing to leave. Primary team informed.

## 2023-11-01 NOTE — Care Management Important Message (Signed)
Important Message  Patient Details  Name: [name] MRN: [id number] Date of Birth: 06-19-1961   Important Message Given:  Yes - Medicare IM     Lawerance Sabal, RN 11/01/2023, 5:07 PM

## 2023-11-02 LAB — CULTURE, BLOOD (ROUTINE X 2)
Culture: NO GROWTH
Culture: NO GROWTH
Special Requests: ADEQUATE
Special Requests: ADEQUATE

## 2023-11-03 LAB — AEROBIC/ANAEROBIC CULTURE W GRAM STAIN (SURGICAL/DEEP WOUND)
Culture: NO GROWTH
Culture: NO GROWTH
Gram Stain: NONE SEEN
Gram Stain: NONE SEEN

## 2023-11-13 ENCOUNTER — Telehealth: Payer: Self-pay

## 2023-11-13 ENCOUNTER — Ambulatory Visit: Payer: Self-pay | Admitting: Internal Medicine

## 2023-11-13 NOTE — Telephone Encounter (Signed)
 Patient returned call - patient can come in on 2/13 at 1 PM. Patient will call back if unable to make it to appointment.   [name], CMA

## 2023-11-13 NOTE — Telephone Encounter (Signed)
 Called patient to see if she was going to make it to today's appointment, no answer. Left HIPAA compliant voicemail requesting callback.   Patient will need appointment before EOT 3/6.  Eulice Rutledge D Samiya Mervin, RN

## 2023-11-19 ENCOUNTER — Emergency Department (HOSPITAL_COMMUNITY)
Admission: EM | Admit: 2023-11-19 | Discharge: 2023-11-19 | Disposition: A | Discharge status: Home / Self Care | Payer: 59 | Attending: Emergency Medicine | Admitting: Emergency Medicine

## 2023-11-19 ENCOUNTER — Other Ambulatory Visit: Payer: Self-pay

## 2023-11-19 ENCOUNTER — Emergency Department (HOSPITAL_BASED_OUTPATIENT_CLINIC_OR_DEPARTMENT_OTHER): Payer: 59

## 2023-11-19 ENCOUNTER — Emergency Department (HOSPITAL_COMMUNITY): Payer: 59

## 2023-11-19 DIAGNOSIS — M7989 Other specified soft tissue disorders: Secondary | ICD-10-CM | POA: Diagnosis not present

## 2023-11-19 DIAGNOSIS — R2241 Localized swelling, mass and lump, right lower limb: Secondary | ICD-10-CM | POA: Insufficient documentation

## 2023-11-19 NOTE — Progress Notes (Signed)
CSW received consult for patient for placement at CIR.   Per chart review, patient was being considered for CIR during recent admission on 11/01/23 but ultimately chose to go home with home health services from [facility]. RN CM to speak with [facility] staff to determine if agency staff can come see patient today after discharge.  CSW spoke with RN who states per patient she is unable to walk and that bedside RN will walk with patient to see if she is able.  Edwin Dada, MSW, LCSW Transitions of Care  Clinical Social Worker II [phone]

## 2023-11-19 NOTE — ED Provider Notes (Signed)
[name] EMERGENCY DEPARTMENT AT [facility] Provider Note   CSN: [id number] Arrival date & time: 11/19/23  1000     History  Chief Complaint  Patient presents with   [name] is a 63 y.o. female.  63 yo F with a chief complaint of right leg swelling.  She noticed this this morning.  She has been having difficulty moving her legs due to a spinal infection.  She is getting antibiotics through a PICC line.  She does not feel like her leg weakness is changed.  She think she can do what she normally does.  Report from EMS was that she was having worsening leg weakness though the patient denies this.  She said she noticed that her ankle had turned in about a week ago but did not think much of it but now is having some swelling and so is here to be evaluated.  She does have a history of infected hardware to that ankle.  She places an ASO.  Denies any pain.  Denies fevers.  Denies difficulty breathing.   Fall       Home Medications Prior to Admission medications   Medication Sig Start Date End Date Taking? Authorizing Provider  acetaminophen (TYLENOL) 325 MG tablet Take 2 tablets (650 mg total) by mouth every 6 (six) hours as needed for mild pain (pain score 1-3) (or Fever >/= 101). 11/01/23   Marguerita Merles Latif, DO  albuterol (VENTOLIN HFA) 108 (90 Base) MCG/ACT inhaler Inhale 2 puffs into the lungs every 6 (six) hours as needed for wheezing or shortness of breath. 03/24/20   Medina-Vargas, Monina C, NP  ascorbic acid (VITAMIN C) 500 MG tablet Take 500 mg by mouth daily.    [provider]  aspirin EC 81 MG tablet Take 81 mg by mouth in the morning.    [provider]  atenolol (TENORMIN) 50 MG tablet Take 1 tablet (50 mg total) by mouth daily. Patient taking differently: Take 50 mg by mouth in the morning. 03/24/20   Medina-Vargas, Monina C, NP  ceFEPime (MAXIPIME) IVPB Inject 2 g into the vein every 8 (eight) hours. Indication:   Discitis/osteomyelitis  First Dose: Yes Last Day of Therapy:  12/11/23 Labs - Once weekly:  CBC/D and BMP, Labs - Once weekly: ESR and CRP Method of administration: IV Push Method of administration may be changed at the discretion of home infusion pharmacist based upon assessment of the patient and/or caregiver's ability to self-administer the medication ordered. 11/01/23 12/12/23  Marguerita Merles Latif, DO  chlorzoxazone (PARAFON) 500 MG tablet Take 1 tablet (500 mg total) by mouth 3 (three) times daily. Patient taking differently: Take 500 mg by mouth 4 (four) times daily. 03/24/20   Medina-Vargas, Monina C, NP  Cholecalciferol (VITAMIN D-3 PO) Take 1 tablet by mouth in the morning.    [provider]  cyanocobalamin (VITAMIN B12) 1000 MCG tablet Take 1,000 mcg by mouth once a week. Patient not taking: Reported on 10/28/2023    [provider]  cyclobenzaprine (FLEXERIL) 10 MG tablet Take 10 mg by mouth 3 (three) times daily.    [provider]  diazepam (VALIUM) 5 MG tablet Take 1 tablet (5 mg total) by mouth 2 (two) times daily. Patient taking differently: Take 5 mg by mouth See admin instructions. Take 5 mg at bedtime and additional 5 mg if needed for anxiety 03/22/20   Medina-Vargas, Monina C, NP  diclofenac Sodium (VOLTAREN) 1 %  GEL Apply 2 g topically 4 (four) times daily as needed (pain).    [provider]  diphenhydrAMINE (BENADRYL) 25 MG tablet Take 50 mg by mouth in the morning, at noon, in the evening, and at bedtime.    [provider]  ferrous sulfate 325 (65 FE) MG EC tablet Take 325 mg by mouth in the morning.    [provider]  gabapentin (NEURONTIN) 600 MG tablet Take 2 tablets (1,200 mg total) by mouth 3 (three) times daily. 03/24/20   Medina-Vargas, Monina C, NP  hydrOXYzine (ATARAX/VISTARIL) 50 MG tablet Take 50 mg by mouth 2 (two) times daily as needed for anxiety (sleep). 06/23/20   [provider]  lubiprostone  (AMITIZA) 24 MCG capsule Take 1 capsule (24 mcg total) by mouth 2 (two) times daily with a meal. Patient taking differently: Take 48 mcg by mouth daily before lunch. 03/24/20   Medina-Vargas, Monina C, NP  methocarbamol (ROBAXIN) 500 MG tablet Take 500 mg by mouth 4 (four) times daily.    [provider]  Multiple Minerals-Vitamins (CALCIUM-MAGNESIUM-ZINC-D3 PO) Take 1 tablet by mouth in the morning, at noon, and at bedtime.    [provider]  Multiple Vitamin (MULTIVITAMIN WITH MINERALS) TABS tablet Take 1 tablet by mouth in the morning.    [provider]  NARCAN 4 MG/0.1ML LIQD nasal spray kit Place 1 spray into the nose once. Patient not taking: Reported on 10/28/2023 05/26/20   [provider]  omeprazole (PRILOSEC) 20 MG capsule Take 20 mg by mouth in the morning and at bedtime.     [provider]  ondansetron (ZOFRAN) 4 MG tablet Take 1 tablet (4 mg total) by mouth every 6 (six) hours as needed for nausea. 11/01/23   Marguerita Merles Latif, DO  oxyCODONE-acetaminophen (PERCOCET) 10-325 MG tablet Take 1 tablet by mouth every 6 (six) hours as needed for pain. Patient taking differently: Take 1 tablet by mouth 5 (five) times daily as needed for pain. 03/22/20   Medina-Vargas, Monina C, NP  senna (SENOKOT) 8.6 MG TABS tablet Take 2 tablets by mouth daily before lunch. Take with Amitiza Patient not taking: Reported on 10/28/2023    [provider]      Allergies    Monosodium glutamate, Shellfish allergy, Baclofen, Celebrex [celecoxib], Contrast media [iodinated contrast media], Diclofenac, Gadolinium, Other, and Latex    Review of Systems   Review of Systems  Physical Exam Updated Vital Signs BP (!) 161/68 (BP Location: Left Arm)   Pulse 81   Temp (!) 97.5 F (36.4 C) (Oral)   Resp 18   Ht 5\' 2"  (1.575 m)   Wt 101 kg   LMP 11/20/2012   SpO2 95%   BMI 40.73 kg/m  Physical Exam Vitals and nursing note reviewed.  Constitutional:       General: She is not in acute distress.    Appearance: She is well-developed. She is not diaphoretic.  HENT:     Head: Normocephalic and atraumatic.  Eyes:     Pupils: Pupils are equal, round, and reactive to light.  Cardiovascular:     Rate and Rhythm: Normal rate and regular rhythm.     Heart sounds: No murmur heard.    No friction rub. No gallop.  Pulmonary:     Effort: Pulmonary effort is normal.     Breath sounds: No wheezing or rales.  Abdominal:     General: There is no distension.     Palpations: Abdomen is  soft.     Tenderness: There is no abdominal tenderness.  Musculoskeletal:        General: No tenderness.     Cervical back: Normal range of motion and neck supple.     Right lower leg: Edema present.     Comments: Edema to bilateral lower extremities right greater than left.  Pulse motor and sensation intact to the foot on the right.  No obvious break in skin.  No erythema or warmth.  Skin:    General: Skin is warm and dry.  Neurological:     Mental Status: She is alert and oriented to person, place, and time.  Psychiatric:        Behavior: Behavior normal.     ED Results / Procedures / Treatments   Labs (all labs ordered are listed, but only abnormal results are displayed) Labs Reviewed - No data to display  EKG None  Radiology DG Ankle Complete Right Result Date: 11/19/2023 CLINICAL DATA:  Right ankle swelling. History of prior surgery approximately 5 years ago. EXAM: RIGHT ANKLE - COMPLETE 3+ VIEW COMPARISON:  None available this time. FINDINGS: Status post ORIF of the distal fibula with lateral plate and screw fixation construct and syndesmotic stabilization. Hardware appears intact with appropriate alignment. No evidence of significant periprosthetic lucency. No acute fracture or dislocation. Ankle mortise appears congruent. Soft tissue swelling of the ankle, most pronounced anterolaterally. IMPRESSION: Intact ORIF of the distal fibula and syndesmotic  stabilization with appropriate alignment. No acute osseous abnormality. Electronically Signed   By: Hart Robinsons M.D.   On: 11/19/2023 12:21   VAS [place] LOWER EXTREMITY VENOUS (DVT) Result Date: 11/19/2023  Lower Venous DVT Study Patient Name:  [name]  Date of Exam:   11/19/2023 Medical Rec #: [id number]        Accession #:    [id number] Date of Birth: 05/06/1961       Patient Gender: F Patient Age:   58 years Exam Location:  [facility] Procedure:      VAS [place] LOWER EXTREMITY VENOUS (DVT) Referring Phys: Shaconda Hajduk --------------------------------------------------------------------------------  Indications: Swelling, and Edema.  Risk Factors: Surgery Surgery on right foot Trauma Recent fall past pregnancy and obesity. Comparison Study: No significant changes seen since the previous exam 03/26/19 Performing Technologist: Shona Simpson  Examination Guidelines: A complete evaluation includes B-mode imaging, spectral Doppler, color Doppler, and power Doppler as needed of all accessible portions of each vessel. Bilateral testing is considered an integral part of a complete examination. Limited examinations for reoccurring indications may be performed as noted. The reflux portion of the exam is performed with the patient in reverse Trendelenburg.  +---------+---------------+---------+-----------+----------+--------------+ RIGHT    CompressibilityPhasicitySpontaneityPropertiesThrombus Aging +---------+---------------+---------+-----------+----------+--------------+ CFV      Full           Yes      Yes                                 +---------+---------------+---------+-----------+----------+--------------+ SFJ      Full                                                        +---------+---------------+---------+-----------+----------+--------------+ FV Prox  Full                                                         +---------+---------------+---------+-----------+----------+--------------+  FV Mid   Full                                                        +---------+---------------+---------+-----------+----------+--------------+ FV DistalFull                                                        +---------+---------------+---------+-----------+----------+--------------+ PFV      Full                                                        +---------+---------------+---------+-----------+----------+--------------+ POP      Full           Yes      Yes                                 +---------+---------------+---------+-----------+----------+--------------+ PTV      Full                                                        +---------+---------------+---------+-----------+----------+--------------+ PERO     Full                                                        +---------+---------------+---------+-----------+----------+--------------+   +----+---------------+---------+-----------+----------+--------------+ LEFTCompressibilityPhasicitySpontaneityPropertiesThrombus Aging +----+---------------+---------+-----------+----------+--------------+ CFV Full           Yes      Yes                                 +----+---------------+---------+-----------+----------+--------------+     Summary: RIGHT: - There is no evidence of deep vein thrombosis in the lower extremity.  - No cystic structure found in the popliteal fossa.  LEFT: - No evidence of common femoral vein obstruction.   *See table(s) above for measurements and observations.    Preliminary     Procedures Procedures    Medications Ordered in ED Medications - No data to display  ED Course/ Medical Decision Making/ A&P                                 Medical Decision Making Amount and/or Complexity of Data Reviewed Radiology: ordered.   63 yo F with a chief complaints of leg swelling.  Noticed this  morning.  Had a reported ankle injury maybe about 2 weeks ago.  She is currently getting antibiotics through a PICC line for T9 epidural abscess and phlegmon that was drained by IR.  She does not endorse any worsening weakness.  Feels like she is able to complete the ADLs she was previously able to when she was discharged from the hospital.  Mostly here for the leg swelling.  Will obtain a plain film.  DVT study.  Plain film independently interpreted by me without fracture or misalignment of hardware.  DVT study was negative for acute DVT.  Will discharge home.  12:34 PM:  I have discussed the diagnosis/risks/treatment options with the patient.  Evaluation and diagnostic testing in the emergency department does not suggest an emergent condition requiring admission or immediate intervention beyond what has been performed at this time.  They will follow up with PCP. We also discussed returning to the ED immediately if new or worsening sx occur. We discussed the sx which are most concerning (e.g., sudden worsening pain, fever, inability to tolerate by mouth) that necessitate immediate return. Medications administered to the patient during their visit and any new prescriptions provided to the patient are listed below.  Medications given during this visit Medications - No data to display   The patient appears reasonably screen and/or stabilized for discharge and I doubt any other medical condition or other [facility] requiring further screening, evaluation, or treatment in the ED at this time prior to discharge.          Final Clinical Impression(s) / ED Diagnoses Final diagnoses:  Right leg swelling    Rx / DC Orders ED Discharge Orders     None         Melene Plan, DO 11/19/23 1234

## 2023-11-19 NOTE — Progress Notes (Signed)
Lower extremity venous duplex completed. Please see CV Procedures for preliminary results.  Shona Simpson, RVT 11/19/23 11:13 AM

## 2023-11-19 NOTE — Discharge Instructions (Signed)
Your study for a blood clot was negative.  The x-ray did not show any obvious broken bones or problems with your hardware.  Based on your story maybe you sprained your ankle.  Continue to wear your brace.  Follow-up with your orthopedic doctor in the office.

## 2023-11-19 NOTE — ED Notes (Signed)
Got patient on the monitor and got Vitals

## 2023-11-19 NOTE — ED Triage Notes (Signed)
Pt to the ed from home via ems with a Cc of increased  weakness witth frequent falls. Pt relays she fell this morning and was on the ground for about an hour. Pt denies, cp, sob, loc at this time.

## 2023-11-20 ENCOUNTER — Ambulatory Visit: Payer: 59 | Admitting: Internal Medicine

## 2023-11-26 ENCOUNTER — Other Ambulatory Visit: Payer: Self-pay

## 2023-11-26 ENCOUNTER — Emergency Department (HOSPITAL_COMMUNITY): Payer: 59

## 2023-11-26 ENCOUNTER — Observation Stay (HOSPITAL_COMMUNITY)
Admission: EM | Admit: 2023-11-26 | Discharge: 2023-12-04 | Disposition: A | Discharge status: Skilled Nursing Facility | Payer: 59 | Attending: Internal Medicine | Admitting: Internal Medicine

## 2023-11-26 DIAGNOSIS — Z79899 Other long term (current) drug therapy: Secondary | ICD-10-CM | POA: Diagnosis not present

## 2023-11-26 DIAGNOSIS — M6281 Muscle weakness (generalized): Secondary | ICD-10-CM | POA: Diagnosis not present

## 2023-11-26 DIAGNOSIS — Z87891 Personal history of nicotine dependence: Secondary | ICD-10-CM | POA: Diagnosis not present

## 2023-11-26 DIAGNOSIS — I10 Essential (primary) hypertension: Secondary | ICD-10-CM | POA: Insufficient documentation

## 2023-11-26 DIAGNOSIS — M4644 Discitis, unspecified, thoracic region: Secondary | ICD-10-CM | POA: Diagnosis not present

## 2023-11-26 DIAGNOSIS — E669 Obesity, unspecified: Secondary | ICD-10-CM | POA: Diagnosis not present

## 2023-11-26 DIAGNOSIS — E0590 Thyrotoxicosis, unspecified without thyrotoxic crisis or storm: Secondary | ICD-10-CM | POA: Diagnosis present

## 2023-11-26 DIAGNOSIS — Z9889 Other specified postprocedural states: Secondary | ICD-10-CM | POA: Diagnosis not present

## 2023-11-26 DIAGNOSIS — T82898A Other specified complication of vascular prosthetic devices, implants and grafts, initial encounter: Principal | ICD-10-CM

## 2023-11-26 DIAGNOSIS — T3695XA Adverse effect of unspecified systemic antibiotic, initial encounter: Secondary | ICD-10-CM | POA: Diagnosis not present

## 2023-11-26 DIAGNOSIS — R296 Repeated falls: Secondary | ICD-10-CM

## 2023-11-26 DIAGNOSIS — R29898 Other symptoms and signs involving the musculoskeletal system: Secondary | ICD-10-CM

## 2023-11-26 DIAGNOSIS — T82594A Other mechanical complication of infusion catheter, initial encounter: Principal | ICD-10-CM | POA: Insufficient documentation

## 2023-11-26 DIAGNOSIS — Z8739 Personal history of other diseases of the musculoskeletal system and connective tissue: Secondary | ICD-10-CM

## 2023-11-26 DIAGNOSIS — Y711 Therapeutic (nonsurgical) and rehabilitative cardiovascular devices associated with adverse incidents: Secondary | ICD-10-CM | POA: Insufficient documentation

## 2023-11-26 DIAGNOSIS — M797 Fibromyalgia: Secondary | ICD-10-CM | POA: Diagnosis not present

## 2023-11-26 DIAGNOSIS — Z789 Other specified health status: Secondary | ICD-10-CM | POA: Diagnosis present

## 2023-11-26 DIAGNOSIS — K521 Toxic gastroenteritis and colitis: Secondary | ICD-10-CM | POA: Insufficient documentation

## 2023-11-26 DIAGNOSIS — Z96653 Presence of artificial knee joint, bilateral: Secondary | ICD-10-CM | POA: Insufficient documentation

## 2023-11-26 DIAGNOSIS — M51369 Other intervertebral disc degeneration, lumbar region without mention of lumbar back pain or lower extremity pain: Secondary | ICD-10-CM | POA: Diagnosis present

## 2023-11-26 LAB — COMPREHENSIVE METABOLIC PANEL
ALT: 24 U/L (ref 0–44)
AST: 28 U/L (ref 15–41)
Albumin: 3.5 g/dL (ref 3.5–5.0)
Alkaline Phosphatase: 112 U/L (ref 38–126)
Anion gap: 15 (ref 5–15)
BUN: 12 mg/dL (ref 8–23)
CO2: 30 mmol/L (ref 22–32)
Calcium: 9.5 mg/dL (ref 8.9–10.3)
Chloride: 97 mmol/L — ABNORMAL LOW (ref 98–111)
Creatinine, Ser: 0.71 mg/dL (ref 0.44–1.00)
GFR, Estimated: >60 mL/min (ref >60)
Glucose, Bld: 80 mg/dL (ref 70–99)
Potassium: 3 mmol/L — ABNORMAL LOW (ref 3.5–5.1)
Sodium: 142 mmol/L (ref 135–145)
Total Bilirubin: 0.3 mg/dL (ref 0.0–1.2)
Total Protein: 6.3 g/dL — ABNORMAL LOW (ref 6.5–8.1)

## 2023-11-26 LAB — CBC WITH DIFFERENTIAL/PLATELET
Abs Immature Granulocytes: 0.01 10*3/uL (ref 0.00–0.07)
Basophils Absolute: 0 10*3/uL (ref 0.0–0.1)
Basophils Relative: 1 %
Eosinophils Absolute: 0.4 10*3/uL (ref 0.0–0.5)
Eosinophils Relative: 5 %
HCT: 42.8 % (ref 36.0–46.0)
Hemoglobin: 13 g/dL (ref 12.0–15.0)
Immature Granulocytes: 0 %
Lymphocytes Relative: 26 %
Lymphs Abs: 2.1 10*3/uL (ref 0.7–4.0)
MCH: 25.7 pg — ABNORMAL LOW (ref 26.0–34.0)
MCHC: 30.4 g/dL (ref 30.0–36.0)
MCV: 84.8 fL (ref 80.0–100.0)
Monocytes Absolute: 0.9 10*3/uL (ref 0.1–1.0)
Monocytes Relative: 11 %
Neutro Abs: 4.8 10*3/uL (ref 1.7–7.7)
Neutrophils Relative %: 57 %
Platelets: 290 10*3/uL (ref 150–400)
RBC: 5.05 MIL/uL (ref 3.87–5.11)
RDW: 13.2 % (ref 11.5–15.5)
WBC: 8.2 10*3/uL (ref 4.0–10.5)
nRBC: 0 % (ref 0.0–0.2)

## 2023-11-26 LAB — SEDIMENTATION RATE: Sed Rate: 25 mm/h — ABNORMAL HIGH (ref 0–22)

## 2023-11-26 LAB — C-REACTIVE PROTEIN: CRP: 2.4 mg/dL — ABNORMAL HIGH (ref <1.0)

## 2023-11-26 MED ORDER — BISACODYL 5 MG PO TBEC: 5.0000 mg | DELAYED_RELEASE_TABLET | Freq: Every day | ORAL | Status: DC | PRN

## 2023-11-26 MED ORDER — ONDANSETRON HCL 4 MG/2ML IJ SOLN: 4.0000 mg | Freq: Four times a day (QID) | INTRAMUSCULAR | Status: DC | PRN

## 2023-11-26 MED ORDER — POTASSIUM CHLORIDE CRYS ER 20 MEQ PO TBCR
40.0000 meq | EXTENDED_RELEASE_TABLET | Freq: Once | ORAL | Status: AC
  Administered 2023-11-26: 40 meq via ORAL
  Filled 2023-11-26: qty 2

## 2023-11-26 MED ORDER — ALTEPLASE 2 MG IJ SOLR
2.0000 mg | Freq: Once | INTRAMUSCULAR | Status: AC
  Administered 2023-11-26: 2 mg
  Filled 2023-11-26 (×2): qty 2

## 2023-11-26 MED ORDER — CEFEPIME IV (FOR PTA / DISCHARGE USE ONLY): 2.0000 g | Freq: Three times a day (TID) | INTRAVENOUS | Status: DC

## 2023-11-26 MED ORDER — ONDANSETRON HCL 4 MG PO TABS
4.0000 mg | ORAL_TABLET | Freq: Four times a day (QID) | ORAL | Status: DC | PRN
  Filled 2023-11-26: qty 1

## 2023-11-26 MED ORDER — SODIUM CHLORIDE 0.9 % IV SOLN
2.0000 g | Freq: Three times a day (TID) | INTRAVENOUS | Status: DC
  Administered 2023-11-26 – 2023-12-04 (×24): 2 g via INTRAVENOUS
  Filled 2023-11-26 (×24): qty 12.5

## 2023-11-26 MED ORDER — ENOXAPARIN SODIUM 40 MG/0.4ML IJ SOSY
40.0000 mg | PREFILLED_SYRINGE | INTRAMUSCULAR | Status: DC
  Administered 2023-11-27 – 2023-12-03 (×7): 40 mg via SUBCUTANEOUS
  Filled 2023-11-26 (×7): qty 0.4

## 2023-11-26 NOTE — Progress Notes (Signed)
Secure chat sent to nurse regarding consult. Tomasita Morrow, RN VAST

## 2023-11-26 NOTE — ED Notes (Signed)
 Patient transported to MRI

## 2023-11-26 NOTE — ED Notes (Signed)
 IV team at bedside

## 2023-11-26 NOTE — ED Notes (Signed)
Pt unable to ambulate, requires maximum assistance to stand and pivot into wheelchair.

## 2023-11-26 NOTE — ED Notes (Signed)
Pt request to stay in recliner which was comfortable for her. Pt changed into gown

## 2023-11-26 NOTE — Progress Notes (Signed)
Still no blood return from PICC.  Sluggish to flush.  Secure chat to Dr. Rhae Hammock after speaking with Morrie Sheldon RN at bedside.  Awaiting plan.

## 2023-11-26 NOTE — Progress Notes (Signed)
Arrived to remove alteplase out of PICC, patient is currently in the bathroom.

## 2023-11-26 NOTE — ED Provider Notes (Signed)
[place] EMERGENCY DEPARTMENT AT [facility] Provider Note   CSN: [id number] Arrival date & time: 11/26/23  1016     History  Chief Complaint  Patient presents with   IV access unable to flush    [name] is a 63 y.o. female.  With a history of vertebral osteomyelitis and chronic back pain who presents to the ED for issues with her PICC line.  Recent hospital admission last month for vertebral osteomyelitis.  She was discharged home with a right upper extremity PICC line in place for IV antibiotics.  Has had issues flushing the PICC line for the last 2 days.  Was seen earlier today by an at-home nurse and instructed to come here for further evaluation of PICC line obstruction.  Patient also notes she has had increased difficulty walking because of weakness in bilateral lower extremities since the time of her discharge home.  She has fallen multiple times.  She has not had any fevers or chills or focal neurologic deficits.  HPI     Home Medications Prior to Admission medications   Medication Sig Start Date End Date Taking? Authorizing Provider  acetaminophen (TYLENOL) 325 MG tablet Take 2 tablets (650 mg total) by mouth every 6 (six) hours as needed for mild pain (pain score 1-3) (or Fever >/= 101). 11/01/23   Marguerita Merles Latif, DO  albuterol (VENTOLIN HFA) 108 (90 Base) MCG/ACT inhaler Inhale 2 puffs into the lungs every 6 (six) hours as needed for wheezing or shortness of breath. 03/24/20   Medina-Vargas, Monina C, NP  ascorbic acid (VITAMIN C) 500 MG tablet Take 500 mg by mouth daily.    [provider]  aspirin EC 81 MG tablet Take 81 mg by mouth in the morning.    [provider]  atenolol (TENORMIN) 50 MG tablet Take 1 tablet (50 mg total) by mouth daily. Patient taking differently: Take 50 mg by mouth in the morning. 03/24/20   Medina-Vargas, Monina C, NP  ceFEPime (MAXIPIME) IVPB Inject 2 g into the vein every 8 (eight) hours. Indication:   Discitis/osteomyelitis  First Dose: Yes Last Day of Therapy:  12/11/23 Labs - Once weekly:  CBC/D and BMP, Labs - Once weekly: ESR and CRP Method of administration: IV Push Method of administration may be changed at the discretion of home infusion pharmacist based upon assessment of the patient and/or caregiver's ability to self-administer the medication ordered. 11/01/23 12/12/23  Marguerita Merles Latif, DO  chlorzoxazone (PARAFON) 500 MG tablet Take 1 tablet (500 mg total) by mouth 3 (three) times daily. Patient taking differently: Take 500 mg by mouth 4 (four) times daily. 03/24/20   Medina-Vargas, Monina C, NP  Cholecalciferol (VITAMIN D-3 PO) Take 1 tablet by mouth in the morning.    [provider]  cyanocobalamin (VITAMIN B12) 1000 MCG tablet Take 1,000 mcg by mouth once a week. Patient not taking: Reported on 10/28/2023    [provider]  cyclobenzaprine (FLEXERIL) 10 MG tablet Take 10 mg by mouth 3 (three) times daily.    [provider]  diazepam (VALIUM) 5 MG tablet Take 1 tablet (5 mg total) by mouth 2 (two) times daily. Patient taking differently: Take 5 mg by mouth See admin instructions. Take 5 mg at bedtime and additional 5 mg if needed for anxiety 03/22/20   Medina-Vargas, Monina C, NP  diclofenac Sodium (VOLTAREN) 1 % GEL Apply 2 g topically 4 (four) times daily as needed (pain).    [provider]  diphenhydrAMINE (BENADRYL) 25 MG tablet Take 50 mg by mouth in the morning, at noon, in the evening, and at bedtime.    [provider]  ferrous sulfate 325 (65 FE) MG EC tablet Take 325 mg by mouth in the morning.    [provider]  gabapentin (NEURONTIN) 600 MG tablet Take 2 tablets (1,200 mg total) by mouth 3 (three) times daily. 03/24/20   Medina-Vargas, Monina C, NP  hydrOXYzine (ATARAX/VISTARIL) 50 MG tablet Take 50 mg by mouth 2 (two) times daily as needed for anxiety (sleep). 06/23/20   [provider]  lubiprostone  (AMITIZA) 24 MCG capsule Take 1 capsule (24 mcg total) by mouth 2 (two) times daily with a meal. Patient taking differently: Take 48 mcg by mouth daily before lunch. 03/24/20   Medina-Vargas, Monina C, NP  methocarbamol (ROBAXIN) 500 MG tablet Take 500 mg by mouth 4 (four) times daily.    [provider]  Multiple Minerals-Vitamins (CALCIUM-MAGNESIUM-ZINC-D3 PO) Take 1 tablet by mouth in the morning, at noon, and at bedtime.    [provider]  Multiple Vitamin (MULTIVITAMIN WITH MINERALS) TABS tablet Take 1 tablet by mouth in the morning.    [provider]  NARCAN 4 MG/0.1ML LIQD nasal spray kit Place 1 spray into the nose once. Patient not taking: Reported on 10/28/2023 05/26/20   [provider]  omeprazole (PRILOSEC) 20 MG capsule Take 20 mg by mouth in the morning and at bedtime.     [provider]  ondansetron (ZOFRAN) 4 MG tablet Take 1 tablet (4 mg total) by mouth every 6 (six) hours as needed for nausea. 11/01/23   Marguerita Merles Latif, DO  oxyCODONE-acetaminophen (PERCOCET) 10-325 MG tablet Take 1 tablet by mouth every 6 (six) hours as needed for pain. Patient taking differently: Take 1 tablet by mouth 5 (five) times daily as needed for pain. 03/22/20   Medina-Vargas, Monina C, NP  senna (SENOKOT) 8.6 MG TABS tablet Take 2 tablets by mouth daily before lunch. Take with Amitiza Patient not taking: Reported on 10/28/2023    [provider]      Allergies    Monosodium glutamate, Shellfish allergy, Baclofen, Celebrex [celecoxib], Contrast media [iodinated contrast media], Diclofenac, Gadolinium, Other, and Latex    Review of Systems   Review of Systems  Physical Exam Updated Vital Signs BP 117/86   Pulse 77   Temp 98.4 F (36.9 C) (Oral)   Resp 16   LMP 11/20/2012   SpO2 99%  Physical Exam Vitals and nursing note reviewed.  HENT:     Head: Normocephalic and atraumatic.  Eyes:     Pupils: Pupils are equal, round, and reactive  to light.  Cardiovascular:     Rate and Rhythm: Normal rate and regular rhythm.  Pulmonary:     Effort: Pulmonary effort is normal.     Breath sounds: Normal breath sounds.  Abdominal:     Palpations: Abdomen is soft.     Tenderness: There is no abdominal tenderness.  Musculoskeletal:     Comments: Right upper arm PICC line in place without surrounding erythema or induration  Skin:    General: Skin is warm and dry.  Neurological:     Mental Status: She is alert.     Comments: 4-5 strength bilateral lower extremities Sensation intact to light touch throughout 2+ DP pulses bilaterally  Psychiatric:        Mood and Affect: Mood normal.     ED  Results / Procedures / Treatments   Labs (all labs ordered are listed, but only abnormal results are displayed) Labs Reviewed  CBC WITH DIFFERENTIAL/PLATELET - Abnormal; Notable for the following components:      Result Value   MCH 25.7 (*)    All other components within normal limits  COMPREHENSIVE METABOLIC PANEL  SEDIMENTATION RATE  C-REACTIVE PROTEIN    EKG None  Radiology No results found.  Procedures Procedures    Medications Ordered in ED Medications  alteplase (CATHFLO ACTIVASE) injection 2 mg (2 mg Intracatheter Given 11/26/23 1302)    ED Course/ Medical Decision Making/ A&P Clinical Course as of 11/26/23 1616  Wed Nov 26, 2023  1530 I, Estelle June DO, am transitioning care of this patient to the oncoming provider pending MRI, IV therapy team evaluation, clinical reevaluation and disposition [MP]    Clinical Course User Index [MP] Royanne Foots, DO                                 Medical Decision Making 63 year old female with history as above presenting for potential PICC line obstruction.  Recent admission and treatment for vertebral osteomyelitis.  She also notes significant difficulty with ambulation at the time of her discharge with multiple falls.  We will get the IV therapy team to come and take a  look at a PICC line.  Regarding the weakness in her legs, this is concerning for interval worsening of vertebral osteomyelitis.  Will obtain laboratory workup and MRI here.  No focal deficit or significant sensory deficit on my exam  Amount and/or Complexity of Data Reviewed Labs: ordered. Radiology: ordered.           Final Clinical Impression(s) / ED Diagnoses Final diagnoses:  Occluded PICC line, initial encounter (HCC)  Weakness of both lower extremities  History of osteomyelitis    Rx / DC Orders ED Discharge Orders     None         Royanne Foots, DO 11/26/23 1616

## 2023-11-26 NOTE — Progress Notes (Signed)
Phone call made. Patient being moved to triage room. Tomasita Morrow, RN VAST

## 2023-11-26 NOTE — ED Provider Notes (Signed)
I was asked to follow-up on the patient's labs as well as repeat MRIs for some generalized weakness after recent surgery for epidural abscess/discitis.  Labs and imaging studies are pending at time of signout.  Physical Exam  BP 117/86   Pulse 77   Temp 98.4 F (36.9 C) (Oral)   Resp 16   LMP 11/20/2012   SpO2 99%   Physical Exam General: No acute distress  Procedures  Procedures  ED Course / MDM   Clinical Course as of 11/27/23 2257  Wed Nov 26, 2023  1530 I, Estelle June DO, am transitioning care of this patient to the oncoming provider pending MRI, IV therapy team evaluation, clinical reevaluation and disposition [MP]    Clinical Course User Index [MP] Royanne Foots, DO   Medical Decision Making Amount and/or Complexity of Data Reviewed Labs: ordered. Radiology: ordered.  Risk Prescription drug management. Decision regarding hospitalization.   The patient's MRI actually did appear to be improved.  Her PICC line is not functioning.  The patient started on IV pain medication and a call is placed to hospital service for admission for new PICC line and further management.  She was unable to ambulate here which does seem to be chronic since her surgery.       Durwin Glaze, MD 11/27/23 2258

## 2023-11-26 NOTE — ED Triage Notes (Signed)
EMS stated, pt here cause pic line will not flush.

## 2023-11-27 ENCOUNTER — Other Ambulatory Visit: Payer: Self-pay

## 2023-11-27 DIAGNOSIS — R296 Repeated falls: Secondary | ICD-10-CM | POA: Diagnosis not present

## 2023-11-27 DIAGNOSIS — E669 Obesity, unspecified: Secondary | ICD-10-CM

## 2023-11-27 DIAGNOSIS — M797 Fibromyalgia: Secondary | ICD-10-CM

## 2023-11-27 DIAGNOSIS — M51361 Other intervertebral disc degeneration, lumbar region with lower extremity pain only: Secondary | ICD-10-CM

## 2023-11-27 DIAGNOSIS — Z789 Other specified health status: Secondary | ICD-10-CM

## 2023-11-27 LAB — BASIC METABOLIC PANEL
Anion gap: 9 (ref 5–15)
BUN: 13 mg/dL (ref 8–23)
CO2: 31 mmol/L (ref 22–32)
Calcium: 9.3 mg/dL (ref 8.9–10.3)
Chloride: 104 mmol/L (ref 98–111)
Creatinine, Ser: 0.71 mg/dL (ref 0.44–1.00)
GFR, Estimated: >60 mL/min (ref >60)
Glucose, Bld: 110 mg/dL — ABNORMAL HIGH (ref 70–99)
Potassium: 4.3 mmol/L (ref 3.5–5.1)
Sodium: 144 mmol/L (ref 135–145)

## 2023-11-27 LAB — CBC
HCT: 36.8 % (ref 36.0–46.0)
Hemoglobin: 11.2 g/dL — ABNORMAL LOW (ref 12.0–15.0)
MCH: 25.9 pg — ABNORMAL LOW (ref 26.0–34.0)
MCHC: 30.4 g/dL (ref 30.0–36.0)
MCV: 85 fL (ref 80.0–100.0)
Platelets: 260 10*3/uL (ref 150–400)
RBC: 4.33 MIL/uL (ref 3.87–5.11)
RDW: 13.2 % (ref 11.5–15.5)
WBC: 7.1 10*3/uL (ref 4.0–10.5)
nRBC: 0 % (ref 0.0–0.2)

## 2023-11-27 LAB — TSH: TSH: 0.1 u[IU]/mL — ABNORMAL LOW (ref 0.350–4.500)

## 2023-11-27 LAB — GLUCOSE, CAPILLARY: Glucose-Capillary: 142 mg/dL — ABNORMAL HIGH (ref 70–99)

## 2023-11-27 MED ORDER — HYDROXYZINE HCL 25 MG PO TABS: 50.0000 mg | ORAL_TABLET | Freq: Two times a day (BID) | ORAL | Status: DC | PRN

## 2023-11-27 MED ORDER — DIAZEPAM 5 MG PO TABS
5.0000 mg | ORAL_TABLET | Freq: Every day | ORAL | Status: DC | PRN
  Administered 2023-12-02: 5 mg via ORAL
  Filled 2023-11-27: qty 1

## 2023-11-27 MED ORDER — SODIUM CHLORIDE 0.9% FLUSH: 10.0000 mL | INTRAVENOUS | Status: DC | PRN

## 2023-11-27 MED ORDER — DIAZEPAM 5 MG PO TABS
5.0000 mg | ORAL_TABLET | Freq: Every day | ORAL | Status: DC
  Administered 2023-11-27 – 2023-12-03 (×7): 5 mg via ORAL
  Filled 2023-11-27 (×7): qty 1

## 2023-11-27 MED ORDER — ALBUTEROL SULFATE (2.5 MG/3ML) 0.083% IN NEBU: 3.0000 mL | INHALATION_SOLUTION | Freq: Four times a day (QID) | RESPIRATORY_TRACT | Status: DC | PRN

## 2023-11-27 MED ORDER — PANTOPRAZOLE SODIUM 40 MG PO TBEC
40.0000 mg | DELAYED_RELEASE_TABLET | Freq: Every day | ORAL | Status: DC
  Administered 2023-11-27 – 2023-12-04 (×8): 40 mg via ORAL
  Filled 2023-11-27 (×9): qty 1

## 2023-11-27 MED ORDER — CHLORHEXIDINE GLUCONATE CLOTH 2 % EX PADS
6.0000 | MEDICATED_PAD | Freq: Every day | CUTANEOUS | Status: DC
  Administered 2023-11-27 – 2023-12-04 (×8): 6 via TOPICAL

## 2023-11-27 MED ORDER — VITAMIN D 25 MCG (1000 UNIT) PO TABS
5000.0000 [IU] | ORAL_TABLET | Freq: Every day | ORAL | Status: DC
  Administered 2023-11-27 – 2023-12-04 (×8): 5000 [IU] via ORAL
  Filled 2023-11-27 (×9): qty 5

## 2023-11-27 MED ORDER — DIAZEPAM 5 MG PO TABS: 5.0000 mg | ORAL_TABLET | ORAL | Status: DC

## 2023-11-27 MED ORDER — ASPIRIN 81 MG PO TBEC
81.0000 mg | DELAYED_RELEASE_TABLET | Freq: Every day | ORAL | Status: DC
  Administered 2023-11-27 – 2023-12-04 (×8): 81 mg via ORAL
  Filled 2023-11-27 (×9): qty 1

## 2023-11-27 MED ORDER — SODIUM CHLORIDE 0.9% FLUSH
10.0000 mL | Freq: Two times a day (BID) | INTRAVENOUS | Status: DC
  Administered 2023-11-27 – 2023-12-02 (×12): 10 mL
  Administered 2023-12-03: 40 mL
  Administered 2023-12-03: 10 mL
  Administered 2023-12-04: 40 mL

## 2023-11-27 MED ORDER — GABAPENTIN 400 MG PO CAPS
1200.0000 mg | ORAL_CAPSULE | Freq: Three times a day (TID) | ORAL | Status: DC
  Administered 2023-11-27 – 2023-12-01 (×13): 1200 mg via ORAL
  Filled 2023-11-27 (×3): qty 3
  Filled 2023-11-27: qty 12
  Filled 2023-11-27 (×10): qty 3

## 2023-11-27 NOTE — Progress Notes (Signed)
Peripherally Inserted Central Catheter Placement  The IV Nurse has discussed with the patient and/or persons authorized to consent for the patient, the purpose of this procedure and the potential benefits and risks involved with this procedure.  The benefits include less needle sticks, lab draws from the catheter, and the patient may be discharged home with the catheter. Risks include, but not limited to, infection, bleeding, blood clot (thrombus formation), and puncture of an artery; nerve damage and irregular heartbeat and possibility to perform a PICC exchange if needed/ordered by physician.  Alternatives to this procedure were also discussed.  Bard Power PICC patient education guide, fact sheet on infection prevention and patient information card has been provided to patient /or left at bedside.    PICC Placement Documentation  PICC Single Lumen 11/27/23 Right Brachial 35 cm 0 cm (Active)  Indication for Insertion or Continuance of Line Home intravenous therapies (PICC only) 11/27/23 1200  Exposed Catheter (cm) 0 cm 11/27/23 1200  Site Assessment Clean, Dry, Intact 11/27/23 1200  Line Status Flushed;Saline locked;Blood return noted 11/27/23 1200  Dressing Type Transparent;Securing device 11/27/23 1200  Dressing Status Antimicrobial disc/dressing in place;Clean, Dry, Intact 11/27/23 1200  Line Care Connections checked and tightened 11/27/23 1200  Line Adjustment (NICU/IV Team Only) No 11/27/23 1200  Dressing Intervention New dressing 11/27/23 1200  Dressing Change Due 12/04/23 11/27/23 1200       Franne Grip Renee 11/27/2023, 12:29 PM

## 2023-11-27 NOTE — Evaluation (Signed)
Physical Therapy Evaluation Patient Details Name: [name] MRN: [id number] DOB: Jun 08, 1961 Today's Date: 11/27/2023  History of Present Illness  63yo F who presented to the ED on 11/27/23  63yo F who presented to the ED on 11/27/23  due to PICC line clog, reports of multiple recent falls. Admitted for w/u.PMH active discitis on IV cefepime at home, obesity, multiple spinal surgeries, scoliosis, B TKRs, hx of osteomyelitis, anxiety, fibro, HTN, shoulder surgery  Clinical Impression   Pt received in bed, reported high pain levels this morning due to combo of fibromyalgia and the cold weather we have been having. Able to roll side to side briefly for limited pericare but extremely pain limited this morning. Performed pericare and changed out bed linens as much as possible but this was difficult with +1 due to limited mobility due to pain, nursing staff aware. Left in bed, she refused further repositioning due to pain. Would really benefit from rehab prior to return home especially given her daily falls here recently.         If plan is discharge home, recommend the following: Two people to help with walking and/or transfers;Assistance with cooking/housework;A lot of help with bathing/dressing/bathroom;Assist for transportation;Help with stairs or ramp for entrance   Can travel by private vehicle   No    Equipment Recommendations Other (comment) (well equipped)  Recommendations for Other Services       Functional Status Assessment Patient has had a recent decline in their functional status and demonstrates the ability to make significant improvements in function in a reasonable and predictable amount of time.     Precautions / Restrictions Precautions Precautions: Fall;Other (comment) Precaution/Restrictions Comments: discitis, high fall risk, high pain levels due to fibro Restrictions Weight Bearing Restrictions Per Provider Order: No      Mobility  Bed Mobility Overal bed mobility: Needs Assistance Bed Mobility:  Rolling Rolling: Max assist, Used rails         General bed mobility comments: heavy MaxA of +1 and use of rails for rolling but still not able to roll completely overr for pericare    Transfers                   General transfer comment: DNT    Ambulation/Gait               General Gait Details: DNT  Stairs            Wheelchair Mobility     Tilt Bed    Modified Rankin (Stroke Patients Only)       Balance Overall balance assessment: History of Falls                                           Pertinent Vitals/Pain Pain Assessment Pain Assessment: Faces Faces Pain Scale: Hurts whole lot Pain Location: generalized fibro pain Pain Descriptors / Indicators: Discomfort Pain Intervention(s): Limited activity within patient's tolerance, Monitored during session, Patient requesting pain meds-RN notified    Home Living Family/patient expects to be discharged to:: Private residence Living Arrangements: Alone Available Help at Discharge: Family;Friend(s);Available PRN/intermittently Type of Home: House Home Access: Stairs to enter Entrance Stairs-Rails: Right;Left;Can reach both Entrance Stairs-Number of Steps: 5   Home Layout: One level Home Equipment: Shower seat;Hand held Programmer, systems (2 wheels);Rollator (4 wheels);Grab bars - tub/shower;Cane - single point;Adaptive equipment;BSC/3in1;Crutches Additional Comments: daily falls needing EMS to help her get back up  Prior Function Prior Level of Function : History of Falls (last six months);Driving;Needs assist             Mobility Comments: Mod I with rollator, cane or crutches; [facility] and increased her pain       Extremity/Trunk Assessment   Upper Extremity Assessment Upper Extremity Assessment: Generalized weakness    Lower Extremity Assessment Lower Extremity Assessment: Generalized weakness     Cervical / Trunk Assessment Cervical / Trunk Assessment: Kyphotic  Communication        Cognition Arousal: Alert Behavior During Therapy: WFL for tasks assessed/performed   PT - Cognitive impairments: No apparent impairments                         Following commands: Intact       Cueing       General Comments General comments (skin integrity, edema, etc.): unable to get to EOB to assess balance    Exercises     Assessment/Plan    PT Assessment Patient needs continued PT services  PT Problem List Decreased strength;Decreased mobility;Decreased safety awareness;Decreased coordination;Obesity;Decreased activity tolerance;Decreased balance;Pain       PT Treatment Interventions Therapeutic exercise;Gait training;Balance training;DME instruction;Stair training;Functional mobility training;Patient/family education;Therapeutic activities;Neuromuscular re-education    PT Goals (Current goals can be found in the Care Plan section)  Acute Rehab PT Goals Patient Stated Goal: less pain/no falls PT Goal Formulation: With patient Time For Goal Achievement: 12/11/23 Potential to Achieve Goals: Fair    Frequency Min 1X/week     Co-evaluation               AM-PAC PT "6 Clicks" Mobility  Outcome Measure Help needed turning from your back to your side while in a flat bed without using bedrails?: A Lot Help needed moving from lying on your back to sitting on the side of a flat bed without using bedrails?: Total Help needed moving to and from a bed to a chair (including a wheelchair)?: Total Help needed standing up from a chair using your arms (e.g., wheelchair or bedside chair)?: Total Help needed to walk in hospital room?: Total Help needed climbing 3-5 steps with a railing? : Total 6 Click Score: 7    End of Session   Activity Tolerance: Patient limited by pain Patient left: Other (comment);with call bell/phone within reach (ED stretcher) Nurse  Communication: Mobility status;Need for lift equipment PT Visit Diagnosis: Unsteadiness on feet (R26.81);Muscle weakness (generalized) (M62.81);Repeated falls (R29.6);Difficulty in walking, not elsewhere classified (R26.2)    Time: 1610-9604 PT Time Calculation (min) (ACUTE ONLY): 32 min   Charges:   PT Evaluation $PT Eval Moderate Complexity: 1 Mod PT Treatments $Therapeutic Activity: 8-22 mins PT General Charges $$ ACUTE PT VISIT: 1 Visit      Nedra Hai, PT, DPT 11/27/23 10:34 AM

## 2023-11-27 NOTE — Care Management CC44 (Signed)
Condition Code 44 Documentation Completed  Patient Details  Name: [name] MRN: [id number] Date of Birth: 04/16/1961   Condition Code 44 given:  Yes Patient signature on Condition Code 44 notice:  Yes Documentation of 2 MD's agreement:  Yes Code 44 added to claim:  Yes    Oletta Cohn, RN 11/27/2023, 10:09 AM

## 2023-11-27 NOTE — Plan of Care (Signed)
Patient alert/oriented X4. Patient compliant with medication administration and skin assessed with Delorise Jackson, RN on admission. No skin issues noted. Patient son reports she has had multiple falls within the last six months and feels uncomfortable if she was to go home alone without a support system in place. Patient tolerated IV antibiotics through new PICC line. VSS, no complaints at this time.   Problem: Education: Goal: Knowledge of General Education information will improve Description: Including pain rating scale, medication(s)/side effects and non-pharmacologic comfort measures Outcome: Progressing   Problem: Health Behavior/Discharge Planning: Goal: Ability to manage health-related needs will improve Outcome: Progressing   Problem: Clinical Measurements: Goal: Ability to maintain clinical measurements within normal limits will improve Outcome: Progressing   Problem: Clinical Measurements: Goal: Will remain free from infection Outcome: Progressing   Problem: Clinical Measurements: Goal: Diagnostic test results will improve Outcome: Progressing   Problem: Clinical Measurements: Goal: Respiratory complications will improve Outcome: Progressing   Problem: Clinical Measurements: Goal: Cardiovascular complication will be avoided Outcome: Progressing   Problem: Activity: Goal: Risk for activity intolerance will decrease Outcome: Progressing   Problem: Nutrition: Goal: Adequate nutrition will be maintained Outcome: Progressing   Problem: Coping: Goal: Level of anxiety will decrease Outcome: Progressing   Problem: Elimination: Goal: Will not experience complications related to bowel motility Outcome: Progressing   Problem: Elimination: Goal: Will not experience complications related to urinary retention Outcome: Progressing   Problem: Pain Managment: Goal: General experience of comfort will improve and/or be controlled Outcome: Progressing   Problem: Safety: Goal:  Ability to remain free from injury will improve Outcome: Progressing   Problem: Skin Integrity: Goal: Risk for impaired skin integrity will decrease Outcome: Progressing

## 2023-11-27 NOTE — Progress Notes (Signed)
  Progress Note   Patient: [name] KVQ:[id number] DOB: [birth year] DOA: 11/26/2023     1 DOS: the patient was seen and examined on 11/27/2023   Brief hospital course:   [name] is a 63 y.o. female with medical history significant for active discitis on IV cefepime at home, obesity, multiple spinal surgeries, scoliosis, bilateral knee replacements, and history of distant osteomyelitis who comes in today because her PICC line is clogged and is having intermittent falls at home.  Assessment and Plan:  Discitis/osteomyelitis (POA) - Was receiving cefepime through PICC and home setting however PICC line did become clogged.  Attempts to unclog in the ER were unsuccessful.  PICC line pending replacement.  Continue IV cefepime while inpatient.  Recheck of patient's MRI notes improvement in her discitis.  Noted spinal canal stenosis similar to previous imaging.  Per previous neurosurgery notes from prior hospitalization, had recommended no surgical intervention until infection had cleared.  Physical debilitation muscle weakness with increased falls at home - Patient stating she has had increased falls, states her knees are giving out.  No overt change in her MRI compared to previous hospitalization.  PT/OT ordered.  Patient may benefit from home health versus short-term rehab.  Antibiotic induced diarrhea - Appears to be mild.  No fever or leukocytosis to suggest C. difficile.  Possible hyperthyroidism - TSH 0.1.  No note of patient taking levothyroxine.  Will order free T4 in am.      Subjective: Patient resting comfortably in bed this morning.  Misted just feeling weak.  Denies any fever, chills, chest pain, nausea, vomiting, abdominal pain.  States that her PICC line has been clogged for many days, asked why she came in specifically this time and she did not have any answer.  Physical Exam: Vitals:   11/26/23 1757 11/27/23 0827 11/27/23 0828 11/27/23 1000  BP:   129/74 134/71   Pulse:   96 93  Resp:   18 20  Temp: 97.7 F (36.5 C)  98.6 F (37 C) 98.5 F (36.9 C)  TempSrc: Oral  Oral Oral  SpO2:  96% 96% 96%   GENERAL:  Alert, pleasant, no acute distress  HEENT:  EOMI CARDIOVASCULAR:  RRR, no murmurs appreciated RESPIRATORY:  Clear to auscultation, no wheezing, rales, or rhonchi GASTROINTESTINAL:  Soft, nontender, nondistended EXTREMITIES:  No LE edema bilaterally NEURO:  No new focal deficits appreciated SKIN:  No rashes noted PSYCH:  Appropriate mood and affect   Data Reviewed:  There are no new results to review at this time.  Family Communication: None at bedside  Disposition: Status is: Observation The patient remains OBS appropriate and will d/c before 2 midnights.  Planned Discharge Destination: Home with Home Health    Time spent: 30 minutes  Author: Valree Artis, DO 11/27/2023 1:12 PM  For on call review [email or web address].

## 2023-11-27 NOTE — Progress Notes (Signed)
Transition of Care [facility]) - Inpatient Brief Assessment   Patient Details  Name: [name] MRN: [id number] Date of Birth: [birth year]  Transition [facility]) CM/SW Contact:    Oletta Cohn, RN Phone Number: 11/27/2023, 11:04 AM   Clinical Narrative: Pt from home alone with occasional help from friends and family. Home Equipment: Shower seat;Hand held Programmer, systems (2 wheels);Rollator (4 wheels);Grab bars - tub/shower;Cane - single point;Adaptive equipment;BSC/3in1;Crutches Additional Comments: daily falls needing EMS to help her get back up.  Home Health vs SNF placement.   Transition of Care Asessment: Insurance and Status: (P) Insurance coverage has been reviewed Patient has primary care physician: (P) Yes Home environment has been reviewed: (P) home alone Prior level of function:: (P) independent Prior/Current Home Services: (P) Current home services Social Drivers of Health Review: (P) SDOH reviewed needs interventions Readmission risk has been reviewed: (P) Yes Transition of care needs: (P) transition of care needs identified, TOC will continue to follow

## 2023-11-27 NOTE — TOC Initial Note (Signed)
Transition of Care [facility]) - Initial/Assessment Note    Patient Details  Name: [name] MRN: [id number] Date of Birth: 09-Dec-1960  Transition of Care [facility]) CM/SW Contact:    Marliss Coots, LCSW Phone Number: 11/27/2023, 1:12 PM  Clinical Narrative:                  1:12 PM CSW spoke with patient and informed her of physical therapy recommendation of discharge to SNF. Patient stated that she would speak with her son, Despina Hick, about discharging to SNF vs. Home with home health and relay to CSW decision. Patient consented CSW to speak with [facility], as well.    Barriers to Discharge: Continued Medical Work up   Patient Goals and CMS Choice            Expected Discharge Plan and Services In-house Referral: Clinical Social Work   Post Acute Care Choice: Skilled Nursing Facility                                        Prior Living Arrangements/Services   Lives with:: Self Patient language and need for interpreter reviewed:: Yes Do you feel safe going back to the place where you live?: Yes      Need for Family Participation in Patient Care: Yes (Comment) Care giver support system in place?: Yes (comment)   Criminal Activity/Legal Involvement Pertinent to Current Situation/Hospitalization: No - Comment as needed  Activities of Daily Living   ADL Screening (condition at time of admission) Independently performs ADLs?: No Does the patient have a NEW difficulty with bathing/dressing/toileting/self-feeding that is expected to last >3 days?: Yes (Initiates electronic notice to provider for possible OT consult) Does the patient have a NEW difficulty with getting in/out of bed, walking, or climbing stairs that is expected to last >3 days?: Yes (Initiates electronic notice to provider for possible PT consult) Does the patient have a NEW difficulty with communication that is expected to last >3 days?: No Is the patient deaf or have difficulty hearing?: No Does the  patient have difficulty seeing, even when wearing glasses/contacts?: No Does the patient have difficulty concentrating, remembering, or making decisions?: No  Permission Sought/Granted Permission sought to share information with : Family Supports Permission granted to share information with : Yes, Verbal Permission Granted  Share Information with NAME: Kentrell Enterline     Permission granted to share info w Relationship: Son  Permission granted to share info w Contact Information: [phone]  Emotional Assessment   Attitude/Demeanor/Rapport: Engaged Affect (typically observed): Stable, Accepting, Appropriate, Adaptable, Calm Orientation: : Oriented to Self, Oriented to Place, Oriented to  Time, Oriented to Situation Alcohol / Substance Use: Not Applicable Psych Involvement: No (comment)  Admission diagnosis:  History of osteomyelitis [Z87.39] Occluded PICC line, initial encounter (HCC) [T82.898A] Weakness of both lower extremities [R29.898] Problem with vascular access [Z78.9] Patient Active Problem List   Diagnosis Date Noted   Falls frequently 11/27/2023   Problem with vascular access 11/26/2023   Diskitis 10/28/2023   Wound infection after surgery 05/14/2022   Delayed surgical wound healing 05/12/2022   Postoperative wound infection 04/08/2022   Scoliosis due to degenerative disease of spine in adult patient 03/30/2022   HNP (herniated nucleus pulposus), cervical 12/08/2020   Lumbago with sciatica, unspecified side 07/28/2020   PICC (peripherally inserted central catheter) in place 03/16/2020   Encounter for long-term (current) use of antibiotics  03/16/2020   Polypharmacy 03/09/2020   At risk for adverse drug event 03/09/2020   MSSA bacteremia 02/29/2020   Postoperative infection 02/28/2020   Abnormal gait due to muscle weakness 02/26/2020   HNP (herniated nucleus pulposus), lumbar 02/16/2020   Muscle weakness of lower extremity 02/14/2020   Spondylolisthesis of lumbar  region 10/30/2017   Painful orthopaedic hardware right ankle 05/27/2016   Closed fracture of distal end of right fibula and tibia    Ankle syndesmosis disruption    ASCUS favor benign 01/06/2014   Leg swelling 12/22/2013   Seasonal allergies    Anemia    Chronic back pain due to DJD with history surgery    Hyperthyroidism    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 03/24/2012   Status post left total prosthetic replacement of knee joint using cement 03/24/2012   Fibromyalgia    GERD (gastroesophageal reflux disease)    Anxiety    Depression    Hypertension    Lumbar degenerative disc disease 09/25/2011   PCP:  Irven Coe, MD Pharmacy:   [facility] - [place], [name] - [address] [name] [address] Phone: [phone] Fax: [phone]  [id number] [place], [name] - [address] AT [facility] [address] [name] [address] Phone: [phone] Fax: [phone]     Social Drivers of Health (SDOH) Social History: SDOH Screenings   Food Insecurity: No Food Insecurity (11/27/2023)  Housing: Low Risk  (11/27/2023)  Transportation Needs: No Transportation Needs (11/27/2023)  Utilities: Not At Risk (11/27/2023)  Depression (PHQ2-9): Low Risk  (01/16/2023)  Tobacco Use: Medium Risk (10/29/2023)   SDOH Interventions:     Readmission Risk Interventions    05/17/2022    3:02 PM  Readmission Risk Prevention Plan  Transportation Screening Complete  PCP or Specialist Appt within 5-7 Days Complete  Home Care Screening Complete  Medication Review (RN CM) Complete

## 2023-11-27 NOTE — Care Management Obs Status (Signed)
MEDICARE OBSERVATION STATUS NOTIFICATION   Patient Details  Name: [name] MRN: [id number] Date of Birth: 16-Apr-1961   Medicare Observation Status Notification Given:  Yes    Oletta Cohn, RN 11/27/2023, 10:08 AM

## 2023-11-27 NOTE — H&P (Addendum)
History and Physical    Patient: [name] ZOX:[id number] DOB: Feb 17, 1961 DOA: 11/26/2023 DOS: the patient was seen and examined on 11/27/2023 PCP: Irven Coe, MD  Patient coming from: Home  Chief Complaint:  Chief Complaint  Patient presents with   IV access unable to flush   HPI: [name] is a 63 y.o. female with medical history significant for active discitis on IV cefepime at home, obesity, multiple spinal surgeries, scoliosis, bilateral knee replacements, and history of distant osteomyelitis who comes in today because her PICC line is clogged.  The home nurse was out at her house last week to try to change it but it is still not working so she was advised to come to the emergency department.  The patient has missed as many as 5 days of antibiotics with this PICC line being clogged. He is not having fevers or chills.  She does have nausea from the antibiotics and a little bit of increased diarrhea. In addition to the PICC issues the patient also reports that she has been having trouble falling.  This has been going on for the last 2 months and is not acute or not changed however it is significantly affecting her quality of life.  The patient lives alone.  She has followed up with some of her specialist namely Richelle Ito for her knees and Dr. Franky Macho, neurosurgeon to try to figure out what the cause of her increased falling is. She is falling almost every single day.  When she falls she has to call a family member or EMS to help her up.  She is not lightheaded or dizzy she says sometimes when she gets up her knees just give away.  She will be admitted to the hospitalist service for further evaluation.  He did have a MRI of her L-spine and T-spine.  Review of Systems: As mentioned in the history of present illness. All other systems reviewed and are negative. Past Medical History:  Diagnosis Date   Anemia    Ankle syndesmosis disruption    Anxiety    takes Ativan and  Valium, after mother passed   Arthritis    bilateral knees s/p knee replacement bilaterally   Asthma    Bronchitis    Bruising    pt states unexplained d/t fibromyalgia   Chronic back pain    2012 tailbone surgery and 3 lower discs.    Closed fracture of distal end of right fibula and tibia    Depression    from Fibromyalgia diagnosis; not taking medicine. since 2001   Dizziness    rarely   Fibromyalgia    diagnosed 2001   GERD (gastroesophageal reflux disease)    Prilosec occasionally   Headache(784.0)    "sinus headaches"   History of hiatal hernia    Hypertension    since 2013   Hyperthyroidism    subclinical, no treatment; thyroid nodules   IBS (irritable bowel syndrome)    Impaired memory    states from fibromyalgia   Insomnia    takes Ambien   Jones fracture    left foot fifth metatarsal   Multiple allergies    including latex, pet dander, shellfish, pet dander   Painful orthopaedic hardware right ankle 05/27/2016   Seasonal allergies    Shortness of breath    Occasional with exertion;    Sore gums    this is why pt is on Amoxil-only takes for dental work   Tachycardia    Thyroid goiter  Varicose vein    protrudes above skin-per pt;vein popped and bruised;ultrasound done to make sure that there were no clots;noclots were found   Past Surgical History:  Procedure Laterality Date   ANTERIOR CERVICAL DECOMP/DISCECTOMY FUSION N/A 12/08/2020   Procedure: Cervical Four-Five Anterior cervical decompression/discectomy/fusion;  Surgeon: Coletta Memos, MD;  Location: [facility] OR;  Service: Neurosurgery;  Laterality: N/A;  anterior   ANTERIOR LUMBAR FUSION  09/20/2011   Procedure: ANTERIOR LUMBAR FUSION 1 LEVEL;  Surgeon: Carmela Hurt;  Location: [facility];  Service: Neurosurgery;  Laterality: N/A;  Lumbar five-Sacral One Anterior Lumbar Interbody Fusion /Dr. Early to Approach    APPLICATION OF WOUND VAC N/A 05/14/2022   Procedure: Removal of wound vac with closure of  wound;  Surgeon: Coletta Memos, MD;  Location: [facility] OR;  Service: Neurosurgery;  Laterality: N/A;  Pt to be admitted on 05-12-2022   CERVICAL DISC SURGERY     WITH TITANIUM PLATE IN NECK---LEFT SIDE   CERVICAL SPINE SURGERY  03/24/2019   CESAREAN SECTION     EYE SURGERY Bilateral 2023   cataract   fibroidectomy     FRACTURE SURGERY  05/08/2010   Jones fracture left foot fifth metatarsal   HARDWARE REMOVAL Right 05/28/2016   Procedure: RIGHT ANKLE HARDWARE REMOVAL;  Surgeon: Salvatore Marvel, MD;  Location: [facility];  Service: Orthopedics;  Laterality: Right;   HARDWARE REMOVAL N/A 07/28/2020   Procedure: Removal of Lumbar Hardware;  Surgeon: Coletta Memos, MD;  Location: [facility] OR;  Service: Neurosurgery;  Laterality: N/A;   HERNIA REPAIR     hiatal hernia   IR THORACIC DISC ASPIRATION W/IMG GUIDE  10/29/2023   IRRIGATION AND DEBRIDEMENT KNEE  04/09/2012   Procedure: IRRIGATION AND DEBRIDEMENT KNEE;  Surgeon: Thera Flake., MD;  Location: [facility] OR;  Service: Orthopedics;  Laterality: Right;   JOINT REPLACEMENT  01/27/2012   left total knee and Right total knee   KNEE SURGERY  2005   Left knee arthroscopy   LAMINECTOMY WITH POSTERIOR LATERAL ARTHRODESIS LEVEL 4 Bilateral 03/22/2022   Procedure: Thoracic ten to Lumbar three Posterior lateral arthrodesis with screws;  Surgeon: Coletta Memos, MD;  Location: [facility] OR;  Service: Neurosurgery;  Laterality: Bilateral;   LUMBAR WOUND DEBRIDEMENT N/A 04/08/2022   Procedure: LUMBAR WOUND DEBRIDEMENT;  Surgeon: Coletta Memos, MD;  Location: [facility] OR;  Service: Neurosurgery;  Laterality: N/A;   NASAL SEPTOPLASTY W/ TURBINOPLASTY  2007   due to recurrent sinusitis   ORIF ANKLE FRACTURE Right 06/21/2015   Procedure: OPEN REDUCTION INTERNAL FIXATION RIGHT DISTAL FIBULA  FRACTURE AND OPEN REDUCTION INTERNAL FIXATION SYNDESMOSIS ;  Surgeon: Salvatore Marvel, MD;  Location: [facility];  Service: Orthopedics;  Laterality: Right;   PARTIAL  HYSTERECTOMY     SHOULDER ARTHROSCOPY W/ ROTATOR CUFF REPAIR Right    SHOULDER SURGERY Left    SPINE SURGERY  2004   Cervical plate, ACDF   STERIOD INJECTION  01/27/2012   Procedure: STEROID INJECTION;  Surgeon: Nilda Simmer, MD;  Location: [facility] OR;  Service: Orthopedics;  Laterality: Right;   TEE WITHOUT CARDIOVERSION N/A 03/01/2020   Procedure: TRANSESOPHAGEAL ECHOCARDIOGRAM (TEE);  Surgeon: Jake Bathe, MD;  Location: [facility];  Service: Cardiovascular;  Laterality: N/A;   THORACIC DISCECTOMY  02/16/2020   Procedure: Thoracic ten-eleven Discectomy;  Surgeon: Coletta Memos, MD;  Location: [facility] OR;  Service: Neurosurgery;;   TONSILLECTOMY  2007   TOTAL KNEE ARTHROPLASTY  01/27/2012   Procedure: TOTAL KNEE ARTHROPLASTY;  Surgeon: Nilda Simmer, MD;  Bilateral   TOTAL KNEE ARTHROPLASTY  04/06/2012   Procedure: TOTAL KNEE ARTHROPLASTY;  Surgeon: Nilda Simmer, MD;  Location: [facility] OR;  Service: Orthopedics;  Laterality: Right;   TUBAL LIGATION     UTERINE FIBROID SURGERY     mid 200s   WOUND EXPLORATION N/A 02/27/2020   Procedure: WOUND EXPLORATION;  Surgeon: Coletta Memos, MD;  Location: [facility] OR;  Service: Neurosurgery;  Laterality: N/A;,  (wound vac upper back)   WOUND EXPLORATION N/A 03/30/2022   Procedure: THORACIC WOUND EXPLORATION WITH REPAIR OF CSF LEAK;  Surgeon: Coletta Memos, MD;  Location: [facility] OR;  Service: Neurosurgery;  Laterality: N/A;   Social History:  reports that she quit smoking about 24 years ago. Her smoking use included cigarettes. She started smoking about 35 years ago. She has a 5 pack-year smoking history. She has never used smokeless tobacco. She reports that she does not drink alcohol and does not use drugs.  Allergies  Allergen Reactions   Monosodium Glutamate Anaphylaxis and Swelling    Eyes swollen shut, facial swelling, tongue swelling.   Shellfish Allergy Anaphylaxis   Baclofen Nausea Only    Dizziness and increase muscle spasm   Celebrex [Celecoxib]  Itching    Only allergic to generic brand   Contrast Media [Iodinated Contrast Media] Itching and Nausea Only    "could not walk"   Diclofenac Itching    Generic Diclefenac gel causes itching. Can take the name brand Voltaren gel   Gadolinium     Allergic to MRI contrast dye per patient.   Other Other (See Comments)    Pet dander,   Green rubber piece to hold mouth open (oval shape)     Latex Itching and Rash    Latex glove with powder    Family History  Problem Relation Age of Onset   Stroke Father        passed 2004 from pneumonia   Alcohol abuse Father    Pulmonary embolism Mother        after minor knee surgery leading to DVT   Arthritis Sister    Depression Sister    Hypertension Sister    Diabetes Other        grandmother   Anesthesia problems Neg Hx    Hypotension Neg Hx    Malignant hyperthermia Neg Hx    Pseudochol deficiency Neg Hx     Prior to Admission medications   Medication Sig Start Date End Date Taking? Authorizing Provider  acetaminophen (TYLENOL) 325 MG tablet Take 2 tablets (650 mg total) by mouth every 6 (six) hours as needed for mild pain (pain score 1-3) (or Fever >/= 101). 11/01/23   Marguerita Merles Latif, DO  albuterol (VENTOLIN HFA) 108 (90 Base) MCG/ACT inhaler Inhale 2 puffs into the lungs every 6 (six) hours as needed for wheezing or shortness of breath. 03/24/20   Medina-Vargas, Monina C, NP  ascorbic acid (VITAMIN C) 500 MG tablet Take 500 mg by mouth daily.    [provider]  aspirin EC 81 MG tablet Take 81 mg by mouth in the morning.    [provider]  atenolol (TENORMIN) 50 MG tablet Take 1 tablet (50 mg total) by mouth daily. Patient taking differently: Take 50 mg by mouth in the morning. 03/24/20   Medina-Vargas, Monina C, NP  ceFEPime (MAXIPIME) IVPB Inject 2 g into the vein every 8 (eight) hours. Indication:  Discitis/osteomyelitis  First Dose: Yes Last Day of  Therapy:  12/11/23 Labs - Once weekly:  CBC/D and BMP, Labs  - Once weekly: ESR and CRP Method of administration: IV Push Method of administration may be changed at the discretion of home infusion pharmacist based upon assessment of the patient and/or caregiver's ability to self-administer the medication ordered. 11/01/23 12/12/23  Marguerita Merles Latif, DO  chlorzoxazone (PARAFON) 500 MG tablet Take 1 tablet (500 mg total) by mouth 3 (three) times daily. Patient taking differently: Take 500 mg by mouth 4 (four) times daily. 03/24/20   Medina-Vargas, Monina C, NP  Cholecalciferol (VITAMIN D-3 PO) Take 1 tablet by mouth in the morning.    [provider]  cyanocobalamin (VITAMIN B12) 1000 MCG tablet Take 1,000 mcg by mouth once a week. Patient not taking: Reported on 10/28/2023    [provider]  cyclobenzaprine (FLEXERIL) 10 MG tablet Take 10 mg by mouth 3 (three) times daily.    [provider]  diazepam (VALIUM) 5 MG tablet Take 1 tablet (5 mg total) by mouth 2 (two) times daily. Patient taking differently: Take 5 mg by mouth See admin instructions. Take 5 mg at bedtime and additional 5 mg if needed for anxiety 03/22/20   Medina-Vargas, Monina C, NP  diclofenac Sodium (VOLTAREN) 1 % GEL Apply 2 g topically 4 (four) times daily as needed (pain).    [provider]  diphenhydrAMINE (BENADRYL) 25 MG tablet Take 50 mg by mouth in the morning, at noon, in the evening, and at bedtime.    [provider]  ferrous sulfate 325 (65 FE) MG EC tablet Take 325 mg by mouth in the morning.    [provider]  gabapentin (NEURONTIN) 600 MG tablet Take 2 tablets (1,200 mg total) by mouth 3 (three) times daily. 03/24/20   Medina-Vargas, Monina C, NP  hydrOXYzine (ATARAX/VISTARIL) 50 MG tablet Take 50 mg by mouth 2 (two) times daily as needed for anxiety (sleep). 06/23/20   [provider]  lubiprostone (AMITIZA) 24 MCG capsule Take 1 capsule (24 mcg total) by mouth 2 (two) times daily with a meal. Patient taking  differently: Take 48 mcg by mouth daily before lunch. 03/24/20   Medina-Vargas, Monina C, NP  methocarbamol (ROBAXIN) 500 MG tablet Take 500 mg by mouth 4 (four) times daily.    [provider]  Multiple Minerals-Vitamins (CALCIUM-MAGNESIUM-ZINC-D3 PO) Take 1 tablet by mouth in the morning, at noon, and at bedtime.    [provider]  Multiple Vitamin (MULTIVITAMIN WITH MINERALS) TABS tablet Take 1 tablet by mouth in the morning.    [provider]  NARCAN 4 MG/0.1ML LIQD nasal spray kit Place 1 spray into the nose once. Patient taking differently: Place 1 spray into the nose once as needed (opioid overdose). 05/26/20   [provider]  omeprazole (PRILOSEC) 20 MG capsule Take 20 mg by mouth in the morning and at bedtime.     [provider]  ondansetron (ZOFRAN) 4 MG tablet Take 1 tablet (4 mg total) by mouth every 6 (six) hours as needed for nausea. 11/01/23   Marguerita Merles Latif, DO  oxyCODONE-acetaminophen (PERCOCET) 10-325 MG tablet Take 1 tablet by mouth every 6 (six) hours as needed for pain. Patient taking differently: Take 1 tablet by mouth 5 (five) times daily as needed for pain. 03/22/20   Medina-Vargas, Monina C, NP  senna (SENOKOT) 8.6 MG TABS tablet Take 2 tablets by mouth daily before lunch. Take with Amitiza Patient not taking: Reported on 10/28/2023  [provider]    Physical Exam: Vitals:   11/26/23 1318 11/26/23 1500 11/26/23 1745 11/26/23 1757  BP:  117/86 138/69   Pulse: 72 77 78   Resp:  16 17   Temp:    97.7 F (36.5 C)  TempSrc:    Oral  SpO2: 100% 99% 100%    Physical Exam:  General: No acute distress, well developed, obese HEENT: Normocephalic, atraumatic, PERRL Cardiovascular: Normal rate and rhythm. Distal pulses intact. Pulmonary: Normal pulmonary effort, normal breath sounds Gastrointestinal: Nondistended abdomen, soft, non-tender, normoactive bowel sounds Musculoskeletal:No lower ext edema Skin: Skin  is warm and dry. Neuro: strength testing deferred,  AAOx3. PSYCH: Attentive and cooperative  Data Reviewed:  Results for orders placed or performed during the hospital encounter of 11/26/23 (from the past 24 hours)  Comprehensive metabolic panel     Status: Abnormal   Collection Time: 11/26/23  3:25 PM  Result Value Ref Range   Sodium 142 135 - 145 mmol/L   Potassium 3.0 (L) 3.5 - 5.1 mmol/L   Chloride 97 (L) 98 - 111 mmol/L   CO2 30 22 - 32 mmol/L   Glucose, Bld 80 70 - 99 mg/dL   BUN 12 8 - 23 mg/dL   Creatinine, Ser 1.61 0.44 - 1.00 mg/dL   Calcium 9.5 8.9 - 09.6 mg/dL   Total Protein 6.3 (L) 6.5 - 8.1 g/dL   Albumin 3.5 3.5 - 5.0 g/dL   AST 28 15 - 41 U/L   ALT 24 0 - 44 U/L   Alkaline Phosphatase 112 38 - 126 U/L   Total Bilirubin 0.3 0.0 - 1.2 mg/dL   GFR, Estimated >04 >54 mL/min   Anion gap 15 5 - 15  CBC with Differential     Status: Abnormal   Collection Time: 11/26/23  3:25 PM  Result Value Ref Range   WBC 8.2 4.0 - 10.5 K/uL   RBC 5.05 3.87 - 5.11 MIL/uL   Hemoglobin 13.0 12.0 - 15.0 g/dL   HCT 09.8 11.9 - 14.7 %   MCV 84.8 80.0 - 100.0 fL   MCH 25.7 (L) 26.0 - 34.0 pg   MCHC 30.4 30.0 - 36.0 g/dL   RDW 82.9 56.2 - 13.0 %   Platelets 290 150 - 400 K/uL   nRBC 0.0 0.0 - 0.2 %   Neutrophils Relative % 57 %   Neutro Abs 4.8 1.7 - 7.7 K/uL   Lymphocytes Relative 26 %   Lymphs Abs 2.1 0.7 - 4.0 K/uL   Monocytes Relative 11 %   Monocytes Absolute 0.9 0.1 - 1.0 K/uL   Eosinophils Relative 5 %   Eosinophils Absolute 0.4 0.0 - 0.5 K/uL   Basophils Relative 1 %   Basophils Absolute 0.0 0.0 - 0.1 K/uL   Immature Granulocytes 0 %   Abs Immature Granulocytes 0.01 0.00 - 0.07 K/uL  Sedimentation rate     Status: Abnormal   Collection Time: 11/26/23  3:25 PM  Result Value Ref Range   Sed Rate 25 (H) 0 - 22 mm/hr  C-reactive protein     Status: Abnormal   Collection Time: 11/26/23  3:25 PM  Result Value Ref Range   CRP 2.4 (H) <1.0 mg/dL     Assessment and  Plan: Clogged PICC line -several attempts were made in the emergency department to declot her PICC line but they were unsuccessful.  She will be admitted and her PICC line exchanged in the a.m.  She still has at  least 3 weeks of IV antibiotics remaining for her osteomyelitis. Increased falls -the patient has been falling much more than usual, almost every day now for the last 2 months.  This will cause of her increased falls is not clear at this time.  The patient thought it was her knees just giving way and she went to see her orthopedic doctors at [facility].  They did not seem to think it was her knees and referred her to neurology. -She is willing to consider inpatient rehab which has been offered to her in the past.  The patient lives alone and has had to call EMS almost daily to help pick her up.  This is not her baseline.  She does not think she can tolerate 3 full hours a day of PT, but she does need some intervention. -The question however is why is she falling so much now.  She has an extensive list of back problems and has had multiple surgeries however the falls are new.  Consider consulting Dr. Franky Macho of neurosurgery. - PT/OT may be able to help understand where the problem is   3. Diarrhea due to antibiotics -will check for C. difficile though diarrhea is mild.  He continues to take her Amitiza.  I wonder if that should be decreased maybe to every other day for now.    Advance Care Planning:   Code Status: Full Code the patient names her son, Despina Hick as her surrogate decision maker and wants to be full code  Consults: none  Family Communication: none  Severity of Illness: The appropriate patient status for this patient is INPATIENT. Inpatient status is judged to be reasonable and necessary in order to provide the required intensity of service to ensure the patient's safety. The patient's presenting symptoms, physical exam findings, and initial radiographic and laboratory data in  the context of their chronic co Morbidities is felt to place them at high risk for further clinical deterioration. Furthermore, it is not anticipated that the patient will be medically stable for discharge from the hospital within 2 midnights of admission.   * I certify that at the point of admission it is my clinical judgment that the patient will require inpatient hospital care spanning beyond 2 midnights from the point of admission due to high intensity of service, high risk for further deterioration and high frequency of surveillance required.*  Author: Buena Irish, MD 11/27/2023 3:07 AM  For on call review [email or web address].

## 2023-11-28 DIAGNOSIS — Z789 Other specified health status: Secondary | ICD-10-CM | POA: Diagnosis not present

## 2023-11-28 LAB — CBC
HCT: 35.4 % — ABNORMAL LOW (ref 36.0–46.0)
Hemoglobin: 10.7 g/dL — ABNORMAL LOW (ref 12.0–15.0)
MCH: 25.9 pg — ABNORMAL LOW (ref 26.0–34.0)
MCHC: 30.2 g/dL (ref 30.0–36.0)
MCV: 85.7 fL (ref 80.0–100.0)
Platelets: 243 10*3/uL (ref 150–400)
RBC: 4.13 MIL/uL (ref 3.87–5.11)
RDW: 13.3 % (ref 11.5–15.5)
WBC: 6.9 10*3/uL (ref 4.0–10.5)
nRBC: 0 % (ref 0.0–0.2)

## 2023-11-28 LAB — BASIC METABOLIC PANEL
Anion gap: 9 (ref 5–15)
BUN: 14 mg/dL (ref 8–23)
CO2: 30 mmol/L (ref 22–32)
Calcium: 9 mg/dL (ref 8.9–10.3)
Chloride: 104 mmol/L (ref 98–111)
Creatinine, Ser: 0.65 mg/dL (ref 0.44–1.00)
GFR, Estimated: >60 mL/min (ref >60)
Glucose, Bld: 109 mg/dL — ABNORMAL HIGH (ref 70–99)
Potassium: 3.9 mmol/L (ref 3.5–5.1)
Sodium: 143 mmol/L (ref 135–145)

## 2023-11-28 LAB — MAGNESIUM: Magnesium: 1.9 mg/dL (ref 1.7–2.4)

## 2023-11-28 LAB — GLUCOSE, CAPILLARY
Glucose-Capillary: 111 mg/dL — ABNORMAL HIGH (ref 70–99)
Glucose-Capillary: 112 mg/dL — ABNORMAL HIGH (ref 70–99)

## 2023-11-28 LAB — T4, FREE: Free T4: 0.88 ng/dL (ref 0.61–1.12)

## 2023-11-28 NOTE — TOC Progression Note (Addendum)
Transition of Care [facility]) - Progression Note    Patient Details  Name: [name] MRN: [id number] Date of Birth: August 23, 1961  Transition of Care [facility]) CM/SW Contact  Marliss Coots, LCSW Phone Number: 11/28/2023, 12:12 PM  Clinical Narrative:     12:12 PM Per hospitalist, patient decided to discharge home with home health vs SNF. CSW informed RNCM of patient's decision.  12:25 PM Patient's son, Despina Hick, and sister, Luster Landsberg, called CSW to express concerns of patient discharging home with home health. Family requested to speak with hospitalist. CSW relayed request to hospitalist.  2:01 PM Per RNCM, patient is now agreeable to SNFs within [facility] (besides [facility]).     Barriers to Discharge: Continued Medical Work up  Expected Discharge Plan and Services In-house Referral: Clinical Social Work   Post Acute Care Choice: Skilled Nursing Facility   Expected Discharge Date: 11/28/23                                     Social Determinants of Health (SDOH) Interventions SDOH Screenings   Food Insecurity: No Food Insecurity (11/27/2023)  Housing: Low Risk  (11/27/2023)  Transportation Needs: No Transportation Needs (11/27/2023)  Utilities: Not At Risk (11/27/2023)  Depression (PHQ2-9): Low Risk  (01/16/2023)  Tobacco Use: Medium Risk (10/29/2023)    Readmission Risk Interventions    05/17/2022    3:02 PM  Readmission Risk Prevention Plan  Transportation Screening Complete  PCP or Specialist Appt within 5-7 Days Complete  Home Care Screening Complete  Medication Review (RN CM) Complete

## 2023-11-28 NOTE — Discharge Summary (Addendum)
Physician Discharge Summary   Patient: [name] MRN: [id number] DOB: Dec 20, 1960  Admit date:     11/26/2023  Discharge date: 11/28/23  Discharge Physician: Elita Artis   PCP: Irven Coe, MD    Addendum :  after multiple discussions with patient's family, case manager, and prolonged discussion with between the physician and family, patient herself is decided to reconsider PT rehab at [facility].  At this time will undo discharge home with home health.  Will work closely with case management on disposition planning.  Will anticipate discharge in 1-3 days pending Auth.  Discharge planning time: 45 minutes   Recommendations at discharge:   At this time patient will be discharged home.  If you experience any symptoms such as fever, vomiting, shortness of breath, chest pain, abdominal pain, or other concerning symptoms, please call your primary care provider or go to the emergency department immediately.  Discharge Diagnoses: Principal Problem:   Problem with vascular access Active Problems:   Lumbar degenerative disc disease   Fibromyalgia   Hyperthyroidism   Falls frequently  Resolved Problems:   * No resolved hospital problems. *  Hospital Course:  [name] is a 63 y.o. female with medical history significant for active discitis on IV cefepime at home, obesity, multiple spinal surgeries, scoliosis, bilateral knee replacements, and history of distant osteomyelitis who comes in today because her PICC line is clogged and is having intermittent falls at home.   Assessment and Plan:   Discitis/osteomyelitis (POA) - Was receiving cefepime through PICC and home setting however PICC line did become clogged.  Attempts to unclog in the ER were unsuccessful.  PICC line subsequently replaced while inpatient.  Continue IV cefepime while inpatient as well as on discharge per previous regimen.  Recheck of patient's MRI notes improvement in her discitis.  Noted spinal canal stenosis  similar to previous imaging.  Per previous neurosurgery notes from prior hospitalization, had recommended no surgical intervention until infection had cleared.   Physical debilitation muscle weakness with increased falls at home - Patient stating she has had increased falls, states her knees are giving out.  No overt change in her MRI compared to previous hospitalization.  Patient evaluated by PT/OT.  Patient herself adamantly denying wanting to visit a skilled nursing facility.  Compromised to transition to home with home health.    Antibiotic induced diarrhea - Appears to be mild.  No fever or leukocytosis to suggest C. difficile.   Possible hyperthyroidism - TSH 0.1.  No note of patient taking levothyroxine.  Free T4 normal.  Patient herself says that she is followed with her thyroid function in the outpatient setting and her TSH is always low.  Likely subclinical hyperthyroid.       Consultants: None Procedures performed: None Disposition: Home health Diet recommendation:  Discharge Diet Orders (From admission, onward)     Start     Ordered   11/28/23 0000  Diet - low sodium heart healthy        11/28/23 1158           Cardiac diet DISCHARGE MEDICATION: Allergies as of 11/28/2023       Reactions   Monosodium Glutamate Anaphylaxis, Swelling   Eyes swollen shut, facial swelling, tongue swelling.   Shellfish Allergy Anaphylaxis   Baclofen Nausea Only   Dizziness and increase muscle spasm   Celebrex [celecoxib] Itching   Only allergic to generic brand   Contrast Media [iodinated Contrast Media] Itching, Nausea Only   "  could not walk"   Diclofenac Itching   Generic Diclefenac gel causes itching. Can take the name brand Voltaren gel   Gadolinium    Allergic to MRI contrast dye per patient.   Other Other (See Comments)   Pet dander,    Latex Itching, Rash   Latex glove with powder; bite blocks used for EGD studies        Medication List     TAKE these  medications    acetaminophen 325 MG tablet Commonly known as: TYLENOL Take 2 tablets (650 mg total) by mouth every 6 (six) hours as needed for mild pain (pain score 1-3) (or Fever >/= 101).   albuterol 108 (90 Base) MCG/ACT inhaler Commonly known as: VENTOLIN HFA Inhale 2 puffs into the lungs every 6 (six) hours as needed for wheezing or shortness of breath.   ascorbic acid 500 MG tablet Commonly known as: VITAMIN C Take 500 mg by mouth daily.   aspirin EC 81 MG tablet Take 81 mg by mouth in the morning.   atenolol 50 MG tablet Commonly known as: TENORMIN Take 1 tablet (50 mg total) by mouth daily.   CALCIUM-MAGNESIUM-ZINC-D3 PO Take 1 tablet by mouth in the morning, at noon, and at bedtime.   ceFEPime IVPB Commonly known as: MAXIPIME Inject 2 g into the vein every 8 (eight) hours. Indication:  Discitis/osteomyelitis  First Dose: Yes Last Day of Therapy:  12/11/23 Labs - Once weekly:  CBC/D and BMP, Labs - Once weekly: ESR and CRP Method of administration: IV Push Method of administration may be changed at the discretion of home infusion pharmacist based upon assessment of the patient and/or caregiver's ability to self-administer the medication ordered.   chlorzoxazone 500 MG tablet Commonly known as: PARAFON Take 1 tablet (500 mg total) by mouth 3 (three) times daily. What changed: when to take this   cyanocobalamin 1000 MCG tablet Commonly known as: VITAMIN B12 Take 1,000 mcg by mouth once a week.   cyclobenzaprine 10 MG tablet Commonly known as: FLEXERIL Take 10 mg by mouth 3 (three) times daily.   diazepam 5 MG tablet Commonly known as: VALIUM Take 1 tablet (5 mg total) by mouth 2 (two) times daily. What changed:  when to take this additional instructions   diclofenac Sodium 1 % Gel Commonly known as: VOLTAREN Apply 2 g topically 4 (four) times daily as needed (pain).   diphenhydrAMINE 25 MG tablet Commonly known as: BENADRYL Take 50 mg by mouth in the  morning, at noon, in the evening, and at bedtime.   ferrous sulfate 325 (65 FE) MG EC tablet Take 325 mg by mouth in the morning.   gabapentin 600 MG tablet Commonly known as: NEURONTIN Take 2 tablets (1,200 mg total) by mouth 3 (three) times daily.   hydrOXYzine 50 MG tablet Commonly known as: ATARAX Take 50 mg by mouth 2 (two) times daily as needed for anxiety (sleep).   lubiprostone 24 MCG capsule Commonly known as: Amitiza Take 1 capsule (24 mcg total) by mouth 2 (two) times daily with a meal. What changed:  how much to take when to take this   methocarbamol 500 MG tablet Commonly known as: ROBAXIN Take 500 mg by mouth 4 (four) times daily.   Narcan 4 MG/0.1ML Liqd nasal spray kit Generic drug: naloxone Place 1 spray into the nose once. What changed:  when to take this reasons to take this   omeprazole 20 MG capsule Commonly known as: PRILOSEC Take 20 mg  by mouth in the morning and at bedtime.   ondansetron 4 MG tablet Commonly known as: ZOFRAN Take 1 tablet (4 mg total) by mouth every 6 (six) hours as needed for nausea.   oxyCODONE-acetaminophen 10-325 MG tablet Commonly known as: PERCOCET Take 1 tablet by mouth every 6 (six) hours as needed for pain. What changed: when to take this   VITAMIN D-3 PO Take 1 tablet by mouth in the morning.        Discharge Exam: There were no vitals filed for this visit.   GENERAL:  Alert, pleasant, no acute distress  HEENT:  EOMI CARDIOVASCULAR:  RRR, no murmurs appreciated RESPIRATORY:  Clear to auscultation, no wheezing, rales, or rhonchi GASTROINTESTINAL:  Soft, nontender, nondistended EXTREMITIES:  No LE edema bilaterally NEURO:  No new focal deficits appreciated SKIN:  No rashes noted PSYCH:  Appropriate mood and affect   Condition at discharge: improving  The results of significant diagnostics from this hospitalization (including imaging, microbiology, ancillary and laboratory) are listed below for  reference.   Imaging Studies: [place] EKG Site Rite Result Date: 11/27/2023 If [facility] image not attached, placement could not be confirmed due to current cardiac rhythm.  DG Chest Portable 1 View Result Date: 11/26/2023 CLINICAL DATA:  PICC malfunction EXAM: PORTABLE CHEST 1 VIEW COMPARISON:  None Available. FINDINGS: Hardware in the cervical spine. Low lung volumes. Atelectasis or scarring in the right infrahilar lung. Borderline cardiac enlargement. No pleural effusion or pneumothorax. Aortic atherosclerosis. Right upper extremity central venous catheter with looped segment at the right axilla and slightly kinked appearance. The tip overlies the SVC origin. Posterior spinal rods in the lower thoracic spine IMPRESSION: 1. Right upper extremity central venous catheter with looped segment at the right axilla and slightly kinked appearance. The tip overlies the SVC origin. 2. Low lung volumes with atelectasis or scarring in the right infrahilar lung. Electronically Signed   By: Jasmine Pang M.D.   On: 11/26/2023 18:24   MR THORACIC SPINE WO CONTRAST Result Date: 11/26/2023 CLINICAL DATA:  Osteomyelitis, thoracic without only; Low back pain, symptoms persist with > 6 wks treatment bilat LE weakness recent tx for vertebral OM without only. EXAM: MRI THORACIC AND LUMBAR SPINE WITHOUT CONTRAST TECHNIQUE: Multiplanar and multiecho pulse sequences of the thoracic and lumbar spine were obtained without intravenous contrast. COMPARISON:  MRI thoracic spine 10/28/2023. MRI lumbar spine 10/29/2023. FINDINGS: MRI THORACIC SPINE FINDINGS Alignment:  Normal. Vertebrae: Unchanged postoperative appearance from prior posterior spinal fusion extending from T10 caudally into the lumbar spine. Decreased fluid signal in the T9-10 disc space with slightly increased edema in the surrounding endplates, suggestive of possible improvement in discitis with residual osteomyelitis. Acquired fusion at T8-9. Multilevel degenerative  endplate changes. Cord:  Normal spinal cord signal and volume. Paraspinal and other soft tissues: Unremarkable. Disc levels: Small disc bulges at multiple levels without significant mass effect on the spinal cord or spinal canal stenosis. At T9-10, there is a moderate disc bulge with superimposed central disc extrusion resulting in severe spinal canal stenosis with spinal cord compression, unchanged from prior. Bilateral facet arthropathy contributes to severe bilateral neural foraminal narrowing at T9-10. MRI LUMBAR SPINE FINDINGS Segmentation:  Standard. Alignment:  Unchanged mild grade 1 anterolisthesis of L4 on L5. Vertebrae: Unchanged postoperative appearance of prior posterior spinal fusion extending from the thoracic spine through L5. Prior anterior lumbar interbody fusion at L5-S1. Acquired fusion across the L1-2 disc space. Otherwise normal vertebral body heights with without suspicious marrow lesions or  evidence of discitis-osteomyelitis. Conus medullaris and cauda equina: Conus extends to the L1-2 level. Conus and cauda equina appear normal. Paraspinal and other soft tissues: No acute findings. Disc levels: Small residual disc bulge at L2-3 without significant spinal canal stenosis or high-grade neural foraminal narrowing at any of the operative levels. IMPRESSION: 1. Decreased fluid signal in the T9-10 disc space with slightly increased edema in the surrounding endplates, suggestive of possible improvement in discitis with residual osteomyelitis. 2. Unchanged severe spinal canal stenosis with spinal cord compression and severe bilateral neural foraminal narrowing at T9-10. 3. Unchanged postoperative appearance of prior posterior spinal fusion extending from the thoracic spine through L5. Prior anterior lumbar interbody fusion at L5-S1. No significant spinal canal stenosis or high-grade neural foraminal narrowing at any of the operative levels. Electronically Signed   By: Orvan [place] M.D.   On:  11/26/2023 17:08   MR LUMBAR SPINE WO CONTRAST Result Date: 11/26/2023 CLINICAL DATA:  Osteomyelitis, thoracic without only; Low back pain, symptoms persist with > 6 wks treatment bilat LE weakness recent tx for vertebral OM without only. EXAM: MRI THORACIC AND LUMBAR SPINE WITHOUT CONTRAST TECHNIQUE: Multiplanar and multiecho pulse sequences of the thoracic and lumbar spine were obtained without intravenous contrast. COMPARISON:  MRI thoracic spine 10/28/2023. MRI lumbar spine 10/29/2023. FINDINGS: MRI THORACIC SPINE FINDINGS Alignment:  Normal. Vertebrae: Unchanged postoperative appearance from prior posterior spinal fusion extending from T10 caudally into the lumbar spine. Decreased fluid signal in the T9-10 disc space with slightly increased edema in the surrounding endplates, suggestive of possible improvement in discitis with residual osteomyelitis. Acquired fusion at T8-9. Multilevel degenerative endplate changes. Cord:  Normal spinal cord signal and volume. Paraspinal and other soft tissues: Unremarkable. Disc levels: Small disc bulges at multiple levels without significant mass effect on the spinal cord or spinal canal stenosis. At T9-10, there is a moderate disc bulge with superimposed central disc extrusion resulting in severe spinal canal stenosis with spinal cord compression, unchanged from prior. Bilateral facet arthropathy contributes to severe bilateral neural foraminal narrowing at T9-10. MRI LUMBAR SPINE FINDINGS Segmentation:  Standard. Alignment:  Unchanged mild grade 1 anterolisthesis of L4 on L5. Vertebrae: Unchanged postoperative appearance of prior posterior spinal fusion extending from the thoracic spine through L5. Prior anterior lumbar interbody fusion at L5-S1. Acquired fusion across the L1-2 disc space. Otherwise normal vertebral body heights with without suspicious marrow lesions or evidence of discitis-osteomyelitis. Conus medullaris and cauda equina: Conus extends to the L1-2  level. Conus and cauda equina appear normal. Paraspinal and other soft tissues: No acute findings. Disc levels: Small residual disc bulge at L2-3 without significant spinal canal stenosis or high-grade neural foraminal narrowing at any of the operative levels. IMPRESSION: 1. Decreased fluid signal in the T9-10 disc space with slightly increased edema in the surrounding endplates, suggestive of possible improvement in discitis with residual osteomyelitis. 2. Unchanged severe spinal canal stenosis with spinal cord compression and severe bilateral neural foraminal narrowing at T9-10. 3. Unchanged postoperative appearance of prior posterior spinal fusion extending from the thoracic spine through L5. Prior anterior lumbar interbody fusion at L5-S1. No significant spinal canal stenosis or high-grade neural foraminal narrowing at any of the operative levels. Electronically Signed   By: Orvan [place] M.D.   On: 11/26/2023 17:08   VAS [place] LOWER EXTREMITY VENOUS (DVT) Result Date: 11/19/2023  Lower Venous DVT Study Patient Name:  ANGELYSE HESLIN  Date of Exam:   11/19/2023 Medical Rec #: [id number]  Accession #:    [id number] Date of Birth: [birth year]       Patient Gender: F Patient Age:   64 years Exam [facility]  [facility] Procedure:      VAS [place] LOWER EXTREMITY VENOUS (DVT) Referring Phys: DAN FLOYD --------------------------------------------------------------------------------  Indications: Swelling, and Edema.  Risk Factors: Surgery Surgery on right foot Trauma Recent fall past pregnancy and obesity. Comparison Study: No significant changes seen since the previous exam 03/26/19 Performing Technologist: Shona Simpson  Examination Guidelines: A complete evaluation includes B-mode imaging, spectral Doppler, color Doppler, and power Doppler as needed of all accessible portions of each vessel. Bilateral testing is considered an integral part of a complete examination. Limited examinations for reoccurring  indications may be performed as noted. The reflux portion of the exam is performed with the patient in reverse Trendelenburg.  +---------+---------------+---------+-----------+----------+--------------+ RIGHT    CompressibilityPhasicitySpontaneityPropertiesThrombus Aging +---------+---------------+---------+-----------+----------+--------------+ CFV      Full           Yes      Yes                                 +---------+---------------+---------+-----------+----------+--------------+ SFJ      Full                                                        +---------+---------------+---------+-----------+----------+--------------+ FV Prox  Full                                                        +---------+---------------+---------+-----------+----------+--------------+ FV Mid   Full                                                        +---------+---------------+---------+-----------+----------+--------------+ FV DistalFull                                                        +---------+---------------+---------+-----------+----------+--------------+ PFV      Full                                                        +---------+---------------+---------+-----------+----------+--------------+ POP      Full           Yes      Yes                                 +---------+---------------+---------+-----------+----------+--------------+ PTV      Full                                                        +---------+---------------+---------+-----------+----------+--------------+  PERO     Full                                                        +---------+---------------+---------+-----------+----------+--------------+   +----+---------------+---------+-----------+----------+--------------+ LEFTCompressibilityPhasicitySpontaneityPropertiesThrombus Aging +----+---------------+---------+-----------+----------+--------------+ CFV Full            Yes      Yes                                 +----+---------------+---------+-----------+----------+--------------+     Summary: RIGHT: - There is no evidence of deep vein thrombosis in the lower extremity.  - No cystic structure found in the popliteal fossa.  LEFT: - No evidence of common femoral vein obstruction.   *See table(s) above for measurements and observations. Electronically signed by Gerarda Fraction on 11/19/2023 at 4:12:50 PM.    Final    DG Ankle Complete Right Result Date: 11/19/2023 CLINICAL DATA:  Right ankle swelling. History of prior surgery approximately 5 years ago. EXAM: RIGHT ANKLE - COMPLETE 3+ VIEW COMPARISON:  None available this time. FINDINGS: Status post ORIF of the distal fibula with lateral plate and screw fixation construct and syndesmotic stabilization. Hardware appears intact with appropriate alignment. No evidence of significant periprosthetic lucency. No acute fracture or dislocation. Ankle mortise appears congruent. Soft tissue swelling of the ankle, most pronounced anterolaterally. IMPRESSION: Intact ORIF of the distal fibula and syndesmotic stabilization with appropriate alignment. No acute osseous abnormality. Electronically Signed   By: Hart Robinsons M.D.   On: 11/19/2023 12:21   [place] EKG SITE RITE Result Date: 10/31/2023 If Site Rite image not attached, placement could not be confirmed due to current cardiac rhythm.  IR THORACIC DISC ASPIRATION W/IMG GUIDE Result Date: 10/29/2023 INDICATION: T9-10 discitis/osteomyelitis. EXAM: FLUOROSCOPY GUIDED T9-10 INTERVERTEBRAL DISC FINE-NEEDLE ASPIRATION MEDICATIONS: No antibiotics utilized. ANESTHESIA/SEDATION: Moderate (conscious) sedation was employed during this procedure. A total of Versed 2 mg and Fentanyl 100 mcg was administered intravenously by the radiology nurse. Total intra-service moderate Sedation Time: 22 minutes. The patient's level of consciousness and vital signs were monitored continuously by  radiology nursing throughout the procedure under my direct supervision. COMPLICATIONS: None immediate. PROCEDURE: Informed written consent was obtained from the patient after a thorough discussion of the procedural risks, benefits and alternatives. All questions were addressed. Maximal Sterile Barrier Technique was utilized including caps, mask, sterile gowns, sterile gloves, sterile drape, hand hygiene and skin antiseptic. A timeout was performed prior to the initiation of the procedure. The patient was placed in prone position on the angiography table. The thoracic spine region was prepped and draped in a sterile fashion. Under fluoroscopy, the T9-10 disc space was delineated and the skin area was marked. The skin was infiltrated with a 1% Lidocaine approximately 6 cm lateral to the spinous process projection on the left. Using a 22-gauge spinal needle, the deep soft issue were infiltrated with Bupivacaine 0.5%. Subsequently, a 17 gauge needle was advanced under fluoroscopy guided into the T9-10 disc space. The mandrel was removed and fine-needle aspiration pass performed with no yield. Then, approximately 2 mL of sterile saline were injected into the disc space and aspirated back. Pain in case colored fluid was obtained. Samples were obtained. The needle was subsequently withdrawn. The access sites were cleaned and covered with a  sterile bandage. IMPRESSION: T9-10 intervertebral disc aspiration performed. Samples obtained were sent for Gram stain and culture. Electronically Signed   By: Baldemar Lenis M.D.   On: 10/29/2023 16:47    Microbiology: Results for orders placed or performed during the hospital encounter of 10/28/23  Blood culture (routine x 2)     Status: None   Collection Time: 10/28/23  8:12 PM   Specimen: BLOOD  Result Value Ref Range Status   Specimen Description BLOOD LEFT ANTECUBITAL  Final   Special Requests   Final    BOTTLES DRAWN AEROBIC ONLY Blood Culture adequate  volume   Culture   Final    NO GROWTH 5 DAYS Performed at [facility], [address], [place], [name] [address]    Report Status 11/02/2023 FINAL  Final  Blood culture (routine x 2)     Status: None   Collection Time: 10/28/23  8:24 PM   Specimen: BLOOD  Result Value Ref Range Status   Specimen Description BLOOD RIGHT ANTECUBITAL  Final   Special Requests   Final    BOTTLES DRAWN AEROBIC AND ANAEROBIC Blood Culture adequate volume   Culture   Final    NO GROWTH 5 DAYS Performed at [facility], [address] [place], [name] [address]    Report Status 11/02/2023 FINAL  Final  Aerobic/Anaerobic Culture w Gram Stain (surgical/deep wound)     Status: None   Collection Time: 10/29/23  3:45 PM   Specimen: Fine Needle Aspirate  Result Value Ref Range Status   Specimen Description NEEDLE ASPIRATE  Final   Special Requests INTERVERTEBRAL DISC  Final   Gram Stain NO WBC SEEN NO ORGANISMS SEEN   Final   Culture   Final    No growth aerobically or anaerobically. Performed at [facility], [address], [place], [name] [address]    Report Status 11/03/2023 FINAL  Final  Aerobic/Anaerobic Culture w Gram Stain (surgical/deep wound)     Status: None   Collection Time: 10/29/23  3:45 PM   Specimen: NEEDLE ASPIRATE; Abscess  Result Value Ref Range Status   Specimen Description NEEDLE ASPIRATE  Final   Special Requests INTERVERTEBRAL DISC  Final   Gram Stain NO WBC SEEN NO ORGANISMS SEEN   Final   Culture   Final    No growth aerobically or anaerobically. Performed at [facility], [address] [place], [name] [address]    Report Status 11/03/2023 FINAL  Final    Labs: CBC: Recent Labs  Lab 11/26/23 1525 11/27/23 0523 11/28/23 0350  WBC 8.2 7.1 6.9  NEUTROABS 4.8  --   --   HGB 13.0 11.2* 10.7*  HCT 42.8 36.8 35.4*  MCV 84.8 85.0 85.7  PLT 290 260 243   Basic Metabolic Panel: Recent Labs  Lab 11/26/23 1525 11/27/23 0523  11/28/23 0350  NA 142 144 143  K 3.0* 4.3 3.9  CL 97* 104 104  CO2 30 31 30   GLUCOSE 80 110* 109*  BUN 12 13 14   CREATININE 0.71 0.71 0.65  CALCIUM 9.5 9.3 9.0  MG  --   --  1.9   Liver Function Tests: Recent Labs  Lab 11/26/23 1525  AST 28  ALT 24  ALKPHOS 112  BILITOT 0.3  PROT 6.3*  ALBUMIN 3.5   CBG: Recent Labs  Lab 11/27/23 2121 11/28/23 0036 11/28/23 0623  GLUCAP 142* 112* 111*    Discharge time spent:  less than 30 minutes.  Signed: Londynn Artis, DO [facility] 11/28/2023

## 2023-11-28 NOTE — Evaluation (Signed)
Occupational Therapy Evaluation Patient Details Name: [name] MRN: [id number] DOB: Aug 11, 1961 Today's Date: 11/28/2023   History of Present Illness   63yo F who presented to the ED on 11/27/23  due to PICC line clog, reports of multiple recent falls. Admitted for w/u.PMH active discitis on IV cefepime at home, obesity, multiple spinal surgeries, scoliosis, B TKRs, hx of osteomyelitis, anxiety, fibro, HTN, shoulder surgery     Clinical Impressions Prior to this admission, patient living alone, and endorses some falls, however per chart and family has been falling every day and EMS having to assist. Patient uses crutches or RW for mobility. Currently, patient tangential, with very poor insight into deficits, and often not answering the question asked from OT. Patient requiring mod A for bed mobility, total A for peri-care as patient was on the bed pan, and mod A for ADLs. Patient able to come into standing with heavy mod A and elevated surface, and able to take 3 steps towards [facility] with significant time and effort. Per this OT's recommendation, patient is unsafe to go home unless she has 24/7 support. If she does discharge home, she is at risk of falling and will likely be calling EMS for assistance. OT recommending rehab at a lesser intensive facility < 3 hours prior to discharge. OT will continue to follow acutely.      If plan is discharge home, recommend the following:   Two people to help with walking and/or transfers;A lot of help with bathing/dressing/bathroom;Assistance with cooking/housework;Assist for transportation;Help with stairs or ramp for entrance     Functional Status Assessment   Patient has had a recent decline in their functional status and demonstrates the ability to make significant improvements in function in a reasonable and predictable amount of time.     Equipment Recommendations   Other (comment) (defer to next venue)     Recommendations for Other  Services         Precautions/Restrictions   Precautions Precautions: Fall;Other (comment) Precaution/Restrictions Comments: discitis, high fall risk, high pain levels due to fibro Restrictions Weight Bearing Restrictions Per Provider Order: No     Mobility Bed Mobility Overal bed mobility: Needs Assistance Bed Mobility: Rolling, Sidelying to Sit, Sit to Sidelying Rolling: Mod assist, Used rails Sidelying to sit: Mod assist, HOB elevated, Used rails     Sit to sidelying: Mod assist, HOB elevated, Used rails General bed mobility comments: heavy mod A to complete rolling and to sit EOB, heavy use of bed rails and HOB elevated, unable to bring BLEs back into bed when returning to supine    Transfers Overall transfer level: Needs assistance Equipment used: Rolling walker (2 wheels) Transfers: Sit to/from Stand Sit to Stand: Mod assist, From elevated surface           General transfer comment: heavy mod A to come into standing, significant effort to come up to [facility] taking 3 steps in 5 minutes to longer      Balance Overall balance assessment: History of Falls, Needs assistance Sitting-balance support: Bilateral upper extremity supported, Feet supported Sitting balance-[name]: Fair   Postural control: Posterior lean Standing balance support: Bilateral upper extremity supported, During functional activity, Reliant on assistive device for balance Standing balance-[name]: Poor                             ADL either performed or assessed with clinical judgement   ADL Overall ADL's : Needs  assistance/impaired Eating/Feeding: Set up;Sitting   Grooming: Set up;Sitting   Upper Body Bathing: Contact guard assist;Sitting   Lower Body Bathing: Maximal assistance;Total assistance;Sitting/lateral leans;Sit to/from stand   Upper Body Dressing : Contact guard assist;Sitting   Lower Body Dressing: Maximal assistance;Total assistance;Sitting/lateral  leans;Sit to/from stand   Toilet Transfer: Moderate assistance;Stand-pivot;Rolling walker (2 wheels);BSC/3in1 Toilet Transfer Details (indicate cue type and reason): simulated with incremental steps to [facility] Toileting- Clothing Manipulation and Hygiene: Total assistance;Bed level Toileting - Clothing Manipulation Details (indicate cue type and reason): total care after BM in bed pan     Functional mobility during ADLs: Moderate assistance;+2 for physical assistance;+2 for safety/equipment;Rolling walker (2 wheels);Cueing for sequencing General ADL Comments: Prior to this admission, patient living alone, and endorses some falls, however per chart and family has been falling every day and EMS having to assist. Patient uses crutches or RW for mobility. Currently, patient tangential, with very poor insight into deficits, and often not answering the question asked from OT. Patient requiring mod A for bed mobility, total A for peri-care as patient was on the bed pan, and mod A for ADLs. Patient able to come into standing with heavy mod A and elevated surface, and able to take 3 steps towards [facility] with significant time and effort. Per this OT's recommendation, patient is unsafe to go home unless she has 24/7 support. If she does discharge home, she is at risk of falling and will likely be calling EMS for assistance. OT recommending rehab at a lesser intensive facility < 3 hours prior to discharge. OT will continue to follow acutely.     Vision Baseline Vision/History: 1 Wears glasses Ability to See in Adequate Light: 0 Adequate Patient Visual Report: No change from baseline Vision Assessment?: No apparent visual deficits     Perception Perception: Not tested       Praxis Praxis: Not tested       Pertinent Vitals/Pain Pain Assessment Pain Assessment: Faces Faces Pain Scale: Hurts a little bit Pain Location: R hip with bed mobility Pain Descriptors / Indicators: Discomfort Pain Intervention(s):  Limited activity within patient's tolerance, Monitored during session, Repositioned     Extremity/Trunk Assessment Upper Extremity Assessment Upper Extremity Assessment: Generalized weakness   Lower Extremity Assessment Lower Extremity Assessment: Defer to PT evaluation   Cervical / Trunk Assessment Cervical / Trunk Assessment: Kyphotic   Communication Communication Communication: No apparent difficulties   Cognition Arousal: Alert Behavior During Therapy: WFL for tasks assessed/performed Cognition: Cognition impaired     Awareness: Intellectual awareness intact, Online awareness impaired Memory impairment (select all impairments): Short-term memory Attention impairment (select first level of impairment): Selective attention Executive functioning impairment (select all impairments): Reasoning, Problem solving OT - Cognition Comments: Decreased understanding of the severity of her deficits, unable to state what would happen if she fell and wasnt near her phone, extremely tangential with all line of questioning                 Following commands: Intact       Cueing  General Comments   Cueing Techniques: Verbal cues;Gestural cues      Exercises     Shoulder Instructions      Home Living Family/patient expects to be discharged to:: Private residence Living Arrangements: Alone Available Help at Discharge: Family;Friend(s);Available PRN/intermittently Type of Home: House Home Access: Stairs to enter [employer] of Steps: 5 Entrance Stairs-Rails: Right;Left;Can reach both Home Layout: One level     Bathroom Shower/Tub: Tub/shower unit  Bathroom Toilet: Standard     Home Equipment: Shower seat;Hand held Programmer, systems (2 wheels);Rollator (4 wheels);Grab bars - tub/shower;Cane - single point;Adaptive equipment;BSC/3in1;Crutches Adaptive Equipment: Reacher;Long-handled sponge Additional Comments: daily falls needing EMS to help her get  back up      Prior Functioning/Environment Prior Level of Function : History of Falls (last six months);Driving;Needs assist             Mobility Comments: Mod I with rollator, cane or crutches; but endorses frequent falls ADLs Comments: gets groceries delievered, did not give a clear answer on ADL management despite OT asking    OT Problem List: Decreased strength;Decreased range of motion;Decreased activity tolerance;Impaired balance (sitting and/or standing);Decreased cognition;Decreased coordination;Decreased safety awareness;Decreased knowledge of use of DME or AE;Decreased knowledge of precautions;Pain;Increased edema;Obesity   OT Treatment/Interventions: Self-care/ADL training;Therapeutic exercise;Energy conservation;DME and/or AE instruction;Manual therapy;Modalities;Therapeutic activities;Cognitive remediation/compensation;Patient/family education;Balance training      OT Goals(Current goals can be found in the care plan section)   Acute Rehab OT Goals Patient Stated Goal: to go home OT Goal Formulation: With patient Time For Goal Achievement: 12/12/23 Potential to Achieve Goals: Fair   OT Frequency:  Min 1X/week    Co-evaluation              AM-PAC OT "6 Clicks" Daily Activity     Outcome Measure Help from another person eating meals?: A Little Help from another person taking care of personal grooming?: A Little Help from another person toileting, which includes using toliet, bedpan, or urinal?: Total Help from another person bathing (including washing, rinsing, drying)?: A Lot Help from another person to put on and taking off regular upper body clothing?: A Little Help from another person to put on and taking off regular lower body clothing?: A Lot 6 Click Score: 14   End of Session Equipment Utilized During Treatment: Gait belt;Rolling walker (2 wheels) Nurse Communication: Mobility status  Activity Tolerance: Patient limited by fatigue Patient left:  in bed;with call bell/phone within reach;with bed alarm set  OT Visit Diagnosis: Unsteadiness on feet (R26.81);Other abnormalities of gait and mobility (R26.89);Repeated falls (R29.6);Muscle weakness (generalized) (M62.81);History of falling (Z91.81);Other symptoms and signs involving cognitive function;Pain Pain - Right/Left: Right Pain - part of body: Hip                Time: 1610-9604 OT Time Calculation (min): 35 min Charges:  OT General Charges $OT Visit: 1 Visit OT Evaluation $OT Eval Moderate Complexity: 1 Mod OT Treatments $Self Care/Home Management : 8-22 mins  Pollyann Glen E. Sofia Vanmeter, OTR/L Acute Rehabilitation Services [phone]   Cherlyn Cushing 11/28/2023, 1:33 PM

## 2023-11-28 NOTE — TOC Progression Note (Signed)
Transition of Care [facility]) - Progression Note    Patient Details  Name: [name] MRN: [id number] Date of Birth: 11-18-1960  Transition of Care [facility] CM/SW Contact  Gordy Clement, RN Phone Number: 11/28/2023, 1:07 PM  Clinical Narrative:    RNCM received Secure Chat that patient wants to DC to home and not SNF as was recommended.  RNCM met patient bedside to discuss.  Patient states that she lives alone. Her Son is in [place] and cannot be here at this time to assist patient- She requires at least one person to assist with ambulation and ADL's. She has had several falls prior to admision. PT was working with Patient again today and still recommends SNF for rehab. Patient wanted to call Family members to see if someone can assist . RNCM stepped out of room for a bit to give patient time to make calls. Provider and SW made aware.  RNCM will return to shortly patient       Barriers to Discharge: Continued Medical Work up  Expected Discharge Plan and Services In-house Referral: Clinical Social Work   Post Acute Care Choice: Skilled Nursing Facility   Expected Discharge Date: 11/28/23                                     Social Determinants of Health (SDOH) Interventions SDOH Screenings   Food Insecurity: No Food Insecurity (11/27/2023)  Housing: Low Risk  (11/27/2023)  Transportation Needs: No Transportation Needs (11/27/2023)  Utilities: Not At Risk (11/27/2023)  Depression (PHQ2-9): Low Risk  (01/16/2023)  Tobacco Use: Medium Risk (10/29/2023)    Readmission Risk Interventions    05/17/2022    3:02 PM  Readmission Risk Prevention Plan  Transportation Screening Complete  PCP or Specialist Appt within 5-7 Days Complete  Home Care Screening Complete  Medication Review (RN CM) Complete

## 2023-11-28 NOTE — NC FL2 (Signed)
[place] MEDICAID FL2 LEVEL OF CARE FORM     IDENTIFICATION  Patient Name: [name] Birthdate: August 20, 1961 Sex: female Admission Date (Current Location): 11/26/2023  [facility] and [place] Number:  Producer, television/film/video and Address:  [facility], [address], [place], [name] [address]      Provider Number: [id number]  Attending Physician Name and Address:  Loghan Artis, DO  Relative Name and Phone Number:  Lamisha Roussell; Son; [phone]    Current Level of Care: Hospital Recommended Level of Care: Skilled Nursing Facility Prior Approval Number:    Date Approved/Denied:   PASRR Number: [id number]  Discharge Plan: SNF    Current Diagnoses: Patient Active Problem List   Diagnosis Date Noted   Falls frequently 11/27/2023   Problem with vascular access 11/26/2023   Diskitis 10/28/2023   Wound infection after surgery 05/14/2022   Delayed surgical wound healing 05/12/2022   Postoperative wound infection 04/08/2022   Scoliosis due to degenerative disease of spine in adult patient 03/30/2022   HNP (herniated nucleus pulposus), cervical 12/08/2020   Lumbago with sciatica, unspecified side 07/28/2020   PICC (peripherally inserted central catheter) in place 03/16/2020   Encounter for long-term (current) use of antibiotics 03/16/2020   Polypharmacy 03/09/2020   At risk for adverse drug event 03/09/2020   MSSA bacteremia 02/29/2020   Postoperative infection 02/28/2020   Abnormal gait due to muscle weakness 02/26/2020   HNP (herniated nucleus pulposus), lumbar 02/16/2020   Muscle weakness of lower extremity 02/14/2020   Spondylolisthesis of lumbar region 10/30/2017   Painful orthopaedic hardware right ankle 05/27/2016   Closed fracture of distal end of right fibula and tibia    Ankle syndesmosis disruption    ASCUS favor benign 01/06/2014   Leg swelling 12/22/2013   Seasonal allergies    Anemia    Chronic back pain due to DJD with  history surgery    Hyperthyroidism    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 03/24/2012   Status post left total prosthetic replacement of knee joint using cement 03/24/2012   Fibromyalgia    GERD (gastroesophageal reflux disease)    Anxiety    Depression    Hypertension    Lumbar degenerative disc disease 09/25/2011    Orientation RESPIRATION BLADDER Height & Weight     Self, Time, Situation, Place  Normal (Room Air) Continent Weight:   Height:     BEHAVIORAL SYMPTOMS/MOOD NEUROLOGICAL BOWEL NUTRITION STATUS      Continent Diet (Please see dc summary)  AMBULATORY STATUS COMMUNICATION OF NEEDS Skin   Extensive Assist Verbally Normal                       Personal Care Assistance Level of Assistance  Bathing, Feeding, Dressing Bathing Assistance: Maximum assistance Feeding assistance: Maximum assistance Dressing Assistance: Maximum assistance     Functional Limitations Info  Sight Sight Info: Impaired (R and L (Reading glasses))        SPECIAL CARE FACTORS FREQUENCY  PT (By licensed PT), OT (By licensed OT)     PT Frequency: 5x OT Frequency: 5x            Contractures Contractures Info: Not present    Additional Factors Info  Code Status, Allergies, Insulin Sliding Scale Code Status Info: Full Code Allergies Info: Monosodium Glutamate; Shellfish Allergy; Pet dander; Latex; Gadolinium; Diclofenac; Contrast Media (iodinated Contrast Media); Celebrex (celecoxib); Baclofen   Insulin Sliding Scale Info: Please  see dc summary       Current Medications (11/28/2023):  This is the current hospital active medication list Current Facility-Administered Medications  Medication Dose Route Frequency Provider Last Rate Last Admin   albuterol (PROVENTIL) (2.5 MG/3ML) 0.083% nebulizer solution 3 mL  3 mL Inhalation Q6H PRN Buena Irish, MD       aspirin EC tablet 81 mg  81 mg Oral Daily Buena Irish, MD   81 mg at 11/28/23 1610   bisacodyl  (DULCOLAX) EC tablet 5 mg  5 mg Oral Daily PRN Buena Irish, MD       ceFEPIme (MAXIPIME) 2 g in sodium chloride 0.9 % 100 mL IVPB  2 g Intravenous Q8H Buena Irish, MD 200 mL/hr at 11/28/23 1407 2 g at 11/28/23 1407   Chlorhexidine Gluconate Cloth 2 % PADS 6 each  6 each Topical Daily Dru Artis, DO   6 each at 11/28/23 9604   cholecalciferol (VITAMIN D3) 25 MCG (1000 UNIT) tablet 5,000 Units  5,000 Units Oral Daily Buena Irish, MD   5,000 Units at 11/28/23 5409   diazepam (VALIUM) tablet 5 mg  5 mg Oral QHS Buena Irish, MD   5 mg at 11/27/23 2148   diazepam (VALIUM) tablet 5 mg  5 mg Oral Daily PRN Buena Irish, MD       enoxaparin (LOVENOX) injection 40 mg  40 mg Subcutaneous Q24H Buena Irish, MD   40 mg at 11/27/23 2148   gabapentin (NEURONTIN) capsule 1,200 mg  1,200 mg Oral TID Buena Irish, MD   1,200 mg at 11/28/23 8119   hydrOXYzine (ATARAX) tablet 50 mg  50 mg Oral BID PRN Buena Irish, MD       ondansetron [facility]) tablet 4 mg  4 mg Oral Q6H PRN Buena Irish, MD       Or   ondansetron [facility]) injection 4 mg  4 mg Intravenous Q6H PRN Buena Irish, MD       pantoprazole (PROTONIX) EC tablet 40 mg  40 mg Oral Daily Buena Irish, MD   40 mg at 11/28/23 1478   sodium chloride flush (NS) 0.9 % injection 10-40 mL  10-40 mL Intracatheter Q12H Toni Amend W, DO   10 mL at 11/28/23 2956   sodium chloride flush (NS) 0.9 % injection 10-40 mL  10-40 mL Intracatheter PRN Danessa Artis, DO         Discharge Medications: Please see discharge summary for a list of discharge medications.  Relevant Imaging Results:  Relevant Lab Results:   Additional Information SSN: [id number]  Marliss Coots, LCSW

## 2023-11-29 DIAGNOSIS — Z789 Other specified health status: Secondary | ICD-10-CM | POA: Diagnosis not present

## 2023-11-29 MED ORDER — HYDRALAZINE HCL 20 MG/ML IJ SOLN: 5.0000 mg | INTRAMUSCULAR | Status: DC | PRN

## 2023-11-29 MED ORDER — DIPHENHYDRAMINE HCL 25 MG PO CAPS
25.0000 mg | ORAL_CAPSULE | Freq: Four times a day (QID) | ORAL | Status: DC | PRN
  Administered 2023-11-29 – 2023-11-30 (×3): 25 mg via ORAL
  Filled 2023-11-29 (×3): qty 1

## 2023-11-29 MED ORDER — CHLORTHALIDONE 25 MG PO TABS
25.0000 mg | ORAL_TABLET | Freq: Every day | ORAL | Status: DC
  Administered 2023-11-29 – 2023-12-04 (×6): 25 mg via ORAL
  Filled 2023-11-29 (×6): qty 1

## 2023-11-29 NOTE — Plan of Care (Signed)
 Patient alert/oriented X4. Patient compliant with medication administration and was able to stand with assist. Patient turned Q2 hours and VSS. Benadryl administered as needed for allergy symptoms.   Problem: Education: Goal: Knowledge of General Education information will improve Description: Including pain rating scale, medication(s)/side effects and non-pharmacologic comfort measures Outcome: Progressing   Problem: Health Behavior/Discharge Planning: Goal: Ability to manage health-related needs will improve Outcome: Progressing   Problem: Clinical Measurements: Goal: Ability to maintain clinical measurements within normal limits will improve Outcome: Progressing   Problem: Clinical Measurements: Goal: Will remain free from infection Outcome: Progressing   Problem: Clinical Measurements: Goal: Diagnostic test results will improve Outcome: Progressing   Problem: Clinical Measurements: Goal: Respiratory complications will improve Outcome: Progressing   Problem: Clinical Measurements: Goal: Cardiovascular complication will be avoided Outcome: Progressing   Problem: Activity: Goal: Risk for activity intolerance will decrease Outcome: Progressing   Problem: Nutrition: Goal: Adequate nutrition will be maintained Outcome: Progressing   Problem: Coping: Goal: Level of anxiety will decrease Outcome: Progressing   Problem: Elimination: Goal: Will not experience complications related to bowel motility Outcome: Progressing   Problem: Elimination: Goal: Will not experience complications related to urinary retention Outcome: Progressing   Problem: Pain Managment: Goal: General experience of comfort will improve and/or be controlled Outcome: Progressing   Problem: Safety: Goal: Ability to remain free from injury will improve Outcome: Progressing   Problem: Skin Integrity: Goal: Risk for impaired skin integrity will decrease Outcome: Progressing

## 2023-11-29 NOTE — Plan of Care (Signed)

## 2023-11-29 NOTE — Progress Notes (Signed)
 Progress Note   Patient: [name] WJX:[id number] DOB: 1961/09/07 DOA: 11/26/2023     1 DOS: the patient was seen and examined on 11/29/2023   Brief hospital course:   [name] is a 63 y.o. female with medical history significant for active discitis on IV cefepime at home, obesity, multiple spinal surgeries, scoliosis, bilateral knee replacements, and history of distant osteomyelitis who comes in today because her PICC line is clogged and is having intermittent falls at home.  Assessment and Plan:  Discitis/osteomyelitis (POA) - Was receiving cefepime through PICC and home setting however PICC line did become clogged.  Attempts to unclog in the ER were unsuccessful.  PICC line subsequently replaced while inpatient.  Continue IV cefepime while inpatient as well as on discharge per previous regimen.  Recheck of patient's MRI notes improvement in her discitis.  Noted spinal canal stenosis similar to previous imaging.  Per previous neurosurgery notes from prior hospitalization, had recommended no surgical intervention until infection had cleared.   Physical debilitation muscle weakness with increased falls at home - Patient stating she has had increased falls, states her knees are giving out.  No overt change in her MRI compared to previous hospitalization.  Patient evaluated by PT/OT.  Patient herself initially denying wanting to visit a skilled nursing facility.  However had a lengthy discussion with the patient, patient's family, case managers and decided to come to consensus that patient does indeed need skilled rehab.  As such previous discharge home with home health was discontinued.  Working closely with case management on disposition planning to short-term rehab.  Antibiotic induced diarrhea - Appears to be mild.  No fever or leukocytosis to suggest C. difficile.  Possible hyperthyroidism - TSH 0.1.  No note of patient taking levothyroxine.  Free T4 normal.  Patient herself says  that she is followed with her thyroid function in the outpatient setting and her TSH is always low.  Likely subclinical hyperthyroid.   Hypertension - Not well-controlled, intermittently elevated.  Will add chlorthalidone 35 mg daily.  Monitor response.      Subjective: Patient resting comfortably in bed this morning.   Denies any fever, chills, chest pain, nausea, vomiting, abdominal pain.  Reiterated our long conversation and consensus from yesterday.  Patient again agreeable to hang out and wait for short-term rehab.  Hopeful that she will have a better experience and get stronger.  Physical Exam: Vitals:   11/28/23 1608 11/28/23 2052 11/29/23 0524 11/29/23 0751  BP: (!) 174/92 131/71 (!) 177/91 (!) 172/87  Pulse: 95 (!) 110 81 81  Resp: 16 20 18 16   Temp: 98.9 F (37.2 C) 98.7 F (37.1 C) 97.9 F (36.6 C) 98.1 F (36.7 C)  TempSrc:  Oral Oral Oral  SpO2: 97% 97% 96% 97%   GENERAL:  Alert, pleasant, no acute distress  HEENT:  EOMI CARDIOVASCULAR:  RRR, no murmurs appreciated RESPIRATORY:  Clear to auscultation, no wheezing, rales, or rhonchi GASTROINTESTINAL:  Soft, nontender, nondistended EXTREMITIES:  No LE edema bilaterally NEURO:  No new focal deficits appreciated SKIN:  No rashes noted PSYCH:  Appropriate mood and affect   Data Reviewed:  There are no new results to review at this time.  Family Communication: None at bedside  Disposition: Status is: Observation The patient remains OBS appropriate and will d/c before 2 midnights.  Planned Discharge Destination: Short-term rehab    Time spent: 30 minutes  Author: Destina Artis, DO 11/29/2023 1:25 PM  For on call review  [email or web address]

## 2023-11-29 NOTE — Progress Notes (Signed)
 Physical Therapy Treatment Patient Details Name: [name] MRN: [id number] DOB: 1961/08/04 Today's Date: 11/29/2023   History of Present Illness 63yo F who presented to the ED on 11/27/23  due to PICC line clog, reports of multiple recent falls. Admitted for w/u.PMH active discitis on IV cefepime at home, obesity, multiple spinal surgeries, scoliosis, B TKRs, hx of osteomyelitis, anxiety, fibro, HTN, shoulder surgery    PT Comments  Pt willing to attempt ambulation today. Increased time and mod A needed to come to EOB. Mod A +2 needed to stand to RW. Pregait activities performed in standing and plan was for pt to walk to chair. However, she is unable to pick up her R foot and when she moves it it rolls into inversion and with her sensory deficits she does not feel it. Therapist has to manually move and place R foot for each small step and pt could not tolerate this for more than 3 steps. Pt with increased fatigue and pain after standing and needed max A +2 to return to bed. Pt reports that she has a boot and ankle brace for R foot at home. Instructed to have family bring this to SNF as she will need it to progress ambulation. Patient will benefit from continued inpatient follow up therapy, <3 hours/day. PT will continue to follow.     If plan is discharge home, recommend the following: Two people to help with walking and/or transfers;Assistance with cooking/housework;A lot of help with bathing/dressing/bathroom;Assist for transportation;Help with stairs or ramp for entrance   Can travel by private vehicle     No  Equipment Recommendations  None recommended by PT    Recommendations for Other Services       Precautions / Restrictions Precautions Precautions: Fall;Other (comment) Recall of Precautions/Restrictions: Intact Precaution/Restrictions Comments: discitis, high fall risk, high pain levels due to fibromyalgia. Also, in standing pt with R ankle inversion and she does not feel it  due to sensory deficits Required Braces or Orthoses:  (needs brace for R ankle, she reports she has a boot and a brace at home) Restrictions Weight Bearing Restrictions Per Provider Order: No     Mobility  Bed Mobility Overal bed mobility: Needs Assistance Bed Mobility: Supine to Sit, Sit to Supine     Supine to sit: Mod assist, +2 for physical assistance Sit to supine: +2 for physical assistance, Max assist   General bed mobility comments: pt needed increased time to come to EOB and eventual assist at hips with pad. Pt with increased pain after standing and needed more assist to go back to bed. Max A +2 for lower body    Transfers Overall transfer level: Needs assistance Equipment used: Rolling walker (2 wheels) Transfers: Sit to/from Stand Sit to Stand: Mod assist, From elevated surface, +2 safety/equipment           General transfer comment: heavy mod A to come into standing. Plan was to pivot to chair but pt unable to step R foot without it rolling into inversion. Therapist manually assisted RLE with each step and pt could only take 3 small steps before needing to sit back down. Rec stedy for pivot to chair    Ambulation/Gait             Pre-gait activities: increased standing tolerance, wt shifting, facilitated stepping General Gait Details: unable  due to weakness, pain, and R ankle instability   Stairs             Wheelchair  Mobility     Tilt Bed    Modified Rankin (Stroke Patients Only)       Balance Overall balance assessment: History of Falls, Needs assistance Sitting-balance support: Bilateral upper extremity supported, Feet supported Sitting balance-[name]: Fair     Standing balance support: Bilateral upper extremity supported, During functional activity, Reliant on assistive device for balance Standing balance-[name]: Poor Standing balance comment: heavy reliance on RW                            Communication  Communication Communication: No apparent difficulties  Cognition Arousal: Alert Behavior During Therapy: WFL for tasks assessed/performed   PT - Cognitive impairments: No apparent impairments                         Following commands: Intact      Cueing Cueing Techniques: Verbal cues, Gestural cues  Exercises      General Comments General comments (skin integrity, edema, etc.): pt agreeable to SNF. Asked her to have her nephew bring her boot and R ankle brace to SNF when he brings her clothes      Pertinent Vitals/Pain Pain Assessment Pain Assessment: Faces Faces Pain Scale: Hurts even more Pain Location: back Pain Descriptors / Indicators: Discomfort Pain Intervention(s): Limited activity within patient's tolerance, Monitored during session    Home Living                          Prior Function            PT Goals (current goals can now be found in the care plan section) Acute Rehab PT Goals Patient Stated Goal: less pain/no falls PT Goal Formulation: With patient Time For Goal Achievement: 12/11/23 Potential to Achieve Goals: Fair Progress towards PT goals: Progressing toward goals    Frequency    Min 1X/week      PT Plan      Co-evaluation              AM-PAC PT "6 Clicks" Mobility   Outcome Measure  Help needed turning from your back to your side while in a flat bed without using bedrails?: A Lot Help needed moving from lying on your back to sitting on the side of a flat bed without using bedrails?: A Lot Help needed moving to and from a bed to a chair (including a wheelchair)?: Total Help needed standing up from a chair using your arms (e.g., wheelchair or bedside chair)?: Total Help needed to walk in hospital room?: Total Help needed climbing 3-5 steps with a railing? : Total 6 Click Score: 8    End of Session Equipment Utilized During Treatment: Gait belt Activity Tolerance: Patient limited by pain;Patient limited by  fatigue Patient left: with call bell/phone within reach;in bed;with bed alarm set Nurse Communication: Mobility status;Need for lift equipment (stedy) PT Visit Diagnosis: Unsteadiness on feet (R26.81);Muscle weakness (generalized) (M62.81);Repeated falls (R29.6);Difficulty in walking, not elsewhere classified (R26.2);Pain Pain - part of body:  (back)     Time: 9147-8295 PT Time Calculation (min) (ACUTE ONLY): 36 min  Charges:    $Therapeutic Activity: 23-37 mins PT General Charges $$ ACUTE PT VISIT: 1 Visit                     Lyanne Co, PT  Acute Rehab Services Secure chat preferred Office [phone]  [place] L Eliz Nigg 11/29/2023, 12:28 PM

## 2023-11-30 DIAGNOSIS — Z789 Other specified health status: Secondary | ICD-10-CM | POA: Diagnosis not present

## 2023-11-30 LAB — BASIC METABOLIC PANEL
Anion gap: 12 (ref 5–15)
BUN: 10 mg/dL (ref 8–23)
CO2: 30 mmol/L (ref 22–32)
Calcium: 9.6 mg/dL (ref 8.9–10.3)
Chloride: 99 mmol/L (ref 98–111)
Creatinine, Ser: 0.56 mg/dL (ref 0.44–1.00)
GFR, Estimated: >60 mL/min (ref >60)
Glucose, Bld: 101 mg/dL — ABNORMAL HIGH (ref 70–99)
Potassium: 3.8 mmol/L (ref 3.5–5.1)
Sodium: 141 mmol/L (ref 135–145)

## 2023-11-30 LAB — CBC
HCT: 36.3 % (ref 36.0–46.0)
Hemoglobin: 11.2 g/dL — ABNORMAL LOW (ref 12.0–15.0)
MCH: 26.2 pg (ref 26.0–34.0)
MCHC: 30.9 g/dL (ref 30.0–36.0)
MCV: 85 fL (ref 80.0–100.0)
Platelets: 252 10*3/uL (ref 150–400)
RBC: 4.27 MIL/uL (ref 3.87–5.11)
RDW: 12.8 % (ref 11.5–15.5)
WBC: 8.4 10*3/uL (ref 4.0–10.5)
nRBC: 0 % (ref 0.0–0.2)

## 2023-11-30 MED ORDER — AMLODIPINE BESYLATE 5 MG PO TABS
5.0000 mg | ORAL_TABLET | Freq: Every day | ORAL | Status: DC
  Administered 2023-11-30 – 2023-12-04 (×5): 5 mg via ORAL
  Filled 2023-11-30 (×5): qty 1

## 2023-11-30 MED ORDER — DIPHENHYDRAMINE HCL 25 MG PO CAPS
50.0000 mg | ORAL_CAPSULE | Freq: Four times a day (QID) | ORAL | Status: DC | PRN
  Administered 2023-11-30 – 2023-12-04 (×6): 50 mg via ORAL
  Filled 2023-11-30 (×6): qty 2

## 2023-11-30 MED ORDER — OXYCODONE HCL 5 MG PO TABS
5.0000 mg | ORAL_TABLET | Freq: Once | ORAL | Status: AC
  Administered 2023-12-01: 5 mg via ORAL
  Filled 2023-11-30: qty 1

## 2023-11-30 NOTE — Progress Notes (Signed)
  Progress Note   Patient: [name] [id number]:[id number] DOB: Aug 06, 1961 DOA: 11/26/2023     1 DOS: the patient was seen and examined on 11/30/2023   Brief hospital course:   [name] is a 63 y.o. female with medical history significant for active discitis on IV cefepime at home, obesity, multiple spinal surgeries, scoliosis, bilateral knee replacements, and history of distant osteomyelitis who comes in today because her PICC line is clogged and is having intermittent falls at home.  Assessment and Plan:  Discitis/osteomyelitis (POA) - Was receiving cefepime through PICC and home setting however PICC line did become clogged.  Attempts to unclog in the ER were unsuccessful.  PICC line subsequently replaced while inpatient.  Continue IV cefepime while inpatient as well as on discharge per previous regimen.  Recheck of patient's MRI notes improvement in her discitis.  Noted spinal canal stenosis similar to previous imaging.  Per previous neurosurgery notes from prior hospitalization, had recommended no surgical intervention until infection had cleared.   Physical debilitation muscle weakness with increased falls at home - Patient stating she has had increased falls, states her knees are giving out.  No overt change in her MRI compared to previous hospitalization.  Patient evaluated by PT/OT.  Patient herself initially denying wanting to visit a skilled nursing facility.  However had a lengthy discussion with the patient, patient's family, case managers and decided to come to consensus that patient does indeed need skilled rehab.  As such previous discharge home with home health was discontinued.  Working closely with case management on disposition planning to short-term rehab.  Antibiotic induced diarrhea - Appears to be mild.  No fever or leukocytosis to suggest C. difficile.  Possible hyperthyroidism - TSH 0.1.  No note of patient taking levothyroxine.  Free T4 normal.  Patient herself says  that she is followed with her thyroid function in the outpatient setting and her TSH is always low.  Likely subclinical hyperthyroid.   Hypertension - Still not well-controlled.  Yesterday added chlorthalidone 25 mg daily.  Will add amlodipine 5 mg daily today.  Monitor response.      Subjective: Patient resting comfortably in bed this morning.   Denies any fever, chills, chest pain, nausea, vomiting, abdominal pain.    Physical Exam: Vitals:   11/29/23 1558 11/29/23 2013 11/30/23 0546 11/30/23 0729  BP: (!) 159/84 (!) 171/85 (!) 156/81 (!) 157/95  Pulse: 89 92 95 95  Resp: 16 19 18 16   Temp: 99 F (37.2 C) 98.2 F (36.8 C) 98.5 F (36.9 C) (!) 97.5 F (36.4 C)  TempSrc: Oral Oral Oral Oral  SpO2: 100% 96% 94% 95%   GENERAL:  Alert, pleasant, no acute distress  HEENT:  EOMI CARDIOVASCULAR:  RRR, no murmurs appreciated RESPIRATORY:  Clear to auscultation, no wheezing, rales, or rhonchi GASTROINTESTINAL:  Soft, nontender, nondistended EXTREMITIES:  No LE edema bilaterally NEURO:  No new focal deficits appreciated SKIN:  No rashes noted PSYCH:  Appropriate mood and affect   Data Reviewed:  There are no new results to review at this time.  Family Communication: None at bedside  Disposition: Status is: Observation The patient remains OBS appropriate and will d/c before 2 midnights.  Planned Discharge Destination: Short-term rehab    Time spent: 31 minutes  Author: Chrissie Artis, DO 11/30/2023 2:25 PM  For on call review [email or web address].

## 2023-11-30 NOTE — TOC Progression Note (Signed)
 Transition of Care [facility]) - Progression Note    Patient Details  Name: CONTESSA PREUSS MRN: [id number] Date of Birth: 31-Dec-1960  Transition of Care [facility]) CM/SW Contact  Dellie Burns [place], [name] Phone Number: 11/30/2023, 10:09 AM  Clinical Narrative: Met with pt and provided current SNF offers. Pt requests to review facilities with family and will update SW re choice. Will provide updates as available.   Dellie Burns, MSW, LCSW [phone] (coverage)          Barriers to Discharge: Continued Medical Work up  Expected Discharge Plan and Services In-house Referral: Clinical Social Work   Post Acute Care Choice: Skilled Nursing Facility   Expected Discharge Date: 11/28/23                                     Social Determinants of Health (SDOH) Interventions SDOH Screenings   Food Insecurity: No Food Insecurity (11/27/2023)  Housing: Low Risk  (11/27/2023)  Transportation Needs: No Transportation Needs (11/27/2023)  Utilities: Not At Risk (11/27/2023)  Depression (PHQ2-9): Low Risk  (01/16/2023)  Tobacco Use: Medium Risk (10/29/2023)    Readmission Risk Interventions    05/17/2022    3:02 PM  Readmission Risk Prevention Plan  Transportation Screening Complete  PCP or Specialist Appt within 5-7 Days Complete  Home Care Screening Complete  Medication Review (RN CM) Complete

## 2023-11-30 NOTE — Plan of Care (Signed)

## 2023-12-01 ENCOUNTER — Observation Stay (HOSPITAL_COMMUNITY): Payer: 59

## 2023-12-01 DIAGNOSIS — Z789 Other specified health status: Secondary | ICD-10-CM | POA: Diagnosis not present

## 2023-12-01 MED ORDER — OXYCODONE-ACETAMINOPHEN 5-325 MG PO TABS
1.0000 | ORAL_TABLET | Freq: Four times a day (QID) | ORAL | Status: DC | PRN
  Administered 2023-12-01 – 2023-12-04 (×9): 1 via ORAL
  Filled 2023-12-01 (×9): qty 1

## 2023-12-01 MED ORDER — GABAPENTIN 400 MG PO CAPS
1200.0000 mg | ORAL_CAPSULE | Freq: Two times a day (BID) | ORAL | Status: DC
  Administered 2023-12-01 – 2023-12-04 (×6): 1200 mg via ORAL
  Filled 2023-12-01 (×6): qty 3

## 2023-12-01 MED ORDER — OXYCODONE-ACETAMINOPHEN 10-325 MG PO TABS: 1.0000 | ORAL_TABLET | Freq: Four times a day (QID) | ORAL | Status: DC | PRN

## 2023-12-01 MED ORDER — CYCLOBENZAPRINE HCL 5 MG PO TABS
10.0000 mg | ORAL_TABLET | Freq: Three times a day (TID) | ORAL | Status: DC | PRN
  Administered 2023-12-02: 10 mg via ORAL
  Filled 2023-12-01: qty 2

## 2023-12-01 MED ORDER — OXYCODONE HCL 5 MG PO TABS
5.0000 mg | ORAL_TABLET | Freq: Four times a day (QID) | ORAL | Status: DC | PRN
  Administered 2023-12-03 – 2023-12-04 (×5): 5 mg via ORAL
  Filled 2023-12-01 (×5): qty 1

## 2023-12-01 NOTE — TOC Progression Note (Addendum)
 Transition of Care [facility]) - Progression Note    Patient Details  Name: [name] MRN: [id number] Date of Birth: [birth year]  Transition of Care [facility]) CM/SW Contact  Marliss Coots, LCSW Phone Number: 12/01/2023, 10:07 AM  Clinical Narrative:     10:07 AM CSW followed up with patient on SNF decision. Patient informed CSW that she has yet to make a decision and is interested in having her ankle evaluated prior to discharge. CSW offered to follow up later in the day. Patient accepted CSW offer. Patient provided discharge plan update to medical team.  4:01 PM CSW returned to patient's bedside to obtain SNF decision. Patient stated that she has yet to make SNF decision but review options. Patient stated that she is awaiting x-ray results prior to making a decision.   Expected Discharge Plan: Skilled Nursing Facility Barriers to Discharge: SNF Pending bed offer, Insurance Authorization  Expected Discharge Plan and Services In-house Referral: Clinical Social Work   Post Acute Care Choice: Skilled Nursing Facility Living arrangements for the past 2 months: Single Family Home Expected Discharge Date: 11/28/23                                     Social Determinants of Health (SDOH) Interventions SDOH Screenings   Food Insecurity: No Food Insecurity (11/27/2023)  Housing: Low Risk  (11/27/2023)  Transportation Needs: No Transportation Needs (11/27/2023)  Utilities: Not At Risk (11/27/2023)  Depression (PHQ2-9): Low Risk  (01/16/2023)  Tobacco Use: Medium Risk (10/29/2023)    Readmission Risk Interventions    05/17/2022    3:02 PM  Readmission Risk Prevention Plan  Transportation Screening Complete  PCP or Specialist Appt within 5-7 Days Complete  Home Care Screening Complete  Medication Review (RN CM) Complete

## 2023-12-01 NOTE — Plan of Care (Signed)
  Problem: Education: Goal: Knowledge of General Education information will improve Description: Including pain rating scale, medication(s)/side effects and non-pharmacologic comfort measures Outcome: Progressing   Problem: Pain Managment: Goal: General experience of comfort will improve and/or be controlled Outcome: Progressing   Problem: Skin Integrity: Goal: Risk for impaired skin integrity will decrease Outcome: Progressing

## 2023-12-01 NOTE — Progress Notes (Signed)
 Progress Note   Patient: [name] [id number]:[id number] DOB: 08/01/1961 DOA: 11/26/2023     0 DOS: the patient was seen and examined on 12/01/2023   Brief hospital course:   LOWELL MCGURK is a 63 y.o. female with medical history significant for active discitis on IV cefepime at home, obesity, multiple spinal surgeries, scoliosis, bilateral knee replacements, and history of distant osteomyelitis who comes in today because her PICC line is clogged and is having intermittent falls at home.  Assessment and Plan:  Discitis/osteomyelitis (POA) - Was receiving cefepime through PICC and home setting however PICC line did become clogged.  Attempts to unclog in the ER were unsuccessful.  PICC line subsequently replaced while inpatient.  Continue IV cefepime while inpatient as well as on discharge per previous regimen.  Recheck of patient's MRI notes improvement in her discitis.  Noted spinal canal stenosis similar to previous imaging.  Per previous neurosurgery notes from prior hospitalization, had recommended no surgical intervention until infection had cleared.   Physical debilitation muscle weakness with increased falls at home - Patient stating she has had increased falls, states her knees are giving out.  No overt change in her MRI compared to previous hospitalization.  Patient evaluated by PT/OT.  Patient herself initially denying wanting to visit a skilled nursing facility.  However had a lengthy discussion with the patient, patient's family, case managers and decided to come to consensus that patient does indeed need skilled rehab.  As such previous discharge home with home health was discontinued.  Working closely with case management on disposition planning to short-term rehab.  Right ankle dysfunction - Patient requesting to have her right ankle evaluated prior to discharge.  Denies any new injury or pain.  Admits that she has had ankle problems for over a month.  Will order ankle imaging.  Do  not see the need to consult Ortho for emergent evaluation at this time.  Patient follows with ortho in the outpatient setting.  Antibiotic induced diarrhea - Appears to be mild.  No fever or leukocytosis to suggest C. difficile.  Possible hyperthyroidism - TSH 0.1.  No note of patient taking levothyroxine.  Free T4 normal.  Patient herself says that she is followed with her thyroid function in the outpatient setting and her TSH is always low.  Likely subclinical hyperthyroid.   Hypertension - Still not well-controlled.  Yesterday added chlorthalidone 25 mg daily.  Will add amlodipine 5 mg daily today.  Monitor response.  Goals of care - Patient requesting evaluation of her right ankle prior to discharge.  Patient still has yet to pick a location for short-term rehab.  Ideally we will discharge patient tomorrow.      Subjective: Patient resting comfortably in bed this morning.   Denies any fever, chills, chest pain, nausea, vomiting, abdominal pain.  Very talkative this morning.  Requesting imaging of her ankle as she has had difficulty with her ankle for over a month.  No new injury, pain.  States she has had history of surgery in that ankle and wants to have it evaluated while she is here.  Physical Exam: Vitals:   11/30/23 2120 12/01/23 0547 12/01/23 0807 12/01/23 0826  BP: 132/79 117/77 (!) 150/82 (!) 115/50  Pulse: (!) 112 (!) 104 89 87  Resp: 19 18 17 18   Temp: 98.4 F (36.9 C) 98.2 F (36.8 C) 98.5 F (36.9 C)   TempSrc: Oral Oral Oral   SpO2: 94% 94% 93% 100%   GENERAL:  Alert, pleasant, no acute distress  HEENT:  EOMI CARDIOVASCULAR:  RRR, no murmurs appreciated RESPIRATORY:  Clear to auscultation, no wheezing, rales, or rhonchi GASTROINTESTINAL:  Soft, nontender, nondistended EXTREMITIES:  No LE edema bilaterally NEURO:  No new focal deficits appreciated SKIN:  No rashes noted PSYCH:  Appropriate mood and affect   Data Reviewed:  There are no new results to review  at this time.  Family Communication: None at bedside  Disposition: Status is: Observation The patient remains OBS appropriate and will d/c before 2 midnights.  Planned Discharge Destination: Short-term rehab    Time spent: 33 minutes  Author: Jermia Artis, DO 12/01/2023 12:51 PM  For on call review [email or web address].

## 2023-12-02 DIAGNOSIS — Z789 Other specified health status: Secondary | ICD-10-CM | POA: Diagnosis not present

## 2023-12-02 LAB — BASIC METABOLIC PANEL
Anion gap: 8 (ref 5–15)
BUN: 22 mg/dL (ref 8–23)
CO2: 28 mmol/L (ref 22–32)
Calcium: 9.1 mg/dL (ref 8.9–10.3)
Chloride: 100 mmol/L (ref 98–111)
Creatinine, Ser: 0.65 mg/dL (ref 0.44–1.00)
GFR, Estimated: >60 mL/min (ref >60)
Glucose, Bld: 112 mg/dL — ABNORMAL HIGH (ref 70–99)
Potassium: 3.9 mmol/L (ref 3.5–5.1)
Sodium: 136 mmol/L (ref 135–145)

## 2023-12-02 LAB — CBC
HCT: 39.1 % (ref 36.0–46.0)
Hemoglobin: 11.9 g/dL — ABNORMAL LOW (ref 12.0–15.0)
MCH: 25.8 pg — ABNORMAL LOW (ref 26.0–34.0)
MCHC: 30.4 g/dL (ref 30.0–36.0)
MCV: 84.8 fL (ref 80.0–100.0)
Platelets: 247 10*3/uL (ref 150–400)
RBC: 4.61 MIL/uL (ref 3.87–5.11)
RDW: 12.8 % (ref 11.5–15.5)
WBC: 9.3 10*3/uL (ref 4.0–10.5)
nRBC: 0 % (ref 0.0–0.2)

## 2023-12-02 NOTE — Plan of Care (Signed)

## 2023-12-02 NOTE — Progress Notes (Signed)
 Physical Therapy Treatment Patient Details Name: [name] MRN: [id number] DOB: Jul 13, 1961 Today's Date: 12/02/2023   History of Present Illness 63yo F who presented to the ED on 11/27/23  due to PICC line clog, reports of multiple recent falls. Admitted for w/u.PMH active discitis on IV cefepime at home, obesity, multiple spinal surgeries, scoliosis, B TKRs, hx of osteomyelitis, anxiety, fibro, HTN, shoulder surgery    PT Comments  Continuing work on functional mobility and activity tolerance;  Session focused on functional transfers; Needs +2 mod assist for supine>roll> sit; +2 mod assist for sit to stand and truncated step pivot transfer OOB to recliner; Takes incr time for many aspects mobility;   Very concerned about her RLE movement and function, especially her R ankle; Ace wrapped her ankle with a pull into eversion, and noted no overt ankle rolling into inversion; She would like to have a full RLE/ankle workup, Dr. Sharlene Dory is aware; I wonder if she can get her Outpt Ortho follow up appointments setup while here, so that she'll be seen on the early side when at rehab;   Took time to validate pt's frustration with the situation, and gain some rapport; Adriana wants to get back to her normal functional level; she would rather not have incr assistance, or need to use a wheelchair; This PT discussed using the tools like assistive devices, and services like short-term rehab as a means to get to her goals of independence   If plan is discharge home, recommend the following: Two people to help with walking and/or transfers;Assistance with cooking/housework;A lot of help with bathing/dressing/bathroom;Assist for transportation;Help with stairs or ramp for entrance   Can travel by private vehicle        Equipment Recommendations   (May need to consider a power wheelchair and ramp at some piont in the future)    Recommendations for Other Services       Precautions / Restrictions  Precautions Precautions: Fall;Other (comment) Recall of Precautions/Restrictions: Intact Precaution/Restrictions Comments: discitis, high fall risk, high pain levels due to fibromyalgia. Also, in standing pt with R ankle inversion and she does not feel it due to sensory deficits Required Braces or Orthoses:  (needs brace for R ankle, she reports she has a boot and a brace at home)     Mobility  Bed Mobility Overal bed mobility: Needs Assistance Bed Mobility: Rolling, Sidelying to Sit Rolling: Mod assist, Used rails Sidelying to sit: Mod assist, HOB elevated, Used rails       General bed mobility comments: pt needed increased time to come to EOB and eventual assist at hips with pad.    Transfers Overall transfer level: Needs assistance Equipment used: Rolling walker (2 wheels) Transfers: Sit to/from Stand, Bed to chair/wheelchair/BSC Sit to Stand: Mod assist, From elevated surface, +2 safety/equipment   Step pivot transfers: Mod assist, +2 physical assistance, +2 safety/equipment       General transfer comment: Light mod assist of 2 for safety and steadiness  -- pt requested to minimize physical assist for sit to stand and had a good power up; incr time to prep for and execute sit to stand; stood from elevated bed x2; heavy mod assist of 2 for step pivot transfer; required physical assist to advance RLE for stepping; very painful, slow steps, and opted to move teh bed out of the way and move the recliner to directly behind her to sit    Ambulation/Gait  Stairs             Wheelchair Mobility     Tilt Bed    Modified Rankin (Stroke Patients Only)       Balance     Sitting balance-[name]: Fair       Standing balance-[name]: Poor                              Communication Communication Communication: No apparent difficulties  Cognition Arousal: Alert Behavior During Therapy: WFL for tasks  assessed/performed   PT - Cognitive impairments: No apparent impairments                         Following commands: Intact      Cueing Cueing Techniques: Verbal cues, Gestural cues  Exercises      General Comments General comments (skin integrity, edema, etc.): Used Ace wrap to help with some R ankle stabilization during transfers      Pertinent Vitals/Pain Pain Assessment Pain Assessment: Faces Faces Pain Scale: Hurts worst Pain Location: back Pain Descriptors / Indicators: Discomfort, Crying Pain Intervention(s): Monitored during session, Premedicated before session    Home Living                          Prior Function            PT Goals (current goals can now be found in the care plan section) Acute Rehab PT Goals Patient Stated Goal: less pain/no falls; would like her ankle to be evauated by Ortho PT Goal Formulation: With patient Time For Goal Achievement: 12/11/23 Potential to Achieve Goals: Fair Progress towards PT goals: Progressing toward goals    Frequency    Min 1X/week      PT Plan      Co-evaluation              AM-PAC PT "6 Clicks" Mobility   Outcome Measure  Help needed turning from your back to your side while in a flat bed without using bedrails?: A Lot Help needed moving from lying on your back to sitting on the side of a flat bed without using bedrails?: A Lot Help needed moving to and from a bed to a chair (including a wheelchair)?: Total Help needed standing up from a chair using your arms (e.g., wheelchair or bedside chair)?: Total Help needed to walk in hospital room?: Total Help needed climbing 3-5 steps with a railing? : Total 6 Click Score: 8    End of Session Equipment Utilized During Treatment: Gait belt Activity Tolerance: Patient limited by pain;Patient limited by fatigue Patient left: in chair;with call bell/phone within reach Nurse Communication: Mobility status;Need for lift equipment  (stedu) PT Visit Diagnosis: Unsteadiness on feet (R26.81);Muscle weakness (generalized) (M62.81);Repeated falls (R29.6);Difficulty in walking, not elsewhere classified (R26.2);Pain Pain - part of body:  (back)     Time: 1055-1205 (minus approx 10) PT Time Calculation (min) (ACUTE ONLY): 70 min  Charges:    $Therapeutic Activity: 53-67 mins PT General Charges $$ ACUTE PT VISIT: 1 Visit                     Van Clines, PT  Acute Rehabilitation Services Office [phone] Secure Chat welcomed    Levi Aland 12/02/2023, 2:59 PM

## 2023-12-02 NOTE — Progress Notes (Signed)
 Pt complaining of pain 10/10, numbness and intermittent "pins and needles" in RLE (new symptoms). This nurse asked if MRI would be beneficial for pt. Pt also requested MRI.Pt requested 10mg  percocet (med listed in med rec). Dr. Truitt Merle per above. Dr. Algis Downs will order percocet.

## 2023-12-02 NOTE — Progress Notes (Signed)
 Dr. In with pt now.

## 2023-12-02 NOTE — Progress Notes (Incomplete)
 {  Select_TRH_Note:[id number]}

## 2023-12-02 NOTE — Progress Notes (Addendum)
 Progress Note   Patient: [name] ZOX:[id number] DOB: 04/01/1961 DOA: 11/26/2023     0 DOS: the patient was seen and examined on 12/02/2023   Brief hospital course:   WILHELMENIA ADDIS is a 63 y.o. female with medical history significant for active discitis on IV cefepime at home, obesity, multiple spinal surgeries, scoliosis, bilateral knee replacements, and history of distant osteomyelitis who comes in today because her PICC line is clogged and is having intermittent falls at home.  Assessment and Plan:  Delay in disposition -Patient continues to delay disposition and discharge.  Continues to delay deciding on appropriate STR facility.  Subsequently, delayed decision yesterday prior to right ankle workup, requesting that orthopedic surgery would be consulted.  Today refusing to pick a facility until consulting her previous neurosurgeon to assess the need for any procedure or surgery while she is in the hospital prior to discharge.  Continue to reiterate there is no urgent need for surgical intervention, her only initial reason for visitation was to have her PICC line replaced and that she continued to feel weak.  I will attempt again to discuss with the patient that she can follow-up with neurosurgery and orthopedic surgery in the outpatient setting at her discretion but there are no new orthopedic or neurosurgical diagnoses requiring their urgent consult.  Have reached out to pt's neurosurgeon Dr. Sueanne Margarita.  He states he will visit her later this evening.    Discitis/osteomyelitis (POA) - Was receiving cefepime through PICC and home setting however PICC line did become clogged.  Attempts to unclog in the ER were unsuccessful.  PICC line subsequently replaced while inpatient.  Continue IV cefepime while inpatient as well as on discharge per previous regimen.  Recheck of patient's MRI notes improvement in her discitis.  Noted spinal canal stenosis similar to previous imaging.  Per previous  neurosurgery notes from prior hospitalization, had recommended no surgical intervention until infection had cleared.  Previous surgery was had all been located with wound infections.  Physical debilitation muscle weakness with increased falls at home - Patient stating she has had increased falls, states her knees are giving out.  No overt change in her MRI compared to previous hospitalization.  Patient evaluated by PT/OT.  Patient herself initially denying wanting to visit a skilled nursing facility.  However had a lengthy discussion with the patient, patient's family, case managers and decided to come to consensus that patient does indeed need skilled rehab.  As such previous discharge home with home health was discontinued.  Working closely with case management on disposition planning to short-term rehab.  Right ankle dysfunction - Patient requesting to have her right ankle evaluated prior to discharge.  Denies any new injury or pain.  Admits that she has had ankle problems for over a month. Ankle imaging noting no acute process..  Do not see the need to consult Ortho for emergent evaluation at this time.  Patient follows with ortho in the outpatient setting.  Antibiotic induced diarrhea - Appears to be mild.  No fever or leukocytosis to suggest C. difficile.  Possible hyperthyroidism - TSH 0.1.  No note of patient taking levothyroxine.  Free T4 normal.  Patient herself says that she is followed with her thyroid function in the outpatient setting and her TSH is always low.  Likely subclinical hyperthyroid.   Hypertension - Still not well-controlled.  Yesterday added chlorthalidone 25 mg daily.  Will add amlodipine 5 mg daily today.  Monitor response.  Goals of care -  Patient requesting evaluation of her right ankle prior to discharge.  Patient still has yet to pick a location for short-term rehab.  Ideally we will discharge patient tomorrow.      Subjective: Patient resting comfortably in  bed this morning.   Denies any fever, chills, chest pain, nausea, vomiting, abdominal pain.  Informed her of her negative right ankle imaging, normal labs.  Continues to delay decision about placement.  Physical Exam: Vitals:   12/01/23 1740 12/01/23 1959 12/02/23 0447 12/02/23 0746  BP: (!) 150/84 131/80 123/87 (!) 153/87  Pulse: 95 98 89 88  Resp: 18 18 18 18   Temp: 98.1 F (36.7 C)  97.9 F (36.6 C) 97.8 F (36.6 C)  TempSrc: Oral   Oral  SpO2: 100% 95% 91% 96%   GENERAL:  Alert, pleasant, no acute distress  HEENT:  EOMI CARDIOVASCULAR:  RRR, no murmurs appreciated RESPIRATORY:  Clear to auscultation, no wheezing, rales, or rhonchi GASTROINTESTINAL:  Soft, nontender, nondistended EXTREMITIES:  No LE edema bilaterally NEURO:  No new focal deficits appreciated SKIN:  No rashes noted PSYCH:  Appropriate mood and affect   Data Reviewed:  There are no new results to review at this time.  Family Communication: None at bedside  Disposition: Status is: Observation The patient remains OBS appropriate and will d/c before 2 midnights.  Planned Discharge Destination: Short-term rehab    Time spent: 39 minutes  Author: Harveen Artis, DO 12/02/2023 2:12 PM  For on call review [email or web address].

## 2023-12-02 NOTE — TOC Progression Note (Addendum)
 Transition of Care [facility]) - Progression Note    Patient Details  Name: [name] MRN: [id number] Date of Birth: 04-24-1961  [facility]) CM/SW Contact  Marliss Coots, LCSW Phone Number: 12/02/2023, 10:25 AM  Clinical Narrative:     10:25 AM CSW returned to patient's bedside to obtain SNF decision. Patient informed CSW that she has yet made SNF decision as she is waiting to speak with neurosurgeon, Dr. Ovid Curd, regarding ankle pain and back abscess. CSW emphasized importance of SNF decision to prohibit discharge delays and prevent rescinded bed offers. Patient expressed understanding of this information.  3:19 PM Per hospitalist, Dr. Ovid Curd will be assessing patient later this evening at bedside.  Expected Discharge Plan: Skilled Nursing Facility Barriers to Discharge: SNF Pending bed offer, Insurance Authorization  Expected Discharge Plan and Services In-house Referral: Clinical Social Work   Post Acute Care Choice: Skilled Nursing Facility Living arrangements for the past 2 months: Single Family Home Expected Discharge Date: 11/28/23                                     Social Determinants of Health (SDOH) Interventions SDOH Screenings   Food Insecurity: No Food Insecurity (11/27/2023)  Housing: Low Risk  (11/27/2023)  Transportation Needs: No Transportation Needs (11/27/2023)  Utilities: Not At Risk (11/27/2023)  Depression (PHQ2-9): Low Risk  (01/16/2023)  Tobacco Use: Medium Risk (10/29/2023)    Readmission Risk Interventions    05/17/2022    3:02 PM  Readmission Risk Prevention Plan  Transportation Screening Complete  PCP or Specialist Appt within 5-7 Days Complete  Home Care Screening Complete  Medication Review (RN CM) Complete

## 2023-12-03 ENCOUNTER — Observation Stay (HOSPITAL_COMMUNITY): Payer: 59

## 2023-12-03 DIAGNOSIS — Z789 Other specified health status: Secondary | ICD-10-CM | POA: Diagnosis not present

## 2023-12-03 MED ORDER — HYDROXYZINE HCL 25 MG PO TABS
25.0000 mg | ORAL_TABLET | Freq: Three times a day (TID) | ORAL | Status: DC | PRN
Start: 2023-12-03 — End: 2023-12-04

## 2023-12-03 NOTE — TOC Progression Note (Addendum)
 Transition of Care [facility]) - Progression Note    Patient Details  Name: [name] MRN: [id number] Date of Birth: [birth year]  Transition of Care [facility]) CM/SW Contact  Marliss Coots, LCSW Phone Number: 12/03/2023, 10:03 AM  Clinical Narrative:     10:03 AM CSW returned to patient's bedside to obtain SNF decision. Patient informed CSW that she is awaiting ankle MRI results prior to making SNF decision. CSW emphasized importance of making decision (delay discharge, insurance authorization). Patient expressed understanding of this and interest in appealing discharge if discharge orders are placed. CSW informed patient possibility of following up on results outpatient at SNF and that SNF would await results as well and change therapy plans based on results. Patient expressed understanding of this information.  12:52 PM CSW returned to patient's bedside who informed CSW of SNF decision [facility]) and requested Dr. Stevphen Rochester to receive MRI results. CSW relayed information to medical team. CSW relayed bed acceptance to SNF.  1:44 PM Insurance authorization request is currently pending ([id number]). SNF confirmed that next bed availability would be tomorrow.  2:05 PM Patient's son, [name], called CSW to follow up on disposition plans and barriers (SNF choice, ankle pain, xray/mri). CSW relayed recent events to [facility]. [name] stated that he is to follow up with Dr. Stevphen Rochester regarding MRI results.  2:17 PM Insurance authorization approved [id number]) and valid 12/03/2023-12/05/2023. CSW relayed acceptance to SNF and medical team.  Expected Discharge Plan: Skilled Nursing Facility Barriers to Discharge: Other (must enter comment) (Delay in Disposition)  Expected Discharge Plan and Services In-house Referral: Clinical Social Work   Post Acute Care Choice: Skilled Nursing Facility Living arrangements for the past 2 months: Single Family Home Expected Discharge Date: 11/28/23                                      Social Determinants of Health (SDOH) Interventions SDOH Screenings   Food Insecurity: No Food Insecurity (11/27/2023)  Housing: Low Risk  (11/27/2023)  Transportation Needs: No Transportation Needs (11/27/2023)  Utilities: Not At Risk (11/27/2023)  Depression (PHQ2-9): Low Risk  (01/16/2023)  Tobacco Use: Medium Risk (10/29/2023)    Readmission Risk Interventions    05/17/2022    3:02 PM  Readmission Risk Prevention Plan  Transportation Screening Complete  PCP or Specialist Appt within 5-7 Days Complete  Home Care Screening Complete  Medication Review (RN CM) Complete

## 2023-12-03 NOTE — Progress Notes (Signed)
 MRI ordered. Pt went down for MRI.

## 2023-12-03 NOTE — Progress Notes (Signed)
 Occupational Therapy Treatment Patient Details Name: [name] MRN: [id number] DOB: 01-01-1961 Today's Date: 12/03/2023   History of present illness 63yo F who presented to the ED on 11/27/23  due to PICC line clog, reports of multiple recent falls. Admitted for w/u.PMH active discitis on IV cefepime at home, obesity, multiple spinal surgeries, scoliosis, B TKRs, hx of osteomyelitis, anxiety, fibro, HTN, shoulder surgery   OT comments  Session focus on increasing overall activity tolerance and functional mobility. Patient with need for increased time for bed mobility and heavy reliance on bed rails, but able to complete at supervision level. Gait belt fashioned into leg lifter to help with RLE with good success noted. Patient completing basic bathing ADL sitting EOB, then transferring to recliner. Patient max A in order to come into standing x2, with one LOB and sitting back onto bed. Able to pivot to recliner with significant increased time. Awareness is improving as to why therapies are recommending a rehab stay prior to discharging home. OT recommendation remains appropriate, OT will continue to follow.       If plan is discharge home, recommend the following:  Two people to help with walking and/or transfers;A lot of help with bathing/dressing/bathroom;Assistance with cooking/housework;Assist for transportation;Help with stairs or ramp for entrance   Equipment Recommendations  Other (comment) (defer to next venue)    Recommendations for Other Services      Precautions / Restrictions Precautions Precautions: Fall;Other (comment) Recall of Precautions/Restrictions: Intact Precaution/Restrictions Comments: discitis, high fall risk, high pain levels due to fibromyalgia. Also, in standing pt with R ankle inversion and she does not feel it due to sensory deficits Required Braces or Orthoses:  (needs brace for R ankle, she reports she has a boot and a brace at home) Restrictions Weight  Bearing Restrictions Per Provider Order: No       Mobility Bed Mobility Overal bed mobility: Needs Assistance Bed Mobility: Rolling, Sidelying to Sit Rolling: Used rails, Supervision Sidelying to sit: HOB elevated, Used rails, Supervision       General bed mobility comments: patient able to complete with increased time, with gait belt fashioned as leg lifter, and heavy use of bed rails    Transfers Overall transfer level: Needs assistance Equipment used: Rolling walker (2 wheels) Transfers: Sit to/from Stand, Bed to chair/wheelchair/BSC Sit to Stand: From elevated surface, Max assist     Step pivot transfers: Max assist     General transfer comment: heavy max A to stand x2, able to complete stand pivot transfer with significantly increased time     Balance Overall balance assessment: History of Falls, Needs assistance Sitting-balance support: Bilateral upper extremity supported, Feet supported Sitting balance-[name]: Fair     Standing balance support: Bilateral upper extremity supported, During functional activity, Reliant on assistive device for balance Standing balance-[name]: Poor Standing balance comment: heavy reliance on RW                           ADL either performed or assessed with clinical judgement   ADL Overall ADL's : Needs assistance/impaired     Grooming: Wash/dry hands;Wash/dry face;Set up;Sitting   Upper Body Bathing: Sitting;Set up               Toilet Transfer: Maximal assistance;Stand-pivot;Cueing for sequencing;Cueing for safety;Regular Toilet;BSC/3in1 Toilet Transfer Details (indicate cue type and reason): simulated with stand pivot to recliner         Functional mobility during ADLs:  Moderate assistance;+2 for physical assistance;+2 for safety/equipment;Rolling walker (2 wheels);Cueing for sequencing General ADL Comments: Session focus on increasing overall activity tolerance and functional mobility. Patient  with need for increased time for bed mobility and heavy reliance on bed rails, but able to complete at supervision level. Gait belt fashioned into leg lifter to help with RLE with good success noted. Patient completing basic bathing ADL sitting EOB, then transferring to recliner. Patient max A in order to come into standing x2, with one LOB and sitting back onto bed. Able to pivot to recliner with significant increased time. Awareness is improving as to why therapies are recommending a rehab stay prior to discharging home. OT recommendation remains appropriate, OT will continue to follow.    Extremity/Trunk Assessment              Occupational psychologist Communication: No apparent difficulties   Cognition Arousal: Alert Behavior During Therapy: WFL for tasks assessed/performed Cognition: Cognition impaired     Awareness: Intellectual awareness intact, Online awareness impaired Memory impairment (select all impairments): Short-term memory Attention impairment (select first level of impairment): Selective attention Executive functioning impairment (select all impairments): Reasoning, Problem solving OT - Cognition Comments: Improved awareness, but remains impaired                 Following commands: Intact        Cueing   Cueing Techniques: Verbal cues, Gestural cues  Exercises      Shoulder Instructions       General Comments      Pertinent Vitals/ Pain       Pain Assessment Pain Assessment: Faces Faces Pain Scale: Hurts a little bit Pain Location: generalized discomfort Pain Descriptors / Indicators: Discomfort, Grimacing, Guarding Pain Intervention(s): Limited activity within patient's tolerance, Monitored during session, Repositioned  Home Living                                          Prior Functioning/Environment              Frequency  Min 1X/week        Progress Toward  Goals  OT Goals(current goals can now be found in the care plan section)  Progress towards OT goals: Progressing toward goals  Acute Rehab OT Goals Patient Stated Goal: to get better OT Goal Formulation: With patient Time For Goal Achievement: 12/12/23 Potential to Achieve Goals: Fair  Plan      Co-evaluation                 AM-PAC OT "6 Clicks" Daily Activity     Outcome Measure   Help from another person eating meals?: A Little Help from another person taking care of personal grooming?: A Little Help from another person toileting, which includes using toliet, bedpan, or urinal?: Total Help from another person bathing (including washing, rinsing, drying)?: A Lot Help from another person to put on and taking off regular upper body clothing?: A Little Help from another person to put on and taking off regular lower body clothing?: A Lot 6 Click Score: 14    End of Session Equipment Utilized During Treatment: Gait belt;Rolling walker (2 wheels)  OT Visit Diagnosis: Unsteadiness on feet (R26.81);Other abnormalities of gait and mobility (R26.89);Repeated falls (R29.6);Muscle weakness (generalized) (M62.81);History of falling (  Z91.81);Other symptoms and signs involving cognitive function;Pain Pain - Right/Left: Right Pain - part of body: Ankle and joints of foot   Activity Tolerance Patient tolerated treatment well   Patient Left in chair;with call bell/phone within reach   Nurse Communication Need for lift equipment;Mobility status (Use Stedy back to bed)        Time: 1610-9604 OT Time Calculation (min): 41 min  Charges: OT General Charges $OT Visit: 1 Visit OT Treatments $Self Care/Home Management : 38-52 mins  Pollyann Glen E. Kenai Fluegel, OTR/L Acute Rehabilitation Services [phone]   Cherlyn Cushing 12/03/2023, 3:56 PM

## 2023-12-03 NOTE — Plan of Care (Signed)
  Problem: Education: Goal: Knowledge of General Education information will improve Description: Including pain rating scale, medication(s)/side effects and non-pharmacologic comfort measures Outcome: Progressing   Problem: Clinical Measurements: Goal: Ability to maintain clinical measurements within normal limits will improve Outcome: Progressing Goal: Will remain free from infection Outcome: Progressing   Problem: Activity: Goal: Risk for activity intolerance will decrease Outcome: Progressing   Problem: Nutrition: Goal: Adequate nutrition will be maintained Outcome: Progressing   Problem: Pain Managment: Goal: General experience of comfort will improve and/or be controlled Outcome: Progressing

## 2023-12-03 NOTE — Progress Notes (Signed)
 Progress Note   Patient: [name] YQM:[id number] DOB: 05/22/1961 DOA: 11/26/2023     0 DOS: the patient was seen and examined on 12/03/2023   Brief hospital course: 9 y o woman with PMH of discitis/osteomyelitis on home IV cefepime, multiple spinal  and lower extremity surgeries, scoliosis, who presented with a clotted PICC line, now exchanged.  Patient was seen by PT and SNF was recommended.  Assessment and Plan:  Clotted PICC line Patient presented with clotted PICC line.  Attempts to unclog the ED were unsuccessful.  PICC line was subsequently replaced on 11/27/2023 -Continue central line care.  Discitis/osteomyelitis (POA) Patient has been on IV cefepime, with end date 12/11/2023.  Per previous neurosurgery notes from prior hospitalization, had recommended no surgical intervention until infection had cleared.  -Continue IV cefepime.   Physical debilitation muscle weakness with increased falls at home Patient stating she has had increased falls, states her knees are giving out.  No overt change in her MRI compared to previous hospitalization.  Patient evaluated by PT/OT.  Patient herself initially denying wanting to visit a skilled nursing facility.  However providers had a lengthy discussion with the patient, patient's family, case managers and decided to come to consensus that patient does indeed need skilled rehab.  -Will discharge to SNF.  Right ankle dysfunction Patient requesting to have her right ankle evaluated prior to discharge.  Denies any new injury or pain.  Admits that she has had ankle problems for over a month. Ankle imaging, including x-ray and MRI, noting no acute process.  MRI findings to be forwarded to patient's orthopedic surgeon. -Outpatient follow-up.      Subclinical hypothyroidism versus euthyroid sick syndrome - TSH 0.1.  No note of patient taking levothyroxine.  Free T4 normal.  Patient herself says that she is followed with her thyroid function in the  outpatient setting and her TSH is always low.  Likely subclinical hyperthyroid.    Hypertension Patient on atenolol at home. Not clear how compliant she is with this medication. She was started on chlorthalidone and amlodipine while hospitalized. -Will discharge on chlorthalidone, amlodipine.       Subjective: Patient complains of right ankle discomfort when she tries to walk.  She also reports that the sensation in her right lower extremity seems impaired.  We discussed her MRI findings, which appear chronic.  Patient has been encouraged to follow-up with her outpatient providers for further evaluation and management of her lower extremity problems.  Physical Exam: Vitals:   12/02/23 1936 12/02/23 1939 12/03/23 0412 12/03/23 0813  BP: (!) 145/88 (!) 136/94 130/78 123/77  Pulse: (!) 102 (!) 104 95 88  Resp: 18  18   Temp: 98.1 F (36.7 C)  97.8 F (36.6 C) 97.8 F (36.6 C)  TempSrc: Oral  Oral Oral  SpO2: 98% 96% 97% 94%   General: Alert, oriented X3  Eyes: Pupils equal, reactive  Oral cavity: moist mucous membranes  Head: Atraumatic, normocephalic  Neck: supple  Chest: clear to auscultation. No crackles, no wheezes  CVS: S1,S2 RRR. No murmurs  Abd: No distention, soft, non-tender. No masses palpable  Extr: No edema   MSK: No joint deformities or swelling  Neurological: Grossly intact.    Data Reviewed:     Latest Ref Rng & Units 12/02/2023    3:02 AM 11/30/2023    3:54 AM 11/28/2023    3:50 AM  CBC  WBC 4.0 - 10.5 K/uL 9.3  8.4  6.9   Hemoglobin  12.0 - 15.0 g/dL 40.9  81.1  91.4   Hematocrit 36.0 - 46.0 % 39.1  36.3  35.4   Platelets 150 - 400 K/uL 247  252  243       Latest Ref Rng & Units 12/02/2023    3:02 AM 11/30/2023    3:54 AM 11/28/2023    3:50 AM  BMP  Glucose 70 - 99 mg/dL 782  956  213   BUN 8 - 23 mg/dL 22  10  14    Creatinine 0.44 - 1.00 mg/dL 0.86  5.78  4.69   Sodium 135 - 145 mmol/L 136  141  143   Potassium 3.5 - 5.1 mmol/L 3.9  3.8  3.9    Chloride 98 - 111 mmol/L 100  99  104   CO2 22 - 32 mmol/L 28  30  30    Calcium 8.9 - 10.3 mg/dL 9.1  9.6  9.0      Family Communication: n/a  Disposition: Status is: Observation   Planned Discharge Destination: Skilled nursing facility    Time spent: 50 minutes  Author: Marcine Matar, MD 12/03/2023 12:53 PM  For on call review [email or web address].

## 2023-12-04 DIAGNOSIS — Z789 Other specified health status: Secondary | ICD-10-CM | POA: Diagnosis not present

## 2023-12-04 MED ORDER — OXYCODONE-ACETAMINOPHEN 10-325 MG PO TABS: 1.0000 | ORAL_TABLET | Freq: Four times a day (QID) | ORAL | 0 refills | Status: DC | PRN

## 2023-12-04 MED ORDER — CHLORTHALIDONE 25 MG PO TABS: 25.0000 mg | ORAL_TABLET | Freq: Every day | ORAL | Status: DC

## 2023-12-04 MED ORDER — BUTALBITAL-APAP-CAFFEINE 50-325-40 MG PO TABS
1.0000 | ORAL_TABLET | Freq: Four times a day (QID) | ORAL | Status: DC | PRN
  Administered 2023-12-04: 1 via ORAL
  Filled 2023-12-04: qty 1

## 2023-12-04 MED ORDER — AMLODIPINE BESYLATE 5 MG PO TABS
5.0000 mg | ORAL_TABLET | Freq: Every day | ORAL | Status: DC
Start: 2023-12-04 — End: 2024-02-02

## 2023-12-04 NOTE — Discharge Summary (Signed)
 Physician Discharge Summary   Patient: [name] MRN: [id number] DOB: Feb 28, 1961  Admit date:     11/26/2023  Discharge date: 12/04/23  Discharge Physician: MDALA-GAUSI, Gwenette Greet   PCP: Irven Coe, MD   Recommendations at discharge:    Physical therapy  Discharge Diagnoses: Principal Problem:   Problem with vascular access Active Problems:   Lumbar degenerative disc disease   Fibromyalgia   Hyperthyroidism   Falls frequently  Resolved Problems:   * No resolved hospital problems. *  Hospital Course: 63 y o woman with PMH of discitis/osteomyelitis on home IV cefepime, multiple spinal  and lower extremity surgeries, scoliosis, who presented with a clotted PICC line, now exchanged.  Patient was seen by PT and SNF was recommended.   The rest of the hospital course is in problem-based format below:   Clotted PICC line Patient presented with clotted PICC line.  Attempts to unclog the ED were unsuccessful.  PICC line was subsequently replaced on 11/27/2023   Discitis/osteomyelitis (POA) Patient has been on IV cefepime, with end date 12/11/2023.  Per previous neurosurgery notes from prior hospitalization, had recommended no surgical intervention until infection had cleared.     Physical debilitation muscle weakness with increased falls at home Patient stating she has had increased falls, states her knees are giving out.  No overt change in her MRI compared to previous hospitalization.  Patient evaluated by PT/OT.  Patient herself initially didn't want to discharge to visit a skilled nursing facility.  However providers had a lengthy discussion with the patient, patient's family, case managers and came to consensus that patient does indeed need skilled rehab.  Patient is discharging to a skilled nursing facility.    Right ankle dysfunction Patient requesting to have her right ankle evaluated prior to discharge.  Denies any new injury or pain.  Admits that she has had ankle  problems for over a month. Ankle imaging, including x-ray and MRI, noted no acute process.  MRI findings forwarded to patient's orthopedic surgeon. She will be followed up as outpatient.  Right ankle air cast was ordered, per PT recommendations.     Subclinical hypothyroidism versus euthyroid sick syndrome  TSH 0.1.  No note of patient taking levothyroxine.  Free T4 normal.  Patient herself says that she is followed with her thyroid function in the outpatient setting and her TSH is always low.  Likely subclinical hyperthyroid versus euthyroid sick syndrome.    Hypertension Patient on atenolol at home. Not clear how compliant she is with this medication. She was started on chlorthalidone and amlodipine while hospitalized, and is being discharged on all three medications.        Consultants: n/a Procedures performed: n/a  Disposition: Skilled nursing facility Diet recommendation:  Discharge Diet Orders (From admission, onward)     Start     Ordered   12/04/23 0000  Diet - low sodium heart healthy        12/04/23 0952   11/28/23 0000  Diet - low sodium heart healthy        11/28/23 1158           Cardiac diet DISCHARGE MEDICATION: Allergies as of 12/04/2023       Reactions   Monosodium Glutamate Anaphylaxis, Swelling   Eyes swollen shut, facial swelling, tongue swelling.   Shellfish Allergy Anaphylaxis   Baclofen Nausea Only   Dizziness and increase muscle spasm   Celebrex [celecoxib] Itching   Only allergic to generic brand   Contrast Media [  iodinated Contrast Media] Itching, Nausea Only   "could not walk"   Diclofenac Itching   Generic Diclefenac gel causes itching. Can take the name brand Voltaren gel   Gadolinium    Allergic to MRI contrast dye per patient.   Other Other (See Comments)   Pet dander,    Latex Itching, Rash   Latex glove with powder; bite blocks used for EGD studies        Medication List     TAKE these medications    acetaminophen 325  MG tablet Commonly known as: TYLENOL Take 2 tablets (650 mg total) by mouth every 6 (six) hours as needed for mild pain (pain score 1-3) (or Fever >/= 101).   albuterol 108 (90 Base) MCG/ACT inhaler Commonly known as: VENTOLIN HFA Inhale 2 puffs into the lungs every 6 (six) hours as needed for wheezing or shortness of breath.   amLODipine 5 MG tablet Commonly known as: NORVASC Take 1 tablet (5 mg total) by mouth daily.   ascorbic acid 500 MG tablet Commonly known as: VITAMIN C Take 500 mg by mouth daily.   aspirin EC 81 MG tablet Take 81 mg by mouth in the morning.   atenolol 50 MG tablet Commonly known as: TENORMIN Take 1 tablet (50 mg total) by mouth daily.   CALCIUM-MAGNESIUM-ZINC-D3 PO Take 1 tablet by mouth in the morning, at noon, and at bedtime.   ceFEPime IVPB Commonly known as: MAXIPIME Inject 2 g into the vein every 8 (eight) hours. Indication:  Discitis/osteomyelitis  First Dose: Yes Last Day of Therapy:  12/11/23 Labs - Once weekly:  CBC/D and BMP, Labs - Once weekly: ESR and CRP Method of administration: IV Push Method of administration may be changed at the discretion of home infusion pharmacist based upon assessment of the patient and/or caregiver's ability to self-administer the medication ordered.   chlorthalidone 25 MG tablet Commonly known as: HYGROTON Take 1 tablet (25 mg total) by mouth daily.   chlorzoxazone 500 MG tablet Commonly known as: PARAFON Take 1 tablet (500 mg total) by mouth 3 (three) times daily. What changed: when to take this   cyanocobalamin 1000 MCG tablet Commonly known as: VITAMIN B12 Take 1,000 mcg by mouth once a week.   cyclobenzaprine 10 MG tablet Commonly known as: FLEXERIL Take 10 mg by mouth 3 (three) times daily.   diazepam 5 MG tablet Commonly known as: VALIUM Take 1 tablet (5 mg total) by mouth 2 (two) times daily. What changed:  when to take this additional instructions   diclofenac Sodium 1 % Gel Commonly  known as: VOLTAREN Apply 2 g topically 4 (four) times daily as needed (pain).   diphenhydrAMINE 25 MG tablet Commonly known as: BENADRYL Take 50 mg by mouth in the morning, at noon, in the evening, and at bedtime.   ferrous sulfate 325 (65 FE) MG EC tablet Take 325 mg by mouth in the morning.   gabapentin 600 MG tablet Commonly known as: NEURONTIN Take 2 tablets (1,200 mg total) by mouth 3 (three) times daily.   hydrOXYzine 50 MG tablet Commonly known as: ATARAX Take 50 mg by mouth 2 (two) times daily as needed for anxiety (sleep).   lubiprostone 24 MCG capsule Commonly known as: Amitiza Take 1 capsule (24 mcg total) by mouth 2 (two) times daily with a meal. What changed:  how much to take when to take this   methocarbamol 500 MG tablet Commonly known as: ROBAXIN Take 500 mg by mouth 4 (  four) times daily.   Narcan 4 MG/0.1ML Liqd nasal spray kit Generic drug: naloxone Place 1 spray into the nose once. What changed:  when to take this reasons to take this   omeprazole 20 MG capsule Commonly known as: PRILOSEC Take 20 mg by mouth in the morning and at bedtime.   ondansetron 4 MG tablet Commonly known as: ZOFRAN Take 1 tablet (4 mg total) by mouth every 6 (six) hours as needed for nausea.   oxyCODONE-acetaminophen 10-325 MG tablet Commonly known as: PERCOCET Take 1 tablet by mouth every 6 (six) hours as needed for pain. What changed: when to take this   VITAMIN D-3 PO Take 1 tablet by mouth in the morning.        Contact information for after-discharge care     Destination     [facility] Preferred SNF .   Service: Skilled Nursing Contact information: [address] [place] [address] [phone]                    Discharge Exam: There were no vitals filed for this visit.  Physical Exam on Day of Discharge   General: Alert, cheerful, oriented X3  Oral cavity: moist mucous membranes   Neck: supple  Chest: clear to auscultation. No crackles, no wheezes  CVS: S1,S2 RRR. No murmurs  Abd: No distention, soft, non-tender. No masses palpable  Extr: No edema    Condition at discharge: stable  The results of significant diagnostics from this hospitalization (including imaging, microbiology, ancillary and laboratory) are listed below for reference.   Imaging Studies: MR ANKLE RIGHT WO CONTRAST Result Date: 12/03/2023 CLINICAL DATA:  Chronic right ankle pain.  Prior ORIF. EXAM: MRI OF THE RIGHT ANKLE WITHOUT CONTRAST TECHNIQUE: Multiplanar, multisequence MR imaging of the ankle was performed. No intravenous contrast was administered. COMPARISON:  Right ankle x-rays dated December 01, 2023. FINDINGS: TENDONS Peroneal: Peroneal longus tendon intact. Peroneal brevis intact. Posteromedial: Posterior tibial tendon intact. Flexor digitorum longus tendon intact. Flexor hallucis longus tendon intact. Anterior: Tibialis anterior tendon intact. Extensor hallucis longus tendon intact. Extensor digitorum longus tendon intact. Achilles:  Intact. Mild distal tendinosis. Plantar Fascia: Intact. Slight thickening of the proximal central pain. LIGAMENTS Lateral: Anterior talofibular ligament intact. Calcaneofibular ligament intact. Posterior talofibular ligament intact. Anterior tibiofibular ligament is not well-visualized and may be chronically torn. Posterior tibiofibular ligament intact. Medial: Deltoid ligament intact. Spring ligament intact. CARTILAGE Ankle Joint: No joint effusion. Normal ankle mortise. No chondral defect. Subtalar Joints/Sinus Tarsi: Normal subtalar joints. No subtalar joint effusion. Normal sinus tarsi. Bones: Susceptibility artifact prior distal fibular ORIF and syndesmosis repair. No marrow signal abnormality. No fracture or dislocation. Mild midfoot degenerative changes. Soft Tissue: No soft tissue mass or fluid collection. IMPRESSION: 1. Mild distal Achilles tendinosis. 2. Mild  midfoot osteoarthritis. 3. Prior distal fibular ORIF and syndesmosis repair. Electronically Signed   By: Obie Dredge M.D.   On: 12/03/2023 09:58   DG Ankle 2 Views Right Result Date: 12/01/2023 CLINICAL DATA:  Right ankle pain EXAM: RIGHT ANKLE - 2 VIEW COMPARISON:  11/19/2023 FINDINGS: Frontal and lateral views of the right ankle are obtained. Stable position of the plate and screw fixation within the distal fibula and surgical anchor within the anterior margin of the distal tibia. No acute fracture, subluxation, or dislocation. Mild osteoarthritis of the midfoot and hindfoot unchanged. Mild diffuse subcutaneous edema. IMPRESSION: 1. Stable postsurgical changes.  No acute bony abnormality. 2. Mild subcutaneous edema.  Electronically Signed   By: Sharlet Salina M.D.   On: 12/01/2023 17:45   [place] EKG Site Rite Result Date: 11/27/2023 If [facility] image not attached, placement could not be confirmed due to current cardiac rhythm.  DG Chest Portable 1 View Result Date: 11/26/2023 CLINICAL DATA:  PICC malfunction EXAM: PORTABLE CHEST 1 VIEW COMPARISON:  None Available. FINDINGS: Hardware in the cervical spine. Low lung volumes. Atelectasis or scarring in the right infrahilar lung. Borderline cardiac enlargement. No pleural effusion or pneumothorax. Aortic atherosclerosis. Right upper extremity central venous catheter with looped segment at the right axilla and slightly kinked appearance. The tip overlies the SVC origin. Posterior spinal rods in the lower thoracic spine IMPRESSION: 1. Right upper extremity central venous catheter with looped segment at the right axilla and slightly kinked appearance. The tip overlies the SVC origin. 2. Low lung volumes with atelectasis or scarring in the right infrahilar lung. Electronically Signed   By: Jasmine Pang M.D.   On: 11/26/2023 18:24   MR THORACIC SPINE WO CONTRAST Result Date: 11/26/2023 CLINICAL DATA:  Osteomyelitis, thoracic without only; Low back pain,  symptoms persist with > 6 wks treatment bilat LE weakness recent tx for vertebral OM without only. EXAM: MRI THORACIC AND LUMBAR SPINE WITHOUT CONTRAST TECHNIQUE: Multiplanar and multiecho pulse sequences of the thoracic and lumbar spine were obtained without intravenous contrast. COMPARISON:  MRI thoracic spine 10/28/2023. MRI lumbar spine 10/29/2023. FINDINGS: MRI THORACIC SPINE FINDINGS Alignment:  Normal. Vertebrae: Unchanged postoperative appearance from prior posterior spinal fusion extending from T10 caudally into the lumbar spine. Decreased fluid signal in the T9-10 disc space with slightly increased edema in the surrounding endplates, suggestive of possible improvement in discitis with residual osteomyelitis. Acquired fusion at T8-9. Multilevel degenerative endplate changes. Cord:  Normal spinal cord signal and volume. Paraspinal and other soft tissues: Unremarkable. Disc levels: Small disc bulges at multiple levels without significant mass effect on the spinal cord or spinal canal stenosis. At T9-10, there is a moderate disc bulge with superimposed central disc extrusion resulting in severe spinal canal stenosis with spinal cord compression, unchanged from prior. Bilateral facet arthropathy contributes to severe bilateral neural foraminal narrowing at T9-10. MRI LUMBAR SPINE FINDINGS Segmentation:  Standard. Alignment:  Unchanged mild grade 1 anterolisthesis of L4 on L5. Vertebrae: Unchanged postoperative appearance of prior posterior spinal fusion extending from the thoracic spine through L5. Prior anterior lumbar interbody fusion at L5-S1. Acquired fusion across the L1-2 disc space. Otherwise normal vertebral body heights with without suspicious marrow lesions or evidence of discitis-osteomyelitis. Conus medullaris and cauda equina: Conus extends to the L1-2 level. Conus and cauda equina appear normal. Paraspinal and other soft tissues: No acute findings. Disc levels: Small residual disc bulge at L2-3  without significant spinal canal stenosis or high-grade neural foraminal narrowing at any of the operative levels. IMPRESSION: 1. Decreased fluid signal in the T9-10 disc space with slightly increased edema in the surrounding endplates, suggestive of possible improvement in discitis with residual osteomyelitis. 2. Unchanged severe spinal canal stenosis with spinal cord compression and severe bilateral neural foraminal narrowing at T9-10. 3. Unchanged postoperative appearance of prior posterior spinal fusion extending from the thoracic spine through L5. Prior anterior lumbar interbody fusion at L5-S1. No significant spinal canal stenosis or high-grade neural foraminal narrowing at any of the operative levels. Electronically Signed   By: Orvan Falconer M.D.   On: 11/26/2023 17:08   MR LUMBAR SPINE WO CONTRAST Result Date: 11/26/2023 CLINICAL DATA:  Osteomyelitis, thoracic  without only; Low back pain, symptoms persist with > 6 wks treatment bilat LE weakness recent tx for vertebral OM without only. EXAM: MRI THORACIC AND LUMBAR SPINE WITHOUT CONTRAST TECHNIQUE: Multiplanar and multiecho pulse sequences of the thoracic and lumbar spine were obtained without intravenous contrast. COMPARISON:  MRI thoracic spine 10/28/2023. MRI lumbar spine 10/29/2023. FINDINGS: MRI THORACIC SPINE FINDINGS Alignment:  Normal. Vertebrae: Unchanged postoperative appearance from prior posterior spinal fusion extending from T10 caudally into the lumbar spine. Decreased fluid signal in the T9-10 disc space with slightly increased edema in the surrounding endplates, suggestive of possible improvement in discitis with residual osteomyelitis. Acquired fusion at T8-9. Multilevel degenerative endplate changes. Cord:  Normal spinal cord signal and volume. Paraspinal and other soft tissues: Unremarkable. Disc levels: Small disc bulges at multiple levels without significant mass effect on the spinal cord or spinal canal stenosis. At T9-10, there  is a moderate disc bulge with superimposed central disc extrusion resulting in severe spinal canal stenosis with spinal cord compression, unchanged from prior. Bilateral facet arthropathy contributes to severe bilateral neural foraminal narrowing at T9-10. MRI LUMBAR SPINE FINDINGS Segmentation:  Standard. Alignment:  Unchanged mild grade 1 anterolisthesis of L4 on L5. Vertebrae: Unchanged postoperative appearance of prior posterior spinal fusion extending from the thoracic spine through L5. Prior anterior lumbar interbody fusion at L5-S1. Acquired fusion across the L1-2 disc space. Otherwise normal vertebral body heights with without suspicious marrow lesions or evidence of discitis-osteomyelitis. Conus medullaris and cauda equina: Conus extends to the L1-2 level. Conus and cauda equina appear normal. Paraspinal and other soft tissues: No acute findings. Disc levels: Small residual disc bulge at L2-3 without significant spinal canal stenosis or high-grade neural foraminal narrowing at any of the operative levels. IMPRESSION: 1. Decreased fluid signal in the T9-10 disc space with slightly increased edema in the surrounding endplates, suggestive of possible improvement in discitis with residual osteomyelitis. 2. Unchanged severe spinal canal stenosis with spinal cord compression and severe bilateral neural foraminal narrowing at T9-10. 3. Unchanged postoperative appearance of prior posterior spinal fusion extending from the thoracic spine through L5. Prior anterior lumbar interbody fusion at L5-S1. No significant spinal canal stenosis or high-grade neural foraminal narrowing at any of the operative levels. Electronically Signed   By: Orvan Falconer M.D.   On: 11/26/2023 17:08   VAS [place] LOWER EXTREMITY VENOUS (DVT) Result Date: 11/19/2023  Lower Venous DVT Study Patient Name:  JEANELLE DAKE  Date of Exam:   11/19/2023 Medical Rec #: [id number]        Accession #:    [id number] Date of Birth: August 25, 1961        Patient Gender: F Patient Age:   63 years Exam Location:  [facility] Procedure:      VAS [place] LOWER EXTREMITY VENOUS (DVT) Referring Phys: DAN FLOYD --------------------------------------------------------------------------------  Indications: Swelling, and Edema.  Risk Factors: Surgery Surgery on right foot Trauma Recent fall past pregnancy and obesity. Comparison Study: No significant changes seen since the previous exam 03/26/19 Performing Technologist: Shona Simpson  Examination Guidelines: A complete evaluation includes B-mode imaging, spectral Doppler, color Doppler, and power Doppler as needed of all accessible portions of each vessel. Bilateral testing is considered an integral part of a complete examination. Limited examinations for reoccurring indications may be performed as noted. The reflux portion of the exam is performed with the patient in reverse Trendelenburg.  +---------+---------------+---------+-----------+----------+--------------+ RIGHT    CompressibilityPhasicitySpontaneityPropertiesThrombus Aging +---------+---------------+---------+-----------+----------+--------------+ CFV      Full  Yes      Yes                                 +---------+---------------+---------+-----------+----------+--------------+ SFJ      Full                                                        +---------+---------------+---------+-----------+----------+--------------+ FV Prox  Full                                                        +---------+---------------+---------+-----------+----------+--------------+ FV Mid   Full                                                        +---------+---------------+---------+-----------+----------+--------------+ FV DistalFull                                                        +---------+---------------+---------+-----------+----------+--------------+ PFV      Full                                                         +---------+---------------+---------+-----------+----------+--------------+ POP      Full           Yes      Yes                                 +---------+---------------+---------+-----------+----------+--------------+ PTV      Full                                                        +---------+---------------+---------+-----------+----------+--------------+ PERO     Full                                                        +---------+---------------+---------+-----------+----------+--------------+   +----+---------------+---------+-----------+----------+--------------+ LEFTCompressibilityPhasicitySpontaneityPropertiesThrombus Aging +----+---------------+---------+-----------+----------+--------------+ CFV Full           Yes      Yes                                 +----+---------------+---------+-----------+----------+--------------+     Summary: RIGHT: - There is no evidence of deep vein  thrombosis in the lower extremity.  - No cystic structure found in the popliteal fossa.  LEFT: - No evidence of common femoral vein obstruction.   *See table(s) above for measurements and observations. Electronically signed by Gerarda Fraction on 11/19/2023 at 4:12:50 PM.    Final    DG Ankle Complete Right Result Date: 11/19/2023 CLINICAL DATA:  Right ankle swelling. History of prior surgery approximately 5 years ago. EXAM: RIGHT ANKLE - COMPLETE 3+ VIEW COMPARISON:  None available this time. FINDINGS: Status post ORIF of the distal fibula with lateral plate and screw fixation construct and syndesmotic stabilization. Hardware appears intact with appropriate alignment. No evidence of significant periprosthetic lucency. No acute fracture or dislocation. Ankle mortise appears congruent. Soft tissue swelling of the ankle, most pronounced anterolaterally. IMPRESSION: Intact ORIF of the distal fibula and syndesmotic stabilization with appropriate alignment. No acute osseous  abnormality. Electronically Signed   By: Hart Robinsons M.D.   On: 11/19/2023 12:21    Microbiology: Results for orders placed or performed during the hospital encounter of 10/28/23  Blood culture (routine x 2)     Status: None   Collection Time: 10/28/23  8:12 PM   Specimen: BLOOD  Result Value Ref Range Status   Specimen Description BLOOD LEFT ANTECUBITAL  Final   Special Requests   Final    BOTTLES DRAWN AEROBIC ONLY Blood Culture adequate volume   Culture   Final    NO GROWTH 5 DAYS Performed at [facility], [address], [place], [name] [address]    Report Status 11/02/2023 FINAL  Final  Blood culture (routine x 2)     Status: None   Collection Time: 10/28/23  8:24 PM   Specimen: BLOOD  Result Value Ref Range Status   Specimen Description BLOOD RIGHT ANTECUBITAL  Final   Special Requests   Final    BOTTLES DRAWN AEROBIC AND ANAEROBIC Blood Culture adequate volume   Culture   Final    NO GROWTH 5 DAYS Performed at [facility], [address] [place], [name] [address]    Report Status 11/02/2023 FINAL  Final  Aerobic/Anaerobic Culture w Gram Stain (surgical/deep wound)     Status: None   Collection Time: 10/29/23  3:45 PM   Specimen: Fine Needle Aspirate  Result Value Ref Range Status   Specimen Description NEEDLE ASPIRATE  Final   Special Requests INTERVERTEBRAL DISC  Final   Gram Stain NO WBC SEEN NO ORGANISMS SEEN   Final   Culture   Final    No growth aerobically or anaerobically. Performed at [facility], [address] [place], [name] [address]    Report Status 11/03/2023 FINAL  Final  Aerobic/Anaerobic Culture w Gram Stain (surgical/deep wound)     Status: None   Collection Time: 10/29/23  3:45 PM   Specimen: NEEDLE ASPIRATE; Abscess  Result Value Ref Range Status   Specimen Description NEEDLE ASPIRATE  Final   Special Requests INTERVERTEBRAL DISC  Final   Gram Stain NO WBC SEEN NO ORGANISMS SEEN   Final   Culture   Final     No growth aerobically or anaerobically. Performed at [facility], [address] [place], [name] [address]    Report Status 11/03/2023 FINAL  Final    Labs: CBC: Recent Labs  Lab 11/28/23 0350 11/30/23 0354 12/02/23 0302  WBC 6.9 8.4 9.3  HGB 10.7* 11.2* 11.9*  HCT 35.4* 36.3 39.1  MCV 85.7 85.0 84.8  PLT 243 252 247   Basic Metabolic Panel: Recent Labs  Lab 11/28/23 0350 11/30/23 0354 12/02/23 0302  NA 143 141 136  K 3.9 3.8 3.9  CL 104 99 100  CO2 30 30 28   GLUCOSE 109* 101* 112*  BUN 14 10 22   CREATININE 0.65 0.56 0.65  CALCIUM 9.0 9.6 9.1  MG 1.9  --   --    Liver Function Tests: No results for input(s): "AST", "ALT", "ALKPHOS", "BILITOT", "PROT", "ALBUMIN" in the last 168 hours. CBG: Recent Labs  Lab 11/27/23 2121 11/28/23 0036 11/28/23 0623  GLUCAP 142* 112* 111*    Discharge time spent: greater than 30 minutes.  Signed: MDALA-GAUSI, Gwenette Greet, MD [facility] 12/04/2023

## 2023-12-04 NOTE — TOC Transition Note (Signed)
 Transition of Care [facility]) - Discharge Note   Patient Details  Name: [name] MRN: [id number] Date of Birth: 10-Sep-1961  Transition [facility]) CM/SW Contact:  Deatra Robinson, LCSW Phone Number: 12/04/2023, 2:48 PM   Clinical Narrative:  Pt for dc to [facility] today. Spoke to [place] in admissions who confirmed they are prepared to admit pt to room 803B. Pt aware of dc and reports agreeable. RN provided with number for report and PTAR arranged for transport. SW signing off at dc.   Dellie Burns, MSW, LCSW [phone] (coverage)       Final next level of care: Skilled Nursing Facility Barriers to Discharge: Barriers Resolved   Patient Goals and CMS Choice Patient states their goals for this hospitalization and ongoing recovery are:: SNF CMS Medicare.gov Compare Post Acute Care list provided to:: Patient Choice offered to / list presented to : Patient      Discharge Placement              Patient chooses bed at: [facility] Patient to be transferred to facility by: PTAR Name of family member notified: Pt to update family Patient and family notified of of transfer: 12/04/23  Discharge Plan and Services Additional resources added to the After Visit Summary for   In-house Referral: Clinical Social Work   Post Acute Care Choice: Skilled Nursing Facility                               Social Drivers of Health (SDOH) Interventions SDOH Screenings   Food Insecurity: No Food Insecurity (11/27/2023)  Housing: Low Risk  (11/27/2023)  Transportation Needs: No Transportation Needs (11/27/2023)  Utilities: Not At Risk (11/27/2023)  Depression (PHQ2-9): Low Risk  (01/16/2023)  Tobacco Use: Medium Risk (10/29/2023)     Readmission Risk Interventions    05/17/2022    3:02 PM  Readmission Risk Prevention Plan  Transportation Screening Complete  PCP or Specialist Appt within 5-7 Days Complete  Home Care Screening Complete  Medication Review (RN CM)  Complete

## 2023-12-04 NOTE — Progress Notes (Signed)
 Report called to [facility] room 803B. Report given to Jon Gills, Charity fundraiser.

## 2023-12-04 NOTE — Progress Notes (Signed)
 Orthopedic Tech Progress Note Patient Details:  [name] 1960-10-30 [id number]  Ortho Devices Type of Ortho Device: Ankle Air splint Ortho Device/Splint Location: RLE Ortho Device/Splint Interventions: Ordered   Post Interventions Patient Tolerated: Well  Lovett Calender 12/04/2023, 12:56 PM

## 2023-12-04 NOTE — Progress Notes (Signed)
 Physical Therapy Treatment Patient Details Name: [name] MRN: [id number] DOB: [birth year] Today's Date: 12/04/2023   History of Present Illness 62yo F who presented to the ED on 11/27/23  due to PICC line clog, reports of multiple recent falls. Admitted for w/u.PMH active discitis on IV cefepime at home, obesity, multiple spinal surgeries, scoliosis, B TKRs, hx of osteomyelitis, anxiety, fibro, HTN, shoulder surgery    PT Comments  Pt seen for PT tx with pt agreeable. Pt presents with significant BLE weakness (R>L). Pt requires max assist for bed mobility with heavy reliance on hospital bed features. After multiple attempts pt is able to transfer STS from elevated EOB with max assist & RW but unable to weight shift or move feet L/R to attempt stepping to recliner. Utilized stedy to safely transfer pt to recliner. Continue to recommend post acute rehab <3 hours therapy/day.    If plan is discharge home, recommend the following: Two people to help with walking and/or transfers;Assistance with cooking/housework;A lot of help with bathing/dressing/bathroom;Assist for transportation;Help with stairs or ramp for entrance   Can travel by private vehicle     No  Equipment Recommendations  Other (comment) (defer to next venue)    Recommendations for Other Services       Precautions / Restrictions Precautions Precautions: Fall;Other (comment) Recall of Precautions/Restrictions: Intact Precaution/Restrictions Comments: discitis, high fall risk, high pain levels due to fibromyalgia. Also, in standing pt with R ankle inversion and she does not feel it due to sensory deficits Restrictions Weight Bearing Restrictions Per Provider Order: No     Mobility  Bed Mobility Overal bed mobility: Needs Assistance Bed Mobility: Supine to Sit     Supine to sit: Max assist, HOB elevated, Used rails (cuing & assistance to move BLE to EOB, pt uprights trunk with use of HOB elevated & bed rails, PT  provides assistance scooting chuck pad to EOB)          Transfers Overall transfer level: Needs assistance Equipment used: Rolling walker (2 wheels), Ambulation equipment used Transfers: Sit to/from Stand Sit to Stand: Max assist, From elevated surface, Via lift equipment           General transfer comment: STS from elevated EOB with cuing to push with 1 UE with pt requiring multiple attempts but finally able to transfer STS with RW & max assist +1 x 2 attempts. Unable to move feet L/R so transitioned to using stedy for bed>recliner transfer. Transfer via Lift Equipment: Stedy  Ambulation/Gait                   Stairs             Wheelchair Mobility     Tilt Bed    Modified Rankin (Stroke Patients Only)       Balance Overall balance assessment: History of Falls, Needs assistance Sitting-balance support: Bilateral upper extremity supported, Feet supported Sitting balance-[name]: Fair Sitting balance - Comments: supervision static sitting EOB   Standing balance support: Bilateral upper extremity supported, Reliant on assistive device for balance, During functional activity Standing balance-[name]: Poor Standing balance comment: does have intermittent LOB, assistance to slowly lower back onto bed                            Communication Communication Communication: No apparent difficulties  Cognition Arousal: Alert Behavior During Therapy: WFL for tasks assessed/performed   PT - Cognitive impairments: No apparent  impairments                                Cueing    Exercises      General Comments General comments (skin integrity, edema, etc.): utilized ace wrap for eversion to prevent inversion, ace wrapped doffed at end of session.      Pertinent Vitals/Pain Pain Assessment Pain Assessment: Faces Pain Score: 10-Worst pain ever Pain Location: pt reports "I have fibromyalgia so it's 10 out of 10" Pain  Descriptors / Indicators: Discomfort Pain Intervention(s): Monitored during session    Home Living                          Prior Function            PT Goals (current goals can now be found in the care plan section) Acute Rehab PT Goals Patient Stated Goal: less pain/no falls; would like her ankle to be evauated by Ortho PT Goal Formulation: With patient Time For Goal Achievement: 12/11/23 Potential to Achieve Goals: Fair Progress towards PT goals: Progressing toward goals    Frequency    Min 1X/week      PT Plan      Co-evaluation              AM-PAC PT "6 Clicks" Mobility   Outcome Measure  Help needed turning from your back to your side while in a flat bed without using bedrails?: A Lot Help needed moving from lying on your back to sitting on the side of a flat bed without using bedrails?: Total Help needed moving to and from a bed to a chair (including a wheelchair)?: Total Help needed standing up from a chair using your arms (e.g., wheelchair or bedside chair)?: Total Help needed to walk in hospital room?: Total Help needed climbing 3-5 steps with a railing? : Total 6 Click Score: 7    End of Session Equipment Utilized During Treatment: Gait belt Activity Tolerance: Patient tolerated treatment well Patient left: in chair;with call bell/phone within reach Nurse Communication: Mobility status PT Visit Diagnosis: Unsteadiness on feet (R26.81);Muscle weakness (generalized) (M62.81);Repeated falls (R29.6);Difficulty in walking, not elsewhere classified (R26.2);Other abnormalities of gait and mobility (R26.89);History of falling (Z91.81)     Time: 9604-5409 PT Time Calculation (min) (ACUTE ONLY): 23 min  Charges:    $Therapeutic Activity: 23-37 mins PT General Charges $$ ACUTE PT VISIT: 1 Visit                     Aleda Grana, PT, DPT 12/04/23, 11:50 AM   Sandi Mariscal 12/04/2023, 11:49 AM

## 2023-12-12 ENCOUNTER — Telehealth: Payer: Self-pay

## 2023-12-12 NOTE — Telephone Encounter (Signed)
 Per Dr. Thedore Mins, patient needs clinic appointment before PICC line can be removed and would like to extend OPAT until she is seen.   Patient scheduled for 3/10 with Dr. Elinor Parkinson, coordinated with facility.   Spoke with Jon Gills, Charity fundraiser at [employer]. She requests extension orders be faxed to [phone]. Orders to extend IV antibiotics until 3/10 and to send labs to triage faxed to [facility].   Called patient back to make her aware of plan.   Sandie Ano, RN

## 2023-12-12 NOTE — Telephone Encounter (Signed)
 Patient left voicemail stating her IV antibiotics ended yesterday and would like to know if she needs oral antibiotics.   She is currently at [facility]. She will likely be there until 3/20. Reports the infection in her back is causing paralysis in her legs and weakness in her upper extremities.   [facility]:  [phone]  Sandie Ano, RN

## 2023-12-15 ENCOUNTER — Other Ambulatory Visit: Payer: Self-pay

## 2023-12-15 ENCOUNTER — Telehealth: Payer: Self-pay

## 2023-12-15 ENCOUNTER — Ambulatory Visit (INDEPENDENT_AMBULATORY_CARE_PROVIDER_SITE_OTHER): Admitting: Infectious Diseases

## 2023-12-15 ENCOUNTER — Encounter: Payer: Self-pay | Admitting: Infectious Diseases

## 2023-12-15 VITALS — BP 142/80 | HR 66 | Temp 97.6°F

## 2023-12-15 DIAGNOSIS — M4644 Discitis, unspecified, thoracic region: Secondary | ICD-10-CM

## 2023-12-15 DIAGNOSIS — M4624 Osteomyelitis of vertebra, thoracic region: Secondary | ICD-10-CM | POA: Diagnosis not present

## 2023-12-15 DIAGNOSIS — Z79899 Other long term (current) drug therapy: Secondary | ICD-10-CM

## 2023-12-15 DIAGNOSIS — Z452 Encounter for adjustment and management of vascular access device: Secondary | ICD-10-CM | POA: Diagnosis not present

## 2023-12-15 NOTE — Telephone Encounter (Signed)
 Per Dr. Elinor Parkinson, OPAT extended to 12/30/23. Orders sent back with patient to [facility]. Also contains lab orders for CBC, CMP, CRP, ESR and to call to schedule follow up appointment in 2 weeks.   Sandie Ano, RN

## 2023-12-15 NOTE — Telephone Encounter (Signed)
 Thank you :)

## 2023-12-15 NOTE — Progress Notes (Unsigned)
 No neutral pressure cap present on PICC. Lumen opening wrapped in Tegaderm. Removed Tegaderm and cleaned lumen with CHG. Neutral pressure cap applied, line flushes well and blood return noted.   Labs drawn via PICC line per Dr. Elinor Parkinson. Line flushed with 10 mL normal saline and clamped. Patient tolerated procedure well.   Jill Ano, RN

## 2023-12-15 NOTE — Progress Notes (Unsigned)
 Patient Active Problem List   Diagnosis Date Noted   Falls frequently 11/27/2023   Problem with vascular access 11/26/2023   Diskitis 10/28/2023   Wound infection after surgery 05/14/2022   Delayed surgical wound healing 05/12/2022   Postoperative wound infection 04/08/2022   Scoliosis due to degenerative disease of spine in adult patient 03/30/2022   HNP (herniated nucleus pulposus), cervical 12/08/2020   Lumbago with sciatica, unspecified side 07/28/2020   PICC (peripherally inserted central catheter) in place 03/16/2020   Encounter for long-term (current) use of antibiotics 03/16/2020   Polypharmacy 03/09/2020   At risk for adverse drug event 03/09/2020   MSSA bacteremia 02/29/2020   Postoperative infection 02/28/2020   Abnormal gait due to muscle weakness 02/26/2020   HNP (herniated nucleus pulposus), lumbar 02/16/2020   Muscle weakness of lower extremity 02/14/2020   Spondylolisthesis of lumbar region 10/30/2017   Painful orthopaedic hardware right ankle 05/27/2016   Closed fracture of distal end of right fibula and tibia    Ankle syndesmosis disruption    ASCUS favor benign 01/06/2014   Leg swelling 12/22/2013   Seasonal allergies    Anemia    Chronic back pain due to DJD with history surgery    Hyperthyroidism    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 03/24/2012   Status post left total prosthetic replacement of knee joint using cement 03/24/2012   Fibromyalgia    GERD (gastroesophageal reflux disease)    Anxiety    Depression    Hypertension    Lumbar degenerative disc disease 09/25/2011    Patient's Medications  New Prescriptions   No medications on file  Previous Medications   ACETAMINOPHEN (TYLENOL) 325 MG TABLET    Take 2 tablets (650 mg total) by mouth every 6 (six) hours as needed for mild pain (pain score 1-3) (or Fever >/= 101).   ALBUTEROL (VENTOLIN HFA) 108 (90 BASE) MCG/ACT INHALER    Inhale 2 puffs into the lungs every 6 (six)  hours as needed for wheezing or shortness of breath.   AMLODIPINE (NORVASC) 5 MG TABLET    Take 1 tablet (5 mg total) by mouth daily.   ASCORBIC ACID (VITAMIN C) 500 MG TABLET    Take 500 mg by mouth daily.   ASPIRIN EC 81 MG TABLET    Take 81 mg by mouth in the morning.   ATENOLOL (TENORMIN) 50 MG TABLET    Take 1 tablet (50 mg total) by mouth daily.   CHLORTHALIDONE (HYGROTON) 25 MG TABLET    Take 1 tablet (25 mg total) by mouth daily.   CHLORZOXAZONE (PARAFON) 500 MG TABLET    Take 1 tablet (500 mg total) by mouth 3 (three) times daily.   CHOLECALCIFEROL (VITAMIN D-3 PO)    Take 1 tablet by mouth in the morning.   CYANOCOBALAMIN (VITAMIN B12) 1000 MCG TABLET    Take 1,000 mcg by mouth once a week.   CYCLOBENZAPRINE (FLEXERIL) 10 MG TABLET    Take 10 mg by mouth 3 (three) times daily.   DIAZEPAM (VALIUM) 5 MG TABLET    Take 1 tablet (5 mg total) by mouth 2 (two) times daily.   DICLOFENAC SODIUM (VOLTAREN) 1 % GEL    Apply 2 g topically 4 (four) times daily as needed (pain).   DIPHENHYDRAMINE (BENADRYL) 25 MG TABLET    Take 50 mg by mouth in the morning, at noon, in the evening, and at bedtime.   FERROUS SULFATE 325 (65  FE) MG EC TABLET    Take 325 mg by mouth in the morning.   GABAPENTIN (NEURONTIN) 600 MG TABLET    Take 2 tablets (1,200 mg total) by mouth 3 (three) times daily.   HYDROXYZINE (ATARAX/VISTARIL) 50 MG TABLET    Take 50 mg by mouth 2 (two) times daily as needed for anxiety (sleep).   LUBIPROSTONE (AMITIZA) 24 MCG CAPSULE    Take 1 capsule (24 mcg total) by mouth 2 (two) times daily with a meal.   METHOCARBAMOL (ROBAXIN) 500 MG TABLET    Take 500 mg by mouth 4 (four) times daily.   MULTIPLE MINERALS-VITAMINS (CALCIUM-MAGNESIUM-ZINC-D3 PO)    Take 1 tablet by mouth in the morning, at noon, and at bedtime.   NARCAN 4 MG/0.1ML LIQD NASAL SPRAY KIT    Place 1 spray into the nose once.   OMEPRAZOLE (PRILOSEC) 20 MG CAPSULE    Take 20 mg by mouth in the morning and at bedtime.     ONDANSETRON (ZOFRAN) 4 MG TABLET    Take 1 tablet (4 mg total) by mouth every 6 (six) hours as needed for nausea.   OXYCODONE-ACETAMINOPHEN (PERCOCET) 10-325 MG TABLET    Take 1 tablet by mouth every 6 (six) hours as needed for pain.  Modified Medications   No medications on file  Discontinued Medications   No medications on file    Subjective: Discussed the use of AI scribe software for clinical note transcription with the patient, who gave verbal consent to proceed.  Per prior note from Dr Thedore Mins 63 year old female with past medical history as below presents for hospital follow-up of postop lumbar wound.  She was admitted to [facility] 6/16 - 7/26 for arthrodesis complicated by wound infection.Marland Kitchen  History of MSSA hardware associated lumbar wound infection for which she completed suppressive Keflex in May 2021.  She was admitted for L1-L2 arthrodesis complicated by wound drainage requiring I&D cultures growing Enterobacter cloacae.  On 6/16 she underwent T10-L 3 posterior lateral arthrodesis.  She developed profuse drainage from wound.  [facility] OR on 6/24 for thoracic wound exploration and repair of CSF leak.  She fever on 6/30.  MRI on 7/6 showed fluid collection tracking along left posterior spinal cord from lower T-spine to L4.  Collection crosses midline and lumbar laminectomy space L1-L2 communicated with superficial fluid.  Taken back to the OR on 7/3 for lumbar debridement, noted pus through the length and depth of incision.  Or cultures grew Enterobacter.  She was discharged on cefepime x8 weeks from I&D EOT 8/27.  Patient noted that she had been antibiotics till 9/2. Plan was to place on suppressive antibiotics following completion's of IV therapy.  She does not recall her allergy to Bactrim.   Today 9/8: Patient reports feeling well.  She reports her wound VAC is out.  Denies fevers and chills.  She follows with Dr. Franky Macho. 07/19/22: She thinks bactrim maybe making her firbromylia worse, and  giving her "brain fog." She reports the symptoms started 2 weeks into starting bactrim. She contnues to take it twice a day. She reports her symptoms are tolerable.   Today 01/16/23: Wound has healed.  Reports adherence to antibiotics. Today 12/17 : Discussed the use of AI scribe software for clinical note transcription with the patient, who gave verbal consent to proceed.   History of Present Illness   Pt  reports 'real bad headaches' and a rash on the legs and under the arms for the past few months.  She notes she would like ot stop the bactrim.   Interval events Admitted 1/21-1/25 with numbness in UE and numbness and weakness in the lower extremities. MRI 1/21 Fluid in the disc space at T9-T10, which is new compared to the 04/16/2022 exam, with some increased T2 signal in the endplates at T9-T10. These findings are concerning for discitis/osteomyelitis. No intervention per Neurosurgery. Underwent aspiration by IR off of abtx. Cx with NG. Started on cefepime post op and planned for 6 weeks course through 12/11/23.   Seen in the ED 2/12 for leg swelling with negative xray and doppler study.   Re-admitted 2/19-2/27 with clotted line which was subsequently exchanged. MRI 2/19 with some improved findings.   3/10 IV abtx was extended through 3/10, however it appears IV abtx was stopped on  3/7 am per [facility] and off abtx since then. No concerns with PICC. Lower back pain is 10/10 esp when she moves. She reports she has to call Dr Jackelyn Knife office for fu. She doesnot want to start bactrim back again. Some nausea with cefepime but no vomiting, rashes or diarrhea. She reports she was unable to walk since last admission sine January admission but able to walk with cane and walker prior to that,    Review of Systems: all systems reviewed including MSK and Neurology with pertinent positives and negatives as listed above  Past Medical History:  Diagnosis Date   Anemia    Ankle syndesmosis disruption    Anxiety     takes Ativan and Valium, after mother passed   Arthritis    bilateral knees s/p knee replacement bilaterally   Asthma    Bronchitis    Bruising    pt states unexplained d/t fibromyalgia   Chronic back pain    2012 tailbone surgery and 3 lower discs.    Closed fracture of distal end of right fibula and tibia    Depression    from Fibromyalgia diagnosis; not taking medicine. since 2001   Dizziness    rarely   Fibromyalgia    diagnosed 2001   GERD (gastroesophageal reflux disease)    Prilosec occasionally   Headache(784.0)    "sinus headaches"   History of hiatal hernia    Hypertension    since 2013   Hyperthyroidism    subclinical, no treatment; thyroid nodules   IBS (irritable bowel syndrome)    Impaired memory    states from fibromyalgia   Insomnia    takes Ambien   Jones fracture    left foot fifth metatarsal   Multiple allergies    including latex, pet dander, shellfish, pet dander   Painful orthopaedic hardware right ankle 05/27/2016   Seasonal allergies    Shortness of breath    Occasional with exertion;    Sore gums    this is why pt is on Amoxil-only takes for dental work   Tachycardia    Thyroid goiter    Varicose vein    protrudes above skin-per pt;vein popped and bruised;ultrasound done to make sure that there were no clots;noclots were found   Past Surgical History:  Procedure Laterality Date   ANTERIOR CERVICAL DECOMP/DISCECTOMY FUSION N/A 12/08/2020   Procedure: Cervical Four-Five Anterior cervical decompression/discectomy/fusion;  Surgeon: Coletta Memos, MD;  Location: [facility] OR;  Service: Neurosurgery;  Laterality: N/A;  anterior   ANTERIOR LUMBAR FUSION  09/20/2011   Procedure: ANTERIOR LUMBAR FUSION 1 LEVEL;  Surgeon: Carmela Hurt;  Location: [facility];  Service: Neurosurgery;  Laterality: N/A;  Lumbar five-Sacral One Anterior Lumbar Interbody Fusion /Dr. Early to Approach    APPLICATION OF WOUND VAC N/A 05/14/2022   Procedure: Removal of wound  vac with closure of wound;  Surgeon: Coletta Memos, MD;  Location: [facility] OR;  Service: Neurosurgery;  Laterality: N/A;  Pt to be admitted on 05-12-2022   CERVICAL DISC SURGERY     WITH TITANIUM PLATE IN NECK---LEFT SIDE   CERVICAL SPINE SURGERY  03/24/2019   CESAREAN SECTION     EYE SURGERY Bilateral 2023   cataract   fibroidectomy     FRACTURE SURGERY  05/08/2010   Jones fracture left foot fifth metatarsal   HARDWARE REMOVAL Right 05/28/2016   Procedure: RIGHT ANKLE HARDWARE REMOVAL;  Surgeon: Salvatore Marvel, MD;  Location: [facility];  Service: Orthopedics;  Laterality: Right;   HARDWARE REMOVAL N/A 07/28/2020   Procedure: Removal of Lumbar Hardware;  Surgeon: Coletta Memos, MD;  Location: [facility] OR;  Service: Neurosurgery;  Laterality: N/A;   HERNIA REPAIR     hiatal hernia   IR THORACIC DISC ASPIRATION W/IMG GUIDE  10/29/2023   IRRIGATION AND DEBRIDEMENT KNEE  04/09/2012   Procedure: IRRIGATION AND DEBRIDEMENT KNEE;  Surgeon: Thera Flake., MD;  Location: [facility] OR;  Service: Orthopedics;  Laterality: Right;   JOINT REPLACEMENT  01/27/2012   left total knee and Right total knee   KNEE SURGERY  2005   Left knee arthroscopy   LAMINECTOMY WITH POSTERIOR LATERAL ARTHRODESIS LEVEL 4 Bilateral 03/22/2022   Procedure: Thoracic ten to Lumbar three Posterior lateral arthrodesis with screws;  Surgeon: Coletta Memos, MD;  Location: [facility] OR;  Service: Neurosurgery;  Laterality: Bilateral;   LUMBAR WOUND DEBRIDEMENT N/A 04/08/2022   Procedure: LUMBAR WOUND DEBRIDEMENT;  Surgeon: Coletta Memos, MD;  Location: [facility] OR;  Service: Neurosurgery;  Laterality: N/A;   NASAL SEPTOPLASTY W/ TURBINOPLASTY  2007   due to recurrent sinusitis   ORIF ANKLE FRACTURE Right 06/21/2015   Procedure: OPEN REDUCTION INTERNAL FIXATION RIGHT DISTAL FIBULA  FRACTURE AND OPEN REDUCTION INTERNAL FIXATION SYNDESMOSIS ;  Surgeon: Salvatore Marvel, MD;  Location: [facility];  Service: Orthopedics;  Laterality:  Right;   PARTIAL HYSTERECTOMY     SHOULDER ARTHROSCOPY W/ ROTATOR CUFF REPAIR Right    SHOULDER SURGERY Left    SPINE SURGERY  2004   Cervical plate, ACDF   STERIOD INJECTION  01/27/2012   Procedure: STEROID INJECTION;  Surgeon: Nilda Simmer, MD;  Location: [facility] OR;  Service: Orthopedics;  Laterality: Right;   TEE WITHOUT CARDIOVERSION N/A 03/01/2020   Procedure: TRANSESOPHAGEAL ECHOCARDIOGRAM (TEE);  Surgeon: Jake Bathe, MD;  Location: [facility] ENDOSCOPY;  Service: Cardiovascular;  Laterality: N/A;   THORACIC DISCECTOMY  02/16/2020   Procedure: Thoracic ten-eleven Discectomy;  Surgeon: Coletta Memos, MD;  Location: [facility] OR;  Service: Neurosurgery;;   TONSILLECTOMY  2007   TOTAL KNEE ARTHROPLASTY  01/27/2012   Procedure: TOTAL KNEE ARTHROPLASTY;  Surgeon: Nilda Simmer, MD;  Bilateral   TOTAL KNEE ARTHROPLASTY  04/06/2012   Procedure: TOTAL KNEE ARTHROPLASTY;  Surgeon: Nilda Simmer, MD;  Location: [facility] OR;  Service: Orthopedics;  Laterality: Right;   TUBAL LIGATION     UTERINE FIBROID SURGERY     mid 200s   WOUND EXPLORATION N/A 02/27/2020   Procedure: WOUND EXPLORATION;  Surgeon: Coletta Memos, MD;  Location: [facility] OR;  Service: Neurosurgery;  Laterality: N/A;,  (wound vac upper back)   WOUND EXPLORATION N/A 03/30/2022   Procedure: THORACIC WOUND EXPLORATION WITH  REPAIR OF CSF LEAK;  Surgeon: Coletta Memos, MD;  Location: [facility] OR;  Service: Neurosurgery;  Laterality: N/A;  ' Social History   Tobacco Use   Smoking status: Former    Current packs/day: 0.00    Average packs/day: 0.5 packs/day for 10.0 years (5.0 ttl pk-yrs)    Types: Cigarettes    Start date: 12/05/1988    Quit date: 12/06/1998    Years since quitting: 25.0   Smokeless tobacco: Never  Vaping Use   Vaping status: Never Used  Substance Use Topics   Alcohol use: No   Drug use: No    Family History  Problem Relation Age of Onset   Stroke Father        passed 2004 from pneumonia   Alcohol abuse Father    Pulmonary  embolism Mother        after minor knee surgery leading to DVT   Arthritis Sister    Depression Sister    Hypertension Sister    Diabetes Other        grandmother   Anesthesia problems Neg Hx    Hypotension Neg Hx    Malignant hyperthermia Neg Hx    Pseudochol deficiency Neg Hx     Allergies  Allergen Reactions   Monosodium Glutamate Anaphylaxis and Swelling    Eyes swollen shut, facial swelling, tongue swelling.   Shellfish Allergy Anaphylaxis   Baclofen Nausea Only    Dizziness and increase muscle spasm   Celebrex [Celecoxib] Itching    Only allergic to generic brand   Contrast Media [Iodinated Contrast Media] Itching and Nausea Only    "could not walk"   Diclofenac Itching    Generic Diclefenac gel causes itching. Can take the name brand Voltaren gel   Gadolinium     Allergic to MRI contrast dye per patient.   Other Other (See Comments)    Pet dander,        Latex Itching and Rash    Latex glove with powder; bite blocks used for EGD studies    Health Maintenance  Topic Date Due   COVID-19 Vaccine (1) Never done   Pneumococcal Vaccine 76-36 Years old (1 of 2 - PCV) Never done   Hepatitis C Screening  Never done   Zoster Vaccines- Shingrix (1 of 2) Never done   Colonoscopy  Never done   Cervical Cancer Screening (HPV/Pap Cotest)  12/12/2018   DTaP/Tdap/Td (2 - Tdap) 05/08/2020   Medicare Annual Wellness (AWV)  12/12/2023   MAMMOGRAM  03/03/2025   INFLUENZA VACCINE  Completed   HIV Screening  Completed   HPV VACCINES  Aged Out    Objective:  Vitals:   12/15/23 0939  BP: (!) 142/80  Pulse: 66  Temp: 97.6 F (36.4 C)  TempSrc: Oral  SpO2: 99%   There is no height or weight on file to calculate BMI.  Physical Exam Constitutional:      Appearance: Normal appearance.  HENT:     Head: Normocephalic and atraumatic.      Mouth: Mucous membranes are moist.  Eyes:    Conjunctiva/sclera: Conjunctivae normal.     Pupils: Pupils are equal, round, and  bilaterally symmetrical  Cardiovascular:     Rate and Rhythm: Normal rate and regular rhythm.     Heart sounds:   Pulmonary:     Effort: Pulmonary effort is normal.     Breath sounds: Normal breath sounds.   Abdominal:     General: Non distended  Palpations: soft.   Musculoskeletal:        General: Sitting in a wheelchair.  Bilateral lower extremity weakness  Skin:    General: Skin is warm and dry.     Comments: Posterior back wound healed with no signs of infection   Neurological:     General:    Mental Status: awake, alert and oriented to person, place, and time.   Psychiatric:        Mood and Affect: Mood normal.   Lab Results Lab Results  Component Value Date   WBC 9.3 12/02/2023   HGB 11.9 (L) 12/02/2023   HCT 39.1 12/02/2023   MCV 84.8 12/02/2023   PLT 247 12/02/2023    Lab Results  Component Value Date   CREATININE 0.65 12/02/2023   BUN 22 12/02/2023   NA 136 12/02/2023   K 3.9 12/02/2023   CL 100 12/02/2023   CO2 28 12/02/2023    Lab Results  Component Value Date   ALT 24 11/26/2023   AST 28 11/26/2023   ALKPHOS 112 11/26/2023   BILITOT 0.3 11/26/2023    Lab Results  Component Value Date   CHOL 196 12/30/2013   HDL 70 12/30/2013   LDLCALC 106 (H) 12/30/2013   TRIG 102 12/30/2013   CHOLHDL 2.8 12/30/2013   Lab Results  Component Value Date   LABRPR NON REAC 12/30/2013   No results found for: "HIV1RNAQUANT", "HIV1RNAVL", "CD4TABS"   Assessment/plan # Postop lumbar wound infection with retained hardware culture positive for Enterobacter cloacae - s/p 6 weeks of IV cefepime completed on 06/08/2022> started Bactrim for p.o. suppression which was discontinued per patient preference on 09/23/2023 due to questionable rash related to Bactrim  # History of MSSA hardware associated lumbar wound infection for which she completed suppressive Keflex in May 2021.  # Thoracic discitis/osteomyelitis  -10/29/23 T9-T10 fine-needle disc aspiration, Cx  NGTD - On cefepime, planned for 6 weeks, EOT 3/6, extended to 3/10 but apparently abtx stopped on 3/7 am  Plan - no labs from SNF and will do labs today  - Extend IV abtx for 2 more weeks, EOT 12/30/23  pending labs today. Will consider Ciprofloxacin and cefadroxil as an alternative antibiotics to transition after IV course, seems to be agreeable.  - Fu in 2 weeks around EOT - Discussed to fu with neurosurgery     I have personally spent 42 minutes involved in face-to-face and non-face-to-face activities for this patient on the day of the visit. Professional time spent includes the following activities: Preparing to see the patient (review of tests), Obtaining and/or reviewing separately obtained history (admission/discharge record), Performing a medically appropriate examination and/or evaluation , Ordering medications/tests/procedures, referring and communicating with other health care professionals, Documenting clinical information in the EMR, Independently interpreting results (not separately reported), Communicating results to the patient/family/caregiver, Counseling and educating the patient/family/caregiver and Care coordination (not separately reported).   Of note, portions of this note may have been created with voice recognition software. While this note has been edited for accuracy, occasional wrong-word or 'sound-a-like' substitutions may have occurred due to the inherent limitations of voice recognition software.   Victoriano Lain, MD [facility] 12/15/2023, 9:42 AM

## 2023-12-16 ENCOUNTER — Telehealth: Payer: Self-pay

## 2023-12-16 DIAGNOSIS — M4624 Osteomyelitis of vertebra, thoracic region: Secondary | ICD-10-CM | POA: Insufficient documentation

## 2023-12-16 LAB — CBC
HCT: 42.1 % (ref 35.0–45.0)
Hemoglobin: 13.5 g/dL (ref 11.7–15.5)
MCH: 25.7 pg — ABNORMAL LOW (ref 27.0–33.0)
MCHC: 32.1 g/dL (ref 32.0–36.0)
MCV: 80.2 fL (ref 80.0–100.0)
MPV: 11.7 fL (ref 7.5–12.5)
Platelets: 324 10*3/uL (ref 140–400)
RBC: 5.25 10*6/uL — ABNORMAL HIGH (ref 3.80–5.10)
RDW: 11.9 % (ref 11.0–15.0)
WBC: 11.1 10*3/uL — ABNORMAL HIGH (ref 3.8–10.8)

## 2023-12-16 LAB — COMPREHENSIVE METABOLIC PANEL
AG Ratio: 1.7 (calc) (ref 1.0–2.5)
ALT: 21 U/L (ref 6–29)
AST: 17 U/L (ref 10–35)
Albumin: 4.2 g/dL (ref 3.6–5.1)
Alkaline phosphatase (APISO): 145 U/L (ref 37–153)
BUN: 16 mg/dL (ref 7–25)
CO2: 31 mmol/L (ref 20–32)
Calcium: 10.3 mg/dL (ref 8.6–10.4)
Chloride: 96 mmol/L — ABNORMAL LOW (ref 98–110)
Creat: 0.51 mg/dL (ref 0.50–1.05)
Globulin: 2.5 g/dL (ref 1.9–3.7)
Glucose, Bld: 112 mg/dL — ABNORMAL HIGH (ref 65–99)
Potassium: 3 mmol/L — ABNORMAL LOW (ref 3.5–5.3)
Sodium: 140 mmol/L (ref 135–146)
Total Bilirubin: 0.2 mg/dL (ref 0.2–1.2)
Total Protein: 6.7 g/dL (ref 6.1–8.1)

## 2023-12-16 LAB — C-REACTIVE PROTEIN: CRP: 12 mg/L — ABNORMAL HIGH (ref <8.0)

## 2023-12-16 LAB — SEDIMENTATION RATE: Sed Rate: 28 mm/h (ref 0–30)

## 2023-12-16 NOTE — Telephone Encounter (Addendum)
 Order faxed to Christella Noa [phone]) at [facility]  for patient to start oral abx after completion of IV abx on 12/30/23 and for the patient to take  KCL x 1 for hypokalemia  per Dr. Elinor Parkinson.   Kimerly Rowand Jonathon Resides, CMA

## 2023-12-16 NOTE — Progress Notes (Signed)
 Thank you for faxing the oral antibiotic instructions as well - appreciate it!

## 2023-12-16 NOTE — Telephone Encounter (Signed)
-----   Message from Jennette Kettle sent at 12/16/2023  1:08 PM EDT ----- That makes sense about oral antibiotics - since triage will already reach out about PICC line and potassium replacement, you can go ahead and inform them about the oral antibiotic plan as well. Thank you guys!

## 2023-12-16 NOTE — Progress Notes (Signed)
 That makes sense about oral antibiotics - since triage will already reach out about PICC line and potassium replacement, you can go ahead and inform them about the oral antibiotic plan as well. Thank you guys!

## 2023-12-16 NOTE — Progress Notes (Signed)
 Thank you for letting us know Dr. Elinor Parkinson - we will have triage reach out to the SNF about the potassium and PICC plans. Did you want someone to go ahead and send in ciprofloxacin and cefdraoxil ?

## 2023-12-16 NOTE — Telephone Encounter (Signed)
 error

## 2023-12-18 NOTE — Telephone Encounter (Addendum)
 Received call from [employer], Associate Professor at [facility], wanting to clarify orders. She was wanting to know if patient is to go ahead and start oral antibiotics.   Discussed that per Dr. Elinor Parkinson, patient is to continue with IV cefepime until 12/30/23, at which time PICC can be removed after last dose. Patient will then start oral ciprofloxacin and cefadroxil after completion of IV cefepime. Also confirmed that they received orders for KCL 40 meq once.   Sandie Ano, RN

## 2023-12-23 ENCOUNTER — Telehealth: Payer: Self-pay

## 2023-12-23 NOTE — Telephone Encounter (Signed)
 Received the following message from Jeri Modena, RN with [employer]:    Jamesetta So, RN; Jennette Kettle, RPH-CPP; Tressa Busman T, CMA Hello all. Just letting you know we are  taking Ms. Neddo home tomorrow from [facility] on Cefepime through 12/30/23 is the new end date I saw in your notes.  We are working on an updated OPAT from the SNF now.  Just keeping you in the loop.  Pam

## 2023-12-29 ENCOUNTER — Telehealth: Payer: Self-pay | Admitting: Internal Medicine

## 2023-12-29 ENCOUNTER — Telehealth: Payer: Self-pay

## 2023-12-29 DIAGNOSIS — Z452 Encounter for adjustment and management of vascular access device: Secondary | ICD-10-CM

## 2023-12-29 MED ORDER — CEFADROXIL 500 MG PO CAPS: 1000.0000 mg | ORAL_CAPSULE | Freq: Two times a day (BID) | ORAL | 0 refills | Status: DC

## 2023-12-29 MED ORDER — CIPROFLOXACIN HCL 500 MG PO TABS: 500.0000 mg | ORAL_TABLET | Freq: Two times a day (BID) | ORAL | 0 refills | Status: DC

## 2023-12-29 NOTE — Telephone Encounter (Addendum)
-  Pt's picc line came out, original EOT 3/25 with plan for PO transition. Last set of labs on 3/10 esr/crp 28/12 wbc 11k. Pt's picc line was out, subsequently pulled out.  -Spoek Dr. Franky Macho today, there may be a procedure that he would like pt to  be on  V abx, plans to get with me if that is the case.  -Will plan to extend IV abx till 3/31 till f/u with myself. Please place picc  OPAT Orders Discharge antibiotics to be given via PICC line Discharge antibiotics: Cefepime 2 g IV every 8 hours  End Date: 01/05/24   [facility] Care Per Protocol: Yes   Home health RN for IV administration and teaching; PICC line care and labs.     Labs weekly while on IV antibiotics: __x CBC with differential __ BMP _x_ CMP _x_ CRP _x_ ESR __ Vancomycin trough __ CK   __ Please pull PIC at completion of IV antibiotics _x_ Please leave PIC in place until doctor has seen patient or been notified   Fax weekly labs to [phone]   Clinic Follow Up Appt: 3/31

## 2023-12-29 NOTE — Addendum Note (Signed)
 Addended by: Juanita Laster on: 12/29/2023 10:59 AM   Modules accepted: Orders

## 2023-12-29 NOTE — Telephone Encounter (Signed)
 Received call today from Vernona Rieger, RN with [facility] stating picc line was out 14 cm during home visit. RN pulled picc and was not sure if provider would need to order replacement. Per last note end date is 3/25.  Per Dr. Thedore Mins okay to transition to oral antibiotics. ciprofloxacin 500mg  po bid and cefadroxil 1g po bid. HH not able to get updated labs.  Will update pt that she will need to start oral antibiotics.  Juanita Laster, RMA

## 2023-12-29 NOTE — Telephone Encounter (Signed)
 Per Dr. Thedore Mins pt will need picc replaced. Will enter updated OPAT orders. PT scheduled with [facility] at 2 pm. Understands she will need to arrive by 145 at [facility] admissions desk to check in. Community message forwarded to [employer] team.   Juanita Laster, RMA

## 2023-12-29 NOTE — Telephone Encounter (Signed)
 Spoke with patient and her son regarding plan to transition to oral antibiotics. Patient states that Dr. Franky Macho is working with pt on procedure to drain back to relive pressure. Dr. Franky Macho stated to patient that he wanted her on IV antibiotics while doing this.  Messaged Dr. Thedore Mins to reach out to Dr. Franky Macho to discuss this.  Will update patient once more information available.  Juanita Laster, RMA

## 2023-12-30 ENCOUNTER — Observation Stay (HOSPITAL_COMMUNITY)
Admission: EM | Admit: 2023-12-30 | Discharge: 2023-12-31 | Disposition: A | Discharge status: Home / Self Care | Attending: Family Medicine | Admitting: Family Medicine

## 2023-12-30 ENCOUNTER — Other Ambulatory Visit: Payer: Self-pay

## 2023-12-30 ENCOUNTER — Ambulatory Visit (HOSPITAL_COMMUNITY)
Admission: RE | Admit: 2023-12-30 | Discharge: 2023-12-30 | Disposition: A | Discharge status: Home / Self Care | Source: Ambulatory Visit | Attending: Internal Medicine | Admitting: Internal Medicine

## 2023-12-30 ENCOUNTER — Encounter (HOSPITAL_COMMUNITY): Payer: Self-pay

## 2023-12-30 DIAGNOSIS — M4644 Discitis, unspecified, thoracic region: Secondary | ICD-10-CM | POA: Diagnosis not present

## 2023-12-30 DIAGNOSIS — G8929 Other chronic pain: Secondary | ICD-10-CM | POA: Insufficient documentation

## 2023-12-30 DIAGNOSIS — M170 Bilateral primary osteoarthritis of knee: Secondary | ICD-10-CM | POA: Diagnosis not present

## 2023-12-30 DIAGNOSIS — Z789 Other specified health status: Secondary | ICD-10-CM | POA: Diagnosis present

## 2023-12-30 DIAGNOSIS — Z79899 Other long term (current) drug therapy: Secondary | ICD-10-CM | POA: Insufficient documentation

## 2023-12-30 DIAGNOSIS — T82524A Displacement of infusion catheter, initial encounter: Secondary | ICD-10-CM | POA: Diagnosis present

## 2023-12-30 DIAGNOSIS — Z7982 Long term (current) use of aspirin: Secondary | ICD-10-CM | POA: Diagnosis not present

## 2023-12-30 DIAGNOSIS — Z91040 Latex allergy status: Secondary | ICD-10-CM | POA: Diagnosis not present

## 2023-12-30 DIAGNOSIS — M4640 Discitis, unspecified, site unspecified: Secondary | ICD-10-CM | POA: Diagnosis present

## 2023-12-30 DIAGNOSIS — Z792 Long term (current) use of antibiotics: Secondary | ICD-10-CM | POA: Diagnosis not present

## 2023-12-30 DIAGNOSIS — F32A Depression, unspecified: Secondary | ICD-10-CM | POA: Diagnosis not present

## 2023-12-30 DIAGNOSIS — F419 Anxiety disorder, unspecified: Secondary | ICD-10-CM | POA: Insufficient documentation

## 2023-12-30 DIAGNOSIS — Z96653 Presence of artificial knee joint, bilateral: Secondary | ICD-10-CM | POA: Insufficient documentation

## 2023-12-30 DIAGNOSIS — T829XXA Unspecified complication of cardiac and vascular prosthetic device, implant and graft, initial encounter: Principal | ICD-10-CM

## 2023-12-30 DIAGNOSIS — E876 Hypokalemia: Secondary | ICD-10-CM | POA: Diagnosis not present

## 2023-12-30 DIAGNOSIS — M1711 Unilateral primary osteoarthritis, right knee: Secondary | ICD-10-CM | POA: Diagnosis present

## 2023-12-30 DIAGNOSIS — Z6838 Body mass index (BMI) 38.0-38.9, adult: Secondary | ICD-10-CM | POA: Insufficient documentation

## 2023-12-30 DIAGNOSIS — F341 Dysthymic disorder: Secondary | ICD-10-CM | POA: Diagnosis not present

## 2023-12-30 DIAGNOSIS — E66813 Obesity, class 3: Secondary | ICD-10-CM | POA: Insufficient documentation

## 2023-12-30 DIAGNOSIS — Z9229 Personal history of other drug therapy: Secondary | ICD-10-CM

## 2023-12-30 DIAGNOSIS — M797 Fibromyalgia: Secondary | ICD-10-CM | POA: Insufficient documentation

## 2023-12-30 DIAGNOSIS — J45909 Unspecified asthma, uncomplicated: Secondary | ICD-10-CM | POA: Diagnosis not present

## 2023-12-30 DIAGNOSIS — I10 Essential (primary) hypertension: Secondary | ICD-10-CM | POA: Diagnosis present

## 2023-12-30 DIAGNOSIS — Z87891 Personal history of nicotine dependence: Secondary | ICD-10-CM | POA: Diagnosis not present

## 2023-12-30 LAB — COMPREHENSIVE METABOLIC PANEL
ALT: 22 U/L (ref 0–44)
AST: 22 U/L (ref 15–41)
Albumin: 3.4 g/dL — ABNORMAL LOW (ref 3.5–5.0)
Alkaline Phosphatase: 115 U/L (ref 38–126)
Anion gap: 13 (ref 5–15)
BUN: 11 mg/dL (ref 8–23)
CO2: 30 mmol/L (ref 22–32)
Calcium: 9.9 mg/dL (ref 8.9–10.3)
Chloride: 100 mmol/L (ref 98–111)
Creatinine, Ser: 0.59 mg/dL (ref 0.44–1.00)
GFR, Estimated: >60 mL/min (ref >60)
Glucose, Bld: 116 mg/dL — ABNORMAL HIGH (ref 70–99)
Potassium: 2.6 mmol/L — CL (ref 3.5–5.1)
Sodium: 143 mmol/L (ref 135–145)
Total Bilirubin: 0.2 mg/dL (ref 0.0–1.2)
Total Protein: 6.5 g/dL (ref 6.5–8.1)

## 2023-12-30 LAB — CBC WITH DIFFERENTIAL/PLATELET
Abs Immature Granulocytes: 0.03 10*3/uL (ref 0.00–0.07)
Basophils Absolute: 0.1 10*3/uL (ref 0.0–0.1)
Basophils Relative: 1 %
Eosinophils Absolute: 0.6 10*3/uL — ABNORMAL HIGH (ref 0.0–0.5)
Eosinophils Relative: 7 %
HCT: 43.1 % (ref 36.0–46.0)
Hemoglobin: 13.2 g/dL (ref 12.0–15.0)
Immature Granulocytes: 0 %
Lymphocytes Relative: 23 %
Lymphs Abs: 2.2 10*3/uL (ref 0.7–4.0)
MCH: 25.3 pg — ABNORMAL LOW (ref 26.0–34.0)
MCHC: 30.6 g/dL (ref 30.0–36.0)
MCV: 82.6 fL (ref 80.0–100.0)
Monocytes Absolute: 0.8 10*3/uL (ref 0.1–1.0)
Monocytes Relative: 9 %
Neutro Abs: 5.6 10*3/uL (ref 1.7–7.7)
Neutrophils Relative %: 60 %
Platelets: 296 10*3/uL (ref 150–400)
RBC: 5.22 MIL/uL — ABNORMAL HIGH (ref 3.87–5.11)
RDW: 13.2 % (ref 11.5–15.5)
WBC: 9.3 10*3/uL (ref 4.0–10.5)
nRBC: 0 % (ref 0.0–0.2)

## 2023-12-30 LAB — POTASSIUM: Potassium: 2.7 mmol/L — CL (ref 3.5–5.1)

## 2023-12-30 LAB — MAGNESIUM: Magnesium: 2.1 mg/dL (ref 1.7–2.4)

## 2023-12-30 MED ORDER — ATENOLOL 50 MG PO TABS
50.0000 mg | ORAL_TABLET | Freq: Every day | ORAL | Status: DC
Start: 2023-12-31 — End: 2023-12-31
  Administered 2023-12-31: 50 mg via ORAL
  Filled 2023-12-30: qty 1

## 2023-12-30 MED ORDER — DIAZEPAM 5 MG PO TABS
5.0000 mg | ORAL_TABLET | Freq: Two times a day (BID) | ORAL | Status: DC
Start: 2023-12-30 — End: 2023-12-31
  Administered 2023-12-30 – 2023-12-31 (×2): 5 mg via ORAL
  Filled 2023-12-30 (×2): qty 1

## 2023-12-30 MED ORDER — ASPIRIN 81 MG PO TBEC
81.0000 mg | DELAYED_RELEASE_TABLET | Freq: Every morning | ORAL | Status: DC
  Administered 2023-12-31: 81 mg via ORAL
  Filled 2023-12-30: qty 1

## 2023-12-30 MED ORDER — ACETAMINOPHEN 325 MG PO TABS
325.0000 mg | ORAL_TABLET | Freq: Four times a day (QID) | ORAL | Status: DC | PRN
  Administered 2023-12-31: 325 mg via ORAL
  Filled 2023-12-30: qty 1

## 2023-12-30 MED ORDER — LUBIPROSTONE 24 MCG PO CAPS
48.0000 ug | ORAL_CAPSULE | Freq: Every day | ORAL | Status: DC
  Administered 2023-12-31: 48 ug via ORAL
  Filled 2023-12-30: qty 2

## 2023-12-30 MED ORDER — OXYCODONE-ACETAMINOPHEN 10-325 MG PO TABS: 1.0000 | ORAL_TABLET | Freq: Four times a day (QID) | ORAL | Status: DC | PRN

## 2023-12-30 MED ORDER — AMLODIPINE BESYLATE 5 MG PO TABS
5.0000 mg | ORAL_TABLET | Freq: Every day | ORAL | Status: DC
Start: 2023-12-31 — End: 2023-12-31
  Administered 2023-12-31: 5 mg via ORAL
  Filled 2023-12-30: qty 1

## 2023-12-30 MED ORDER — ACETAMINOPHEN 325 MG PO TABS: 650.0000 mg | ORAL_TABLET | Freq: Four times a day (QID) | ORAL | Status: DC | PRN

## 2023-12-30 MED ORDER — DICLOFENAC SODIUM 1 % EX GEL: 2.0000 g | Freq: Four times a day (QID) | CUTANEOUS | Status: DC | PRN

## 2023-12-30 MED ORDER — OXYCODONE HCL 5 MG PO TABS
10.0000 mg | ORAL_TABLET | Freq: Four times a day (QID) | ORAL | Status: DC | PRN
  Administered 2023-12-30 – 2023-12-31 (×2): 10 mg via ORAL
  Filled 2023-12-30 (×2): qty 2

## 2023-12-30 MED ORDER — GABAPENTIN 300 MG PO CAPS
1200.0000 mg | ORAL_CAPSULE | Freq: Three times a day (TID) | ORAL | Status: DC
Start: 2023-12-30 — End: 2023-12-31
  Administered 2023-12-30: 1200 mg via ORAL
  Filled 2023-12-30 (×2): qty 4

## 2023-12-30 MED ORDER — METHOCARBAMOL 500 MG PO TABS
500.0000 mg | ORAL_TABLET | Freq: Three times a day (TID) | ORAL | Status: DC
  Administered 2023-12-30 – 2023-12-31 (×2): 500 mg via ORAL
  Filled 2023-12-30 (×3): qty 1

## 2023-12-30 MED ORDER — ACETAMINOPHEN 650 MG RE SUPP
650.0000 mg | Freq: Four times a day (QID) | RECTAL | Status: DC | PRN
Start: 2023-12-30 — End: 2023-12-31

## 2023-12-30 MED ORDER — ONDANSETRON HCL 4 MG PO TABS: 4.0000 mg | ORAL_TABLET | Freq: Four times a day (QID) | ORAL | Status: DC | PRN

## 2023-12-30 MED ORDER — POTASSIUM CHLORIDE 10 MEQ/100ML IV SOLN
10.0000 meq | INTRAVENOUS | Status: DC
  Administered 2023-12-31: 10 meq via INTRAVENOUS
  Filled 2023-12-30 (×2): qty 100

## 2023-12-30 MED ORDER — SODIUM CHLORIDE 0.9 % IV SOLN
2.0000 g | Freq: Three times a day (TID) | INTRAVENOUS | Status: DC
  Administered 2023-12-30 – 2023-12-31 (×3): 2 g via INTRAVENOUS
  Filled 2023-12-30 (×3): qty 12.5

## 2023-12-30 MED ORDER — POTASSIUM CHLORIDE CRYS ER 20 MEQ PO TBCR
40.0000 meq | EXTENDED_RELEASE_TABLET | Freq: Once | ORAL | Status: AC
  Administered 2023-12-30: 40 meq via ORAL
  Filled 2023-12-30: qty 2

## 2023-12-30 MED ORDER — CHLORZOXAZONE 500 MG PO TABS
500.0000 mg | ORAL_TABLET | Freq: Three times a day (TID) | ORAL | Status: DC
  Administered 2023-12-30 – 2023-12-31 (×2): 500 mg via ORAL
  Filled 2023-12-30 (×4): qty 1

## 2023-12-30 MED ORDER — ALBUTEROL SULFATE (2.5 MG/3ML) 0.083% IN NEBU: 3.0000 mL | INHALATION_SOLUTION | Freq: Four times a day (QID) | RESPIRATORY_TRACT | Status: DC | PRN

## 2023-12-30 MED ORDER — HYDROMORPHONE HCL 1 MG/ML IJ SOLN
0.5000 mg | INTRAMUSCULAR | Status: DC | PRN
  Filled 2023-12-30: qty 0.5

## 2023-12-30 MED ORDER — ONDANSETRON HCL 4 MG/2ML IJ SOLN
4.0000 mg | Freq: Four times a day (QID) | INTRAMUSCULAR | Status: DC | PRN
  Administered 2023-12-31: 4 mg via INTRAVENOUS
  Filled 2023-12-30: qty 2

## 2023-12-30 MED ORDER — MORPHINE SULFATE (PF) 2 MG/ML IV SOLN
1.0000 mg | Freq: Once | INTRAVENOUS | Status: AC
  Administered 2023-12-30: 1 mg via INTRAVENOUS
  Filled 2023-12-30: qty 1

## 2023-12-30 NOTE — Progress Notes (Signed)
 [facility] night cross cover note:   Per patient request, I have resumed her outpatient scheduled Robaxin 500 mg po q6H, which she conveys that she takes in tandem with scheduled Parafon at home.     Belinsky Pigg, DO Hospitalist

## 2023-12-30 NOTE — H&P (Signed)
 History and Physical    Patient: [name] ZOX:[id number] DOB: [birth year] DOA: 12/30/2023 DOS: the patient was seen and examined on 12/30/2023 PCP: Irven Coe, MD  Patient coming from: Home  Chief Complaint:  Chief Complaint  Patient presents with   PICC Line came out    HPI: [name] is a 63 y.o. female with medical history significant for T9 discitis/osteomyelitis and T9 epidural phlegmon/abscess s/p aspiration 10/29/2023 on home IV cefepime, recently extended to 3/31, followed outpatient by neurosurgery and infectious diseases, being admitted for PICC line replacement by IR following accidental dislodgment on 3/24, causing patient to miss 5 doses of her cefepime.  Patient has a history of scoliosis with multiple prior spinal surgeries, prior MSSA and Enterobacter cloacae spinal hardware infection, for which she was on cefepime.  She was recently hospitalized from 2/19 to 2/27 with a clotted PICC line.  On 3/24, her home nurse noticed that the line was 14 cm out t and they proceeded to remove the PICC line entirely.   She missed an appointment for PICC line replacement today by IR today and the decided to present to the ED.  She denies complaints except for her ongoing back pain related to the discitis.  She has had no fever or chills, vomiting or nausea, cough, chest pain or shortness of breath or palpitations. ED course and data review: Vitals within normal limits.  Labs notable for potassium of 2.6 for which oral repletion was initiated in the ED The ED provider spoke with IR, Dr. Richarda Overlie who will take patient for PICC line replacement in the a.m. In the interim patient started on cefepime IV per prior dosing at 2 g IV 3 times daily. Hospitalist consulted for admission.   Review of Systems: As mentioned in the history of present illness. All other systems reviewed and are negative.  Past Medical History:  Diagnosis Date   Anemia    Ankle syndesmosis disruption    Anxiety     takes Ativan and Valium, after mother passed   Arthritis    bilateral knees s/p knee replacement bilaterally   Asthma    Bronchitis    Bruising    pt states unexplained d/t fibromyalgia   Chronic back pain    2012 tailbone surgery and 3 lower discs.    Closed fracture of distal end of right fibula and tibia    Depression    from Fibromyalgia diagnosis; not taking medicine. since 2001   Dizziness    rarely   Fibromyalgia    diagnosed 2001   GERD (gastroesophageal reflux disease)    Prilosec occasionally   Headache(784.0)    "sinus headaches"   History of hiatal hernia    Hypertension    since 2013   Hyperthyroidism    subclinical, no treatment; thyroid nodules   IBS (irritable bowel syndrome)    Impaired memory    states from fibromyalgia   Insomnia    takes Ambien   Jones fracture    left foot fifth metatarsal   Multiple allergies    including latex, pet dander, shellfish, pet dander   Painful orthopaedic hardware right ankle 05/27/2016   Seasonal allergies    Shortness of breath    Occasional with exertion;    Sore gums    this is why pt is on Amoxil-only takes for dental work   Tachycardia    Thyroid goiter    Varicose vein    protrudes above skin-per pt;vein popped and bruised;ultrasound  done to make sure that there were no clots;noclots were found   Past Surgical History:  Procedure Laterality Date   ANTERIOR CERVICAL DECOMP/DISCECTOMY FUSION N/A 12/08/2020   Procedure: Cervical Four-Five Anterior cervical decompression/discectomy/fusion;  Surgeon: Coletta Memos, MD;  Location: [facility] OR;  Service: Neurosurgery;  Laterality: N/A;  anterior   ANTERIOR LUMBAR FUSION  09/20/2011   Procedure: ANTERIOR LUMBAR FUSION 1 LEVEL;  Surgeon: Carmela Hurt;  Location: [facility]URO ORS;  Service: Neurosurgery;  Laterality: N/A;  Lumbar five-Sacral One Anterior Lumbar Interbody Fusion /Dr. Early to Approach    APPLICATION OF WOUND VAC N/A 05/14/2022   Procedure: Removal of wound  vac with closure of wound;  Surgeon: Coletta Memos, MD;  Location: [facility] OR;  Service: Neurosurgery;  Laterality: N/A;  Pt to be admitted on 05-12-2022   CERVICAL DISC SURGERY     WITH TITANIUM PLATE IN NECK---LEFT SIDE   CERVICAL SPINE SURGERY  03/24/2019   CESAREAN SECTION     EYE SURGERY Bilateral 2023   cataract   fibroidectomy     FRACTURE SURGERY  05/08/2010   Jones fracture left foot fifth metatarsal   HARDWARE REMOVAL Right 05/28/2016   Procedure: RIGHT ANKLE HARDWARE REMOVAL;  Surgeon: Salvatore Marvel, MD;  Location: [facility];  Service: Orthopedics;  Laterality: Right;   HARDWARE REMOVAL N/A 07/28/2020   Procedure: Removal of Lumbar Hardware;  Surgeon: Coletta Memos, MD;  Location: [facility] OR;  Service: Neurosurgery;  Laterality: N/A;   HERNIA REPAIR     hiatal hernia   IR THORACIC DISC ASPIRATION W/IMG GUIDE  10/29/2023   IRRIGATION AND DEBRIDEMENT KNEE  04/09/2012   Procedure: IRRIGATION AND DEBRIDEMENT KNEE;  Surgeon: Thera Flake., MD;  Location: [facility] OR;  Service: Orthopedics;  Laterality: Right;   JOINT REPLACEMENT  01/27/2012   left total knee and Right total knee   KNEE SURGERY  2005   Left knee arthroscopy   LAMINECTOMY WITH POSTERIOR LATERAL ARTHRODESIS LEVEL 4 Bilateral 03/22/2022   Procedure: Thoracic ten to Lumbar three Posterior lateral arthrodesis with screws;  Surgeon: Coletta Memos, MD;  Location: [facility] OR;  Service: Neurosurgery;  Laterality: Bilateral;   LUMBAR WOUND DEBRIDEMENT N/A 04/08/2022   Procedure: LUMBAR WOUND DEBRIDEMENT;  Surgeon: Coletta Memos, MD;  Location: [facility] OR;  Service: Neurosurgery;  Laterality: N/A;   NASAL SEPTOPLASTY W/ TURBINOPLASTY  2007   due to recurrent sinusitis   ORIF ANKLE FRACTURE Right 06/21/2015   Procedure: OPEN REDUCTION INTERNAL FIXATION RIGHT DISTAL FIBULA  FRACTURE AND OPEN REDUCTION INTERNAL FIXATION SYNDESMOSIS ;  Surgeon: Salvatore Marvel, MD;  Location: [facility];  Service: Orthopedics;  Laterality:  Right;   PARTIAL HYSTERECTOMY     SHOULDER ARTHROSCOPY W/ ROTATOR CUFF REPAIR Right    SHOULDER SURGERY Left    SPINE SURGERY  2004   Cervical plate, ACDF   STERIOD INJECTION  01/27/2012   Procedure: STEROID INJECTION;  Surgeon: Nilda Simmer, MD;  Location: [facility] OR;  Service: Orthopedics;  Laterality: Right;   TEE WITHOUT CARDIOVERSION N/A 03/01/2020   Procedure: TRANSESOPHAGEAL ECHOCARDIOGRAM (TEE);  Surgeon: Jake Bathe, MD;  Location: [facility] ENDOSCOPY;  Service: Cardiovascular;  Laterality: N/A;   THORACIC DISCECTOMY  02/16/2020   Procedure: Thoracic ten-eleven Discectomy;  Surgeon: Coletta Memos, MD;  Location: [facility] OR;  Service: Neurosurgery;;   TONSILLECTOMY  2007   TOTAL KNEE ARTHROPLASTY  01/27/2012   Procedure: TOTAL KNEE ARTHROPLASTY;  Surgeon: Nilda Simmer, MD;  Bilateral   TOTAL KNEE ARTHROPLASTY  04/06/2012   Procedure: TOTAL KNEE ARTHROPLASTY;  Surgeon: Nilda Simmer, MD;  Location: [facility] OR;  Service: Orthopedics;  Laterality: Right;   TUBAL LIGATION     UTERINE FIBROID SURGERY     mid 200s   WOUND EXPLORATION N/A 02/27/2020   Procedure: WOUND EXPLORATION;  Surgeon: Coletta Memos, MD;  Location: [facility] OR;  Service: Neurosurgery;  Laterality: N/A;,  (wound vac upper back)   WOUND EXPLORATION N/A 03/30/2022   Procedure: THORACIC WOUND EXPLORATION WITH REPAIR OF CSF LEAK;  Surgeon: Coletta Memos, MD;  Location: [facility] OR;  Service: Neurosurgery;  Laterality: N/A;   Social History:  reports that she quit smoking about 25 years ago. Her smoking use included cigarettes. She started smoking about 35 years ago. She has a 5 pack-year smoking history. She has never used smokeless tobacco. She reports that she does not drink alcohol and does not use drugs.  Allergies  Allergen Reactions   Monosodium Glutamate Anaphylaxis and Swelling    Eyes swollen shut, facial swelling, tongue swelling.   Shellfish Allergy Anaphylaxis   Baclofen Nausea Only    Dizziness and increase muscle spasm    Celebrex [Celecoxib] Itching    Only allergic to generic brand   Contrast Media [Iodinated Contrast Media] Itching and Nausea Only    "could not walk"   Diclofenac Itching    Generic Diclefenac gel causes itching. Can take the name brand Voltaren gel   Gadolinium     Allergic to MRI contrast dye per patient.   Other Other (See Comments)    Pet dander,        Latex Itching and Rash    Latex glove with powder; bite blocks used for EGD studies    Family History  Problem Relation Age of Onset   Stroke Father        passed 2004 from pneumonia   Alcohol abuse Father    Pulmonary embolism Mother        after minor knee surgery leading to DVT   Arthritis Sister    Depression Sister    Hypertension Sister    Diabetes Other        grandmother   Anesthesia problems Neg Hx    Hypotension Neg Hx    Malignant hyperthermia Neg Hx    Pseudochol deficiency Neg Hx     Prior to Admission medications   Medication Sig Start Date End Date Taking? Authorizing Provider  Acetaminophen (TYLENOL PO) Take 1 tablet by mouth as needed.   Yes [provider]  albuterol (VENTOLIN HFA) 108 (90 Base) MCG/ACT inhaler Inhale 2 puffs into the lungs every 6 (six) hours as needed for wheezing or shortness of breath. 03/24/20  Yes Medina-Vargas, Monina C, NP  amLODipine (NORVASC) 5 MG tablet Take 1 tablet (5 mg total) by mouth daily. Patient taking differently: Take 5 mg by mouth in the morning. 12/04/23  Yes Mdala-Gausi, Gwenette Greet, MD  ascorbic acid (VITAMIN C) 500 MG tablet Take 500 mg by mouth in the morning.   Yes [provider]  aspirin EC 81 MG tablet Take 81 mg by mouth in the morning.   Yes [provider]  atenolol (TENORMIN) 50 MG tablet Take 1 tablet (50 mg total) by mouth daily. Patient taking differently: Take 50 mg by mouth in the morning. 03/24/20  Yes Medina-Vargas, Monina C, NP  chlorthalidone (HYGROTON) 25 MG tablet Take 1 tablet (25 mg total) by mouth  daily. Patient taking differently: Take 25 mg by mouth  in the morning. 12/04/23  Yes Mdala-Gausi, Gwenette Greet, MD  chlorzoxazone (PARAFON) 500 MG tablet Take 1 tablet (500 mg total) by mouth 3 (three) times daily. Patient taking differently: Take 500 mg by mouth 4 (four) times daily. 03/24/20  Yes Medina-Vargas, Monina C, NP  Cholecalciferol (VITAMIN D-3 PO) Take 1 tablet by mouth in the morning.   Yes [provider]  cyanocobalamin (VITAMIN B12) 1000 MCG tablet Take 1,000 mcg by mouth once a week.   Yes [provider]  cyclobenzaprine (FLEXERIL) 10 MG tablet Take 10 mg by mouth 3 (three) times daily.   Yes [provider]  diazepam (VALIUM) 5 MG tablet Take 1 tablet (5 mg total) by mouth 2 (two) times daily. Patient taking differently: Take 5 mg by mouth See admin instructions. Take 5 mg at bedtime and additional 5 mg if needed for anxiety 03/22/20  Yes Medina-Vargas, Monina C, NP  diclofenac Sodium (VOLTAREN) 1 % GEL Apply 2 g topically 4 (four) times daily as needed (pain).   Yes [provider]  diphenhydrAMINE (BENADRYL) 25 MG tablet Take 50 mg by mouth in the morning, at noon, in the evening, and at bedtime.   Yes [provider]  ferrous sulfate 325 (65 FE) MG EC tablet Take 325 mg by mouth in the morning.   Yes [provider]  gabapentin (NEURONTIN) 600 MG tablet Take 2 tablets (1,200 mg total) by mouth 3 (three) times daily. 03/24/20  Yes Medina-Vargas, Monina C, NP  lubiprostone (AMITIZA) 24 MCG capsule Take 1 capsule (24 mcg total) by mouth 2 (two) times daily with a meal. Patient taking differently: Take 48 mcg by mouth daily before lunch. 03/24/20  Yes Medina-Vargas, Monina C, NP  methocarbamol (ROBAXIN) 500 MG tablet Take 500 mg by mouth 4 (four) times daily.   Yes [provider]  Multiple Minerals-Vitamins (CALCIUM-MAGNESIUM-ZINC-D3 PO) Take 1 tablet by mouth in the morning, at noon, and at bedtime.   Yes [provider]  NARCAN 4 MG/0.1ML LIQD nasal spray kit Place 1 spray into the nose once. Patient taking differently: Place 1 spray into the nose once as needed (opioid overdose). 05/26/20  Yes [provider]  omeprazole (PRILOSEC) 20 MG capsule Take 20 mg by mouth in the morning and at bedtime.    Yes [provider]  ondansetron (ZOFRAN-ODT) 4 MG disintegrating tablet Take 4 mg by mouth every 6 (six) hours as needed. 12/25/23  Yes [provider]  oxyCODONE-acetaminophen (PERCOCET) 10-325 MG tablet Take 1 tablet by mouth every 6 (six) hours as needed for pain. 12/04/23  Yes Mdala-Gausi, Gwenette Greet, MD  cefadroxil (DURICEF) 500 MG capsule Take 2 capsules (1,000 mg total) by mouth 2 (two) times daily. 12/29/23 01/28/24  Danelle Earthly, MD  celecoxib (CELEBREX) 200 MG capsule Take 200 mg by mouth 2 (two) times daily.    [provider]  ciprofloxacin (CIPRO) 500 MG tablet Take 1 tablet (500 mg total) by mouth 2 (two) times daily. 12/29/23   Danelle Earthly, MD  hydrOXYzine (ATARAX/VISTARIL) 50 MG tablet Take 50 mg by mouth See admin instructions. Take 1 tablet by mouth every night at bedtime. Take 1 tablet by mouth if needed during the day. 06/23/20   [provider]    Physical Exam: Vitals:   12/30/23 1450 12/30/23 1451 12/30/23 1452 12/30/23 1918  BP:  (!) 146/79  139/70  Pulse:  66  66  Resp:  16  17  Temp:  97.7 F (36.5  C)  97.9 F (36.6 C)  TempSrc:  Oral    SpO2: 99% 99%  96%  Weight:   101 kg   Height:   5\' 2"  (1.575 m)    Physical Exam Vitals and nursing note reviewed.  Constitutional:      General: She is not in acute distress. HENT:     Head: Normocephalic and atraumatic.  Cardiovascular:     Rate and Rhythm: Normal rate and regular rhythm.     Heart sounds: Normal heart sounds.  Pulmonary:     Effort: Pulmonary effort is normal.     Breath sounds: Normal breath sounds.  Abdominal:     Palpations: Abdomen is soft.      Tenderness: There is no abdominal tenderness.  Neurological:     Mental Status: Mental status is at baseline.     Labs on Admission: I have personally reviewed following labs and imaging studies  CBC: Recent Labs  Lab 12/30/23 1522  WBC 9.3  NEUTROABS 5.6  HGB 13.2  HCT 43.1  MCV 82.6  PLT 296   Basic Metabolic Panel: Recent Labs  Lab 12/30/23 1522  NA 143  K 2.6*  CL 100  CO2 30  GLUCOSE 116*  BUN 11  CREATININE 0.59  CALCIUM 9.9   GFR: Estimated Creatinine Clearance: 82.2 mL/min (by C-G formula based on SCr of 0.59 mg/dL). Liver Function Tests: Recent Labs  Lab 12/30/23 1522  AST 22  ALT 22  ALKPHOS 115  BILITOT 0.2  PROT 6.5  ALBUMIN 3.4*   No results for input(s): "LIPASE", "AMYLASE" in the last 168 hours. No results for input(s): "AMMONIA" in the last 168 hours. Coagulation Profile: No results for input(s): "INR", "PROTIME" in the last 168 hours. Cardiac Enzymes: No results for input(s): "CKTOTAL", "CKMB", "CKMBINDEX", "TROPONINI" in the last 168 hours. BNP (last 3 results) No results for input(s): "PROBNP" in the last 8760 hours. HbA1C: No results for input(s): "HGBA1C" in the last 72 hours. CBG: No results for input(s): "GLUCAP" in the last 168 hours. Lipid Profile: No results for input(s): "CHOL", "HDL", "LDLCALC", "TRIG", "CHOLHDL", "LDLDIRECT" in the last 72 hours. Thyroid Function Tests: No results for input(s): "TSH", "T4TOTAL", "FREET4", "T3FREE", "THYROIDAB" in the last 72 hours. Anemia Panel: No results for input(s): "VITAMINB12", "FOLATE", "FERRITIN", "TIBC", "IRON", "RETICCTPCT" in the last 72 hours. Urine analysis:    Component Value Date/Time   COLORURINE YELLOW 10/16/2023 2042   APPEARANCEUR HAZY (A) 10/16/2023 2042   LABSPEC 1.018 10/16/2023 2042   PHURINE 6.0 10/16/2023 2042   GLUCOSEU NEGATIVE 10/16/2023 2042   HGBUR NEGATIVE 10/16/2023 2042   BILIRUBINUR NEGATIVE 10/16/2023 2042   BILIRUBINUR NEG 12/30/2013 1603    KETONESUR NEGATIVE 10/16/2023 2042   PROTEINUR NEGATIVE 10/16/2023 2042   UROBILINOGEN 0.2 12/30/2013 1603   UROBILINOGEN 0.2 04/09/2012 0649   NITRITE NEGATIVE 10/16/2023 2042   LEUKOCYTESUR NEGATIVE 10/16/2023 2042    Radiological Exams on Admission: No results found.   Data Reviewed: Relevant notes from primary care and specialist visits, past discharge summaries as available in EHR, including Care Everywhere. Prior diagnostic testing as pertinent to current admission diagnoses Updated medications and problem lists for reconciliation ED course, including vitals, labs, imaging, treatment and response to treatment Triage notes, nursing and pharmacy notes and ED provider's notes Notable results as noted in HPI   Assessment and Plan: T9 discitis/osteomyelitis and T9 epidural phlegmon/abscess s/p aspiration 10/29/2023 On long-term antibiotics, cefepime 1/22 - 01/05/2024 PICC line dislodgment on 12/29/2023 (had clotted PICC  on 2/19) -N.p.o. from midnight for IR placement of PICC line on 3/26 -IR Dr. Richarda Overlie aware -Continue cefepime 2 g 3 times daily via peripheral IV pending placement of PICC line -Per ID telephone visit on 3/24 "Spoke Dr. Franky Macho today, there may be a procedure that he would like pt to  be on  V abx, plans to get with me if that is the case. --Will plan to extend IV abx till 3/31 till f/u with myself..." -Consider neurosurgery consult as patient is in the house as neurosurgery wanted to do a procedure, per patient and per ID note above   Hypokalemia Oral and IV repletion and monitor  History of long term use of antibiotics due to prior spinal hardware infection MSSA and Enterobacter cloacae Previously on long-term peripheral stick Keflex followed by Bactrim following spinal hardware infection 2021  Obesity, Class III, BMI 40-49.9 (morbid obesity) (HCC) Complicating factor to overall prognosis and care  Bilateral DJD knees s/p bilateral total knee  replacement History of multiple spinal and joint surgeries Chronic pain Continue home oxycodone Flexeril as needed, Tylenol as needed  Hypertension Continue home amlodipine and atenolol  Depression Anxiety/fibromyalgia Continue home meds pending med rec    DVT prophylaxis: SCD  Consults: IR Dr Lowella Dandy  Advance Care Planning:   Code Status: Prior   Family Communication: none  Disposition Plan: Back to previous home environment  Severity of Illness: The appropriate patient status for this patient is OBSERVATION. Observation status is judged to be reasonable and necessary in order to provide the required intensity of service to ensure the patient's safety. The patient's presenting symptoms, physical exam findings, and initial radiographic and laboratory data in the context of their medical condition is felt to place them at decreased risk for further clinical deterioration. Furthermore, it is anticipated that the patient will be medically stable for discharge from the hospital within 2 midnights of admission.   Author: Andris Baumann, MD 12/30/2023 8:22 PM  For on call review [email or web address].

## 2023-12-30 NOTE — ED Triage Notes (Signed)
 Patient BIB [facility] due to her PICC line coming out yesterday. Patient states she had an appointment today to get the PICC line replaced, but decided to come to the ED instead due to not having a ride to her appointment. Home nurse reported that the PICC line had come out 14cm, so they went ahead and pulled it out.   PICC line in place due to an abscess in the back requiring antibiotics.   EMS Vital Signs HR 74 SpO2 92% RA BP 150/90

## 2023-12-30 NOTE — Telephone Encounter (Signed)
 Patient called, she was 45 minutes late to her PICC placement appointment. Advised her to wait at [facility] ED as IR is not likely to have any other availability today.   Sandie Ano, RN

## 2023-12-30 NOTE — Assessment & Plan Note (Signed)
 Complicating factor to overall prognosis and care

## 2023-12-30 NOTE — Progress Notes (Signed)
 Pharmacy Antibiotic Note  [name] is a 62 y.o. female admitted on 12/30/2023 with  PICC line removal, hx of discitis on cefepime PTA .  Pharmacy has been consulted for cefepime dosing.  Plan: Cefepime 2g q8h.  Follow culture data for de-escalation.  Monitor renal function for dose adjustments as indicated.  F/u if 3/31 remains best EOT, was extended per ID.   Height: 5\' 2"  (157.5 cm) Weight: 101 kg (222 lb 10.6 oz) IBW/kg (Calculated) : 50.1  Temp (24hrs), Avg:97.8 F (36.6 C), Min:97.7 F (36.5 C), Max:97.9 F (36.6 C)  Recent Labs  Lab 12/30/23 1522  WBC 9.3  CREATININE 0.59    Estimated Creatinine Clearance: 82.2 mL/min (by C-G formula based on SCr of 0.59 mg/dL).    Allergies  Allergen Reactions   Monosodium Glutamate Anaphylaxis and Swelling    Eyes swollen shut, facial swelling, tongue swelling.   Shellfish Allergy Anaphylaxis   Baclofen Nausea Only    Dizziness and increase muscle spasm   Celebrex [Celecoxib] Itching    Only allergic to generic brand   Contrast Media [Iodinated Contrast Media] Itching and Nausea Only    "could not walk"   Diclofenac Itching    Generic Diclefenac gel causes itching. Can take the name brand Voltaren gel   Gadolinium     Allergic to MRI contrast dye per patient.   Other Other (See Comments)    Pet dander,        Latex Itching and Rash    Latex glove with powder; bite blocks used for EGD studies    Antimicrobials this admission: Cefepime >> 3/31 per ID  Thank you for allowing pharmacy to be a part of this patient's care.  Estill Batten, PharmD, BCCCP  12/30/2023 8:10 PM

## 2023-12-30 NOTE — Assessment & Plan Note (Signed)
 Previously on long-term peripheral stick Keflex followed by Bactrim following spinal hardware infection 2021

## 2023-12-30 NOTE — Progress Notes (Signed)
 [facility] night cross cover note:   I was notified by RN that this patient's most recent potassium level is 2.7 . she is currently NPO.  I subsequently ordered potassium chloride 40 mill equivalents IV to be administered over 4 hours, and noted existing order for every 4 hours potassium levels to further trend.  Of note, magnesium level was checked this afternoon and found to be within normal limits.     Pankow Pigg, DO Hospitalist

## 2023-12-30 NOTE — ED Provider Notes (Signed)
 [place] EMERGENCY DEPARTMENT AT [facility] Provider Note   CSN: [id number] Arrival date & time: 12/30/23  1442     History Chief Complaint  Patient presents with   PICC Line came out    [name] is a 63 y.o. female. Patient with recent history of discitis and osteomyelitis of the thoracic spine region presents to the ED with concerns regarding her PICC line. She reports that her nurse came by on Monday morning and noted that her PICC line had been almost entirely removed. Patient unsure how this occurred. She has been receiving cefepime 2g TID with anticipated end of treatment for 12/30/2023 by Dr. Franky Macho, neurosurgery, had wanted to extend course until 01/05/2024. Dr. Thedore Mins is patient's infectious disease doctor. She had an IR appointment scheduled for today at 2pm but missed this appointment and is now here requesting assistance with PICC placement.   HPI     Home Medications Prior to Admission medications   Medication Sig Start Date End Date Taking? Authorizing Provider  Acetaminophen (TYLENOL PO) Take 1 tablet by mouth as needed.   Yes [provider]  albuterol (VENTOLIN HFA) 108 (90 Base) MCG/ACT inhaler Inhale 2 puffs into the lungs every 6 (six) hours as needed for wheezing or shortness of breath. 03/24/20  Yes Medina-Vargas, Monina C, NP  amLODipine (NORVASC) 5 MG tablet Take 1 tablet (5 mg total) by mouth daily. Patient taking differently: Take 5 mg by mouth in the morning. 12/04/23  Yes Mdala-Gausi, Gwenette Greet, MD  ascorbic acid (VITAMIN C) 500 MG tablet Take 500 mg by mouth in the morning.   Yes [provider]  aspirin EC 81 MG tablet Take 81 mg by mouth in the morning.   Yes [provider]  atenolol (TENORMIN) 50 MG tablet Take 1 tablet (50 mg total) by mouth daily. Patient taking differently: Take 50 mg by mouth in the morning. 03/24/20  Yes Medina-Vargas, Monina C, NP  chlorthalidone (HYGROTON) 25 MG tablet Take 1 tablet  (25 mg total) by mouth daily. Patient taking differently: Take 25 mg by mouth in the morning. 12/04/23  Yes Mdala-Gausi, Gwenette Greet, MD  chlorzoxazone (PARAFON) 500 MG tablet Take 1 tablet (500 mg total) by mouth 3 (three) times daily. Patient taking differently: Take 500 mg by mouth 4 (four) times daily. 03/24/20  Yes Medina-Vargas, Monina C, NP  Cholecalciferol (VITAMIN D-3 PO) Take 1 tablet by mouth in the morning.   Yes [provider]  cyanocobalamin (VITAMIN B12) 1000 MCG tablet Take 1,000 mcg by mouth once a week.   Yes [provider]  cyclobenzaprine (FLEXERIL) 10 MG tablet Take 10 mg by mouth 3 (three) times daily.   Yes [provider]  diazepam (VALIUM) 5 MG tablet Take 1 tablet (5 mg total) by mouth 2 (two) times daily. Patient taking differently: Take 5 mg by mouth See admin instructions. Take 5 mg at bedtime and additional 5 mg if needed for anxiety 03/22/20  Yes Medina-Vargas, Monina C, NP  diclofenac Sodium (VOLTAREN) 1 % GEL Apply 2 g topically 4 (four) times daily as needed (pain).   Yes [provider]  diphenhydrAMINE (BENADRYL) 25 MG tablet Take 50 mg by mouth in the morning, at noon, in the evening, and at bedtime.   Yes [provider]  ferrous sulfate 325 (65 FE) MG EC tablet Take 325 mg by mouth in the morning.   Yes [provider]  gabapentin (NEURONTIN) 600 MG tablet Take 2  tablets (1,200 mg total) by mouth 3 (three) times daily. 03/24/20  Yes Medina-Vargas, Monina C, NP  lubiprostone (AMITIZA) 24 MCG capsule Take 1 capsule (24 mcg total) by mouth 2 (two) times daily with a meal. Patient taking differently: Take 48 mcg by mouth daily before lunch. 03/24/20  Yes Medina-Vargas, Monina C, NP  methocarbamol (ROBAXIN) 500 MG tablet Take 500 mg by mouth 4 (four) times daily.   Yes [provider]  Multiple Minerals-Vitamins (CALCIUM-MAGNESIUM-ZINC-D3 PO) Take 1 tablet by mouth in the morning, at noon, and at bedtime.    Yes [provider]  NARCAN 4 MG/0.1ML LIQD nasal spray kit Place 1 spray into the nose once. Patient taking differently: Place 1 spray into the nose once as needed (opioid overdose). 05/26/20  Yes [provider]  omeprazole (PRILOSEC) 20 MG capsule Take 20 mg by mouth in the morning and at bedtime.    Yes [provider]  ondansetron (ZOFRAN-ODT) 4 MG disintegrating tablet Take 4 mg by mouth every 6 (six) hours as needed. 12/25/23  Yes [provider]  oxyCODONE-acetaminophen (PERCOCET) 10-325 MG tablet Take 1 tablet by mouth every 6 (six) hours as needed for pain. 12/04/23  Yes Mdala-Gausi, Gwenette Greet, MD  cefadroxil (DURICEF) 500 MG capsule Take 2 capsules (1,000 mg total) by mouth 2 (two) times daily. 12/29/23 01/28/24  Danelle Earthly, MD  celecoxib (CELEBREX) 200 MG capsule Take 200 mg by mouth 2 (two) times daily.    [provider]  ciprofloxacin (CIPRO) 500 MG tablet Take 1 tablet (500 mg total) by mouth 2 (two) times daily. 12/29/23   Danelle Earthly, MD  hydrOXYzine (ATARAX/VISTARIL) 50 MG tablet Take 50 mg by mouth See admin instructions. Take 1 tablet by mouth every night at bedtime. Take 1 tablet by mouth if needed during the day. 06/23/20   [provider]      Allergies    Monosodium glutamate, Shellfish allergy, Baclofen, Celebrex [celecoxib], Contrast media [iodinated contrast media], Diclofenac, Gadolinium, Other, and Latex    Review of Systems   Review of Systems  Constitutional: Negative.   All other systems reviewed and are negative.   Physical Exam Updated Vital Signs BP (!) 148/84   Pulse 72   Temp 99.2 F (37.3 C) (Oral)   Resp 16   Ht 5\' 2"  (1.575 m)   Wt 95.5 kg   LMP 11/20/2012   SpO2 97%   BMI 38.51 kg/m  Physical Exam Vitals and nursing note reviewed.  Constitutional:      General: She is not in acute distress.    Appearance: She is well-developed.  HENT:     Head: Normocephalic and atraumatic.   Eyes:     Conjunctiva/sclera: Conjunctivae normal.  Cardiovascular:     Rate and Rhythm: Normal rate and regular rhythm.     Heart sounds: No murmur heard. Pulmonary:     Effort: Pulmonary effort is normal. No respiratory distress.     Breath sounds: Normal breath sounds.  Abdominal:     Palpations: Abdomen is soft.     Tenderness: There is no abdominal tenderness.  Musculoskeletal:        General: No swelling.     Cervical back: Neck supple.  Skin:    General: Skin is warm and dry.     Capillary Refill: Capillary refill takes less than 2 seconds.     Comments: No evidence of cellulitis or other skin irritation at the right arm PICC line site. No purulent or  bloody drainage.  Neurological:     Mental Status: She is alert.  Psychiatric:        Mood and Affect: Mood normal.     ED Results / Procedures / Treatments   Labs (all labs ordered are listed, but only abnormal results are displayed) Labs Reviewed  CBC WITH DIFFERENTIAL/PLATELET - Abnormal; Notable for the following components:      Result Value   RBC 5.22 (*)    MCH 25.3 (*)    Eosinophils Absolute 0.6 (*)    All other components within normal limits  COMPREHENSIVE METABOLIC PANEL - Abnormal; Notable for the following components:   Potassium 2.6 (*)    Glucose, Bld 116 (*)    Albumin 3.4 (*)    All other components within normal limits  POTASSIUM - Abnormal; Notable for the following components:   Potassium 2.7 (*)    All other components within normal limits  MAGNESIUM  POTASSIUM  POTASSIUM  POTASSIUM    EKG None  Radiology No results found.  Procedures Procedures    Medications Ordered in ED Medications  ceFEPIme (MAXIPIME) 2 g in sodium chloride 0.9 % 100 mL IVPB (0 g Intravenous Stopped 12/30/23 2104)  aspirin EC tablet 81 mg (has no administration in time range)  amLODipine (NORVASC) tablet 5 mg (has no administration in time range)  atenolol (TENORMIN) tablet 50 mg (has no administration in  time range)  diazepam (VALIUM) tablet 5 mg (5 mg Oral Given 12/30/23 2226)  lubiprostone (AMITIZA) capsule 48 mcg (has no administration in time range)  chlorzoxazone (PARAFON) tablet 500 mg (500 mg Oral Given 12/30/23 2237)  gabapentin (NEURONTIN) capsule 1,200 mg (1,200 mg Oral Given 12/30/23 2224)  albuterol (PROVENTIL) (2.5 MG/3ML) 0.083% nebulizer solution 3 mL (has no administration in time range)  diclofenac Sodium (VOLTAREN) 1 % topical gel 2 g (has no administration in time range)  acetaminophen (TYLENOL) tablet 650 mg (has no administration in time range)    Or  acetaminophen (TYLENOL) suppository 650 mg (has no administration in time range)  ondansetron (ZOFRAN) tablet 4 mg (has no administration in time range)    Or  ondansetron (ZOFRAN) injection 4 mg (has no administration in time range)  HYDROmorphone (DILAUDID) injection 0.5-1 mg (has no administration in time range)  oxyCODONE (Oxy IR/ROXICODONE) immediate release tablet 10 mg (has no administration in time range)    And  acetaminophen (TYLENOL) tablet 325 mg (has no administration in time range)  potassium chloride 10 mEq in 100 mL IVPB (has no administration in time range)  potassium chloride SA (KLOR-CON M) CR tablet 40 mEq (40 mEq Oral Given 12/30/23 1730)  morphine (PF) 2 MG/ML injection 1 mg (1 mg Intravenous Given 12/30/23 2022)    ED Course/ Medical Decision Making/ A&P                                 Medical Decision Making Amount and/or Complexity of Data Reviewed Labs: ordered.  Risk Prescription drug management. Decision regarding hospitalization.   This patient presents to the ED for concern of PICC line concerns. Differential diagnosis includes PICC line removal, sepsis, discitis    Lab Tests:  I Ordered, and personally interpreted labs.  The pertinent results include: CBC unremarkable, CMP shows hypokalemia 2.6, magnesium normal 2.1    Medicines ordered and prescription drug management:  I  ordered medication including potassium, morphine for hypokalemia, pain Reevaluation of the patient  after these medicines showed that the patient improved I have reviewed the patients home medicines and have made adjustments as needed   Problem List / ED Course:  Patient presents the emergency department today with concerns of PICC line complications.  Patient reportedly has a PICC line in place for 3 times daily antibiotics for diagnosed discitis and osteomyelitis of thoracic spine.  Patient currently follows with Dr. Thedore Mins with infectious disease and Dr. Franky Macho with neurosurgery.  She reports that she had her PICC line accidentally pulled and home health nurse evaluated patient seeing that the line had been almost entirely removed yesterday morning.  Patient has not missed 3 doses of her cefepime.  Based on discussions with neurosurgery and infectious disease, patient is to have PICC line remain in place for about 1 more week until 01/05/2024. Physical exam is reassuring.  No abnormal findings with exception of removed PICC line in the right arm.  No active bleeding.  Will obtain labs and consult with IR for possible placement. Labs unremarkable with exception of mild hypokalemia.  Patient reports she has history of hypokalemia and was previously on medications but was advised to discontinue his medications by one of her providers.  Potassium repletion ordered.  Added on morphine for pain control for chronic back pain. Spoke with Dr. Lowella Dandy, IR, who will have team address this in the morning. Spoke with Dr. Para March, hospitalist, who will be admitting patient.   Final Clinical Impression(s) / ED Diagnoses Final diagnoses:  Complication associated with peripherally inserted central catheter (PICC), initial encounter  Hypokalemia    Rx / DC Orders ED Discharge Orders     None         Salomon Mast 12/30/23 2257    Terrilee Files, MD 01/01/24 1019

## 2023-12-30 NOTE — Assessment & Plan Note (Signed)
 Anxiety/fibromyalgia Continue home meds pending med rec

## 2023-12-30 NOTE — Assessment & Plan Note (Signed)
Continue home amlodipine and atenolol.

## 2023-12-30 NOTE — Assessment & Plan Note (Signed)
 History of multiple spinal and joint surgeries Chronic pain Continue home oxycodone Flexeril as needed, Tylenol as needed

## 2023-12-30 NOTE — Assessment & Plan Note (Signed)
 On long-term antibiotics, cefepime 1/22 - 01/05/2024 PICC line dislodgment on 12/29/2023 (had clotted PICC on 2/19) N.p.o. from midnight for IR placement of PICC line on 3/26 IR Dr. Richarda Overlie aware Continue cefepime 2 g 3 times daily via peripheral IV pending placement of PICC line

## 2023-12-30 NOTE — Assessment & Plan Note (Signed)
Replaced. 

## 2023-12-31 ENCOUNTER — Observation Stay (HOSPITAL_COMMUNITY)

## 2023-12-31 ENCOUNTER — Other Ambulatory Visit: Payer: Self-pay

## 2023-12-31 ENCOUNTER — Telehealth: Payer: Self-pay

## 2023-12-31 DIAGNOSIS — Z789 Other specified health status: Secondary | ICD-10-CM | POA: Diagnosis not present

## 2023-12-31 LAB — POTASSIUM
Potassium: 3 mmol/L — ABNORMAL LOW (ref 3.5–5.1)
Potassium: 3.4 mmol/L — ABNORMAL LOW (ref 3.5–5.1)

## 2023-12-31 MED ORDER — LIDOCAINE HCL (PF) 1 % IJ SOLN
10.0000 mL | Freq: Once | INTRAMUSCULAR | Status: AC
  Administered 2023-12-31: 5 mL

## 2023-12-31 MED ORDER — POTASSIUM CHLORIDE CRYS ER 20 MEQ PO TBCR
40.0000 meq | EXTENDED_RELEASE_TABLET | ORAL | Status: AC
  Administered 2023-12-31 (×2): 40 meq via ORAL
  Filled 2023-12-31 (×2): qty 2

## 2023-12-31 MED ORDER — POTASSIUM CHLORIDE 10 MEQ/100ML IV SOLN
10.0000 meq | INTRAVENOUS | Status: AC
  Administered 2023-12-31 (×3): 10 meq via INTRAVENOUS
  Filled 2023-12-31 (×2): qty 100

## 2023-12-31 MED ORDER — GABAPENTIN 250 MG/5ML PO SOLN
1200.0000 mg | Freq: Three times a day (TID) | ORAL | Status: DC
  Administered 2023-12-31: 1200 mg via ORAL
  Filled 2023-12-31 (×2): qty 24

## 2023-12-31 MED ORDER — SODIUM CHLORIDE 0.9 % IV SOLN: 2.0000 g | Freq: Three times a day (TID) | INTRAVENOUS | Status: DC

## 2023-12-31 MED ORDER — HEPARIN SOD (PORK) LOCK FLUSH 100 UNIT/ML IV SOLN
250.0000 [IU] | INTRAVENOUS | Status: AC | PRN
  Administered 2023-12-31: 250 [IU]

## 2023-12-31 MED ORDER — LIDOCAINE HCL 1 % IJ SOLN
INTRAMUSCULAR | Status: AC
  Filled 2023-12-31: qty 20

## 2023-12-31 MED ORDER — DIPHENHYDRAMINE HCL 25 MG PO CAPS
50.0000 mg | ORAL_CAPSULE | Freq: Three times a day (TID) | ORAL | Status: DC
  Administered 2023-12-31: 50 mg via ORAL
  Filled 2023-12-31 (×2): qty 2

## 2023-12-31 NOTE — Hospital Course (Signed)
 [name] is a 63 y.o. female with a history of non discitis/osteomyelitis and T9 epidural phlegmon/abscess status post aspiration currently on home IV cefepime.  Patient presented secondary to PICC line dislodgment requiring replacement.  IR was consulted and replaced the PICC line on 3/26.  Patient discharged to continue outpatient IV antibiotics.

## 2023-12-31 NOTE — Care Management Obs Status (Signed)
 MEDICARE OBSERVATION STATUS NOTIFICATION   Patient Details  Name: [name] MRN: [id number] Date of Birth: [birth year]   Medicare Observation Status Notification Given:  Yes    Lawerance Sabal, RN 12/31/2023, 1:27 PM

## 2023-12-31 NOTE — Discharge Instructions (Signed)
 [name],  You are in the hospital because of a dislodged PICC line which required replacement.  Thankfully, this was replaced easily.  Please continue your outpatient antibiotics as previously prescribed.  I discussed your case with Dr. Franky Macho who recommended follow-up next week as he is not able to get an OR time this week.

## 2023-12-31 NOTE — Discharge Summary (Signed)
 Physician Discharge Summary   Patient: [name] MRN: [id number] DOB: 1961-03-06  Admit date:     12/30/2023  Discharge date: 12/31/23  Discharge Physician: Jacquelin Hawking, MD   PCP: Irven Coe, MD   Recommendations at discharge:  PCP visit for hospital follow-up Infectious disease visit for hospital follow-up Neurosurgery visit for hospital follow-up  Discharge Diagnoses: Principal Problem:   Problem with vascular access--PICC line dislodgement Active Problems:   Hypokalemia   T9 discitis/osteomyelitis and T9 epidural phlegmon/abscess s/p aspiration 10/29/2023   Depression   Hypertension   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   Obesity, Class III, BMI 40-49.9 (morbid obesity) (HCC)   History of long term use of antibiotics due to prior spinal hardware infection MSSA and Enterobacter cloacae  Resolved Problems:   * No resolved hospital problems. *  Hospital Course: [name] is a 63 y.o. female with a history of non discitis/osteomyelitis and T9 epidural phlegmon/abscess status post aspiration currently on home IV cefepime.  Patient presented secondary to PICC line dislodgment requiring replacement.  IR was consulted and replaced the PICC line on 3/26.  Patient discharged to continue outpatient IV antibiotics.  Assessment and Plan:  T9 discitis/osteomyelitis T9 epidural phlegmon/abscess Patient is followed by infectious disease and neurosurgery as an outpatient.  She is currently on cefepime IV.  PICC line was replaced by IR on 9/26 successfully.  Continue cefepime IV on discharge.  Discussed with ID and confirmed that patient is not to continue ciprofloxacin and cefadroxil on discharge and have been discontinued.  Hypokalemia Unclear etiology.  Improved with potassium supplementation.  Chronic pain Fibromyalgia Continue outpatient pain management regimen.  Primary hypertension Continue amlodipine,chlorthalidone and  atenolol.  Depression Anxiety Continue diazepam  Obesity, class II Estimated body mass index is 38.51 kg/m as calculated from the following:   Height as of this encounter: 5\' 2"  (1.575 m).   Weight as of this encounter: 95.5 kg.   Consultants: Interventional radiology Procedures performed: Image-guided double lumen PICC line to the right basilic vein   Disposition: Home health Diet recommendation: Regular diet   DISCHARGE MEDICATION: Allergies as of 12/31/2023       Reactions   Monosodium Glutamate Anaphylaxis, Swelling   Eyes swollen shut, facial swelling, tongue swelling.   Shellfish Allergy Anaphylaxis   Baclofen Nausea Only   Dizziness and increase muscle spasm   Celebrex [celecoxib] Itching   Only allergic to generic brand   Contrast Media [iodinated Contrast Media] Itching, Nausea Only   "could not walk"   Diclofenac Itching   Generic Diclefenac gel causes itching. Can take the name brand Voltaren gel   Gadolinium    Allergic to MRI contrast dye per patient.   Other Other (See Comments)   Pet dander,    Latex Itching, Rash   Latex glove with powder; bite blocks used for EGD studies        Medication List     STOP taking these medications    cefadroxil 500 MG capsule Commonly known as: DURICEF   ciprofloxacin 500 MG tablet Commonly known as: CIPRO   cyclobenzaprine 10 MG tablet Commonly known as: FLEXERIL   hydrOXYzine 50 MG tablet Commonly known as: ATARAX       TAKE these medications    albuterol 108 (90 Base) MCG/ACT inhaler Commonly known as: VENTOLIN HFA Inhale 2 puffs into the lungs every 6 (six) hours as needed for wheezing or shortness of breath.   amLODipine 5 MG tablet Commonly known  as: NORVASC Take 1 tablet (5 mg total) by mouth daily. What changed: when to take this   ascorbic acid 500 MG tablet Commonly known as: VITAMIN C Take 500 mg by mouth in the morning.   aspirin EC 81 MG tablet Take 81 mg by mouth in the  morning.   atenolol 50 MG tablet Commonly known as: TENORMIN Take 1 tablet (50 mg total) by mouth daily. What changed: when to take this   CALCIUM-MAGNESIUM-ZINC-D3 PO Take 1 tablet by mouth in the morning, at noon, and at bedtime.   ceFEPIme 2 g in sodium chloride 0.9 % 100 mL Inject 2 g into the vein every 8 (eight) hours.   celecoxib 200 MG capsule Commonly known as: CELEBREX Take 200 mg by mouth 2 (two) times daily.   chlorthalidone 25 MG tablet Commonly known as: HYGROTON Take 1 tablet (25 mg total) by mouth daily. What changed: when to take this   chlorzoxazone 500 MG tablet Commonly known as: PARAFON Take 1 tablet (500 mg total) by mouth 3 (three) times daily. What changed: when to take this   cyanocobalamin 1000 MCG tablet Commonly known as: VITAMIN B12 Take 1,000 mcg by mouth once a week.   diazepam 5 MG tablet Commonly known as: VALIUM Take 1 tablet (5 mg total) by mouth 2 (two) times daily. What changed:  when to take this additional instructions   diclofenac Sodium 1 % Gel Commonly known as: VOLTAREN Apply 2 g topically 4 (four) times daily as needed (pain).   diphenhydrAMINE 25 MG tablet Commonly known as: BENADRYL Take 50 mg by mouth in the morning, at noon, in the evening, and at bedtime.   ferrous sulfate 325 (65 FE) MG EC tablet Take 325 mg by mouth in the morning.   gabapentin 600 MG tablet Commonly known as: NEURONTIN Take 2 tablets (1,200 mg total) by mouth 3 (three) times daily.   lubiprostone 24 MCG capsule Commonly known as: Amitiza Take 1 capsule (24 mcg total) by mouth 2 (two) times daily with a meal. What changed:  how much to take when to take this   methocarbamol 500 MG tablet Commonly known as: ROBAXIN Take 500 mg by mouth 4 (four) times daily.   Narcan 4 MG/0.1ML Liqd nasal spray kit Generic drug: naloxone Place 1 spray into the nose once. What changed:  when to take this reasons to take this   omeprazole 20 MG  capsule Commonly known as: PRILOSEC Take 20 mg by mouth in the morning and at bedtime.   ondansetron 4 MG disintegrating tablet Commonly known as: ZOFRAN-ODT Take 4 mg by mouth every 6 (six) hours as needed.   oxyCODONE-acetaminophen 10-325 MG tablet Commonly known as: PERCOCET Take 1 tablet by mouth every 6 (six) hours as needed for pain.   TYLENOL PO Take 1 tablet by mouth as needed.   VITAMIN D-3 PO Take 1 tablet by mouth in the morning.        Follow-up Information     [facility] Follow up.   Why: call for assist with transportation to doctor's appoitments Contact information: Transportation Line: [phone]        [facility] Follow up.   Why: Home IV abx to resume- they will follow for abx needs Contact information: [address], [place], [name] [address] Phone: [phone]        [facility] Follow up.   Why: HH services to resume Contact information: [phone]  [address], [place], [name] [address] Phone: [phone]        Irven Coe, MD. Schedule an appointment as soon as possible for a visit in 1 week(s).   Specialty: Family Medicine Why: For hospital follow-up Contact information: [address] [name] [address] [phone]         Danelle Earthly, MD. Go on 01/05/2024.   Specialty: Infectious Diseases Contact information: [address], [address] [name] [address] [phone]         Coletta Memos, MD. Schedule an appointment as soon as possible for a visit.   Specialty: Neurosurgery Contact information: [address] [name] [address] [phone]                Discharge Exam: BP (!) 158/78 (BP Location: Left Arm)   Pulse 67   Temp 99.8 F (37.7 C) (Oral)   Resp 16   Ht 5\' 2"  (1.575 m)   Wt 95.5 kg   LMP 11/20/2012   SpO2 99%   BMI 38.51 kg/m   General exam: Appears calm and  comfortable Respiratory system: Clear to auscultation. Respiratory effort normal. Cardiovascular system: S1 & S2 heard, RRR. No murmurs. Gastrointestinal system: Abdomen is nondistended, soft and nontender. Normal bowel sounds heard. Central nervous system: Alert and oriented. Musculoskeletal: No edema. No calf tenderness Psychiatry: Judgement and insight appear normal. Mood & affect appropriate.   Condition at discharge: stable  The results of significant diagnostics from this hospitalization (including imaging, microbiology, ancillary and laboratory) are listed below for reference.   Imaging Studies: IR PICC PLACEMENT RIGHT >5 YRS INC IMG GUIDE Result Date: 12/31/2023 INDICATION: Patient with a history of T9 discitis/osteomyelitis with T9 epidural phlegmon/abscess. Patient on long-term IV antibiotics via PICC placed by the PICC team. Line inadvertently removed by patient. Interventional radiology asked to place a new PICC. EXAM: ULTRASOUND AND FLUOROSCOPIC GUIDED PICC LINE INSERTION MEDICATIONS: 1% lidocaine 3 mL CONTRAST:  None FLUOROSCOPY TIME:  Radiation exposure index as provided by the fluoroscopic device 20 mGy air kerma COMPLICATIONS: None immediate. TECHNIQUE: The procedure, risks, benefits, and alternatives were explained to the patient and informed written consent was obtained. The right upper extremity was prepped with chlorhexidine in a sterile fashion, and a sterile drape was applied covering the operative field. Maximum barrier sterile technique with sterile gowns and gloves were used for the procedure. A timeout was performed prior to the initiation of the procedure. Local anesthesia was provided with 1% lidocaine. After the overlying soft tissues were anesthetized with 1% lidocaine, a micropuncture kit was utilized to access the right basilic vein. Real-time ultrasound guidance was utilized for vascular access including the acquisition of a permanent ultrasound image documenting  patency of the accessed vessel. A guidewire was advanced to the level of the superior caval-atrial junction for measurement purposes and the PICC line was cut to length. A peel-away sheath was placed and a 36 cm, 5 [place], dual lumen was inserted to level of the superior caval-atrial junction. A post procedure spot fluoroscopic was obtained. The catheter easily aspirated and flushed and was secured in place. A dressing was placed. The patient tolerated the procedure well without immediate post procedural complication. FINDINGS: After catheter placement, the tip lies within the superior cavoatrial junction. The catheter aspirates and flushes normally and is ready for immediate use. IMPRESSION: Successful ultrasound and fluoroscopic guided placement of a right basilic vein approach, 36 cm, 5 [place], dual lumen PICC  with tip at the superior caval-atrial junction. The PICC line is ready for immediate use. Procedure performed by Alwyn Ren NP Electronically Signed   By: Judie Petit.  Shick M.D.   On: 12/31/2023 09:32   MR ANKLE RIGHT WO CONTRAST Result Date: 12/03/2023 CLINICAL DATA:  Chronic right ankle pain.  Prior ORIF. EXAM: MRI OF THE RIGHT ANKLE WITHOUT CONTRAST TECHNIQUE: Multiplanar, multisequence MR imaging of the ankle was performed. No intravenous contrast was administered. COMPARISON:  Right ankle x-rays dated December 01, 2023. FINDINGS: TENDONS Peroneal: Peroneal longus tendon intact. Peroneal brevis intact. Posteromedial: Posterior tibial tendon intact. Flexor digitorum longus tendon intact. Flexor hallucis longus tendon intact. Anterior: Tibialis anterior tendon intact. Extensor hallucis longus tendon intact. Extensor digitorum longus tendon intact. Achilles:  Intact. Mild distal tendinosis. Plantar Fascia: Intact. Slight thickening of the proximal central pain. LIGAMENTS Lateral: Anterior talofibular ligament intact. Calcaneofibular ligament intact. Posterior talofibular ligament intact. Anterior  tibiofibular ligament is not well-visualized and may be chronically torn. Posterior tibiofibular ligament intact. Medial: Deltoid ligament intact. Spring ligament intact. CARTILAGE Ankle Joint: No joint effusion. Normal ankle mortise. No chondral defect. Subtalar Joints/Sinus Tarsi: Normal subtalar joints. No subtalar joint effusion. Normal sinus tarsi. Bones: Susceptibility artifact prior distal fibular ORIF and syndesmosis repair. No marrow signal abnormality. No fracture or dislocation. Mild midfoot degenerative changes. Soft Tissue: No soft tissue mass or fluid collection. IMPRESSION: 1. Mild distal Achilles tendinosis. 2. Mild midfoot osteoarthritis. 3. Prior distal fibular ORIF and syndesmosis repair. Electronically Signed   By: Obie Dredge M.D.   On: 12/03/2023 09:58    Microbiology: Results for orders placed or performed during the hospital encounter of 10/28/23  Blood culture (routine x 2)     Status: None   Collection Time: 10/28/23  8:12 PM   Specimen: BLOOD  Result Value Ref Range Status   Specimen Description BLOOD LEFT ANTECUBITAL  Final   Special Requests   Final    BOTTLES DRAWN AEROBIC ONLY Blood Culture adequate volume   Culture   Final    NO GROWTH 5 DAYS Performed at [facility], [address], [place], [name] [address]    Report Status 11/02/2023 FINAL  Final  Blood culture (routine x 2)     Status: None   Collection Time: 10/28/23  8:24 PM   Specimen: BLOOD  Result Value Ref Range Status   Specimen Description BLOOD RIGHT ANTECUBITAL  Final   Special Requests   Final    BOTTLES DRAWN AEROBIC AND ANAEROBIC Blood Culture adequate volume   Culture   Final    NO GROWTH 5 DAYS Performed at [facility], [address], [place], [name] [address]    Report Status 11/02/2023 FINAL  Final  Aerobic/Anaerobic Culture w Gram Stain (surgical/deep wound)     Status: None   Collection Time: 10/29/23  3:45 PM   Specimen: Fine Needle Aspirate  Result Value Ref  Range Status   Specimen Description NEEDLE ASPIRATE  Final   Special Requests INTERVERTEBRAL DISC  Final   Gram Stain NO WBC SEEN NO ORGANISMS SEEN   Final   Culture   Final    No growth aerobically or anaerobically. Performed at [facility], [address], [place], [name] [address]    Report Status 11/03/2023 FINAL  Final  Aerobic/Anaerobic Culture w Gram Stain (surgical/deep wound)     Status: None   Collection Time: 10/29/23  3:45 PM   Specimen: NEEDLE ASPIRATE; Abscess  Result Value Ref  Range Status   Specimen Description NEEDLE ASPIRATE  Final   Special Requests INTERVERTEBRAL DISC  Final   Gram Stain NO WBC SEEN NO ORGANISMS SEEN   Final   Culture   Final    No growth aerobically or anaerobically. Performed at [facility], [address], [place], [name] [address]    Report Status 11/03/2023 FINAL  Final    Labs: CBC: Recent Labs  Lab 12/30/23 1522  WBC 9.3  NEUTROABS 5.6  HGB 13.2  HCT 43.1  MCV 82.6  PLT 296   Basic Metabolic Panel: Recent Labs  Lab 12/30/23 1522 12/30/23 1650 12/30/23 2103 12/31/23 0415 12/31/23 1029  NA 143  --   --   --   --   K 2.6*  --  2.7* 3.0* 3.4*  CL 100  --   --   --   --   CO2 30  --   --   --   --   GLUCOSE 116*  --   --   --   --   BUN 11  --   --   --   --   CREATININE 0.59  --   --   --   --   CALCIUM 9.9  --   --   --   --   MG  --  2.1  --   --   --    Liver Function Tests: Recent Labs  Lab 12/30/23 1522  AST 22  ALT 22  ALKPHOS 115  BILITOT 0.2  PROT 6.5  ALBUMIN 3.4*    Discharge time spent: 35 minutes.  Signed: Jacquelin Hawking, MD [facility] 12/31/2023

## 2023-12-31 NOTE — Plan of Care (Signed)

## 2023-12-31 NOTE — Progress Notes (Signed)
 Patient discharged with home IV medication.  Asked charge RN if patient should stay until the 10:15 pm IV dose in the [facility] and she said no patient is getting it at home.

## 2023-12-31 NOTE — TOC Transition Note (Signed)
 Transition of Care (TOC) - Discharge Note Donn Pierini RN, BSN Transitions of Care Unit 4E- RN Case Manager See Treatment Team for direct phone #   Patient Details  Name: [name] MRN: [id number] Date of Birth: [birth year]  Transition of Care [facility]) CM/SW Contact:  Darrold Span, RN Phone Number: 12/31/2023, 3:09 PM   Clinical Narrative:    Pt stable for transition home, new PICC line has been placed. Pt active with [facility] and [facility] for Home IV abx needs- liaison has been updated for resumption of care.  CM spoke with pt at bedside- per pt she will need non-emergent EMS transport as she is currently non-ambulatory. Address confirmed for transport. Pt also has an appointment with ID next week- she plans to call office to see about either virtual appointment or re-scheduling, she states she is aware that insurance will provide transportation has has that # to call if she needs it for future appointments.   Pt confirms she has spoken with HH and infusion liaisons with [facility] and [facility] for ongoing home IV abx needs. She also voiced she has several rollators, walkers, crutches, transport chair at home. She states that she has been working on a wheelchair and tub transport bench since she was discharged from SNF. She would also like an upright walker- which she is going to contact her PCP about.   1450- [facility] contacted for transport- per dispatch ETA for pickup is 1-2hrs. Paperwork placed on chart.   No further TOC needs noted.    Final next level of care: Home w Home Health Services Barriers to Discharge: No Barriers Identified   Patient Goals and CMS Choice Patient states their goals for this hospitalization and ongoing recovery are:: return home CMS Medicare.gov Compare Post Acute Care list provided to:: Patient Choice offered to / list presented to : Patient      Discharge Placement               Home w/ [facility]        Discharge Plan and  Services Additional resources added to the After Visit Summary for     Discharge Planning Services: CM Consult Post Acute Care Choice: Home Health, Resumption of Svcs/PTA Provider          DME Arranged: N/A DME Agency: NA       HH Arranged: RN, IV Antibiotics HH Agency: [facility] Date HH Agency Contacted: 12/31/23 Time HH Agency Contacted: 1508 Representative spoke with at [facility] Agency: Pam  Social Drivers of Health (SDOH) Interventions SDOH Screenings   Food Insecurity: No Food Insecurity (12/31/2023)  Housing: Low Risk  (12/31/2023)  Transportation Needs: No Transportation Needs (12/31/2023)  Utilities: Not At Risk (12/31/2023)  Depression (PHQ2-9): Low Risk  (01/16/2023)  Tobacco Use: Medium Risk (12/30/2023)     Readmission Risk Interventions    05/17/2022    3:02 PM  Readmission Risk Prevention Plan  Transportation Screening Complete  PCP or Specialist Appt within 5-7 Days Complete  Home Care Screening Complete  Medication Review (RN CM) Complete

## 2023-12-31 NOTE — Procedures (Signed)
 PROCEDURE SUMMARY:  Successful placement of image-guided double lumen PICC line to the right basilic vein. Length 36 cm. Tip at lower SVC/RA. No complications. EBL = <2 ml . Ready for use.  Please see imaging section of Epic for full dictation.   Mickie Kay, NP 12/31/2023 9:19 AM

## 2023-12-31 NOTE — Telephone Encounter (Signed)
 Patient called from hospital wanting to know if her appointment with Dr. Thedore Mins can be virtual. She has a very hard time getting around and can't walk.   Routing to provider.   Sandie Ano, RN

## 2024-01-05 ENCOUNTER — Telehealth: Payer: Self-pay

## 2024-01-05 ENCOUNTER — Ambulatory Visit: Payer: 59 | Admitting: Internal Medicine

## 2024-01-05 NOTE — Telephone Encounter (Signed)
 Left HIPAA compliant VM after No Show and sent mchart msg.

## 2024-01-05 NOTE — Telephone Encounter (Signed)
 Per Dr Thedore Mins: Extending Iv abx through 01/19/24 and moved follow up with Dr Thedore Mins until after patient has surgery next Thursday. Message sent to [facility] Team.  I called [name] and moved follow up visit at [facility] to 01/20/24 3:30 pm and Victorino Dike at [address] will let Diane know about appt change.  Also alerted [facility] Pharmacy Team of extended IV abx for OPAT tracking.

## 2024-01-05 NOTE — Telephone Encounter (Signed)
 Patient called office today stating she has not heard from Dr. Franky Macho regarding surgery date. Is requesting to extend Iv antibiotics orders and convert appt to virtual.  Spoke with Dr. Thedore Mins who is okay with extending orders. Fine to reschedule appt while we wait to hear from Dr. Franky Macho. HH was able to get labs. Should have results later this week. Patient will reach out to surgeon for appt.  Juanita Laster, RMA

## 2024-01-05 NOTE — Telephone Encounter (Signed)
 Received following message from [facility]; Cece - when our refill tech reached out to verify qty of current abx at home, Mrs [name] notified us that she has surgery on thursday, not sure if it is INP or OP but should we send enough to get through these days  Will update provider. Juanita Laster, RMA

## 2024-01-07 ENCOUNTER — Encounter (HOSPITAL_COMMUNITY): Payer: Self-pay | Admitting: Neurosurgery

## 2024-01-07 ENCOUNTER — Other Ambulatory Visit: Payer: Self-pay

## 2024-01-07 NOTE — Progress Notes (Signed)
 PCP - Irven Coe, MD  Cardiologist -   PPM/ICD - denies Device Orders - n/a Rep Notified - n.a  Chest x-ray - 11-26-23 EKG - 10-28-23 Stress Test - 10+ years ago ECHO - 12-30-23 Cardiac Cath -   CPAP - denies  Dm -denies  Blood Thinner Instructions: denies Aspirin Instructions: last dose 01-06-24 per patient  ERAS Protcol - clear liquids until 1200 (noon)  COVID TEST- n.a  Anesthesia review: yes  Patient verbally denies any shortness of breath, fever, cough and chest pain during phone call   -------------  SDW INSTRUCTIONS given:  Your procedure is scheduled on January 08, 2024.  Report to [facility] Main Entrance "A" at 12:30 P.M., and check in at the Admitting office.  Call this number if you have problems the morning of surgery:  [phone]   Remember:  Do not eat after midnight the night before your surgery  You may drink clear liquids until noon  day of your surgery.   Clear liquids allowed are: Water, Non-Citrus Juices (without pulp), Carbonated Beverages, Clear Tea, Black Coffee Only, and Gatorade    Take these medicines the morning of surgery with A SIP OF WATER  Acetaminophen (TYLENOL PO)  albuterol (VENTOLIN HFA) inhaler  amLODipine (NORVASC)  atenolol (TENORMIN)  chlorzoxazone (PARAFON)  diazepam (VALIUM  diphenhydrAMINE (BENADRYL)  gabapentin (NEURONTIN)  methocarbamol (ROBAXIN)  NARCAN  omeprazole (PRILOSEC)  ondansetron (ZOFRAN-ODT)  oxyCODONE-acetaminophen (PERCOCET)   As of today, STOP taking any Aspirin (unless otherwise instructed by your surgeon) Aleve, Naproxen, Ibuprofen, Motrin, Advil, Goody's, BC's, all herbal medications, fish oil, and all vitamins. THIS INCLUDES YOUR diclofenac Sodium (VOLTAREN).                       Do not wear jewelry, make up, or nail polish            Do not wear lotions, powders, perfumes/colognes, or deodorant.            Do not shave 48 hours prior to surgery.  Men may shave face and neck.            Do not  bring valuables to the hospital.            [facility] is not responsible for any belongings or valuables.  Do NOT Smoke (Tobacco/Vaping) 24 hours prior to your procedure If you use a CPAP at night, you may bring all equipment for your overnight stay.   Contacts, glasses, dentures or bridgework may not be worn into surgery.      For patients admitted to the hospital, discharge time will be determined by your treatment team.   Patients discharged the day of surgery will not be allowed to drive home, and someone needs to stay with them for 24 hours.    Special instructions:   [facility]- Preparing For Surgery  Before surgery, you can play an important role. Because skin is not sterile, your skin needs to be as free of germs as possible. You can reduce the number of germs on your skin by washing with CHG (chlorahexidine gluconate) Soap before surgery.  CHG is an antiseptic cleaner which kills germs and bonds with the skin to continue killing germs even after washing.    Oral Hygiene is also important to reduce your risk of infection.  Remember - BRUSH YOUR TEETH THE MORNING OF SURGERY WITH YOUR REGULAR TOOTHPASTE  Please do not use if you have an allergy to CHG or  antibacterial soaps. If your skin becomes reddened/irritated stop using the CHG.  Do not shave (including legs and underarms) for at least 48 hours prior to first CHG shower. It is OK to shave your face.  Please follow these instructions carefully.   Shower the NIGHT BEFORE SURGERY and the MORNING OF SURGERY with DIAL Soap.   Pat yourself dry with a CLEAN TOWEL.  Wear CLEAN PAJAMAS to bed the night before surgery  Place CLEAN SHEETS on your bed the night of your first shower and DO NOT SLEEP WITH PETS.   Day of Surgery: Please shower morning of surgery  Wear Clean/Comfortable clothing the morning of surgery Do not apply any deodorants/lotions.   Remember to brush your teeth WITH YOUR REGULAR TOOTHPASTE.   Questions  were answered. Patient verbalized understanding of instructions.

## 2024-01-08 ENCOUNTER — Encounter (HOSPITAL_COMMUNITY): Payer: Self-pay | Admitting: Neurosurgery

## 2024-01-08 ENCOUNTER — Encounter (HOSPITAL_COMMUNITY)
Admission: RE | Disposition: A | Discharge status: Rehabilitation Facility | Payer: Self-pay | Source: Home / Self Care | Attending: Neurosurgery

## 2024-01-08 ENCOUNTER — Ambulatory Visit (HOSPITAL_COMMUNITY): Admitting: Physician Assistant

## 2024-01-08 ENCOUNTER — Other Ambulatory Visit: Payer: Self-pay

## 2024-01-08 ENCOUNTER — Ambulatory Visit (HOSPITAL_BASED_OUTPATIENT_CLINIC_OR_DEPARTMENT_OTHER): Admitting: Physician Assistant

## 2024-01-08 ENCOUNTER — Ambulatory Visit (HOSPITAL_COMMUNITY)

## 2024-01-08 ENCOUNTER — Inpatient Hospital Stay (HOSPITAL_COMMUNITY)
Admission: RE | Admit: 2024-01-08 | Discharge: 2024-01-19 | DRG: 519 | Disposition: A | Discharge status: Rehabilitation Facility | Attending: Neurosurgery | Admitting: Neurosurgery

## 2024-01-08 DIAGNOSIS — Z87891 Personal history of nicotine dependence: Secondary | ICD-10-CM

## 2024-01-08 DIAGNOSIS — E876 Hypokalemia: Secondary | ICD-10-CM | POA: Diagnosis present

## 2024-01-08 DIAGNOSIS — Z981 Arthrodesis status: Secondary | ICD-10-CM

## 2024-01-08 DIAGNOSIS — M797 Fibromyalgia: Secondary | ICD-10-CM | POA: Diagnosis present

## 2024-01-08 DIAGNOSIS — Z6838 Body mass index (BMI) 38.0-38.9, adult: Secondary | ICD-10-CM

## 2024-01-08 DIAGNOSIS — Z8249 Family history of ischemic heart disease and other diseases of the circulatory system: Secondary | ICD-10-CM

## 2024-01-08 DIAGNOSIS — G4733 Obstructive sleep apnea (adult) (pediatric): Secondary | ICD-10-CM | POA: Diagnosis present

## 2024-01-08 DIAGNOSIS — J45909 Unspecified asthma, uncomplicated: Secondary | ICD-10-CM | POA: Diagnosis present

## 2024-01-08 DIAGNOSIS — K581 Irritable bowel syndrome with constipation: Secondary | ICD-10-CM | POA: Diagnosis present

## 2024-01-08 DIAGNOSIS — G992 Myelopathy in diseases classified elsewhere: Secondary | ICD-10-CM | POA: Diagnosis present

## 2024-01-08 DIAGNOSIS — Z79899 Other long term (current) drug therapy: Secondary | ICD-10-CM

## 2024-01-08 DIAGNOSIS — E079 Disorder of thyroid, unspecified: Secondary | ICD-10-CM

## 2024-01-08 DIAGNOSIS — K589 Irritable bowel syndrome without diarrhea: Secondary | ICD-10-CM

## 2024-01-08 DIAGNOSIS — E669 Obesity, unspecified: Secondary | ICD-10-CM | POA: Diagnosis present

## 2024-01-08 DIAGNOSIS — K592 Neurogenic bowel, not elsewhere classified: Secondary | ICD-10-CM | POA: Diagnosis present

## 2024-01-08 DIAGNOSIS — Z515 Encounter for palliative care: Secondary | ICD-10-CM

## 2024-01-08 DIAGNOSIS — G4700 Insomnia, unspecified: Secondary | ICD-10-CM | POA: Diagnosis present

## 2024-01-08 DIAGNOSIS — K219 Gastro-esophageal reflux disease without esophagitis: Secondary | ICD-10-CM | POA: Diagnosis present

## 2024-01-08 DIAGNOSIS — Z91041 Radiographic dye allergy status: Secondary | ICD-10-CM

## 2024-01-08 DIAGNOSIS — Z96653 Presence of artificial knee joint, bilateral: Secondary | ICD-10-CM | POA: Diagnosis present

## 2024-01-08 DIAGNOSIS — Z87892 Personal history of anaphylaxis: Secondary | ICD-10-CM

## 2024-01-08 DIAGNOSIS — Z90711 Acquired absence of uterus with remaining cervical stump: Secondary | ICD-10-CM

## 2024-01-08 DIAGNOSIS — Z823 Family history of stroke: Secondary | ICD-10-CM

## 2024-01-08 DIAGNOSIS — N319 Neuromuscular dysfunction of bladder, unspecified: Secondary | ICD-10-CM | POA: Diagnosis present

## 2024-01-08 DIAGNOSIS — M4804 Spinal stenosis, thoracic region: Principal | ICD-10-CM | POA: Diagnosis present

## 2024-01-08 DIAGNOSIS — F419 Anxiety disorder, unspecified: Secondary | ICD-10-CM | POA: Diagnosis present

## 2024-01-08 DIAGNOSIS — I10 Essential (primary) hypertension: Secondary | ICD-10-CM | POA: Diagnosis present

## 2024-01-08 HISTORY — PX: THORACIC DISCECTOMY: SHX6113

## 2024-01-08 LAB — CBC
HCT: 45 % (ref 36.0–46.0)
Hemoglobin: 13.6 g/dL (ref 12.0–15.0)
MCH: 25 pg — ABNORMAL LOW (ref 26.0–34.0)
MCHC: 30.2 g/dL (ref 30.0–36.0)
MCV: 82.6 fL (ref 80.0–100.0)
Platelets: 245 10*3/uL (ref 150–400)
RBC: 5.45 MIL/uL — ABNORMAL HIGH (ref 3.87–5.11)
RDW: 13.3 % (ref 11.5–15.5)
WBC: 9.8 10*3/uL (ref 4.0–10.5)
nRBC: 0 % (ref 0.0–0.2)

## 2024-01-08 LAB — SURGICAL PCR SCREEN
MRSA, PCR: NEGATIVE
Staphylococcus aureus: NEGATIVE

## 2024-01-08 LAB — CREATININE, SERUM
Creatinine, Ser: 0.71 mg/dL (ref 0.44–1.00)
GFR, Estimated: >60 mL/min (ref >60)

## 2024-01-08 SURGERY — THORACIC DISCECTOMY
Anesthesia: General | Site: Spine Thoracic

## 2024-01-08 MED ORDER — LIDOCAINE-EPINEPHRINE 0.5 %-1:200000 IJ SOLN
INTRAMUSCULAR | Status: DC | PRN
Start: 2024-01-08 — End: 2024-01-08
  Administered 2024-01-08: 50 mL

## 2024-01-08 MED ORDER — CHLORTHALIDONE 25 MG PO TABS
25.0000 mg | ORAL_TABLET | Freq: Every day | ORAL | Status: DC
  Administered 2024-01-08 – 2024-01-19 (×12): 25 mg via ORAL
  Filled 2024-01-08 (×13): qty 1

## 2024-01-08 MED ORDER — MENTHOL 3 MG MT LOZG: 1.0000 | LOZENGE | OROMUCOSAL | Status: DC | PRN

## 2024-01-08 MED ORDER — DEXAMETHASONE SODIUM PHOSPHATE 10 MG/ML IJ SOLN
INTRAMUSCULAR | Status: DC | PRN
  Administered 2024-01-08: 10 mg via INTRAVENOUS

## 2024-01-08 MED ORDER — ATENOLOL 50 MG PO TABS
50.0000 mg | ORAL_TABLET | Freq: Every day | ORAL | Status: DC
  Administered 2024-01-09 – 2024-01-19 (×11): 50 mg via ORAL
  Filled 2024-01-08 (×12): qty 1

## 2024-01-08 MED ORDER — ONDANSETRON HCL 4 MG/2ML IJ SOLN
INTRAMUSCULAR | Status: DC | PRN
  Administered 2024-01-08: 4 mg via INTRAVENOUS

## 2024-01-08 MED ORDER — ONDANSETRON HCL 4 MG/2ML IJ SOLN: 4.0000 mg | Freq: Once | INTRAMUSCULAR | Status: DC | PRN

## 2024-01-08 MED ORDER — SODIUM CHLORIDE 0.9 % IV SOLN
2.0000 g | Freq: Three times a day (TID) | INTRAVENOUS | Status: DC
  Administered 2024-01-08 – 2024-01-13 (×15): 2 g via INTRAVENOUS
  Filled 2024-01-08 (×15): qty 12.5

## 2024-01-08 MED ORDER — LUBIPROSTONE 24 MCG PO CAPS
48.0000 ug | ORAL_CAPSULE | Freq: Every day | ORAL | Status: DC
  Administered 2024-01-09 – 2024-01-19 (×11): 48 ug via ORAL
  Filled 2024-01-08 (×11): qty 2

## 2024-01-08 MED ORDER — LIDOCAINE 2% (20 MG/ML) 5 ML SYRINGE
INTRAMUSCULAR | Status: DC | PRN
  Administered 2024-01-08: 20 mg via INTRAVENOUS

## 2024-01-08 MED ORDER — VANCOMYCIN HCL 1000 MG IV SOLR
INTRAVENOUS | Status: AC
  Filled 2024-01-08: qty 20

## 2024-01-08 MED ORDER — MIDAZOLAM HCL 2 MG/2ML IJ SOLN
INTRAMUSCULAR | Status: AC
  Filled 2024-01-08: qty 2

## 2024-01-08 MED ORDER — MIDAZOLAM HCL 2 MG/2ML IJ SOLN
INTRAMUSCULAR | Status: DC | PRN
  Administered 2024-01-08: 2 mg via INTRAVENOUS

## 2024-01-08 MED ORDER — MAGNESIUM CITRATE PO SOLN: 1.0000 | Freq: Once | ORAL | Status: DC | PRN

## 2024-01-08 MED ORDER — OXYCODONE HCL 5 MG PO TABS
10.0000 mg | ORAL_TABLET | ORAL | Status: DC | PRN
  Administered 2024-01-08 – 2024-01-19 (×15): 10 mg via ORAL
  Filled 2024-01-08 (×15): qty 2

## 2024-01-08 MED ORDER — LIDOCAINE 2% (20 MG/ML) 5 ML SYRINGE
INTRAMUSCULAR | Status: AC
  Filled 2024-01-08: qty 5

## 2024-01-08 MED ORDER — ONDANSETRON HCL 4 MG/2ML IJ SOLN
INTRAMUSCULAR | Status: AC
  Filled 2024-01-08: qty 2

## 2024-01-08 MED ORDER — BACITRACIN ZINC 500 UNIT/GM EX OINT
TOPICAL_OINTMENT | CUTANEOUS | Status: AC
  Filled 2024-01-08: qty 28.35

## 2024-01-08 MED ORDER — CHLORZOXAZONE 500 MG PO TABS
500.0000 mg | ORAL_TABLET | Freq: Four times a day (QID) | ORAL | Status: DC
  Administered 2024-01-12 – 2024-01-19 (×30): 500 mg via ORAL
  Filled 2024-01-08 (×50): qty 1

## 2024-01-08 MED ORDER — LACTATED RINGERS IV SOLN: INTRAVENOUS | Status: DC

## 2024-01-08 MED ORDER — VITAMIN B-12 1000 MCG PO TABS
1000.0000 ug | ORAL_TABLET | ORAL | Status: DC
  Administered 2024-01-09 – 2024-01-16 (×2): 1000 ug via ORAL
  Filled 2024-01-08 (×5): qty 1

## 2024-01-08 MED ORDER — AMLODIPINE BESYLATE 5 MG PO TABS
5.0000 mg | ORAL_TABLET | Freq: Every morning | ORAL | Status: DC
  Administered 2024-01-09 – 2024-01-19 (×11): 5 mg via ORAL
  Filled 2024-01-08 (×11): qty 1

## 2024-01-08 MED ORDER — BACITRACIN ZINC 500 UNIT/GM EX OINT
TOPICAL_OINTMENT | CUTANEOUS | Status: DC | PRN
  Administered 2024-01-08: 1 via TOPICAL

## 2024-01-08 MED ORDER — THROMBIN 5000 UNITS EX KIT
PACK | CUTANEOUS | Status: AC
  Filled 2024-01-08: qty 2

## 2024-01-08 MED ORDER — FENTANYL CITRATE (PF) 250 MCG/5ML IJ SOLN
INTRAMUSCULAR | Status: AC
  Filled 2024-01-08: qty 5

## 2024-01-08 MED ORDER — VITAMIN D 25 MCG (1000 UNIT) PO TABS
1000.0000 [IU] | ORAL_TABLET | Freq: Every day | ORAL | Status: DC
  Administered 2024-01-09 – 2024-01-19 (×11): 1000 [IU] via ORAL
  Filled 2024-01-08 (×12): qty 1

## 2024-01-08 MED ORDER — PANTOPRAZOLE SODIUM 40 MG PO TBEC
40.0000 mg | DELAYED_RELEASE_TABLET | Freq: Every day | ORAL | Status: DC
  Administered 2024-01-09 – 2024-01-19 (×11): 40 mg via ORAL
  Filled 2024-01-08 (×11): qty 1

## 2024-01-08 MED ORDER — ORAL CARE MOUTH RINSE: 15.0000 mL | Freq: Once | OROMUCOSAL | Status: AC

## 2024-01-08 MED ORDER — FERROUS SULFATE 325 (65 FE) MG PO TABS
325.0000 mg | ORAL_TABLET | Freq: Every morning | ORAL | Status: DC
  Administered 2024-01-09 – 2024-01-19 (×11): 325 mg via ORAL
  Filled 2024-01-08 (×12): qty 1

## 2024-01-08 MED ORDER — POTASSIUM CHLORIDE IN NACL 20-0.9 MEQ/L-% IV SOLN: INTRAVENOUS | Status: AC

## 2024-01-08 MED ORDER — LIDOCAINE-EPINEPHRINE 0.5 %-1:200000 IJ SOLN
INTRAMUSCULAR | Status: AC
  Filled 2024-01-08: qty 50

## 2024-01-08 MED ORDER — ONDANSETRON HCL 4 MG/2ML IJ SOLN: 4.0000 mg | Freq: Four times a day (QID) | INTRAMUSCULAR | Status: DC | PRN

## 2024-01-08 MED ORDER — DIAZEPAM 5 MG PO TABS
5.0000 mg | ORAL_TABLET | Freq: Every day | ORAL | Status: DC
  Administered 2024-01-08 – 2024-01-18 (×11): 5 mg via ORAL
  Filled 2024-01-08 (×11): qty 1

## 2024-01-08 MED ORDER — ROCURONIUM BROMIDE 10 MG/ML (PF) SYRINGE
PREFILLED_SYRINGE | INTRAVENOUS | Status: DC | PRN
  Administered 2024-01-08: 20 mg via INTRAVENOUS
  Administered 2024-01-08: 60 mg via INTRAVENOUS

## 2024-01-08 MED ORDER — VITAMIN C 500 MG PO TABS
500.0000 mg | ORAL_TABLET | Freq: Every morning | ORAL | Status: DC
  Administered 2024-01-09 – 2024-01-19 (×11): 500 mg via ORAL
  Filled 2024-01-08 (×11): qty 1

## 2024-01-08 MED ORDER — MORPHINE SULFATE (PF) 2 MG/ML IV SOLN
2.0000 mg | INTRAVENOUS | Status: DC | PRN
  Administered 2024-01-15 – 2024-01-16 (×3): 2 mg via INTRAVENOUS
  Filled 2024-01-08 (×3): qty 1

## 2024-01-08 MED ORDER — ACETAMINOPHEN 500 MG PO TABS
1000.0000 mg | ORAL_TABLET | Freq: Once | ORAL | Status: AC
  Administered 2024-01-08: 1000 mg via ORAL
  Filled 2024-01-08: qty 2

## 2024-01-08 MED ORDER — DIPHENHYDRAMINE HCL 25 MG PO TABS
50.0000 mg | ORAL_TABLET | Freq: Three times a day (TID) | ORAL | Status: DC | PRN
  Administered 2024-01-08: 50 mg via ORAL
  Filled 2024-01-08 (×2): qty 2

## 2024-01-08 MED ORDER — ASPIRIN 81 MG PO TBEC
81.0000 mg | DELAYED_RELEASE_TABLET | Freq: Every morning | ORAL | Status: DC
  Administered 2024-01-09 – 2024-01-19 (×11): 81 mg via ORAL
  Filled 2024-01-08 (×11): qty 1

## 2024-01-08 MED ORDER — CHLORHEXIDINE GLUCONATE 0.12 % MT SOLN
15.0000 mL | Freq: Once | OROMUCOSAL | Status: AC
  Administered 2024-01-08: 15 mL via OROMUCOSAL
  Filled 2024-01-08: qty 15

## 2024-01-08 MED ORDER — FENTANYL CITRATE (PF) 100 MCG/2ML IJ SOLN: 25.0000 ug | INTRAMUSCULAR | Status: DC | PRN

## 2024-01-08 MED ORDER — ONDANSETRON 4 MG PO TBDP
4.0000 mg | ORAL_TABLET | Freq: Four times a day (QID) | ORAL | Status: DC | PRN
  Administered 2024-01-13 – 2024-01-14 (×2): 4 mg via ORAL
  Filled 2024-01-08 (×2): qty 1

## 2024-01-08 MED ORDER — ROCURONIUM BROMIDE 10 MG/ML (PF) SYRINGE
PREFILLED_SYRINGE | INTRAVENOUS | Status: AC
  Filled 2024-01-08: qty 10

## 2024-01-08 MED ORDER — AMISULPRIDE (ANTIEMETIC) 5 MG/2ML IV SOLN: 10.0000 mg | Freq: Once | INTRAVENOUS | Status: DC | PRN

## 2024-01-08 MED ORDER — ALBUTEROL SULFATE (2.5 MG/3ML) 0.083% IN NEBU: 2.5000 mg | INHALATION_SOLUTION | Freq: Four times a day (QID) | RESPIRATORY_TRACT | Status: DC | PRN

## 2024-01-08 MED ORDER — 0.9 % SODIUM CHLORIDE (POUR BTL) OPTIME
TOPICAL | Status: DC | PRN
  Administered 2024-01-08: 1000 mL

## 2024-01-08 MED ORDER — CEFAZOLIN SODIUM-DEXTROSE 2-4 GM/100ML-% IV SOLN
INTRAVENOUS | Status: AC
  Filled 2024-01-08: qty 100

## 2024-01-08 MED ORDER — BISACODYL 5 MG PO TBEC: 5.0000 mg | DELAYED_RELEASE_TABLET | Freq: Every day | ORAL | Status: DC | PRN

## 2024-01-08 MED ORDER — SODIUM CHLORIDE 0.9% FLUSH: 3.0000 mL | INTRAVENOUS | Status: DC | PRN

## 2024-01-08 MED ORDER — OXYCODONE HCL ER 10 MG PO T12A
10.0000 mg | EXTENDED_RELEASE_TABLET | Freq: Two times a day (BID) | ORAL | Status: DC
  Administered 2024-01-08 – 2024-01-19 (×22): 10 mg via ORAL
  Filled 2024-01-08 (×22): qty 1

## 2024-01-08 MED ORDER — SUGAMMADEX SODIUM 200 MG/2ML IV SOLN
INTRAVENOUS | Status: DC | PRN
  Administered 2024-01-08: 189.6 mg via INTRAVENOUS

## 2024-01-08 MED ORDER — PROPOFOL 10 MG/ML IV BOLUS
INTRAVENOUS | Status: AC
  Filled 2024-01-08: qty 20

## 2024-01-08 MED ORDER — DEXAMETHASONE SODIUM PHOSPHATE 10 MG/ML IJ SOLN
INTRAMUSCULAR | Status: AC
  Filled 2024-01-08: qty 1

## 2024-01-08 MED ORDER — HEPARIN SODIUM (PORCINE) 5000 UNIT/ML IJ SOLN
5000.0000 [IU] | Freq: Three times a day (TID) | INTRAMUSCULAR | Status: DC
  Administered 2024-01-08 – 2024-01-19 (×33): 5000 [IU] via SUBCUTANEOUS
  Filled 2024-01-08 (×33): qty 1

## 2024-01-08 MED ORDER — DIPHENHYDRAMINE HCL 25 MG PO CAPS
50.0000 mg | ORAL_CAPSULE | Freq: Three times a day (TID) | ORAL | Status: DC | PRN
  Administered 2024-01-09 – 2024-01-19 (×18): 50 mg via ORAL
  Filled 2024-01-08 (×19): qty 2

## 2024-01-08 MED ORDER — OXYCODONE HCL 5 MG PO TABS
5.0000 mg | ORAL_TABLET | ORAL | Status: DC | PRN
  Administered 2024-01-09 – 2024-01-11 (×4): 5 mg via ORAL
  Filled 2024-01-08 (×4): qty 1

## 2024-01-08 MED ORDER — ACETAMINOPHEN 325 MG PO TABS
650.0000 mg | ORAL_TABLET | ORAL | Status: DC | PRN
  Administered 2024-01-08 – 2024-01-19 (×6): 650 mg via ORAL
  Filled 2024-01-08 (×6): qty 2

## 2024-01-08 MED ORDER — GABAPENTIN 400 MG PO CAPS
1200.0000 mg | ORAL_CAPSULE | Freq: Three times a day (TID) | ORAL | Status: DC
  Administered 2024-01-08 – 2024-01-19 (×33): 1200 mg via ORAL
  Filled 2024-01-08 (×7): qty 3
  Filled 2024-01-08: qty 12
  Filled 2024-01-08 (×3): qty 3
  Filled 2024-01-08: qty 12
  Filled 2024-01-08 (×15): qty 3
  Filled 2024-01-08: qty 12
  Filled 2024-01-08 (×4): qty 3
  Filled 2024-01-08: qty 12

## 2024-01-08 MED ORDER — PROPOFOL 10 MG/ML IV BOLUS
INTRAVENOUS | Status: DC | PRN
  Administered 2024-01-08: 100 mg via INTRAVENOUS

## 2024-01-08 MED ORDER — SENNA 8.6 MG PO TABS
1.0000 | ORAL_TABLET | Freq: Two times a day (BID) | ORAL | Status: DC
  Administered 2024-01-08 – 2024-01-19 (×22): 8.6 mg via ORAL
  Filled 2024-01-08 (×22): qty 1

## 2024-01-08 MED ORDER — PHENOL 1.4 % MT LIQD: 1.0000 | OROMUCOSAL | Status: DC | PRN

## 2024-01-08 MED ORDER — SENNOSIDES-DOCUSATE SODIUM 8.6-50 MG PO TABS: 1.0000 | ORAL_TABLET | Freq: Every evening | ORAL | Status: DC | PRN

## 2024-01-08 MED ORDER — SODIUM CHLORIDE 0.9% FLUSH
3.0000 mL | Freq: Two times a day (BID) | INTRAVENOUS | Status: DC
  Administered 2024-01-08 – 2024-01-19 (×17): 3 mL via INTRAVENOUS

## 2024-01-08 MED ORDER — ACETAMINOPHEN 650 MG RE SUPP: 650.0000 mg | RECTAL | Status: DC | PRN

## 2024-01-08 MED ORDER — ALBUTEROL SULFATE HFA 108 (90 BASE) MCG/ACT IN AERS: 2.0000 | INHALATION_SPRAY | Freq: Four times a day (QID) | RESPIRATORY_TRACT | Status: DC | PRN

## 2024-01-08 MED ORDER — FENTANYL CITRATE (PF) 250 MCG/5ML IJ SOLN
INTRAMUSCULAR | Status: DC | PRN
  Administered 2024-01-08: 50 ug via INTRAVENOUS
  Administered 2024-01-08: 100 ug via INTRAVENOUS
  Administered 2024-01-08: 50 ug via INTRAVENOUS

## 2024-01-08 MED ORDER — SODIUM CHLORIDE 0.9 % IV SOLN: 250.0000 mL | INTRAVENOUS | Status: AC

## 2024-01-08 MED ORDER — THROMBIN (RECOMBINANT) 5000 UNITS EX SOLR: CUTANEOUS | Status: DC | PRN

## 2024-01-08 MED ORDER — VANCOMYCIN HCL 1000 MG IV SOLR
INTRAVENOUS | Status: DC | PRN
Start: 2024-01-08 — End: 2024-01-08
  Administered 2024-01-08: 1000 mg

## 2024-01-08 MED ORDER — ADULT MULTIVITAMIN W/MINERALS CH
1.0000 | ORAL_TABLET | Freq: Every day | ORAL | Status: DC
  Administered 2024-01-09 – 2024-01-19 (×11): 1 via ORAL
  Filled 2024-01-08 (×11): qty 1

## 2024-01-08 SURGICAL SUPPLY — 38 items
BAG COUNTER SPONGE SURGICOUNT (BAG) ×2 IMPLANT
BAND RUBBER #18 3X1/16 STRL (MISCELLANEOUS) ×4 IMPLANT
BENZOIN TINCTURE PRP APPL 2/3 (GAUZE/BANDAGES/DRESSINGS) ×2 IMPLANT
BLADE CLIPPER SURG (BLADE) IMPLANT
BUR MATCHSTICK NEURO 3.0 LAGG (BURR) ×2 IMPLANT
BUR PRECISION FLUTE 5.0 (BURR) ×2 IMPLANT
DERMABOND ADVANCED .7 DNX12 (GAUZE/BANDAGES/DRESSINGS) ×2 IMPLANT
DRAPE LAPAROTOMY 100X72X124 (DRAPES) ×2 IMPLANT
DRAPE SURG 17X23 STRL (DRAPES) ×8 IMPLANT
DRESSING PEEL AND PLC PRVNA 13 (GAUZE/BANDAGES/DRESSINGS) IMPLANT
DRSG PEEL AND PLACE PREVENA 13 (GAUZE/BANDAGES/DRESSINGS) ×1 IMPLANT
ELECT BLADE 4.0 EZ CLEAN MEGAD (MISCELLANEOUS) ×1 IMPLANT
ELECT REM PT RETURN 9FT ADLT (ELECTROSURGICAL) ×1 IMPLANT
ELECTRODE BLDE 4.0 EZ CLN MEGD (MISCELLANEOUS) ×2 IMPLANT
ELECTRODE REM PT RTRN 9FT ADLT (ELECTROSURGICAL) ×2 IMPLANT
GAUZE 4X4 16PLY ~~LOC~~+RFID DBL (SPONGE) IMPLANT
GLOVE ECLIPSE 6.5 STRL STRAW (GLOVE) ×2 IMPLANT
GLOVE INDICATOR 7.5 STRL GRN (GLOVE) IMPLANT
GOWN STRL REUS W/ TWL LRG LVL3 (GOWN DISPOSABLE) IMPLANT
GOWN STRL REUS W/ TWL XL LVL3 (GOWN DISPOSABLE) ×2 IMPLANT
GOWN STRL REUS W/TWL 2XL LVL3 (GOWN DISPOSABLE) IMPLANT
KIT BASIN OR (CUSTOM PROCEDURE TRAY) ×2 IMPLANT
KIT TURNOVER KIT B (KITS) ×2 IMPLANT
NDL HYPO 21X1.5 SAFETY (NEEDLE) IMPLANT
NDL HYPO 22X1.5 SAFETY MO (MISCELLANEOUS) ×2 IMPLANT
NEEDLE HYPO 21X1.5 SAFETY (NEEDLE) IMPLANT
NEEDLE HYPO 22X1.5 SAFETY MO (MISCELLANEOUS) ×1 IMPLANT
NS IRRIG 1000ML POUR BTL (IV SOLUTION) ×2 IMPLANT
PACK LAMINECTOMY NEURO (CUSTOM PROCEDURE TRAY) ×2 IMPLANT
PAD ARMBOARD POSITIONER FOAM (MISCELLANEOUS) ×6 IMPLANT
PATTIES SURGICAL .5 X1 (DISPOSABLE) IMPLANT
SPONGE SURGIFOAM ABS GEL SZ50 (HEMOSTASIS) ×2 IMPLANT
SUT VIC AB 0 CT1 18XCR BRD8 (SUTURE) IMPLANT
SUT VIC AB 1 CT1 18XBRD ANBCTR (SUTURE) ×4 IMPLANT
SUT VIC AB 3-0 SH 8-18 (SUTURE) ×4 IMPLANT
TOWEL GREEN STERILE (TOWEL DISPOSABLE) ×2 IMPLANT
TOWEL GREEN STERILE FF (TOWEL DISPOSABLE) ×2 IMPLANT
WATER STERILE IRR 1000ML POUR (IV SOLUTION) ×2 IMPLANT

## 2024-01-08 NOTE — Op Note (Signed)
 01/08/2024  8:10 PM  PATIENT:  [name]  63 y.o. female With thoracic stenosis at T9/10 PRE-OPERATIVE DIAGNOSIS:  Spinal stenosis, thoracic region T9/10  POST-OPERATIVE DIAGNOSIS:  Spinal stenosis, thoracic region T9/10  PROCEDURE:  Procedure(s): THORACIC NINE-TEN LAMINECTOMY AND DECOMPRESSION  SURGEON:   Surgeon(s): Coletta Memos, MD  ASSISTANTS:none  ANESTHESIA:   general  EBL:  Total I/O In: -  Out: 100 [Blood:100]  BLOOD ADMINISTERED:none  CELL SAVER GIVEN:not used  COUNT:per nursing  DRAINS:  Incisional wound vac    SPECIMEN:  No Specimen  DICTATION: [name] was taken to the operating room, intubated and placed under a general anesthetic without difficulty. She was positioned prone on a Wilson frame with all pressure points padded. Her back was prepped and draped in a sterile manner. I opened the skin with a 10 blade and carried the dissection down to the thoracolumbar fascia. I used both sharp dissection and the monopolar cautery to expose the lamina of T9, and 10. I confirmed my location with an intraoperative by exposing the pedicle screws placed at T10. I used the drill, Kerrison punches, and curettes to perform a laminectomy of T9, and 10. I used the punches to remove the ligamentum flavum to expose the thecal sac. I started  decompression of the spinal canal and thecal sac at 9 and 10. I inspected the spinal canal and felt it was well decompressed. I explored rostrally, laterally, medially, and caudally and was satisfied with the decompression. I irrigated the wound, then closed in layers. I approximated the thoracolumbar fascia, subcutaneous, and subcuticular planes with vicryl sutures. I used dermabond for a sterile dressing.   PLAN OF CARE: Admit for overnight observation  PATIENT DISPOSITION:  PACU - hemodynamically stable.   Delay start of Pharmacological VTE agent (>24hrs) due to surgical blood loss or risk of bleeding:  no

## 2024-01-08 NOTE — Anesthesia Preprocedure Evaluation (Addendum)
 Anesthesia Evaluation  Patient identified by MRN, date of birth, ID band Patient awake    Reviewed: Allergy & Precautions, NPO status , Patient's Chart, lab work & pertinent test results, reviewed documented beta blocker date and time   Airway Mallampati: II  TM Distance: >3 FB Neck ROM: Full    Dental  (+) Dental Advisory Given, Edentulous Lower, Edentulous Upper   Pulmonary asthma , former smoker   Pulmonary exam normal breath sounds clear to auscultation       Cardiovascular hypertension, Pt. on medications and Pt. on home beta blockers (-) angina (-) Past MI Normal cardiovascular exam Rhythm:Regular Rate:Normal     Neuro/Psych  Headaches PSYCHIATRIC DISORDERS Anxiety Depression    Spinal stenosis, thoracic region   Neuromuscular disease    GI/Hepatic Neg liver ROS, hiatal hernia,GERD  Medicated,,  Endo/Other   Hyperthyroidism Obesity   Renal/GU negative Renal ROS     Musculoskeletal  (+) Arthritis ,  Fibromyalgia -  Abdominal   Peds  Hematology negative hematology ROS (+)   Anesthesia Other Findings Day of surgery medications reviewed with the patient.  Reproductive/Obstetrics                             Anesthesia Physical Anesthesia Plan  ASA: 2  Anesthesia Plan: General   Post-op Pain Management: Tylenol PO (pre-op)*   Induction: Intravenous  PONV Risk Score and Plan: 3 and Midazolam, Dexamethasone and Ondansetron  Airway Management Planned: Oral ETT  Additional Equipment:   Intra-op Plan:   Post-operative Plan: Extubation in OR  Informed Consent: I have reviewed the patients History and Physical, chart, labs and discussed the procedure including the risks, benefits and alternatives for the proposed anesthesia with the patient or authorized representative who has indicated his/her understanding and acceptance.     Dental advisory given  Plan Discussed with:  CRNA  Anesthesia Plan Comments: (2nd PIV after induction )       Anesthesia Quick Evaluation

## 2024-01-08 NOTE — Plan of Care (Signed)

## 2024-01-08 NOTE — Anesthesia Postprocedure Evaluation (Signed)
 Anesthesia Post Note  Patient: [name]  Procedure(s) Performed: THORACIC NINE-TEN LAMINECTOMY AND DECOMPRESSION (Spine Thoracic)     Patient location during evaluation: PACU Anesthesia Type: General Level of consciousness: awake and alert Pain management: pain level controlled Vital Signs Assessment: post-procedure vital signs reviewed and stable Respiratory status: spontaneous breathing, nonlabored ventilation, respiratory function stable and patient connected to nasal cannula oxygen Cardiovascular status: blood pressure returned to baseline and stable Postop Assessment: no apparent nausea or vomiting Anesthetic complications: no  No notable events documented.  Last Vitals:  Vitals:   01/08/24 2030 01/08/24 2053  BP: (!) 156/79 139/78  Pulse: 79 78  Resp: 17 18  Temp: 36.7 C (!) 36.3 C  SpO2: 96% 96%                    Beryle Lathe

## 2024-01-08 NOTE — H&P (Signed)
 [name] is being admitted due to thoracic stenosis at T9/10.  Allergies  Allergen Reactions   Monosodium Glutamate Anaphylaxis and Swelling    Eyes swollen shut, facial swelling, tongue swelling.   Shellfish Allergy Anaphylaxis   Baclofen Nausea Only    Dizziness and increase muscle spasm   Celebrex [Celecoxib] Itching    Only allergic to generic brand   Contrast Media [Iodinated Contrast Media] Itching and Nausea Only    "could not walk"   Diclofenac Itching    Generic Diclefenac gel causes itching. Can take the name brand Voltaren gel   Gadolinium     Allergic to MRI contrast dye per patient.   Other Other (See Comments)    Pet dander,        Latex Itching and Rash    Latex glove with powder; bite blocks used for EGD studies   Family History  Problem Relation Age of Onset   Stroke Father        passed 2004 from pneumonia   Alcohol abuse Father    Pulmonary embolism Mother        after minor knee surgery leading to DVT   Arthritis Sister    Depression Sister    Hypertension Sister    Diabetes Other        grandmother   Anesthesia problems Neg Hx    Hypotension Neg Hx    Malignant hyperthermia Neg Hx    Pseudochol deficiency Neg Hx    Social History   Socioeconomic History   Marital status: Divorced    Spouse name: Not on file   Number of children: 1   Years of education: Not on file   Highest education level: Not on file  Occupational History   Not on file  Tobacco Use   Smoking status: Former    Current packs/day: 0.00    Average packs/day: 0.5 packs/day for 10.0 years (5.0 ttl pk-yrs)    Types: Cigarettes    Start date: 12/05/1988    Quit date: 12/06/1998    Years since quitting: 25.1   Smokeless tobacco: Never  Vaping Use   Vaping status: Never Used  Substance and Sexual Activity   Alcohol use: No   Drug use: No   Sexual activity: Not Currently    Birth control/protection: Post-menopausal  Other Topics Concern   Not on file  Social History  Narrative   Divorced. 1 son.    Disabled from fibromyalgia, arthritis, back and knee surgeries.    Graduated from High school.       Sister is a patient in our clinic, [name].    Recently cared for at [facility]. Changed to [place] as new network.    Social Drivers of Corporate investment banker Strain: Not on file  Food Insecurity: No Food Insecurity (12/31/2023)   Hunger Vital Sign    Worried About Running Out of Food in the Last Year: Never true    Ran Out of Food in the Last Year: Never true  Transportation Needs: No Transportation Needs (12/31/2023)   PRAPARE - Administrator, Civil Service (Medical): No    Lack of Transportation (Non-Medical): No  Physical Activity: Not on file  Stress: Not on file  Social Connections: Not on file  Intimate Partner Violence: Not At Risk (12/31/2023)   Humiliation, Afraid, Rape, and Kick questionnaire    Fear of Current or Ex-Partner: No    Emotionally Abused: No    Physically  Abused: No    Sexually Abused: No   Prior to Admission medications   Medication Sig Start Date End Date Taking? Authorizing Provider  Acetaminophen (TYLENOL PO) Take 1 tablet by mouth as needed.   Yes [provider]  amLODipine (NORVASC) 5 MG tablet Take 1 tablet (5 mg total) by mouth daily. Patient taking differently: Take 5 mg by mouth in the morning. 12/04/23  Yes Mdala-Gausi, Gwenette Greet, MD  ascorbic acid (VITAMIN C) 500 MG tablet Take 500 mg by mouth in the morning.   Yes [provider]  aspirin EC 81 MG tablet Take 81 mg by mouth in the morning.   Yes [provider]  atenolol (TENORMIN) 50 MG tablet Take 1 tablet (50 mg total) by mouth daily. Patient taking differently: Take 50 mg by mouth in the morning. 03/24/20  Yes Medina-Vargas, Monina C, NP  chlorthalidone (HYGROTON) 25 MG tablet Take 1 tablet (25 mg total) by mouth daily. Patient taking differently: Take 25 mg by mouth in the morning. 12/04/23  Yes  Mdala-Gausi, Gwenette Greet, MD  chlorzoxazone (PARAFON) 500 MG tablet Take 1 tablet (500 mg total) by mouth 3 (three) times daily. Patient taking differently: Take 500 mg by mouth 4 (four) times daily. 03/24/20  Yes Medina-Vargas, Monina C, NP  Cholecalciferol (VITAMIN D-3 PO) Take 1 tablet by mouth in the morning.   Yes [provider]  cyanocobalamin (VITAMIN B12) 1000 MCG tablet Take 1,000 mcg by mouth once a week.   Yes [provider]  diazepam (VALIUM) 5 MG tablet Take 1 tablet (5 mg total) by mouth 2 (two) times daily. Patient taking differently: Take 5 mg by mouth See admin instructions. Take 5 mg at bedtime and additional 5 mg if needed for anxiety 03/22/20  Yes Medina-Vargas, Monina C, NP  diclofenac Sodium (VOLTAREN) 1 % GEL Apply 2 g topically 4 (four) times daily as needed (pain).   Yes [provider]  diphenhydrAMINE (BENADRYL) 25 MG tablet Take 50 mg by mouth in the morning, at noon, in the evening, and at bedtime.   Yes [provider]  ferrous sulfate 325 (65 FE) MG EC tablet Take 325 mg by mouth in the morning.   Yes [provider]  gabapentin (NEURONTIN) 600 MG tablet Take 2 tablets (1,200 mg total) by mouth 3 (three) times daily. 03/24/20  Yes Medina-Vargas, Monina C, NP  lubiprostone (AMITIZA) 24 MCG capsule Take 1 capsule (24 mcg total) by mouth 2 (two) times daily with a meal. Patient taking differently: Take 48 mcg by mouth daily before lunch. 03/24/20  Yes Medina-Vargas, Monina C, NP  methocarbamol (ROBAXIN) 500 MG tablet Take 500 mg by mouth 4 (four) times daily.   Yes [provider]  Multiple Minerals-Vitamins (CALCIUM-MAGNESIUM-ZINC-D3 PO) Take 1 tablet by mouth in the morning, at noon, and at bedtime.   Yes [provider]  omeprazole (PRILOSEC) 20 MG capsule Take 20 mg by mouth in the morning and at bedtime.    Yes [provider]  ondansetron (ZOFRAN-ODT) 4 MG disintegrating tablet Take 4 mg by mouth  every 6 (six) hours as needed. 12/25/23  Yes [provider]  oxyCODONE-acetaminophen (PERCOCET) 10-325 MG tablet Take 1 tablet by mouth every 6 (six) hours as needed for pain. 12/04/23  Yes Mdala-Gausi, Gwenette Greet, MD  albuterol (VENTOLIN HFA) 108 (90 Base) MCG/ACT inhaler Inhale 2 puffs into the lungs every 6 (six) hours as needed for wheezing or shortness of breath. 03/24/20   Medina-Vargas,  Monina C, NP  ceFEPIme 2 g in sodium chloride 0.9 % 100 mL Inject 2 g into the vein every 8 (eight) hours. 12/31/23   Narda Bonds, MD  celecoxib (CELEBREX) 200 MG capsule Take 200 mg by mouth 2 (two) times daily.    [provider]  NARCAN 4 MG/0.1ML LIQD nasal spray kit Place 1 spray into the nose once. Patient taking differently: Place 1 spray into the nose once as needed (opioid overdose). 05/26/20   [provider]   Past Surgical History:  Procedure Laterality Date   ANTERIOR CERVICAL DECOMP/DISCECTOMY FUSION N/A 12/08/2020   Procedure: Cervical Four-Five Anterior cervical decompression/discectomy/fusion;  Surgeon: Coletta Memos, MD;  Location: [facility] OR;  Service: Neurosurgery;  Laterality: N/A;  anterior   ANTERIOR LUMBAR FUSION  09/20/2011   Procedure: ANTERIOR LUMBAR FUSION 1 LEVEL;  Surgeon: Carmela Hurt;  Location: [facility];  Service: Neurosurgery;  Laterality: N/A;  Lumbar five-Sacral One Anterior Lumbar Interbody Fusion /Dr. Early to Approach    APPLICATION OF WOUND VAC N/A 05/14/2022   Procedure: Removal of wound vac with closure of wound;  Surgeon: Coletta Memos, MD;  Location[facility] OR;  Service: Neurosurgery;  Laterality: N/A;  Pt to be admitted on 05-12-2022   CERVICAL DISC SURGERY     WITH TITANIUM PLATE IN NECK---LEFT SIDE   CERVICAL SPINE SURGERY  03/24/2019   CESAREAN SECTION     EYE SURGERY Bilateral 2023   cataract   fibroidectomy     FRACTURE SURGERY  05/08/2010   Jones fracture left foot fifth metatarsal   HARDWARE REMOVAL Right 05/28/2016    Procedure: RIGHT ANKLE HARDWARE REMOVAL;  Surgeon: Salvatore Marvel, MD;  Location: [facility];  Service: Orthopedics;  Laterality: Right;   HARDWARE REMOVAL N/A 07/28/2020   Procedure: Removal of Lumbar Hardware;  Surgeon: Coletta Memos, MD;  Location: [facility] OR;  Service: Neurosurgery;  Laterality: N/A;   HERNIA REPAIR     hiatal hernia   IR THORACIC DISC ASPIRATION W/IMG GUIDE  10/29/2023   IRRIGATION AND DEBRIDEMENT KNEE  04/09/2012   Procedure: IRRIGATION AND DEBRIDEMENT KNEE;  Surgeon: Thera Flake., MD;  Location: [facility] OR;  Service: Orthopedics;  Laterality: Right;   JOINT REPLACEMENT  01/27/2012   left total knee and Right total knee   KNEE SURGERY  2005   Left knee arthroscopy   LAMINECTOMY WITH POSTERIOR LATERAL ARTHRODESIS LEVEL 4 Bilateral 03/22/2022   Procedure: Thoracic ten to Lumbar three Posterior lateral arthrodesis with screws;  Surgeon: Coletta Memos, MD;  Location: [facility] OR;  Service: Neurosurgery;  Laterality: Bilateral;   LUMBAR WOUND DEBRIDEMENT N/A 04/08/2022   Procedure: LUMBAR WOUND DEBRIDEMENT;  Surgeon: Coletta Memos, MD;  Location: [facility] OR;  Service: Neurosurgery;  Laterality: N/A;   NASAL SEPTOPLASTY W/ TURBINOPLASTY  2007   due to recurrent sinusitis   ORIF ANKLE FRACTURE Right 06/21/2015   Procedure: OPEN REDUCTION INTERNAL FIXATION RIGHT DISTAL FIBULA  FRACTURE AND OPEN REDUCTION INTERNAL FIXATION SYNDESMOSIS ;  Surgeon: Salvatore Marvel, MD;  Location: [facility];  Service: Orthopedics;  Laterality: Right;   PARTIAL HYSTERECTOMY     SHOULDER ARTHROSCOPY W/ ROTATOR CUFF REPAIR Right    SHOULDER SURGERY Left    SPINE SURGERY  2004   Cervical plate, ACDF   STERIOD INJECTION  01/27/2012   Procedure: STEROID INJECTION;  Surgeon: Nilda Simmer, MD;  Location: [facility] OR;  Service: Orthopedics;  Laterality: Right;   TEE WITHOUT CARDIOVERSION N/A 03/01/2020  Procedure: TRANSESOPHAGEAL ECHOCARDIOGRAM (TEE);  Surgeon: Jake Bathe, MD;  Location: [facility]  ENDOSCOPY;  Service: Cardiovascular;  Laterality: N/A;   THORACIC DISCECTOMY  02/16/2020   Procedure: Thoracic ten-eleven Discectomy;  Surgeon: Coletta Memos, MD;  Location: [facility] OR;  Service: Neurosurgery;;   TONSILLECTOMY  2007   TOTAL KNEE ARTHROPLASTY  01/27/2012   Procedure: TOTAL KNEE ARTHROPLASTY;  Surgeon: Nilda Simmer, MD;  Bilateral   TOTAL KNEE ARTHROPLASTY  04/06/2012   Procedure: TOTAL KNEE ARTHROPLASTY;  Surgeon: Nilda Simmer, MD;  Location: [facility] OR;  Service: Orthopedics;  Laterality: Right;   TUBAL LIGATION     UTERINE FIBROID SURGERY     mid 200s   WOUND EXPLORATION N/A 02/27/2020   Procedure: WOUND EXPLORATION;  Surgeon: Coletta Memos, MD;  Location: [facility] OR;  Service: Neurosurgery;  Laterality: N/A;,  (wound vac upper back)   WOUND EXPLORATION N/A 03/30/2022   Procedure: THORACIC WOUND EXPLORATION WITH REPAIR OF CSF LEAK;  Surgeon: Coletta Memos, MD;  Location: [facility] OR;  Service: Neurosurgery;  Laterality: N/A;   Past Medical History:  Diagnosis Date   Anemia    Ankle syndesmosis disruption    Anxiety    takes Ativan and Valium, after mother passed   Arthritis    bilateral knees s/p knee replacement bilaterally   Asthma    Bronchitis    Bruising    pt states unexplained d/t fibromyalgia   Chronic back pain    2012 tailbone surgery and 3 lower discs.    Closed fracture of distal end of right fibula and tibia    Depression    from Fibromyalgia diagnosis; not taking medicine. since 2001   Dizziness    rarely   Fibromyalgia    diagnosed 2001   GERD (gastroesophageal reflux disease)    Prilosec occasionally   Headache(784.0)    "sinus headaches"   History of hiatal hernia    Hypertension    since 2013   Hyperthyroidism    subclinical, no treatment; thyroid nodules   IBS (irritable bowel syndrome)    Impaired memory    states from fibromyalgia   Insomnia    takes Ambien   Jones fracture    left foot fifth metatarsal   Multiple allergies    including  latex, pet dander, shellfish, pet dander   Painful orthopaedic hardware right ankle 05/27/2016   Seasonal allergies    Shortness of breath    Occasional with exertion;    Sore gums    this is why pt is on Amoxil-only takes for dental work   Tachycardia    Thyroid goiter    Varicose vein    protrudes above skin-per pt;vein popped and bruised;ultrasound done to make sure that there were no clots;noclots were found  Physical Exam Constitutional:      Appearance: Normal appearance. She is obese.  HENT:     Head: Normocephalic and atraumatic.     Right Ear: External ear normal.     Left Ear: External ear normal.     Nose: Nose normal.     Mouth/Throat:     Mouth: Mucous membranes are moist.     Pharynx: Oropharynx is clear.  Eyes:     Extraocular Movements: Extraocular movements intact.     Pupils: Pupils are equal, round, and reactive to light.  Cardiovascular:     Rate and Rhythm: Normal rate and regular rhythm.  Pulmonary:     Effort: Pulmonary effort is normal.     Breath sounds:  Normal breath sounds.  Abdominal:     General: Abdomen is flat.     Palpations: Abdomen is soft.  Musculoskeletal:     Cervical back: Normal range of motion and neck supple.  Neurological:     Mental Status: She is alert and oriented to person, place, and time.     Cranial Nerves: Cranial nerves 2-12 are intact.     Sensory: Sensation is intact.     Motor: Weakness present.     Coordination: Coordination is intact.     Gait: Gait abnormal.     Comments: Weakness in the lower extremities bilaterally.   Psychiatric:        Mood and Affect: Mood normal.        Behavior: Behavior normal.        Thought Content: Thought content normal.        Judgment: Judgment normal.     Admit for thoracic laminectomy and decompression at T9/10. Risks and benefits including bleeding, infection, damage to the spinal cord, paralysis, no relief, need for further surgery. She understands and wishes to proceed.

## 2024-01-08 NOTE — Transfer of Care (Signed)
 Immediate Anesthesia Transfer of Care Note  Patient: [name]  Procedure(s) Performed: THORACIC NINE-TEN LAMINECTOMY AND DECOMPRESSION (Spine Thoracic)  Patient Location: PACU  Anesthesia Type:General  Level of Consciousness: drowsy  Airway & Oxygen Therapy: Patient Spontanous Breathing and Patient connected to nasal cannula oxygen  Post-op Assessment: Report given to RN and Post -op Vital signs reviewed and stable  Post vital signs: Reviewed and stable  Last Vitals:  Vitals Value Taken Time  BP 154/75 01/08/24 1954  Temp 98   Pulse 84 01/08/24 1956  Resp 23 01/08/24 1956  SpO2 97 % 01/08/24 1956  Vitals shown include unfiled device data.  Last Pain:  Vitals:   01/08/24 1328  TempSrc:   PainSc: 5          Complications: No notable events documented.

## 2024-01-08 NOTE — Anesthesia Procedure Notes (Signed)
 Procedure Name: Intubation Date/Time: 01/08/2024 5:40 PM  Performed by: Colbert Coyer, CRNAPre-anesthesia Checklist: Patient identified, Emergency Drugs available, Suction available and Patient being monitored Patient Re-evaluated:Patient Re-evaluated prior to induction Oxygen Delivery Method: Circle System Utilized Preoxygenation: Pre-oxygenation with 100% oxygen Induction Type: IV induction Ventilation: Mask ventilation without difficulty Laryngoscope Size: Mac and 4 Grade View: Grade I Tube type: Oral Tube size: 7.0 mm Number of attempts: 1 Airway Equipment and Method: Stylet Placement Confirmation: ETT inserted through vocal cords under direct vision, positive ETCO2 and breath sounds checked- equal and bilateral Secured at: 22 cm Tube secured with: Tape Dental Injury: Teeth and Oropharynx as per pre-operative assessment

## 2024-01-09 ENCOUNTER — Encounter (HOSPITAL_COMMUNITY): Payer: Self-pay | Admitting: Neurosurgery

## 2024-01-09 LAB — GLUCOSE, CAPILLARY
Glucose-Capillary: 97 mg/dL (ref 70–99)
Glucose-Capillary: 102 mg/dL — ABNORMAL HIGH (ref 70–99)

## 2024-01-09 MED ORDER — CHLORHEXIDINE GLUCONATE CLOTH 2 % EX PADS
6.0000 | MEDICATED_PAD | Freq: Every day | CUTANEOUS | Status: DC
  Administered 2024-01-09 – 2024-01-19 (×10): 6 via TOPICAL

## 2024-01-09 MED ORDER — CHLORHEXIDINE GLUCONATE CLOTH 2 % EX PADS: 6.0000 | MEDICATED_PAD | Freq: Every day | CUTANEOUS | Status: DC

## 2024-01-09 MED FILL — Thrombin For Soln 5000 Unit: CUTANEOUS | Qty: 2 | Status: AC

## 2024-01-09 NOTE — Progress Notes (Signed)
   Inpatient Rehab Admissions Coordinator :  Per therapy recommendations, patient was screened for CIR candidacy by Ottie Glazier RN MSN.  At this time patient appears to be a potential candidate for CIR. Noted recent SNF, limited caregiver supports and recent falls. I will place a rehab consult per protocol for full assessment. Please call me with any questions.  Ottie Glazier RN MSN Admissions Coordinator [phone]

## 2024-01-09 NOTE — Evaluation (Signed)
 Physical Therapy Evaluation Patient Details Name: ELYSSE [name] MRN: [id number] DOB: 05-Feb-1961 Today's Date: 01/09/2024  History of Present Illness  Pt admitted 4/3 for T9-10 laminectomy in setting of stenosis. Recent admission 2/19-2/27 due to possible discitis and underwent T9-T10 aspiration. Presented 3/25-2/26 for PICC line replacement after accidental dislodgement. PMH: anemia, anxiety, fibromyalgia, GERD, HTN, hyperthyroidism, IBS, ACDF 3/4, Posterior lateral arthrodesis 6/16, CSF leak 6/24.   Clinical Impression  Pt in bed upon arrival of PT, agreeable to evaluation at this time. Prior to admission the pt was living alone, with intermittent HH services, ambulating with use of walker, and reports multiple recent falls. The pt has assist from various family members and friends, and is hopeful to return to ambulating with less risk of falls and less pain. The pt was able to complete bed mobility with cues for log roll, sit-stand transfers, and small lateral steps with modA and use of RW, she had no overt buckling in RLE, but is dependent on BUE support and external support to maintain balance. Pt then able to progress to short bout of walking in the room  with chair follow and use of RW. Will continue to benefit from skilled PT acutely and after d/c to maximize functional recovery and return to independence. Pt hopeful that with short bout of intensive therapies, she could return home at modI level with much greater safety in the home.          If plan is discharge home, recommend the following: A lot of help with walking and/or transfers;A lot of help with bathing/dressing/bathroom;Assistance with cooking/housework;Assist for transportation;Help with stairs or ramp for entrance   Can travel by private vehicle        Equipment Recommendations None recommended by PT  Recommendations for Other Services  Rehab consult    Functional Status Assessment Patient has had a recent decline in  their functional status and demonstrates the ability to make significant improvements in function in a reasonable and predictable amount of time.     Precautions / Restrictions Precautions Precautions: Fall;Back Precaution Booklet Issued: Yes (comment) Precaution/Restrictions Comments: spinal wound vac not functioning, RN alerted Restrictions Weight Bearing Restrictions Per Provider Order: No      Mobility  Bed Mobility Overal bed mobility: Needs Assistance   Rolling: Used rails, Mod assist Sidelying to sit: Mod assist, HOB elevated, Used rails       General bed mobility comments: verbal cues for log roll, pt assisting with LE movement. modA at shoulder to elevate head    Transfers Overall transfer level: Needs assistance Equipment used: Rolling walker (2 wheels) Transfers: Sit to/from Stand, Bed to chair/wheelchair/BSC Sit to Stand: Mod assist, From elevated surface   Step pivot transfers: Mod assist       General transfer comment: modA to rise and steady, no buckling RLE, cues for hand placement. slow to rise.    Ambulation/Gait Ambulation/Gait assistance: Mod assist, +2 safety/equipment Gait Distance (Feet): 3 Feet (+ 8 ft) Assistive device: Rolling walker (2 wheels) Gait Pattern/deviations: Step-to pattern, Decreased stride length, Decreased dorsiflexion - right Gait velocity: decreased Gait velocity interpretation: <1.31 ft/sec, indicative of household ambulator   General Gait Details: small steps with minimal clearance. no overt buckling RLE, pt heavily dependent on RW    Balance Overall balance assessment: Needs assistance, History of Falls Sitting-balance support: Feet supported, No upper extremity supported Sitting balance-[name]: Fair     Standing balance support: Bilateral upper extremity supported, During functional activity Standing balance-[name]:  Poor                               Pertinent Vitals/Pain Pain  Assessment Pain Assessment: Faces Faces Pain Scale: Hurts a little bit Pain Location: back Pain Descriptors / Indicators: Grimacing, Guarding Pain Intervention(s): Limited activity within patient's tolerance, Monitored during session, Premedicated before session, Repositioned    Home Living Family/patient expects to be discharged to:: Private residence Living Arrangements: Alone Available Help at Discharge: Family;Friend(s);Available PRN/intermittently Type of Home: House Home Access: Stairs to enter Entrance Stairs-Rails: Right;Left;Can reach both Entrance Stairs-Number of Steps: 5   Home Layout: One level Home Equipment: Shower seat;Hand held Programmer, systems (2 wheels);Rollator (4 wheels);Grab bars - tub/shower;Cane - single point;Adaptive equipment;BSC/3in1;Crutches;Tub bench      Prior Function Prior Level of Function : History of Falls (last six months);Driving;Needs assist             Mobility Comments: Mod I with rollator, cane or crutches during prior admission; but endorses frequent falls. pt reports limited OOB mobility since DC home from SNF (reports therapy only worked on standing and stepping at bedside, not walking so pt unable to walk when discharged home) ADLs Comments: Unclear on ADL mgmt since DC from SNF. pt reports due to weakness, was using [facility] for toileting and neighbors and family bringing meals to pt.     Extremity/Trunk Assessment   Upper Extremity Assessment Upper Extremity Assessment: Defer to OT evaluation    Lower Extremity Assessment Lower Extremity Assessment: Generalized weakness;RLE deficits/detail RLE Deficits / Details: pt reports difficulty moving and slighlty numb, grossly 4-/5 to MMT, weakest in knee flexion. no buckling with gait RLE Sensation: decreased light touch    Cervical / Trunk Assessment Cervical / Trunk Assessment: Back Surgery;Other exceptions Cervical / Trunk Exceptions: increased body habitus  Communication    Communication Communication: No apparent difficulties    Cognition Arousal: Alert Behavior During Therapy: WFL for tasks assessed/performed   PT - Cognitive impairments: No family/caregiver present to determine baseline                       PT - Cognition Comments: pt following commands, able to answer questions. not formally assessed. pt managing well in distracting environment Following commands: Intact       Cueing Cueing Techniques: Verbal cues, Gestural cues     General Comments General comments (skin integrity, edema, etc.): VSS on RA, wound vac on spinal incision not sealed, RN arrived, attempted to tape but still alarming    Exercises General Exercises - Lower Extremity Ankle Circles/Pumps: AROM, Both, 10 reps, Seated Quad Sets: AROM, Both, 5 reps, Seated Long Arc Quad: AROM, Both, 10 reps, Seated Heel Raises: AROM, Both, 10 reps, Seated   Assessment/Plan    PT Assessment Patient needs continued PT services  PT Problem List Decreased strength;Decreased activity tolerance;Decreased balance;Decreased mobility;Decreased coordination;Decreased knowledge of precautions       PT Treatment Interventions DME instruction;Gait training;Stair training;Functional mobility training;Therapeutic activities;Therapeutic exercise;Balance training;Patient/family education    PT Goals (Current goals can be found in the Care Plan section)  Acute Rehab PT Goals Patient Stated Goal: return to independence with better safety PT Goal Formulation: With patient Time For Goal Achievement: 01/23/24 Potential to Achieve Goals: Good    Frequency Min 5X/week        AM-PAC PT "6 Clicks" Mobility  Outcome Measure Help needed turning from your back to your  side while in a flat bed without using bedrails?: A Lot Help needed moving from lying on your back to sitting on the side of a flat bed without using bedrails?: A Lot Help needed moving to and from a bed to a chair (including  a wheelchair)?: A Lot Help needed standing up from a chair using your arms (e.g., wheelchair or bedside chair)?: A Lot Help needed to walk in hospital room?: Total (<20 ft) Help needed climbing 3-5 steps with a railing? : Total 6 Click Score: 10    End of Session Equipment Utilized During Treatment: Gait belt Activity Tolerance: Patient tolerated treatment well;Patient limited by fatigue Patient left: in chair;with call bell/phone within reach;with chair alarm set Nurse Communication: Mobility status PT Visit Diagnosis: Unsteadiness on feet (R26.81);Other abnormalities of gait and mobility (R26.89);Repeated falls (R29.6);Muscle weakness (generalized) (M62.81);Pain Pain - Right/Left: Right Pain - part of body:  (back)    Time: 1133-1206 PT Time Calculation (min) (ACUTE ONLY): 33 min   Charges:   PT Evaluation $PT Eval Moderate Complexity: 1 Mod PT Treatments $Gait Training: 8-22 mins PT General Charges $$ ACUTE PT VISIT: 1 Visit        Vickki Muff, PT, DPT   Acute Rehabilitation Department Office [phone] Secure Chat Communication Preferred  Ronnie Derby 01/09/2024, 1:11 PM

## 2024-01-09 NOTE — Evaluation (Signed)
 Occupational Therapy Evaluation Patient Details Name: [name] MRN: [id number] DOB: 02/14/1961 Today's Date: 01/09/2024   History of Present Illness   Pt admitted 4/3 for T9-10 laminectomy in setting of stenosis. Recent admission 2/19-2/27 due to possible discitis and underwent T9-T10 aspiration. Presented 3/25-2/26 for PICC line replacement after accidental dislodgement. PMH: anemia, anxiety, fibromyalgia, GERD, HTN, hyperthyroidism, IBS, ACDF 3/4, Posterior lateral arthrodesis 6/16, CSF leak 6/24.     Clinical Impressions PTA, pt lives alone, reports increasing difficulty managing ADLs, IADLs and mobility since DC home from SNF rehab. Prior to hospitalization in Feb, pt reports being ambulatory and Modified Independent with ADLs and basic household IADLs though did endorse hx of falls. Pt presents now with deficits in pain, cognition, balance and strength. Pt requires Max A for bed mobility, up to Mod-Max A for transfers using RW today. Pt requires Min A for UB ADLs and up to Total A for LB ADLs. Pt will require postacute rehab stay prior to discharge home due to current deficits w/ pt reporting an interest in AIR.     If plan is discharge home, recommend the following:   A lot of help with walking and/or transfers;Two people to help with walking and/or transfers;A lot of help with bathing/dressing/bathroom;Two people to help with bathing/dressing/bathroom     Functional Status Assessment   Patient has had a recent decline in their functional status and demonstrates the ability to make significant improvements in function in a reasonable and predictable amount of time.     Equipment Recommendations   Other (comment) (TBD)     Recommendations for Other Services   Rehab consult     Precautions/Restrictions   Precautions Precautions: Fall;Back Precaution Booklet Issued: Yes (comment) Precaution/Restrictions Comments: spinal wound vac, has R ankle aircast, no back  brace needed Restrictions Weight Bearing Restrictions Per Provider Order: No     Mobility Bed Mobility Overal bed mobility: Needs Assistance Bed Mobility: Rolling, Sidelying to Sit, Sit to Sidelying Rolling: Max assist, Used rails Sidelying to sit: Max assist     Sit to sidelying: Mod assist General bed mobility comments: cued for log rolling, cues to avoid twisting with poor carryover. Pt wanted to attempt lifting trunk without assist but unable and required heavy assist to sit upright. Able to guide torso down to bed with BLE assist    Transfers Overall transfer level: Needs assistance Equipment used: Rolling walker (2 wheels) Transfers: Sit to/from Stand Sit to Stand: Mod assist, Max assist, From elevated surface           General transfer comment: Max A for initial 2-3 attempts to stand as pt pulling on RW despite OT education to push from bed. With pt eventually pushing up with one UE, able to stand with Mod A from elevated bed with RW and complete side steps at bedside with Min A. pt declined to transfer to chair at this time.      Balance Overall balance assessment: Needs assistance, History of Falls Sitting-balance support: Feet supported, No upper extremity supported Sitting balance-[name]: Fair     Standing balance support: Bilateral upper extremity supported, During functional activity Standing balance-[name]: Poor                             ADL either performed or assessed with clinical judgement   ADL Overall ADL's : Needs assistance/impaired Eating/Feeding: Set up   Grooming: Set up;Sitting   Upper Body Bathing:  Minimal assistance;Sitting   Lower Body Bathing: Maximal assistance;Sitting/lateral leans;Sit to/from stand   Upper Body Dressing : Minimal assistance;Sitting   Lower Body Dressing: Total assistance;Sit to/from stand Lower Body Dressing Details (indicate cue type and reason): pt requesting to wear tennis shoes and  brace though unable to don due to potential LE swelling     Toileting- Clothing Manipulation and Hygiene: Total assistance;Sit to/from stand               Vision Baseline Vision/History: 1 Wears glasses Ability to See in Adequate Light: 0 Adequate Patient Visual Report: No change from baseline Vision Assessment?: No apparent visual deficits     Perception         Praxis         Pertinent Vitals/Pain Pain Assessment Pain Assessment: Faces Pain Descriptors / Indicators: Grimacing, Guarding Pain Intervention(s): Monitored during session     Extremity/Trunk Assessment Upper Extremity Assessment Upper Extremity Assessment: Generalized weakness;Right hand dominant   Lower Extremity Assessment Lower Extremity Assessment: Defer to PT evaluation   Cervical / Trunk Assessment Cervical / Trunk Assessment: Back Surgery   Communication Communication Communication: No apparent difficulties   Cognition Arousal: Alert Behavior During Therapy: WFL for tasks assessed/performed Cognition: No family/caregiver present to determine baseline             OT - Cognition Comments: pleasant, some decreased insight into safety, deficits and safe mgmt at home.                 Following commands: Intact       Cueing  General Comments   Cueing Techniques: Verbal cues;Gestural cues      Exercises     Shoulder Instructions      Home Living Family/patient expects to be discharged to:: Private residence Living Arrangements: Alone Available Help at Discharge: Family;Friend(s);Available PRN/intermittently Type of Home: House Home Access: Stairs to enter [employer] of Steps: 5 Entrance Stairs-Rails: Right;Left;Can reach both Home Layout: One level     Bathroom Shower/Tub: Chief Strategy Officer: Standard     Home Equipment: Shower seat;Hand held Programmer, systems (2 wheels);Rollator (4 wheels);Grab bars - tub/shower;Cane - single  point;Adaptive equipment;BSC/3in1;Crutches;Tub bench Adaptive Equipment: Reacher;Long-handled sponge        Prior Functioning/Environment Prior Level of Function : History of Falls (last six months);Driving;Needs assist             Mobility Comments: Mod I with rollator, cane or crutches during prior admission; but endorses frequent falls. pt reports limited OOB mobility since DC home from SNF (reports therapy only worked on standing and stepping at bedside, not walking so pt unable to walk when discharged home) ADLs Comments: Unclear on ADL mgmt since DC from SNF. pt reports due to weakness, was using [facility] for toileting and neighbors and family bringing meals to pt.    OT Problem List: Decreased strength;Impaired balance (sitting and/or standing);Decreased activity tolerance;Decreased cognition;Decreased safety awareness;Decreased knowledge of use of DME or AE;Decreased knowledge of precautions;Pain   OT Treatment/Interventions: Therapeutic exercise;Self-care/ADL training;Energy conservation;DME and/or AE instruction;Therapeutic activities;Patient/family education;Balance training      OT Goals(Current goals can be found in the care plan section)   Acute Rehab OT Goals Patient Stated Goal: wants to go to rehab at hospital OT Goal Formulation: With patient Time For Goal Achievement: 01/23/24 Potential to Achieve Goals: Good ADL Goals Pt Will Perform Lower Body Bathing: with min assist;sit to/from stand;sitting/lateral leans Pt Will Perform Lower Body Dressing: with min assist;sit  to/from stand;sitting/lateral leans Pt Will Transfer to Toilet: with supervision;stand pivot transfer;bedside commode Pt Will Perform Toileting - Clothing Manipulation and hygiene: with mod assist;sitting/lateral leans;sit to/from stand Additional ADL Goal #1: Pt to complete bed mobility with Min A in prep for EOB/OOB ADLs   OT Frequency:  Min 2X/week    Co-evaluation              AM-PAC OT "6  Clicks" Daily Activity     Outcome Measure Help from another person eating meals?: None Help from another person taking care of personal grooming?: A Little Help from another person toileting, which includes using toliet, bedpan, or urinal?: Total Help from another person bathing (including washing, rinsing, drying)?: A Lot Help from another person to put on and taking off regular upper body clothing?: A Little Help from another person to put on and taking off regular lower body clothing?: Total 6 Click Score: 14   End of Session Equipment Utilized During Treatment: Rolling walker (2 wheels);Gait belt Nurse Communication: Mobility status (discussed with nursing student)  Activity Tolerance: Patient tolerated treatment well Patient left: in bed;with call bell/phone within reach;with bed alarm set  OT Visit Diagnosis: Other abnormalities of gait and mobility (R26.89);Unsteadiness on feet (R26.81);History of falling (Z91.81)                Time: 4098-1191 OT Time Calculation (min): 46 min Charges:  OT General Charges $OT Visit: 1 Visit OT Evaluation $OT Eval Moderate Complexity: 1 Mod OT Treatments $Self Care/Home Management : 8-22 mins $Therapeutic Activity: 8-22 mins  Bradd Canary, OTR/L Acute Rehab Services Office: [phone]   Lorre Munroe 01/09/2024, 11:25 AM

## 2024-01-09 NOTE — Progress Notes (Signed)
 Called Dr. Sueanne Margarita office to notify him that the wound vac has lost suction. Left a message and he returned the call within a few minutes. Explained to him that the tape on both sides had come loose, I tried to stretch it out and I put new tape on but was not successful in getting a seal so it would have suction. He said he will bring a new wound vac to put on.

## 2024-01-10 NOTE — Progress Notes (Signed)
 Patient up out of bed this shift, pain was controlled with pain meds. Wound vac to back remains in place and draining with no difficulty, Patient transported from bed to chair with a walker and 1 person assist. Patient continues IV abt's with no adverse reaction noted.

## 2024-01-10 NOTE — Progress Notes (Signed)
 Patient ID: [name], female   DOB: 06-30-1961, 63 y.o.   MRN: [id number] Overall she appears to be doing okay.  She has appropriate back soreness.  She is moving her feet.  She has not walked to the bathroom.  Will mobilize her today with PT and OT.  CIR has been consulted.

## 2024-01-10 NOTE — Progress Notes (Signed)
 Physical Therapy Treatment Patient Details Name: [name] MRN: [id number] DOB: November 03, 1960 Today's Date: 01/10/2024   History of Present Illness Pt admitted 4/3 for T9-10 laminectomy in setting of stenosis. Recent admission 2/19-2/27 due to possible discitis and underwent T9-T10 aspiration. Presented 3/25-2/26 for PICC line replacement after accidental dislodgement. PMH: anemia, anxiety, fibromyalgia, GERD, HTN, hyperthyroidism, IBS, ACDF 3/4, Posterior lateral arthrodesis 6/16, CSF leak 6/24.    PT Comments  Pt received in bed reporting pain from her fibromyalgia. Declining amb but agreeable to OOB to recliner. Mod assist bed mobility, mod assist sit to stand, and mod assist SPT with RW. Pt in recliner with feet elevated at end of session.     If plan is discharge home, recommend the following: A lot of help with walking and/or transfers;A lot of help with bathing/dressing/bathroom;Assistance with cooking/housework;Assist for transportation;Help with stairs or ramp for entrance   Can travel by private vehicle        Equipment Recommendations  None recommended by PT    Recommendations for Other Services       Precautions / Restrictions Precautions Precautions: Fall;Back Precaution/Restrictions Comments: spinal wound vac not functioning, RN alerted     Mobility  Bed Mobility Overal bed mobility: Needs Assistance Bed Mobility: Rolling, Sidelying to Sit Rolling: Used rails, Mod assist Sidelying to sit: Mod assist, HOB elevated, Used rails       General bed mobility comments: increased time, cues for sequencing, use of bed pad to scoot to EOB    Transfers Overall transfer level: Needs assistance Equipment used: Rolling walker (2 wheels) Transfers: Sit to/from Stand, Bed to chair/wheelchair/BSC Sit to Stand: Mod assist, From elevated surface   Step pivot transfers: Mod assist       General transfer comment: increased time to power up and stabilize initial standing  balance    Ambulation/Gait                   Stairs             Wheelchair Mobility     Tilt Bed    Modified Rankin (Stroke Patients Only)       Balance Overall balance assessment: Needs assistance, History of Falls Sitting-balance support: Feet supported, No upper extremity supported Sitting balance-[name]: Poor     Standing balance support: Bilateral upper extremity supported, During functional activity, Reliant on assistive device for balance Standing balance-[name]: Poor                              Communication Communication Communication: No apparent difficulties  Cognition Arousal: Alert Behavior During Therapy: WFL for tasks assessed/performed   PT - Cognitive impairments: No apparent impairments                         Following commands: Intact      Cueing Cueing Techniques: Verbal cues, Gestural cues  Exercises      General Comments        Pertinent Vitals/Pain Pain Assessment Pain Assessment: Faces Faces Pain Scale: Hurts even more Pain Location: generalized (fibromyalgia) Pain Descriptors / Indicators: Grimacing, Guarding Pain Intervention(s): Premedicated before session, Limited activity within patient's tolerance, Monitored during session, Repositioned    Home Living                          Prior Function  PT Goals (current goals can now be found in the care plan section) Acute Rehab PT Goals Patient Stated Goal: independence Progress towards PT goals: Progressing toward goals    Frequency    Min 5X/week      PT Plan      Co-evaluation              AM-PAC PT "6 Clicks" Mobility   Outcome Measure  Help needed turning from your back to your side while in a flat bed without using bedrails?: A Lot Help needed moving from lying on your back to sitting on the side of a flat bed without using bedrails?: A Lot Help needed moving to and from a bed to a  chair (including a wheelchair)?: A Lot Help needed standing up from a chair using your arms (e.g., wheelchair or bedside chair)?: A Lot Help needed to walk in hospital room?: Total Help needed climbing 3-5 steps with a railing? : Total 6 Click Score: 10    End of Session   Activity Tolerance: Patient limited by pain Patient left: in chair;with call bell/phone within reach Nurse Communication: Mobility status PT Visit Diagnosis: Unsteadiness on feet (R26.81);Other abnormalities of gait and mobility (R26.89);Repeated falls (R29.6);Muscle weakness (generalized) (M62.81);Pain     Time: 1478-2956 PT Time Calculation (min) (ACUTE ONLY): 17 min  Charges:    $Therapeutic Activity: 8-22 mins PT General Charges $$ ACUTE PT VISIT: 1 Visit                     Ferd Glassing., PT  Office # [phone]    Ilda Foil 01/10/2024, 11:53 AM

## 2024-01-10 NOTE — Progress Notes (Signed)
 Inpatient Rehab Admissions Coordinator:  Consult received. Noted pt's status is outpatient in bed and unlikely to get insurance approval for CIR with [facility] Medicare. TOC made aware. AC will sign off.   Wolfgang Phoenix, MS, CCC-SLP Admissions Coordinator [phone]

## 2024-01-11 NOTE — Progress Notes (Signed)
 Patient ID: [name], female   DOB: November 11, 1960, 63 y.o.   MRN: [id number] Overall I think she is doing okay.  She her pain is well-controlled.  She seems to be moving her legs fairly well to an in bed exam.  Continue current management.  Continue to mobilize

## 2024-01-12 ENCOUNTER — Telehealth (INDEPENDENT_AMBULATORY_CARE_PROVIDER_SITE_OTHER): Admitting: Internal Medicine

## 2024-01-12 ENCOUNTER — Other Ambulatory Visit: Payer: Self-pay

## 2024-01-12 ENCOUNTER — Ambulatory Visit: Admitting: Internal Medicine

## 2024-01-12 DIAGNOSIS — Z91199 Patient's noncompliance with other medical treatment and regimen due to unspecified reason: Secondary | ICD-10-CM

## 2024-01-12 NOTE — Progress Notes (Signed)
 Physical Therapy Treatment Patient Details Name: [name] MRN: [id number] DOB: [birth year] Today's Date: 01/12/2024   History of Present Illness Pt admitted 4/3 for T9-10 laminectomy in setting of stenosis. Recent admission 2/19-2/27 due to possible discitis and underwent T9-T10 aspiration. Presented 3/25-2/26 for PICC line replacement after accidental dislodgement. PMH: anemia, anxiety, fibromyalgia, GERD, HTN, hyperthyroidism, IBS, ACDF 3/4, Posterior lateral arthrodesis 6/16, CSF leak 6/24.    PT Comments  The pt was agreeable to session, reports pain is under control, but she is waiting for telehealth call from infectious disease. Pt continues to present with deficits in LE strength, power, activity tolerance, and stability, but is making slow but steady progress with gait and transfers. She needed only minA to rise from elevated surface, and was able to progress walking distance to ~79ft with BUE support on RW. Pt with no buckling, but consistent toe drag with RLE despite cues for increased hip flexion and ankle DF. Recommendations remain appropriate as pt continues to present with significant weakness that makes her at great risk for continued falls.    If plan is discharge home, recommend the following: A lot of help with walking and/or transfers;A lot of help with bathing/dressing/bathroom;Assistance with cooking/housework;Assist for transportation;Help with stairs or ramp for entrance   Can travel by private vehicle        Equipment Recommendations  None recommended by PT    Recommendations for Other Services Rehab consult     Precautions / Restrictions Precautions Precautions: Fall;Back Precaution Booklet Issued: Yes (comment) Recall of Precautions/Restrictions: Impaired Precaution/Restrictions Comments: pt able to verbalize precautions, but not adhere during movement despite cues Restrictions Weight Bearing Restrictions Per Provider Order: No     Mobility  Bed  Mobility Overal bed mobility: Needs Assistance Bed Mobility: Rolling, Sidelying to Sit Rolling: Used rails, Mod assist Sidelying to sit: Mod assist, HOB elevated, Used rails       General bed mobility comments: verbal cues for log roll, pt assisting with LE movement. modA at shoulder to elevate head    Transfers Overall transfer level: Needs assistance Equipment used: Rolling walker (2 wheels) Transfers: Sit to/from Stand, Bed to chair/wheelchair/BSC Sit to Stand: Mod assist, From elevated surface   Step pivot transfers: Min assist       General transfer comment: min-modA to rise from elevated bed, assist to RW to steady. minA to manage RW and cue for stepping with pivot to chair    Ambulation/Gait Ambulation/Gait assistance: Mod assist, +2 safety/equipment Gait Distance (Feet): 16 Feet Assistive device: Rolling walker (2 wheels) Gait Pattern/deviations: Step-to pattern, Decreased stride length, Decreased dorsiflexion - right, Trunk flexed Gait velocity: decreased Gait velocity interpretation: <1.31 ft/sec, indicative of household ambulator   General Gait Details: small steps with minimal clearance. no overt buckling RLE, but toe drag noted. used ankle brace on RLE today. pt heavily dependent on RW      Balance Overall balance assessment: Needs assistance, History of Falls Sitting-balance support: Feet supported, No upper extremity supported Sitting balance-[name]: Fair     Standing balance support: Bilateral upper extremity supported, During functional activity Standing balance-[name]: Poor Standing balance comment: dependent on BUE support                            Communication Communication Communication: No apparent difficulties  Cognition Arousal: Alert Behavior During Therapy: WFL for tasks assessed/performed   PT - Cognitive impairments: No family/caregiver present to determine baseline  PT - Cognition  Comments: pt following commands, able to answer questions. not formally assessed. pt managing well in distracting environment Following commands: Intact      Cueing Cueing Techniques: Verbal cues, Gestural cues  Exercises      General Comments General comments (skin integrity, edema, etc.): VSS on RA      Pertinent Vitals/Pain Pain Assessment Pain Assessment: Faces Faces Pain Scale: Hurts a little bit Pain Location: back Pain Descriptors / Indicators: Grimacing, Guarding Pain Intervention(s): Limited activity within patient's tolerance, Monitored during session     PT Goals (current goals can now be found in the care plan section) Acute Rehab PT Goals Patient Stated Goal: return to independence with better safety PT Goal Formulation: With patient Time For Goal Achievement: 01/23/24 Potential to Achieve Goals: Good Progress towards PT goals: Progressing toward goals    Frequency    Min 5X/week       AM-PAC PT "6 Clicks" Mobility   Outcome Measure  Help needed turning from your back to your side while in a flat bed without using bedrails?: A Lot Help needed moving from lying on your back to sitting on the side of a flat bed without using bedrails?: A Lot Help needed moving to and from a bed to a chair (including a wheelchair)?: A Lot Help needed standing up from a chair using your arms (e.g., wheelchair or bedside chair)?: A Little Help needed to walk in hospital room?: Total (<20 ft) Help needed climbing 3-5 steps with a railing? : Total 6 Click Score: 11    End of Session Equipment Utilized During Treatment: Gait belt Activity Tolerance: Patient tolerated treatment well;Patient limited by fatigue Patient left: in chair;with call bell/phone within reach;with chair alarm set Nurse Communication: Mobility status PT Visit Diagnosis: Unsteadiness on feet (R26.81);Other abnormalities of gait and mobility (R26.89);Repeated falls (R29.6);Muscle weakness (generalized)  (M62.81);Pain Pain - Right/Left: Right Pain - part of body:  (back)     Time: 2841-3244 PT Time Calculation (min) (ACUTE ONLY): 25 min  Charges:    $Gait Training: 8-22 mins $Therapeutic Activity: 8-22 mins PT General Charges $$ ACUTE PT VISIT: 1 Visit                     Vickki Muff, PT, DPT   Acute Rehabilitation Department Office [phone] Secure Chat Communication Preferred   Ronnie Derby 01/12/2024, 3:34 PM

## 2024-01-12 NOTE — Plan of Care (Signed)
  Problem: Education: Goal: Knowledge of General Education information will improve Description: Including pain rating scale, medication(s)/side effects and non-pharmacologic comfort measures 01/12/2024 0445 by Cathren Harsh, RN Outcome: Progressing 01/12/2024 0439 by Cathren Harsh, RN Outcome: Progressing   Problem: Health Behavior/Discharge Planning: Goal: Ability to manage health-related needs will improve Outcome: Progressing   Problem: Clinical Measurements: Goal: Ability to maintain clinical measurements within normal limits will improve 01/12/2024 0445 by Cathren Harsh, RN Outcome: Progressing 01/12/2024 0439 by Cathren Harsh, RN Outcome: Progressing Goal: Will remain free from infection 01/12/2024 0445 by Cathren Harsh, RN Outcome: Progressing 01/12/2024 0439 by Cathren Harsh, RN Outcome: Progressing Goal: Diagnostic test results will improve 01/12/2024 0445 by Cathren Harsh, RN Outcome: Progressing 01/12/2024 0439 by Cathren Harsh, RN Outcome: Progressing Goal: Respiratory complications will improve Outcome: Progressing   Problem: Activity: Goal: Risk for activity intolerance will decrease Outcome: Progressing   Problem: Elimination: Goal: Will not experience complications related to bowel motility Outcome: Progressing

## 2024-01-12 NOTE — Progress Notes (Deleted)
 Marland Kitchen

## 2024-01-12 NOTE — TOC Initial Note (Signed)
 Transition of Care [facility]) - Initial/Assessment Note    Patient Details  Name: [name] MRN: [id number] Date of Birth: 1960/12/03  Transition [facility]) CM/SW Contact:    Lorri Frederick, LCSW Phone Number: 01/12/2024, 4:10 PM  Clinical Narrative:     CSW spoke with pt regarding DC plan.  Pt was not aware that CIR is unable to accept her, asked for clarification as to why.  CSW spoke with her about SNF as other option.  Pt states has been to SNF twice before and did not find it very helpful.  Pt is from home alone but does have quite a bit of support from family and neighbors.  [facility] currently in place.  We discussed PT note from today stating needed 2 people to help with walking.  Pt would like to speak with her son about possible DC home before making a decision.  Permission also given to CSW to speak with son Despina Hick and sister Luster Landsberg.    CSW reached out to CIR and informed them that pt requesting some further explanation.  They declined to speak with pt.                Expected Discharge Plan:  (TBD) Barriers to Discharge: Continued Medical Work up   Patient Goals and CMS Choice Patient states their goals for this hospitalization and ongoing recovery are:: get back on my feet          Expected Discharge Plan and Services In-house Referral: Clinical Social Work     Living arrangements for the past 2 months: Single Family Home                                      Prior Living Arrangements/Services Living arrangements for the past 2 months: Single Family Home Lives with:: Self Patient language and need for interpreter reviewed:: Yes Do you feel safe going back to the place where you live?: Yes      Need for Family Participation in Patient Care: Yes (Comment) Care giver support system in place?: Yes (comment) Current home services: Home RN, Home PT Criminal Activity/Legal Involvement Pertinent to Current Situation/Hospitalization: No - Comment as  needed  Activities of Daily Living   ADL Screening (condition at time of admission) Independently performs ADLs?: Yes (appropriate for developmental age) Is the patient deaf or have difficulty hearing?: No Does the patient have difficulty seeing, even when wearing glasses/contacts?: No Does the patient have difficulty concentrating, remembering, or making decisions?: No  Permission Sought/Granted Permission sought to share information with : Family Supports Permission granted to share information with : Yes, Verbal Permission Granted  Share Information with NAME: son Despina Hick, sister Renee           Emotional Assessment Appearance:: Appears stated age Attitude/Demeanor/Rapport: Engaged Affect (typically observed): Appropriate Orientation: : Oriented to Self, Oriented to Place, Oriented to  Time, Oriented to Situation      Admission diagnosis:  Spinal stenosis, thoracic region [M48.04] Thoracic stenosis [M48.04] Thoracic spinal stenosis [M48.04] Patient Active Problem List   Diagnosis Date Noted   Thoracic stenosis 01/08/2024   Thoracic spinal stenosis 01/08/2024   Obesity, Class III, BMI 40-49.9 (morbid obesity) (HCC) 12/30/2023   History of long term use of antibiotics due to prior spinal hardware infection MSSA and Enterobacter cloacae 12/30/2023   Osteomyelitis of thoracic region [facility]) 12/16/2023   Medication management 12/15/2023  Falls frequently 11/27/2023   Problem with vascular access--PICC line dislodgement 11/26/2023   T9 discitis/osteomyelitis and T9 epidural phlegmon/abscess s/p aspiration 10/29/2023 10/28/2023   Wound infection after surgery 05/14/2022   Delayed surgical wound healing 05/12/2022   Postoperative wound infection 04/08/2022   Scoliosis due to degenerative disease of spine in adult patient 03/30/2022   HNP (herniated nucleus pulposus), cervical 12/08/2020   Lumbago with sciatica, unspecified side 07/28/2020   PICC (peripherally inserted central  catheter) in place 03/16/2020   Encounter for long-term (current) use of antibiotics 03/16/2020   Polypharmacy 03/09/2020   At risk for adverse drug event 03/09/2020   MSSA bacteremia 02/29/2020   Postoperative infection 02/28/2020   Abnormal gait due to muscle weakness 02/26/2020   HNP (herniated nucleus pulposus), lumbar 02/16/2020   Muscle weakness of lower extremity 02/14/2020   Spondylolisthesis of lumbar region 10/30/2017   Painful orthopaedic hardware right ankle 05/27/2016   Closed fracture of distal end of right fibula and tibia    Ankle syndesmosis disruption    ASCUS favor benign 01/06/2014   Leg swelling 12/22/2013   Seasonal allergies    Anemia    Chronic back pain due to DJD with history surgery    Hyperthyroidism    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 03/24/2012   Status post left total prosthetic replacement of knee joint using cement 03/24/2012   Hypokalemia 01/28/2012   Fibromyalgia    GERD (gastroesophageal reflux disease)    Anxiety    Depression    Hypertension    Lumbar degenerative disc disease 09/25/2011   PCP:  Irven Coe, MD Pharmacy:   [facility] - [place], [name] - [address] [name] [address] Phone: [phone] Fax: [phone]  [id number] - [place], [name] - [address] AT [facility] [address] [name] [address] Phone: [phone] Fax: [phone]     Social Drivers of Health (SDOH) Social History: SDOH Screenings   Food Insecurity: No Food Insecurity (01/09/2024)  Housing: Low Risk  (01/09/2024)  Transportation Needs: No Transportation Needs (01/09/2024)  Utilities: Not At Risk (01/09/2024)  Depression (PHQ2-9): Low Risk  (01/12/2024)  Tobacco Use: Medium Risk (01/08/2024)   SDOH Interventions:     Readmission Risk Interventions    05/17/2022    3:02 PM  Readmission Risk Prevention Plan  Transportation  Screening Complete  PCP or Specialist Appt within 5-7 Days Complete  Home Care Screening Complete  Medication Review (RN CM) Complete

## 2024-01-12 NOTE — Plan of Care (Signed)
  Problem: Education: Goal: Knowledge of General Education information will improve Description: Including pain rating scale, medication(s)/side effects and non-pharmacologic comfort measures Outcome: Progressing   Problem: Clinical Measurements: Goal: Ability to maintain clinical measurements within normal limits will improve Outcome: Progressing Goal: Will remain free from infection Outcome: Progressing Goal: Diagnostic test results will improve Outcome: Progressing   Problem: Activity: Goal: Risk for activity intolerance will decrease Outcome: Progressing   Problem: Elimination: Goal: Will not experience complications related to bowel motility Outcome: Progressing

## 2024-01-12 NOTE — Progress Notes (Signed)
 Patient ID: [name], female   DOB: 11/10/1960, 63 y.o.   MRN: [id number] BP (!) 154/76 (BP Location: Left Arm)   Pulse 72   Temp 98.4 F (36.9 C)   Resp 17   Ht 5\' 2"  (1.575 m)   Wt 94.8 kg   LMP 11/20/2012   SpO2 100%   BMI 38.23 kg/m  Alert and oriented x 4, speech is clear and fluent. Moving all extremities well Wound vac in place and on suction Will work on placement

## 2024-01-12 NOTE — Plan of Care (Signed)

## 2024-01-13 ENCOUNTER — Ambulatory Visit (HOSPITAL_COMMUNITY)

## 2024-01-13 ENCOUNTER — Other Ambulatory Visit (HOSPITAL_COMMUNITY): Payer: Self-pay

## 2024-01-13 DIAGNOSIS — Z87892 Personal history of anaphylaxis: Secondary | ICD-10-CM | POA: Diagnosis not present

## 2024-01-13 DIAGNOSIS — M4714 Other spondylosis with myelopathy, thoracic region: Secondary | ICD-10-CM | POA: Diagnosis not present

## 2024-01-13 DIAGNOSIS — M4800 Spinal stenosis, site unspecified: Secondary | ICD-10-CM | POA: Diagnosis not present

## 2024-01-13 DIAGNOSIS — R32 Unspecified urinary incontinence: Secondary | ICD-10-CM | POA: Diagnosis not present

## 2024-01-13 DIAGNOSIS — N319 Neuromuscular dysfunction of bladder, unspecified: Secondary | ICD-10-CM | POA: Diagnosis present

## 2024-01-13 DIAGNOSIS — Z87891 Personal history of nicotine dependence: Secondary | ICD-10-CM | POA: Diagnosis not present

## 2024-01-13 DIAGNOSIS — Z823 Family history of stroke: Secondary | ICD-10-CM | POA: Diagnosis not present

## 2024-01-13 DIAGNOSIS — Z515 Encounter for palliative care: Secondary | ICD-10-CM | POA: Diagnosis not present

## 2024-01-13 DIAGNOSIS — K589 Irritable bowel syndrome without diarrhea: Secondary | ICD-10-CM | POA: Diagnosis not present

## 2024-01-13 DIAGNOSIS — M4644 Discitis, unspecified, thoracic region: Secondary | ICD-10-CM | POA: Diagnosis not present

## 2024-01-13 DIAGNOSIS — G4733 Obstructive sleep apnea (adult) (pediatric): Secondary | ICD-10-CM | POA: Diagnosis not present

## 2024-01-13 DIAGNOSIS — M797 Fibromyalgia: Secondary | ICD-10-CM | POA: Diagnosis present

## 2024-01-13 DIAGNOSIS — Z79899 Other long term (current) drug therapy: Secondary | ICD-10-CM | POA: Diagnosis not present

## 2024-01-13 DIAGNOSIS — Z8249 Family history of ischemic heart disease and other diseases of the circulatory system: Secondary | ICD-10-CM | POA: Diagnosis not present

## 2024-01-13 DIAGNOSIS — E079 Disorder of thyroid, unspecified: Secondary | ICD-10-CM | POA: Diagnosis not present

## 2024-01-13 DIAGNOSIS — I10 Essential (primary) hypertension: Secondary | ICD-10-CM | POA: Diagnosis present

## 2024-01-13 DIAGNOSIS — Z96653 Presence of artificial knee joint, bilateral: Secondary | ICD-10-CM | POA: Diagnosis present

## 2024-01-13 DIAGNOSIS — E876 Hypokalemia: Secondary | ICD-10-CM | POA: Diagnosis present

## 2024-01-13 DIAGNOSIS — G992 Myelopathy in diseases classified elsewhere: Secondary | ICD-10-CM | POA: Diagnosis present

## 2024-01-13 DIAGNOSIS — Z91041 Radiographic dye allergy status: Secondary | ICD-10-CM | POA: Diagnosis not present

## 2024-01-13 DIAGNOSIS — K592 Neurogenic bowel, not elsewhere classified: Secondary | ICD-10-CM | POA: Diagnosis present

## 2024-01-13 DIAGNOSIS — K219 Gastro-esophageal reflux disease without esophagitis: Secondary | ICD-10-CM | POA: Diagnosis not present

## 2024-01-13 DIAGNOSIS — Z981 Arthrodesis status: Secondary | ICD-10-CM | POA: Diagnosis not present

## 2024-01-13 DIAGNOSIS — E669 Obesity, unspecified: Secondary | ICD-10-CM | POA: Diagnosis present

## 2024-01-13 DIAGNOSIS — M4804 Spinal stenosis, thoracic region: Secondary | ICD-10-CM | POA: Diagnosis present

## 2024-01-13 DIAGNOSIS — J45909 Unspecified asthma, uncomplicated: Secondary | ICD-10-CM | POA: Diagnosis present

## 2024-01-13 MED ORDER — ALTEPLASE 2 MG IJ SOLR
2.0000 mg | Freq: Once | INTRAMUSCULAR | Status: AC
  Administered 2024-01-13: 2 mg
  Filled 2024-01-13: qty 2

## 2024-01-13 MED ORDER — CIPROFLOXACIN HCL 500 MG PO TABS
500.0000 mg | ORAL_TABLET | Freq: Two times a day (BID) | ORAL | 0 refills | Status: DC
  Filled 2024-01-13: qty 60, 30d supply, fill #0

## 2024-01-13 MED ORDER — SODIUM CHLORIDE 0.9% FLUSH: 10.0000 mL | INTRAVENOUS | Status: DC | PRN

## 2024-01-13 MED ORDER — CIPROFLOXACIN HCL 500 MG PO TABS
500.0000 mg | ORAL_TABLET | Freq: Two times a day (BID) | ORAL | Status: DC
  Filled 2024-01-13: qty 1

## 2024-01-13 MED ORDER — CEFADROXIL 500 MG PO CAPS
1000.0000 mg | ORAL_CAPSULE | Freq: Two times a day (BID) | ORAL | 0 refills | Status: DC
  Filled 2024-01-13: qty 120, 30d supply, fill #0

## 2024-01-13 MED ORDER — SODIUM CHLORIDE 0.9% FLUSH
10.0000 mL | Freq: Two times a day (BID) | INTRAVENOUS | Status: DC
Start: 2024-01-13 — End: 2024-01-19
  Administered 2024-01-13 (×2): 10 mL
  Administered 2024-01-14: 20 mL
  Administered 2024-01-14 – 2024-01-19 (×8): 10 mL

## 2024-01-13 MED ORDER — CEFADROXIL 500 MG PO CAPS
1000.0000 mg | ORAL_CAPSULE | Freq: Two times a day (BID) | ORAL | Status: DC
  Filled 2024-01-13: qty 2

## 2024-01-13 NOTE — Progress Notes (Signed)
       Date: 01/13/2024  Patient name: [name]  Medical record number: [id number]  Date of birth: Aug 14, 1961    77 YOF with hx thoracic discitis/osteo with Enterobacter/MSSA isolated on culture on cefepime which was extended in part to get through Neurosurgery  There appears to be hardware involved.  I think this it is reasonable at this point to change to oral antibiotics in form of cefadroxil and ciprofloxacin.  I would recommend having her DC with 30 day supply and several refills.  I typically would prefer a year of postoperative antibiotics at minimum   [name] has an appointment on 01/20/2024 at 330 PM  [name] for Infectious Disease, which  is located in the [facility] at  [address] in [place].  [address], which is located to the left of the elevators.  Phone: [phone]  Fax: [phone]  [email or web address]  The patient should arrive 30 minutes prior to their appoitment.     Acey Lav 01/13/2024, 12:43 PM

## 2024-01-13 NOTE — Progress Notes (Signed)
 IV team spoke to patient about PICC line removal order placed.  Patient requested that "nothing be removed" until she has the opportunity to speak with Dr. Franky Macho.

## 2024-01-13 NOTE — [place] FL2 (Signed)
  MEDICAID FL2 LEVEL OF CARE FORM     IDENTIFICATION  Patient Name: [name] Birthdate: 12-24-1960 Sex: female Admission Date (Current Location): 01/08/2024  [facility] and [place] Number:  Producer, television/film/video and Address:  [facility], [address], [place], [name] [address]      Provider Number: [id number]  Attending Physician Name and Address:  Coletta Memos, MD  Relative Name and Phone Number:  Alivia, Cimino   [phone]    Current Level of Care: Hospital Recommended Level of Care: Skilled Nursing Facility Prior Approval Number:    Date Approved/Denied:   PASRR Number: [id number]  Discharge Plan: SNF    Current Diagnoses: Patient Active Problem List   Diagnosis Date Noted   Thoracic stenosis 01/08/2024   Thoracic spinal stenosis 01/08/2024   Obesity, Class III, BMI 40-49.9 (morbid obesity) (HCC) 12/30/2023   History of long term use of antibiotics due to prior spinal hardware infection MSSA and Enterobacter cloacae 12/30/2023   Osteomyelitis of thoracic region (HCC) 12/16/2023   Medication management 12/15/2023   Falls frequently 11/27/2023   Problem with vascular access--PICC line dislodgement 11/26/2023   T9 discitis/osteomyelitis and T9 epidural phlegmon/abscess s/p aspiration 10/29/2023 10/28/2023   Wound infection after surgery 05/14/2022   Delayed surgical wound healing 05/12/2022   Postoperative wound infection 04/08/2022   Scoliosis due to degenerative disease of spine in adult patient 03/30/2022   HNP (herniated nucleus pulposus), cervical 12/08/2020   Lumbago with sciatica, unspecified side 07/28/2020   PICC (peripherally inserted central catheter) in place 03/16/2020   Encounter for long-term (current) use of antibiotics 03/16/2020   Polypharmacy 03/09/2020   At risk for adverse drug event 03/09/2020   MSSA bacteremia 02/29/2020   Postoperative infection 02/28/2020   Abnormal gait due to muscle  weakness 02/26/2020   HNP (herniated nucleus pulposus), lumbar 02/16/2020   Muscle weakness of lower extremity 02/14/2020   Spondylolisthesis of lumbar region 10/30/2017   Painful orthopaedic hardware right ankle 05/27/2016   Closed fracture of distal end of right fibula and tibia    Ankle syndesmosis disruption    ASCUS favor benign 01/06/2014   Leg swelling 12/22/2013   Seasonal allergies    Anemia    Chronic back pain due to DJD with history surgery    Hyperthyroidism    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 03/24/2012   Status post left total prosthetic replacement of knee joint using cement 03/24/2012   Hypokalemia 01/28/2012   Fibromyalgia    GERD (gastroesophageal reflux disease)    Anxiety    Depression    Hypertension    Lumbar degenerative disc disease 09/25/2011    Orientation RESPIRATION BLADDER Height & Weight     Self, Time, Situation, Place  Normal Incontinent, External catheter Weight: 209 lb (94.8 kg) Height:  5\' 2"  (157.5 cm)  BEHAVIORAL SYMPTOMS/MOOD NEUROLOGICAL BOWEL NUTRITION STATUS      Continent Diet (see discharge summary)  AMBULATORY STATUS COMMUNICATION OF NEEDS Skin   Limited Assist Verbally Wound Vac, Surgical wounds                       Personal Care Assistance Level of Assistance  Bathing, Feeding, Dressing Bathing Assistance: Limited assistance Feeding assistance: Independent Dressing Assistance: Limited assistance     Functional Limitations Info  Sight, Hearing, Speech Sight Info: Adequate Hearing Info: Adequate Speech Info: Adequate    SPECIAL CARE FACTORS FREQUENCY  PT (By licensed PT), OT (  By licensed OT)     PT Frequency: 5x week OT Frequency: 5x week            Contractures Contractures Info: Not present    Additional Factors Info  Code Status, Allergies Code Status Info: full Allergies Info: Monosodium Glutamate, Shellfish Allergy, Baclofen, Celebrex (Celecoxib), Contrast Media (Iodinated  Contrast Media), Diclofenac, Gadolinium, Other, Latex           Current Medications (01/13/2024):  This is the current hospital active medication list Current Facility-Administered Medications  Medication Dose Route Frequency Provider Last Rate Last Admin   acetaminophen (TYLENOL) tablet 650 mg  650 mg Oral Q4H PRN Coletta Memos, MD   650 mg at 01/13/24 9604   Or   acetaminophen (TYLENOL) suppository 650 mg  650 mg Rectal Q4H PRN Coletta Memos, MD       albuterol (PROVENTIL) (2.5 MG/3ML) 0.083% nebulizer solution 2.5 mg  2.5 mg Nebulization Q6H PRN Pham, Minh Q, RPH-CPP       amLODipine (NORVASC) tablet 5 mg  5 mg Oral q AM Coletta Memos, MD   5 mg at 01/13/24 5409   ascorbic acid (VITAMIN C) tablet 500 mg  500 mg Oral q AM Coletta Memos, MD   500 mg at 01/13/24 8119   aspirin EC tablet 81 mg  81 mg Oral q AM Coletta Memos, MD   81 mg at 01/13/24 0624   atenolol (TENORMIN) tablet 50 mg  50 mg Oral Daily Coletta Memos, MD   50 mg at 01/13/24 1478   bisacodyl (DULCOLAX) EC tablet 5 mg  5 mg Oral Daily PRN Coletta Memos, MD       ceFEPIme (MAXIPIME) 2 g in sodium chloride 0.9 % 100 mL IVPB  2 g Intravenous Q8H Coletta Memos, MD 200 mL/hr at 01/13/24 0625 2 g at 01/13/24 2956   Chlorhexidine Gluconate Cloth 2 % PADS 6 each  6 each Topical Q0600 Coletta Memos, MD   6 each at 01/13/24 2130   chlorthalidone (HYGROTON) tablet 25 mg  25 mg Oral Daily Coletta Memos, MD   25 mg at 01/13/24 8657   chlorzoxazone (PARAFON) tablet 500 mg  500 mg Oral QID Leander Rams, RPH   500 mg at 01/13/24 8469   cholecalciferol (VITAMIN D3) 25 MCG (1000 UNIT) tablet 1,000 Units  1,000 Units Oral Daily Coletta Memos, MD   1,000 Units at 01/13/24 6295   cyanocobalamin (VITAMIN B12) tablet 1,000 mcg  1,000 mcg Oral Weekly Coletta Memos, MD   1,000 mcg at 01/09/24 1036   diazepam (VALIUM) tablet 5 mg  5 mg Oral QHS Coletta Memos, MD   5 mg at 01/12/24 2108   diphenhydrAMINE (BENADRYL) capsule 50 mg  50 mg Oral Q8H PRN  Coletta Memos, MD   50 mg at 01/13/24 2841   ferrous sulfate tablet 325 mg  325 mg Oral q AM Coletta Memos, MD   325 mg at 01/13/24 3244   gabapentin (NEURONTIN) capsule 1,200 mg  1,200 mg Oral TID Coletta Memos, MD   1,200 mg at 01/13/24 0102   heparin injection 5,000 Units  5,000 Units Subcutaneous Q8H Coletta Memos, MD   5,000 Units at 01/13/24 7253   lubiprostone (AMITIZA) capsule 48 mcg  48 mcg Oral QAC lunch Coletta Memos, MD   48 mcg at 01/12/24 1108   magnesium citrate solution 1 Bottle  1 Bottle Oral Once PRN Coletta Memos, MD       menthol-cetylpyridinium (CEPACOL) lozenge 3 mg  1  lozenge Oral PRN Coletta Memos, MD       Or   phenol (CHLORASEPTIC) mouth spray 1 spray  1 spray Mouth/Throat PRN Coletta Memos, MD       morphine (PF) 2 MG/ML injection 2 mg  2 mg Intravenous Q2H PRN Coletta Memos, MD       multivitamin with minerals tablet 1 tablet  1 tablet Oral Daily Coletta Memos, MD   1 tablet at 01/13/24 0928   ondansetron (ZOFRAN) injection 4 mg  4 mg Intravenous Q6H PRN Coletta Memos, MD       ondansetron (ZOFRAN-ODT) disintegrating tablet 4 mg  4 mg Oral Q6H PRN Coletta Memos, MD       oxyCODONE (Oxy IR/ROXICODONE) immediate release tablet 10 mg  10 mg Oral Q3H PRN Coletta Memos, MD   10 mg at 01/11/24 2030   oxyCODONE (Oxy IR/ROXICODONE) immediate release tablet 5 mg  5 mg Oral Q3H PRN Coletta Memos, MD   5 mg at 01/11/24 1626   oxyCODONE (OXYCONTIN) 12 hr tablet 10 mg  10 mg Oral Q12H Coletta Memos, MD   10 mg at 01/13/24 4098   pantoprazole (PROTONIX) EC tablet 40 mg  40 mg Oral Daily Coletta Memos, MD   40 mg at 01/13/24 1191   senna (SENOKOT) tablet 8.6 mg  1 tablet Oral BID Coletta Memos, MD   8.6 mg at 01/13/24 4782   senna-docusate (Senokot-S) tablet 1 tablet  1 tablet Oral QHS PRN Coletta Memos, MD       sodium chloride flush (NS) 0.9 % injection 3 mL  3 mL Intravenous Q12H Coletta Memos, MD   3 mL at 01/13/24 0929   sodium chloride flush (NS) 0.9 % injection 3 mL  3 mL  Intravenous PRN Coletta Memos, MD         Discharge Medications: Please see discharge summary for a list of discharge medications.  Relevant Imaging Results:  Relevant Lab Results:   Additional Information SSN: [id number]  Lorri Frederick, LCSW

## 2024-01-13 NOTE — Progress Notes (Signed)
 RUA DL PICC line assessed and dressing changed. Red port infusing well with IVF @ KVO rate, flushed with NBR. Purple port unable to flush, occluded with old dried blood in catheter. Per patient, during [facility] visit to change the PICC dressing that catheter came out a couple of weeks ago. RN aware and ordered cxr for tip placement. Will follow up.

## 2024-01-13 NOTE — TOC Progression Note (Addendum)
 Transition of Care [facility] - Progression Note    Patient Details  Name: [name] MRN: [id number] Date of Birth: [birth year]  [facility] CM/SW Contact  Lorri Frederick, LCSW Phone Number: 01/13/2024, 11:33 AM  Clinical Narrative:   CSW spoke with pt regarding DC plan.  Pt reports she did speak to her son and he did not agree with plan for her to DC straight home.  She is agreeable to SNF at this time.  Referral sent out in hub for SNF.   1400: TC with pt son Despina Hick.  He asked for placement update.  Discussed copay days as pt was at [facility] at the end of February.  He is in [place], asked for update tomorrow.     Expected Discharge Plan:  (TBD) Barriers to Discharge: Continued Medical Work up  Expected Discharge Plan and Services In-house Referral: Clinical Social Work     Living arrangements for the past 2 months: Single Family Home                                       Social Determinants of Health (SDOH) Interventions SDOH Screenings   Food Insecurity: No Food Insecurity (01/09/2024)  Housing: Low Risk  (01/09/2024)  Transportation Needs: No Transportation Needs (01/09/2024)  Utilities: Not At Risk (01/09/2024)  Depression (PHQ2-9): Low Risk  (01/12/2024)  Tobacco Use: Medium Risk (01/08/2024)    Readmission Risk Interventions    05/17/2022    3:02 PM  Readmission Risk Prevention Plan  Transportation Screening Complete  PCP or Specialist Appt within 5-7 Days Complete  Home Care Screening Complete  Medication Review (RN CM) Complete

## 2024-01-13 NOTE — Progress Notes (Addendum)
 Called [facility] reception as patient is very upset and wants clarification as to why her antibiotics were switched to oral antibiotics. Patient states that she had conversation with Dr. Franky Macho that she would need IV antibiotic still. Patient refuses to start any oral antibiotics for tonight without clarification. She also refuses PICC line removal for same reason that DR. Cabbell apparently has assured her that she still need IV antibiotic.   At 2000, Dr, Danielle Dess called and agreed for IV antibiotic to be restated only for tonight until discussion with Dr, Franky Macho will be made tomorrow with patient.

## 2024-01-13 NOTE — Progress Notes (Signed)
 Physical Therapy Treatment Patient Details Name: [name] MRN: [id number] DOB: 1961/06/30 Today's Date: 01/13/2024   History of Present Illness Pt admitted 4/3 for T9-10 laminectomy in setting of stenosis. Recent admission 2/19-2/27 due to possible discitis and underwent T9-T10 aspiration. Presented 3/25-2/26 for PICC line replacement after accidental dislodgement. PMH: anemia, anxiety, fibromyalgia, GERD, HTN, hyperthyroidism, IBS, ACDF 3/4, Posterior lateral arthrodesis 6/16, CSF leak 6/24.    PT Comments  Pt demonstrates some improvement in requiring less assist with bed mobility this session with head of bed elevated. Continues to require moderate assist for transitions to standing and limited ambulation distance using RW. R aircast donned for lateral ankle stability. Pt ambulating a few feet before needing to urinate, so BSC brought up behind patient. Following toileting, pt reporting increased fatigue and R knee buckle, requesting to sit down. Worked on isometrics in the chair. Recommend post acute rehab at d/c.   If plan is discharge home, recommend the following: A lot of help with walking and/or transfers;A lot of help with bathing/dressing/bathroom;Assistance with cooking/housework;Assist for transportation;Help with stairs or ramp for entrance   Can travel by private vehicle        Equipment Recommendations  Wheelchair (measurements PT)    Recommendations for Other Services       Precautions / Restrictions Precautions Precautions: Fall;Back Precaution Booklet Issued: Yes (comment) Recall of Precautions/Restrictions: Impaired Restrictions Weight Bearing Restrictions Per Provider Order: No     Mobility  Bed Mobility Overal bed mobility: Needs Assistance Bed Mobility: Rolling, Sidelying to Sit Rolling: Supervision Sidelying to sit: Min assist       General bed mobility comments: HOB elevated, use of rail, PT utilized bed pad to scoot left hip forward to edge of  bed    Transfers Overall transfer level: Needs assistance Equipment used: Rolling walker (2 wheels) Transfers: Sit to/from Stand Sit to Stand: Mod assist, From elevated surface           General transfer comment: ModA to boost up to standing position from edge of bed and [facility]    Ambulation/Gait Ambulation/Gait assistance: Mod assist, +2 safety/equipment Gait Distance (Feet): 5 Feet Assistive device: Rolling walker (2 wheels) Gait Pattern/deviations: Step-to pattern, Decreased stride length, Decreased dorsiflexion - right, Trunk flexed Gait velocity: decreased Gait velocity interpretation: <1.31 ft/sec, indicative of household ambulator   General Gait Details: Verbal cues for stepping initiation, sequencing/technique, manual assist for progression of RW. Pt heavily dependent on RW through arm. Decreased R foot clearance   Stairs             Wheelchair Mobility     Tilt Bed    Modified Rankin (Stroke Patients Only)       Balance Overall balance assessment: Needs assistance, History of Falls Sitting-balance support: Feet supported, No upper extremity supported Sitting balance-[name]: Fair     Standing balance support: Bilateral upper extremity supported, During functional activity Standing balance-[name]: Poor Standing balance comment: dependent on BUE support                            Communication Communication Communication: No apparent difficulties  Cognition Arousal: Alert Behavior During Therapy: WFL for tasks assessed/performed   PT - Cognitive impairments: No family/caregiver present to determine baseline                       PT - Cognition Comments: Tangential Following commands: Intact  Cueing Cueing Techniques: Verbal cues, Gestural cues  Exercises General Exercises - Lower Extremity Quad Sets: Both, 10 reps, Supine Gluteal Sets: 10 reps, Supine    General Comments        Pertinent Vitals/Pain  Pain Assessment Pain Assessment: Faces Faces Pain Scale: Hurts a little bit Pain Location: headache Pain Descriptors / Indicators: Headache Pain Intervention(s): Monitored during session, Patient requesting pain meds-RN notified    Home Living                          Prior Function            PT Goals (current goals can now be found in the care plan section) Acute Rehab PT Goals Potential to Achieve Goals: Good Progress towards PT goals: Not progressing toward goals - comment    Frequency    Min 5X/week      PT Plan      Co-evaluation              AM-PAC PT "6 Clicks" Mobility   Outcome Measure  Help needed turning from your back to your side while in a flat bed without using bedrails?: A Little Help needed moving from lying on your back to sitting on the side of a flat bed without using bedrails?: A Little Help needed moving to and from a bed to a chair (including a wheelchair)?: A Lot Help needed standing up from a chair using your arms (e.g., wheelchair or bedside chair)?: A Lot Help needed to walk in hospital room?: Total Help needed climbing 3-5 steps with a railing? : Total 6 Click Score: 12    End of Session Equipment Utilized During Treatment: Gait belt Activity Tolerance: Patient tolerated treatment well Patient left: in chair;with call bell/phone within reach;with chair alarm set Nurse Communication: Mobility status PT Visit Diagnosis: Unsteadiness on feet (R26.81);Other abnormalities of gait and mobility (R26.89);Repeated falls (R29.6);Muscle weakness (generalized) (M62.81);Pain Pain - part of body:  (back)     Time: 8295-6213 PT Time Calculation (min) (ACUTE ONLY): 39 min  Charges:    $Therapeutic Activity: 38-52 mins PT General Charges $$ ACUTE PT VISIT: 1 Visit                     Lillia Pauls, PT, DPT Acute Rehabilitation Services Office [phone]    Norval Morton 01/13/2024, 1:04 PM

## 2024-01-13 NOTE — Progress Notes (Signed)
 Occupational Therapy Treatment Patient Details Name: [name] MRN: [id number] DOB: 10-10-1960 Today's Date: 01/13/2024   History of present illness Pt admitted 4/3 for T9-10 laminectomy in setting of stenosis. Recent admission 2/19-2/27 due to possible discitis and underwent T9-T10 aspiration. Presented 3/25-2/26 for PICC line replacement after accidental dislodgement. PMH: anemia, anxiety, fibromyalgia, GERD, HTN, hyperthyroidism, IBS, ACDF 3/4, Posterior lateral arthrodesis 6/16, CSF leak 6/24.   OT comments  Pt progressing well towards goals.  OT reviewed back precautions hand out with pt. Pt verbalized understanding, but had difficulty maintaining them during functional tasks. Simulated LB dressing and toilet hygiene in room. Heavy cues required to use compensatory strategy previously discussed in session. Per pt she will discuss d/c options once she has spoken to her son and case Production designer, theatre/television/film. Continue to recommend post-acute rehab to optimize independence levels. Will continue to follow acutely.      If plan is discharge home, recommend the following:  A lot of help with walking and/or transfers;Two people to help with walking and/or transfers;A lot of help with bathing/dressing/bathroom;Two people to help with bathing/dressing/bathroom   Equipment Recommendations  Other (comment) (Defer to next venue)    Recommendations for Other Services      Precautions / Restrictions Precautions Precautions: Fall;Back Precaution Booklet Issued: Yes (comment) Recall of Precautions/Restrictions: Impaired Precaution/Restrictions Comments: pt able to verbalize precautions, but not adhere during movement despite cues Restrictions Weight Bearing Restrictions Per Provider Order: No       Mobility Bed Mobility Overal bed mobility: Needs Assistance             General bed mobility comments: Received in recliner    Transfers Overall transfer level: Needs assistance Equipment used:  Rolling walker (2 wheels) Transfers: Sit to/from Stand Sit to Stand: Contact guard assist           General transfer comment: CGA cues to use RW     Balance Overall balance assessment: Needs assistance, History of Falls Sitting-balance support: Feet supported, No upper extremity supported Sitting balance-[name]: Fair     Standing balance support: Bilateral upper extremity supported, During functional activity Standing balance-[name]: Poor Standing balance comment: Reliant on RW           ADL either performed or assessed with clinical judgement   ADL Overall ADL's : Needs assistance/impaired                     Lower Body Dressing: Minimal assistance Lower Body Dressing Details (indicate cue type and reason): Pt verbalized using a cane at home to don pants, educated on use of reacher and first in last out     Toileting- Architect and Hygiene: Contact guard assist;Sit to/from stand;Cueing for compensatory techniques;Cueing for back precautions Toileting - Clothing Manipulation Details (indicate cue type and reason): Verbal and tacile cues used to maintainin back precautions       General ADL Comments: Difficulty maintaing back precautions during ADLs    Extremity/Trunk Assessment Upper Extremity Assessment Upper Extremity Assessment: Generalized weakness;Right hand dominant   Lower Extremity Assessment Lower Extremity Assessment: Defer to PT evaluation        Vision   Vision Assessment?: No apparent visual deficits         Communication Communication Communication: No apparent difficulties   Cognition Arousal: Alert Behavior During Therapy: WFL for tasks assessed/performed Cognition: No family/caregiver present to determine baseline             OT - Cognition Comments:  pleasant, some decreased insight into safety, deficits and safe mgmt at home.                 Following commands: Intact        Cueing    Cueing Techniques: Verbal cues, Gestural cues, Tactile cues, Visual cues        General Comments Family member present in room    Pertinent Vitals/ Pain       Pain Assessment Pain Assessment: Faces Faces Pain Scale: Hurts a little bit Pain Location: headache Pain Descriptors / Indicators: Headache Pain Intervention(s): Monitored during session, Repositioned, Premedicated before session   Frequency  Min 2X/week        Progress Toward Goals  OT Goals(current goals can now be found in the care plan section)  Progress towards OT goals: Progressing toward goals  Acute Rehab OT Goals Patient Stated Goal: None stated OT Goal Formulation: With patient Time For Goal Achievement: 01/23/24 Potential to Achieve Goals: Good ADL Goals Pt Will Perform Lower Body Bathing: with min assist;sit to/from stand;sitting/lateral leans Pt Will Perform Lower Body Dressing: with min assist;sit to/from stand;sitting/lateral leans Pt Will Transfer to Toilet: with supervision;stand pivot transfer;bedside commode Pt Will Perform Toileting - Clothing Manipulation and hygiene: with mod assist;sitting/lateral leans;sit to/from stand Additional ADL Goal #1: Pt to complete bed mobility with Min A in prep for EOB/OOB ADLs  Plan         AM-PAC OT "6 Clicks" Daily Activity     Outcome Measure   Help from another person eating meals?: None Help from another person taking care of personal grooming?: A Little Help from another person toileting, which includes using toliet, bedpan, or urinal?: A Lot Help from another person bathing (including washing, rinsing, drying)?: A Lot Help from another person to put on and taking off regular upper body clothing?: A Little Help from another person to put on and taking off regular lower body clothing?: A Lot 6 Click Score: 16    End of Session Equipment Utilized During Treatment: Gait belt;Rolling walker (2 wheels)  OT Visit Diagnosis: Other abnormalities of gait  and mobility (R26.89);Unsteadiness on feet (R26.81);History of falling (Z91.81)   Activity Tolerance Patient tolerated treatment well   Patient Left in chair;with call bell/phone within reach;with chair alarm set;with family/visitor present   Nurse Communication Mobility status        Time: 7829-5621 OT Time Calculation (min): 30 min  Charges: OT General Charges $OT Visit: 1 Visit OT Treatments $Self Care/Home Management : 23-37 mins  Ivor Messier, OT  Acute Rehabilitation Services Office [phone] Secure chat preferred   Marilynne Drivers 01/13/2024, 2:20 PM

## 2024-01-13 NOTE — Progress Notes (Signed)
 Per patient request, would like to speak with Dr. Franky Macho before PICC line removal.

## 2024-01-13 NOTE — Plan of Care (Signed)
  Problem: Education: Goal: Ability to verbalize activity precautions or restrictions will improve Outcome: Progressing Goal: Knowledge of the prescribed therapeutic regimen will improve Outcome: Progressing Goal: Understanding of discharge needs will improve Outcome: Progressing   Problem: Activity: Goal: Ability to avoid complications of mobility impairment will improve Outcome: Progressing   Problem: Clinical Measurements: Goal: Ability to maintain clinical measurements within normal limits will improve Outcome: Progressing   Problem: Pain Management: Goal: Pain level will decrease Outcome: Progressing   Problem: Skin Integrity: Goal: Will show signs of wound healing Outcome: Progressing

## 2024-01-13 NOTE — Progress Notes (Signed)
 Patient ID: [name], female   DOB: May 26, 1961, 63 y.o.   MRN: [id number] BP (!) 155/75 (BP Location: Left Arm)   Pulse 62   Temp 98.6 F (37 C)   Resp 18   Ht 5\' 2"  (1.575 m)   Wt 94.8 kg   LMP 11/20/2012   SpO2 94%   BMI 38.23 kg/m  [name] had desired to speak with the ID physician about her course of treatment. I am not sure if this occurred but Dr. Daiva Eves did leave a note.  Alert and oriented x 4, speech is clear and fluent Moving all extremities Wound vac in place. Currently afebrile, no evidence of ongoing infection.

## 2024-01-14 DIAGNOSIS — M797 Fibromyalgia: Secondary | ICD-10-CM

## 2024-01-14 DIAGNOSIS — R32 Unspecified urinary incontinence: Secondary | ICD-10-CM

## 2024-01-14 DIAGNOSIS — M4804 Spinal stenosis, thoracic region: Secondary | ICD-10-CM

## 2024-01-14 DIAGNOSIS — K219 Gastro-esophageal reflux disease without esophagitis: Secondary | ICD-10-CM

## 2024-01-14 DIAGNOSIS — I10 Essential (primary) hypertension: Secondary | ICD-10-CM

## 2024-01-14 DIAGNOSIS — E079 Disorder of thyroid, unspecified: Secondary | ICD-10-CM

## 2024-01-14 DIAGNOSIS — M4644 Discitis, unspecified, thoracic region: Secondary | ICD-10-CM | POA: Diagnosis not present

## 2024-01-14 DIAGNOSIS — K589 Irritable bowel syndrome without diarrhea: Secondary | ICD-10-CM

## 2024-01-14 MED ORDER — SODIUM CHLORIDE 0.9 % IV SOLN
2.0000 g | Freq: Three times a day (TID) | INTRAVENOUS | Status: DC
  Administered 2024-01-14 – 2024-01-19 (×16): 2 g via INTRAVENOUS
  Filled 2024-01-14 (×16): qty 12.5

## 2024-01-14 MED ORDER — ALTEPLASE 2 MG IJ SOLR
2.0000 mg | Freq: Once | INTRAMUSCULAR | Status: AC
  Administered 2024-01-14: 2 mg
  Filled 2024-01-14: qty 2

## 2024-01-14 MED ORDER — ENSURE ENLIVE PO LIQD
237.0000 mL | Freq: Two times a day (BID) | ORAL | Status: DC
  Administered 2024-01-14 – 2024-01-19 (×10): 237 mL via ORAL

## 2024-01-14 NOTE — TOC Progression Note (Signed)
 Transition of Care [facility]) - Progression Note    Patient Details  Name: [name] MRN: [id number] Date of Birth: August 31, 1961  [facility] CM/SW Contact  Lorri Frederick, LCSW Phone Number: 01/14/2024, 12:18 PM  Clinical Narrative:   CSW confirmed with Nikki/[facility]: pt on preveena, she can offer bed.  Bed offers provided to pt on medicare choice document.  Pt will review.  Pt points out to CSW that her status has changed from outpt in bed to inpt--she would like CSW to again ask CIR if she is now a candidate, as they previously cited her outpt in bed status as exclusionary.  Message sent to CIR.    Expected Discharge Plan:  (TBD) Barriers to Discharge: Continued Medical Work up  Expected Discharge Plan and Services In-house Referral: Clinical Social Work     Living arrangements for the past 2 months: Single Family Home                                       Social Determinants of Health (SDOH) Interventions SDOH Screenings   Food Insecurity: No Food Insecurity (01/09/2024)  Housing: Low Risk  (01/09/2024)  Transportation Needs: No Transportation Needs (01/09/2024)  Utilities: Not At Risk (01/09/2024)  Depression (PHQ2-9): Low Risk  (01/12/2024)  Tobacco Use: Medium Risk (01/08/2024)    Readmission Risk Interventions    05/17/2022    3:02 PM  Readmission Risk Prevention Plan  Transportation Screening Complete  PCP or Specialist Appt within 5-7 Days Complete  Home Care Screening Complete  Medication Review (RN CM) Complete

## 2024-01-14 NOTE — Progress Notes (Signed)
  Inpatient Rehab Admissions Coordinator :  Per therapy recommendations and patient request, patient was screened for CIR candidacy by Ottie Glazier RN MSN.  At this time patient appears to be a potential candidate for CIR. I will place a rehab consult per protocol for full assessment. Please call me with any questions.  Ottie Glazier RN MSN Admissions Coordinator [phone]

## 2024-01-14 NOTE — Plan of Care (Signed)
  Problem: Clinical Measurements: Goal: Will remain free from infection Outcome: Not Progressing   Problem: Coping: Goal: Level of anxiety will decrease Outcome: Not Progressing   Problem: Elimination: Goal: Will not experience complications related to bowel motility Outcome: Not Progressing   Problem: Pain Managment: Goal: General experience of comfort will improve and/or be controlled Outcome: Not Progressing   Problem: Safety: Goal: Ability to remain free from injury will improve Outcome: Not Progressing

## 2024-01-14 NOTE — Progress Notes (Signed)
 Patient ID: [name], female   DOB: June 27, 1961, 63 y.o.   MRN: [id number] BP (!) 145/76   Pulse 73   Temp 98.5 F (36.9 C) (Oral)   Resp 16   Ht 5\' 2"  (1.575 m)   Wt 94.8 kg   LMP 11/20/2012   SpO2 98%   BMI 38.23 kg/m  Alert and oriented x 4. Speech is clear and fluent Will stay until wound vac off and wound can be assessed. Have spoken with ID, switch to oral abx will occur at discharge

## 2024-01-14 NOTE — Progress Notes (Signed)
    [facility]  Date of Admission:  01/08/2024     Reason for Follow Up: Osteomyelitis/discitis         BRIEF PROGRESS NOTE:  [name] is POD #6 from T9-T10 laminectomy and decompression in the setting of previous Enterobacter and MSSA infection and completion of 6 weeks of cefepime through surgery on 01/08/2024.  Discussed recommended plan of care to change cefepime to oral cefadroxil and ciprofloxacin as she has completed treatment for osteomyelitis/discitis.  Plan of care discussed with neurosurgery and will continue cefepime during hospitalization and transition to oral suppression at discharge with 500 mg ciprofloxacin p.o. twice daily and cefadroxil 1 g p.o. twice daily.  Continue postoperative wound care per neurosurgery.  Follow-up with Dr. Thedore Mins as scheduled.  Marcos Eke, NP [facility]  01/14/2024  3:12 PM

## 2024-01-14 NOTE — Plan of Care (Signed)
  Problem: Education: Goal: Knowledge of General Education information will improve Description: Including pain rating scale, medication(s)/side effects and non-pharmacologic comfort measures Outcome: Progressing   Problem: Clinical Measurements: Goal: Ability to maintain clinical measurements within normal limits will improve Outcome: Progressing   Problem: Nutrition: Goal: Adequate nutrition will be maintained Outcome: Progressing   Problem: Elimination: Goal: Will not experience complications related to urinary retention Outcome: Progressing

## 2024-01-14 NOTE — Progress Notes (Signed)
 Secure chat sent to RN requesting IVT consult be placed when patient is agreeable for PICC removal. IVT attempted x2 4/8 but wanted clarification on her antibiotic regimen before removal. [place] sent this morning to ID team requesting follow up to re-address.   RN agreeable to placing consult when patient is ready.   Neddie Steedman Loyola Mast, RN

## 2024-01-14 NOTE — Progress Notes (Signed)
 Pharmacy Antibiotic Note  [name] is a 63 y.o. female to resume Cefepime for Enterobacter/MSSA thoracic discitis/ osteomyelitis .  Pharmacy has been consulted for dosing.  Cefepime was changed to Cipro and Cefadroxil on 4/8, but doses refused.  Now changing back to Cefepime while inpatient.  Last Cefepime dose ~4pm on 4/8.   Last bmet 3/25 prior admit, creatinine alone on 4/3 = 0.71  Plan: Resume Cefepime 2gm IV q8h. Bmet in am to be sure stable.  Height: 5\' 2"  (157.5 cm) Weight: 94.8 kg (209 lb) IBW/kg (Calculated) : 50.1  Temp (24hrs), Avg:98.3 F (36.8 C), Min:97.7 F (36.5 C), Max:98.5 F (36.9 C)  Recent Labs  Lab 01/08/24 2226  WBC 9.8  CREATININE 0.71    Estimated Creatinine Clearance: 79.3 mL/min (by C-G formula based on SCr of 0.71 mg/dL).    Allergies  Allergen Reactions   Monosodium Glutamate Anaphylaxis and Swelling    Eyes swollen shut, facial swelling, tongue swelling.   Shellfish Allergy Anaphylaxis   Baclofen Nausea Only    Dizziness and increase muscle spasm   Celebrex [Celecoxib] Itching    Only allergic to generic brand   Contrast Media [Iodinated Contrast Media] Itching and Nausea Only    "could not walk"   Diclofenac Itching    Generic Diclefenac gel causes itching. Can take the name brand Voltaren gel   Gadolinium     Allergic to MRI contrast dye per patient.   Other Other (See Comments)    Pet dander,        Latex Itching and Rash    Latex glove with powder; bite blocks used for EGD studies    Antimicrobials this admission: Cefepime 3/25 prior admit >OPAT thru 3/31; resumed on admit 4/3 >> 4/8; 4/9>> Cefadroxil + Cipro PO ordered 4/8 > refused 4/8 and 4/9 > discontinued  Dose adjustments this admission: n/a  Microbiology results: 4/3 MRSA PCR: negative  Thank you for allowing pharmacy to be a part of this patient's care.  Dennie Fetters, [place] 01/14/2024 3:34 PM

## 2024-01-14 NOTE — H&P (Signed)
 Physical Medicine and Rehabilitation Consult Reason for Consult:Rehab Referring Physician: Franky Macho   HPI: [name] is a 63 y.o. female with past medical history of chronic back pain, depression, scoliosis, IBS, fibromyalgia, multiple spinal surgeries, GERD, hypertension,  obesity, T9 discitis/osteomyelitis and T9 epidural phlegmon/abscess. Patient had a prior spinal infection of spinal hardware in 2021 with MSSA.  Patient had surgery in 2023 again for surgical screw loosening and had postoperative wound infection and sepsis.  In January 2025 she was developing numbness in her upper extremities and numbness and weakness in her lower extremities.  She had imaging that indicated T9 discitis/osteomyelitis and T9 epidural phlegmon/abscess with moderate thecal compression.  MRI 11/26/2023 indicated severe spinal canal stenosis with spinal cord compression and severe bilateral neuroforaminal narrowing at T9-10, decreased fluid signal at the T9-10 disc space and slightly increased edema and surrounding endplates suggesting possible improvement in discitis with residual osteomyelitis.  She has been on home IV antibiotics.  She had aspiration/biopsy and was treated with cefepime.  She missed several doses of her cefepime due to PICC line dislodgment.  Patient was admitted for thoracic laminectomy and decompression T9/10 by Dr. Franky Macho on 01/08/2024. She had thoracic T9/10 laminectomy and decompression completed on on the same day.  She has a history of multiple joint surgeries.  Patient reports occasional bladder incontinence, denies bowel incontinence.  Patient indicates verylimited activity for the past 2 to 3 months.  She reports she is walking mostly with a cane prior to this time.  Reports pain throughout her entire body due to fibromyalgia.   Patient was seen by physical therapy and Occupational Therapy and found to have functional deficits that could benefit from CIR.  Patient lives in a one-story  home with 4 steps to enter with her dog.  She reports she may be able to stay with a friend or sister after discharge.  Review of Systems  Constitutional:  Positive for malaise/fatigue. Negative for chills and fever.  HENT:  Negative for congestion.   Eyes:  Negative for blurred vision and double vision.  Respiratory:  Negative for shortness of breath.   Cardiovascular:  Positive for leg swelling. Negative for chest pain.  Gastrointestinal:  Positive for constipation and nausea. Negative for abdominal pain and diarrhea.  Genitourinary:  Positive for frequency.       Incontinence  Musculoskeletal:  Positive for back pain, joint pain and neck pain.  Skin:  Positive for itching. Negative for rash.  Neurological:  Positive for tingling, sensory change and weakness. Negative for speech change.  Psychiatric/Behavioral:  Positive for depression.    Past Medical History:  Diagnosis Date   Anemia    Ankle syndesmosis disruption    Anxiety    takes Ativan and Valium, after mother passed   Arthritis    bilateral knees s/p knee replacement bilaterally   Asthma    Bronchitis    Bruising    pt states unexplained d/t fibromyalgia   Chronic back pain    2012 tailbone surgery and 3 lower discs.    Closed fracture of distal end of right fibula and tibia    Depression    from Fibromyalgia diagnosis; not taking medicine. since 2001   Dizziness    rarely   Fibromyalgia    diagnosed 2001   GERD (gastroesophageal reflux disease)    Prilosec occasionally   Headache(784.0)    "sinus headaches"   History of hiatal hernia    Hypertension    since  2013   Hyperthyroidism    subclinical, no treatment; thyroid nodules   IBS (irritable bowel syndrome)    Impaired memory    states from fibromyalgia   Insomnia    takes Ambien   Jones fracture    left foot fifth metatarsal   Multiple allergies    including latex, pet dander, shellfish, pet dander   Painful orthopaedic hardware right ankle  05/27/2016   Seasonal allergies    Shortness of breath    Occasional with exertion;    Sore gums    this is why pt is on Amoxil-only takes for dental work   Tachycardia    Thyroid goiter    Varicose vein    protrudes above skin-per pt;vein popped and bruised;ultrasound done to make sure that there were no clots;noclots were found   Past Surgical History:  Procedure Laterality Date   ANTERIOR CERVICAL DECOMP/DISCECTOMY FUSION N/A 12/08/2020   Procedure: Cervical Four-Five Anterior cervical decompression/discectomy/fusion;  Surgeon: Coletta Memos, MD;  Location: [facility] OR;  Service: Neurosurgery;  Laterality: N/A;  anterior   ANTERIOR LUMBAR FUSION  09/20/2011   Procedure: ANTERIOR LUMBAR FUSION 1 LEVEL;  Surgeon: Carmela Hurt;  Location: [facility];  Service: Neurosurgery;  Laterality: N/A;  Lumbar five-Sacral One Anterior Lumbar Interbody Fusion /Dr. Early to Approach    APPLICATION OF WOUND VAC N/A 05/14/2022   Procedure: Removal of wound vac with closure of wound;  Surgeon: Coletta Memos, MD;  Location: [facility] OR;  Service: Neurosurgery;  Laterality: N/A;  Pt to be admitted on 05-12-2022   CERVICAL DISC SURGERY     WITH TITANIUM PLATE IN NECK---LEFT SIDE   CERVICAL SPINE SURGERY  03/24/2019   CESAREAN SECTION     EYE SURGERY Bilateral 2023   cataract   fibroidectomy     FRACTURE SURGERY  05/08/2010   Jones fracture left foot fifth metatarsal   HARDWARE REMOVAL Right 05/28/2016   Procedure: RIGHT ANKLE HARDWARE REMOVAL;  Surgeon: Salvatore Marvel, MD;  Location: [facility];  Service: Orthopedics;  Laterality: Right;   HARDWARE REMOVAL N/A 07/28/2020   Procedure: Removal of Lumbar Hardware;  Surgeon: Coletta Memos, MD;  Location: [facility] OR;  Service: Neurosurgery;  Laterality: N/A;   HERNIA REPAIR     hiatal hernia   IR THORACIC DISC ASPIRATION W/IMG GUIDE  10/29/2023   IRRIGATION AND DEBRIDEMENT KNEE  04/09/2012   Procedure: IRRIGATION AND DEBRIDEMENT KNEE;  Surgeon: Thera Flake., MD;  Location: [facility] OR;  Service: Orthopedics;  Laterality: Right;   JOINT REPLACEMENT  01/27/2012   left total knee and Right total knee   KNEE SURGERY  2005   Left knee arthroscopy   LAMINECTOMY WITH POSTERIOR LATERAL ARTHRODESIS LEVEL 4 Bilateral 03/22/2022   Procedure: Thoracic ten to Lumbar three Posterior lateral arthrodesis with screws;  Surgeon: Coletta Memos, MD;  Location: [facility] OR;  Service: Neurosurgery;  Laterality: Bilateral;   LUMBAR WOUND DEBRIDEMENT N/A 04/08/2022   Procedure: LUMBAR WOUND DEBRIDEMENT;  Surgeon: Coletta Memos, MD;  Location: [facility] OR;  Service: Neurosurgery;  Laterality: N/A;   NASAL SEPTOPLASTY W/ TURBINOPLASTY  2007   due to recurrent sinusitis   ORIF ANKLE FRACTURE Right 06/21/2015   Procedure: OPEN REDUCTION INTERNAL FIXATION RIGHT DISTAL FIBULA  FRACTURE AND OPEN REDUCTION INTERNAL FIXATION SYNDESMOSIS ;  Surgeon: Salvatore Marvel, MD;  Location: [facility];  Service: Orthopedics;  Laterality: Right;   PARTIAL HYSTERECTOMY     SHOULDER ARTHROSCOPY W/ ROTATOR CUFF REPAIR Right  SHOULDER SURGERY Left    SPINE SURGERY  2004   Cervical plate, ACDF   STERIOD INJECTION  01/27/2012   Procedure: STEROID INJECTION;  Surgeon: Nilda Simmer, MD;  Location: [facility] OR;  Service: Orthopedics;  Laterality: Right;   TEE WITHOUT CARDIOVERSION N/A 03/01/2020   Procedure: TRANSESOPHAGEAL ECHOCARDIOGRAM (TEE);  Surgeon: Jake Bathe, MD;  Location: [facility] ENDOSCOPY;  Service: Cardiovascular;  Laterality: N/A;   THORACIC DISCECTOMY  02/16/2020   Procedure: Thoracic ten-eleven Discectomy;  Surgeon: Coletta Memos, MD;  Location: [facility] OR;  Service: Neurosurgery;;   THORACIC DISCECTOMY N/A 01/08/2024   Procedure: THORACIC NINE-TEN LAMINECTOMY AND DECOMPRESSION;  Surgeon: Coletta Memos, MD;  Location: [facility] OR;  Service: Neurosurgery;  Laterality: N/A;   TONSILLECTOMY  2007   TOTAL KNEE ARTHROPLASTY  01/27/2012   Procedure: TOTAL KNEE ARTHROPLASTY;  Surgeon: Nilda Simmer, MD;   Bilateral   TOTAL KNEE ARTHROPLASTY  04/06/2012   Procedure: TOTAL KNEE ARTHROPLASTY;  Surgeon: Nilda Simmer, MD;  Location: [facility] OR;  Service: Orthopedics;  Laterality: Right;   TUBAL LIGATION     UTERINE FIBROID SURGERY     mid 200s   WOUND EXPLORATION N/A 02/27/2020   Procedure: WOUND EXPLORATION;  Surgeon: Coletta Memos, MD;  Location: [facility] OR;  Service: Neurosurgery;  Laterality: N/A;,  (wound vac upper back)   WOUND EXPLORATION N/A 03/30/2022   Procedure: THORACIC WOUND EXPLORATION WITH REPAIR OF CSF LEAK;  Surgeon: Coletta Memos, MD;  Location: [facility] OR;  Service: Neurosurgery;  Laterality: N/A;   Family History  Problem Relation Age of Onset   Stroke Father        passed 2004 from pneumonia   Alcohol abuse Father    Pulmonary embolism Mother        after minor knee surgery leading to DVT   Arthritis Sister    Depression Sister    Hypertension Sister    Diabetes Other        grandmother   Anesthesia problems Neg Hx    Hypotension Neg Hx    Malignant hyperthermia Neg Hx    Pseudochol deficiency Neg Hx    Social History:  reports that she quit smoking about 25 years ago. Her smoking use included cigarettes. She started smoking about 35 years ago. She has a 5 pack-year smoking history. She has never used smokeless tobacco. She reports that she does not drink alcohol and does not use drugs. Allergies:  Allergies  Allergen Reactions   Monosodium Glutamate Anaphylaxis and Swelling    Eyes swollen shut, facial swelling, tongue swelling.   Shellfish Allergy Anaphylaxis   Baclofen Nausea Only    Dizziness and increase muscle spasm   Celebrex [Celecoxib] Itching    Only allergic to generic brand   Contrast Media [Iodinated Contrast Media] Itching and Nausea Only    "could not walk"   Diclofenac Itching    Generic Diclefenac gel causes itching. Can take the name brand Voltaren gel   Gadolinium     Allergic to MRI contrast dye per patient.   Other Other (See Comments)    Pet  dander,        Latex Itching and Rash    Latex glove with powder; bite blocks used for EGD studies   Medications Prior to Admission  Medication Sig Dispense Refill   Acetaminophen (TYLENOL PO) Take 1 tablet by mouth as needed.     amLODipine (NORVASC) 5 MG tablet Take 1 tablet (5 mg total) by mouth daily. (Patient taking differently:  Take 5 mg by mouth in the morning.)     ascorbic acid (VITAMIN C) 500 MG tablet Take 500 mg by mouth in the morning.     aspirin EC 81 MG tablet Take 81 mg by mouth in the morning.     atenolol (TENORMIN) 50 MG tablet Take 1 tablet (50 mg total) by mouth daily. (Patient taking differently: Take 50 mg by mouth in the morning.) 30 tablet 0   chlorthalidone (HYGROTON) 25 MG tablet Take 1 tablet (25 mg total) by mouth daily. (Patient taking differently: Take 25 mg by mouth in the morning.)     chlorzoxazone (PARAFON) 500 MG tablet Take 1 tablet (500 mg total) by mouth 3 (three) times daily. (Patient taking differently: Take 500 mg by mouth 4 (four) times daily.) 90 tablet 0   Cholecalciferol (VITAMIN D-3 PO) Take 1 tablet by mouth in the morning.     cyanocobalamin (VITAMIN B12) 1000 MCG tablet Take 1,000 mcg by mouth once a week.     diazepam (VALIUM) 5 MG tablet Take 1 tablet (5 mg total) by mouth 2 (two) times daily. (Patient taking differently: Take 5 mg by mouth See admin instructions. Take 5 mg at bedtime and additional 5 mg if needed for anxiety) 30 tablet 0   diclofenac Sodium (VOLTAREN) 1 % GEL Apply 2 g topically 4 (four) times daily as needed (pain).     diphenhydrAMINE (BENADRYL) 25 MG tablet Take 50 mg by mouth in the morning, at noon, in the evening, and at bedtime.     ferrous sulfate 325 (65 FE) MG EC tablet Take 325 mg by mouth in the morning.     gabapentin (NEURONTIN) 600 MG tablet Take 2 tablets (1,200 mg total) by mouth 3 (three) times daily. 180 tablet 0   lubiprostone (AMITIZA) 24 MCG capsule Take 1 capsule (24 mcg total) by mouth 2 (two) times  daily with a meal. (Patient taking differently: Take 48 mcg by mouth daily before lunch.) 60 capsule 0   methocarbamol (ROBAXIN) 500 MG tablet Take 500 mg by mouth 4 (four) times daily.     Multiple Minerals-Vitamins (CALCIUM-MAGNESIUM-ZINC-D3 PO) Take 1 tablet by mouth in the morning, at noon, and at bedtime.     omeprazole (PRILOSEC) 20 MG capsule Take 20 mg by mouth in the morning and at bedtime.      ondansetron (ZOFRAN-ODT) 4 MG disintegrating tablet Take 4 mg by mouth every 6 (six) hours as needed.     oxyCODONE-acetaminophen (PERCOCET) 10-325 MG tablet Take 1 tablet by mouth every 6 (six) hours as needed for pain. 20 tablet 0   albuterol (VENTOLIN HFA) 108 (90 Base) MCG/ACT inhaler Inhale 2 puffs into the lungs every 6 (six) hours as needed for wheezing or shortness of breath. 18 g 0   ceFEPIme 2 g in sodium chloride 0.9 % 100 mL Inject 2 g into the vein every 8 (eight) hours.     celecoxib (CELEBREX) 200 MG capsule Take 200 mg by mouth 2 (two) times daily.     NARCAN 4 MG/0.1ML LIQD nasal spray kit Place 1 spray into the nose once. (Patient taking differently: Place 1 spray into the nose once as needed (opioid overdose).)      Home: Home Living Family/patient expects to be discharged to:: Private residence Living Arrangements: Alone Available Help at Discharge: Family, Friend(s), Available PRN/intermittently Type of Home: House Home Access: Stairs to enter [employer] of Steps: 5 Entrance Stairs-Rails: Right, Left, Can reach both Home  Layout: One level Bathroom Shower/Tub: Engineer, manufacturing systems: Standard Home Equipment: Information systems manager, Higher education careers adviser held shower head, Agricultural consultant (2 wheels), Rollator (4 wheels), Grab bars - tub/shower, Cane - single point, Adaptive equipment, BSC/3in1, Crutches, Tub bench Adaptive Equipment: Reacher, Long-handled sponge  Functional History: Prior Function Prior Level of Function : History of Falls (last six months), Driving, Needs  assist Mobility Comments: Mod I with rollator, cane or crutches during prior admission; but endorses frequent falls. pt reports limited OOB mobility since DC home from SNF (reports therapy only worked on standing and stepping at bedside, not walking so pt unable to walk when discharged home) ADLs Comments: Unclear on ADL mgmt since DC from SNF. pt reports due to weakness, was using [facility] for toileting and neighbors and family bringing meals to pt. Functional Status:  Mobility: Bed Mobility Overal bed mobility: Needs Assistance Bed Mobility: Rolling, Sidelying to Sit Rolling: Used rails, Contact guard assist Sidelying to sit: Min assist Sit to sidelying: Mod assist General bed mobility comments: HOB elevated, use of rail, cues for use of LE to assist with roll, min A to elevate trunk Transfers Overall transfer level: Needs assistance Equipment used: Rolling walker (2 wheels) Transfers: Sit to/from Stand Sit to Stand: Mod assist, From elevated surface Bed to/from chair/wheelchair/BSC transfer type:: Step pivot Step pivot transfers: Min assist General transfer comment: ModA to boost up to standing position from elevated edge of bed and minA to rise from recliner with increased time Ambulation/Gait Ambulation/Gait assistance: Mod assist, +2 safety/equipment Gait Distance (Feet): 5 Feet (+ 5 ft) Assistive device: Rolling walker (2 wheels) Gait Pattern/deviations: Step-to pattern, Decreased stride length, Decreased dorsiflexion - right, Trunk flexed, Decreased weight shift to right, Knee hyperextension - right, Narrow base of support General Gait Details: Verbal cues for stepping initiation, sequencing/technique, manual assist for progression of RW. Pt heavily dependent on RW through arm. Decreased R foot clearance despite cues. noted R knee hyperextended at times. pt with inconsistent advancement of RLE and at times near-scissoring steps Gait velocity: decreased Gait velocity interpretation:  <1.31 ft/sec, indicative of household ambulator    ADL: ADL Overall ADL's : Needs assistance/impaired Eating/Feeding: Set up Grooming: Set up, Sitting Upper Body Bathing: Minimal assistance, Sitting Lower Body Bathing: Maximal assistance, Sitting/lateral leans, Sit to/from stand Upper Body Dressing : Minimal assistance, Sitting Lower Body Dressing: Minimal assistance Lower Body Dressing Details (indicate cue type and reason): Pt verbalized using a cane at home to don pants, educated on use of reacher and first in last out Toileting- Architect and Hygiene: Contact guard assist, Sit to/from stand, Cueing for compensatory techniques, Cueing for back precautions Toileting - Clothing Manipulation Details (indicate cue type and reason): Verbal and tacile cues used to maintainin back precautions General ADL Comments: Difficulty maintaing back precautions during ADLs  Cognition: Cognition Orientation Level: Oriented X4 Cognition Arousal: Alert Behavior During Therapy: WFL for tasks assessed/performed  Blood pressure (!) 145/76, pulse 73, temperature 98.5 F (36.9 C), temperature source Oral, resp. rate 16, height 5\' 2"  (1.575 m), weight 94.8 kg, last menstrual period 11/20/2012, SpO2 98%. Physical Exam  General: No apparent distress, laying in bed HEENT: Head is normocephalic, atraumatic, sclera anicteric, oral mucosa pink and moist, wearing glasses Neck: Supple without JVD or lymphadenopathy Heart: Reg rate and rhythm. No murmurs rubs or gallops Chest: CTA bilaterally without wheezes, rales, or rhonchi; no distress Abdomen: Soft, non-tender, non-distended, bowel sounds positive. Psych: Affect a little flat, pleasant and cooperative Skin: Warm and dry, wound VAC  in place to back Neuro:    Mental Status: AAOx3, follows commands, a little tangential at times CRANIAL NERVES: 2 through 12 grossly intact   MOTOR: RUE: 4/5 Deltoid, 4/5 Biceps, 4/5 Triceps,4/5 Grip LUE: 4/5  Deltoid, 4/5 Biceps, 4/5 Triceps, 4/5 Grip RLE: HF 2-3/5, KE 4-/5, ADF 2-3/5, APF 3/5 LLE: HF 3/5, KE 4-/5, ADF 3/5, APF 4-/5  SENSORY: Sensation altered to light touch in bilateral lower extremities  MSK: wearing air cast on R ankle Bilateral healed TKA incisions    No results found for this or any previous visit (from the past 24 hours). DG CHEST PORT 1 VIEW Result Date: 01/13/2024 CLINICAL DATA:  Status post PICC placement. EXAM: PORTABLE CHEST 1 VIEW COMPARISON:  Chest radiograph dated 11/26/2023 FINDINGS: Right-sided PICC with tip over central SVC. Bilateral linear atelectasis. No focal consolidation, pleural effusion, pneumothorax. Stable cardiac silhouette no acute osseous pathology. Spinal hardware. IMPRESSION: Right-sided PICC with tip over central SVC. Electronically Signed   By: Elgie Collard M.D.   On: 01/13/2024 16:11    Assessment/Plan: Diagnosis: Severe spinal canal stenosis due to T9 discitis/osteomyelitis and T9 epidural phlegmon/abscess s/p T9/10 laminectomy and decompression by Dr. Franky Macho. Does the need for close, 24 hr/day medical supervision in concert with the patient's rehab needs make it unreasonable for this patient to be served in a less intensive setting? Yes Co-Morbidities requiring supervision/potential complications:  -Hypertension, GERD, obesity, fibromyalgia, right ankle dysfunction, hypothyroidism versus euthyroid sick syndrome, chronic lower back pain Due to bladder management, bowel management, safety, skin/wound care, disease management, medication administration, pain management, and patient education, does the patient require 24 hr/day rehab nursing? Yes Does the patient require coordinated care of a physician, rehab nurse, therapy disciplines of PT/OT to address physical and functional deficits in the context of the above medical diagnosis(es)? Yes Addressing deficits in the following areas: balance, endurance, locomotion, strength, transferring,  bowel/bladder control, bathing, dressing, feeding, grooming, toileting, and psychosocial support Can the patient actively participate in an intensive therapy program of at least 3 hrs of therapy per day at least 5 days per week? Yes The potential for patient to make measurable gains while on inpatient rehab is excellent Anticipated functional outcomes upon discharge from inpatient rehab are supervision and min assist  with PT, supervision and min assist with OT, n/a with SLP. Estimated rehab length of stay to reach the above functional goals is: 10-12 Anticipated discharge destination: Home Overall Rehab/Functional Prognosis: good  POST ACUTE RECOMMENDATIONS: This patient's condition is appropriate for continued rehabilitative care in the following setting: CIR Patient has agreed to participate in recommended program. Yes Note that insurance prior authorization may be required for reimbursement for recommended care.  Comment: I think patient would be a  candidate for CIR.  Would like to confirm disposition options.  Rehab coordinator to follow-up.   MEDICAL RECOMMENDATIONS: Consider checking PVR/bladder scans due to bladder incontinence.   I have personally performed a face to face diagnostic evaluation of this patient. Additionally, I have examined the patient's medical record including any pertinent labs and radiographic images.    Thanks,  Fanny Dance, MD 01/14/2024

## 2024-01-14 NOTE — Progress Notes (Signed)
 Physical Therapy Treatment Patient Details Name: [name] MRN: [id number] DOB: 05-19-1961 Today's Date: 01/14/2024   History of Present Illness Pt admitted 4/3 for T9-10 laminectomy in setting of stenosis. Recent admission 2/19-2/27 due to possible discitis and underwent T9-T10 aspiration. Presented 3/25-2/26 for PICC line replacement after accidental dislodgement. PMH: anemia, anxiety, fibromyalgia, GERD, HTN, hyperthyroidism, IBS, ACDF 3/4, Posterior lateral arthrodesis 6/16, CSF leak 6/24.    PT Comments  The pt continues to make slow but steady progress with mobility and RLE strength. The pt continues to demo weakness in RLE DF, knee extension, and hip flexion, which impact toe clearance and stability in gait. Pt limited to 2 x 5 ft walking bouts due to heaviness and increased errors/instability in RLE as she fatigues. Pt able to tolerate sitting exercises between walking bouts. Pt encouraged to continue with HEP for BLE and verbalized understanding. Continue to recommend intensive therapies >3hours/day after d/c as pt hopeful to return to full independence despite drastic decline from baseline. Pt also is at high risk of continued falls given weakness in RLE at this time.     If plan is discharge home, recommend the following: A lot of help with walking and/or transfers;A lot of help with bathing/dressing/bathroom;Assistance with cooking/housework;Assist for transportation;Help with stairs or ramp for entrance   Can travel by private vehicle        Equipment Recommendations  Wheelchair (measurements PT)    Recommendations for Other Services       Precautions / Restrictions Precautions Precautions: Fall;Back Precaution Booklet Issued: Yes (comment) Recall of Precautions/Restrictions: Impaired Precaution/Restrictions Comments: pt able to verbalize precautions, but not adhere during movement despite cues Restrictions Weight Bearing Restrictions Per Provider Order: No      Mobility  Bed Mobility Overal bed mobility: Needs Assistance Bed Mobility: Rolling, Sidelying to Sit Rolling: Used rails, Contact guard assist Sidelying to sit: Min assist       General bed mobility comments: HOB elevated, use of rail, cues for use of LE to assist with roll, min A to elevate trunk    Transfers Overall transfer level: Needs assistance Equipment used: Rolling walker (2 wheels) Transfers: Sit to/from Stand Sit to Stand: Mod assist, From elevated surface           General transfer comment: ModA to boost up to standing position from elevated edge of bed and minA to rise from recliner with increased time    Ambulation/Gait Ambulation/Gait assistance: Mod assist, +2 safety/equipment Gait Distance (Feet): 5 Feet (+ 5 ft) Assistive device: Rolling walker (2 wheels) Gait Pattern/deviations: Step-to pattern, Decreased stride length, Decreased dorsiflexion - right, Trunk flexed, Decreased weight shift to right, Knee hyperextension - right, Narrow base of support Gait velocity: decreased Gait velocity interpretation: <1.31 ft/sec, indicative of household ambulator   General Gait Details: Verbal cues for stepping initiation, sequencing/technique, manual assist for progression of RW. Pt heavily dependent on RW through arm. Decreased R foot clearance despite cues. noted R knee hyperextended at times. pt with inconsistent advancement of RLE and at times near-scissoring steps   Stairs             Wheelchair Mobility     Tilt Bed    Modified Rankin (Stroke Patients Only)       Balance Overall balance assessment: Needs assistance, History of Falls Sitting-balance support: Feet supported, No upper extremity supported Sitting balance-[name]: Fair     Standing balance support: Bilateral upper extremity supported, During functional activity Standing balance-[name]: Poor Standing  balance comment: dependent on BUE support                             Communication Communication Communication: No apparent difficulties  Cognition Arousal: Alert Behavior During Therapy: WFL for tasks assessed/performed   PT - Cognitive impairments: No family/caregiver present to determine baseline                       PT - Cognition Comments: tangential, poor adherence to spinal precautions with movement. repeated cues for technique and movements with little carryover Following commands: Intact      Cueing Cueing Techniques: Verbal cues, Gestural cues  Exercises General Exercises - Lower Extremity Ankle Circles/Pumps: AROM, Both, 10 reps, Supine Quad Sets: Both, 10 reps, Supine Long Arc Quad: AROM, Both, 10 reps, Seated (2-3 sec hold) Heel Slides: AAROM, Both, 10 reps, Supine Hip Flexion/Marching: AROM, Both, 10 reps, Seated Toe Raises: AROM, Both, 20 reps, Seated    General Comments General comments (skin integrity, edema, etc.): VSS on RA      Pertinent Vitals/Pain Pain Assessment Pain Assessment: Faces Faces Pain Scale: Hurts little more Pain Location: back Pain Descriptors / Indicators: Discomfort Pain Intervention(s): Limited activity within patient's tolerance, Monitored during session, Repositioned     PT Goals (current goals can now be found in the care plan section) Acute Rehab PT Goals Patient Stated Goal: return to independence with better safety PT Goal Formulation: With patient Time For Goal Achievement: 01/23/24 Potential to Achieve Goals: Good Progress towards PT goals: Progressing toward goals    Frequency    Min 5X/week       AM-PAC PT "6 Clicks" Mobility   Outcome Measure  Help needed turning from your back to your side while in a flat bed without using bedrails?: A Little Help needed moving from lying on your back to sitting on the side of a flat bed without using bedrails?: A Little Help needed moving to and from a bed to a chair (including a wheelchair)?: A Lot Help needed standing  up from a chair using your arms (e.g., wheelchair or bedside chair)?: A Lot Help needed to walk in hospital room?: Total (<20 ft) Help needed climbing 3-5 steps with a railing? : Total 6 Click Score: 12    End of Session Equipment Utilized During Treatment: Gait belt Activity Tolerance: Patient tolerated treatment well Patient left: in chair;with call bell/phone within reach;with chair alarm set Nurse Communication: Mobility status PT Visit Diagnosis: Unsteadiness on feet (R26.81);Other abnormalities of gait and mobility (R26.89);Repeated falls (R29.6);Muscle weakness (generalized) (M62.81);Pain Pain - part of body:  (back)     Time: 1116-1202 PT Time Calculation (min) (ACUTE ONLY): 46 min  Charges:    $Gait Training: 8-22 mins $Therapeutic Exercise: 8-22 mins $Therapeutic Activity: 8-22 mins PT General Charges $$ ACUTE PT VISIT: 1 Visit                     Vickki Muff, PT, DPT   Acute Rehabilitation Department Office [phone] Secure Chat Communication Preferred   Ronnie Derby 01/14/2024, 1:21 PM

## 2024-01-14 NOTE — Progress Notes (Signed)
 No blood returned on the purple port, but there is order for D/C PICC. Patient wanted to talk to MD regarding ABX therapy. If patient needs to keep the PICC then will put in the tPA one more time on purple port. HS [employer]

## 2024-01-15 LAB — BASIC METABOLIC PANEL WITH GFR
Anion gap: 11 (ref 5–15)
BUN: 14 mg/dL (ref 8–23)
CO2: 31 mmol/L (ref 22–32)
Calcium: 9 mg/dL (ref 8.9–10.3)
Chloride: 98 mmol/L (ref 98–111)
Creatinine, Ser: 0.54 mg/dL (ref 0.44–1.00)
GFR, Estimated: >60 mL/min (ref >60)
Glucose, Bld: 81 mg/dL (ref 70–99)
Potassium: 3 mmol/L — ABNORMAL LOW (ref 3.5–5.1)
Sodium: 140 mmol/L (ref 135–145)

## 2024-01-15 NOTE — Progress Notes (Signed)
 Occupational Therapy Treatment Patient Details Name: [name] MRN: [id number] DOB: [birth year] Today's Date: 01/15/2024   History of present illness Pt admitted 4/3 for T9-10 laminectomy in setting of stenosis. Recent admission 2/19-2/27 due to possible discitis and underwent T9-T10 aspiration. Presented 3/25-2/26 for PICC line replacement after accidental dislodgement. PMH: anemia, anxiety, fibromyalgia, GERD, HTN, hyperthyroidism, IBS, ACDF 3/4, Posterior lateral arthrodesis 6/16, CSF leak 6/24.   OT comments  Pt progressing well towards goals. Pt highly motivated to progress in therapy. Pt made improvements with functional mobility to min assist, but still requiring cueing for sequencing. Grooming completed seated in recliner. Pt shows improved safety awareness, but still difficulty maintaining back precautions during mobility.  Decreased strength, balance, and activity tolerance continue to limit pt. Continue to recommend >3 hours of skilled rehab daily to optimize independence levels. Will continue to follow acutely.       If plan is discharge home, recommend the following:  A lot of help with walking and/or transfers;A lot of help with bathing/dressing/bathroom (Defer to next venue)   Equipment Recommendations  Other (comment)    Recommendations for Other Services Rehab consult    Precautions / Restrictions Precautions Precautions: Fall;Back Precaution Booklet Issued: Yes (comment) Recall of Precautions/Restrictions: Impaired Precaution/Restrictions Comments: pt able to verbalize precautions, but not adhere during movement despite cues Restrictions Weight Bearing Restrictions Per Provider Order: No       Mobility Bed Mobility Overal bed mobility: Needs Assistance Bed Mobility: Rolling, Sidelying to Sit Rolling: Used rails, Contact guard assist Sidelying to sit: Min assist       General bed mobility comments: Assist for Bil LEs    Transfers Overall transfer  level: Needs assistance Equipment used: Rolling walker (2 wheels) Transfers: Sit to/from Stand, Bed to chair/wheelchair/BSC Sit to Stand: Min assist, From elevated surface     Step pivot transfers: Min assist     General transfer comment: Min assist to stand, heavy cueing for sequencing     Balance Overall balance assessment: Needs assistance, History of Falls Sitting-balance support: Feet supported, No upper extremity supported Sitting balance-[name]: Fair     Standing balance support: Bilateral upper extremity supported, During functional activity Standing balance-[name]: Poor Standing balance comment: Reliant on RW     ADL either performed or assessed with clinical judgement   ADL Overall ADL's : Needs assistance/impaired Eating/Feeding: Set up;Sitting   Grooming: Wash/dry face;Oral care;Set up;Sitting                   Toilet Transfer: Minimal assistance;Ambulation;Rolling walker (2 wheels) Toilet Transfer Details (indicate cue type and reason): Simulated in room 3 steps towards recliner         Functional mobility during ADLs: Minimal assistance;Rolling walker (2 wheels);Cueing for sequencing General ADL Comments: Difficulty maintaing back precautions during ADLs    Extremity/Trunk Assessment Upper Extremity Assessment Upper Extremity Assessment: Generalized weakness;Right hand dominant   Lower Extremity Assessment Lower Extremity Assessment: Defer to PT evaluation        Vision   Vision Assessment?: No apparent visual deficits         Communication Communication Communication: No apparent difficulties   Cognition Arousal: Alert Behavior During Therapy: WFL for tasks assessed/performed Cognition: No family/caregiver present to determine baseline         OT - Cognition Comments: pleasant, some decreased insight into safety, deficits and safe mgmt at home.           Following commands: Intact  Cueing   Cueing  Techniques: Verbal cues, Gestural cues        General Comments VSS on RA    Pertinent Vitals/ Pain       Pain Assessment Pain Assessment: Faces Faces Pain Scale: Hurts little more Pain Location: back Pain Descriptors / Indicators: Discomfort Pain Intervention(s): Monitored during session, Premedicated before session, Repositioned   Frequency  Min 2X/week        Progress Toward Goals  OT Goals(current goals can now be found in the care plan section)  Progress towards OT goals: Progressing toward goals  Acute Rehab OT Goals Patient Stated Goal: None stated OT Goal Formulation: With patient Time For Goal Achievement: 01/23/24 Potential to Achieve Goals: Good ADL Goals Pt Will Perform Lower Body Bathing: with min assist;sit to/from stand;sitting/lateral leans Pt Will Perform Lower Body Dressing: with min assist;sit to/from stand;sitting/lateral leans Pt Will Transfer to Toilet: with supervision;stand pivot transfer;bedside commode Pt Will Perform Toileting - Clothing Manipulation and hygiene: with mod assist;sitting/lateral leans;sit to/from stand Additional ADL Goal #1: Pt to complete bed mobility with Min A in prep for EOB/OOB ADLs  Plan      Co-evaluation                 AM-PAC OT "6 Clicks" Daily Activity     Outcome Measure   Help from another person eating meals?: None Help from another person taking care of personal grooming?: A Little Help from another person toileting, which includes using toliet, bedpan, or urinal?: A Lot Help from another person bathing (including washing, rinsing, drying)?: A Lot Help from another person to put on and taking off regular upper body clothing?: A Little Help from another person to put on and taking off regular lower body clothing?: A Lot 6 Click Score: 16    End of Session Equipment Utilized During Treatment: Gait belt;Rolling walker (2 wheels)  OT Visit Diagnosis: Other abnormalities of gait and mobility  (R26.89);Unsteadiness on feet (R26.81);History of falling (Z91.81)   Activity Tolerance Patient tolerated treatment well   Patient Left in chair;with call bell/phone within reach;with chair alarm set   Nurse Communication Mobility status        Time: 1610-9604 OT Time Calculation (min): 35 min  Charges: OT General Charges $OT Visit: 1 Visit OT Treatments $Self Care/Home Management : 23-37 mins  Ivor Messier, OT  Acute Rehabilitation Services Office [phone] Secure chat preferred   Marilynne Drivers 01/15/2024, 8:32 AM

## 2024-01-15 NOTE — Progress Notes (Signed)
 Physical Therapy Treatment Patient Details Name: [name] MRN: [id number] DOB: 1960/10/23 Today's Date: 01/15/2024   History of Present Illness Pt admitted 4/3 for T9-10 laminectomy in setting of stenosis. Recent admission 2/19-2/27 due to possible discitis and underwent T9-T10 aspiration. Presented 3/25-2/26 for PICC line replacement after accidental dislodgement. PMH: anemia, anxiety, fibromyalgia, GERD, HTN, hyperthyroidism, IBS, ACDF 3/4, Posterior lateral arthrodesis 6/16, CSF leak 6/24.    PT Comments  Patient resting in recliner, agreeable to work with therapy and motivated to progress, ready to return to bed after mobilizing. Pt required Min assist and cues for technique to power up from recliner, reliant on bil UE's and cues needed for maintaining spinal precautions though pt able to verbalize 3/3 without cuing. Pt amb short forward bout with min assist and RW, gentle guarding at Rt knee in stance to ensure extension. Pt required min-mod assist to guide walker turn to back up to bed. Seated rest taken prior to lateral steps along EOB for hip activation. Pt attempted heel/toe raises in standing with some activation noted on Lt LE but none observed on Rt; pt able to activate both for partial ROM in sitting. EOS pt preferred to remain seated EOB for RN to provide pain medication. Will continue to progress pt as able during acute stay. Patient will benefit from intensive inpatient follow-up therapy, >3 hours/day.   If plan is discharge home, recommend the following: A lot of help with walking and/or transfers;A lot of help with bathing/dressing/bathroom;Assistance with cooking/housework;Assist for transportation;Help with stairs or ramp for entrance   Can travel by private vehicle        Equipment Recommendations  Wheelchair (measurements PT)    Recommendations for Other Services Rehab consult     Precautions / Restrictions Precautions Precautions: Fall;Back Precaution Booklet  Issued: Yes (comment) Recall of Precautions/Restrictions: Impaired Precaution/Restrictions Comments: pt able to verbalize precautions, but not adhere during movement despite cues Restrictions Weight Bearing Restrictions Per Provider Order: No     Mobility  Bed Mobility Overal bed mobility: Needs Assistance             General bed mobility comments: pt deferred sit>supine to wait for RN to bring pain meds at EOS.    Transfers Overall transfer level: Needs assistance Equipment used: Rolling walker (2 wheels) Transfers: Sit to/from Stand, Bed to chair/wheelchair/BSC Sit to Stand: Min assist, From elevated surface   Step pivot transfers: Min assist       General transfer comment: cues for back precautions, cues for bil UE use to power up from armrest of chair and slighlty elevated EOB.    Ambulation/Gait Ambulation/Gait assistance: Min assist, Mod assist Gait Distance (Feet): 5 Feet Assistive device: Rolling walker (2 wheels) Gait Pattern/deviations: Step-to pattern, Decreased stride length, Decreased dorsiflexion - right, Trunk flexed, Decreased weight shift to right, Knee hyperextension - right, Narrow base of support Gait velocity: decreased     General Gait Details: min-mod assist for short forward gait from chair and turn to back up to bed. Pt min assist for Rt LE flexion to initiate stepping. Pt took lateral steps along EOB to move Lt and Rt, ending at [facility].   Stairs             Wheelchair Mobility     Tilt Bed    Modified Rankin (Stroke Patients Only)       Balance Overall balance assessment: Needs assistance, History of Falls Sitting-balance support: Feet supported, No upper extremity supported Sitting balance-[name]: Fair  Standing balance support: Bilateral upper extremity supported, During functional activity Standing balance-[name]: Poor Standing balance comment: Reliant on RW                             Communication Communication Communication: No apparent difficulties  Cognition Arousal: Alert Behavior During Therapy: WFL for tasks assessed/performed   PT - Cognitive impairments: No family/caregiver present to determine baseline                         Following commands: Intact      Cueing Cueing Techniques: Verbal cues, Gestural cues  Exercises General Exercises - Lower Extremity Toe Raises: AROM, 10 reps, Seated, Standing, Limitations Toe Raises Limitations: attempted standing, pt able to lift slightly on Lt not on Rt Heel Raises: AROM, Both, 10 reps, Seated, Standing, Limitations Heel Raises Limitations: attempted standing, pt able to lift slightly on Lt not on Rt    General Comments        Pertinent Vitals/Pain Pain Assessment Pain Assessment: Faces Faces Pain Scale: Hurts little more Pain Location: back Pain Descriptors / Indicators: Discomfort Pain Intervention(s): Limited activity within patient's tolerance, Monitored during session, Repositioned    Home Living                          Prior Function            PT Goals (current goals can now be found in the care plan section) Acute Rehab PT Goals Patient Stated Goal: return to independence with better safety PT Goal Formulation: With patient Time For Goal Achievement: 01/23/24 Potential to Achieve Goals: Good Progress towards PT goals: Progressing toward goals    Frequency    Min 5X/week      PT Plan      Co-evaluation              AM-PAC PT "6 Clicks" Mobility   Outcome Measure  Help needed turning from your back to your side while in a flat bed without using bedrails?: A Little Help needed moving from lying on your back to sitting on the side of a flat bed without using bedrails?: A Little Help needed moving to and from a bed to a chair (including a wheelchair)?: A Lot Help needed standing up from a chair using your arms (e.g., wheelchair or bedside chair)?: A  Little Help needed to walk in hospital room?: Total Help needed climbing 3-5 steps with a railing? : Total 6 Click Score: 13    End of Session Equipment Utilized During Treatment: Gait belt Activity Tolerance: Patient tolerated treatment well Patient left: in chair;with call bell/phone within reach;with chair alarm set Nurse Communication: Mobility status PT Visit Diagnosis: Unsteadiness on feet (R26.81);Other abnormalities of gait and mobility (R26.89);Repeated falls (R29.6);Muscle weakness (generalized) (M62.81);Pain Pain - Right/Left: Right     Time: 1617-1700 PT Time Calculation (min) (ACUTE ONLY): 43 min  Charges:    $Therapeutic Exercise: 23-37 mins $Therapeutic Activity: 23-37 mins PT General Charges $$ ACUTE PT VISIT: 1 Visit                     Wynn Maudlin, DPT Acute Rehabilitation Services Office [phone]  01/15/24 5:16 PM

## 2024-01-15 NOTE — Progress Notes (Signed)
 Patient ID: [name], female   DOB: [birth year], 62 y.o.   MRN: [id number] BP (!) 142/73 (BP Location: Right Arm)   Pulse 73   Temp 98.2 F (36.8 C) (Oral)   Resp 16   Ht 5\' 2"  (1.575 m)   Wt 94.8 kg   LMP 11/20/2012   SpO2 96%   BMI 38.23 kg/m  Alert and oriented x 4 Did well with PT today. Rehab seems interested.  Wound vac in place

## 2024-01-15 NOTE — Progress Notes (Signed)
 Inpatient Rehab Coordinator Note:  I met with patient at bedside to discuss CIR recommendations and goals/expectations of CIR stay.  We reviewed 3 hrs/day of therapy, physician follow up, and average length of stay 2 weeks (dependent upon progress) with goals of supervision to min assist.  We discussed what kind of caregiver support she may have at discharge and different options including friends and having her son come from [place].  She is going to contact her friend Dorcas Carrow to see if she could stay with her at discharge.  We discussed need for insurance approval and I will start that process today.  Will also try to touch base with son to confirm plans going forward.   Estill Dooms, PT, DPT Admissions Coordinator [phone] 01/15/24  3:11 PM

## 2024-01-15 NOTE — Plan of Care (Signed)

## 2024-01-16 NOTE — Progress Notes (Signed)
 Inpatient Rehab Admissions Coordinator:   Received a request for peer to peer by Monday 1030 AM.  Dr. Riley Kill can complete Monday or attending can complete over weekend by calling [phone] option 5, will need to provide name/DOB as well as policy number ([id number]).    Estill Dooms, PT, DPT Admissions Coordinator [phone] 01/16/24  4:12 PM

## 2024-01-16 NOTE — TOC Progression Note (Signed)
 Transition of Care [facility]) - Progression Note    Patient Details  Name: [name] MRN: [id number] Date of Birth: 03-09-1961  [facility] CM/SW Contact  Gordy Clement, RN Phone Number: 01/16/2024, 2:29 PM  Clinical Narrative:     Chart review- Auth requested today for CIR     Expected Discharge Plan:  (TBD) Barriers to Discharge: Continued Medical Work up  Expected Discharge Plan and Services In-house Referral: Clinical Social Work     Living arrangements for the past 2 months: Single Family Home                                       Social Determinants of Health (SDOH) Interventions SDOH Screenings   Food Insecurity: No Food Insecurity (01/16/2024)  Housing: Low Risk  (01/16/2024)  Transportation Needs: No Transportation Needs (01/16/2024)  Utilities: Not At Risk (01/16/2024)  Depression (PHQ2-9): Low Risk  (01/12/2024)  Tobacco Use: Medium Risk (01/08/2024)    Readmission Risk Interventions    05/17/2022    3:02 PM  Readmission Risk Prevention Plan  Transportation Screening Complete  PCP or Specialist Appt within 5-7 Days Complete  Home Care Screening Complete  Medication Review (RN CM) Complete

## 2024-01-16 NOTE — Progress Notes (Signed)
 Physical Therapy Treatment Patient Details Name: [name] MRN: [id number] DOB: October 30, 1960 Today's Date: 01/16/2024   History of Present Illness Pt admitted 4/3 for T9-10 laminectomy in setting of stenosis. Recent admission 2/19-2/27 due to possible discitis and underwent T9-T10 aspiration. Presented 3/25-2/26 for PICC line replacement after accidental dislodgement. PMH: anemia, anxiety, fibromyalgia, GERD, HTN, hyperthyroidism, IBS, ACDF 3/4, Posterior lateral arthrodesis 6/16, CSF leak 6/24.    PT Comments  Patient resting in bed and agreeable to mobilize with PT with goal to progress gait bouts and distance. Pt recalled 3/3 spinal precautions. Pt able to move supine>side>sit with CGA and use of bed features. EOB elevated slightly for sit<>stand and pt power up with bil UE's and min assist to rise. Pt took small steps towards recliner and seated rest provided prior to bouts of gait. 2x6' and 2x3' completed with RW, min assist initially with mod assist for Rt LE advancement and placement as pt fatigued. Burning/pain in back limiting gait distance today. EOS pt repositioned in recliner for comfort and agreeable to remain OOB. Will continue to progress pt as able during acute stay. Patient will benefit from intensive inpatient follow-up therapy, >3 hours/day.    If plan is discharge home, recommend the following: A lot of help with walking and/or transfers;A lot of help with bathing/dressing/bathroom;Assistance with cooking/housework;Assist for transportation;Help with stairs or ramp for entrance   Can travel by private vehicle        Equipment Recommendations  Wheelchair (measurements PT)    Recommendations for Other Services Rehab consult     Precautions / Restrictions Precautions Precautions: Fall;Back Precaution Booklet Issued: Yes (comment) Recall of Precautions/Restrictions: Impaired Precaution/Restrictions Comments: pt able to verbalize precautions, but not adhere during  movement despite cues Restrictions Weight Bearing Restrictions Per Provider Order: No     Mobility  Bed Mobility Overal bed mobility: Needs Assistance Bed Mobility: Rolling, Sidelying to Sit Rolling: Contact guard assist, Used rails Sidelying to sit: HOB elevated, Used rails, Contact guard assist       General bed mobility comments: cues for maintaining spinal precautions, pt able to verbalize "BLT" without cues. no    Transfers Overall transfer level: Needs assistance Equipment used: Rolling walker (2 wheels) Transfers: Sit to/from Stand, Bed to chair/wheelchair/BSC Sit to Stand: Min assist, From elevated surface   Step pivot transfers: Min assist       General transfer comment: cues for back precautions, cues for bil UE use to power up from armrest of chair and slighlty elevated EOB.    Ambulation/Gait Ambulation/Gait assistance: Min assist, Mod assist Gait Distance (Feet): 6 Feet (2x6', 2x3') Assistive device: Rolling walker (2 wheels) Gait Pattern/deviations: Step-to pattern, Decreased stride length, Decreased dorsiflexion - right, Trunk flexed, Decreased weight shift to right, Knee hyperextension - right, Narrow base of support Gait velocity: decreased     General Gait Details: pt advanceing Rt LE with poor dorsiflexion noted. As pt fatigued greater difficulty noetd to lift and prevent adduction, mod assist provided for placement and stability in stance.   Stairs             Wheelchair Mobility     Tilt Bed    Modified Rankin (Stroke Patients Only)       Balance Overall balance assessment: Needs assistance, History of Falls Sitting-balance support: Feet supported, No upper extremity supported Sitting balance-[name]: Fair     Standing balance support: Bilateral upper extremity supported, During functional activity Standing balance-[name]: Poor Standing balance comment: Reliant on RW  Communication Communication Communication: No apparent difficulties  Cognition Arousal: Alert Behavior During Therapy: WFL for tasks assessed/performed   PT - Cognitive impairments: No family/caregiver present to determine baseline                         Following commands: Intact      Cueing Cueing Techniques: Verbal cues, Gestural cues  Exercises      General Comments        Pertinent Vitals/Pain Pain Assessment Pain Assessment: Faces Faces Pain Scale: Hurts even more Pain Location: back Pain Descriptors / Indicators: Discomfort Pain Intervention(s): Limited activity within patient's tolerance, Monitored during session, Repositioned    Home Living Family/patient expects to be discharged to:: Private residence Living Arrangements: Alone                      Prior Function            PT Goals (current goals can now be found in the care plan section) Acute Rehab PT Goals Patient Stated Goal: return to independence with better safety PT Goal Formulation: With patient Time For Goal Achievement: 01/23/24 Potential to Achieve Goals: Good Progress towards PT goals: Progressing toward goals    Frequency    Min 5X/week      PT Plan      Co-evaluation              AM-PAC PT "6 Clicks" Mobility   Outcome Measure  Help needed turning from your back to your side while in a flat bed without using bedrails?: A Little Help needed moving from lying on your back to sitting on the side of a flat bed without using bedrails?: A Little Help needed moving to and from a bed to a chair (including a wheelchair)?: A Lot Help needed standing up from a chair using your arms (e.g., wheelchair or bedside chair)?: A Little Help needed to walk in hospital room?: Total Help needed climbing 3-5 steps with a railing? : Total 6 Click Score: 13    End of Session Equipment Utilized During Treatment: Gait belt Activity Tolerance: Patient tolerated treatment  well Patient left: in chair;with call bell/phone within reach;with chair alarm set Nurse Communication: Mobility status PT Visit Diagnosis: Unsteadiness on feet (R26.81);Other abnormalities of gait and mobility (R26.89);Repeated falls (R29.6);Muscle weakness (generalized) (M62.81);Pain Pain - Right/Left: Right     Time: 8657-8469 PT Time Calculation (min) (ACUTE ONLY): 40 min  Charges:    $Gait Training: 23-37 mins $Therapeutic Activity: 8-22 mins PT General Charges $$ ACUTE PT VISIT: 1 Visit                     Wynn Maudlin, DPT Acute Rehabilitation Services Office [phone]  01/16/24 2:21 PM

## 2024-01-16 NOTE — Progress Notes (Signed)
 Inpatient Rehab Admissions Coordinator:   Auth request for CIR pending with Home and [facility] Medicare.  We will continue to follow.   Estill Dooms, PT, DPT Admissions Coordinator [phone] 01/16/24  11:10 AM

## 2024-01-16 NOTE — Progress Notes (Signed)
 Patient ID: [name], female   DOB: Dec 22, 1960, 63 y.o.   MRN: [id number] BP (!) 144/80 (BP Location: Left Arm)   Pulse 64   Temp 99.6 F (37.6 C) (Oral)   Resp 19   Ht 5\' 2"  (1.575 m)   Wt 94.8 kg   LMP 11/20/2012   SpO2 97%   BMI 38.23 kg/m  Alert and oriented x4, speech is clear and fluent Moving all extremities well Wound vac in place Possible cir

## 2024-01-17 ENCOUNTER — Other Ambulatory Visit (HOSPITAL_COMMUNITY): Payer: Self-pay

## 2024-01-17 NOTE — Progress Notes (Signed)
 No new issues or problems.  Wound VAC still in place.  Pain well-controlled.  No new neurologic symptoms.  Continue current management and rehab efforts.

## 2024-01-18 NOTE — Plan of Care (Signed)

## 2024-01-18 NOTE — Progress Notes (Signed)
   Providing Compassionate, Quality Care - Together   Subjective: Patient reports no new issues.  Objective: Vital signs in last 24 hours: Temp:  [97.9 F (36.6 C)-98.4 F (36.9 C)] 98.4 F (36.9 C) (04/13 0858) Pulse Rate:  [73-80] 73 (04/13 0858) Resp:  [18] 18 (04/13 0443) BP: (136-149)/(73-86) 149/79 (04/13 0858) SpO2:  [97 %-100 %] 100 % (04/13 0858)  Intake/Output from previous day: 04/12 0701 - 04/13 0700 In: 1077 [P.O.:477; IV Piggyback:600] Out: 2200 [Urine:2200] Intake/Output this shift: Total I/O In: -  Out: 800 [Urine:800]  Alert and oriented x 4 PERRLA CN II-XII grossly intact MAE, Strength and sensation intact BUE, generalized weakness in BLE Wound VAC in place  Lab Results: No results for input(s): "WBC", "HGB", "HCT", "PLT" in the last 72 hours. BMET No results for input(s): "NA", "K", "CL", "CO2", "GLUCOSE", "BUN", "CREATININE", "CALCIUM" in the last 72 hours.  Studies/Results: No results found.  Assessment/Plan: Patient underwent decompression at T9-10 by Dr. Michale Age on 01/08/2024. She has a history of discitis/osteomyelitis that grew Enterobacter cloacae from a wound culture performed on 04/08/2022. Wound VAC was placed in the OR. Patient has been receiving cefepime IV during hospitalization and will transition to oral ciprofloxacin and cefadroxil at discharge. Plan is for discharge to CIR once wound VAC is removed.   LOS: 5 days   I am in communication with my attending and they agree with the plan for this patient.   Henreitta Locus, DNP, AGNP-C Nurse Practitioner  [facility] [address], [place], [name] [address] P: [phone]    F: [phone]  01/18/2024, 12:10 PM

## 2024-01-18 NOTE — Plan of Care (Signed)

## 2024-01-19 ENCOUNTER — Encounter (HOSPITAL_COMMUNITY): Payer: Self-pay | Admitting: Physical Medicine and Rehabilitation

## 2024-01-19 ENCOUNTER — Other Ambulatory Visit (HOSPITAL_COMMUNITY): Payer: Self-pay

## 2024-01-19 ENCOUNTER — Other Ambulatory Visit: Payer: Self-pay

## 2024-01-19 ENCOUNTER — Inpatient Hospital Stay (HOSPITAL_COMMUNITY)
Admission: AD | Admit: 2024-01-19 | Discharge: 2024-02-02 | DRG: 552 | Disposition: A | Discharge status: Home Health | Source: Intra-hospital | Attending: Physical Medicine and Rehabilitation | Admitting: Physical Medicine and Rehabilitation

## 2024-01-19 DIAGNOSIS — G992 Myelopathy in diseases classified elsewhere: Secondary | ICD-10-CM | POA: Diagnosis present

## 2024-01-19 DIAGNOSIS — J3081 Allergic rhinitis due to animal (cat) (dog) hair and dander: Secondary | ICD-10-CM | POA: Diagnosis present

## 2024-01-19 DIAGNOSIS — M4714 Other spondylosis with myelopathy, thoracic region: Secondary | ICD-10-CM | POA: Insufficient documentation

## 2024-01-19 DIAGNOSIS — K219 Gastro-esophageal reflux disease without esophagitis: Secondary | ICD-10-CM | POA: Diagnosis present

## 2024-01-19 DIAGNOSIS — N3281 Overactive bladder: Secondary | ICD-10-CM | POA: Diagnosis present

## 2024-01-19 DIAGNOSIS — M4804 Spinal stenosis, thoracic region: Secondary | ICD-10-CM | POA: Diagnosis present

## 2024-01-19 DIAGNOSIS — G8929 Other chronic pain: Secondary | ICD-10-CM | POA: Diagnosis present

## 2024-01-19 DIAGNOSIS — D62 Acute posthemorrhagic anemia: Secondary | ICD-10-CM | POA: Diagnosis present

## 2024-01-19 DIAGNOSIS — E669 Obesity, unspecified: Secondary | ICD-10-CM | POA: Diagnosis present

## 2024-01-19 DIAGNOSIS — Z6841 Body Mass Index (BMI) 40.0 and over, adult: Secondary | ICD-10-CM | POA: Diagnosis not present

## 2024-01-19 DIAGNOSIS — G4700 Insomnia, unspecified: Secondary | ICD-10-CM | POA: Diagnosis present

## 2024-01-19 DIAGNOSIS — Z87891 Personal history of nicotine dependence: Secondary | ICD-10-CM

## 2024-01-19 DIAGNOSIS — T8140XA Infection following a procedure, unspecified, initial encounter: Secondary | ICD-10-CM | POA: Diagnosis present

## 2024-01-19 DIAGNOSIS — Z96653 Presence of artificial knee joint, bilateral: Secondary | ICD-10-CM | POA: Diagnosis present

## 2024-01-19 DIAGNOSIS — K589 Irritable bowel syndrome without diarrhea: Secondary | ICD-10-CM | POA: Diagnosis present

## 2024-01-19 DIAGNOSIS — M4800 Spinal stenosis, site unspecified: Secondary | ICD-10-CM | POA: Diagnosis present

## 2024-01-19 DIAGNOSIS — R269 Unspecified abnormalities of gait and mobility: Secondary | ICD-10-CM | POA: Diagnosis present

## 2024-01-19 DIAGNOSIS — K592 Neurogenic bowel, not elsewhere classified: Secondary | ICD-10-CM

## 2024-01-19 DIAGNOSIS — D649 Anemia, unspecified: Secondary | ICD-10-CM | POA: Diagnosis present

## 2024-01-19 DIAGNOSIS — G4733 Obstructive sleep apnea (adult) (pediatric): Secondary | ICD-10-CM | POA: Diagnosis present

## 2024-01-19 DIAGNOSIS — M6281 Muscle weakness (generalized): Secondary | ICD-10-CM

## 2024-01-19 DIAGNOSIS — Z91040 Latex allergy status: Secondary | ICD-10-CM

## 2024-01-19 DIAGNOSIS — R195 Other fecal abnormalities: Secondary | ICD-10-CM | POA: Diagnosis present

## 2024-01-19 DIAGNOSIS — Z79899 Other long term (current) drug therapy: Secondary | ICD-10-CM

## 2024-01-19 DIAGNOSIS — Z7982 Long term (current) use of aspirin: Secondary | ICD-10-CM

## 2024-01-19 DIAGNOSIS — N319 Neuromuscular dysfunction of bladder, unspecified: Secondary | ICD-10-CM

## 2024-01-19 DIAGNOSIS — Z91013 Allergy to seafood: Secondary | ICD-10-CM

## 2024-01-19 DIAGNOSIS — M4644 Discitis, unspecified, thoracic region: Secondary | ICD-10-CM | POA: Diagnosis present

## 2024-01-19 DIAGNOSIS — I10 Essential (primary) hypertension: Secondary | ICD-10-CM | POA: Diagnosis present

## 2024-01-19 DIAGNOSIS — Z9102 Food additives allergy status: Secondary | ICD-10-CM

## 2024-01-19 DIAGNOSIS — M797 Fibromyalgia: Secondary | ICD-10-CM | POA: Diagnosis present

## 2024-01-19 DIAGNOSIS — G629 Polyneuropathy, unspecified: Secondary | ICD-10-CM

## 2024-01-19 DIAGNOSIS — E876 Hypokalemia: Secondary | ICD-10-CM | POA: Diagnosis present

## 2024-01-19 DIAGNOSIS — Z882 Allergy status to sulfonamides status: Secondary | ICD-10-CM

## 2024-01-19 DIAGNOSIS — Z981 Arthrodesis status: Secondary | ICD-10-CM | POA: Diagnosis not present

## 2024-01-19 DIAGNOSIS — J302 Other seasonal allergic rhinitis: Secondary | ICD-10-CM | POA: Diagnosis present

## 2024-01-19 DIAGNOSIS — Z91041 Radiographic dye allergy status: Secondary | ICD-10-CM

## 2024-01-19 DIAGNOSIS — L299 Pruritus, unspecified: Secondary | ICD-10-CM | POA: Diagnosis not present

## 2024-01-19 MED ORDER — VITAMIN D 25 MCG (1000 UNIT) PO TABS
1000.0000 [IU] | ORAL_TABLET | Freq: Every day | ORAL | Status: DC
  Administered 2024-01-20 – 2024-02-02 (×14): 1000 [IU] via ORAL
  Filled 2024-01-19 (×14): qty 1

## 2024-01-19 MED ORDER — ASPIRIN 81 MG PO TBEC
81.0000 mg | DELAYED_RELEASE_TABLET | Freq: Every morning | ORAL | Status: DC
  Administered 2024-01-20 – 2024-02-02 (×14): 81 mg via ORAL
  Filled 2024-01-19 (×15): qty 1

## 2024-01-19 MED ORDER — BISACODYL 10 MG RE SUPP: 10.0000 mg | Freq: Every day | RECTAL | Status: DC | PRN

## 2024-01-19 MED ORDER — SODIUM CHLORIDE 0.9 % IV SOLN
2.0000 g | Freq: Three times a day (TID) | INTRAVENOUS | Status: DC
  Administered 2024-01-19 – 2024-02-02 (×37): 2 g via INTRAVENOUS
  Filled 2024-01-19 (×41): qty 12.5

## 2024-01-19 MED ORDER — ACETAMINOPHEN 325 MG PO TABS
325.0000 mg | ORAL_TABLET | ORAL | Status: DC | PRN
  Administered 2024-01-19 – 2024-01-30 (×12): 650 mg via ORAL
  Filled 2024-01-19 (×13): qty 2

## 2024-01-19 MED ORDER — ENSURE ENLIVE PO LIQD
237.0000 mL | Freq: Two times a day (BID) | ORAL | Status: DC
  Administered 2024-01-20 – 2024-02-01 (×18): 237 mL via ORAL
  Filled 2024-01-19: qty 237

## 2024-01-19 MED ORDER — ADULT MULTIVITAMIN W/MINERALS CH
1.0000 | ORAL_TABLET | Freq: Every day | ORAL | Status: DC
  Administered 2024-01-20 – 2024-02-01 (×13): 1 via ORAL
  Filled 2024-01-19 (×13): qty 1

## 2024-01-19 MED ORDER — DIPHENHYDRAMINE HCL 25 MG PO CAPS
25.0000 mg | ORAL_CAPSULE | Freq: Four times a day (QID) | ORAL | Status: DC | PRN
  Administered 2024-01-20 – 2024-02-02 (×27): 25 mg via ORAL
  Filled 2024-01-19 (×28): qty 1

## 2024-01-19 MED ORDER — OXYCODONE HCL 5 MG PO TABS
10.0000 mg | ORAL_TABLET | ORAL | Status: DC | PRN
  Administered 2024-01-19 – 2024-02-02 (×25): 10 mg via ORAL
  Filled 2024-01-19 (×27): qty 2

## 2024-01-19 MED ORDER — MENTHOL 3 MG MT LOZG: 1.0000 | LOZENGE | OROMUCOSAL | Status: DC | PRN

## 2024-01-19 MED ORDER — OXYCODONE HCL ER 10 MG PO T12A
10.0000 mg | EXTENDED_RELEASE_TABLET | Freq: Two times a day (BID) | ORAL | Status: DC
  Administered 2024-01-19 – 2024-01-31 (×24): 10 mg via ORAL
  Filled 2024-01-19 (×25): qty 1

## 2024-01-19 MED ORDER — VITAMIN C 500 MG PO TABS
500.0000 mg | ORAL_TABLET | Freq: Every morning | ORAL | Status: DC
  Administered 2024-01-20 – 2024-02-02 (×14): 500 mg via ORAL
  Filled 2024-01-19 (×14): qty 1

## 2024-01-19 MED ORDER — DIPHENHYDRAMINE HCL 25 MG PO CAPS: 25.0000 mg | ORAL_CAPSULE | Freq: Three times a day (TID) | ORAL | Status: DC | PRN

## 2024-01-19 MED ORDER — LIDOCAINE HCL URETHRAL/MUCOSAL 2 % EX GEL: CUTANEOUS | Status: DC | PRN

## 2024-01-19 MED ORDER — FLEET ENEMA RE ENEM: 1.0000 | ENEMA | Freq: Once | RECTAL | Status: DC | PRN

## 2024-01-19 MED ORDER — ATENOLOL 25 MG PO TABS
50.0000 mg | ORAL_TABLET | Freq: Every day | ORAL | Status: DC
  Administered 2024-01-20 – 2024-02-02 (×14): 50 mg via ORAL
  Filled 2024-01-19 (×14): qty 2

## 2024-01-19 MED ORDER — ALUM & MAG HYDROXIDE-SIMETH 200-200-20 MG/5ML PO SUSP
30.0000 mL | ORAL | Status: DC | PRN
  Administered 2024-01-20: 30 mL via ORAL
  Filled 2024-01-19: qty 30

## 2024-01-19 MED ORDER — PROCHLORPERAZINE 25 MG RE SUPP: 12.5000 mg | Freq: Four times a day (QID) | RECTAL | Status: DC | PRN

## 2024-01-19 MED ORDER — FERROUS SULFATE 325 (65 FE) MG PO TABS: 325.0000 mg | ORAL_TABLET | Freq: Every morning | ORAL | Status: DC

## 2024-01-19 MED ORDER — AMLODIPINE BESYLATE 5 MG PO TABS
5.0000 mg | ORAL_TABLET | Freq: Every morning | ORAL | Status: DC
  Administered 2024-01-20 – 2024-01-27 (×8): 5 mg via ORAL
  Filled 2024-01-19 (×8): qty 1

## 2024-01-19 MED ORDER — ENOXAPARIN SODIUM 40 MG/0.4ML IJ SOSY
40.0000 mg | PREFILLED_SYRINGE | INTRAMUSCULAR | Status: DC
  Administered 2024-01-19 – 2024-02-01 (×14): 40 mg via SUBCUTANEOUS
  Filled 2024-01-19 (×14): qty 0.4

## 2024-01-19 MED ORDER — VITAMIN B-12 1000 MCG PO TABS
1000.0000 ug | ORAL_TABLET | ORAL | Status: DC
  Administered 2024-01-23 – 2024-01-30 (×2): 1000 ug via ORAL
  Filled 2024-01-19 (×2): qty 1

## 2024-01-19 MED ORDER — SODIUM CHLORIDE 0.9% FLUSH: 10.0000 mL | INTRAVENOUS | Status: DC | PRN

## 2024-01-19 MED ORDER — DIAZEPAM 5 MG PO TABS
5.0000 mg | ORAL_TABLET | Freq: Every day | ORAL | Status: DC
  Administered 2024-01-19 – 2024-02-01 (×14): 5 mg via ORAL
  Filled 2024-01-19 (×14): qty 1

## 2024-01-19 MED ORDER — POTASSIUM CHLORIDE CRYS ER 20 MEQ PO TBCR
20.0000 meq | EXTENDED_RELEASE_TABLET | Freq: Every day | ORAL | Status: DC
  Administered 2024-01-19 – 2024-01-30 (×12): 20 meq via ORAL
  Filled 2024-01-19 (×12): qty 1

## 2024-01-19 MED ORDER — ALBUTEROL SULFATE (2.5 MG/3ML) 0.083% IN NEBU: 2.5000 mg | INHALATION_SOLUTION | Freq: Four times a day (QID) | RESPIRATORY_TRACT | Status: DC | PRN

## 2024-01-19 MED ORDER — GUAIFENESIN-DM 100-10 MG/5ML PO SYRP: 5.0000 mL | ORAL_SOLUTION | Freq: Four times a day (QID) | ORAL | Status: DC | PRN

## 2024-01-19 MED ORDER — MELATONIN 5 MG PO TABS
5.0000 mg | ORAL_TABLET | Freq: Every evening | ORAL | Status: DC | PRN
  Filled 2024-01-19: qty 1

## 2024-01-19 MED ORDER — LUBIPROSTONE 24 MCG PO CAPS
48.0000 ug | ORAL_CAPSULE | Freq: Every day | ORAL | Status: DC
  Administered 2024-01-20 – 2024-02-01 (×13): 48 ug via ORAL
  Filled 2024-01-19 (×14): qty 2

## 2024-01-19 MED ORDER — CHLORHEXIDINE GLUCONATE CLOTH 2 % EX PADS
6.0000 | MEDICATED_PAD | Freq: Every day | CUTANEOUS | Status: DC
  Administered 2024-01-20: 6 via TOPICAL

## 2024-01-19 MED ORDER — PANTOPRAZOLE SODIUM 40 MG PO TBEC
40.0000 mg | DELAYED_RELEASE_TABLET | Freq: Every day | ORAL | Status: DC
  Administered 2024-01-20 – 2024-02-02 (×14): 40 mg via ORAL
  Filled 2024-01-19 (×14): qty 1

## 2024-01-19 MED ORDER — SENNA 8.6 MG PO TABS
2.0000 | ORAL_TABLET | Freq: Every day | ORAL | Status: DC
  Administered 2024-01-20 – 2024-01-21 (×2): 17.2 mg via ORAL
  Filled 2024-01-19 (×2): qty 2

## 2024-01-19 MED ORDER — HYDROCERIN EX CREA
TOPICAL_CREAM | Freq: Two times a day (BID) | CUTANEOUS | Status: DC
  Administered 2024-01-24: 1 via TOPICAL
  Filled 2024-01-19: qty 113

## 2024-01-19 MED ORDER — SODIUM CHLORIDE 0.9% FLUSH
10.0000 mL | Freq: Two times a day (BID) | INTRAVENOUS | Status: DC
  Administered 2024-01-20 – 2024-02-02 (×19): 10 mL

## 2024-01-19 MED ORDER — CHLORTHALIDONE 25 MG PO TABS
25.0000 mg | ORAL_TABLET | Freq: Every day | ORAL | Status: DC
  Administered 2024-01-20 – 2024-02-02 (×14): 25 mg via ORAL
  Filled 2024-01-19 (×14): qty 1

## 2024-01-19 MED ORDER — POTASSIUM CHLORIDE CRYS ER 20 MEQ PO TBCR: 20.0000 meq | EXTENDED_RELEASE_TABLET | Freq: Two times a day (BID) | ORAL | Status: DC

## 2024-01-19 MED ORDER — PROCHLORPERAZINE MALEATE 5 MG PO TABS
5.0000 mg | ORAL_TABLET | Freq: Four times a day (QID) | ORAL | Status: DC | PRN
  Administered 2024-01-27 – 2024-01-28 (×2): 10 mg via ORAL
  Filled 2024-01-19 (×2): qty 2

## 2024-01-19 MED ORDER — GABAPENTIN 400 MG PO CAPS
1200.0000 mg | ORAL_CAPSULE | Freq: Three times a day (TID) | ORAL | Status: DC
  Administered 2024-01-19 – 2024-02-02 (×41): 1200 mg via ORAL
  Filled 2024-01-19 (×41): qty 3

## 2024-01-19 MED ORDER — PROCHLORPERAZINE EDISYLATE 10 MG/2ML IJ SOLN: 5.0000 mg | Freq: Four times a day (QID) | INTRAMUSCULAR | Status: DC | PRN

## 2024-01-19 MED ORDER — METHOCARBAMOL 500 MG PO TABS
500.0000 mg | ORAL_TABLET | Freq: Four times a day (QID) | ORAL | Status: DC | PRN
  Administered 2024-01-19 – 2024-02-01 (×15): 500 mg via ORAL
  Filled 2024-01-19 (×16): qty 1

## 2024-01-19 MED ORDER — PHENOL 1.4 % MT LIQD: 1.0000 | OROMUCOSAL | Status: DC | PRN

## 2024-01-19 MED ORDER — OXYCODONE HCL 5 MG PO TABS: 5.0000 mg | ORAL_TABLET | ORAL | Status: DC | PRN

## 2024-01-19 MED ORDER — CAMPHOR-MENTHOL 0.5-0.5 % EX LOTN: TOPICAL_LOTION | CUTANEOUS | Status: DC | PRN

## 2024-01-19 MED ORDER — CHLORZOXAZONE 500 MG PO TABS
500.0000 mg | ORAL_TABLET | Freq: Four times a day (QID) | ORAL | Status: DC
  Administered 2024-01-19 – 2024-02-02 (×54): 500 mg via ORAL
  Filled 2024-01-19 (×57): qty 1

## 2024-01-19 NOTE — H&P (Signed)
 Physical Medicine and Rehabilitation Admission H&P    CC: Functional deficits.    HPI: [name] is a 63 year old female with history of asthma, Anxiety d/o, chronic LBP, depression, Fibromyalgia, HTN,  obesity, MSSA hardware associated lumbar wound infection treated with IV antibiotics and antibiotic suppression 2021, thoracic wound drainage due to enterobacter and underwent lumbar wound debridement and repair of CSF leak 03/2022 delayed wound healing and prolonged course of antibiotics followed by Dr. Franky Macho and Dr. Eligah East. She was found to have T9 discitis/osteomyelitis with epidural abscess/phlegmon and BLE weakness/numbness on Jan 2025 admission. She was discharged to home on IV cefepime but had issues with occluded PICC requiring readmission 02/19-02/27 and with plans for extended antibiotics thru 01/05/24 followed by oral suppression. She was admitted antibiotics thru 12/30/23 with dislodged PICC, found to have severe hypokalemia K-2.7 which was supplemented and she was d/c to home with antibiotics extended to 01/19/24.   She was admitted on 01/08/24 for laminectomy with decompression of spinal stenosis T9-T10 by Dr Franky Macho.   Wound VAC to be removed today. ID recommends IV Cefepime while hospitalized followed by Cefadroxil/Cipro for at least a year. She lives alone and has limited mobility since Jan 2025 but was independent with cane prior to that. Neighbors/family  have been providing meals for couple of weeks PTA.  PT/OT has been working with patient who requires min to mod assist with mobility and min assist with ADLs. CIR recommended due to functional decline.    Review of Systems  Constitutional:  Negative for chills and fever.  HENT:  Negative for hearing loss and tinnitus.   Respiratory:  Negative for shortness of breath.        Tightness recently  Cardiovascular:  Negative for chest pain and palpitations.  Gastrointestinal:  Positive for heartburn and nausea (with  liquid antibiotic that she was using at home). Negative for abdominal pain and vomiting.  Musculoskeletal:  Positive for back pain and myalgias.  Skin:  Negative for rash.  Neurological:  Positive for sensory change (numbnes BLE and lower abdominal area.) and headaches (uses her narcotic as does not have tylenol).  Psychiatric/Behavioral:  The patient does not have insomnia.      Past Medical History:  Diagnosis Date   Anemia    Ankle syndesmosis disruption    Anxiety    takes Ativan and Valium, after mother passed   Arthritis    bilateral knees s/p knee replacement bilaterally   Asthma    Bronchitis    Bruising    pt states unexplained d/t fibromyalgia   Chronic back pain    2012 tailbone surgery and 3 lower discs.    Closed fracture of distal end of right fibula and tibia    Depression    from Fibromyalgia diagnosis; not taking medicine. since 2001   Dizziness    rarely   Fibromyalgia    diagnosed 2001   GERD (gastroesophageal reflux disease)    Prilosec occasionally   Headache(784.0)    "sinus headaches"   History of hiatal hernia    Hypertension    since 2013   Hyperthyroidism    subclinical, no treatment; thyroid nodules   IBS (irritable bowel syndrome)    Impaired memory    states from fibromyalgia   Insomnia    takes Ambien   Jones fracture    left foot fifth metatarsal   Multiple allergies    including latex, pet dander, shellfish, pet dander   Painful orthopaedic  hardware right ankle 05/27/2016   Seasonal allergies    Shortness of breath    Occasional with exertion;    Sore gums    this is why pt is on Amoxil-only takes for dental work   Tachycardia    Thyroid goiter    Varicose vein    protrudes above skin-per pt;vein popped and bruised;ultrasound done to make sure that there were no clots;noclots were found    Past Surgical History:  Procedure Laterality Date   ANTERIOR CERVICAL DECOMP/DISCECTOMY FUSION N/A 12/08/2020   Procedure: Cervical  Four-Five Anterior cervical decompression/discectomy/fusion;  Surgeon: Audie Bleacher, MD;  Location: [facility] OR;  Service: Neurosurgery;  Laterality: N/A;  anterior   ANTERIOR LUMBAR FUSION  09/20/2011   Procedure: ANTERIOR LUMBAR FUSION 1 LEVEL;  Surgeon: Pasty Bongo;  Location: [facility];  Service: Neurosurgery;  Laterality: N/A;  Lumbar five-Sacral One Anterior Lumbar Interbody Fusion /Dr. Early to Approach    APPLICATION OF WOUND VAC N/A 05/14/2022   Procedure: Removal of wound vac with closure of wound;  Surgeon: Audie Bleacher, MD;  Location: [facility] OR;  Service: Neurosurgery;  Laterality: N/A;  Pt to be admitted on 05-12-2022   CERVICAL DISC SURGERY     WITH TITANIUM PLATE IN NECK---LEFT SIDE   CERVICAL SPINE SURGERY  03/24/2019   CESAREAN SECTION     EYE SURGERY Bilateral 2023   cataract   fibroidectomy     FRACTURE SURGERY  05/08/2010   Jones fracture left foot fifth metatarsal   HARDWARE REMOVAL Right 05/28/2016   Procedure: RIGHT ANKLE HARDWARE REMOVAL;  Surgeon: Elly Habermann, MD;  Location: [facility];  Service: Orthopedics;  Laterality: Right;   HARDWARE REMOVAL N/A 07/28/2020   Procedure: Removal of Lumbar Hardware;  Surgeon: Audie Bleacher, MD;  Location: [facility] OR;  Service: Neurosurgery;  Laterality: N/A;   HERNIA REPAIR     hiatal hernia   IR THORACIC DISC ASPIRATION W/IMG GUIDE  10/29/2023   IRRIGATION AND DEBRIDEMENT KNEE  04/09/2012   Procedure: IRRIGATION AND DEBRIDEMENT KNEE;  Surgeon: Forbes Ida., MD;  Location: [facility] OR;  Service: Orthopedics;  Laterality: Right;   JOINT REPLACEMENT  01/27/2012   left total knee and Right total knee   KNEE SURGERY  2005   Left knee arthroscopy   LAMINECTOMY WITH POSTERIOR LATERAL ARTHRODESIS LEVEL 4 Bilateral 03/22/2022   Procedure: Thoracic ten to Lumbar three Posterior lateral arthrodesis with screws;  Surgeon: Audie Bleacher, MD;  Location: [facility] OR;  Service: Neurosurgery;  Laterality: Bilateral;   LUMBAR WOUND DEBRIDEMENT  N/A 04/08/2022   Procedure: LUMBAR WOUND DEBRIDEMENT;  Surgeon: Audie Bleacher, MD;  Location: [facility] OR;  Service: Neurosurgery;  Laterality: N/A;   NASAL SEPTOPLASTY W/ TURBINOPLASTY  2007   due to recurrent sinusitis   ORIF ANKLE FRACTURE Right 06/21/2015   Procedure: OPEN REDUCTION INTERNAL FIXATION RIGHT DISTAL FIBULA  FRACTURE AND OPEN REDUCTION INTERNAL FIXATION SYNDESMOSIS ;  Surgeon: Elly Habermann, MD;  Location:  SURGERY CENTER;  Service: Orthopedics;  Laterality: Right;   PARTIAL HYSTERECTOMY     SHOULDER ARTHROSCOPY W/ ROTATOR CUFF REPAIR Right    SHOULDER SURGERY Left    SPINE SURGERY  2004   Cervical plate, ACDF   STERIOD INJECTION  01/27/2012   Procedure: STEROID INJECTION;  Surgeon: Genevie Kerns, MD;  Location: [facility] OR;  Service: Orthopedics;  Laterality: Right;   TEE WITHOUT CARDIOVERSION N/A 03/01/2020   Procedure: TRANSESOPHAGEAL ECHOCARDIOGRAM (TEE);  Surgeon: Hugh Madura, MD;  Location:  [facility];  Service: Cardiovascular;  Laterality: N/A;   THORACIC DISCECTOMY  02/16/2020   Procedure: Thoracic ten-eleven Discectomy;  Surgeon: Audie Bleacher, MD;  Location: [facility] OR;  Service: Neurosurgery;;   THORACIC DISCECTOMY N/A 01/08/2024   Procedure: THORACIC NINE-TEN LAMINECTOMY AND DECOMPRESSION;  Surgeon: Audie Bleacher, MD;  Location: [facility] OR;  Service: Neurosurgery;  Laterality: N/A;   TONSILLECTOMY  2007   TOTAL KNEE ARTHROPLASTY  01/27/2012   Procedure: TOTAL KNEE ARTHROPLASTY;  Surgeon: Genevie Kerns, MD;  Bilateral   TOTAL KNEE ARTHROPLASTY  04/06/2012   Procedure: TOTAL KNEE ARTHROPLASTY;  Surgeon: Genevie Kerns, MD;  Location: [facility] OR;  Service: Orthopedics;  Laterality: Right;   TUBAL LIGATION     UTERINE FIBROID SURGERY     mid 200s   WOUND EXPLORATION N/A 02/27/2020   Procedure: WOUND EXPLORATION;  Surgeon: Audie Bleacher, MD;  Location: [facility] OR;  Service: Neurosurgery;  Laterality: N/A;,  (wound vac upper back)   WOUND EXPLORATION N/A 03/30/2022   Procedure:  THORACIC WOUND EXPLORATION WITH REPAIR OF CSF LEAK;  Surgeon: Audie Bleacher, MD;  Location: [facility] OR;  Service: Neurosurgery;  Laterality: N/A;    Family History  Problem Relation Age of Onset   Stroke Father        passed 2004 from pneumonia   Alcohol abuse Father    Pulmonary embolism Mother        after minor knee surgery leading to DVT   Arthritis Sister    Depression Sister    Hypertension Sister    Diabetes Other        grandmother   Anesthesia problems Neg Hx    Hypotension Neg Hx    Malignant hyperthermia Neg Hx    Pseudochol deficiency Neg Hx     Social History:  Lives alone and has been non ambulatory since 10/2023 and family/friends assisting since d/c from SNF. Son lives in [place].  She  reports that she quit smoking about 25 years ago. Her smoking use included cigarettes. She started smoking about 35 years ago. She has a 5 pack-year smoking history. She has never used smokeless tobacco. She reports that she does not drink alcohol and does not use drugs.    Allergies  Allergen Reactions   Monosodium Glutamate Anaphylaxis and Swelling    Eyes swollen shut, facial swelling, tongue swelling.   Shellfish Allergy Anaphylaxis   Baclofen Nausea Only    Dizziness and increase muscle spasm   Celebrex [Celecoxib] Itching    Only allergic to generic brand   Contrast Media [Iodinated Contrast Media] Itching and Nausea Only    "could not walk"   Diclofenac Itching    Generic Diclefenac gel causes itching. Can take the name brand Voltaren gel   Gadolinium     Allergic to MRI contrast dye per patient.   Other Other (See Comments)    Pet dander,        Latex Itching and Rash    Latex glove with powder; bite blocks used for EGD studies    Medications Prior to Admission  Medication Sig Dispense Refill   Acetaminophen (TYLENOL PO) Take 1 tablet by mouth as needed.     amLODipine (NORVASC) 5 MG tablet Take 1 tablet (5 mg total) by mouth daily. (Patient taking differently: Take 5  mg by mouth in the morning.)     ascorbic acid (VITAMIN C) 500 MG tablet Take 500 mg by mouth in the morning.     aspirin EC 81 MG tablet  Take 81 mg by mouth in the morning.     atenolol (TENORMIN) 50 MG tablet Take 1 tablet (50 mg total) by mouth daily. (Patient taking differently: Take 50 mg by mouth in the morning.) 30 tablet 0   chlorthalidone (HYGROTON) 25 MG tablet Take 1 tablet (25 mg total) by mouth daily. (Patient taking differently: Take 25 mg by mouth in the morning.)     chlorzoxazone (PARAFON) 500 MG tablet Take 1 tablet (500 mg total) by mouth 3 (three) times daily. (Patient taking differently: Take 500 mg by mouth 4 (four) times daily.) 90 tablet 0   Cholecalciferol (VITAMIN D-3 PO) Take 1 tablet by mouth in the morning.     cyanocobalamin (VITAMIN B12) 1000 MCG tablet Take 1,000 mcg by mouth once a week.     diazepam (VALIUM) 5 MG tablet Take 1 tablet (5 mg total) by mouth 2 (two) times daily. (Patient taking differently: Take 5 mg by mouth See admin instructions. Take 5 mg at bedtime and additional 5 mg if needed for anxiety) 30 tablet 0   diclofenac Sodium (VOLTAREN) 1 % GEL Apply 2 g topically 4 (four) times daily as needed (pain).     diphenhydrAMINE (BENADRYL) 25 MG tablet Take 50 mg by mouth in the morning, at noon, in the evening, and at bedtime.     ferrous sulfate 325 (65 FE) MG EC tablet Take 325 mg by mouth in the morning.     gabapentin (NEURONTIN) 600 MG tablet Take 2 tablets (1,200 mg total) by mouth 3 (three) times daily. 180 tablet 0   lubiprostone (AMITIZA) 24 MCG capsule Take 1 capsule (24 mcg total) by mouth 2 (two) times daily with a meal. (Patient taking differently: Take 48 mcg by mouth daily before lunch.) 60 capsule 0   methocarbamol (ROBAXIN) 500 MG tablet Take 500 mg by mouth 4 (four) times daily.     Multiple Minerals-Vitamins (CALCIUM-MAGNESIUM-ZINC-D3 PO) Take 1 tablet by mouth in the morning, at noon, and at bedtime.     omeprazole (PRILOSEC) 20 MG  capsule Take 20 mg by mouth in the morning and at bedtime.      ondansetron (ZOFRAN-ODT) 4 MG disintegrating tablet Take 4 mg by mouth every 6 (six) hours as needed.     oxyCODONE-acetaminophen (PERCOCET) 10-325 MG tablet Take 1 tablet by mouth every 6 (six) hours as needed for pain. 20 tablet 0   albuterol (VENTOLIN HFA) 108 (90 Base) MCG/ACT inhaler Inhale 2 puffs into the lungs every 6 (six) hours as needed for wheezing or shortness of breath. 18 g 0   ceFEPIme 2 g in sodium chloride 0.9 % 100 mL Inject 2 g into the vein every 8 (eight) hours.     celecoxib (CELEBREX) 200 MG capsule Take 200 mg by mouth 2 (two) times daily.     NARCAN 4 MG/0.1ML LIQD nasal spray kit Place 1 spray into the nose once. (Patient taking differently: Place 1 spray into the nose once as needed (opioid overdose).)       Home: Home Living Family/patient expects to be discharged to:: Private residence Living Arrangements: Alone Available Help at Discharge: Family, Friend(s), Available PRN/intermittently Type of Home: House Home Access: Stairs to enter [employer] of Steps: 5 Entrance Stairs-Rails: Right, Left, Can reach both Home Layout: One level Bathroom Shower/Tub: Engineer, manufacturing systems: Standard Home Equipment: Information systems manager, Higher education careers adviser held shower head, Agricultural consultant (2 wheels), Rollator (4 wheels), Grab bars - tub/shower, Cane - single point, Adaptive equipment,  BSC/3in1, Crutches, Tub bench Adaptive Equipment: Reacher, Long-handled sponge   Functional History: Prior Function Prior Level of Function : History of Falls (last six months), Driving, Needs assist Mobility Comments: Mod I with rollator, cane or crutches during prior admission; but endorses frequent falls. pt reports limited OOB mobility since DC home from SNF (reports therapy only worked on standing and stepping at bedside, not walking so pt unable to walk when discharged home) ADLs Comments: Unclear on ADL mgmt since DC from SNF.  pt reports due to weakness, was using [facility] for toileting and neighbors and family bringing meals to pt.  Functional Status:  Mobility: Bed Mobility Overal bed mobility: Needs Assistance Bed Mobility: Rolling, Sidelying to Sit Rolling: Contact guard assist, Used rails Sidelying to sit: HOB elevated, Used rails, Contact guard assist Sit to sidelying: Mod assist General bed mobility comments: cues for maintaining spinal precautions, pt able to verbalize "BLT" without cues. no Transfers Overall transfer level: Needs assistance Equipment used: Rolling walker (2 wheels) Transfers: Sit to/from Stand, Bed to chair/wheelchair/BSC Sit to Stand: Min assist, From elevated surface Bed to/from chair/wheelchair/BSC transfer type:: Step pivot Step pivot transfers: Min assist General transfer comment: cues for back precautions, cues for bil UE use to power up from armrest of chair and slighlty elevated EOB. Ambulation/Gait Ambulation/Gait assistance: Min assist, Mod assist Gait Distance (Feet): 6 Feet (2x6', 2x3') Assistive device: Rolling walker (2 wheels) Gait Pattern/deviations: Step-to pattern, Decreased stride length, Decreased dorsiflexion - right, Trunk flexed, Decreased weight shift to right, Knee hyperextension - right, Narrow base of support General Gait Details: pt advanceing Rt LE with poor dorsiflexion noted. As pt fatigued greater difficulty noetd to lift and prevent adduction, mod assist provided for placement and stability in stance. Gait velocity: decreased Gait velocity interpretation: <1.31 ft/sec, indicative of household ambulator    ADL: ADL Overall ADL's : Needs assistance/impaired Eating/Feeding: Set up, Sitting Grooming: Wash/dry face, Oral care, Set up, Sitting Upper Body Bathing: Minimal assistance, Sitting Lower Body Bathing: Maximal assistance, Sitting/lateral leans, Sit to/from stand Upper Body Dressing : Minimal assistance, Sitting Lower Body Dressing: Minimal  assistance Lower Body Dressing Details (indicate cue type and reason): Pt verbalized using a cane at home to don pants, educated on use of reacher and first in last out Toilet Transfer: Minimal assistance, Ambulation, Rolling walker (2 wheels) Toilet Transfer Details (indicate cue type and reason): Simulated in room 3 steps towards recliner Toileting- Clothing Manipulation and Hygiene: Contact guard assist, Sit to/from stand, Cueing for compensatory techniques, Cueing for back precautions Toileting - Clothing Manipulation Details (indicate cue type and reason): Verbal and tacile cues used to maintainin back precautions Functional mobility during ADLs: Minimal assistance, Rolling walker (2 wheels), Cueing for sequencing General ADL Comments: Difficulty maintaing back precautions during ADLs  Cognition: Cognition Orientation Level: Oriented X4 Cognition Arousal: Alert Behavior During Therapy: WFL for tasks assessed/performed   Blood pressure (!) 165/78, pulse (!) 58, temperature 98.6 F (37 C), resp. rate 18, height 5\' 2"  (1.575 m), weight 94.8 kg, last menstrual period 11/20/2012, SpO2 99%. Physical Exam Vitals and nursing note reviewed.  Constitutional:      Appearance: Normal appearance.  HENT:     Head: Normocephalic.     Right Ear: External ear normal.     Left Ear: External ear normal.     Mouth/Throat:     Pharynx: Oropharynx is clear.  Eyes:     Extraocular Movements: Extraocular movements intact.     Conjunctiva/sclera: Conjunctivae normal.  Pupils: Pupils are equal, round, and reactive to light.  Cardiovascular:     Rate and Rhythm: Normal rate and regular rhythm.     Heart sounds: No murmur heard.    No gallop.  Pulmonary:     Effort: Pulmonary effort is normal. No respiratory distress.     Breath sounds: No wheezing.  Abdominal:     General: Bowel sounds are normal. There is no distension.  Musculoskeletal:        General: Tenderness (right ankle with limited  ROM d/t prior injury) and deformity present.     Cervical back: Normal range of motion.  Skin:    General: Skin is warm.     Comments: Back incision dressed  Neurological:     Mental Status: She is alert.     Comments: Alert and oriented x 3. Normal insight and awareness. Intact Memory. Normal language and speech. Cranial nerve exam unremarkable. MMT: RUE 4+ to 5/5 prox to distal with sl limitations due to DJD of right shoulder, prior RTC injury. LUE 4/5 to 4+/5 prox to distal with more limitation due to prior left shoulder/RTC injury. RLE 2/5 HF, KE 2/5 APF and 1+ ADF with lmitations due to ankle. LLE 3-/5 HF, KE and 3 to 3+ ADF/APF. Decreased LT R>L from below the umbilicus. Scattered sensory loss in arms which patient states his "chronic" due to "neuropathy".  DTR's 1+ in LE's. No appreciable resting tone.     Psychiatric:        Mood and Affect: Mood normal.        Behavior: Behavior normal.     No results found for this or any previous visit (from the past 48 hours). No results found.    Blood pressure (!) 165/78, pulse (!) 58, temperature 98.6 F (37 C), resp. rate 18, height 5\' 2"  (1.575 m), weight 94.8 kg, last menstrual period 11/20/2012, SpO2 99%.  Medical Problem List and Plan: 1. Functional deficits secondary to T9/10 myelopathy d/t severe spinal stenosis  and hx of T9 discitis/osteomyelitis  -patient may  shower  -ELOS/Goals: 14-21 days, goals are mod I to supervision/min assist with PT and OT 2.  Antithrombotics: -DVT/anticoagulation:  Pharmaceutical: Lovenox  -antiplatelet therapy: N/A 3. Chronic Pain/Pain Management: Followed by [facility] [address]/Dr. Wynelle Heather [facility]) --Gabapentin 1200 mg TID. Tylenol prn.Parafon 500 mg qid.   --oxycodone prn. Oxycontin 10 mg bid. 4. Mood/Behavior/Sleep: LCSW to follow for evaluation and support.   -antipsychotic agents: N/A.   --Has insomnia does not use CPAP "forgets to use it"/Uses valium for insomnia? Panic  attacks? --Melatonin prn added 5. Neuropsych/cognition: This patient is capable of making decisions on her own behalf. 6. Skin/Wound Care: will apply dry dressing to wound for now. 7. Fluids/Electrolytes/Nutrition: Monitor I/O.    --Vitamin supplement and prostat to help promote wound healing.  8. Enterobacter/MSSA Lumbar infection: Continue Cefepime while hospitalized and transition to Cipro 500 mg bid/Cefadroxil 1 gram bid at discharge.  --benadryl 50 mg tid prn allergies-->has been using couple of times a dayu  --follow up with Dr. Zelda Hickman 9. Recurrent Hypokalemia: Last check  04/10 showed K-3.0 and not supplemented.   --supplement and check in AM  10.H/o IBS- C: Was not taking iron at home--will d/c.   --Black stools due to iron supplement.  -- Continue Amitiza and order stool guaiacs.  -there is also likely a component of neurogenic bowel as patient was having incontinence PTA 11. HTN: Monitor BP TID --on Amlodipine, tenormin and  Chlorthalidone. --likely need potassium supplement due to chlorthalidone.   12. Bowel incontinence: Reports incontinence during sleep for past 2 years.  13. Neurogenic Bladder/ incontinence: Has been incontinent of bladder at nights and during the day.  --Will monitor voiding with PVR checks.  14. OSA: Uses CPAP at home--will resume.       Zelda Hickman, PA-C 01/19/2024

## 2024-01-19 NOTE — Evaluation (Signed)
 Occupational Therapy Assessment and Plan  Patient Details  Name: [name] MRN: [id number] Date of Birth: 01/17/1960  OT Diagnosis: {diagnoses:3041644} Rehab Potential:   ELOS:     {CHL IP REHAB OT TIME CALCULATIONS:304400400}    Hospital Problem: Principal Problem:   Thoracic discitis Active Problems:   Thoracic myelopathy   Past Medical History:  Past Medical History:  Diagnosis Date   Anemia    Ankle syndesmosis disruption    Anxiety    takes Ativan and Valium, after mother passed   Arthritis    bilateral knees s/p knee replacement bilaterally   Asthma    Bronchitis    Bruising    pt states unexplained d/t fibromyalgia   Chronic back pain    2012 tailbone surgery and 3 lower discs.    Closed fracture of distal end of right fibula and tibia    Depression    from Fibromyalgia diagnosis; not taking medicine. since 2001   Dizziness    rarely   Fibromyalgia    diagnosed 2001   GERD (gastroesophageal reflux disease)    Prilosec occasionally   Headache(784.0)    "sinus headaches"   History of hiatal hernia    Hypertension    since 2013   Hyperthyroidism    subclinical, no treatment; thyroid nodules   IBS (irritable bowel syndrome)    Impaired memory    states from fibromyalgia   Insomnia    takes Ambien   Jones fracture    left foot fifth metatarsal   Multiple allergies    including latex, pet dander, shellfish, pet dander   Painful orthopaedic hardware right ankle 05/27/2016   Seasonal allergies    Shortness of breath    Occasional with exertion;    Sore gums    this is why pt is on Amoxil-only takes for dental work   Tachycardia    Thyroid goiter    Varicose vein    protrudes above skin-per pt;vein popped and bruised;ultrasound done to make sure that there were no clots;noclots were found   Past Surgical History:  Past Surgical History:  Procedure Laterality Date   ANTERIOR CERVICAL DECOMP/DISCECTOMY FUSION N/A 12/08/2020   Procedure:  Cervical Four-Five Anterior cervical decompression/discectomy/fusion;  Surgeon: Coletta Memos, MD;  Location: [facility] OR;  Service: Neurosurgery;  Laterality: N/A;  anterior   ANTERIOR LUMBAR FUSION  09/20/2011   Procedure: ANTERIOR LUMBAR FUSION 1 LEVEL;  Surgeon: Carmela Hurt;  Location: [facility];  Service: Neurosurgery;  Laterality: N/A;  Lumbar five-Sacral One Anterior Lumbar Interbody Fusion /Dr. Early to Approach    APPLICATION OF WOUND VAC N/A 05/14/2022   Procedure: Removal of wound vac with closure of wound;  Surgeon: Coletta Memos, MD;  Location: [facility] OR;  Service: Neurosurgery;  Laterality: N/A;  Pt to be admitted on 05-12-2022   CERVICAL DISC SURGERY     WITH TITANIUM PLATE IN NECK---LEFT SIDE   CERVICAL SPINE SURGERY  03/24/2019   CESAREAN SECTION     EYE SURGERY Bilateral 2023   cataract   fibroidectomy     FRACTURE SURGERY  05/08/2010   Jones fracture left foot fifth metatarsal   HARDWARE REMOVAL Right 05/28/2016   Procedure: RIGHT ANKLE HARDWARE REMOVAL;  Surgeon: Salvatore Marvel, MD;  Location: [facility];  Service: Orthopedics;  Laterality: Right;   HARDWARE REMOVAL N/A 07/28/2020   Procedure: Removal of Lumbar Hardware;  Surgeon: Coletta Memos, MD;  Location: [facility] OR;  Service: Neurosurgery;  Laterality: N/A;   HERNIA  REPAIR     hiatal hernia   IR THORACIC DISC ASPIRATION W/IMG GUIDE  10/29/2023   IRRIGATION AND DEBRIDEMENT KNEE  04/09/2012   Procedure: IRRIGATION AND DEBRIDEMENT KNEE;  Surgeon: Forbes Ida., MD;  Location: [facility] OR;  Service: Orthopedics;  Laterality: Right;   JOINT REPLACEMENT  01/27/2012   left total knee and Right total knee   KNEE SURGERY  2005   Left knee arthroscopy   LAMINECTOMY WITH POSTERIOR LATERAL ARTHRODESIS LEVEL 4 Bilateral 03/22/2022   Procedure: Thoracic ten to Lumbar three Posterior lateral arthrodesis with screws;  Surgeon: Audie Bleacher, MD;  Location: [facility] OR;  Service: Neurosurgery;  Laterality: Bilateral;   LUMBAR WOUND  DEBRIDEMENT N/A 04/08/2022   Procedure: LUMBAR WOUND DEBRIDEMENT;  Surgeon: Audie Bleacher, MD;  Location: [facility] OR;  Service: Neurosurgery;  Laterality: N/A;   NASAL SEPTOPLASTY W/ TURBINOPLASTY  2007   due to recurrent sinusitis   ORIF ANKLE FRACTURE Right 06/21/2015   Procedure: OPEN REDUCTION INTERNAL FIXATION RIGHT DISTAL FIBULA  FRACTURE AND OPEN REDUCTION INTERNAL FIXATION SYNDESMOSIS ;  Surgeon: Elly Habermann, MD;  Location: [name];  Service: Orthopedics;  Laterality: Right;   PARTIAL HYSTERECTOMY     SHOULDER ARTHROSCOPY W/ ROTATOR CUFF REPAIR Right    SHOULDER SURGERY Left    SPINE SURGERY  2004   Cervical plate, ACDF   STERIOD INJECTION  01/27/2012   Procedure: STEROID INJECTION;  Surgeon: Genevie Kerns, MD;  Location: [facility] OR;  Service: Orthopedics;  Laterality: Right;   TEE WITHOUT CARDIOVERSION N/A 03/01/2020   Procedure: TRANSESOPHAGEAL ECHOCARDIOGRAM (TEE);  Surgeon: Hugh Madura, MD;  Location: [facility] ENDOSCOPY;  Service: Cardiovascular;  Laterality: N/A;   THORACIC DISCECTOMY  02/16/2020   Procedure: Thoracic ten-eleven Discectomy;  Surgeon: Audie Bleacher, MD;  Location: [facility] OR;  Service: Neurosurgery;;   THORACIC DISCECTOMY N/A 01/08/2024   Procedure: THORACIC NINE-TEN LAMINECTOMY AND DECOMPRESSION;  Surgeon: Audie Bleacher, MD;  Location: [facility];  Service: Neurosurgery;  Laterality: N/A;   TONSILLECTOMY  2007   TOTAL KNEE ARTHROPLASTY  01/27/2012   Procedure: TOTAL KNEE ARTHROPLASTY;  Surgeon: Genevie Kerns, MD;  Bilateral   TOTAL KNEE ARTHROPLASTY  04/06/2012   Procedure: TOTAL KNEE ARTHROPLASTY;  Surgeon: Genevie Kerns, MD;  Location: [facility] OR;  Service: Orthopedics;  Laterality: Right;   TUBAL LIGATION     UTERINE FIBROID SURGERY     mid 200s   WOUND EXPLORATION N/A 02/27/2020   Procedure: WOUND EXPLORATION;  Surgeon: Audie Bleacher, MD;  Location: [facility] OR;  Service: Neurosurgery;  Laterality: N/A;,  (wound vac upper back)   WOUND EXPLORATION N/A 03/30/2022    Procedure: THORACIC WOUND EXPLORATION WITH REPAIR OF CSF LEAK;  Surgeon: Audie Bleacher, MD;  Location: [facility] OR;  Service: Neurosurgery;  Laterality: N/A;    Assessment & Plan Clinical Impression: Patient is a 63 year old female with history of asthma, Anxiety d/o, chronic LBP, depression, Fibromyalgia, HTN,  obesity, MSSA hardware associated lumbar wound infection treated with IV antibiotics and antibiotic suppression 2021, thoracic wound drainage due to enterobacter and underwent lumbar wound debridement and repair of CSF leak 03/2022 delayed wound healing and prolonged course of antibiotics. She was found to have T9 discitis/osteomyelitis with epidural abscess/phlegmon and BLE weakness/numbness on Jan 2025 admission. She was discharged to home on IV cefepime but had issues with occluded PICC requiring readmission 02/19-02/27 and with plans for extended antibiotics thru 01/05/24 followed by oral suppression. She was admitted antibiotics thru 12/30/23 with dislodged PICC,  found to have severe hypokalemia K-2.7 which was supplemented and she was d/c to home with antibiotics extended to 01/19/24. She was admitted on 01/08/24 for laminectomy with decompression of spinal stenosis T9-T10. Patient transferred to CIR on 01/19/2024 .    Patient currently requires {LGX:2119417} with {EYC:1448185} secondary to {UDJSHFWYOVZ:8588502}.  Prior to hospitalization, patient could complete *** with {DXA:1287867}.  Patient will benefit from skilled intervention to {benefit of skilled intervention:3041641} prior to discharge {discharge:3041642}.  Anticipate patient will require {supervision/assistance:22779} and {follow EH:2094709}.      OT Evaluation Precautions/Restrictions  Restrictions Weight Bearing Restrictions Per Provider Order: No  Pain   Home Living/Prior Functioning   Vision   Perception    Praxis   Cognition   Sensation   Motor     Trunk/Postural Assessment     Balance   Extremity/Trunk  Assessment      Care Tool Care Tool Self Care Eating        Oral Care         Bathing              Upper Body Dressing(including orthotics)            Lower Body Dressing (excluding footwear)          Putting on/Taking off footwear             Care Tool Toileting Toileting activity         Care Tool Bed Mobility Roll left and right activity        Sit to lying activity        Lying to sitting on side of bed activity         Care Tool Transfers Sit to stand transfer        Chair/bed transfer         Toilet transfer         Care Tool Cognition  Expression of Ideas and Wants    Understanding Verbal and Non-Verbal Content     Memory/Recall Ability     Refer to Care Plan for Long Term Goals  SHORT TERM GOAL WEEK 1    Recommendations for other services: {RECOMMENDATIONS FOR OTHER SERVICES:3049016}   Skilled Therapeutic Intervention ADL   Mobility    Skilled Intervention   Discharge Criteria: Patient will be discharged from OT if patient refuses treatment 3 consecutive times without medical reason, if treatment goals not met, if there is a change in medical status, if patient makes no progress towards goals or if patient is discharged from hospital.  The above assessment, treatment plan, treatment alternatives and goals were discussed and mutually agreed upon: by patient  Carollee Circle, OTR/L,CBIS  Supplemental OT - [facility] Secure Chat Preferred   01/19/2024, 10:01 PM

## 2024-01-19 NOTE — Progress Notes (Signed)
 Ranelle Oyster, MD  Physician Physical Medicine and Rehabilitation   PMR Pre-admission    Signed   Date of Service: 01/19/2024 12:02 PM  Related encounter: Admission (Current) from 01/08/2024 in [facility]   Signed     Expand All Collapse All  PMR Admission Coordinator Pre-Admission Assessment   Patient: [name] is an 63 y.o., female MRN: [id number] DOB: [birth year] Height: 5\' 2"  (157.5 cm) Weight: 94.8 kg   Insurance Information HMO: yes    PPO:      PCP:      IPA:      80/20:      OTHER:  PRIMARY: U[employer] Medicare      Policy#: [id number]      Subscriber: pt CM Name: Marylene Land      Phone#: [phone]     Fax#: [phone] Pre-Cert#: [id number] auth for CIR following peer to peer with Dr. Riley Kill, updates due to fax listed above on 4/21      Employer:  Benefits:  Phone #: [phone]     Name:  Eff. Date: 11/08/23     Deduct: $257 (met)      Out of Pocket Max: [phone] (met [phone])      Life Max:  CIR: $1610/admit      SNF: in copay days Outpatient: 80%     Co-Pay: 20% Home Health: 100%      Co-Pay:  DME: 80%     Co-Pay: 20% Providers:  SECONDARY:  *should have Medicaid but it is not active per [employer]*      Policy#:      Phone#:    Artist:       Phone#:    The Engineer, materials Information Summary" for patients in Inpatient Rehabilitation Facilities with attached "Privacy Act Statement-Health Care Records" was provided and verbally reviewed with: Patient   Emergency Contact Information Contact Information       Name Relation Home Work Mobile    Hansell Son     [phone]    Loma Messing [phone]   [phone]         Other Contacts       Name Relation Home Work Mobile    Gibson Sister [phone]   [phone]           Current Medical History  Patient Admitting Diagnosis: spinal canal stenosis 2/2 T9/10 chronic discitis   History of Present Illness: Pt is a 63  y/o female with PMH of chronic back pain, depression, scoliosis, IBS, fibromyalgia, HTN, obesity, T9 discitis/osteomyelitis, and T9 epidural abscess who was admitted to [facility] on 01/08/24 following outpatient imaging which showed possible T9 recurrence of discitis/osteomyelitis.  She underwent T9/10 laminectomy/decompression per Dr. Franky Macho on 4/3.  Post op occasional bladder incontinence, no bowel incontinence.  Hospital course pain control and mobility decline.  Therapy evaluations completed and pt was recommended for CIR.    Patient's medical record from [facility] has been reviewed by the rehabilitation admission coordinator and physician.   Past Medical History      Past Medical History:  Diagnosis Date   Anemia     Ankle syndesmosis disruption     Anxiety      takes Ativan and Valium, after mother passed   Arthritis      bilateral knees s/p knee replacement bilaterally   Asthma     Bronchitis     Bruising      pt states unexplained d/t fibromyalgia  Chronic back pain      2012 tailbone surgery and 3 lower discs.    Closed fracture of distal end of right fibula and tibia     Depression      from Fibromyalgia diagnosis; not taking medicine. since 2001   Dizziness      rarely   Fibromyalgia      diagnosed 2001   GERD (gastroesophageal reflux disease)      Prilosec occasionally   Headache(784.0)      "sinus headaches"   History of hiatal hernia     Hypertension      since 2013   Hyperthyroidism      subclinical, no treatment; thyroid nodules   IBS (irritable bowel syndrome)     Impaired memory      states from fibromyalgia   Insomnia      takes Ambien   Jones fracture      left foot fifth metatarsal   Multiple allergies      including latex, pet dander, shellfish, pet dander   Painful orthopaedic hardware right ankle 05/27/2016   Seasonal allergies     Shortness of breath      Occasional with exertion;    Sore gums      this is why pt is on Amoxil-only takes for  dental work   Tachycardia     Thyroid goiter     Varicose vein      protrudes above skin-per pt;vein popped and bruised;ultrasound done to make sure that there were no clots;noclots were found          Has the patient had major surgery during 100 days prior to admission? Yes   Family History   family history includes Alcohol abuse in her father; Arthritis in her sister; Depression in her sister; Diabetes in an other family member; Hypertension in her sister; Pulmonary embolism in her mother; Stroke in her father.   Current Medications  Current Medications    Current Facility-Administered Medications:    acetaminophen (TYLENOL) tablet 650 mg, 650 mg, Oral, Q4H PRN, 650 mg at 01/13/24 1208 **OR** acetaminophen (TYLENOL) suppository 650 mg, 650 mg, Rectal, Q4H PRN, Coletta Memos, MD   albuterol (PROVENTIL) (2.5 MG/3ML) 0.083% nebulizer solution 2.5 mg, 2.5 mg, Nebulization, Q6H PRN, Pham, Minh Q, RPH-CPP   amLODipine (NORVASC) tablet 5 mg, 5 mg, Oral, q AM, Coletta Memos, MD, 5 mg at 01/19/24 1610   ascorbic acid (VITAMIN C) tablet 500 mg, 500 mg, Oral, q AM, Coletta Memos, MD, 500 mg at 01/19/24 0555   aspirin EC tablet 81 mg, 81 mg, Oral, q AM, Coletta Memos, MD, 81 mg at 01/19/24 0555   atenolol (TENORMIN) tablet 50 mg, 50 mg, Oral, Daily, Coletta Memos, MD, 50 mg at 01/19/24 0818   bisacodyl (DULCOLAX) EC tablet 5 mg, 5 mg, Oral, Daily PRN, Coletta Memos, MD   ceFEPIme (MAXIPIME) 2 g in sodium chloride 0.9 % 100 mL IVPB, 2 g, Intravenous, Q8H, Cabbell, Kyle, MD, Last Rate: 200 mL/hr at 01/19/24 0826, 2 g at 01/19/24 0826   Chlorhexidine Gluconate Cloth 2 % PADS 6 each, 6 each, Topical, Q0600, Coletta Memos, MD, 6 each at 01/19/24 0649   chlorthalidone (HYGROTON) tablet 25 mg, 25 mg, Oral, Daily, Cabbell, Kyle, MD, 25 mg at 01/19/24 0818   chlorzoxazone (PARAFON) tablet 500 mg, 500 mg, Oral, QID, Leander Rams, RPH, 500 mg at 01/19/24 0818   cholecalciferol (VITAMIN D3) 25 MCG (1000  UNIT) tablet 1,000 Units, 1,000 Units, Oral,  Daily, Cabbell, Kyle, MD, 1,000 Units at 01/19/24 1610   cyanocobalamin (VITAMIN B12) tablet 1,000 mcg, 1,000 mcg, Oral, Weekly, Cabbell, Kyle, MD, 1,000 mcg at 01/16/24 1002   diazepam (VALIUM) tablet 5 mg, 5 mg, Oral, QHS, Cabbell, Kyle, MD, 5 mg at 01/18/24 2139   diphenhydrAMINE (BENADRYL) capsule 50 mg, 50 mg, Oral, Q8H PRN, Cabbell, Kyle, MD, 50 mg at 01/19/24 9604   feeding supplement (ENSURE ENLIVE / ENSURE PLUS) liquid 237 mL, 237 mL, Oral, BID BM, Cabbell, Kyle, MD, 237 mL at 01/19/24 0829   ferrous sulfate tablet 325 mg, 325 mg, Oral, q AM, Cabbell, Kyle, MD, 325 mg at 01/19/24 0555   gabapentin (NEURONTIN) capsule 1,200 mg, 1,200 mg, Oral, TID, Cabbell, Kyle, MD, 1,200 mg at 01/19/24 0817   heparin injection 5,000 Units, 5,000 Units, Subcutaneous, Q8H, Cabbell, Kyle, MD, 5,000 Units at 01/19/24 0553   lubiprostone (AMITIZA) capsule 48 mcg, 48 mcg, Oral, QAC lunch, Audie Bleacher, MD, 48 mcg at 01/19/24 1120   magnesium citrate solution 1 Bottle, 1 Bottle, Oral, Once PRN, Cabbell, Kyle, MD   menthol-cetylpyridinium (CEPACOL) lozenge 3 mg, 1 lozenge, Oral, PRN **OR** phenol (CHLORASEPTIC) mouth spray 1 spray, 1 spray, Mouth/Throat, PRN, Cabbell, Kyle, MD   morphine (PF) 2 MG/ML injection 2 mg, 2 mg, Intravenous, Q2H PRN, Cabbell, Kyle, MD, 2 mg at 01/16/24 0003   multivitamin with minerals tablet 1 tablet, 1 tablet, Oral, Daily, Cabbell, Kyle, MD, 1 tablet at 01/19/24 1120   ondansetron (ZOFRAN) injection 4 mg, 4 mg, Intravenous, Q6H PRN, Cabbell, Kyle, MD   ondansetron (ZOFRAN-ODT) disintegrating tablet 4 mg, 4 mg, Oral, Q6H PRN, Cabbell, Kyle, MD, 4 mg at 01/14/24 1809   oxyCODONE (Oxy IR/ROXICODONE) immediate release tablet 10 mg, 10 mg, Oral, Q3H PRN, Cabbell, Kyle, MD, 10 mg at 01/19/24 5409   oxyCODONE (Oxy IR/ROXICODONE) immediate release tablet 5 mg, 5 mg, Oral, Q3H PRN, Cabbell, Kyle, MD, 5 mg at 01/11/24 1626   oxyCODONE (OXYCONTIN) 12  hr tablet 10 mg, 10 mg, Oral, Q12H, Cabbell, Jenine Mix, MD, 10 mg at 01/19/24 0939   pantoprazole (PROTONIX) EC tablet 40 mg, 40 mg, Oral, Daily, Cabbell, Kyle, MD, 40 mg at 01/19/24 0817   senna (SENOKOT) tablet 8.6 mg, 1 tablet, Oral, BID, Cabbell, Kyle, MD, 8.6 mg at 01/19/24 0817   senna-docusate (Senokot-S) tablet 1 tablet, 1 tablet, Oral, QHS PRN, Cabbell, Kyle, MD   sodium chloride flush (NS) 0.9 % injection 10-40 mL, 10-40 mL, Intracatheter, Q12H, Cabbell, Kyle, MD, 10 mL at 01/19/24 0829   sodium chloride flush (NS) 0.9 % injection 10-40 mL, 10-40 mL, Intracatheter, PRN, Cabbell, Kyle, MD   sodium chloride flush (NS) 0.9 % injection 3 mL, 3 mL, Intravenous, Q12H, Cabbell, Kyle, MD, 3 mL at 01/19/24 0829   sodium chloride flush (NS) 0.9 % injection 3 mL, 3 mL, Intravenous, PRN, Cabbell, Kyle, MD     Patients Current Diet:  Diet Order                  Diet regular Room service appropriate? Yes; Fluid consistency: Thin  Diet effective now                         Precautions / Restrictions Precautions Precautions: Fall, Back Precaution Booklet Issued: Yes (comment) Precaution/Restrictions Comments: pt able to verbalize precautions, but not adhere during movement despite cues Restrictions Weight Bearing Restrictions Per Provider Order: No    Has the patient had 2 or more  falls or a fall with injury in the past year? Yes   Prior Activity Level Limited Community (1-2x/wk): mod I with rollator, assist for iADLS from family/friends   Prior Functional Level Self Care: Did the patient need help bathing, dressing, using the toilet or eating? Independent   Indoor Mobility: Did the patient need assistance with walking from room to room (with or without device)? Independent   Stairs: Did the patient need assistance with internal or external stairs (with or without device)? Independent   Functional Cognition: Did the patient need help planning regular tasks such as shopping or  remembering to take medications? Independent   Patient Information Are you of Hispanic, Latino/a,or Spanish origin?: A. No, not of Hispanic, Latino/a, or Spanish origin What is your race?: B. Black or African American Do you need or want an interpreter to communicate with a doctor or health care staff?: 0. No   Patient's Response To:  Health Literacy and Transportation Is the patient able to respond to health literacy and transportation needs?: Yes Health Literacy - How often do you need to have someone help you when you read instructions, pamphlets, or other written material from your doctor or pharmacy?: Never In the past 12 months, has lack of transportation kept you from medical appointments or from getting medications?: No In the past 12 months, has lack of transportation kept you from meetings, work, or from getting things needed for daily living?: No   Journalist, newspaper / Equipment Home Equipment: Shower seat, Higher education careers adviser held shower head, Agricultural consultant (2 wheels), Rollator (4 wheels), Grab bars - tub/shower, Cane - single point, Adaptive equipment, BSC/3in1, Crutches, Tub bench   Prior Device Use: Indicate devices/aids used by the patient prior to current illness, exacerbation or injury? Walker   Current Functional Level Cognition   Orientation Level: Oriented X4    Extremity Assessment (includes Sensation/Coordination)   Upper Extremity Assessment: Generalized weakness, Right hand dominant  Lower Extremity Assessment: Defer to PT evaluation RLE Deficits / Details: pt reports difficulty moving and slighlty numb, grossly 4-/5 to MMT, weakest in knee flexion. no buckling with gait RLE Sensation: decreased light touch     ADLs   Overall ADL's : Needs assistance/impaired Eating/Feeding: Set up, Sitting Grooming: Wash/dry face, Oral care, Set up, Sitting Upper Body Bathing: Minimal assistance, Sitting Lower Body Bathing: Maximal assistance, Sitting/lateral leans, Sit to/from  stand Upper Body Dressing : Minimal assistance, Sitting Lower Body Dressing: Minimal assistance Lower Body Dressing Details (indicate cue type and reason): Pt verbalized using a cane at home to don pants, educated on use of reacher and first in last out Toilet Transfer: Minimal assistance, Ambulation, Rolling walker (2 wheels) Toilet Transfer Details (indicate cue type and reason): Simulated in room 3 steps towards recliner Toileting- Clothing Manipulation and Hygiene: Contact guard assist, Sit to/from stand, Cueing for compensatory techniques, Cueing for back precautions Toileting - Clothing Manipulation Details (indicate cue type and reason): Verbal and tacile cues used to maintainin back precautions Functional mobility during ADLs: Minimal assistance, Rolling walker (2 wheels), Cueing for sequencing General ADL Comments: Difficulty maintaing back precautions during ADLs     Mobility   Overal bed mobility: Needs Assistance Bed Mobility: Rolling, Sidelying to Sit Rolling: Contact guard assist, Used rails Sidelying to sit: HOB elevated, Used rails, Contact guard assist Sit to sidelying: Mod assist General bed mobility comments: cues for maintaining spinal precautions, pt able to verbalize "BLT" without cues. no     Transfers   Overall transfer level:  Needs assistance Equipment used: Rolling walker (2 wheels) Transfers: Sit to/from Stand, Bed to chair/wheelchair/BSC Sit to Stand: Min assist, From elevated surface Bed to/from chair/wheelchair/BSC transfer type:: Step pivot Step pivot transfers: Min assist General transfer comment: cues for back precautions, cues for bil UE use to power up from armrest of chair and slighlty elevated EOB.     Ambulation / Gait / Stairs / Wheelchair Mobility   Ambulation/Gait Ambulation/Gait assistance: Min assist, Mod assist Gait Distance (Feet): 6 Feet (2x6', 2x3') Assistive device: Rolling walker (2 wheels) Gait Pattern/deviations: Step-to pattern,  Decreased stride length, Decreased dorsiflexion - right, Trunk flexed, Decreased weight shift to right, Knee hyperextension - right, Narrow base of support General Gait Details: pt advanceing Rt LE with poor dorsiflexion noted. As pt fatigued greater difficulty noetd to lift and prevent adduction, mod assist provided for placement and stability in stance. Gait velocity: decreased Gait velocity interpretation: <1.31 ft/sec, indicative of household ambulator     Posture / Balance Balance Overall balance assessment: Needs assistance, History of Falls Sitting-balance support: Feet supported, No upper extremity supported Sitting balance-[name]: Fair Standing balance support: Bilateral upper extremity supported, During functional activity Standing balance-[name]: Poor Standing balance comment: Reliant on RW     Special needs/care consideration Continuous Drip IV  cefepime, Skin surgical incision to back, and Diabetic management yes    Previous Home Environment (from acute therapy documentation) Living Arrangements: Alone Available Help at Discharge: Family, Friend(s), Available PRN/intermittently Type of Home: House Home Layout: One level Home Access: Stairs to enter Entrance Stairs-Rails: Right, Left, Can reach both Entrance Stairs-Number of Steps: 5 Bathroom Shower/Tub: Engineer, manufacturing systems: Standard Home Care Services: No   Discharge Living Setting Plans for Discharge Living Setting: Patient's home Type of Home at Discharge: House Discharge Home Layout: One level Discharge Home Access: Stairs to enter Entrance Stairs-Rails: Can reach both Entrance Stairs-Number of Steps: 5 Discharge Bathroom Shower/Tub: Tub/shower unit Discharge Bathroom Toilet: Standard Discharge Bathroom Accessibility: Yes How Accessible: Accessible via walker Does the patient have any problems obtaining your medications?: No   Social/Family/Support Systems Anticipated Caregiver: pt will  need to reach mod I to go home to her home, she has family/friends that can provide PRN support for iADLs (meals, appointments, etc) Anticipated Caregiver's Contact Information: can try to update son Whitman [place]) but he lives in [place]  [phone] Caregiver Availability: Intermittent Discharge Plan Discussed with Primary Caregiver: Yes (pt) Is Caregiver In Agreement with Plan?: Yes (pt) Does Caregiver/Family have Issues with Lodging/Transportation while Pt is in Rehab?: No   Goals Patient/Family Goal for Rehab: PT/OT supervision to mod I, SLP n/a Expected length of stay: 14-16 days Additional Information: Discharge plan: pt will need to reach mod I/intermittent mod I to discharge home Pt/Family Agrees to Admission and willing to participate: Yes Program Orientation Provided & Reviewed with Pt/Caregiver Including Roles  & Responsibilities: Yes Additional Information Needs: Discharge plan: lives alone, has lots of intermittent support, but unlikely 24/7.  May be able to stay with her friend Rose at discharge.  Son cannot come from [place] to stay with her.  Barriers to Discharge: Lack of/limited family support, Home environment access/layout   Decrease burden of Care through IP rehab admission: n/a   Possible need for SNF placement upon discharge:  Potentially.  If pt does not reach intermittent mod I level could require short term SNF stay.  Has intermittent support from family/friends.  Could potentially stay at her friend's home at discharge.    Patient  Condition: I have reviewed medical records from [facility], spoken with CM, and patient. I met with patient at the bedside for inpatient rehabilitation assessment.  Patient will benefit from ongoing PT and OT, can actively participate in 3 hours of therapy a day 5 days of the week, and can make measurable gains during the admission.  Patient will also benefit from the coordinated team approach during an Inpatient Acute Rehabilitation admission.  The  patient will receive intensive therapy as well as Rehabilitation physician, nursing, social worker, and care management interventions.  Due to bladder management, safety, skin/wound care, disease management, medication administration, pain management, and patient education the patient requires 24 hour a day rehabilitation nursing.  The patient is currently min assist with mobility and basic ADLs.  Discharge setting and therapy post discharge at home with home health is anticipated.  Patient has agreed to participate in the Acute Inpatient Rehabilitation Program and will admit today.   Preadmission Screen Completed By:  Yatzari Jonsson E Eufelia Veno,PT, DPT 01/19/2024 12:03 PM ______________________________________________________________________   Discussed status with Dr. Rachel Budds on 01/19/24  at 12:13 PM  and received approval for admission today.   Admission Coordinator:  Mignon Bechler E Ichael Pullara, PT, time 12:13 PM Alanna Hu 01/19/24     Assessment/Plan: Diagnosis: Thoracic myelopathy Does the need for close, 24 hr/day Medical supervision in concert with the patient's rehab needs make it unreasonable for this patient to be served in a less intensive setting? Yes Co-Morbidities requiring supervision/potential complications: ID, neurogenic bowel and bladder. Pain, obesity, chronic pain Due to bladder management, bowel management, safety, skin/wound care, disease management, medication administration, pain management, and patient education, does the patient require 24 hr/day rehab nursing? Yes Does the patient require coordinated care of a physician, rehab nurse, PT, OT, and SLP to address physical and functional deficits in the context of the above medical diagnosis(es)? Yes Addressing deficits in the following areas: balance, endurance, locomotion, strength, transferring, bowel/bladder control, bathing, dressing, feeding, grooming, toileting, and psychosocial support Can the patient actively participate in an intensive therapy  program of at least 3 hrs of therapy 5 days a week? Yes The potential for patient to make measurable gains while on inpatient rehab is excellent Anticipated functional outcomes upon discharge from inpatient rehab: modified independent, supervision, and min assist PT, modified independent, supervision, and min assist OT, n/a SLP Estimated rehab length of stay to reach the above functional goals is: 14-21 days Anticipated discharge destination: Home 10. Overall Rehab/Functional Prognosis: excellent     MD Signature: Rawland Caddy, MD, [facility] Physical Medicine & Rehabilitation Medical Director Rehabilitation Services 01/19/2024          Revision History

## 2024-01-19 NOTE — H&P (Signed)
 Physical Medicine and Rehabilitation Admission H&P     CC: Functional deficits.      HPI: [name] is a 63 year old female with history of asthma, Anxiety d/o, chronic LBP, depression, Fibromyalgia, HTN,  obesity, MSSA hardware associated lumbar wound infection treated with IV antibiotics and antibiotic suppression 2021, thoracic wound drainage due to enterobacter and underwent lumbar wound debridement and repair of CSF leak 03/2022 delayed wound healing and prolonged course of antibiotics followed by Dr. Franky Macho and Dr. Eligah East. She was found to have T9 discitis/osteomyelitis with epidural abscess/phlegmon and BLE weakness/numbness on Jan 2025 admission. She was discharged to home on IV cefepime but had issues with occluded PICC requiring readmission 02/19-02/27 and with plans for extended antibiotics thru 01/05/24 followed by oral suppression. She was admitted antibiotics thru 12/30/23 with dislodged PICC, found to have severe hypokalemia K-2.7 which was supplemented and she was d/c to home with antibiotics extended to 01/19/24.    She was admitted on 01/08/24 for laminectomy with decompression of spinal stenosis T9-T10 by Dr Franky Macho.   Wound VAC to be removed today. ID recommends IV Cefepime while hospitalized followed by Cefadroxil/Cipro for at least a year. She lives alone and has limited mobility since Jan 2025 but was independent with cane prior to that. Neighbors/family  have been providing meals for couple of weeks PTA.  PT/OT has been working with patient who requires min to mod assist with mobility and min assist with ADLs. CIR recommended due to functional decline.      Review of Systems  Constitutional:  Negative for chills and fever.  HENT:  Negative for hearing loss and tinnitus.   Respiratory:  Negative for shortness of breath.        Tightness recently  Cardiovascular:  Negative for chest pain and palpitations.  Gastrointestinal:  Positive for heartburn and nausea (with  liquid antibiotic that she was using at home). Negative for abdominal pain and vomiting.  Musculoskeletal:  Positive for back pain and myalgias.  Skin:  Negative for rash.  Neurological:  Positive for sensory change (numbnes BLE and lower abdominal area.) and headaches (uses her narcotic as does not have tylenol).  Psychiatric/Behavioral:  The patient does not have insomnia.             Past Medical History:  Diagnosis Date   Anemia     Ankle syndesmosis disruption     Anxiety      takes Ativan and Valium, after mother passed   Arthritis      bilateral knees s/p knee replacement bilaterally   Asthma     Bronchitis     Bruising      pt states unexplained d/t fibromyalgia   Chronic back pain      2012 tailbone surgery and 3 lower discs.    Closed fracture of distal end of right fibula and tibia     Depression      from Fibromyalgia diagnosis; not taking medicine. since 2001   Dizziness      rarely   Fibromyalgia      diagnosed 2001   GERD (gastroesophageal reflux disease)      Prilosec occasionally   Headache(784.0)      "sinus headaches"   History of hiatal hernia     Hypertension      since 2013   Hyperthyroidism      subclinical, no treatment; thyroid nodules   IBS (irritable bowel syndrome)     Impaired memory  states from fibromyalgia   Insomnia      takes Ambien   Jones fracture      left foot fifth metatarsal   Multiple allergies      including latex, pet dander, shellfish, pet dander   Painful orthopaedic hardware right ankle 05/27/2016   Seasonal allergies     Shortness of breath      Occasional with exertion;    Sore gums      this is why pt is on Amoxil-only takes for dental work   Tachycardia     Thyroid goiter     Varicose vein      protrudes above skin-per pt;vein popped and bruised;ultrasound done to make sure that there were no clots;noclots were found               Past Surgical History:  Procedure Laterality Date   ANTERIOR CERVICAL  DECOMP/DISCECTOMY FUSION N/A 12/08/2020    Procedure: Cervical Four-Five Anterior cervical decompression/discectomy/fusion;  Surgeon: Coletta Memos, MD;  Location: [facility] OR;  Service: Neurosurgery;  Laterality: N/A;  anterior   ANTERIOR LUMBAR FUSION   09/20/2011    Procedure: ANTERIOR LUMBAR FUSION 1 LEVEL;  Surgeon: Carmela Hurt;  Location: [facility]UR[facility] ORS;  Service: Neurosurgery;  Laterality: N/A;  Lumbar five-Sacral One Anterior Lumbar Interbody Fusion /Dr. Early to Approach    APPLICATION OF WOUND VAC N/A 05/14/2022    Procedure: Removal of wound vac with closure of wound;  Surgeon: Coletta Memos, MD;  Location: [facility] OR;  Service: Neurosurgery;  Laterality: N/A;  Pt to be admitted on 05-12-2022   CERVICAL DISC SURGERY        WITH TITANIUM PLATE IN NECK---LEFT SIDE   CERVICAL SPINE SURGERY   03/24/2019   CESAREAN SECTION       EYE SURGERY Bilateral 2023    cataract   fibroidectomy       FRACTURE SURGERY   05/08/2010    Jones fracture left foot fifth metatarsal   HARDWARE REMOVAL Right 05/28/2016    Procedure: RIGHT ANKLE HARDWARE REMOVAL;  Surgeon: Salvatore Marvel, MD;  Location: [facility];  Service: Orthopedics;  Laterality: Right;   HARDWARE REMOVAL N/A 07/28/2020    Procedure: Removal of Lumbar Hardware;  Surgeon: Coletta Memos, MD;  Location: [facility] OR;  Service: Neurosurgery;  Laterality: N/A;   HERNIA REPAIR        hiatal hernia   IR THORACIC DISC ASPIRATION W/IMG GUIDE   10/29/2023   IRRIGATION AND DEBRIDEMENT KNEE   04/09/2012    Procedure: IRRIGATION AND DEBRIDEMENT KNEE;  Surgeon: Thera Flake., MD;  Location: [facility] OR;  Service: Orthopedics;  Laterality: Right;   JOINT REPLACEMENT   01/27/2012    left total knee and Right total knee   KNEE SURGERY   2005    Left knee arthroscopy   LAMINECTOMY WITH POSTERIOR LATERAL ARTHRODESIS LEVEL 4 Bilateral 03/22/2022    Procedure: Thoracic ten to Lumbar three Posterior lateral arthrodesis with screws;  Surgeon: Coletta Memos, MD;   Location: [facility] OR;  Service: Neurosurgery;  Laterality: Bilateral;   LUMBAR WOUND DEBRIDEMENT N/A 04/08/2022    Procedure: LUMBAR WOUND DEBRIDEMENT;  Surgeon: Coletta Memos, MD;  Location: [facility] OR;  Service: Neurosurgery;  Laterality: N/A;   NASAL SEPTOPLASTY W/ TURBINOPLASTY   2007    due to recurrent sinusitis   ORIF ANKLE FRACTURE Right 06/21/2015    Procedure: OPEN REDUCTION INTERNAL FIXATION RIGHT DISTAL FIBULA  FRACTURE AND OPEN REDUCTION INTERNAL FIXATION SYNDESMOSIS ;  Surgeon: Salvatore Marvel, MD;  Location: [facility];  Service: Orthopedics;  Laterality: Right;   PARTIAL HYSTERECTOMY       SHOULDER ARTHROSCOPY W/ ROTATOR CUFF REPAIR Right     SHOULDER SURGERY Left     SPINE SURGERY   2004    Cervical plate, ACDF   STERIOD INJECTION   01/27/2012    Procedure: STEROID INJECTION;  Surgeon: Nilda Simmer, MD;  Location: [facility] OR;  Service: Orthopedics;  Laterality: Right;   TEE WITHOUT CARDIOVERSION N/A 03/01/2020    Procedure: TRANSESOPHAGEAL ECHOCARDIOGRAM (TEE);  Surgeon: Jake Bathe, MD;  Location: [facility] ENDOSCOPY;  Service: Cardiovascular;  Laterality: N/A;   THORACIC DISCECTOMY   02/16/2020    Procedure: Thoracic ten-eleven Discectomy;  Surgeon: Coletta Memos, MD;  Location: [facility] OR;  Service: Neurosurgery;;   THORACIC DISCECTOMY N/A 01/08/2024    Procedure: THORACIC NINE-TEN LAMINECTOMY AND DECOMPRESSION;  Surgeon: Coletta Memos, MD;  Location: [facility] OR;  Service: Neurosurgery;  Laterality: N/A;   TONSILLECTOMY   2007   TOTAL KNEE ARTHROPLASTY   01/27/2012    Procedure: TOTAL KNEE ARTHROPLASTY;  Surgeon: Nilda Simmer, MD;  Bilateral   TOTAL KNEE ARTHROPLASTY   04/06/2012    Procedure: TOTAL KNEE ARTHROPLASTY;  Surgeon: Nilda Simmer, MD;  Location: [facility] OR;  Service: Orthopedics;  Laterality: Right;   TUBAL LIGATION       UTERINE FIBROID SURGERY        mid 200s   WOUND EXPLORATION N/A 02/27/2020    Procedure: WOUND EXPLORATION;  Surgeon: Coletta Memos, MD;  Location: [facility]  OR;  Service: Neurosurgery;  Laterality: N/A;,  (wound vac upper back)   WOUND EXPLORATION N/A 03/30/2022    Procedure: THORACIC WOUND EXPLORATION WITH REPAIR OF CSF LEAK;  Surgeon: Coletta Memos, MD;  Location: [facility] OR;  Service: Neurosurgery;  Laterality: N/A;               Family History  Problem Relation Age of Onset   Stroke Father          passed 2004 from pneumonia   Alcohol abuse Father     Pulmonary embolism Mother          after minor knee surgery leading to DVT   Arthritis Sister     Depression Sister     Hypertension Sister     Diabetes Other          grandmother   Anesthesia problems Neg Hx     Hypotension Neg Hx     Malignant hyperthermia Neg Hx     Pseudochol deficiency Neg Hx            Social History:  Lives alone and has been non ambulatory since 10/2023 and family/friends assisting since d/c from SNF. Son lives in [place].  She  reports that she quit smoking about 25 years ago. Her smoking use included cigarettes. She started smoking about 35 years ago. She has a 5 pack-year smoking history. She has never used smokeless tobacco. She reports that she does not drink alcohol and does not use drugs.     Allergies       Allergies  Allergen Reactions   Monosodium Glutamate Anaphylaxis and Swelling      Eyes swollen shut, facial swelling, tongue swelling.   Shellfish Allergy Anaphylaxis   Baclofen Nausea Only      Dizziness and increase muscle spasm   Celebrex [Celecoxib] Itching      Only allergic to generic brand  Contrast Media [Iodinated Contrast Media] Itching and Nausea Only      "could not walk"   Diclofenac Itching      Generic Diclefenac gel causes itching. Can take the name brand Voltaren gel   Gadolinium        Allergic to MRI contrast dye per patient.   Other Other (See Comments)      Pet dander,            Latex Itching and Rash      Latex glove with powder; bite blocks used for EGD studies              Medications Prior to Admission   Medication Sig Dispense Refill   Acetaminophen (TYLENOL PO) Take 1 tablet by mouth as needed.       amLODipine (NORVASC) 5 MG tablet Take 1 tablet (5 mg total) by mouth daily. (Patient taking differently: Take 5 mg by mouth in the morning.)       ascorbic acid (VITAMIN C) 500 MG tablet Take 500 mg by mouth in the morning.       aspirin EC 81 MG tablet Take 81 mg by mouth in the morning.       atenolol (TENORMIN) 50 MG tablet Take 1 tablet (50 mg total) by mouth daily. (Patient taking differently: Take 50 mg by mouth in the morning.) 30 tablet 0   chlorthalidone (HYGROTON) 25 MG tablet Take 1 tablet (25 mg total) by mouth daily. (Patient taking differently: Take 25 mg by mouth in the morning.)       chlorzoxazone (PARAFON) 500 MG tablet Take 1 tablet (500 mg total) by mouth 3 (three) times daily. (Patient taking differently: Take 500 mg by mouth 4 (four) times daily.) 90 tablet 0   Cholecalciferol (VITAMIN D-3 PO) Take 1 tablet by mouth in the morning.       cyanocobalamin (VITAMIN B12) 1000 MCG tablet Take 1,000 mcg by mouth once a week.       diazepam (VALIUM) 5 MG tablet Take 1 tablet (5 mg total) by mouth 2 (two) times daily. (Patient taking differently: Take 5 mg by mouth See admin instructions. Take 5 mg at bedtime and additional 5 mg if needed for anxiety) 30 tablet 0   diclofenac Sodium (VOLTAREN) 1 % GEL Apply 2 g topically 4 (four) times daily as needed (pain).       diphenhydrAMINE (BENADRYL) 25 MG tablet Take 50 mg by mouth in the morning, at noon, in the evening, and at bedtime.       ferrous sulfate 325 (65 FE) MG EC tablet Take 325 mg by mouth in the morning.       gabapentin (NEURONTIN) 600 MG tablet Take 2 tablets (1,200 mg total) by mouth 3 (three) times daily. 180 tablet 0   lubiprostone (AMITIZA) 24 MCG capsule Take 1 capsule (24 mcg total) by mouth 2 (two) times daily with a meal. (Patient taking differently: Take 48 mcg by mouth daily before lunch.) 60 capsule 0   methocarbamol  (ROBAXIN) 500 MG tablet Take 500 mg by mouth 4 (four) times daily.       Multiple Minerals-Vitamins (CALCIUM-MAGNESIUM-ZINC-D3 PO) Take 1 tablet by mouth in the morning, at noon, and at bedtime.       omeprazole (PRILOSEC) 20 MG capsule Take 20 mg by mouth in the morning and at bedtime.        ondansetron (ZOFRAN-ODT) 4 MG disintegrating tablet Take 4 mg by mouth every 6 (six)  hours as needed.       oxyCODONE-acetaminophen (PERCOCET) 10-325 MG tablet Take 1 tablet by mouth every 6 (six) hours as needed for pain. 20 tablet 0   albuterol (VENTOLIN HFA) 108 (90 Base) MCG/ACT inhaler Inhale 2 puffs into the lungs every 6 (six) hours as needed for wheezing or shortness of breath. 18 g 0   ceFEPIme 2 g in sodium chloride 0.9 % 100 mL Inject 2 g into the vein every 8 (eight) hours.       celecoxib (CELEBREX) 200 MG capsule Take 200 mg by mouth 2 (two) times daily.       NARCAN 4 MG/0.1ML LIQD nasal spray kit Place 1 spray into the nose once. (Patient taking differently: Place 1 spray into the nose once as needed (opioid overdose).)                Home: Home Living Family/patient expects to be discharged to:: Private residence Living Arrangements: Alone Available Help at Discharge: Family, Friend(s), Available PRN/intermittently Type of Home: House Home Access: Stairs to enter Secretary/administrator of Steps: 5 Entrance Stairs-Rails: Right, Left, Can reach both Home Layout: One level Bathroom Shower/Tub: Engineer, manufacturing systems: Standard Home Equipment: Information systems manager, Higher education careers adviser held shower head, Agricultural consultant (2 wheels), Rollator (4 wheels), Grab bars - tub/shower, Cane - single point, Adaptive equipment, BSC/3in1, Crutches, Tub bench Adaptive Equipment: Reacher, Long-handled sponge   Functional History: Prior Function Prior Level of Function : History of Falls (last six months), Driving, Needs assist Mobility Comments: Mod I with rollator, cane or crutches during prior admission; but  endorses frequent falls. pt reports limited OOB mobility since DC home from SNF (reports therapy only worked on standing and stepping at bedside, not walking so pt unable to walk when discharged home) ADLs Comments: Unclear on ADL mgmt since DC from SNF. pt reports due to weakness, was using [facility] for toileting and neighbors and family bringing meals to pt.   Functional Status:  Mobility: Bed Mobility Overal bed mobility: Needs Assistance Bed Mobility: Rolling, Sidelying to Sit Rolling: Contact guard assist, Used rails Sidelying to sit: HOB elevated, Used rails, Contact guard assist Sit to sidelying: Mod assist General bed mobility comments: cues for maintaining spinal precautions, pt able to verbalize "BLT" without cues. no Transfers Overall transfer level: Needs assistance Equipment used: Rolling walker (2 wheels) Transfers: Sit to/from Stand, Bed to chair/wheelchair/BSC Sit to Stand: Min assist, From elevated surface Bed to/from chair/wheelchair/BSC transfer type:: Step pivot Step pivot transfers: Min assist General transfer comment: cues for back precautions, cues for bil UE use to power up from armrest of chair and slighlty elevated EOB. Ambulation/Gait Ambulation/Gait assistance: Min assist, Mod assist Gait Distance (Feet): 6 Feet (2x6', 2x3') Assistive device: Rolling walker (2 wheels) Gait Pattern/deviations: Step-to pattern, Decreased stride length, Decreased dorsiflexion - right, Trunk flexed, Decreased weight shift to right, Knee hyperextension - right, Narrow base of support General Gait Details: pt advanceing Rt LE with poor dorsiflexion noted. As pt fatigued greater difficulty noetd to lift and prevent adduction, mod assist provided for placement and stability in stance. Gait velocity: decreased Gait velocity interpretation: <1.31 ft/sec, indicative of household ambulator   ADL: ADL Overall ADL's : Needs assistance/impaired Eating/Feeding: Set up, Sitting Grooming:  Wash/dry face, Oral care, Set up, Sitting Upper Body Bathing: Minimal assistance, Sitting Lower Body Bathing: Maximal assistance, Sitting/lateral leans, Sit to/from stand Upper Body Dressing : Minimal assistance, Sitting Lower Body Dressing: Minimal assistance Lower Body Dressing Details (indicate cue type  and reason): Pt verbalized using a cane at home to don pants, educated on use of reacher and first in last out Toilet Transfer: Minimal assistance, Ambulation, Rolling walker (2 wheels) Toilet Transfer Details (indicate cue type and reason): Simulated in room 3 steps towards recliner Toileting- Clothing Manipulation and Hygiene: Contact guard assist, Sit to/from stand, Cueing for compensatory techniques, Cueing for back precautions Toileting - Clothing Manipulation Details (indicate cue type and reason): Verbal and tacile cues used to maintainin back precautions Functional mobility during ADLs: Minimal assistance, Rolling walker (2 wheels), Cueing for sequencing General ADL Comments: Difficulty maintaing back precautions during ADLs   Cognition: Cognition Orientation Level: Oriented X4 Cognition Arousal: Alert Behavior During Therapy: WFL for tasks assessed/performed     Blood pressure (!) 165/78, pulse (!) 58, temperature 98.6 F (37 C), resp. rate 18, height 5\' 2"  (1.575 m), weight 94.8 kg, last menstrual period 11/20/2012, SpO2 99%. Physical Exam Vitals and nursing note reviewed.  Constitutional:      Appearance: Normal appearance.  HENT:     Head: Normocephalic.     Right Ear: External ear normal.     Left Ear: External ear normal.     Mouth/Throat:     Pharynx: Oropharynx is clear.  Eyes:     Extraocular Movements: Extraocular movements intact.     Conjunctiva/sclera: Conjunctivae normal.     Pupils: Pupils are equal, round, and reactive to light.  Cardiovascular:     Rate and Rhythm: Normal rate and regular rhythm.     Heart sounds: No murmur heard.    No gallop.   Pulmonary:     Effort: Pulmonary effort is normal. No respiratory distress.     Breath sounds: No wheezing.  Abdominal:     General: Bowel sounds are normal. There is no distension.  Musculoskeletal:        General: Tenderness (right ankle with limited ROM d/t prior injury) and deformity present.     Cervical back: Normal range of motion.  Skin:    General: Skin is warm.     Comments: Back incision dressed  Neurological:     Mental Status: She is alert.     Comments: Alert and oriented x 3. Normal insight and awareness. Intact Memory. Normal language and speech. Cranial nerve exam unremarkable. MMT: RUE 4+ to 5/5 prox to distal with sl limitations due to DJD of right shoulder, prior RTC injury. LUE 4/5 to 4+/5 prox to distal with more limitation due to prior left shoulder/RTC injury. RLE 2/5 HF, KE 2/5 APF and 1+ ADF with lmitations due to ankle. LLE 3-/5 HF, KE and 3 to 3+ ADF/APF. Decreased LT R>L from below the umbilicus. Scattered sensory loss in arms which patient states his "chronic" due to "neuropathy".  DTR's 1+ in LE's. No appreciable resting tone.     Psychiatric:        Mood and Affect: Mood normal.        Behavior: Behavior normal.        Lab Results Last 48 Hours  No results found for this or any previous visit (from the past 48 hours).   Imaging Results (Last 48 hours)  No results found.         Blood pressure (!) 165/78, pulse (!) 58, temperature 98.6 F (37 C), resp. rate 18, height 5\' 2"  (1.575 m), weight 94.8 kg, last menstrual period 11/20/2012, SpO2 99%.   Medical Problem List and Plan: 1. Functional deficits secondary to T9/10 myelopathy d/t severe  spinal stenosis  and hx of T9 discitis/osteomyelitis             -patient may  shower             -ELOS/Goals: 14-21 days, goals are mod I to supervision/min assist with PT and OT 2.  Antithrombotics: -DVT/anticoagulation:  Pharmaceutical: Lovenox             -antiplatelet therapy: N/A 3. Chronic Pain/Pain  Management: Followed by [facility] on [address]/Dr. Wynelle Heather [facility]) --Gabapentin 1200 mg TID. Tylenol prn.Parafon 500 mg qid.              --oxycodone prn. Oxycontin 10 mg bid. 4. Mood/Behavior/Sleep: LCSW to follow for evaluation and support.              -antipsychotic agents: N/A.              --Has insomnia does not use CPAP "forgets to use it"/Uses valium for insomnia? Panic attacks? --Melatonin prn added 5. Neuropsych/cognition: This patient is capable of making decisions on her own behalf. 6. Skin/Wound Care: vac dc'ed.  - apply dry dressing to wound for now. 7. Fluids/Electrolytes/Nutrition: Monitor I/O.               --Vitamin supplement and prostat to help promote wound healing.  8. Enterobacter/MSSA Lumbar infection: Continue Cefepime while hospitalized and transition to Cipro 500 mg bid/Cefadroxil 1 gram bid at discharge.             --benadryl 50 mg tid prn allergies-->has been using couple of times a dayu             --follow up with Dr. Zelda Hickman 9. Recurrent Hypokalemia: Last check  04/10 showed K-3.0 and not supplemented.              --supplement and check in AM  10.H/o IBS- C: Was not taking iron at home--will d/c.             --Black stools due to iron supplement.  -- Continue Amitiza and order stool guaiacs.  -there is also likely a component of neurogenic bowel as patient was having incontinence PTA 11. HTN: Monitor BP TID --on Amlodipine, tenormin and Chlorthalidone. --likely need potassium supplement due to chlorthalidone.   12. Bowel incontinence: Reports incontinence during sleep for past 2 years.  13. Neurogenic Bladder/ incontinence: Has been incontinent of bladder at nights and during the day.  --Will monitor voiding with PVR checks.  14. OSA: Uses CPAP at home--will resume.          Zelda Hickman, PA-C 01/19/2024  I have personally performed a face to face diagnostic evaluation of this patient and formulated the key components of the plan.   Additionally, I have personally reviewed laboratory data, imaging studies, as well as relevant notes and concur with the physician assistant's documentation above.  The patient's status has not changed from the original H&P.  Any changes in documentation from the acute care chart have been noted above.  Rawland Caddy, MD, [place]

## 2024-01-19 NOTE — Progress Notes (Addendum)
 Inpatient Rehab Admissions Coordinator:   Dr. Rachel Budds completed peer to peer this AM and pt is approved for CIR.  I've let pt know and she is in agreement to pursue CIR admit today pending clearance from Dr. Michale Age.  She will notify family.  I've paged Dr. Michale Age to let him know we can admit today.   125 PM: called CNS office for d/c order.    1546: D/C order in, we will admit to CIR today.   Loye Rumble, PT, DPT Admissions Coordinator [phone] 01/19/24  11:53 AM

## 2024-01-19 NOTE — Care Management Important Message (Signed)
 Important Message  Patient Details  Name: [name] MRN: [id number] Date of Birth: [birth year]   Important Message Given:  Yes - Medicare IM     Felix Host 01/19/2024, 3:30 PM

## 2024-01-19 NOTE — PMR Pre-admission (Signed)
 PMR Admission Coordinator Pre-Admission Assessment  Patient: [name] is an 63 y.o., female MRN: [id number] DOB: 12/31/1960 Height: 5\' 2"  (157.5 cm) Weight: 94.8 kg  Insurance Information HMO: yes    PPO:      PCP:      IPA:      80/20:      OTHER:  PRIMARY: [employer] Medicare      Policy#: [id number]      Subscriber: pt CM Name: Marylene Land      Phone#: [phone]     Fax#: [phone] Pre-Cert#: [id number] auth for CIR following peer to peer with Dr. Riley Kill, updates due to fax listed above on 4/21      Employer:  Benefits:  Phone #: [phone]     Name:  Eff. Date: 11/08/23     Deduct: $257 (met)      Out of Pocket Max: [phone] (met [phone])      Life Max:  CIR: $1610/admit      SNF: in copay days Outpatient: 80%     Co-Pay: 20% Home Health: 100%      Co-Pay:  DME: 80%     Co-Pay: 20% Providers:  SECONDARY:  *should have Medicaid but it is not active per [employer]*      Policy#:      Phone#:   Artist:       Phone#:   The Engineer, materials Information Summary" for patients in Inpatient Rehabilitation Facilities with attached "Privacy Act Statement-Health Care Records" was provided and verbally reviewed with: Patient  Emergency Contact Information Contact Information     Name Relation Home Work Mobile   [place] Son   [phone]   Loma Messing [phone]  [phone]      Other Contacts     Name Relation Home Work Mobile   [place] Sister [phone]  [phone]       Current Medical History  Patient Admitting Diagnosis: spinal canal stenosis 2/2 T9/10 chronic discitis  History of Present Illness: Pt is a 63 y/o female with PMH of chronic back pain, depression, scoliosis, IBS, fibromyalgia, HTN, obesity, T9 discitis/osteomyelitis, and T9 epidural abscess who was admitted to [facility] on 01/08/24 following outpatient imaging which showed possible T9 recurrence of discitis/osteomyelitis.  She underwent T9/10  laminectomy/decompression per Dr. Franky Macho on 4/3.  Post op occasional bladder incontinence, no bowel incontinence.  Hospital course pain control and mobility decline.  Therapy evaluations completed and pt was recommended for CIR.     Patient's medical record from [facility] has been reviewed by the rehabilitation admission coordinator and physician.  Past Medical History  Past Medical History:  Diagnosis Date   Anemia    Ankle syndesmosis disruption    Anxiety    takes Ativan and Valium, after mother passed   Arthritis    bilateral knees s/p knee replacement bilaterally   Asthma    Bronchitis    Bruising    pt states unexplained d/t fibromyalgia   Chronic back pain    2012 tailbone surgery and 3 lower discs.    Closed fracture of distal end of right fibula and tibia    Depression    from Fibromyalgia diagnosis; not taking medicine. since 2001   Dizziness    rarely   Fibromyalgia    diagnosed 2001   GERD (gastroesophageal reflux disease)    Prilosec occasionally   Headache(784.0)    "sinus headaches"   History of hiatal hernia    Hypertension    since 2013  Hyperthyroidism    subclinical, no treatment; thyroid nodules   IBS (irritable bowel syndrome)    Impaired memory    states from fibromyalgia   Insomnia    takes Ambien   Jones fracture    left foot fifth metatarsal   Multiple allergies    including latex, pet dander, shellfish, pet dander   Painful orthopaedic hardware right ankle 05/27/2016   Seasonal allergies    Shortness of breath    Occasional with exertion;    Sore gums    this is why pt is on Amoxil-only takes for dental work   Tachycardia    Thyroid goiter    Varicose vein    protrudes above skin-per pt;vein popped and bruised;ultrasound done to make sure that there were no clots;noclots were found    Has the patient had major surgery during 100 days prior to admission? Yes  Family History   family history includes Alcohol abuse in her father;  Arthritis in her sister; Depression in her sister; Diabetes in an other family member; Hypertension in her sister; Pulmonary embolism in her mother; Stroke in her father.  Current Medications  Current Facility-Administered Medications:    acetaminophen (TYLENOL) tablet 650 mg, 650 mg, Oral, Q4H PRN, 650 mg at 01/13/24 1208 **OR** acetaminophen (TYLENOL) suppository 650 mg, 650 mg, Rectal, Q4H PRN, Cabbell, Kyle, MD   albuterol (PROVENTIL) (2.5 MG/3ML) 0.083% nebulizer solution 2.5 mg, 2.5 mg, Nebulization, Q6H PRN, Pham, Minh Q, RPH-CPP   amLODipine (NORVASC) tablet 5 mg, 5 mg, Oral, q AM, Cabbell, Kyle, MD, 5 mg at 01/19/24 0557   ascorbic acid (VITAMIN C) tablet 500 mg, 500 mg, Oral, q AM, Cabbell, Kyle, MD, 500 mg at 01/19/24 0555   aspirin EC tablet 81 mg, 81 mg, Oral, q AM, Cabbell, Kyle, MD, 81 mg at 01/19/24 0555   atenolol (TENORMIN) tablet 50 mg, 50 mg, Oral, Daily, Cabbell, Kyle, MD, 50 mg at 01/19/24 0818   bisacodyl (DULCOLAX) EC tablet 5 mg, 5 mg, Oral, Daily PRN, Cabbell, Kyle, MD   ceFEPIme (MAXIPIME) 2 g in sodium chloride 0.9 % 100 mL IVPB, 2 g, Intravenous, Q8H, Cabbell, Kyle, MD, Last Rate: 200 mL/hr at 01/19/24 0826, 2 g at 01/19/24 0826   Chlorhexidine Gluconate Cloth 2 % PADS 6 each, 6 each, Topical, Q0600, Cabbell, Kyle, MD, 6 each at 01/19/24 0981   chlorthalidone (HYGROTON) tablet 25 mg, 25 mg, Oral, Daily, Cabbell, Jenine Mix, MD, 25 mg at 01/19/24 0818   chlorzoxazone (PARAFON) tablet 500 mg, 500 mg, Oral, QID, Chen, Lydia D, RPH, 500 mg at 01/19/24 1914   cholecalciferol (VITAMIN D3) 25 MCG (1000 UNIT) tablet 1,000 Units, 1,000 Units, Oral, Daily, Cabbell, Kyle, MD, 1,000 Units at 01/19/24 0817   cyanocobalamin (VITAMIN B12) tablet 1,000 mcg, 1,000 mcg, Oral, Weekly, Cabbell, Kyle, MD, 1,000 mcg at 01/16/24 1002   diazepam (VALIUM) tablet 5 mg, 5 mg, Oral, QHS, Cabbell, Jenine Mix, MD, 5 mg at 01/18/24 2139   diphenhydrAMINE (BENADRYL) capsule 50 mg, 50 mg, Oral, Q8H PRN, Cabbell,  Kyle, MD, 50 mg at 01/19/24 7829   feeding supplement (ENSURE ENLIVE / ENSURE PLUS) liquid 237 mL, 237 mL, Oral, BID BM, Cabbell, Kyle, MD, 237 mL at 01/19/24 0829   ferrous sulfate tablet 325 mg, 325 mg, Oral, q AM, Cabbell, Kyle, MD, 325 mg at 01/19/24 0555   gabapentin (NEURONTIN) capsule 1,200 mg, 1,200 mg, Oral, TID, Cabbell, Kyle, MD, 1,200 mg at 01/19/24 0817   heparin injection 5,000 Units, 5,000 Units, Subcutaneous,  Q8H, Audie Bleacher, MD, 5,000 Units at 01/19/24 6578   lubiprostone (AMITIZA) capsule 48 mcg, 48 mcg, Oral, QAC lunch, Audie Bleacher, MD, 48 mcg at 01/19/24 1120   magnesium citrate solution 1 Bottle, 1 Bottle, Oral, Once PRN, Cabbell, Kyle, MD   menthol-cetylpyridinium (CEPACOL) lozenge 3 mg, 1 lozenge, Oral, PRN **OR** phenol (CHLORASEPTIC) mouth spray 1 spray, 1 spray, Mouth/Throat, PRN, Cabbell, Kyle, MD   morphine (PF) 2 MG/ML injection 2 mg, 2 mg, Intravenous, Q2H PRN, Cabbell, Kyle, MD, 2 mg at 01/16/24 0003   multivitamin with minerals tablet 1 tablet, 1 tablet, Oral, Daily, Cabbell, Kyle, MD, 1 tablet at 01/19/24 1120   ondansetron (ZOFRAN) injection 4 mg, 4 mg, Intravenous, Q6H PRN, Cabbell, Kyle, MD   ondansetron (ZOFRAN-ODT) disintegrating tablet 4 mg, 4 mg, Oral, Q6H PRN, Cabbell, Kyle, MD, 4 mg at 01/14/24 1809   oxyCODONE (Oxy IR/ROXICODONE) immediate release tablet 10 mg, 10 mg, Oral, Q3H PRN, Cabbell, Kyle, MD, 10 mg at 01/19/24 4696   oxyCODONE (Oxy IR/ROXICODONE) immediate release tablet 5 mg, 5 mg, Oral, Q3H PRN, Cabbell, Kyle, MD, 5 mg at 01/11/24 1626   oxyCODONE (OXYCONTIN) 12 hr tablet 10 mg, 10 mg, Oral, Q12H, Cabbell, Jenine Mix, MD, 10 mg at 01/19/24 0939   pantoprazole (PROTONIX) EC tablet 40 mg, 40 mg, Oral, Daily, Cabbell, Kyle, MD, 40 mg at 01/19/24 0817   senna (SENOKOT) tablet 8.6 mg, 1 tablet, Oral, BID, Cabbell, Kyle, MD, 8.6 mg at 01/19/24 0817   senna-docusate (Senokot-S) tablet 1 tablet, 1 tablet, Oral, QHS PRN, Cabbell, Kyle, MD   sodium  chloride flush (NS) 0.9 % injection 10-40 mL, 10-40 mL, Intracatheter, Q12H, Cabbell, Kyle, MD, 10 mL at 01/19/24 0829   sodium chloride flush (NS) 0.9 % injection 10-40 mL, 10-40 mL, Intracatheter, PRN, Cabbell, Kyle, MD   sodium chloride flush (NS) 0.9 % injection 3 mL, 3 mL, Intravenous, Q12H, Cabbell, Kyle, MD, 3 mL at 01/19/24 0829   sodium chloride flush (NS) 0.9 % injection 3 mL, 3 mL, Intravenous, PRN, Cabbell, Kyle, MD  Patients Current Diet:  Diet Order             Diet regular Room service appropriate? Yes; Fluid consistency: Thin  Diet effective now                   Precautions / Restrictions Precautions Precautions: Fall, Back Precaution Booklet Issued: Yes (comment) Precaution/Restrictions Comments: pt able to verbalize precautions, but not adhere during movement despite cues Restrictions Weight Bearing Restrictions Per Provider Order: No   Has the patient had 2 or more falls or a fall with injury in the past year? Yes  Prior Activity Level Limited Community (1-2x/wk): mod I with rollator, assist for iADLS from family/friends  Prior Functional Level Self Care: Did the patient need help bathing, dressing, using the toilet or eating? Independent  Indoor Mobility: Did the patient need assistance with walking from room to room (with or without device)? Independent  Stairs: Did the patient need assistance with internal or external stairs (with or without device)? Independent  Functional Cognition: Did the patient need help planning regular tasks such as shopping or remembering to take medications? Independent  Patient Information Are you of Hispanic, Latino/a,or Spanish origin?: A. No, not of Hispanic, Latino/a, or Spanish origin What is your race?: B. Black or African American Do you need or want an interpreter to communicate with a doctor or health care staff?: 0. No  Patient's Response To:  Health Literacy and  Transportation Is the patient able to respond  to health literacy and transportation needs?: Yes Health Literacy - How often do you need to have someone help you when you read instructions, pamphlets, or other written material from your doctor or pharmacy?: Never In the past 12 months, has lack of transportation kept you from medical appointments or from getting medications?: No In the past 12 months, has lack of transportation kept you from meetings, work, or from getting things needed for daily living?: No  Journalist, newspaper / Equipment Home Equipment: Shower seat, Higher education careers adviser held shower head, Agricultural consultant (2 wheels), Rollator (4 wheels), Grab bars - tub/shower, Cane - single point, Adaptive equipment, BSC/3in1, Crutches, Tub bench  Prior Device Use: Indicate devices/aids used by the patient prior to current illness, exacerbation or injury? Walker  Current Functional Level Cognition  Orientation Level: Oriented X4    Extremity Assessment (includes Sensation/Coordination)  Upper Extremity Assessment: Generalized weakness, Right hand dominant  Lower Extremity Assessment: Defer to PT evaluation RLE Deficits / Details: pt reports difficulty moving and slighlty numb, grossly 4-/5 to MMT, weakest in knee flexion. no buckling with gait RLE Sensation: decreased light touch    ADLs  Overall ADL's : Needs assistance/impaired Eating/Feeding: Set up, Sitting Grooming: Wash/dry face, Oral care, Set up, Sitting Upper Body Bathing: Minimal assistance, Sitting Lower Body Bathing: Maximal assistance, Sitting/lateral leans, Sit to/from stand Upper Body Dressing : Minimal assistance, Sitting Lower Body Dressing: Minimal assistance Lower Body Dressing Details (indicate cue type and reason): Pt verbalized using a cane at home to don pants, educated on use of reacher and first in last out Toilet Transfer: Minimal assistance, Ambulation, Rolling walker (2 wheels) Toilet Transfer Details (indicate cue type and reason): Simulated in room 3 steps towards  recliner Toileting- Clothing Manipulation and Hygiene: Contact guard assist, Sit to/from stand, Cueing for compensatory techniques, Cueing for back precautions Toileting - Clothing Manipulation Details (indicate cue type and reason): Verbal and tacile cues used to maintainin back precautions Functional mobility during ADLs: Minimal assistance, Rolling walker (2 wheels), Cueing for sequencing General ADL Comments: Difficulty maintaing back precautions during ADLs    Mobility  Overal bed mobility: Needs Assistance Bed Mobility: Rolling, Sidelying to Sit Rolling: Contact guard assist, Used rails Sidelying to sit: HOB elevated, Used rails, Contact guard assist Sit to sidelying: Mod assist General bed mobility comments: cues for maintaining spinal precautions, pt able to verbalize "BLT" without cues. no    Transfers  Overall transfer level: Needs assistance Equipment used: Rolling walker (2 wheels) Transfers: Sit to/from Stand, Bed to chair/wheelchair/BSC Sit to Stand: Min assist, From elevated surface Bed to/from chair/wheelchair/BSC transfer type:: Step pivot Step pivot transfers: Min assist General transfer comment: cues for back precautions, cues for bil UE use to power up from armrest of chair and slighlty elevated EOB.    Ambulation / Gait / Stairs / Wheelchair Mobility  Ambulation/Gait Ambulation/Gait assistance: Min assist, Mod assist Gait Distance (Feet): 6 Feet (2x6', 2x3') Assistive device: Rolling walker (2 wheels) Gait Pattern/deviations: Step-to pattern, Decreased stride length, Decreased dorsiflexion - right, Trunk flexed, Decreased weight shift to right, Knee hyperextension - right, Narrow base of support General Gait Details: pt advanceing Rt LE with poor dorsiflexion noted. As pt fatigued greater difficulty noetd to lift and prevent adduction, mod assist provided for placement and stability in stance. Gait velocity: decreased Gait velocity interpretation: <1.31 ft/sec,  indicative of household ambulator    Posture / Balance Balance Overall balance assessment: Needs assistance, History of  Falls Sitting-balance support: Feet supported, No upper extremity supported Sitting balance-[name]: Fair Standing balance support: Bilateral upper extremity supported, During functional activity Standing balance-[name]: Poor Standing balance comment: Reliant on RW    Special needs/care consideration Continuous Drip IV  cefepime, Skin surgical incision to back, and Diabetic management yes   Previous Home Environment (from acute therapy documentation) Living Arrangements: Alone Available Help at Discharge: Family, Friend(s), Available PRN/intermittently Type of Home: House Home Layout: One level Home Access: Stairs to enter Entrance Stairs-Rails: Right, Left, Can reach both Entrance Stairs-Number of Steps: 5 Bathroom Shower/Tub: Engineer, manufacturing systems: Standard Home Care Services: No  Discharge Living Setting Plans for Discharge Living Setting: Patient's home Type of Home at Discharge: House Discharge Home Layout: One level Discharge Home Access: Stairs to enter Entrance Stairs-Rails: Can reach both Entrance Stairs-Number of Steps: 5 Discharge Bathroom Shower/Tub: Tub/shower unit Discharge Bathroom Toilet: Standard Discharge Bathroom Accessibility: Yes How Accessible: Accessible via walker Does the patient have any problems obtaining your medications?: No  Social/Family/Support Systems Anticipated Caregiver: pt will need to reach mod I to go home to her home, she has family/friends that can provide PRN support for iADLs (meals, appointments, etc) Anticipated Caregiver's Contact Information: can try to update son Melody Comas) but he lives in   [phone] Caregiver Availability: Intermittent Discharge Plan Discussed with Primary Caregiver: Yes (pt) Is Caregiver In Agreement with Plan?: Yes (pt) Does Caregiver/Family have Issues with  Lodging/Transportation while Pt is in Rehab?: No  Goals Patient/Family Goal for Rehab: PT/OT supervision to mod I, SLP n/a Expected length of stay: 14-16 days Additional Information: Discharge plan: pt will need to reach mod I/intermittent mod I to discharge home Pt/Family Agrees to Admission and willing to participate: Yes Program Orientation Provided & Reviewed with Pt/Caregiver Including Roles  & Responsibilities: Yes Additional Information Needs: Discharge plan: lives alone, has lots of intermittent support, but unlikely 24/7.  May be able to stay with her friend Rose at discharge.  Son cannot come from [place] to stay with her.  Barriers to Discharge: Lack of/limited family support, Home environment access/layout  Decrease burden of Care through IP rehab admission: n/a  Possible need for SNF placement upon discharge:  Potentially.  If pt does not reach intermittent mod I level could require short term SNF stay.  Has intermittent support from family/friends.  Could potentially stay at her friend's home at discharge.   Patient Condition: I have reviewed medical records from [facility], spoken with CM, and patient. I met with patient at the bedside for inpatient rehabilitation assessment.  Patient will benefit from ongoing PT and OT, can actively participate in 3 hours of therapy a day 5 days of the week, and can make measurable gains during the admission.  Patient will also benefit from the coordinated team approach during an Inpatient Acute Rehabilitation admission.  The patient will receive intensive therapy as well as Rehabilitation physician, nursing, social worker, and care management interventions.  Due to bladder management, safety, skin/wound care, disease management, medication administration, pain management, and patient education the patient requires 24 hour a day rehabilitation nursing.  The patient is currently min assist with mobility and basic ADLs.  Discharge setting and therapy post  discharge at home with home health is anticipated.  Patient has agreed to participate in the Acute Inpatient Rehabilitation Program and will admit today.  Preadmission Screen Completed By:  Freddie Apley, DPT 01/19/2024 12:03 PM ______________________________________________________________________   Discussed status with Dr. Riley Kill on 01/19/24  at 12:13 PM  and received approval for admission today.  Admission Coordinator:  Caitlin E Warren, PT, time 12:13 PM Alanna Hu 01/19/24    Assessment/Plan: Diagnosis: Thoracic myelopathy Does the need for close, 24 hr/day Medical supervision in concert with the patient's rehab needs make it unreasonable for this patient to be served in a less intensive setting? Yes Co-Morbidities requiring supervision/potential complications: ID, neurogenic bowel and bladder. Pain, obesity, chronic pain Due to bladder management, bowel management, safety, skin/wound care, disease management, medication administration, pain management, and patient education, does the patient require 24 hr/day rehab nursing? Yes Does the patient require coordinated care of a physician, rehab nurse, PT, OT, and SLP to address physical and functional deficits in the context of the above medical diagnosis(es)? Yes Addressing deficits in the following areas: balance, endurance, locomotion, strength, transferring, bowel/bladder control, bathing, dressing, feeding, grooming, toileting, and psychosocial support Can the patient actively participate in an intensive therapy program of at least 3 hrs of therapy 5 days a week? Yes The potential for patient to make measurable gains while on inpatient rehab is excellent Anticipated functional outcomes upon discharge from inpatient rehab: modified independent, supervision, and min assist PT, modified independent, supervision, and min assist OT, n/a SLP Estimated rehab length of stay to reach the above functional goals is: 14-21 days Anticipated discharge  destination: Home 10. Overall Rehab/Functional Prognosis: excellent   MD Signature: Rawland Caddy, MD, [facility] Physical Medicine & Rehabilitation Medical Director Rehabilitation Services 01/19/2024

## 2024-01-19 NOTE — Discharge Summary (Signed)
 Physician Discharge Summary  Patient ID: [name] MRN: [id number] DOB/AGE: March 12, 1961 63 y.o.  Admit date: 01/08/2024 Discharge date: 01/19/2024  Admission Diagnoses: thoracic spinal stenosis T9/10  Discharge Diagnoses: same Principal Problem:   Thoracic stenosis Active Problems:   Thoracic spinal stenosis   Thyroid dysfunction   Irritable bowel syndrome   Discharged Condition: good  Hospital Course: [name] was admitted and taken to the operating room for an uncomplicated thoracic laminectomy to decompress the spinal canal. Post op she has done very well, working with PT, and OT. She will be discharged to CIR. Her wound is healing well. Wound vac can be removed. She is ambulating with assistance.  Treatments: surgery: THORACIC NINE-TEN LAMINECTOMY AND DECOMPRESSION   Discharge Exam: Blood pressure (!) 165/78, pulse (!) 58, temperature 98.6 F (37 C), resp. rate 18, height 5\' 2"  (1.575 m), weight 94.8 kg, last menstrual period 11/20/2012, SpO2 99%. General appearance: alert, cooperative, appears older than stated age, and no distress  Disposition: Discharge disposition: [facility] Not Defined      Spinal stenosis, thoracic region  Allergies as of 01/19/2024       Reactions   Monosodium Glutamate Anaphylaxis, Swelling   Eyes swollen shut, facial swelling, tongue swelling.   Shellfish Allergy Anaphylaxis   Baclofen Nausea Only   Dizziness and increase muscle spasm   Celebrex [celecoxib] Itching   Only allergic to generic brand   Contrast Media [iodinated Contrast Media] Itching, Nausea Only   "could not walk"   Diclofenac Itching   Generic Diclefenac gel causes itching. Can take the name brand Voltaren gel   Gadolinium    Allergic to MRI contrast dye per patient.   Other Other (See Comments)   Pet dander,    Latex Itching, Rash   Latex glove with powder; bite blocks used for EGD studies        Medication List     STOP  taking these medications    ceFEPIme 2 g in sodium chloride 0.9 % 100 mL       TAKE these medications    albuterol 108 (90 Base) MCG/ACT inhaler Commonly known as: VENTOLIN HFA Inhale 2 puffs into the lungs every 6 (six) hours as needed for wheezing or shortness of breath.   amLODipine 5 MG tablet Commonly known as: NORVASC Take 1 tablet (5 mg total) by mouth daily. What changed: when to take this   ascorbic acid 500 MG tablet Commonly known as: VITAMIN C Take 500 mg by mouth in the morning.   aspirin EC 81 MG tablet Take 81 mg by mouth in the morning.   atenolol 50 MG tablet Commonly known as: TENORMIN Take 1 tablet (50 mg total) by mouth daily. What changed: when to take this   CALCIUM-MAGNESIUM-ZINC-D3 PO Take 1 tablet by mouth in the morning, at noon, and at bedtime.   cefadroxil 500 MG capsule Commonly known as: DURICEF Take 2 capsules (1,000 mg total) by mouth 2 (two) times daily.   celecoxib 200 MG capsule Commonly known as: CELEBREX Take 200 mg by mouth 2 (two) times daily.   chlorthalidone 25 MG tablet Commonly known as: HYGROTON Take 1 tablet (25 mg total) by mouth daily. What changed: when to take this   chlorzoxazone 500 MG tablet Commonly known as: PARAFON Take 1 tablet (500 mg total) by mouth 3 (three) times daily. What changed: when to take this   ciprofloxacin 500 MG tablet Commonly known as: CIPRO Take 1 tablet (500  mg total) by mouth 2 (two) times daily.   cyanocobalamin 1000 MCG tablet Commonly known as: VITAMIN B12 Take 1,000 mcg by mouth once a week.   diazepam 5 MG tablet Commonly known as: VALIUM Take 1 tablet (5 mg total) by mouth 2 (two) times daily. What changed:  when to take this additional instructions   diclofenac Sodium 1 % Gel Commonly known as: VOLTAREN Apply 2 g topically 4 (four) times daily as needed (pain).   diphenhydrAMINE 25 MG tablet Commonly known as: BENADRYL Take 50 mg by mouth in the morning, at noon,  in the evening, and at bedtime.   ferrous sulfate 325 (65 FE) MG EC tablet Take 325 mg by mouth in the morning.   gabapentin 600 MG tablet Commonly known as: NEURONTIN Take 2 tablets (1,200 mg total) by mouth 3 (three) times daily.   lubiprostone 24 MCG capsule Commonly known as: Amitiza Take 1 capsule (24 mcg total) by mouth 2 (two) times daily with a meal. What changed:  how much to take when to take this   methocarbamol 500 MG tablet Commonly known as: ROBAXIN Take 500 mg by mouth 4 (four) times daily.   Narcan 4 MG/0.1ML Liqd nasal spray kit Generic drug: naloxone Place 1 spray into the nose once. What changed:  when to take this reasons to take this   omeprazole 20 MG capsule Commonly known as: PRILOSEC Take 20 mg by mouth in the morning and at bedtime.   ondansetron 4 MG disintegrating tablet Commonly known as: ZOFRAN-ODT Take 4 mg by mouth every 6 (six) hours as needed.   oxyCODONE-acetaminophen 10-325 MG tablet Commonly known as: PERCOCET Take 1 tablet by mouth every 6 (six) hours as needed for pain.   TYLENOL PO Take 1 tablet by mouth as needed.   VITAMIN D-3 PO Take 1 tablet by mouth in the morning.         SignedAudie Bleacher 01/19/2024, 2:11 PM

## 2024-01-20 ENCOUNTER — Ambulatory Visit: Admitting: Internal Medicine

## 2024-01-20 DIAGNOSIS — K592 Neurogenic bowel, not elsewhere classified: Secondary | ICD-10-CM

## 2024-01-20 DIAGNOSIS — M4714 Other spondylosis with myelopathy, thoracic region: Secondary | ICD-10-CM | POA: Diagnosis not present

## 2024-01-20 DIAGNOSIS — N319 Neuromuscular dysfunction of bladder, unspecified: Secondary | ICD-10-CM

## 2024-01-20 DIAGNOSIS — M4644 Discitis, unspecified, thoracic region: Secondary | ICD-10-CM | POA: Diagnosis not present

## 2024-01-20 LAB — CBC WITH DIFFERENTIAL/PLATELET
Abs Immature Granulocytes: 0.1 10*3/uL — ABNORMAL HIGH (ref 0.00–0.07)
Basophils Absolute: 0.1 10*3/uL (ref 0.0–0.1)
Basophils Relative: 1 %
Eosinophils Absolute: 0.8 10*3/uL — ABNORMAL HIGH (ref 0.0–0.5)
Eosinophils Relative: 8 %
HCT: 33.3 % — ABNORMAL LOW (ref 36.0–46.0)
Hemoglobin: 10.4 g/dL — ABNORMAL LOW (ref 12.0–15.0)
Immature Granulocytes: 1 %
Lymphocytes Relative: 25 %
Lymphs Abs: 2.7 10*3/uL (ref 0.7–4.0)
MCH: 25.4 pg — ABNORMAL LOW (ref 26.0–34.0)
MCHC: 31.2 g/dL (ref 30.0–36.0)
MCV: 81.4 fL (ref 80.0–100.0)
Monocytes Absolute: 1.2 10*3/uL — ABNORMAL HIGH (ref 0.1–1.0)
Monocytes Relative: 11 %
Neutro Abs: 6.1 10*3/uL (ref 1.7–7.7)
Neutrophils Relative %: 54 %
Platelets: 395 10*3/uL (ref 150–400)
RBC: 4.09 MIL/uL (ref 3.87–5.11)
RDW: 14.2 % (ref 11.5–15.5)
WBC: 10.9 10*3/uL — ABNORMAL HIGH (ref 4.0–10.5)
nRBC: 0 % (ref 0.0–0.2)

## 2024-01-20 LAB — COMPREHENSIVE METABOLIC PANEL WITH GFR
ALT: 23 U/L (ref 0–44)
AST: 22 U/L (ref 15–41)
Albumin: 2.7 g/dL — ABNORMAL LOW (ref 3.5–5.0)
Alkaline Phosphatase: 94 U/L (ref 38–126)
Anion gap: 9 (ref 5–15)
BUN: 20 mg/dL (ref 8–23)
CO2: 29 mmol/L (ref 22–32)
Calcium: 9.2 mg/dL (ref 8.9–10.3)
Chloride: 102 mmol/L (ref 98–111)
Creatinine, Ser: 0.8 mg/dL (ref 0.44–1.00)
GFR, Estimated: >60 mL/min (ref >60)
Glucose, Bld: 94 mg/dL (ref 70–99)
Potassium: 3.8 mmol/L (ref 3.5–5.1)
Sodium: 140 mmol/L (ref 135–145)
Total Bilirubin: <0.2 mg/dL (ref 0.0–1.2)
Total Protein: 5.9 g/dL — ABNORMAL LOW (ref 6.5–8.1)

## 2024-01-20 MED ORDER — CHLORHEXIDINE GLUCONATE CLOTH 2 % EX PADS
6.0000 | MEDICATED_PAD | Freq: Two times a day (BID) | CUTANEOUS | Status: DC
  Administered 2024-01-20 – 2024-02-02 (×26): 6 via TOPICAL

## 2024-01-20 NOTE — Progress Notes (Signed)
 Inpatient Rehabilitation Admission Medication Review by a Pharmacist  A complete drug regimen review was completed for this patient to identify any potential clinically significant medication issues.  High Risk Drug Classes Is patient taking? Indication by Medication  Antipsychotic No   Anticoagulant Yes Lovenox: VTE ppx  Antibiotic Yes, as an intravenous medication Cefepime while hospitalized followed by Cefadroxil/Cipro for at least a year: discitis/osteomyelitis with epidural abscess/phlegmon   Opioid Yes Oxycodone: pain  Antiplatelet Yes Aspirin: CVA ppx  Hypoglycemics/insulin No   Vasoactive Medication Yes Amlodipine, atenolol, chlorthalidone: BP  Chemotherapy No   Other Yes vitC, vitD, B12, KCl: supplement Chlorzoxazone, Robaxin: muscle spasms Diazepam: insomnia Gabapentin: pain Lubiprostone: IBS-C Protonix: GERD Tylenol: pain Albuterol: wheezing/SOB Senna, bisacodyl, Fleet: bowel regimen Eucerin cream, Sarna: skin care Mylanta: indigestion Benadryl: itching Robitussin: cough Lidocaine jelly: in and out cath Melatonin: sleep Cepacol, chloraseptic spray: throat pain/irritation Compazine: N/V     Type of Medication Issue Identified Description of Issue Recommendation(s)  Drug Interaction(s) (clinically significant)     Duplicate Therapy     Allergy     No Medication Administration End Date     Incorrect Dose     Additional Drug Therapy Needed     Significant med changes from prior encounter (inform family/care partners about these prior to discharge). PTA meds not currently ordered: Flexeril, diclofenac gel, FeSO4, hydrochlorothiazide, hydroxyzine, omeprazole, zofran Restart or discontinue as appropriate. Communicate medication changes with patient/family at discharge  Other       Clinically significant medication issues were identified that warrant physician communication and completion of prescribed/recommended actions by midnight of the next day:   No   Time spent performing this drug regimen review (minutes): 45  Thank you for allowing pharmacy to be a part of this patient's care.   Yehudis Monceaux C Shyasia Funches, PharmD 01/20/2024 1:05 PM  **Pharmacist phone directory can be found on [email or web address] listed under [facility]**

## 2024-01-20 NOTE — Progress Notes (Signed)
 Occupational Therapy Session Note  Patient Details  Name: [name] MRN: [id number] Date of Birth: 1961/02/07  Today's Date: 01/20/2024 OT Individual Time: 1350-1500 OT Individual Time Calculation (min): 70 min    Short Term Goals: Week 1:  OT Short Term Goal 1 (Week 1): Pt will complete LB bathing and dressing with Max A and use of AE. OT Short Term Goal 2 (Week 1): Pt will completed toileting with Max A  Skilled Therapeutic Interventions/Progress Updates:    1:! Pt received in the bed. PT transitioned to the EOB with mod A for assistance with both LEs and VC to maintain back precautions. From a slightly elevated bed (elevating at her request) pt able to come into standing with min A with shoes on and air cast on right ankle. With RW stand pivot transfer into w/c. Pt able to maintain balance to pull pants down and then use reacher to off pants and socks (Ot doffed shoes and air cast). Pt doffed shirt with setup . PICC line and back incision covered and pt transferred into the shower stall stand pivot to [facility]. Pt's brief was wet and pt reported she was aware. As soon as she sat down on the [facility] in the shower she voided but unaware until drew attention to it. PT showered sit to stand with min A and bathed all parts except for lower legs. Pt opted to don brief and gown after showering. Total A for donning socks. PT performed grooming at the sink with setup .  Transferred back into the bed from w/c with RW stand pivot transfer with more than reasonable amt of time and min A. Pt required A for both of her legs to get back into the bed. PT able to pull herself back up in the bed. Pt left resting in the bed with call bell and items at her bedside.  Bims also completed.    Therapy Documentation Precautions:  Precautions Precautions: Fall, Back Precaution Booklet Issued: Yes (comment) Recall of Precautions/Restrictions: Impaired Precaution/Restrictions Comments: Spinal precautions-No back  brace required. Required Braces or Orthoses: Other Brace Other Brace: Pt with personal air cast on R ankle. Pt reports ankle instability for ~10 years from previous ankle dislocation. Restrictions Weight Bearing Restrictions Per Provider Order: No General:   Vital Signs:   Pain: Just received meds and reports she is feeling okay for session - no pain  Therapy/Group: Individual Therapy  [name] Emma [facility] 01/20/2024, 2:02 PM

## 2024-01-20 NOTE — Progress Notes (Addendum)
 PROGRESS NOTE   Subjective/Complaints: Had a fair night. Still with vac. Didn't sleep well. Says she has insomnia at home. Was afraid to be asleep for first session today!   ROS: Patient denies fever, rash, sore throat, blurred vision, dizziness, nausea, vomiting, diarrhea, cough, shortness of breath or chest pain, headache, or mood change.    Objective:   No results found. Recent Labs    01/20/24 0533  WBC 10.9*  HGB 10.4*  HCT 33.3*  PLT 395   Recent Labs    01/20/24 0533  NA 140  K 3.8  CL 102  CO2 29  GLUCOSE 94  BUN 20  CREATININE 0.80  CALCIUM 9.2   No intake or output data in the 24 hours ending 01/20/24 1012      Physical Exam: Vital Signs Blood pressure 135/72, pulse 71, temperature 97.6 F (36.4 C), temperature source Oral, resp. rate 16, height 5\' 2"  (1.575 m), weight 96.4 kg, last menstrual period 11/20/2012, SpO2 100%.  General: Alert and oriented x 3, No apparent distress HEENT: Head is normocephalic, atraumatic, PERRLA, EOMI, sclera anicteric, oral mucosa pink and moist, dentition intact, ext ear canals clear,  Neck: Supple without JVD or lymphadenopathy Heart: Reg rate and rhythm. No murmurs rubs or gallops Chest: CTA bilaterally without wheezes, rales, or rhonchi; no distress Abdomen: Soft, non-tender, non-distended, bowel sounds positive. Extremities: No clubbing, cyanosis, or edema. Pulses are 2+ Psych: Pt's affect is appropriate. Pt is cooperative Skin: Back incision appears clean,dry. Some puckering of skin due weight, prior surgeries. Neuro:  Alert and oriented x 3. Normal insight and awareness. Intact Memory. Normal language and speech. Cranial nerve exam unremarkable. MMT: RUE 4+ to %/5 and LUE 4-/5 to 4+/5 prox to distal, both limited by shoulder pain/DJD. RLE 2/5 prox to 2/5 ADF/PF limited by right ankle. LLE 3-/5 HF, KE and 3+ADF/PF. Decreased LT below the umbilicus. DTR's  1+ Musculoskeletal: DJD/limited ROM both shoulders L>>R. Right ankle with limited ADF/PF, chronic changes noted. Aircast in place.  LB/midback tender.    Assessment/Plan: 1. Functional deficits which require 3+ hours per day of interdisciplinary therapy in a comprehensive inpatient rehab setting. Physiatrist is providing close team supervision and 24 hour management of active medical problems listed below. Physiatrist and rehab team continue to assess barriers to discharge/monitor patient progress toward functional and medical goals  Care Tool:  Bathing              Bathing assist       Upper Body Dressing/Undressing Upper body dressing        Upper body assist      Lower Body Dressing/Undressing Lower body dressing            Lower body assist       Toileting Toileting    Toileting assist       Transfers Chair/bed transfer  Transfers assist           Locomotion Ambulation   Ambulation assist              Walk 10 feet activity   Assist           Walk 50 feet activity  Assist           Walk 150 feet activity   Assist           Walk 10 feet on uneven surface  activity   Assist           Wheelchair     Assist               Wheelchair 50 feet with 2 turns activity    Assist            Wheelchair 150 feet activity     Assist          Blood pressure 135/72, pulse 71, temperature 97.6 F (36.4 C), temperature source Oral, resp. rate 16, height 5\' 2"  (1.575 m), weight 96.4 kg, last menstrual period 11/20/2012, SpO2 100%.  Medical Problem List and Plan: 1. Functional deficits secondary to T9/10 myelopathy d/t severe spinal stenosis  and hx of T9 discitis/osteomyelitis             -patient may  shower--pat wound dry after shower.              -ELOS/Goals: 14-21 days, goals are mod I to supervision/min assist with PT and OT  -Patient is beginning CIR therapies today including PT and OT    -I have contacted [employer] about brining in her AFO that apparently has already been constructed.  2.  Antithrombotics: -DVT/anticoagulation:  Pharmaceutical: Lovenox             -antiplatelet therapy: N/A 3. Chronic Pain/Pain Management: Followed by [facility] on [address]/Dr. Wynelle Heather [facility]) --Gabapentin 1200 mg TID. Tylenol prn.Parafon 500 mg qid.              --oxycodone prn. Oxycontin 10 mg bid.  4/15 pain control reasonable at present 4. Mood/Behavior/Sleep: LCSW to follow for evaluation and support.              -antipsychotic agents: N/A.              --Has insomnia does not use CPAP "forgets to use it"/Uses valium for insomnia? Panic attacks? --Melatonin prn added--encouraged her to use.  5. Neuropsych/cognition: This patient is capable of making decisions on her own behalf. 6. Skin/Wound Care: vac now dc'ed.  - apply dry dressing(ABD pad) to wound for now. 7. Fluids/Electrolytes/Nutrition: Monitor I/O.               --Vitamin supplement and prostat to help promote wound healing.   I personally reviewed the patient's labs today.  Encourage protein intake as albumin is low.  8. Enterobacter/MSSA Lumbar infection: Continue Cefepime while hospitalized and transition to Cipro 500 mg bid/Cefadroxil 1 gram bid at discharge.             --benadryl 50 mg tid prn allergies-->has been using couple of times a dayu             --follow up with Dr. Zelda Hickman  4/15 WBC's sl up to 10.9, monitor for trend, afebrile. Wound looks good 9. Recurrent Hypokalemia:  -4/15 today's level up to 3.8              --continue daily supplement   10.H/o IBS- C: Was not taking iron at home--will d/c.             --Black stools due to iron supplement.  -- Continue Amitiza and order stool guaiacs.  -there is also likely a component of neurogenic bowel as patient was having incontinence  PTA (2 years) 4/15 had an incontinent stool yesterday--monitor for continence, pattern  -consider bowel  program 11. HTN: Monitor BP TID --on Amlodipine, tenormin and Chlorthalidone. --likely need potassium supplement due to chlorthalidone.      13. Neurogenic Bladder/ incontinence/frequency: Has been incontinent of bladder at nights and during the day.  --need pvr's scanned, I/O cath for vol >350cc -intermittent incontinence over last 24 hours 14. OSA: Uses CPAP at home-- resume.     LOS: 1 days A FACE TO FACE EVALUATION WAS PERFORMED  Rawland Caddy 01/20/2024, 10:12 AM

## 2024-01-20 NOTE — Plan of Care (Signed)
  Problem: RH Balance Goal: LTG Patient will maintain dynamic sitting balance (PT) Description: LTG:  Patient will maintain dynamic sitting balance with assistance during mobility activities (PT) Flowsheets (Taken 01/20/2024 1705) LTG: Pt will maintain dynamic sitting balance during mobility activities with:: Independent with assistive device  Goal: LTG Patient will maintain dynamic standing balance (PT) Description: LTG:  Patient will maintain dynamic standing balance with assistance during mobility activities (PT) Flowsheets (Taken 01/20/2024 1705) LTG: Pt will maintain dynamic standing balance during mobility activities with:: Independent with assistive device    Problem: Sit to Stand Goal: LTG:  Patient will perform sit to stand with assistance level (PT) Description: LTG:  Patient will perform sit to stand with assistance level (PT) Flowsheets (Taken 01/20/2024 1705) LTG: PT will perform sit to stand in preparation for functional mobility with assistance level: Independent with assistive device   Problem: RH Bed Mobility Goal: LTG Patient will perform bed mobility with assist (PT) Description: LTG: Patient will perform bed mobility with assistance, with/without cues (PT). Flowsheets (Taken 01/20/2024 1705) LTG: Pt will perform bed mobility with assistance level of: Independent with assistive device    Problem: RH Bed to Chair Transfers Goal: LTG Patient will perform bed/chair transfers w/assist (PT) Description: LTG: Patient will perform bed to chair transfers with assistance (PT). Flowsheets (Taken 01/20/2024 1705) LTG: Pt will perform Bed to Chair Transfers with assistance level: Independent with assistive device    Problem: RH Car Transfers Goal: LTG Patient will perform car transfers with assist (PT) Description: LTG: Patient will perform car transfers with assistance (PT). Flowsheets (Taken 01/20/2024 1705) LTG: Pt will perform car transfers with assist:: Supervision/Verbal  cueing   Problem: RH Ambulation Goal: LTG Patient will ambulate in controlled environment (PT) Description: LTG: Patient will ambulate in a controlled environment, # of feet with assistance (PT). Flowsheets (Taken 01/20/2024 1705) LTG: Pt will ambulate in controlled environ  assist needed:: Independent with assistive device LTG: Ambulation distance in controlled environment: 150 feet with LRAD Goal: LTG Patient will ambulate in home environment (PT) Description: LTG: Patient will ambulate in home environment, # of feet with assistance (PT). Flowsheets (Taken 01/20/2024 1705) LTG: Pt will ambulate in home environ  assist needed:: Independent with assistive device LTG: Ambulation distance in home environment: 50 feet with LRAD   Problem: RH Stairs Goal: LTG Patient will ambulate up and down stairs w/assist (PT) Description: LTG: Patient will ambulate up and down # of stairs with assistance (PT) Flowsheets (Taken 01/20/2024 1705) LTG: Pt will ambulate up/down stairs assist needed:: Supervision/Verbal cueing LTG: Pt will  ambulate up and down number of stairs: 5 steps with LRAD for home entry

## 2024-01-20 NOTE — Progress Notes (Signed)
 Inpatient Rehabilitation  Patient information reviewed and entered into eRehab system by [facility]. Karen Kays., CCC/SLP, PPS Coordinator.  Information including medical coding, functional ability and quality indicators will be reviewed and updated through discharge.

## 2024-01-20 NOTE — Evaluation (Addendum)
 Physical Therapy Assessment and Plan  Patient Details  Name: [name] MRN: [id number] Date of Birth: [birth year]  PT Diagnosis: Abnormal posture, Abnormality of gait, Difficulty walking, Impaired sensation, Low back pain, and Muscle weakness Rehab Potential: Fair ELOS: 2-3 weeks   Today's Date: 01/20/2024 PT Individual Time: 8756-4332 PT Individual Time Calculation (min): 73 min    Hospital Problem: Principal Problem:   Thoracic discitis Active Problems:   Thoracic myelopathy   Past Medical History:  Past Medical History:  Diagnosis Date   Anemia    Ankle syndesmosis disruption    Anxiety    takes Ativan and Valium, after mother passed   Arthritis    bilateral knees s/p knee replacement bilaterally   Asthma    Bronchitis    Bruising    pt states unexplained d/t fibromyalgia   Chronic back pain    2012 tailbone surgery and 3 lower discs.    Closed fracture of distal end of right fibula and tibia    Depression    from Fibromyalgia diagnosis; not taking medicine. since 2001   Dizziness    rarely   Fibromyalgia    diagnosed 2001   GERD (gastroesophageal reflux disease)    Prilosec occasionally   Headache(784.0)    "sinus headaches"   History of hiatal hernia    Hypertension    since 2013   Hyperthyroidism    subclinical, no treatment; thyroid nodules   IBS (irritable bowel syndrome)    Impaired memory    states from fibromyalgia   Insomnia    takes Ambien   Jones fracture    left foot fifth metatarsal   Multiple allergies    including latex, pet dander, shellfish, pet dander   Painful orthopaedic hardware right ankle 05/27/2016   Seasonal allergies    Shortness of breath    Occasional with exertion;    Sore gums    this is why pt is on Amoxil-only takes for dental work   Tachycardia    Thyroid goiter    Varicose vein    protrudes above skin-per pt;vein popped and bruised;ultrasound done to make sure that there were no clots;noclots were found    Past Surgical History:  Past Surgical History:  Procedure Laterality Date   ANTERIOR CERVICAL DECOMP/DISCECTOMY FUSION N/A 12/08/2020   Procedure: Cervical Four-Five Anterior cervical decompression/discectomy/fusion;  Surgeon: Audie Bleacher, MD;  Location: [facility] OR;  Service: Neurosurgery;  Laterality: N/A;  anterior   ANTERIOR LUMBAR FUSION  09/20/2011   Procedure: ANTERIOR LUMBAR FUSION 1 LEVEL;  Surgeon: Pasty Bongo;  Location: [facility] NEURO ORS;  Service: Neurosurgery;  Laterality: N/A;  Lumbar five-Sacral One Anterior Lumbar Interbody Fusion /Dr. Early to Approach    APPLICATION OF WOUND VAC N/A 05/14/2022   Procedure: Removal of wound vac with closure of wound;  Surgeon: Audie Bleacher, MD;  Location: [facility] OR;  Service: Neurosurgery;  Laterality: N/A;  Pt to be admitted on 05-12-2022   CERVICAL DISC SURGERY     WITH TITANIUM PLATE IN NECK---LEFT SIDE   CERVICAL SPINE SURGERY  03/24/2019   CESAREAN SECTION     EYE SURGERY Bilateral 2023   cataract   fibroidectomy     FRACTURE SURGERY  05/08/2010   Jones fracture left foot fifth metatarsal   HARDWARE REMOVAL Right 05/28/2016   Procedure: RIGHT ANKLE HARDWARE REMOVAL;  Surgeon: Elly Habermann, MD;  Location: [facility];  Service: Orthopedics;  Laterality: Right;   HARDWARE REMOVAL N/A 07/28/2020   Procedure: Removal  of Lumbar Hardware;  Surgeon: Audie Bleacher, MD;  Location: [facility] OR;  Service: Neurosurgery;  Laterality: N/A;   HERNIA REPAIR     hiatal hernia   IR THORACIC DISC ASPIRATION W/IMG GUIDE  10/29/2023   IRRIGATION AND DEBRIDEMENT KNEE  04/09/2012   Procedure: IRRIGATION AND DEBRIDEMENT KNEE;  Surgeon: Forbes Ida., MD;  Location: [facility] OR;  Service: Orthopedics;  Laterality: Right;   JOINT REPLACEMENT  01/27/2012   left total knee and Right total knee   KNEE SURGERY  2005   Left knee arthroscopy   LAMINECTOMY WITH POSTERIOR LATERAL ARTHRODESIS LEVEL 4 Bilateral 03/22/2022   Procedure: Thoracic ten to Lumbar three  Posterior lateral arthrodesis with screws;  Surgeon: Audie Bleacher, MD;  Location: [facility] OR;  Service: Neurosurgery;  Laterality: Bilateral;   LUMBAR WOUND DEBRIDEMENT N/A 04/08/2022   Procedure: LUMBAR WOUND DEBRIDEMENT;  Surgeon: Audie Bleacher, MD;  Location: [facility] OR;  Service: Neurosurgery;  Laterality: N/A;   NASAL SEPTOPLASTY W/ TURBINOPLASTY  2007   due to recurrent sinusitis   ORIF ANKLE FRACTURE Right 06/21/2015   Procedure: OPEN REDUCTION INTERNAL FIXATION RIGHT DISTAL FIBULA  FRACTURE AND OPEN REDUCTION INTERNAL FIXATION SYNDESMOSIS ;  Surgeon: Elly Habermann, MD;  Location: [facility];  Service: Orthopedics;  Laterality: Right;   PARTIAL HYSTERECTOMY     SHOULDER ARTHROSCOPY W/ ROTATOR CUFF REPAIR Right    SHOULDER SURGERY Left    SPINE SURGERY  2004   Cervical plate, ACDF   STERIOD INJECTION  01/27/2012   Procedure: STEROID INJECTION;  Surgeon: Genevie Kerns, MD;  Location: [facility] OR;  Service: Orthopedics;  Laterality: Right;   TEE WITHOUT CARDIOVERSION N/A 03/01/2020   Procedure: TRANSESOPHAGEAL ECHOCARDIOGRAM (TEE);  Surgeon: Hugh Madura, MD;  Location: [facility] ENDOSCOPY;  Service: Cardiovascular;  Laterality: N/A;   THORACIC DISCECTOMY  02/16/2020   Procedure: Thoracic ten-eleven Discectomy;  Surgeon: Audie Bleacher, MD;  Location: [facility] OR;  Service: Neurosurgery;;   THORACIC DISCECTOMY N/A 01/08/2024   Procedure: THORACIC NINE-TEN LAMINECTOMY AND DECOMPRESSION;  Surgeon: Audie Bleacher, MD;  [facility];  Service: Neurosurgery;  Laterality: N/A;   TONSILLECTOMY  2007   TOTAL KNEE ARTHROPLASTY  01/27/2012   Procedure: TOTAL KNEE ARTHROPLASTY;  Surgeon: Genevie Kerns, MD;  Bilateral   TOTAL KNEE ARTHROPLASTY  04/06/2012   Procedure: TOTAL KNEE ARTHROPLASTY;  Surgeon: Genevie Kerns, MD;  Location: [facility] OR;  Service: Orthopedics;  Laterality: Right;   TUBAL LIGATION     UTERINE FIBROID SURGERY     mid 200s   WOUND EXPLORATION N/A 02/27/2020   Procedure: WOUND EXPLORATION;   Surgeon: Audie Bleacher, MD;  Location: [facility] OR;  Service: Neurosurgery;  Laterality: N/A;,  (wound vac upper back)   WOUND EXPLORATION N/A 03/30/2022   Procedure: THORACIC WOUND EXPLORATION WITH REPAIR OF CSF LEAK;  Surgeon: Audie Bleacher, MD;  Location: [facility] OR;  Service: Neurosurgery;  Laterality: N/A;    Assessment & Plan Clinical Impression: Patient is a 63 y.o. year old female with history of asthma, Anxiety d/o, chronic LBP, depression, Fibromyalgia, HTN,  obesity, MSSA hardware associated lumbar wound infection treated with IV antibiotics and antibiotic suppression 2021, thoracic wound drainage due to enterobacter and underwent lumbar wound debridement and repair of CSF leak 03/2022 delayed wound healing and prolonged course of antibiotics followed by Dr. Michale Age and Dr. Isadore Marble. She was found to have T9 discitis/osteomyelitis with epidural abscess/phlegmon and BLE weakness/numbness on Jan 2025 admission. She was discharged to home on IV cefepime  but had issues with occluded PICC requiring readmission 02/19-02/27 and with plans for extended antibiotics thru 01/05/24 followed by oral suppression. She was admitted antibiotics thru 12/30/23 with dislodged PICC, found to have severe hypokalemia K-2.7 which was supplemented and she was d/c to home with antibiotics extended to 01/19/24.    She was admitted on 01/08/24 for laminectomy with decompression of spinal stenosis T9-T10 by Dr Franky Macho.   Wound VAC to be removed today. ID recommends IV Cefepime while hospitalized followed by Cefadroxil/Cipro for at least a year. She lives alone and has limited mobility since Jan 2025 but was independent with cane prior to that. Neighbors/family  have been providing meals for couple of weeks PTA.  PT/OT has been working with patient who requires min to mod assist with mobility and min assist with ADLs. CIR recommended due to functional decline.   Patient currently requires mod with mobility secondary to muscle  weakness, decreased cardiorespiratoy endurance, and decreased standing balance and decreased balance strategies.  Prior to hospitalization, patient was modified independent  with mobility and lived with Alone in a House home.  Home access is 5Stairs to enter.  Patient will benefit from skilled PT intervention to maximize safe functional mobility, minimize fall risk, and decrease caregiver burden for planned discharge home with intermittent assist.  Anticipate patient will benefit from follow up [facility] at discharge.  PT - End of Session Activity Tolerance: Tolerates 30+ min activity with multiple rests Endurance Deficit: Yes Endurance Deficit Description: impaired PT Assessment Rehab Potential (ACUTE/IP ONLY): Fair PT Barriers to Discharge: Inaccessible home environment;Decreased caregiver support;Home environment access/layout;Incontinence;Wound Care;Lack of/limited family support;Weight PT Patient demonstrates impairments in the following area(s): Balance;Endurance;Edema;Motor;Sensory;Skin Integrity PT Transfers Functional Problem(s): Bed Mobility;Bed to Chair;Car;Furniture PT Locomotion Functional Problem(s): Ambulation;Stairs PT Plan PT Intensity: Minimum of 1-2 x/day ,45 to 90 minutes PT Frequency: 5 out of 7 days PT Duration Estimated Length of Stay: 2-3 weeks PT Treatment/Interventions: Ambulation/gait training;Community reintegration;DME/adaptive equipment instruction;Neuromuscular re-education;Psychosocial support;Stair training;UE/LE Strength taining/ROM;Wheelchair propulsion/positioning;Balance/vestibular training;Discharge planning;Pain management;Skin care/wound management;Therapeutic Activities;UE/LE Coordination activities;Cognitive remediation/compensation;Disease management/prevention;Patient/family education;Functional mobility training;Splinting/orthotics;Therapeutic Exercise PT Transfers Anticipated Outcome(s): mod I PT Locomotion Anticipated Outcome(s): mod I PT  Recommendation Follow Up Recommendations: Home health PT Patient destination: Home Equipment Recommended: To be determined Equipment Details: pt has SPC, RW, Rollator, shower chair, transfer tub bench   PT Evaluation Precautions/Restrictions Precautions Precautions: Fall;Back Precaution Booklet Issued: Yes (comment) Recall of Precautions/Restrictions: Impaired Precaution/Restrictions Comments: Spinal precautions-No back brace required. Required Braces or Orthoses: Other Brace Other Brace: Pt with personal air cast on R ankle. Pt reports ankle instability for ~10 years from previous ankle dislocation. Restrictions Weight Bearing Restrictions Per Provider Order: No Pain Interference Pain Interference Pain Effect on Sleep: 4. Almost constantly Pain Interference with Therapy Activities: 2. Occasionally Pain Interference with Day-to-Day Activities: 4. Almost constantly Home Living/Prior Functioning Home Living Living Arrangements: Alone Available Help at Discharge: Family;Friend(s);Available PRN/intermittently Type of Home: House Home Access: Stairs to enter [employer] of Steps: 5 Entrance Stairs-Rails: Right;Left;Can reach both Home Layout: One level Bathroom Shower/Tub: Tub/shower unit;Curtain (with grab bars, and transfer tub bench, and adjusted shower head) Bathroom Toilet: Standard (with grab bars and BSC that sits over toilet) Bathroom Accessibility: Yes Additional Comments: Prior to January pt limited communit ambulator with SPC/rollator.  Daily falls since January 2025 per pt report. EMS was called almost every day. Pt reports falls have occured when walking with RW/rollator to let dogs out. Pt reports no falls while completing stand pivot transfer bed to [facility]  Lives With: Alone Prior Function Level of Independence: Independent with transfers;Independent with gait;Independent with basic ADLs (prior to jan 2025. After january pt requiring assistance for ADLs. Pt  having daily falls with ambulation attemtps)  Able to Take Stairs?: Yes (prior to january) Driving: Yes (has not driven since new years eve) Vocation: On disability Vision/Perception  Vision - History Ability to See in Adequate Light: 0 Adequate  Cognition Overall Cognitive Status: Within Functional Limits for tasks assessed Arousal/Alertness: Awake/alert Orientation Level: Oriented X4 Year: 2025 Month: April Day of Week: Correct Memory: Appears intact Awareness: Appears intact Problem Solving: Appears intact Safety/Judgment: Appears intact Sensation Sensation Light Touch: Impaired Detail Light Touch Impaired Details: Impaired RUE;Impaired LUE;Impaired RLE;Impaired LLE Hot/Cold: Appears Intact Proprioception: Appears Intact Stereognosis: Appears Intact Additional Comments: nueropathy in B hands and feet, and decreased sensation in abdomen from surgery in early 2000s; pt able to detect light touch in all dermatomes, pt reports mild N&T in B LE, and constant N&T in B UE Motor  Motor Motor: Within Functional Limits  Trunk/Postural Assessment  Cervical Assessment Cervical Assessment: Within Functional Limits Postural Control Postural Control: Deficits on evaluation (body habitus, spinal precuaitons)  Balance Balance Balance Assessed: Yes Static Sitting Balance Static Sitting - Balance Support: No upper extremity supported;Feet supported Static Sitting - Level of Assistance: 5: Stand by assistance Dynamic Sitting Balance Dynamic Sitting - Balance Support: No upper extremity supported;During functional activity;Feet supported Dynamic Sitting - Level of Assistance: 5: Stand by assistance Static Standing Balance Static Standing - Balance Support: Bilateral upper extremity supported;During functional activity Static Standing - Level of Assistance: 5: Stand by assistance Dynamic Standing Balance Dynamic Standing - Balance Support: Bilateral upper extremity supported;During  functional activity Dynamic Standing - Level of Assistance: 4: Min assist Extremity Assessment  RLE Assessment RLE Assessment: Exceptions to [facility] RLE Strength Right Hip Flexion: 2/5 Right Knee Flexion: 2/5 Right Knee Extension: 3-/5 Right Ankle Dorsiflexion: 2+/5 LLE Assessment LLE Assessment: Exceptions to WFL LLE Strength LLE Overall Strength: Deficits LLE Overall Strength Comments: assessed in sitting position Left Hip Flexion: 3-/5 Left Knee Flexion: 3/5 Left Knee Extension: 3+/5 Left Ankle Dorsiflexion: 3+/5  Care Tool Care Tool Bed Mobility Roll left and right activity    Min A     Sit to lying activity    Mod A     Lying to sitting on side of bed activity    Mod A      Care Tool Transfers Sit to stand transfer    Min A     Chair/bed transfer    Min A     Car transfer    Safety/medical       Care Tool Locomotion Ambulation      Min A    Walk 10 feet activity    Min  A     Walk 50 feet with 2 turns activity    Safety/medical     Walk 150 feet activity    Safety/medical     Walk 10 feet on uneven surfaces activity    Safety/medical     Stairs    Safety/medical       Walk up/down 1 step activity    Safety/medical     Walk up/down 4 steps activity    Safety/medical     Walk up/down 12 steps activity    Safety/medical     Pick up small objects from floor    Safety/medical     Wheelchair      Dependent  Wheel 50 feet with 2 turns activity  Dependent     Wheel 150 feet activity    Dependent     Refer to Care Plan for Long Term Goals  SHORT TERM GOAL WEEK 1 PT Short Term Goal 1 (Week 1): pt will perform sit to stand with CGA with LRAD PT Short Term Goal 2 (Week 1): pt will perform bed to chair transfer with CGA and LRAD PT Short Term Goal 3 (Week 1): pt will perform bed mobility with LRAD and CGA PT Short Term Goal 4 (Week 1): pt will ambulate 25 feet with LRAD and CGA  Recommendations for other services: Neuropsych and  Therapeutic Recreation  Pet therapy and Stress management  Skilled Therapeutic Intervention Mobility Bed Mobility Bed Mobility: Supine to Sit;Rolling Right;Rolling Left;Sit to Supine Rolling Right: Minimal Assistance - Patient > 75% Rolling Left: Minimal Assistance - Patient > 75% Supine to Sit: Moderate Assistance - Patient 50-74% Sit to Supine: Moderate Assistance - Patient 50-74% Transfers Transfers: Stand to Sit;Sit to Stand;Stand Pivot Transfers Sit to Stand: Minimal Assistance - Patient > 75% Stand to Sit: Minimal Assistance - Patient > 75% Stand Pivot Transfers: Minimal Assistance - Patient > 75% Stand Pivot Transfer Details: Verbal cues for sequencing;Verbal cues for technique;Verbal cues for precautions/safety;Verbal cues for safe use of DME/AE Transfer (Assistive device): Rolling walker Locomotion  Gait Ambulation: Yes Gait Assistance: Minimal Assistance - Patient > 75% Gait Distance (Feet): 10 Feet Assistive device: Rolling walker Gait Assistance Details: Verbal cues for gait pattern;Verbal cues for safe use of DME/AE;Verbal cues for technique;Verbal cues for precautions/safety;Verbal cues for sequencing Gait Gait: Yes Gait Pattern: Impaired Gait Pattern: Step-to pattern;Decreased step length - right;Decreased step length - left;Decreased hip/knee flexion - left;Decreased dorsiflexion - right Gait velocity: decreased Stairs / Additional Locomotion Stairs: No Wheelchair Mobility Wheelchair Mobility: Yes Wheelchair Assistance: Dependent - Patient 0%   Discharge Criteria: Patient will be discharged from PT if patient refuses treatment 3 consecutive times without medical reason, if treatment goals not met, if there is a change in medical status, if patient makes no progress towards goals or if patient is discharged from hospital.  The above assessment, treatment plan, treatment alternatives and goals were discussed and mutually agreed upon: by patient  Today's  Interventions  Evaluation completed (see details above and below) with education on PT POC and goals and individual treatment initiated with focus on implementation of spinal precautions with mobility.   Bed mobility: min A for rolling, mod A for sit to supine with verbal and tactile cues for implementation of log roll technique.   Sit to stand/stand pivot transfer  with RW and min A, verbal cues provided for anterior weight shift, and for R LE positioning 2/2 proprioceptive deficits.   Gait x10 feet with RW and min A, verbal cues provided for R LE positioning.   Discussed home entry, pt reports her son has mentioned getting a ramp. Handout provided for how to build a ramp. Pt asking about temporary metal ramp. Education provided for option to purchase custom metal ramp with [employer] 2/2 5 step entry. Recommended pt begin discussion with family and [employer] now to ensure it is ready by D/C as custom ramp takes increased time. Pt verbalized understanding and agreeable.   Handout provided for home measurement sheet. Pt reports a friend has given her a WC however it does not fit in doorways of pt house.   Discussed car options: pt reports her only option at this time  is a Environmental health practitioner with running board entry.   IV team present to remove PICC line at start of session.   Pt reports need to use bathroom during session, however pt unable to urinate upon transfer to [facility] despite increased time.   Pt supine in bed at end of session with all needs within reach and bed alarm on.      [facility] [place], Merrick, DPT  01/20/2024, 10:20 AM

## 2024-01-20 NOTE — Progress Notes (Signed)
 Met with patient to review current situation, team conferences and plan of care. Reviewed medications, CPAP, incision care, pain, sleep, bowel , bladder  and wound vac. Continue to follow along to provide educational needs to facilitate preparation for discharge.

## 2024-01-21 DIAGNOSIS — M4714 Other spondylosis with myelopathy, thoracic region: Secondary | ICD-10-CM | POA: Diagnosis not present

## 2024-01-21 DIAGNOSIS — K592 Neurogenic bowel, not elsewhere classified: Secondary | ICD-10-CM | POA: Diagnosis not present

## 2024-01-21 DIAGNOSIS — M4644 Discitis, unspecified, thoracic region: Secondary | ICD-10-CM | POA: Diagnosis not present

## 2024-01-21 DIAGNOSIS — N319 Neuromuscular dysfunction of bladder, unspecified: Secondary | ICD-10-CM | POA: Diagnosis not present

## 2024-01-21 NOTE — Progress Notes (Signed)
 Physical Therapy Session Note  Patient Details  Name: [name] MRN: [id number] Date of Birth: [birth year]  Today's Date: 01/21/2024 PT Individual Time: 1105-1205 and 1415-1530 PT Individual Time Calculation (min): 60 min   Short Term Goals: Week 1:  PT Short Term Goal 1 (Week 1): pt will perform sit to stand with CGA with LRAD PT Short Term Goal 2 (Week 1): pt will perform bed to chair transfer with CGA and LRAD PT Short Term Goal 3 (Week 1): pt will perform bed mobility with LRAD and CGA PT Short Term Goal 4 (Week 1): pt will ambulate 25 feet with LRAD and CGA  Skilled Therapeutic Interventions/Progress Updates: Tx1: Pt presented in recliner agreeable to therapy. Pt states 8/10 pain primarily at incision site, premedicated with rest and repositioning provided during session. Pt required increased time throughout session for activities. Pt completed sit to stand from recliner with light minA and increased time. Pt noted to perform significant flexion to complete power up and then straighten up to reach RW. Pt educated on scooting forward and decreasing forward bend which pt was able to improve throughout session. Pt transported to day room for time management. Completed Sit to stand with minA and improved posture and ambulated ~35ft with RW and light minA. Pt was able to consistently clear RLE and was able to adjust foot placement without cues. Pt then transported to high/low mat and completed ambulatory transfer with light minA. Pt then worked on blocked practice of Sit to stand form elevated mat however with decreasing UE support. Due to elevated mat pt completed Sit to stand with CGA. Completed stand step transfer back to w/c in same manner as prior and transported back to room. Pt completed ambulatory transfer from w/c to recliner with light minA and RW. Pt left in recliner at end of session with call bell within reach and needs met.   Tx2: Pt presented in recliner agreeable to therapy. Pt  states pain 8/10 at start of session increased to 10/10 during session with pain meds received at end of session. Rest breaks provided throughout session for pain management. Pt requesting to use bathroom due to urinary incontinence however pt with incontinent episode by time reached toilet. Performed Sit to stand with light minA and ambulated ~1ft to toilet with RW and light minA. Pt was able to safety navigate RW and clear entry to bathroom without additional assist. Pt was able to complete clothing management with CGA, pt required minA once on toilet to don new brief then required total A for peri-care once in standing. Required modA for clothing management due to brief/pants falling below knees. Once over hips pt was able to stand with RLE against toilet for support but used both hands to adjust clothing. Pt then ambulated ~37ft to w/c and completed hand hygiene at sink at w/c level. Pt then take to [facility] entrance for change of environment and pt participated in seated LE therex for general strengthening. Pt performed LAQ with 3lb weights 2 x 10, hip flexion AROM 2 x 10, ankle pumps to fatigue. Performed Sit to stand with minA and standing marches x 10. Pt transported back to room and completed ambulatory transfer to bed with RW and minA. At EOB required total A for doffing shoes and modA for sit to supine. Pt was able to use headboard to pull self up to top of bed. Pt repositioned to comfort and left in bed with bed alarm on, call bell within reach and needs met.  Therapy Documentation Precautions:  Precautions Precautions: Fall, Back Precaution Booklet Issued: Yes (comment) Recall of Precautions/Restrictions: Impaired Precaution/Restrictions Comments: Spinal precautions-No back brace required. Required Braces or Orthoses: Other Brace Other Brace: Pt with personal air cast on R ankle. Pt reports ankle instability for ~10 years from previous ankle dislocation. Restrictions Weight Bearing  Restrictions Per Provider Order: No General:   Vital Signs: Therapy Vitals Temp: 98.1 F (36.7 C) Pulse Rate: 67 Resp: 16 BP: (!) 150/88 Patient Position (if appropriate): Sitting Oxygen Therapy SpO2: 97 % O2 Device: Room Air Pain: Pain Assessment Pain Scale: 0-10 Pain Score: 10-Worst pain ever Pain Location: Back Pain Intervention(s): Medication (See eMAR)   Therapy/Group: Individual Therapy  Yaasir Menken 01/21/2024, 4:30 PM

## 2024-01-21 NOTE — Progress Notes (Signed)
 Occupational Therapy Session Note  Patient Details  Name: [name] MRN: [id number] Date of Birth: 08-14-1961  Today's Date: 01/21/2024 OT Individual Time: 4098-1191 OT Individual Time Calculation (min): 75 min    Short Term Goals: Week 1:  OT Short Term Goal 1 (Week 1): Pt will complete LB bathing and dressing with Max A and use of AE. OT Short Term Goal 2 (Week 1): Pt will completed toileting with Max A  Skilled Therapeutic Interventions/Progress Updates:    Pt resting in bed upon arrival with nursing staff present. OT intervention with focus on bed mobility, sit<>stand, tranfsers, toileting, dressing, safety awareness, and discharge planning to increase pt's independence with BADLs. Supine>sit EOB with min A (HOB elevated) and use of bed rails. Max verbal cues for technique to adhere to back precautions. Pt reports she has bed rails at home. Sit<>stand and tranfser to w/c with min A. Toilet transfer using grab bar with min A. Max A for toileting. Dressing with sit<>stand from w/c at sink. Pt used reacher and sock aid to assist with LB dressing. Pt reports she has equipment at home. Pt also reports she has BSC and her nephew is purchasing a TTB. Pt required assistance donning RLE air splint and shoes. Pt amb 5' with min A to access recliner. Pt remained in recliner with all needs within reach. Pt requires more then a reasonable amount of time to complete tasks with min verbal cues (overall) to adhere to back precautions.   Therapy Documentation Precautions:  Precautions Precautions: Fall, Back Precaution Booklet Issued: Yes (comment) Recall of Precautions/Restrictions: Impaired Precaution/Restrictions Comments: Spinal precautions-No back brace required. Required Braces or Orthoses: Other Brace Other Brace: Pt with personal air cast on R ankle. Pt reports ankle instability for ~10 years from previous ankle dislocation. Restrictions Weight Bearing Restrictions Per Provider Order:  No Pain: Pt reports chronic pain and back pain (unrated); meds being admin by nursing during session  Therapy/Group: Individual Therapy  Doak Free 01/21/2024, 10:37 AM

## 2024-01-21 NOTE — Progress Notes (Signed)
 PROGRESS NOTE   Subjective/Complaints: Pt slept well after all of her activity yesterday. Really hadn't slept in 2 nights prior. Feels well today. A little sore  ROS: Patient denies fever, rash, sore throat, blurred vision, dizziness, nausea, vomiting, diarrhea, cough, shortness of breath or chest pain,   headache, or mood change.    Objective:   No results found. Recent Labs    01/20/24 0533  WBC 10.9*  HGB 10.4*  HCT 33.3*  PLT 395   Recent Labs    01/20/24 0533  NA 140  K 3.8  CL 102  CO2 29  GLUCOSE 94  BUN 20  CREATININE 0.80  CALCIUM 9.2    Intake/Output Summary (Last 24 hours) at 01/21/2024 0831 Last data filed at 01/21/2024 0805 Gross per 24 hour  Intake 880 ml  Output --  Net 880 ml        Physical Exam: Vital Signs Blood pressure (!) 146/73, pulse 72, temperature 98 F (36.7 C), temperature source Oral, resp. rate 20, height 5\' 2"  (1.575 m), weight 97.4 kg, last menstrual period 11/20/2012, SpO2 100%.  Constitutional: No distress . Vital signs reviewed. HEENT: NCAT, EOMI, oral membranes moist Neck: supple Cardiovascular: RRR without murmur. No JVD    Respiratory/Chest: CTA Bilaterally without wheezes or rales. Normal effort    GI/Abdomen: BS +, non-tender, non-distended Ext: no clubbing, cyanosis, or edema Psych: pleasant and cooperative  Skin: Back incision appears clean,dry. Some puckering of skin due weight, prior surgeries. Neuro:  Alert and oriented x 3. Normal insight and awareness. Intact Memory. Normal language and speech. Cranial nerve exam unremarkable. MMT: RUE 4+ to 5/5 and LUE 4-/5 to 4+/5 prox to distal, both still limited by shoulder pain/DJD. RLE 2/5 prox to 2/5 ADF/PF limited by right ankle. LLE 3-/5 HF, KE and 3+ADF/PF. Decreased LT below the umbilicus but senses general touch and pain.. DTR's 1+ Musculoskeletal: DJD/limited ROM both shoulders L>>R. Right ankle with limited  ADF/PF, chronic changes noted.    LB/midback still tender.    Assessment/Plan: 1. Functional deficits which require 3+ hours per day of interdisciplinary therapy in a comprehensive inpatient rehab setting. Physiatrist is providing close team supervision and 24 hour management of active medical problems listed below. Physiatrist and rehab team continue to assess barriers to discharge/monitor patient progress toward functional and medical goals  Care Tool:  Bathing        Body parts bathed by helper: Right arm, Left arm, Chest, Abdomen, Front perineal area, Buttocks, Right upper leg, Left upper leg, Face Body parts n/a: Right lower leg, Left lower leg   Bathing assist Assist Level: Minimal Assistance - Patient > 75%     Upper Body Dressing/Undressing Upper body dressing        Upper body assist Assist Level: Supervision/Verbal cueing    Lower Body Dressing/Undressing Lower body dressing      What is the patient wearing?: Pants, Incontinence brief     Lower body assist Assist for lower body dressing: Maximal Assistance - Patient 25 - 49%     Toileting Toileting    Toileting assist Assist for toileting: Total Assistance - Patient < 25%     Transfers  Chair/bed transfer  Transfers assist     Chair/bed transfer assist level: Minimal Assistance - Patient > 75%     Locomotion Ambulation   Ambulation assist      Assist level: Minimal Assistance - Patient > 75% Assistive device: Walker-rolling Max distance: 10 feet   Walk 10 feet activity   Assist     Assist level: Minimal Assistance - Patient > 75% Assistive device: Walker-rolling   Walk 50 feet activity   Assist Walk 50 feet with 2 turns activity did not occur: Safety/medical concerns         Walk 150 feet activity   Assist Walk 150 feet activity did not occur: Safety/medical concerns         Walk 10 feet on uneven surface  activity   Assist Walk 10 feet on uneven surfaces activity  did not occur: Safety/medical concerns         Wheelchair     Assist Is the patient using a wheelchair?: Yes Type of Wheelchair: Manual    Wheelchair assist level: Dependent - Patient 0%      Wheelchair 50 feet with 2 turns activity    Assist        Assist Level: Dependent - Patient 0%   Wheelchair 150 feet activity     Assist      Assist Level: Dependent - Patient 0%   Blood pressure (!) 146/73, pulse 72, temperature 98 F (36.7 C), temperature source Oral, resp. rate 20, height 5\' 2"  (1.575 m), weight 97.4 kg, last menstrual period 11/20/2012, SpO2 100%.  Medical Problem List and Plan: 1. Functional deficits secondary to T9/10 myelopathy d/t severe spinal stenosis  and hx of T9 discitis/osteomyelitis             -patient may  shower--pat wound dry after shower.              -ELOS/Goals: 14-21 days, goals are mod I to supervision/min assist with PT and OT  -Continue CIR therapies including PT, OT  -[facility] bringing in R AFO  2.  Antithrombotics: -DVT/anticoagulation:  Pharmaceutical: Lovenox             -antiplatelet therapy: N/A 3. Chronic Pain/Pain Management: Followed by [facility] on [address]/Dr. Tyrone [facility]) --Gabapentin 1200 mg TID. Tylenol prn.Parafon 500 mg qid.              --oxycodone prn. Oxycontin 10 mg bid.  4/16 pain  seems to be under control 4. Mood/Behavior/Sleep: LCSW to follow for evaluation and support.              -antipsychotic agents: N/A.              --Has insomnia does not use CPAP "forgets to use it"/Uses valium for insomnia? Panic attacks? --Melatonin prn added-pt hasn't used yet -slept very well last night. Hopeful that increased activity during day will help normalize her day/night cycle 5. Neuropsych/cognition: This patient is capable of making decisions on her own behalf. 6. Skin/Wound Care: vac now dc'ed.  - continue dry dressing (ABD pad) to wound for now. 7. Fluids/Electrolytes/Nutrition:  Monitor I/O.               --Vitamin supplement and prostat to help promote wound healing.    -intake reasonable so far.  8. Enterobacter/MSSA Lumbar infection: Continue Cefepime while hospitalized and transition to Cipro 500 mg bid/Cefadroxil 1 gram bid at discharge.             --  benadryl 50 mg tid prn allergies-->has been using couple of times a dayu             --follow up with Dr. Zelda Hickman  4/15 WBC's sl up to 10.9, monitor for trend, afebrile. Wound looks good '  -recheck labs Monday unless there is a reason otherwise 9. Recurrent Hypokalemia:  -4/15 today's level up to 3.8              --continue daily supplement   10.H/o IBS- C: Was not taking iron at home--will d/c.             --Black stools due to iron supplement.  -- Continue Amitiza and order stool guaiacs.  -there is also likely a component of neurogenic bowel as patient was having incontinence PTA (2 years) 4/16 pt had an incontinent bowel movement last night x2.   -consider bowel program 11. HTN: Monitor BP TID --on Amlodipine, tenormin and Chlorthalidone. --likely need potassium supplement due to chlorthalidone.      13. Neurogenic Bladder/ incontinence/frequency: Has been incontinent of bladder at nights and during the day.  -4/16 still no pvr's scanned, I/O cath for vol >350cc -ongoing intermittent incontinence   14. OSA: Uses CPAP at home-- resume.     LOS: 2 days A FACE TO FACE EVALUATION WAS PERFORMED  Rawland Caddy 01/21/2024, 8:31 AM

## 2024-01-21 NOTE — Care Management (Signed)
 Inpatient Rehabilitation Center Individual Statement of Services  Patient Name:  [name]  Date:  01/21/2024  Welcome to the Inpatient Rehabilitation Center.  Our goal is to provide you with an individualized program based on your diagnosis and situation, designed to meet your specific needs.  With this comprehensive rehabilitation program, you will be expected to participate in at least 3 hours of rehabilitation therapies Monday-Friday, with modified therapy programming on the weekends.  Your rehabilitation program will include the following services:  Physical Therapy (PT), Occupational Therapy (OT), 24 hour per day rehabilitation nursing, Therapeutic Recreaction (TR), Psychology, Neuropsychology, Care Coordinator, Rehabilitation Medicine, Nutrition Services, Pharmacy Services, and Other  Weekly team conferences will be held on Tuesdays to discuss your progress.  Your Inpatient Rehabilitation Care Coordinator will talk with you frequently to get your input and to update you on team discussions.  Team conferences with you and your family in attendance may also be held.  Expected length of stay: 12-21 days    Overall anticipated outcome: Independent with Assistive Device  Depending on your progress and recovery, your program may change. Your Inpatient Rehabilitation Care Coordinator will coordinate services and will keep you informed of any changes. Your Inpatient Rehabilitation Care Coordinator's name and contact numbers are listed  below.  The following services may also be recommended but are not provided by the Inpatient Rehabilitation Center:  Driving Evaluations Home Health Rehabiltiation Services Outpatient Rehabilitation Services Vocational Rehabilitation   Arrangements will be made to provide these services after discharge if needed.  Arrangements include referral to agencies that provide these services.  Your insurance has been verified to be:  [facility]  Your primary  doctor is:  Chartered certified accountant  Pertinent information will be shared with your doctor and your insurance company.  Inpatient Rehabilitation Care Coordinator:  Kathey Pang [phone] or [phone]  Information discussed with and copy given to patient by: Rennis Case, 01/21/2024, 10:41 AM

## 2024-01-21 NOTE — Patient Care Conference (Signed)
 Inpatient RehabilitationTeam Conference and Plan of Care Update Date: 01/20/2024   Time: 1124 am    Patient Name: [name]      Medical Record Number: [id number]  Date of Birth: 03-25-1961 Sex: Female         Room/Bed: 4W25C/4W25C-01 Payor Info: Payor: [employer] MEDICARE / Plan: Suan Halter DUAL COMPLETE / Product Type: *No Product type* /    Admit Date/Time:  01/19/2024  5:16 PM  Primary Diagnosis:  Thoracic discitis  Hospital Problems: Principal Problem:   Thoracic discitis Active Problems:   Thoracic myelopathy   Neurogenic bowel   Neurogenic bladder    Expected Discharge Date: Expected Discharge Date: 02/03/24 (TBD)  Team Members Present: Physician leading conference: Dr. Faith Rogue Social Worker Present: Cecile Sheerer, LCSWA Nurse Present: Konrad Dolores, RN PT Present: Bernie Covey, PT OT Present: Roney Mans, OT;Ardis Rowan, COTA PPS Coordinator present : Fae Pippin, SLP     Current Status/Progress Goal Weekly Team Focus  Bowel/Bladder   pt incontinent of b/b   regain continence   Assist with time toileting q2-q4    Swallow/Nutrition/ Hydration               ADL's   Min A step pivot transfer with RW. UB ADL Min A, LB ADL Total A   Mod I with HHOT. No DME needs.   Independence with back precautions, AE use during ADL tasks, functional transfers.    Mobility   eval pending   eval pending  eval pending    Communication                Safety/Cognition/ Behavioral Observations               Pain   pt c/o back pain 10/10, scheduled meds and prn meds given   <3 pain score   Asesss qshift and prn    Skin   Skin intact, surgical incision lower back, wound vac   Maintain skin integrity  Assess qshift and prn      Discharge Planning:  TBA. Per EMR, pt will d/c to home alone with PRN asst for IADLs, and will need otbe MOD I at d/c. SW will confirm there are no barriers to discharge.    Team  Discussion: Patient was admitted post T9/10 myelopathy d/t severe spinal stenosis and hx of T9 discitis/osteomyelitis. Patient limited by pain, lower extremities edema , insomnia and obstructive sleep apnea.  Patient on target to meet rehab goals: yes, evals pending  *See Care Plan and progress notes for long and short-term goals.   Revisions to Treatment Plan:  N/a   Teaching Needs: Safety, transfers, toileting, medications, dietary modifications, etc.    Current Barriers to Discharge: Decreased caregiver support, IV antibiotics, Neurogenic bowel and bladder, and Weight  Possible Resolutions to Barriers:  Family education     Medical Summary Current Status: admitted for T9-10 myelopathy. neurogenic bowel and bladder. pain issues. insomnia, osa. old right ankle issue  Barriers to Discharge: Medical stability;Uncontrolled Pain   Possible Resolutions to [employer]: daily assessment of labs and pt data, pain mg, orthotic for right ankle.   Continued Need for Acute Rehabilitation Level of Care: The patient requires daily medical management by a physician with specialized training in physical medicine and rehabilitation for the following reasons: Direction of a multidisciplinary physical rehabilitation program to maximize functional independence : Yes Medical management of patient stability for increased activity during participation in an intensive rehabilitation regime.: Yes Analysis of laboratory  values and/or radiology reports with any subsequent need for medication adjustment and/or medical intervention. : Yes   I attest that I was present, lead the team conference, and concur with the assessment and plan of the team.   Jerene Monks 01/20/2024, 1124 am

## 2024-01-21 NOTE — Progress Notes (Signed)
 Inpatient Rehabilitation Care Coordinator Assessment and Plan Patient Details  Name: [name] MRN: [id number] Date of Birth: 1961-09-17  Today's Date: 01/21/2024  Hospital Problems: Principal Problem:   Thoracic discitis Active Problems:   Thoracic myelopathy   Neurogenic bowel   Neurogenic bladder  Past Medical History:  Past Medical History:  Diagnosis Date   Anemia    Ankle syndesmosis disruption    Anxiety    takes Ativan and Valium, after mother passed   Arthritis    bilateral knees s/p knee replacement bilaterally   Asthma    Bronchitis    Bruising    pt states unexplained d/t fibromyalgia   Chronic back pain    2012 tailbone surgery and 3 lower discs.    Closed fracture of distal end of right fibula and tibia    Depression    from Fibromyalgia diagnosis; not taking medicine. since 2001   Dizziness    rarely   Fibromyalgia    diagnosed 2001   GERD (gastroesophageal reflux disease)    Prilosec occasionally   Headache(784.0)    "sinus headaches"   History of hiatal hernia    Hypertension    since 2013   Hyperthyroidism    subclinical, no treatment; thyroid nodules   IBS (irritable bowel syndrome)    Impaired memory    states from fibromyalgia   Insomnia    takes Ambien   Jones fracture    left foot fifth metatarsal   Multiple allergies    including latex, pet dander, shellfish, pet dander   Painful orthopaedic hardware right ankle 05/27/2016   Seasonal allergies    Shortness of breath    Occasional with exertion;    Sore gums    this is why pt is on Amoxil-only takes for dental work   Tachycardia    Thyroid goiter    Varicose vein    protrudes above skin-per pt;vein popped and bruised;ultrasound done to make sure that there were no clots;noclots were found   Past Surgical History:  Past Surgical History:  Procedure Laterality Date   ANTERIOR CERVICAL DECOMP/DISCECTOMY FUSION N/A 12/08/2020   Procedure: Cervical Four-Five Anterior  cervical decompression/discectomy/fusion;  Surgeon: Coletta Memos, MD;  Location: [facility] OR;  Service: Neurosurgery;  Laterality: N/A;  anterior   ANTERIOR LUMBAR FUSION  09/20/2011   Procedure: ANTERIOR LUMBAR FUSION 1 LEVEL;  Surgeon: Carmela Hurt;  Location: [facility];  Service: Neurosurgery;  Laterality: N/A;  Lumbar five-Sacral One Anterior Lumbar Interbody Fusion /Dr. Early to Approach    APPLICATION OF WOUND VAC N/A 05/14/2022   Procedure: Removal of wound vac with closure of wound;  Surgeon: Coletta Memos, MD;  Location: [facility] OR;  Service: Neurosurgery;  Laterality: N/A;  Pt to be admitted on 05-12-2022   CERVICAL DISC SURGERY     WITH TITANIUM PLATE IN NECK---LEFT SIDE   CERVICAL SPINE SURGERY  03/24/2019   CESAREAN SECTION     EYE SURGERY Bilateral 2023   cataract   fibroidectomy     FRACTURE SURGERY  05/08/2010   Jones fracture left foot fifth metatarsal   HARDWARE REMOVAL Right 05/28/2016   Procedure: RIGHT ANKLE HARDWARE REMOVAL;  Surgeon: Salvatore Marvel, MD;  Location: [facility];  Service: Orthopedics;  Laterality: Right;   HARDWARE REMOVAL N/A 07/28/2020   Procedure: Removal of Lumbar Hardware;  Surgeon: Coletta Memos, MD;  Location: [facility] OR;  Service: Neurosurgery;  Laterality: N/A;   HERNIA REPAIR     hiatal hernia  IR THORACIC DISC ASPIRATION W/IMG GUIDE  10/29/2023   IRRIGATION AND DEBRIDEMENT KNEE  04/09/2012   Procedure: IRRIGATION AND DEBRIDEMENT KNEE;  Surgeon: Forbes Ida., MD;  Location: [facility] OR;  Service: Orthopedics;  Laterality: Right;   JOINT REPLACEMENT  01/27/2012   left total knee and Right total knee   KNEE SURGERY  2005   Left knee arthroscopy   LAMINECTOMY WITH POSTERIOR LATERAL ARTHRODESIS LEVEL 4 Bilateral 03/22/2022   Procedure: Thoracic ten to Lumbar three Posterior lateral arthrodesis with screws;  Surgeon: Audie Bleacher, MD;  Location: [facility] OR;  Service: Neurosurgery;  Laterality: Bilateral;   LUMBAR WOUND DEBRIDEMENT N/A 04/08/2022    Procedure: LUMBAR WOUND DEBRIDEMENT;  Surgeon: Audie Bleacher, MD;  Location: [facility] OR;  Service: Neurosurgery;  Laterality: N/A;   NASAL SEPTOPLASTY W/ TURBINOPLASTY  2007   due to recurrent sinusitis   ORIF ANKLE FRACTURE Right 06/21/2015   Procedure: OPEN REDUCTION INTERNAL FIXATION RIGHT DISTAL FIBULA  FRACTURE AND OPEN REDUCTION INTERNAL FIXATION SYNDESMOSIS ;  Surgeon: Elly Habermann, MD;  Location: [facility];  Service: Orthopedics;  Laterality: Right;   PARTIAL HYSTERECTOMY     SHOULDER ARTHROSCOPY W/ ROTATOR CUFF REPAIR Right    SHOULDER SURGERY Left    SPINE SURGERY  2004   Cervical plate, ACDF   STERIOD INJECTION  01/27/2012   Procedure: STEROID INJECTION;  Surgeon: Genevie Kerns, MD;  Location: [facility] OR;  Service: Orthopedics;  Laterality: Right;   TEE WITHOUT CARDIOVERSION N/A 03/01/2020   Procedure: TRANSESOPHAGEAL ECHOCARDIOGRAM (TEE);  Surgeon: Hugh Madura, MD;  Location: [name] ENDOSCOPY;  Service: Cardiovascular;  Laterality: N/A;   THORACIC DISCECTOMY  02/16/2020   Procedure: Thoracic ten-eleven Discectomy;  Surgeon: Audie Bleacher, MD;  Location: [facility] OR;  Service: Neurosurgery;;   THORACIC DISCECTOMY N/A 01/08/2024   Procedure: THORACIC NINE-TEN LAMINECTOMY AND DECOMPRESSION;  Surgeon: Audie Bleacher, MD;  Location: [facility];  Service: Neurosurgery;  Laterality: N/A;   TONSILLECTOMY  2007   TOTAL KNEE ARTHROPLASTY  01/27/2012   Procedure: TOTAL KNEE ARTHROPLASTY;  Surgeon: Genevie Kerns, MD;  Bilateral   TOTAL KNEE ARTHROPLASTY  04/06/2012   Procedure: TOTAL KNEE ARTHROPLASTY;  Surgeon: Genevie Kerns, MD;  Location: [facility] OR;  Service: Orthopedics;  Laterality: Right;   TUBAL LIGATION     UTERINE FIBROID SURGERY     mid 200s   WOUND EXPLORATION N/A 02/27/2020   Procedure: WOUND EXPLORATION;  Surgeon: Audie Bleacher, MD;  Location: [facility] OR;  Service: Neurosurgery;  Laterality: N/A;,  (wound vac upper back)   WOUND EXPLORATION N/A 03/30/2022   Procedure: THORACIC WOUND  EXPLORATION WITH REPAIR OF CSF LEAK;  Surgeon: Audie Bleacher, MD;  Location: [facility] OR;  Service: Neurosurgery;  Laterality: N/A;   Social History:  reports that she quit smoking about 25 years ago. Her smoking use included cigarettes. She started smoking about 35 years ago. She has a 5 pack-year smoking history. She has never used smokeless tobacco. She reports that she does not drink alcohol and does not use drugs.  Family / Support Systems Marital Status: Divorced How Long?: since 1998 Patient Roles: Parent Spouse/Significant Other: Divorced Children: Psychologist, clinical (son); lives in [place] Other Supports: none reported Anticipated Caregiver: N/A Ability/Limitations of Caregiver: Pt lives alone and does not have any support. long discussion about how she will have support once she goes home. Pt reports previous SNF admission [facility] and stayed for 20 days. Caregiver Availability:  (PRN) Family Dynamics: Pt lives alone. Pt will  be using transportation offered through her insurnace plan. She is unsure onthe number of trips she has available.  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 [employer] until she had to stop working in 2012 Education: college grad Primary school teacher - How often do you need to have someone help you when you read instructions, pamphlets, or other written material from your doctor or pharmacy?: Never Writes: Yes Employment Status: Disabled Date Retired/Disabled/Unemployed: 2012 Marine scientist Issues: Denies Guardian/Conservator: N/A   Abuse/Neglect Abuse/Neglect Assessment Can Be Completed: Yes Physical Abuse: Denies Verbal Abuse: Denies Sexual Abuse: Denies Exploitation of patient/patient's resources: Denies Self-Neglect: Denies  Patient response to: Social Isolation - How often do you feel lonely or isolated from those  around you?: Never  Emotional Status Pt's affect, behavior and adjustment status: Pt in good spirits at time of visit Recent Psychosocial Issues: Denies Psychiatric History: Pt admits that she gets valium precsribed by pain management clinic- [facility]; once a month sees NP- rachel MCCoy; every 90 dys sess MD- Mignon Alberts. Substance Abuse History: Denies  Patient / Family Perceptions, Expectations & Goals Pt/Family understanding of illness & functional limitations: Pt and son have a general understanding of care needs Premorbid pt/family roles/activities: Independent with PRN support from friends Anticipated changes in roles/activities/participation: Assistance with ADLs/IADLs Pt/family expectations/goals: Pt goal is to work on being as independent as possible.  Community Resources [employer]: None Premorbid Home Care/DME Agencies: None Transportation available at discharge: TBD Is the patient able to respond to transportation needs?: Yes In the past 12 months, has lack of transportation kept you from medical appointments or from getting medications?: No In the past 12 months, has lack of transportation kept you from meetings, work, or from getting things needed for daily living?: No Resource referrals recommended: Neuropsychology  Discharge Planning Living Arrangements: Alone Support Systems: Other relatives, Friends/neighbors Type of Residence: Private residence Insurance Resources: Media planner (specify) ([employer] Medicare) Surveyor, quantity Resources: SSD Financial Screen Referred: No Living Expenses: Psychologist, sport and exercise Management: Patient Does the patient have any problems obtaining your medications?: No Home Management: Pt managed all homecare needs Patient/Family Preliminary Plans: TBD Care Coordinator Barriers to Discharge: Decreased caregiver support, Insurance for SNF coverage, Lack of/limited family support Care Coordinator Anticipated Follow Up Needs:  HH/OP Expected length of stay: 14-21 days  Clinical Impression SW met with pt in room to introduce self, explain role, and discuss discharge process. Pt aware d/c date is likely 4/29 and will be updated if this changes. DME- 3 canes, 2 Rws, 2 rollators, shower chair ?TTB, and grab bars in bathroom and guest bathroom as well as hand held shower head. She intends to order an upright rollator with platform attachments. Pt and son there will be more updates next week.   Chart review indicates [facility]. SW will confirm.   Duwan Adrian A Tashika Goodin 01/21/2024, 4:19 PM

## 2024-01-22 DIAGNOSIS — M4644 Discitis, unspecified, thoracic region: Secondary | ICD-10-CM | POA: Diagnosis not present

## 2024-01-22 DIAGNOSIS — N319 Neuromuscular dysfunction of bladder, unspecified: Secondary | ICD-10-CM | POA: Diagnosis not present

## 2024-01-22 DIAGNOSIS — M4714 Other spondylosis with myelopathy, thoracic region: Secondary | ICD-10-CM | POA: Diagnosis not present

## 2024-01-22 DIAGNOSIS — K592 Neurogenic bowel, not elsewhere classified: Secondary | ICD-10-CM | POA: Diagnosis not present

## 2024-01-22 LAB — CBC
HCT: 33.7 % — ABNORMAL LOW (ref 36.0–46.0)
Hemoglobin: 10.3 g/dL — ABNORMAL LOW (ref 12.0–15.0)
MCH: 25.4 pg — ABNORMAL LOW (ref 26.0–34.0)
MCHC: 30.6 g/dL (ref 30.0–36.0)
MCV: 83 fL (ref 80.0–100.0)
Platelets: 348 10*3/uL (ref 150–400)
RBC: 4.06 MIL/uL (ref 3.87–5.11)
RDW: 14.6 % (ref 11.5–15.5)
WBC: 12 10*3/uL — ABNORMAL HIGH (ref 4.0–10.5)
nRBC: 0 % (ref 0.0–0.2)

## 2024-01-22 LAB — BASIC METABOLIC PANEL WITH GFR
Anion gap: 13 (ref 5–15)
BUN: 18 mg/dL (ref 8–23)
CO2: 28 mmol/L (ref 22–32)
Calcium: 9.1 mg/dL (ref 8.9–10.3)
Chloride: 100 mmol/L (ref 98–111)
Creatinine, Ser: 0.78 mg/dL (ref 0.44–1.00)
GFR, Estimated: >60 mL/min (ref >60)
Glucose, Bld: 84 mg/dL (ref 70–99)
Potassium: 3.8 mmol/L (ref 3.5–5.1)
Sodium: 141 mmol/L (ref 135–145)

## 2024-01-22 MED ORDER — BISACODYL 10 MG RE SUPP
10.0000 mg | Freq: Every day | RECTAL | Status: DC
  Filled 2024-01-22 (×2): qty 1

## 2024-01-22 MED ORDER — SENNOSIDES-DOCUSATE SODIUM 8.6-50 MG PO TABS
2.0000 | ORAL_TABLET | Freq: Every day | ORAL | Status: DC
  Administered 2024-01-22 – 2024-02-02 (×12): 2 via ORAL
  Filled 2024-01-22 (×12): qty 2

## 2024-01-22 NOTE — Progress Notes (Signed)
 Physical Therapy Session Note  Patient Details  Name: [name] MRN: [id number] Date of Birth: 24-Jun-1961  Today's Date: 01/22/2024 PT Individual Time: 0800-0900, 1300-1400 PT Individual Time Calculation (min): 60 min, 60 min   Short Term Goals: Week 1:  PT Short Term Goal 1 (Week 1): pt will perform sit to stand with CGA with LRAD PT Short Term Goal 2 (Week 1): pt will perform bed to chair transfer with CGA and LRAD PT Short Term Goal 3 (Week 1): pt will perform bed mobility with LRAD and CGA PT Short Term Goal 4 (Week 1): pt will ambulate 25 feet with LRAD and CGA  Skilled Therapeutic Interventions/Progress Updates:    Session 1: pt received in bed and agreeable to therapy. Pt reports 10/10 pain but able to participate with therapy. Nsg administered pain medication during session. Rest and positioning as needed.   Supine>sit with mod a and VC for spinal precautions/technique.  Discussed pt's ankle and h/o of surgery/dislocation. Noted limited range in all directions, eversion being most limited (2+/5). MD in/out for morning rounds.  Pt then assisted with doffing soiled brief and pants and donning clean. Hygiene with supervision seated EOB. Mod a Sit to stand from elevated bed and min a Stand pivot transfer. Hand and oral hygiene at sink at w/c level.  Pt remained in w/c, was left with all needs in reach and alarm active.   Session 2: Pt received in recliner and agreeable to therapy. Pt with IV running at this time, so therapist managed IV pole throughout session.   Pt reports increased stiffness and pain despite premedicating. Discussed pain management and pt reports she typically requires increased time to metabolize medications, so discussed scheduling/requesting medications appropriately to prepare for therapy. Sit to stand with min-mod a d/t stiffness and pain. Stand pivot transfer with min a. Pt propelled w/c with BUE ~90 ft but was limited by fibromyalgia pain. Stand pivot  transfer to nustep with min a and RW. Pt utilized x 12 min at level 1 for global strength and release of endorphins through pain free movement. Provided education on holistic benefit of exercise for pain, with pt expressing understanding and appreciation of activity. Pt returned to room and performed min a ambulatory transfer to bathroom, handed off to NT to complete toileting.    Therapy Documentation Precautions:  Precautions Precautions: Fall, Back Precaution Booklet Issued: Yes (comment) Recall of Precautions/Restrictions: Impaired Precaution/Restrictions Comments: Spinal precautions-No back brace required. Required Braces or Orthoses: Other Brace Other Brace: Pt with personal air cast on R ankle. Pt reports ankle instability for ~10 years from previous ankle dislocation. Restrictions Weight Bearing Restrictions Per Provider Order: No General:       Therapy/Group: Individual Therapy  Tex Filbert 01/22/2024, 12:36 PM

## 2024-01-22 NOTE — Progress Notes (Signed)
 PROGRESS NOTE   Subjective/Complaints: Pt up with therapy. No new issues today. Has not received AFO yet. Pain seems controlled. Slept ok but struggles with nasal mask on CPAP. Has nasal prongs on her home unit.  ROS: Patient denies fever, rash, sore throat, blurred vision, dizziness, nausea, vomiting, diarrhea, cough, shortness of breath or chest pain,  headache, or mood change.    Objective:   No results found. Recent Labs    01/20/24 0533 01/22/24 0430  WBC 10.9* 12.0*  HGB 10.4* 10.3*  HCT 33.3* 33.7*  PLT 395 348   Recent Labs    01/20/24 0533 01/22/24 0430  NA 140 141  K 3.8 3.8  CL 102 100  CO2 29 28  GLUCOSE 94 84  BUN 20 18  CREATININE 0.80 0.78  CALCIUM 9.2 9.1    Intake/Output Summary (Last 24 hours) at 01/22/2024 0932 Last data filed at 01/21/2024 1308 Gross per 24 hour  Intake 240 ml  Output --  Net 240 ml        Physical Exam: Vital Signs Blood pressure (!) 148/76, pulse 71, temperature 97.8 F (36.6 C), temperature source Oral, resp. rate 18, height 5\' 2"  (1.575 m), weight 97.4 kg, last menstrual period 11/20/2012, SpO2 100%.  Constitutional: No distress . Vital signs reviewed. HEENT: NCAT, EOMI, oral membranes moist Neck: supple Cardiovascular: RRR without murmur. No JVD    Respiratory/Chest: CTA Bilaterally without wheezes or rales. Normal effort    GI/Abdomen: BS +, non-tender, non-distended Ext: no clubbing, cyanosis, or edema Psych: pleasant and cooperative  Skin: Back incision CDI with puckering around incision Neuro:  Alert and oriented x 3. Normal insight and awareness. Intact Memory. Normal language and speech. Cranial nerve exam unremarkable. MMT: RUE 4+ to 5/5 and LUE 4-/5 to 4+/5 prox to distal, both still limited by shoulder pain/DJD. RLE 2/5 prox to 2/5 ADF/PF limited by right ankle. LLE 3-/5 HF, KE and 3+ADF/PF. Decreased LT below the umbilicus but senses general touch and  pain.. DTR's 1+ Musculoskeletal: DJD/limited ROM both shoulders L>>R. Right ankle with limited ADF/PF--30-50 degrees perhaps, chronic changes noted.    LB/midback still tender.    Assessment/Plan: 1. Functional deficits which require 3+ hours per day of interdisciplinary therapy in a comprehensive inpatient rehab setting. Physiatrist is providing close team supervision and 24 hour management of active medical problems listed below. Physiatrist and rehab team continue to assess barriers to discharge/monitor patient progress toward functional and medical goals  Care Tool:  Bathing        Body parts bathed by helper: Right arm, Left arm, Chest, Abdomen, Front perineal area, Buttocks, Right upper leg, Left upper leg, Face Body parts n/a: Right lower leg, Left lower leg   Bathing assist Assist Level: Minimal Assistance - Patient > 75%     Upper Body Dressing/Undressing Upper body dressing        Upper body assist Assist Level: Supervision/Verbal cueing    Lower Body Dressing/Undressing Lower body dressing      What is the patient wearing?: Pants, Incontinence brief     Lower body assist Assist for lower body dressing: Moderate Assistance - Patient 50 - 74%  Toileting Toileting    Toileting assist Assist for toileting: Maximal Assistance - Patient 25 - 49%     Transfers Chair/bed transfer  Transfers assist     Chair/bed transfer assist level: Minimal Assistance - Patient > 75%     Locomotion Ambulation   Ambulation assist      Assist level: Minimal Assistance - Patient > 75% Assistive device: Walker-rolling Max distance: 10 feet   Walk 10 feet activity   Assist     Assist level: Minimal Assistance - Patient > 75% Assistive device: Walker-rolling   Walk 50 feet activity   Assist Walk 50 feet with 2 turns activity did not occur: Safety/medical concerns         Walk 150 feet activity   Assist Walk 150 feet activity did not occur:  Safety/medical concerns         Walk 10 feet on uneven surface  activity   Assist Walk 10 feet on uneven surfaces activity did not occur: Safety/medical concerns         Wheelchair     Assist Is the patient using a wheelchair?: Yes Type of Wheelchair: Manual    Wheelchair assist level: Dependent - Patient 0%      Wheelchair 50 feet with 2 turns activity    Assist        Assist Level: Dependent - Patient 0%   Wheelchair 150 feet activity     Assist      Assist Level: Dependent - Patient 0%   Blood pressure (!) 148/76, pulse 71, temperature 97.8 F (36.6 C), temperature source Oral, resp. rate 18, height 5\' 2"  (1.575 m), weight 97.4 kg, last menstrual period 11/20/2012, SpO2 100%.  Medical Problem List and Plan: 1. Functional deficits secondary to T9/10 myelopathy d/t severe spinal stenosis  and hx of T9 discitis/osteomyelitis             -patient may  shower--pat wound dry after shower.              -ELOS/Goals: 14-21 days, goals are mod I to supervision/min assist with PT and OT  -Continue CIR therapies including PT, OT   -Spoke with Larinda Plover at [facility]. AFO actually wasn't quite done yet. They will be coming into today to complete fitting, etc.   2.  Antithrombotics: -DVT/anticoagulation:  Pharmaceutical: Lovenox             -antiplatelet therapy: N/A 3. Chronic Pain/Pain Management: Followed by [facility] on [address]/Dr. Wynelle [facility]) --Gabapentin 1200 mg TID. Tylenol prn.Parafon 500 mg qid.              --oxycodone prn. Oxycontin 10 mg bid.  4/17 pain  seems to be under control 4. Mood/Behavior/Sleep: LCSW to follow for evaluation and support.              -antipsychotic agents: N/A.              - Uses valium for insomnia? Panic attacks? --Melatonin prn added-pt hasn't used yet -sleeping better for the most part. See CPAP discussion below 5. Neuropsych/cognition: This patient is capable of making decisions on her own  behalf. 6. Skin/Wound Care: vac now dc'ed.  - continue dry dressing (ABD pad) to wound for now. 7. Fluids/Electrolytes/Nutrition: Monitor I/O.               --Vitamin supplement and prostat to help promote wound healing.    -intake reasonable so far.  8. Enterobacter/MSSA Lumbar infection: Continue Cefepime  while hospitalized and transition to Cipro 500 mg bid/Cefadroxil 1 gram bid at discharge.             --benadryl 50 mg tid prn allergies-->has been using couple of times a dayu             --follow up with Dr. Zelda Hickman  4/17 WBC's up to 12k today.    -she is afebrile, wound looks great   -no other concerning signs or sx at present 9. Recurrent Hypokalemia:  -4/17 today's level up stable at 3.8              --continue daily supplement   10.H/o IBS- C: Was not taking iron at home--will d/c.             --Black stools due to iron supplement.  -- Continue Amitiza and order stool guaiacs.  -there is also likely a component of neurogenic bowel as patient was having incontinence PTA (2 years) 4/17 pt with another incontinent bowel movement yesterday.   -will start pm dulcolax suppository tonight  -continue amitiza and am senna 11. HTN: Monitor BP TID --on Amlodipine, tenormin and Chlorthalidone. --likely need potassium supplement due to chlorthalidone.      13. Neurogenic Bladder/ incontinence/frequency: Has been incontinent of bladder at nights and during the day.  -4/16 still no pvr's scanned despite mutliple requests! - I/O cath for vol >350cc -ongoing intermittent incontinence   14. OSA: Uses CPAP at home-- pt using, she asked family to bring nasal unit from home as she's struggling with nasal mask hospital provided.  LOS: 3 days A FACE TO FACE EVALUATION WAS PERFORMED  Rawland Caddy 01/22/2024, 9:32 AM

## 2024-01-22 NOTE — IPOC Note (Addendum)
 Overall Plan of Care [facility]) Patient Details Name: [name] MRN: [id number] DOB: Feb 02, 1961  Admitting Diagnosis: Thoracic discitis  Hospital Problems: Principal Problem:   Thoracic discitis Active Problems:   Thoracic myelopathy   Neurogenic bowel   Neurogenic bladder     Functional Problem List: Nursing Bladder, Bowel, Edema, Endurance, Medication Management, Pain, Perception, Safety, Sensory, Skin Integrity  PT Balance, Endurance, Edema, Motor, Sensory, Skin Integrity  OT Pain, Safety, Endurance, Balance  SLP    TR         Basic ADL's: OT Grooming, Bathing, Dressing, Toileting     Advanced  ADL's: OT       Transfers: PT Bed Mobility, Bed to Chair, Car, Occupational psychologist, Research scientist (life sciences): PT Ambulation, Stairs     Additional Impairments: OT    SLP        TR      Anticipated Outcomes Item Anticipated Outcome  Self Feeding    Swallowing      Basic self-care  Mod I  Toileting  Mod I   Bathroom Transfers Mod I  Bowel/Bladder  manage bowels with medications/manage bladder with pvr's/medications  Transfers  mod I  Locomotion  mod I  Communication     Cognition     Pain  <4 w/ prns  Safety/Judgment  manage safety with supervision/mod I   Therapy Plan: PT Intensity: Minimum of 1-2 x/day ,45 to 90 minutes PT Frequency: 5 out of 7 days PT Duration Estimated Length of Stay: 2-3 weeks OT Intensity: Minimum of 1-2 x/day, 45 to 90 minutes OT Frequency: 5 out of 7 days OT Duration/Estimated Length of Stay: 12-14 days     Team Interventions: Nursing Interventions Patient/Family Education, Bladder Management, Bowel Management, Disease Management/Prevention, Skin Care/Wound Management, Medication Management, Discharge Planning, Psychosocial Support  PT interventions Ambulation/gait training, Community reintegration, DME/adaptive equipment instruction, Neuromuscular re-education, Psychosocial support, Stair training, UE/LE Strength  taining/ROM, Wheelchair propulsion/positioning, Warden/ranger, Discharge planning, Pain management, Skin care/wound management, Therapeutic Activities, UE/LE Coordination activities, Cognitive remediation/compensation, Disease management/prevention, Patient/family education, Functional mobility training, Splinting/orthotics, Therapeutic Exercise  OT Interventions Balance/vestibular training, Neuromuscular re-education, Self Care/advanced ADL retraining, Therapeutic Exercise, Wheelchair propulsion/positioning, UE/LE Strength taining/ROM, Pain management, DME/adaptive equipment instruction, Community reintegration, Equities trader education, Discharge planning, Therapeutic Activities, Functional mobility training  SLP Interventions    TR Interventions    SW/CM Interventions Discharge Planning, Psychosocial Support, Patient/Family Education   Barriers to Discharge MD  Medical stability and Neurogenic bowel and bladder  Nursing Decreased caregiver support, Home environment access/layout, Neurogenic Bowel & Bladder, Wound Care Discharge : House  Discharge Home Layout: One level  Discharge Home Access: Stairs to enter  Entrance Stairs-Rails: Can reach both  Entrance Stairs-Number of Steps: 5  PT Inaccessible home environment, Decreased caregiver support, Home environment access/layout, Incontinence, Wound Care, Lack of/limited family support, Weight    OT Weight, Decreased caregiver support, Lack of/limited family support History of fibromyalgia  SLP      SW Decreased caregiver support, Insurance for SNF coverage, Lack of/limited family support     Team Discharge Planning: Destination: PT-Home ,OT- Home , SLP-  Projected Follow-up: PT-Home health PT, OT-  Home health OT, SLP-  Projected Equipment Needs: PT-To be determined, OT- To be determined, Quad cane, SLP-  Equipment Details: PT-pt has SPC, RW, Rollator, shower chair, transfer tub bench, OT-DME owned: two Rolators,. 2 RW, crutches,  3 grab bars -tub/shower, TTB, manual WC (very wide. Doesn't fit through doorways at home), transport chair,  3 SPC, multiple reachers, 2-3 sock aids, long handled sponges (multiple), bed rail (on right side). Patient/family involved in discharge planning: PT- Patient,  OT-Patient, SLP-   MD ELOS: 2-3 weeks Medical Rehab Prognosis:  Excellent Assessment: The patient has been admitted for CIR therapies with the diagnosis of T9/10 myelopathy d/t severe spinal stenosis and hx of T9 discitis/osteomyelitis . The team will be addressing functional mobility, strength, stamina, balance, safety, adaptive techniques and equipment, self-care, bowel and bladder mgt, patient and caregiver education. Goals have been set at Mod I. Anticipated discharge destination is home.        See Team Conference Notes for weekly updates to the plan of care

## 2024-01-22 NOTE — Progress Notes (Signed)
 Occupational Therapy Session Note  Patient Details  Name: [name] MRN: [id number] Date of Birth: 02-14-1961  Today's Date: 01/22/2024 OT Individual Time: 4098-1191 OT Individual Time Calculation (min): 75 min    Short Term Goals: Week 1:  OT Short Term Goal 1 (Week 1): Pt will complete LB bathing and dressing with Max A and use of AE. OT Short Term Goal 2 (Week 1): Pt will completed toileting with Max A  Skilled Therapeutic Interventions/Progress Updates:    Skilled OT intervention with focus on functional amb with RW, toileting, and standing balance to increase independence with BADLs. Sit<>stand from w/c with min A throughout session. Amb with RW to bathroom to use toilet with min A. Amb with CGA throughout session. Standing activity with colored pets to complete pattern and remove from peg board. Pt also stood and used BUE to clean pets. Pt leaned on table for support when cleaning pets. Pt returned to room and amb with RW to recliner. Pt remained in relciner. All needs within reach.   Therapy Documentation Precautions:  Precautions Precautions: Fall, Back Precaution Booklet Issued: Yes (comment) Recall of Precautions/Restrictions: Impaired Precaution/Restrictions Comments: Spinal precautions-No back brace required. Required Braces or Orthoses: Other Brace Other Brace: Pt with personal air cast on R ankle. Pt reports ankle instability for ~10 years from previous ankle dislocation. Restrictions Weight Bearing Restrictions Per Provider Order: No   Pain: Pt reports 7/10 back pain; meds admin prior to therapy  Therapy/Group: Individual Therapy  Doak Free 01/22/2024, 12:15 PM

## 2024-01-23 DIAGNOSIS — G4733 Obstructive sleep apnea (adult) (pediatric): Secondary | ICD-10-CM | POA: Diagnosis not present

## 2024-01-23 DIAGNOSIS — K592 Neurogenic bowel, not elsewhere classified: Secondary | ICD-10-CM | POA: Diagnosis not present

## 2024-01-23 DIAGNOSIS — N319 Neuromuscular dysfunction of bladder, unspecified: Secondary | ICD-10-CM | POA: Diagnosis not present

## 2024-01-23 DIAGNOSIS — M4714 Other spondylosis with myelopathy, thoracic region: Secondary | ICD-10-CM | POA: Diagnosis not present

## 2024-01-23 DIAGNOSIS — I10 Essential (primary) hypertension: Secondary | ICD-10-CM

## 2024-01-23 MED ORDER — POLYETHYLENE GLYCOL 3350 17 G PO PACK
17.0000 g | PACK | Freq: Every day | ORAL | Status: DC
  Administered 2024-01-23 – 2024-02-02 (×11): 17 g via ORAL
  Filled 2024-01-23 (×11): qty 1

## 2024-01-23 NOTE — Progress Notes (Signed)
 PROGRESS NOTE   Subjective/Complaints: Pt got AFO, she reports some adjustments for planned and she is getting larger shoes to help fit her right.  She continues to report hospital CPAP mask does not fit as well as her home unit, she had been try to get family to bring it.  ROS: Patient denies fever, rash, sore throat, blurred vision, dizziness, nausea, vomiting, diarrhea, cough, shortness of breath or chest pain,  headache, or mood change.    Objective:   No results found. Recent Labs    01/22/24 0430  WBC 12.0*  HGB 10.3*  HCT 33.7*  PLT 348   Recent Labs    01/22/24 0430  NA 141  K 3.8  CL 100  CO2 28  GLUCOSE 84  BUN 18  CREATININE 0.78  CALCIUM  9.1    Intake/Output Summary (Last 24 hours) at 01/23/2024 1451 Last data filed at 01/23/2024 0454 Gross per 24 hour  Intake 100 ml  Output 104 ml  Net -4 ml        Physical Exam: Vital Signs Blood pressure (!) 162/90, pulse 63, temperature 98 F (36.7 C), temperature source Oral, resp. rate 16, height 5\' 2"  (1.575 m), weight 98.9 kg, last menstrual period 11/20/2012, SpO2 96%.  Constitutional: No distress . Vital signs reviewed. HEENT: NCAT, EOMI, oral membranes moist Neck: supple Cardiovascular: RRR without murmur. No JVD    Respiratory/Chest: CTA Bilaterally without wheezes or rales. Normal effort    GI/Abdomen: BS +, non-tender, non-distended Ext: no clubbing, cyanosis, or edema Psych: pleasant and cooperative  Skin: Back incision CDI with puckering around incision Neuro:  Alert and oriented x 3. Normal insight and awareness. Intact Memory. Normal language and speech. Cranial nerve exam unremarkable. MMT: RUE 4+ to 5/5 and LUE 4-/5 to 4+/5 prox to distal, both still limited by shoulder pain/DJD. RLE 2/5 prox to 2/5 ADF/PF limited by right ankle. LLE 3-/5 HF, KE and 3+ADF/PF. Decreased LT below the umbilicus but senses general touch and pain.. DTR's  1+ Musculoskeletal: DJD/limited ROM both shoulders L>>R. Right ankle with limited ADF/PF--30-50 degrees perhaps, chronic changes noted.    LB/midback still tender.   AFO at bedside   Assessment/Plan: 1. Functional deficits which require 3+ hours per day of interdisciplinary therapy in a comprehensive inpatient rehab setting. Physiatrist is providing close team supervision and 24 hour management of active medical problems listed below. Physiatrist and rehab team continue to assess barriers to discharge/monitor patient progress toward functional and medical goals  Care Tool:  Bathing        Body parts bathed by helper: Right arm, Left arm, Chest, Abdomen, Front perineal area, Buttocks, Right upper leg, Left upper leg, Face Body parts n/a: Right lower leg, Left lower leg   Bathing assist Assist Level: Minimal Assistance - Patient > 75%     Upper Body Dressing/Undressing Upper body dressing        Upper body assist Assist Level: Supervision/Verbal cueing    Lower Body Dressing/Undressing Lower body dressing      What is the patient wearing?: Pants, Incontinence brief     Lower body assist Assist for lower body dressing: Moderate Assistance - Patient 50 -  74%     Toileting Toileting    Toileting assist Assist for toileting: Contact Guard/Touching assist     Transfers Chair/bed transfer  Transfers assist     Chair/bed transfer assist level: Minimal Assistance - Patient > 75%     Locomotion Ambulation   Ambulation assist      Assist level: Minimal Assistance - Patient > 75% Assistive device: Walker-rolling Max distance: 20 ft   Walk 10 feet activity   Assist     Assist level: Minimal Assistance - Patient > 75% Assistive device: Walker-rolling   Walk 50 feet activity   Assist Walk 50 feet with 2 turns activity did not occur: Safety/medical concerns         Walk 150 feet activity   Assist Walk 150 feet activity did not occur: Safety/medical  concerns         Walk 10 feet on uneven surface  activity   Assist Walk 10 feet on uneven surfaces activity did not occur: Safety/medical concerns         Wheelchair     Assist Is the patient using a wheelchair?: Yes Type of Wheelchair: Manual    Wheelchair assist level: Dependent - Patient 0%      Wheelchair 50 feet with 2 turns activity    Assist        Assist Level: Dependent - Patient 0%   Wheelchair 150 feet activity     Assist      Assist Level: Dependent - Patient 0%   Blood pressure (!) 162/90, pulse 63, temperature 98 F (36.7 C), temperature source Oral, resp. rate 16, height 5\' 2"  (1.575 m), weight 98.9 kg, last menstrual period 11/20/2012, SpO2 96%.  Medical Problem List and Plan: 1. Functional deficits secondary to T9/10 myelopathy d/t severe spinal stenosis  and hx of T9 discitis/osteomyelitis             -patient may  shower--pat wound dry after shower.              -ELOS/Goals: 14-21 days, goals are mod I to supervision/min assist with PT and OT  -Continue CIR therapies including PT, OT   -Spoke with Larinda Plover at [facility]. AFO actually wasn't quite done yet. They will be coming into today to complete fitting, etc.   -AFO at bedside- pt reports [facility] coming for additional adjustments 2.  Antithrombotics: -DVT/anticoagulation:  Pharmaceutical: Lovenox              -antiplatelet therapy: N/A 3. Chronic Pain/Pain Management: Followed by [facility] on [address]/Dr. Wynelle Heather [facility]) --Gabapentin  1200 mg TID. Tylenol  prn.Parafon  500 mg qid.              --oxycodone  prn. Oxycontin  10 mg bid.  4/17 pain  seems to be under control 4. Mood/Behavior/Sleep: LCSW to follow for evaluation and support.              -antipsychotic agents: N/A.              - Uses valium  for insomnia? Panic attacks? --Melatonin prn added-pt hasn't used yet -sleeping better for the most part. See CPAP discussion below 5. Neuropsych/cognition: This  patient is capable of making decisions on her own behalf. 6. Skin/Wound Care: vac now dc'ed.  - continue dry dressing (ABD pad) to wound for now. 7. Fluids/Electrolytes/Nutrition: Monitor I/O.               --Vitamin supplement and prostat to help promote wound healing.    -  intake reasonable so far.  8. Enterobacter/MSSA Lumbar infection: Continue Cefepime  while hospitalized and transition to Cipro  500 mg bid/Cefadroxil  1 gram bid at discharge.             --benadryl  50 mg tid prn allergies-->has been using couple of times a dayu             --follow up with Dr. Zelda Hickman  4/17 WBC's up to 12k today.    -she is afebrile, wound looks great   -no other concerning signs or sx at present 9. Recurrent Hypokalemia:  -4/17 today's level up stable at 3.8              --continue daily supplement   10.H/o IBS- C: Was not taking iron at home--will d/c.             --Black stools due to iron supplement.  -- Continue Amitiza  and order stool guaiacs.  -there is also likely a component of neurogenic bowel as patient was having incontinence PTA (2 years) 4/17 pt with another incontinent bowel movement yesterday.   -will start pm dulcolax suppository tonight  -continue amitiza  and am senna 4/18 LBM yesterday, Bms often documented as hard, add miralax  daily 11. HTN: Monitor BP TID --on Amlodipine , tenormin  and Chlorthalidone . --likely need potassium supplement due to chlorthalidone .      01/23/2024    1:21 PM 01/23/2024    4:31 AM 01/22/2024    8:00 PM  Vitals with BMI  Weight  218 lbs 1 oz   BMI  39.87   Systolic 162 141 413  Diastolic 90 71 66  Pulse 63 68 76  Intermittently elevated, continue to monitor trend for now      13. Neurogenic Bladder/ incontinence/frequency: Has been incontinent of bladder at nights and during the day.  -4/16 still no pvr's scanned despite mutliple requests! - I/O cath for vol >350cc -ongoing intermittent incontinence   -4/17 PVR not elevated, will try timed voids,  consider anticholinergic  14. OSA: Uses CPAP at home-- pt using, she asked family to bring nasal unit from home as she's struggling with nasal mask hospital provided.  -4/19 Discussed asking family again to try to bring home unit  LOS: 4 days A FACE TO FACE EVALUATION WAS PERFORMED  Lylia Sand 01/23/2024, 2:51 PM

## 2024-01-23 NOTE — Group Note (Signed)
 Patient Details Name: [name] MRN: [id number] DOB: [birth year] Today's Date: 01/23/2024  Time Calculation: OT Group Time Calculation OT Group Start Time: 1430 OT Group Stop Time: 1530 OT Group Time Calculation (min): 60 min      Group Description: Dance Group: Pt participated in dance group with an emphasis on social interaction, motor planning, increasing overall activity tolerance and bimanual tasks. All songs were selected by group members. Dance moves included AROM of BUE/BLE gross motor movements with an emphasis on building functional endurance.    Individual level documentation: Patient completed group from sitting level. Patientt needed supervision to complete various dance moves with OT providing visual model.  Patient needed min modifications during group.  Pain:  0/10  Precautions:  Spinal without brace, BLT  Billey Budds Woodson 01/23/2024, 4:00 PM

## 2024-01-23 NOTE — Progress Notes (Signed)
 Physical Therapy Session Note  Patient Details  Name: [name] MRN: [id number] Date of Birth: June 15, 1961  Today's Date: 01/23/2024 PT Individual Time: 1315-1345 PT Individual Time Calculation (min): 30 min   Short Term Goals: Week 1:  PT Short Term Goal 1 (Week 1): pt will perform sit to stand with CGA with LRAD PT Short Term Goal 2 (Week 1): pt will perform bed to chair transfer with CGA and LRAD PT Short Term Goal 3 (Week 1): pt will perform bed mobility with LRAD and CGA PT Short Term Goal 4 (Week 1): pt will ambulate 25 feet with LRAD and CGA  Skilled Therapeutic Interventions/Progress Updates:    Pt received in recliner and agreeable to therapy, but requesting to eat part of her lunch as it had been delivered just prior to therapist arrival. Nsg present on arrival to administer pain medication. Pt missed x 15 min scheduled therapy d/t finishing lunch and vitals taken before commencing session. Discussed AFO and therapist reached out to [facility] to request visit, plan for vendor to come out on Monday to assess. Pt then participated in ankle strengthening and ROM activities to improve stability and reduce chance of future sprain. Pt performed ankle circles x 10 each direction, followed by active inversion, eversion, and dorsiflexion against loose yellow theraband. Provided education on technique, anatomy, and injury prevention strategies. Pt educated on performing active ROM when able to improve proprioception and strength in ankle for improved BOS in standing. Pt remained in recliner at end of session, was left with all needs in reach and alarm active.   Therapy Documentation Precautions:  Precautions Precautions: Fall, Back Precaution Booklet Issued: Yes (comment) Recall of Precautions/Restrictions: Impaired Precaution/Restrictions Comments: Spinal precautions-No back brace required. Required Braces or Orthoses: Other Brace Other Brace: Pt with personal air cast on R ankle. Pt  reports ankle instability for ~10 years from previous ankle dislocation. Restrictions Weight Bearing Restrictions Per Provider Order: No General: PT Amount of Missed Time (min): 15 Minutes PT Missed Treatment Reason: Other (Comment);Nursing care (lunch, vitals taken)     Therapy/Group: Individual Therapy  Tex Filbert 01/23/2024, 3:35 PM

## 2024-01-23 NOTE — Progress Notes (Signed)
 Occupational Therapy Session Note  Patient Details  Name: [name] MRN: [id number] Date of Birth: 1961-01-06  Today's Date: 01/23/2024 OT Individual Time: 7829-5621 OT Individual Time Calculation (min): 75 min    Short Term Goals: Week 1:  OT Short Term Goal 1 (Week 1): Pt will complete LB bathing and dressing with Max A and use of AE. OT Short Term Goal 2 (Week 1): Pt will completed toileting with Max A  Skilled Therapeutic Interventions/Progress Updates:    Pt resting in bed upon arrival. Supine>sit EOB with min A and mod verbal cues for technique and back precautions. Pt declined use of toilet. Skilled OT intervention with focus on sit<>stand and standing balance. Pt dependent for donning socks/shoes (air splint) this morning. Block practice sit<>stand from w/c x 10 with CGA. Focus on controlled stand>sit. Standing activity with [employer]. No LOB noted. Continued discharge planning. Pt returned to room and amb 10' with RW to recliner. Pt remained in recliner with all needs within reach.  Therapy Documentation Precautions:  Precautions Precautions: Fall, Back Precaution Booklet Issued: Yes (comment) Recall of Precautions/Restrictions: Impaired Precaution/Restrictions Comments: Spinal precautions-No back brace required. Required Braces or Orthoses: Other Brace Other Brace: Pt with personal air cast on R ankle. Pt reports ankle instability for ~10 years from previous ankle dislocation. Restrictions Weight Bearing Restrictions Per Provider Order: No   Pain:  Pt c/o 8/10 back pain this morning; meds admin at beginning of session  Therapy/Group: Individual Therapy  Doak Free 01/23/2024, 11:34 AM

## 2024-01-23 NOTE — Progress Notes (Signed)
 Occupational Therapy Session Note  Patient Details  Name: [name] MRN: [id number] Date of Birth: 1961-04-16  Today's Date: 01/23/2024 OT Individual Time: 1030-1105 OT Individual Time Calculation (min): 35 min    Short Term Goals: Week 1:  OT Short Term Goal 1 (Week 1): Pt will complete LB bathing and dressing with Max A and use of AE. OT Short Term Goal 2 (Week 1): Pt will completed toileting with Max A  Skilled Therapeutic Interventions/Progress Updates:    Pt resting in recliner. MD ordered Ted hose. Instructed pt in donning Ted hose using plastic bag to facilitate pulling over toes and heel. Continued discharge planning. Block practice sit<>stand from recliner x 3 with focus on controlled descent. Min A for sit<>stand from lower surface. Pt remained in relciner with all needs within reach.   Therapy Documentation Precautions:  Precautions Precautions: Fall, Back Precaution Booklet Issued: Yes (comment) Recall of Precautions/Restrictions: Impaired Precaution/Restrictions Comments: Spinal precautions-No back brace required. Required Braces or Orthoses: Other Brace Other Brace: Pt with personal air cast on R ankle. Pt reports ankle instability for ~10 years from previous ankle dislocation. Restrictions Weight Bearing Restrictions Per Provider Order: No   Pain:  Pt reports her back pain is better compared to earlier in morning; emotional support   Therapy/Group: Individual Therapy  Maritza Debby Mare 01/23/2024, 11:38 AM

## 2024-01-24 DIAGNOSIS — M4644 Discitis, unspecified, thoracic region: Secondary | ICD-10-CM | POA: Diagnosis not present

## 2024-01-24 NOTE — Progress Notes (Signed)
 PROGRESS NOTE   Subjective/Complaints:  Pt went over entire medical issues bringing her in to the hospital and her AFO as well as CPAP which she's already shared with team.  Also explained her fibromyalgia pain adds to things- doesn't want to make a decision on things without Dr Michale Age.   Said last surgery gave relief! LBM yesterday Loved dance class yesterday.  Pain stabbing at incision- but no AM meds yet.   ROS:   Pt denies SOB, abd pain, CP, N/V/C/D, and vision changes   Objective:   No results found. Recent Labs    01/22/24 0430  WBC 12.0*  HGB 10.3*  HCT 33.7*  PLT 348   Recent Labs    01/22/24 0430  NA 141  K 3.8  CL 100  CO2 28  GLUCOSE 84  BUN 18  CREATININE 0.78  CALCIUM  9.1    Intake/Output Summary (Last 24 hours) at 01/24/2024 1949 Last data filed at 01/24/2024 1829 Gross per 24 hour  Intake 1120 ml  Output --  Net 1120 ml        Physical Exam: Vital Signs Blood pressure (!) 171/90, pulse 84, temperature 98.5 F (36.9 C), resp. rate 18, height 5\' 2"  (1.575 m), weight 98.5 kg, last menstrual period 11/20/2012, SpO2 97%.    General: awake, alert, appropriate, sitting up in bed wearing mask; NAD HENT: conjugate gaze; oropharynx moist CV: regular rate and rhythm; no JVD Pulmonary: CTA B/L; no W/R/R- good air movement GI: soft, NT, ND, (+)BS- normoactive Psychiatric: appropriate- extremely interactive Neurological: Ox3  Skin: Back incision CDI with puckering around incision Neuro:  Alert and oriented x 3. Normal insight and awareness. Intact Memory. Normal language and speech. Cranial nerve exam unremarkable. MMT: RUE 4+ to 5/5 and LUE 4-/5 to 4+/5 prox to distal, both still limited by shoulder pain/DJD. RLE 2/5 prox to 2/5 ADF/PF limited by right ankle. LLE 3-/5 HF, KE and 3+ADF/PF. Decreased LT below the umbilicus but senses general touch and pain.. DTR's 1+ Musculoskeletal:  DJD/limited ROM both shoulders L>>R. Right ankle with limited ADF/PF--30-50 degrees perhaps, chronic changes noted.    LB/midback still tender.   AFO at bedside   Assessment/Plan: 1. Functional deficits which require 3+ hours per day of interdisciplinary therapy in a comprehensive inpatient rehab setting. Physiatrist is providing close team supervision and 24 hour management of active medical problems listed below. Physiatrist and rehab team continue to assess barriers to discharge/monitor patient progress toward functional and medical goals  Care Tool:  Bathing        Body parts bathed by helper: Right arm, Left arm, Chest, Abdomen, Front perineal area, Buttocks, Right upper leg, Left upper leg, Face Body parts n/a: Right lower leg, Left lower leg   Bathing assist Assist Level: Minimal Assistance - Patient > 75%     Upper Body Dressing/Undressing Upper body dressing        Upper body assist Assist Level: Supervision/Verbal cueing    Lower Body Dressing/Undressing Lower body dressing      What is the patient wearing?: Pants, Incontinence brief     Lower body assist Assist for lower body dressing: Moderate Assistance - Patient 50 -  74%     Toileting Toileting    Toileting assist Assist for toileting: Contact Guard/Touching assist     Transfers Chair/bed transfer  Transfers assist     Chair/bed transfer assist level: Minimal Assistance - Patient > 75%     Locomotion Ambulation   Ambulation assist      Assist level: Minimal Assistance - Patient > 75% Assistive device: Walker-rolling Max distance: 20 ft   Walk 10 feet activity   Assist     Assist level: Minimal Assistance - Patient > 75% Assistive device: Walker-rolling   Walk 50 feet activity   Assist Walk 50 feet with 2 turns activity did not occur: Safety/medical concerns         Walk 150 feet activity   Assist Walk 150 feet activity did not occur: Safety/medical concerns          Walk 10 feet on uneven surface  activity   Assist Walk 10 feet on uneven surfaces activity did not occur: Safety/medical concerns         Wheelchair     Assist Is the patient using a wheelchair?: Yes Type of Wheelchair: Manual    Wheelchair assist level: Dependent - Patient 0%      Wheelchair 50 feet with 2 turns activity    Assist        Assist Level: Dependent - Patient 0%   Wheelchair 150 feet activity     Assist      Assist Level: Dependent - Patient 0%   Blood pressure (!) 171/90, pulse 84, temperature 98.5 F (36.9 C), resp. rate 18, height 5\' 2"  (1.575 m), weight 98.5 kg, last menstrual period 11/20/2012, SpO2 97%.  Medical Problem List and Plan: 1. Functional deficits secondary to T9/10 myelopathy d/t severe spinal stenosis  and hx of T9 discitis/osteomyelitis             -patient may  shower--pat wound dry after shower.              -ELOS/Goals: 14-21 days, goals are mod I to supervision/min assist with PT and OT  -Continue CIR therapies including PT, OT   -Spoke with Larinda Plover at [facility]. AFO actually wasn't quite done yet. They will be coming into today to complete fitting, etc.   -AFO at bedside- pt reports hanger coming for additional adjustments 4/19- pt reports still getting a shoe to fit AFO Cont' CIR 2.  Antithrombotics: -DVT/anticoagulation:  Pharmaceutical: Lovenox              -antiplatelet therapy: N/A 3. Chronic Pain/Pain Management: Followed by [facility] on [address]/Dr. Wynelle Heather [facility]) --Gabapentin  1200 mg TID. Tylenol  prn.Parafon  500 mg qid.              --oxycodone  prn. Oxycontin  10 mg bid.  4/17 pain  seems to be under control  4/19- reports stabbing pain at incision, but overall tolerable 4. Mood/Behavior/Sleep: LCSW to follow for evaluation and support.              -antipsychotic agents: N/A.              - Uses valium  for insomnia? Panic attacks? --Melatonin prn added-pt hasn't used yet -sleeping  better for the most part. See CPAP discussion below 5. Neuropsych/cognition: This patient is capable of making decisions on her own behalf. 6. Skin/Wound Care: vac now dc'ed.  - continue dry dressing (ABD pad) to wound for now. 7. Fluids/Electrolytes/Nutrition: Monitor I/O.               --  Vitamin supplement and prostat to help promote wound healing.    -intake reasonable so far.  8. Enterobacter/MSSA Lumbar infection: Continue Cefepime  while hospitalized and transition to Cipro  500 mg bid/Cefadroxil  1 gram bid at discharge.             --benadryl  50 mg tid prn allergies-->has been using couple of times a dayu             --follow up with Dr. Zelda Hickman  4/17 WBC's up to 12k today.    -she is afebrile, wound looks great   -no other concerning signs or sx at present  4/19- will check Monday 9. Recurrent Hypokalemia:  -4/17 today's level up stable at 3.8              --continue daily supplement   10.H/o IBS- C: Was not taking iron at home--will d/c.             --Black stools due to iron supplement.  -- Continue Amitiza  and order stool guaiacs.  -there is also likely a component of neurogenic bowel as patient was having incontinence PTA (2 years) 4/17 pt with another incontinent bowel movement yesterday.   -will start pm dulcolax suppository tonight  -continue amitiza  and am senna 4/18 LBM yesterday, Bms often documented as hard, add miralax  daily 4/19- LBM yesterday- pt didn't report hard 11. HTN: Monitor BP TID --on Amlodipine , tenormin  and Chlorthalidone . --likely need potassium supplement due to chlorthalidone .      01/24/2024    7:40 PM 01/24/2024    4:50 PM 01/24/2024    4:00 PM  Vitals with BMI  Weight  217 lbs 2 oz   BMI  39.71   Systolic 171  159  Diastolic 90  70  Pulse 84  60  Intermittently elevated, continue to monitor trend for now  4/19- BP running elevated 150's to 170's today- on Atenolol  50 mg daily- if still elevated tomorrow, will increase Atenolol     13.  Neurogenic Bladder/ incontinence/frequency: Has been incontinent of bladder at nights and during the day.  -4/16 still no pvr's scanned despite mutliple requests! - I/O cath for vol >350cc -ongoing intermittent incontinence   -4/17 PVR not elevated, will try timed voids, consider anticholinergic  4/19- double checking bladder scans, since pt thinks is retaining.  14. OSA: Uses CPAP at home-- pt using, she asked family to bring nasal unit from home as she's struggling with nasal mask hospital provided.  -4/18-4/19 Discussed asking family again to try to bring home unit- pt says will try to get nephew to bring in.    I spent a total of  57  minutes on total care today- >50% coordination of care- due to  Wa sin pt's room for 40 minutes- also review of chart and d/w nursing.   LOS: 5 days A FACE TO FACE EVALUATION WAS PERFORMED  Moyses Pavey 01/24/2024, 7:49 PM

## 2024-01-24 NOTE — Progress Notes (Signed)
 Occupational Therapy Session Note  Patient Details  Name: [name] MRN: [id number] Date of Birth: Nov 17, 1960  Today's Date: 01/24/2024 OT Individual Time: 9562-1308; 6578-4696 OT Individual Time Calculation (min): 124 min    Short Term Goals: Week 1:  OT Short Term Goal 1 (Week 1): Pt will complete LB bathing and dressing with Max A and use of AE. OT Short Term Goal 2 (Week 1): Pt will completed toileting with Max A  Skilled Therapeutic Interventions/Progress Updates:    Session 1: Patient receive resting in bed, but reports having completed ADL activities. Assisted patient with log roll to EOB Min assist. Total assist to don teds and shoes. Patient able to stand from EOB with Min assist for RW stepping transfer to w/c. Patient assisted to therapy gym for dynamic standing activities with the RW for UE support/balance. Patient able to stand 3 x 2 minutes for reaching activity moderate cues needed to improve posture. Patient with good safety reaching back to chair before sitting. Continued treatment with functional transfer training. Patient wanting to get in recliner at end of treatment. Use RW to ambulate 10 feet to recliner. Pillows placed behind the patient for comfort. Call light and rolling table with water and phone in reach. Good participation with OT treatment    Session 2: Patient seen for w/c mobility and endurance exercises rolling w/c to elevator and then to gift shop to make a purchase. Patient needing assist to keep w/c going straight and navigate in tight spaces in the gift shop. Patient aware of possible need for w/c upon discharge for safe ADL/IADL performance in the home. Patient needing frequent rest breaks following w/c management every 15 meters. Good safety awareness and navigating to and from room. Patient assisted with toileting in the room. Needed Assist with clothing management and pericare. Patient educated on bidet toilet seat adaptations that would allow patient to  be independent with cleaning skin. Patient feels this may be a good option so that she does not twist during toileting and pericare. Continued with hand hygiene at the sink and ambulation back to the EOB. Patient needing Mod/Max assist for safe log roll. Continue with skilled OT POC to improve safety and independence in preparation for discharge home with family support.   Therapy Documentation Precautions:  Precautions Precautions: Fall, Back Precaution Booklet Issued: Yes (comment) Recall of Precautions/Restrictions: Impaired Precaution/Restrictions Comments: Spinal precautions-No back brace required. Required Braces or Orthoses: Other Brace Other Brace: Pt with personal air cast on R ankle. Pt reports ankle instability for ~10 years from previous ankle dislocation. Restrictions Weight Bearing Restrictions Per Provider Order: No General:   Vital Signs:   Pain: Pain Assessment Pain Scale: 0-10 Pain Score: 10-Worst pain ever Pain Location: Head Pain Intervention(s): Medication (See eMAR) ADL: ADL Eating: Independent Where Assessed-Eating: Bed level Grooming: Setup Where Assessed-Grooming: Sitting at sink Upper Body Bathing: Setup Where Assessed-Upper Body Bathing: Sitting at sink Lower Body Bathing: Dependent Where Assessed-Lower Body Bathing: Sitting at sink Upper Body Dressing: Setup Where Assessed-Upper Body Dressing: Edge of bed Lower Body Dressing: Dependent Where Assessed-Lower Body Dressing: Edge of bed Toileting: Dependent Where Assessed-Toileting: Bedside Commode Toilet Transfer: Minimal assistance Toilet Transfer Method: Stand pivot Acupuncturist: Animator Transfer: Not assessed Film/video editor: Not assessed    Therapy/Group: Individual Therapy  Marty Sleet 01/24/2024, 4:22 PM

## 2024-01-24 NOTE — Progress Notes (Signed)
 Sleep chart in place and patient monitored throughout shift in no acute distress resulting 8/12 hours.

## 2024-01-24 NOTE — Progress Notes (Signed)
 Patient was unable to wear CPAP, states her family member will bringing her machine from home today. No noted respiratory distress or discomfort noted

## 2024-01-24 NOTE — Progress Notes (Signed)
 Physical Therapy Session Note  Patient Details  Name: [name] MRN: [id number] Date of Birth: [birth year]  Today's Date: 01/24/2024 PT Individual Time: 1047-1200 PT Individual Time Calculation (min): 73 min   Short Term Goals: Week 1:  PT Short Term Goal 1 (Week 1): pt will perform sit to stand with CGA with LRAD PT Short Term Goal 2 (Week 1): pt will perform bed to chair transfer with CGA and LRAD PT Short Term Goal 3 (Week 1): pt will perform bed mobility with LRAD and CGA PT Short Term Goal 4 (Week 1): pt will ambulate 25 feet with LRAD and CGA  Skilled Therapeutic Interventions/Progress Updates:   Received pt sitting in recliner, pt agreeable to PT treatment, and reported pain 8/10 in low back and R knee (premedicated). Session with emphasis on functional mobility/transfers, toileting, generalized strengthening and endurance, dynamic standing balance/coordination, and gait training. Stood from recliner with RW and CGA and ambulated 12ft with RW and min A to bedside commode. Pt able to manage clothing with CGA for balance, void, and perform hygiene management without assist. Stood from bedside commode with RW and CGA and required mod A to pull pants over hips - ambulated additional 46ft with RW and min A to WC and sat in WC at sink and washed hands with set up assist.   Pt transported to/from room in [facility] dependently for time management and energy conservation purposes and requested to work on bike. Stood from [facility] with RW and CGA and ambulated 39ft with RW and min A to Nustep. Pt performed seated BUE/BLE strengthening on Nustep at workload 2 for minutes for a total of 284 steps - pt required 1 rest/water break during activity. Worked on pace partner program - 0.2 miles, 33 SPM, and avg METs 1.5 - limited by R knee pain and provided pt with ice pack for relief - pt reported thinking she "overdid it". Pt ambulated additional 38ft with RW and min A then returned to room. Transferred WC<>recliner  via stand<>pivot with RW and CGA and concluded session with pt sitting in recliner with all needs within reach and RN at bedside administering medications.   Therapy Documentation Precautions:  Precautions Precautions: Fall, Back Precaution Booklet Issued: Yes (comment) Recall of Precautions/Restrictions: Impaired Precaution/Restrictions Comments: Spinal precautions-No back brace required. Required Braces or Orthoses: Other Brace Other Brace: Pt with personal air cast on R ankle. Pt reports ankle instability for ~10 years from previous ankle dislocation. Restrictions Weight Bearing Restrictions Per Provider Order: No  Therapy/Group: Individual Therapy Nicolas Barren Zaunegger Nena Bank PT, DPT 01/24/2024, 7:12 AM

## 2024-01-25 NOTE — Progress Notes (Signed)
 Patient refuses CPAP at this time. She is awaiting her own equipment from home as she prefers it over hospital provided equipment.

## 2024-01-25 NOTE — Plan of Care (Signed)
  Problem: Consults Goal: RH SPINAL CORD INJURY PATIENT EDUCATION Description:  See Patient Education module for education specifics.  Outcome: Progressing   Problem: SCI BOWEL ELIMINATION Goal: RH STG MANAGE BOWEL WITH ASSISTANCE Description: STG Manage Bowel with supervision/mod I Assistance. Outcome: Progressing Goal: RH STG SCI MANAGE BOWEL WITH MEDICATION WITH ASSISTANCE Description: STG SCI Manage bowel with medication with supervision/mod I assistance. Outcome: Progressing   Problem: SCI BLADDER ELIMINATION Goal: RH STG MANAGE BLADDER WITH ASSISTANCE Description: STG Manage Bladder With supervision/mod I Assistance Outcome: Progressing   Problem: RH SKIN INTEGRITY Goal: RH STG SKIN FREE OF INFECTION/BREAKDOWN Outcome: Progressing

## 2024-01-26 DIAGNOSIS — M4714 Other spondylosis with myelopathy, thoracic region: Secondary | ICD-10-CM | POA: Diagnosis not present

## 2024-01-26 DIAGNOSIS — N319 Neuromuscular dysfunction of bladder, unspecified: Secondary | ICD-10-CM | POA: Diagnosis not present

## 2024-01-26 DIAGNOSIS — K592 Neurogenic bowel, not elsewhere classified: Secondary | ICD-10-CM | POA: Diagnosis not present

## 2024-01-26 DIAGNOSIS — M4644 Discitis, unspecified, thoracic region: Secondary | ICD-10-CM | POA: Diagnosis not present

## 2024-01-26 LAB — CBC
HCT: 34.4 % — ABNORMAL LOW (ref 36.0–46.0)
Hemoglobin: 10.3 g/dL — ABNORMAL LOW (ref 12.0–15.0)
MCH: 25.2 pg — ABNORMAL LOW (ref 26.0–34.0)
MCHC: 29.9 g/dL — ABNORMAL LOW (ref 30.0–36.0)
MCV: 84.3 fL (ref 80.0–100.0)
Platelets: 304 10*3/uL (ref 150–400)
RBC: 4.08 MIL/uL (ref 3.87–5.11)
RDW: 15 % (ref 11.5–15.5)
WBC: 9.6 10*3/uL (ref 4.0–10.5)
nRBC: 0 % (ref 0.0–0.2)

## 2024-01-26 LAB — BASIC METABOLIC PANEL WITH GFR
Anion gap: 8 (ref 5–15)
BUN: 16 mg/dL (ref 8–23)
CO2: 28 mmol/L (ref 22–32)
Calcium: 9.1 mg/dL (ref 8.9–10.3)
Chloride: 104 mmol/L (ref 98–111)
Creatinine, Ser: 0.62 mg/dL (ref 0.44–1.00)
GFR, Estimated: >60 mL/min (ref >60)
Glucose, Bld: 86 mg/dL (ref 70–99)
Potassium: 4 mmol/L (ref 3.5–5.1)
Sodium: 140 mmol/L (ref 135–145)

## 2024-01-26 MED ORDER — MIRABEGRON ER 25 MG PO TB24
25.0000 mg | ORAL_TABLET | Freq: Every day | ORAL | Status: DC
  Administered 2024-01-26 – 2024-02-02 (×8): 25 mg via ORAL
  Filled 2024-01-26 (×8): qty 1

## 2024-01-26 MED ORDER — BISACODYL 10 MG RE SUPP: 10.0000 mg | Freq: Every day | RECTAL | Status: DC | PRN

## 2024-01-26 NOTE — Progress Notes (Signed)
 RT assisted pt with home CPAP unit. Pt resting comfortably on CPAP at this time.

## 2024-01-26 NOTE — Progress Notes (Signed)
 Occupational Therapy Session Note  Patient Details  Name: [name] MRN: [id number] Date of Birth: 08-Apr-1961  Today's Date: 01/26/2024 OT Individual Time: 1478-2956 OT Individual Time Calculation (min): 75 min    Short Term Goals: Week 2:  OT Short Term Goal 1 (Week 2): Pt will complete LB bating with mod A using AE PRN OT Short Term Goal 2 (Week 2): Pt will complete LB dressing with mod A using AE PRN OT Short Term Goal 3 (Week 2): Pt will perfrom toilet transfer with supervision  Skilled Therapeutic Interventions/Progress Updates:    Pt resting in bed upon arrival and ready for therapy. Supine>sit EOB with supervision HOB elevated and using bed rails. Stand pivot tranfser to w/c with RW and CGA. Toilet tranfser with CGA. Pt completed toileting and dressing with sit<>stand from [facility] in bathroom. Pt uses reacher to assist with threading pants. CGA when standing. Pt requires tot A for donning shoes/socks and air splint. Pt transitioned to day room for standing activities with focus on balance and endurance. Pt engaged in card board activity-retrieiving and replacing cards. All standing balance with CGA. Focus on activity tolerance, sit<>stand, standing balance, and safety awareness to increase independence with BADLs.   Therapy Documentation Precautions:  Precautions Precautions: Fall, Back Precaution Booklet Issued: Yes (comment) Recall of Precautions/Restrictions: Impaired Precaution/Restrictions Comments: Spinal precautions-No back brace required. Required Braces or Orthoses: Other Brace Other Brace: Pt with personal air cast on R ankle. Pt reports ankle instability for ~10 years from previous ankle dislocation. Restrictions Weight Bearing Restrictions Per Provider Order: No Pain:  Pt reports 10/10 back pain; meds admin by LPN during session    Therapy/Group: Individual Therapy  Doak Free 01/26/2024, 12:36 PM

## 2024-01-26 NOTE — Progress Notes (Signed)
 Physical Therapy Session Note  Patient Details  Name: [name] MRN: [id number] Date of Birth: May 16, 1961  Today's Date: 01/26/2024 PT Individual Time: 1300-1415 PT Individual Time Calculation (min): 75 min   Short Term Goals: Week 1:  PT Short Term Goal 1 (Week 1): pt will perform sit to stand with CGA with LRAD PT Short Term Goal 2 (Week 1): pt will perform bed to chair transfer with CGA and LRAD PT Short Term Goal 3 (Week 1): pt will perform bed mobility with LRAD and CGA PT Short Term Goal 4 (Week 1): pt will ambulate 25 feet with LRAD and CGA  Skilled Therapeutic Interventions/Progress Updates:    Pt received in recliner and agreeable to therapy.  Pt reports headache pain and back pain, "it's 20 but I'll say 10/10 pain." Nsg provided pain medication at start of session. Rest and activity modification throughout session.   ambulatory transfer to w/c and then to nustep with CGA and increased time for transfer with RW. Used rolling hill program for strength and endurance, as well as release of endorphins in response to exercise for holistic pain management, 2-8 load interval, x 10 minutes, 30-40 spm. Educated and cued to prevent knee hyperextension thrust while pedaling and in standing during transfers/gait.   Pt requesting to use bathroom d/t wet brief. Doffed brief and further continent void in toilet.  Hygiene with supervision, hand hygiene in standing at sink with supervision.   Gait x 40 ft, x 50 ft, 4 x 10 ft during session with min a Sit to stand and CGA with RW. Slow deliberate speed, educated on hyperextension thrust and cued for heel toe pattern vs flat foot, pt able to perform with no increased assist.   Pt returned to room and recliner, was left with all needs in reach and alarm active.   Therapy Documentation Precautions:  Precautions Precautions: Fall, Back Precaution Booklet Issued: Yes (comment) Recall of Precautions/Restrictions:  Impaired Precaution/Restrictions Comments: Spinal precautions-No back brace required. Required Braces or Orthoses: Other Brace Other Brace: Pt with personal air cast on R ankle. Pt reports ankle instability for ~10 years from previous ankle dislocation. Restrictions Weight Bearing Restrictions Per Provider Order: No General:   Vital Signs:   Pain: Pain Assessment Pain Scale: 0-10 Pain Score: 10-Worst pain ever Pain Location: Head Pain Intervention(s): Medication (See eMAR) Mobility:   Locomotion :    Trunk/Postural Assessment :    Balance:   Exercises:   Other Treatments:      Therapy/Group: Individual Therapy  Tex Filbert 01/26/2024, 1:35 PM

## 2024-01-26 NOTE — Progress Notes (Signed)
 Occupational Therapy Session Note  Patient Details  Name: [name] MRN: [id number] Date of Birth: Aug 22, 1961  Today's Date: 01/26/2024 OT Individual Time: 1100-1200 OT Individual Time Calculation (min): 60 min    Short Term Goals: Week 2:  OT Short Term Goal 1 (Week 2): Pt will complete LB bating with mod A using AE PRN OT Short Term Goal 2 (Week 2): Pt will complete LB dressing with mod A using AE PRN OT Short Term Goal 3 (Week 2): Pt will perfrom toilet transfer with supervision  Skilled Therapeutic Interventions/Progress Updates:    Skilled OT intervention with focus on BUE therex for endurance and strengtheing, and standing balance to increase pt's independence with BADLs. All transfers and amb with RW at [facility]. Sit<>stand with CGA. 5 mins x 2 on UBE RPM 40. Standing activities at [facility] 6x35mins with rest breaks. CGA for all standing activities. Min verbal cues to avoid twisting when reaching. Pt retunred to room and amb with RW to recliner. Pt remained in relciner with all needs within reach.   Therapy Documentation Precautions:  Precautions Precautions: Fall, Back Precaution Booklet Issued: Yes (comment) Recall of Precautions/Restrictions: Impaired Precaution/Restrictions Comments: Spinal precautions-No back brace required. Required Braces or Orthoses: Other Brace Other Brace: Pt with personal air cast on R ankle. Pt reports ankle instability for ~10 years from previous ankle dislocation. Restrictions Weight Bearing Restrictions Per Provider Order: No Pain:  Pt reports 6/10 back pain; meds admin prior to therapy    Therapy/Group: Individual Therapy  Doak Free 01/26/2024, 12:40 PM

## 2024-01-26 NOTE — Progress Notes (Signed)
 PROGRESS NOTE   Subjective/Complaints:  Pt using CPAP machine/nasal unit from home which works much better for her. Sleeping better. AFO brought in by [facility] but is not fitting in shoe!  ROS: Patient denies fever, rash, sore throat, blurred vision, dizziness, nausea, vomiting, diarrhea, cough, shortness of breath or chest pain,  headache, or mood change.    Objective:   No results found. Recent Labs    01/26/24 0504  WBC 9.6  HGB 10.3*  HCT 34.4*  PLT 304   Recent Labs    01/26/24 0504  NA 140  K 4.0  CL 104  CO2 28  GLUCOSE 86  BUN 16  CREATININE 0.62  CALCIUM  9.1    Intake/Output Summary (Last 24 hours) at 01/26/2024 1044 Last data filed at 01/26/2024 0834 Gross per 24 hour  Intake 880 ml  Output --  Net 880 ml        Physical Exam: Vital Signs Blood pressure (!) 152/80, pulse 70, temperature 98.4 F (36.9 C), temperature source Oral, resp. rate 18, height 5\' 2"  (1.575 m), weight 98.6 kg, last menstrual period 11/20/2012, SpO2 96%.    Constitutional: No distress . Vital signs reviewed. HEENT: NCAT, EOMI, oral membranes moist Neck: supple Cardiovascular: RRR without murmur. No JVD    Respiratory/Chest: CTA Bilaterally without wheezes or rales. Normal effort    GI/Abdomen: BS +, non-tender, non-distended Ext: no clubbing, cyanosis, or edema Psych: pleasant and cooperative  Skin: Back incision CDI with puckering around incision Neuro:  Alert and oriented x 3. Normal insight and awareness. Intact Memory. Normal language and speech. Cranial nerve exam unremarkable. MMT: RUE 4+ to 5/5 and LUE 4-/5 to 4+/5 prox to distal, both remain limited by shoulder pain/DJD. RLE 2/5 prox to 2/5 ADF/PF limited by right ankle. LLE 3-/5 HF, KE and 3+ADF/PF. Decreased LT below the umbilicus but senses general touch and pain.. DTR's 1+ Musculoskeletal: DJD/limited ROM both shoulders L>>R. Right ankle with limited  ADF/PF--30-50 degrees perhaps, chronic changes noted.    LB/midback still tender.   AFO at bedside--PL-type, foot plate appears very large.  Assessment/Plan: 1. Functional deficits which require 3+ hours per day of interdisciplinary therapy in a comprehensive inpatient rehab setting. Physiatrist is providing close team supervision and 24 hour management of active medical problems listed below. Physiatrist and rehab team continue to assess barriers to discharge/monitor patient progress toward functional and medical goals  Care Tool:  Bathing        Body parts bathed by helper: Right arm, Left arm, Chest, Abdomen, Front perineal area, Buttocks, Right upper leg, Left upper leg, Face Body parts n/a: Right lower leg, Left lower leg   Bathing assist Assist Level: Minimal Assistance - Patient > 75%     Upper Body Dressing/Undressing Upper body dressing        Upper body assist Assist Level: Supervision/Verbal cueing    Lower Body Dressing/Undressing Lower body dressing      What is the patient wearing?: Pants, Incontinence brief     Lower body assist Assist for lower body dressing: Moderate Assistance - Patient 50 - 74%     Toileting Toileting    Toileting assist Assist for toileting:  Contact Guard/Touching assist     Transfers Chair/bed transfer  Transfers assist     Chair/bed transfer assist level: Minimal Assistance - Patient > 75%     Locomotion Ambulation   Ambulation assist      Assist level: Minimal Assistance - Patient > 75% Assistive device: Walker-rolling Max distance: 20 ft   Walk 10 feet activity   Assist     Assist level: Minimal Assistance - Patient > 75% Assistive device: Walker-rolling   Walk 50 feet activity   Assist Walk 50 feet with 2 turns activity did not occur: Safety/medical concerns         Walk 150 feet activity   Assist Walk 150 feet activity did not occur: Safety/medical concerns         Walk 10 feet on  uneven surface  activity   Assist Walk 10 feet on uneven surfaces activity did not occur: Safety/medical concerns         Wheelchair     Assist Is the patient using a wheelchair?: Yes Type of Wheelchair: Manual    Wheelchair assist level: Dependent - Patient 0%      Wheelchair 50 feet with 2 turns activity    Assist        Assist Level: Dependent - Patient 0%   Wheelchair 150 feet activity     Assist      Assist Level: Dependent - Patient 0%   Blood pressure (!) 152/80, pulse 70, temperature 98.4 F (36.9 C), temperature source Oral, resp. rate 18, height 5\' 2"  (1.575 m), weight 98.6 kg, last menstrual period 11/20/2012, SpO2 96%.  Medical Problem List and Plan: 1. Functional deficits secondary to T9/10 myelopathy d/t severe spinal stenosis  and hx of T9 discitis/osteomyelitis             -patient may  shower--pat wound dry after shower.              -ELOS/Goals: 14-21 days, goals are mod I to supervision/min assist with PT and OT  -Continue CIR therapies including PT, OT   -4/21-AFO delivered, doesn't fit shoe after I was told Thursday it would be further adjusted after they checked on fit. Either way, I spoke with Larinda Plover at [facility] this morning, and someone is coming back to adjust. Foot plate needs to be trimmed. Shoe could also be stretched. I told pt to wait before she goes out and buys new shoes.  2.  Antithrombotics: -DVT/anticoagulation:  Pharmaceutical: Lovenox              -antiplatelet therapy: N/A 3. Chronic Pain/Pain Management: Followed by [facility] on [address]/Dr. Wynelle Heather [facility]) --Gabapentin  1200 mg TID. Tylenol  prn.Parafon  500 mg qid.              --oxycodone  prn. Oxycontin  10 mg bid.  4/17 pain  seems to be under control  4/21--pain present, but she's working through it.  4. Mood/Behavior/Sleep: LCSW to follow for evaluation and support.              -antipsychotic agents: N/A.              - Uses valium  for insomnia?  Panic attacks? --Melatonin prn added-pt hasn't used yet -sleeping better/ See CPAP discussion below 5. Neuropsych/cognition: This patient is capable of making decisions on her own behalf. 6. Skin/Wound Care: vac now dc'ed.  - continue dry dressing (ABD pad) to wound for now. 7. Fluids/Electrolytes/Nutrition: Monitor I/O.               --  Vitamin supplement and prostat to help promote wound healing.    -I personally reviewed the patient's labs today.   8. Enterobacter/MSSA Lumbar infection: Continue Cefepime  while hospitalized and transition to Cipro  500 mg bid/Cefadroxil  1 gram bid at discharge.             --benadryl  50 mg tid prn allergies-->has been using couple of times a dayu             --follow up with Dr. Zelda Hickman  4/17 WBC's up to 12k today.    -she is afebrile, wound looks great   -no other concerning signs or sx at present  4/19- will check Monday 9. Recurrent Hypokalemia:  -4/17 today's level up stable at 3.8              --continue daily supplement   10.H/o IBS- C: Was not taking iron at home--will d/c.             --Black stools due to iron supplement.  -- Continue Amitiza  and order stool guaiacs.  -there is also likely a component of neurogenic bowel as patient was having incontinence PTA (2 years) 4/17 pt with another incontinent bowel movement yesterday.   -will start pm dulcolax suppository tonight  -continue amitiza  and am senna 4/18 LBM yesterday, Bms often documented as hard, add miralax  daily 4/21--LBM 4/20. She is refusing PM suppository.  She feels that continence is better, bowels more regular, will change supp to PRN.  11. HTN: Monitor BP TID --on Amlodipine , tenormin  and Chlorthalidone . --likely need potassium supplement due to chlorthalidone .      01/26/2024    5:06 AM 01/25/2024    7:33 PM 01/25/2024   12:58 PM  Vitals with BMI  Weight 217 lbs 6 oz    BMI 39.75    Systolic 152 154 865  Diastolic 80 72 62  Pulse 70 66 63  Intermittently elevated,  continue to monitor trend for now  4/19- BP running elevated 150's to 170's today- on Atenolol  50 mg daily- if still elevated tomorrow, will increase Atenolol     13. Neurogenic Bladder/ incontinence/frequency: Has been incontinent of bladder at nights and during the day.  -4/16 still no pvr's scanned despite mutliple requests! - I/O cath for vol >350cc -ongoing intermittent incontinence   -4/17 PVR not elevated, will try timed voids, consider anticholinergic  4/21- PVR's all low with one exception. Continue timed voids,   -will add low dose myrbetriq  and see how she does. 14. OSA: Uses CPAP at home-- pt using, she asked family to bring nasal unit from home as she's struggling with nasal mask hospital provided.  -4/18-4/19 Discussed asking family again to try to bring home unit- pt says will try to get nephew to bring in.   4/21 pt has home unit/nasal prongs working much better!     LOS: 7 days A FACE TO FACE EVALUATION WAS PERFORMED  Rawland Caddy 01/26/2024, 10:44 AM

## 2024-01-26 NOTE — Progress Notes (Signed)
 Occupational Therapy Weekly Progress Note  Patient Details  Name: [name] MRN: [id number] Date of Birth: [birth year]  Beginning of progress report period: January 20, 2024 End of progress report period: January 26, 2024  Patient has met 2 of 2 short term goals.  Pt made steady progress with BADLs and funcitonal transfers since admission. Pt completes LB bathing/dressing tasks with max A using AE PRN. Sit<>stand varies from CGA to mod A depending on surface height. Bed mobility with min A using bed rails. Pt has bed rails on bed at home. Functional transfers with CGA using RW. Pt fatigues easily and requires multiple rest breaks during activities. Family has not been present during therapy sessions.   Patient continues to demonstrate the following deficits: {impairments:3041632} and therefore will continue to benefit from skilled OT intervention to enhance overall performance with {ADL/iADL:3041649}.  Patient {LTG progression:3041653}.  {plan of HQIO:9629528}  OT Short Term Goals Week 1:  OT Short Term Goal 1 (Week 1): Pt will complete LB bathing and dressing with Max A and use of AE. OT Short Term Goal 1 - Progress (Week 1): Met OT Short Term Goal 2 (Week 1): Pt will completed toileting with Max A Week 2:  OT Short Term Goal 1 (Week 2): Pt will complete LB bating with mod A using AE PRN OT Short Term Goal 2 (Week 2): Pt will complete LB dressing with mod A using AE PRN OT Short Term Goal 3 (Week 2): Pt will perfrom toilet transfer with supervision   Doak Free 01/26/2024, 6:42 AM

## 2024-01-27 DIAGNOSIS — M4644 Discitis, unspecified, thoracic region: Secondary | ICD-10-CM | POA: Diagnosis not present

## 2024-01-27 LAB — OCCULT BLOOD X 1 CARD TO LAB, STOOL: Fecal Occult Bld: NEGATIVE

## 2024-01-27 MED ORDER — ALTEPLASE 2 MG IJ SOLR
2.0000 mg | Freq: Once | INTRAMUSCULAR | Status: AC
  Administered 2024-01-27: 2 mg
  Filled 2024-01-27: qty 2

## 2024-01-27 MED ORDER — AMLODIPINE BESYLATE 10 MG PO TABS
10.0000 mg | ORAL_TABLET | Freq: Every morning | ORAL | Status: DC
  Administered 2024-01-28 – 2024-02-02 (×6): 10 mg via ORAL
  Filled 2024-01-27 (×6): qty 1

## 2024-01-27 NOTE — Plan of Care (Signed)
  Problem: SCI BLADDER ELIMINATION Goal: RH STG MANAGE BLADDER WITH ASSISTANCE Description: STG Manage Bladder With supervision/mod I Assistance Outcome: Progressing   Problem: RH SKIN INTEGRITY Goal: RH STG SKIN FREE OF INFECTION/BREAKDOWN Outcome: Progressing   Problem: RH SAFETY Goal: RH STG ADHERE TO SAFETY PRECAUTIONS W/ASSISTANCE/DEVICE Description: STG Adhere to Safety Precautions With supervision/ mod I  Assistance/Device. Outcome: Progressing   Problem: RH PAIN MANAGEMENT Goal: RH STG PAIN MANAGED AT OR BELOW PT'S PAIN GOAL Description: <4 w/ prns Outcome: Progressing

## 2024-01-27 NOTE — Progress Notes (Signed)
 Occupational Therapy Note  Patient Details  Name: [name] MRN: [id number] Date of Birth: 1961/05/03  Today's Date: 01/27/2024 OT Missed Time: 75 Minutes Missed Time Reason: Other (comment) (pt c/o n/v)  Pt in bed upon arrival. Pt reports n/v and not feeling well this morning. Unable to actively participate in therapy. Will attempt to see pt again as schedule allows.   Andre Kawasaki [facility] 01/27/2024, 8:48 AM

## 2024-01-27 NOTE — Progress Notes (Signed)
 Physical Therapy Session Note  Patient Details  Name: [name] MRN: [id number] Date of Birth: 01/11/1961  Today's Date: 01/27/2024 PT Individual Time: 8469-6295 PT Individual Time Calculation (min): 42 min   Short Term Goals: Week 1:  PT Short Term Goal 1 (Week 1): pt will perform sit to stand with CGA with LRAD PT Short Term Goal 1 - Progress (Week 1): Met PT Short Term Goal 2 (Week 1): pt will perform bed to chair transfer with CGA and LRAD PT Short Term Goal 2 - Progress (Week 1): Met PT Short Term Goal 3 (Week 1): pt will perform bed mobility with LRAD and CGA PT Short Term Goal 3 - Progress (Week 1): Met PT Short Term Goal 4 (Week 1): pt will ambulate 25 feet with LRAD and CGA PT Short Term Goal 4 - Progress (Week 1): Met  Skilled Therapeutic Interventions/Progress Updates:      Therapist present to make up missed minutes.   Pt supine in bed upon arrival. Pt agreeable to therapy. Pt denies any pain but reports nausea and constipation.   Pt utilized customized binder in room to provide education to pt regarding non pharmacological management of constipation/pain, reviewed and provided pt with "nutrition is medicine" handouts for management of constipation/pain/anxiety/hypertension/weight loss. Emphasized the importance of mobility for management of constipation. Encouraged pt to participate in OOB activity for improved mobility of stool however pt refusing at this time 2/2 nausea. Pt very appreciative of this educational material.   Education provided for SCI support groups. Handouts provided.   During session pt reports need to have a BM. Therapist encouraging pt to transfer to [name], however pt insisting on using bed pan 2/2 urgency.   Pt performed rolling to L with use of bed rail and sup/min A, while therapist placed bed pan.   Notified nurse of pt position. Pt supine in bed at end of session with all needs within reach and bed alarm on.   Therapy  Documentation Precautions:  Precautions Precautions: Fall, Back Precaution Booklet Issued: Yes (comment) Recall of Precautions/Restrictions: Impaired Precaution/Restrictions Comments: Spinal precautions-No back brace required. Required Braces or Orthoses: Other Brace Other Brace: Pt with personal air cast on R ankle. Pt reports ankle instability for ~10 years from previous ankle dislocation. Restrictions Weight Bearing Restrictions Per Provider Order: No   Therapy/Group: Individual Therapy  [facility] [place], [place], DPT  01/27/2024, 3:10 PM

## 2024-01-27 NOTE — Progress Notes (Signed)
 PROGRESS NOTE   Subjective/Complaints:  Pt reports in a lot of pain when sitting on BSC to try and have BM, since cold air blowing on her back-  Stomach a little upset this AM- didn't take gingerale with meds and could only eat banana since nothing else on tray appealing.   Got CPAP- working much better Trying to have BM, but cannot focus on going due to pain.   ROS:   Pt denies SOB, abd pain, CP, N/V/C/D, and vision changes   Objective:   No results found. Recent Labs    01/26/24 0504  WBC 9.6  HGB 10.3*  HCT 34.4*  PLT 304   Recent Labs    01/26/24 0504  NA 140  K 4.0  CL 104  CO2 28  GLUCOSE 86  BUN 16  CREATININE 0.62  CALCIUM  9.1    Intake/Output Summary (Last 24 hours) at 01/27/2024 0842 Last data filed at 01/26/2024 1829 Gross per 24 hour  Intake 200 ml  Output 200 ml  Net 0 ml        Physical Exam: Vital Signs Blood pressure (!) 159/85, pulse 70, temperature 98.3 F (36.8 C), resp. rate 18, height 5\' 2"  (1.575 m), weight 98.6 kg, last menstrual period 11/20/2012, SpO2 98%.     General: awake, alert, appropriate, sitting on BSC right next to bed; swaying back and forth due to pain; NAD HENT: conjugate gaze; oropharynx moist CV: regular rate and rhythm; no JVD Pulmonary: CTA B/L; no W/R/R- good air movement GI: soft, NT, ND, (+)BS- normoactive Psychiatric: appropriate Neurological: Ox3  Ext: no clubbing, cyanosis, or edema Psych: pleasant and cooperative  Skin: Back incision CDI with puckering around incision- has divot in tissue from prior back surgeries, but looks OK- Neuro:  Alert and oriented x 3. Normal insight and awareness. Intact Memory. Normal language and speech. Cranial nerve exam unremarkable. MMT: RUE 4+ to 5/5 and LUE 4-/5 to 4+/5 prox to distal, both remain limited by shoulder pain/DJD. RLE 2/5 prox to 2/5 ADF/PF limited by right ankle. LLE 3-/5 HF, KE and 3+ADF/PF.  Decreased LT below the umbilicus but senses general touch and pain.. DTR's 1+ Musculoskeletal: DJD/limited ROM both shoulders L>>R. Right ankle with limited ADF/PF--30-50 degrees perhaps, chronic changes noted.    LB/midback still tender.   AFO at bedside--PL-type, foot plate appears very large.  Assessment/Plan: 1. Functional deficits which require 3+ hours per day of interdisciplinary therapy in a comprehensive inpatient rehab setting. Physiatrist is providing close team supervision and 24 hour management of active medical problems listed below. Physiatrist and rehab team continue to assess barriers to discharge/monitor patient progress toward functional and medical goals  Care Tool:  Bathing        Body parts bathed by helper: Right arm, Left arm, Chest, Abdomen, Front perineal area, Buttocks, Right upper leg, Left upper leg, Face Body parts n/a: Right lower leg, Left lower leg   Bathing assist Assist Level: Minimal Assistance - Patient > 75%     Upper Body Dressing/Undressing Upper body dressing   What is the patient wearing?: Pull over shirt    Upper body assist Assist Level: Supervision/Verbal cueing  Lower Body Dressing/Undressing Lower body dressing      What is the patient wearing?: Pants, Incontinence brief     Lower body assist Assist for lower body dressing: Minimal Assistance - Patient > 75%     Toileting Toileting    Toileting assist Assist for toileting: Contact Guard/Touching assist     Transfers Chair/bed transfer  Transfers assist     Chair/bed transfer assist level: Minimal Assistance - Patient > 75%     Locomotion Ambulation   Ambulation assist      Assist level: Minimal Assistance - Patient > 75% Assistive device: Walker-rolling Max distance: 20 ft   Walk 10 feet activity   Assist     Assist level: Minimal Assistance - Patient > 75% Assistive device: Walker-rolling   Walk 50 feet activity   Assist Walk 50 feet with 2  turns activity did not occur: Safety/medical concerns         Walk 150 feet activity   Assist Walk 150 feet activity did not occur: Safety/medical concerns         Walk 10 feet on uneven surface  activity   Assist Walk 10 feet on uneven surfaces activity did not occur: Safety/medical concerns         Wheelchair     Assist Is the patient using a wheelchair?: Yes Type of Wheelchair: Manual    Wheelchair assist level: Dependent - Patient 0%      Wheelchair 50 feet with 2 turns activity    Assist        Assist Level: Dependent - Patient 0%   Wheelchair 150 feet activity     Assist      Assist Level: Dependent - Patient 0%   Blood pressure (!) 159/85, pulse 70, temperature 98.3 F (36.8 C), resp. rate 18, height 5\' 2"  (1.575 m), weight 98.6 kg, last menstrual period 11/20/2012, SpO2 98%.  Medical Problem List and Plan: 1. Functional deficits secondary to T9/10 myelopathy d/t severe spinal stenosis  and hx of T9 discitis/osteomyelitis             -patient may  shower--pat wound dry after shower.              -ELOS/Goals: 14-21 days, goals are mod I to supervision/min assist with PT and OT  Con't CIR PT and OT  Team conference today to determine length of stay -4/21-AFO delivered, doesn't fit shoe after I was told Thursday it would be further adjusted after they checked on fit. Either way, I spoke with Larinda Plover at [facility] this morning, and someone is coming back to adjust. Foot plate needs to be trimmed. Shoe could also be stretched. I told pt to wait before she goes out and buys new shoes.   2.  Antithrombotics: -DVT/anticoagulation:  Pharmaceutical: Lovenox              -antiplatelet therapy: N/A 3. Chronic Pain/Pain Management: Followed by [facility] on [address]/Dr. Wynelle Heather [facility]) --Gabapentin  1200 mg TID. Tylenol  prn.Parafon  500 mg qid.              --oxycodone  prn. Oxycontin  10 mg bid.  4/17 pain  seems to be under  control  4/21--pain present, but she's working through it.   4/22- having more pain due to cold air this AM 4. Mood/Behavior/Sleep: LCSW to follow for evaluation and support.              -antipsychotic agents: N/A.              -  Uses valium  for insomnia? Panic attacks? --Melatonin prn added-pt hasn't used yet -sleeping better/ See CPAP discussion below 5. Neuropsych/cognition: This patient is capable of making decisions on her own behalf. 6. Skin/Wound Care: vac now dc'ed.  - continue dry dressing (ABD pad) to wound for now. 4/22- incision looks OK right now- some drainage, localized erythema 7. Fluids/Electrolytes/Nutrition: Monitor I/O.               --Vitamin supplement and prostat to help promote wound healing.    -I personally reviewed the patient's labs today.   8. Enterobacter/MSSA Lumbar infection: Continue Cefepime  while hospitalized and transition to Cipro  500 mg bid/Cefadroxil  1 gram bid at discharge.             --benadryl  50 mg tid prn allergies-->has been using couple of times a dayu             --follow up with Dr. Zelda Hickman  4/17 WBC's up to 12k today.    -she is afebrile, wound looks great   -no other concerning signs or sx at present  4/19- will check Monday  4/22- WBC 9.6 down from 12k 9. Recurrent Hypokalemia:  -4/17 today's level up stable at 3.8              --continue daily supplement   10.H/o IBS- C: Was not taking iron at home--will d/c.             --Black stools due to iron supplement.  -- Continue Amitiza  and order stool guaiacs.  -there is also likely a component of neurogenic bowel as patient was having incontinence PTA (2 years) 4/17 pt with another incontinent bowel movement yesterday.   -will start pm dulcolax suppository tonight  -continue amitiza  and am senna 4/18 LBM yesterday, Bms often documented as hard, add miralax  daily 4/21--LBM 4/20. She is refusing PM suppository.  She feels that continence is better, bowels more regular, will change supp to  PRN 4/22- LBM 2 days ago- asked ofr daily miralax  this AM 11. HTN: Monitor BP TID --on Amlodipine , tenormin  and Chlorthalidone . --likely need potassium supplement due to chlorthalidone .      01/27/2024    6:37 AM 01/27/2024    3:39 AM 01/26/2024    7:46 PM  Vitals with BMI  Systolic 159 159 045  Diastolic 85 85 74  Pulse  70 73  Intermittently elevated, continue to monitor trend for now  4/19- BP running elevated 150's to 170's today- on Atenolol  50 mg daily- if still elevated tomorrow, will increase Atenolol     4/22- BP running 140s-150s still- will increase Norvasc  to 10 mg since HR in low 70's 13. Neurogenic Bladder/ incontinence/frequency: Has been incontinent of bladder at nights and during the day.  -4/16 still no pvr's scanned despite mutliple requests! - I/O cath for vol >350cc -ongoing intermittent incontinence   -4/17 PVR not elevated, will try timed voids, consider anticholinergic  4/21- PVR's all low with one exception. Continue timed voids,   -will add low dose myrbetriq  and see how she does. 4/22- no significant improvement yet per pt 14. OSA: Uses CPAP at home-- pt using, she asked family to bring nasal unit from home as she's struggling with nasal mask hospital provided.  -4/18-4/19 Discussed asking family again to try to bring home unit- pt says will try to get nephew to bring in.   4/21 pt has home unit/nasal prongs working much better!   I spent a total of 42   minutes on  total care today- >50% coordination of care- due to  D/w pt, NT; and review of labs; vitals and changed meds- and team conference today to determine length of stay  LOS: 8 days A FACE TO FACE EVALUATION WAS PERFORMED  Adryan Druckenmiller 01/27/2024, 8:42 AM

## 2024-01-27 NOTE — Progress Notes (Addendum)
 Physical Therapy Weekly Progress Note  Patient Details  Name: [name] MRN: [id number] Date of Birth: 1961-02-15  Beginning of progress report period: January 20, 2024 End of progress report period: January 27, 2024  Today's Date: 01/27/2024 PT Individual Time: 1130-1145, 1330-1408 PT Individual Time Calculation (min): 15 min, 38 min   and Today's Date: 01/27/2024 PT Missed Time: 15 Minutes Missed Time Reason: Patient ill (Comment) (nausea)  Patient has met 4 of 4 short term goals.  Ms. Furnish is progressing steadily to LTGs. She is limited by pain and endurance, and is mobilizing with CGA overall using RW, including gait up to 40 ft. Focus for the coming week will include stair navigation for safety and independence in her home environment.   Patient continues to demonstrate the following deficits muscle weakness, decreased cardiorespiratoy endurance, impaired timing and sequencing, unbalanced muscle activation, and decreased coordination, and decreased standing balance, decreased postural control, decreased balance strategies, and difficulty maintaining precautions and therefore will continue to benefit from skilled PT intervention to increase functional independence with mobility.  Patient progressing toward long term goals..  Continue plan of care.  PT Short Term Goals Week 1:  PT Short Term Goal 1 (Week 1): pt will perform sit to stand with CGA with LRAD PT Short Term Goal 1 - Progress (Week 1): Met PT Short Term Goal 2 (Week 1): pt will perform bed to chair transfer with CGA and LRAD PT Short Term Goal 2 - Progress (Week 1): Met PT Short Term Goal 3 (Week 1): pt will perform bed mobility with LRAD and CGA PT Short Term Goal 3 - Progress (Week 1): Met PT Short Term Goal 4 (Week 1): pt will ambulate 25 feet with LRAD and CGA PT Short Term Goal 4 - Progress (Week 1): Met Week 2:  PT Short Term Goal 1 (Week 2): =LTGs d/t ELOS  Skilled Therapeutic Interventions/Progress Updates:     Session 1: Pt recd in bed, states she is experiencing nausea and constipation and is unable to participate. No c/o of increased pain. Discussed pt condition and provided education on colon stimulation massage to aid in constipation management. Also discussed assistive devices, pt states she has ordered rollator. Provided education on needing clearance from this therapist or next PT to begin using rollator and that this therapist will likely recommend RW for safety at time of d/c. Pt expressed understanding. Pt missed x 15 min d/t nausea and constipation. Will attempt to follow up as able.   Session 2: Pt recd in bed and continues to decline OOB mobility d/t nausea.  No c/o of increased pain. Session focused on pt education with emphasis on constipation management, general nutrition for healing/balanced diet, stair navigation, return to water aerobics, general activity recommendations after d/c, and return to walking her dog. Pt expressed understanding and declined any further interventions at this time. Pt was left with all needs in reach and alarm active.   Therapy Documentation Precautions:  Precautions Precautions: Fall, Back Precaution Booklet Issued: Yes (comment) Recall of Precautions/Restrictions: Impaired Precaution/Restrictions Comments: Spinal precautions-No back brace required. Required Braces or Orthoses: Other Brace Other Brace: Pt with personal air cast on R ankle. Pt reports ankle instability for ~10 years from previous ankle dislocation. Restrictions Weight Bearing Restrictions Per Provider Order: No General:      Therapy/Group: Individual Therapy  Tex Filbert 01/27/2024, 12:09 PM

## 2024-01-27 NOTE — Progress Notes (Signed)
 Occupational Therapy Note  Patient Details  Name: KANYAH MATSUSHIMA MRN: [id number] Date of Birth: 01/04/1961  Today's Date: 01/27/2024 OT Missed Time: 45 Minutes Missed Time Reason: Patient ill (comment) (pt too nauseated and declined therapy session)  Pt stated she had to miss her earlier OT session and needs to miss this one also as she is feeling very nauseated.  Pt declined to participate as she stated "it is a BAD morning for me".   Leigh Kaeding 01/27/2024, 10:16 AM

## 2024-01-28 DIAGNOSIS — M4644 Discitis, unspecified, thoracic region: Secondary | ICD-10-CM | POA: Diagnosis not present

## 2024-01-28 MED ORDER — GERHARDT'S BUTT CREAM
TOPICAL_CREAM | Freq: Two times a day (BID) | CUTANEOUS | Status: DC
  Administered 2024-01-29: 1 via TOPICAL
  Filled 2024-01-28: qty 60

## 2024-01-28 MED ORDER — LORATADINE 10 MG PO TABS
10.0000 mg | ORAL_TABLET | Freq: Every day | ORAL | Status: DC
  Administered 2024-01-28 – 2024-02-02 (×6): 10 mg via ORAL
  Filled 2024-01-28 (×6): qty 1

## 2024-01-28 NOTE — Progress Notes (Signed)
 Occupational Therapy Session Note  Patient Details  Name: [name] MRN: [id number] Date of Birth: [birth year]  Today's Date: 01/28/2024 OT Individual Time: 0930-1015 and 1100-1158 OT Individual Time Calculation (min): 45 min and 58 min   Short Term Goals: Week 1:  OT Short Term Goal 1 (Week 1): Pt will complete LB bathing and dressing with Max A and use of AE. OT Short Term Goal 1 - Progress (Week 1): Met OT Short Term Goal 2 (Week 1): Pt will completed toileting with Max A Week 2:  OT Short Term Goal 1 (Week 2): Pt will complete LB bating with mod A using AE PRN OT Short Term Goal 2 (Week 2): Pt will complete LB dressing with mod A using AE PRN OT Short Term Goal 3 (Week 2): Pt will perfrom toilet transfer with supervision     Skilled Therapeutic Interventions/Progress Updates:    Visit 1:  pain:  no c/o pain  Pt received in hand off from PTA. Pt needed to toilet.  PTA set up [facility] over toilet.  Pt stood from wc and used RW to ambulated to toilet with light CGA.   Pt able to sit to stand with supervision.  Pt able to doff clothing over hips.  Pt able to cleanse front area but needs A with posterior side due to back precautions. She does need A to fully pull pants up.  Pt stated she will have A at home but will introduce pt to toilet aides.  Pt ambulated out of bathroom to recliner in room, taking caution to step her R foot forward carefully. Pt sat in recliner and OT retrieved toilet aides to demonstrate to pt. Pt on phone with family member. Will resume training at next session.  Visit 2:  Pain:  "I have some burning and I believe I have some medication scheduled soon"  Pt received in recliner and her nephews were entering the room as I arrived with a new upright walker.  They assisted with adjusting walker pads to the appropriate height. Pt worked on sit to stand from [employer] with close S to light CGA with cue for L foot placement to give her a better BOS with R AFO on.  Pt  practiced ambulating short distance with upright walker.  Pt needed cues for small steps as the larger R step caused her foot to cross.  I would like PT to assess ambulation with the upright walker as it does move much faster than her standard and it does not seem as stable, although the pt does like it.   Recommended she use RW in the home especially with bathroom transfers.  Pt practiced simulated reach with both toilet tongs and with plastic toilet aid. Pt prefers the toilet aid and is able to reach her back side while adhering to precautions.   Told her where she can order one for home.  Discussed cleansing strategies and then pt practiced.   Pt did ambulate with RW to toilet to use bathroom again with only min A to adjust pants over back of hips for the last 20% of the lift.   Pt walked to sink to wash hands and sat in recliner at end of session. All needs met.    Therapy Documentation Precautions:  Precautions Precautions: Fall, Back Precaution Booklet Issued: Yes (comment) Recall of Precautions/Restrictions: Impaired Precaution/Restrictions Comments: Spinal precautions-No back brace required. Required Braces or Orthoses: Other Brace Other Brace: Pt with personal air cast on R ankle.  Pt reports ankle instability for ~10 years from previous ankle dislocation. Restrictions Weight Bearing Restrictions Per Provider Order: No ADL: ADL Eating: Independent Where Assessed-Eating: Bed level Grooming: Setup Where Assessed-Grooming: Sitting at sink Upper Body Bathing: Setup Where Assessed-Upper Body Bathing: Sitting at sink Lower Body Bathing: Dependent Where Assessed-Lower Body Bathing: Sitting at sink Upper Body Dressing: Setup Where Assessed-Upper Body Dressing: Edge of bed Lower Body Dressing: Dependent Where Assessed-Lower Body Dressing: Edge of bed Toileting: Dependent Where Assessed-Toileting: Bedside Commode Toilet Transfer: Minimal assistance Toilet Transfer Method: Stand  pivot Acupuncturist: Animator Transfer: Not assessed Film/video editor: Not assessed   Therapy/Group: Individual Therapy  Roland Lipke 01/28/2024, 10:30 AM

## 2024-01-28 NOTE — Plan of Care (Signed)
  Problem: SCI BOWEL ELIMINATION Goal: RH STG MANAGE BOWEL WITH ASSISTANCE Description: STG Manage Bowel with supervision/mod I Assistance. Outcome: Progressing   Problem: SCI BLADDER ELIMINATION Goal: RH STG MANAGE BLADDER WITH ASSISTANCE Description: STG Manage Bladder With supervision/mod I Assistance Outcome: Progressing   Problem: RH SKIN INTEGRITY Goal: RH STG SKIN FREE OF INFECTION/BREAKDOWN Outcome: Progressing   Problem: RH SAFETY Goal: RH STG ADHERE TO SAFETY PRECAUTIONS W/ASSISTANCE/DEVICE Description: STG Adhere to Safety Precautions With supervision/ mod I  Assistance/Device. Outcome: Progressing

## 2024-01-28 NOTE — Progress Notes (Signed)
 Occupational Therapy Session Note  Patient Details  Name: [name] MRN: [id number] Date of Birth: 06-Jun-1961  Today's Date: 01/28/2024 OT Individual Time: 1400-1445 OT Individual Time Calculation (min): 45 min    Short Term Goals: Week 2:  OT Short Term Goal 1 (Week 2): Pt will complete LB bating with mod A using AE PRN OT Short Term Goal 2 (Week 2): Pt will complete LB dressing with mod A using AE PRN OT Short Term Goal 3 (Week 2): Pt will perfrom toilet transfer with supervision  Skilled Therapeutic Interventions/Progress Updates:      Therapy Documentation Precautions:  Precautions Precautions: Fall, Back Precaution Booklet Issued: Yes (comment) Recall of Precautions/Restrictions: Impaired Precaution/Restrictions Comments: Spinal precautions-No back brace required. Required Braces or Orthoses: Other Brace Other Brace: Pt with personal air cast on R ankle. Pt reports ankle instability for ~10 years from previous ankle dislocation. Restrictions Weight Bearing Restrictions Per Provider Order: No General: "It was so nice to meet you" Pt seated in recliner upon OT arrival, agreeable to OT.  Pain: no pain reported  Exercises: OT providing skilled intervention for UE exercising d/t pt reporting wanting to strength BUE for ADLs and functional mobility. Pt completed the following exercise circuit in order to improve functional activity, strength and endurance to prepare for ADLs such as bathing. Pt completed the following exercises in seated position with no noted LOB/SOB and 3x10 repetitions on each exercise with red theraband: -bicep curls -triceps extensions -External rotation -shoulder flexion -diagonal shoulder raises   Other Treatments: OT providing therapeutic use of self in order to build rapport and discuss patient current situation and goals for therapy. OT assisted pt with skin check for new bracing for RLE, no redness/irritation reported or noted.    Pt  seated in recliner at end of session with W/C alarm donned, call light within reach and 4Ps assessed.    Therapy/Group: Individual Therapy  Nila Barth, OTD, OTR/L 01/28/2024, 4:00 PM

## 2024-01-28 NOTE — Progress Notes (Signed)
 PROGRESS NOTE   Subjective/Complaints:  Pt reports nausea yesterday stopped her from doing therapy- had 6 stools after had hot chocolate for lunch- and got cleaned out- had incontinence.   Nauseated until cleaned out  Cannot use hospital briefs- allergic- using home pull ups.  More pain in LUE this AM esp in L triceps- cannot palpate anything specific, but just having more soreness/pain.  Notes PICC line dressing is coming up , even though changed yesterday.      ROS:   Pt denies SOB, abd pain, CP, N/V/C/D, and vision changes   Objective:   No results found. Recent Labs    01/26/24 0504  WBC 9.6  HGB 10.3*  HCT 34.4*  PLT 304   Recent Labs    01/26/24 0504  NA 140  K 4.0  CL 104  CO2 28  GLUCOSE 86  BUN 16  CREATININE 0.62  CALCIUM  9.1    Intake/Output Summary (Last 24 hours) at 01/28/2024 0803 Last data filed at 01/28/2024 0729 Gross per 24 hour  Intake 840 ml  Output --  Net 840 ml        Physical Exam: Vital Signs Blood pressure (!) 160/71, pulse 63, temperature 98.5 F (36.9 C), resp. rate 18, height 5\' 2"  (1.575 m), weight 93.7 kg, last menstrual period 11/20/2012, SpO2 100%.      General: awake, alert, appropriate,  sitting up in bed; NAD HENT: conjugate gaze; oropharynx moist CV: regular rate and rhythm; no JVD Pulmonary: CTA B/L; no W/R/R- good air movement- Abd: soft, NT, ND, (+)BS- much moe normoactive Psychiatric: appropriate; very interactive Neurological: Ox3 Skin- her PICC line dressing os peeling off skin on lower edge- up by 25-30%  Ext: no clubbing, cyanosis, or edema Psych: pleasant and cooperative  Skin: Back incision CDI with puckering around incision- has divot in tissue from prior back surgeries, but looks OK- Neuro:  Alert and oriented x 3. Normal insight and awareness. Intact Memory. Normal language and speech. Cranial nerve exam unremarkable. MMT: RUE 4+ to 5/5  and LUE 4-/5 to 4+/5 prox to distal, both remain limited by shoulder pain/DJD. RLE 2/5 prox to 2/5 ADF/PF limited by right ankle. LLE 3-/5 HF, KE and 3+ADF/PF. Decreased LT below the umbilicus but senses general touch and pain.. DTR's 1+ Musculoskeletal: DJD/limited ROM both shoulders L>>R. Right ankle with limited ADF/PF--30-50 degrees perhaps, chronic changes noted.    LB/midback still tender.   AFO at bedside--PL-type, foot plate appears very large.  Assessment/Plan: 1. Functional deficits which require 3+ hours per day of interdisciplinary therapy in a comprehensive inpatient rehab setting. Physiatrist is providing close team supervision and 24 hour management of active medical problems listed below. Physiatrist and rehab team continue to assess barriers to discharge/monitor patient progress toward functional and medical goals  Care Tool:  Bathing        Body parts bathed by helper: Right arm, Left arm, Chest, Abdomen, Front perineal area, Buttocks, Right upper leg, Left upper leg, Face Body parts n/a: Right lower leg, Left lower leg   Bathing assist Assist Level: Minimal Assistance - Patient > 75%     Upper Body Dressing/Undressing Upper body dressing  What is the patient wearing?: Pull over shirt    Upper body assist Assist Level: Supervision/Verbal cueing    Lower Body Dressing/Undressing Lower body dressing      What is the patient wearing?: Pants, Incontinence brief     Lower body assist Assist for lower body dressing: Minimal Assistance - Patient > 75%     Toileting Toileting    Toileting assist Assist for toileting: Contact Guard/Touching assist     Transfers Chair/bed transfer  Transfers assist     Chair/bed transfer assist level: Minimal Assistance - Patient > 75%     Locomotion Ambulation   Ambulation assist      Assist level: Minimal Assistance - Patient > 75% Assistive device: Walker-rolling Max distance: 20 ft   Walk 10 feet  activity   Assist     Assist level: Minimal Assistance - Patient > 75% Assistive device: Walker-rolling   Walk 50 feet activity   Assist Walk 50 feet with 2 turns activity did not occur: Safety/medical concerns         Walk 150 feet activity   Assist Walk 150 feet activity did not occur: Safety/medical concerns         Walk 10 feet on uneven surface  activity   Assist Walk 10 feet on uneven surfaces activity did not occur: Safety/medical concerns         Wheelchair     Assist Is the patient using a wheelchair?: Yes Type of Wheelchair: Manual    Wheelchair assist level: Dependent - Patient 0%      Wheelchair 50 feet with 2 turns activity    Assist        Assist Level: Dependent - Patient 0%   Wheelchair 150 feet activity     Assist      Assist Level: Dependent - Patient 0%   Blood pressure (!) 160/71, pulse 63, temperature 98.5 F (36.9 C), resp. rate 18, height 5\' 2"  (1.575 m), weight 93.7 kg, last menstrual period 11/20/2012, SpO2 100%.  Medical Problem List and Plan: 1. Functional deficits secondary to T9/10 myelopathy d/t severe spinal stenosis  and hx of T9 discitis/osteomyelitis             -patient may  shower--pat wound dry after shower.              -ELOS/Goals: 14-21 days, goals are mod I to supervision/min assist with PT and OT  D/c 4/28 Con't CIR PT and OT -4/21-AFO delivered, doesn't fit shoe after I was told Thursday it would be further adjusted after they checked on fit. Either way, I spoke with Larinda Plover at [facility] this morning, and someone is coming back to adjust. Foot plate needs to be trimmed. Shoe could also be stretched. I told pt to wait before she goes out and buys new shoes. 4/23- Per therapy, air splint does well and doesn't see reason need AFO?  2.  Antithrombotics: -DVT/anticoagulation:  Pharmaceutical: Lovenox              -antiplatelet therapy: N/A 3. Chronic Pain/Pain Management: Followed by [facility] on [address]/Dr. Wynelle Heather [facility] [place]) --Gabapentin  1200 mg TID. Tylenol  prn.Parafon  500 mg qid.              --oxycodone  prn. Oxycontin  10 mg bid.  4/17 pain  seems to be under control  4/21--pain present, but she's working through it.   4/22- having more pain due to cold air this AM  4/23- More pain in LUE,  but hasn't gotten meds yet this AM 4. Mood/Behavior/Sleep: LCSW to follow for evaluation and support.              -antipsychotic agents: N/A.              - Uses valium  for insomnia? Panic attacks? --Melatonin prn added-pt hasn't used yet -sleeping better/ See CPAP discussion below 5. Neuropsych/cognition: This patient is capable of making decisions on her own behalf. 6. Skin/Wound Care: vac now dc'ed.  - continue dry dressing (ABD pad) to wound for now. 4/22- incision looks OK right now- some drainage, localized erythema 4/23- needs PICC line dressing changed again since it's open- let nursing know needs to be changed! Added gerhardt's per nursing request.  7. Fluids/Electrolytes/Nutrition: Monitor I/O.               --Vitamin supplement and prostat to help promote wound healing.    -I personally reviewed the patient's labs today.   8. Enterobacter/MSSA Lumbar infection: Continue Cefepime  while hospitalized and transition to Cipro  500 mg bid/Cefadroxil  1 gram bid at discharge.             --benadryl  50 mg tid prn allergies-->has been using couple of times a dayu             --follow up with Dr. Zelda Hickman  4/17 WBC's up to 12k today.    -she is afebrile, wound looks great   -no other concerning signs or sx at present  4/19- will check Monday  4/22- WBC 9.6 down from 12k 9. Recurrent Hypokalemia:  -4/17 today's level up stable at 3.8              --continue daily supplement   10.H/o IBS- C: Was not taking iron at home--will d/c.             --Black stools due to iron supplement.  -- Continue Amitiza  and order stool guaiacs.  -there is also likely a component of neurogenic  bowel as patient was having incontinence PTA (2 years) 4/17 pt with another incontinent bowel movement yesterday.   -will start pm dulcolax suppository tonight  -continue amitiza  and am senna 4/18 LBM yesterday, Bms often documented as hard, add miralax  daily 4/21--LBM 4/20. She is refusing PM suppository.  She feels that continence is better, bowels more regular, will change supp to PRN 4/22- LBM 2 days ago- asked ofr daily miralax  this AM 4/23- LBM x6 yesterday- feeling much better 11. HTN: Monitor BP TID --on Amlodipine , tenormin  and Chlorthalidone . --likely need potassium supplement due to chlorthalidone .      01/28/2024    6:17 AM 01/28/2024    6:15 AM 01/28/2024    4:42 AM  Vitals with BMI  Weight  206 lbs 9 oz   BMI  37.77   Systolic 160  160  Diastolic 71  71  Pulse   63  Intermittently elevated, continue to monitor trend for now  4/19- BP running elevated 150's to 170's today- on Atenolol  50 mg daily- if still elevated tomorrow, will increase Atenolol     4/22- BP running 140s-150s still- will increase Norvasc  to 10 mg since HR in low 70's  4/23- Norvasc  increase starting this AM 13. Neurogenic Bladder/ incontinence/frequency: Has been incontinent of bladder at nights and during the day.  -4/16 still no pvr's scanned despite mutliple requests! - I/O cath for vol >350cc -ongoing intermittent incontinence   -4/17 PVR not elevated, will try timed voids, consider anticholinergic  4/21- PVR's all  low with one exception. Continue timed voids,   -will add low dose myrbetriq  and see how she does. 4/22- no significant improvement yet per pt 4/23- pt has chronic OAB 14. OSA: Uses CPAP at home-- pt using, she asked family to bring nasal unit from home as she's struggling with nasal mask hospital provided.  -4/18-4/19 Discussed asking family again to try to bring home unit- pt says will try to get nephew to bring in.   4/21 pt has home unit/nasal prongs working much better!  4/23-  slept 7 hours last night! 15. Allergies  4/23- asking for Benadryl  q6 hours scheduled- will try Claritin  daily first.    I spent a total of 38   minutes on total care today- >50% coordination of care- due to  D/w pt at length about medical issues- added Gerhardt's Claritin , d/w nursing about her PICC line.    LOS: 9 days A FACE TO FACE EVALUATION WAS PERFORMED  Jehiel Koepp 01/28/2024, 8:03 AM

## 2024-01-28 NOTE — Progress Notes (Signed)
 Pt utilizing her home CPAP water added to chamber for her.

## 2024-01-28 NOTE — Progress Notes (Signed)
   01/28/24 2245  BiPAP/CPAP/SIPAP  BiPAP/CPAP/SIPAP Pt Type Adult  Mask Type  (nasal pillows)  Dentures removed? Not applicable  FiO2 (%) 21 %  Patient Home Machine Yes  Patient Home Mask Yes  Patient Home Tubing Yes  Device Plugged into RED Power Outlet Yes  BiPAP/CPAP /SiPAP Vitals  Pulse Rate 78  Resp 20  BP (!) 149/73  SpO2 99 %  MEWS Score/Color  MEWS Score 0  MEWS Score Color Green   Pt wearing her home CPAP

## 2024-01-28 NOTE — Patient Care Conference (Signed)
 Inpatient RehabilitationTeam Conference and Plan of Care Update Date: 01/27/2024   Time: 1114 am     Patient Name: [name]      Medical Record Number: [id number]  Date of Birth: [birth year] Sex: Female         Room/Bed: 4W25C/4W25C-01 Payor Info: Payor: [employer] copy[employer] MEDICARE / Plan: Sandford Croon DUAL COMPLETE / Product Type: *No Product type* /    Admit Date/Time:  01/19/2024  5:16 PM  Primary Diagnosis:  Thoracic discitis  Hospital Problems: Principal Problem:   Thoracic discitis Active Problems:   Thoracic myelopathy   Neurogenic bowel   Neurogenic bladder    Expected Discharge Date: Expected Discharge Date: 02/02/24  Team Members Present: Physician leading conference: Dr. Celia Coles Social Worker Present: Norval Been, LCSWA Nurse Present: Jerene Monks, RN PT Present: Aundria Leech, PT OT Present: [name], OT     Current Status/Progress Goal Weekly Team Focus  Bowel/Bladder   Currently continent B/B with incontinence episode LBM 01/25/24   Will regain full B/B continence   Assist with toileting needs qshift/prn    Swallow/Nutrition/ Hydration               ADL's   bathing-min A; UB dressing-supervision; LB dressing-min A overall (tot A for donning AFO and shoes); amb/transfers with RW-CGA   Mod I overall   BADLs, LB dressing, activity tolerance, safety awareness    Mobility   CGA to min a for all mobility, limited by pain. gait with knee hyperextension thrust noted d/t LE weakness   mod I overall  gait, transfers, LE strength    Communication                Safety/Cognition/ Behavioral Observations               Pain   verbalizes pain to lower back 10/10 on pain scale. Prn and scheduled pain meds to mamanage pain   Will verbalizes pain <3   Assess pain qshift/prn and provide education on pain meds    Skin   Surgical incision to lower back.   Will maintain skin intergrity with no breakdown  Assess skin for  breakdown qshift/prn      Discharge Planning:  Pt will d/c to home alone with PRN asst for IADLs, and will need otbe MOD I at d/c. [facility]. SW will confirm there are no barriers to discharge.    Team Discussion: Patient was admitted post T9/10 myelopathy d/t severe spinal stenosis and hx of T9 discitis/osteomyelitis. Patient has high blood pressure, pain : medication adjusted by MD. Patient limited by  lower extremities edema, nausea.   Patient on target to meet rehab goals: yes, Patient requires supervision  with upper body care and minimal assistance with lower body care. Patient requires total assistance for donning AFO and shoes.  Patient requires CGA with transfers and ambulation using a RW. Overall goals at discharge are set for Mod I.   *See Care Plan and progress notes for long and short-term goals.   Revisions to Treatment Plan:  AFO   Teaching Needs: Safety, medications, transfers, toileting, etc   Current Barriers to Discharge: Decreased caregiver support, IV antibiotics, Incontinence, and Weight  Possible Resolutions to Barriers: Family Education  Home health follow-up     Medical Summary Current Status: has overactive bladder- discitis- inconitnent of bladder- on oxycodone - and IV ABX-  Barriers to Discharge: Infection/IV Antibiotics;Behavior/Mood;Medical stability;Incontinence;Self-care education;Weight bearing restrictions;Morbid Obesity;Complicated Wound;Uncontrolled Pain;Neurogenic Bowel & Bladder  Barriers to Discharge Comments:  self limiting- even on incontinence- nausea; hyperextension B/L knees; limited by chronic and back pain- going home alone Possible Resolutions to [employer] Focus: trying to get AFO?- looks good with aircast-no change in pain meds- needs to have BM- d/c date-  d/c 4/28   Continued Need for Acute Rehabilitation Level of Care: The patient requires daily medical management by a physician with specialized training in physical  medicine and rehabilitation for the following reasons: Direction of a multidisciplinary physical rehabilitation program to maximize functional independence : Yes Medical management of patient stability for increased activity during participation in an intensive rehabilitation regime.: Yes Analysis of laboratory values and/or radiology reports with any subsequent need for medication adjustment and/or medical intervention. : Yes   I attest that I was present, lead the team conference, and concur with the assessment and plan of the team.   Jerene Monks 01/27/2024, 1114 am

## 2024-01-28 NOTE — Progress Notes (Signed)
 Patient ID: [name], female   DOB: 06/07/1961, 63 y.o.   MRN: [id number]  SW met with pt in room to provide updates from team conference, and transportation home. SW will confirm if d/c date has changed from 4/29 to 4/28.  Norval Been, MSW, LCSW Office: [phone] Cell: [phone] Fax: [phone]

## 2024-01-28 NOTE — Progress Notes (Signed)
 Physical Therapy Session Note  Patient Details  Name: [name] MRN: [id number] Date of Birth: January 26, 1961  Today's Date: 01/28/2024 PT Individual Time: 4098-1191 and 1305-1340 PT Individual Time Calculation (min): 45 min and 35 min  Short Term Goals: Week 2:  PT Short Term Goal 1 (Week 2): =LTGs d/t ELOS  Skilled Therapeutic Interventions/Progress Updates: Pt presented in bed agreeable to therapy. Olivia from [facility] present to check AFO. Pt denies pain at start of session. Pt completed supine to sit with supervision, increased time and use of bed features. PTA donned AFO/shoes total A for time management. Pt requesting bed to be elevated slightly for first stand with pt completing Sit to stand with CGA. Pt then ambulated ~67ft with RW and CGA. Pt required min cues to improved R foot alignment but had good safety awareness of foot placement. Pt transported remaining distance to main gym for time management. Pt then worked on toe taps to 6 in step in preparation for stair training and increased hip flexor recruitment. Pt completed 2 x 10 alternating toe taps with seated breat between bouts. Pt then ambulated an additional 36ft with RW in same manner as prior. Pt transported remaining distance back to room and pt requesting to transfer to toilet. Pt handed off to OT for next session.   Tx2: Pt presented in recliner agreeable to therapy. Pt noted now has upright rollator which she tried to OT earlier in day. Pt denies pain during session and PTA agreeable to trial rollator to discuss if currently safe or not. Pt completed Sit to stand with CGA and increased time. PTA noted that pt would push off from recliner and would reach for rollator with better anterior weight shifting than when performed with RW. Pt then ambulated 68ft with CGA! Pt then transported remaining distance to main gym and initiated stair training. Pt was able to complete x 2 steps with modA however noted SIGNIFICANT use of BUE to  pull self up with rails. Pt then attempted x 1 lateral step however noted continued to primarily pull self up vs using BLE. Discussed will continue to work on stairs training in future sessions with pt agreeable. Pt then taken back to room and pt requesting to use bathroom. Pt completed ambulatory transfer w/c to toilet with RW and CGA overall. Pt was able to complete LB clothing management with CGA. Pt left at toilet at end of session verbalizing understanding to use call bell with NT notified of pt's disposition.      Therapy Documentation Precautions:  Precautions Precautions: Fall, Back Precaution Booklet Issued: Yes (comment) Recall of Precautions/Restrictions: Impaired Precaution/Restrictions Comments: Spinal precautions-No back brace required. Required Braces or Orthoses: Other Brace Other Brace: Pt with personal air cast on R ankle. Pt reports ankle instability for ~10 years from previous ankle dislocation. Restrictions Weight Bearing Restrictions Per Provider Order: No General:   Vital Signs:   Pain:   Mobility:   Locomotion :    Trunk/Postural Assessment :    Balance:   Exercises:   Other Treatments:      Therapy/Group: Individual Therapy  Aneesh Faller 01/28/2024, 4:24 PM

## 2024-01-28 NOTE — Progress Notes (Signed)
 Received consult to change dressing. Dressing found to be not intact with medial side completely off of patient. Changed dressing without incident

## 2024-01-29 ENCOUNTER — Other Ambulatory Visit (HOSPITAL_COMMUNITY): Payer: Self-pay

## 2024-01-29 DIAGNOSIS — M4644 Discitis, unspecified, thoracic region: Secondary | ICD-10-CM | POA: Diagnosis not present

## 2024-01-29 LAB — BASIC METABOLIC PANEL WITH GFR
Anion gap: 10 (ref 5–15)
BUN: 17 mg/dL (ref 8–23)
CO2: 27 mmol/L (ref 22–32)
Calcium: 9.2 mg/dL (ref 8.9–10.3)
Chloride: 102 mmol/L (ref 98–111)
Creatinine, Ser: 0.62 mg/dL (ref 0.44–1.00)
GFR, Estimated: >60 mL/min (ref >60)
Glucose, Bld: 104 mg/dL — ABNORMAL HIGH (ref 70–99)
Potassium: 4.1 mmol/L (ref 3.5–5.1)
Sodium: 139 mmol/L (ref 135–145)

## 2024-01-29 LAB — CBC
HCT: 37 % (ref 36.0–46.0)
Hemoglobin: 11.2 g/dL — ABNORMAL LOW (ref 12.0–15.0)
MCH: 25.1 pg — ABNORMAL LOW (ref 26.0–34.0)
MCHC: 30.3 g/dL (ref 30.0–36.0)
MCV: 83 fL (ref 80.0–100.0)
Platelets: 282 10*3/uL (ref 150–400)
RBC: 4.46 MIL/uL (ref 3.87–5.11)
RDW: 14.7 % (ref 11.5–15.5)
WBC: 10 10*3/uL (ref 4.0–10.5)
nRBC: 0 % (ref 0.0–0.2)

## 2024-01-29 NOTE — Progress Notes (Signed)
 PROGRESS NOTE   Subjective/Complaints:  Pt reports feeling good EXCEPT for LLE edema yesterday- admits leg was not elevated all afternoon and sat in bedside chair til 8pm. Was eventually elevated, but for a few hours, not all afternoon.   Also having more burning pain in LLE and overall pain in LLE this AM Walked 88 ft with RW- and some with rolator yesterday -total.  Saidd velcro won't stay on R AFO- but when wore, was "night and day".      ROS:    Pt denies SOB, abd pain, CP, N/V/C/D, and vision changes   Objective:   No results found. Recent Labs    01/29/24 0518  WBC 10.0  HGB 11.2*  HCT 37.0  PLT 282   Recent Labs    01/29/24 0518  NA 139  K 4.1  CL 102  CO2 27  GLUCOSE 104*  BUN 17  CREATININE 0.62  CALCIUM  9.2    Intake/Output Summary (Last 24 hours) at 01/29/2024 0834 Last data filed at 01/29/2024 0757 Gross per 24 hour  Intake 2480 ml  Output --  Net 2480 ml        Physical Exam: Vital Signs Blood pressure 138/80, pulse 63, temperature 97.7 F (36.5 C), resp. rate 18, height 5\' 2"  (1.575 m), weight 95.2 kg, last menstrual period 11/20/2012, SpO2 98%.       General: awake, alert, appropriate, sitting up in bed; OT in room; assessed pt's Rolator and AFO; NAD HENT: conjugate gaze; oropharynx moist CV: regular rate and rhythm; no JVD Pulmonary: CTA B/L; no W/R/R- good air movement GI: soft, NT, ND, (+)BS- normoactive Psychiatric: appropriate- very interactive Extremities: trace to 1+ LLE to distal calf  Ext: no clubbing, cyanosis, or edema Psych: pleasant and cooperative  Skin: Back incision CDI with puckering around incision- has divot in tissue from prior back surgeries, but looks OK- Neuro:  Alert and oriented x 3. Normal insight and awareness. Intact Memory. Normal language and speech. Cranial nerve exam unremarkable. MMT: RUE 4+ to 5/5 and LUE 4-/5 to 4+/5 prox to distal, both  remain limited by shoulder pain/DJD. RLE 2/5 prox to 2/5 ADF/PF limited by right ankle. LLE 3-/5 HF, KE and 3+ADF/PF. Decreased LT below the umbilicus but senses general touch and pain.. DTR's 1+ Musculoskeletal: DJD/limited ROM both shoulders L>>R. Right ankle with limited ADF/PF--30-50 degrees perhaps, chronic changes noted.    LB/midback still tender.   AFO at bedside--PL-type, foot plate appears very large.  Assessment/Plan: 1. Functional deficits which require 3+ hours per day of interdisciplinary therapy in a comprehensive inpatient rehab setting. Physiatrist is providing close team supervision and 24 hour management of active medical problems listed below. Physiatrist and rehab team continue to assess barriers to discharge/monitor patient progress toward functional and medical goals  Care Tool:  Bathing        Body parts bathed by helper: Right arm, Left arm, Chest, Abdomen, Front perineal area, Buttocks, Right upper leg, Left upper leg, Face Body parts n/a: Right lower leg, Left lower leg   Bathing assist Assist Level: Minimal Assistance - Patient > 75%     Upper Body Dressing/Undressing Upper body dressing  What is the patient wearing?: Pull over shirt    Upper body assist Assist Level: Supervision/Verbal cueing    Lower Body Dressing/Undressing Lower body dressing      What is the patient wearing?: Pants, Incontinence brief     Lower body assist Assist for lower body dressing: Minimal Assistance - Patient > 75%     Toileting Toileting    Toileting assist Assist for toileting: Contact Guard/Touching assist     Transfers Chair/bed transfer  Transfers assist     Chair/bed transfer assist level: Minimal Assistance - Patient > 75%     Locomotion Ambulation   Ambulation assist      Assist level: Minimal Assistance - Patient > 75% Assistive device: Walker-rolling Max distance: 20 ft   Walk 10 feet activity   Assist     Assist level: Minimal  Assistance - Patient > 75% Assistive device: Walker-rolling   Walk 50 feet activity   Assist Walk 50 feet with 2 turns activity did not occur: Safety/medical concerns         Walk 150 feet activity   Assist Walk 150 feet activity did not occur: Safety/medical concerns         Walk 10 feet on uneven surface  activity   Assist Walk 10 feet on uneven surfaces activity did not occur: Safety/medical concerns         Wheelchair     Assist Is the patient using a wheelchair?: Yes Type of Wheelchair: Manual    Wheelchair assist level: Dependent - Patient 0%      Wheelchair 50 feet with 2 turns activity    Assist        Assist Level: Dependent - Patient 0%   Wheelchair 150 feet activity     Assist      Assist Level: Dependent - Patient 0%   Blood pressure 138/80, pulse 63, temperature 97.7 F (36.5 C), resp. rate 18, height 5\' 2"  (1.575 m), weight 95.2 kg, last menstrual period 11/20/2012, SpO2 98%.  Medical Problem List and Plan: 1. Functional deficits secondary to T9/10 myelopathy d/t severe spinal stenosis  and hx of T9 discitis/osteomyelitis             -patient may  shower--pat wound dry after shower.              -ELOS/Goals: 14-21 days, goals are mod I to supervision/min assist with PT and OT  D/c 4/28 Con't CIR PT and OT  -has an upright rolator- that has elbow rests and brakes up high -4/21-AFO delivered, doesn't fit shoe after I was told Thursday it would be further adjusted after they checked on fit. Either way, I spoke with Larinda Plover at [facility] this morning, and someone is coming back to adjust. Foot plate needs to be trimmed. Shoe could also be stretched. I told pt to wait before she goes out and buys new shoes. 4/23- Per therapy, air splint does well and doesn't see reason need AFO?  4/24- AFO has velcro not working- needs a different piece- will have OT call and let [facility] know 2.  Antithrombotics: -DVT/anticoagulation:  Pharmaceutical:  Lovenox              -antiplatelet therapy: N/A 3. Chronic Pain/Pain Management: Followed by [facility] on [address]/Dr. Wynelle Heather [facility]) --Gabapentin  1200 mg TID. Tylenol  prn.Parafon  500 mg qid.              --oxycodone  prn. Oxycontin  10 mg bid.  4/17 pain  seems  to be under control  4/21--pain present, but she's working through it.   4/22- having more pain due to cold air this AM  4/23- More pain in LUE, but hasn't gotten meds yet this AM 4. Mood/Behavior/Sleep: LCSW to follow for evaluation and support.              -antipsychotic agents: N/A.              - Uses valium  for insomnia? Panic attacks? --Melatonin prn added-pt hasn't used yet -sleeping better/ See CPAP discussion below 5. Neuropsych/cognition: This patient is capable of making decisions on her own behalf. 6. Skin/Wound Care: vac now dc'ed.  - continue dry dressing (ABD pad) to wound for now. 4/22- incision looks OK right now- some drainage, localized erythema 4/23- needs PICC line dressing changed again since it's open- let nursing know needs to be changed! Added gerhardt's per nursing request.  7. Fluids/Electrolytes/Nutrition: Monitor I/O.               --Vitamin supplement and prostat to help promote wound healing.    -I personally reviewed the patient's labs today.   8. Enterobacter/MSSA Lumbar infection: Continue Cefepime  while hospitalized and transition to Cipro  500 mg bid/Cefadroxil  1 gram bid at discharge.             --benadryl  50 mg tid prn allergies-->has been using couple of times a dayu             --follow up with Dr. Zelda Hickman  4/17 WBC's up to 12k today.    -she is afebrile, wound looks great   -no other concerning signs or sx at present  4/19- will check Monday  4/22- WBC 9.6 down from 12k  4/24- WBC 10.0k 9. Recurrent Hypokalemia:  -4/17 today's level up stable at 3.8              --continue daily supplement    4/24- K+ 4.1 10.H/o IBS- C: Was not taking iron at home--will d/c.              --Black stools due to iron supplement.  -- Continue Amitiza  and order stool guaiacs.  -there is also likely a component of neurogenic bowel as patient was having incontinence PTA (2 years) 4/17 pt with another incontinent bowel movement yesterday.   -will start pm dulcolax suppository tonight  -continue amitiza  and am senna 4/18 LBM yesterday, Bms often documented as hard, add miralax  daily 4/21--LBM 4/20. She is refusing PM suppository.  She feels that continence is better, bowels more regular, will change supp to PRN 4/22- LBM 2 days ago- asked ofr daily miralax  this AM 4/23- LBM x6 yesterday- feeling much better 4/24 LBM 2 days ago when was cleaned out 11. HTN: Monitor BP TID --on Amlodipine , tenormin  and Chlorthalidone . --likely need potassium supplement due to chlorthalidone .      01/29/2024    6:22 AM 01/29/2024    4:04 AM 01/28/2024   10:45 PM  Vitals with BMI  Weight 209 lbs 14 oz    BMI 38.38    Systolic  138 149  Diastolic  80 73  Pulse  63 78  Intermittently elevated, continue to monitor trend for now  4/19- BP running elevated 150's to 170's today- on Atenolol  50 mg daily- if still elevated tomorrow, will increase Atenolol     4/22- BP running 140s-150s still- will increase Norvasc  to 10 mg since HR in low 70's  4/23- Norvasc  increase starting this AM  4/24- BP  looking 130s-140s- so doing better- con't regimen with increased norvasc  13. Neurogenic Bladder/ incontinence/frequency: Has been incontinent of bladder at nights and during the day.  -4/16 still no pvr's scanned despite mutliple requests! - I/O cath for vol >350cc -ongoing intermittent incontinence   -4/17 PVR not elevated, will try timed voids, consider anticholinergic  4/21- PVR's all low with one exception. Continue timed voids,   -will add low dose myrbetriq  and see how she does. 4/22- no significant improvement yet per pt 4/23- pt has chronic OAB 14. OSA: Uses CPAP at home-- pt using, she asked family  to bring nasal unit from home as she's struggling with nasal mask hospital provided.  -4/18-4/19 Discussed asking family again to try to bring home unit- pt says will try to get nephew to bring in.   4/21 pt has home unit/nasal prongs working much better!  4/23- slept 7 hours last night! 15. Allergies  4/23- asking for Benadryl  q6 hours scheduled- will try Claritin  daily first.  16. LLE edema  4/24- goes away with elevation- spent a long time with it hanging down yesterday- advised to elevate more and wear TEDs as she's been doing  I spent a total of 50  minutes on total care today- >50% coordination of care- due to  D/w pt at length, nursing as well as OT- also was in pt's room 30 minutes discuussing with pt about her pain; looking at AFO and addressing LLE edema   LOS: 10 days A FACE TO FACE EVALUATION WAS PERFORMED  Taydem Cavagnaro 01/29/2024, 8:34 AM

## 2024-01-29 NOTE — Evaluation (Signed)
 Recreational Therapy Assessment and Plan  Patient Details  Name: [name] MRN: [id number] Date of Birth: 08-25-1961 Today's Date: 01/29/2024  Rehab Potential:  Good ELOS:   d/c 4/28   Hospital Problem: Principal Problem:   Thoracic discitis Active Problems:   Thoracic myelopathy   Neurogenic bowel   Neurogenic bladder     Past Medical History:      Past Medical History:  Diagnosis Date   Anemia     Ankle syndesmosis disruption     Anxiety      takes Ativan  and Valium , after mother passed   Arthritis      bilateral knees s/p knee replacement bilaterally   Asthma     Bronchitis     Bruising      pt states unexplained d/t fibromyalgia   Chronic back pain      2012 tailbone surgery and 3 lower discs.    Closed fracture of distal end of right fibula and tibia     Depression      from Fibromyalgia diagnosis; not taking medicine. since 2001   Dizziness      rarely   Fibromyalgia      diagnosed 2001   GERD (gastroesophageal reflux disease)      Prilosec occasionally   Headache(784.0)      "sinus headaches"   History of hiatal hernia     Hypertension      since 2013   Hyperthyroidism      subclinical, no treatment; thyroid  nodules   IBS (irritable bowel syndrome)     Impaired memory      states from fibromyalgia   Insomnia      takes Ambien    Jones fracture      left foot fifth metatarsal   Multiple allergies      including latex, pet dander, shellfish, pet dander   Painful orthopaedic hardware right ankle 05/27/2016   Seasonal allergies     Shortness of breath      Occasional with exertion;    Sore gums      this is why pt is on Amoxil-only takes for dental work   Tachycardia     Thyroid  goiter     Varicose vein      protrudes above skin-per pt;vein popped and bruised;ultrasound done to make sure that there were no clots;noclots were found        Past Surgical History:       Past Surgical History:  Procedure Laterality Date   ANTERIOR CERVICAL  DECOMP/DISCECTOMY FUSION N/A 12/08/2020    Procedure: Cervical Four-Five Anterior cervical decompression/discectomy/fusion;  Surgeon: Audie Bleacher, MD;  Location: [facility] OR;  Service: Neurosurgery;  Laterality: N/A;  anterior   ANTERIOR LUMBAR FUSION   09/20/2011    Procedure: ANTERIOR LUMBAR FUSION 1 LEVEL;  Surgeon: Pasty Bongo;  Location: [facility];  Service: Neurosurgery;  Laterality: N/A;  Lumbar five-Sacral One Anterior Lumbar Interbody Fusion /Dr. Early to Approach    APPLICATION OF WOUND VAC N/A 05/14/2022    Procedure: Removal of wound vac with closure of wound;  Surgeon: Audie Bleacher, MD;  Location: [facility] OR;  Service: Neurosurgery;  Laterality: N/A;  Pt to be admitted on 05-12-2022   CERVICAL DISC SURGERY        WITH TITANIUM PLATE IN NECK---LEFT SIDE   CERVICAL SPINE SURGERY   03/24/2019   CESAREAN SECTION       EYE SURGERY Bilateral 2023    cataract   fibroidectomy  FRACTURE SURGERY   05/08/2010    Jones fracture left foot fifth metatarsal   HARDWARE REMOVAL Right 05/28/2016    Procedure: RIGHT ANKLE HARDWARE REMOVAL;  Surgeon: Elly Habermann, MD;  Location: [facility];  Service: Orthopedics;  Laterality: Right;   HARDWARE REMOVAL N/A 07/28/2020    Procedure: Removal of Lumbar Hardware;  Surgeon: Audie Bleacher, MD;  Location: [facility] OR;  Service: Neurosurgery;  Laterality: N/A;   HERNIA REPAIR        hiatal hernia   IR THORACIC DISC ASPIRATION W/IMG GUIDE   10/29/2023   IRRIGATION AND DEBRIDEMENT KNEE   04/09/2012    Procedure: IRRIGATION AND DEBRIDEMENT KNEE;  Surgeon: Forbes Ida., MD;  Location: [facility] OR;  Service: Orthopedics;  Laterality: Right;   JOINT REPLACEMENT   01/27/2012    left total knee and Right total knee   KNEE SURGERY   2005    Left knee arthroscopy   LAMINECTOMY WITH POSTERIOR LATERAL ARTHRODESIS LEVEL 4 Bilateral 03/22/2022    Procedure: Thoracic ten to Lumbar three Posterior lateral arthrodesis with screws;  Surgeon: Audie Bleacher, MD;   Location: [facility] OR;  Service: Neurosurgery;  Laterality: Bilateral;   LUMBAR WOUND DEBRIDEMENT N/A 04/08/2022    Procedure: LUMBAR WOUND DEBRIDEMENT;  Surgeon: Audie Bleacher, MD;  Location: [facility] OR;  Service: Neurosurgery;  Laterality: N/A;   NASAL SEPTOPLASTY W/ TURBINOPLASTY   2007    due to recurrent sinusitis   ORIF ANKLE FRACTURE Right 06/21/2015    Procedure: OPEN REDUCTION INTERNAL FIXATION RIGHT DISTAL FIBULA  FRACTURE AND OPEN REDUCTION INTERNAL FIXATION SYNDESMOSIS ;  Surgeon: Elly Habermann, MD;  Location:  SURGERY CENTER;  Service: Orthopedics;  Laterality: Right;   PARTIAL HYSTERECTOMY       SHOULDER ARTHROSCOPY W/ ROTATOR CUFF REPAIR Right     SHOULDER SURGERY Left     SPINE SURGERY   2004    Cervical plate, ACDF   STERIOD INJECTION   01/27/2012    Procedure: STEROID INJECTION;  Surgeon: Genevie Kerns, MD;  Location: [facility] OR;  Service: Orthopedics;  Laterality: Right;   TEE WITHOUT CARDIOVERSION N/A 03/01/2020    Procedure: TRANSESOPHAGEAL ECHOCARDIOGRAM (TEE);  Surgeon: Hugh Madura, MD;  Location: [facility] ENDOSCOPY;  Service: Cardiovascular;  Laterality: N/A;   THORACIC DISCECTOMY   02/16/2020    Procedure: Thoracic ten-eleven Discectomy;  Surgeon: Audie Bleacher, MD;  Location: [facility] OR;  Service: Neurosurgery;;   THORACIC DISCECTOMY N/A 01/08/2024    Procedure: THORACIC NINE-TEN LAMINECTOMY AND DECOMPRESSION;  Surgeon: Audie Bleacher, MD;  Location: [facility] OR;  Service: Neurosurgery;  Laterality: N/A;   TONSILLECTOMY   2007   TOTAL KNEE ARTHROPLASTY   01/27/2012    Procedure: TOTAL KNEE ARTHROPLASTY;  Surgeon: Genevie Kerns, MD;  Bilateral   TOTAL KNEE ARTHROPLASTY   04/06/2012    Procedure: TOTAL KNEE ARTHROPLASTY;  Surgeon: Genevie Kerns, MD;  Location: [facility] OR;  Service: Orthopedics;  Laterality: Right;   TUBAL LIGATION       UTERINE FIBROID SURGERY        mid 200s   WOUND EXPLORATION N/A 02/27/2020    Procedure: WOUND EXPLORATION;  Surgeon: Audie Bleacher, MD;  Location: [facility]  OR;  Service: Neurosurgery;  Laterality: N/A;,  (wound vac upper back)   WOUND EXPLORATION N/A 03/30/2022    Procedure: THORACIC WOUND EXPLORATION WITH REPAIR OF CSF LEAK;  Surgeon: Audie Bleacher, MD;  Location: [facility] OR;  Service: Neurosurgery;  Laterality: N/A;  Assessment & Plan Clinical Impression: Patient is a 63 year old female with history of asthma, Anxiety d/o, chronic LBP, depression, Fibromyalgia, HTN,  obesity, MSSA hardware associated lumbar wound infection treated with IV antibiotics and antibiotic suppression 2021, thoracic wound drainage due to enterobacter and underwent lumbar wound debridement and repair of CSF leak 03/2022 delayed wound healing and prolonged course of antibiotics. She was found to have T9 discitis/osteomyelitis with epidural abscess/phlegmon and BLE weakness/numbness on Jan 2025 admission. She was discharged to home on IV cefepime  but had issues with occluded PICC requiring readmission 02/19-02/27 and with plans for extended antibiotics thru 01/05/24 followed by oral suppression. She was admitted antibiotics thru 12/30/23 with dislodged PICC, found to have severe hypokalemia K-2.7 which was supplemented and she was d/c to home with antibiotics extended to 01/19/24. She was admitted on 01/08/24 for laminectomy with decompression of spinal stenosis T9-T10. Patient transferred to CIR on 01/19/2024.     Pt presents with decreased activity tolerance, decreased functional mobility, decreased balance Limiting pt's independence with leisure/community pursuits.  Met with pt today to discuss TR services including leisure education, activity analysis/modifications and stress management.  Also discussed the importance of social, emotional, spiritual health in addition to physical health and their effects on overall health and wellness.  Pt stated understanding and is excited about upcoming d/c.   Plan  No further TR as pt is expected to d/c 4/29  Recommendations for other  services: None   Discharge Criteria: Patient will be discharged from TR if patient refuses treatment 3 consecutive times without medical reason.  If treatment goals not met, if there is a change in medical status, if patient makes no progress towards goals or if patient is discharged from hospital.  The above assessment, treatment plan, treatment alternatives and goals were discussed and mutually agreed upon: by patient  [name] 01/29/2024, 8:25 AM

## 2024-01-29 NOTE — Progress Notes (Signed)
 Occupational Therapy Session Note  Patient Details  Name: [name] MRN: [id number] Date of Birth: [birth year]  Today's Date: 01/29/2024 OT Individual Time: 0800-0850 OT Individual Time Calculation (min): 50 min    Short Term Goals: Week 2:  OT Short Term Goal 1 (Week 2): Pt will complete LB bating with mod A using AE PRN OT Short Term Goal 2 (Week 2): Pt will complete LB dressing with mod A using AE PRN OT Short Term Goal 3 (Week 2): Pt will perfrom toilet transfer with supervision  Skilled Therapeutic Interventions/Progress Updates:      Therapy Documentation Precautions:  Precautions Precautions: Fall, Back Precaution Booklet Issued: Yes (comment) Recall of Precautions/Restrictions: Impaired Precaution/Restrictions Comments: Spinal precautions-No back brace required. Required Braces or Orthoses: Other Brace Other Brace: Pt with personal air cast on R ankle. Pt reports ankle instability for ~10 years from previous ankle dislocation. Restrictions Weight Bearing Restrictions Per Provider Order: No General: "Nice seeing you again!" Pt supine in bed upon OT arrival, agreeable to OT session.  Pain: no pain reported, swelling reported in BLE, MD aware d/t visit during session  ADL: OT providing skilled intervention on ADL retraining in order to increase independence with tasks and increase activity tolerance. Pt completed the following tasks at the current level of assist: Bed mobility: SBA with HOB raised from supine>EOB, Min A to manage LE EOB>supine UB dressing: set-up for donning/doffing overhead shirt LB dressing: Mod A d/t time constraints for managing over buttocks  Transfers: SBA sit to stand from elevated bed and increased time, no VC required for safety  Other Treatments: OT adjusted custom bracing for RLE for increased fit and adjusting strapping. OT educating increased elevation for BLE while in bed for decreased swelling   Pt supine in bed with bed alarm  activated, 2 bed rails up, call light within reach and 4Ps assessed.   Therapy/Group: Individual Therapy  Nila Barth, OTD, OTR/L 01/29/2024, 11:33 AM

## 2024-01-29 NOTE — Plan of Care (Signed)
  Problem: RH SKIN INTEGRITY Goal: RH STG SKIN FREE OF INFECTION/BREAKDOWN Outcome: Progressing   Problem: RH SAFETY Goal: RH STG ADHERE TO SAFETY PRECAUTIONS W/ASSISTANCE/DEVICE Description: STG Adhere to Safety Precautions With supervision/ mod I  Assistance/Device. Outcome: Progressing   Problem: RH PAIN MANAGEMENT Goal: RH STG PAIN MANAGED AT OR BELOW PT'S PAIN GOAL Description: <4 w/ prns Outcome: Progressing   Problem: RH KNOWLEDGE DEFICIT SCI Goal: RH STG INCREASE KNOWLEDGE OF SELF CARE AFTER SCI Description: Manage increase knowledge deficit of self care after SCI with supervision/mod I assistance from caregivers using educational materials provided Outcome: Progressing

## 2024-01-29 NOTE — Progress Notes (Signed)
 Physical Therapy Session Note  Patient Details  Name: [name] MRN: [id number] Date of Birth: Oct 02, 1961  Today's Date: 01/29/2024 PT Individual Time: 0945-1100, [id number] PT Individual Time Calculation (min): 75 min, 70 min   Short Term Goals: Week 2:  PT Short Term Goal 1 (Week 2): =LTGs d/t ELOS  Skilled Therapeutic Interventions/Progress Updates:    Session 1: pt received in bed and agreeable to therapy. Pt reports pain controlled with medication at this time. Assisted pt with donning ted hose, brace, and shoes.   Bed mobility with CGA and VC to maintain back precautions. Sit to stand from elevated bed with CGA and RW and min a from w/c. CGA Stand pivot transfer with RW. Pt propelled w/c ~ 150 ft for endurance and functional mobility. Transported remaining distance for energy  conservation.   Pt then participated in 3 bouts of 2-3 reps BIL step ups on 6" step using BIL handrails to build strength and endurance for safe stair navigation. Pt required extended rest breaks and demoed heavy UE reliance. Min a overall with knee block for safety. Discussed safe options for stair navigation, such as alternating step to pattern to conserve energy.   Pt returned to room and ambulatory transfer to bathroom. Continent bladder void documented in flow sheet.  Min a for hygiene and Acupuncturist. Pt remained in recliner at end of session with needs in reach.   Session 2: Pt received in recliner and agreeable to therapy, reporting urgent need to use bathroom. Also reporting 8/10 pain, nsg provided tylenol  during session. ambulatory transfer to bathroom with CGA overall, min a for clothing management, tot a for posterior hygiene. Continent BM, documented in flow sheet. Hand hygiene in standing with supervision.   Pt transported to therapy gym for time management and energy conservation. CGA Stand pivot transfer to nustep. Used rolling hills program 5-10 load interval x 15 min for global  strength and endurance and release of endorphins in response to exercise for holistic pain management. Cued to maintain > 40 spm. 594 total step, avg 38 spm.  Pt then ambulated 2 x ~40 ft with RW and CGA for endurance and functional mobility. Note decreasing UE reliance vs previous sessions. Pt then returned to room and was assisted to bathroom. Care handed off to NT with pt seated in bathroom to complete toileting.      Therapy Documentation Precautions:  Precautions Precautions: Fall, Back Precaution Booklet Issued: Yes (comment) Recall of Precautions/Restrictions: Impaired Precaution/Restrictions Comments: Spinal precautions-No back brace required. Required Braces or Orthoses: Other Brace Other Brace: Pt with personal air cast on R ankle. Pt reports ankle instability for ~10 years from previous ankle dislocation. Restrictions Weight Bearing Restrictions Per Provider Order: No General:      Therapy/Group: Individual Therapy  Tex Filbert 01/29/2024, 1:13 PM

## 2024-01-30 ENCOUNTER — Other Ambulatory Visit (HOSPITAL_COMMUNITY): Payer: Self-pay

## 2024-01-30 MED ORDER — SENNOSIDES-DOCUSATE SODIUM 8.6-50 MG PO TABS
2.0000 | ORAL_TABLET | Freq: Every day | ORAL | 0 refills | Status: DC
  Filled 2024-01-30: qty 60, 30d supply, fill #0

## 2024-01-30 MED ORDER — POLYETHYLENE GLYCOL 3350 17 GM/SCOOP PO POWD
17.0000 g | Freq: Every day | ORAL | 0 refills | Status: AC
  Filled 2024-01-30: qty 476, 28d supply, fill #0

## 2024-01-30 MED ORDER — MELATONIN 5 MG PO TABS
5.0000 mg | ORAL_TABLET | Freq: Every evening | ORAL | 0 refills | Status: DC | PRN
  Filled 2024-01-30: qty 30, 30d supply, fill #0

## 2024-01-30 MED ORDER — LORATADINE 10 MG PO TABS
10.0000 mg | ORAL_TABLET | Freq: Every day | ORAL | 0 refills | Status: DC
  Filled 2024-01-30: qty 30, 30d supply, fill #0

## 2024-01-30 MED ORDER — OXYCODONE HCL ER 10 MG PO T12A
10.0000 mg | EXTENDED_RELEASE_TABLET | Freq: Two times a day (BID) | ORAL | 0 refills | Status: DC
Start: 2024-01-30 — End: 2024-01-31
  Filled 2024-01-30: qty 14, 7d supply, fill #0

## 2024-01-30 MED ORDER — POTASSIUM CHLORIDE CRYS ER 10 MEQ PO TBCR
10.0000 meq | EXTENDED_RELEASE_TABLET | Freq: Every day | ORAL | 0 refills | Status: DC
  Filled 2024-01-30: qty 30, 30d supply, fill #0

## 2024-01-30 MED ORDER — MIRABEGRON ER 25 MG PO TB24
25.0000 mg | ORAL_TABLET | Freq: Every day | ORAL | 0 refills | Status: DC
  Filled 2024-01-30: qty 15, 15d supply, fill #0

## 2024-01-30 MED ORDER — ACETAMINOPHEN 325 MG PO TABS
325.0000 mg | ORAL_TABLET | ORAL | Status: DC | PRN
Start: 2024-01-30 — End: 2024-06-22

## 2024-01-30 MED ORDER — CIPROFLOXACIN HCL 500 MG PO TABS
500.0000 mg | ORAL_TABLET | Freq: Two times a day (BID) | ORAL | 0 refills | Status: DC
  Filled 2024-01-30 – 2024-02-02 (×2): qty 60, 30d supply, fill #0

## 2024-01-30 MED ORDER — CEFADROXIL 500 MG PO CAPS
1000.0000 mg | ORAL_CAPSULE | Freq: Two times a day (BID) | ORAL | 0 refills | Status: DC
  Filled 2024-01-30: qty 120, 30d supply, fill #0

## 2024-01-30 MED ORDER — HYDROCERIN EX CREA: 1.0000 | TOPICAL_CREAM | Freq: Two times a day (BID) | CUTANEOUS | Status: AC

## 2024-01-30 MED ORDER — CHLORTHALIDONE 25 MG PO TABS: 25.0000 mg | ORAL_TABLET | Freq: Every day | ORAL | Status: DC

## 2024-01-30 MED ORDER — POTASSIUM CHLORIDE CRYS ER 10 MEQ PO TBCR
10.0000 meq | EXTENDED_RELEASE_TABLET | Freq: Every day | ORAL | Status: DC
  Administered 2024-01-31 – 2024-02-01 (×2): 10 meq via ORAL
  Filled 2024-01-30 (×2): qty 1

## 2024-01-30 MED ORDER — OXYCODONE HCL 10 MG PO TABS
10.0000 mg | ORAL_TABLET | Freq: Four times a day (QID) | ORAL | 0 refills | Status: DC | PRN
  Filled 2024-01-30: qty 21, 6d supply, fill #0

## 2024-01-30 MED ORDER — AMLODIPINE BESYLATE 10 MG PO TABS
10.0000 mg | ORAL_TABLET | Freq: Every morning | ORAL | 0 refills | Status: DC
  Filled 2024-01-30: qty 30, 30d supply, fill #0

## 2024-01-30 NOTE — Progress Notes (Signed)
 Physical Therapy Session Note  Patient Details  Name: [name] MRN: [id number] Date of Birth: 05-Feb-1961  Today's Date: 01/30/2024 PT Individual Time: 0915-1030, [id number]    PT Individual Time Calculation (min): 75 min, 76 min   Short Term Goals: Week 2:  PT Short Term Goal 1 (Week 2): =LTGs d/t ELOS  Skilled Therapeutic Interventions/Progress Updates:    Session 1; pt received in care of NT after completing toileting. Assisted pt with donning ted hose, brace, and shoes in sitting. Educated on donning ted hose supine if possible to maximize benefit for edema.   Gait x ~80 ft with RW, cues for increased step length. Switched to pt's personal rollator (adjusted during session for more upright posture and better gait mechanics) and ambulated 130 ft and x 170 ft. Cues and visual demo for increased knee flexion to compensate for ankle fixed in brace, reducing external rotation moment and circumduction "toe off", also discussed knee hyperextension thrust that worsens with fatigue. Discussed using gait deviations as a  signal to stop and rest for energy conservation.  Performed 2 x 10 Sit to stand from mat table at w/c height, with cues for anterior weight shift and safety with rollator, including pushing from seat whenever able.   Pt then returned to room and to recliner, was left with needs in reach.  Session 2: Pt received in recliner and agreeable to therapy.  Reports 8/10 back pain and 10/10 head and shoulder pain. Nsg provided pain medication and pt did not require further intervention to participate.   Pt mentions LE edema from sitting up, provided education and x 2 bouts practice LE exercise for edema management: ankle pumps, small range heel slides, glute/quad set, and internal external rotation. Activities adapted for pt's strength level and ability in chair, discussed how to perform in bed with greater ROM.   Pt transported to therapy gym for time management and energy  conservation. Pt participated in step ups on 6" step to build strength in BLE to improve functional ability to navigate stairs. Performed 3 x 3 BIL with BHR, CGA to occasional min a.   Pt performed the following exercises to promote LE strength and endurance with 2.5 lb ankle weights. Alternated following exercises x 4 rounds:  -seated marches  -seated LAQ Both performed to fatigue to build LE endurance.   Returned to recliner with ambulatory transfer with CGA and cues for Sit to stand technique. Pt remained with needs in reach.   Therapy Documentation Precautions:  Precautions Precautions: Fall, Back Precaution Booklet Issued: Yes (comment) Recall of Precautions/Restrictions: Impaired Precaution/Restrictions Comments: Spinal precautions-No back brace required. Required Braces or Orthoses: Other Brace Other Brace: Pt with personal air cast on R ankle. Pt reports ankle instability for ~10 years from previous ankle dislocation. Restrictions Weight Bearing Restrictions Per Provider Order: No General:      Therapy/Group: Individual Therapy  Tex Filbert 01/30/2024, 1:31 PM

## 2024-01-30 NOTE — Progress Notes (Signed)
 Occupational Therapy Session Note  Patient Details  Name: [name] MRN: [id number] Date of Birth: October 03, 1961  Today's Date: 01/30/2024 OT Individual Time: 1305-1400 OT Individual Time Calculation (min): 55 min    Short Term Goals: Week 1:  OT Short Term Goal 1 (Week 1): Pt will complete LB bathing and dressing with Max A and use of AE. OT Short Term Goal 1 - Progress (Week 1): Met OT Short Term Goal 2 (Week 1): Pt will completed toileting with Max A Week 2:  OT Short Term Goal 1 (Week 2): Pt will complete LB bating with mod A using AE PRN OT Short Term Goal 2 (Week 2): Pt will complete LB dressing with mod A using AE PRN OT Short Term Goal 3 (Week 2): Pt will perfrom toilet transfer with supervision  Skilled Therapeutic Interventions/Progress Updates:    1:1 Pt received in the w/c. Came back to see pt in pm to be able to shower when she wasn't receiving IV antibiotics (was scheduled to see this am). Pt ambulated with her upright rollator to the bathroom with supervision including the sit to stand. PT requires extra time turning with feet positioning and manuevering the upright walker. Picc line and incision covered. PT able to bathe 10/10 parts with long handle sponge sitting on the BSC. Pt transferred out of shower with supervision with RW . Pt able to don shirt I. Pt uses reacher without needing cues to thread LB clothing. Continued to problem solve who will cover her incision and PICC line. Sit to stands to pull pants with supervision. Anticipate pt will need A to don shoes with AFO at d/c however pt reports she has an idea of how to don them with her leg up on her bed with pillows to be able to don.   PT ambulated from sink to recliner with upright RW with supervision at slow speed. Pt left sitting up in prep for next session.   Therapy Documentation Precautions:  Precautions Precautions: Fall, Back Precaution Booklet Issued: Yes (comment) Recall of Precautions/Restrictions:  Impaired Precaution/Restrictions Comments: Spinal precautions-No back brace required. Required Braces or Orthoses: Other Brace Other Brace: Pt with personal air cast on R ankle. Pt reports ankle instability for ~10 years from previous ankle dislocation. Restrictions Weight Bearing Restrictions Per Provider Order: No  Pain: Pain Assessment Pain Scale: 0-10 Pain Score: 10-Worst pain ever Pain Location: Back Pain Intervention(s): Medication (See eMAR)   Therapy/Group: Individual Therapy  [name] [facility] 01/30/2024, 1:38 PM

## 2024-01-30 NOTE — Progress Notes (Signed)
 PROGRESS NOTE   Subjective/Complaints:  Pt reports feeling good this AM.  LBM yesterday "finally".   Chocolate ensure or hot chocolate cooled down makes her have BM [place]raw[place] and Vanilla upset her stomach but don't help her go.  Ate 75% tray.    ROS:    Pt denies SOB, abd pain, CP, N/V/C/D, and vision changes    Objective:   No results found. Recent Labs    01/29/24 0518  WBC 10.0  HGB 11.2*  HCT 37.0  PLT 282   Recent Labs    01/29/24 0518  NA 139  K 4.1  CL 102  CO2 27  GLUCOSE 104*  BUN 17  CREATININE 0.62  CALCIUM  9.2    Intake/Output Summary (Last 24 hours) at 01/30/2024 0856 Last data filed at 01/29/2024 1619 Gross per 24 hour  Intake 240 ml  Output --  Net 240 ml        Physical Exam: Vital Signs Blood pressure (!) 152/77, pulse 72, temperature 98.3 F (36.8 C), resp. rate 17, height 5\' 2"  (1.575 m), weight 95.2 kg, last menstrual period 11/20/2012, SpO2 99%.       General: awake, alert, appropriate, sitting up in bed; NAD HENT: conjugate gaze; oropharynx moist CV: regular rate and rhythm; no JVD Pulmonary: CTA B/L; no W/R/R- good air movement GI: soft, NT, ND, (+)BS Psychiatric: appropriate Neurological: Ox3  Extremities: trace to 1+ LLE to distal calf- no change this AM  Ext: no clubbing, cyanosis, or edema Psych: pleasant and cooperative  Skin: Back incision CDI with puckering around incision- has divot in tissue from prior back surgeries, but looks OK- Neuro:  Alert and oriented x 3. Normal insight and awareness. Intact Memory. Normal language and speech. Cranial nerve exam unremarkable. MMT: RUE 4+ to 5/5 and LUE 4-/5 to 4+/5 prox to distal, both remain limited by shoulder pain/DJD. RLE 2/5 prox to 2/5 ADF/PF limited by right ankle. LLE 3-/5 HF, KE and 3+ADF/PF. Decreased LT below the umbilicus but senses general touch and pain.. DTR's 1+ Musculoskeletal: DJD/limited ROM both  shoulders L>>R. Right ankle with limited ADF/PF--30-50 degrees perhaps, chronic changes noted.    LB/midback still tender.   AFO at bedside--PL-type, foot plate appears very large.  Assessment/Plan: 1. Functional deficits which require 3+ hours per day of interdisciplinary therapy in a comprehensive inpatient rehab setting. Physiatrist is providing close team supervision and 24 hour management of active medical problems listed below. Physiatrist and rehab team continue to assess barriers to discharge/monitor patient progress toward functional and medical goals  Care Tool:  Bathing        Body parts bathed by helper: Right arm, Left arm, Chest, Abdomen, Front perineal area, Buttocks, Right upper leg, Left upper leg, Face Body parts n/a: Right lower leg, Left lower leg   Bathing assist Assist Level: Minimal Assistance - Patient > 75%     Upper Body Dressing/Undressing Upper body dressing   What is the patient wearing?: Pull over shirt    Upper body assist Assist Level: Supervision/Verbal cueing    Lower Body Dressing/Undressing Lower body dressing      What is the patient wearing?: Pants, Incontinence brief  Lower body assist Assist for lower body dressing: Minimal Assistance - Patient > 75%     Toileting Toileting    Toileting assist Assist for toileting: Contact Guard/Touching assist     Transfers Chair/bed transfer  Transfers assist     Chair/bed transfer assist level: Minimal Assistance - Patient > 75%     Locomotion Ambulation   Ambulation assist      Assist level: Minimal Assistance - Patient > 75% Assistive device: Walker-rolling Max distance: 20 ft   Walk 10 feet activity   Assist     Assist level: Minimal Assistance - Patient > 75% Assistive device: Walker-rolling   Walk 50 feet activity   Assist Walk 50 feet with 2 turns activity did not occur: Safety/medical concerns         Walk 150 feet activity   Assist Walk 150 feet  activity did not occur: Safety/medical concerns         Walk 10 feet on uneven surface  activity   Assist Walk 10 feet on uneven surfaces activity did not occur: Safety/medical concerns         Wheelchair     Assist Is the patient using a wheelchair?: Yes Type of Wheelchair: Manual    Wheelchair assist level: Dependent - Patient 0%      Wheelchair 50 feet with 2 turns activity    Assist        Assist Level: Dependent - Patient 0%   Wheelchair 150 feet activity     Assist      Assist Level: Dependent - Patient 0%   Blood pressure (!) 152/77, pulse 72, temperature 98.3 F (36.8 C), resp. rate 17, height 5\' 2"  (1.575 m), weight 95.2 kg, last menstrual period 11/20/2012, SpO2 99%.  Medical Problem List and Plan: 1. Functional deficits secondary to T9/10 myelopathy d/t severe spinal stenosis  and hx of T9 discitis/osteomyelitis             -patient may  shower--pat wound dry after shower.              -ELOS/Goals: 14-21 days, goals are mod I to supervision/min assist with PT and OT  D/c 4/28 Con't CIR PT and OT  -has an upright rolator- that has elbow rests and brakes up high -4/21-AFO delivered, doesn't fit shoe after I was told Thursday it would be further adjusted after they checked on fit. Either way, I spoke with Larinda Plover at [facility] this morning, and someone is coming back to adjust. Foot plate needs to be trimmed. Shoe could also be stretched. I told pt to wait before she goes out and buys new shoes. Con't CIR PT and OT 2.  Antithrombotics: -DVT/anticoagulation:  Pharmaceutical: Lovenox              -antiplatelet therapy: N/A 3. Chronic Pain/Pain Management: Followed by [facility] on [address]/Dr. Wynelle Heather [facility]) --Gabapentin  1200 mg TID. Tylenol  prn.Parafon  500 mg qid.              --oxycodone  prn. Oxycontin  10 mg bid.  4/17 pain  seems to be under control  4/21--pain present, but she's working through it.   4/22- having more pain  due to cold air this AM  4/23- More pain in LUE, but hasn't gotten meds yet this AM 4. Mood/Behavior/Sleep: LCSW to follow for evaluation and support.              -antipsychotic agents: N/A.              -  Uses valium  for insomnia? Panic attacks? --Melatonin prn added-pt hasn't used yet -sleeping better/ See CPAP discussion below 5. Neuropsych/cognition: This patient is capable of making decisions on her own behalf. 6. Skin/Wound Care: vac now dc'ed.  - continue dry dressing (ABD pad) to wound for now. 4/22- incision looks OK right now- some drainage, localized erythema 4/23- needs PICC line dressing changed again since it's open- let nursing know needs to be changed! Added gerhardt's per nursing request.  7. Fluids/Electrolytes/Nutrition: Monitor I/O.               --Vitamin supplement and prostat to help promote wound healing.    -I personally reviewed the patient's labs today.   8. Enterobacter/MSSA Lumbar infection: Continue Cefepime  while hospitalized and transition to Cipro  500 mg bid/Cefadroxil  1 gram bid at discharge.             --benadryl  50 mg tid prn allergies-->has been using couple of times a dayu             --follow up with Dr. Zelda Hickman  4/17 WBC's up to 12k today.    -she is afebrile, wound looks great   -no other concerning signs or sx at present  4/19- will check Monday  4/22- WBC 9.6 down from 12k  4/24- WBC 10.0k 9. Recurrent Hypokalemia:  -4/17 today's level up stable at 3.8              --continue daily supplement    4/24- K+ 4.1 10.H/o IBS- C: Was not taking iron at home--will d/c.             --Black stools due to iron supplement.  -- Continue Amitiza  and order stool guaiacs.  -there is also likely a component of neurogenic bowel as patient was having incontinence PTA (2 years) 4/17 pt with another incontinent bowel movement yesterday.   -will start pm dulcolax suppository tonight  -continue amitiza  and am senna 4/18 LBM yesterday, Bms often documented as  hard, add miralax  daily 4/21--LBM 4/20. She is refusing PM suppository.  She feels that continence is better, bowels more regular, will change supp to PRN 4/22- LBM 2 days ago- asked ofr daily miralax  this AM 4/23- LBM x6 yesterday- feeling much better 4/24 LBM 2 days ago when was cleaned out 4/25- LBM yesterday 11. HTN: Monitor BP TID --on Amlodipine , tenormin  and Chlorthalidone . --likely need potassium supplement due to chlorthalidone .      01/30/2024    6:25 AM 01/29/2024    8:01 PM 01/29/2024    5:13 PM  Vitals with BMI  Systolic 152 138 409  Diastolic 77 72 75  Pulse 72 83 68  Intermittently elevated, continue to monitor trend for now  4/19- BP running elevated 150's to 170's today- on Atenolol  50 mg daily- if still elevated tomorrow, will increase Atenolol     4/22- BP running 140s-150s still- will increase Norvasc  to 10 mg since HR in low 70's  4/23- Norvasc  increase starting this AM  4/24- BP looking 130s-140s- so doing better- con't regimen with increased norvasc   4/25- BP overall controlled, but slightly elevated in AM- con't regimen and monitoring 13. Neurogenic Bladder/ incontinence/frequency: Has been incontinent of bladder at nights and during the day.  -4/16 still no pvr's scanned despite mutliple requests! - I/O cath for vol >350cc -ongoing intermittent incontinence   -4/17 PVR not elevated, will try timed voids, consider anticholinergic  4/21- PVR's all low with one exception. Continue timed voids,   -will add low  dose myrbetriq  and see how she does. 4/22- no significant improvement yet per pt 4/23- pt has chronic OAB 14. OSA: Uses CPAP at home-- pt using, she asked family to bring nasal unit from home as she's struggling with nasal mask hospital provided.  -4/18-4/19 Discussed asking family again to try to bring home unit- pt says will try to get nephew to bring in.   4/21 pt has home unit/nasal prongs working much better!  4/23- slept 7 hours last night! 15.  Allergies  4/23- asking for Benadryl  q6 hours scheduled- will try Claritin  daily first.  16. LLE edema  4/24- goes away with elevation- spent a long time with it hanging down yesterday- advised to elevate more and wear TEDs as she's been doing    LOS: 11 days A FACE TO FACE EVALUATION WAS PERFORMED  Syed Zukas 01/30/2024, 8:56 AM

## 2024-01-30 NOTE — Discharge Instructions (Addendum)
 Inpatient Rehab Discharge Instructions  RYA BISAILLON Discharge date and time:  02/02/24  Activities/Precautions/ Functional Status: Activity: no lifting, driving, or strenuous exercise till cleared by MD Diet: regular diet Wound Care: keep wound clean and dry. Contact Dr. Michale Age if you develop any problems with your incision/wound--redness, swelling, increase in pain, drainage or if you develop fever or chills.    Functional status:  ___ No restrictions     ___ Walk up steps independently ___ 24/7 supervision/assistance   ___ Walk up steps with assistance _X__ Intermittent supervision/assistance  ___ Bathe/dress independently ___ Walk with walker     _X__ Bathe/dress with assistance ___ Walk Independently    ___ Shower independently _X__ Walk with supervision     ___ Shower with assistance _X__ No alcohol      ___ Return to work/school ________   Special Instructions: Appointment with Dr. Carmina Chris set for May 10 th at 3:30 pm. You need to keep this appointment as they will refill your antibiotics. Use benadryl  or allergra twice a day with MS contin  to prevent itching. Can also use Sarna lotion.  3.  Percocet 10 mg # 40 last filled on 12/25/23) 4.  Recommend supervision for a few day when up and walking at home.   My questions have been answered and I understand these instructions. I will adhere to these goals and the provided educational materials after my discharge from the hospital.  Patient/Caregiver Signature _______________________________ Date __________  Clinician Signature _______________________________________ Date __________  Please bring this form and your medication list with you to all your follow-up doctor's appointments.

## 2024-01-30 NOTE — Progress Notes (Signed)
 Patient ID: [name], female   DOB: May 28, 1961, 63 y.o.   MRN: [id number]\  D/c date is confirmed for monday. Pt is working on which nephew can pick her up on Monday.   SW sent new order and d/c date to [facility].   Norval Been, MSW, LCSW Office: [phone] Cell: [phone] Fax: [phone]

## 2024-01-31 ENCOUNTER — Other Ambulatory Visit (HOSPITAL_COMMUNITY): Payer: Self-pay

## 2024-01-31 MED ORDER — MORPHINE SULFATE ER 15 MG PO TBCR
15.0000 mg | EXTENDED_RELEASE_TABLET | Freq: Two times a day (BID) | ORAL | Status: DC
  Administered 2024-01-31 – 2024-02-02 (×4): 15 mg via ORAL
  Filled 2024-01-31 (×4): qty 1

## 2024-01-31 MED ORDER — MORPHINE SULFATE ER 15 MG PO TBCR
15.0000 mg | EXTENDED_RELEASE_TABLET | Freq: Two times a day (BID) | ORAL | 0 refills | Status: DC
  Filled 2024-01-31: qty 14, 7d supply, fill #0

## 2024-01-31 NOTE — Progress Notes (Addendum)
 OxyContin  not covered by patients insurance. Will change to MS contin  and monitor for effectiveness.

## 2024-01-31 NOTE — Plan of Care (Signed)
  Problem: Consults Goal: RH SPINAL CORD INJURY PATIENT EDUCATION Description:  See Patient Education module for education specifics.  Outcome: Progressing   Problem: SCI BOWEL ELIMINATION Goal: RH STG MANAGE BOWEL WITH ASSISTANCE Description: STG Manage Bowel with supervision/mod I Assistance. Outcome: Progressing Goal: RH STG SCI MANAGE BOWEL WITH MEDICATION WITH ASSISTANCE Description: STG SCI Manage bowel with medication with supervision/mod I assistance. Outcome: Progressing   Problem: SCI BLADDER ELIMINATION Goal: RH STG MANAGE BLADDER WITH ASSISTANCE Description: STG Manage Bladder With supervision/mod I Assistance Outcome: Progressing   Problem: RH SKIN INTEGRITY Goal: RH STG SKIN FREE OF INFECTION/BREAKDOWN Outcome: Progressing   Problem: RH SAFETY Goal: RH STG ADHERE TO SAFETY PRECAUTIONS W/ASSISTANCE/DEVICE Description: STG Adhere to Safety Precautions With supervision/ mod I  Assistance/Device. Outcome: Progressing   Problem: RH PAIN MANAGEMENT Goal: RH STG PAIN MANAGED AT OR BELOW PT'S PAIN GOAL Description: <4 w/ prns Outcome: Progressing   Problem: RH KNOWLEDGE DEFICIT SCI Goal: RH STG INCREASE KNOWLEDGE OF SELF CARE AFTER SCI Description: Manage increase knowledge deficit of self care after SCI with supervision/mod I assistance from caregivers using educational materials provided Outcome: Progressing

## 2024-01-31 NOTE — Progress Notes (Signed)
 Physical Therapy Discharge Summary  Patient Details  Name: COLLEENA BARLAS MRN: [id number] Date of Birth: Aug 12, 1961  Date of Discharge from PT service:{Time; dates multiple:304500300}  {CHL IP REHAB PT TIME CALCULATION:304800500}   Patient has met 9 of 9 long term goals due to improved activity tolerance, improved balance, increased strength, decreased pain, and improved coordination.  Patient to discharge at an ambulatory level Modified Independent.   Patient's care partner {care partner:3041650} to provide the necessary {assistance:3041652} assistance at discharge.  Reasons goals not met: ***  Recommendation:  Patient will benefit from ongoing skilled PT services in home health setting to continue to advance safe functional mobility, address ongoing impairments in ***, and minimize fall risk.  Equipment: {equipment:3041657}  Reasons for discharge: {Reason for discharge:3049018}  Patient/family agrees with progress made and goals achieved: {Pt/Family agree with progress/goals:3049020}  PT Discharge Precautions/Restrictions   Vital Signs Therapy Vitals Temp: 98.3 F (36.8 C) Pulse Rate: 70 Resp: 16 BP: (!) 150/70 Patient Position (if appropriate): Sitting Oxygen Therapy SpO2: 98 % O2 Device: Room Air Pain Pain Assessment Pain Scale: 0-10 Pain Score: 10-Worst pain ever Pain Location: Back Pain Intervention(s): Medication (See eMAR) Pain Interference   Vision/Perception     Cognition   Sensation   Motor     Mobility   Locomotion     Trunk/Postural Assessment     Balance   Extremity Assessment            Rosita DeChalus 01/31/2024, 4:52 PM

## 2024-01-31 NOTE — Progress Notes (Signed)
 Physical Therapy Session Note  Patient Details  Name: [name] MRN: [id number] Date of Birth: 01-16-1961  Today's Date: 01/31/2024 PT Individual Time: 0915-1010 PT Individual Time Calculation (min): 55 min   Short Term Goals: Week 2:  PT Short Term Goal 1 (Week 2): =LTGs d/t ELOS  Skilled Therapeutic Interventions/Progress Updates: Pt presented in bed agreeable to therapy. Pt states unrated pain during session, chronic and premedicated. Rest breaks provided as needed. Pt completed bed mobility mod I with use of bed features and increased time. Per pt has rail that she uses as headboard that she is able to grasp onto to. PTA donned TED hose, AFO, and shoes total A for time management. Discussed how she would put on AFO shoes at home with pt explaining would do while laying in bed. Discussed would possibly benefit from practicing during OT session prior to d/c. Pt then stood from elevated bed with rollator and ambulated ~116ft with rollator and supervision. Pt noted to ambulate with narrow BOS with intermittent scissoring however pt aware and would correct foot prior to advancing other LE. Pt then transported to ortho gym for time management. Completed car transfer to sedan height with RW and mod I with increased time. Discussed which vehicle pt planned on using for d/c with pt expressing could be truck or sedan. Advised would be more beneficial at this time to use sedan vs truck with pt indicating will try to talk to nephew prior to d/c. Pt then transported back to room and completed ambulatory transfer to recliner with RW and mod I with increased time. Pt left in recliner at end of session with call bell within reach and needs met.      Therapy Documentation Precautions:  Precautions Precautions: Fall, Back Precaution Booklet Issued: Yes (comment) Recall of Precautions/Restrictions: Impaired Precaution/Restrictions Comments: Spinal precautions-No back brace required. Required Braces or  Orthoses: Other Brace Other Brace: Pt with personal air cast on R ankle. Pt reports ankle instability for ~10 years from previous ankle dislocation. Restrictions Weight Bearing Restrictions Per Provider Order: No  Therapy/Group: Individual Therapy  Weston Kallman 01/31/2024, 12:33 PM

## 2024-01-31 NOTE — Progress Notes (Signed)
 PROGRESS NOTE   Subjective/Complaints:  Switched from Oxycodone  SR to Morphine  SR for insurance reasons, renal fxn reviewed and is good    ROS:    Pt denies SOB, abd pain, CP, N/V/C/D, and vision changes    Objective:   No results found. Recent Labs    01/29/24 0518  WBC 10.0  HGB 11.2*  HCT 37.0  PLT 282   Recent Labs    01/29/24 0518  NA 139  K 4.1  CL 102  CO2 27  GLUCOSE 104*  BUN 17  CREATININE 0.62  CALCIUM  9.2    Intake/Output Summary (Last 24 hours) at 01/31/2024 1037 Last data filed at 01/31/2024 0838 Gross per 24 hour  Intake 118 ml  Output --  Net 118 ml        Physical Exam: Vital Signs Blood pressure (!) 140/73, pulse 73, temperature 98.2 F (36.8 C), temperature source Oral, resp. rate 16, height 5\' 2"  (1.575 m), weight 95.2 kg, last menstrual period 11/20/2012, SpO2 100%.  General: No acute distress Mood and affect are appropriate Heart: Regular rate and rhythm no rubs murmurs or extra sounds Lungs: Clear to auscultation, breathing unlabored, no rales or wheezes Abdomen: Positive bowel sounds, soft nontender to palpation, nondistended   Extremities: trace to 1+ LLE to distal calf- no change this AM Psych: pleasant and cooperative  Skin: Back incision CDI with puckering around incision- has divot in tissue from prior back surgeries, but looks OK- Neuro:  Alert and oriented x 3. Normal insight and awareness. Intact Memory. Normal language and speech. Cranial nerve exam unremarkable. MMT: RUE 4+ to 5/5 and LUE 4-/5 to 4+/5 prox to distal, both remain limited by shoulder pain/DJD. RLE 2/5 prox to 2/5 ADF/PF limited by right ankle. LLE 3-/5 HF, KE and 3+ADF/PF. Decreased LT below the umbilicus but senses general touch and pain.. DTR's 1+ Musculoskeletal: DJD/limited ROM both shoulders L>>R. Right ankle with limited ADF/PF--30-50 degrees perhaps, chronic changes noted.    LB/midback still  tender.   AFO at bedside--PL-type, foot plate appears very large.  Assessment/Plan: 1. Functional deficits which require 3+ hours per day of interdisciplinary therapy in a comprehensive inpatient rehab setting. Physiatrist is providing close team supervision and 24 hour management of active medical problems listed below. Physiatrist and rehab team continue to assess barriers to discharge/monitor patient progress toward functional and medical goals  Care Tool:  Bathing        Body parts bathed by helper: Right arm, Left arm, Chest, Abdomen, Front perineal area, Buttocks, Right upper leg, Left upper leg, Face, Left lower leg, Right lower leg Body parts n/a: Right lower leg, Left lower leg   Bathing assist Assist Level: Set up assist     Upper Body Dressing/Undressing Upper body dressing   What is the patient wearing?: Pull over shirt    Upper body assist Assist Level: Independent with assistive device    Lower Body Dressing/Undressing Lower body dressing      What is the patient wearing?: Pants, Incontinence brief     Lower body assist Assist for lower body dressing: Supervision/Verbal cueing     Toileting Toileting    Toileting assist Assist  for toileting: Contact Guard/Touching assist     Transfers Chair/bed transfer  Transfers assist     Chair/bed transfer assist level: Supervision/Verbal cueing     Locomotion Ambulation   Ambulation assist      Assist level: Minimal Assistance - Patient > 75% Assistive device: Walker-rolling Max distance: 20 ft   Walk 10 feet activity   Assist     Assist level: Minimal Assistance - Patient > 75% Assistive device: Walker-rolling   Walk 50 feet activity   Assist Walk 50 feet with 2 turns activity did not occur: Safety/medical concerns         Walk 150 feet activity   Assist Walk 150 feet activity did not occur: Safety/medical concerns         Walk 10 feet on uneven surface   activity   Assist Walk 10 feet on uneven surfaces activity did not occur: Safety/medical concerns         Wheelchair     Assist Is the patient using a wheelchair?: Yes Type of Wheelchair: Manual    Wheelchair assist level: Dependent - Patient 0%      Wheelchair 50 feet with 2 turns activity    Assist        Assist Level: Dependent - Patient 0%   Wheelchair 150 feet activity     Assist      Assist Level: Dependent - Patient 0%   Blood pressure (!) 140/73, pulse 73, temperature 98.2 F (36.8 C), temperature source Oral, resp. rate 16, height 5\' 2"  (1.575 m), weight 95.2 kg, last menstrual period 11/20/2012, SpO2 100%.  Medical Problem List and Plan: 1. Functional deficits secondary to T9/10 myelopathy d/t severe spinal stenosis  and hx of T9 discitis/osteomyelitis             -patient may  shower--pat wound dry after shower.              -ELOS/Goals: 14-21 days, goals are mod I to supervision/min assist with PT and OT  D/c 4/28 Con't CIR PT and OT  -has an upright rolator- that has elbow rests and brakes up high Right AFO fits well into shoes after modification  Con't CIR PT and OT 2.  Antithrombotics: -DVT/anticoagulation:  Pharmaceutical: Lovenox              -antiplatelet therapy: N/A 3. Chronic Pain/Pain Management: Followed by [facility] on [address]/Dr. Wynelle Heather [facility]) --Gabapentin  1200 mg TID. Tylenol  prn.Parafon  500 mg qid.              --oxycodone  prn. Oxycontin  10 mg bid changed to MS COntin  15mg  BID for insurance reasons   4. Mood/Behavior/Sleep: LCSW to follow for evaluation and support.              -antipsychotic agents: N/A.              - Uses valium  for insomnia? Panic attacks? --Melatonin prn added-pt hasn't used yet -sleeping better/ See CPAP discussion below 5. Neuropsych/cognition: This patient is capable of making decisions on her own behalf. 6. Skin/Wound Care: vac now dc'ed.  - continue dry dressing (ABD  pad) to wound for now. 4/22- incision looks OK right now- some drainage, localized erythema 4/23- needs PICC line dressing changed again since it's open- let nursing know needs to be changed! Added gerhardt's per nursing request.  7. Fluids/Electrolytes/Nutrition: Monitor I/O.               --Vitamin supplement and prostat to  help promote wound healing.   8. Enterobacter/MSSA Lumbar infection: Continue Cefepime  while hospitalized and transition to Cipro  500 mg bid/Cefadroxil  1 gram bid at discharge.             --benadryl  50 mg tid prn allergies-->has been using couple of times a dayu             --follow up with Dr. Zelda Hickman  4/17 WBC's up to 12k today.    -she is afebrile, wound looks great   -no other concerning signs or sx at present  4/19- will check Monday  4/22- WBC 9.6 down from 12k  4/24- WBC 10.0k 9. Recurrent Hypokalemia:  -4/17 today's level up stable at 3.8              --continue daily supplement    4/24- K+ 4.1 10.H/o IBS- C: Was not taking iron at home--will d/c.             --Black stools due to iron supplement.  -- Continue Amitiza  and order stool guaiacs.  -there is also likely a component of neurogenic bowel as patient was having incontinence PTA (2 years) 4/17 pt with another incontinent bowel movement yesterday.   -will start pm dulcolax suppository tonight  -continue amitiza  and am senna 4/18 LBM yesterday, Bms often documented as hard, add miralax  daily 4/21--LBM 4/20. She is refusing PM suppository.  She feels that continence is better, bowels more regular, will change supp to PRN 4/22- LBM 2 days ago- asked ofr daily miralax  this AM 4/23- LBM x6 yesterday- feeling much better 4/24 LBM 2 days ago when was cleaned out 4/25- LBM yesterday 11. HTN: Monitor BP TID --on Amlodipine , tenormin  and Chlorthalidone . --likely need potassium supplement due to chlorthalidone .      01/30/2024    7:23 PM 01/30/2024    4:23 PM 01/30/2024    6:25 AM  Vitals with BMI   Systolic 140 139 696  Diastolic 73 65 77  Pulse 73 75 72  Intermittently elevated, continue to monitor trend for now  4/19- BP running elevated 150's to 170's today- on Atenolol  50 mg daily- if still elevated tomorrow, will increase Atenolol     4/22- BP running 140s-150s still- will increase Norvasc  to 10 mg since HR in low 70's  4/23- Norvasc  increase starting this AM  4/24- BP looking 130s-140s- so doing better- con't regimen with increased norvasc   4/25- BP overall controlled, but slightly elevated in AM- con't regimen and monitoring 13. Neurogenic Bladder/ incontinence/frequency: Has been incontinent of bladder at nights and during the day.  -4/16 still no pvr's scanned despite mutliple requests! - I/O cath for vol >350cc -ongoing intermittent incontinence   -4/17 PVR not elevated, will try timed voids, consider anticholinergic  4/21- PVR's all low with one exception. Continue timed voids,   -will add low dose myrbetriq  and see how she does. 4/22- no significant improvement yet per pt 4/23- pt has chronic OAB 14. OSA: Uses CPAP at home-- pt using, she asked family to bring nasal unit from home as she's struggling with nasal mask hospital provided.  -4/18-4/19 Discussed asking family again to try to bring home unit- pt says will try to get nephew to bring in.   4/21 pt has home unit/nasal prongs working much better!  4/23- slept 7 hours last night! 15. Allergies  4/23- asking for Benadryl  q6 hours scheduled- will try Claritin  daily first.  16. LLE edema  4/24- goes away with elevation- spent a long time with  it hanging down yesterday- advised to elevate more and wear TEDs as she's been doing    LOS: 12 days A FACE TO FACE EVALUATION WAS PERFORMED  Genetta Kenning 01/31/2024, 10:37 AM

## 2024-02-01 MED ORDER — NALOXONE HCL 4 MG/0.1ML NA LIQD
1.0000 | Freq: Once | NASAL | 0 refills | Status: DC | PRN
  Filled 2024-02-01: qty 2, 15d supply, fill #0

## 2024-02-01 MED ORDER — CHLORTHALIDONE 25 MG PO TABS
25.0000 mg | ORAL_TABLET | Freq: Every day | ORAL | 0 refills | Status: DC
  Filled 2024-02-01: qty 30, 30d supply, fill #0

## 2024-02-01 NOTE — Plan of Care (Signed)
  Problem: RH Bathing Goal: LTG Patient will bathe all body parts with assist levels (OT) Description: LTG: Patient will bathe all body parts with assist levels (OT) Outcome: Completed/Met Flowsheets (Taken 01/20/2024 1608 by Essenmacher, Ocie Belt, OT) LTG: Pt will perform bathing with assistance level/cueing: Independent with assistive device    Problem: RH Dressing Goal: LTG Patient will perform upper body dressing (OT) Description: LTG Patient will perform upper body dressing with assist, with/without cues (OT). Outcome: Completed/Met Flowsheets (Taken 01/20/2024 1608 by Essenmacher, Ocie Belt, OT) LTG: Pt will perform upper body dressing with assistance level of: Independent with assistive device Goal: LTG Patient will perform lower body dressing w/assist (OT) Description: LTG: Patient will perform lower body dressing with assist, with/without cues in positioning using equipment (OT) Outcome: Completed/Met Flowsheets (Taken 01/20/2024 1608 by Essenmacher, Ocie Belt, OT) LTG: Pt will perform lower body dressing with assistance level of: Independent with assistive device   Problem: RH Toileting Goal: LTG Patient will perform toileting task (3/3 steps) with assistance level (OT) Description: LTG: Patient will perform toileting task (3/3 steps) with assistance level (OT)  Outcome: Completed/Met Flowsheets (Taken 01/20/2024 1608 by Essenmacher, Ocie Belt, OT) LTG: Pt will perform toileting task (3/3 steps) with assistance level: Independent with assistive device   Problem: RH Light Housekeeping Goal: LTG Patient will perform light housekeeping w/assist (OT) Description: LTG: Patient will perform light housekeeping with assistance, with/without cues (OT). Outcome: Completed/Met Flowsheets (Taken 01/20/2024 1608 by Essenmacher, Ocie Belt, OT) LTG: Pt will perform light housekeeping with assistance level of: Supervision/Verbal cueing   Problem: RH Toilet Transfers Goal: LTG Patient will perform  toilet transfers w/assist (OT) Description: LTG: Patient will perform toilet transfers with assist, with/without cues using equipment (OT) Outcome: Completed/Met Flowsheets (Taken 01/20/2024 1608 by Essenmacher, Ocie Belt, OT) LTG: Pt will perform toilet transfers with assistance level of: Independent with assistive device   Problem: RH Tub/Shower Transfers Goal: LTG Patient will perform tub/shower transfers w/assist (OT) Description: LTG: Patient will perform tub/shower transfers with assist, with/without cues using equipment (OT) Outcome: Completed/Met Flowsheets (Taken 01/20/2024 1608 by Essenmacher, Ocie Belt, OT) LTG: Pt will perform tub/shower stall transfers with assistance level of: Independent with assistive device

## 2024-02-01 NOTE — Progress Notes (Signed)
 Inpatient Rehabilitation Discharge Medication Review by a Pharmacist  A complete drug regimen review was completed for this patient to identify any potential clinically significant medication issues.  High Risk Drug Classes Is patient taking? Indication by Medication  Antipsychotic No   Anticoagulant No   Antibiotic Yes Cefadroxil /Cipro  for at least a year: discitis/osteomyelitis with epidural abscess/phlegmon   Opioid Yes Morphine /oxycodone : pain  Antiplatelet Yes Aspirin : CVA ppx  Hypoglycemics/insulin  No   Vasoactive Medication Yes Amlodipine , atenolol , chlorthalidone : BP  Chemotherapy No   Other Yes vitC, vitD, B12, KCl: supplement Chlorzoxazone , Robaxin : muscle spasms Diazepam : insomnia Gabapentin : pain Lubiprostone : IBS-C Prilosec: GERD Tylenol : pain Albuterol : wheezing/SOB Senna /miralax  - bowel regimen Eucerin cream: skin care Mirabegron  - OAB     Type of Medication Issue Identified Description of Issue Recommendation(s)  Drug Interaction(s) (clinically significant)     Duplicate Therapy     Allergy     No Medication Administration End Date     Incorrect Dose     Additional Drug Therapy Needed     Significant med changes from prior encounter (inform family/care partners about these prior to discharge).    Other       Clinically significant medication issues were identified that warrant physician communication and completion of prescribed/recommended actions by midnight of the next day:  No   Time spent performing this drug regimen review (minutes): [address] PharmD, [place], AAHIVP, CPP Infectious Disease Pharmacist 02/01/2024 2:01 PM

## 2024-02-01 NOTE — Plan of Care (Signed)
  Problem: RH Ambulation Goal: LTG Patient will ambulate in home environment (PT) Description: LTG: Patient will ambulate in home environment, # of feet with assistance (PT). Outcome: Adequate for Discharge   Problem: RH Balance Goal: LTG Patient will maintain dynamic sitting balance (PT) Description: LTG:  Patient will maintain dynamic sitting balance with assistance during mobility activities (PT) Outcome: Completed/Met Goal: LTG Patient will maintain dynamic standing balance (PT) Description: LTG:  Patient will maintain dynamic standing balance with assistance during mobility activities (PT) Outcome: Completed/Met   Problem: Sit to Stand Goal: LTG:  Patient will perform sit to stand with assistance level (PT) Description: LTG:  Patient will perform sit to stand with assistance level (PT) Outcome: Completed/Met   Problem: RH Bed Mobility Goal: LTG Patient will perform bed mobility with assist (PT) Description: LTG: Patient will perform bed mobility with assistance, with/without cues (PT). Outcome: Completed/Met   Problem: RH Bed to Chair Transfers Goal: LTG Patient will perform bed/chair transfers w/assist (PT) Description: LTG: Patient will perform bed to chair transfers with assistance (PT). Outcome: Completed/Met   Problem: RH Car Transfers Goal: LTG Patient will perform car transfers with assist (PT) Description: LTG: Patient will perform car transfers with assistance (PT). Outcome: Completed/Met   Problem: RH Ambulation Goal: LTG Patient will ambulate in controlled environment (PT) Description: LTG: Patient will ambulate in a controlled environment, # of feet with assistance (PT). Outcome: Completed/Met   Problem: RH Stairs Goal: LTG Patient will ambulate up and down stairs w/assist (PT) Description: LTG: Patient will ambulate up and down # of stairs with assistance (PT) Outcome: Completed/Met

## 2024-02-01 NOTE — Progress Notes (Signed)
 PROGRESS NOTE   Subjective/Complaints:  Switched from Oxycodone  SR to Morphine  SR for insurance reasons, renal fxn reviewed and is good  Pt states pain control is equal between the 2 meds   ROS:    Pt denies SOB, abd pain, CP, N/V/C/D, and vision changes    Objective:   No results found. No results for input(s): "WBC", "HGB", "HCT", "PLT" in the last 72 hours.  No results for input(s): "NA", "K", "CL", "CO2", "GLUCOSE", "BUN", "CREATININE", "CALCIUM " in the last 72 hours.   Intake/Output Summary (Last 24 hours) at 02/01/2024 0923 Last data filed at 02/01/2024 0814 Gross per 24 hour  Intake 236.5 ml  Output --  Net 236.5 ml        Physical Exam: Vital Signs Blood pressure (!) 148/79, pulse 75, temperature (!) 97.5 F (36.4 C), temperature source Oral, resp. rate 18, height 5\' 2"  (1.575 m), weight 99.4 kg, last menstrual period 11/20/2012, SpO2 99%.  General: No acute distress Mood and affect are appropriate Heart: Regular rate and rhythm no rubs murmurs or extra sounds Lungs: Clear to auscultation, breathing unlabored, no rales or wheezes Abdomen: Positive bowel sounds, soft nontender to palpation, nondistended  Extremities: trace to 1+ LLE to distal calf- no change this AM Psych: pleasant and cooperative  Skin: Back incision CDI with puckering around incision- has divot in tissue from prior back surgeries, but looks OK- Neuro:  Alert and oriented x 3. Normal insight and awareness. Intact Memory. Normal language and speech. Cranial nerve exam unremarkable. MMT: RUE 4+ to 5/5 and LUE 4-/5 to 4+/5 prox to distal, both remain limited by shoulder pain/DJD. RLE 2/5 prox to 2/5 ADF/PF limited by right ankle. LLE 3-/5 HF, KE and 3+ADF/PF. Decreased LT below the umbilicus but senses general touch and pain.. DTR's 1+ Musculoskeletal: DJD/limited ROM both shoulders L>>R. Right ankle with limited ADF/PF--30-50 degrees perhaps,  chronic changes noted.    LB/midback still tender.   AFO at bedside--PL-type, foot plate appears very large.  Assessment/Plan: 1. Functional deficits which require 3+ hours per day of interdisciplinary therapy in a comprehensive inpatient rehab setting. Physiatrist is providing close team supervision and 24 hour management of active medical problems listed below. Physiatrist and rehab team continue to assess barriers to discharge/monitor patient progress toward functional and medical goals  Care Tool:  Bathing        Body parts bathed by helper: Right arm, Left arm, Chest, Abdomen, Front perineal area, Buttocks, Right upper leg, Left upper leg, Face, Left lower leg, Right lower leg Body parts n/a: Right lower leg, Left lower leg   Bathing assist Assist Level: Set up assist     Upper Body Dressing/Undressing Upper body dressing   What is the patient wearing?: Pull over shirt    Upper body assist Assist Level: Independent with assistive device    Lower Body Dressing/Undressing Lower body dressing      What is the patient wearing?: Pants, Incontinence brief     Lower body assist Assist for lower body dressing: Supervision/Verbal cueing     Toileting Toileting    Toileting assist Assist for toileting: Contact Guard/Touching assist     Transfers Chair/bed transfer  Transfers assist     Chair/bed transfer assist level: Independent with assistive device     Locomotion Ambulation   Ambulation assist      Assist level: Supervision/Verbal cueing Assistive device: Rollator Max distance: 100   Walk 10 feet activity   Assist     Assist level: Supervision/Verbal cueing Assistive device: Walker-rolling   Walk 50 feet activity   Assist Walk 50 feet with 2 turns activity did not occur: Safety/medical concerns  Assist level: Supervision/Verbal cueing Assistive device: Walker-rolling    Walk 150 feet activity   Assist Walk 150 feet activity did not  occur: Safety/medical concerns         Walk 10 feet on uneven surface  activity   Assist Walk 10 feet on uneven surfaces activity did not occur: Safety/medical concerns         Wheelchair     Assist Is the patient using a wheelchair?: Yes Type of Wheelchair: Manual    Wheelchair assist level: Supervision/Verbal cueing Max wheelchair distance: 180ft    Wheelchair 50 feet with 2 turns activity    Assist        Assist Level: Supervision/Verbal cueing   Wheelchair 150 feet activity     Assist      Assist Level: Supervision/Verbal cueing   Blood pressure (!) 148/79, pulse 75, temperature (!) 97.5 F (36.4 C), temperature source Oral, resp. rate 18, height 5\' 2"  (1.575 m), weight 99.4 kg, last menstrual period 11/20/2012, SpO2 99%.  Medical Problem List and Plan: 1. Functional deficits secondary to T9/10 myelopathy d/t severe spinal stenosis  and hx of T9 discitis/osteomyelitis             -patient may  shower--pat wound dry after shower.              -ELOS/Goals: 14-21 days, goals are mod I to supervision/min assist with PT and OT  D/c 4/28 Con't CIR PT and OT  -has an upright rolator- that has elbow rests and brakes up high Right AFO fits well into shoes after modification  Con't CIR PT and OT 2.  Antithrombotics: -DVT/anticoagulation:  Pharmaceutical: Lovenox              -antiplatelet therapy: N/A 3. Chronic Pain/Pain Management: Followed by [facility] on [address]/Dr. Wynelle Heather [facility]) --Gabapentin  1200 mg TID. Tylenol  prn.Parafon  500 mg qid.              --oxycodone  prn. Oxycontin  10 mg bid changed to MS COntin  15mg  BID for insurance reasons   4. Mood/Behavior/Sleep: LCSW to follow for evaluation and support.              -antipsychotic agents: N/A.              - Uses valium  for insomnia? Panic attacks? --Melatonin prn added-pt hasn't used yet -sleeping better/ See CPAP discussion below 5. Neuropsych/cognition: This patient is  capable of making decisions on her own behalf. 6. Skin/Wound Care: vac now dc'ed.  - continue dry dressing (ABD pad) to wound for now. 4/22- incision looks OK right now- some drainage, localized erythema 4/23- needs PICC line dressing changed again since it's open- let nursing know needs to be changed! Added gerhardt's per nursing request.  7. Fluids/Electrolytes/Nutrition: Monitor I/O.               --Vitamin supplement and prostat to help promote wound healing.   8. Enterobacter/MSSA Lumbar infection: Continue Cefepime  while hospitalized and transition to Cipro  500 mg  bid/Cefadroxil  1 gram bid at discharge.             --benadryl  50 mg tid prn allergies-->has been using couple of times a dayu             --follow up with Dr. Zelda Hickman  4/17 WBC's up to 12k today.    -she is afebrile, wound looks great   -no other concerning signs or sx at present  4/19- will check Monday  4/22- WBC 9.6 down from 12k  4/24- WBC 10.0k 4/27 remains afebrile 9. Recurrent Hypokalemia:  -4/17 today's level up stable at 3.8              --continue daily supplement    4/24- K+ 4.1 10.H/o IBS- C: Was not taking iron at home--will d/c.             --Black stools due to iron supplement.  -- Continue Amitiza  and order stool guaiacs.  -there is also likely a component of neurogenic bowel as patient was having incontinence PTA (2 years) 4/27 pt reports BM yesterday  11. HTN: Monitor BP TID --on Amlodipine , tenormin  and Chlorthalidone . --likely need potassium supplement due to chlorthalidone .      02/01/2024    6:14 AM 02/01/2024    4:52 AM 01/31/2024    7:45 PM  Vitals with BMI  Weight 219 lbs 2 oz    BMI 40.07    Systolic 148  154  Diastolic 79  85  Pulse  75 71  Intermittently elevated, continue to monitor trend for now  4/19- BP running elevated 150's to 170's today- on Atenolol  50 mg daily- if still elevated tomorrow, will increase Atenolol     4/22- BP running 140s-150s still- will increase Norvasc  to 10  mg since HR in low 70's  4/23- Norvasc  increase starting this AM  4/24- BP looking 130s-140s- so doing better- con't regimen with increased norvasc   4/25- BP overall controlled, but slightly elevated in AM- con't regimen and monitoring 4/27 BP improved 13. Neurogenic Bladder/ incontinence/frequency: Has been incontinent of bladder at nights and during the day.  -4/16 still no pvr's scanned despite mutliple requests! - I/O cath for vol >350cc -ongoing intermittent incontinence   -4/17 PVR not elevated, will try timed voids, consider anticholinergic  4/21- PVR's all low with one exception. Continue timed voids,   -will add low dose myrbetriq  and see how she does. 4/22- no significant improvement yet per pt 4/23- pt has chronic OAB 14. OSA: Uses CPAP at home-- pt using, she asked family to bring nasal unit from home as she's struggling with nasal mask hospital provided.  -4/18-4/19 Discussed asking family again to try to bring home unit- pt says will try to get nephew to bring in.   4/21 pt has home unit/nasal prongs working much better!  4/23- slept 7 hours last night! 15. Allergies  4/23- asking for Benadryl  q6 hours scheduled- will try Claritin  daily first.  16. LLE edema  4/24- goes away with elevation- spent a long time with it hanging down yesterday- advised to elevate more and wear TEDs as she's been doing    LOS: 13 days A FACE TO FACE EVALUATION WAS PERFORMED  Genetta Kenning 02/01/2024, 9:23 AM

## 2024-02-01 NOTE — Progress Notes (Signed)
 canceled

## 2024-02-01 NOTE — Progress Notes (Signed)
 Occupational Therapy Discharge Summary  Patient Details  Name: [name] MRN: [id number] Date of Birth: Mar 04, 1961  Date of Discharge from OT service:February 01, 2024  Today's Date: 02/01/2024 OT Individual Time: 0865-7846 OT Individual Time Calculation (min): 69 min    Patient has met 7 of 7 long term goals due to improved activity tolerance, improved balance, postural control, ability to compensate for deficits, and improved awareness.  Patient to discharge at overall Modified Independent level.  Patient's care partner is independent to provide the necessary  intermittent supervision  assistance at discharge.    Reasons goals not met: All goals met  Recommendation:  Patient will benefit from ongoing skilled OT services in home health setting to continue to advance functional skills in the area of BADL and iADL.  Equipment: Pt has all necessary equipment  Reasons for discharge: treatment goals met and discharge from hospital  Patient/family agrees with progress made and goals achieved: Yes  OT Discharge Precautions/Restrictions  Precautions Precautions: Fall;Back Recall of Precautions/Restrictions: Intact-able to verbally recall 3/3 precautions Precaution/Restrictions Comments: Spinal precautions-No back brace required. Required Braces or Orthoses: Other Brace Other Brace: Pt with AFO on  LE Restrictions Weight Bearing Restrictions Per Provider Order: No ADL ADL Equipment Provided: Reacher, Sock aid, Long-handled sponge Eating: Independent Where Assessed-Eating: Bed level Grooming: Modified independent Where Assessed-Grooming: Sitting at sink Upper Body Bathing: Modified independent Where Assessed-Upper Body Bathing: Shower Lower Body Bathing: Modified independent Where Assessed-Lower Body Bathing: Shower Upper Body Dressing: Modified independent (Device) Where Assessed-Upper Body Dressing: Edge of bed Lower Body Dressing: Modified independent Where  Assessed-Lower Body Dressing: Bed level Toileting: Modified independent Where Assessed-Toileting: Bedside Commode Toilet Transfer: Modified independent Toilet Transfer Method: Surveyor, minerals: Gaffer: Modified independent Web designer Method: Engineer, technical sales: Insurance underwriter: Modified independent Film/video editor Method: Warden/ranger: Emergency planning/management officer, Grab bars Vision Baseline Vision/History: 1 Wears glasses Patient Visual Report: No change from baseline Vision Assessment?: No apparent visual deficits Perception  Perception: Within Functional Limits Praxis Praxis: WFL Cognition Cognition Overall Cognitive Status: Within Functional Limits for tasks assessed Arousal/Alertness: Awake/alert Orientation Level: Person;Place;Situation Memory: Appears intact Awareness: Appears intact Problem Solving: Appears intact Safety/Judgment: Appears intact Brief Interview for Mental Status (BIMS) Repetition of Three Words (First Attempt): 3 Temporal Orientation: Year: Correct Temporal Orientation: Month: Accurate within 5 days Temporal Orientation: Day: Correct Recall: "Sock": Yes, no cue required Recall: "Blue": Yes, no cue required Recall: "Bed": Yes, no cue required BIMS Summary Score: 15 Sensation Sensation Light Touch: Impaired by gross assessment Hot/Cold: Appears Intact Proprioception: Appears Intact Stereognosis: Appears Intact Additional Comments: nueropathy in B hands and feet, and decreased sensation in abdomen from surgery in early 2000s; pt able to detect light touch in all dermatomes, pt reports mild N&T in B LE, and constant N&T in B UE Coordination Gross Motor Movements are Fluid and Coordinated: Yes Fine Motor Movements are Fluid and Coordinated: Yes Motor  Motor Motor: Within Functional Limits Mobility  Bed Mobility Bed Mobility:  Supine to Sit;Rolling Right;Rolling Left;Sit to Supine Rolling Right: Independent with assistive device Rolling Left: Independent with assistive device Supine to Sit: Independent with assistive device Sitting - Scoot to Edge of Bed: Independent with assistive device Sit to Supine: Independent with assistive device Transfers Sit to Stand: Independent with assistive device Stand to Sit: Independent with assistive device  Trunk/Postural Assessment  Cervical Assessment Cervical Assessment: Within Functional Limits Thoracic Assessment Thoracic Assessment:  Within Functional Limits Lumbar Assessment Lumbar Assessment: Within Functional Limits Postural Control Postural Control: Within Functional Limits  Balance Balance Balance Assessed: Yes Static Sitting Balance Static Sitting - Balance Support: No upper extremity supported;Feet supported Static Sitting - Level of Assistance: 6: Modified independent (Device/Increase time) Dynamic Sitting Balance Dynamic Sitting - Balance Support: No upper extremity supported;During functional activity;Feet supported Dynamic Sitting - Level of Assistance: 6: Modified independent (Device/Increase time) Dynamic Sitting - Balance Activities: Reaching for objects;Trunk control activities;Forward lean/weight shifting Static Standing Balance Static Standing - Balance Support: Bilateral upper extremity supported;During functional activity Static Standing - Level of Assistance: 6: Modified independent (Device/Increase time) Dynamic Standing Balance Dynamic Standing - Balance Support: Bilateral upper extremity supported;During functional activity Dynamic Standing - Level of Assistance: 6: Modified independent (Device/Increase time) Extremity/Trunk Assessment RUE Assessment RUE Assessment: Within Functional Limits LUE Assessment LUE Assessment: Within Functional Limits   Tx Session General: "You are all wonderful" Pt seated in recliner upon OT arrival,  agreeable to OT.  Pain: no pain reported  ADL: OT providing insight and education on donning/doffing AFO at bed level before getting out of bed for the day for increased balance and ability to complete figure 4 while in bed d/t pt having bed rails on personal bed. OT issued elastic show laces for increased independence of donning shoes and educated pt on purchasing long handle shoe horn for increased independence with tasks once D/C and OT assisting with donning onto shoes. OT providing insight on energy conservation techniques during ADL participation once D/C. Pt left with no comments/concerns for D/C.    Pt seated in recliner at end of session with W/C alarm donned, call light within reach and 4Ps assessed.    Nila Barth, OTD, OTR/L 02/01/2024, 12:52 PM

## 2024-02-02 ENCOUNTER — Other Ambulatory Visit (HOSPITAL_COMMUNITY): Payer: Self-pay

## 2024-02-02 LAB — CBC
HCT: 35 % — ABNORMAL LOW (ref 36.0–46.0)
Hemoglobin: 10.6 g/dL — ABNORMAL LOW (ref 12.0–15.0)
MCH: 25.4 pg — ABNORMAL LOW (ref 26.0–34.0)
MCHC: 30.3 g/dL (ref 30.0–36.0)
MCV: 83.7 fL (ref 80.0–100.0)
Platelets: 248 10*3/uL (ref 150–400)
RBC: 4.18 MIL/uL (ref 3.87–5.11)
RDW: 14.8 % (ref 11.5–15.5)
WBC: 9.1 10*3/uL (ref 4.0–10.5)
nRBC: 0 % (ref 0.0–0.2)

## 2024-02-02 LAB — BASIC METABOLIC PANEL WITH GFR
Anion gap: 10 (ref 5–15)
BUN: 17 mg/dL (ref 8–23)
CO2: 25 mmol/L (ref 22–32)
Calcium: 9.3 mg/dL (ref 8.9–10.3)
Chloride: 105 mmol/L (ref 98–111)
Creatinine, Ser: 0.74 mg/dL (ref 0.44–1.00)
GFR, Estimated: >60 mL/min (ref >60)
Glucose, Bld: 136 mg/dL — ABNORMAL HIGH (ref 70–99)
Potassium: 3.4 mmol/L — ABNORMAL LOW (ref 3.5–5.1)
Sodium: 140 mmol/L (ref 135–145)

## 2024-02-02 MED ORDER — POTASSIUM CHLORIDE CRYS ER 20 MEQ PO TBCR
20.0000 meq | EXTENDED_RELEASE_TABLET | Freq: Every day | ORAL | 0 refills | Status: DC
  Filled 2024-02-02: qty 30, 30d supply, fill #0

## 2024-02-02 MED ORDER — POTASSIUM CHLORIDE CRYS ER 20 MEQ PO TBCR: 20.0000 meq | EXTENDED_RELEASE_TABLET | Freq: Every day | ORAL | 0 refills | Status: DC

## 2024-02-02 MED ORDER — POTASSIUM CHLORIDE CRYS ER 20 MEQ PO TBCR
20.0000 meq | EXTENDED_RELEASE_TABLET | Freq: Every day | ORAL | Status: DC
  Administered 2024-02-02: 20 meq via ORAL
  Filled 2024-02-02: qty 1

## 2024-02-02 MED ORDER — NALOXONE HCL 4 MG/0.1ML NA LIQD: 1.0000 | Freq: Once | NASAL | 0 refills | Status: AC | PRN

## 2024-02-02 MED ORDER — POTASSIUM CHLORIDE CRYS ER 20 MEQ PO TBCR
20.0000 meq | EXTENDED_RELEASE_TABLET | Freq: Every day | ORAL | Status: DC
Start: 2024-02-03 — End: 2024-02-02

## 2024-02-02 MED ORDER — DIAZEPAM 5 MG PO TABS: 5.0000 mg | ORAL_TABLET | Freq: Every day | ORAL | Status: DC

## 2024-02-02 MED ORDER — SODIUM CHLORIDE 0.9 % IV SOLN: 2.0000 g | Freq: Three times a day (TID) | INTRAVENOUS | Status: DC

## 2024-02-02 NOTE — Progress Notes (Signed)
 PROGRESS NOTE   Subjective/Complaints:  Pt reports having itching from MS Contin  but with benadryl , it's helpful and itching somewhat better.  Says cannot tolerate Melatonin since it makes her ocnfused.   K+ 3.4 this AM  ROS:    Pt denies SOB, abd pain, CP, N/V/C/D, and vision changes    Objective:   No results found. Recent Labs    02/02/24 0212  WBC 9.1  HGB 10.6*  HCT 35.0*  PLT 248    Recent Labs    02/02/24 0212  NA 140  K 3.4*  CL 105  CO2 25  GLUCOSE 136*  BUN 17  CREATININE 0.74  CALCIUM  9.3     Intake/Output Summary (Last 24 hours) at 02/02/2024 0816 Last data filed at 02/01/2024 1326 Gross per 24 hour  Intake 237 ml  Output --  Net 237 ml        Physical Exam: Vital Signs Blood pressure 139/65, pulse 68, temperature 98 F (36.7 C), temperature source Oral, resp. rate 16, height 5\' 2"  (1.575 m), weight 99.4 kg, last menstrual period 11/20/2012, SpO2 99%.    General: awake, alert, appropriate, sitting up in bed; NAD HENT: conjugate gaze; oropharynx moist CV: regular rate and rhythm; no JVD Pulmonary: CTA B/L; no W/R/R- good air movement GI: soft, NT, ND, (+)BS- protuberant Psychiatric: appropriate Neurological: Ox3  Extremities: no LE edema this AM Psych: pleasant and cooperative  Skin: Back incision CDI with puckering around incision- has divot in tissue from prior back surgeries, but looks OK- Neuro:  Alert and oriented x 3. Normal insight and awareness. Intact Memory. Normal language and speech. Cranial nerve exam unremarkable. MMT: RUE 4+ to 5/5 and LUE 4-/5 to 4+/5 prox to distal, both remain limited by shoulder pain/DJD. RLE 2/5 prox to 2/5 ADF/PF limited by right ankle. LLE 3-/5 HF, KE and 3+ADF/PF. Decreased LT below the umbilicus but senses general touch and pain.. DTR's 1+ Musculoskeletal: DJD/limited ROM both shoulders L>>R. Right ankle with limited ADF/PF--30-50 degrees  perhaps, chronic changes noted.    LB/midback still tender.   AFO at bedside--PL-type, foot plate appears very large.  Assessment/Plan: 1. Functional deficits which require 3+ hours per day of interdisciplinary therapy in a comprehensive inpatient rehab setting. Physiatrist is providing close team supervision and 24 hour management of active medical problems listed below. Physiatrist and rehab team continue to assess barriers to discharge/monitor patient progress toward functional and medical goals  Care Tool:  Bathing        Body parts bathed by helper: Right arm, Left arm, Chest, Abdomen, Front perineal area, Buttocks, Right upper leg, Left upper leg, Face, Left lower leg, Right lower leg Body parts n/a: Right lower leg, Left lower leg   Bathing assist Assist Level: Independent with assistive device     Upper Body Dressing/Undressing Upper body dressing   What is the patient wearing?: Pull over shirt    Upper body assist Assist Level: Independent with assistive device    Lower Body Dressing/Undressing Lower body dressing      What is the patient wearing?: Pants, Incontinence brief     Lower body assist Assist for lower body dressing: Independent with assitive  device     Toileting Toileting    Toileting assist Assist for toileting: Independent with assistive device     Transfers Chair/bed transfer  Transfers assist     Chair/bed transfer assist level: Independent with assistive device     Locomotion Ambulation   Ambulation assist      Assist level: Independent with assistive device Assistive device: Rollator Max distance: 160   Walk 10 feet activity   Assist     Assist level: Independent with assistive device Assistive device: Rollator   Walk 50 feet activity   Assist Walk 50 feet with 2 turns activity did not occur: Safety/medical concerns  Assist level: Independent with assistive device Assistive device: Rollator    Walk 150 feet  activity   Assist Walk 150 feet activity did not occur: Safety/medical concerns  Assist level: Independent with assistive device Assistive device: Rollator    Walk 10 feet on uneven surface  activity   Assist Walk 10 feet on uneven surfaces activity did not occur: Safety/medical concerns   Assist level: Contact Guard/Touching assist Assistive device: Rollator   Wheelchair     Assist Is the patient using a wheelchair?: Yes Type of Wheelchair: Manual    Wheelchair assist level: Supervision/Verbal cueing Max wheelchair distance: 166ft    Wheelchair 50 feet with 2 turns activity    Assist        Assist Level: Supervision/Verbal cueing   Wheelchair 150 feet activity     Assist      Assist Level: Supervision/Verbal cueing   Blood pressure 139/65, pulse 68, temperature 98 F (36.7 C), temperature source Oral, resp. rate 16, height 5\' 2"  (1.575 m), weight 99.4 kg, last menstrual period 11/20/2012, SpO2 99%.  Medical Problem List and Plan: 1. Functional deficits secondary to T9/10 myelopathy d/t severe spinal stenosis  and hx of T9 discitis/osteomyelitis             -patient may  shower--pat wound dry after shower.              -ELOS/Goals: 14-21 days, goals are mod I to supervision/min assist with PT and OT  D/c 4/28 Con't CIR PT and OT  -has an upright rolator- that has elbow rests and brakes up high Right AFO fits well into shoes after modification  D/C today- will need to go home with PICC! 2.  Antithrombotics: -DVT/anticoagulation:  Pharmaceutical: Lovenox  4/28- won't need to go home on Lovenox              -antiplatelet therapy: N/A 3. Chronic Pain/Pain Management: Followed by [facility] [address]/Dr. Wynelle Heather [facility]) --Gabapentin  1200 mg TID. Tylenol  prn.Parafon  500 mg qid.              --oxycodone  prn. Oxycontin  10 mg bid changed to MS COntin  15mg  BID for insurance reasons  4/28- is tolerating MS Contin  with benadryl  for  itching 4. Mood/Behavior/Sleep: LCSW to follow for evaluation and support.              -antipsychotic agents: N/A.              - Uses valium  for insomnia? Panic attacks? --Melatonin prn added-pt hasn't used yet -sleeping better/ See CPAP discussion below 5. Neuropsych/cognition: This patient is capable of making decisions on her own behalf. 6. Skin/Wound Care: vac now dc'ed.  - continue dry dressing (ABD pad) to wound for now. 4/22- incision looks OK right now- some drainage, localized erythema 4/23- needs PICC line dressing changed  again since it's open- let nursing know needs to be changed! Added gerhardt's per nursing request.  4/28- pt loves Gerdardt's- explained we cannot send home Rx most likely for it, because I think it's compounded here, but can home with current jar of it.  7. Fluids/Electrolytes/Nutrition: Monitor I/O.               --Vitamin supplement and prostat to help promote wound healing.   8. Enterobacter/MSSA Lumbar infection: Continue Cefepime  while hospitalized and transition to Cipro  500 mg bid/Cefadroxil  1 gram bid at discharge.             --benadryl  50 mg tid prn allergies-->has been using couple of times a dayu             --follow up with Dr. Zelda Hickman  4/17 WBC's up to 12k today.    -she is afebrile, wound looks great   -no other concerning signs or sx at present  4/19- will check Monday  4/22- WBC 9.6 down from 12k  4/24- WBC 10.0k 4/27 remains afebrile  4/28- WBC 9.1k- home home on IV ABX 9. Recurrent Hypokalemia:  -4/17 today's level up stable at 3.8              --continue daily supplement    4/24- K+ 4.1 10.H/o IBS- C: Was not taking iron at home--will d/c.             --Black stools due to iron supplement.  -- Continue Amitiza  and order stool guaiacs.  -there is also likely a component of neurogenic bowel as patient was having incontinence PTA (2 years) 4/27 pt reports BM yesterday  11. HTN: Monitor BP TID --on Amlodipine , tenormin  and  Chlorthalidone . --likely need potassium supplement due to chlorthalidone .      02/02/2024    5:40 AM 02/01/2024    7:54 PM 02/01/2024    1:22 PM  Vitals with BMI  Systolic 139 130 161  Diastolic 65 62 78  Pulse 68 72 68  Intermittently elevated, continue to monitor trend for now  4/19- BP running elevated 150's to 170's today- on Atenolol  50 mg daily- if still elevated tomorrow, will increase Atenolol     4/22- BP running 140s-150s still- will increase Norvasc  to 10 mg since HR in low 70's  4/23- Norvasc  increase starting this AM  4/24- BP looking 130s-140s- so doing better- con't regimen with increased norvasc   4/25- BP overall controlled, but slightly elevated in AM- con't regimen and monitoring 4/27 BP improved 4/28- Pt's K+ 3.4- will increase her KCL to 20 mEq daily for repletion; BP controlled- con't regimen  13. Neurogenic Bladder/ incontinence/frequency: Has been incontinent of bladder at nights and during the day.  -4/16 still no pvr's scanned despite mutliple requests! - I/O cath for vol >350cc -ongoing intermittent incontinence   -4/17 PVR not elevated, will try timed voids, consider anticholinergic  4/21- PVR's all low with one exception. Continue timed voids,   -will add low dose myrbetriq  and see how she does. 4/22- no significant improvement yet per pt 4/23- pt has chronic OAB 14. OSA: Uses CPAP at home-- pt using, she asked family to bring nasal unit from home as she's struggling with nasal mask hospital provided.  -4/18-4/19 Discussed asking family again to try to bring home unit- pt says will try to get nephew to bring in.   4/21 pt has home unit/nasal prongs working much better!  4/23- slept 7 hours last night! 15. Allergies  4/23- asking for  Benadryl  q6 hours scheduled- will try Claritin  daily first.  16. LLE edema  4/24- goes away with elevation- spent a long time with it hanging down yesterday- advised to elevate more and wear TEDs as she's been doing  I spent a  total of  39  minutes on total care today- >50% coordination of care- due to  D/w PA; also review of all meds and prolonged d/w pt about d/c meds- also d/w nursing about continuing the PICC.  Increased K+ and will need f/u with NSU, pain mgmt; and ID after d/c   The patient is medically ready for discharge to home and will not need follow-up with [facility] PM&R. In addition, they will need to follow up with their PCP, Neurosurgery and ID- doesn't need to see me. Also needs to see her pain mgmt doctor     LOS: 14 days A FACE TO FACE EVALUATION WAS PERFORMED  Thanh Pomerleau 02/02/2024, 8:16 AM

## 2024-02-02 NOTE — Discharge Summary (Signed)
 Physician Discharge Summary  Patient ID: AYLINE FABRIZIO MRN: [id number] DOB/AGE: 1961-09-08 63 y.o.  Admit date: 01/19/2024 Discharge date: 02/02/2024  Discharge Diagnoses:  Principal Problem:   Thoracic discitis Active Problems:   Hypokalemia   Seasonal allergies   Anemia   Abnormal gait due to muscle weakness   Postoperative infection   Thoracic myelopathy   Neurogenic bowel   Neurogenic bladder  Hypertension  Acute blood loss anemia.     Discharged Condition: stable  Significant Diagnostic Studies: DG CHEST PORT 1 VIEW Result Date: 01/13/2024 CLINICAL DATA:  Status post PICC placement. EXAM: PORTABLE CHEST 1 VIEW COMPARISON:  Chest radiograph dated 11/26/2023 FINDINGS: Right-sided PICC with tip over central SVC. Bilateral linear atelectasis. No focal consolidation, pleural effusion, pneumothorax. Stable cardiac silhouette no acute osseous pathology. Spinal hardware. IMPRESSION: Right-sided PICC with tip over central SVC. Electronically Signed   By: Angus Bark M.D.   On: 01/13/2024 16:11    Labs:  Basic Metabolic Panel:    Latest Ref Rng & Units 02/02/2024    2:12 AM 01/29/2024    5:18 AM 01/26/2024    5:04 AM  BMP  Glucose 70 - 99 mg/dL 578  469  86   BUN 8 - 23 mg/dL 17  17  16    Creatinine 0.44 - 1.00 mg/dL 6.29  5.28  4.13   Sodium 135 - 145 mmol/L 140  139  140   Potassium 3.5 - 5.1 mmol/L 3.4  4.1  4.0   Chloride 98 - 111 mmol/L 105  102  104   CO2 22 - 32 mmol/L 25  27  28    Calcium  8.9 - 10.3 mg/dL 9.3  9.2  9.1      CBC:    Latest Ref Rng & Units 02/02/2024    2:12 AM 01/29/2024    5:18 AM 01/26/2024    5:04 AM  CBC  WBC 4.0 - 10.5 K/uL 9.1  10.0  9.6   Hemoglobin 12.0 - 15.0 g/dL 24.4  01.0  27.2   Hematocrit 36.0 - 46.0 % 35.0  37.0  34.4   Platelets 150 - 400 K/uL 248  282  304      CBG: No results for input(s): "GLUCAP" in the last 168 hours.  Brief HPI:   HELI [name] is a 63 y.o. female with history of asthma, anxiety disorder, chronic  low back pain, depression, fibromyalgia, hypertension, MDS SP hardware associated lumbar wound infection treated with IV antibiotics followed by p.o. antibiotic suppression, thoracic wound drainage due to Enterobacter requiring wound debridement and repair of CSF leak this 06/23 with delayed wound healing and prolonged antibiotic course, T9 discitis/osteomyelitis treated with IV cefepime  followed by oral suppression however was reporting side effects to oral meds.  She was admitted on 01/08/2024 for laminectomy with decompression spinal stenosis T9-T10 by Dr. Michale Age with postop recommendations of IV cefepime  and Cipro  while in hospital to be followed by cefadroxil  and Cipro  for at least a year.  Patient lives alone but was independent with a cane and family and friends have been assisting since January 2025.  PT OT was working with patient was required min to mod assist with mobility and min assist with ADLs.  CIR was recommended due to functional decline.   Hospital Course: [name] was admitted to rehab 01/19/2024 for inpatient therapies to consist of PT and OT at least three hours five days a week. Past admission physiatrist, therapy team and rehab RN have worked together  to provide customized collaborative inpatient rehab.  She was maintained on IV cefepime  during his stay and has been tolerating this without difficulty.  She was transition to Cipro  and cefadroxil  at discharge.  Patient expressed concerns of side effects with oral antibiotics in the past and was advised to continue these till her follow-up with ID which has been set up for May 15.  She was maintained on OxyContin  during his stay however this was changed to MS Contin  due to issues with insurance coverage.  She has had some itching with morphine  which is managed with as needed use of Benadryl .  She was advised to use Benadryl  or Allegra as needed to help manage pruritus.    CPAP was resumed and she has been educated on importance of  its use to help with sleep hygiene as well as medical benefits.  She has been using this consistently and reports decent sleep.  She did report intolerance of melatonin which has been discontinued. Her blood pressures were monitored on TID basis and has been controlled on current dose of amlodipine  and chlorthalidone .  Hypokalemia noted at admission and has been supplemented with K-Dur 20 mEq daily.  This was decreased to 10 due to upward trend however she had recurrent drop in potassium levels therefore was advised to continue 20 mEq daily as well as increase potassium rich foods.  Renal function has been stable.  Follow-up CBC shows reactive leukocytosis has resolved and acute blood loss anemia is stable.    Iron was discontinued due to reports of IBS-C and she was advised to increase iron rich foods.  Bowel program has been augmented during her stay with use of suppositories briefly to help manage severe constipation.  She is to continue her Amitiza , MiraLAX  as well as senna and was advised to adjust this as needed as has increased needs with increase in narcotic use.  Suppository added however she has declined it.  Bladder function was monitored with PVR checks and she is voiding without difficulty.  She was started on timed voiding and low-dose Myrbetriq  added to help with chronic OAB.  Her back incision has been healing well without any signs or symptoms of infection.  Her mobility has improved and AFO was ordered to help with gait quality/foot drop.  She has made steady gains and currently requires supervision for safety in the home setting.  She will continue to receive follow-up home health PT and OT by [facility] after discharge.   Rehab course: During patient's stay in rehab weekly team conferences were held to monitor patient's progress, set goals and discuss barriers to discharge. At admission, patient required min to total assist with basic ADL task and mod assist with mobility. She   has had improvement in activity tolerance, balance, postural control as well as ability to compensate for deficits.  She is able to complete ADL tasks at modified independent level with use of AE. She is modified independent for transfers with increased time and is able to ambulate 160' with use of Rollater, cues for posture and can fluctuate from modified independent to supervision based on fatigue, distraction and neuropathy BLE.  She is able to climb 8 stair with supervision. She is able to she has multiple family member/friends who can assist as needed after discharge.     Discharge disposition: 06-Home-Health Care Svc  Diet: Regular  Special Instructions: No driving or strenuous activity till cleared by MD Antibiotics to continue for at least a year or per  ID recommendations. Refills per ID. Need to contact pain management at discharge for medication refills. (Percocet 10 mg # 40 last filled on 12/25/23)    Discharge Instructions     Ambulatory referral to Infectious Disease   Complete by: As directed    Needs follow up appointment. EOT      Allergies as of 02/02/2024       Reactions   Monosodium Glutamate Anaphylaxis, Swelling   Eyes swollen shut, facial swelling, tongue swelling.   Shellfish Allergy Anaphylaxis   Melatonin Other (See Comments)   Muscle spasms involuntary movement   Baclofen Nausea Only   Dizziness and increase muscle spasm   Celebrex  [celecoxib ] Itching   Only allergic to generic brand   Contrast Media [iodinated Contrast Media] Itching, Nausea Only   "could not walk"   Diclofenac  Itching   Generic Diclefenac gel causes itching. Can take the name brand Voltaren  gel   Gadolinium    Allergic to MRI contrast dye per patient.   Other Other (See Comments)   Pet dander,    Latex Itching, Rash   Latex glove with powder; bite blocks used for EGD studies        Medication List     STOP taking these medications    celecoxib  200 MG capsule Commonly  known as: CELEBREX    diclofenac  Sodium 1 % Gel Commonly known as: VOLTAREN    diphenhydrAMINE  25 MG tablet Commonly known as: BENADRYL    ferrous sulfate  325 (65 FE) MG EC tablet   ondansetron  4 MG disintegrating tablet Commonly known as: ZOFRAN -ODT   oxyCODONE -acetaminophen  10-325 MG tablet Commonly known as: PERCOCET       TAKE these medications    acetaminophen  325 MG tablet Commonly known as: TYLENOL  Take 1-2 tablets (325-650 mg total) by mouth every 4 (four) hours as needed for mild pain (pain score 1-3). What changed:  medication strength how much to take when to take this reasons to take this   albuterol  108 (90 Base) MCG/ACT inhaler Commonly known as: VENTOLIN  HFA Inhale 2 puffs into the lungs every 6 (six) hours as needed for wheezing or shortness of breath.   amLODipine  10 MG tablet Commonly known as: NORVASC  Take 1 tablet (10 mg total) by mouth in the morning. What changed:  medication strength how much to take when to take this   ascorbic acid  500 MG tablet Commonly known as: VITAMIN C  Take 500 mg by mouth in the morning.   aspirin  EC 81 MG tablet Take 81 mg by mouth in the morning.   atenolol  50 MG tablet Commonly known as: TENORMIN  Take 1 tablet (50 mg total) by mouth daily. What changed: when to take this   CALCIUM -MAGNESIUM -ZINC -D3 PO Take 1 tablet by mouth in the morning, at noon, and at bedtime.   cefadroxil  500 MG capsule Commonly known as: DURICEF Take 2 capsules (1,000 mg total) by mouth 2 (two) times daily. Notes to patient: Twice a day 8 am and 8 pm ( 12 hours apart)   chlorthalidone  25 MG tablet Commonly known as: HYGROTON  Take 1 tablet (25 mg total) by mouth daily. What changed: when to take this   chlorzoxazone  500 MG tablet Commonly known as: PARAFON  Take 1 tablet (500 mg total) by mouth 3 (three) times daily. What changed: when to take this   ciprofloxacin  500 MG tablet Commonly known as: Cipro  Take 1 tablet (500 mg  total) by mouth 2 (two) times daily. Notes to patient: Take it 8 am and 8  pm (12 hours apart)   cyanocobalamin  1000 MCG tablet Commonly known as: VITAMIN B12 Take 1,000 mcg by mouth once a week.   diazepam  5 MG tablet Commonly known as: VALIUM  Take 1 tablet (5 mg total) by mouth at bedtime. What changed: when to take this   gabapentin  600 MG tablet Commonly known as: NEURONTIN  Take 2 tablets (1,200 mg total) by mouth 3 (three) times daily.   hydrocerin Crea Apply 1 Application topically 2 (two) times daily.   loratadine  10 MG tablet Commonly known as: CLARITIN  Take 1 tablet (10 mg total) by mouth daily.   lubiprostone  24 MCG capsule Commonly known as: Amitiza  Take 1 capsule (24 mcg total) by mouth 2 (two) times daily with a meal. What changed:  how much to take when to take this   methocarbamol  500 MG tablet Commonly known as: ROBAXIN  Take 500 mg by mouth 4 (four) times daily.   morphine  15 MG 12 hr tablet--Rx # 14 pills. Commonly known as: MS CONTIN  Take 1 tablet (15 mg total) by mouth every 12 (twelve) hours. Notes to patient: Take 12 hours apart --8 am and 8 pm   Myrbetriq  25 MG Tb24 tablet Generic drug: mirabegron  ER Take 1 tablet (25 mg total) by mouth daily.   naloxone 4 MG/0.1ML Liqd nasal spray kit Commonly known as: Narcan Place 1 spray into the nose once as needed (opioid overdose).   omeprazole 20 MG capsule Commonly known as: PRILOSEC Take 20 mg by mouth in the morning and at bedtime.   Oxycodone  HCl 10 MG Tabs--Rx # 21 pills Take 1 tablet (10 mg total) by mouth every 6 (six) hours as needed for severe pain (pain score 7-10). Notes to patient: Limit to three pills per day as needed for severe pain. Use tylenol  in between.    polyethylene glycol powder 17 GM/SCOOP powder Commonly known as: GLYCOLAX /MIRALAX  Take 17 g by mouth daily.   potassium chloride  SA 20 MEQ tablet Commonly known as: KLOR-CON  M Take 1 tablet (20 mEq total) by mouth daily.    Senna-S 8.6-50 MG tablet Generic drug: senna-docusate Take 2 tablets by mouth daily.   VITAMIN D -3 PO Take 1 tablet by mouth in the morning.        Follow-up Information     Benedetto Brady, MD Follow up.   Specialty: Family Medicine Why: Call in 1-2 days for post hospital follow up Contact information: [address] [name] [address] [phone]         Audie Bleacher, MD Follow up.   Specialty: Neurosurgery Why: Call in 1-2 days for post hospital follow up Contact information: [address] [name] [address] [phone]         Terre Ferri, MD Follow up on 02/16/2024.   Specialty: Infectious Diseases Why: Appointment at 3:30 pm. Contact information: [address] [name] [address] [phone]         Earlis Glimpse, NP Follow up.   Specialty: Nurse Practitioner Why: Call in 1-2 days for post hospital follow up and pain medication refills Contact information: [address] [name] [address] [phone]                 Signed: Zelda Hickman 02/02/2024, 11:16 AM

## 2024-02-03 NOTE — Progress Notes (Signed)
 Inpatient Rehabilitation Care Coordinator Discharge Note   Patient Details  Name: [name] MRN: [id number] Date of Birth: October 18, 1960   Discharge location: D/c to home  Length of Stay: 13 days  Discharge activity level: Mod I/Supervision  Home/community participation: Limited  Patient response WG:[id number] Literacy - How often do you need to have someone help you when you read instructions, pamphlets, or other written material from your doctor or pharmacy?: Never  Patient response YQ:MVHQIO Isolation - How often do you feel lonely or isolated from those around you?: Never  Services provided included: MD, RD, PT, OT, RN, CM, TR, Pharmacy, Neuropsych, SW  Financial Services:  Field seismologist Utilized: Media planner [facility] Medicare Dual Complete  Choices offered to/list presented to: patient  Follow-up services arranged:  Home Health Home Health Agency: [facility] for HHPT/OT/aide         Patient response to transportation need: Is the patient able to respond to transportation needs?: Yes In the past 12 months, has lack of transportation kept you from medical appointments or from getting medications?: No In the past 12 months, has lack of transportation kept you from meetings, work, or from getting things needed for daily living?: No   Patient/Family verbalized understanding of follow-up arrangements:  Yes  Individual responsible for coordination of the follow-up plan: contact pt  Confirmed correct DME delivered: Rennis Case 02/03/2024    Comments (or additional information):  Summary of Stay    Date/Time Discharge Planning CSW  01/26/24 1509 Pt will d/c to home alone with PRN asst for IADLs, and will need otbe MOD I at d/c. [facility]. SW will confirm there are no barriers to discharge. AAC  01/20/24 0955 TBA. Per EMR, pt will d/c to home alone with PRN asst for IADLs, and will need otbe MOD I at d/c. SW will confirm there are no barriers to  discharge. AAC       Fabiha Rougeau A Brendolyn Callas

## 2024-02-16 ENCOUNTER — Inpatient Hospital Stay: Admitting: Infectious Diseases

## 2024-02-18 ENCOUNTER — Telehealth: Payer: Self-pay

## 2024-02-18 ENCOUNTER — Other Ambulatory Visit: Payer: Self-pay | Admitting: Infectious Diseases

## 2024-02-18 ENCOUNTER — Other Ambulatory Visit: Payer: Self-pay

## 2024-02-18 MED ORDER — PROMETHAZINE HCL 12.5 MG PO TABS
12.5000 mg | ORAL_TABLET | Freq: Four times a day (QID) | ORAL | 0 refills | Status: DC | PRN
Start: 2024-02-18 — End: 2024-06-22

## 2024-02-18 NOTE — Telephone Encounter (Signed)
 PT with [facility] called stating patient has been having muscle spasms and nausea since hospital discharge and starting the antibiotics. Patient has reached out to pcp and was directed to call our office due to the antibiotics she is on.  Elek Holderness Adel Holt, CMA

## 2024-02-18 NOTE — Telephone Encounter (Signed)
 Patient informed and phenergan  sent for the patient

## 2024-02-18 NOTE — Telephone Encounter (Signed)
 Patient reports already been taking Zofran  with no relief and the Zofran  makes her itch

## 2024-02-18 NOTE — Telephone Encounter (Signed)
 We can try Phenergan  instead. I think she may have had it before during previous hospitalizations in 2020 and 2021; it was at least listed as PRN med then. I think the rate of itching is actually higher with Phenergan  than Zofran , but I think it is still worth a trial for sure. Otherwise, could consider trialing scopolamine  patches. Mylinda Asa

## 2024-02-25 ENCOUNTER — Telehealth: Payer: Self-pay

## 2024-02-25 NOTE — Telephone Encounter (Signed)
 Patient called stating that phenergan  is causing her to itch. Patient stated that she doesn't think cefadroxil  and ciprofloxacin  are treating her infection properly. Patient stated that her legs are worse and she is unable to walk as good lately vs when she was discharged from the hospital.   Patient scheduled with Dr.Manandhar on 6/2. Please advise.    Lydian Chavous Roann Chestnut, CMA

## 2024-02-26 ENCOUNTER — Other Ambulatory Visit: Payer: Self-pay

## 2024-02-26 MED ORDER — CEFADROXIL 500 MG PO CAPS: 1000.0000 mg | ORAL_CAPSULE | Freq: Two times a day (BID) | ORAL | 0 refills | Status: DC

## 2024-02-26 MED ORDER — CIPROFLOXACIN HCL 500 MG PO TABS
500.0000 mg | ORAL_TABLET | Freq: Two times a day (BID) | ORAL | 0 refills | Status: DC
Start: 2024-02-26 — End: 2024-03-08

## 2024-02-26 NOTE — Telephone Encounter (Signed)
 Patient aware. 5 day supply of Cefadroxil  and Ciprofloxacin  sent to [facility] pharmacy.

## 2024-03-08 ENCOUNTER — Other Ambulatory Visit: Payer: Self-pay

## 2024-03-08 ENCOUNTER — Encounter: Payer: Self-pay | Admitting: Infectious Diseases

## 2024-03-08 ENCOUNTER — Ambulatory Visit: Admitting: Infectious Diseases

## 2024-03-08 VITALS — BP 148/81 | HR 68 | Temp 97.5°F

## 2024-03-08 DIAGNOSIS — M4624 Osteomyelitis of vertebra, thoracic region: Secondary | ICD-10-CM

## 2024-03-08 DIAGNOSIS — Z9229 Personal history of other drug therapy: Secondary | ICD-10-CM | POA: Diagnosis not present

## 2024-03-08 DIAGNOSIS — M6281 Muscle weakness (generalized): Secondary | ICD-10-CM | POA: Diagnosis not present

## 2024-03-08 DIAGNOSIS — Z79899 Other long term (current) drug therapy: Secondary | ICD-10-CM | POA: Diagnosis not present

## 2024-03-08 MED ORDER — CEFADROXIL 500 MG PO CAPS: 1000.0000 mg | ORAL_CAPSULE | Freq: Two times a day (BID) | ORAL | 1 refills | Status: DC

## 2024-03-08 MED ORDER — CIPROFLOXACIN HCL 500 MG PO TABS: 500.0000 mg | ORAL_TABLET | Freq: Two times a day (BID) | ORAL | 1 refills | Status: DC

## 2024-03-08 NOTE — Progress Notes (Signed)
 Patient Active Problem List   Diagnosis Date Noted   Neurogenic bowel 01/20/2024   Neurogenic bladder 01/20/2024   Thoracic discitis 01/19/2024   Thoracic myelopathy 01/19/2024   Thyroid  dysfunction 01/14/2024   Irritable bowel syndrome 01/14/2024   Thoracic stenosis 01/08/2024   Thoracic spinal stenosis 01/08/2024   Obesity, Class III, BMI 40-49.9 (morbid obesity) 12/30/2023   History of long term use of antibiotics due to prior spinal hardware infection MSSA and Enterobacter cloacae 12/30/2023   Osteomyelitis of thoracic region (HCC) 12/16/2023   Medication management 12/15/2023   Falls frequently 11/27/2023   Problem with vascular access--PICC line dislodgement 11/26/2023   T9 discitis/osteomyelitis and T9 epidural phlegmon/abscess s/p aspiration 10/29/2023 10/28/2023   Wound infection after surgery 05/14/2022   Delayed surgical wound healing 05/12/2022   Postoperative wound infection 04/08/2022   Scoliosis due to degenerative disease of spine in adult patient 03/30/2022   HNP (herniated nucleus pulposus), cervical 12/08/2020   Lumbago with sciatica, unspecified side 07/28/2020   PICC (peripherally inserted central catheter) in place 03/16/2020   Encounter for long-term (current) use of antibiotics 03/16/2020   Polypharmacy 03/09/2020   At risk for adverse drug event 03/09/2020   MSSA bacteremia 02/29/2020   Postoperative infection 02/28/2020   Abnormal gait due to muscle weakness 02/26/2020   HNP (herniated nucleus pulposus), lumbar 02/16/2020   Muscle weakness of lower extremity 02/14/2020   Spondylolisthesis of lumbar region 10/30/2017   Painful orthopaedic hardware right ankle 05/27/2016   Closed fracture of distal end of right fibula and tibia    Ankle syndesmosis disruption    ASCUS favor benign 01/06/2014   Leg swelling 12/22/2013   Seasonal allergies    Anemia    Chronic back pain due to DJD with history surgery    Hyperthyroidism    Bilateral DJD knees s/p  bilateral total knee replacement 03/24/2012   Status post left total prosthetic replacement of knee joint using cement 03/24/2012   Hypokalemia 01/28/2012   Fibromyalgia    GERD (gastroesophageal reflux disease)    Anxiety    Depression    Hypertension    Lumbar degenerative disc disease 09/25/2011    Patient's Medications  New Prescriptions   No medications on file  Previous Medications   ACETAMINOPHEN  (TYLENOL ) 325 MG TABLET    Take 1-2 tablets (325-650 mg total) by mouth every 4 (four) hours as needed for mild pain (pain score 1-3).   ALBUTEROL  (VENTOLIN  HFA) 108 (90 BASE) MCG/ACT INHALER    Inhale 2 puffs into the lungs every 6 (six) hours as needed for wheezing or shortness of breath.   AMLODIPINE  (NORVASC ) 10 MG TABLET    Take 1 tablet (10 mg total) by mouth in the morning.   ASCORBIC ACID  (VITAMIN C ) 500 MG TABLET    Take 500 mg by mouth in the morning.   ASPIRIN  EC 81 MG TABLET    Take 81 mg by mouth in the morning.   ATENOLOL  (TENORMIN ) 50 MG TABLET    Take 1 tablet (50 mg total) by mouth daily.   CEFADROXIL  (DURICEF) 500 MG CAPSULE    Take 2 capsules (1,000 mg total) by mouth 2 (two) times daily.   CHLORTHALIDONE  (HYGROTON ) 25 MG TABLET    Take 1 tablet (25 mg total) by mouth daily.   CHLORZOXAZONE  (PARAFON ) 500 MG TABLET    Take 1 tablet (500 mg total) by mouth 3 (three) times daily.   CHOLECALCIFEROL  (VITAMIN D -3 PO)    Take  1 tablet by mouth in the morning.   CIPROFLOXACIN  (CIPRO ) 500 MG TABLET    Take 1 tablet (500 mg total) by mouth 2 (two) times daily.   CYANOCOBALAMIN  (VITAMIN B12) 1000 MCG TABLET    Take 1,000 mcg by mouth once a week.   DIAZEPAM  (VALIUM ) 5 MG TABLET    Take 1 tablet (5 mg total) by mouth at bedtime.   GABAPENTIN  (NEURONTIN ) 600 MG TABLET    Take 2 tablets (1,200 mg total) by mouth 3 (three) times daily.   HYDROCERIN (EUCERIN) CREA    Apply 1 Application topically 2 (two) times daily.   LORATADINE  (CLARITIN ) 10 MG TABLET    Take 1 tablet (10 mg total)  by mouth daily.   LUBIPROSTONE  (AMITIZA ) 24 MCG CAPSULE    Take 1 capsule (24 mcg total) by mouth 2 (two) times daily with a meal.   METHOCARBAMOL  (ROBAXIN ) 500 MG TABLET    Take 500 mg by mouth 4 (four) times daily.   MIRABEGRON  ER (MYRBETRIQ ) 25 MG TB24 TABLET    Take 1 tablet (25 mg total) by mouth daily.   MORPHINE  (MS CONTIN ) 15 MG 12 HR TABLET    Take 1 tablet (15 mg total) by mouth every 12 (twelve) hours.   MULTIPLE MINERALS-VITAMINS (CALCIUM -MAGNESIUM -ZINC -D3 PO)    Take 1 tablet by mouth in the morning, at noon, and at bedtime.   NALOXONE  (NARCAN ) NASAL SPRAY 4 MG/0.1 ML    Place 1 spray into the nose once as needed (opioid overdose).   OMEPRAZOLE (PRILOSEC) 20 MG CAPSULE    Take 20 mg by mouth in the morning and at bedtime.    OXYCODONE  HCL 10 MG TABS    Take 1 tablet (10 mg total) by mouth every 6 (six) hours as needed for severe pain (pain score 7-10).   POLYETHYLENE GLYCOL POWDER (GLYCOLAX /MIRALAX ) 17 GM/SCOOP POWDER    Take 17 g by mouth daily.   POTASSIUM CHLORIDE  SA (KLOR-CON  M) 20 MEQ TABLET    Take 1 tablet (20 mEq total) by mouth daily.   PROMETHAZINE  (PHENERGAN ) 12.5 MG TABLET    Take 1 tablet (12.5 mg total) by mouth every 6 (six) hours as needed for nausea or vomiting.   SENNA-DOCUSATE (SENOKOT-S) 8.6-50 MG TABLET    Take 2 tablets by mouth daily.  Modified Medications   No medications on file  Discontinued Medications   No medications on file    Subjective: Discussed the use of AI scribe software for clinical note transcription with the patient, who gave verbal consent to proceed.   Per prior note from Dr Zelda Hickman 63 year old female with past medical history as below presents for hospital follow-up of postop lumbar wound.  She was admitted to [facility] 6/16 - 7/26 for arthrodesis complicated by wound infection.[name]  History of MSSA hardware associated lumbar wound infection for which she completed suppressive Keflex  in May 2021.  She was admitted for L1-L2 arthrodesis  complicated by wound drainage requiring I&D cultures growing Enterobacter cloacae.  On 6/16 she underwent T10-L 3 posterior lateral arthrodesis.  She developed profuse drainage from wound.  [facility] 2 OR on 6/24 for thoracic wound exploration and repair of CSF leak.  She fever on 6/30.  MRI on 7/6 showed fluid collection tracking along left posterior spinal cord from lower T-spine to L4.  Collection crosses midline and lumbar laminectomy space L1-L2 communicated with superficial fluid.  Taken back to the OR on 7/3 for lumbar debridement, noted pus through the length and  depth of incision.  Or cultures grew Enterobacter.  She was discharged on cefepime  x8 weeks from I&D EOT 8/27.  Patient noted that she had been antibiotics till 9/2. Plan was to place on suppressive antibiotics following completion's of IV therapy.  She does not recall her allergy to Bactrim .   Today 9/8: Patient reports feeling well.  She reports her wound VAC is out.  Denies fevers and chills.  She follows with Dr. Cabbell. 07/19/22: She thinks bactrim  maybe making her firbromylia worse, and giving her "brain fog." She reports the symptoms started 2 weeks into starting bactrim . She contnues to take it twice a day. She reports her symptoms are tolerable.   Today 01/16/23: Wound has healed.  Reports adherence to antibiotics. 12/17 : Discussed the use of AI scribe software for clinical note transcription with the patient, who gave verbal consent to proceed. 12/15/23: Pt  reports 'real bad headaches' and a rash on the legs and under the arms for the past few months. She notes she would like to stop the bactrim .    Admitted 1/21-1/25 with numbness in UE and numbness and weakness in the lower extremities. MRI 1/21 Fluid in the disc space at T9-T10, which is new compared to the 04/16/2022 exam, with some increased T2 signal in the endplates at T9-T10. These findings are concerning for discitis/osteomyelitis. No intervention per Neurosurgery. Underwent  aspiration by IR off of abtx. Cx with NG. Started on cefepime  post op and planned for 6 weeks course through 12/11/23.    Seen in the ED 2/12 for leg swelling with negative xray and doppler study.    Re-admitted 2/19-2/27 with clotted line which was subsequently exchanged. MRI 2/19 with some improved findings.    3/10: IV abtx was extended through 3/10, however it appears IV abtx was stopped on  3/7 am per [facility] and off abtx since then. No concerns with PICC. Lower back pain is 10/10 esp when she moves. She reports she has to call Dr Malvina Searle office for fu. She doesnot want to start bactrim  back again. Some nausea with cefepime  but no vomiting, rashes or diarrhea. She reports she was unable to walk since last admission sine January admission but able to walk with cane and walker prior to that,    Admitted 3/25-3/26 for PICC line dislodgement which was replaced and discharged.   Admitted on 4/3 for T9-T 10 laminectomy and decompression followed by discharge to IP rehabilitation. She was initially treated with IV cefepime  then transition to PO cefadroxil  and ciprofloxacin  on 4/8 for continuation due to concerns for hardware involvement and plan to continue  at least a year post op.   6/2 Accompanied by grandson. Taking PO cefadroxil  and ciprofloxacin . Reports worsening leg pain and inability to walk following change of IV cefepime  to current PO antibiotics - cefadroxil  and ciprofloxacin . She feels the pills are less effective and her condition has regressed, and she is now unable to walk. She experiences significant pain in her legs and back due to fibromyalgia, described as muscle squeezing, which has worsened and led to an inability to bear weight. She reports she was able to walk some with the help of rollator on Mother's day but now struggles to stand without assistance.  She reports a torn ligament in her right ankle from a fall in 2019, with subsequent surgery. She recently dislocated it again and  managed to reposition it herself. X-rays confirmed torn ligaments, and she was fitted with a supportive boot. She has an  upcoming fu with Dr Francisca Irvine for this.   She reports some nausea, occasional loose stools with antibiotics. She discontinued antiemetics due to itching.  She now takes antibiotics with Atkins protein shakes or food, but itching persists.  Review of Systems: all systems reviewed with pertinent positives and negatives as listed above   Past Medical History:  Diagnosis Date   Anemia    Ankle syndesmosis disruption    Anxiety    takes Ativan  and Valium , after mother passed   Arthritis    bilateral knees s/p knee replacement bilaterally   Asthma    Bronchitis    Bruising    pt states unexplained d/t fibromyalgia   Chronic back pain    2012 tailbone surgery and 3 lower discs.    Closed fracture of distal end of right fibula and tibia    Depression    from Fibromyalgia diagnosis; not taking medicine. since 2001   Dizziness    rarely   Fibromyalgia    diagnosed 2001   GERD (gastroesophageal reflux disease)    Prilosec occasionally   Headache(784.0)    "sinus headaches"   History of hiatal hernia    Hypertension    since 2013   Hyperthyroidism    subclinical, no treatment; thyroid  nodules   IBS (irritable bowel syndrome)    Impaired memory    states from fibromyalgia   Insomnia    takes Ambien    Jones fracture    left foot fifth metatarsal   Multiple allergies    including latex, pet dander, shellfish, pet dander   Painful orthopaedic hardware right ankle 05/27/2016   Seasonal allergies    Shortness of breath    Occasional with exertion;    Sore gums    this is why pt is on Amoxil-only takes for dental work   Tachycardia    Thyroid  goiter    Varicose vein    protrudes above skin-per pt;vein popped and bruised;ultrasound done to make sure that there were no clots;noclots were found   Past Surgical History:  Procedure Laterality Date   ANTERIOR  CERVICAL DECOMP/DISCECTOMY FUSION N/A 12/08/2020   Procedure: Cervical Four-Five Anterior cervical decompression/discectomy/fusion;  Surgeon: Audie Bleacher, MD;  Location: [facility] OR;  Service: Neurosurgery;  Laterality: N/A;  anterior   ANTERIOR LUMBAR FUSION  09/20/2011   Procedure: ANTERIOR LUMBAR FUSION 1 LEVEL;  Surgeon: Pasty Bongo;  Location: [facility];  Service: Neurosurgery;  Laterality: N/A;  Lumbar five-Sacral One Anterior Lumbar Interbody Fusion /Dr. Early to Approach    APPLICATION OF WOUND VAC N/A 05/14/2022   Procedure: Removal of wound vac with closure of wound;  Surgeon: Audie Bleacher, MD;  Location: [facility] OR;  Service: Neurosurgery;  Laterality: N/A;  Pt to be admitted on 05-12-2022   CERVICAL DISC SURGERY     WITH TITANIUM PLATE IN NECK---LEFT SIDE   CERVICAL SPINE SURGERY  03/24/2019   CESAREAN SECTION     EYE SURGERY Bilateral 2023   cataract   fibroidectomy     FRACTURE SURGERY  05/08/2010   Jones fracture left foot fifth metatarsal   HARDWARE REMOVAL Right 05/28/2016   Procedure: RIGHT ANKLE HARDWARE REMOVAL;  Surgeon: Elly Habermann, MD;  Location: [facility];  Service: Orthopedics;  Laterality: Right;   HARDWARE REMOVAL N/A 07/28/2020   Procedure: Removal of Lumbar Hardware;  Surgeon: Audie Bleacher, MD;  Location: [facility] OR;  Service: Neurosurgery;  Laterality: N/A;   HERNIA REPAIR     hiatal hernia   IR  THORACIC DISC ASPIRATION W/IMG GUIDE  10/29/2023   IRRIGATION AND DEBRIDEMENT KNEE  04/09/2012   Procedure: IRRIGATION AND DEBRIDEMENT KNEE;  Surgeon: Forbes Ida., MD;  Location: [facility] OR;  Service: Orthopedics;  Laterality: Right;   JOINT REPLACEMENT  01/27/2012   left total knee and Right total knee   KNEE SURGERY  2005   Left knee arthroscopy   LAMINECTOMY WITH POSTERIOR LATERAL ARTHRODESIS LEVEL 4 Bilateral 03/22/2022   Procedure: Thoracic ten to Lumbar three Posterior lateral arthrodesis with screws;  Surgeon: Audie Bleacher, MD;  Location:  [facility] OR;   Service: Neurosurgery;  Laterality: Bilateral;   LUMBAR WOUND DEBRIDEMENT N/A 04/08/2022   Procedure: LUMBAR WOUND DEBRIDEMENT;  Surgeon: Audie Bleacher, MD;  Location: [facility] OR;  Service: Neurosurgery;  Laterality: N/A;   NASAL SEPTOPLASTY W/ TURBINOPLASTY  2007   due to recurrent sinusitis   ORIF ANKLE FRACTURE Right 06/21/2015   Procedure: OPEN REDUCTION INTERNAL FIXATION RIGHT DISTAL FIBULA  FRACTURE AND OPEN REDUCTION INTERNAL FIXATION SYNDESMOSIS ;  Surgeon: Elly Habermann, MD;  Location: [facility];  Service: Orthopedics;  Laterality: Right;   PARTIAL HYSTERECTOMY     SHOULDER ARTHROSCOPY W/ ROTATOR CUFF REPAIR Right    SHOULDER SURGERY Left    SPINE SURGERY  2004   Cervical plate, ACDF   STERIOD INJECTION  01/27/2012   Procedure: STEROID INJECTION;  Surgeon: Genevie Kerns, MD;  Location: [facility] OR;  Service: Orthopedics;  Laterality: Right;   TEE WITHOUT CARDIOVERSION N/A 03/01/2020   Procedure: TRANSESOPHAGEAL ECHOCARDIOGRAM (TEE);  Surgeon: Hugh Madura, MD;  Location: [facility];  Service: Cardiovascular;  Laterality: N/A;   THORACIC DISCECTOMY  02/16/2020   Procedure: Thoracic ten-eleven Discectomy;  Surgeon: Audie Bleacher, MD;  Location: [facility] OR;  Service: Neurosurgery;;   THORACIC DISCECTOMY N/A 01/08/2024   Procedure: THORACIC NINE-TEN LAMINECTOMY AND DECOMPRESSION;  Surgeon: Audie Bleacher, MD;  Location: [facility] OR;  Service: Neurosurgery;  Laterality: N/A;   TONSILLECTOMY  2007   TOTAL KNEE ARTHROPLASTY  01/27/2012   Procedure: TOTAL KNEE ARTHROPLASTY;  Surgeon: Genevie Kerns, MD;  Bilateral   TOTAL KNEE ARTHROPLASTY  04/06/2012   Procedure: TOTAL KNEE ARTHROPLASTY;  Surgeon: Genevie Kerns, MD;  Location: [facility] OR;  Service: Orthopedics;  Laterality: Right;   TUBAL LIGATION     UTERINE FIBROID SURGERY     mid 200s   WOUND EXPLORATION N/A 02/27/2020   Procedure: WOUND EXPLORATION;  Surgeon: Audie Bleacher, MD;  Location: [facility] OR;  Service: Neurosurgery;  Laterality: N/A;,   (wound vac upper back)   WOUND EXPLORATION N/A 03/30/2022   Procedure: THORACIC WOUND EXPLORATION WITH REPAIR OF CSF LEAK;  Surgeon: Audie Bleacher, MD;  Location: [facility] OR;  Service: Neurosurgery;  Laterality: N/A;     Social History   Tobacco Use   Smoking status: Former    Current packs/day: 0.00    Average packs/day: 0.5 packs/day for 10.0 years (5.0 ttl pk-yrs)    Types: Cigarettes    Start date: 12/05/1988    Quit date: 12/06/1998    Years since quitting: 25.2   Smokeless tobacco: Never  Vaping Use   Vaping status: Never Used  Substance Use Topics   Alcohol  use: No   Drug use: No    Family History  Problem Relation Age of Onset   Stroke Father        passed 2004 from pneumonia   Alcohol  abuse Father    Pulmonary embolism Mother        after minor  knee surgery leading to DVT   Arthritis Sister    Depression Sister    Hypertension Sister    Diabetes Other        grandmother   Anesthesia problems Neg Hx    Hypotension Neg Hx    Malignant hyperthermia Neg Hx    Pseudochol deficiency Neg Hx     Allergies  Allergen Reactions   Monosodium Glutamate Anaphylaxis and Swelling    Eyes swollen shut, facial swelling, tongue swelling.   Shellfish Allergy Anaphylaxis   Melatonin Other (See Comments)    Muscle spasms involuntary movement   Baclofen Nausea Only    Dizziness and increase muscle spasm   Celebrex  [Celecoxib ] Itching    Only allergic to generic brand   Contrast Media [Iodinated Contrast Media] Itching and Nausea Only    "could not walk"   Diclofenac  Itching    Generic Diclefenac gel causes itching. Can take the name brand Voltaren  gel   Gadolinium     Allergic to MRI contrast dye per patient.   Other Other (See Comments)    Pet dander,        Latex Itching and Rash    Latex glove with powder; bite blocks used for EGD studies    Health Maintenance  Topic Date Due   COVID-19 Vaccine (1) Never done   Hepatitis C Screening  Never done   Pneumococcal  Vaccine 60-10 Years old (1 of 2 - PCV) Never done   Zoster Vaccines- Shingrix (1 of 2) Never done   Colonoscopy  Never done   Cervical Cancer Screening (HPV/Pap Cotest)  12/12/2018   DTaP/Tdap/Td (2 - Tdap) 05/08/2020   INFLUENZA VACCINE  05/07/2024   Medicare Annual Wellness (AWV)  10/06/2024   MAMMOGRAM  03/03/2025   HIV Screening  Completed   HPV VACCINES  Aged Out   Meningococcal B Vaccine  Aged Out    Objective: BP (!) 148/81   Pulse 68   Temp (!) 97.5 F (36.4 C) (Temporal)   LMP 11/20/2012   SpO2 98%    Physical Exam Constitutional:      Appearance: Normal appearance.  HENT:     Head: Normocephalic and atraumatic.      Mouth: Mucous membranes are moist.  Eyes:    Conjunctiva/sclera: Conjunctivae normal.     Pupils: Pupils are equal, round, and b/l symmetrical    Cardiovascular:     Rate and Rhythm: Normal rate and regular rhythm.     Heart sounds:   Pulmonary:     Effort: Pulmonary effort is normal.     Breath sounds:  Abdominal:     General: Non distended     Palpations:   Musculoskeletal:        General: sitting in the wheelchair  Skin:    General: Skin is warm and dry.     Comments: back wound healing, no signs of infection   Neurological:     General: grossly non focal     Mental Status: awake, alert and oriented to person, place, and time.   Psychiatric:        Mood and Affect: Mood normal.   Lab Results Lab Results  Component Value Date   WBC 9.1 02/02/2024   HGB 10.6 (L) 02/02/2024   HCT 35.0 (L) 02/02/2024   MCV 83.7 02/02/2024   PLT 248 02/02/2024    Lab Results  Component Value Date   CREATININE 0.74 02/02/2024   BUN 17 02/02/2024   NA 140 02/02/2024  K 3.4 (L) 02/02/2024   CL 105 02/02/2024   CO2 25 02/02/2024    Lab Results  Component Value Date   ALT 23 01/20/2024   AST 22 01/20/2024   ALKPHOS 94 01/20/2024   BILITOT <0.2 01/20/2024    Lab Results  Component Value Date   CHOL 196 12/30/2013   HDL 70 12/30/2013    LDLCALC 106 (H) 12/30/2013   TRIG 102 12/30/2013   CHOLHDL 2.8 12/30/2013   Lab Results  Component Value Date   LABRPR NON REAC 12/30/2013   No results found for: "HIV1RNAQUANT", "HIV1RNAVL", "CD4TABS"   Assessment/Plan # Postop lumbar wound infection with retained hardware culture positive for Enterobacter cloacae - s/p 6 weeks of IV cefepime  completed on 06/08/2022> started Bactrim  for p.o. suppression which was discontinued per patient preference on 09/23/2023 due to questionable rash related to Bactrim    # History of MSSA hardware associated lumbar wound infection for which she completed suppressive Keflex  in May 2021.   # Thoracic discitis/osteomyelitis  -10/29/23 T9-T10 fine-needle disc aspiration, Cx NGTD - On cefepime , planned for 6 weeks, EOT 3/6, extended to 3/10 but apparently abtx stopped on 3/7 am, restarted on IV cefepime  through 4/8 - 4/3 T9-T10 Laminectomy and decompression,no signs of infection    Plan  - discussed that unlikely antibiotics are cause of her lower extremity pain and weakness. She has Fibromyalgia, rt ankle issues as well spinal stenosis. Discussed that no alternative antibiotics an option currently as she does not want bactrim  .  - labs today - fu in 4 weeks - Fu with PCP for management of Fibromyalgia - Fu with Nsx for lower extremity weakness   I spent 31 minutes involved in face-to-face and non-face-to-face activities for this patient on the day of the visit. Professional time spent includes the following activities: Preparing to see the patient (review of tests), Obtaining and  reviewing separately obtained history (discharge record 3/26 and 4/14), Performing a medically appropriate examination and evaluation , Ordering medications, labs,  Documenting clinical information in the EMR, Independently interpreting results (not separately reported), Communicating results to the patient/family member, Counseling and educating the patient/family member and  Care coordination (not separately reported).   Of note, portions of this note may have been created with voice recognition software. While this note has been edited for accuracy, occasional wrong-word or 'sound-a-like' substitutions may have occurred due to the inherent limitations of voice recognition software.   Melvina Stage, MD [facility] 03/08/2024, 9:20 AM

## 2024-03-09 ENCOUNTER — Telehealth: Payer: Self-pay

## 2024-03-09 LAB — BASIC METABOLIC PANEL WITH GFR
BUN: 17 mg/dL (ref 7–25)
CO2: 27 mmol/L (ref 20–32)
Calcium: 9.6 mg/dL (ref 8.6–10.4)
Chloride: 105 mmol/L (ref 98–110)
Creat: 0.73 mg/dL (ref 0.50–1.05)
Glucose, Bld: 81 mg/dL (ref 65–99)
Potassium: 4.7 mmol/L (ref 3.5–5.3)
Sodium: 144 mmol/L (ref 135–146)
eGFR: 94 mL/min/{1.73_m2} (ref >=60)

## 2024-03-09 LAB — CBC
HCT: 40.2 % (ref 35.0–45.0)
Hemoglobin: 12.2 g/dL (ref 11.7–15.5)
MCH: 24.5 pg — ABNORMAL LOW (ref 27.0–33.0)
MCHC: 30.3 g/dL — ABNORMAL LOW (ref 32.0–36.0)
MCV: 80.9 fL (ref 80.0–100.0)
MPV: 11.7 fL (ref 7.5–12.5)
Platelets: 318 10*3/uL (ref 140–400)
RBC: 4.97 10*6/uL (ref 3.80–5.10)
RDW: 14.7 % (ref 11.0–15.0)
WBC: 8.1 10*3/uL (ref 3.8–10.8)

## 2024-03-09 LAB — C-REACTIVE PROTEIN: CRP: 14.4 mg/L — ABNORMAL HIGH (ref <8.0)

## 2024-03-09 NOTE — Telephone Encounter (Signed)
 Patient called stating she has some concerns continuing cipro  after reviewing template received by dispensing pharmacy. Read that this antibiotic can increase nerve damage pain/ cause nerve pain. Has stopped taking this since last night.  Has continued cefadroxil . Would like to speak with someone regarding alternative options.  Does not want to continue cipro  if this will cause increase nerve pain. Julien Odor, RMA

## 2024-03-11 ENCOUNTER — Encounter: Payer: Self-pay | Admitting: Infectious Diseases

## 2024-03-11 NOTE — Progress Notes (Signed)
 I spoke with [name] regarding  her concerns for neuropathy with ciprofloxacin . Discussed that it is one of the uncommon side effects of ciprofloxacin  and at times neuropathy can be permanent at times with continued use in those at risk.   Unfortunately no alternative PO antibiotic regimen as due to prior " allergy " with bactrim   She has enterobacter cloacae isolated back in 04/2022 and has been on prolonged course of ciprofloxacin  already. Given no other PO options and concerns for worsening of neuropathy, it is reasonable to stop. Continue cefadroxil  as prescribed. She is agreeable to the plan.

## 2024-03-12 ENCOUNTER — Ambulatory Visit: Payer: Self-pay | Admitting: Infectious Diseases

## 2024-03-12 NOTE — Telephone Encounter (Signed)
 Spoke with [name], relayed that labs were without significant abnormality and that CRP was mildly elevated. Discussed that this is a non-specific marker of inflammation and that significance is unclear, but that provider will monitor for now. Reminded her of follow up appointment. Patient verbalized understanding and has no further questions.   [name], BSN, RN

## 2024-04-08 ENCOUNTER — Ambulatory Visit: Admitting: Infectious Diseases

## 2024-04-08 ENCOUNTER — Encounter: Payer: Self-pay | Admitting: Infectious Diseases

## 2024-04-08 ENCOUNTER — Other Ambulatory Visit: Payer: Self-pay

## 2024-04-08 VITALS — BP 155/91 | HR 81

## 2024-04-08 DIAGNOSIS — R29898 Other symptoms and signs involving the musculoskeletal system: Secondary | ICD-10-CM

## 2024-04-08 DIAGNOSIS — M4624 Osteomyelitis of vertebra, thoracic region: Secondary | ICD-10-CM

## 2024-04-08 DIAGNOSIS — Z79899 Other long term (current) drug therapy: Secondary | ICD-10-CM | POA: Diagnosis not present

## 2024-04-08 MED ORDER — CEFADROXIL 500 MG PO CAPS: 1000.0000 mg | ORAL_CAPSULE | Freq: Two times a day (BID) | ORAL | 1 refills | Status: DC

## 2024-04-08 NOTE — Progress Notes (Signed)
 Patient Active Problem List   Diagnosis Date Noted   Neurogenic bowel 01/20/2024   Neurogenic bladder 01/20/2024   Thoracic discitis 01/19/2024   Thoracic myelopathy 01/19/2024   Thyroid  dysfunction 01/14/2024   Irritable bowel syndrome 01/14/2024   Thoracic stenosis 01/08/2024   Thoracic spinal stenosis 01/08/2024   Obesity, Class III, BMI 40-49.9 (morbid obesity) 12/30/2023   History of long term use of antibiotics due to prior spinal hardware infection MSSA and Enterobacter cloacae 12/30/2023   Osteomyelitis of thoracic region (HCC) 12/16/2023   Medication management 12/15/2023   Falls frequently 11/27/2023   Problem with vascular access--PICC line dislodgement 11/26/2023   T9 discitis/osteomyelitis and T9 epidural phlegmon/abscess s/p aspiration 10/29/2023 10/28/2023   Wound infection after surgery 05/14/2022   Delayed surgical wound healing 05/12/2022   Postoperative wound infection 04/08/2022   Scoliosis due to degenerative disease of spine in adult patient 03/30/2022   HNP (herniated nucleus pulposus), cervical 12/08/2020   Lumbago with sciatica, unspecified side 07/28/2020   PICC (peripherally inserted central catheter) in place 03/16/2020   Encounter for long-term (current) use of antibiotics 03/16/2020   Polypharmacy 03/09/2020   At risk for adverse drug event 03/09/2020   MSSA bacteremia 02/29/2020   Postoperative infection 02/28/2020   Abnormal gait due to muscle weakness 02/26/2020   HNP (herniated nucleus pulposus), lumbar 02/16/2020   Muscle weakness of lower extremity 02/14/2020   Spondylolisthesis of lumbar region 10/30/2017   Painful orthopaedic hardware right ankle 05/27/2016   Closed fracture of distal end of right fibula and tibia    Ankle syndesmosis disruption    ASCUS favor benign 01/06/2014   Leg swelling 12/22/2013   Seasonal allergies    Anemia    Chronic back pain due to DJD with history surgery    Hyperthyroidism    Bilateral DJD knees s/p  bilateral total knee replacement 03/24/2012   Status post left total prosthetic replacement of knee joint using cement 03/24/2012   Hypokalemia 01/28/2012   Fibromyalgia    GERD (gastroesophageal reflux disease)    Anxiety    Depression    Hypertension    Lumbar degenerative disc disease 09/25/2011    Patient's Medications  New Prescriptions   No medications on file  Previous Medications   ACETAMINOPHEN  (TYLENOL ) 325 MG TABLET    Take 1-2 tablets (325-650 mg total) by mouth every 4 (four) hours as needed for mild pain (pain score 1-3).   ALBUTEROL  (VENTOLIN  HFA) 108 (90 BASE) MCG/ACT INHALER    Inhale 2 puffs into the lungs every 6 (six) hours as needed for wheezing or shortness of breath.   AMLODIPINE  (NORVASC ) 10 MG TABLET    Take 1 tablet (10 mg total) by mouth in the morning.   ASCORBIC ACID  (VITAMIN C ) 500 MG TABLET    Take 500 mg by mouth in the morning.   ASPIRIN  EC 81 MG TABLET    Take 81 mg by mouth in the morning.   ATENOLOL  (TENORMIN ) 50 MG TABLET    Take 1 tablet (50 mg total) by mouth daily.   CEFADROXIL  (DURICEF) 500 MG CAPSULE    Take 2 capsules (1,000 mg total) by mouth 2 (two) times daily.   CHLORTHALIDONE  (HYGROTON ) 25 MG TABLET    Take 1 tablet (25 mg total) by mouth daily.   CHLORZOXAZONE  (PARAFON ) 500 MG TABLET    Take 1 tablet (500 mg total) by mouth 3 (three) times daily.   CHOLECALCIFEROL  (VITAMIN D -3 PO)    Take  1 tablet by mouth in the morning.   CIPROFLOXACIN  (CIPRO ) 500 MG TABLET    Take 1 tablet (500 mg total) by mouth 2 (two) times daily.   CYANOCOBALAMIN  (VITAMIN B12) 1000 MCG TABLET    Take 1,000 mcg by mouth once a week.   DIAZEPAM  (VALIUM ) 5 MG TABLET    Take 1 tablet (5 mg total) by mouth at bedtime.   GABAPENTIN  (NEURONTIN ) 600 MG TABLET    Take 2 tablets (1,200 mg total) by mouth 3 (three) times daily.   HYDROCERIN (EUCERIN) CREA    Apply 1 Application topically 2 (two) times daily.   LORATADINE  (CLARITIN ) 10 MG TABLET    Take 1 tablet (10 mg total)  by mouth daily.   LUBIPROSTONE  (AMITIZA ) 24 MCG CAPSULE    Take 1 capsule (24 mcg total) by mouth 2 (two) times daily with a meal.   METHOCARBAMOL  (ROBAXIN ) 500 MG TABLET    Take 500 mg by mouth 4 (four) times daily.   MIRABEGRON  ER (MYRBETRIQ ) 25 MG TB24 TABLET    Take 1 tablet (25 mg total) by mouth daily.   MORPHINE  (MS CONTIN ) 15 MG 12 HR TABLET    Take 1 tablet (15 mg total) by mouth every 12 (twelve) hours.   MULTIPLE MINERALS-VITAMINS (CALCIUM -MAGNESIUM -ZINC -D3 PO)    Take 1 tablet by mouth in the morning, at noon, and at bedtime.   NALOXONE  (NARCAN ) NASAL SPRAY 4 MG/0.1 ML    Place 1 spray into the nose once as needed (opioid overdose).   OMEPRAZOLE (PRILOSEC) 20 MG CAPSULE    Take 20 mg by mouth in the morning and at bedtime.    OXYCODONE  HCL 10 MG TABS    Take 1 tablet (10 mg total) by mouth every 6 (six) hours as needed for severe pain (pain score 7-10).   POLYETHYLENE GLYCOL POWDER (GLYCOLAX /MIRALAX ) 17 GM/SCOOP POWDER    Take 17 g by mouth daily.   POTASSIUM CHLORIDE  SA (KLOR-CON  M) 20 MEQ TABLET    Take 1 tablet (20 mEq total) by mouth daily.   PROMETHAZINE  (PHENERGAN ) 12.5 MG TABLET    Take 1 tablet (12.5 mg total) by mouth every 6 (six) hours as needed for nausea or vomiting.   SENNA-DOCUSATE (SENOKOT-S) 8.6-50 MG TABLET    Take 2 tablets by mouth daily.  Modified Medications   No medications on file  Discontinued Medications   No medications on file    Subjective: Discussed the use of AI scribe software for clinical note transcription with the patient, who gave verbal consent to proceed.   Per prior note from Dr Dennise 63 year old female with past medical history as below presents for hospital follow-up of postop lumbar wound.  She was admitted to [facility] 6/16 - 7/26 for arthrodesis complicated by wound infection.SABRA  History of MSSA hardware associated lumbar wound infection for which she completed suppressive Keflex  in May 2021.  She was admitted for L1-L2 arthrodesis  complicated by wound drainage requiring I&D cultures growing Enterobacter cloacae.  On 6/16 she underwent T10-L 3 posterior lateral arthrodesis.  She developed profuse drainage from wound.  Tamer 2 OR on 6/24 for thoracic wound exploration and repair of CSF leak.  She fever on 6/30.  MRI on 7/6 showed fluid collection tracking along left posterior spinal cord from lower T-spine to L4.  Collection crosses midline and lumbar laminectomy space L1-L2 communicated with superficial fluid.  Taken back to the OR on 7/3 for lumbar debridement, noted pus through the length and  depth of incision.  Or cultures grew Enterobacter.  She was discharged on cefepime  x8 weeks from I&D EOT 8/27.  Patient noted that she had been antibiotics till 9/2. Plan was to place on suppressive antibiotics following completion's of IV therapy.  She does not recall her allergy to Bactrim .   Today 9/8: Patient reports feeling well.  She reports her wound VAC is out.  Denies fevers and chills.  She follows with Dr. Cabbell. 07/19/22: She thinks bactrim  maybe making her firbromylia worse, and giving her brain fog. She reports the symptoms started 2 weeks into starting bactrim . She contnues to take it twice a day. She reports her symptoms are tolerable.   Today 01/16/23: Wound has healed.  Reports adherence to antibiotics. 12/17 : Discussed the use of AI scribe software for clinical note transcription with the patient, who gave verbal consent to proceed. 12/15/23: Pt  reports 'real bad headaches' and a rash on the legs and under the arms for the past few months. She notes she would like to stop the bactrim .    Admitted 1/21-1/25 with numbness in UE and numbness and weakness in the lower extremities. MRI 1/21 Fluid in the disc space at T9-T10, which is new compared to the 04/16/2022 exam, with some increased T2 signal in the endplates at T9-T10. These findings are concerning for discitis/osteomyelitis. No intervention per Neurosurgery. Underwent  aspiration by IR off of abtx. Cx with NG. Started on cefepime  post op and planned for 6 weeks course through 12/11/23.    Seen in the ED 2/12 for leg swelling with negative xray and doppler study.    Re-admitted 2/19-2/27 with clotted line which was subsequently exchanged. MRI 2/19 with some improved findings.    3/10: IV abtx was extended through 3/10, however it appears IV abtx was stopped on  3/7 am per [facility] and off abtx since then. No concerns with PICC. Lower back pain is 10/10 esp when she moves. She reports she has to call Dr Genette office for fu. She doesnot want to start bactrim  back again. Some nausea with cefepime  but no vomiting, rashes or diarrhea. She reports she was unable to walk since last admission sine January admission but able to walk with cane and walker prior to that,    Admitted 3/25-3/26 for PICC line dislodgement which was replaced and discharged.   Admitted on 4/3 for T9-T 10 laminectomy and decompression followed by discharge to IP rehabilitation. She was initially treated with IV cefepime  then transition to PO cefadroxil  and ciprofloxacin  on 4/8 for continuation due to concerns for hardware involvement and plan to continue  at least a year post op.   6/2 Accompanied by grandson. Taking PO cefadroxil  and ciprofloxacin . Reports worsening leg pain and inability to walk following change of IV cefepime  to current PO antibiotics - cefadroxil  and ciprofloxacin . She feels the pills are less effective and her condition has regressed, and she is now unable to walk. She experiences significant pain in her legs and back due to fibromyalgia, described as muscle squeezing, which has worsened and led to an inability to bear weight. She reports she was able to walk some with the help of rollator on Mother's day but now struggles to stand without assistance.  She reports a torn ligament in her right ankle from a fall in 2019, with subsequent surgery. She recently dislocated it again and  managed to reposition it herself. X-rays confirmed torn ligaments, and she was fitted with a supportive boot. She has an  upcoming fu with Dr Harriette for this.   She reports some nausea, occasional loose stools with antibiotics. She discontinued antiemetics due to itching.  She now takes antibiotics with Atkins protein shakes or food, but itching persists.  7/3 Accompanied by family member and son on phne. Reports stopped ciprofloxacin  after last discussion on 6/5. She is taking PO cefadroxil  as prescribed. Reports experiencing progressive lower limb weakness, now unable to walk or stand, requiring assistance for dressing and sitting up. She sleeps in her clothes to prepare for appointments. She reports experiencing diarrhea, nausea after stopping ciprofloxacin  initially ( resolved now)  and itching from a previous nausea medication. Asking if the ciprofloxacin  which she has stopped early June still in her system ( said it is already out from her body). She reports she noticed the weakness as soon as she was switched from IV cefepime  to PO ciprofloxacin  while in the hospital (d/w ID during hospitalization Dr Fleeta Rothman) and asking if there is anything to reverse it since she believes its because of the ciprofloxacin  which is causing her weakness.   Review of Systems: all systems reviewed with pertinent positives and negatives as listed above   Past Medical History:  Diagnosis Date   Anemia    Ankle syndesmosis disruption    Anxiety    takes Ativan  and Valium , after mother passed   Arthritis    bilateral knees s/p knee replacement bilaterally   Asthma    Bronchitis    Bruising    pt states unexplained d/t fibromyalgia   Chronic back pain    2012 tailbone surgery and 3 lower discs.    Closed fracture of distal end of right fibula and tibia    Depression    from Fibromyalgia diagnosis; not taking medicine. since 2001   Dizziness    rarely   Fibromyalgia    diagnosed 2001   GERD  (gastroesophageal reflux disease)    Prilosec occasionally   Headache(784.0)    sinus headaches   History of hiatal hernia    Hypertension    since 2013   Hyperthyroidism    subclinical, no treatment; thyroid  nodules   IBS (irritable bowel syndrome)    Impaired memory    states from fibromyalgia   Insomnia    takes Ambien    Jones fracture    left foot fifth metatarsal   Multiple allergies    including latex, pet dander, shellfish, pet dander   Painful orthopaedic hardware right ankle 05/27/2016   Seasonal allergies    Shortness of breath    Occasional with exertion;    Sore gums    this is why pt is on Amoxil-only takes for dental work   Tachycardia    Thyroid  goiter    Varicose vein    protrudes above skin-per pt;vein popped and bruised;ultrasound done to make sure that there were no clots;noclots were found   Past Surgical History:  Procedure Laterality Date   ANTERIOR CERVICAL DECOMP/DISCECTOMY FUSION N/A 12/08/2020   Procedure: Cervical Four-Five Anterior cervical decompression/discectomy/fusion;  Surgeon: Gillie Duncans, MD;  Location: [facility] OR;  Service: Neurosurgery;  Laterality: N/A;  anterior   ANTERIOR LUMBAR FUSION  09/20/2011   Procedure: ANTERIOR LUMBAR FUSION 1 LEVEL;  Surgeon: Duncans LITTIE Gillie;  Location: [facility] NEURO ORS;  Service: Neurosurgery;  Laterality: N/A;  Lumbar five-Sacral One Anterior Lumbar Interbody Fusion /Dr. Early to Approach    APPLICATION OF WOUND VAC N/A 05/14/2022   Procedure: Removal of wound vac with closure of wound;  Surgeon: Gillie Duncans, MD;  Location: [facility] OR;  Service: Neurosurgery;  Laterality: N/A;  Pt to be admitted on 05-12-2022   CERVICAL DISC SURGERY     WITH TITANIUM PLATE IN NECK---LEFT SIDE   CERVICAL SPINE SURGERY  03/24/2019   CESAREAN SECTION     EYE SURGERY Bilateral 2023   cataract   fibroidectomy     FRACTURE SURGERY  05/08/2010   Jones fracture left foot fifth metatarsal   HARDWARE REMOVAL Right 05/28/2016   Procedure:  RIGHT ANKLE HARDWARE REMOVAL;  Surgeon: Lamar Millman, MD;  Location: [facility];  Service: Orthopedics;  Laterality: Right;   HARDWARE REMOVAL N/A 07/28/2020   Procedure: Removal of Lumbar Hardware;  Surgeon: Gillie Duncans, MD;  Location: [facility] OR;  Service: Neurosurgery;  Laterality: N/A;   HERNIA REPAIR     hiatal hernia   IR THORACIC DISC ASPIRATION W/IMG GUIDE  10/29/2023   IRRIGATION AND DEBRIDEMENT KNEE  04/09/2012   Procedure: IRRIGATION AND DEBRIDEMENT KNEE;  Surgeon: LELON JONETTA Shari Mickey., MD;  Location: [facility] OR;  Service: Orthopedics;  Laterality: Right;   JOINT REPLACEMENT  01/27/2012   left total knee and Right total knee   KNEE SURGERY  2005   Left knee arthroscopy   LAMINECTOMY WITH POSTERIOR LATERAL ARTHRODESIS LEVEL 4 Bilateral 03/22/2022   Procedure: Thoracic ten to Lumbar three Posterior lateral arthrodesis with screws;  Surgeon: Gillie Duncans, MD;  Location: [facility] OR;  Service: Neurosurgery;  Laterality: Bilateral;   LUMBAR WOUND DEBRIDEMENT N/A 04/08/2022   Procedure: LUMBAR WOUND DEBRIDEMENT;  Surgeon: Gillie Duncans, MD;  Location: [facility] OR;  Service: Neurosurgery;  Laterality: N/A;   NASAL SEPTOPLASTY W/ TURBINOPLASTY  2007   due to recurrent sinusitis   ORIF ANKLE FRACTURE Right 06/21/2015   Procedure: OPEN REDUCTION INTERNAL FIXATION RIGHT DISTAL FIBULA  FRACTURE AND OPEN REDUCTION INTERNAL FIXATION SYNDESMOSIS ;  Surgeon: Lamar Millman, MD;  Location: [facility];  Service: Orthopedics;  Laterality: Right;   PARTIAL HYSTERECTOMY     SHOULDER ARTHROSCOPY W/ ROTATOR CUFF REPAIR Right    SHOULDER SURGERY Left    SPINE SURGERY  2004   Cervical plate, ACDF   STERIOD INJECTION  01/27/2012   Procedure: STEROID INJECTION;  Surgeon: Lamar DELENA Millman, MD;  Location: [facility] OR;  Service: Orthopedics;  Laterality: Right;   TEE WITHOUT CARDIOVERSION N/A 03/01/2020   Procedure: TRANSESOPHAGEAL ECHOCARDIOGRAM (TEE);  Surgeon: Jeffrie Oneil BROCKS, MD;  Location: [facility] ENDOSCOPY;   Service: Cardiovascular;  Laterality: N/A;   THORACIC DISCECTOMY  02/16/2020   Procedure: Thoracic ten-eleven Discectomy;  Surgeon: Gillie Duncans, MD;  Location[facility] OR;  Service: Neurosurgery;;   THORACIC DISCECTOMY N/A 01/08/2024   Procedure: THORACIC NINE-TEN LAMINECTOMY AND DECOMPRESSION;  Surgeon: Gillie Duncans, MD;  Location: [facility] OR;  Service: Neurosurgery;  Laterality: N/A;   TONSILLECTOMY  2007   TOTAL KNEE ARTHROPLASTY  01/27/2012   Procedure: TOTAL KNEE ARTHROPLASTY;  Surgeon: Lamar DELENA Millman, MD;  Bilateral   TOTAL KNEE ARTHROPLASTY  04/06/2012   Procedure: TOTAL KNEE ARTHROPLASTY;  Surgeon: Lamar DELENA Millman, MD;  Location: [facility] OR;  Service: Orthopedics;  Laterality: Right;   TUBAL LIGATION     UTERINE FIBROID SURGERY     mid 200s   WOUND EXPLORATION N/A 02/27/2020   Procedure: WOUND EXPLORATION;  Surgeon: Gillie Duncans, MD;  Location: [facility] OR;  Service: Neurosurgery;  Laterality: N/A;,  (wound vac upper back)   WOUND EXPLORATION N/A 03/30/2022   Procedure: THORACIC WOUND EXPLORATION WITH REPAIR OF CSF  LEAK;  Surgeon: Gillie Duncans, MD;  Location: [facility] OR;  Service: Neurosurgery;  Laterality: N/A;     Social History   Tobacco Use   Smoking status: Former    Current packs/day: 0.00    Average packs/day: 0.5 packs/day for 10.0 years (5.0 ttl pk-yrs)    Types: Cigarettes    Start date: 12/05/1988    Quit date: 12/06/1998    Years since quitting: 25.3   Smokeless tobacco: Never  Vaping Use   Vaping status: Never Used  Substance Use Topics   Alcohol  use: No   Drug use: No    Family History  Problem Relation Age of Onset   Stroke Father        passed 2004 from pneumonia   Alcohol  abuse Father    Pulmonary embolism Mother        after minor knee surgery leading to DVT   Arthritis Sister    Depression Sister    Hypertension Sister    Diabetes Other        grandmother   Anesthesia problems Neg Hx    Hypotension Neg Hx    Malignant hyperthermia Neg Hx    Pseudochol deficiency  Neg Hx     Allergies  Allergen Reactions   Monosodium Glutamate Anaphylaxis and Swelling    Eyes swollen shut, facial swelling, tongue swelling.   Shellfish Allergy Anaphylaxis   Melatonin Other (See Comments)    Muscle spasms involuntary movement   Baclofen Nausea Only    Dizziness and increase muscle spasm   Celebrex  [Celecoxib ] Itching    Only allergic to generic brand   Contrast Media [Iodinated Contrast Media] Itching and Nausea Only    could not walk   Diclofenac  Itching    Generic Diclefenac gel causes itching. Can take the name brand Voltaren  gel   Gadolinium     Allergic to MRI contrast dye per patient.   Other Other (See Comments)    Pet dander,        Latex Itching and Rash    Latex glove with powder; bite blocks used for EGD studies    Health Maintenance  Topic Date Due   COVID-19 Vaccine (1) Never done   Hepatitis C Screening  Never done   Pneumococcal Vaccine 90-47 Years old (1 of 2 - PCV) Never done   Zoster Vaccines- Shingrix (1 of 2) Never done   Colonoscopy  Never done   Cervical Cancer Screening (HPV/Pap Cotest)  12/12/2018   DTaP/Tdap/Td (2 - Tdap) 05/08/2020   INFLUENZA VACCINE  05/07/2024   Medicare Annual Wellness (AWV)  10/06/2024   MAMMOGRAM  03/03/2025   HIV Screening  Completed   Hepatitis B Vaccines  Aged Out   HPV VACCINES  Aged Out   Meningococcal B Vaccine  Aged Out    Objective: BP (!) 155/91   Pulse 81   LMP 11/20/2012   SpO2 95%    Physical Exam Constitutional:      Appearance: Normal appearance.  HENT:     Head: Normocephalic and atraumatic.      Mouth: Mucous membranes are moist.  Eyes:    Conjunctiva/sclera: Conjunctivae normal.     Pupils: Pupils are equal, round, and b/l symmetrical    Cardiovascular:     Rate and Rhythm: Normal rate and regular rhythm.     Heart sounds:   Pulmonary:     Effort: Pulmonary effort is normal.     Breath sounds:  Abdominal:     General: Non  distended     Palpations:    Musculoskeletal:        General: sitting in the wheelchair, weakness of lower extremity  Skin:    General: Skin is warm and dry.     Comments: back wound healeed, no signs of infection   Neurological:     General: grossly non focal     Mental Status: awake, alert and oriented to person, place, and time.   Psychiatric:        Mood and Affect: Mood normal.   Lab Results Lab Results  Component Value Date   WBC 8.1 03/08/2024   HGB 12.2 03/08/2024   HCT 40.2 03/08/2024   MCV 80.9 03/08/2024   PLT 318 03/08/2024    Lab Results  Component Value Date   CREATININE 0.73 03/08/2024   BUN 17 03/08/2024   NA 144 03/08/2024   K 4.7 03/08/2024   CL 105 03/08/2024   CO2 27 03/08/2024    Lab Results  Component Value Date   ALT 23 01/20/2024   AST 22 01/20/2024   ALKPHOS 94 01/20/2024   BILITOT <0.2 01/20/2024    Lab Results  Component Value Date   CHOL 196 12/30/2013   HDL 70 12/30/2013   LDLCALC 106 (H) 12/30/2013   TRIG 102 12/30/2013   CHOLHDL 2.8 12/30/2013   Lab Results  Component Value Date   LABRPR NON REAC 12/30/2013   No results found for: HIV1RNAQUANT, HIV1RNAVL, CD4TABS   Assessment/Plan # Postop lumbar wound infection with retained hardware culture positive for Enterobacter cloacae - s/p 6 weeks of IV cefepime  completed on 06/08/2022> started Bactrim  for p.o. suppression which was discontinued per patient preference on 09/23/2023 due to questionable rash related to Bactrim    # History of MSSA hardware associated lumbar wound infection for which she completed suppressive Keflex  in May 2021.   # Thoracic discitis/osteomyelitis complicated with lower extremity weakness -10/29/23 T9-T10 fine-needle disc aspiration, Cx NGTD - On cefepime , planned for 6 weeks, EOT 3/6, extended to 3/10 but apparently abtx stopped on 3/7 am, restarted on IV cefepime  through 4/8 - 4/3 T9-T10 Laminectomy and decompression,no signs of infection  - IV cefepime  switched to PO  cefadroxil  and ciprofloxacin  during discharge on 4/14 - Ciprofloxacin  stopped on 6/5 due to concerns for neuropathy, cefadroxil  discontinued  Plan  - Discussed again at length that unlikely the antibiotics specifically ciprofloxacin  is causing lower extremity weakness. Her weakness are most likely due to her disease process itself. Reviewed side effects of ciprofloxacin  with pharmacy as well. But per my discussion with her, she is not convinced and asking me to give something to reverse the weakness to which I said I have nothing to offer. I discussed with her option to seek a second opinion with another provider to which she was reluctant initially but agreed later after her son convinced her. [facility] place [facility] and she can fu with her doctor of her preference - labs today - continue cefadroxil  as prescribed  - Fu with PCP for management of Fibromyalgia - Fu with Nsx for lower extremity weakness   I spent 25 minutes involved in face-to-face and non-face-to-face activities for this patient on the day of the visit. Professional time spent includes the following activities: Preparing to see the patient (review of tests), Performing a medically appropriate examination and evaluation , Ordering medications, labs, referrals,  Documenting clinical information in the EMR, Independently interpreting results (not separately reported), Communicating results to the  patient/family member, Counseling and educating the patient/family member and Care coordination (not separately reported).   Of note, portions of this note may have been created with voice recognition software. While this note has been edited for accuracy, occasional wrong-word or 'sound-a-like' substitutions may have occurred due to the inherent limitations of voice recognition software.   Annalee Joseph, MD [facility] 04/08/2024, 9:35 AM

## 2024-04-09 LAB — BASIC METABOLIC PANEL WITH GFR
BUN/Creatinine Ratio: 31 (calc) — ABNORMAL HIGH (ref 6–22)
BUN: 14 mg/dL (ref 7–25)
CO2: 35 mmol/L — ABNORMAL HIGH (ref 20–32)
Calcium: 10.1 mg/dL (ref 8.6–10.4)
Chloride: 98 mmol/L (ref 98–110)
Creat: 0.45 mg/dL — ABNORMAL LOW (ref 0.50–1.05)
Glucose, Bld: 80 mg/dL (ref 65–99)
Potassium: 3.5 mmol/L (ref 3.5–5.3)
Sodium: 143 mmol/L (ref 135–146)
eGFR: 109 mL/min/1.73m2 (ref >=60)

## 2024-04-09 LAB — CBC
HCT: 43.7 % (ref 35.0–45.0)
Hemoglobin: 13.4 g/dL (ref 11.7–15.5)
MCH: 24.5 pg — ABNORMAL LOW (ref 27.0–33.0)
MCHC: 30.7 g/dL — ABNORMAL LOW (ref 32.0–36.0)
MCV: 79.9 fL — ABNORMAL LOW (ref 80.0–100.0)
MPV: 11.5 fL (ref 7.5–12.5)
Platelets: 304 Thousand/uL (ref 140–400)
RBC: 5.47 Million/uL — ABNORMAL HIGH (ref 3.80–5.10)
RDW: 14.2 % (ref 11.0–15.0)
WBC: 10.7 Thousand/uL (ref 3.8–10.8)

## 2024-04-09 LAB — C-REACTIVE PROTEIN: CRP: 10.8 mg/L — ABNORMAL HIGH (ref <8.0)

## 2024-04-12 ENCOUNTER — Ambulatory Visit: Payer: Self-pay | Admitting: Internal Medicine

## 2024-04-12 ENCOUNTER — Telehealth: Payer: Self-pay

## 2024-04-12 NOTE — Telephone Encounter (Signed)
 Patient called with questions about ID referral to [facility]. Confirmed that provider placed referral on 7/3, but that referral coordinator is out today. She does not have a preference regarding a specific location, but would like to go to wherever is closest to her.   Neiko Trivedi, BSN, RN

## 2024-04-23 ENCOUNTER — Other Ambulatory Visit: Payer: Self-pay | Admitting: Infectious Diseases

## 2024-04-23 DIAGNOSIS — M4626 Osteomyelitis of vertebra, lumbar region: Secondary | ICD-10-CM

## 2024-04-26 ENCOUNTER — Telehealth: Payer: Self-pay

## 2024-04-26 NOTE — Telephone Encounter (Signed)
 Received call from [place] with [facility] requesting culture results. Faxed Aerobic/Anerboic cultures and blood cultures from 10/2023.  F: [phone] Lorenda CHRISTELLA Code, RMA

## 2024-05-06 ENCOUNTER — Ambulatory Visit: Admitting: Internal Medicine

## 2024-05-06 NOTE — Progress Notes (Deleted)
 Patient: [name]  DOB: [birth year] MRN: [id number] PCP: Leonel Cole, MD  Referring Provider: ***  No chief complaint on file.    Patient Active Problem List   Diagnosis Date Noted   Weakness of both lower extremities 04/08/2024   Neurogenic bowel 01/20/2024   Neurogenic bladder 01/20/2024   Thoracic discitis 01/19/2024   Thoracic myelopathy 01/19/2024   Thyroid  dysfunction 01/14/2024   Irritable bowel syndrome 01/14/2024   Thoracic stenosis 01/08/2024   Thoracic spinal stenosis 01/08/2024   Obesity, Class III, BMI 40-49.9 (morbid obesity) 12/30/2023   History of long term use of antibiotics due to prior spinal hardware infection MSSA and Enterobacter cloacae 12/30/2023   Osteomyelitis of thoracic region (HCC) 12/16/2023   Medication management 12/15/2023   Falls frequently 11/27/2023   Problem with vascular access--PICC line dislodgement 11/26/2023   T9 discitis/osteomyelitis and T9 epidural phlegmon/abscess s/p aspiration 10/29/2023 10/28/2023   Wound infection after surgery 05/14/2022   Delayed surgical wound healing 05/12/2022   Postoperative wound infection 04/08/2022   Scoliosis due to degenerative disease of spine in adult patient 03/30/2022   HNP (herniated nucleus pulposus), cervical 12/08/2020   Lumbago with sciatica, unspecified side 07/28/2020   PICC (peripherally inserted central catheter) in place 03/16/2020   Encounter for long-term (current) use of antibiotics 03/16/2020   Polypharmacy 03/09/2020   At risk for adverse drug event 03/09/2020   MSSA bacteremia 02/29/2020   Postoperative infection 02/28/2020   Abnormal gait due to muscle weakness 02/26/2020   HNP (herniated nucleus pulposus), lumbar 02/16/2020   Muscle weakness of lower extremity 02/14/2020   Spondylolisthesis of lumbar region 10/30/2017   Painful orthopaedic hardware right ankle 05/27/2016   Closed fracture of distal end of right fibula and tibia    Ankle syndesmosis disruption     ASCUS favor benign 01/06/2014   Leg swelling 12/22/2013   Seasonal allergies    Anemia    Chronic back pain due to DJD with history surgery    Hyperthyroidism    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 03/24/2012   Status post left total prosthetic replacement of knee joint using cement 03/24/2012   Hypokalemia 01/28/2012   Fibromyalgia    GERD (gastroesophageal reflux disease)    Anxiety    Depression    Hypertension    Lumbar degenerative disc disease 09/25/2011     Subjective:  [name] is a 63 y.o. @GENDER @ with   ROS  Past Medical History:  Diagnosis Date   Anemia    Ankle syndesmosis disruption    Anxiety    takes Ativan  and Valium , after mother passed   Arthritis    bilateral knees s/p knee replacement bilaterally   Asthma    Bronchitis    Bruising    pt states unexplained d/t fibromyalgia   Chronic back pain    2012 tailbone surgery and 3 lower discs.    Closed fracture of distal end of right fibula and tibia    Depression    from Fibromyalgia diagnosis; not taking medicine. since 2001   Dizziness    rarely   Fibromyalgia    diagnosed 2001   GERD (gastroesophageal reflux disease)    Prilosec occasionally   Headache(784.0)    sinus headaches   History of hiatal hernia    Hypertension    since 2013   Hyperthyroidism    subclinical, no treatment; thyroid  nodules   IBS (irritable bowel syndrome)    Impaired memory  states from fibromyalgia   Insomnia    takes Ambien    Jones fracture    left foot fifth metatarsal   Multiple allergies    including latex, pet dander, shellfish, pet dander   Painful orthopaedic hardware right ankle 05/27/2016   Seasonal allergies    Shortness of breath    Occasional with exertion;    Sore gums    this is why pt is on Amoxil-only takes for dental work   Tachycardia    Thyroid  goiter    Varicose vein    protrudes above skin-per pt;vein popped and bruised;ultrasound done to make sure  that there were no clots;noclots were found    Outpatient Medications Prior to Visit  Medication Sig Dispense Refill   acetaminophen  (TYLENOL ) 325 MG tablet Take 1-2 tablets (325-650 mg total) by mouth every 4 (four) hours as needed for mild pain (pain score 1-3).     albuterol  (VENTOLIN  HFA) 108 (90 Base) MCG/ACT inhaler Inhale 2 puffs into the lungs every 6 (six) hours as needed for wheezing or shortness of breath. 18 g 0   amLODipine  (NORVASC ) 10 MG tablet Take 1 tablet (10 mg total) by mouth in the morning. (Patient not taking: Reported on 04/08/2024) 30 tablet 0   ascorbic acid  (VITAMIN C ) 500 MG tablet Take 500 mg by mouth in the morning.     aspirin  EC 81 MG tablet Take 81 mg by mouth in the morning.     atenolol  (TENORMIN ) 50 MG tablet Take 1 tablet (50 mg total) by mouth daily. 30 tablet 0   [START ON 05/08/2024] cefadroxil  (DURICEF) 500 MG capsule Take 2 capsules (1,000 mg total) by mouth 2 (two) times daily. 120 capsule 1   chlorthalidone  (HYGROTON ) 25 MG tablet Take 1 tablet (25 mg total) by mouth daily. (Patient not taking: Reported on 04/08/2024) 30 tablet 0   chlorzoxazone  (PARAFON ) 500 MG tablet Take 1 tablet (500 mg total) by mouth 3 (three) times daily. (Patient taking differently: Take 500 mg by mouth 4 (four) times daily.) 90 tablet 0   Cholecalciferol  (VITAMIN D -3 PO) Take 1 tablet by mouth in the morning.     ciprofloxacin  (CIPRO ) 500 MG tablet Take 1 tablet (500 mg total) by mouth 2 (two) times daily. (Patient not taking: Reported on 04/08/2024) 60 tablet 1   cyanocobalamin  (VITAMIN B12) 1000 MCG tablet Take 1,000 mcg by mouth once a week.     diazepam  (VALIUM ) 5 MG tablet Take 1 tablet (5 mg total) by mouth at bedtime.     gabapentin  (NEURONTIN ) 600 MG tablet Take 2 tablets (1,200 mg total) by mouth 3 (three) times daily. 180 tablet 0   hydrocerin (EUCERIN) CREA Apply 1 Application topically 2 (two) times daily.     hydrochlorothiazide  (HYDRODIURIL ) 25 MG tablet Take 12.5 mg by  mouth daily.     loratadine  (CLARITIN ) 10 MG tablet Take 1 tablet (10 mg total) by mouth daily. 30 tablet 0   lubiprostone  (AMITIZA ) 24 MCG capsule Take 1 capsule (24 mcg total) by mouth 2 (two) times daily with a meal. 60 capsule 0   methocarbamol  (ROBAXIN ) 500 MG tablet Take 500 mg by mouth 4 (four) times daily.     mirabegron  ER (MYRBETRIQ ) 25 MG TB24 tablet Take 1 tablet (25 mg total) by mouth daily. 30 tablet 0   morphine  (MS CONTIN ) 15 MG 12 hr tablet Take 1 tablet (15 mg total) by mouth every 12 (twelve) hours. (Patient not taking: Reported on 04/08/2024) 14 tablet  0   Multiple Minerals-Vitamins (CALCIUM -MAGNESIUM -ZINC -D3 PO) Take 1 tablet by mouth in the morning, at noon, and at bedtime.     naloxone  (NARCAN ) nasal spray 4 mg/0.1 mL Place 1 spray into the nose once as needed (opioid overdose). 2 each 0   omeprazole (PRILOSEC) 20 MG capsule Take 20 mg by mouth in the morning and at bedtime.      Oxycodone  HCl 10 MG TABS Take 1 tablet (10 mg total) by mouth every 6 (six) hours as needed for severe pain (pain score 7-10). 21 tablet 0   polyethylene glycol powder (GLYCOLAX /MIRALAX ) 17 GM/SCOOP powder Take 17 g by mouth daily. 476 g 0   potassium chloride  SA (KLOR-CON  M) 20 MEQ tablet Take 1 tablet (20 mEq total) by mouth daily. (Patient not taking: Reported on 04/08/2024) 30 tablet 0   promethazine  (PHENERGAN ) 12.5 MG tablet Take 1 tablet (12.5 mg total) by mouth every 6 (six) hours as needed for nausea or vomiting. (Patient not taking: Reported on 04/08/2024) 15 tablet 0   senna-docusate (SENOKOT-S) 8.6-50 MG tablet Take 2 tablets by mouth daily. 60 tablet 0   No facility-administered medications prior to visit.     Allergies  Allergen Reactions   Monosodium Glutamate Anaphylaxis and Swelling    Eyes swollen shut, facial swelling, tongue swelling.   Shellfish Allergy Anaphylaxis   Melatonin Other (See Comments)    Muscle spasms involuntary movement   Baclofen Nausea Only    Dizziness and  increase muscle spasm   Celebrex  [Celecoxib ] Itching    Only allergic to generic brand   Contrast Media [Iodinated Contrast Media] Itching and Nausea Only    could not walk   Diclofenac  Itching    Generic Diclefenac gel causes itching. Can take the name brand Voltaren  gel   Gadolinium     Allergic to MRI contrast dye per patient.   Other Other (See Comments)    Pet dander,        Sulfa  Antibiotics Other (See Comments)    Rash, migraine    Latex Itching and Rash    Latex glove with powder; bite blocks used for EGD studies    Social History   Tobacco Use   Smoking status: Former    Current packs/day: 0.00    Average packs/day: 0.5 packs/day for 10.0 years (5.0 ttl pk-yrs)    Types: Cigarettes    Start date: 12/05/1988    Quit date: 12/06/1998    Years since quitting: 25.4   Smokeless tobacco: Never  Vaping Use   Vaping status: Never Used  Substance Use Topics   Alcohol  use: No   Drug use: No    Family History  Problem Relation Age of Onset   Stroke Father        passed 2004 from pneumonia   Alcohol  abuse Father    Pulmonary embolism Mother        after minor knee surgery leading to DVT   Arthritis Sister    Depression Sister    Hypertension Sister    Diabetes Other        grandmother   Anesthesia problems Neg Hx    Hypotension Neg Hx    Malignant hyperthermia Neg Hx    Pseudochol deficiency Neg Hx     Objective:  There were no vitals filed for this visit. There is no height or weight on file to calculate BMI.  Physical Exam  Lab Results: Lab Results  Component Value Date   WBC 10.7 04/08/2024  HGB 13.4 04/08/2024   HCT 43.7 04/08/2024   MCV 79.9 (L) 04/08/2024   PLT 304 04/08/2024    Lab Results  Component Value Date   CREATININE 0.45 (L) 04/08/2024   BUN 14 04/08/2024   NA 143 04/08/2024   K 3.5 04/08/2024   CL 98 04/08/2024   CO2 35 (H) 04/08/2024    Lab Results  Component Value Date   ALT 23 01/20/2024   AST 22 01/20/2024   ALKPHOS  94 01/20/2024   BILITOT <0.2 01/20/2024     Assessment & Plan:    Loney Stank, MD [facility]   05/06/24  6:02 AM

## 2024-05-12 ENCOUNTER — Other Ambulatory Visit: Payer: Self-pay | Admitting: Infectious Diseases

## 2024-05-12 NOTE — Telephone Encounter (Signed)
 Please advise, patient was just seen by [facility] in [place].

## 2024-05-17 ENCOUNTER — Other Ambulatory Visit (HOSPITAL_COMMUNITY): Payer: Self-pay | Admitting: Infectious Diseases

## 2024-05-17 DIAGNOSIS — M4626 Osteomyelitis of vertebra, lumbar region: Secondary | ICD-10-CM

## 2024-05-25 NOTE — Progress Notes (Signed)
 Suppressive medications doxycycline and cefdinir sent to pharmacy at this time

## 2024-05-26 ENCOUNTER — Ambulatory Visit (HOSPITAL_COMMUNITY)
Admission: RE | Admit: 2024-05-26 | Discharge: 2024-05-26 | Discharge status: Home / Self Care | Source: Ambulatory Visit | Attending: Infectious Diseases | Admitting: Infectious Diseases

## 2024-05-26 ENCOUNTER — Ambulatory Visit (HOSPITAL_COMMUNITY)
Admission: RE | Admit: 2024-05-26 | Discharge: 2024-05-26 | Disposition: A | Discharge status: Home / Self Care | Source: Ambulatory Visit | Attending: Infectious Diseases | Admitting: Infectious Diseases

## 2024-05-26 DIAGNOSIS — M4626 Osteomyelitis of vertebra, lumbar region: Secondary | ICD-10-CM | POA: Diagnosis present

## 2024-05-26 NOTE — Telephone Encounter (Signed)
 PATIENT CALLED AND STATED THAT THE NEW MEDICATION KEEPS HER UP ALL NIGHT AND SHE SLEEPS ALL DAY. SHE'S HAVING A MRI DONE TODAY.  PLEASE ADVISE.

## 2024-05-26 NOTE — Telephone Encounter (Signed)
 Schedule her a follow up appointment for 9/4. We can discuss the antibiotics then along with her MRI results

## 2024-05-28 ENCOUNTER — Telehealth: Payer: Self-pay

## 2024-05-28 DIAGNOSIS — Z789 Other specified health status: Secondary | ICD-10-CM

## 2024-06-02 ENCOUNTER — Telehealth: Payer: Self-pay

## 2024-06-02 NOTE — Patient Outreach (Signed)
 Complex Care Management   Visit Note  06/02/2024  Name:  [name] MRN: [id number] DOB: [birth year]  Situation: Referral received for Complex Care Management related to Impaired Mobility  I obtained verbal consent from POA.  Visit completed with POA  on the phone  Background:   Past Medical History:  Diagnosis Date   Anemia    Ankle syndesmosis disruption    Anxiety    takes Ativan  and Valium , after mother passed   Arthritis    bilateral knees s/p knee replacement bilaterally   Asthma    Bronchitis    Bruising    pt states unexplained d/t fibromyalgia   Chronic back pain    2012 tailbone surgery and 3 lower discs.    Closed fracture of distal end of right fibula and tibia    Depression    from Fibromyalgia diagnosis; not taking medicine. since 2001   Dizziness    rarely   Fibromyalgia    diagnosed 2001   GERD (gastroesophageal reflux disease)    Prilosec occasionally   Headache(784.0)    sinus headaches   History of hiatal hernia    Hypertension    since 2013   Hyperthyroidism    subclinical, no treatment; thyroid  nodules   IBS (irritable bowel syndrome)    Impaired memory    states from fibromyalgia   Insomnia    takes Ambien    Jones fracture    left foot fifth metatarsal   Multiple allergies    including latex, pet dander, shellfish, pet dander   Painful orthopaedic hardware right ankle 05/27/2016   Seasonal allergies    Shortness of breath    Occasional with exertion;    Sore gums    this is why pt is on Amoxil-only takes for dental work   Tachycardia    Thyroid  goiter    Varicose vein    protrudes above skin-per pt;vein popped and bruised;ultrasound done to make sure that there were no clots;noclots were found    Assessment: Patient Reported Symptoms:  Cognitive        Neurological      HEENT        Cardiovascular      Respiratory      Endocrine      Gastrointestinal        Genitourinary      Integumentary       Musculoskeletal Musculoskelatal Symptoms Reviewed: Weakness, Limited mobility Additional Musculoskeletal Details: Unable to transfer on her own, needs one-person assist to transfer from bed to chair.        Psychosocial       Quality of Family Relationships: helpful, involved, supportive    06/02/2024    PHQ2-9 Depression Screening   Little interest or pleasure in doing things    Feeling down, depressed, or hopeless    PHQ-2 - Total Score    Trouble falling or staying asleep, or sleeping too much    Feeling tired or having little energy    Poor appetite or overeating     Feeling bad about yourself - or that you are a failure or have let yourself or your family down    Trouble concentrating on things, such as reading the newspaper or watching television    Moving or speaking so slowly that other people could have noticed.  Or the opposite - being so fidgety or restless that you have been moving around a lot more than usual    Thoughts that you would be better  off dead, or hurting yourself in some way    PHQ2-9 Total Score    If you checked off any problems, how difficult have these problems made it for you to do your work, take care of things at home, or get along with other people    Depression Interventions/Treatment      There were no vitals filed for this visit.  Medications Reviewed Today   Medications were not reviewed in this encounter     Recommendation:   Acute PCP follow-up : This RNCM will call PCP next business day to discuss Prior Approval/admission to SNF.  SABRA  Follow Up Plan:   Telephone follow-up 2-3 days   Santana Stamp BSN, CCM [place]  [facility] Population Health RN Care Manager Direct Dial: [phone]  Fax: [phone]

## 2024-06-03 ENCOUNTER — Telehealth: Payer: Self-pay

## 2024-06-03 NOTE — Patient Instructions (Signed)
 Visit Information  Thank you for taking time to visit with me today. Please don't hesitate to contact me if I can be of assistance to you before our next scheduled appointment.  Our next appointment is by telephone on Friday, September 5th at 11:00am.  Please call the care guide team at [phone] if you need to cancel or reschedule your appointment.   Following is a copy of your care plan:   Goals Addressed             This Visit's Progress    VBCI RN Care Plan       Problems:  Chronic Disease Management support and education needs related to Impaired Mobilit  Goal: Over the next 15 days the Patient will demonstrate Improved adherence to prescribed treatment plan for Impaired mobility as evidenced by working with PCP to be admitted to SNF for rehabilitation  Interventions:   Evaluation of current treatment plan related to Impaired Mobility, Limited access to caregiver self-management and patient's adherence to plan as established by provider. Discussed plans with patient for ongoing care management follow up and provided patient with direct contact information for care management team Provided education to patient re: calling [employer] to be enrolled with Care Coordination/Case Management.  This RNCM made conference call with son and [facility] advocate, advocate states patient does not have a Case Manager or Care Navigator with her plan Collaborated with [facility] Advocate regarding process for admitting patient to SNF; states PCP needs to submit a prior approval for SNF stay.  Spoke with patient, states she only wants to go back to [facility] ([facility]), does NOT want to go to a SNF, believes she will not progress at a SNF.  Explained to patient she can only get back into CIR if she has a hospitalization with acute needs for CIR (per phone conversation with Reche Lowers, Rehab Admission Coord.).  Patient states she is seeing Infectious Disease, Dr. Carolee, on 06/10/24 to go over MRI and plan.     Patient Self-Care Activities:  Attend all scheduled provider appointments Call provider office for new concerns or questions   Plan:  Telephone follow up appointment with care management team member scheduled for:  one week..             Please call the [place]  National Suicide Prevention Lifeline: 272-275-8025 or TTY: 605-185-6351 TTY (424) 466-7927) to talk to a trained counselor if you are experiencing a Mental Health or Behavioral Health Crisis or need someone to talk to.  Patient verbalizes understanding of instructions and care plan provided today and agrees to view in MyChart. Active MyChart status and patient understanding of how to access instructions and care plan via MyChart confirmed with patient.     Santana Stamp BSN, CCM [place]  [facility] Population Health RN Care Manager Direct Dial: [phone]  Fax: [phone]

## 2024-06-03 NOTE — Patient Outreach (Signed)
 Contacted Reche Lowers, [place] Admission Coordinator at [phone]. States patient cannot be admitted directly to [facility], needs hospital stay with acute needs.

## 2024-06-03 NOTE — Patient Outreach (Signed)
 Complex Care Management   Visit Note  06/03/2024  Name:  [name] MRN: [id number] DOB: Dec 16, 1960  Situation: Referral received for Complex Care Management related to Impaired Mobility  I obtained verbal consent from Patient.  Visit completed with Patient  on the phone.  Main concern today is getting back into [facility] due to worsening mobility she believes is the result of taking Cipro .  She is seeing Dr. Carolee, [facility] Infect[facility], MRI completed 05/26/24, next appointment is 9/4 to discuss MRI and plan.  She does not want to go into SNF at this time.   Background:   Past Medical History:  Diagnosis Date   Anemia    Ankle syndesmosis disruption    Anxiety    takes Ativan  and Valium , after mother passed   Arthritis    bilateral knees s/p knee replacement bilaterally   Asthma    Bronchitis    Bruising    pt states unexplained d/t fibromyalgia   Chronic back pain    2012 tailbone surgery and 3 lower discs.    Closed fracture of distal end of right fibula and tibia    Depression    from Fibromyalgia diagnosis; not taking medicine. since 2001   Dizziness    rarely   Fibromyalgia    diagnosed 2001   GERD (gastroesophageal reflux disease)    Prilosec occasionally   Headache(784.0)    sinus headaches   History of hiatal hernia    Hypertension    since 2013   Hyperthyroidism    subclinical, no treatment; thyroid  nodules   IBS (irritable bowel syndrome)    Impaired memory    states from fibromyalgia   Insomnia    takes Ambien    Jones fracture    left foot fifth metatarsal   Multiple allergies    including latex, pet dander, shellfish, pet dander   Painful orthopaedic hardware right ankle 05/27/2016   Seasonal allergies    Shortness of breath    Occasional with exertion;    Sore gums    this is why pt is on Amoxil-only takes for dental work   Tachycardia    Thyroid  goiter    Varicose vein    protrudes above skin-per pt;vein popped and  bruised;ultrasound done to make sure that there were no clots;noclots were found    Assessment: Patient Reported Symptoms:  Cognitive Cognitive Status: Alert and oriented to person, place, and time, Normal speech and language skills, Insightful and able to interpret abstract concepts      Neurological Neurological Review of Symptoms: Not assessed    HEENT HEENT Symptoms Reported: Not assessed      Cardiovascular Cardiovascular Symptoms Reported: Swelling in legs or feet Cardiovascular Comment: patient believes bilaterl LE edema is a side effect of antibiotics she is taking, doxycycline and cephalosporin.  She has informed prescribing MD, Dr. Mercer at [facility],  of the symptoms.  Respiratory Respiratory Symptoms Reported: Wheezing Other Respiratory Symptoms: Reports wheezing since starting doxycycline and cephalosporin, she has reported symptoms to Dr. Arzella .  Educated on calling EMS for symptoms of anaphylactic reaction. Respiratory Self-Management Outcome: 3 (uncertain)  Endocrine Endocrine Symptoms Reported: Not assessed    Gastrointestinal Gastrointestinal Symptoms Reported: No symptoms reported      Genitourinary Genitourinary Symptoms Reported: Not assessed    Integumentary Integumentary Symptoms Reported: Not assessed    Musculoskeletal Musculoskelatal Symptoms Reviewed: Weakness Additional Musculoskeletal Details: Unable to transfer on her own, needs one-person assist to  transfer from bed to chair. Musculoskeletal Self-Management Outcome: 2 (bad)      Psychosocial Psychosocial Symptoms Reported: Not assessed     Quality of Family Relationships: helpful, involved, supportive    06/03/2024    PHQ2-9 Depression Screening   Little interest or pleasure in doing things    Feeling down, depressed, or hopeless    PHQ-2 - Total Score    Trouble falling or staying asleep, or sleeping too much    Feeling tired or having little energy    Poor appetite  or overeating     Feeling bad about yourself - or that you are a failure or have let yourself or your family down    Trouble concentrating on things, such as reading the newspaper or watching television    Moving or speaking so slowly that other people could have noticed.  Or the opposite - being so fidgety or restless that you have been moving around a lot more than usual    Thoughts that you would be better off dead, or hurting yourself in some way    PHQ2-9 Total Score    If you checked off any problems, how difficult have these problems made it for you to do your work, take care of things at home, or get along with other people    Depression Interventions/Treatment      There were no vitals filed for this visit.  Medications Reviewed Today   Medications were not reviewed in this encounter     Recommendation:   Specialty provider follow-up : Dr. Carolee, Infectious Disease on 06/10/24 to go over MRI results, plan for Impaired Mobility.   Follow Up Plan:   Telephone follow-up in 1 week  Santana Stamp BSN, CCM [place]  [facility] Population Health RN Care Manager Direct Dial: [phone]  Fax: [phone]

## 2024-06-10 DIAGNOSIS — M797 Fibromyalgia: Secondary | ICD-10-CM | POA: Diagnosis not present

## 2024-06-10 DIAGNOSIS — T847XXA Infection and inflammatory reaction due to other internal orthopedic prosthetic devices, implants and grafts, initial encounter: Secondary | ICD-10-CM | POA: Diagnosis not present

## 2024-06-10 DIAGNOSIS — R531 Weakness: Secondary | ICD-10-CM | POA: Diagnosis not present

## 2024-06-10 DIAGNOSIS — M869 Osteomyelitis, unspecified: Secondary | ICD-10-CM | POA: Diagnosis not present

## 2024-06-11 ENCOUNTER — Other Ambulatory Visit: Payer: Self-pay

## 2024-06-11 NOTE — Patient Instructions (Addendum)
 Visit Information  Thank you for taking time to visit with me today. Please don't hesitate to contact me if I can be of assistance to you.    There are no further appointments scheduled with Santana, RNCM.  It has been established you have a Care Navigator with your [employer] Dual Complete health insurance plan.  Please contact your [facility] Dual Complete navigator for future assistance.     Please call the care guide team at [phone] if you need to cancel, schedule, or reschedule an appointment if you have any further questions.   A reminder to ALL patients/family/friends, please call the [place]  National Suicide Prevention Lifeline: 256-137-2788 or TTY: 512-277-6313 TTY 269-302-4503) to talk to a trained counselor if you are experiencing a Mental Health or Behavioral Health Crisis or need someone to talk to.  Santana Stamp BSN, CCM [place]  [facility] Population Health RN Care Manager Direct Dial: [phone]  Fax: [phone]

## 2024-06-11 NOTE — Patient Outreach (Signed)
 Complex Care Management   Visit Note  06/11/2024  Name:  [name] MRN: [id number] DOB: March 13, 1961  Situation: Referral received for Complex Care Management related to patient's son concerned about inability to care for herself.  I obtained verbal consent from Patient.  Visit completed with [name]  on the phone.  Patient is following up with [name], [facility] for plan to address impaired mobility, inability to ambulate, inability to transfer on her own.  MRI of spine performed so [name] can discuss at 9/8 appt.  [name] has been in contact with navigator at [facility] Dual Complete insurance, and has contact information.  She will learn of [name] plan and will move forward from there whether she will need more surgery, more HHPT, or to be placed in SNF.  Patient states she only wants to go to [facility]) if she is given orders to go to a SNF.    Background:   Past Medical History:  Diagnosis Date   Anemia    Ankle syndesmosis disruption    Anxiety    takes Ativan  and Valium , after mother passed   Arthritis    bilateral knees s/p knee replacement bilaterally   Asthma    Bronchitis    Bruising    pt states unexplained d/t fibromyalgia   Chronic back pain    2012 tailbone surgery and 3 lower discs.    Closed fracture of distal end of right fibula and tibia    Depression    from Fibromyalgia diagnosis; not taking medicine. since 2001   Dizziness    rarely   Fibromyalgia    diagnosed 2001   GERD (gastroesophageal reflux disease)    Prilosec occasionally   Headache(784.0)    sinus headaches   History of hiatal hernia    Hypertension    since 2013   Hyperthyroidism    subclinical, no treatment; thyroid  nodules   IBS (irritable bowel syndrome)    Impaired memory    states from fibromyalgia   Insomnia    takes Ambien    Jones fracture    left foot fifth metatarsal   Multiple allergies    including latex, pet dander,  shellfish, pet dander   Painful orthopaedic hardware right ankle 05/27/2016   Seasonal allergies    Shortness of breath    Occasional with exertion;    Sore gums    this is why pt is on Amoxil-only takes for dental work   Tachycardia    Thyroid  goiter    Varicose vein    protrudes above skin-per pt;vein popped and bruised;ultrasound done to make sure that there were no clots;noclots were found    Assessment: Patient Reported Symptoms:  Cognitive Cognitive Status: Alert and oriented to person, place, and time, Insightful and able to interpret abstract concepts, Normal speech and language skills      Neurological Neurological Review of Symptoms: No symptoms reported    HEENT HEENT Symptoms Reported: Not assessed      Cardiovascular Cardiovascular Symptoms Reported: Swelling in legs or feet Cardiovascular Comment: patient believes bilaterl LE edema is a side effect of antibiotics she is taking, doxycycline  and cephalosporin. She has informed prescribing MD, [name] at [facility] of the symptoms.  Respiratory Respiratory Symptoms Reported: Other: Other Respiratory Symptoms: Wheezing at night is improving, patient believes it was because of reaction to antibiotic, Cefnidir, she has been ordered to stop the antibiotic.    Endocrine  Endocrine Comment: Dr. Leonel has ordered U/S of thyroid  appt scheduled 07/02/24.  She has had thyroid  drained twice years ago.  Gastrointestinal Gastrointestinal Symptoms Reported: No symptoms reported Gastrointestinal Management Strategies: Medication therapy Gastrointestinal Comment: Medication for IBS-C, managed at this time.    Genitourinary Genitourinary Symptoms Reported: Frequency, Urgency Additional Genitourinary Details: Wearing adult pull-out due to frequency, urgency    Integumentary Integumentary Symptoms Reported: No symptoms reported    Musculoskeletal   Musculoskeletal Comment: Continues to have Impaired Mobility.  Patient has appt with JillCabbell to go over MRI's taken of spine.  Currently on doxycycline 100mg  twice daily for treatment.      Psychosocial Additional Psychological Details: Patient's Wylie has been there with her          06/11/2024    PHQ2-9 Depression Screening   Little interest or pleasure in doing things    Feeling down, depressed, or hopeless    PHQ-2 - Total Score    Trouble falling or staying asleep, or sleeping too much    Feeling tired or having little energy    Poor appetite or overeating     Feeling bad about yourself - or that you are a failure or have let yourself or your family down    Trouble concentrating on things, such as reading the newspaper or watching television    Moving or speaking so slowly that other people could have noticed.  Or the opposite - being so fidgety or restless that you have been moving around a lot more than usual    Thoughts that you would be better off dead, or hurting yourself in some way    PHQ2-9 Total Score    If you checked off any problems, how difficult have these problems made it for you to do your work, take care of things at home, or get along with other people    Depression Interventions/Treatment      There were no vitals filed for this visit.  Medications Reviewed Today   Medications were not reviewed in this encounter     Recommendation:   Discussed upcoming appts, patient is able to use transportation to attend: Ultrasound of Thyroid  scheduled 07/02/24 Specialty provider follow-up Dr. Cabbell9/8/25  Follow Up Plan:   Closing From:  Complex Care Management - patient has a [employer] Dual Complete Care Navigator assigned and has already spoken to her. Has contact information.   Santana Stamp BSN, CCM [place]  [facility] Population Health RN Care Manager Direct Dial: [phone]  Fax: [phone]

## 2024-06-17 ENCOUNTER — Other Ambulatory Visit: Payer: Self-pay | Admitting: Neurosurgery

## 2024-06-24 NOTE — Progress Notes (Addendum)
 Surgical Instructions   Your procedure is scheduled on Tuesday, September 23rd. Report to [facility] Main Entrance A at 5:30 A.M., then check in with the Admitting office. Any questions or running late day of surgery: call [phone]  Questions prior to your surgery date: call [phone], Monday-Friday, 8am-4pm. If you experience any cold or flu symptoms such as cough, fever, chills, shortness of breath, etc. between now and your scheduled surgery, please notify us  at the above number.     Remember:  Do not eat or drink after midnight the night before your surgery   Take these medicines the morning of surgery with A SIP OF WATER  atenolol  (TENORMIN )  CELEBREX   cyclobenzaprine  (FLEXERIL )  diazepam  (VALIUM )  diphenhydrAMINE  (BENADRYL )  gabapentin  (NEURONTIN )  omeprazole (PRILOSEC)    May take these medicines IF NEEDED: acetaminophen  (TYLENOL )  albuterol  (VENTOLIN  HFA) inhaler - bring with you on day of surgery  chlorzoxazone  (PARAFON )  methocarbamol  (ROBAXIN )  naloxone  (NARCAN )  oxyCODONE -acetaminophen  (PERCOCET)    One week prior to surgery, STOP taking any Aspirin  (unless otherwise instructed by your surgeon) Aleve, Naproxen, Ibuprofen , Motrin , Advil , Goody's, BC's, all herbal medications, fish oil, and non-prescription vitamins. This includes your Voltaren  gel.                     Do NOT Smoke (Tobacco/Vaping) for 24 hours prior to your procedure.  If you use a CPAP at night, you may bring your mask/headgear for your overnight stay.   You will be asked to remove any contacts, glasses, piercing's, hearing aid's, dentures/partials prior to surgery. Please bring cases for these items if needed.    Patients discharged the day of surgery will not be allowed to drive home, and someone needs to stay with them for 24 hours.  SURGICAL WAITING ROOM VISITATION Patients may have no more than 2 support people in the waiting area - these visitors may rotate.   Pre-op nurse  will coordinate an appropriate time for 1 ADULT support person, who may not rotate, to accompany patient in pre-op.  Children under the age of 59 must have an adult with them who is not the patient and must remain in the main waiting area with an adult.  If the patient needs to stay at the hospital during part of their recovery, the visitor guidelines for inpatient rooms apply.  Please refer to the [facility] website for the visitor guidelines for any additional information.   If you received a COVID test during your pre-op visit  it is requested that you wear a mask when out in public, stay away from anyone that may not be feeling well and notify your surgeon if you develop symptoms. If you have been in contact with anyone that has tested positive in the last 10 days please notify you surgeon.      Pre-operative 5 CHG Bathing Instructions   You can play a key role in reducing the risk of infection after surgery. Your skin needs to be as free of germs as possible. You can reduce the number of germs on your skin by washing with CHG (chlorhexidine  gluconate) soap before surgery. CHG is an antiseptic soap that kills germs and continues to kill germs even after washing.   DO NOT use if you have an allergy to chlorhexidine /CHG or antibacterial soaps. If your skin becomes reddened or irritated, stop using the CHG and notify one of our RNs at [phone].   Please shower with the CHG soap starting 4  days before surgery using the following schedule:     Please keep in mind the following:  DO NOT shave, including legs and underarms, starting the day of your first shower.   You may shave your face at any point before/day of surgery.  Place clean sheets on your bed the day you start using CHG soap. Use a clean washcloth (not used since being washed) for each shower. DO NOT sleep with pets once you start using the CHG.   CHG Shower Instructions:  Wash your face and private area with normal soap.  If you choose to wash your hair, wash first with your normal shampoo.  After you use shampoo/soap, rinse your hair and body thoroughly to remove shampoo/soap residue.  Turn the water OFF and apply about 3 tablespoons (45 ml) of CHG soap to a CLEAN washcloth.  Apply CHG soap ONLY FROM YOUR NECK DOWN TO YOUR TOES (washing for 3-5 minutes)  DO NOT use CHG soap on face, private areas, open wounds, or sores.  Pay special attention to the area where your surgery is being performed.  If you are having back surgery, having someone wash your back for you may be helpful. Wait 2 minutes after CHG soap is applied, then you may rinse off the CHG soap.  Pat dry with a clean towel  Put on clean clothes/pajamas   If you choose to wear lotion, please use ONLY the CHG-compatible lotions that are listed below.  Additional instructions for the day of surgery: DO NOT APPLY any lotions, deodorants, cologne, or perfumes.   Do not bring valuables to the hospital. [facility] is not responsible for any belongings/valuables. Do not wear nail polish, gel polish, artificial nails, or any other type of covering on natural nails (fingers and toes) Do not wear jewelry or makeup Put on clean/comfortable clothes.  Please brush your teeth.  Ask your nurse before applying any prescription medications to the skin.     CHG Compatible Lotions   Aveeno Moisturizing lotion  Cetaphil Moisturizing Cream  Cetaphil Moisturizing Lotion  Clairol Herbal Essence Moisturizing Lotion, Dry Skin  Clairol Herbal Essence Moisturizing Lotion, Extra Dry Skin  Clairol Herbal Essence Moisturizing Lotion, Normal Skin  Curel Age Defying Therapeutic Moisturizing Lotion with Alpha Hydroxy  Curel Extreme Care Body Lotion  Curel Soothing Hands Moisturizing Hand Lotion  Curel Therapeutic Moisturizing Cream, Fragrance-Free  Curel Therapeutic Moisturizing Lotion, Fragrance-Free  Curel Therapeutic Moisturizing Lotion, Original Formula  Eucerin  Daily Replenishing Lotion  Eucerin Dry Skin Therapy Plus Alpha Hydroxy Crme  Eucerin Dry Skin Therapy Plus Alpha Hydroxy Lotion  Eucerin Original Crme  Eucerin Original Lotion  Eucerin Plus Crme Eucerin Plus Lotion  Eucerin TriLipid Replenishing Lotion  Keri Anti-Bacterial Hand Lotion  Keri Deep Conditioning Original Lotion Dry Skin Formula Softly Scented  Keri Deep Conditioning Original Lotion, Fragrance Free Sensitive Skin Formula  Keri Lotion Fast Absorbing Fragrance Free Sensitive Skin Formula  Keri Lotion Fast Absorbing Softly Scented Dry Skin Formula  Keri Original Lotion  Keri Skin Renewal Lotion Keri Silky Smooth Lotion  Keri Silky Smooth Sensitive Skin Lotion  Nivea Body Creamy Conditioning Oil  Nivea Body Extra Enriched Lotion  Nivea Body Original Lotion  Nivea Body Sheer Moisturizing Lotion Nivea Crme  Nivea Skin Firming Lotion  NutraDerm 30 Skin Lotion  NutraDerm Skin Lotion  NutraDerm Therapeutic Skin Cream  NutraDerm Therapeutic Skin Lotion  ProShield Protective Hand Cream  Provon moisturizing lotion  Please read over the following fact sheets that you were  given.

## 2024-06-24 NOTE — Progress Notes (Signed)
 Dr. Gillie is out of the office and will return on Monday, September 22.  He will sign & release orders at that time.

## 2024-06-25 ENCOUNTER — Inpatient Hospital Stay (HOSPITAL_COMMUNITY)
Admission: RE | Admit: 2024-06-25 | Discharge: 2024-06-25 | Disposition: A | Discharge status: Home / Self Care | Source: Ambulatory Visit

## 2024-06-28 ENCOUNTER — Encounter (HOSPITAL_COMMUNITY): Payer: Self-pay | Admitting: Neurosurgery

## 2024-06-28 ENCOUNTER — Other Ambulatory Visit: Payer: Self-pay

## 2024-06-28 NOTE — Progress Notes (Signed)
 SDW CALL  Patient was given pre-op instructions over the phone. The opportunity was given for the patient to ask questions. No further questions asked. Patient verbalized understanding of instructions given.   PCP - Cheryle Leonel COME Cardiologist - Denies  PPM/ICD -  Device Orders -  Rep Notified -   Chest x-ray - na EKG - 10/28/23 Stress Test - >10 years ago ECHO - 03/01/20 Cardiac Cath - denies  Sleep Study -+OSA CPAP - yes  Fasting Blood Sugar - na Checks Blood Sugar _____ times a day  Blood Thinner Instructions:na Aspirin  Instructions:pt states she stopped aspirin  over a week ago because she knew she was having surgery,but she could not give me a date.   ERAS Protcol -NPO PRE-SURGERY Ensure or G2-   COVID TEST- na   Anesthesia review: no  Patient denies shortness of breath, fever, cough and chest pain over the phone call   As of today, STOP taking any Aspirin  (unless otherwise instructed by your surgeon) Aleve, Naproxen, Ibuprofen , Motrin , Advil , Goody's, BC's, all herbal medications, fish oil, and all vitamins. This includes your Celebrex .    Special instructions:    Oral Hygiene is also important to reduce your risk of infection.  Remember - BRUSH YOUR TEETH THE MORNING OF SURGERY WITH YOUR REGULAR TOOTHPASTE

## 2024-06-29 ENCOUNTER — Ambulatory Visit (HOSPITAL_COMMUNITY)

## 2024-06-29 ENCOUNTER — Inpatient Hospital Stay (HOSPITAL_COMMUNITY)
Admission: RE | Disposition: A | Discharge status: Skilled Nursing Facility | Payer: Self-pay | Source: Home / Self Care | Attending: Neurosurgery

## 2024-06-29 ENCOUNTER — Inpatient Hospital Stay (HOSPITAL_COMMUNITY)
Admission: RE | Admit: 2024-06-29 | Discharge: 2024-09-05 | DRG: 519 | Disposition: A | Discharge status: Skilled Nursing Facility | Attending: Neurosurgery | Admitting: Neurosurgery

## 2024-06-29 ENCOUNTER — Ambulatory Visit (HOSPITAL_COMMUNITY): Admitting: Registered Nurse

## 2024-06-29 ENCOUNTER — Encounter (HOSPITAL_COMMUNITY): Payer: Self-pay | Admitting: Neurosurgery

## 2024-06-29 ENCOUNTER — Other Ambulatory Visit: Payer: Self-pay

## 2024-06-29 DIAGNOSIS — R0989 Other specified symptoms and signs involving the circulatory and respiratory systems: Secondary | ICD-10-CM | POA: Diagnosis present

## 2024-06-29 DIAGNOSIS — G4700 Insomnia, unspecified: Secondary | ICD-10-CM | POA: Diagnosis present

## 2024-06-29 DIAGNOSIS — M4804 Spinal stenosis, thoracic region: Secondary | ICD-10-CM | POA: Diagnosis not present

## 2024-06-29 DIAGNOSIS — M1990 Unspecified osteoarthritis, unspecified site: Secondary | ICD-10-CM | POA: Diagnosis present

## 2024-06-29 DIAGNOSIS — Z87891 Personal history of nicotine dependence: Secondary | ICD-10-CM

## 2024-06-29 DIAGNOSIS — Z791 Long term (current) use of non-steroidal anti-inflammatories (NSAID): Secondary | ICD-10-CM

## 2024-06-29 DIAGNOSIS — Z79891 Long term (current) use of opiate analgesic: Secondary | ICD-10-CM

## 2024-06-29 DIAGNOSIS — I10 Essential (primary) hypertension: Secondary | ICD-10-CM | POA: Diagnosis present

## 2024-06-29 DIAGNOSIS — G629 Polyneuropathy, unspecified: Secondary | ICD-10-CM | POA: Diagnosis present

## 2024-06-29 DIAGNOSIS — G8929 Other chronic pain: Secondary | ICD-10-CM | POA: Diagnosis present

## 2024-06-29 DIAGNOSIS — M797 Fibromyalgia: Secondary | ICD-10-CM | POA: Diagnosis present

## 2024-06-29 DIAGNOSIS — Z888 Allergy status to other drugs, medicaments and biological substances status: Secondary | ICD-10-CM

## 2024-06-29 DIAGNOSIS — E66813 Obesity, class 3: Secondary | ICD-10-CM | POA: Diagnosis present

## 2024-06-29 DIAGNOSIS — Z6841 Body Mass Index (BMI) 40.0 and over, adult: Secondary | ICD-10-CM

## 2024-06-29 DIAGNOSIS — M21371 Foot drop, right foot: Secondary | ICD-10-CM | POA: Diagnosis present

## 2024-06-29 DIAGNOSIS — Z833 Family history of diabetes mellitus: Secondary | ICD-10-CM

## 2024-06-29 DIAGNOSIS — F419 Anxiety disorder, unspecified: Secondary | ICD-10-CM | POA: Diagnosis present

## 2024-06-29 DIAGNOSIS — Z79899 Other long term (current) drug therapy: Secondary | ICD-10-CM

## 2024-06-29 DIAGNOSIS — Z818 Family history of other mental and behavioral disorders: Secondary | ICD-10-CM

## 2024-06-29 DIAGNOSIS — E0500 Thyrotoxicosis with diffuse goiter without thyrotoxic crisis or storm: Secondary | ICD-10-CM | POA: Diagnosis present

## 2024-06-29 DIAGNOSIS — Z91041 Radiographic dye allergy status: Secondary | ICD-10-CM

## 2024-06-29 DIAGNOSIS — Z9102 Food additives allergy status: Secondary | ICD-10-CM

## 2024-06-29 DIAGNOSIS — Z91013 Allergy to seafood: Secondary | ICD-10-CM

## 2024-06-29 DIAGNOSIS — Z8261 Family history of arthritis: Secondary | ICD-10-CM

## 2024-06-29 DIAGNOSIS — R7989 Other specified abnormal findings of blood chemistry: Secondary | ICD-10-CM | POA: Diagnosis present

## 2024-06-29 DIAGNOSIS — Z8249 Family history of ischemic heart disease and other diseases of the circulatory system: Secondary | ICD-10-CM

## 2024-06-29 DIAGNOSIS — Z7982 Long term (current) use of aspirin: Secondary | ICD-10-CM

## 2024-06-29 DIAGNOSIS — M549 Dorsalgia, unspecified: Secondary | ICD-10-CM | POA: Diagnosis present

## 2024-06-29 DIAGNOSIS — G250 Essential tremor: Secondary | ICD-10-CM | POA: Diagnosis present

## 2024-06-29 DIAGNOSIS — F418 Other specified anxiety disorders: Secondary | ICD-10-CM

## 2024-06-29 DIAGNOSIS — Z90711 Acquired absence of uterus with remaining cervical stump: Secondary | ICD-10-CM

## 2024-06-29 DIAGNOSIS — K589 Irritable bowel syndrome without diarrhea: Secondary | ICD-10-CM | POA: Diagnosis present

## 2024-06-29 DIAGNOSIS — K219 Gastro-esophageal reflux disease without esophagitis: Secondary | ICD-10-CM | POA: Diagnosis present

## 2024-06-29 DIAGNOSIS — R531 Weakness: Secondary | ICD-10-CM | POA: Diagnosis present

## 2024-06-29 DIAGNOSIS — I8390 Asymptomatic varicose veins of unspecified lower extremity: Secondary | ICD-10-CM | POA: Diagnosis present

## 2024-06-29 DIAGNOSIS — Z0189 Encounter for other specified special examinations: Secondary | ICD-10-CM | POA: Diagnosis not present

## 2024-06-29 DIAGNOSIS — Z96653 Presence of artificial knee joint, bilateral: Secondary | ICD-10-CM | POA: Diagnosis present

## 2024-06-29 DIAGNOSIS — G4730 Sleep apnea, unspecified: Secondary | ICD-10-CM | POA: Diagnosis present

## 2024-06-29 DIAGNOSIS — Z9109 Other allergy status, other than to drugs and biological substances: Secondary | ICD-10-CM

## 2024-06-29 DIAGNOSIS — J45909 Unspecified asthma, uncomplicated: Secondary | ICD-10-CM | POA: Diagnosis present

## 2024-06-29 DIAGNOSIS — Z882 Allergy status to sulfonamides status: Secondary | ICD-10-CM

## 2024-06-29 DIAGNOSIS — Z881 Allergy status to other antibiotic agents status: Secondary | ICD-10-CM

## 2024-06-29 DIAGNOSIS — Z91040 Latex allergy status: Secondary | ICD-10-CM

## 2024-06-29 DIAGNOSIS — F32A Depression, unspecified: Secondary | ICD-10-CM | POA: Diagnosis present

## 2024-06-29 HISTORY — DX: Sleep apnea, unspecified: G47.30

## 2024-06-29 HISTORY — PX: THORACIC DISCECTOMY: SHX6113

## 2024-06-29 LAB — BASIC METABOLIC PANEL WITH GFR
Anion gap: 13 (ref 5–15)
BUN: 19 mg/dL (ref 8–23)
CO2: 29 mmol/L (ref 22–32)
Calcium: 9 mg/dL (ref 8.9–10.3)
Chloride: 98 mmol/L (ref 98–111)
Creatinine, Ser: 0.66 mg/dL (ref 0.44–1.00)
GFR, Estimated: >60 mL/min (ref >60)
Glucose, Bld: 92 mg/dL (ref 70–99)
Potassium: 3.7 mmol/L (ref 3.5–5.1)
Sodium: 140 mmol/L (ref 135–145)

## 2024-06-29 LAB — CBC
HCT: 40.7 % (ref 36.0–46.0)
Hemoglobin: 11.9 g/dL — ABNORMAL LOW (ref 12.0–15.0)
MCH: 24.5 pg — ABNORMAL LOW (ref 26.0–34.0)
MCHC: 29.2 g/dL — ABNORMAL LOW (ref 30.0–36.0)
MCV: 83.7 fL (ref 80.0–100.0)
Platelets: 293 K/uL (ref 150–400)
RBC: 4.86 MIL/uL (ref 3.87–5.11)
RDW: 15.6 % — ABNORMAL HIGH (ref 11.5–15.5)
WBC: 12 K/uL — ABNORMAL HIGH (ref 4.0–10.5)
nRBC: 0 % (ref 0.0–0.2)

## 2024-06-29 LAB — SURGICAL PCR SCREEN
MRSA, PCR: NEGATIVE
Staphylococcus aureus: NEGATIVE

## 2024-06-29 MED ORDER — ONDANSETRON HCL 4 MG/2ML IJ SOLN
INTRAMUSCULAR | Status: DC | PRN
  Administered 2024-06-29: 4 mg via INTRAVENOUS

## 2024-06-29 MED ORDER — HYDROCHLOROTHIAZIDE 25 MG PO TABS
25.0000 mg | ORAL_TABLET | Freq: Every morning | ORAL | Status: DC
Start: 2024-06-30 — End: 2024-07-06
  Administered 2024-06-30 – 2024-07-06 (×7): 25 mg via ORAL
  Filled 2024-06-29 (×7): qty 1

## 2024-06-29 MED ORDER — VITAMIN B-12 1000 MCG PO TABS
500.0000 ug | ORAL_TABLET | Freq: Every day | ORAL | Status: DC
  Administered 2024-06-30 – 2024-09-05 (×68): 500 ug via ORAL
  Filled 2024-06-29 (×69): qty 1

## 2024-06-29 MED ORDER — OXYCODONE HCL 5 MG PO TABS
5.0000 mg | ORAL_TABLET | ORAL | Status: DC | PRN
  Administered 2024-07-12 – 2024-09-01 (×10): 5 mg via ORAL
  Filled 2024-06-29 (×14): qty 1

## 2024-06-29 MED ORDER — SENNA 8.6 MG PO TABS
1.0000 | ORAL_TABLET | Freq: Two times a day (BID) | ORAL | Status: DC
  Administered 2024-06-29 – 2024-07-27 (×38): 8.6 mg via ORAL
  Filled 2024-06-29 (×52): qty 1

## 2024-06-29 MED ORDER — LACTATED RINGERS IV SOLN
INTRAVENOUS | Status: DC
Start: 2024-06-29 — End: 2024-06-29

## 2024-06-29 MED ORDER — MORPHINE SULFATE (PF) 2 MG/ML IV SOLN
2.0000 mg | INTRAVENOUS | Status: DC | PRN
  Administered 2024-06-29 – 2024-08-22 (×9): 2 mg via INTRAVENOUS
  Filled 2024-06-29 (×11): qty 1

## 2024-06-29 MED ORDER — FENTANYL CITRATE (PF) 100 MCG/2ML IJ SOLN
INTRAMUSCULAR | Status: AC
  Filled 2024-06-29: qty 2

## 2024-06-29 MED ORDER — ONDANSETRON HCL 4 MG/2ML IJ SOLN: 4.0000 mg | Freq: Four times a day (QID) | INTRAMUSCULAR | Status: DC | PRN

## 2024-06-29 MED ORDER — PROPOFOL 10 MG/ML IV BOLUS
INTRAVENOUS | Status: AC
  Filled 2024-06-29: qty 20

## 2024-06-29 MED ORDER — CELECOXIB 200 MG PO CAPS
200.0000 mg | ORAL_CAPSULE | Freq: Two times a day (BID) | ORAL | Status: DC
  Administered 2024-06-29 – 2024-09-05 (×136): 200 mg via ORAL
  Filled 2024-06-29 (×136): qty 1

## 2024-06-29 MED ORDER — DOXYCYCLINE HYCLATE 100 MG PO TABS
100.0000 mg | ORAL_TABLET | Freq: Two times a day (BID) | ORAL | Status: DC
  Administered 2024-06-29 – 2024-09-05 (×136): 100 mg via ORAL
  Filled 2024-06-29 (×140): qty 1

## 2024-06-29 MED ORDER — CHLORHEXIDINE GLUCONATE CLOTH 2 % EX PADS: 6.0000 | MEDICATED_PAD | Freq: Once | CUTANEOUS | Status: DC

## 2024-06-29 MED ORDER — SODIUM CHLORIDE 0.9% FLUSH
3.0000 mL | Freq: Two times a day (BID) | INTRAVENOUS | Status: DC
  Administered 2024-06-30 – 2024-09-05 (×125): 3 mL via INTRAVENOUS

## 2024-06-29 MED ORDER — PHENOL 1.4 % MT LIQD
1.0000 | OROMUCOSAL | Status: DC | PRN
  Administered 2024-08-13: 1 via OROMUCOSAL
  Filled 2024-06-29: qty 177

## 2024-06-29 MED ORDER — MENTHOL 3 MG MT LOZG: 1.0000 | LOZENGE | OROMUCOSAL | Status: DC | PRN

## 2024-06-29 MED ORDER — MIDAZOLAM HCL 2 MG/2ML IJ SOLN
INTRAMUSCULAR | Status: AC
  Filled 2024-06-29: qty 2

## 2024-06-29 MED ORDER — ROCURONIUM BROMIDE 10 MG/ML (PF) SYRINGE
PREFILLED_SYRINGE | INTRAVENOUS | Status: DC | PRN
  Administered 2024-06-29: 20 mg via INTRAVENOUS
  Administered 2024-06-29: 50 mg via INTRAVENOUS
  Administered 2024-06-29: 10 mg via INTRAVENOUS

## 2024-06-29 MED ORDER — LUBIPROSTONE 24 MCG PO CAPS
48.0000 ug | ORAL_CAPSULE | Freq: Every day | ORAL | Status: DC
  Administered 2024-06-30 – 2024-09-05 (×68): 48 ug via ORAL
  Filled 2024-06-29 (×70): qty 2

## 2024-06-29 MED ORDER — PROPOFOL 10 MG/ML IV BOLUS
INTRAVENOUS | Status: DC | PRN
  Administered 2024-06-29: 100 mg via INTRAVENOUS

## 2024-06-29 MED ORDER — ORAL CARE MOUTH RINSE: 15.0000 mL | Freq: Once | OROMUCOSAL | Status: AC

## 2024-06-29 MED ORDER — GABAPENTIN 400 MG PO CAPS
1200.0000 mg | ORAL_CAPSULE | Freq: Three times a day (TID) | ORAL | Status: DC
  Administered 2024-06-29 – 2024-09-05 (×203): 1200 mg via ORAL
  Filled 2024-06-29 (×106): qty 3
  Filled 2024-06-29: qty 12
  Filled 2024-06-29 (×96): qty 3

## 2024-06-29 MED ORDER — CEFAZOLIN SODIUM-DEXTROSE 2-4 GM/100ML-% IV SOLN
2.0000 g | Freq: Three times a day (TID) | INTRAVENOUS | Status: AC
  Administered 2024-06-29 – 2024-06-30 (×2): 2 g via INTRAVENOUS
  Filled 2024-06-29 (×2): qty 100

## 2024-06-29 MED ORDER — GENTAMICIN SULFATE 40 MG/ML IJ SOLN
INTRAMUSCULAR | Status: DC | PRN
  Administered 2024-06-29: 240 mg via INTRAMUSCULAR

## 2024-06-29 MED ORDER — ATENOLOL 50 MG PO TABS
50.0000 mg | ORAL_TABLET | Freq: Every day | ORAL | Status: DC
  Administered 2024-06-30 – 2024-09-05 (×66): 50 mg via ORAL
  Filled 2024-06-29 (×4): qty 2
  Filled 2024-06-29 (×2): qty 1
  Filled 2024-06-29: qty 2
  Filled 2024-06-29: qty 1
  Filled 2024-06-29 (×3): qty 2
  Filled 2024-06-29 (×4): qty 1
  Filled 2024-06-29: qty 2
  Filled 2024-06-29 (×3): qty 1
  Filled 2024-06-29: qty 2
  Filled 2024-06-29: qty 1
  Filled 2024-06-29: qty 2
  Filled 2024-06-29: qty 1
  Filled 2024-06-29 (×3): qty 2
  Filled 2024-06-29: qty 1
  Filled 2024-06-29 (×2): qty 2
  Filled 2024-06-29: qty 1
  Filled 2024-06-29: qty 2
  Filled 2024-06-29: qty 1
  Filled 2024-06-29: qty 2
  Filled 2024-06-29 (×4): qty 1
  Filled 2024-06-29: qty 2
  Filled 2024-06-29 (×5): qty 1
  Filled 2024-06-29 (×2): qty 2
  Filled 2024-06-29: qty 1
  Filled 2024-06-29: qty 2
  Filled 2024-06-29: qty 1
  Filled 2024-06-29: qty 2
  Filled 2024-06-29 (×3): qty 1
  Filled 2024-06-29: qty 2
  Filled 2024-06-29 (×3): qty 1
  Filled 2024-06-29: qty 2
  Filled 2024-06-29: qty 1
  Filled 2024-06-29 (×4): qty 2
  Filled 2024-06-29 (×2): qty 1
  Filled 2024-06-29: qty 2
  Filled 2024-06-29 (×4): qty 1
  Filled 2024-06-29: qty 2
  Filled 2024-06-29 (×2): qty 1
  Filled 2024-06-29: qty 2
  Filled 2024-06-29: qty 1
  Filled 2024-06-29: qty 2
  Filled 2024-06-29 (×3): qty 1
  Filled 2024-06-29 (×5): qty 2
  Filled 2024-06-29: qty 1
  Filled 2024-06-29 (×6): qty 2
  Filled 2024-06-29: qty 1
  Filled 2024-06-29: qty 2
  Filled 2024-06-29: qty 1
  Filled 2024-06-29 (×2): qty 2
  Filled 2024-06-29: qty 1
  Filled 2024-06-29 (×2): qty 2
  Filled 2024-06-29: qty 1
  Filled 2024-06-29: qty 2
  Filled 2024-06-29: qty 1
  Filled 2024-06-29: qty 2
  Filled 2024-06-29: qty 1
  Filled 2024-06-29 (×2): qty 2
  Filled 2024-06-29: qty 1
  Filled 2024-06-29 (×2): qty 2
  Filled 2024-06-29: qty 1
  Filled 2024-06-29: qty 2
  Filled 2024-06-29: qty 1
  Filled 2024-06-29 (×6): qty 2
  Filled 2024-06-29: qty 1
  Filled 2024-06-29: qty 2
  Filled 2024-06-29 (×3): qty 1
  Filled 2024-06-29 (×3): qty 2
  Filled 2024-06-29 (×3): qty 1
  Filled 2024-06-29: qty 2
  Filled 2024-06-29 (×4): qty 1
  Filled 2024-06-29: qty 2
  Filled 2024-06-29 (×2): qty 1

## 2024-06-29 MED ORDER — DIAZEPAM 5 MG PO TABS
5.0000 mg | ORAL_TABLET | Freq: Two times a day (BID) | ORAL | Status: DC | PRN
  Administered 2024-06-30 – 2024-09-05 (×84): 5 mg via ORAL
  Filled 2024-06-29 (×85): qty 1

## 2024-06-29 MED ORDER — NALOXONE HCL 4 MG/0.1ML NA LIQD: 1.0000 | Freq: Once | NASAL | Status: DC | PRN

## 2024-06-29 MED ORDER — POTASSIUM CHLORIDE IN NACL 20-0.9 MEQ/L-% IV SOLN
INTRAVENOUS | Status: DC
  Filled 2024-06-29 (×5): qty 1000

## 2024-06-29 MED ORDER — OXYCODONE HCL 5 MG PO TABS
10.0000 mg | ORAL_TABLET | ORAL | Status: DC | PRN
  Administered 2024-06-29 – 2024-08-25 (×151): 10 mg via ORAL
  Administered 2024-08-25: 5 mg via ORAL
  Administered 2024-08-26 – 2024-09-05 (×34): 10 mg via ORAL
  Filled 2024-06-29 (×185): qty 2

## 2024-06-29 MED ORDER — PANTOPRAZOLE SODIUM 40 MG PO TBEC
40.0000 mg | DELAYED_RELEASE_TABLET | Freq: Every day | ORAL | Status: DC
  Administered 2024-06-30 – 2024-09-05 (×68): 40 mg via ORAL
  Filled 2024-06-29 (×17): qty 1
  Filled 2024-06-29: qty 2
  Filled 2024-06-29 (×30): qty 1
  Filled 2024-06-29: qty 2
  Filled 2024-06-29 (×19): qty 1

## 2024-06-29 MED ORDER — CHLORZOXAZONE 500 MG PO TABS
500.0000 mg | ORAL_TABLET | Freq: Three times a day (TID) | ORAL | Status: DC
  Administered 2024-06-29 – 2024-09-05 (×202): 500 mg via ORAL
  Filled 2024-06-29 (×208): qty 1

## 2024-06-29 MED ORDER — SODIUM CHLORIDE 0.9 % IV SOLN: 250.0000 mL | INTRAVENOUS | Status: AC

## 2024-06-29 MED ORDER — LIDOCAINE 2% (20 MG/ML) 5 ML SYRINGE
INTRAMUSCULAR | Status: DC | PRN
  Administered 2024-06-29: 60 mg via INTRAVENOUS

## 2024-06-29 MED ORDER — THROMBIN 5000 UNITS EX KIT
PACK | CUTANEOUS | Status: AC
  Filled 2024-06-29: qty 2

## 2024-06-29 MED ORDER — SODIUM CHLORIDE 0.9% FLUSH: 3.0000 mL | INTRAVENOUS | Status: DC | PRN

## 2024-06-29 MED ORDER — LIDOCAINE-EPINEPHRINE 0.5 %-1:200000 IJ SOLN
INTRAMUSCULAR | Status: AC
  Filled 2024-06-29: qty 50

## 2024-06-29 MED ORDER — FENTANYL CITRATE (PF) 100 MCG/2ML IJ SOLN
25.0000 ug | INTRAMUSCULAR | Status: DC | PRN
  Administered 2024-06-29: 25 ug via INTRAVENOUS

## 2024-06-29 MED ORDER — MAGNESIUM CITRATE PO SOLN: 1.0000 | Freq: Once | ORAL | Status: DC | PRN

## 2024-06-29 MED ORDER — OXYCODONE HCL ER 15 MG PO T12A
15.0000 mg | EXTENDED_RELEASE_TABLET | Freq: Two times a day (BID) | ORAL | Status: DC
  Administered 2024-06-29 – 2024-09-05 (×136): 15 mg via ORAL
  Filled 2024-06-29 (×137): qty 1

## 2024-06-29 MED ORDER — GENTAMICIN SULFATE 40 MG/ML IJ SOLN
INTRAMUSCULAR | Status: AC
  Filled 2024-06-29: qty 4

## 2024-06-29 MED ORDER — OXYCODONE HCL 5 MG PO TABS
ORAL_TABLET | ORAL | Status: AC
  Filled 2024-06-29: qty 2

## 2024-06-29 MED ORDER — 0.9 % SODIUM CHLORIDE (POUR BTL) OPTIME
TOPICAL | Status: DC | PRN
  Administered 2024-06-29: 1000 mL

## 2024-06-29 MED ORDER — VITAMIN C 500 MG PO TABS
500.0000 mg | ORAL_TABLET | Freq: Every day | ORAL | Status: DC
  Administered 2024-06-30 – 2024-09-05 (×68): 500 mg via ORAL
  Filled 2024-06-29 (×68): qty 1

## 2024-06-29 MED ORDER — VANCOMYCIN HCL 1000 MG IV SOLR
INTRAVENOUS | Status: DC | PRN
  Administered 2024-06-29: 1000 mg

## 2024-06-29 MED ORDER — GENTAMICIN SULFATE 40 MG/ML IJ SOLN
INTRAMUSCULAR | Status: AC
  Filled 2024-06-29: qty 2

## 2024-06-29 MED ORDER — BISACODYL 5 MG PO TBEC
5.0000 mg | DELAYED_RELEASE_TABLET | Freq: Every day | ORAL | Status: DC | PRN
  Administered 2024-08-22: 5 mg via ORAL
  Filled 2024-06-29: qty 1

## 2024-06-29 MED ORDER — VITAMIN D 25 MCG (1000 UNIT) PO TABS
ORAL_TABLET | Freq: Every day | ORAL | Status: DC
  Administered 2024-06-30 – 2024-09-05 (×67): 1000 [IU] via ORAL
  Filled 2024-06-29 (×68): qty 1

## 2024-06-29 MED ORDER — ALBUTEROL SULFATE (2.5 MG/3ML) 0.083% IN NEBU
3.0000 mL | INHALATION_SOLUTION | Freq: Four times a day (QID) | RESPIRATORY_TRACT | Status: DC | PRN
  Administered 2024-06-30: 3 mL via RESPIRATORY_TRACT
  Filled 2024-06-29: qty 3

## 2024-06-29 MED ORDER — THROMBIN (RECOMBINANT) 5000 UNITS EX SOLR
CUTANEOUS | Status: DC | PRN
  Administered 2024-06-29: 10 mL via TOPICAL

## 2024-06-29 MED ORDER — ACETAMINOPHEN 10 MG/ML IV SOLN
INTRAVENOUS | Status: AC
  Filled 2024-06-29: qty 100

## 2024-06-29 MED ORDER — SUGAMMADEX SODIUM 200 MG/2ML IV SOLN
INTRAVENOUS | Status: DC | PRN
  Administered 2024-06-29: 200 mg via INTRAVENOUS

## 2024-06-29 MED ORDER — VANCOMYCIN HCL 1000 MG IV SOLR
INTRAVENOUS | Status: AC
Start: 2024-06-29 — End: 2024-06-29
  Filled 2024-06-29: qty 20

## 2024-06-29 MED ORDER — SENNOSIDES-DOCUSATE SODIUM 8.6-50 MG PO TABS
1.0000 | ORAL_TABLET | Freq: Every evening | ORAL | Status: DC | PRN
  Administered 2024-07-06: 1 via ORAL

## 2024-06-29 MED ORDER — FENTANYL CITRATE (PF) 250 MCG/5ML IJ SOLN
INTRAMUSCULAR | Status: AC
  Filled 2024-06-29: qty 5

## 2024-06-29 MED ORDER — ALBUTEROL SULFATE HFA 108 (90 BASE) MCG/ACT IN AERS
INHALATION_SPRAY | RESPIRATORY_TRACT | Status: DC | PRN
  Administered 2024-06-29 (×2): 2 via RESPIRATORY_TRACT

## 2024-06-29 MED ORDER — DEXAMETHASONE SODIUM PHOSPHATE 10 MG/ML IJ SOLN
INTRAMUSCULAR | Status: DC | PRN
  Administered 2024-06-29: 10 mg via INTRAVENOUS

## 2024-06-29 MED ORDER — ACETAMINOPHEN 650 MG RE SUPP: 650.0000 mg | RECTAL | Status: DC | PRN

## 2024-06-29 MED ORDER — ASPIRIN 81 MG PO TBEC
81.0000 mg | DELAYED_RELEASE_TABLET | Freq: Every day | ORAL | Status: DC
  Administered 2024-06-30 – 2024-09-05 (×68): 81 mg via ORAL
  Filled 2024-06-29 (×68): qty 1

## 2024-06-29 MED ORDER — CEFAZOLIN SODIUM-DEXTROSE 2-3 GM-%(50ML) IV SOLR
INTRAVENOUS | Status: DC | PRN
Start: 2024-06-29 — End: 2024-06-29
  Administered 2024-06-29: 2 g via INTRAVENOUS

## 2024-06-29 MED ORDER — ACETAMINOPHEN 325 MG PO TABS
650.0000 mg | ORAL_TABLET | ORAL | Status: DC | PRN
  Administered 2024-07-03 – 2024-09-05 (×101): 650 mg via ORAL
  Filled 2024-06-29 (×102): qty 2

## 2024-06-29 MED ORDER — CALCIUM-MAGNESIUM-ZINC-D3 333 MG-133 MG-5 MG-5 MCG PO TABS: ORAL_TABLET | Freq: Every morning | ORAL | Status: DC

## 2024-06-29 MED ORDER — ADULT MULTIVITAMIN W/MINERALS CH
1.0000 | ORAL_TABLET | Freq: Every morning | ORAL | Status: DC
  Administered 2024-06-30 – 2024-09-05 (×68): 1 via ORAL
  Filled 2024-06-29 (×69): qty 1

## 2024-06-29 MED ORDER — CEFAZOLIN SODIUM-DEXTROSE 2-4 GM/100ML-% IV SOLN: 2.0000 g | INTRAVENOUS | Status: DC

## 2024-06-29 MED ORDER — ACETAMINOPHEN 10 MG/ML IV SOLN
INTRAVENOUS | Status: DC | PRN
  Administered 2024-06-29: 1000 mg via INTRAVENOUS

## 2024-06-29 MED ORDER — MUPIROCIN 2 % EX OINT
1.0000 | TOPICAL_OINTMENT | Freq: Once | CUTANEOUS | Status: AC
  Administered 2024-06-29: 1 via TOPICAL
  Filled 2024-06-29: qty 22

## 2024-06-29 MED ORDER — PHENYLEPHRINE 80 MCG/ML (10ML) SYRINGE FOR IV PUSH (FOR BLOOD PRESSURE SUPPORT)
PREFILLED_SYRINGE | INTRAVENOUS | Status: DC | PRN
  Administered 2024-06-29: 80 ug via INTRAVENOUS

## 2024-06-29 MED ORDER — DIPHENHYDRAMINE HCL 25 MG PO CAPS
25.0000 mg | ORAL_CAPSULE | Freq: Three times a day (TID) | ORAL | Status: DC | PRN
  Administered 2024-06-29 – 2024-07-09 (×14): 25 mg via ORAL
  Filled 2024-06-29 (×14): qty 1

## 2024-06-29 MED ORDER — ONDANSETRON HCL 4 MG PO TABS
4.0000 mg | ORAL_TABLET | Freq: Four times a day (QID) | ORAL | Status: DC | PRN
  Administered 2024-07-08 – 2024-08-17 (×2): 4 mg via ORAL
  Filled 2024-06-29 (×2): qty 1

## 2024-06-29 MED ORDER — FENTANYL CITRATE (PF) 250 MCG/5ML IJ SOLN
INTRAMUSCULAR | Status: DC | PRN
  Administered 2024-06-29: 25 ug via INTRAVENOUS
  Administered 2024-06-29 (×2): 50 ug via INTRAVENOUS

## 2024-06-29 MED ORDER — CHLORHEXIDINE GLUCONATE 0.12 % MT SOLN
15.0000 mL | Freq: Once | OROMUCOSAL | Status: AC
  Administered 2024-06-29: 15 mL via OROMUCOSAL
  Filled 2024-06-29: qty 15

## 2024-06-29 MED ORDER — CEFAZOLIN SODIUM-DEXTROSE 2-4 GM/100ML-% IV SOLN
INTRAVENOUS | Status: AC
  Filled 2024-06-29: qty 100

## 2024-06-29 SURGICAL SUPPLY — 39 items
BAG COUNTER SPONGE SURGICOUNT (BAG) ×1 IMPLANT
BAND RUBBER #18 3X1/16 STRL (MISCELLANEOUS) ×2 IMPLANT
BENZOIN TINCTURE PRP APPL 2/3 (GAUZE/BANDAGES/DRESSINGS) ×1 IMPLANT
BLADE CLIPPER SURG (BLADE) IMPLANT
BUR MATCHSTICK NEURO 3.0 LAGG (BURR) ×1 IMPLANT
BUR PRECISION FLUTE 5.0 (BURR) ×1 IMPLANT
CANISTER SUCTION 3000ML PPV (SUCTIONS) ×1 IMPLANT
DERMABOND ADVANCED .7 DNX12 (GAUZE/BANDAGES/DRESSINGS) ×1 IMPLANT
DRAPE LAPAROTOMY 100X72X124 (DRAPES) ×1 IMPLANT
DRAPE MICROSCOPE SLANT 54X150 (MISCELLANEOUS) ×1 IMPLANT
DRAPE SURG 17X23 STRL (DRAPES) ×4 IMPLANT
DRESSING PEEL AND PLC PRVNA 13 (GAUZE/BANDAGES/DRESSINGS) IMPLANT
ELECTRODE BLDE 4.0 EZ CLN MEGD (MISCELLANEOUS) ×1 IMPLANT
ELECTRODE REM PT RTRN 9FT ADLT (ELECTROSURGICAL) ×1 IMPLANT
GAUZE 4X4 16PLY ~~LOC~~+RFID DBL (SPONGE) IMPLANT
GLOVE BIOGEL PI IND STRL 8.5 (GLOVE) IMPLANT
GLOVE ECLIPSE 6.5 STRL STRAW (GLOVE) ×1 IMPLANT
GLOVE EXAM NITRILE XL STR (GLOVE) IMPLANT
GLOVE SURG SS PI 8.0 STRL IVOR (GLOVE) IMPLANT
GOWN STRL REUS W/ TWL LRG LVL3 (GOWN DISPOSABLE) IMPLANT
GOWN STRL REUS W/ TWL XL LVL3 (GOWN DISPOSABLE) ×1 IMPLANT
GOWN STRL REUS W/TWL 2XL LVL3 (GOWN DISPOSABLE) IMPLANT
KIT BASIN OR (CUSTOM PROCEDURE TRAY) ×1 IMPLANT
KIT DRSG PREVENA PLUS 7DAY 125 (MISCELLANEOUS) IMPLANT
KIT STIMULAN RAPID CURE 10CC (Orthopedic Implant) IMPLANT
KIT TURNOVER KIT B (KITS) ×1 IMPLANT
NDL HYPO 21X1.5 SAFETY (NEEDLE) IMPLANT
NDL HYPO 22X1.5 SAFETY MO (MISCELLANEOUS) ×2 IMPLANT
NS IRRIG 1000ML POUR BTL (IV SOLUTION) ×1 IMPLANT
PACK LAMINECTOMY NEURO (CUSTOM PROCEDURE TRAY) ×1 IMPLANT
PAD ARMBOARD POSITIONER FOAM (MISCELLANEOUS) ×3 IMPLANT
PATTIES SURGICAL .5 X1 (DISPOSABLE) IMPLANT
SPONGE SURGIFOAM ABS GEL SZ50 (HEMOSTASIS) ×1 IMPLANT
SUT VIC AB 1 CT1 18XBRD ANBCTR (SUTURE) ×2 IMPLANT
SUT VIC AB 3-0 SH 8-18 (SUTURE) ×2 IMPLANT
SYR 5ML LL (SYRINGE) IMPLANT
TOWEL GREEN STERILE (TOWEL DISPOSABLE) ×1 IMPLANT
TOWEL GREEN STERILE FF (TOWEL DISPOSABLE) ×1 IMPLANT
WATER STERILE IRR 1000ML POUR (IV SOLUTION) ×1 IMPLANT

## 2024-06-29 NOTE — Anesthesia Procedure Notes (Signed)
 Procedure Name: Intubation Date/Time: 06/29/2024 10:52 AM  Performed by: Virgil Ee, CRNAPre-anesthesia Checklist: Patient identified, Patient being monitored, Timeout performed, Emergency Drugs available and Suction available Patient Re-evaluated:Patient Re-evaluated prior to induction Oxygen Delivery Method: Circle system utilized Preoxygenation: Pre-oxygenation with 100% oxygen Induction Type: IV induction Ventilation: Mask ventilation without difficulty Laryngoscope Size: Mac and 4 Grade View: Grade I Tube type: Oral Tube size: 7.0 mm Number of attempts: 1 Airway Equipment and Method: Stylet Placement Confirmation: ETT inserted through vocal cords under direct vision, positive ETCO2 and breath sounds checked- equal and bilateral Secured at: 22 cm Tube secured with: Tape Dental Injury: Teeth and Oropharynx as per pre-operative assessment

## 2024-06-29 NOTE — Anesthesia Preprocedure Evaluation (Signed)
 Anesthesia Evaluation  Patient identified by MRN, date of birth, ID band Patient awake    Reviewed: Allergy & Precautions, NPO status , Patient's Chart, lab work & pertinent test results  History of Anesthesia Complications Negative for: history of anesthetic complications  Airway Mallampati: III  TM Distance: >3 FB Neck ROM: Full    Dental  (+) Dental Advisory Given, Edentulous Upper, Edentulous Lower   Pulmonary shortness of breath, asthma , sleep apnea and Continuous Positive Airway Pressure Ventilation , former smoker   breath sounds clear to auscultation       Cardiovascular hypertension, Pt. on medications and Pt. on home beta blockers (-) angina (-) Past MI and (-) CHF  Rhythm:Regular  1. Left ventricular ejection fraction, by estimation, is 60 to 65%. The  left ventricle has normal function. The left ventricle has no regional  wall motion abnormalities.   2. Right ventricular systolic function is normal. The right ventricular  size is normal.   3. No left atrial/left atrial appendage thrombus was detected.   4. The mitral valve is normal in structure. Mild mitral valve  regurgitation. No evidence of mitral stenosis.   5. The aortic valve is normal in structure. Aortic valve regurgitation is  not visualized. No aortic stenosis is present.   6. The inferior vena cava is normal in size with greater than 50%  respiratory variability, suggesting right atrial pressure of 3 mmHg.     Neuro/Psych  Headaches PSYCHIATRIC DISORDERS Anxiety Depression     Neuromuscular disease    GI/Hepatic Neg liver ROS, hiatal hernia,GERD  Medicated and Controlled,,  Endo/Other  negative endocrine ROS    Renal/GU negative Renal ROSLab Results      Component                Value               Date                      NA                       143                 04/08/2024                K                        3.5                 04/08/2024                 CO2                      35 (H)              04/08/2024                GLUCOSE                  80                  04/08/2024                BUN                      14  04/08/2024                CREATININE               0.45 (L)            04/08/2024                CALCIUM                   10.1                04/08/2024                EGFR                     109                 04/08/2024                GFRNONAA                 >60                 02/02/2024                Musculoskeletal  (+) Arthritis ,  Fibromyalgia -  Abdominal   Peds  Hematology Lab Results      Component                Value               Date                      WBC                      12.0 (H)            06/29/2024                HGB                      11.9 (L)            06/29/2024                HCT                      40.7                06/29/2024                MCV                      83.7                06/29/2024                PLT                      293                 06/29/2024              Anesthesia Other Findings   Reproductive/Obstetrics                              Anesthesia Physical Anesthesia Plan  ASA: 3  Anesthesia Plan: General  Post-op Pain Management: Ofirmev  IV (intra-op)*   Induction: Intravenous  PONV Risk Score and Plan: 3 and Ondansetron  and Dexamethasone   Airway Management Planned: Oral ETT  Additional Equipment: None  Intra-op Plan:   Post-operative Plan: Extubation in OR  Informed Consent: I have reviewed the patients History and Physical, chart, labs and discussed the procedure including the risks, benefits and alternatives for the proposed anesthesia with the patient or authorized representative who has indicated his/her understanding and acceptance.     Dental advisory given  Plan Discussed with: CRNA  Anesthesia Plan Comments:          Anesthesia Quick Evaluation

## 2024-06-29 NOTE — H&P (Signed)
 [name] is a 63 y.o. female With thoracic stenosis at T9/10 and lower extremity weakness. She will be admitted for thoracic decompression.  Allergies  Allergen Reactions   Monosodium Glutamate Anaphylaxis and Swelling    Eyes swollen shut, facial swelling, tongue swelling.   Shellfish Allergy Anaphylaxis   Melatonin Other (See Comments)    Muscle spasms involuntary movement   Baclofen Nausea Only    Dizziness and increase muscle spasm   Celebrex  [Celecoxib ] Itching    Only allergic to generic brand   Ciprofloxacin  Other (See Comments)    inability to walk/Unable to walk   Contrast Media [Iodinated Contrast Media] Itching and Nausea Only    could not walk   Diclofenac  Itching    Generic Diclefenac gel causes itching. Can take the name brand Voltaren  gel   Gadolinium     Allergic to MRI contrast dye per patient.   Molds & Smuts Other (See Comments)    Causes allergies to flair up/sinus issues/headaches     Other Other (See Comments)    Pet dander,        Promethazine  Itching   Statins Other (See Comments)    pain   Sulfa  Antibiotics Other (See Comments)    Rash, migraine    Latex Itching and Rash    Latex glove with powder; bite blocks used for EGD studies   Past Medical History:  Diagnosis Date   Anemia    Ankle syndesmosis disruption    Anxiety    takes Ativan  and Valium , after mother passed   Arthritis    bilateral knees s/p knee replacement bilaterally   Asthma    Bronchitis    Bruising    pt states unexplained d/t fibromyalgia   Chronic back pain    2012 tailbone surgery and 3 lower discs.    Closed fracture of distal end of right fibula and tibia    Depression    from Fibromyalgia diagnosis; not taking medicine. since 2001   Dizziness    rarely   Fibromyalgia    diagnosed 2001   GERD (gastroesophageal reflux disease)    Prilosec occasionally   Headache(784.0)    sinus headaches   History of hiatal hernia    Hypertension    since 2013    Hyperthyroidism    subclinical, no treatment; thyroid  nodules   IBS (irritable bowel syndrome)    Impaired memory    states from fibromyalgia   Insomnia    takes Ambien    Jones fracture    left foot fifth metatarsal   Multiple allergies    including latex, pet dander, shellfish, pet dander   Painful orthopaedic hardware right ankle 05/27/2016   Seasonal allergies    Shortness of breath    Occasional with exertion;    Sleep apnea    Sore gums    this is why pt is on Amoxil-only takes for dental work   Tachycardia    Thyroid  goiter    Varicose vein    protrudes above skin-per pt;vein popped and bruised;ultrasound done to make sure that there were no clots;noclots were found   Past Surgical History:  Procedure Laterality Date   ANTERIOR CERVICAL DECOMP/DISCECTOMY FUSION N/A 12/08/2020   Procedure: Cervical Four-Five Anterior cervical decompression/discectomy/fusion;  Surgeon: Gillie Duncans, MD;  Location: [facility] OR;  Service: Neurosurgery;  Laterality: N/A;  anterior   ANTERIOR LUMBAR FUSION  09/20/2011   Procedure: ANTERIOR LUMBAR FUSION 1 LEVEL;  Surgeon: Duncans LITTIE Gillie;  Location: [facility] ORS;  Service: Neurosurgery;  Laterality: N/A;  Lumbar five-Sacral One Anterior Lumbar Interbody Fusion /Dr. Early to Approach    APPLICATION OF WOUND VAC N/A 05/14/2022   Procedure: Removal of wound vac with closure of wound;  Surgeon: Gillie Duncans, MD;  Location: [facility] OR;  Service: Neurosurgery;  Laterality: N/A;  Pt to be admitted on 05-12-2022   CERVICAL DISC SURGERY     WITH TITANIUM PLATE IN NECK---LEFT SIDE   CERVICAL SPINE SURGERY  03/24/2019   CESAREAN SECTION     EYE SURGERY Bilateral 2023   cataract   fibroidectomy     FRACTURE SURGERY  05/08/2010   Jones fracture left foot fifth metatarsal   HARDWARE REMOVAL Right 05/28/2016   Procedure: RIGHT ANKLE HARDWARE REMOVAL;  Surgeon: Lamar Millman, MD;  Location: [facility];  Service: Orthopedics;  Laterality: Right;    HARDWARE REMOVAL N/A 07/28/2020   Procedure: Removal of Lumbar Hardware;  Surgeon: Gillie Duncans, MD;  Location: [facility] OR;  Service: Neurosurgery;  Laterality: N/A;   HERNIA REPAIR     hiatal hernia   IR THORACIC DISC ASPIRATION W/IMG GUIDE  10/29/2023   IRRIGATION AND DEBRIDEMENT KNEE  04/09/2012   Procedure: IRRIGATION AND DEBRIDEMENT KNEE;  Surgeon: LELON JONETTA Shari Mickey., MD;  Location: [facility] OR;  Service: Orthopedics;  Laterality: Right;   JOINT REPLACEMENT  01/27/2012   left total knee and Right total knee   KNEE SURGERY  2005   Left knee arthroscopy   LAMINECTOMY WITH POSTERIOR LATERAL ARTHRODESIS LEVEL 4 Bilateral 03/22/2022   Procedure: Thoracic ten to Lumbar three Posterior lateral arthrodesis with screws;  Surgeon: Gillie Duncans, MD;  Location: [facility] OR;  Service: Neurosurgery;  Laterality: Bilateral;   LUMBAR WOUND DEBRIDEMENT N/A 04/08/2022   Procedure: LUMBAR WOUND DEBRIDEMENT;  Surgeon: Gillie Duncans, MD;  Location: [facility] OR;  Service: Neurosurgery;  Laterality: N/A;   NASAL SEPTOPLASTY W/ TURBINOPLASTY  2007   due to recurrent sinusitis   ORIF ANKLE FRACTURE Right 06/21/2015   Procedure: OPEN REDUCTION INTERNAL FIXATION RIGHT DISTAL FIBULA  FRACTURE AND OPEN REDUCTION INTERNAL FIXATION SYNDESMOSIS ;  Surgeon: Lamar Millman, MD;  Location: [facility];  Service: Orthopedics;  Laterality: Right;   PARTIAL HYSTERECTOMY     SHOULDER ARTHROSCOPY W/ ROTATOR CUFF REPAIR Right    SHOULDER SURGERY Left    SPINE SURGERY  2004   Cervical plate, ACDF   STERIOD INJECTION  01/27/2012   Procedure: STEROID INJECTION;  Surgeon: Lamar DELENA Millman, MD;  Location: [facility] OR;  Service: Orthopedics;  Laterality: Right;   TEE WITHOUT CARDIOVERSION N/A 03/01/2020   Procedure: TRANSESOPHAGEAL ECHOCARDIOGRAM (TEE);  Surgeon: Jeffrie Oneil BROCKS, MD;  Location: [facility] ENDOSCOPY;  Service: Cardiovascular;  Laterality: N/A;   THORACIC DISCECTOMY  02/16/2020   Procedure: Thoracic ten-eleven Discectomy;  Surgeon: Gillie Duncans, MD;  Location: [facility] OR;  Service: Neurosurgery;;   THORACIC DISCECTOMY N/A 01/08/2024   Procedure: THORACIC NINE-TEN LAMINECTOMY AND DECOMPRESSION;  Surgeon: Gillie Duncans, MD;  Location: [facility] OR;  Service: Neurosurgery;  Laterality: N/A;   TONSILLECTOMY  2007   TOTAL KNEE ARTHROPLASTY  01/27/2012   Procedure: TOTAL KNEE ARTHROPLASTY;  Surgeon: Lamar DELENA Millman, MD;  Bilateral   TOTAL KNEE ARTHROPLASTY  04/06/2012   Procedure: TOTAL KNEE ARTHROPLASTY;  Surgeon: Lamar DELENA Millman, MD;  Location: [facility] OR;  Service: Orthopedics;  Laterality: Right;   TUBAL LIGATION     UTERINE FIBROID SURGERY     mid 200s   WOUND EXPLORATION N/A 02/27/2020   Procedure:  WOUND EXPLORATION;  Surgeon: Gillie Duncans, MD;  Location: [facility] OR;  Service: Neurosurgery;  Laterality: N/A;,  (wound vac upper back)   WOUND EXPLORATION N/A 03/30/2022   Procedure: THORACIC WOUND EXPLORATION WITH REPAIR OF CSF LEAK;  Surgeon: Gillie Duncans, MD;  Location: [facility] OR;  Service: Neurosurgery;  Laterality: N/A;   Family History  Problem Relation Age of Onset   Stroke Father        passed 2004 from pneumonia   Alcohol  abuse Father    Pulmonary embolism Mother        after minor knee surgery leading to DVT   Arthritis Sister    Depression Sister    Hypertension Sister    Diabetes Other        grandmother   Anesthesia problems Neg Hx    Hypotension Neg Hx    Malignant hyperthermia Neg Hx    Pseudochol deficiency Neg Hx    Social History   Socioeconomic History   Marital status: Divorced    Spouse name: Not on file   Number of children: 1   Years of education: Not on file   Highest education level: Not on file  Occupational History   Not on file  Tobacco Use   Smoking status: Former    Current packs/day: 0.00    Average packs/day: 0.5 packs/day for 10.0 years (5.0 ttl pk-yrs)    Types: Cigarettes    Start date: 12/05/1988    Quit date: 12/06/1998    Years since quitting: 25.5   Smokeless tobacco: Never  Vaping Use   Vaping  status: Never Used  Substance and Sexual Activity   Alcohol  use: No   Drug use: No   Sexual activity: Not Currently    Birth control/protection: Post-menopausal  Other Topics Concern   Not on file  Social History Narrative   Divorced. 1 son.    Disabled from fibromyalgia, arthritis, back and knee surgeries.    Graduated from High school.       Sister is a patient in our clinic, [name].    Recently cared for at [facility]. Changed to us  as new network.    Social Drivers of Corporate investment banker Strain: Not on file  Food Insecurity: No Food Insecurity (01/16/2024)   Hunger Vital Sign    Worried About Running Out of Food in the Last Year: Never true    Ran Out of Food in the Last Year: Never true  Transportation Needs: No Transportation Needs (01/16/2024)   PRAPARE - Administrator, Civil Service (Medical): No    Lack of Transportation (Non-Medical): No  Physical Activity: Not on file  Stress: Not on file  Social Connections: Not on file  Intimate Partner Violence: Not At Risk (01/16/2024)   Humiliation, Afraid, Rape, and Kick questionnaire    Fear of Current or Ex-Partner: No    Emotionally Abused: No    Physically Abused: No    Sexually Abused: No   Physical Exam Constitutional:      Appearance: She is obese. She is ill-appearing.  HENT:     Head: Normocephalic and atraumatic.     Right Ear: Tympanic membrane normal.     Left Ear: Tympanic membrane normal.     Nose: Nose normal.     Mouth/Throat:     Mouth: Mucous membranes are moist.     Pharynx: Oropharynx is clear.  Eyes:     Extraocular Movements: Extraocular movements intact.  Pupils: Pupils are equal, round, and reactive to light.  Cardiovascular:     Rate and Rhythm: Normal rate and regular rhythm.  Pulmonary:     Effort: Pulmonary effort is normal.     Breath sounds: Normal breath sounds.  Abdominal:     General: Abdomen is flat.  Musculoskeletal:     Cervical  back: Normal range of motion.     Left lower leg: Edema present.  Skin:    General: Skin is warm and dry.  Neurological:     Mental Status: She is alert and oriented to person, place, and time.     Cranial Nerves: Cranial nerve deficit present.     Sensory: Sensory deficit present.     Motor: Weakness present.     Coordination: Coordination abnormal.     Gait: Gait abnormal.     Deep Tendon Reflexes: Reflexes abnormal.  Psychiatric:        Mood and Affect: Mood normal.        Behavior: Behavior normal.        Thought Content: Thought content normal.        Judgment: Judgment normal.   OR for decompression. Bleeding, infection,  Need for more surgery, spinal cord injury, spinal fluid leak, no neurological change, neurological worsening, no improvement. She understands and wishes to proceed.

## 2024-06-29 NOTE — Transfer of Care (Signed)
 Immediate Anesthesia Transfer of Care Note  Patient: [name]  Procedure(s) Performed: THORACIC NINE THORACIC TEN LAMINECTOMY with  prevena plus 125 therapy  Patient Location: PACU  Anesthesia Type:General  Level of Consciousness: drowsy and responds to stimulation  Airway & Oxygen Therapy: Patient Spontanous Breathing and Patient connected to face mask oxygen  Post-op Assessment: Report given to RN and Post -op Vital signs reviewed and stable  Post vital signs: Reviewed and stable  Last Vitals:  Vitals Value Taken Time  BP 131/102 06/29/24 13:41  Temp    Pulse 81 06/29/24 13:45  Resp 14 06/29/24 13:45  SpO2 97 % 06/29/24 13:45  Vitals shown include unfiled device data.  Last Pain:  Vitals:   06/29/24 0646  TempSrc:   PainSc: 10-Worst pain ever         Complications: No notable events documented.

## 2024-06-30 ENCOUNTER — Encounter (HOSPITAL_COMMUNITY): Payer: Self-pay | Admitting: Neurosurgery

## 2024-06-30 MED ORDER — INFLUENZA VIRUS VACC SPLIT PF (FLUZONE) 0.5 ML IM SUSY: 0.5000 mL | PREFILLED_SYRINGE | INTRAMUSCULAR | Status: DC

## 2024-06-30 MED FILL — Thrombin For Soln 5000 Unit: CUTANEOUS | Qty: 2 | Status: AC

## 2024-06-30 NOTE — Evaluation (Signed)
 Occupational Therapy Evaluation Patient Details Name: [name] MRN: [id number] DOB: 06-08-1961 Today's Date: 06/30/2024   History of Present Illness   Pt is a 63 y/o F admitted for thoracic stenosis at T9/10 and LE weakness, now s/p thoracic decompression with neurosurgery on 06/29/24. Of note, pt had T9-10 laminectomy on 01/08/24 and was admitted to [facility] for rehab. PMHx: GERD, anxiety, HTN, fibromyalgia, hyperthyroidism, IBS, ACDF 3/4, posterior lateral arthrodesis 6/16, CSF leak 6/24.     Clinical Impressions Pt admitted as detailed above. PTA, pt was living at home and has essentially been bedridden since May 2025. Her aunt and girlfriend have been assisting with all bed mobility and ADLs/IADLs. Today she presents with LUE>RUE weakness (proximal>distal) and tangential thoughts/perseverative on random things (ie plugging in wound vac, obtaining contact solution, locating her LE AFO, etc.). Functionally she required max A +2 for bed mobility and mod A +2 to stand at EOB. Dependent assist for LE ADLs and anticipated min-mod A for seated UE ADLs given impaired sitting balance. Returned to supine at end of session, RN updated on status.   Pt is currently functioning below baseline and would benefit from ongoing acute OT services to progress towards safe discharge and to facilitate return to prior level of function. OT to continue to follow.     If plan is discharge home, recommend the following:   Two people to help with walking and/or transfers;Two people to help with bathing/dressing/bathroom;Assist for transportation;Help with stairs or ramp for entrance;Assistance with cooking/housework     Functional Status Assessment   Patient has had a recent decline in their functional status and demonstrates the ability to make significant improvements in function in a reasonable and predictable amount of time.     Equipment Recommendations   Other (comment) (defer to next level of  care)     Recommendations for Other Services   Rehab consult     Precautions/Restrictions   Precautions Precautions: Back;Fall Precaution Booklet Issued: No Recall of Precautions/Restrictions: Impaired Precaution/Restrictions Comments: cues to recall BLT's and log roll technique for bed mobility Required Braces or Orthoses:  (no brace needed per active orders) Restrictions Weight Bearing Restrictions Per Provider Order: No     Mobility Bed Mobility Overal bed mobility: Needs Assistance Bed Mobility: Rolling, Supine to Sit, Sit to Supine Rolling: Max assist, +2 for physical assistance, Used rails   Supine to sit: Max assist, +2 for physical assistance, HOB elevated, Used rails Sit to supine: Max assist, +2 for physical assistance, HOB elevated, Used rails   General bed mobility comments: Cued through log roll technique, needs assist for LE mgmt and trunk elevation.    Transfers Overall transfer level: Needs assistance   Transfers: Sit to/from Stand Sit to Stand: Mod assist, +2 physical assistance, From elevated surface           General transfer comment: Stood with cues for hand placement, able to achieve full upright stance posture without knee buckling noted.      Balance Overall balance assessment: Needs assistance Sitting-balance support: Bilateral upper extremity supported, Feet supported Sitting balance-[name]: Poor Sitting balance - Comments: Posterior lean at EOB despite BUE support on mattress, worsening as she fatigued. Cues to engage core muscles to right posture to neutral spine. Postural control: Posterior lean Standing balance support: Bilateral upper extremity supported Standing balance-[name]: Poor Standing balance comment: Mod A +2 to sustain static standing, cues for forward gaze and upright posture.  ADL either performed or assessed with clinical judgement   ADL Overall ADL's : Needs  assistance/impaired Eating/Feeding: Set up;Bed level Eating/Feeding Details (indicate cue type and reason): pt denied difficulty feeding self; observation/simulation bringing hand to mouth                 Lower Body Dressing: Total assistance;Cueing for back precautions;Bed level Lower Body Dressing Details (indicate cue type and reason): OT adjusting/donning B socks bed level, reports she has assist with this at baseline     Toileting- Clothing Manipulation and Hygiene: Total assistance Toileting - Clothing Manipulation Details (indicate cue type and reason): anticipated due to functional deficits; purewick intact             Vision Baseline Vision/History: 1 Wears glasses (wears contacts) Ability to See in Adequate Light: 0 Adequate Patient Visual Report: No change from baseline Vision Assessment?: No apparent visual deficits     Perception         Praxis         Pertinent Vitals/Pain Pain Assessment Pain Assessment: 0-10 Pain Score:  (unrated) Pain Location: back (surgical site) and L shoulder (chronic neuropathic pain) Pain Descriptors / Indicators: Pins and needles, Grimacing Pain Intervention(s): Repositioned, Limited activity within patient's tolerance (RN reports pt received pain meds just prior to OT arrival)     Extremity/Trunk Assessment Upper Extremity Assessment Upper Extremity Assessment: RUE deficits/detail;LUE deficits/detail;Right hand dominant RUE: Shoulder pain with ROM (limited shoulder flexion at end range) RUE Sensation: WNL RUE Coordination: WNL LUE Deficits / Details: endorses B rotator cuff surgeries/injuries (L>R residual deficits related to ROM/pain and neuropathic pain) shoulder flexion AROM limited to ~100*; other joints distally grossly WFL; strength grossly 3+/5 LUE: Shoulder pain with ROM LUE Sensation: WNL LUE Coordination: WNL   Lower Extremity Assessment Lower Extremity Assessment: Defer to PT evaluation   Cervical / Trunk  Assessment Cervical / Trunk Assessment: Normal   Communication Communication Communication: No apparent difficulties   Cognition Arousal: Alert Behavior During Therapy: WFL for tasks assessed/performed Cognition: No apparent impairments             OT - Cognition Comments: talkative & tangential; hyperverbal at times                 Following commands: Intact       Cueing  General Comments   Cueing Techniques: Verbal cues;Visual cues      Exercises     Shoulder Instructions      Home Living Family/patient expects to be discharged to:: Private residence Living Arrangements: Other relatives (aunt has been staying with patient since May 2025) Available Help at Discharge: Friend(s);Family;Available PRN/intermittently (aunt (works) and girlfriend (also works) - PRN assist) Type of Home: House Home Access: Ramped entrance     Home Layout: One level     Bathroom Shower/Tub: Tub/shower unit;Curtain (has assist for bathing, has not gotten into her shower since prior to April back surgery)         Home Equipment: Wheelchair - manual (upright rollator, w/c with removable arm rests & leg rests, bed pan)   Additional Comments: has been using transportation through [employer]; aunt helps laterally scoot pt over to w/c via gait belt      Prior Functioning/Environment Prior Level of Function : History of Falls (last six months);Driving;Needs assist       Physical Assist : ADLs (physical);Mobility (physical) Mobility (physical): Transfers ADLs (physical): Bathing;Dressing;Toileting;IADLs Mobility Comments: Pt reports she has not stood/been OOB since May 2025,  following CIR d/c in April 2025, she was mod I ambulating with upright rollator. Pt endorses she did not have a good experience with HH therapies and once she transitioned to p.o. ABX she was unable to move/progress walking. ADLs Comments: since May 2025, pt has had assist for most ADLs (bed level  bathing, bed level toileting using bed pan, and dressing)    OT Problem List: Decreased strength;Decreased range of motion;Decreased activity tolerance;Impaired balance (sitting and/or standing);Decreased knowledge of precautions;Obesity;Impaired UE functional use;Pain;Increased edema   OT Treatment/Interventions: Self-care/ADL training;Therapeutic exercise;DME and/or AE instruction;Therapeutic activities;Patient/family education;Balance training      OT Goals(Current goals can be found in the care plan section)   Acute Rehab OT Goals Patient Stated Goal: to be able to walk again OT Goal Formulation: With patient Time For Goal Achievement: 07/14/24 Potential to Achieve Goals: Fair   OT Frequency:  Min 2X/week    Co-evaluation PT/OT/SLP Co-Evaluation/Treatment: Yes Reason for Co-Treatment: For patient/therapist safety;To address functional/ADL transfers   OT goals addressed during session: ADL's and self-care      AM-PAC OT 6 Clicks Daily Activity     Outcome Measure Help from another person eating meals?: A Little Help from another person taking care of personal grooming?: A Lot Help from another person toileting, which includes using toliet, bedpan, or urinal?: Total Help from another person bathing (including washing, rinsing, drying)?: A Lot Help from another person to put on and taking off regular upper body clothing?: A Lot Help from another person to put on and taking off regular lower body clothing?: Total 6 Click Score: 11   End of Session Equipment Utilized During Treatment: Gait belt Nurse Communication: Mobility status  Activity Tolerance: Patient tolerated treatment well Patient left: in bed;with call bell/phone within reach;with bed alarm set;with SCD's reapplied  OT Visit Diagnosis: Unsteadiness on feet (R26.81);Muscle weakness (generalized) (M62.81);Pain Pain - part of body:  (back)                Time: 9173-9087 OT Time Calculation (min): 46  min Charges:  OT General Charges $OT Visit: 1 Visit OT Evaluation $OT Eval Moderate Complexity: 1 Mod OT Treatments $Self Care/Home Management : 8-22 mins  Mohogany Toppins D., MS, OTR/L Acute Rehabilitation Services [phone] Secure Chat Preferred  Rikki Milch 06/30/2024, 11:08 AM

## 2024-06-30 NOTE — TOC Initial Note (Signed)
 Transition of Care [facility]) - Initial/Assessment Note    Patient Details  Name: [name] MRN: [id number] Date of Birth: [birth year]  Transition of Care [facility]) CM/SW Contact:    Jill [name], RN Phone Number: 06/30/2024, 2:47 PM  Clinical Narrative:                 With thoracic stenosis at T9/10 and lower extremity weakness.  Pt is s/p surgery.   She is from home alone but has had aunts/ nephew/ friends assisting her.  Current recommendations are for CIR. Awaiting work up.   IP Care management following.  Expected Discharge Plan: IP Rehab Facility Barriers to Discharge: Continued Medical Work up   Patient Goals and CMS Choice   CMS Medicare.gov Compare Post Acute Care list provided to:: Patient Choice offered to / list presented to : Patient      Expected Discharge Plan and Services   Discharge Planning Services: CM Consult Post Acute Care Choice: IP Rehab Living arrangements for the past 2 months: Single Family Home                                      Prior Living Arrangements/Services Living arrangements for the past 2 months: Single Family Home Lives with:: Self Patient language and need for interpreter reviewed:: Yes Do you feel safe going back to the place where you live?: Yes          Current home services: DME (wheelchair/ rollator) Criminal Activity/Legal Involvement Pertinent to Current Situation/Hospitalization: No - Comment as needed  Activities of Daily Living   ADL Screening (condition at time of admission) Independently performs ADLs?: No Does the patient have a NEW difficulty with bathing/dressing/toileting/self-feeding that is expected to last >3 days?: No Does the patient have a NEW difficulty with getting in/out of bed, walking, or climbing stairs that is expected to last >3 days?: No Does the patient have a NEW difficulty with communication that is expected to last >3 days?: No Is the patient deaf or have difficulty hearing?:  No Does the patient have difficulty seeing, even when wearing glasses/contacts?: No Does the patient have difficulty concentrating, remembering, or making decisions?: No  Permission Sought/Granted                  Emotional Assessment Appearance:: Appears stated age Attitude/Demeanor/Rapport: Engaged Affect (typically observed): Accepting Orientation: : Oriented to Self, Oriented to Place, Oriented to  Time, Oriented to Situation   Psych Involvement: No (comment)  Admission diagnosis:  Spinal stenosis, thoracic region [M48.04] Thoracic spinal stenosis [M48.04] Patient Active Problem List   Diagnosis Date Noted   Weakness of both lower extremities 04/08/2024   Neurogenic bowel 01/20/2024   Neurogenic bladder 01/20/2024   Thoracic discitis 01/19/2024   Thoracic myelopathy 01/19/2024   Thyroid  dysfunction 01/14/2024   Irritable bowel syndrome 01/14/2024   Thoracic stenosis 01/08/2024   Thoracic spinal stenosis 01/08/2024   Obesity, Class III, BMI 40-49.9 (morbid obesity) 12/30/2023   History of long term use of antibiotics due to prior spinal hardware infection MSSA and Enterobacter cloacae 12/30/2023   Osteomyelitis of thoracic region [facility]) 12/16/2023   Medication management 12/15/2023   Falls frequently 11/27/2023   Problem with vascular access--PICC line dislodgement 11/26/2023   T9 discitis/osteomyelitis and T9 epidural phlegmon/abscess s/p aspiration 10/29/2023 10/28/2023   Wound infection after surgery 05/14/2022   Delayed surgical wound healing 05/12/2022   Postoperative  wound infection 04/08/2022   Scoliosis due to degenerative disease of spine in adult patient 03/30/2022   HNP (herniated nucleus pulposus), cervical 12/08/2020   Lumbago with sciatica, unspecified side 07/28/2020   PICC (peripherally inserted central catheter) in place 03/16/2020   Encounter for long-term (current) use of antibiotics 03/16/2020   Polypharmacy 03/09/2020   At risk for adverse drug  event 03/09/2020   MSSA bacteremia 02/29/2020   Postoperative infection 02/28/2020   Abnormal gait due to muscle weakness 02/26/2020   HNP (herniated nucleus pulposus), lumbar 02/16/2020   Muscle weakness of lower extremity 02/14/2020   Spondylolisthesis of lumbar region 10/30/2017   Painful orthopaedic hardware right ankle 05/27/2016   Closed fracture of distal end of right fibula and tibia    Ankle syndesmosis disruption    ASCUS favor benign 01/06/2014   Leg swelling 12/22/2013   Seasonal allergies    Anemia    Chronic back pain due to DJD with history surgery    Hyperthyroidism    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 [name] [address] Phone: [phone] Fax: [phone]  [id number] [place], [name] - [address] AT [facility] [address] [name] [address] Phone: [phone] Fax: [phone]  [facility] [address] [name] [address] Phone: [phone] Fax: [phone]     Social Drivers of Health (SDOH) Social History: SDOH Screenings   Food Insecurity: No Food Insecurity (06/30/2024)  Housing: Low Risk  (06/30/2024)  Transportation Needs: No Transportation Needs (06/30/2024)  Utilities: Not At Risk (06/30/2024)  Depression (PHQ2-9): Low Risk  (01/12/2024)  Tobacco Use: Medium Risk (06/29/2024)   SDOH Interventions:     Readmission Risk Interventions    05/17/2022    3:02 PM  Readmission Risk Prevention Plan  Transportation Screening Complete  PCP or  Specialist Appt within 5-7 Days Complete  Home Care Screening Complete  Medication Review (RN CM) Complete

## 2024-06-30 NOTE — Progress Notes (Signed)
   Inpatient Rehab Admissions Coordinator :  Per therapy recommendations patient was screened for CIR candidacy by Ottie Glazier RN MSN. Patient is not yet at a level to tolerate the intensity required to pursue a CIR admit . The CIR admissions team will follow and monitor for progress and place a Rehab Consult order if felt to be appropriate. Please contact me with any questions.  Ottie Glazier RN MSN Admissions Coordinator [phone]

## 2024-06-30 NOTE — Progress Notes (Signed)
 Patient ID: [name], female   DOB: 05/23/1961, 63 y.o.   MRN: [id number] BP 130/62 (BP Location: Right Arm)   Pulse 92   Temp 98.4 F (36.9 C)   Resp 19   Ht 5' 2 (1.575 m)   LMP 11/20/2012   SpO2 100%   BMI 40.08 kg/m  Alert and oriented x4. Speech is clear and fluent. Moving lower extremities well in bed.  Will need pt and ot Cultures negative at this time.  Wound vac in place.

## 2024-06-30 NOTE — Evaluation (Signed)
 Physical Therapy Evaluation Patient Details Name: [name] MRN: [id number] DOB: [birth year] Today's Date: 06/30/2024  History of Present Illness  Pt is a 63 y/o F admitted for thoracic stenosis at T9/10 and LE weakness, now s/p thoracic decompression with neurosurgery on 06/29/24. Of note, pt had T9-10 laminectomy on 01/08/24 and was admitted to [facility] for rehab. PMHx: GERD, anxiety, HTN, fibromyalgia, hyperthyroidism, IBS, ACDF 3/4, posterior lateral arthrodesis 6/16, CSF leak 6/24.   Clinical Impression  Pt in bed upon arrival and agreeable to PT eval. PTA, pt reports primarily staying in the bed with assist needed to perform lateral scoots to manual WC. In today's session pt required MaxAx2 for bed mobility and ModA/MaxA for seated balance. Pt had a posterior lean with increased difficulty maintaining upright position. Able to stand with ModAx2 and [facility] with pt unable to maintain stand for >10 seconds due to pain. Recommending post-acute rehab >3hrs to work towards independence with mobility. Pt would benefit from acute skilled PT with current functional limitations listed below (see PT Problem List). Acute PT to follow.         If plan is discharge home, recommend the following: A lot of help with walking and/or transfers;A lot of help with bathing/dressing/bathroom;Assist for transportation;Help with stairs or ramp for entrance   Can travel by private vehicle    No    Equipment Recommendations Other (comment) (TBD)  Recommendations for Other Services  Rehab consult    Functional Status Assessment Patient has had a recent decline in their functional status and demonstrates the ability to make significant improvements in function in a reasonable and predictable amount of time.     Precautions / Restrictions Precautions Precautions: Back;Fall Precaution Booklet Issued: No Recall of Precautions/Restrictions: Impaired Precaution/Restrictions Comments: R AFO, cues to recall BLT's and  log roll technique for bed mobility Required Braces or Orthoses:  (no brace needed per active orders) Restrictions Weight Bearing Restrictions Per Provider Order: No      Mobility  Bed Mobility Overal bed mobility: Needs Assistance Bed Mobility: Rolling, Supine to Sit, Sit to Supine Rolling: Max assist, +2 for physical assistance, Used rails   Supine to sit: Max assist, +2 for physical assistance, HOB elevated, Used rails Sit to supine: Max assist, +2 for physical assistance, HOB elevated, Used rails   General bed mobility comments: Cued through log roll technique, needs assist for LE mgmt and trunk elevation.    Transfers Overall transfer level: Needs assistance   Transfers: Sit to/from Stand Sit to Stand: Mod assist, +2 physical assistance, From elevated surface      General transfer comment: Stood with cues for hand placement, able to achieve full upright stance posture without knee buckling noted.     Balance Overall balance assessment: Needs assistance Sitting-balance support: Bilateral upper extremity supported, Feet supported Sitting balance-[name] Scale: Poor Sitting balance - Comments: Posterior lean at EOB despite BUE support on mattress, worsening as she fatigued. Cues to engage core muscles to right posture to neutral spine. Postural control: Posterior lean Standing balance support: Bilateral upper extremity supported Standing balance-[name] Scale: Poor Standing balance comment: Mod A +2 to sustain static standing, cues for forward gaze and upright posture.       Pertinent Vitals/Pain Pain Assessment Pain Assessment: Faces Faces Pain Scale: Hurts even more Pain Location: back (surgical site) and L shoulder (chronic neuropathic pain) Pain Descriptors / Indicators: Pins and needles, Grimacing Pain Intervention(s): Limited activity within patient's tolerance, Monitored during session, Repositioned    Home  Living Family/patient expects to be discharged to::  Private residence Living Arrangements: Other relatives (aunt has been staying with patient since May 2025) Available Help at Discharge: Friend(s);Family;Available PRN/intermittently (aunt (works) and girlfriend (also works) - PRN assist) Type of Home: House Home Access: Ramped entrance       Home Layout: One level Home Equipment: Wheelchair - manual (upright rollator, w/c with removable arm rests & leg rests, bed pan) Additional Comments: has been using transportation through [employer]; aunt helps laterally scoot pt over to w/c via gait belt    Prior Function Prior Level of Function : History of Falls (last six months);Driving;Needs assist       Physical Assist : ADLs (physical);Mobility (physical) Mobility (physical): Transfers ADLs (physical): Bathing;Dressing;Toileting;IADLs Mobility Comments: Pt reports she has not stood/been OOB since May 2025, following CIR d/c in April 2025, she was mod I ambulating with upright rollator. Pt endorses she did not have a good experience with HH therapies and once she transitioned to p.o. ABX she was unable to move/progress walking. ADLs Comments: since May 2025, pt has had assist for most ADLs (bed level bathing, bed level toileting using bed pan, and dressing)     Extremity/Trunk Assessment   Upper Extremity Assessment Upper Extremity Assessment: Defer to OT evaluation RUE: Shoulder pain with ROM (limited shoulder flexion at end range) RUE Sensation: WNL RUE Coordination: WNL LUE Deficits / Details: endorses B rotator cuff surgeries/injuries (L>R residual deficits related to ROM/pain and neuropathic pain) shoulder flexion AROM limited to ~100*; other joints distally grossly WFL; strength grossly 3+/5 LUE: Shoulder pain with ROM LUE Sensation: WNL LUE Coordination: WNL    Lower Extremity Assessment Lower Extremity Assessment: Generalized weakness (limited R ankle AROM, uses AFO at baseline)    Cervical / Trunk Assessment Cervical  / Trunk Assessment: Normal  Communication   Communication Communication: No apparent difficulties    Cognition Arousal: Alert Behavior During Therapy: WFL for tasks assessed/performed   PT - Cognitive impairments: No apparent impairments    Following commands: Intact       Cueing Cueing Techniques: Verbal cues, Visual cues     General Comments General comments (skin integrity, edema, etc.): VSS on RA     PT Assessment Patient needs continued PT services  PT Problem List Decreased strength;Decreased activity tolerance;Decreased balance;Decreased mobility       PT Treatment Interventions DME instruction;Gait training;Functional mobility training;Therapeutic activities;Therapeutic exercise;Balance training;Neuromuscular re-education;Patient/family education;Wheelchair mobility training    PT Goals (Current goals can be found in the Care Plan section)  Acute Rehab PT Goals Patient Stated Goal: to get more therapy PT Goal Formulation: With patient Time For Goal Achievement: 07/14/24 Potential to Achieve Goals: Good    Frequency Min 2X/week     Co-evaluation PT/OT/SLP Co-Evaluation/Treatment: Yes Reason for Co-Treatment: For patient/therapist safety;To address functional/ADL transfers PT goals addressed during session: Mobility/safety with mobility;Balance OT goals addressed during session: ADL's and self-care       AM-PAC PT 6 Clicks Mobility  Outcome Measure Help needed turning from your back to your side while in a flat bed without using bedrails?: Total Help needed moving from lying on your back to sitting on the side of a flat bed without using bedrails?: Total Help needed moving to and from a bed to a chair (including a wheelchair)?: Total Help needed standing up from a chair using your arms (e.g., wheelchair or bedside chair)?: Total Help needed to walk in hospital room?: Total Help needed climbing 3-5 steps with a railing? :  Total 6 Click Score: 6    End  of Session Equipment Utilized During Treatment: Gait belt Activity Tolerance: Patient tolerated treatment well Patient left: in bed;with call bell/phone within reach;with bed alarm set Nurse Communication: Mobility status PT Visit Diagnosis: Unsteadiness on feet (R26.81);Other abnormalities of gait and mobility (R26.89);Muscle weakness (generalized) (M62.81)    Time: 9157-9086 PT Time Calculation (min) (ACUTE ONLY): 31 min   Charges:   PT Evaluation $PT Eval Moderate Complexity: 1 Mod   PT General Charges $$ ACUTE PT VISIT: 1 Visit       Kate ORN, PT, DPT Secure Chat Preferred  Rehab Office [phone]   Kate BRAVO Wendolyn 06/30/2024, 11:50 AM

## 2024-07-01 DIAGNOSIS — Z96653 Presence of artificial knee joint, bilateral: Secondary | ICD-10-CM | POA: Diagnosis present

## 2024-07-01 DIAGNOSIS — R918 Other nonspecific abnormal finding of lung field: Secondary | ICD-10-CM | POA: Diagnosis not present

## 2024-07-01 DIAGNOSIS — I5032 Chronic diastolic (congestive) heart failure: Secondary | ICD-10-CM | POA: Diagnosis not present

## 2024-07-01 DIAGNOSIS — R600 Localized edema: Secondary | ICD-10-CM | POA: Diagnosis not present

## 2024-07-01 DIAGNOSIS — Z91041 Radiographic dye allergy status: Secondary | ICD-10-CM | POA: Diagnosis not present

## 2024-07-01 DIAGNOSIS — R609 Edema, unspecified: Secondary | ICD-10-CM | POA: Diagnosis not present

## 2024-07-01 DIAGNOSIS — E66813 Obesity, class 3: Secondary | ICD-10-CM | POA: Diagnosis present

## 2024-07-01 DIAGNOSIS — I10 Essential (primary) hypertension: Secondary | ICD-10-CM | POA: Diagnosis not present

## 2024-07-01 DIAGNOSIS — G250 Essential tremor: Secondary | ICD-10-CM | POA: Diagnosis present

## 2024-07-01 DIAGNOSIS — Z818 Family history of other mental and behavioral disorders: Secondary | ICD-10-CM | POA: Diagnosis not present

## 2024-07-01 DIAGNOSIS — Z87891 Personal history of nicotine dependence: Secondary | ICD-10-CM | POA: Diagnosis not present

## 2024-07-01 DIAGNOSIS — Z91013 Allergy to seafood: Secondary | ICD-10-CM | POA: Diagnosis not present

## 2024-07-01 DIAGNOSIS — Z8261 Family history of arthritis: Secondary | ICD-10-CM | POA: Diagnosis not present

## 2024-07-01 DIAGNOSIS — Z881 Allergy status to other antibiotic agents status: Secondary | ICD-10-CM | POA: Diagnosis not present

## 2024-07-01 DIAGNOSIS — E0500 Thyrotoxicosis with diffuse goiter without thyrotoxic crisis or storm: Secondary | ICD-10-CM | POA: Diagnosis present

## 2024-07-01 DIAGNOSIS — G629 Polyneuropathy, unspecified: Secondary | ICD-10-CM | POA: Diagnosis present

## 2024-07-01 DIAGNOSIS — Z90711 Acquired absence of uterus with remaining cervical stump: Secondary | ICD-10-CM | POA: Diagnosis not present

## 2024-07-01 DIAGNOSIS — R531 Weakness: Secondary | ICD-10-CM | POA: Diagnosis present

## 2024-07-01 DIAGNOSIS — J45909 Unspecified asthma, uncomplicated: Secondary | ICD-10-CM | POA: Diagnosis present

## 2024-07-01 DIAGNOSIS — K219 Gastro-esophageal reflux disease without esophagitis: Secondary | ICD-10-CM | POA: Diagnosis present

## 2024-07-01 DIAGNOSIS — F32A Depression, unspecified: Secondary | ICD-10-CM | POA: Diagnosis present

## 2024-07-01 DIAGNOSIS — Z6841 Body Mass Index (BMI) 40.0 and over, adult: Secondary | ICD-10-CM | POA: Diagnosis not present

## 2024-07-01 DIAGNOSIS — Z833 Family history of diabetes mellitus: Secondary | ICD-10-CM | POA: Diagnosis not present

## 2024-07-01 DIAGNOSIS — M797 Fibromyalgia: Secondary | ICD-10-CM | POA: Diagnosis present

## 2024-07-01 DIAGNOSIS — R0602 Shortness of breath: Secondary | ICD-10-CM | POA: Diagnosis not present

## 2024-07-01 DIAGNOSIS — M21371 Foot drop, right foot: Secondary | ICD-10-CM | POA: Diagnosis present

## 2024-07-01 DIAGNOSIS — M1990 Unspecified osteoarthritis, unspecified site: Secondary | ICD-10-CM | POA: Diagnosis present

## 2024-07-01 DIAGNOSIS — J984 Other disorders of lung: Secondary | ICD-10-CM | POA: Diagnosis not present

## 2024-07-01 DIAGNOSIS — R0989 Other specified symptoms and signs involving the circulatory and respiratory systems: Secondary | ICD-10-CM | POA: Diagnosis not present

## 2024-07-01 DIAGNOSIS — M4804 Spinal stenosis, thoracic region: Secondary | ICD-10-CM | POA: Diagnosis not present

## 2024-07-01 DIAGNOSIS — Z8249 Family history of ischemic heart disease and other diseases of the circulatory system: Secondary | ICD-10-CM | POA: Diagnosis not present

## 2024-07-01 DIAGNOSIS — R911 Solitary pulmonary nodule: Secondary | ICD-10-CM | POA: Diagnosis not present

## 2024-07-01 NOTE — Progress Notes (Signed)
 Patient ID: [name], female   DOB: 09-18-1961, 63 y.o.   MRN: [id number] BP 112/68 (BP Location: Right Arm)   Pulse 90   Temp 98.4 F (36.9 C) (Oral)   Resp 20   Ht 5' 2 (1.575 m)   LMP 11/20/2012   SpO2 95%   BMI 40.08 kg/m   Alert, oriented x 4, speech is clear and fluent Moving all extremities Wound vac in place Continues to need PT and OT

## 2024-07-01 NOTE — Care Management Obs Status (Signed)
 MEDICARE OBSERVATION STATUS NOTIFICATION   Patient Details  Name: [name] MRN: [id number] Date of Birth: 29-Jul-1961   Medicare Observation Status Notification Given:  Yes Verbally reviewed observation notice with [name] telephonically at [phone].      [name] 07/01/2024, 4:22 PM

## 2024-07-01 NOTE — Consult Note (Signed)
 WOC Nurse Consult Note: Reason for Consult:  wound VAC   Medical record reviewed and discussed with bedside RN.  RN reports a Prevena NPWT with occlusive dressing lifting, requesting supplies for reinforcement.  WOC team does not have access to supplies, this NPWT is placed in the OR.  Advised bedside RN to contact neurosurgery for management.    WOC team will not consult at this time, please re consult if new needs arise.   Thank you,  Doyal Polite, MSN, RN, [facility] WOC Team [phone] (Available Mon-Fri [phone])

## 2024-07-01 NOTE — Anesthesia Postprocedure Evaluation (Signed)
 Anesthesia Post Note  Patient: [name]  Procedure(s) Performed: THORACIC NINE THORACIC TEN LAMINECTOMY with  prevena plus 125 therapy     Patient location during evaluation: PACU Anesthesia Type: General Level of consciousness: patient cooperative Pain management: pain level controlled Vital Signs Assessment: post-procedure vital signs reviewed and stable Respiratory status: spontaneous breathing, nonlabored ventilation and respiratory function stable Cardiovascular status: blood pressure returned to baseline and stable Postop Assessment: no apparent nausea or vomiting Anesthetic complications: no   No notable events documented.                  Emran Molzahn

## 2024-07-01 NOTE — Progress Notes (Signed)
 Wound vac is not working properly.  Multiple attempts to reinforce it and it is still not suctioning properly.  Camie Pickle, PA-C notified, states Dr, Lindalee will evaluate in the AM.

## 2024-07-01 NOTE — Op Note (Signed)
 06/29/2024  5:05 PM  PATIENT:  [name]  63 y.o. female With recurrent stenosis at T9/10 admitted for thecal sac decompression. PRE-OPERATIVE DIAGNOSIS:  Spinal stenosis, thoracic region, T9/10  POST-OPERATIVE DIAGNOSIS:Same PROCEDURE:  Procedure(s): THORACIC NINE THORACIC TEN LAMINECTOMY and discetomy  Placement antibiotic beads in the disc space and infra fascial spaces. Vancomycin  and gentamicin  Incisional wound vac,  SURGEON:   Surgeon(s): Gillie Duncans, MD Darnella Dorn SAUNDERS, MD  ASSISTANTS:Gartst, Dorn  ANESTHESIA:   general  EBL:  Total I/O In: 563 [P.O.:560; I.V.:3] Out: 800 [Urine:800]  BLOOD ADMINISTERED:none  CELL SAVER GIVEN:not used  COUNT:per nursing  DRAINS: Urinary Catheter (Foley)   SPECIMEN:  Source of Specimen:  disc space  DICTATION: [name] was taken to the operating room, intubated and placed under a general anesthetic without difficulty. She was positioned prone on a Jackson table with all pressure points padded. Her back was prepped and draped in a sterile manner. I opened the skin with a 10 blade and carried the dissection down to the thoracolumbar fascia. I used both sharp dissection and the monopolar cautery to expose the lamina of T10 and the pedicle screws, and T9. I confirmed my location with an intraoperative xray.  I used the drill, Kerrison punches, and curettes to remove the lateral bone still remaining. I drilled down the left pedicle and found the 9/10 disc space. I performed a limited discetomy to remove the hump at the disc space caused by a previous discitis. I did send specimen from the disc space. I used the punches in the disc space along with Epstein curettes to push bone away from the thecal sac.these maneuvers allowed the thecal sac and the spinal canal to be decompressed. I used pituitary rongeurs, curettes, and other instruments to remove disc material. After the discectomy was completed I inspected the epidural space and  the left T9 nerve root and felt it was well decompressed. I explored rostrally, laterally, medially, and caudally and was satisfied with the decompression. I irrigated the wound, then closed in layers. I approximated the thoracolumbar fascia, and subcutaneous planes. I placed antibiotic impregnated beads in the disc space and in  the subfascial plane. I used vicryl sutures to approximated the thoracolumbar fascia and scar. I used an incisional wound vac for a sterile dressing.  She was rolled onto the stretcher, extubated and moving all extremities. PLAN OF CARE: Admit to inpatient   PATIENT DISPOSITION:  PACU - hemodynamically stable.   Delay start of Pharmacological VTE agent (>24hrs) due to surgical blood loss or risk of bleeding:  no

## 2024-07-01 NOTE — Plan of Care (Signed)

## 2024-07-02 NOTE — Plan of Care (Signed)

## 2024-07-02 NOTE — Progress Notes (Signed)
 Physical Therapy Treatment Patient Details Name: [name] MRN: [id number] DOB: 1961-03-26 Today's Date: 07/02/2024   History of Present Illness Pt is a 63 y/o F admitted for thoracic stenosis at T9/10 and LE weakness, now s/p thoracic decompression with neurosurgery on 06/29/24. Of note, pt had T9-10 laminectomy on 01/08/24 and was admitted to [facility] for rehab. PMHx: GERD, anxiety, HTN, fibromyalgia, hyperthyroidism, IBS, ACDF 3/4, posterior lateral arthrodesis 6/16, CSF leak 6/24.    PT Comments  Pt received in supine and agreeable to session. Pt able to verbalize back precautions, but requires cues to adhere functionally. Pt able to stand and tolerate propped sitting in stedy to complete ADLs at the sink for ~20 mins. Pt able to perform weight shifting and attempt marches, but is unable to tolerate RLE WB to lift L foot. Pt continues to benefit from PT services to progress toward functional mobility goals.    If plan is discharge home, recommend the following: A lot of help with walking and/or transfers;A lot of help with bathing/dressing/bathroom;Assist for transportation;Help with stairs or ramp for entrance   Can travel by private vehicle        Equipment Recommendations  Other (comment) (TBD)    Recommendations for Other Services       Precautions / Restrictions Precautions Precautions: Back;Fall Recall of Precautions/Restrictions: Impaired Precaution/Restrictions Comments: R AFO, cues to recall BLT's and log roll technique for bed mobility Restrictions Weight Bearing Restrictions Per Provider Order: No     Mobility  Bed Mobility Overal bed mobility: Needs Assistance Bed Mobility: Rolling, Sidelying to Sit Rolling: Mod assist, +2 for physical assistance Sidelying to sit: Mod assist, +2 for physical assistance       General bed mobility comments: Cues for logroll technique and assist for BLE advancement and trunk elevation    Transfers Overall transfer level: Needs  assistance Equipment used: Ambulation equipment used Transfers: Sit to/from Stand, Bed to chair/wheelchair/BSC Sit to Stand: Mod assist, +2 physical assistance, From elevated surface, Min assist           General transfer comment: STS from EOB with mod A +2 and stedy paddles with min A +2 for power up. Cues for decreased trunk flexion and technique. Transfer via Lift Equipment: Stedy  Ambulation/Gait             Pre-gait activities: weight shifting and RLE Engineer, drilling Bed    Modified Rankin (Stroke Patients Only)       Balance Overall balance assessment: Needs assistance Sitting-balance support: Bilateral upper extremity supported, Feet supported Sitting balance-[name]: Fair Sitting balance - Comments: sitting EOB with intermittent posterior lean, but no LOB   Standing balance support: Bilateral upper extremity supported, During functional activity, Reliant on assistive device for balance Standing balance-[name]: Poor Standing balance comment: reliant on external support                            Communication Communication Communication: No apparent difficulties  Cognition Arousal: Alert Behavior During Therapy: WFL for tasks assessed/performed   PT - Cognitive impairments: No apparent impairments                         Following commands: Intact      Cueing Cueing Techniques: Verbal cues, Visual cues  Exercises  General Comments        Pertinent Vitals/Pain Pain Assessment Pain Assessment: Faces Faces Pain Scale: Hurts little more Pain Location: back Pain Descriptors / Indicators: Grimacing, Guarding Pain Intervention(s): Limited activity within patient's tolerance, Monitored during session, Repositioned     PT Goals (current goals can now be found in the care plan section) Acute Rehab PT Goals Patient Stated Goal: to get more therapy PT Goal  Formulation: With patient Time For Goal Achievement: 07/14/24 Progress towards PT goals: Progressing toward goals    Frequency    Min 2X/week           Co-evaluation PT/OT/SLP Co-Evaluation/Treatment: Yes Reason for Co-Treatment: For patient/therapist safety;To address functional/ADL transfers PT goals addressed during session: Mobility/safety with mobility;Balance;Proper use of DME        AM-PAC PT 6 Clicks Mobility   Outcome Measure  Help needed turning from your back to your side while in a flat bed without using bedrails?: Total Help needed moving from lying on your back to sitting on the side of a flat bed without using bedrails?: Total Help needed moving to and from a bed to a chair (including a wheelchair)?: Total Help needed standing up from a chair using your arms (e.g., wheelchair or bedside chair)?: Total Help needed to walk in hospital room?: Total Help needed climbing 3-5 steps with a railing? : Total 6 Click Score: 6    End of Session Equipment Utilized During Treatment: Gait belt Activity Tolerance: Patient tolerated treatment well Patient left: with call bell/phone within reach;in chair;with chair alarm set Nurse Communication: Mobility status PT Visit Diagnosis: Unsteadiness on feet (R26.81);Other abnormalities of gait and mobility (R26.89);Muscle weakness (generalized) (M62.81)     Time: 8659-8582 PT Time Calculation (min) (ACUTE ONLY): 37 min  Charges:    $Therapeutic Activity: 8-22 mins PT General Charges $$ ACUTE PT VISIT: 1 Visit                     Darryle George, PTA Acute Rehabilitation Services Secure Chat Preferred  [phone]    Darryle George 07/02/2024, 3:00 PM

## 2024-07-02 NOTE — Progress Notes (Signed)
 Inpatient Rehab Admissions Coordinator:   At this time we are recommending a CIR consult and I will place an order per our protocol.   Reche Lowers, PT, DPT Admissions Coordinator [phone] 07/02/24  3:40 PM

## 2024-07-02 NOTE — Progress Notes (Signed)
 Patient ID: [name], female   DOB: 1960-12-07, 63 y.o.   MRN: [id number] BP (!) 145/85 (BP Location: Right Arm)   Pulse 85   Temp 99.1 F (37.3 C) (Oral)   Resp 16   Ht 5' 2 (1.575 m)   LMP 11/20/2012   SpO2 93%   BMI 40.08 kg/m  Alert and oriented x 4, speech is clear and fluent Moving extremities well Wound vac replaced, working well Continues to work with PT, not ready for discharge.

## 2024-07-02 NOTE — Progress Notes (Signed)
 Occupational Therapy Treatment Patient Details Name: [name] MRN: [id number] DOB: [birth year] Today's Date: 07/02/2024   History of present illness Pt is a 63 y/o F admitted for thoracic stenosis at T9/10 and LE weakness, now s/p thoracic decompression with neurosurgery on 06/29/24. Of note, pt had T9-10 laminectomy on 01/08/24 and was admitted to [facility] for rehab. PMHx: GERD, anxiety, HTN, fibromyalgia, hyperthyroidism, IBS, ACDF 3/4, posterior lateral arthrodesis 6/16, CSF leak 6/24.   OT comments  Pt greeted in supine, agreeable and eager to participate with therapy. She was able to progress OOB today via Stedy. Requires mod A +2 for bed mobility and sit<>stand transfers with cueing for safety, hand placement, and maintaining post-op spine precautions. She tolerated various grooming tasks at the sink with min A for completion. Left upright in chair, PTA in room upon OT departure. Pt is progressing well with functional goals; OT to continue to follow.       If plan is discharge home, recommend the following:  Two people to help with walking and/or transfers;Two people to help with bathing/dressing/bathroom;Assist for transportation;Help with stairs or ramp for entrance;Assistance with cooking/housework   Equipment Recommendations  Other (comment)    Recommendations for Other Services Rehab consult    Precautions / Restrictions Precautions Precautions: Back;Fall Precaution Booklet Issued: No Recall of Precautions/Restrictions: Impaired Precaution/Restrictions Comments: cued to recall log roll for bed mobility & BLT restrictions Restrictions Weight Bearing Restrictions Per Provider Order: No       Mobility Bed Mobility Overal bed mobility: Needs Assistance Bed Mobility: Rolling, Supine to Sit Rolling: Mod assist, +2 for physical assistance, Used rails   Supine to sit: Mod assist, +2 for physical assistance, Used rails     General bed mobility comments: Cued through log roll  technique, exited to the L side of the bed    Transfers Overall transfer level: Needs assistance Equipment used: Ambulation equipment used Transfers: Sit to/from Stand Sit to Stand: Mod assist, +2 physical assistance, From elevated surface           General transfer comment: STS from elevated bed with cues for hand placement, sequencing, and powering up. Transfer via Lift Equipment: Stedy   Balance Overall balance assessment: Needs assistance Sitting-balance support: Bilateral upper extremity supported, Feet supported Sitting balance-[name]: Fair Sitting balance - Comments: intermittent posterior lean with BUE removed from bed while EOB w/o back support   Standing balance support: Bilateral upper extremity supported Standing balance-[name]: Poor Standing balance comment: reliant on stedy bars for UE support                           ADL either performed or assessed with clinical judgement   ADL Overall ADL's : Needs assistance/impaired     Grooming: Oral care;Wash/dry face;Applying deodorant;Sitting;Standing;Contact guard assist;Minimal assistance Grooming Details (indicate cue type and reason): performed grooming tasks while in stedy at the sink, needs up to CGA to sustain sitting balance while applying deodorant d/t heavy reliance on BUE for support at EOB; cued for postural positioning at the sink to maintain post-op spine precautions         Upper Body Dressing : Contact guard assist;Sitting Upper Body Dressing Details (indicate cue type and reason): inc time/effort for gown exchange, heavily reliant on BUE for support at EOB Lower Body Dressing: Total assistance;Bed level Lower Body Dressing Details (indicate cue type and reason): adjusted B socks  Extremity/Trunk Assessment              Vision       Restaurant manager, fast food Communication: No apparent difficulties   Cognition  Arousal: Alert Behavior During Therapy: WFL for tasks assessed/performed               OT - Cognition Comments: very talkative & tangential, often requiring redirection to task at hand                 Following commands: Intact        Cueing   Cueing Techniques: Verbal cues, Visual cues  Exercises      Shoulder Instructions       General Comments wound vac coming off of pt's incision - RN to room to apply temporary dressing before re-applying wound vac dressing    Pertinent Vitals/ Pain       Pain Assessment Pain Assessment: Faces Faces Pain Scale: Hurts little more Pain Location: back Pain Intervention(s): Limited activity within patient's tolerance, Monitored during session, Premedicated before session  Home Living                                          Prior Functioning/Environment              Frequency  Min 2X/week        Progress Toward Goals  OT Goals(current goals can now be found in the care plan section)  Progress towards OT goals: Progressing toward goals     Plan      Co-evaluation    PT/OT/SLP Co-Evaluation/Treatment: Yes Reason for Co-Treatment: For patient/therapist safety;To address functional/ADL transfers PT goals addressed during session: Mobility/safety with mobility;Balance;Proper use of DME OT goals addressed during session: ADL's and self-care      AM-PAC OT 6 Clicks Daily Activity     Outcome Measure   Help from another person eating meals?: A Little Help from another person taking care of personal grooming?: A Little Help from another person toileting, which includes using toliet, bedpan, or urinal?: Total Help from another person bathing (including washing, rinsing, drying)?: A Lot Help from another person to put on and taking off regular upper body clothing?: A Little Help from another person to put on and taking off regular lower body clothing?: Total 6 Click Score: 13    End of  Session Equipment Utilized During Treatment: Gait belt  OT Visit Diagnosis: Unsteadiness on feet (R26.81);Muscle weakness (generalized) (M62.81);Pain   Activity Tolerance Patient tolerated treatment well   Patient Left in chair;with call bell/phone within reach;with chair alarm set (PTA present in room with pt at end of session)   Nurse Communication Mobility status;Need for lift equipment        Time: 1334-1416 OT Time Calculation (min): 42 min  Charges: OT General Charges $OT Visit: 1 Visit OT Treatments $Self Care/Home Management : 23-37 mins  Tymarion Everard D., MSOT, OTR/L Acute Rehabilitation Services [phone] Secure Chat Preferred  Rikki Milch 07/02/2024, 3:48 PM

## 2024-07-03 NOTE — Plan of Care (Signed)

## 2024-07-03 NOTE — Progress Notes (Signed)
 Inpatient Rehab Admissions Coordinator:    I met with Pt. To discuss potential CIR admit. She is interested and states her family can rotate to assist. I will send case to insurance.   Leita Kleine, MS, CCC-SLP Rehab Admissions Coordinator  [phone] (celll) [phone] (office)

## 2024-07-03 NOTE — Progress Notes (Signed)
 NEUROSURGERY PROGRESS NOTE  Doing well. Complains of appropriate back soreness. No leg pain No numbness, tingling or weakness Ambulating and voiding well Right leg is a little weaker than left but this is chronic and unchanged Incision CDI  Temp:  [98.2 F (36.8 C)-99.2 F (37.3 C)] 98.2 F (36.8 C) (09/27 0619) Pulse Rate:  [85-110] 88 (09/27 0332) Resp:  [16-20] 20 (09/27 0332) BP: (110-166)/(61-96) 110/61 (09/27 0332) SpO2:  [87 %-100 %] 95 % (09/27 0332)  Plan: Continue therapy and pain managemnet.   Suzen Chiquita Pean, NP 07/03/2024 7:07 AM

## 2024-07-04 LAB — AEROBIC/ANAEROBIC CULTURE W GRAM STAIN (SURGICAL/DEEP WOUND)
Culture: NO GROWTH
Gram Stain: NONE SEEN

## 2024-07-04 NOTE — Progress Notes (Signed)
 NEUROSURGERY PROGRESS NOTE  Doing well. Having a some back pain but tolerable. Did not get out of bed yesterday. Would like her to try and ambulate today. Continue wound vac.  Temp:  [98 F (36.7 C)-100.2 F (37.9 C)] 98 F (36.7 C) (09/28 0812) Pulse Rate:  [77-100] 77 (09/28 0812) Resp:  [16-19] 17 (09/28 0812) BP: (106-152)/(62-85) 119/73 (09/28 0812) SpO2:  [89 %-96 %] 96 % (09/28 0812)   Suzen Chiquita Pean, NP 07/04/2024 8:58 AM

## 2024-07-04 NOTE — PMR Pre-admission (Shared)
 PMR Admission Coordinator Pre-Admission Assessment  Patient: [name] is an 63 y.o., female MRN: [id number] DOB: January 03, 1961 Height: 5' 2 (157.5 cm) Weight:    Insurance Information HMO:  yes   PPO:      PCP:      IPA:      80/20:      OTHER:  PRIMARY: [employer] Dual Complete      Policy#:  [id number]          Subscriber: *** CM Name: ***      Phone#: ***     Fax#: *** Pre-Cert#: ***      Employer: *** Benefits:  Phone #: ***     Name: *** Eustacio. Date: ***     Deduct: ***      Out of Pocket Max: ***      Life Max: *** CIR: ***      SNF: *** Outpatient: ***     Co-Pay: *** Home Health: ***      Co-Pay: *** DME: ***     Co-Pay: *** Providers: *** SECONDARY: Medicaid of [place]      Policy#: [id number]      Phone#: ***  Financial Counselor:       Phone#:   The "Data Collection Information Summary" for patients in Inpatient Rehabilitation Facilities with attached "Privacy Act Statement-Health Care Records" was provided and verbally reviewed with: Patient  Emergency Contact Information Contact Information     Name Relation Home Work Mobile   [place] Son   [phone]   Wonda Charlies Ahumada [phone]  [phone]      Other Contacts     Name Relation Home Work Mobile   [place] Sister [phone]  [phone]       Current Medical History  Patient Admitting Diagnosis: Thoracic Myelopathy History of Present Illness: [name] is a 63 y.o. female with past medical history of GERD, anxiety, HTN, fibromyalgia, hyperthyroidism, IBS, ACDF 3/4, posterior lateral arthrodesis 6/16, CSF leak 6/24 who presented to [facility] service for T9/10 decompression . She initially had a T9-10 laminectomy on 01/08/24 and initially did well, but developed LE weakness once home from rehab. Dr. Gillie performed her surgery. Pt. Was seen by PT/OT post operatively and they recommend CIR to assist return to PLOF.    Patient's medical record  from [facility] has been reviewed by the rehabilitation admission coordinator and physician.  Past Medical History  Past Medical History:  Diagnosis Date   Anemia    Ankle syndesmosis disruption    Anxiety    takes Ativan  and Valium , after mother passed   Arthritis    bilateral knees s/p knee replacement bilaterally   Asthma    Bronchitis    Bruising    pt states unexplained d/t fibromyalgia   Chronic back pain    2012 tailbone surgery and 3 lower discs.    Closed fracture of distal end of right fibula and tibia    Depression    from Fibromyalgia diagnosis; not taking medicine. since 2001   Dizziness    rarely   Fibromyalgia    diagnosed 2001   GERD (gastroesophageal reflux disease)    Prilosec occasionally   Headache(784.0)    sinus headaches   History of hiatal hernia    Hypertension    since 2013   Hyperthyroidism    subclinical, no treatment; thyroid  nodules   IBS (irritable bowel syndrome)    Impaired memory    states from fibromyalgia  Insomnia    takes Ambien    Jones fracture    left foot fifth metatarsal   Multiple allergies    including latex, pet dander, shellfish, pet dander   Painful orthopaedic hardware right ankle 05/27/2016   Seasonal allergies    Shortness of breath    Occasional with exertion;    Sleep apnea    Sore gums    this is why pt is on Amoxil-only takes for dental work   Tachycardia    Thyroid  goiter    Varicose vein    protrudes above skin-per pt;vein popped and bruised;ultrasound done to make sure that there were no clots;noclots were found    Has the patient had major surgery during 100 days prior to admission? Yes  Family History   family history includes Alcohol  abuse in her father; Arthritis in her sister; Depression in her sister; Diabetes in an other family member; Hypertension in her sister; Pulmonary embolism in her mother; Stroke in her father.  Current Medications  Current Facility-Administered  Medications:    0.9 % NaCl with KCl 20 mEq/ L  infusion, , Intravenous, Continuous, Cabbell, Kyle, MD, Stopped at 07/01/24 2355   acetaminophen  (TYLENOL ) tablet 650 mg, 650 mg, Oral, Q4H PRN, 650 mg at 07/04/24 0730 **OR** acetaminophen  (TYLENOL ) suppository 650 mg, 650 mg, Rectal, Q4H PRN, Cabbell, Kyle, MD   albuterol  (PROVENTIL ) (2.5 MG/3ML) 0.083% nebulizer solution 3 mL, 3 mL, Inhalation, Q6H PRN, Cabbell, Kyle, MD, 3 mL at 06/30/24 0121   ascorbic acid  (VITAMIN C ) tablet 500 mg, 500 mg, Oral, Daily, Cabbell, Kyle, MD, 500 mg at 07/04/24 9182   aspirin  EC tablet 81 mg, 81 mg, Oral, Daily, Cabbell, Kyle, MD, 81 mg at 07/04/24 0818   atenolol  (TENORMIN ) tablet 50 mg, 50 mg, Oral, Daily, Cabbell, Kyle, MD, 50 mg at 07/04/24 0818   bisacodyl  (DULCOLAX) EC tablet 5 mg, 5 mg, Oral, Daily PRN, Cabbell, Kyle, MD   celecoxib  (CELEBREX ) capsule 200 mg, 200 mg, Oral, BID, Cabbell, Kyle, MD, 200 mg at 07/04/24 9180   chlorzoxazone  (PARAFON ) tablet 500 mg, 500 mg, Oral, TID, Cabbell, Kyle, MD, 500 mg at 07/04/24 9180   cholecalciferol  (VITAMIN D3) 25 MCG (1000 UNIT) tablet, , Oral, Daily, Cabbell, Kyle, MD, 1,000 Units at 07/04/24 9180   cyanocobalamin  (VITAMIN B12) tablet 500 mcg, 500 mcg, Oral, Daily, Cabbell, Kyle, MD, 500 mcg at 07/04/24 0818   diazepam  (VALIUM ) tablet 5 mg, 5 mg, Oral, Q12H PRN, Cabbell, Kyle, MD, 5 mg at 07/04/24 1212   diphenhydrAMINE  (BENADRYL ) capsule 25 mg, 25 mg, Oral, Q8H PRN, Cabbell, Kyle, MD, 25 mg at 07/03/24 2103   doxycycline  (VIBRA -TABS) tablet 100 mg, 100 mg, Oral, BID, Cabbell, Kyle, MD, 100 mg at 07/04/24 9182   gabapentin  (NEURONTIN ) capsule 1,200 mg, 1,200 mg, Oral, TID, Cabbell, Kyle, MD, 1,200 mg at 07/04/24 9180   hydrochlorothiazide  (HYDRODIURIL ) tablet 25 mg, 25 mg, Oral, q AM, Cabbell, Kyle, MD, 25 mg at 07/04/24 0818   influenza vac split trivalent PF (FLUZONE ) injection 0.5 mL, 0.5 mL, Intramuscular, Tomorrow-1000, Cabbell, Kyle, MD   lubiprostone  (AMITIZA )  capsule 48 mcg, 48 mcg, Oral, Q lunch, Cabbell, Kyle, MD, 48 mcg at 07/04/24 1212   magnesium  citrate solution 1 Bottle, 1 Bottle, Oral, Once PRN, Cabbell, Kyle, MD   menthol  (CEPACOL) lozenge 3 mg, 1 lozenge, Oral, PRN **OR** phenol (CHLORASEPTIC) mouth spray 1 spray, 1 spray, Mouth/Throat, PRN, Cabbell, Kyle, MD   morphine  (PF) 2 MG/ML injection 2 mg, 2 mg, Intravenous, Q2H PRN,  Cabbell, Kyle, MD, 2 mg at 07/02/24 2201   multivitamin with minerals tablet 1 tablet, 1 tablet, Oral, q AM, Cabbell, Kyle, MD, 1 tablet at 07/04/24 9182   naloxone  (NARCAN ) nasal spray 4 mg/0.1 mL, 1 spray, Nasal, Once PRN, Cabbell, Kyle, MD   ondansetron  (ZOFRAN ) tablet 4 mg, 4 mg, Oral, Q6H PRN **OR** ondansetron  (ZOFRAN ) injection 4 mg, 4 mg, Intravenous, Q6H PRN, Cabbell, Kyle, MD   oxyCODONE  (Oxy IR/ROXICODONE ) immediate release tablet 10 mg, 10 mg, Oral, Q3H PRN, Cabbell, Kyle, MD, 10 mg at 07/03/24 2243   oxyCODONE  (Oxy IR/ROXICODONE ) immediate release tablet 5 mg, 5 mg, Oral, Q3H PRN, Cabbell, Kyle, MD   oxyCODONE  (OXYCONTIN ) 12 hr tablet 15 mg, 15 mg, Oral, Q12H, Cabbell, Rockey, MD, 15 mg at 07/04/24 9180   pantoprazole  (PROTONIX ) EC tablet 40 mg, 40 mg, Oral, Daily, Cabbell, Kyle, MD, 40 mg at 07/04/24 9180   senna (SENOKOT) tablet 8.6 mg, 1 tablet, Oral, BID, Cabbell, Kyle, MD, 8.6 mg at 07/04/24 0819   senna-docusate (Senokot-S) tablet 1 tablet, 1 tablet, Oral, QHS PRN, Cabbell, Kyle, MD   sodium chloride  flush (NS) 0.9 % injection 3 mL, 3 mL, Intravenous, Q12H, Cabbell, Kyle, MD, 3 mL at 07/04/24 0820   sodium chloride  flush (NS) 0.9 % injection 3 mL, 3 mL, Intravenous, PRN, Cabbell, Kyle, MD  Patients Current Diet:  Diet Order             Diet regular Room service appropriate? Yes; Fluid consistency: Thin  Diet effective now                   Precautions / Restrictions Precautions Precautions: Back, Fall Precaution Booklet Issued: No Precaution/Restrictions Comments: R AFO, cues to recall  BLT's and log roll technique for bed mobility Restrictions Weight Bearing Restrictions Per Provider Order: No   Has the patient had 2 or more falls or a fall with injury in the past year? Yes  Prior Activity Level Community (5-7x/wk): Pt. active in the community PTA  Prior Functional Level Self Care: Did the patient need help bathing, dressing, using the toilet or eating? Independent  Indoor Mobility: Did the patient need assistance with walking from room to room (with or without device)? Independent  Stairs: Did the patient need assistance with internal or external stairs (with or without device)? Independent  Functional Cognition: Did the patient need help planning regular tasks such as shopping or remembering to take medications? Independent  Patient Information Are you of Hispanic, Latino/a,or Spanish origin?: A. No, not of Hispanic, Latino/a, or Spanish origin What is your race?: B. Black or African American Do you need or want an interpreter to communicate with a doctor or health care staff?: 0. No  Patient's Response To:  Health Literacy and Transportation Is the patient able to respond to health literacy and transportation needs?: Yes Health Literacy - How often do you need to have someone help you when you read instructions, pamphlets, or other written material from your doctor or pharmacy?: Never In the past 12 months, has lack of transportation kept you from medical appointments or from getting medications?: No In the past 12 months, has lack of transportation kept you from meetings, work, or from getting things needed for daily living?: No  Home Assistive Devices / Equipment Home Equipment: Wheelchair - manual (upright rollator, w/c with removable arm rests & leg rests, bed pan)  Prior Device Use: Indicate devices/aids used by the patient prior to current illness, exacerbation or injury? Manual  wheelchair and Environmental consultant  Current Functional Level Cognition  Orientation  Level: Oriented X4    Extremity Assessment (includes Sensation/Coordination)  Upper Extremity Assessment: Defer to OT evaluation RUE: Shoulder pain with ROM (limited shoulder flexion at end range) RUE Sensation: WNL RUE Coordination: WNL LUE Deficits / Details: endorses B rotator cuff surgeries/injuries (L>R residual deficits related to ROM/pain and neuropathic pain) shoulder flexion AROM limited to ~100*; other joints distally grossly WFL; strength grossly 3+/5 LUE: Shoulder pain with ROM LUE Sensation: WNL LUE Coordination: WNL  Lower Extremity Assessment: Generalized weakness (limited R ankle AROM, uses AFO at baseline)    ADLs  Overall ADL's : Needs assistance/impaired Eating/Feeding: Set up, Bed level Eating/Feeding Details (indicate cue type and reason): pt denied difficulty feeding self; observation/simulation bringing hand to mouth Grooming: Oral care, Wash/dry face, Applying deodorant, Sitting, Standing, Contact guard assist, Minimal assistance Grooming Details (indicate cue type and reason): performed grooming tasks while in stedy at the sink, needs up to CGA to sustain sitting balance while applying deodorant d/t heavy reliance on BUE for support at EOB; cued for postural positioning at the sink to maintain post-op spine precautions Upper Body Dressing : Contact guard assist, Sitting Upper Body Dressing Details (indicate cue type and reason): inc time/effort for gown exchange, heavily reliant on BUE for support at EOB Lower Body Dressing: Total assistance, Bed level Lower Body Dressing Details (indicate cue type and reason): adjusted B socks Toileting- Clothing Manipulation and Hygiene: Total assistance Toileting - Clothing Manipulation Details (indicate cue type and reason): anticipated due to functional deficits; purewick intact    Mobility  Overal bed mobility: Needs Assistance Bed Mobility: Rolling, Sidelying to Sit Rolling: Mod assist, +2 for physical  assistance Sidelying to sit: Mod assist, +2 for physical assistance Supine to sit: Mod assist, +2 for physical assistance, Used rails Sit to supine: Max assist, +2 for physical assistance, HOB elevated, Used rails General bed mobility comments: Cues for logroll technique and assist for BLE advancement and trunk elevation    Transfers  Overall transfer level: Needs assistance Equipment used: Ambulation equipment used Transfers: Sit to/from Stand, Bed to chair/wheelchair/BSC Sit to Stand: Mod assist, +2 physical assistance, From elevated surface, Min assist Bed to/from chair/wheelchair/BSC transfer type:: Via Lift equipment Transfer via Lift Equipment: Stedy General transfer comment: STS from EOB with mod A +2 and stedy paddles with min A +2 for power up. Cues for decreased trunk flexion and technique.    Ambulation / Gait / Stairs / Wheelchair Mobility  Ambulation/Gait Pre-gait activities: weight shifting and RLE marching    Posture / Balance Dynamic Sitting Balance Sitting balance - Comments: sitting EOB with intermittent posterior lean, but no LOB Balance Overall balance assessment: Needs assistance Sitting-balance support: Bilateral upper extremity supported, Feet supported Sitting balance-[name]: Fair Sitting balance - Comments: sitting EOB with intermittent posterior lean, but no LOB Postural control: Posterior lean Standing balance support: Bilateral upper extremity supported, During functional activity, Reliant on assistive device for balance Standing balance-[name]: Poor Standing balance comment: reliant on external support    Special considerations/life events  Skin *** and Special service needs ***   Previous Home Environment (from acute therapy documentation) Living Arrangements: Other relatives  Lives With: Alone Available Help at Discharge: Family, Friend(s) Type of Home: House Home Layout: One level Home Access: Ramped entrance Bathroom Shower/Tub:  Engineer, manufacturing systems: Standard Bathroom Accessibility: Yes How Accessible: Accessible via walker Home Care Services: No Additional Comments: aunt assists her getting out of her  home  Discharge Living Setting Plans for Discharge Living Setting: Patient's home Type of Home at Discharge: House Discharge Home Layout: One level Discharge Home Access: Ramped entrance Discharge Bathroom Shower/Tub: Tub/shower unit Discharge Bathroom Toilet: Standard Discharge Bathroom Accessibility: Yes How Accessible: Accessible via walker Does the patient have any problems obtaining your medications?: No  Social/Family/Support Systems Patient Roles: Other (Comment) Contact Information: [phone] Anticipated Caregiver: Sister Charlies Ability/Limitations of Caregiver: Family can provide min A Caregiver Availability: 24/7 Discharge Plan Discussed with Primary Caregiver: Yes Is Caregiver In Agreement with Plan?: Yes  Goals Patient/Family Goal for Rehab: PT/OT Min A to CGA Expected length of stay: 14-16 days Pt/Family Agrees to Admission and willing to participate: Yes Program Orientation Provided & Reviewed with Pt/Caregiver Including Roles  & Responsibilities: Yes  Decrease burden of Care through IP rehab admission: Not anticipated  Possible need for SNF placement upon discharge: Not anticipated  Patient Condition: I have reviewed medical records from [facility], spoken with CM, and patient and family member. I met with patient at the bedside for inpatient rehabilitation assessment.  Patient will benefit from ongoing PT and OT, can actively participate in 3 hours of therapy a day 5 days of the week, and can make measurable gains during the admission.  Patient will also benefit from the coordinated team approach during an Inpatient Acute Rehabilitation admission.  The patient will receive intensive therapy as well as Rehabilitation physician, nursing, social worker, and care  management interventions.  Due to safety, skin/wound care, disease management, medication administration, pain management, and patient education the patient requires 24 hour a day rehabilitation nursing.  The patient is currently *** with mobility and basic ADLs.  Discharge setting and therapy post discharge at home with home health is anticipated.  Patient has agreed to participate in the Acute Inpatient Rehabilitation Program and will admit {Time; today/tomorrow:10263}.  Preadmission Screen Completed By:  Leita KATHEE Kleine, 07/04/2024 2:39 PM ______________________________________________________________________   Discussed status with Dr. PIERRETTE on *** at *** and received approval for admission today.  Admission Coordinator:  Leita KATHEE Kleine, CCC-SLP, time PIERRETTEPattricia ***   Assessment/Plan: Diagnosis: *** Does the need for close, 24 hr/day Medical supervision in concert with the patient's rehab needs make it unreasonable for this patient to be served in a less intensive setting? {yes_no_potentially:3041433} Co-Morbidities requiring supervision/potential complications: *** Due to {due un:6958565}, does the patient require 24 hr/day rehab nursing? {yes_no_potentially:3041433} Does the patient require coordinated care of a physician, rehab nurse, PT, OT, and SLP to address physical and functional deficits in the context of the above medical diagnosis(es)? {yes_no_potentially:3041433} Addressing deficits in the following areas: {deficits:3041436} Can the patient actively participate in an intensive therapy program of at least 3 hrs of therapy 5 days a week? {yes_no_potentially:3041433} The potential for patient to make measurable gains while on inpatient rehab is {potential:3041437} Anticipated functional outcomes upon discharge from inpatient rehab: {functional outcomes:304600100} PT, {functional outcomes:304600100} OT, {functional outcomes:304600100} SLP Estimated rehab length of stay to reach the above  functional goals is: *** Anticipated discharge destination: {anticipated dc setting:21604} 10. Overall Rehab/Functional Prognosis: {potential:3041437}   MD Signature: ***

## 2024-07-04 NOTE — Plan of Care (Signed)

## 2024-07-05 NOTE — Telephone Encounter (Signed)
 Patient wanted you to know her legs are still swelling and wants to know the next steeps. Please call and advice. Thanks.

## 2024-07-05 NOTE — Plan of Care (Signed)
 OOB to chair with therapy. Wound vac in place. Meds as ordered.    Problem: Education: Goal: Knowledge of General Education information will improve Description: Including pain rating scale, medication(s)/side effects and non-pharmacologic comfort measures Outcome: Progressing   Problem: Activity: Goal: Risk for activity intolerance will decrease Outcome: Progressing   Problem: Skin Integrity: Goal: Risk for impaired skin integrity will decrease Outcome: Progressing   Problem: Safety: Goal: Ability to remain free from injury will improve Outcome: Progressing

## 2024-07-05 NOTE — Progress Notes (Signed)
 Patient ID: Cathryne LILLETTE Masters, female   DOB: 09-05-1961, 63 y.o.   MRN: [id number] BP (!) 153/75 (BP Location: Left Arm)   Pulse 89   Temp 99.1 F (37.3 C) (Oral)   Resp 18   Ht 5' 2 (1.575 m)   LMP 11/20/2012   SpO2 97%   BMI 40.08 kg/m  Alert and oriented x 4 Moving extremities, lower extremities poorly Mrs. Grismore does have care that can be there for 24hrs once discharged from CIR if that is needed.  Doing well

## 2024-07-05 NOTE — Progress Notes (Signed)
 Inpatient Rehab Admissions Coordinator:    I spoke with pt.'s son, sister, and aunt this AM. They all state that they work and cannot provide 24/7 assist after CIR, which she will need. Unless Pt. Is able to identify other sources of support, Pt. Is not a candidate for CIR. I reached out to [facility] to discuss SNF with Pt.   Jill Kleine, MS, CCC-SLP Rehab Admissions Coordinator  [phone] (celll) [phone] (office)

## 2024-07-05 NOTE — Progress Notes (Signed)
 Physical Therapy Treatment Patient Details Name: [name] MRN: [id number] DOB: [birth year] Today's Date: 07/05/2024   History of Present Illness Pt is a 63 y/o F admitted for thoracic stenosis at T9/10 and LE weakness, now s/p thoracic decompression with neurosurgery on 06/29/24. Of note, pt had T9-10 laminectomy on 01/08/24 and was admitted to [facility] for rehab. PMHx: GERD, anxiety, HTN, fibromyalgia, hyperthyroidism, IBS, ACDF 3/4, posterior lateral arthrodesis 6/16, CSF leak 6/24.    PT Comments  Pt received in supine and agreeable to session. Pt reports frustrations with discharge planning and motivation to improve functional independence. Pt able to perform bed mobility and transfers with mod A due to pain and weakness. Pt demonstrates limited standing tolerance and is unable to tolerate marches due to RLE pain. Pt able to participate in weight shifting with stedy support. Pt continues to benefit from PT services to progress toward functional mobility goals.    If plan is discharge home, recommend the following: A lot of help with walking and/or transfers;A lot of help with bathing/dressing/bathroom;Assist for transportation;Help with stairs or ramp for entrance   Can travel by private vehicle        Equipment Recommendations  Other (comment) (TBD)    Recommendations for Other Services       Precautions / Restrictions Precautions Precautions: Back;Fall Precaution Booklet Issued: No Precaution/Restrictions Comments: R AFO, cues to recall BLT's and log roll technique for bed mobility Restrictions Weight Bearing Restrictions Per Provider Order: No     Mobility  Bed Mobility Overal bed mobility: Needs Assistance Bed Mobility: Rolling, Sidelying to Sit Rolling: Mod assist Sidelying to sit: Mod assist       General bed mobility comments: Cues for logroll technique and assist for BLE advancement and trunk elevation    Transfers Overall transfer level: Needs  assistance Equipment used: Ambulation equipment used Transfers: Sit to/from Stand, Bed to chair/wheelchair/BSC Sit to Stand: Mod assist, From elevated surface, Via lift equipment           General transfer comment: STS from EOB with mod A with increased assist for initial boost and pt able to extend hips. Dependent transfer to recliner via stedy Transfer via Lift Equipment: Stedy  Ambulation/Gait             Pre-gait activities: weight shifting General Gait Details: unable   Optometrist     Tilt Bed    Modified Rankin (Stroke Patients Only)       Balance Overall balance assessment: Needs assistance Sitting-balance support: Bilateral upper extremity supported, Feet supported Sitting balance-[name]: Fair Sitting balance - Comments: sitting EOB with CGA   Standing balance support: Bilateral upper extremity supported, During functional activity, Reliant on assistive device for balance Standing balance-[name]: Poor Standing balance comment: reliant on stedy support                            Communication Communication Communication: No apparent difficulties  Cognition Arousal: Alert Behavior During Therapy: WFL for tasks assessed/performed   PT - Cognitive impairments: No apparent impairments                         Following commands: Intact      Cueing Cueing Techniques: Verbal cues, Visual cues  Exercises      General Comments  Pertinent Vitals/Pain Pain Assessment Pain Assessment: Faces Faces Pain Scale: Hurts even more Pain Location: RLE Pain Descriptors / Indicators: Grimacing, Guarding Pain Intervention(s): Monitored during session, Limited activity within patient's tolerance, Repositioned     PT Goals (current goals can now be found in the care plan section) Acute Rehab PT Goals Patient Stated Goal: to get more therapy PT Goal Formulation: With patient Time For Goal  Achievement: 07/14/24 Progress towards PT goals: Progressing toward goals    Frequency    Min 2X/week       AM-PAC PT 6 Clicks Mobility   Outcome Measure  Help needed turning from your back to your side while in a flat bed without using bedrails?: A Lot Help needed moving from lying on your back to sitting on the side of a flat bed without using bedrails?: A Lot Help needed moving to and from a bed to a chair (including a wheelchair)?: Total Help needed standing up from a chair using your arms (e.g., wheelchair or bedside chair)?: A Lot Help needed to walk in hospital room?: Total Help needed climbing 3-5 steps with a railing? : Total 6 Click Score: 9    End of Session Equipment Utilized During Treatment: Gait belt Activity Tolerance: Patient tolerated treatment well;Patient limited by pain Patient left: with call bell/phone within reach;in chair (chair alarm box not working) Nurse Communication: Mobility status;Need for lift equipment PT Visit Diagnosis: Unsteadiness on feet (R26.81);Other abnormalities of gait and mobility (R26.89);Muscle weakness (generalized) (M62.81)     Time: 8593-8552 PT Time Calculation (min) (ACUTE ONLY): 41 min  Charges:    $Therapeutic Activity: 38-52 mins PT General Charges $$ ACUTE PT VISIT: 1 Visit                    Darryle George, PTA Acute Rehabilitation Services Secure Chat Preferred  [phone]   Darryle George 07/05/2024, 3:33 PM

## 2024-07-06 MED ORDER — CYCLOBENZAPRINE HCL 10 MG PO TABS
10.0000 mg | ORAL_TABLET | Freq: Three times a day (TID) | ORAL | Status: DC
  Administered 2024-07-06 – 2024-09-05 (×182): 10 mg via ORAL
  Filled 2024-07-06 (×184): qty 1

## 2024-07-06 MED ORDER — HYDROCHLOROTHIAZIDE 25 MG PO TABS
50.0000 mg | ORAL_TABLET | Freq: Every morning | ORAL | Status: DC
Start: 2024-07-07 — End: 2024-07-21
  Administered 2024-07-07 – 2024-07-21 (×14): 50 mg via ORAL
  Filled 2024-07-06 (×15): qty 2

## 2024-07-06 NOTE — Progress Notes (Signed)
 Bedside report received and care assumed. Care order to elevate legs completed. No acute distress at this time.

## 2024-07-06 NOTE — TOC Progression Note (Signed)
 Transition of Care [facility]) - Progression Note    Patient Details  Name: [name] MRN: [id number] Date of Birth: 1961-08-22  Transition of Care [facility]) CM/SW Contact  [name], [name] Phone Number: 07/06/2024, 4:15 PM  Clinical Narrative:   CSW updated by [facility] liaison that patient said she had arranged 24/7 assistance for CIR admission, would prefer CIR as first choice. Information relayed to CIR admissions, but per CIR admissions the patient does not have support arranged. Patient's insurance also denied CIR at this time.   CSW met with patient to discuss, and she said she has a friend who will stay with her for support. Information relayed back to CIR Admissions. CSW discussed other options, and patient would prefer to attempt an appeal before agreeing to SNF options. If CIR will not appeal, then [facility] will appeal, and patient is in agreement with that. If the appeal is denied, then the patient would agree to SNF as long as it's not [facility] or [facility]. CSW to follow.    Expected Discharge Plan: IP Rehab Facility Barriers to Discharge: Continued Medical Work up, Air traffic controller and Services   Discharge Planning Services: CM Consult Post Acute Care Choice: IP Rehab Living arrangements for the past 2 months: Single Family Home                                       Social Drivers of Health (SDOH) Interventions SDOH Screenings   Food Insecurity: No Food Insecurity (06/30/2024)  Housing: Low Risk  (06/30/2024)  Transportation Needs: No Transportation Needs (06/30/2024)  Utilities: Not At Risk (06/30/2024)  Depression (PHQ2-9): Low Risk  (01/12/2024)  Tobacco Use: Medium Risk (06/29/2024)    Readmission Risk Interventions    05/17/2022    3:02 PM  Readmission Risk Prevention Plan  Transportation Screening Complete  PCP or Specialist Appt within 5-7 Days Complete  Home Care Screening Complete   Medication Review (RN CM) Complete

## 2024-07-06 NOTE — Care Management Important Message (Signed)
 Important Message  Patient Details  Name: ARIBELLA VAVRA MRN: [id number] Date of Birth: 1961/06/01   Important Message Given:  Yes - Medicare IM     Claretta Deed 07/06/2024, 3:57 PM

## 2024-07-06 NOTE — Progress Notes (Signed)
 Inpatient Rehab Admissions Coordinator:    Pt.'s case for CIR denied. She has not worked out a Higher education careers adviser for 24/7 support yet, so I will not file an appeal.   Leita Kleine, MS, CCC-SLP Rehab Admissions Coordinator  [phone] (celll) [phone] (office)

## 2024-07-06 NOTE — Progress Notes (Signed)
 Patient ID: [name], female   DOB: 26-Aug-1961, 63 y.o.   MRN: [id number] BP 136/79 (BP Location: Left Arm)   Pulse 96   Temp 99.1 F (37.3 C) (Oral)   Resp 18   Ht 5' 2 (1.575 m)   LMP 11/20/2012   SpO2 (!) 88%   BMI 40.08 kg/m  Alert and oriented. Waiting on placement for discharge. Wound vac in place.  Continues with pt and ot Is on her diuretic stating that she is edematous in lower extremities. Will order elevation.  Order Bmet, and increase dose of hydrochlorothiazide 

## 2024-07-06 NOTE — TOC Progression Note (Signed)
 Transition of Care [facility]) - Progression Note    Patient Details  Name: [name] MRN: [id number] Date of Birth: Jun 07, 1961  Transition of Care [facility]) CM/SW Contact  [name], [name] Phone Number: 07/06/2024, 4:14 PM  Clinical Narrative:   CSW updated by Rehab Admissions that patient not a candidate for CIR due to lack of support. CSW spoke with patient about other options, she is really not interested in SNF and wants inpatient rehab. CSW discussed with patient about sending referral to [facility], patient in agreement and requested to speak with liaison. CSW sent referral, they can offer if patient is interested. Patient also said she was working on arranging 24/7 assistance, as well. CSW also spoke with patient's son, Kentrell, via phone per patient's request to provide update on options. CSW to follow.    Expected Discharge Plan: IP Rehab Facility Barriers to Discharge: Continued Medical Work up, Air traffic controller and Services   Discharge Planning Services: CM Consult Post Acute Care Choice: IP Rehab Living arrangements for the past 2 months: Single Family Home                                       Social Drivers of Health (SDOH) Interventions SDOH Screenings   Food Insecurity: No Food Insecurity (06/30/2024)  Housing: Low Risk  (06/30/2024)  Transportation Needs: No Transportation Needs (06/30/2024)  Utilities: Not At Risk (06/30/2024)  Depression (PHQ2-9): Low Risk  (01/12/2024)  Tobacco Use: Medium Risk (06/29/2024)    Readmission Risk Interventions    05/17/2022    3:02 PM  Readmission Risk Prevention Plan  Transportation Screening Complete  PCP or Specialist Appt within 5-7 Days Complete  Home Care Screening Complete  Medication Review (RN CM) Complete

## 2024-07-06 NOTE — Progress Notes (Signed)
 Prevenia Woundvaccartridge changed. Dressing site CDI. VAc functioned properly.

## 2024-07-07 LAB — BASIC METABOLIC PANEL WITH GFR
Anion gap: 12 (ref 5–15)
BUN: 10 mg/dL (ref 8–23)
CO2: 30 mmol/L (ref 22–32)
Calcium: 9 mg/dL (ref 8.9–10.3)
Chloride: 100 mmol/L (ref 98–111)
Creatinine, Ser: 0.63 mg/dL (ref 0.44–1.00)
GFR, Estimated: >60 mL/min (ref >60)
Glucose, Bld: 88 mg/dL (ref 70–99)
Potassium: 3.8 mmol/L (ref 3.5–5.1)
Sodium: 142 mmol/L (ref 135–145)

## 2024-07-07 NOTE — Progress Notes (Signed)
 Occupational Therapy Treatment Patient Details Name: [name] MRN: [id number] DOB: Aug 17, 1961 Today's Date: 07/07/2024   History of present illness Pt is a 63 y/o F admitted for thoracic stenosis at T9/10 and LE weakness, now s/p thoracic decompression with neurosurgery on 06/29/24. Of note, pt had T9-10 laminectomy on 01/08/24 and was admitted to [facility] for rehab. PMHx: GERD, anxiety, HTN, fibromyalgia, hyperthyroidism, IBS, ACDF 3/4, posterior lateral arthrodesis 6/16, CSF leak 6/24.   OT comments  Pt greeted in supine, eager and motivated to participate with OT. Pre-medicated by RN for session. Pt needing mod A for bed mobility via log roll, progressing with trunk management; remains limited by BLE edema and RLE>LLE weakness. She tolerated sitting EOB ~20 mins with supervision-CGA, intermittent min A for stability as she fatigued with prolonged sitting during grooming tasks. Stood to [employer] with mod A, cueing for hand placement/powering up. Able to stand ~20s prior to sitting due to pain and discomfort in her legs/feet. Returned to supine at end of visit, tolerated session well. OT to continue to follow.       If plan is discharge home, recommend the following:  Two people to help with walking and/or transfers;Two people to help with bathing/dressing/bathroom;Assist for transportation;Help with stairs or ramp for entrance;Assistance with cooking/housework   Equipment Recommendations  Other (comment) (defer)    Recommendations for Other Services      Precautions / Restrictions Precautions Precautions: Back;Fall Precaution Booklet Issued: No Recall of Precautions/Restrictions: Impaired Precaution/Restrictions Comments: R AFO, cues to recall BLT's and log roll technique for bed mobility Restrictions Weight Bearing Restrictions Per Provider Order: No       Mobility Bed Mobility Overal bed mobility: Needs Assistance Bed Mobility: Rolling, Sidelying to Sit, Sit to Supine Rolling: Mod  assist, Used rails Sidelying to sit: Mod assist, HOB elevated, Used rails   Sit to supine: Max assist, HOB elevated, Used rails   General bed mobility comments: cues for log roll technique, exited to the L side; assist for trunk elevation & BLE mgmt for sit>supine    Transfers Overall transfer level: Needs assistance Equipment used: Rolling walker (2 wheels) Transfers: Sit to/from Stand Sit to Stand: Mod assist, From elevated surface           General transfer comment: Stood from bed with visual cues for hand placement & sequencing for powering up. Able to stand ~20s prior to sitting due to reduced upright tolerance.     Balance Overall balance assessment: Needs assistance Sitting-balance support: Single extremity supported, Feet supported Sitting balance-[name]: Fair Sitting balance - Comments: fatigues with prolonged sitting EOB without UE support   Standing balance support: Bilateral upper extremity supported, During functional activity, Reliant on assistive device for balance Standing balance-[name]: Poor Standing balance comment: heavily reliant on RW for UE support in stance at EOB                           ADL either performed or assessed with clinical judgement   ADL Overall ADL's : Needs assistance/impaired     Grooming: Contact guard assist;Oral care;Sitting;Minimal assistance Grooming Details (indicate cue type and reason): intermittent stability while EOB due to back fatigue             Lower Body Dressing: Total assistance;Bed level Lower Body Dressing Details (indicate cue type and reason): adjusting socks  Extremity/Trunk Assessment              Vision       Restaurant manager, fast food Communication: No apparent difficulties   Cognition Arousal: Alert Behavior During Therapy: WFL for tasks assessed/performed Cognition: No apparent impairments             OT  - Cognition Comments: talkative & tangential                 Following commands: Intact        Cueing   Cueing Techniques: Verbal cues, Tactile cues, Visual cues  Exercises      Shoulder Instructions       General Comments wound vac in place during session    Pertinent Vitals/ Pain       Pain Assessment Pain Assessment: 0-10 Pain Score: 10-Worst pain ever Pain Location: back Pain Descriptors / Indicators: Discomfort Pain Intervention(s): Limited activity within patient's tolerance, Monitored during session, Premedicated before session, Repositioned  Home Living                                          Prior Functioning/Environment              Frequency  Min 2X/week        Progress Toward Goals  OT Goals(current goals can now be found in the care plan section)  Progress towards OT goals: Progressing toward goals     Plan      Co-evaluation                 AM-PAC OT 6 Clicks Daily Activity     Outcome Measure   Help from another person eating meals?: A Little Help from another person taking care of personal grooming?: A Little Help from another person toileting, which includes using toliet, bedpan, or urinal?: Total Help from another person bathing (including washing, rinsing, drying)?: A Lot Help from another person to put on and taking off regular upper body clothing?: None Help from another person to put on and taking off regular lower body clothing?: Total 6 Click Score: 14    End of Session Equipment Utilized During Treatment: Gait belt;Rolling walker (2 wheels)  OT Visit Diagnosis: Unsteadiness on feet (R26.81);Muscle weakness (generalized) (M62.81);Pain   Activity Tolerance Patient tolerated treatment well   Patient Left in bed;with call bell/phone within reach;with bed alarm set;with SCD's reapplied   Nurse Communication Mobility status        Time: 8959-8881 OT Time Calculation (min): 38  min  Charges: OT General Charges $OT Visit: 1 Visit OT Treatments $Self Care/Home Management : 38-52 mins  Tyronica Truxillo D., MSOT, OTR/L Acute Rehabilitation Services [phone] Secure Chat Preferred   Rikki Milch 07/07/2024, 11:21 AM

## 2024-07-07 NOTE — Plan of Care (Signed)
   Problem: Activity: Goal: Risk for activity intolerance will decrease Outcome: Progressing   Problem: Nutrition: Goal: Adequate nutrition will be maintained Outcome: Progressing   Problem: Coping: Goal: Level of anxiety will decrease Outcome: Progressing   Problem: Elimination: Goal: Will not experience complications related to bowel motility Outcome: Progressing

## 2024-07-07 NOTE — Plan of Care (Signed)

## 2024-07-08 NOTE — Plan of Care (Signed)
   Problem: Education: Goal: Knowledge of General Education information will improve Description: Including pain rating scale, medication(s)/side effects and non-pharmacologic comfort measures Outcome: Progressing   Problem: Clinical Measurements: Goal: Ability to maintain clinical measurements within normal limits will improve Outcome: Progressing

## 2024-07-08 NOTE — Progress Notes (Signed)
 Patient ID: [name], female   DOB: [birth year], 63 y.o.   MRN: [id number] BP 133/78 (BP Location: Left Arm)   Pulse 99   Temp (!) 100.8 F (38.2 C) (Oral)   Resp 17   Ht 5' 2 (1.575 m)   LMP 11/20/2012   SpO2 98%   BMI 40.08 kg/m  Currently sleeping. Appeal for rehab has been filed.  Working with pt and ot No new changes. Wound vac in place.  No evidence of infection.

## 2024-07-08 NOTE — Progress Notes (Signed)
 Inpatient Rehab Admissions Coordinator:    I was able to confirm that Pt.'s friend can provide 24/7 assist at d/c. I filed expedited appeal for her yesterday and await a response.   Leita Kleine, MS, CCC-SLP Rehab Admissions Coordinator  [phone] (celll) [phone] (office)

## 2024-07-08 NOTE — Progress Notes (Signed)
 Physical Therapy Treatment Patient Details Name: [name] MRN: [id number] DOB: [birth year] Today's Date: 07/08/2024   History of Present Illness Pt is a 63 y/o F admitted for thoracic stenosis at T9/10 and LE weakness, now s/p thoracic decompression with neurosurgery on 06/29/24. Of note, pt had T9-10 laminectomy on 01/08/24 and was admitted to [facility] for rehab. PMHx: GERD, anxiety, HTN, fibromyalgia, hyperthyroidism, IBS, ACDF 3/4, posterior lateral arthrodesis 6/16, CSF leak 6/24.    PT Comments  Pt received in supine and agreeable to session. Pt able to sit to EOB with heavy use of bed features and mod A. Pt demonstrates improved sitting balance and tolerance. Pt able to stand to stedy with mod A, but requires elevated surface due to BLE weakness. Pt able to tolerate 3 standing trials with focus on upright posture and weight shifting, but is limited by fatigue and pain. Pt continues to benefit from PT services to progress toward functional mobility goals.    If plan is discharge home, recommend the following: A lot of help with walking and/or transfers;A lot of help with bathing/dressing/bathroom;Assist for transportation;Help with stairs or ramp for entrance   Can travel by private vehicle        Equipment Recommendations  Other (comment) (TBD)    Recommendations for Other Services       Precautions / Restrictions Precautions Precautions: Back;Fall Precaution Booklet Issued: No Recall of Precautions/Restrictions: Impaired Precaution/Restrictions Comments: R AFO Restrictions Weight Bearing Restrictions Per Provider Order: No     Mobility  Bed Mobility Overal bed mobility: Needs Assistance Bed Mobility: Rolling, Sidelying to Sit Rolling: Contact guard assist, Used rails Sidelying to sit: HOB elevated, Used rails, Mod assist       General bed mobility comments: Assist for BLE advancement and trunk elevation. Cues for logroll technique    Transfers Overall transfer level:  Needs assistance Equipment used: Ambulation equipment used Transfers: Sit to/from Stand, Bed to chair/wheelchair/BSC Sit to Stand: Mod assist, From elevated surface, Max assist           General transfer comment: STS from EOB with pt unable to stand from lower surface requiring elevation and mod A. Cues for upright posture due to pt tendency for forward flexion Transfer via Lift Equipment: Stedy  Ambulation/Gait             Pre-gait activities: weight shifting     Stairs             Wheelchair Mobility     Tilt Bed    Modified Rankin (Stroke Patients Only)       Balance Overall balance assessment: Needs assistance Sitting-balance support: Feet supported, Bilateral upper extremity supported Sitting balance-[name]: Fair Sitting balance - Comments: sitting EOB   Standing balance support: Bilateral upper extremity supported, During functional activity, Reliant on assistive device for balance Standing balance-[name]: Poor Standing balance comment: reliant on UE support                            Communication Communication Communication: No apparent difficulties  Cognition Arousal: Alert Behavior During Therapy: WFL for tasks assessed/performed   PT - Cognitive impairments: No apparent impairments                         Following commands: Intact      Cueing Cueing Techniques: Verbal cues, Tactile cues, Visual cues  Exercises      General Comments  Pertinent Vitals/Pain Pain Assessment Pain Assessment: Faces Faces Pain Scale: Hurts little more Pain Location: back, RLE Pain Descriptors / Indicators: Discomfort, Aching Pain Intervention(s): Limited activity within patient's tolerance, Monitored during session, Repositioned     PT Goals (current goals can now be found in the care plan section) Acute Rehab PT Goals Patient Stated Goal: to get more therapy PT Goal Formulation: With patient Time For Goal  Achievement: 07/14/24 Progress towards PT goals: Progressing toward goals    Frequency    Min 2X/week       AM-PAC PT 6 Clicks Mobility   Outcome Measure  Help needed turning from your back to your side while in a flat bed without using bedrails?: A Little Help needed moving from lying on your back to sitting on the side of a flat bed without using bedrails?: A Lot Help needed moving to and from a bed to a chair (including a wheelchair)?: Total Help needed standing up from a chair using your arms (e.g., wheelchair or bedside chair)?: A Lot Help needed to walk in hospital room?: Total Help needed climbing 3-5 steps with a railing? : Total 6 Click Score: 10    End of Session Equipment Utilized During Treatment: Gait belt Activity Tolerance: Patient tolerated treatment well;Patient limited by fatigue Patient left: with call bell/phone within reach;in chair Nurse Communication: Mobility status;Need for lift equipment PT Visit Diagnosis: Unsteadiness on feet (R26.81);Other abnormalities of gait and mobility (R26.89);Muscle weakness (generalized) (M62.81)     Time: 8857-8789 PT Time Calculation (min) (ACUTE ONLY): 28 min  Charges:    $Therapeutic Activity: 23-37 mins PT General Charges $$ ACUTE PT VISIT: 1 Visit                     Darryle George, PTA Acute Rehabilitation Services Secure Chat Preferred  Office:([phone]    Darryle George 07/08/2024, 1:45 PM

## 2024-07-09 MED ORDER — SODIUM CHLORIDE 0.9 % IV BOLUS
500.0000 mL | Freq: Once | INTRAVENOUS | Status: AC | PRN
  Administered 2024-07-09: 500 mL via INTRAVENOUS

## 2024-07-09 MED ORDER — SODIUM CHLORIDE 0.9 % IV BOLUS
500.0000 mL | Freq: Once | INTRAVENOUS | Status: AC
  Administered 2024-07-09: 500 mL via INTRAVENOUS

## 2024-07-09 MED ORDER — SODIUM CHLORIDE 0.9 % IV SOLN
INTRAVENOUS | Status: AC
Start: 2024-07-09 — End: 2024-07-10

## 2024-07-09 MED ORDER — ORAL CARE MOUTH RINSE: 15.0000 mL | OROMUCOSAL | Status: DC | PRN

## 2024-07-09 MED ORDER — DIPHENHYDRAMINE HCL 25 MG PO CAPS
25.0000 mg | ORAL_CAPSULE | Freq: Four times a day (QID) | ORAL | Status: DC | PRN
  Administered 2024-07-09 – 2024-07-28 (×38): 25 mg via ORAL
  Filled 2024-07-09 (×38): qty 1

## 2024-07-09 NOTE — Plan of Care (Signed)

## 2024-07-09 NOTE — Plan of Care (Signed)

## 2024-07-09 NOTE — Progress Notes (Signed)
 Patient ID: [name], female   DOB: Jul 14, 1961, 63 y.o.   MRN: [id number] BP (!) 107/52 (BP Location: Left Arm)   Pulse 82   Temp 99.8 F (37.7 C) (Oral)   Resp 16   Ht 5' 2 (1.575 m)   LMP 11/20/2012   SpO2 95%   BMI 40.08 kg/m  Alert and oriented x 4, speech is clear and fluent Events of this morning noted Blood pressure currently normal.  Wound vac in position.  Waiting on rehab

## 2024-07-09 NOTE — Progress Notes (Signed)
 Inpatient Rehab Admissions Coordinator:  Awaiting decision regarding expedited appeal. Will continue to follow.   Artemus Larsen, MS, CCC-SLP Admissions Coordinator [phone]

## 2024-07-09 NOTE — Progress Notes (Signed)
 Notified by nursing staff regarding hypotension 77/42 (55). NS bolus 500cc ordered.  BP improved to 91/56.

## 2024-07-09 NOTE — Progress Notes (Signed)
 Occupational Therapy Treatment Patient Details Name: [name] MRN: [id number] DOB: Jan 03, 1961 Today's Date: 07/09/2024   History of present illness Pt is a 63 y/o F admitted for thoracic stenosis at T9/10 and LE weakness, now s/p thoracic decompression with neurosurgery on 06/29/24. Of note, pt had T9-10 laminectomy on 01/08/24 and was admitted to [facility] for rehab. PMHx: GERD, anxiety, HTN, fibromyalgia, hyperthyroidism, IBS, ACDF 3/4, posterior lateral arthrodesis 6/16, CSF leak 6/24.   OT comments  Pt received in supine, agreeable for OT visit. AOX4. Pt progressing to EOB via log roll with mod A for LE's and trunk mgmt. Upright tolerance and balance improving to supervision-intermittent CGA as she fatigued. Trialed adaptive equipment for LB dressing with mod A for successful completion. Stood twice to RW with mod A +2, overall upright time improving, OT and PTA assisting with weight shifting and lateral advancement of RW to [facility] in preparation for taking steps. AMPAC improved to 16/24, indicating improving functional performance. OT to continue to follow.       If plan is discharge home, recommend the following:  Two people to help with walking and/or transfers;Two people to help with bathing/dressing/bathroom;Assist for transportation;Help with stairs or ramp for entrance;Assistance with cooking/housework   Equipment Recommendations  Other (comment) (defer)    Recommendations for Other Services      Precautions / Restrictions Precautions Precautions: Back;Fall Recall of Precautions/Restrictions: Impaired Precaution/Restrictions Comments: R AFO Restrictions Weight Bearing Restrictions Per Provider Order: No       Mobility Bed Mobility Overal bed mobility: Needs Assistance Bed Mobility: Rolling, Sidelying to Sit, Sit to Supine Rolling: Contact guard assist, Used rails Sidelying to sit: HOB elevated, Used rails, Mod assist Supine to sit: Mod assist, +2 for physical assistance,  Used rails Sit to supine: Max assist, +2 for physical assistance   General bed mobility comments: cues for log rolling, inc A for LE & trunk mgmt sit>supine 2/2 fatigue    Transfers Overall transfer level: Needs assistance Equipment used: Rolling walker (2 wheels) Transfers: Sit to/from Stand Sit to Stand: Mod assist, +2 physical assistance           General transfer comment: Stood from bed with VC for hand placement and powering up. Stood twice with attempts to [facility] and advance RW towards [facility] with fair effort. Defer to PT note for further details.     Balance Overall balance assessment: Needs assistance Sitting-balance support: Feet supported, Bilateral upper extremity supported Sitting balance-[name]: Fair Sitting balance - Comments: seated EOB, intermittent posterior LOB due to fatigue when attemptin to reposition   Standing balance support: Bilateral upper extremity supported, During functional activity, Reliant on assistive device for balance Standing balance-[name]: Poor Standing balance comment: heavily reliant on RW in stance, cues for forward gaze and relaxed shoulder posture while standing EOB                           ADL either performed or assessed with clinical judgement   ADL Overall ADL's : Needs assistance/impaired                     Lower Body Dressing: Moderate assistance;With adaptive equipment;Cueing for sequencing;Cueing for compensatory techniques;Cueing for back precautions Lower Body Dressing Details (indicate cue type and reason): instructed in use of reacher/sock aide, needs A to completely doff socks 2/2 LE edema and progressive fatigue as session progressed  Extremity/Trunk Assessment              Vision       Restaurant manager, fast food Communication: No apparent difficulties   Cognition Arousal: Alert Behavior During Therapy: WFL for tasks  assessed/performed Cognition: No apparent impairments             OT - Cognition Comments: remains tangential                 Following commands: Intact        Cueing   Cueing Techniques: Verbal cues, Tactile cues, Visual cues  Exercises Exercises: General Upper Extremity General Exercises - Upper Extremity Shoulder Flexion: AROM, Strengthening, Both, 10 reps, Supine, Theraband Theraband Level (Shoulder Flexion): Level 2 (Red) Elbow Extension: AROM, Both, 10 reps, Supine, Theraband Theraband Level (Elbow Extension): Level 2 (Red)    Shoulder Instructions       General Comments wound vac remained intact throughout session    Pertinent Vitals/ Pain       Pain Assessment Pain Assessment: Faces Faces Pain Scale: Hurts even more Pain Location: back, B knees Pain Descriptors / Indicators: Discomfort, Aching Pain Intervention(s): Limited activity within patient's tolerance, Monitored during session, Repositioned  Home Living                                          Prior Functioning/Environment              Frequency  Min 2X/week        Progress Toward Goals  OT Goals(current goals can now be found in the care plan section)  Progress towards OT goals: Progressing toward goals     Plan      Co-evaluation    PT/OT/SLP Co-Evaluation/Treatment: Yes Reason for Co-Treatment: For patient/therapist safety;To address functional/ADL transfers PT goals addressed during session: Mobility/safety with mobility;Balance;Proper use of DME OT goals addressed during session: ADL's and self-care      AM-PAC OT 6 Clicks Daily Activity     Outcome Measure   Help from another person eating meals?: None Help from another person taking care of personal grooming?: A Little Help from another person toileting, which includes using toliet, bedpan, or urinal?: Total Help from another person bathing (including washing, rinsing, drying)?: A Lot Help  from another person to put on and taking off regular upper body clothing?: None Help from another person to put on and taking off regular lower body clothing?: A Lot 6 Click Score: 16    End of Session Equipment Utilized During Treatment: Gait belt;Rolling walker (2 wheels)  OT Visit Diagnosis: Unsteadiness on feet (R26.81);Muscle weakness (generalized) (M62.81);Pain   Activity Tolerance Patient tolerated treatment well   Patient Left in bed;with call bell/phone within reach;with bed alarm set;with SCD's reapplied   Nurse Communication          Time: 8573-8491 OT Time Calculation (min): 42 min  Charges: OT General Charges $OT Visit: 1 Visit OT Treatments $Self Care/Home Management : 23-37 mins  Phil Michels D., MSOT, OTR/L Acute Rehabilitation Services [phone] Secure Chat Preferred  Rikki Milch 07/09/2024, 4:03 PM

## 2024-07-09 NOTE — TOC Progression Note (Signed)
 Transition of Care [facility]) - Progression Note    Patient Details  Name: JAHNA LIEBERT MRN: [id number] Date of Birth: [birth year]  Transition of [facility]) CM/SW Contact  Andrez [name], RN Phone Number: 07/09/2024, 9:41 AM  Clinical Narrative:     CIR is appealing denial. IP Care management following.   Expected Discharge Plan: IP Rehab Facility Barriers to Discharge: Continued Medical Work up, Air traffic controller and Services   Discharge Planning Services: CM Consult Post Acute Care Choice: IP Rehab Living arrangements for the past 2 months: Single Family Home                                       Social Drivers of Health (SDOH) Interventions SDOH Screenings   Food Insecurity: No Food Insecurity (06/30/2024)  Housing: Low Risk  (06/30/2024)  Transportation Needs: No Transportation Needs (06/30/2024)  Utilities: Not At Risk (06/30/2024)  Depression (PHQ2-9): Low Risk  (01/12/2024)  Tobacco Use: Medium Risk (06/29/2024)    Readmission Risk Interventions    05/17/2022    3:02 PM  Readmission Risk Prevention Plan  Transportation Screening Complete  PCP or Specialist Appt within 5-7 Days Complete  Home Care Screening Complete  Medication Review (RN CM) Complete

## 2024-07-09 NOTE — Progress Notes (Signed)
 Physical Therapy Treatment Patient Details Name: [name] MRN: [id number] DOB: 09/10/1961 Today's Date: 07/09/2024   History of Present Illness Pt is a 63 y/o F admitted for thoracic stenosis at T9/10 and LE weakness, now s/p thoracic decompression with neurosurgery on 06/29/24. Of note, pt had T9-10 laminectomy on 01/08/24 and was admitted to [facility] for rehab. PMHx: GERD, anxiety, HTN, fibromyalgia, hyperthyroidism, IBS, ACDF 3/4, posterior lateral arthrodesis 6/16, CSF leak 6/24.    PT Comments  Pt received EOB with OT and agreeable to session. Pt able to sit EOB for an extended time for ADL tasks, but requires UE support with increased fatigue. Pt able to tolerate 2 standing trials, but is limited by B knee pain requiring seated rest breaks. Attempted lateral steps with assist for weight shifting, but pt unable to tolerate due to pain. Pt continues to benefit from PT services to progress toward functional mobility goals.    If plan is discharge home, recommend the following: A lot of help with walking and/or transfers;A lot of help with bathing/dressing/bathroom;Assist for transportation;Help with stairs or ramp for entrance   Can travel by private vehicle        Equipment Recommendations  Other (comment) (TBD)    Recommendations for Other Services       Precautions / Restrictions Precautions Precautions: Back;Fall Recall of Precautions/Restrictions: Impaired Precaution/Restrictions Comments: R AFO Restrictions Weight Bearing Restrictions Per Provider Order: No     Mobility  Bed Mobility Overal bed mobility: Needs Assistance Bed Mobility: Rolling, Sidelying to Sit, Sit to Supine Rolling: Contact guard assist, Used rails Sidelying to sit: HOB elevated, Used rails, Mod assist   Sit to supine: Max assist, +2 for physical assistance   General bed mobility comments: Assist for BLE and trunk management    Transfers Overall transfer level: Needs assistance Equipment used:  Rolling walker (2 wheels) Transfers: Sit to/from Stand Sit to Stand: Mod assist, +2 physical assistance           General transfer comment: STS from EOB x2 with mod A +2 for power up. Attempted lateral steps towards EOB, but pt unable to tolerate standing or weight shifting to advance LEs. Pt able to weight shift to L enough for therapist to adjust R foot position    Ambulation/Gait                   Stairs             Wheelchair Mobility     Tilt Bed    Modified Rankin (Stroke Patients Only)       Balance Overall balance assessment: Needs assistance Sitting-balance support: Feet supported, Bilateral upper extremity supported Sitting balance-[name]: Fair Sitting balance - Comments: sitting EOB with intermittent posterior lean, but no LOB   Standing balance support: Bilateral upper extremity supported, During functional activity, Reliant on assistive device for balance Standing balance-[name]: Poor Standing balance comment: reliant on UE and external support                            Communication Communication Communication: No apparent difficulties  Cognition Arousal: Alert Behavior During Therapy: WFL for tasks assessed/performed   PT - Cognitive impairments: No apparent impairments                         Following commands: Intact      Cueing Cueing Techniques: Verbal cues, Tactile cues, Visual  cues  Exercises      General Comments        Pertinent Vitals/Pain Pain Assessment Pain Assessment: Faces Faces Pain Scale: Hurts even more Pain Location: back, B knees Pain Descriptors / Indicators: Discomfort, Aching Pain Intervention(s): Limited activity within patient's tolerance, Monitored during session, Repositioned     PT Goals (current goals can now be found in the care plan section) Acute Rehab PT Goals Patient Stated Goal: to get more therapy PT Goal Formulation: With patient Time For Goal  Achievement: 07/14/24 Progress towards PT goals: Progressing toward goals    Frequency    Min 2X/week      PT Plan      Co-evaluation PT/OT/SLP Co-Evaluation/Treatment: Yes Reason for Co-Treatment: For patient/therapist safety;To address functional/ADL transfers PT goals addressed during session: Mobility/safety with mobility;Balance;Proper use of DME        AM-PAC PT 6 Clicks Mobility   Outcome Measure  Help needed turning from your back to your side while in a flat bed without using bedrails?: A Little Help needed moving from lying on your back to sitting on the side of a flat bed without using bedrails?: A Lot Help needed moving to and from a bed to a chair (including a wheelchair)?: Total Help needed standing up from a chair using your arms (e.g., wheelchair or bedside chair)?: A Lot Help needed to walk in hospital room?: Total Help needed climbing 3-5 steps with a railing? : Total 6 Click Score: 10    End of Session Equipment Utilized During Treatment: Gait belt Activity Tolerance: Patient tolerated treatment well;Patient limited by fatigue;Patient limited by pain Patient left: in bed;with call bell/phone within reach;with bed alarm set Nurse Communication: Mobility status PT Visit Diagnosis: Unsteadiness on feet (R26.81);Other abnormalities of gait and mobility (R26.89);Muscle weakness (generalized) (M62.81)     Time: 8566-8490 PT Time Calculation (min) (ACUTE ONLY): 36 min  Charges:    $Therapeutic Activity: 8-22 mins PT General Charges $$ ACUTE PT VISIT: 1 Visit                     Darryle George, PTA Acute Rehabilitation Services Secure Chat Preferred  [phone]    Darryle George 07/09/2024, 3:30 PM

## 2024-07-10 NOTE — Progress Notes (Signed)
 Pt agreed to wound vac change this am. Wound vac alarming again even with reinforcement. Wound cleansed and dried. Purulent drainage noted from top portion. Dayshift RN made aware.

## 2024-07-10 NOTE — Plan of Care (Addendum)
 Pt is alert and oriented x 4. SCD on. Vitals stable. Received 2 doses of prn oxy. 1 dose of benadryl . Wound vac beeping. Checked tubing. Reinforced dressing. Beeping no longer noted. Improved suction and drainage noted in tubing. Pt reluctant to change full dressing. Reinforced all 4 sides. Reported had been beeping on and off for the last day. Will continue to monitor.  Problem: Education: Goal: Knowledge of General Education information will improve Description: Including pain rating scale, medication(s)/side effects and non-pharmacologic comfort measures Outcome: Progressing   Problem: Health Behavior/Discharge Planning: Goal: Ability to manage health-related needs will improve Outcome: Progressing   Problem: Clinical Measurements: Goal: Ability to maintain clinical measurements within normal limits will improve Outcome: Progressing Goal: Will remain free from infection Outcome: Progressing Goal: Diagnostic test results will improve Outcome: Progressing Goal: Respiratory complications will improve Outcome: Progressing Goal: Cardiovascular complication will be avoided Outcome: Progressing   Problem: Activity: Goal: Risk for activity intolerance will decrease Outcome: Progressing   Problem: Nutrition: Goal: Adequate nutrition will be maintained Outcome: Progressing   Problem: Coping: Goal: Level of anxiety will decrease Outcome: Progressing   Problem: Elimination: Goal: Will not experience complications related to bowel motility Outcome: Progressing Goal: Will not experience complications related to urinary retention Outcome: Progressing   Problem: Pain Managment: Goal: General experience of comfort will improve and/or be controlled Outcome: Progressing   Problem: Safety: Goal: Ability to remain free from injury will improve Outcome: Progressing   Problem: Skin Integrity: Goal: Risk for impaired skin integrity will decrease Outcome: Progressing   Problem:  Education: Goal: Ability to verbalize activity precautions or restrictions will improve Outcome: Progressing Goal: Knowledge of the prescribed therapeutic regimen will improve Outcome: Progressing Goal: Understanding of discharge needs will improve Outcome: Progressing   Problem: Activity: Goal: Ability to avoid complications of mobility impairment will improve Outcome: Progressing Goal: Ability to tolerate increased activity will improve Outcome: Progressing Goal: Will remain free from falls Outcome: Progressing   Problem: Bowel/Gastric: Goal: Gastrointestinal status for postoperative course will improve Outcome: Progressing   Problem: Clinical Measurements: Goal: Ability to maintain clinical measurements within normal limits will improve Outcome: Progressing Goal: Postoperative complications will be avoided or minimized Outcome: Progressing Goal: Diagnostic test results will improve Outcome: Progressing   Problem: Pain Management: Goal: Pain level will decrease Outcome: Progressing   Problem: Skin Integrity: Goal: Will show signs of wound healing Outcome: Progressing   Problem: Health Behavior/Discharge Planning: Goal: Identification of resources available to assist in meeting health care needs will improve Outcome: Progressing   Problem: Bladder/Genitourinary: Goal: Urinary functional status for postoperative course will improve Outcome: Progressing

## 2024-07-10 NOTE — Progress Notes (Signed)
 Patient ID: [name], female   DOB: 26-Aug-1961, 63 y.o.   MRN: [id number] No change.  She is lying in bed comfortably.  She moves her feet well.  VAC is in place.

## 2024-07-11 NOTE — Progress Notes (Signed)
    Providing Compassionate, Quality Care - Together   NEUROSURGERY PROGRESS NOTE     S: No issues overnight.    O: EXAM:  BP 119/68 (BP Location: Left Arm)   Pulse 97   Temp 97.9 F (36.6 C) (Oral)   Resp 18   Ht 5' 2 (1.575 m)   LMP 11/20/2012   SpO2 92%   BMI 40.08 kg/m     Awake, alert, oriented  Speech fluent, appropriate  MAES, antigravity Wound vac torn, nonfunctional   ASSESSMENT:  63 y.o. s/p T9/10 lami/discectomy    PLAN: -Remove wound vac - place sterile dry dressing -Continue supportive care -Placement pending.  -Call w/ questions/concerns.   Camie Pickle, [facility]

## 2024-07-12 LAB — GLUCOSE, CAPILLARY: Glucose-Capillary: 154 mg/dL — ABNORMAL HIGH (ref 70–99)

## 2024-07-12 NOTE — Progress Notes (Signed)
 Patient ID: [name], female   DOB: [birth year], 63 y.o.   MRN: [id number] BP 125/85 (BP Location: Left Arm)   Pulse 91   Temp 99.1 F (37.3 C) (Oral)   Resp 18   Ht 5' 2 (1.575 m)   LMP 11/20/2012   SpO2 90%   BMI 40.08 kg/m  Currently sleeping CIR denied, and [name] appeals exhausted Will follow up tomorrow.

## 2024-07-12 NOTE — TOC Progression Note (Signed)
 Transition of Care [facility]) - Progression Note    Patient Details  Name: [name] MRN: [id number] Date of Birth: 1960-10-31  Transition of [facility]) CM/SW Contact  [name], [name] Phone Number: 07/12/2024, 2:31 PM  Clinical Narrative:   CSW spoke with patient to discuss disposition plan, since CIR was denied. Patient said she needed to speak with her son tonight first before she made any decisions, would not allow CSW to send out referral to SNF or discuss anything until after she speaks with her son. CSW to follow up with patient tomorrow.    Expected Discharge Plan: Skilled Nursing Facility Barriers to Discharge: Continued Medical Work up, English as a second language teacher               Expected Discharge Plan and Services   Discharge Planning Services: CM Consult Post Acute Care Choice: IP Rehab Living arrangements for the past 2 months: Single Family Home                                       Social Drivers of Health (SDOH) Interventions SDOH Screenings   Food Insecurity: No Food Insecurity (06/30/2024)  Housing: Low Risk  (06/30/2024)  Transportation Needs: No Transportation Needs (06/30/2024)  Utilities: Not At Risk (06/30/2024)  Depression (PHQ2-9): Low Risk  (01/12/2024)  Tobacco Use: Medium Risk (06/29/2024)    Readmission Risk Interventions    05/17/2022    3:02 PM  Readmission Risk Prevention Plan  Transportation Screening Complete  PCP or Specialist Appt within 5-7 Days Complete  Home Care Screening Complete  Medication Review (RN CM) Complete

## 2024-07-12 NOTE — Progress Notes (Signed)
 Inpatient Rehab Admissions Coordinator:    Pt. Ultimately lost her expedited appeal for CIR. She has no further options for appeal. She will need SNF vs. HH.  I notified TOC and pt. CIR will sign off.   Leita Kleine, MS, CCC-SLP Rehab Admissions Coordinator  [phone] (celll) [phone] (office)

## 2024-07-12 NOTE — Progress Notes (Signed)
 PT Cancellation Note  Patient Details Name: [name] MRN: [id number] DOB: 08/13/1961   Cancelled Treatment:    Reason Eval/Treat Not Completed: (P) Pain limiting ability to participate (Pt initially agreeable to session, however reports increased abdominal pain to 10/10 and headache. Pt requests to rest and RN notified pt requesting pain meds. Will continue to follow per PT POC.)   Darryle George 07/12/2024, 3:17 PM

## 2024-07-13 NOTE — TOC Progression Note (Signed)
 Transition of Care [facility]) - Progression Note    Patient Details  Name: [name] MRN: [id number] Date of Birth: 10/31/1960  [facility]) CM/SW Contact  [name], [name] Phone Number: 07/13/2024, 11:06 AM  Clinical Narrative:   CSW spoke with patient to discuss disposition. Patient reported that she was unable to speak with her son last night due to running a fever and feeling ill, she will attempt to call him over lunch today. CSW to follow back with patient for decision.    Expected Discharge Plan: Skilled Nursing Facility Barriers to Discharge: Continued Medical Work up, English as a second language teacher               Expected Discharge Plan and Services   Discharge Planning Services: CM Consult Post Acute Care Choice: IP Rehab Living arrangements for the past 2 months: Single Family Home                                       Social Drivers of Health (SDOH) Interventions SDOH Screenings   Food Insecurity: No Food Insecurity (06/30/2024)  Housing: Low Risk  (06/30/2024)  Transportation Needs: No Transportation Needs (06/30/2024)  Utilities: Not At Risk (06/30/2024)  Depression (PHQ2-9): Low Risk  (01/12/2024)  Tobacco Use: Medium Risk (06/29/2024)    Readmission Risk Interventions    05/17/2022    3:02 PM  Readmission Risk Prevention Plan  Transportation Screening Complete  PCP or Specialist Appt within 5-7 Days Complete  Home Care Screening Complete  Medication Review (RN CM) Complete

## 2024-07-13 NOTE — Progress Notes (Signed)
 Physical Therapy Treatment Patient Details Name: [name] MRN: [id number] DOB: 04-02-1961 Today's Date: 07/13/2024   History of Present Illness Pt is a 63 y/o F admitted for thoracic stenosis at T9/10 and LE weakness, now s/p thoracic decompression with neurosurgery on 06/29/24. Of note, pt had T9-10 laminectomy on 01/08/24 and was admitted to [facility] for rehab. PMHx: GERD, anxiety, HTN, fibromyalgia, hyperthyroidism, IBS, ACDF 3/4, posterior lateral arthrodesis 6/16, CSF leak 6/24.    PT Comments  Pt received in supine and agreeable to bed level exercises. Pt declines EOB/OOB mobility due to concern about incision, however pt requests exercises for BLE strength and ROM. Pt instructed in exercises with pt able to demo with intermittent min A for offloading and technique. Pt unable to perform exercises through full available ROM due to weakness. Pt continues to benefit from PT services to progress toward functional mobility goals.    If plan is discharge home, recommend the following: A lot of help with walking and/or transfers;A lot of help with bathing/dressing/bathroom;Assist for transportation;Help with stairs or ramp for entrance   Can travel by private vehicle        Equipment Recommendations  Other (comment) (TBD)    Recommendations for Other Services       Precautions / Restrictions Precautions Precautions: Back;Fall Precaution Booklet Issued: No Recall of Precautions/Restrictions: Impaired Precaution/Restrictions Comments: R AFO Restrictions Weight Bearing Restrictions Per Provider Order: No     Mobility  Bed Mobility Overal bed mobility: Needs Assistance             General bed mobility comments: Pt able to use bedrails to lean trunk forward for pillow placement with min A. pt declines EOB this session    Transfers                        Ambulation/Gait                   Stairs             Wheelchair Mobility     Tilt Bed     Modified Rankin (Stroke Patients Only)       Balance       Sitting balance - Comments: nt                                    Communication Communication Communication: No apparent difficulties  Cognition Arousal: Alert Behavior During Therapy: WFL for tasks assessed/performed   PT - Cognitive impairments: No apparent impairments                         Following commands: Intact      Cueing Cueing Techniques: Verbal cues, Tactile cues, Visual cues  Exercises General Exercises - Lower Extremity Ankle Circles/Pumps: AROM, Supine, Both, 5 reps Quad Sets: AROM, Supine, Both, 5 reps Heel Slides: PROM, AROM, Supine, Both, 5 reps Hip ABduction/ADduction: AROM, Supine, Both, 5 reps    General Comments        Pertinent Vitals/Pain Pain Assessment Pain Assessment: Faces Faces Pain Scale: Hurts little more Pain Location: BLE with mobility Pain Descriptors / Indicators: Discomfort, Aching Pain Intervention(s): Monitored during session     PT Goals (current goals can now be found in the care plan section) Acute Rehab PT Goals Patient Stated Goal: to get more therapy PT Goal Formulation: With patient Time For  Goal Achievement: 07/14/24 Progress towards PT goals: Progressing toward goals    Frequency    Min 2X/week       AM-PAC PT 6 Clicks Mobility   Outcome Measure  Help needed turning from your back to your side while in a flat bed without using bedrails?: A Little Help needed moving from lying on your back to sitting on the side of a flat bed without using bedrails?: A Lot Help needed moving to and from a bed to a chair (including a wheelchair)?: Total Help needed standing up from a chair using your arms (e.g., wheelchair or bedside chair)?: A Lot Help needed to walk in hospital room?: Total Help needed climbing 3-5 steps with a railing? : Total 6 Click Score: 10    End of Session Equipment Utilized During Treatment: Gait  belt Activity Tolerance: Patient tolerated treatment well;Other (comment) (concern about incision) Patient left: in bed;with call bell/phone within reach;with bed alarm set Nurse Communication: Mobility status PT Visit Diagnosis: Unsteadiness on feet (R26.81);Other abnormalities of gait and mobility (R26.89);Muscle weakness (generalized) (M62.81)     Time: 8459-8392 PT Time Calculation (min) (ACUTE ONLY): 27 min  Charges:    $Therapeutic Exercise: 23-37 mins PT General Charges $$ ACUTE PT VISIT: 1 Visit                    Darryle George, PTA Acute Rehabilitation Services Secure Chat Preferred  [phone]    Darryle George 07/13/2024, 4:14 PM

## 2024-07-13 NOTE — Progress Notes (Signed)
 Occupational Therapy Treatment Patient Details Name: [name] MRN: [id number] DOB: 03-11-1961 Today's Date: 07/13/2024   History of present illness Pt is a 63 y/o F admitted for thoracic stenosis at T9/10 and LE weakness, now s/p thoracic decompression with neurosurgery on 06/29/24. Of note, pt had T9-10 laminectomy on 01/08/24 and was admitted to [facility] for rehab. PMHx: GERD, anxiety, HTN, fibromyalgia, hyperthyroidism, IBS, ACDF 3/4, posterior lateral arthrodesis 6/16, CSF leak 6/24.   OT comments  Pt received in supine, eager and motivated to participate with OT. Min A to transition EOB via log roll technique. Participated in BUE HEP via red theraband - see below for details. Tolerated sitting EOB ~22 mins, requiring increased rest breaks due to back pain/fatigue despite being premedicated prior to OT arrival. Returned to supine at end of visit. OT to continue to follow.      If plan is discharge home, recommend the following:  Two people to help with walking and/or transfers;Two people to help with bathing/dressing/bathroom;Assist for transportation;Help with stairs or ramp for entrance;Assistance with cooking/housework   Equipment Recommendations  Other (comment) (defer)    Recommendations for Other Services      Precautions / Restrictions Precautions Precautions: Back;Fall Precaution Booklet Issued: No Recall of Precautions/Restrictions: Impaired Precaution/Restrictions Comments: R AFO Restrictions Weight Bearing Restrictions Per Provider Order: No       Mobility Bed Mobility Overal bed mobility: Needs Assistance Bed Mobility: Rolling, Sidelying to Sit, Sit to Supine Rolling: Min assist, Used rails Sidelying to sit: Min assist, HOB elevated, Used rails   Sit to supine: Mod assist, HOB elevated, Used rails   General bed mobility comments: assist for trunk elevation via log roll technique    Transfers                         Balance Overall balance  assessment: Needs assistance Sitting-balance support: Feet supported, Bilateral upper extremity supported Sitting balance-[name]: Fair Sitting balance - Comments: heavily reliant on BUE for support at EOB due to back fatigue/pain                                   ADL either performed or assessed with clinical judgement   ADL Overall ADL's : Needs assistance/impaired                                            Extremity/Trunk Assessment              Vision       Perception     Praxis     Communication Communication Communication: No apparent difficulties   Cognition Arousal: Alert Behavior During Therapy: WFL for tasks assessed/performed Cognition: No apparent impairments             OT - Cognition Comments: tangential                 Following commands: Intact        Cueing   Cueing Techniques: Verbal cues, Tactile cues, Visual cues  Exercises Exercises: General Upper Extremity General Exercises - Upper Extremity Shoulder Flexion: AROM, Strengthening, Both, 15 reps, Seated, Theraband Theraband Level (Shoulder Flexion): Level 2 (Red) Elbow Flexion: AROM, Strengthening, Both, 15 reps, Seated, Theraband Theraband Level (Elbow Flexion): Level 2 (Red) Elbow Extension: PROM,  Strengthening, Both, 15 reps, Seated, Theraband Theraband Level (Elbow Extension): Level 2 (Red)    Shoulder Instructions       General Comments      Pertinent Vitals/ Pain       Pain Assessment Pain Assessment: Faces Faces Pain Scale: Hurts even more Pain Location: back around incision site Pain Descriptors / Indicators: Discomfort, Aching Pain Intervention(s): Limited activity within patient's tolerance, Monitored during session, Repositioned, Premedicated before session  Home Living                                          Prior Functioning/Environment              Frequency  Min 2X/week         Progress Toward Goals  OT Goals(current goals can now be found in the care plan section)  Progress towards OT goals: Progressing toward goals     Plan      Co-evaluation                 AM-PAC OT 6 Clicks Daily Activity     Outcome Measure   Help from another person eating meals?: None Help from another person taking care of personal grooming?: A Little Help from another person toileting, which includes using toliet, bedpan, or urinal?: Total Help from another person bathing (including washing, rinsing, drying)?: A Lot Help from another person to put on and taking off regular upper body clothing?: None Help from another person to put on and taking off regular lower body clothing?: A Lot 6 Click Score: 16    End of Session    OT Visit Diagnosis: Unsteadiness on feet (R26.81);Muscle weakness (generalized) (M62.81);Pain   Activity Tolerance Patient tolerated treatment well   Patient Left in bed;with call bell/phone within reach;with bed alarm set;with SCD's reapplied   Nurse Communication          Time: 9043-8970 OT Time Calculation (min): 33 min  Charges: OT General Charges $OT Visit: 1 Visit OT Treatments $Therapeutic Activity: 8-22 mins $Therapeutic Exercise: 8-22 mins  Cochise Dinneen D., MSOT, OTR/L Acute Rehabilitation Services [phone] Secure Chat Preferred  [name] 07/13/2024, 12:55 PM

## 2024-07-13 NOTE — Plan of Care (Signed)
  Problem: Education: Goal: Knowledge of General Education information will improve Description: Including pain rating scale, medication(s)/side effects and non-pharmacologic comfort measures Outcome: Progressing   Problem: Clinical Measurements: Goal: Ability to maintain clinical measurements within normal limits will improve Outcome: Progressing   Problem: Nutrition: Goal: Adequate nutrition will be maintained Outcome: Progressing   Problem: Elimination: Goal: Will not experience complications related to bowel motility Outcome: Progressing Goal: Will not experience complications related to urinary retention Outcome: Progressing   Problem: Pain Managment: Goal: General experience of comfort will improve and/or be controlled Outcome: Progressing   Problem: Clinical Measurements: Goal: Ability to maintain clinical measurements within normal limits will improve Outcome: Progressing

## 2024-07-13 NOTE — Progress Notes (Signed)
 Patient ID: [name], female   DOB: 11-17-1960, 64 y.o.   MRN: [id number] BP 117/77 (BP Location: Left Arm)   Pulse 87   Temp 98.8 F (37.1 C) (Oral)   Resp 18   Ht 5' 2 (1.575 m)   LMP 11/20/2012   SpO2 92%   BMI 40.08 kg/m  Alert and oriented x 4. Wound vac was changed. Some of the antibiotic beads have come out of the wound. Wound looks good, no infection appreciated.  Will contact her insurance tomorrow. Moving lower extremities well Continue with PT, OT

## 2024-07-14 NOTE — Progress Notes (Signed)
 Patient ID: [name], female   DOB: [birth year], 62 y.o.   MRN: [id number] BP 109/77 (BP Location: Left Arm)   Pulse 84   Temp 98.6 F (37 C) (Oral)   Resp 17   Ht 5' 2 (1.575 m)   LMP 11/20/2012   SpO2 92%   BMI 40.08 kg/m  Alert and oriented. Dressing in place, monitor is beeping. Instructed nurse to reinforce the edges.  Moving all extremities Called [facility], no opportunity to speak with a human. Of no help.

## 2024-07-14 NOTE — Plan of Care (Signed)
  Problem: Education: Goal: Knowledge of General Education information will improve Description: Including pain rating scale, medication(s)/side effects and non-pharmacologic comfort measures Outcome: Progressing   Problem: Clinical Measurements: Goal: Ability to maintain clinical measurements within normal limits will improve Outcome: Progressing   Problem: Activity: Goal: Risk for activity intolerance will decrease Outcome: Progressing   Problem: Nutrition: Goal: Adequate nutrition will be maintained Outcome: Progressing   Problem: Elimination: Goal: Will not experience complications related to bowel motility Outcome: Progressing Goal: Will not experience complications related to urinary retention Outcome: Progressing   Problem: Pain Managment: Goal: General experience of comfort will improve and/or be controlled Outcome: Progressing   Problem: Safety: Goal: Ability to remain free from injury will improve Outcome: Progressing

## 2024-07-15 NOTE — TOC Progression Note (Signed)
 Transition of Care [facility]) - Progression Note    Patient Details  Name: LIANAH PEED MRN: [id number] Date of Birth: Jul 01, 1961  [facility] CM/SW Contact  [name], [name] Phone Number: 07/15/2024, 2:40 PM  Clinical Narrative:   CSW spoke with patient to discuss disposition. Patient reporting that she is still not feeling well, and is worried about leg swelling. Patient has not had a chance to discuss disposition with her son, but is in agreement to looking at options for SNF. Patient wants to be able to go home, but is concerned about going home without any rehab first. CSW completed referral and faxed out, will follow with bed offers.    Expected Discharge Plan: Skilled Nursing Facility Barriers to Discharge: Continued Medical Work up, English as a second language teacher               Expected Discharge Plan and Services   Discharge Planning Services: CM Consult Post Acute Care Choice: IP Rehab Living arrangements for the past 2 months: Single Family Home                                       Social Drivers of Health (SDOH) Interventions SDOH Screenings   Food Insecurity: No Food Insecurity (06/30/2024)  Housing: Low Risk  (06/30/2024)  Transportation Needs: No Transportation Needs (06/30/2024)  Utilities: Not At Risk (06/30/2024)  Depression (PHQ2-9): Low Risk  (01/12/2024)  Tobacco Use: Medium Risk (06/29/2024)    Readmission Risk Interventions    05/17/2022    3:02 PM  Readmission Risk Prevention Plan  Transportation Screening Complete  PCP or Specialist Appt within 5-7 Days Complete  Home Care Screening Complete  Medication Review (RN CM) Complete

## 2024-07-15 NOTE — NC FL2 (Signed)
 [place]  MEDICAID FL2 LEVEL OF CARE FORM     IDENTIFICATION  Patient Name: [name] Birthdate: 01-17-1961 Sex: female Admission Date (Current Location): 06/29/2024  [facility] and [place] Number:  Producer, television/film/video and Address:  [facility] [place]. [facility], [address] [place], [name] [address]      Provider Number: [id number]  Attending Physician Name and Address:  Gillie Duncans, MD  Relative Name and Phone Number:       Current Level of Care: Hospital Recommended Level of Care: Skilled Nursing Facility Prior Approval Number:    Date Approved/Denied:   PASRR Number: [id number]  Discharge Plan: SNF    Current Diagnoses: Patient Active Problem List   Diagnosis Date Noted   Weakness of both lower extremities 04/08/2024   Neurogenic bowel 01/20/2024   Neurogenic bladder 01/20/2024   Thoracic discitis 01/19/2024   Thoracic myelopathy 01/19/2024   Thyroid  dysfunction 01/14/2024   Irritable bowel syndrome 01/14/2024   Thoracic stenosis 01/08/2024   Thoracic spinal stenosis 01/08/2024   Obesity, Class III, BMI 40-49.9 (morbid obesity) (HCC) 12/30/2023   History of long term use of antibiotics due to prior spinal hardware infection MSSA and Enterobacter cloacae 12/30/2023   Osteomyelitis of thoracic region (HCC) 12/16/2023   Medication management 12/15/2023   Falls frequently 11/27/2023   Problem with vascular access--PICC line dislodgement 11/26/2023   T9 discitis/osteomyelitis and T9 epidural phlegmon/abscess s/p aspiration 10/29/2023 10/28/2023   Wound infection after surgery 05/14/2022   Delayed surgical wound healing 05/12/2022   Postoperative wound infection 04/08/2022   Scoliosis due to degenerative disease of spine in adult patient 03/30/2022   HNP (herniated nucleus pulposus), cervical 12/08/2020   Lumbago with sciatica, unspecified side 07/28/2020   PICC (peripherally inserted central catheter) in place 03/16/2020   Encounter for  long-term (current) use of antibiotics 03/16/2020   Polypharmacy 03/09/2020   At risk for adverse drug event 03/09/2020   MSSA bacteremia 02/29/2020   Postoperative infection 02/28/2020   Abnormal gait due to muscle weakness 02/26/2020   HNP (herniated nucleus pulposus), lumbar 02/16/2020   Muscle weakness of lower extremity 02/14/2020   Spondylolisthesis of lumbar region 10/30/2017   Painful orthopaedic hardware right ankle 05/27/2016   Closed fracture of distal end of right fibula and tibia    Ankle syndesmosis disruption    ASCUS favor benign 01/06/2014   Leg swelling 12/22/2013   Seasonal allergies    Anemia    Chronic back pain due to DJD with history surgery    Hyperthyroidism    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 03/24/2012   Status post left total prosthetic replacement of knee joint using cement 03/24/2012   Hypokalemia 01/28/2012   Fibromyalgia    GERD (gastroesophageal reflux disease)    Anxiety    Depression    Hypertension    Lumbar degenerative disc disease 09/25/2011    Orientation RESPIRATION BLADDER Height & Weight     Self, Time, Situation, Place  Normal Incontinent Weight:   Height:  5' 2 (157.5 cm)  BEHAVIORAL SYMPTOMS/MOOD NEUROLOGICAL BOWEL NUTRITION STATUS      Continent Diet (regular)  AMBULATORY STATUS COMMUNICATION OF NEEDS Skin   Extensive Assist Verbally Surgical wounds, Wound Vac, Other (Comment) (closed back incision, prevena wound vac; right thigh pressure injury, foam dressing)                       Personal Care Assistance Level of Assistance  Bathing, Feeding, Dressing  Bathing Assistance: Maximum assistance Feeding assistance: Independent Dressing Assistance: Maximum assistance     Functional Limitations Info             SPECIAL CARE FACTORS FREQUENCY  PT (By licensed PT), OT (By licensed OT)     PT Frequency: 5x/wk OT Frequency: 5x/wk            Contractures Contractures Info: Not present     Additional Factors Info  Code Status, Allergies Code Status Info: Full Allergies Info: Monosodium Glutamate, Shellfish Allergy, Melatonin, Baclofen, Celebrex  (Celecoxib ), Ciprofloxacin , Contrast Media (Iodinated Contrast Media), Diclofenac , Gadolinium, Molds & Smuts, Other, Promethazine , Statins, Sulfa  Antibiotics, Latex           Current Medications (07/15/2024):  This is the current hospital active medication list Current Facility-Administered Medications  Medication Dose Route Frequency Provider Last Rate Last Admin   0.9 % NaCl with KCl 20 mEq/ L  infusion   Intravenous Continuous Cabbell, Kyle, MD   Stopped at 07/01/24 2355   acetaminophen  (TYLENOL ) tablet 650 mg  650 mg Oral Q4H PRN Gillie Duncans, MD   650 mg at 07/12/24 2133   Or   acetaminophen  (TYLENOL ) suppository 650 mg  650 mg Rectal Q4H PRN Gillie Duncans, MD       albuterol  (PROVENTIL ) (2.5 MG/3ML) 0.083% nebulizer solution 3 mL  3 mL Inhalation Q6H PRN Gillie Duncans, MD   3 mL at 06/30/24 0121   ascorbic acid  (VITAMIN C ) tablet 500 mg  500 mg Oral Daily Cabbell, Kyle, MD   500 mg at 07/15/24 0859   aspirin  EC tablet 81 mg  81 mg Oral Daily Cabbell, Kyle, MD   81 mg at 07/15/24 9095   atenolol  (TENORMIN ) tablet 50 mg  50 mg Oral Daily Cabbell, Kyle, MD   50 mg at 07/15/24 0859   bisacodyl  (DULCOLAX) EC tablet 5 mg  5 mg Oral Daily PRN Gillie Duncans, MD       celecoxib  (CELEBREX ) capsule 200 mg  200 mg Oral BID Cabbell, Kyle, MD   200 mg at 07/15/24 9095   chlorzoxazone  (PARAFON ) tablet 500 mg  500 mg Oral TID Cabbell, Kyle, MD   500 mg at 07/15/24 9093   cholecalciferol  (VITAMIN D3) 25 MCG (1000 UNIT) tablet   Oral Daily Cabbell, Kyle, MD   1,000 Units at 07/15/24 0900   cyanocobalamin  (VITAMIN B12) tablet 500 mcg  500 mcg Oral Daily Cabbell, Kyle, MD   500 mcg at 07/15/24 9096   cyclobenzaprine  (FLEXERIL ) tablet 10 mg  10 mg Oral TID Cabbell, Kyle, MD   10 mg at 07/15/24 0904   diazepam  (VALIUM ) tablet 5 mg  5 mg Oral Q12H  PRN Gillie Duncans, MD   5 mg at 07/15/24 1207   diphenhydrAMINE  (BENADRYL ) capsule 25 mg  25 mg Oral Q6H PRN Johnanna Credit Caylin, PA-C   25 mg at 07/15/24 9055   doxycycline  (VIBRA -TABS) tablet 100 mg  100 mg Oral BID Cabbell, Kyle, MD   100 mg at 07/15/24 0900   gabapentin  (NEURONTIN ) capsule 1,200 mg  1,200 mg Oral TID Cabbell, Kyle, MD   1,200 mg at 07/15/24 9141   hydrochlorothiazide  (HYDRODIURIL ) tablet 50 mg  50 mg Oral q AM Cabbell, Kyle, MD   50 mg at 07/15/24 0900   influenza vac split trivalent PF (FLUZONE ) injection 0.5 mL  0.5 mL Intramuscular Tomorrow-1000 Gillie Duncans, MD       lubiprostone  (AMITIZA ) capsule 48 mcg  48 mcg Oral Q lunch Gillie Duncans, MD  48 mcg at 07/15/24 1208   magnesium  citrate solution 1 Bottle  1 Bottle Oral Once PRN Gillie Duncans, MD       menthol  (CEPACOL) lozenge 3 mg  1 lozenge Oral PRN Cabbell, Kyle, MD       Or   phenol (CHLORASEPTIC) mouth spray 1 spray  1 spray Mouth/Throat PRN Cabbell, Kyle, MD       morphine  (PF) 2 MG/ML injection 2 mg  2 mg Intravenous Q2H PRN Gillie Duncans, MD   2 mg at 07/02/24 2201   multivitamin with minerals tablet 1 tablet  1 tablet Oral q AM Cabbell, Kyle, MD   1 tablet at 07/15/24 9140   naloxone  (NARCAN ) nasal spray 4 mg/0.1 mL  1 spray Nasal Once PRN Gillie Duncans, MD       ondansetron  (ZOFRAN ) tablet 4 mg  4 mg Oral Q6H PRN Gillie Duncans, MD   4 mg at 07/08/24 1816   Or   ondansetron  (ZOFRAN ) injection 4 mg  4 mg Intravenous Q6H PRN Gillie Duncans, MD       Oral care mouth rinse  15 mL Mouth Rinse PRN Cabbell, Kyle, MD       oxyCODONE  (Oxy IR/ROXICODONE ) immediate release tablet 10 mg  10 mg Oral Q3H PRN Cabbell, Kyle, MD   10 mg at 07/15/24 1425   oxyCODONE  (Oxy IR/ROXICODONE ) immediate release tablet 5 mg  5 mg Oral Q3H PRN Gillie Duncans, MD   5 mg at 07/12/24 0818   oxyCODONE  (OXYCONTIN ) 12 hr tablet 15 mg  15 mg Oral Q12H Cabbell, Kyle, MD   15 mg at 07/15/24 0900   pantoprazole  (PROTONIX ) EC tablet 40 mg  40 mg  Oral Daily Cabbell, Kyle, MD   40 mg at 07/15/24 0859   senna (SENOKOT) tablet 8.6 mg  1 tablet Oral BID Cabbell, Kyle, MD   8.6 mg at 07/15/24 1207   senna-docusate (Senokot-S) tablet 1 tablet  1 tablet Oral QHS PRN Gillie Duncans, MD   1 tablet at 07/06/24 2140   sodium chloride  flush (NS) 0.9 % injection 3 mL  3 mL Intravenous Q12H Gillie Duncans, MD   3 mL at 07/15/24 1000   sodium chloride  flush (NS) 0.9 % injection 3 mL  3 mL Intravenous PRN Cabbell, Kyle, MD         Discharge Medications: Please see discharge summary for a list of discharge medications.  Relevant Imaging Results:  Relevant Lab Results:   Additional Information SS#: [id number]  [name], LCSW

## 2024-07-15 NOTE — Plan of Care (Signed)

## 2024-07-15 NOTE — Progress Notes (Signed)
 Patient ID: [name], female   DOB: [birth year], 62 y.o.   MRN: [id number] BP 121/87 (BP Location: Left Arm)   Pulse 89   Temp 99.1 F (37.3 C) (Oral)   Resp 19   Ht 5' 2 (1.575 m)   LMP 11/20/2012   SpO2 100%   BMI 40.08 kg/m  Alert, oriented x 4, speech is clear and fluent Moving all extremities Wound vac in place Entertaining offers for post hospital care.

## 2024-07-16 NOTE — Progress Notes (Signed)
 Occupational Therapy Treatment Patient Details Name: [name] MRN: [id number] DOB: 1960/10/23 Today's Date: 07/16/2024   History of present illness Pt is a 63 y/o F admitted for thoracic stenosis at T9/10 and LE weakness, now s/p thoracic decompression with neurosurgery on 06/29/24. Of note, pt had T9-10 laminectomy on 01/08/24 and was admitted to [facility] for rehab. PMHx: GERD, anxiety, HTN, fibromyalgia, hyperthyroidism, IBS, ACDF 3/4, posterior lateral arthrodesis 6/16, CSF leak 6/24.   OT comments  Pt received in supine, agreeable for OT visit. Endorsing minimal pain, motivated to participate. Pt progressed to bathing and various ADLs at the sink today via St[place]. Overall, mod A for LB ADLs, setup/CGA for UB ADLs sitting & standing on Stedy. Mod A to stand from the bed and min A with improved transfer technique from the [facility], likely 2/2 elevated surface height. Pt's overall activity tolerance improving, however is still functioning below her baseline. Ongoing skilled acute OT services still indicated to facilitate return to PLOF.       If plan is discharge home, recommend the following:  Two people to help with walking and/or transfers;Two people to help with bathing/dressing/bathroom;Assist for transportation;Help with stairs or ramp for entrance;Assistance with cooking/housework   Equipment Recommendations  Other (comment) (defer)    Recommendations for Other Services      Precautions / Restrictions Precautions Precautions: Back;Fall Precaution Booklet Issued: No Recall of Precautions/Restrictions: Impaired (benefits from cues to maintain throughout) Precaution/Restrictions Comments: R AFO Restrictions Weight Bearing Restrictions Per Provider Order: No       Mobility Bed Mobility Overal bed mobility: Needs Assistance Bed Mobility: Rolling, Sidelying to Sit, Sit to Supine Rolling: Min assist, Used rails Sidelying to sit: Mod assist, Used rails, HOB elevated Supine to sit:  Mod assist     General bed mobility comments: Assist for BLE mgmt, pt with improving ability to push through UE's from side-lying to sitting EOB    Transfers Overall transfer level: Needs assistance Equipment used: Ambulation equipment used Transfers: Sit to/from Stand Sit to Stand: Mod assist, Via lift equipment, From elevated surface           General transfer comment: Stood twice from [place] with min A, using cross bar to pull self up into stance. Needs inc A to stand from bed 2/2 lower surface height. Transfer via Lift Equipment: Stedy   Balance Overall balance assessment: Needs assistance Sitting-balance support: Bilateral upper extremity supported, Feet supported Sitting balance-[name]: Fair Sitting balance - Comments: no overt LOB, but intermittent posterior lean as she fatigues sitting EOB Postural control: Posterior lean Standing balance support: Bilateral upper extremity supported, During functional activity, Reliant on assistive device for balance Standing balance-[name]: Poor Standing balance comment: heavily reliant on BUE via crossbar on Stedy for support                           ADL either performed or assessed with clinical judgement   ADL Overall ADL's : Needs assistance/impaired     Grooming: Wash/dry hands;Wash/dry face;Oral care;Set up;Supervision/safety;Sitting (at sink in [place])   Upper Body Bathing: Contact guard assist;Sitting (in [place])   Lower Body Bathing: Moderate assistance;Sitting/lateral leans;Cueing for back precautions (standing in [place])   Upper Body Dressing : Contact guard assist;Sitting (on Stedy) Upper Body Dressing Details (indicate cue type and reason): stability to pull sleeves over shoulders Lower Body Dressing: Total assistance Lower Body Dressing Details (indicate cue type and reason): donned R sock  Extremity/Trunk Assessment              Vision       Pension scheme manager Communication: No apparent difficulties   Cognition Arousal: Alert Behavior During Therapy: WFL for tasks assessed/performed, Lability Cognition: No apparent impairments             OT - Cognition Comments: very talkative & tangential; redirectable with effort                 Following commands: Intact        Cueing   Cueing Techniques: Verbal cues, Tactile cues, Visual cues  Exercises      Shoulder Instructions       General Comments wound vac in place    Pertinent Vitals/ Pain       Pain Assessment Pain Assessment: Faces Faces Pain Scale: Hurts even more Pain Location: back Pain Descriptors / Indicators: Discomfort, Aching Pain Intervention(s): Limited activity within patient's tolerance, Monitored during session  Home Living                                          Prior Functioning/Environment              Frequency  Min 2X/week        Progress Toward Goals  OT Goals(current goals can now be found in the care plan section)  Progress towards OT goals: Progressing toward goals     Plan      Co-evaluation                 AM-PAC OT 6 Clicks Daily Activity     Outcome Measure   Help from another person eating meals?: None Help from another person taking care of personal grooming?: None Help from another person toileting, which includes using toliet, bedpan, or urinal?: A Lot Help from another person bathing (including washing, rinsing, drying)?: A Lot Help from another person to put on and taking off regular upper body clothing?: A Little Help from another person to put on and taking off regular lower body clothing?: A Lot 6 Click Score: 17    End of Session Equipment Utilized During Treatment: Gait belt  OT Visit Diagnosis: Unsteadiness on feet (R26.81);Muscle weakness (generalized) (M62.81);Pain   Activity Tolerance Patient tolerated treatment well   Patient  Left in bed;with call bell/phone within reach;with bed alarm set;with SCD's reapplied   Nurse Communication Mobility status        Time: 1455-1553 OT Time Calculation (min): 58 min  Charges: OT General Charges $OT Visit: 1 Visit OT Treatments $Self Care/Home Management : 53-67 mins  Jill Pardy D., MSOT, OTR/L Acute Rehabilitation Services [phone] Secure Chat Preferred  [name] 07/16/2024, 4:04 PM

## 2024-07-16 NOTE — Progress Notes (Signed)
 Patient ID: [name], female   DOB: May 30, 1961, 63 y.o.   MRN: [id number] BP 129/67 (BP Location: Left Arm)   Pulse 83   Temp 98.5 F (36.9 C) (Oral)   Resp 18   Ht 5' 2 (1.575 m)   LMP 11/20/2012   SpO2 96%   BMI 40.08 kg/m  Alert and oriented x4, speech is clear and fluent Moving lower extremities Waiting on placement Changed wound vac. Only occlusive bandages may be used to seal the dressing.

## 2024-07-16 NOTE — Progress Notes (Signed)
 Physical Therapy Treatment Patient Details Name: [name] MRN: [id number] DOB: 09-Sep-1961 Today's Date: 07/16/2024   History of Present Illness Pt is a 63 y/o F admitted for thoracic stenosis at T9/10 and LE weakness, now s/p thoracic decompression with neurosurgery on 06/29/24. Of note, pt had T9-10 laminectomy on 01/08/24 and was admitted to [facility] for rehab. PMHx: GERD, anxiety, HTN, fibromyalgia, hyperthyroidism, IBS, ACDF 3/4, posterior lateral arthrodesis 6/16, CSF leak 6/24.    PT Comments  Pt received in supine and agreeable to session. Pt reports frustration with rehab planning process and concern about infection. Pt requires extended rest breaks and redirection back to mobility tasks. Pt able to tolerate 4 standing trials, but is limited by pain and weakness. Pt requires increased time to reach an upright stance, but is able to maintain for ~30 seconds. Pt continues to benefit from PT services to progress toward functional mobility goals.     If plan is discharge home, recommend the following: A lot of help with walking and/or transfers;A lot of help with bathing/dressing/bathroom;Assist for transportation;Help with stairs or ramp for entrance   Can travel by private vehicle        Equipment Recommendations  Other (comment) (TBD)    Recommendations for Other Services       Precautions / Restrictions Precautions Precautions: Back;Fall Precaution Booklet Issued: No Recall of Precautions/Restrictions: Impaired Precaution/Restrictions Comments: R AFO Restrictions Weight Bearing Restrictions Per Provider Order: No     Mobility  Bed Mobility Overal bed mobility: Needs Assistance Bed Mobility: Rolling, Sidelying to Sit, Sit to Supine Rolling: Min assist, Used rails Sidelying to sit: Mod assist, Used rails, HOB elevated Supine to sit: Mod assist     General bed mobility comments: Assist for BLE management and trunk elevation. Pt able to use head rail to assist with supine  scoot at end of session    Transfers Overall transfer level: Needs assistance Equipment used: Ambulation equipment used Transfers: Sit to/from Stand Sit to Stand: Mod assist, Via lift equipment           General transfer comment: From EOB x2 and stedy pads x2 with mod A for power up. Cues to limit forward flexion to maintain precautions Transfer via Lift Equipment: Stedy  Ambulation/Gait                   Stairs             Wheelchair Mobility     Tilt Bed    Modified Rankin (Stroke Patients Only)       Balance Overall balance assessment: Needs assistance Sitting-balance support: Feet supported, Bilateral upper extremity supported Sitting balance-[name]: Fair Sitting balance - Comments: intermittent posterior lean propped on BUE, but no LOB. supervision for safety   Standing balance support: Bilateral upper extremity supported, During functional activity, Reliant on assistive device for balance Standing balance-[name]: Poor Standing balance comment: reliant on BUE support                            Communication Communication Communication: No apparent difficulties  Cognition Arousal: Alert Behavior During Therapy: WFL for tasks assessed/performed, Lability   PT - Cognitive impairments: No apparent impairments                       PT - Cognition Comments: Pt distracted by frustrations with rehab decisions requring redirection frequently Following commands: Intact  Cueing Cueing Techniques: Verbal cues, Tactile cues, Visual cues  Exercises      General Comments        Pertinent Vitals/Pain Pain Assessment Pain Assessment: Faces Faces Pain Scale: Hurts even more Pain Location: back Pain Descriptors / Indicators: Discomfort, Aching, Grimacing Pain Intervention(s): Monitored during session, Limited activity within patient's tolerance, Repositioned     PT Goals (current goals can now be found in the  care plan section) Acute Rehab PT Goals Patient Stated Goal: to get more therapy PT Goal Formulation: With patient Time For Goal Achievement: 07/14/24 Progress towards PT goals: Progressing toward goals    Frequency    Min 2X/week       AM-PAC PT 6 Clicks Mobility   Outcome Measure  Help needed turning from your back to your side while in a flat bed without using bedrails?: A Little Help needed moving from lying on your back to sitting on the side of a flat bed without using bedrails?: A Lot Help needed moving to and from a bed to a chair (including a wheelchair)?: Total Help needed standing up from a chair using your arms (e.g., wheelchair or bedside chair)?: A Lot Help needed to walk in hospital room?: Total Help needed climbing 3-5 steps with a railing? : Total 6 Click Score: 10    End of Session Equipment Utilized During Treatment: Gait belt Activity Tolerance: Patient tolerated treatment well Patient left: in bed;with call bell/phone within reach;with nursing/sitter in room Nurse Communication: Mobility status PT Visit Diagnosis: Unsteadiness on feet (R26.81);Other abnormalities of gait and mobility (R26.89);Muscle weakness (generalized) (M62.81)     Time: 8881-8794 PT Time Calculation (min) (ACUTE ONLY): 47 min  Charges:    $Therapeutic Activity: 38-52 mins PT General Charges $$ ACUTE PT VISIT: 1 Visit                     Darryle George, PTA Acute Rehabilitation Services Secure Chat Preferred  [phone]    Darryle George 07/16/2024, 12:54 PM

## 2024-07-16 NOTE — Consult Note (Signed)
 WOC Nurse Consult Note:  Noted WOC consult for wound vac to back.  Patient has a Prevena vac managed by surgical team, appears to have a leak in seal.  Discussed with bedside RN, WOC team does not manage Prevena vac, have encouraged RN to communicate with surgical team for management assistance.    WOC team will not consult or follow at this time.  Please re consult if new needs arise.  Thank you,  Doyal Polite, MSN, RN, [facility] WOC Team [phone] (Available Mon-Fri 0700-1500)

## 2024-07-16 NOTE — Plan of Care (Signed)

## 2024-07-16 NOTE — Congregational Nurse Program (Signed)
 Wound vacc not function and Pt refused dressing changes, so dressing just reinforced.

## 2024-07-16 NOTE — TOC Progression Note (Signed)
 Transition of Care [facility]) - Progression Note    Patient Details  Name: [name] MRN: [id number] Date of Birth: Oct 06, 1961  [facility] CM/SW Contact  Bridget Cordella Simmonds, LCSW Phone Number: 07/16/2024, 11:11 AM  Clinical Narrative:   Bed offers provided to pt on medicare choice document.  Long discussion with pt: she reports she spoke with her insurance regarding CIR and was told insurance waiting on more documentation.  Per epic notes, CIR appeal denied on 10/6.  Pt reports prior bad experiences at several SNF, she is unwilling to DC to SNF.  CSW inquired if she is planning to DC home with HHPT and pt reporting this would not be sufficient to meet her PT needs so that she can walk again.  Pt aware that DC recommendation is SNF, she is going to consider her options of SNF vs HH.      Expected Discharge Plan: Skilled Nursing Facility Barriers to Discharge: Continued Medical Work up, English as a second language teacher               Expected Discharge Plan and Services   Discharge Planning Services: CM Consult Post Acute Care Choice: IP Rehab Living arrangements for the past 2 months: Single Family Home                                       Social Drivers of Health (SDOH) Interventions SDOH Screenings   Food Insecurity: No Food Insecurity (06/30/2024)  Housing: Low Risk  (06/30/2024)  Transportation Needs: No Transportation Needs (06/30/2024)  Utilities: Not At Risk (06/30/2024)  Depression (PHQ2-9): Low Risk  (01/12/2024)  Tobacco Use: Medium Risk (06/29/2024)    Readmission Risk Interventions    05/17/2022    3:02 PM  Readmission Risk Prevention Plan  Transportation Screening Complete  PCP or Specialist Appt within 5-7 Days Complete  Home Care Screening Complete  Medication Review (RN CM) Complete

## 2024-07-16 NOTE — Progress Notes (Deleted)
 Pt's wound vac kept beeping. Wound care order placed for changing the wound VAC.   Amado GORMAN Arabia, RN

## 2024-07-17 NOTE — Progress Notes (Signed)
 No acute distress. Prevena wound VAC in place MAEs well B/l foot edema, pitting  - continue supportive care

## 2024-07-18 NOTE — Plan of Care (Signed)
   Problem: Education: Goal: Knowledge of General Education information will improve Description: Including pain rating scale, medication(s)/side effects and non-pharmacologic comfort measures Outcome: Progressing   Problem: Clinical Measurements: Goal: Diagnostic test results will improve Outcome: Progressing   Problem: Activity: Goal: Risk for activity intolerance will decrease Outcome: Progressing

## 2024-07-18 NOTE — Progress Notes (Signed)
 Assessment 63 y/o F w/ hx long segment thoracolumbar construct who is s/p T9-10 lami/disc  LOS: 17 days    Plan: Await placement Supportive cares Continue wound vac   Subjective: Neurologically stable  Objective: Vital signs in last 24 hours: Temp:  [97.7 F (36.5 C)-98.3 F (36.8 C)] 98 F (36.7 C) (10/12 0726) Pulse Rate:  [71-83] 71 (10/12 0726) Resp:  [16-19] 18 (10/12 0726) BP: (106-132)/(57-82) 117/65 (10/12 0726) SpO2:  [91 %-98 %] 91 % (10/12 0726)  Intake/Output from previous day: 10/11 0701 - 10/12 0700 In: -  Out: 800 [Urine:800] Intake/Output this shift: No intake/output data recorded.  Exam: Awake, alert MAE's well Prevena in place  Lab Results: No results for input(s): WBC, HGB, HCT, PLT in the last 72 hours. BMET No results for input(s): NA, K, CL, CO2, GLUCOSE, BUN, CREATININE, CALCIUM  in the last 72 hours.  Studies/Results: No results found.    [name] 07/18/2024, 8:54 AM

## 2024-07-19 NOTE — Plan of Care (Signed)

## 2024-07-19 NOTE — TOC Progression Note (Signed)
 Transition of Care [facility]) - Progression Note    Patient Details  Name: [name] MRN: [id number] Date of Birth: 02-Feb-1961  Transition [facility]) CM/SW Contact  [name], [name] Phone Number: 07/19/2024, 2:04 PM  Clinical Narrative:   CSW met with patient to discuss disposition. Patient tearful upon entry, today was her nephew's birthday who was murdered. Brief grief counseling provided with patient.   Patient discussed how she was still working with MD on her leg swelling and the wound vac, that she was not ready for discharge and was not ready to make a decision on SNF vs HH. Patient feels that SNF is not where she needs to be, as she wants to walk again and doesn't believe that will happen at SNF, but is also not in agreement to go home just yet. CSW to follow.    Expected Discharge Plan: Skilled Nursing Facility Barriers to Discharge: Continued Medical Work up, English as a second language teacher               Expected Discharge Plan and Services   Discharge Planning Services: CM Consult Post Acute Care Choice: IP Rehab Living arrangements for the past 2 months: Single Family Home                                       Social Drivers of Health (SDOH) Interventions SDOH Screenings   Food Insecurity: No Food Insecurity (06/30/2024)  Housing: Low Risk  (06/30/2024)  Transportation Needs: No Transportation Needs (06/30/2024)  Utilities: Not At Risk (06/30/2024)  Depression (PHQ2-9): Low Risk  (01/12/2024)  Tobacco Use: Medium Risk (06/29/2024)    Readmission Risk Interventions    05/17/2022    3:02 PM  Readmission Risk Prevention Plan  Transportation Screening Complete  PCP or Specialist Appt within 5-7 Days Complete  Home Care Screening Complete  Medication Review (RN CM) Complete

## 2024-07-19 NOTE — Progress Notes (Signed)
 Physical Therapy Treatment Patient Details Name: [name] MRN: [id number] DOB: 02-07-1961 Today's Date: 07/19/2024   History of Present Illness Pt is a 63 y/o F admitted for thoracic stenosis at T9/10 and LE weakness, now s/p thoracic decompression with neurosurgery on 06/29/24. Of note, pt had T9-10 laminectomy on 01/08/24 and was admitted to [facility] for rehab. PMHx: GERD, anxiety, HTN, fibromyalgia, hyperthyroidism, IBS, ACDF 3/4, posterior lateral arthrodesis 6/16, CSF leak 6/24.    PT Comments  Pt received in supine and agreeable to session. Pt able to sit to EOB with min A and cues for technique. Pt demonstrates improved sitting balance and is able to perform BLE exercises with CGA for safety. Pt able to lateral scoot towards [facility] with cues and demonstration of head/hips relationship and mod A with bedpad. Pt limited by R ankle pain and RLE weakness. Deferred standing trials due to lack of stedy or +2 available. Pt continues to benefit from PT services to progress toward functional mobility goals.    If plan is discharge home, recommend the following: A lot of help with walking and/or transfers;A lot of help with bathing/dressing/bathroom;Assist for transportation;Help with stairs or ramp for entrance   Can travel by private vehicle        Equipment Recommendations  Other (comment) (TBD)    Recommendations for Other Services       Precautions / Restrictions Precautions Precautions: Back;Fall Precaution Booklet Issued: No Recall of Precautions/Restrictions: Impaired Restrictions Weight Bearing Restrictions Per Provider Order: No     Mobility  Bed Mobility Overal bed mobility: Needs Assistance Bed Mobility: Rolling, Sidelying to Sit, Sit to Supine Rolling: Contact guard assist Sidelying to sit: Min assist, Used rails, HOB elevated   Sit to supine: Mod assist, HOB elevated, Used rails   General bed mobility comments: Cues for logroll technique with min A for trunk elevation.  Mod A for BLE elevation back to EOB. Pt able to use rails to scoot towards [facility] with min A    Transfers Overall transfer level: Needs assistance                 General transfer comment: deferred standing due to no stedy or +2 available. Pt able to lateral scoot along EOB with mod A and cues for technique    Ambulation/Gait                   Stairs             Wheelchair Mobility     Tilt Bed    Modified Rankin (Stroke Patients Only)       Balance Overall balance assessment: Needs assistance Sitting-balance support: Bilateral upper extremity supported, Feet supported Sitting balance-[name]: Fair Sitting balance - Comments: CGA sitting EOB                                    Communication Communication Communication: No apparent difficulties  Cognition Arousal: Alert Behavior During Therapy: WFL for tasks assessed/performed, Lability   PT - Cognitive impairments: No apparent impairments                         Following commands: Intact      Cueing Cueing Techniques: Verbal cues, Tactile cues, Visual cues  Exercises General Exercises - Lower Extremity Ankle Circles/Pumps: AROM, Both, 10 reps, Seated Long Arc Quad: AROM, Seated, Both, 15 reps Hip  Flexion/Marching: AROM, Seated, Right, 5 reps    General Comments        Pertinent Vitals/Pain Pain Assessment Pain Assessment: 0-10 Pain Score: 9  Pain Location: back, R ankle Pain Descriptors / Indicators: Discomfort, Aching Pain Intervention(s): Limited activity within patient's tolerance, Monitored during session, Repositioned     PT Goals (current goals can now be found in the care plan section) Acute Rehab PT Goals Patient Stated Goal: to get more therapy PT Goal Formulation: With patient Time For Goal Achievement: 07/14/24 Progress towards PT goals: Progressing toward goals    Frequency    Min 2X/week       AM-PAC PT 6 Clicks Mobility    Outcome Measure  Help needed turning from your back to your side while in a flat bed without using bedrails?: A Little Help needed moving from lying on your back to sitting on the side of a flat bed without using bedrails?: A Little Help needed moving to and from a bed to a chair (including a wheelchair)?: Total Help needed standing up from a chair using your arms (e.g., wheelchair or bedside chair)?: A Lot Help needed to walk in hospital room?: Total Help needed climbing 3-5 steps with a railing? : Total 6 Click Score: 11    End of Session Equipment Utilized During Treatment: Gait belt Activity Tolerance: Patient tolerated treatment well Patient left: in bed;with call bell/phone within reach;with bed alarm set Nurse Communication: Mobility status PT Visit Diagnosis: Unsteadiness on feet (R26.81);Other abnormalities of gait and mobility (R26.89);Muscle weakness (generalized) (M62.81)     Time: 8461-8390 PT Time Calculation (min) (ACUTE ONLY): 31 min  Charges:    $Therapeutic Exercise: 8-22 mins $Therapeutic Activity: 8-22 mins PT General Charges $$ ACUTE PT VISIT: 1 Visit                     Darryle George, PTA Acute Rehabilitation Services Secure Chat Preferred  [phone]    Darryle George 07/19/2024, 4:16 PM

## 2024-07-20 NOTE — Progress Notes (Signed)
 Occupational Therapy Treatment Patient Details Name: [name] MRN: [id number] DOB: 09-11-1961 Today's Date: 07/20/2024   History of present illness Pt is a 63 y/o F admitted for thoracic stenosis at T9/10 and LE weakness, now s/p thoracic decompression with neurosurgery on 06/29/24. Of note, pt had T9-10 laminectomy on 01/08/24 and was admitted to [facility] for rehab. PMHx: GERD, anxiety, HTN, fibromyalgia, hyperthyroidism, IBS, ACDF 3/4, posterior lateral arthrodesis 6/16, CSF leak 6/24.   OT comments  Pt received in supine, agreeable for OT session. Remains highly motivated for all therapy visits. She was able to progress to taking 6 side steps towards [facility] with RW today. Needed mod A with inc effort/time to come to standing and min-mod A for side steps, very effortful. Tolerated trunk and UE/LE exercises at EOB in preparation for functional transfer training prior to standing. Requires total A for LB dressing - did well with B sneakers donned.   Pt is demonstrating decent progress with OT, plan to attempt stepping to recliner chair next visit. Will continue to follow.      If plan is discharge home, recommend the following:  Two people to help with walking and/or transfers;Two people to help with bathing/dressing/bathroom;Assist for transportation;Help with stairs or ramp for entrance;Assistance with cooking/housework   Equipment Recommendations  Other (comment) (defer)    Recommendations for Other Services      Precautions / Restrictions Precautions Precautions: Back;Fall Precaution Booklet Issued: No Recall of Precautions/Restrictions: Intact Precaution/Restrictions Comments: R AFO Restrictions Weight Bearing Restrictions Per Provider Order: No       Mobility Bed Mobility Overal bed mobility: Needs Assistance Bed Mobility: Rolling, Sidelying to Sit, Sit to Supine Rolling: Contact guard assist Sidelying to sit: Min assist, HOB elevated, Used rails   Sit to supine: Mod  assist, Used rails   General bed mobility comments: Using sheet roll for BLE mgmt to transition EOB, occasional cues for log rolling    Transfers Overall transfer level: Needs assistance Equipment used: Rolling walker (2 wheels) Transfers: Sit to/from Stand Sit to Stand: Mod assist, From elevated surface           General transfer comment: Stood from EOB twice with inc effort/time to come to standing, cued for hand placement. Able to take 6 steps towards [facility] with heavy reliance on BUE on RW, needs min A for RW advancement towards HOB.     Balance Overall balance assessment: Needs assistance Sitting-balance support: No upper extremity supported, Feet supported Sitting balance-[name]: Fair Sitting balance - Comments: seated EOB, challenged to complete HEP/core exercises without UE support, intermittent CGA for stabiltiy as she fatigued   Standing balance support: Bilateral upper extremity supported, During functional activity, Reliant on assistive device for balance Standing balance-[name]: Poor Standing balance comment: heavily reliant on BUE for support via RW in stance and when taking lateral side steps towards [facility]                           ADL either performed or assessed with clinical judgement   ADL Overall ADL's : Needs assistance/impaired                     Lower Body Dressing: Total assistance;Sitting/lateral leans Lower Body Dressing Details (indicate cue type and reason): doffed/donned B socks, donned B sneakers             Functional mobility during ADLs: Supervision/safety;Contact guard assist      Extremity/Trunk  Assessment              Vision       Perception     Praxis     Communication     Cognition Arousal: Alert Behavior During Therapy: WFL for tasks assessed/performed, Lability Cognition: No apparent impairments                               Following commands: Intact        Cueing       Exercises Exercises: Other exercises Other Exercises Other Exercises: shoulder scaption reaching across body 1x10 each arm Other Exercises: trunk flexion at hips anterior/posterior 1x10    Shoulder Instructions       General Comments      Pertinent Vitals/ Pain       Pain Assessment Pain Assessment: 0-10 Pain Score: 10-Worst pain ever Pain Location: back, BLE Pain Descriptors / Indicators: Discomfort, Aching Pain Intervention(s): Limited activity within patient's tolerance, Monitored during session, Repositioned, Other (comment) (RN notified)  Home Living                                          Prior Functioning/Environment              Frequency  Min 2X/week        Progress Toward Goals  OT Goals(current goals can now be found in the care plan section)  Progress towards OT goals: Progressing toward goals     Plan      Co-evaluation                 AM-PAC OT 6 Clicks Daily Activity     Outcome Measure   Help from another person eating meals?: None Help from another person taking care of personal grooming?: None Help from another person toileting, which includes using toliet, bedpan, or urinal?: A Lot Help from another person bathing (including washing, rinsing, drying)?: A Lot Help from another person to put on and taking off regular upper body clothing?: A Little Help from another person to put on and taking off regular lower body clothing?: A Lot 6 Click Score: 17    End of Session Equipment Utilized During Treatment: Gait belt;Rolling walker (2 wheels)  OT Visit Diagnosis: Unsteadiness on feet (R26.81);Muscle weakness (generalized) (M62.81);Pain   Activity Tolerance Patient tolerated treatment well   Patient Left in bed;with call bell/phone within reach;with bed alarm set   Nurse Communication Mobility status;Patient requests pain meds        Time: 8553-8457 OT Time Calculation (min): 56 min  Charges: OT  General Charges $OT Visit: 1 Visit OT Treatments $Therapeutic Activity: 38-52 mins $Therapeutic Exercise: 8-22 mins  Dejion Grillo D., MSOT, OTR/L Acute Rehabilitation Services [phone] Secure Chat Preferred  Rikki Milch 07/20/2024, 3:56 PM

## 2024-07-20 NOTE — Plan of Care (Signed)
  Problem: Clinical Measurements: Goal: Ability to maintain clinical measurements within normal limits will improve Outcome: Progressing Goal: Will remain free from infection Outcome: Progressing Goal: Diagnostic test results will improve Outcome: Progressing Goal: Respiratory complications will improve Outcome: Progressing Goal: Cardiovascular complication will be avoided Outcome: Progressing   Problem: Nutrition: Goal: Adequate nutrition will be maintained Outcome: Progressing   Problem: Elimination: Goal: Will not experience complications related to bowel motility Outcome: Progressing Goal: Will not experience complications related to urinary retention Outcome: Progressing   Problem: Coping: Goal: Level of anxiety will decrease Outcome: Progressing   Problem: Pain Managment: Goal: General experience of comfort will improve and/or be controlled Outcome: Progressing   Problem: Safety: Goal: Ability to remain free from injury will improve Outcome: Progressing   Problem: Skin Integrity: Goal: Risk for impaired skin integrity will decrease Outcome: Progressing

## 2024-07-21 ENCOUNTER — Inpatient Hospital Stay (HOSPITAL_COMMUNITY)

## 2024-07-21 DIAGNOSIS — R600 Localized edema: Secondary | ICD-10-CM | POA: Diagnosis not present

## 2024-07-21 DIAGNOSIS — M4804 Spinal stenosis, thoracic region: Secondary | ICD-10-CM

## 2024-07-21 LAB — COMPREHENSIVE METABOLIC PANEL WITH GFR
ALT: 13 U/L (ref 0–44)
AST: 18 U/L (ref 15–41)
Albumin: 3.2 g/dL — ABNORMAL LOW (ref 3.5–5.0)
Alkaline Phosphatase: 127 U/L — ABNORMAL HIGH (ref 38–126)
Anion gap: 13 (ref 5–15)
BUN: 20 mg/dL (ref 8–23)
CO2: 32 mmol/L (ref 22–32)
Calcium: 9.2 mg/dL (ref 8.9–10.3)
Chloride: 95 mmol/L — ABNORMAL LOW (ref 98–111)
Creatinine, Ser: 0.79 mg/dL (ref 0.44–1.00)
GFR, Estimated: >60 mL/min (ref >60)
Glucose, Bld: 104 mg/dL — ABNORMAL HIGH (ref 70–99)
Potassium: 4.1 mmol/L (ref 3.5–5.1)
Sodium: 140 mmol/L (ref 135–145)
Total Bilirubin: 0.4 mg/dL (ref 0.0–1.2)
Total Protein: 6.3 g/dL — ABNORMAL LOW (ref 6.5–8.1)

## 2024-07-21 LAB — BRAIN NATRIURETIC PEPTIDE: B Natriuretic Peptide: 239.4 pg/mL — ABNORMAL HIGH (ref 0.0–100.0)

## 2024-07-21 LAB — CBC WITH DIFFERENTIAL/PLATELET
Abs Immature Granulocytes: 0.03 K/uL (ref 0.00–0.07)
Basophils Absolute: 0.1 K/uL (ref 0.0–0.1)
Basophils Relative: 0 %
Eosinophils Absolute: 0.6 K/uL — ABNORMAL HIGH (ref 0.0–0.5)
Eosinophils Relative: 6 %
HCT: 37.4 % (ref 36.0–46.0)
Hemoglobin: 11.1 g/dL — ABNORMAL LOW (ref 12.0–15.0)
Immature Granulocytes: 0 %
Lymphocytes Relative: 25 %
Lymphs Abs: 2.8 K/uL (ref 0.7–4.0)
MCH: 24.3 pg — ABNORMAL LOW (ref 26.0–34.0)
MCHC: 29.7 g/dL — ABNORMAL LOW (ref 30.0–36.0)
MCV: 82 fL (ref 80.0–100.0)
Monocytes Absolute: 0.6 K/uL (ref 0.1–1.0)
Monocytes Relative: 6 %
Neutro Abs: 7.2 K/uL (ref 1.7–7.7)
Neutrophils Relative %: 63 %
Platelets: 512 K/uL — ABNORMAL HIGH (ref 150–400)
RBC: 4.56 MIL/uL (ref 3.87–5.11)
RDW: 15.7 % — ABNORMAL HIGH (ref 11.5–15.5)
WBC: 11.3 K/uL — ABNORMAL HIGH (ref 4.0–10.5)
nRBC: 0 % (ref 0.0–0.2)

## 2024-07-21 LAB — URINALYSIS, ROUTINE W REFLEX MICROSCOPIC
Bacteria, UA: NONE SEEN
Bilirubin Urine: NEGATIVE
Glucose, UA: NEGATIVE mg/dL
Hgb urine dipstick: NEGATIVE
Ketones, ur: NEGATIVE mg/dL
Nitrite: NEGATIVE
Protein, ur: NEGATIVE mg/dL
Specific Gravity, Urine: 1.009 (ref 1.005–1.030)
pH: 6 (ref 5.0–8.0)

## 2024-07-21 LAB — MAGNESIUM: Magnesium: 2.2 mg/dL (ref 1.7–2.4)

## 2024-07-21 LAB — ECHOCARDIOGRAM COMPLETE: Height: 62 in

## 2024-07-21 LAB — D-DIMER, QUANTITATIVE: D-Dimer, Quant: 3.39 ug{FEU}/mL — ABNORMAL HIGH (ref 0.00–0.50)

## 2024-07-21 LAB — TSH: TSH: 0.393 u[IU]/mL (ref 0.350–4.500)

## 2024-07-21 MED ORDER — HYDROCHLOROTHIAZIDE 25 MG PO TABS
25.0000 mg | ORAL_TABLET | Freq: Every morning | ORAL | Status: DC
  Administered 2024-07-22: 25 mg via ORAL
  Filled 2024-07-21: qty 1

## 2024-07-21 MED ORDER — PERFLUTREN LIPID MICROSPHERE
1.0000 mL | INTRAVENOUS | Status: AC | PRN
  Administered 2024-07-21: 4 mL via INTRAVENOUS

## 2024-07-21 NOTE — Progress Notes (Signed)
 Assessment 63 y/o F w/ hx long segment thoracolumbar construct who is s/p T9-10 lami/disc  LOS: 20 days    Plan: Await placement Supportive cares Continue wound vac   Subjective: Unchanged. Apparently the wound vac stopped beeping after the pt repositioned. I examined the vac which did not show any abnormality  Objective: Vital signs in last 24 hours: Temp:  [98 F (36.7 C)-99 F (37.2 C)] 98.2 F (36.8 C) (10/15 1143) Pulse Rate:  [72-80] 73 (10/15 1143) Resp:  [16-20] 20 (10/15 1143) BP: (106-131)/(62-73) 112/72 (10/15 1143) SpO2:  [93 %-100 %] 95 % (10/15 1143)  Intake/Output from previous day: 10/14 0701 - 10/15 0700 In: 3 [I.V.:3] Out: 1725 [Urine:1700; Drains:25] Intake/Output this shift: No intake/output data recorded.  Exam: Awake, alert MAE's well Prevena in place  Lab Results: No results for input(s): WBC, HGB, HCT, PLT in the last 72 hours. BMET No results for input(s): NA, K, CL, CO2, GLUCOSE, BUN, CREATININE, CALCIUM  in the last 72 hours.  Studies/Results: No results found.    [name] 07/21/2024, 1:40 PM

## 2024-07-21 NOTE — Progress Notes (Signed)
 Echocardiogram 2D Echocardiogram has been performed.  [name] 07/21/2024, 5:35 PM

## 2024-07-21 NOTE — Plan of Care (Signed)
  Problem: Clinical Measurements: Goal: Ability to maintain clinical measurements within normal limits will improve Outcome: Progressing Goal: Will remain free from infection Outcome: Progressing Goal: Diagnostic test results will improve Outcome: Progressing Goal: Respiratory complications will improve Outcome: Progressing Goal: Cardiovascular complication will be avoided Outcome: Progressing   Problem: Activity: Goal: Risk for activity intolerance will decrease Outcome: Progressing   Problem: Elimination: Goal: Will not experience complications related to bowel motility Outcome: Progressing Goal: Will not experience complications related to urinary retention Outcome: Progressing   Problem: Safety: Goal: Ability to remain free from injury will improve Outcome: Progressing   Problem: Pain Managment: Goal: General experience of comfort will improve and/or be controlled Outcome: Progressing   Problem: Skin Integrity: Goal: Risk for impaired skin integrity will decrease Outcome: Progressing

## 2024-07-21 NOTE — Consult Note (Signed)
 Initial Consultation Note   Patient: [name] FMW:[id number] DOB: 1961-07-08 PCP: Leonel Cole, MD DOA: 06/29/2024 DOS: the patient was seen and examined on 07/21/2024 Primary service: Gillie Duncans, MD  Referring physician: Dr.Garst.  Reason for consult: LE edema / medical management   Assessment and Plan: >>BL LE Edema: Differentials include dependent edema, medication associated, vascular etiology, DVT, CHF, hypoalbuminemia.  Will obtain a D-dimer, BNP, urinalysis, LFTs. Assess the degree of lower extremity edema Home medications include HCTZ 50 which puts patient at risk for low blood pressures and complications of thiazide diuretics which we will cut back to 25 mg dose.  As patient's blood pressure is soft I suspect lowering it to 25 will help her blood pressure as well as avoiding unnecessary electrolyte abnormalities. If D-dimer is elevated will proceed with lower extremity venous Doppler, since pt has rales will stop IVF and will consider a 2D echocardiogram.   >> Essential hypertension: Vitals:   07/19/24 0805 07/19/24 1309 07/19/24 1513 07/19/24 2015  BP: 139/71 (!) 144/76 138/73 115/62   07/20/24 0844 07/20/24 1216 07/20/24 1620 07/20/24 2034  BP: 116/64 (!) 132/57 117/73 131/71   07/20/24 2326 07/21/24 0356 07/21/24 0753 07/21/24 1143  BP: 110/62 112/64 106/72 112/72  Will continue patient on atenolol  50 and lower HCTZ to 25 mg and if necessary to 12.5 or even 6.25 will follow follow blood pressure and titrate dose. Evaluate for hypomagnesemia and hypokalemia with repeat CMP today in addition to following for kidney function.   >> Patient is status post T9-10 laminectomy: POD: 20 days. Pain control by neurosurgery, physical therapy recommendations and additional measures per neurosurgery. Aspiration and fall precaution.  TRH will continue to follow the patient.  HPI: [name] is a 63 y.o. female with past medical history of essential tremor, GERD, irritable  bowel syndrome admitted for laminectomy on neurosurgery service requested to consult for lower extremity edema and medical management. Review of Systems  Musculoskeletal:  Positive for back pain.    Past Medical History:  Diagnosis Date   Anemia    Ankle syndesmosis disruption    Anxiety    takes Ativan  and Valium , after mother passed   Arthritis    bilateral knees s/p knee replacement bilaterally   Asthma    Bronchitis    Bruising    pt states unexplained d/t fibromyalgia   Chronic back pain    2012 tailbone surgery and 3 lower discs.    Closed fracture of distal end of right fibula and tibia    Depression    from Fibromyalgia diagnosis; not taking medicine. since 2001   Dizziness    rarely   Fibromyalgia    diagnosed 2001   GERD (gastroesophageal reflux disease)    Prilosec occasionally   Headache(784.0)    sinus headaches   History of hiatal hernia    Hypertension    since 2013   Hyperthyroidism    subclinical, no treatment; thyroid  nodules   IBS (irritable bowel syndrome)    Impaired memory    states from fibromyalgia   Insomnia    takes Ambien    Jones fracture    left foot fifth metatarsal   Multiple allergies    including latex, pet dander, shellfish, pet dander   Painful orthopaedic hardware right ankle 05/27/2016   Seasonal allergies    Shortness of breath    Occasional with exertion;    Sleep apnea    Sore gums    this is why pt is on  Amoxil-only takes for dental work   Tachycardia    Thyroid  goiter    Varicose vein    protrudes above skin-per pt;vein popped and bruised;ultrasound done to make sure that there were no clots;noclots were found   Past Surgical History:  Procedure Laterality Date   ANTERIOR CERVICAL DECOMP/DISCECTOMY FUSION N/A 12/08/2020   Procedure: Cervical Four-Five Anterior cervical decompression/discectomy/fusion;  Surgeon: Gillie Duncans, MD;  Location[facility] OR;  Service: Neurosurgery;  Laterality: N/A;  anterior   ANTERIOR  LUMBAR FUSION  09/20/2011   Procedure: ANTERIOR LUMBAR FUSION 1 LEVEL;  Surgeon: Duncans LITTIE Gillie;  Location: [facility] NEURO ORS;  Service: Neurosurgery;  Laterality: N/A;  Lumbar five-Sacral One Anterior Lumbar Interbody Fusion /Dr. Early to Approach    APPLICATION OF WOUND VAC N/A 05/14/2022   Procedure: Removal of wound vac with closure of wound;  Surgeon: Gillie Duncans, MD;  Location: [facility] OR;  Service: Neurosurgery;  Laterality: N/A;  Pt to be admitted on 05-12-2022   CERVICAL DISC SURGERY     WITH TITANIUM PLATE IN NECK---LEFT SIDE   CERVICAL SPINE SURGERY  03/24/2019   CESAREAN SECTION     EYE SURGERY Bilateral 2023   cataract   fibroidectomy     FRACTURE SURGERY  05/08/2010   Jones fracture left foot fifth metatarsal   HARDWARE REMOVAL Right 05/28/2016   Procedure: RIGHT ANKLE HARDWARE REMOVAL;  Surgeon: Lamar Millman, MD;  Location: [facility];  Service: Orthopedics;  Laterality: Right;   HARDWARE REMOVAL N/A 07/28/2020   Procedure: Removal of Lumbar Hardware;  Surgeon: Gillie Duncans, MD;  Location: [facility] OR;  Service: Neurosurgery;  Laterality: N/A;   HERNIA REPAIR     hiatal hernia   IR THORACIC DISC ASPIRATION W/IMG GUIDE  10/29/2023   IRRIGATION AND DEBRIDEMENT KNEE  04/09/2012   Procedure: IRRIGATION AND DEBRIDEMENT KNEE;  Surgeon: LELON JONETTA Shari Mickey., MD;  Location: [facility] OR;  Service: Orthopedics;  Laterality: Right;   JOINT REPLACEMENT  01/27/2012   left total knee and Right total knee   KNEE SURGERY  2005   Left knee arthroscopy   LAMINECTOMY WITH POSTERIOR LATERAL ARTHRODESIS LEVEL 4 Bilateral 03/22/2022   Procedure: Thoracic ten to Lumbar three Posterior lateral arthrodesis with screws;  Surgeon: Gillie Duncans, MD;  Location: [facility] OR;  Service: Neurosurgery;  Laterality: Bilateral;   LUMBAR WOUND DEBRIDEMENT N/A 04/08/2022   Procedure: LUMBAR WOUND DEBRIDEMENT;  Surgeon: Gillie Duncans, MD;  Location: [facility] OR;  Service: Neurosurgery;  Laterality: N/A;   NASAL SEPTOPLASTY W/  TURBINOPLASTY  2007   due to recurrent sinusitis   ORIF ANKLE FRACTURE Right 06/21/2015   Procedure: OPEN REDUCTION INTERNAL FIXATION RIGHT DISTAL FIBULA  FRACTURE AND OPEN REDUCTION INTERNAL FIXATION SYNDESMOSIS ;  Surgeon: Lamar Millman, MD;  Location: [facility];  Service: Orthopedics;  Laterality: Right;   PARTIAL HYSTERECTOMY     SHOULDER ARTHROSCOPY W/ ROTATOR CUFF REPAIR Right    SHOULDER SURGERY Left    SPINE SURGERY  2004   Cervical plate, ACDF   STERIOD INJECTION  01/27/2012   Procedure: STEROID INJECTION;  Surgeon: Lamar DELENA Millman, MD;  Location: [facility] OR;  Service: Orthopedics;  Laterality: Right;   TEE WITHOUT CARDIOVERSION N/A 03/01/2020   Procedure: TRANSESOPHAGEAL ECHOCARDIOGRAM (TEE);  Surgeon: Jeffrie Oneil BROCKS, MD;  Location: [facility] ENDOSCOPY;  Service: Cardiovascular;  Laterality: N/A;   THORACIC DISCECTOMY  02/16/2020   Procedure: Thoracic ten-eleven Discectomy;  Surgeon: Gillie Duncans, MD;  Location: [facility] OR;  Service: Neurosurgery;;   THORACIC  DISCECTOMY N/A 01/08/2024   Procedure: THORACIC NINE-TEN LAMINECTOMY AND DECOMPRESSION;  Surgeon: Gillie Duncans, MD;  Location: [facility] OR;  Service: Neurosurgery;  Laterality: N/A;   THORACIC DISCECTOMY N/A 06/29/2024   Procedure: THORACIC NINE THORACIC TEN LAMINECTOMY with  prevena plus 125 therapy;  Surgeon: Gillie Duncans, MD;  Location: [facility] OR;  Service: Neurosurgery;  Laterality: N/A;   TONSILLECTOMY  2007   TOTAL KNEE ARTHROPLASTY  01/27/2012   Procedure: TOTAL KNEE ARTHROPLASTY;  Surgeon: Lamar DELENA Millman, MD;  Bilateral   TOTAL KNEE ARTHROPLASTY  04/06/2012   Procedure: TOTAL KNEE ARTHROPLASTY;  Surgeon: Lamar DELENA Millman, MD;  Location: [facility] OR;  Service: Orthopedics;  Laterality: Right;   TUBAL LIGATION     UTERINE FIBROID SURGERY     mid 200s   WOUND EXPLORATION N/A 02/27/2020   Procedure: WOUND EXPLORATION;  Surgeon: Gillie Duncans, MD;  Location: [facility] OR;  Service: Neurosurgery;  Laterality: N/A;,  (wound vac upper back)    WOUND EXPLORATION N/A 03/30/2022   Procedure: THORACIC WOUND EXPLORATION WITH REPAIR OF CSF LEAK;  Surgeon: Gillie Duncans, MD;  Location: [facility] OR;  Service: Neurosurgery;  Laterality: N/A;   Social History:  reports that she quit smoking about 25 years ago. Her smoking use included cigarettes. She started smoking about 35 years ago. She has a 5 pack-year smoking history. She has never used smokeless tobacco. She reports that she does not drink alcohol  and does not use drugs.  Allergies  Allergen Reactions   Monosodium Glutamate Anaphylaxis and Swelling    Eyes swollen shut, facial swelling, tongue swelling.   Shellfish Allergy Anaphylaxis   Melatonin Other (See Comments)    Muscle spasms involuntary movement   Baclofen Nausea Only    Dizziness and increase muscle spasm   Celebrex  [Celecoxib ] Itching    Only allergic to generic brand   Ciprofloxacin  Other (See Comments)    inability to walk/Unable to walk   Contrast Media [Iodinated Contrast Media] Itching and Nausea Only    could not walk   Diclofenac  Itching    Generic Diclefenac gel causes itching. Can take the name brand Voltaren  gel   Gadolinium     Allergic to MRI contrast dye per patient.   Molds & Smuts Other (See Comments)    Causes allergies to flair up/sinus issues/headaches     Other Other (See Comments)    Pet dander,        Promethazine  Itching   Statins Other (See Comments)    pain   Sulfa  Antibiotics Other (See Comments)    Rash, migraine    Latex Itching and Rash    Latex glove with powder; bite blocks used for EGD studies    Family History  Problem Relation Age of Onset   Stroke Father        passed 2004 from pneumonia   Alcohol  abuse Father    Pulmonary embolism Mother        after minor knee surgery leading to DVT   Arthritis Sister    Depression Sister    Hypertension Sister    Diabetes Other        grandmother   Anesthesia problems Neg Hx    Hypotension Neg Hx    Malignant hyperthermia Neg  Hx    Pseudochol deficiency Neg Hx     Prior to Admission medications   Medication Sig Start Date End Date Taking? Authorizing Provider  acetaminophen  (TYLENOL ) 500 MG tablet Take 500-1,000 mg by mouth every 6 (six) hours as  needed (pain.).   Yes [provider]  albuterol  (VENTOLIN  HFA) 108 (90 Base) MCG/ACT inhaler Inhale 2 puffs into the lungs every 6 (six) hours as needed for wheezing or shortness of breath. 03/24/20  Yes Medina-Vargas, Monina C, NP  ASCORBIC ACID  PO Take 2 tablets by mouth in the morning. Gummies   Yes [provider]  aspirin  EC 81 MG tablet Take 81 mg by mouth in the morning.   Yes [provider]  atenolol  (TENORMIN ) 50 MG tablet Take 1 tablet (50 mg total) by mouth daily. 03/24/20  Yes Medina-Vargas, Monina C, NP  CELEBREX  200 MG capsule Take 200 mg by mouth 2 (two) times daily.   Yes [provider]  chlorzoxazone  (PARAFON ) 500 MG tablet Take 500 mg by mouth in the morning, at noon, in the evening, and at bedtime.   Yes [provider]  Cholecalciferol  (VITAMIN D -3 PO) Take 1 tablet by mouth in the morning.   Yes [provider]  Cyanocobalamin  (VITAMIN B-12 PO) Take 1 tablet by mouth in the morning.   Yes [provider]  cyclobenzaprine  (FLEXERIL ) 10 MG tablet Take 10 mg by mouth 3 (three) times daily.   Yes [provider]  diazepam  (VALIUM ) 5 MG tablet Take 5 mg by mouth in the morning and at bedtime.   Yes [provider]  diphenhydrAMINE  (BENADRYL ) 25 MG tablet Take 50 mg by mouth in the morning, at noon, in the evening, and at bedtime.   Yes [provider]  doxycycline  (VIBRA -TABS) 100 MG tablet Take 100 mg by mouth 2 (two) times daily.   Yes [provider]  gabapentin  (NEURONTIN ) 600 MG tablet Take 2 tablets (1,200 mg total) by mouth 3 (three) times daily. 03/24/20  Yes Medina-Vargas, Monina C, NP  hydrocerin (EUCERIN) CREA Apply 1 Application topically 2 (two)  times daily. Patient taking differently: Apply 1 Application topically 2 (two) times daily as needed (skin irritation). 01/30/24  Yes Love, Sharlet RAMAN, PA-C  hydrochlorothiazide  (HYDRODIURIL ) 25 MG tablet Take 25 mg by mouth in the morning. 02/02/24  Yes [provider]  lubiprostone  (AMITIZA ) 24 MCG capsule Take 1 capsule (24 mcg total) by mouth 2 (two) times daily with a meal. Patient taking differently: Take 48 mcg by mouth daily with lunch. 03/24/20  Yes Medina-Vargas, Monina C, NP  methocarbamol  (ROBAXIN ) 500 MG tablet Take 500 mg by mouth 4 (four) times daily.   Yes [provider]  Multiple Minerals-Vitamins (CALCIUM -MAGNESIUM -ZINC -D3 PO) Take 1 tablet by mouth in the morning.   Yes [provider]  Multiple Vitamin (MULTIVITAMIN WITH MINERALS) TABS tablet Take 1 tablet by mouth in the morning. Women's Multivitamin   Yes [provider]  naloxone  (NARCAN ) nasal spray 4 mg/0.1 mL Place 1 spray into the nose once as needed (opioid overdose). 02/02/24  Yes Love, Sharlet RAMAN, PA-C  omeprazole (PRILOSEC) 20 MG capsule Take 20 mg by mouth in the morning and at bedtime.    Yes [provider]  oxyCODONE -acetaminophen  (PERCOCET) 10-325 MG tablet Take 1 tablet by mouth 5 (five) times daily. 06/08/24  Yes [provider]  polyethylene glycol powder (GLYCOLAX /MIRALAX ) 17 GM/SCOOP powder Take 17 g by mouth daily. 01/31/24  Yes Love, Sharlet RAMAN, PA-C  VOLTAREN  1 % GEL Apply 1 Application topically 4 (four) times daily as needed (pain.).   Yes [provider]  cefadroxil  (DURICEF) 500 MG capsule Take 2 capsules (1,000 mg total) by mouth 2 (two) times daily. Patient not taking:  Reported on 06/22/2024 05/08/24   Dea Shiner, MD    Physical Exam: Vitals:   07/20/24 2326 07/21/24 0356 07/21/24 0753 07/21/24 1143  BP: 110/62 112/64 106/72 112/72  Pulse: 73 72 73 73  Resp: 16 16 19 20   Temp: 98.7 F (37.1 C) 99 F (37.2 C) 98 F (36.7 C) 98.2 F (36.8  C)  TempSrc: Oral  Oral   SpO2: 94% 95% 100% 95%  Height:       Physical Exam Vitals reviewed.  Constitutional:      General: She is not in acute distress.    Appearance: She is not ill-appearing.  HENT:     Head: Normocephalic.  Eyes:     Extraocular Movements: Extraocular movements intact.  Cardiovascular:     Rate and Rhythm: Normal rate and regular rhythm.     Heart sounds: Normal heart sounds.  Pulmonary:     Breath sounds: Rales present.  Abdominal:     General: Abdomen is protuberant. There is no distension.     Palpations: Abdomen is soft.     Tenderness: There is no abdominal tenderness.  Musculoskeletal:     Right lower leg: Edema present.     Left lower leg: Edema present.  Neurological:     General: No focal deficit present.     Mental Status: She is alert and oriented to person, place, and time.    Primary team communication: Dr.Garst. Thank you very much for involving us  in the care of your patient.  Author: Mario LULLA Blanch, MD 07/21/2024 2:49 PM  For on call review [email or web address].

## 2024-07-21 NOTE — Progress Notes (Addendum)
 Wound Vac (prevana) kept beeping regardless of reinforced dressing with tegarderm dressing. Paged neurosurgeon office . Surgeon at the bedside.   Amado GORMAN Arabia, RN

## 2024-07-21 NOTE — Progress Notes (Signed)
 Assessment 63 y/o F w/ hx long segment thoracolumbar construct who is s/p T9-10 lami/disc  LOS: 21 days    Plan: Await placement Supportive cares Continue wound vac Hospitalist consulted for BLE edema - appreciate assist   Subjective: Neurologically stable  Objective: Vital signs in last 24 hours: Temp:  [98 F (36.7 C)-98.7 F (37.1 C)] 98 F (36.7 C) (10/16 0814) Pulse Rate:  [66-78] 66 (10/16 0814) Resp:  [18-19] 18 (10/16 0814) BP: (117-131)/(62-66) 131/66 (10/16 0814) SpO2:  [93 %-95 %] 95 % (10/16 0814)  Intake/Output from previous day: 10/15 0701 - 10/16 0700 In: -  Out: 200 [Urine:200] Intake/Output this shift: No intake/output data recorded.  Exam: Awake, alert MAE's well Prevena in place  Lab Results: Recent Labs    07/21/24 1532  WBC 11.3*  HGB 11.1*  HCT 37.4  PLT 512*   BMET Recent Labs    07/21/24 1532  NA 140  K 4.1  CL 95*  CO2 32  GLUCOSE 104*  BUN 20  CREATININE 0.79  CALCIUM  9.2    Studies/Results: DG CHEST PORT 1 VIEW Result Date: 07/22/2024 CLINICAL DATA:  Shortness of breath. EXAM: PORTABLE CHEST 1 VIEW COMPARISON:  01/13/2024 FINDINGS: Low volume film. The cardio pericardial silhouette is enlarged. Vascular congestion evident and component of interstitial edema not excluded. Streaky parahilar density bilaterally suggest atelectasis. No substantial pleural effusion. IMPRESSION: 1. Low volume film with vascular congestion and possible interstitial edema. 2. Streaky parahilar density bilaterally suggest atelectasis. Electronically Signed   By: Camellia Candle M.D.   On: 07/22/2024 10:05   ECHOCARDIOGRAM COMPLETE Result Date: 07/21/2024    ECHOCARDIOGRAM REPORT   Patient Name:   [name] Date of Exam: 07/21/2024 Medical Rec #:  [id number]       Height:       62.0 in Accession #:    [id number]      Weight:       219.1 lb Date of Birth:  1961/05/15      BSA:          1.987 m Patient Age:    62 years        BP:           112/72  mmHg Patient Gender: F               HR:           60 bpm. Exam Location:  Inpatient Procedure: 2D Echo, Cardiac Doppler, Color Doppler and Intracardiac            Opacification Agent (Both Spectral and Color Flow Doppler were            utilized during procedure). Indications:    Edema  History:        Patient has prior history of Echocardiogram examinations, most                 recent 02/29/2020. Arrythmias:Tachycardia,                 Signs/Symptoms:Shortness of Breath and Edema; Risk                 Factors:Hypertension and Former Smoker.  Sonographer:    Juliene Rucks Referring Phys: [phone] EKTA V PATEL  Sonographer Comments: Patient is obese. Image acquisition challenging due to patient body habitus and Image acquisition challenging due to respiratory motion. IMPRESSIONS  1. Left ventricular ejection fraction, by estimation, is 70 to 75%. The left ventricle has hyperdynamic function. The  left ventricle has no regional wall motion abnormalities. There is mild left ventricular hypertrophy. Left ventricular diastolic parameters are consistent with Grade I diastolic dysfunction (impaired relaxation).  2. Right ventricular systolic function is normal. The right ventricular size is normal. There is moderately elevated pulmonary artery systolic pressure.  3. The mitral valve is normal in structure. No evidence of mitral valve regurgitation. No evidence of mitral stenosis.  4. The aortic valve is tricuspid. Aortic valve regurgitation is not visualized. No aortic stenosis is present.  5. The inferior vena cava is normal in size with greater than 50% respiratory variability, suggesting right atrial pressure of 3 mmHg. FINDINGS  Left Ventricle: Left ventricular ejection fraction, by estimation, is 70 to 75%. The left ventricle has hyperdynamic function. The left ventricle has no regional wall motion abnormalities. Definity contrast agent was given IV to delineate the left ventricular endocardial borders. The left  ventricular internal cavity size was normal in size. There is mild left ventricular hypertrophy. Left ventricular diastolic parameters are consistent with Grade I diastolic dysfunction (impaired relaxation). Right Ventricle: The right ventricular size is normal. Right ventricular systolic function is normal. There is moderately elevated pulmonary artery systolic pressure. The tricuspid regurgitant velocity is 3.35 m/s, and with an assumed right atrial pressure of 3 mmHg, the estimated right ventricular systolic pressure is 47.9 mmHg. Left Atrium: Left atrial size was normal in size. Right Atrium: Right atrial size was normal in size. Pericardium: There is no evidence of pericardial effusion. Mitral Valve: The mitral valve is normal in structure. No evidence of mitral valve regurgitation. No evidence of mitral valve stenosis. Tricuspid Valve: The tricuspid valve is normal in structure. Tricuspid valve regurgitation is trivial. No evidence of tricuspid stenosis. Aortic Valve: The aortic valve is tricuspid. Aortic valve regurgitation is not visualized. No aortic stenosis is present. Pulmonic Valve: The pulmonic valve was normal in structure. Pulmonic valve regurgitation is not visualized. No evidence of pulmonic stenosis. Aorta: The aortic root is normal in size and structure. Venous: The inferior vena cava is normal in size with greater than 50% respiratory variability, suggesting right atrial pressure of 3 mmHg. IAS/Shunts: No atrial level shunt detected by color flow Doppler.  RIGHT VENTRICLE          IVC RV Basal diam:  3.60 cm  IVC diam: 1.70 cm RV Mid diam:    2.70 cm LEFT ATRIUM             Index        RIGHT ATRIUM           Index LA Vol (A2C):   52.3 ml 26.32 ml/m  RA Area:     12.70 cm LA Vol (A4C):   34.1 ml 17.16 ml/m  RA Volume:   30.40 ml  15.30 ml/m LA Biplane Vol: 45.2 ml 22.74 ml/m  TRICUSPID VALVE TR Peak grad:   44.9 mmHg TR Vmax:        335.00 cm/s Redell Shallow MD Electronically signed by  Redell Shallow MD Signature Date/Time: 07/21/2024/5:50:05 PM    Final       [name] 07/22/2024, 12:27 PM

## 2024-07-22 ENCOUNTER — Inpatient Hospital Stay (HOSPITAL_COMMUNITY)

## 2024-07-22 DIAGNOSIS — I5032 Chronic diastolic (congestive) heart failure: Secondary | ICD-10-CM | POA: Diagnosis not present

## 2024-07-22 DIAGNOSIS — M4804 Spinal stenosis, thoracic region: Secondary | ICD-10-CM | POA: Diagnosis not present

## 2024-07-22 DIAGNOSIS — I10 Essential (primary) hypertension: Secondary | ICD-10-CM

## 2024-07-22 DIAGNOSIS — R609 Edema, unspecified: Secondary | ICD-10-CM

## 2024-07-22 MED ORDER — FUROSEMIDE 40 MG PO TABS
40.0000 mg | ORAL_TABLET | Freq: Every day | ORAL | Status: DC
  Administered 2024-07-22 – 2024-09-05 (×46): 40 mg via ORAL
  Filled 2024-07-22 (×46): qty 1

## 2024-07-22 NOTE — Plan of Care (Signed)

## 2024-07-22 NOTE — Progress Notes (Signed)
 Assessment 63 y/o F w/ hx long segment thoracolumbar construct who is s/p T9-10 lami/disc  LOS: 22 days    Plan: Await placement Supportive cares Continue wound vac Hospitalist consulted for BLE edema - appreciate assist   Subjective: Nae o/n  Objective: Vital signs in last 24 hours: Temp:  [97.8 F (36.6 C)-98.9 F (37.2 C)] 98.9 F (37.2 C) (10/17 1100) Pulse Rate:  [69-83] 71 (10/17 1100) Resp:  [18-20] 18 (10/17 1100) BP: (112-131)/(65-79) 114/71 (10/17 1100) SpO2:  [92 %-100 %] 97 % (10/17 1100)  Intake/Output from previous day: 10/16 0701 - 10/17 0700 In: 240 [P.O.:240] Out: 1200 [Urine:1200] Intake/Output this shift: No intake/output data recorded.  Exam: Awake, alert MAE's well Prevena in place  Lab Results: Recent Labs    07/21/24 1532  WBC 11.3*  HGB 11.1*  HCT 37.4  PLT 512*   BMET Recent Labs    07/21/24 1532  NA 140  K 4.1  CL 95*  CO2 32  GLUCOSE 104*  BUN 20  CREATININE 0.79  CALCIUM  9.2    Studies/Results: VAS US  LOWER EXTREMITY VENOUS (DVT) Result Date: 07/22/2024  Lower Venous DVT Study Patient Name:  MACEL YEARSLEY  Date of Exam:   07/22/2024 Medical Rec #: [id number]        Accession #:    [id number] Date of Birth: [birth year]       Patient Gender: F Patient Age:   60 years Exam Location:  [facility] Procedure:      VAS US  LOWER EXTREMITY VENOUS (DVT) Referring Phys: EKTA PATEL --------------------------------------------------------------------------------  Indications: Edema.  Risk Factors: Surgery Thoracic 9 & 10 Laminectomy and discectomy 06/29/2024. Limitations: Body habitus and poor ultrasound/tissue interface. Comparison Study: Previous RLEV on 11/19/2023 and BLEV on 03/26/2019 were negative                   for DVT. Performing Technologist: Ezzie Potters RVT, RDMS  Examination Guidelines: A complete evaluation includes B-mode imaging, spectral Doppler, color Doppler, and power Doppler as needed of all accessible portions of  each vessel. Bilateral testing is considered an integral part of a complete examination. Limited examinations for reoccurring indications may be performed as noted. The reflux portion of the exam is performed with the patient in reverse Trendelenburg.  +---------+---------------+---------+-----------+----------+--------------+ RIGHT    CompressibilityPhasicitySpontaneityPropertiesThrombus Aging +---------+---------------+---------+-----------+----------+--------------+ CFV      Full           Yes      Yes                                 +---------+---------------+---------+-----------+----------+--------------+ SFJ      Full                                                        +---------+---------------+---------+-----------+----------+--------------+ FV Prox  Full           Yes      Yes                                 +---------+---------------+---------+-----------+----------+--------------+ FV Mid   Full           Yes      Yes                                 +---------+---------------+---------+-----------+----------+--------------+  FV DistalFull           Yes      Yes                                 +---------+---------------+---------+-----------+----------+--------------+ PFV      Full                                                        +---------+---------------+---------+-----------+----------+--------------+ POP      Full           Yes      Yes                                 +---------+---------------+---------+-----------+----------+--------------+ PTV      Full                                                        +---------+---------------+---------+-----------+----------+--------------+ PERO     Full                                                        +---------+---------------+---------+-----------+----------+--------------+   +---------+---------------+---------+-----------+----------+--------------+ LEFT      CompressibilityPhasicitySpontaneityPropertiesThrombus Aging +---------+---------------+---------+-----------+----------+--------------+ CFV      Full           Yes      Yes                                 +---------+---------------+---------+-----------+----------+--------------+ SFJ      Full                                                        +---------+---------------+---------+-----------+----------+--------------+ FV Prox  Full           Yes      Yes                                 +---------+---------------+---------+-----------+----------+--------------+ FV Mid   Full           Yes      Yes                                 +---------+---------------+---------+-----------+----------+--------------+ FV DistalFull           Yes      Yes                                 +---------+---------------+---------+-----------+----------+--------------+ PFV      Full                                                        +---------+---------------+---------+-----------+----------+--------------+  POP      Full           Yes      Yes                                 +---------+---------------+---------+-----------+----------+--------------+ PTV      Full                                                        +---------+---------------+---------+-----------+----------+--------------+ PERO     Full                                                        +---------+---------------+---------+-----------+----------+--------------+     Summary: BILATERAL: - No evidence of deep vein thrombosis seen in the lower extremities, bilaterally. -No evidence of popliteal cyst, bilaterally.   *See table(s) above for measurements and observations. Electronically signed by Debby Robertson on 07/22/2024 at 5:14:09 PM.    Final    DG CHEST PORT 1 VIEW Result Date: 07/22/2024 CLINICAL DATA:  Shortness of breath. EXAM: PORTABLE CHEST 1 VIEW COMPARISON:  01/13/2024 FINDINGS: Low  volume film. The cardio pericardial silhouette is enlarged. Vascular congestion evident and component of interstitial edema not excluded. Streaky parahilar density bilaterally suggest atelectasis. No substantial pleural effusion. IMPRESSION: 1. Low volume film with vascular congestion and possible interstitial edema. 2. Streaky parahilar density bilaterally suggest atelectasis. Electronically Signed   By: Camellia Candle M.D.   On: 07/22/2024 10:05   ECHOCARDIOGRAM COMPLETE Result Date: 07/21/2024    ECHOCARDIOGRAM REPORT   Patient Name:   BEVELY HACKBART Date of Exam: 07/21/2024 Medical Rec #:  [id number]       Height:       62.0 in Accession #:    [id number]      Weight:       219.1 lb Date of Birth:  [birth year]      BSA:          1.987 m Patient Age:    62 years        BP:           112/72 mmHg Patient Gender: F               HR:           60 bpm. Exam Location:  Inpatient Procedure: 2D Echo, Cardiac Doppler, Color Doppler and Intracardiac            Opacification Agent (Both Spectral and Color Flow Doppler were            utilized during procedure). Indications:    Edema  History:        Patient has prior history of Echocardiogram examinations, most                 recent 02/29/2020. Arrythmias:Tachycardia,                 Signs/Symptoms:Shortness of Breath and Edema; Risk                 Factors:Hypertension and Former Smoker.  Sonographer:    Juliene Rucks  Referring Phys: [id number] EKTA V PATEL  Sonographer Comments: Patient is obese. Image acquisition challenging due to patient body habitus and Image acquisition challenging due to respiratory motion. IMPRESSIONS  1. Left ventricular ejection fraction, by estimation, is 70 to 75%. The left ventricle has hyperdynamic function. The left ventricle has no regional wall motion abnormalities. There is mild left ventricular hypertrophy. Left ventricular diastolic parameters are consistent with Grade I diastolic dysfunction (impaired relaxation).  2. Right ventricular  systolic function is normal. The right ventricular size is normal. There is moderately elevated pulmonary artery systolic pressure.  3. The mitral valve is normal in structure. No evidence of mitral valve regurgitation. No evidence of mitral stenosis.  4. The aortic valve is tricuspid. Aortic valve regurgitation is not visualized. No aortic stenosis is present.  5. The inferior vena cava is normal in size with greater than 50% respiratory variability, suggesting right atrial pressure of 3 mmHg. FINDINGS  Left Ventricle: Left ventricular ejection fraction, by estimation, is 70 to 75%. The left ventricle has hyperdynamic function. The left ventricle has no regional wall motion abnormalities. Definity contrast agent was given IV to delineate the left ventricular endocardial borders. The left ventricular internal cavity size was normal in size. There is mild left ventricular hypertrophy. Left ventricular diastolic parameters are consistent with Grade I diastolic dysfunction (impaired relaxation). Right Ventricle: The right ventricular size is normal. Right ventricular systolic function is normal. There is moderately elevated pulmonary artery systolic pressure. The tricuspid regurgitant velocity is 3.35 m/s, and with an assumed right atrial pressure of 3 mmHg, the estimated right ventricular systolic pressure is 47.9 mmHg. Left Atrium: Left atrial size was normal in size. Right Atrium: Right atrial size was normal in size. Pericardium: There is no evidence of pericardial effusion. Mitral Valve: The mitral valve is normal in structure. No evidence of mitral valve regurgitation. No evidence of mitral valve stenosis. Tricuspid Valve: The tricuspid valve is normal in structure. Tricuspid valve regurgitation is trivial. No evidence of tricuspid stenosis. Aortic Valve: The aortic valve is tricuspid. Aortic valve regurgitation is not visualized. No aortic stenosis is present. Pulmonic Valve: The pulmonic valve was normal in  structure. Pulmonic valve regurgitation is not visualized. No evidence of pulmonic stenosis. Aorta: The aortic root is normal in size and structure. Venous: The inferior vena cava is normal in size with greater than 50% respiratory variability, suggesting right atrial pressure of 3 mmHg. IAS/Shunts: No atrial level shunt detected by color flow Doppler.  RIGHT VENTRICLE          IVC RV Basal diam:  3.60 cm  IVC diam: 1.70 cm RV Mid diam:    2.70 cm LEFT ATRIUM             Index        RIGHT ATRIUM           Index LA Vol (A2C):   52.3 ml 26.32 ml/m  RA Area:     12.70 cm LA Vol (A4C):   34.1 ml 17.16 ml/m  RA Volume:   30.40 ml  15.30 ml/m LA Biplane Vol: 45.2 ml 22.74 ml/m  TRICUSPID VALVE TR Peak grad:   44.9 mmHg TR Vmax:        335.00 cm/s Redell Shallow MD Electronically signed by Redell Shallow MD Signature Date/Time: 07/21/2024/5:50:05 PM    Final       [name] 07/23/2024, 12:37 PM

## 2024-07-22 NOTE — Progress Notes (Signed)
 BLE venous duplex has been completed.   Results can be found under chart review under CV PROC. 07/22/2024 3:28 PM Murice Barbar RVT, RDMS

## 2024-07-22 NOTE — Progress Notes (Signed)
 [facility]                                                                               [name], is a 63 y.o. female, DOB - 17-Nov-1960, FMW:[id number] Admit date - 06/29/2024    Outpatient Primary MD for the patient is Jill Cole, MD  LOS - 21  days    Brief summary    [name] is a 63 y.o. female with past medical history of essential tremor, GERD, irritable bowel syndrome admitted for laminectomy on neurosurgery service requested to consult for lower extremity edema and medical management.   Assessment & Plan    Assessment and Plan:  Bilateral lower extremity edema Possibly from diastolic CHF.  BNP elevated at 239, Echocardiogram showed LVEF of 70 to 75%, no regional wall motion abn. LV diastolic parameters consistent with grade 1 diastolic dysfunction.  Cxr showing Low volume film with vascular congestion and possible interstitial edema. Check lower extremity duplex to evaluate for DVT.  Stop the hydrochlorothiazide  and start the patient on lasix  40 mg daily.    Elevated d dimer:  Check venous duplex of the lower extremity.  NM perfusion scan.    Hypertension:  Well controlled.    Back pain s/p laminectomy  Pain control.    Mild normocytic anemia;  Hemoglobin stable around 11.   GERD  Stable at protonix .    Estimated body mass index is 40.08 kg/m as calculated from the following:   Height as of this encounter: 5' 2 (1.575 m).   Weight as of 02/01/24: 99.4 kg.  Code Status: full code.  DVT Prophylaxis:  SCD's Start: 06/29/24 1541     Antimicrobials:   Anti-infectives (From admission, onward)    Start     Dose/Rate Route Frequency Ordered Stop   06/30/24 0600  ceFAZolin  (ANCEF ) IVPB 2g/100 mL premix  Status:  Discontinued        2 g 200 mL/hr over 30 Minutes Intravenous On call to O.R. 06/29/24 1539 06/29/24 1832   06/29/24 2200  doxycycline  (VIBRA -TABS) tablet 100 mg        100 mg Oral 2 times daily 06/29/24 1412      06/29/24 1900  ceFAZolin  (ANCEF ) IVPB 2g/100 mL premix        2 g 200 mL/hr over 30 Minutes Intravenous Every 8 hours 06/29/24 1540 06/30/24 0341   06/29/24 1215  vancomycin  (VANCOCIN ) powder  Status:  Discontinued          As needed 06/29/24 1215 06/29/24 1336   06/29/24 1214  gentamicin  (GARAMYCIN ) injection  Status:  Discontinued          As needed 06/29/24 1214 06/29/24 1336   06/29/24 1035  ceFAZolin  (ANCEF ) 2-4 GM/100ML-% IVPB       Note to Pharmacy: Virgil Ee: cabinet override      06/29/24 1035 06/29/24 2244        Medications  Scheduled Meds:  ascorbic acid   500 mg Oral Daily   aspirin  EC  81 mg Oral Daily   atenolol   50 mg Oral Daily   celecoxib   200 mg Oral BID   chlorzoxazone   500 mg Oral  TID   cholecalciferol    Oral Daily   cyanocobalamin   500 mcg Oral Daily   cyclobenzaprine   10 mg Oral TID   doxycycline   100 mg Oral BID   gabapentin   1,200 mg Oral TID   hydrochlorothiazide   25 mg Oral q AM   influenza vac split trivalent PF  0.5 mL Intramuscular Tomorrow-1000   lubiprostone   48 mcg Oral Q lunch   multivitamin with minerals  1 tablet Oral q AM   oxyCODONE   15 mg Oral Q12H   pantoprazole   40 mg Oral Daily   senna  1 tablet Oral BID   sodium chloride  flush  3 mL Intravenous Q12H   Continuous Infusions:  0.9 % NaCl with KCl 20 mEq / L Stopped (07/01/24 2355)   PRN Meds:.acetaminophen  **OR** acetaminophen , albuterol , bisacodyl , diazepam , diphenhydrAMINE , magnesium  citrate, menthol  **OR** phenol, morphine  injection, naloxone , ondansetron  **OR** ondansetron  (ZOFRAN ) IV, mouth rinse, oxyCODONE , oxyCODONE , senna-docusate, sodium chloride  flush    Subjective:   [name] was seen and examined today. No new complaints.   Objective:   Vitals:   07/21/24 1951 07/22/24 0032 07/22/24 0421 07/22/24 0814  BP: 128/62 121/62 117/64 131/66  Pulse: 78 76 67 66  Resp:   19 18  Temp: 98.7 F (37.1 C) 98.5 F (36.9 C) 98.4 F (36.9 C) 98 F (36.7 C)   TempSrc: Oral Oral Oral   SpO2: 93% 93% 94% 95%  Height:        Intake/Output Summary (Last 24 hours) at 07/22/2024 1048 Last data filed at 07/21/2024 1606 Gross per 24 hour  Intake --  Output 200 ml  Net -200 ml   There were no vitals filed for this visit.   Exam General exam: Appears calm and comfortable  Respiratory system: Clear to auscultation. Respiratory effort normal. Cardiovascular system: S1 & S2 heard, RRR.  Gastrointestinal system: Abdomen is nondistended, soft and nontender.  Central nervous system: Alert and oriented. Extremities: leg swelling bilateral.  Skin: No rashes,  Psychiatry: Mood & affect appropriate.     Data Reviewed:  I have personally reviewed following labs and imaging studies   CBC Lab Results  Component Value Date   WBC 11.3 (H) 07/21/2024   RBC 4.56 07/21/2024   HGB 11.1 (L) 07/21/2024   HCT 37.4 07/21/2024   MCV 82.0 07/21/2024   MCH 24.3 (L) 07/21/2024   PLT 512 (H) 07/21/2024   MCHC 29.7 (L) 07/21/2024   RDW 15.7 (H) 07/21/2024   LYMPHSABS 2.8 07/21/2024   MONOABS 0.6 07/21/2024   EOSABS 0.6 (H) 07/21/2024   BASOSABS 0.1 07/21/2024     Last metabolic panel Lab Results  Component Value Date   NA 140 07/21/2024   K 4.1 07/21/2024   CL 95 (L) 07/21/2024   CO2 32 07/21/2024   BUN 20 07/21/2024   CREATININE 0.79 07/21/2024   GLUCOSE 104 (H) 07/21/2024   GFRNONAA >60 07/21/2024   GFRAA 90 03/22/2020   CALCIUM  9.2 07/21/2024   PHOS 3.7 11/01/2023   PROT 6.3 (L) 07/21/2024   ALBUMIN  3.2 (L) 07/21/2024   BILITOT 0.4 07/21/2024   ALKPHOS 127 (H) 07/21/2024   AST 18 07/21/2024   ALT 13 07/21/2024   ANIONGAP 13 07/21/2024    CBG (last 3)  No results for input(s): GLUCAP in the last 72 hours.    Coagulation Profile: No results for input(s): INR, PROTIME in the last 168 hours.   Radiology Studies: DG CHEST PORT 1 VIEW Result Date: 07/22/2024 CLINICAL DATA:  Shortness of breath. EXAM: PORTABLE CHEST 1 VIEW  COMPARISON:  01/13/2024 FINDINGS: Low volume film. The cardio pericardial silhouette is enlarged. Vascular congestion evident and component of interstitial edema not excluded. Streaky parahilar density bilaterally suggest atelectasis. No substantial pleural effusion. IMPRESSION: 1. Low volume film with vascular congestion and possible interstitial edema. 2. Streaky parahilar density bilaterally suggest atelectasis. Electronically Signed   By: Camellia Candle M.D.   On: 07/22/2024 10:05   ECHOCARDIOGRAM COMPLETE Result Date: 07/21/2024    ECHOCARDIOGRAM REPORT   Patient Name:   KENNADIE BRENNER Date of Exam: 07/21/2024 Medical Rec #:  [id number]       Height:       62.0 in Accession #:    [id number]      Weight:       219.1 lb Date of Birth:  1960/11/21      BSA:          1.987 m Patient Age:    62 years        BP:           112/72 mmHg Patient Gender: F               HR:           60 bpm. Exam Location:  Inpatient Procedure: 2D Echo, Cardiac Doppler, Color Doppler and Intracardiac            Opacification Agent (Both Spectral and Color Flow Doppler were            utilized during procedure). Indications:    Edema  History:        Patient has prior history of Echocardiogram examinations, most                 recent 02/29/2020. Arrythmias:Tachycardia,                 Signs/Symptoms:Shortness of Breath and Edema; Risk                 Factors:Hypertension and Former Smoker.  Sonographer:    Juliene Rucks Referring Phys: [phone] EKTA V PATEL  Sonographer Comments: Patient is obese. Image acquisition challenging due to patient body habitus and Image acquisition challenging due to respiratory motion. IMPRESSIONS  1. Left ventricular ejection fraction, by estimation, is 70 to 75%. The left ventricle has hyperdynamic function. The left ventricle has no regional wall motion abnormalities. There is mild left ventricular hypertrophy. Left ventricular diastolic parameters are consistent with Grade I diastolic dysfunction (impaired  relaxation).  2. Right ventricular systolic function is normal. The right ventricular size is normal. There is moderately elevated pulmonary artery systolic pressure.  3. The mitral valve is normal in structure. No evidence of mitral valve regurgitation. No evidence of mitral stenosis.  4. The aortic valve is tricuspid. Aortic valve regurgitation is not visualized. No aortic stenosis is present.  5. The inferior vena cava is normal in size with greater than 50% respiratory variability, suggesting right atrial pressure of 3 mmHg. FINDINGS  Left Ventricle: Left ventricular ejection fraction, by estimation, is 70 to 75%. The left ventricle has hyperdynamic function. The left ventricle has no regional wall motion abnormalities. Definity contrast agent was given IV to delineate the left ventricular endocardial borders. The left ventricular internal cavity size was normal in size. There is mild left ventricular hypertrophy. Left ventricular diastolic parameters are consistent with Grade I diastolic dysfunction (impaired relaxation). Right Ventricle: The right ventricular size is normal. Right ventricular systolic function is normal.  There is moderately elevated pulmonary artery systolic pressure. The tricuspid regurgitant velocity is 3.35 m/s, and with an assumed right atrial pressure of 3 mmHg, the estimated right ventricular systolic pressure is 47.9 mmHg. Left Atrium: Left atrial size was normal in size. Right Atrium: Right atrial size was normal in size. Pericardium: There is no evidence of pericardial effusion. Mitral Valve: The mitral valve is normal in structure. No evidence of mitral valve regurgitation. No evidence of mitral valve stenosis. Tricuspid Valve: The tricuspid valve is normal in structure. Tricuspid valve regurgitation is trivial. No evidence of tricuspid stenosis. Aortic Valve: The aortic valve is tricuspid. Aortic valve regurgitation is not visualized. No aortic stenosis is present. Pulmonic Valve:  The pulmonic valve was normal in structure. Pulmonic valve regurgitation is not visualized. No evidence of pulmonic stenosis. Aorta: The aortic root is normal in size and structure. Venous: The inferior vena cava is normal in size with greater than 50% respiratory variability, suggesting right atrial pressure of 3 mmHg. IAS/Shunts: No atrial level shunt detected by color flow Doppler.  RIGHT VENTRICLE          IVC RV Basal diam:  3.60 cm  IVC diam: 1.70 cm RV Mid diam:    2.70 cm LEFT ATRIUM             Index        RIGHT ATRIUM           Index LA Vol (A2C):   52.3 ml 26.32 ml/m  RA Area:     12.70 cm LA Vol (A4C):   34.1 ml 17.16 ml/m  RA Volume:   30.40 ml  15.30 ml/m LA Biplane Vol: 45.2 ml 22.74 ml/m  TRICUSPID VALVE TR Peak grad:   44.9 mmHg TR Vmax:        335.00 cm/s Redell Shallow MD Electronically signed by Redell Shallow MD Signature Date/Time: 07/21/2024/5:50:05 PM    Final        Elgie Butter M.D. [facility] 07/22/2024, 10:48 AM  Available via Epic secure chat 7am-7pm After 7 pm, please refer to night coverage provider listed on amion.

## 2024-07-22 NOTE — Progress Notes (Signed)
 Physical Therapy Treatment Patient Details Name: [name] MRN: [id number] DOB: Nov 25, 1960 Today's Date: 07/22/2024   History of Present Illness Pt is a 63 y/o F admitted for thoracic stenosis at T9/10 and LE weakness, now s/p thoracic decompression with neurosurgery on 06/29/24. Of note, pt had T9-10 laminectomy on 01/08/24 and was admitted to [facility] for rehab. PMHx: GERD, anxiety, HTN, fibromyalgia, hyperthyroidism, IBS, ACDF 3/4, posterior lateral arthrodesis 6/16, CSF leak 6/24.    PT Comments  Pt received in supine and agreeable to session. Pt requires increased time to complete mobility tasks due to weakness, pain, and fatigue. Pt able to stand to RW and take a few lateral steps towards [facility] with increased difficulty advancing RLE. Pt requires increased assist with bed mobility due to weakness. Pt continues to benefit from PT services to progress toward functional mobility goals.    If plan is discharge home, recommend the following: A lot of help with walking and/or transfers;A lot of help with bathing/dressing/bathroom;Assist for transportation;Help with stairs or ramp for entrance   Can travel by private vehicle        Equipment Recommendations  Other (comment) (TBD)    Recommendations for Other Services       Precautions / Restrictions Precautions Precautions: Back;Fall Precaution Booklet Issued: No Recall of Precautions/Restrictions: Intact Precaution/Restrictions Comments: R AFO Restrictions Weight Bearing Restrictions Per Provider Order: No     Mobility  Bed Mobility Overal bed mobility: Needs Assistance Bed Mobility: Rolling, Sidelying to Sit, Sit to Supine Rolling: Min assist Sidelying to sit: Mod assist   Sit to supine: Mod assist, Used rails   General bed mobility comments: cues for logroll technique and assist for BLE advancement and trunk elevation    Transfers Overall transfer level: Needs assistance Equipment used: Rolling walker (2  wheels) Transfers: Sit to/from Stand Sit to Stand: Mod assist, From elevated surface           General transfer comment: from elevated EOB x2 with mod A for power up. Pt able to take 2 steps EOB twice with a seated rest break due to pain and fatigue. Increased effort to advance BLE and assist for RW management    Ambulation/Gait               General Gait Details: unable to progress   Stairs             Wheelchair Mobility     Tilt Bed    Modified Rankin (Stroke Patients Only)       Balance Overall balance assessment: Needs assistance Sitting-balance support: No upper extremity supported, Feet supported, Bilateral upper extremity supported Sitting balance-[name]: Fair Sitting balance - Comments: sitting EOB. Pt initially reliant on BUE support due to pain   Standing balance support: Bilateral upper extremity supported, During functional activity, Reliant on assistive device for balance Standing balance-[name]: Poor Standing balance comment: reliant on BUE support                            Communication Communication Communication: No apparent difficulties  Cognition Arousal: Alert Behavior During Therapy: WFL for tasks assessed/performed   PT - Cognitive impairments: No apparent impairments                       PT - Cognition Comments: Slow response time Following commands: Intact      Cueing Cueing Techniques: Verbal cues, Tactile cues, Visual cues  Exercises      General Comments        Pertinent Vitals/Pain Pain Assessment Pain Assessment: Faces Faces Pain Scale: Hurts even more Pain Location: back, BLE Pain Descriptors / Indicators: Discomfort, Aching Pain Intervention(s): Limited activity within patient's tolerance, Monitored during session, Repositioned     PT Goals (current goals can now be found in the care plan section) Acute Rehab PT Goals Patient Stated Goal: to get more therapy PT Goal  Formulation: With patient Time For Goal Achievement: 07/14/24 Progress towards PT goals: Progressing toward goals    Frequency    Min 2X/week       AM-PAC PT 6 Clicks Mobility   Outcome Measure  Help needed turning from your back to your side while in a flat bed without using bedrails?: A Little Help needed moving from lying on your back to sitting on the side of a flat bed without using bedrails?: A Lot Help needed moving to and from a bed to a chair (including a wheelchair)?: Total Help needed standing up from a chair using your arms (e.g., wheelchair or bedside chair)?: A Lot Help needed to walk in hospital room?: Total Help needed climbing 3-5 steps with a railing? : Total 6 Click Score: 10    End of Session Equipment Utilized During Treatment: Gait belt Activity Tolerance: Patient tolerated treatment well Patient left: in bed;with call bell/phone within reach;with bed alarm set Nurse Communication: Mobility status PT Visit Diagnosis: Unsteadiness on feet (R26.81);Other abnormalities of gait and mobility (R26.89);Muscle weakness (generalized) (M62.81)     Time: 8845-8760 PT Time Calculation (min) (ACUTE ONLY): 45 min  Charges:    $Therapeutic Activity: 38-52 mins PT General Charges $$ ACUTE PT VISIT: 1 Visit                     Darryle George, PTA Acute Rehabilitation Services Secure Chat Preferred  [phone]    Darryle George 07/22/2024, 1:32 PM

## 2024-07-23 DIAGNOSIS — I5032 Chronic diastolic (congestive) heart failure: Secondary | ICD-10-CM | POA: Diagnosis not present

## 2024-07-23 DIAGNOSIS — I10 Essential (primary) hypertension: Secondary | ICD-10-CM | POA: Diagnosis not present

## 2024-07-23 DIAGNOSIS — M4804 Spinal stenosis, thoracic region: Secondary | ICD-10-CM | POA: Diagnosis not present

## 2024-07-23 NOTE — Progress Notes (Signed)
 Occupational Therapy Treatment Patient Details Name: [name] MRN: [id number] DOB: November 10, 1960 Today's Date: 07/23/2024   History of present illness Pt is a 63 y/o F admitted for thoracic stenosis at T9/10 and LE weakness, now s/p thoracic decompression with neurosurgery on 06/29/24. Of note, pt had T9-10 laminectomy on 01/08/24 and was admitted to [facility] for rehab. PMHx: GERD, anxiety, HTN, fibromyalgia, hyperthyroidism, IBS, ACDF 3/4, posterior lateral arthrodesis 6/16, CSF leak 6/24.   OT comments  Patient received in supine and agreeable to OT treatment. Patient requiring increased time for bed mobility and transfers due to increased time to initiate tasks, possible due to anticipating pain. Patient appears motivated to increase mobility and independence.  Patient will benefit from continued inpatient follow up therapy, <3 hours/day.  Acute OT to continue to follow to address established goals to facilitate DC to next venue of care.        If plan is discharge home, recommend the following:  Two people to help with walking and/or transfers;Two people to help with bathing/dressing/bathroom;Assist for transportation;Help with stairs or ramp for entrance;Assistance with cooking/housework   Equipment Recommendations  Other (comment) (defer)    Recommendations for Other Services      Precautions / Restrictions Precautions Precautions: Back;Fall Precaution Booklet Issued: No Recall of Precautions/Restrictions: Intact Precaution/Restrictions Comments: R AFO Restrictions Weight Bearing Restrictions Per Provider Order: No       Mobility Bed Mobility Overal bed mobility: Needs Assistance Bed Mobility: Rolling, Sidelying to Sit Rolling: Min assist Sidelying to sit: Mod assist       General bed mobility comments: increased time and cues for log rolling technique and assistance with BLEs and raising trunk    Transfers Overall transfer level: Needs assistance Equipment used:  Ambulation equipment used Transfers: Sit to/from Stand, Bed to chair/wheelchair/BSC Sit to Stand: Mod assist, From elevated surface           General transfer comment: mod assist to stand from EOB and min assist from [employer] via Lift Equipment: Stedy   Balance Overall balance assessment: Needs assistance Sitting-balance support: No upper extremity supported, Feet supported, Bilateral upper extremity supported Sitting balance-[name]: Fair Sitting balance - Comments: EOB   Standing balance support: Bilateral upper extremity supported, During functional activity, Reliant on assistive device for balance Standing balance-[name]: Poor Standing balance comment: reliant on Stedy for support                           ADL either performed or assessed with clinical judgement   ADL Overall ADL's : Needs assistance/impaired                 Upper Body Dressing : Contact guard assist;Sitting Upper Body Dressing Details (indicate cue type and reason): to donn gown to for back Lower Body Dressing: Total assistance;Sitting/lateral leans Lower Body Dressing Details (indicate cue type and reason): to donn sneakers                    Extremity/Trunk Assessment              Vision       Perception     Praxis     Communication Communication Communication: No apparent difficulties   Cognition Arousal: Alert Behavior During Therapy: WFL for tasks assessed/performed Cognition: No apparent impairments             OT - Cognition Comments: very talkative  Following commands: Intact        Cueing   Cueing Techniques: Verbal cues, Tactile cues, Visual cues  Exercises      Shoulder Instructions       General Comments VSS on RA, patient required increased time for most tasks due to talkative and increased time to initiate tasks    Pertinent Vitals/ Pain       Pain Assessment Pain Assessment: Faces Faces Pain  Scale: Hurts even more Pain Location: back, BLE Pain Descriptors / Indicators: Discomfort, Aching Pain Intervention(s): Limited activity within patient's tolerance, Monitored during session, Repositioned, Premedicated before session  Home Living                                          Prior Functioning/Environment              Frequency  Min 2X/week        Progress Toward Goals  OT Goals(current goals can now be found in the care plan section)  Progress towards OT goals: Progressing toward goals  Acute Rehab OT Goals Patient Stated Goal: to walk again OT Goal Formulation: With patient Time For Goal Achievement: 07/27/24 Potential to Achieve Goals: Fair ADL Goals Pt Will Perform Grooming: with supervision;sitting Pt Will Perform Upper Body Dressing: with supervision;sitting Pt Will Perform Lower Body Dressing: with min assist;with adaptive equipment;sitting/lateral leans Pt Will Transfer to Toilet: with mod assist;with +2 assist;squat pivot transfer;stand pivot transfer;with transfer board;bedside commode Additional ADL Goal #1: Pt will recall spine precautions 100% of the time during OT sessions for improved safety. Additional ADL Goal #2: Pt will maintain sitting balance EOB with no more than supervision to demonstrate improved balance for ADL engagement.  Plan      Co-evaluation                 AM-PAC OT 6 Clicks Daily Activity     Outcome Measure   Help from another person eating meals?: None Help from another person taking care of personal grooming?: None Help from another person toileting, which includes using toliet, bedpan, or urinal?: A Lot Help from another person bathing (including washing, rinsing, drying)?: A Lot Help from another person to put on and taking off regular upper body clothing?: A Little Help from another person to put on and taking off regular lower body clothing?: A Lot 6 Click Score: 17    End of Session  Equipment Utilized During Treatment: Gait belt;Other (comment) Laurent)  OT Visit Diagnosis: Unsteadiness on feet (R26.81);Muscle weakness (generalized) (M62.81);Pain Pain - part of body:  (ba k)   Activity Tolerance Patient tolerated treatment well   Patient Left in chair;with call bell/phone within reach;with chair alarm set   Nurse Communication Mobility status;Patient requests pain meds        Time: 9045-8961 OT Time Calculation (min): 44 min  Charges: OT General Charges $OT Visit: 1 Visit OT Treatments $Self Care/Home Management : 8-22 mins $Therapeutic Activity: 23-37 mins  Dick Laine, OTA Acute Rehabilitation Services  Office [phone]   Jeb LITTIE Laine 07/23/2024, 1:51 PM

## 2024-07-23 NOTE — Progress Notes (Signed)
 [facility]                                                                               [name], is a 63 y.o. female, DOB - [birth year], FMW:[id number] Admit date - 06/29/2024    Outpatient Primary MD for the patient is Jill Cole, MD  LOS - 22  days    Brief summary    [name] is a 63 y.o. female with past medical history of essential tremor, GERD, irritable bowel syndrome admitted for laminectomy on neurosurgery service requested to consult for lower extremity edema and medical management.   Assessment & Plan    Assessment and Plan:  Bilateral lower extremity edema Possibly from diastolic CHF.  BNP elevated at 239, Echocardiogram showed LVEF of 70 to 75%, no regional wall motion abn. LV diastolic parameters consistent with grade 1 diastolic dysfunction.  Cxr showing Low volume film with vascular congestion and possible interstitial edema. Check lower extremity duplex to evaluate for DVT.  Stop the hydrochlorothiazide  and start the patient on lasix  40 mg daily.  Patient reports lower extremity edema is improving but not resolved yet.    Elevated d dimer:  Check venous duplex of the lower extremity.  NM perfusion scan still pending after 72 hours. Will discuss with patient to see if we can do CT chest with contrast to evaluate for PE.    Hypertension:  Well controlled.    Back pain s/p laminectomy  Pain control.    Mild normocytic anemia;  Hemoglobin stable around 11.   GERD  Stable at protonix .    Estimated body mass index is 40.08 kg/m as calculated from the following:   Height as of this encounter: 5' 2 (1.575 m).   Weight as of 02/01/24: 99.4 kg.  Code Status: full code.  DVT Prophylaxis:  SCD's Start: 06/29/24 1541     Antimicrobials:   Anti-infectives (From admission, onward)    Start     Dose/Rate Route Frequency Ordered Stop   06/30/24 0600  ceFAZolin  (ANCEF ) IVPB 2g/100 mL premix  Status:  Discontinued         2 g 200 mL/hr over 30 Minutes Intravenous On call to O.R. 06/29/24 1539 06/29/24 1832   06/29/24 2200  doxycycline  (VIBRA -TABS) tablet 100 mg        100 mg Oral 2 times daily 06/29/24 1412     06/29/24 1900  ceFAZolin  (ANCEF ) IVPB 2g/100 mL premix        2 g 200 mL/hr over 30 Minutes Intravenous Every 8 hours 06/29/24 1540 06/30/24 0341   06/29/24 1215  vancomycin  (VANCOCIN ) powder  Status:  Discontinued          As needed 06/29/24 1215 06/29/24 1336   06/29/24 1214  gentamicin  (GARAMYCIN ) injection  Status:  Discontinued          As needed 06/29/24 1214 06/29/24 1336   06/29/24 1035  ceFAZolin  (ANCEF ) 2-4 GM/100ML-% IVPB       Note to Pharmacy: Virgil Ee: cabinet override      06/29/24 1035 06/29/24 2244        Medications  Scheduled Meds:  ascorbic acid   500  mg Oral Daily   aspirin  EC  81 mg Oral Daily   atenolol   50 mg Oral Daily   celecoxib   200 mg Oral BID   chlorzoxazone   500 mg Oral TID   cholecalciferol    Oral Daily   cyanocobalamin   500 mcg Oral Daily   cyclobenzaprine   10 mg Oral TID   doxycycline   100 mg Oral BID   furosemide   40 mg Oral Daily   gabapentin   1,200 mg Oral TID   influenza vac split trivalent PF  0.5 mL Intramuscular Tomorrow-1000   lubiprostone   48 mcg Oral Q lunch   multivitamin with minerals  1 tablet Oral q AM   oxyCODONE   15 mg Oral Q12H   pantoprazole   40 mg Oral Daily   senna  1 tablet Oral BID   sodium chloride  flush  3 mL Intravenous Q12H   Continuous Infusions:  0.9 % NaCl with KCl 20 mEq / L Stopped (07/01/24 2355)   PRN Meds:.acetaminophen  **OR** acetaminophen , albuterol , bisacodyl , diazepam , diphenhydrAMINE , magnesium  citrate, menthol  **OR** phenol, morphine  injection, naloxone , ondansetron  **OR** ondansetron  (ZOFRAN ) IV, mouth rinse, oxyCODONE , oxyCODONE , senna-docusate, sodium chloride  flush    Subjective:   [name] was seen and examined today. Lower extremity edema improved.   Objective:   Vitals:   07/23/24 0351  07/23/24 0801 07/23/24 1100 07/23/24 1503  BP: 112/67 131/71 114/71 119/69  Pulse: 83 74 71 66  Resp: 18 20 18 20   Temp: 98.1 F (36.7 C) 98.2 F (36.8 C) 98.9 F (37.2 C) 98.4 F (36.9 C)  TempSrc: Oral Oral Oral Oral  SpO2: 93% 96% 97% 100%  Height:        Intake/Output Summary (Last 24 hours) at 07/23/2024 1837 Last data filed at 07/23/2024 1700 Gross per 24 hour  Intake 560 ml  Output 1400 ml  Net -840 ml   There were no vitals filed for this visit.   Exam General exam: Appears calm and comfortable  Respiratory system: Clear to auscultation. Respiratory effort normal. Cardiovascular system: S1 & S2 heard, RRR.  Gastrointestinal system: Abdomen is nondistended, soft and nontender.  Central nervous system: Alert and oriented. No focal neurological deficits. Extremities: pedal edema resolved.  Skin: No rashes,    Data Reviewed:  I have personally reviewed following labs and imaging studies   CBC Lab Results  Component Value Date   WBC 11.3 (H) 07/21/2024   RBC 4.56 07/21/2024   HGB 11.1 (L) 07/21/2024   HCT 37.4 07/21/2024   MCV 82.0 07/21/2024   MCH 24.3 (L) 07/21/2024   PLT 512 (H) 07/21/2024   MCHC 29.7 (L) 07/21/2024   RDW 15.7 (H) 07/21/2024   LYMPHSABS 2.8 07/21/2024   MONOABS 0.6 07/21/2024   EOSABS 0.6 (H) 07/21/2024   BASOSABS 0.1 07/21/2024     Last metabolic panel Lab Results  Component Value Date   NA 140 07/21/2024   K 4.1 07/21/2024   CL 95 (L) 07/21/2024   CO2 32 07/21/2024   BUN 20 07/21/2024   CREATININE 0.79 07/21/2024   GLUCOSE 104 (H) 07/21/2024   GFRNONAA >60 07/21/2024   GFRAA 90 03/22/2020   CALCIUM  9.2 07/21/2024   PHOS 3.7 11/01/2023   PROT 6.3 (L) 07/21/2024   ALBUMIN  3.2 (L) 07/21/2024   BILITOT 0.4 07/21/2024   ALKPHOS 127 (H) 07/21/2024   AST 18 07/21/2024   ALT 13 07/21/2024   ANIONGAP 13 07/21/2024    CBG (last 3)  No results for input(s): GLUCAP in the last  72 hours.    Coagulation Profile: No  results for input(s): INR, PROTIME in the last 168 hours.   Radiology Studies: VAS US  LOWER EXTREMITY VENOUS (DVT) Result Date: 07/22/2024  Lower Venous DVT Study Patient Name:  [name]  Date of Exam:   07/22/2024 Medical Rec #: [id number]        Accession #:    [id number] Date of Birth: May 28, 1961       Patient Gender: F Patient Age:   44 years Exam Location:  [facility] Procedure:      VAS US  LOWER EXTREMITY VENOUS (DVT) Referring Phys: EKTA PATEL --------------------------------------------------------------------------------  Indications: Edema.  Risk Factors: Surgery Thoracic 9 & 10 Laminectomy and discectomy 06/29/2024. Limitations: Body habitus and poor ultrasound/tissue interface. Comparison Study: Previous RLEV on 11/19/2023 and BLEV on 03/26/2019 were negative                   for DVT. Performing Technologist: Ezzie Potters RVT, RDMS  Examination Guidelines: A complete evaluation includes B-mode imaging, spectral Doppler, color Doppler, and power Doppler as needed of all accessible portions of each vessel. Bilateral testing is considered an integral part of a complete examination. Limited examinations for reoccurring indications may be performed as noted. The reflux portion of the exam is performed with the patient in reverse Trendelenburg.  +---------+---------------+---------+-----------+----------+--------------+ RIGHT    CompressibilityPhasicitySpontaneityPropertiesThrombus Aging +---------+---------------+---------+-----------+----------+--------------+ CFV      Full           Yes      Yes                                 +---------+---------------+---------+-----------+----------+--------------+ SFJ      Full                                                        +---------+---------------+---------+-----------+----------+--------------+ FV Prox  Full           Yes      Yes                                  +---------+---------------+---------+-----------+----------+--------------+ FV Mid   Full           Yes      Yes                                 +---------+---------------+---------+-----------+----------+--------------+ FV DistalFull           Yes      Yes                                 +---------+---------------+---------+-----------+----------+--------------+ PFV      Full                                                        +---------+---------------+---------+-----------+----------+--------------+ POP      Full  Yes      Yes                                 +---------+---------------+---------+-----------+----------+--------------+ PTV      Full                                                        +---------+---------------+---------+-----------+----------+--------------+ PERO     Full                                                        +---------+---------------+---------+-----------+----------+--------------+   +---------+---------------+---------+-----------+----------+--------------+ LEFT     CompressibilityPhasicitySpontaneityPropertiesThrombus Aging +---------+---------------+---------+-----------+----------+--------------+ CFV      Full           Yes      Yes                                 +---------+---------------+---------+-----------+----------+--------------+ SFJ      Full                                                        +---------+---------------+---------+-----------+----------+--------------+ FV Prox  Full           Yes      Yes                                 +---------+---------------+---------+-----------+----------+--------------+ FV Mid   Full           Yes      Yes                                 +---------+---------------+---------+-----------+----------+--------------+ FV DistalFull           Yes      Yes                                  +---------+---------------+---------+-----------+----------+--------------+ PFV      Full                                                        +---------+---------------+---------+-----------+----------+--------------+ POP      Full           Yes      Yes                                 +---------+---------------+---------+-----------+----------+--------------+ PTV      Full                                                        +---------+---------------+---------+-----------+----------+--------------+  PERO     Full                                                        +---------+---------------+---------+-----------+----------+--------------+     Summary: BILATERAL: - No evidence of deep vein thrombosis seen in the lower extremities, bilaterally. -No evidence of popliteal cyst, bilaterally.   *See table(s) above for measurements and observations. Electronically signed by Debby Robertson on 07/22/2024 at 5:14:09 PM.    Final    DG CHEST PORT 1 VIEW Result Date: 07/22/2024 CLINICAL DATA:  Shortness of breath. EXAM: PORTABLE CHEST 1 VIEW COMPARISON:  01/13/2024 FINDINGS: Low volume film. The cardio pericardial silhouette is enlarged. Vascular congestion evident and component of interstitial edema not excluded. Streaky parahilar density bilaterally suggest atelectasis. No substantial pleural effusion. IMPRESSION: 1. Low volume film with vascular congestion and possible interstitial edema. 2. Streaky parahilar density bilaterally suggest atelectasis. Electronically Signed   By: Camellia Candle M.D.   On: 07/22/2024 10:05       Elgie Butter M.D. [facility] 07/23/2024, 6:37 PM  Available via Epic secure chat 7am-7pm After 7 pm, please refer to night coverage provider listed on amion.

## 2024-07-23 NOTE — Plan of Care (Signed)

## 2024-07-23 NOTE — Progress Notes (Signed)
 Physical Therapy Treatment Patient Details Name: [name] MRN: [id number] DOB: Apr 27, 1961 Today's Date: 07/23/2024   History of Present Illness Pt is a 63 y/o F admitted for thoracic stenosis at T9/10 and LE weakness, now s/p thoracic decompression with neurosurgery on 06/29/24. Of note, pt had T9-10 laminectomy on 01/08/24 and was admitted to [facility] for rehab. PMHx: GERD, anxiety, HTN, fibromyalgia, hyperthyroidism, IBS, ACDF 3/4, posterior lateral arthrodesis 6/16, CSF leak 6/24.    PT Comments  Pt received in the recliner and agreeable to session. Pt requires increased assist to stand from a lower surface due to pain and fatigue. Pt able to attempt standing marches with stedy support, but has limited tolerance due to RLE pain. Pt requires assist to elevate BLE to EOB, but is able to supine scoot to [facility] with BUE without assist. Pt continues to benefit from PT services to progress toward functional mobility goals.    If plan is discharge home, recommend the following: A lot of help with walking and/or transfers;A lot of help with bathing/dressing/bathroom;Assist for transportation;Help with stairs or ramp for entrance   Can travel by private vehicle        Equipment Recommendations  Other (comment) (TBD)    Recommendations for Other Services       Precautions / Restrictions Precautions Precautions: Back;Fall Precaution Booklet Issued: No Recall of Precautions/Restrictions: Intact Precaution/Restrictions Comments: R AFO Restrictions Weight Bearing Restrictions Per Provider Order: No     Mobility  Bed Mobility Overal bed mobility: Needs Assistance Bed Mobility: Rolling, Sit to Supine Rolling: Min assist, Used rails     Sit to supine: Mod assist, Used rails   General bed mobility comments: assist for BLE elevation back to EOB    Transfers Overall transfer level: Needs assistance Equipment used: Ambulation equipment used Transfers: Sit to/from Stand, Bed to  chair/wheelchair/BSC Sit to Stand: Mod assist, +2 physical assistance, Via lift equipment           General transfer comment: STS from recliner with increased assist from lower surface. Transfer via Lift Equipment: Stedy  Ambulation/Gait               General Gait Details: unable   Optometrist     Tilt Bed    Modified Rankin (Stroke Patients Only)       Balance Overall balance assessment: Needs assistance Sitting-balance support: No upper extremity supported, Feet supported, Bilateral upper extremity supported Sitting balance-[name]: Fair Sitting balance - Comments: EOB   Standing balance support: Bilateral upper extremity supported, During functional activity, Reliant on assistive device for balance Standing balance-[name]: Poor Standing balance comment: reliant on Stedy for support                            Communication Communication Communication: No apparent difficulties  Cognition Arousal: Alert Behavior During Therapy: WFL for tasks assessed/performed   PT - Cognitive impairments: No apparent impairments                         Following commands: Intact      Cueing Cueing Techniques: Verbal cues, Tactile cues, Visual cues  Exercises Other Exercises Other Exercises: BLE marches x3 each    General Comments General comments (skin integrity, edema, etc.): VSS on RA, patient required increased time for most tasks due to talkative and increased time  to initiate tasks      Pertinent Vitals/Pain Pain Assessment Pain Assessment: Faces Faces Pain Scale: Hurts even more Pain Location: back, BLE (R>L) Pain Descriptors / Indicators: Discomfort, Aching Pain Intervention(s): Monitored during session, Limited activity within patient's tolerance, Repositioned     PT Goals (current goals can now be found in the care plan section) Acute Rehab PT Goals Patient Stated Goal: to get more  therapy PT Goal Formulation: With patient Time For Goal Achievement: 07/14/24 Progress towards PT goals: Progressing toward goals    Frequency    Min 2X/week       AM-PAC PT 6 Clicks Mobility   Outcome Measure  Help needed turning from your back to your side while in a flat bed without using bedrails?: A Little Help needed moving from lying on your back to sitting on the side of a flat bed without using bedrails?: A Lot Help needed moving to and from a bed to a chair (including a wheelchair)?: Total Help needed standing up from a chair using your arms (e.g., wheelchair or bedside chair)?: A Lot Help needed to walk in hospital room?: Total Help needed climbing 3-5 steps with a railing? : Total 6 Click Score: 10    End of Session Equipment Utilized During Treatment: Gait belt Activity Tolerance: Patient tolerated treatment well;Patient limited by fatigue Patient left: in bed;with call bell/phone within reach;with nursing/sitter in room Nurse Communication: Mobility status PT Visit Diagnosis: Unsteadiness on feet (R26.81);Other abnormalities of gait and mobility (R26.89);Muscle weakness (generalized) (M62.81)     Time: 1433-1500 PT Time Calculation (min) (ACUTE ONLY): 27 min  Charges:    $Therapeutic Activity: 23-37 mins PT General Charges $$ ACUTE PT VISIT: 1 Visit                     Darryle George, PTA Acute Rehabilitation Services Secure Chat Preferred  [phone]    Darryle George 07/23/2024, 3:32 PM

## 2024-07-24 DIAGNOSIS — I5032 Chronic diastolic (congestive) heart failure: Secondary | ICD-10-CM | POA: Diagnosis not present

## 2024-07-24 DIAGNOSIS — M4804 Spinal stenosis, thoracic region: Secondary | ICD-10-CM | POA: Diagnosis not present

## 2024-07-24 DIAGNOSIS — I10 Essential (primary) hypertension: Secondary | ICD-10-CM | POA: Diagnosis not present

## 2024-07-24 LAB — BASIC METABOLIC PANEL WITH GFR
Anion gap: 10 (ref 5–15)
BUN: 18 mg/dL (ref 8–23)
CO2: 30 mmol/L (ref 22–32)
Calcium: 8.6 mg/dL — ABNORMAL LOW (ref 8.9–10.3)
Chloride: 100 mmol/L (ref 98–111)
Creatinine, Ser: 0.7 mg/dL (ref 0.44–1.00)
GFR, Estimated: >60 mL/min (ref >60)
Glucose, Bld: 85 mg/dL (ref 70–99)
Potassium: 3.5 mmol/L (ref 3.5–5.1)
Sodium: 140 mmol/L (ref 135–145)

## 2024-07-24 MED ORDER — DIPHENHYDRAMINE HCL 50 MG/ML IJ SOLN: 50.0000 mg | Freq: Once | INTRAMUSCULAR | Status: DC

## 2024-07-24 MED ORDER — POTASSIUM CHLORIDE CRYS ER 20 MEQ PO TBCR
20.0000 meq | EXTENDED_RELEASE_TABLET | Freq: Every day | ORAL | Status: DC
  Administered 2024-07-24 – 2024-09-05 (×44): 20 meq via ORAL
  Filled 2024-07-24 (×44): qty 1

## 2024-07-24 MED ORDER — PREDNISONE 5 MG PO TABS
50.0000 mg | ORAL_TABLET | Freq: Four times a day (QID) | ORAL | Status: DC
  Administered 2024-07-24: 50 mg via ORAL
  Filled 2024-07-24: qty 2

## 2024-07-24 MED ORDER — DIPHENHYDRAMINE HCL 25 MG PO CAPS: 50.0000 mg | ORAL_CAPSULE | Freq: Once | ORAL | Status: DC

## 2024-07-24 MED ORDER — PREDNISONE 5 MG PO TABS
50.0000 mg | ORAL_TABLET | Freq: Four times a day (QID) | ORAL | Status: AC
  Administered 2024-07-24 – 2024-07-25 (×2): 50 mg via ORAL
  Filled 2024-07-24 (×2): qty 2

## 2024-07-24 MED ORDER — DIPHENHYDRAMINE HCL 50 MG/ML IJ SOLN
50.0000 mg | Freq: Once | INTRAMUSCULAR | Status: AC
  Filled 2024-07-24: qty 1

## 2024-07-24 MED ORDER — ENOXAPARIN SODIUM 100 MG/ML IJ SOSY
1.0000 mg/kg | PREFILLED_SYRINGE | Freq: Two times a day (BID) | INTRAMUSCULAR | Status: DC
  Administered 2024-07-24: 100 mg via SUBCUTANEOUS
  Filled 2024-07-24 (×2): qty 1

## 2024-07-24 MED ORDER — DIPHENHYDRAMINE HCL 25 MG PO CAPS
50.0000 mg | ORAL_CAPSULE | Freq: Once | ORAL | Status: AC
  Administered 2024-07-25: 50 mg via ORAL
  Filled 2024-07-24: qty 2

## 2024-07-24 NOTE — Progress Notes (Signed)
 Chaplain- Intern Leslee received call from nurse's station asking if someone could come and talk to pt because she is very emotional. Upon arrival to see pt she was laying in crying. Pt responded and said that the medication that was given to her was burning down on the inside, which was making her upset. Pt stated that she is not suicidal, and she is just emotional because she is having to have another procedure and in very much pain. Pt thanked me for visiting with her and also told me to tell the nurse's station thank you for going the extra mile to reach out to the chaplain department.   Chaplain - Intern Natassja Ollis

## 2024-07-24 NOTE — Progress Notes (Signed)
 [facility]                                                                               [name], is a 63 y.o. female, DOB - [birth year], FMW:[id number] Admit date - 06/29/2024    Outpatient Primary MD for the patient is Leonel Cole, MD  LOS - 23  days    Brief summary    [name] is a 63 y.o. female with past medical history of essential tremor, GERD, irritable bowel syndrome admitted for laminectomy on neurosurgery service requested to consult for lower extremity edema and medical management.   Assessment & Plan    Assessment and Plan:  Bilateral lower extremity edema Possibly from diastolic CHF.  BNP elevated at 239, Echocardiogram showed LVEF of 70 to 75%, no regional wall motion abn. LV diastolic parameters consistent with grade 1 diastolic dysfunction.  Cxr showing Low volume film with vascular congestion and possible interstitial edema. Lower extremity duplex is negative DVT.  Stop the hydrochlorothiazide  and start the patient on lasix  40 mg daily.  Creatinine and electrolytes have been stable Patient reports lower extremity edema is improving but not resolved yet.  No changes in her medications   Elevated d dimer:  Check venous duplex of the lower extremity.  NM perfusion scan was discontinued as it was not available. CT chest with contrast ordered to evaluate for PE. Suspicion is low but in view of her elevated D-dimer we will evaluate for pulmonary embolism.  Hypertension:  Optimal   Back pain s/p laminectomy  Pain control.  Further management as per primary   Mild normocytic anemia;  Hemoglobin stable around 11.   GERD  Stable at protonix .    Estimated body mass index is 40.6 kg/m as calculated from the following:   Height as of this encounter: 5' 2 (1.575 m).   Weight as of this encounter: 100.7 kg.  Code Status: full code.  DVT Prophylaxis:  SCD's Start: 06/29/24 1541     Antimicrobials:   Anti-infectives  (From admission, onward)    Start     Dose/Rate Route Frequency Ordered Stop   06/30/24 0600  ceFAZolin  (ANCEF ) IVPB 2g/100 mL premix  Status:  Discontinued        2 g 200 mL/hr over 30 Minutes Intravenous On call to O.R. 06/29/24 1539 06/29/24 1832   06/29/24 2200  doxycycline  (VIBRA -TABS) tablet 100 mg        100 mg Oral 2 times daily 06/29/24 1412     06/29/24 1900  ceFAZolin  (ANCEF ) IVPB 2g/100 mL premix        2 g 200 mL/hr over 30 Minutes Intravenous Every 8 hours 06/29/24 1540 06/30/24 0341   06/29/24 1215  vancomycin  (VANCOCIN ) powder  Status:  Discontinued          As needed 06/29/24 1215 06/29/24 1336   06/29/24 1214  gentamicin  (GARAMYCIN ) injection  Status:  Discontinued          As needed 06/29/24 1214 06/29/24 1336   06/29/24 1035  ceFAZolin  (ANCEF ) 2-4 GM/100ML-% IVPB       Note to Pharmacy: Virgil Ee: cabinet override  06/29/24 1035 06/29/24 2244        Medications  Scheduled Meds:  ascorbic acid   500 mg Oral Daily   aspirin  EC  81 mg Oral Daily   atenolol   50 mg Oral Daily   celecoxib   200 mg Oral BID   chlorzoxazone   500 mg Oral TID   cholecalciferol    Oral Daily   cyanocobalamin   500 mcg Oral Daily   cyclobenzaprine   10 mg Oral TID   [START ON 07/25/2024] diphenhydrAMINE   50 mg Oral Once   Or   [START ON 07/25/2024] diphenhydrAMINE   50 mg Intravenous Once   doxycycline   100 mg Oral BID   enoxaparin  (LOVENOX ) injection  1 mg/kg Subcutaneous Q12H   furosemide   40 mg Oral Daily   gabapentin   1,200 mg Oral TID   influenza vac split trivalent PF  0.5 mL Intramuscular Tomorrow-1000   lubiprostone   48 mcg Oral Q lunch   multivitamin with minerals  1 tablet Oral q AM   oxyCODONE   15 mg Oral Q12H   pantoprazole   40 mg Oral Daily   predniSONE  50 mg Oral Q6H   senna  1 tablet Oral BID   sodium chloride  flush  3 mL Intravenous Q12H   Continuous Infusions:  0.9 % NaCl with KCl 20 mEq / L Stopped (07/01/24 2355)   PRN Meds:.acetaminophen  **OR**  acetaminophen , albuterol , bisacodyl , diazepam , diphenhydrAMINE , magnesium  citrate, menthol  **OR** phenol, morphine  injection, naloxone , ondansetron  **OR** ondansetron  (ZOFRAN ) IV, mouth rinse, oxyCODONE , oxyCODONE , senna-docusate, sodium chloride  flush    Subjective:   [name] was seen and examined today.  No new complaints  Objective:   Vitals:   07/24/24 0849 07/24/24 1116 07/24/24 1524 07/24/24 1538  BP: 119/68 113/66 118/68   Pulse: 79 74 74   Resp: 20 14 20    Temp: 97.6 F (36.4 C) 97.8 F (36.6 C) 98.6 F (37 C)   TempSrc: Oral Oral Oral   SpO2: 99% 99% 97%   Weight:    100.7 kg  Height:    5' 2 (1.575 m)    Intake/Output Summary (Last 24 hours) at 07/24/2024 1753 Last data filed at 07/24/2024 1200 Gross per 24 hour  Intake 240 ml  Output 2050 ml  Net -1810 ml   Filed Weights   07/24/24 1538  Weight: 100.7 kg     Exam General exam: Appears calm and comfortable  Respiratory system: Clear to auscultation. Respiratory effort normal. Cardiovascular system: S1 & S2 heard, RRR.  Gastrointestinal system: Abdomen is nondistended, soft and nontender.  Central nervous system: Alert and oriented.  Extremities: Symmetric 5 x 5 power. Skin: No rashes,  Psychiatry: Mood & affect appropriate.      Data Reviewed:  I have personally reviewed following labs and imaging studies   CBC Lab Results  Component Value Date   WBC 11.3 (H) 07/21/2024   RBC 4.56 07/21/2024   HGB 11.1 (L) 07/21/2024   HCT 37.4 07/21/2024   MCV 82.0 07/21/2024   MCH 24.3 (L) 07/21/2024   PLT 512 (H) 07/21/2024   MCHC 29.7 (L) 07/21/2024   RDW 15.7 (H) 07/21/2024   LYMPHSABS 2.8 07/21/2024   MONOABS 0.6 07/21/2024   EOSABS 0.6 (H) 07/21/2024   BASOSABS 0.1 07/21/2024     Last metabolic panel Lab Results  Component Value Date   NA 140 07/24/2024   K 3.5 07/24/2024   CL 100 07/24/2024   CO2 30 07/24/2024   BUN 18 07/24/2024   CREATININE 0.70 07/24/2024  GLUCOSE 85  07/24/2024   GFRNONAA >60 07/24/2024   GFRAA 90 03/22/2020   CALCIUM  8.6 (L) 07/24/2024   PHOS 3.7 11/01/2023   PROT 6.3 (L) 07/21/2024   ALBUMIN  3.2 (L) 07/21/2024   BILITOT 0.4 07/21/2024   ALKPHOS 127 (H) 07/21/2024   AST 18 07/21/2024   ALT 13 07/21/2024   ANIONGAP 10 07/24/2024    CBG (last 3)  No results for input(s): GLUCAP in the last 72 hours.    Coagulation Profile: No results for input(s): INR, PROTIME in the last 168 hours.   Radiology Studies: No results found.      Elgie Butter M.D. [facility] 07/24/2024, 5:53 PM  Available via Epic secure chat 7am-7pm After 7 pm, please refer to night coverage provider listed on amion.

## 2024-07-24 NOTE — Progress Notes (Signed)
 Providing Compassionate, Quality Care - Together   Subjective: Patient reports no new issues. Received call overnight regarding wound VAC malfunction. Nurse was able to get new wound VAC connected and operating properly.  Objective: Vital signs in last 24 hours: Temp:  [97.6 F (36.4 C)-98.9 F (37.2 C)] 97.6 F (36.4 C) (10/18 0849) Pulse Rate:  [66-82] 79 (10/18 0849) Resp:  [17-20] 20 (10/18 0849) BP: (107-119)/(56-71) 119/68 (10/18 0849) SpO2:  [90 %-100 %] 99 % (10/18 0849)  Intake/Output from previous day: 10/17 0701 - 10/18 0700 In: 560 [P.O.:560] Out: 1350 [Urine:1350] Intake/Output this shift: Total I/O In: -  Out: 900 [Urine:900]  Alert and oriented x 4 PERRLA CN II-XII grossly intact MAE, Strength and sensation intact Prevena wound VAC in place, yellow drainage in chamber   Lab Results: Recent Labs    07/21/24 1532  WBC 11.3*  HGB 11.1*  HCT 37.4  PLT 512*   BMET Recent Labs    07/21/24 1532 07/24/24 0556  NA 140 140  K 4.1 3.5  CL 95* 100  CO2 32 30  GLUCOSE 104* 85  BUN 20 18  CREATININE 0.79 0.70  CALCIUM  9.2 8.6*    Studies/Results: VAS US  LOWER EXTREMITY VENOUS (DVT) Result Date: 07/22/2024  Lower Venous DVT Study Patient Name:  [name]  Date of Exam:   07/22/2024 Medical Rec #: [id number]        Accession #:    [id number] Date of Birth: January 30, 1961       Patient Gender: F Patient Age:   63 years Exam Location:  [facility] Procedure:      VAS US  LOWER EXTREMITY VENOUS (DVT) Referring Phys: EKTA PATEL --------------------------------------------------------------------------------  Indications: Edema.  Risk Factors: Surgery Thoracic 9 & 10 Laminectomy and discectomy 06/29/2024. Limitations: Body habitus and poor ultrasound/tissue interface. Comparison Study: Previous RLEV on 11/19/2023 and BLEV on 03/26/2019 were negative                   for DVT. Performing Technologist: Ezzie Potters RVT, RDMS  Examination Guidelines: A  complete evaluation includes B-mode imaging, spectral Doppler, color Doppler, and power Doppler as needed of all accessible portions of each vessel. Bilateral testing is considered an integral part of a complete examination. Limited examinations for reoccurring indications may be performed as noted. The reflux portion of the exam is performed with the patient in reverse Trendelenburg.  +---------+---------------+---------+-----------+----------+--------------+ RIGHT    CompressibilityPhasicitySpontaneityPropertiesThrombus Aging +---------+---------------+---------+-----------+----------+--------------+ CFV      Full           Yes      Yes                                 +---------+---------------+---------+-----------+----------+--------------+ SFJ      Full                                                        +---------+---------------+---------+-----------+----------+--------------+ FV Prox  Full           Yes      Yes                                 +---------+---------------+---------+-----------+----------+--------------+ FV Mid  Full           Yes      Yes                                 +---------+---------------+---------+-----------+----------+--------------+ FV DistalFull           Yes      Yes                                 +---------+---------------+---------+-----------+----------+--------------+ PFV      Full                                                        +---------+---------------+---------+-----------+----------+--------------+ POP      Full           Yes      Yes                                 +---------+---------------+---------+-----------+----------+--------------+ PTV      Full                                                        +---------+---------------+---------+-----------+----------+--------------+ PERO     Full                                                         +---------+---------------+---------+-----------+----------+--------------+   +---------+---------------+---------+-----------+----------+--------------+ LEFT     CompressibilityPhasicitySpontaneityPropertiesThrombus Aging +---------+---------------+---------+-----------+----------+--------------+ CFV      Full           Yes      Yes                                 +---------+---------------+---------+-----------+----------+--------------+ SFJ      Full                                                        +---------+---------------+---------+-----------+----------+--------------+ FV Prox  Full           Yes      Yes                                 +---------+---------------+---------+-----------+----------+--------------+ FV Mid   Full           Yes      Yes                                 +---------+---------------+---------+-----------+----------+--------------+ FV DistalFull  Yes      Yes                                 +---------+---------------+---------+-----------+----------+--------------+ PFV      Full                                                        +---------+---------------+---------+-----------+----------+--------------+ POP      Full           Yes      Yes                                 +---------+---------------+---------+-----------+----------+--------------+ PTV      Full                                                        +---------+---------------+---------+-----------+----------+--------------+ PERO     Full                                                        +---------+---------------+---------+-----------+----------+--------------+     Summary: BILATERAL: - No evidence of deep vein thrombosis seen in the lower extremities, bilaterally. -No evidence of popliteal cyst, bilaterally.   *See table(s) above for measurements and observations. Electronically signed by Debby Robertson on 07/22/2024 at 5:14:09 PM.     Final     Assessment/Plan: Patient underwent a T10-L3 posterior lateral fusion by Dr. Gillie on 03/22/2022. She developed wound drainage and was taken back to the OR for wound revision on 03/30/2022. She underwent decompression at T9-10 by Dr. Gillie on 01/08/2024 due to infection. She has a history of discitis/osteomyelitis that grew Enterobacter cloacae from a wound culture performed on 04/08/2022.  Patient underwent thoracic decompression once again by Dr. Gillie on 06/29/2024. Surgical wound culture with no growth.   LOS: 23 days   -Continue wound VAC. -Therapies recommending SNF at discharge. -Appreciate [facility]'s input and care of this patient.  I am in communication with my attending and they agree with the plan for this patient.   Gerard Beck, DNP, AGNP-C Nurse Practitioner  [facility] [address], [place], [name] [address] P: [phone]    F: [phone]  07/24/2024, 9:48 AM

## 2024-07-25 ENCOUNTER — Inpatient Hospital Stay (HOSPITAL_COMMUNITY)

## 2024-07-25 DIAGNOSIS — R911 Solitary pulmonary nodule: Secondary | ICD-10-CM | POA: Diagnosis not present

## 2024-07-25 DIAGNOSIS — M4804 Spinal stenosis, thoracic region: Secondary | ICD-10-CM | POA: Diagnosis not present

## 2024-07-25 DIAGNOSIS — I10 Essential (primary) hypertension: Secondary | ICD-10-CM | POA: Diagnosis not present

## 2024-07-25 DIAGNOSIS — I5032 Chronic diastolic (congestive) heart failure: Secondary | ICD-10-CM | POA: Diagnosis not present

## 2024-07-25 MED ORDER — RIVAROXABAN 10 MG PO TABS
10.0000 mg | ORAL_TABLET | Freq: Every day | ORAL | Status: DC
  Administered 2024-07-25 – 2024-09-05 (×43): 10 mg via ORAL
  Filled 2024-07-25 (×43): qty 1

## 2024-07-25 MED ORDER — IOHEXOL 350 MG/ML SOLN
75.0000 mL | Freq: Once | INTRAVENOUS | Status: AC | PRN
  Administered 2024-07-25: 75 mL via INTRAVENOUS

## 2024-07-25 MED ORDER — ENOXAPARIN SODIUM 60 MG/0.6ML IJ SOSY
0.5000 mg/kg | PREFILLED_SYRINGE | INTRAMUSCULAR | Status: DC
  Filled 2024-07-25: qty 0.6

## 2024-07-25 NOTE — Plan of Care (Signed)

## 2024-07-25 NOTE — Plan of Care (Signed)
  Problem: Education: Goal: Knowledge of General Education information will improve Description: Including pain rating scale, medication(s)/side effects and non-pharmacologic comfort measures Outcome: Progressing   Problem: Clinical Measurements: Goal: Ability to maintain clinical measurements within normal limits will improve Outcome: Progressing Goal: Will remain free from infection Outcome: Progressing   Problem: Activity: Goal: Risk for activity intolerance will decrease Outcome: Progressing   Problem: Nutrition: Goal: Adequate nutrition will be maintained Outcome: Progressing   Problem: Coping: Goal: Level of anxiety will decrease Outcome: Progressing   Problem: Pain Managment: Goal: General experience of comfort will improve and/or be controlled Outcome: Progressing   Problem: Clinical Measurements: Goal: Ability to maintain clinical measurements within normal limits will improve Outcome: Progressing

## 2024-07-25 NOTE — Progress Notes (Signed)
 [facility]                                                                               [name], is a 63 y.o. female, DOB - 12/10/1960, FMW:[id number] Admit date - 06/29/2024    Outpatient Primary MD for the patient is Jill Cole, MD  LOS - 24  days    Brief summary    [name] is a 63 y.o. female with past medical history of essential tremor, GERD, irritable bowel syndrome admitted for laminectomy on neurosurgery service requested to consult for lower extremity edema and medical management.   Assessment & Plan    Assessment and Plan:  Bilateral lower extremity edema Possibly from diastolic CHF.  BNP elevated at 239, Echocardiogram showed LVEF of 70 to 75%, no regional wall motion abn. LV diastolic parameters consistent with grade 1 diastolic dysfunction.  Cxr showing Low volume film with vascular congestion and possible interstitial edema. Lower extremity duplex is negative DVT.  Stop the hydrochlorothiazide  and start the patient on lasix  40 mg daily.  Creatinine and electrolytes have been stable Patient reports lower extremity edema is improving but not resolved yet.  No changes in her medications   Elevated d dimer:  Check venous duplex of the lower extremity.  NM perfusion scan was discontinued as it was not available. Suspicion is low but in view of her elevated D-dimer CT chest with contrast ordered and is negative for PE.   Hypertension:  Optimal   Back pain s/p laminectomy  Pain control.  Further management as per primary   Mild normocytic anemia;  Hemoglobin stable around 11.   GERD  Stable at protonix .   Patient refused lovneox for DVT prophylaxis. So changed to xarelto.    Estimated body mass index is 40.6 kg/m as calculated from the following:   Height as of this encounter: 5' 2 (1.575 m).   Weight as of this encounter: 100.7 kg.  Code Status: full code.  DVT Prophylaxis:  rivaroxaban (XARELTO) tablet 10 mg Start:  07/25/24 1230 SCD's Start: 06/29/24 1541 rivaroxaban (XARELTO) tablet 10 mg     Antimicrobials:   Anti-infectives (From admission, onward)    Start     Dose/Rate Route Frequency Ordered Stop   06/30/24 0600  ceFAZolin  (ANCEF ) IVPB 2g/100 mL premix  Status:  Discontinued        2 g 200 mL/hr over 30 Minutes Intravenous On call to O.R. 06/29/24 1539 06/29/24 1832   06/29/24 2200  doxycycline  (VIBRA -TABS) tablet 100 mg        100 mg Oral 2 times daily 06/29/24 1412     06/29/24 1900  ceFAZolin  (ANCEF ) IVPB 2g/100 mL premix        2 g 200 mL/hr over 30 Minutes Intravenous Every 8 hours 06/29/24 1540 06/30/24 0341   06/29/24 1215  vancomycin  (VANCOCIN ) powder  Status:  Discontinued          As needed 06/29/24 1215 06/29/24 1336   06/29/24 1214  gentamicin  (GARAMYCIN ) injection  Status:  Discontinued          As needed 06/29/24 1214 06/29/24 1336   06/29/24 1035  ceFAZolin  (ANCEF )  2-4 GM/100ML-% IVPB       Note to Pharmacy: Virgil Ee: cabinet override      06/29/24 1035 06/29/24 2244        Medications  Scheduled Meds:  ascorbic acid   500 mg Oral Daily   aspirin  EC  81 mg Oral Daily   atenolol   50 mg Oral Daily   celecoxib   200 mg Oral BID   chlorzoxazone   500 mg Oral TID   cholecalciferol    Oral Daily   cyanocobalamin   500 mcg Oral Daily   cyclobenzaprine   10 mg Oral TID   doxycycline   100 mg Oral BID   furosemide   40 mg Oral Daily   gabapentin   1,200 mg Oral TID   influenza vac split trivalent PF  0.5 mL Intramuscular Tomorrow-1000   lubiprostone   48 mcg Oral Q lunch   multivitamin with minerals  1 tablet Oral q AM   oxyCODONE   15 mg Oral Q12H   pantoprazole   40 mg Oral Daily   potassium chloride   20 mEq Oral Daily   rivaroxaban  10 mg Oral Daily   senna  1 tablet Oral BID   sodium chloride  flush  3 mL Intravenous Q12H   Continuous Infusions:   PRN Meds:.acetaminophen  **OR** acetaminophen , albuterol , bisacodyl , diazepam , diphenhydrAMINE , magnesium  citrate,  menthol  **OR** phenol, morphine  injection, naloxone , ondansetron  **OR** ondansetron  (ZOFRAN ) IV, mouth rinse, oxyCODONE , oxyCODONE , senna-docusate, sodium chloride  flush    Subjective:   [name] was seen and examined today.  No new complaints.   Objective:   Vitals:   07/25/24 0354 07/25/24 0737 07/25/24 1125 07/25/24 1537  BP: (!) 156/77 (!) 158/79 132/79 (!) 116/57  Pulse:  78 80 91  Resp:  17 18 18   Temp: (!) 97.4 F (36.3 C) (!) 97.1 F (36.2 C) 98.4 F (36.9 C) 98.8 F (37.1 C)  TempSrc: Oral Axillary Oral Oral  SpO2: 96% 94% 94% 96%  Weight:      Height:        Intake/Output Summary (Last 24 hours) at 07/25/2024 1824 Last data filed at 07/25/2024 1820 Gross per 24 hour  Intake 50 ml  Output 2100 ml  Net -2050 ml   Filed Weights   07/24/24 1538  Weight: 100.7 kg     Exam General exam: Appears calm and comfortable  Respiratory system: Clear to auscultation. Respiratory effort normal. Cardiovascular system: S1 & S2 heard, RRR.  Gastrointestinal system: Abdomen is nondistended, soft and nontender.  Central nervous system: Alert and oriented.  Extremities: Symmetric 5 x 5 power. Skin: No rashes,  Psychiatry: Mood & affect appropriate.      Data Reviewed:  I have personally reviewed following labs and imaging studies   CBC Lab Results  Component Value Date   WBC 11.3 (H) 07/21/2024   RBC 4.56 07/21/2024   HGB 11.1 (L) 07/21/2024   HCT 37.4 07/21/2024   MCV 82.0 07/21/2024   MCH 24.3 (L) 07/21/2024   PLT 512 (H) 07/21/2024   MCHC 29.7 (L) 07/21/2024   RDW 15.7 (H) 07/21/2024   LYMPHSABS 2.8 07/21/2024   MONOABS 0.6 07/21/2024   EOSABS 0.6 (H) 07/21/2024   BASOSABS 0.1 07/21/2024     Last metabolic panel Lab Results  Component Value Date   NA 140 07/24/2024   K 3.5 07/24/2024   CL 100 07/24/2024   CO2 30 07/24/2024   BUN 18 07/24/2024   CREATININE 0.70 07/24/2024   GLUCOSE 85 07/24/2024   GFRNONAA >60 07/24/2024   GFRAA 90  03/22/2020   CALCIUM  8.6 (L) 07/24/2024   PHOS 3.7 11/01/2023   PROT 6.3 (L) 07/21/2024   ALBUMIN  3.2 (L) 07/21/2024   BILITOT 0.4 07/21/2024   ALKPHOS 127 (H) 07/21/2024   AST 18 07/21/2024   ALT 13 07/21/2024   ANIONGAP 10 07/24/2024    CBG (last 3)  No results for input(s): GLUCAP in the last 72 hours.    Coagulation Profile: No results for input(s): INR, PROTIME in the last 168 hours.   Radiology Studies: CT Angio Chest Pulmonary Embolism (PE) W or WO Contrast Result Date: 07/25/2024 EXAM: CTA CHEST 07/25/2024 03:27:14 AM TECHNIQUE: CTA of the chest was performed without and with the administration of 75 mL of intravenous iohexol (OMNIPAQUE) 350 MG/ML injection. Multiplanar reformatted images are provided for review. MIP images are provided for review. Automated exposure control, iterative reconstruction, and/or weight based adjustment of the mA/kV was utilized to reduce the radiation dose to as low as reasonably achievable. COMPARISON: Portable chest 07/22/2024, portable chest 01/13/2024, and portable chest 11/26/2023. Limited comparison is also available with a thoracic spine CT 03/11/2023. CLINICAL HISTORY: evaluate for PE. FINDINGS: PULMONARY ARTERIES: Pulmonary arteries are adequately opacified for evaluation. No acute pulmonary embolus. Main pulmonary artery is normal in caliber. MEDIASTINUM: The heart is slightly enlarged with a left chamber predominance and no venous dilatation. There are minimal atherosclerotic changes of the thoracic aorta without aneurysm, dissection or stenosis. The great vessels are clear. No coronary calcifications seen. LYMPH NODES: No mediastinal, hilar or axillary lymphadenopathy. LUNGS AND PLEURA: There is a small layering left pleural effusion with overlying compressive atelectasis or consolidation in the left lower lobe. There is no right pleural effusion. There are mild posterior atelectatic changes again in both lungs. There is a new pleural-based  7 mm noncalcified nodule in the superior segment of the right lower lobe on axial 41 of series 7. There are occasional linear scarlike opacities in the bases. No further nodules. The remaining visualized lung parenchyma is clear. No pneumothorax. UPPER ABDOMEN: In the abdomen, the liver is mildly steatotic. There is a hyperenhancing lesion in the liver dome in segment 4 on axial 90 series 5, this is probably a flash-filling hemangioma. MRI without contrast is recommended to confirm a benign process. No other significant upper abdominal findings. SOFT TISSUES AND BONES: There is partially visible multilevel anterior plate fusion hardware in the lower cervical spine ending at C7. There is partially seen multilevel lower thoracic to lumbar dorsal fusion hardware beginning at T10 with advanced adjacent segment disease at T9-T10 including severe acquired foraminal stenosis and increasing vacuum disc phenomenon. There is additional severe adjacent segment disease at C7-T1. No acute soft tissue abnormality. IMPRESSION: 1. No evidence of pulmonary embolism. 2. Small layering left pleural effusion with overlying compressive atelectasis or consolidation in the left lower lobe. No right pleural effusion. 3. New 7 mm pleural-based noncalcified nodule in the superior segment of the right lower lobe. Follow-up chest CT recommended in 6 to 12 months, then again at 18 to 24 months from today in a high risk patient. In low-risk patients, further follow-up CT after initial follow-up revealing stability is optional. 4. Hyperenhancing lesion in hepatic segment 4, probably a flash-filling hemangioma; MRI without contrast is recommended to confirm a benign process. 5. Cervical and lower thoracic/lumbar fusion hardware with severe adjacent segment disease at c7-T1 and T9-T10. Electronically signed by: Francis Quam MD 07/25/2024 04:44 AM EDT RP Workstation: [id number]        Elgie Butter M.D.  [facility] 07/25/2024, 6:24  PM  Available via Epic secure chat 7am-7pm After 7 pm, please refer to night coverage provider listed on amion.

## 2024-07-25 NOTE — Progress Notes (Signed)
 Providing Compassionate, Quality Care - Together   Subjective: Patient reports the Lovenox  injection yesterday evening caused severe burning at the injection site. She would like to try something else if possible.  Objective: Vital signs in last 24 hours: Temp:  [97.1 F (36.2 C)-98.6 F (37 C)] 97.1 F (36.2 C) (10/19 0737) Pulse Rate:  [74-83] 78 (10/19 0737) Resp:  [14-20] 17 (10/19 0737) BP: (113-158)/(62-79) 158/79 (10/19 0737) SpO2:  [94 %-100 %] 94 % (10/19 0737) Weight:  [100.7 kg] 100.7 kg (10/18 1538)  Intake/Output from previous day: 10/18 0701 - 10/19 0700 In: 560 [P.O.:560] Out: 2850 [Urine:2850] Intake/Output this shift: Total I/O In: -  Out: 450 [Urine:450]  Alert and oriented x 4 PERRLA CN II-XII grossly intact MAE, Strength and sensation intact Prevena wound VAC in place, yellow drainage in chamber  Lab Results: No results for input(s): WBC, HGB, HCT, PLT in the last 72 hours. BMET Recent Labs    07/24/24 0556  NA 140  K 3.5  CL 100  CO2 30  GLUCOSE 85  BUN 18  CREATININE 0.70  CALCIUM  8.6*    Studies/Results: CT Angio Chest Pulmonary Embolism (PE) W or WO Contrast Result Date: 07/25/2024 EXAM: CTA CHEST 07/25/2024 03:27:14 AM TECHNIQUE: CTA of the chest was performed without and with the administration of 75 mL of intravenous iohexol (OMNIPAQUE) 350 MG/ML injection. Multiplanar reformatted images are provided for review. MIP images are provided for review. Automated exposure control, iterative reconstruction, and/or weight based adjustment of the mA/kV was utilized to reduce the radiation dose to as low as reasonably achievable. COMPARISON: Portable chest 07/22/2024, portable chest 01/13/2024, and portable chest 11/26/2023. Limited comparison is also available with a thoracic spine CT 03/11/2023. CLINICAL HISTORY: evaluate for PE. FINDINGS: PULMONARY ARTERIES: Pulmonary arteries are adequately opacified for evaluation. No acute pulmonary  embolus. Main pulmonary artery is normal in caliber. MEDIASTINUM: The heart is slightly enlarged with a left chamber predominance and no venous dilatation. There are minimal atherosclerotic changes of the thoracic aorta without aneurysm, dissection or stenosis. The great vessels are clear. No coronary calcifications seen. LYMPH NODES: No mediastinal, hilar or axillary lymphadenopathy. LUNGS AND PLEURA: There is a small layering left pleural effusion with overlying compressive atelectasis or consolidation in the left lower lobe. There is no right pleural effusion. There are mild posterior atelectatic changes again in both lungs. There is a new pleural-based 7 mm noncalcified nodule in the superior segment of the right lower lobe on axial 41 of series 7. There are occasional linear scarlike opacities in the bases. No further nodules. The remaining visualized lung parenchyma is clear. No pneumothorax. UPPER ABDOMEN: In the abdomen, the liver is mildly steatotic. There is a hyperenhancing lesion in the liver dome in segment 4 on axial 90 series 5, this is probably a flash-filling hemangioma. MRI without contrast is recommended to confirm a benign process. No other significant upper abdominal findings. SOFT TISSUES AND BONES: There is partially visible multilevel anterior plate fusion hardware in the lower cervical spine ending at C7. There is partially seen multilevel lower thoracic to lumbar dorsal fusion hardware beginning at T10 with advanced adjacent segment disease at T9-T10 including severe acquired foraminal stenosis and increasing vacuum disc phenomenon. There is additional severe adjacent segment disease at C7-T1. No acute soft tissue abnormality. IMPRESSION: 1. No evidence of pulmonary embolism. 2. Small layering left pleural effusion with overlying compressive atelectasis or consolidation in the left lower lobe. No right pleural effusion. 3. New 7 mm  pleural-based noncalcified nodule in the superior segment  of the right lower lobe. Follow-up chest CT recommended in 6 to 12 months, then again at 18 to 24 months from today in a high risk patient. In low-risk patients, further follow-up CT after initial follow-up revealing stability is optional. 4. Hyperenhancing lesion in hepatic segment 4, probably a flash-filling hemangioma; MRI without contrast is recommended to confirm a benign process. 5. Cervical and lower thoracic/lumbar fusion hardware with severe adjacent segment disease at c7-T1 and T9-T10. Electronically signed by: Francis Quam MD 07/25/2024 04:44 AM EDT RP Workstation: [id number]    Assessment/Plan: Patient underwent a T10-L3 posterior lateral fusion by Dr. Gillie on 03/22/2022. She developed wound drainage and was taken back to the OR for wound revision on 03/30/2022. She underwent decompression at T9-10 by Dr. Gillie on 01/08/2024 due to infection. She has a history of discitis/osteomyelitis that grew Enterobacter cloacae from a wound culture performed on 04/08/2022.  Patient underwent thoracic decompression once again by Dr. Gillie on 06/29/2024. Surgical wound culture with no growth.   LOS: 24 days   -Dr. Cherlyn will look into making a change to patient's DVT prophylaxis. -Continue wound VAC. -Therapies recommending SNF at discharge.  I am in communication with my attending and they agree with the plan for this patient.   Gerard Beck, DNP, AGNP-C Nurse Practitioner  [facility] [address], [address], [place], [name] [address] P: [phone]    F: [phone]  07/25/2024, 9:33 AM

## 2024-07-25 NOTE — Progress Notes (Addendum)
 PHARMACY - ANTICOAGULATION CONSULT NOTE  Pharmacy Consult for Enoxaparin  dosing Indication: VTE prophylaxis  Allergies  Allergen Reactions   Monosodium Glutamate Anaphylaxis and Swelling    Eyes swollen shut, facial swelling, tongue swelling.   Shellfish Allergy Anaphylaxis   Melatonin Other (See Comments)    Muscle spasms involuntary movement   Baclofen Nausea Only    Dizziness and increase muscle spasm   Celebrex  [Celecoxib ] Itching    Only allergic to generic brand   Ciprofloxacin  Other (See Comments)    inability to walk/Unable to walk   Contrast Media [Iodinated Contrast Media] Itching and Nausea Only    could not walk   Diclofenac  Itching    Generic Diclefenac gel causes itching. Can take the name brand Voltaren  gel   Gadolinium     Allergic to MRI contrast dye per patient.   Molds & Smuts Other (See Comments)    Causes allergies to flair up/sinus issues/headaches     Other Other (See Comments)    Pet dander,        Promethazine  Itching   Statins Other (See Comments)    pain   Sulfa  Antibiotics Other (See Comments)    Rash, migraine    Latex Itching and Rash    Latex glove with powder; bite blocks used for EGD studies    Patient Measurements: Height: 5' 2 (157.5 cm) Weight: 100.7 kg (222 lb 0.1 oz) IBW/kg (Calculated) : 50.1 HEPARIN  DW (KG): 74  Vital Signs: Temp: 97.1 F (36.2 C) (10/19 0737) Temp Source: Axillary (10/19 0737) BP: 158/79 (10/19 0737) Pulse Rate: 78 (10/19 0737)  Labs: Recent Labs    07/24/24 0556  CREATININE 0.70    Estimated Creatinine Clearance: 82 mL/min (by C-G formula based on SCr of 0.7 mg/dL).   Medical History: Past Medical History:  Diagnosis Date   Anemia    Ankle syndesmosis disruption    Anxiety    takes Ativan  and Valium , after mother passed   Arthritis    bilateral knees s/p knee replacement bilaterally   Asthma    Bronchitis    Bruising    pt states unexplained d/t fibromyalgia   Chronic back pain     2012 tailbone surgery and 3 lower discs.    Closed fracture of distal end of right fibula and tibia    Depression    from Fibromyalgia diagnosis; not taking medicine. since 2001   Dizziness    rarely   Fibromyalgia    diagnosed 2001   GERD (gastroesophageal reflux disease)    Prilosec occasionally   Headache(784.0)    sinus headaches   History of hiatal hernia    Hypertension    since 2013   Hyperthyroidism    subclinical, no treatment; thyroid  nodules   IBS (irritable bowel syndrome)    Impaired memory    states from fibromyalgia   Insomnia    takes Ambien    Jones fracture    left foot fifth metatarsal   Multiple allergies    including latex, pet dander, shellfish, pet dander   Painful orthopaedic hardware right ankle 05/27/2016   Seasonal allergies    Shortness of breath    Occasional with exertion;    Sleep apnea    Sore gums    this is why pt is on Amoxil-only takes for dental work   Tachycardia    Thyroid  goiter    Varicose vein    protrudes above skin-per pt;vein popped and bruised;ultrasound done to make sure that there were no  clots;noclots were found    Medications:  Medications Prior to Admission  Medication Sig Dispense Refill Last Dose/Taking   acetaminophen  (TYLENOL ) 500 MG tablet Take 500-1,000 mg by mouth every 6 (six) hours as needed (pain.).   Past Month   albuterol  (VENTOLIN  HFA) 108 (90 Base) MCG/ACT inhaler Inhale 2 puffs into the lungs every 6 (six) hours as needed for wheezing or shortness of breath. 18 g 0 06/29/2024 at  5:00 AM   ASCORBIC ACID  PO Take 2 tablets by mouth in the morning. Gummies   06/28/2024   aspirin  EC 81 MG tablet Take 81 mg by mouth in the morning.   Past Month   atenolol  (TENORMIN ) 50 MG tablet Take 1 tablet (50 mg total) by mouth daily. 30 tablet 0 06/29/2024 at  5:00 AM   CELEBREX  200 MG capsule Take 200 mg by mouth 2 (two) times daily.   Past Week   chlorzoxazone  (PARAFON ) 500 MG tablet Take 500 mg by mouth in the  morning, at noon, in the evening, and at bedtime.   Past Week   Cholecalciferol  (VITAMIN D -3 PO) Take 1 tablet by mouth in the morning.   06/28/2024   Cyanocobalamin  (VITAMIN B-12 PO) Take 1 tablet by mouth in the morning.   06/28/2024   cyclobenzaprine  (FLEXERIL ) 10 MG tablet Take 10 mg by mouth 3 (three) times daily.   06/29/2024 at  5:00 AM   diazepam  (VALIUM ) 5 MG tablet Take 5 mg by mouth in the morning and at bedtime.   06/29/2024 at  5:00 AM   diphenhydrAMINE  (BENADRYL ) 25 MG tablet Take 50 mg by mouth in the morning, at noon, in the evening, and at bedtime.   06/29/2024 at  5:00 AM   doxycycline  (VIBRA -TABS) 100 MG tablet Take 100 mg by mouth 2 (two) times daily.   06/29/2024 at  5:00 AM   gabapentin  (NEURONTIN ) 600 MG tablet Take 2 tablets (1,200 mg total) by mouth 3 (three) times daily. 180 tablet 0 06/29/2024 at  5:00 AM   hydrocerin (EUCERIN) CREA Apply 1 Application topically 2 (two) times daily. (Patient taking differently: Apply 1 Application topically 2 (two) times daily as needed (skin irritation).)   Unknown   hydrochlorothiazide  (HYDRODIURIL ) 25 MG tablet Take 25 mg by mouth in the morning.   Past Week   lubiprostone  (AMITIZA ) 24 MCG capsule Take 1 capsule (24 mcg total) by mouth 2 (two) times daily with a meal. (Patient taking differently: Take 48 mcg by mouth daily with lunch.) 60 capsule 0 06/29/2024 at  5:00 AM   methocarbamol  (ROBAXIN ) 500 MG tablet Take 500 mg by mouth 4 (four) times daily.   06/29/2024 at  5:00 AM   Multiple Minerals-Vitamins (CALCIUM -MAGNESIUM -ZINC -D3 PO) Take 1 tablet by mouth in the morning.   06/28/2024   Multiple Vitamin (MULTIVITAMIN WITH MINERALS) TABS tablet Take 1 tablet by mouth in the morning. Women's Multivitamin   06/28/2024   naloxone  (NARCAN ) nasal spray 4 mg/0.1 mL Place 1 spray into the nose once as needed (opioid overdose). 2 each 0 Taking As Needed   omeprazole (PRILOSEC) 20 MG capsule Take 20 mg by mouth in the morning and at bedtime.    06/29/2024 at   5:00 AM   oxyCODONE -acetaminophen  (PERCOCET) 10-325 MG tablet Take 1 tablet by mouth 5 (five) times daily.   06/29/2024 at  5:00 AM   polyethylene glycol powder (GLYCOLAX /MIRALAX ) 17 GM/SCOOP powder Take 17 g by mouth daily. 476 g 0 06/28/2024   VOLTAREN  1 %  GEL Apply 1 Application topically 4 (four) times daily as needed (pain.).   06/28/2024   cefadroxil  (DURICEF) 500 MG capsule Take 2 capsules (1,000 mg total) by mouth 2 (two) times daily. (Patient not taking: Reported on 06/22/2024) 120 capsule 1 Not Taking   Scheduled:   ascorbic acid   500 mg Oral Daily   aspirin  EC  81 mg Oral Daily   atenolol   50 mg Oral Daily   celecoxib   200 mg Oral BID   chlorzoxazone   500 mg Oral TID   cholecalciferol    Oral Daily   cyanocobalamin   500 mcg Oral Daily   cyclobenzaprine   10 mg Oral TID   doxycycline   100 mg Oral BID   enoxaparin  (LOVENOX ) injection  0.5 mg/kg Subcutaneous Q24H   furosemide   40 mg Oral Daily   gabapentin   1,200 mg Oral TID   influenza vac split trivalent PF  0.5 mL Intramuscular Tomorrow-1000   lubiprostone   48 mcg Oral Q lunch   multivitamin with minerals  1 tablet Oral q AM   oxyCODONE   15 mg Oral Q12H   pantoprazole   40 mg Oral Daily   potassium chloride   20 mEq Oral Daily   senna  1 tablet Oral BID   sodium chloride  flush  3 mL Intravenous Q12H    Assessment: Patient was started on enoxaparin  empiric treatment dosing (~1 mg/kg) BID prior to chest CT for pulmonary embolus rule out yesterday. Today, chest CT is negative for pulmonary embolus. Patient is now being transitioned to DVT prophylaxis dosing of enoxaparin  (~0.5 mg/kg daily). Patient CBC and renal function are stable.  Goal of Therapy:  Monitor platelets by anticoagulation protocol: Yes   Plan:  Lovenox  50mg  (~0.5 mg/kg) subcutaneous daily for DVT prophylaxis  Thank you for allowing pharmacy to be involved with this patient's care.  Mendel Barter, PharmD PGY1 Clinical Pharmacist [facility]  07/25/2024 10:04 AM

## 2024-07-26 DIAGNOSIS — I10 Essential (primary) hypertension: Secondary | ICD-10-CM | POA: Diagnosis not present

## 2024-07-26 DIAGNOSIS — M4804 Spinal stenosis, thoracic region: Secondary | ICD-10-CM | POA: Diagnosis not present

## 2024-07-26 DIAGNOSIS — I5032 Chronic diastolic (congestive) heart failure: Secondary | ICD-10-CM | POA: Diagnosis not present

## 2024-07-26 LAB — CBC WITH DIFFERENTIAL/PLATELET
Abs Immature Granulocytes: 0.03 K/uL (ref 0.00–0.07)
Basophils Absolute: 0.1 K/uL (ref 0.0–0.1)
Basophils Relative: 1 %
Eosinophils Absolute: 0.2 K/uL (ref 0.0–0.5)
Eosinophils Relative: 2 %
HCT: 35.5 % — ABNORMAL LOW (ref 36.0–46.0)
Hemoglobin: 10.6 g/dL — ABNORMAL LOW (ref 12.0–15.0)
Immature Granulocytes: 0 %
Lymphocytes Relative: 28 %
Lymphs Abs: 3 K/uL (ref 0.7–4.0)
MCH: 24.4 pg — ABNORMAL LOW (ref 26.0–34.0)
MCHC: 29.9 g/dL — ABNORMAL LOW (ref 30.0–36.0)
MCV: 81.8 fL (ref 80.0–100.0)
Monocytes Absolute: 0.7 K/uL (ref 0.1–1.0)
Monocytes Relative: 7 %
Neutro Abs: 6.8 K/uL (ref 1.7–7.7)
Neutrophils Relative %: 62 %
Platelets: 392 K/uL (ref 150–400)
RBC: 4.34 MIL/uL (ref 3.87–5.11)
RDW: 15.4 % (ref 11.5–15.5)
WBC: 10.8 K/uL — ABNORMAL HIGH (ref 4.0–10.5)
nRBC: 0 % (ref 0.0–0.2)

## 2024-07-26 LAB — BASIC METABOLIC PANEL WITH GFR
Anion gap: 13 (ref 5–15)
BUN: 22 mg/dL (ref 8–23)
CO2: 27 mmol/L (ref 22–32)
Calcium: 8.8 mg/dL — ABNORMAL LOW (ref 8.9–10.3)
Chloride: 101 mmol/L (ref 98–111)
Creatinine, Ser: 0.79 mg/dL (ref 0.44–1.00)
GFR, Estimated: >60 mL/min (ref >60)
Glucose, Bld: 95 mg/dL (ref 70–99)
Potassium: 3.6 mmol/L (ref 3.5–5.1)
Sodium: 141 mmol/L (ref 135–145)

## 2024-07-26 NOTE — Progress Notes (Signed)
 Patient ID: [name], female   DOB: 1961/04/20, 63 y.o.   MRN: [id number] BP 116/65 (BP Location: Left Arm)   Pulse 73   Temp 98.2 F (36.8 C) (Oral)   Resp 18   Ht 5' 2 (1.575 m)   Wt 100.7 kg   LMP 11/20/2012   SpO2 95%   BMI 40.60 kg/m  Alert and oriented x 4, speech clear Moving all extremities Wound vac in place Still needing therapy. 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

## 2024-07-26 NOTE — Progress Notes (Signed)
 [facility]                                                                               Mliss Wedin, is a 63 y.o. female, DOB - 07-31-1961, FMW:[id number] Admit date - 06/29/2024    Outpatient Primary MD for the patient is Leonel Cole, MD  LOS - 25  days    Brief summary    [name] is a 63 y.o. female with past medical history of essential tremor, GERD, irritable bowel syndrome admitted for laminectomy on neurosurgery service requested to consult for lower extremity edema and medical management.   Assessment & Plan    Assessment and Plan:  Bilateral lower extremity edema Possibly from diastolic CHF.  BNP elevated at 239, Echocardiogram showed LVEF of 70 to 75%, no regional wall motion abn. LV diastolic parameters consistent with grade 1 diastolic dysfunction.  Cxr showing Low volume film with vascular congestion and possible interstitial edema. Lower extremity duplex is negative DVT.  Stop the hydrochlorothiazide  and start the patient on lasix  40 mg daily.  Creatinine and electrolytes have been stable. Repeat labs today showed creatinine stable and diuresing well.   Patient reports lower extremity edema is slowly improving.  No changes in her medications   Elevated d dimer:  Venous duplex of the lower extremities are negative.  NM perfusion scan was discontinued as it was not available. Suspicion is low but in view of her elevated D-dimer CT chest with contrast ordered and is negative for PE.   Hypertension:  BP parameters are optimal.    Back pain s/p laminectomy  Pain control.  Further management as per primary   Mild normocytic anemia;  Hemoglobin stable around 11.   GERD  Stable at protonix .   Patient refused lovneox for DVT prophylaxis.,  changed to xarelto.  Recommended to ambulate.    Estimated body mass index is 40.6 kg/m as calculated from the following:   Height as of this encounter: 5' 2 (1.575 m).   Weight as of this  encounter: 100.7 kg.  Code Status: full code.  DVT Prophylaxis:  rivaroxaban (XARELTO) tablet 10 mg Start: 07/25/24 1230 SCD's Start: 06/29/24 1541 rivaroxaban (XARELTO) tablet 10 mg     Antimicrobials:   Anti-infectives (From admission, onward)    Start     Dose/Rate Route Frequency Ordered Stop   06/30/24 0600  ceFAZolin  (ANCEF ) IVPB 2g/100 mL premix  Status:  Discontinued        2 g 200 mL/hr over 30 Minutes Intravenous On call to O.R. 06/29/24 1539 06/29/24 1832   06/29/24 2200  doxycycline  (VIBRA -TABS) tablet 100 mg        100 mg Oral 2 times daily 06/29/24 1412     06/29/24 1900  ceFAZolin  (ANCEF ) IVPB 2g/100 mL premix        2 g 200 mL/hr over 30 Minutes Intravenous Every 8 hours 06/29/24 1540 06/30/24 0341   06/29/24 1215  vancomycin  (VANCOCIN ) powder  Status:  Discontinued          As needed 06/29/24 1215 06/29/24 1336   06/29/24 1214  gentamicin  (GARAMYCIN ) injection  Status:  Discontinued  As needed 06/29/24 1214 06/29/24 1336   06/29/24 1035  ceFAZolin  (ANCEF ) 2-4 GM/100ML-% IVPB       Note to Pharmacy: Virgil Ee: cabinet override      06/29/24 1035 06/29/24 2244        Medications  Scheduled Meds:  ascorbic acid   500 mg Oral Daily   aspirin  EC  81 mg Oral Daily   atenolol   50 mg Oral Daily   celecoxib   200 mg Oral BID   chlorzoxazone   500 mg Oral TID   cholecalciferol    Oral Daily   cyanocobalamin   500 mcg Oral Daily   cyclobenzaprine   10 mg Oral TID   doxycycline   100 mg Oral BID   furosemide   40 mg Oral Daily   gabapentin   1,200 mg Oral TID   influenza vac split trivalent PF  0.5 mL Intramuscular Tomorrow-1000   lubiprostone   48 mcg Oral Q lunch   multivitamin with minerals  1 tablet Oral q AM   oxyCODONE   15 mg Oral Q12H   pantoprazole   40 mg Oral Daily   potassium chloride   20 mEq Oral Daily   rivaroxaban  10 mg Oral Daily   senna  1 tablet Oral BID   sodium chloride  flush  3 mL Intravenous Q12H   Continuous Infusions:   PRN  Meds:.acetaminophen  **OR** acetaminophen , albuterol , bisacodyl , diazepam , diphenhydrAMINE , magnesium  citrate, menthol  **OR** phenol, morphine  injection, naloxone , ondansetron  **OR** ondansetron  (ZOFRAN ) IV, mouth rinse, oxyCODONE , oxyCODONE , senna-docusate, sodium chloride  flush    Subjective:   [name] was seen and examined today . No new complaints.   Objective:   Vitals:   07/26/24 0429 07/26/24 0727 07/26/24 1149 07/26/24 1601  BP: (!) 115/59 118/61 111/69 (!) 108/55  Pulse: 74 68 73 74  Resp: 18 16 16 16   Temp:  98.5 F (36.9 C) 98.7 F (37.1 C) 98.4 F (36.9 C)  TempSrc:  Oral Oral Oral  SpO2: 97% 100% 100% 99%  Weight:      Height:        Intake/Output Summary (Last 24 hours) at 07/26/2024 1656 Last data filed at 07/26/2024 1445 Gross per 24 hour  Intake --  Output 1800 ml  Net -1800 ml   Filed Weights   07/24/24 1538  Weight: 100.7 kg     Exam General exam: Appears calm and comfortable  Respiratory system: Clear to auscultation. Respiratory effort normal. Cardiovascular system: S1 & S2 heard, RRR.  Gastrointestinal system: Abdomen is soft bs+ Central nervous system: Alert and oriented.  Extremities: Symmetric 5 x 5 power. Skin: No rashes, Psychiatry:  Mood & affect appropriate.       Data Reviewed:  I have personally reviewed following labs and imaging studies   CBC Lab Results  Component Value Date   WBC 10.8 (H) 07/26/2024   RBC 4.34 07/26/2024   HGB 10.6 (L) 07/26/2024   HCT 35.5 (L) 07/26/2024   MCV 81.8 07/26/2024   MCH 24.4 (L) 07/26/2024   PLT 392 07/26/2024   MCHC 29.9 (L) 07/26/2024   RDW 15.4 07/26/2024   LYMPHSABS 3.0 07/26/2024   MONOABS 0.7 07/26/2024   EOSABS 0.2 07/26/2024   BASOSABS 0.1 07/26/2024     Last metabolic panel Lab Results  Component Value Date   NA 141 07/26/2024   K 3.6 07/26/2024   CL 101 07/26/2024   CO2 27 07/26/2024   BUN 22 07/26/2024   CREATININE 0.79 07/26/2024   GLUCOSE 95 07/26/2024    GFRNONAA >60 07/26/2024  GFRAA 90 03/22/2020   CALCIUM  8.8 (L) 07/26/2024   PHOS 3.7 11/01/2023   PROT 6.3 (L) 07/21/2024   ALBUMIN  3.2 (L) 07/21/2024   BILITOT 0.4 07/21/2024   ALKPHOS 127 (H) 07/21/2024   AST 18 07/21/2024   ALT 13 07/21/2024   ANIONGAP 13 07/26/2024    CBG (last 3)  No results for input(s): GLUCAP in the last 72 hours.    Coagulation Profile: No results for input(s): INR, PROTIME in the last 168 hours.   Radiology Studies: CT Angio Chest Pulmonary Embolism (PE) W or WO Contrast Result Date: 07/25/2024 EXAM: CTA CHEST 07/25/2024 03:27:14 AM TECHNIQUE: CTA of the chest was performed without and with the administration of 75 mL of intravenous iohexol (OMNIPAQUE) 350 MG/ML injection. Multiplanar reformatted images are provided for review. MIP images are provided for review. Automated exposure control, iterative reconstruction, and/or weight based adjustment of the mA/kV was utilized to reduce the radiation dose to as low as reasonably achievable. COMPARISON: Portable chest 07/22/2024, portable chest 01/13/2024, and portable chest 11/26/2023. Limited comparison is also available with a thoracic spine CT 03/11/2023. CLINICAL HISTORY: evaluate for PE. FINDINGS: PULMONARY ARTERIES: Pulmonary arteries are adequately opacified for evaluation. No acute pulmonary embolus. Main pulmonary artery is normal in caliber. MEDIASTINUM: The heart is slightly enlarged with a left chamber predominance and no venous dilatation. There are minimal atherosclerotic changes of the thoracic aorta without aneurysm, dissection or stenosis. The great vessels are clear. No coronary calcifications seen. LYMPH NODES: No mediastinal, hilar or axillary lymphadenopathy. LUNGS AND PLEURA: There is a small layering left pleural effusion with overlying compressive atelectasis or consolidation in the left lower lobe. There is no right pleural effusion. There are mild posterior atelectatic changes again in  both lungs. There is a new pleural-based 7 mm noncalcified nodule in the superior segment of the right lower lobe on axial 41 of series 7. There are occasional linear scarlike opacities in the bases. No further nodules. The remaining visualized lung parenchyma is clear. No pneumothorax. UPPER ABDOMEN: In the abdomen, the liver is mildly steatotic. There is a hyperenhancing lesion in the liver dome in segment 4 on axial 90 series 5, this is probably a flash-filling hemangioma. MRI without contrast is recommended to confirm a benign process. No other significant upper abdominal findings. SOFT TISSUES AND BONES: There is partially visible multilevel anterior plate fusion hardware in the lower cervical spine ending at C7. There is partially seen multilevel lower thoracic to lumbar dorsal fusion hardware beginning at T10 with advanced adjacent segment disease at T9-T10 including severe acquired foraminal stenosis and increasing vacuum disc phenomenon. There is additional severe adjacent segment disease at C7-T1. No acute soft tissue abnormality. IMPRESSION: 1. No evidence of pulmonary embolism. 2. Small layering left pleural effusion with overlying compressive atelectasis or consolidation in the left lower lobe. No right pleural effusion. 3. New 7 mm pleural-based noncalcified nodule in the superior segment of the right lower lobe. Follow-up chest CT recommended in 6 to 12 months, then again at 18 to 24 months from today in a high risk patient. In low-risk patients, further follow-up CT after initial follow-up revealing stability is optional. 4. Hyperenhancing lesion in hepatic segment 4, probably a flash-filling hemangioma; MRI without contrast is recommended to confirm a benign process. 5. Cervical and lower thoracic/lumbar fusion hardware with severe adjacent segment disease at c7-T1 and T9-T10. Electronically signed by: Francis Quam MD 07/25/2024 04:44 AM EDT RP Workstation: [id number]        [id number]  Cherlyn  M.D. [facility] 07/26/2024, 4:56 PM  Available via Epic secure chat 7am-7pm After 7 pm, please refer to night coverage provider listed on amion.

## 2024-07-26 NOTE — Plan of Care (Signed)

## 2024-07-27 DIAGNOSIS — I10 Essential (primary) hypertension: Secondary | ICD-10-CM | POA: Diagnosis not present

## 2024-07-27 NOTE — Progress Notes (Addendum)
 Physical Therapy Treatment Patient Details Name: [name] MRN: [id number] DOB: 10-04-1961 Today's Date: 07/27/2024   History of Present Illness Pt is a 63 y/o F admitted for thoracic stenosis at T9/10 and LE weakness, now s/p thoracic decompression with neurosurgery on 06/29/24. Of note, pt had T9-10 laminectomy on 01/08/24 and was admitted to [facility] for rehab. PMHx: GERD, anxiety, HTN, fibromyalgia, hyperthyroidism, IBS, ACDF 3/4, posterior lateral arthrodesis 6/16, CSF leak 6/24.    PT Comments  Pt agreeable to participate in physical therapy session. Session focused on bed mobility and transfer training. Pt requiring moderate assist for bed mobility and +2 for transfers to standing using RW. Performed 2 trials of standing to RW, but pt unable to weight shift to pivot or take steps. Subsequently utilized Stedy to transition over from bed to chair. Patient will benefit from continued inpatient follow up therapy, <3 hours/day.    If plan is discharge home, recommend the following: A lot of help with walking and/or transfers;A lot of help with bathing/dressing/bathroom;Assist for transportation;Help with stairs or ramp for entrance   Can travel by private vehicle     No  Equipment Recommendations  Hospital bed;Hoyer lift    Recommendations for Other Services       Precautions / Restrictions Precautions Precautions: Back;Fall Precaution Booklet Issued: No Recall of Precautions/Restrictions: Intact Precaution/Restrictions Comments: R AFO Restrictions Weight Bearing Restrictions Per Provider Order: No     Mobility  Bed Mobility Overal bed mobility: Needs Assistance Bed Mobility: Rolling, Sidelying to Sit Rolling: Mod assist Sidelying to sit: Mod assist       General bed mobility comments: Pt initiating well, modA to facilitate roll into sidelying using bed pad, assist for BLE management off edge of bed, pt pushing off of rails to sit up    Transfers Overall transfer level:  Needs assistance Equipment used: Rolling walker (2 wheels), Ambulation equipment used Transfers: Sit to/from Stand Sit to Stand: Mod assist, +2 physical assistance, Max assist           General transfer comment: Pt requiring modA + 2 to power up to stand from edge of bed with use of bed pad as sling underneath hips to power up, increased time to achieve upright, pt pushing up with L hand from the bed. MaxA + 2 to stand on 2nd trial, subsequently used Stedy to transfer over to chair due to decreased ability to pivot Transfer via Lift Equipment: Stedy  Ambulation/Gait               General Gait Details: unable   Stairs             Wheelchair Mobility     Tilt Bed    Modified Rankin (Stroke Patients Only)       Balance Overall balance assessment: Needs assistance Sitting-balance support: Feet supported Sitting balance-[name]: Fair Sitting balance - Comments: Needs cueing for head/hip relationship   Standing balance support: Bilateral upper extremity supported Standing balance-[name]: Poor                              Communication Communication Communication: No apparent difficulties  Cognition Arousal: Alert Behavior During Therapy: WFL for tasks assessed/performed   PT - Cognitive impairments: No apparent impairments                         Following commands: Intact  Cueing Cueing Techniques: Verbal cues, Tactile cues, Visual cues  Exercises      General Comments        Pertinent Vitals/Pain Pain Assessment Pain Assessment: Faces Faces Pain Scale: Hurts little more Pain Location: back, BLE (tenderness), neck Pain Descriptors / Indicators: Discomfort, Aching, Tender Pain Intervention(s): Limited activity within patient's tolerance, Monitored during session    Home Living                          Prior Function            PT Goals (current goals can now be found in the care plan section)  Acute Rehab PT Goals Patient Stated Goal: to get more therapy Time For Goal Achievement: 08/10/24 Potential to Achieve Goals: Fair Progress towards PT goals: Not progressing toward goals - comment    Frequency    Min 2X/week      PT Plan      Co-evaluation              AM-PAC PT 6 Clicks Mobility   Outcome Measure  Help needed turning from your back to your side while in a flat bed without using bedrails?: A Lot Help needed moving from lying on your back to sitting on the side of a flat bed without using bedrails?: A Lot Help needed moving to and from a bed to a chair (including a wheelchair)?: Total Help needed standing up from a chair using your arms (e.g., wheelchair or bedside chair)?: Total Help needed to walk in hospital room?: Total Help needed climbing 3-5 steps with a railing? : Total 6 Click Score: 8    End of Session Equipment Utilized During Treatment: Gait belt Activity Tolerance: Patient tolerated treatment well Patient left: in chair;with call bell/phone within reach Nurse Communication: Mobility status;Need for lift equipment PT Visit Diagnosis: Unsteadiness on feet (R26.81);Other abnormalities of gait and mobility (R26.89);Muscle weakness (generalized) (M62.81)     Time: 8861-8781 PT Time Calculation (min) (ACUTE ONLY): 40 min  Charges:    $Therapeutic Activity: 38-52 mins PT General Charges $$ ACUTE PT VISIT: 1 Visit                     Aleck Daring, PT, DPT Acute Rehabilitation Services Office [phone]    Aleck ONEIDA Daring 07/27/2024, 12:54 PM

## 2024-07-27 NOTE — Plan of Care (Signed)
  Problem: Education: Goal: Knowledge of General Education information will improve Description: Including pain rating scale, medication(s)/side effects and non-pharmacologic comfort measures Outcome: Progressing   Problem: Clinical Measurements: Goal: Ability to maintain clinical measurements within normal limits will improve Outcome: Progressing   Problem: Nutrition: Goal: Adequate nutrition will be maintained Outcome: Progressing   Problem: Elimination: Goal: Will not experience complications related to bowel motility Outcome: Progressing Goal: Will not experience complications related to urinary retention Outcome: Progressing   Problem: Pain Managment: Goal: General experience of comfort will improve and/or be controlled Outcome: Progressing   Problem: Activity: Goal: Ability to avoid complications of mobility impairment will improve Outcome: Progressing   Problem: Clinical Measurements: Goal: Ability to maintain clinical measurements within normal limits will improve Outcome: Progressing

## 2024-07-27 NOTE — Progress Notes (Addendum)
 Occupational Therapy Treatment Patient Details Name: [name] MRN: [id number] DOB: 1961-07-07 Today's Date: 07/27/2024   History of present illness Pt is a 63 y/o F admitted for thoracic stenosis at T9/10 and LE weakness, now s/p thoracic decompression with neurosurgery on 06/29/24. Of note, pt had T9-10 laminectomy on 01/08/24 and was admitted to [facility] for rehab. PMHx: GERD, anxiety, HTN, fibromyalgia, hyperthyroidism, IBS, ACDF 3/4, posterior lateral arthrodesis 6/16, CSF leak 6/24.   OT comments  Pt greeted seated upright in chair, agreeable for OT visit. Pt needed max A +1 to stand from the chair, likely due to fatigue & lower surface height. Significantly increased time to achieve upright onto [facility]. Transported to sink to perform various grooming ADLs - pt able to tolerated standing with unilateral UE support during grooming for ~2.5 minutes before needing seated rest break. Limited by poor stability of R ankle and reduced upright tolerance. Overall, tolerated session well, returned to bed at end of visit. OT to continue to follow.      If plan is discharge home, recommend the following:  Two people to help with walking and/or transfers;Two people to help with bathing/dressing/bathroom;Assist for transportation;Help with stairs or ramp for entrance;Assistance with cooking/housework   Equipment Recommendations  Other (comment) (defer)    Recommendations for Other Services      Precautions / Restrictions Precautions Precautions: Back;Fall Recall of Precautions/Restrictions: Intact Precaution/Restrictions Comments: R AFO Restrictions Weight Bearing Restrictions Per Provider Order: No       Mobility Bed Mobility Overal bed mobility: Needs Assistance Bed Mobility: Sit to Supine       Sit to supine: Mod assist, Used rails   General bed mobility comments: cues for reverse log roll technique, assist for BLE mgmt    Transfers Overall transfer level: Needs  assistance Equipment used: Ambulation equipment used Transfers: Sit to/from Stand Sit to Stand: Max assist           General transfer comment: inc A to stand from chair 2/2 lower surface height, significantly inc time to achieve upright, pulling on bars of Stedy to come to fully standing. Transfer via Lift Equipment: Stedy   Balance Overall balance assessment: Needs assistance         Standing balance support: Bilateral upper extremity supported, Reliant on assistive device for balance, During functional activity Standing balance-[name]: Poor Standing balance comment: heavily reliant on Stedy for standing posture, although able to remove one UE from [place] bar to complete oral care standing at the sink, leaning waist on crossbar of Stedy for additional support                           ADL either performed or assessed with clinical judgement   ADL Overall ADL's : Needs assistance/impaired                 Upper Body Dressing : Minimal assistance;Cueing for sequencing;Sitting Upper Body Dressing Details (indicate cue type and reason): doffing gown, assist to reach behind back Lower Body Dressing: Total assistance;Sitting/lateral leans Lower Body Dressing Details (indicate cue type and reason): doffed B sneakers             Functional mobility during ADLs: Supervision/safety;Contact guard assist      Extremity/Trunk Assessment              Vision       Perception     Praxis     Communication Communication Communication: No apparent  difficulties   Cognition Arousal: Alert Behavior During Therapy: WFL for tasks assessed/performed Cognition: No apparent impairments             OT - Cognition Comments: very talkative                 Following commands: Intact        Cueing   Cueing Techniques: Verbal cues, Tactile cues, Visual cues  Exercises      Shoulder Instructions       General Comments      Pertinent  Vitals/ Pain       Pain Assessment Pain Assessment: Faces Faces Pain Scale: Hurts little more Pain Location: R ankle, back Pain Descriptors / Indicators: Discomfort, Aching, Tender Pain Intervention(s): Limited activity within patient's tolerance, Monitored during session, Repositioned  Home Living                                          Prior Functioning/Environment              Frequency  Min 2X/week        Progress Toward Goals  OT Goals(current goals can now be found in the care plan section)  Progress towards OT goals: Progressing toward goals (slowly)  Acute Rehab OT Goals Time For Goal Achievement: 08/10/24 ADL Goals Pt Will Perform Grooming: with contact guard assist;standing (with assist of lift equipment PRN) Pt Will Perform Upper Body Dressing: with modified independence;sitting Pt Will Perform Lower Body Dressing: with contact guard assist;with adaptive equipment;sitting/lateral leans Pt Will Transfer to Toilet: with mod assist;with +2 assist;squat pivot transfer;stand pivot transfer;bedside commode  Plan      Co-evaluation                 AM-PAC OT 6 Clicks Daily Activity     Outcome Measure   Help from another person eating meals?: None Help from another person taking care of personal grooming?: A Little Help from another person toileting, which includes using toliet, bedpan, or urinal?: A Lot Help from another person bathing (including washing, rinsing, drying)?: A Lot Help from another person to put on and taking off regular upper body clothing?: A Little Help from another person to put on and taking off regular lower body clothing?: Total 6 Click Score: 15    End of Session Equipment Utilized During Treatment: Gait belt  OT Visit Diagnosis: Unsteadiness on feet (R26.81);Muscle weakness (generalized) (M62.81);Pain Pain - part of body:  (back)   Activity Tolerance Patient tolerated treatment well   Patient Left in  bed;with call bell/phone within reach;with bed alarm set   Nurse Communication          Time: 1352-1430 OT Time Calculation (min): 38 min  Charges: OT General Charges $OT Visit: 1 Visit OT Treatments $Self Care/Home Management : 38-52 mins  Anwitha Mapes D., MSOT, OTR/L Acute Rehabilitation Services [phone] Secure Chat Preferred  Rikki Milch 07/27/2024, 4:25 PM

## 2024-07-27 NOTE — Progress Notes (Signed)
 [facility]                                                                               [name], is a 63 y.o. female, DOB - [birth year], FMW:[id number] Admit date - 06/29/2024    Outpatient Primary MD for the patient is Leonel Cole, MD  LOS - 26  days    Brief summary    [name] is a 63 y.o. female with past medical history of essential tremor, GERD, irritable bowel syndrome admitted for laminectomy on neurosurgery service requested to consult for lower extremity edema and medical management.   Assessment & Plan    Assessment and Plan:  Bilateral lower extremity edema Possibly from diastolic CHF.  BNP elevated at 239, Echocardiogram showed LVEF of 70 to 75%, no regional wall motion abn. LV diastolic parameters consistent with grade 1 diastolic dysfunction.  Cxr showing Low volume film with vascular congestion and possible interstitial edema. Lower extremity duplex is negative DVT.  Stop the hydrochlorothiazide  and start the patient on lasix  40 mg daily.  Creatinine and electrolytes have been stable. Repeat labs today showed creatinine stable and diuresing well.   Patient reports lower extremity edema is slowly improving.  No changes in her medications No new complaints.    Elevated d dimer:  Venous duplex of the lower extremities are negative.  NM perfusion scan was discontinued as it was not available. Suspicion is low but in view of her elevated D-dimer CT chest with contrast ordered and is negative for PE.   Hypertension:  BP parameters are well controlled.    Back pain s/p laminectomy  Pain control.  Further management as per primary   Mild normocytic anemia;  Hemoglobin stable around 11.   GERD  Stable at protonix .   Patient refused lovneox for DVT prophylaxis.,  changed to xarelto.  Recommended to ambulate.    Will sign off  Please call us  if needed.    Estimated body mass index is 40.6 kg/m as calculated from the  following:   Height as of this encounter: 5' 2 (1.575 m).   Weight as of this encounter: 100.7 kg.  Code Status: full code.  DVT Prophylaxis:  rivaroxaban (XARELTO) tablet 10 mg Start: 07/25/24 1230 SCD's Start: 06/29/24 1541 rivaroxaban (XARELTO) tablet 10 mg     Antimicrobials:   Anti-infectives (From admission, onward)    Start     Dose/Rate Route Frequency Ordered Stop   06/30/24 0600  ceFAZolin  (ANCEF ) IVPB 2g/100 mL premix  Status:  Discontinued        2 g 200 mL/hr over 30 Minutes Intravenous On call to O.R. 06/29/24 1539 06/29/24 1832   06/29/24 2200  doxycycline  (VIBRA -TABS) tablet 100 mg        100 mg Oral 2 times daily 06/29/24 1412     06/29/24 1900  ceFAZolin  (ANCEF ) IVPB 2g/100 mL premix        2 g 200 mL/hr over 30 Minutes Intravenous Every 8 hours 06/29/24 1540 06/30/24 0341   06/29/24 1215  vancomycin  (VANCOCIN ) powder  Status:  Discontinued          As needed 06/29/24 1215 06/29/24 1336  06/29/24 1214  gentamicin  (GARAMYCIN ) injection  Status:  Discontinued          As needed 06/29/24 1214 06/29/24 1336   06/29/24 1035  ceFAZolin  (ANCEF ) 2-4 GM/100ML-% IVPB       Note to Pharmacy: Virgil Ee: cabinet override      06/29/24 1035 06/29/24 2244        Medications  Scheduled Meds:  ascorbic acid   500 mg Oral Daily   aspirin  EC  81 mg Oral Daily   atenolol   50 mg Oral Daily   celecoxib   200 mg Oral BID   chlorzoxazone   500 mg Oral TID   cholecalciferol    Oral Daily   cyanocobalamin   500 mcg Oral Daily   cyclobenzaprine   10 mg Oral TID   doxycycline   100 mg Oral BID   furosemide   40 mg Oral Daily   gabapentin   1,200 mg Oral TID   influenza vac split trivalent PF  0.5 mL Intramuscular Tomorrow-1000   lubiprostone   48 mcg Oral Q lunch   multivitamin with minerals  1 tablet Oral q AM   oxyCODONE   15 mg Oral Q12H   pantoprazole   40 mg Oral Daily   potassium chloride   20 mEq Oral Daily   rivaroxaban  10 mg Oral Daily   senna  1 tablet Oral BID    sodium chloride  flush  3 mL Intravenous Q12H   Continuous Infusions:   PRN Meds:.acetaminophen  **OR** acetaminophen , albuterol , bisacodyl , diazepam , diphenhydrAMINE , magnesium  citrate, menthol  **OR** phenol, morphine  injection, naloxone , ondansetron  **OR** ondansetron  (ZOFRAN ) IV, mouth rinse, oxyCODONE , oxyCODONE , senna-docusate, sodium chloride  flush    Subjective:   [name] was seen and examined today  no complaints.   Objective:   Vitals:   07/27/24 0818 07/27/24 0819 07/27/24 1100 07/27/24 1549  BP: 95/63 (!) 142/71 129/72 120/62  Pulse: 65  70 69  Resp: 18  14 14   Temp: 97.6 F (36.4 C)  98.3 F (36.8 C) 97.9 F (36.6 C)  TempSrc: Oral  Oral Oral  SpO2: 96%  100% 99%  Weight:      Height:        Intake/Output Summary (Last 24 hours) at 07/27/2024 1743 Last data filed at 07/27/2024 1500 Gross per 24 hour  Intake 240 ml  Output 1600 ml  Net -1360 ml   Filed Weights   07/24/24 1538  Weight: 100.7 kg    General exam: Appears calm and comfortable  Respiratory system: Clear to auscultation. Respiratory effort normal. Cardiovascular system: S1 & S2 heard, RRR. No JVD, Gastrointestinal system: Abdomen is nondistended, soft and nontender.  Central nervous system: Alert and oriented. No focal neurological deficits. Extremities: Symmetric 5 x 5 power. Skin: No rashes,  Psychiatry:  Mood & affect appropriate.        Data Reviewed:  I have personally reviewed following labs and imaging studies   CBC Lab Results  Component Value Date   WBC 10.8 (H) 07/26/2024   RBC 4.34 07/26/2024   HGB 10.6 (L) 07/26/2024   HCT 35.5 (L) 07/26/2024   MCV 81.8 07/26/2024   MCH 24.4 (L) 07/26/2024   PLT 392 07/26/2024   MCHC 29.9 (L) 07/26/2024   RDW 15.4 07/26/2024   LYMPHSABS 3.0 07/26/2024   MONOABS 0.7 07/26/2024   EOSABS 0.2 07/26/2024   BASOSABS 0.1 07/26/2024     Last metabolic panel Lab Results  Component Value Date   NA 141 07/26/2024   K 3.6  07/26/2024   CL 101 07/26/2024  CO2 27 07/26/2024   BUN 22 07/26/2024   CREATININE 0.79 07/26/2024   GLUCOSE 95 07/26/2024   GFRNONAA >60 07/26/2024   GFRAA 90 03/22/2020   CALCIUM  8.8 (L) 07/26/2024   PHOS 3.7 11/01/2023   PROT 6.3 (L) 07/21/2024   ALBUMIN  3.2 (L) 07/21/2024   BILITOT 0.4 07/21/2024   ALKPHOS 127 (H) 07/21/2024   AST 18 07/21/2024   ALT 13 07/21/2024   ANIONGAP 13 07/26/2024    CBG (last 3)  No results for input(s): GLUCAP in the last 72 hours.    Coagulation Profile: No results for input(s): INR, PROTIME in the last 168 hours.   Radiology Studies: No results found.       Elgie Butter M.D. [facility] 07/27/2024, 5:43 PM  Available via Epic secure chat 7am-7pm After 7 pm, please refer to night coverage provider listed on amion.

## 2024-07-27 NOTE — Progress Notes (Signed)
 Patient ID: [name], female   DOB: 1961-06-24, 63 y.o.   MRN: [id number] BP (!) 118/56 (BP Location: Left Arm)   Pulse 74   Temp (!) 97.4 F (36.3 C) (Oral)   Resp 14   Ht 5' 2 (1.575 m)   Wt 100.7 kg   LMP 11/20/2012   SpO2 98%   BMI 40.60 kg/m  Alert and orientedx 4 Speech is clear and fluent Moving lower extremities, up with PT Still with oedema but improving Continue with PT, OT

## 2024-07-28 MED ORDER — DIPHENHYDRAMINE HCL 25 MG PO CAPS
50.0000 mg | ORAL_CAPSULE | Freq: Four times a day (QID) | ORAL | Status: DC | PRN
  Administered 2024-07-28 – 2024-09-05 (×95): 50 mg via ORAL
  Filled 2024-07-28 (×96): qty 2

## 2024-07-28 MED ORDER — SENNA 8.6 MG PO TABS
2.0000 | ORAL_TABLET | Freq: Every day | ORAL | Status: DC
  Administered 2024-07-28 – 2024-09-05 (×40): 17.2 mg via ORAL
  Filled 2024-07-28 (×41): qty 2

## 2024-07-28 NOTE — Progress Notes (Signed)
 Patient ID: [name], female   DOB: Sep 07, 1961, 63 y.o.   MRN: [id number] BP (!) 129/52 (BP Location: Right Arm)   Pulse 77   Temp 98.2 F (36.8 C) (Oral)   Resp 18   Ht 5' 2 (1.575 m)   Wt 100.7 kg   LMP 11/20/2012   SpO2 97%   BMI 40.60 kg/m  Alert and oriented x 4, speech is clear and fluent Moving all extremities Continue with PT. Placement is not secured.

## 2024-07-28 NOTE — Plan of Care (Signed)
 Sat up in the chair for several hours today.  In good spirits.  Has chronic pain but said her current pain regimen has helped manage her pain.   Problem: Education: Goal: Knowledge of General Education information will improve Description: Including pain rating scale, medication(s)/side effects and non-pharmacologic comfort measures Outcome: Progressing   Problem: Health Behavior/Discharge Planning: Goal: Ability to manage health-related needs will improve Outcome: Progressing   Problem: Clinical Measurements: Goal: Diagnostic test results will improve Outcome: Progressing   Problem: Activity: Goal: Risk for activity intolerance will decrease Outcome: Progressing   Problem: Nutrition: Goal: Adequate nutrition will be maintained Outcome: Progressing   Problem: Coping: Goal: Level of anxiety will decrease Outcome: Progressing   Problem: Pain Managment: Goal: General experience of comfort will improve and/or be controlled Outcome: Progressing   Problem: Safety: Goal: Ability to remain free from injury will improve Outcome: Progressing

## 2024-07-28 NOTE — Progress Notes (Signed)
 Physical Therapy Treatment Patient Details Name: [name] MRN: [id number] DOB: 08-12-1961 Today's Date: 07/28/2024   History of Present Illness Pt is a 63 y/o F admitted for thoracic stenosis at T9/10 and LE weakness, now s/p thoracic decompression with neurosurgery on 06/29/24. Of note, pt had T9-10 laminectomy on 01/08/24 and was admitted to [facility] for rehab. PMHx: GERD, anxiety, HTN, fibromyalgia, hyperthyroidism, IBS, ACDF 3/4, posterior lateral arthrodesis 6/16, CSF leak 6/24.    PT Comments  Pt received supine in bed and agreeable to PT. [name] for transfer from [employer] and exercise today. Pt able to complete x5 STS with min assist to power up using the Stedy. When performing sit>stand from bed height, pt requires +2 max assist to power up. Pt also continues to require max assist to move LE during bed mobility and to position feet when performing transfers. During mobility, she stated her back pain was 8/10, which she stated was normally 10/10. Recommend continued inpatient follow up therapy, <3 hours/day. Acute PT to follow.    If plan is discharge home, recommend the following: A lot of help with walking and/or transfers;A lot of help with bathing/dressing/bathroom;Assist for transportation;Help with stairs or ramp for entrance   Can travel by private vehicle     No  Equipment Recommendations  Hospital bed;Hoyer lift    Recommendations for Other Services       Precautions / Restrictions Precautions Precautions: Back;Fall Precaution Booklet Issued: No Recall of Precautions/Restrictions: Intact Precaution/Restrictions Comments: R AFO Restrictions Weight Bearing Restrictions Per Provider Order: No     Mobility  Bed Mobility Overal bed mobility: Needs Assistance Bed Mobility: Supine to Sit     Supine to sit: Mod assist     General bed mobility comments: Able to assist some with UE pulling on rail to roll. Required assist for positioning of LE.     Transfers Overall transfer level: Needs assistance Equipment used: Ambulation equipment used Transfers: Sit to/from Stand Sit to Stand: +2 physical assistance, Max assist           General transfer comment: Applied compression sock to R and normal personal sock to L, R AFO, and tennis shoes in bed prior to mobility. Pt required assist for foot positioning on the Stedy and max A +2 to perform STS from bed. Pt utilized bars to assist in pulling self to stand. Transfer via Lift Equipment: Stedy  Ambulation/Gait                   Stairs             Wheelchair Mobility     Tilt Bed    Modified Rankin (Stroke Patients Only)       Balance Overall balance assessment: Needs assistance Sitting-balance support: Single extremity supported, Feet unsupported Sitting balance-[name]: Fair Sitting balance - Comments: Stays upright in sitting usually with at least one UE holding onto a bed rail. Tends to have a posterior lean the longer she stays in sitting and requires min assist to sit upright again. Postural control: Posterior lean Standing balance support: Bilateral upper extremity supported, Reliant on assistive device for balance Standing balance-[name]: Poor Standing balance comment: Heavily reliant on Stedy to maintain balance and upright standing posture.                            Communication Communication Communication: No apparent difficulties  Cognition Arousal: Alert Behavior During Therapy: [facility] for  tasks assessed/performed   PT - Cognitive impairments: No apparent impairments                         Following commands: Intact      Cueing Cueing Techniques: Verbal cues, Gestural cues, Tactile cues  Exercises Other Exercises Other Exercises: STS using Stedy x5 reps with min to mod assist to power up to stand.    General Comments General comments (skin integrity, edema, etc.): Pt complained of R hand numbness with  inc reps of STS, which she attributed to her neuropathy.      Pertinent Vitals/Pain Pain Assessment Pain Assessment: 0-10 Pain Score: 8  Pain Location: Back Pain Descriptors / Indicators: Discomfort, Tender Pain Intervention(s): Monitored during session    Home Living                          Prior Function            PT Goals (current goals can now be found in the care plan section) Acute Rehab PT Goals PT Goal Formulation: With patient Time For Goal Achievement: 08/10/24 Potential to Achieve Goals: Fair Progress towards PT goals: Progressing toward goals    Frequency    Min 2X/week      PT Plan      Co-evaluation     PT goals addressed during session: Mobility/safety with mobility;Strengthening/ROM;Proper use of DME        AM-PAC PT 6 Clicks Mobility   Outcome Measure  Help needed turning from your back to your side while in a flat bed without using bedrails?: A Lot Help needed moving from lying on your back to sitting on the side of a flat bed without using bedrails?: A Lot Help needed moving to and from a bed to a chair (including a wheelchair)?: Total Help needed standing up from a chair using your arms (e.g., wheelchair or bedside chair)?: Total Help needed to walk in hospital room?: Total Help needed climbing 3-5 steps with a railing? : Total 6 Click Score: 8    End of Session Equipment Utilized During Treatment: Gait belt Activity Tolerance: Patient tolerated treatment well Patient left: in chair;with call bell/phone within reach   PT Visit Diagnosis: Unsteadiness on feet (R26.81);Other abnormalities of gait and mobility (R26.89);Muscle weakness (generalized) (M62.81)     Time:  -1149    Charges:                            [place] Kourtney Montesinos, SPT    [place] Ashley Bultema 07/28/2024, 1:40 PM

## 2024-07-28 NOTE — Progress Notes (Signed)
 Patient assisted back to the bed by student nurses. X3 to assist with STEDY movement back to bed. Patient reported increased weakness from sitting in chair. Tolerated well. Wound vac/ pure wick in place. SCDS reapplied to patient.

## 2024-07-29 NOTE — Progress Notes (Signed)
 Patient ID: [name], female   DOB: 1960-10-15, 63 y.o.   MRN: [id number] BP (!) 116/53 (BP Location: Left Arm)   Pulse 73   Temp 98.6 F (37 C)   Resp 16   Ht 5' 2 (1.575 m)   Wt 100.7 kg   LMP 11/20/2012   SpO2 95%   BMI 40.60 kg/m  Spoke with Social work. Now that she is taking steps, may be able to get a second look at the [place] facility.  Moving all extremities Continue with pt and ot

## 2024-07-29 NOTE — Progress Notes (Signed)
 Occupational Therapy Treatment Patient Details Name: [name] MRN: [id number] DOB: 1960/10/15 Today's Date: 07/29/2024   History of present illness Pt is a 63 y/o F admitted for thoracic stenosis at T9/10 and LE weakness, now s/p thoracic decompression with neurosurgery on 06/29/24. Of note, pt had T9-10 laminectomy on 01/08/24 and was admitted to [facility] for rehab. PMHx: GERD, anxiety, HTN, fibromyalgia, hyperthyroidism, IBS, ACDF 3/4, posterior lateral arthrodesis 6/16, CSF leak 6/24.   OT comments  Pt received in supine, appeared down today, although participatory. Reports she is frustrated with her current situation and lack of progress. Focus of session today on preparatory bed mobility, rolling, and sitting/upright tolerance. Pt required min A to roll bilaterally several times, reliant on bed rails. Transferred to EOB with mod A and still requires at least one UE for support, anticipate 2/2 back pain/fatigue and body habitus. She continues to be limited by significant LE weakness and reduced upright tolerance. Extra time spent consoling pt.  Pt continues to be functioning below her baseline and would benefit from ongoing acute skilled OT services to facilitate safety and independence with ADLs and mobility upon d/c.       If plan is discharge home, recommend the following:  Two people to help with walking and/or transfers;Two people to help with bathing/dressing/bathroom;Assist for transportation;Help with stairs or ramp for entrance;Assistance with cooking/housework   Equipment Recommendations  Other (comment) (defer)    Recommendations for Other Services      Precautions / Restrictions Precautions Precautions: Back;Fall Precaution Booklet Issued: No Recall of Precautions/Restrictions: Intact Restrictions Weight Bearing Restrictions Per Provider Order: No       Mobility Bed Mobility Overal bed mobility: Needs Assistance Bed Mobility: Rolling, Supine to Sit, Sit to  Supine Rolling: Min assist, Contact guard assist, Used rails   Supine to sit: Mod assist, Used rails Sit to supine: Mod assist, HOB elevated, Used rails   General bed mobility comments: rolled bilaterally 2x in each direction with reliance on bed rails, assist to sustain contralateral knee flexion to optimize body mechanics and self-assist with rolling; cued for log roll technique    Transfers                         Balance Overall balance assessment: Needs assistance Sitting-balance support: Single extremity supported, Feet supported Sitting balance-[name]: Fair Sitting balance - Comments: intermittent CGA to sustain sitting EOB, heavily reliant on at least one UE for support, anticipated limited by back pain/fatigue and body habitus                                   ADL either performed or assessed with clinical judgement   ADL Overall ADL's : Needs assistance/impaired                     Lower Body Dressing: Total assistance;Bed level Lower Body Dressing Details (indicate cue type and reason): adjusted B socks     Toileting- Clothing Manipulation and Hygiene: Total assistance Toileting - Clothing Manipulation Details (indicate cue type and reason): purewick intact            Extremity/Trunk Assessment              Vision       Perception     Praxis     Communication Communication Communication: No apparent difficulties   Cognition Arousal: Alert Behavior  During Therapy: WFL for tasks assessed/performed Cognition: No apparent impairments             OT - Cognition Comments: very talkative                 Following commands: Intact        Cueing   Cueing Techniques: Verbal cues, Gestural cues, Tactile cues  Exercises General Exercises - Lower Extremity Hip Flexion/Marching: Both, 10 reps, Seated, AROM, AAROM Other Exercises Other Exercises: modified sit-ups 3x at EOB with focus on anterior/posterior  pelvic tilt in preparation for transfers/ADLs at EOB    Shoulder Instructions       General Comments wound vac intact; pt inc tearful today re: current situation and lack of progress    Pertinent Vitals/ Pain       Pain Assessment Pain Assessment: Faces Faces Pain Scale: Hurts little more Pain Location: back Pain Descriptors / Indicators: Discomfort Pain Intervention(s): Limited activity within patient's tolerance, Monitored during session, Repositioned  Home Living                                          Prior Functioning/Environment              Frequency  Min 2X/week        Progress Toward Goals  OT Goals(current goals can now be found in the care plan section)  Progress towards OT goals: Progressing toward goals (slowly)     Plan      Co-evaluation                 AM-PAC OT 6 Clicks Daily Activity     Outcome Measure   Help from another person eating meals?: None Help from another person taking care of personal grooming?: A Little Help from another person toileting, which includes using toliet, bedpan, or urinal?: A Lot Help from another person bathing (including washing, rinsing, drying)?: A Lot Help from another person to put on and taking off regular upper body clothing?: A Little Help from another person to put on and taking off regular lower body clothing?: Total 6 Click Score: 15    End of Session    OT Visit Diagnosis: Unsteadiness on feet (R26.81);Muscle weakness (generalized) (M62.81);Pain   Activity Tolerance Patient tolerated treatment well   Patient Left     Nurse Communication          Time: 8394-8349 OT Time Calculation (min): 45 min  Charges: OT General Charges $OT Visit: 1 Visit OT Treatments $Therapeutic Activity: 23-37 mins $Therapeutic Exercise: 8-22 mins  Lamonte Hartt D., MSOT, OTR/L Acute Rehabilitation Services [phone] Secure Chat Preferred  [name] 07/29/2024, 5:17  PM

## 2024-07-29 NOTE — Plan of Care (Signed)
  Problem: Clinical Measurements: Goal: Ability to maintain clinical measurements within normal limits will improve Outcome: Progressing Goal: Will remain free from infection Outcome: Progressing Goal: Diagnostic test results will improve Outcome: Progressing Goal: Respiratory complications will improve Outcome: Progressing Goal: Cardiovascular complication will be avoided Outcome: Progressing   Problem: Activity: Goal: Risk for activity intolerance will decrease Outcome: Progressing   Problem: Elimination: Goal: Will not experience complications related to bowel motility Outcome: Progressing Goal: Will not experience complications related to urinary retention Outcome: Progressing   Problem: Pain Managment: Goal: General experience of comfort will improve and/or be controlled Outcome: Progressing   Problem: Safety: Goal: Ability to remain free from injury will improve Outcome: Progressing   Problem: Skin Integrity: Goal: Risk for impaired skin integrity will decrease Outcome: Progressing   Problem: Activity: Goal: Risk for activity intolerance will decrease Outcome: Progressing

## 2024-07-30 NOTE — Plan of Care (Signed)

## 2024-07-30 NOTE — TOC Progression Note (Signed)
 Transition of Care [facility]) - Progression Note    Patient Details  Name: [name] MRN: [id number] Date of Birth: 1961-02-10  Transition of Care [facility]) CM/SW Contact  [name], [name] Phone Number: 07/30/2024, 12:40 PM  Clinical Narrative:   CSW spoke with MD late yesterday about disposition, MD has serious concerns about SNF ability to manage the patient's wounds; asking if AIR would be an option somewhere else, as the patient is making improvements and he believes that is the best disposition for her. CSW met with patient today, she is in agreement with looking back at [facility] again. CSW contacted [facility] to discuss, they are willing to attempt an insurance authorization again for AIR. CSW coordinated with PT, will send updated PT note to [facility] to start insurance authorization. CSW to follow.    Expected Discharge Plan: IP Rehab Facility Barriers to Discharge: Continued Medical Work up, Air traffic controller and Services   Discharge Planning Services: CM Consult Post Acute Care Choice: IP Rehab Living arrangements for the past 2 months: Single Family Home                                       Social Drivers of Health (SDOH) Interventions SDOH Screenings   Food Insecurity: No Food Insecurity (06/30/2024)  Housing: Low Risk  (06/30/2024)  Transportation Needs: No Transportation Needs (06/30/2024)  Utilities: Not At Risk (06/30/2024)  Depression (PHQ2-9): Low Risk  (01/12/2024)  Tobacco Use: Medium Risk (06/29/2024)    Readmission Risk Interventions    05/17/2022    3:02 PM  Readmission Risk Prevention Plan  Transportation Screening Complete  PCP or Specialist Appt within 5-7 Days Complete  Home Care Screening Complete  Medication Review (RN CM) Complete

## 2024-07-30 NOTE — Progress Notes (Signed)
 Patient ID: [name], female   DOB: 08-Dec-1960, 63 y.o.   MRN: [id number] BP (!) 122/56 (BP Location: Right Arm)   Pulse 69   Temp 98.7 F (37.1 C) (Oral)   Resp 16   Ht 5' 2 (1.575 m)   Wt 100.7 kg   LMP 11/20/2012   SpO2 96%   BMI 40.60 kg/m  Alert and oriented x 4, speech is clear and fluent. Moving all extremities [facility] will take another look.  No new problems Working with pt and ot

## 2024-07-30 NOTE — Plan of Care (Signed)
 C/O her pain being at a level of 10 most of the day but presents as calm and not stressed.  Said she has dealt with this severe pain for many years since it has been with her back and her Fibromyalgia but the pain meds that she is on keep it manageable.  Said she has a pain doctor that helps to manage her pain as well.  No signs of distress and no leakage or problems from her wound vac.    Problem: Activity: Goal: Ability to avoid complications of mobility impairment will improve Outcome: Progressing   Problem: Education: Goal: Knowledge of General Education information will improve Description: Including pain rating scale, medication(s)/side effects and non-pharmacologic comfort measures Outcome: Progressing   Problem: Clinical Measurements: Goal: Will remain free from infection Outcome: Progressing   Problem: Clinical Measurements: Goal: Diagnostic test results will improve Outcome: Progressing   Problem: Activity: Goal: Risk for activity intolerance will decrease Outcome: Progressing   Problem: Coping: Goal: Level of anxiety will decrease Outcome: Progressing   Problem: Pain Managment: Goal: General experience of comfort will improve and/or be controlled Outcome: Progressing

## 2024-07-30 NOTE — Progress Notes (Signed)
 Chaplain visited Pt to discuss advance directive. Pt asked if we could leave document so that she could review.   Chaplain honored her request, and left AD package with Pt. Chaplain informed Pt if she have any questions or concerns regard the AD do not hesitate to let the medical team know. Chaplain services remain available.

## 2024-07-30 NOTE — Progress Notes (Signed)
 Physical Therapy Treatment Patient Details Name: [name] MRN: [id number] DOB: 01-11-1961 Today's Date: 07/30/2024   History of Present Illness Pt is a 63 y/o F admitted for thoracic stenosis at T9/10 and LE weakness, now s/p thoracic decompression with neurosurgery on 06/29/24. Of note, pt had T9-10 laminectomy on 01/08/24 and was admitted to [facility] for rehab. PMHx: GERD, anxiety, HTN, fibromyalgia, hyperthyroidism, IBS, ACDF 3/4, posterior lateral arthrodesis 6/16, CSF leak 6/24.   PT Comments  Pt received in supine and making steady progress towards acute PT goals. Pt was highly motivated to participate with pt wanting to work on taking steps. Noted improvements in today's session by needing less assistance, ModAx2, to stand with use of RW. Worked on standing balance with pt able to maintain upright posture for ~2 minutes. Pt also improved by being able to take steps towards the recliner with cues to weight-shift and increased time. Recommending post-acute rehab with TOC following. Acute PT to follow to continue working towards goals.   If plan is discharge home, recommend the following: A lot of help with walking and/or transfers;A lot of help with bathing/dressing/bathroom;Assist for transportation;Help with stairs or ramp for entrance   Can travel by private vehicle     No  Equipment Recommendations  Hospital bed;Hoyer lift       Precautions / Restrictions Precautions Precautions: Back;Fall Precaution Booklet Issued: No Recall of Precautions/Restrictions: Intact Precaution/Restrictions Comments: R AFO, wound vac Restrictions Weight Bearing Restrictions Per Provider Order: No     Mobility  Bed Mobility Overal bed mobility: Needs Assistance Bed Mobility: Rolling, Sidelying to Sit Rolling: Min assist, Used rails Sidelying to sit: Mod assist, Used rails     General bed mobility comments: cues to reach for rail with MinA to complete roll. ModA to raise trunk with assist to keep  LE's off EOB    Transfers Overall transfer level: Needs assistance Equipment used: Rolling walker (2 wheels) Transfers: Sit to/from Stand, Bed to chair/wheelchair/BSC Sit to Stand: Mod assist, +2 physical assistance, +2 safety/equipment   Step pivot transfers: Mod assist, +2 physical assistance, +2 safety/equipment     General transfer comment: assist to position R foot in neutral with B feet blocked to prevent anterior slide. ModAx2 to stand with assist to boost-up and steady. Slight assist to weight-shift with pt then able to take steps towards recliner. Increased time and effort         Balance Overall balance assessment: Needs assistance Sitting-balance support: Single extremity supported, Feet supported Sitting balance-[name]: Fair     Standing balance support: Bilateral upper extremity supported, Reliant on assistive device for balance Standing balance-[name]: Poor Standing balance comment: reliant on UE and external support       Communication Communication Communication: No apparent difficulties  Cognition Arousal: Alert Behavior During Therapy: WFL for tasks assessed/performed   PT - Cognitive impairments: No apparent impairments  Following commands: Intact      Cueing Cueing Techniques: Verbal cues, Gestural cues, Tactile cues         Pertinent Vitals/Pain Pain Assessment Pain Assessment: Faces Faces Pain Scale: Hurts even more Pain Location: back Pain Descriptors / Indicators: Discomfort Pain Intervention(s): Monitored during session, Limited activity within patient's tolerance, Repositioned     PT Goals (current goals can now be found in the care plan section) Acute Rehab PT Goals PT Goal Formulation: With patient Time For Goal Achievement: 08/10/24 Potential to Achieve Goals: Fair Progress towards PT goals: Progressing toward goals    Frequency  Min 2X/week       AM-PAC PT 6 Clicks Mobility   Outcome Measure  Help needed  turning from your back to your side while in a flat bed without using bedrails?: A Lot Help needed moving from lying on your back to sitting on the side of a flat bed without using bedrails?: A Lot Help needed moving to and from a bed to a chair (including a wheelchair)?: Total Help needed standing up from a chair using your arms (e.g., wheelchair or bedside chair)?: Total Help needed to walk in hospital room?: Total Help needed climbing 3-5 steps with a railing? : Total 6 Click Score: 8    End of Session Equipment Utilized During Treatment: Gait belt Activity Tolerance: Patient tolerated treatment well Patient left: in chair;with call bell/phone within reach Nurse Communication: Mobility status;Need for lift equipment PT Visit Diagnosis: Unsteadiness on feet (R26.81);Other abnormalities of gait and mobility (R26.89);Muscle weakness (generalized) (M62.81)     Time: 8864-8791 PT Time Calculation (min) (ACUTE ONLY): 33 min  Charges:    $Therapeutic Activity: 23-37 mins PT General Charges $$ ACUTE PT VISIT: 1 Visit                    Kate ORN, PT, DPT Secure Chat Preferred  Rehab Office [phone]   Kate BRAVO Wendolyn 07/30/2024, 1:04 PM

## 2024-07-31 NOTE — Progress Notes (Signed)
 Patient ID: [name], female   DOB: 02/08/1961, 63 y.o.   MRN: [id number] BP (!) 112/50 (BP Location: Left Arm)   Pulse 77   Temp 98.2 F (36.8 C) (Oral)   Resp 16   Ht 5' 2 (1.575 m)   Wt 100.7 kg   LMP 11/20/2012   SpO2 97%   BMI 40.60 kg/m  Alert and oriented x 4, speech is cleAr No neurological changes Will change wound vac tomorrow Doing well

## 2024-07-31 NOTE — Plan of Care (Signed)

## 2024-08-01 NOTE — Plan of Care (Signed)
  Problem: Activity: Goal: Risk for activity intolerance will decrease Outcome: Progressing   Problem: Activity: Goal: Ability to avoid complications of mobility impairment will improve Outcome: Progressing   Problem: Pain Management: Goal: Pain level will decrease Outcome: Progressing

## 2024-08-01 NOTE — Plan of Care (Signed)
   Problem: Education: Goal: Knowledge of General Education information will improve Description Including pain rating scale, medication(s)/side effects and non-pharmacologic comfort measures Outcome: Progressing

## 2024-08-01 NOTE — Progress Notes (Signed)
 Subjective: NAEs o/n  Objective: Vital signs in last 24 hours: Temp:  [97.7 F (36.5 C)-98.3 F (36.8 C)] 97.7 F (36.5 C) (10/26 0417) Pulse Rate:  [60-75] 75 (10/26 0749) Resp:  [16-18] 16 (10/26 0749) BP: (100-126)/(48-68) 126/64 (10/26 0749) SpO2:  [94 %-100 %] 100 % (10/26 0749)  Intake/Output from previous day: 10/25 0701 - 10/26 0700 In: 480 [P.O.:480] Out: 2950 [Urine:2950] Intake/Output this shift: Total I/O In: 240 [P.O.:240] Out: -   NAD MAEs  Mild pitting edema in lower extremities  Lab Results: No results for input(s): WBC, HGB, HCT, PLT in the last 72 hours. BMET No results for input(s): NA, K, CL, CO2, GLUCOSE, BUN, CREATININE, CALCIUM  in the last 72 hours.  Studies/Results: No results found.  Assessment/Plan: S/p thoracic discectomy - continue supportive care - Dr. Gillie plans to change out wound vac   [name] 08/01/2024, 11:32 AM

## 2024-08-01 NOTE — Progress Notes (Signed)
 Pt had BM, used bedpan, turned w 1 assist. Pt calm, cooperative, on RA, environment safe, call bell within reach. Denies nausea, reports pain 10 of 10, see MAR for med admin.

## 2024-08-02 NOTE — TOC Progression Note (Signed)
 Transition of Care [facility]) - Progression Note    Patient Details  Name: [name] MRN: [id number] Date of Birth: [birth year]  Transition of Care [facility]) CM/SW Contact  Luise JAYSON Pan, [place] Phone Number: 08/02/2024, 9:49 AM  Clinical Narrative:   CMA called CSW with peer to peer information for MD. Peer to peer due by 12PM today, call [phone]; opt 5. CSW communicated information to [facility]. CSW unable to reach MD at this time. RNCM to reach attending and communicate peer to peer information.   CSW will continue to follow.    Expected Discharge Plan: IP Rehab Facility Barriers to Discharge: Continued Medical Work up, Air Traffic Controller and Services   Discharge Planning Services: CM Consult Post Acute Care Choice: IP Rehab Living arrangements for the past 2 months: Single Family Home                                       Social Drivers of Health (SDOH) Interventions SDOH Screenings   Food Insecurity: No Food Insecurity (07/30/2024)  Housing: Low Risk  (07/30/2024)  Transportation Needs: No Transportation Needs (07/30/2024)  Utilities: Not At Risk (07/30/2024)  Depression (PHQ2-9): Low Risk  (01/12/2024)  Tobacco Use: Medium Risk (06/29/2024)    Readmission Risk Interventions    05/17/2022    3:02 PM  Readmission Risk Prevention Plan  Transportation Screening Complete  PCP or Specialist Appt within 5-7 Days Complete  Home Care Screening Complete  Medication Review (RN CM) Complete

## 2024-08-02 NOTE — Plan of Care (Signed)
   Problem: Education: Goal: Knowledge of General Education information will improve Description Including pain rating scale, medication(s)/side effects and non-pharmacologic comfort measures Outcome: Progressing

## 2024-08-02 NOTE — TOC Progression Note (Addendum)
 Transition of Care [facility]) - Progression Note    Patient Details  Name: [name] MRN: [id number] Date of Birth: 1961-09-05  Transition of Care [facility]) CM/SW Contact  Andrez [name], RN Phone Number: 08/02/2024, 9:57 AM  Clinical Narrative:     CM called insurance and asked for extension until 4 pm for peer to peer. CM has left detailed VM with Dr Gillie secretary about peer to peer.  IP Care management following.  1613: pt was denied for [facility] IR per [facility]. [facility] is working on getting appeal information. IP Care management following.   Expected Discharge Plan: IP Rehab Facility Barriers to Discharge: Continued Medical Work up, Air Traffic Controller and Services   Discharge Planning Services: CM Consult Post Acute Care Choice: IP Rehab Living arrangements for the past 2 months: Single Family Home                                       Social Drivers of Health (SDOH) Interventions SDOH Screenings   Food Insecurity: No Food Insecurity (07/30/2024)  Housing: Low Risk  (07/30/2024)  Transportation Needs: No Transportation Needs (07/30/2024)  Utilities: Not At Risk (07/30/2024)  Depression (PHQ2-9): Low Risk  (01/12/2024)  Tobacco Use: Medium Risk (06/29/2024)    Readmission Risk Interventions    05/17/2022    3:02 PM  Readmission Risk Prevention Plan  Transportation Screening Complete  PCP or Specialist Appt within 5-7 Days Complete  Home Care Screening Complete  Medication Review (RN CM) Complete

## 2024-08-02 NOTE — Progress Notes (Signed)
 Patient ID: [name], female   DOB: [birth year], 63 y.o.   MRN: [id number] BP (!) 104/51 (BP Location: Left Arm)   Pulse 79   Temp 98.6 F (37 C) (Oral)   Resp 17   Ht 5' 2 (1.575 m)   Wt 100.7 kg   LMP 11/20/2012   SpO2 93%   BMI 40.60 kg/m  Alert and oriented Spoke with insurance today, they again denied IR and said snf She will appeal again Wound vac in place. PT did not come today. As long as she is still here she needs pt, and ot.

## 2024-08-03 NOTE — Plan of Care (Signed)
  Problem: Education: Goal: Knowledge of General Education information will improve Description: Including pain rating scale, medication(s)/side effects and non-pharmacologic comfort measures Outcome: Progressing   Problem: Clinical Measurements: Goal: Ability to maintain clinical measurements within normal limits will improve Outcome: Progressing Goal: Will remain free from infection Outcome: Progressing Goal: Diagnostic test results will improve Outcome: Progressing Goal: Respiratory complications will improve Outcome: Progressing Goal: Cardiovascular complication will be avoided Outcome: Progressing   Problem: Nutrition: Goal: Adequate nutrition will be maintained Outcome: Progressing   Problem: Coping: Goal: Level of anxiety will decrease Outcome: Progressing   Problem: Elimination: Goal: Will not experience complications related to bowel motility Outcome: Progressing Goal: Will not experience complications related to urinary retention Outcome: Progressing   Problem: Pain Managment: Goal: General experience of comfort will improve and/or be controlled Outcome: Progressing   Problem: Safety: Goal: Ability to remain free from injury will improve Outcome: Progressing   Problem: Skin Integrity: Goal: Risk for impaired skin integrity will decrease Outcome: Progressing   Problem: Education: Goal: Ability to verbalize activity precautions or restrictions will improve Outcome: Progressing Goal: Knowledge of the prescribed therapeutic regimen will improve Outcome: Progressing   Problem: Activity: Goal: Ability to avoid complications of mobility impairment will improve Outcome: Progressing Goal: Ability to tolerate increased activity will improve Outcome: Progressing   Problem: Bowel/Gastric: Goal: Gastrointestinal status for postoperative course will improve Outcome: Progressing   Problem: Clinical Measurements: Goal: Ability to maintain clinical measurements  within normal limits will improve Outcome: Progressing Goal: Postoperative complications will be avoided or minimized Outcome: Progressing Goal: Diagnostic test results will improve Outcome: Progressing   Problem: Pain Management: Goal: Pain level will decrease Outcome: Progressing   Problem: Skin Integrity: Goal: Will show signs of wound healing Outcome: Progressing   Problem: Bladder/Genitourinary: Goal: Urinary functional status for postoperative course will improve Outcome: Progressing

## 2024-08-03 NOTE — Plan of Care (Signed)
   Problem: Health Behavior/Discharge Planning: Goal: Ability to manage health-related needs will improve Outcome: Progressing   Problem: Clinical Measurements: Goal: Ability to maintain clinical measurements within normal limits will improve Outcome: Progressing

## 2024-08-03 NOTE — Progress Notes (Signed)
 Physical Therapy Treatment Patient Details Name: [name] MRN: [id number] DOB: 08-04-1961 Today's Date: 08/03/2024   History of Present Illness Pt is a 63 y/o F admitted for thoracic stenosis at T9/10 and LE weakness, now s/p thoracic decompression with neurosurgery on 06/29/24. Of note, pt had T9-10 laminectomy on 01/08/24 and was admitted to [facility] for rehab. PMHx: GERD, anxiety, HTN, fibromyalgia, hyperthyroidism, IBS, ACDF 3/4, posterior lateral arthrodesis 6/16, CSF leak 6/24.    PT Comments  Pt received in supine and agreeable to session. Pt demonstrates improved bed mobility requiring less assist, but requires increased time due to weakness and fatigue.  Pt able to stand and tolerate a short gait trial with min A +2 and a chair follow for safety due to quick fatigue and impaired balance. Pt heavily relies on BUE support due to RLE weakness and instability. Pt able to stand again from recliner after seated rest, but requires increased assist from lower surface. Pt continues to benefit from PT services to progress toward functional mobility goals.    If plan is discharge home, recommend the following: A lot of help with walking and/or transfers;A lot of help with bathing/dressing/bathroom;Assist for transportation;Help with stairs or ramp for entrance   Can travel by private vehicle     No  Equipment Recommendations  Hospital bed;Hoyer lift    Recommendations for Other Services       Precautions / Restrictions Precautions Precautions: Back;Fall Recall of Precautions/Restrictions: Intact Precaution/Restrictions Comments: R AFO, wound vac Restrictions Weight Bearing Restrictions Per Provider Order: No     Mobility  Bed Mobility Overal bed mobility: Needs Assistance Bed Mobility: Rolling, Sidelying to Sit Rolling: Min assist, Used rails Sidelying to sit: Min assist       General bed mobility comments: cues for technique and increased time required. Assist for RLE  management and trunk elevation    Transfers Overall transfer level: Needs assistance Equipment used: Rolling walker (2 wheels) Transfers: Sit to/from Stand Sit to Stand: Mod assist, +2 physical assistance, +2 safety/equipment, Min assist           General transfer comment: STS from elevated EOB with min A +2 and from recliner with mod A +2 for power up. Pt with difficulty transitioning hands to RW requiring increased time and assist for stability    Ambulation/Gait Ambulation/Gait assistance: Min assist, +2 physical assistance, +2 safety/equipment Gait Distance (Feet): 5 Feet Assistive device: Rolling walker (2 wheels) Gait Pattern/deviations: Step-to pattern, Decreased stride length, Decreased step length - left, Trunk flexed       General Gait Details: Pt demonstrates slow steps with heavy reliance on BUE support due to BLE (R>L) weakness and instability. Increased time and effort to advance RLE and some decreased awareness of placement   Stairs             Wheelchair Mobility     Tilt Bed    Modified Rankin (Stroke Patients Only)       Balance Overall balance assessment: Needs assistance Sitting-balance support: Feet supported, Bilateral upper extremity supported Sitting balance-[name]: Fair Sitting balance - Comments: EOB   Standing balance support: Bilateral upper extremity supported, Reliant on assistive device for balance, During functional activity Standing balance-[name]: Poor Standing balance comment: reliant on RW support                            Communication Communication Communication: No apparent difficulties  Cognition Arousal: Alert Behavior During  Therapy: WFL for tasks assessed/performed   PT - Cognitive impairments: No apparent impairments                         Following commands: Intact      Cueing Cueing Techniques: Verbal cues, Gestural cues, Tactile cues  Exercises      General Comments         Pertinent Vitals/Pain Pain Assessment Pain Assessment: Faces Faces Pain Scale: Hurts even more Pain Location: back Pain Descriptors / Indicators: Discomfort, Guarding, Grimacing Pain Intervention(s): Monitored during session, Limited activity within patient's tolerance     PT Goals (current goals can now be found in the care plan section) Acute Rehab PT Goals Patient Stated Goal: to get more therapy PT Goal Formulation: With patient Time For Goal Achievement: 08/10/24 Progress towards PT goals: Progressing toward goals    Frequency    Min 2X/week       AM-PAC PT 6 Clicks Mobility   Outcome Measure  Help needed turning from your back to your side while in a flat bed without using bedrails?: A Little Help needed moving from lying on your back to sitting on the side of a flat bed without using bedrails?: A Little Help needed moving to and from a bed to a chair (including a wheelchair)?: Total Help needed standing up from a chair using your arms (e.g., wheelchair or bedside chair)?: Total Help needed to walk in hospital room?: Total Help needed climbing 3-5 steps with a railing? : Total 6 Click Score: 10    End of Session Equipment Utilized During Treatment: Gait belt;Other (comment) (R AFO) Activity Tolerance: Patient tolerated treatment well Patient left: in chair;with call bell/phone within reach (with OT) Nurse Communication: Mobility status PT Visit Diagnosis: Unsteadiness on feet (R26.81);Other abnormalities of gait and mobility (R26.89);Muscle weakness (generalized) (M62.81)     Time: 8861-8794 PT Time Calculation (min) (ACUTE ONLY): 27 min  Charges:    $Gait Training: 8-22 mins $Therapeutic Activity: 8-22 mins PT General Charges $$ ACUTE PT VISIT: 1 Visit                     Darryle George, PTA Acute Rehabilitation Services Secure Chat Preferred  Office:([phone]) [phone]    Darryle George 08/03/2024, 1:25 PM

## 2024-08-03 NOTE — Progress Notes (Signed)
 Occupational Therapy Treatment Patient Details Name: [name] MRN: [id number] DOB: [birth year] Today's Date: 08/03/2024   History of present illness Pt is a 63 y/o F admitted for thoracic stenosis at T9/10 and LE weakness, now s/p thoracic decompression with neurosurgery on 06/29/24. Of note, pt had T9-10 laminectomy on 01/08/24 and was admitted to [facility] for rehab. PMHx: GERD, anxiety, HTN, fibromyalgia, hyperthyroidism, IBS, ACDF 3/4, posterior lateral arthrodesis 6/16, CSF leak 6/24.   OT comments  Pt seated EOB with PT staff present in room, agreeable for OT visit. Pt progressed to taking steps to sink in preparation for ADLs today with RW. She needed min A +2 to stand from elevated bed height and min-mod A +2 to ambulate to sink via RW with chair follow. Extensive cueing for PLB technique, rest breaks, and using BUE to offload BLE's. After seated rest, pt tolerated standing at the sink ~4 mins to complete various grooming tasks. Assisted with LB dressing via long handled shoehorn, receptive to learning equipment. Left upright in chair, moderately fatigued. Tolerated session well, will continue to follow.      If plan is discharge home, recommend the following:  Two people to help with walking and/or transfers;Assist for transportation;Help with stairs or ramp for entrance;Assistance with cooking/housework;A lot of help with bathing/dressing/bathroom   Equipment Recommendations  Other (comment) (defer)    Recommendations for Other Services      Precautions / Restrictions Precautions Precautions: Back;Fall Recall of Precautions/Restrictions: Intact Precaution/Restrictions Comments: R AFO, wound vac Restrictions Weight Bearing Restrictions Per Provider Order: No       Mobility Bed Mobility               General bed mobility comments: pt received seated EOB with PT staff present upon arrival    Transfers Overall transfer level: Needs assistance Equipment used: Rolling  walker (2 wheels) Transfers: Sit to/from Stand, Bed to chair/wheelchair/BSC Sit to Stand: Min assist, +2 physical assistance, +2 safety/equipment, From elevated surface, Mod assist     Step pivot transfers: Min assist, +2 physical assistance, +2 safety/equipment     General transfer comment: Stood from elevated bed height with min A +2, needs mod A +2 to stand from chair 2/2 lower surface height, cues for proper hand placement and momentum swing.     Balance Overall balance assessment: Needs assistance Sitting-balance support: Bilateral upper extremity supported, Feet supported Sitting balance-[name]: Fair Sitting balance - Comments: seated EOB, reliant on BUE for unsupported sitting Postural control: Posterior lean Standing balance support: Bilateral upper extremity supported, Reliant on assistive device for balance, During functional activity Standing balance-[name]: Poor Standing balance comment: heavily reliant on BUE WB through RW in stance                           ADL either performed or assessed with clinical judgement   ADL Overall ADL's : Needs assistance/impaired     Grooming: Minimal assistance;Standing;Oral care Grooming Details (indicate cue type and reason): needs A to manage toothpaste bottle & to retrieve paper towels from dispenser             Lower Body Dressing: Maximal assistance;Sitting/lateral leans;Cueing for compensatory techniques;Cueing for back precautions;With adaptive equipment Lower Body Dressing Details (indicate cue type and reason): donned L sneaker with use of long handled shoehorn, needs A to align foot within sneaker and A for donning B compression stockings & R AFO  Functional mobility during ADLs: Contact guard assist;Minimal assistance;Rolling walker (2 wheels)      Extremity/Trunk Assessment              Vision       Perception     Praxis     Communication Communication Communication:  No apparent difficulties   Cognition Arousal: Alert Behavior During Therapy: WFL for tasks assessed/performed Cognition: No apparent impairments                               Following commands: Intact        Cueing   Cueing Techniques: Verbal cues, Gestural cues  Exercises      Shoulder Instructions       General Comments wound vac intact    Pertinent Vitals/ Pain       Pain Assessment Pain Assessment: Faces Faces Pain Scale: Hurts even more Pain Location: back Pain Descriptors / Indicators: Discomfort Pain Intervention(s): Monitored during session, Repositioned  Home Living                                          Prior Functioning/Environment              Frequency  Min 2X/week        Progress Toward Goals  OT Goals(current goals can now be found in the care plan section)  Progress towards OT goals: Progressing toward goals     Plan      Co-evaluation    PT/OT/SLP Co-Evaluation/Treatment: Yes Reason for Co-Treatment: For patient/therapist safety;To address functional/ADL transfers   OT goals addressed during session: ADL's and self-care      AM-PAC OT 6 Clicks Daily Activity     Outcome Measure   Help from another person eating meals?: None Help from another person taking care of personal grooming?: A Little Help from another person toileting, which includes using toliet, bedpan, or urinal?: A Lot Help from another person bathing (including washing, rinsing, drying)?: A Lot Help from another person to put on and taking off regular upper body clothing?: A Little Help from another person to put on and taking off regular lower body clothing?: Total 6 Click Score: 15    End of Session Equipment Utilized During Treatment: Gait belt  OT Visit Diagnosis: Unsteadiness on feet (R26.81);Muscle weakness (generalized) (M62.81);Pain   Activity Tolerance Patient tolerated treatment well   Patient Left in  chair;with call bell/phone within reach;with SCD's reapplied   Nurse Communication Mobility status        Time: 8849-8767 OT Time Calculation (min): 42 min  Charges: OT General Charges $OT Visit: 1 Visit OT Treatments $Self Care/Home Management : 23-37 mins  Alaisha Eversley D., MSOT, OTR/L Acute Rehabilitation Services [phone] Secure Chat Preferred  [name] 08/03/2024, 3:47 PM

## 2024-08-03 NOTE — Progress Notes (Signed)
 Patient ID: [name], female   DOB: Feb 10, 1961, 63 y.o.   MRN: [id number] BP (!) 135/58 (BP Location: Left Arm)   Pulse 75   Temp 98.6 F (37 C) (Oral)   Resp 19   Ht 5' 2 (1.575 m)   Wt 100.7 kg   LMP 11/20/2012   SpO2 100%   BMI 40.60 kg/m  Alert and oriented x 4 Speech is clear and fluent Moving extremities Will appeal [employer]

## 2024-08-03 NOTE — TOC Progression Note (Signed)
 Transition of Care [facility]) - Progression Note    Patient Details  Name: [name] MRN: [id number] Date of Birth: November 13, 1960  Transition of Care [facility]) CM/SW Contact  [name], [name] Phone Number: 08/03/2024, 1:16 PM  Clinical Narrative:   CSW provided patient with appeal information and answered questions. Patient will call and complete fast appeal for AIR Denial. CSW to follow.    Expected Discharge Plan: IP Rehab Facility Barriers to Discharge: Continued Medical Work up, Air Traffic Controller and Services   Discharge Planning Services: CM Consult Post Acute Care Choice: IP Rehab Living arrangements for the past 2 months: Single Family Home                                       Social Drivers of Health (SDOH) Interventions SDOH Screenings   Food Insecurity: No Food Insecurity (07/30/2024)  Housing: Low Risk  (07/30/2024)  Transportation Needs: No Transportation Needs (07/30/2024)  Utilities: Not At Risk (07/30/2024)  Depression (PHQ2-9): Low Risk  (01/12/2024)  Tobacco Use: Medium Risk (06/29/2024)    Readmission Risk Interventions    05/17/2022    3:02 PM  Readmission Risk Prevention Plan  Transportation Screening Complete  PCP or Specialist Appt within 5-7 Days Complete  Home Care Screening Complete  Medication Review (RN CM) Complete

## 2024-08-04 NOTE — Progress Notes (Signed)
 Patient ID: [name], female   DOB: 11/25/1960, 63 y.o.   MRN: [id number] BP 96/77 (BP Location: Left Arm)   Pulse 70   Temp (!) 97.5 F (36.4 C) (Oral)   Resp 19   Ht 5' 2 (1.575 m)   Wt 100.7 kg   LMP 11/20/2012   SpO2 96%   BMI 40.60 kg/m  Alert and oriented x 4, speech is clear and fluent. Moving all extremities well Will change wound vac tomorrow.

## 2024-08-04 NOTE — TOC Progression Note (Signed)
 Transition of Care [facility]) - Progression Note    Patient Details  Name: [name] MRN: [id number] Date of Birth: Feb 09, 1961  Transition of Care [facility]) CM/SW Contact  [name], [name] Phone Number: 08/04/2024, 4:23 PM  Clinical Narrative:   CSW met with patient to discuss appeal. Patient reported that she called to appeal, spoke with two different people to plead her case. Patient said they did not provide her with a fax number, but told her to have the doctor resubmit the authorization and they'd get it approved. CSW relayed information to [facility]. [employer] is having technical difficulties, unable to check auth status or receive updates at this time; will attempt once they are back up and running. CSW to follow.    Expected Discharge Plan: IP Rehab Facility Barriers to Discharge: Continued Medical Work up, Air Traffic Controller and Services   Discharge Planning Services: CM Consult Post Acute Care Choice: IP Rehab Living arrangements for the past 2 months: Single Family Home                                       Social Drivers of Health (SDOH) Interventions SDOH Screenings   Food Insecurity: No Food Insecurity (07/30/2024)  Housing: Low Risk  (07/30/2024)  Transportation Needs: No Transportation Needs (07/30/2024)  Utilities: Not At Risk (07/30/2024)  Depression (PHQ2-9): Low Risk  (01/12/2024)  Tobacco Use: Medium Risk (06/29/2024)    Readmission Risk Interventions    05/17/2022    3:02 PM  Readmission Risk Prevention Plan  Transportation Screening Complete  PCP or Specialist Appt within 5-7 Days Complete  Home Care Screening Complete  Medication Review (RN CM) Complete

## 2024-08-04 NOTE — Progress Notes (Signed)
 Physical Therapy Treatment Patient Details Name: [name] MRN: [id number] DOB: [birth year] Today's Date: 08/04/2024   History of Present Illness Pt is a 63 y/o F admitted for thoracic stenosis at T9/10 and LE weakness, now s/p thoracic decompression with neurosurgery on 06/29/24. Of note, pt had T9-10 laminectomy on 01/08/24 and was admitted to [facility] for rehab. PMHx: GERD, anxiety, HTN, fibromyalgia, hyperthyroidism, IBS, ACDF 3/4, posterior lateral arthrodesis 6/16, CSF leak 6/24.    PT Comments  Pt received in supine and agreeable to session. Pt reports 10/10 back pain, but is motivated to progress functional mobility. Pt requires increased time to complete mobility tasks due to weakness and pain. Pt able to increase gait distance this session, however is limited by increased pain and fatigue. Pt heavily relies on BUE to offload BLE during ambulation. Pt continues to benefit from PT services to progress toward functional mobility goals.    If plan is discharge home, recommend the following: A lot of help with walking and/or transfers;A lot of help with bathing/dressing/bathroom;Assist for transportation;Help with stairs or ramp for entrance   Can travel by private vehicle     No  Equipment Recommendations  Hospital bed;Hoyer lift    Recommendations for Other Services       Precautions / Restrictions Precautions Precautions: Back;Fall Precaution Booklet Issued: No Recall of Precautions/Restrictions: Intact Precaution/Restrictions Comments: R AFO, wound vac Restrictions Weight Bearing Restrictions Per Provider Order: No     Mobility  Bed Mobility Overal bed mobility: Needs Assistance Bed Mobility: Rolling, Sidelying to Sit Rolling: Min assist, Used rails Sidelying to sit: Mod assist       General bed mobility comments: Assist for RLE advancement to EOB, trunk elevation, and scooting to EOB    Transfers Overall transfer level: Needs assistance Equipment used: Rolling  walker (2 wheels) Transfers: Sit to/from Stand Sit to Stand: Mod assist, +2 physical assistance           General transfer comment: From EOB with mod A +2 for power up and cues for R foot placement due to decreased awareness    Ambulation/Gait Ambulation/Gait assistance: Min assist, +2 safety/equipment Gait Distance (Feet): 10 Feet Assistive device: Rolling walker (2 wheels) Gait Pattern/deviations: Step-to pattern, Decreased stride length, Decreased step length - left, Trunk flexed, Decreased stance time - right       General Gait Details: Pt demonstrates slow steps with heavy reliance on BUE support due to BLE (R>L) weakness and instability. Increased time and effort to advance RLE. Assist for stability and close chair follow for safety   Stairs             Wheelchair Mobility     Tilt Bed    Modified Rankin (Stroke Patients Only)       Balance Overall balance assessment: Needs assistance Sitting-balance support: Bilateral upper extremity supported, Feet supported Sitting balance-[name]: Fair Sitting balance - Comments: seated EOB, reliant on BUE for unsupported sitting with posterior bias   Standing balance support: Bilateral upper extremity supported, Reliant on assistive device for balance, During functional activity Standing balance-[name]: Poor Standing balance comment: heavily reliant on BUE WB through RW in stance                            Communication Communication Communication: No apparent difficulties  Cognition Arousal: Alert Behavior During Therapy: WFL for tasks assessed/performed   PT - Cognitive impairments: No apparent impairments  Following commands: Intact      Cueing Cueing Techniques: Verbal cues, Gestural cues  Exercises      General Comments        Pertinent Vitals/Pain Pain Assessment Pain Assessment: 0-10 Pain Score: 10-Worst pain ever Pain Location: back,  BLE Pain Descriptors / Indicators: Discomfort, Aching Pain Intervention(s): Limited activity within patient's tolerance, Monitored during session, Repositioned     PT Goals (current goals can now be found in the care plan section) Acute Rehab PT Goals Patient Stated Goal: to get more therapy PT Goal Formulation: With patient Time For Goal Achievement: 08/10/24 Progress towards PT goals: Progressing toward goals    Frequency    Min 2X/week       AM-PAC PT 6 Clicks Mobility   Outcome Measure  Help needed turning from your back to your side while in a flat bed without using bedrails?: A Little Help needed moving from lying on your back to sitting on the side of a flat bed without using bedrails?: A Lot Help needed moving to and from a bed to a chair (including a wheelchair)?: A Lot Help needed standing up from a chair using your arms (e.g., wheelchair or bedside chair)?: A Lot Help needed to walk in hospital room?: Total Help needed climbing 3-5 steps with a railing? : Total 6 Click Score: 11    End of Session Equipment Utilized During Treatment: Gait belt;Other (comment) (R AFO) Activity Tolerance: Patient tolerated treatment well Patient left: in chair;with call bell/phone within reach;with chair alarm set Nurse Communication: Mobility status PT Visit Diagnosis: Unsteadiness on feet (R26.81);Other abnormalities of gait and mobility (R26.89);Muscle weakness (generalized) (M62.81)     Time: 1335-1410 PT Time Calculation (min) (ACUTE ONLY): 35 min  Charges:    $Gait Training: 8-22 mins $Therapeutic Activity: 8-22 mins PT General Charges $$ ACUTE PT VISIT: 1 Visit                     Darryle George, PTA Acute Rehabilitation Services Secure Chat Preferred  Office:([phone]    Darryle George 08/04/2024, 2:38 PM

## 2024-08-04 NOTE — Plan of Care (Signed)
  Problem: Education: Goal: Knowledge of General Education information will improve Description: Including pain rating scale, medication(s)/side effects and non-pharmacologic comfort measures Outcome: Progressing   Problem: Clinical Measurements: Goal: Ability to maintain clinical measurements within normal limits will improve Outcome: Progressing Goal: Will remain free from infection Outcome: Progressing Goal: Diagnostic test results will improve Outcome: Progressing Goal: Respiratory complications will improve Outcome: Progressing Goal: Cardiovascular complication will be avoided Outcome: Progressing   Problem: Activity: Goal: Risk for activity intolerance will decrease Outcome: Progressing   Problem: Nutrition: Goal: Adequate nutrition will be maintained Outcome: Progressing   Problem: Coping: Goal: Level of anxiety will decrease Outcome: Progressing   Problem: Elimination: Goal: Will not experience complications related to bowel motility Outcome: Progressing Goal: Will not experience complications related to urinary retention Outcome: Progressing   Problem: Pain Managment: Goal: General experience of comfort will improve and/or be controlled Outcome: Progressing   Problem: Safety: Goal: Ability to remain free from injury will improve Outcome: Progressing   Problem: Skin Integrity: Goal: Risk for impaired skin integrity will decrease Outcome: Progressing   Problem: Education: Goal: Ability to verbalize activity precautions or restrictions will improve Outcome: Progressing Goal: Knowledge of the prescribed therapeutic regimen will improve Outcome: Progressing   Problem: Activity: Goal: Ability to avoid complications of mobility impairment will improve Outcome: Progressing Goal: Ability to tolerate increased activity will improve Outcome: Progressing Goal: Will remain free from falls Outcome: Progressing   Problem: Bowel/Gastric: Goal: Gastrointestinal  status for postoperative course will improve Outcome: Progressing   Problem: Clinical Measurements: Goal: Ability to maintain clinical measurements within normal limits will improve Outcome: Progressing Goal: Postoperative complications will be avoided or minimized Outcome: Progressing Goal: Diagnostic test results will improve Outcome: Progressing   Problem: Pain Management: Goal: Pain level will decrease Outcome: Progressing   Problem: Skin Integrity: Goal: Will show signs of wound healing Outcome: Progressing   Problem: Health Behavior/Discharge Planning: Goal: Identification of resources available to assist in meeting health care needs will improve Outcome: Progressing   Problem: Bladder/Genitourinary: Goal: Urinary functional status for postoperative course will improve Outcome: Progressing

## 2024-08-04 NOTE — Progress Notes (Cosign Needed)
 Wound vac in place, therapy continues.

## 2024-08-05 NOTE — Progress Notes (Signed)
 Occupational Therapy Treatment Patient Details Name: LIBBY GOEHRING MRN: [id number] DOB: 08-05-1961 Today's Date: 08/05/2024   History of present illness Pt is a 63 y/o F admitted for thoracic stenosis at T9/10 and LE weakness, now s/p thoracic decompression with neurosurgery on 06/29/24. Of note, pt had T9-10 laminectomy on 01/08/24 and was admitted to [facility] for rehab. PMHx: GERD, anxiety, HTN, fibromyalgia, hyperthyroidism, IBS, ACDF 3/4, posterior lateral arthrodesis 6/16, CSF leak 6/24.   OT comments  Patient demonstrating good gains with OT treatment with bed mobility and able to perform self care tasks seated on EOB. Patient could benefit from further AE training for LB dressing and to address standing for ADLs.  Patient will benefit from intensive inpatient follow-up therapy, >3 hours/day. Acute OT to continue to follow to address established goals to facilitate DC to next venue of care.        If plan is discharge home, recommend the following:  Two people to help with walking and/or transfers;Assist for transportation;Help with stairs or ramp for entrance;Assistance with cooking/housework;A lot of help with bathing/dressing/bathroom   Equipment Recommendations  Other (comment) (defer)    Recommendations for Other Services      Precautions / Restrictions Precautions Precautions: Back;Fall Precaution Booklet Issued: No Recall of Precautions/Restrictions: Intact Precaution/Restrictions Comments: R AFO, wound vac Restrictions Weight Bearing Restrictions Per Provider Order: No       Mobility Bed Mobility Overal bed mobility: Needs Assistance Bed Mobility: Rolling, Sidelying to Sit Rolling: Min assist, Used rails Sidelying to sit: Mod assist       General bed mobility comments: bed pad used to assist with rolling to right and assistance to raise trunk    Transfers Overall transfer level: Needs assistance Equipment used: Rolling walker (2 wheels) Transfers: Sit to/from  Stand, Bed to chair/wheelchair/BSC Sit to Stand: Mod assist, Max assist, From elevated surface     Step pivot transfers: Mod assist     General transfer comment: mod to max assist to stand from raised bed and mod assist for step pivot transfer to recliner with assistance for walker management     Balance Overall balance assessment: Needs assistance Sitting-balance support: Bilateral upper extremity supported, Feet supported Sitting balance-[name]: Fair Sitting balance - Comments: able to perform self care tasks seated on EOB   Standing balance support: Bilateral upper extremity supported, Reliant on assistive device for balance, During functional activity Standing balance-[name]: Poor Standing balance comment: reliant on RW for support                           ADL either performed or assessed with clinical judgement   ADL Overall ADL's : Needs assistance/impaired     Grooming: Wash/dry hands;Wash/dry face;Supervision/safety;Sitting Grooming Details (indicate cue type and reason): EOB Upper Body Bathing: Supervision/ safety;Set up;Sitting Upper Body Bathing Details (indicate cue type and reason): on EOB with assistance for back Lower Body Bathing: Moderate assistance;Sitting/lateral leans Lower Body Bathing Details (indicate cue type and reason): seated on EOB wtih lateral leaning for peri area bathing and assistance for bathing below knees Upper Body Dressing : Minimal assistance;Cueing for sequencing;Sitting Upper Body Dressing Details (indicate cue type and reason): gown change Lower Body Dressing: Maximal assistance;Sitting/lateral leans Lower Body Dressing Details (indicate cue type and reason): total assist for compression stockings                    Extremity/Trunk Assessment  Vision       Perception     Praxis     Communication Communication Communication: No apparent difficulties   Cognition Arousal:  Alert Behavior During Therapy: WFL for tasks assessed/performed Cognition: No apparent impairments             OT - Cognition Comments: able to recall back precautions                 Following commands: Intact        Cueing   Cueing Techniques: Verbal cues, Gestural cues  Exercises      Shoulder Instructions       General Comments VSS on RA    Pertinent Vitals/ Pain       Pain Assessment Pain Assessment: Faces Faces Pain Scale: Hurts little more Pain Location: back, BLE Pain Descriptors / Indicators: Discomfort, Aching Pain Intervention(s): Limited activity within patient's tolerance, Monitored during session, Premedicated before session, Repositioned  Home Living                                          Prior Functioning/Environment              Frequency  Min 2X/week        Progress Toward Goals  OT Goals(current goals can now be found in the care plan section)  Progress towards OT goals: Progressing toward goals  Acute Rehab OT Goals Patient Stated Goal: to get stronger OT Goal Formulation: With patient Time For Goal Achievement: 08/10/24 Potential to Achieve Goals: Fair ADL Goals Pt Will Perform Grooming: with contact guard assist;standing (with assist of lift equipment PRN) Pt Will Perform Upper Body Dressing: with modified independence;sitting Pt Will Perform Lower Body Dressing: with contact guard assist;with adaptive equipment;sitting/lateral leans Pt Will Transfer to Toilet: with mod assist;with +2 assist;squat pivot transfer;stand pivot transfer;bedside commode Additional ADL Goal #1: Pt will recall spine precautions 100% of the time during OT sessions for improved safety. Additional ADL Goal #2: Pt will maintain sitting balance EOB with no more than supervision to demonstrate improved balance for ADL engagement.  Plan      Co-evaluation                 AM-PAC OT 6 Clicks Daily Activity     Outcome  Measure   Help from another person eating meals?: None Help from another person taking care of personal grooming?: A Little Help from another person toileting, which includes using toliet, bedpan, or urinal?: A Lot Help from another person bathing (including washing, rinsing, drying)?: A Lot Help from another person to put on and taking off regular upper body clothing?: A Little Help from another person to put on and taking off regular lower body clothing?: Total 6 Click Score: 15    End of Session Equipment Utilized During Treatment: Rolling walker (2 wheels)  OT Visit Diagnosis: Unsteadiness on feet (R26.81);Muscle weakness (generalized) (M62.81);Pain Pain - part of body:  (back)   Activity Tolerance Patient tolerated treatment well   Patient Left in chair;with call bell/phone within reach   Nurse Communication Mobility status;Need for lift equipment        Time: [phone] OT Time Calculation (min): 41 min  Charges: OT General Charges $OT Visit: 1 Visit OT Treatments $Self Care/Home Management : 38-52 mins  Dick Laine, OTA Acute Rehabilitation Services  Office [phone]   Jeb LITTIE Laine  08/05/2024, 8:28 AM

## 2024-08-05 NOTE — Progress Notes (Signed)
 Patient ID: [name], female   DOB: Aug 06, 1961, 63 y.o.   MRN: [id number] BP (!) 110/55 (BP Location: Left Arm)   Pulse 72   Temp 98.5 F (36.9 C) (Oral)   Resp 16   Ht 5' 2 (1.575 m)   Wt 100.7 kg   LMP 11/20/2012   SpO2 100%   BMI 40.60 kg/m  Alert and oriented Wound vac in place Moving extremities Receiving PT, OT Will resubmit information to [facility].

## 2024-08-06 ENCOUNTER — Inpatient Hospital Stay (HOSPITAL_COMMUNITY)

## 2024-08-06 NOTE — Progress Notes (Signed)
 Patient ID: [name], female   DOB: [birth year], 63 y.o.   MRN: [id number] BP (!) 126/46 (BP Location: Left Arm)   Pulse 72   Temp 97.8 F (36.6 C) (Oral)   Resp 18   Ht 5' 2 (1.575 m)   Wt 100.7 kg   LMP 11/20/2012   SpO2 98%   BMI 40.60 kg/m  Alert and oriented Letter for review has been sent to [facility].  No report yet on ankle mri Moving all extremities Continue pt

## 2024-08-06 NOTE — Plan of Care (Signed)

## 2024-08-06 NOTE — Progress Notes (Signed)
 Physical Therapy Treatment Patient Details Name: [name] MRN: [id number] DOB: 03-07-1961 Today's Date: 08/06/2024   History of Present Illness Pt is a 63 y/o F admitted for thoracic stenosis at T9/10 and LE weakness, now s/p thoracic decompression with neurosurgery on 06/29/24. Of note, pt had T9-10 laminectomy on 01/08/24 and was admitted to [facility] for rehab. PMHx: GERD, anxiety, HTN, fibromyalgia, hyperthyroidism, IBS, ACDF 3/4, posterior lateral arthrodesis 6/16, CSF leak 6/24.    PT Comments  Pt received in supine and agreeable to session. Pt demonstrates good progress towards mobility goals and improved activity tolerance. Pt able to tolerate short gait trial with min A +2 with slightly improved stability this session, but continues to rely heavily on BUE support to offload RLE. When sitting to recliner, pt's R knee buckled with pt sitting quickly. Pt able to tolerate additional stand from recliner and step pivot to EOB. Patient will benefit from intensive inpatient follow-up therapy, >3 hours/day to maximize progress towards functional mobility goals. Acutely, pt continues to benefit from PT services to progress toward functional mobility goals.     If plan is discharge home, recommend the following: A lot of help with walking and/or transfers;A lot of help with bathing/dressing/bathroom;Assist for transportation;Help with stairs or ramp for entrance   Can travel by private vehicle        Equipment Recommendations  Hospital bed;Hoyer lift    Recommendations for Other Services       Precautions / Restrictions Precautions Precautions: Back;Fall Recall of Precautions/Restrictions: Intact Precaution/Restrictions Comments: R AFO, wound vac Restrictions Weight Bearing Restrictions Per Provider Order: No     Mobility  Bed Mobility Overal bed mobility: Needs Assistance Bed Mobility: Rolling, Sidelying to Sit, Sit to Sidelying Rolling: Min assist, Used rails Sidelying to sit: Mod  assist   Sit to supine: Mod assist, HOB elevated, Used rails   General bed mobility comments: Cues for sequencing and assist for BLE management and trunk elevation    Transfers Overall transfer level: Needs assistance Equipment used: Rolling walker (2 wheels) Transfers: Sit to/from Stand, Bed to chair/wheelchair/BSC Sit to Stand: Mod assist, +2 physical assistance, Min assist, From elevated surface   Step pivot transfers: Min assist, +2 safety/equipment       General transfer comment: Min A +2 from elevated EOB and mod A +2 from recliner for power up and anterior weight shift. increased difficulty stepping backward to pivot back to bed with min A for stability and RW management    Ambulation/Gait Ambulation/Gait assistance: Min assist, +2 safety/equipment Gait Distance (Feet): 10 Feet Assistive device: Rolling walker (2 wheels) Gait Pattern/deviations: Step-to pattern, Decreased stride length, Decreased step length - left, Trunk flexed, Decreased stance time - right, Step-through pattern       General Gait Details: Slow steps with increased effort to advance RLE. Min A for stability and chair follow for safety. Heavy reliance on BUE support on RW   Stairs             Wheelchair Mobility     Tilt Bed    Modified Rankin (Stroke Patients Only)       Balance Overall balance assessment: Needs assistance Sitting-balance support: Bilateral upper extremity supported, Feet supported Sitting balance-[name]: Fair Sitting balance - Comments: reliant on UE support for balance   Standing balance support: Bilateral upper extremity supported, Reliant on assistive device for balance, During functional activity Standing balance-[name]: Poor Standing balance comment: reliant on RW for support  Communication Communication Communication: No apparent difficulties  Cognition Arousal: Alert Behavior During Therapy: WFL for tasks  assessed/performed   PT - Cognitive impairments: No apparent impairments                         Following commands: Intact      Cueing Cueing Techniques: Verbal cues, Gestural cues  Exercises      General Comments        Pertinent Vitals/Pain Pain Assessment Pain Assessment: Faces Faces Pain Scale: Hurts little more Pain Location: back, BLE Pain Descriptors / Indicators: Discomfort, Aching Pain Intervention(s): Limited activity within patient's tolerance, Monitored during session, Repositioned, Patient requesting pain meds-RN notified     PT Goals (current goals can now be found in the care plan section) Acute Rehab PT Goals Patient Stated Goal: to get more therapy PT Goal Formulation: With patient Time For Goal Achievement: 08/10/24 Progress towards PT goals: Progressing toward goals    Frequency    Min 2X/week       AM-PAC PT 6 Clicks Mobility   Outcome Measure  Help needed turning from your back to your side while in a flat bed without using bedrails?: A Little Help needed moving from lying on your back to sitting on the side of a flat bed without using bedrails?: A Lot Help needed moving to and from a bed to a chair (including a wheelchair)?: A Lot Help needed standing up from a chair using your arms (e.g., wheelchair or bedside chair)?: A Lot Help needed to walk in hospital room?: Total Help needed climbing 3-5 steps with a railing? : Total 6 Click Score: 11    End of Session Equipment Utilized During Treatment: Gait belt;Other (comment) (R AFO) Activity Tolerance: Patient tolerated treatment well Patient left: with call bell/phone within reach;in bed;with nursing/sitter in room Nurse Communication: Mobility status PT Visit Diagnosis: Unsteadiness on feet (R26.81);Other abnormalities of gait and mobility (R26.89);Muscle weakness (generalized) (M62.81)     Time: [id number] PT Time Calculation (min) (ACUTE ONLY): 38 min  Charges:     $Gait Training: 8-22 mins $Therapeutic Activity: 23-37 mins PT General Charges $$ ACUTE PT VISIT: 1 Visit                     Darryle George, PTA Acute Rehabilitation Services Secure Chat Preferred  [phone]    Darryle George 08/06/2024, 3:51 PM

## 2024-08-06 NOTE — TOC Progression Note (Signed)
 Transition of Care [facility]) - Progression Note    Patient Details  Name: [name] MRN: [id number] Date of Birth: [birth year]  Transition of Care [facility]) CM/SW Contact  Almarie [name], [name] Phone Number: 08/06/2024, 4:27 PM  Clinical Narrative:   CSW contacted by [facility] that they have not been able to submit anything to [facility] as the portal has been down. Fax number provided, CSW to submit fax with expedited appeal information. CSW spoke with MD, letter provided to support the appeal process. CSW faxed information to [facility], awaiting response.    Expected Discharge Plan: IP Rehab Facility Barriers to Discharge: Continued Medical Work up, Air [employer] and Services   Discharge Planning Services: CM Consult Post Acute Care Choice: IP Rehab Living arrangements for the past 2 months: Single Family Home                                       Social Drivers of Health (SDOH) Interventions SDOH Screenings   Food Insecurity: No Food Insecurity (07/30/2024)  Housing: Low Risk  (07/30/2024)  Transportation Needs: No Transportation Needs (07/30/2024)  Utilities: Not At Risk (07/30/2024)  Depression (PHQ2-9): Low Risk  (01/12/2024)  Tobacco Use: Medium Risk (06/29/2024)    Readmission Risk Interventions    05/17/2022    3:02 PM  Readmission Risk Prevention Plan  Transportation Screening Complete  PCP or Specialist Appt within 5-7 Days Complete  Home Care Screening Complete  Medication Review (RN CM) Complete

## 2024-08-06 NOTE — Plan of Care (Signed)

## 2024-08-06 NOTE — TOC Progression Note (Signed)
 Transition of Care [facility]) - Progression Note    Patient Details  Name: [name] MRN: [id number] Date of Birth: 1961/08/23  Transition of Care [facility]) CM/SW Contact  [name], [name] Phone Number: 08/06/2024, 12:23 PM  Clinical Narrative:   CSW spoke with Admissions at [facility], they are attempting to see if they can resubmit the auth request but [facility] is having technical difficulties with their portal and they have not been able to make any progress yet. [facility] to keep CSW updated with progress. CSW to follow.    Expected Discharge Plan: IP Rehab Facility Barriers to Discharge: Continued Medical Work up, Air Traffic Controller and Services   Discharge Planning Services: CM Consult Post Acute Care Choice: IP Rehab Living arrangements for the past 2 months: Single Family Home                                       Social Drivers of Health (SDOH) Interventions SDOH Screenings   Food Insecurity: No Food Insecurity (07/30/2024)  Housing: Low Risk  (07/30/2024)  Transportation Needs: No Transportation Needs (07/30/2024)  Utilities: Not At Risk (07/30/2024)  Depression (PHQ2-9): Low Risk  (01/12/2024)  Tobacco Use: Medium Risk (06/29/2024)    Readmission Risk Interventions    05/17/2022    3:02 PM  Readmission Risk Prevention Plan  Transportation Screening Complete  PCP or Specialist Appt within 5-7 Days Complete  Home Care Screening Complete  Medication Review (RN CM) Complete

## 2024-08-07 NOTE — Progress Notes (Signed)
 Patient ID: Cathryne LILLETTE Masters, female   DOB: Feb 03, 1961, 63 y.o.   MRN: [id number] BP (!) 109/55 (BP Location: Left Arm)   Pulse 72   Temp 98.2 F (36.8 C) (Oral)   Resp 18   Ht 5' 2 (1.575 m)   Wt 100.7 kg   LMP 11/20/2012   SpO2 95%   BMI 40.60 kg/m  Alert and oriented x 4 Speech is clear and fluent Waiting on placement Ankle mri shows Achilles tendinopathy

## 2024-08-07 NOTE — Plan of Care (Signed)
  Problem: Education: Goal: Knowledge of General Education information will improve Description: Including pain rating scale, medication(s)/side effects and non-pharmacologic comfort measures Outcome: Progressing   Problem: Clinical Measurements: Goal: Ability to maintain clinical measurements within normal limits will improve Outcome: Progressing Goal: Will remain free from infection Outcome: Progressing   Problem: Activity: Goal: Risk for activity intolerance will decrease Outcome: Progressing   Problem: Nutrition: Goal: Adequate nutrition will be maintained Outcome: Progressing   Problem: Coping: Goal: Level of anxiety will decrease Outcome: Progressing   Problem: Pain Managment: Goal: General experience of comfort will improve and/or be controlled Outcome: Progressing   Problem: Skin Integrity: Goal: Risk for impaired skin integrity will decrease Outcome: Progressing

## 2024-08-07 NOTE — Plan of Care (Signed)
  Problem: Education: Goal: Knowledge of General Education information will improve Description: Including pain rating scale, medication(s)/side effects and non-pharmacologic comfort measures Outcome: Progressing   Problem: Health Behavior/Discharge Planning: Goal: Ability to manage health-related needs will improve Outcome: Progressing   Problem: Clinical Measurements: Goal: Ability to maintain clinical measurements within normal limits will improve Outcome: Progressing Goal: Will remain free from infection Outcome: Progressing Goal: Diagnostic test results will improve Outcome: Progressing Goal: Respiratory complications will improve Outcome: Progressing Goal: Cardiovascular complication will be avoided Outcome: Progressing   Problem: Activity: Goal: Risk for activity intolerance will decrease Outcome: Progressing   Problem: Coping: Goal: Level of anxiety will decrease Outcome: Progressing   Problem: Elimination: Goal: Will not experience complications related to bowel motility Outcome: Progressing Goal: Will not experience complications related to urinary retention Outcome: Progressing   Problem: Pain Managment: Goal: General experience of comfort will improve and/or be controlled Outcome: Progressing   Problem: Safety: Goal: Ability to remain free from injury will improve Outcome: Progressing   Problem: Skin Integrity: Goal: Risk for impaired skin integrity will decrease Outcome: Progressing   Problem: Activity: Goal: Ability to avoid complications of mobility impairment will improve Outcome: Progressing Goal: Ability to tolerate increased activity will improve Outcome: Progressing Goal: Will remain free from falls Outcome: Progressing   Problem: Bowel/Gastric: Goal: Gastrointestinal status for postoperative course will improve Outcome: Progressing   Problem: Clinical Measurements: Goal: Ability to maintain clinical measurements within normal limits  will improve Outcome: Progressing Goal: Postoperative complications will be avoided or minimized Outcome: Progressing Goal: Diagnostic test results will improve Outcome: Progressing   Problem: Health Behavior/Discharge Planning: Goal: Identification of resources available to assist in meeting health care needs will improve Outcome: Progressing

## 2024-08-08 NOTE — Progress Notes (Signed)
 Patient ID: [name], female   DOB: Apr 14, 1961, 63 y.o.   MRN: [id number] BP (!) 123/53 (BP Location: Left Arm)   Pulse 71   Temp 98 F (36.7 C) (Oral)   Resp 18   Ht 5' 2 (1.575 m)   Wt 100.7 kg   LMP 11/20/2012   SpO2 96%   BMI 40.60 kg/m  Alert and oriented x 4 Not clear why no PT over the weekend. No pt is equal to no improvement. I will reorder.  Waiting on placement decision.

## 2024-08-08 NOTE — Plan of Care (Signed)
  Problem: Education: Goal: Knowledge of General Education information will improve Description: Including pain rating scale, medication(s)/side effects and non-pharmacologic comfort measures Outcome: Progressing   Problem: Clinical Measurements: Goal: Ability to maintain clinical measurements within normal limits will improve Outcome: Progressing Goal: Will remain free from infection Outcome: Progressing   Problem: Nutrition: Goal: Adequate nutrition will be maintained Outcome: Progressing   Problem: Coping: Goal: Level of anxiety will decrease Outcome: Progressing   Problem: Pain Managment: Goal: General experience of comfort will improve and/or be controlled Outcome: Progressing   Problem: Safety: Goal: Ability to remain free from injury will improve Outcome: Progressing

## 2024-08-08 NOTE — TOC Progression Note (Signed)
 Transition of Care [facility]) - Progression Note    Patient Details  Name: LAJOYCE TAMURA MRN: [id number] Date of Birth: 01/28/1961  Transition of Care [facility]) CM/SW Contact  [name], [name] Phone Number: 08/08/2024, 10:15 AM  Clinical Narrative:   CSW received call from [facility] that an AOR form needed to be sent to process the appeal. CSW met with patient, explained form, and obtained signature. AOR form faxed to [facility] for appeal, awaiting response.    Expected Discharge Plan: IP Rehab Facility Barriers to Discharge: Continued Medical Work up, [employer] and Services   Discharge Planning Services: CM Consult Post Acute Care Choice: IP Rehab Living arrangements for the past 2 months: Single Family Home                                       Social Drivers of Health (SDOH) Interventions SDOH Screenings   Food Insecurity: No Food Insecurity (07/30/2024)  Housing: Low Risk  (07/30/2024)  Transportation Needs: No Transportation Needs (07/30/2024)  Utilities: Not At Risk (07/30/2024)  Depression (PHQ2-9): Low Risk  (01/12/2024)  Tobacco Use: Medium Risk (06/29/2024)    Readmission Risk Interventions    05/17/2022    3:02 PM  Readmission Risk Prevention Plan  Transportation Screening Complete  PCP or Specialist Appt within 5-7 Days Complete  Home Care Screening Complete  Medication Review (RN CM) Complete

## 2024-08-09 NOTE — Plan of Care (Signed)
  Problem: Education: Goal: Knowledge of General Education information will improve Description: Including pain rating scale, medication(s)/side effects and non-pharmacologic comfort measures Outcome: Progressing   Problem: Education: Goal: Knowledge of General Education information will improve Description: Including pain rating scale, medication(s)/side effects and non-pharmacologic comfort measures Outcome: Progressing   Problem: Health Behavior/Discharge Planning: Goal: Ability to manage health-related needs will improve Outcome: Progressing   Problem: Clinical Measurements: Goal: Ability to maintain clinical measurements within normal limits will improve Outcome: Progressing Goal: Will remain free from infection Outcome: Progressing Goal: Diagnostic test results will improve Outcome: Progressing Goal: Respiratory complications will improve Outcome: Progressing Goal: Cardiovascular complication will be avoided Outcome: Progressing   Problem: Activity: Goal: Risk for activity intolerance will decrease Outcome: Progressing   Problem: Nutrition: Goal: Adequate nutrition will be maintained Outcome: Progressing   Problem: Coping: Goal: Level of anxiety will decrease Outcome: Progressing   Problem: Elimination: Goal: Will not experience complications related to bowel motility Outcome: Progressing Goal: Will not experience complications related to urinary retention Outcome: Progressing   Problem: Pain Managment: Goal: General experience of comfort will improve and/or be controlled Outcome: Progressing   Problem: Safety: Goal: Ability to remain free from injury will improve Outcome: Progressing   Problem: Skin Integrity: Goal: Risk for impaired skin integrity will decrease Outcome: Progressing   Problem: Education: Goal: Ability to verbalize activity precautions or restrictions will improve Outcome: Progressing Goal: Knowledge of the prescribed therapeutic  regimen will improve Outcome: Progressing Goal: Understanding of discharge needs will improve Outcome: Progressing   Problem: Activity: Goal: Ability to avoid complications of mobility impairment will improve Outcome: Progressing Goal: Ability to tolerate increased activity will improve Outcome: Progressing Goal: Will remain free from falls Outcome: Progressing   Problem: Bowel/Gastric: Goal: Gastrointestinal status for postoperative course will improve Outcome: Progressing   Problem: Clinical Measurements: Goal: Ability to maintain clinical measurements within normal limits will improve Outcome: Progressing Goal: Postoperative complications will be avoided or minimized Outcome: Progressing Goal: Diagnostic test results will improve Outcome: Progressing   Problem: Pain Management: Goal: Pain level will decrease Outcome: Progressing   Problem: Skin Integrity: Goal: Will show signs of wound healing Outcome: Progressing   Problem: Health Behavior/Discharge Planning: Goal: Identification of resources available to assist in meeting health care needs will improve Outcome: Progressing   Problem: Bladder/Genitourinary: Goal: Urinary functional status for postoperative course will improve Outcome: Progressing

## 2024-08-09 NOTE — Progress Notes (Signed)
 Patient ID: [name], female   DOB: 09/02/1961, 63 y.o.   MRN: [id number] BP (!) 106/59 (BP Location: Right Arm)   Pulse 74   Temp (!) 97.4 F (36.3 C) (Oral)   Resp 17   Ht 5' 2 (1.575 m)   Wt 100.7 kg   LMP 11/20/2012   SpO2 91%   BMI 40.60 kg/m  Alert and oriented x 4, speech is clear and fluent Working hard in PT. Did 52' with a break in the middle  Waiting on [facility]

## 2024-08-09 NOTE — Plan of Care (Signed)
   Problem: Education: Goal: Knowledge of General Education information will improve Description: Including pain rating scale, medication(s)/side effects and non-pharmacologic comfort measures Outcome: Progressing   Problem: Clinical Measurements: Goal: Ability to maintain clinical measurements within normal limits will improve Outcome: Progressing Goal: Will remain free from infection Outcome: Progressing   Problem: Activity: Goal: Risk for activity intolerance will decrease Outcome: Progressing   Problem: Nutrition: Goal: Adequate nutrition will be maintained Outcome: Progressing   Problem: Pain Managment: Goal: General experience of comfort will improve and/or be controlled Outcome: Progressing   Problem: Safety: Goal: Ability to remain free from injury will improve Outcome: Progressing

## 2024-08-09 NOTE — Progress Notes (Signed)
 Physical Therapy Treatment Patient Details Name: [name] MRN: [id number] DOB: 10/16/1960 Today's Date: 08/09/2024   History of Present Illness Pt is a 63 y/o F admitted for thoracic stenosis at T9/10 and LE weakness, now s/p thoracic decompression with neurosurgery on 06/29/24. Of note, pt had T9-10 laminectomy on 01/08/24 and was admitted to [facility] for rehab. PMHx: GERD, anxiety, HTN, fibromyalgia, hyperthyroidism, IBS, ACDF 3/4, posterior lateral arthrodesis 6/16, CSF leak 6/24.    PT Comments  Pt continuing to make functional progress toward PT goals. Pt requiring min-modA for bed mobility and assist to don R AFO, socks, and shoes. Initial STS required modA and inc time to power up, and pt able to ambulate for 2 bouts of 13' with a seated rest break in w/c in between. With second STS, pt demonstrated inc ability to power up to stand. During ambulation, noted effortful advancement of R LE, requiring great unloading through UE to be able to do so. Pt appearing highly motivated to advance functional mobility. Recommend post-acute rehab >3hrs/day to continue improving functional mobility and independence. Acute PT to follow.     If plan is discharge home, recommend the following: A lot of help with walking and/or transfers;A lot of help with bathing/dressing/bathroom;Assist for transportation;Help with stairs or ramp for entrance   Can travel by private vehicle     No  Equipment Recommendations  Hospital bed;Hoyer lift    Recommendations for Other Services       Precautions / Restrictions Precautions Precautions: Back;Fall Precaution Booklet Issued: No Recall of Precautions/Restrictions: Intact Precaution/Restrictions Comments: R AFO, wound vac Restrictions Weight Bearing Restrictions Per Provider Order: No     Mobility  Bed Mobility Overal bed mobility: Needs Assistance Bed Mobility: Rolling, Sidelying to Sit, Sit to Supine Rolling: Min assist, Used rails Sidelying to sit: Mod  assist   Sit to supine: Min assist, Used rails   General bed mobility comments: Assist for LE management during sidlying>sit and sit>supine.    Transfers Overall transfer level: Needs assistance Equipment used: Rolling walker (2 wheels) Transfers: Sit to/from Stand Sit to Stand: Mod assist, +2 physical assistance, Min assist, +2 safety/equipment, From elevated surface           General transfer comment: During first STS, pt required modA and inc time for powering up. During both STS, positioned and blocked both feet for stability.    Ambulation/Gait Ambulation/Gait assistance: Min assist, +2 safety/equipment Gait Distance (Feet): 13 Feet (x2) Assistive device: Rolling walker (2 wheels) Gait Pattern/deviations: Step-to pattern, Decreased stride length, Decreased step length - left, Trunk flexed, Decreased stance time - right, Step-through pattern Gait velocity: Dec Gait velocity interpretation: <1.31 ft/sec, indicative of household ambulator   General Gait Details: Effortful advancement of R LE, requiring great unloading of R LE with B UEs. W/c follow for safety. Required seated rest break between bouts of 13 feet.   Stairs             Wheelchair Mobility     Tilt Bed    Modified Rankin (Stroke Patients Only)       Balance Overall balance assessment: Needs assistance Sitting-balance support: Bilateral upper extremity supported, Feet supported Sitting balance-[name]: Fair     Standing balance support: Reliant on assistive device for balance, During functional activity, Bilateral upper extremity supported Standing balance-[name]: Poor Standing balance comment: reliant on RW for support  Communication Communication Communication: No apparent difficulties  Cognition Arousal: Alert Behavior During Therapy: WFL for tasks assessed/performed   PT - Cognitive impairments: No apparent impairments                          Following commands: Intact      Cueing Cueing Techniques: Verbal cues, Gestural cues  Exercises      General Comments General comments (skin integrity, edema, etc.): Pt demonstrated high motivation today to walk.      Pertinent Vitals/Pain Pain Assessment Pain Assessment: Faces Faces Pain Scale: Hurts little more Pain Location: Back Pain Descriptors / Indicators: Discomfort, Aching Pain Intervention(s): Limited activity within patient's tolerance, Monitored during session    Home Living                          Prior Function            PT Goals (current goals can now be found in the care plan section) Acute Rehab PT Goals PT Goal Formulation: With patient Time For Goal Achievement: 08/23/24 Potential to Achieve Goals: Fair Progress towards PT goals: Progressing toward goals    Frequency    Min 2X/week      PT Plan      Co-evaluation              AM-PAC PT 6 Clicks Mobility   Outcome Measure  Help needed turning from your back to your side while in a flat bed without using bedrails?: A Little Help needed moving from lying on your back to sitting on the side of a flat bed without using bedrails?: A Lot Help needed moving to and from a bed to a chair (including a wheelchair)?: A Lot Help needed standing up from a chair using your arms (e.g., wheelchair or bedside chair)?: A Lot Help needed to walk in hospital room?: A Lot Help needed climbing 3-5 steps with a railing? : Total 6 Click Score: 12    End of Session Equipment Utilized During Treatment: Gait belt;Other (comment) (R AFO, R compression sock) Activity Tolerance: Patient tolerated treatment well Patient left: in bed;with bed alarm set;with call bell/phone within reach Nurse Communication: Mobility status PT Visit Diagnosis: Unsteadiness on feet (R26.81);Other abnormalities of gait and mobility (R26.89);Muscle weakness (generalized) (M62.81)     Time:  0141-0221 PT Time Calculation (min) (ACUTE ONLY): 40 min  Charges:    $Therapeutic Activity: 38-52 mins PT General Charges $$ ACUTE PT VISIT: 1 Visit                     Vickki Igou, SPT    Cici Rodriges 08/09/2024, 3:21 PM

## 2024-08-10 NOTE — Progress Notes (Signed)
 Occupational Therapy Treatment Patient Details Name: [name] MRN: [id number] DOB: [birth year] Today's Date: 08/10/2024   History of present illness Pt is a 63 y/o F admitted for thoracic stenosis at T9/10 and LE weakness, now s/p thoracic decompression with neurosurgery on 06/29/24. Of note, pt had T9-10 laminectomy on 01/08/24 and was admitted to [facility] for rehab. PMHx: GERD, anxiety, HTN, fibromyalgia, hyperthyroidism, IBS, ACDF 3/4, posterior lateral arthrodesis 6/16, CSF leak 6/24.   OT comments  Progress update completed with 4 goals kept and two goals progressed secondary to pt with good progression. Noted rehab consult back in and believe pt would be an excellent candidate for intensive inpatient rehabilitation. Challenging return to BADL and functional transfers as below this session with min-mod A for transfers, min A for ambulation, and set-up for seated BADL this session. Limited by back and bil UE neuropathy today. RN in room on departure to administer pain medication.       If plan is discharge home, recommend the following:  Two people to help with walking and/or transfers;Assist for transportation;Help with stairs or ramp for entrance;Assistance with cooking/housework;A lot of help with bathing/dressing/bathroom   Equipment Recommendations  Other (comment) (defer)    Recommendations for Other Services Rehab consult    Precautions / Restrictions Precautions Precautions: Back;Fall Precaution Booklet Issued: No Recall of Precautions/Restrictions: Intact Precaution/Restrictions Comments: R AFO, wound vac Required Braces or Orthoses:  (no brace needed per active orders) Restrictions Weight Bearing Restrictions Per Provider Order: No       Mobility Bed Mobility Overal bed mobility: Needs Assistance Bed Mobility: Rolling, Sidelying to Sit, Sit to Supine Rolling: Used rails, Mod assist Sidelying to sit: Min assist   Sit to supine: Used rails, Mod assist   General bed  mobility comments: Assist for LE management during sidlying>sit and sit>supine.    Transfers Overall transfer level: Needs assistance Equipment used: Rolling walker (2 wheels) Transfers: Sit to/from Stand Sit to Stand: Min assist, From elevated surface           General transfer comment: LE block fromsliding, assist for rise. pt with good anterior weight shfit     Balance Overall balance assessment: Needs assistance Sitting-balance support: Bilateral upper extremity supported, Feet supported Sitting balance-[name]: Fair     Standing balance support: Reliant on assistive device for balance, During functional activity, Bilateral upper extremity supported Standing balance-[name]: Poor Standing balance comment: reliant on RW for support                           ADL either performed or assessed with clinical judgement   ADL Overall ADL's : Needs assistance/impaired     Grooming: Wash/dry hands;Wash/dry face;Supervision/safety;Sitting Grooming Details (indicate cue type and reason): walked to sink initially, but pt with BUE and back pain so significant she asked to return to seated position EOB, able to transition hands up to sink, but was not able to stand without BUE support             Lower Body Dressing: Total assistance;Sit to/from stand;Sitting/lateral leans   Toilet Transfer: Minimal assistance;Rolling walker (2 wheels) Toilet Transfer Details (indicate cue type and reason): up from elevated EOB                Extremity/Trunk Assessment              Vision       Perception     Praxis  Communication Communication Communication: No apparent difficulties   Cognition Arousal: Alert Behavior During Therapy: WFL for tasks assessed/performed Cognition: No apparent impairments                               Following commands: Intact        Cueing   Cueing Techniques: Verbal cues, Gestural cues  Exercises       Shoulder Instructions       General Comments      Pertinent Vitals/ Pain       Pain Assessment Pain Assessment: Faces Faces Pain Scale: Hurts even more Pain Location: back, BUE neuropathy Pain Descriptors / Indicators: Discomfort, Aching, Burning Pain Intervention(s): Limited activity within patient's tolerance, Monitored during session  Home Living                                          Prior Functioning/Environment              Frequency  Min 2X/week        Progress Toward Goals  OT Goals(current goals can now be found in the care plan section)  Progress towards OT goals: Progressing toward goals  Acute Rehab OT Goals Patient Stated Goal: get stronger OT Goal Formulation: With patient Time For Goal Achievement: 08/24/24 Potential to Achieve Goals: Fair  Plan      Co-evaluation                 AM-PAC OT 6 Clicks Daily Activity     Outcome Measure   Help from another person eating meals?: None Help from another person taking care of personal grooming?: A Little Help from another person toileting, which includes using toliet, bedpan, or urinal?: A Lot Help from another person bathing (including washing, rinsing, drying)?: A Lot Help from another person to put on and taking off regular upper body clothing?: A Little Help from another person to put on and taking off regular lower body clothing?: Total 6 Click Score: 15    End of Session Equipment Utilized During Treatment: Rolling walker (2 wheels)  OT Visit Diagnosis: Unsteadiness on feet (R26.81);Muscle weakness (generalized) (M62.81);Pain   Activity Tolerance Patient tolerated treatment well   Patient Left with call bell/phone within reach;in bed;with bed alarm set;with nursing/sitter in room   Nurse Communication Mobility status;Patient requests pain meds        Time: 1640-1715 OT Time Calculation (min): 35 min  Charges: OT General Charges $OT Visit: 1  Visit OT Treatments $Self Care/Home Management : 8-22 mins $Therapeutic Activity: 8-22 mins  Elma JONETTA Penner, OTD, OTR/L [facility] Acute Rehabilitation Office: [phone]   Elma JONETTA Penner 08/10/2024, 5:26 PM

## 2024-08-10 NOTE — Progress Notes (Signed)
 Patient ID: [name], female   DOB: 1961/08/23, 63 y.o.   MRN: [id number] BP (!) 161/73 (BP Location: Right Arm)   Pulse 74   Temp 97.8 F (36.6 C) (Oral)   Resp 15   Ht 5' 2 (1.575 m)   Wt 100.7 kg   LMP 11/20/2012   SpO2 95%   BMI 40.60 kg/m  Remains unclear why she is not receiving pt each day, understanding weekends have a reduced schedule. It seems that we are losing time when she could be working with skilled professionals.  Moving all extremities. Wound is clean, dry.

## 2024-08-10 NOTE — Plan of Care (Signed)
  Problem: Health Behavior/Discharge Planning: Goal: Ability to manage health-related needs will improve Outcome: Progressing   Problem: Clinical Measurements: Goal: Will remain free from infection Outcome: Progressing   Problem: Activity: Goal: Risk for activity intolerance will decrease Outcome: Progressing   Problem: Nutrition: Goal: Adequate nutrition will be maintained Outcome: Progressing   Problem: Elimination: Goal: Will not experience complications related to urinary retention Outcome: Progressing   Problem: Pain Managment: Goal: General experience of comfort will improve and/or be controlled Outcome: Progressing   Problem: Safety: Goal: Ability to remain free from injury will improve Outcome: Progressing

## 2024-08-10 NOTE — Plan of Care (Signed)

## 2024-08-11 NOTE — Progress Notes (Signed)
 Physical Therapy Treatment Patient Details Name: [name] MRN: [id number] DOB: 10-02-1961 Today's Date: 08/11/2024   History of Present Illness Pt is a 63 y/o F admitted for thoracic stenosis at T9/10 and LE weakness, now s/p thoracic decompression with neurosurgery on 06/29/24. Of note, pt had T9-10 laminectomy on 01/08/24 and was admitted to [facility] for rehab. PMHx: GERD, anxiety, HTN, fibromyalgia, hyperthyroidism, IBS, ACDF 3/4, posterior lateral arthrodesis 6/16, CSF leak 6/24.    PT Comments  Pt received in supine and agreeable to session. Pt demonstrates steady progress towards functional mobility goals. Pt continues to require increased time for bed mobility and transfers due to weakness and pain. Pt able to tolerate increased gait distance this session with 2 standing rest breaks, but no seated breaks. Pt demonstrates slow, effortful steps with significant reliance on BUE support on RW. Pt continues to benefit from PT services to progress toward functional mobility goals.     If plan is discharge home, recommend the following: A lot of help with walking and/or transfers;A lot of help with bathing/dressing/bathroom;Assist for transportation;Help with stairs or ramp for entrance   Can travel by private vehicle     No  Equipment Recommendations  Hospital bed;Hoyer lift    Recommendations for Other Services       Precautions / Restrictions Precautions Precautions: Back;Fall Recall of Precautions/Restrictions: Intact Precaution/Restrictions Comments: R AFO, wound vac Restrictions Weight Bearing Restrictions Per Provider Order: No     Mobility  Bed Mobility Overal bed mobility: Needs Assistance Bed Mobility: Rolling, Sidelying to Sit Rolling: Contact guard assist Sidelying to sit: Min assist, Used rails       General bed mobility comments: increased time/effort and min A for BLE advancement to EOB and heavy use of bedrail.    Transfers Overall transfer level: Needs  assistance Equipment used: Rolling walker (2 wheels) Transfers: Sit to/from Stand Sit to Stand: Min assist, +2 physical assistance           General transfer comment: STS from slightly elevated EOB with min A +2 for power up. Pt relies on momentum    Ambulation/Gait Ambulation/Gait assistance: Min assist, +2 safety/equipment Gait Distance (Feet): 26 Feet Assistive device: Rolling walker (2 wheels) Gait Pattern/deviations: Decreased stride length, Decreased step length - left, Trunk flexed, Decreased stance time - right, Step-through pattern Gait velocity: Dec     General Gait Details: Slow steps with significant reliant on BUE for offloading and support and increased difficulty advancing RLE. 2 standing rest breaks due to BUE fatigue   Stairs             Wheelchair Mobility     Tilt Bed    Modified Rankin (Stroke Patients Only)       Balance Overall balance assessment: Needs assistance Sitting-balance support: Bilateral upper extremity supported, Feet supported Sitting balance-[name]: Fair Sitting balance - Comments: reliant on UE support for balance Postural control: Posterior lean Standing balance support: Reliant on assistive device for balance, During functional activity, Bilateral upper extremity supported Standing balance-[name]: Poor Standing balance comment: reliant on RW for support                            Communication Communication Communication: No apparent difficulties  Cognition Arousal: Alert Behavior During Therapy: WFL for tasks assessed/performed   PT - Cognitive impairments: No apparent impairments  Following commands: Intact      Cueing Cueing Techniques: Verbal cues, Gestural cues  Exercises      General Comments General comments (skin integrity, edema, etc.): Wound vac noted to have no suction, RN notified      Pertinent Vitals/Pain Pain Assessment Pain Assessment:  Faces Faces Pain Scale: Hurts little more Pain Location: back, RLE Pain Descriptors / Indicators: Discomfort, Aching, Grimacing, Guarding Pain Intervention(s): Limited activity within patient's tolerance, Monitored during session, Repositioned     PT Goals (current goals can now be found in the care plan section) Acute Rehab PT Goals Patient Stated Goal: to get more therapy PT Goal Formulation: With patient Time For Goal Achievement: 08/23/24 Progress towards PT goals: Progressing toward goals    Frequency    Min 2X/week       AM-PAC PT 6 Clicks Mobility   Outcome Measure  Help needed turning from your back to your side while in a flat bed without using bedrails?: A Little Help needed moving from lying on your back to sitting on the side of a flat bed without using bedrails?: A Little Help needed moving to and from a bed to a chair (including a wheelchair)?: A Lot Help needed standing up from a chair using your arms (e.g., wheelchair or bedside chair)?: A Lot Help needed to walk in hospital room?: A Lot Help needed climbing 3-5 steps with a railing? : Total 6 Click Score: 13    End of Session Equipment Utilized During Treatment: Gait belt;Other (comment) (R AFO, R compression stocking) Activity Tolerance: Patient tolerated treatment well;Patient limited by fatigue Patient left: in chair;with call bell/phone within reach Nurse Communication: Mobility status PT Visit Diagnosis: Unsteadiness on feet (R26.81);Other abnormalities of gait and mobility (R26.89);Muscle weakness (generalized) (M62.81)     Time: 9041-8964 PT Time Calculation (min) (ACUTE ONLY): 37 min  Charges:    $Gait Training: 8-22 mins $Therapeutic Activity: 8-22 mins PT General Charges $$ ACUTE PT VISIT: 1 Visit                     Darryle George, PTA Acute Rehabilitation Services Secure Chat Preferred  [phone]    Darryle George 08/11/2024, 11:37 AM

## 2024-08-11 NOTE — Progress Notes (Signed)
 Patient ID: [name], female   DOB: November 12, 1960, 63 y.o.   MRN: [id number] BP 121/68   Pulse 76   Temp 97.6 F (36.4 C) (Oral)   Resp 15   Ht 5' 2 (1.575 m)   Wt 100.7 kg   LMP 11/20/2012   SpO2 93%   BMI 40.60 kg/m  Alert and oriented x4 Speech is clear and fluent Moving all extremities Removed dressing.  Continue pt,ot Waiting on word from [facility]

## 2024-08-11 NOTE — Plan of Care (Signed)

## 2024-08-11 NOTE — Plan of Care (Signed)
  Problem: Health Behavior/Discharge Planning: Goal: Ability to manage health-related needs will improve Outcome: Progressing   Problem: Clinical Measurements: Goal: Will remain free from infection Outcome: Progressing   Problem: Activity: Goal: Risk for activity intolerance will decrease Outcome: Progressing   Problem: Nutrition: Goal: Adequate nutrition will be maintained Outcome: Progressing   Problem: Pain Managment: Goal: General experience of comfort will improve and/or be controlled Outcome: Progressing   Problem: Safety: Goal: Ability to remain free from injury will improve Outcome: Progressing   Problem: Skin Integrity: Goal: Risk for impaired skin integrity will decrease Outcome: Progressing

## 2024-08-12 NOTE — Progress Notes (Signed)
 Occupational Therapy Treatment Patient Details Name: [name] MRN: [id number] DOB: April 16, 1961 Today's Date: 08/12/2024   History of present illness Pt is a 63 y/o F admitted for thoracic stenosis at T9/10 and LE weakness, now s/p thoracic decompression with neurosurgery on 06/29/24. Of note, pt had T9-10 laminectomy on 01/08/24 and was admitted to [facility] for rehab. PMHx: GERD, anxiety, HTN, fibromyalgia, hyperthyroidism, IBS, ACDF 3/4, posterior lateral arthrodesis 6/16, CSF leak 6/24.   OT comments  RN reporting pt ready to participate now with OT. Pt received in supine, agreeable and willing to participate with OT upon return this PM. Pt with improved ADL performance & OOB mobility this date. AMPAC 16/24, indicating reduced functional performance. She was mod A to stand from w/c and bed and min A for ambulation with RW. Increased time spent completing LB dressing in preparation for ADLs at the sink. Mod A overall for LB ADLs via adaptive equipment. Needs total A for R AFO application. Upright standing tolerance improving to ~6' prior to needing seated rest break in w/c. Returned to supine, tolerating session well. OT to continue to follow.      If plan is discharge home, recommend the following:  Two people to help with walking and/or transfers;Assist for transportation;Help with stairs or ramp for entrance;Assistance with cooking/housework;A lot of help with bathing/dressing/bathroom   Equipment Recommendations  Other (comment) (defer)    Recommendations for Other Services      Precautions / Restrictions Precautions Precautions: Back;Fall Precaution Booklet Issued: No (maintained well throughout) Recall of Precautions/Restrictions: Intact Precaution/Restrictions Comments: R AFO; wound vac d/c'd 11/5 Restrictions Weight Bearing Restrictions Per Provider Order: No       Mobility Bed Mobility Overal bed mobility: Needs Assistance Bed Mobility: Rolling, Sidelying to Sit, Sit to  Supine Rolling: Contact guard assist Sidelying to sit: Min assist, HOB elevated, Used rails   Sit to supine: Min assist, HOB elevated, Used rails   General bed mobility comments: assist for trunk elevation and BLE mgmt for sit>supine transition    Transfers Overall transfer level: Needs assistance Equipment used: Rolling walker (2 wheels) Transfers: Sit to/from Stand Sit to Stand: Mod assist, From elevated surface           General transfer comment: Stood from bed with cues for hand placement, pt requesting R foot blocked, incr effort to come to fully upright posture     Balance Overall balance assessment: Needs assistance Sitting-balance support: Bilateral upper extremity supported, Feet supported, No upper extremity supported Sitting balance-[name]: Fair Sitting balance - Comments: seated EOB, occasionally leaning posteriorly 2/2 fatigue/back pain   Standing balance support: Reliant on assistive device for balance, During functional activity, Bilateral upper extremity supported Standing balance-[name]: Poor Standing balance comment: min A with cues for upright posture & forward gaze, heavily reliant on RW for BUE support                           ADL either performed or assessed with clinical judgement   ADL Overall ADL's : Needs assistance/impaired     Grooming: Wash/dry hands;Wash/dry face;Oral care;Contact guard assist;Minimal assistance;Sitting;Standing Grooming Details (indicate cue type and reason): stood to complete oral care, sat to wash face/hands with seated rest in between grooming tasks         Upper Body Dressing : Minimal assistance;Sitting Upper Body Dressing Details (indicate cue type and reason): assist to thread RUE through second gown around back per pt request Lower  Body Dressing: Moderate assistance;Cueing for back precautions;Cueing for compensatory techniques;Cueing for sequencing;With adaptive equipment;Sitting/lateral  leans Lower Body Dressing Details (indicate cue type and reason): donned B socks and doffed hospital socks with reacher/sock aide; OT donned R AFO, pt able to fasten proximal strap at knee, needs A for placing and donning R shoe, able to don L shoe with shoehorn with min A, needs A for removing all LB clothing items             Functional mobility during ADLs: Contact guard assist;Minimal assistance;Rolling walker (2 wheels)      Extremity/Trunk Assessment              Vision       Perception     Praxis     Communication Communication Communication: No apparent difficulties   Cognition Arousal: Alert Behavior During Therapy: WFL for tasks assessed/performed               OT - Cognition Comments: tearful initially 2/2 hearing about her dog needing to be put down; spirits and mood improved as session progressed and with OT distraction                 Following commands: Intact        Cueing   Cueing Techniques: Verbal cues, Gestural cues  Exercises      Shoulder Instructions       General Comments back incision site appeared C/D/I    Pertinent Vitals/ Pain       Pain Assessment Pain Assessment: Faces Faces Pain Scale: Hurts little more Pain Location: back, generalized Pain Descriptors / Indicators: Discomfort, Grimacing Pain Intervention(s): Monitored during session, Repositioned, Premedicated before session  Home Living                                          Prior Functioning/Environment              Frequency  Min 2X/week        Progress Toward Goals  OT Goals(current goals can now be found in the care plan section)  Progress towards OT goals: Progressing toward goals     Plan      Co-evaluation                 AM-PAC OT 6 Clicks Daily Activity     Outcome Measure   Help from another person eating meals?: None Help from another person taking care of personal grooming?: A Little Help from  another person toileting, which includes using toliet, bedpan, or urinal?: A Lot Help from another person bathing (including washing, rinsing, drying)?: A Lot Help from another person to put on and taking off regular upper body clothing?: A Little Help from another person to put on and taking off regular lower body clothing?: A Lot 6 Click Score: 16    End of Session Equipment Utilized During Treatment: Gait belt;Rolling walker (2 wheels)  OT Visit Diagnosis: Unsteadiness on feet (R26.81);Muscle weakness (generalized) (M62.81);Pain Pain - part of body:  (back)   Activity Tolerance Patient tolerated treatment well   Patient Left with call bell/phone within reach;in bed;with bed alarm set;with nursing/sitter in room   Nurse Communication Mobility status        Time: 8446-8352 OT Time Calculation (min): 54 min  Charges: OT General Charges $OT Visit: 1 Visit OT Treatments $Self Care/Home Management : 53-67  mins  Keshav Winegar D., MSOT, OTR/L Acute Rehabilitation Services [phone] Secure Chat Preferred  Da Michelle 08/12/2024, 5:12 PM

## 2024-08-12 NOTE — Plan of Care (Signed)

## 2024-08-12 NOTE — Progress Notes (Signed)
 Patient ID: [name], female   DOB: Apr 14, 1961, 63 y.o.   MRN: [id number] BP (!) 122/57 (BP Location: Left Arm)   Pulse 76   Temp 98.7 F (37.1 C) (Oral)   Resp 18   Ht 5' 2 (1.575 m)   Wt 100.7 kg   LMP 11/20/2012   SpO2 97%   BMI 40.60 kg/m  No changes. Works with OT, PT Have called Ortho about her ankle Waiting on placement

## 2024-08-12 NOTE — Progress Notes (Signed)
 OT Cancellation Note  Patient Details Name: [name] MRN: [id number] DOB: [birth year]   Cancelled Treatment:    Reason Eval/Treat Not Completed: Other (comment) (Attempted to see pt, she reports she just found out her son needs to put her dog down and wishing OT to return later. Will continue efforts.)   Marvina Danner D., MSOT, OTR/L Acute Rehabilitation Services [phone] Secure Chat Preferred  Lyndie Vanderloop 08/12/2024, 2:48 PM

## 2024-08-12 NOTE — Plan of Care (Signed)

## 2024-08-13 NOTE — Plan of Care (Signed)
  Problem: Health Behavior/Discharge Planning: Goal: Ability to manage health-related needs will improve Outcome: Progressing   Problem: Clinical Measurements: Goal: Will remain free from infection Outcome: Progressing   Problem: Activity: Goal: Risk for activity intolerance will decrease Outcome: Progressing   Problem: Nutrition: Goal: Adequate nutrition will be maintained Outcome: Progressing   Problem: Elimination: Goal: Will not experience complications related to bowel motility Outcome: Progressing   Problem: Pain Managment: Goal: General experience of comfort will improve and/or be controlled Outcome: Progressing   Problem: Safety: Goal: Ability to remain free from injury will improve Outcome: Progressing   Problem: Skin Integrity: Goal: Risk for impaired skin integrity will decrease Outcome: Progressing

## 2024-08-13 NOTE — Consult Note (Signed)
 Reason for Consult:Right ankle weakness Referring Physician: Rockey Peru Time called: [id number] Time at bedside: 0958   [name] is an 63 y.o. female.  HPI: [name] has been having right ankle problems for about 8 months or so. She describes an episode of seeming dislocation without known trauma in February or March when she notes that her foot was pointing the wrong direction. This was causing no pain. She manually relocated it (that did cause some pain). Since then she describes a tendency for the foot to externally rotate and slide when trying to stand or walk. She denies pain. She also has experience where her proprioception is off to the point where she cannot locate her leg in space. She also notes BLE neuropathy. MRI done 10d ago showed some tendinopathy and orthopedic surgery was consulted.   Past Medical History:  Diagnosis Date   Anemia    Ankle syndesmosis disruption    Anxiety    takes Ativan  and Valium , after mother passed   Arthritis    bilateral knees s/p knee replacement bilaterally   Asthma    Bronchitis    Bruising    pt states unexplained d/t fibromyalgia   Chronic back pain    2012 tailbone surgery and 3 lower discs.    Closed fracture of distal end of right fibula and tibia    Depression    from Fibromyalgia diagnosis; not taking medicine. since 2001   Dizziness    rarely   Fibromyalgia    diagnosed 2001   GERD (gastroesophageal reflux disease)    Prilosec occasionally   Headache(784.0)    sinus headaches   History of hiatal hernia    Hypertension    since 2013   Hyperthyroidism    subclinical, no treatment; thyroid  nodules   IBS (irritable bowel syndrome)    Impaired memory    states from fibromyalgia   Insomnia    takes Ambien    Jones fracture    left foot fifth metatarsal   Multiple allergies    including latex, pet dander, shellfish, pet dander   Painful orthopaedic hardware right ankle 05/27/2016   Seasonal allergies    Shortness of  breath    Occasional with exertion;    Sleep apnea    Sore gums    this is why pt is on Amoxil-only takes for dental work   Tachycardia    Thyroid  goiter    Varicose vein    protrudes above skin-per pt;vein popped and bruised;ultrasound done to make sure that there were no clots;noclots were found    Past Surgical History:  Procedure Laterality Date   ANTERIOR CERVICAL DECOMP/DISCECTOMY FUSION N/A 12/08/2020   Procedure: Cervical Four-Five Anterior cervical decompression/discectomy/fusion;  Surgeon: Peru Rockey, MD;  Location[facility] OR;  Service: Neurosurgery;  Laterality: N/A;  anterior   ANTERIOR LUMBAR FUSION  09/20/2011   Procedure: ANTERIOR LUMBAR FUSION 1 LEVEL;  Surgeon: Rockey LITTIE Peru;  Location: [facility]URO ORS;  Service: Neurosurgery;  Laterality: N/A;  Lumbar five-Sacral One Anterior Lumbar Interbody Fusion /Dr. Early to Approach    APPLICATION OF WOUND VAC N/A 05/14/2022   Procedure: Removal of wound vac with closure of wound;  Surgeon: Peru Rockey, MD;  Location: [facility] OR;  Service: Neurosurgery;  Laterality: N/A;  Pt to be admitted on 05-12-2022   CERVICAL DISC SURGERY     WITH TITANIUM PLATE IN NECK---LEFT SIDE   CERVICAL SPINE SURGERY  03/24/2019   CESAREAN SECTION     EYE SURGERY Bilateral 2023  cataract   fibroidectomy     FRACTURE SURGERY  05/08/2010   Jones fracture left foot fifth metatarsal   HARDWARE REMOVAL Right 05/28/2016   Procedure: RIGHT ANKLE HARDWARE REMOVAL;  Surgeon: Lamar Millman, MD;  Location: [facility];  Service: Orthopedics;  Laterality: Right;   HARDWARE REMOVAL N/A 07/28/2020   Procedure: Removal of Lumbar Hardware;  Surgeon: Gillie Duncans, MD;  Location: [facility] OR;  Service: Neurosurgery;  Laterality: N/A;   HERNIA REPAIR     hiatal hernia   IR THORACIC DISC ASPIRATION W/IMG GUIDE  10/29/2023   IRRIGATION AND DEBRIDEMENT KNEE  04/09/2012   Procedure: IRRIGATION AND DEBRIDEMENT KNEE;  Surgeon: LELON JONETTA Shari Mickey., MD;  Location: [facility] OR;   Service: Orthopedics;  Laterality: Right;   JOINT REPLACEMENT  01/27/2012   left total knee and Right total knee   KNEE SURGERY  2005   Left knee arthroscopy   LAMINECTOMY WITH POSTERIOR LATERAL ARTHRODESIS LEVEL 4 Bilateral 03/22/2022   Procedure: Thoracic ten to Lumbar three Posterior lateral arthrodesis with screws;  Surgeon: Gillie Duncans, MD;  Location: [facility] OR;  Service: Neurosurgery;  Laterality: Bilateral;   LUMBAR WOUND DEBRIDEMENT N/A 04/08/2022   Procedure: LUMBAR WOUND DEBRIDEMENT;  Surgeon: Gillie Duncans, MD;  Location: [facility] OR;  Service: Neurosurgery;  Laterality: N/A;   NASAL SEPTOPLASTY W/ TURBINOPLASTY  2007   due to recurrent sinusitis   ORIF ANKLE FRACTURE Right 06/21/2015   Procedure: OPEN REDUCTION INTERNAL FIXATION RIGHT DISTAL FIBULA  FRACTURE AND OPEN REDUCTION INTERNAL FIXATION SYNDESMOSIS ;  Surgeon: Lamar Millman, MD;  Location: [facility];  Service: Orthopedics;  Laterality: Right;   PARTIAL HYSTERECTOMY     SHOULDER ARTHROSCOPY W/ ROTATOR CUFF REPAIR Right    SHOULDER SURGERY Left    SPINE SURGERY  2004   Cervical plate, ACDF   STERIOD INJECTION  01/27/2012   Procedure: STEROID INJECTION;  Surgeon: Lamar DELENA Millman, MD;  Location: [facility] OR;  Service: Orthopedics;  Laterality: Right;   TEE WITHOUT CARDIOVERSION N/A 03/01/2020   Procedure: TRANSESOPHAGEAL ECHOCARDIOGRAM (TEE);  Surgeon: Jeffrie Oneil BROCKS, MD;  Location: [facility] ENDOSCOPY;  Service: Cardiovascular;  Laterality: N/A;   THORACIC DISCECTOMY  02/16/2020   Procedure: Thoracic ten-eleven Discectomy;  Surgeon: Gillie Duncans, MD;  Location: [facility] OR;  Service: Neurosurgery;;   THORACIC DISCECTOMY N/A 01/08/2024   Procedure: THORACIC NINE-TEN LAMINECTOMY AND DECOMPRESSION;  Surgeon: Gillie Duncans, MD;  [facility];  Service: Neurosurgery;  Laterality: N/A;   THORACIC DISCECTOMY N/A 06/29/2024   Procedure: THORACIC NINE THORACIC TEN LAMINECTOMY with  prevena plus 125 therapy;  Surgeon: Gillie Duncans, MD;   Location: [facility] OR;  Service: Neurosurgery;  Laterality: N/A;   TONSILLECTOMY  2007   TOTAL KNEE ARTHROPLASTY  01/27/2012   Procedure: TOTAL KNEE ARTHROPLASTY;  Surgeon: Lamar DELENA Millman, MD;  Bilateral   TOTAL KNEE ARTHROPLASTY  04/06/2012   Procedure: TOTAL KNEE ARTHROPLASTY;  Surgeon: Lamar DELENA Millman, MD;  Location: [facility] OR;  Service: Orthopedics;  Laterality: Right;   TUBAL LIGATION     UTERINE FIBROID SURGERY     mid 200s   WOUND EXPLORATION N/A 02/27/2020   Procedure: WOUND EXPLORATION;  Surgeon: Gillie Duncans, MD;  Location: [facility] OR;  Service: Neurosurgery;  Laterality: N/A;,  (wound vac upper back)   WOUND EXPLORATION N/A 03/30/2022   Procedure: THORACIC WOUND EXPLORATION WITH REPAIR OF CSF LEAK;  Surgeon: Gillie Duncans, MD;  Location: [facility] OR;  Service: Neurosurgery;  Laterality: N/A;    Family History  Problem Relation Age of Onset   Stroke Father        passed 2004 from pneumonia   Alcohol  abuse Father    Pulmonary embolism Mother        after minor knee surgery leading to DVT   Arthritis Sister    Depression Sister    Hypertension Sister    Diabetes Other        grandmother   Anesthesia problems Neg Hx    Hypotension Neg Hx    Malignant hyperthermia Neg Hx    Pseudochol deficiency Neg Hx     Social History:  reports that she quit smoking about 25 years ago. Her smoking use included cigarettes. She started smoking about 35 years ago. She has a 5 pack-year smoking history. She has never used smokeless tobacco. She reports that she does not drink alcohol  and does not use drugs.  Allergies:  Allergies  Allergen Reactions   Monosodium Glutamate Anaphylaxis and Swelling    Eyes swollen shut, facial swelling, tongue swelling.   Shellfish Allergy Anaphylaxis   Melatonin Other (See Comments)    Muscle spasms involuntary movement   Baclofen Nausea Only    Dizziness and increase muscle spasm   Celebrex  [Celecoxib ] Itching    Only allergic to generic brand   Ciprofloxacin  Other  (See Comments)    inability to walk/Unable to walk   Contrast Media [Iodinated Contrast Media] Itching and Nausea Only    could not walk   Diclofenac  Itching    Generic Diclefenac gel causes itching. Can take the name brand Voltaren  gel   Gadolinium     Allergic to MRI contrast dye per patient.   Molds & Smuts Other (See Comments)    Causes allergies to flair up/sinus issues/headaches     Other Other (See Comments)    Pet dander,        Promethazine  Itching   Statins Other (See Comments)    pain   Sulfa  Antibiotics Other (See Comments)    Rash, migraine    Latex Itching and Rash    Latex glove with powder; bite blocks used for EGD studies    Medications: I have reviewed the patient's current medications.  No results found for this or any previous visit (from the past 48 hours).  No results found.  Review of Systems  HENT:  Negative for ear discharge, ear pain, hearing loss and tinnitus.   Eyes:  Negative for photophobia and pain.  Respiratory:  Negative for cough and shortness of breath.   Cardiovascular:  Negative for chest pain.  Gastrointestinal:  Negative for abdominal pain, nausea and vomiting.  Genitourinary:  Negative for dysuria, flank pain, frequency and urgency.  Musculoskeletal:  Negative for arthralgias, myalgias and neck pain.  Neurological:  Negative for dizziness and headaches.  Hematological:  Does not bruise/bleed easily.  Psychiatric/Behavioral:  The patient is not nervous/anxious.    Blood pressure (!) 150/72, pulse 71, temperature 98.4 F (36.9 C), temperature source Oral, resp. rate 18, height 5' 2 (1.575 m), weight 100.7 kg, last menstrual period 11/20/2012, SpO2 100%. Physical Exam Constitutional:      General: She is not in acute distress.    Appearance: She is well-developed. She is not diaphoretic.  HENT:     Head: Normocephalic and atraumatic.  Eyes:     General: No scleral icterus.       Right eye: No discharge.        Left eye:  No discharge.  Conjunctiva/sclera: Conjunctivae normal.  Cardiovascular:     Rate and Rhythm: Normal rate and regular rhythm.  Pulmonary:     Effort: Pulmonary effort is normal. No respiratory distress.  Musculoskeletal:     Cervical back: Normal range of motion.     Comments: RLE No traumatic wounds, ecchymosis, or rash  Nontender  No knee or ankle effusion  Knee stable to varus/ valgus and anterior/posterior stress  Sens DPN, TN intact, SPN somewhat paresthetic  Motor EHL, ext, flex, evers 5/5  DP 2+, PT 1+, 1+ NP edema  Skin:    General: Skin is warm and dry.  Neurological:     Mental Status: She is alert.  Psychiatric:        Mood and Affect: Mood normal.        Behavior: Behavior normal.     Assessment/Plan: Right ankle dysfunction -- This sounds more like a nervous dysfunction rather that a structural problem with her foot/ankle. I do not think the tendinopathies seen on MRI would account for her symptoms. Given she does not have pain would not advise any specific treatment for them. She may f/u with Dr. Edna as needed.    Ozell DOROTHA Ned, PA-C Orthopedic Surgery [phone] 08/13/2024, 10:25 AM

## 2024-08-13 NOTE — TOC Progression Note (Signed)
 Transition of [facility]) - Progression Note    Patient Details  Name: [name] MRN: [id number] Date of Birth: 08/29/1961  Transition of Care [facility]) CM/SW Contact  [name], [name] Phone Number: 08/13/2024, 9:35 AM  Clinical Narrative:   No response yet on expedited appeal request. CSW attempted to reach individual working on patient's appeal that left a voicemail over the weekend, left a message asking for a call back. Awaiting response. CSW to follow.    Expected Discharge Plan: IP Rehab Facility Barriers to Discharge: Continued Medical Work up, Air Traffic [employer] and Services   Discharge Planning Services: CM Consult Post Acute Care Choice: IP Rehab Living arrangements for the past 2 months: Single Family Home                                       Social Drivers of Health (SDOH) Interventions SDOH Screenings   Food Insecurity: No Food Insecurity (07/30/2024)  Housing: Low Risk  (07/30/2024)  Transportation Needs: No Transportation Needs (07/30/2024)  Utilities: Not At Risk (07/30/2024)  Depression (PHQ2-9): Low Risk  (01/12/2024)  Tobacco Use: Medium Risk (06/29/2024)    Readmission Risk Interventions    05/17/2022    3:02 PM  Readmission Risk Prevention Plan  Transportation Screening Complete  PCP or Specialist Appt within 5-7 Days Complete  Home Care Screening Complete  Medication Review (RN CM) Complete

## 2024-08-13 NOTE — Progress Notes (Signed)
 Patient ID: [name], female   DOB: 26-Jun-1961, 63 y.o.   MRN: [id number] BP 138/71 (BP Location: Left Arm)   Pulse 69   Temp 98.2 F (36.8 C) (Oral)   Resp 20   Ht 5' 2 (1.575 m)   Wt 100.7 kg   LMP 11/20/2012   SpO2 95%   BMI 40.60 kg/m  Events of the day noted.  No neuro changes Seems like next week until we hear something from [facility], appreciate the time spent on the appeal

## 2024-08-13 NOTE — TOC Progression Note (Signed)
 Transition of Care [facility]) - Progression Note    Patient Details  Name: [name] MRN: [id number] Date of Birth: 07/06/1961  Transition of Care [facility]) CM/SW Contact  [name], [name] Phone Number: 08/13/2024, 12:11 PM  Clinical Narrative:   CSW contacted [facility] Appeals again, as no response was received yesterday. CSW spoke with Lesha. Lengthy phone call to attempt to locate decision on the appeal. Judye indicated that the appeal was upheld, but she could not provide any documentation; she said the documentation was already sent to the facility, but could not provide to which facility the documentation was sent. CSW has received no documentation, and there is no documentation in the online portal. CSW also contacted [facility], they have also not received any documentation on the appeal decision. After multiple times being placed on hold, Lesha advised CSW to fax the appeal request in another time. Information documented on fax sheet and request sent in again. CSW met with patient to provide update, she is appreciative of CSW assistance. CSW to follow.    Expected Discharge Plan: IP Rehab Facility Barriers to Discharge: Continued Medical Work up, Air Traffic Controller and Services   Discharge Planning Services: CM Consult Post Acute Care Choice: IP Rehab Living arrangements for the past 2 months: Single Family Home                                       Social Drivers of Health (SDOH) Interventions SDOH Screenings   Food Insecurity: No Food Insecurity (07/30/2024)  Housing: Low Risk  (07/30/2024)  Transportation Needs: No Transportation Needs (07/30/2024)  Utilities: Not At Risk (07/30/2024)  Depression (PHQ2-9): Low Risk  (01/12/2024)  Tobacco Use: Medium Risk (06/29/2024)    Readmission Risk Interventions    05/17/2022    3:02 PM  Readmission Risk Prevention Plan  Transportation Screening Complete  PCP or  Specialist Appt within 5-7 Days Complete  Home Care Screening Complete  Medication Review (RN CM) Complete

## 2024-08-13 NOTE — Plan of Care (Signed)

## 2024-08-13 NOTE — Progress Notes (Signed)
 Physical Therapy Treatment Patient Details Name: [name] MRN: [id number] DOB: June 22, 1961 Today's Date: 08/13/2024   History of Present Illness Pt is a 63 y/o F admitted for thoracic stenosis at T9/10 and LE weakness, now s/p thoracic decompression with neurosurgery on 06/29/24. Of note, pt had T9-10 laminectomy on 01/08/24 and was admitted to [facility] for rehab. PMHx: GERD, anxiety, HTN, fibromyalgia, hyperthyroidism, IBS, ACDF 3/4, posterior lateral arthrodesis 6/16, CSF leak 6/24.    PT Comments  Pt received in supine and agreeable to session. Pt able to progress gait distance with chair follow for safety. Pt continues to rely heavily on BUE support, but demonstrates improved pace and fluidity. Pt demonstrates good motivation to progress mobility. Pt continues to benefit from PT services to progress toward functional mobility goals.     If plan is discharge home, recommend the following: A lot of help with walking and/or transfers;A lot of help with bathing/dressing/bathroom;Assist for transportation;Help with stairs or ramp for entrance   Can travel by private vehicle     No  Equipment Recommendations  Hospital bed;Hoyer lift    Recommendations for Other Services       Precautions / Restrictions Precautions Precautions: Back;Fall Recall of Precautions/Restrictions: Intact Precaution/Restrictions Comments: R AFO; wound vac d/c'd 11/5 Restrictions Weight Bearing Restrictions Per Provider Order: No     Mobility  Bed Mobility Overal bed mobility: Needs Assistance Bed Mobility: Rolling, Sidelying to Sit Rolling: Contact guard assist Sidelying to sit: Min assist, HOB elevated, Used rails       General bed mobility comments: assist for RLE advancement to EOB. Increased time/effort    Transfers Overall transfer level: Needs assistance Equipment used: Rolling walker (2 wheels) Transfers: Sit to/from Stand Sit to Stand: Mod assist, From elevated surface           General  transfer comment: From slightly elevated EOB with mod A for power up.    Ambulation/Gait Ambulation/Gait assistance: Contact guard assist Gait Distance (Feet): 48 Feet Assistive device: Rolling walker (2 wheels) Gait Pattern/deviations: Decreased stride length, Decreased step length - left, Trunk flexed, Decreased stance time - right, Step-through pattern Gait velocity: Dec     General Gait Details: Slow steps with significant reliant on BUE for offloading and support and increased difficulty advancing RLE. 1 standing rest break due to fatigue.   Stairs             Wheelchair Mobility     Tilt Bed    Modified Rankin (Stroke Patients Only)       Balance Overall balance assessment: Needs assistance Sitting-balance support: Bilateral upper extremity supported, Feet supported, No upper extremity supported Sitting balance-[name]: Fair Sitting balance - Comments: sitting EOB with pt leaning posteriorly onto BUE intermittently   Standing balance support: Reliant on assistive device for balance, During functional activity, Bilateral upper extremity supported Standing balance-[name]: Poor Standing balance comment: reliant on RW support                            Communication Communication Communication: No apparent difficulties  Cognition Arousal: Alert Behavior During Therapy: WFL for tasks assessed/performed   PT - Cognitive impairments: No apparent impairments                         Following commands: Intact      Cueing Cueing Techniques: Verbal cues, Gestural cues  Exercises      General  Comments        Pertinent Vitals/Pain Pain Assessment Pain Assessment: 0-10 Pain Score: 10-Worst pain ever Pain Location: back, generalized Pain Descriptors / Indicators: Discomfort, Grimacing Pain Intervention(s): Monitored during session, RN gave pain meds during session, Repositioned     PT Goals (current goals can now be found  in the care plan section) Acute Rehab PT Goals Patient Stated Goal: to get more therapy PT Goal Formulation: With patient Time For Goal Achievement: 08/23/24 Progress towards PT goals: Progressing toward goals    Frequency    Min 2X/week       AM-PAC PT 6 Clicks Mobility   Outcome Measure  Help needed turning from your back to your side while in a flat bed without using bedrails?: A Little Help needed moving from lying on your back to sitting on the side of a flat bed without using bedrails?: A Little Help needed moving to and from a bed to a chair (including a wheelchair)?: A Lot Help needed standing up from a chair using your arms (e.g., wheelchair or bedside chair)?: A Lot Help needed to walk in hospital room?: A Little Help needed climbing 3-5 steps with a railing? : Total 6 Click Score: 14    End of Session Equipment Utilized During Treatment: Gait belt;Other (comment) (R AFO, compression socks) Activity Tolerance: Patient tolerated treatment well Patient left: in chair;with call bell/phone within reach Nurse Communication: Mobility status PT Visit Diagnosis: Unsteadiness on feet (R26.81);Other abnormalities of gait and mobility (R26.89);Muscle weakness (generalized) (M62.81)     Time: 1501-1540 PT Time Calculation (min) (ACUTE ONLY): 39 min  Charges:    $Gait Training: 23-37 mins $Therapeutic Activity: 8-22 mins PT General Charges $$ ACUTE PT VISIT: 1 Visit                     Darryle George, PTA Acute Rehabilitation Services Secure Chat Preferred  [phone]    Darryle George 08/13/2024, 3:49 PM

## 2024-08-14 NOTE — Progress Notes (Signed)
 No new issues or problems.  Pain under good control.  Lower extremity function somewhat improved still with right sided foot drop with some eversion.  Bowel and bladder function stable.  Afebrile.  Awake and alert.  Oriented and appropriate.  Motor examination with 4+/5 strength in her left lower extremity.  4/5 strength in her lower extremity.  Dressing clean and dry.  Status post revision thoracic laminectomy.  Continue current efforts mobilization.  No new recs

## 2024-08-14 NOTE — Progress Notes (Signed)
 Assessment 63 y/o F w/ hx long segment thoracolumbar construct who is s/p T9-10 lami/disc  LOS: 45 days    Plan: Await placement Supportive cares    Subjective: Still c/o right ankle issues  Objective: Vital signs in last 24 hours: Temp:  [97.4 F (36.3 C)-98.8 F (37.1 C)] 98 F (36.7 C) (11/09 0736) Pulse Rate:  [61-74] 70 (11/09 0736) Resp:  [16-18] 18 (11/09 0736) BP: (106-144)/(44-79) 131/75 (11/09 0736) SpO2:  [96 %-100 %] 98 % (11/09 0736)  Intake/Output from previous day: 11/08 0701 - 11/09 0700 In: -  Out: 900 [Urine:900] Intake/Output this shift: No intake/output data recorded.  Exam: Awake, alert MAE's well   Lab Results: No results for input(s): WBC, HGB, HCT, PLT in the last 72 hours.  BMET No results for input(s): NA, K, CL, CO2, GLUCOSE, BUN, CREATININE, CALCIUM  in the last 72 hours.   Studies/Results: No results found.     [name] 08/15/2024, 7:58 AM

## 2024-08-16 NOTE — Plan of Care (Signed)
  Problem: Activity: Goal: Risk for activity intolerance will decrease 08/16/2024 0319 by Cymone Yeske C, RN Outcome: Progressing 08/16/2024 0318 by Louvella Greig BROCKS, RN Outcome: Progressing   Problem: Coping: Goal: Level of anxiety will decrease 08/16/2024 0319 by Adiana Smelcer C, RN Outcome: Progressing 08/16/2024 0318 by Louvella Greig BROCKS, RN Outcome: Progressing   Problem: Pain Managment: Goal: General experience of comfort will improve and/or be controlled 08/16/2024 0319 by Louvella Greig BROCKS, RN Outcome: Progressing 08/16/2024 0318 by Louvella Greig BROCKS, RN Outcome: Progressing

## 2024-08-16 NOTE — Progress Notes (Signed)
 Patient ID: [name], female   DOB: 1961/05/23, 63 y.o.   MRN: [id number] BP (!) 116/58 (BP Location: Left Arm)   Pulse 73   Temp 98.3 F (36.8 C)   Resp 18   Ht 5' 2 (1.575 m)   Wt 100.7 kg   LMP 11/20/2012   SpO2 97%   BMI 40.60 kg/m  No neurological changes.  No word from [facility].  Continue with PT

## 2024-08-16 NOTE — Progress Notes (Signed)
 Occupational Therapy Treatment Patient Details Name: [name] MRN: [id number] DOB: [birth year] Today's Date: 08/16/2024   History of present illness Pt is a 63 y/o F admitted for thoracic stenosis at T9/10 and LE weakness, now s/p thoracic decompression with neurosurgery on 06/29/24. Of note, pt had T9-10 laminectomy on 01/08/24 and was admitted to [facility] for rehab. PMHx: GERD, anxiety, HTN, fibromyalgia, hyperthyroidism, IBS, ACDF 3/4, posterior lateral arthrodesis 6/16, CSF leak 6/24.   OT comments  Patient received in supine and agreeable to work with OT with am care. Patient declined getting OOB to recliner due to patient stating it's too low to get out of and declined wheelchair stating she was going to lie back down after self care. Patient demonstrating good gains with bathing requiring assistance for below knees for bathing and patient able to perform peri area bathing with lateral leaning. Patient will benefit from intensive inpatient follow-up therapy, >3 hours/day.  Acute OT to continue to follow to address established goals to facilitate DC to next venue of care.        If plan is discharge home, recommend the following:  Two people to help with walking and/or transfers;Assist for transportation;Help with stairs or ramp for entrance;Assistance with cooking/housework;A lot of help with bathing/dressing/bathroom   Equipment Recommendations  Other (comment) (defer)    Recommendations for Other Services      Precautions / Restrictions Precautions Precautions: Back;Fall Recall of Precautions/Restrictions: Intact Precaution/Restrictions Comments: R AFO; wound vac d/c'd 11/5       Mobility Bed Mobility Overal bed mobility: Needs Assistance Bed Mobility: Rolling, Sidelying to Sit, Sit to Supine Rolling: Contact guard assist Sidelying to sit: Min assist, HOB elevated, Used rails   Sit to supine: Mod assist, HOB elevated, Used rails   General bed mobility comments: assistance  with trunk to get to EOB and assistance with BLEs to get back to supine    Transfers Overall transfer level: Needs assistance                 General transfer comment: declined standing     Balance Overall balance assessment: Needs assistance Sitting-balance support: Bilateral upper extremity supported, Feet supported, No upper extremity supported Sitting balance-[name]: Fair Sitting balance - Comments: able to perform self care tasks seated on EOB                                   ADL either performed or assessed with clinical judgement   ADL Overall ADL's : Needs assistance/impaired     Grooming: Wash/dry hands;Wash/dry face;Oral care;Set up;Sitting Grooming Details (indicate cue type and reason): on EOB Upper Body Bathing: Set up;Sitting Upper Body Bathing Details (indicate cue type and reason): on EOB Lower Body Bathing: Moderate assistance;Sitting/lateral leans Lower Body Bathing Details (indicate cue type and reason): patient bathed peri area with lateral leaning and required assistance to bathe feet Upper Body Dressing : Set up;Sitting Upper Body Dressing Details (indicate cue type and reason): for gown change Lower Body Dressing: Maximal assistance;Sitting/lateral leans Lower Body Dressing Details (indicate cue type and reason): for compression stockings               General ADL Comments: declined self care at sink asked to perform from [facility]    Extremity/Trunk Assessment              Vision       Perception  Praxis     Communication Communication Communication: No apparent difficulties   Cognition Arousal: Alert Behavior During Therapy: WFL for tasks assessed/performed Cognition: No apparent impairments                               Following commands: Intact        Cueing   Cueing Techniques: Verbal cues, Gestural cues  Exercises      Shoulder Instructions       General Comments       Pertinent Vitals/ Pain       Pain Assessment Pain Assessment: Faces Faces Pain Scale: Hurts little more Pain Location: back, generalized Pain Descriptors / Indicators: Discomfort, Grimacing Pain Intervention(s): Limited activity within patient's tolerance, Monitored during session, Repositioned, Premedicated before session  Home Living                                          Prior Functioning/Environment              Frequency  Min 2X/week        Progress Toward Goals  OT Goals(current goals can now be found in the care plan section)  Progress towards OT goals: Progressing toward goals  Acute Rehab OT Goals Patient Stated Goal: to go to [facility] OT Goal Formulation: With patient Time For Goal Achievement: 08/24/24 Potential to Achieve Goals: Fair ADL Goals Pt Will Perform Grooming: with contact guard assist;standing (with assist of lift equipment PRN) Pt Will Perform Upper Body Dressing: with modified independence;sitting Pt Will Perform Lower Body Dressing: with contact guard assist;with adaptive equipment;sitting/lateral leans Pt Will Transfer to Toilet: with min assist;ambulating Additional ADL Goal #1: Pt will recall spine precautions 100% of the time during OT sessions for improved safety. Additional ADL Goal #2: pt will static stand with no more than one UE support ~1 minute to facilitate functional participation in BADL  Plan      Co-evaluation                 AM-PAC OT 6 Clicks Daily Activity     Outcome Measure   Help from another person eating meals?: None Help from another person taking care of personal grooming?: A Little Help from another person toileting, which includes using toliet, bedpan, or urinal?: A Lot Help from another person bathing (including washing, rinsing, drying)?: A Lot Help from another person to put on and taking off regular upper body clothing?: A Little Help from another person to put on and taking off  regular lower body clothing?: A Lot 6 Click Score: 16    End of Session    OT Visit Diagnosis: Unsteadiness on feet (R26.81);Muscle weakness (generalized) (M62.81);Pain Pain - part of body:  (back)   Activity Tolerance Patient tolerated treatment well   Patient Left in bed;with call bell/phone within reach;with bed alarm set   Nurse Communication Mobility status        Time: 0728-0827 OT Time Calculation (min): 59 min  Charges: OT General Charges $OT Visit: 1 Visit OT Treatments $Self Care/Home Management : 53-67 mins  Dick Laine, OTA Acute Rehabilitation Services  Office [phone]   Jeb LITTIE Laine 08/16/2024, 11:27 AM

## 2024-08-17 NOTE — Progress Notes (Signed)
 Physical Therapy Treatment Patient Details Name: [name] MRN: [id number] DOB: Feb 10, 1961 Today's Date: 08/17/2024   History of Present Illness Pt is a 63 y/o F admitted for thoracic stenosis at T9/10 and LE weakness, now s/p thoracic decompression with neurosurgery on 06/29/24. Of note, pt had T9-10 laminectomy on 01/08/24 and was admitted to [facility] for rehab. PMHx: GERD, anxiety, HTN, fibromyalgia, hyperthyroidism, IBS, ACDF 3/4, posterior lateral arthrodesis 6/16, CSF leak 6/24.    PT Comments  Pt received in supine and agreeable to session. Session focused on transfer and gait training with pt demonstrating improved activity tolerance. Pt able to perform 4 standing trials from EOB and recliner with pt able to progress to mod A with cues for technique. Pt continues to demonstrate difficulty transitioning hands to RW, but demonstrates slightly improved power up. Pt able to slightly increase gait distance with heavy reliance on BUE support. Pt encouraged to take other opportunities for OOB and transfer practice with staff, such as transferring to the recliner and BSC. Pt continues to benefit from PT services to progress toward functional mobility goals.    If plan is discharge home, recommend the following: A lot of help with walking and/or transfers;A lot of help with bathing/dressing/bathroom;Assist for transportation;Help with stairs or ramp for entrance   Can travel by private vehicle     No  Equipment Recommendations  Hospital bed;Hoyer lift    Recommendations for Other Services       Precautions / Restrictions Precautions Precautions: Back;Fall Precaution/Restrictions Comments: R AFO; wound vac d/c'd 11/5 Restrictions Weight Bearing Restrictions Per Provider Order: No     Mobility  Bed Mobility Overal bed mobility: Needs Assistance Bed Mobility: Rolling, Sidelying to Sit Rolling: Contact guard assist Sidelying to sit: Min assist, HOB elevated, Used rails       General  bed mobility comments: heavy reliance on bed features and assist for BLE advancement to EOB    Transfers Overall transfer level: Needs assistance Equipment used: Rolling walker (2 wheels) Transfers: Sit to/from Stand Sit to Stand: Mod assist, From elevated surface           General transfer comment: STS from EOB x2 and recliner x2 with cues for technique and hand placement. Assist for power up and anterior weight shift. improved technique from recliner with mod A, but still requires increased time for rise and difficulty transitioning hands to RW    Ambulation/Gait Ambulation/Gait assistance: Contact guard assist, +2 safety/equipment Gait Distance (Feet): 52 Feet Assistive device: Rolling walker (2 wheels) Gait Pattern/deviations: Decreased stride length, Decreased step length - left, Trunk flexed, Decreased stance time - right, Step-through pattern Gait velocity: Dec     General Gait Details: Slow steps with significant reliant on BUE for offloading and support and increased difficulty advancing RLE. standing rest breaks due to fatigue. Cues for technique with less reliance on BUE to reduce fatigue.   Stairs             Wheelchair Mobility     Tilt Bed    Modified Rankin (Stroke Patients Only)       Balance Overall balance assessment: Needs assistance Sitting-balance support: Bilateral upper extremity supported, Feet supported, No upper extremity supported Sitting balance-[name]: Fair Sitting balance - Comments: sitting EOB with intermittent posterior lean onto BUE   Standing balance support: Reliant on assistive device for balance, During functional activity, Bilateral upper extremity supported Standing balance-[name]: Poor Standing balance comment: heavy reliance on RW support  Communication Communication Communication: No apparent difficulties  Cognition Arousal: Alert Behavior During Therapy: WFL for tasks  assessed/performed   PT - Cognitive impairments: No apparent impairments                         Following commands: Intact      Cueing Cueing Techniques: Verbal cues, Gestural cues  Exercises      General Comments General comments (skin integrity, edema, etc.): Dressing on back incision noted to be rolled up, RN notified      Pertinent Vitals/Pain Pain Assessment Pain Assessment: Faces Faces Pain Scale: Hurts a little bit Pain Location: RLE Pain Descriptors / Indicators: Discomfort, Grimacing Pain Intervention(s): Monitored during session, Repositioned     PT Goals (current goals can now be found in the care plan section) Acute Rehab PT Goals Patient Stated Goal: to get more therapy PT Goal Formulation: With patient Time For Goal Achievement: 08/23/24 Progress towards PT goals: Progressing toward goals    Frequency    Min 2X/week       AM-PAC PT 6 Clicks Mobility   Outcome Measure  Help needed turning from your back to your side while in a flat bed without using bedrails?: A Little Help needed moving from lying on your back to sitting on the side of a flat bed without using bedrails?: A Little Help needed moving to and from a bed to a chair (including a wheelchair)?: A Lot Help needed standing up from a chair using your arms (e.g., wheelchair or bedside chair)?: A Lot Help needed to walk in hospital room?: A Little Help needed climbing 3-5 steps with a railing? : Total 6 Click Score: 14    End of Session Equipment Utilized During Treatment: Gait belt;Other (comment) (R AFO, compression socks) Activity Tolerance: Patient tolerated treatment well Patient left: in chair;with call bell/phone within reach Nurse Communication: Mobility status PT Visit Diagnosis: Unsteadiness on feet (R26.81);Other abnormalities of gait and mobility (R26.89);Muscle weakness (generalized) (M62.81)     Time: 8876-8786 PT Time Calculation (min) (ACUTE ONLY): 50  min  Charges:    $Gait Training: 8-22 mins $Therapeutic Activity: 23-37 mins PT General Charges $$ ACUTE PT VISIT: 1 Visit                     [name], PTA Acute Rehabilitation Services Secure Chat Preferred  [phone]    [name] 08/17/2024, 1:30 PM

## 2024-08-17 NOTE — TOC Progression Note (Signed)
 Transition of Care [name]) - Progression Note    Patient Details  Name: [name] MRN: [id number] Date of Birth: 10-Feb-1961  [facility] CM/SW Contact  [name], [name] Phone Number: 08/17/2024, 10:00 AM  Clinical Narrative:   CSW following for disposition. Still no word from [facility] on appeal, they have 72 hours to respond from request. CSW to follow.    Expected Discharge Plan: IP Rehab Facility Barriers to Discharge: Continued Medical Work up, [employer] and Services   Discharge Planning Services: CM Consult Post Acute Care Choice: IP Rehab Living arrangements for the past 2 months: Single Family Home                                       Social Drivers of Health (SDOH) Interventions SDOH Screenings   Food Insecurity: No Food Insecurity (07/30/2024)  Housing: Low Risk  (07/30/2024)  Transportation Needs: No Transportation Needs (07/30/2024)  Utilities: Not At Risk (07/30/2024)  Depression (PHQ2-9): Low Risk  (01/12/2024)  Tobacco Use: Medium Risk (06/29/2024)    Readmission Risk Interventions    05/17/2022    3:02 PM  Readmission Risk Prevention Plan  Transportation Screening Complete  PCP or Specialist Appt within 5-7 Days Complete  Home Care Screening Complete  Medication Review (RN CM) Complete

## 2024-08-17 NOTE — Progress Notes (Signed)
 Patient ID: [name], female   DOB: [birth year], 63 y.o.   MRN: [id number] BP 126/64 (BP Location: Left Arm)   Pulse 73   Temp 99.2 F (37.3 C) (Oral)   Resp 17   Ht 5' 2 (1.575 m)   Wt 100.7 kg   LMP 11/20/2012   SpO2 92%   BMI 40.60 kg/m  Alert and oriented x 4 speech is clear and fluent.  Moving all extremities  Continue to work with the therapies Continue to wait on [facility] appeal.

## 2024-08-18 NOTE — Progress Notes (Signed)
 Patient ID: [name], female   DOB: 1961-05-10, 63 y.o.   MRN: [id number] BP (!) 110/49 (BP Location: Left Arm)   Pulse 82   Temp 98.2 F (36.8 C) (Oral)   Resp 17   Ht 5' 2 (1.575 m)   Wt 100.7 kg   LMP 11/20/2012   SpO2 94%   BMI 40.60 kg/m  Alert, states she is tired, and has been all day.  Moving all extremities Waiting on placement

## 2024-08-18 NOTE — Progress Notes (Signed)
 PT Cancellation Note  Patient Details Name: [name] MRN: [id number] DOB: 1961-05-07   Cancelled Treatment:    Reason Eval/Treat Not Completed: (P) Patient declined, no reason specified (Pt reports increased fatigue and pain today and declines mobility at this time. Will continue to follow per PT POC.)   Darryle George 08/18/2024, 3:48 PM

## 2024-08-19 NOTE — Progress Notes (Signed)
 Physical Therapy Treatment Patient Details Name: [name] MRN: [id number] DOB: 1961/02/03 Today's Date: 08/19/2024   History of Present Illness Pt is a 62 y/o F admitted for thoracic stenosis at T9/10 and LE weakness, now s/p thoracic decompression with neurosurgery on 06/29/24. Of note, pt had T9-10 laminectomy on 01/08/24 and was admitted to [facility] for rehab. PMHx: GERD, anxiety, HTN, fibromyalgia, hyperthyroidism, IBS, ACDF 3/4, posterior lateral arthrodesis 6/16, CSF leak 6/24.    PT Comments  Pt received in supine and agreeable to session.  Pt continues to require increased time to complete mobility tasks due to fatigue and pain. Pt is consistently demonstrating improved activity tolerance with transfer and gait training. Pt able to tolerate 2 therapy session today with ambulation in the hallway. Pt demonstrates improved power up and hand transition during standing transfers with cues this session. Pt continues to demonstrate RLE weakness requiring assist during bed mobility and positioning for transfers. Pt continues to benefit from PT services to progress toward functional mobility goals.    If plan is discharge home, recommend the following: A lot of help with walking and/or transfers;A lot of help with bathing/dressing/bathroom;Assist for transportation;Help with stairs or ramp for entrance   Can travel by private vehicle     No  Equipment Recommendations  Hospital bed;Hoyer lift    Recommendations for Other Services       Precautions / Restrictions Precautions Precautions: Back;Fall Recall of Precautions/Restrictions: Intact Precaution/Restrictions Comments: R AFO; wound vac d/c'd 11/5 Restrictions Weight Bearing Restrictions Per Provider Order: No     Mobility  Bed Mobility Overal bed mobility: Needs Assistance Bed Mobility: Rolling, Sidelying to Sit, Sit to Supine Rolling: Used rails, Min assist Sidelying to sit: Min assist, HOB elevated, Used rails   Sit to  supine: Mod assist, HOB elevated, Used rails   General bed mobility comments: increased time and heavy use of bedrail. Assist for BLE advancement to EOB and elevation back to EOB    Transfers Overall transfer level: Needs assistance Equipment used: Rolling walker (2 wheels) Transfers: Sit to/from Stand, Bed to chair/wheelchair/BSC Sit to Stand: Mod assist, From elevated surface   Step pivot transfers: Contact guard assist       General transfer comment: mod assist to stand from EOB x2 and wheelchair. Cues for technique with pt demonstrating improved hand transition to RW    Ambulation/Gait Ambulation/Gait assistance: Contact guard assist, +2 safety/equipment Gait Distance (Feet): 52 Feet Assistive device: Rolling walker (2 wheels) Gait Pattern/deviations: Decreased stride length, Decreased step length - left, Trunk flexed, Decreased stance time - right, Step-through pattern Gait velocity: Dec     General Gait Details: Slow steps with significant reliant on BUE for offloading and support and increased difficulty advancing RLE. Lower R foot clearance with increased fatigue   Stairs             Wheelchair Mobility     Tilt Bed    Modified Rankin (Stroke Patients Only)       Balance Overall balance assessment: Needs assistance Sitting-balance support: Bilateral upper extremity supported, Feet supported, No upper extremity supported Sitting balance-[name]: Fair Sitting balance - Comments: posterior lean onto BUE for support   Standing balance support: Reliant on assistive device for balance, During functional activity, Bilateral upper extremity supported Standing balance-[name]: Poor Standing balance comment: heavy reliance on RW support  Communication Communication Communication: No apparent difficulties  Cognition Arousal: Alert Behavior During Therapy: WFL for tasks assessed/performed   PT - Cognitive  impairments: No apparent impairments                         Following commands: Intact      Cueing Cueing Techniques: Verbal cues, Gestural cues  Exercises      General Comments        Pertinent Vitals/Pain Pain Assessment Pain Assessment: Faces Faces Pain Scale: Hurts little more Pain Location: back Pain Descriptors / Indicators: Discomfort, Grimacing Pain Intervention(s): Monitored during session, Repositioned     PT Goals (current goals can now be found in the care plan section) Acute Rehab PT Goals Patient Stated Goal: to get more therapy PT Goal Formulation: With patient Time For Goal Achievement: 08/23/24 Progress towards PT goals: Progressing toward goals    Frequency    Min 2X/week       AM-PAC PT 6 Clicks Mobility   Outcome Measure  Help needed turning from your back to your side while in a flat bed without using bedrails?: A Little Help needed moving from lying on your back to sitting on the side of a flat bed without using bedrails?: A Little Help needed moving to and from a bed to a chair (including a wheelchair)?: A Lot Help needed standing up from a chair using your arms (e.g., wheelchair or bedside chair)?: A Lot Help needed to walk in hospital room?: A Little Help needed climbing 3-5 steps with a railing? : Total 6 Click Score: 14    End of Session Equipment Utilized During Treatment: Gait belt;Other (comment) (R AFO, compression socks) Activity Tolerance: Patient tolerated treatment well Patient left: with call bell/phone within reach;in bed;with bed alarm set Nurse Communication: Mobility status PT Visit Diagnosis: Unsteadiness on feet (R26.81);Other abnormalities of gait and mobility (R26.89);Muscle weakness (generalized) (M62.81)     Time: 1423-1510 PT Time Calculation (min) (ACUTE ONLY): 47 min  Charges:    $Gait Training: 8-22 mins $Therapeutic Activity: 23-37 mins PT General Charges $$ ACUTE PT VISIT: 1 Visit                     Darryle George, PTA Acute Rehabilitation Services Secure Chat Preferred  [phone]    Darryle George 08/19/2024, 3:49 PM

## 2024-08-19 NOTE — Progress Notes (Signed)
 Patient ID: [name], female   DOB: 27-Feb-1961, 63 y.o.   MRN: [id number] BP 117/66 (BP Location: Left Arm)   Pulse (!) 101   Temp 98.4 F (36.9 C) (Oral)   Resp 18   Ht 5' 2 (1.575 m)   Wt 100.7 kg   LMP 11/20/2012   SpO2 93%   BMI 40.60 kg/m  Ms. Boeder was able to speak with her orthopaedist today. He does not feel there is an ankle problem.  Was up twice today per PT, continues to work

## 2024-08-19 NOTE — Plan of Care (Signed)

## 2024-08-19 NOTE — Progress Notes (Signed)
 Occupational Therapy Treatment Patient Details Name: [name] MRN: [id number] DOB: [birth year] Today's Date: 08/19/2024   History of present illness Pt is a 63 y/o F admitted for thoracic stenosis at T9/10 and LE weakness, now s/p thoracic decompression with neurosurgery on 06/29/24. Of note, pt had T9-10 laminectomy on 01/08/24 and was admitted to [facility] for rehab. PMHx: GERD, anxiety, HTN, fibromyalgia, hyperthyroidism, IBS, ACDF 3/4, posterior lateral arthrodesis 6/16, CSF leak 6/24.   OT comments  Patient received in supine and agreeable to OT treatment. Patient declined going to wheelchair to perform self care at sink and performed bathing and dressing seated on EOB with lateral leaning for peri area bathing. Patient stood from EOB to side step toward [facility] after self care and patient declined she would like to walk in hallway. Patient able to ambulate in hallway with mod assist to stand from raised bed and min assist to steady. Patient returned to supine at end of session.  Patient will benefit from intensive inpatient follow-up therapy, >3 hours/day.  Acute OT to continue to follow to address established goals to facilitate DC to next venue of care.        If plan is discharge home, recommend the following:  Assist for transportation;Help with stairs or ramp for entrance;Assistance with cooking/housework;A lot of help with bathing/dressing/bathroom;A lot of help with walking and/or transfers   Equipment Recommendations  Other (comment) (defer)    Recommendations for Other Services      Precautions / Restrictions Precautions Precautions: Back;Fall Recall of Precautions/Restrictions: Intact Precaution/Restrictions Comments: R AFO; wound vac d/c'd 11/5 Restrictions Weight Bearing Restrictions Per Provider Order: No       Mobility Bed Mobility Overal bed mobility: Needs Assistance Bed Mobility: Rolling, Sidelying to Sit, Sit to Supine Rolling: Contact guard assist, Used  rails Sidelying to sit: Min assist, HOB elevated, Used rails Supine to sit: Used rails Sit to supine: Mod assist, HOB elevated, Used rails   General bed mobility comments: increased time and use of bed features with assistance for trunk to get to EOB and assistance with BLE to return to supine    Transfers Overall transfer level: Needs assistance Equipment used: Rolling walker (2 wheels) Transfers: Sit to/from Stand Sit to Stand: Mod assist, From elevated surface           General transfer comment: mod assist to stand from EOB and wheelchair, min assist to steady when up with RW     Balance Overall balance assessment: Needs assistance Sitting-balance support: Bilateral upper extremity supported, Feet supported, No upper extremity supported Sitting balance-[name]: Fair Sitting balance - Comments: able to perform self care tasks seated on EOB with no LOB   Standing balance support: Reliant on assistive device for balance, During functional activity, Bilateral upper extremity supported Standing balance-[name]: Poor Standing balance comment: heavy reliance on RW support                           ADL either performed or assessed with clinical judgement   ADL Overall ADL's : Needs assistance/impaired     Grooming: Wash/dry hands;Wash/dry face;Set up;Sitting Grooming Details (indicate cue type and reason): on EOB Upper Body Bathing: Minimal assistance;Sitting Upper Body Bathing Details (indicate cue type and reason): assistance for back seated on EOB Lower Body Bathing: Moderate assistance;Sitting/lateral leans Lower Body Bathing Details (indicate cue type and reason): patient bathed peri area with lateral leaning and requires assistance to bathe feet Upper Body  Dressing : Set up;Sitting Upper Body Dressing Details (indicate cue type and reason): for gown change Lower Body Dressing: Maximal assistance;Sitting/lateral leans Lower Body Dressing Details  (indicate cue type and reason): to donn footwear             Functional mobility during ADLs: Minimal assistance;Rolling walker (2 wheels)      Extremity/Trunk Assessment              Vision       Perception     Praxis     Communication Communication Communication: No apparent difficulties   Cognition Arousal: Alert Behavior During Therapy: WFL for tasks assessed/performed Cognition: No apparent impairments                               Following commands: Intact        Cueing   Cueing Techniques: Verbal cues, Gestural cues  Exercises      Shoulder Instructions       General Comments      Pertinent Vitals/ Pain       Pain Assessment Pain Assessment: Faces Faces Pain Scale: Hurts a little bit Pain Location: RLE Pain Descriptors / Indicators: Discomfort, Grimacing Pain Intervention(s): Limited activity within patient's tolerance, Monitored during session, Repositioned  Home Living                                          Prior Functioning/Environment              Frequency  Min 2X/week        Progress Toward Goals  OT Goals(current goals can now be found in the care plan section)  Progress towards OT goals: Progressing toward goals  Acute Rehab OT Goals Patient Stated Goal: to go to rehab OT Goal Formulation: With patient Time For Goal Achievement: 08/24/24 Potential to Achieve Goals: Fair ADL Goals Pt Will Perform Grooming: with contact guard assist;standing (with assist of lift equipment PRN) Pt Will Perform Upper Body Dressing: with modified independence;sitting Pt Will Perform Lower Body Dressing: with contact guard assist;with adaptive equipment;sitting/lateral leans Pt Will Transfer to Toilet: with min assist;ambulating Additional ADL Goal #1: Pt will recall spine precautions 100% of the time during OT sessions for improved safety. Additional ADL Goal #2: pt will static stand with no more than  one UE support ~1 minute to facilitate functional participation in BADL  Plan      Co-evaluation                 AM-PAC OT 6 Clicks Daily Activity     Outcome Measure   Help from another person eating meals?: None Help from another person taking care of personal grooming?: A Little Help from another person toileting, which includes using toliet, bedpan, or urinal?: A Lot Help from another person bathing (including washing, rinsing, drying)?: A Lot Help from another person to put on and taking off regular upper body clothing?: A Little Help from another person to put on and taking off regular lower body clothing?: A Lot 6 Click Score: 16    End of Session Equipment Utilized During Treatment: Gait belt;Rolling walker (2 wheels)  OT Visit Diagnosis: Unsteadiness on feet (R26.81);Muscle weakness (generalized) (M62.81);Pain Pain - part of body:  (back)   Activity Tolerance Patient tolerated treatment well   Patient Left in  bed;with call bell/phone within reach;with bed alarm set   Nurse Communication Mobility status        Time: 9257-9165 OT Time Calculation (min): 52 min  Charges: OT General Charges $OT Visit: 1 Visit OT Treatments $Self Care/Home Management : 23-37 mins $Therapeutic Activity: 8-22 mins  Dick [name], OTA Acute Rehabilitation Services  Office [phone]   [name] 08/19/2024, 12:25 PM

## 2024-08-20 NOTE — Plan of Care (Signed)
  Problem: Education: Goal: Knowledge of General Education information will improve Description: Including pain rating scale, medication(s)/side effects and non-pharmacologic comfort measures Outcome: Progressing   Problem: Clinical Measurements: Goal: Ability to maintain clinical measurements within normal limits will improve Outcome: Progressing Goal: Will remain free from infection Outcome: Progressing   Problem: Activity: Goal: Risk for activity intolerance will decrease Outcome: Progressing   Problem: Nutrition: Goal: Adequate nutrition will be maintained Outcome: Progressing   Problem: Coping: Goal: Level of anxiety will decrease Outcome: Progressing   Problem: Pain Managment: Goal: General experience of comfort will improve and/or be controlled Outcome: Progressing   Problem: Safety: Goal: Ability to remain free from injury will improve Outcome: Progressing   Problem: Skin Integrity: Goal: Risk for impaired skin integrity will decrease Outcome: Progressing

## 2024-08-20 NOTE — Progress Notes (Signed)
 Patient ID: [name], female   DOB: 06-06-1961, 63 y.o.   MRN: [id number] BP 137/73 (BP Location: Left Arm)   Pulse 75   Temp 98.4 F (36.9 C) (Oral)   Resp 18   Ht 5' 2 (1.575 m)   Wt 100.7 kg   LMP 11/20/2012   SpO2 95%   BMI 40.60 kg/m  Alert and oriented x 4, speech is clear and fluent Moving all extremities Waiting on placement

## 2024-08-20 NOTE — Plan of Care (Signed)

## 2024-08-20 NOTE — TOC Progression Note (Addendum)
 Transition of Care [facility]) - Progression Note    Patient Details  Name: [name] MRN: [id number] Date of Birth: [birth year]  Transition of [facility]) CM/SW Contact  [name], [name] Phone Number: 08/20/2024, 2:32 PM  Clinical Narrative:   CSW noting in [facility] internet portal that the appeal is upheld, although no documentation is attached. CSW met with patient to provide update that insurance will not approve AIR and another disposition needs to be determined. Patient frustrated with determination, but indicated understanding. Lengthy discussion with patient on options available, and she is continuing to adamantly oppose SNF placement. CSW discussed with patient about home, she is in agreement to home health, but does not want to use [facility]. Patient has a RW, 3N1, and shower seat, but she would prefer a tub bench, if possible. Order placed for tub bench. Patient concerned about stepping up onto a step to get into her home, message sent to PT to address it when they work with her. CSW to follow.  UPDATE: CSW called back to the room by patient's request, she had [employer] on the phone again to discuss the appeal. Per representative on the phone, they will send in the appeal again so that we can receive the documentation for why it was denied. CSW confirmed with representative on the phone that it would go into the portal. [employer] has 72 hours to respond. Patient had received tub bench and confirmed that it would work in her tub at home. CSW to follow.    Expected Discharge Plan: Home w Home Health Services Barriers to Discharge: Continued Medical Work up               Expected Discharge Plan and Services   Discharge Planning Services: CM Consult Post Acute Care Choice: IP Rehab Living arrangements for the past 2 months: Single Family Home                                       Social Drivers of Health (SDOH) Interventions SDOH Screenings   Food Insecurity: No Food  Insecurity (07/30/2024)  Housing: Low Risk  (07/30/2024)  Transportation Needs: No Transportation Needs (07/30/2024)  Utilities: Not At Risk (07/30/2024)  Depression (PHQ2-9): Low Risk  (01/12/2024)  Tobacco Use: Medium Risk (06/29/2024)    Readmission Risk Interventions    05/17/2022    3:02 PM  Readmission Risk Prevention Plan  Transportation Screening Complete  PCP or Specialist Appt within 5-7 Days Complete  Home Care Screening Complete  Medication Review (RN CM) Complete

## 2024-08-21 NOTE — Progress Notes (Signed)
 Physical Therapy Treatment Patient Details Name: [name] MRN: [id number] DOB: September 10, 1961 Today's Date: 08/21/2024   History of Present Illness Pt is a 63 y/o F admitted for thoracic stenosis at T9/10 and LE weakness, now s/p thoracic decompression with neurosurgery on 06/29/24. Of note, pt had T9-10 laminectomy on 01/08/24 and was admitted to [facility] for rehab. PMHx: GERD, anxiety, HTN, fibromyalgia, hyperthyroidism, IBS, ACDF 3/4, posterior lateral arthrodesis 6/16, CSF leak 6/24.    PT Comments  Pt received in supine, eager to participate and with fair tolerance for transfer and pre-gait training at bedside. Worked on instruction on preparing for stepping up stairs for home, pt limited due to RLE poor motor coordination when stepping vs R hip/knee weakness (tendency to R hip ER and some R knee hyperextension when stepping) and despite R knee blocked, pt unable to tap LLE on 7 step or flex hip enough to clear 5 step. Pt would benefit from further attempts at foot taps/step-up as per chart review, pt having difficulty getting authorization for higher intensity rehab facility placement, although she would benefit from this prior to return home with only +1 assist/HH services. Practiced log rolling/bed mobility from flat bed and pt needing up to maxA to sit EOB while maintaining no twisting precs and up to minA for rolling to L/R for assist to place her legs properly to assist with sequencing/avoid twisting her spine. Pt requesting MD further assess her R hip/knee in case she needs imaging of soft tissues, as pt reports increased R knee/hip pain and instability after ambulating with therapies earlier in the week, RN notified. Pt will to consider R hinged knee brace to see if this helps with stability with stepping, if needed, esp for gait/stair negotiation. Patient will benefit from intensive inpatient follow-up therapy, >3 hours/day, she is making slow progress toward goals due to limited time  available for therapies while she is in acute portion of her hospital stay.    If plan is discharge home, recommend the following: A lot of help with bathing/dressing/bathroom;Assist for transportation;Help with stairs or ramp for entrance;Two people to help with walking and/or transfers;Assistance with cooking/housework   Can travel by private vehicle     No  Equipment Recommendations  Hospital bed;Hoyer lift    Recommendations for Other Services Rehab consult     Precautions / Restrictions Precautions Precautions: Back;Fall;Other (comment) Precaution Booklet Issued: Yes (comment) Recall of Precautions/Restrictions:  (needs reminders to avoid twisting when rolling to L/R sides 11/15) Precaution/Restrictions Comments: R AFO; tendency to ER at R hip when stepping and R knee hyperext, RN notified in case she needs a hinged knee brace for stability when stepping/ambulating. Restrictions Weight Bearing Restrictions Per Provider Order: No     Mobility  Bed Mobility Overal bed mobility: Needs Assistance Bed Mobility: Rolling, Sidelying to Sit, Sit to Sidelying Rolling: Contact guard assist, Used rails, Min assist Sidelying to sit: Max assist     Sit to sidelying: Mod assist, Used rails General bed mobility comments: cues for log rolling, needs assist to position BLE prior to rolling as she tends to twist her back when reaching arms cross-body toward opposite rail prior to moving her LE when rolling. Practiced bed mobility from flat bed without rails to sit on her L EOB but needing maxA and mod cues for log roll technique without bed features. Pt would benefit from hospital bed for home if she does not get CIR placement.    Transfers Overall transfer level: Needs assistance Equipment used:  Rolling walker (2 wheels) Transfers: Sit to/from Stand, Bed to chair/wheelchair/BSC Sit to Stand: Mod assist, From elevated surface, Max assist   Step pivot transfers: Contact guard assist, Min  assist, From elevated surface       General transfer comment: maxA to stand from only slightly elevated bed surface>RW initially, modA for additional trials x2 and up to minA for sidesteps toward L side due to pt R hip ER and R knee instability/tendency to R knee hyperextension at times. Pt may benefit from R hip/knee soft tissue imaging to assess for ligamentous injury as she reports she has decreased sensation of pain at her joints and it feels unstable when she is moving.    Ambulation/Gait Ambulation/Gait assistance: Min assist   Assistive device: Rolling walker (2 wheels)       Pre-gait activities: standing hip flexion at RW and sidesteps toward [facility] ~55ft, pt defers gait trial away from EOB after multiple transfers and attempts at tapping L foot on step multiple reps due to back pain/leg pain/fatigue.     Stairs Stairs:  (Attempted LLE foot taps on 7 step but pt unable even with BUE support and R knee blocked to prevent buckling or hyperextension. Pt may need to trial R KI type brace or hinged knee brace for added RLE stability when performing step ups with LLE.)           Wheelchair Mobility     Tilt Bed    Modified Rankin (Stroke Patients Only)       Balance Overall balance assessment: Needs assistance Sitting-balance support: Bilateral upper extremity supported, Feet supported, No upper extremity supported Sitting balance-[name]: Fair Sitting balance - Comments: posterior lean and heavy reliance on BUE support when performing dynamic seated tasks (LAQ and lateral leans) Postural control: Posterior lean Standing balance support: Reliant on assistive device for balance, During functional activity, Bilateral upper extremity supported Standing balance-[name]: Poor Standing balance comment: Very heavy reliance on RW support                            Communication Communication Communication: No apparent difficulties  Cognition Arousal:  Alert Behavior During Therapy: WFL for tasks assessed/performed   PT - Cognitive impairments: No apparent impairments                       PT - Cognition Comments: Pt needs occasional reminders to avoid twisting when rolling to L/R sides in bed. Following commands: Intact      Cueing Cueing Techniques: Verbal cues, Gestural cues, Tactile cues  Exercises Other Exercises Other Exercises: seated BLE AROM: LAQ x10 reps ea Other Exercises: standing BLE AROM: hip flexion x5 reps x2 sets standing at RW Other Exercises: attempting LLE foot taps on 7 step x5 trials, pt unable to get hip flexed enough to tap foot on step, with BUE support of RW and R knee blocked. PTA reviewed with pt plan to use soft mat in room folded once or twice next session for slightly lower surface to tap foot on, pt too fatigued to perform more reps after return to sitting EOB and pre-gait tasks. Other Exercises: STS x 3 reps for strengthening    General Comments General comments (skin integrity, edema, etc.): SpO2/HR WFL on RA per finger sensor Dressing over mid-back area is lifting up at bottom and lower side edges, PTA attempting to push dressing down but it may need to be reinforced,  RN notified.     Pertinent Vitals/Pain Pain Assessment Pain Assessment: 0-10 Pain Score: 10-Worst pain ever Faces Pain Scale: Hurts little more Breathing: occasional labored breathing, short period of hyperventilation Negative Vocalization: none Facial Expression: smiling or inexpressive Body Language: relaxed Consolability: distracted or reassured by voice/touch PAINAD Score: 2 Facial Expression: Tense Vocalization (extubated pts.): Talking in normal tone or no sound Pain Location: generalized pain from my fibromyalgia and RLE knee/back, pt reports 10/10 but HR WFL and no significant dypsnea or tachypnea with postural changes when sitting/supine Pain Descriptors / Indicators: Discomfort, Grimacing, Guarding Pain  Intervention(s): Limited activity within patient's tolerance, Monitored during session, Repositioned, Patient requesting pain meds-RN notified    Home Living                          Prior Function            PT Goals (current goals can now be found in the care plan section) Acute Rehab PT Goals Patient Stated Goal: to get R knee instability addressed so I can ambulate on my own at home safely. PT Goal Formulation: With patient Time For Goal Achievement: 08/23/24 Progress towards PT goals: Progressing toward goals    Frequency    Min 2X/week (may need to be increased if pt has to go home with +1 assist)      PT Plan      Co-evaluation              AM-PAC PT 6 Clicks Mobility   Outcome Measure  Help needed turning from your back to your side while in a flat bed without using bedrails?: A Little Help needed moving from lying on your back to sitting on the side of a flat bed without using bedrails?: A Lot (from flat bed/no rail) Help needed moving to and from a bed to a chair (including a wheelchair)?: A Lot Help needed standing up from a chair using your arms (e.g., wheelchair or bedside chair)?: A Lot Help needed to walk in hospital room?: Total (<60ft) Help needed climbing 3-5 steps with a railing? : Total 6 Click Score: 11    End of Session Equipment Utilized During Treatment: Gait belt;Other (comment) (RLE AFO) Activity Tolerance: Patient tolerated treatment well;Patient limited by fatigue;Patient limited by pain Patient left: in bed;with call bell/phone within reach;with bed alarm set;Other (comment) (bil heels floated, HOB >30 deg) Nurse Communication: Mobility status;Patient requests pain meds;Other (comment) (pt reporting subjective R knee instability and requesting MD look at it and consider soft tissue imaging if indicated) PT Visit Diagnosis: Unsteadiness on feet (R26.81);Other abnormalities of gait and mobility (R26.89);Muscle weakness  (generalized) (M62.81)     Time: 8676-8587 PT Time Calculation (min) (ACUTE ONLY): 49 min  Charges:    $Therapeutic Exercise: 8-22 mins $Therapeutic Activity: 23-37 mins PT General Charges $$ ACUTE PT VISIT: 1 Visit                     Amri Lien P., PTA Acute Rehabilitation Services Secure Chat Preferred 9a-5:30pm Office: [phone]    Connell HERO [facility] 08/21/2024, 2:38 PM

## 2024-08-21 NOTE — Plan of Care (Signed)
  Problem: Education: Goal: Knowledge of General Education information will improve Description: Including pain rating scale, medication(s)/side effects and non-pharmacologic comfort measures Outcome: Progressing   Problem: Clinical Measurements: Goal: Ability to maintain clinical measurements within normal limits will improve Outcome: Progressing Goal: Will remain free from infection Outcome: Progressing   Problem: Activity: Goal: Risk for activity intolerance will decrease Outcome: Progressing   Problem: Nutrition: Goal: Adequate nutrition will be maintained Outcome: Progressing   Problem: Coping: Goal: Level of anxiety will decrease Outcome: Progressing   Problem: Pain Managment: Goal: General experience of comfort will improve and/or be controlled Outcome: Progressing   Problem: Safety: Goal: Ability to remain free from injury will improve Outcome: Progressing   Problem: Activity: Goal: Ability to avoid complications of mobility impairment will improve Outcome: Progressing

## 2024-08-21 NOTE — Progress Notes (Signed)
 Patient ID: [name], female   DOB: 07-Oct-1961, 63 y.o.   MRN: [id number] Seen and examined.  Exam unchanged.  She can move her legs in bed.  She is playing solitaire on her phone.  She is pleasant and conversant.  She describes no new needs at this time.  Continue current management and await placement

## 2024-08-21 NOTE — Plan of Care (Signed)
   Problem: Health Behavior/Discharge Planning: Goal: Ability to manage health-related needs will improve Outcome: Progressing   Problem: Clinical Measurements: Goal: Will remain free from infection Outcome: Progressing

## 2024-08-22 NOTE — Plan of Care (Signed)

## 2024-08-23 ENCOUNTER — Telehealth (HOSPITAL_COMMUNITY): Payer: Self-pay | Admitting: Pharmacy Technician

## 2024-08-23 ENCOUNTER — Other Ambulatory Visit (HOSPITAL_COMMUNITY): Payer: Self-pay

## 2024-08-23 NOTE — Progress Notes (Signed)
 Occupational Therapy Treatment Patient Details Name: [name] MRN: [id number] DOB: 27-May-1961 Today's Date: 08/23/2024   History of present illness Pt is a 63 y/o F admitted for thoracic stenosis at T9/10 and LE weakness, now s/p thoracic decompression with neurosurgery on 06/29/24. Of note, pt had T9-10 laminectomy on 01/08/24 and was admitted to [facility] for rehab. PMHx: GERD, anxiety, HTN, fibromyalgia, hyperthyroidism, IBS, ACDF 3/4, posterior lateral arthrodesis 6/16, CSF leak 6/24.   OT comments  Patient continues to perform bathing and dressing seated on EOB with min assist for UB and mod assist for LB bathing with patient performing lateral leans for peri area bathing. Patient asking to walk after self care with mod assist to stand from EOB and max assist from wheelchair due to lower surface. Patient will benefit from intensive inpatient follow-up therapy, >3 hours/day.  Acute OT to continue to follow to address established goals to facilitate DC to next venue of care.        If plan is discharge home, recommend the following:  Assist for transportation;Help with stairs or ramp for entrance;Assistance with cooking/housework;A lot of help with bathing/dressing/bathroom;A lot of help with walking and/or transfers   Equipment Recommendations  Other (comment) (defer)    Recommendations for Other Services      Precautions / Restrictions Precautions Precautions: Back;Fall;Other (comment) Precaution Booklet Issued: Yes (comment) Recall of Precautions/Restrictions:  (cues for back precautions) Precaution/Restrictions Comments: R AFO; tendency to ER at R hip when stepping and R knee hyperext, RN notified in case she needs a hinged knee brace for stability when stepping/ambulating. Restrictions Weight Bearing Restrictions Per Provider Order: No       Mobility Bed Mobility Overal bed mobility: Needs Assistance Bed Mobility: Rolling, Sidelying to Sit Rolling: Contact guard assist, Used  rails, Min assist Sidelying to sit: Mod assist       General bed mobility comments: cues for back precautions with bed mobility, left on EOB for breakfast    Transfers Overall transfer level: Needs assistance Equipment used: Rolling walker (2 wheels) Transfers: Sit to/from Stand, Bed to chair/wheelchair/BSC Sit to Stand: Mod assist, From elevated surface, Max assist           General transfer comment: mod assist to stand from raised bed and max assist from wheelchair with cues for hand placement     Balance Overall balance assessment: Needs assistance Sitting-balance support: Bilateral upper extremity supported, Feet supported, No upper extremity supported Sitting balance-[name]: Fair Sitting balance - Comments: seated EOB for self care and breakffast   Standing balance support: Reliant on assistive device for balance, During functional activity, Bilateral upper extremity supported Standing balance-[name]: Poor Standing balance comment: Very heavy reliance on RW support                           ADL either performed or assessed with clinical judgement   ADL Overall ADL's : Needs assistance/impaired Eating/Feeding: Set up;Sitting Eating/Feeding Details (indicate cue type and reason): on EOB Grooming: Wash/dry hands;Wash/dry face;Sitting;Oral care;Set up Grooming Details (indicate cue type and reason): on EOB Upper Body Bathing: Minimal assistance;Sitting Upper Body Bathing Details (indicate cue type and reason): assistance for back seated on EOB Lower Body Bathing: Moderate assistance;Sitting/lateral leans Lower Body Bathing Details (indicate cue type and reason): patient bathed peri area with lateral leaning and requires assistance to bathe feet Upper Body Dressing : Set up;Sitting Upper Body Dressing Details (indicate cue type and reason): for gown  change Lower Body Dressing: Maximal assistance;Sitting/lateral leans                       Extremity/Trunk Assessment              Vision       Perception     Praxis     Communication Communication Communication: No apparent difficulties   Cognition Arousal: Alert Behavior During Therapy: WFL for tasks assessed/performed Cognition: No apparent impairments                               Following commands: Intact        Cueing   Cueing Techniques: Verbal cues, Gestural cues, Tactile cues  Exercises      Shoulder Instructions       General Comments      Pertinent Vitals/ Pain       Pain Assessment Pain Assessment: Faces Faces Pain Scale: Hurts little more Pain Location: back Pain Descriptors / Indicators: Discomfort, Grimacing, Guarding Pain Intervention(s): Limited activity within patient's tolerance, Monitored during session, Repositioned  Home Living                                          Prior Functioning/Environment              Frequency  Min 2X/week        Progress Toward Goals  OT Goals(current goals can now be found in the care plan section)  Progress towards OT goals: Progressing toward goals  Acute Rehab OT Goals Patient Stated Goal: to go home once she can do steps OT Goal Formulation: With patient Time For Goal Achievement: 08/24/24 Potential to Achieve Goals: Fair ADL Goals Pt Will Perform Grooming: with contact guard assist;standing (wotj assist of lift equipment PRN) Pt Will Perform Upper Body Dressing: with modified independence;sitting Pt Will Perform Lower Body Dressing: with contact guard assist;with adaptive equipment;sitting/lateral leans Pt Will Transfer to Toilet: with min assist;ambulating Additional ADL Goal #1: Pt will recall spine precautions 100% of the time during OT sessions for improved safety. Additional ADL Goal #2: pt will static stand with no more than one UE support ~1 minute to facilitate functional participation in BADL  Plan      Co-evaluation                  AM-PAC OT 6 Clicks Daily Activity     Outcome Measure   Help from another person eating meals?: None Help from another person taking care of personal grooming?: A Little Help from another person toileting, which includes using toliet, bedpan, or urinal?: A Lot Help from another person bathing (including washing, rinsing, drying)?: A Lot Help from another person to put on and taking off regular upper body clothing?: A Little Help from another person to put on and taking off regular lower body clothing?: A Lot 6 Click Score: 16    End of Session Equipment Utilized During Treatment: Gait belt;Rolling walker (2 wheels);Other (comment) (wheelchair)  OT Visit Diagnosis: Unsteadiness on feet (R26.81);Muscle weakness (generalized) (M62.81);Pain Pain - part of body:  (back)   Activity Tolerance Patient tolerated treatment well   Patient Left in bed;with call bell/phone within reach;with bed alarm set   Nurse Communication Mobility status        Time: [phone] OT  Time Calculation (min): 61 min  Charges: OT General Charges $OT Visit: 1 Visit OT Treatments $Self Care/Home Management : 38-52 mins $Therapeutic Activity: 8-22 mins  Dick Laine, OTA Acute Rehabilitation Services  Office [phone]   Jeb LITTIE Laine 08/23/2024, 9:11 AM

## 2024-08-23 NOTE — Progress Notes (Signed)
 Physical Therapy Treatment Patient Details Name: [name] MRN: [id number] DOB: 1961-08-21 Today's Date: 08/23/2024   History of Present Illness Pt is a 63 y/o F admitted for thoracic stenosis at T9/10 and LE weakness, now s/p thoracic decompression with neurosurgery on 06/29/24. Of note, pt had T9-10 laminectomy on 01/08/24 and was admitted to [facility] for rehab. PMHx: GERD, anxiety, HTN, fibromyalgia, hyperthyroidism, IBS, ACDF 3/4, posterior lateral arthrodesis 6/16, CSF leak 6/24.    PT Comments  Patient progressing with ability to achieve 4 long term goals with goals updated this session.  She walked twice today with OT then PT and was able to increase distance.  She continues to struggle with R knee instability though not sure bracing is the option as she struggles to progress the leg with weight of the AFO already.  PT will continue to follow during the acute stay.  Would benefit from post-acute inpatient rehab for maximizing independence and safety prior to d/c home.   If plan is discharge home, recommend the following: A lot of help with bathing/dressing/bathroom;Assist for transportation;Help with stairs or ramp for entrance;Two people to help with walking and/or transfers;Assistance with cooking/housework   Can travel by private vehicle        Equipment Recommendations  Hospital bed    Recommendations for Other Services       Precautions / Restrictions Precautions Precautions: Back;Fall;Other (comment) Precaution/Restrictions Comments: R AFO Required Braces or Orthoses:  (no brace needed)     Mobility  Bed Mobility Overal bed mobility: Needs Assistance Bed Mobility: Rolling, Sidelying to Sit Rolling: Used rails, Min assist Sidelying to sit: Min assist, Used rails     Sit to sidelying: Mod assist, Used rails General bed mobility comments: cues for back precautions with bed mobility, with both hands to L rail pt twisting to assist to turn hips then for R leg off EOB pt  able to pull herself up; to sidelying assist for both legs onto bed then assist to reposition trunk    Transfers Overall transfer level: Needs assistance Equipment used: Rolling walker (2 wheels) Transfers: Sit to/from Stand Sit to Stand: Mod assist, From elevated surface           General transfer comment: increase time    Ambulation/Gait Ambulation/Gait assistance: Min assist, +2 safety/equipment (chair follow) Gait Distance (Feet): 65 Feet Assistive device: Rolling walker (2 wheels) Gait Pattern/deviations: Step-to pattern, Trunk flexed, Decreased stance time - right, Decreased step length - left       General Gait Details: difficulty wiht R LE advancement, stops to rest x 3 in standing   Stairs             Wheelchair Mobility     Tilt Bed    Modified Rankin (Stroke Patients Only)       Balance Overall balance assessment: Needs assistance Sitting-balance support: Feet supported Sitting balance-[name]: Good Sitting balance - Comments: assist on EOB for donning shoes/AFO   Standing balance support: Bilateral upper extremity supported Standing balance-[name]: Poor Standing balance comment: Very heavy reliance on RW support                            Communication Communication Communication: No apparent difficulties  Cognition Arousal: Alert Behavior During Therapy: WFL for tasks assessed/performed   PT - Cognitive impairments: No apparent impairments  PT - Cognition Comments: reminders not to twist with bed mobility Following commands: Intact      Cueing Cueing Techniques: Verbal cues  Exercises      General Comments General comments (skin integrity, edema, etc.): Replaced purewick      Pertinent Vitals/Pain Pain Assessment Pain Assessment: Faces Faces Pain Scale: Hurts even more Pain Location: back, generalized Pain Descriptors / Indicators: Discomfort, Grimacing, Guarding Pain  Intervention(s): Monitored during session, Repositioned, Patient requesting pain meds-RN notified    Home Living                          Prior Function            PT Goals (current goals can now be found in the care plan section) Acute Rehab PT Goals Patient Stated Goal: get better and go home PT Goal Formulation: With patient Time For Goal Achievement: 09/06/24 Progress towards PT goals: Progressing toward goals;Goals updated    Frequency    Min 2X/week      PT Plan      Co-evaluation              AM-PAC PT 6 Clicks Mobility   Outcome Measure  Help needed turning from your back to your side while in a flat bed without using bedrails?: A Little Help needed moving from lying on your back to sitting on the side of a flat bed without using bedrails?: A Lot Help needed moving to and from a bed to a chair (including a wheelchair)?: A Little Help needed standing up from a chair using your arms (e.g., wheelchair or bedside chair)?: A Lot Help needed to walk in hospital room?: A Lot Help needed climbing 3-5 steps with a railing? : Total 6 Click Score: 13    End of Session Equipment Utilized During Treatment: Gait belt (R AFO) Activity Tolerance: Patient tolerated treatment well Patient left: in bed;with call bell/phone within reach;with bed alarm set   PT Visit Diagnosis: Unsteadiness on feet (R26.81);Other abnormalities of gait and mobility (R26.89);Muscle weakness (generalized) (M62.81)     Time: [id number] PT Time Calculation (min) (ACUTE ONLY): 39 min  Charges:    $Gait Training: 23-37 mins $Therapeutic Activity: 8-22 mins PT General Charges $$ ACUTE PT VISIT: 1 Visit                     Micheline Portal, PT Acute Rehabilitation Services [phone] 08/23/2024    Montie Portal 08/23/2024, 5:28 PM

## 2024-08-23 NOTE — Telephone Encounter (Signed)
 Patient Product/process Development Scientist completed.    The patient is insured through [facility]. Patient has Medicare and is not eligible for a copay card, but may be able to apply for patient assistance or Medicare RX Payment Plan (Patient Must reach out to their plan, if eligible for payment plan), if available.    Ran test claim for Xarelto 10 mg and the current 30 day co-pay is $0.00.   This test claim was processed through [facility]- copay amounts may vary at other pharmacies due to pharmacy/plan contracts, or as the patient moves through the different stages of their insurance plan.     Reyes Sharps, CPHT Pharmacy Technician Patient Advocate Specialist Lead [facility] Patient Advocate Team Direct Number: [phone]  Fax: [phone]

## 2024-08-23 NOTE — Plan of Care (Signed)
 Problem: Education: Goal: Knowledge of General Education information will improve Description: Including pain rating scale, medication(s)/side effects and non-pharmacologic comfort measures 08/23/2024 0550 by Vicci Delon PARAS, RN Outcome: Progressing 08/23/2024 0550 by Vicci Delon PARAS, RN Outcome: Progressing   Problem: Health Behavior/Discharge Planning: Goal: Ability to manage health-related needs will improve 08/23/2024 0550 by Vicci Delon PARAS, RN Outcome: Progressing 08/23/2024 0550 by Vicci Delon PARAS, RN Outcome: Progressing   Problem: Clinical Measurements: Goal: Ability to maintain clinical measurements within normal limits will improve 08/23/2024 0550 by Vicci Delon PARAS, RN Outcome: Progressing 08/23/2024 0550 by Vicci Delon PARAS, RN Outcome: Progressing Goal: Will remain free from infection 08/23/2024 0550 by Vicci Delon PARAS, RN Outcome: Progressing 08/23/2024 0550 by Vicci Delon PARAS, RN Outcome: Progressing Goal: Diagnostic test results will improve 08/23/2024 0550 by Vicci Delon PARAS, RN Outcome: Progressing 08/23/2024 0550 by Vicci Delon PARAS, RN Outcome: Progressing Goal: Respiratory complications will improve 08/23/2024 0550 by Vicci Delon PARAS, RN Outcome: Progressing 08/23/2024 0550 by Vicci Delon PARAS, RN Outcome: Progressing Goal: Cardiovascular complication will be avoided 08/23/2024 0550 by Vicci Delon PARAS, RN Outcome: Progressing 08/23/2024 0550 by Vicci Delon PARAS, RN Outcome: Progressing   Problem: Activity: Goal: Risk for activity intolerance will decrease 08/23/2024 0550 by Vicci Delon PARAS, RN Outcome: Progressing 08/23/2024 0550 by Vicci Delon PARAS, RN Outcome: Progressing   Problem: Nutrition: Goal: Adequate nutrition will be maintained 08/23/2024 0550 by Vicci Delon PARAS, RN Outcome: Progressing 08/23/2024 0550 by Vicci Delon PARAS, RN Outcome: Progressing   Problem:  Coping: Goal: Level of anxiety will decrease 08/23/2024 0550 by Vicci Delon PARAS, RN Outcome: Progressing 08/23/2024 0550 by Vicci Delon PARAS, RN Outcome: Progressing   Problem: Elimination: Goal: Will not experience complications related to bowel motility 08/23/2024 0550 by Vicci Delon PARAS, RN Outcome: Progressing 08/23/2024 0550 by Vicci Delon PARAS, RN Outcome: Progressing Goal: Will not experience complications related to urinary retention 08/23/2024 0550 by Vicci Delon PARAS, RN Outcome: Progressing 08/23/2024 0550 by Vicci Delon PARAS, RN Outcome: Progressing   Problem: Pain Managment: Goal: General experience of comfort will improve and/or be controlled 08/23/2024 0550 by Vicci Delon PARAS, RN Outcome: Progressing 08/23/2024 0550 by Vicci Delon PARAS, RN Outcome: Progressing   Problem: Safety: Goal: Ability to remain free from injury will improve 08/23/2024 0550 by Vicci Delon PARAS, RN Outcome: Progressing 08/23/2024 0550 by Vicci Delon PARAS, RN Outcome: Progressing   Problem: Skin Integrity: Goal: Risk for impaired skin integrity will decrease 08/23/2024 0550 by Vicci Delon PARAS, RN Outcome: Progressing 08/23/2024 0550 by Vicci Delon PARAS, RN Outcome: Progressing   Problem: Education: Goal: Ability to verbalize activity precautions or restrictions will improve 08/23/2024 0550 by Vicci Delon PARAS, RN Outcome: Progressing 08/23/2024 0550 by Vicci Delon PARAS, RN Outcome: Progressing Goal: Knowledge of the prescribed therapeutic regimen will improve 08/23/2024 0550 by Vicci Delon PARAS, RN Outcome: Progressing 08/23/2024 0550 by Vicci Delon PARAS, RN Outcome: Progressing Goal: Understanding of discharge needs will improve 08/23/2024 0550 by Vicci Delon PARAS, RN Outcome: Progressing 08/23/2024 0550 by Vicci Delon PARAS, RN Outcome: Progressing   Problem: Activity: Goal: Ability to avoid complications of  mobility impairment will improve 08/23/2024 0550 by Vicci Delon PARAS, RN Outcome: Progressing 08/23/2024 0550 by Vicci Delon PARAS, RN Outcome: Progressing Goal: Ability to tolerate increased activity will improve 08/23/2024 0550 by Vicci Delon PARAS, RN Outcome: Progressing 08/23/2024 0550 by Vicci Delon PARAS, RN Outcome: Progressing Goal: Will remain free from falls 08/23/2024 0550 by Vicci Delon PARAS, RN Outcome: Progressing 08/23/2024 0550 by  Vicci Delon PARAS, RN Outcome: Progressing   Problem: Bowel/Gastric: Goal: Gastrointestinal status for postoperative course will improve 08/23/2024 0550 by Vicci Delon PARAS, RN Outcome: Progressing 08/23/2024 0550 by Vicci Delon PARAS, RN Outcome: Progressing   Problem: Clinical Measurements: Goal: Ability to maintain clinical measurements within normal limits will improve 08/23/2024 0550 by Vicci Delon PARAS, RN Outcome: Progressing 08/23/2024 0550 by Vicci Delon PARAS, RN Outcome: Progressing Goal: Postoperative complications will be avoided or minimized 08/23/2024 0550 by Vicci Delon PARAS, RN Outcome: Progressing 08/23/2024 0550 by Vicci Delon PARAS, RN Outcome: Progressing Goal: Diagnostic test results will improve 08/23/2024 0550 by Vicci Delon PARAS, RN Outcome: Progressing 08/23/2024 0550 by Vicci Delon PARAS, RN Outcome: Progressing   Problem: Pain Management: Goal: Pain level will decrease 08/23/2024 0550 by Vicci Delon PARAS, RN Outcome: Progressing 08/23/2024 0550 by Vicci Delon PARAS, RN Outcome: Progressing   Problem: Skin Integrity: Goal: Will show signs of wound healing 08/23/2024 0550 by Vicci Delon PARAS, RN Outcome: Progressing 08/23/2024 0550 by Vicci Delon PARAS, RN Outcome: Progressing   Problem: Health Behavior/Discharge Planning: Goal: Identification of resources available to assist in meeting health care needs will improve 08/23/2024 0550 by Vicci Delon PARAS, RN Outcome: Progressing 08/23/2024 0550 by Vicci Delon PARAS, RN Outcome: Progressing   Problem: Bladder/Genitourinary: Goal: Urinary functional status for postoperative course will improve 08/23/2024 0550 by Vicci Delon PARAS, RN Outcome: Progressing 08/23/2024 0550 by Vicci Delon PARAS, RN Outcome: Progressing

## 2024-08-23 NOTE — Progress Notes (Signed)
 Patient ID: [name], female   DOB: 1961-06-15, 63 y.o.   MRN: [id number] BP 120/64 (BP Location: Left Arm)   Pulse 73   Temp 98.8 F (37.1 C) (Oral)   Resp 18   Ht 5' 2 (1.575 m)   Wt 100.7 kg   LMP 11/20/2012   SpO2 93%   BMI 40.60 kg/m  Currently sleeping.  Had a very good day with PT. Ambulating more each day Coming up on discharge. Will ensure all is set up.

## 2024-08-24 NOTE — Progress Notes (Signed)
 Patient ID: [name], female   DOB: November 29, 1960, 63 y.o.   MRN: [id number] BP 136/73 (BP Location: Right Arm)   Pulse 76   Temp 98 F (36.7 C) (Oral)   Resp 18   Ht 5' 2 (1.575 m)   Wt 100.7 kg   LMP 11/20/2012   SpO2 94%   BMI 40.60 kg/m  Alert and oriented x 4, speech is clear and fluent Moving all extremities Another appeal to [facility] has been sent. Time for response is 72hrs Will speak to PT about right knee, and right hip

## 2024-08-24 NOTE — Progress Notes (Signed)
 Physical Therapy Treatment Patient Details Name: [name] MRN: [id number] DOB: Sep 14, 1961 Today's Date: 08/24/2024   History of Present Illness Pt is a 63 y/o F admitted for thoracic stenosis at T9/10 and LE weakness, now s/p thoracic decompression with neurosurgery on 06/29/24. Of note, pt had T9-10 laminectomy on 01/08/24 and was admitted to [facility] for rehab. PMHx: GERD, anxiety, HTN, fibromyalgia, hyperthyroidism, IBS, ACDF 3/4, posterior lateral arthrodesis 6/16, CSF leak 6/24.    PT Comments  Pt received in supine and agreeable to session. Pt continuing to report concerns about RLE weakness and instability. Instructed pt in supine adduction and abduction exercises for increased hip strength and stability. Pt continues to require grossly min-mod A for bed mobility, transfers, and ambulation due to weakness and decreased activity tolerance. Discussed options for safe discharge and educated on recommendation of post-acute rehab prior to return home for safety. Pt reports she is unsure of amount of assist available at home, but has one aide available. Pt also reports the ramp at home is not stable, so will need a new ramp or be able to navigate steps prior to return home. Pt continues to benefit from PT services to progress toward functional mobility goals.    If plan is discharge home, recommend the following: A lot of help with bathing/dressing/bathroom;Assist for transportation;Help with stairs or ramp for entrance;Two people to help with walking and/or transfers;Assistance with cooking/housework   Can travel by private vehicle     No  Equipment Recommendations  Hospital bed    Recommendations for Other Services       Precautions / Restrictions Precautions Precautions: Back;Fall;Other (comment) Precaution/Restrictions Comments: R AFO Restrictions Weight Bearing Restrictions Per Provider Order: No     Mobility  Bed Mobility Overal bed mobility: Needs Assistance Bed Mobility:  Rolling, Sidelying to Sit, Sit to Sidelying Rolling: Used rails, Mod assist Sidelying to sit: Mod assist, Used rails     Sit to sidelying: Mod assist, Used rails General bed mobility comments: Assist to flex R knee to roll and advance BLE to EOB and to scoot to EOB with bedpad. Assist to elevate BLE back to EOB    Transfers Overall transfer level: Needs assistance Equipment used: Rolling walker (2 wheels) Transfers: Sit to/from Stand Sit to Stand: Mod assist, From elevated surface           General transfer comment: From EOB and w/c with mod A for power up    Ambulation/Gait Ambulation/Gait assistance: Contact guard assist, +2 safety/equipment Gait Distance (Feet): 55 Feet Assistive device: Rolling walker (2 wheels) Gait Pattern/deviations: Step-to pattern, Trunk flexed, Decreased stance time - right, Decreased step length - left Gait velocity: Dec     General Gait Details: difficulty with R LE advancement. Multiple standing rest breaks due to BUE fatigue   Stairs             Wheelchair Mobility     Tilt Bed    Modified Rankin (Stroke Patients Only)       Balance Overall balance assessment: Needs assistance Sitting-balance support: Feet supported Sitting balance-[name]: Good     Standing balance support: Bilateral upper extremity supported, During functional activity, Reliant on assistive device for balance Standing balance-[name]: Poor Standing balance comment: Very heavy reliance on RW support                            Communication Communication Communication: No apparent difficulties  Cognition Arousal: Alert  Behavior During Therapy: WFL for tasks assessed/performed   PT - Cognitive impairments: No apparent impairments                         Following commands: Intact      Cueing Cueing Techniques: Verbal cues  Exercises General Exercises - Lower Extremity Hip ABduction/ADduction: Supine, 5 reps, AAROM,  Right Other Exercises Other Exercises: Adduction squeeze with pillow x5 reps with 5 sec hold    General Comments        Pertinent Vitals/Pain Pain Assessment Pain Assessment: 0-10 Pain Score: 10-Worst pain ever Pain Location: back Pain Descriptors / Indicators: Discomfort, Grimacing, Guarding Pain Intervention(s): Premedicated before session, Monitored during session, Limited activity within patient's tolerance, Repositioned     PT Goals (current goals can now be found in the care plan section) Acute Rehab PT Goals Patient Stated Goal: get better and go home PT Goal Formulation: With patient Time For Goal Achievement: 09/06/24 Progress towards PT goals: Progressing toward goals    Frequency    Min 2X/week       AM-PAC PT 6 Clicks Mobility   Outcome Measure  Help needed turning from your back to your side while in a flat bed without using bedrails?: A Little Help needed moving from lying on your back to sitting on the side of a flat bed without using bedrails?: A Lot Help needed moving to and from a bed to a chair (including a wheelchair)?: A Little Help needed standing up from a chair using your arms (e.g., wheelchair or bedside chair)?: A Lot Help needed to walk in hospital room?: A Lot Help needed climbing 3-5 steps with a railing? : Total 6 Click Score: 13    End of Session Equipment Utilized During Treatment: Gait belt (R AFO) Activity Tolerance: Patient tolerated treatment well Patient left: in bed;with call bell/phone within reach;with bed alarm set Nurse Communication: Mobility status PT Visit Diagnosis: Unsteadiness on feet (R26.81);Other abnormalities of gait and mobility (R26.89);Muscle weakness (generalized) (M62.81)     Time: 8688-8584 PT Time Calculation (min) (ACUTE ONLY): 64 min  Charges:    $Gait Training: 23-37 mins $Therapeutic Exercise: 8-22 mins $Therapeutic Activity: 8-22 mins PT General Charges $$ ACUTE PT VISIT: 1 Visit                     Darryle George, PTA Acute Rehabilitation Services Secure Chat Preferred  [phone]    Darryle George 08/24/2024, 2:44 PM

## 2024-08-24 NOTE — Plan of Care (Signed)

## 2024-08-24 NOTE — Plan of Care (Signed)
  Problem: Skin Integrity: Goal: Risk for impaired skin integrity will decrease Outcome: Progressing   Problem: Education: Goal: Ability to verbalize activity precautions or restrictions will improve Outcome: Progressing

## 2024-08-25 NOTE — Progress Notes (Signed)
 Patient ID: [name], female   DOB: 12/01/1960, 63 y.o.   MRN: [id number] BP (!) 123/58 (BP Location: Left Arm)   Pulse 75   Temp 97.6 F (36.4 C) (Oral)   Resp 18   Ht 5' 2 (1.575 m)   Wt 100.7 kg   LMP 11/20/2012   SpO2 92%   BMI 40.60 kg/m  Alert and oriented x 4 Speech is clear and fluent Up with PT today Working on placement

## 2024-08-25 NOTE — TOC Progression Note (Addendum)
 Transition of Care [facility]) - Progression Note    Patient Details  Name: [name] MRN: [id number] Date of Birth: Apr 03, 1961  Transition of Care [facility]) CM/SW Contact  [name], [name] Phone Number: 08/25/2024, 11:20 AM  Clinical Narrative:   CSW updated yesterday afternoon by PT that patient was agreeable to at least looking at a SNF list. CSW refaxed referral with patient's improvements, and met with patient to provide updated bed offers. Patient to look at options, and CSW to follow back up.   UPDATE: CSW met with patient, she is interested in [facility], will discuss with her son tomorrow (he is not available today due to work) and see if a friend can go and tour for her. She will also ask MD when he rounds. CSW to follow.    Expected Discharge Plan: Home w Home Health Services Barriers to Discharge: Continued Medical Work up               Expected Discharge Plan and Services   Discharge Planning Services: CM Consult Post Acute Care Choice: IP Rehab Living arrangements for the past 2 months: Single Family Home                                       Social Drivers of Health (SDOH) Interventions SDOH Screenings   Food Insecurity: No Food Insecurity (07/30/2024)  Housing: Low Risk  (07/30/2024)  Transportation Needs: No Transportation Needs (07/30/2024)  Utilities: Not At Risk (07/30/2024)  Depression (PHQ2-9): Low Risk  (01/12/2024)  Tobacco Use: Medium Risk (06/29/2024)    Readmission Risk Interventions    05/17/2022    3:02 PM  Readmission Risk Prevention Plan  Transportation Screening Complete  PCP or Specialist Appt within 5-7 Days Complete  Home Care Screening Complete  Medication Review (RN CM) Complete

## 2024-08-26 NOTE — TOC Progression Note (Signed)
 Transition of Care [facility]) - Progression Note    Patient Details  Name: [name] MRN: [id number] Date of Birth: [birth year]  Transition of Care [facility]) CM/SW Contact  [name], [name] Phone Number: 08/26/2024, 1:08 PM  Clinical Narrative:   CSW met with patient to discuss SNF. Patient had not had a chance to discuss with her son yet, but plans to today, and it also asking her friend this afternoon to go and tour the facility. Patient with concerns about her back wound, waiting on MD to round today to discuss if anything else needs to be done for that. CSW to follow back up tomorrow.    Expected Discharge Plan: Home w Home Health Services Barriers to Discharge: Continued Medical Work up               Expected Discharge Plan and Services   Discharge Planning Services: CM Consult Post Acute Care Choice: IP Rehab Living arrangements for the past 2 months: Single Family Home                                       Social Drivers of Health (SDOH) Interventions SDOH Screenings   Food Insecurity: No Food Insecurity (07/30/2024)  Housing: Low Risk  (07/30/2024)  Transportation Needs: No Transportation Needs (07/30/2024)  Utilities: Not At Risk (07/30/2024)  Depression (PHQ2-9): Low Risk  (01/12/2024)  Tobacco Use: Medium Risk (06/29/2024)    Readmission Risk Interventions    05/17/2022    3:02 PM  Readmission Risk Prevention Plan  Transportation Screening Complete  PCP or Specialist Appt within 5-7 Days Complete  Home Care Screening Complete  Medication Review (RN CM) Complete

## 2024-08-26 NOTE — Progress Notes (Signed)
 Patient ID: [name], female   DOB: 03/29/1961, 63 y.o.   MRN: [id number] BP 131/64 (BP Location: Left Arm)   Pulse (!) 47   Temp 98.2 F (36.8 C) (Oral)   Resp 18   Ht 5' 2 (1.575 m)   Wt 100.7 kg   LMP 11/20/2012   SpO2 91%   BMI 40.60 kg/m  Alert and oriented x 4 Wound with slight opening. Have placed dressing. No evidence of infection. Will change dressing Q 48'.  Movement is improved. Continues to work with PT.  Will now consider a SNF and is going over options.

## 2024-08-26 NOTE — Progress Notes (Addendum)
 Occupational Therapy Treatment Patient Details Name: [name] MRN: [id number] DOB: [birth year] Today's Date: 08/26/2024   History of present illness Pt is a 63 y/o F admitted for thoracic stenosis at T9/10 and LE weakness, now s/p thoracic decompression with neurosurgery on 06/29/24. Of note, pt had T9-10 laminectomy on 01/08/24 and was admitted to [facility] for rehab. PMHx: GERD, anxiety, HTN, fibromyalgia, hyperthyroidism, IBS, ACDF 3/4, posterior lateral arthrodesis 6/16, CSF leak 6/24.   OT comments  Pt received in bed, agreeable for OT visit. Attempted to practice toilet transfers this date. Pt still requiring mod A to come to EOB, limited by body habitus and general weakness. Session highly limited by RLE edema and secondary pain from donning R AFO/sneaker in preparation for mobility. Pt requiring max A for LB dressing and significantly increased time to complete. Stood to [employer] with mod A and incr time to come to upright. Attempted taking steps, but highly limited by RLE edema and pain in stance. Discomfort subsided with AFO/sneaker doffed. Returned to supine; OT will continue to follow.      If plan is discharge home, recommend the following:  Assist for transportation;Help with stairs or ramp for entrance;Assistance with cooking/housework;A lot of help with bathing/dressing/bathroom;A lot of help with walking and/or transfers   Equipment Recommendations  Other (comment) (defer)    Recommendations for Other Services      Precautions / Restrictions Precautions Precautions: Back;Fall;Other (comment) Precaution/Restrictions Comments: R AFO Restrictions Weight Bearing Restrictions Per Provider Order: No       Mobility Bed Mobility Overal bed mobility: Needs Assistance Bed Mobility: Supine to Sit, Sit to Supine     Supine to sit: Mod assist, HOB elevated, Used rails Sit to supine: Mod assist, HOB elevated, Used rails   General bed mobility comments: pt advancing RLE better towards  EOB this date, still needs A for trunk elevation and forward scooting to come to EOB    Transfers Overall transfer level: Needs assistance Equipment used: Rolling walker (2 wheels) Transfers: Sit to/from Stand Sit to Stand: Mod assist, From elevated surface           General transfer comment: Stood from bed with incr time to come to fully upright, cued for hand placement relative to RW.     Balance Overall balance assessment: Needs assistance Sitting-balance support: Feet supported, Bilateral upper extremity supported, Single extremity supported Sitting balance-[name]: Good Sitting balance - Comments: seated EOB, intermittently using UE's for support d/t pain in RLE   Standing balance support: Bilateral upper extremity supported, During functional activity, Reliant on assistive device for balance Standing balance-[name]: Poor Standing balance comment: heavily reliant on RW                           ADL either performed or assessed with clinical judgement   ADL Overall ADL's : Needs assistance/impaired                     Lower Body Dressing: Maximal assistance;Sitting/lateral leans Lower Body Dressing Details (indicate cue type and reason): donning B sneakers and R AFO; adjusting compression stockings             Functional mobility during ADLs: Minimal assistance;Rolling walker (2 wheels)      Extremity/Trunk Assessment              Vision       Perception     Praxis  Communication Communication Communication: No apparent difficulties   Cognition Arousal: Alert Behavior During Therapy: WFL for tasks assessed/performed Cognition: No apparent impairments                               Following commands: Intact        Cueing   Cueing Techniques: Verbal cues, Visual cues  Exercises      Shoulder Instructions       General Comments session heavily limited by RLE pain/edema (difficulty with fitting  foot/ankle into R AFO and sneaker.    Pertinent Vitals/ Pain       Pain Assessment Pain Assessment: Faces Faces Pain Scale: Hurts whole lot Pain Location: R foot/LE Pain Descriptors / Indicators: Discomfort, Grimacing, Guarding Pain Intervention(s): Repositioned, Monitored during session, Limited activity within patient's tolerance  Home Living                                          Prior Functioning/Environment              Frequency  Min 2X/week        Progress Toward Goals  OT Goals(current goals can now be found in the care plan section)  Progress towards OT goals: Progressing toward goals (slowly)  Acute Rehab OT Goals Time For Goal Achievement: 09/09/24  Plan      Co-evaluation                 AM-PAC OT 6 Clicks Daily Activity     Outcome Measure   Help from another person eating meals?: None Help from another person taking care of personal grooming?: A Little Help from another person toileting, which includes using toliet, bedpan, or urinal?: A Lot Help from another person bathing (including washing, rinsing, drying)?: A Lot Help from another person to put on and taking off regular upper body clothing?: A Little Help from another person to put on and taking off regular lower body clothing?: A Lot 6 Click Score: 16    End of Session Equipment Utilized During Treatment: Gait belt;Rolling walker (2 wheels)  OT Visit Diagnosis: Unsteadiness on feet (R26.81);Muscle weakness (generalized) (M62.81);Pain Pain - Right/Left: Right Pain - part of body:  (RLE)   Activity Tolerance No increased pain   Patient Left in bed;with call bell/phone within reach;with bed alarm set   Nurse Communication Mobility status        Time: 8859-8781 OT Time Calculation (min): 38 min  Charges: OT General Charges $OT Visit: 1 Visit OT Treatments $Self Care/Home Management : 38-52 mins  [name] M. [place], OTR/L [facility] Acute Rehabilitation  Services [phone] Secure Chat Preferred  Rikki Burma 08/26/2024, 4:36 PM

## 2024-08-26 NOTE — Plan of Care (Signed)
  Problem: Clinical Measurements: Goal: Ability to maintain clinical measurements within normal limits will improve Outcome: Progressing Goal: Will remain free from infection Outcome: Progressing Goal: Diagnostic test results will improve Outcome: Progressing Goal: Respiratory complications will improve Outcome: Progressing Goal: Cardiovascular complication will be avoided Outcome: Progressing   Problem: Coping: Goal: Level of anxiety will decrease Outcome: Progressing   Problem: Safety: Goal: Ability to remain free from injury will improve Outcome: Progressing   Problem: Skin Integrity: Goal: Risk for impaired skin integrity will decrease Outcome: Progressing

## 2024-08-27 NOTE — TOC Progression Note (Signed)
 Transition of Care [facility]) - Progression Note    Patient Details  Name: [name] MRN: [id number] Date of Birth: 24-Oct-1960  [facility]) CM/SW Contact  [name], [name] Phone Number: 08/27/2024, 2:19 PM  Clinical Narrative:   CSW met with patient, friends visiting at bedside. Patient's friends going to tour [place] this afternoon, will update patient with what they think after they tour. Patient has also not been able to discuss with her son, he has been too busy with work, but hopeful to be able to get him over the weekend. CSW to follow up with patient for SNF choice.    Expected Discharge Plan: Skilled Nursing Facility Barriers to Discharge: Continued Medical Work up               Expected Discharge Plan and Services   Discharge Planning Services: CM Consult Post Acute Care Choice: IP Rehab Living arrangements for the past 2 months: Single Family Home                                       Social Drivers of Health (SDOH) Interventions SDOH Screenings   Food Insecurity: No Food Insecurity (07/30/2024)  Housing: Low Risk  (07/30/2024)  Transportation Needs: No Transportation Needs (07/30/2024)  Utilities: Not At Risk (07/30/2024)  Depression (PHQ2-9): Low Risk  (01/12/2024)  Tobacco Use: Medium Risk (06/29/2024)    Readmission Risk Interventions    05/17/2022    3:02 PM  Readmission Risk Prevention Plan  Transportation Screening Complete  PCP or Specialist Appt within 5-7 Days Complete  Home Care Screening Complete  Medication Review (RN CM) Complete

## 2024-08-27 NOTE — Progress Notes (Signed)
 Patient ID: Cathryne LILLETTE Masters, female   DOB: 03-04-1961, 63 y.o.   MRN: [id number] BP (!) 147/67 (BP Location: Left Arm)   Pulse 73   Temp 98.7 F (37.1 C) (Oral)   Resp 18   Ht 5' 2 (1.575 m)   Wt 100.7 kg   LMP 11/20/2012   SpO2 97%   BMI 40.60 kg/m  Currently sleeping Looking into the nursing homes. No neurological changes by report PT continuing.

## 2024-08-27 NOTE — Progress Notes (Signed)
 Physical Therapy Treatment Patient Details Name: [name] MRN: [id number] DOB: 1961/03/28 Today's Date: 08/27/2024   History of Present Illness Pt is a 63 y/o F admitted for thoracic stenosis at T9/10 and LE weakness, now s/p thoracic decompression with neurosurgery on 06/29/24. Of note, pt had T9-10 laminectomy on 01/08/24 and was admitted to [facility] for rehab. PMHx: GERD, anxiety, HTN, fibromyalgia, hyperthyroidism, IBS, ACDF 3/4, posterior lateral arthrodesis 6/16, CSF leak 6/24.    PT Comments  Pt received in supine and agreeable to session. Pt limited by increased RLE edema and pain this session. Pt continues to require grossly mod A for bed mobility and transfers. Pt able to take a few steps forward and backward, but unable to progress due to increased pain and fatigue.  Pt agreeable to sit in recliner for lunch after receiving pain meds. Pt continues to benefit from PT services to progress toward functional mobility goals.     If plan is discharge home, recommend the following: A lot of help with bathing/dressing/bathroom;Assist for transportation;Help with stairs or ramp for entrance;Two people to help with walking and/or transfers;Assistance with cooking/housework   Can travel by private vehicle     No  Equipment Recommendations  Hospital bed    Recommendations for Other Services       Precautions / Restrictions Precautions Precautions: Back;Fall;Other (comment) Precaution/Restrictions Comments: R AFO Restrictions Weight Bearing Restrictions Per Provider Order: No     Mobility  Bed Mobility Overal bed mobility: Needs Assistance Bed Mobility: Rolling, Sidelying to Sit Rolling: Used rails, Mod assist Sidelying to sit: Mod assist, Used rails, HOB elevated     Sit to sidelying: Mod assist, Used rails General bed mobility comments: Assist for R knee flexion to roll, BLE R>L advancement, and trunk elevation. Assist for BLE elevation back to EOB and supine scoot to [facility]     Transfers Overall transfer level: Needs assistance Equipment used: Rolling walker (2 wheels) Transfers: Sit to/from Stand Sit to Stand: Mod assist, From elevated surface           General transfer comment: From EOB with mod A for power up    Ambulation/Gait Ambulation/Gait assistance: Min assist Gait Distance (Feet): 5 Feet Assistive device: Rolling walker (2 wheels) Gait Pattern/deviations: Step-to pattern, Trunk flexed, Decreased stance time - right, Decreased step length - left Gait velocity: Dec     General Gait Details: very slow steps with difficulty weight shifting and advancing BLE.   Stairs             Wheelchair Mobility     Tilt Bed    Modified Rankin (Stroke Patients Only)       Balance Overall balance assessment: Needs assistance Sitting-balance support: Feet supported, Bilateral upper extremity supported Sitting balance-[name]: Fair Sitting balance - Comments: reliant on BUE due to pain   Standing balance support: Bilateral upper extremity supported, During functional activity, Reliant on assistive device for balance Standing balance-[name]: Poor Standing balance comment: heavily reliant on RW                            Communication Communication Communication: No apparent difficulties  Cognition Arousal: Alert Behavior During Therapy: WFL for tasks assessed/performed   PT - Cognitive impairments: No apparent impairments                         Following commands: Intact      Cueing  Cueing Techniques: Verbal cues, Visual cues  Exercises General Exercises - Lower Extremity Long Arc Quad: AROM, Seated, Both, 5 reps, 10 reps    General Comments        Pertinent Vitals/Pain Pain Assessment Pain Assessment: 0-10 Pain Score: 10-Worst pain ever Pain Location: back, R foot, all over Pain Descriptors / Indicators: Discomfort, Grimacing, Guarding, Burning Pain Intervention(s): Limited activity  within patient's tolerance, Monitored during session, Repositioned     PT Goals (current goals can now be found in the care plan section) Acute Rehab PT Goals Patient Stated Goal: get better and go home PT Goal Formulation: With patient Time For Goal Achievement: 09/06/24 Progress towards PT goals: Progressing toward goals    Frequency    Min 2X/week       AM-PAC PT 6 Clicks Mobility   Outcome Measure  Help needed turning from your back to your side while in a flat bed without using bedrails?: A Little Help needed moving from lying on your back to sitting on the side of a flat bed without using bedrails?: A Lot Help needed moving to and from a bed to a chair (including a wheelchair)?: A Little Help needed standing up from a chair using your arms (e.g., wheelchair or bedside chair)?: A Lot Help needed to walk in hospital room?: A Lot Help needed climbing 3-5 steps with a railing? : Total 6 Click Score: 13    End of Session Equipment Utilized During Treatment: Gait belt (R AFO) Activity Tolerance: Patient tolerated treatment well;Patient limited by pain Patient left: in bed;with call bell/phone within reach;with bed alarm set Nurse Communication: Mobility status PT Visit Diagnosis: Unsteadiness on feet (R26.81);Other abnormalities of gait and mobility (R26.89);Muscle weakness (generalized) (M62.81)     Time: 9068-8969 PT Time Calculation (min) (ACUTE ONLY): 59 min  Charges:    $Gait Training: 8-22 mins $Therapeutic Exercise: 8-22 mins $Therapeutic Activity: 23-37 mins PT General Charges $$ ACUTE PT VISIT: 1 Visit                    Darryle George, PTA Acute Rehabilitation Services Secure Chat Preferred  [phone]    Darryle George 08/27/2024, 11:21 AM

## 2024-08-27 NOTE — Plan of Care (Signed)
   Problem: Clinical Measurements: Goal: Ability to maintain clinical measurements within normal limits will improve Outcome: Progressing Goal: Will remain free from infection Outcome: Progressing Goal: Diagnostic test results will improve Outcome: Progressing Goal: Respiratory complications will improve Outcome: Progressing Goal: Cardiovascular complication will be avoided Outcome: Progressing   Problem: Coping: Goal: Level of anxiety will decrease Outcome: Progressing

## 2024-08-27 NOTE — Plan of Care (Signed)
  Problem: Education: Goal: Knowledge of General Education information will improve Description: Including pain rating scale, medication(s)/side effects and non-pharmacologic comfort measures Outcome: Adequate for Discharge   Problem: Health Behavior/Discharge Planning: Goal: Ability to manage health-related needs will improve Outcome: Adequate for Discharge   Problem: Clinical Measurements: Goal: Ability to maintain clinical measurements within normal limits will improve Outcome: Adequate for Discharge Goal: Will remain free from infection Outcome: Adequate for Discharge Goal: Diagnostic test results will improve Outcome: Adequate for Discharge Goal: Respiratory complications will improve Outcome: Adequate for Discharge Goal: Cardiovascular complication will be avoided Outcome: Adequate for Discharge   Problem: Activity: Goal: Risk for activity intolerance will decrease Outcome: Adequate for Discharge   Problem: Nutrition: Goal: Adequate nutrition will be maintained Outcome: Adequate for Discharge   Problem: Coping: Goal: Level of anxiety will decrease Outcome: Adequate for Discharge   Problem: Elimination: Goal: Will not experience complications related to bowel motility Outcome: Adequate for Discharge Goal: Will not experience complications related to urinary retention Outcome: Adequate for Discharge   Problem: Pain Managment: Goal: General experience of comfort will improve and/or be controlled Outcome: Adequate for Discharge   Problem: Safety: Goal: Ability to remain free from injury will improve Outcome: Adequate for Discharge   Problem: Skin Integrity: Goal: Risk for impaired skin integrity will decrease Outcome: Adequate for Discharge   Problem: Education: Goal: Ability to verbalize activity precautions or restrictions will improve Outcome: Adequate for Discharge Goal: Knowledge of the prescribed therapeutic regimen will improve Outcome: Adequate for  Discharge Goal: Understanding of discharge needs will improve Outcome: Adequate for Discharge   Problem: Activity: Goal: Ability to avoid complications of mobility impairment will improve Outcome: Adequate for Discharge Goal: Ability to tolerate increased activity will improve Outcome: Adequate for Discharge Goal: Will remain free from falls Outcome: Adequate for Discharge   Problem: Bowel/Gastric: Goal: Gastrointestinal status for postoperative course will improve Outcome: Adequate for Discharge   Problem: Clinical Measurements: Goal: Ability to maintain clinical measurements within normal limits will improve Outcome: Adequate for Discharge Goal: Postoperative complications will be avoided or minimized Outcome: Adequate for Discharge Goal: Diagnostic test results will improve Outcome: Adequate for Discharge   Problem: Pain Management: Goal: Pain level will decrease Outcome: Adequate for Discharge   Problem: Skin Integrity: Goal: Will show signs of wound healing Outcome: Adequate for Discharge   Problem: Health Behavior/Discharge Planning: Goal: Identification of resources available to assist in meeting health care needs will improve Outcome: Adequate for Discharge   Problem: Bladder/Genitourinary: Goal: Urinary functional status for postoperative course will improve Outcome: Adequate for Discharge

## 2024-08-28 NOTE — Plan of Care (Signed)
   Problem: Education: Goal: Knowledge of General Education information will improve Description: Including pain rating scale, medication(s)/side effects and non-pharmacologic comfort measures Outcome: Progressing   Problem: Health Behavior/Discharge Planning: Goal: Ability to manage health-related needs will improve Outcome: Progressing   Problem: Clinical Measurements: Goal: Will remain free from infection Outcome: Progressing

## 2024-08-28 NOTE — Consult Note (Signed)
 WOC consulted for wound care however this patient had neurosurgery during this admission, appears wound was initially managed with Prevena. Neurosurgery notes from 11/20 state will change dressing Q 48 and have placed dressing  however  no wound care orders appear in the nursing orders.   I have asked bedside nursing to reach out to neurosurgery for clarification and added PA to [facility], CNS, CWON-AP [phone]

## 2024-08-28 NOTE — Progress Notes (Signed)
    Providing Compassionate, Quality Care - Together   NEUROSURGERY PROGRESS NOTE     S: NAEs o/n.    O: EXAM:  BP 136/71 (BP Location: Left Arm)   Pulse 84   Temp 98 F (36.7 C) (Oral)   Resp 16   Ht 5' 2 (1.575 m)   Wt 100.7 kg   LMP 11/20/2012   SpO2 97%   BMI 40.60 kg/m     Awake, alert, oriented  Speech fluent, appropriate  Strength/sensation grossly intact BUE/BLE Dressing c/d/i   ASSESSMENT:  63 y.o. s/p T9-10 lami/disc     PLAN: -Awaiting placement -Continue supportive care   Camie Pickle, [facility]

## 2024-08-28 NOTE — Plan of Care (Signed)

## 2024-08-29 NOTE — Progress Notes (Signed)
    Providing Compassionate, Quality Care - Together   NEUROSURGERY PROGRESS NOTE     S: No issues overnight.    O: EXAM:  BP 113/62 (BP Location: Left Arm)   Pulse 66   Temp 98.5 F (36.9 C)   Resp 18   Ht 5' 2 (1.575 m)   Wt 100.7 kg   LMP 11/20/2012   SpO2 97%   BMI 40.60 kg/m     Awake, alert, oriented  Speech fluent, appropriate  Strength/sensation grossly intact BUE/BLE   ASSESSMENT:  63 y.o. s/p T9-10 lami/disc     PLAN: -Awaiting placement -Continue supportive care   Camie Pickle, [facility]

## 2024-08-29 NOTE — Plan of Care (Signed)
 Assisted with toileting per staff. PRN request as ordered.    Problem: Education: Goal: Knowledge of General Education information will improve Description: Including pain rating scale, medication(s)/side effects and non-pharmacologic comfort measures Outcome: Progressing   Problem: Activity: Goal: Risk for activity intolerance will decrease Outcome: Progressing   Problem: Pain Managment: Goal: General experience of comfort will improve and/or be controlled Outcome: Progressing   Problem: Safety: Goal: Ability to remain free from injury will improve Outcome: Progressing   Problem: Skin Integrity: Goal: Risk for impaired skin integrity will decrease Outcome: Progressing

## 2024-08-29 NOTE — Plan of Care (Signed)
  Problem: Education: Goal: Knowledge of General Education information will improve Description: Including pain rating scale, medication(s)/side effects and non-pharmacologic comfort measures Outcome: Progressing   Problem: Clinical Measurements: Goal: Respiratory complications will improve Outcome: Progressing   Problem: Coping: Goal: Level of anxiety will decrease Outcome: Progressing   Problem: Activity: Goal: Risk for activity intolerance will decrease Outcome: Not Progressing

## 2024-08-30 NOTE — Plan of Care (Signed)
  Problem: Education: Goal: Knowledge of General Education information will improve Description: Including pain rating scale, medication(s)/side effects and non-pharmacologic comfort measures Outcome: Progressing   Problem: Nutrition: Goal: Adequate nutrition will be maintained Outcome: Progressing   Problem: Safety: Goal: Ability to remain free from injury will improve Outcome: Progressing   Problem: Activity: Goal: Risk for activity intolerance will decrease Outcome: Not Progressing

## 2024-08-30 NOTE — Progress Notes (Signed)
 Occupational Therapy Treatment Patient Details Name: [name] MRN: [id number] DOB: 1961-01-30 Today's Date: 08/30/2024   History of present illness Pt is a 63 y/o F admitted for thoracic stenosis at T9/10 and LE weakness, now s/p thoracic decompression with neurosurgery on 06/29/24. Of note, pt had T9-10 laminectomy on 01/08/24 and was admitted to [facility] for rehab. PMHx: GERD, anxiety, HTN, fibromyalgia, hyperthyroidism, IBS, ACDF 3/4, posterior lateral arthrodesis 6/16, CSF leak 6/24.   OT comments  Patient received in supine and agreeable to OT treatment. Patient continues to be able to perform self care seated on EOB with assistance for bathing back and feet, and donning footwear.  Patient declined mobility due to patient also has PT today.  Patient with increased effort to return to supine without assistance but continues to require mod assist.  Patient will benefit from continued inpatient follow up therapy, <3 hours/day.  Acute OT to continue to follow to address established goals to facilitate DC to next venue of care.        If plan is discharge home, recommend the following:  Assist for transportation;Help with stairs or ramp for entrance;Assistance with cooking/housework;A lot of help with bathing/dressing/bathroom;A lot of help with walking and/or transfers   Equipment Recommendations  Other (comment) (defer)    Recommendations for Other Services      Precautions / Restrictions Precautions Precautions: Back;Fall;Other (comment) Precaution Booklet Issued: Yes (comment) Recall of Precautions/Restrictions: Intact Precaution/Restrictions Comments: R AFO Restrictions Weight Bearing Restrictions Per Provider Order: No       Mobility Bed Mobility Overal bed mobility: Needs Assistance Bed Mobility: Rolling, Sidelying to Sit, Sit to Supine Rolling: Min assist Sidelying to sit: Mod assist, Used rails, HOB elevated Supine to sit: Mod assist, HOB elevated, Used rails      General bed mobility comments: patient with increased effort to go back to supine without assistance but continues to require mod assist    Transfers Overall transfer level: Needs assistance Equipment used: Rolling walker (2 wheels) Transfers: Sit to/from Stand Sit to Stand: Mod assist, From elevated surface           General transfer comment: sit to stand from EOB and side steps towards [facility]     Balance Overall balance assessment: Needs assistance Sitting-balance support: Feet supported, Bilateral upper extremity supported Sitting balance-[name]: Fair Sitting balance - Comments: able to perform self care tasks seated on EOB with no LOB   Standing balance support: Bilateral upper extremity supported, During functional activity, Reliant on assistive device for balance Standing balance-[name]: Poor Standing balance comment: heavily reliant on RW                           ADL either performed or assessed with clinical judgement   ADL Overall ADL's : Needs assistance/impaired     Grooming: Wash/dry hands;Wash/dry face;Sitting;Oral care;Set up Grooming Details (indicate cue type and reason): on EOB Upper Body Bathing: Minimal assistance;Sitting Upper Body Bathing Details (indicate cue type and reason): assistance for back seated on EOB Lower Body Bathing: Moderate assistance;Sitting/lateral leans Lower Body Bathing Details (indicate cue type and reason): patient bathed peri area with lateral leaning and requires assistance to bathe feet Upper Body Dressing : Set up;Sitting Upper Body Dressing Details (indicate cue type and reason): for gown change Lower Body Dressing: Maximal assistance;Sitting/lateral leans Lower Body Dressing Details (indicate cue type and reason): donning B sneakers and R AFO; adjusting compression stockings  Extremity/Trunk Assessment              Water Quality Scientist Communication: No apparent difficulties   Cognition Arousal: Alert Behavior During Therapy: WFL for tasks assessed/performed Cognition: No apparent impairments                               Following commands: Intact        Cueing   Cueing Techniques: Verbal cues, Visual cues  Exercises      Shoulder Instructions       General Comments      Pertinent Vitals/ Pain       Pain Assessment Pain Assessment: Faces Faces Pain Scale: Hurts little more Pain Location: back Pain Descriptors / Indicators: Discomfort, Grimacing, Guarding Pain Intervention(s): Limited activity within patient's tolerance, Monitored during session, Repositioned  Home Living                                          Prior Functioning/Environment              Frequency  Min 2X/week        Progress Toward Goals  OT Goals(current goals can now be found in the care plan section)  Progress towards OT goals: Progressing toward goals  Acute Rehab OT Goals Patient Stated Goal: to get better OT Goal Formulation: With patient Time For Goal Achievement: 09/09/24 Potential to Achieve Goals: Fair ADL Goals Pt Will Perform Grooming: with contact guard assist;standing (with assist of lift equipment PRN) Pt Will Perform Upper Body Dressing: with modified independence;sitting Pt Will Perform Lower Body Dressing: with contact guard assist;with adaptive equipment;sitting/lateral leans Pt Will Transfer to Toilet: with min assist;ambulating;bedside commode;grab bars;regular height toilet Additional ADL Goal #1: Pt will recall spine precautions 100% of the time during OT sessions for improved safety. Additional ADL Goal #2: pt will static stand with no more than one UE support ~1 minute to facilitate functional participation in BADL  Plan      Co-evaluation                 AM-PAC OT 6 Clicks Daily Activity     Outcome Measure   Help  from another person eating meals?: None Help from another person taking care of personal grooming?: A Little Help from another person toileting, which includes using toliet, bedpan, or urinal?: A Lot Help from another person bathing (including washing, rinsing, drying)?: A Lot Help from another person to put on and taking off regular upper body clothing?: A Little Help from another person to put on and taking off regular lower body clothing?: A Lot 6 Click Score: 16    End of Session Equipment Utilized During Treatment: Gait belt;Rolling walker (2 wheels)  OT Visit Diagnosis: Unsteadiness on feet (R26.81);Muscle weakness (generalized) (M62.81);Pain Pain - part of body:  (back)   Activity Tolerance Patient tolerated treatment well   Patient Left in bed;with call bell/phone within reach;with bed alarm set   Nurse Communication Mobility status        Time: 9281-9186 OT Time Calculation (min): 55 min  Charges: OT General Charges $OT Visit: 1 Visit OT Treatments $Self Care/Home Management : 53-67 mins  .[email or web address]  Jeb LITTIE Laine 08/30/2024, 8:47 AM

## 2024-08-30 NOTE — TOC Progression Note (Signed)
 Transition of Care [facility]) - Progression Note    Patient Details  Name: [name] MRN: [id number] Date of Birth: 1961/09/04  [facility]) CM/SW Contact  [name], [name] Phone Number: 08/30/2024, 3:37 PM  Clinical Narrative:   CSW met with patient, her friend toured [place] and thought it looked like a good option. Patient interested in seeing the rehab space, CSW contacted [facility] admissions to ask for a photo of their rehab gym. CSW to show patient when received. CSW to follow.    Expected Discharge Plan: Skilled Nursing Facility Barriers to Discharge: English As A Second Language Teacher, Continued Medical Work up               Expected Discharge Plan and Services   Discharge Planning Services: CM Consult Post Acute Care Choice: IP Rehab Living arrangements for the past 2 months: Single Family Home                                       Social Drivers of Health (SDOH) Interventions SDOH Screenings   Food Insecurity: No Food Insecurity (07/30/2024)  Housing: Low Risk  (07/30/2024)  Transportation Needs: No Transportation Needs (07/30/2024)  Utilities: Not At Risk (07/30/2024)  Depression (PHQ2-9): Low Risk  (01/12/2024)  Tobacco Use: Medium Risk (06/29/2024)    Readmission Risk Interventions    05/17/2022    3:02 PM  Readmission Risk Prevention Plan  Transportation Screening Complete  PCP or Specialist Appt within 5-7 Days Complete  Home Care Screening Complete  Medication Review (RN CM) Complete

## 2024-08-30 NOTE — Progress Notes (Signed)
 Patient ID: [name], female   DOB: 24-Oct-1960, 63 y.o.   MRN: [id number] Alert and oriented x 4 Moving all extremities Awaiting placement Working with PT and OT

## 2024-08-30 NOTE — Plan of Care (Signed)
 In bed . Repositioned. States pain always at 10. Asleep at times. Meds as ordered.   Problem: Education: Goal: Knowledge of General Education information will improve Description: Including pain rating scale, medication(s)/side effects and non-pharmacologic comfort measures Outcome: Progressing   Problem: Activity: Goal: Risk for activity intolerance will decrease Outcome: Progressing   Problem: Nutrition: Goal: Adequate nutrition will be maintained Outcome: Progressing   Problem: Skin Integrity: Goal: Will show signs of wound healing Outcome: Progressing

## 2024-08-30 NOTE — Progress Notes (Addendum)
    Providing Compassionate, Quality Care - Together   NEUROSURGERY PROGRESS NOTE     S: No issues overnight. Pt open to CIR per EMR review. On bedpan at time of exam.    O: EXAM:  BP 131/67   Pulse 79   Temp 98.3 F (36.8 C) (Oral)   Resp 17   Ht 5' 2 (1.575 m)   Wt 100.7 kg   LMP 11/20/2012   SpO2 94%   BMI 40.60 kg/m     Awake, alert, oriented  Speech fluent, appropriate  MAEs   ASSESSMENT:  63 y.o. s/p T9-10 lami/disc     PLAN: -Awaiting placement, CIR vs SNF.  -Continue supportive care -Continue Q48H dressing changes.    Camie Pickle, [facility]

## 2024-08-31 NOTE — Progress Notes (Signed)
 Patient ID: [name], female   DOB: 1961/06/26, 63 y.o.   MRN: [id number] BP (!) 148/77 (BP Location: Left Arm) Comment: Alerted Zahra RN  Pulse 73   Temp 98.7 F (37.1 C) (Oral)   Resp 16   Ht 5' 2 (1.575 m)   Wt 100.7 kg   LMP 11/20/2012   SpO2 98%   BMI 40.60 kg/m   Alert and oriented x 4 Speech is clear and fluent Moving all extremities well Evaluating facilities, working with therapy Wound without signs of infection

## 2024-08-31 NOTE — Plan of Care (Signed)

## 2024-08-31 NOTE — Progress Notes (Signed)
 Changed dressing to spine per wound care order.  Wound bed is red and patient says it burns.  It has minimal serosanguinous drainage. Patient tolerated well.

## 2024-08-31 NOTE — Progress Notes (Signed)
    Providing Compassionate, Quality Care - Together   NEUROSURGERY PROGRESS NOTE     S: No issues overnight. Nursing staff said wound looked good at time of dressing change.    O: EXAM:  BP (!) 128/52 (BP Location: Left Arm)   Pulse 70   Temp 98.6 F (37 C)   Resp 16   Ht 5' 2 (1.575 m)   Wt 100.7 kg   LMP 11/20/2012   SpO2 94%   BMI 40.60 kg/m     Awake, alert, oriented  Speech fluent, appropriate  MAEs   ASSESSMENT:  63 y.o. s/p T9-10 lami/disc     PLAN: -Awaiting placement, CIR vs SNF.  -Continue supportive care -Continue Q48H dressing changes   Camie Pickle, [facility]

## 2024-08-31 NOTE — Progress Notes (Signed)
 Physical Therapy Treatment Patient Details Name: [name] MRN: [id number] DOB: May 04, 1961 Today's Date: 08/31/2024   History of Present Illness Pt is a 63 y/o F admitted for thoracic stenosis at T9/10 and LE weakness, now s/p thoracic decompression with neurosurgery on 06/29/24. Of note, pt had T9-10 laminectomy on 01/08/24 and was admitted to [facility] for rehab. PMHx: GERD, anxiety, HTN, fibromyalgia, hyperthyroidism, IBS, ACDF 3/4, posterior lateral arthrodesis 6/16, CSF leak 6/24.    PT Comments  Pt progressing well with PT goals. With further education on utilizing bed controls to improve independence with bed mobility, pt requiring only light minA for LE management to EOB during rolling and sidelying>sit. She is demonstrating good initiation and use of momentum for STS, but requires modA to power up fully into standing. Able to ambulate 68' x2 bouts with RW and CGA, requiring a seated rest break in between. Tactile and verbal cueing for improved R toe clearance as well as R quad contraction during R stance given, with good carryover demonstrated. Discussed importance of strengthening exercises in bed and pt states adherence to these. Recommending post-acute rehab >3hrs/day to improve general strengthening and tolerance for functional mobility. Acute PT to follow.    If plan is discharge home, recommend the following: A lot of help with bathing/dressing/bathroom;Assist for transportation;Help with stairs or ramp for entrance;Two people to help with walking and/or transfers;Assistance with cooking/housework   Can travel by private vehicle     No  Equipment Recommendations  Hospital bed    Recommendations for Other Services Rehab consult     Precautions / Restrictions Precautions Precautions: Back;Fall;Other (comment) Precaution Booklet Issued: Yes (comment) Recall of Precautions/Restrictions: Intact Precaution/Restrictions Comments: R AFO Restrictions Weight Bearing Restrictions Per  Provider Order: No     Mobility  Bed Mobility Overal bed mobility: Needs Assistance Bed Mobility: Rolling, Sidelying to Sit, Sit to Supine Rolling: Min assist Sidelying to sit: Min assist, Used rails   Sit to supine: Mod assist, Used rails   General bed mobility comments: Education today on utilizing bed controls to improve independence with bed mobility. Pt only requiring light minA for rolling and sidelying>sit, namely for initiating LE management off EOB. ModA for LE elevation required for sit>supine    Transfers Overall transfer level: Needs assistance Equipment used: Rolling walker (2 wheels) Transfers: Sit to/from Stand Sit to Stand: Mod assist           General transfer comment: Pt demonstrates good initiation for STS, utilizing momentum to assist her. However, requires modA to fully power up into standing    Ambulation/Gait Ambulation/Gait assistance: Contact guard assist Gait Distance (Feet): 61 Feet (x2) Assistive device: Rolling walker (2 wheels) Gait Pattern/deviations: Step-through pattern, Decreased stride length, Decreased dorsiflexion - right, Trunk flexed Gait velocity: Dec Gait velocity interpretation: <1.31 ft/sec, indicative of household ambulator   General Gait Details: Effortful advancement of R LE with cues to contract R quad during stance to prevent R knee buckle. No knee buckling noted today. Overall slow gait and heavily unloading through B hands on RW. Cues for inc R toe clearance given and demonstrated good, maintained carryover   Stairs             Wheelchair Mobility     Tilt Bed    Modified Rankin (Stroke Patients Only)       Balance Overall balance assessment: Needs assistance Sitting-balance support: Feet supported, Single extremity supported Sitting balance-[name]: Fair Sitting balance - Comments: Able to lay back slightly in sitting  and bring herself back into sitting without physical assist or rails.   Standing  balance support: Bilateral upper extremity supported, During functional activity, Reliant on assistive device for balance Standing balance-[name]: Poor Standing balance comment: heavily reliant on RW                            Communication Communication Communication: No apparent difficulties  Cognition Arousal: Alert Behavior During Therapy: WFL for tasks assessed/performed   PT - Cognitive impairments: No apparent impairments                         Following commands: Intact      Cueing Cueing Techniques: Verbal cues  Exercises      General Comments General comments (skin integrity, edema, etc.): Slight R LE edema noted, not pitting      Pertinent Vitals/Pain Pain Assessment Pain Assessment: Faces Faces Pain Scale: Hurts little more Pain Location: B hands during ambulation as she was pushing down through RW Pain Descriptors / Indicators: Discomfort Pain Intervention(s): Limited activity within patient's tolerance, Monitored during session    Home Living                          Prior Function            PT Goals (current goals can now be found in the care plan section) Acute Rehab PT Goals Patient Stated Goal: get better and go home PT Goal Formulation: With patient Time For Goal Achievement: 09/06/24 Potential to Achieve Goals: Fair Progress towards PT goals: Progressing toward goals    Frequency    Min 2X/week      PT Plan      Co-evaluation              AM-PAC PT 6 Clicks Mobility   Outcome Measure  Help needed turning from your back to your side while in a flat bed without using bedrails?: A Little Help needed moving from lying on your back to sitting on the side of a flat bed without using bedrails?: A Little Help needed moving to and from a bed to a chair (including a wheelchair)?: A Little Help needed standing up from a chair using your arms (e.g., wheelchair or bedside chair)?: A Lot Help needed  to walk in hospital room?: A Little Help needed climbing 3-5 steps with a railing? : Total 6 Click Score: 15    End of Session Equipment Utilized During Treatment: Gait belt (R AFO) Activity Tolerance: Patient tolerated treatment well Patient left: in bed;with bed alarm set;with call bell/phone within reach Nurse Communication: Mobility status PT Visit Diagnosis: Unsteadiness on feet (R26.81);Other abnormalities of gait and mobility (R26.89);Muscle weakness (generalized) (M62.81)     Time: 8498-8450 PT Time Calculation (min) (ACUTE ONLY): 48 min  Charges:    $Gait Training: 8-22 mins $Therapeutic Activity: 23-37 mins PT General Charges $$ ACUTE PT VISIT: 1 Visit                     Murrel Freet, SPT    Doshie Maggi 08/31/2024, 4:29 PM

## 2024-09-01 NOTE — TOC Progression Note (Signed)
 Transition of Care [facility]) - Progression Note    Patient Details  Name: [name] MRN: [id number] Date of Birth: 06-05-1961  Transition of Care [facility]) CM/SW Contact  [name], [name] Phone Number: 09/01/2024, 3:24 PM  Clinical Narrative:   CSW met with patient to provide updated photos on the therapy gym at [facility], and patient said she would be in agreement to go there. CSW asked about sending in to insurance, and patient requested to speak with MD to ensure he was comfortable with her wound first. CSW noting in MD notes that there's no concern for infection at this time. CSW contacted CMA to initiate insurance authorization, and verified with [facility] about bed availability on Friday. CSW to follow.    Expected Discharge Plan: Skilled Nursing Facility Barriers to Discharge: English As A Second Language Teacher, Continued Medical Work up               Expected Discharge Plan and Services   Discharge Planning Services: CM Consult Post Acute Care Choice: IP Rehab Living arrangements for the past 2 months: Single Family Home                                       Social Drivers of Health (SDOH) Interventions SDOH Screenings   Food Insecurity: No Food Insecurity (07/30/2024)  Housing: Low Risk  (07/30/2024)  Transportation Needs: No Transportation Needs (07/30/2024)  Utilities: Not At Risk (07/30/2024)  Depression (PHQ2-9): Low Risk  (01/12/2024)  Tobacco Use: Medium Risk (06/29/2024)    Readmission Risk Interventions    05/17/2022    3:02 PM  Readmission Risk Prevention Plan  Transportation Screening Complete  PCP or Specialist Appt within 5-7 Days Complete  Home Care Screening Complete  Medication Review (RN CM) Complete

## 2024-09-01 NOTE — Progress Notes (Signed)
 Patient ID: [name], female   DOB: Aug 30, 1961, 63 y.o.   MRN: [id number] BP 132/65 (BP Location: Left Arm)   Pulse 78   Temp 99.8 F (37.7 C) (Oral)   Resp 18   Ht 5' 2 (1.575 m)   Wt 100.7 kg   LMP 11/20/2012   SpO2 95%   BMI 40.60 kg/m  Alert and oriented x 4, speech is clear and fluent Will check wound on Friday Got up with PT Hesitant about discharge unless wound is dry, she has had multiple wound infections previously. Paranoia justified.

## 2024-09-01 NOTE — Progress Notes (Signed)
 Physical Therapy Treatment Patient Details Name: [name] MRN: [id number] DOB: Sep 24, 1961 Today's Date: 09/01/2024   History of Present Illness Pt is a 63 y/o F admitted for thoracic stenosis at T9/10 and LE weakness, now s/p thoracic decompression with neurosurgery on 06/29/24. Of note, pt had T9-10 laminectomy on 01/08/24 and was admitted to [facility] for rehab. PMHx: GERD, anxiety, HTN, fibromyalgia, hyperthyroidism, IBS, ACDF 3/4, posterior lateral arthrodesis 6/16, CSF leak 6/24.    PT Comments  Pt received in supine and agreeable to session. Pt continues to require assist with RLE during bed mobility due to weakness. Pt able to perform gait trial despite increased pain. Pt continues to demonstrate heavy reliance on RW support, but no buckling noted. Encouraged pt to increased time EOB/OOB for improved strength and endurance. Pt continues to benefit from PT services to progress toward functional mobility goals.    If plan is discharge home, recommend the following: A lot of help with bathing/dressing/bathroom;Assist for transportation;Help with stairs or ramp for entrance;Two people to help with walking and/or transfers;Assistance with cooking/housework   Can travel by private vehicle     No  Equipment Recommendations  Hospital bed    Recommendations for Other Services       Precautions / Restrictions Precautions Precautions: Back;Fall;Other (comment) Recall of Precautions/Restrictions: Intact Precaution/Restrictions Comments: R AFO Restrictions Weight Bearing Restrictions Per Provider Order: No     Mobility  Bed Mobility Overal bed mobility: Needs Assistance Bed Mobility: Rolling, Sidelying to Sit, Sit to Supine Rolling: Min assist Sidelying to sit: Min assist, Used rails   Sit to supine: Mod assist, Used rails   General bed mobility comments: Assist for RLE to roll and cues for back precautions. Increased assist for BLE elevation back to EOB    Transfers Overall  transfer level: Needs assistance Equipment used: Rolling walker (2 wheels) Transfers: Sit to/from Stand Sit to Stand: Mod assist           General transfer comment: STS from EOB and w/c with mod A for anterior weight shift and rise. Cues for sequencing from w/c    Ambulation/Gait Ambulation/Gait assistance: Contact guard assist Gait Distance (Feet): 55 Feet Assistive device: Rolling walker (2 wheels) Gait Pattern/deviations: Step-through pattern, Decreased stride length, Decreased dorsiflexion - right, Trunk flexed Gait velocity: Dec     General Gait Details: Effortful advancement of R LE with cuesfor increased foot clearance. Heavy reliance on BUE support   Stairs             Wheelchair Mobility     Tilt Bed    Modified Rankin (Stroke Patients Only)       Balance Overall balance assessment: Needs assistance Sitting-balance support: Feet supported, Single extremity supported Sitting balance-[name]: Fair     Standing balance support: Bilateral upper extremity supported, During functional activity, Reliant on assistive device for balance Standing balance-[name]: Poor Standing balance comment: heavily reliant on RW                            Communication Communication Communication: No apparent difficulties  Cognition Arousal: Alert Behavior During Therapy: WFL for tasks assessed/performed   PT - Cognitive impairments: No apparent impairments                         Following commands: Intact      Cueing Cueing Techniques: Verbal cues  Exercises      General  Comments        Pertinent Vitals/Pain Pain Assessment Pain Assessment: Faces Faces Pain Scale: Hurts even more Pain Location: back Pain Descriptors / Indicators: Discomfort Pain Intervention(s): Limited activity within patient's tolerance, Monitored during session, Repositioned, RN gave pain meds during session     PT Goals (current goals can now be  found in the care plan section) Acute Rehab PT Goals Patient Stated Goal: get better and go home PT Goal Formulation: With patient Time For Goal Achievement: 09/06/24 Progress towards PT goals: Progressing toward goals    Frequency    Min 2X/week       AM-PAC PT 6 Clicks Mobility   Outcome Measure  Help needed turning from your back to your side while in a flat bed without using bedrails?: A Little Help needed moving from lying on your back to sitting on the side of a flat bed without using bedrails?: A Little Help needed moving to and from a bed to a chair (including a wheelchair)?: A Little Help needed standing up from a chair using your arms (e.g., wheelchair or bedside chair)?: A Lot Help needed to walk in hospital room?: A Little Help needed climbing 3-5 steps with a railing? : Total 6 Click Score: 15    End of Session Equipment Utilized During Treatment: Gait belt (R AFO) Activity Tolerance: Patient tolerated treatment well Patient left: in bed;with bed alarm set;with call bell/phone within reach Nurse Communication: Mobility status PT Visit Diagnosis: Unsteadiness on feet (R26.81);Other abnormalities of gait and mobility (R26.89);Muscle weakness (generalized) (M62.81)     Time: 8542-8454 PT Time Calculation (min) (ACUTE ONLY): 48 min  Charges:    $Gait Training: 23-37 mins $Therapeutic Activity: 8-22 mins PT General Charges $$ ACUTE PT VISIT: 1 Visit                    Darryle George, PTA Acute Rehabilitation Services Secure Chat Preferred  [phone]    Darryle George 09/01/2024, 4:08 PM

## 2024-09-02 NOTE — Progress Notes (Signed)
 Patient ID: [name], female   DOB: 1960/11/28, 63 y.o.   MRN: [id number] No change, CCM

## 2024-09-02 NOTE — Plan of Care (Signed)
  Problem: Pain Managment: Goal: General experience of comfort will improve and/or be controlled Outcome: Progressing   Problem: Safety: Goal: Ability to remain free from injury will improve Outcome: Progressing

## 2024-09-03 NOTE — Progress Notes (Signed)
 Occupational Therapy Treatment Patient Details Name: [name] MRN: [id number] DOB: 04-16-1961 Today's Date: 09/03/2024   History of present illness Pt is a 63 y/o F admitted for thoracic stenosis at T9/10 and LE weakness, now s/p thoracic decompression with neurosurgery on 06/29/24. Of note, pt had T9-10 laminectomy on 01/08/24 and was admitted to [facility] for rehab. PMHx: GERD, anxiety, HTN, fibromyalgia, hyperthyroidism, IBS, ACDF 3/4, posterior lateral arthrodesis 6/16, CSF leak 6/24.   OT comments  Pt seen this PM for OT treatment. She is making fair progress towards OT goals this date. Tolerated ~6-8 minutes dynamic standing at the sink for various grooming ADLs. Required seated rest break prior to completing remainder of functional ambulation ~20' in total with RW. Max A for LB dressing, although noted improved LE edema and improved fit of R AFO. Still limited by body habitus, pain, and RLE weakness. Lengthy discussion with pt re: using BSC with nursing staff for increased mobility when therapy not present. Pt declining because she doesn't trust her RLE and prefers to stick with using the bed pan. Re-educated pt that this will only hinder her overall progress. OT to continue to follow and progress as able.      If plan is discharge home, recommend the following:  Assist for transportation;Help with stairs or ramp for entrance;Assistance with cooking/housework;A lot of help with bathing/dressing/bathroom;A lot of help with walking and/or transfers   Equipment Recommendations  Other (comment) (defer)    Recommendations for Other Services      Precautions / Restrictions Precautions Precautions: Back;Fall Recall of Precautions/Restrictions: Intact Precaution/Restrictions Comments: R AFO Restrictions Weight Bearing Restrictions Per Provider Order: No       Mobility Bed Mobility Overal bed mobility: Needs Assistance Bed Mobility: Rolling, Sidelying to Sit, Sit to Supine Rolling:  Min assist, Used rails Sidelying to sit: Mod assist, Used rails, HOB elevated   Sit to supine: Mod assist, Used rails   General bed mobility comments: assist to transition LE's off EOB, heavily reliant on bed rails for assist with bed mobility    Transfers Overall transfer level: Needs assistance Equipment used: Rolling walker (2 wheels) Transfers: Sit to/from Stand, Bed to chair/wheelchair/BSC Sit to Stand: Mod assist, From elevated surface     Step pivot transfers: Mod assist     General transfer comment: Stood from bed and w/c height with mod A and incr time/effort to come to stance. R foot blocked to prevent sliding. Pt insisting on standing from heightened bed, challenged pt to stand from lower surface to challenge LE strength/powering up mechanism.     Balance Overall balance assessment: Needs assistance Sitting-balance support: Feet supported, Single extremity supported Sitting balance-[name]: Fair Sitting balance - Comments: incr reliance on bed rail for stability to prevent posterior LOB likely 2/2 body habitus and pain in back Postural control: Posterior lean Standing balance support: Bilateral upper extremity supported, During functional activity, Reliant on assistive device for balance Standing balance-[name]: Poor Standing balance comment: heavily reliant on RW and sink vanity during standing ADLs                           ADL either performed or assessed with clinical judgement   ADL Overall ADL's : Needs assistance/impaired     Grooming: Contact guard assist;Standing;Oral care;Wash/dry hands Grooming Details (indicate cue type and reason): at sink             Lower Body Dressing: Maximal assistance;Cueing for  back precautions;Sitting/lateral leans Lower Body Dressing Details (indicate cue type and reason): assisted with donning R AFO and B sneakers               General ADL Comments: Discussed pt using BSC with RN staff, pt  declining wanting to use the [facility] because she has to call for assist and she doesn't trust her R leg. Reinforced that continuing to use the bed pan with RN staff will only hinder her progress. Pt verbalizing understanding but continuing to decline using BSC.    Extremity/Trunk Assessment              Vision       Restaurant Manager, [employer] Communication: No apparent difficulties   Cognition Arousal: Alert Behavior During Therapy: WFL for tasks assessed/performed Cognition: No apparent impairments             OT - Cognition Comments: intermittently tearful t/o session                 Following commands: Intact        Cueing   Cueing Techniques: Verbal cues  Exercises      Shoulder Instructions       General Comments BLE edema improved this session, allowing for improved fit of R AFO    Pertinent Vitals/ Pain       Pain Assessment Pain Assessment: Faces Faces Pain Scale: Hurts even more Pain Location: back, RLE, B palms Pain Descriptors / Indicators: Discomfort, Grimacing Pain Intervention(s): Monitored during session, Limited activity within patient's tolerance, Repositioned  Home Living                                          Prior Functioning/Environment              Frequency  Min 2X/week        Progress Toward Goals  OT Goals(current goals can now be found in the care plan section)  Progress towards OT goals: Progressing toward goals     Plan      Co-evaluation                 AM-PAC OT 6 Clicks Daily Activity     Outcome Measure   Help from another person eating meals?: None Help from another person taking care of personal grooming?: A Little Help from another person toileting, which includes using toliet, bedpan, or urinal?: A Lot Help from another person bathing (including washing, rinsing, drying)?: A Lot Help from another person to put on and taking off  regular upper body clothing?: A Little Help from another person to put on and taking off regular lower body clothing?: A Lot 6 Click Score: 16    End of Session Equipment Utilized During Treatment: Gait belt;Rolling walker (2 wheels) (R AFO)  OT Visit Diagnosis: Unsteadiness on feet (R26.81);Muscle weakness (generalized) (M62.81);Pain   Activity Tolerance Patient tolerated treatment well   Patient Left in bed;with bed alarm set;with call bell/phone within reach;with SCD's reapplied   Nurse Communication          Time: 8550-8447 OT Time Calculation (min): 63 min  Charges: OT General Charges $OT Visit: 1 Visit OT Treatments $Self Care/Home Management : 53-67 mins  Brock Larmon M. [place], OTR/L [facility] Acute Rehabilitation Services [phone] Secure Chat Preferred  Diamon Reddinger 09/03/2024, 4:58  PM

## 2024-09-03 NOTE — Progress Notes (Signed)
 Wound care orders followed to apply new dressing to patient's back at this time as dressing was found to be rolled up and completely off of her skin when I was changing the bed pad.

## 2024-09-03 NOTE — TOC Progression Note (Signed)
 Transition of Care [facility]) - Progression Note    Patient Details  Name: [name] MRN: [id number] Date of Birth: April 03, 1961  [facility]) CM/SW Contact  Montie LOISE Louder, [name] Phone Number: 09/03/2024, 2:38 PM  Clinical Narrative:     CSW received insurance shara  #[id number] reference # [id number]  11/27-12/2  1:56 p - sent text to admission 2:18p  called lvm to return call 2:34 p Informed [facility] Admission, authorization has been approved- CSW was informed DON not there and will have to accept patient tomorrow.   Please completed d/c summary as soon as possible in the am.  TOC will continue to follow and assist with discharge planning.  Montie Louder, MSW, LCSW Clinical Social Worker    Expected Discharge Plan: Skilled Nursing Facility Barriers to Discharge: English As A Second Language Teacher, Continued Medical Work up               Expected Discharge Plan and Services   Discharge Planning Services: CM Consult Post Acute Care Choice: IP Rehab Living arrangements for the past 2 months: Single Family Home                                       Social Drivers of Health (SDOH) Interventions SDOH Screenings   Food Insecurity: No Food Insecurity (07/30/2024)  Housing: Low Risk  (07/30/2024)  Transportation Needs: No Transportation Needs (07/30/2024)  Utilities: Not At Risk (07/30/2024)  Depression (PHQ2-9): Low Risk  (01/12/2024)  Tobacco Use: Medium Risk (06/29/2024)    Readmission Risk Interventions    05/17/2022    3:02 PM  Readmission Risk Prevention Plan  Transportation Screening Complete  PCP or Specialist Appt within 5-7 Days Complete  Home Care Screening Complete  Medication Review (RN CM) Complete

## 2024-09-03 NOTE — Progress Notes (Signed)
 Patient ID: [name], female   DOB: 1960-12-29, 63 y.o.   MRN: [id number] No change.  Continue current management.

## 2024-09-04 MED ORDER — CYCLOBENZAPRINE HCL 10 MG PO TABS: 10.0000 mg | ORAL_TABLET | Freq: Three times a day (TID) | ORAL | 0 refills | Status: DC

## 2024-09-04 MED ORDER — OXYCODONE-ACETAMINOPHEN 10-325 MG PO TABS: 1.0000 | ORAL_TABLET | Freq: Every day | ORAL | 0 refills | Status: DC

## 2024-09-04 NOTE — Plan of Care (Signed)

## 2024-09-04 NOTE — Progress Notes (Signed)
 NEUROSURGERY PROGRESS NOTE  Doing well. No acute events overnight. She is in really good spirits  Temp:  [97.4 F (36.3 C)-98.3 F (36.8 C)] 97.8 F (36.6 C) (11/29 0815) Pulse Rate:  [59-98] 98 (11/29 0815) Resp:  [16-18] 18 (11/29 0815) BP: (125-151)/(55-126) 135/55 (11/29 0815) SpO2:  [93 %-100 %] 100 % (11/29 0815)   Suzen Chiquita Pean, NP 09/04/2024 8:44 AM '

## 2024-09-04 NOTE — Plan of Care (Signed)
 Sat in wheelchair and walked in the hallway. Enjoyed her short distance walk.  Problem: Education: Goal: Knowledge of General Education information will improve Description: Including pain rating scale, medication(s)/side effects and non-pharmacologic comfort measures Outcome: Progressing   Problem: Clinical Measurements: Goal: Will remain free from infection Outcome: Progressing   Problem: Coping: Goal: Level of anxiety will decrease Outcome: Progressing   Problem: Activity: Goal: Risk for activity intolerance will decrease Outcome: Progressing   Problem: Pain Managment: Goal: General experience of comfort will improve and/or be controlled Outcome: Progressing   Problem: Education: Goal: Knowledge of the prescribed therapeutic regimen will improve Outcome: Progressing   Problem: Skin Integrity: Goal: Risk for impaired skin integrity will decrease Outcome: Progressing

## 2024-09-04 NOTE — TOC Progression Note (Addendum)
 Transition of Care [facility]) - Progression Note    Patient Details  Name: [name] MRN: [id number] Date of Birth: [birth year]  Transition of Care [facility]) CM/SW Contact  Karyna Bessler [place], [name] Phone Number: 09/04/2024, 3:50 PM  Clinical Narrative: Spoke to Brittany at [facility] who confirmed they are prepared to admit pt tomorrow but will need the dc summary before 12pm. Updated Neurosurgery NP and RN.   Julien Das, MSW, LCSW [phone] (coverage)        Expected Discharge Plan: Skilled Nursing Facility Barriers to Discharge: Insurance Authorization, Continued Medical Work up               Expected Discharge Plan and Services   Discharge Planning Services: CM Consult Post Acute Care Choice: IP Rehab Living arrangements for the past 2 months: Single Family Home                                       Social Drivers of Health (SDOH) Interventions SDOH Screenings   Food Insecurity: No Food Insecurity (07/30/2024)  Housing: Low Risk  (07/30/2024)  Transportation Needs: No Transportation Needs (07/30/2024)  Utilities: Not At Risk (07/30/2024)  Depression (PHQ2-9): Low Risk  (01/12/2024)  Tobacco Use: Medium Risk (06/29/2024)    Readmission Risk Interventions    05/17/2022    3:02 PM  Readmission Risk Prevention Plan  Transportation Screening Complete  PCP or Specialist Appt within 5-7 Days Complete  Home Care Screening Complete  Medication Review (RN CM) Complete

## 2024-09-04 NOTE — Discharge Summary (Addendum)
 Physician Discharge Summary  Patient ID: [name] MRN: [id number] DOB/AGE: [birth year] 62 y.o.  Admit date: 06/29/2024 Discharge date: 09/05/2024  Admission Diagnoses: lower extremity weakness   Discharge Diagnoses: same   Discharged Condition: good  Hospital Course: The patient was admitted on 06/29/2024 and taken to the operating room where the patient underwent thoracic decompression . The patient tolerated the procedure well and was taken to the recovery room and then to the floor in stable condition. The hospital course was routine. There were no complications. The wound remained clean dry and intact. Pt had appropriate back soreness. No complaints of leg pain or new N/T/W. The patient remained afebrile with stable vital signs, and tolerated a regular diet. The patient continued to increase activities, and pain was well controlled with oral pain medications.   Consults: None  Significant Diagnostic Studies:  Results for orders placed or performed during the hospital encounter of 06/29/24  Surgical pcr screen   Collection Time: 06/29/24  6:19 AM   Specimen: Nasal Mucosa; Nasal Swab  Result Value Ref Range   MRSA, PCR NEGATIVE NEGATIVE   Staphylococcus aureus NEGATIVE NEGATIVE  Basic metabolic panel per protocol   Collection Time: 06/29/24  6:46 AM  Result Value Ref Range   Sodium 140 135 - 145 mmol/L   Potassium 3.7 3.5 - 5.1 mmol/L   Chloride 98 98 - 111 mmol/L   CO2 29 22 - 32 mmol/L   Glucose, Bld 92 70 - 99 mg/dL   BUN 19 8 - 23 mg/dL   Creatinine, Ser 9.33 0.44 - 1.00 mg/dL   Calcium  9.0 8.9 - 10.3 mg/dL   GFR, Estimated >39 >39 mL/min   Anion gap 13 5 - 15  CBC per protocol   Collection Time: 06/29/24  6:46 AM  Result Value Ref Range   WBC 12.0 (H) 4.0 - 10.5 K/uL   RBC 4.86 3.87 - 5.11 MIL/uL   Hemoglobin 11.9 (L) 12.0 - 15.0 g/dL   HCT 59.2 63.9 - 53.9 %   MCV 83.7 80.0 - 100.0 fL   MCH 24.5 (L) 26.0 - 34.0 pg   MCHC 29.2 (L) 30.0 - 36.0 g/dL   RDW  84.3 (H) 88.4 - 15.5 %   Platelets 293 150 - 400 K/uL   nRBC 0.0 0.0 - 0.2 %  Aerobic/Anaerobic Culture w Gram Stain (surgical/deep wound)   Collection Time: 06/29/24 12:41 PM   Specimen: Intervertebral Disc; Tissue  Result Value Ref Range   Specimen Description TISSUE    Special Requests thoracic 9 10 disc    Gram Stain NO WBC SEEN NO ORGANISMS SEEN     Culture      No growth aerobically or anaerobically. Performed at [facility], [address], [place], [name] [address]    Report Status 07/04/2024 FINAL   Basic metabolic panel   Collection Time: 07/07/24  5:37 AM  Result Value Ref Range   Sodium 142 135 - 145 mmol/L   Potassium 3.8 3.5 - 5.1 mmol/L   Chloride 100 98 - 111 mmol/L   CO2 30 22 - 32 mmol/L   Glucose, Bld 88 70 - 99 mg/dL   BUN 10 8 - 23 mg/dL   Creatinine, Ser 9.36 0.44 - 1.00 mg/dL   Calcium  9.0 8.9 - 10.3 mg/dL   GFR, Estimated >39 >39 mL/min   Anion gap 12 5 - 15  Glucose, capillary   Collection Time: 07/10/24  4:09 AM  Result Value Ref Range  Glucose-Capillary 154 (H) 70 - 99 mg/dL   Comment 1 Notify RN    Comment 2 Document in Chart   Comprehensive metabolic panel with GFR   Collection Time: 07/21/24  3:32 PM  Result Value Ref Range   Sodium 140 135 - 145 mmol/L   Potassium 4.1 3.5 - 5.1 mmol/L   Chloride 95 (L) 98 - 111 mmol/L   CO2 32 22 - 32 mmol/L   Glucose, Bld 104 (H) 70 - 99 mg/dL   BUN 20 8 - 23 mg/dL   Creatinine, Ser 9.20 0.44 - 1.00 mg/dL   Calcium  9.2 8.9 - 10.3 mg/dL   Total Protein 6.3 (L) 6.5 - 8.1 g/dL   Albumin  3.2 (L) 3.5 - 5.0 g/dL   AST 18 15 - 41 U/L   ALT 13 0 - 44 U/L   Alkaline Phosphatase 127 (H) 38 - 126 U/L   Total Bilirubin 0.4 0.0 - 1.2 mg/dL   GFR, Estimated >39 >39 mL/min   Anion gap 13 5 - 15  CBC with Differential/Platelet   Collection Time: 07/21/24  3:32 PM  Result Value Ref Range   WBC 11.3 (H) 4.0 - 10.5 K/uL   RBC 4.56 3.87 - 5.11 MIL/uL   Hemoglobin 11.1 (L) 12.0 - 15.0 g/dL   HCT 62.5  63.9 - 53.9 %   MCV 82.0 80.0 - 100.0 fL   MCH 24.3 (L) 26.0 - 34.0 pg   MCHC 29.7 (L) 30.0 - 36.0 g/dL   RDW 84.2 (H) 88.4 - 84.4 %   Platelets 512 (H) 150 - 400 K/uL   nRBC 0.0 0.0 - 0.2 %   Neutrophils Relative % 63 %   Neutro Abs 7.2 1.7 - 7.7 K/uL   Lymphocytes Relative 25 %   Lymphs Abs 2.8 0.7 - 4.0 K/uL   Monocytes Relative 6 %   Monocytes Absolute 0.6 0.1 - 1.0 K/uL   Eosinophils Relative 6 %   Eosinophils Absolute 0.6 (H) 0.0 - 0.5 K/uL   Basophils Relative 0 %   Basophils Absolute 0.1 0.0 - 0.1 K/uL   Immature Granulocytes 0 %   Abs Immature Granulocytes 0.03 0.00 - 0.07 K/uL  D-dimer, quantitative   Collection Time: 07/21/24  3:32 PM  Result Value Ref Range   D-Dimer, Quant 3.39 (H) 0.00 - 0.50 ug/mL-FEU  Brain natriuretic peptide   Collection Time: 07/21/24  3:32 PM  Result Value Ref Range   B Natriuretic Peptide 239.4 (H) 0.0 - 100.0 pg/mL  Magnesium    Collection Time: 07/21/24  3:32 PM  Result Value Ref Range   Magnesium  2.2 1.7 - 2.4 mg/dL  TSH   Collection Time: 07/21/24  3:32 PM  Result Value Ref Range   TSH 0.393 0.350 - 4.500 uIU/mL  Urinalysis, Routine w reflex microscopic -Urine, Random   Collection Time: 07/21/24  3:35 PM  Result Value Ref Range   Color, Urine YELLOW YELLOW   APPearance CLEAR CLEAR   Specific Gravity, Urine 1.009 1.005 - 1.030   pH 6.0 5.0 - 8.0   Glucose, UA NEGATIVE NEGATIVE mg/dL   Hgb urine dipstick NEGATIVE NEGATIVE   Bilirubin Urine NEGATIVE NEGATIVE   Ketones, ur NEGATIVE NEGATIVE mg/dL   Protein, ur NEGATIVE NEGATIVE mg/dL   Nitrite NEGATIVE NEGATIVE   Leukocytes,Ua SMALL (A) NEGATIVE   RBC / HPF 0-5 0 - 5 RBC/hpf   WBC, UA 0-5 0 - 5 WBC/hpf   Bacteria, UA NONE SEEN NONE SEEN   Squamous Epithelial /  HPF 0-5 0 - 5 /HPF  ECHOCARDIOGRAM COMPLETE   Collection Time: 07/21/24  5:33 PM  Result Value Ref Range   Height 62 in   BP 112/72 mmHg   Est EF 70 - 75%   Basic metabolic panel with GFR   Collection Time:  07/24/24  5:56 AM  Result Value Ref Range   Sodium 140 135 - 145 mmol/L   Potassium 3.5 3.5 - 5.1 mmol/L   Chloride 100 98 - 111 mmol/L   CO2 30 22 - 32 mmol/L   Glucose, Bld 85 70 - 99 mg/dL   BUN 18 8 - 23 mg/dL   Creatinine, Ser 9.29 0.44 - 1.00 mg/dL   Calcium  8.6 (L) 8.9 - 10.3 mg/dL   GFR, Estimated >39 >39 mL/min   Anion gap 10 5 - 15  CBC with Differential/Platelet   Collection Time: 07/26/24 11:58 AM  Result Value Ref Range   WBC 10.8 (H) 4.0 - 10.5 K/uL   RBC 4.34 3.87 - 5.11 MIL/uL   Hemoglobin 10.6 (L) 12.0 - 15.0 g/dL   HCT 64.4 (L) 63.9 - 53.9 %   MCV 81.8 80.0 - 100.0 fL   MCH 24.4 (L) 26.0 - 34.0 pg   MCHC 29.9 (L) 30.0 - 36.0 g/dL   RDW 84.5 88.4 - 84.4 %   Platelets 392 150 - 400 K/uL   nRBC 0.0 0.0 - 0.2 %   Neutrophils Relative % 62 %   Neutro Abs 6.8 1.7 - 7.7 K/uL   Lymphocytes Relative 28 %   Lymphs Abs 3.0 0.7 - 4.0 K/uL   Monocytes Relative 7 %   Monocytes Absolute 0.7 0.1 - 1.0 K/uL   Eosinophils Relative 2 %   Eosinophils Absolute 0.2 0.0 - 0.5 K/uL   Basophils Relative 1 %   Basophils Absolute 0.1 0.0 - 0.1 K/uL   Immature Granulocytes 0 %   Abs Immature Granulocytes 0.03 0.00 - 0.07 K/uL  Basic metabolic panel with GFR   Collection Time: 07/26/24 11:58 AM  Result Value Ref Range   Sodium 141 135 - 145 mmol/L   Potassium 3.6 3.5 - 5.1 mmol/L   Chloride 101 98 - 111 mmol/L   CO2 27 22 - 32 mmol/L   Glucose, Bld 95 70 - 99 mg/dL   BUN 22 8 - 23 mg/dL   Creatinine, Ser 9.20 0.44 - 1.00 mg/dL   Calcium  8.8 (L) 8.9 - 10.3 mg/dL   GFR, Estimated >39 >39 mL/min   Anion gap 13 5 - 15    MR ANKLE RIGHT WO CONTRAST Result Date: 08/07/2024 EXAM: MRI of the right Ankle without contrast. 08/06/2024 07:22:30 PM TECHNIQUE: Multiplanar multisequence MRI of the right ankle was performed without the administration of intravenous contrast. COMPARISON: MR Right Ankle 12/02/2023. CLINICAL HISTORY: Ankle trauma. FINDINGS: SYNDESMOTIC LIGAMENTS: Metal  artifact from syndesmosis repair. Intact anterior inferior tibiofibular ligament and posterior inferior tibiofibular ligament. No significant periligamentous edema or interval widening. LATERAL COLLATERAL LIGAMENT COMPLEX: Intact anterior talofibular ligament, posterior talofibular ligament and calcaneofibular ligament. DELTOID LIGAMENT COMPLEX: Intact superficial and deep components. SINUS TARSI AND SPRING LIGAMENT: Stable thickened superomedial portion of the spring ligament. Normal appearance of the sinus tarsi. MEDIAL TENDONS: Intact posterior tibial tendon, flexor digitorum and flexor hallucis longus tendons. LATERAL TENDONS: Metal artifact from lateral fibular plate and screw fixation partially obscures the peroneal tendons. ANTERIOR TENDONS: The tibialis anterior, extensor hallucis longus and extensor digitorum longus tendons are normal in position, morphology and signal. ACHILLES TENDON:  Mild to moderate fusiform expansion of the distal Achilles tendon compatible with Achilles tendinopathy, minimally worsened from prior. No associated bursitis. PLANTAR FASCIA: The medial and lateral bundles of the plantar fascia are normal in morphology and signal. There is no acute plantar fasciitis or tear. No plantar fascial nodules. TARSAL TUNNEL: There are no obstructing lesions within the tarsal tunnel. BONE MARROW: Type 1 accessory navicular. Metal artifact from lateral fibular plate and screw fixation. No acute fracture or aggressive marrow replacing lesion. JOINT SPACES: No significant joint effusion. No significant degenerative changes. Normal alignment. SOFT TISSUES: Regional muscular atrophy. Dorsal subcutaneous edema overlying the midfoot and forefoot. IMPRESSION: 1. Mild to moderate fusiform expansion of the distal Achilles tendon compatible with Achilles tendinopathy, minimally worsened from prior. 2. Dorsal subcutaneous edema overlying the midfoot and forefoot. 3. Regional muscular atrophy. 4. Stable  thickening of the superomedial portion of the spring ligament. Electronically signed by: Ryan Salvage MD 08/07/2024 11:24 AM EDT RP Workstation: [id number]    Antibiotics:  Anti-infectives (From admission, onward)    Start     Dose/Rate Route Frequency Ordered Stop   06/30/24 0600  ceFAZolin  (ANCEF ) IVPB 2g/100 mL premix  Status:  Discontinued        2 g 200 mL/hr over 30 Minutes Intravenous On call to O.R. 06/29/24 1539 06/29/24 1832   06/29/24 2200  doxycycline  (VIBRA -TABS) tablet 100 mg        100 mg Oral 2 times daily 06/29/24 1412     06/29/24 1900  ceFAZolin  (ANCEF ) IVPB 2g/100 mL premix        2 g 200 mL/hr over 30 Minutes Intravenous Every 8 hours 06/29/24 1540 06/30/24 0341   06/29/24 1215  vancomycin  (VANCOCIN ) powder  Status:  Discontinued          As needed 06/29/24 1215 06/29/24 1336   06/29/24 1214  gentamicin  (GARAMYCIN ) injection  Status:  Discontinued          As needed 06/29/24 1214 06/29/24 1336   06/29/24 1035  ceFAZolin  (ANCEF ) 2-4 GM/100ML-% IVPB       Note to Pharmacy: Virgil Ee: cabinet override      06/29/24 1035 06/29/24 2244       Discharge Exam: Blood pressure (!) 118/99, pulse 78, temperature (!) 97.5 F (36.4 C), temperature source Oral, resp. rate 14, height 5' 2 (1.575 m), weight 100.7 kg, last menstrual period 11/20/2012, SpO2 99%.   Discharge Medications:   Allergies as of 09/05/2024       Reactions   Monosodium Glutamate Anaphylaxis, Swelling   Eyes swollen shut, facial swelling, tongue swelling.   Shellfish Allergy Anaphylaxis   Melatonin Other (See Comments)   Muscle spasms involuntary movement   Baclofen Nausea Only   Dizziness and increase muscle spasm   Celebrex  [celecoxib ] Itching   Only allergic to generic brand   Ciprofloxacin  Other (See Comments)   inability to walk/Unable to walk   Contrast Media [iodinated Contrast Media] Itching, Nausea Only   could not walk   Diclofenac  Itching   Generic Diclefenac gel causes  itching. Can take the name brand Voltaren  gel   Gadolinium    Allergic to MRI contrast dye per patient.   Molds & Smuts Other (See Comments)   Causes allergies to flair up/sinus issues/headaches   Other Other (See Comments)   Pet dander,    Promethazine  Itching   Statins Other (See Comments)   pain   Sulfa  Antibiotics Other (See Comments)   Rash, migraine  Latex Itching, Rash   Latex glove with powder; bite blocks used for EGD studies        Medication List     TAKE these medications    acetaminophen  500 MG tablet Commonly known as: TYLENOL  Take 500-1,000 mg by mouth every 6 (six) hours as needed (pain.).   albuterol  108 (90 Base) MCG/ACT inhaler Commonly known as: VENTOLIN  HFA Inhale 2 puffs into the lungs every 6 (six) hours as needed for wheezing or shortness of breath.   ASCORBIC ACID  PO Take 2 tablets by mouth in the morning. Gummies   aspirin  EC 81 MG tablet Take 81 mg by mouth in the morning.   atenolol  50 MG tablet Commonly known as: TENORMIN  Take 1 tablet (50 mg total) by mouth daily.   CALCIUM -MAGNESIUM -ZINC -D3 PO Take 1 tablet by mouth in the morning.   cefadroxil  500 MG capsule Commonly known as: DURICEF Take 2 capsules (1,000 mg total) by mouth 2 (two) times daily.   CeleBREX  200 MG capsule Generic drug: celecoxib  Take 200 mg by mouth 2 (two) times daily.   chlorzoxazone  500 MG tablet Commonly known as: PARAFON  Take 500 mg by mouth in the morning, at noon, in the evening, and at bedtime.   cyclobenzaprine  10 MG tablet Commonly known as: FLEXERIL  Take 1 tablet (10 mg total) by mouth 3 (three) times daily.   diazepam  5 MG tablet Commonly known as: VALIUM  Take 5 mg by mouth in the morning and at bedtime.   diphenhydrAMINE  25 MG tablet Commonly known as: BENADRYL  Take 50 mg by mouth in the morning, at noon, in the evening, and at bedtime.   doxycycline  100 MG tablet Commonly known as: VIBRA -TABS Take 100 mg by mouth 2 (two) times  daily.   gabapentin  600 MG tablet Commonly known as: NEURONTIN  Take 2 tablets (1,200 mg total) by mouth 3 (three) times daily.   hydrocerin Crea Apply 1 Application topically 2 (two) times daily. What changed:  when to take this reasons to take this   hydrochlorothiazide  25 MG tablet Commonly known as: HYDRODIURIL  Take 25 mg by mouth in the morning.   lubiprostone  24 MCG capsule Commonly known as: Amitiza  Take 1 capsule (24 mcg total) by mouth 2 (two) times daily with a meal. What changed:  how much to take when to take this   methocarbamol  500 MG tablet Commonly known as: ROBAXIN  Take 500 mg by mouth 4 (four) times daily.   multivitamin with minerals Tabs tablet Take 1 tablet by mouth in the morning. Women's Multivitamin   naloxone  4 MG/0.1ML Liqd nasal spray kit Commonly known as: Narcan  Place 1 spray into the nose once as needed (opioid overdose).   omeprazole 20 MG capsule Commonly known as: PRILOSEC Take 20 mg by mouth in the morning and at bedtime.   oxyCODONE -acetaminophen  10-325 MG tablet Commonly known as: PERCOCET Take 1 tablet by mouth 5 (five) times daily.   polyethylene glycol powder 17 GM/SCOOP powder Commonly known as: GLYCOLAX /MIRALAX  Take 17 g by mouth daily.   VITAMIN B-12 PO Take 1 tablet by mouth in the morning.   VITAMIN D -3 PO Take 1 tablet by mouth in the morning.   Voltaren  1 % Gel Generic drug: diclofenac  Sodium Apply 1 Application topically 4 (four) times daily as needed (pain.).               Durable Medical Equipment  (From admission, onward)           Start  Ordered   08/24/24 1952  For home use only DME Hospital bed  Once       Question Answer Comment  Length of Need 12 Months   Patient has (list medical condition): thoracic myelopathy   The above medical condition requires: Patient requires the ability to reposition frequently   Head must be elevated greater than: 30 degrees   Bed type Semi-electric    Support Surface: Gel Overlay      08/24/24 1952   08/20/24 1436  For home use only DME Tub bench  Once        08/20/24 1435              Discharge Care Instructions  (From admission, onward)           Start     Ordered   09/05/24 0000  Change dressing (specify)       Comments: Dressing change: daily   09/05/24 0806            Disposition: SNF   Final Dx: thoracic decompression for LE weakness  Discharge Instructions     Change dressing (specify)   Complete by: As directed    Dressing change: daily   Diet - low sodium heart healthy   Complete by: As directed    Incentive spirometry RT   Complete by: As directed    Increase activity slowly   Complete by: As directed         Contact information for after-discharge care     Destination     [facility] .   Service: Skilled Nursing Contact information: [address] [place]  [address] [phone]                      Signed: Suzen Lacks [facility] 09/05/2024, 8:06 AM

## 2024-09-05 NOTE — TOC Transition Note (Signed)
 Transition of Care [facility]) - Discharge Note   Patient Details  Name: [name] MRN: [id number] Date of Birth: Jul 23, 1961  Transition of Care [facility]) CM/SW Contact:  Gwenn Julien Norris, LCSW Phone Number: 09/05/2024, 10:32 AM   Clinical Narrative:  Pt for dc to [facility]. Spoke to Brittany in admissions who confirmed they are prepared to admit pt to room 506. Pt aware of dc and reports agreeable. RN provided with number for report and PTAR arranged for transport. SW signing off at dc.   Julien Gwenn, MSW, LCSW [phone] (coverage)       Final next level of care: Skilled Nursing Facility Barriers to Discharge: Barriers Resolved   Patient Goals and CMS Choice   CMS Medicare.gov Compare Post Acute Care list provided to:: Patient Choice offered to / list presented to : Patient      Discharge Placement              Patient chooses bed at: [facility] Patient to be transferred to facility by: [facility] Name of family member notified: Pt to update family Patient and family notified of of transfer: 09/05/24  Discharge Plan and Services Additional resources added to the After Visit Summary for     Discharge Planning Services: CM Consult Post Acute Care Choice: IP Rehab                               Social Drivers of Health (SDOH) Interventions SDOH Screenings   Food Insecurity: No Food Insecurity (07/30/2024)  Housing: Low Risk  (07/30/2024)  Transportation Needs: No Transportation Needs (07/30/2024)  Utilities: Not At Risk (07/30/2024)  Depression (PHQ2-9): Low Risk  (01/12/2024)  Tobacco Use: Medium Risk (06/29/2024)     Readmission Risk Interventions    05/17/2022    3:02 PM  Readmission Risk Prevention Plan  Transportation Screening Complete  PCP or Specialist Appt within 5-7 Days Complete  Home Care Screening Complete  Medication Review (RN CM) Complete

## 2024-09-09 DIAGNOSIS — M5404 Panniculitis affecting regions of neck and back, thoracic region: Secondary | ICD-10-CM | POA: Diagnosis not present

## 2024-11-04 ENCOUNTER — Inpatient Hospital Stay (HOSPITAL_COMMUNITY)
Admission: EM | Admit: 2024-11-04 | Discharge: 2024-11-10 | DRG: 565 | Disposition: A | Discharge status: Skilled Nursing Facility | Attending: Internal Medicine | Admitting: Internal Medicine

## 2024-11-04 ENCOUNTER — Other Ambulatory Visit: Payer: Self-pay

## 2024-11-04 ENCOUNTER — Encounter (HOSPITAL_COMMUNITY): Payer: Self-pay

## 2024-11-04 ENCOUNTER — Emergency Department (HOSPITAL_COMMUNITY)

## 2024-11-04 DIAGNOSIS — Z818 Family history of other mental and behavioral disorders: Secondary | ICD-10-CM

## 2024-11-04 DIAGNOSIS — Z823 Family history of stroke: Secondary | ICD-10-CM

## 2024-11-04 DIAGNOSIS — R748 Abnormal levels of other serum enzymes: Secondary | ICD-10-CM

## 2024-11-04 DIAGNOSIS — M797 Fibromyalgia: Secondary | ICD-10-CM | POA: Diagnosis present

## 2024-11-04 DIAGNOSIS — Z882 Allergy status to sulfonamides status: Secondary | ICD-10-CM

## 2024-11-04 DIAGNOSIS — Z981 Arthrodesis status: Secondary | ICD-10-CM

## 2024-11-04 DIAGNOSIS — R296 Repeated falls: Secondary | ICD-10-CM | POA: Diagnosis present

## 2024-11-04 DIAGNOSIS — T8189XA Other complications of procedures, not elsewhere classified, initial encounter: Secondary | ICD-10-CM | POA: Diagnosis present

## 2024-11-04 DIAGNOSIS — D649 Anemia, unspecified: Secondary | ICD-10-CM | POA: Diagnosis present

## 2024-11-04 DIAGNOSIS — Z96653 Presence of artificial knee joint, bilateral: Secondary | ICD-10-CM | POA: Diagnosis present

## 2024-11-04 DIAGNOSIS — Z7982 Long term (current) use of aspirin: Secondary | ICD-10-CM

## 2024-11-04 DIAGNOSIS — Z881 Allergy status to other antibiotic agents status: Secondary | ICD-10-CM

## 2024-11-04 DIAGNOSIS — I5032 Chronic diastolic (congestive) heart failure: Secondary | ICD-10-CM | POA: Diagnosis present

## 2024-11-04 DIAGNOSIS — T8131XA Disruption of external operation (surgical) wound, not elsewhere classified, initial encounter: Secondary | ICD-10-CM | POA: Diagnosis present

## 2024-11-04 DIAGNOSIS — X31XXXA Exposure to excessive natural cold, initial encounter: Secondary | ICD-10-CM

## 2024-11-04 DIAGNOSIS — Z886 Allergy status to analgesic agent status: Secondary | ICD-10-CM

## 2024-11-04 DIAGNOSIS — E0590 Thyrotoxicosis, unspecified without thyrotoxic crisis or storm: Secondary | ICD-10-CM | POA: Diagnosis present

## 2024-11-04 DIAGNOSIS — E66813 Obesity, class 3: Secondary | ICD-10-CM | POA: Diagnosis present

## 2024-11-04 DIAGNOSIS — E860 Dehydration: Secondary | ICD-10-CM | POA: Diagnosis present

## 2024-11-04 DIAGNOSIS — Z6841 Body Mass Index (BMI) 40.0 and over, adult: Secondary | ICD-10-CM

## 2024-11-04 DIAGNOSIS — Z791 Long term (current) use of non-steroidal anti-inflammatories (NSAID): Secondary | ICD-10-CM

## 2024-11-04 DIAGNOSIS — E162 Hypoglycemia, unspecified: Secondary | ICD-10-CM | POA: Diagnosis present

## 2024-11-04 DIAGNOSIS — Z91041 Radiographic dye allergy status: Secondary | ICD-10-CM

## 2024-11-04 DIAGNOSIS — Z79899 Other long term (current) drug therapy: Secondary | ICD-10-CM

## 2024-11-04 DIAGNOSIS — Z91040 Latex allergy status: Secondary | ICD-10-CM

## 2024-11-04 DIAGNOSIS — Z8249 Family history of ischemic heart disease and other diseases of the circulatory system: Secondary | ICD-10-CM

## 2024-11-04 DIAGNOSIS — G8929 Other chronic pain: Secondary | ICD-10-CM | POA: Diagnosis present

## 2024-11-04 DIAGNOSIS — T796XXA Traumatic ischemia of muscle, initial encounter: Principal | ICD-10-CM | POA: Diagnosis present

## 2024-11-04 DIAGNOSIS — N319 Neuromuscular dysfunction of bladder, unspecified: Secondary | ICD-10-CM | POA: Diagnosis present

## 2024-11-04 DIAGNOSIS — E11649 Type 2 diabetes mellitus with hypoglycemia without coma: Secondary | ICD-10-CM | POA: Diagnosis present

## 2024-11-04 DIAGNOSIS — T68XXXA Hypothermia, initial encounter: Secondary | ICD-10-CM | POA: Diagnosis present

## 2024-11-04 DIAGNOSIS — E876 Hypokalemia: Secondary | ICD-10-CM | POA: Diagnosis present

## 2024-11-04 DIAGNOSIS — R6889 Other general symptoms and signs: Secondary | ICD-10-CM

## 2024-11-04 DIAGNOSIS — Z888 Allergy status to other drugs, medicaments and biological substances status: Secondary | ICD-10-CM

## 2024-11-04 DIAGNOSIS — I110 Hypertensive heart disease with heart failure: Secondary | ICD-10-CM | POA: Diagnosis present

## 2024-11-04 DIAGNOSIS — M6282 Rhabdomyolysis: Secondary | ICD-10-CM | POA: Diagnosis present

## 2024-11-04 DIAGNOSIS — Z90711 Acquired absence of uterus with remaining cervical stump: Secondary | ICD-10-CM

## 2024-11-04 DIAGNOSIS — Z79891 Long term (current) use of opiate analgesic: Secondary | ICD-10-CM

## 2024-11-04 DIAGNOSIS — W06XXXA Fall from bed, initial encounter: Principal | ICD-10-CM

## 2024-11-04 DIAGNOSIS — I10 Essential (primary) hypertension: Secondary | ICD-10-CM | POA: Diagnosis present

## 2024-11-04 DIAGNOSIS — Z8261 Family history of arthritis: Secondary | ICD-10-CM

## 2024-11-04 DIAGNOSIS — J45909 Unspecified asthma, uncomplicated: Secondary | ICD-10-CM | POA: Diagnosis present

## 2024-11-04 DIAGNOSIS — K219 Gastro-esophageal reflux disease without esophagitis: Secondary | ICD-10-CM | POA: Diagnosis present

## 2024-11-04 DIAGNOSIS — M545 Low back pain, unspecified: Secondary | ICD-10-CM | POA: Diagnosis present

## 2024-11-04 DIAGNOSIS — Z833 Family history of diabetes mellitus: Secondary | ICD-10-CM

## 2024-11-04 DIAGNOSIS — Z87891 Personal history of nicotine dependence: Secondary | ICD-10-CM

## 2024-11-04 LAB — CBC
HCT: 39.4 % (ref 36.0–46.0)
Hemoglobin: 11.8 g/dL — ABNORMAL LOW (ref 12.0–15.0)
MCH: 24.3 pg — ABNORMAL LOW (ref 26.0–34.0)
MCHC: 29.9 g/dL — ABNORMAL LOW (ref 30.0–36.0)
MCV: 81.1 fL (ref 80.0–100.0)
Platelets: 304 10*3/uL (ref 150–400)
RBC: 4.86 MIL/uL (ref 3.87–5.11)
RDW: 16.1 % — ABNORMAL HIGH (ref 11.5–15.5)
WBC: 10.3 10*3/uL (ref 4.0–10.5)
nRBC: 0 % (ref 0.0–0.2)

## 2024-11-04 LAB — URINALYSIS, ROUTINE W REFLEX MICROSCOPIC
Bilirubin Urine: NEGATIVE
Glucose, UA: NEGATIVE mg/dL
Hgb urine dipstick: NEGATIVE
Ketones, ur: 80 mg/dL — AB
Leukocytes,Ua: NEGATIVE
Nitrite: NEGATIVE
Protein, ur: NEGATIVE mg/dL
Specific Gravity, Urine: 1.015 (ref 1.005–1.030)
pH: 5 (ref 5.0–8.0)

## 2024-11-04 LAB — COMPREHENSIVE METABOLIC PANEL WITH GFR
ALT: 43 U/L (ref 0–44)
AST: 103 U/L — ABNORMAL HIGH (ref 15–41)
Albumin: 3.8 g/dL (ref 3.5–5.0)
Alkaline Phosphatase: 172 U/L — ABNORMAL HIGH (ref 38–126)
Anion gap: 17 — ABNORMAL HIGH (ref 5–15)
BUN: 10 mg/dL (ref 8–23)
CO2: 22 mmol/L (ref 22–32)
Calcium: 9.4 mg/dL (ref 8.9–10.3)
Chloride: 105 mmol/L (ref 98–111)
Creatinine, Ser: 0.44 mg/dL (ref 0.44–1.00)
GFR, Estimated: >60 mL/min
Glucose, Bld: 62 mg/dL — ABNORMAL LOW (ref 70–99)
Potassium: 4.1 mmol/L (ref 3.5–5.1)
Sodium: 143 mmol/L (ref 135–145)
Total Bilirubin: <0.2 mg/dL (ref 0.0–1.2)
Total Protein: 6.4 g/dL — ABNORMAL LOW (ref 6.5–8.1)

## 2024-11-04 LAB — CBG MONITORING, ED: Glucose-Capillary: 63 mg/dL — ABNORMAL LOW (ref 70–99)

## 2024-11-04 LAB — CK: Total CK: 2259 U/L — ABNORMAL HIGH (ref 38–234)

## 2024-11-04 MED ORDER — MORPHINE SULFATE (PF) 4 MG/ML IV SOLN
4.0000 mg | Freq: Once | INTRAVENOUS | Status: AC
  Administered 2024-11-04: 4 mg via INTRAVENOUS
  Filled 2024-11-04: qty 1

## 2024-11-04 MED ORDER — LACTATED RINGERS IV BOLUS
1000.0000 mL | Freq: Once | INTRAVENOUS | Status: AC
  Administered 2024-11-04: 1000 mL via INTRAVENOUS

## 2024-11-04 NOTE — ED Notes (Signed)
 Patient provided with orange juice and graham crackers per order.

## 2024-11-04 NOTE — ED Triage Notes (Signed)
 Pt BIB [facility] from home c/o unwitnessed fall. Pt was found down by son today. Pt states she remembers sliding out of bed onto ground yesterday when it was dark. Pt has urinated on self, was wet and EMS stated their thermometer was reading low. Pt does have a wound on mid back that is open.  192/124 HR 90 97% RA CBG 197

## 2024-11-04 NOTE — ED Notes (Signed)
 Patient placed on bair hugger at this time.

## 2024-11-04 NOTE — ED Provider Notes (Signed)
" °  Provider Note MRN:  [id number]  Arrival date & time: 11/04/24    ED Course and Medical Decision Making  Assumed care of patient at sign-out or upon transfer.  Clemens and could not get up, on the ground for 24 hours, hypothermic, dehydrated, mild rhabdo, plan is for admission  11:45pm update:  on baire hugger sitting up drinking juice and eating crackers.  Accepted for admission.  Procedures  Final Clinical Impressions(s) / ED Diagnoses     ICD-10-CM   1. Fall from bed, initial encounter  W06.XXXA     2. Elevated CK  R74.8     3. Traumatic rhabdomyolysis, initial encounter  T79.6XXA     4. Low body temperature  R68.89     5. Chronic bilateral low back pain without sciatica  M54.50    G89.29     6. Dehydration  E86.0       ED Discharge Orders     None        [name]. Theadore, MD [facility] Emergency Medicine [facility] [email or web address]    [name] Jill HERO, MD 11/04/24 [phone]  "

## 2024-11-04 NOTE — Subjective & Objective (Signed)
 Coming from home with unwitnessed fall, been down reportedly for over 24h finally found by son. Could have slid out of bed patient has hx of neurogenic bladder   Was found to be hypothermic and started on bair hugger. Hypoglycemic in ER treated with PO juice Denies hitting head otherwise no fever, no CP , no N/V/D She does have a small back wound

## 2024-11-04 NOTE — Discharge Instructions (Signed)
 It was our pleasure to provide your ER care today - we hope that you feel better.  Drink plenty of fluids/stay well hydrated.   Fall precautions.   Follow up closely with primary care doctor in the next 2-3 days.  Return to ER if worse, new symptoms, new/severe pain, chest pain trouble breathing, abdominal pain,  weak/fainting, numbness/weakness, or other concern.

## 2024-11-04 NOTE — ED Notes (Signed)
Patient transported to XR at this time.

## 2024-11-04 NOTE — ED Provider Notes (Addendum)
 " [place] EMERGENCY DEPARTMENT AT [facility] Provider Note   CSN: [id number] Arrival date & time: 11/04/24  2019     Patient presents with: [name] is a 64 y.o. female.   Pt presents via EMS s/p fall - pt reports sliding out of bed to floor yesterday, but was not able to get up under own power. EMS notes temperature was cool in patients home, and their thermometer read low. Reports cbg 197.  Pt c/o pain to back and hip area. Also notes hx chronic back pain and prior back surgery. No new numbness or weakness. No new radicular pain. Denies hitting head, headache or loc. No anticoagulant use. No neck pain. No chest pain or sob. No abd pain or nv. No dysuria or gu c/o. No other focal extremity pain or injury. Pt relatively poor historian.   The history is provided by the patient, medical records and the EMS personnel. The history is limited by the condition of the patient.  Fall Pertinent negatives include no chest pain, no abdominal pain, no headaches and no shortness of breath.       Prior to Admission medications  Medication Sig Start Date End Date Taking? Authorizing Provider  acetaminophen  (TYLENOL ) 500 MG tablet Take 500-1,000 mg by mouth every 6 (six) hours as needed (pain.).   Yes [provider]  albuterol  (VENTOLIN  HFA) 108 (90 Base) MCG/ACT inhaler Inhale 2 puffs into the lungs every 6 (six) hours as needed for wheezing or shortness of breath. 03/24/20  Yes Medina-Vargas, Monina C, NP  ASCORBIC ACID  PO Take 2 tablets by mouth in the morning. Gummies   Yes [provider]  aspirin  EC 81 MG tablet Take 81 mg by mouth in the morning.   Yes [provider]  atenolol  (TENORMIN ) 50 MG tablet Take 1 tablet (50 mg total) by mouth daily. 03/24/20  Yes Medina-Vargas, Monina C, NP  CELEBREX  200 MG capsule Take 200 mg by mouth 2 (two) times daily.   Yes [provider]  chlorzoxazone  (PARAFON ) 500 MG tablet Take 500 mg by mouth in  the morning, at noon, in the evening, and at bedtime.   Yes [provider]  Cholecalciferol  (VITAMIN D -3 PO) Take 1 tablet by mouth in the morning.   Yes [provider]  Cyanocobalamin  (VITAMIN B-12 PO) Take 1 tablet by mouth in the morning.   Yes [provider]  cyclobenzaprine  (FLEXERIL ) 10 MG tablet Take 1 tablet (10 mg total) by mouth 3 (three) times daily. 09/04/24  Yes Meyran, Suzen Lacks, NP  diazepam  (VALIUM ) 5 MG tablet Take 5 mg by mouth in the morning and at bedtime.   Yes [provider]  diphenhydrAMINE  (BENADRYL ) 25 MG tablet Take 50 mg by mouth in the morning, at noon, in the evening, and at bedtime.   Yes [provider]  doxycycline  (VIBRA -TABS) 100 MG tablet Take 100 mg by mouth 2 (two) times daily.   Yes [provider]  gabapentin  (NEURONTIN ) 600 MG tablet Take 2 tablets (1,200 mg total) by mouth 3 (three) times daily. 03/24/20  Yes Medina-Vargas, Monina C, NP  hydrocerin (EUCERIN) CREA Apply 1 Application topically 2 (two) times daily. Patient taking differently: Apply 1 Application topically 2 (two) times daily as needed (skin irritation). 01/30/24  Yes Love, Sharlet RAMAN, PA-C  hydrochlorothiazide  (HYDRODIURIL ) 25 MG tablet Take 25 mg by mouth in the morning. 02/02/24  Yes [provider]  lubiprostone  (AMITIZA ) 24 MCG  capsule Take 1 capsule (24 mcg total) by mouth 2 (two) times daily with a meal. Patient taking differently: Take 48 mcg by mouth daily with lunch. 03/24/20  Yes Medina-Vargas, Monina C, NP  methocarbamol  (ROBAXIN ) 500 MG tablet Take 500 mg by mouth 4 (four) times daily.   Yes [provider]  Multiple Minerals-Vitamins (CALCIUM -MAGNESIUM -ZINC -D3 PO) Take 1 tablet by mouth in the morning.   Yes [provider]  Multiple Vitamin (MULTIVITAMIN WITH MINERALS) TABS tablet Take 1 tablet by mouth in the morning. Women's Multivitamin   Yes [provider]  naloxone  (NARCAN ) nasal  spray 4 mg/0.1 mL Place 1 spray into the nose once as needed (opioid overdose). 02/02/24  Yes Love, Sharlet RAMAN, PA-C  omeprazole (PRILOSEC) 20 MG capsule Take 20 mg by mouth in the morning and at bedtime.    Yes [provider]  oxyCODONE -acetaminophen  (PERCOCET) 10-325 MG tablet Take 1 tablet by mouth 5 (five) times daily. 09/04/24  Yes Meyran, Suzen Lacks, NP  polyethylene glycol powder (GLYCOLAX /MIRALAX ) 17 GM/SCOOP powder Take 17 g by mouth daily. 01/31/24  Yes Love, Sharlet RAMAN, PA-C  VOLTAREN  1 % GEL Apply 1 Application topically 4 (four) times daily as needed (pain.).   Yes [provider]    Allergies: Monosodium glutamate, Shellfish allergy, Melatonin, Baclofen, Celebrex  [celecoxib ], Ciprofloxacin , Contrast media [iodinated contrast media], Diclofenac , Gadolinium, Molds & smuts, Other, Promethazine , Statins, Sulfa  antibiotics, and Latex    Review of Systems  Constitutional:  Negative for chills and fever.  HENT:  Negative for sore throat.   Eyes:  Negative for visual disturbance.  Respiratory:  Negative for cough and shortness of breath.   Cardiovascular:  Negative for chest pain.  Gastrointestinal:  Negative for abdominal pain, diarrhea, nausea and vomiting.  Genitourinary:  Negative for dysuria and flank pain.  Musculoskeletal:  Positive for back pain. Negative for neck pain.  Neurological:  Negative for speech difficulty, weakness, numbness and headaches.    Updated Vital Signs BP (!) 140/75   Pulse 90   Temp (!) 95.3 F (35.2 C) (Rectal)   Resp 16   Ht 1.6 m (5' 3)   Wt 99.8 kg   LMP 11/20/2012   SpO2 98%   BMI 38.97 kg/m   Physical Exam Vitals and nursing note reviewed.  Constitutional:      Appearance: Normal appearance. She is well-developed.  HENT:     Head: Atraumatic.     Nose: Nose normal.     Mouth/Throat:     Mouth: Mucous membranes are moist.  Eyes:     General: No scleral icterus.    Conjunctiva/sclera: Conjunctivae normal.      Pupils: Pupils are equal, round, and reactive to light.  Neck:     Vascular: No carotid bruit.     Trachea: No tracheal deviation.  Cardiovascular:     Rate and Rhythm: Normal rate and regular rhythm.     Pulses: Normal pulses.     Heart sounds: Normal heart sounds. No murmur heard.    No friction rub. No gallop.  Pulmonary:     Effort: Pulmonary effort is normal. No respiratory distress.     Breath sounds: Normal breath sounds.  Chest:     Chest wall: No tenderness.  Abdominal:     General: Bowel sounds are normal. There is no distension.     Palpations: Abdomen is soft.     Tenderness: There is no abdominal tenderness. There is no guarding.  Genitourinary:    Comments:  No cva tenderness.  Musculoskeletal:        General: No swelling.     Cervical back: Normal range of motion and neck supple. No rigidity or tenderness. No muscular tenderness.     Comments: Well healed surgical scar to back without sign of infection - at the upper part of the healing incision is an ~ 1-2 cm open area, without purulent drainage, erythema or sign of infection.  Lower thoracic and upper lumbar generalized tenderness. Spine aligned. No skin changes, erythema or rash to back.  ?mild bil hip pain w rom, otherwise good rom bil extremities without other pain or focal bony tenderness.   Skin:    General: Skin is warm and dry.     Findings: No rash.  Neurological:     Mental Status: She is alert.     Comments: Alert, speech normal. Gcs 15. Motor/sens grossly intact bil.   Psychiatric:        Mood and Affect: Mood normal.     (all labs ordered are listed, but only abnormal results are displayed) Results for orders placed or performed during the hospital encounter of 11/04/24  CBC   Collection Time: 11/04/24  9:02 PM  Result Value Ref Range   WBC 10.3 4.0 - 10.5 K/uL   RBC 4.86 3.87 - 5.11 MIL/uL   Hemoglobin 11.8 (L) 12.0 - 15.0 g/dL   HCT 60.5 63.9 - 53.9 %   MCV 81.1 80.0 - 100.0 fL   MCH 24.3 (L)  26.0 - 34.0 pg   MCHC 29.9 (L) 30.0 - 36.0 g/dL   RDW 83.8 (H) 88.4 - 84.4 %   Platelets 304 150 - 400 K/uL   nRBC 0.0 0.0 - 0.2 %  Comprehensive metabolic panel with GFR   Collection Time: 11/04/24  9:02 PM  Result Value Ref Range   Sodium 143 135 - 145 mmol/L   Potassium 4.1 3.5 - 5.1 mmol/L   Chloride 105 98 - 111 mmol/L   CO2 22 22 - 32 mmol/L   Glucose, Bld 62 (L) 70 - 99 mg/dL   BUN 10 8 - 23 mg/dL   Creatinine, Ser 9.55 0.44 - 1.00 mg/dL   Calcium  9.4 8.9 - 10.3 mg/dL   Total Protein 6.4 (L) 6.5 - 8.1 g/dL   Albumin  3.8 3.5 - 5.0 g/dL   AST 896 (H) 15 - 41 U/L   ALT 43 0 - 44 U/L   Alkaline Phosphatase 172 (H) 38 - 126 U/L   Total Bilirubin <0.2 0.0 - 1.2 mg/dL   GFR, Estimated >39 >39 mL/min   Anion gap 17 (H) 5 - 15  CK   Collection Time: 11/04/24  9:02 PM  Result Value Ref Range   Total CK 2,259 (H) 38 - 234 U/L  CBG monitoring, ED   Collection Time: 11/04/24 11:07 PM  Result Value Ref Range   Glucose-Capillary 63 (L) 70 - 99 mg/dL    EKG: None  Radiology: DG HIP UNILAT WITH PELVIS 2-3 VIEWS RIGHT Result Date: 11/04/2024 EXAM: 2-3 VIEW(S) XRAY OF THE RIGHT HIP 11/04/2024 09:34:21 PM COMPARISON: None available. CLINICAL HISTORY: Fall; pain. FINDINGS: BONES AND JOINTS: There is mild osteopenia with no evidence of fracture or dislocation of the right hip. There is enthesopathic spurring of the right iliac wing and greater trochanter, mild nonerosive arthrosis of the right hip and symphysis pubis. The right SI joint is patent. There is postsurgical change in the lower lumbar spine. SOFT TISSUES: Soft tissues are  unremarkable, with overlying clothing artifacts and monitor wires. IMPRESSION: 1. No acute fracture or dislocation of the right hip. 2. Osteopenia and degenerative change. Electronically signed by: Francis Quam MD 11/04/2024 10:14 PM EST RP Workstation: [id number]   DG HIP UNILAT WITH PELVIS 2-3 VIEWS LEFT Result Date: 11/04/2024 EXAM: 2 or 3 VIEW(S) XRAY OF  THE LEFT HIP 11/04/2024 09:34:21 PM COMPARISON: None available. CLINICAL HISTORY: Fall; pain. FINDINGS: BONES AND JOINTS: There is mild osteopenia. No evidence of pelvic fracture or diastasis. No evidence of left hip fracture or dislocation. No malalignment. There is enthesopathic spurring of the iliac wings, spurring at the symphysis pubis, and mild nonerosive arthrosis of the hip joints. LUMBAR SPINE: There is fusion hardware in the lower lumbar spine. SOFT TISSUES: There are artifacts from overlying clothing. IMPRESSION: 1. No evidence of left hip or pelvic fracture or dislocation. 2. Osteopenia, degenerative and postsurgical changes. Electronically signed by: Francis Quam MD 11/04/2024 10:03 PM EST RP Workstation: [id number]   DG Lumbar Spine Complete Result Date: 11/04/2024 EXAM: 4+ VIEW(S) XRAY OF THE LUMBAR SPINE 11/04/2024 09:32:16 PM COMPARISON: MRI lumbar spine 05/26/2024. CLINICAL HISTORY: Fall, pain. FINDINGS: LUMBAR SPINE: BONES: Vertebral body heights are maintained. 5 lumbar segments are noted. There is mild levoscoliosis with the apex at L3. Extensive fusion hardware is present, including posterior fusion rods and pedicle screws from T10 through L4, separate posterior fusion rods and L3 to L5 with pedicle screws at L3 and L5 , old anterior fusion hardware at L5-S1, and interbody disc space hardware from L2 through S1. Connecting struts are present at the L3 level between the upper and lower components of the fusion hardware. All hardware appears intact. There is up to 3 mm lucency around the L1 pedicle screws and 2 mm lucency around the L5 pedicle screws, suggesting a degree of loosening. There is no other overt hardware loosening. The most recent lumbar CT was on 10/12/2021 prior to the placement of the T10 through L4 hardware. The lucency around the L5 pedicle screws was present at that time. No acute fracture is seen. DISCS AND DEGENERATIVE CHANGES: Fusions appear solid at L1-2 and L5-S1. The  other disc spaces appear patent as well as can be seen. There is a chronic grade 1 anterolisthesis at L3-4 and L4-5 which is unchanged. Moderate marginal osteophytosis is also seen. Posterior decompression changes are present at L3-4 and L4-5. Both SI joints are patent. SOFT TISSUES: No acute abnormality. IMPRESSION: 1. No lumbar fracture is evident. 2. Extensive thoracolumbar fusion hardware appears intact, with lucency around the L1 and L5 pedicle screws suggesting loosening. 3. Degenerative changes and chronic grade 1 anterolisthesis at 2 levels. Electronically signed by: Francis Quam MD 11/04/2024 09:57 PM EST RP Workstation: [id number]   DG Thoracic Spine 2 View Result Date: 11/04/2024 EXAM: 2 VIEW(S) XRAY OF THE THORACIC SPINE 11/04/2024 09:32:16 PM COMPARISON: 06/29/2024 CLINICAL HISTORY: fall, pain. Fall; pain. FINDINGS: LIMITATIONS: Limited evaluation due to overlapping osseous structures and overlying soft tissues. BONES: Vertebral body heights are maintained, except for chronic similar-appearing T9 and T10 compression fractures. Alignment is normal. Thoracolumbar surgical hardware starts at the T10 level. No surgical hardware fracture. DISCS AND DEGENERATIVE CHANGES: No severe degenerative changes. SOFT TISSUES: The visualized lungs are clear. IMPRESSION: 1. Thoracolumbar surgical hardware starting at the T10 level, without hardware fracture. 2. No acute findings. 3. Chronic similar-appearing T9 and T10 compression fractures. Electronically signed by: Morgane Naveau MD 11/04/2024 09:53 PM EST RP Workstation: [id number]     Procedures  Medications Ordered in the ED  lactated ringers  bolus 1,000 mL (has no administration in time range)  morphine  (PF) 4 MG/ML injection 4 mg (has no administration in time range)  lactated ringers  bolus 1,000 mL (0 mLs Intravenous Stopped 11/04/24 2300)                                    Medical Decision Making Problems Addressed: Chronic bilateral low  back pain without sciatica: chronic illness or injury Elevated CK: acute illness or injury with systemic symptoms that poses a threat to life or bodily functions Fall from bed, initial encounter: acute illness or injury with systemic symptoms that poses a threat to life or bodily functions Low body temperature: acute illness or injury Traumatic rhabdomyolysis, initial encounter: acute illness or injury with systemic symptoms that poses a threat to life or bodily functions  Amount and/or Complexity of Data Reviewed Independent Historian: EMS    Details: hx External Data Reviewed: notes. Labs: ordered. Decision-making details documented in ED Course. Radiology: ordered and independent interpretation performed. Decision-making details documented in ED Course. Discussion of management or test interpretation with external provider(s): medicine  Risk Prescription drug management. Parenteral controlled substances. Decision regarding hospitalization.   Iv ns. Continuous pulse ox and cardiac monitoring. Labs ordered/sent. Imaging ordered.   Differential diagnosis includes fx, contusion, dehydration, rhabdo, etc. Dispo decision including potential need for admission considered - will get labs and imaging and reassess.   Reviewed nursing notes and prior charts for additional history. External reports reviewed. Additional history from: EMS.  LR bolus. Morphine  iv. Zofran  iv. Bair economist.   Cardiac monitor: sinus rhythm, rate 88.  Labs reviewed/interpreted by me - wbc 10, hgb 12. Glucose sl low. Po fluids/food. Ua pending. Ck elevated 2200, additional boluses ivf.   Xrays reviewed/interpreted by me - no fx.   2315 signed out to Dr Theadore to check UA, patient receiving a couple liters ivf currently. Plan to admit to hospitalists re rhabdo, dehydration, ftt/gen weakness.       Final diagnoses:  Fall from bed, initial encounter  Elevated CK  Traumatic rhabdomyolysis, initial encounter   Low body temperature  Chronic bilateral low back pain without sciatica    ED Discharge Orders     None           Bernard Drivers, MD 11/04/24 2340  "

## 2024-11-05 ENCOUNTER — Inpatient Hospital Stay (HOSPITAL_COMMUNITY)

## 2024-11-05 ENCOUNTER — Observation Stay (HOSPITAL_COMMUNITY)

## 2024-11-05 DIAGNOSIS — E66813 Obesity, class 3: Secondary | ICD-10-CM

## 2024-11-05 DIAGNOSIS — M797 Fibromyalgia: Secondary | ICD-10-CM

## 2024-11-05 DIAGNOSIS — E860 Dehydration: Secondary | ICD-10-CM

## 2024-11-05 DIAGNOSIS — D649 Anemia, unspecified: Secondary | ICD-10-CM | POA: Diagnosis not present

## 2024-11-05 DIAGNOSIS — E162 Hypoglycemia, unspecified: Secondary | ICD-10-CM | POA: Diagnosis present

## 2024-11-05 DIAGNOSIS — R296 Repeated falls: Secondary | ICD-10-CM

## 2024-11-05 DIAGNOSIS — I10 Essential (primary) hypertension: Secondary | ICD-10-CM

## 2024-11-05 DIAGNOSIS — R6889 Other general symptoms and signs: Secondary | ICD-10-CM | POA: Diagnosis not present

## 2024-11-05 DIAGNOSIS — W06XXXA Fall from bed, initial encounter: Secondary | ICD-10-CM

## 2024-11-05 DIAGNOSIS — R748 Abnormal levels of other serum enzymes: Secondary | ICD-10-CM | POA: Diagnosis not present

## 2024-11-05 DIAGNOSIS — T796XXA Traumatic ischemia of muscle, initial encounter: Secondary | ICD-10-CM | POA: Diagnosis not present

## 2024-11-05 DIAGNOSIS — Z79899 Other long term (current) drug therapy: Secondary | ICD-10-CM

## 2024-11-05 DIAGNOSIS — M7989 Other specified soft tissue disorders: Secondary | ICD-10-CM | POA: Diagnosis not present

## 2024-11-05 DIAGNOSIS — M6282 Rhabdomyolysis: Secondary | ICD-10-CM | POA: Diagnosis present

## 2024-11-05 DIAGNOSIS — E0590 Thyrotoxicosis, unspecified without thyrotoxic crisis or storm: Secondary | ICD-10-CM

## 2024-11-05 DIAGNOSIS — G8929 Other chronic pain: Secondary | ICD-10-CM

## 2024-11-05 DIAGNOSIS — M545 Low back pain, unspecified: Secondary | ICD-10-CM | POA: Diagnosis not present

## 2024-11-05 DIAGNOSIS — T68XXXA Hypothermia, initial encounter: Secondary | ICD-10-CM | POA: Diagnosis present

## 2024-11-05 DIAGNOSIS — T8189XA Other complications of procedures, not elsewhere classified, initial encounter: Secondary | ICD-10-CM

## 2024-11-05 LAB — RETICULOCYTES
Immature Retic Fract: 15.7 % (ref 2.3–15.9)
RBC.: 4.08 MIL/uL (ref 3.87–5.11)
Retic Count, Absolute: 55.9 10*3/uL (ref 19.0–186.0)
Retic Ct Pct: 1.4 % (ref 0.4–3.1)

## 2024-11-05 LAB — COMPREHENSIVE METABOLIC PANEL WITH GFR
ALT: 37 U/L (ref 0–44)
AST: 86 U/L — ABNORMAL HIGH (ref 15–41)
Albumin: 3.1 g/dL — ABNORMAL LOW (ref 3.5–5.0)
Alkaline Phosphatase: 133 U/L — ABNORMAL HIGH (ref 38–126)
Anion gap: 10 (ref 5–15)
BUN: 8 mg/dL (ref 8–23)
CO2: 26 mmol/L (ref 22–32)
Calcium: 8.5 mg/dL — ABNORMAL LOW (ref 8.9–10.3)
Chloride: 107 mmol/L (ref 98–111)
Creatinine, Ser: 0.5 mg/dL (ref 0.44–1.00)
GFR, Estimated: >60 mL/min
Glucose, Bld: 132 mg/dL — ABNORMAL HIGH (ref 70–99)
Potassium: 3.8 mmol/L (ref 3.5–5.1)
Sodium: 143 mmol/L (ref 135–145)
Total Bilirubin: <0.2 mg/dL (ref 0.0–1.2)
Total Protein: 5.1 g/dL — ABNORMAL LOW (ref 6.5–8.1)

## 2024-11-05 LAB — CBG MONITORING, ED
Glucose-Capillary: 100 mg/dL — ABNORMAL HIGH (ref 70–99)
Glucose-Capillary: 101 mg/dL — ABNORMAL HIGH (ref 70–99)
Glucose-Capillary: 102 mg/dL — ABNORMAL HIGH (ref 70–99)
Glucose-Capillary: 111 mg/dL — ABNORMAL HIGH (ref 70–99)
Glucose-Capillary: 113 mg/dL — ABNORMAL HIGH (ref 70–99)
Glucose-Capillary: 128 mg/dL — ABNORMAL HIGH (ref 70–99)
Glucose-Capillary: 73 mg/dL (ref 70–99)
Glucose-Capillary: 74 mg/dL (ref 70–99)
Glucose-Capillary: 88 mg/dL (ref 70–99)
Glucose-Capillary: 94 mg/dL (ref 70–99)

## 2024-11-05 LAB — URINE DRUG SCREEN
Amphetamines: NEGATIVE
Barbiturates: NEGATIVE
Benzodiazepines: POSITIVE — AB
Cocaine: NEGATIVE
Fentanyl: NEGATIVE
Methadone Scn, Ur: NEGATIVE
Opiates: NEGATIVE
Tetrahydrocannabinol: NEGATIVE

## 2024-11-05 LAB — CBC
HCT: 32.8 % — ABNORMAL LOW (ref 36.0–46.0)
Hemoglobin: 9.9 g/dL — ABNORMAL LOW (ref 12.0–15.0)
MCH: 24.3 pg — ABNORMAL LOW (ref 26.0–34.0)
MCHC: 30.2 g/dL (ref 30.0–36.0)
MCV: 80.4 fL (ref 80.0–100.0)
Platelets: 279 10*3/uL (ref 150–400)
RBC: 4.08 MIL/uL (ref 3.87–5.11)
RDW: 16.2 % — ABNORMAL HIGH (ref 11.5–15.5)
WBC: 7.6 10*3/uL (ref 4.0–10.5)
nRBC: 0 % (ref 0.0–0.2)

## 2024-11-05 LAB — IRON AND TIBC
Iron: 44 ug/dL (ref 28–170)
Saturation Ratios: 16 % (ref 10.4–31.8)
TIBC: 273 ug/dL (ref 250–450)
UIBC: 229 ug/dL

## 2024-11-05 LAB — HIV ANTIBODY (ROUTINE TESTING W REFLEX): HIV Screen 4th Generation wRfx: NONREACTIVE

## 2024-11-05 LAB — CK: Total CK: 1645 U/L — ABNORMAL HIGH (ref 38–234)

## 2024-11-05 LAB — FERRITIN: Ferritin: 35 ng/mL (ref 11–307)

## 2024-11-05 LAB — LACTIC ACID, PLASMA: Lactic Acid, Venous: 0.9 mmol/L (ref 0.5–1.9)

## 2024-11-05 LAB — GLUCOSE, CAPILLARY
Glucose-Capillary: 97 mg/dL (ref 70–99)
Glucose-Capillary: 103 mg/dL — ABNORMAL HIGH (ref 70–99)

## 2024-11-05 LAB — MAGNESIUM: Magnesium: 1.9 mg/dL (ref 1.7–2.4)

## 2024-11-05 LAB — PRO BRAIN NATRIURETIC PEPTIDE: Pro Brain Natriuretic Peptide: 563 pg/mL — ABNORMAL HIGH

## 2024-11-05 LAB — D-DIMER, QUANTITATIVE: D-Dimer, Quant: 0.52 ug{FEU}/mL — ABNORMAL HIGH (ref 0.00–0.50)

## 2024-11-05 LAB — FOLATE: Folate: >20 ng/mL

## 2024-11-05 LAB — ETHANOL: Alcohol, Ethyl (B): <15 mg/dL

## 2024-11-05 LAB — PHOSPHORUS
Phosphorus: 3.2 mg/dL (ref 2.5–4.6)
Phosphorus: 3.2 mg/dL (ref 2.5–4.6)

## 2024-11-05 LAB — VITAMIN B12: Vitamin B-12: >4000 pg/mL — ABNORMAL HIGH (ref 180–914)

## 2024-11-05 LAB — PREALBUMIN: Prealbumin: 14 mg/dL — ABNORMAL LOW (ref 18–38)

## 2024-11-05 MED ORDER — FENTANYL CITRATE (PF) 50 MCG/ML IJ SOSY: 12.5000 ug | PREFILLED_SYRINGE | INTRAMUSCULAR | Status: DC | PRN

## 2024-11-05 MED ORDER — DIPHENHYDRAMINE HCL 50 MG/ML IJ SOLN
50.0000 mg | Freq: Once | INTRAMUSCULAR | Status: AC
  Administered 2024-11-05: 50 mg via INTRAVENOUS
  Filled 2024-11-05: qty 1

## 2024-11-05 MED ORDER — ONDANSETRON HCL 4 MG/2ML IJ SOLN: 4.0000 mg | Freq: Four times a day (QID) | INTRAMUSCULAR | Status: DC | PRN

## 2024-11-05 MED ORDER — HYDROCODONE-ACETAMINOPHEN 5-325 MG PO TABS
1.0000 | ORAL_TABLET | Freq: Four times a day (QID) | ORAL | Status: DC | PRN
  Administered 2024-11-05 – 2024-11-07 (×3): 1 via ORAL
  Filled 2024-11-05 (×3): qty 1

## 2024-11-05 MED ORDER — ACETAMINOPHEN 650 MG RE SUPP: 650.0000 mg | Freq: Four times a day (QID) | RECTAL | Status: DC | PRN

## 2024-11-05 MED ORDER — ENOXAPARIN SODIUM 40 MG/0.4ML IJ SOSY
40.0000 mg | PREFILLED_SYRINGE | INTRAMUSCULAR | Status: DC
  Filled 2024-11-05: qty 0.4

## 2024-11-05 MED ORDER — DIAZEPAM 2 MG PO TABS
2.0000 mg | ORAL_TABLET | Freq: Two times a day (BID) | ORAL | Status: DC | PRN
  Administered 2024-11-08: 2 mg via ORAL
  Filled 2024-11-05: qty 1

## 2024-11-05 MED ORDER — DOXYCYCLINE HYCLATE 100 MG PO TABS
100.0000 mg | ORAL_TABLET | Freq: Two times a day (BID) | ORAL | Status: DC
  Administered 2024-11-05 – 2024-11-10 (×11): 100 mg via ORAL
  Filled 2024-11-05 (×11): qty 1

## 2024-11-05 MED ORDER — METHOCARBAMOL 500 MG PO TABS
500.0000 mg | ORAL_TABLET | Freq: Four times a day (QID) | ORAL | Status: DC
  Administered 2024-11-05 – 2024-11-07 (×11): 500 mg via ORAL
  Filled 2024-11-05 (×12): qty 1

## 2024-11-05 MED ORDER — THIAMINE HCL 100 MG/ML IJ SOLN
500.0000 mg | Freq: Every day | INTRAVENOUS | Status: DC
  Administered 2024-11-05 – 2024-11-08 (×4): 500 mg via INTRAVENOUS
  Filled 2024-11-05 (×5): qty 5

## 2024-11-05 MED ORDER — LUBIPROSTONE 24 MCG PO CAPS
48.0000 ug | ORAL_CAPSULE | Freq: Every day | ORAL | Status: DC
  Administered 2024-11-06 – 2024-11-09 (×3): 48 ug via ORAL
  Filled 2024-11-05 (×6): qty 2

## 2024-11-05 MED ORDER — PANTOPRAZOLE SODIUM 40 MG PO TBEC
80.0000 mg | DELAYED_RELEASE_TABLET | Freq: Every day | ORAL | Status: DC
  Administered 2024-11-05 – 2024-11-10 (×6): 80 mg via ORAL
  Filled 2024-11-05 (×6): qty 2

## 2024-11-05 MED ORDER — IOHEXOL 350 MG/ML SOLN
75.0000 mL | Freq: Once | INTRAVENOUS | Status: AC | PRN
  Administered 2024-11-05: 75 mL via INTRAVENOUS

## 2024-11-05 MED ORDER — METOPROLOL TARTRATE 5 MG/5ML IV SOLN: 2.5000 mg | INTRAVENOUS | Status: DC | PRN

## 2024-11-05 MED ORDER — ACETAMINOPHEN 325 MG PO TABS
650.0000 mg | ORAL_TABLET | Freq: Four times a day (QID) | ORAL | Status: DC | PRN
  Administered 2024-11-05 – 2024-11-06 (×3): 650 mg via ORAL
  Filled 2024-11-05 (×4): qty 2

## 2024-11-05 MED ORDER — DEXTROSE-SODIUM CHLORIDE 5-0.45 % IV SOLN: INTRAVENOUS | Status: AC

## 2024-11-05 MED ORDER — ALBUTEROL SULFATE (2.5 MG/3ML) 0.083% IN NEBU
2.5000 mg | INHALATION_SOLUTION | Freq: Four times a day (QID) | RESPIRATORY_TRACT | Status: DC | PRN
  Administered 2024-11-06: 2.5 mg via RESPIRATORY_TRACT
  Filled 2024-11-05: qty 3

## 2024-11-05 MED ORDER — HYDROCODONE-ACETAMINOPHEN 5-325 MG PO TABS: 1.0000 | ORAL_TABLET | ORAL | Status: DC | PRN

## 2024-11-05 MED ORDER — DIPHENHYDRAMINE HCL 25 MG PO CAPS: 50.0000 mg | ORAL_CAPSULE | Freq: Once | ORAL | Status: AC

## 2024-11-05 MED ORDER — ATENOLOL 25 MG PO TABS
50.0000 mg | ORAL_TABLET | Freq: Every day | ORAL | Status: DC
  Administered 2024-11-05 – 2024-11-10 (×6): 50 mg via ORAL
  Filled 2024-11-05 (×6): qty 2

## 2024-11-05 MED ORDER — HEPARIN SODIUM (PORCINE) 5000 UNIT/ML IJ SOLN
5000.0000 [IU] | Freq: Three times a day (TID) | INTRAMUSCULAR | Status: DC
  Administered 2024-11-05 – 2024-11-10 (×14): 5000 [IU] via SUBCUTANEOUS
  Filled 2024-11-05 (×14): qty 1

## 2024-11-05 MED ORDER — SODIUM CHLORIDE 0.9 % IV BOLUS: 1000.0000 mL | Freq: Once | INTRAVENOUS | Status: DC

## 2024-11-05 MED ORDER — ONDANSETRON HCL 4 MG PO TABS: 4.0000 mg | ORAL_TABLET | Freq: Four times a day (QID) | ORAL | Status: DC | PRN

## 2024-11-05 MED ORDER — ASPIRIN 81 MG PO TBEC
81.0000 mg | DELAYED_RELEASE_TABLET | Freq: Every morning | ORAL | Status: DC
  Administered 2024-11-05 – 2024-11-10 (×6): 81 mg via ORAL
  Filled 2024-11-05 (×6): qty 1

## 2024-11-05 MED ORDER — METHYLPREDNISOLONE SODIUM SUCC 40 MG IJ SOLR
40.0000 mg | Freq: Once | INTRAMUSCULAR | Status: AC
  Administered 2024-11-05: 40 mg via INTRAVENOUS
  Filled 2024-11-05: qty 1

## 2024-11-05 MED ORDER — SODIUM CHLORIDE 0.9 % IV BOLUS
500.0000 mL | Freq: Once | INTRAVENOUS | Status: AC
  Administered 2024-11-05: 500 mL via INTRAVENOUS

## 2024-11-05 NOTE — Assessment & Plan Note (Signed)
 Would benefit from PT/ot eval

## 2024-11-05 NOTE — Consult Note (Addendum)
" °  CLINICAL SUPPORT TEAM - WOUND OSTOMY AND CONTINENCE TEAM  CONSULTATION SERVICES   WOC Nurse-Inpatient Note  WOC Nurse Consult Note: patient with surgery by Dr. Gillie 06/2024  Reason for Consult: back wound  Wound type: full thickness back wound post surgery as above  Pressure Injury POA: NA not pressure  Measurement: see nursing flowsheet  Wound azi:tzoo healing surgical incision with open area noted superior aspect of incision  Drainage (amount, consistency, odor)  minimal tan  Periwound: surgical scar  Dressing procedure/placement/frequency: Cleanse back wound with NS, using a Q tip applicator apply a small piece of silver hydrofiber ([id number]) to open wound bed daily and secure with silicone foam or ABD pad and clothe tape whichever is preferred.   POC discussed with bedside nurse. WOC team will not follow. Reconsult if further needs arise.   Thank you,    Tamaiya Bump MSN, RN-BC, CWOCN    "

## 2024-11-05 NOTE — ED Notes (Addendum)
 Nurse called floor while at CT and stated nurse would bring pt up after scan since she had a ready bed. Floor stated nurse was off the floor and would need to call back. RN charge made aware.

## 2024-11-05 NOTE — TOC Initial Note (Addendum)
 Transition of Care [facility]) - Initial/Assessment Note    Patient Details  Name: [name] MRN: [id number] Date of Birth: 04-Dec-1960  Transition of Care [facility] CM/SW Contact:    Luise JAYSON Pan, LCSWA Phone Number: 11/05/2024, 7:01 PM  Clinical Narrative:  Patient from home alone. CSW followed up with patient about PT rec for SNF. Patient asked for time to think about it but is open to CSW sending out referrals for short term rehab. CSW to follow up with bed offers.   Per chart review, patient has prior SNF hx at [facility] 09/05/2024 - 09/23/2024; CSW to assess if patient is in copay days when facility admissions director is available.   CSW will continue to follow.        Expected Discharge Plan: Skilled Nursing Facility Barriers to Discharge: Continued Medical Work up, SNF Pending bed offer, Insurance Authorization   Patient Goals and CMS Choice Patient states their goals for this hospitalization and ongoing recovery are:: Unsure at this time CMS Medicare.gov Compare Post Acute Care list provided to:: Patient Choice offered to / list presented to : Patient [place] ownership interest in [facility].provided to:: Patient    Expected Discharge Plan and Services In-house Referral: Clinical Social Work   Post Acute Care Choice: Skilled Nursing Facility Living arrangements for the past 2 months: Single Family Home                                      Prior Living Arrangements/Services Living arrangements for the past 2 months: Single Family Home Lives with:: Self Patient language and need for interpreter reviewed:: Yes Do you feel safe going back to the place where you live?: Yes      Need for Family Participation in Patient Care: No (Comment) Care giver support system in place?: No (comment) Current home services: DME (wheelchair/ rollator) Criminal Activity/Legal Involvement Pertinent to Current Situation/Hospitalization: No - Comment as  needed  Activities of Daily Living   ADL Screening (condition at time of admission) Independently performs ADLs?: Yes (appropriate for developmental age) Is the patient deaf or have difficulty hearing?: No Does the patient have difficulty seeing, even when wearing glasses/contacts?: No Does the patient have difficulty concentrating, remembering, or making decisions?: No  Permission Sought/Granted Permission sought to share information with : Facility Industrial/product Designer granted to share information with : Yes, Verbal Permission Granted     Permission granted to share info w AGENCY: SNFs        Emotional Assessment Appearance:: Appears stated age Attitude/Demeanor/Rapport: Engaged, Lethargic Affect (typically observed): Quiet Orientation: : Oriented to Self, Oriented to Place, Oriented to  Time, Oriented to Situation Alcohol  / Substance Use: Not Applicable Psych Involvement: No (comment)  Admission diagnosis:  Dehydration [E86.0] Hypoglycemia [E16.2] Elevated CK [R74.8] Fall from bed, initial encounter [W06.XXXA] Traumatic rhabdomyolysis, initial encounter [T79.6XXA] Chronic bilateral low back pain without sciatica [M54.50, G89.29] Low body temperature [R68.89] Patient Active Problem List   Diagnosis Date Noted   Hypoglycemia 11/05/2024   Hypothermia 11/05/2024   Rhabdomyolysis 11/05/2024   Weakness of both lower extremities 04/08/2024   Neurogenic bowel 01/20/2024   Neurogenic bladder 01/20/2024   Thoracic discitis 01/19/2024   Thoracic myelopathy 01/19/2024   Thyroid  dysfunction 01/14/2024   Irritable bowel syndrome 01/14/2024   Thoracic stenosis 01/08/2024   Thoracic spinal stenosis 01/08/2024   Obesity, Class III, BMI 40-49.9 (morbid obesity) (HCC) 12/30/2023  History of long term use of antibiotics due to prior spinal hardware infection MSSA and Enterobacter cloacae 12/30/2023   Osteomyelitis of thoracic region [facility]) 12/16/2023   Medication  management 12/15/2023   Falls frequently 11/27/2023   Problem with vascular access--PICC line dislodgement 11/26/2023   T9 discitis/osteomyelitis and T9 epidural phlegmon/abscess s/p aspiration 10/29/2023 10/28/2023   Wound infection after surgery 05/14/2022   Delayed surgical wound healing 05/12/2022   Postoperative wound infection 04/08/2022   Scoliosis due to degenerative disease of spine in adult patient 03/30/2022   HNP (herniated nucleus pulposus), cervical 12/08/2020   Lumbago with sciatica, unspecified side 07/28/2020   PICC (peripherally inserted central catheter) in place 03/16/2020   Encounter for long-term (current) use of antibiotics 03/16/2020   Polypharmacy 03/09/2020   At risk for adverse drug event 03/09/2020   MSSA bacteremia 02/29/2020   Postoperative infection 02/28/2020   Abnormal gait due to muscle weakness 02/26/2020   HNP (herniated nucleus pulposus), lumbar 02/16/2020   Muscle weakness of lower extremity 02/14/2020   Spondylolisthesis of lumbar region 10/30/2017   Painful orthopaedic hardware right ankle 05/27/2016   Closed fracture of distal end of right fibula and tibia    Ankle syndesmosis disruption    ASCUS favor benign 01/06/2014   Leg swelling 12/22/2013   Seasonal allergies    Anemia    Chronic back pain due to DJD with history surgery    Hyperthyroidism    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 [name] [address] Phone: [phone] Fax: [phone]  [id number] [place], [name] - [address] AT [facility]  [address] [name] [address] Phone: [phone] Fax: [phone]  [facility] [address] [name] [address] Phone: [phone] Fax: [phone]     Social Drivers of Health (SDOH) Social History: SDOH Screenings   Food Insecurity: No Food Insecurity (11/05/2024)  Housing: Low Risk (11/05/2024)  Transportation Needs: No Transportation Needs (11/05/2024)  Utilities: Not At Risk (11/05/2024)  Depression (PHQ2-9): Low Risk (01/12/2024)  Tobacco Use: Medium Risk (11/04/2024)   SDOH Interventions:     Readmission Risk Interventions    05/17/2022    3:02 PM  Readmission Risk Prevention Plan  Transportation Screening Complete  PCP or Specialist Appt within 5-7 Days Complete  Home Care Screening Complete  Medication Review (RN CM) Complete

## 2024-11-05 NOTE — Assessment & Plan Note (Signed)
 Check TSH

## 2024-11-05 NOTE — Assessment & Plan Note (Signed)
 Allow permissive htn when able would restart atenolol 

## 2024-11-05 NOTE — Progress Notes (Signed)
 Bilateral lower extremity venous duplex has been completed.  Results can be found in chart review under CV Proc.  11/05/2024 3:18 PM  Glenette Bookwalter Elden Appl, RVT.

## 2024-11-05 NOTE — H&P (Incomplete)
 " History and Physical    Patient: [name] FMW:[id number] DOB: 1961/06/01 DOA: 11/04/2024 DOS: the patient was seen and examined on 11/05/2024 PCP: Caleen Dirks, MD  Patient coming from: {Point_of_Origin:26777}  Chief Complaint:  Chief Complaint  Patient presents with   Fall   HPI: [name] is a 64 y.o. female with medical history significant of ***  Review of Systems: {ROS_Text:26778} Past Medical History:  Diagnosis Date   Anemia    Ankle syndesmosis disruption    Anxiety    takes Ativan  and Valium , after mother passed   Arthritis    bilateral knees s/p knee replacement bilaterally   Asthma    Bronchitis    Bruising    pt states unexplained d/t fibromyalgia   Chronic back pain    2012 tailbone surgery and 3 lower discs.    Closed fracture of distal end of right fibula and tibia    Depression    from Fibromyalgia diagnosis; not taking medicine. since 2001   Dizziness    rarely   Fibromyalgia    diagnosed 2001   GERD (gastroesophageal reflux disease)    Prilosec occasionally   Headache(784.0)    sinus headaches   History of hiatal hernia    Hypertension    since 2013   Hyperthyroidism    subclinical, no treatment; thyroid  nodules   IBS (irritable bowel syndrome)    Impaired memory    states from fibromyalgia   Insomnia    takes Ambien    Jones fracture    left foot fifth metatarsal   Multiple allergies    including latex, pet dander, shellfish, pet dander   Painful orthopaedic hardware right ankle 05/27/2016   Seasonal allergies    Shortness of breath    Occasional with exertion;    Sleep apnea    Sore gums    this is why pt is on Amoxil-only takes for dental work   Tachycardia    Thyroid  goiter    Varicose vein    protrudes above skin-per pt;vein popped and bruised;ultrasound done to make sure that there were no clots;noclots were found   Past Surgical History:  Procedure Laterality Date   ANTERIOR  CERVICAL DECOMP/DISCECTOMY FUSION N/A 12/08/2020   Procedure: Cervical Four-Five Anterior cervical decompression/discectomy/fusion;  Surgeon: Gillie Duncans, MD;  Location: [facility];  Service: Neurosurgery;  Laterality: N/A;  anterior   ANTERIOR LUMBAR FUSION  09/20/2011   Procedure: ANTERIOR LUMBAR FUSION 1 LEVEL;  Surgeon: Duncans LITTIE Gillie;  Location: [facility];  Service: Neurosurgery;  Laterality: N/A;  Lumbar five-Sacral One Anterior Lumbar Interbody Fusion /Dr. Early to Approach    APPLICATION OF WOUND VAC N/A 05/14/2022   Procedure: Removal of wound vac with closure of wound;  Surgeon: Gillie Duncans, MD;  Location: [facility] OR;  Service: Neurosurgery;  Laterality: N/A;  Pt to be admitted on 05-12-2022   CERVICAL DISC SURGERY     WITH TITANIUM PLATE IN NECK---LEFT SIDE   CERVICAL SPINE SURGERY  03/24/2019   CESAREAN SECTION     EYE SURGERY Bilateral 2023   cataract   fibroidectomy     FRACTURE SURGERY  05/08/2010   Jones fracture left foot fifth metatarsal   HARDWARE REMOVAL Right 05/28/2016   Procedure: RIGHT ANKLE HARDWARE REMOVAL;  Surgeon: Lamar Millman, MD;  Location: [facility];  Service: Orthopedics;  Laterality: Right;   HARDWARE REMOVAL N/A 07/28/2020   Procedure: Removal of Lumbar Hardware;  Surgeon: Gillie Duncans, MD;  Location: [facility] OR;  Service: Neurosurgery;  Laterality: N/A;   HERNIA REPAIR     hiatal hernia   IR THORACIC DISC ASPIRATION W/IMG GUIDE  10/29/2023   IRRIGATION AND DEBRIDEMENT KNEE  04/09/2012   Procedure: IRRIGATION AND DEBRIDEMENT KNEE;  Surgeon: LELON JONETTA Shari Mickey., MD;  Location: [facility] OR;  Service: Orthopedics;  Laterality: Right;   JOINT REPLACEMENT  01/27/2012   left total knee and Right total knee   KNEE SURGERY  2005   Left knee arthroscopy   LAMINECTOMY WITH POSTERIOR LATERAL ARTHRODESIS LEVEL 4 Bilateral 03/22/2022   Procedure: Thoracic ten to Lumbar three Posterior lateral arthrodesis with screws;  Surgeon: Gillie Duncans, MD;   Location: [facility] OR;  Service: Neurosurgery;  Laterality: Bilateral;   LUMBAR WOUND DEBRIDEMENT N/A 04/08/2022   Procedure: LUMBAR WOUND DEBRIDEMENT;  Surgeon: Gillie Duncans, MD;  Location: [facility] OR;  Service: Neurosurgery;  Laterality: N/A;   NASAL SEPTOPLASTY W/ TURBINOPLASTY  2007   due to recurrent sinusitis   ORIF ANKLE FRACTURE Right 06/21/2015   Procedure: OPEN REDUCTION INTERNAL FIXATION RIGHT DISTAL FIBULA  FRACTURE AND OPEN REDUCTION INTERNAL FIXATION SYNDESMOSIS ;  Surgeon: Lamar Millman, MD;  Location: [facility];  Service: Orthopedics;  Laterality: Right;   PARTIAL HYSTERECTOMY     SHOULDER ARTHROSCOPY W/ ROTATOR CUFF REPAIR Right    SHOULDER SURGERY Left    SPINE SURGERY  2004   Cervical plate, ACDF   STERIOD INJECTION  01/27/2012   Procedure: STEROID INJECTION;  Surgeon: Lamar DELENA Millman, MD;  Location: [facility];  Service: Orthopedics;  Laterality: Right;   TEE WITHOUT CARDIOVERSION N/A 03/01/2020   Procedure: TRANSESOPHAGEAL ECHOCARDIOGRAM (TEE);  Surgeon: Jeffrie Oneil BROCKS, MD;  Location: [facility];  Service: Cardiovascular;  Laterality: N/A;   THORACIC DISCECTOMY  02/16/2020   Procedure: Thoracic ten-eleven Discectomy;  Surgeon: Gillie Duncans, MD;  Location: [facility] OR;  Service: Neurosurgery;;   THORACIC DISCECTOMY N/A 01/08/2024   Procedure: THORACIC NINE-TEN LAMINECTOMY AND DECOMPRESSION;  Surgeon: Gillie Duncans, MD;  Location: [facility];  Service: Neurosurgery;  Laterality: N/A;   THORACIC DISCECTOMY N/A 06/29/2024   Procedure: THORACIC NINE THORACIC TEN LAMINECTOMY with  prevena plus 125 therapy;  Surgeon: Gillie Duncans, MD;  Location: [facility] OR;  Service: Neurosurgery;  Laterality: N/A;   TONSILLECTOMY  2007   TOTAL KNEE ARTHROPLASTY  01/27/2012   Procedure: TOTAL KNEE ARTHROPLASTY;  Surgeon: Lamar DELENA Millman, MD;  Bilateral   TOTAL KNEE ARTHROPLASTY  04/06/2012   Procedure: TOTAL KNEE ARTHROPLASTY;  Surgeon: Lamar DELENA Millman, MD;  Location: [facility] OR;  Service:  Orthopedics;  Laterality: Right;   TUBAL LIGATION     UTERINE FIBROID SURGERY     mid 200s   WOUND EXPLORATION N/A 02/27/2020   Procedure: WOUND EXPLORATION;  Surgeon: Gillie Duncans, MD;  Location: [facility] OR;  Service: Neurosurgery;  Laterality: N/A;,  (wound vac upper back)   WOUND EXPLORATION N/A 03/30/2022   Procedure: THORACIC WOUND EXPLORATION WITH REPAIR OF CSF LEAK;  Surgeon: Gillie Duncans, MD;  Location: [facility] OR;  Service: Neurosurgery;  Laterality: N/A;   Social History:  reports that she quit smoking about 25 years ago. Her smoking use included cigarettes. She started smoking about 35 years ago. She has a 5 pack-year smoking history. She has never used smokeless tobacco. She reports that she does not drink alcohol  and does not use drugs.  Allergies[1]  Family History  Problem Relation Age of Onset   Stroke Father        passed 2004 from pneumonia  Alcohol  abuse Father    Pulmonary embolism Mother        after minor knee surgery leading to DVT   Arthritis Sister    Depression Sister    Hypertension Sister    Diabetes Other        grandmother   Anesthesia problems Neg Hx    Hypotension Neg Hx    Malignant hyperthermia Neg Hx    Pseudochol deficiency Neg Hx     Prior to Admission medications  Medication Sig Start Date End Date Taking? Authorizing Provider  acetaminophen  (TYLENOL ) 500 MG tablet Take 500-1,000 mg by mouth every 6 (six) hours as needed (pain.).   Yes [provider]  albuterol  (VENTOLIN  HFA) 108 (90 Base) MCG/ACT inhaler Inhale 2 puffs into the lungs every 6 (six) hours as needed for wheezing or shortness of breath. 03/24/20  Yes Medina-Vargas, Monina C, NP  ASCORBIC ACID  PO Take 2 tablets by mouth in the morning. Gummies   Yes [provider]  aspirin  EC 81 MG tablet Take 81 mg by mouth in the morning.   Yes [provider]  atenolol  (TENORMIN ) 50 MG tablet Take 1 tablet (50 mg total) by mouth daily. 03/24/20  Yes  Medina-Vargas, Monina C, NP  CELEBREX  200 MG capsule Take 200 mg by mouth 2 (two) times daily.   Yes [provider]  chlorzoxazone  (PARAFON ) 500 MG tablet Take 500 mg by mouth in the morning, at noon, in the evening, and at bedtime.   Yes [provider]  Cholecalciferol  (VITAMIN D -3 PO) Take 1 tablet by mouth in the morning.   Yes [provider]  Cyanocobalamin  (VITAMIN B-12 PO) Take 1 tablet by mouth in the morning.   Yes [provider]  cyclobenzaprine  (FLEXERIL ) 10 MG tablet Take 1 tablet (10 mg total) by mouth 3 (three) times daily. 09/04/24  Yes Meyran, Suzen Lacks, NP  diazepam  (VALIUM ) 5 MG tablet Take 5 mg by mouth in the morning and at bedtime.   Yes [provider]  diphenhydrAMINE  (BENADRYL ) 25 MG tablet Take 50 mg by mouth in the morning, at noon, in the evening, and at bedtime.   Yes [provider]  doxycycline  (VIBRA -TABS) 100 MG tablet Take 100 mg by mouth 2 (two) times daily.   Yes [provider]  gabapentin  (NEURONTIN ) 600 MG tablet Take 2 tablets (1,200 mg total) by mouth 3 (three) times daily. 03/24/20  Yes Medina-Vargas, Monina C, NP  hydrocerin (EUCERIN) CREA Apply 1 Application topically 2 (two) times daily. Patient taking differently: Apply 1 Application topically 2 (two) times daily as needed (skin irritation). 01/30/24  Yes Love, Sharlet RAMAN, PA-C  hydrochlorothiazide  (HYDRODIURIL ) 25 MG tablet Take 25 mg by mouth in the morning. 02/02/24  Yes [provider]  lubiprostone  (AMITIZA ) 24 MCG capsule Take 1 capsule (24 mcg total) by mouth 2 (two) times daily with a meal. Patient taking differently: Take 48 mcg by mouth daily with lunch. 03/24/20  Yes Medina-Vargas, Monina C, NP  methocarbamol  (ROBAXIN ) 500 MG tablet Take 500 mg by mouth 4 (four) times daily.   Yes [provider]  Multiple Minerals-Vitamins (CALCIUM -MAGNESIUM -ZINC -D3 PO) Take 1 tablet by mouth in the morning.   Yes [provider]  Multiple Vitamin (MULTIVITAMIN WITH MINERALS) TABS tablet Take 1 tablet by mouth in the morning. Women's Multivitamin   Yes [provider]  naloxone  (NARCAN ) nasal spray 4 mg/0.1 mL Place 1 spray into the nose once as needed (opioid overdose). 02/02/24  Yes Love, Sharlet RAMAN, PA-C  omeprazole (PRILOSEC) 20 MG capsule Take 20 mg by mouth in the morning and at bedtime.    Yes [provider]  oxyCODONE -acetaminophen  (PERCOCET) 10-325 MG tablet Take 1 tablet by mouth 5 (five) times daily. 09/04/24  Yes Meyran, Suzen Lacks, NP  polyethylene glycol powder (GLYCOLAX /MIRALAX ) 17 GM/SCOOP powder Take 17 g by mouth daily. 01/31/24  Yes Love, Sharlet RAMAN, PA-C  VOLTAREN  1 % GEL Apply 1 Application topically 4 (four) times daily as needed (pain.).   Yes [provider]    Physical Exam: Vitals:   11/04/24 2049 11/04/24 2100 11/04/24 2141 11/04/24 2245  BP: 137/79 (!) 149/85 (!) 140/75   Pulse: 87 87 90   Resp: 15 13 16    Temp:    (!) 95.3 F (35.2 C)  TempSrc:    Rectal  SpO2: 97% 98% 98%   Weight:      Height:       *** Data Reviewed: {Tip this will not be part of the note when signed- Document your independent interpretation of telemetry tracing, EKG, lab, Radiology test or any other diagnostic tests. Add any new diagnostic test ordered today. (Optional):26781} {Results:26384}  Assessment and Plan: No notes have been filed under this hospital service. Service: Hospitalist     Advance Care Planning:   Code Status: Prior ***  Consults: ***  Family Communication: ***  Severity of Illness: {Observation/Inpatient:21159}  Author: Kelden Lavallee, MD 11/05/2024 12:02 AM  For on call review [email or web address].       [1] Allergies Allergen Reactions   Monosodium Glutamate Anaphylaxis and Swelling    Eyes swollen shut, facial swelling, tongue swelling.   Shellfish Allergy Anaphylaxis   Melatonin Other (See Comments)    Muscle spasms  involuntary movement   Baclofen Nausea Only    Dizziness and increase muscle spasm   Celebrex  [Celecoxib ] Itching    Only allergic to generic brand   Ciprofloxacin  Other (See Comments)    inability to walk/Unable to walk   Contrast Media [Iodinated Contrast Media] Itching and Nausea Only    could not walk   Diclofenac  Itching    Generic Diclefenac gel causes itching. Can take the name brand Voltaren  gel   Gadolinium     Allergic to MRI contrast dye per patient.   Molds & Smuts Other (See Comments)    Causes allergies to flair up/sinus issues/headaches     Other Other (See Comments)    Pet dander,        Promethazine  Itching   Statins Other (See Comments)    pain   Sulfa  Antibiotics Other (See Comments)    Rash, migraine    Latex Itching and Rash    Latex glove with powder; bite blocks used for EGD studies  "

## 2024-11-05 NOTE — ED Notes (Signed)
 MD at bedside.

## 2024-11-05 NOTE — ED Notes (Signed)
 Patient provided with water, turkey sandwich, and applesauce at this time.

## 2024-11-05 NOTE — ED Notes (Signed)
 Pt transported to CT by RN so that pt could be taken to the floor after scan.

## 2024-11-05 NOTE — Assessment & Plan Note (Signed)
 In the setting of poor po intake  Will continue to monitor serial cbg

## 2024-11-05 NOTE — Assessment & Plan Note (Signed)
 Contributing to comorbidity and complicating medical management  Body mass index is 38.97 kg/m.  Nutritional follow up as an out pt would be recommended

## 2024-11-05 NOTE — Assessment & Plan Note (Signed)
 Obtain anemia panel  Transfuse for Hg <7 , rapidly dropping or  if symptomatic

## 2024-11-05 NOTE — Assessment & Plan Note (Signed)
 Continue Press Photographer for any source of infection

## 2024-11-05 NOTE — Evaluation (Addendum)
 Physical Therapy Evaluation Patient Details Name: [name] MRN: [id number] DOB: January 08, 1961 Today's Date: 11/05/2024  History of Present Illness  64 y.o. female presenting 11/04/24 after unwitnessed fall, reportedly found down >24 hr later by son, denies hitting head; found to be hypothermic, hypoglycemic, has small back wound (pt reports h/o open area around thoracic incision that drains, neurosx following).  Of note, s/p T9-10 laminectomy 06/2024. Other PMH includes multiple back/cervical sxs, CSF leak, HTN, fibromyalgia, anxiety, IBS.   Clinical Impression  Pt presents with an overall decrease in functional mobility secondary to above. Pt difficult historian, reports household ambulator with rollator, lives alone; has a girl who helps with cooking/household tasks. Today, pt requiring max-totalA for bed mobility; will require assist +2 and lift equipment for OOB mobility. Pt limited by significant pain, generalized weakness, decreased activity tolerance and impaired cognition. Pt would benefit from post-acute rehab services (< 3 hrs/day) to maximize functional mobility and independence prior to d/c home.       If plan is discharge home, recommend the following: Two people to help with walking and/or transfers;Two people to help with bathing/dressing/bathroom;Assistance with cooking/housework;Direct supervision/assist for medications management;Direct supervision/assist for financial management;Assist for transportation;Help with stairs or ramp for entrance   Can travel by private vehicle   No    Equipment Recommendations  (TBD)  Recommendations for Other Services       Functional Status Assessment Patient has had a recent decline in their functional status and demonstrates the ability to make significant improvements in function in a reasonable and predictable amount of time.     Precautions / Restrictions Precautions Precautions: Fall;Other (comment) Recall of  Precautions/Restrictions: Impaired Precaution/Restrictions Comments: bladder/bowel incontinence; chronic wound at thoracic incision (sx 06/2024) Restrictions Weight Bearing Restrictions Per Provider Order: No      Mobility  Bed Mobility Overal bed mobility: Needs Assistance Bed Mobility: Rolling Rolling: Max assist         General bed mobility comments: maxA to roll to R-side 2x for pericare due to bowel incontinence; attempted supine>sit but pt with decreased movement initiation, increased pain and unable to perform with maxA+1. totalA to scoot up in bed    Transfers                        Ambulation/Gait                  Stairs            Wheelchair Mobility     Tilt Bed    Modified Rankin (Stroke Patients Only)       Balance                                             Pertinent Vitals/Pain Pain Assessment Pain Assessment: Faces Faces Pain Scale: Hurts even more Pain Location: back > BUEs/BLEs Pain Descriptors / Indicators: Discomfort, Grimacing, Guarding, Moaning Pain Intervention(s): Limited activity within patient's tolerance, Monitored during session, Repositioned    Home Living Family/patient expects to be discharged to:: Private residence Living Arrangements: Alone Available Help at Discharge: Family;Friend(s);Available PRN/intermittently Type of Home: House Home Access: Ramped entrance       Home Layout: One level Home Equipment: Rollator (4 wheels);Wheelchair - manual Additional Comments: pt not forthcoming with details, giving inconsistent info, unsure reliable historian    Prior Function Prior Level of  Function : Needs assist;Patient poor historian/Family not available             Mobility Comments: pt reports limited ambulator with rollator ADLs Comments: pt initially states indep ADL/iADLs, then states, No I can't do everything by myself... I have a lady that comes in and cleans. per chart,  prior to admission 06/2024, pt requiring assist for most ADLs (bed-level bathing, bed-level toileting using bedpan, dressing) since 02/2024     Extremity/Trunk Assessment   Upper Extremity Assessment Upper Extremity Assessment: RUE deficits/detail;LUE deficits/detail;Difficult to assess due to impaired cognition RUE Deficits / Details: notable bilateral hand/finger swelling; bilateral shoulder AROM <90' limited by pain; difficult to determine true strength/ROM due to decreased movement initiation/effort RUE: Unable to fully assess due to pain LUE Deficits / Details: notable bilateral hand/finger swelling; bilateral shoulder AROM <90' limited by pain; difficult to determine true strength/ROM due to decreased movement initiation/effort LUE: Unable to fully assess due to pain    Lower Extremity Assessment Lower Extremity Assessment: RLE deficits/detail;LLE deficits/detail;Difficult to assess due to impaired cognition RLE Deficits / Details: minimal active movement noted BLEs (gross observed strength </ 2/5), notable swelling and redness/skin changes around knees and lower legs. difficult to determine true strength/ROM due to decreased movement initiation/effort RLE: Unable to fully assess due to pain LLE Deficits / Details: minimal active movement noted BLEs (gross observed strength </ 2/5), notable swelling and redness/skin changes around knees and lower legs. difficult to determine true strength/ROM due to decreased movement initiation/effort LLE: Unable to fully assess due to pain    Cervical / Trunk Assessment Cervical / Trunk Assessment: Other exceptions Cervical / Trunk Exceptions: h/o multiple back surgeries (most recently thoracic 06/2024)  Communication   Communication Communication: No apparent difficulties    Cognition Arousal: Lethargic, Alert Behavior During Therapy: Flat affect   PT - Cognitive impairments: No family/caregiver present to determine baseline, Awareness,  Attention, Initiation, Sequencing, Problem solving, Safety/Judgement                       PT - Cognition Comments: difficult to determine true cognitive impairment vs fatigue vs decreased participation secondary to pain Following commands: Impaired Following commands impaired: Follows one step commands inconsistently, Follows one step commands with increased time     Cueing Cueing Techniques: Verbal cues, Gestural cues, Tactile cues     General Comments General comments (skin integrity, edema, etc.): SpO2 94% on RA. pt with bowel incontinence, dependent for bed-level pericare/hygiene. educ re: role of acute PT, POC, activity recommendations, importance of mobility, edema control, BUE/BLE AROM, potential d/c needs. pt endorses multiple SNF and [facility] (d/c 01/2024) admissions    Exercises Other Exercises Other Exercises: max encouragement to perform hand/finger squeezes, elbow flex/ext, shoulder flex/abd, ankle pumps, pt performing minimally endorsing pain   Assessment/Plan    PT Assessment Patient needs continued PT services  PT Problem List Decreased strength;Decreased range of motion;Decreased activity tolerance;Decreased balance;Decreased mobility;Decreased cognition;Decreased knowledge of use of DME;Decreased safety awareness;Pain;Decreased skin integrity;Obesity       PT Treatment Interventions DME instruction;Gait training;Stair training;Functional mobility training;Therapeutic activities;Therapeutic exercise;Balance training;Neuromuscular re-education;Cognitive remediation;Patient/family education;Wheelchair mobility training    PT Goals (Current goals can be found in the Care Plan section)  Acute Rehab PT Goals Patient Stated Goal: decreased pain PT Goal Formulation: With patient Time For Goal Achievement: 11/19/24 Potential to Achieve Goals: Fair    Frequency Min 2X/week     Co-evaluation  AM-PAC PT 6 Clicks Mobility  Outcome Measure Help  needed turning from your back to your side while in a flat bed without using bedrails?: Total Help needed moving from lying on your back to sitting on the side of a flat bed without using bedrails?: Total Help needed moving to and from a bed to a chair (including a wheelchair)?: Total Help needed standing up from a chair using your arms (e.g., wheelchair or bedside chair)?: Total Help needed to walk in hospital room?: Total Help needed climbing 3-5 steps with a railing? : Total 6 Click Score: 6    End of Session   Activity Tolerance: Patient limited by pain Patient left: in bed;with call bell/phone within reach (ED stretcher) Nurse Communication: Mobility status;Need for lift equipment PT Visit Diagnosis: Other abnormalities of gait and mobility (R26.89);Muscle weakness (generalized) (M62.81);Pain    Time: 8679-8654 PT Time Calculation (min) (ACUTE ONLY): 25 min   Charges:   PT Evaluation $PT Eval Moderate Complexity: 1 Mod PT Treatments $Therapeutic Activity: 8-22 mins PT General Charges $$ ACUTE PT VISIT: 1 Visit       Darice Almas, PT, DPT Acute Rehabilitation Services  Personal: Secure Chat Rehab Office: [phone]  Jamile Rekowski L Lawyer [place] 11/05/2024, 2:41 PM

## 2024-11-05 NOTE — Assessment & Plan Note (Signed)
 Will rehydrate, recehck CK in AM hold statin

## 2024-11-05 NOTE — [place] FL2 (Signed)
 " [place]  MEDICAID FL2 LEVEL OF CARE FORM     IDENTIFICATION  Patient Name: [name] Birthdate: [birth year] Sex: female Admission Date (Current Location): 11/04/2024  [facility] and [place] Number:  Producer, Television/film/video and Address:  [facility] [place], [address] [place], [name] [address]      Provider Number: [id number]  Attending Physician Name and Address:  Donnamarie Lebron PARAS, MD  Relative Name and Phone Number:  Anyae, Griffith, Emergency Contact  [phone]    Current Level of Care: Hospital Recommended Level of Care: Skilled Nursing Facility Prior Approval Number:    Date Approved/Denied:   PASRR Number: [id number]  Discharge Plan: SNF    Current Diagnoses: Patient Active Problem List   Diagnosis Date Noted   Hypoglycemia 11/05/2024   Hypothermia 11/05/2024   Rhabdomyolysis 11/05/2024   Weakness of both lower extremities 04/08/2024   Neurogenic bowel 01/20/2024   Neurogenic bladder 01/20/2024   Thoracic discitis 01/19/2024   Thoracic myelopathy 01/19/2024   Thyroid  dysfunction 01/14/2024   Irritable bowel syndrome 01/14/2024   Thoracic stenosis 01/08/2024   Thoracic spinal stenosis 01/08/2024   Obesity, Class III, BMI 40-49.9 (morbid obesity) (HCC) 12/30/2023   History of long term use of antibiotics due to prior spinal hardware infection MSSA and Enterobacter cloacae 12/30/2023   Osteomyelitis of thoracic region (HCC) 12/16/2023   Medication management 12/15/2023   Falls frequently 11/27/2023   Problem with vascular access--PICC line dislodgement 11/26/2023   T9 discitis/osteomyelitis and T9 epidural phlegmon/abscess s/p aspiration 10/29/2023 10/28/2023   Wound infection after surgery 05/14/2022   Delayed surgical wound healing 05/12/2022   Postoperative wound infection 04/08/2022   Scoliosis due to degenerative disease of spine in adult patient 03/30/2022   HNP (herniated nucleus pulposus), cervical 12/08/2020    Lumbago with sciatica, unspecified side 07/28/2020   PICC (peripherally inserted central catheter) in place 03/16/2020   Encounter for long-term (current) use of antibiotics 03/16/2020   Polypharmacy 03/09/2020   At risk for adverse drug event 03/09/2020   MSSA bacteremia 02/29/2020   Postoperative infection 02/28/2020   Abnormal gait due to muscle weakness 02/26/2020   HNP (herniated nucleus pulposus), lumbar 02/16/2020   Muscle weakness of lower extremity 02/14/2020   Spondylolisthesis of lumbar region 10/30/2017   Painful orthopaedic hardware right ankle 05/27/2016   Closed fracture of distal end of right fibula and tibia    Ankle syndesmosis disruption    ASCUS favor benign 01/06/2014   Leg swelling 12/22/2013   Seasonal allergies    Anemia    Chronic back pain due to DJD with history surgery    Hyperthyroidism    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 03/24/2012   Status post left total prosthetic replacement of knee joint using cement 03/24/2012   Hypokalemia 01/28/2012   Fibromyalgia    GERD (gastroesophageal reflux disease)    Anxiety    Depression    Hypertension    Lumbar degenerative disc disease 09/25/2011    Orientation RESPIRATION BLADDER Height & Weight     Self, Time, Situation, Place  O2 (2L O2) Incontinent, External catheter Weight: 252 lb 6.8 oz (114.5 kg) Height:  5' 3 (160 cm)  BEHAVIORAL SYMPTOMS/MOOD NEUROLOGICAL BOWEL NUTRITION STATUS      Continent Diet (Please see dc summary)  AMBULATORY STATUS COMMUNICATION OF NEEDS Skin   Extensive Assist Verbally Surgical wounds (Surgical Open Surgical Incision Vertebral column Posterior;Medial)  Personal Care Assistance Level of Assistance  Bathing, Feeding, Dressing Bathing Assistance:  (Please see dc summary) Feeding assistance:  (Please see dc summary) Dressing Assistance:  (Please see dc summary)     Functional Limitations Info  Sight, Hearing, Speech  Sight Info: Adequate Hearing Info: Adequate Speech Info: Adequate    SPECIAL CARE FACTORS FREQUENCY  PT (By licensed PT), OT (By licensed OT)     PT Frequency: 5x week OT Frequency: 5x week            Contractures Contractures Info: Not present    Additional Factors Info  Code Status, Allergies Code Status Info: Full Allergies Info: Monosodium Glutamate, Shellfish Allergy, Melatonin, Baclofen, Celebrex  (Celecoxib ), Ciprofloxacin , Contrast Media (Iodinated Contrast Media), Diclofenac , Gadolinium, Molds & Smuts, Other, Promethazine , Statins, Sulfa  Antibiotics, Latex           Current Medications (11/05/2024):  This is the current hospital active medication list Current Facility-Administered Medications  Medication Dose Route Frequency Provider Last Rate Last Admin   acetaminophen  (TYLENOL ) tablet 650 mg  650 mg Oral Q6H PRN Doutova, Anastassia, MD   650 mg at 11/05/24 0422   Or   acetaminophen  (TYLENOL ) suppository 650 mg  650 mg Rectal Q6H PRN Doutova, Anastassia, MD       albuterol  (PROVENTIL ) (2.5 MG/3ML) 0.083% nebulizer solution 2.5 mg  2.5 mg Inhalation Q6H PRN Ezenduka, Nkeiruka J, MD       aspirin  EC tablet 81 mg  81 mg Oral q AM Doutova, Anastassia, MD   81 mg at 11/05/24 9046   atenolol  (TENORMIN ) tablet 50 mg  50 mg Oral Daily Doutova, Anastassia, MD   50 mg at 11/05/24 9046   dextrose  5 % and 0.45 % NaCl infusion   Intravenous Continuous Doutova, Anastassia, MD 75 mL/hr at 11/05/24 1931 New Bag at 11/05/24 1931   diazepam  (VALIUM ) tablet 2 mg  2 mg Oral BID PRN Ezenduka, Nkeiruka J, MD       doxycycline  (VIBRA -TABS) tablet 100 mg  100 mg Oral BID Doutova, Anastassia, MD   100 mg at 11/05/24 0953   heparin  injection 5,000 Units  5,000 Units Subcutaneous Q8H Ezenduka, Nkeiruka J, MD       HYDROcodone -acetaminophen  (NORCO/VICODIN) 5-325 MG per tablet 1 tablet  1 tablet Oral Q6H PRN Ezenduka, Nkeiruka J, MD       lubiprostone  (AMITIZA ) capsule 48 mcg  48 mcg Oral Q lunch  Doutova, Anastassia, MD       methocarbamol  (ROBAXIN ) tablet 500 mg  500 mg Oral QID Doutova, Anastassia, MD   500 mg at 11/05/24 1440   metoprolol  tartrate (LOPRESSOR ) injection 2.5 mg  2.5 mg Intravenous Q5 min PRN Sundil, Subrina, MD       ondansetron  (ZOFRAN ) tablet 4 mg  4 mg Oral Q6H PRN Doutova, Anastassia, MD       Or   ondansetron  (ZOFRAN ) injection 4 mg  4 mg Intravenous Q6H PRN Doutova, Anastassia, MD       pantoprazole  (PROTONIX ) EC tablet 80 mg  80 mg Oral Daily Doutova, Anastassia, MD   80 mg at 11/05/24 9046   thiamine  (VITAMIN B1) 500 mg in sodium chloride  0.9 % 50 mL IVPB  500 mg Intravenous Daily Doutova, Anastassia, MD   Stopped at 11/05/24 0306     Discharge Medications: Please see discharge summary for a list of discharge medications.  Relevant Imaging Results:  Relevant Lab Results:   Additional Information SS#: [id number]  Keyoni Lapinski C Keondra Haydu, LCSWA     "

## 2024-11-05 NOTE — ED Notes (Signed)
 Nurse called floor and asked if they were ready for pt at this time. Nurse told to give them 5 more minutes

## 2024-11-05 NOTE — Progress Notes (Signed)
 "  PROGRESS NOTE  NYOMI [name] FMW:[id number] DOB: [birth year] DOA: 11/04/2024 PCP: Caleen Dirks, MD  HPI/Recap of past 24 hours: [name] is a 64 y.o. female with medical history significant of fibromyalgia, thoracic stenosis sp thoracic decompression September 2025, GERD, HTN, Hyperthyroidism, IBS, CSF leak presented after being found down by sister who lives about 10 minutes away from patient.  Patient never passed out or lost consciousness.  Patient had been down for about over 24 hours.  In the ED, patient was noted to be hypothermic and hypoglycemic.  Does have a small postop wound at her back which has been intermittently draining and has been followed by neurosurgery.  Patient with bilateral lower extremity edema, and some upper extremity as well.  Patient lives alone, and ambulates with a cane/walker.  While she had fallen, patient had no access to food or water and denies hitting her head.  Vital signs showed hypothermia, tachycardic, BP stable and saturating well on room air.  Labs showed glucose 62, CK 2259, D-dimer 0.52, EKG with sinus tachycardia, UA unremarkable for infection, chest x-ray unremarkable for any infection.  Patient admitted for further management.    Today, patient denies any new complaints, noted to have significant BLE edema as well as BUE edema, denies any cough/chest pain/SOB/fever/chills, nausea/vomiting, diarrhea.  Noted to be sleepy this a.m., although answers questions appropriately but slowly.    Assessment/Plan: Principal Problem:   Hypoglycemia Active Problems:   Rhabdomyolysis   Polypharmacy   Fibromyalgia   Hypertension   Anemia   Hyperthyroidism   Delayed surgical wound healing   Falls frequently   Obesity, Class III, BMI 40-49.9 (morbid obesity) (HCC)   Hypothermia   Unwitnessed fall Altered mental status Patient reported mechanical, slipped and fell out of bed, unable to get up for greater than 24 hours X-ray of hip unremarkable CT  head, cervical/thoracic/lumbar spine pending Hold sedating meds for today PT/OT-recommend SNF Fall precautions  Rhabdomyolysis CK 2,259-->1,645, trend Continue IV fluids  Hypoglycemia Resolved Likely in the setting of poor oral intake Continue IV fluids, diet Serial CBGs  Hypothermia Resolved No heating in the house S/p Bair hugger  Delayed surgical wound healing following thoracic decompression in September 2025 Followed by neurosurgery patient BC x 2 pending, lactic acid WNL Wound care consulted Continue doxycycline  for now  Elevated D-dimer Noted sinus tachycardia D-dimer 0.52 CTA chest negative for PE, no acute airspace disease  Doppler B/L pending  Chronic diastolic HF proBNP 563 Noted BLE edema,??  Lymphedema Last echo on 07/2024 showed EF of 70 to 75%, no regional wall motion abnormality, grade 1 diastolic dysfunction, moderately elevated pulmonary artery systolic pressure May require small dose Lasix  due to edema, will hold off for now  Normocytic anemia Baseline hemoglobin around 11 Anemia panel WNL Monitor with intermittent CBC   Hypertension BP stable Continue atenolol , hold HCTZ for now  Hyperthyroidism TSH-->0.393 Appears stable, not on any medication  Fibromyalgia Chronic pain meds dependency/polypharmacy Noted to be on multiple muscle relaxants/pain meds including Parafon , Flexeril , Valium , high-dose gabapentin  1200 mg 3 times daily, Percocet 10-325 mg 5 times daily, Benadryl  50 mg 4 times daily For now continue robaxin , Norco as needed, Valium  as needed, hold other medications listed above, plan to adjust once patient more awake  Obesity, Class 2 Contributing to comorbidity and complicating medical management  Body mass index is 38.97 kg/m. Lifestyle modification advised       Estimated body mass index is 38.97 kg/m as calculated from the  following:   Height as of this encounter: 5' 3 (1.6 m).   Weight as of this encounter: 99.8 kg.      Code Status: Full  Family Communication: None at bedside  Disposition Plan: Status is: Inpatient Remains inpatient appropriate because: Level of care      Consultants: None  Procedures: None  Antimicrobials: None  DVT prophylaxis: Lovenox    Objective: Vitals:   11/05/24 0953 11/05/24 0954 11/05/24 0958 11/05/24 1441  BP:   (!) 121/93 135/70  Pulse:   (!) 115 83  Resp:   (!) 22 (!) 23  Temp: 98.9 F (37.2 C)   97.8 F (36.6 C)  TempSrc: Oral   Oral  SpO2:  98% 99% 100%  Weight:      Height:       No intake or output data in the 24 hours ending 11/05/24 1459 Filed Weights   11/04/24 2024  Weight: 99.8 kg    Exam: General: NAD, sleepy Cardiovascular: S1, S2 present Respiratory: CTAB Abdomen: Soft, nontender, nondistended, bowel sounds present Musculoskeletal: 3+ bilateral pedal edema noted Skin: Normal Psychiatry: Normal mood     Data Reviewed: CBC: Recent Labs  Lab 11/04/24 2102 11/05/24 0615  WBC 10.3 7.6  HGB 11.8* 9.9*  HCT 39.4 32.8*  MCV 81.1 80.4  PLT 304 279   Basic Metabolic Panel: Recent Labs  Lab 11/04/24 2102 11/05/24 0615  NA 143 143  K 4.1 3.8  CL 105 107  CO2 22 26  GLUCOSE 62* 132*  BUN 10 8  CREATININE 0.44 0.50  CALCIUM  9.4 8.5*  MG  --  1.9  PHOS  --  3.2  3.2   GFR: Estimated Creatinine Clearance: 82.2 mL/min (by C-G formula based on SCr of 0.5 mg/dL). Liver Function Tests: Recent Labs  Lab 11/04/24 2102 11/05/24 0615  AST 103* 86*  ALT 43 37  ALKPHOS 172* 133*  BILITOT <0.2 <0.2  PROT 6.4* 5.1*  ALBUMIN  3.8 3.1*   No results for input(s): LIPASE, AMYLASE in the last 168 hours. No results for input(s): AMMONIA in the last 168 hours. Coagulation Profile: No results for input(s): INR, PROTIME in the last 168 hours. Cardiac Enzymes: Recent Labs  Lab 11/04/24 2102 11/05/24 0615  CKTOTAL 2,259* 1,645*   BNP (last 3 results) Recent Labs    11/05/24 0615  PROBNP 563.0*    HbA1C: No results for input(s): HGBA1C in the last 72 hours. CBG: Recent Labs  Lab 11/05/24 0601 11/05/24 0829 11/05/24 1043 11/05/24 1309 11/05/24 1437  GLUCAP 128* 94 113* 102* 111*   Lipid Profile: No results for input(s): CHOL, HDL, LDLCALC, TRIG, CHOLHDL, LDLDIRECT in the last 72 hours. Thyroid  Function Tests: No results for input(s): TSH, T4TOTAL, FREET4, T3FREE, THYROIDAB in the last 72 hours. Anemia Panel: Recent Labs    11/05/24 0615  VITAMINB12 >4,000*  FOLATE >20.0  FERRITIN 35  TIBC 273  IRON 44  RETICCTPCT 1.4   Urine analysis:    Component Value Date/Time   COLORURINE YELLOW 11/04/2024 2300   APPEARANCEUR HAZY (A) 11/04/2024 2300   LABSPEC 1.015 11/04/2024 2300   PHURINE 5.0 11/04/2024 2300   GLUCOSEU NEGATIVE 11/04/2024 2300   HGBUR NEGATIVE 11/04/2024 2300   BILIRUBINUR NEGATIVE 11/04/2024 2300   BILIRUBINUR NEG 12/30/2013 1603   KETONESUR 80 (A) 11/04/2024 2300   PROTEINUR NEGATIVE 11/04/2024 2300   UROBILINOGEN 0.2 12/30/2013 1603   UROBILINOGEN 0.2 04/09/2012 0649   NITRITE NEGATIVE 11/04/2024 2300   LEUKOCYTESUR NEGATIVE 11/04/2024 2300  Sepsis Labs: @LABRCNTIP (procalcitonin:4,lacticidven:4)  )No results found for this or any previous visit (from the past 240 hours).    Studies: DG CHEST PORT 1 VIEW Result Date: 11/05/2024 EXAM: 1 VIEW(S) XRAY OF THE CHEST 11/05/2024 01:07:00 AM COMPARISON: Chest x-ray 07/22/2024, CT angio chest 07/25/2024. CLINICAL HISTORY: 809823 Fall 809823 Fall. FINDINGS: LUNGS AND PLEURA: Lingular linear atelectasis versus scarring. Increased interstitial markings. No pleural effusion. No pneumothorax. HEART AND MEDIASTINUM: No acute abnormality of the cardiac and mediastinal silhouettes. BONES AND SOFT TISSUES: No acute osseous abnormality. Cervical spine surgical hardware. IMPRESSION: 1. No acute findings. Electronically signed by: Morgane Naveau MD 11/05/2024 01:14 AM EST RP Workstation:  [id number]   DG HIP UNILAT WITH PELVIS 2-3 VIEWS RIGHT Result Date: 11/04/2024 EXAM: 2-3 VIEW(S) XRAY OF THE RIGHT HIP 11/04/2024 09:34:21 PM COMPARISON: None available. CLINICAL HISTORY: Fall; pain. FINDINGS: BONES AND JOINTS: There is mild osteopenia with no evidence of fracture or dislocation of the right hip. There is enthesopathic spurring of the right iliac wing and greater trochanter, mild nonerosive arthrosis of the right hip and symphysis pubis. The right SI joint is patent. There is postsurgical change in the lower lumbar spine. SOFT TISSUES: Soft tissues are unremarkable, with overlying clothing artifacts and monitor wires. IMPRESSION: 1. No acute fracture or dislocation of the right hip. 2. Osteopenia and degenerative change. Electronically signed by: Francis Quam MD 11/04/2024 10:14 PM EST RP Workstation: [id number]   DG HIP UNILAT WITH PELVIS 2-3 VIEWS LEFT Result Date: 11/04/2024 EXAM: 2 or 3 VIEW(S) XRAY OF THE LEFT HIP 11/04/2024 09:34:21 PM COMPARISON: None available. CLINICAL HISTORY: Fall; pain. FINDINGS: BONES AND JOINTS: There is mild osteopenia. No evidence of pelvic fracture or diastasis. No evidence of left hip fracture or dislocation. No malalignment. There is enthesopathic spurring of the iliac wings, spurring at the symphysis pubis, and mild nonerosive arthrosis of the hip joints. LUMBAR SPINE: There is fusion hardware in the lower lumbar spine. SOFT TISSUES: There are artifacts from overlying clothing. IMPRESSION: 1. No evidence of left hip or pelvic fracture or dislocation. 2. Osteopenia, degenerative and postsurgical changes. Electronically signed by: Francis Quam MD 11/04/2024 10:03 PM EST RP Workstation: [id number]   DG Lumbar Spine Complete Result Date: 11/04/2024 EXAM: 4+ VIEW(S) XRAY OF THE LUMBAR SPINE 11/04/2024 09:32:16 PM COMPARISON: MRI lumbar spine 05/26/2024. CLINICAL HISTORY: Fall, pain. FINDINGS: LUMBAR SPINE: BONES: Vertebral body heights are maintained. 5  lumbar segments are noted. There is mild levoscoliosis with the apex at L3. Extensive fusion hardware is present, including posterior fusion rods and pedicle screws from T10 through L4, separate posterior fusion rods and L3 to L5 with pedicle screws at L3 and L5 , old anterior fusion hardware at L5-S1, and interbody disc space hardware from L2 through S1. Connecting struts are present at the L3 level between the upper and lower components of the fusion hardware. All hardware appears intact. There is up to 3 mm lucency around the L1 pedicle screws and 2 mm lucency around the L5 pedicle screws, suggesting a degree of loosening. There is no other overt hardware loosening. The most recent lumbar CT was on 10/12/2021 prior to the placement of the T10 through L4 hardware. The lucency around the L5 pedicle screws was present at that time. No acute fracture is seen. DISCS AND DEGENERATIVE CHANGES: Fusions appear solid at L1-2 and L5-S1. The other disc spaces appear patent as well as can be seen. There is a chronic grade 1 anterolisthesis at L3-4 and L4-5 which is unchanged. Moderate  marginal osteophytosis is also seen. Posterior decompression changes are present at L3-4 and L4-5. Both SI joints are patent. SOFT TISSUES: No acute abnormality. IMPRESSION: 1. No lumbar fracture is evident. 2. Extensive thoracolumbar fusion hardware appears intact, with lucency around the L1 and L5 pedicle screws suggesting loosening. 3. Degenerative changes and chronic grade 1 anterolisthesis at 2 levels. Electronically signed by: Francis Quam MD 11/04/2024 09:57 PM EST RP Workstation: [id number]   DG Thoracic Spine 2 View Result Date: 11/04/2024 EXAM: 2 VIEW(S) XRAY OF THE THORACIC SPINE 11/04/2024 09:32:16 PM COMPARISON: 06/29/2024 CLINICAL HISTORY: fall, pain. Fall; pain. FINDINGS: LIMITATIONS: Limited evaluation due to overlapping osseous structures and overlying soft tissues. BONES: Vertebral body heights are maintained, except for  chronic similar-appearing T9 and T10 compression fractures. Alignment is normal. Thoracolumbar surgical hardware starts at the T10 level. No surgical hardware fracture. DISCS AND DEGENERATIVE CHANGES: No severe degenerative changes. SOFT TISSUES: The visualized lungs are clear. IMPRESSION: 1. Thoracolumbar surgical hardware starting at the T10 level, without hardware fracture. 2. No acute findings. 3. Chronic similar-appearing T9 and T10 compression fractures. Electronically signed by: Morgane Naveau MD 11/04/2024 09:53 PM EST RP Workstation: [id number]    Scheduled Meds:  aspirin  EC  81 mg Oral q AM   atenolol   50 mg Oral Daily   doxycycline   100 mg Oral BID   lubiprostone   48 mcg Oral Q lunch   methocarbamol   500 mg Oral QID   pantoprazole   80 mg Oral Daily    Continuous Infusions:  dextrose  5 % and 0.45 % NaCl 75 mL/hr at 11/05/24 0956   thiamine  (VITAMIN B1) injection Stopped (11/05/24 0306)     LOS: 0 days     Lebron JINNY Cage, MD [facility]  If 7PM-7AM, please contact night-coverage [email or web address] 11/05/2024, 2:59 PM    "

## 2024-11-05 NOTE — ED Notes (Signed)
 Bair hugger removed at this time

## 2024-11-05 NOTE — Assessment & Plan Note (Signed)
Continue robaxin.

## 2024-11-06 DIAGNOSIS — E162 Hypoglycemia, unspecified: Secondary | ICD-10-CM | POA: Diagnosis not present

## 2024-11-06 LAB — BASIC METABOLIC PANEL WITH GFR
Anion gap: 10 (ref 5–15)
BUN: 8 mg/dL (ref 8–23)
CO2: 26 mmol/L (ref 22–32)
Calcium: 8.5 mg/dL — ABNORMAL LOW (ref 8.9–10.3)
Chloride: 108 mmol/L (ref 98–111)
Creatinine, Ser: 0.5 mg/dL (ref 0.44–1.00)
GFR, Estimated: >60 mL/min
Glucose, Bld: 87 mg/dL (ref 70–99)
Potassium: 3.8 mmol/L (ref 3.5–5.1)
Sodium: 144 mmol/L (ref 135–145)

## 2024-11-06 LAB — CBC WITH DIFFERENTIAL/PLATELET
Abs Immature Granulocytes: 0.02 10*3/uL (ref 0.00–0.07)
Basophils Absolute: 0 10*3/uL (ref 0.0–0.1)
Basophils Relative: 0 %
Eosinophils Absolute: 0.2 10*3/uL (ref 0.0–0.5)
Eosinophils Relative: 3 %
HCT: 32.1 % — ABNORMAL LOW (ref 36.0–46.0)
Hemoglobin: 9.7 g/dL — ABNORMAL LOW (ref 12.0–15.0)
Immature Granulocytes: 0 %
Lymphocytes Relative: 35 %
Lymphs Abs: 2.6 10*3/uL (ref 0.7–4.0)
MCH: 24.3 pg — ABNORMAL LOW (ref 26.0–34.0)
MCHC: 30.2 g/dL (ref 30.0–36.0)
MCV: 80.3 fL (ref 80.0–100.0)
Monocytes Absolute: 0.7 10*3/uL (ref 0.1–1.0)
Monocytes Relative: 10 %
Neutro Abs: 3.9 10*3/uL (ref 1.7–7.7)
Neutrophils Relative %: 52 %
Platelets: 276 10*3/uL (ref 150–400)
RBC: 4 MIL/uL (ref 3.87–5.11)
RDW: 16.7 % — ABNORMAL HIGH (ref 11.5–15.5)
WBC: 7.5 10*3/uL (ref 4.0–10.5)
nRBC: 0 % (ref 0.0–0.2)

## 2024-11-06 LAB — GLUCOSE, CAPILLARY
Glucose-Capillary: 96 mg/dL (ref 70–99)
Glucose-Capillary: 98 mg/dL (ref 70–99)
Glucose-Capillary: 95 mg/dL (ref 70–99)
Glucose-Capillary: 90 mg/dL (ref 70–99)

## 2024-11-06 LAB — CK: Total CK: 1080 U/L — ABNORMAL HIGH (ref 38–234)

## 2024-11-06 MED ORDER — SODIUM CHLORIDE 0.9 % IV SOLN: INTRAVENOUS | Status: DC

## 2024-11-06 NOTE — Progress Notes (Signed)
 "  PROGRESS NOTE  [name] FMW:[id number] DOB: 04-Aug-1961 DOA: 11/04/2024 PCP: Leonel Cole, MD  HPI/Recap of past 24 hours: [name] is a 64 y.o. female with medical history significant of fibromyalgia, thoracic stenosis sp thoracic decompression September 2025, GERD, HTN, Hyperthyroidism, IBS, CSF leak presented after being found down by sister who lives about 10 minutes away from patient.  Patient never passed out or lost consciousness.  Patient had been down for about over 24 hours.  In the ED, patient was noted to be hypothermic and hypoglycemic.  Does have a small postop wound at her back which has been intermittently draining and has been followed by neurosurgery.  Patient with bilateral lower extremity edema, and some upper extremity as well.  Patient lives alone, and ambulates with a cane/walker.  While she had fallen, patient had no access to food or water and denies hitting her head.  Vital signs showed hypothermia, tachycardic, BP stable and saturating well on room air.  Labs showed glucose 62, CK 2259, D-dimer 0.52, EKG with sinus tachycardia, UA unremarkable for infection, chest x-ray unremarkable for any infection.  Patient admitted for further management.    Today, pt denies any new complaints.      Assessment/Plan: Principal Problem:   Hypoglycemia Active Problems:   Rhabdomyolysis   Polypharmacy   Fibromyalgia   Hypertension   Anemia   Hyperthyroidism   Delayed surgical wound healing   Falls frequently   Obesity, Class III, BMI 40-49.9 (morbid obesity) (HCC)   Hypothermia   Unwitnessed fall Altered mental status- resolved Patient reported mechanical, slipped and fell out of bed, unable to get up for greater than 24 hours X-ray of hip unremarkable CT head, cervical/thoracic/lumbar spine unremarkable  Continue to hold sedating meds PT/OT-recommend SNF Fall precautions  Rhabdomyolysis CK 2,259-->1,645--> 1080, trend Continue IV  fluids  Hypoglycemia Resolved Likely in the setting of poor oral intake Serial CBGs  Hypothermia Resolved Likely due to poor heating in the house S/p Bair hugger  Delayed surgical wound healing following thoracic decompression in September 2025 Followed by neurosurgery patient BC x 2 NGTD, lactic acid WNL Wound care consulted Continue doxycycline  for now  Elevated D-dimer Noted sinus tachycardia D-dimer 0.52 CTA chest negative for PE, no acute airspace disease  Doppler B/L negative for DVT  Chronic diastolic HF proBNP 563 Noted BLE edema,??  Lymphedema Last echo on 07/2024 showed EF of 70 to 75%, no regional wall motion abnormality, grade 1 diastolic dysfunction, moderately elevated pulmonary artery systolic pressure May require small dose Lasix  due to edema, will hold off for now  Normocytic anemia Baseline hemoglobin around 11 Anemia panel WNL Monitor with intermittent CBC   Hypertension BP stable Continue atenolol , hold HCTZ for now  Hyperthyroidism TSH-->0.393 Appears stable, not on any medication  Fibromyalgia Chronic pain meds dependency/polypharmacy Noted to be on multiple muscle relaxants/pain meds including Parafon , Flexeril , Valium , high-dose gabapentin  1200 mg 3 times daily, Percocet 10-325 mg 5 times daily, Benadryl  50 mg 4 times daily For now continue robaxin , Norco as needed, Valium  as needed, hold other medications listed above, plan to adjust once patient more awake  Morbid obesity Contributing to comorbidity and complicating medical management  Lifestyle modification advised       Estimated body mass index is 44.72 kg/m as calculated from the following:   Height as of this encounter: 5' 3 (1.6 m).   Weight as of this encounter: 114.5 kg.     Code Status: Full  Family Communication:  None at bedside  Disposition Plan: Status is: Inpatient Remains inpatient appropriate because: Level of  care      Consultants: None  Procedures: None  Antimicrobials: None  DVT prophylaxis: Heparin    Objective: Vitals:   11/06/24 0750 11/06/24 0915 11/06/24 1210 11/06/24 1545  BP: (!) 146/68  113/64 (!) 156/73  Pulse: 86 87 69 76  Resp: 19 (!) 21 18 20   Temp: 98.7 F (37.1 C)  98 F (36.7 C) 98 F (36.7 C)  TempSrc: Axillary  Axillary Oral  SpO2: (!) 89% 99% 90% 100%  Weight:      Height:        Intake/Output Summary (Last 24 hours) at 11/06/2024 1633 Last data filed at 11/06/2024 0930 Gross per 24 hour  Intake 1829.65 ml  Output 800 ml  Net 1029.65 ml   Filed Weights   11/04/24 2024 11/05/24 1644  Weight: 99.8 kg 114.5 kg    Exam: General: NAD Cardiovascular: S1, S2 present Respiratory: CTAB Abdomen: Soft, nontender, nondistended, bowel sounds present Musculoskeletal: 2+ bilateral pedal edema noted Skin: Normal Psychiatry: Normal mood     Data Reviewed: CBC: Recent Labs  Lab 11/04/24 2102 11/05/24 0615 11/06/24 0215  WBC 10.3 7.6 7.5  NEUTROABS  --   --  3.9  HGB 11.8* 9.9* 9.7*  HCT 39.4 32.8* 32.1*  MCV 81.1 80.4 80.3  PLT 304 279 276   Basic Metabolic Panel: Recent Labs  Lab 11/04/24 2102 11/05/24 0615 11/06/24 0215  NA 143 143 144  K 4.1 3.8 3.8  CL 105 107 108  CO2 22 26 26   GLUCOSE 62* 132* 87  BUN 10 8 8   CREATININE 0.44 0.50 0.50  CALCIUM  9.4 8.5* 8.5*  MG  --  1.9  --   PHOS  --  3.2  3.2  --    GFR: Estimated Creatinine Clearance: 88.9 mL/min (by C-G formula based on SCr of 0.5 mg/dL). Liver Function Tests: Recent Labs  Lab 11/04/24 2102 11/05/24 0615  AST 103* 86*  ALT 43 37  ALKPHOS 172* 133*  BILITOT <0.2 <0.2  PROT 6.4* 5.1*  ALBUMIN  3.8 3.1*   No results for input(s): LIPASE, AMYLASE in the last 168 hours. No results for input(s): AMMONIA in the last 168 hours. Coagulation Profile: No results for input(s): INR, PROTIME in the last 168 hours. Cardiac Enzymes: Recent Labs  Lab  11/04/24 2102 11/05/24 0615 11/06/24 0215  CKTOTAL 2,259* 1,645* 1,080*   BNP (last 3 results) Recent Labs    11/05/24 0615  PROBNP 563.0*   HbA1C: No results for input(s): HGBA1C in the last 72 hours. CBG: Recent Labs  Lab 11/05/24 1739 11/05/24 2150 11/06/24 0625 11/06/24 1206 11/06/24 1543  GLUCAP 97 103* 96 98 95   Lipid Profile: No results for input(s): CHOL, HDL, LDLCALC, TRIG, CHOLHDL, LDLDIRECT in the last 72 hours. Thyroid  Function Tests: No results for input(s): TSH, T4TOTAL, FREET4, T3FREE, THYROIDAB in the last 72 hours. Anemia Panel: Recent Labs    11/05/24 0615  VITAMINB12 >4,000*  FOLATE >20.0  FERRITIN 35  TIBC 273  IRON 44  RETICCTPCT 1.4   Urine analysis:    Component Value Date/Time   COLORURINE YELLOW 11/04/2024 2300   APPEARANCEUR HAZY (A) 11/04/2024 2300   LABSPEC 1.015 11/04/2024 2300   PHURINE 5.0 11/04/2024 2300   GLUCOSEU NEGATIVE 11/04/2024 2300   HGBUR NEGATIVE 11/04/2024 2300   BILIRUBINUR NEGATIVE 11/04/2024 2300   BILIRUBINUR NEG 12/30/2013 1603   KETONESUR 80 (A) 11/04/2024 2300  PROTEINUR NEGATIVE 11/04/2024 2300   UROBILINOGEN 0.2 12/30/2013 1603   UROBILINOGEN 0.2 04/09/2012 0649   NITRITE NEGATIVE 11/04/2024 2300   LEUKOCYTESUR NEGATIVE 11/04/2024 2300   Sepsis Labs: @LABRCNTIP (procalcitonin:4,lacticidven:4)  ) Recent Results (from the past 240 hours)  Culture, blood (Routine X 2) w Reflex to ID Panel     Status: None (Preliminary result)   Collection Time: 11/05/24  6:49 AM   Specimen: BLOOD  Result Value Ref Range Status   Specimen Description BLOOD BLOOD RIGHT ARM  Final   Special Requests   Final    BOTTLES DRAWN AEROBIC AND ANAEROBIC Blood Culture adequate volume   Culture   Final    NO GROWTH < 24 HOURS Performed at [facility], [address], [place], [name] [address]    Report Status PENDING  Incomplete  Culture, blood (Routine X 2) w Reflex to ID Panel     Status:  None (Preliminary result)   Collection Time: 11/05/24  8:44 AM   Specimen: BLOOD  Result Value Ref Range Status   Specimen Description BLOOD BLOOD RIGHT HAND  Final   Special Requests   Final    BOTTLES DRAWN AEROBIC AND ANAEROBIC Blood Culture results may not be optimal due to an inadequate volume of blood received in culture bottles   Culture   Final    NO GROWTH < 24 HOURS Performed at [facility], [address] [place], [name] [address]    Report Status PENDING  Incomplete      Studies: CT HEAD WO CONTRAST ( ) Result Date: 11/05/2024 CLINICAL DATA:  Initial evaluation for acute mental status change, fall. EXAM: CT HEAD WITHOUT CONTRAST TECHNIQUE: Contiguous axial images were obtained from the base of the skull through the vertex without intravenous contrast. RADIATION DOSE REDUCTION: This exam was performed according to the departmental dose-optimization program which includes automated exposure control, adjustment of the mA and/or kV according to patient size and/or use of iterative reconstruction technique. COMPARISON:  Prior study from 03/17/2022. FINDINGS: Brain: Cerebral volume within normal limits for patient age. No acute intracranial hemorrhage. No acute large vessel territory infarct. No mass lesion, midline shift, or mass effect. Ventricles are normal in size without hydrocephalus. No extra-axial fluid collection. Vascular: No abnormal hyperdense vessel. Skull: Scalp soft tissues demonstrate no acute abnormality. Calvarium intact. Sinuses/Orbits: Globes and orbital soft tissues within normal limits. Visualized paranasal sinuses are largely clear. No significant mastoid effusion. IMPRESSION: Normal head CT. No acute intracranial abnormality. Electronically Signed   By: Morene Hoard M.D.   On: 11/05/2024 23:02   CT LUMBAR SPINE WO CONTRAST Result Date: 11/05/2024 EXAM: CT OF THE LUMBAR SPINE WITHOUT CONTRAST 11/05/2024 09:20:38 PM TECHNIQUE: CT of the lumbar spine  was performed without the administration of intravenous contrast. Multiplanar reformatted images are provided for review. Automated exposure control, iterative reconstruction, and/or weight based adjustment of the mA/kV was utilized to reduce the radiation dose to as low as reasonably achievable. COMPARISON: Radiographs S1 / 29 / 26 and MRI 8 / 20 / 25. CLINICAL HISTORY: fall Fall. FINDINGS: BONES AND ALIGNMENT: Posterior thoracolumbar fusion from T10 - L5. Anterior fusion L5 - S1. Normal vertebral body heights. No evidence of acute fracture or traumatic malalignment. Lucencies around the bilateral L3 screws and the right L5 screws compatible with loosening. DEGENERATIVE CHANGES: Evaluation of the spinal canal and neural foramina is limited due to streak artifact. SOFT TISSUES: No acute abnormality. IMPRESSION: 1. No evidence of acute fracture or traumatic  malalignment. Electronically signed by: Norman Gatlin MD 11/05/2024 10:45 PM EST RP Workstation: [id number]   CT THORACIC SPINE WO CONTRAST Result Date: 11/05/2024 EXAM: CT THORACIC SPINE WITHOUT CONTRAST 11/05/2024 09:20:38 PM TECHNIQUE: CT of the thoracic spine was performed without the administration of intravenous contrast. Multiplanar reformatted images are provided for review. Automated exposure control, iterative reconstruction, and/or weight based adjustment of the mA/kV was utilized to reduce the radiation dose to as low as reasonably achievable. COMPARISON: Comparison with x-ray 11/04/2024. Comparison with MRI 10/27/2023. CLINICAL HISTORY: fall Fall. FINDINGS: BONES AND ALIGNMENT: Normal vertebral body heights. No acute fracture or suspicious bone lesion. Normal alignment. Thoracolumbar posterior fusion starting at T10. DEGENERATIVE CHANGES: Multilevel spondylosis, disc space height loss, and degenerative endplate changes which is advanced at C7 - T1, T3 - T4, T8 - T9, T9 - T10, T10 - T11. Advanced facet arthropathy at T9 - T10. Ongoing severe spinal  canal narrowing at T9 - T10 better appreciated on prior MRI. SOFT TISSUES: No acute abnormality. Findings in the chest reported separately on same day CT chest. IMPRESSION: 1. No acute abnormality of the thoracic spine related to the fall. 2. Multilevel spondylosis similar to prior MRI. Ongoing severe spinal canal narrowing at T9 - T10. Electronically signed by: Norman Gatlin MD 11/05/2024 10:40 PM EST RP Workstation: [id number]   CT CERVICAL SPINE WO CONTRAST Result Date: 11/05/2024 EXAM: CT CERVICAL SPINE WITHOUT CONTRAST 11/05/2024 09:20:38 PM TECHNIQUE: CT of the cervical spine was performed without the administration of intravenous contrast. Multiplanar reformatted images are provided for review. Automated exposure control, iterative reconstruction, and/or weight based adjustment of the mA/kV was utilized to reduce the radiation dose to as low as reasonably achievable. COMPARISON: Comparison with MRI 10/28/2023. CLINICAL HISTORY: fall Fall. FINDINGS: BONES AND ALIGNMENT: Anterior fusion C3 - C5 and C6 - C7. Ankylosis of the C3 - C5 facets bilaterally. No acute fracture or traumatic malalignment. DEGENERATIVE CHANGES: No severe spinal canal narrowing. SOFT TISSUES: No prevertebral soft tissue swelling. IMPRESSION: 1. No acute findings. Electronically signed by: Norman Gatlin MD 11/05/2024 10:35 PM EST RP Workstation: [id number]    Scheduled Meds:  aspirin  EC  81 mg Oral q AM   atenolol   50 mg Oral Daily   doxycycline   100 mg Oral BID   heparin  injection (subcutaneous)  5,000 Units Subcutaneous Q8H   lubiprostone   48 mcg Oral Q lunch   methocarbamol   500 mg Oral QID   pantoprazole   80 mg Oral Daily    Continuous Infusions:  sodium chloride  50 mL/hr at 11/06/24 9072   thiamine  (VITAMIN B1) injection 500 mg (11/06/24 0929)     LOS: 1 day     Lebron JINNY Cage, MD [facility]  If 7PM-7AM, please contact night-coverage [email or web address] 11/06/2024, 4:33 PM    "

## 2024-11-06 NOTE — Plan of Care (Signed)
  Problem: Education: Goal: Knowledge of General Education information will improve Description: Including pain rating scale, medication(s)/side effects and non-pharmacologic comfort measures Outcome: Progressing   Problem: Nutrition: Goal: Adequate nutrition will be maintained Outcome: Progressing   Problem: Pain Managment: Goal: General experience of comfort will improve and/or be controlled Outcome: Progressing   Problem: Activity: Goal: Risk for activity intolerance will decrease Outcome: Not Progressing

## 2024-11-06 NOTE — Evaluation (Signed)
 Occupational Therapy Evaluation Patient Details Name: [name] MRN: [id number] DOB: 10-18-1960 Today's Date: 11/06/2024   History of Present Illness   64 y.o. female presenting 11/04/24 after unwitnessed fall, reportedly found down >24 hr later by son, denies hitting head; found to be hypothermic, hypoglycemic, has small back wound (pt reports h/o open area around thoracic incision that drains, neurosx following).  Of note, s/p T9-10 laminectomy 06/2024. Other PMH includes multiple back/cervical sxs, CSF leak, HTN, fibromyalgia, anxiety, IBS.     Clinical Impressions Pt admitted based on above, and was seen based on problem list below. Unsure of pt's true PLOF d/t pt with limited verbalizations throughout session, mainly c/o of pain. Today pt is requiring set up  to total +2 assist for ADLs. Bed mobility and functional transfers are  max +2 . Pt limited by c/o of pain and need to use the bathroom. Unable to stand long enough with max +2 assist to complete transfer. Opted with use of bed pan, pt requiring total +2 assist for pericare, after hygiene, pt again requesting need for bedpan, assist RN with placement before leaving room. Pt limited by decreased cog, balance, strength, and activity tolerance. Based on performance today, recommending <3 hours of skilled rehab daily. OT will continue to follow acutely to maximize functional independence.     If plan is discharge home, recommend the following:   Two people to help with walking and/or transfers;Two people to help with bathing/dressing/bathroom;Assistance with cooking/housework;Assist for transportation     Functional Status Assessment   Patient has had a recent decline in their functional status and demonstrates the ability to make significant improvements in function in a reasonable and predictable amount of time.     Equipment Recommendations   Other (comment) (Defer)      Precautions/Restrictions    Precautions Precautions: Fall;Other (comment) Recall of Precautions/Restrictions: Impaired Precaution/Restrictions Comments: bladder/bowel incontinence; chronic wound at thoracic incision (sx 06/2024) Restrictions Weight Bearing Restrictions Per Provider Order: No     Mobility Bed Mobility Overal bed mobility: Needs Assistance Bed Mobility: Rolling, Sit to Supine, Supine to Sit Rolling: Max assist, +2 for physical assistance, +2 for safety/equipment   Supine to sit: Max assist Sit to supine: +2 for physical assistance, +2 for safety/equipment, Total assist   General bed mobility comments: Received pt in process of sitting up with NT, required max assist to manage BLEs and trunk, pt attempting to assist with use of rail. Total to return to bed for safety d/t pt sitting on edge of bed    Transfers Overall transfer level: Needs assistance Equipment used: Rolling walker (2 wheels) Transfers: Sit to/from Stand Sit to Stand: Max assist, +2 physical assistance, +2 safety/equipment           General transfer comment: Pt requesting to try BSC with NT, x2 attempts for STS, unable to maintain standing <5 seconds, pt c/o of pain      Balance Overall balance assessment: Needs assistance Sitting-balance support: Bilateral upper extremity supported, Feet supported Sitting balance-[name]: Poor Sitting balance - Comments: mod assist d/t posterior lean Postural control: Posterior lean Standing balance support: Bilateral upper extremity supported, Reliant on assistive device for balance Standing balance-[name]: Poor Standing balance comment: reliant on RW and external support         ADL either performed or assessed with clinical judgement   ADL Overall ADL's : Needs assistance/impaired Eating/Feeding: Set up;Bed level   Grooming: Set up;Bed level   Upper Body Bathing: Moderate assistance;Bed  level   Lower Body Bathing: Total assistance;Bed level   Upper Body Dressing :  Moderate assistance;Bed level   Lower Body Dressing: Total assistance;Bed level       Toileting- Clothing Manipulation and Hygiene: Total assistance;+2 for physical assistance;+2 for safety/equipment;Bed level         General ADL Comments: pt with poor sitting balance, reporting radiating pain, at this time best for bed level ADLs     Vision Baseline Vision/History: 0 No visual deficits Vision Assessment?: No apparent visual deficits            Pertinent Vitals/Pain Pain Assessment Pain Assessment: Faces Faces Pain Scale: Hurts whole lot Pain Location: back > BUEs/BLEs Pain Descriptors / Indicators: Discomfort, Grimacing, Guarding, Moaning Pain Intervention(s): Limited activity within patient's tolerance     Extremity/Trunk Assessment Upper Extremity Assessment Upper Extremity Assessment: Generalized weakness (edeamous and painful, however using functionally)   Lower Extremity Assessment Lower Extremity Assessment: Defer to PT evaluation   Cervical / Trunk Assessment Cervical / Trunk Assessment: Other exceptions Cervical / Trunk Exceptions: h/o multiple back surgeries (most recently thoracic 06/2024)   Communication Communication Communication: No apparent difficulties   Cognition Arousal: Alert Behavior During Therapy: WFL for tasks assessed/performed Cognition: No family/caregiver present to determine baseline       OT - Cognition Comments: Pt with few verbalizations throughout session, obvious memory and problem solving deficits, however unsure of baseline     Following commands: Impaired Following commands impaired: Follows one step commands inconsistently, Follows one step commands with increased time     Cueing  General Comments   Cueing Techniques: Verbal cues;Gestural cues;Tactile cues  VSS on RA, left pt with RN on bed pan           Home Living Family/patient expects to be discharged to:: Private residence Living Arrangements:  Alone Available Help at Discharge: Family;Friend(s);Available PRN/intermittently Type of Home: House Home Access: Ramped entrance     Home Layout: One level     Bathroom Shower/Tub: Chief Strategy Officer: Standard Bathroom Accessibility: Yes How Accessible: Accessible via walker Home Equipment: Rollator (4 wheels);Wheelchair - manual   Additional Comments: pt potentially unreliable historian      Prior Functioning/Environment Prior Level of Function : Needs assist;Patient poor historian/Family not available       Mobility Comments: pt reports limited ambulator with rollator ADLs Comments: pt initially states indep ADL/iADLs, then states, No I can't do everything by myself... I have a lady that comes in and cleans. per chart, prior to admission 06/2024, pt requiring assist for most ADLs (bed-level bathing, bed-level toileting using bedpan, dressing) since 02/2024    OT Problem List: Decreased strength;Decreased range of motion;Impaired balance (sitting and/or standing);Decreased activity tolerance;Decreased cognition;Decreased safety awareness;Decreased knowledge of use of DME or AE;Decreased knowledge of precautions;Cardiopulmonary status limiting activity   OT Treatment/Interventions: Self-care/ADL training;Therapeutic exercise;Energy conservation;DME and/or AE instruction;Therapeutic activities;Patient/family education;Balance training      OT Goals(Current goals can be found in the care plan section)   Acute Rehab OT Goals Patient Stated Goal: To use the bathroom OT Goal Formulation: With patient Time For Goal Achievement: 11/20/24 Potential to Achieve Goals: Good   OT Frequency:  Min 2X/week       AM-PAC OT 6 Clicks Daily Activity     Outcome Measure Help from another person eating meals?: A Little Help from another person taking care of personal grooming?: A Little Help from another person toileting, which includes using toliet, bedpan, or urinal?:  Total Help from another person bathing (including washing, rinsing, drying)?: Total Help from another person to put on and taking off regular upper body clothing?: A Lot Help from another person to put on and taking off regular lower body clothing?: Total 6 Click Score: 11   End of Session Equipment Utilized During Treatment: Gait belt;Rolling walker (2 wheels) Nurse Communication: Mobility status  Activity Tolerance: Patient limited by pain Patient left: in bed;with call bell/phone within reach;with nursing/sitter in room  OT Visit Diagnosis: Unsteadiness on feet (R26.81);Other abnormalities of gait and mobility (R26.89);Muscle weakness (generalized) (M62.81);Pain                Time: 8948-8872 OT Time Calculation (min): 36 min Charges:  OT General Charges $OT Visit: 1 Visit OT Evaluation $OT Eval Moderate Complexity: 1 Mod OT Treatments $Self Care/Home Management : 8-22 mins  Adrianne BROCKS, OT  Acute Rehabilitation Services Office [phone] Secure chat preferred   Adrianne GORMAN Savers 11/06/2024, 12:25 PM

## 2024-11-07 DIAGNOSIS — E162 Hypoglycemia, unspecified: Secondary | ICD-10-CM | POA: Diagnosis not present

## 2024-11-07 LAB — CK: Total CK: 598 U/L — ABNORMAL HIGH (ref 38–234)

## 2024-11-07 LAB — GLUCOSE, CAPILLARY
Glucose-Capillary: 68 mg/dL — ABNORMAL LOW (ref 70–99)
Glucose-Capillary: 84 mg/dL (ref 70–99)
Glucose-Capillary: 80 mg/dL (ref 70–99)
Glucose-Capillary: 95 mg/dL (ref 70–99)
Glucose-Capillary: 81 mg/dL (ref 70–99)

## 2024-11-07 MED ORDER — FUROSEMIDE 10 MG/ML IJ SOLN
40.0000 mg | Freq: Every day | INTRAMUSCULAR | Status: AC
  Administered 2024-11-07 – 2024-11-08 (×2): 40 mg via INTRAVENOUS
  Filled 2024-11-07 (×2): qty 4

## 2024-11-07 MED ORDER — BUTALBITAL-APAP-CAFFEINE 50-325-40 MG PO TABS
1.0000 | ORAL_TABLET | Freq: Four times a day (QID) | ORAL | Status: AC | PRN
  Administered 2024-11-07: 1 via ORAL
  Filled 2024-11-07: qty 1

## 2024-11-07 MED ORDER — HYDROCODONE-ACETAMINOPHEN 5-325 MG PO TABS
1.0000 | ORAL_TABLET | Freq: Four times a day (QID) | ORAL | Status: DC | PRN
  Administered 2024-11-07 – 2024-11-10 (×9): 2 via ORAL
  Filled 2024-11-07 (×9): qty 2

## 2024-11-07 MED ORDER — OXYCODONE-ACETAMINOPHEN 10-325 MG PO TABS: 1.0000 | ORAL_TABLET | Freq: Three times a day (TID) | ORAL | Status: DC | PRN

## 2024-11-07 NOTE — TOC Progression Note (Signed)
 Transition of Care [facility]) - Progression Note    Patient Details  Name: [name] MRN: [id number] Date of Birth: September 30, 1961  [facility] CM/SW Contact  Olam FORBES Ally, LCSW Phone Number: 11/07/2024, 12:42 PM  Clinical Narrative:     CSW attempted reach patient via phone and was unable to reach her.  ICM team will continue to assist with discharge planning needs.   Expected Discharge Plan: Skilled Nursing Facility Barriers to Discharge: Continued Medical Work up, SNF Pending bed offer, English As A Second Language Teacher               Expected Discharge Plan and Services In-house Referral: Clinical Social Work   Post Acute Care Choice: Skilled Nursing Facility Living arrangements for the past 2 months: Single Family Home                                       Social Drivers of Health (SDOH) Interventions SDOH Screenings   Food Insecurity: No Food Insecurity (11/05/2024)  Housing: Low Risk (11/05/2024)  Transportation Needs: No Transportation Needs (11/05/2024)  Utilities: Not At Risk (11/05/2024)  Depression (PHQ2-9): Low Risk (01/12/2024)  Tobacco Use: Medium Risk (11/04/2024)    Readmission Risk Interventions    05/17/2022    3:02 PM  Readmission Risk Prevention Plan  Transportation Screening Complete  PCP or Specialist Appt within 5-7 Days Complete  Home Care Screening Complete  Medication Review (RN CM) Complete

## 2024-11-08 DIAGNOSIS — E162 Hypoglycemia, unspecified: Secondary | ICD-10-CM | POA: Diagnosis not present

## 2024-11-08 LAB — CK: Total CK: 267 U/L — ABNORMAL HIGH (ref 38–234)

## 2024-11-08 LAB — GLUCOSE, CAPILLARY
Glucose-Capillary: 148 mg/dL — ABNORMAL HIGH (ref 70–99)
Glucose-Capillary: 129 mg/dL — ABNORMAL HIGH (ref 70–99)
Glucose-Capillary: 103 mg/dL — ABNORMAL HIGH (ref 70–99)
Glucose-Capillary: 98 mg/dL (ref 70–99)

## 2024-11-08 LAB — MAGNESIUM: Magnesium: 1.7 mg/dL (ref 1.7–2.4)

## 2024-11-08 LAB — VITAMIN B1: Vitamin B1 (Thiamine): 265.5 nmol/L — ABNORMAL HIGH (ref 66.5–200.0)

## 2024-11-08 LAB — POTASSIUM: Potassium: 3.4 mmol/L — ABNORMAL LOW (ref 3.5–5.1)

## 2024-11-08 MED ORDER — ENSURE PLUS HIGH PROTEIN PO LIQD: 237.0000 mL | Freq: Two times a day (BID) | ORAL | Status: DC

## 2024-11-08 MED ORDER — POTASSIUM CHLORIDE CRYS ER 20 MEQ PO TBCR
40.0000 meq | EXTENDED_RELEASE_TABLET | Freq: Two times a day (BID) | ORAL | Status: AC
  Administered 2024-11-08: 40 meq via ORAL
  Filled 2024-11-08: qty 2

## 2024-11-08 MED ORDER — BOOST PLUS PO LIQD
237.0000 mL | Freq: Two times a day (BID) | ORAL | Status: DC
  Administered 2024-11-08 – 2024-11-10 (×4): 237 mL via ORAL
  Filled 2024-11-08 (×5): qty 237

## 2024-11-08 MED ORDER — ALUM & MAG HYDROXIDE-SIMETH 200-200-20 MG/5ML PO SUSP
15.0000 mL | Freq: Once | ORAL | Status: AC
  Administered 2024-11-08: 15 mL via ORAL
  Filled 2024-11-08: qty 30

## 2024-11-08 MED ORDER — THIAMINE MONONITRATE 100 MG PO TABS
100.0000 mg | ORAL_TABLET | Freq: Every day | ORAL | Status: DC
  Administered 2024-11-09 – 2024-11-10 (×2): 100 mg via ORAL
  Filled 2024-11-08 (×2): qty 1

## 2024-11-08 MED ORDER — ADULT MULTIVITAMIN W/MINERALS CH
1.0000 | ORAL_TABLET | Freq: Every day | ORAL | Status: DC
  Administered 2024-11-08 – 2024-11-10 (×3): 1 via ORAL
  Filled 2024-11-08 (×3): qty 1

## 2024-11-08 MED ORDER — CYCLOBENZAPRINE HCL 10 MG PO TABS
10.0000 mg | ORAL_TABLET | Freq: Three times a day (TID) | ORAL | Status: DC
  Administered 2024-11-08 – 2024-11-10 (×6): 10 mg via ORAL
  Filled 2024-11-08 (×7): qty 1

## 2024-11-08 NOTE — Plan of Care (Signed)

## 2024-11-08 NOTE — Plan of Care (Signed)
  Problem: Education: Goal: Knowledge of General Education information will improve Description: Including pain rating scale, medication(s)/side effects and non-pharmacologic comfort measures Outcome: Progressing   Problem: Clinical Measurements: Goal: Diagnostic test results will improve Outcome: Progressing   Problem: Activity: Goal: Risk for activity intolerance will decrease Outcome: Progressing   Problem: Pain Managment: Goal: General experience of comfort will improve and/or be controlled Outcome: Progressing   Problem: Safety: Goal: Ability to remain free from injury will improve Outcome: Progressing

## 2024-11-09 LAB — GLUCOSE, CAPILLARY
Glucose-Capillary: 85 mg/dL (ref 70–99)
Glucose-Capillary: 93 mg/dL (ref 70–99)
Glucose-Capillary: 137 mg/dL — ABNORMAL HIGH (ref 70–99)
Glucose-Capillary: 93 mg/dL (ref 70–99)

## 2024-11-09 LAB — BASIC METABOLIC PANEL WITH GFR
Anion gap: 10 (ref 5–15)
BUN: 12 mg/dL (ref 8–23)
CO2: 28 mmol/L (ref 22–32)
Calcium: 9.1 mg/dL (ref 8.9–10.3)
Chloride: 107 mmol/L (ref 98–111)
Creatinine, Ser: 0.55 mg/dL (ref 0.44–1.00)
GFR, Estimated: >60 mL/min
Glucose, Bld: 93 mg/dL (ref 70–99)
Potassium: 3.6 mmol/L (ref 3.5–5.1)
Sodium: 145 mmol/L (ref 135–145)

## 2024-11-09 MED ORDER — FUROSEMIDE 10 MG/ML IJ SOLN
40.0000 mg | Freq: Every day | INTRAMUSCULAR | Status: AC
  Administered 2024-11-09 – 2024-11-10 (×2): 40 mg via INTRAVENOUS
  Filled 2024-11-09 (×2): qty 4

## 2024-11-09 MED ORDER — DICLOFENAC SODIUM 1 % EX GEL
2.0000 g | Freq: Four times a day (QID) | CUTANEOUS | Status: DC | PRN
  Administered 2024-11-10: 2 g via TOPICAL
  Filled 2024-11-09: qty 100

## 2024-11-09 MED ORDER — POLYETHYLENE GLYCOL 3350 17 G PO PACK
17.0000 g | PACK | Freq: Every day | ORAL | Status: DC
  Administered 2024-11-10: 17 g via ORAL
  Filled 2024-11-09 (×2): qty 1

## 2024-11-09 MED ORDER — GABAPENTIN 300 MG PO CAPS
600.0000 mg | ORAL_CAPSULE | Freq: Two times a day (BID) | ORAL | Status: DC
  Administered 2024-11-09 – 2024-11-10 (×3): 600 mg via ORAL
  Filled 2024-11-09 (×2): qty 2
  Filled 2024-11-09: qty 6

## 2024-11-09 MED ORDER — CELECOXIB 200 MG PO CAPS
200.0000 mg | ORAL_CAPSULE | Freq: Two times a day (BID) | ORAL | Status: DC
  Administered 2024-11-09 – 2024-11-10 (×3): 200 mg via ORAL
  Filled 2024-11-09 (×4): qty 1

## 2024-11-09 MED ORDER — POLYVINYL ALCOHOL 1.4 % OP SOLN
1.0000 [drp] | OPHTHALMIC | Status: DC | PRN
  Administered 2024-11-10: 1 [drp] via OPHTHALMIC
  Filled 2024-11-09: qty 15

## 2024-11-10 LAB — CULTURE, BLOOD (ROUTINE X 2)
Culture: NO GROWTH
Culture: NO GROWTH
Special Requests: ADEQUATE

## 2024-11-10 LAB — BASIC METABOLIC PANEL WITH GFR
Anion gap: 10 (ref 5–15)
BUN: 17 mg/dL (ref 8–23)
CO2: 27 mmol/L (ref 22–32)
Calcium: 9.1 mg/dL (ref 8.9–10.3)
Chloride: 103 mmol/L (ref 98–111)
Creatinine, Ser: 0.58 mg/dL (ref 0.44–1.00)
GFR, Estimated: >60 mL/min
Glucose, Bld: 142 mg/dL — ABNORMAL HIGH (ref 70–99)
Potassium: 3.5 mmol/L (ref 3.5–5.1)
Sodium: 140 mmol/L (ref 135–145)

## 2024-11-10 LAB — GLUCOSE, CAPILLARY
Glucose-Capillary: 82 mg/dL (ref 70–99)
Glucose-Capillary: 77 mg/dL (ref 70–99)
Glucose-Capillary: 114 mg/dL — ABNORMAL HIGH (ref 70–99)

## 2024-11-10 MED ORDER — GABAPENTIN 300 MG PO CAPS: 600.0000 mg | ORAL_CAPSULE | Freq: Two times a day (BID) | ORAL | 0 refills | Status: AC

## 2024-11-10 MED ORDER — ACETAMINOPHEN 325 MG PO TABS: 650.0000 mg | ORAL_TABLET | Freq: Four times a day (QID) | ORAL | 0 refills | Status: AC | PRN

## 2024-11-10 MED ORDER — HYDROCODONE-ACETAMINOPHEN 5-325 MG PO TABS: ORAL_TABLET | ORAL | 0 refills | Status: AC

## 2024-11-10 MED ORDER — VITAMIN B-1 100 MG PO TABS: 100.0000 mg | ORAL_TABLET | Freq: Every day | ORAL | 0 refills | Status: AC

## 2024-11-10 MED ORDER — CYCLOBENZAPRINE HCL 10 MG PO TABS: 10.0000 mg | ORAL_TABLET | Freq: Three times a day (TID) | ORAL | 0 refills | Status: AC

## 2024-11-10 MED ORDER — DIAZEPAM 2 MG PO TABS: 2.0000 mg | ORAL_TABLET | Freq: Two times a day (BID) | ORAL | 0 refills | Status: AC | PRN

## 2024-11-10 MED ORDER — DIPHENHYDRAMINE HCL 25 MG PO CAPS
25.0000 mg | ORAL_CAPSULE | Freq: Once | ORAL | Status: AC | PRN
  Administered 2024-11-10: 25 mg via ORAL
  Filled 2024-11-10: qty 1

## 2024-11-10 NOTE — TOC Transition Note (Signed)
 Transition of Care [facility]) - Discharge Note   Patient Details  Name: [name] MRN: [id number] Date of Birth: Jun 21, 1961  Transition of Care [facility]) CM/SW Contact:  Luise JAYSON Pan, LCSWA Phone Number: 11/10/2024, 12:49 PM   Clinical Narrative:   Patient will DC to: [facility] Anticipated DC date: 11/10/24  Family notified: Orval (son) Transport by: [place]   Per MD patient ready for DC to [facility]. RN to call report prior to discharge [phone] room 104). RN, patient, patient's family, and facility notified of DC. Discharge Summary and FL2 sent to facility. DC packet on chart. Ambulance transport requested for patient 12:22PM.   CSW will sign off for now as social work intervention is no longer needed. Please consult us  again if new needs arise.      Final next level of care: Skilled Nursing Facility Barriers to Discharge: Barriers Resolved   Patient Goals and CMS Choice Patient states their goals for this hospitalization and ongoing recovery are:: Unsure at this time CMS Medicare.gov Compare Post Acute Care list provided to:: Patient Choice offered to / list presented to : Patient [place] ownership interest in [facility].provided to:: Patient    Discharge Placement              Patient chooses bed at: [facility] Patient to be transferred to facility by: [facility] Name of family member notified: Jacarra, Bobak (Son, Emergency Contact) [phone] Patient and family notified of of transfer: 11/10/24  Discharge Plan and Services Additional resources added to the After Visit Summary for   In-house Referral: Clinical Social Work   Post Acute Care Choice: Skilled Nursing Facility                               Social Drivers of Health (SDOH) Interventions SDOH Screenings   Food Insecurity: No Food Insecurity (11/05/2024)  Housing: Low Risk (11/05/2024)  Transportation Needs: No Transportation Needs (11/05/2024)   Utilities: Not At Risk (11/05/2024)  Depression (PHQ2-9): Low Risk (01/12/2024)  Tobacco Use: Medium Risk (11/04/2024)     Readmission Risk Interventions    05/17/2022    3:02 PM  Readmission Risk Prevention Plan  Transportation Screening Complete  PCP or Specialist Appt within 5-7 Days Complete  Home Care Screening Complete  Medication Review (RN CM) Complete

## 2024-11-10 NOTE — TOC Progression Note (Addendum)
 Transition of Care [facility]) - Progression Note    Patient Details  Name: [name] MRN: [id number] Date of Birth: 04/16/1961  [facility] CM/SW Contact  Luise JAYSON Pan, [place] Phone Number: 11/10/2024, 8:19 AM  Clinical Narrative:   Patients [name] has been approved 11/10/2024-11/12/2024. CSW to follow up with facility about bed availability. CSW to follow up with MD about sons request for an MRI.   10:20AM Facility informed CSW they have a bed for patient today.   2:24 PM Per patient, she has been waiting for her nephew to bring her clothes but stated her nephew cannot come until after 3PM. CSW followed up with [employer] CSW updated [facility] and facility with new pickup time of 5:30pm - 6:30 pm. CSW updated patient.  CSW will continue to follow.    Expected Discharge Plan: Skilled Nursing Facility Barriers to Discharge: Continued Medical Work up, SNF Pending bed offer, English [employer] Language [employer]               Expected Discharge Plan and Services In-house Referral: Clinical Social Work   Post Acute Care Choice: Skilled Nursing Facility Living arrangements for the past 2 months: Single Family Home                                       Social Drivers of Health (SDOH) Interventions SDOH Screenings   Food Insecurity: No Food Insecurity (11/05/2024)  Housing: Low Risk (11/05/2024)  Transportation Needs: No Transportation Needs (11/05/2024)  Utilities: Not At Risk (11/05/2024)  Depression (PHQ2-9): Low Risk (01/12/2024)  Tobacco Use: Medium Risk (11/04/2024)    Readmission Risk Interventions    05/17/2022    3:02 PM  Readmission Risk Prevention Plan  Transportation Screening Complete  PCP or Specialist Appt within 5-7 Days Complete  Home Care Screening Complete  Medication Review (RN CM) Complete

## 2024-11-10 NOTE — Progress Notes (Signed)
" °   11/10/24 0343  Provider Notification  Provider Name/Title Dr. Charlton  Date Provider Notified 11/10/24  Time Provider Notified [phone]  Method of Notification Page  Notification Reason Pt requested Benadryl  for her allergy. Pt stated she usually takes 50 mg 2-3 time/day.  Provider response See new orders for one time dose 25 mg of Benadryl . Please inform the Pt to discuss with the attending MD.  Date of Provider Response 11/10/24  Time of Provider Response 0344   Wendi Dash, RN  "

## 2024-11-10 NOTE — Progress Notes (Signed)
 Physical Therapy Treatment Patient Details Name: [name] MRN: [id number] DOB: 26-Jun-1961 Today's Date: 11/10/2024   History of Present Illness 64 y.o. female presenting 11/04/24 after unwitnessed fall, reportedly found down >24 hr later by son, denies hitting head; found to be hypothermic, hypoglycemic, has small back wound (pt reports h/o open area around thoracic incision that drains, neurosx following).  Of note, s/p T9-10 laminectomy 06/2024. Other PMH includes multiple back/cervical sxs, CSF leak, HTN, fibromyalgia, anxiety, IBS.    PT Comments  Pt received in supine and agreeable to PT session. Pt continues to require up to MaxAx2 for bed mobility and ModAx2 to MaxAx2 for multiple sit<>stand transfers. Pt progressed by being able to take side-steps towards [facility] with MaxAx2 and RW. Assist needed for RW management and to maintain upright posture. Pt would weight-shift to offload LE prior to taking a step. Assisted with return to supine as pt had a rep from [facility] to discuss d/c. Continue to recommend <3hrs post acute rehab with acute PT to follow.    If plan is discharge home, recommend the following: Two people to help with walking and/or transfers;Two people to help with bathing/dressing/bathroom;Assistance with cooking/housework;Direct supervision/assist for medications management;Direct supervision/assist for financial management;Assist for transportation;Help with stairs or ramp for entrance   Can travel by private vehicle     No  Equipment Recommendations  Other (comment) (TBD)       Precautions / Restrictions Precautions Precautions: Fall;Other (comment) Recall of Precautions/Restrictions: Impaired Precaution/Restrictions Comments: bladder/bowel incontinence; chronic wound at thoracic incision (sx 06/2024) Restrictions Weight Bearing Restrictions Per Provider Order: No     Mobility  Bed Mobility Overal bed mobility: Needs Assistance Bed Mobility: Rolling, Sidelying to  Sit, Sit to Sidelying Rolling: Used rails, Min assist Sidelying to sit: Max assist, +2 for physical assistance, +2 for safety/equipment, Used rails    Sit to sidelying: Mod assist, Used rails General bed mobility comments: able to roll bilaterally with MinA for assist. MaxAx2 to move from sidelying to sit. ModA for return to sidelying for BLE mangement    Transfers Overall transfer level: Needs assistance Equipment used: Rolling walker (2 wheels) Transfers: Sit to/from Stand Sit to Stand: Mod assist, Max assist, +2 physical assistance, +2 safety/equipment, From elevated surface    General transfer comment: x3 sit<>stand from slightly elevated surface with ModAx2 to MaxAx2. Increased time to move into upright posture    Ambulation/Gait Ambulation/Gait assistance: Max assist, +2 physical assistance, +2 safety/equipment Gait Distance (Feet): 4 Feet (2x2) Assistive device: Rolling walker (2 wheels) Gait Pattern/deviations: Step-to pattern, Decreased stride length, Trunk flexed Gait velocity: decr     General Gait Details: forward flexed trunk with heavy reliance on UE support. Assist needed to move RW and to support balance. Able to weight-shift to off-load prior to taking side-steps      Balance Overall balance assessment: Needs assistance Sitting-balance support: No upper extremity supported, Feet supported Sitting balance-[name]: Fair   Postural control: Posterior lean Standing balance support: Bilateral upper extremity supported, Reliant on assistive device for balance Standing balance-[name]: Zero Standing balance comment: reliant on MaxAx2 and BUE support       Communication Communication Communication: No apparent difficulties  Cognition Arousal: Alert Behavior During Therapy: WFL for tasks assessed/performed   PT - Cognitive impairments: No family/caregiver present to determine baseline, Sequencing, Problem solving, Safety/Judgement    Following commands:  Impaired Following commands impaired: Follows one step commands with increased time    Cueing Cueing Techniques: Verbal cues,  Gestural cues     General Comments General comments (skin integrity, edema, etc.): VSS on RA      Pertinent Vitals/Pain Pain Assessment Pain Assessment: Faces Faces Pain Scale: Hurts little more Pain Location: back, BLEs Pain Descriptors / Indicators: Discomfort, Cramping Pain Intervention(s): Limited activity within patient's tolerance, Monitored during session, Repositioned     PT Goals (current goals can now be found in the care plan section) Acute Rehab PT Goals Patient Stated Goal: decreased pain PT Goal Formulation: With patient Time For Goal Achievement: 11/19/24 Potential to Achieve Goals: Fair Progress towards PT goals: Progressing toward goals    Frequency    Min 2X/week       AM-PAC PT 6 Clicks Mobility   Outcome Measure  Help needed turning from your back to your side while in a flat bed without using bedrails?: A Little Help needed moving from lying on your back to sitting on the side of a flat bed without using bedrails?: Total Help needed moving to and from a bed to a chair (including a wheelchair)?: Total Help needed standing up from a chair using your arms (e.g., wheelchair or bedside chair)?: Total Help needed to walk in hospital room?: Total Help needed climbing 3-5 steps with a railing? : Total 6 Click Score: 8    End of Session Equipment Utilized During Treatment: Gait belt Activity Tolerance: Patient tolerated treatment well Patient left: in bed;with call bell/phone within reach;Other (comment) (rep from [employer]) Nurse Communication: Mobility status PT Visit Diagnosis: Other abnormalities of gait and mobility (R26.89);Muscle weakness (generalized) (M62.81);History of falling (Z91.81)     Time: 8964-8943 PT Time Calculation (min) (ACUTE ONLY): 21 min  Charges:    $Therapeutic Activity: 8-22 mins PT General  Charges $$ ACUTE PT VISIT: 1 Visit                    Kate ORN, PT, DPT Secure Chat Preferred  Rehab Office [phone]   Kate BRAVO Wendolyn 11/10/2024, 1:05 PM

## 2024-11-10 NOTE — Discharge Summary (Signed)
 Physician Discharge Summary  MACI EICKHOLT FMW:[id number] DOB: 1961/07/27 DOA: 11/04/2024  PCP: Leonel Cole, MD  Admit date: 11/04/2024 Discharge date: 11/10/2024  Admitted From: Home Disposition:  SNF  Recommendations for Outpatient Follow-up:  Follow up with PCP in 1-2 weeks Follow-up with neurosurgery as scheduled  Discharge Condition: Stable CODE STATUS: Full Diet recommendation: Low-salt low-fat diet as tolerated  Brief/Interim Summary: [name] is a 64 y.o. female with medical history significant of fibromyalgia, thoracic stenosis sp thoracic decompression September 2025, GERD, HTN, Hyperthyroidism, IBS, CSF leak presented after being found down by sister who lives about 10 minutes away from patient. Patient never passed out or lost consciousness. Patient had been down for about over 24 hours. In the ED, patient was noted to be hypothermic and hypoglycemic. Does have a small postop wound at her back which has been intermittently draining and has been followed by neurosurgery. Patient with bilateral lower extremity edema, and some upper extremity as well. Patient lives alone, and ambulates with a cane/walker. While she had fallen, patient had no access to food or water and denies hitting her head. Vital signs showed hypothermia, tachycardic, BP stable and saturating well on room air. Labs showed glucose 62, CK 2259, D-dimer 0.52, EKG with sinus tachycardia, UA unremarkable for infection, chest x-ray unremarkable for any infection. Patient admitted for further management.  Patient admitted as above after unwitnessed fall and altered mental status in the setting of poor p.o. intake rhabdomyolysis hypoglycemia hypothermia with notably elevated D-dimer(although negative workup for blood clot).  Patient continues to be followed by outpatient neurosurgery, continue doxycycline  per their recommendations.  Patient otherwise appears to have improved drastically with IV fluids and supportive  care.  Currently being evaluated for discharge to skilled nursing facility given ongoing weakness and recommendations per PT OT.  At this time she is medically stable for discharge and otherwise agreeable for ongoing outpatient care per PCP and neurosurgery.  Medication changes as outlined below.  Discharge Diagnoses:  Principal Problem:   Hypoglycemia Active Problems:   Rhabdomyolysis   Polypharmacy   Fibromyalgia   Hypertension   Anemia   Hyperthyroidism   Delayed surgical wound healing   Falls frequently   Obesity, Class III, BMI 40-49.9 (morbid obesity) (HCC)   Hypothermia  Unwitnessed fall Altered mental status- resolved Ambulatory dysfunction, acute Patient reported mechanical, slipped and fell out of bed, unable to get up for greater than 24 hours Imaging without overt fracture or acute findings PT/OT-recommend SNF -plan for discharge today   Rhabdomyolysis, resolved CK 2,259-->1,645--> 1080--> 598-->267 Discontinue IV fluids, tolerating p.o. well at this point   Hypoglycemia, resolved Likely in the setting of poor oral intake -resolved with advancement of diet   Hypothermia, resolved Secondary to prolonged downtime and poor heating in the house, resolved with supportive care   Delayed surgical wound healing following thoracic decompression in September 2025 Followed by neurosurgery patient Cultures remain negative to date Wound care consulted Continue doxycycline  per prior recommendations   Elevated D-dimer, PE/DVT ruled out Likely acute phase reactant, minimally elevated at 0.52   Chronic diastolic HF Vs lymphedema, not in acute exacerbation Lower extremity edema improving, Prior echo 07/2024 EF 70-75% without wall motion abnormality and grade 1 diastolic dysfunction.  Moderately elevated PASP   Hypokalemia, resolved  Normocytic anemia -stable at baseline Hypertension BP stable on atenolol , discontinue HCTZ Hyperthyroidism TSH within normal limits, no longer  on medication   Fibromyalgia Chronic pain regimen noted - Wean medications as below, discontinue oxycodone ,  methocarbamol , chlorzoxazone  -Transition to hydrocodone , 21-day taper outlined below - Would recommend limiting CNS depressants in the future given patient's admission as above   Morbid obesity Lifestyle modification advised Body mass index is 44.72 kg/m.  Discharge Instructions  Discharge Instructions     Call MD for:  difficulty breathing, headache or visual disturbances   Complete by: As directed    Call MD for:  extreme fatigue   Complete by: As directed    Call MD for:  hives   Complete by: As directed    Call MD for:  persistant dizziness or light-headedness   Complete by: As directed    Call MD for:  persistant nausea and vomiting   Complete by: As directed    Call MD for:  severe uncontrolled pain   Complete by: As directed    Call MD for:  temperature >100.4   Complete by: As directed    Diet general   Complete by: As directed    Advance diet as tolerated - no restrictions at this time   Discharge wound care:   Complete by: As directed    Daily: Cleanse back wound with NS, using a Q tip applicator apply a small piece of silver hydrofiber ([id number]) to open wound bed daily and secure with silicone foam or ABD pad and clothe tape whichever is preferred   Increase activity slowly   Complete by: As directed       Allergies as of 11/10/2024       Reactions   Monosodium Glutamate Anaphylaxis, Swelling   Eyes swollen shut, facial swelling, tongue swelling.   Shellfish Allergy Anaphylaxis   Melatonin Other (See Comments)   Muscle spasms involuntary movement   Baclofen Nausea Only   Dizziness and increase muscle spasm   Celebrex  [celecoxib ] Itching   Only allergic to generic brand   Ciprofloxacin  Other (See Comments)   inability to walk/Unable to walk   Contrast Media [iodinated Contrast Media] Itching, Nausea Only   could not walk   Diclofenac  Itching    Generic Diclefenac gel causes itching. Can take the name brand Voltaren  gel   Gadolinium    Allergic to MRI contrast dye per patient.   Molds & Smuts Other (See Comments)   Causes allergies to flair up/sinus issues/headaches   Other Other (See Comments)   Pet dander,    Promethazine  Itching   Statins Other (See Comments)   pain   Sulfa  Antibiotics Other (See Comments)   Rash, migraine    Latex Itching, Rash   Latex glove with powder; bite blocks used for EGD studies        Medication List     STOP taking these medications    chlorzoxazone  500 MG tablet Commonly known as: PARAFON    gabapentin  600 MG tablet Commonly known as: NEURONTIN  Replaced by: gabapentin  300 MG capsule   hydrochlorothiazide  25 MG tablet Commonly known as: HYDRODIURIL    methocarbamol  500 MG tablet Commonly known as: ROBAXIN    oxyCODONE -acetaminophen  10-325 MG tablet Commonly known as: PERCOCET       TAKE these medications    acetaminophen  325 MG tablet Commonly known as: TYLENOL  Take 2 tablets (650 mg total) by mouth every 6 (six) hours as needed for mild pain (pain score 1-3) (or Fever >/= 101). What changed:  medication strength how much to take reasons to take this   albuterol  108 (90 Base) MCG/ACT inhaler Commonly known as: VENTOLIN  HFA Inhale 2 puffs into the lungs every 6 (six) hours as  needed for wheezing or shortness of breath.   ASCORBIC ACID  PO Take 2 tablets by mouth in the morning. Gummies   aspirin  EC 81 MG tablet Take 81 mg by mouth in the morning.   atenolol  50 MG tablet Commonly known as: TENORMIN  Take 1 tablet (50 mg total) by mouth daily.   CALCIUM -MAGNESIUM -ZINC -D3 PO Take 1 tablet by mouth in the morning.   CeleBREX  200 MG capsule Generic drug: celecoxib  Take 200 mg by mouth 2 (two) times daily.   cyclobenzaprine  10 MG tablet Commonly known as: FLEXERIL  Take 1 tablet (10 mg total) by mouth 3 (three) times daily.   diazepam  2 MG tablet Commonly  known as: VALIUM  Take 1 tablet (2 mg total) by mouth 2 (two) times daily as needed for anxiety. What changed:  medication strength how much to take when to take this reasons to take this   diphenhydrAMINE  25 MG tablet Commonly known as: BENADRYL  Take 50 mg by mouth in the morning, at noon, in the evening, and at bedtime.   doxycycline  100 MG tablet Commonly known as: VIBRA -TABS Take 100 mg by mouth 2 (two) times daily.   gabapentin  300 MG capsule Commonly known as: NEURONTIN  Take 2 capsules (600 mg total) by mouth 2 (two) times daily. Replaces: gabapentin  600 MG tablet   hydrocerin Crea Apply 1 Application topically 2 (two) times daily. What changed:  when to take this reasons to take this   HYDROcodone -acetaminophen  5-325 MG tablet Commonly known as: NORCO/VICODIN Take 1-2 tablets by mouth every 6 (six) hours as needed for 7 days for severe pain (pain score 7-10) or moderate pain (pain score 4-6), THEN 1 tablet every 6 (six) hours as needed for 7 days for severe pain (pain score 7-10) or moderate pain (pain score 4-6), THEN 0.5 tablets every 6 (six) hours as needed for up to 7 days for severe pain (pain score 7-10) or moderate pain (pain score 4-6). Start taking on: November 10, 2024   lubiprostone  24 MCG capsule Commonly known as: Amitiza  Take 1 capsule (24 mcg total) by mouth 2 (two) times daily with a meal. What changed:  how much to take when to take this   multivitamin with minerals Tabs tablet Take 1 tablet by mouth in the morning. Women's Multivitamin   naloxone  4 MG/0.1ML Liqd nasal spray kit Commonly known as: Narcan  Place 1 spray into the nose once as needed (opioid overdose).   omeprazole 20 MG capsule Commonly known as: PRILOSEC Take 20 mg by mouth in the morning and at bedtime.   polyethylene glycol powder 17 GM/SCOOP powder Commonly known as: GLYCOLAX /MIRALAX  Take 17 g by mouth daily.   thiamine  100 MG tablet Commonly known as: Vitamin B-1 Take 1  tablet (100 mg total) by mouth daily.   VITAMIN B-12 PO Take 1 tablet by mouth in the morning.   VITAMIN D -3 PO Take 1 tablet by mouth in the morning.   Voltaren  1 % Gel Generic drug: diclofenac  Sodium Apply 1 Application topically 4 (four) times daily as needed (pain.).               Discharge Care Instructions  (From admission, onward)           Start     Ordered   11/10/24 0000  Discharge wound care:       Comments: Daily: Cleanse back wound with NS, using a Q tip applicator apply a small piece of silver hydrofiber (TI#[id number]) to open wound bed daily and secure  with silicone foam or ABD pad and clothe tape whichever is preferred   11/10/24 1046            Contact information for after-discharge care     Destination     [facility], [place] .   Service: Skilled Nursing Contact information: [address] [place]  [address] [phone]                    Allergies[1]  Consultations: None   Procedures/Studies: CT HEAD WO CONTRAST ( ) Result Date: 11/05/2024 CLINICAL DATA:  Initial evaluation for acute mental status change, fall. EXAM: CT HEAD WITHOUT CONTRAST TECHNIQUE: Contiguous axial images were obtained from the base of the skull through the vertex without intravenous contrast. RADIATION DOSE REDUCTION: This exam was performed according to the departmental dose-optimization program which includes automated exposure control, adjustment of the mA and/or kV according to patient size and/or use of iterative reconstruction technique. COMPARISON:  Prior study from 03/17/2022. FINDINGS: Brain: Cerebral volume within normal limits for patient age. No acute intracranial hemorrhage. No acute large vessel territory infarct. No mass lesion, midline shift, or mass effect. Ventricles are normal in size without hydrocephalus. No extra-axial fluid collection. Vascular: No abnormal hyperdense vessel. Skull: Scalp soft tissues demonstrate no  acute abnormality. Calvarium intact. Sinuses/Orbits: Globes and orbital soft tissues within normal limits. Visualized paranasal sinuses are largely clear. No significant mastoid effusion. IMPRESSION: Normal head CT. No acute intracranial abnormality. Electronically Signed   By: Morene Hoard M.D.   On: 11/05/2024 23:02   CT LUMBAR SPINE WO CONTRAST Result Date: 11/05/2024 EXAM: CT OF THE LUMBAR SPINE WITHOUT CONTRAST 11/05/2024 09:20:38 PM TECHNIQUE: CT of the lumbar spine was performed without the administration of intravenous contrast. Multiplanar reformatted images are provided for review. Automated exposure control, iterative reconstruction, and/or weight based adjustment of the mA/kV was utilized to reduce the radiation dose to as low as reasonably achievable. COMPARISON: Radiographs S1 / 29 / 26 and MRI 8 / 20 / 25. CLINICAL HISTORY: fall Fall. FINDINGS: BONES AND ALIGNMENT: Posterior thoracolumbar fusion from T10 - L5. Anterior fusion L5 - S1. Normal vertebral body heights. No evidence of acute fracture or traumatic malalignment. Lucencies around the bilateral L3 screws and the right L5 screws compatible with loosening. DEGENERATIVE CHANGES: Evaluation of the spinal canal and neural foramina is limited due to streak artifact. SOFT TISSUES: No acute abnormality. IMPRESSION: 1. No evidence of acute fracture or traumatic malalignment. Electronically signed by: Norman Gatlin MD 11/05/2024 10:45 PM EST RP Workstation: [id number]   CT THORACIC SPINE WO CONTRAST Result Date: 11/05/2024 EXAM: CT THORACIC SPINE WITHOUT CONTRAST 11/05/2024 09:20:38 PM TECHNIQUE: CT of the thoracic spine was performed without the administration of intravenous contrast. Multiplanar reformatted images are provided for review. Automated exposure control, iterative reconstruction, and/or weight based adjustment of the mA/kV was utilized to reduce the radiation dose to as low as reasonably achievable. COMPARISON: Comparison  with x-ray 11/04/2024. Comparison with MRI 10/27/2023. CLINICAL HISTORY: fall Fall. FINDINGS: BONES AND ALIGNMENT: Normal vertebral body heights. No acute fracture or suspicious bone lesion. Normal alignment. Thoracolumbar posterior fusion starting at T10. DEGENERATIVE CHANGES: Multilevel spondylosis, disc space height loss, and degenerative endplate changes which is advanced at C7 - T1, T3 - T4, T8 - T9, T9 - T10, T10 - T11. Advanced facet arthropathy at T9 - T10. Ongoing severe spinal canal narrowing at T9 - T10 better appreciated on prior MRI. SOFT TISSUES: No acute abnormality. Findings in the chest reported  separately on same day CT chest. IMPRESSION: 1. No acute abnormality of the thoracic spine related to the fall. 2. Multilevel spondylosis similar to prior MRI. Ongoing severe spinal canal narrowing at T9 - T10. Electronically signed by: Norman Gatlin MD 11/05/2024 10:40 PM EST RP Workstation: [id number]   CT CERVICAL SPINE WO CONTRAST Result Date: 11/05/2024 EXAM: CT CERVICAL SPINE WITHOUT CONTRAST 11/05/2024 09:20:38 PM TECHNIQUE: CT of the cervical spine was performed without the administration of intravenous contrast. Multiplanar reformatted images are provided for review. Automated exposure control, iterative reconstruction, and/or weight based adjustment of the mA/kV was utilized to reduce the radiation dose to as low as reasonably achievable. COMPARISON: Comparison with MRI 10/28/2023. CLINICAL HISTORY: fall Fall. FINDINGS: BONES AND ALIGNMENT: Anterior fusion C3 - C5 and C6 - C7. Ankylosis of the C3 - C5 facets bilaterally. No acute fracture or traumatic malalignment. DEGENERATIVE CHANGES: No severe spinal canal narrowing. SOFT TISSUES: No prevertebral soft tissue swelling. IMPRESSION: 1. No acute findings. Electronically signed by: Norman Gatlin MD 11/05/2024 10:35 PM EST RP Workstation: [id number]   CT Angio Chest Pulmonary Embolism (PE) W or WO Contrast Result Date: 11/05/2024 CLINICAL  DATA:  Pulmonary embolism (PE) suspected, low to intermediate prob, positive D-dimer EXAM: CT ANGIOGRAPHY CHEST WITH CONTRAST TECHNIQUE: Multidetector CT imaging of the chest was performed using the standard protocol during bolus administration of intravenous contrast. Multiplanar CT image reconstructions and MIPs were obtained to evaluate the vascular anatomy. RADIATION DOSE REDUCTION: This exam was performed according to the departmental dose-optimization program which includes automated exposure control, adjustment of the mA and/or kV according to patient size and/or use of iterative reconstruction technique. CONTRAST:  75mL OMNIPAQUE  IOHEXOL  350 MG/ML SOLN COMPARISON:  11/05/2024, 07/25/2024 FINDINGS: Cardiovascular: This is a technically adequate evaluation of the pulmonary vasculature. No filling defects or pulmonary emboli. The heart is unremarkable without pericardial effusion. Normal caliber of the thoracic aorta. Mediastinum/Nodes: No enlarged mediastinal, hilar, or axillary lymph nodes. Thyroid  gland, trachea, and esophagus demonstrate no significant findings. Lungs/Pleura: Scattered areas of subsegmental atelectasis or scarring are seen bilaterally, greatest at the left lung base. No airspace disease, effusion, or pneumothorax. Central airways are patent. Upper Abdomen: No acute abnormality. Musculoskeletal: There are no acute displaced fractures. Stable posterior fusion across the thoracolumbar junction. Stable severe spondylosis at T9-T10. Reconstructed images demonstrate no additional findings. Review of the MIP images confirms the above findings. IMPRESSION: 1. No evidence of pulmonary embolus. 2. Scattered areas of subsegmental atelectasis or scarring as above. No acute airspace disease. Electronically Signed   By: Ozell Daring M.D.   On: 11/05/2024 15:57   VAS US  LOWER EXTREMITY VENOUS (DVT) Result Date: 11/05/2024  Lower Venous DVT Study Patient Name:  [name]  Date of Exam:    11/05/2024 Medical Rec #: [id number]        Accession #:    [id number] Date of Birth: 08/20/1961       Patient Gender: F Patient Age:   64 years Exam Location:  [facility] Procedure:      VAS US  LOWER EXTREMITY VENOUS (DVT) Referring Phys: NKEIRUKA EZENDUKA --------------------------------------------------------------------------------  Indications: Swelling, Edema, and recent fall.  Limitations: Body habitus, poor ultrasound/tissue interface and patient position and patient condition. Comparison Study: Previous study on 10.16.2025. Performing Technologist: Edilia Elden Appl  Examination Guidelines: A complete evaluation includes B-mode imaging, spectral Doppler, color Doppler, and power Doppler as needed of all accessible portions of each vessel. Bilateral testing is considered an integral part of a complete examination.  Limited examinations for reoccurring indications may be performed as noted. The reflux portion of the exam is performed with the patient in reverse Trendelenburg.  +---------+---------------+---------+-----------+----------+-------------------+ RIGHT    CompressibilityPhasicitySpontaneityPropertiesThrombus Aging      +---------+---------------+---------+-----------+----------+-------------------+ CFV      Full           Yes      Yes                                      +---------+---------------+---------+-----------+----------+-------------------+ SFJ      Full           Yes      Yes                                      +---------+---------------+---------+-----------+----------+-------------------+ FV Prox  Full                                                             +---------+---------------+---------+-----------+----------+-------------------+ FV Mid   Full                                                             +---------+---------------+---------+-----------+----------+-------------------+ FV Distal               Yes      Yes                   Patent by color and                                                       doppler.            +---------+---------------+---------+-----------+----------+-------------------+ PFV      Full                                                             +---------+---------------+---------+-----------+----------+-------------------+ POP      Full           Yes      Yes                                      +---------+---------------+---------+-----------+----------+-------------------+ PTV                                                   Not well visualized +---------+---------------+---------+-----------+----------+-------------------+ PERO  Not well visualized +---------+---------------+---------+-----------+----------+-------------------+ Technically difficult examination due to patient's condition, body habitus, pain and inability to properly position.  +---------+---------------+---------+-----------+----------+-------------------+ LEFT     CompressibilityPhasicitySpontaneityPropertiesThrombus Aging      +---------+---------------+---------+-----------+----------+-------------------+ CFV      Full           Yes      Yes                                      +---------+---------------+---------+-----------+----------+-------------------+ SFJ      Full           Yes      Yes                                      +---------+---------------+---------+-----------+----------+-------------------+ FV Prox  Full                                                             +---------+---------------+---------+-----------+----------+-------------------+ FV Mid   Full                                                             +---------+---------------+---------+-----------+----------+-------------------+ FV Distal               Yes      Yes                  Patent by color and                                                        doppler.            +---------+---------------+---------+-----------+----------+-------------------+ PFV      Full                                                             +---------+---------------+---------+-----------+----------+-------------------+ POP      Full           Yes      Yes                  Limited             +---------+---------------+---------+-----------+----------+-------------------+ PTV                                                   Not well visualized +---------+---------------+---------+-----------+----------+-------------------+ PERO  Not well visualized +---------+---------------+---------+-----------+----------+-------------------+ Technically difficult examination due to patient's condition, body habitus, pain and inability to properly position.    Summary: RIGHT: - There is no evidence of deep vein thrombosis in the lower extremity. However, portions of this examination were limited- see technologist comments above.  - No cystic structure found in the popliteal fossa.  LEFT: - There is no evidence of deep vein thrombosis in the lower extremity. However, portions of this examination were limited- see technologist comments above.  - No cystic structure found in the popliteal fossa.  *See table(s) above for measurements and observations. Electronically signed by Gaile New MD on 11/05/2024 at 3:31:10 PM.    Final    DG CHEST PORT 1 VIEW Result Date: 11/05/2024 EXAM: 1 VIEW(S) XRAY OF THE CHEST 11/05/2024 01:07:00 AM COMPARISON: Chest x-ray 07/22/2024, CT angio chest 07/25/2024. CLINICAL HISTORY: [id number] Fall [id number] Fall. FINDINGS: LUNGS AND PLEURA: Lingular linear atelectasis versus scarring. Increased interstitial markings. No pleural effusion. No pneumothorax. HEART AND MEDIASTINUM: No acute abnormality of the cardiac and mediastinal silhouettes. BONES AND SOFT  TISSUES: No acute osseous abnormality. Cervical spine surgical hardware. IMPRESSION: 1. No acute findings. Electronically signed by: Morgane Naveau MD 11/05/2024 01:14 AM EST RP Workstation: [id number]   DG HIP UNILAT WITH PELVIS 2-3 VIEWS RIGHT Result Date: 11/04/2024 EXAM: 2-3 VIEW(S) XRAY OF THE RIGHT HIP 11/04/2024 09:34:21 PM COMPARISON: None available. CLINICAL HISTORY: Fall; pain. FINDINGS: BONES AND JOINTS: There is mild osteopenia with no evidence of fracture or dislocation of the right hip. There is enthesopathic spurring of the right iliac wing and greater trochanter, mild nonerosive arthrosis of the right hip and symphysis pubis. The right SI joint is patent. There is postsurgical change in the lower lumbar spine. SOFT TISSUES: Soft tissues are unremarkable, with overlying clothing artifacts and monitor wires. IMPRESSION: 1. No acute fracture or dislocation of the right hip. 2. Osteopenia and degenerative change. Electronically signed by: Francis Quam MD 11/04/2024 10:14 PM EST RP Workstation: [id number]   DG HIP UNILAT WITH PELVIS 2-3 VIEWS LEFT Result Date: 11/04/2024 EXAM: 2 or 3 VIEW(S) XRAY OF THE LEFT HIP 11/04/2024 09:34:21 PM COMPARISON: None available. CLINICAL HISTORY: Fall; pain. FINDINGS: BONES AND JOINTS: There is mild osteopenia. No evidence of pelvic fracture or diastasis. No evidence of left hip fracture or dislocation. No malalignment. There is enthesopathic spurring of the iliac wings, spurring at the symphysis pubis, and mild nonerosive arthrosis of the hip joints. LUMBAR SPINE: There is fusion hardware in the lower lumbar spine. SOFT TISSUES: There are artifacts from overlying clothing. IMPRESSION: 1. No evidence of left hip or pelvic fracture or dislocation. 2. Osteopenia, degenerative and postsurgical changes. Electronically signed by: Francis Quam MD 11/04/2024 10:03 PM EST RP Workstation: [id number]   DG Lumbar Spine Complete Result Date: 11/04/2024 EXAM: 4+ VIEW(S)  XRAY OF THE LUMBAR SPINE 11/04/2024 09:32:16 PM COMPARISON: MRI lumbar spine 05/26/2024. CLINICAL HISTORY: Fall, pain. FINDINGS: LUMBAR SPINE: BONES: Vertebral body heights are maintained. 5 lumbar segments are noted. There is mild levoscoliosis with the apex at L3. Extensive fusion hardware is present, including posterior fusion rods and pedicle screws from T10 through L4, separate posterior fusion rods and L3 to L5 with pedicle screws at L3 and L5 , old anterior fusion hardware at L5-S1, and interbody disc space hardware from L2 through S1. Connecting struts are present at the L3 level between the upper and lower components of the fusion hardware. All hardware appears intact. There is up to 3 mm lucency around  the L1 pedicle screws and 2 mm lucency around the L5 pedicle screws, suggesting a degree of loosening. There is no other overt hardware loosening. The most recent lumbar CT was on 10/12/2021 prior to the placement of the T10 through L4 hardware. The lucency around the L5 pedicle screws was present at that time. No acute fracture is seen. DISCS AND DEGENERATIVE CHANGES: Fusions appear solid at L1-2 and L5-S1. The other disc spaces appear patent as well as can be seen. There is a chronic grade 1 anterolisthesis at L3-4 and L4-5 which is unchanged. Moderate marginal osteophytosis is also seen. Posterior decompression changes are present at L3-4 and L4-5. Both SI joints are patent. SOFT TISSUES: No acute abnormality. IMPRESSION: 1. No lumbar fracture is evident. 2. Extensive thoracolumbar fusion hardware appears intact, with lucency around the L1 and L5 pedicle screws suggesting loosening. 3. Degenerative changes and chronic grade 1 anterolisthesis at 2 levels. Electronically signed by: Francis Quam MD 11/04/2024 09:57 PM EST RP Workstation: [id number]   DG Thoracic Spine 2 View Result Date: 11/04/2024 EXAM: 2 VIEW(S) XRAY OF THE THORACIC SPINE 11/04/2024 09:32:16 PM COMPARISON: 06/29/2024 CLINICAL HISTORY:  fall, pain. Fall; pain. FINDINGS: LIMITATIONS: Limited evaluation due to overlapping osseous structures and overlying soft tissues. BONES: Vertebral body heights are maintained, except for chronic similar-appearing T9 and T10 compression fractures. Alignment is normal. Thoracolumbar surgical hardware starts at the T10 level. No surgical hardware fracture. DISCS AND DEGENERATIVE CHANGES: No severe degenerative changes. SOFT TISSUES: The visualized lungs are clear. IMPRESSION: 1. Thoracolumbar surgical hardware starting at the T10 level, without hardware fracture. 2. No acute findings. 3. Chronic similar-appearing T9 and T10 compression fractures. Electronically signed by: Kate Plummer MD 11/04/2024 09:53 PM EST RP Workstation: [id number]     Subjective: No acute issues or events overnight denies nausea vomiting diarrhea constipation any fevers chills or chest pain   Discharge Exam: Vitals:   11/10/24 0355 11/10/24 0718  BP: 113/69 134/61  Pulse: 84 78  Resp: 19 17  Temp: 98.8 F (37.1 C) 97.9 F (36.6 C)  SpO2: 100% (!) 89%   Vitals:   11/09/24 1556 11/09/24 2135 11/10/24 0355 11/10/24 0718  BP: (P) 131/80 (!) 150/72 113/69 134/61  Pulse:  76 84 78  Resp:  20 19 17   Temp: (P) 98.8 F (37.1 C) 98.5 F (36.9 C) 98.8 F (37.1 C) 97.9 F (36.6 C)  TempSrc: (P) Oral Oral Oral Oral  SpO2:  93% 100% (!) 89%  Weight:      Height:        General:  Pleasantly resting in bed, No acute distress. HEENT:  Normocephalic atraumatic.  Sclerae nonicteric, noninjected.  Extraocular movements intact bilaterally. Neck:  Without mass or deformity.  Trachea is midline. Lungs:  Clear to auscultate bilaterally without rhonchi, wheeze, or rales. Heart:  Regular rate and rhythm.  Without murmurs, rubs, or gallops. Abdomen:  Soft, nontender, nondistended.  Without guarding or rebound. Extremities: Without cyanosis, clubbing, edema, or obvious deformity. Skin:  Warm and dry, no erythema.   The  results of significant diagnostics from this hospitalization (including imaging, microbiology, ancillary and laboratory) are listed below for reference.     Microbiology: Recent Results (from the past 240 hours)  Culture, blood (Routine X 2) w Reflex to ID Panel     Status: None   Collection Time: 11/05/24  6:49 AM   Specimen: BLOOD  Result Value Ref Range Status   Specimen Description BLOOD BLOOD RIGHT ARM  Final   Special  Requests   Final    BOTTLES DRAWN AEROBIC AND ANAEROBIC Blood Culture adequate volume   Culture   Final    NO GROWTH 5 DAYS Performed at [facility], [address], [place], [name] [address]    Report Status 11/10/2024 FINAL  Final  Culture, blood (Routine X 2) w Reflex to ID Panel     Status: None   Collection Time: 11/05/24  8:44 AM   Specimen: BLOOD  Result Value Ref Range Status   Specimen Description BLOOD BLOOD RIGHT HAND  Final   Special Requests   Final    BOTTLES DRAWN AEROBIC AND ANAEROBIC Blood Culture results may not be optimal due to an inadequate volume of blood received in culture bottles   Culture   Final    NO GROWTH 5 DAYS Performed at [facility], [address] [place], [name] [address]    Report Status 11/10/2024 FINAL  Final     Labs: BNP (last 3 results) Recent Labs    07/21/24 1532  BNP 239.4*   Basic Metabolic Panel: Recent Labs  Lab 11/04/24 2102 11/05/24 0615 11/06/24 0215 11/08/24 0113 11/09/24 0231 11/10/24 0225  NA 143 143 144  --  145 140  K 4.1 3.8 3.8 3.4* 3.6 3.5  CL 105 107 108  --  107 103  CO2 22 26 26   --  28 27  GLUCOSE 62* 132* 87  --  93 142*  BUN 10 8 8   --  12 17  CREATININE 0.44 0.50 0.50  --  0.55 0.58  CALCIUM  9.4 8.5* 8.5*  --  9.1 9.1  MG  --  1.9  --  1.7  --   --   PHOS  --  3.2  3.2  --   --   --   --    Liver Function Tests: Recent Labs  Lab 11/04/24 2102 11/05/24 0615  AST 103* 86*  ALT 43 37  ALKPHOS 172* 133*  BILITOT <0.2 <0.2  PROT 6.4* 5.1*  ALBUMIN  3.8  3.1*   No results for input(s): LIPASE, AMYLASE in the last 168 hours. No results for input(s): AMMONIA in the last 168 hours. CBC: Recent Labs  Lab 11/04/24 2102 11/05/24 0615 11/06/24 0215  WBC 10.3 7.6 7.5  NEUTROABS  --   --  3.9  HGB 11.8* 9.9* 9.7*  HCT 39.4 32.8* 32.1*  MCV 81.1 80.4 80.3  PLT 304 279 276   Cardiac Enzymes: Recent Labs  Lab 11/04/24 2102 11/05/24 0615 11/06/24 0215 11/07/24 0118 11/08/24 0113  CKTOTAL 2,259* 1,645* 1,080* 598* 267*   CBG: Recent Labs  Lab 11/09/24 0611 11/09/24 1236 11/09/24 1600 11/09/24 2132 11/10/24 0601  GLUCAP 85 93 137* 93 82   Urinalysis    Component Value Date/Time   COLORURINE YELLOW 11/04/2024 2300   APPEARANCEUR HAZY (A) 11/04/2024 2300   LABSPEC 1.015 11/04/2024 2300   PHURINE 5.0 11/04/2024 2300   GLUCOSEU NEGATIVE 11/04/2024 2300   HGBUR NEGATIVE 11/04/2024 2300   BILIRUBINUR NEGATIVE 11/04/2024 2300   BILIRUBINUR NEG 12/30/2013 1603   KETONESUR 80 (A) 11/04/2024 2300   PROTEINUR NEGATIVE 11/04/2024 2300   UROBILINOGEN 0.2 12/30/2013 1603   UROBILINOGEN 0.2 04/09/2012 0649   NITRITE NEGATIVE 11/04/2024 2300   LEUKOCYTESUR NEGATIVE 11/04/2024 2300   Sepsis Labs Recent Labs  Lab 11/04/24 2102 11/05/24 0615 11/06/24 0215  WBC 10.3 7.6 7.5   Microbiology Recent Results (from the past 240 hours)  Culture, blood (  Routine X 2) w Reflex to ID Panel     Status: None   Collection Time: 11/05/24  6:49 AM   Specimen: BLOOD  Result Value Ref Range Status   Specimen Description BLOOD BLOOD RIGHT ARM  Final   Special Requests   Final    BOTTLES DRAWN AEROBIC AND ANAEROBIC Blood Culture adequate volume   Culture   Final    NO GROWTH 5 DAYS Performed at [facility], [address], [place], [name] [address]    Report Status 11/10/2024 FINAL  Final  Culture, blood (Routine X 2) w Reflex to ID Panel     Status: None   Collection Time: 11/05/24  8:44 AM   Specimen: BLOOD  Result Value Ref  Range Status   Specimen Description BLOOD BLOOD RIGHT HAND  Final   Special Requests   Final    BOTTLES DRAWN AEROBIC AND ANAEROBIC Blood Culture results may not be optimal due to an inadequate volume of blood received in culture bottles   Culture   Final    NO GROWTH 5 DAYS Performed at [facility], [address], [place], [name] [address]    Report Status 11/10/2024 FINAL  Final     Time coordinating discharge: Over 30 minutes  SIGNED:   Elsie JAYSON [place], DO [facility] 11/10/2024, 10:46 AM Pager   If 7PM-7AM, please contact night-coverage [email or web address]     [1]  Allergies Allergen Reactions   Monosodium Glutamate Anaphylaxis and Swelling    Eyes swollen shut, facial swelling, tongue swelling.   Shellfish Allergy Anaphylaxis   Melatonin Other (See Comments)    Muscle spasms involuntary movement   Baclofen Nausea Only    Dizziness and increase muscle spasm   Celebrex  [Celecoxib ] Itching    Only allergic to generic brand   Ciprofloxacin  Other (See Comments)    inability to walk/Unable to walk   Contrast Media [Iodinated Contrast Media] Itching and Nausea Only    could not walk   Diclofenac  Itching    Generic Diclefenac gel causes itching. Can take the name brand Voltaren  gel   Gadolinium     Allergic to MRI contrast dye per patient.   Molds & Smuts Other (See Comments)    Causes allergies to flair up/sinus issues/headaches     Other Other (See Comments)    Pet dander,        Promethazine  Itching   Statins Other (See Comments)    pain   Sulfa  Antibiotics Other (See Comments)    Rash, migraine    Latex Itching and Rash    Latex glove with powder; bite blocks used for EGD studies
